# Patient Record
Sex: Male | Born: 1990 | Race: Black or African American | Hispanic: No | Marital: Single | Smoking: Former smoker
Health system: Southern US, Community
[De-identification: ages and names within clinical notes are randomized; demographics above are authoritative.]

## PROBLEM LIST (undated history)

## (undated) DIAGNOSIS — F329 Major depressive disorder, single episode, unspecified: Secondary | ICD-10-CM

## (undated) DIAGNOSIS — M90512 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, left shoulder: Secondary | ICD-10-CM

## (undated) DIAGNOSIS — D571 Sickle-cell disease without crisis: Secondary | ICD-10-CM

## (undated) DIAGNOSIS — F32A Depression, unspecified: Secondary | ICD-10-CM

## (undated) DIAGNOSIS — E559 Vitamin D deficiency, unspecified: Secondary | ICD-10-CM

## (undated) HISTORY — PX: CHOLECYSTECTOMY: SHX55

---
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
## 2011-04-22 DIAGNOSIS — D582 Other hemoglobinopathies: Secondary | ICD-10-CM | POA: Insufficient documentation

## 2011-04-22 DIAGNOSIS — T8089XA Other complications following infusion, transfusion and therapeutic injection, initial encounter: Secondary | ICD-10-CM | POA: Insufficient documentation

## 2012-12-17 ENCOUNTER — Telehealth: Payer: Self-pay

## 2012-12-17 NOTE — Telephone Encounter (Signed)
This CM spoke with Mr. Timothy Marks regarding the status of his request to become a new patient. Mr. Timothy Marks stated he does not have a PCP and needs a PCP. Mr. Timothy Marks states he has the new pt packet from 07/2012 and will complete  and send it to our office.   Karoline Caldwell, RN, BSN, [place]   [phone]

## 2013-01-24 ENCOUNTER — Inpatient Hospital Stay (HOSPITAL_COMMUNITY)
Admission: EM | Admit: 2013-01-24 | Discharge: 2013-01-25 | DRG: 812 | Disposition: A | Discharge status: Home / Self Care | Payer: Medicaid Other | Attending: Internal Medicine | Admitting: Internal Medicine

## 2013-01-24 ENCOUNTER — Encounter (HOSPITAL_COMMUNITY): Payer: Self-pay | Admitting: Emergency Medicine

## 2013-01-24 ENCOUNTER — Emergency Department (HOSPITAL_COMMUNITY): Payer: Medicaid Other

## 2013-01-24 DIAGNOSIS — D5700 Hb-SS disease with crisis, unspecified: Principal | ICD-10-CM | POA: Diagnosis present

## 2013-01-24 DIAGNOSIS — R4689 Other symptoms and signs involving appearance and behavior: Secondary | ICD-10-CM | POA: Diagnosis present

## 2013-01-24 HISTORY — DX: Sickle-cell disease without crisis: D57.1

## 2013-01-24 LAB — CBC WITH DIFFERENTIAL/PLATELET
Basophils Absolute: 0 10*3/uL (ref 0.0–0.1)
Basophils Relative: 0 % (ref 0–1)
Eosinophils Absolute: 0.4 10*3/uL (ref 0.0–0.7)
Eosinophils Relative: 3 % (ref 0–5)
HCT: 28 % — ABNORMAL LOW (ref 39.0–52.0)
Hemoglobin: 10.2 g/dL — ABNORMAL LOW (ref 13.0–17.0)
Lymphocytes Relative: 19 % (ref 12–46)
Lymphs Abs: 2.4 10*3/uL (ref 0.7–4.0)
MCH: 27.6 pg (ref 26.0–34.0)
MCHC: 36.4 g/dL — ABNORMAL HIGH (ref 30.0–36.0)
MCV: 75.7 fL — ABNORMAL LOW (ref 78.0–100.0)
Monocytes Absolute: 1.3 10*3/uL — ABNORMAL HIGH (ref 0.1–1.0)
Monocytes Relative: 10 % (ref 3–12)
Neutro Abs: 8.5 10*3/uL — ABNORMAL HIGH (ref 1.7–7.7)
Neutrophils Relative %: 68 % (ref 43–77)
Platelets: 386 10*3/uL (ref 150–400)
RBC: 3.7 MIL/uL — ABNORMAL LOW (ref 4.22–5.81)
RDW: 22.5 % — ABNORMAL HIGH (ref 11.5–15.5)
WBC: 12.6 10*3/uL — ABNORMAL HIGH (ref 4.0–10.5)

## 2013-01-24 LAB — COMPREHENSIVE METABOLIC PANEL
ALT: 18 U/L (ref 0–53)
AST: 44 U/L — ABNORMAL HIGH (ref 0–37)
Albumin: 4.3 g/dL (ref 3.5–5.2)
Alkaline Phosphatase: 74 U/L (ref 39–117)
BUN: 9 mg/dL (ref 6–23)
CO2: 25 mEq/L (ref 19–32)
Calcium: 9.2 mg/dL (ref 8.4–10.5)
Chloride: 102 mEq/L (ref 96–112)
Creatinine, Ser: 0.63 mg/dL (ref 0.50–1.35)
GFR calc Af Amer: >90 mL/min (ref >90)
GFR calc non Af Amer: >90 mL/min (ref >90)
Glucose, Bld: 89 mg/dL (ref 70–99)
Potassium: 4.5 mEq/L (ref 3.5–5.1)
Sodium: 136 mEq/L (ref 135–145)
Total Bilirubin: 4.7 mg/dL — ABNORMAL HIGH (ref 0.3–1.2)
Total Protein: 7.8 g/dL (ref 6.0–8.3)

## 2013-01-24 LAB — URINE MICROSCOPIC-ADD ON

## 2013-01-24 LAB — URINALYSIS, ROUTINE W REFLEX MICROSCOPIC
Bilirubin Urine: NEGATIVE
Glucose, UA: NEGATIVE mg/dL
Ketones, ur: NEGATIVE mg/dL
Leukocytes, UA: NEGATIVE
Nitrite: NEGATIVE
Protein, ur: NEGATIVE mg/dL
Specific Gravity, Urine: 1.007 (ref 1.005–1.030)
Urobilinogen, UA: 1 mg/dL (ref 0.0–1.0)
pH: 6.5 (ref 5.0–8.0)

## 2013-01-24 LAB — RETICULOCYTES
RBC.: 3.7 MIL/uL — ABNORMAL LOW (ref 4.22–5.81)
Retic Count, Absolute: 307.1 10*3/uL — ABNORMAL HIGH (ref 19.0–186.0)
Retic Ct Pct: 8.3 % — ABNORMAL HIGH (ref 0.4–3.1)

## 2013-01-24 LAB — TSH: TSH: 2.299 u[IU]/mL (ref 0.350–4.500)

## 2013-01-24 MED ORDER — FOLIC ACID 1 MG PO TABS
1.0000 mg | ORAL_TABLET | Freq: Every day | ORAL | Status: DC
  Administered 2013-01-24: 1 mg via ORAL
  Filled 2013-01-24 (×2): qty 1

## 2013-01-24 MED ORDER — OXYCODONE-ACETAMINOPHEN 5-325 MG PO TABS
2.0000 | ORAL_TABLET | ORAL | Status: DC | PRN
  Filled 2013-01-24: qty 2

## 2013-01-24 MED ORDER — ONDANSETRON HCL 4 MG PO TABS: 4.0000 mg | ORAL_TABLET | Freq: Four times a day (QID) | ORAL | Status: DC | PRN

## 2013-01-24 MED ORDER — SODIUM CHLORIDE 0.9 % IV BOLUS (SEPSIS)
1000.0000 mL | Freq: Once | INTRAVENOUS | Status: AC
  Administered 2013-01-24: 1000 mL via INTRAVENOUS

## 2013-01-24 MED ORDER — ONDANSETRON HCL 4 MG/2ML IJ SOLN: 4.0000 mg | Freq: Four times a day (QID) | INTRAMUSCULAR | Status: DC | PRN

## 2013-01-24 MED ORDER — HYDROMORPHONE HCL PF 1 MG/ML IJ SOLN
1.0000 mg | Freq: Once | INTRAMUSCULAR | Status: AC
  Administered 2013-01-24: 1 mg via INTRAVENOUS
  Filled 2013-01-24: qty 1

## 2013-01-24 MED ORDER — SODIUM CHLORIDE 0.9 % IV SOLN
25.0000 mg | INTRAVENOUS | Status: DC | PRN
  Filled 2013-01-24: qty 0.5

## 2013-01-24 MED ORDER — HYDROMORPHONE HCL PF 1 MG/ML IJ SOLN
1.0000 mg | INTRAMUSCULAR | Status: DC | PRN
  Administered 2013-01-24: 1 mg via INTRAVENOUS
  Filled 2013-01-24: qty 1

## 2013-01-24 MED ORDER — SODIUM CHLORIDE 0.9 % IV SOLN: INTRAVENOUS | Status: DC

## 2013-01-24 MED ORDER — ONDANSETRON HCL 4 MG/2ML IJ SOLN
4.0000 mg | Freq: Once | INTRAMUSCULAR | Status: AC
  Administered 2013-01-24: 4 mg via INTRAVENOUS
  Filled 2013-01-24: qty 2

## 2013-01-24 MED ORDER — ONDANSETRON HCL 4 MG/2ML IJ SOLN: 4.0000 mg | Freq: Three times a day (TID) | INTRAMUSCULAR | Status: DC | PRN

## 2013-01-24 MED ORDER — DIPHENHYDRAMINE HCL 12.5 MG/5ML PO ELIX
12.5000 mg | ORAL_SOLUTION | Freq: Four times a day (QID) | ORAL | Status: DC | PRN
  Filled 2013-01-24: qty 5

## 2013-01-24 MED ORDER — DIPHENHYDRAMINE HCL 50 MG/ML IJ SOLN: 12.5000 mg | Freq: Four times a day (QID) | INTRAMUSCULAR | Status: DC | PRN

## 2013-01-24 MED ORDER — SODIUM CHLORIDE 0.9 % IJ SOLN: 9.0000 mL | INTRAMUSCULAR | Status: DC | PRN

## 2013-01-24 MED ORDER — HYDROMORPHONE HCL PF 2 MG/ML IJ SOLN
2.0000 mg | Freq: Once | INTRAMUSCULAR | Status: AC
  Administered 2013-01-24: 2 mg via INTRAVENOUS
  Filled 2013-01-24: qty 1

## 2013-01-24 MED ORDER — SODIUM CHLORIDE 0.9 % IV SOLN
Freq: Once | INTRAVENOUS | Status: AC
Start: 2013-01-24 — End: 2013-01-24
  Administered 2013-01-24: 13:00:00 via INTRAVENOUS

## 2013-01-24 MED ORDER — OXYCODONE-ACETAMINOPHEN 5-325 MG PO TABS
2.0000 | ORAL_TABLET | Freq: Once | ORAL | Status: AC
  Administered 2013-01-24: 2 via ORAL

## 2013-01-24 MED ORDER — NALOXONE HCL 0.4 MG/ML IJ SOLN: 0.4000 mg | INTRAMUSCULAR | Status: DC | PRN

## 2013-01-24 MED ORDER — PROMETHAZINE HCL 25 MG/ML IJ SOLN: 25.0000 mg | Freq: Once | INTRAMUSCULAR | Status: DC

## 2013-01-24 MED ORDER — HYDROMORPHONE 0.3 MG/ML IV SOLN
INTRAVENOUS | Status: DC
  Administered 2013-01-24: 20:00:00 via INTRAVENOUS
  Administered 2013-01-25: 2.19 mg via INTRAVENOUS
  Administered 2013-01-25: 0.799 mg via INTRAVENOUS
  Filled 2013-01-24: qty 25

## 2013-01-24 MED ORDER — SODIUM CHLORIDE 0.45 % IV SOLN
INTRAVENOUS | Status: DC
  Administered 2013-01-24: 1000 mL via INTRAVENOUS
  Administered 2013-01-25: via INTRAVENOUS

## 2013-01-24 MED ORDER — ENOXAPARIN SODIUM 40 MG/0.4ML ~~LOC~~ SOLN
40.0000 mg | SUBCUTANEOUS | Status: DC
  Administered 2013-01-24: 40 mg via SUBCUTANEOUS
  Filled 2013-01-24 (×2): qty 0.4

## 2013-01-24 NOTE — Progress Notes (Signed)
NURSING PROGRESS NOTE  [name] [id number] Admission Data: 01/24/2013 5:59 PM Attending Provider: Kathlen Mody, MD PCP:No primary provider on file. Code Status: full  Timothy Marks is a 22 y.o. male patient admitted from ED:  -No acute distress noted.  -No complaints of shortness of breath.  -No complaints of chest pain.    Blood pressure 132/67, pulse 77, temperature 98.4 F (36.9 C), temperature source Oral, resp. rate 20, height 6\' 2"  (1.88 m), weight 58.695 kg (129 lb 6.4 oz), SpO2 94.00%.   IV Fluids:  IV in place, occlusive dsg intact without redness, IV cath upper arm right, condition patent and no redness 1/2 normal saline @125 .   Allergies:  Toradol and Tramadol  Past Medical History:   has a past medical history of Sickle cell anemia.  Past Surgical History:   has no past surgical history on file.  Social History:   reports that he has never smoked. He has never used smokeless tobacco. He reports that he does not drink alcohol or use illicit drugs.  Skin: tattoos, cold sore on lip  Patient/Family orientated to room. Information packet given to patient/family. Admission inpatient armband information verified with patient/family to include name and date of birth and placed on patient arm. Side rails up x 2, fall assessment and education completed with patient/family. Patient/family able to verbalize understanding of risk associated with falls and verbalized understanding to call for assistance before getting out of bed. Call light within reach. Patient/family able to voice and demonstrate understanding of unit orientation instructions.    Will continue to evaluate and treat per MD orders.

## 2013-01-24 NOTE — ED Notes (Signed)
Family at bedside. 

## 2013-01-24 NOTE — Progress Notes (Signed)
[facility] hospital[facility] progress note. Chief complaint. Chest pain. History of present illness. This 22 year old male admitted with sickle cell crisis. No chest pain at time of admission. On arrival to the floor he complained of central chest pain to nursing. There is no associated diaphoresis, radiation of the pain, or associated nausea. Patient denies any prior known history of coronary artery disease. A 12-lead EKG was obtained the bedside notes normal sinus rhythm without indication of ischemia. Patient does indicate that chest pain is not uncommon with his sickle cell crisis. Vital signs. Temperature 98.4, pulse 79, respiration 18, blood pressure 120/68. O2 sats 98%. General appearance. Thin young male who is alert, cooperative, in no distress. Cardiac. Rate and rhythm regular. Lungs. Breath sounds clear and equal. Abdomen. Soft with positive bowel sounds. No pain. Musculoskeletal. Reproducible chest pain with palpation over the sternal area. Impression/plan. Problem #1. Chest pain. I suspect this is musculoskeletal/sickle cell type pain rather than pain of cardiac origin. Patient's EKG looks unremarkable. There is no indication of ischemia seen. Will continue with ordered analgesics for sickle cell crisis. Will check a troponin now and then every 6 hours for a total of 3 sets and follow for these results.

## 2013-01-24 NOTE — ED Provider Notes (Signed)
CSN: [id number]     Arrival date & time 01/24/13  4098 History   First MD Initiated Contact with Patient 01/24/13 [phone]     Chief Complaint  Patient presents with  . Sickle Cell Pain Crisis   (Consider location/radiation/quality/duration/timing/severity/associated sxs/prior Treatment) HPI Comments: The patient is a 22 year old male with a past medical history of sickle cell anemia that presents to the ED with sickle cell pain since last night. The patient reports 10/10 pain. He reports a gradual onset of upper extremity, bilateral lower extremity pain, and back pain. The patient states similar to sickle cell pain in the past. He reports ibuprofen and warm compresses with minimal relief. Last hospitalization was October 2014 in [facility].  The patient is unsure what his baseline Hbg is.  He denies chest pain, cough dyspnea, fever or chills, abdominal pain, nausea or vomiting.  Reports [place] is managed by "[facility]".  The history is provided by the patient and medical records. No language interpreter was used.    Past Medical History  Diagnosis Date  . Sickle cell anemia    History reviewed. No pertinent past surgical history. No family history on file. History  Substance Use Topics  . Smoking status: Never Smoker   . Smokeless tobacco: Not on file  . Alcohol Use: No    Review of Systems  Constitutional: Negative for fever and chills.  Respiratory: Negative for cough, chest tightness, shortness of breath and wheezing.   Cardiovascular: Negative for chest pain, palpitations and leg swelling.  Gastrointestinal: Negative for nausea, abdominal pain, diarrhea and constipation.  Musculoskeletal: Positive for arthralgias, back pain and joint swelling.  Skin: Negative for rash.  Neurological: Negative for light-headedness and headaches.  All other systems reviewed and are negative.    Allergies  Toradol and Tramadol  Home Medications   Current Outpatient Rx   Name  Route  Sig  Dispense  Refill  . folic acid (FOLVITE) 1 MG tablet   Oral   Take 1 mg by mouth daily.         . hydroxyurea (HYDREA) 500 MG capsule   Oral   Take 500 mg by mouth daily. May take with food to minimize GI side effects.         Marland Kitchen oxyCODONE-acetaminophen (PERCOCET) 10-325 MG per tablet   Oral   Take 1 tablet by mouth every 4 (four) hours as needed for pain.          BP 127/62  Pulse 81  Temp(Src) 98 F (36.7 C) (Oral)  Resp 22  SpO2 96% Physical Exam  Nursing note and vitals reviewed. Constitutional: He is oriented to person, place, and time. He appears well-developed and well-nourished.  HENT:  Head: Normocephalic and atraumatic.  Eyes: Scleral icterus is present.  Neck: Normal range of motion. Neck supple.  Cardiovascular: Normal rate and regular rhythm.   Pulmonary/Chest: Effort normal and breath sounds normal. No respiratory distress. He has no wheezes. He has no rales. He exhibits no tenderness.  Abdominal: Soft. Bowel sounds are normal. He exhibits no distension. There is no tenderness. There is no rebound and no guarding.  Well healed small midsagittal linear scar and umbilical scar.  Musculoskeletal: Normal range of motion.  Upper extremity pain with palpation, bilateral knees and lower extremities tender to palpation.  Low back tender to palpation.  Neurological: He is alert and oriented to person, place, and time.  Skin: Skin is warm and dry. No  rash noted.    ED Course  Procedures (including critical care time) Labs Review Labs Reviewed  CBC WITH DIFFERENTIAL - Abnormal; Notable for the following:    WBC 12.6 (*)    RBC 3.70 (*)    Hemoglobin 10.2 (*)    HCT 28.0 (*)    MCV 75.7 (*)    MCHC 36.4 (*)    RDW 22.5 (*)    Neutro Abs 8.5 (*)    Monocytes Absolute 1.3 (*)    All other components within normal limits  COMPREHENSIVE METABOLIC PANEL - Abnormal; Notable for the following:    AST 44 (*)    Total Bilirubin 4.7 (*)    All  other components within normal limits  RETICULOCYTES - Abnormal; Notable for the following:    Retic Ct Pct 8.3 (*)    RBC. 3.70 (*)    Retic Count, Manual 307.1 (*)    All other components within normal limits   Imaging Review Dg Chest 2 View  01/24/2013   CLINICAL DATA:  Pain; sickle cell disease  EXAM: CHEST  2 VIEW  COMPARISON:  January 20, 2013  FINDINGS: Lungs are clear. Heart size and pulmonary vascularity are normal. No adenopathy. No bone lesions.  IMPRESSION: No abnormality noted.   Electronically Signed   By: Bretta Bang M.D.   On: 01/24/2013 11:00    EKG Interpretation   None       MDM   1. Sickle cell anemia with pain    Pt with a history of [place] complains of arthralgias, denies Acute chest symptoms. Labs, fluid, pain medication ordered.  Per EMR at outside records: Baseline Hgb: 9.5 g/dl Baseline WBC: 40,981 Baseline reticulocyte count: 9%  Re-eval: patient texting and watching videos on cell phone.  Requesting more pain medication, oxygen saturation 95% while evaluating the patient. No acute abnormality on Chest-XR.  Re-eval patient is talking on the phone in the room discussed XR results with patient.  Patient is resting comfortably in room watching videos on his phone requesting gatorade.  He states he is still having pain in his back and extremities. He is requesting to be admitted for his pain at this time.  Per [facility] records the patient is no longer able to receive Schedule II medications due to testing positive for marijuana on a urine drug screen.  Hospitalist to admit for further management of his pain.   Meds given in ED:  Medications  diphenhydrAMINE (BENADRYL) 25 mg in sodium chloride 0.9 % 50 mL IVPB (not administered)  sodium chloride 0.9 % bolus 1,000 mL (0 mLs Intravenous Stopped 01/24/13 1019)  HYDROmorphone (DILAUDID) injection 2 mg (2 mg Intravenous Given 01/24/13 0936)  ondansetron (ZOFRAN) injection 4 mg (4 mg Intravenous Given 01/24/13 0937)   sodium chloride 0.9 % bolus 1,000 mL (0 mLs Intravenous Stopped 01/24/13 1236)  HYDROmorphone (DILAUDID) injection 1 mg (1 mg Intravenous Given 01/24/13 1134)  sodium chloride 0.9 % bolus 1,000 mL (0 mLs Intravenous Stopped 01/24/13 1234)  HYDROmorphone (DILAUDID) injection 1 mg (1 mg Intravenous Given 01/24/13 1233)  0.9 %  sodium chloride infusion ( Intravenous Stopped 01/24/13 1347)    New Prescriptions   No medications on file      Clabe Seal, PA-C 01/24/13 1434

## 2013-01-24 NOTE — H&P (Signed)
[facility] History and Physical  Normand Damron WUJ:[id number] DOB: September 17, 1990 DOA: 01/24/2013  Referring physician: EDP PCP: No primary provider on file.   Chief Complaint: lower extremity pain since yesterday.   HPI: Timothy Marks is a 22 y.o. male with h/o sickle cell anemia, used to follow up at [facility], until 2 months ago, wanted to find PCP in [place], went to [facility] and filled out paperwork to get in to clinic 3 weeks ago, came in today for worsening lower extremity pain snce yesterday, associated with pain in the lower back and arms, . No chest pain, sob, cough, fever or chills or syncope, headache or dizziness. His pain was not controlled with dilaudid in ED, and he was referred to admission to medical service for management of sickle cell pain crisis.   Review of Systems: Systems reviewed.  As above, otherwise negative  Past Medical History  Diagnosis Date  . Sickle cell anemia    History reviewed. No pertinent past surgical history. Social History:  reports that he has never smoked. He does not have any smokeless tobacco history on file. He reports that he does not drink alcohol or use illicit drugs.  Allergies  Allergen Reactions  . Toradol [Ketorolac Tromethamine] Itching  . Tramadol Hives    No family history on file.   Prior to Admission medications   Medication Sig Start Date End Date Taking? Authorizing Provider  folic acid (FOLVITE) 1 MG tablet Take 1 mg by mouth daily.   Yes Historical Provider, MD  hydroxyurea (HYDREA) 500 MG capsule Take 500 mg by mouth daily. May take with food to minimize GI side effects.   Yes Historical Provider, MD  oxyCODONE-acetaminophen (PERCOCET) 10-325 MG per tablet Take 1 tablet by mouth every 4 (four) hours as needed for pain.   Yes Historical Provider, MD   Physical Exam: Filed Vitals:   01/24/13 1549  BP: 132/67  Pulse: 77  Temp: 98.4 F (36.9 C)  Resp: 20    BP 132/67  Pulse 77  Temp(Src)  98.4 F (36.9 C) (Oral)  Resp 20  Ht 6\' 2"  (1.88 m)  Wt 58.695 kg (129 lb 6.4 oz)  BMI 16.61 kg/m2  SpO2 94%           Constitutional: Vital signs reviewed.  Patient is a well-developed and well-nourished in no acute distress and cooperative with exam. Alert and oriented x3.  Head: Normocephalic and atraumatic  Mouth: no erythema or exudates, MMM Eyes: PERRL, EOMI, conjunctivae normal, No scleral icterus.  Neck: Supple, Trachea midline normal ROM, No JVD, mass, thyromegaly, or carotid bruit present.  Cardiovascular: RRR, S1 normal, S2 normal, no MRG, pulses symmetric and intact bilaterally Pulmonary/Chest: normal respiratory effort, CTAB, no wheezes, rales, or rhonchi Abdominal: Soft. Non-tender, non-distended, bowel sounds are normal, no masses, organomegaly, or guarding present.  Musculoskeletal: No joint deformities, erythema, or stiffness, ROM painful  and tender Neurological: A&O x3, Strength is normal and symmetric bilaterally, cranial nerve II-XII are grossly intact, no focal motor deficit, sensory intact to light touch bilaterally.  Skin: Warm, dry and intact. No rash, cyanosis, or clubbing.  Psychiatric: Normal mood and affect. speech and behavior is normal.  Labs on Admission:  Basic Metabolic Panel:  Recent Labs Lab 01/24/13 0924  NA 136  K 4.5  CL 102  CO2 25  GLUCOSE 89  BUN 9  CREATININE 0.63  CALCIUM 9.2   Liver Function Tests:  Recent Labs Lab 01/24/13 0924  AST 44*  ALT 18  ALKPHOS 74  BILITOT 4.7*  PROT 7.8  ALBUMIN 4.3   No results found for this basename: LIPASE, AMYLASE,  in the last 168 hours No results found for this basename: AMMONIA,  in the last 168 hours CBC:  Recent Labs Lab 01/24/13 0924  WBC 12.6*  NEUTROABS 8.5*  HGB 10.2*  HCT 28.0*  MCV 75.7*  PLT 386   Cardiac Enzymes: No results found for this basename: CKTOTAL, CKMB, CKMBINDEX, TROPONINI,  in the last 168 hours  BNP (last 3 results) No results found for this  basename: PROBNP,  in the last 8760 hours CBG: No results found for this basename: GLUCAP,  in the last 168 hours  Radiological Exams on Admission: Dg Chest 2 View  01/24/2013   CLINICAL DATA:  Pain; sickle cell disease  EXAM: CHEST  2 VIEW  COMPARISON:  January 20, 2013  FINDINGS: Lungs are clear. Heart size and pulmonary vascularity are normal. No adenopathy. No bone lesions.  IMPRESSION: No abnormality noted.   Electronically Signed   By: Bretta Bang M.D.   On: 01/24/2013 11:00    EKG: not done  Assessment/Plan Active Problems:   Sickle cell anemia with pain   Sickle cell pain crisis   Sickle cell pain crisis: - admitted to med surg bed  - started pt on IV fluids, anti emetics, and PCA dilaudid.    Leukocytosis: - CXR neg for infection. - UA ordered.   Anemia: his H&h is 10.2. Will hold hydroxyurea .    DVT prophylaxis.   Code Status: full code Family Communication: discussed the plan of care with pt. Disposition Plan: admitted to inpatient  Time spent: 38  [facility] Pager [phone]

## 2013-01-24 NOTE — ED Provider Notes (Signed)
Medical screening examination/treatment/procedure(s) were conducted as a shared visit with non-physician practitioner(s) and myself.  I personally evaluated the patient during the encounter.  Typical sickle cell pain in arms, legs, low back.  No fever, chest pain, SOB.   FROM joints without pain . No warmth or erythema.  EKG Interpretation   None         Glynn Octave, MD 01/24/13 1529

## 2013-01-24 NOTE — Progress Notes (Signed)
Patient reported that he was having pain near his heart area. RN went in and assessed pain- pt pointed to his mid chest and stated that it was hurting there. RN called NP, Claiborne Billings who ordered a STAT ekg be done. Will continue monitor patient

## 2013-01-24 NOTE — ED Notes (Signed)
Patient states sickle cell crisis x 1 day, patient states generalized body aches and pain

## 2013-01-25 DIAGNOSIS — F6089 Other specific personality disorders: Secondary | ICD-10-CM | POA: Insufficient documentation

## 2013-01-25 DIAGNOSIS — F603 Borderline personality disorder: Secondary | ICD-10-CM

## 2013-01-25 DIAGNOSIS — R4689 Other symptoms and signs involving appearance and behavior: Secondary | ICD-10-CM | POA: Diagnosis present

## 2013-01-25 LAB — TROPONIN I: Troponin I: <0.3 ng/mL (ref <0.30)

## 2013-01-25 MED ORDER — OXYCODONE-ACETAMINOPHEN 5-325 MG PO TABS
1.0000 | ORAL_TABLET | ORAL | Status: DC | PRN
  Administered 2013-01-25: 1 via ORAL
  Filled 2013-01-25: qty 1

## 2013-01-25 MED ORDER — OXYCODONE-ACETAMINOPHEN 10-325 MG PO TABS: 1.0000 | ORAL_TABLET | ORAL | Status: DC | PRN

## 2013-01-25 MED ORDER — HYDROXYUREA 500 MG PO CAPS: 500.0000 mg | ORAL_CAPSULE | Freq: Every day | ORAL | Status: DC

## 2013-01-25 MED ORDER — OXYCODONE HCL 5 MG PO TABS
5.0000 mg | ORAL_TABLET | ORAL | Status: DC | PRN
  Administered 2013-01-25: 5 mg via ORAL
  Filled 2013-01-25 (×2): qty 1

## 2013-01-25 NOTE — Progress Notes (Signed)
I was present while primary nurse went over discharge paperwork. Patient refused to listen to instructions and was only concerned on how many pills he was getting of narcotics. After he was given the prescription he demanded to see his doctor. Dr Arthor Captain notified and seen patient. Advised patient that he needs a primary care physician in order to continue to receive his pain medications at home. Patient has had sickle cell since age 22 per patient. States he is in the middle of changing doctors that manage his sickle cell. Madelin Rear, MSN, RN, [employer]

## 2013-01-25 NOTE — Progress Notes (Signed)
Patient stated to RN that he did want to be in a room that had cameras. Patient stated that cameras made him feel uncomfortable and he did not want to be watched by any of the staff. RN reassured patient that no one would be looking at him 24/7 and that camera rooms were usually used for safety reasons. Patient asked if he could get something to cover the cameras with but RN told patient that was not appropriate. Will continue to monitor.
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
## 2013-01-25 NOTE — Progress Notes (Signed)
Upon pain assessment pt stated 9 as pain level. Pt appeared well rested with girlfriend on the bedside. Pt was asleep while Clinical research associate of this note did some rounds an hour ago. Will continue to monitor.

## 2013-01-25 NOTE — Clinical Social Work Note (Signed)
Clinical Social Worker received referral stating that patient would need clothes and transportation for discharge.  Upon CSW visit, patient had already been discharged and was able to get clothes and transportation arranged.    Clinical Social Worker will sign off for now as social work intervention is no longer needed.  Macario Golds, [place] [phone]

## 2013-01-25 NOTE — Progress Notes (Signed)
Pt refused care
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
## 2013-01-25 NOTE — Clinical Documentation Improvement (Signed)
THIS DOCUMENT IS NOT A PERMANENT PART OF THE MEDICAL RECORD  Please update your documentation with the medical record to reflect your response to this query. If you need help knowing how to do this please call [phone].  01/25/13  Dear Ms. York PA,   Noted this patient's BMI is 16.6. Please address in Notes and DC summary to illustrate severity of illness and risk of mortality. Thank you.  Possible Clinical conditions - Underweight: BMI < 19.0  Reviewed:  no additional documentation provided  Thank You,  Beverley Fiedler  Clinical Documentation Specialist: [phone] Health Information Management [place]

## 2013-01-25 NOTE — Progress Notes (Signed)
3RNs went into pt room and told pt that what they are doing is not tolerated in this hospital. Pt said he will go home, RN brought in [facility] form and was about to d/c IV, pt tried to and successfully disconnect his IV himself. Pt and girlfriend were having arguments as to how to go home. Pt suddenly decided to stay in the hospital so he can get his pain meds and he was yelling that his privacy had been violated. [facility] nurse explained that he was in a room that had a camera, pointed out that there was a sign in the room- that is it being monitored. Pt was given time to be himself alone, to calm him down. [place],NP came up in the floor and informed RN that he would be putting patient on percocet for now. PCA at bedside but currently disconnected. Will continue to monitor

## 2013-01-25 NOTE — Discharge Summary (Signed)
Physician Discharge Summary  Timothy Marks ZOX:[id number] DOB: 22-Jun-1990 DOA: 01/24/2013  PCP: No primary provider on file.  Admit date: 01/24/2013 Discharge date: 01/25/2013  Time spent: 45 minutes  Recommendations for Outpatient Follow-up:  Patient is making arrangements to be seen at the sickle cell clinic.    Discharge Diagnoses:  Active Problems:   Sickle cell anemia with pain   Sickle cell pain crisis   Discharge Condition: stable for discharge to his mother's home.  He has no acute need for hospitalization.  Diet recommendation: regular diet  Filed Weights   01/24/13 1549  Weight: 58.695 kg (129 lb 6.4 oz)    History of present illness:  Timothy Marks is a 21 y.o. male from [place], [place] with h/o sickle cell anemia, used to follow up at [facility], until 2 months ago, wanted to find PCP in , went to [facility] sickle cell clinic and filled out paperwork to get in to clinic 3 weeks ago, came in today for worsening lower extremity pain snce yesterday, associated with pain in the lower back and arms, . No chest pain, sob, cough, fever or chills or syncope, headache or dizziness. His pain was not controlled with dilaudid in ED, and he was referred to admission to medical service for management of sickle cell pain crisis.    Hospital Course:  Sickle Cell Pain Crisis. Timothy Marks was admitted to a med surg floor on the evening of 12/11.  He was started on IV fluids, anti-emetics, and a dilaudid PCA.  The patient disconnected his own PCA dilaudid pump and was able to manage his pain on oral pain medications on the morning of 12/12. Troponin was within normal limits. Chest xray is clear.  Labs were essentially at baseline (Hgb 10.2, retic count 307) when compared to notes from his recent hospitalization at [facility].   Patient is able to ambulate about the room and tolerate food without difficulty.  He was discharged to his mothers home.   Social. Timothy Marks complained of being  in a camera room.  Later he was noted to be having sexual intercourse with his giralfriend in his room.  He became upset when he was told by the RN that this was not appropriate behavior in the hospital.  He disconnected his PCA pump and wanted to be discharged.  On the morning of 12/12, when I examined the patient he was very angry that he was being discharged complaining that he had no pain medicine and he did not want to go live with his mother.  After I examined him and told him I felt he was ready for discharge,  he very clearly indicated that he expected to be discharged on oxy 10-325.  After he received his prescription he demanded to see the Doctor because he felt that his prescription did not have enough pills.  We explained to Timothy Marks that he is responsible to find a primary care physician that will care for him as an outpatient. He was escorted out by security as he was behaving inappropriately.   Discharge Exam: Filed Vitals:   01/25/13 0400  BP:   Pulse:   Temp:   Resp: 14    General: Young, thin, male, with swollen lower lip, appears angry, speaking to me in a raised voice. Cardiovascular: rrr, no m/r/g Respiratory: cta Abdomen:  Thin, nt, nd, +bs Extremities:  5/5 strength in each, patient complaining of pain in both LE with palpation, but able to walk without difficulty.  Discharge  Instructions      Discharge Orders   Future Orders Complete By Expires   Diet - low sodium heart healthy  As directed    Increase activity slowly  As directed        Medication List         folic acid 1 MG tablet  Commonly known as:  FOLVITE  Take 1 mg by mouth daily.     hydroxyurea 500 MG capsule  Commonly known as:  HYDREA  Take 1 capsule (500 mg total) by mouth daily. May take with food to minimize GI side effects.     oxyCODONE-acetaminophen 10-325 MG per tablet  Commonly known as:  PERCOCET  Take 1 tablet by mouth every 4 (four) hours as needed for pain.        Allergies  Allergen Reactions  . Toradol [Ketorolac Tromethamine] Itching  . Tramadol Hives      The results of significant diagnostics from this hospitalization (including imaging, microbiology, ancillary and laboratory) are listed below for reference.    Significant Diagnostic Studies: Dg Chest 2 View  01/24/2013   CLINICAL DATA:  Pain; sickle cell disease  EXAM: CHEST  2 VIEW  COMPARISON:  January 20, 2013  FINDINGS: Lungs are clear. Heart size and pulmonary vascularity are normal. No adenopathy. No bone lesions.  IMPRESSION: No abnormality noted.   Electronically Signed   By: Bretta Bang M.D.   On: 01/24/2013 11:00    Labs: Basic Metabolic Panel:  Recent Labs Lab 01/24/13 0924  NA 136  K 4.5  CL 102  CO2 25  GLUCOSE 89  BUN 9  CREATININE 0.63  CALCIUM 9.2   Liver Function Tests:  Recent Labs Lab 01/24/13 0924  AST 44*  ALT 18  ALKPHOS 74  BILITOT 4.7*  PROT 7.8  ALBUMIN 4.3   CBC:  Recent Labs Lab 01/24/13 0924  WBC 12.6*  NEUTROABS 8.5*  HGB 10.2*  HCT 28.0*  MCV 75.7*  PLT 386   Cardiac Enzymes:  Recent Labs Lab 01/25/13 0006  TROPONINI <0.30      Signed:  Conley Canal [phone]  [facility] 01/25/2013, 11:42 AM   Addendum  Patient seen and examined, chart and data base reviewed.  I agree with the above assessment and plan.  For full details please see Mrs. Algis Downs PA note.  With reported sickle cell anemia who gets his care usually at [facility].  Patient was recently escorted out of [facility] on 12/05/2012 after he threatened the attending physician as well as the nursing staff.  His PCA was discontinued by the nursing staff after he was found to have sexual intercourse with his girlfriend in the hospital bed.  Patient was told that he is in a camera room, he was very upset that his privacy was violated. The head nurse told him about the camera  were then wants prior to his misbehavior.   Patient does not want to stay in the hospital, prescription of Percocet 10/325 and worth of 30 pills was given to the patient.  Patient was yelling and screaming, security and he is a police was called to escort patient out of the hospital.  Asked patient to followup with sickle cell clinic, continue his medications including folic acid/hydroxyurea.    Clint Lipps, MD [facility] Pager: [phone] 01/25/2013, 12:32 PM

## 2013-01-25 NOTE — Progress Notes (Signed)
Patient requested pain medication. Gave him his oxy ir and percocet to equal 10mg . Patient was asked to let me remove his IV and he refused. Stated if he wanted his IV out he would remove it himself. Informed primary nurse of his comments. Madelin Rear, MSN, RN, [employer]

## 2013-01-25 NOTE — Care Management Note (Signed)
    Page 1 of 1   01/25/2013     11:14:35 AM   CARE MANAGEMENT NOTE 01/25/2013  Patient:  Timothy Marks, Timothy Marks   Account Number:  [id number]  Date Initiated:  01/25/2013  Documentation initiated by:  Letha Cape  Subjective/Objective Assessment:   dx sickle cell crisis  admit from home.     Action/Plan:   Anticipated DC Date:  01/25/2013   Anticipated DC Plan:  HOME/SELF CARE      DC Planning Services  CM consult      Choice offered to / List presented to:             Status of service:  Completed, signed off Medicare Important Message given?   (If response is "NO", the following Medicare IM given date fields will be blank) Date Medicare IM given:   Date Additional Medicare IM given:    Discharge Disposition:  HOME/SELF CARE  Per UR Regulation:  Reviewed for med. necessity/level of care/duration of stay  If discussed at Long Length of Stay Meetings, dates discussed:    Comments:  01/25/13 11:08 Letha Cape RN, BSN [phone] patient from home, patient here with ss crisis, patient went over to sickel cell clinic and filled out paperwork three weeks ago.  Patient now here at hospital with security by room for behaviral reasons.  Patient dc'd to home.

## 2013-02-04 ENCOUNTER — Telehealth: Payer: Self-pay

## 2013-02-14 DIAGNOSIS — E559 Vitamin D deficiency, unspecified: Secondary | ICD-10-CM

## 2013-02-14 HISTORY — DX: Vitamin D deficiency, unspecified: E55.9

## 2013-03-02 ENCOUNTER — Emergency Department (HOSPITAL_COMMUNITY)
Admission: EM | Admit: 2013-03-02 | Discharge: 2013-03-02 | Disposition: A | Discharge status: Home / Self Care | Payer: Medicaid Other | Attending: Emergency Medicine | Admitting: Emergency Medicine

## 2013-03-02 ENCOUNTER — Encounter (HOSPITAL_COMMUNITY): Payer: Self-pay | Admitting: Emergency Medicine

## 2013-03-02 DIAGNOSIS — D5700 Hb-SS disease with crisis, unspecified: Secondary | ICD-10-CM

## 2013-03-02 DIAGNOSIS — D5701 Hb-SS disease with acute chest syndrome: Secondary | ICD-10-CM | POA: Insufficient documentation

## 2013-03-02 LAB — CBC WITH DIFFERENTIAL/PLATELET
Basophils Absolute: 0 10*3/uL (ref 0.0–0.1)
Basophils Relative: 0 % (ref 0–1)
Eosinophils Absolute: 0.1 10*3/uL (ref 0.0–0.7)
Eosinophils Relative: 1 % (ref 0–5)
HCT: 31.6 % — ABNORMAL LOW (ref 39.0–52.0)
Hemoglobin: 10.8 g/dL — ABNORMAL LOW (ref 13.0–17.0)
Lymphocytes Relative: 23 % (ref 12–46)
Lymphs Abs: 3 10*3/uL (ref 0.7–4.0)
MCH: 28.1 pg (ref 26.0–34.0)
MCHC: 34.2 g/dL (ref 30.0–36.0)
MCV: 82.1 fL (ref 78.0–100.0)
Monocytes Absolute: 0.8 10*3/uL (ref 0.1–1.0)
Monocytes Relative: 6 % (ref 3–12)
Neutro Abs: 9 10*3/uL — ABNORMAL HIGH (ref 1.7–7.7)
Neutrophils Relative %: 70 % (ref 43–77)
Platelets: 587 10*3/uL — ABNORMAL HIGH (ref 150–400)
RBC: 3.85 MIL/uL — ABNORMAL LOW (ref 4.22–5.81)
RDW: 21.3 % — ABNORMAL HIGH (ref 11.5–15.5)
WBC: 12.9 10*3/uL — ABNORMAL HIGH (ref 4.0–10.5)

## 2013-03-02 LAB — COMPREHENSIVE METABOLIC PANEL
ALT: 14 U/L (ref 0–53)
AST: 57 U/L — ABNORMAL HIGH (ref 0–37)
Albumin: 4 g/dL (ref 3.5–5.2)
Alkaline Phosphatase: 84 U/L (ref 39–117)
BUN: 7 mg/dL (ref 6–23)
CO2: 25 mEq/L (ref 19–32)
Calcium: 8.9 mg/dL (ref 8.4–10.5)
Chloride: 102 mEq/L (ref 96–112)
Creatinine, Ser: 0.64 mg/dL (ref 0.50–1.35)
GFR calc Af Amer: >90 mL/min (ref >90)
GFR calc non Af Amer: >90 mL/min (ref >90)
Glucose, Bld: 92 mg/dL (ref 70–99)
Potassium: 5.4 mEq/L — ABNORMAL HIGH (ref 3.7–5.3)
Sodium: 140 mEq/L (ref 137–147)
Total Bilirubin: 2.2 mg/dL — ABNORMAL HIGH (ref 0.3–1.2)
Total Protein: 8.2 g/dL (ref 6.0–8.3)

## 2013-03-02 LAB — RETICULOCYTES
RBC.: 3.85 MIL/uL — ABNORMAL LOW (ref 4.22–5.81)
Retic Count, Absolute: 161.7 10*3/uL (ref 19.0–186.0)
Retic Ct Pct: 4.2 % — ABNORMAL HIGH (ref 0.4–3.1)

## 2013-03-02 MED ORDER — SODIUM CHLORIDE 0.9 % IV BOLUS (SEPSIS)
1000.0000 mL | Freq: Once | INTRAVENOUS | Status: AC
  Administered 2013-03-02: 1000 mL via INTRAVENOUS

## 2013-03-02 MED ORDER — HYDROMORPHONE HCL PF 1 MG/ML IJ SOLN
2.0000 mg | Freq: Once | INTRAMUSCULAR | Status: AC
  Administered 2013-03-02: 2 mg via INTRAVENOUS
  Filled 2013-03-02: qty 2

## 2013-03-02 MED ORDER — HYDROMORPHONE HCL PF 1 MG/ML IJ SOLN
1.0000 mg | Freq: Once | INTRAMUSCULAR | Status: AC
  Administered 2013-03-02: 1 mg via INTRAVENOUS
  Filled 2013-03-02: qty 1

## 2013-03-02 MED ORDER — OXYCODONE-ACETAMINOPHEN 5-325 MG PO TABS: 1.0000 | ORAL_TABLET | ORAL | Status: DC | PRN

## 2013-03-02 MED ORDER — ONDANSETRON HCL 4 MG/2ML IJ SOLN
4.0000 mg | Freq: Once | INTRAMUSCULAR | Status: AC
  Administered 2013-03-02: 4 mg via INTRAVENOUS
  Filled 2013-03-02: qty 2

## 2013-03-02 MED ORDER — OXYCODONE-ACETAMINOPHEN 5-325 MG PO TABS
1.0000 | ORAL_TABLET | Freq: Once | ORAL | Status: AC
  Administered 2013-03-02: 1 via ORAL
  Filled 2013-03-02: qty 1

## 2013-03-02 NOTE — ED Notes (Signed)
Patient presents to ED with complaints of right flank pain that started yesterday.

## 2013-03-02 NOTE — ED Provider Notes (Signed)
CSN: [id number]     Arrival date & time 03/02/13  3825 History   First MD Initiated Contact with Patient 03/02/13 [phone]     Chief Complaint  Patient presents with  . Sickle Cell Pain Crisis   (Consider location/radiation/quality/duration/timing/severity/associated sxs/prior Treatment) The history is provided by the patient and medical records.   [name] with past history significant for sickle cell anemia, presenting to the ED for sickle cell pain crisis, onset last night. Patient endorses pain of his right lateral ribs/flank and lower back which are consistent with prior crisis. No recent injuries, trauma, or falls.  No urinary sx or hematuria.  Denies any chest pain, SOB, palpitations, cough, fever, or other URI type sx.  Patient is taking his home pain medications without improvement. He is was previously followed in [facility] for his sickle cell, but is currently being referred to Dr. Liston Alba at [facility] sickle cell clinic.  VS stable on arrival.   Past Medical History  Diagnosis Date  . Sickle cell anemia    No past surgical history on file. No family history on file. History  Substance Use Topics  . Smoking status: Never Smoker   . Smokeless tobacco: Never Used  . Alcohol Use: No    Review of Systems  Musculoskeletal: Positive for arthralgias and myalgias.  All other systems reviewed and are negative.    Allergies  Toradol and Tramadol  Home Medications   Current Outpatient Rx  Name  Route  Sig  Dispense  Refill  . folic acid (FOLVITE) 1 MG tablet   Oral   Take 1 mg by mouth daily.         . hydroxyurea (HYDREA) 500 MG capsule   Oral   Take 1 capsule (500 mg total) by mouth daily. May take with food to minimize GI side effects.   30 capsule   3   . oxyCODONE-acetaminophen (PERCOCET) 10-325 MG per tablet   Oral   Take 1 tablet by mouth every 4 (four) hours as needed for pain.   30 tablet   0    BP 108/57  Pulse 80   Temp(Src) 98.5 F (36.9 C) (Oral)  Resp 14  Ht 6\' 2"  (1.88 m)  Wt 140 lb (63.504 kg)  BMI 17.97 kg/m2  SpO2 100%  Physical Exam  Nursing note and vitals reviewed. Constitutional: He is oriented to person, place, and time. He appears well-developed and well-nourished. No distress.  Appears uncomfortable  HENT:  Head: Normocephalic and atraumatic.  Mouth/Throat: Oropharynx is clear and moist.  Eyes: Conjunctivae and EOM are normal. Pupils are equal, round, and reactive to light.  Neck: Normal range of motion. Neck supple.  Cardiovascular: Normal rate, regular rhythm and normal heart sounds.   Pulmonary/Chest: Effort normal and breath sounds normal. No respiratory distress. He has no wheezes.  Abdominal: Soft. Bowel sounds are normal.  Musculoskeletal: Normal range of motion. He exhibits no edema.  Pain of right lateral ribs and low back without noted deformities  Neurological: He is alert and oriented to person, place, and time.  Skin: Skin is warm and dry. He is not diaphoretic.  Psychiatric: He has a normal mood and affect.    ED Course  Procedures (including critical care time) Labs Review Labs Reviewed  CBC WITH DIFFERENTIAL - Abnormal; Notable for the following:    RBC 3.85 (*)    Hemoglobin 10.8 (*)    HCT 31.6 (*)    RDW 21.3 (*)  Platelets 587 (*)    All other components within normal limits  RETICULOCYTES - Abnormal; Notable for the following:    Retic Ct Pct 4.2 (*)    RBC. 3.85 (*)    All other components within normal limits  COMPREHENSIVE METABOLIC PANEL   Imaging Review No results found.  EKG Interpretation   None       MDM   1. Sickle cell pain crisis    Labs reassuring today. No chest pain, shortness of breath, palpitations, cough, or fever to suggest acute chest syndrome. Patient given multiple doses of pain medication the ED with good improvement of symptoms. Rx Percocet. Patient will followup with Dr. Zigmund Daniel. Return precautions  advised.  Larene Pickett, PA-C 03/02/13 [phone]

## 2013-03-02 NOTE — ED Notes (Signed)
Patient discharged to home with family. NAD.  

## 2013-03-02 NOTE — ED Notes (Signed)
Pt given sprite. Girlfriend at bedside. Vital signs stable. Pt up to bathroom with no issues.

## 2013-03-02 NOTE — ED Provider Notes (Signed)
Medical screening examination/treatment/procedure(s) were performed by non-physician practitioner and as supervising physician I was immediately available for consultation/collaboration.  EKG Interpretation   None       Elanora Quin, MD, FACEP   Chassie Pennix L Kuron Docken, MD 03/02/13 1545 

## 2013-03-02 NOTE — Discharge Instructions (Signed)
Take the prescribed medication as directed.  Do not drive while taking this medication. Follow-up with Dr. Zigmund Daniel as soon as possible. Return to the ED for new or worsening symptoms.

## 2013-03-03 ENCOUNTER — Encounter (HOSPITAL_COMMUNITY): Payer: Self-pay | Admitting: Emergency Medicine

## 2013-03-03 ENCOUNTER — Inpatient Hospital Stay (HOSPITAL_COMMUNITY)
Admission: EM | Admit: 2013-03-03 | Discharge: 2013-03-06 | DRG: 812 | Disposition: A | Discharge status: Home / Self Care | Payer: Medicaid Other | Attending: Family Medicine | Admitting: Family Medicine

## 2013-03-03 DIAGNOSIS — Z888 Allergy status to other drugs, medicaments and biological substances status: Secondary | ICD-10-CM

## 2013-03-03 DIAGNOSIS — Y9210 Unspecified residential institution as the place of occurrence of the external cause: Secondary | ICD-10-CM | POA: Diagnosis not present

## 2013-03-03 DIAGNOSIS — R4689 Other symptoms and signs involving appearance and behavior: Secondary | ICD-10-CM

## 2013-03-03 DIAGNOSIS — T40605A Adverse effect of unspecified narcotics, initial encounter: Secondary | ICD-10-CM | POA: Diagnosis not present

## 2013-03-03 DIAGNOSIS — Z79899 Other long term (current) drug therapy: Secondary | ICD-10-CM

## 2013-03-03 DIAGNOSIS — Z9089 Acquired absence of other organs: Secondary | ICD-10-CM

## 2013-03-03 DIAGNOSIS — F1210 Cannabis abuse, uncomplicated: Secondary | ICD-10-CM | POA: Diagnosis present

## 2013-03-03 DIAGNOSIS — D5700 Hb-SS disease with crisis, unspecified: Principal | ICD-10-CM | POA: Diagnosis present

## 2013-03-03 DIAGNOSIS — R404 Transient alteration of awareness: Secondary | ICD-10-CM | POA: Diagnosis not present

## 2013-03-03 LAB — COMPREHENSIVE METABOLIC PANEL
ALT: 11 U/L (ref 0–53)
AST: 30 U/L (ref 0–37)
Albumin: 3.9 g/dL (ref 3.5–5.2)
Alkaline Phosphatase: 94 U/L (ref 39–117)
BUN: 4 mg/dL — ABNORMAL LOW (ref 6–23)
CO2: 25 mEq/L (ref 19–32)
Calcium: 9 mg/dL (ref 8.4–10.5)
Chloride: 102 mEq/L (ref 96–112)
Creatinine, Ser: 0.63 mg/dL (ref 0.50–1.35)
GFR calc Af Amer: >90 mL/min (ref >90)
GFR calc non Af Amer: >90 mL/min (ref >90)
Glucose, Bld: 87 mg/dL (ref 70–99)
Potassium: 4.5 mEq/L (ref 3.7–5.3)
Sodium: 141 mEq/L (ref 137–147)
Total Bilirubin: 2.6 mg/dL — ABNORMAL HIGH (ref 0.3–1.2)
Total Protein: 7.8 g/dL (ref 6.0–8.3)

## 2013-03-03 LAB — CBC WITH DIFFERENTIAL/PLATELET
Basophils Absolute: 0 10*3/uL (ref 0.0–0.1)
Basophils Relative: 0 % (ref 0–1)
Eosinophils Absolute: 0.2 10*3/uL (ref 0.0–0.7)
Eosinophils Relative: 2 % (ref 0–5)
HCT: 29.1 % — ABNORMAL LOW (ref 39.0–52.0)
Hemoglobin: 10.1 g/dL — ABNORMAL LOW (ref 13.0–17.0)
Lymphocytes Relative: 22 % (ref 12–46)
Lymphs Abs: 2.4 10*3/uL (ref 0.7–4.0)
MCH: 28.1 pg (ref 26.0–34.0)
MCHC: 34.7 g/dL (ref 30.0–36.0)
MCV: 81.1 fL (ref 78.0–100.0)
Monocytes Absolute: 0.8 10*3/uL (ref 0.1–1.0)
Monocytes Relative: 7 % (ref 3–12)
Neutro Abs: 7.3 10*3/uL (ref 1.7–7.7)
Neutrophils Relative %: 69 % (ref 43–77)
Platelets: 504 10*3/uL — ABNORMAL HIGH (ref 150–400)
RBC: 3.59 MIL/uL — ABNORMAL LOW (ref 4.22–5.81)
RDW: 21.3 % — ABNORMAL HIGH (ref 11.5–15.5)
WBC: 10.7 10*3/uL — ABNORMAL HIGH (ref 4.0–10.5)

## 2013-03-03 LAB — RAPID URINE DRUG SCREEN, HOSP PERFORMED
Amphetamines: NOT DETECTED
Barbiturates: NOT DETECTED
Benzodiazepines: NOT DETECTED
Cocaine: NOT DETECTED
Opiates: NOT DETECTED
Tetrahydrocannabinol: POSITIVE — AB

## 2013-03-03 LAB — RETICULOCYTES
RBC.: 3.59 MIL/uL — ABNORMAL LOW (ref 4.22–5.81)
Retic Count, Absolute: 165.1 10*3/uL (ref 19.0–186.0)
Retic Ct Pct: 4.6 % — ABNORMAL HIGH (ref 0.4–3.1)

## 2013-03-03 LAB — URINALYSIS, ROUTINE W REFLEX MICROSCOPIC
Bilirubin Urine: NEGATIVE
Glucose, UA: NEGATIVE mg/dL
Hgb urine dipstick: NEGATIVE
Ketones, ur: NEGATIVE mg/dL
Leukocytes, UA: NEGATIVE
Nitrite: NEGATIVE
Protein, ur: NEGATIVE mg/dL
Specific Gravity, Urine: 1.007 (ref 1.005–1.030)
Urobilinogen, UA: 0.2 mg/dL (ref 0.0–1.0)
pH: 7 (ref 5.0–8.0)

## 2013-03-03 MED ORDER — SODIUM CHLORIDE 0.9 % IV BOLUS (SEPSIS)
1000.0000 mL | Freq: Once | INTRAVENOUS | Status: AC
  Administered 2013-03-03: 1000 mL via INTRAVENOUS

## 2013-03-03 MED ORDER — DIPHENHYDRAMINE HCL 25 MG PO CAPS: 25.0000 mg | ORAL_CAPSULE | Freq: Three times a day (TID) | ORAL | Status: DC | PRN

## 2013-03-03 MED ORDER — HYDROMORPHONE HCL PF 1 MG/ML IJ SOLN
2.0000 mg | Freq: Once | INTRAMUSCULAR | Status: AC
  Administered 2013-03-03: 2 mg via INTRAVENOUS
  Filled 2013-03-03: qty 2

## 2013-03-03 MED ORDER — INFLUENZA VAC SPLIT QUAD 0.5 ML IM SUSP
0.5000 mL | INTRAMUSCULAR | Status: AC
  Administered 2013-03-04: 0.5 mL via INTRAMUSCULAR
  Filled 2013-03-03: qty 0.5

## 2013-03-03 MED ORDER — HYDROXYUREA 500 MG PO CAPS
500.0000 mg | ORAL_CAPSULE | Freq: Every day | ORAL | Status: DC
  Administered 2013-03-03 – 2013-03-06 (×4): 500 mg via ORAL
  Filled 2013-03-03 (×4): qty 1

## 2013-03-03 MED ORDER — NALOXONE HCL 0.4 MG/ML IJ SOLN: 0.4000 mg | INTRAMUSCULAR | Status: DC | PRN

## 2013-03-03 MED ORDER — FOLIC ACID 1 MG PO TABS
1.0000 mg | ORAL_TABLET | Freq: Every day | ORAL | Status: DC
  Administered 2013-03-03 – 2013-03-06 (×4): 1 mg via ORAL
  Filled 2013-03-03 (×4): qty 1

## 2013-03-03 MED ORDER — ONDANSETRON HCL 4 MG/2ML IJ SOLN
4.0000 mg | Freq: Four times a day (QID) | INTRAMUSCULAR | Status: DC | PRN
Start: 2013-03-03 — End: 2013-03-04

## 2013-03-03 MED ORDER — PNEUMOCOCCAL VAC POLYVALENT 25 MCG/0.5ML IJ INJ
0.5000 mL | INJECTION | INTRAMUSCULAR | Status: AC
  Administered 2013-03-04: 0.5 mL via INTRAMUSCULAR
  Filled 2013-03-03: qty 0.5

## 2013-03-03 MED ORDER — SODIUM CHLORIDE 0.9 % IJ SOLN: 9.0000 mL | INTRAMUSCULAR | Status: DC | PRN

## 2013-03-03 MED ORDER — ONDANSETRON HCL 4 MG/2ML IJ SOLN
4.0000 mg | Freq: Once | INTRAMUSCULAR | Status: AC
  Administered 2013-03-03: 4 mg via INTRAVENOUS
  Filled 2013-03-03: qty 2

## 2013-03-03 MED ORDER — HYDROMORPHONE HCL PF 1 MG/ML IJ SOLN
1.0000 mg | Freq: Once | INTRAMUSCULAR | Status: AC
  Administered 2013-03-03: 1 mg via INTRAVENOUS
  Filled 2013-03-03: qty 1

## 2013-03-03 MED ORDER — HYDROMORPHONE 0.3 MG/ML IV SOLN
INTRAVENOUS | Status: DC
  Administered 2013-03-03: 1.39 mg via INTRAVENOUS
  Administered 2013-03-03: 0.3 mg via INTRAVENOUS
  Administered 2013-03-03: 1.79 mg via INTRAVENOUS
  Administered 2013-03-04: 0.3 mg via INTRAVENOUS
  Administered 2013-03-04: 2.79 mg via INTRAVENOUS
  Administered 2013-03-04: 1.79 mg via INTRAVENOUS
  Filled 2013-03-03 (×2): qty 25

## 2013-03-03 MED ORDER — SODIUM CHLORIDE 0.45 % IV SOLN
INTRAVENOUS | Status: DC
  Administered 2013-03-03: 17:00:00 via INTRAVENOUS
  Administered 2013-03-04: 1000 mL via INTRAVENOUS
  Administered 2013-03-04: 09:00:00 via INTRAVENOUS
  Administered 2013-03-05: 10 mL/h via INTRAVENOUS
  Administered 2013-03-05: 04:00:00 via INTRAVENOUS

## 2013-03-03 MED ORDER — SODIUM CHLORIDE 0.9 % IJ SOLN
3.0000 mL | Freq: Two times a day (BID) | INTRAMUSCULAR | Status: DC
  Administered 2013-03-05 – 2013-03-06 (×2): 3 mL via INTRAVENOUS

## 2013-03-03 MED ORDER — HEPARIN SODIUM (PORCINE) 5000 UNIT/ML IJ SOLN
5000.0000 [IU] | Freq: Three times a day (TID) | INTRAMUSCULAR | Status: DC
  Administered 2013-03-03 – 2013-03-05 (×2): 5000 [IU] via SUBCUTANEOUS
  Filled 2013-03-03 (×11): qty 1

## 2013-03-03 NOTE — ED Provider Notes (Signed)
Medical screening examination/treatment/procedure(s) were performed by non-physician practitioner and as supervising physician I was immediately available for consultation/collaboration.  EKG Interpretation    Date/Time:    Ventricular Rate:    PR Interval:    QRS Duration:   QT Interval:    QTC Calculation:   R Axis:     Text Interpretation:                Malvin Johns, MD 03/03/13 1950

## 2013-03-03 NOTE — ED Notes (Signed)
Pt presents to department for evaluation of sickle cell crisis. Was seen for same yesterday and discharged home. 10/10 pain all over body. Pt is alert and oriented x4. Ambulatory to triage.

## 2013-03-03 NOTE — ED Provider Notes (Signed)
CSN: [id number]     Arrival date & time 03/03/13  1057 History   First MD Initiated Contact with Patient 03/03/13 1126     Chief Complaint  Patient presents with  . Sickle Cell Pain Crisis   (Consider location/radiation/quality/duration/timing/severity/associated sxs/prior Treatment) HPI Comments: Patient with a history of Sickle Cell Anemia presents today with pain of his right lower back and both legs.  He reports that the pain has been constant for the past 2 days.  No acute injury or trauma.  He reports that the pain is similar to the pain that he normally has with a sickle cell crisis.  He was seen in the ED yesterday for the same.  He was then given IV pain medications and IVF and discharged home after his pain improved.  He was discharged home with prescription for Percocet.  He states that he has been taking the Percocet for pain without relief.  He denies SOB, fever, chills, cough, or chest pain.  Denies abdominal pain, nausea, vomiting, or diarrhea.  Denies hematuria or other urinary symptoms.  He has been followed by [facility] clinic for his Sickle Cell.  He has been referred to Dr. Zigmund Daniel in [place], but has not yet seen her.    Patient is a 23 y.o. male presenting with sickle cell pain. The history is provided by the patient.  Sickle Cell Pain Crisis   Past Medical History  Diagnosis Date  . Sickle cell anemia    History reviewed. No pertinent past surgical history. History reviewed. No pertinent family history. History  Substance Use Topics  . Smoking status: Never Smoker   . Smokeless tobacco: Never Used  . Alcohol Use: No    Review of Systems  All other systems reviewed and are negative.    Allergies  Toradol and Tramadol  Home Medications   Current Outpatient Rx  Name  Route  Sig  Dispense  Refill  . folic acid (FOLVITE) 1 MG tablet   Oral   Take 1 mg by mouth daily.         . hydroxyurea (HYDREA) 500 MG capsule   Oral   Take 1 capsule (500 mg  total) by mouth daily. May take with food to minimize GI side effects.   30 capsule   3   . oxyCODONE-acetaminophen (PERCOCET) 10-325 MG per tablet   Oral   Take 1 tablet by mouth every 4 (four) hours as needed for pain.   30 tablet   0   . oxyCODONE-acetaminophen (PERCOCET/ROXICET) 5-325 MG per tablet   Oral   Take 1 tablet by mouth every 4 (four) hours as needed.   15 tablet   0    BP 129/74  Pulse 84  Temp(Src) 98.5 F (36.9 C) (Oral)  Resp 18  SpO2 99% Physical Exam  Nursing note and vitals reviewed. Constitutional: He appears well-developed and well-nourished.  HENT:  Head: Normocephalic and atraumatic.  Mouth/Throat: Oropharynx is clear and moist.  Neck: Normal range of motion. Neck supple.  Cardiovascular: Normal rate, regular rhythm and normal heart sounds.   Pulses:      Dorsalis pedis pulses are 2+ on the right side, and 2+ on the left side.  Pulmonary/Chest: Effort normal and breath sounds normal. He exhibits no tenderness.  Abdominal: Soft. Bowel sounds are normal. He exhibits no distension and no mass. There is no tenderness. There is no rebound and no guarding.  Musculoskeletal: Normal range of motion. He exhibits no edema.  Cervical back: He exhibits normal range of motion, no tenderness, no bony tenderness, no swelling and no edema.       Thoracic back: He exhibits normal range of motion, no tenderness, no bony tenderness, no swelling, no edema and no deformity.       Lumbar back: He exhibits normal range of motion, no tenderness, no bony tenderness, no swelling, no edema and no deformity.  No erythema, edema, or warmth of lower extremities  Neurological: He is alert.  Skin: Skin is warm and dry.  Psychiatric: He has a normal mood and affect.    ED Course  Procedures (including critical care time) Labs Review Labs Reviewed  CBC WITH DIFFERENTIAL  COMPREHENSIVE METABOLIC PANEL  RETICULOCYTES   Imaging Review No results found.  EKG  Interpretation   None      12:59 PM Reassessed patient.  He reports that his pain has not improved at this time.  1:50 PM Reassessed patient.  He reports mild improvement of pain.  He rates his pain as 9/10 at this time. 2:43 PM Reassessed patient.  He currently reports that his pain is 9.5/10.  Patient currently texting on his cell phone. 3:17 PM Discussed with Family Practice service who has agreed to admit the patient.  MDM  No diagnosis found. Patient with a history of Sickle Cell presents today with right lower back pain and bilateral lower extremity pain.  He reports that the pain is similar to pain that he has had in the past with previous Sickle Cell pain crisis.  Labs today unremarkable.  Hemoglobin stable.  Retic count and WBC mildly elevated.  Patient given several rounds of IV pain medication and IVF in the ED without improvement in pain.  Therefore, patient admitted for further pain management.  Patient denies fever, chest pain, SOB, or cough.  VSS.  Therefore, do not feel that CXR is indicated at this time.  Doubt Acute Chest Syndrome.    Hyman Bible, PA-C 03/03/13 1935

## 2013-03-03 NOTE — H&P (Signed)
[facility] Admission History and Physical Service Pager: [phone]  Patient name: Timothy Marks Medical record number: [id number] Date of birth: Jun 15, 1990 Age: 23 y.o. Gender: male  Primary Care Provider: No PCP Per Patient Consultants: None Code Status: Full  Chief Complaint: Pain: lower back & legs typical of pt's usual sickle crisis pain  Assessment and Plan: Timothy Marks is a 23 y.o. male presenting with Rt-sided abdominal/flank pain. PMH is significant for Sickle cell disease, and cholecystectomy.   # Rt-side ab/flank pain - Most likely Sickle Cell Pain as pt reports consistent with previous episodes; DDx includes: MSK pain vs less likely pyelonephritis or hepatitis (afebrile, mild leukocytosis WBC 10.7, VSS, CMET wnl) - Denies ACS symptoms: No CP or SOB and sats 99-100% on RA without tachypnea in the ED - Consider: CXR, EKG, Trop if develops fevers or respiratory symptoms - Will check UA; Consider Imaging pending UA, trending WBC and Pain improvement - Dilaudid PCA (low-dose); will transition to PO medication with improved pain - IVF @ 125  # Sickle Cell Anemia - Was established at [facility] sickle cell/hematology clinic (last 07/2012), but trying to switch care to [facility] - Continue Hydroxyurea and Folic acid - Hgb 13.0 w/ retic 4.6% - trend Hb, consider transfusion Hb <7 if worsening symptoms  # Hx of Drug abuse - Required escort out of [facility] hospital 01/2013 when he became upset for not receiving "enough" Percocet - UDS positive for cannabinoids on 04/16/2012, and told he was no longer eligible for Schedule II prescriptions through [facility] Hematology clinic - check UDS, consider CSW consult for abuse counseling  FEN/GI: 1/2 NS @ 125; Reg diet Prophylaxis: Heparin  Disposition: Admit to tele while on PCA; Attending Dr Ree Kida; Discharge pending pain control / tolerance of PO meds  History of Present Illness: Timothy Marks is a 23 y.o. male  presenting with pain of his right flank and both legs that has been constant for the past 3 days. He reports that the pain is similar to the pain that he normally has with a sickle cell crisis. He was seen in the ED yesterday for the same. He was then given IV pain medications and IVF and discharged home after his pain improved. He was discharged home with prescription for Percocet. He states that he has been taking the Percocet for pain without relief. Denies marijuana or other illicit drug use; note pt has had marijuana on UDS in the past (last in March 2014). He denies SOB, fever, chills, cough, or chest pain. Denies abdominal pain, nausea, vomiting, dysuria, hematuria, or diarrhea. Denies recent trauma.   ED Course: 6 mg of IV Dilaudid over 3/12 hrs; IVF  Review Of Systems: Per HPI with the following additions: none Otherwise 12 point review of systems was performed and was unremarkable.  Patient Active Problem List   Diagnosis Date Noted  . Aggressive behavior 01/25/2013  . Sickle cell anemia with pain 01/24/2013  . Sickle cell pain crisis 01/24/2013   Past Medical History: Past Medical History  Diagnosis Date  . Sickle cell anemia    Past Surgical History: History reviewed. No pertinent past surgical history. Social History: History  Substance Use Topics  . Smoking status: Never Smoker   . Smokeless tobacco: Never Used  . Alcohol Use: No   Additional social history:   Please also refer to relevant sections of EMR.  Family History: History reviewed. No pertinent family history. Allergies and Medications: Allergies  Allergen Reactions  . Toradol [  Ketorolac Tromethamine] Itching  . Tramadol Hives   No current facility-administered medications on file prior to encounter.   Current Outpatient Prescriptions on File Prior to Encounter  Medication Sig Dispense Refill  . folic acid (FOLVITE) 1 MG tablet Take 1 mg by mouth daily.      . hydroxyurea (HYDREA) 500 MG capsule Take  1 capsule (500 mg total) by mouth daily. May take with food to minimize GI side effects.  30 capsule  3  . oxyCODONE-acetaminophen (PERCOCET) 10-325 MG per tablet Take 1 tablet by mouth every 4 (four) hours as needed for pain.  30 tablet  0  . oxyCODONE-acetaminophen (PERCOCET/ROXICET) 5-325 MG per tablet Take 1 tablet by mouth every 4 (four) hours as needed.  15 tablet  0    Objective: BP 129/78  Pulse 80  Temp(Src) 98.5 F (36.9 C) (Oral)  Resp 18  SpO2 99% Exam: General: Thin male; NAD Cardiovascular: RRR, No M/R/G Respiratory: CTAB; Normal resp effort Abdomen: Tenderness to touch in Rt upper and lower quad as well as Rt flank; NO rash  Extremities: WWP, distal pulses intact to all four extremities Neuro: alert / oriented x3, no gross focal deficit   PERRLA, EOMI, moves all extremities equally / spontaneously  strength normal, sensation intact   Labs and Imaging: CBC BMET   Recent Labs Lab 03/03/13 1220  WBC 10.7*  HGB 10.1*  HCT 29.1*  PLT 504*    Recent Labs Lab 03/03/13 1220  NA 141  K 4.5  CL 102  CO2 25  BUN 4*  CREATININE 0.63  GLUCOSE 87  CALCIUM 9.0     Phill Myron, MD 03/03/2013, 3:20 PM PGY-1, [place] [facility] pager: [phone], text pages welcome  FPTS Upper-Level Resident Addendum  I have independently interviewed and examined the patient. I have discussed the above with Dr. Berkley Harvey and agree with his documentation as above. The above reflects his original note with my edits for correction/additions/clarification in orange. Please see also any attending notes.   Emmaline Kluver, MD PGY-2, [facility] pager: [phone] (text pages welcome through [facility])

## 2013-03-04 DIAGNOSIS — F603 Borderline personality disorder: Secondary | ICD-10-CM

## 2013-03-04 LAB — BASIC METABOLIC PANEL
BUN: 4 mg/dL — ABNORMAL LOW (ref 6–23)
CO2: 27 mEq/L (ref 19–32)
Calcium: 8.9 mg/dL (ref 8.4–10.5)
Chloride: 102 mEq/L (ref 96–112)
Creatinine, Ser: 0.66 mg/dL (ref 0.50–1.35)
GFR calc Af Amer: >90 mL/min (ref >90)
GFR calc non Af Amer: >90 mL/min (ref >90)
Glucose, Bld: 85 mg/dL (ref 70–99)
Potassium: 4.1 mEq/L (ref 3.7–5.3)
Sodium: 139 mEq/L (ref 137–147)

## 2013-03-04 LAB — CBC
HCT: 27.7 % — ABNORMAL LOW (ref 39.0–52.0)
Hemoglobin: 9.5 g/dL — ABNORMAL LOW (ref 13.0–17.0)
MCH: 28 pg (ref 26.0–34.0)
MCHC: 34.3 g/dL (ref 30.0–36.0)
MCV: 81.7 fL (ref 78.0–100.0)
Platelets: 460 10*3/uL — ABNORMAL HIGH (ref 150–400)
RBC: 3.39 MIL/uL — ABNORMAL LOW (ref 4.22–5.81)
RDW: 21.4 % — ABNORMAL HIGH (ref 11.5–15.5)
WBC: 11.6 10*3/uL — ABNORMAL HIGH (ref 4.0–10.5)

## 2013-03-04 MED ORDER — OXYCODONE-ACETAMINOPHEN 5-325 MG PO TABS
1.0000 | ORAL_TABLET | Freq: Four times a day (QID) | ORAL | Status: DC
Start: 2013-03-04 — End: 2013-03-05
  Administered 2013-03-04 – 2013-03-05 (×2): 1 via ORAL
  Filled 2013-03-04 (×2): qty 1

## 2013-03-04 MED ORDER — OXYCODONE-ACETAMINOPHEN 5-325 MG PO TABS
1.0000 | ORAL_TABLET | ORAL | Status: DC | PRN
  Administered 2013-03-04: 1 via ORAL
  Filled 2013-03-04: qty 1

## 2013-03-04 MED ORDER — OXYCODONE HCL 5 MG PO TABS
5.0000 mg | ORAL_TABLET | Freq: Four times a day (QID) | ORAL | Status: DC
  Administered 2013-03-04 – 2013-03-05 (×2): 5 mg via ORAL
  Filled 2013-03-04 (×2): qty 1

## 2013-03-04 MED ORDER — OXYCODONE HCL 5 MG PO TABS
5.0000 mg | ORAL_TABLET | ORAL | Status: DC | PRN
  Administered 2013-03-04: 5 mg via ORAL
  Filled 2013-03-04: qty 1

## 2013-03-04 MED ORDER — HYDROMORPHONE HCL PF 1 MG/ML IJ SOLN
2.0000 mg | INTRAMUSCULAR | Status: DC | PRN
  Administered 2013-03-04 – 2013-03-05 (×6): 2 mg via INTRAVENOUS
  Filled 2013-03-04 (×6): qty 2

## 2013-03-04 NOTE — Progress Notes (Signed)
Family Medicine Teaching Service Daily Progress Note Intern Pager: [phone]  Patient name: Timothy Marks Medical record number: [id number] Date of birth: 11-11-1990 Age: 23 y.o. Gender: male  Primary Care Provider: No PCP Per Patient Consultants: None Code Status: Full  Pt Overview and Major Events to Date:  1/18: Sickle cell crisis- Dilaudid PCA started & IVF  Assessment and Plan: Timothy Marks is a 23 y.o. male presenting with Rt-sided abdominal/flank pain. PMH is significant for Sickle cell disease, and cholecystectomy.   # Rt-side ab/flank pain  - Most likely Sickle Cell Pain as pt reports consistent with previous episodes; DDx includes: MSK pain vs less likely pyelonephritis or hepatitis (afebrile, mild leukocytosis WBC 10.7, VSS, CMET wnl)  - Denies ACS symptoms: No CP or SOB and sats 99-100% on RA without tachypnea in the ED   - Consider: CXR, EKG, Trop if develops fevers or respiratory symptoms  - UA: Clean - WBC 11.6 (1/19) > 10.7 (1/18) but remains afebrile - Dilaudid PCA (low-dose); will transition to PO medication with improved pain  - IVF dec to 50 cc/hr as tolerating PO  # Sickle Cell Anemia  - Was established at [facility] sickle cell/hematology clinic (last 07/2012), but trying to switch care to [facility]  - Continue Hydroxyurea and Folic acid  - Hgb 9.5, most likely dilutional due to IVF [ Hgb 10.1 w/ retic 4.6% on admit ] - trend Hb, consider transfusion Hb <7 if worsening symptoms   # Hx of Drug abuse  - Required escort out of [facility] hospital 01/2013 when he became upset for not receiving "enough" Percocet  - UDS positive for cannabinoids on 04/16/2012, and told he was no longer eligible for Schedule II prescriptions through [facility] Hematology clinic  - UDS + for Marijuana; consider CSW consult for abuse counseling   FEN/GI: 1/2 NS @ 50; Reg diet  Prophylaxis: Heparin   Disposition: Admit to tele while on PCA; Attending Dr Ree Kida; Discharge pending pain control /  tolerance of PO meds  Subjective: Report pain is unchanged in his right side and legs. Denies CP, SOB, or N/V.   Objective: Temp:  [98 F (36.7 C)-98.8 F (37.1 C)] 98.3 F (36.8 C) (01/19 0625) Pulse Rate:  [68-92] 68 (01/19 0625) Resp:  [11-20] 11 (01/19 0744) BP: (115-137)/(69-81) 126/75 mmHg (01/19 0625) SpO2:  [95 %-100 %] 95 % (01/19 0744) Weight:  [128 lb 8 oz (58.287 kg)-129 lb 9.6 oz (58.786 kg)] 128 lb 8 oz (58.287 kg) (01/18 2228) Physical Exam: General: Thin male; NAD  Cardiovascular: RRR, No M/R/G  Respiratory: CTAB; Normal resp effort  Abdomen: Tenderness to touch w/ voluntary guarding in Rt upper and lower quad as well as Rt flank; NO rash or rigidity Extremities: WWP, distal pulses intact to all four extremities  Laboratory:  Recent Labs Lab 03/02/13 1010 03/03/13 1220 03/04/13 0510  WBC 12.9* 10.7* 11.6*  HGB 10.8* 10.1* 9.5*  HCT 31.6* 29.1* 27.7*  PLT 587* 504* 460*    Recent Labs Lab 03/02/13 1010 03/03/13 1220 03/04/13 0510  NA 140 141 139  K 5.4* 4.5 4.1  CL 102 102 102  CO2 25 25 27   BUN 7 4* 4*  CREATININE 0.64 0.63 0.66  CALCIUM 8.9 9.0 8.9  PROT 8.2 7.8  --   BILITOT 2.2* 2.6*  --   ALKPHOS 84 94  --   ALT 14 11  --   AST 57* 30  --   GLUCOSE 92 87 85   UDS: +  Marijuana UA: No Nit, Leuks, Ketone, Gluc or Protein  Imaging/Diagnostic Tests: None  Phill Myron, MD 03/04/2013, 8:18 AM PGY-1, [place] Intern pager: [phone], text pages welcome

## 2013-03-04 NOTE — Care Management Note (Signed)
   CARE MANAGEMENT NOTE 03/04/2013  Patient:  Timothy Marks, Timothy Marks   Account Number:  [id number]  Date Initiated:  03/04/2013  Documentation initiated by:  Jasmine Pang  Subjective/Objective Assessment:   Referral for PCP.     Action/Plan:   Please noted that pt has Medicaid [place] Access, therefore has an assigned PCP listed on this Medicaid card. Also noted that pt has been in contact with the [facility] for care.   Anticipated DC Date:     Anticipated DC Plan:  HOME/SELF CARE         Choice offered to / List presented to:             Status of service:  Completed, signed off Medicare Important Message given?   (If response is "NO", the following Medicare IM given date fields will be blank) Date Medicare IM given:   Date Additional Medicare IM given:    Discharge Disposition:  HOME/SELF CARE  Per UR Regulation:    If discussed at Long Length of Stay Meetings, dates discussed:    Comments:

## 2013-03-04 NOTE — Progress Notes (Signed)
Utilization review completed.  

## 2013-03-04 NOTE — Progress Notes (Signed)
Interim Progress Note S: Spoke with patient and girlfriend regarding pain medications.  He is upset at being cut off of IV medications.  He states that oral medications don't work at all, even when he is at home.  He is very difficult to get history from.  O:  BP 133/70  Pulse 76  Temp(Src) 99.3 F (37.4 C) (Oral)  Resp 17  Ht 6\' 2"  (1.88 m)  Wt 128 lb 8 oz (58.287 kg)  BMI 16.49 kg/m2  SpO2 98% Gen: alert, somewhat cooperative, NAD, was walking in hallway without difficulty  A/P: 23 yo sickle cell pain crisis - discussed extensively with pt and girlfriend typical protocol - offered increased oral medication vs addition of IV breakthrough - pt opted for IV breakthrough - will schedule percocet 10-325 q6hrs - dilaudid 2mg  q2hr prn available overnight - discussed that this will be readdressed in the morning and the IV will likely be weaned - he and girlfriend agreed with plan  Chaselyn Nanney 03/04/2013, 6:49 PM

## 2013-03-04 NOTE — Progress Notes (Signed)
Interim Progress Note:   S: Went to discuss pain control with Timothy Marks, and let him know that we are switching him to PO pain medications as he is able to tolerate eating w/o N/V. He refused to speak or to allow me to exam him. Spoke with his nurse who said he refused Percocet and said he would try to tolerate the pain without medication.    O:  BP 126/73  Pulse 57  Temp(Src) 98.5 F (36.9 C) (Oral)  Resp 16  Ht 6\' 2"  (1.88 m)  Wt 128 lb 8 oz (58.287 kg)  BMI 16.49 kg/m2  SpO2 97% Patient refused physical exam; VSS - No objective findings indicating pain; he appears to be comfortable lying in bed.  A/P   Have made Percocet available to patient as he has tolerated this well in the past.   Timothy Marks [facility] Medicine PGY-1 Resident

## 2013-03-04 NOTE — H&P (Addendum)
FMTS Attending Note  I personally saw and evaluated the patient. The plan of care was discussed with the resident team. I agree with the assessment and plan as documented by the resident.   23 year old male admitted with sickle cell pain crisis, pain is primarily in the patient's lites and lower back, please refer to resident note for further history of present illness. Patient states the pain continues to be a controlled on Dilaudid PCA and Toradol as needed  Vitals: Reviewed General: Pleasant African American male, drowsy however arousable Cardiac: Regular in rhythm, S1 and S2 present, no murmurs, no heaves or thrills Respiratory: Clear to auscultation bilaterally, normal effort Abdomen: Soft, nontender, bowel sounds present Extremities: No edema  Reviewed lab work and imaging  #1. Sickle cell pain crisis-patient is stable hemoglobin with appropriately elevated reticulocyte count, no evidence of acute infection, agree with checking urinalysis given abdominal pain at time of admission, continue Dilaudid PCA and IV fluid rehydration #2. Low BMI without evidence of malnutrition.   Dossie Arbour MD

## 2013-03-04 NOTE — Progress Notes (Signed)
Dilaudid PCA discontinued. 12.5 mL Dilaudid (0.3 mg/mL concentration) wasted in sink, witnessed by Marijean Heath, RN.

## 2013-03-04 NOTE — Progress Notes (Signed)
Seen today by attending Dr. Ree Kida.  Discussed in rounds.  Agree with Dr. Milagros Reap documentation and management.

## 2013-03-05 LAB — HEPATIC FUNCTION PANEL
ALT: 11 U/L (ref 0–53)
AST: 36 U/L (ref 0–37)
Albumin: 4.3 g/dL (ref 3.5–5.2)
Alkaline Phosphatase: 97 U/L (ref 39–117)
Bilirubin, Direct: 0.4 mg/dL — ABNORMAL HIGH (ref 0.0–0.3)
Indirect Bilirubin: 2.9 mg/dL — ABNORMAL HIGH (ref 0.3–0.9)
Total Bilirubin: 3.3 mg/dL — ABNORMAL HIGH (ref 0.3–1.2)
Total Protein: 8.3 g/dL (ref 6.0–8.3)

## 2013-03-05 LAB — CBC
HCT: 29.1 % — ABNORMAL LOW (ref 39.0–52.0)
Hemoglobin: 10 g/dL — ABNORMAL LOW (ref 13.0–17.0)
MCH: 28 pg (ref 26.0–34.0)
MCHC: 34.4 g/dL (ref 30.0–36.0)
MCV: 81.5 fL (ref 78.0–100.0)
Platelets: 455 10*3/uL — ABNORMAL HIGH (ref 150–400)
RBC: 3.57 MIL/uL — ABNORMAL LOW (ref 4.22–5.81)
RDW: 21.6 % — ABNORMAL HIGH (ref 11.5–15.5)
WBC: 14.5 10*3/uL — ABNORMAL HIGH (ref 4.0–10.5)

## 2013-03-05 LAB — BASIC METABOLIC PANEL
BUN: 7 mg/dL (ref 6–23)
CO2: 27 mEq/L (ref 19–32)
Calcium: 9.4 mg/dL (ref 8.4–10.5)
Chloride: 99 mEq/L (ref 96–112)
Creatinine, Ser: 0.7 mg/dL (ref 0.50–1.35)
GFR calc Af Amer: >90 mL/min (ref >90)
GFR calc non Af Amer: >90 mL/min (ref >90)
Glucose, Bld: 87 mg/dL (ref 70–99)
Potassium: 4.2 mEq/L (ref 3.7–5.3)
Sodium: 138 mEq/L (ref 137–147)

## 2013-03-05 MED ORDER — ACETAMINOPHEN 500 MG PO TABS
1000.0000 mg | ORAL_TABLET | Freq: Three times a day (TID) | ORAL | Status: DC
  Administered 2013-03-05 – 2013-03-06 (×2): 1000 mg via ORAL
  Filled 2013-03-05 (×6): qty 2

## 2013-03-05 MED ORDER — TRAZODONE HCL 50 MG PO TABS
50.0000 mg | ORAL_TABLET | Freq: Every day | ORAL | Status: DC
  Administered 2013-03-05: 50 mg via ORAL
  Filled 2013-03-05 (×2): qty 1

## 2013-03-05 MED ORDER — MORPHINE SULFATE 15 MG PO TABS
15.0000 mg | ORAL_TABLET | ORAL | Status: DC | PRN
  Administered 2013-03-05: 15 mg via ORAL
  Filled 2013-03-05: qty 1

## 2013-03-05 MED ORDER — MORPHINE SULFATE 15 MG PO TABS
15.0000 mg | ORAL_TABLET | ORAL | Status: DC | PRN
  Administered 2013-03-05 – 2013-03-06 (×5): 15 mg via ORAL
  Filled 2013-03-05 (×6): qty 1

## 2013-03-05 NOTE — Progress Notes (Signed)
Pt repeatedly calling out for pain medication, however is refusing to try PO tylenol or PO morphine. Pt verbally aggressive toward staff and demanding to see his doctor. MD is aware and states he will be by to see patient when available. Will continue to monitor.

## 2013-03-05 NOTE — Progress Notes (Signed)
Family Medicine Teaching Service Daily Progress Note Intern Pager: [phone]  Patient name: Timothy Marks Medical record number: [id number] Date of birth: 09/15/1990 Age: 23 y.o. Gender: male  Primary Care Provider: No PCP Per Patient Consultants: None Code Status: Full  Pt Overview and Major Events to Date:  1/18: Sickle cell crisis- Dilaudid PCA started & IVF  Assessment and Plan: Daven Montz is a 23 y.o. male presenting with Rt-sided abdominal/flank pain. PMH is significant for Sickle cell disease, and cholecystectomy.   # Rt-side ab/flank pain  - Most likely Sickle Cell Pain as pt reports consistent with previous episodes; DDx includes: MSK pain vs less likely pyelonephritis or hepatitis (afebrile, mild leukocytosis WBC 10.7, VSS, CMET wnl)  - Denies ACS symptoms: No CP or SOB and sats 99-100% on RA without tachypnea in the ED   - Consider: CXR, EKG, Trop if develops fevers or respiratory symptoms  - UA: Clean - WBC 11.6 (1/19) > 10.7 (1/18) but remains afebrile - Holding all pain medication right now due to somnolence, will readdress when he arouses  # Sickle Cell Anemia  - Was established at [facility] sickle cell/hematology clinic (last 07/2012), but trying to switch care to [facility]  - Continue Hydroxyurea and Folic acid  - Hgb 9.5, most likely dilutional due to IVF [ Hgb 10.1 w/ retic 4.6% on admit ] - trend Hb, consider transfusion Hb <7 if worsening symptoms   # Hx of Drug abuse  - Required escort out of [facility] hospital 01/2013 when he became upset for not receiving "enough" Percocet  - UDS positive for cannabinoids on 04/16/2012, and told he was no longer eligible for Schedule II prescriptions through [facility] Hematology clinic  - UDS + for Marijuana; consider CSW consult for abuse counseling   FEN/GI: KVO; Reg diet  Prophylaxis: Heparin   Disposition: Admit to tele while on PCA; Attending Dr Ree Kida; Discharge pending pain control / tolerance of PO meds  Subjective:  Patient asleep when i arrived in room; opened eyes and said "let me get my list" when asked how he was doing today, then fell asleep before answering question. About to open eyes when asked and follow commands and answer some questions, but extremely somnolent.    Objective: Temp:  [98 F (36.7 C)-99.3 F (37.4 C)] 98.1 F (36.7 C) (01/20 0806) Pulse Rate:  [57-87] 80 (01/20 0806) Resp:  [16-17] 16 (01/20 0806) BP: (110-133)/(55-73) 125/72 mmHg (01/20 0806) SpO2:  [94 %-100 %] 94 % (01/20 0806) Weight:  [123 lb 1.6 oz (55.838 kg)] 123 lb 1.6 oz (55.838 kg) (01/19 2234)  Physical Exam: General: Thin male; NAD; Extremely somnolent  Cardiovascular: RRR, No M/R/G  Respiratory: CTAB; Normal resp effort  Abdomen: No tenderness this morning (palpatided patient's abdomen while asleep without tendness)  Extremities: WWP, distal pulses intact to all four extremities  Laboratory:  Recent Labs Lab 03/02/13 1010 03/03/13 1220 03/04/13 0510  WBC 12.9* 10.7* 11.6*  HGB 10.8* 10.1* 9.5*  HCT 31.6* 29.1* 27.7*  PLT 587* 504* 460*    Recent Labs Lab 03/02/13 1010 03/03/13 1220 03/04/13 0510  NA 140 141 139  K 5.4* 4.5 4.1  CL 102 102 102  CO2 25 25 27   BUN 7 4* 4*  CREATININE 0.64 0.63 0.66  CALCIUM 8.9 9.0 8.9  PROT 8.2 7.8  --   BILITOT 2.2* 2.6*  --   ALKPHOS 84 94  --   ALT 14 11  --   AST 57* 30  --  GLUCOSE 92 87 85   UDS: + Marijuana UA: No Nit, Leuks, Ketone, Gluc or Protein  Imaging/Diagnostic Tests: None  Phill Myron, MD 03/05/2013, 8:59 AM PGY-1, [place] Intern pager: [phone], text pages welcome

## 2013-03-05 NOTE — Care Management Note (Addendum)
Noted consult for appointment with Dr Zigmund Daniel at [facility]. Per pt he is aware of clinic and has previously made contact with the clinic including starting "paperwork".  Pt is aware of his transportation issues and schedule needs, girlfriend at bedside. Will provide this pt and girlfriend with phone number to schedule an appointment that may meet their needs.  Clinic phone number is [phone].  CM will validate on 03/06/13, with pt that he or girlfriend have scheduled an appointment.  Jasmine Pang RN MPH, case manager, [phone]

## 2013-03-05 NOTE — Progress Notes (Signed)
Pt refusing tylenol, states it wont touch his pain. States he "will just call his girl and have her bring him something." MD notified, will continue to monitor. Tylenol wasted in sharps container.

## 2013-03-05 NOTE — Progress Notes (Signed)
Seen and examined.  Agree with Dr. Berkley Harvey.  Timothy Marks is becoming quite difficult to deal with.  He at times refuses to talk to team members.  Refuses oral meds and demands dilaudid (we refused because he became over sedated on dilaudid.)  I know question our ability to trust his subjective complaints.  We have tried gentle relationship building with him and it has been unsuccessful.  Now we will try Plan B which will be to set firm, reasonable limits.  We shall see.

## 2013-03-05 NOTE — Progress Notes (Signed)
Patient requested pain med , went to give msir 15 mg  But pt refused . He  Only wants  IV pain  Med  Or  Nothing at  All.  Informed patient  To call me if he  Changes  His  Mind.

## 2013-03-05 NOTE — Progress Notes (Signed)
Pt continues to request pain medication. Pt is currently awake and conversating on the phone appropriately, however seems intermittently inappropriate/altered. MD paged to address pain management plan. Will continue to monitor.

## 2013-03-05 NOTE — Progress Notes (Signed)
Interim Progress Note:   S: Went to speak to patient regarding pain medications @ 12:30 pm, and [name] demanded for IV Dilaudid to be restarted. I explained that we stopped the IV dilaudid b/c he was extremely somnolent this morning and falling asleep during his exam, yet him reported that his pain was no better than prior to receiving the dilaudid. We discussed several PO pain medication options and he decide to try Morphine IR. I was paged again ~ 2pm, with Nursing report that patient was refusing to take tylenol or morphine. Again, went to discuss pain medications and [name] refused to take anything but IV dilaudid. I reiterated our previous discussion about Dilaudid causing somnolence without improving his pain. While trying to discuss other options, [name] called his father on the phone and requested that he come to the hospital because "they won't give me any pain meds." [name] then told me to "get out of here; we'll see what happens when my daddy gets here and shows his ass."    O:  BP 121/73  Pulse 70  Temp(Src) 98.2 F (36.8 C) (Oral)  Resp 18  Ht 6\' 2"  (1.88 m)  Wt 123 lb 1.6 oz (55.838 kg)  BMI 15.80 kg/m2  SpO2 92% - Physical exam not permitted by patient.   A/P  - Restarted IVF @ 125 as nursing reported decreased eating and drinking - Ordered Victor O2 @ 2L if patient desire oxygen therapy for pain relief - Left order for Morphine IR prn and scheduled tylenol or pain control   Corky Downs Family Medicine PGY-1 Resident

## 2013-03-05 NOTE — Clinical Documentation Improvement (Signed)
THIS DOCUMENT IS NOT A PERMANENT PART OF THE MEDICAL RECORD  Please update your documentation with the medical record to reflect your response to this query. If you need help knowing how to do this please call [phone].  03/05/13   Dear Dr. [facility],  In a better effort to capture your patient's severity of illness, reflect appropriate length of stay and utilization of resources, a review of the patient medical record has revealed the following indicators.    Based on your clinical judgment, please clarify and document in a progress note and/or discharge summary the clinical condition associated with the following supporting information:  In responding to this query please exercise your independent judgment.  The fact that a query is asked, does not imply that any particular answer is desired or expected.  Possible Clinical Conditions?  Mild Malnutrition  Moderate Malnutrition Severe Malnutrition   Protein Calorie Malnutrition Severe Protein Calorie Malnutrition Other Condition Cannot clinically determine   Supporting Information: Signs & Symptoms: Ht 6\' 2"  (1.88 m)  Wt 128 lb 8 oz (58.287 kg)  BMI 16.49 kg/m2  SpO2 98%   You may use possible, probable, or suspect with inpatient documentation. possible, probable, suspected diagnoses MUST be documented at the time of discharge  Reviewed: additional documentation in the medical record  Thank You,  Alessandra Grout, RN, BSN, CCDS  Clinical Documentation Specialist: [phone] [place]    The patient is not malnourished based on my examination.

## 2013-03-06 LAB — BASIC METABOLIC PANEL
BUN: 9 mg/dL (ref 6–23)
CO2: 25 mEq/L (ref 19–32)
Calcium: 9.5 mg/dL (ref 8.4–10.5)
Chloride: 102 mEq/L (ref 96–112)
Creatinine, Ser: 0.59 mg/dL (ref 0.50–1.35)
GFR calc Af Amer: >90 mL/min (ref >90)
GFR calc non Af Amer: >90 mL/min (ref >90)
Glucose, Bld: 97 mg/dL (ref 70–99)
Potassium: 4.3 mEq/L (ref 3.7–5.3)
Sodium: 140 mEq/L (ref 137–147)

## 2013-03-06 LAB — CBC
HCT: 28.3 % — ABNORMAL LOW (ref 39.0–52.0)
Hemoglobin: 9.7 g/dL — ABNORMAL LOW (ref 13.0–17.0)
MCH: 28.2 pg (ref 26.0–34.0)
MCHC: 34.3 g/dL (ref 30.0–36.0)
MCV: 82.3 fL (ref 78.0–100.0)
Platelets: 397 10*3/uL (ref 150–400)
RBC: 3.44 MIL/uL — ABNORMAL LOW (ref 4.22–5.81)
RDW: 21.3 % — ABNORMAL HIGH (ref 11.5–15.5)
WBC: 11.6 10*3/uL — ABNORMAL HIGH (ref 4.0–10.5)

## 2013-03-06 MED ORDER — MORPHINE SULFATE 15 MG PO TABS: 15.0000 mg | ORAL_TABLET | ORAL | Status: DC | PRN

## 2013-03-06 MED ORDER — IBUPROFEN 800 MG PO TABS: 800.0000 mg | ORAL_TABLET | Freq: Three times a day (TID) | ORAL | Status: DC | PRN

## 2013-03-06 MED ORDER — ACETAMINOPHEN 500 MG PO TABS: 1000.0000 mg | ORAL_TABLET | Freq: Three times a day (TID) | ORAL | Status: DC

## 2013-03-06 NOTE — Discharge Summary (Signed)
Seen and examined - although I can't say we had much real communication.  Discussed with Dr. Berkley Harvey.  Agree with his management and documentation.  Mr Timothy Marks appears ready for DC.  He has not been easy to communicate with - so it is difficulty to get a true understanding of his level of pain.  Certainly he shows no signs of serious SS issue (no acute chest or sequestration symptoms.)  Agree with DC on oral meds today.

## 2013-03-06 NOTE — Progress Notes (Signed)
Discussed discharge instructions with pt; allowing him to ask questions. Pt showed no barriers to learning. Assessment unchanged from morning. Pt discharged home with family.

## 2013-03-06 NOTE — Discharge Instructions (Signed)
We strongly recommend that you call your primary care physician (The doctor listed on your Medicaid card) & a sickle cell specialists for appointments as soon as possible.  Below is information about the [facility] Sickle cell center that you requested, or you can follow up with your previous sickle cell doctor at [facility]  [place] [place], [place]  Main: [phone]    Sickle Cell Anemia, Adult Sickle cell anemia is a condition where your red blood cells are shaped like sickles. Red blood cells carry oxygen through the body. Sickle-shaped red blood cells cells do not live as long as normal red blood cells. They also clump together and block blood from flowing through the blood vessels. These things prevent the body from getting enough oxygen. Sickle cell anemia causes organ damage and pain. It also increases the risk of infection. HOME CARE  Drink enough fluid to keep your pee (urine) clear or pale yellow. Drink more in hot weather and during exercise.  Do not smoke. Smoking lowers oxygen levels in the blood.  Only take over-the-counter or prescription medicines as told by your doctor.  Take antibiotic medicines as told by your doctor. Make sure you finish them it even if you start to feel better.  Take supplements as told by your doctor.  Consider wearing a medical alert bracelet. This tells anyone caring for you in an emergency of your condition.  When traveling, keep your medical information, doctors' names, and the medicines you take with you at all times.  If you have a fever, do not take fever medicines right away. This could cover up a problem. Tell your doctor.   Keep all follow-up appointments with your doctor. Sickle cell anemia requires regular medical care. GET HELP IF: You have a fever. GET HELP RIGHT AWAY IF:  You feel dizzy or faint.  You have new belly (abdominal) pain, especially on the  left side near the stomach area.  You have a lasting, often uncomfortable and painful erection of the penis (priapism). If it is not treated right away, you will become unable to have sex (impotence).  You have numbness your arms or legs or you have a hard time moving them.  You have a hard time talking.  You have a fever or lasting symptoms for more than 2 3 days.  You have a fever and your symptoms suddenly get worse.  You have signs or symptoms of infection. These include:  Chills.  Being more tired than normal (lethargy).  Irritability.  Poor eating.  Throwing up (vomiting).  You have pain that is not helped with medicine.  You have shortness of breath.  You have pain in your chest.  You are coughing up pus-like or bloody mucus.  You have a stiff neck.  Your feet or hands swell or have pain.  Your belly looks bloated.  Your joints hurt. MAKE SURE YOU:  Understand these instructions.  Will watch your condition.  Will get help right away if you are not doing well or get worse. Document Released: 11/21/2012 Document Reviewed: 09/12/2012 [facility] Patient Information 2014 [place].

## 2013-03-06 NOTE — Discharge Summary (Signed)
[facility] Discharge Summary  Patient name: Timothy Marks Medical record number: [id number] Date of birth: [birth year] Age: 23 y.o. Gender: male Date of Admission: 03/03/2013  Date of Discharge: 03/06/13 Admitting Physician: Lupita Dawn, MD  Primary Care Provider: No PCP Per Patient Consultants: None  Indication for Hospitalization: Sickle cell crisis   Discharge Diagnoses/Problem List:  Sickle cell disease  Marijuana use  Disposition: Home  Discharge Condition: Stable  Discharge Exam: Filed Vitals:   03/06/13 1000  BP: 124/72  Pulse: 86  Temp: 98.2 F (36.8 C)  Resp: 18  General: Thin male; NAD Cardiovascular: RRR, No M/R/G  Respiratory: CTAB; Normal resp effort  Abdomen: Soft; Mild diffuse tenderness; No rigidity   Extremities: WWP, distal pulses intact to all four extremities  Brief Hospital Course: Timothy Marks is a 23 y.o. male who presented with Rt-sided abdominal/flank pain which he reported was consistent with his previous sickle cell crisis. PMH is significant for Sickle cell disease, and previous  cholecystectomy. He was initially treated with IV fluids and Dilaudid PCA then transitioned to PO pain medications once he was tolerating adequate PO intake. Timothy Marks expressed dissatisfaction with being taken off IV Dilaudid, however due to his somnolence and his subjective report of no improved pain control with the IV Dilaudid other pain treatment options were pursued. His pain improved with Morphine IR and he was discharged home. Prior to discharge, future sickle cell treatment options were discussed. He reported being previous treated at [facility] sickle cell clinic, but desires to have his care transferred to [facility]. Review of records revealed that his [place] declined to continue to give Scheduled 2 pain medication due to Timothy Marks's Marijuana use. Timothy Marks was advised to schedule an appointment with the primary care  physician listed on the medicaid card, as well as to schedule follow-up with a Sickle cell clinic of his choosing (options given). He was given Morphine IR #10 in case he developed a sickle cell crisis prior to establishing care.  Issues for Follow Up:   Help establish PCP and sickle cell specialist   Significant Procedures: None  Significant Labs and Imaging:   Recent Labs Lab 03/04/13 0510 03/05/13 0930 03/06/13 0500  WBC 11.6* 14.5* 11.6*  HGB 9.5* 10.0* 9.7*  HCT 27.7* 29.1* 28.3*  PLT 460* 455* 397    Recent Labs Lab 03/02/13 1010 03/03/13 1220 03/04/13 0510 03/05/13 0930 03/06/13 0500  NA 140 141 139 138 140  K 5.4* 4.5 4.1 4.2 4.3  CL 102 102 102 99 102  CO2 25 25 27 27 25   GLUCOSE 92 87 85 87 97  BUN 7 4* 4* 7 9  CREATININE 0.64 0.63 0.66 0.70 0.59  CALCIUM 8.9 9.0 8.9 9.4 9.5  ALKPHOS 84 94  --  97  --   AST 57* 30  --  36  --   ALT 14 11  --  11  --   ALBUMIN 4.0 3.9  --  4.3  --    Results/Tests Pending at Time of Discharge: None  Discharge Medications:    Medication List    STOP taking these medications       oxyCODONE-acetaminophen 10-325 MG per tablet  Commonly known as:  PERCOCET     oxyCODONE-acetaminophen 5-325 MG per tablet  Commonly known as:  PERCOCET/ROXICET      TAKE these medications       acetaminophen 500 MG tablet  Commonly known as:  TYLENOL  Take 2 tablets (1,000 mg total) by mouth every 8 (eight) hours.     folic acid 1 MG tablet  Commonly known as:  FOLVITE  Take 1 mg by mouth daily.     hydroxyurea 500 MG capsule  Commonly known as:  HYDREA  Take 1 capsule (500 mg total) by mouth daily. May take with food to minimize GI side effects.     ibuprofen 800 MG tablet  Commonly known as:  ADVIL,MOTRIN  Take 1 tablet (800 mg total) by mouth every 8 (eight) hours as needed.     morphine 15 MG tablet  Commonly known as:  MSIR  Take 1 tablet (15 mg total) by mouth every 2 (two) hours as needed for severe pain.         Discharge Instructions: Please refer to Patient Instructions section of EMR for full details.  Patient was counseled important signs and symptoms that should prompt return to medical care, changes in medications, dietary instructions, activity restrictions, and follow up appointments.   Follow-Up Appointments: Follow-up Information   Schedule an appointment as soon as possible for a visit with Call your primary care physcian on your Medicaid Card.      Schedule an appointment as soon as possible for a visit to follow up. (Sickle Cell Clinic at [facility]  [phone])       Phill Myron, MD 03/06/2013, 4:20 PM PGY-1, [place]

## 2013-03-30 ENCOUNTER — Encounter (HOSPITAL_COMMUNITY): Payer: Self-pay | Admitting: Emergency Medicine

## 2013-03-30 ENCOUNTER — Emergency Department (HOSPITAL_COMMUNITY)
Admission: EM | Admit: 2013-03-30 | Discharge: 2013-03-30 | Disposition: A | Discharge status: Home / Self Care | Payer: Medicaid Other | Attending: Emergency Medicine | Admitting: Emergency Medicine

## 2013-03-30 ENCOUNTER — Emergency Department (HOSPITAL_COMMUNITY): Payer: Medicaid Other

## 2013-03-30 DIAGNOSIS — Z79899 Other long term (current) drug therapy: Secondary | ICD-10-CM | POA: Insufficient documentation

## 2013-03-30 DIAGNOSIS — D5700 Hb-SS disease with crisis, unspecified: Secondary | ICD-10-CM

## 2013-03-30 LAB — HEPATIC FUNCTION PANEL
ALT: 10 U/L (ref 0–53)
AST: 29 U/L (ref 0–37)
Albumin: 4.6 g/dL (ref 3.5–5.2)
Alkaline Phosphatase: 80 U/L (ref 39–117)
Bilirubin, Direct: 0.4 mg/dL — ABNORMAL HIGH (ref 0.0–0.3)
Indirect Bilirubin: 2.9 mg/dL — ABNORMAL HIGH (ref 0.3–0.9)
Total Bilirubin: 3.3 mg/dL — ABNORMAL HIGH (ref 0.3–1.2)
Total Protein: 9.2 g/dL — ABNORMAL HIGH (ref 6.0–8.3)

## 2013-03-30 LAB — URINALYSIS, ROUTINE W REFLEX MICROSCOPIC
Bilirubin Urine: NEGATIVE
Glucose, UA: NEGATIVE mg/dL
Hgb urine dipstick: NEGATIVE
Ketones, ur: NEGATIVE mg/dL
Leukocytes, UA: NEGATIVE
Nitrite: NEGATIVE
Protein, ur: NEGATIVE mg/dL
Specific Gravity, Urine: 1.015 (ref 1.005–1.030)
Urobilinogen, UA: 1 mg/dL (ref 0.0–1.0)
pH: 7 (ref 5.0–8.0)

## 2013-03-30 LAB — CBC WITH DIFFERENTIAL/PLATELET
Basophils Absolute: 0 10*3/uL (ref 0.0–0.1)
Basophils Relative: 0 % (ref 0–1)
Eosinophils Absolute: 0.1 10*3/uL (ref 0.0–0.7)
Eosinophils Relative: 1 % (ref 0–5)
HCT: 25.6 % — ABNORMAL LOW (ref 39.0–52.0)
Hemoglobin: 9.1 g/dL — ABNORMAL LOW (ref 13.0–17.0)
Lymphocytes Relative: 29 % (ref 12–46)
Lymphs Abs: 3.7 10*3/uL (ref 0.7–4.0)
MCH: 28.7 pg (ref 26.0–34.0)
MCHC: 35.5 g/dL (ref 30.0–36.0)
MCV: 80.8 fL (ref 78.0–100.0)
Monocytes Absolute: 1.3 10*3/uL — ABNORMAL HIGH (ref 0.1–1.0)
Monocytes Relative: 10 % (ref 3–12)
Neutro Abs: 7.7 10*3/uL (ref 1.7–7.7)
Neutrophils Relative %: 60 % (ref 43–77)
Platelets: 602 10*3/uL — ABNORMAL HIGH (ref 150–400)
RBC: 3.17 MIL/uL — ABNORMAL LOW (ref 4.22–5.81)
RDW: 21.3 % — ABNORMAL HIGH (ref 11.5–15.5)
WBC: 12.8 10*3/uL — ABNORMAL HIGH (ref 4.0–10.5)

## 2013-03-30 LAB — RETICULOCYTES
RBC.: 3.17 MIL/uL — ABNORMAL LOW (ref 4.22–5.81)
Retic Count, Absolute: 358.2 10*3/uL — ABNORMAL HIGH (ref 19.0–186.0)
Retic Ct Pct: 11.3 % — ABNORMAL HIGH (ref 0.4–3.1)

## 2013-03-30 MED ORDER — OXYCODONE-ACETAMINOPHEN 5-325 MG PO TABS: 1.0000 | ORAL_TABLET | Freq: Once | ORAL | Status: DC

## 2013-03-30 MED ORDER — ONDANSETRON HCL 4 MG/2ML IJ SOLN
4.0000 mg | Freq: Once | INTRAMUSCULAR | Status: AC
  Administered 2013-03-30: 4 mg via INTRAVENOUS
  Filled 2013-03-30: qty 2

## 2013-03-30 MED ORDER — HYDROMORPHONE HCL PF 1 MG/ML IJ SOLN
2.0000 mg | Freq: Once | INTRAMUSCULAR | Status: AC
  Administered 2013-03-30: 2 mg via INTRAVENOUS
  Filled 2013-03-30: qty 2

## 2013-03-30 MED ORDER — OXYCODONE-ACETAMINOPHEN 10-325 MG PO TABS: 1.0000 | ORAL_TABLET | Freq: Four times a day (QID) | ORAL | Status: DC | PRN

## 2013-03-30 MED ORDER — SODIUM CHLORIDE 0.9 % IV BOLUS (SEPSIS)
1000.0000 mL | Freq: Once | INTRAVENOUS | Status: AC
  Administered 2013-03-30: 1000 mL via INTRAVENOUS

## 2013-03-30 MED ORDER — HYDROMORPHONE HCL PF 1 MG/ML IJ SOLN
1.0000 mg | Freq: Once | INTRAMUSCULAR | Status: AC
  Administered 2013-03-30: 1 mg via INTRAVENOUS
  Filled 2013-03-30: qty 1

## 2013-03-30 NOTE — ED Notes (Signed)
MD at bedside. EDP [place] 

## 2013-03-30 NOTE — ED Notes (Signed)
Support person in room [place] because Pt will be observed after for 62mins. After Dilaudid IV.

## 2013-03-30 NOTE — Discharge Instructions (Signed)
As discussed, it is very important that you followup at the sickle cell clinic in 2 days the telephone to ensure appropriate ongoing care.  Additional narcotics needed to come from that center.  Return here for concerning changes in your condition.    Sickle Cell Anemia, Adult Sickle cell anemia is a condition in which red blood cells have an abnormal "sickle" shape. This abnormal shape shortens the cells' life span, which results in a lower than normal concentration of red blood cells in the blood. The sickle shape also causes the cells to clump together and block free blood flow through the blood vessels. As a result, the tissues and organs of the body do not receive enough oxygen. Sickle cell anemia causes organ damage and pain and increases the risk of infection. CAUSES  Sickle cell anemia is a genetic disorder. Those who receive two copies of the gene have the condition, and those who receive one copy have the trait. RISK FACTORS The sickle cell gene is most common in people whose families originated in [place]. Other areas of the globe where sickle cell trait occurs include the [place], [place] and [place], and the [place].  SIGNS AND SYMPTOMS  Pain, especially in the extremities, back, chest, or abdomen (common). The pain may start suddenly or may develop following an illness, especially if there is dehydration. Pain can also occur due to overexertion or exposure to extreme temperature changes.  Frequent severe bacterial infections, especially certain types of pneumonia and meningitis.  Pain and swelling in the hands and feet.  Decreased activity.   Loss of appetite.   Change in behavior.  Headaches.  Seizures.  Shortness of breath or difficulty breathing.  Vision changes.  Skin ulcers. Those with the trait may not have symptoms or they may have mild symptoms.  DIAGNOSIS  Sickle cell anemia is diagnosed with blood tests that demonstrate the  genetic trait. It is often diagnosed during the newborn period, due to mandatory testing nationwide. A variety of blood tests, X-rays, CT scans, MRI scans, ultrasounds, and lung function tests may also be done to monitor the condition. TREATMENT  Sickle cell anemia may be treated with:  Medicines. You may be given pain medicines, antibiotic medicines (to treat and prevent infections) or medicines to increase the production of certain types of hemoglobin.  Fluids.  Oxygen.  Blood transfusions. HOME CARE INSTRUCTIONS   Drink enough fluid to keep your urine clear or pale yellow. Increase your fluid intake in hot weather and during exercise.  Do not smoke. Smoking lowers oxygen levels in the blood.   Only take over-the-counter or prescription medicines for pain, fever, or discomfort as directed by your health care provider.  Take antibiotics as directed by your health care provider. Make sure you finish them it even if you start to feel better.   Take supplements as directed by your health care provider.   Consider wearing a medical alert bracelet. This tells anyone caring for you in an emergency of your condition.   When traveling, keep your medical information, health care provider's names, and the medicines you take with you at all times.   If you develop a fever, do not take medicines to reduce the fever right away. This could cover up a problem that is developing. Notify your health care provider.  Keep all follow-up appointments with your health care provider. Sickle cell anemia requires regular medical care. SEEK MEDICAL CARE IF: You have a fever. [place]  CARE IF:   You feel dizzy or faint.   You have new abdominal pain, especially on the left side near the stomach area.   You develop a persistent, often uncomfortable and painful penile erection (priapism). If this is not treated immediately it will lead to impotence.   You have numbness your arms or  legs or you have a hard time moving them.   You have a hard time with speech.   You have a fever or persistent symptoms for more than 2 3 days.   You have a fever and your symptoms suddenly get worse.   You have signs or symptoms of infection. These include:   Chills.   Abnormal tiredness (lethargy).   Irritability.   Poor eating.   Vomiting.   You develop pain that is not helped with medicine.   You develop shortness of breath.  You have pain in your chest.   You are coughing up pus-like or bloody sputum.   You develop a stiff neck.  Your feet or hands swell or have pain.  Your abdomen appears bloated.  You develop joint pain. MAKE SURE YOU:  Understand these instructions.  Will watch your child's condition.  Will get help right away if your child is not doing well or gets worse. Document Released: 05/11/2005 Document Revised: 11/21/2012 Document Reviewed: 09/12/2012 [facility] Patient Information 2014 [place], [place].

## 2013-03-30 NOTE — ED Notes (Signed)
Pt in stating he is having a sickle cell pain crisis, c/o pain to lower back which he says is typical for his crisis, no relief with home medication, denies other pain locations at this time, denies shortness of breath, no distress noted.

## 2013-03-30 NOTE — ED Provider Notes (Signed)
CSN: [id number]     Arrival date & time 03/30/13  1428 History   None    Chief Complaint  Patient presents with  . Sickle Cell Pain Crisis   HPI Comments: 23 yo M hx of sickle cell presents with CC of pain crisis.  Pt states pain started 4 hours ago.  He states he has lower back back, and some right sided chest wall pain, similar to previous pain crisis.  Pain is sharp, nonradiating, moderate in severity.  He denies fever, cough, SOB, abdominal pain, nausea, vomiting, diarrhea, myalgias, headache, focal deficits, or any other symptoms.  Pt has attempted Percocets for pain, which he had prescribed for recent mandibular fx, but these have not been working.  He states he has had difficulty eating, drinking given recent mandibular fx, and had his jaw wired a few weeks ago at [facility].  Of note pt has frequent visits to this ED for pain crisis, last visit was in January, for which he was admitted for symptomatic control.  Pt is not currently plugged into sickle cell clinic, however, has had discussions with [place] sickle cell clinic, and states they are supposed to call him with an appointment with a new provider.  Patient is a 23 y.o. male presenting with sickle cell pain. The history is provided by the patient. No language interpreter was used.  Sickle Cell Pain Crisis Location:  Back and chest Severity:  Moderate Onset quality:  Gradual Duration:  4 hours Similar to previous crisis episodes: yes   Timing:  Constant Progression:  Unchanged Chronicity:  Recurrent History of pulmonary emboli: no   Context: dehydration   Context: not change in medication, not infection, not non-compliance and not stress   Relieved by:  Nothing Worsened by:  Movement Ineffective treatments:  Prescription drugs Associated symptoms: chest pain   Associated symptoms: no congestion, no cough, no fatigue, no fever, no headaches, no nausea, no shortness of breath, no swelling of legs and no vomiting   Risk  factors: frequent admissions for pain and frequent pain crises     Past Medical History  Diagnosis Date  . Sickle cell anemia    History reviewed. No pertinent past surgical history. History reviewed. No pertinent family history. History  Substance Use Topics  . Smoking status: Never Smoker   . Smokeless tobacco: Never Used  . Alcohol Use: No    Review of Systems  Constitutional: Negative for fever and fatigue.  HENT: Negative for congestion.   Respiratory: Negative for cough and shortness of breath.   Cardiovascular: Positive for chest pain. Negative for palpitations and leg swelling.  Gastrointestinal: Negative for nausea and vomiting.  Musculoskeletal: Positive for back pain. Negative for arthralgias, joint swelling, myalgias, neck pain and neck stiffness.  Neurological: Negative for dizziness, weakness, light-headedness, numbness and headaches.  Hematological: Negative for adenopathy. Does not bruise/bleed easily.  All other systems reviewed and are negative.      Allergies  Toradol and Tramadol  Home Medications   Current Outpatient Rx  Name  Route  Sig  Dispense  Refill  . acetaminophen (TYLENOL) 500 MG tablet   Oral   Take 2 tablets (1,000 mg total) by mouth every 8 (eight) hours.   30 tablet   0   . folic acid (FOLVITE) 1 MG tablet   Oral   Take 1 mg by mouth daily.         . hydroxyurea (HYDREA) 500 MG capsule   Oral   Take 1  capsule (500 mg total) by mouth daily. May take with food to minimize GI side effects.   30 capsule   3   . morphine (MSIR) 15 MG tablet   Oral   Take 1 tablet (15 mg total) by mouth every 2 (two) hours as needed for severe pain.   10 tablet   0   . oxyCODONE-acetaminophen (PERCOCET) 10-325 MG per tablet   Oral   Take 1 tablet by mouth every 6 (six) hours as needed for pain.          BP 124/70  Pulse 86  Temp(Src) 97.5 F (36.4 C) (Axillary)  Resp 20  Ht 6\' 2"  (1.88 m)  Wt 135 lb (61.236 kg)  BMI 17.33 kg/m2   SpO2 100% Physical Exam  Nursing note and vitals reviewed. Constitutional: He is oriented to person, place, and time. He appears well-developed and well-nourished.  HENT:  Head: Normocephalic and atraumatic.  Right Ear: External ear normal.  Left Ear: External ear normal.  Mouth/Throat: Oropharynx is clear and moist.  Eyes: EOM are normal. Pupils are equal, round, and reactive to light.  Neck: Normal range of motion. Neck supple.  Cardiovascular: Normal rate, regular rhythm, normal heart sounds and intact distal pulses.   Pulmonary/Chest: Effort normal and breath sounds normal. No respiratory distress. He has no wheezes. He has no rales. He exhibits tenderness.  Right chest wall TTP.  Abdominal: Soft. Bowel sounds are normal. He exhibits no distension and no mass. There is no tenderness. There is no rebound and no guarding.  Musculoskeletal: Normal range of motion. He exhibits tenderness.  TTP lumbar back.  Normal ROM.  No deformities.  Neurological: He is alert and oriented to person, place, and time.  Skin: Skin is warm and dry.    ED Course  Procedures (including critical care time) Labs Review Labs Reviewed  CBC WITH DIFFERENTIAL - Abnormal; Notable for the following:    WBC 12.8 (*)    RBC 3.17 (*)    Hemoglobin 9.1 (*)    HCT 25.6 (*)    RDW 21.3 (*)    Platelets 602 (*)    Monocytes Absolute 1.3 (*)    All other components within normal limits  RETICULOCYTES - Abnormal; Notable for the following:    Retic Ct Pct 11.3 (*)    RBC. 3.17 (*)    Retic Count, Manual 358.2 (*)    All other components within normal limits  HEPATIC FUNCTION PANEL - Abnormal; Notable for the following:    Total Protein 9.2 (*)    Total Bilirubin 3.3 (*)    Bilirubin, Direct 0.4 (*)    Indirect Bilirubin 2.9 (*)    All other components within normal limits  URINALYSIS, ROUTINE W REFLEX MICROSCOPIC - Abnormal; Notable for the following:    Color, Urine AMBER (*)    All other components  within normal limits   Imaging Review Dg Chest 2 View  03/30/2013   CLINICAL DATA:  Sickle cell pain.  EXAM: CHEST  2 VIEW  COMPARISON:  February 03, 2013.  FINDINGS: The heart size and mediastinal contours are within normal limits. Both lungs are clear. No pleural effusion or pneumothorax is noted. The visualized skeletal structures are unremarkable.  IMPRESSION: No active cardiopulmonary disease.   Electronically Signed   By: Sabino Dick M.D.   On: 03/30/2013 16:22    EKG Interpretation    Date/Time:  Saturday March 30 2013 16:03:45 EST Ventricular Rate:  83 PR Interval:  149 QRS Duration: 103 QT Interval:  370 QTC Calculation: 435 R Axis:   77 Text Interpretation:  Sinus rhythm ST elev, probable normal early repol pattern Sinus rhythm Early repolarization Artifact No significant change since last tracing Abnormal ekg Confirmed by Carmin Muskrat  MD [phone]) on 03/30/2013 4:09:38 PM            MDM   Final diagnoses:  None   23 yo M hx of sickle cell presents with CC of pain crisis.  Filed Vitals:   03/30/13 1451  BP: 124/70  Pulse: 86  Temp:   Resp:    Physical exam as above.  Pt with typical pain crisis.  Given some CP, will obtain CXR and EKG.  Low suspision for acute chest syndrome.  Will obtain CBC, reticulocyte counts.  Will hydrate, IV pain meds, zofran, and reevaluate.  EKG NSR, normal early repolarizaton.  CXR WNL.  CBC shows stable WBC, Hgb from previous.  Reticulocytes 11.3, stable from previous.  LFTx shows T bili 3.3, D bili 0.4, Indirect bili 2.9, again which are stable from previous.  Pt received total of 7 mg Dilaudid, NS bolus, 4 mg Zofran with improvement in symptoms.    Pt to be d/c home in good condition.  Encouraged to continue supportive care.  F/u with PCP in 1 week, and [place] sickle cell clinic as scheduled.  Return precautions given.  Pt understands and agrees with plan.  I have discussed pt's care plan with Dr. Vanita Panda.  Sinda Du,  MD    Sinda Du, MD 03/31/13 [phone]

## 2013-03-30 NOTE — ED Notes (Addendum)
PT friend now request MR. Kinzler to be sent home. Pt wants to think about it. Pt instructed that EDP is with another PT and will  See him as soon as possible.

## 2013-03-31 NOTE — ED Provider Notes (Addendum)
This patient was seen / evaluated in conjunction with the resident physician, Dr. Justin Mend.  The documentation is an accurate reflection of the encounter, and our thoughts / the patient's course.  On my exam, this patient was in no distress, though he continued to complain of pain. Patient had already received multiple rounds of dialogue, had no notable change in hemodynamics, and labs were essentially normal for him. With no distress, stable vital signs, and improvement in his pain, patient was discharged, per his request with short course of narcotics to follow up with our local sickle cell clinic, as the patient has recently left his prior clinic in [place]. I counseled the patient, and informed him on his discharge papers, that additional narcotics should come from a primary care physician.  I saw the patient's EKG, rate 83 unremarkable, please see my electronic documentation.   Carmin Muskrat, MD 03/31/13 1658  Carmin Muskrat, MD 03/31/13 [place], MD 04/10/13 [phone]

## 2013-04-10 ENCOUNTER — Telehealth: Payer: Self-pay | Admitting: General Practice

## 2013-04-10 NOTE — Telephone Encounter (Signed)
Spoke with patient regarding cancellation of appointment for 04/18/13 at [place] patient once new patient paperwork is reviewed and approved, an appointment can be scheduled.

## 2013-04-18 ENCOUNTER — Ambulatory Visit: Payer: Medicaid Other | Admitting: Internal Medicine

## 2013-04-24 ENCOUNTER — Emergency Department (HOSPITAL_COMMUNITY)
Admission: EM | Admit: 2013-04-24 | Discharge: 2013-04-25 | Disposition: A | Discharge status: Home / Self Care | Payer: Medicaid Other | Attending: Emergency Medicine | Admitting: Emergency Medicine

## 2013-04-24 ENCOUNTER — Encounter (HOSPITAL_COMMUNITY): Payer: Self-pay | Admitting: Emergency Medicine

## 2013-04-24 DIAGNOSIS — Z79899 Other long term (current) drug therapy: Secondary | ICD-10-CM | POA: Insufficient documentation

## 2013-04-24 DIAGNOSIS — M545 Low back pain, unspecified: Secondary | ICD-10-CM | POA: Insufficient documentation

## 2013-04-24 DIAGNOSIS — D5700 Hb-SS disease with crisis, unspecified: Secondary | ICD-10-CM

## 2013-04-24 LAB — CBC WITH DIFFERENTIAL/PLATELET
Basophils Absolute: 0 10*3/uL (ref 0.0–0.1)
Basophils Relative: 0 % (ref 0–1)
Eosinophils Absolute: 0.1 10*3/uL (ref 0.0–0.7)
Eosinophils Relative: 1 % (ref 0–5)
HCT: 25.1 % — ABNORMAL LOW (ref 39.0–52.0)
Hemoglobin: 8.8 g/dL — ABNORMAL LOW (ref 13.0–17.0)
Lymphocytes Relative: 50 % — ABNORMAL HIGH (ref 12–46)
Lymphs Abs: 4.7 10*3/uL — ABNORMAL HIGH (ref 0.7–4.0)
MCH: 29.5 pg (ref 26.0–34.0)
MCHC: 35.1 g/dL (ref 30.0–36.0)
MCV: 84.2 fL (ref 78.0–100.0)
Monocytes Absolute: 0.8 10*3/uL (ref 0.1–1.0)
Monocytes Relative: 9 % (ref 3–12)
Neutro Abs: 3.8 10*3/uL (ref 1.7–7.7)
Neutrophils Relative %: 40 % — ABNORMAL LOW (ref 43–77)
Platelets: 108 10*3/uL — ABNORMAL LOW (ref 150–400)
RBC: 2.98 MIL/uL — ABNORMAL LOW (ref 4.22–5.81)
RDW: 19.3 % — ABNORMAL HIGH (ref 11.5–15.5)
WBC: 9.4 10*3/uL (ref 4.0–10.5)

## 2013-04-24 LAB — RETICULOCYTES
RBC.: 2.98 MIL/uL — ABNORMAL LOW (ref 4.22–5.81)
Retic Count, Absolute: 336.7 10*3/uL — ABNORMAL HIGH (ref 19.0–186.0)
Retic Ct Pct: 11.3 % — ABNORMAL HIGH (ref 0.4–3.1)

## 2013-04-24 MED ORDER — HYDROMORPHONE HCL PF 1 MG/ML IJ SOLN
1.5000 mg | Freq: Once | INTRAMUSCULAR | Status: AC
  Administered 2013-04-24: 1.5 mg via INTRAVENOUS
  Filled 2013-04-24: qty 2

## 2013-04-24 MED ORDER — ONDANSETRON HCL 4 MG/2ML IJ SOLN
4.0000 mg | Freq: Once | INTRAMUSCULAR | Status: AC
  Administered 2013-04-24: 4 mg via INTRAVENOUS
  Filled 2013-04-24: qty 2

## 2013-04-24 MED ORDER — SODIUM CHLORIDE 0.9 % IV BOLUS (SEPSIS)
1000.0000 mL | Freq: Once | INTRAVENOUS | Status: AC
  Administered 2013-04-24: 1000 mL via INTRAVENOUS

## 2013-04-24 NOTE — ED Notes (Signed)
Made 2 attempts to gain IV access. Unsuccessful, labs were drawn. Another RN to obtain site.

## 2013-04-24 NOTE — ED Notes (Signed)
Multiple attempts to start IV. Unsuccessful.

## 2013-04-24 NOTE — ED Notes (Signed)
The pt takes percocet 10mg  for his pain and he ran out of that med yesterday

## 2013-04-24 NOTE — ED Notes (Signed)
The pt is c/o back pain as he plays games on his cell.  He reports that he has sickle cell .  He is  Usually seen at [facility].  He known about the clinic at [facility] long

## 2013-04-24 NOTE — ED Notes (Signed)
Pt states that immediatly after getting off of a zip line for vacation today the pain began. Only takes the pain med when needed. Currently out of meds.

## 2013-04-24 NOTE — ED Provider Notes (Signed)
CSN: [id number]     Arrival date & time 04/24/13  1840 History   First MD Initiated Contact with Patient 04/24/13 1937     Chief Complaint  Patient presents with  . Sickle Cell Pain Crisis     (Consider location/radiation/quality/duration/timing/severity/associated sxs/prior Treatment) HPI Comments: 23 yo male with SCA, aggressive behavior, normally goes to [facility] presents with lower back pain, similar to multiple previous sickle crisis.  No fevers/ sob or cp.  Patient denies urinary or bowel changes, active cancer, extremity weakness, IVDU, fevers, immunosuppression or significant trauma. Pain constant, worse with movement the past few days.   Patient is a 23 y.o. male presenting with sickle cell pain. The history is provided by the patient.  Sickle Cell Pain Crisis Associated symptoms: no chest pain, no congestion, no fever, no headaches, no shortness of breath and no vomiting     Past Medical History  Diagnosis Date  . Sickle cell anemia    History reviewed. No pertinent past surgical history. No family history on file. History  Substance Use Topics  . Smoking status: Never Smoker   . Smokeless tobacco: Never Used  . Alcohol Use: No    Review of Systems  Constitutional: Negative for fever and chills.  HENT: Negative for congestion.   Eyes: Negative for visual disturbance.  Respiratory: Negative for shortness of breath.   Cardiovascular: Negative for chest pain.  Gastrointestinal: Negative for vomiting and abdominal pain.  Genitourinary: Negative for dysuria and flank pain.  Musculoskeletal: Positive for back pain. Negative for neck pain and neck stiffness.  Skin: Negative for rash.  Neurological: Negative for light-headedness and headaches.      Allergies  Toradol; Tramadol; and Vancomycin  Home Medications   Current Outpatient Rx  Name  Route  Sig  Dispense  Refill  . folic acid (FOLVITE) 1 MG tablet   Oral   Take 1 mg by mouth daily.         .  hydroxyurea (HYDREA) 500 MG capsule   Oral   Take 1 capsule (500 mg total) by mouth daily. May take with food to minimize GI side effects.   30 capsule   3   . morphine (MSIR) 15 MG tablet   Oral   Take 1 tablet (15 mg total) by mouth every 2 (two) hours as needed for severe pain.   10 tablet   0   . oxyCODONE-acetaminophen (PERCOCET) 10-325 MG per tablet   Oral   Take 1 tablet by mouth every 6 (six) hours as needed for pain.   15 tablet   0   . Vitamin D, Ergocalciferol, (DRISDOL) 50000 UNITS CAPS capsule   Oral   Take 50,000 Units by mouth every Monday.           BP 110/65  Pulse 70  Temp(Src) 97.6 F (36.4 C) (Axillary)  Wt 125 lb (56.7 kg)  SpO2 100% Physical Exam  Nursing note and vitals reviewed. Constitutional: He is oriented to person, place, and time. He appears well-developed and well-nourished.  HENT:  Head: Normocephalic and atraumatic.  Eyes: Conjunctivae are normal. Right eye exhibits no discharge. Left eye exhibits no discharge.  Neck: Normal range of motion. Neck supple. No tracheal deviation present.  Cardiovascular: Normal rate and regular rhythm.   Pulmonary/Chest: Effort normal and breath sounds normal.  Abdominal: Soft. He exhibits no distension. There is no tenderness. There is no guarding.  Musculoskeletal: He exhibits tenderness (lumbar paraspinal, mild mildline, no step off). He exhibits no  edema.  Neurological: He is alert and oriented to person, place, and time. GCS eye subscore is 4. GCS verbal subscore is 5. GCS motor subscore is 6.  Reflex Scores:      Patellar reflexes are 2+ on the right side and 2+ on the left side.      Achilles reflexes are 2+ on the right side and 2+ on the left side. 5+ strength in UE and LE with f/e at major joints. Sensation to palpation intact in UE and LE. CNs 2-12 grossly intact.  EOMFI.  PERRL.     Skin: Skin is warm. No rash noted.  Psychiatric: He has a normal mood and affect.    ED Course  Procedures  (including critical care time) Emergency Ultrasound Study:   Angiocath insertion Performed by: Mariea Clonts  Consent: Verbal consent obtained. Risks and benefits: risks, benefits and alternatives were discussed Immediately prior to procedure the correct patient, procedure, equipment, support staff and site/side marked as needed.  Indication: difficult IV access Preparation: Patient was prepped and draped in the usual sterile fashion. Vein Location: basilic right vein was visualized during assessment for potential access sites and was found to be patent/ easily compressed with linear ultrasound.  The needle was visualized with real-time ultrasound and guided into the vein. Gauge: 20 g  Image saved and stored.  Normal blood return.  Patient tolerance: Patient tolerated the procedure well with no immediate complications.  IV infiltrated, required second IV  Emergency Ultrasound Study:   Angiocath insertion Performed by: Mariea Clonts  Consent: Verbal consent obtained. Risks and benefits: risks, benefits and alternatives were discussed Immediately prior to procedure the correct patient, procedure, equipment, support staff and site/side marked as needed.  Indication: difficult IV access Preparation: Patient was prepped and draped in the usual sterile fashion. Vein Location: right basilic vein was visualized during assessment for potential access sites and was found to be patent/ easily compressed with linear ultrasound.  The needle was visualized with real-time ultrasound and guided into the vein. Gauge: 20 g  Image saved and stored.  Normal blood return.  Patient tolerance: Patient tolerated the procedure well with no immediate complications.     Labs Review Labs Reviewed  CBC WITH DIFFERENTIAL - Abnormal; Notable for the following:    RBC 2.98 (*)    Hemoglobin 8.8 (*)    HCT 25.1 (*)    RDW 19.3 (*)    Platelets 108 (*)    Neutrophils Relative % 40 (*)     Lymphocytes Relative 50 (*)    Lymphs Abs 4.7 (*)    All other components within normal limits  RETICULOCYTES - Abnormal; Notable for the following:    Retic Ct Pct 11.3 (*)    RBC. 2.98 (*)    Retic Count, Manual 336.7 (*)    All other components within normal limits  COMPREHENSIVE METABOLIC PANEL   Imaging Review No results found.   EKG Interpretation None      MDM   Final diagnoses:  Sickle cell crisis  Low back pain   Well appearing, playing on phone.   Similar to previous, no red flags. Pain meds, fluids, labs.  Recheck, pt feels pain not improved.  Second round of pain medicines.  Pt feels pain minimal improved.  Plan for observation/ transfer to [facility] for pain control/ [place] pain crisis.  Discussed with patient, I could not guarantee that the medicine dr would have him placed in the clinic or give him narcotics  for outpt pain management.  Pt decided to go home and fup outpt instead of transfer to [facility].  Pain improved in ED after 3 doses.  Filed Vitals:   04/24/13 2130 04/24/13 2200 04/24/13 2230 04/24/13 2300  BP: 112/73 109/69 111/62 114/64  Pulse: 55 62 58 66  Temp:      TempSrc:      Weight:      SpO2: 97% 100% 100% 99%   Pt was seen in the past at [facility], no current physician, in process of joining local sickle clinic. Spoke with Dr Posey Pronto, requested call back after CMP returns.        Mariea Clonts, MD 04/25/13 [phone]

## 2013-04-25 MED ORDER — HYDROMORPHONE HCL PF 1 MG/ML IJ SOLN: 1.0000 mg | Freq: Once | INTRAMUSCULAR | Status: DC

## 2013-04-25 MED ORDER — HYDROMORPHONE HCL PF 1 MG/ML IJ SOLN
1.0000 mg | Freq: Once | INTRAMUSCULAR | Status: AC
  Administered 2013-04-25: 1 mg via INTRAVENOUS
  Filled 2013-04-25: qty 1

## 2013-04-25 NOTE — ED Notes (Signed)
Offered transfer to [facility] and patient refused. Offered multiple times. Pt still refusing transfer and requesting to leave. Dr. Reather Converse at bedside.

## 2013-04-25 NOTE — ED Notes (Signed)
Dr Zavitz at bedside  

## 2013-04-25 NOTE — Discharge Instructions (Signed)
If you were given medicines take as directed.  If you are on coumadin or contraceptives realize their levels and effectiveness is altered by many different medicines.  If you have any reaction (rash, tongues swelling, other) to the medicines stop taking and see a physician.   Please follow up as directed and return to the ER or see a physician for new or worsening symptoms.  Thank you. Call Sickle Cell clinic at [phone] or  [phone] or the number you have for appointment.

## 2013-05-03 ENCOUNTER — Encounter (HOSPITAL_COMMUNITY): Payer: Self-pay | Admitting: Emergency Medicine

## 2013-05-03 ENCOUNTER — Emergency Department (HOSPITAL_COMMUNITY): Payer: Medicaid Other

## 2013-05-03 ENCOUNTER — Emergency Department (HOSPITAL_COMMUNITY)
Admission: EM | Admit: 2013-05-03 | Discharge: 2013-05-03 | Disposition: A | Discharge status: Home / Self Care | Payer: Medicaid Other | Attending: Emergency Medicine | Admitting: Emergency Medicine

## 2013-05-03 DIAGNOSIS — S02609A Fracture of mandible, unspecified, initial encounter for closed fracture: Secondary | ICD-10-CM | POA: Insufficient documentation

## 2013-05-03 DIAGNOSIS — M545 Low back pain, unspecified: Secondary | ICD-10-CM

## 2013-05-03 DIAGNOSIS — D5700 Hb-SS disease with crisis, unspecified: Secondary | ICD-10-CM | POA: Insufficient documentation

## 2013-05-03 DIAGNOSIS — R011 Cardiac murmur, unspecified: Secondary | ICD-10-CM | POA: Insufficient documentation

## 2013-05-03 DIAGNOSIS — Z79899 Other long term (current) drug therapy: Secondary | ICD-10-CM | POA: Insufficient documentation

## 2013-05-03 LAB — CBC
HCT: 25.1 % — ABNORMAL LOW (ref 39.0–52.0)
Hemoglobin: 9 g/dL — ABNORMAL LOW (ref 13.0–17.0)
MCH: 30 pg (ref 26.0–34.0)
MCHC: 35.9 g/dL (ref 30.0–36.0)
MCV: 83.7 fL (ref 78.0–100.0)
Platelets: 432 10*3/uL — ABNORMAL HIGH (ref 150–400)
RBC: 3 MIL/uL — ABNORMAL LOW (ref 4.22–5.81)
RDW: 17.7 % — ABNORMAL HIGH (ref 11.5–15.5)
WBC: 8.4 10*3/uL (ref 4.0–10.5)

## 2013-05-03 LAB — RETICULOCYTES
RBC.: 3 MIL/uL — ABNORMAL LOW (ref 4.22–5.81)
Retic Count, Absolute: 294 10*3/uL — ABNORMAL HIGH (ref 19.0–186.0)
Retic Ct Pct: 9.8 % — ABNORMAL HIGH (ref 0.4–3.1)

## 2013-05-03 LAB — COMPREHENSIVE METABOLIC PANEL
ALT: 12 U/L (ref 0–53)
AST: 30 U/L (ref 0–37)
Albumin: 3.7 g/dL (ref 3.5–5.2)
Alkaline Phosphatase: 75 U/L (ref 39–117)
BUN: <3 mg/dL — ABNORMAL LOW (ref 6–23)
CO2: 26 mEq/L (ref 19–32)
Calcium: 8.7 mg/dL (ref 8.4–10.5)
Chloride: 107 mEq/L (ref 96–112)
Creatinine, Ser: 0.56 mg/dL (ref 0.50–1.35)
GFR calc Af Amer: >90 mL/min (ref >90)
GFR calc non Af Amer: >90 mL/min (ref >90)
Glucose, Bld: 94 mg/dL (ref 70–99)
Potassium: 3.6 mEq/L — ABNORMAL LOW (ref 3.7–5.3)
Sodium: 144 mEq/L (ref 137–147)
Total Bilirubin: 2.6 mg/dL — ABNORMAL HIGH (ref 0.3–1.2)
Total Protein: 7.1 g/dL (ref 6.0–8.3)

## 2013-05-03 MED ORDER — HYDROMORPHONE HCL PF 1 MG/ML IJ SOLN
2.0000 mg | Freq: Once | INTRAMUSCULAR | Status: AC
  Administered 2013-05-03: 2 mg via INTRAVENOUS
  Filled 2013-05-03: qty 2

## 2013-05-03 MED ORDER — SODIUM CHLORIDE 0.9 % IV BOLUS (SEPSIS)
1000.0000 mL | Freq: Once | INTRAVENOUS | Status: AC
  Administered 2013-05-03: 1000 mL via INTRAVENOUS

## 2013-05-03 MED ORDER — ONDANSETRON HCL 4 MG/2ML IJ SOLN
4.0000 mg | Freq: Once | INTRAMUSCULAR | Status: AC
  Administered 2013-05-03: 4 mg via INTRAVENOUS
  Filled 2013-05-03: qty 2

## 2013-05-03 MED ORDER — HYDROMORPHONE HCL PF 1 MG/ML IJ SOLN
1.0000 mg | Freq: Once | INTRAMUSCULAR | Status: AC
  Administered 2013-05-03: 1 mg via INTRAVENOUS
  Filled 2013-05-03: qty 1

## 2013-05-03 MED ORDER — OXYCODONE-ACETAMINOPHEN 5-325 MG PO TABS: 1.0000 | ORAL_TABLET | ORAL | Status: DC | PRN

## 2013-05-03 NOTE — ED Provider Notes (Signed)
CSN: [id number]     Arrival date & time 05/03/13  1506 History   First MD Initiated Contact with Patient 05/03/13 1728     Chief Complaint  Patient presents with  . Sickle Cell Pain Crisis     (Consider location/radiation/quality/duration/timing/severity/associated sxs/prior Treatment) HPI Comments: Timothy Marks is a 23 y.o. male with a past medical history of SCA, presenting the Emergency Department with a chief complaint of low back pain since this morning. He reports pain with similar [place] pain episodes in the past.  He denies, chest pain, rib pain, SOB, fever or chills, nausea or vomiting, abdominal pain.  The patient reports taking 1 percocet without relief of his symptoms The patient has not established care for his sickle cell and is still waiting on "[facility] to fax over paperwork". No PCP  Patient is a 23 y.o. male presenting with sickle cell pain. The history is provided by the patient and medical records. No language interpreter was used.  Sickle Cell Pain Crisis Location:  Back Associated symptoms: no chest pain, no cough, no fever, no shortness of breath and no wheezing     Past Medical History  Diagnosis Date  . Sickle cell anemia    History reviewed. No pertinent past surgical history. History reviewed. No pertinent family history. History  Substance Use Topics  . Smoking status: Never Smoker   . Smokeless tobacco: Never Used  . Alcohol Use: No    Review of Systems  Constitutional: Negative for fever and chills.  Respiratory: Negative for cough, chest tightness, shortness of breath and wheezing.   Cardiovascular: Negative for chest pain and palpitations.  Gastrointestinal: Negative for abdominal pain.  Genitourinary: Negative for flank pain and difficulty urinating.  Musculoskeletal: Positive for back pain.      Allergies  Toradol; Tramadol; and Vancomycin  Home Medications   Current Outpatient Rx  Name  Route  Sig  Dispense  Refill  . folic acid  (FOLVITE) 1 MG tablet   Oral   Take 1 mg by mouth daily.         . hydroxyurea (HYDREA) 500 MG capsule   Oral   Take 1 capsule (500 mg total) by mouth daily. May take with food to minimize GI side effects.   30 capsule   3   . morphine (MSIR) 15 MG tablet   Oral   Take 1 tablet (15 mg total) by mouth every 2 (two) hours as needed for severe pain.   10 tablet   0   . oxyCODONE-acetaminophen (PERCOCET) 10-325 MG per tablet   Oral   Take 1 tablet by mouth every 6 (six) hours as needed for pain.   15 tablet   0   . Vitamin D, Ergocalciferol, (DRISDOL) 50000 UNITS CAPS capsule   Oral   Take 50,000 Units by mouth every Monday.           BP 119/71  Pulse 68  Temp(Src) 98.4 F (36.9 C) (Oral)  Resp 14  SpO2 99% Physical Exam  Nursing note and vitals reviewed. Constitutional: He is oriented to person, place, and time. He appears well-developed and well-nourished. No distress.  HENT:  Head: Normocephalic and atraumatic.  Eyes: EOM are normal. Pupils are equal, round, and reactive to light.  Neck: Neck supple.  Cardiovascular: Normal rate and regular rhythm.   Murmur heard. Pulmonary/Chest: Effort normal and breath sounds normal. He has no wheezes. He has no rales. He exhibits no tenderness.  Abdominal: Soft. Bowel sounds are normal.  There is no tenderness. There is no rebound and no guarding.  Musculoskeletal: Normal range of motion. He exhibits no edema.  Midline tenderness to L-spine.  Good ROM.  Moves all 4 extremities.  Neurological: He is alert and oriented to person, place, and time.  Skin: Skin is warm and dry. No rash noted. He is not diaphoretic.  Psychiatric: He has a normal mood and affect. His behavior is normal.    ED Course  Procedures (including critical care time) Labs Review Labs Reviewed  CBC - Abnormal; Notable for the following:    RBC 3.00 (*)    Hemoglobin 9.0 (*)    HCT 25.1 (*)    RDW 17.7 (*)    Platelets 432 (*)    All other components  within normal limits  COMPREHENSIVE METABOLIC PANEL - Abnormal; Notable for the following:    Potassium 3.6 (*)    BUN <3 (*)    Total Bilirubin 2.6 (*)    All other components within normal limits  RETICULOCYTES - Abnormal; Notable for the following:    Retic Ct Pct 9.8 (*)    RBC. 3.00 (*)    Retic Count, Manual 294.0 (*)    All other components within normal limits   Imaging Review Dg Lumbar Spine Complete  05/03/2013   CLINICAL DATA:  Sickle cell crisis, pain  EXAM: LUMBAR SPINE - COMPLETE 4+ VIEW  COMPARISON:  07/09/2012  FINDINGS: Scattered endplate concavities throughout lumbar vertebrae similar to previous exam consistent with sickle cell disease.  Overall vertebral body heights appear maintained.  No acute fracture, subluxation or bone destruction.  SI joints symmetric.  IMPRESSION: Osseous changes of sickle cell disease.  No definite acute bony findings.  Stable exam since 07/09/2012.   Electronically Signed   By: Lavonia Dana M.D.   On: 05/03/2013 20:24     EKG Interpretation   Date/Time:  Friday May 03 2013 17:32:35 EDT Ventricular Rate:  66 PR Interval:  137 QRS Duration: 107 QT Interval:  391 QTC Calculation: 410 R Axis:   75 Text Interpretation:  Sinus rhythm since last tracing no significant  change Confirmed by BELFI  MD, MELANIE ([id number]) on 05/03/2013 5:37:44 PM      MDM   Final diagnoses:  Low back pain  Sickle cell anemia with pain   Pt with a history of SCA, reports lower back pain.  Denies Acute chest syndrome symptoms, afebrile, HR normal, Oxygen saturation 100% RA, NAD, EKG without acute findings.  XR with chronic sickle cell changes Re-eval: Pt playing on phone resting comfortably in his bed. Pt reports 8/10 pain. Re-eval: Pt talking on phone resting comfortably in bed. Discussed lab results, imaging results, and treatment plan with the patient. Advised calling [facility] for "paperwork" to get established with Dr. Rodena Piety. Return precautions given. Reports  understanding and no other concerns at this time.  Patient is stable for discharge at this time.   Meds given in ED:  Medications  HYDROmorphone (DILAUDID) injection 2 mg (2 mg Intravenous Given 05/03/13 1809)  sodium chloride 0.9 % bolus 1,000 mL (0 mLs Intravenous Stopped 05/03/13 2012)  ondansetron (ZOFRAN) injection 4 mg (4 mg Intravenous Given 05/03/13 1804)  HYDROmorphone (DILAUDID) injection 2 mg (2 mg Intravenous Given 05/03/13 1855)  HYDROmorphone (DILAUDID) injection 1 mg (1 mg Intravenous Given 05/03/13 2031)  sodium chloride 0.9 % bolus 1,000 mL (1,000 mLs Intravenous New Bag/Given 05/03/13 2034)    Discharge Medication List as of 05/03/2013  8:45 PM  START taking these medications   Details  oxyCODONE-acetaminophen (PERCOCET/ROXICET) 5-325 MG per tablet Take 1-2 tablets by mouth every 4 (four) hours as needed for severe pain., Starting 05/03/2013, Until Discontinued, Print            Lorrine Kin, PA-C 05/05/13 1106

## 2013-05-03 NOTE — ED Notes (Signed)
Pt here from home with sickle cell crisis pain , pain is in his lower back , pt normally  takes percocet but he is out today , he is waiting to get in our sickle cell clinic

## 2013-05-03 NOTE — ED Notes (Signed)
Pt returned from xray

## 2013-05-03 NOTE — Discharge Instructions (Signed)
Call for a follow up appointment with Dr. Zigmund Daniel. Return to the Emergency Department if Symptoms worsen.   Take medication as prescribed.

## 2013-05-05 NOTE — ED Provider Notes (Signed)
Medical screening examination/treatment/procedure(s) were performed by non-physician practitioner and as supervising physician I was immediately available for consultation/collaboration.   EKG Interpretation   Date/Time:  Friday May 03 2013 17:32:35 EDT Ventricular Rate:  66 PR Interval:  137 QRS Duration: 107 QT Interval:  391 QTC Calculation: 410 R Axis:   75 Text Interpretation:  Sinus rhythm since last tracing no significant  change Confirmed by Christel Bai  MD, Aracelly Tencza ([id number]) on 05/03/2013 5:37:44 PM        Malvin Johns, MD 05/05/13 1537

## 2013-07-11 ENCOUNTER — Telehealth: Payer: Self-pay | Admitting: General Practice

## 2013-07-11 NOTE — Telephone Encounter (Signed)
Contacted patient to advise we are unable to accept him as a patient at this time.

## 2013-09-02 ENCOUNTER — Emergency Department (HOSPITAL_COMMUNITY)
Admission: EM | Admit: 2013-09-02 | Discharge: 2013-09-02 | Disposition: A | Discharge status: Home / Self Care | Payer: Medicaid Other | Attending: Emergency Medicine | Admitting: Emergency Medicine

## 2013-09-02 ENCOUNTER — Encounter (HOSPITAL_COMMUNITY): Payer: Self-pay | Admitting: Emergency Medicine

## 2013-09-02 DIAGNOSIS — M545 Low back pain, unspecified: Secondary | ICD-10-CM | POA: Diagnosis not present

## 2013-09-02 DIAGNOSIS — Z79899 Other long term (current) drug therapy: Secondary | ICD-10-CM | POA: Diagnosis not present

## 2013-09-02 DIAGNOSIS — D5700 Hb-SS disease with crisis, unspecified: Secondary | ICD-10-CM | POA: Insufficient documentation

## 2013-09-02 LAB — BASIC METABOLIC PANEL
Anion gap: 15 (ref 5–15)
BUN: 5 mg/dL — ABNORMAL LOW (ref 6–23)
CO2: 25 mEq/L (ref 19–32)
Calcium: 8.6 mg/dL (ref 8.4–10.5)
Chloride: 101 mEq/L (ref 96–112)
Creatinine, Ser: 0.54 mg/dL (ref 0.50–1.35)
GFR calc Af Amer: >90 mL/min (ref >90)
GFR calc non Af Amer: >90 mL/min (ref >90)
Glucose, Bld: 101 mg/dL — ABNORMAL HIGH (ref 70–99)
Potassium: 3.9 mEq/L (ref 3.7–5.3)
Sodium: 141 mEq/L (ref 137–147)

## 2013-09-02 LAB — RETICULOCYTES
RBC.: 3.82 MIL/uL — ABNORMAL LOW (ref 4.22–5.81)
Retic Count, Absolute: 202.5 10*3/uL — ABNORMAL HIGH (ref 19.0–186.0)
Retic Ct Pct: 5.3 % — ABNORMAL HIGH (ref 0.4–3.1)

## 2013-09-02 LAB — CBC
HCT: 31.8 % — ABNORMAL LOW (ref 39.0–52.0)
Hemoglobin: 11 g/dL — ABNORMAL LOW (ref 13.0–17.0)
MCH: 28.8 pg (ref 26.0–34.0)
MCHC: 34.6 g/dL (ref 30.0–36.0)
MCV: 83.2 fL (ref 78.0–100.0)
Platelets: 384 10*3/uL (ref 150–400)
RBC: 3.82 MIL/uL — ABNORMAL LOW (ref 4.22–5.81)
RDW: 19.8 % — ABNORMAL HIGH (ref 11.5–15.5)
WBC: 10 10*3/uL (ref 4.0–10.5)

## 2013-09-02 LAB — LACTATE DEHYDROGENASE: LDH: 286 U/L — ABNORMAL HIGH (ref 94–250)

## 2013-09-02 MED ORDER — HYDROMORPHONE HCL PF 1 MG/ML IJ SOLN
1.0000 mg | Freq: Once | INTRAMUSCULAR | Status: AC
  Administered 2013-09-02: 1 mg via INTRAVENOUS
  Filled 2013-09-02: qty 1

## 2013-09-02 MED ORDER — SODIUM CHLORIDE 0.9 % IV BOLUS (SEPSIS)
1000.0000 mL | Freq: Once | INTRAVENOUS | Status: AC
  Administered 2013-09-02: 1000 mL via INTRAVENOUS

## 2013-09-02 MED ORDER — OXYCODONE-ACETAMINOPHEN 5-325 MG PO TABS: 1.0000 | ORAL_TABLET | ORAL | Status: DC | PRN

## 2013-09-02 NOTE — Discharge Instructions (Signed)
Sickle Cell Anemia, Adult °Sickle cell anemia is a condition in which red blood cells have an abnormal "sickle" shape. This abnormal shape shortens the cells' life span, which results in a lower than normal concentration of red blood cells in the blood. The sickle shape also causes the cells to clump together and block free blood flow through the blood vessels. As a result, the tissues and organs of the body do not receive enough oxygen. Sickle cell anemia causes organ damage and pain and increases the risk of infection. °CAUSES  °Sickle cell anemia is a genetic disorder. Those who receive two copies of the gene have the condition, and those who receive one copy have the trait. °RISK FACTORS °The sickle cell gene is most common in people whose families originated in [place]. Other areas of the globe where sickle cell trait occurs include the Mediterranean, [place] and [place], the [place], and the [place].  °SIGNS AND SYMPTOMS °· Pain, especially in the extremities, back, chest, or abdomen (common). The pain may start suddenly or may develop following an illness, especially if there is dehydration. Pain can also occur due to overexertion or exposure to extreme temperature changes. °· Frequent severe bacterial infections, especially certain types of pneumonia and meningitis. °· Pain and swelling in the hands and feet. °· Decreased activity.   °· Loss of appetite.   °· Change in behavior. °· Headaches. °· Seizures. °· Shortness of breath or difficulty breathing. °· Vision changes. °· Skin ulcers. °Those with the trait may not have symptoms or they may have mild symptoms.  °DIAGNOSIS  °Sickle cell anemia is diagnosed with blood tests that demonstrate the genetic trait. It is often diagnosed during the newborn period, due to mandatory testing nationwide. A variety of blood tests, X-rays, CT scans, MRI scans, ultrasounds, and lung function tests may also be done to monitor the condition. °TREATMENT  °Sickle  cell anemia may be treated with: °· Medicines. You may be given pain medicines, antibiotic medicines (to treat and prevent infections) or medicines to increase the production of certain types of hemoglobin. °· Fluids. °· Oxygen. °· Blood transfusions. °HOME CARE INSTRUCTIONS  °· Drink enough fluid to keep your urine clear or pale yellow. Increase your fluid intake in hot weather and during exercise. °· Do not smoke. Smoking lowers oxygen levels in the blood.   °· Only take over-the-counter or prescription medicines for pain, fever, or discomfort as directed by your health care provider. °· Take antibiotics as directed by your health care provider. Make sure you finish them it even if you start to feel better.   °· Take supplements as directed by your health care provider.   °· Consider wearing a medical alert bracelet. This tells anyone caring for you in an emergency of your condition.   °· When traveling, keep your medical information, health care provider's names, and the medicines you take with you at all times.   °· If you develop a fever, do not take medicines to reduce the fever right away. This could cover up a problem that is developing. Notify your health care provider. °· Keep all follow-up appointments with your health care provider. Sickle cell anemia requires regular medical care. °SEEK MEDICAL CARE IF: ° You have a fever. °SEEK IMMEDIATE MEDICAL CARE IF:  °· You feel dizzy or faint.   °· You have new abdominal pain, especially on the left side near the stomach area.   °· You develop a persistent, often uncomfortable and painful penile erection (priapism). If this is not treated immediately it   will lead to impotence.   You have numbness your arms or legs or you have a hard time moving them.   You have a hard time with speech.   You have a fever or persistent symptoms for more than 2-3 days.   You have a fever and your symptoms suddenly get worse.   You have signs or symptoms of infection.  These include:   Chills.   Abnormal tiredness (lethargy).   Irritability.   Poor eating.   Vomiting.   You develop pain that is not helped with medicine.   You develop shortness of breath.  You have pain in your chest.   You are coughing up pus-like or bloody sputum.   You develop a stiff neck.  Your feet or hands swell or have pain.  Your abdomen appears bloated.  You develop joint pain. MAKE SURE YOU:  Understand these instructions.  Will watch your child's condition.  Will get help right away if your child is not doing well or gets worse. Document Released: 05/11/2005 Document Revised: 11/21/2012 Document Reviewed: 09/12/2012 [facility] Patient Information 2015 [place], [place]. This information is not intended to replace advice given to you by your health care provider. Make sure you discuss any questions you have with your health care provider.

## 2013-09-02 NOTE — ED Provider Notes (Signed)
CSN: [id number]     Arrival date & time 09/02/13  0534 History   First MD Initiated Contact with Patient 09/02/13 0544     Chief Complaint  Patient presents with  . Sickle Cell Pain Crisis  . Back Pain     (Consider location/radiation/quality/duration/timing/severity/associated sxs/prior Treatment) HPI  This is a young man with sickle cell anemia who presents with complaints of bilateral low back pain reminiscent of previous sickle cell crises.  Pain began about 3 hours prior to arrival. Patient took 200 mg of ibuprofen which did not do much to help with his pain. Pain is aching, 9/10 in severity. It is nonradiating. Patient denies fever. Nothing seems to make the pain worse or better. Patient denies genitourinary symptoms.  He has no history of acute chest syndrome. Fortunately, he is not experiencing chest pain or shortness of breath.  The patient is not followed by a hematologist. He takes no medications on a regular basis. He has received blood transfusions in the past. He is not at his baseline hemoglobin. He says he has been treated for sickle cell crises, 6 cassettes late at this [facility] for the past 2 years  Past Medical History  Diagnosis Date  . Sickle cell anemia    History reviewed. No pertinent past surgical history. No family history on file. History  Substance Use Topics  . Smoking status: Never Smoker   . Smokeless tobacco: Never Used  . Alcohol Use: No    Review of Systems  Ten point review of symptoms performed and is negative with the exception of symptoms noted above.   Allergies  Toradol; Tramadol; Vancomycin; and Morphine and related  Home Medications   Prior to Admission medications   Medication Sig Start Date End Date Taking? Authorizing Provider  folic acid (FOLVITE) 1 MG tablet Take 1 mg by mouth daily.   Yes Historical Provider, MD  HYDROcodone-acetaminophen (NORCO/VICODIN) 5-325 MG per tablet Take 1-2 tablets by mouth every 6 (six)  hours as needed for moderate pain.    Yes Historical Provider, MD  hydroxyurea (HYDREA) 500 MG capsule Take 1 capsule (500 mg total) by mouth daily. May take with food to minimize GI side effects. 01/25/13  Yes Marianne L York, PA-C  potassium chloride (K-DUR) 10 MEQ tablet Take 10 mEq by mouth 2 (two) times daily.   Yes Historical Provider, MD   BP 121/66  Pulse 71  Temp(Src) 98.1 F (36.7 C) (Oral)  Resp 12  Ht 6\' 2"  (1.88 m)  Wt 135 lb (61.236 kg)  BMI 17.33 kg/m2  SpO2 100% Physical Exam Gen: well developed and well nourished appearing Head: NCAT Eyes: PERL, EOMI Nose: no epistaixis or rhinorrhea Mouth/throat: mucosa is moist and pink Neck: supple, no stridor Lungs: CTA B, no wheezing, rhonchi or rales CV: RRR, no murmur, extremities appear well perfused.  Abd: soft, notender, nondistended Back: no flank ttp, no cva ttp, ttp diffusely and bilaterally in the lumbar region.  Skin: warm and dry Ext: normal to inspection, no dependent edema Neuro: CN ii-xii grossly intact, no focal deficits Psyche; normal affect,  calm and cooperative.   ED Course  Procedures (including critical care time) Labs Review  Results for orders placed during the hospital encounter of 09/02/13 (from the past 24 hour(s))  CBC     Status: Abnormal   Collection Time    09/02/13  6:00 AM      Result Value Ref Range   WBC 10.0  4.0 - 10.5 K/uL  RBC 3.82 (*) 4.22 - 5.81 MIL/uL   Hemoglobin 11.0 (*) 13.0 - 17.0 g/dL   HCT 31.8 (*) 39.0 - 52.0 %   MCV 83.2  78.0 - 100.0 fL   MCH 28.8  26.0 - 34.0 pg   MCHC 34.6  30.0 - 36.0 g/dL   RDW 19.8 (*) 11.5 - 15.5 %   Platelets 384  150 - 400 K/uL  BASIC METABOLIC PANEL     Status: Abnormal   Collection Time    09/02/13  6:00 AM      Result Value Ref Range   Sodium 141  137 - 147 mEq/L   Potassium 3.9  3.7 - 5.3 mEq/L   Chloride 101  96 - 112 mEq/L   CO2 25  19 - 32 mEq/L   Glucose, Bld 101 (*) 70 - 99 mg/dL   BUN 5 (*) 6 - 23 mg/dL   Creatinine, Ser  0.54  0.50 - 1.35 mg/dL   Calcium 8.6  8.4 - 10.5 mg/dL   GFR calc non Af Amer >90  >90 mL/min   GFR calc Af Amer >90  >90 mL/min   Anion gap 15  5 - 15  LACTATE DEHYDROGENASE     Status: Abnormal   Collection Time    09/02/13  6:00 AM      Result Value Ref Range   LDH 286 (*) 94 - 250 U/L  RETICULOCYTES     Status: Abnormal   Collection Time    09/02/13  6:00 AM      Result Value Ref Range   Retic Ct Pct 5.3 (*) 0.4 - 3.1 %   RBC. 3.82 (*) 4.22 - 5.81 MIL/uL   Retic Count, Manual 202.5 (*) 19.0 - 186.0 K/uL     MDM  Patient with sickle cell crisis. Hgb acceptable at 10 mg/dL.  We are treating with IVF, IV Toradol and IV dilaudid. I anticipate that the patient will be able to go home as he has shown improvement but, not complete relief of pain, after first dose of dilaudid and this appears to be an uncomplicated sickle cell pain crisis.    Elyn Peers, MD 09/02/13 [phone]

## 2013-09-02 NOTE — ED Notes (Signed)
Pt reports mid lower back pain that started around 0400 that woke him up, denies injury, and reports he has sickle cell disease and thinks the pain is from his sickle cell. A&OX4.

## 2013-10-06 ENCOUNTER — Emergency Department (HOSPITAL_COMMUNITY)
Admission: EM | Admit: 2013-10-06 | Discharge: 2013-10-06 | Disposition: A | Discharge status: Home / Self Care | Payer: Medicaid Other | Attending: Emergency Medicine | Admitting: Emergency Medicine

## 2013-10-06 ENCOUNTER — Encounter (HOSPITAL_COMMUNITY): Payer: Self-pay | Admitting: Emergency Medicine

## 2013-10-06 DIAGNOSIS — D5700 Hb-SS disease with crisis, unspecified: Secondary | ICD-10-CM

## 2013-10-06 DIAGNOSIS — Z79899 Other long term (current) drug therapy: Secondary | ICD-10-CM | POA: Insufficient documentation

## 2013-10-06 LAB — CBC WITH DIFFERENTIAL/PLATELET
Basophils Absolute: 0 10*3/uL (ref 0.0–0.1)
Basophils Relative: 0 % (ref 0–1)
Eosinophils Absolute: 0.3 10*3/uL (ref 0.0–0.7)
Eosinophils Relative: 3 % (ref 0–5)
HCT: 26.8 % — ABNORMAL LOW (ref 39.0–52.0)
Hemoglobin: 9.6 g/dL — ABNORMAL LOW (ref 13.0–17.0)
Lymphocytes Relative: 33 % (ref 12–46)
Lymphs Abs: 3.6 10*3/uL (ref 0.7–4.0)
MCH: 31 pg (ref 26.0–34.0)
MCHC: 35.8 g/dL (ref 30.0–36.0)
MCV: 86.5 fL (ref 78.0–100.0)
Monocytes Absolute: 0.9 10*3/uL (ref 0.1–1.0)
Monocytes Relative: 9 % (ref 3–12)
Neutro Abs: 6.1 10*3/uL (ref 1.7–7.7)
Neutrophils Relative %: 56 % (ref 43–77)
Platelets: 509 10*3/uL — ABNORMAL HIGH (ref 150–400)
RBC: 3.1 MIL/uL — ABNORMAL LOW (ref 4.22–5.81)
RDW: 18.7 % — ABNORMAL HIGH (ref 11.5–15.5)
WBC: 10.9 10*3/uL — ABNORMAL HIGH (ref 4.0–10.5)

## 2013-10-06 LAB — BASIC METABOLIC PANEL
Anion gap: 13 (ref 5–15)
BUN: 4 mg/dL — ABNORMAL LOW (ref 6–23)
CO2: 24 mEq/L (ref 19–32)
Calcium: 9 mg/dL (ref 8.4–10.5)
Chloride: 101 mEq/L (ref 96–112)
Creatinine, Ser: 0.58 mg/dL (ref 0.50–1.35)
GFR calc Af Amer: >90 mL/min (ref >90)
GFR calc non Af Amer: >90 mL/min (ref >90)
Glucose, Bld: 99 mg/dL (ref 70–99)
Potassium: 3.6 mEq/L — ABNORMAL LOW (ref 3.7–5.3)
Sodium: 138 mEq/L (ref 137–147)

## 2013-10-06 LAB — RETICULOCYTES
RBC.: 3.1 MIL/uL — ABNORMAL LOW (ref 4.22–5.81)
Retic Count, Absolute: 461.9 10*3/uL — ABNORMAL HIGH (ref 19.0–186.0)
Retic Ct Pct: 14.9 % — ABNORMAL HIGH (ref 0.4–3.1)

## 2013-10-06 MED ORDER — HYDROMORPHONE HCL PF 1 MG/ML IJ SOLN
1.0000 mg | Freq: Once | INTRAMUSCULAR | Status: AC
  Administered 2013-10-06: 1 mg via INTRAVENOUS
  Filled 2013-10-06: qty 1

## 2013-10-06 MED ORDER — PREDNISONE 10 MG PO TABS: 20.0000 mg | ORAL_TABLET | Freq: Every day | ORAL | Status: DC

## 2013-10-06 MED ORDER — OXYCODONE-ACETAMINOPHEN 5-325 MG PO TABS: 2.0000 | ORAL_TABLET | ORAL | Status: DC | PRN

## 2013-10-06 MED ORDER — ONDANSETRON HCL 4 MG/2ML IJ SOLN
4.0000 mg | Freq: Once | INTRAMUSCULAR | Status: AC
  Administered 2013-10-06: 4 mg via INTRAVENOUS
  Filled 2013-10-06: qty 2

## 2013-10-06 MED ORDER — METHOCARBAMOL 500 MG PO TABS: 500.0000 mg | ORAL_TABLET | Freq: Two times a day (BID) | ORAL | Status: DC

## 2013-10-06 MED ORDER — OXYCODONE-ACETAMINOPHEN 10-325 MG PO TABS: 1.0000 | ORAL_TABLET | ORAL | Status: DC | PRN

## 2013-10-06 NOTE — ED Notes (Signed)
Patient presents with c/o lower back pain from his sickle cell

## 2013-10-06 NOTE — Discharge Instructions (Signed)
Sickle Cell Anemia, Adult °Sickle cell anemia is a condition in which red blood cells have an abnormal "sickle" shape. This abnormal shape shortens the cells' life span, which results in a lower than normal concentration of red blood cells in the blood. The sickle shape also causes the cells to clump together and block free blood flow through the blood vessels. As a result, the tissues and organs of the body do not receive enough oxygen. Sickle cell anemia causes organ damage and pain and increases the risk of infection. °CAUSES  °Sickle cell anemia is a genetic disorder. Those who receive two copies of the gene have the condition, and those who receive one copy have the trait. °RISK FACTORS °The sickle cell gene is most common in people whose families originated in [place]. Other areas of the globe where sickle cell trait occurs include the [place], [place] and [place], the [place], and the [place].  °SIGNS AND SYMPTOMS °· Pain, especially in the extremities, back, chest, or abdomen (common). The pain may start suddenly or may develop following an illness, especially if there is dehydration. Pain can also occur due to overexertion or exposure to extreme temperature changes. °· Frequent severe bacterial infections, especially certain types of pneumonia and meningitis. °· Pain and swelling in the hands and feet. °· Decreased activity.   °· Loss of appetite.   °· Change in behavior. °· Headaches. °· Seizures. °· Shortness of breath or difficulty breathing. °· Vision changes. °· Skin ulcers. °Those with the trait may not have symptoms or they may have mild symptoms.  °DIAGNOSIS  °Sickle cell anemia is diagnosed with blood tests that demonstrate the genetic trait. It is often diagnosed during the newborn period, due to mandatory testing nationwide. A variety of blood tests, X-rays, CT scans, MRI scans, ultrasounds, and lung function tests may also be done to monitor the condition. °TREATMENT  °Sickle  cell anemia may be treated with: °· Medicines. You may be given pain medicines, antibiotic medicines (to treat and prevent infections) or medicines to increase the production of certain types of hemoglobin. °· Fluids. °· Oxygen. °· Blood transfusions. °HOME CARE INSTRUCTIONS  °· Drink enough fluid to keep your urine clear or pale yellow. Increase your fluid intake in hot weather and during exercise. °· Do not smoke. Smoking lowers oxygen levels in the blood.   °· Only take over-the-counter or prescription medicines for pain, fever, or discomfort as directed by your health care provider. °· Take antibiotics as directed by your health care provider. Make sure you finish them it even if you start to feel better.   °· Take supplements as directed by your health care provider.   °· Consider wearing a medical alert bracelet. This tells anyone caring for you in an emergency of your condition.   °· When traveling, keep your medical information, health care provider's names, and the medicines you take with you at all times.   °· If you develop a fever, do not take medicines to reduce the fever right away. This could cover up a problem that is developing. Notify your health care provider. °· Keep all follow-up appointments with your health care provider. Sickle cell anemia requires regular medical care. °SEEK MEDICAL CARE IF: ° You have a fever. °SEEK IMMEDIATE MEDICAL CARE IF:  °· You feel dizzy or faint.   °· You have new abdominal pain, especially on the left side near the stomach area.   °· You develop a persistent, often uncomfortable and painful penile erection (priapism). If this is not treated immediately it   will lead to impotence.   °· You have numbness your arms or legs or you have a hard time moving them.   °· You have a hard time with speech.   °· You have a fever or persistent symptoms for more than 2-3 days.   °· You have a fever and your symptoms suddenly get worse.   °· You have signs or symptoms of infection.  These include:   °¨ Chills.   °¨ Abnormal tiredness (lethargy).   °¨ Irritability.   °¨ Poor eating.   °¨ Vomiting.   °· You develop pain that is not helped with medicine.   °· You develop shortness of breath. °· You have pain in your chest.   °· You are coughing up pus-like or bloody sputum.   °· You develop a stiff neck. °· Your feet or hands swell or have pain. °· Your abdomen appears bloated. °· You develop joint pain. °MAKE SURE YOU: °· Understand these instructions. °Document Released: 05/11/2005 Document Revised: 06/17/2013 Document Reviewed: 09/12/2012 °Exit[employer]® Patient Information ©2015 [employer]. This information is not intended to replace advice given to you by your health care provider. Make sure you discuss any questions you have with your health care provider. ° °

## 2013-10-06 NOTE — ED Provider Notes (Signed)
CSN: [id number]     Arrival date & time 10/06/13  0401 History   First MD Initiated Contact with Patient 10/06/13 [phone]     Chief Complaint  Patient presents with  . Sickle Cell Pain Crisis     (Consider location/radiation/quality/duration/timing/severity/associated sxs/prior Treatment) Patient is a 23 y.o. male presenting with sickle cell pain. The history is provided by the patient. No language interpreter was used.  Sickle Cell Pain Crisis Location:  Lower extremity and back Onset quality:  Gradual Duration:  1 day Similar to previous crisis episodes: yes   Timing:  Constant Progression:  Worsening Chronicity:  New Sickle cell genotype:  Unable to specify History of pulmonary emboli: no   Context: not non-compliance   Relieved by:  Nothing Worsened by:  Nothing tried Ineffective treatments:  None tried Associated symptoms: no chest pain, no cough, no fever and no shortness of breath   Risk factors: frequent pain crises     Past Medical History  Diagnosis Date  . Sickle cell anemia    History reviewed. No pertinent past surgical history. History reviewed. No pertinent family history. History  Substance Use Topics  . Smoking status: Never Smoker   . Smokeless tobacco: Never Used  . Alcohol Use: No    Review of Systems  Constitutional: Negative for fever.  Respiratory: Negative for cough and shortness of breath.   Cardiovascular: Negative for chest pain.  All other systems reviewed and are negative.     Allergies  Toradol; Tramadol; Vancomycin; and Morphine and related  Home Medications   Prior to Admission medications   Medication Sig Start Date End Date Taking? Authorizing Provider  folic acid (FOLVITE) 1 MG tablet Take 1 mg by mouth daily.    Historical Provider, MD  HYDROcodone-acetaminophen (NORCO/VICODIN) 5-325 MG per tablet Take 1-2 tablets by mouth every 6 (six) hours as needed for moderate pain.     Historical Provider, MD  hydroxyurea (HYDREA) 500  MG capsule Take 1 capsule (500 mg total) by mouth daily. May take with food to minimize GI side effects. 01/25/13   Melton Alar, PA-C  oxyCODONE-acetaminophen (PERCOCET/ROXICET) 5-325 MG per tablet Take 1-2 tablets by mouth every 4 (four) hours as needed for moderate pain or severe pain. 09/02/13   Elyn Peers, MD  potassium chloride (K-DUR) 10 MEQ tablet Take 10 mEq by mouth 2 (two) times daily.    Historical Provider, MD   BP 123/68  Pulse 67  Temp(Src) 98.4 F (36.9 C) (Oral)  Resp 16  Ht 6\' 2"  (1.88 m)  Wt 135 lb (61.236 kg)  BMI 17.33 kg/m2  SpO2 98% Physical Exam  Nursing note and vitals reviewed. Constitutional: He is oriented to person, place, and time. He appears well-developed and well-nourished.  HENT:  Head: Normocephalic and atraumatic.  Right Ear: External ear normal.  Left Ear: External ear normal.  Nose: Nose normal.  Mouth/Throat: Oropharynx is clear and moist.  Eyes: EOM are normal. Pupils are equal, round, and reactive to light.  Neck: Normal range of motion.  Cardiovascular: Normal rate and normal heart sounds.   Pulmonary/Chest: Effort normal.  Abdominal: Soft. He exhibits no distension.  Musculoskeletal: Normal range of motion.  Neurological: He is alert and oriented to person, place, and time.  Skin: Skin is warm.  Psychiatric: He has a normal mood and affect.    ED Course  Procedures (including critical care time) Labs Review Labs Reviewed  CBC WITH DIFFERENTIAL  BASIC METABOLIC PANEL  RETICULOCYTES  Imaging Review No results found.   EKG Interpretation None      MDM  Pt reports temporary pain relief with pain medications,    Final diagnoses:  Sickle cell anemia with crisis    Pt given iv fluids,   Pt tolerating po fluids,   Pt reports pain improving with pain medication.  Pt given rx for percocet.   Referrals.       [place], PA-C 10/06/13 1231

## 2013-10-06 NOTE — ED Provider Notes (Signed)
Emergency Ultrasound Study:   Angiocath insertion Performed by: Serita Grit DAVID III  Consent: Verbal consent obtained. Risks and benefits: risks, benefits and alternatives were discussed Immediately prior to procedure the correct patient, procedure, equipment, support staff and site/side marked as needed.  Indication: difficult IV access Preparation: Patient was prepped and draped in the usual sterile fashion. Vein Location: right brachial vein was visualized during assessment for potential access sites and was found to be patent/ easily compressed with linear ultrasound.  The needle was visualized with real-time ultrasound and guided into the vein. Gauge: 20  Image saved and stored.  Normal blood return.  Patient tolerance: Patient tolerated the procedure well with no immediate complications.      Houston Siren III, MD 10/06/13 [phone]

## 2013-10-06 NOTE — ED Notes (Signed)
Pt reports increased back pain with sickle cell crisis since yesterday at 5pm. Pt was lying on his side playing texting on his cell phone when RN came in room to assess pt. RN had to ask pt to get off cell phone to answer questions. Pt states he does not have a PCP to manage his sickle cell disease.

## 2013-10-06 NOTE — ED Notes (Signed)
Attempted PIV start x 2 with first RN and x 3 with 2nd RN albeit success. Paged IV Team for PIV start

## 2013-10-07 NOTE — ED Provider Notes (Signed)
Medical screening examination/treatment/procedure(s) were conducted as a shared visit with non-physician practitioner(s) or resident and myself. I personally evaluated the patient during the encounter and agree with the findings.  I have personally reviewed any xrays and/ or EKG's with the provider and I agree with interpretation.  Patient history sickle cell anemia presents with lower back pain paraspinal, deep H., constant for 24 hours. Patient's had this multiple times with passage of some of his sickle pain crisis. Patient denies fevers, chills or systemic symptoms. Patient denies IV drug use, back surgery, leg weakness or numbness, urinary changes. On exam patient is paraspinal and sacral tenderness midline, 5+ strength in lower extremities bilateral flexion extension of hips knees and great toe, sensation intact lower extremities, well-appearing, abdomen soft nontender. Plan for pain control and close outpatient followup, similar to previous episodes.  Low back pain, Sickle pain crisis   Mariea Clonts, MD 10/07/13 [phone]

## 2014-02-07 ENCOUNTER — Encounter (HOSPITAL_COMMUNITY): Payer: Self-pay | Admitting: Family Medicine

## 2014-02-07 ENCOUNTER — Inpatient Hospital Stay (HOSPITAL_COMMUNITY)
Admission: EM | Admit: 2014-02-07 | Discharge: 2014-02-12 | DRG: 812 | Disposition: A | Discharge status: Home / Self Care | Payer: Medicaid Other | Attending: Internal Medicine | Admitting: Internal Medicine

## 2014-02-07 DIAGNOSIS — Z9049 Acquired absence of other specified parts of digestive tract: Secondary | ICD-10-CM | POA: Diagnosis present

## 2014-02-07 DIAGNOSIS — Z881 Allergy status to other antibiotic agents status: Secondary | ICD-10-CM

## 2014-02-07 DIAGNOSIS — D5700 Hb-SS disease with crisis, unspecified: Principal | ICD-10-CM

## 2014-02-07 DIAGNOSIS — Z885 Allergy status to narcotic agent status: Secondary | ICD-10-CM

## 2014-02-07 DIAGNOSIS — D72829 Elevated white blood cell count, unspecified: Secondary | ICD-10-CM | POA: Diagnosis present

## 2014-02-07 DIAGNOSIS — E876 Hypokalemia: Secondary | ICD-10-CM

## 2014-02-07 DIAGNOSIS — R17 Unspecified jaundice: Secondary | ICD-10-CM | POA: Diagnosis present

## 2014-02-07 LAB — COMPREHENSIVE METABOLIC PANEL
ALT: 10 U/L (ref 0–53)
AST: 29 U/L (ref 0–37)
Albumin: 4 g/dL (ref 3.5–5.2)
Alkaline Phosphatase: 75 U/L (ref 39–117)
Anion gap: 9 (ref 5–15)
BUN: 6 mg/dL (ref 6–23)
CO2: 24 mmol/L (ref 19–32)
Calcium: 9.1 mg/dL (ref 8.4–10.5)
Chloride: 106 mEq/L (ref 96–112)
Creatinine, Ser: 0.63 mg/dL (ref 0.50–1.35)
GFR calc Af Amer: >90 mL/min (ref >90)
GFR calc non Af Amer: >90 mL/min (ref >90)
Glucose, Bld: 115 mg/dL — ABNORMAL HIGH (ref 70–99)
Potassium: 3.4 mmol/L — ABNORMAL LOW (ref 3.5–5.1)
Sodium: 139 mmol/L (ref 135–145)
Total Bilirubin: 3.4 mg/dL — ABNORMAL HIGH (ref 0.3–1.2)
Total Protein: 7 g/dL (ref 6.0–8.3)

## 2014-02-07 LAB — CBC WITH DIFFERENTIAL/PLATELET
Basophils Absolute: 0 10*3/uL (ref 0.0–0.1)
Basophils Relative: 0 % (ref 0–1)
Eosinophils Absolute: 0.1 10*3/uL (ref 0.0–0.7)
Eosinophils Relative: 1 % (ref 0–5)
HCT: 27.2 % — ABNORMAL LOW (ref 39.0–52.0)
Hemoglobin: 9.4 g/dL — ABNORMAL LOW (ref 13.0–17.0)
Lymphocytes Relative: 11 % — ABNORMAL LOW (ref 12–46)
Lymphs Abs: 1.3 10*3/uL (ref 0.7–4.0)
MCH: 29.6 pg (ref 26.0–34.0)
MCHC: 34.6 g/dL (ref 30.0–36.0)
MCV: 85.5 fL (ref 78.0–100.0)
Monocytes Absolute: 1.2 10*3/uL — ABNORMAL HIGH (ref 0.1–1.0)
Monocytes Relative: 10 % (ref 3–12)
Neutro Abs: 8.9 10*3/uL — ABNORMAL HIGH (ref 1.7–7.7)
Neutrophils Relative %: 78 % — ABNORMAL HIGH (ref 43–77)
Platelets: 579 10*3/uL — ABNORMAL HIGH (ref 150–400)
RBC: 3.18 MIL/uL — ABNORMAL LOW (ref 4.22–5.81)
RDW: 19.7 % — ABNORMAL HIGH (ref 11.5–15.5)
WBC: 11.4 10*3/uL — ABNORMAL HIGH (ref 4.0–10.5)

## 2014-02-07 LAB — RETICULOCYTES
RBC.: 3.18 MIL/uL — ABNORMAL LOW (ref 4.22–5.81)
Retic Count, Absolute: 457.9 10*3/uL — ABNORMAL HIGH (ref 19.0–186.0)
Retic Ct Pct: 14.4 % — ABNORMAL HIGH (ref 0.4–3.1)

## 2014-02-07 MED ORDER — POLYETHYLENE GLYCOL 3350 17 G PO PACK
17.0000 g | PACK | Freq: Every day | ORAL | Status: DC | PRN
  Filled 2014-02-07: qty 1

## 2014-02-07 MED ORDER — HEPARIN SODIUM (PORCINE) 5000 UNIT/ML IJ SOLN
5000.0000 [IU] | Freq: Three times a day (TID) | INTRAMUSCULAR | Status: DC
  Administered 2014-02-07 – 2014-02-09 (×5): 5000 [IU] via SUBCUTANEOUS
  Filled 2014-02-07 (×15): qty 1

## 2014-02-07 MED ORDER — HYDROMORPHONE HCL 1 MG/ML IJ SOLN
2.0000 mg | Freq: Once | INTRAMUSCULAR | Status: AC
  Administered 2014-02-07: 2 mg via INTRAVENOUS
  Filled 2014-02-07: qty 2

## 2014-02-07 MED ORDER — POTASSIUM CHLORIDE CRYS ER 20 MEQ PO TBCR
40.0000 meq | EXTENDED_RELEASE_TABLET | Freq: Once | ORAL | Status: AC
Start: 2014-02-07 — End: 2014-02-07
  Administered 2014-02-07: 40 meq via ORAL
  Filled 2014-02-07: qty 2

## 2014-02-07 MED ORDER — HYDROMORPHONE HCL 1 MG/ML IJ SOLN
1.0000 mg | Freq: Once | INTRAMUSCULAR | Status: AC
  Administered 2014-02-07: 1 mg via INTRAVENOUS
  Filled 2014-02-07: qty 1

## 2014-02-07 MED ORDER — HYDROMORPHONE HCL 1 MG/ML IJ SOLN
2.0000 mg | INTRAMUSCULAR | Status: DC | PRN
Start: 2014-02-07 — End: 2014-02-08
  Administered 2014-02-07 – 2014-02-08 (×5): 2 mg via INTRAVENOUS
  Filled 2014-02-07 (×5): qty 2

## 2014-02-07 MED ORDER — DIPHENHYDRAMINE HCL 25 MG PO CAPS
25.0000 mg | ORAL_CAPSULE | Freq: Once | ORAL | Status: AC
  Administered 2014-02-07: 25 mg via ORAL
  Filled 2014-02-07: qty 1

## 2014-02-07 MED ORDER — DIPHENHYDRAMINE HCL 25 MG PO CAPS
25.0000 mg | ORAL_CAPSULE | Freq: Four times a day (QID) | ORAL | Status: DC | PRN
  Administered 2014-02-07: 25 mg via ORAL
  Filled 2014-02-07: qty 1

## 2014-02-07 MED ORDER — SODIUM CHLORIDE 0.45 % IV SOLN
INTRAVENOUS | Status: DC
  Administered 2014-02-07 (×2): via INTRAVENOUS
  Administered 2014-02-08: 1000 mL via INTRAVENOUS
  Administered 2014-02-08 – 2014-02-11 (×6): via INTRAVENOUS

## 2014-02-07 MED ORDER — HYDROXYUREA 500 MG PO CAPS
500.0000 mg | ORAL_CAPSULE | Freq: Two times a day (BID) | ORAL | Status: DC
  Administered 2014-02-07 – 2014-02-12 (×10): 500 mg via ORAL
  Filled 2014-02-07 (×11): qty 1

## 2014-02-07 MED ORDER — FOLIC ACID 1 MG PO TABS
1.0000 mg | ORAL_TABLET | Freq: Every day | ORAL | Status: DC
  Administered 2014-02-08 – 2014-02-12 (×5): 1 mg via ORAL
  Filled 2014-02-07 (×5): qty 1

## 2014-02-07 MED ORDER — SODIUM CHLORIDE 0.9 % IV BOLUS (SEPSIS)
1000.0000 mL | Freq: Once | INTRAVENOUS | Status: AC
  Administered 2014-02-07: 1000 mL via INTRAVENOUS

## 2014-02-07 MED ORDER — OXYCODONE-ACETAMINOPHEN 5-325 MG PO TABS
1.0000 | ORAL_TABLET | Freq: Four times a day (QID) | ORAL | Status: DC | PRN
Start: 2014-02-07 — End: 2014-02-10
  Administered 2014-02-07: 1 via ORAL
  Filled 2014-02-07: qty 1

## 2014-02-07 NOTE — ED Notes (Signed)
Attempted report x1. 

## 2014-02-07 NOTE — ED Notes (Signed)
Pt presents with c/o sickle cell pain to lower back and to the RLE from the knee to hip. Pt denies chest pain/shortness of breath.

## 2014-02-07 NOTE — ED Notes (Signed)
Pt reports to ed with complaint of right leg pain (hip to knee) and lower back pain x1 day. Hx sickle cell.

## 2014-02-07 NOTE — ED Provider Notes (Signed)
CSN: [id number]     Arrival date & time 02/07/14  4259 History   First MD Initiated Contact with Patient 02/07/14 [phone]     Chief Complaint  Patient presents with  . Sickle Cell Pain Crisis     (Consider location/radiation/quality/duration/timing/severity/associated sxs/prior Treatment) HPI Comments:  23 year old male with a past medical  History of sickle cell anemia presenting with sickle cell pain crisis in his lower back and right lower extremity beginning when he woke up this morning. He states this is typical for his sickle cell pain. No injury or trauma. He tried taking Tylenol and ibuprofen with no relief. Denies fever, chills, nausea, vomiting, chest pain or shortness of breath. Denies increased urinary frequency, urgency or dysuria. He does not have a PCP at this time.  Patient is a 23 y.o. male presenting with sickle cell pain. The history is provided by the patient.  Sickle Cell Pain Crisis   Past Medical History  Diagnosis Date  . Sickle cell anemia    Past Surgical History  Procedure Laterality Date  . Cholecystectomy     No family history on file. History  Substance Use Topics  . Smoking status: Never Smoker   . Smokeless tobacco: Never Used  . Alcohol Use: No    Review of Systems  10 Systems reviewed and are negative for acute change except as noted in the HPI.  Allergies  Morphine and related; Toradol; Tramadol; Vancomycin; and Zosyn  Home Medications   Prior to Admission medications   Medication Sig Start Date End Date Taking? Authorizing Provider  folic acid (FOLVITE) 1 MG tablet Take 1 mg by mouth daily.   Yes Historical Provider, MD  hydroxyurea (HYDREA) 500 MG capsule Take 500 mg by mouth 2 (two) times daily. May take with food to minimize GI side effects.   Yes Historical Provider, MD  methocarbamol (ROBAXIN) 500 MG tablet Take 1 tablet (500 mg total) by mouth 2 (two) times daily. 10/06/13  Yes [place], PA-C  oxyCODONE-acetaminophen  (PERCOCET) 10-325 MG per tablet Take 1 tablet by mouth every 4 (four) hours as needed for pain. 10/06/13  Yes Hollace Kinnier Sofia, PA-C  potassium chloride (K-DUR) 10 MEQ tablet Take 10 mEq by mouth daily.    Yes Historical Provider, MD  predniSONE (DELTASONE) 10 MG tablet Take 2 tablets (20 mg total) by mouth daily. 10/06/13  Yes Hollace Kinnier Sofia, PA-C  promethazine (PHENERGAN) 25 MG tablet Take 25 mg by mouth every 6 (six) hours as needed. For nausea 08/27/13  Yes Historical Provider, MD  oxyCODONE-acetaminophen (PERCOCET/ROXICET) 5-325 MG per tablet Take 2 tablets by mouth every 4 (four) hours as needed for severe pain. Patient not taking: Reported on 02/07/2014 10/06/13   Fransico Meadow, PA-C   BP 127/68 mmHg  Pulse 85  Temp(Src) 99.1 F (37.3 C) (Oral)  Resp 15  Ht 6\' 2"  (1.88 m)  Wt 135 lb (61.236 kg)  BMI 17.33 kg/m2  SpO2 98% Physical Exam  Constitutional: He is oriented to person, place, and time. He appears well-developed and well-nourished. No distress.  HENT:  Head: Normocephalic and atraumatic.  Mouth/Throat: Oropharynx is clear and moist.  Eyes: Conjunctivae are normal.  Neck: Normal range of motion. Neck supple. No spinous process tenderness and no muscular tenderness present.  Cardiovascular: Normal rate, regular rhythm and normal heart sounds.   Pulmonary/Chest: Effort normal and breath sounds normal. No respiratory distress.  Musculoskeletal: He exhibits no edema.  TTP right lower leg throughout. No swelling  or deformity. FROM knee and ankle. TTP across lower back. No edema or step-off.  Neurological: He is alert and oriented to person, place, and time. He has normal strength.  Strength lower extremities 5/5 and equal bilateral. Sensation intact. Normal gait.  Skin: Skin is warm and dry. No rash noted. He is not diaphoretic.  Psychiatric: He has a normal mood and affect. His behavior is normal.  Nursing note and vitals reviewed.   ED Course  Procedures (including critical  care time) Labs Review Labs Reviewed  COMPREHENSIVE METABOLIC PANEL - Abnormal; Notable for the following:    Potassium 3.4 (*)    Glucose, Bld 115 (*)    Total Bilirubin 3.4 (*)    All other components within normal limits  RETICULOCYTES - Abnormal; Notable for the following:    Retic Ct Pct 14.4 (*)    RBC. 3.18 (*)    Retic Count, Manual 457.9 (*)    All other components within normal limits  CBC WITH DIFFERENTIAL - Abnormal; Notable for the following:    WBC 11.4 (*)    RBC 3.18 (*)    Hemoglobin 9.4 (*)    HCT 27.2 (*)    RDW 19.7 (*)    Platelets 579 (*)    Neutrophils Relative % 78 (*)    Neutro Abs 8.9 (*)    Lymphocytes Relative 11 (*)    Monocytes Absolute 1.2 (*)    All other components within normal limits    Imaging Review No results found.   EKG Interpretation None      MDM   Final diagnoses:  Sickle cell anemia with pain   Patient presenting with sickle cell pain crisis. He is nontoxic appearing and in no apparent distress. Afebrile, vital signs stable. This is his typical sickle cell pain. No concern for acute chest syndrome. Labs at baseline. Pain uncontrolled after a total of 6 mg Dilaudid, a she will be admitted for observation and pain control. Admission accepted by internal medicine teaching service.  Discussed with attending Dr. Aline Brochure who agrees with plan of care.    Carman Ching, PA-C 02/07/14 [place], MD 02/07/14 [phone]

## 2014-02-07 NOTE — H&P (Signed)
Date: 02/07/2014               Patient Name:  Timothy Marks MRN: [id number]  DOB: 10/16/1990 Age / Sex: 23 y.o., male   PCP: No Pcp Per Patient         Medical Service: Internal Medicine Teaching Service         Attending Physician: Dr. Aldine Contes, MD    First Contact: Dr. Raelene Bott Pager: [phone]  Second Contact: Dr. Redmond Pulling Pager: [phone]       After Hours (After 5p/  First Contact Pager: [phone]  weekends / holidays): Second Contact Pager: [phone]   Chief Complaint: Back and right leg pain  History of Present Illness:   Patient is a 23 year old gentleman with a history of sickle cell anemia who presents with symptoms of back pain and right leg pain that is consistent with his previous sickle cell crises. Patient states that his symptoms started last night. He is complaining of 9 out of 10 severity back pain and right leg pain that starts in his hip and goes down to his knee into his feet. Patient states that usually sees a doctor in [facility] and has been taking all of his medications at home including hydroxyurea, folate, and Percocet. Patient states that he has had sickle cell crises in the past that have included symptoms consistent with acute chest including chest pain though he does not remember what therapy he received at an outside hospital for this. Patient otherwise currently denies any fevers, chills, chest pain, dyspnea, vomiting, nausea, abdominal pain, constipation, diarrhea, dysuria, or hematuria.  Patient is status post 5 mg Dilaudid and 1 L of normal saline bolus in the emergency department.  Meds: Current Facility-Administered Medications  Medication Dose Route Frequency Provider Last Rate Last Dose  . HYDROmorphone (DILAUDID) injection 2 mg  2 mg Intravenous Q3H PRN Francesca Oman, DO   2 mg at 02/07/14 1351  . oxyCODONE-acetaminophen (PERCOCET/ROXICET) 5-325 MG per tablet 1 tablet  1 tablet Oral Q6H PRN Francesca Oman, DO       Current Outpatient Prescriptions   Medication Sig Dispense Refill  . folic acid (FOLVITE) 1 MG tablet Take 1 mg by mouth daily.    . hydroxyurea (HYDREA) 500 MG capsule Take 500 mg by mouth 2 (two) times daily. May take with food to minimize GI side effects.    . methocarbamol (ROBAXIN) 500 MG tablet Take 1 tablet (500 mg total) by mouth 2 (two) times daily. 20 tablet 0  . oxyCODONE-acetaminophen (PERCOCET) 10-325 MG per tablet Take 1 tablet by mouth every 4 (four) hours as needed for pain. 20 tablet 0  . potassium chloride (K-DUR) 10 MEQ tablet Take 10 mEq by mouth daily.     . predniSONE (DELTASONE) 10 MG tablet Take 2 tablets (20 mg total) by mouth daily. 15 tablet 0  . promethazine (PHENERGAN) 25 MG tablet Take 25 mg by mouth every 6 (six) hours as needed. For nausea    . oxyCODONE-acetaminophen (PERCOCET/ROXICET) 5-325 MG per tablet Take 2 tablets by mouth every 4 (four) hours as needed for severe pain. (Patient not taking: Reported on 02/07/2014) 15 tablet 0    Past Medical History  Diagnosis Date  . Sickle cell anemia    Past Surgical History  Procedure Laterality Date  . Cholecystectomy      Allergies: Allergies as of 02/07/2014 - Review Complete 02/07/2014  Allergen Reaction Noted  . Morphine and related Hives 09/02/2013  . Toradol [  ketorolac tromethamine] Itching 01/24/2013  . Tramadol Hives 01/24/2013  . Vancomycin Itching 04/24/2013  . Zosyn [piperacillin sod-tazobactam so] Itching and Rash 10/06/2013   No family history on file. History   Social History  . Marital Status: Single    Spouse Name: N/A    Number of Children: N/A  . Years of Education: N/A   Occupational History  . Not on file.   Social History Main Topics  . Smoking status: Never Smoker   . Smokeless tobacco: Never Used  . Alcohol Use: No  . Drug Use: No  . Sexual Activity: Not on file   Other Topics Concern  . Not on file   Social History Narrative    Review of Systems: All pertinent ROS as stated in HPI.   Physical  Exam: Blood pressure 119/61, pulse 74, temperature 99.1 F (37.3 C), temperature source Oral, resp. rate 14, height 6\' 2"  (1.88 m), weight 61.236 kg (135 lb), SpO2 96 %. General: resting in bed HEENT: PERRL, EOMI, scleral icterus Cardiac: RRR, no rubs, murmurs or gallops Pulm: clear to auscultation bilaterally, moving normal volumes of air Abd: soft, nontender, nondistended, BS present Ext: warm and well perfused, no pedal edema, exquisite tenderness diffusely throughout right leg and hip Neuro: alert and oriented X3, cranial nerves II-XII grossly intact, 5 out of 5 strength in all 4 extremities Skin: no rashes or lesions noted Psych: appropriate affect  Lab results: Basic Metabolic Panel:  Recent Labs  02/07/14 0919  NA 139  K 3.4*  CL 106  CO2 24  GLUCOSE 115*  BUN 6  CREATININE 0.63  CALCIUM 9.1   Liver Function Tests:  Recent Labs  02/07/14 0919  AST 29  ALT 10  ALKPHOS 75  BILITOT 3.4*  PROT 7.0  ALBUMIN 4.0   No results for input(s): LIPASE, AMYLASE in the last 72 hours. No results for input(s): AMMONIA in the last 72 hours. CBC:  Recent Labs  02/07/14 0919  WBC 11.4*  NEUTROABS 8.9*  HGB 9.4*  HCT 27.2*  MCV 85.5  PLT 579*   Cardiac Enzymes: No results for input(s): CKTOTAL, CKMB, CKMBINDEX, TROPONINI in the last 72 hours. BNP: No results for input(s): PROBNP in the last 72 hours. D-Dimer: No results for input(s): DDIMER in the last 72 hours. CBG: No results for input(s): GLUCAP in the last 72 hours. Hemoglobin A1C: No results for input(s): HGBA1C in the last 72 hours. Fasting Lipid Panel: No results for input(s): CHOL, HDL, LDLCALC, TRIG, CHOLHDL, LDLDIRECT in the last 72 hours. Thyroid Function Tests: No results for input(s): TSH, T4TOTAL, FREET4, T3FREE, THYROIDAB in the last 72 hours. Anemia Panel:  Recent Labs  02/07/14 0919  RETICCTPCT 14.4*   Coagulation: No results for input(s): LABPROT, INR in the last 72 hours. Urine Drug  Screen: Drugs of Abuse     Component Value Date/Time   LABOPIA NONE DETECTED 03/03/2013 1857   COCAINSCRNUR NONE DETECTED 03/03/2013 1857   LABBENZ NONE DETECTED 03/03/2013 1857   AMPHETMU NONE DETECTED 03/03/2013 1857   THCU POSITIVE* 03/03/2013 1857   LABBARB NONE DETECTED 03/03/2013 1857    Alcohol Level: No results for input(s): ETH in the last 72 hours. Urinalysis: No results for input(s): COLORURINE, LABSPEC, PHURINE, GLUCOSEU, HGBUR, BILIRUBINUR, KETONESUR, PROTEINUR, UROBILINOGEN, NITRITE, LEUKOCYTESUR in the last 72 hours.  Invalid input(s): APPERANCEUR  Imaging results:  No results found.  Assessment & Plan by Problem: Active Problems:   Sickle cell crisis  Patient is a 23 year old gentleman with  a history of sickle cell presents with a sickle cell pain crisis.  Sickle cell crisis: Patient is prescribed hydroxyurea, Percocet at home. Patient exhibiting pain in back and right leg. Patient is status post 5 mg of Dilaudid and 1 L normal saline in the emergency department. Hemoglobin close to baseline of 9.4 (9.6 in August 2015). Reticulocytes elevated. Bilirubin elevated at 3.4. No indications for transfusions at this time. No signs of infection, afebrile. Leukocytosis of 11.4, likely reactive rather than primary infectious etiology. Patient satting well on room air. No significant chest pain. -Dilaudid 2 mg every 3 hours -Percocet 5-3 25 mg every 6 hours when necessary -Half-normal saline at 125 mL an hour -Continue hydroxyurea 500 mg twice a day, folic acid 1 mg daily -Chest x-ray -Titrate oxygen to above 95% -Continue home folate 1 mg by mouth daily -MiraLAX  Labs: Daily CBC  Diet: Regular diet Prophylaxis: Heparin subcutaneous Code: Full  Dispo: Disposition is deferred at this time, awaiting improvement of current medical problems. Anticipated discharge in approximately 2 day(s).   The patient does not have a current PCP (No Pcp Per Patient) and does need an  [facility] hospital follow-up appointment after discharge.  The patient does not have transportation limitations that hinder transportation to clinic appointments.  Signed: Luan Moore, M.D., Ph.D. Internal Medicine Teaching Service, PGY-1 02/07/2014, 2:18 PM

## 2014-02-08 ENCOUNTER — Observation Stay (HOSPITAL_COMMUNITY): Payer: Medicaid Other

## 2014-02-08 DIAGNOSIS — M549 Dorsalgia, unspecified: Secondary | ICD-10-CM | POA: Diagnosis present

## 2014-02-08 DIAGNOSIS — R17 Unspecified jaundice: Secondary | ICD-10-CM | POA: Diagnosis present

## 2014-02-08 DIAGNOSIS — D5700 Hb-SS disease with crisis, unspecified: Secondary | ICD-10-CM | POA: Diagnosis not present

## 2014-02-08 DIAGNOSIS — Z881 Allergy status to other antibiotic agents status: Secondary | ICD-10-CM | POA: Diagnosis not present

## 2014-02-08 DIAGNOSIS — Z9049 Acquired absence of other specified parts of digestive tract: Secondary | ICD-10-CM | POA: Diagnosis present

## 2014-02-08 DIAGNOSIS — Z885 Allergy status to narcotic agent status: Secondary | ICD-10-CM | POA: Diagnosis not present

## 2014-02-08 DIAGNOSIS — D72829 Elevated white blood cell count, unspecified: Secondary | ICD-10-CM | POA: Diagnosis present

## 2014-02-08 DIAGNOSIS — E876 Hypokalemia: Secondary | ICD-10-CM | POA: Diagnosis present

## 2014-02-08 LAB — BASIC METABOLIC PANEL
Anion gap: 7 (ref 5–15)
BUN: <5 mg/dL — ABNORMAL LOW (ref 6–23)
CO2: 24 mmol/L (ref 19–32)
Calcium: 8.6 mg/dL (ref 8.4–10.5)
Chloride: 106 mEq/L (ref 96–112)
Creatinine, Ser: 0.6 mg/dL (ref 0.50–1.35)
GFR calc Af Amer: >90 mL/min (ref >90)
GFR calc non Af Amer: >90 mL/min (ref >90)
Glucose, Bld: 92 mg/dL (ref 70–99)
Potassium: 3.4 mmol/L — ABNORMAL LOW (ref 3.5–5.1)
Sodium: 137 mmol/L (ref 135–145)

## 2014-02-08 LAB — CBC
HCT: 28.1 % — ABNORMAL LOW (ref 39.0–52.0)
Hemoglobin: 9.4 g/dL — ABNORMAL LOW (ref 13.0–17.0)
MCH: 28.7 pg (ref 26.0–34.0)
MCHC: 33.5 g/dL (ref 30.0–36.0)
MCV: 85.9 fL (ref 78.0–100.0)
Platelets: 501 10*3/uL — ABNORMAL HIGH (ref 150–400)
RBC: 3.27 MIL/uL — ABNORMAL LOW (ref 4.22–5.81)
RDW: 19.9 % — ABNORMAL HIGH (ref 11.5–15.5)
WBC: 11.7 10*3/uL — ABNORMAL HIGH (ref 4.0–10.5)

## 2014-02-08 MED ORDER — NALOXONE HCL 0.4 MG/ML IJ SOLN: 0.4000 mg | INTRAMUSCULAR | Status: DC | PRN

## 2014-02-08 MED ORDER — HYDROMORPHONE HCL 1 MG/ML IJ SOLN
2.0000 mg | INTRAMUSCULAR | Status: DC | PRN
  Administered 2014-02-08 (×3): 2 mg via INTRAVENOUS
  Filled 2014-02-08 (×3): qty 2

## 2014-02-08 MED ORDER — HYDROCERIN EX CREA
TOPICAL_CREAM | Freq: Two times a day (BID) | CUTANEOUS | Status: DC | PRN
  Administered 2014-02-08: 02:00:00 via TOPICAL
  Filled 2014-02-08: qty 113

## 2014-02-08 MED ORDER — ONDANSETRON HCL 4 MG/2ML IJ SOLN: 4.0000 mg | Freq: Four times a day (QID) | INTRAMUSCULAR | Status: DC | PRN

## 2014-02-08 MED ORDER — HYDROMORPHONE 0.3 MG/ML IV SOLN
INTRAVENOUS | Status: DC
  Administered 2014-02-08: 0.75 mg via INTRAVENOUS
  Administered 2014-02-08: 1.24 mg via INTRAVENOUS
  Administered 2014-02-08: 13:00:00 via INTRAVENOUS
  Administered 2014-02-09: 1.49 mg via INTRAVENOUS
  Administered 2014-02-09: 1 mg via INTRAVENOUS
  Administered 2014-02-09: 2.24 mg via INTRAVENOUS
  Administered 2014-02-09: 08:00:00 via INTRAVENOUS
  Administered 2014-02-09: 1.99 mg via INTRAVENOUS
  Administered 2014-02-09: 2.24 mg via INTRAVENOUS
  Administered 2014-02-09: 0.75 mg via INTRAVENOUS
  Administered 2014-02-10: 0.5 mg via INTRAVENOUS
  Administered 2014-02-10: 01:00:00 via INTRAVENOUS
  Filled 2014-02-08 (×3): qty 25

## 2014-02-08 MED ORDER — SODIUM CHLORIDE 0.9 % IJ SOLN: 9.0000 mL | INTRAMUSCULAR | Status: DC | PRN

## 2014-02-08 MED ORDER — DIPHENHYDRAMINE HCL 12.5 MG/5ML PO ELIX
12.5000 mg | ORAL_SOLUTION | Freq: Four times a day (QID) | ORAL | Status: DC | PRN
  Filled 2014-02-08: qty 5

## 2014-02-08 MED ORDER — DIPHENHYDRAMINE HCL 50 MG/ML IJ SOLN: 12.5000 mg | Freq: Four times a day (QID) | INTRAMUSCULAR | Status: DC | PRN

## 2014-02-08 MED ORDER — POTASSIUM CHLORIDE CRYS ER 20 MEQ PO TBCR
40.0000 meq | EXTENDED_RELEASE_TABLET | Freq: Once | ORAL | Status: AC
  Administered 2014-02-08: 40 meq via ORAL
  Filled 2014-02-08: qty 2

## 2014-02-08 NOTE — Progress Notes (Signed)
UR completed 

## 2014-02-08 NOTE — Progress Notes (Addendum)
Pt. C/O that PCA is not giving him relief, wanted a shot of pain medicine before getting hooked up to PCA.  Also stating he couldn't tolerate the thing on his nose to check his EtCO2.  Paged MD on call, will await for return and continue to monitor.  Alphonzo Lemmings, RN

## 2014-02-08 NOTE — Plan of Care (Signed)
Problem: Phase I Progression Outcomes Goal: Initial discharge plan identified Outcome: Completed/Met Date Met:  02/08/14 To return home with mother

## 2014-02-08 NOTE — Progress Notes (Signed)
Pt seen and examined with Dr. Raelene Bott. Case d/w residents in detail. I agree with findings and plan as outlined in Dr. Trudee Kuster note  Pt with severe pain in R leg especially hip   Physical Exam: Gen: AAO*3, in pain CVS: RRR, normal heart sounds Pulm: CTA b/l Abd: soft, non tender, BS + Ext: no edema, R LE is exquisitely tender to palpation esp R hip  Assessment and Plan: 23 y/o male with an acute sickle cell pain crisis  Sickle Cell Crisis: - Pt with elevated retic count and elevated bilirubin consistent with sickle cell crisis. - Started PCA dilaudid with 15 min lockout - Increase IVF to 150 cc/hr - c/w hydrea and folic acid - c/w O2 prn - hemoglobin stable - will check hip x ray to r/o avascular necrosis

## 2014-02-08 NOTE — Progress Notes (Signed)
Subjective: Remains in pain this morning, worse in the right leg Objective: Vital signs in last 24 hours: Filed Vitals:   02/07/14 1447 02/07/14 2216 02/08/14 0208 02/08/14 0648  BP: 128/85 126/77 123/72 118/54  Pulse: 86 65 66 66  Temp: 98.3 F (36.8 C) 99 F (37.2 C) 98.7 F (37.1 C) 97.4 F (36.3 C)  TempSrc: Oral Oral Oral Oral  Resp: 18 16 16 18   Height:      Weight:      SpO2: 98% 96% 96% 94%   Weight change:   Intake/Output Summary (Last 24 hours) at 02/08/14 0918 Last data filed at 02/08/14 0600  Gross per 24 hour  Intake 2189.17 ml  Output   1650 ml  Net 539.17 ml   General: resting in bed, in some distress from pain HEENT: PERRL, EOMI, scleral icterus Cardiac: RRR, no rubs, murmurs or gallops Pulm: clear to auscultation bilaterally, moving normal volumes of air Abd: soft, nontender, nondistended, BS present Ext: warm and well perfused, no pedal edema, exquisite tenderness diffusely throughout right leg and hip Neuro: alert and oriented X3, cranial nerves II-XII grossly intact, 5 out of 5 strength in all 4 extremities Skin: no rashes or lesions noted Psych: appropriate affect  Lab Results: Basic Metabolic Panel:  Recent Labs Lab 02/07/14 0919 02/08/14 0718  NA 139 137  K 3.4* 3.4*  CL 106 106  CO2 24 24  GLUCOSE 115* 92  BUN 6 <5*  CREATININE 0.63 0.60  CALCIUM 9.1 8.6   Liver Function Tests:  Recent Labs Lab 02/07/14 0919  AST 29  ALT 10  ALKPHOS 75  BILITOT 3.4*  PROT 7.0  ALBUMIN 4.0   No results for input(s): LIPASE, AMYLASE in the last 168 hours. No results for input(s): AMMONIA in the last 168 hours. CBC:  Recent Labs Lab 02/07/14 0919 02/08/14 0718  WBC 11.4* 11.7*  NEUTROABS 8.9*  --   HGB 9.4* 9.4*  HCT 27.2* 28.1*  MCV 85.5 85.9  PLT 579* 501*   Cardiac Enzymes: No results for input(s): CKTOTAL, CKMB, CKMBINDEX, TROPONINI in the last 168 hours. BNP: No results for input(s): PROBNP in the last 168  hours. D-Dimer: No results for input(s): DDIMER in the last 168 hours. CBG: No results for input(s): GLUCAP in the last 168 hours. Hemoglobin A1C: No results for input(s): HGBA1C in the last 168 hours. Fasting Lipid Panel: No results for input(s): CHOL, HDL, LDLCALC, TRIG, CHOLHDL, LDLDIRECT in the last 168 hours. Thyroid Function Tests: No results for input(s): TSH, T4TOTAL, FREET4, T3FREE, THYROIDAB in the last 168 hours. Coagulation: No results for input(s): LABPROT, INR in the last 168 hours. Anemia Panel:  Recent Labs Lab 02/07/14 0919  RETICCTPCT 14.4*   Urine Drug Screen: Drugs of Abuse     Component Value Date/Time   LABOPIA NONE DETECTED 03/03/2013 1857   COCAINSCRNUR NONE DETECTED 03/03/2013 1857   LABBENZ NONE DETECTED 03/03/2013 1857   AMPHETMU NONE DETECTED 03/03/2013 1857   THCU POSITIVE* 03/03/2013 1857   LABBARB NONE DETECTED 03/03/2013 1857    Alcohol Level: No results for input(s): ETH in the last 168 hours. Urinalysis: No results for input(s): COLORURINE, LABSPEC, PHURINE, GLUCOSEU, HGBUR, BILIRUBINUR, KETONESUR, PROTEINUR, UROBILINOGEN, NITRITE, LEUKOCYTESUR in the last 168 hours.  Invalid input(s): APPERANCEUR  Micro Results: No results found for this or any previous visit (from the past 240 hour(s)). Studies/Results: No results found. Medications: I have reviewed the patient's current medications. Scheduled Meds: . folic acid  1 mg  Oral Daily  . heparin  5,000 Units Subcutaneous 3 times per day  . hydroxyurea  500 mg Oral BID   Continuous Infusions: . sodium chloride 125 mL/hr at 02/08/14 0727   PRN Meds:.diphenhydrAMINE, hydrocerin, HYDROmorphone (DILAUDID) injection, oxyCODONE-acetaminophen, polyethylene glycol Assessment/Plan: Principal Problem:   Sickle cell crisis Active Problems:   Hypokalemia  Sickle cell crisis: Patient's pain has not been optimally controlled since admission with Dilaudid 2 mg every 3 hours and then Dilaudid 2  mg every 2 hours. Hemoglobin has remained stable at 9.4 with no indication for transfusion at this time. Patient continues to be afebrile, though with a likely reactive leukocytosis of 11.7. No reports of chest pain and patient is satting well on room air. -Increase half-normal saline to 150 mL/h -Start PCA Dilaudid with 0.25 mg bolus with 15 minute lockout -Percocet 5-3 25 mg every 6 hours when necessary -Continue home hydroxyurea 500 mg twice a day and folic acid 1 mg daily -MiraLAX -Hip x-ray to further evaluate for avascular necrosis  Labs: Daily CBC  Diet: Regular diet Prophylaxis: Heparin subcutaneous Code: Full  Dispo: Disposition is deferred at this time, awaiting improvement of current medical problems.  Anticipated discharge in approximately 2 day(s).   The patient does not have a current PCP (No Pcp Per Patient) and does need an [facility] hospital follow-up appointment after discharge.  The patient does not have transportation limitations that hinder transportation to clinic appointments.  .Services Needed at time of discharge: Y = Yes, Blank = No PT:   OT:   RN:   Equipment:   Other:     LOS: 1 day   Luan Moore, MD, PhD 02/08/2014, 9:18 AM

## 2014-02-09 LAB — BASIC METABOLIC PANEL
Anion gap: 8 (ref 5–15)
BUN: <5 mg/dL — ABNORMAL LOW (ref 6–23)
CO2: 26 mmol/L (ref 19–32)
Calcium: 8.9 mg/dL (ref 8.4–10.5)
Chloride: 104 mEq/L (ref 96–112)
Creatinine, Ser: 0.49 mg/dL — ABNORMAL LOW (ref 0.50–1.35)
GFR calc Af Amer: >90 mL/min (ref >90)
GFR calc non Af Amer: >90 mL/min (ref >90)
Glucose, Bld: 88 mg/dL (ref 70–99)
Potassium: 3.5 mmol/L (ref 3.5–5.1)
Sodium: 138 mmol/L (ref 135–145)

## 2014-02-09 LAB — CBC
HCT: 27.2 % — ABNORMAL LOW (ref 39.0–52.0)
Hemoglobin: 9.3 g/dL — ABNORMAL LOW (ref 13.0–17.0)
MCH: 29.4 pg (ref 26.0–34.0)
MCHC: 34.2 g/dL (ref 30.0–36.0)
MCV: 86.1 fL (ref 78.0–100.0)
Platelets: 536 10*3/uL — ABNORMAL HIGH (ref 150–400)
RBC: 3.16 MIL/uL — ABNORMAL LOW (ref 4.22–5.81)
RDW: 19.6 % — ABNORMAL HIGH (ref 11.5–15.5)
WBC: 10.5 10*3/uL (ref 4.0–10.5)

## 2014-02-09 MED ORDER — DOCUSATE SODIUM 100 MG PO CAPS
100.0000 mg | ORAL_CAPSULE | Freq: Two times a day (BID) | ORAL | Status: DC
  Administered 2014-02-09 – 2014-02-12 (×6): 100 mg via ORAL
  Filled 2014-02-09 (×6): qty 1

## 2014-02-09 NOTE — Progress Notes (Signed)
Subjective:  Patient remains in moderate pain this morning and is somnolent. No other complaints this morning.   Objective: Vital signs in last 24 hours: Filed Vitals:   02/09/14 0543 02/09/14 0738 02/09/14 0812 02/09/14 0815  BP: 117/65  112/60   Pulse: 65  61   Temp: 99.4 F (37.4 C)  99.2 F (37.3 C)   TempSrc: Oral  Oral   Resp: 14 20 18 18   Height:      Weight:      SpO2: 96%  97%    Weight change:   Intake/Output Summary (Last 24 hours) at 02/09/14 0817 Last data filed at 02/09/14 0540  Gross per 24 hour  Intake 1895.83 ml  Output   4575 ml  Net -2679.17 ml   General: resting in bed, somnolent HEENT: PERRL, EOMI, scleral icterus, improved over the interval Cardiac: RRR, no rubs, murmurs or gallops Pulm: clear to auscultation bilaterally, moving normal volumes of air Abd: soft, nontender, nondistended, BS present Ext: warm and well perfused, no pedal edema, exquisite tenderness diffusely throughout right leg and hip Neuro: alert and oriented X3, cranial nerves II-XII grossly intact, 5 out of 5 strength in all 4 extremities Skin: no rashes or lesions noted Psych: appropriate affect  Lab Results: Basic Metabolic Panel:  Recent Labs Lab 02/07/14 0919 02/08/14 0718  NA 139 137  K 3.4* 3.4*  CL 106 106  CO2 24 24  GLUCOSE 115* 92  BUN 6 <5*  CREATININE 0.63 0.60  CALCIUM 9.1 8.6   Liver Function Tests:  Recent Labs Lab 02/07/14 0919  AST 29  ALT 10  ALKPHOS 75  BILITOT 3.4*  PROT 7.0  ALBUMIN 4.0   No results for input(s): LIPASE, AMYLASE in the last 168 hours. No results for input(s): AMMONIA in the last 168 hours. CBC:  Recent Labs Lab 02/07/14 0919 02/08/14 0718  WBC 11.4* 11.7*  NEUTROABS 8.9*  --   HGB 9.4* 9.4*  HCT 27.2* 28.1*  MCV 85.5 85.9  PLT 579* 501*   Cardiac Enzymes: No results for input(s): CKTOTAL, CKMB, CKMBINDEX, TROPONINI in the last 168 hours. BNP: No results for input(s): PROBNP in the last 168  hours. D-Dimer: No results for input(s): DDIMER in the last 168 hours. CBG: No results for input(s): GLUCAP in the last 168 hours. Hemoglobin A1C: No results for input(s): HGBA1C in the last 168 hours. Fasting Lipid Panel: No results for input(s): CHOL, HDL, LDLCALC, TRIG, CHOLHDL, LDLDIRECT in the last 168 hours. Thyroid Function Tests: No results for input(s): TSH, T4TOTAL, FREET4, T3FREE, THYROIDAB in the last 168 hours. Coagulation: No results for input(s): LABPROT, INR in the last 168 hours. Anemia Panel:  Recent Labs Lab 02/07/14 0919  RETICCTPCT 14.4*   Urine Drug Screen: Drugs of Abuse     Component Value Date/Time   LABOPIA NONE DETECTED 03/03/2013 1857   COCAINSCRNUR NONE DETECTED 03/03/2013 1857   LABBENZ NONE DETECTED 03/03/2013 1857   AMPHETMU NONE DETECTED 03/03/2013 1857   THCU POSITIVE* 03/03/2013 1857   LABBARB NONE DETECTED 03/03/2013 1857    Alcohol Level: No results for input(s): ETH in the last 168 hours. Urinalysis: No results for input(s): COLORURINE, LABSPEC, PHURINE, GLUCOSEU, HGBUR, BILIRUBINUR, KETONESUR, PROTEINUR, UROBILINOGEN, NITRITE, LEUKOCYTESUR in the last 168 hours.  Invalid input(s): APPERANCEUR  Micro Results: No results found for this or any previous visit (from the past 240 hour(s)). Studies/Results: Dg Hip Bilateral W/pelvis  02/08/2014   CLINICAL DATA:  Hip pain.  Sickle cell.  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 No acute fracture. No dislocation.  IMPRESSION: No acute bony pathology.  Stigmata of sickle cell disorder.   Electronically Signed   By: Maryclare Bean M.D.   On: 02/08/2014 13:21   Medications: I have reviewed the patient's current medications. Scheduled Meds: . folic acid  1 mg Oral Daily  . heparin  5,000 Units Subcutaneous 3 times per day  . HYDROmorphone PCA 0.3  mg/mL   Intravenous 6 times per day  . hydroxyurea  500 mg Oral BID   Continuous Infusions: . sodium chloride 150 mL/hr at 02/09/14 0400   PRN Meds:.diphenhydrAMINE **OR** diphenhydrAMINE, hydrocerin, naloxone **AND** sodium chloride, ondansetron (ZOFRAN) IV, oxyCODONE-acetaminophen, polyethylene glycol Assessment/Plan: Principal Problem:   Sickle cell crisis Active Problems:   Hypokalemia  Patient is a 23 year old gentleman with a history of sickle cell presents with a sickle cell pain crisis.  Sickle cell pain crisis: There has been improvement in the patient's back and right hip pain control after transitioning to Dilaudid PCA with 0.25 mg bolus with a 15 minute lockout. Although the exact boluses have not been charted, patient has been exhibiting a lower requirement for Dilaudid on the PCA since he has used a single 10 mL syringe (9 mg of Dilaudid) over the last 20 hours, which roughly represents half of the dosage of Dilaudid that he previously received when he was on 2 mg every 2 hours. Concern for complications are low at this point as patient remains afebrile with no chest pain and hip radiographs negative for AVN.  -Continue with PCA Dilaudid at same settings since pain control is better though not optimal. -Encourage concurrent use of Percocets and attempt to wean PCA later today or tomorrow. -Continue home hydroxyurea 500 mg twice a day and folic acid 1 mg daily -Continue MiraLAX when necessary (no requirement thus far)  Labs: Daily CBC  Diet: Regular diet Prophylaxis: Heparin subcutaneous Code: Full  Dispo: Disposition is deferred at this time, awaiting improvement of current medical problems.  Anticipated discharge in approximately 2 day(s).   The patient does not have a current PCP (No Pcp Per Patient) and does need an [facility] hospital follow-up appointment after discharge.  The patient does not have transportation limitations that hinder transportation to clinic  appointments.  .Services Needed at time of discharge: Y = Yes, Blank = No PT:   OT:   RN:   Equipment:   Other:     LOS: 2 days   Luan Moore, MD, PhD 02/09/2014, 8:17 AM

## 2014-02-09 NOTE — Discharge Summary (Signed)
Name: Timothy Marks MRN: [id number] DOB: Apr 20, 1990 23 y.o. PCP: [facility]  Date of Admission: 02/07/2014  9:06 AM Date of Discharge: 02/14/2014 Attending Physician: Dr. Marinda Elk  Discharge Diagnosis:  Principal Problem:   Sickle cell crisis Active Problems:   Hypokalemia  Discharge Medications:   Medication List    STOP taking these medications        predniSONE 10 MG tablet  Commonly known as:  DELTASONE      TAKE these medications        DSS 100 MG Caps  Take 100 mg by mouth 2 (two) times daily.     folic acid 1 MG tablet  Commonly known as:  FOLVITE  Take 1 mg by mouth daily.     hydroxyurea 500 MG capsule  Commonly known as:  HYDREA  Take 500 mg by mouth 2 (two) times daily. May take with food to minimize GI side effects.     ibuprofen 800 MG tablet  Commonly known as:  ADVIL,MOTRIN  Take 1 tablet (800 mg total) by mouth 3 (three) times daily.     methocarbamol 500 MG tablet  Commonly known as:  ROBAXIN  Take 1 tablet (500 mg total) by mouth 2 (two) times daily.     oxyCODONE-acetaminophen 5-325 MG per tablet  Commonly known as:  PERCOCET/ROXICET  Take 1-2 tablets by mouth every 4 (four) hours as needed for moderate pain.     potassium chloride 10 MEQ tablet  Commonly known as:  K-DUR  Take 10 mEq by mouth daily.     promethazine 25 MG tablet  Commonly known as:  PHENERGAN  Take 25 mg by mouth every 6 (six) hours as needed. For nausea        Disposition and follow-up:   Mr.Lyman Goar was discharged from [facility] in Good condition.  At the hospital follow up visit please address:  1.  Sickle Cell Anemia:  Patient was admitted for acute pain crisis without evidence or concern for AVN or Acute Chest. Patient was originally on Diluadid PCA then transitioned to Dilaudid IV and then Dilaudid po and Percocet and finally Percocet 5-325 1-2 tabs Q4H prn. Patient was discharged on Percocet with #40 tablets.  Please assess resolution of pain and compliance with hydroxyurea and folic acid.   Hypokalemia:  Patient has a history of hypokalemia for which he takes KDur 10 mEq daily at home. We continued this on discharge and patient will follow up with PCP.   2.  Labs / imaging needed at time of follow-up: BMET  3.  Pending labs/ test needing follow-up: None  Follow-up Appointments: Follow-up Information    Follow up with [facility].   Specialty:  Pediatrics   Why:  Hospital follow-up appointment   Contact information:   [address] [place] [address] [phone]       Follow up with [facility].   Why:  Visit clinic to sign a medical release form so that they can obtain your records from a previous clinic   Contact information:   [place] [phone]      Discharge Instructions: Discharge Instructions    Diet - low sodium heart healthy    Complete by:  As directed      Increase activity slowly    Complete by:  As directed  Consultations:    Procedures Performed:  Dg Hip Bilateral W/pelvis  02/08/2014   CLINICAL DATA:  Hip pain.  Sickle cell.  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 No acute fracture. No dislocation.  IMPRESSION: No acute bony pathology.  Stigmata of sickle cell disorder.   Electronically Signed   By: Maryclare Bean M.D.   On: 02/08/2014 13:21   Admission HPI:   Patient is a 23 year old gentleman with a history of sickle cell anemia who presents with symptoms of back pain and right leg pain that is consistent with his previous sickle cell crises. Patient states that his symptoms started last night. He is complaining of 9 out of 10 severity back pain and right leg pain that starts in his hip and goes down  to his knee into his feet. Patient states that usually sees a doctor in [facility] and has been taking all of his medications at home including hydroxyurea, folate, and Percocet. Patient states that he has had sickle cell crises in the past that have included symptoms consistent with acute chest including chest pain though he does not remember what therapy he received at an outside hospital for this. Patient otherwise currently denies any fevers, chills, chest pain, dyspnea, vomiting, nausea, abdominal pain, constipation, diarrhea, dysuria, or hematuria.  Patient is status post 5 mg Dilaudid and 1 L of normal saline bolus in the emergency department.  Hospital Course by problem list: Principal Problem:   Sickle cell crisis Active Problems:   Hypokalemia   Sickle Cell Pain Crisis: Patient initially presenting with back pain and right leg pain that was consistent with his previous episodes of sickle cell pain crises. Patient's initial reticulocyte count was elevated along with elevated bilirubin and scleral icterus. Plain radiographs of the hip were negative for avascular necrosis. Patient was initially started on Dilaudid 2 mg every 3 hours as well as IV fluids at 125 mL per hour (half-normal saline). However, patient's pain control was inadequate over his first night of admission and his Dilaudid was increased to 2 mg every 2 hours. IV fluids were increased to 150 mL per hour. The morning after, patient's pain continued to be poorly controlled. Patient was converted to a Dilaudid PCA with 0.25 mg bolus with a 15 minute lockout. Over the next ensuing 24 hours, patient's requirement for Dilaudid was roughly half of what he was previously on. On average, this translated to be roughly 1 mg every 2 hours. Patient was also written for concurrent Percocets and attempt to wean him off the PCA. Patient was continued on home hydroxyurea 500 mg twice a day and folic acid 1 mg daily. Diluadid PCA was transitioned to  Dilaudid IV and then Dilaudid po and Percocet and finally Percocet 5-325 1-2 tabs Q4H prn. Hemolytic anemia panel showed elevated direct bilirubin, indirect bilirubin, and total bilirubin 2.7. Normal LDH, 237. Low haptoglobin, 25. This is not unexpected for Sickle Cell Anemia. Patient was discharged on Percocet 5-325 1-2 tabs Q4H prn with #40 tablets. Please assess resolution of pain and compliance with hydroxyurea and folic acid.   Hypokalemia:  Potassium ranged 3.4-3.6 and was replaced with KDur 40 mEq. Patient has a history of hypokalemia for which he takes KDur 10 mEq daily at home. We continued this on discharge and patient will follow up with PCP.   Discharge Vitals:   BP  107/41 mmHg  Pulse 70  Temp(Src) 99 F (37.2 C) (Oral)  Resp 14  Ht 6\' 2"  (1.88 m)  Wt 61.236 kg (135 lb)  BMI 17.33 kg/m2  SpO2 100%  Discharge Labs:  No results found for this or any previous visit (from the past 24 hour(s)).  Signed: Osa Craver, DO PGY-1 Internal Medicine Resident Pager # [phone] 02/14/2014 12:43 PM  Services Ordered on Discharge: none Equipment Ordered on Discharge: none

## 2014-02-10 DIAGNOSIS — E876 Hypokalemia: Secondary | ICD-10-CM

## 2014-02-10 LAB — CBC
HCT: 28.6 % — ABNORMAL LOW (ref 39.0–52.0)
Hemoglobin: 9.5 g/dL — ABNORMAL LOW (ref 13.0–17.0)
MCH: 29.4 pg (ref 26.0–34.0)
MCHC: 33.2 g/dL (ref 30.0–36.0)
MCV: 88.5 fL (ref 78.0–100.0)
Platelets: 526 10*3/uL — ABNORMAL HIGH (ref 150–400)
RBC: 3.23 MIL/uL — ABNORMAL LOW (ref 4.22–5.81)
RDW: 19.2 % — ABNORMAL HIGH (ref 11.5–15.5)
WBC: 9.7 10*3/uL (ref 4.0–10.5)

## 2014-02-10 MED ORDER — OXYCODONE-ACETAMINOPHEN 5-325 MG PO TABS: 1.0000 | ORAL_TABLET | ORAL | Status: DC | PRN

## 2014-02-10 MED ORDER — DIPHENHYDRAMINE HCL 25 MG PO CAPS: 25.0000 mg | ORAL_CAPSULE | Freq: Four times a day (QID) | ORAL | Status: DC | PRN

## 2014-02-10 MED ORDER — HYDROMORPHONE HCL 1 MG/ML IJ SOLN
0.5000 mg | INTRAMUSCULAR | Status: DC | PRN
  Administered 2014-02-10 – 2014-02-11 (×6): 0.5 mg via INTRAVENOUS
  Filled 2014-02-10 (×6): qty 1

## 2014-02-10 MED ORDER — OXYCODONE-ACETAMINOPHEN 5-325 MG PO TABS
1.0000 | ORAL_TABLET | ORAL | Status: DC | PRN
  Administered 2014-02-10 – 2014-02-11 (×7): 2 via ORAL
  Filled 2014-02-10 (×7): qty 2

## 2014-02-10 NOTE — Progress Notes (Signed)
Patient has been doing well on just percocet. Now asking for IV pain medication. Called MD on call. New orders for IV dilaudid q3h prn. Joslyn Hy, MSN, RN, [employer]

## 2014-02-10 NOTE — Progress Notes (Signed)
Subjective:  Patient was seen and examined this morning. Patient continues to have 8/10 pain in his right leg from hip to foot. Pain is worse with any movement. He states he uses his PCA once every 2-3 hours. He denies any other complaints. Denies fever, chills, constipation.  Objective: Vital signs in last 24 hours: Filed Vitals:   02/10/14 0200 02/10/14 0636 02/10/14 0746 02/10/14 1222  BP: 124/65 121/57 127/60 133/62  Pulse: 57 58 55 61  Temp: 98.7 F (37.1 C) 99.2 F (37.3 C) 98.8 F (37.1 C) 98.6 F (37 C)  TempSrc: Oral Oral Oral Oral  Resp: 16 17 18 18   Height:      Weight:      SpO2: 98% 96% 98% 97%   Weight change:   Intake/Output Summary (Last 24 hours) at 02/10/14 1536 Last data filed at 02/10/14 1433  Gross per 24 hour  Intake   4070 ml  Output   3350 ml  Net    720 ml   General: Vital signs reviewed.  Patient is well-developed and well-nourished, in mild distress and cooperative with exam.  Cardiovascular: RRR, S1 normal, S2 normal, no murmurs, gallops, or rubs. Pulmonary/Chest: Clear to auscultation bilaterally, no wheezes, rales, or rhonchi. Abdominal: Soft, non-tender, non-distended, BS + Musculoskeletal: No joint deformities, erythema, or stiffness, ROM full and nontender. Extremities: No lower extremity edema bilaterally, pulses symmetric and intact bilaterally.  Neurological: A&O x3 Skin: Warm, dry and intact.  Psychiatric: Normal mood and affect. speech and behavior is normal. Cognition and memory are normal.   Lab Results: Basic Metabolic Panel:  Recent Labs Lab 02/08/14 0718 02/09/14 0815  NA 137 138  K 3.4* 3.5  CL 106 104  CO2 24 26  GLUCOSE 92 88  BUN <5* <5*  CREATININE 0.60 0.49*  CALCIUM 8.6 8.9   Liver Function Tests:  Recent Labs Lab 02/07/14 0919  AST 29  ALT 10  ALKPHOS 75  BILITOT 3.4*  PROT 7.0  ALBUMIN 4.0   CBC:  Recent Labs Lab 02/07/14 0919  02/09/14 0815 02/10/14 0712  WBC 11.4*  < > 10.5 9.7    NEUTROABS 8.9*  --   --   --   HGB 9.4*  < > 9.3* 9.5*  HCT 27.2*  < > 27.2* 28.6*  MCV 85.5  < > 86.1 88.5  PLT 579*  < > 536* 526*  < > = values in this interval not displayed.  Anemia Panel:  Recent Labs Lab 02/07/14 0919  RETICCTPCT 14.4*   Urine Drug Screen: Drugs of Abuse     Component Value Date/Time   LABOPIA NONE DETECTED 03/03/2013 1857   COCAINSCRNUR NONE DETECTED 03/03/2013 1857   LABBENZ NONE DETECTED 03/03/2013 1857   AMPHETMU NONE DETECTED 03/03/2013 1857   THCU POSITIVE* 03/03/2013 1857   LABBARB NONE DETECTED 03/03/2013 1857    Studies/Results: No results found. Medications: I have reviewed the patient's current medications. No current facility-administered medications on file prior to encounter.   Current Outpatient Prescriptions on File Prior to Encounter  Medication Sig Dispense Refill  . folic acid (FOLVITE) 1 MG tablet Take 1 mg by mouth daily.    . hydroxyurea (HYDREA) 500 MG capsule Take 500 mg by mouth 2 (two) times daily. May take with food to minimize GI side effects.    . methocarbamol (ROBAXIN) 500 MG tablet Take 1 tablet (500 mg total) by mouth 2 (two) times daily. 20 tablet 0  . oxyCODONE-acetaminophen (PERCOCET) 10-325 MG per  tablet Take 1 tablet by mouth every 4 (four) hours as needed for pain. 20 tablet 0  . potassium chloride (K-DUR) 10 MEQ tablet Take 10 mEq by mouth daily.     . predniSONE (DELTASONE) 10 MG tablet Take 2 tablets (20 mg total) by mouth daily. 15 tablet 0  . promethazine (PHENERGAN) 25 MG tablet Take 25 mg by mouth every 6 (six) hours as needed. For nausea    . oxyCODONE-acetaminophen (PERCOCET/ROXICET) 5-325 MG per tablet Take 2 tablets by mouth every 4 (four) hours as needed for severe pain. (Patient not taking: Reported on 02/07/2014) 15 tablet 0   Scheduled Meds: . docusate sodium  100 mg Oral BID  . folic acid  1 mg Oral Daily  . heparin  5,000 Units Subcutaneous 3 times per day  . hydroxyurea  500 mg Oral BID    Continuous Infusions: . sodium chloride 150 mL/hr at 02/10/14 0113   PRN Meds:.hydrocerin, HYDROmorphone (DILAUDID) injection, oxyCODONE-acetaminophen, polyethylene glycol Assessment/Plan: Principal Problem:   Sickle cell crisis Active Problems:   Hypokalemia  Patient is a 23 year old gentleman with a history of sickle cell presents with a sickle cell pain crisis.  Sickle Cell Pain Crisis: Patient continues to have 8/10 right leg pain from hip to foot. Patient was on Dilaudid PCA with 0.25 mg bolus with a 15 minute lockout. He states he has been using it once every 2-3 hours. Since midnight, patient pushed button 19 times and received 3.25 mg total of dilaudid in 9 hours (about 1-2 pushes every hour or about 1.5 mg Q4H of diluadid). No use of percocet in 2 days.  -Percocet 5-325 mg 1-2 tabs Q4H prn -Dilaudid 0.5 mg Q3H prn -Continue home hydroxyurea 500 mg twice a day  -Continue home Folic acid 1 mg daily -Continue Miralax daily prn -CBC tomorrow am  DVT/PE ppx: Heparin SQ TID  Dispo: Disposition is deferred at this time, awaiting improvement of current medical problems.  Anticipated discharge in approximately 1-2 day(s).   The patient does not have a current PCP (No Pcp Per Patient) and does need an [facility] hospital follow-up appointment after discharge.  The patient does not have transportation limitations that hinder transportation to clinic appointments.  .Services Needed at time of discharge: Y = Yes, Blank = No PT:   OT:   RN:   Equipment:   Other:     LOS: 3 days   Osa Craver, DO PGY-1 Internal Medicine Resident Pager # [phone] 02/10/2014 3:36 PM

## 2014-02-11 LAB — CBC
HCT: 26.7 % — ABNORMAL LOW (ref 39.0–52.0)
Hemoglobin: 8.9 g/dL — ABNORMAL LOW (ref 13.0–17.0)
MCH: 28.8 pg (ref 26.0–34.0)
MCHC: 33.3 g/dL (ref 30.0–36.0)
MCV: 86.4 fL (ref 78.0–100.0)
Platelets: 503 10*3/uL — ABNORMAL HIGH (ref 150–400)
RBC: 3.09 MIL/uL — ABNORMAL LOW (ref 4.22–5.81)
RDW: 18.4 % — ABNORMAL HIGH (ref 11.5–15.5)
WBC: 7.5 10*3/uL (ref 4.0–10.5)

## 2014-02-11 LAB — BASIC METABOLIC PANEL
Anion gap: 8 (ref 5–15)
BUN: <5 mg/dL — ABNORMAL LOW (ref 6–23)
CO2: 25 mmol/L (ref 19–32)
Calcium: 8.7 mg/dL (ref 8.4–10.5)
Chloride: 105 mEq/L (ref 96–112)
Creatinine, Ser: 0.55 mg/dL (ref 0.50–1.35)
GFR calc Af Amer: >90 mL/min (ref >90)
GFR calc non Af Amer: >90 mL/min (ref >90)
Glucose, Bld: 105 mg/dL — ABNORMAL HIGH (ref 70–99)
Potassium: 3.6 mmol/L (ref 3.5–5.1)
Sodium: 138 mmol/L (ref 135–145)

## 2014-02-11 LAB — BILIRUBIN, FRACTIONATED(TOT/DIR/INDIR)
Bilirubin, Direct: 0.5 mg/dL — ABNORMAL HIGH (ref 0.0–0.3)
Indirect Bilirubin: 2.2 mg/dL — ABNORMAL HIGH (ref 0.3–0.9)
Total Bilirubin: 2.7 mg/dL — ABNORMAL HIGH (ref 0.3–1.2)

## 2014-02-11 LAB — LACTATE DEHYDROGENASE: LDH: 237 U/L (ref 94–250)

## 2014-02-11 MED ORDER — BENZOCAINE 10 % MT GEL
Freq: Two times a day (BID) | OROMUCOSAL | Status: DC | PRN
  Administered 2014-02-11 (×3): via OROMUCOSAL
  Filled 2014-02-11: qty 9.4

## 2014-02-11 MED ORDER — POTASSIUM CHLORIDE CRYS ER 20 MEQ PO TBCR
40.0000 meq | EXTENDED_RELEASE_TABLET | Freq: Once | ORAL | Status: AC
  Administered 2014-02-11: 40 meq via ORAL
  Filled 2014-02-11: qty 2

## 2014-02-11 MED ORDER — HYDROMORPHONE HCL 2 MG PO TABS: 2.0000 mg | ORAL_TABLET | ORAL | Status: DC | PRN

## 2014-02-11 MED ORDER — IBUPROFEN 800 MG PO TABS
800.0000 mg | ORAL_TABLET | Freq: Once | ORAL | Status: AC
  Administered 2014-02-11: 800 mg via ORAL
  Filled 2014-02-11: qty 1

## 2014-02-11 MED ORDER — HYDROMORPHONE HCL 2 MG PO TABS
2.0000 mg | ORAL_TABLET | ORAL | Status: DC | PRN
  Administered 2014-02-11 – 2014-02-12 (×3): 2 mg via ORAL
  Filled 2014-02-11 (×3): qty 1

## 2014-02-11 MED ORDER — IBUPROFEN 800 MG PO TABS
800.0000 mg | ORAL_TABLET | Freq: Three times a day (TID) | ORAL | Status: DC
  Administered 2014-02-11 – 2014-02-12 (×3): 800 mg via ORAL
  Filled 2014-02-11 (×5): qty 1

## 2014-02-11 NOTE — Progress Notes (Signed)
Subjective:  Patient was seen and examined this morning. Patient continues to have pain in his right hip with radiation into right foot. Pain is a 10/10 currently. He states pain medications help, but only last for so long and then pain returns. Pain is worse with ambulation. He states this is typical of his pain crises as he is normally in the hospital for 1.5 weeks and once he was admitted for 2 months. His pain normally occurs in his back and legs. He would like to establish care in the [place] area at a [facility] as he is currently traveling to [place]. Patient has not other complaints other than his pain. When he takes Morphine, he states he breaks out in a rash.   Objective: Vital signs in last 24 hours: Filed Vitals:   02/10/14 0746 02/10/14 1222 02/10/14 2137 02/11/14 0514  BP: 127/60 133/62 121/75 141/80  Pulse: 55 61  59  Temp: 98.8 F (37.1 C) 98.6 F (37 C) 99.1 F (37.3 C) 98.6 F (37 C)  TempSrc: Oral Oral Oral Oral  Resp: 18 18 18 14   Height:      Weight:      SpO2: 98% 97% 98% 100%   Weight change:   Intake/Output Summary (Last 24 hours) at 02/11/14 0956 Last data filed at 02/11/14 7371  Gross per 24 hour  Intake   4886 ml  Output   3300 ml  Net   1586 ml   General: Vital signs reviewed.  Patient is well-developed and well-nourished, in mild distress and cooperative with exam.  Cardiovascular: RRR, S1 normal, S2 normal, no murmurs, gallops, or rubs. Pulmonary/Chest: Clear to auscultation bilaterally, no wheezes, rales, or rhonchi. Abdominal: Soft, non-tender, non-distended, BS + Musculoskeletal: No joint deformities, erythema, or stiffness, tender on palpation of right hip. Extremities: No lower extremity edema bilaterally, pulses symmetric and intact bilaterally.  Neurological: A&O x3 Skin: Warm, dry and intact.  Psychiatric: Normal mood and affect. speech and behavior is normal. Cognition and memory are normal.   Lab Results: Basic  Metabolic Panel:  Recent Labs Lab 02/09/14 0815 02/11/14 0805  NA 138 138  K 3.5 3.6  CL 104 105  CO2 26 25  GLUCOSE 88 105*  BUN <5* <5*  CREATININE 0.49* 0.55  CALCIUM 8.9 8.7   Liver Function Tests:  Recent Labs Lab 02/07/14 0919  AST 29  ALT 10  ALKPHOS 75  BILITOT 3.4*  PROT 7.0  ALBUMIN 4.0   CBC:  Recent Labs Lab 02/07/14 0919  02/10/14 0712 02/11/14 0805  WBC 11.4*  < > 9.7 7.5  NEUTROABS 8.9*  --   --   --   HGB 9.4*  < > 9.5* 8.9*  HCT 27.2*  < > 28.6* 26.7*  MCV 85.5  < > 88.5 86.4  PLT 579*  < > 526* 503*  < > = values in this interval not displayed.  Anemia Panel:  Recent Labs Lab 02/07/14 0919  RETICCTPCT 14.4*   Urine Drug Screen: Drugs of Abuse     Component Value Date/Time   LABOPIA NONE DETECTED 03/03/2013 1857   COCAINSCRNUR NONE DETECTED 03/03/2013 1857   LABBENZ NONE DETECTED 03/03/2013 1857   AMPHETMU NONE DETECTED 03/03/2013 1857   THCU POSITIVE* 03/03/2013 1857   LABBARB NONE DETECTED 03/03/2013 1857    Medications: I have reviewed the patient's current medications. No current facility-administered medications on file prior to encounter.   Current Outpatient Prescriptions on File Prior to Encounter  Medication Sig Dispense Refill  . folic acid (FOLVITE) 1 MG tablet Take 1 mg by mouth daily.    . hydroxyurea (HYDREA) 500 MG capsule Take 500 mg by mouth 2 (two) times daily. May take with food to minimize GI side effects.    . methocarbamol (ROBAXIN) 500 MG tablet Take 1 tablet (500 mg total) by mouth 2 (two) times daily. 20 tablet 0  . oxyCODONE-acetaminophen (PERCOCET) 10-325 MG per tablet Take 1 tablet by mouth every 4 (four) hours as needed for pain. 20 tablet 0  . potassium chloride (K-DUR) 10 MEQ tablet Take 10 mEq by mouth daily.     . predniSONE (DELTASONE) 10 MG tablet Take 2 tablets (20 mg total) by mouth daily. 15 tablet 0  . promethazine (PHENERGAN) 25 MG tablet Take 25 mg by mouth every 6 (six) hours as needed.  For nausea    . oxyCODONE-acetaminophen (PERCOCET/ROXICET) 5-325 MG per tablet Take 2 tablets by mouth every 4 (four) hours as needed for severe pain. (Patient not taking: Reported on 02/07/2014) 15 tablet 0   Scheduled Meds: . docusate sodium  100 mg Oral BID  . folic acid  1 mg Oral Daily  . heparin  5,000 Units Subcutaneous 3 times per day  . hydroxyurea  500 mg Oral BID  . ibuprofen  800 mg Oral Once  . potassium chloride  40 mEq Oral Once   Continuous Infusions: . sodium chloride 150 mL/hr at 02/11/14 0728   PRN Meds:.benzocaine, diphenhydrAMINE, hydrocerin, HYDROmorphone, oxyCODONE-acetaminophen, polyethylene glycol Assessment/Plan: Principal Problem:   Sickle cell crisis Active Problems:   Hypokalemia  Patient is a 23 year old gentleman with a history of sickle cell presents with a sickle cell pain crisis.  Sickle Cell Pain Crisis: Since yesterday, patient has been using percocets 2 tabs every 4 hours and dilaudid 0.5 mg IV every 3 hours. Pain is a 10/10. After discussion with patient, he will transfer care to [facility] from [facility]. He understands that he needs to go to the clinic to sign a release form for his records from [facility] and that I will provide his with the information for the location of the clinic. We will transition to dilaudid po to attempt to control pain with oral medications.  -Percocet 5-325 mg 1-2 tabs Q4H prn -Dilaudid 2 mg po Q3H prn -Ibuprofen 800 mg once -Continue home hydroxyurea 500 mg twice a day  -Continue home Folic acid 1 mg daily -Continue Miralax daily prn  Hypokalemia: Potassium has trended 3.4>3.5>3.6 after replacement. -KDur 40 mEq once -Repeat BMET tomorrow am   DVT/PE ppx: Heparin SQ TID  Dispo: Disposition is deferred at this time, awaiting improvement of current medical problems.  Anticipated discharge in approximately 1-2 day(s).   The patient does not have a current PCP (No Pcp Per Patient) and does need an [facility] hospital  follow-up appointment after discharge.  The patient does not have transportation limitations that hinder transportation to clinic appointments.  .Services Needed at time of discharge: Y = Yes, Blank = No PT:   OT:   RN:   Equipment:   Other:     LOS: 4 days   Osa Craver, DO PGY-1 Internal Medicine Resident Pager # [phone] 02/11/2014 9:56 AM

## 2014-02-12 LAB — HAPTOGLOBIN: Haptoglobin: <25 mg/dL — ABNORMAL LOW (ref 45–215)

## 2014-02-12 MED ORDER — DSS 100 MG PO CAPS
100.0000 mg | ORAL_CAPSULE | Freq: Two times a day (BID) | ORAL | Status: DC
Start: 2014-02-12 — End: 2014-04-09

## 2014-02-12 MED ORDER — IBUPROFEN 800 MG PO TABS: 800.0000 mg | ORAL_TABLET | Freq: Three times a day (TID) | ORAL | Status: DC

## 2014-02-12 MED ORDER — OXYCODONE-ACETAMINOPHEN 5-325 MG PO TABS: 1.0000 | ORAL_TABLET | ORAL | Status: DC | PRN

## 2014-02-12 NOTE — Progress Notes (Signed)
Subjective:  Patient was seen and examined this morning. Patient is feeling much better this morning. Pain in his right leg is better controlled and is a 4-5/10. Patient denies any other symptoms and requests to go home.   Objective: Vital signs in last 24 hours: Filed Vitals:   02/11/14 0514 02/11/14 1400 02/11/14 2117 02/12/14 0518  BP: 141/80 129/73 138/73 107/41  Pulse: 59  70   Temp: 98.6 F (37 C) 98.7 F (37.1 C) 99.7 F (37.6 C) 99 F (37.2 C)  TempSrc: Oral Oral Oral Oral  Resp: 14 16 14 14   Height:      Weight:      SpO2: 100% 100% 100% 100%   Weight change:   Intake/Output Summary (Last 24 hours) at 02/12/14 1159 Last data filed at 02/12/14 0605  Gross per 24 hour  Intake    240 ml  Output   1175 ml  Net   -935 ml   General: Vital signs reviewed.  Patient is well-developed and well-nourished, in no acute distress and cooperative with exam.  Cardiovascular: RRR, S1 normal, S2 normal, no murmurs, gallops, or rubs. Pulmonary/Chest: Clear to auscultation bilaterally, no wheezes, rales, or rhonchi. Abdominal: Soft, non-tender, non-distended, BS + Musculoskeletal: No joint deformities, erythema, or stiffness, tender on palpation of right hip. Extremities: No lower extremity edema bilaterally, pulses symmetric and intact bilaterally.  Neurological: A&O x3 Skin: Warm, dry and intact.  Psychiatric: Normal mood and affect. speech and behavior is normal. Cognition and memory are normal.   Lab Results: Basic Metabolic Panel:  Recent Labs Lab 02/09/14 0815 02/11/14 0805  NA 138 138  K 3.5 3.6  CL 104 105  CO2 26 25  GLUCOSE 88 105*  BUN <5* <5*  CREATININE 0.49* 0.55  CALCIUM 8.9 8.7   Liver Function Tests:  Recent Labs Lab 02/07/14 0919 02/11/14 1503  AST 29  --   ALT 10  --   ALKPHOS 75  --   BILITOT 3.4* 2.7*  PROT 7.0  --   ALBUMIN 4.0  --    CBC:  Recent Labs Lab 02/07/14 0919  02/10/14 0712 02/11/14 0805  WBC 11.4*  < > 9.7 7.5    NEUTROABS 8.9*  --   --   --   HGB 9.4*  < > 9.5* 8.9*  HCT 27.2*  < > 28.6* 26.7*  MCV 85.5  < > 88.5 86.4  PLT 579*  < > 526* 503*  < > = values in this interval not displayed.  Anemia Panel:  Recent Labs Lab 02/07/14 0919  RETICCTPCT 14.4*   Urine Drug Screen: Drugs of Abuse     Component Value Date/Time   LABOPIA NONE DETECTED 03/03/2013 1857   COCAINSCRNUR NONE DETECTED 03/03/2013 1857   LABBENZ NONE DETECTED 03/03/2013 1857   AMPHETMU NONE DETECTED 03/03/2013 1857   THCU POSITIVE* 03/03/2013 1857   LABBARB NONE DETECTED 03/03/2013 1857    Medications: I have reviewed the patient's current medications. No current facility-administered medications on file prior to encounter.   Current Outpatient Prescriptions on File Prior to Encounter  Medication Sig Dispense Refill  . folic acid (FOLVITE) 1 MG tablet Take 1 mg by mouth daily.    . hydroxyurea (HYDREA) 500 MG capsule Take 500 mg by mouth 2 (two) times daily. May take with food to minimize GI side effects.    . methocarbamol (ROBAXIN) 500 MG tablet Take 1 tablet (500 mg total) by mouth 2 (two) times daily. 20 tablet  0  . oxyCODONE-acetaminophen (PERCOCET) 10-325 MG per tablet Take 1 tablet by mouth every 4 (four) hours as needed for pain. 20 tablet 0  . potassium chloride (K-DUR) 10 MEQ tablet Take 10 mEq by mouth daily.     . predniSONE (DELTASONE) 10 MG tablet Take 2 tablets (20 mg total) by mouth daily. 15 tablet 0  . promethazine (PHENERGAN) 25 MG tablet Take 25 mg by mouth every 6 (six) hours as needed. For nausea    . oxyCODONE-acetaminophen (PERCOCET/ROXICET) 5-325 MG per tablet Take 2 tablets by mouth every 4 (four) hours as needed for severe pain. (Patient not taking: Reported on 02/07/2014) 15 tablet 0   Scheduled Meds: . docusate sodium  100 mg Oral BID  . folic acid  1 mg Oral Daily  . heparin  5,000 Units Subcutaneous 3 times per day  . hydroxyurea  500 mg Oral BID  . ibuprofen  800 mg Oral TID    Continuous Infusions:   PRN Meds:.benzocaine, diphenhydrAMINE, hydrocerin, HYDROmorphone, oxyCODONE-acetaminophen, polyethylene glycol Assessment/Plan: Principal Problem:   Sickle cell crisis Active Problems:   Hypokalemia  Patient is a 23 year old gentleman with a history of sickle cell presents with a sickle cell pain crisis.  Sickle Cell Pain Crisis: Patient is much improved and patient requests to go home. Since yesterday, patient had 2 tabs of percocet 5-325 1400 yesterday and had dilaudid 2 mg po at 1700 yesterday and 0600 today. Patient has sickle cell anemia with hemoglobin baseline around 9. Hemolytic anemia panel showed elevated direct bilirubin, indirect bilirubin, and total bilirubin 2.7. Normal LDH, 237. Low haptoglobin, 25. This is not unexpected for Sickle Cell Anemia. Hemoglobin is at baseline (between 8.8 and 11). Patient will be discharged on percocet 5-325 1-2 tabs Q4H prn #40 for pain with follow up at sickle cell clinic and primary care office.  -Percocet 5-325 mg 1-2 tabs Q4H prn -Ibuprofen 800 mg TID -Continue home hydroxyurea 500 mg twice a day  -Continue home Folic acid 1 mg daily -Continue Miralax daily prn -PT  DVT/PE ppx: Heparin SQ TID  Dispo: Disposition is deferred at this time, awaiting improvement of current medical problems.  Anticipated discharge today.   The patient does not have a current PCP (No Pcp Per Patient) and does need an [facility] hospital follow-up appointment after discharge.  The patient does not have transportation limitations that hinder transportation to clinic appointments.  .Services Needed at time of discharge: Y = Yes, Blank = No PT:   OT:   RN:   Equipment:   Other:     LOS: 5 days   Osa Craver, DO PGY-1 Internal Medicine Resident Pager # [phone] 02/12/2014 11:59 AM

## 2014-02-12 NOTE — Discharge Instructions (Signed)
Thank you for allowing [place] to be involved in your healthcare while you were hospitalized at [facility].   Please note that there have been changes to your home medications.  --> PLEASE LOOK AT YOUR DISCHARGE MEDICATION LIST FOR DETAILS.  Please call your PCP if you have any questions or concerns, or any difficulty getting any of your medications.  Please return to the ER if you have worsening of your symptoms or new severe symptoms arise.   Please follow up with the [facility] in [place]. You will need to go there to fill out medical release form for them to get your information from your previous [facility]. The phone number and address are: [address] [place]  [phone]  Please call and make an appointment to follow up with a primary care physician at the following location. You have been seen here before.  [place] [phone]   Sickle Cell Anemia, Adult Sickle cell anemia is a condition in which red blood cells have an abnormal "sickle" shape. This abnormal shape shortens the cells' life span, which results in a lower than normal concentration of red blood cells in the blood. The sickle shape also causes the cells to clump together and block free blood flow through the blood vessels. As a result, the tissues and organs of the body do not receive enough oxygen. Sickle cell anemia causes organ damage and pain and increases the risk of infection. CAUSES  Sickle cell anemia is a genetic disorder. Those who receive two copies of the gene have the condition, and those who receive one copy have the trait. RISK FACTORS The sickle cell gene is most common in people whose families originated in [place]. Other areas of the globe where sickle cell trait occurs include the [place], [place] and [place], and the [place].  SIGNS AND SYMPTOMS  Pain, especially in  the extremities, back, chest, or abdomen (common). The pain may start suddenly or may develop following an illness, especially if there is dehydration. Pain can also occur due to overexertion or exposure to extreme temperature changes.  Frequent severe bacterial infections, especially certain types of pneumonia and meningitis.  Pain and swelling in the hands and feet.  Decreased activity.   Loss of appetite.   Change in behavior.  Headaches.  Seizures.  Shortness of breath or difficulty breathing.  Vision changes.  Skin ulcers. Those with the trait may not have symptoms or they may have mild symptoms.  DIAGNOSIS  Sickle cell anemia is diagnosed with blood tests that demonstrate the genetic trait. It is often diagnosed during the newborn period, due to mandatory testing nationwide. A variety of blood tests, X-rays, CT scans, MRI scans, ultrasounds, and lung function tests may also be done to monitor the condition. TREATMENT  Sickle cell anemia may be treated with:  Medicines. You may be given pain medicines, antibiotic medicines (to treat and prevent infections) or medicines to increase the production of certain types of hemoglobin.  Fluids.  Oxygen.  Blood transfusions. HOME CARE INSTRUCTIONS   Drink enough fluid to keep your urine clear or pale yellow. Increase your fluid intake in hot weather and during exercise.  Do not smoke. Smoking lowers oxygen levels in the blood.   Only take over-the-counter or prescription medicines for pain, fever, or discomfort as directed by your health care provider.  Take antibiotics as directed by your health  care provider. Make sure you finish them it even if you start to feel better.   Take supplements as directed by your health care provider.   Consider wearing a medical alert bracelet. This tells anyone caring for you in an emergency of your condition.   When traveling, keep your medical information, health care provider's  names, and the medicines you take with you at all times.   If you develop a fever, do not take medicines to reduce the fever right away. This could cover up a problem that is developing. Notify your health care provider.  Keep all follow-up appointments with your health care provider. Sickle cell anemia requires regular medical care. SEEK MEDICAL CARE IF: You have a fever. SEEK IMMEDIATE MEDICAL CARE IF:   You feel dizzy or faint.   You have new abdominal pain, especially on the left side near the stomach area.   You develop a persistent, often uncomfortable and painful penile erection (priapism). If this is not treated immediately it will lead to impotence.   You have numbness your arms or legs or you have a hard time moving them.   You have a hard time with speech.   You have a fever or persistent symptoms for more than 2-3 days.   You have a fever and your symptoms suddenly get worse.   You have signs or symptoms of infection. These include:   Chills.   Abnormal tiredness (lethargy).   Irritability.   Poor eating.   Vomiting.   You develop pain that is not helped with medicine.   You develop shortness of breath.  You have pain in your chest.   You are coughing up pus-like or bloody sputum.   You develop a stiff neck.  Your feet or hands swell or have pain.  Your abdomen appears bloated.  You develop joint pain. MAKE SURE YOU:  Understand these instructions. Document Released: 05/11/2005 Document Revised: 06/17/2013 Document Reviewed: 09/12/2012 [facility] Patient Information 2015 [place], [place]. This information is not intended to replace advice given to you by your health care provider. Make sure you discuss any questions you have with your health care provider.

## 2014-02-12 NOTE — Progress Notes (Signed)
PT Cancellation Note  Patient Details Name: Timothy Marks MRN: [id number] DOB: 06-09-1990   Cancelled Treatment:    Reason Eval/Treat Not Completed: PT screened, no needs identified, will sign off Patient states he does not need physical therapy services. RN reports pt has been independent with mobility. Will sign-off at this time. Please re-order for change in status.  [place], [place]   Ellouise Newer 02/12/2014, 12:29 PM

## 2014-02-12 NOTE — Progress Notes (Signed)
CARE MANAGEMENT NOTE 02/12/2014  Patient:  Timothy Marks, Timothy Marks   Account Number:  [id number]  Date Initiated:  02/12/2014  Documentation initiated by:  [facility]  Subjective/Objective Assessment:   Sickle Cell Crisis     Action/Plan:   Anticipated DC Date:  02/12/2014   Anticipated DC Plan:  [place]  CM consult      Choice offered to / List presented to:             Status of service:  Completed, signed off Medicare Important Message given?   (If response is "NO", the following Medicare IM given date fields will be blank) Date Medicare IM given:   Medicare IM given by:   Date Additional Medicare IM given:   Additional Medicare IM given by:    Discharge Disposition:  HOME/SELF CARE  Per UR Regulation:    If discussed at Long Length of Stay Meetings, dates discussed:    Comments:  02/12/2014 1200 NCM spoke to pt and has Medicaid to cover his meds. Lovenox not ordered at dc. Jonnie Finner RN CCM Case Mgmt phone [phone]

## 2014-02-12 NOTE — Progress Notes (Signed)
Timothy Marks discharged Home with mom per MD order.  Discharge instructions reviewed and discussed with the patient, all questions and concerns answered. Copy of instructions, care notes and scripts given to patient.    Medication List    TAKE these medications        DSS 100 MG Caps  Take 100 mg by mouth 2 (two) times daily.     folic acid 1 MG tablet  Commonly known as:  FOLVITE  Take 1 mg by mouth daily.     hydroxyurea 500 MG capsule  Commonly known as:  HYDREA  Take 500 mg by mouth 2 (two) times daily. May take with food to minimize GI side effects.     ibuprofen 800 MG tablet  Commonly known as:  ADVIL,MOTRIN  Take 1 tablet (800 mg total) by mouth 3 (three) times daily.     methocarbamol 500 MG tablet  Commonly known as:  ROBAXIN  Take 1 tablet (500 mg total) by mouth 2 (two) times daily.     oxyCODONE-acetaminophen 5-325 MG per tablet  Commonly known as:  PERCOCET/ROXICET  Take 1-2 tablets by mouth every 4 (four) hours as needed for moderate pain.     potassium chloride 10 MEQ tablet  Commonly known as:  K-DUR  Take 10 mEq by mouth daily.     predniSONE 10 MG tablet  Commonly known as:  DELTASONE  Take 2 tablets (20 mg total) by mouth daily.     promethazine 25 MG tablet  Commonly known as:  PHENERGAN  Take 25 mg by mouth every 6 (six) hours as needed. For nausea        Patients skin is clean, dry and intact, no evidence of skin break down. IV site discontinued and catheter remains intact. Site without signs and symptoms of complications. Dressing and pressure applied.  Patient escorted to car by NT ambulating,  no distress noted upon discharge.  [name], Timothy Marks 02/12/2014 12:44 PM

## 2014-03-21 ENCOUNTER — Encounter (HOSPITAL_COMMUNITY): Payer: Self-pay | Admitting: Emergency Medicine

## 2014-03-21 ENCOUNTER — Emergency Department (INDEPENDENT_AMBULATORY_CARE_PROVIDER_SITE_OTHER)
Admission: EM | Admit: 2014-03-21 | Discharge: 2014-03-21 | Disposition: A | Discharge status: Nursing Home (Includes ICF) | Payer: Medicaid Other | Source: Home / Self Care

## 2014-03-21 ENCOUNTER — Emergency Department (HOSPITAL_COMMUNITY)
Admission: EM | Admit: 2014-03-21 | Discharge: 2014-03-21 | Disposition: A | Discharge status: Home / Self Care | Payer: Medicaid Other | Attending: Emergency Medicine | Admitting: Emergency Medicine

## 2014-03-21 ENCOUNTER — Encounter (HOSPITAL_COMMUNITY): Payer: Self-pay

## 2014-03-21 DIAGNOSIS — D5700 Hb-SS disease with crisis, unspecified: Secondary | ICD-10-CM

## 2014-03-21 DIAGNOSIS — M5489 Other dorsalgia: Secondary | ICD-10-CM

## 2014-03-21 DIAGNOSIS — Z79899 Other long term (current) drug therapy: Secondary | ICD-10-CM | POA: Diagnosis not present

## 2014-03-21 DIAGNOSIS — M545 Low back pain, unspecified: Secondary | ICD-10-CM

## 2014-03-21 LAB — CBC WITH DIFFERENTIAL/PLATELET
Basophils Absolute: 0 10*3/uL (ref 0.0–0.1)
Basophils Relative: 0 % (ref 0–1)
Eosinophils Absolute: 0.1 10*3/uL (ref 0.0–0.7)
Eosinophils Relative: 1 % (ref 0–5)
HCT: 30.1 % — ABNORMAL LOW (ref 39.0–52.0)
Hemoglobin: 10.9 g/dL — ABNORMAL LOW (ref 13.0–17.0)
Lymphocytes Relative: 25 % (ref 12–46)
Lymphs Abs: 2.8 10*3/uL (ref 0.7–4.0)
MCH: 29.9 pg (ref 26.0–34.0)
MCHC: 36.2 g/dL — ABNORMAL HIGH (ref 30.0–36.0)
MCV: 82.5 fL (ref 78.0–100.0)
Monocytes Absolute: 0.8 10*3/uL (ref 0.1–1.0)
Monocytes Relative: 8 % (ref 3–12)
Neutro Abs: 7.2 10*3/uL (ref 1.7–7.7)
Neutrophils Relative %: 66 % (ref 43–77)
Platelets: 432 10*3/uL — ABNORMAL HIGH (ref 150–400)
RBC: 3.65 MIL/uL — ABNORMAL LOW (ref 4.22–5.81)
RDW: 18.7 % — ABNORMAL HIGH (ref 11.5–15.5)
WBC: 10.9 10*3/uL — ABNORMAL HIGH (ref 4.0–10.5)

## 2014-03-21 LAB — RETICULOCYTES
RBC.: 3.65 MIL/uL — ABNORMAL LOW (ref 4.22–5.81)
Retic Count, Absolute: 339.5 10*3/uL — ABNORMAL HIGH (ref 19.0–186.0)
Retic Ct Pct: 9.3 % — ABNORMAL HIGH (ref 0.4–3.1)

## 2014-03-21 MED ORDER — OXYCODONE-ACETAMINOPHEN 5-325 MG PO TABS
1.0000 | ORAL_TABLET | ORAL | Status: DC | PRN
Start: 2014-03-21 — End: 2014-04-09

## 2014-03-21 MED ORDER — HYDROMORPHONE HCL 1 MG/ML IJ SOLN
2.0000 mg | Freq: Once | INTRAMUSCULAR | Status: AC
  Administered 2014-03-21: 2 mg via INTRAVENOUS
  Filled 2014-03-21: qty 2

## 2014-03-21 MED ORDER — SODIUM CHLORIDE 0.9 % IV SOLN
1000.0000 mL | Freq: Once | INTRAVENOUS | Status: AC
  Administered 2014-03-21: 1000 mL via INTRAVENOUS

## 2014-03-21 NOTE — ED Notes (Signed)
Patient was advised his stay at the [facility] will likely end in a transfer to the hospital, as we cannot provide care for his problem at this facility

## 2014-03-21 NOTE — ED Notes (Signed)
Pt presents with sickle cell pain crisis to lower back that began this morning.  Pt seen at Urgent Care and referred here.

## 2014-03-21 NOTE — ED Notes (Signed)
Pt given Apple juice, PA stated okay.

## 2014-03-21 NOTE — ED Notes (Signed)
C/o lower back pain onset this am; hx of Sickle Cell Denies inj/trauma Alert, no signs of acute distress.

## 2014-03-21 NOTE — ED Provider Notes (Signed)
CSN: [id number]     Arrival date & time 03/21/14  1312 History   None    Chief Complaint  Patient presents with  . Sickle Cell Pain Crisis   (Consider location/radiation/quality/duration/timing/severity/associated sxs/prior Treatment) HPI           24 year old male with history of sickle cell anemia presents complaining of sickle cell pain crisis. He has low back pain that is typical of his toe pain crises. He said he is not have one since December. He was admitted to the hospital then, he states he is here to get pain medicine. No numbness or weakness. No loss of bowel or bladder control. In the chart, I can see that he was seen 6 days ago at [facility] for low back pain and sickle cell crisis although he denies this and says he was seen there for chest pain. I read through the attending physician's note and there is no mention of any chest pain, just back pain.  Past Medical History  Diagnosis Date  . Sickle cell anemia    Past Surgical History  Procedure Laterality Date  . Cholecystectomy     No family history on file. History  Substance Use Topics  . Smoking status: Never Smoker   . Smokeless tobacco: Never Used  . Alcohol Use: No    Review of Systems  Musculoskeletal: Positive for back pain.  All other systems reviewed and are negative.   Allergies  Morphine and related; Toradol; Tramadol; Vancomycin; and Zosyn  Home Medications   Prior to Admission medications   Medication Sig Start Date End Date Taking? Authorizing Provider  folic acid (FOLVITE) 1 MG tablet Take 1 mg by mouth daily.   Yes Historical Provider, MD  oxyCODONE-acetaminophen (PERCOCET/ROXICET) 5-325 MG per tablet Take 1-2 tablets by mouth every 4 (four) hours as needed for moderate pain. 02/12/14  Yes Alexa Marvel Plan, MD  docusate sodium 100 MG CAPS Take 100 mg by mouth 2 (two) times daily. 02/12/14   Osa Craver, MD  hydroxyurea (HYDREA) 500 MG capsule Take 500 mg by mouth 2 (two)  times daily. May take with food to minimize GI side effects.    Historical Provider, MD  ibuprofen (ADVIL,MOTRIN) 800 MG tablet Take 1 tablet (800 mg total) by mouth 3 (three) times daily. 02/12/14   Osa Craver, MD  methocarbamol (ROBAXIN) 500 MG tablet Take 1 tablet (500 mg total) by mouth 2 (two) times daily. 10/06/13   Fransico Meadow, PA-C  potassium chloride (K-DUR) 10 MEQ tablet Take 10 mEq by mouth daily.     Historical Provider, MD  predniSONE (DELTASONE) 10 MG tablet Take 2 tablets (20 mg total) by mouth daily. 10/06/13   Fransico Meadow, PA-C  promethazine (PHENERGAN) 25 MG tablet Take 25 mg by mouth every 6 (six) hours as needed. For nausea 08/27/13   Historical Provider, MD   BP 123/74 mmHg  Pulse 78  Temp(Src) 99.9 F (37.7 C) (Oral)  Resp 14  SpO2 100% Physical Exam  Constitutional: He is oriented to person, place, and time. He appears well-developed and well-nourished. No distress.  Thin habitus  HENT:  Head: Normocephalic.  Pulmonary/Chest: Effort normal. No respiratory distress.  Neurological: He is alert and oriented to person, place, and time. Coordination normal.  Skin: Skin is warm and dry. No rash noted. He is not diaphoretic.  Psychiatric: Judgment normal. He exhibits a depressed mood (depressed, flat affect).  Nursing note and vitals reviewed.   ED Course  Procedures (including critical care time) Labs Review Labs Reviewed - No data to display  Imaging Review No results found.   MDM   1. Other back pain   2. Sickle cell pain crisis    He previously required admission for management of his pain, it is not appropriate to evaluate and treat this patient in urgent care. Also I will not discharge him with any narcotics, I feel he is exhibiting drug-seeking behavior. He is not yet established at the sickle cell center so his only recourse will be to go to the emergency department today.    Liam Graham, PA-C 03/21/14 1416

## 2014-03-21 NOTE — Discharge Instructions (Signed)
Back Pain, Adult Low back pain is very common. About 1 in 5 people have back pain.The cause of low back pain is rarely dangerous. The pain often gets better over time.About half of people with a sudden onset of back pain feel better in just 2 weeks. About 8 in 10 people feel better by 6 weeks.  CAUSES Some common causes of back pain include:  Strain of the muscles or ligaments supporting the spine.  Wear and tear (degeneration) of the spinal discs.  Arthritis.  Direct injury to the back. DIAGNOSIS Most of the time, the direct cause of low back pain is not known.However, back pain can be treated effectively even when the exact cause of the pain is unknown.Answering your caregiver's questions about your overall health and symptoms is one of the most accurate ways to make sure the cause of your pain is not dangerous. If your caregiver needs more information, he or she may order lab work or imaging tests (X-rays or MRIs).However, even if imaging tests show changes in your back, this usually does not require surgery. HOME CARE INSTRUCTIONS For many people, back pain returns.Since low back pain is rarely dangerous, it is often a condition that people can learn to manageon their own.   Remain active. It is stressful on the back to sit or stand in one place. Do not sit, drive, or stand in one place for more than 30 minutes at a time. Take short walks on level surfaces as soon as pain allows.Try to increase the length of time you walk each day.  Do not stay in bed.Resting more than 1 or 2 days can delay your recovery.  Do not avoid exercise or work.Your body is made to move.It is not dangerous to be active, even though your back may hurt.Your back will likely heal faster if you return to being active before your pain is gone.  Pay attention to your body when you bend and lift. Many people have less discomfortwhen lifting if they bend their knees, keep the load close to their bodies,and  avoid twisting. Often, the most comfortable positions are those that put less stress on your recovering back.  Find a comfortable position to sleep. Use a firm mattress and lie on your side with your knees slightly bent. If you lie on your back, put a pillow under your knees.  Only take over-the-counter or prescription medicines as directed by your caregiver. Over-the-counter medicines to reduce pain and inflammation are often the most helpful.Your caregiver may prescribe muscle relaxant drugs.These medicines help dull your pain so you can more quickly return to your normal activities and healthy exercise.  Put ice on the injured area.  Put ice in a plastic bag.  Place a towel between your skin and the bag.  Leave the ice on for 15-20 minutes, 03-04 times a day for the first 2 to 3 days. After that, ice and heat may be alternated to reduce pain and spasms.  Ask your caregiver about trying back exercises and gentle massage. This may be of some benefit.  Avoid feeling anxious or stressed.Stress increases muscle tension and can worsen back pain.It is important to recognize when you are anxious or stressed and learn ways to manage it.Exercise is a great option. SEEK MEDICAL CARE IF:  You have pain that is not relieved with rest or medicine.  You have pain that does not improve in 1 week.  You have new symptoms.  You are generally not feeling well. SEEK   IMMEDIATE MEDICAL CARE IF:   You have pain that radiates from your back into your legs.  You develop new bowel or bladder control problems.  You have unusual weakness or numbness in your arms or legs.  You develop nausea or vomiting.  You develop abdominal pain.  You feel faint. Document Released: 01/31/2005 Document Revised: 08/02/2011 Document Reviewed: 06/04/2013 [employer] Patient Information 2015 [employer]. This information is not intended to replace advice given to you by your health care provider. Make sure you  discuss any questions you have with your health care provider.  

## 2014-03-21 NOTE — ED Provider Notes (Signed)
CSN: [id number]     Arrival date & time 03/21/14  1433 History   First MD Initiated Contact with Patient 03/21/14 1532     Chief Complaint  Patient presents with  . Sickle Cell Pain Crisis     (Consider location/radiation/quality/duration/timing/severity/associated sxs/prior Treatment) Patient is a 24 y.o. male presenting with sickle cell pain. The history is provided by the patient. No language interpreter was used.  Sickle Cell Pain Crisis Location:  Back Severity:  Moderate Onset quality:  Gradual Similar to previous crisis episodes: no   Timing:  Constant Progression:  Worsening Chronicity:  New Context: change in medication   Context: not alcohol consumption   Relieved by:  Nothing Worsened by:  Nothing tried Ineffective treatments:  None tried Associated symptoms: no chest pain and no fever   Risk factors: no frequent admissions for fever, no frequent admissions for pain and no frequent pain crises   Pt denies any chest pain.   Pt complains of back pain only.   Pt is followed by [facility] but is trying to transfer to [facility].   Past Medical History  Diagnosis Date  . Sickle cell anemia    Past Surgical History  Procedure Laterality Date  . Cholecystectomy     History reviewed. No pertinent family history. History  Substance Use Topics  . Smoking status: Never Smoker   . Smokeless tobacco: Never Used  . Alcohol Use: No    Review of Systems  Constitutional: Negative for fever.  Cardiovascular: Negative for chest pain.  All other systems reviewed and are negative.     Allergies  Morphine and related; Toradol; Tramadol; Vancomycin; and Zosyn  Home Medications   Prior to Admission medications   Medication Sig Start Date End Date Taking? Authorizing Provider  docusate sodium 100 MG CAPS Take 100 mg by mouth 2 (two) times daily. 02/12/14   Osa Craver, MD  folic acid (FOLVITE) 1 MG tablet Take 1 mg by mouth daily.    Historical  Provider, MD  hydroxyurea (HYDREA) 500 MG capsule Take 500 mg by mouth 2 (two) times daily. May take with food to minimize GI side effects.    Historical Provider, MD  ibuprofen (ADVIL,MOTRIN) 800 MG tablet Take 1 tablet (800 mg total) by mouth 3 (three) times daily. 02/12/14   Osa Craver, MD  methocarbamol (ROBAXIN) 500 MG tablet Take 1 tablet (500 mg total) by mouth 2 (two) times daily. 10/06/13   Fransico [place], PA-C  oxyCODONE-acetaminophen (PERCOCET/ROXICET) 5-325 MG per tablet Take 1-2 tablets by mouth every 4 (four) hours as needed for moderate pain. 02/12/14   Osa Craver, MD  potassium chloride (K-DUR) 10 MEQ tablet Take 10 mEq by mouth daily.     Historical Provider, MD  promethazine (PHENERGAN) 25 MG tablet Take 25 mg by mouth every 6 (six) hours as needed. For nausea 08/27/13   Historical Provider, MD   BP 119/68 mmHg  Pulse 65  Temp(Src) 98.4 F (36.9 C) (Oral)  Resp 18  SpO2 100% Physical Exam  Constitutional: He is oriented to person, place, and time. He appears well-developed and well-nourished.  HENT:  Head: Normocephalic and atraumatic.  Eyes: Conjunctivae and EOM are normal. Pupils are equal, round, and reactive to light.  Neck: Normal range of motion.  Cardiovascular: Normal rate and normal heart sounds.   Pulmonary/Chest: Effort normal.  Abdominal: He exhibits no distension.  Musculoskeletal: Normal range of motion.  Neurological: He is alert and oriented to person, place,  and time.  Psychiatric: He has a normal mood and affect.  Nursing note and vitals reviewed.   ED Course  Procedures (including critical care time) Labs Review Labs Reviewed  CBC WITH DIFFERENTIAL/PLATELET - Abnormal; Notable for the following:    WBC 10.9 (*)    RBC 3.65 (*)    Hemoglobin 10.9 (*)    HCT 30.1 (*)    MCHC 36.2 (*)    RDW 18.7 (*)    Platelets 432 (*)    All other components within normal limits  RETICULOCYTES - Abnormal; Notable for the following:    Retic Ct  Pct 9.3 (*)    RBC. 3.65 (*)    Retic Count, Manual 339.5 (*)    All other components within normal limits  COMPREHENSIVE METABOLIC PANEL    Imaging Review No results found.   EKG Interpretation None      MDM   Final diagnoses:  Midline low back pain without sciatica    Pt given dilaudid Iv.  Labs obtained.   Vital signs stable.  Hemoglobin is 10.9.   Pt is advised to follow up with his MD.  Pt is out of percocet.   I reviewed records.  I will write pt for 16 tablets.  He is advised to see his primary MD.    Fransico [place], PA-C 03/21/14 [place], PA-C 03/21/14 [place], MD 03/21/14 [phone]

## 2014-03-21 NOTE — ED Notes (Signed)
Pt requested additional pain medication PA notified.

## 2014-03-21 NOTE — ED Notes (Signed)
Pt given an urinal to use

## 2014-03-29 ENCOUNTER — Encounter (HOSPITAL_COMMUNITY): Payer: Self-pay | Admitting: Emergency Medicine

## 2014-03-29 ENCOUNTER — Emergency Department (HOSPITAL_COMMUNITY)
Admission: EM | Admit: 2014-03-29 | Discharge: 2014-03-29 | Disposition: A | Discharge status: Home / Self Care | Payer: Medicaid Other | Source: Home / Self Care | Attending: Emergency Medicine | Admitting: Emergency Medicine

## 2014-03-29 DIAGNOSIS — R17 Unspecified jaundice: Secondary | ICD-10-CM

## 2014-03-29 DIAGNOSIS — D5700 Hb-SS disease with crisis, unspecified: Secondary | ICD-10-CM | POA: Insufficient documentation

## 2014-03-29 DIAGNOSIS — Z79899 Other long term (current) drug therapy: Secondary | ICD-10-CM

## 2014-03-29 LAB — CBC WITH DIFFERENTIAL/PLATELET
Basophils Absolute: 0 10*3/uL (ref 0.0–0.1)
Basophils Relative: 0 % (ref 0–1)
Eosinophils Absolute: 0.1 10*3/uL (ref 0.0–0.7)
Eosinophils Relative: 1 % (ref 0–5)
HCT: 27.3 % — ABNORMAL LOW (ref 39.0–52.0)
Hemoglobin: 9.4 g/dL — ABNORMAL LOW (ref 13.0–17.0)
Lymphocytes Relative: 20 % (ref 12–46)
Lymphs Abs: 2.2 10*3/uL (ref 0.7–4.0)
MCH: 29.2 pg (ref 26.0–34.0)
MCHC: 34.4 g/dL (ref 30.0–36.0)
MCV: 84.8 fL (ref 78.0–100.0)
Monocytes Absolute: 1 10*3/uL (ref 0.1–1.0)
Monocytes Relative: 9 % (ref 3–12)
Neutro Abs: 7.7 10*3/uL (ref 1.7–7.7)
Neutrophils Relative %: 70 % (ref 43–77)
Platelets: 511 10*3/uL — ABNORMAL HIGH (ref 150–400)
RBC: 3.22 MIL/uL — ABNORMAL LOW (ref 4.22–5.81)
RDW: 21.3 % — ABNORMAL HIGH (ref 11.5–15.5)
WBC: 11 10*3/uL — ABNORMAL HIGH (ref 4.0–10.5)

## 2014-03-29 LAB — COMPREHENSIVE METABOLIC PANEL
ALT: 12 U/L (ref 0–53)
AST: 28 U/L (ref 0–37)
Albumin: 4.2 g/dL (ref 3.5–5.2)
Alkaline Phosphatase: 73 U/L (ref 39–117)
Anion gap: 8 (ref 5–15)
BUN: <5 mg/dL — ABNORMAL LOW (ref 6–23)
CO2: 24 mmol/L (ref 19–32)
Calcium: 9.1 mg/dL (ref 8.4–10.5)
Chloride: 107 mmol/L (ref 96–112)
Creatinine, Ser: 0.58 mg/dL (ref 0.50–1.35)
GFR calc Af Amer: >90 mL/min (ref >90)
GFR calc non Af Amer: >90 mL/min (ref >90)
Glucose, Bld: 98 mg/dL (ref 70–99)
Potassium: 3.4 mmol/L — ABNORMAL LOW (ref 3.5–5.1)
Sodium: 139 mmol/L (ref 135–145)
Total Bilirubin: 3.8 mg/dL — ABNORMAL HIGH (ref 0.3–1.2)
Total Protein: 7.6 g/dL (ref 6.0–8.3)

## 2014-03-29 LAB — RETICULOCYTES
RBC.: 3.22 MIL/uL — ABNORMAL LOW (ref 4.22–5.81)
Retic Count, Absolute: 479.8 10*3/uL — ABNORMAL HIGH (ref 19.0–186.0)
Retic Ct Pct: 14.9 % — ABNORMAL HIGH (ref 0.4–3.1)

## 2014-03-29 MED ORDER — SODIUM CHLORIDE 0.9 % IV SOLN: INTRAVENOUS | Status: DC

## 2014-03-29 MED ORDER — SODIUM CHLORIDE 0.9 % IV BOLUS (SEPSIS)
1000.0000 mL | Freq: Once | INTRAVENOUS | Status: AC
  Administered 2014-03-29: 1000 mL via INTRAVENOUS

## 2014-03-29 MED ORDER — HYDROMORPHONE HCL 1 MG/ML IJ SOLN
2.0000 mg | INTRAMUSCULAR | Status: DC | PRN
  Administered 2014-03-29 (×2): 2 mg via INTRAVENOUS
  Filled 2014-03-29 (×2): qty 2

## 2014-03-29 MED ORDER — DIPHENHYDRAMINE HCL 50 MG/ML IJ SOLN
25.0000 mg | Freq: Once | INTRAMUSCULAR | Status: AC
  Administered 2014-03-29: 25 mg via INTRAVENOUS
  Filled 2014-03-29: qty 1

## 2014-03-29 MED ORDER — ONDANSETRON HCL 4 MG/2ML IJ SOLN
4.0000 mg | Freq: Once | INTRAMUSCULAR | Status: AC
Start: 2014-03-29 — End: 2014-03-29
  Administered 2014-03-29: 4 mg via INTRAVENOUS
  Filled 2014-03-29: qty 2

## 2014-03-29 NOTE — ED Provider Notes (Signed)
CSN: [id number]     Arrival date & time 03/29/14  1557 History   First MD Initiated Contact with Patient 03/29/14 1629     Chief Complaint  Patient presents with  . Sickle Cell Pain Crisis  . Back Pain     (Consider location/radiation/quality/duration/timing/severity/associated sxs/prior Treatment) HPI Comments: Sx similar to prior attacks, recents visits to ED for same--has not established PCP yet  Patient is a 24 y.o. male presenting with sickle cell pain and back pain. The history is provided by the patient.  Sickle Cell Pain Crisis Location:  Back and chest Severity:  Moderate Onset quality:  Sudden Duration:  1 day Similar to previous crisis episodes: yes   Timing:  Constant Progression:  Worsening Chronicity:  Recurrent Sickle cell genotype:  SS History of pulmonary emboli: no   Context: not dehydration and not infection   Relieved by:  Nothing Worsened by:  Nothing tried Ineffective treatments:  None tried Associated symptoms: chest pain   Associated symptoms: no congestion, no cough, no fever, no priapism, no vomiting and no wheezing   Back Pain Associated symptoms: chest pain   Associated symptoms: no fever     Past Medical History  Diagnosis Date  . Sickle cell anemia    Past Surgical History  Procedure Laterality Date  . Cholecystectomy     No family history on file. History  Substance Use Topics  . Smoking status: Never Smoker   . Smokeless tobacco: Never Used  . Alcohol Use: No    Review of Systems  Constitutional: Negative for fever.  HENT: Negative for congestion.   Respiratory: Negative for cough and wheezing.   Cardiovascular: Positive for chest pain.  Gastrointestinal: Negative for vomiting.  Musculoskeletal: Positive for back pain.  All other systems reviewed and are negative.     Allergies  Morphine and related; Toradol; Tramadol; Vancomycin; and Zosyn  Home Medications   Prior to Admission medications   Medication Sig Start  Date End Date Taking? Authorizing Provider  docusate sodium 100 MG CAPS Take 100 mg by mouth 2 (two) times daily. 02/12/14   Osa Craver, MD  folic acid (FOLVITE) 1 MG tablet Take 1 mg by mouth daily.    Historical Provider, MD  hydroxyurea (HYDREA) 500 MG capsule Take 500 mg by mouth 2 (two) times daily. May take with food to minimize GI side effects.    Historical Provider, MD  ibuprofen (ADVIL,MOTRIN) 800 MG tablet Take 1 tablet (800 mg total) by mouth 3 (three) times daily. 02/12/14   Osa Craver, MD  methocarbamol (ROBAXIN) 500 MG tablet Take 1 tablet (500 mg total) by mouth 2 (two) times daily. 10/06/13   Fransico [place], PA-C  oxyCODONE-acetaminophen (PERCOCET/ROXICET) 5-325 MG per tablet Take 1-2 tablets by mouth every 4 (four) hours as needed for moderate pain. 03/21/14   Fransico [place], PA-C  potassium chloride (K-DUR) 10 MEQ tablet Take 10 mEq by mouth daily.     Historical Provider, MD  promethazine (PHENERGAN) 25 MG tablet Take 25 mg by mouth every 6 (six) hours as needed. For nausea 08/27/13   Historical Provider, MD   BP 121/68 mmHg  Pulse 72  Temp(Src) 98.7 F (37.1 C) (Oral)  Resp 17  Ht 6\' 2"  (1.88 m)  Wt 130 lb (58.968 kg)  BMI 16.68 kg/m2  SpO2 98% Physical Exam  Constitutional: He is oriented to person, place, and time. He appears well-developed and well-nourished.  Non-toxic appearance. No distress.  HENT:  Head: Normocephalic and  atraumatic.  Eyes: Conjunctivae, EOM and lids are normal. Pupils are equal, round, and reactive to light. Scleral icterus is present.  Neck: Normal range of motion. Neck supple. No tracheal deviation present. No thyroid mass present.  Cardiovascular: Normal rate, regular rhythm and normal heart sounds.  Exam reveals no gallop.   No murmur heard. Pulmonary/Chest: Effort normal and breath sounds normal. No stridor. No respiratory distress. He has no decreased breath sounds. He has no wheezes. He has no rhonchi. He has no rales.   Abdominal: Soft. Normal appearance and bowel sounds are normal. He exhibits no distension. There is no tenderness. There is no rebound and no CVA tenderness.  Musculoskeletal: Normal range of motion. He exhibits no edema or tenderness.  Neurological: He is alert and oriented to person, place, and time. He has normal strength. No cranial nerve deficit or sensory deficit. GCS eye subscore is 4. GCS verbal subscore is 5. GCS motor subscore is 6.  Skin: Skin is warm and dry. No abrasion and no rash noted.  Psychiatric: He has a normal mood and affect. His speech is normal and behavior is normal.  Nursing note and vitals reviewed.   ED Course  Procedures (including critical care time) Labs Review Labs Reviewed  CBC WITH DIFFERENTIAL/PLATELET  COMPREHENSIVE METABOLIC PANEL  RETICULOCYTES    Imaging Review No results found.   EKG Interpretation   Date/Time:  Saturday March 29 2014 16:27:33 EST Ventricular Rate:  67 PR Interval:  146 QRS Duration: 99 QT Interval:  385 QTC Calculation: 406 R Axis:   -14 Text Interpretation:  Sinus rhythm Consider left atrial enlargement  Probable left ventricular hypertrophy No significant change since last  tracing Confirmed by Jaceyon Strole  MD, Tafari Humiston ([id number]) on 03/29/2014 4:30:34 PM      MDM   Final diagnoses:  None    Patient given pain medication and IV fluids and feels better. Has been encouraged to follow-up with a PCP   Leota Jacobsen, MD 03/29/14 2003

## 2014-03-29 NOTE — ED Notes (Signed)
Dilaudid .5mg  went in IV and then infiltrated.  Per MD given Dilaudid 1.5mg  IM.

## 2014-03-29 NOTE — ED Notes (Signed)
Pt c/o sickle cell pain to back onset yesterday. Pt denies recent injuries or heavy lifting.

## 2014-03-29 NOTE — Discharge Instructions (Signed)
Sickle Cell Anemia, Adult °Sickle cell anemia is a condition in which red blood cells have an abnormal "sickle" shape. This abnormal shape shortens the cells' life span, which results in a lower than normal concentration of red blood cells in the blood. The sickle shape also causes the cells to clump together and block free blood flow through the blood vessels. As a result, the tissues and organs of the body do not receive enough oxygen. Sickle cell anemia causes organ damage and pain and increases the risk of infection. °CAUSES  °Sickle cell anemia is a genetic disorder. Those who receive two copies of the gene have the condition, and those who receive one copy have the trait. °RISK FACTORS °The sickle cell gene is most common in people whose families originated in [place]. Other areas of the globe where sickle cell trait occurs include the [place], [place] and [place], the [place], and the [place].  °SIGNS AND SYMPTOMS °· Pain, especially in the extremities, back, chest, or abdomen (common). The pain may start suddenly or may develop following an illness, especially if there is dehydration. Pain can also occur due to overexertion or exposure to extreme temperature changes. °· Frequent severe bacterial infections, especially certain types of pneumonia and meningitis. °· Pain and swelling in the hands and feet. °· Decreased activity.   °· Loss of appetite.   °· Change in behavior. °· Headaches. °· Seizures. °· Shortness of breath or difficulty breathing. °· Vision changes. °· Skin ulcers. °Those with the trait may not have symptoms or they may have mild symptoms.  °DIAGNOSIS  °Sickle cell anemia is diagnosed with blood tests that demonstrate the genetic trait. It is often diagnosed during the newborn period, due to mandatory testing nationwide. A variety of blood tests, X-rays, CT scans, MRI scans, ultrasounds, and lung function tests may also be done to monitor the condition. °TREATMENT  °Sickle  cell anemia may be treated with: °· Medicines. You may be given pain medicines, antibiotic medicines (to treat and prevent infections) or medicines to increase the production of certain types of hemoglobin. °· Fluids. °· Oxygen. °· Blood transfusions. °HOME CARE INSTRUCTIONS  °· Drink enough fluid to keep your urine clear or pale yellow. Increase your fluid intake in hot weather and during exercise. °· Do not smoke. Smoking lowers oxygen levels in the blood.   °· Only take over-the-counter or prescription medicines for pain, fever, or discomfort as directed by your health care provider. °· Take antibiotics as directed by your health care provider. Make sure you finish them it even if you start to feel better.   °· Take supplements as directed by your health care provider.   °· Consider wearing a medical alert bracelet. This tells anyone caring for you in an emergency of your condition.   °· When traveling, keep your medical information, health care provider's names, and the medicines you take with you at all times.   °· If you develop a fever, do not take medicines to reduce the fever right away. This could cover up a problem that is developing. Notify your health care provider. °· Keep all follow-up appointments with your health care provider. Sickle cell anemia requires regular medical care. °SEEK MEDICAL CARE IF: ° You have a fever. °SEEK IMMEDIATE MEDICAL CARE IF:  °· You feel dizzy or faint.   °· You have new abdominal pain, especially on the left side near the stomach area.   °· You develop a persistent, often uncomfortable and painful penile erection (priapism). If this is not treated immediately it   will lead to impotence.   °· You have numbness your arms or legs or you have a hard time moving them.   °· You have a hard time with speech.   °· You have a fever or persistent symptoms for more than 2-3 days.   °· You have a fever and your symptoms suddenly get worse.   °· You have signs or symptoms of infection.  These include:   °¨ Chills.   °¨ Abnormal tiredness (lethargy).   °¨ Irritability.   °¨ Poor eating.   °¨ Vomiting.   °· You develop pain that is not helped with medicine.   °· You develop shortness of breath. °· You have pain in your chest.   °· You are coughing up pus-like or bloody sputum.   °· You develop a stiff neck. °· Your feet or hands swell or have pain. °· Your abdomen appears bloated. °· You develop joint pain. °MAKE SURE YOU: °· Understand these instructions. °Document Released: 05/11/2005 Document Revised: 06/17/2013 Document Reviewed: 09/12/2012 °Exit[employer]® Patient Information ©2015 [employer]. This information is not intended to replace advice given to you by your health care provider. Make sure you discuss any questions you have with your health care provider. ° °

## 2014-03-30 ENCOUNTER — Inpatient Hospital Stay (HOSPITAL_COMMUNITY)
Admission: EM | Admit: 2014-03-30 | Discharge: 2014-04-09 | DRG: 811 | Disposition: A | Discharge status: Home / Self Care | Payer: Medicaid Other | Attending: Internal Medicine | Admitting: Internal Medicine

## 2014-03-30 ENCOUNTER — Emergency Department (HOSPITAL_COMMUNITY): Payer: Medicaid Other

## 2014-03-30 ENCOUNTER — Encounter (HOSPITAL_COMMUNITY): Payer: Self-pay | Admitting: Emergency Medicine

## 2014-03-30 DIAGNOSIS — Z79899 Other long term (current) drug therapy: Secondary | ICD-10-CM

## 2014-03-30 DIAGNOSIS — D571 Sickle-cell disease without crisis: Secondary | ICD-10-CM | POA: Diagnosis present

## 2014-03-30 DIAGNOSIS — R0902 Hypoxemia: Secondary | ICD-10-CM | POA: Diagnosis not present

## 2014-03-30 DIAGNOSIS — D5700 Hb-SS disease with crisis, unspecified: Principal | ICD-10-CM | POA: Diagnosis present

## 2014-03-30 DIAGNOSIS — D72829 Elevated white blood cell count, unspecified: Secondary | ICD-10-CM

## 2014-03-30 DIAGNOSIS — Z9119 Patient's noncompliance with other medical treatment and regimen: Secondary | ICD-10-CM | POA: Diagnosis present

## 2014-03-30 DIAGNOSIS — E43 Unspecified severe protein-calorie malnutrition: Secondary | ICD-10-CM | POA: Diagnosis present

## 2014-03-30 DIAGNOSIS — M25512 Pain in left shoulder: Secondary | ICD-10-CM | POA: Diagnosis not present

## 2014-03-30 DIAGNOSIS — Y95 Nosocomial condition: Secondary | ICD-10-CM | POA: Diagnosis present

## 2014-03-30 DIAGNOSIS — M90512 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, left shoulder: Secondary | ICD-10-CM | POA: Diagnosis present

## 2014-03-30 DIAGNOSIS — Z9114 Patient's other noncompliance with medication regimen: Secondary | ICD-10-CM | POA: Diagnosis present

## 2014-03-30 DIAGNOSIS — E559 Vitamin D deficiency, unspecified: Secondary | ICD-10-CM | POA: Diagnosis present

## 2014-03-30 DIAGNOSIS — M879 Osteonecrosis, unspecified: Secondary | ICD-10-CM | POA: Diagnosis present

## 2014-03-30 DIAGNOSIS — J189 Pneumonia, unspecified organism: Secondary | ICD-10-CM | POA: Diagnosis not present

## 2014-03-30 DIAGNOSIS — F332 Major depressive disorder, recurrent severe without psychotic features: Secondary | ICD-10-CM | POA: Diagnosis present

## 2014-03-30 DIAGNOSIS — R000 Tachycardia, unspecified: Secondary | ICD-10-CM

## 2014-03-30 DIAGNOSIS — R17 Unspecified jaundice: Secondary | ICD-10-CM | POA: Diagnosis present

## 2014-03-30 DIAGNOSIS — F419 Anxiety disorder, unspecified: Secondary | ICD-10-CM | POA: Diagnosis present

## 2014-03-30 DIAGNOSIS — E876 Hypokalemia: Secondary | ICD-10-CM | POA: Diagnosis present

## 2014-03-30 DIAGNOSIS — K5900 Constipation, unspecified: Secondary | ICD-10-CM | POA: Diagnosis present

## 2014-03-30 DIAGNOSIS — D473 Essential (hemorrhagic) thrombocythemia: Secondary | ICD-10-CM | POA: Diagnosis present

## 2014-03-30 DIAGNOSIS — D638 Anemia in other chronic diseases classified elsewhere: Secondary | ICD-10-CM | POA: Diagnosis present

## 2014-03-30 DIAGNOSIS — R509 Fever, unspecified: Secondary | ICD-10-CM

## 2014-03-30 DIAGNOSIS — Z8739 Personal history of other diseases of the musculoskeletal system and connective tissue: Secondary | ICD-10-CM

## 2014-03-30 DIAGNOSIS — R52 Pain, unspecified: Secondary | ICD-10-CM

## 2014-03-30 DIAGNOSIS — Z681 Body mass index (BMI) 19 or less, adult: Secondary | ICD-10-CM

## 2014-03-30 DIAGNOSIS — G4700 Insomnia, unspecified: Secondary | ICD-10-CM | POA: Diagnosis present

## 2014-03-30 HISTORY — DX: Vitamin D deficiency, unspecified: E55.9

## 2014-03-30 HISTORY — DX: 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, left shoulder: M90.512

## 2014-03-30 LAB — RAPID URINE DRUG SCREEN, HOSP PERFORMED
Amphetamines: NOT DETECTED
Barbiturates: NOT DETECTED
Benzodiazepines: NOT DETECTED
Cocaine: NOT DETECTED
Opiates: POSITIVE — AB
Tetrahydrocannabinol: POSITIVE — AB

## 2014-03-30 LAB — RETICULOCYTES
RBC.: 3.07 MIL/uL — ABNORMAL LOW (ref 4.22–5.81)
Retic Count, Absolute: 478.9 10*3/uL — ABNORMAL HIGH (ref 19.0–186.0)
Retic Ct Pct: 15.6 % — ABNORMAL HIGH (ref 0.4–3.1)

## 2014-03-30 LAB — COMPREHENSIVE METABOLIC PANEL
ALT: 12 U/L (ref 0–53)
AST: 30 U/L (ref 0–37)
Albumin: 4.2 g/dL (ref 3.5–5.2)
Alkaline Phosphatase: 84 U/L (ref 39–117)
Anion gap: 5 (ref 5–15)
BUN: 8 mg/dL (ref 6–23)
CO2: 28 mmol/L (ref 19–32)
Calcium: 8.9 mg/dL (ref 8.4–10.5)
Chloride: 104 mmol/L (ref 96–112)
Creatinine, Ser: 0.58 mg/dL (ref 0.50–1.35)
GFR calc Af Amer: >90 mL/min (ref >90)
GFR calc non Af Amer: >90 mL/min (ref >90)
Glucose, Bld: 91 mg/dL (ref 70–99)
Potassium: 3.5 mmol/L (ref 3.5–5.1)
Sodium: 137 mmol/L (ref 135–145)
Total Bilirubin: 5.7 mg/dL — ABNORMAL HIGH (ref 0.3–1.2)
Total Protein: 7.7 g/dL (ref 6.0–8.3)

## 2014-03-30 LAB — CBC WITH DIFFERENTIAL/PLATELET
Basophils Absolute: 0.1 10*3/uL (ref 0.0–0.1)
Basophils Relative: 1 % (ref 0–1)
Eosinophils Absolute: 0.1 10*3/uL (ref 0.0–0.7)
Eosinophils Relative: 1 % (ref 0–5)
HCT: 27 % — ABNORMAL LOW (ref 39.0–52.0)
Hemoglobin: 9.4 g/dL — ABNORMAL LOW (ref 13.0–17.0)
Lymphocytes Relative: 21 % (ref 12–46)
Lymphs Abs: 2.8 10*3/uL (ref 0.7–4.0)
MCH: 30.6 pg (ref 26.0–34.0)
MCHC: 34.8 g/dL (ref 30.0–36.0)
MCV: 87.9 fL (ref 78.0–100.0)
Monocytes Absolute: 1.3 10*3/uL — ABNORMAL HIGH (ref 0.1–1.0)
Monocytes Relative: 10 % (ref 3–12)
Neutro Abs: 9.1 10*3/uL — ABNORMAL HIGH (ref 1.7–7.7)
Neutrophils Relative %: 67 % (ref 43–77)
Platelets: 477 10*3/uL — ABNORMAL HIGH (ref 150–400)
RBC: 3.07 MIL/uL — ABNORMAL LOW (ref 4.22–5.81)
RDW: 22.3 % — ABNORMAL HIGH (ref 11.5–15.5)
WBC: 13.4 10*3/uL — ABNORMAL HIGH (ref 4.0–10.5)

## 2014-03-30 LAB — URINALYSIS, ROUTINE W REFLEX MICROSCOPIC
Bilirubin Urine: NEGATIVE
Glucose, UA: NEGATIVE mg/dL
Ketones, ur: NEGATIVE mg/dL
Leukocytes, UA: NEGATIVE
Nitrite: NEGATIVE
Protein, ur: NEGATIVE mg/dL
Specific Gravity, Urine: 1.012 (ref 1.005–1.030)
Urobilinogen, UA: 4 mg/dL — ABNORMAL HIGH (ref 0.0–1.0)
pH: 6 (ref 5.0–8.0)

## 2014-03-30 LAB — TROPONIN I: Troponin I: <0.03 ng/mL (ref <0.031)

## 2014-03-30 LAB — URINE MICROSCOPIC-ADD ON

## 2014-03-30 MED ORDER — FOLIC ACID 1 MG PO TABS
1.0000 mg | ORAL_TABLET | Freq: Every day | ORAL | Status: DC
  Administered 2014-03-31 – 2014-04-09 (×10): 1 mg via ORAL
  Filled 2014-03-30 (×10): qty 1

## 2014-03-30 MED ORDER — NALOXONE HCL 0.4 MG/ML IJ SOLN: 0.4000 mg | INTRAMUSCULAR | Status: DC | PRN

## 2014-03-30 MED ORDER — ACETAMINOPHEN 650 MG RE SUPP: 650.0000 mg | RECTAL | Status: DC | PRN

## 2014-03-30 MED ORDER — CETYLPYRIDINIUM CHLORIDE 0.05 % MT LIQD
7.0000 mL | Freq: Two times a day (BID) | OROMUCOSAL | Status: DC
  Administered 2014-04-03 – 2014-04-09 (×10): 7 mL via OROMUCOSAL

## 2014-03-30 MED ORDER — ENOXAPARIN SODIUM 40 MG/0.4ML ~~LOC~~ SOLN
40.0000 mg | SUBCUTANEOUS | Status: DC
  Administered 2014-03-30 – 2014-04-09 (×9): 40 mg via SUBCUTANEOUS
  Filled 2014-03-30 (×11): qty 0.4

## 2014-03-30 MED ORDER — ACETAMINOPHEN 325 MG PO TABS
650.0000 mg | ORAL_TABLET | ORAL | Status: DC | PRN
  Administered 2014-03-30 – 2014-04-01 (×4): 650 mg via ORAL
  Filled 2014-03-30 (×4): qty 2

## 2014-03-30 MED ORDER — HYDROMORPHONE 2 MG/ML HIGH CONCENTRATION IV PCA SOLN
INTRAVENOUS | Status: DC
Start: 2014-03-30 — End: 2014-03-31
  Administered 2014-03-30: 7.5 mg via INTRAVENOUS
  Administered 2014-03-30: 12:00:00 via INTRAVENOUS
  Administered 2014-03-30: 1 mg via INTRAVENOUS
  Administered 2014-03-30: 5 mg via INTRAVENOUS
  Administered 2014-03-31: 8 mg via INTRAVENOUS
  Filled 2014-03-30: qty 25

## 2014-03-30 MED ORDER — ONDANSETRON HCL 4 MG/2ML IJ SOLN: 4.0000 mg | Freq: Three times a day (TID) | INTRAMUSCULAR | Status: DC | PRN

## 2014-03-30 MED ORDER — SODIUM CHLORIDE 0.9 % IV SOLN
INTRAVENOUS | Status: DC
Start: 2014-03-30 — End: 2014-03-30
  Administered 2014-03-30: 11:00:00 via INTRAVENOUS

## 2014-03-30 MED ORDER — POLYETHYLENE GLYCOL 3350 17 G PO PACK
17.0000 g | PACK | Freq: Every day | ORAL | Status: DC | PRN
  Administered 2014-04-08: 17 g via ORAL
  Filled 2014-03-30: qty 1

## 2014-03-30 MED ORDER — ONDANSETRON HCL 4 MG/2ML IJ SOLN
4.0000 mg | INTRAMUSCULAR | Status: DC | PRN
  Administered 2014-03-31: 4 mg via INTRAVENOUS
  Filled 2014-03-30: qty 2

## 2014-03-30 MED ORDER — ONDANSETRON HCL 4 MG/2ML IJ SOLN
4.0000 mg | Freq: Once | INTRAMUSCULAR | Status: AC
Start: 2014-03-30 — End: 2014-03-30
  Administered 2014-03-30: 4 mg via INTRAVENOUS
  Filled 2014-03-30: qty 2

## 2014-03-30 MED ORDER — DIPHENHYDRAMINE HCL 50 MG/ML IJ SOLN
12.5000 mg | Freq: Four times a day (QID) | INTRAMUSCULAR | Status: DC | PRN
  Filled 2014-03-30: qty 0.25

## 2014-03-30 MED ORDER — HYDROMORPHONE HCL 1 MG/ML IJ SOLN
1.0000 mg | Freq: Once | INTRAMUSCULAR | Status: AC
  Administered 2014-03-30: 1 mg via INTRAVENOUS

## 2014-03-30 MED ORDER — SODIUM CHLORIDE 0.45 % IV SOLN
INTRAVENOUS | Status: DC
  Administered 2014-03-30 (×2): via INTRAVENOUS

## 2014-03-30 MED ORDER — DIPHENHYDRAMINE HCL 12.5 MG/5ML PO ELIX: 12.5000 mg | ORAL_SOLUTION | Freq: Four times a day (QID) | ORAL | Status: DC | PRN

## 2014-03-30 MED ORDER — ENSURE COMPLETE PO LIQD
237.0000 mL | Freq: Two times a day (BID) | ORAL | Status: DC
  Administered 2014-03-31 – 2014-04-03 (×5): 237 mL via ORAL

## 2014-03-30 MED ORDER — HYDROMORPHONE HCL 2 MG/ML IJ SOLN
2.0000 mg | Freq: Once | INTRAMUSCULAR | Status: AC
  Administered 2014-03-30: 2 mg via INTRAVENOUS
  Filled 2014-03-30: qty 1

## 2014-03-30 MED ORDER — SODIUM CHLORIDE 0.9 % IV BOLUS (SEPSIS)
1000.0000 mL | Freq: Once | INTRAVENOUS | Status: AC
  Administered 2014-03-30: 1000 mL via INTRAVENOUS

## 2014-03-30 MED ORDER — HYDROMORPHONE HCL 1 MG/ML IJ SOLN: 1.0000 mg | INTRAMUSCULAR | Status: DC | PRN

## 2014-03-30 MED ORDER — PNEUMOCOCCAL VAC POLYVALENT 25 MCG/0.5ML IJ INJ
0.5000 mL | INJECTION | INTRAMUSCULAR | Status: DC
  Filled 2014-03-30 (×2): qty 0.5

## 2014-03-30 MED ORDER — ONDANSETRON HCL 4 MG PO TABS: 4.0000 mg | ORAL_TABLET | ORAL | Status: DC | PRN

## 2014-03-30 MED ORDER — INFLUENZA VAC SPLIT QUAD 0.5 ML IM SUSY
0.5000 mL | PREFILLED_SYRINGE | INTRAMUSCULAR | Status: AC
Start: 2014-03-31 — End: 2014-04-01
  Administered 2014-04-01: 0.5 mL via INTRAMUSCULAR
  Filled 2014-03-30 (×2): qty 0.5

## 2014-03-30 MED ORDER — SODIUM CHLORIDE 0.9 % IJ SOLN: 9.0000 mL | INTRAMUSCULAR | Status: DC | PRN

## 2014-03-30 MED ORDER — DOCUSATE SODIUM 100 MG PO CAPS
100.0000 mg | ORAL_CAPSULE | Freq: Two times a day (BID) | ORAL | Status: DC
  Administered 2014-03-30 – 2014-04-01 (×3): 100 mg via ORAL
  Filled 2014-03-30 (×3): qty 1

## 2014-03-30 NOTE — ED Notes (Signed)
Pt. Unable to urinate at this time. Will collect specimen. Nurse was notified.

## 2014-03-30 NOTE — H&P (Signed)
[facility] History and Physical  Dameion Briles QQP:[id number] DOB: [birth year] DOA: 03/30/2014   PCP: [facility]: He wants to establish with sickle cell clinic  Chief Complaint: Back pain, chest pain, left hip pain  HPI: Timothy Marks is a 24 y.o. male with a past medical history of sickle cell disease, anemia, hyperbilirubinemia, who has had multiple visits to the emergency department, both here in [employer], as well as in [place]. He comes in with the above-mentioned complaints. This has been ongoing for 2 days. He was seen in the emergency department last night and was treated with pain medications. He was discharged in satisfactory condition. But the pain recurred this morning. He decided to come back. Pain is sharp pain 9 out of 10 in intensity and feels like his usual sickle cell pain. He denies any fever, chills, no diarrhea, no nausea, vomiting. Denies any falls or injuries recently. Urinating without difficulties. Is able to ambulate.  Home Medications: Prior to Admission medications   Medication Sig Start Date End Date Taking? Authorizing Provider  docusate sodium 100 MG CAPS Take 100 mg by mouth 2 (two) times daily. 02/12/14  Yes Alexa Marvel Plan, MD  folic acid (FOLVITE) 1 MG tablet Take 1 mg by mouth daily.   Yes Historical Provider, MD  hydroxyurea (HYDREA) 500 MG capsule Take 500 mg by mouth 2 (two) times daily. May take with food to minimize GI side effects.   Yes Historical Provider, MD  ibuprofen (ADVIL,MOTRIN) 800 MG tablet Take 1 tablet (800 mg total) by mouth 3 (three) times daily. 02/12/14  Yes Alexa Marvel Plan, MD  oxyCODONE-acetaminophen (PERCOCET/ROXICET) 5-325 MG per tablet Take 1-2 tablets by mouth every 4 (four) hours as needed for moderate pain. 03/21/14  Yes Hollace Kinnier Sofia, PA-C  promethazine (PHENERGAN) 25 MG tablet Take 25 mg by mouth every 6 (six) hours as needed for nausea. For nausea 08/27/13  Yes Historical Provider, MD    methocarbamol (ROBAXIN) 500 MG tablet Take 1 tablet (500 mg total) by mouth 2 (two) times daily. Patient not taking: Reported on 03/29/2014 10/06/13   Fransico [place], PA-C    Allergies:  Allergies  Allergen Reactions  . Morphine And Related Hives  . Toradol [Ketorolac Tromethamine] Itching  . Tramadol Hives  . Vancomycin Itching  . Zosyn [Piperacillin Sod-Tazobactam So] Itching and Rash    Past Medical History: Past Medical History  Diagnosis Date  . Sickle cell anemia     Past Surgical History  Procedure Laterality Date  . Cholecystectomy      Social History: He lives in [place] with his girlfriend. Otherwise, he is from [facility]. Denies any alcohol use and liquor use. No smoking. Independent with daily activities.  Family History: He mentions a history of sickle cell disease, but otherwise did not specify.  Review of Systems - History obtained from the patient General ROS: positive for  - fatigue Psychological ROS: positive for - anxiety Ophthalmic ROS: negative ENT ROS: negative Allergy and Immunology ROS: negative Hematological and Lymphatic ROS: negative Endocrine ROS: negative Respiratory ROS: no cough, shortness of breath, or wheezing Cardiovascular ROS: as in hpi Gastrointestinal ROS: no abdominal pain, change in bowel habits, or black or bloody stools Genito-Urinary ROS: no dysuria, trouble voiding, or hematuria Musculoskeletal ROS: as in hpi Neurological ROS: no TIA or stroke symptoms Dermatological ROS: negative  Physical Examination  Filed Vitals:   03/30/14 0746 03/30/14 1034 03/30/14 1112  BP: 156/81 134/68 148/75  Pulse: 78 80 82  Temp:  98.1 F (36.7 C)    TempSrc: Oral    Resp: _0 SpO2: 98% 94% 97%    BP 148/75 mmHg  Pulse 82  Temp(Src) 98.1 F (36.7 C) (Oral)  Resp 14  SpO2 97%  General appearance: alert, cooperative, appears stated age and no distress Head: Normocephalic, without obvious abnormality, atraumatic Eyes:  conjunctivae/corneas clear. PERRL, EOM's intact.  Throat: lips, mucosa, and tongue normal; teeth and gums normal Neck: no adenopathy, no carotid bruit, no JVD, supple, symmetrical, trachea midline and thyroid not enlarged, symmetric, no tenderness/mass/nodules Back: Tender to palpation diffusely. No obvious lesions. Resp: clear to auscultation bilaterally Chest wall: Tender to palpation in the retrosternal area Cardio: regular rate and rhythm, S1, S2 normal, no murmur, click, rub or gallop GI: soft, non-tender; bowel sounds normal; no masses,  no organomegaly Extremities: He was able to left. His left leg off the bed with pain. Able to mobilize the left hip. Pulses: 2+ and symmetric Skin: Skin color, texture, turgor normal. No rashes or lesions Lymph nodes: Cervical, supraclavicular, and axillary nodes normal. Neurologic: Alert and oriented 3. Cranial nerves II-12 intact. Motor strength bilaterally except for the left lower leg, which is limited due to pain.  Laboratory Data: Results for orders placed or performed during the hospital encounter of 03/30/14 (from the past 48 hour(s))  CBC with Differential/Platelet     Status: Abnormal   Collection Time: 03/30/14  8:18 AM  Result Value Ref Range   WBC 13.4 (H) 4.0 - 10.5 K/uL    Comment: RARE NRBCs   RBC 3.07 (L) 4.22 - 5.81 MIL/uL   Hemoglobin 9.4 (L) 13.0 - 17.0 g/dL   HCT 27.0 (L) 39.0 - 52.0 %   MCV 87.9 78.0 - 100.0 fL   MCH 30.6 26.0 - 34.0 pg   MCHC 34.8 30.0 - 36.0 g/dL   RDW 22.3 (H) 11.5 - 15.5 %   Platelets 477 (H) 150 - 400 K/uL   Neutrophils Relative % 67 43 - 77 %   Lymphocytes Relative 21 12 - 46 %   Monocytes Relative 10 3 - 12 %   Eosinophils Relative 1 0 - 5 %   Basophils Relative 1 0 - 1 %   Neutro Abs 9.1 (H) 1.7 - 7.7 K/uL   Lymphs Abs 2.8 0.7 - 4.0 K/uL   Monocytes Absolute 1.3 (H) 0.1 - 1.0 K/uL   Eosinophils Absolute 0.1 0.0 - 0.7 K/uL   Basophils Absolute 0.1 0.0 - 0.1 K/uL   RBC Morphology SICKLE CELLS      Comment: ELLIPTOCYTES TARGET CELLS MARKED POLYCHROMASIA PAPPENHEIMER BODIES CRENATED RBCs   Reticulocytes     Status: Abnormal   Collection Time: 03/30/14  8:18 AM  Result Value Ref Range   Retic Ct Pct 15.6 (H) 0.4 - 3.1 %   RBC. 3.07 (L) 4.22 - 5.81 MIL/uL   Retic Count, Manual 478.9 (H) 19.0 - 186.0 K/uL  Comprehensive metabolic panel     Status: Abnormal   Collection Time: 03/30/14  8:18 AM  Result Value Ref Range   Sodium 137 135 - 145 mmol/L   Potassium 3.5 3.5 - 5.1 mmol/L   Chloride 104 96 - 112 mmol/L   CO2 28 19 - 32 mmol/L   Glucose, Bld 91 70 - 99 mg/dL   BUN 8 6 - 23 mg/dL   Creatinine, Ser 0.58 0.50 - 1.35 mg/dL   Calcium 8.9 8.4 - 10.5 mg/dL   Total Protein 7.7 6.0 -  8.3 g/dL   Albumin 4.2 3.5 - 5.2 g/dL   AST 30 0 - 37 U/L   ALT 12 0 - 53 U/L   Alkaline Phosphatase 84 39 - 117 U/L   Total Bilirubin 5.7 (H) 0.3 - 1.2 mg/dL   GFR calc non Af Amer >90 >90 mL/min   GFR calc Af Amer >90 >90 mL/min    Comment: (NOTE) The eGFR has been calculated using the CKD EPI equation. This calculation has not been validated in all clinical situations. eGFR's persistently <90 mL/min signify possible Chronic Kidney Disease.    Anion gap 5 5 - 15  Troponin I     Status: None   Collection Time: 03/30/14  8:18 AM  Result Value Ref Range   Troponin I <0.03 <0.031 ng/mL    Comment:        NO INDICATION OF MYOCARDIAL INJURY.     Radiology Reports: Dg Chest 2 View  03/30/2014   CLINICAL DATA:  Sickle cell pain crisis. Diffuse pain. No shortness of breath or cough.  EXAM: CHEST  2 VIEW  COMPARISON:  Chest radiograph 10/28/2013.  FINDINGS: Stable prominent cardiac and mediastinal contours. Unchanged peribronchial thickening. No large consolidative pulmonary opacity. No pleural effusion or pneumothorax. Vertebral body changes compatible with history of sickle cell disease.  IMPRESSION: No acute cardiopulmonary process.   Electronically Signed   By: Lovey Newcomer M.D.   On:  03/30/2014 08:20    Electrocardiogram: Sinus rhythm in the 70s. Normal axis. Intervals are normal. Changes of early repolarization are noted. These are old. No other concerning changes noted. Nonspecific T-wave changes.  Problem List  Principal Problem:   Sickle cell pain crisis Active Problems:   Sickle cell anemia   Hyperbilirubinemia   Assessment: This is a 24 year old African-American male with sickle cell disease and presents with sickle cell pain crises. He has hyperbilirubinemia, possibly due to hemolysis. He has elevated reticulocyte count.  Plan: #1 Sickle cell pain crises: He'll be admitted to the hospital and placed on PCA pump. IV fluids will be given. He will be seen by sickle cell team tomorrow. Repeat reticulocytes counts tomorrow. Left hip pain is most likely due to his sickle cell as well. He had hip films recently, which did not reveal any other abnormalities. He is able to mobilize his joint, however, it is quite painful. Continue folic acid. He is on hydroxyurea at home.  #2 Hyperbilirubinemia: Most likely secondary to hemolysis. Continue to monitor counts.  DVT Prophylaxis: Lovenox Code Status: Full code Family Communication: Discussed with the patient  Disposition Plan: Admit to telemetry   Further management decisions will depend on results of further testing and patient's response to treatment.   [facility]  [facility] Pager [phone]  If 7PM-7AM, please contact night-coverage [email or web address] Password [email or web address]  03/30/2014, 11:13 AM

## 2014-03-30 NOTE — ED Notes (Signed)
Bed: WA09 Expected date: 03/30/14 Expected time: 7:39 AM Means of arrival: Ambulance Comments: Sickle cell out of pain meds

## 2014-03-30 NOTE — ED Notes (Addendum)
Pt from home. Pt reports sickle cell pain crisis which began yesterday am with pain all over. Pt ran out of home pain medication yesterday morning. No n/v/d. Pt ambulatory to room with EMS.

## 2014-03-30 NOTE — Progress Notes (Addendum)
Pt was making inappropriate comments to staff constantly cursing despite staff asking him not to. Pt also moaning loud enough to be heard in hall with door closed. (Another patient complained.) Pt was asking staff to be inappropriately close to the patient and threatening to have them fired for not "being close enough" while patient was lying in the bed. Will continue to monitor.

## 2014-03-30 NOTE — Progress Notes (Addendum)
Blood cultures were placed, but when phlebotomy came to stick him he refused to be stuck and when he agreed his only veins were in his hand which he refused to have stuck because "it feels weird" and was swatting away Phlebotomy. RN and Phlebotomy educated patient, but patient refused. Will page MD

## 2014-03-30 NOTE — ED Provider Notes (Signed)
CSN: [id number]     Arrival date & time 03/30/14  0740 History   First MD Initiated Contact with Patient 03/30/14 [phone]     Chief Complaint  Patient presents with  . Sickle Cell Pain Crisis     (Consider location/radiation/quality/duration/timing/severity/associated sxs/prior Treatment) HPI Comments: Patient complains of typical sickle cell pain crisis since yesterday.  Seen last night for same.  C/o pain in back, legs, chest that is constant and throbbing.  Nothing makes it better. Normally takes Percocet but took his last dose last night. Denies fever or cough. Denies abdominal pain nausea or vomiting. Denies shortness of breath. Does not have a doctor and usually goes to various emergency departments when he has pain.  The history is provided by the patient and the EMS personnel.    Past Medical History  Diagnosis Date  . Sickle cell anemia    Past Surgical History  Procedure Laterality Date  . Cholecystectomy     History reviewed. No pertinent family history. History  Substance Use Topics  . Smoking status: Never Smoker   . Smokeless tobacco: Never Used  . Alcohol Use: No    Review of Systems  Constitutional: Negative for fever, activity change and appetite change.  HENT: Negative for congestion and rhinorrhea.   Respiratory: Positive for chest tightness. Negative for cough and shortness of breath.   Cardiovascular: Positive for leg swelling.  Gastrointestinal: Negative for nausea, vomiting and abdominal pain.  Genitourinary: Negative for dysuria and hematuria.  Musculoskeletal: Positive for myalgias and arthralgias.  Skin: Negative for rash.  Neurological: Negative for dizziness, weakness and headaches.  A complete 10 system review of systems was obtained and all systems are negative except as noted in the HPI and PMH.      Allergies  Morphine and related; Toradol; Tramadol; Vancomycin; and Zosyn  Home Medications   Prior to Admission medications   Medication  Sig Start Date End Date Taking? Authorizing Provider  docusate sodium 100 MG CAPS Take 100 mg by mouth 2 (two) times daily. 02/12/14  Yes Alexa Marvel Plan, MD  folic acid (FOLVITE) 1 MG tablet Take 1 mg by mouth daily.   Yes Historical Provider, MD  hydroxyurea (HYDREA) 500 MG capsule Take 500 mg by mouth 2 (two) times daily. May take with food to minimize GI side effects.   Yes Historical Provider, MD  ibuprofen (ADVIL,MOTRIN) 800 MG tablet Take 1 tablet (800 mg total) by mouth 3 (three) times daily. 02/12/14  Yes Alexa Marvel Plan, MD  oxyCODONE-acetaminophen (PERCOCET/ROXICET) 5-325 MG per tablet Take 1-2 tablets by mouth every 4 (four) hours as needed for moderate pain. 03/21/14  Yes Hollace Kinnier Sofia, PA-C  promethazine (PHENERGAN) 25 MG tablet Take 25 mg by mouth every 6 (six) hours as needed for nausea. For nausea 08/27/13  Yes Historical Provider, MD  methocarbamol (ROBAXIN) 500 MG tablet Take 1 tablet (500 mg total) by mouth 2 (two) times daily. Patient not taking: Reported on 03/29/2014 10/06/13   Hollace Kinnier Sofia, PA-C   BP 131/70 mmHg  Pulse 115  Temp(Src) 101.5 F (38.6 C) (Oral)  Resp 16  Ht 6\' 2"  (1.88 m)  Wt 135 lb (61.236 kg)  BMI 17.33 kg/m2  SpO2 95% Physical Exam  Constitutional: He is oriented to person, place, and time. He appears well-developed and well-nourished. No distress.  uncomfortable  HENT:  Head: Normocephalic and atraumatic.  Mouth/Throat: Oropharynx is clear and moist. No oropharyngeal exudate.  Eyes: Conjunctivae and EOM are normal. Pupils are equal, round,  and reactive to light.  Neck: Normal range of motion. Neck supple.  No meningismus.  Cardiovascular: Normal rate, regular rhythm, normal heart sounds and intact distal pulses.   No murmur heard. Pulmonary/Chest: Effort normal and breath sounds normal. No respiratory distress. He exhibits tenderness.  Abdominal: Soft. There is no tenderness. There is no rebound and no guarding.  Musculoskeletal: Normal range  of motion. He exhibits tenderness. He exhibits no edema.  Diffuse paraspinal back tenderness, FROM all major joints. No erythema  Neurological: He is alert and oriented to person, place, and time. No cranial nerve deficit. He exhibits normal muscle tone. Coordination normal.  No ataxia on finger to nose bilaterally. No pronator drift. 5/5 strength throughout. CN 2-12 intact. Negative Romberg. Equal grip strength. Sensation intact. Gait is normal.   Skin: Skin is warm.  Psychiatric: He has a normal mood and affect. His behavior is normal.  Nursing note and vitals reviewed.   ED Course  Procedures (including critical care time) Labs Review Labs Reviewed  CBC WITH DIFFERENTIAL/PLATELET - Abnormal; Notable for the following:    WBC 13.4 (*)    RBC 3.07 (*)    Hemoglobin 9.4 (*)    HCT 27.0 (*)    RDW 22.3 (*)    Platelets 477 (*)    Neutro Abs 9.1 (*)    Monocytes Absolute 1.3 (*)    All other components within normal limits  RETICULOCYTES - Abnormal; Notable for the following:    Retic Ct Pct 15.6 (*)    RBC. 3.07 (*)    Retic Count, Manual 478.9 (*)    All other components within normal limits  COMPREHENSIVE METABOLIC PANEL - Abnormal; Notable for the following:    Total Bilirubin 5.7 (*)    All other components within normal limits  URINALYSIS, ROUTINE W REFLEX MICROSCOPIC - Abnormal; Notable for the following:    Color, Urine AMBER (*)    APPearance CLOUDY (*)    Hgb urine dipstick TRACE (*)    Urobilinogen, UA 4.0 (*)    All other components within normal limits  URINE RAPID DRUG SCREEN (HOSP PERFORMED) - Abnormal; Notable for the following:    Opiates POSITIVE (*)    Tetrahydrocannabinol POSITIVE (*)    All other components within normal limits  TROPONIN I  URINE MICROSCOPIC-ADD ON  MAGNESIUM  CBC WITH DIFFERENTIAL/PLATELET  RETICULOCYTES  LACTATE DEHYDROGENASE    Imaging Review Dg Chest 2 View  03/30/2014   CLINICAL DATA:  Sickle cell pain crisis. Diffuse pain.  No shortness of breath or cough.  EXAM: CHEST  2 VIEW  COMPARISON:  Chest radiograph 10/28/2013.  FINDINGS: Stable prominent cardiac and mediastinal contours. Unchanged peribronchial thickening. No large consolidative pulmonary opacity. No pleural effusion or pneumothorax. Vertebral body changes compatible with history of sickle cell disease.  IMPRESSION: No acute cardiopulmonary process.   Electronically Signed   By: Lovey Newcomer M.D.   On: 03/30/2014 08:20     EKG Interpretation   Date/Time:  Sunday March 30 2014 08:32:09 EST Ventricular Rate:  72 PR Interval:  149 QRS Duration: 103 QT Interval:  390 QTC Calculation: 427 R Axis:   71 Text Interpretation:  Sinus rhythm Probable left ventricular hypertrophy  ST elev, probable normal early repol pattern No significant change was  found Confirmed by Travone Georg  MD, Arrin Pintor [phone]) on 03/30/2014 8:40:02 AM      MDM   Final diagnoses:  Sickle cell pain crisis   Typical sickle cell pain crisis in back,  legs and chest. No cough or fever.  Hemoglobin stable. Reticulocyte adequate. Bilirubin elevated CXR negative.  Pain remains uncontrolled after first dose of medications. Dilaudid redosed.  Pain remains uncontrolled in the ED and patient will be admitted for further management.  D/w Dr. [place] Pink.    Ezequiel Essex, MD 03/30/14 [phone]

## 2014-03-31 DIAGNOSIS — D5700 Hb-SS disease with crisis, unspecified: Principal | ICD-10-CM

## 2014-03-31 LAB — CBC WITH DIFFERENTIAL/PLATELET
Basophils Absolute: 0 10*3/uL (ref 0.0–0.1)
Basophils Relative: 0 % (ref 0–1)
Eosinophils Absolute: 0 10*3/uL (ref 0.0–0.7)
Eosinophils Relative: 0 % (ref 0–5)
HCT: 25.8 % — ABNORMAL LOW (ref 39.0–52.0)
Hemoglobin: 9 g/dL — ABNORMAL LOW (ref 13.0–17.0)
Lymphocytes Relative: 15 % (ref 12–46)
Lymphs Abs: 2.8 10*3/uL (ref 0.7–4.0)
MCH: 30.4 pg (ref 26.0–34.0)
MCHC: 34.9 g/dL (ref 30.0–36.0)
MCV: 87.2 fL (ref 78.0–100.0)
Monocytes Absolute: 1.9 10*3/uL — ABNORMAL HIGH (ref 0.1–1.0)
Monocytes Relative: 10 % (ref 3–12)
Neutro Abs: 14.1 10*3/uL — ABNORMAL HIGH (ref 1.7–7.7)
Neutrophils Relative %: 75 % (ref 43–77)
Platelets: 375 10*3/uL (ref 150–400)
RBC: 2.96 MIL/uL — ABNORMAL LOW (ref 4.22–5.81)
RDW: 21.9 % — ABNORMAL HIGH (ref 11.5–15.5)
WBC: 18.8 10*3/uL — ABNORMAL HIGH (ref 4.0–10.5)

## 2014-03-31 LAB — MAGNESIUM: Magnesium: 1.8 mg/dL (ref 1.5–2.5)

## 2014-03-31 LAB — RETICULOCYTES
RBC.: 2.96 MIL/uL — ABNORMAL LOW (ref 4.22–5.81)
Retic Count, Absolute: 518 10*3/uL — ABNORMAL HIGH (ref 19.0–186.0)
Retic Ct Pct: 17.5 % — ABNORMAL HIGH (ref 0.4–3.1)

## 2014-03-31 LAB — LACTATE DEHYDROGENASE: LDH: 782 U/L — ABNORMAL HIGH (ref 94–250)

## 2014-03-31 MED ORDER — DEXTROSE-NACL 5-0.45 % IV SOLN
INTRAVENOUS | Status: DC
  Administered 2014-03-31 – 2014-04-08 (×17): via INTRAVENOUS

## 2014-03-31 MED ORDER — SODIUM CHLORIDE 0.9 % IV BOLUS (SEPSIS)
1000.0000 mL | Freq: Once | INTRAVENOUS | Status: AC
  Administered 2014-03-31: 1000 mL via INTRAVENOUS

## 2014-03-31 MED ORDER — HYDROMORPHONE 2 MG/ML HIGH CONCENTRATION IV PCA SOLN
INTRAVENOUS | Status: DC
  Administered 2014-03-31: 5.4 mg via INTRAVENOUS
  Administered 2014-03-31: 22:00:00 via INTRAVENOUS
  Administered 2014-03-31: 1.8 mg via INTRAVENOUS
  Administered 2014-03-31: 4.6 mg via INTRAVENOUS
  Administered 2014-03-31: 4.2 mg via INTRAVENOUS
  Administered 2014-04-01: 6 mg via INTRAVENOUS
  Administered 2014-04-01: 5.4 mg via INTRAVENOUS
  Administered 2014-04-01: 6.8 mg via INTRAVENOUS
  Administered 2014-04-01: 4.2 mg via INTRAVENOUS
  Administered 2014-04-01: 7.2 mg via INTRAVENOUS
  Administered 2014-04-02: 4.4 mg via INTRAVENOUS
  Administered 2014-04-02: 1.2 mg via INTRAVENOUS
  Administered 2014-04-02: 5.4 mg via INTRAVENOUS
  Administered 2014-04-02: 306 mg via INTRAVENOUS
  Administered 2014-04-02: 6.6 mg via INTRAVENOUS
  Administered 2014-04-02: 16:00:00 via INTRAVENOUS
  Administered 2014-04-02: 8.4 mg via INTRAVENOUS
  Administered 2014-04-03: 6 mg via INTRAVENOUS
  Administered 2014-04-03: 14.4 mg via INTRAVENOUS
  Administered 2014-04-03: 7.8 mg via INTRAVENOUS
  Administered 2014-04-03: 7.2 mg via INTRAVENOUS
  Administered 2014-04-03: 4.8 mg via INTRAVENOUS
  Administered 2014-04-03: 6 mg via INTRAVENOUS
  Administered 2014-04-03: 17:00:00 via INTRAVENOUS
  Administered 2014-04-04: 7.2 mg via INTRAVENOUS
  Administered 2014-04-04: 4.8 mg via INTRAVENOUS
  Administered 2014-04-04: 9.6 mg via INTRAVENOUS
  Administered 2014-04-04: 4.2 mg via INTRAVENOUS
  Administered 2014-04-04 (×2): 3.6 mg via INTRAVENOUS
  Administered 2014-04-05: 7.6 mg via INTRAVENOUS
  Administered 2014-04-05: 6.6 mg via INTRAVENOUS
  Administered 2014-04-05: 4.2 mg via INTRAVENOUS
  Administered 2014-04-05: 6 mg via INTRAVENOUS
  Administered 2014-04-05: 2.4 mg via INTRAVENOUS
  Administered 2014-04-06: 08:00:00 via INTRAVENOUS
  Administered 2014-04-06: 8.4 mg via INTRAVENOUS
  Administered 2014-04-06: 7.6 mg via INTRAVENOUS
  Administered 2014-04-06: 4.8 mg via INTRAVENOUS
  Administered 2014-04-06: 9 mg via INTRAVENOUS
  Administered 2014-04-06: 6 mg via INTRAVENOUS
  Administered 2014-04-06: 6.6 mg via INTRAVENOUS
  Administered 2014-04-07: 3.2 mg via INTRAVENOUS
  Administered 2014-04-07: 5 mg via INTRAVENOUS
  Administered 2014-04-07: 4 mg via INTRAVENOUS
  Administered 2014-04-07: 4.2 mg via INTRAVENOUS
  Administered 2014-04-07: 20:00:00 via INTRAVENOUS
  Administered 2014-04-07: 3.6 mg via INTRAVENOUS
  Administered 2014-04-08: 3 mg via INTRAVENOUS
  Administered 2014-04-08: 6.6 mg via INTRAVENOUS
  Filled 2014-03-31 (×8): qty 25

## 2014-03-31 MED ORDER — CEFTRIAXONE SODIUM IN DEXTROSE 20 MG/ML IV SOLN
1.0000 g | Freq: Every day | INTRAVENOUS | Status: DC
  Administered 2014-03-31 – 2014-04-03 (×4): 1 g via INTRAVENOUS
  Filled 2014-03-31 (×5): qty 50

## 2014-03-31 NOTE — Progress Notes (Addendum)
SICKLE CELL SERVICE PROGRESS NOTE  Timothy Marks UYQ:[id number] DOB: 13-Apr-1990 DOA: 03/30/2014 PCP: No PCP Per Patient   Presenting HPI: Timothy Marks is a 24 y.o. male with a past medical history of sickle cell disease, anemia, hyperbilirubinemia, who has had multiple visits to the emergency department, both here in [place], as well as in [place]. He comes in with the above-mentioned complaints. This has been ongoing for 2 days. He was seen in the emergency department last night and was treated with pain medications. He was discharged in satisfactory condition. But the pain recurred this morning. He decided to come back. Pain is sharp pain 9 out of 10 in intensity and feels like his usual sickle cell pain. He denies any fever, chills, no diarrhea, no nausea, vomiting. Denies any falls or injuries recently. Urinating without difficulties. Is able to ambulate.  Consultants:  none  Procedures:  none  Antibiotics:  none  HPI/Subjective: Pt states that he is having pain all over. Specifically his chest, arms, back, and legs. He rates pain as an 8/10. He was placed on a PCA and continues to state that it doesn't work. He takes only Percocet 10mg /325mg . Pt reports not eating and drinking well over the past few days. He did not know he ran a fever last night. Denies any cough, SOB, skin rash/lesions, vomiting, diarrhea, and sick contacts.Per nurses report, pt continues to take off the CO2 monitor and has made inappropriate comments to the staff. In addition, he refuses to cover up even after being asked.  Objective: Filed Vitals:   03/31/14 0155 03/31/14 0418 03/31/14 0529 03/31/14 0919  BP: 129/71  115/59   Pulse: 101  98   Temp: 98.8 F (37.1 C)  99.7 F (37.6 C)   TempSrc: Oral  Oral   Resp: 12 15 16 14   Height:      Weight:      SpO2: 95% 99% 96% 94%   Weight change:   Intake/Output Summary (Last 24 hours) at 03/31/14 1009 Last data filed at 03/31/14 0905  Gross per  24 hour  Intake   1770 ml  Output   3250 ml  Net  -1480 ml    General: Lethargic, in NAD, pt lying in bed naked with sheets covering body, thin HEENT: [place]/AT PEERL, EOMI OROPHARYNX:  Moist, No exudate/ erythema/lesions.  Heart: Regular rate and rhythm, without murmurs, rubs, gallops. Lungs: Clear to auscultation, no wheezing or rhonchi noted.  Abdomen: Soft, nontender, nondistended, positive bowel sounds, no masses no hepatosplenomegaly noted. Neuro: No focal neurological deficits noted cranial nerves II through XII grossly intact.  Musculoskeletal: No warmth, swelling or erythema around joints, no spinal tenderness noted. Extremities: no cyanosis or edema, tender lower extremities.  Data Reviewed: Basic Metabolic Panel:  Recent Labs Lab 03/29/14 1632 03/30/14 0818 03/31/14 0510  NA 139 137  --   K 3.4* 3.5  --   CL 107 104  --   CO2 24 28  --   GLUCOSE 98 91  --   BUN <5* 8  --   CREATININE 0.58 0.58  --   CALCIUM 9.1 8.9  --   MG  --   --  1.8   Liver Function Tests:  Recent Labs Lab 03/29/14 1632 03/30/14 0818  AST 28 30  ALT 12 12  ALKPHOS 73 84  BILITOT 3.8* 5.7*  PROT 7.6 7.7  ALBUMIN 4.2 4.2   No results for input(s): LIPASE, AMYLASE in the last 168 hours. No results for  input(s): AMMONIA in the last 168 hours. CBC:  Recent Labs Lab 03/29/14 1632 03/30/14 0818 03/31/14 0510  WBC 11.0* 13.4* 18.8*  NEUTROABS 7.7 9.1* 14.1*  HGB 9.4* 9.4* 9.0*  HCT 27.3* 27.0* 25.8*  MCV 84.8 87.9 87.2  PLT 511* 477* 375   Cardiac Enzymes:  Recent Labs Lab 03/30/14 0818  TROPONINI <0.03   BNP (last 3 results) No results for input(s): BNP in the last 8760 hours.  ProBNP (last 3 results) No results for input(s): PROBNP in the last 8760 hours.  CBG: No results for input(s): GLUCAP in the last 168 hours.  No results found for this or any previous visit (from the past 240 hour(s)).   Studies: Dg Chest 2 View  03/30/2014   CLINICAL DATA:  Sickle cell  pain crisis. Diffuse pain. No shortness of breath or cough.  EXAM: CHEST  2 VIEW  COMPARISON:  Chest radiograph 10/28/2013.  FINDINGS: Stable prominent cardiac and mediastinal contours. Unchanged peribronchial thickening. No large consolidative pulmonary opacity. No pleural effusion or pneumothorax. Vertebral body changes compatible with history of sickle cell disease.  IMPRESSION: No acute cardiopulmonary process.   Electronically Signed   By: Lovey Newcomer M.D.   On: 03/30/2014 08:20    Scheduled Meds: . antiseptic oral rinse  7 mL Mouth Rinse BID  . docusate sodium  100 mg Oral BID  . enoxaparin (LOVENOX) injection  40 mg Subcutaneous Q24H  . feeding supplement (ENSURE COMPLETE)  237 mL Oral BID BM  . folic acid  1 mg Oral Daily  . HYDROmorphone PCA 2 mg/mL   Intravenous 6 times per day  . Influenza vac split quadrivalent PF  0.5 mL Intramuscular Tomorrow-1000  . pneumococcal 23 valent vaccine  0.5 mL Intramuscular Tomorrow-1000   Continuous Infusions: . dextrose 5 % and 0.45% NaCl 100 mL/hr at 03/31/14 0175    Principal Problem:   Sickle cell pain crisis Active Problems:   Sickle cell anemia   Hyperbilirubinemia   Assessment/Plan: Principal Problem:   Sickle cell pain crisis Active Problems:   Sickle cell anemia   Hyperbilirubinemia  1)Sickle Cell Crisis:  Pt presented with pain consistent with VOC. Pt's pain is an 8/10. He used 21.5 mg of Dilaudid since admission. Will continue Dilaudid PCA and redose boluses from 1mg  q10 to 0.6mg  q10 as pt appears lethargic this morning. Pt reports allergy to Toradol, will avoid. Reiterated to pt the need to wear CO2 monitor so PCA can work effectively. 2) Anemia: Hgb 9.0, unsure of baseline. Will continue to monitor.   3)Sickle Cell Care: Continue Folic acid. Pt has a h/o HgSS disease per care everywhere tab. He was being followed by Dr. Clenton Pare hematology at [facility]. He broke pain contract back in 07/20/12 due to cannabis use. He was  getting Percocet 325/10mg . He has had various providers giving him Percocet or oxycodone seen on [employer]. Last percocet was given on 03/21/09 for 6 days worth. He was previously on Hydrea 1000mg  daily. Will not restart med as I am unsure who now follows pt for this. Appears that he is also seen at [facility], last visit October 2015. They do not prescribe his Hydrea. There was a referral made to our [facility] from there. Will obtain hgb electrophoresis. 4)Fever and leukocytosis: Pt spiked fever to 102.7F last night. 1 blood culture was sent, pt refused 2nd set. UA, not suggestive of UTI. Pt has no obvious source of infection. Fever and leukocytosis may be  reflective of crisis. Will monitor.  5)PsychoSocial: Pt does not have an established PCP. Pt also denies making inappropriate comments to the nurses and understands that it will not be tolerated. Has a history of depression and attempted suicide.   6) FEN/GI : Regular Diet  IV fluids switch to D5/0.45% at 100 ml/hr due to history of poor PO Bowel regimen in place   Code Status: Full  DVT Prophylaxis: enoxaparin  Family Communication: none  Disposition Plan: none  Time spent:35 min, most time was spent counseling  Kalman Shan  Pager [phone]. If 7PM-7AM, please contact night-coverage.  03/31/2014, 10:09 AM  LOS: 1 day   Kalman Shan

## 2014-03-31 NOTE — Progress Notes (Signed)
Pt HR sustaining 140's. Temp 103, BP 144/83. MD notified. Will continue to monitor.

## 2014-03-31 NOTE — Progress Notes (Signed)
Pt educated and instructed to keep O2 in his nose.  PCA pump beeping and will not deliver pain medication.  Pt refuses to wear clothing and exposes genitalia.  Inappropriate sexual comments.  MD made aware.

## 2014-03-31 NOTE — Progress Notes (Signed)
CARE MANAGEMENT NOTE 03/31/2014  Patient:  TAHJE, BORAWSKI   Account Number:  [id number]  Date Initiated:  03/31/2014  Documentation initiated by:  Karl Bales  Subjective/Objective Assessment:   Pt admitted with [facility]     Action/Plan:   from home   Anticipated DC Date:  04/02/2014   Anticipated DC Plan:  [place]  CM consult  [facility]      Choice offered to / List presented to:             Status of service:  In process, will continue to follow Medicare Important Message given?   (If response is "NO", the following Medicare IM given date fields will be blank) Date Medicare IM given:   Medicare IM given by:   Date Additional Medicare IM given:   Additional Medicare IM given by:    Discharge Disposition:    Per UR Regulation:  Reviewed for med. necessity/level of care/duration of stay  If discussed at [place] of Stay Meetings, dates discussed:    Comments:  03/31/14 MMcGibboney, RN, BSN Nursing staff; reports pt  makes sexualized comments to the male nursing staff and continues to not wear any clothes and expose his genitalia. A call to [place] revealed center was closed today related to the weather will call in AM for an appointment for pt.

## 2014-03-31 NOTE — Progress Notes (Signed)
Pt continually complaining of pain, but is non-compliant with o2/co2 sensor so his PCA will not deliver medicine. Pt is irritable when reminded to put in the o2 and insist on doing it himself only to hold it up to his nose and remove it a few seconds later. When RN attempts to place it, he swats her away and yells saying not to touch him because he can do it.. Will continue to remind patient and monitor him.

## 2014-03-31 NOTE — Progress Notes (Signed)
Patient ID: Timothy Marks, male   DOB: [birth year], 24 y.o.   MRN: [id number]  Pt had another fever of 101.20F late morning today. He was re-examined and had no new findings. No signs of meningismus. Got 1 blood culture drawn at this time. He was not cooperative to get another drawn. Total of two blood cultures pending. Ceftriaxone was started to cover for bacteremia. He has an allergy listed against Vanc and Zosyn. Will consider MRSA coverage if pt doesn't improve. Continue antibiotics for at least 48 hours or blood cultures return negative.  -Kalman Shan, MD

## 2014-03-31 NOTE — Progress Notes (Signed)
INITIAL NUTRITION ASSESSMENT  DOCUMENTATION CODES Per approved criteria  -Underweight   INTERVENTION: Continue Ensure Complete po BID, each supplement provides 350 kcal and 13 grams of protein  NUTRITION DIAGNOSIS: Inadequate oral intake related to pain as evidenced by pt report.   Goal: Pt to meet >/= 90% of their estimated nutrition needs   Monitor:  PO and supplemental intake, weight, labs, I/O's  Reason for Assessment: consult for nutritional assessment  Admitting Dx: Sickle cell pain crisis  ASSESSMENT: 24 y.o. male with a past medical history of sickle cell disease, anemia, hyperbilirubinemia. Pt reports not eating and drinking well over the past few days.  Pt in bed, mumbling and moving around in bed. Per staff notes, pt has been making inappropriate comments and exposing himself to staff.  Per weight history, pt's weight is stable. Per MD note, pt reports not eating well the past few days. Ensure supplements have been ordered.  Unable to perform nutrition focused physical exam d/t pt's inappropriate actions towards staff. RD notices visual signs of malnutrition as pt is very thin.  Labs reviewed: WNL  Height: Ht Readings from Last 1 Encounters:  03/30/14 6\' 2"  (1.88 m)    Weight: Wt Readings from Last 1 Encounters:  03/30/14 135 lb (61.236 kg)    Ideal Body Weight: 190 lb  % Ideal Body Weight: 71%  Wt Readings from Last 10 Encounters:  03/30/14 135 lb (61.236 kg)  03/29/14 130 lb (58.968 kg)  02/07/14 135 lb (61.236 kg)  10/06/13 135 lb (61.236 kg)  09/02/13 135 lb (61.236 kg)  04/24/13 125 lb (56.7 kg)  03/30/13 135 lb (61.236 kg)  03/05/13 121 lb 3.2 oz (54.976 kg)  03/02/13 140 lb (63.504 kg)  01/24/13 129 lb 6.4 oz (58.695 kg)    BMI:  Body mass index is 17.33 kg/(m^2).  Estimated Nutritional Needs: Kcal: 1800-2000 Protein: 90-100g Fluid: 1.8L/day  Skin: intact  Diet Order: Diet regular  EDUCATION NEEDS: -No education needs  identified at this time   Intake/Output Summary (Last 24 hours) at 03/31/14 1310 Last data filed at 03/31/14 1304  Gross per 24 hour  Intake   1770 ml  Output   3750 ml  Net  -1980 ml    Last BM: unknown  Labs:   Recent Labs Lab 03/29/14 1632 03/30/14 0818 03/31/14 0510  NA 139 137  --   K 3.4* 3.5  --   CL 107 104  --   CO2 24 28  --   BUN <5* 8  --   CREATININE 0.58 0.58  --   CALCIUM 9.1 8.9  --   MG  --   --  1.8  GLUCOSE 98 91  --     CBG (last 3)  No results for input(s): GLUCAP in the last 72 hours.  Scheduled Meds: . antiseptic oral rinse  7 mL Mouth Rinse BID  . cefTRIAXone (ROCEPHIN)  IV  1 g Intravenous Daily  . docusate sodium  100 mg Oral BID  . enoxaparin (LOVENOX) injection  40 mg Subcutaneous Q24H  . feeding supplement (ENSURE COMPLETE)  237 mL Oral BID BM  . folic acid  1 mg Oral Daily  . HYDROmorphone PCA 2 mg/mL   Intravenous 6 times per day  . Influenza vac split quadrivalent PF  0.5 mL Intramuscular Tomorrow-1000  . pneumococcal 23 valent vaccine  0.5 mL Intramuscular Tomorrow-1000    Continuous Infusions: . dextrose 5 % and 0.45% NaCl 100 mL/hr at 03/31/14 5784  Past Medical History  Diagnosis Date  . Sickle cell anemia     Past Surgical History  Procedure Laterality Date  . Cholecystectomy      Clayton Bibles, MS, RD, LDN Pager: [phone] After Hours Pager: [phone]

## 2014-03-31 NOTE — Progress Notes (Addendum)
Pt was loudly using swear words with open door. Pt had been warned multiple times about use of inappropriate language, but continued to ignore staff. Pt was also lying with genitalia out and would not cover up despite staff telling him.

## 2014-03-31 NOTE — Progress Notes (Signed)
Pt complaining about not getting enough pain medicine from the pump, but refuses to keep oxygen on so pump will not give him medicine per PCA protocol settings. Educated patient, but medicine has made patient confused and forgetful.

## 2014-03-31 NOTE — Progress Notes (Signed)
Pt. caressed students outer thigh as pt temp was being taken. Pt was told this is inappropriate.Pt did not respond.

## 2014-03-31 NOTE — Progress Notes (Signed)
Pt complaining of pain, but will not leave o2 in nose so PCA will not deliver medicine. Pt making multiple inappropriate and sexual comments to staff and is reprimanded not to, but continue to make comments saying "they're just comments." Pt was not making sexual or inappropriate comments when is significant other was here at the beginning of the shift, but comments have become more sexual in nature as the shift has continued.

## 2014-03-31 NOTE — Progress Notes (Signed)
Pt reached max limit of dilaudid this hour on PCA. PCA paused temporarily until lock out ends. Will continue to monitor pain level.

## 2014-03-31 NOTE — Progress Notes (Signed)
Patient ID: Timothy Marks, male   DOB: 1990/05/11, 24 y.o.   MRN: [id number] Pt continues have inappropriate interactions with the staff. He was being reassessed by me and at that time he made comments about not wanting to be here and wishing he was dead. Pt continues to be demanding and obstruct his own care. He makes sexualized comments to the male nursing staff and continues to not wear any clothes and expose his genitalia. Spoke with psych about pt, for which I am concerned if pt is possibly manic/depressive due to his actions. Psych plans to see pt tomorrow. Consult has been placed.  -Kalman Shan, MD

## 2014-04-01 ENCOUNTER — Inpatient Hospital Stay (HOSPITAL_COMMUNITY): Payer: Medicaid Other

## 2014-04-01 ENCOUNTER — Encounter (HOSPITAL_COMMUNITY): Payer: Self-pay | Admitting: Radiology

## 2014-04-01 DIAGNOSIS — R0902 Hypoxemia: Secondary | ICD-10-CM

## 2014-04-01 DIAGNOSIS — E559 Vitamin D deficiency, unspecified: Secondary | ICD-10-CM | POA: Diagnosis present

## 2014-04-01 DIAGNOSIS — F332 Major depressive disorder, recurrent severe without psychotic features: Secondary | ICD-10-CM

## 2014-04-01 DIAGNOSIS — M90512 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, left shoulder: Secondary | ICD-10-CM | POA: Diagnosis present

## 2014-04-01 DIAGNOSIS — J189 Pneumonia, unspecified organism: Secondary | ICD-10-CM

## 2014-04-01 LAB — CBC WITH DIFFERENTIAL/PLATELET
Basophils Absolute: 0 10*3/uL (ref 0.0–0.1)
Basophils Relative: 0 % (ref 0–1)
Eosinophils Absolute: 0 10*3/uL (ref 0.0–0.7)
Eosinophils Relative: 0 % (ref 0–5)
HCT: 22.4 % — ABNORMAL LOW (ref 39.0–52.0)
Hemoglobin: 7.7 g/dL — ABNORMAL LOW (ref 13.0–17.0)
Lymphocytes Relative: 13 % (ref 12–46)
Lymphs Abs: 2.4 10*3/uL (ref 0.7–4.0)
MCH: 29.8 pg (ref 26.0–34.0)
MCHC: 34.4 g/dL (ref 30.0–36.0)
MCV: 86.8 fL (ref 78.0–100.0)
Monocytes Absolute: 1.7 10*3/uL — ABNORMAL HIGH (ref 0.1–1.0)
Monocytes Relative: 9 % (ref 3–12)
Neutro Abs: 14.6 10*3/uL — ABNORMAL HIGH (ref 1.7–7.7)
Neutrophils Relative %: 78 % — ABNORMAL HIGH (ref 43–77)
Platelets: 296 10*3/uL (ref 150–400)
RBC: 2.58 MIL/uL — ABNORMAL LOW (ref 4.22–5.81)
RDW: 19.8 % — ABNORMAL HIGH (ref 11.5–15.5)
WBC: 18.7 10*3/uL — ABNORMAL HIGH (ref 4.0–10.5)
nRBC: 3 /100 WBC — ABNORMAL HIGH

## 2014-04-01 LAB — BASIC METABOLIC PANEL
Anion gap: 3 — ABNORMAL LOW (ref 5–15)
BUN: 9 mg/dL (ref 6–23)
CO2: 26 mmol/L (ref 19–32)
Calcium: 8 mg/dL — ABNORMAL LOW (ref 8.4–10.5)
Chloride: 104 mmol/L (ref 96–112)
Creatinine, Ser: 0.5 mg/dL (ref 0.50–1.35)
GFR calc Af Amer: >90 mL/min (ref >90)
GFR calc non Af Amer: >90 mL/min (ref >90)
Glucose, Bld: 116 mg/dL — ABNORMAL HIGH (ref 70–99)
Potassium: 3.2 mmol/L — ABNORMAL LOW (ref 3.5–5.1)
Sodium: 133 mmol/L — ABNORMAL LOW (ref 135–145)

## 2014-04-01 LAB — TSH: TSH: 2.3 u[IU]/mL (ref 0.350–4.500)

## 2014-04-01 LAB — RETICULOCYTES
RBC.: 2.58 MIL/uL — ABNORMAL LOW (ref 4.22–5.81)
Retic Count, Absolute: 415.4 10*3/uL — ABNORMAL HIGH (ref 19.0–186.0)
Retic Ct Pct: 16.1 % — ABNORMAL HIGH (ref 0.4–3.1)

## 2014-04-01 LAB — LACTATE DEHYDROGENASE: LDH: 752 U/L — ABNORMAL HIGH (ref 94–250)

## 2014-04-01 MED ORDER — KETOROLAC TROMETHAMINE 30 MG/ML IJ SOLN
30.0000 mg | Freq: Once | INTRAMUSCULAR | Status: AC
  Administered 2014-04-01: 30 mg via INTRAVENOUS
  Filled 2014-04-01: qty 1

## 2014-04-01 MED ORDER — SODIUM CHLORIDE 0.9 % IV BOLUS (SEPSIS)
500.0000 mL | Freq: Once | INTRAVENOUS | Status: AC
  Administered 2014-04-01: 500 mL via INTRAVENOUS

## 2014-04-01 MED ORDER — SODIUM CHLORIDE 0.9 % IV SOLN
25.0000 mg | INTRAVENOUS | Status: DC | PRN
  Filled 2014-04-01: qty 0.5

## 2014-04-01 MED ORDER — ESCITALOPRAM OXALATE 10 MG PO TABS
10.0000 mg | ORAL_TABLET | Freq: Every day | ORAL | Status: DC
  Administered 2014-04-01 – 2014-04-09 (×9): 10 mg via ORAL
  Filled 2014-04-01 (×9): qty 1

## 2014-04-01 MED ORDER — HYDROMORPHONE HCL 2 MG/ML IJ SOLN
2.0000 mg | INTRAMUSCULAR | Status: DC | PRN
  Administered 2014-04-04 – 2014-04-05 (×2): 2 mg via INTRAVENOUS
  Filled 2014-04-01 (×2): qty 1

## 2014-04-01 MED ORDER — DIPHENHYDRAMINE HCL 25 MG PO CAPS: 25.0000 mg | ORAL_CAPSULE | Freq: Four times a day (QID) | ORAL | Status: DC | PRN

## 2014-04-01 MED ORDER — SENNOSIDES-DOCUSATE SODIUM 8.6-50 MG PO TABS
1.0000 | ORAL_TABLET | Freq: Two times a day (BID) | ORAL | Status: DC
  Administered 2014-04-01 – 2014-04-09 (×16): 1 via ORAL
  Filled 2014-04-01 (×16): qty 1

## 2014-04-01 MED ORDER — POTASSIUM CHLORIDE CRYS ER 20 MEQ PO TBCR
40.0000 meq | EXTENDED_RELEASE_TABLET | ORAL | Status: AC
  Administered 2014-04-01: 40 meq via ORAL
  Filled 2014-04-01: qty 2

## 2014-04-01 MED ORDER — HYDROMORPHONE BOLUS VIA INFUSION
2.0000 mg | Freq: Once | INTRAVENOUS | Status: AC
  Administered 2014-04-01: 2 mg via INTRAVENOUS

## 2014-04-01 MED ORDER — DEXTROSE 5 % IV SOLN
500.0000 mg | INTRAVENOUS | Status: DC
  Administered 2014-04-01 – 2014-04-03 (×3): 500 mg via INTRAVENOUS
  Filled 2014-04-01 (×3): qty 500

## 2014-04-01 MED ORDER — IOHEXOL 350 MG/ML SOLN
100.0000 mL | Freq: Once | INTRAVENOUS | Status: AC | PRN
  Administered 2014-04-01: 100 mL via INTRAVENOUS

## 2014-04-01 NOTE — Consult Note (Signed)
[facility] Face-to-Face Psychiatry Consult   Reason for Consult:  Manic, hypersexual and inappropriate behaviors and cannabis abuse Referring Physician:  Kalman Shan, MD  Patient Identification: Timothy Marks MRN:  [id number] Principal Diagnosis: Sickle cell pain crisis, Maj. 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 Sickle cell pain crisis [D57.00] 03/30/2014  . Sickle cell anemia [D57.1] 03/30/2014  . Hyperbilirubinemia [E80.6] 03/30/2014  . Hypokalemia [E87.6] 02/07/2014  . Sickle cell crisis [D57.00] 03/03/2013  . Aggressive behavior [F60.89] 01/25/2013    Total Time spent with patient: 45 minutes  Subjective:   Timothy Marks is a 24 y.o. male patient admitted with sickle cell crisis.  HPI: Timothy Marks is a 24 y.o. male seen, chart reviewed and case discussed with Sindy Messing LCSW and staff RN. Reportedly patient has been suffering with sickle cell disease and frequent sickle cell crisis since he was 24 years old. Patient reported he has been depressed, tearful, disturbance of sleep and appetite, somewhat isolated feels like no help and feels somewhat hopeless to get better. Patient is hoping to have cure for sickle cell disease. Patient reported he was stayed that is going to die because one of his friend or family who has sickle cell disease and died at age 80. Patient is also has a cousin who has sickle cell disease and going through the same problems. Patient denies being manic or psychotic or suicidal/homicidal ideation, intention or plans. Patient has no evidence of stated that he has a 62 years old son and send some money from his disability check to support him. Patient want to be alive to see his son growing. Patient lives with his mother and stepdad in [place]. Patient see sickle cell clinic in [place]. Patient contract for safety and willing to cooperate with his medication management  during this hospitalization. Patient endorses being frustrated and upset. Patient also reportedly cursing himself and stepfather people. Patient denies cursing staff members on the unit. Patient did remember being admitted to behavioral [place] sometime ago but does not remember complete details and possible medication management. Patient reportedly not followed with outpatient psychiatric services after admitted to hospital. Reportedly patient graduated from the [employer] high school.  Medical history: Patient with a past medical history of sickle cell disease, anemia, hyperbilirubinemia, who has had multiple visits to the emergency department, both here in [place], as well as in [place]. He comes in with the above-mentioned complaints. This has been ongoing for 2 days. He was seen in the emergency department last night and was treated with pain medications. He was discharged in satisfactory condition. But the pain recurred this morning. He decided to come back. Pain is sharp pain 9 out of 10 in intensity and feels like his usual sickle cell pain. He denies any fever, chills, no diarrhea, no nausea, vomiting. Denies any falls or injuries recently. Urinating without difficulties. Is able to ambulate HPI Elements:   Location:  Depression. Quality:  Poor. Severity:  Tearful and hopeless. Timing:  Sickle cell crisis. Duration:  Few weeks. Context:  Unknown psychosocial stressors.  Past Medical History:  Past Medical History  Diagnosis Date  . Sickle cell anemia     Past Surgical History  Procedure Laterality Date  . Cholecystectomy     Family History: History reviewed. No pertinent family history. Social History:  History  Alcohol Use No     History  Drug Use No    History  Social History  . Marital Status: Single    Spouse Name: N/A  . Number of Children: N/A  . Years of Education: N/A   Social History Main Topics  . Smoking status: Never Smoker   .  Smokeless tobacco: Never Used  . Alcohol Use: No  . Drug Use: No  . Sexual Activity: Not on file   Other Topics Concern  . None   Social History Narrative   Additional Social History:                          Allergies:   Allergies  Allergen Reactions  . Morphine And Related Hives  . Toradol [Ketorolac Tromethamine] Itching  . Tramadol Hives  . Vancomycin Itching  . Zosyn [Piperacillin Sod-Tazobactam So] Itching and Rash    Vitals: Blood pressure 122/71, pulse 126, temperature 99.3 F (37.4 C), temperature source Oral, resp. rate 17, height 6\' 2"  (1.88 m), weight 61.236 kg (135 lb), SpO2 100 %.  Risk to Self: Is patient at risk for suicide?: No Risk to Others:   Prior Inpatient Therapy:   Prior Outpatient Therapy:    Current Facility-Administered Medications  Medication Dose Route Frequency Provider Last Rate Last Dose  . acetaminophen (TYLENOL) tablet 650 mg  650 mg Oral Q4H PRN Bonnielee Haff, MD   650 mg at 04/01/14 0543   Or  . acetaminophen (TYLENOL) suppository 650 mg  650 mg Rectal Q4H PRN Bonnielee Haff, MD      . antiseptic oral rinse (CPC / CETYLPYRIDINIUM CHLORIDE 0.05%) solution 7 mL  7 mL Mouth Rinse BID Bonnielee Haff, MD   7 mL at 03/30/14 2200  . cefTRIAXone (ROCEPHIN) 1 g in dextrose 5 % 50 mL IVPB - Premix  1 g Intravenous Daily Kalman Shan, MD   1 g at 03/31/14 1305  . dextrose 5 %-0.45 % sodium chloride infusion   Intravenous Continuous Olabunmi Agboola, MD 100 mL/hr at 04/01/14 0930    . diphenhydrAMINE (BENADRYL) capsule 25-50 mg  25-50 mg Oral Q6H PRN Leana Gamer, MD       Or  . diphenhydrAMINE (BENADRYL) 25 mg in sodium chloride 0.9 % 50 mL IVPB  25 mg Intravenous Q4H PRN Leana Gamer, MD      . docusate sodium (COLACE) capsule 100 mg  100 mg Oral BID Bonnielee Haff, MD   100 mg at 03/31/14 0918  . enoxaparin (LOVENOX) injection 40 mg  40 mg Subcutaneous Q24H Bonnielee Haff, MD   40 mg at 03/31/14 1438  . feeding  supplement (ENSURE COMPLETE) (ENSURE COMPLETE) liquid 237 mL  237 mL Oral BID BM Bonnielee Haff, MD   237 mL at 65/78/46 9629  . folic acid (FOLVITE) tablet 1 mg  1 mg Oral Daily Bonnielee Haff, MD   1 mg at 03/31/14 0918  . HYDROmorphone (DILAUDID) 2 mg/ml High Concentration PCA injection   Intravenous 6 times per day Kalman Shan, MD      . HYDROmorphone (DILAUDID) injection 2 mg  2 mg Intravenous Q3H PRN Leana Gamer, MD      . Influenza vac split quadrivalent PF (FLUARIX) injection 0.5 mL  0.5 mL Intramuscular Tomorrow-1000 Bonnielee Haff, MD      . ketorolac (TORADOL) 30 MG/ML injection 30 mg  30 mg Intravenous Once Leana Gamer, MD      . naloxone [facility]) injection 0.4 mg  0.4 mg Intravenous PRN Bonnielee Haff, MD  And  . sodium chloride 0.9 % injection 9 mL  9 mL Intravenous PRN Bonnielee Haff, MD      . ondansetron (ZOFRAN) tablet 4 mg  4 mg Oral Q4H PRN Bonnielee Haff, MD       Or  . ondansetron (ZOFRAN) injection 4 mg  4 mg Intravenous Q4H PRN Bonnielee Haff, MD   4 mg at 03/31/14 0258  . pneumococcal 23 valent vaccine (PNU-IMMUNE) injection 0.5 mL  0.5 mL Intramuscular Tomorrow-1000 Bonnielee Haff, MD      . polyethylene glycol (MIRALAX / GLYCOLAX) packet 17 g  17 g Oral Daily PRN Bonnielee Haff, MD        Musculoskeletal: Strength & Muscle Tone: within normal limits Gait & Station: unable to stand Patient leans: N/A  Psychiatric Specialty Exam: Physical Exam as per history and physical   ROS multiple body pains, depression, dysphoria and disturbance of sleep and appetite.   Blood pressure 122/71, pulse 126, temperature 99.3 F (37.4 C), temperature source Oral, resp. rate 17, height 6\' 2"  (1.88 m), weight 61.236 kg (135 lb), SpO2 100 %.Body mass index is 17.33 kg/(m^2).  General Appearance: Casual  Eye Contact::  Good  Speech:  Clear and Coherent  Volume:  Normal  Mood:  Angry, Depressed, Dysphoric and Hopeless  Affect:  Depressed and Tearful   Thought Process:  Coherent and Goal Directed  Orientation:  Full (Time, Place, and Person)  Thought Content:  WDL and Rumination  Suicidal Thoughts:  No  Homicidal Thoughts:  No  Memory:  Immediate;   Fair Recent;   Fair  Judgement:  Fair  Insight:  Fair  Psychomotor Activity:  Decreased  Concentration:  Good  Recall:  Good  Fund of Knowledge:Good  Language: Good  Akathisia:  NA  Handed:  Right  AIMS (if indicated):     Assets:  Communication Skills Desire for Improvement Financial Resources/Insurance Housing Intimacy Leisure Time Resilience Social Support  ADL's:  Impaired  Cognition: WNL  Sleep:      Medical Decision Making: Review of Psycho-Social Stressors (1), Review or order clinical lab tests (1), Review and summation of old records (2), Established Problem, Worsening (2), New Problem, with no additional work-up planned (3), Review or order medicine tests (1), Review of Medication Regimen & Side Effects (2) and Review of New Medication or Change in Dosage (2)  Treatment Plan Summary: Daily contact with patient to assess and evaluate symptoms and progress in treatment and Medication management  Plan:  Restart Lexapro 10 mg daily for depression Patient does not meet criteria for psychiatric inpatient admission. Supportive therapy provided about ongoing stressors.  Disposition: Patient may be able to the chest home with the outpatient psychiatric services and medically stable.  Elya Tarquinio,JANARDHAHA R. 04/01/2014 10:54 AM

## 2014-04-01 NOTE — Progress Notes (Signed)
[facility]

## 2014-04-01 NOTE — Progress Notes (Signed)
Clinical Social Work Department CLINICAL SOCIAL WORK PSYCHIATRY SERVICE LINE ASSESSMENT 04/01/2014  Patient:  Timothy Marks  Account:  [id number]  Admit Date:  03/30/2014  Clinical Social Worker:  Sindy Messing, LCSW  Date/Time:  04/01/2014 12:00 N Referred by:  Physician  Date referred:  04/01/2014 Reason for Referral  Psychosocial assessment   Presenting Symptoms/Problems (In the person's/family's own words):   Psych consulted due to manic behaviors.   Abuse/Neglect/Trauma History (check all that apply)  Denies history   Abuse/Neglect/Trauma Comments:   Psychiatric History (check all that apply)  Inpatient/hospitilization   Psychiatric medications:  Lexapro 10 mg   Current Mental Health Hospitalizations/Previous Mental Health History:   Patient denies any formal diagnosis. Patient reports he has been feeling depressed lately due to Sickle Cell disease and wishes that he was feeling better.   Current provider:   None currently   Place and Date:   N/A   Current Medications:   Scheduled Meds:      . antiseptic oral rinse  7 mL Mouth Rinse BID  . azithromycin  500 mg Intravenous Q24H  . cefTRIAXone (ROCEPHIN)  IV  1 g Intravenous Daily  . docusate sodium  100 mg Oral BID  . enoxaparin (LOVENOX) injection  40 mg Subcutaneous Q24H  . escitalopram  10 mg Oral Daily  . feeding supplement (ENSURE COMPLETE)  237 mL Oral BID BM  . folic acid  1 mg Oral Daily  . HYDROmorphone PCA 2 mg/mL   Intravenous 6 times per day  . Influenza vac split quadrivalent PF  0.5 mL Intramuscular Tomorrow-1000  . pneumococcal 23 valent vaccine  0.5 mL Intramuscular Tomorrow-1000  . potassium chloride  40 mEq Oral Q2H        Continuous Infusions:      . dextrose 5 % and 0.45% NaCl 100 mL/hr at 04/01/14 0930          PRN Meds:.acetaminophen **OR** acetaminophen, diphenhydrAMINE **OR** diphenhydrAMINE (BENADRYL) IVPB(SICKLE CELL ONLY), HYDROmorphone (DILAUDID) injection, naloxone **AND** sodium  chloride, ondansetron **OR** ondansetron (ZOFRAN) IV, polyethylene glycol       Previous Impatient Admission/Date/Reason:   Patient reports he was at [facility] in 2014 because he was feeling depressed.   Emotional Health / Current Symptoms    Suicide/Self Harm  Suicidal ideation (ex: "I can't take any more,I wish I could disappear")   Suicide attempt in the past:   Patient reports that prior to having a son that he felt like he wanted to end his life. Patient reports he now has motivation to live and would never kill himself. Patient denies any SI or HI.   Other harmful behavior:   None reported   Psychotic/Dissociative Symptoms  None reported   Other Psychotic/Dissociative Symptoms:   N/A    Attention/Behavioral Symptoms  Within Normal Limits   Other Attention / Behavioral Symptoms:   Patient engaged during assessment.    Cognitive Impairment  Within Normal Limits   Other Cognitive Impairment:   Patient alert and oriented.    Mood and Adjustment  DEPRESSION    Stress, Anxiety, Trauma, Any Recent Loss/Stressor  None reported   Anxiety (frequency):   N/A   Phobia (specify):   N/A   Compulsive behavior (specify):   N/A   Obsessive behavior (specify):   N/A   Other:   N/A   Substance Abuse/Use  Current substance use   SBIRT completed (please refer for detailed history):  Y  Self-reported substance use:   Patient reports he smokes between  1-4 blunts a day in order to manage his pain. Patient denies any other substance use. Patient reports that he does not use substances for any emotional connections but only needs marijuana because pain is unmanageable.   Urinary Drug Screen Completed:  Y Alcohol level:   N/A    Environmental/Housing/Living Arrangement  Stable housing   Who is in the home:   Mom   Emergency contact:  Sandra-mom   Financial  Medicaid   Patient's Strengths and Goals (patient's own words):   Patient reports mother is supportive.  Patient receives disability check. Patient has good relationship with his son.   Clinical Social Worker's Interpretive Summary:   CSW received referral in order to complete psychosocial assessment. CSW reviewed chart and met with patient at bedside with psych MD.    Patient laying in bed and agreeable to assessment. Patient reports that he lives at home with his mother and her boyfriend. Patient is currently receiving disability and reports he is hospitalized often due to Sickle Cell Crisis. Patient was going to sickle cell clinic in [facility] but had trouble with Medicaid transportation and changed to [facility] sickle cell clinic. Patient reports he has a girlfriend who is supportive and lives in [place]. Patient has a 24 year old son who lives in [place].    Patient denies any depression or manic symptoms. Patient reports that he was hospitalized at [facility] in 2014 but cannot remember specific details. Per chart review, patient made statements about wanting to die. Patient reports that he did not mean these comments but was upset and in pain. Patient denies any SI or HI and contracts for safety. Patient reports that he is tired of beng in pain and wishes that there was a cure for sickle cell. Patient reports he would never harm himself because he needs to be around his son.    Patient denies any inappropriate behavior and reports that he has been cursing but only due to pain. Patient reports he is feeling better today and is trying to stay in good spirits despite pain. Patient tearful during assessment and reports that he wants to feel better in order to have more energy. Patient reports his sleep is usually interrupted and spoke with psych MD about starting a medication to assist with mood stabilization. Psych MD to make medication recommendations.    CSW staffed case with psych MD and will continue to follow.   Disposition:  Recommend Psych CSW continuing to support while in  hospital   [place], [place] [phone]

## 2014-04-01 NOTE — Progress Notes (Signed)
SICKLE CELL SERVICE PROGRESS NOTE  Timothy Marks JSE:[id number] DOB: Oct 31, 1990 DOA: 03/30/2014 PCP: [facility]  Assessment/Plan: Principal Problem:   Sickle cell pain crisis Active Problems:   Sickle cell anemia   Hyperbilirubinemia  1. Hypoxemia: Pt with persistent hypoxemia and right sided chest pain in the setting of Acute sickle cell crisis. This could very well be a pneumonia but in light tachycardia, will obtain CT angiogram to evaluate for PE and also for consolidation.  2. B/L pneumonia: CT angiogram resulterd adn negative for PE but shows findings consistent with Pneumonia vs PE. However b/l pleural effusions and clinical picture are more consistent with Pneumonia. He is on Rocephin Day #2. Will give one more day, will add coverage for atypical and if WBC still elevated will consider Staph aureus coverage..  3. Hb SS with crisis: (There is some question as to whether this patient has HPFH per review of his records from [facility]) Pt having excruciating pain in left chest wall and left shoulder and arm. 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 I will continue PCa at current dosing but add clinician assisted doses for breakthrough pain. He lists an allergy to Toradol as itching. I will give a trial of Toradol as I feel that the benefits outweigh the risks and will make a significant difference in the control of pain.  4. Leukocytosis: Secondary to Pneumonia. 5. Sickle Cell Disease: From a review of his records, He has had Pneumococcal 23-valent vaccine on 09/18/2008, Meningococcal Conjugate 12/20/2007, H1N1 12/20/2007.  6. Tachycardia: EKG reviewed Early repolarization changes. Will check TSH. If persists will consider B-Blocker. 7. Vitamin D Deficiency: Although this is a chronic problem and managed best in the out patient setting, it may be of some  importance to supplement in light of his history of osteonecrosis and the severe pain hopefully represents only an infarct and not infection. 8. Anemia: Pt has a baseline Hb of 9.7. His Hb had decrease 2 grams which is expected during crisis. Will continue to monitor. 9. Psychosocial: Per nursing staff, Pt has made inappropriate innuendos toward staff and has been somewhat non-compliant. However his behavior has improved in the last 24 hour. He is to be seen by Psychiatry today. I spoke with Timothy Marks from Timothy Marks about Timothy Marks. Mr Soledad Marks reports that he has been dismissed from the practice because of threatening behaviors and inappropriate sexual advances to a Physician. Timothy Marks does not recall Psychiatric evaluation in the past. However a review of Timothy Marks's records notes mention of a history of suicidal ideations. 10. Medication non-compliance: Pt reports compliance with Hydrea however it is not reflected in MCV.  11. Constipation: Discontinue Docusate and prescribe Senna-S for improved gut motility.   Code Status: Full Code Family Communication: N/A Disposition Plan: Not yet ready for discharge  [place].  Pager [phone]. If 7PM-7AM, please contact night-coverage.  04/01/2014, 1:35 PM  LOS: 2 days    Consultants:  Dr. Louretta Shorten- Psychiatry  Procedures:  None  Antibiotics:  Rocephin 2/15>>  Azithromycin 2/16 >>  HPI/Subjective: Pt reports that pain localized to anterior chest wall R>L, left shoulder and left chest wall. He rates the chest wall pain as 8/10 and left shoulder pain as 9/10. Pt crying and states that she is unable to move LUE independently due to pain. Last BM 2 days ago.  Objective: Filed Vitals:  04/01/14 0544 04/01/14 0845 04/01/14 1005 04/01/14 1250  BP: 122/71     Pulse: 126     Temp: 102.9 F (39.4 C)  99.3 F (37.4 C)   TempSrc: Oral  Oral   Resp: 24 17  16   Height:      Weight:      SpO2: 97% 100%  98%   Weight change:    Intake/Output Summary (Last 24 hours) at 04/01/14 1335 Last data filed at 04/01/14 1225  Gross per 24 hour  Intake  12040 ml  Output   3175 ml  Net   8865 ml    General: Alert, awake, oriented x3, in no acute distress.  HEENT: Timothy Marks/AT PEERL, EOMI, anicteric OROPHARYNX:  Moist, No exudate/ erythema/lesions.  Heart: Regular rate and rhythm, without murmurs, rubs, gallops, PMI non-displaced, no heaves or thrills on palpation.  Lungs: Clear to auscultation, no wheezing or rhonchi noted and decreased air entry at bases. Mild increase in vocal fremitus and dulness to percussion noted at bases. Abdomen: Soft, nontender, nondistended, positive bowel sounds, no masses no hepatosplenomegaly noted.  Neuro: No focal neurological deficits noted cranial nerves II through XII grossly intact.  Strength unable to assess secondary to pain. Musculoskeletal: No warm swelling or erythema around joints, no spinal tenderness noted. Psychiatric: Patient alert and oriented x3,his affect is normal and his tone very respectful today.  Lymph node survey: No cervical axillary or inguinal lymphadenopathy noted.   Data Reviewed: Basic Metabolic Panel:  Recent Labs Lab 03/29/14 1632 03/30/14 0818 03/31/14 0510 04/01/14 0450  NA 139 137  --  133*  K 3.4* 3.5  --  3.2*  CL 107 104  --  104  CO2 24 28  --  26  GLUCOSE 98 91  --  116*  BUN <5* 8  --  9  CREATININE 0.58 0.58  --  0.50  CALCIUM 9.1 8.9  --  8.0*  MG  --   --  1.8  --    Liver Function Tests:  Recent Labs Lab 03/29/14 1632 03/30/14 0818  AST 28 30  ALT 12 12  ALKPHOS 73 84  BILITOT 3.8* 5.7*  PROT 7.6 7.7  ALBUMIN 4.2 4.2   No results for input(s): LIPASE, AMYLASE in the last 168 hours. No results for input(s): AMMONIA in the last 168 hours. CBC:  Recent Labs Lab 03/29/14 1632 03/30/14 0818 03/31/14 0510 04/01/14 0450  WBC 11.0* 13.4* 18.8* 18.7*  NEUTROABS 7.7 9.1* 14.1* 14.6*  HGB 9.4* 9.4* 9.0* 7.7*  HCT 27.3* 27.0*  25.8* 22.4*  MCV 84.8 87.9 87.2 86.8  PLT 511* 477* 375 296   Cardiac Enzymes:  Recent Labs Lab 03/30/14 0818  TROPONINI <0.03   BNP (last 3 results) No results for input(s): BNP in the last 8760 hours.  ProBNP (last 3 results) No results for input(s): PROBNP in the last 8760 hours.  CBG: No results for input(s): GLUCAP in the last 168 hours.  Recent Results (from the past 240 hour(s))  Blood culture (routine x 2)     Status: None (Preliminary result)   Collection Time: 03/31/14  5:10 AM  Result Value Ref Range Status   Specimen Description BLOOD RIGHT ARM  Final   Special Requests BOTTLES DRAWN AEROBIC AND ANAEROBIC 4CC  Final   Culture   Final           BLOOD CULTURE RECEIVED NO GROWTH TO DATE CULTURE WILL BE HELD FOR 5 DAYS BEFORE ISSUING A FINAL NEGATIVE  REPORT Performed at [facility]    Report Status PENDING  Incomplete  Culture, blood (routine x 2)     Status: None (Preliminary result)   Collection Time: 03/31/14 11:47 AM  Result Value Ref Range Status   Specimen Description BLOOD LEFT HAND  Final   Special Requests BOTTLES DRAWN AEROBIC ONLY 5CC  Final   Culture   Final           BLOOD CULTURE RECEIVED NO GROWTH TO DATE CULTURE WILL BE HELD FOR 5 DAYS BEFORE ISSUING A FINAL NEGATIVE REPORT Performed at [facility]    Report Status PENDING  Incomplete     Studies: Dg Chest 2 View  03/30/2014   CLINICAL DATA:  Sickle cell pain crisis. Diffuse pain. No shortness of breath or cough.  EXAM: CHEST  2 VIEW  COMPARISON:  Chest radiograph 10/28/2013.  FINDINGS: Stable prominent cardiac and mediastinal contours. Unchanged peribronchial thickening. No large consolidative pulmonary opacity. No pleural effusion or pneumothorax. Vertebral body changes compatible with history of sickle cell disease.  IMPRESSION: No acute cardiopulmonary process.   Electronically Signed   By: Lovey Newcomer M.D.   On: 03/30/2014 08:20   Ct Angio Chest Pe W/cm &/or Wo  Cm  04/01/2014   CLINICAL DATA:  Hypoxia, tachycardia, hx of sickle cell dz, ? PE  EXAM: CT ANGIOGRAPHY CHEST WITH CONTRAST  TECHNIQUE: Multidetector CT imaging of the chest was performed using the standard protocol during bolus administration of intravenous contrast. Multiplanar CT image reconstructions and MIPs were obtained to evaluate the vascular anatomy.  CONTRAST:  174mL OMNIPAQUE IOHEXOL 350 MG/ML SOLN  COMPARISON:  10/27/2010  FINDINGS: The SVC is patent. The right atrium is nondilated. RV/LV ratio normal, less than 1. Satisfactory opacification of pulmonary arteries noted, and there is no evidence of pulmonary emboli. Patent superior and inferior pulmonary veins bilaterally. Adequate contrast opacification of the thoracic aorta with no evidence of dissection, aneurysm, or stenosis. There is classic 3-vessel brachiocephalic arch anatomy without proximal stenosis.  Small bilateral pleural effusions. No pericardial effusion. No hilar or mediastinal adenopathy. Subpleural patchy atelectasis or scarring posteriorly in both lower lobes right greater than left. Sclerosis in multiple mid and lower thoracic vertebral segments with endplate changes characteristic of sickle cell disease. Sternum intact. Small calcified spleen. Remainder visualized upper abdomen unremarkable.  Review of the MIP images confirms the above findings.  IMPRESSION: 1. Negative for acute PE or thoracic aortic dissection. 2. Small pleural effusions with adjacent consolidation/atelectasis posteriorly in the lower lobes, right greater than left. 3. Thoracic spine and splenic changes characteristic of sickle cell disease.   Electronically Signed   By: Lucrezia Europe M.D.   On: 04/01/2014 13:08    Scheduled Meds: . antiseptic oral rinse  7 mL Mouth Rinse BID  . cefTRIAXone (ROCEPHIN)  IV  1 g Intravenous Daily  . docusate sodium  100 mg Oral BID  . enoxaparin (LOVENOX) injection  40 mg Subcutaneous Q24H  . escitalopram  10 mg Oral Daily  .  feeding supplement (ENSURE COMPLETE)  237 mL Oral BID BM  . folic acid  1 mg Oral Daily  . HYDROmorphone PCA 2 mg/mL   Intravenous 6 times per day  . Influenza vac split quadrivalent PF  0.5 mL Intramuscular Tomorrow-1000  . pneumococcal 23 valent vaccine  0.5 mL Intramuscular Tomorrow-1000  . potassium chloride  40 mEq Oral Q2H   Continuous Infusions: . dextrose 5 % and 0.45% NaCl 100 mL/hr at 04/01/14 0930  Principal Problem:   Sickle cell pain crisis Active Problems:   Sickle cell anemia   Hyperbilirubinemia  Total time spent 55 minutes.

## 2014-04-02 DIAGNOSIS — R509 Fever, unspecified: Secondary | ICD-10-CM

## 2014-04-02 LAB — URINALYSIS, ROUTINE W REFLEX MICROSCOPIC
Bilirubin Urine: NEGATIVE
Glucose, UA: NEGATIVE mg/dL
Ketones, ur: 15 mg/dL — AB
Leukocytes, UA: NEGATIVE
Nitrite: NEGATIVE
Protein, ur: NEGATIVE mg/dL
Specific Gravity, Urine: 1.011 (ref 1.005–1.030)
Urobilinogen, UA: 4 mg/dL — ABNORMAL HIGH (ref 0.0–1.0)
pH: 6 (ref 5.0–8.0)

## 2014-04-02 LAB — BASIC METABOLIC PANEL
Anion gap: 7 (ref 5–15)
BUN: 6 mg/dL (ref 6–23)
CO2: 29 mmol/L (ref 19–32)
Calcium: 8.4 mg/dL (ref 8.4–10.5)
Chloride: 103 mmol/L (ref 96–112)
Creatinine, Ser: 0.48 mg/dL — ABNORMAL LOW (ref 0.50–1.35)
GFR calc Af Amer: >90 mL/min (ref >90)
GFR calc non Af Amer: >90 mL/min (ref >90)
Glucose, Bld: 101 mg/dL — ABNORMAL HIGH (ref 70–99)
Potassium: 3.4 mmol/L — ABNORMAL LOW (ref 3.5–5.1)
Sodium: 139 mmol/L (ref 135–145)

## 2014-04-02 LAB — URINE MICROSCOPIC-ADD ON

## 2014-04-02 MED ORDER — HYDROXYZINE HCL 50 MG PO TABS
50.0000 mg | ORAL_TABLET | Freq: Every day | ORAL | Status: DC
  Administered 2014-04-02 – 2014-04-08 (×7): 50 mg via ORAL
  Filled 2014-04-02 (×7): qty 1

## 2014-04-02 MED ORDER — ACETAMINOPHEN 325 MG PO TABS
650.0000 mg | ORAL_TABLET | ORAL | Status: DC | PRN
  Administered 2014-04-03: 650 mg via ORAL
  Filled 2014-04-02: qty 2

## 2014-04-02 MED ORDER — BOOST PLUS PO LIQD
237.0000 mL | Freq: Three times a day (TID) | ORAL | Status: DC
Start: 2014-04-02 — End: 2014-04-09
  Administered 2014-04-02 – 2014-04-09 (×6): 237 mL via ORAL
  Filled 2014-04-02 (×23): qty 237

## 2014-04-02 MED ORDER — SODIUM CHLORIDE 0.9 % IV BOLUS (SEPSIS)
500.0000 mL | Freq: Once | INTRAVENOUS | Status: AC
  Administered 2014-04-02: 500 mL via INTRAVENOUS

## 2014-04-02 MED ORDER — POTASSIUM CHLORIDE CRYS ER 20 MEQ PO TBCR
40.0000 meq | EXTENDED_RELEASE_TABLET | ORAL | Status: AC
  Administered 2014-04-02 (×2): 40 meq via ORAL
  Filled 2014-04-02 (×2): qty 2

## 2014-04-02 NOTE — Progress Notes (Addendum)
Temp is 101.0 at this time.  K Schorr was notified earlier for same temp rest of VS are stable.  Pt continues to talk loudly out loud.  When asked, he acknowledges that the people he is talking with are NOT present but "I',m sending out a message to them .Marland Kitchen... if they hear me they hear me" at one time he is talking to "my 2 roommates" and another "I am calling the police b/c they broke into my girlfriend's home".  I have reminded him to call us when he needs to urinate so we can help him collect the urinalysis he verbalizes understanding.

## 2014-04-02 NOTE — Progress Notes (Signed)
Clinical Social Work  CSW staffed case with attending MD (Dr. Zigmund Daniel) who reports that she staffed patient's case with previous sickle cell clinic who reports patient was dismissed from their practice in 2014 due to inappropriate behavior. CSW rounded on patient with psych MD. Patient laying in bed and drowsy when medical team arrived. Patient reports he continues to be in pain and has been using PCA pump for relief. Patient reports that staff "has been telling lies on me." Patient denies urinating on the floor and reports that he poured his orange juice out on the floor in respect for family members who have passed away. Patient denies any aggressive or sexual behavior and reports that RNs need to show him more respect. Patient reports his mother taught him to be respectful to everyone even if they are disrespectful towards him. Patient denies any hallucinations and reports that he often talks loud and speaks out loud because that is the behavior he is familiar with at home. Patient guarded and has flat affect. Patient reports he has been taking medication that psych MD prescribed and has no needs at this time.  [place], [place] [phone]

## 2014-04-02 NOTE — Progress Notes (Addendum)
I have talked with Timothy Marks again about elevated temps, we are now giving him a bolus, he is refusing Tylenol, and is refusing morning lab draw.  Will try again before the shift is over.  He states "I want to finish talking to my grandmother first" - is increasing in agitation.  When I asked him if his grandmother is here in the room with him he states "no - I am just talking out loud to her".

## 2014-04-02 NOTE — Progress Notes (Signed)
Pt continues to refuse labs,or re-check of temp this AM, and is voiding onto the floor.  Education efforts increase the patients irritability and compliance.  Continues to be able to answer orientation questions and continues to frequently talk with people not present in his room.  Dr. Zigmund Daniel has called me this AM and has read my notes, and to be clear, the provider on call that I spoke with throughout the night was K. Schorr as my progress notes reflect. And I have had my progress notes  double checked by  Lesly Rubenstein RN.

## 2014-04-02 NOTE — Progress Notes (Signed)
SICKLE CELL SERVICE PROGRESS NOTE  Timothy Marks XNA:[id number] DOB: Jun 28, 1990 DOA: 03/30/2014 PCP: [facility]  Assessment/Plan: Principal Problem:   Sickle cell pain crisis Active Problems:   Sickle cell anemia   Hyperbilirubinemia   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, left shoulder   Vitamin D deficiency  1. Hypoxemia: Improved. CT reviewed and will findings consistent with Pneumonia noted. Oxygenation improved today. Will wean Oxygen and continue Rocephin and Azithromycin. 2. B/L pneumonia: CT angiogram resulterd adn negative for PE but shows findings consistent with Pneumonia vs PE. However b/l pleural effusions and clinical picture are more consistent with Pneumonia. He is on Rocephin Day #3 and Azithromycin day #2.Clinically he is improved. However he has refused labs this morning and he has now agreed to them. Will review labs in the context of the clinical picture and make further decisions. An MRI of the Left shoulder was ordered to evaluate for osteoarthritis, however the pain and movement of his shoulder has improved. I will cancel the MRI and observe clinically. 3. Hb SS with crisis: (There is some question as to whether this patient has HPFH per review of his records from [facility]) Pt having excruciating pain in left chest wall and left shoulder and arm. 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 I will continue PCa at current dosing but add clinician assisted doses for breakthrough pain. He lists an allergy to Toradol as itching. I will give a trial of Toradol as I feel that the benefits outweigh the risks and will make a significant difference in the control of pain.  4. Underweight: Pt appears emaciated and with muscle wasting. He reports that this is his normal body habitus. He reports today that he has not walked much in the last  3 weeks due to his Sickle Cell Crisis. His Physical functioning is unclear. If not imoproved by tomorrow, I will ask PT and OT to evaluate. Will also obtain a nutrition consult. 5. Leukocytosis: Secondary to Pneumonia. Awaiting today's labs. 6. Sickle Cell Disease: From a review of his records, He has had Pneumococcal 23-valent vaccine on 09/18/2008, Meningococcal Conjugate 12/20/2007, H1N1 12/20/2007.  7. Tachycardia: EKG reviewed Early repolarization changes. Will check TSH. If persists will consider B-Blocker. Will obtain 12 lead EKG as tachycardia still persists.  8. Vitamin D Deficiency: Although this is a chronic problem and managed best in the out patient setting, it may be of some importance to supplement in light of his history of osteonecrosis and the severe pain hopefully represents only an infarct and not infection. 9. Anemia: Pt has a baseline Hb of 9.7. His Hb had decrease 2 grams which is expected during crisis. Will continue to monitor. 10. Psychosocial: Per nursing staff, Pt has made inappropriate innuendos toward staff and has been somewhat non-compliant. However his behavior has improved in the last 24 hour. He is to be seen by Psychiatry today. I spoke with [name] from [facility] about Mr. Niznik. Mr Soledad Gerlach reports that he has been dismissed from the practice because of threatening behaviors and inappropriate sexual advances to a Physician. [name] does not recall Psychiatric evaluation in the past. However a review of Mr. [place] records notes mention of a history of suicidal ideations. 11. Medication non-compliance: Pt reports compliance with Hydrea however it is not reflected in MCV.  12. Constipation: Discontinue Docusate and prescribe Senna-S for improved gut motility.  Code Status: Full Code Family Communication: N/A Disposition Plan: Not yet ready for discharge  [place].  Pager [phone]. If 7PM-7AM, please contact night-coverage.  04/02/2014, 10:35 AM  LOS:  3 days    Consultants:  Dr. Louretta Shorten- Psychiatry  Procedures:  None  Antibiotics:  Rocephin 2/15>>  Azithromycin 2/16 >>  HPI/Subjective: Pt looks clinically improved today. He reports that pain is now mostly localized to B/L shoulders and BLE's. He is able to move LUE against gravity and he was able t bear weight on BUE's during bed mobility. He was also able to get in and out of bed with significant effort and motivation. He rates the pain as 7/10.  Last BM yesterday. Nurses notes from last night noted.  Objective: Filed Vitals:   04/02/14 0041 04/02/14 0425 04/02/14 0551 04/02/14 1007  BP:  106/58    Pulse:  106    Temp: 99.9 F (37.7 C) 101 F (38.3 C) 100.1 F (37.8 C)   TempSrc: Oral Oral Oral   Resp: 16 20 16 12   Height:      Weight:      SpO2: 100% 100% 99% 100%   Weight change:   Intake/Output Summary (Last 24 hours) at 04/02/14 1035 Last data filed at 04/02/14 1020  Gross per 24 hour  Intake   4650 ml  Output   2550 ml  Net   2100 ml    General: Alert, awake, oriented x3, in no acute distress.  HEENT: [place]/AT PEERL, EOMI, anicteric OROPHARYNX:  Moist, No exudate/ erythema/lesions.  Heart: Regular rate and rhythm, without murmurs, rubs, gallops, PMI non-displaced, no heaves or thrills on palpation.  Lungs: Clear to auscultation, no wheezing or rhonchi noted and decreased air entry at bases. Mild increase in vocal fremitus and dulness to percussion noted at bases. Abdomen: Soft, nontender, nondistended, positive bowel sounds, no masses no hepatosplenomegaly noted.  Neuro: No focal neurological deficits noted cranial nerves II through XII grossly intact.  Strength unable to assess secondary to pain. Musculoskeletal: No warm swelling or erythema around joints, no spinal tenderness noted. Psychiatric: Patient alert and oriented x3,his affect is normal and his tone very respectful today.  Lymph node survey: No cervical axillary or inguinal lymphadenopathy  noted.   Data Reviewed: Basic Metabolic Panel:  Recent Labs Lab 03/29/14 1632 03/30/14 0818 03/31/14 0510 04/01/14 0450  NA 139 137  --  133*  K 3.4* 3.5  --  3.2*  CL 107 104  --  104  CO2 24 28  --  26  GLUCOSE 98 91  --  116*  BUN <5* 8  --  9  CREATININE 0.58 0.58  --  0.50  CALCIUM 9.1 8.9  --  8.0*  MG  --   --  1.8  --    Liver Function Tests:  Recent Labs Lab 03/29/14 1632 03/30/14 0818  AST 28 30  ALT 12 12  ALKPHOS 73 84  BILITOT 3.8* 5.7*  PROT 7.6 7.7  ALBUMIN 4.2 4.2   No results for input(s): LIPASE, AMYLASE in the last 168 hours. No results for input(s): AMMONIA in the last 168 hours. CBC:  Recent Labs Lab 03/29/14 1632 03/30/14 0818 03/31/14 0510 04/01/14 0450  WBC 11.0* 13.4* 18.8* 18.7*  NEUTROABS 7.7 9.1* 14.1* 14.6*  HGB 9.4* 9.4* 9.0* 7.7*  HCT 27.3* 27.0* 25.8* 22.4*  MCV 84.8 87.9 87.2 86.8  PLT 511* 477* 375 296   Cardiac Enzymes:  Recent Labs Lab 03/30/14 0818  TROPONINI <  0.03   BNP (last 3 results) No results for input(s): BNP in the last 8760 hours.  ProBNP (last 3 results) No results for input(s): PROBNP in the last 8760 hours.  CBG: No results for input(s): GLUCAP in the last 168 hours.  Recent Results (from the past 240 hour(s))  Blood culture (routine x 2)     Status: None (Preliminary result)   Collection Time: 03/31/14  5:10 AM  Result Value Ref Range Status   Specimen Description BLOOD RIGHT ARM  Final   Special Requests BOTTLES DRAWN AEROBIC AND ANAEROBIC 4CC  Final   Culture   Final           BLOOD CULTURE RECEIVED NO GROWTH TO DATE CULTURE WILL BE HELD FOR 5 DAYS BEFORE ISSUING A FINAL NEGATIVE REPORT Performed at [facility]    Report Status PENDING  Incomplete  Culture, blood (routine x 2)     Status: None (Preliminary result)   Collection Time: 03/31/14 11:47 AM  Result Value Ref Range Status   Specimen Description BLOOD LEFT HAND  Final   Special Requests BOTTLES DRAWN AEROBIC ONLY 5CC   Final   Culture   Final           BLOOD CULTURE RECEIVED NO GROWTH TO DATE CULTURE WILL BE HELD FOR 5 DAYS BEFORE ISSUING A FINAL NEGATIVE REPORT Performed at [facility]    Report Status PENDING  Incomplete     Studies: Dg Chest 2 View  03/30/2014   CLINICAL DATA:  Sickle cell pain crisis. Diffuse pain. No shortness of breath or cough.  EXAM: CHEST  2 VIEW  COMPARISON:  Chest radiograph 10/28/2013.  FINDINGS: Stable prominent cardiac and mediastinal contours. Unchanged peribronchial thickening. No large consolidative pulmonary opacity. No pleural effusion or pneumothorax. Vertebral body changes compatible with history of sickle cell disease.  IMPRESSION: No acute cardiopulmonary process.   Electronically Signed   By: Lovey Newcomer M.D.   On: 03/30/2014 08:20   Ct Angio Chest Pe W/cm &/or Wo Cm  04/01/2014   CLINICAL DATA:  Hypoxia, tachycardia, hx of sickle cell dz, ? PE  EXAM: CT ANGIOGRAPHY CHEST WITH CONTRAST  TECHNIQUE: Multidetector CT imaging of the chest was performed using the standard protocol during bolus administration of intravenous contrast. Multiplanar CT image reconstructions and MIPs were obtained to evaluate the vascular anatomy.  CONTRAST:  156mL OMNIPAQUE IOHEXOL 350 MG/ML SOLN  COMPARISON:  10/27/2010  FINDINGS: The SVC is patent. The right atrium is nondilated. RV/LV ratio normal, less than 1. Satisfactory opacification of pulmonary arteries noted, and there is no evidence of pulmonary emboli. Patent superior and inferior pulmonary veins bilaterally. Adequate contrast opacification of the thoracic aorta with no evidence of dissection, aneurysm, or stenosis. There is classic 3-vessel brachiocephalic arch anatomy without proximal stenosis.  Small bilateral pleural effusions. No pericardial effusion. No hilar or mediastinal adenopathy. Subpleural patchy atelectasis or scarring posteriorly in both lower lobes right greater than left. Sclerosis in multiple mid and lower thoracic  vertebral segments with endplate changes characteristic of sickle cell disease. Sternum intact. Small calcified spleen. Remainder visualized upper abdomen unremarkable.  Review of the MIP images confirms the above findings.  IMPRESSION: 1. Negative for acute PE or thoracic aortic dissection. 2. Small pleural effusions with adjacent consolidation/atelectasis posteriorly in the lower lobes, right greater than left. 3. Thoracic spine and splenic changes characteristic of sickle cell disease.   Electronically Signed   By: Lucrezia Europe M.D.   On: 04/01/2014  13:08    Scheduled Meds: . antiseptic oral rinse  7 mL Mouth Rinse BID  . azithromycin  500 mg Intravenous Q24H  . cefTRIAXone (ROCEPHIN)  IV  1 g Intravenous Daily  . enoxaparin (LOVENOX) injection  40 mg Subcutaneous Q24H  . escitalopram  10 mg Oral Daily  . feeding supplement (ENSURE COMPLETE)  237 mL Oral BID BM  . folic acid  1 mg Oral Daily  . HYDROmorphone PCA 2 mg/mL   Intravenous 6 times per day  . pneumococcal 23 valent vaccine  0.5 mL Intramuscular Tomorrow-1000  . senna-docusate  1 tablet Oral BID   Continuous Infusions: . 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

## 2014-04-02 NOTE — Consult Note (Signed)
[facility] Face-to-Face Psychiatry Consult   Reason for Consult:  Manic, hypersexual and inappropriate behaviors and cannabis abuse Referring Physician:  Kalman Shan, MD  Patient Identification: Timothy Marks MRN:  [id number] Principal Diagnosis: Sickle cell pain crisis, Maj. 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 Vitamin D deficiency [E55.9]   . Sickle cell pain crisis [D57.00] 03/30/2014  . Sickle cell anemia [D57.1] 03/30/2014  . Hyperbilirubinemia [E80.6] 03/30/2014  . Hypokalemia [E87.6] 02/07/2014  . Sickle cell crisis [D57.00] 03/03/2013  . Aggressive behavior [F60.89] 01/25/2013    Total Time spent with patient: 45 minutes  Subjective:   Timothy Marks is a 24 y.o. male patient admitted with sickle cell crisis.  HPI: [name] is a 24 y.o. male seen, chart reviewed and case discussed with Sindy Messing LCSW and staff RN. Reportedly patient has been suffering with sickle cell disease and frequent sickle cell crisis since he was 24 years old. Patient reported he has been depressed, tearful, disturbance of sleep and appetite, somewhat isolated feels like no help and feels somewhat hopeless to get better. Patient is hoping to have cure for sickle cell disease. Patient reported he was stayed that is going to die because one of his friend or family who has sickle cell disease and died at age 96. Patient is also has a cousin who has sickle cell disease and going through the same problems. Patient denies being manic or psychotic or suicidal/homicidal ideation, intention or plans. Patient has no evidence of stated that he has a 18 years old son and send some money from his disability check to support him. Patient want to be alive to see his son growing. Patient lives with his mother and stepdad in [place]. Patient see sickle cell  clinic in [place]. Patient contract for safety and willing to cooperate with his medication management during this hospitalization. Patient endorses being frustrated and upset. Patient also reportedly cursing himself and stepfather people. Patient denies cursing staff members on the unit. Patient did remember being admitted to [facility] ago but does not remember complete details and possible medication management. Patient reportedly not followed with outpatient psychiatric services after admitted to hospital. Reportedly patient graduated from the [employer] high school.  Interim History: Case discussed with staff RN, Dr. Zigmund Daniel, LCSW and seen patient face to face for this evaluation. Patient has been grumpy and asked to talk to the staff RN who is mean and cursing at him two nights ago. He denied being abusive and defiant to the staff. Patient states that his mother and grandma never let him to be disrespectful to others and he was never been involved with gang members or activities. He is able simply provide information about his multiple tatoo on his both arms, fore arms and shoulder. He denied SI/HI and psychosis. Patient has been using his pain medication pump appropriately. At one time he appeared sleepy after self injecting his pain medication which was allowed as per the physician orders.   Medical history: Patient with a past medical history of sickle cell disease, anemia, hyperbilirubinemia, who has had multiple visits to the emergency department, both here in [place], as well as in [place]. He comes in with the above-mentioned complaints. This has been ongoing for 2 days. He was seen in the emergency department last night and was treated with pain medications. He was discharged  in satisfactory condition. But the pain recurred this morning. He decided to come back. Pain is sharp pain 9 out of 10 in intensity and feels like his usual sickle cell pain. He  denies any fever, chills, no diarrhea, no nausea, vomiting. Denies any falls or injuries recently. Urinating without difficulties. Is able to ambulate HPI Elements:   Location:  Depression. Quality:  Poor. Severity:  Tearful and hopeless. Timing:  Sickle cell crisis. Duration:  Few weeks. Context:  Unknown psychosocial stressors.  Past Medical History:  Past Medical History  Diagnosis Date  . Sickle cell anemia   . 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 Vitamin D deficiency y-6    Past Surgical History  Procedure Laterality Date  . Cholecystectomy     Family History: History reviewed. No pertinent family history. Social History:  History  Alcohol Use No     History  Drug Use No    History   Social History  . Marital Status: Single    Spouse Name: N/A  . Number of Children: N/A  . Years of Education: N/A   Social History Main Topics  . Smoking status: Never Smoker   . Smokeless tobacco: Never Used  . Alcohol Use: No  . Drug Use: No  . Sexual Activity: Not on file   Other Topics Concern  . None   Social History Narrative   Additional Social History:                          Allergies:   Allergies  Allergen Reactions  . Morphine And Related Hives  . Toradol [Ketorolac Tromethamine] Itching  . Tramadol Hives  . Vancomycin Itching  . Zosyn [Piperacillin Sod-Tazobactam So] Itching and Rash    Vitals: Blood pressure 103/60, pulse 93, temperature 99.1 F (37.3 C), temperature source Oral, resp. rate 16, height 6\' 2"  (1.88 m), weight 61.236 kg (135 lb), SpO2 96 %.  Risk to Self: Is patient at risk for suicide?: No Risk to Others:   Prior Inpatient Therapy:   Prior Outpatient Therapy:    Current Facility-Administered Medications  Medication Dose Route Frequency Provider Last Rate Last Dose  . acetaminophen (TYLENOL) tablet 650 mg  650 mg Oral Q4H PRN Rhetta Mura Schorr, NP      . antiseptic  oral rinse (CPC / CETYLPYRIDINIUM CHLORIDE 0.05%) solution 7 mL  7 mL Mouth Rinse BID Bonnielee Haff, MD   7 mL at 03/30/14 2200  . azithromycin (ZITHROMAX) 500 mg in dextrose 5 % 250 mL IVPB  500 mg Intravenous Q24H Leana Gamer, MD   500 mg at 04/01/14 1753  . cefTRIAXone (ROCEPHIN) 1 g in dextrose 5 % 50 mL IVPB - Premix  1 g Intravenous Daily Olabunmi Agboola, MD   1 g at 04/02/14 1009  . dextrose 5 %-0.45 % sodium chloride infusion   Intravenous Continuous Olabunmi Agboola, MD 100 mL/hr at 04/01/14 0930    . diphenhydrAMINE (BENADRYL) capsule 25-50 mg  25-50 mg Oral Q6H PRN Leana Gamer, MD       Or  . diphenhydrAMINE (BENADRYL) 25 mg in sodium chloride 0.9 % 50 mL IVPB  25 mg Intravenous Q4H PRN Leana Gamer, MD      . enoxaparin (LOVENOX) injection 40 mg  40 mg Subcutaneous Q24H Bonnielee Haff, MD   40 mg at 04/02/14 1306  . escitalopram (LEXAPRO) tablet 10 mg  10 mg Oral Daily Durward Parcel, MD   10 mg at 04/02/14 1011  . feeding supplement (ENSURE COMPLETE) (ENSURE COMPLETE) liquid 237 mL  237 mL Oral BID BM Bonnielee Haff, MD   237 mL at 04/02/14 1306  . folic acid (FOLVITE) tablet 1 mg  1 mg Oral Daily Bonnielee Haff, MD   1 mg at 04/02/14 1011  . HYDROmorphone (DILAUDID) 2 mg/ml High Concentration PCA injection   Intravenous 6 times per day Kalman Shan, MD      . HYDROmorphone (DILAUDID) injection 2 mg  2 mg Intravenous Q3H PRN Leana Gamer, MD      . lactose free nutrition (BOOST PLUS) liquid 237 mL  237 mL Oral TID WC Leana Gamer, MD   237 mL at 04/02/14 1307  . naloxone [facility]) injection 0.4 mg  0.4 mg Intravenous PRN Bonnielee Haff, MD       And  . sodium chloride 0.9 % injection 9 mL  9 mL Intravenous PRN Bonnielee Haff, MD      . ondansetron (ZOFRAN) tablet 4 mg  4 mg Oral Q4H PRN Bonnielee Haff, MD       Or  . ondansetron (ZOFRAN) injection 4 mg  4 mg Intravenous Q4H PRN Bonnielee Haff, MD   4 mg at 03/31/14 0258  .  pneumococcal 23 valent vaccine (PNU-IMMUNE) injection 0.5 mL  0.5 mL Intramuscular Tomorrow-1000 Bonnielee Haff, MD   0.5 mL at 04/01/14 2323  . polyethylene glycol (MIRALAX / GLYCOLAX) packet 17 g  17 g Oral Daily PRN Bonnielee Haff, MD      . senna-docusate (Senokot-S) tablet 1 tablet  1 tablet Oral BID Leana Gamer, MD   1 tablet at 04/02/14 1011    Musculoskeletal: Strength & Muscle Tone: within normal limits Gait & Station: unable to stand Patient leans: N/A  Psychiatric Specialty Exam: Physical Exam as per history and physical   ROS multiple body pains, depression, dysphoria and disturbance of sleep and appetite.   Blood pressure 103/60, pulse 93, temperature 99.1 F (37.3 C), temperature source Oral, resp. rate 16, height 6\' 2"  (1.88 m), weight 61.236 kg (135 lb), SpO2 96 %.Body mass index is 17.33 kg/(m^2).  General Appearance: Casual  Eye Contact::  Good  Speech:  Clear and Coherent  Volume:  Normal  Mood:  Angry, Depressed, Dysphoric and Hopeless  Affect:  Depressed and Tearful  Thought Process:  Coherent and Goal Directed  Orientation:  Full (Time, Place, and Person)  Thought Content:  WDL and Rumination  Suicidal Thoughts:  No  Homicidal Thoughts:  No  Memory:  Immediate;   Fair Recent;   Fair  Judgement:  Fair  Insight:  Fair  Psychomotor Activity:  Decreased  Concentration:  Good  Recall:  Good  Fund of Knowledge:Good  Language: Good  Akathisia:  NA  Handed:  Right  AIMS (if indicated):     Assets:  Communication Skills Desire for Improvement Financial Resources/Insurance Housing Intimacy Leisure Time Resilience Social Support  ADL's:  Impaired  Cognition: WNL  Sleep:      Medical Decision Making: Review of Psycho-Social Stressors (1), Review or order clinical lab tests (1), Review and summation of old records (2), Established Problem, Worsening (2), New Problem, with no additional work-up planned (3), Review or order medicine tests (1), Review  of Medication Regimen & Side Effects (2) and Review of New Medication or Change in Dosage (2)  Treatment Plan Summary: Daily contact with patient to assess and  evaluate symptoms and progress in treatment and Medication management  Plan:  Start Hydroxyzine 50 mg PO Qhs for anxiety and insomnia Continue Lexapro 10 mg daily for depression Patient does not meet criteria for psychiatric inpatient admission. Supportive therapy provided about ongoing stressors.  Disposition: Patient may be able to the chest home with the outpatient psychiatric services and medically stable.  Aarnav Steagall,JANARDHAHA R. 04/02/2014 2:27 PM

## 2014-04-02 NOTE — Plan of Care (Signed)
Problem: Phase I Progression Outcomes Goal: Bowel Movement At Least Every 3 Days Outcome: Progressing Pt has order for senna

## 2014-04-02 NOTE — Progress Notes (Addendum)
Has been very irritable/rude/verbally abusive  most of shift.  Is frequently talking out loud as if he is in a different place in a different situation (ie: in a [employer] with his girlfriend who has lost her purse or selling his dog, looking for a job as if he is in an interview, thinks he is going to jail or talking to his parole officer)  but when interrupted and asked orientation questions he is able to answer correctly and isn't aware that he is talking out loud.  At times it seems as if he is asleep and talking loudly in his sleep.  At others it seems as if he is talking on cell phone but when observed he is NOT on the phone.  Reported elevated temp of 101.0 to K Schorr when occurred and she stated not to get repeat [facility] but do collect UA and sputum.

## 2014-04-03 ENCOUNTER — Other Ambulatory Visit: Payer: Self-pay | Admitting: Internal Medicine

## 2014-04-03 DIAGNOSIS — F332 Major depressive disorder, recurrent severe without psychotic features: Secondary | ICD-10-CM | POA: Diagnosis present

## 2014-04-03 LAB — BASIC METABOLIC PANEL
Anion gap: 8 (ref 5–15)
BUN: 5 mg/dL — ABNORMAL LOW (ref 6–23)
CO2: 29 mmol/L (ref 19–32)
Calcium: 8.4 mg/dL (ref 8.4–10.5)
Chloride: 99 mmol/L (ref 96–112)
Creatinine, Ser: 0.44 mg/dL — ABNORMAL LOW (ref 0.50–1.35)
GFR calc Af Amer: >90 mL/min (ref >90)
GFR calc non Af Amer: >90 mL/min (ref >90)
Glucose, Bld: 115 mg/dL — ABNORMAL HIGH (ref 70–99)
Potassium: 4 mmol/L (ref 3.5–5.1)
Sodium: 136 mmol/L (ref 135–145)

## 2014-04-03 LAB — CBC WITH DIFFERENTIAL/PLATELET
Basophils Absolute: 0 10*3/uL (ref 0.0–0.1)
Basophils Relative: 0 % (ref 0–1)
Eosinophils Absolute: 0.1 10*3/uL (ref 0.0–0.7)
Eosinophils Relative: 1 % (ref 0–5)
HCT: 18.3 % — ABNORMAL LOW (ref 39.0–52.0)
Hemoglobin: 6.3 g/dL — CL (ref 13.0–17.0)
Lymphocytes Relative: 17 % (ref 12–46)
Lymphs Abs: 2 10*3/uL (ref 0.7–4.0)
MCH: 28.9 pg (ref 26.0–34.0)
MCHC: 34.4 g/dL (ref 30.0–36.0)
MCV: 83.9 fL (ref 78.0–100.0)
Monocytes Absolute: 1.6 10*3/uL — ABNORMAL HIGH (ref 0.1–1.0)
Monocytes Relative: 14 % — ABNORMAL HIGH (ref 3–12)
Neutro Abs: 8.1 10*3/uL — ABNORMAL HIGH (ref 1.7–7.7)
Neutrophils Relative %: 69 % (ref 43–77)
Platelets: 292 10*3/uL (ref 150–400)
RBC: 2.18 MIL/uL — ABNORMAL LOW (ref 4.22–5.81)
RDW: 20.3 % — ABNORMAL HIGH (ref 11.5–15.5)
WBC: 11.8 10*3/uL — ABNORMAL HIGH (ref 4.0–10.5)

## 2014-04-03 LAB — HEMOGLOBINOPATHY EVALUATION
Hgb A2 Quant: 5.2 % — ABNORMAL HIGH (ref 0.7–3.1)
Hgb A: 0 % — ABNORMAL LOW (ref 94.0–98.0)
Hgb C: 0 %
Hgb F Quant: 6.2 % — ABNORMAL HIGH (ref 0.0–2.0)
Hgb S Quant: 88.6 % — ABNORMAL HIGH

## 2014-04-03 LAB — RETICULOCYTES
RBC.: 2.18 MIL/uL — ABNORMAL LOW (ref 4.22–5.81)
Retic Count, Absolute: 189.7 10*3/uL — ABNORMAL HIGH (ref 19.0–186.0)
Retic Ct Pct: 8.7 % — ABNORMAL HIGH (ref 0.4–3.1)

## 2014-04-03 MED ORDER — VITAMIN D3 25 MCG (1000 UNIT) PO TABS
2000.0000 [IU] | ORAL_TABLET | Freq: Every day | ORAL | Status: DC
  Administered 2014-04-04 – 2014-04-09 (×6): 2000 [IU] via ORAL
  Filled 2014-04-03 (×12): qty 2

## 2014-04-03 MED ORDER — ENSURE COMPLETE PO LIQD
237.0000 mL | Freq: Three times a day (TID) | ORAL | Status: DC
  Administered 2014-04-03 – 2014-04-09 (×17): 237 mL via ORAL

## 2014-04-03 NOTE — Progress Notes (Signed)
Assumed care of pt at this time. Pt is stable and has PCA for pain control. Pt resting comfortably at this time. Will continue to monitor pt. No changes from AM assessment. Othella Boyer [facility]

## 2014-04-03 NOTE — Evaluation (Signed)
Physical Therapy One Time Evaluation Patient Details Name: Timothy Marks MRN: [id number] DOB: 08/01/1990 Today's Date: 04/03/2014   History of Present Illness  Pt is a 24 year old male admitted for managament of sickle cell crisis with hx of L shoulder osteonecrosis.  Clinical Impression  Patient evaluated by Physical Therapy with no further acute PT needs identified.  Pt reluctant to mobilize at first however then agreeable to standing EOB.  Pt required over 10 minutes to initiate activity, due to texting on phone, c/o L LE shaking, adjusting heating pad however finally then ready to stand.  Pt able to march in place EOB as well however declined ambulation at this time.  Pt did not require any assistance for mobility however frequently requested assist for moving objects, untangling lines using his own way.  Pt reports L hip pain with activity however when questioned about using assistive device he stated he never used RW or cane because he is "not old and crippled", also refusing to use and stated, "so don't ask me that."  Upon returning to bed, pt spent over 10 minutes "straightening" his lines however upon PT attempting to leave room, pt would request assist with a line or moving a pillow.  Pt currently supervision level due to use of PCA however likely will return to baseline upon d/c.  No further needs identified at this time.  Recommend nursing staff mobilize pt as tolerated as it appears he prefers things done a certain way and at his pace. PT is signing off. Thank you for this referral.     Follow Up Recommendations No PT follow up    Equipment Recommendations  None recommended by PT    Recommendations for Other Services       Precautions / Restrictions Precautions Precautions: Fall      Mobility  Bed Mobility Overal bed mobility: Modified Independent                Transfers Overall transfer level: Needs assistance Equipment used: None Transfers: Sit to/from  Stand Sit to Stand: Supervision         General transfer comment: only for safety, pt using PCA, pt able to march in place approx 3 minutes before fatigue and back to bed  Ambulation/Gait Ambulation/Gait assistance:  (Pt declined)              Stairs            Wheelchair Mobility    Modified Rankin (Stroke Patients Only)       Balance                                             Pertinent Vitals/Pain Pain Assessment: 0-10 Pain Score: 10-Worst pain ever Pain Location: L shoulder, L hip Pain Descriptors / Indicators: Grimacing;Guarding;Moaning;Aching;Sore Pain Intervention(s): Monitored during session;Repositioned;PCA encouraged    Home Living Family/patient expects to be discharged to:: Private residence Living Arrangements: Parent;Children;Spouse/significant other             Home Equipment: None      Prior Function Level of Independence: Independent               Hand Dominance        Extremity/Trunk Assessment   Upper Extremity Assessment: LUE deficits/detail       LUE Deficits / Details: limited AROM of L UE due to shoulder pain  Lower Extremity Assessment: Overall WFL for tasks assessed (c/o L LE twitching, appeared self created as would start with pt verbalizing then disappear with pt distraction)         Communication      Cognition Arousal/Alertness: Awake/alert Behavior During Therapy: WFL for tasks assessed/performed Overall Cognitive Status: Within Functional Limits for tasks assessed                      General Comments      Exercises        Assessment/Plan    PT Assessment Patent does not need any further PT services  PT Diagnosis     PT Problem List    PT Treatment Interventions     PT Goals (Current goals can be found in the Care Plan section) Acute Rehab PT Goals PT Goal Formulation: All assessment and education complete, DC therapy    Frequency     Barriers to  discharge        Co-evaluation               End of Session   Activity Tolerance: Patient tolerated treatment well Patient left: in bed;with call bell/phone within reach Nurse Communication: Mobility status (called to set bed alarm )         Time: 3159-4585 PT Time Calculation (min) (ACUTE ONLY): 37 min   Charges:   PT Evaluation $Initial PT Evaluation Tier I: 1 Procedure     PT G Codes:        Gevork Ayyad,KATHrine E 04/03/2014, 2:59 PM Carmelia Bake, PT, DPT 04/03/2014 Pager: [phone]

## 2014-04-03 NOTE — Progress Notes (Signed)
Clinical Social Work  CSW went to room in order to follow up with patient. Patient sleeping but CSW spoke with bedside RN who reports patient has been compliant and no behaviors overnight. CSW will follow up at later time to visit with patient.  [employer], [place] [phone]

## 2014-04-03 NOTE — Consult Note (Signed)
Psychiatry Consult Follow up  Reason for Consult:  Manic, hypersexual and inappropriate behaviors and cannabis abuse Referring Physician:  Leana Gamer, MD  Patient Identification: Timothy Marks MRN:  [id number] Principal Diagnosis: Sickle cell pain crisis, Maj. 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 Vitamin D deficiency [E55.9]   . Sickle cell pain crisis [D57.00] 03/30/2014  . Sickle cell anemia [D57.1] 03/30/2014  . Hyperbilirubinemia [E80.6] 03/30/2014  . Hypokalemia [E87.6] 02/07/2014  . Sickle cell crisis [D57.00] 03/03/2013  . Aggressive behavior [F60.89] 01/25/2013    Total Time spent with patient: 15 minutes  Subjective:   Timothy Marks is a 24 y.o. male patient admitted with sickle cell crisis.  HPI: Timothy Marks is a 24 y.o. male seen, chart reviewed and case discussed with Sindy Messing LCSW and staff RN. Reportedly patient has been suffering with sickle cell disease and frequent sickle cell crisis since he was 24 years old. Patient reported he has been depressed, tearful, disturbance of sleep and appetite, somewhat isolated feels like no help and feels somewhat hopeless to get better. Patient is hoping to have cure for sickle cell disease. Patient reported he was stayed that is going to die because one of his friend or family who has sickle cell disease and died at age 60. Patient is also has a cousin who has sickle cell disease and going through the same problems. Patient denies being manic or psychotic or suicidal/homicidal ideation, intention or plans. Patient has no evidence of stated that he has a 6 years old son and send some money from his disability check to support him. Patient want to be alive to see his son growing. Patient lives with his mother and stepdad in [place]. Patient see sickle cell  clinic in [place]. Patient contract for safety and willing to cooperate with his medication management during this hospitalization. Patient endorses being frustrated and upset. Patient also reportedly cursing himself and stepfather people. Patient denies cursing staff members on the unit. Patient did remember being admitted to behavioral [place] sometime ago but does not remember complete details and possible medication management. Patient reportedly not followed with outpatient psychiatric services after admitted to hospital. Reportedly patient graduated from the [employer] high school.  Interim History: Patient seen in his room sleeping and some what snoring. Spoke with staff RN and psych social service, reportedly no known behavioral or emotional problems since yesterday. He seems to be behaving appropriate and has fairly good interactions with staff. He was found occasionally talking to himself but does not show irritability, agitation or aggression. He is compliant with his treatment including medication management.   Medical history: Patient with a past medical history of sickle cell disease, anemia, hyperbilirubinemia, who has had multiple visits to the emergency department, both here in [place], as well as in [place]. He comes in with the above-mentioned complaints. This has been ongoing for 2 days. He was seen in the emergency department last night and was treated with pain medications. He was discharged in satisfactory condition. But the pain recurred this morning. He decided to come back. Pain is sharp pain 9 out of 10 in intensity and feels like his usual sickle cell pain. He denies any fever, chills, no diarrhea, no nausea, vomiting. Denies any falls or injuries recently. Urinating without difficulties. Is able to ambulate  Past Medical History:  Past Medical  History  Diagnosis Date  . Sickle cell anemia   . 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 Vitamin D deficiency y-6    Past Surgical History  Procedure Laterality Date  . Cholecystectomy     Family History: History reviewed. No pertinent family history. Social History:  History  Alcohol Use No     History  Drug Use No    History   Social History  . Marital Status: Single    Spouse Name: N/A  . Number of Children: N/A  . Years of Education: N/A   Social History Main Topics  . Smoking status: Never Smoker   . Smokeless tobacco: Never Used  . Alcohol Use: No  . Drug Use: No  . Sexual Activity: Not on file   Other Topics Concern  . None   Social History Narrative   Additional Social History:     Allergies:   Allergies  Allergen Reactions  . Morphine And Related Hives  . Toradol [Ketorolac Tromethamine] Itching  . Tramadol Hives  . Vancomycin Itching  . Zosyn [Piperacillin Sod-Tazobactam So] Itching and Rash    Vitals: Blood pressure 109/62, pulse 103, temperature 99.4 F (37.4 C), temperature source Oral, resp. rate 11, height 6\' 2"  (1.88 m), weight 61.236 kg (135 lb), SpO2 100 %.  Risk to Self: Is patient at risk for suicide?: No Risk to Others:   Prior Inpatient Therapy:   Prior Outpatient Therapy:    Current Facility-Administered Medications  Medication Dose Route Frequency Provider Last Rate Last Dose  . acetaminophen (TYLENOL) tablet 650 mg  650 mg Oral Q4H PRN Rhetta Mura Schorr, NP   650 mg at 04/03/14 0559  . antiseptic oral rinse (CPC / CETYLPYRIDINIUM CHLORIDE 0.05%) solution 7 mL  7 mL Mouth Rinse BID Bonnielee Haff, MD   7 mL at 04/03/14 1035  . azithromycin (ZITHROMAX) 500 mg in dextrose 5 % 250 mL IVPB  500 mg Intravenous Q24H Leana Gamer, MD   500 mg at 04/02/14 1525  . cefTRIAXone (ROCEPHIN) 1 g in dextrose 5 % 50 mL IVPB - Premix  1 g Intravenous Daily Olabunmi Agboola, MD   1 g at 04/03/14 1038  . dextrose 5 %-0.45 % sodium chloride infusion   Intravenous Continuous Kalman Shan, MD 100 mL/hr at 04/03/14 0349    . diphenhydrAMINE (BENADRYL) capsule 25-50 mg  25-50 mg Oral Q6H PRN Leana Gamer, MD       Or  . diphenhydrAMINE (BENADRYL) 25 mg in sodium chloride 0.9 % 50 mL IVPB  25 mg Intravenous Q4H PRN Leana Gamer, MD      . enoxaparin (LOVENOX) injection 40 mg  40 mg Subcutaneous Q24H Bonnielee Haff, MD   40 mg at 04/02/14 1306  . escitalopram (LEXAPRO) tablet 10 mg  10 mg Oral Daily Durward Parcel, MD   10 mg at 04/03/14 1035  . feeding supplement (ENSURE COMPLETE) (ENSURE COMPLETE) liquid 237 mL  237 mL Oral TID BM Dorann Ou, RD      . folic acid (FOLVITE) tablet 1 mg  1 mg Oral Daily Bonnielee Haff, MD   1 mg at 04/03/14 1035  . HYDROmorphone (DILAUDID) 2 mg/ml High Concentration PCA injection   Intravenous 6 times per day Kalman Shan, MD      . HYDROmorphone (DILAUDID) injection 2 mg  2 mg Intravenous Q3H PRN Leana Gamer, MD      .  hydrOXYzine (ATARAX/VISTARIL) tablet 50 mg  50 mg Oral QHS Durward Parcel, MD   50 mg at 04/02/14 2123  . lactose free nutrition (BOOST PLUS) liquid 237 mL  237 mL Oral TID WC Leana Gamer, MD   237 mL at 04/02/14 1307  . naloxone [facility]) injection 0.4 mg  0.4 mg Intravenous PRN Bonnielee Haff, MD       And  . sodium chloride 0.9 % injection 9 mL  9 mL Intravenous PRN Bonnielee Haff, MD      . ondansetron (ZOFRAN) tablet 4 mg  4 mg Oral Q4H PRN Bonnielee Haff, MD       Or  . ondansetron (ZOFRAN) injection 4 mg  4 mg Intravenous Q4H PRN Bonnielee Haff, MD   4 mg at 03/31/14 0258  . pneumococcal 23 valent vaccine (PNU-IMMUNE) injection 0.5 mL  0.5 mL Intramuscular Tomorrow-1000 Bonnielee Haff, MD   0.5 mL at 04/01/14 2323  . polyethylene glycol (MIRALAX / GLYCOLAX) packet 17 g  17 g Oral Daily PRN Bonnielee Haff, MD      . senna-docusate (Senokot-S) tablet 1 tablet  1 tablet Oral BID Leana Gamer, MD   1 tablet at 04/03/14 1035    Musculoskeletal: Strength &  Muscle Tone: within normal limits Gait & Station: unable to stand Patient leans: N/A  Psychiatric Specialty Exam: Physical Exam as per history and physical   ROS multiple body pains, depression, dysphoria and disturbance of sleep and appetite.   Blood pressure 109/62, pulse 103, temperature 99.4 F (37.4 C), temperature source Oral, resp. rate 11, height 6\' 2"  (1.88 m), weight 61.236 kg (135 lb), SpO2 100 %.Body mass index is 17.33 kg/(m^2).  General Appearance: Casual  Eye Contact::  Good  Speech:  Clear and Coherent  Volume:  Normal  Mood:  Depressed and Hopeless  Affect:  Depressed  Thought Process:  Coherent and Goal Directed  Orientation:  Full (Time, Place, and Person)  Thought Content:  WDL, he was found talking with himself or talking with family who were not in his room.   Suicidal Thoughts:  No  Homicidal Thoughts:  No  Memory:  Immediate;   Fair Recent;   Fair  Judgement:  Fair  Insight:  Fair  Psychomotor Activity:  Decreased  Concentration:  Good  Recall:  Good  Fund of Knowledge:Good  Language: Good  Akathisia:  NA  Handed:  Right  AIMS (if indicated):     Assets:  Communication Skills Desire for Improvement Financial Resources/Insurance Housing Intimacy Leisure Time Resilience Social Support  ADL's:  Impaired  Cognition: WNL  Sleep:      Medical Decision Making: Review of Psycho-Social Stressors (1), Review or order clinical lab tests (1), Review and summation of old records (2), Established Problem, Worsening (2), New Problem, with no additional work-up planned (3), Review or order medicine tests (1), Review of Medication Regimen & Side Effects (2) and Review of New Medication or Change in Dosage (2)  Treatment Plan Summary: Daily contact with patient to assess and evaluate symptoms and progress in treatment and Medication management  Plan:  Continue Hydroxyzine 50 mg PO Qhs for anxiety and insomnia Continue Lexapro 10 mg daily for  depression Patient does not meet criteria for psychiatric inpatient admission. Supportive therapy provided about ongoing stressors.  Disposition: Patient may be able to discharge home with the outpatient psychiatric services and medically stable.  Timothy Marks,JANARDHAHA R. 04/03/2014 11:55 AM

## 2014-04-03 NOTE — Progress Notes (Signed)
CRITICAL VALUE ALERT  Critical value received:  Hgb 6.3  Date of notification:  04/03/2014  Time of notification:  0525  Critical value read back:Yes.    Nurse who received alert:  J.Monzerat Handler  MD notified (1st page):  K.Schorr  Time of first page:  0526  MD notified (2nd page):  Time of second page:  Responding MD:  K.Schorr  Time MD responded:  [phone]

## 2014-04-03 NOTE — Progress Notes (Signed)
Subjective: A 24 year old gentleman who is new to our system here with sickle cell painful crisis. Patient has also had psychiatric issues with multiple other problems including bilateral pneumonia. He has been on Dilaudid PCA and has used 44.9 mg with 80 demands and 74 deliveries in the last 24 hours. He's has significant shortness of breath but much better now. Patient has worked with physical therapy today. He was not able to do much however. He's had issues including multiple vitamin D deficiency as well as significant malnutrition as well as underweight status. No fever today no shortness of breath no cough no nausea vomiting or diarrhea. Objective: Vital signs in last 24 hours: Temp:  [98.5 F (36.9 C)-100.4 F (38 C)] 98.5 F (36.9 C) (02/18 1258) Pulse Rate:  [88-103] 88 (02/18 1258) Resp:  [11-16] 14 (02/18 1258) BP: (109-118)/(62-63) 118/63 mmHg (02/18 1258) SpO2:  [98 %-100 %] 99 % (02/18 1258) Weight change:  Last BM Date: 04/01/14  Intake/Output from previous day: 02/17 0701 - 02/18 0700 In: 3568.3 [P.O.:1080; I.V.:2488.3] Out: 3200 [Urine:3200] Intake/Output this shift: Total I/O In: 480 [P.O.:480] Out: 725 [Urine:725]  General appearance: alert, cooperative, cachectic and no distress Head: Normocephalic, without obvious abnormality, atraumatic Eyes: conjunctivae/corneas clear. PERRL, EOM's intact. Fundi benign. Neck: no adenopathy, no carotid bruit, no JVD, supple, symmetrical, trachea midline and thyroid not enlarged, symmetric, no tenderness/mass/nodules Back: symmetric, no curvature. ROM normal. No CVA tenderness. Resp: clear to auscultation bilaterally Chest wall: no tenderness Cardio: regular rate and rhythm, S1, S2 normal, no murmur, click, rub or gallop GI: soft, non-tender; bowel sounds normal; no masses,  no organomegaly Extremities: extremities normal, atraumatic, no cyanosis or edema Pulses: 2+ and symmetric Skin: Skin color, texture, turgor normal. No  rashes or lesions Neurologic: Grossly normal  Lab Results:  Recent Labs  04/01/14 0450 04/03/14 0455  WBC 18.7* 11.8*  HGB 7.7* 6.3*  HCT 22.4* 18.3*  PLT 296 292   BMET  Recent Labs  04/02/14 1453 04/03/14 0455  NA 139 136  K 3.4* 4.0  CL 103 99  CO2 29 29  GLUCOSE 101* 115*  BUN 6 5*  CREATININE 0.48* 0.44*  CALCIUM 8.4 8.4    Studies/Results: No results found.  Medications: I have reviewed the patient's current medications.  Assessment/Plan: A 24 year old cachectic male with sickle cell painful crisis. Next  #1 sickle cell painful crisis: Patient is having pain in multiple locations. He seems to be slowly responding to Dilaudid PCA. He is also tolerating the Toradol as well as continue with IV hydration. Review of his previous medication shows he has been getting pain medications at [facility] and [facility]. We will continue current regimen therefore.  #2 hypoxemia: Most likely second to bilateral pneumonia. This is improving with antibiotics as well as oxygen. Next  #3 bilateral pneumonia: Patient is being treated for community-acquired pneumonia. He has no recent hospitalization. We'll continue to monitor his response to treatment. Next  #4 Malnutrition: This is moderate. It appears chronic also. He remains underweight. Continue to encourage nutrition.  #5 sickle cell anemia: His hemoglobin is down to 6.4 in the last 48 hours. He certainly had some mild hemolytic anemia. We'll try and check in the morning and see if he's dropping we'll transfuse him. Next  #6 hypokalemia: Potassium has been repleted. We'll continue to monitor.   #7 psychiatric illness: Appreciate psychiatrist input. Patient is slowly improving. Next  #8 vitamin D deficiency: I would start patient on vitamin D in the hospital. Further  treatment as an outpatient.  #9 leukocytosis: Most likely second to sickle cell disease. We will continue to monitor.  #10 constipation: Patient is currently on  senna S. We will continue.  LOS: 4 days   GARBA,LAWAL 04/03/2014, 3:07 PM

## 2014-04-03 NOTE — Progress Notes (Signed)
NUTRITION FOLLOW UP  Intervention:   - Increase Ensure to TID. - Encourage adequate PO intake - RD will continue to monitor  Nutrition Dx:   Inadequate oral intake related to pain as evidenced by pt report; ongoing  Goal:   Pt to meet >/= 90% of their estimated nutrition needs   Monitor:   PO and supplemental intake, weight, labs, I/O's  Assessment:   24 y.o. male with a past medical history of sickle cell disease, anemia, hyperbilirubinemia. Pt reports not eating and drinking well over the past few days.  - Consult received for nutrition assessment. Pt seen 2/15. - Pt asleep during RD visit.   - Per chart history, pt's weight has been stable. He reported to MD that he has always been thin. - Pt's PO intake has been varied from 0-75%. Per nurse tech, pt ate 1 and 1/2 sausage biscuits this morning for breakfast with orange juice. He is drinking Ensure supplements. Will increase Ensure to TID to improve PO intake.  - RD will continue to monitor.   Height: Ht Readings from Last 1 Encounters:  03/30/14 6\' 2"  (1.88 m)    Weight Status:   Wt Readings from Last 1 Encounters:  03/30/14 135 lb (61.236 kg)    Re-estimated needs:  Kcal: 1800-2000 Protein: 90-100 g Fluid: 1.8 L/day  Skin: intact  Diet Order: Diet regular   Intake/Output Summary (Last 24 hours) at 04/03/14 1142 Last data filed at 04/03/14 0900  Gross per 24 hour  Intake 3568.33 ml  Output   2600 ml  Net 968.33 ml    Last BM: 2/17   Labs:   Recent Labs Lab 03/31/14 0510 04/01/14 0450 04/02/14 1453 04/03/14 0455  NA  --  133* 139 136  K  --  3.2* 3.4* 4.0  CL  --  104 103 99  CO2  --  26 29 29   BUN  --  9 6 5*  CREATININE  --  0.50 0.48* 0.44*  CALCIUM  --  8.0* 8.4 8.4  MG 1.8  --   --   --   GLUCOSE  --  116* 101* 115*    CBG (last 3)  No results for input(s): GLUCAP in the last 72 hours.  Scheduled Meds: . antiseptic oral rinse  7 mL Mouth Rinse BID  . azithromycin  500 mg  Intravenous Q24H  . cefTRIAXone (ROCEPHIN)  IV  1 g Intravenous Daily  . enoxaparin (LOVENOX) injection  40 mg Subcutaneous Q24H  . escitalopram  10 mg Oral Daily  . feeding supplement (ENSURE COMPLETE)  237 mL Oral BID BM  . folic acid  1 mg Oral Daily  . HYDROmorphone PCA 2 mg/mL   Intravenous 6 times per day  . hydrOXYzine  50 mg Oral QHS  . lactose free nutrition  237 mL Oral TID WC  . pneumococcal 23 valent vaccine  0.5 mL Intramuscular Tomorrow-1000  . senna-docusate  1 tablet Oral BID    Continuous Infusions: . dextrose 5 % and 0.45% NaCl 100 mL/hr at 04/03/14 [place], [place], [place]

## 2014-04-04 LAB — COMPREHENSIVE METABOLIC PANEL
ALT: 23 U/L (ref 0–53)
AST: 33 U/L (ref 0–37)
Albumin: 3.1 g/dL — ABNORMAL LOW (ref 3.5–5.2)
Alkaline Phosphatase: 91 U/L (ref 39–117)
Anion gap: 9 (ref 5–15)
BUN: <5 mg/dL — ABNORMAL LOW (ref 6–23)
CO2: 31 mmol/L (ref 19–32)
Calcium: 8.7 mg/dL (ref 8.4–10.5)
Chloride: 98 mmol/L (ref 96–112)
Creatinine, Ser: 0.48 mg/dL — ABNORMAL LOW (ref 0.50–1.35)
GFR calc Af Amer: >90 mL/min (ref >90)
GFR calc non Af Amer: >90 mL/min (ref >90)
Glucose, Bld: 107 mg/dL — ABNORMAL HIGH (ref 70–99)
Potassium: 3.6 mmol/L (ref 3.5–5.1)
Sodium: 138 mmol/L (ref 135–145)
Total Bilirubin: 1.5 mg/dL — ABNORMAL HIGH (ref 0.3–1.2)
Total Protein: 7.3 g/dL (ref 6.0–8.3)

## 2014-04-04 LAB — CBC WITH DIFFERENTIAL/PLATELET
Basophils Absolute: 0 10*3/uL (ref 0.0–0.1)
Basophils Relative: 0 % (ref 0–1)
Eosinophils Absolute: 0.2 10*3/uL (ref 0.0–0.7)
Eosinophils Relative: 2 % (ref 0–5)
HCT: 18.8 % — ABNORMAL LOW (ref 39.0–52.0)
Hemoglobin: 6.2 g/dL — CL (ref 13.0–17.0)
Lymphocytes Relative: 22 % (ref 12–46)
Lymphs Abs: 2.3 10*3/uL (ref 0.7–4.0)
MCH: 27.2 pg (ref 26.0–34.0)
MCHC: 33 g/dL (ref 30.0–36.0)
MCV: 82.5 fL (ref 78.0–100.0)
Monocytes Absolute: 1.4 10*3/uL — ABNORMAL HIGH (ref 0.1–1.0)
Monocytes Relative: 14 % — ABNORMAL HIGH (ref 3–12)
Neutro Abs: 6.6 10*3/uL (ref 1.7–7.7)
Neutrophils Relative %: 63 % (ref 43–77)
Platelets: 368 10*3/uL (ref 150–400)
RBC: 2.28 MIL/uL — ABNORMAL LOW (ref 4.22–5.81)
RDW: 21.6 % — ABNORMAL HIGH (ref 11.5–15.5)
WBC: 10.6 10*3/uL — ABNORMAL HIGH (ref 4.0–10.5)

## 2014-04-04 LAB — EXPECTORATED SPUTUM ASSESSMENT W REFEX TO RESP CULTURE

## 2014-04-04 LAB — EXPECTORATED SPUTUM ASSESSMENT W GRAM STAIN, RFLX TO RESP C

## 2014-04-04 MED ORDER — DEXTROSE 5 % IV SOLN
1.0000 g | Freq: Three times a day (TID) | INTRAVENOUS | Status: DC
  Administered 2014-04-04 – 2014-04-08 (×13): 1 g via INTRAVENOUS
  Filled 2014-04-04 (×14): qty 1

## 2014-04-04 MED ORDER — DEXTROSE 5 % IV SOLN
2.0000 g | Freq: Three times a day (TID) | INTRAVENOUS | Status: DC
  Administered 2014-04-04: 2 g via INTRAVENOUS
  Filled 2014-04-04: qty 2

## 2014-04-04 MED ORDER — DICLOFENAC SODIUM 1 % TD GEL
2.0000 g | Freq: Four times a day (QID) | TRANSDERMAL | Status: DC
  Administered 2014-04-04 – 2014-04-09 (×15): 2 g via TOPICAL
  Filled 2014-04-04: qty 100

## 2014-04-04 MED ORDER — LINEZOLID 2 MG/ML IV SOLN
600.0000 mg | Freq: Two times a day (BID) | INTRAVENOUS | Status: DC
  Administered 2014-04-04 – 2014-04-08 (×7): 600 mg via INTRAVENOUS
  Filled 2014-04-04 (×10): qty 300

## 2014-04-04 NOTE — Progress Notes (Signed)
Pharmacy - Brief Note  Pharmacy Consult for cefepime Indication: HCAP  Please refer to pharmacist's note from earlier this morning for details.  Plan:  Change cefepime to 1gm IV q8h as patient is not neutropenic  Monitor for serotonin syndrome with zyvox and celexa  Doreene Eland, PharmD, BCPS.   Pager: [phone] 04/04/2014 7:52 AM

## 2014-04-04 NOTE — Progress Notes (Signed)
ANTIBIOTIC CONSULT NOTE - INITIAL  Pharmacy Consult for cefepime Indication: HCAP  Allergies  Allergen Reactions  . Morphine And Related Hives  . Toradol [Ketorolac Tromethamine] Itching  . Tramadol Hives  . Vancomycin Itching  . Zosyn [Piperacillin Sod-Tazobactam So] Itching and Rash    Has taken rocephin in past    Patient Measurements: Height: 6\' 2"  (188 cm) Weight: 135 lb (61.236 kg) IBW/kg (Calculated) : 82.2 Adjusted Body Weight:   Vital Signs: Temp: 99.3 F (37.4 C) (02/19 0225) Temp Source: Oral (02/19 0225) BP: 126/71 mmHg (02/19 0225) Pulse Rate: 103 (02/19 0225) Intake/Output from previous day: 02/18 0701 - 02/19 0700 In: 2801.7 [P.O.:840; I.V.:1711.7; IV Piggyback:250] Out: 2325 [Urine:2325] Intake/Output from this shift: Total I/O In: 433.3 [I.V.:433.3] Out: 1100 [Urine:1100]  Labs:  Recent Labs  04/02/14 1453 04/03/14 0455 04/04/14 0540  WBC  --  11.8* 10.6*  HGB  --  6.3* 6.2*  PLT  --  292 368  CREATININE 0.48* 0.44* 0.48*   Estimated Creatinine Clearance: 122.2 mL/min (by C-G formula based on Cr of 0.48). No results for input(s): VANCOTROUGH, VANCOPEAK, VANCORANDOM, GENTTROUGH, GENTPEAK, GENTRANDOM, TOBRATROUGH, TOBRAPEAK, TOBRARND, AMIKACINPEAK, AMIKACINTROU, AMIKACIN in the last 72 hours.   Microbiology: Recent Results (from the past 720 hour(s))  Blood culture (routine x 2)     Status: None (Preliminary result)   Collection Time: 03/31/14  5:10 AM  Result Value Ref Range Status   Specimen Description BLOOD RIGHT ARM  Final   Special Requests BOTTLES DRAWN AEROBIC AND ANAEROBIC 4CC  Final   Culture   Final           BLOOD CULTURE RECEIVED NO GROWTH TO DATE CULTURE WILL BE HELD FOR 5 DAYS BEFORE ISSUING A FINAL NEGATIVE REPORT Performed at [facility]    Report Status PENDING  Incomplete  Culture, blood (routine x 2)     Status: None (Preliminary result)   Collection Time: 03/31/14 11:47 AM  Result Value Ref Range Status   Specimen Description BLOOD LEFT HAND  Final   Special Requests BOTTLES DRAWN AEROBIC ONLY 5CC  Final   Culture   Final           BLOOD CULTURE RECEIVED NO GROWTH TO DATE CULTURE WILL BE HELD FOR 5 DAYS BEFORE ISSUING A FINAL NEGATIVE REPORT Performed at [facility]    Report Status PENDING  Incomplete    Medical History: Past Medical History  Diagnosis Date  . Sickle cell anemia   . 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 Vitamin D deficiency y-6    Medications:  Anti-infectives    Start     Dose/Rate Route Frequency Ordered Stop   04/04/14 0630  linezolid (ZYVOX) IVPB 600 mg     600 mg 300 mL/hr over 60 Minutes Intravenous 2 times daily 04/04/14 0611     04/04/14 0630  ceFEPIme (MAXIPIME) 2 g in dextrose 5 % 50 mL IVPB     2 g 100 mL/hr over 30 Minutes Intravenous 3 times per day 04/04/14 0615     04/01/14 1600  azithromycin (ZITHROMAX) 500 mg in dextrose 5 % 250 mL IVPB  Status:  Discontinued     500 mg 250 mL/hr over 60 Minutes Intravenous Every 24 hours 04/01/14 1356 04/04/14 0601   03/31/14 1300  cefTRIAXone (ROCEPHIN) 1 g in dextrose 5 % 50 mL IVPB - Premix  Status:  Discontinued     1  g 100 mL/hr over 30 Minutes Intravenous Daily 03/31/14 1214 04/04/14 0601     Assessment: A 24 year old gentleman who is new to our system here with sickle cell painful crisis. Patient has also had psychiatric issues with multiple other problems including bilateral pneumonia--spiked temp, now zyvox and cefepime per pharmacy.  Goal of Therapy:  Cefepime dosed based on patient weight and renal function   Plan:  Follow up culture results  Cefepime 2gm iv q8hr  Tyler Deis, Mariane Burpee Crowford 04/04/2014,6:38 AM

## 2014-04-04 NOTE — Progress Notes (Signed)
Subjective: Patient still spiking fevers overnight despite being on Rocephine and Zithromax. He also has pain now at 8/10. No significant cough or SOB. Has been unable to sleep overnight due to soreness in his back around the lateral rib cages. He is on Dilaudid PCA and has used 39 mg with 59 demands and 52 deliveries in the last 24 hours. No new evidence of psychosis.   Objective: Vital signs in last 24 hours: Temp:  [98.5 F (36.9 C)-101.3 F (38.5 C)] 99.1 F (37.3 C) (02/19 0600) Pulse Rate:  [88-103] 89 (02/19 0600) Resp:  [11-16] 16 (02/19 1221) BP: (117-126)/(61-71) 117/61 mmHg (02/19 0600) SpO2:  [99 %-100 %] 100 % (02/19 1221) Weight change:  Last BM Date:  (PTA)  Intake/Output from previous day: 02/18 0701 - 02/19 0700 In: 2801.7 [P.O.:840; I.V.:1711.7; IV Piggyback:250] Out: 2925 [Urine:2925] Intake/Output this shift: Total I/O In: -  Out: 400 [Urine:400]  General appearance: alert, cooperative, cachectic and no distress Head: Normocephalic, without obvious abnormality, atraumatic Eyes: conjunctivae/corneas clear. PERRL, EOM's intact. Fundi benign. Neck: no adenopathy, no carotid bruit, no JVD, supple, symmetrical, trachea midline and thyroid not enlarged, symmetric, no tenderness/mass/nodules Back: symmetric, no curvature. ROM normal. No CVA tenderness. Resp: clear to auscultation bilaterally Chest wall: no tenderness Cardio: regular rate and rhythm, S1, S2 normal, no murmur, click, rub or gallop GI: soft, non-tender; bowel sounds normal; no masses,  no organomegaly Extremities: extremities normal, atraumatic, no cyanosis or edema Pulses: 2+ and symmetric Skin: Skin color, texture, turgor normal. No rashes or lesions Neurologic: Grossly normal  Lab Results:  Recent Labs  04/03/14 0455 04/04/14 0540  WBC 11.8* 10.6*  HGB 6.3* 6.2*  HCT 18.3* 18.8*  PLT 292 368   BMET  Recent Labs  04/03/14 0455 04/04/14 0540  NA 136 138  K 4.0 3.6  CL 99 98  CO2  29 31  GLUCOSE 115* 107*  BUN 5* <5*  CREATININE 0.44* 0.48*  CALCIUM 8.4 8.7    Studies/Results: No results found.  Medications: I have reviewed the patient's current medications.  Assessment/Plan: A 24 year old cachectic male with sickle cell painful crisis. Next  #1 sickle cell painful crisis: Patient is having adequate pain medications but has superficial subcutaneous symptoms. We will continue current Dilaudid regimen but add Voltaren gel for his back/side pain. Counseled that he needs to use his PCA as instructed.  #2 hypoxemia: It seems to have resolved. Continue oxygenation and current antibiotics.  #3 bilateral pneumonia: Patient is being treated for community-acquired pneumonia. With no response adequately, will broaden his coverage by starting Linozelid and cefepime for HCAP.  #4 Malnutrition:  Continue to encourage nutrition.  #5 sickle cell anemia: His hemoglobin is stable. Continue to monitor.  #6 hypokalemia: Potassium has been repleted. We'll continue to monitor.   #7 psychiatric illness: Appreciate psychiatrist input. Patient is slowly improving. Next  #8 vitamin D deficiency: I started the patient on vitamin D in the hospital. Further treatment as an outpatient.  #9 leukocytosis: Most likely secondary to sickle cell disease. We will continue to monitor.  #10 constipation: Patient is currently on senna S. We will continue.  LOS: 5 days   GARBA,LAWAL 04/04/2014, 12:57 PM

## 2014-04-04 NOTE — Progress Notes (Signed)
Clinical Social Work  CSW spoke with bedside RN who reports no behaviors from patient today. RN reports patient continues to talk to himself at times but no inappropriate or aggressive behavior. Patient drowsy when CSW arrived. Patient reports he did not sleep well last night and continues to be in pain. Patient reports his family has been checking and calling on him so that has helped his emotional wellbeing. Patient denies any MH concerns and reports he just wants to relax. Patient minimally engaged during session and not willing to engage in conversation at this time. CSW will continue to follow to offer support during hospitalization.  [place], [place] [phone]

## 2014-04-05 LAB — CBC WITH DIFFERENTIAL/PLATELET
Basophils Absolute: 0 10*3/uL (ref 0.0–0.1)
Basophils Relative: 0 % (ref 0–1)
Eosinophils Absolute: 0.2 10*3/uL (ref 0.0–0.7)
Eosinophils Relative: 2 % (ref 0–5)
HCT: 18.4 % — ABNORMAL LOW (ref 39.0–52.0)
Hemoglobin: 6.1 g/dL — CL (ref 13.0–17.0)
Lymphocytes Relative: 17 % (ref 12–46)
Lymphs Abs: 1.8 10*3/uL (ref 0.7–4.0)
MCH: 26.9 pg (ref 26.0–34.0)
MCHC: 33.2 g/dL (ref 30.0–36.0)
MCV: 81.1 fL (ref 78.0–100.0)
Monocytes Absolute: 1.2 10*3/uL — ABNORMAL HIGH (ref 0.1–1.0)
Monocytes Relative: 12 % (ref 3–12)
Neutro Abs: 7.1 10*3/uL (ref 1.7–7.7)
Neutrophils Relative %: 69 % (ref 43–77)
Platelets: 436 10*3/uL — ABNORMAL HIGH (ref 150–400)
RBC: 2.27 MIL/uL — ABNORMAL LOW (ref 4.22–5.81)
RDW: 22.8 % — ABNORMAL HIGH (ref 11.5–15.5)
WBC: 10.3 10*3/uL (ref 4.0–10.5)

## 2014-04-05 LAB — COMPREHENSIVE METABOLIC PANEL
ALT: 22 U/L (ref 0–53)
AST: 25 U/L (ref 0–37)
Albumin: 3.3 g/dL — ABNORMAL LOW (ref 3.5–5.2)
Alkaline Phosphatase: 81 U/L (ref 39–117)
Anion gap: 7 (ref 5–15)
BUN: 6 mg/dL (ref 6–23)
CO2: 32 mmol/L (ref 19–32)
Calcium: 8.7 mg/dL (ref 8.4–10.5)
Chloride: 96 mmol/L (ref 96–112)
Creatinine, Ser: 0.45 mg/dL — ABNORMAL LOW (ref 0.50–1.35)
GFR calc Af Amer: >90 mL/min (ref >90)
GFR calc non Af Amer: >90 mL/min (ref >90)
Glucose, Bld: 106 mg/dL — ABNORMAL HIGH (ref 70–99)
Potassium: 3.6 mmol/L (ref 3.5–5.1)
Sodium: 135 mmol/L (ref 135–145)
Total Bilirubin: 1.7 mg/dL — ABNORMAL HIGH (ref 0.3–1.2)
Total Protein: 7.6 g/dL (ref 6.0–8.3)

## 2014-04-05 MED ORDER — HYDROMORPHONE HCL 4 MG PO TABS
4.0000 mg | ORAL_TABLET | ORAL | Status: DC
  Administered 2014-04-05 – 2014-04-07 (×13): 4 mg via ORAL
  Filled 2014-04-05 (×13): qty 1

## 2014-04-05 NOTE — Progress Notes (Signed)
Subjective: Patient still complaining of pain all over.  He's also had issues with behavior today total was the nurses. He's been asked him to take a show and also been disruptive. He is on the Dilaudid PCA  And has used 28.2 mg was 54 demands and 47 deliveries in the last 24 hours. He has no fever overnight and no significant change in shortness of breath. Patient has not gotten out of bed to walk so far. Currently on cefepime and Zyvox  for possible HCAP.  Objective: Vital signs in last 24 hours: Temp:  [98.4 F (36.9 C)-99.5 F (37.5 C)] 98.4 F (36.9 C) (02/20 1321) Pulse Rate:  [80-93] 93 (02/20 1321) Resp:  [10-16] 16 (02/20 1508) BP: (121-132)/(64-71) 129/71 mmHg (02/20 1321) SpO2:  [96 %-100 %] 96 % (02/20 1508) Weight change:  Last BM Date:  (PTA)  Intake/Output from previous day: 02/19 0701 - 02/20 0700 In: 4196 [P.O.:720; I.V.:2400; IV Piggyback:450] Out: 2725 [Urine:2725] Intake/Output this shift: Total I/O In: 1490 [P.O.:240; I.V.:900; IV Piggyback:350] Out: 1000 [Urine:1000]  General appearance: alert, cooperative, cachectic and no distress Head: Normocephalic, without obvious abnormality, atraumatic Eyes: conjunctivae/corneas clear. PERRL, EOM's intact. Fundi benign. Neck: no adenopathy, no carotid bruit, no JVD, supple, symmetrical, trachea midline and thyroid not enlarged, symmetric, no tenderness/mass/nodules Back: symmetric, no curvature. ROM normal. No CVA tenderness. Resp: clear to auscultation bilaterally Chest wall: no tenderness Cardio: regular rate and rhythm, S1, S2 normal, no murmur, click, rub or gallop GI: soft, non-tender; bowel sounds normal; no masses,  no organomegaly Extremities: extremities normal, atraumatic, no cyanosis or edema Pulses: 2+ and symmetric Skin: Skin color, texture, turgor normal. No rashes or lesions Neurologic: Grossly normal  Lab Results:  Recent Labs  04/04/14 0540 04/05/14 0605  WBC 10.6* 10.3  HGB 6.2* 6.1*  HCT  18.8* 18.4*  PLT 368 436*   BMET  Recent Labs  04/04/14 0540 04/05/14 0605  NA 138 135  K 3.6 3.6  CL 98 96  CO2 31 32  GLUCOSE 107* 106*  BUN <5* 6  CREATININE 0.48* 0.45*  CALCIUM 8.7 8.7    Studies/Results: No results found.  Medications: I have reviewed the patient's current medications.  Assessment/Plan: A 24 year old cachectic male with sickle cell painful crisis. Next  #1 sickle cell painful crisis: Patient is using less  Of the PCA than previous days but still complaining of pain. I will start him on oral pain medication today in preparation for discharge within next 24 to 48 hours.  #2 hypoxemia: It seems to have resolved. Continue oxygenation and current antibiotics.  #3 bilateral pneumonia: continue Linozelid and cefepime for HCAP.  #4 Malnutrition:  Continue to encourage nutrition.  #5 sickle cell anemia: His hemoglobin is stable. Continue to monitor.  #6 hypokalemia: Potassium has been repleted. We'll continue to monitor.   #7 psychiatric illness: patient is having issues with behavior today. He has been informed that he is aware of what is doing and if he continues we will  Have nausea but to discharge him  from the hospital.   #8 vitamin D deficiency: I started the patient on vitamin D in the hospital. Further treatment as an outpatient.  #9 leukocytosis: Most likely secondary to sickle cell disease. We will continue to monitor.  #10 constipation: Patient is currently on senna S. We will continue.  LOS: 6 days   Crescentia Boutwell,LAWAL 04/05/2014, 4:54 PM

## 2014-04-06 LAB — CULTURE, BLOOD (ROUTINE X 2)
Culture: NO GROWTH
Culture: NO GROWTH

## 2014-04-06 LAB — CBC
HCT: 20.7 % — ABNORMAL LOW (ref 39.0–52.0)
Hemoglobin: 6.6 g/dL — CL (ref 13.0–17.0)
MCH: 26.1 pg (ref 26.0–34.0)
MCHC: 31.9 g/dL (ref 30.0–36.0)
MCV: 81.8 fL (ref 78.0–100.0)
Platelets: 542 10*3/uL — ABNORMAL HIGH (ref 150–400)
RBC: 2.53 MIL/uL — ABNORMAL LOW (ref 4.22–5.81)
RDW: 24 % — ABNORMAL HIGH (ref 11.5–15.5)
WBC: 12.3 10*3/uL — ABNORMAL HIGH (ref 4.0–10.5)

## 2014-04-06 NOTE — Progress Notes (Signed)
Subjective: Patient still complaining of pain all over.  He has not been disruptive today. He has been taking oral oxycodone every 4 hours and seemed to be doing much better. His demand for for pain medication has decreased today. He is on the Dilaudid PCA  And has used 23.2 mg was 48 demands and 47 deliveries in the last 24 hours. He has no fever overnight and no significant change in shortness of breath. Patient has not gotten out of bed to walk so far. Currently on cefepime and Zyvox  for possible HCAP.  Objective: Vital signs in last 24 hours: Temp:  [98.4 F (36.9 C)-98.7 F (37.1 C)] 98.5 F (36.9 C) (02/21 0448) Pulse Rate:  [74-93] 82 (02/21 0448) Resp:  [10-16] 12 (02/21 1129) BP: (102-129)/(50-74) 125/66 mmHg (02/21 0448) SpO2:  [96 %-100 %] 96 % (02/21 1129) Weight change:  Last BM Date:  (PTA)  Intake/Output from previous day: 02/20 0701 - 02/21 0700 In: 3390 [P.O.:240; I.V.:2400; IV Piggyback:750] Out: 3000 [Urine:3000] Intake/Output this shift: Total I/O In: 310 [I.V.:310] Out: 500 [Urine:500]  General appearance: alert, cooperative, cachectic and no distress Head: Normocephalic, without obvious abnormality, atraumatic Eyes: conjunctivae/corneas clear. PERRL, EOM's intact. Fundi benign. Neck: no adenopathy, no carotid bruit, no JVD, supple, symmetrical, trachea midline and thyroid not enlarged, symmetric, no tenderness/mass/nodules Back: symmetric, no curvature. ROM normal. No CVA tenderness. Resp: clear to auscultation bilaterally Chest wall: no tenderness Cardio: regular rate and rhythm, S1, S2 normal, no murmur, click, rub or gallop GI: soft, non-tender; bowel sounds normal; no masses,  no organomegaly Extremities: extremities normal, atraumatic, no cyanosis or edema Pulses: 2+ and symmetric Skin: Skin color, texture, turgor normal. No rashes or lesions Neurologic: Grossly normal  Lab Results:  Recent Labs  04/05/14 0605 04/06/14 0602  WBC 10.3 12.3*   HGB 6.1* 6.6*  HCT 18.4* 20.7*  PLT 436* 542*   BMET  Recent Labs  04/04/14 0540 04/05/14 0605  NA 138 135  K 3.6 3.6  CL 98 96  CO2 31 32  GLUCOSE 107* 106*  BUN <5* 6  CREATININE 0.48* 0.45*  CALCIUM 8.7 8.7    Studies/Results: No results found.  Medications: I have reviewed the patient's current medications.  Assessment/Plan: A 24 year old cachectic male with sickle cell painful crisis. Next  #1 sickle cell painful crisis: Patient is using less  Of the PCA than previous days but still complaining of pain. We will continue with current regimen while preparing for possible discharge next 24 hours  #2 hypoxemia: resolved. Continue oxygenation and current antibiotics.  #3 bilateral pneumonia: continue Linozelid and cefepime for HCAP.  #4 Malnutrition:  Continue to encourage nutrition.  #5 sickle cell anemia: His hemoglobin is stable. Continue to monitor.  #6 hypokalemia: Potassium has been repleted. We'll continue to monitor.   #7 psychiatric illness: No behavioral changes today. Continue to monitor  #8 vitamin D deficiency: I started the patient on vitamin D in the hospital. Further treatment as an outpatient.  #9 leukocytosis: Most likely secondary to sickle cell disease. We will continue to monitor.  #10 constipation: Patient is currently on senna S. he is having bowels movements . We will continue.  LOS: 7 days   GARBA,LAWAL 04/06/2014, 12:08 PM

## 2014-04-07 ENCOUNTER — Inpatient Hospital Stay (HOSPITAL_COMMUNITY): Payer: Medicaid Other

## 2014-04-07 LAB — CBC WITH DIFFERENTIAL/PLATELET
Basophils Absolute: 0 10*3/uL (ref 0.0–0.1)
Basophils Relative: 0 % (ref 0–1)
Eosinophils Absolute: 0.2 10*3/uL (ref 0.0–0.7)
Eosinophils Relative: 2 % (ref 0–5)
HCT: 20.6 % — ABNORMAL LOW (ref 39.0–52.0)
Hemoglobin: 6.5 g/dL — CL (ref 13.0–17.0)
Lymphocytes Relative: 15 % (ref 12–46)
Lymphs Abs: 1.7 10*3/uL (ref 0.7–4.0)
MCH: 25.9 pg — ABNORMAL LOW (ref 26.0–34.0)
MCHC: 31.6 g/dL (ref 30.0–36.0)
MCV: 82.1 fL (ref 78.0–100.0)
Monocytes Absolute: 1 10*3/uL (ref 0.1–1.0)
Monocytes Relative: 9 % (ref 3–12)
Neutro Abs: 8.3 10*3/uL — ABNORMAL HIGH (ref 1.7–7.7)
Neutrophils Relative %: 74 % (ref 43–77)
Platelets: 782 10*3/uL — ABNORMAL HIGH (ref 150–400)
RBC: 2.51 MIL/uL — ABNORMAL LOW (ref 4.22–5.81)
RDW: 24.6 % — ABNORMAL HIGH (ref 11.5–15.5)
WBC: 11.2 10*3/uL — ABNORMAL HIGH (ref 4.0–10.5)
nRBC: 16 /100 WBC — ABNORMAL HIGH

## 2014-04-07 LAB — COMPREHENSIVE METABOLIC PANEL
ALT: 18 U/L (ref 0–53)
AST: 25 U/L (ref 0–37)
Albumin: 3.4 g/dL — ABNORMAL LOW (ref 3.5–5.2)
Alkaline Phosphatase: 87 U/L (ref 39–117)
Anion gap: 6 (ref 5–15)
BUN: 7 mg/dL (ref 6–23)
CO2: 33 mmol/L — ABNORMAL HIGH (ref 19–32)
Calcium: 9.5 mg/dL (ref 8.4–10.5)
Chloride: 96 mmol/L (ref 96–112)
Creatinine, Ser: 0.45 mg/dL — ABNORMAL LOW (ref 0.50–1.35)
GFR calc Af Amer: >90 mL/min (ref >90)
GFR calc non Af Amer: >90 mL/min (ref >90)
Glucose, Bld: 102 mg/dL — ABNORMAL HIGH (ref 70–99)
Potassium: 4.6 mmol/L (ref 3.5–5.1)
Sodium: 135 mmol/L (ref 135–145)
Total Bilirubin: 0.9 mg/dL (ref 0.3–1.2)
Total Protein: 8.4 g/dL — ABNORMAL HIGH (ref 6.0–8.3)

## 2014-04-07 LAB — TYPE AND SCREEN
ABO/RH(D): A POS
Antibody Screen: NEGATIVE
Donor AG Type: NEGATIVE
Unit division: 0

## 2014-04-07 MED ORDER — OXYCODONE-ACETAMINOPHEN 5-325 MG PO TABS: 1.0000 | ORAL_TABLET | ORAL | Status: DC | PRN

## 2014-04-07 NOTE — Progress Notes (Signed)
Subjective: Patient now complaining of left shoulder pain and unable to lift it above his head. This is a new pain that came in this morning. It has limited his range of motion and the pain is 7 out of 10. Currently on cefepime and Zyvox  for possible HCAP. He has used 32.5 mg of Dilaudid with 56 demands and 54 deliveries in the last 24 hours. He's also been taking oral Dilaudid 4 mg every 4 hours as needed. He will like to take Percocet instead which he will use at home. No fever today and no significant shortness of breath.  Objective: Vital signs in last 24 hours: Temp:  [98.3 F (36.8 C)-99.3 F (37.4 C)] 98.7 F (37.1 C) (02/22 1839) Pulse Rate:  [74-88] 81 (02/22 1839) Resp:  [8-12] 11 (02/22 1839) BP: (94-113)/(51-59) 106/54 mmHg (02/22 1839) SpO2:  [92 %-100 %] 100 % (02/22 1839) Weight change:  Last BM Date:  (PTA)  Intake/Output from previous day: 02/21 0701 - 02/22 0700 In: 2390 [P.O.:840; I.V.:1100; IV Piggyback:450] Out: 7846 [Urine:3275] Intake/Output this shift:    General appearance: alert, cooperative, cachectic and no distress Head: Normocephalic, without obvious abnormality, atraumatic Eyes: conjunctivae/corneas clear. PERRL, EOM's intact. Fundi benign. Neck: no adenopathy, no carotid bruit, no JVD, supple, symmetrical, trachea midline and thyroid not enlarged, symmetric, no tenderness/mass/nodules Back: symmetric, no curvature. ROM normal. No CVA tenderness. Resp: clear to auscultation bilaterally Chest wall: no tenderness Cardio: regular rate and rhythm, S1, S2 normal, no murmur, click, rub or gallop GI: soft, non-tender; bowel sounds normal; no masses,  no organomegaly Extremities: extremities normal, atraumatic, no cyanosis or edema Pulses: 2+ and symmetric Skin: Skin color, texture, turgor normal. No rashes or lesions Neurologic: Grossly normal  Lab Results:  Recent Labs  04/06/14 0602 04/07/14 0550  WBC 12.3* 11.2*  HGB 6.6* 6.5*  HCT 20.7* 20.6*   PLT 542* 782*   BMET  Recent Labs  04/05/14 0605 04/07/14 0550  NA 135 135  K 3.6 4.6  CL 96 96  CO2 32 33*  GLUCOSE 106* 102*  BUN 6 7  CREATININE 0.45* 0.45*  CALCIUM 8.7 9.5    Studies/Results: Dg Shoulder Left  04/07/2014   CLINICAL DATA:  Sickle cell crisis; pt c/o left shoulder pain since yesterday, no injury; pain is posterior; imaging performed supine due to fall risk--pt was unable to oblique for grashey view due to extreme pain  EXAM: LEFT SHOULDER - 2+ VIEW  COMPARISON:  None.  FINDINGS: There is no evidence of fracture or dislocation. There is no evidence of arthropathy or other focal bone abnormality. Soft tissues are unremarkable.  IMPRESSION: Negative.   Electronically Signed   By: Lajean Manes M.D.   On: 04/07/2014 17:05    Medications: I have reviewed the patient's current medications.  Assessment/Plan: A 24 year old cachectic male with sickle cell painful crisis. Next  #1 sickle cell painful crisis: Patient is having new left shoulder pain and decrease in the range of motion. This could reflect infarct in the area oral aseptic necrosis. We will continue with current regimen and consider 2 view x-ray of the shoulder to rule out injury. I will change his oral Dilaudid to Percocet which he apparently tolerated in the past  #2 hypoxemia: resolved. Continue oxygenation and current antibiotics.  #3 bilateral pneumonia: continue Linozelid and cefepime for HCAP. We'll transition him to oral Augmentin at discharge  #4 Malnutrition:  Continue to encourage nutrition with supplements.  #5 sickle cell anemia: His hemoglobin is  stable. Continue to monitor.  #6 hypokalemia: Potassium has been repleted. We'll continue to monitor.   #7 psychiatric illness: No behavioral changes today. Continue to monitor  #8 vitamin D deficiency: I started the patient on vitamin D in the hospital. Further treatment as an outpatient.  #9 leukocytosis: Most likely secondary to sickle  cell disease. We will continue to monitor.  #10 constipation: Patient is currently on senna S and he is having bowels movements .   LOS: 8 days   Klani Caridi,LAWAL 04/07/2014, 10:59 AM

## 2014-04-07 NOTE — Progress Notes (Signed)
ANTIBIOTIC CONSULT NOTE - follow up  Pharmacy Consult for cefepime Indication: HCAP  Allergies  Allergen Reactions  . Morphine And Related Hives  . Toradol [Ketorolac Tromethamine] Itching  . Tramadol Hives  . Vancomycin Itching  . Zosyn [Piperacillin Sod-Tazobactam So] Itching and Rash    Has taken rocephin in past    Patient Measurements: Height: 6\' 2"  (188 cm) Weight: 135 lb (61.236 kg) IBW/kg (Calculated) : 82.2   Vital Signs: Temp: 98.3 F (36.8 C) (02/22 0424) Temp Source: Oral (02/22 0424) BP: 94/51 mmHg (02/22 0424) Pulse Rate: 76 (02/22 0424) Intake/Output from previous day: 02/21 0701 - 02/22 0700 In: 2390 [P.O.:840; I.V.:1100; IV Piggyback:450] Out: 1517 [Urine:3275] Intake/Output from this shift: Total I/O In: 2200 [I.V.:1900; IV Piggyback:300] Out: 850 [Urine:850]  Labs:  Recent Labs  04/05/14 0605 04/06/14 0602 04/07/14 0550  WBC 10.3 12.3* 11.2*  HGB 6.1* 6.6* 6.5*  PLT 436* 542* 782*  CREATININE 0.45*  --  0.45*   Estimated Creatinine Clearance: 122.2 mL/min (by C-G formula based on Cr of 0.45). No results for input(s): VANCOTROUGH, VANCOPEAK, VANCORANDOM, GENTTROUGH, GENTPEAK, GENTRANDOM, TOBRATROUGH, TOBRAPEAK, TOBRARND, AMIKACINPEAK, AMIKACINTROU, AMIKACIN in the last 72 hours.   Microbiology: Recent Results (from the past 720 hour(s))  Blood culture (routine x 2)     Status: None   Collection Time: 03/31/14  5:10 AM  Result Value Ref Range Status   Specimen Description BLOOD RIGHT ARM  Final   Special Requests BOTTLES DRAWN AEROBIC AND ANAEROBIC 4CC  Final   Culture   Final    NO GROWTH 5 DAYS Performed at [facility]    Report Status 04/06/2014 FINAL  Final  Culture, blood (routine x 2)     Status: None   Collection Time: 03/31/14 11:47 AM  Result Value Ref Range Status   Specimen Description BLOOD LEFT HAND  Final   Special Requests BOTTLES DRAWN AEROBIC ONLY 5CC  Final   Culture   Final    NO GROWTH 5 DAYS Performed  at [facility]    Report Status 04/06/2014 FINAL  Final  Culture, expectorated sputum-assessment     Status: None   Collection Time: 04/04/14 10:52 AM  Result Value Ref Range Status   Specimen Description SPUTUM  Final   Special Requests NONE  Final   Sputum evaluation   Final    MICROSCOPIC FINDINGS SUGGEST THAT THIS SPECIMEN IS NOT REPRESENTATIVE OF LOWER RESPIRATORY SECRETIONS. PLEASE RECOLLECT. CALLED TO K. CAPES. RN AT [phone] ON 2.19.16 BY [email or web address].    Report Status 04/04/2014 FINAL  Final    Medical History: Past Medical History  Diagnosis Date  . Sickle cell anemia   . 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 Vitamin D deficiency y-6    Medications:  Anti-infectives    Start     Dose/Rate Route Frequency Ordered Stop   04/04/14 1400  ceFEPIme (MAXIPIME) 1 g in dextrose 5 % 50 mL IVPB     1 g 100 mL/hr over 30 Minutes Intravenous 3 times per day 04/04/14 0753     04/04/14 0630  linezolid (ZYVOX) IVPB 600 mg     600 mg 300 mL/hr over 60 Minutes Intravenous 2 times daily 04/04/14 0611     04/04/14 0630  ceFEPIme (MAXIPIME) 2 g in dextrose 5 % 50 mL IVPB  Status:  Discontinued     2 g 100 mL/hr over 30 Minutes Intravenous 3 times per  day 04/04/14 0615 04/04/14 0753   04/01/14 1600  azithromycin (ZITHROMAX) 500 mg in dextrose 5 % 250 mL IVPB  Status:  Discontinued     500 mg 250 mL/hr over 60 Minutes Intravenous Every 24 hours 04/01/14 1356 04/04/14 0601   03/31/14 1300  cefTRIAXone (ROCEPHIN) 1 g in dextrose 5 % 50 mL IVPB - Premix  Status:  Discontinued     1 g 100 mL/hr over 30 Minutes Intravenous Daily 03/31/14 1214 04/04/14 0601     Assessment: A 24 year old gentleman who is new to our system here with sickle cell painful crisis. Patient has also had psychiatric issues with multiple other problems including bilateral pneumonia--spiked temp, now zyvox and cefepime per pharmacy.  Goal of Therapy:   Cefepime dosed based on patient weight and renal function  Plan:  Continue Cefepime 1gm IV q8h Monitor for serotonin syndrome with zyvox and celexa   Dolly Rias RPh 04/07/2014, 1:35 PM Pager [phone]

## 2014-04-07 NOTE — Progress Notes (Signed)
Clinical Social Work  CSW went to room to round on patient. Patient sleeping and did not awaken when CSW entered room. Bedside RN reports patient has been sleeping all morning with no behaviors. CSW will continue to follow.  [place], [place] [phone]

## 2014-04-08 DIAGNOSIS — K5900 Constipation, unspecified: Secondary | ICD-10-CM

## 2014-04-08 LAB — COMPREHENSIVE METABOLIC PANEL
ALT: 18 U/L (ref 0–53)
AST: 55 U/L — ABNORMAL HIGH (ref 0–37)
Albumin: 3.4 g/dL — ABNORMAL LOW (ref 3.5–5.2)
Alkaline Phosphatase: 84 U/L (ref 39–117)
Anion gap: 8 (ref 5–15)
BUN: 12 mg/dL (ref 6–23)
CO2: 33 mmol/L — ABNORMAL HIGH (ref 19–32)
Calcium: 9.5 mg/dL (ref 8.4–10.5)
Chloride: 93 mmol/L — ABNORMAL LOW (ref 96–112)
Creatinine, Ser: 0.51 mg/dL (ref 0.50–1.35)
GFR calc Af Amer: >90 mL/min (ref >90)
GFR calc non Af Amer: >90 mL/min (ref >90)
Glucose, Bld: 117 mg/dL — ABNORMAL HIGH (ref 70–99)
Potassium: 4.5 mmol/L (ref 3.5–5.1)
Sodium: 134 mmol/L — ABNORMAL LOW (ref 135–145)
Total Bilirubin: 1.1 mg/dL (ref 0.3–1.2)
Total Protein: 9 g/dL — ABNORMAL HIGH (ref 6.0–8.3)

## 2014-04-08 LAB — CBC WITH DIFFERENTIAL/PLATELET
Basophils Absolute: 0 10*3/uL (ref 0.0–0.1)
Basophils Relative: 0 % (ref 0–1)
Eosinophils Absolute: 0.3 10*3/uL (ref 0.0–0.7)
Eosinophils Relative: 2 % (ref 0–5)
HCT: 22 % — ABNORMAL LOW (ref 39.0–52.0)
Hemoglobin: 6.9 g/dL — CL (ref 13.0–17.0)
Lymphocytes Relative: 18 % (ref 12–46)
Lymphs Abs: 2.3 10*3/uL (ref 0.7–4.0)
MCH: 25.9 pg — ABNORMAL LOW (ref 26.0–34.0)
MCHC: 31.4 g/dL (ref 30.0–36.0)
MCV: 82.7 fL (ref 78.0–100.0)
Monocytes Absolute: 1.9 10*3/uL — ABNORMAL HIGH (ref 0.1–1.0)
Monocytes Relative: 15 % — ABNORMAL HIGH (ref 3–12)
Neutro Abs: 8.5 10*3/uL — ABNORMAL HIGH (ref 1.7–7.7)
Neutrophils Relative %: 66 % (ref 43–77)
Platelets: 883 10*3/uL — ABNORMAL HIGH (ref 150–400)
RBC: 2.66 MIL/uL — ABNORMAL LOW (ref 4.22–5.81)
RDW: 25.4 % — ABNORMAL HIGH (ref 11.5–15.5)
WBC: 13 10*3/uL — ABNORMAL HIGH (ref 4.0–10.5)
nRBC: 15 /100 WBC — ABNORMAL HIGH

## 2014-04-08 LAB — PREPARE RBC (CROSSMATCH)

## 2014-04-08 MED ORDER — SODIUM CHLORIDE 0.9 % IV SOLN: Freq: Once | INTRAVENOUS | Status: AC

## 2014-04-08 MED ORDER — CEFUROXIME AXETIL 500 MG PO TABS
500.0000 mg | ORAL_TABLET | Freq: Two times a day (BID) | ORAL | Status: DC
  Administered 2014-04-08 – 2014-04-09 (×2): 500 mg via ORAL
  Filled 2014-04-08 (×3): qty 1

## 2014-04-08 MED ORDER — HYDROCORTISONE 2.5 % RE CREA
TOPICAL_CREAM | Freq: Three times a day (TID) | RECTAL | Status: DC
  Administered 2014-04-08 – 2014-04-09 (×2): via RECTAL
  Filled 2014-04-08: qty 28.35

## 2014-04-08 MED ORDER — OXYCODONE HCL 5 MG PO TABS
5.0000 mg | ORAL_TABLET | ORAL | Status: DC
  Administered 2014-04-08 – 2014-04-09 (×7): 5 mg via ORAL
  Filled 2014-04-08 (×7): qty 1

## 2014-04-08 MED ORDER — HYDROMORPHONE HCL 4 MG PO TABS
4.0000 mg | ORAL_TABLET | ORAL | Status: DC
  Administered 2014-04-08: 4 mg via ORAL
  Filled 2014-04-08: qty 1

## 2014-04-08 MED ORDER — HYDROMORPHONE HCL 2 MG/ML IJ SOLN
2.0000 mg | INTRAMUSCULAR | Status: DC | PRN
Start: 2014-04-08 — End: 2014-04-09

## 2014-04-08 MED ORDER — POLYETHYLENE GLYCOL 3350 17 G PO PACK
17.0000 g | PACK | ORAL | Status: AC
  Administered 2014-04-08: 17 g via ORAL

## 2014-04-08 MED ORDER — OXYCODONE-ACETAMINOPHEN 5-325 MG PO TABS
1.0000 | ORAL_TABLET | ORAL | Status: DC
Start: 2014-04-08 — End: 2014-04-09
  Administered 2014-04-08 – 2014-04-09 (×7): 1 via ORAL
  Filled 2014-04-08 (×7): qty 1

## 2014-04-08 MED ORDER — HYDROMORPHONE HCL 2 MG/ML IJ SOLN
2.0000 mg | Freq: Once | INTRAMUSCULAR | Status: AC
  Administered 2014-04-08: 2 mg via INTRAVENOUS

## 2014-04-08 NOTE — Progress Notes (Signed)
Report received from [place], Therapist, sports. Pt resting in bed, on his cell phone. Minimally interactive with Probation officer, answering questions with nods, grunts. When asked if pain is under control patient shrugs and looks down at game on his phone. Will monitor.

## 2014-04-08 NOTE — Progress Notes (Signed)
Nurse reports that patient states that oral dilaudid is not effective for his pain and is requesting a change of medication to Percocet 10 mg. Will change to Percocet 10-325 mg scheduled every 4 hours.

## 2014-04-08 NOTE — Consult Note (Signed)
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 male with a past medical history of sickle cell disease, anemia, hyperbilirubinemia, who was admitted on 2/14 for sharp chest and low back pain 9 out of 10 in intensity and feels like his usual sickle cell pain. He denies any fever, chills, no diarrhea, no nausea, vomiting. However, he was found to have high fevers, leukocytosis. Due to concern that infection triggering his sickle cell pain crisis, infectious work up was pursued. He had cxr that had peribronchial thickening that may have been concerning for pneumonia. He did have chest CT that showed small effusions, with associated atelectisis of right lung. He was initially started on ceftriaxone but then swithced to linezolid plus cefepime due to still having fevers on day 4. He now does not have any oxygen requirement. He denies fever, cough or shortness of breath.  Past Medical History  Diagnosis Date  . Sickle cell anemia   . 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 Vitamin D deficiency y-6    Allergies:  Allergies  Allergen Reactions  . Morphine And Related Hives  . Toradol [Ketorolac Tromethamine] Itching  . Tramadol Hives  . Vancomycin Itching  . Zosyn [Piperacillin Sod-Tazobactam So] Itching and Rash    Has taken rocephin in past    MEDICATIONS: . antiseptic oral rinse  7 mL Mouth Rinse BID  . ceFEPime (MAXIPIME) IV  1 g Intravenous 3 times per day  .  cholecalciferol  2,000 Units Oral Daily  . diclofenac sodium  2 g Topical QID  . enoxaparin (LOVENOX) injection  40 mg Subcutaneous Q24H  . escitalopram  10 mg Oral Daily  . feeding supplement (ENSURE COMPLETE)  237 mL Oral TID BM  . folic acid  1 mg Oral Daily  . hydrOXYzine  50 mg Oral QHS  . lactose free nutrition  237 mL Oral TID WC  . oxyCODONE-acetaminophen  1 tablet Oral Q4H   And  . oxyCODONE  5 mg Oral Q4H  . pneumococcal 23 valent vaccine  0.5 mL Intramuscular Tomorrow-1000  . senna-docusate  1 tablet Oral BID    History  Substance Use Topics  . Smoking status: Never Smoker   . Smokeless tobacco: Never Used  . Alcohol Use: No    History reviewed. No pertinent family history.  Review of Systems  Constitutional: Negative for fever, chills, diaphoresis, activity change, appetite change, fatigue and unexpected weight change.  HENT: Negative for congestion, sore throat, rhinorrhea, sneezing, trouble swallowing and sinus pressure.  Eyes: Negative for photophobia and visual disturbance.  Respiratory: Negative for cough, chest tightness, shortness of breath, wheezing and stridor.  Cardiovascular: Negative for chest pain, palpitations and leg swelling.  Gastrointestinal: Negative for nausea, vomiting, abdominal pain, diarrhea, constipation, blood in stool, abdominal distention and anal bleeding.  Genitourinary: Negative for dysuria, hematuria, flank  pain and difficulty urinating.  Musculoskeletal: + back pain occ chest pain Skin: Negative for color change, pallor, rash and wound.  Neurological: Negative for dizziness, tremors, weakness and light-headedness.  Hematological: Negative for adenopathy. Does not bruise/bleed easily.  Psychiatric/Behavioral: Negative for behavioral problems, confusion, sleep disturbance, dysphoric mood, decreased concentration and agitation.     OBJECTIVE: Temp:  [98.7 F (37.1 C)-99.5 F (37.5 C)] 98.9 F (37.2 C) (02/23 1438) Pulse Rate:   [81-88] 82 (02/23 1438) Resp:  [9-14] 14 (02/23 1438) BP: (98-110)/(50-57) 110/50 mmHg (02/23 1438) SpO2:  [100 %] 100 % (02/23 1438)  Constitutional: He is oriented to person, place, and time. He appears thin. No distress.  HENT:  Mouth/Throat: Oropharynx is clear and moist. No oropharyngeal exudate.  Cardiovascular: Normal rate, regular rhythm and normal heart sounds. Exam reveals no gallop and no friction rub.  No murmur heard.  Pulmonary/Chest: Effort normal and breath sounds normal. No respiratory distress. He has no wheezes.  Abdominal: Soft. Bowel sounds are normal. He exhibits no distension. There is no tenderness.  Lymphadenopathy:  He has no cervical adenopathy.  Neurological: He is alert and oriented to person, place, and time.  Skin: Skin is warm and dry. No rash noted.tattoos to chest Psychiatric: He has a normal mood and affect. His behavior is normal.     LABS: Results for orders placed or performed during the hospital encounter of 03/30/14 (from the past 48 hour(s))  CBC with Differential/Platelet     Status: Abnormal   Collection Time: 04/07/14  5:50 AM  Result Value Ref Range   WBC 11.2 (H) 4.0 - 10.5 K/uL    Comment: ADJUSTED FOR NUCLEATED RBC'S   RBC 2.51 (L) 4.22 - 5.81 MIL/uL   Hemoglobin 6.5 (LL) 13.0 - 17.0 g/dL    Comment: CRITICAL VALUE NOTED.  VALUE IS CONSISTENT WITH PREVIOUSLY REPORTED AND CALLED VALUE.   HCT 20.6 (L) 39.0 - 52.0 %   MCV 82.1 78.0 - 100.0 fL   MCH 25.9 (L) 26.0 - 34.0 pg   MCHC 31.6 30.0 - 36.0 g/dL   RDW 24.6 (H) 11.5 - 15.5 %   Platelets 782 (H) 150 - 400 K/uL    Comment: DELTA CHECK NOTED   Neutrophils Relative % 74 43 - 77 %   Lymphocytes Relative 15 12 - 46 %   Monocytes Relative 9 3 - 12 %   Eosinophils Relative 2 0 - 5 %   Basophils Relative 0 0 - 1 %   nRBC 16 (H) 0 /100 WBC   Neutro Abs 8.3 (H) 1.7 - 7.7 K/uL   Lymphs Abs 1.7 0.7 - 4.0 K/uL   Monocytes Absolute 1.0 0.1 - 1.0 K/uL   Eosinophils Absolute 0.2 0.0 - 0.7  K/uL   Basophils Absolute 0.0 0.0 - 0.1 K/uL   RBC Morphology POLYCHROMASIA PRESENT     Comment: TARGET CELLS SICKLE CELLS    Smear Review LARGE PLATELETS PRESENT   Comprehensive metabolic panel     Status: Abnormal   Collection Time: 04/07/14  5:50 AM  Result Value Ref Range   Sodium 135 135 - 145 mmol/L   Potassium 4.6 3.5 - 5.1 mmol/L    Comment: DELTA CHECK NOTED REPEATED TO VERIFY NO VISIBLE HEMOLYSIS    Chloride 96 96 - 112 mmol/L   CO2 33 (H) 19 - 32 mmol/L   Glucose, Bld 102 (H) 70 - 99 mg/dL   BUN 7 6 - 23 mg/dL   Creatinine, Ser 0.45 (L)  0.50 - 1.35 mg/dL   Calcium 9.5 8.4 - 10.5 mg/dL   Total Protein 8.4 (H) 6.0 - 8.3 g/dL   Albumin 3.4 (L) 3.5 - 5.2 g/dL   AST 25 0 - 37 U/L   ALT 18 0 - 53 U/L   Alkaline Phosphatase 87 39 - 117 U/L   Total Bilirubin 0.9 0.3 - 1.2 mg/dL   GFR calc non Af Amer >90 >90 mL/min   GFR calc Af Amer >90 >90 mL/min    Comment: (NOTE) The eGFR has been calculated using the CKD EPI equation. This calculation has not been validated in all clinical situations. eGFR's persistently <90 mL/min signify possible Chronic Kidney Disease.    Anion gap 6 5 - 15  CBC with Differential/Platelet     Status: Abnormal   Collection Time: 04/08/14  4:17 AM  Result Value Ref Range   WBC 13.0 (H) 4.0 - 10.5 K/uL    Comment: ADJUSTED FOR NUCLEATED RBC'S   RBC 2.66 (L) 4.22 - 5.81 MIL/uL   Hemoglobin 6.9 (LL) 13.0 - 17.0 g/dL    Comment: REPEATED TO VERIFY CRITICAL VALUE NOTED.  VALUE IS CONSISTENT WITH PREVIOUSLY REPORTED AND CALLED VALUE.    HCT 22.0 (L) 39.0 - 52.0 %   MCV 82.7 78.0 - 100.0 fL   MCH 25.9 (L) 26.0 - 34.0 pg   MCHC 31.4 30.0 - 36.0 g/dL   RDW 25.4 (H) 11.5 - 15.5 %   Platelets 883 (H) 150 - 400 K/uL   Neutrophils Relative % 66 43 - 77 %   Neutro Abs 8.5 (H) 1.7 - 7.7 K/uL   Lymphocytes Relative 18 12 - 46 %   Lymphs Abs 2.3 0.7 - 4.0 K/uL   Monocytes Relative 15 (H) 3 - 12 %   Monocytes Absolute 1.9 (H) 0.1 - 1.0 K/uL    Eosinophils Relative 2 0 - 5 %   Eosinophils Absolute 0.3 0.0 - 0.7 K/uL   Basophils Relative 0 0 - 1 %   Basophils Absolute 0.0 0.0 - 0.1 K/uL   RBC Morphology TARGET CELLS     Comment: SICKLE CELLS MARKED POLYCHROMASIA HOWELL/JOLLY BODIES    nRBC 15 (H) 0 /100 WBC  Comprehensive metabolic panel     Status: Abnormal   Collection Time: 04/08/14  4:17 AM  Result Value Ref Range   Sodium 134 (L) 135 - 145 mmol/L   Potassium 4.5 3.5 - 5.1 mmol/L   Chloride 93 (L) 96 - 112 mmol/L   CO2 33 (H) 19 - 32 mmol/L   Glucose, Bld 117 (H) 70 - 99 mg/dL   BUN 12 6 - 23 mg/dL   Creatinine, Ser 0.51 0.50 - 1.35 mg/dL   Calcium 9.5 8.4 - 10.5 mg/dL   Total Protein 9.0 (H) 6.0 - 8.3 g/dL   Albumin 3.4 (L) 3.5 - 5.2 g/dL   AST 55 (H) 0 - 37 U/L   ALT 18 0 - 53 U/L   Alkaline Phosphatase 84 39 - 117 U/L   Total Bilirubin 1.1 0.3 - 1.2 mg/dL   GFR calc non Af Amer >90 >90 mL/min   GFR calc Af Amer >90 >90 mL/min    Comment: (NOTE) The eGFR has been calculated using the CKD EPI equation. This calculation has not been validated in all clinical situations. eGFR's persistently <90 mL/min signify possible Chronic Kidney Disease.    Anion gap 8 5 - 15  Type and screen for Sickle Cell Protocol  Status: None (Preliminary result)   Collection Time: 04/08/14  9:50 AM  Result Value Ref Range   ABO/RH(D) A POS    Antibody Screen NEG    Sample Expiration 04/11/2014    Unit Number [id number]    Blood Component Type RED CELLS,LR    Unit division 00    Status of Unit ALLOCATED    Donor AG Type NEGATIVE FOR C ANTIGEN NEGATIVE FOR KELL ANTIGEN    Transfusion Status OK TO TRANSFUSE    Crossmatch Result COMPATIBLE   Prepare RBC     Status: None   Collection Time: 04/08/14 10:00 AM  Result Value Ref Range   Order Confirmation ORDER PROCESSED BY BLOOD BANK     MICRO: 2/15 blood cx ngtd IMAGING: Dg Shoulder Left  04/07/2014   CLINICAL DATA:  Sickle cell crisis; pt c/o left shoulder pain since  yesterday, no injury; pain is posterior; imaging performed supine due to fall risk--pt was unable to oblique for grashey view due to extreme pain  EXAM: LEFT SHOULDER - 2+ VIEW  COMPARISON:  None.  FINDINGS: There is no evidence of fracture or dislocation. There is no evidence of arthropathy or other focal bone abnormality. Soft tissues are unremarkable.  IMPRESSION: Negative.   Electronically Signed   By: Lajean Manes M.D.   On: 04/07/2014 17:05    Assessment/Plan:  24yo M with sickle cell disease presents with crisis now on Day #9 of antibiotics for pneumonia  - recommend to finish up antibiotics course tomorrow being the last day, can finish with cefuroxime 523m BID. Will discontinue linezolid due to drug interaction.no need for further iv antibiotics. - health maintenance = will check for hiv  Paiden Cavell B. SWarrenfor Infectious Diseases [phone]

## 2014-04-08 NOTE — Progress Notes (Signed)
Pt reports "tingling" on his tongue when eating. Denies difficulty swallowing/SOB, no swelling noted, no dyspnea noted.  Pt does have a white coating on tongue but no other white patches noted to oral cavity. Dr Zigmund Daniel aware, no further orders at this time. Pt continuing to eat his dinner without difficulty.

## 2014-04-08 NOTE — Progress Notes (Signed)
Pt high dose Dilaudid PCA was DC. A 2mg  bolus dose was given before DC. Wasted 87ml down the sink with Elzie Rings, Therapist, sports as witness. Pharmacy notifed.

## 2014-04-08 NOTE — Progress Notes (Signed)
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 B/L pneumonia: CT angiogram resulterd and is negative for PE but showed findings consistent with Pneumonia vs PE. However b/l pleural effusions and clinical picture were more consistent with Pneumonia and treatment was initiated for CAP. He was initially treated for CAP with Rocephin and Azithromycin for 5 days. However he continued to have fevers and was changed to Cefipime and Linezalid and has been afebrile for the last 4 days. There is increased risk of Serotonin syndrome with Zyvox and Celexa. Will speak with ID regarding different antibiotic choice. 2. Hypoxemia: Improved. Oxygen saturations were 98-100% ambulating without Oxygen. 3. Hb SS with crisis: (There is some question as to whether this patient has HPFH per review of his records from [facility]) Pt continues to verbalize pain at 10/10. He appears comfortable in contrast to last week. He has independent bed mobility as well as ambulation (walked in hall without assistance) and he is able to weight bear on the LUE despite c/o pain. His use on PCA in the last 24 hours: 32.4 mg 62/54: demands/deliveries. I will transition to oral Dilaudid with IV for breakthrough in anticipation of discharge tomorrow.  4. Underweight: Pt appears emaciated and with muscle wasting. He reports that this is his normal body habitus. I requested nutrition consult however it was not completed according to notes due to inappropriate behaviors with nursing staff. Will re-consult nutrition as pat has a nutritional issue that significantly  impacts his acute and chronic health. 5. Leukocytosis: Secondary to Pneumonia.  6. Psych: Pt was evaluated by Psychiatry and diagnosed with depression, anxiety and insomnia. He is prescribed Lexapro 10 mg daily and Hydroxizine 50 mg q HS for anxiety and insomnia. Continue current medications. 7. Sickle Cell Disease: From a review of his records, He has had Pneumococcal 23-valent vaccine on 09/18/2008, Meningococcal Conjugate 12/20/2007, H1N1 12/20/2007.  8. Tachycardia: Improved. HR has been at highest 96 in the last 4 days. D/C telemetry. 9. Vitamin D Deficiency: Although this is a chronic problem and managed best in the out patient setting, it may be of some importance to supplement in light of his history of osteonecrosis and the severe pain hopefully represents only an infarct and not infection. 10. Anemia: Pt has a baseline Hb of 9.7. His Hb had decrease 2 grams which is expected during crisis. Hb currently at a low of 6.1. He does have mild tachycardia with ambulation. Will monitor Hb throughout the day and if HR continues to be elevated will consider transfusing 1 unit of PPRBC. 11. Constipation: Pt has not had a BM since admission and has not received any miralax as ordered. He continues on scheduled senekot. I have personally spoken with hsi nurse adn requested that Miralax be offered to the patient now. 12. Psychosocial: Per nursing staff, Pt hasnot had any further inappropriate innuendos toward staff and has been compliant. I spoke with Mr. Timothy Marks from [facility] about [name]. Mr Timothy Marks reports that he has been dismissed from the practice because of threatening behaviors and inappropriate sexual advances to a Physician. Mr. Timothy Marks does not recall Psychiatric evaluation in the past. However a review of  [name] records notes mention of a history of suicidal ideations. 13. Medication non-compliance: Pt reports compliance with Hydrea however it is not reflected in MCV.   Code Status: Full  Code Family Communication: N/A Disposition Plan: Not yet ready for discharge  [place].  Pager [phone]. If 7PM-7AM, please contact night-coverage.  04/08/2014, 8:46 AM  LOS: 9 days    Consultants:  Dr. Louretta Marks- Psychiatry  Procedures:  None  Antibiotics:  Rocephin 2/15>> 2/19  Azithromycin 2/16 >> 2/19  Cefipime 2/19 >>  Linezolid 2/19 >>   HPI/Subjective: Pt looks clinically improved today. HHe is able to move LUE against gravity and he was able to bear weight on BUE's during bed mobility. He was also able to get in and out of bed without assistance and ambulate down the hall without assistance.Last BM prior to admission per patient. Objective: Filed Vitals:   04/07/14 2159 04/08/14 0044 04/08/14 0156 04/08/14 0414  BP: 98/52  106/53   Pulse: 88  85   Temp: 99.5 F (37.5 C)  99 F (37.2 C)   TempSrc: Oral  Oral   Resp: 11 12 9 13   Height:      Weight:      SpO2: 100% 100% 100% 100%   Weight change:   Intake/Output Summary (Last 24 hours) at 04/08/14 0846 Last data filed at 04/08/14 0700  Gross per 24 hour  Intake   4930 ml  Output   2850 ml  Net   2080 ml    General: Alert, awake, very emaciated patient who is alert and  oriented x3, in no acute distress.  HEENT: [place]/AT PEERL, EOMI, anicteric OROPHARYNX:  Moist, No exudate/ erythema/lesions.  Heart: Regular rate and rhythm, without murmurs, rubs, gallops, PMI non-displaced, no heaves or thrills on palpation.  Lungs: Clear to auscultation, no wheezing or rhonchi noted and decreased air entry at bases. Mild increase in vocal fremitus and dulness to percussion noted at bases. Abdomen: Soft, nontender, nondistended, positive bowel sounds, no masses no hepatosplenomegaly noted.  Neuro: No focal neurological deficits noted cranial nerves II through XII grossly intact.  Strength unable to assess secondary to pain. Musculoskeletal: No warm swelling or erythema around joints, no spinal tenderness  noted. Psychiatric: Patient alert and oriented x3,his affect is normal and his tone very respectful today.  Lymph node survey: No cervical axillary or inguinal lymphadenopathy noted.   Data Reviewed: Basic Metabolic Panel:  Recent Labs Lab 04/03/14 0455 04/04/14 0540 04/05/14 0605 04/07/14 0550 04/08/14 0417  NA 136 138 135 135 134*  K 4.0 3.6 3.6 4.6 4.5  CL 99 98 96 96 93*  CO2 29 31 32 33* 33*  GLUCOSE 115* 107* 106* 102* 117*  BUN 5* <5* 6 7 12   CREATININE 0.44* 0.48* 0.45* 0.45* 0.51  CALCIUM 8.4 8.7 8.7 9.5 9.5   Liver Function Tests:  Recent Labs Lab 04/04/14 0540 04/05/14 0605 04/07/14 0550 04/08/14 0417  AST 33 25 25 55*  ALT 23 22 18 18   ALKPHOS 91 81 87 84  BILITOT 1.5* 1.7* 0.9 1.1  PROT 7.3 7.6 8.4* 9.0*  ALBUMIN 3.1* 3.3* 3.4* 3.4*   No results for input(s): LIPASE, AMYLASE in the last 168 hours. No results for input(s): AMMONIA in the last 168 hours. CBC:  Recent Labs Lab 04/03/14 0455 04/04/14 0540 04/05/14 0605 04/06/14 0602 04/07/14 0550 04/08/14 0417  WBC 11.8* 10.6* 10.3 12.3* 11.2* 13.0*  NEUTROABS 8.1* 6.6 7.1  --  8.3* 8.5*  HGB 6.3* 6.2*  6.1* 6.6* 6.5* 6.9*  HCT 18.3* 18.8* 18.4* 20.7* 20.6* 22.0*  MCV 83.9 82.5 81.1 81.8 82.1 82.7  PLT 292 368 436* 542* 782* 883*   Cardiac Enzymes: No results for input(s): CKTOTAL, CKMB, CKMBINDEX, TROPONINI in the last 168 hours. BNP (last 3 results) No results for input(s): BNP in the last 8760 hours.  ProBNP (last 3 results) No results for input(s): PROBNP in the last 8760 hours.  CBG: No results for input(s): GLUCAP in the last 168 hours.  Recent Results (from the past 240 hour(s))  Blood culture (routine x 2)     Status: None   Collection Time: 03/31/14  5:10 AM  Result Value Ref Range Status   Specimen Description BLOOD RIGHT ARM  Final   Special Requests BOTTLES DRAWN AEROBIC AND ANAEROBIC 4CC  Final   Culture   Final    NO GROWTH 5 DAYS Performed at [facility]     Report Status 04/06/2014 FINAL  Final  Culture, blood (routine x 2)     Status: None   Collection Time: 03/31/14 11:47 AM  Result Value Ref Range Status   Specimen Description BLOOD LEFT HAND  Final   Special Requests BOTTLES DRAWN AEROBIC ONLY 5CC  Final   Culture   Final    NO GROWTH 5 DAYS Performed at [facility]    Report Status 04/06/2014 FINAL  Final  Culture, expectorated sputum-assessment     Status: None   Collection Time: 04/04/14 10:52 AM  Result Value Ref Range Status   Specimen Description SPUTUM  Final   Special Requests NONE  Final   Sputum evaluation   Final    MICROSCOPIC FINDINGS SUGGEST THAT THIS SPECIMEN IS NOT REPRESENTATIVE OF LOWER RESPIRATORY SECRETIONS. PLEASE RECOLLECT. CALLED TO K. CAPES. RN AT [phone] ON 2.19.16 BY [email or web address].    Report Status 04/04/2014 FINAL  Final     Studies: Dg Chest 2 View  03/30/2014   CLINICAL DATA:  Sickle cell pain crisis. Diffuse pain. No shortness of breath or cough.  EXAM: CHEST  2 VIEW  COMPARISON:  Chest radiograph 10/28/2013.  FINDINGS: Stable prominent cardiac and mediastinal contours. Unchanged peribronchial thickening. No large consolidative pulmonary opacity. No pleural effusion or pneumothorax. Vertebral body changes compatible with history of sickle cell disease.  IMPRESSION: No acute cardiopulmonary process.   Electronically Signed   By: Lovey Newcomer M.D.   On: 03/30/2014 08:20   Ct Angio Chest Pe W/cm &/or Wo Cm  04/01/2014   CLINICAL DATA:  Hypoxia, tachycardia, hx of sickle cell dz, ? PE  EXAM: CT ANGIOGRAPHY CHEST WITH CONTRAST  TECHNIQUE: Multidetector CT imaging of the chest was performed using the standard protocol during bolus administration of intravenous contrast. Multiplanar CT image reconstructions and MIPs were obtained to evaluate the vascular anatomy.  CONTRAST:  153mL OMNIPAQUE IOHEXOL 350 MG/ML SOLN  COMPARISON:  10/27/2010  FINDINGS: The SVC is patent. The right atrium is nondilated. RV/LV ratio  normal, less than 1. Satisfactory opacification of pulmonary arteries noted, and there is no evidence of pulmonary emboli. Patent superior and inferior pulmonary veins bilaterally. Adequate contrast opacification of the thoracic aorta with no evidence of dissection, aneurysm, or stenosis. There is classic 3-vessel brachiocephalic arch anatomy without proximal stenosis.  Small bilateral pleural effusions. No pericardial effusion. No hilar or mediastinal adenopathy. Subpleural patchy atelectasis or scarring posteriorly in both lower lobes right greater than left. Sclerosis in multiple mid and lower thoracic vertebral segments with endplate changes characteristic of  sickle cell disease. Sternum intact. Small calcified spleen. Remainder visualized upper abdomen unremarkable.  Review of the MIP images confirms the above findings.  IMPRESSION: 1. Negative for acute PE or thoracic aortic dissection. 2. Small pleural effusions with adjacent consolidation/atelectasis posteriorly in the lower lobes, right greater than left. 3. Thoracic spine and splenic changes characteristic of sickle cell disease.   Electronically Signed   By: Lucrezia Europe M.D.   On: 04/01/2014 13:08   Dg Shoulder Left  04/07/2014   CLINICAL DATA:  Sickle cell crisis; pt c/o left shoulder pain since yesterday, no injury; pain is posterior; imaging performed supine due to fall risk--pt was unable to oblique for grashey view due to extreme pain  EXAM: LEFT SHOULDER - 2+ VIEW  COMPARISON:  None.  FINDINGS: There is no evidence of fracture or dislocation. There is no evidence of arthropathy or other focal bone abnormality. Soft tissues are unremarkable.  IMPRESSION: Negative.   Electronically Signed   By: Lajean Manes M.D.   On: 04/07/2014 17:05    Scheduled Meds: . antiseptic oral rinse  7 mL Mouth Rinse BID  . ceFEPime (MAXIPIME) IV  1 g Intravenous 3 times per day  . cholecalciferol  2,000 Units Oral Daily  . diclofenac sodium  2 g Topical QID  .  enoxaparin (LOVENOX) injection  40 mg Subcutaneous Q24H  . escitalopram  10 mg Oral Daily  . feeding supplement (ENSURE COMPLETE)  237 mL Oral TID BM  . folic acid  1 mg Oral Daily  . HYDROmorphone PCA 2 mg/mL   Intravenous 6 times per day  . hydrOXYzine  50 mg Oral QHS  . lactose free nutrition  237 mL Oral TID WC  . linezolid  600 mg Intravenous BID  . pneumococcal 23 valent vaccine  0.5 mL Intramuscular Tomorrow-1000  . senna-docusate  1 tablet Oral BID   Continuous Infusions: . 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

## 2014-04-08 NOTE — Progress Notes (Signed)
NUTRITION FOLLOW UP  Intervention:   - Continue Ensure to TID. - Placed patient on meal order with assist - Encourage adequate PO intake - RD will continue to monitor  Nutrition Dx:   Inadequate oral intake related to pain as evidenced by pt report; ongoing  Goal:   Pt to meet >/= 90% of their estimated nutrition needs   Monitor:   PO and supplemental intake, weight, labs, I/O's  Assessment:   24 y.o. male with a past medical history of sickle cell disease, anemia, hyperbilirubinemia. Pt reports not eating and drinking well over the past few days.  RD consulted for nutritional assessment. Pt initially assessed on 2/15 and followed-up on 2/18. Pt ordered Ensure supplements TID.   Per RN, pt has not been drinking supplements. Pt reports he has been drinking supplements and eating meals. PO intake continues to be variable: 0-100%.   RD checked [employer] to see if patient has been ordering meals, last food item ordered was last night (fruit loops), last meal provided to pt was on 2/21. RD to place pt on order with assist for better consistency of meal ordering.  Pt states that his UBW is 135-140 lb. Wt is stable per weight history documentation.   Pt presents with moderate to severe depletion in upper arm and clavicle regions. Pt has mild depletion in temporal areas.  Pt agreed to try and drink as much of his supplements as he can and eat his meals.  Pt meets criteria for severe MALNUTRITION in the context of acute illness as evidenced by energy intake <50% for >/=5 days and moderate to severe muscle and fat depletion.  Labs reviewed:  Low Na  Height: Ht Readings from Last 1 Encounters:  03/30/14 6\' 2"  (1.88 m)    Weight Status:   Wt Readings from Last 1 Encounters:  03/30/14 135 lb (61.236 kg)    Re-estimated needs:  Kcal: 1800-2000 Protein: 90-100 g Fluid: 1.8 L/day  Skin: intact  Diet Order: Diet regular   Intake/Output Summary (Last 24 hours) at 04/08/14  1352 Last data filed at 04/08/14 1319  Gross per 24 hour  Intake   3030 ml  Output   2900 ml  Net    130 ml    Last BM: PTA   Labs:   Recent Labs Lab 04/05/14 0605 04/07/14 0550 04/08/14 0417  NA 135 135 134*  K 3.6 4.6 4.5  CL 96 96 93*  CO2 32 33* 33*  BUN 6 7 12   CREATININE 0.45* 0.45* 0.51  CALCIUM 8.7 9.5 9.5  GLUCOSE 106* 102* 117*    CBG (last 3)  No results for input(s): GLUCAP in the last 72 hours.  Scheduled Meds: . antiseptic oral rinse  7 mL Mouth Rinse BID  . ceFEPime (MAXIPIME) IV  1 g Intravenous 3 times per day  . cholecalciferol  2,000 Units Oral Daily  . diclofenac sodium  2 g Topical QID  . enoxaparin (LOVENOX) injection  40 mg Subcutaneous Q24H  . escitalopram  10 mg Oral Daily  . feeding supplement (ENSURE COMPLETE)  237 mL Oral TID BM  . folic acid  1 mg Oral Daily  . hydrOXYzine  50 mg Oral QHS  . lactose free nutrition  237 mL Oral TID WC  . linezolid  600 mg Intravenous BID  . oxyCODONE-acetaminophen  1 tablet Oral Q4H   And  . oxyCODONE  5 mg Oral Q4H  . pneumococcal 23 valent vaccine  0.5 mL Intramuscular Tomorrow-1000  .  senna-docusate  1 tablet Oral BID    Continuous Infusions: . dextrose 5 % and 0.45% NaCl 100 mL/hr at 04/08/14 0609    Clayton Bibles, MS, RD, LDN Pager: [phone] After Hours Pager: [phone]

## 2014-04-09 ENCOUNTER — Encounter: Payer: Self-pay | Admitting: Internal Medicine

## 2014-04-09 DIAGNOSIS — F320 Major depressive disorder, single episode, mild: Secondary | ICD-10-CM

## 2014-04-09 DIAGNOSIS — R7989 Other specified abnormal findings of blood chemistry: Secondary | ICD-10-CM

## 2014-04-09 DIAGNOSIS — E43 Unspecified severe protein-calorie malnutrition: Secondary | ICD-10-CM

## 2014-04-09 LAB — HIV ANTIBODY (ROUTINE TESTING W REFLEX): HIV Screen 4th Generation wRfx: NONREACTIVE

## 2014-04-09 LAB — CBC WITH DIFFERENTIAL/PLATELET
Basophils Absolute: 0 10*3/uL (ref 0.0–0.1)
Basophils Relative: 0 % (ref 0–1)
Eosinophils Absolute: 0.1 10*3/uL (ref 0.0–0.7)
Eosinophils Relative: 1 % (ref 0–5)
HCT: 21.3 % — ABNORMAL LOW (ref 39.0–52.0)
Hemoglobin: 6.7 g/dL — CL (ref 13.0–17.0)
Lymphocytes Relative: 23 % (ref 12–46)
Lymphs Abs: 3 10*3/uL (ref 0.7–4.0)
MCH: 25.6 pg — ABNORMAL LOW (ref 26.0–34.0)
MCHC: 31.5 g/dL (ref 30.0–36.0)
MCV: 81.3 fL (ref 78.0–100.0)
Monocytes Absolute: 1.2 10*3/uL — ABNORMAL HIGH (ref 0.1–1.0)
Monocytes Relative: 9 % (ref 3–12)
Neutro Abs: 8.7 10*3/uL — ABNORMAL HIGH (ref 1.7–7.7)
Neutrophils Relative %: 67 % (ref 43–77)
Platelets: 1052 10*3/uL (ref 150–400)
RBC: 2.62 MIL/uL — ABNORMAL LOW (ref 4.22–5.81)
RDW: 26.3 % — ABNORMAL HIGH (ref 11.5–15.5)
WBC: 13 10*3/uL — ABNORMAL HIGH (ref 4.0–10.5)

## 2014-04-09 LAB — BASIC METABOLIC PANEL
Anion gap: 10 (ref 5–15)
BUN: 14 mg/dL (ref 6–23)
CO2: 27 mmol/L (ref 19–32)
Calcium: 9.6 mg/dL (ref 8.4–10.5)
Chloride: 99 mmol/L (ref 96–112)
Creatinine, Ser: 0.63 mg/dL (ref 0.50–1.35)
GFR calc Af Amer: >90 mL/min (ref >90)
GFR calc non Af Amer: >90 mL/min (ref >90)
Glucose, Bld: 109 mg/dL — ABNORMAL HIGH (ref 70–99)
Potassium: 4.3 mmol/L (ref 3.5–5.1)
Sodium: 136 mmol/L (ref 135–145)

## 2014-04-09 LAB — RETICULOCYTES
RBC.: 2.62 MIL/uL — ABNORMAL LOW (ref 4.22–5.81)
Retic Count, Absolute: 552.8 10*3/uL — ABNORMAL HIGH (ref 19.0–186.0)
Retic Ct Pct: 21.1 % — ABNORMAL HIGH (ref 0.4–3.1)

## 2014-04-09 MED ORDER — ESCITALOPRAM OXALATE 10 MG PO TABS: 10.0000 mg | ORAL_TABLET | Freq: Every day | ORAL | Status: DC

## 2014-04-09 MED ORDER — ASPIRIN 81 MG PO CHEW
81.0000 mg | CHEWABLE_TABLET | Freq: Every day | ORAL | Status: DC
  Administered 2014-04-09: 81 mg via ORAL
  Filled 2014-04-09: qty 1

## 2014-04-09 MED ORDER — POLYETHYLENE GLYCOL 3350 17 G PO PACK: 17.0000 g | PACK | Freq: Every day | ORAL | Status: DC | PRN

## 2014-04-09 MED ORDER — SODIUM CHLORIDE 0.9 % IV SOLN
Freq: Once | INTRAVENOUS | Status: AC
Start: 2014-04-09 — End: 2014-04-09

## 2014-04-09 MED ORDER — OXYCODONE-ACETAMINOPHEN 10-325 MG PO TABS: 1.0000 | ORAL_TABLET | ORAL | Status: DC | PRN

## 2014-04-09 MED ORDER — ASPIRIN 81 MG PO CHEW: 81.0000 mg | CHEWABLE_TABLET | Freq: Every day | ORAL | Status: DC

## 2014-04-09 MED ORDER — SENNOSIDES-DOCUSATE SODIUM 8.6-50 MG PO TABS: 1.0000 | ORAL_TABLET | Freq: Two times a day (BID) | ORAL | Status: DC

## 2014-04-09 MED ORDER — ENSURE COMPLETE PO LIQD: 237.0000 mL | Freq: Three times a day (TID) | ORAL | Status: DC

## 2014-04-09 MED ORDER — VITAMIN D3 25 MCG (1000 UNIT) PO TABS: 2000.0000 [IU] | ORAL_TABLET | Freq: Every day | ORAL | Status: DC

## 2014-04-09 MED ORDER — CEFUROXIME AXETIL 500 MG PO TABS: 500.0000 mg | ORAL_TABLET | Freq: Two times a day (BID) | ORAL | Status: DC

## 2014-04-09 MED ORDER — DICLOFENAC SODIUM 1 % TD GEL: 2.0000 g | Freq: Four times a day (QID) | TRANSDERMAL | Status: DC

## 2014-04-09 MED ORDER — HYDROXYZINE HCL 50 MG PO TABS: 50.0000 mg | ORAL_TABLET | Freq: Every day | ORAL | Status: DC

## 2014-04-09 NOTE — Progress Notes (Signed)
Clinical Social Work  CSW reviewed chart which stated that patient was ready to DC today. CSW met with patient at bedside to discuss plans for follow up for psychiatric care. Patient reports it is important to have follow up in [facility] and he would prefer an agency close to the bus line because sometimes he does not have reliable transportation. CSW provided information for [facility] [place] and can go to the walk-in clinic between 8:30am-12pm or 2pm-3pm. Patient reports he wants to go home and get settled so he feels better prior to going to walk-in clinic. Patient reports depression and anxiety have improved and that he has been eating and sleeping well.   Patient minimally engaged during assessment with limited eye contact. Patient was using his phone during assessment and reports his family will assist as needed. Patient denies any further needs and reports understanding for outpatient follow up.  [place], [place] [phone]

## 2014-04-09 NOTE — Discharge Summary (Signed)
Abdo Denault MRN: [id number] DOB/AGE: 1990/09/15 23 y.o.  Admit date: 03/30/2014 Discharge date: 04/09/2014  Primary Care Physician:  [place]   Discharge Diagnoses:   Patient Active Problem List   Diagnosis Date Noted  . Protein-calorie malnutrition, severe 04/09/2014  . Major depressive disorder, recurrent, severe without psychotic features   . 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 Vitamin D deficiency   . Sickle cell pain crisis 03/30/2014  . Sickle cell anemia 03/30/2014  . Hyperbilirubinemia 03/30/2014  . Hypokalemia 02/07/2014  . Sickle cell crisis 03/03/2013  . Aggressive behavior 01/25/2013    DISCHARGE MEDICATION:   Medication List    STOP taking these medications        DSS 100 MG Caps    hydroxyurea 500 MG capsule  Commonly known as:  HYDREA  Take 500 mg by mouth 2 (two) times daily. May take with food to minimize GI side effects.      TAKE these medications        Aspirin EC 81 mg tablet  Take one tablet (81 mg) by mouth daily    cholecalciferol 1000 UNITS tablet  Commonly known as:  VITAMIN D  Take 2 tablets (2,000 Units total) by mouth daily.     diclofenac sodium 1 % Gel  Commonly known as:  VOLTAREN  Apply 2 g topically 4 (four) times daily.     escitalopram 10 MG tablet  Commonly known as:  LEXAPRO  Take 1 tablet (10 mg total) by mouth daily.     feeding supplement (ENSURE COMPLETE) Liqd  Take 237 mLs by mouth 3 (three) times daily between meals.     folic acid 1 MG tablet  Commonly known as:  FOLVITE  Take 1 mg by mouth daily.     hydrOXYzine 50 MG tablet  Commonly known as:  ATARAX/VISTARIL  Take 1 tablet (50 mg total) by mouth at bedtime.     ibuprofen 800 MG tablet  Commonly known as:  ADVIL,MOTRIN  Take 1 tablet (800 mg total) by mouth 3 (three) times daily.     methocarbamol 500 MG tablet  Commonly known as:  ROBAXIN  Take 1 tablet (500 mg total) by mouth 2 (two)  times daily.     oxyCODONE-acetaminophen 10-325 MG per tablet  Commonly known as:  PERCOCET/ROXICET  Take 1 tablet by mouth every 4 (four) hours as needed for moderate pain.     polyethylene glycol packet  Commonly known as:  MIRALAX / GLYCOLAX  Take 17 g by mouth daily as needed for mild constipation.     promethazine 25 MG tablet  Commonly known as:  PHENERGAN  Take 25 mg by mouth every 6 (six) hours as needed for nausea. For nausea     senna-docusate 8.6-50 MG per tablet  Commonly known as:  Senokot-S  Take 1 tablet by mouth 2 (two) times daily.          Consults: Treatment Team:  Durward Parcel, MD   SIGNIFICANT DIAGNOSTIC STUDIES:  Dg Chest 2 View  03/30/2014   CLINICAL DATA:  Sickle cell pain crisis. Diffuse pain. No shortness of breath or cough.  EXAM: CHEST  2 VIEW  COMPARISON:  Chest radiograph 10/28/2013.  FINDINGS: Stable prominent cardiac and mediastinal contours. Unchanged peribronchial thickening. No large consolidative pulmonary opacity. No pleural effusion or pneumothorax. Vertebral body changes compatible with history of sickle cell disease.  IMPRESSION: No acute cardiopulmonary process.   Electronically Signed  By: Lovey Newcomer M.D.   On: 03/30/2014 08:20   Ct Angio Chest Pe W/cm &/or Wo Cm  04/01/2014   CLINICAL DATA:  Hypoxia, tachycardia, hx of sickle cell dz, ? PE  EXAM: CT ANGIOGRAPHY CHEST WITH CONTRAST  TECHNIQUE: Multidetector CT imaging of the chest was performed using the standard protocol during bolus administration of intravenous contrast. Multiplanar CT image reconstructions and MIPs were obtained to evaluate the vascular anatomy.  CONTRAST:  112mL OMNIPAQUE IOHEXOL 350 MG/ML SOLN  COMPARISON:  10/27/2010  FINDINGS: The SVC is patent. The right atrium is nondilated. RV/LV ratio normal, less than 1. Satisfactory opacification of pulmonary arteries noted, and there is no evidence of pulmonary emboli. Patent superior and inferior pulmonary veins  bilaterally. Adequate contrast opacification of the thoracic aorta with no evidence of dissection, aneurysm, or stenosis. There is classic 3-vessel brachiocephalic arch anatomy without proximal stenosis.  Small bilateral pleural effusions. No pericardial effusion. No hilar or mediastinal adenopathy. Subpleural patchy atelectasis or scarring posteriorly in both lower lobes right greater than left. Sclerosis in multiple mid and lower thoracic vertebral segments with endplate changes characteristic of sickle cell disease. Sternum intact. Small calcified spleen. Remainder visualized upper abdomen unremarkable.  Review of the MIP images confirms the above findings.  IMPRESSION: 1. Negative for acute PE or thoracic aortic dissection. 2. Small pleural effusions with adjacent consolidation/atelectasis posteriorly in the lower lobes, right greater than left. 3. Thoracic spine and splenic changes characteristic of sickle cell disease.   Electronically Signed   By: Lucrezia Europe M.D.   On: 04/01/2014 13:08   Dg Shoulder Left  04/07/2014   CLINICAL DATA:  Sickle cell crisis; pt c/o left shoulder pain since yesterday, no injury; pain is posterior; imaging performed supine due to fall risk--pt was unable to oblique for grashey view due to extreme pain  EXAM: LEFT SHOULDER - 2+ VIEW  COMPARISON:  None.  FINDINGS: There is no evidence of fracture or dislocation. There is no evidence of arthropathy or other focal bone abnormality. Soft tissues are unremarkable.  IMPRESSION: Negative.   Electronically Signed   By: Lajean Manes M.D.   On: 04/07/2014 17:05         Recent Results (from the past 240 hour(s))  Blood culture (routine x 2)     Status: None   Collection Time: 03/31/14  5:10 AM  Result Value Ref Range Status   Specimen Description BLOOD RIGHT ARM  Final   Special Requests BOTTLES DRAWN AEROBIC AND ANAEROBIC 4CC  Final   Culture   Final    NO GROWTH 5 DAYS Performed at [facility]    Report Status  04/06/2014 FINAL  Final  Culture, blood (routine x 2)     Status: None   Collection Time: 03/31/14 11:47 AM  Result Value Ref Range Status   Specimen Description BLOOD LEFT HAND  Final   Special Requests BOTTLES DRAWN AEROBIC ONLY 5CC  Final   Culture   Final    NO GROWTH 5 DAYS Performed at [facility]    Report Status 04/06/2014 FINAL  Final  Culture, expectorated sputum-assessment     Status: None   Collection Time: 04/04/14 10:52 AM  Result Value Ref Range Status   Specimen Description SPUTUM  Final   Special Requests NONE  Final   Sputum evaluation   Final    MICROSCOPIC FINDINGS SUGGEST THAT THIS SPECIMEN IS NOT REPRESENTATIVE OF LOWER RESPIRATORY SECRETIONS. PLEASE RECOLLECT. CALLED TO K. CAPES. RN  AT [phone] ON 2.19.16 BY [email or web address].    Report Status 04/04/2014 FINAL  Final    BRIEF ADMITTING H & P: Taivon Haroon is a 24 y.o. male with a past medical history of sickle cell disease, anemia, hyperbilirubinemia, who has had multiple visits to the emergency department, both here in [place], as well as in [place]. He comes in with the above-mentioned complaints. This has been ongoing for 2 days. He was seen in the emergency department last night and was treated with pain medications. He was discharged in satisfactory condition. But the pain recurred this morning. He decided to come back. Pain is sharp pain 9 out of 10 in intensity and feels like his usual sickle cell pain. He denies any fever, chills, no diarrhea, no nausea, vomiting. Denies any falls or injuries recently. Urinating without difficulties. Is able to ambulate.   Hospital Course:  Present on Admission:  . Hb SS with crisis: Pt admitted with acute pain crisis. He was treated with Dilaudid via PCA and clinician assisted doses, Toradol and IVF. He developed a fever whic prolonged his hospitalization. He was transitioned to oral Dilaudid but refused reporting that this medication has been ineffective for him  in the past. The oral medication was changed to Percocet 10-325 mg. Pt will be discharged on Percocet 10-325 mg . He received a prescription at the time of discharge  For Percocet 10-325 mg #60 tabs. Patient is to follow up with his Primary Medical Provider for further management of his pain associated with Sickle Cell Disease.  . Pneumonia: Pt was found to have pneumonia on CT of the chest. He was treated empirically for CAP as he had had no hospitalizations in the last 5 months. He initially defervesced  But then spiked a fever after 4 days on the antibiotics. The antibiotics were escalated to HCAP coverage by the treating Physician. ID was consulted and recommended changing antibiotics to Cefuroxime 500 mg BID to complete course.   . Fever: Pt developed a fever Tmax of 102.9. Blood cultures taken were negative at the time of discharge as was Urinalysis. A CT scan revealed findings consistent with Pneumonia. He was treated empirically for CAP as he had had no hospitalizations in the last 5 months. He initially defervesced  But then spiked a fever after 4 days on the antibiotics. The antibiotics were escalated to HCAP coverage by the treating Physician. ID was consulted and recommended changing antibiotics to Cefuroxime 500 mg BID to complete course.  . 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 With the development of fever and pain in the left shoulder, there was some concern for this as a possible nidus for infection. An X-ray of the shoulder was performed and this was negative for any acute findings. Pt's pain improved in the shoulder and he is able to move LUE without significant difficulty beyond the limitations of pain.  . Severe Protein Calorie Malnutrition: Pt is severely malnourished. He was seen by Nutritionist who recommends protein  supplements. However what id more concerning is the reason for his malnutrition. He will require a work-up as an out-patient. An HIV test is pending.  . Depression: Pt exhibited manic, hypersexual behaviors upon admission. Psychiatry was consulted and diagnosed with Major Depressive Disorder. He was started on Lexapro and Vistaril and disposition of Home with out patient Psychiatry.  Marland Kitchen  Anemia of Chronic disease: Pt had a baseline Hb of 8.7 more than 1 year ago. However this was after a transfusion. He is not clear what his Hb has been in the last year. His  Hb was 6.7 and reticulocyte 21% prior to discharge. He received a transfusion of 1 Unit PRBC during this hospitalization. She will need follow up labs in about 1 week (CBC with diff).  . Sickle cell anemia: Pt was previously seen by [facility] for management of Sickle Cell Disease. He was dismissed from their practice and it is unclear what degree of care he is receiving for hid Disease. He reports that he is still taking Hydrea from prescription s that he has had in the past but this does not appear to be managed by anyone in particular. I will ask the [facility] to make contact with the patient try to help him with access to care. I have tried multiple times to reach someone at the [facility] in [facility] at [phone] without success. I have left multiple messages without any response. I do not feel it is safe to continue Hydrea without any reliable follow up. I wil stop Hydrea and this can be restarted as on out patient.  .  Vitamin D deficiency: Pt reported a h/o vitamin D deficiency and was restarted on supplements during this hospitalization. I am discharging patient on Vitamin D 2000 IU daily. Recommend check Vitamin D levels in 6 weeks  . Thrombocytosis: Pt had a thrombocytosis which is likely reactive. Will recommend Aspirin  81 mg daily    .Disposition and Follow-up: Pt is discharged in  stable condition and is to follow up with his PMD in 3 days.   Due to my inability to contact a person at [facility] and after leaving several messages, I called [facility] who were able to contact [facility]. The office manager at [facility] reports that the Providers are in a meeting until 10:00am. Dr. Alroy Dust called the office adn I have returned his call at [phone] and left a message on the voice mail.   DISCHARGE EXAM:  General: Very emaciated chronically ill appearing young man. Alert, awake, oriented x3, in no acute distress.  Vital Signs: BP 112/61, HR 93, T 98.8 F (37.1 C), temperature source Oral, RR 20, height 6\' 2"  (1.88 m), weight 135 lb (61.236 kg), SpO2 98 %. HEENT: [place]/AT PEERL, EOMI, anicteric Neck: Trachea midline, no masses, no thyromegal,y no JVD, no carotid bruit OROPHARYNX: Moist, No exudate/ erythema/lesions.  Heart: Regular rate and rhythm, without murmurs, rubs, gallops, PMI non-displaced.  Lungs: Clear to auscultation, no wheezing or rhonchi noted.  Abdomen: Soft, nontender, nondistended, positive bowel sounds, no masses no hepatosplenomegaly noted.  Neuro: No focal neurological deficits noted cranial nerves II through XII grossly intact.  Strength at baseline in bilateral upper and lower extremities. Musculoskeletal: No warm swelling or erythema around joints, no spinal tenderness noted. Psychiatric: Patient alert and oriented x3,  good recent to remote recall. Lymph node survey: No cervical axillary or inguinal lymphadenopathy noted.   Recent Labs  04/07/14 0550 04/08/14 0417 04/09/14 0932  NA 135 134* 136  K 4.6 4.5 4.3  CL 96 93* 99  CO2 33* 33* 27  GLUCOSE 102* 117* 109  BUN 7 12 14   CREATININE 0.45* 0.51 0.63  CALCIUM 9.5 9.5     Recent Labs  04/07/14 0550 04/08/14 0417  AST 25 55*  ALT 18 18  ALKPHOS  87 84  BILITOT 0.9 1.1  PROT 8.4* 9.0*  ALBUMIN 3.4* 3.4*   No results for input(s): LIPASE, AMYLASE in the last 72 hours.  Recent  Labs  04/07/14 0550 04/08/14 0417 04/09/14 0932  WBC 11.2* 13.0* 13  NEUTROABS 8.3* 8.5* 8.7  HGB 6.5* 6.9* 6.7  HCT 20.6* 22.0* 21.3  MCV 82.1 82.7 81.3  PLT 782* 883* 1052   Total time spent including face to face and decision making was greater than 30 minutes  Signed: Antwyne Pingree A. 04/09/2014, 7:58 AM

## 2014-04-09 NOTE — Progress Notes (Signed)
Patient educated regarding his discharge plan. Prescriptions given.
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
## 2014-04-10 LAB — TYPE AND SCREEN
ABO/RH(D): A POS
Antibody Screen: NEGATIVE
Donor AG Type: NEGATIVE
Unit division: 0

## 2014-05-05 ENCOUNTER — Inpatient Hospital Stay (HOSPITAL_COMMUNITY)
Admission: EM | Admit: 2014-05-05 | Discharge: 2014-05-11 | DRG: 811 | Disposition: A | Discharge status: Home / Self Care | Payer: Medicaid Other | Attending: Internal Medicine | Admitting: Internal Medicine

## 2014-05-05 ENCOUNTER — Emergency Department (HOSPITAL_COMMUNITY): Payer: Medicaid Other

## 2014-05-05 ENCOUNTER — Encounter (HOSPITAL_COMMUNITY): Payer: Self-pay | Admitting: Emergency Medicine

## 2014-05-05 ENCOUNTER — Encounter (HOSPITAL_COMMUNITY): Payer: Self-pay | Admitting: Family Medicine

## 2014-05-05 ENCOUNTER — Emergency Department (HOSPITAL_COMMUNITY)
Admission: EM | Admit: 2014-05-05 | Discharge: 2014-05-05 | Disposition: A | Discharge status: Home / Self Care | Payer: Medicaid Other | Source: Home / Self Care | Attending: Emergency Medicine | Admitting: Emergency Medicine

## 2014-05-05 DIAGNOSIS — Z7982 Long term (current) use of aspirin: Secondary | ICD-10-CM

## 2014-05-05 DIAGNOSIS — Z9049 Acquired absence of other specified parts of digestive tract: Secondary | ICD-10-CM | POA: Diagnosis present

## 2014-05-05 DIAGNOSIS — R509 Fever, unspecified: Secondary | ICD-10-CM

## 2014-05-05 DIAGNOSIS — M90512 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, left shoulder: Secondary | ICD-10-CM | POA: Diagnosis present

## 2014-05-05 DIAGNOSIS — Z791 Long term (current) use of non-steroidal anti-inflammatories (NSAID): Secondary | ICD-10-CM

## 2014-05-05 DIAGNOSIS — M25562 Pain in left knee: Secondary | ICD-10-CM

## 2014-05-05 DIAGNOSIS — R079 Chest pain, unspecified: Secondary | ICD-10-CM

## 2014-05-05 DIAGNOSIS — D571 Sickle-cell disease without crisis: Secondary | ICD-10-CM

## 2014-05-05 DIAGNOSIS — D5700 Hb-SS disease with crisis, unspecified: Secondary | ICD-10-CM | POA: Insufficient documentation

## 2014-05-05 DIAGNOSIS — N4830 Priapism, unspecified: Secondary | ICD-10-CM | POA: Diagnosis present

## 2014-05-05 DIAGNOSIS — E559 Vitamin D deficiency, unspecified: Secondary | ICD-10-CM

## 2014-05-05 DIAGNOSIS — Z79899 Other long term (current) drug therapy: Secondary | ICD-10-CM | POA: Insufficient documentation

## 2014-05-05 DIAGNOSIS — E43 Unspecified severe protein-calorie malnutrition: Secondary | ICD-10-CM | POA: Diagnosis present

## 2014-05-05 DIAGNOSIS — M879 Osteonecrosis, unspecified: Secondary | ICD-10-CM | POA: Diagnosis present

## 2014-05-05 DIAGNOSIS — Z681 Body mass index (BMI) 19 or less, adult: Secondary | ICD-10-CM

## 2014-05-05 DIAGNOSIS — E876 Hypokalemia: Secondary | ICD-10-CM | POA: Diagnosis present

## 2014-05-05 DIAGNOSIS — M25462 Effusion, left knee: Secondary | ICD-10-CM

## 2014-05-05 LAB — COMPREHENSIVE METABOLIC PANEL
ALT: 11 U/L (ref 0–53)
AST: 22 U/L (ref 0–37)
Albumin: 3.9 g/dL (ref 3.5–5.2)
Alkaline Phosphatase: 92 U/L (ref 39–117)
Anion gap: 9 (ref 5–15)
BUN: <5 mg/dL — ABNORMAL LOW (ref 6–23)
CO2: 25 mmol/L (ref 19–32)
Calcium: 9 mg/dL (ref 8.4–10.5)
Chloride: 105 mmol/L (ref 96–112)
Creatinine, Ser: 0.61 mg/dL (ref 0.50–1.35)
GFR calc Af Amer: >90 mL/min (ref >90)
GFR calc non Af Amer: >90 mL/min (ref >90)
Glucose, Bld: 87 mg/dL (ref 70–99)
Potassium: 3.6 mmol/L (ref 3.5–5.1)
Sodium: 139 mmol/L (ref 135–145)
Total Bilirubin: 2.4 mg/dL — ABNORMAL HIGH (ref 0.3–1.2)
Total Protein: 7.3 g/dL (ref 6.0–8.3)

## 2014-05-05 LAB — RETICULOCYTES
RBC.: 4.13 MIL/uL — ABNORMAL LOW (ref 4.22–5.81)
Retic Count, Absolute: 293.2 10*3/uL — ABNORMAL HIGH (ref 19.0–186.0)
Retic Ct Pct: 7.1 % — ABNORMAL HIGH (ref 0.4–3.1)

## 2014-05-05 LAB — CBC WITH DIFFERENTIAL/PLATELET
Basophils Absolute: 0 10*3/uL (ref 0.0–0.1)
Basophils Relative: 0 % (ref 0–1)
Eosinophils Absolute: 0.1 10*3/uL (ref 0.0–0.7)
Eosinophils Relative: 1 % (ref 0–5)
HCT: 34.3 % — ABNORMAL LOW (ref 39.0–52.0)
Hemoglobin: 11.6 g/dL — ABNORMAL LOW (ref 13.0–17.0)
Lymphocytes Relative: 25 % (ref 12–46)
Lymphs Abs: 2.6 10*3/uL (ref 0.7–4.0)
MCH: 28.1 pg (ref 26.0–34.0)
MCHC: 33.8 g/dL (ref 30.0–36.0)
MCV: 83.1 fL (ref 78.0–100.0)
Monocytes Absolute: 0.7 10*3/uL (ref 0.1–1.0)
Monocytes Relative: 7 % (ref 3–12)
Neutro Abs: 7.1 10*3/uL (ref 1.7–7.7)
Neutrophils Relative %: 67 % (ref 43–77)
Platelets: 441 10*3/uL — ABNORMAL HIGH (ref 150–400)
RBC: 4.13 MIL/uL — ABNORMAL LOW (ref 4.22–5.81)
RDW: 22.8 % — ABNORMAL HIGH (ref 11.5–15.5)
WBC: 10.5 10*3/uL (ref 4.0–10.5)

## 2014-05-05 MED ORDER — FENTANYL CITRATE 0.05 MG/ML IJ SOLN
100.0000 ug | Freq: Once | INTRAMUSCULAR | Status: DC
  Filled 2014-05-05: qty 2

## 2014-05-05 MED ORDER — HYDROMORPHONE HCL 1 MG/ML IJ SOLN
2.0000 mg | INTRAMUSCULAR | Status: DC | PRN
  Administered 2014-05-05 (×2): 2 mg via INTRAVENOUS
  Filled 2014-05-05 (×2): qty 2

## 2014-05-05 MED ORDER — SODIUM CHLORIDE 0.9 % IV BOLUS (SEPSIS)
1000.0000 mL | Freq: Once | INTRAVENOUS | Status: AC
  Administered 2014-05-05: 1000 mL via INTRAVENOUS

## 2014-05-05 MED ORDER — OXYCODONE-ACETAMINOPHEN 5-325 MG PO TABS
2.0000 | ORAL_TABLET | Freq: Once | ORAL | Status: AC
  Administered 2014-05-05: 2 via ORAL
  Filled 2014-05-05: qty 2

## 2014-05-05 MED ORDER — ONDANSETRON HCL 4 MG/2ML IJ SOLN
4.0000 mg | Freq: Once | INTRAMUSCULAR | Status: DC
  Filled 2014-05-05: qty 2

## 2014-05-05 NOTE — Discharge Instructions (Signed)
Sickle Cell Anemia, Adult °Sickle cell anemia is a condition in which red blood cells have an abnormal "sickle" shape. This abnormal shape shortens the cells' life span, which results in a lower than normal concentration of red blood cells in the blood. The sickle shape also causes the cells to clump together and block free blood flow through the blood vessels. As a result, the tissues and organs of the body do not receive enough oxygen. Sickle cell anemia causes organ damage and pain and increases the risk of infection. °CAUSES  °Sickle cell anemia is a genetic disorder. Those who receive two copies of the gene have the condition, and those who receive one copy have the trait. °RISK FACTORS °The sickle cell gene is most common in people whose families originated in [place]. Other areas of the globe where sickle cell trait occurs include the [place], [place] and [place], the [place], and the [place].  °SIGNS AND SYMPTOMS °· Pain, especially in the extremities, back, chest, or abdomen (common). The pain may start suddenly or may develop following an illness, especially if there is dehydration. Pain can also occur due to overexertion or exposure to extreme temperature changes. °· Frequent severe bacterial infections, especially certain types of pneumonia and meningitis. °· Pain and swelling in the hands and feet. °· Decreased activity.   °· Loss of appetite.   °· Change in behavior. °· Headaches. °· Seizures. °· Shortness of breath or difficulty breathing. °· Vision changes. °· Skin ulcers. °Those with the trait may not have symptoms or they may have mild symptoms.  °DIAGNOSIS  °Sickle cell anemia is diagnosed with blood tests that demonstrate the genetic trait. It is often diagnosed during the newborn period, due to mandatory testing nationwide. A variety of blood tests, X-rays, CT scans, MRI scans, ultrasounds, and lung function tests may also be done to monitor the condition. °TREATMENT  °Sickle  cell anemia may be treated with: °· Medicines. You may be given pain medicines, antibiotic medicines (to treat and prevent infections) or medicines to increase the production of certain types of hemoglobin. °· Fluids. °· Oxygen. °· Blood transfusions. °HOME CARE INSTRUCTIONS  °· Drink enough fluid to keep your urine clear or pale yellow. Increase your fluid intake in hot weather and during exercise. °· Do not smoke. Smoking lowers oxygen levels in the blood.   °· Only take over-the-counter or prescription medicines for pain, fever, or discomfort as directed by your health care provider. °· Take antibiotics as directed by your health care provider. Make sure you finish them it even if you start to feel better.   °· Take supplements as directed by your health care provider.   °· Consider wearing a medical alert bracelet. This tells anyone caring for you in an emergency of your condition.   °· When traveling, keep your medical information, health care provider's names, and the medicines you take with you at all times.   °· If you develop a fever, do not take medicines to reduce the fever right away. This could cover up a problem that is developing. Notify your health care provider. °· Keep all follow-up appointments with your health care provider. Sickle cell anemia requires regular medical care. °SEEK MEDICAL CARE IF: ° You have a fever. °SEEK IMMEDIATE MEDICAL CARE IF:  °· You feel dizzy or faint.   °· You have new abdominal pain, especially on the left side near the stomach area.   °· You develop a persistent, often uncomfortable and painful penile erection (priapism). If this is not treated immediately it   will lead to impotence.   °· You have numbness your arms or legs or you have a hard time moving them.   °· You have a hard time with speech.   °· You have a fever or persistent symptoms for more than 2-3 days.   °· You have a fever and your symptoms suddenly get worse.   °· You have signs or symptoms of infection.  These include:   °¨ Chills.   °¨ Abnormal tiredness (lethargy).   °¨ Irritability.   °¨ Poor eating.   °¨ Vomiting.   °· You develop pain that is not helped with medicine.   °· You develop shortness of breath. °· You have pain in your chest.   °· You are coughing up pus-like or bloody sputum.   °· You develop a stiff neck. °· Your feet or hands swell or have pain. °· Your abdomen appears bloated. °· You develop joint pain. °MAKE SURE YOU: °· Understand these instructions. °Document Released: 05/11/2005 Document Revised: 06/17/2013 Document Reviewed: 09/12/2012 °Exit[employer]® Patient Information ©2015 [employer]. This information is not intended to replace advice given to you by your health care provider. Make sure you discuss any questions you have with your health care provider. ° °

## 2014-05-05 NOTE — ED Notes (Signed)
Patient is in room crying while in room on the phone with his girlfriend. Vitals are charted.

## 2014-05-05 NOTE — ED Notes (Signed)
Pt was seen earlier today for leg and back pain related to sickle cell crisis at [facility]. Discharged home. Pain after 1 hour of being at home. BIB ems who states he was on phone with HR in 60s on the way here. Also states that he had bigemeny in route but converted after vaso-vagaling. Alert and oriented at present.

## 2014-05-05 NOTE — ED Provider Notes (Signed)
CSN: [id number]     Arrival date & time 05/05/14  1321 History   First MD Initiated Contact with Patient 05/05/14 1500     Chief Complaint  Patient presents with  . Sickle Cell Pain Crisis     (Consider location/radiation/quality/duration/timing/severity/associated sxs/prior Treatment) HPI  24 year old male with a history of sickle cell presents with recurrent sickle cell pain. The pain is in his low back as well as his bilateral legs. No weakness or numbness. No urinary symptoms. No fevers or chills or chest or abdominal pain. This is a recurrent issue associated with his sickle cell. He states he used to have oxycodone but ran out 2 weeks ago and has not tried to get a refill. Has been taking ibuprofen without relief. His symptoms started this morning. Rates his pain as a 10/10.  Past Medical History  Diagnosis Date  . Sickle cell anemia   . 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 Vitamin D deficiency y-6   Past Surgical History  Procedure Laterality Date  . Cholecystectomy     History reviewed. No pertinent family history. History  Substance Use Topics  . Smoking status: Never Smoker   . Smokeless tobacco: Never Used  . Alcohol Use: No    Review of Systems  Constitutional: Negative for fever and chills.  Respiratory: Negative for shortness of breath.   Cardiovascular: Negative for chest pain.  Gastrointestinal: Negative for nausea, vomiting and abdominal pain.  Genitourinary: Negative for dysuria.  Musculoskeletal: Positive for back pain and arthralgias.  All other systems reviewed and are negative.     Allergies  Morphine and related; Toradol; Tramadol; Vancomycin; and Zosyn  Home Medications   Prior to Admission medications   Medication Sig Start Date End Date Taking? Authorizing Provider  aspirin 81 MG chewable tablet Chew 1 tablet (81 mg total) by mouth daily. 04/09/14   Leana Gamer, MD  cefUROXime  (CEFTIN) 500 MG tablet Take 1 tablet (500 mg total) by mouth 2 (two) times daily with a meal. 04/09/14   Leana Gamer, MD  cholecalciferol (VITAMIN D) 1000 UNITS tablet Take 2 tablets (2,000 Units total) by mouth daily. 04/09/14   Leana Gamer, MD  diclofenac sodium (VOLTAREN) 1 % GEL Apply 2 g topically 4 (four) times daily. 04/09/14   Leana Gamer, MD  escitalopram (LEXAPRO) 10 MG tablet Take 1 tablet (10 mg total) by mouth daily. 04/09/14   Leana Gamer, MD  feeding supplement, ENSURE COMPLETE, (ENSURE COMPLETE) LIQD Take 237 mLs by mouth 3 (three) times daily between meals. 04/09/14   Leana Gamer, MD  folic acid (FOLVITE) 1 MG tablet Take 1 mg by mouth daily.    Historical Provider, MD  hydrOXYzine (ATARAX/VISTARIL) 50 MG tablet Take 1 tablet (50 mg total) by mouth at bedtime. 04/09/14   Leana Gamer, MD  ibuprofen (ADVIL,MOTRIN) 800 MG tablet Take 1 tablet (800 mg total) by mouth 3 (three) times daily. 02/12/14   Alexa Sherral Hammers, MD  methocarbamol (ROBAXIN) 500 MG tablet Take 1 tablet (500 mg total) by mouth 2 (two) times daily. Patient not taking: Reported on 03/29/2014 10/06/13   Fransico Meadow, PA-C  oxyCODONE-acetaminophen (PERCOCET) 10-325 MG per tablet Take 1 tablet by mouth every 4 (four) hours as needed for pain. 04/09/14   Leana Gamer, MD  polyethylene glycol [facility] / Floria Raveling) packet Take 17 g by mouth daily as needed for mild constipation.  04/09/14   Leana Gamer, MD  promethazine (PHENERGAN) 25 MG tablet Take 25 mg by mouth every 6 (six) hours as needed for nausea. For nausea 08/27/13   Historical Provider, MD  senna-docusate (SENOKOT-S) 8.6-50 MG per tablet Take 1 tablet by mouth 2 (two) times daily. 04/09/14   Leana Gamer, MD   BP 116/70 mmHg  Pulse 58  Temp(Src) 98.6 F (37 C)  Resp 18  SpO2 100% Physical Exam  Constitutional: He is oriented to person, place, and time. He appears well-developed and well-nourished.   Curled up on bed laying on right side. Refuses to move to participate in testing for back and legs, stating it hurts too bad to move  HENT:  Head: Normocephalic and atraumatic.  Right Ear: External ear normal.  Left Ear: External ear normal.  Nose: Nose normal.  Eyes: Right eye exhibits no discharge. Left eye exhibits no discharge.  Neck: Neck supple.  Cardiovascular: Normal rate, regular rhythm, normal heart sounds and intact distal pulses.   Pulmonary/Chest: Effort normal.  Abdominal: Soft. There is no tenderness.  Musculoskeletal: He exhibits no edema.       Thoracic back: He exhibits tenderness.       Lumbar back: He exhibits tenderness.  No focal tenderness or swelling to lower extremities  Neurological: He is alert and oriented to person, place, and time.  Skin: Skin is warm and dry.  Nursing note and vitals reviewed.   ED Course  Procedures (including critical care time) Labs Review Labs Reviewed  CBC WITH DIFFERENTIAL/PLATELET - Abnormal; Notable for the following:    RBC 4.13 (*)    Hemoglobin 11.6 (*)    HCT 34.3 (*)    RDW 22.8 (*)    Platelets 441 (*)    All other components within normal limits  COMPREHENSIVE METABOLIC PANEL - Abnormal; Notable for the following:    BUN <5 (*)    Total Bilirubin 2.4 (*)    All other components within normal limits  RETICULOCYTES - Abnormal; Notable for the following:    Retic Ct Pct 7.1 (*)    RBC. 4.13 (*)    Retic Count, Manual 293.2 (*)    All other components within normal limits    Imaging Review No results found.   EKG Interpretation None      MDM   Final diagnoses:  Sickle cell pain crisis    Pain improved with IV narcotics. Patient's vital signs remained stable, and he has Percocet at home. According to the narcotic database he received 90 oxycodone 6 days ago. Given his well appearance, benign lab work, and normal vitals, he is stable for discharge home to follow up with his hematologist as an  outpatient.    Sherwood Gambler, MD 05/05/14 [phone]

## 2014-05-05 NOTE — ED Notes (Signed)
Pt sts back pain and leg pain that started this am.

## 2014-05-05 NOTE — ED Notes (Signed)
Pt currently not in the room, will collect labs when pt returns.

## 2014-05-06 ENCOUNTER — Inpatient Hospital Stay (HOSPITAL_COMMUNITY): Payer: Medicaid Other

## 2014-05-06 ENCOUNTER — Encounter (HOSPITAL_COMMUNITY): Payer: Self-pay

## 2014-05-06 DIAGNOSIS — E43 Unspecified severe protein-calorie malnutrition: Secondary | ICD-10-CM | POA: Diagnosis not present

## 2014-05-06 DIAGNOSIS — Z79899 Other long term (current) drug therapy: Secondary | ICD-10-CM | POA: Diagnosis not present

## 2014-05-06 DIAGNOSIS — Z9049 Acquired absence of other specified parts of digestive tract: Secondary | ICD-10-CM | POA: Diagnosis present

## 2014-05-06 DIAGNOSIS — E876 Hypokalemia: Secondary | ICD-10-CM | POA: Diagnosis present

## 2014-05-06 DIAGNOSIS — D5700 Hb-SS disease with crisis, unspecified: Secondary | ICD-10-CM | POA: Diagnosis present

## 2014-05-06 DIAGNOSIS — R509 Fever, unspecified: Secondary | ICD-10-CM | POA: Diagnosis not present

## 2014-05-06 DIAGNOSIS — M25562 Pain in left knee: Secondary | ICD-10-CM | POA: Diagnosis not present

## 2014-05-06 DIAGNOSIS — N4830 Priapism, unspecified: Secondary | ICD-10-CM | POA: Diagnosis not present

## 2014-05-06 DIAGNOSIS — M879 Osteonecrosis, unspecified: Secondary | ICD-10-CM | POA: Diagnosis present

## 2014-05-06 DIAGNOSIS — Z681 Body mass index (BMI) 19 or less, adult: Secondary | ICD-10-CM | POA: Diagnosis not present

## 2014-05-06 DIAGNOSIS — Z7982 Long term (current) use of aspirin: Secondary | ICD-10-CM | POA: Diagnosis not present

## 2014-05-06 LAB — CBC WITH DIFFERENTIAL/PLATELET
Basophils Absolute: 0 10*3/uL (ref 0.0–0.1)
Basophils Absolute: 0 10*3/uL (ref 0.0–0.1)
Basophils Relative: 0 % (ref 0–1)
Basophils Relative: 0 % (ref 0–1)
Eosinophils Absolute: 0 10*3/uL (ref 0.0–0.7)
Eosinophils Absolute: 0 10*3/uL (ref 0.0–0.7)
Eosinophils Relative: 0 % (ref 0–5)
Eosinophils Relative: 0 % (ref 0–5)
HCT: 34.1 % — ABNORMAL LOW (ref 39.0–52.0)
HCT: 31.2 % — ABNORMAL LOW (ref 39.0–52.0)
Hemoglobin: 11.4 g/dL — ABNORMAL LOW (ref 13.0–17.0)
Hemoglobin: 10.5 g/dL — ABNORMAL LOW (ref 13.0–17.0)
Lymphocytes Relative: 15 % (ref 12–46)
Lymphocytes Relative: 12 % (ref 12–46)
Lymphs Abs: 3 10*3/uL (ref 0.7–4.0)
Lymphs Abs: 2.4 10*3/uL (ref 0.7–4.0)
MCH: 27.9 pg (ref 26.0–34.0)
MCH: 28.1 pg (ref 26.0–34.0)
MCHC: 33.4 g/dL (ref 30.0–36.0)
MCHC: 33.7 g/dL (ref 30.0–36.0)
MCV: 83.6 fL (ref 78.0–100.0)
MCV: 83.4 fL (ref 78.0–100.0)
Monocytes Absolute: 1.6 10*3/uL — ABNORMAL HIGH (ref 0.1–1.0)
Monocytes Absolute: 2 10*3/uL — ABNORMAL HIGH (ref 0.1–1.0)
Monocytes Relative: 8 % (ref 3–12)
Monocytes Relative: 10 % (ref 3–12)
Neutro Abs: 15.2 10*3/uL — ABNORMAL HIGH (ref 1.7–7.7)
Neutro Abs: 16 10*3/uL — ABNORMAL HIGH (ref 1.7–7.7)
Neutrophils Relative %: 77 % (ref 43–77)
Neutrophils Relative %: 78 % — ABNORMAL HIGH (ref 43–77)
Platelets: 404 10*3/uL — ABNORMAL HIGH (ref 150–400)
Platelets: 315 10*3/uL (ref 150–400)
RBC: 4.08 MIL/uL — ABNORMAL LOW (ref 4.22–5.81)
RBC: 3.74 MIL/uL — ABNORMAL LOW (ref 4.22–5.81)
RDW: 22.4 % — ABNORMAL HIGH (ref 11.5–15.5)
RDW: 22.3 % — ABNORMAL HIGH (ref 11.5–15.5)
WBC: 19.8 10*3/uL — ABNORMAL HIGH (ref 4.0–10.5)
WBC: 20.4 10*3/uL — ABNORMAL HIGH (ref 4.0–10.5)

## 2014-05-06 LAB — CBC
HCT: 31 % — ABNORMAL LOW (ref 39.0–52.0)
Hemoglobin: 10.4 g/dL — ABNORMAL LOW (ref 13.0–17.0)
MCH: 27.9 pg (ref 26.0–34.0)
MCHC: 33.5 g/dL (ref 30.0–36.0)
MCV: 83.1 fL (ref 78.0–100.0)
Platelets: 362 10*3/uL (ref 150–400)
RBC: 3.73 MIL/uL — ABNORMAL LOW (ref 4.22–5.81)
RDW: 22.6 % — ABNORMAL HIGH (ref 11.5–15.5)
WBC: 13.4 10*3/uL — ABNORMAL HIGH (ref 4.0–10.5)

## 2014-05-06 LAB — RETICULOCYTES
RBC.: 4.08 MIL/uL — ABNORMAL LOW (ref 4.22–5.81)
Retic Count, Absolute: 293.8 10*3/uL — ABNORMAL HIGH (ref 19.0–186.0)
Retic Ct Pct: 7.2 % — ABNORMAL HIGH (ref 0.4–3.1)

## 2014-05-06 LAB — COMPREHENSIVE METABOLIC PANEL
ALT: 12 U/L (ref 0–53)
AST: 27 U/L (ref 0–37)
Albumin: 4.3 g/dL (ref 3.5–5.2)
Alkaline Phosphatase: 99 U/L (ref 39–117)
Anion gap: 7 (ref 5–15)
BUN: 7 mg/dL (ref 6–23)
CO2: 25 mmol/L (ref 19–32)
Calcium: 8.8 mg/dL (ref 8.4–10.5)
Chloride: 104 mmol/L (ref 96–112)
Creatinine, Ser: 0.46 mg/dL — ABNORMAL LOW (ref 0.50–1.35)
GFR calc Af Amer: >90 mL/min (ref >90)
GFR calc non Af Amer: >90 mL/min (ref >90)
Glucose, Bld: 97 mg/dL (ref 70–99)
Potassium: 3.3 mmol/L — ABNORMAL LOW (ref 3.5–5.1)
Sodium: 136 mmol/L (ref 135–145)
Total Bilirubin: 2.8 mg/dL — ABNORMAL HIGH (ref 0.3–1.2)
Total Protein: 7.9 g/dL (ref 6.0–8.3)

## 2014-05-06 LAB — LACTIC ACID, PLASMA: Lactic Acid, Venous: 0.7 mmol/L (ref 0.5–2.0)

## 2014-05-06 LAB — LACTATE DEHYDROGENASE: LDH: 295 U/L — ABNORMAL HIGH (ref 94–250)

## 2014-05-06 LAB — MAGNESIUM: Magnesium: 1.7 mg/dL (ref 1.5–2.5)

## 2014-05-06 MED ORDER — ENOXAPARIN SODIUM 40 MG/0.4ML ~~LOC~~ SOLN
40.0000 mg | SUBCUTANEOUS | Status: DC
  Administered 2014-05-06 – 2014-05-08 (×3): 40 mg via SUBCUTANEOUS
  Filled 2014-05-06 (×6): qty 0.4

## 2014-05-06 MED ORDER — SODIUM CHLORIDE 0.9 % IJ SOLN: 9.0000 mL | INTRAMUSCULAR | Status: DC | PRN

## 2014-05-06 MED ORDER — ONDANSETRON HCL 4 MG/2ML IJ SOLN
4.0000 mg | Freq: Four times a day (QID) | INTRAMUSCULAR | Status: DC | PRN
  Administered 2014-05-08 – 2014-05-09 (×2): 4 mg via INTRAVENOUS
  Filled 2014-05-06: qty 2

## 2014-05-06 MED ORDER — SODIUM CHLORIDE 0.45 % IV SOLN
INTRAVENOUS | Status: DC
  Administered 2014-05-06 – 2014-05-10 (×5): via INTRAVENOUS

## 2014-05-06 MED ORDER — ONDANSETRON HCL 4 MG/2ML IJ SOLN
4.0000 mg | Freq: Once | INTRAMUSCULAR | Status: AC
  Administered 2014-05-06: 4 mg via INTRAVENOUS

## 2014-05-06 MED ORDER — NALOXONE HCL 0.4 MG/ML IJ SOLN: 0.4000 mg | INTRAMUSCULAR | Status: DC | PRN

## 2014-05-06 MED ORDER — SODIUM CHLORIDE 0.9 % IV BOLUS (SEPSIS)
1000.0000 mL | Freq: Once | INTRAVENOUS | Status: AC
  Administered 2014-05-06: 1000 mL via INTRAVENOUS

## 2014-05-06 MED ORDER — SENNOSIDES-DOCUSATE SODIUM 8.6-50 MG PO TABS
1.0000 | ORAL_TABLET | Freq: Two times a day (BID) | ORAL | Status: DC
  Administered 2014-05-06 – 2014-05-10 (×6): 1 via ORAL
  Filled 2014-05-06 (×13): qty 1

## 2014-05-06 MED ORDER — LEVOFLOXACIN IN D5W 750 MG/150ML IV SOLN
750.0000 mg | Freq: Every day | INTRAVENOUS | Status: DC
  Filled 2014-05-06: qty 150

## 2014-05-06 MED ORDER — ONDANSETRON HCL 4 MG/2ML IJ SOLN
4.0000 mg | INTRAMUSCULAR | Status: DC | PRN
  Filled 2014-05-06: qty 2

## 2014-05-06 MED ORDER — POLYETHYLENE GLYCOL 3350 17 G PO PACK: 17.0000 g | PACK | Freq: Every day | ORAL | Status: DC | PRN

## 2014-05-06 MED ORDER — HYDROMORPHONE HCL 2 MG/ML IJ SOLN
2.0000 mg | Freq: Once | INTRAMUSCULAR | Status: AC
  Administered 2014-05-06: 2 mg via INTRAVENOUS
  Filled 2014-05-06: qty 1

## 2014-05-06 MED ORDER — CEFTRIAXONE SODIUM IN DEXTROSE 20 MG/ML IV SOLN
1.0000 g | Freq: Every day | INTRAVENOUS | Status: DC
  Administered 2014-05-06 – 2014-05-08 (×3): 1 g via INTRAVENOUS
  Filled 2014-05-06 (×3): qty 50

## 2014-05-06 MED ORDER — FOLIC ACID 1 MG PO TABS
1.0000 mg | ORAL_TABLET | Freq: Every day | ORAL | Status: DC
  Administered 2014-05-06 – 2014-05-11 (×6): 1 mg via ORAL
  Filled 2014-05-06 (×6): qty 1

## 2014-05-06 MED ORDER — KETOROLAC TROMETHAMINE 30 MG/ML IJ SOLN
30.0000 mg | Freq: Once | INTRAMUSCULAR | Status: AC
  Administered 2014-05-06: 30 mg via INTRAVENOUS
  Filled 2014-05-06: qty 1

## 2014-05-06 MED ORDER — HYDROMORPHONE 2 MG/ML HIGH CONCENTRATION IV PCA SOLN
INTRAVENOUS | Status: DC
  Administered 2014-05-06 (×2): 8 mg via INTRAVENOUS
  Administered 2014-05-06 (×2): 4.2 mg via INTRAVENOUS
  Administered 2014-05-06: 06:00:00 via INTRAVENOUS
  Administered 2014-05-06: 3.6 mg via INTRAVENOUS
  Administered 2014-05-07: 2.4 mg via INTRAVENOUS
  Administered 2014-05-07: 4.2 mg via INTRAVENOUS
  Administered 2014-05-07: 7.6 mg via INTRAVENOUS
  Administered 2014-05-07: 12.4 mg via INTRAVENOUS
  Administered 2014-05-07: 19:00:00 via INTRAVENOUS
  Administered 2014-05-07: 3.6 mg via INTRAVENOUS
  Administered 2014-05-08: 2.4 mg via INTRAVENOUS
  Administered 2014-05-08: 5 mg via INTRAVENOUS
  Administered 2014-05-08: 7 mg via INTRAVENOUS
  Filled 2014-05-06 (×2): qty 25

## 2014-05-06 MED ORDER — DEXTROSE 5 % IV SOLN
500.0000 mg | Freq: Every day | INTRAVENOUS | Status: DC
  Administered 2014-05-07: 500 mg via INTRAVENOUS
  Filled 2014-05-06 (×2): qty 500

## 2014-05-06 MED ORDER — ONDANSETRON HCL 4 MG PO TABS: 4.0000 mg | ORAL_TABLET | ORAL | Status: DC | PRN

## 2014-05-06 MED ORDER — DIPHENHYDRAMINE HCL 50 MG/ML IJ SOLN
25.0000 mg | Freq: Once | INTRAMUSCULAR | Status: AC
  Administered 2014-05-06: 25 mg via INTRAVENOUS
  Filled 2014-05-06: qty 1

## 2014-05-06 MED ORDER — HYDROXYUREA 500 MG PO CAPS
500.0000 mg | ORAL_CAPSULE | Freq: Every day | ORAL | Status: DC
  Administered 2014-05-06 – 2014-05-11 (×6): 500 mg via ORAL
  Filled 2014-05-06 (×6): qty 1

## 2014-05-06 MED ORDER — HYDROMORPHONE HCL 1 MG/ML IJ SOLN
1.0000 mg | Freq: Once | INTRAMUSCULAR | Status: AC
Start: 2014-05-06 — End: 2014-05-06
  Administered 2014-05-06: 1 mg via INTRAVENOUS
  Filled 2014-05-06: qty 1

## 2014-05-06 MED ORDER — ACETAMINOPHEN 325 MG PO TABS
650.0000 mg | ORAL_TABLET | Freq: Four times a day (QID) | ORAL | Status: DC | PRN
  Administered 2014-05-06: 650 mg via ORAL
  Filled 2014-05-06: qty 2

## 2014-05-06 MED ORDER — HYDROMORPHONE HCL 1 MG/ML IJ SOLN
1.0000 mg | Freq: Once | INTRAMUSCULAR | Status: AC
  Administered 2014-05-06: 1 mg via INTRAVENOUS
  Filled 2014-05-06: qty 1

## 2014-05-06 NOTE — Progress Notes (Signed)
Pt temp 101.2. Baltazar Najjar NP on call notified. Orders received. Tylenol administered, flu PCR sent. Pt placed on droplet precautions.

## 2014-05-06 NOTE — ED Provider Notes (Signed)
CSN: [id number]     Arrival date & time 05/05/14  2250 History   First MD Initiated Contact with Patient 05/06/14 0015     Chief Complaint  Patient presents with  . Sickle Cell Pain Crisis     (Consider location/radiation/quality/duration/timing/severity/associated sxs/prior Treatment) HPI 24 year old male with history of sickle cell disease presents to the emergency department with complaint of back leg, arm and chest pain.  Patient seen earlier in the emergency department today with sickle cell pain, had good control with IV pain medication, but reports since being home.  Pain has returned.  Patient reports she took his oxycodone at a p.m. without improvement.  No fevers or chills.  No shortness of breath, no chest pain. Past Medical History  Diagnosis Date  . Sickle cell anemia   . 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 Vitamin D deficiency y-6   Past Surgical History  Procedure Laterality Date  . Cholecystectomy     History reviewed. No pertinent family history. History  Substance Use Topics  . Smoking status: Never Smoker   . Smokeless tobacco: Never Used  . Alcohol Use: No    Review of Systems   See History of Present Illness; otherwise all other systems are reviewed and negative  Allergies  Morphine and related; Toradol; Tramadol; Vancomycin; and Zosyn  Home Medications   Prior to Admission medications   Medication Sig Start Date End Date Taking? Authorizing Provider  folic acid (FOLVITE) 1 MG tablet Take 1 mg by mouth daily.   Yes Historical Provider, MD  hydroxyurea (HYDREA) 500 MG capsule Take 500 mg by mouth daily. May take with food to minimize GI side effects.   Yes Historical Provider, MD  oxyCODONE-acetaminophen (PERCOCET) 10-325 MG per tablet Take 1 tablet by mouth every 4 (four) hours as needed for pain. 04/09/14  Yes Leana Gamer, MD  aspirin 81 MG chewable tablet Chew 1 tablet (81 mg total) by  mouth daily. Patient not taking: Reported on 05/05/2014 04/09/14   Leana Gamer, MD  cefUROXime (CEFTIN) 500 MG tablet Take 1 tablet (500 mg total) by mouth 2 (two) times daily with a meal. Patient not taking: Reported on 05/05/2014 04/09/14   Leana Gamer, MD  cholecalciferol (VITAMIN D) 1000 UNITS tablet Take 2 tablets (2,000 Units total) by mouth daily. Patient not taking: Reported on 05/05/2014 04/09/14   Leana Gamer, MD  diclofenac sodium (VOLTAREN) 1 % GEL Apply 2 g topically 4 (four) times daily. Patient not taking: Reported on 05/05/2014 04/09/14   Leana Gamer, MD  escitalopram (LEXAPRO) 10 MG tablet Take 1 tablet (10 mg total) by mouth daily. Patient not taking: Reported on 05/05/2014 04/09/14   Leana Gamer, MD  feeding supplement, ENSURE COMPLETE, (ENSURE COMPLETE) LIQD Take 237 mLs by mouth 3 (three) times daily between meals. Patient not taking: Reported on 05/05/2014 04/09/14   Leana Gamer, MD  hydrOXYzine (ATARAX/VISTARIL) 50 MG tablet Take 1 tablet (50 mg total) by mouth at bedtime. Patient not taking: Reported on 05/05/2014 04/09/14   Leana Gamer, MD  ibuprofen (ADVIL,MOTRIN) 800 MG tablet Take 1 tablet (800 mg total) by mouth 3 (three) times daily. Patient not taking: Reported on 05/05/2014 02/12/14   Alexa Sherral Hammers, MD  methocarbamol (ROBAXIN) 500 MG tablet Take 1 tablet (500 mg total) by mouth 2 (two) times daily. Patient not taking: Reported on 03/29/2014 10/06/13   Fransico Meadow,  PA-C  polyethylene glycol (MIRALAX / GLYCOLAX) packet Take 17 g by mouth daily as needed for mild constipation. Patient not taking: Reported on 05/05/2014 04/09/14   Leana Gamer, MD  senna-docusate (SENOKOT-S) 8.6-50 MG per tablet Take 1 tablet by mouth 2 (two) times daily. Patient not taking: Reported on 05/05/2014 04/09/14   Leana Gamer, MD   BP 131/64 mmHg  Pulse 67  Temp(Src) 99.2 F (37.3 C) (Oral)  Resp 13  Ht 6\' 2"  (1.88 m)  Wt 135  lb (61.236 kg)  BMI 17.33 kg/m2  SpO2 97% Physical Exam  Constitutional: He is oriented to person, place, and time. He appears well-developed and well-nourished. He appears distressed (Patient appears to be in pain).  HENT:  Head: Normocephalic and atraumatic.  Nose: Nose normal.  Mouth/Throat: Oropharynx is clear and moist.  Eyes: Conjunctivae and EOM are normal. Pupils are equal, round, and reactive to light.  Neck: Normal range of motion. Neck supple. No JVD present. No tracheal deviation present. No thyromegaly present.  Cardiovascular: Normal rate, regular rhythm, normal heart sounds and intact distal pulses.  Exam reveals no gallop and no friction rub.   No murmur heard. Pulmonary/Chest: Effort normal and breath sounds normal. No stridor. No respiratory distress. He has no wheezes. He has no rales. He exhibits no tenderness.  Abdominal: Soft. Bowel sounds are normal. He exhibits no distension and no mass. There is no tenderness. There is no rebound and no guarding.  Musculoskeletal: Normal range of motion. He exhibits tenderness (pain with palpation throughout arms and legs). He exhibits no edema.  Lymphadenopathy:    He has no cervical adenopathy.  Neurological: He is alert and oriented to person, place, and time. He displays normal reflexes. He exhibits normal muscle tone. Coordination normal.  Skin: Skin is warm and dry. No rash noted. No erythema. No pallor.  Psychiatric: He has a normal mood and affect. His behavior is normal. Judgment and thought content normal.  Nursing note and vitals reviewed.   ED Course  Procedures (including critical care time) Labs Review Labs Reviewed  CBC WITH DIFFERENTIAL/PLATELET - Abnormal; Notable for the following:    WBC 19.8 (*)    RBC 4.08 (*)    Hemoglobin 11.4 (*)    HCT 34.1 (*)    RDW 22.4 (*)    Platelets 404 (*)    Neutro Abs 15.2 (*)    Monocytes Absolute 1.6 (*)    All other components within normal limits  COMPREHENSIVE  METABOLIC PANEL - Abnormal; Notable for the following:    Potassium 3.3 (*)    Creatinine, Ser 0.46 (*)    Total Bilirubin 2.8 (*)    All other components within normal limits  RETICULOCYTES - Abnormal; Notable for the following:    Retic Ct Pct 7.2 (*)    RBC. 4.08 (*)    Retic Count, Manual 293.8 (*)    All other components within normal limits    Imaging Review Dg Chest 2 View  05/06/2014   CLINICAL DATA:  Acute onset of severe upper back pain, fever and weakness. Initial encounter.  EXAM: CHEST  2 VIEW  COMPARISON:  Chest radiograph performed 03/30/2014, and CTA of the chest performed 04/01/2014  FINDINGS: The lungs are well-aerated and clear. There is no evidence of focal opacification, pleural effusion or pneumothorax.  The heart is normal in size; the mediastinal contour is within normal limits. No acute osseous abnormalities are seen.  IMPRESSION: No acute cardiopulmonary process seen.  Electronically Signed   By: Garald Balding M.D.   On: 05/06/2014 00:22     EKG Interpretation   Date/Time:  Monday May 05 2014 23:00:59 EDT Ventricular Rate:  69 PR Interval:  142 QRS Duration: 108 QT Interval:  362 QTC Calculation: 388 R Axis:   73 Text Interpretation:  Sinus rhythm RSR' in V1 or V2, probably normal  variant No significant change since last tracing Confirmed by Benjamyn Hestand  MD,  Burlene Montecalvo ([id number]) on 05/05/2014 11:18:14 PM      MDM   Final diagnoses:  Sickle cell crisis   24 year old male with persistent sickle cell pain.  A shunt complaining of chest pain at this time.  EKG and chest x-ray unremarkable.  White blood cell count is elevated from visit earlier today.  He does not have any signs of acute chest process.  His fever, shortness of breath or dyspnea.  Patient has received 3 rounds of Dilaudid with only minimal improvement.  We'll discuss with hospitalist for admission  Linton Flemings, MD 05/06/14 [phone]

## 2014-05-06 NOTE — Plan of Care (Addendum)
RN paged secondary to pt having fever of 101.65f. Stat CBC with diff, flu PCR, UA and urine culture, blood cultures x 2, and lactate ordered. CXR with ? PNA. Started Levaquin. Droplet precs until flu results.  SaO2 normal. No respiratory distress. Will follow. Timothy Boll, NP [facility] Pt states Levaquin causes itching. Allergic to Zosyn but can take rocephin. Will start rocephin and Azithromycin.

## 2014-05-06 NOTE — Care Management Note (Signed)
CARE MANAGEMENT NOTE 05/06/2014  Patient:  Timothy Marks, Timothy Marks   Account Number:  [id number]  Date Initiated:  05/06/2014  Documentation initiated by:  Marney Doctor  Subjective/Objective Assessment:   24 yo admitted with [facility]     Action/Plan:   From home with friends   Anticipated DC Date:  05/09/2014   Anticipated DC Plan:  [place]  CM consult      Choice offered to / List presented to:             Status of service:  In process, will continue to follow Medicare Important Message given?   (If response is "NO", the following Medicare IM given date fields will be blank) Date Medicare IM given:   Medicare IM given by:   Date Additional Medicare IM given:   Additional Medicare IM given by:    Discharge Disposition:    Per UR Regulation:  Reviewed for med. necessity/level of care/duration of stay  If discussed at [place] of Stay Meetings, dates discussed:    Comments:  05/06/14 Marney Doctor RN,BSN,NCM [phone] Chart reviewed. Pt with PCP. CM following for DC needs.

## 2014-05-06 NOTE — H&P (Signed)
[facility] History and Physical  Patient: Timothy Marks  MRN: [id number]  DOB: 1990/09/10  DOS: the patient was seen and examined on 05/06/2014 PCP: [place]  Chief Complaint: Back pain and leg pain and chest pain  HPI: Timothy Marks is a 24 y.o. male with Past medical history of sickle cell disease. The patient is presenting with complaints of back pain leg pain and chest pain. This has started since last 2 days. He mentions he was stable earlier and he actually ran out of his oxycodone 2 weeks ago and has not tried to refill it at present. He has been taking ibuprofen which was not helping him. He denies any fever chills cough, diarrhea nausea vomiting, burning urination. 2 days ago he had an episode of priapism that lasted for 30 minutes. He mentions that he has been having this kind of episodes more frequently. He does not have any priapism at present.  The patient is coming from home. And at his baseline independent for most of his ADL.  Review of Systems: as mentioned in the history of present illness.  A Comprehensive review of the other systems is negative.  Past Medical History  Diagnosis Date  . Sickle cell anemia   . 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 Vitamin D deficiency y-6   Past Surgical History  Procedure Laterality Date  . Cholecystectomy     Social History:  reports that he has never smoked. He has never used smokeless tobacco. He reports that he does not drink alcohol or use illicit drugs.  Allergies  Allergen Reactions  . Morphine And Related Hives  . Toradol [Ketorolac Tromethamine] Itching  . Tramadol Hives  . Vancomycin Itching  . Zosyn [Piperacillin Sod-Tazobactam So] Itching and Rash    Has taken rocephin in past    History reviewed. No pertinent family history.  Prior to Admission medications   Medication Sig Start Date End Date Taking?  Authorizing Provider  folic acid (FOLVITE) 1 MG tablet Take 1 mg by mouth daily.   Yes Historical Provider, MD  hydroxyurea (HYDREA) 500 MG capsule Take 500 mg by mouth daily. May take with food to minimize GI side effects.   Yes Historical Provider, MD  oxyCODONE-acetaminophen (PERCOCET) 10-325 MG per tablet Take 1 tablet by mouth every 4 (four) hours as needed for pain. 04/09/14  Yes Leana Gamer, MD  aspirin 81 MG chewable tablet Chew 1 tablet (81 mg total) by mouth daily. Patient not taking: Reported on 05/05/2014 04/09/14   Leana Gamer, MD  cefUROXime (CEFTIN) 500 MG tablet Take 1 tablet (500 mg total) by mouth 2 (two) times daily with a meal. Patient not taking: Reported on 05/05/2014 04/09/14   Leana Gamer, MD  cholecalciferol (VITAMIN D) 1000 UNITS tablet Take 2 tablets (2,000 Units total) by mouth daily. Patient not taking: Reported on 05/05/2014 04/09/14   Leana Gamer, MD  diclofenac sodium (VOLTAREN) 1 % GEL Apply 2 g topically 4 (four) times daily. Patient not taking: Reported on 05/05/2014 04/09/14   Leana Gamer, MD  escitalopram (LEXAPRO) 10 MG tablet Take 1 tablet (10 mg total) by mouth daily. Patient not taking: Reported on 05/05/2014 04/09/14   Leana Gamer, MD  feeding supplement, ENSURE COMPLETE, (ENSURE COMPLETE) LIQD Take 237 mLs by mouth 3 (three) times daily between meals. Patient not taking: Reported on 05/05/2014 04/09/14   Leana Gamer,  MD  hydrOXYzine (ATARAX/VISTARIL) 50 MG tablet Take 1 tablet (50 mg total) by mouth at bedtime. Patient not taking: Reported on 05/05/2014 04/09/14   Leana Gamer, MD  ibuprofen (ADVIL,MOTRIN) 800 MG tablet Take 1 tablet (800 mg total) by mouth 3 (three) times daily. Patient not taking: Reported on 05/05/2014 02/12/14   Alexa Sherral Hammers, MD  methocarbamol (ROBAXIN) 500 MG tablet Take 1 tablet (500 mg total) by mouth 2 (two) times daily. Patient not taking: Reported on 03/29/2014 10/06/13    Fransico Meadow, PA-C  polyethylene glycol [facility] / Floria Raveling) packet Take 17 g by mouth daily as needed for mild constipation. Patient not taking: Reported on 05/05/2014 04/09/14   Leana Gamer, MD  senna-docusate (SENOKOT-S) 8.6-50 MG per tablet Take 1 tablet by mouth 2 (two) times daily. Patient not taking: Reported on 05/05/2014 04/09/14   Leana Gamer, MD    Physical Exam: Filed Vitals:   05/06/14 0430 05/06/14 0446 05/06/14 0500 05/06/14 0527  BP: 130/63 130/63 132/64 139/65  Pulse: 74 80 71 87  Temp:    98.6 F (37 C)  TempSrc:    Oral  Resp: 14 22 13 16   Height:    6\' 2"  (1.88 m)  Weight:    56.9 kg (125 lb 7.1 oz)  SpO2: 93% 98% 96% 100%    General: Alert, Awake and Oriented to Time, Place and Person. Appear in mild distress Eyes: PERRL ENT: Oral Mucosa clear moist. Neck: no JVD Cardiovascular: S1 and S2 Present, no Murmur, Peripheral Pulses Present Respiratory: Bilateral Air entry equal and Decreased, Clear to Auscultation, noCrackles, no wheezes Abdomen: Bowel Sound present, Soft and non tender Skin: no Rash Extremities: no Pedal edema, no calf tenderness Neurologic: Grossly no focal neuro deficit.  Labs on Admission:  CBC:  Recent Labs Lab 05/05/14 1355 05/06/14 0017  WBC 10.5 19.8*  NEUTROABS 7.1 15.2*  HGB 11.6* 11.4*  HCT 34.3* 34.1*  MCV 83.1 83.6  PLT 441* 404*    CMP     Component Value Date/Time   NA 136 05/06/2014 0017   K 3.3* 05/06/2014 0017   CL 104 05/06/2014 0017   CO2 25 05/06/2014 0017   GLUCOSE 97 05/06/2014 0017   BUN 7 05/06/2014 0017   CREATININE 0.46* 05/06/2014 0017   CALCIUM 8.8 05/06/2014 0017   PROT 7.9 05/06/2014 0017   ALBUMIN 4.3 05/06/2014 0017   AST 27 05/06/2014 0017   ALT 12 05/06/2014 0017   ALKPHOS 99 05/06/2014 0017   BILITOT 2.8* 05/06/2014 0017   GFRNONAA >90 05/06/2014 0017   GFRAA >90 05/06/2014 0017    No results for input(s): LIPASE, AMYLASE in the last 168 hours.  No results for  input(s): CKTOTAL, CKMB, CKMBINDEX, TROPONINI in the last 168 hours. BNP (last 3 results) No results for input(s): BNP in the last 8760 hours.  ProBNP (last 3 results) No results for input(s): PROBNP in the last 8760 hours.   Radiological Exams on Admission: Dg Chest 2 View  05/06/2014   CLINICAL DATA:  Acute onset of severe upper back pain, fever and weakness. Initial encounter.  EXAM: CHEST  2 VIEW  COMPARISON:  Chest radiograph performed 03/30/2014, and CTA of the chest performed 04/01/2014  FINDINGS: The lungs are well-aerated and clear. There is no evidence of focal opacification, pleural effusion or pneumothorax.  The heart is normal in size; the mediastinal contour is within normal limits. No acute osseous abnormalities are seen.  IMPRESSION: No acute cardiopulmonary process  seen.   Electronically Signed   By: Garald Balding M.D.   On: 05/06/2014 00:22    EKG: Independently reviewed. normal sinus rhythm, nonspecific ST and T waves changes.  Assessment/Plan Principal Problem:   Sickle cell anemia with crisis Active Problems:   Sickle cell crisis   Protein-calorie malnutrition, severe   h/o Priapism   1. Sickle cell anemia with crisis The patient is presenting with complaints of leg pain and back pain. The patient has not been improving with his home medication and therefore is being admitted in the hospital. He also does of some chest pain but chest x-ray is clear he does not have any fever or chills or cough and EKG does not show any evidence of ischemia. He mentions this pain is similar to his sickle cell pain. With this the patient will be placed on Dilaudid PCA which has been used it during his last admission. He would also give half normal saline. Continue Hydrea and folic acid levels. Patient is also appeared to have possible stuttering priapism. Patient will be seen by a sickle cell service in the morning and may consider further treatment regarding the same.  DVT  Prophylaxis: subcutaneous Heparin Nutrition: Regular diet  Disposition: Admitted to inpatient in med-surge unit.  Author: Berle Mull, MD [facility] Pager: [phone] 05/06/2014, 5:35 AM    If 7PM-7AM, please contact night-coverage [email or web address] Password [email or web address]

## 2014-05-07 ENCOUNTER — Inpatient Hospital Stay (HOSPITAL_COMMUNITY): Payer: Medicaid Other

## 2014-05-07 DIAGNOSIS — E876 Hypokalemia: Secondary | ICD-10-CM

## 2014-05-07 DIAGNOSIS — R509 Fever, unspecified: Secondary | ICD-10-CM

## 2014-05-07 DIAGNOSIS — R269 Unspecified abnormalities of gait and mobility: Secondary | ICD-10-CM

## 2014-05-07 DIAGNOSIS — M25462 Effusion, left knee: Secondary | ICD-10-CM

## 2014-05-07 DIAGNOSIS — M25562 Pain in left knee: Secondary | ICD-10-CM

## 2014-05-07 LAB — URINALYSIS, ROUTINE W REFLEX MICROSCOPIC
Bilirubin Urine: NEGATIVE
Glucose, UA: NEGATIVE mg/dL
Hgb urine dipstick: NEGATIVE
Ketones, ur: >80 mg/dL — AB
Leukocytes, UA: NEGATIVE
Nitrite: NEGATIVE
Protein, ur: NEGATIVE mg/dL
Specific Gravity, Urine: 1.009 (ref 1.005–1.030)
Urobilinogen, UA: 1 mg/dL (ref 0.0–1.0)
pH: 6 (ref 5.0–8.0)

## 2014-05-07 LAB — INFLUENZA PANEL BY PCR (TYPE A & B)
H1N1 flu by pcr: NOT DETECTED
Influenza A By PCR: NEGATIVE
Influenza B By PCR: NEGATIVE

## 2014-05-07 LAB — BASIC METABOLIC PANEL
Anion gap: 9 (ref 5–15)
BUN: 6 mg/dL (ref 6–23)
CO2: 27 mmol/L (ref 19–32)
Calcium: 8.4 mg/dL (ref 8.4–10.5)
Chloride: 97 mmol/L (ref 96–112)
Creatinine, Ser: 0.52 mg/dL (ref 0.50–1.35)
GFR calc Af Amer: >90 mL/min (ref >90)
GFR calc non Af Amer: >90 mL/min (ref >90)
Glucose, Bld: 112 mg/dL — ABNORMAL HIGH (ref 70–99)
Potassium: 3.3 mmol/L — ABNORMAL LOW (ref 3.5–5.1)
Sodium: 133 mmol/L — ABNORMAL LOW (ref 135–145)

## 2014-05-07 LAB — MAGNESIUM: Magnesium: 1.6 mg/dL (ref 1.5–2.5)

## 2014-05-07 MED ORDER — HYDROMORPHONE HCL 2 MG/ML IJ SOLN
2.0000 mg | INTRAMUSCULAR | Status: DC
  Administered 2014-05-07 – 2014-05-09 (×12): 2 mg via INTRAVENOUS
  Filled 2014-05-07 (×7): qty 1

## 2014-05-07 MED ORDER — DIPHENHYDRAMINE HCL 50 MG/ML IJ SOLN
25.0000 mg | INTRAMUSCULAR | Status: DC | PRN
Start: 2014-05-07 — End: 2014-05-11
  Filled 2014-05-07: qty 0.5

## 2014-05-07 MED ORDER — PANTOPRAZOLE SODIUM 40 MG PO TBEC
40.0000 mg | DELAYED_RELEASE_TABLET | Freq: Every day | ORAL | Status: DC
  Administered 2014-05-07 – 2014-05-11 (×5): 40 mg via ORAL
  Filled 2014-05-07 (×5): qty 1

## 2014-05-07 MED ORDER — MAGNESIUM SULFATE 2 GM/50ML IV SOLN
2.0000 g | Freq: Once | INTRAVENOUS | Status: AC
Start: 2014-05-07 — End: 2014-05-07
  Administered 2014-05-07: 2 g via INTRAVENOUS
  Filled 2014-05-07: qty 50

## 2014-05-07 MED ORDER — IBUPROFEN 800 MG PO TABS
800.0000 mg | ORAL_TABLET | Freq: Three times a day (TID) | ORAL | Status: AC
Start: 2014-05-07 — End: 2014-05-10
  Administered 2014-05-07 – 2014-05-10 (×8): 800 mg via ORAL
  Filled 2014-05-07 (×10): qty 1

## 2014-05-07 MED ORDER — DIPHENHYDRAMINE HCL 25 MG PO CAPS: 25.0000 mg | ORAL_CAPSULE | Freq: Four times a day (QID) | ORAL | Status: DC | PRN

## 2014-05-07 MED ORDER — POTASSIUM CHLORIDE CRYS ER 20 MEQ PO TBCR
40.0000 meq | EXTENDED_RELEASE_TABLET | ORAL | Status: AC
  Administered 2014-05-07 (×2): 40 meq via ORAL
  Filled 2014-05-07 (×2): qty 2

## 2014-05-07 MED ORDER — HYDROMORPHONE HCL 1 MG/ML IJ SOLN
1.0000 mg | Freq: Once | INTRAMUSCULAR | Status: AC
  Administered 2014-05-07: 1 mg via INTRAVENOUS

## 2014-05-07 NOTE — Progress Notes (Signed)
Pt with temp of 100.9, refuses tylenol at this time. Will continue to monitor.

## 2014-05-07 NOTE — Progress Notes (Signed)
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 Hb SS with crisis: Pt has significant pain particularly around the joints. I suspect that he is having bony infarcts associated with his crisis. I will add clinician assisted doses for the 1st 24 hours. Pt is not using the PCa effectively as he thinks that he has to wait for 2 hours before he can push the button again. The nurse and I both educated the patient on use of the PCA. Patietn is allergic to Toradol but takes Ibuprofen. Will add Ibuprofen on a scheduled basis for the next 72 hours. Also add PPI for GI prophylaxis.  2. Fever: It is unclear that the patient has a pneumonia. His clinical picture is most consistent with fever related to Sickle Cell crisis versus inflammation associated with bone infarcts. I am more concerned of a joint or systemic infection. Will continue Rocephin for at least 48 hours until cultures are resulted and also obtain an x-ray of the left knee to evaluate for a source of fever.  3. Leukocytosis: Associated with crisis vs infection. 4. Anemia: Baseline Hb unknown but appears stable compared to last admission. 5. Hypokalemia: Check magnesium and replace potassium orally.   Code Status: Full Code Family Communication: N/A Disposition Plan: Not yet ready for discharge  [place].  Pager [phone]. If 7PM-7AM, please contact night-coverage.  05/07/2014, 11:18 AM  LOS: 1 day    Consultants:  None  Procedures:  None  Antibiotics:  Rocephin 3/22 >>  Azithromycin 3/22 >>  HPI/Subjective: Pt states pain 10/10  mostly in joints. However he has barely used the PCA because he does not seem to understand how to use it. He states that he is "wiating it out" although I'm not sure what tht means. He screams out in pain with any movement of the LE's. He has significant fear that if he has fluid in his joints that he will not be able to walk. I have explained that the pain itself may limit his ability to ambulate but that this is not usually a permenant condition in the absence of a neurological event.   Objective: Filed Vitals:   05/07/14 0351 05/07/14 0455 05/07/14 0740 05/07/14 0946  BP:  138/70  137/70  Pulse:  104  102  Temp:  100.3 F (37.9 C)  100.1 F (37.8 C)  TempSrc:  Oral  Oral  Resp: 11 12 12 14   Height:      Weight:  125 lb 7.1 oz (56.9 kg)    SpO2: 100% 100% 100% 99%   Weight change: -9 lb 8.9 oz (-4.336 kg)  Intake/Output Summary (Last 24 hours) at 05/07/14 1118 Last data filed at 05/07/14 0946  Gross per 24 hour  Intake   1580 ml  Output   3100 ml  Net  -1520 ml    General: Alert, awake, oriented x3, in significant distress secondary to pain with any movement of LE's.. Pt emaciated and appears chronically ill. HEENT: [place]/AT PEERL, EOMI, anicteric Neck: Trachea midline,  no masses, no thyromegal,y no JVD, no carotid bruit OROPHARYNX:  Moist, No exudate/ erythema/lesions.  Heart: Regular rate and rhythm, without murmurs, rubs, gallops, PMI non-displaced, no heaves or thrills on palpation.  Lungs: Clear to auscultation, no wheezing or rhonchi noted. No increased vocal fremitus resonant to percussion  Abdomen: Soft, nontender, nondistended, positive bowel sounds, no masses no hepatosplenomegaly noted. Neuro: No focal neurological deficits noted cranial nerves II through XII grossly intact. However unable to test strength or DTR's secondary to pain. Musculoskeletal: Pt has moderate swelling of the left knee but no warmth  or erythema around the joints, no spinal tenderness  noted. Psychiatric: Patient alert and oriented x3.    Data Reviewed: Basic Metabolic Panel:  Recent Labs Lab 05/05/14 1355 05/06/14 0017 05/06/14 0705 05/07/14 0937  NA 139 136  --  133*  K 3.6 3.3*  --  3.3*  CL 105 104  --  PENDING  CO2 25 25  --  27  GLUCOSE 87 97  --  112*  BUN <5* 7  --  6  CREATININE 0.61 0.46*  --  0.52  CALCIUM 9.0 8.8  --  8.4  MG  --   --  1.7  --    Liver Function Tests:  Recent Labs Lab 05/05/14 1355 05/06/14 0017  AST 22 27  ALT 11 12  ALKPHOS 92 99  BILITOT 2.4* 2.8*  PROT 7.3 7.9  ALBUMIN 3.9 4.3   No results for input(s): LIPASE, AMYLASE in the last 168 hours. No results for input(s): AMMONIA in the last 168 hours. CBC:  Recent Labs Lab 05/05/14 1355 05/06/14 0017 05/06/14 0705 05/06/14 2217  WBC 10.5 19.8* 13.4* 20.4*  NEUTROABS 7.1 15.2*  --  16.0*  HGB 11.6* 11.4* 10.4* 10.5*  HCT 34.3* 34.1* 31.0* 31.2*  MCV 83.1 83.6 83.1 83.4  PLT 441* 404* 362 315   Cardiac Enzymes: No results for input(s): CKTOTAL, CKMB, CKMBINDEX, TROPONINI in the last 168 hours. BNP (last 3 results) No results for input(s): BNP in the last 8760 hours.  ProBNP (last 3 results) No results for input(s): PROBNP in the last 8760 hours.  CBG: No results for input(s): GLUCAP in the last 168 hours.  No results found for this or any previous visit (from the past 240 hour(s)).   Studies: Dg Chest 2 View  05/06/2014   CLINICAL DATA:  Acute onset of severe upper back pain, fever and weakness. Initial encounter.  EXAM: CHEST  2 VIEW  COMPARISON:  Chest radiograph performed 03/30/2014, and CTA of the chest performed 04/01/2014  FINDINGS: The lungs are well-aerated and clear. There is no evidence of focal opacification, pleural effusion or pneumothorax.  The heart is normal in size; the mediastinal contour is within normal limits. No acute osseous abnormalities are seen.  IMPRESSION: No acute cardiopulmonary process seen.   Electronically Signed   By:  Garald Balding M.D.   On: 05/06/2014 00:22   Dg Chest Port 1 View  05/06/2014   CLINICAL DATA:  Acute onset of generalized chest pain and fever. Initial encounter.  EXAM: PORTABLE CHEST - 1 VIEW  COMPARISON:  Chest radiograph performed 05/05/2014  FINDINGS: The lungs are well-aerated. Mild vascular congestion is noted. Minimal bilateral midlung opacity may simply reflect atelectasis, or possibly mild pneumonia. There is no evidence of pleural effusion or pneumothorax.  The cardiomediastinal silhouette is within normal limits. No acute osseous abnormalities are seen.  IMPRESSION: Mild vascular congestion noted. Minimal bilateral midlung opacity may simply reflect atelectasis, or possibly mild pneumonia, depending on the patient's symptoms.   Electronically Signed  By: Garald Balding M.D.   On: 05/06/2014 22:11   Dg Shoulder Left  04/07/2014   CLINICAL DATA:  Sickle cell crisis; pt c/o left shoulder pain since yesterday, no injury; pain is posterior; imaging performed supine due to fall risk--pt was unable to oblique for grashey view due to extreme pain  EXAM: LEFT SHOULDER - 2+ VIEW  COMPARISON:  None.  FINDINGS: There is no evidence of fracture or dislocation. There is no evidence of arthropathy or other focal bone abnormality. Soft tissues are unremarkable.  IMPRESSION: Negative.   Electronically Signed   By: Lajean Manes M.D.   On: 04/07/2014 17:05    Scheduled Meds: . azithromycin  500 mg Intravenous QHS  . cefTRIAXone (ROCEPHIN)  IV  1 g Intravenous QHS  . enoxaparin (LOVENOX) injection  40 mg Subcutaneous Q24H  . folic acid  1 mg Oral Daily  . HYDROmorphone PCA 2 mg/mL   Intravenous 6 times per day  . hydroxyurea  500 mg Oral Daily  . senna-docusate  1 tablet Oral BID   Continuous Infusions: . sodium chloride 100 mL/hr at 05/06/14 0608    Time spent 45 minutes.

## 2014-05-07 NOTE — Progress Notes (Signed)
ANTIBIOTIC CONSULT NOTE - INITIAL  Pharmacy Consult for Ceftriaxone Indication: CAP  Allergies  Allergen Reactions  . Morphine And Related Hives  . Toradol [Ketorolac Tromethamine] Itching  . Tramadol Hives  . Vancomycin Itching  . Zosyn [Piperacillin Sod-Tazobactam So] Itching and Rash    Has taken rocephin in past    Patient Measurements: Height: 6\' 2"  (188 cm) Weight: 125 lb 7.1 oz (56.9 kg) IBW/kg (Calculated) : 82.2 Adjusted Body Weight:   Vital Signs: Temp: 100.3 F (37.9 C) (03/23 0455) Temp Source: Oral (03/23 0455) BP: 138/70 mmHg (03/23 0455) Pulse Rate: 104 (03/23 0455) Intake/Output from previous day: 03/22 0701 - 03/23 0700 In: 1140 [P.O.:840; I.V.:300] Out: 3100 [Urine:3100] Intake/Output from this shift: Total I/O In: 300 [I.V.:300] Out: 1650 [Urine:1650]  Labs:  Recent Labs  05/05/14 1355 05/06/14 0017 05/06/14 0705 05/06/14 2217  WBC 10.5 19.8* 13.4* 20.4*  HGB 11.6* 11.4* 10.4* 10.5*  PLT 441* 404* 362 315  CREATININE 0.61 0.46*  --   --    Estimated Creatinine Clearance: 113.6 mL/min (by C-G formula based on Cr of 0.46). No results for input(s): VANCOTROUGH, VANCOPEAK, VANCORANDOM, GENTTROUGH, GENTPEAK, GENTRANDOM, TOBRATROUGH, TOBRAPEAK, TOBRARND, AMIKACINPEAK, AMIKACINTROU, AMIKACIN in the last 72 hours.   Microbiology: No results found for this or any previous visit (from the past 720 hour(s)).  Medical History: Past Medical History  Diagnosis Date  . Sickle cell anemia   . 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 Vitamin D deficiency y-6    Medications:  Anti-infectives    Start     Dose/Rate Route Frequency Ordered Stop   05/06/14 2330  azithromycin (ZITHROMAX) 500 mg in dextrose 5 % 250 mL IVPB     500 mg 250 mL/hr over 60 Minutes Intravenous Daily at bedtime 05/06/14 2306     05/06/14 2330  cefTRIAXone (ROCEPHIN) 1 g in dextrose 5 % 50 mL IVPB - Premix     1 g 100  mL/hr over 30 Minutes Intravenous Daily at bedtime 05/06/14 2321     05/06/14 2245  levofloxacin (LEVAQUIN) IVPB 750 mg  Status:  Discontinued     750 mg 100 mL/hr over 90 Minutes Intravenous Daily at bedtime 05/06/14 2235 05/06/14 2320     Assessment: Patient with fever, increase WBC and chest Xray with ? PNA.    Goal of Therapy:  Rocephin/azithromycin based on manufacturer dosing recommendations.    Plan: Ceftriaxone 1gm iv q24hr Azithromycin 500mg  iv q24hr   Follow up culture results  Nani Skillern Crowford 05/07/2014,5:00 AM

## 2014-05-08 LAB — CBC WITH DIFFERENTIAL/PLATELET
Basophils Absolute: 0 10*3/uL (ref 0.0–0.1)
Basophils Relative: 0 % (ref 0–1)
Eosinophils Absolute: 0.1 10*3/uL (ref 0.0–0.7)
Eosinophils Relative: 1 % (ref 0–5)
HCT: 28.4 % — ABNORMAL LOW (ref 39.0–52.0)
Hemoglobin: 9.6 g/dL — ABNORMAL LOW (ref 13.0–17.0)
Lymphocytes Relative: 15 % (ref 12–46)
Lymphs Abs: 1.7 10*3/uL (ref 0.7–4.0)
MCH: 27.7 pg (ref 26.0–34.0)
MCHC: 33.8 g/dL (ref 30.0–36.0)
MCV: 82.1 fL (ref 78.0–100.0)
Monocytes Absolute: 1.1 10*3/uL — ABNORMAL HIGH (ref 0.1–1.0)
Monocytes Relative: 10 % (ref 3–12)
Neutro Abs: 8.2 10*3/uL — ABNORMAL HIGH (ref 1.7–7.7)
Neutrophils Relative %: 74 % (ref 43–77)
Platelets: 286 10*3/uL (ref 150–400)
RBC: 3.46 MIL/uL — ABNORMAL LOW (ref 4.22–5.81)
RDW: 22 % — ABNORMAL HIGH (ref 11.5–15.5)
WBC: 11.1 10*3/uL — ABNORMAL HIGH (ref 4.0–10.5)

## 2014-05-08 LAB — BASIC METABOLIC PANEL
Anion gap: 9 (ref 5–15)
BUN: 9 mg/dL (ref 6–23)
CO2: 28 mmol/L (ref 19–32)
Calcium: 8.6 mg/dL (ref 8.4–10.5)
Chloride: 100 mmol/L (ref 96–112)
Creatinine, Ser: 0.43 mg/dL — ABNORMAL LOW (ref 0.50–1.35)
GFR calc Af Amer: >90 mL/min (ref >90)
GFR calc non Af Amer: >90 mL/min (ref >90)
Glucose, Bld: 108 mg/dL — ABNORMAL HIGH (ref 70–99)
Potassium: 3.7 mmol/L (ref 3.5–5.1)
Sodium: 137 mmol/L (ref 135–145)

## 2014-05-08 LAB — URINE CULTURE: Colony Count: 30000

## 2014-05-08 LAB — LACTATE DEHYDROGENASE: LDH: 406 U/L — ABNORMAL HIGH (ref 94–250)

## 2014-05-08 MED ORDER — HYDROMORPHONE 2 MG/ML HIGH CONCENTRATION IV PCA SOLN
INTRAVENOUS | Status: DC
  Administered 2014-05-08: 4 mg via INTRAVENOUS
  Administered 2014-05-08: 5.4 mg via INTRAVENOUS
  Administered 2014-05-09: 5 mg via INTRAVENOUS
  Administered 2014-05-09: 2.5 mg via INTRAVENOUS
  Administered 2014-05-09: 4 mg via INTRAVENOUS
  Administered 2014-05-09: 06:00:00 via INTRAVENOUS
  Administered 2014-05-09: 2.5 mg via INTRAVENOUS
  Administered 2014-05-10: 7.5 mg via INTRAVENOUS
  Administered 2014-05-10: 2.5 mg via INTRAVENOUS
  Administered 2014-05-10: 7.5 mg via INTRAVENOUS
  Administered 2014-05-10: 1 mg via INTRAVENOUS
  Administered 2014-05-10: 7.5 mg via INTRAVENOUS
  Administered 2014-05-10: 1 mg via INTRAVENOUS
  Administered 2014-05-11: 3.5 mg via INTRAVENOUS
  Administered 2014-05-11: 5 mg via INTRAVENOUS
  Administered 2014-05-11: 7 mg via INTRAVENOUS
  Filled 2014-05-08 (×2): qty 25

## 2014-05-08 NOTE — Progress Notes (Signed)
Subjective: A 24 year old gentleman with sickle cell painful crisis. Patient is on Dilaudid PCA and had febrile illness. He has used 36.2 mg with 50 demands and 42 deliveries in the last 24 hours. He seems confused and slightly belligerent. Pain is down to 6 out of 10 and no shortness of breath or cough. No nausea vomiting or diarrhea. Denied any fever or chills today. His MAXIMUM TEMPERATURE is 100.9 in the last 24 hours.  Objective: Vital signs in last 24 hours: Temp:  [97.7 F (36.5 C)-100.9 F (38.3 C)] 98.6 F (37 C) (03/24 0938) Pulse Rate:  [74-110] 101 (03/24 0938) Resp:  [8-16] 12 (03/24 0938) BP: (105-140)/(51-75) 123/75 mmHg (03/24 0938) SpO2:  [92 %-100 %] 100 % (03/24 0938) FiO2 (%):  [24 %] 24 % (03/23 1530) Weight:  [57.9 kg (127 lb 10.3 oz)] 57.9 kg (127 lb 10.3 oz) (03/24 0703) Weight change:  Last BM Date: 05/07/14  Intake/Output from previous day: 03/23 0701 - 03/24 0700 In: 2400 [I.V.:2400] Out: 1870 [Urine:1870] Intake/Output this shift:    General appearance: alert, cooperative and no distress Eyes: conjunctivae/corneas clear. PERRL, EOM's intact. Fundi benign. Neck: no adenopathy, no carotid bruit, no JVD, supple, symmetrical, trachea midline and thyroid not enlarged, symmetric, no tenderness/mass/nodules Back: symmetric, no curvature. ROM normal. No CVA tenderness. Resp: clear to auscultation bilaterally Chest wall: no tenderness Cardio: regular rate and rhythm, S1, S2 normal, no murmur, click, rub or gallop GI: soft, non-tender; bowel sounds normal; no masses,  no organomegaly Extremities: extremities normal, atraumatic, no cyanosis or edema Pulses: 2+ and symmetric Skin: Skin color, texture, turgor normal. No rashes or lesions Neurologic: Grossly normal  Lab Results:  Recent Labs  05/06/14 2217 05/08/14 0358  WBC 20.4* 11.1*  HGB 10.5* 9.6*  HCT 31.2* 28.4*  PLT 315 286   BMET  Recent Labs  05/07/14 0937 05/08/14 0358  NA 133* 137  K  3.3* 3.7  CL 97 100  CO2 27 28  GLUCOSE 112* 108*  BUN 6 9  CREATININE 0.52 0.43*  CALCIUM 8.4 8.6    Studies/Results: Dg Knee 1-2 Views Left  05/07/2014   CLINICAL DATA:  Sickle cell. Left knee pain all over.  EXAM: LEFT KNEE - 1-2 VIEW  COMPARISON:  10/29/2013  FINDINGS: No fracture. Knee joint normally spaced and aligned. No joint effusion.  There are heterogeneous areas of relative increased decreased bone density, stable from prior exam. 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 This is stable.  Soft tissues are unremarkable.  IMPRESSION: No fracture or acute finding. No joint effusion. No arthropathic change.   Electronically Signed   By: Lajean Manes M.D.   On: 05/07/2014 13:45   Dg Chest Port 1 View  05/06/2014   CLINICAL DATA:  Acute onset of generalized chest pain and fever. Initial encounter.  EXAM: PORTABLE CHEST - 1 VIEW  COMPARISON:  Chest radiograph performed 05/05/2014  FINDINGS: The lungs are well-aerated. Mild vascular congestion is noted. Minimal bilateral midlung opacity may simply reflect atelectasis, or possibly mild pneumonia. There is no evidence of pleural effusion or pneumothorax.  The cardiomediastinal silhouette is within normal limits. No acute osseous abnormalities are seen.  IMPRESSION: Mild vascular congestion noted. Minimal bilateral midlung opacity may simply reflect atelectasis, or possibly mild pneumonia, depending on the patient's symptoms.   Electronically Signed   By: Garald Balding M.D.   On: 05/06/2014 22:11    Medications: I have  reviewed the patient's current medications.  Assessment/Plan: A 24 year old male admitted with sickle cell painful crisis.  #1 sickle cell painful crisis: Pain level is decreased to 6 out of 10. I will decrease his Dilaudid PCA dose to 0.5 mg per bolus. Continue with his long-acting as well as Toradol. Next  #2 febrile illness: Chest x-ray suggested possible  pneumonia however clinically does not seem to have a pneumonia. Blood cultures are negative and urine culture has multiple organisms. I will maintain the Rocephin for 1 more day until patient is completely afebrile.  #3 sickle cell anemia: Hemoglobin is still at his baseline. Continue to monitor.  #4 hypokalemia: Potassium has been repleted. Continue monitoring.  #5 leukocytosis: Most likely due to sickle cell crisis. This seems to be improving also.  LOS: 2 days   GARBA,LAWAL 05/08/2014, 10:42 AM

## 2014-05-08 NOTE — Progress Notes (Signed)
PCA high dose dilaudid Used 36.8 Demands 52 Delivered 44

## 2014-05-09 LAB — CBC WITH DIFFERENTIAL/PLATELET
Basophils Absolute: 0 10*3/uL (ref 0.0–0.1)
Basophils Relative: 0 % (ref 0–1)
Eosinophils Absolute: 0.1 10*3/uL (ref 0.0–0.7)
Eosinophils Relative: 1 % (ref 0–5)
HCT: 27.1 % — ABNORMAL LOW (ref 39.0–52.0)
Hemoglobin: 9 g/dL — ABNORMAL LOW (ref 13.0–17.0)
Lymphocytes Relative: 19 % (ref 12–46)
Lymphs Abs: 1.9 10*3/uL (ref 0.7–4.0)
MCH: 26.9 pg (ref 26.0–34.0)
MCHC: 33.2 g/dL (ref 30.0–36.0)
MCV: 80.9 fL (ref 78.0–100.0)
Monocytes Absolute: 0.6 10*3/uL (ref 0.1–1.0)
Monocytes Relative: 6 % (ref 3–12)
Neutro Abs: 7.4 10*3/uL (ref 1.7–7.7)
Neutrophils Relative %: 74 % (ref 43–77)
Platelets: 279 10*3/uL (ref 150–400)
RBC: 3.35 MIL/uL — ABNORMAL LOW (ref 4.22–5.81)
RDW: 23 % — ABNORMAL HIGH (ref 11.5–15.5)
WBC: 10 10*3/uL (ref 4.0–10.5)

## 2014-05-09 LAB — COMPREHENSIVE METABOLIC PANEL
ALT: 26 U/L (ref 0–53)
AST: 30 U/L (ref 0–37)
Albumin: 3.8 g/dL (ref 3.5–5.2)
Alkaline Phosphatase: 115 U/L (ref 39–117)
Anion gap: 11 (ref 5–15)
BUN: 6 mg/dL (ref 6–23)
CO2: 27 mmol/L (ref 19–32)
Calcium: 8.6 mg/dL (ref 8.4–10.5)
Chloride: 101 mmol/L (ref 96–112)
Creatinine, Ser: 0.39 mg/dL — ABNORMAL LOW (ref 0.50–1.35)
GFR calc Af Amer: >90 mL/min (ref >90)
GFR calc non Af Amer: >90 mL/min (ref >90)
Glucose, Bld: 89 mg/dL (ref 70–99)
Potassium: 3.9 mmol/L (ref 3.5–5.1)
Sodium: 139 mmol/L (ref 135–145)
Total Bilirubin: 5.7 mg/dL — ABNORMAL HIGH (ref 0.3–1.2)
Total Protein: 7.7 g/dL (ref 6.0–8.3)

## 2014-05-09 MED ORDER — HYDROMORPHONE HCL 1 MG/ML IJ SOLN: 0.5000 mg | Freq: Once | INTRAMUSCULAR | Status: DC

## 2014-05-09 NOTE — Progress Notes (Signed)
Pt has been talking non stop all shift.  He is now hallucinating, will hold 0300 dose of dilaudid bolus.

## 2014-05-09 NOTE — Progress Notes (Signed)
Pt used 3 mg dilaudid with 6 demands and 6 doses delivered

## 2014-05-09 NOTE — Progress Notes (Signed)
ANTIBIOTIC CONSULT NOTE - Follow Up  Pharmacy Consult for Ceftriaxone Indication: CAP  Allergies  Allergen Reactions  . Levaquin [Levofloxacin] Itching  . Morphine And Related Hives  . Toradol [Ketorolac Tromethamine] Itching  . Tramadol Hives  . Vancomycin Itching  . Zosyn [Piperacillin Sod-Tazobactam So] Itching and Rash    Has taken rocephin in past    Patient Measurements: Height: 6\' 2"  (188 cm) Weight: 125 lb 3.5 oz (56.8 kg) IBW/kg (Calculated) : 82.2   Vital Signs: Temp: 98.4 F (36.9 C) (03/25 0641) Temp Source: Oral (03/25 0641) BP: 140/72 mmHg (03/25 0641) Pulse Rate: 94 (03/25 0641) Intake/Output from previous day: 03/24 0701 - 03/25 0700 In: 3006.7 [P.O.:480; I.V.:2426.7; IV Piggyback:100] Out: 2550 [Urine:2550] Intake/Output from this shift:    Labs:  Recent Labs  05/06/14 2217 05/07/14 0937 05/08/14 0358 05/09/14 0400  WBC 20.4*  --  11.1* 10.0  HGB 10.5*  --  9.6* 9.0*  PLT 315  --  286 279  CREATININE  --  0.52 0.43* 0.39*   Estimated Creatinine Clearance: 113.4 mL/min (by C-G formula based on Cr of 0.39). No results for input(s): VANCOTROUGH, VANCOPEAK, VANCORANDOM, GENTTROUGH, GENTPEAK, GENTRANDOM, TOBRATROUGH, TOBRAPEAK, TOBRARND, AMIKACINPEAK, AMIKACINTROU, AMIKACIN in the last 72 hours.   Microbiology: Recent Results (from the past 720 hour(s))  Culture, blood (routine x 2)     Status: None (Preliminary result)   Collection Time: 05/06/14 10:10 PM  Result Value Ref Range Status   Specimen Description BLOOD RIGHT ARM  Final   Special Requests BOTTLES DRAWN AEROBIC ONLY 3CC  Final   Culture   Final           BLOOD CULTURE RECEIVED NO GROWTH TO DATE CULTURE WILL BE HELD FOR 5 DAYS BEFORE ISSUING A FINAL NEGATIVE REPORT Performed at [facility]    Report Status PENDING  Incomplete  Culture, blood (routine x 2)     Status: None (Preliminary result)   Collection Time: 05/06/14 10:17 PM  Result Value Ref Range Status   Specimen  Description BLOOD RIGHT ANTECUBITAL  Final   Special Requests BOTTLES DRAWN AEROBIC ONLY 4CC  Final   Culture   Final           BLOOD CULTURE RECEIVED NO GROWTH TO DATE CULTURE WILL BE HELD FOR 5 DAYS BEFORE ISSUING A FINAL NEGATIVE REPORT Performed at [facility]    Report Status PENDING  Incomplete  Culture, Urine     Status: None   Collection Time: 05/07/14 12:22 AM  Result Value Ref Range Status   Specimen Description URINE, CLEAN CATCH  Final   Special Requests NONE  Final   Colony Count   Final    30,000 COLONIES/ML Performed at [facility]    Culture   Final    Multiple bacterial morphotypes present, none predominant. Suggest appropriate recollection if clinically indicated. Performed at [facility]    Report Status 05/08/2014 FINAL  Final    Medical History: Past Medical History  Diagnosis Date  . Sickle cell anemia   . 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 Vitamin D deficiency y-6    Medications:  Anti-infectives    Start     Dose/Rate Route Frequency Ordered Stop   05/06/14 2330  azithromycin (ZITHROMAX) 500 mg in dextrose 5 % 250 mL IVPB  Status:  Discontinued     500 mg 250 mL/hr over 60 Minutes Intravenous Daily at  bedtime 05/06/14 2306 05/07/14 1720   05/06/14 2330  cefTRIAXone (ROCEPHIN) 1 g in dextrose 5 % 50 mL IVPB - Premix     1 g 100 mL/hr over 30 Minutes Intravenous Daily at bedtime 05/06/14 2321     05/06/14 2245  levofloxacin (LEVAQUIN) IVPB 750 mg  Status:  Discontinued     750 mg 100 mL/hr over 90 Minutes Intravenous Daily at bedtime 05/06/14 2235 05/06/14 2320     Assessment: CAP in [facility] patient. Patient with incr. WBC, fever and CXR show questionable PNA. Per patient levofloxacin = itching  3/22 >> ceftriaxone >> 3/23 >> azith x 1 dose  Tmax: Afebrile x 24hr WBCs: 10k, improved Renal: Scr 0.46, CrCl > 100 ml/min  3/22 blood x 2: ngtd 3/23 urine: 30k  multiple sp.  Goal of Therapy:  Rocephin based on manufacturer dosing recommendations. Eradication of infection  Plan:   Ceftriaxone 1gm iv q24hr - no adjustments needed  Per MD note yesterday, plan to continue one more day until patient completely afebrile  Peggyann Juba, PharmD, BCPS Pager: [phone] 05/09/2014,9:56 AM

## 2014-05-09 NOTE — Progress Notes (Signed)
Subjective: Patient is reportedly getting psychotic with extra doses of Dilaudid pushes. He complains mainly of pain in the right knee which is slightly swollen. It seems he may have infarct in the area. I doubt this is septic arthritis. He has used 24.9 mg of the Dilaudid with 66 demands and 49 deliveries in the last 24 hours. No fever in the last 24 hours no nausea nor vomiting. Patient is otherwise laying in bed has not been walking around. His pain level today is at 7 out of 10.  Objective: Vital signs in last 24 hours: Temp:  [98.1 F (36.7 C)-98.7 F (37.1 C)] 98.4 F (36.9 C) (03/25 0641) Pulse Rate:  [84-102] 94 (03/25 0641) Resp:  [10-20] 15 (03/25 0941) BP: (114-140)/(63-93) 140/72 mmHg (03/25 0641) SpO2:  [92 %-100 %] 98 % (03/25 0941) Weight:  [56.8 kg (125 lb 3.5 oz)] 56.8 kg (125 lb 3.5 oz) (03/25 0448) Weight change:  Last BM Date: 05/08/14  Intake/Output from previous day: 03/24 0701 - 03/25 0700 In: 3006.7 [P.O.:480; I.V.:2426.7; IV Piggyback:100] Out: 2550 [Urine:2550] Intake/Output this shift:    General appearance: alert, cooperative and no distress Eyes: conjunctivae/corneas clear. PERRL, EOM's intact. Fundi benign. Neck: no adenopathy, no carotid bruit, no JVD, supple, symmetrical, trachea midline and thyroid not enlarged, symmetric, no tenderness/mass/nodules Back: symmetric, no curvature. ROM normal. No CVA tenderness. Resp: clear to auscultation bilaterally Chest wall: no tenderness Cardio: regular rate and rhythm, S1, S2 normal, no murmur, click, rub or gallop GI: soft, non-tender; bowel sounds normal; no masses,  no organomegaly Extremities: extremities normal, atraumatic, no cyanosis or edema Pulses: 2+ and symmetric Skin: Skin color, texture, turgor normal. No rashes or lesions Neurologic: Grossly normal  Lab Results:  Recent Labs  05/08/14 0358 05/09/14 0400  WBC 11.1* 10.0  HGB 9.6* 9.0*  HCT 28.4* 27.1*  PLT 286 279   BMET  Recent  Labs  05/08/14 0358 05/09/14 0400  NA 137 139  K 3.7 3.9  CL 100 101  CO2 28 27  GLUCOSE 108* 89  BUN 9 6  CREATININE 0.43* 0.39*  CALCIUM 8.6 8.6    Studies/Results: Dg Knee 1-2 Views Left  05/07/2014   CLINICAL DATA:  Sickle cell. Left knee pain all over.  EXAM: LEFT KNEE - 1-2 VIEW  COMPARISON:  10/29/2013  FINDINGS: No fracture. Knee joint normally spaced and aligned. No joint effusion.  There are heterogeneous areas of relative increased decreased bone density, stable from prior exam. 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 This is stable.  Soft tissues are unremarkable.  IMPRESSION: No fracture or acute finding. No joint effusion. No arthropathic change.   Electronically Signed   By: Lajean Manes M.D.   On: 05/07/2014 13:45    Medications: I have reviewed the patient's current medications.  Assessment/Plan: A 24 year old male admitted with sickle cell painful crisis.  #1 sickle cell painful crisis: I will discontinue the physician assisted dosing and keep the patient on his PCA. Continue with his long-acting as well as Toradol. We'll try to mobilize patient has much as possible.  #2 febrile illness: This has resolved. I will discontinue the Rocephin. Most likely cause of fever is inflammatory process.  #3 sickle cell anemia: Hemoglobin is still at his baseline. Continue to monitor.  #4 hypokalemia: Potassium has been repleted. Continue monitoring.  #5 leukocytosis: Resolved. Most likely due to sickle cell disease..  LOS: 3 days   GARBA,LAWAL  05/09/2014, 11:08 AM

## 2014-05-09 NOTE — Progress Notes (Signed)
PCA dilaudid 11 attempts, 9 doses delivered, 4.5 mg

## 2014-05-09 NOTE — Progress Notes (Signed)
PCA Dilaudid 11 attempts, 11 doses delivered,  5.5mg 

## 2014-05-10 LAB — CBC WITH DIFFERENTIAL/PLATELET
Basophils Absolute: 0 10*3/uL (ref 0.0–0.1)
Basophils Relative: 0 % (ref 0–1)
Eosinophils Absolute: 0.1 10*3/uL (ref 0.0–0.7)
Eosinophils Relative: 1 % (ref 0–5)
HCT: 23.7 % — ABNORMAL LOW (ref 39.0–52.0)
Hemoglobin: 8 g/dL — ABNORMAL LOW (ref 13.0–17.0)
Lymphocytes Relative: 13 % (ref 12–46)
Lymphs Abs: 1.5 10*3/uL (ref 0.7–4.0)
MCH: 26.8 pg (ref 26.0–34.0)
MCHC: 33.8 g/dL (ref 30.0–36.0)
MCV: 79.3 fL (ref 78.0–100.0)
Monocytes Absolute: 0.6 10*3/uL (ref 0.1–1.0)
Monocytes Relative: 5 % (ref 3–12)
Neutro Abs: 9 10*3/uL — ABNORMAL HIGH (ref 1.7–7.7)
Neutrophils Relative %: 81 % — ABNORMAL HIGH (ref 43–77)
Platelets: 362 10*3/uL (ref 150–400)
RBC: 2.99 MIL/uL — ABNORMAL LOW (ref 4.22–5.81)
RDW: 23.3 % — ABNORMAL HIGH (ref 11.5–15.5)
WBC: 11.2 10*3/uL — ABNORMAL HIGH (ref 4.0–10.5)
nRBC: 1 /100 WBC — ABNORMAL HIGH

## 2014-05-10 LAB — COMPREHENSIVE METABOLIC PANEL
ALT: 21 U/L (ref 0–53)
AST: 23 U/L (ref 0–37)
Albumin: 3.7 g/dL (ref 3.5–5.2)
Alkaline Phosphatase: 105 U/L (ref 39–117)
Anion gap: 8 (ref 5–15)
BUN: 6 mg/dL (ref 6–23)
CO2: 28 mmol/L (ref 19–32)
Calcium: 8.6 mg/dL (ref 8.4–10.5)
Chloride: 99 mmol/L (ref 96–112)
Creatinine, Ser: 0.47 mg/dL — ABNORMAL LOW (ref 0.50–1.35)
GFR calc Af Amer: >90 mL/min (ref >90)
GFR calc non Af Amer: >90 mL/min (ref >90)
Glucose, Bld: 100 mg/dL — ABNORMAL HIGH (ref 70–99)
Potassium: 3.9 mmol/L (ref 3.5–5.1)
Sodium: 135 mmol/L (ref 135–145)
Total Bilirubin: 3.3 mg/dL — ABNORMAL HIGH (ref 0.3–1.2)
Total Protein: 7.6 g/dL (ref 6.0–8.3)

## 2014-05-10 MED ORDER — OXYCODONE-ACETAMINOPHEN 5-325 MG PO TABS
1.0000 | ORAL_TABLET | ORAL | Status: DC
  Administered 2014-05-10 – 2014-05-11 (×5): 1 via ORAL
  Filled 2014-05-10 (×5): qty 1

## 2014-05-10 MED ORDER — OXYCODONE HCL 5 MG PO TABS
5.0000 mg | ORAL_TABLET | ORAL | Status: DC
  Administered 2014-05-10 – 2014-05-11 (×5): 5 mg via ORAL
  Filled 2014-05-10 (×5): qty 1

## 2014-05-10 NOTE — Progress Notes (Signed)
SICKLE CELL SERVICE PROGRESS NOTE  Timothy Marks UDJ:[id number] DOB: [birth year] DOA: 05/05/2014 PCP: Ricke Hey, MD   Presenting HPI: Timothy Marks is a 24 y.o. male with a past medical history of sickle cell disease, anemia, hyperbilirubinemia, who has had multiple visits to the emergency department, both here in [place], as well as in [place]. He comes in with the above-mentioned complaints. This has been ongoing for 2 days. He was seen in the emergency department last night and was treated with pain medications. He was discharged in satisfactory condition. But the pain recurred this morning. He decided to come back. Pain is sharp pain 9 out of 10 in intensity and feels like his usual sickle cell pain. He denies any fever, chills, no diarrhea, no nausea, vomiting. Denies any falls or injuries recently. Urinating without difficulties. Is able to ambulate.  Consultants:  none  Procedures:  none  Antibiotics:  none  HPI/Subjective: Pt states that he is having pain mainly in her knees. He rates pain as a 7/10. He thinks his knees are swollen. He is getting up to go to the bathroom and is eating and drinking well.    Objective: Filed Vitals:   05/10/14 0012 05/10/14 0509 05/10/14 0517 05/10/14 1047  BP:   109/60 109/58  Pulse:   102 58  Temp:   99.5 F (37.5 C) 99.6 F (37.6 C)  TempSrc:   Oral Oral  Resp: 14 12 15 16   Height:      Weight:   126 lb 1.7 oz (57.2 kg)   SpO2: 92% 94% 100% 100%   Weight change: -1 lb 8.7 oz (-0.7 kg)  Intake/Output Summary (Last 24 hours) at 05/10/14 1320 Last data filed at 05/10/14 0900  Gross per 24 hour  Intake  513.5 ml  Output   1950 ml  Net -1436.5 ml    General: NAD, laying in bed HEENT: [place]/AT PEERL, EOMI OROPHARYNX:  Moist, No exudate/ erythema/lesions.  Heart: Regular rate and rhythm, without murmurs, rubs, gallops. Lungs: Clear to auscultation, no wheezing or rhonchi noted.  Abdomen: Soft, nontender,  nondistended, positive bowel sounds, no masses no hepatosplenomegaly noted. Neuro: No focal neurological deficits noted cranial nerves II through XII grossly intact.  Musculoskeletal: No warmth, swelling or erythema around joints, minimal tenderness of both knees. Extremities: no cyanosis or edema, tender lower extremities.  Data Reviewed: Basic Metabolic Panel:  Recent Labs Lab 05/06/14 0017 05/06/14 0705 05/07/14 0937 05/08/14 0358 05/09/14 0400 05/10/14 0655  NA 136  --  133* 137 139 135  K 3.3*  --  3.3* 3.7 3.9 3.9  CL 104  --  97 100 101 99  CO2 25  --  27 28 27 28   GLUCOSE 97  --  112* 108* 89 100*  BUN 7  --  6 9 6 6   CREATININE 0.46*  --  0.52 0.43* 0.39* 0.47*  CALCIUM 8.8  --  8.4 8.6 8.6 8.6  MG  --  1.7 1.6  --   --   --    Liver Function Tests:  Recent Labs Lab 05/05/14 1355 05/06/14 0017 05/09/14 0400 05/10/14 0655  AST 22 27 30 23   ALT 11 12 26 21   ALKPHOS 92 99 115 105  BILITOT 2.4* 2.8* 5.7* 3.3*  PROT 7.3 7.9 7.7 7.6  ALBUMIN 3.9 4.3 3.8 3.7   No results for input(s): LIPASE, AMYLASE in the last 168 hours. No results for input(s): AMMONIA in the last 168 hours. CBC:  Recent Labs  Lab 05/06/14 0017 05/06/14 0705 05/06/14 2217 05/08/14 0358 05/09/14 0400 05/10/14 0655  WBC 19.8* 13.4* 20.4* 11.1* 10.0 11.2*  NEUTROABS 15.2*  --  16.0* 8.2* 7.4 9.0*  HGB 11.4* 10.4* 10.5* 9.6* 9.0* 8.0*  HCT 34.1* 31.0* 31.2* 28.4* 27.1* 23.7*  MCV 83.6 83.1 83.4 82.1 80.9 79.3  PLT 404* 362 315 286 279 362   Cardiac Enzymes: No results for input(s): CKTOTAL, CKMB, CKMBINDEX, TROPONINI in the last 168 hours. BNP (last 3 results) No results for input(s): BNP in the last 8760 hours.  ProBNP (last 3 results) No results for input(s): PROBNP in the last 8760 hours.  CBG: No results for input(s): GLUCAP in the last 168 hours.  Recent Results (from the past 240 hour(s))  Culture, blood (routine x 2)     Status: None (Preliminary result)   Collection  Time: 05/06/14 10:10 PM  Result Value Ref Range Status   Specimen Description BLOOD RIGHT ARM  Final   Special Requests BOTTLES DRAWN AEROBIC ONLY 3CC  Final   Culture   Final           BLOOD CULTURE RECEIVED NO GROWTH TO DATE CULTURE WILL BE HELD FOR 5 DAYS BEFORE ISSUING A FINAL NEGATIVE REPORT Performed at [facility]    Report Status PENDING  Incomplete  Culture, blood (routine x 2)     Status: None (Preliminary result)   Collection Time: 05/06/14 10:17 PM  Result Value Ref Range Status   Specimen Description BLOOD RIGHT ANTECUBITAL  Final   Special Requests BOTTLES DRAWN AEROBIC ONLY 4CC  Final   Culture   Final           BLOOD CULTURE RECEIVED NO GROWTH TO DATE CULTURE WILL BE HELD FOR 5 DAYS BEFORE ISSUING A FINAL NEGATIVE REPORT Performed at [facility]    Report Status PENDING  Incomplete  Culture, Urine     Status: None   Collection Time: 05/07/14 12:22 AM  Result Value Ref Range Status   Specimen Description URINE, CLEAN CATCH  Final   Special Requests NONE  Final   Colony Count   Final    30,000 COLONIES/ML Performed at [facility]    Culture   Final    Multiple bacterial morphotypes present, none predominant. Suggest appropriate recollection if clinically indicated. Performed at [facility]    Report Status 05/08/2014 FINAL  Final     Studies: Dg Chest 2 View  05/06/2014   CLINICAL DATA:  Acute onset of severe upper back pain, fever and weakness. Initial encounter.  EXAM: CHEST  2 VIEW  COMPARISON:  Chest radiograph performed 03/30/2014, and CTA of the chest performed 04/01/2014  FINDINGS: The lungs are well-aerated and clear. There is no evidence of focal opacification, pleural effusion or pneumothorax.  The heart is normal in size; the mediastinal contour is within normal limits. No acute osseous abnormalities are seen.  IMPRESSION: No acute cardiopulmonary process seen.   Electronically Signed   By: Garald Balding M.D.   On:  05/06/2014 00:22   Dg Knee 1-2 Views Left  05/07/2014   CLINICAL DATA:  Sickle cell. Left knee pain all over.  EXAM: LEFT KNEE - 1-2 VIEW  COMPARISON:  10/29/2013  FINDINGS: No fracture. Knee joint normally spaced and aligned. No joint effusion.  There are heterogeneous areas of relative increased decreased bone density, stable from prior exam. 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 This is stable.  Soft tissues are unremarkable.  IMPRESSION: No fracture or acute finding. No joint effusion. No arthropathic change.   Electronically Signed   By: Lajean Manes M.D.   On: 05/07/2014 13:45   Dg Chest Port 1 View  05/06/2014   CLINICAL DATA:  Acute onset of generalized chest pain and fever. Initial encounter.  EXAM: PORTABLE CHEST - 1 VIEW  COMPARISON:  Chest radiograph performed 05/05/2014  FINDINGS: The lungs are well-aerated. Mild vascular congestion is noted. Minimal bilateral midlung opacity may simply reflect atelectasis, or possibly mild pneumonia. There is no evidence of pleural effusion or pneumothorax.  The cardiomediastinal silhouette is within normal limits. No acute osseous abnormalities are seen.  IMPRESSION: Mild vascular congestion noted. Minimal bilateral midlung opacity may simply reflect atelectasis, or possibly mild pneumonia, depending on the patient's symptoms.   Electronically Signed   By: Garald Balding M.D.   On: 05/06/2014 22:11    Scheduled Meds: . enoxaparin (LOVENOX) injection  40 mg Subcutaneous Q24H  . folic acid  1 mg Oral Daily  . HYDROmorphone PCA 2 mg/mL   Intravenous 6 times per day  .  HYDROmorphone (DILAUDID) injection  0.5 mg Intravenous Once  . hydroxyurea  500 mg Oral Daily  . ibuprofen  800 mg Oral TID  . oxyCODONE-acetaminophen  1 tablet Oral 6 times per day   And  . oxyCODONE  5 mg Oral 6 times per day  . pantoprazole  40 mg Oral Daily  . 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 Pt's pain is an 8/10. He used 25 mg of Dilaudid overnight. Will continue Dilaudid PCA at current dose. Start transition to PO, with home Percocet.  2) Anemia: Hgb down to 8.0, Will continue to monitor. Transfuse if hgb<6 or symptomatic.   3)Sickle Cell Care: Continue Folic acid and Hydrea.  4)Leukocytosis: Resolved. Continue to monitor.   5) FEN/GI : Regular Diet  Discontinue IV fluids Bowel regimen in place   Code Status: Full  DVT Prophylaxis: enoxaparin  Family Communication: none  Disposition Plan: none  Time spent: 15 min  Kalman Shan  Pager [phone]. If 7PM-7AM, please contact night-coverage.  05/10/2014, 1:20 PM  LOS: 4 days   Aalaya Yadao, Juliene Pina

## 2014-05-10 NOTE — Progress Notes (Signed)
Patient started getting agitated in the 2200 hours last night. Patient was having a conversation with himself and was barking like a dog. Prior to this behavior, patient requested an extra dose of Dilaudid. A one time dose of 0.5mg  of Dilaudid was ordered but was not given secondary to patient's behavior. Noticed patient had his eyes closed but was talking as if he was on his telephone. He later stated he wanted to walk and took a few steps out of his room , returned and started to cry. He stated he had no one to talk to, his mother told him not to return to her house because she has a new boyfriend who he stated was behind his mother's behavior. " I cannot believe my mother said that to me. I am her baby boy. I wish my grandparents were alive but I know they are watching over me. I can feel them. They would sit and listen to me" Patient was not easily consoled. I listened as he expressed his feeling and he eventually cried himself to sleep. He slept for the remainder of the shift. Patient now awake and stated he felt much better.

## 2014-05-11 LAB — CBC WITH DIFFERENTIAL/PLATELET
Basophils Absolute: 0 10*3/uL (ref 0.0–0.1)
Basophils Relative: 0 % (ref 0–1)
Eosinophils Absolute: 0.1 10*3/uL (ref 0.0–0.7)
Eosinophils Relative: 1 % (ref 0–5)
HCT: 22.7 % — ABNORMAL LOW (ref 39.0–52.0)
Hemoglobin: 7.7 g/dL — ABNORMAL LOW (ref 13.0–17.0)
Lymphocytes Relative: 23 % (ref 12–46)
Lymphs Abs: 2.2 10*3/uL (ref 0.7–4.0)
MCH: 26.9 pg (ref 26.0–34.0)
MCHC: 33.9 g/dL (ref 30.0–36.0)
MCV: 79.4 fL (ref 78.0–100.0)
Monocytes Absolute: 0.8 10*3/uL (ref 0.1–1.0)
Monocytes Relative: 9 % (ref 3–12)
Neutro Abs: 6.3 10*3/uL (ref 1.7–7.7)
Neutrophils Relative %: 67 % (ref 43–77)
Platelets: 406 10*3/uL — ABNORMAL HIGH (ref 150–400)
RBC: 2.86 MIL/uL — ABNORMAL LOW (ref 4.22–5.81)
RDW: 23.4 % — ABNORMAL HIGH (ref 11.5–15.5)
WBC: 9.4 10*3/uL (ref 4.0–10.5)

## 2014-05-11 LAB — RETICULOCYTES
RBC.: 2.86 MIL/uL — ABNORMAL LOW (ref 4.22–5.81)
Retic Count, Absolute: 123 10*3/uL (ref 19.0–186.0)
Retic Ct Pct: 4.3 % — ABNORMAL HIGH (ref 0.4–3.1)

## 2014-05-11 LAB — LACTATE DEHYDROGENASE: LDH: 299 U/L — ABNORMAL HIGH (ref 94–250)

## 2014-05-11 MED ORDER — OXYCODONE-ACETAMINOPHEN 10-325 MG PO TABS: 1.0000 | ORAL_TABLET | ORAL | Status: DC | PRN

## 2014-05-11 NOTE — Discharge Summary (Signed)
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   Specialty:  Family Medicine   Contact information:   [address] [place] [address] [phone]       Diet:Regular  Wt Readings from Last 3 Encounters:  05/10/14 126 lb 1.7 oz (57.2 kg)  03/30/14 135 lb (61.236 kg)  04/01/14 135 lb (61.236 kg)    History of present illness:  Timothy Marks is a 24 y.o. male with Past medical history of sickle cell disease. The patient is presenting with complaints of back pain leg pain and chest pain. This has started since last 2 days. He mentions he was stable earlier and he actually ran out of his oxycodone 2 weeks ago and has not tried to refill it at present. He has been taking ibuprofen which was not helping him. He denies any fever chills cough, diarrhea nausea vomiting, burning urination. 2 days ago he had an episode of priapism that lasted for 30 minutes. He mentions that he has been having this kind of episodes more frequently. He does not have any priapism at present.  The patient is coming from home. And at his baseline independent for most of his ADL.  Hospital Course by Problem:  Sickle Cell Crisis: Patient presented with pain characteristic of acute vaso-occlusive crisis.Pt's pain was treated with bolus IV analgesics initially and was later transitioned with a Dilaudid PCA and ketorolac. PCA was decreased as  pain improved and he was started on his home dose of Percocet. His pain was well controlled with her oral home regimen without much need for PCA. He had overall improvement of his pain and was physically functional upon discharge. In addition, at the beginning of his admission, he had leukocytosis and fever. It was likely due to him being in acute crisis. Pt was placed on Rocephin initially but it was later discontinued.  Hypokalemia: His potassium required repletion.  Discharge Exam: Filed Vitals:   05/11/14 1405  BP: 99/44  Pulse: 81  Temp: 99.1 F (37.3 C)  Resp: 16   Filed Vitals:   05/11/14 0545 05/11/14 0616 05/11/14 1059 05/11/14 1405  BP:  100/42  99/44  Pulse:  89  81  Temp:  98.4 F (36.9 C)  99.1 F (37.3 C)  TempSrc:  Oral  Oral  Resp: 12 12 13 16   Height:      Weight:      SpO2: 100% 100% 100% 95%   General: Alert, awake, oriented x3, in no acute distress.  HEENT: [place]/AT PEERL, EOMI Neck: Trachea midline,  no masses or lymphadenopathy OROPHARYNX:  Moist, No exudate/ erythema/lesions.  Heart: Regular rate and rhythm, without murmurs, rubs, gallops Lungs: Clear to auscultation, no wheezing or rhonchi noted. No increased vocal fremitus resonant to percussion  Abdomen: Soft, nontender, nondistended, positive bowel sounds, no masses no hepatosplenomegaly noted..  Neuro: No focal neurological deficits noted cranial nerves II through XII grossly intact. Strength 5 out  of 5 in bilateral upper and lower extremities. Musculoskeletal: No warm swelling or erythema around joints, no spinal tenderness noted. Psychiatric: Patient alert and oriented x3, good insight and cognition Lymph node survey: No cervical axillary or inguinal lymphadenopathy noted. Extremities: no cyanosis, or edema  Discharge Instructions   Pt given prescription for Percocet. He was instructed to establish care with a primary who will prescribe his pain meds for him.    Medication List    STOP taking  these medications        cefUROXime 500 MG tablet  Commonly known as:  CEFTIN      TAKE these medications        aspirin 81 MG chewable tablet  Chew 1 tablet (81 mg total) by mouth daily.     cholecalciferol 1000 UNITS tablet  Commonly known as:  VITAMIN D  Take 2 tablets (2,000 Units total) by mouth daily.     diclofenac sodium 1 % Gel  Commonly known as:  VOLTAREN  Apply 2 g topically 4 (four) times daily.     escitalopram 10 MG tablet  Commonly known as:  LEXAPRO  Take 1 tablet (10 mg total) by mouth daily.     feeding supplement (ENSURE COMPLETE) Liqd  Take 237 mLs by mouth 3 (three) times daily between meals.     folic acid 1 MG tablet  Commonly known as:  FOLVITE  Take 1 mg by mouth daily.     hydroxyurea 500 MG capsule  Commonly known as:  HYDREA  Take 500 mg by mouth daily. May take with food to minimize GI side effects.     hydrOXYzine 50 MG tablet  Commonly known as:  ATARAX/VISTARIL  Take 1 tablet (50 mg total) by mouth at bedtime.     ibuprofen 800 MG tablet  Commonly known as:  ADVIL,MOTRIN  Take 1 tablet (800 mg total) by mouth 3 (three) times daily.     methocarbamol 500 MG tablet  Commonly known as:  ROBAXIN  Take 1 tablet (500 mg total) by mouth 2 (two) times daily.     oxyCODONE-acetaminophen 10-325 MG per tablet  Commonly known as:  PERCOCET  Take 1 tablet by mouth every 4 (four) hours as needed for pain.     polyethylene glycol packet  Commonly known as:  MIRALAX / GLYCOLAX  Take 17 g by mouth daily as needed for mild constipation.     senna-docusate 8.6-50 MG per tablet  Commonly known as:  Senokot-S  Take 1 tablet by mouth 2 (two) times daily.          The results of significant diagnostics from this hospitalization (including imaging, microbiology, ancillary and laboratory) are listed below for reference.    Significant Diagnostic Studies: Dg Chest 2 View  05/06/2014   CLINICAL DATA:  Acute onset of severe upper back pain,  fever and weakness. Initial encounter.  EXAM: CHEST  2 VIEW  COMPARISON:  Chest radiograph performed 03/30/2014, and CTA of the chest performed 04/01/2014  FINDINGS: The lungs are well-aerated and clear. There is no evidence of focal opacification, pleural effusion or pneumothorax.  The heart is normal in size; the mediastinal contour is within normal limits. No acute osseous abnormalities are seen.  IMPRESSION: No acute cardiopulmonary process seen.   Electronically Signed   By: Garald Balding M.D.   On: 05/06/2014 00:22   Dg Knee 1-2 Views Left  05/07/2014   CLINICAL DATA:  Sickle cell. Left knee pain all over.  EXAM: LEFT KNEE - 1-2 VIEW  COMPARISON:  10/29/2013  FINDINGS: No fracture. Knee joint normally spaced and aligned. No joint effusion.  There are heterogeneous areas of relative increased decreased bone density, stable from prior exam. 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 This is stable.  Soft tissues are unremarkable.  IMPRESSION: No fracture or acute finding. No joint effusion. No arthropathic change.   Electronically Signed   By: Lajean Manes M.D.   On: 05/07/2014 13:45   Dg Chest Port 1 View  05/06/2014   CLINICAL DATA:  Acute onset of generalized chest pain and fever. Initial encounter.  EXAM: PORTABLE CHEST - 1 VIEW  COMPARISON:  Chest radiograph performed 05/05/2014  FINDINGS: The lungs are well-aerated. Mild vascular congestion is noted. Minimal bilateral midlung opacity may simply reflect atelectasis, or possibly mild pneumonia. There is no evidence of pleural effusion or pneumothorax.  The cardiomediastinal silhouette is within normal limits. No acute osseous abnormalities are seen.  IMPRESSION: Mild vascular congestion noted. Minimal bilateral midlung opacity may simply reflect atelectasis, or possibly mild pneumonia, depending on the patient's symptoms.   Electronically Signed   By: Garald Balding M.D.   On: 05/06/2014  22:11    Microbiology: Recent Results (from the past 240 hour(s))  Culture, blood (routine x 2)     Status: None (Preliminary result)   Collection Time: 05/06/14 10:10 PM  Result Value Ref Range Status   Specimen Description BLOOD RIGHT ARM  Final   Special Requests BOTTLES DRAWN AEROBIC ONLY 3CC  Final   Culture   Final           BLOOD CULTURE RECEIVED NO GROWTH TO DATE CULTURE WILL BE HELD FOR 5 DAYS BEFORE ISSUING A FINAL NEGATIVE REPORT Performed at [facility]    Report Status PENDING  Incomplete  Culture, blood (routine x 2)     Status: None (Preliminary result)   Collection Time: 05/06/14 10:17 PM  Result Value Ref Range Status   Specimen Description BLOOD RIGHT ANTECUBITAL  Final   Special Requests BOTTLES DRAWN AEROBIC ONLY 4CC  Final   Culture   Final           BLOOD CULTURE RECEIVED NO GROWTH TO DATE CULTURE WILL BE HELD FOR 5 DAYS BEFORE ISSUING A FINAL NEGATIVE REPORT Performed at [facility]    Report Status PENDING  Incomplete  Culture, Urine     Status: None   Collection Time: 05/07/14 12:22 AM  Result Value Ref Range Status   Specimen Description URINE, CLEAN CATCH  Final   Special Requests NONE  Final   Colony Count   Final    30,000 COLONIES/ML Performed at [facility]    Culture   Final    Multiple bacterial morphotypes present, none predominant. Suggest appropriate recollection if clinically indicated. Performed at [facility]    Report Status 05/08/2014 FINAL  Final     Labs: Basic Metabolic Panel:  Recent Labs Lab 05/06/14 0017 05/06/14 0705 05/07/14 0937 05/08/14 0358 05/09/14 0400 05/10/14 0655  NA 136  --  133* 137 139 135  K 3.3*  --  3.3* 3.7 3.9 3.9  CL 104  --  97 100 101 99  CO2 25  --  27 28 27 28   GLUCOSE 97  --  112* 108* 89 100*  BUN 7  --  6 9 6 6   CREATININE 0.46*  --  0.52 0.43* 0.39* 0.47*  CALCIUM 8.8  --  8.4 8.6 8.6 8.6  MG  --  1.7 1.6  --   --   --    Liver Function  Tests:  Recent Labs Lab 05/05/14 1355 05/06/14 0017 05/09/14 0400 05/10/14 0655  AST 22 27 30 23   ALT 11 12 26 21   ALKPHOS 92 99 115 105  BILITOT 2.4* 2.8* 5.7* 3.3*  PROT 7.3 7.9 7.7 7.6  ALBUMIN 3.9 4.3 3.8 3.7   No results for input(s): LIPASE, AMYLASE in the last 168 hours. No results for input(s): AMMONIA in the last 168 hours. CBC:  Recent Labs Lab 05/06/14 2217 05/08/14 0358 05/09/14 0400 05/10/14 0655 05/11/14 0416  WBC 20.4* 11.1* 10.0 11.2* 9.4  NEUTROABS 16.0* 8.2* 7.4 9.0* 6.3  HGB 10.5* 9.6* 9.0* 8.0* 7.7*  HCT 31.2* 28.4* 27.1* 23.7* 22.7*  MCV 83.4 82.1 80.9 79.3 79.4  PLT 315 286 279 362 406*   Cardiac Enzymes: No results for input(s): CKTOTAL, CKMB, CKMBINDEX, TROPONINI in the last 168 hours. BNP: Invalid input(s): POCBNP CBG: No results for input(s): GLUCAP in the last 168 hours.  Time coordinating discharge: >38min  SignedKalman Shan Sickle Cell Service 05/11/2014, 5:12 PM

## 2014-05-11 NOTE — Discharge Instructions (Signed)
Anemia, Nonspecific Anemia is a condition in which the concentration of red blood cells or hemoglobin in the blood is below normal. Hemoglobin is a substance in red blood cells that carries oxygen to the tissues of the body. Anemia results in not enough oxygen reaching these tissues.  CAUSES  Common causes of anemia include:   Excessive bleeding. Bleeding may be internal or external. This includes excessive bleeding from periods (in women) or from the intestine.   Poor nutrition.   Chronic kidney, thyroid, and liver disease.  Bone marrow disorders that decrease red blood cell production.  Cancer and treatments for cancer.  HIV, AIDS, and their treatments.  Spleen problems that increase red blood cell destruction.  Blood disorders.  Excess destruction of red blood cells due to infection, medicines, and autoimmune disorders. SIGNS AND SYMPTOMS   Minor weakness.   Dizziness.   Headache.  Palpitations.   Shortness of breath, especially with exercise.   Paleness.  Cold sensitivity.  Indigestion.  Nausea.  Difficulty sleeping.  Difficulty concentrating. Symptoms may occur suddenly or they may develop slowly.  DIAGNOSIS  Additional blood tests are often needed. These help your health care provider determine the best treatment. Your health care provider will check your stool for blood and look for other causes of blood loss.  TREATMENT  Treatment varies depending on the cause of the anemia. Treatment can include:   Supplements of iron, vitamin B12, or folic acid.   Hormone medicines.   A blood transfusion. This may be needed if blood loss is severe.   Hospitalization. This may be needed if there is significant continual blood loss.   Dietary changes.  Spleen removal. HOME CARE INSTRUCTIONS Keep all follow-up appointments. It often takes many weeks to correct anemia, and having your health care provider check on your condition and your response to  treatment is very important. SEEK IMMEDIATE MEDICAL CARE IF:   You develop extreme weakness, shortness of breath, or chest pain.   You become dizzy or have trouble concentrating.  You develop heavy vaginal bleeding.   You develop a rash.   You have bloody or black, tarry stools.   You faint.   You vomit up blood.   You vomit repeatedly.   You have abdominal pain.  You have a fever or persistent symptoms for more than 2-3 days.   You have a fever and your symptoms suddenly get worse.   You are dehydrated.  MAKE SURE YOU:  Understand these instructions.  Will watch your condition.  Will get help right away if you are not doing well or get worse. Document Released: 03/10/2004 Document Revised: 10/03/2012 Document Reviewed: 07/27/2012 ExitCare Patient Information 2015 [employer]. This information is not intended to replace advice given to you by your health care provider. Make sure you discuss any questions you have with your health care provider.  

## 2014-05-11 NOTE — Clinical Social Work Note (Signed)
CSW received a call from RN stating that pt had no ride home today  CSW met with pt at bedside to assess and confirm address  CSW provided pt with a taxi voucher  .Dede Query, LCSW [facility] Clinical Social Worker - Weekend Coverage cell #: [phone]

## 2014-05-11 NOTE — Progress Notes (Signed)
Discharge instructions explained to pt. States understanding. Pt waiting for girlfriend to pick him up.  Script given to pt.

## 2014-05-11 NOTE — Progress Notes (Signed)
Taxie voucher given to pt.

## 2014-05-13 LAB — CULTURE, BLOOD (ROUTINE X 2)
Culture: NO GROWTH
Culture: NO GROWTH

## 2014-06-30 ENCOUNTER — Encounter (HOSPITAL_COMMUNITY): Payer: Self-pay | Admitting: Nurse Practitioner

## 2014-06-30 ENCOUNTER — Inpatient Hospital Stay (HOSPITAL_COMMUNITY)
Admission: EM | Admit: 2014-06-30 | Discharge: 2014-07-05 | DRG: 811 | Disposition: A | Discharge status: Home / Self Care | Payer: Medicaid Other | Attending: Internal Medicine | Admitting: Internal Medicine

## 2014-06-30 DIAGNOSIS — E876 Hypokalemia: Secondary | ICD-10-CM | POA: Diagnosis present

## 2014-06-30 DIAGNOSIS — Z681 Body mass index (BMI) 19 or less, adult: Secondary | ICD-10-CM | POA: Diagnosis not present

## 2014-06-30 DIAGNOSIS — M545 Low back pain: Secondary | ICD-10-CM | POA: Diagnosis present

## 2014-06-30 DIAGNOSIS — R4689 Other symptoms and signs involving appearance and behavior: Secondary | ICD-10-CM | POA: Diagnosis present

## 2014-06-30 DIAGNOSIS — Z886 Allergy status to analgesic agent status: Secondary | ICD-10-CM

## 2014-06-30 DIAGNOSIS — D5700 Hb-SS disease with crisis, unspecified: Principal | ICD-10-CM | POA: Diagnosis present

## 2014-06-30 DIAGNOSIS — Z9114 Patient's other noncompliance with medication regimen: Secondary | ICD-10-CM | POA: Diagnosis present

## 2014-06-30 DIAGNOSIS — E43 Unspecified severe protein-calorie malnutrition: Secondary | ICD-10-CM | POA: Diagnosis present

## 2014-06-30 DIAGNOSIS — Z7982 Long term (current) use of aspirin: Secondary | ICD-10-CM

## 2014-06-30 DIAGNOSIS — M87812 Other osteonecrosis, left shoulder: Secondary | ICD-10-CM | POA: Diagnosis present

## 2014-06-30 DIAGNOSIS — F332 Major depressive disorder, recurrent severe without psychotic features: Secondary | ICD-10-CM | POA: Diagnosis present

## 2014-06-30 DIAGNOSIS — M90512 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, left shoulder: Secondary | ICD-10-CM | POA: Diagnosis present

## 2014-06-30 DIAGNOSIS — Z881 Allergy status to other antibiotic agents status: Secondary | ICD-10-CM

## 2014-06-30 DIAGNOSIS — D72829 Elevated white blood cell count, unspecified: Secondary | ICD-10-CM | POA: Diagnosis present

## 2014-06-30 DIAGNOSIS — Z9119 Patient's noncompliance with other medical treatment and regimen: Secondary | ICD-10-CM | POA: Diagnosis present

## 2014-06-30 DIAGNOSIS — Z9049 Acquired absence of other specified parts of digestive tract: Secondary | ICD-10-CM | POA: Diagnosis present

## 2014-06-30 DIAGNOSIS — R17 Unspecified jaundice: Secondary | ICD-10-CM | POA: Diagnosis present

## 2014-06-30 HISTORY — DX: Major depressive disorder, single episode, unspecified: F32.9

## 2014-06-30 HISTORY — DX: Depression, unspecified: F32.A

## 2014-06-30 LAB — CBC WITH DIFFERENTIAL/PLATELET
Basophils Absolute: 0 10*3/uL (ref 0.0–0.1)
Basophils Relative: 0 % (ref 0–1)
Eosinophils Absolute: 0.2 10*3/uL (ref 0.0–0.7)
Eosinophils Relative: 2 % (ref 0–5)
HCT: 23 % — ABNORMAL LOW (ref 39.0–52.0)
Hemoglobin: 8.2 g/dL — ABNORMAL LOW (ref 13.0–17.0)
Lymphocytes Relative: 33 % (ref 12–46)
Lymphs Abs: 3.9 10*3/uL (ref 0.7–4.0)
MCH: 29.1 pg (ref 26.0–34.0)
MCHC: 35.7 g/dL (ref 30.0–36.0)
MCV: 81.6 fL (ref 78.0–100.0)
Monocytes Absolute: 1.2 10*3/uL — ABNORMAL HIGH (ref 0.1–1.0)
Monocytes Relative: 10 % (ref 3–12)
Neutro Abs: 6.4 10*3/uL (ref 1.7–7.7)
Neutrophils Relative %: 55 % (ref 43–77)
Platelets: 443 10*3/uL — ABNORMAL HIGH (ref 150–400)
RBC: 2.82 MIL/uL — ABNORMAL LOW (ref 4.22–5.81)
RDW: 24.1 % — ABNORMAL HIGH (ref 11.5–15.5)
WBC: 11.7 10*3/uL — ABNORMAL HIGH (ref 4.0–10.5)

## 2014-06-30 LAB — COMPREHENSIVE METABOLIC PANEL
ALT: 13 U/L — ABNORMAL LOW (ref 17–63)
AST: 32 U/L (ref 15–41)
Albumin: 4.2 g/dL (ref 3.5–5.0)
Alkaline Phosphatase: 83 U/L (ref 38–126)
Anion gap: 9 (ref 5–15)
BUN: <5 mg/dL — ABNORMAL LOW (ref 6–20)
CO2: 25 mmol/L (ref 22–32)
Calcium: 8.9 mg/dL (ref 8.9–10.3)
Chloride: 105 mmol/L (ref 101–111)
Creatinine, Ser: 0.59 mg/dL — ABNORMAL LOW (ref 0.61–1.24)
GFR calc Af Amer: >60 mL/min (ref >60)
GFR calc non Af Amer: >60 mL/min (ref >60)
Glucose, Bld: 106 mg/dL — ABNORMAL HIGH (ref 65–99)
Potassium: 3.4 mmol/L — ABNORMAL LOW (ref 3.5–5.1)
Sodium: 139 mmol/L (ref 135–145)
Total Bilirubin: 5.7 mg/dL — ABNORMAL HIGH (ref 0.3–1.2)
Total Protein: 6.9 g/dL (ref 6.5–8.1)

## 2014-06-30 LAB — RETICULOCYTES
RBC.: 2.82 MIL/uL — ABNORMAL LOW (ref 4.22–5.81)
Retic Count, Absolute: 603.5 10*3/uL — ABNORMAL HIGH (ref 19.0–186.0)
Retic Ct Pct: 21.4 % — ABNORMAL HIGH (ref 0.4–3.1)

## 2014-06-30 MED ORDER — HYDROMORPHONE HCL 1 MG/ML IJ SOLN
2.0000 mg | Freq: Once | INTRAMUSCULAR | Status: AC
  Administered 2014-06-30: 2 mg via INTRAVENOUS
  Filled 2014-06-30: qty 2

## 2014-06-30 MED ORDER — ONDANSETRON HCL 4 MG/2ML IJ SOLN
4.0000 mg | Freq: Four times a day (QID) | INTRAMUSCULAR | Status: DC | PRN
  Administered 2014-06-30: 4 mg via INTRAVENOUS
  Filled 2014-06-30: qty 2

## 2014-06-30 MED ORDER — SODIUM CHLORIDE 0.9 % IV BOLUS (SEPSIS)
1000.0000 mL | Freq: Once | INTRAVENOUS | Status: AC
Start: 2014-06-30 — End: 2014-06-30
  Administered 2014-06-30: 1000 mL via INTRAVENOUS

## 2014-06-30 MED ORDER — DEXTROSE-NACL 5-0.45 % IV SOLN
INTRAVENOUS | Status: AC
  Administered 2014-06-30: 150 mL/h via INTRAVENOUS
  Administered 2014-07-01: 06:00:00 via INTRAVENOUS
  Administered 2014-07-02 (×2): 75 mL/h via INTRAVENOUS

## 2014-06-30 MED ORDER — ONDANSETRON HCL 4 MG/2ML IJ SOLN
4.0000 mg | Freq: Once | INTRAMUSCULAR | Status: AC
  Administered 2014-06-30: 4 mg via INTRAVENOUS
  Filled 2014-06-30: qty 2

## 2014-06-30 MED ORDER — HYDROMORPHONE HCL 1 MG/ML IJ SOLN
1.0000 mg | INTRAMUSCULAR | Status: DC | PRN
  Administered 2014-06-30 (×2): 1 mg via INTRAVENOUS
  Filled 2014-06-30 (×2): qty 1

## 2014-06-30 MED ORDER — HYDROMORPHONE HCL 1 MG/ML IJ SOLN
1.0000 mg | Freq: Once | INTRAMUSCULAR | Status: AC
  Administered 2014-06-30: 1 mg via INTRAVENOUS
  Filled 2014-06-30: qty 1

## 2014-06-30 MED ORDER — DIPHENHYDRAMINE HCL 12.5 MG/5ML PO ELIX: 12.5000 mg | ORAL_SOLUTION | Freq: Four times a day (QID) | ORAL | Status: DC | PRN

## 2014-06-30 MED ORDER — DIPHENHYDRAMINE HCL 50 MG/ML IJ SOLN
12.5000 mg | Freq: Four times a day (QID) | INTRAMUSCULAR | Status: DC | PRN
  Administered 2014-07-01: 12.5 mg via INTRAVENOUS
  Filled 2014-06-30: qty 1

## 2014-06-30 NOTE — ED Notes (Signed)
hes having lower back pain since this morning, it feels like his sickle cell pain. He tried ibuprofen with no relief.

## 2014-06-30 NOTE — ED Provider Notes (Signed)
CSN: [id number]     Arrival date & time 06/30/14  1658 History  This chart was scribed for non-physician practitioner, Baron Sane, working with Virgel Manifold, MD by Molli Posey, ED Scribe. This patient was seen in room TR03C/TR03C and the patient's care was started at 7:28 PM.    Chief Complaint  Patient presents with  . Sickle Cell Pain Crisis   The history is provided by the patient. No language interpreter was used.   HPI Comments: Timothy Marks is a 24 y.o. male with a history of sickle cell anemia who presents to the Emergency Department complaining of gradually worsening lower back pain that started this morning. Pt denies any falls or injuries. He states that his pain feels similar to his previous sickle cell pain and states that he typically experiences sickle cell pain in his lower back. Pt states that he tried taking Ibuprofen and percocet which he ran out of a few days ago. He states that Dr. Alyson Ingles is his PCP and typically manages his sickle cell. Pt denies any hx of spinal infections. He denies fever, CP, trouble breathing, bowel or bladder incontinence, numbness or weakness.   Past Medical History  Diagnosis Date  . Sickle cell anemia   . 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 Vitamin D deficiency y-6  . Depression    Past Surgical History  Procedure Laterality Date  . Cholecystectomy     Family History  Problem Relation Age of Onset  . Sickle cell trait Mother   . Sickle cell trait Father    History  Substance Use Topics  . Smoking status: Never Smoker   . Smokeless tobacco: Never Used  . Alcohol Use: No    Review of Systems  Constitutional: Negative for fever.  Cardiovascular: Negative for chest pain.  Musculoskeletal: Positive for back pain.  Neurological: Negative for weakness and numbness.  All other systems reviewed and are negative.   Allergies  Levaquin; Morphine and related;  Toradol; Tramadol; Vancomycin; and Zosyn  Home Medications   Prior to Admission medications   Medication Sig Start Date End Date Taking? Authorizing Provider  folic acid (FOLVITE) 1 MG tablet Take 1 mg by mouth daily.   Yes Historical Provider, MD  hydroxyurea (HYDREA) 500 MG capsule Take 500 mg by mouth daily. May take with food to minimize GI side effects.   Yes Historical Provider, MD  oxyCODONE-acetaminophen (PERCOCET) 10-325 MG per tablet Take 1 tablet by mouth every 4 (four) hours as needed for pain. 05/11/14  Yes Olabunmi A Agboola, MD  aspirin 81 MG chewable tablet Chew 1 tablet (81 mg total) by mouth daily. Patient not taking: Reported on 05/05/2014 04/09/14   Leana Gamer, MD  cefUROXime (CEFTIN) 500 MG tablet Take 1 tablet (500 mg total) by mouth 2 (two) times daily with a meal. Patient not taking: Reported on 05/05/2014 04/09/14   Leana Gamer, MD  cholecalciferol (VITAMIN D) 1000 UNITS tablet Take 2 tablets (2,000 Units total) by mouth daily. Patient not taking: Reported on 05/05/2014 04/09/14   Leana Gamer, MD  diclofenac sodium (VOLTAREN) 1 % GEL Apply 2 g topically 4 (four) times daily. Patient not taking: Reported on 05/05/2014 04/09/14   Leana Gamer, MD  escitalopram (LEXAPRO) 10 MG tablet Take 1 tablet (10 mg total) by mouth daily. Patient not taking: Reported on 05/05/2014 04/09/14   Leana Gamer, MD  feeding supplement, ENSURE COMPLETE, (ENSURE  COMPLETE) LIQD Take 237 mLs by mouth 3 (three) times daily between meals. Patient not taking: Reported on 05/05/2014 04/09/14   Leana Gamer, MD  hydrOXYzine (ATARAX/VISTARIL) 50 MG tablet Take 1 tablet (50 mg total) by mouth at bedtime. Patient not taking: Reported on 05/05/2014 04/09/14   Leana Gamer, MD  ibuprofen (ADVIL,MOTRIN) 800 MG tablet Take 1 tablet (800 mg total) by mouth 3 (three) times daily. Patient not taking: Reported on 05/05/2014 02/12/14   Alexa Sherral Hammers, MD  methocarbamol  (ROBAXIN) 500 MG tablet Take 1 tablet (500 mg total) by mouth 2 (two) times daily. Patient not taking: Reported on 03/29/2014 10/06/13   Fransico Meadow, PA-C  polyethylene glycol [facility] / Floria Raveling) packet Take 17 g by mouth daily as needed for mild constipation. Patient not taking: Reported on 05/05/2014 04/09/14   Leana Gamer, MD  senna-docusate (SENOKOT-S) 8.6-50 MG per tablet Take 1 tablet by mouth 2 (two) times daily. Patient not taking: Reported on 05/05/2014 04/09/14   Leana Gamer, MD   BP 107/68 mmHg  Pulse 100  Temp(Src) 98.4 F (36.9 C) (Oral)  Resp 15  SpO2 98%   Physical Exam  Constitutional: He is oriented to person, place, and time. He appears well-developed and well-nourished. No distress.  HENT:  Head: Normocephalic and atraumatic.  Right Ear: External ear normal.  Left Ear: External ear normal.  Nose: Nose normal.  Mouth/Throat: Oropharynx is clear and moist.  Eyes: Conjunctivae are normal.  Neck: Normal range of motion. Neck supple.  No nuchal rigidity.   Cardiovascular: Normal rate, regular rhythm and normal heart sounds.   Pulmonary/Chest: Effort normal and breath sounds normal. He exhibits no tenderness.  Abdominal: Soft.  Musculoskeletal: Normal range of motion.  Sensation grossly intact. No spinous process tenderness. Bilateral lower back tenderness appreciated without deformity.  Neurological: He is alert and oriented to person, place, and time.  Skin: Skin is warm and dry. He is not diaphoretic.  Psychiatric: He has a normal mood and affect.  Nursing note and vitals reviewed.   ED Course  Procedures   Medications  dextrose 5 %-0.45 % sodium chloride infusion (150 mL/hr Intravenous New Bag/Given 06/30/14 2214)  HYDROmorphone (DILAUDID) injection 1 mg (not administered)  ondansetron (ZOFRAN) injection 4 mg (not administered)  sodium chloride 0.9 % bolus 1,000 mL (0 mLs Intravenous Stopped 06/30/14 2026)  HYDROmorphone (DILAUDID) injection 2  mg (2 mg Intravenous Given 06/30/14 1936)  ondansetron (ZOFRAN) injection 4 mg (4 mg Intravenous Given 06/30/14 1935)  HYDROmorphone (DILAUDID) injection 2 mg (2 mg Intravenous Given 06/30/14 2023)  HYDROmorphone (DILAUDID) injection 1 mg (1 mg Intravenous Given 06/30/14 2103)  HYDROmorphone (DILAUDID) injection 1 mg (1 mg Intravenous Given 06/30/14 2236)    DIAGNOSTIC STUDIES: Oxygen Saturation is 94% on RA, normal by my interpretation.    COORDINATION OF CARE: 7:32 PM Discussed treatment plan with pt at bedside and pt agreed to plan.  Labs Review Labs Reviewed  CBC WITH DIFFERENTIAL/PLATELET - Abnormal; Notable for the following:    WBC 11.7 (*)    RBC 2.82 (*)    Hemoglobin 8.2 (*)    HCT 23.0 (*)    RDW 24.1 (*)    Platelets 443 (*)    Monocytes Absolute 1.2 (*)    All other components within normal limits  COMPREHENSIVE METABOLIC PANEL - Abnormal; Notable for the following:    Potassium 3.4 (*)    Glucose, Bld 106 (*)    BUN <5 (*)  Creatinine, Ser 0.59 (*)    ALT 13 (*)    Total Bilirubin 5.7 (*)    All other components within normal limits  RETICULOCYTES - Abnormal; Notable for the following:    Retic Ct Pct 21.4 (*)    RBC. 2.82 (*)    Retic Count, Manual 603.5 (*)    All other components within normal limits    Imaging Review No results found.   EKG Interpretation None      MDM   Final diagnoses:  Sickle cell pain crisis   Filed Vitals:   06/30/14 2248  BP: 107/68  Pulse: 100  Temp:   Resp: 15   Afebrile, NAD, non-toxic appearing, AAOx4.  I have reviewed nursing notes, vital signs, and all appropriate lab and imaging results if ordered as above.  24 year old male with persistent sickle cell pain. He is not complaining of chest pain at this time. Anemia is stable. Reticulocyte count is elevated He does not have any signs of acute chest process or infection. Denies any fever, shortness of breath or dyspnea. Patient has received 3 rounds of Dilaudid  with only minimal improvement. Discussed with [facility] hospitalist to admit the patient in transfer to [facility] for further pain management. Patient d/w with Dr. Wilson Singer, agrees with plan.    I personally performed the services described in this documentation, which was scribed in my presence. The recorded information has been reviewed and is accurate.       Baron Sane, PA-C 06/30/14 2251  Virgel Manifold, MD 07/02/14 1224

## 2014-06-30 NOTE — ED Notes (Signed)
Called [facility] for [employer]

## 2014-06-30 NOTE — H&P (Signed)
[facility] History and Physical  Jerid Catherman TGG:[id number] DOB: 1990/05/19 DOA: 06/30/2014  Referring physician: ED physician PCP: Timothy Hey, MD  Specialists:   Chief Complaint: Lower back pain  HPI: [name] is a 24 y.o. male with PMH of sickle cess disease, depression, osteo necrosis, who presents with lower back pain  Patient reports that he started having lower back pain in morning, which has been progressively getting worse. Pt denies any falls or injuries. He states that his pain feels similar to his previous sickle cell pain. He states that he typically experiences sickle cell pain in his lower back. Pt denies any hx of spinal infections. No fever or chill. No alarming symptoms, such as weakness, numbness or tingling in his extremities. No loss of control for bowel movement or urinary incontinence.  Currently patient denies running nose, ear pain, headaches, cough, chest pain, SOB, abdominal pain, diarrhea, constipation, dysuria, urgency, frequency, hematuria, skin rashes or leg swelling. No vision change or hearing loss.  In ED, patient was found to have WBC 11.7, temperature normal, no tachycardia, potassium is 3.4, renal function okay. Hemoglobin stable 7.7 on 05/11/14-->8.2. Patient is admitted to inpatient for further evaluation and treatment.  Where does patient live?      At home    Can patient participate in ADLs? Yes      Review of Systems:   General: no fevers, chills, no changes in body weight, normal appetite, has fatigue HEENT: no blurry vision, hearing changes or sore throat Pulm: no dyspnea, coughing, wheezing CV: no chest pain, palpitations, shortness of breath Abd: no nausea, vomiting,abdominal pain, diarrhea, constipation GU: no dysuria, hematuria, polyuria Ext: no leg edema Neuro: no unilateral weakness, numbness, or tingling Skin: no rash MSK: has lower back pain Heme: No easy bruising.  Travel history: No recent long distant  travel.  Allergy:  Allergies  Allergen Reactions  . Levaquin [Levofloxacin] Itching  . Morphine And Related Hives  . Toradol [Ketorolac Tromethamine] Itching  . Tramadol Hives  . Vancomycin Itching  . Zosyn [Piperacillin Sod-Tazobactam So] Itching and Rash    Has taken rocephin in past    Past Medical History  Diagnosis Date  . Sickle cell anemia   . 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 Vitamin D deficiency y-6  . Depression     Past Surgical History  Procedure Laterality Date  . Cholecystectomy      Social History:  reports that he has never smoked. He has never used smokeless tobacco. He reports that he does not drink alcohol or use illicit drugs.  Family History:  Family History  Problem Relation Age of Onset  . Sickle cell trait Mother   . Sickle cell trait Father      Prior to Admission medications   Medication Sig Start Date End Date Taking? Authorizing Provider  folic acid (FOLVITE) 1 MG tablet Take 1 mg by mouth daily.   Yes Historical Provider, MD  hydroxyurea (HYDREA) 500 MG capsule Take 500 mg by mouth daily. May take with food to minimize GI side effects.   Yes Historical Provider, MD  oxyCODONE-acetaminophen (PERCOCET) 10-325 MG per tablet Take 1 tablet by mouth every 4 (four) hours as needed for pain. 05/11/14  Yes Olabunmi A Agboola, MD  aspirin 81 MG chewable tablet Chew 1 tablet (81 mg total) by mouth daily. Patient not taking: Reported on 05/05/2014 04/09/14   Leana Gamer, MD  cefUROXime (CEFTIN)  500 MG tablet Take 1 tablet (500 mg total) by mouth 2 (two) times daily with a meal. Patient not taking: Reported on 05/05/2014 04/09/14   Leana Gamer, MD  cholecalciferol (VITAMIN D) 1000 UNITS tablet Take 2 tablets (2,000 Units total) by mouth daily. Patient not taking: Reported on 05/05/2014 04/09/14   Leana Gamer, MD  diclofenac sodium (VOLTAREN) 1 % GEL Apply 2 g topically 4 (four)  times daily. Patient not taking: Reported on 05/05/2014 04/09/14   Leana Gamer, MD  escitalopram (LEXAPRO) 10 MG tablet Take 1 tablet (10 mg total) by mouth daily. Patient not taking: Reported on 05/05/2014 04/09/14   Leana Gamer, MD  feeding supplement, ENSURE COMPLETE, (ENSURE COMPLETE) LIQD Take 237 mLs by mouth 3 (three) times daily between meals. Patient not taking: Reported on 05/05/2014 04/09/14   Leana Gamer, MD  hydrOXYzine (ATARAX/VISTARIL) 50 MG tablet Take 1 tablet (50 mg total) by mouth at bedtime. Patient not taking: Reported on 05/05/2014 04/09/14   Leana Gamer, MD  ibuprofen (ADVIL,MOTRIN) 800 MG tablet Take 1 tablet (800 mg total) by mouth 3 (three) times daily. Patient not taking: Reported on 05/05/2014 02/12/14   Alexa Sherral Hammers, MD  methocarbamol (ROBAXIN) 500 MG tablet Take 1 tablet (500 mg total) by mouth 2 (two) times daily. Patient not taking: Reported on 03/29/2014 10/06/13   Fransico Meadow, PA-C  polyethylene glycol [facility] / Floria Raveling) packet Take 17 g by mouth daily as needed for mild constipation. Patient not taking: Reported on 05/05/2014 04/09/14   Leana Gamer, MD  senna-docusate (SENOKOT-S) 8.6-50 MG per tablet Take 1 tablet by mouth 2 (two) times daily. Patient not taking: Reported on 05/05/2014 04/09/14   Leana Gamer, MD    Physical Exam: Filed Vitals:   06/30/14 1943 06/30/14 2026 06/30/14 2122 06/30/14 2206  BP: 97/58 124/86 111/61 125/80  Pulse: 84 95 85 94  Temp:      TempSrc:      Resp: 19 14 14 16   SpO2: 94% 95% 100% 98%   General: Not in acute distress HEENT:       Eyes: PERRL, EOMI, no scleral icterus       ENT: No discharge from the ears and nose, no pharynx injection, no tonsillar enlargement.        Neck: No JVD, no bruit, no mass felt. Heme: No neck or axillary lymph node enlargement Cardiac: S1/S2, RRR, No murmurs, No gallops or rubs Pulm: Good air movement bilaterally. Clear to auscultation  bilaterally. No rales, wheezing, rhonchi or rubs. Abd: Soft, nondistended, nontender, no rebound pain, no organomegaly, BS present Ext: No edema bilaterally. 2+DP/PT pulse bilaterally Musculoskeletal: has tenderness over the midline of lower back Skin: No rashes.  Neuro: Alert, oriented X3, cranial nerves II-XII grossly intact, muscle strength 5/5 in all extremities, sensation to light touch intact. Brachial reflex 2+ bilaterally. Knee reflex 1+ bilaterally. Negative Babinski's sign. Normal finger to nose test. Psych: Patient is not psychotic, no suicidal or hemocidal ideation.  Labs on Admission:  Basic Metabolic Panel:  Recent Labs Lab 06/30/14 1735  NA 139  K 3.4*  CL 105  CO2 25  GLUCOSE 106*  BUN <5*  CREATININE 0.59*  CALCIUM 8.9   Liver Function Tests:  Recent Labs Lab 06/30/14 1735  AST 32  ALT 13*  ALKPHOS 83  BILITOT 5.7*  PROT 6.9  ALBUMIN 4.2   No results for input(s): LIPASE, AMYLASE in the last 168 hours. No results  for input(s): AMMONIA in the last 168 hours. CBC:  Recent Labs Lab 06/30/14 1735  WBC 11.7*  NEUTROABS 6.4  HGB 8.2*  HCT 23.0*  MCV 81.6  PLT 443*   Cardiac Enzymes: No results for input(s): CKTOTAL, CKMB, CKMBINDEX, TROPONINI in the last 168 hours.  BNP (last 3 results) No results for input(s): BNP in the last 8760 hours.  ProBNP (last 3 results) No results for input(s): PROBNP in the last 8760 hours.  CBG: No results for input(s): GLUCAP in the last 168 hours.  Radiological Exams on Admission: No results found.  EKG: Not done in ED, will get one.   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 No indication for antibiotics. Hemoglobin stable 7.7 on 05/11/14-->8.2, does not need transfusion at this moment. - Admit to med-surg bed -Start PCA protocol for pain -continue folic acid and hydroxyurea  -Benadryl for itch -IVF: D5-1/2NS at 150 cc/h -Zofran for nausea -Check LDH   Hypokalemia: -Repleted  Major depressive disorder: he was on lexapro, but currently not taking medications at home. Stable. No suicidal or homicidal ideations.  -monitor closely.  Protein-calorie malnutrition, severe -Ensure   Leukocytosis: No signs of infection. No symptoms for respiratory infection, or UTI. -follow-up urinalysis  DVT ppx: SQ Heparin   Code Status: Full code Family Communication: None at bed side.      Disposition Plan: Admit to inpatient   Date of Service 06/30/2014    Ivor Costa [facility] Pager [phone]  If 7PM-7AM, please contact night-coverage [email or web address] Password [email or web address] 06/30/2014, 10:30 PM

## 2014-07-01 ENCOUNTER — Encounter (HOSPITAL_COMMUNITY): Payer: Self-pay | Admitting: *Deleted

## 2014-07-01 LAB — PROTIME-INR
INR: 1.2 (ref 0.00–1.49)
Prothrombin Time: 15.3 seconds — ABNORMAL HIGH (ref 11.6–15.2)

## 2014-07-01 LAB — BASIC METABOLIC PANEL
Anion gap: 9 (ref 5–15)
BUN: <5 mg/dL — ABNORMAL LOW (ref 6–20)
CO2: 24 mmol/L (ref 22–32)
Calcium: 8.6 mg/dL — ABNORMAL LOW (ref 8.9–10.3)
Chloride: 108 mmol/L (ref 101–111)
Creatinine, Ser: 0.33 mg/dL — ABNORMAL LOW (ref 0.61–1.24)
GFR calc Af Amer: >60 mL/min (ref >60)
GFR calc non Af Amer: >60 mL/min (ref >60)
Glucose, Bld: 94 mg/dL (ref 65–99)
Potassium: 3.8 mmol/L (ref 3.5–5.1)
Sodium: 141 mmol/L (ref 135–145)

## 2014-07-01 LAB — URINALYSIS, ROUTINE W REFLEX MICROSCOPIC
Bilirubin Urine: NEGATIVE
Glucose, UA: NEGATIVE mg/dL
Ketones, ur: NEGATIVE mg/dL
Leukocytes, UA: NEGATIVE
Nitrite: NEGATIVE
Protein, ur: NEGATIVE mg/dL
Specific Gravity, Urine: 1.007 (ref 1.005–1.030)
Urobilinogen, UA: 4 mg/dL — ABNORMAL HIGH (ref 0.0–1.0)
pH: 6 (ref 5.0–8.0)

## 2014-07-01 LAB — CBC
HCT: 21.1 % — ABNORMAL LOW (ref 39.0–52.0)
Hemoglobin: 7.3 g/dL — ABNORMAL LOW (ref 13.0–17.0)
MCH: 29.7 pg (ref 26.0–34.0)
MCHC: 34.6 g/dL (ref 30.0–36.0)
MCV: 85.8 fL (ref 78.0–100.0)
Platelets: 396 10*3/uL (ref 150–400)
RBC: 2.46 MIL/uL — ABNORMAL LOW (ref 4.22–5.81)
RDW: 27 % — ABNORMAL HIGH (ref 11.5–15.5)
WBC: 15.2 10*3/uL — ABNORMAL HIGH (ref 4.0–10.5)

## 2014-07-01 LAB — URINE MICROSCOPIC-ADD ON

## 2014-07-01 LAB — LACTATE DEHYDROGENASE: LDH: 309 U/L — ABNORMAL HIGH (ref 98–192)

## 2014-07-01 MED ORDER — HEPARIN SODIUM (PORCINE) 5000 UNIT/ML IJ SOLN
5000.0000 [IU] | Freq: Three times a day (TID) | INTRAMUSCULAR | Status: DC
  Administered 2014-07-01 – 2014-07-04 (×8): 5000 [IU] via SUBCUTANEOUS
  Filled 2014-07-01 (×17): qty 1

## 2014-07-01 MED ORDER — HYDROXYUREA 500 MG PO CAPS
500.0000 mg | ORAL_CAPSULE | Freq: Every day | ORAL | Status: DC
  Administered 2014-07-01 – 2014-07-05 (×5): 500 mg via ORAL
  Filled 2014-07-01 (×6): qty 1

## 2014-07-01 MED ORDER — FOLIC ACID 1 MG PO TABS
1.0000 mg | ORAL_TABLET | Freq: Every day | ORAL | Status: DC
  Administered 2014-07-01 – 2014-07-05 (×5): 1 mg via ORAL
  Filled 2014-07-01 (×5): qty 1

## 2014-07-01 MED ORDER — VITAMIN D3 25 MCG (1000 UNIT) PO TABS
2000.0000 [IU] | ORAL_TABLET | Freq: Every day | ORAL | Status: DC
  Administered 2014-07-01 – 2014-07-05 (×5): 2000 [IU] via ORAL
  Filled 2014-07-01 (×5): qty 2

## 2014-07-01 MED ORDER — SENNOSIDES-DOCUSATE SODIUM 8.6-50 MG PO TABS
1.0000 | ORAL_TABLET | Freq: Every evening | ORAL | Status: DC | PRN
Start: 2014-07-01 — End: 2014-07-05

## 2014-07-01 MED ORDER — ONDANSETRON HCL 4 MG/2ML IJ SOLN: 4.0000 mg | Freq: Three times a day (TID) | INTRAMUSCULAR | Status: DC | PRN

## 2014-07-01 MED ORDER — ONDANSETRON HCL 4 MG/2ML IJ SOLN: 4.0000 mg | Freq: Four times a day (QID) | INTRAMUSCULAR | Status: DC | PRN

## 2014-07-01 MED ORDER — POTASSIUM CHLORIDE CRYS ER 20 MEQ PO TBCR
20.0000 meq | EXTENDED_RELEASE_TABLET | Freq: Once | ORAL | Status: AC
  Administered 2014-07-01: 20 meq via ORAL
  Filled 2014-07-01: qty 1

## 2014-07-01 MED ORDER — ENSURE ENLIVE PO LIQD
237.0000 mL | Freq: Three times a day (TID) | ORAL | Status: DC
  Administered 2014-07-01 – 2014-07-05 (×14): 237 mL via ORAL

## 2014-07-01 MED ORDER — HYDROMORPHONE 0.3 MG/ML IV SOLN
INTRAVENOUS | Status: DC
  Administered 2014-07-01: 2.81 mg via INTRAVENOUS
  Administered 2014-07-01: 1.2 mg via INTRAVENOUS
  Administered 2014-07-02: 1 mg via INTRAVENOUS
  Administered 2014-07-02: 5.5 mg via INTRAVENOUS
  Administered 2014-07-02: 7.5 mg via INTRAVENOUS
  Administered 2014-07-02: 2 mg via INTRAVENOUS
  Administered 2014-07-02: 02:00:00 via INTRAVENOUS
  Administered 2014-07-02: 5.62 mg via INTRAVENOUS
  Administered 2014-07-02: 6.49 mg via INTRAVENOUS
  Administered 2014-07-03: 8.5 mg via INTRAVENOUS
  Administered 2014-07-03: 4.5 mg via INTRAVENOUS
  Administered 2014-07-03: 5.5 mg via INTRAVENOUS
  Administered 2014-07-03: 6.59 mg via INTRAVENOUS
  Administered 2014-07-03: 5.5 mg via INTRAVENOUS
  Administered 2014-07-03: 2 mg via INTRAVENOUS
  Administered 2014-07-04: 14 mg via INTRAVENOUS
  Administered 2014-07-04: 9 mg via INTRAVENOUS
  Administered 2014-07-04: 2.6 mg via INTRAVENOUS
  Administered 2014-07-05: 4 mg via INTRAVENOUS
  Administered 2014-07-05: 3 mg via INTRAVENOUS
  Filled 2014-07-01 (×4): qty 25

## 2014-07-01 MED ORDER — ALUM & MAG HYDROXIDE-SIMETH 200-200-20 MG/5ML PO SUSP: 30.0000 mL | Freq: Four times a day (QID) | ORAL | Status: DC | PRN

## 2014-07-01 MED ORDER — IBUPROFEN 800 MG PO TABS
800.0000 mg | ORAL_TABLET | Freq: Three times a day (TID) | ORAL | Status: DC
  Administered 2014-07-01 – 2014-07-05 (×10): 800 mg via ORAL
  Filled 2014-07-01 (×15): qty 1

## 2014-07-01 MED ORDER — SODIUM CHLORIDE 0.9 % IV SOLN
25.0000 mg | INTRAVENOUS | Status: DC | PRN
  Filled 2014-07-01: qty 0.5

## 2014-07-01 MED ORDER — ZOLPIDEM TARTRATE 5 MG PO TABS
5.0000 mg | ORAL_TABLET | Freq: Once | ORAL | Status: AC
  Administered 2014-07-01: 5 mg via ORAL
  Filled 2014-07-01: qty 1

## 2014-07-01 MED ORDER — HYDROMORPHONE 0.3 MG/ML IV SOLN
INTRAVENOUS | Status: DC
  Administered 2014-07-01: 1.8 mg via INTRAVENOUS
  Administered 2014-07-01: 1.2 mg via INTRAVENOUS
  Administered 2014-07-01: 0.3 mg via INTRAVENOUS
  Administered 2014-07-01: 1.8 mg via INTRAVENOUS
  Filled 2014-07-01: qty 25

## 2014-07-01 MED ORDER — DIPHENHYDRAMINE HCL 25 MG PO CAPS: 25.0000 mg | ORAL_CAPSULE | Freq: Four times a day (QID) | ORAL | Status: DC | PRN

## 2014-07-01 MED ORDER — SODIUM CHLORIDE 0.9 % IJ SOLN: 9.0000 mL | INTRAMUSCULAR | Status: DC | PRN

## 2014-07-01 MED ORDER — NALOXONE HCL 0.4 MG/ML IJ SOLN: 0.4000 mg | INTRAMUSCULAR | Status: DC | PRN

## 2014-07-01 MED ORDER — POLYETHYLENE GLYCOL 3350 17 G PO PACK
17.0000 g | PACK | Freq: Every day | ORAL | Status: DC | PRN
Start: 2014-07-01 — End: 2014-07-05

## 2014-07-01 MED ORDER — POTASSIUM CHLORIDE CRYS ER 20 MEQ PO TBCR
20.0000 meq | EXTENDED_RELEASE_TABLET | Freq: Once | ORAL | Status: DC
  Filled 2014-07-01: qty 1

## 2014-07-01 NOTE — ED Notes (Signed)
Pt oxygen was at 86%, placed patient on 3L, pt o2 now at 93%

## 2014-07-01 NOTE — Progress Notes (Signed)
Pt calms down, on call provider ordered ambien 5mg  ,medicated pt. And will continue to monitor.

## 2014-07-01 NOTE — Progress Notes (Signed)
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 Hb SS with crisis: (There is some question as to whether this patient has HPFH per review of his records from [facility]) Pt having pain in back and legs.He has a   He lists an allergy to Toradol as itching. He has used Toradol here without any difficulty but is refusing at this time. Will substitute Ibuprofen.  Currently his 1 hour lockout does not does not fully accommodate his PCA dose. Will adjust PCA setting to make sure that patient is using his PCa as he has only used 4.8 mgin the last 15 hours 2. Leukocytosis: Pt has a moderate leukocytosis in the absence of any signs of disease. Will monitor. 3. Sickle Cell Disease: Pt reports that he is currently taking Hydrea 500 mg BID. Will adjust dose to home dose. 4. Anemia: Pt has a baseline Hb of 8. His Hb had decrease 1 grams which is expected during crisis. Will continue to monitor. No current indication for a transfusion. 5. Medication non-compliance: Pt reports compliance with Hydrea however it is not reflected in MCV.   Code Status: Full Code Family Communication: N/A Disposition Plan: Not yet ready for discharge  [place].  Pager [phone]. If 7PM-7AM, please contact night-coverage.  07/01/2014, 6:32 PM  LOS: 1 day    Consultants:  None  Procedures:  None  Antibiotics:  None  HPI/Subjective: Pt reports that pain is localized to back and legs. The intensity is 8/10 and he describes pain as throbbing and aching.  Last BM  last night  Objective: Filed Vitals:   07/01/14 1016 07/01/14 1200 07/01/14 1356 07/01/14 1600  BP: 102/55  101/51   Pulse: 91  80   Temp: 99.2 F (37.3 C)  99.6 F (37.6 C)   TempSrc: Oral  Oral   Resp: 18 18 18 20   Height:      Weight:      SpO2: 99% 98% 95% 98%   Weight change:   Intake/Output Summary (Last 24 hours) at 07/01/14 1832 Last data filed at 07/01/14 1530  Gross per 24 hour  Intake 2032.5 ml  Output   1850 ml  Net  182.5 ml    General: Alert, awake, oriented x3, in no acute distress.  HEENT: [place]/AT PEERL, EOMI, anicteric OROPHARYNX:  Moist, No exudate/ erythema/lesions.  Heart: Regular rate and rhythm, without murmurs, rubs, gallops, PMI non-displaced, no heaves or thrills on palpation.  Lungs: Clear to auscultation, no wheezing or rhonchi noted and decreased air entry at bases. Mild increase in vocal fremitus and dulness to percussion noted at bases. Abdomen: Soft, nontender, nondistended, positive bowel sounds, no masses no hepatosplenomegaly noted.  Neuro: No focal neurological deficits noted cranial nerves II through XII grossly intact.  Strength unable to assess secondary to pain. Musculoskeletal: No warm swelling or erythema around joints, no spinal tenderness noted. Psychiatric: Patient alert and oriented x3,his affect is normal. Lymph node survey: No cervical axillary or inguinal lymphadenopathy noted.   Data Reviewed: Basic Metabolic Panel:  Recent Labs Lab 06/30/14 1735 07/01/14 0820  NA 139 141  K  3.4* 3.8  CL 105 108  CO2 25 24  GLUCOSE 106* 94  BUN <5* <5*  CREATININE 0.59* 0.33*  CALCIUM 8.9 8.6*   Liver Function Tests:  Recent Labs Lab 06/30/14 1735  AST 32  ALT 13*  ALKPHOS 83  BILITOT 5.7*  PROT 6.9  ALBUMIN 4.2   No results for input(s): LIPASE, AMYLASE in the last 168 hours. No results for input(s): AMMONIA in the last 168 hours. CBC:  Recent Labs Lab 06/30/14 1735 07/01/14 0820  WBC 11.7* 15.2*  NEUTROABS 6.4   --   HGB 8.2* 7.3*  HCT 23.0* 21.1*  MCV 81.6 85.8  PLT 443* 396   Cardiac Enzymes: No results for input(s): CKTOTAL, CKMB, CKMBINDEX, TROPONINI in the last 168 hours. BNP (last 3 results) No results for input(s): BNP in the last 8760 hours.  ProBNP (last 3 results) No results for input(s): PROBNP in the last 8760 hours.  CBG: No results for input(s): GLUCAP in the last 168 hours.  No results found for this or any previous visit (from the past 240 hour(s)).   Studies: No results found.  Scheduled Meds: . cholecalciferol  2,000 Units Oral Daily  . feeding supplement (ENSURE ENLIVE)  237 mL Oral TID BM  . folic acid  1 mg Oral Daily  . heparin  5,000 Units Subcutaneous 3 times per day  . HYDROmorphone PCA 0.3 mg/mL   Intravenous 6 times per day  . hydroxyurea  500 mg Oral Daily  . ibuprofen  800 mg Oral TID  . potassium chloride  20 mEq Oral Once   Continuous Infusions: . dextrose 5 % and 0.45% NaCl 150 mL/hr at 07/01/14 0611    Total time spent 40 minutes.

## 2014-07-01 NOTE — Progress Notes (Signed)
Patient called and said something was wrong with his IV. On assessment nurse saw that the the PCA tubing was disconnected from the infusing IV tubing and only the 1/2 NS was going in .Nurse explained to patient problem with the IV and tried to reconnect.  But patient refused and said it was fine and not to attempt to fix it.  He wanted the charge nurse to come in and tell him that it was not connected correctly.  After charge nurse went in the room and tried to explain the situation to him again he was still upset, he didn't believe that there was anything wrong  With the connection.  He said he wanted to use the bathroom before it fixed.  After he was given some a time, he still was and cursing.  He finally let the charge nurse fix the IV properly and then he  said he didn't want anyone to touch him or do anything for him.  He wanted his door closed.  AC was called and notified of this.

## 2014-07-01 NOTE — Progress Notes (Signed)
Pt.gets emotional and started crying ,asking something to help him sleep. Page on call provider

## 2014-07-02 LAB — CBC WITH DIFFERENTIAL/PLATELET
Basophils Absolute: 0 10*3/uL (ref 0.0–0.1)
Basophils Relative: 0 % (ref 0–1)
Eosinophils Absolute: 0.3 10*3/uL (ref 0.0–0.7)
Eosinophils Relative: 2 % (ref 0–5)
HCT: 19.6 % — ABNORMAL LOW (ref 39.0–52.0)
Hemoglobin: 6.9 g/dL — CL (ref 13.0–17.0)
Lymphocytes Relative: 29 % (ref 12–46)
Lymphs Abs: 4 10*3/uL (ref 0.7–4.0)
MCH: 30.1 pg (ref 26.0–34.0)
MCHC: 35.2 g/dL (ref 30.0–36.0)
MCV: 85.6 fL (ref 78.0–100.0)
Monocytes Absolute: 1 10*3/uL (ref 0.1–1.0)
Monocytes Relative: 7 % (ref 3–12)
Neutro Abs: 8.4 10*3/uL — ABNORMAL HIGH (ref 1.7–7.7)
Neutrophils Relative %: 62 % (ref 43–77)
Platelets: 422 10*3/uL — ABNORMAL HIGH (ref 150–400)
RBC: 2.29 MIL/uL — ABNORMAL LOW (ref 4.22–5.81)
RDW: 27.5 % — ABNORMAL HIGH (ref 11.5–15.5)
WBC: 13.7 10*3/uL — ABNORMAL HIGH (ref 4.0–10.5)
nRBC: 7 /100 WBC — ABNORMAL HIGH

## 2014-07-02 LAB — BASIC METABOLIC PANEL
Anion gap: 9 (ref 5–15)
BUN: 8 mg/dL (ref 6–20)
CO2: 28 mmol/L (ref 22–32)
Calcium: 8.9 mg/dL (ref 8.9–10.3)
Chloride: 104 mmol/L (ref 101–111)
Creatinine, Ser: 0.36 mg/dL — ABNORMAL LOW (ref 0.61–1.24)
GFR calc Af Amer: >60 mL/min (ref >60)
GFR calc non Af Amer: >60 mL/min (ref >60)
Glucose, Bld: 104 mg/dL — ABNORMAL HIGH (ref 65–99)
Potassium: 3.3 mmol/L — ABNORMAL LOW (ref 3.5–5.1)
Sodium: 141 mmol/L (ref 135–145)

## 2014-07-02 LAB — MAGNESIUM: Magnesium: 1.9 mg/dL (ref 1.7–2.4)

## 2014-07-02 LAB — LACTATE DEHYDROGENASE: LDH: 285 U/L — ABNORMAL HIGH (ref 98–192)

## 2014-07-02 MED ORDER — POTASSIUM CHLORIDE CRYS ER 20 MEQ PO TBCR
40.0000 meq | EXTENDED_RELEASE_TABLET | ORAL | Status: AC
  Administered 2014-07-02: 40 meq via ORAL
  Filled 2014-07-02 (×2): qty 2

## 2014-07-02 MED ORDER — HYDROMORPHONE HCL 1 MG/ML IJ SOLN
0.5000 mg | Freq: Once | INTRAMUSCULAR | Status: AC
  Administered 2014-07-02: 0.5 mg via INTRAVENOUS
  Filled 2014-07-02: qty 1

## 2014-07-02 MED ORDER — POTASSIUM CHLORIDE CRYS ER 20 MEQ PO TBCR
40.0000 meq | EXTENDED_RELEASE_TABLET | Freq: Once | ORAL | Status: AC
  Administered 2014-07-02: 40 meq via ORAL

## 2014-07-02 NOTE — Plan of Care (Signed)
Problem: Phase I Progression Outcomes Goal: Pain controlled with appropriate interventions Outcome: Progressing Pt using PCA Dilaudid. When asked about his pain level, he said, "It's working."  When RN asked for a pain scale number two times (in 2 different ways), he replied, "You're asking me too many questions; I just want to rest before my girlfriend gets here."

## 2014-07-02 NOTE — Progress Notes (Signed)
Initial Nutrition Assessment  DOCUMENTATION CODES:  Underweight  INTERVENTION: - Ensure Enlive po TID, each supplement provides 350 kcal and 20 grams of protein - Snacks BID between meals.  - Will change to order with assist with instructions to provide house tray if pt does not order meal.  - RD will continue to monitor.   NUTRITION DIAGNOSIS:  Inadequate oral intake related to  (poor appetite) as evidenced by meal completion < 25%.  GOAL:  Weight gain  MONITOR:  PO intake, Supplement acceptance, Labs, Weight trends  REASON FOR ASSESSMENT:  Consult Assessment of nutrition requirement/status  ASSESSMENT: 24 y.o. male with PMH of sickle cell disease, depression, osteo necrosis, who presents with lower back pain  - Pt reports that he is eating well and drinking Ensure supplements. Per RN, pt is not drinking supplements. Reviewed meal order history and pt has not placed a meal order since admission.  - Pt talking to girlfriend who is not present in room during RD visit - RD to change pt to meal order with assist and recommend bringing house tray if pt does not place order.  - Pt with history of agitation. Nutrition-focused physical exam not completed. Pt with 9% wt loss in the past three months (significant for time frame.) Suspect some form of malnutrition.  - RD will continue to monitor.  - Labs and medications reviewed  K 3.4  Height:  Ht Readings from Last 1 Encounters:  07/01/14 6\' 2"  (1.88 m)    Weight:  Wt Readings from Last 1 Encounters:  07/01/14 123 lb 3.2 oz (55.883 kg)    Ideal Body Weight:  86.4 kg  Wt Readings from Last 10 Encounters:  07/01/14 123 lb 3.2 oz (55.883 kg)  05/10/14 126 lb 1.7 oz (57.2 kg)  03/30/14 135 lb (61.236 kg)  04/01/14 135 lb (61.236 kg)  03/29/14 130 lb (58.968 kg)  02/07/14 135 lb (61.236 kg)  10/06/13 135 lb (61.236 kg)  09/02/13 135 lb (61.236 kg)  04/24/13 125 lb (56.7 kg)  03/30/13 135 lb (61.236 kg)    BMI:   Body mass index is 15.81 kg/(m^2).  Estimated Nutritional Needs:  Kcal:  1700-1900  Protein:  100-110 g  Fluid:  1.9 L/day  Skin:   WNL  Diet Order:  Diet regular Room service appropriate?: Yes; Fluid consistency:: Thin  EDUCATION NEEDS:  Education needs addressed   Intake/Output Summary (Last 24 hours) at 07/02/14 1453 Last data filed at 07/02/14 1400  Gross per 24 hour  Intake 2306.25 ml  Output   3475 ml  Net -1168.75 ml    Last BM:  Prior to admission  Laurette Schimke [place], [place], [place]

## 2014-07-02 NOTE — Progress Notes (Signed)
Patient requesting pain shot in addition to PCA . K Kirby notified and order received for Dilaudid  IV once.

## 2014-07-02 NOTE — Progress Notes (Signed)
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 Hb SS with crisis: Events of last night noted. Pt has used 14.5 mg with 116/32:demands/deliveries. I will continue PCA at current setings and encourage appropriate use of the PCA. Continue Ibuprofen.  2. Leukocytosis: Leukocytosis improving in the absence of any signs of disease. Will monitor. 3. Sickle Cell Disease: Continue Hydrea 500 mg BID.  4. Anemia: Pt has a baseline Hb of 8. His Hb had decrease 1 grams which is expected during crisis. Will continue to monitor. No current indication for a transfusion. 5. Medication non-compliance: Pt reports compliance with Hydrea however it is not reflected in MCV.   Code Status: Full Code Family Communication: N/A Disposition Plan: Not yet ready for discharge  [place].  Pager [phone]. If 7PM-7AM, please contact night-coverage.  07/02/2014, 12:54 PM  LOS: 2 days    Consultants:  None  Procedures:  None  Antibiotics:  None  HPI/Subjective: Pt reports that pain is localized to back and legs. He reports t\he intensity as high and he describes pain as throbbing and aching. Encouraged ambulation today  Last BM last night  Objective: Filed Vitals:   07/02/14 0347 07/02/14 0544 07/02/14 0916 07/02/14 1010  BP:  109/53  122/45  Pulse:  76  74  Temp:  98.6 F (37 C)  98.9 F (37.2 C)  TempSrc:  Oral  Oral  Resp: 14 16 11 12   Height:      Weight:      SpO2: 97% 98% 100% 100%   Weight change:   Intake/Output Summary (Last 24  hours) at 07/02/14 1254 Last data filed at 07/02/14 1000  Gross per 24 hour  Intake 2366.25 ml  Output   3025 ml  Net -658.75 ml    General: Alert, awake, oriented x3, comfortable appearing today.  HEENT: [place]/AT PEERL, EOMI, anicteric OROPHARYNX:  Moist, No exudate/ erythema/lesions.  Heart: Regular rate and rhythm, without murmurs, rubs, gallops, PMI non-displaced, no heaves or thrills on palpation.  Lungs: Clear to auscultation, no wheezing or rhonchi noted and decreased air entry at bases. Mild increase in vocal fremitus and dulness to percussion noted at bases. Abdomen: Soft, nontender, nondistended, positive bowel sounds, no masses no hepatosplenomegaly noted.  Neuro: No focal neurological deficits noted cranial nerves II through XII grossly intact.  patient moving well in the bed and has been getting up to the bathroom without difficulty. Musculoskeletal: No warm swelling or erythema around joints, no spinal tenderness noted. Psychiatric: Patient alert and oriented x3,his affect is normal.    Data Reviewed: Basic Metabolic Panel:  Recent Labs Lab 06/30/14 1735 07/01/14 0820 07/02/14 0500  NA 139 141 141  K 3.4* 3.8 3.3*  CL 105 108 104  CO2 25 24 28   GLUCOSE 106* 94 104*  BUN <5* <5* 8  CREATININE 0.59* 0.33* 0.36*  CALCIUM 8.9 8.6* 8.9  MG  --   --  1.9   Liver Function Tests:  Recent Labs Lab 06/30/14 1735  AST 32  ALT 13*  ALKPHOS 83  BILITOT 5.7*  PROT 6.9  ALBUMIN 4.2  No results for input(s): LIPASE, AMYLASE in the last 168 hours. No results for input(s): AMMONIA in the last 168 hours. CBC:  Recent Labs Lab 06/30/14 1735 07/01/14 0820 07/02/14 0500  WBC 11.7* 15.2* 13.7*  NEUTROABS 6.4  --  8.4*  HGB 8.2* 7.3* 6.9*  HCT 23.0* 21.1* 19.6*  MCV 81.6 85.8 85.6  PLT 443* 396 422*   Cardiac Enzymes: No results for input(s): CKTOTAL, CKMB, CKMBINDEX, TROPONINI in the last 168 hours. BNP (last 3 results) No results for input(s): BNP in the  last 8760 hours.  ProBNP (last 3 results) No results for input(s): PROBNP in the last 8760 hours.  CBG: No results for input(s): GLUCAP in the last 168 hours.  No results found for this or any previous visit (from the past 240 hour(s)).   Studies: No results found.  Scheduled Meds: . cholecalciferol  2,000 Units Oral Daily  . feeding supplement (ENSURE ENLIVE)  237 mL Oral TID BM  . folic acid  1 mg Oral Daily  . heparin  5,000 Units Subcutaneous 3 times per day  . HYDROmorphone PCA 0.3 mg/mL   Intravenous 6 times per day  . hydroxyurea  500 mg Oral Daily  . ibuprofen  800 mg Oral TID  . potassium chloride  20 mEq Oral Once  . potassium chloride  40 mEq Oral Q2H   Continuous Infusions: . dextrose 5 % and 0.45% NaCl 75 mL/hr (07/02/14 0028)    Total time spent 35 minutes.

## 2014-07-02 NOTE — Progress Notes (Signed)
Critical lab called for Timothy Marks - Hemoglobin 6.9 this morning.  Notified on Physician.  Thanks  [name]

## 2014-07-03 LAB — BASIC METABOLIC PANEL
Anion gap: 10 (ref 5–15)
BUN: 9 mg/dL (ref 6–20)
CO2: 27 mmol/L (ref 22–32)
Calcium: 9.1 mg/dL (ref 8.9–10.3)
Chloride: 103 mmol/L (ref 101–111)
Creatinine, Ser: 0.45 mg/dL — ABNORMAL LOW (ref 0.61–1.24)
GFR calc Af Amer: >60 mL/min (ref >60)
GFR calc non Af Amer: >60 mL/min (ref >60)
Glucose, Bld: 98 mg/dL (ref 65–99)
Potassium: 4.7 mmol/L (ref 3.5–5.1)
Sodium: 140 mmol/L (ref 135–145)

## 2014-07-03 LAB — CBC WITH DIFFERENTIAL/PLATELET
Basophils Absolute: 0 10*3/uL (ref 0.0–0.1)
Basophils Relative: 0 % (ref 0–1)
Eosinophils Absolute: 0.4 10*3/uL (ref 0.0–0.7)
Eosinophils Relative: 3 % (ref 0–5)
HCT: 22 % — ABNORMAL LOW (ref 39.0–52.0)
Hemoglobin: 7.3 g/dL — ABNORMAL LOW (ref 13.0–17.0)
Lymphocytes Relative: 23 % (ref 12–46)
Lymphs Abs: 2.9 10*3/uL (ref 0.7–4.0)
MCH: 29.4 pg (ref 26.0–34.0)
MCHC: 33.2 g/dL (ref 30.0–36.0)
MCV: 88.7 fL (ref 78.0–100.0)
Monocytes Absolute: 0.9 10*3/uL (ref 0.1–1.0)
Monocytes Relative: 7 % (ref 3–12)
Neutro Abs: 8.3 10*3/uL — ABNORMAL HIGH (ref 1.7–7.7)
Neutrophils Relative %: 67 % (ref 43–77)
Platelets: 476 10*3/uL — ABNORMAL HIGH (ref 150–400)
RBC: 2.48 MIL/uL — ABNORMAL LOW (ref 4.22–5.81)
RDW: 25.9 % — ABNORMAL HIGH (ref 11.5–15.5)
WBC: 12.5 10*3/uL — ABNORMAL HIGH (ref 4.0–10.5)

## 2014-07-03 MED ORDER — HYDROMORPHONE HCL 1 MG/ML IJ SOLN
0.5000 mg | Freq: Once | INTRAMUSCULAR | Status: AC
  Administered 2014-07-03: 0.5 mg via INTRAVENOUS
  Filled 2014-07-03: qty 1

## 2014-07-03 NOTE — Progress Notes (Signed)
Notified Dr. Jonelle Sidle via phone per pt request to discuss changes with pain regimen. Resting quietly with reported pain levels between 9-10. Reassurance given. No new orders received. MD to round soon.

## 2014-07-03 NOTE — Plan of Care (Signed)
Problem: Phase I Progression Outcomes Goal: Pulmonary Hygiene as Indicated (Sickle Cell) Outcome: Progressing O2 sats on RA 100%. Pt has refused IS use. Continued instruction and encouragement to use IS.

## 2014-07-04 LAB — BASIC METABOLIC PANEL
Anion gap: 11 (ref 5–15)
BUN: 16 mg/dL (ref 6–20)
CO2: 28 mmol/L (ref 22–32)
Calcium: 9.2 mg/dL (ref 8.9–10.3)
Chloride: 99 mmol/L — ABNORMAL LOW (ref 101–111)
Creatinine, Ser: 0.58 mg/dL — ABNORMAL LOW (ref 0.61–1.24)
GFR calc Af Amer: >60 mL/min (ref >60)
GFR calc non Af Amer: >60 mL/min (ref >60)
Glucose, Bld: 109 mg/dL — ABNORMAL HIGH (ref 65–99)
Potassium: 4.5 mmol/L (ref 3.5–5.1)
Sodium: 138 mmol/L (ref 135–145)

## 2014-07-04 LAB — CBC WITH DIFFERENTIAL/PLATELET
Basophils Absolute: 0 10*3/uL (ref 0.0–0.1)
Basophils Relative: 0 % (ref 0–1)
Eosinophils Absolute: 0.5 10*3/uL (ref 0.0–0.7)
Eosinophils Relative: 3 % (ref 0–5)
HCT: 22.6 % — ABNORMAL LOW (ref 39.0–52.0)
Hemoglobin: 7.6 g/dL — ABNORMAL LOW (ref 13.0–17.0)
Lymphocytes Relative: 12 % (ref 12–46)
Lymphs Abs: 1.8 10*3/uL (ref 0.7–4.0)
MCH: 29.8 pg (ref 26.0–34.0)
MCHC: 33.6 g/dL (ref 30.0–36.0)
MCV: 88.6 fL (ref 78.0–100.0)
Monocytes Absolute: 1.1 10*3/uL — ABNORMAL HIGH (ref 0.1–1.0)
Monocytes Relative: 7 % (ref 3–12)
Neutro Abs: 11.6 10*3/uL — ABNORMAL HIGH (ref 1.7–7.7)
Neutrophils Relative %: 78 % — ABNORMAL HIGH (ref 43–77)
Platelets: 454 10*3/uL — ABNORMAL HIGH (ref 150–400)
RBC: 2.55 MIL/uL — ABNORMAL LOW (ref 4.22–5.81)
RDW: 22.6 % — ABNORMAL HIGH (ref 11.5–15.5)
WBC: 15 10*3/uL — ABNORMAL HIGH (ref 4.0–10.5)
nRBC: 21 /100 WBC — ABNORMAL HIGH

## 2014-07-04 LAB — RETICULOCYTES
RBC.: 2.55 MIL/uL — ABNORMAL LOW (ref 4.22–5.81)
Retic Count, Absolute: 517.7 10*3/uL — ABNORMAL HIGH (ref 19.0–186.0)
Retic Ct Pct: 20.3 % — ABNORMAL HIGH (ref 0.4–3.1)

## 2014-07-04 MED ORDER — ZOLPIDEM TARTRATE 5 MG PO TABS: 5.0000 mg | ORAL_TABLET | Freq: Once | ORAL | Status: DC

## 2014-07-04 MED ORDER — HYDROMORPHONE HCL 1 MG/ML IJ SOLN
0.5000 mg | INTRAMUSCULAR | Status: AC | PRN
  Administered 2014-07-04 (×2): 0.5 mg via INTRAVENOUS
  Filled 2014-07-04: qty 1

## 2014-07-04 NOTE — Progress Notes (Signed)
SICKLE CELL SERVICE PROGRESS NOTE  Timothy Marks [id number]:[id number] DOB: 07-26-1990 DOA: 06/30/2014 PCP: Ricke Hey, MD   Consultants:  none  Procedures:  none  Antibiotics:  none  HPI/Subjective: Pt states that he is having pain mainly in his back. He rates pain as an 8/10. He is getting up to go to the bathroom and is eating and drinking well. His last bowel movement was 2 days ago. He feels that his PCA is helping his pain.    Objective: Filed Vitals:   07/04/14 0800 07/04/14 0958 07/04/14 1200 07/04/14 1349  BP:  105/51  110/43  Pulse:  71  62  Temp:  98.4 F (36.9 C)  98.5 F (36.9 C)  TempSrc:  Oral  Oral  Resp: 18 16 13 12   Height:      Weight:      SpO2: 100% 100% 100% 100%   Weight change:   Intake/Output Summary (Last 24 hours) at 07/04/14 1451 Last data filed at 07/04/14 1000  Gross per 24 hour  Intake    360 ml  Output   1150 ml  Net   -790 ml    General: NAD, laying in bed HEENT: [place]/AT PEERL, EOMI OROPHARYNX:  Moist, No exudate/ erythema/lesions.  Heart: Regular rate and rhythm, without murmurs, rubs, gallops. Lungs: Clear to auscultation, no wheezing or rhonchi noted.  Abdomen: Soft, nontender, nondistended, positive bowel sounds, no masses no hepatosplenomegaly noted. Neuro: No focal neurological deficits noted cranial nerves II through XII grossly intact.  Musculoskeletal: No warmth, swelling or erythema around joints Extremities: no cyanosis or edema, tender lower extremities.  Data Reviewed: Basic Metabolic Panel:  Recent Labs Lab 06/30/14 1735 07/01/14 0820 07/02/14 0500 07/03/14 0816 07/04/14 0939  NA 139 141 141 140 138  K 3.4* 3.8 3.3* 4.7 4.5  CL 105 108 104 103 99*  CO2 25 24 28 27 28   GLUCOSE 106* 94 104* 98 109*  BUN <5* <5* 8 9 16   CREATININE 0.59* 0.33* 0.36* 0.45* 0.58*  CALCIUM 8.9 8.6* 8.9 9.1 9.2  MG  --   --  1.9  --   --    Liver Function Tests:  Recent Labs Lab 06/30/14 1735  AST 32  ALT 13*   ALKPHOS 83  BILITOT 5.7*  PROT 6.9  ALBUMIN 4.2   No results for input(s): LIPASE, AMYLASE in the last 168 hours. No results for input(s): AMMONIA in the last 168 hours. CBC:  Recent Labs Lab 06/30/14 1735 07/01/14 0820 07/02/14 0500 07/03/14 0816 07/04/14 0939  WBC 11.7* 15.2* 13.7* 12.5* 15.0*  NEUTROABS 6.4  --  8.4* 8.3* 11.6*  HGB 8.2* 7.3* 6.9* 7.3* 7.6*  HCT 23.0* 21.1* 19.6* 22.0* 22.6*  MCV 81.6 85.8 85.6 88.7 88.6  PLT 443* 396 422* 476* 454*   Cardiac Enzymes: No results for input(s): CKTOTAL, CKMB, CKMBINDEX, TROPONINI in the last 168 hours. BNP (last 3 results) No results for input(s): BNP in the last 8760 hours.  ProBNP (last 3 results) No results for input(s): PROBNP in the last 8760 hours.  CBG: No results for input(s): GLUCAP in the last 168 hours.  No results found for this or any previous visit (from the past 240 hour(s)).   Studies: No results found.  Scheduled Meds: . cholecalciferol  2,000 Units Oral Daily  . feeding supplement (ENSURE ENLIVE)  237 mL Oral TID BM  . folic acid  1 mg Oral Daily  . heparin  5,000 Units Subcutaneous 3 times per day  .  HYDROmorphone PCA 0.3 mg/mL   Intravenous 6 times per day  . hydroxyurea  500 mg Oral Daily  . ibuprofen  800 mg Oral TID  . potassium chloride  20 mEq Oral Once  . 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 Pt's pain is an 8/10. He used 32 mg of Dilaudid overnight. Will continue Dilaudid PCA at current dose. Continue ibuprofen for anti-inflammation.  2) Anemia: Hgb  Increasing as it is now 7.6 from 7.3. His baseline is ~8-9. Will continue to monitor. Transfuse if hgb<6 or symptomatic.   3)Sickle Cell Care: Continue Folic acid and Hydrea.  4)Leukocytosis: WBC of 15. Likely reflective of crisis. No signs of infection. Continue to monitor.  5) FEN/GI : Regular Diet, continue vit D IV fluids-KVO Bowel regimen in place   Code Status: Full  DVT Prophylaxis: heparin [place] Family Communication: none  Disposition Plan: pending further improvement  Time spent: 35 min  Kalman Shan  Pager [phone]. If 7PM-7AM, please contact night-coverage.  07/04/2014, 2:51 PM  LOS: 4 days   Kalman Shan

## 2014-07-04 NOTE — Progress Notes (Signed)
Subjective: A 24 yo man admitted with sickle cell painful crisis. Patient has been on Dilaudid PCA but has been non-compliant. He has been taking his nose piece off hence pausing the PCA. He is now complaining of pain at 10/10 and frustrated voicing that he wants to die but not intending to harm himself. He has used 34 mg of the Dilaudid in the last 24 hours with 66 demands and 64 deliveries. No fever, no cough, No SOB. Patient has been difficult to deal with with outbursts.  Objective: Vital signs in last 24 hours: Temp:  [97.5 F (36.4 C)-98.5 F (36.9 C)] 97.5 F (36.4 C) (05/19 0524) Pulse Rate:  [58-72] 58 (05/19 0524) Resp:  [11-14] 12 (05/19 0524) BP: (108-120)/(40-60) 111/52 mmHg (05/19 0524) SpO2:  [94 %-100 %] 100 % (05/19 0524) Weight change:     Intake/Output from previous day: 05/18 0701 - 05/19 0700 In: 360 [P.O.:360] Out: 2550 [Urine:2550] Intake/Output this shift:    General appearance: alert, combative and no distress Head: Normocephalic, without obvious abnormality, atraumatic Eyes: conjunctivae/corneas clear. PERRL, EOM's intact. Fundi benign. Neck: no adenopathy, no carotid bruit, no JVD, supple, symmetrical, trachea midline and thyroid not enlarged, symmetric, no tenderness/mass/nodules Back: symmetric, no curvature. ROM normal. No CVA tenderness. Resp: clear to auscultation bilaterally Chest wall: no tenderness Cardio: regular rate and rhythm, S1, S2 normal, no murmur, click, rub or gallop GI: soft, non-tender; bowel sounds normal; no masses,  no organomegaly Extremities: extremities normal, atraumatic, no cyanosis or edema Pulses: 2+ and symmetric Skin: Skin color, texture, turgor normal. No rashes or lesions Lymph nodes: Cervical, supraclavicular, and axillary nodes normal. Neurologic: Grossly normal  Lab Results:  Recent Labs  07/02/14 0500 07/03/14 0816  WBC 13.7* 12.5*  HGB 6.9* 7.3*  HCT 19.6* 22.0*  PLT 422* 476*   BMET  Recent Labs  07/02/14 0500 07/03/14 0816  NA 141 140  K 3.3* 4.7  CL 104 103  CO2 28 27  GLUCOSE 104* 98  BUN 8 9  CREATININE 0.36* 0.45*  CALCIUM 8.9 9.1    Studies/Results: No results found.  Medications: I have reviewed the patient's current medications.  Assessment/Plan: A 24 yo admitted with sickle cell painful crisis.  #1 Sickle cell Painful crisis: Patient is not using his PCA as scheduled due to non-compliance with safety measures. I have had a lengthy discussion with patient about the need for him to use it and if he uses it correctly and still has symptoms, we will make changes.  #2. Sickle Cell Anemia: H/H so far stable but we will follow closely.  #3 Hypokalemia: Repleted.  #4. Major Depressive Disorder: patient is not suicidal actively but voiced frustration with life. He has been seen by Psychiatry before but refuses medications. He will need counseling and psychiatric follow up at DC. He does not require suicide precaution or IVC now.   LOS: 4 days   GARBA,LAWAL 07/03/2014, 04:25 PM

## 2014-07-05 LAB — CBC WITH DIFFERENTIAL/PLATELET
Basophils Absolute: 0 10*3/uL (ref 0.0–0.1)
Basophils Relative: 0 % (ref 0–1)
Eosinophils Absolute: 0.2 10*3/uL (ref 0.0–0.7)
Eosinophils Relative: 2 % (ref 0–5)
HCT: 23.4 % — ABNORMAL LOW (ref 39.0–52.0)
Hemoglobin: 7.8 g/dL — ABNORMAL LOW (ref 13.0–17.0)
Lymphocytes Relative: 37 % (ref 12–46)
Lymphs Abs: 3.5 10*3/uL (ref 0.7–4.0)
MCH: 29.5 pg (ref 26.0–34.0)
MCHC: 33.3 g/dL (ref 30.0–36.0)
MCV: 88.6 fL (ref 78.0–100.0)
Monocytes Absolute: 0.4 10*3/uL (ref 0.1–1.0)
Monocytes Relative: 4 % (ref 3–12)
Neutro Abs: 5.3 10*3/uL (ref 1.7–7.7)
Neutrophils Relative %: 57 % (ref 43–77)
Platelets: 391 10*3/uL (ref 150–400)
RBC: 2.64 MIL/uL — ABNORMAL LOW (ref 4.22–5.81)
RDW: 22 % — ABNORMAL HIGH (ref 11.5–15.5)
WBC: 9.4 10*3/uL (ref 4.0–10.5)
nRBC: 13 /100 WBC — ABNORMAL HIGH

## 2014-07-05 MED ORDER — OXYCODONE-ACETAMINOPHEN 5-325 MG PO TABS
1.0000 | ORAL_TABLET | ORAL | Status: DC
  Administered 2014-07-05 (×3): 1 via ORAL
  Filled 2014-07-05 (×3): qty 1

## 2014-07-05 MED ORDER — OXYCODONE HCL 5 MG PO TABS
5.0000 mg | ORAL_TABLET | ORAL | Status: DC
  Administered 2014-07-05 (×3): 5 mg via ORAL
  Filled 2014-07-05 (×3): qty 1

## 2014-07-05 NOTE — Discharge Summary (Signed)
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 male with PMH of sickle cess disease, depression, osteo necrosis, who presents with lower back pain  Patient reports that he started having lower back pain in morning, which has been progressively getting worse. Pt denies any falls or injuries. He states that his pain feels similar to his previous sickle cell pain. He states that he typically experiences sickle cell pain in his lower back. Pt denies any hx of spinal infections. No fever or chill. No alarming symptoms, such as weakness, numbness or tingling in his extremities. No loss of control for bowel movement or urinary incontinence.  Currently patient denies running nose, ear pain, headaches, cough, chest pain, SOB, abdominal pain, diarrhea, constipation, dysuria, urgency, frequency, hematuria, skin rashes or leg swelling. No vision change or hearing loss.  Hospital Course by Problem:  Sickle Cell Crisis: Patient presented with pain characteristic of acute vaso-occlusive crisis.Pt's pain was treated with bolus IV analgesics initially and was later transitioned with a Dilaudid PCA and ketorolac. PCA was decreased as pain improved and he  was started on his home dose of Percocet. His pain was well controlled with his oral home regimen without much need for PCA. He had overall improvement of his pain and was physically functional upon discharge.  Hypokalemia: His potassium required repletion.  Discharge Exam: Filed Vitals:   07/05/14 0630  BP: 112/63  Pulse: 64  Temp: 98.3 F (36.8 C)  Resp: 14   Filed Vitals:   07/04/14 2200 07/05/14 0000 07/05/14 0400 07/05/14 0630  BP: 114/54   112/63  Pulse: 58   64  Temp: 98.5 F (36.9 C)   98.3 F (36.8 C)  TempSrc: Oral   Oral  Resp: 12 14 13 14   Height:      Weight:      SpO2: 97% 100% 100% 100%   General: Alert, awake, oriented x3, in no acute distress.  HEENT: [place]/AT PEERL, EOMI Neck: Trachea midline,  no masses or lymphadenopathy OROPHARYNX:  Moist, No exudate/ erythema/lesions.  Heart: Regular rate and rhythm, without murmurs, rubs, gallops Lungs: Clear to auscultation, no wheezing or rhonchi noted. No increased vocal fremitus resonant to percussion  Abdomen: Soft, nontender, nondistended, positive bowel sounds, no masses no hepatosplenomegaly noted..  Neuro: No focal neurological deficits noted cranial nerves II through XII grossly intact. Strength 5 out of 5 in bilateral upper and lower extremities. Musculoskeletal: No warm swelling or erythema around joints, no spinal tenderness noted. Psychiatric: Patient alert and oriented x3, good insight and cognition Lymph node survey: No cervical axillary or inguinal lymphadenopathy noted. Extremities: no cyanosis, or edema  Discharge Instructions    Medication List    STOP taking these medications        cefUROXime  500 MG tablet  Commonly known as:  CEFTIN      TAKE these medications        aspirin 81 MG chewable tablet  Chew 1 tablet (81 mg total) by mouth daily.     cholecalciferol 1000 UNITS tablet  Commonly known as:  VITAMIN D  Take 2 tablets (2,000 Units total) by mouth daily.     diclofenac sodium 1  % Gel  Commonly known as:  VOLTAREN  Apply 2 g topically 4 (four) times daily.     escitalopram 10 MG tablet  Commonly known as:  LEXAPRO  Take 1 tablet (10 mg total) by mouth daily.     feeding supplement (ENSURE COMPLETE) Liqd  Take 237 mLs by mouth 3 (three) times daily between meals.     folic acid 1 MG tablet  Commonly known as:  FOLVITE  Take 1 mg by mouth daily.     hydroxyurea 500 MG capsule  Commonly known as:  HYDREA  Take 500 mg by mouth 2 (two) times daily. May take with food to minimize GI side effects.     hydrOXYzine 50 MG tablet  Commonly known as:  ATARAX/VISTARIL  Take 1 tablet (50 mg total) by mouth at bedtime.     ibuprofen 800 MG tablet  Commonly known as:  ADVIL,MOTRIN  Take 1 tablet (800 mg total) by mouth 3 (three) times daily.     methocarbamol 500 MG tablet  Commonly known as:  ROBAXIN  Take 1 tablet (500 mg total) by mouth 2 (two) times daily.     oxyCODONE-acetaminophen 10-325 MG per tablet  Commonly known as:  PERCOCET  Take 1 tablet by mouth every 4 (four) hours as needed for pain.     polyethylene glycol packet  Commonly known as:  MIRALAX / GLYCOLAX  Take 17 g by mouth daily as needed for mild constipation.     senna-docusate 8.6-50 MG per tablet  Commonly known as:  Senokot-S  Take 1 tablet by mouth 2 (two) times daily.          The results of significant diagnostics from this hospitalization (including imaging, microbiology, ancillary and laboratory) are listed below for reference.    Significant Diagnostic Studies: No results found.  Microbiology: No results found for this or any previous visit (from the past 240 hour(s)).   Labs: Basic Metabolic Panel:  Recent Labs Lab 06/30/14 1735 07/01/14 0820 07/02/14 0500 07/03/14 0816 07/04/14 0939  NA 139 141 141 140 138  K 3.4* 3.8 3.3* 4.7 4.5  CL 105 108 104 103 99*  CO2 25 24 28 27 28   GLUCOSE 106* 94 104* 98 109*  BUN <5* <5* 8 9 16   CREATININE 0.59* 0.33* 0.36*  0.45* 0.58*  CALCIUM 8.9 8.6* 8.9 9.1 9.2  MG  --   --  1.9  --   --    Liver Function Tests:  Recent Labs Lab 06/30/14 1735  AST 32  ALT 13*  ALKPHOS 83  BILITOT 5.7*  PROT 6.9  ALBUMIN 4.2   No results for input(s): LIPASE, AMYLASE in the last 168 hours. No results for input(s): AMMONIA in the last 168 hours. CBC:  Recent Labs Lab 06/30/14 1735 07/01/14 0820 07/02/14 0500 07/03/14 0816 07/04/14 0939 07/05/14 0445  WBC 11.7* 15.2* 13.7* 12.5* 15.0* 9.4  NEUTROABS 6.4  --  8.4* 8.3* 11.6* 5.3  HGB 8.2* 7.3* 6.9* 7.3* 7.6* 7.8*  HCT 23.0* 21.1* 19.6* 22.0* 22.6* 23.4*  MCV 81.6  85.8 85.6 88.7 88.6 88.6  PLT 443* 396 422* 476* 454* 391   Cardiac Enzymes: No results for input(s): CKTOTAL, CKMB, CKMBINDEX, TROPONINI in the last 168 hours. BNP: Invalid input(s): POCBNP CBG: No results for input(s): GLUCAP in the last 168 hours.  Time coordinating discharge: >5min  SignedKalman Shan Sickle Cell Service 07/05/2014, 9:04 AM

## 2014-07-05 NOTE — Progress Notes (Signed)
Patient called RN to room,patient lying in bed,no distress noted. Patient stated" he was in too much pain to go home". Pt requested I call MD to see if he could stay. RN spoke with Dr. Orest Dikes about patients request, MD stated he needed to be discharged, to instruct patient to take PO pain meds at home like he was here. Patient informed of MD decision. Will continue to monitor patient. C.Naasia Weilbacher,RN

## 2014-07-06 ENCOUNTER — Emergency Department (HOSPITAL_COMMUNITY)
Admission: EM | Admit: 2014-07-06 | Discharge: 2014-07-07 | Disposition: A | Discharge status: Home / Self Care | Payer: Medicaid Other | Attending: Emergency Medicine | Admitting: Emergency Medicine

## 2014-07-06 DIAGNOSIS — Z79899 Other long term (current) drug therapy: Secondary | ICD-10-CM | POA: Insufficient documentation

## 2014-07-06 DIAGNOSIS — F329 Major depressive disorder, single episode, unspecified: Secondary | ICD-10-CM | POA: Diagnosis not present

## 2014-07-06 DIAGNOSIS — M545 Low back pain: Secondary | ICD-10-CM | POA: Diagnosis not present

## 2014-07-06 DIAGNOSIS — D5700 Hb-SS disease with crisis, unspecified: Secondary | ICD-10-CM | POA: Diagnosis not present

## 2014-07-06 DIAGNOSIS — E559 Vitamin D deficiency, unspecified: Secondary | ICD-10-CM | POA: Diagnosis not present

## 2014-07-07 ENCOUNTER — Encounter (HOSPITAL_COMMUNITY): Payer: Self-pay | Admitting: *Deleted

## 2014-07-07 LAB — COMPREHENSIVE METABOLIC PANEL
ALT: 22 U/L (ref 17–63)
AST: 51 U/L — ABNORMAL HIGH (ref 15–41)
Albumin: 4.8 g/dL (ref 3.5–5.0)
Alkaline Phosphatase: 81 U/L (ref 38–126)
Anion gap: 12 (ref 5–15)
BUN: 21 mg/dL — ABNORMAL HIGH (ref 6–20)
CO2: 24 mmol/L (ref 22–32)
Calcium: 9.4 mg/dL (ref 8.9–10.3)
Chloride: 97 mmol/L — ABNORMAL LOW (ref 101–111)
Creatinine, Ser: 0.78 mg/dL (ref 0.61–1.24)
GFR calc Af Amer: >60 mL/min (ref >60)
GFR calc non Af Amer: >60 mL/min (ref >60)
Glucose, Bld: 94 mg/dL (ref 65–99)
Potassium: 4.3 mmol/L (ref 3.5–5.1)
Sodium: 133 mmol/L — ABNORMAL LOW (ref 135–145)
Total Bilirubin: 3.9 mg/dL — ABNORMAL HIGH (ref 0.3–1.2)
Total Protein: 9.1 g/dL — ABNORMAL HIGH (ref 6.5–8.1)

## 2014-07-07 LAB — CBC WITH DIFFERENTIAL/PLATELET
Basophils Absolute: 0 10*3/uL (ref 0.0–0.1)
Basophils Relative: 0 % (ref 0–1)
Eosinophils Absolute: 0 10*3/uL (ref 0.0–0.7)
Eosinophils Relative: 0 % (ref 0–5)
HCT: 20.1 % — ABNORMAL LOW (ref 39.0–52.0)
Hemoglobin: 7 g/dL — ABNORMAL LOW (ref 13.0–17.0)
Lymphocytes Relative: 35 % (ref 12–46)
Lymphs Abs: 5.1 10*3/uL — ABNORMAL HIGH (ref 0.7–4.0)
MCH: 29.9 pg (ref 26.0–34.0)
MCHC: 34.8 g/dL (ref 30.0–36.0)
MCV: 85.9 fL (ref 78.0–100.0)
Monocytes Absolute: 0.7 10*3/uL (ref 0.1–1.0)
Monocytes Relative: 5 % (ref 3–12)
Neutro Abs: 8.9 10*3/uL — ABNORMAL HIGH (ref 1.7–7.7)
Neutrophils Relative %: 60 % (ref 43–77)
Platelets: 370 10*3/uL (ref 150–400)
RBC: 2.34 MIL/uL — ABNORMAL LOW (ref 4.22–5.81)
RDW: 22.3 % — ABNORMAL HIGH (ref 11.5–15.5)
WBC: 14.7 10*3/uL — ABNORMAL HIGH (ref 4.0–10.5)
nRBC: 5 /100 WBC — ABNORMAL HIGH

## 2014-07-07 LAB — RETICULOCYTES

## 2014-07-07 MED ORDER — KETOROLAC TROMETHAMINE 30 MG/ML IJ SOLN
30.0000 mg | Freq: Once | INTRAMUSCULAR | Status: DC
  Filled 2014-07-07 (×2): qty 1

## 2014-07-07 MED ORDER — SODIUM CHLORIDE 0.9 % IV SOLN
INTRAVENOUS | Status: DC
  Administered 2014-07-07: 03:00:00 via INTRAVENOUS

## 2014-07-07 MED ORDER — KETAMINE HCL 10 MG/ML IJ SOLN
0.1500 mg/kg | Freq: Once | INTRAMUSCULAR | Status: AC
  Administered 2014-07-07: 8.4 mg via INTRAVENOUS

## 2014-07-07 MED ORDER — OXYCODONE-ACETAMINOPHEN 5-325 MG PO TABS
2.0000 | ORAL_TABLET | Freq: Once | ORAL | Status: AC
  Administered 2014-07-07: 2 via ORAL
  Filled 2014-07-07: qty 2

## 2014-07-07 MED ORDER — DIPHENHYDRAMINE HCL 25 MG PO CAPS
50.0000 mg | ORAL_CAPSULE | Freq: Once | ORAL | Status: AC
  Administered 2014-07-07: 50 mg via ORAL
  Filled 2014-07-07: qty 2

## 2014-07-07 MED ORDER — METHOCARBAMOL 500 MG PO TABS
1000.0000 mg | ORAL_TABLET | Freq: Once | ORAL | Status: AC
  Administered 2014-07-07: 1000 mg via ORAL
  Filled 2014-07-07: qty 2

## 2014-07-07 MED ORDER — HYDROMORPHONE HCL 1 MG/ML IJ SOLN
1.0000 mg | Freq: Once | INTRAMUSCULAR | Status: AC
  Administered 2014-07-07: 1 mg via INTRAVENOUS
  Filled 2014-07-07: qty 1

## 2014-07-07 MED ORDER — OXYCODONE-ACETAMINOPHEN 10-325 MG PO TABS: 1.0000 | ORAL_TABLET | ORAL | Status: DC | PRN

## 2014-07-07 NOTE — ED Provider Notes (Signed)
CSN: [id number]     Arrival date & time 07/06/14  2340 History   First MD Initiated Contact with Patient 07/07/14 0051     Chief Complaint  Patient presents with  . Sickle Cell Pain Crisis     (Consider location/radiation/quality/duration/timing/severity/associated sxs/prior Treatment) HPI 24 year old male presents to emergency department with complaint of sickle cell crisis.  He has pain across his low back which is his typical presentation.  Patient was discharged from the hospital on Saturday.  He reports he took his last 2 Percocet.  He does not have a follow-up with his primary care doctor until later this week.  He denies any fever, chills, nausea, vomiting, diarrhea. Past Medical History  Diagnosis Date  . Sickle cell anemia   . 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 Vitamin D deficiency y-6  . Depression    Past Surgical History  Procedure Laterality Date  . Cholecystectomy     Family History  Problem Relation Age of Onset  . Sickle cell trait Mother   . Sickle cell trait Father    History  Substance Use Topics  . Smoking status: Never Smoker   . Smokeless tobacco: Never Used  . Alcohol Use: No    Review of Systems  See History of Present Illness; otherwise all other systems are reviewed and negative   Allergies  Levaquin; Morphine and related; Toradol; Tramadol; Vancomycin; and Zosyn  Home Medications   Prior to Admission medications   Medication Sig Start Date End Date Taking? Authorizing Provider  folic acid (FOLVITE) 1 MG tablet Take 1 mg by mouth daily.   Yes Historical Provider, MD  hydroxyurea (HYDREA) 500 MG capsule Take 500 mg by mouth 2 (two) times daily. May take with food to minimize GI side effects.   Yes Historical Provider, MD  oxyCODONE-acetaminophen (PERCOCET) 10-325 MG per tablet Take 1 tablet by mouth every 4 (four) hours as needed for pain. Patient taking differently: Take 1 tablet by  mouth every 6 (six) hours as needed for pain.  05/11/14  Yes Olabunmi A Agboola, MD  aspirin 81 MG chewable tablet Chew 1 tablet (81 mg total) by mouth daily. Patient not taking: Reported on 05/05/2014 04/09/14   Leana Gamer, MD  cholecalciferol (VITAMIN D) 1000 UNITS tablet Take 2 tablets (2,000 Units total) by mouth daily. Patient not taking: Reported on 05/05/2014 04/09/14   Leana Gamer, MD  diclofenac sodium (VOLTAREN) 1 % GEL Apply 2 g topically 4 (four) times daily. Patient not taking: Reported on 05/05/2014 04/09/14   Leana Gamer, MD  escitalopram (LEXAPRO) 10 MG tablet Take 1 tablet (10 mg total) by mouth daily. Patient not taking: Reported on 05/05/2014 04/09/14   Leana Gamer, MD  feeding supplement, ENSURE COMPLETE, (ENSURE COMPLETE) LIQD Take 237 mLs by mouth 3 (three) times daily between meals. Patient not taking: Reported on 05/05/2014 04/09/14   Leana Gamer, MD  hydrOXYzine (ATARAX/VISTARIL) 50 MG tablet Take 1 tablet (50 mg total) by mouth at bedtime. Patient not taking: Reported on 05/05/2014 04/09/14   Leana Gamer, MD  ibuprofen (ADVIL,MOTRIN) 800 MG tablet Take 1 tablet (800 mg total) by mouth 3 (three) times daily. Patient not taking: Reported on 05/05/2014 02/12/14   Alexa Sherral Hammers, MD  methocarbamol (ROBAXIN) 500 MG tablet Take 1 tablet (500 mg total) by mouth 2 (two) times daily. Patient not taking: Reported on 03/29/2014 10/06/13   Hollace Kinnier  Sofia, PA-C  polyethylene glycol (MIRALAX / GLYCOLAX) packet Take 17 g by mouth daily as needed for mild constipation. Patient not taking: Reported on 05/05/2014 04/09/14   Leana Gamer, MD  senna-docusate (SENOKOT-S) 8.6-50 MG per tablet Take 1 tablet by mouth 2 (two) times daily. Patient not taking: Reported on 05/05/2014 04/09/14   Leana Gamer, MD   BP 114/57 mmHg  Pulse 92  Temp(Src) 99 F (37.2 C) (Oral)  Resp 18  SpO2 97% Physical Exam  Constitutional: He is oriented to  person, place, and time. He appears well-developed and well-nourished.  HENT:  Head: Normocephalic and atraumatic.  Nose: Nose normal.  Mouth/Throat: Oropharynx is clear and moist.  Eyes: Conjunctivae and EOM are normal. Pupils are equal, round, and reactive to light.  Neck: Normal range of motion. Neck supple. No JVD present. No tracheal deviation present. No thyromegaly present.  Cardiovascular: Normal rate, regular rhythm, normal heart sounds and intact distal pulses.  Exam reveals no gallop and no friction rub.   No murmur heard. Pulmonary/Chest: Effort normal and breath sounds normal. No stridor. No respiratory distress. He has no wheezes. He has no rales. He exhibits no tenderness.  Abdominal: Soft. Bowel sounds are normal. He exhibits no distension and no mass. There is no tenderness. There is no rebound and no guarding.  Musculoskeletal: Normal range of motion. He exhibits tenderness (complains of pain to lower back with palpation.  No step-off or crepitus noted). He exhibits no edema.  Lymphadenopathy:    He has no cervical adenopathy.  Neurological: He is alert and oriented to person, place, and time. He displays normal reflexes. He exhibits normal muscle tone. Coordination normal.  Skin: Skin is warm and dry. No rash noted. No erythema. No pallor.  Psychiatric: He has a normal mood and affect. His behavior is normal. Judgment and thought content normal.  Nursing note and vitals reviewed.   ED Course  Procedures (including critical care time) Labs Review Labs Reviewed  CBC WITH DIFFERENTIAL/PLATELET - Abnormal; Notable for the following:    WBC 14.7 (*)    RBC 2.34 (*)    Hemoglobin 7.0 (*)    HCT 20.1 (*)    RDW 22.3 (*)    nRBC 5 (*)    Neutro Abs 8.9 (*)    Lymphs Abs 5.1 (*)    All other components within normal limits  COMPREHENSIVE METABOLIC PANEL - Abnormal; Notable for the following:    Sodium 133 (*)    Chloride 97 (*)    BUN 21 (*)    Total Protein 9.1 (*)     AST 51 (*)    Total Bilirubin 3.9 (*)    All other components within normal limits  RETICULOCYTES    Imaging Review No results found.   EKG Interpretation None      MDM   Final diagnoses:  Sickle cell anemia with crisis    24 year old male history of sickle cell disease who was discharged on Saturday.  Patient has not had pain control at home per his report.  No relief with Robaxin, Toradol and Dilaudid.  Will try Dilaudid, Percocet and analgesic dose 0.15 mg/kg of ketamine.  3:54 AM Patient reports pain is nearly resolved, requests second dose of ketamine.  Patient overall stable.  Will give short course of Percocet until he is able to see his primary care doctor tomorrow or the next day.  Linton Flemings, MD 07/07/14 [phone]

## 2014-07-07 NOTE — Discharge Instructions (Signed)
Sickle Cell Anemia, Adult °Sickle cell anemia is a condition in which red blood cells have an abnormal "sickle" shape. This abnormal shape shortens the cells' life span, which results in a lower than normal concentration of red blood cells in the blood. The sickle shape also causes the cells to clump together and block free blood flow through the blood vessels. As a result, the tissues and organs of the body do not receive enough oxygen. Sickle cell anemia causes organ damage and pain and increases the risk of infection. °CAUSES  °Sickle cell anemia is a genetic disorder. Those who receive two copies of the gene have the condition, and those who receive one copy have the trait. °RISK FACTORS °The sickle cell gene is most common in people whose families originated in [place]. Other areas of the globe where sickle cell trait occurs include the [place], [place] and [place], the [place], and the [place].  °SIGNS AND SYMPTOMS °· Pain, especially in the extremities, back, chest, or abdomen (common). The pain may start suddenly or may develop following an illness, especially if there is dehydration. Pain can also occur due to overexertion or exposure to extreme temperature changes. °· Frequent severe bacterial infections, especially certain types of pneumonia and meningitis. °· Pain and swelling in the hands and feet. °· Decreased activity.   °· Loss of appetite.   °· Change in behavior. °· Headaches. °· Seizures. °· Shortness of breath or difficulty breathing. °· Vision changes. °· Skin ulcers. °Those with the trait may not have symptoms or they may have mild symptoms.  °DIAGNOSIS  °Sickle cell anemia is diagnosed with blood tests that demonstrate the genetic trait. It is often diagnosed during the newborn period, due to mandatory testing nationwide. A variety of blood tests, X-rays, CT scans, MRI scans, ultrasounds, and lung function tests may also be done to monitor the condition. °TREATMENT  °Sickle  cell anemia may be treated with: °· Medicines. You may be given pain medicines, antibiotic medicines (to treat and prevent infections) or medicines to increase the production of certain types of hemoglobin. °· Fluids. °· Oxygen. °· Blood transfusions. °HOME CARE INSTRUCTIONS  °· Drink enough fluid to keep your urine clear or pale yellow. Increase your fluid intake in hot weather and during exercise. °· Do not smoke. Smoking lowers oxygen levels in the blood.   °· Only take over-the-counter or prescription medicines for pain, fever, or discomfort as directed by your health care provider. °· Take antibiotics as directed by your health care provider. Make sure you finish them it even if you start to feel better.   °· Take supplements as directed by your health care provider.   °· Consider wearing a medical alert bracelet. This tells anyone caring for you in an emergency of your condition.   °· When traveling, keep your medical information, health care provider's names, and the medicines you take with you at all times.   °· If you develop a fever, do not take medicines to reduce the fever right away. This could cover up a problem that is developing. Notify your health care provider. °· Keep all follow-up appointments with your health care provider. Sickle cell anemia requires regular medical care. °SEEK MEDICAL CARE IF: ° You have a fever. °SEEK IMMEDIATE MEDICAL CARE IF:  °· You feel dizzy or faint.   °· You have new abdominal pain, especially on the left side near the stomach area.   °· You develop a persistent, often uncomfortable and painful penile erection (priapism). If this is not treated immediately it   will lead to impotence.   °· You have numbness your arms or legs or you have a hard time moving them.   °· You have a hard time with speech.   °· You have a fever or persistent symptoms for more than 2-3 days.   °· You have a fever and your symptoms suddenly get worse.   °· You have signs or symptoms of infection.  These include:   °¨ Chills.   °¨ Abnormal tiredness (lethargy).   °¨ Irritability.   °¨ Poor eating.   °¨ Vomiting.   °· You develop pain that is not helped with medicine.   °· You develop shortness of breath. °· You have pain in your chest.   °· You are coughing up pus-like or bloody sputum.   °· You develop a stiff neck. °· Your feet or hands swell or have pain. °· Your abdomen appears bloated. °· You develop joint pain. °MAKE SURE YOU: °· Understand these instructions. °Document Released: 05/11/2005 Document Revised: 06/17/2013 Document Reviewed: 09/12/2012 °Exit[employer]® Patient Information ©2015 [employer]. This information is not intended to replace advice given to you by your health care provider. Make sure you discuss any questions you have with your health care provider. ° °

## 2014-07-07 NOTE — ED Notes (Signed)
Patient complains of back pain from sickle cell crisis. Pt able to move freely and resting quietly/using cell phone.

## 2014-07-07 NOTE — ED Notes (Signed)
Patient requested more pain medication. Informed Dr. Margaretha Seeds.

## 2014-07-07 NOTE — ED Notes (Signed)
Pt reports being discharged from the hospital yesterday, and was instructed to come to the ED if pain is getting worse.  Pt reports back pain d/t sickle cell crisis.

## 2014-07-07 NOTE — ED Notes (Signed)
Pt awaiting for a ride before discharge.

## 2014-07-07 NOTE — ED Notes (Signed)
Pt talking on cell phone when entering the room and while assessing pain. Pt resting with no signs of distress.

## 2014-07-31 ENCOUNTER — Emergency Department (HOSPITAL_COMMUNITY)
Admission: EM | Admit: 2014-07-31 | Discharge: 2014-07-31 | Disposition: A | Discharge status: Home / Self Care | Payer: Medicaid Other | Source: Home / Self Care | Attending: Emergency Medicine | Admitting: Emergency Medicine

## 2014-07-31 ENCOUNTER — Emergency Department (HOSPITAL_COMMUNITY): Payer: Medicaid Other

## 2014-07-31 ENCOUNTER — Encounter (HOSPITAL_COMMUNITY): Payer: Self-pay

## 2014-07-31 ENCOUNTER — Inpatient Hospital Stay (HOSPITAL_COMMUNITY)
Admission: EM | Admit: 2014-07-31 | Discharge: 2014-08-11 | DRG: 811 | Disposition: A | Discharge status: Home / Self Care | Payer: Medicaid Other | Attending: Internal Medicine | Admitting: Internal Medicine

## 2014-07-31 ENCOUNTER — Encounter (HOSPITAL_COMMUNITY): Payer: Self-pay | Admitting: Family Medicine

## 2014-07-31 DIAGNOSIS — Z880 Allergy status to penicillin: Secondary | ICD-10-CM

## 2014-07-31 DIAGNOSIS — F329 Major depressive disorder, single episode, unspecified: Secondary | ICD-10-CM | POA: Insufficient documentation

## 2014-07-31 DIAGNOSIS — R079 Chest pain, unspecified: Secondary | ICD-10-CM

## 2014-07-31 DIAGNOSIS — E860 Dehydration: Secondary | ICD-10-CM | POA: Diagnosis present

## 2014-07-31 DIAGNOSIS — A57 Chancroid: Secondary | ICD-10-CM | POA: Diagnosis present

## 2014-07-31 DIAGNOSIS — G8929 Other chronic pain: Secondary | ICD-10-CM

## 2014-07-31 DIAGNOSIS — K5900 Constipation, unspecified: Secondary | ICD-10-CM

## 2014-07-31 DIAGNOSIS — M79661 Pain in right lower leg: Secondary | ICD-10-CM | POA: Diagnosis present

## 2014-07-31 DIAGNOSIS — D571 Sickle-cell disease without crisis: Secondary | ICD-10-CM

## 2014-07-31 DIAGNOSIS — Z8639 Personal history of other endocrine, nutritional and metabolic disease: Secondary | ICD-10-CM

## 2014-07-31 DIAGNOSIS — D5700 Hb-SS disease with crisis, unspecified: Principal | ICD-10-CM | POA: Diagnosis present

## 2014-07-31 DIAGNOSIS — M879 Osteonecrosis, unspecified: Secondary | ICD-10-CM | POA: Diagnosis present

## 2014-07-31 DIAGNOSIS — R0789 Other chest pain: Secondary | ICD-10-CM | POA: Diagnosis present

## 2014-07-31 DIAGNOSIS — Z681 Body mass index (BMI) 19 or less, adult: Secondary | ICD-10-CM

## 2014-07-31 DIAGNOSIS — M90512 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, left shoulder: Secondary | ICD-10-CM | POA: Diagnosis present

## 2014-07-31 DIAGNOSIS — Z881 Allergy status to other antibiotic agents status: Secondary | ICD-10-CM

## 2014-07-31 DIAGNOSIS — N489 Disorder of penis, unspecified: Secondary | ICD-10-CM | POA: Diagnosis present

## 2014-07-31 DIAGNOSIS — Z79899 Other long term (current) drug therapy: Secondary | ICD-10-CM

## 2014-07-31 DIAGNOSIS — Z8739 Personal history of other diseases of the musculoskeletal system and connective tissue: Secondary | ICD-10-CM | POA: Insufficient documentation

## 2014-07-31 DIAGNOSIS — Z886 Allergy status to analgesic agent status: Secondary | ICD-10-CM

## 2014-07-31 DIAGNOSIS — Z7982 Long term (current) use of aspirin: Secondary | ICD-10-CM

## 2014-07-31 DIAGNOSIS — E559 Vitamin D deficiency, unspecified: Secondary | ICD-10-CM | POA: Diagnosis present

## 2014-07-31 DIAGNOSIS — E43 Unspecified severe protein-calorie malnutrition: Secondary | ICD-10-CM | POA: Diagnosis present

## 2014-07-31 DIAGNOSIS — Z885 Allergy status to narcotic agent status: Secondary | ICD-10-CM

## 2014-07-31 DIAGNOSIS — R110 Nausea: Secondary | ICD-10-CM | POA: Diagnosis present

## 2014-07-31 DIAGNOSIS — F419 Anxiety disorder, unspecified: Secondary | ICD-10-CM | POA: Diagnosis present

## 2014-07-31 DIAGNOSIS — F332 Major depressive disorder, recurrent severe without psychotic features: Secondary | ICD-10-CM | POA: Diagnosis present

## 2014-07-31 DIAGNOSIS — D72829 Elevated white blood cell count, unspecified: Secondary | ICD-10-CM | POA: Diagnosis present

## 2014-07-31 DIAGNOSIS — R4689 Other symptoms and signs involving appearance and behavior: Secondary | ICD-10-CM | POA: Diagnosis present

## 2014-07-31 DIAGNOSIS — M545 Low back pain: Secondary | ICD-10-CM | POA: Diagnosis present

## 2014-07-31 DIAGNOSIS — Z79891 Long term (current) use of opiate analgesic: Secondary | ICD-10-CM

## 2014-07-31 LAB — CBC WITH DIFFERENTIAL/PLATELET
Basophils Absolute: 0 10*3/uL (ref 0.0–0.1)
Basophils Relative: 0 % (ref 0–1)
Eosinophils Absolute: 0.2 10*3/uL (ref 0.0–0.7)
Eosinophils Relative: 1 % (ref 0–5)
HCT: 27.5 % — ABNORMAL LOW (ref 39.0–52.0)
Hemoglobin: 9.6 g/dL — ABNORMAL LOW (ref 13.0–17.0)
Lymphocytes Relative: 12 % (ref 12–46)
Lymphs Abs: 2 10*3/uL (ref 0.7–4.0)
MCH: 29.7 pg (ref 26.0–34.0)
MCHC: 34.9 g/dL (ref 30.0–36.0)
MCV: 85.1 fL (ref 78.0–100.0)
Monocytes Absolute: 1.5 10*3/uL — ABNORMAL HIGH (ref 0.1–1.0)
Monocytes Relative: 9 % (ref 3–12)
Neutro Abs: 13.2 10*3/uL — ABNORMAL HIGH (ref 1.7–7.7)
Neutrophils Relative %: 78 % — ABNORMAL HIGH (ref 43–77)
Platelets: 485 10*3/uL — ABNORMAL HIGH (ref 150–400)
RBC: 3.23 MIL/uL — ABNORMAL LOW (ref 4.22–5.81)
RDW: 19 % — ABNORMAL HIGH (ref 11.5–15.5)
WBC: 16.9 10*3/uL — ABNORMAL HIGH (ref 4.0–10.5)

## 2014-07-31 LAB — BASIC METABOLIC PANEL
Anion gap: 6 (ref 5–15)
BUN: 11 mg/dL (ref 6–20)
CO2: 25 mmol/L (ref 22–32)
Calcium: 8.4 mg/dL — ABNORMAL LOW (ref 8.9–10.3)
Chloride: 105 mmol/L (ref 101–111)
Creatinine, Ser: 0.5 mg/dL — ABNORMAL LOW (ref 0.61–1.24)
GFR calc Af Amer: >60 mL/min (ref >60)
GFR calc non Af Amer: >60 mL/min (ref >60)
Glucose, Bld: 90 mg/dL (ref 65–99)
Potassium: 3.7 mmol/L (ref 3.5–5.1)
Sodium: 136 mmol/L (ref 135–145)

## 2014-07-31 LAB — URINALYSIS, ROUTINE W REFLEX MICROSCOPIC
Glucose, UA: NEGATIVE mg/dL
Hgb urine dipstick: NEGATIVE
Ketones, ur: NEGATIVE mg/dL
Leukocytes, UA: NEGATIVE
Nitrite: NEGATIVE
Protein, ur: NEGATIVE mg/dL
Specific Gravity, Urine: 1.013 (ref 1.005–1.030)
Urobilinogen, UA: >8 mg/dL — ABNORMAL HIGH (ref 0.0–1.0)
pH: 7.5 (ref 5.0–8.0)

## 2014-07-31 LAB — RAPID URINE DRUG SCREEN, HOSP PERFORMED
Amphetamines: NOT DETECTED
Barbiturates: NOT DETECTED
Benzodiazepines: NOT DETECTED
Cocaine: NOT DETECTED
Opiates: POSITIVE — AB
Tetrahydrocannabinol: POSITIVE — AB

## 2014-07-31 MED ORDER — KETOROLAC TROMETHAMINE 30 MG/ML IJ SOLN
30.0000 mg | Freq: Once | INTRAMUSCULAR | Status: DC
  Filled 2014-07-31: qty 1

## 2014-07-31 MED ORDER — SODIUM CHLORIDE 0.9 % IV SOLN
INTRAVENOUS | Status: DC
Start: 2014-07-31 — End: 2014-07-31
  Administered 2014-07-31: 10:00:00 via INTRAVENOUS

## 2014-07-31 MED ORDER — HYDROMORPHONE HCL 1 MG/ML IJ SOLN
1.0000 mg | Freq: Once | INTRAMUSCULAR | Status: AC
  Administered 2014-07-31: 1 mg via INTRAVENOUS
  Filled 2014-07-31: qty 1

## 2014-07-31 MED ORDER — DIPHENHYDRAMINE HCL 25 MG PO CAPS
50.0000 mg | ORAL_CAPSULE | Freq: Once | ORAL | Status: AC
  Administered 2014-07-31: 50 mg via ORAL
  Filled 2014-07-31: qty 2

## 2014-07-31 MED ORDER — ONDANSETRON HCL 4 MG/2ML IJ SOLN
4.0000 mg | Freq: Once | INTRAMUSCULAR | Status: AC
  Administered 2014-07-31: 4 mg via INTRAVENOUS
  Filled 2014-07-31: qty 2

## 2014-07-31 MED ORDER — SODIUM CHLORIDE 0.9 % IV BOLUS (SEPSIS)
1000.0000 mL | Freq: Once | INTRAVENOUS | Status: AC
  Administered 2014-07-31: 1000 mL via INTRAVENOUS

## 2014-07-31 MED ORDER — KETAMINE HCL 10 MG/ML IJ SOLN
0.1500 mg/kg | Freq: Once | INTRAMUSCULAR | Status: AC
  Administered 2014-08-01: 9.2 mg via INTRAVENOUS

## 2014-07-31 MED ORDER — HYDROMORPHONE HCL 1 MG/ML IJ SOLN
1.0000 mg | INTRAMUSCULAR | Status: DC | PRN
  Administered 2014-07-31 (×3): 1 mg via INTRAVENOUS
  Filled 2014-07-31 (×3): qty 1

## 2014-07-31 NOTE — ED Notes (Signed)
Tech, made 2 attempts to draw bllod

## 2014-07-31 NOTE — ED Notes (Signed)
Pt presents with c/o sickle cell pain. Pt reports he has pain in his chest, his back, and his right leg. Pt was seen for the same earlier today at this facility and pt reports that the pain has not gotten any better since he went home.

## 2014-07-31 NOTE — ED Notes (Signed)
Pt wants blood to be collected while he is getting an IV put in

## 2014-07-31 NOTE — ED Notes (Signed)
Patient states he woke up out of his sleep with experiencing sickle cell pain in his chest, right leg, and lower back pain. Pt has not took any medications for pain.

## 2014-07-31 NOTE — ED Provider Notes (Signed)
CSN: [id number]     Arrival date & time 07/31/14  6294 History   First MD Initiated Contact with Patient 07/31/14 [phone]     Chief Complaint  Patient presents with  . Sickle Cell Pain Crisis  . Chest Pain  . Back Pain  . Leg Pain     (Consider location/radiation/quality/duration/timing/severity/associated sxs/prior Treatment) The history is provided by the patient.     Timothy Marks is a 24 y.o. male who presents for evaluation of typical sickle cell crisis. He states that he was awakened from sleep this morning with chest pain, low back pain and pain. He tried Advil without relief of his pain. He states that his last narcotic pill used was 2 days ago. He was recently hospitalized for sickle cell crisis on 07/06/2014. He denies fever, chills, nausea, vomiting, cough, shortness of breath, or dizziness. There are no other known modifying factors.    Past Medical History  Diagnosis Date  . Sickle cell anemia   . 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 Vitamin D deficiency y-6  . Depression    Past Surgical History  Procedure Laterality Date  . Cholecystectomy     Family History  Problem Relation Age of Onset  . Sickle cell trait Mother   . Sickle cell trait Father    History  Substance Use Topics  . Smoking status: Never Smoker   . Smokeless tobacco: Never Used  . Alcohol Use: No    Review of Systems  All other systems reviewed and are negative.     Allergies  Levaquin; Morphine and related; Toradol; Tramadol; Vancomycin; and Zosyn  Home Medications   Prior to Admission medications   Medication Sig Start Date End Date Taking? Authorizing Provider  aspirin 81 MG chewable tablet Chew 1 tablet (81 mg total) by mouth daily. Patient not taking: Reported on 05/05/2014 04/09/14   Leana Gamer, MD  cholecalciferol (VITAMIN D) 1000 UNITS tablet Take 2 tablets (2,000 Units total) by mouth daily. Patient not  taking: Reported on 05/05/2014 04/09/14   Leana Gamer, MD  diclofenac sodium (VOLTAREN) 1 % GEL Apply 2 g topically 4 (four) times daily. Patient not taking: Reported on 05/05/2014 04/09/14   Leana Gamer, MD  escitalopram (LEXAPRO) 10 MG tablet Take 1 tablet (10 mg total) by mouth daily. Patient not taking: Reported on 05/05/2014 04/09/14   Leana Gamer, MD  feeding supplement, ENSURE COMPLETE, (ENSURE COMPLETE) LIQD Take 237 mLs by mouth 3 (three) times daily between meals. Patient not taking: Reported on 05/05/2014 04/09/14   Leana Gamer, MD  folic acid (FOLVITE) 1 MG tablet Take 1 mg by mouth daily.    Historical Provider, MD  hydroxyurea (HYDREA) 500 MG capsule Take 500 mg by mouth 2 (two) times daily. May take with food to minimize GI side effects.    Historical Provider, MD  hydrOXYzine (ATARAX/VISTARIL) 50 MG tablet Take 1 tablet (50 mg total) by mouth at bedtime. Patient not taking: Reported on 05/05/2014 04/09/14   Leana Gamer, MD  ibuprofen (ADVIL,MOTRIN) 800 MG tablet Take 1 tablet (800 mg total) by mouth 3 (three) times daily. Patient not taking: Reported on 05/05/2014 02/12/14   Alexa Sherral Hammers, MD  methocarbamol (ROBAXIN) 500 MG tablet Take 1 tablet (500 mg total) by mouth 2 (two) times daily. Patient not taking: Reported on 03/29/2014 10/06/13   Fransico Meadow, PA-C  oxyCODONE-acetaminophen (PERCOCET) 10-325 MG per  tablet Take 1 tablet by mouth every 4 (four) hours as needed for pain. 07/07/14   Linton Flemings, MD  polyethylene glycol [facility] / Floria Raveling) packet Take 17 g by mouth daily as needed for mild constipation. Patient not taking: Reported on 05/05/2014 04/09/14   Leana Gamer, MD  senna-docusate (SENOKOT-S) 8.6-50 MG per tablet Take 1 tablet by mouth 2 (two) times daily. Patient not taking: Reported on 05/05/2014 04/09/14   Leana Gamer, MD   BP 119/79 mmHg  Pulse 86  Temp(Src) 98.6 F (37 C) (Oral)  Resp 28  Ht 6\' 2"  (1.88 m)  Wt  135 lb (61.236 kg)  BMI 17.33 kg/m2  SpO2 99% Physical Exam  Constitutional: He is oriented to person, place, and time. He appears well-developed and well-nourished. He appears distressed (He is uncomfortable).  HENT:  Head: Normocephalic and atraumatic.  Right Ear: External ear normal.  Left Ear: External ear normal.  Eyes: Conjunctivae and EOM are normal. Pupils are equal, round, and reactive to light.  Neck: Normal range of motion and phonation normal. Neck supple.  Cardiovascular: Normal rate, regular rhythm and normal heart sounds.   Pulmonary/Chest: Effort normal and breath sounds normal. No respiratory distress. He has no wheezes. He has no rales. He exhibits no tenderness and no bony tenderness.  Abdominal: Soft. There is no tenderness.  Musculoskeletal: Normal range of motion.  Right leg is diffusely tender to palpation without obvious deformity or swelling. Low back has decreased range of motion secondary to pain. No deformity of the cervical, thoracic or lumbar spines.  Neurological: He is alert and oriented to person, place, and time. No cranial nerve deficit or sensory deficit. He exhibits normal muscle tone. Coordination normal.  Skin: Skin is warm, dry and intact.  Psychiatric: He has a normal mood and affect. His behavior is normal. Judgment and thought content normal.  Nursing note and vitals reviewed.   ED Course  Procedures (including critical care time) Medications  HYDROmorphone (DILAUDID) injection 1 mg (not administered)  ondansetron (ZOFRAN) injection 4 mg (not administered)  0.9 %  sodium chloride infusion (not administered)    Patient Vitals for the past 24 hrs:  BP Temp Temp src Pulse Resp SpO2 Height Weight  07/31/14 0936 - - - - - - 6\' 2"  (1.88 m) 135 lb (61.236 kg)  07/31/14 0929 119/79 mmHg 98.6 F (37 C) Oral 86 (!) 28 99 % - -   Evaluation discharge. He is more comfortable.   Labs Review Labs Reviewed  BASIC METABOLIC PANEL  CBC WITH  DIFFERENTIAL/PLATELET  URINALYSIS, ROUTINE W REFLEX MICROSCOPIC (NOT AT [facility])  URINE RAPID DRUG SCREEN, HOSP PERFORMED    HEMOGLOBIN  Date Value Ref Range Status  07/31/2014 9.6* 13.0 - 17.0 g/dL Final  07/07/2014 7.0* 13.0 - 17.0 g/dL Final  07/05/2014 7.8* 13.0 - 17.0 g/dL Final  07/04/2014 7.6* 13.0 - 17.0 g/dL Final     Imaging Review No results found.   EKG Interpretation   Date/Time:  Thursday July 31 2014 09:33:03 EDT Ventricular Rate:  84 PR Interval:  137 QRS Duration: 97 QT Interval:  359 QTC Calculation: 424 R Axis:   76 Text Interpretation:  Sinus rhythm Probable left atrial enlargement RSR'  in V1 or V2, probably normal variant Left ventricular hypertrophy since  last tracing no significant change Confirmed by Eulis Foster  MD, Franciscojavier Wronski ([id number])  on 07/31/2014 9:56:30 AM      MDM   Final diagnoses:  Chronic pain  Hb-SS disease  without crisis    This patient with chronic pain, and sickle cell anemia, presents for recurrent somatic pain. He takes 120 oxycodone 10 mg tablets each month, which are prescribed by his PCP. The urine drug screen is positive for opiates. Doubt sickle cell vaso-occlusive crisis. Doubt metabolic instability, serious bacterial infection or impending vascular collapse   Nursing Notes Reviewed/ Care Coordinated Applicable Imaging Reviewed Interpretation of Laboratory Data incorporated into ED treatment  The patient appears reasonably screened and/or stabilized for discharge and I doubt any other medical condition or other [facility] requiring further screening, evaluation, or treatment in the ED at this time prior to discharge.  Plan: Home Medications- usual; Home Treatments- fluids, rest; return here if the recommended treatment, does not improve the symptoms; Recommended follow up- PCP when necessary   Daleen Bo, MD 07/31/14 [phone]

## 2014-07-31 NOTE — Discharge Instructions (Signed)
There is no evidence for acute sickle cell crisis today. Follow-up with your doctor for further care and treatment as needed.   Chronic Pain Chronic pain can be defined as pain that is off and on and lasts for 3-6 months or longer. Many things cause chronic pain, which can make it difficult to make a diagnosis. There are many treatment options available for chronic pain. However, finding a treatment that works well for you may require trying various approaches until the right one is found. Many people benefit from a combination of two or more types of treatment to control their pain. SYMPTOMS  Chronic pain can occur anywhere in the body and can range from mild to very severe. Some types of chronic pain include:  Headache.  Low back pain.  Cancer pain.  Arthritis pain.  Neurogenic pain. This is pain resulting from damage to nerves. People with chronic pain may also have other symptoms such as:  Depression.  Anger.  Insomnia.  Anxiety. DIAGNOSIS  Your health care provider will help diagnose your condition over time. In many cases, the initial focus will be on excluding possible conditions that could be causing the pain. Depending on your symptoms, your health care provider may order tests to diagnose your condition. Some of these tests may include:   Blood tests.   CT scan.   MRI.   X-rays.   Ultrasounds.   Nerve conduction studies.  You may need to see a specialist.  TREATMENT  Finding treatment that works well may take time. You may be referred to a pain specialist. He or she may prescribe medicine or therapies, such as:   Mindful meditation or yoga.  Shots (injections) of numbing or pain-relieving medicines into the spine or area of pain.  Local electrical stimulation.  Acupuncture.   Massage therapy.   Aroma, color, light, or sound therapy.   Biofeedback.   Working with a physical therapist to keep from getting stiff.   Regular, gentle  exercise.   Cognitive or behavioral therapy.   Group support.  Sometimes, surgery may be recommended.  HOME CARE INSTRUCTIONS   Take all medicines as directed by your health care provider.   Lessen stress in your life by relaxing and doing things such as listening to calming music.   Exercise or be active as directed by your health care provider.   Eat a healthy diet and include things such as vegetables, fruits, fish, and lean meats in your diet.   Keep all follow-up appointments with your health care provider.   Attend a support group with others suffering from chronic pain. SEEK MEDICAL CARE IF:   Your pain gets worse.   You develop a new pain that was not there before.   You cannot tolerate medicines given to you by your health care provider.   You have new symptoms since your last visit with your health care provider.  SEEK IMMEDIATE MEDICAL CARE IF:   You feel weak.   You have decreased sensation or numbness.   You lose control of bowel or bladder function.   Your pain suddenly gets much worse.   You develop shaking.  You develop chills.  You develop confusion.  You develop chest pain.  You develop shortness of breath.  MAKE SURE YOU:  Understand these instructions.  Will watch your condition.  Will get help right away if you are not doing well or get worse. Document Released: 10/23/2001 Document Revised: 10/03/2012 Document Reviewed: 07/27/2012 [employer] Patient Information 2015  [employer]. This information is not intended to replace advice given to you by your health care provider. Make sure you discuss any questions you have with your health care provider.  Sickle Cell Anemia, Adult Sickle cell anemia is a condition in which red blood cells have an abnormal "sickle" shape. This abnormal shape shortens the cells' life span, which results in a lower than normal concentration of red blood cells in the blood. The sickle shape also  causes the cells to clump together and block free blood flow through the blood vessels. As a result, the tissues and organs of the body do not receive enough oxygen. Sickle cell anemia causes organ damage and pain and increases the risk of infection. CAUSES  Sickle cell anemia is a genetic disorder. Those who receive two copies of the gene have the condition, and those who receive one copy have the trait. RISK FACTORS The sickle cell gene is most common in people whose families originated in [place]. Other areas of the globe where sickle cell trait occurs include the [place], [place] and [place], and the [place].  SIGNS AND SYMPTOMS  Pain, especially in the extremities, back, chest, or abdomen (common). The pain may start suddenly or may develop following an illness, especially if there is dehydration. Pain can also occur due to overexertion or exposure to extreme temperature changes.  Frequent severe bacterial infections, especially certain types of pneumonia and meningitis.  Pain and swelling in the hands and feet.  Decreased activity.   Loss of appetite.   Change in behavior.  Headaches.  Seizures.  Shortness of breath or difficulty breathing.  Vision changes.  Skin ulcers. Those with the trait may not have symptoms or they may have mild symptoms.  DIAGNOSIS  Sickle cell anemia is diagnosed with blood tests that demonstrate the genetic trait. It is often diagnosed during the newborn period, due to mandatory testing nationwide. A variety of blood tests, X-rays, CT scans, MRI scans, ultrasounds, and lung function tests may also be done to monitor the condition. TREATMENT  Sickle cell anemia may be treated with:  Medicines. You may be given pain medicines, antibiotic medicines (to treat and prevent infections) or medicines to increase the production of certain types of hemoglobin.  Fluids.  Oxygen.  Blood transfusions. HOME CARE INSTRUCTIONS     Drink enough fluid to keep your urine clear or pale yellow. Increase your fluid intake in hot weather and during exercise.  Do not smoke. Smoking lowers oxygen levels in the blood.   Only take over-the-counter or prescription medicines for pain, fever, or discomfort as directed by your health care provider.  Take antibiotics as directed by your health care provider. Make sure you finish them it even if you start to feel better.   Take supplements as directed by your health care provider.   Consider wearing a medical alert bracelet. This tells anyone caring for you in an emergency of your condition.   When traveling, keep your medical information, health care provider's names, and the medicines you take with you at all times.   If you develop a fever, do not take medicines to reduce the fever right away. This could cover up a problem that is developing. Notify your health care provider.  Keep all follow-up appointments with your health care provider. Sickle cell anemia requires regular medical care. SEEK MEDICAL CARE IF: You have a fever. SEEK IMMEDIATE MEDICAL CARE IF:   You feel dizzy or faint.  You have new abdominal pain, especially on the left side near the stomach area.   You develop a persistent, often uncomfortable and painful penile erection (priapism). If this is not treated immediately it will lead to impotence.   You have numbness your arms or legs or you have a hard time moving them.   You have a hard time with speech.   You have a fever or persistent symptoms for more than 2-3 days.   You have a fever and your symptoms suddenly get worse.   You have signs or symptoms of infection. These include:   Chills.   Abnormal tiredness (lethargy).   Irritability.   Poor eating.   Vomiting.   You develop pain that is not helped with medicine.   You develop shortness of breath.  You have pain in your chest.   You are coughing up pus-like  or bloody sputum.   You develop a stiff neck.  Your feet or hands swell or have pain.  Your abdomen appears bloated.  You develop joint pain. MAKE SURE YOU:  Understand these instructions. Document Released: 05/11/2005 Document Revised: 06/17/2013 Document Reviewed: 09/12/2012 [facility] Patient Information 2015 [place], [place]. This information is not intended to replace advice given to you by your health care provider. Make sure you discuss any questions you have with your health care provider.

## 2014-08-01 ENCOUNTER — Encounter (HOSPITAL_COMMUNITY): Payer: Self-pay | Admitting: *Deleted

## 2014-08-01 DIAGNOSIS — K5909 Other constipation: Secondary | ICD-10-CM | POA: Diagnosis not present

## 2014-08-01 DIAGNOSIS — R42 Dizziness and giddiness: Secondary | ICD-10-CM | POA: Diagnosis not present

## 2014-08-01 DIAGNOSIS — Z79899 Other long term (current) drug therapy: Secondary | ICD-10-CM | POA: Diagnosis not present

## 2014-08-01 DIAGNOSIS — J189 Pneumonia, unspecified organism: Secondary | ICD-10-CM | POA: Diagnosis not present

## 2014-08-01 DIAGNOSIS — Z7982 Long term (current) use of aspirin: Secondary | ICD-10-CM | POA: Diagnosis not present

## 2014-08-01 DIAGNOSIS — M545 Low back pain: Secondary | ICD-10-CM | POA: Diagnosis present

## 2014-08-01 DIAGNOSIS — R0789 Other chest pain: Secondary | ICD-10-CM | POA: Diagnosis present

## 2014-08-01 DIAGNOSIS — Z681 Body mass index (BMI) 19 or less, adult: Secondary | ICD-10-CM | POA: Diagnosis not present

## 2014-08-01 DIAGNOSIS — A57 Chancroid: Secondary | ICD-10-CM | POA: Diagnosis present

## 2014-08-01 DIAGNOSIS — E559 Vitamin D deficiency, unspecified: Secondary | ICD-10-CM

## 2014-08-01 DIAGNOSIS — Z886 Allergy status to analgesic agent status: Secondary | ICD-10-CM | POA: Diagnosis not present

## 2014-08-01 DIAGNOSIS — Z79891 Long term (current) use of opiate analgesic: Secondary | ICD-10-CM | POA: Diagnosis not present

## 2014-08-01 DIAGNOSIS — E43 Unspecified severe protein-calorie malnutrition: Secondary | ICD-10-CM | POA: Diagnosis present

## 2014-08-01 DIAGNOSIS — Z880 Allergy status to penicillin: Secondary | ICD-10-CM | POA: Diagnosis not present

## 2014-08-01 DIAGNOSIS — R110 Nausea: Secondary | ICD-10-CM | POA: Diagnosis present

## 2014-08-01 DIAGNOSIS — E860 Dehydration: Secondary | ICD-10-CM | POA: Diagnosis present

## 2014-08-01 DIAGNOSIS — K5900 Constipation, unspecified: Secondary | ICD-10-CM | POA: Diagnosis present

## 2014-08-01 DIAGNOSIS — M79661 Pain in right lower leg: Secondary | ICD-10-CM | POA: Diagnosis present

## 2014-08-01 DIAGNOSIS — F419 Anxiety disorder, unspecified: Secondary | ICD-10-CM | POA: Diagnosis present

## 2014-08-01 DIAGNOSIS — N489 Disorder of penis, unspecified: Secondary | ICD-10-CM | POA: Diagnosis present

## 2014-08-01 DIAGNOSIS — D5700 Hb-SS disease with crisis, unspecified: Secondary | ICD-10-CM

## 2014-08-01 DIAGNOSIS — N4889 Other specified disorders of penis: Secondary | ICD-10-CM | POA: Diagnosis not present

## 2014-08-01 DIAGNOSIS — Z881 Allergy status to other antibiotic agents status: Secondary | ICD-10-CM | POA: Diagnosis not present

## 2014-08-01 DIAGNOSIS — D72829 Elevated white blood cell count, unspecified: Secondary | ICD-10-CM

## 2014-08-01 DIAGNOSIS — M879 Osteonecrosis, unspecified: Secondary | ICD-10-CM | POA: Diagnosis present

## 2014-08-01 DIAGNOSIS — F332 Major depressive disorder, recurrent severe without psychotic features: Secondary | ICD-10-CM | POA: Diagnosis present

## 2014-08-01 DIAGNOSIS — Z885 Allergy status to narcotic agent status: Secondary | ICD-10-CM | POA: Diagnosis not present

## 2014-08-01 DIAGNOSIS — R079 Chest pain, unspecified: Secondary | ICD-10-CM | POA: Diagnosis not present

## 2014-08-01 DIAGNOSIS — D638 Anemia in other chronic diseases classified elsewhere: Secondary | ICD-10-CM | POA: Diagnosis not present

## 2014-08-01 LAB — CBC WITH DIFFERENTIAL/PLATELET
Basophils Absolute: 0 10*3/uL (ref 0.0–0.1)
Basophils Relative: 0 % (ref 0–1)
Eosinophils Absolute: 0.1 10*3/uL (ref 0.0–0.7)
Eosinophils Relative: 1 % (ref 0–5)
HCT: 26.6 % — ABNORMAL LOW (ref 39.0–52.0)
Hemoglobin: 9.2 g/dL — ABNORMAL LOW (ref 13.0–17.0)
Lymphocytes Relative: 18 % (ref 12–46)
Lymphs Abs: 2.6 10*3/uL (ref 0.7–4.0)
MCH: 29.3 pg (ref 26.0–34.0)
MCHC: 34.6 g/dL (ref 30.0–36.0)
MCV: 84.7 fL (ref 78.0–100.0)
Monocytes Absolute: 1.7 10*3/uL — ABNORMAL HIGH (ref 0.1–1.0)
Monocytes Relative: 12 % (ref 3–12)
Neutro Abs: 9.5 10*3/uL — ABNORMAL HIGH (ref 1.7–7.7)
Neutrophils Relative %: 69 % (ref 43–77)
Platelets: 464 10*3/uL — ABNORMAL HIGH (ref 150–400)
RBC: 3.14 MIL/uL — ABNORMAL LOW (ref 4.22–5.81)
RDW: 19.6 % — ABNORMAL HIGH (ref 11.5–15.5)
WBC: 13.8 10*3/uL — ABNORMAL HIGH (ref 4.0–10.5)

## 2014-08-01 LAB — COMPREHENSIVE METABOLIC PANEL
ALT: 12 U/L — ABNORMAL LOW (ref 17–63)
AST: 27 U/L (ref 15–41)
Albumin: 3.9 g/dL (ref 3.5–5.0)
Alkaline Phosphatase: 66 U/L (ref 38–126)
Anion gap: 7 (ref 5–15)
BUN: 6 mg/dL (ref 6–20)
CO2: 27 mmol/L (ref 22–32)
Calcium: 8.8 mg/dL — ABNORMAL LOW (ref 8.9–10.3)
Chloride: 103 mmol/L (ref 101–111)
Creatinine, Ser: 0.42 mg/dL — ABNORMAL LOW (ref 0.61–1.24)
GFR calc Af Amer: >60 mL/min (ref >60)
GFR calc non Af Amer: >60 mL/min (ref >60)
Glucose, Bld: 107 mg/dL — ABNORMAL HIGH (ref 65–99)
Potassium: 3.8 mmol/L (ref 3.5–5.1)
Sodium: 137 mmol/L (ref 135–145)
Total Bilirubin: 6.7 mg/dL — ABNORMAL HIGH (ref 0.3–1.2)
Total Protein: 7 g/dL (ref 6.5–8.1)

## 2014-08-01 LAB — I-STAT CHEM 8, ED
BUN: 7 mg/dL (ref 6–20)
Calcium, Ion: 1.17 mmol/L (ref 1.12–1.23)
Chloride: 103 mmol/L (ref 101–111)
Creatinine, Ser: 0.6 mg/dL — ABNORMAL LOW (ref 0.61–1.24)
Glucose, Bld: 93 mg/dL (ref 65–99)
HCT: 32 % — ABNORMAL LOW (ref 39.0–52.0)
Hemoglobin: 10.9 g/dL — ABNORMAL LOW (ref 13.0–17.0)
Potassium: 3.8 mmol/L (ref 3.5–5.1)
Sodium: 138 mmol/L (ref 135–145)
TCO2: 24 mmol/L (ref 0–100)

## 2014-08-01 LAB — CBC
HCT: 26.1 % — ABNORMAL LOW (ref 39.0–52.0)
Hemoglobin: 8.9 g/dL — ABNORMAL LOW (ref 13.0–17.0)
MCH: 29.4 pg (ref 26.0–34.0)
MCHC: 34.1 g/dL (ref 30.0–36.0)
MCV: 86.1 fL (ref 78.0–100.0)
Platelets: 463 10*3/uL — ABNORMAL HIGH (ref 150–400)
RBC: 3.03 MIL/uL — ABNORMAL LOW (ref 4.22–5.81)
RDW: 19.1 % — ABNORMAL HIGH (ref 11.5–15.5)
WBC: 13.3 10*3/uL — ABNORMAL HIGH (ref 4.0–10.5)

## 2014-08-01 LAB — RETICULOCYTES
RBC.: 3.14 MIL/uL — ABNORMAL LOW (ref 4.22–5.81)
Retic Count, Absolute: 376.8 10*3/uL — ABNORMAL HIGH (ref 19.0–186.0)
Retic Ct Pct: 12 % — ABNORMAL HIGH (ref 0.4–3.1)

## 2014-08-01 MED ORDER — ONDANSETRON HCL 4 MG PO TABS: 4.0000 mg | ORAL_TABLET | Freq: Four times a day (QID) | ORAL | Status: DC | PRN

## 2014-08-01 MED ORDER — DIPHENHYDRAMINE HCL 12.5 MG/5ML PO ELIX
12.5000 mg | ORAL_SOLUTION | Freq: Four times a day (QID) | ORAL | Status: DC | PRN
  Administered 2014-08-01: 12.5 mg via ORAL
  Filled 2014-08-01: qty 5

## 2014-08-01 MED ORDER — ONDANSETRON HCL 4 MG/2ML IJ SOLN
4.0000 mg | Freq: Four times a day (QID) | INTRAMUSCULAR | Status: DC | PRN
  Administered 2014-08-01 – 2014-08-06 (×2): 4 mg via INTRAVENOUS
  Filled 2014-08-01 (×2): qty 2

## 2014-08-01 MED ORDER — DEXTROSE-NACL 5-0.45 % IV SOLN
INTRAVENOUS | Status: DC
  Administered 2014-08-01: 11:00:00 via INTRAVENOUS
  Administered 2014-08-01: 1000 mL via INTRAVENOUS
  Administered 2014-08-01 – 2014-08-02 (×2): via INTRAVENOUS
  Administered 2014-08-02 (×2): 1000 mL via INTRAVENOUS
  Administered 2014-08-03 – 2014-08-09 (×10): via INTRAVENOUS

## 2014-08-01 MED ORDER — HYDROXYUREA 500 MG PO CAPS
500.0000 mg | ORAL_CAPSULE | Freq: Two times a day (BID) | ORAL | Status: DC
  Administered 2014-08-01 – 2014-08-11 (×21): 500 mg via ORAL
  Filled 2014-08-01 (×22): qty 1

## 2014-08-01 MED ORDER — HYDROMORPHONE HCL 2 MG/ML IJ SOLN
2.0000 mg | Freq: Once | INTRAMUSCULAR | Status: AC
  Administered 2014-08-01: 2 mg via INTRAVENOUS
  Filled 2014-08-01: qty 1

## 2014-08-01 MED ORDER — HEPARIN SODIUM (PORCINE) 5000 UNIT/ML IJ SOLN
5000.0000 [IU] | Freq: Three times a day (TID) | INTRAMUSCULAR | Status: DC
Start: 2014-08-01 — End: 2014-08-11
  Administered 2014-08-04 – 2014-08-11 (×14): 5000 [IU] via SUBCUTANEOUS
  Filled 2014-08-01 (×34): qty 1

## 2014-08-01 MED ORDER — SODIUM CHLORIDE 0.9 % IJ SOLN: 9.0000 mL | INTRAMUSCULAR | Status: DC | PRN

## 2014-08-01 MED ORDER — IBUPROFEN 600 MG PO TABS
600.0000 mg | ORAL_TABLET | Freq: Three times a day (TID) | ORAL | Status: DC
  Administered 2014-08-01 – 2014-08-11 (×30): 600 mg via ORAL
  Filled 2014-08-01: qty 3
  Filled 2014-08-01: qty 1
  Filled 2014-08-01: qty 3
  Filled 2014-08-01 (×16): qty 1
  Filled 2014-08-01: qty 3
  Filled 2014-08-01 (×5): qty 1
  Filled 2014-08-01 (×2): qty 3
  Filled 2014-08-01: qty 1
  Filled 2014-08-01: qty 3
  Filled 2014-08-01: qty 1
  Filled 2014-08-01: qty 3
  Filled 2014-08-01 (×9): qty 1

## 2014-08-01 MED ORDER — ALUM & MAG HYDROXIDE-SIMETH 200-200-20 MG/5ML PO SUSP: 30.0000 mL | Freq: Four times a day (QID) | ORAL | Status: DC | PRN

## 2014-08-01 MED ORDER — KETAMINE HCL 10 MG/ML IJ SOLN
0.3000 mg/kg | Freq: Once | INTRAMUSCULAR | Status: AC
  Administered 2014-08-01: 18 mg via INTRAVENOUS

## 2014-08-01 MED ORDER — ASPIRIN 81 MG PO CHEW
81.0000 mg | CHEWABLE_TABLET | Freq: Every day | ORAL | Status: DC
  Administered 2014-08-01 – 2014-08-11 (×11): 81 mg via ORAL
  Filled 2014-08-01 (×11): qty 1

## 2014-08-01 MED ORDER — DIPHENHYDRAMINE HCL 50 MG/ML IJ SOLN
12.5000 mg | Freq: Four times a day (QID) | INTRAMUSCULAR | Status: DC | PRN
  Administered 2014-08-01: 12.5 mg via INTRAVENOUS
  Filled 2014-08-01: qty 1

## 2014-08-01 MED ORDER — HYDROMORPHONE HCL 2 MG/ML IJ SOLN
2.0000 mg | Freq: Once | INTRAMUSCULAR | Status: DC
  Filled 2014-08-01: qty 1

## 2014-08-01 MED ORDER — VITAMIN D3 25 MCG (1000 UNIT) PO TABS
2000.0000 [IU] | ORAL_TABLET | Freq: Every day | ORAL | Status: DC
  Administered 2014-08-01 – 2014-08-11 (×11): 2000 [IU] via ORAL
  Filled 2014-08-01 (×11): qty 2

## 2014-08-01 MED ORDER — ENSURE ENLIVE PO LIQD
237.0000 mL | Freq: Three times a day (TID) | ORAL | Status: DC
  Administered 2014-08-02 – 2014-08-11 (×23): 237 mL via ORAL

## 2014-08-01 MED ORDER — HYDROMORPHONE 0.3 MG/ML IV SOLN
INTRAVENOUS | Status: DC
  Administered 2014-08-01: 1 mg via INTRAVENOUS
  Administered 2014-08-01: 8.39 mg via INTRAVENOUS
  Administered 2014-08-01: 0.3 mg via INTRAVENOUS
  Administered 2014-08-01: 04:00:00 via INTRAVENOUS
  Administered 2014-08-01: 6.43 mg via INTRAVENOUS
  Administered 2014-08-01: 1 mg via INTRAVENOUS
  Administered 2014-08-01: 7.98 mg via INTRAVENOUS
  Administered 2014-08-01: 07:00:00 via INTRAVENOUS
  Administered 2014-08-01 – 2014-08-02 (×2): 2.99 mg via INTRAVENOUS
  Administered 2014-08-02: 01:00:00 via INTRAVENOUS
  Administered 2014-08-02: 4.49 mg via INTRAVENOUS
  Administered 2014-08-02 (×2): via INTRAVENOUS
  Administered 2014-08-02: 3.99 mg via INTRAVENOUS
  Administered 2014-08-02: 4.99 mg via INTRAVENOUS
  Administered 2014-08-02: 3.67 mg via INTRAVENOUS
  Administered 2014-08-03: 6.5 mg via INTRAVENOUS
  Administered 2014-08-03: 22:00:00 via INTRAVENOUS
  Administered 2014-08-03: 3.99 mg via INTRAVENOUS
  Administered 2014-08-03: 10:00:00 via INTRAVENOUS
  Administered 2014-08-03: 1.49 mg via INTRAVENOUS
  Administered 2014-08-03: 5.99 mg via INTRAVENOUS
  Administered 2014-08-03 (×2): via INTRAVENOUS
  Administered 2014-08-03: 4.49 mg via INTRAVENOUS
  Administered 2014-08-03: 1 mg via INTRAVENOUS
  Administered 2014-08-04: 5.49 mg via INTRAVENOUS
  Administered 2014-08-04 (×2): via INTRAVENOUS
  Administered 2014-08-04: 4.99 mg via INTRAVENOUS
  Administered 2014-08-04: 11:00:00 via INTRAVENOUS
  Administered 2014-08-04: 4.99 mg via INTRAVENOUS
  Administered 2014-08-04: 3.49 mg via INTRAVENOUS
  Administered 2014-08-04: 3.99 mg via INTRAVENOUS
  Administered 2014-08-04: 6.99 mg via INTRAVENOUS
  Administered 2014-08-05: 1.99 mg via INTRAVENOUS
  Administered 2014-08-05 (×2): via INTRAVENOUS
  Administered 2014-08-05: 6.95 mg via INTRAVENOUS
  Administered 2014-08-05: 4.49 mg via INTRAVENOUS
  Administered 2014-08-05: 0 mg via INTRAVENOUS
  Administered 2014-08-05: 20:00:00 via INTRAVENOUS
  Administered 2014-08-06: 5.49 mg via INTRAVENOUS
  Administered 2014-08-06: 6.49 mg via INTRAVENOUS
  Administered 2014-08-06: 06:00:00 via INTRAVENOUS
  Administered 2014-08-06: 5.49 mg via INTRAVENOUS
  Administered 2014-08-06: 2.99 mg via INTRAVENOUS
  Administered 2014-08-06: 13:00:00 via INTRAVENOUS
  Administered 2014-08-06: 8.19 mg via INTRAVENOUS
  Administered 2014-08-06: 21:00:00 via INTRAVENOUS
  Administered 2014-08-06: 1.63 mg via INTRAVENOUS
  Administered 2014-08-07: 3.49 mg via INTRAVENOUS
  Administered 2014-08-07: 19:00:00 via INTRAVENOUS
  Administered 2014-08-07: 3.99 mg via INTRAVENOUS
  Administered 2014-08-07: 11.2 mg via INTRAVENOUS
  Administered 2014-08-07: 08:00:00 via INTRAVENOUS
  Administered 2014-08-07: 3.49 mg via INTRAVENOUS
  Administered 2014-08-07: 03:00:00 via INTRAVENOUS
  Administered 2014-08-07: 1.99 mg via INTRAVENOUS
  Administered 2014-08-08: 4.49 mg via INTRAVENOUS
  Administered 2014-08-08: 4.66 mg via INTRAVENOUS
  Administered 2014-08-08: via INTRAVENOUS
  Administered 2014-08-08: 6.15 mg via INTRAVENOUS
  Administered 2014-08-08 (×2): via INTRAVENOUS
  Administered 2014-08-08: 5.17 mg via INTRAVENOUS
  Administered 2014-08-08: 5.49 mg via INTRAVENOUS
  Administered 2014-08-08 (×2): via INTRAVENOUS
  Administered 2014-08-08: 3.99 mg via INTRAVENOUS
  Administered 2014-08-09: 7.49 mg via INTRAVENOUS
  Administered 2014-08-09: 5.99 mg via INTRAVENOUS
  Administered 2014-08-09: 2.49 mg via INTRAVENOUS
  Administered 2014-08-09: 05:00:00 via INTRAVENOUS
  Administered 2014-08-09: 3.99 mg via INTRAVENOUS
  Administered 2014-08-09: 2.99 mg via INTRAVENOUS
  Administered 2014-08-09: 4.99 mg via INTRAVENOUS
  Administered 2014-08-09 (×2): via INTRAVENOUS
  Administered 2014-08-10: 2.9 mg via INTRAVENOUS
  Administered 2014-08-10: 02:00:00 via INTRAVENOUS
  Filled 2014-08-01 (×33): qty 25

## 2014-08-01 MED ORDER — ESCITALOPRAM OXALATE 10 MG PO TABS
10.0000 mg | ORAL_TABLET | Freq: Every day | ORAL | Status: DC
  Administered 2014-08-01 – 2014-08-11 (×11): 10 mg via ORAL
  Filled 2014-08-01 (×11): qty 1

## 2014-08-01 MED ORDER — NALOXONE HCL 0.4 MG/ML IJ SOLN: 0.4000 mg | INTRAMUSCULAR | Status: DC | PRN

## 2014-08-01 MED ORDER — SENNOSIDES-DOCUSATE SODIUM 8.6-50 MG PO TABS
1.0000 | ORAL_TABLET | Freq: Two times a day (BID) | ORAL | Status: DC
  Administered 2014-08-01 – 2014-08-11 (×20): 1 via ORAL
  Filled 2014-08-01 (×20): qty 1

## 2014-08-01 MED ORDER — POLYETHYLENE GLYCOL 3350 17 G PO PACK: 17.0000 g | PACK | Freq: Every day | ORAL | Status: DC | PRN

## 2014-08-01 MED ORDER — FOLIC ACID 1 MG PO TABS
1.0000 mg | ORAL_TABLET | Freq: Every day | ORAL | Status: DC
  Administered 2014-08-01 – 2014-08-11 (×11): 1 mg via ORAL
  Filled 2014-08-01 (×11): qty 1

## 2014-08-01 MED ORDER — ONDANSETRON HCL 4 MG/2ML IJ SOLN: 4.0000 mg | Freq: Four times a day (QID) | INTRAMUSCULAR | Status: DC | PRN

## 2014-08-01 NOTE — H&P (Signed)
[facility] History and Physical  Zedrick Springsteen CBJ:[id number] DOB: 08/20/1990 DOA: 07/31/2014  Referring physician: ED physician PCP: Ricke Hey, MD  Specialists:   Chief Complaint: Chest pain, back pain and leg pain  HPI: Timothy Marks is a 24 y.o. male with PMH of sickle cess disease, depression, osteo necrosis, who presents with lower back pain, chest pain and leg pain.  Patient reports having worsening and persistent leg pain, back pain and chest pain. No fever, cough or shortness of breath. Patient reports that he has been taking his iron Profen and Percocet at home without improvement.  In ED, patient was found to have WBC 13.8, negative urinalysis, temperature normal, positive UDS for THC and opiate, electrolytes okay. Chest x-ray is negative for acute abnormalities. Patient is admitted to inpatient for further evaluate and treatment.  Where does patient live?   At home  Can patient participate in ADLs?  Yes  Review of Systems:   General: no fevers, chills, no changes in body weight, has fatigue HEENT: no blurry vision, hearing changes or sore throat Pulm: no dyspnea, coughing, wheezing CV: has chest pain, no palpitations Abd: no nausea, vomiting, abdominal pain, diarrhea, constipation GU: no dysuria, burning on urination, increased urinary frequency, hematuria  Ext: has leg edema (R>L) Neuro: no unilateral weakness, numbness, or tingling, no vision change or hearing loss Skin: no rash MSK: No muscle spasm, no deformity, no limitation of range of movement in spin Heme: No easy bruising.  Travel history: No recent long distant travel.  Allergy:  Allergies  Allergen Reactions  . Levaquin [Levofloxacin] Itching  . Morphine And Related Hives  . Toradol [Ketorolac Tromethamine] Itching  . Tramadol Hives  . Vancomycin Itching  . Zosyn [Piperacillin Sod-Tazobactam So] Itching and Rash    Has taken rocephin in past    Past Medical History  Diagnosis Date   . Sickle cell anemia   . 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 Vitamin D deficiency y-6  . Depression     Past Surgical History  Procedure Laterality Date  . Cholecystectomy      Social History:  reports that he has never smoked. He has never used smokeless tobacco. He reports that he does not drink alcohol or use illicit drugs.  Family History:  Family History  Problem Relation Age of Onset  . Sickle cell trait Mother   . Sickle cell trait Father      Prior to Admission medications   Medication Sig Start Date End Date Taking? Authorizing Provider  folic acid (FOLVITE) 1 MG tablet Take 1 mg by mouth daily.   Yes Historical Provider, MD  hydroxyurea (HYDREA) 500 MG capsule Take 500 mg by mouth 2 (two) times daily. May take with food to minimize GI side effects.   Yes Historical Provider, MD  oxyCODONE-acetaminophen (PERCOCET) 10-325 MG per tablet Take 1 tablet by mouth every 4 (four) hours as needed for pain. 07/07/14  Yes Linton Flemings, MD  aspirin 81 MG chewable tablet Chew 1 tablet (81 mg total) by mouth daily. Patient not taking: Reported on 05/05/2014 04/09/14   Leana Gamer, MD  cholecalciferol (VITAMIN D) 1000 UNITS tablet Take 2 tablets (2,000 Units total) by mouth daily. Patient not taking: Reported on 05/05/2014 04/09/14   Leana Gamer, MD  diclofenac sodium (VOLTAREN) 1 % GEL Apply 2 g topically 4 (four) times daily. Patient not taking: Reported on 05/05/2014 04/09/14   Doyne Keel  Zigmund Daniel, MD  escitalopram (LEXAPRO) 10 MG tablet Take 1 tablet (10 mg total) by mouth daily. Patient not taking: Reported on 05/05/2014 04/09/14   Leana Gamer, MD  feeding supplement, ENSURE COMPLETE, (ENSURE COMPLETE) LIQD Take 237 mLs by mouth 3 (three) times daily between meals. Patient not taking: Reported on 05/05/2014 04/09/14   Leana Gamer, MD  hydrOXYzine (ATARAX/VISTARIL) 50 MG tablet Take 1 tablet (50 mg total)  by mouth at bedtime. Patient not taking: Reported on 05/05/2014 04/09/14   Leana Gamer, MD  polyethylene glycol [facility] / Floria Raveling) packet Take 17 g by mouth daily as needed for mild constipation. Patient not taking: Reported on 05/05/2014 04/09/14   Leana Gamer, MD  senna-docusate (SENOKOT-S) 8.6-50 MG per tablet Take 1 tablet by mouth 2 (two) times daily. Patient not taking: Reported on 05/05/2014 04/09/14   Leana Gamer, MD    Physical Exam: Filed Vitals:   08/01/14 0145 08/01/14 0200 08/01/14 0230 08/01/14 0332  BP: 143/78 159/72 130/73 136/70  Pulse: 81 91 67 88  Temp:    98.1 F (36.7 C)  TempSrc:    Oral  Resp: 12 23 13 14   Height:    6\' 2"  (1.88 m)  Weight:    56.518 kg (124 lb 9.6 oz)  SpO2: 100% 100% 100% 91%   General: In acute distress due to pain. Dry mucus and membrane HEENT:       Eyes: PERRL, EOMI, no scleral icterus.       ENT: No discharge from the ears and nose, no pharynx injection, no tonsillar enlargement.        Neck: No JVD, no bruit, no mass felt. Heme: No neck lymph node enlargement. Cardiac: S1/S2, RRR, No murmurs, No gallops or rubs. Pulm: No rales, wheezing, rhonchi or rubs. Abd: Soft, nondistended, nontender, no rebound pain, no organomegaly, BS present. Ext: No pitting leg edema bilaterally. 2+DP/PT pulse bilaterally. Musculoskeletal: has tenderness over chest wall, lower back and right leg. Skin: No rashes.  Neuro: Alert, oriented X3, cranial nerves II-XII grossly intact, muscle strength 5/5 in all extremities, sensation to light touch intact.  Psych: Patient is not psychotic, no suicidal or hemocidal ideation.  Labs on Admission:  Basic Metabolic Panel:  Recent Labs Lab 07/31/14 1045 08/01/14 0014  NA 136 138  K 3.7 3.8  CL 105 103  CO2 25  --   GLUCOSE 90 93  BUN 11 7  CREATININE 0.50* 0.60*  CALCIUM 8.4*  --    Liver Function Tests: No results for input(s): AST, ALT, ALKPHOS, BILITOT, PROT, ALBUMIN in the last  168 hours. No results for input(s): LIPASE, AMYLASE in the last 168 hours. No results for input(s): AMMONIA in the last 168 hours. CBC:  Recent Labs Lab 07/31/14 1045 07/31/14 2358 08/01/14 0014  WBC 16.9* 13.8*  --   NEUTROABS 13.2* 9.5*  --   HGB 9.6* 9.2* 10.9*  HCT 27.5* 26.6* 32.0*  MCV 85.1 84.7  --   PLT 485* 464*  --    Cardiac Enzymes: No results for input(s): CKTOTAL, CKMB, CKMBINDEX, TROPONINI in the last 168 hours.  BNP (last 3 results) No results for input(s): BNP in the last 8760 hours.  ProBNP (last 3 results) No results for input(s): PROBNP in the last 8760 hours.  CBG: No results for input(s): GLUCAP in the last 168 hours.  Radiological Exams on Admission: Dg Chest 2 View  08/01/2014   CLINICAL DATA:  Sickle cell crisis. Upper back  and chest pain, acute onset. Initial encounter.  EXAM: CHEST  2 VIEW  COMPARISON:  Chest radiograph performed 05/06/2014  FINDINGS: The lungs are well-aerated and clear. There is no evidence of focal opacification, pleural effusion or pneumothorax.  The heart is normal in size; the mediastinal contour is within normal limits. No acute osseous abnormalities are seen. Clips are noted within the right upper quadrant, reflecting prior cholecystectomy.  IMPRESSION: No acute cardiopulmonary process seen.   Electronically Signed   By: Garald Balding M.D.   On: 08/01/2014 00:17    EKG: Independently reviewed.  Abnormal findings: LVH.   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 Patient used high dose of PCA in the past. -will admit to med-surg bed -Start high dose PCA  protocol for pain: loading dose 1 mg, bolus dose 0.5 mg, lockout interval 10 min and one hour dose limit at 3 mg.  -Benadryl for itch -IVF: 1L NS, and then D5-1/2NS at 150 cc/h -Zofran for nausea  Leukocytosis: Likely due to stress-induced margination. No signs of infection. No need for antibodies -Follow-up CBC  Sickle cell anemia: Hgb stable. Hgb 7.0 on 07/07/14-->9.2, slightly higher Hgb is likely from hemoconcentration since patient is clinically dehydrated. -Folic acid and hydroxyurea  Protein-calorie malnutrition, severe: -Ensure  Depression and anxiety: No suicidal or homicidal ideations. patient is not taking his Lexapro -restart home medications: Lexapro   DVT ppx: SQ Heparin   Code Status: Full code Family Communication: None at bed side.         Disposition Plan: Admit to inpatient   Date of Service 08/01/2014    Ivor Costa [facility] Pager [phone]  If 7PM-7AM, please contact night-coverage [email or web address] Password [facility] 08/01/2014, 3:47 AM

## 2014-08-01 NOTE — ED Notes (Signed)
Patient transported to X-ray 

## 2014-08-01 NOTE — Plan of Care (Addendum)
Problem: Phase I Progression Outcomes Goal: Pt. aware of pain management plan Outcome: Progressing Patient requesting ketamine (had two doses in ED). Explained to patient that medication given in ED or by anesthesia only. Started on PCA. Patient complaining about CO2 monitor. Does not want to wear it and then complains that IV is beeping. Explained to him that it is policy while he is on PCA to have it own for [place] to monitor him and "keep him safe" with the medication.

## 2014-08-01 NOTE — Plan of Care (Signed)
Problem: Phase I Progression Outcomes Goal: Pain controlled with appropriate interventions Outcome: Progressing advil added today to regimen.

## 2014-08-01 NOTE — Progress Notes (Signed)
Patient refused heparin injections reporting his abdomen was "sore".

## 2014-08-01 NOTE — ED Provider Notes (Signed)
CSN: [id number]     Arrival date & time 07/31/14  2123 History   First MD Initiated Contact with Patient 07/31/14 2314     Chief Complaint  Patient presents with  . Chest Pain  . Back Pain  . Sickle Cell Pain Crisis     (Consider location/radiation/quality/duration/timing/severity/associated sxs/prior Treatment) HPI 24 year old male presents to the emergency department with complaint of continued sickle cell pain.  He reports he woke this morning with pain in his right leg.  He took his ibuprofen and Percocet with pain did not improve, he came to the emergency department.  Patient was seen and evaluated, given IV medications.  Patient reports he had relief of pain, but upon coming home, pain returned.  He denies any fever, chills, nausea, vomiting or diarrhea.  He reports central chest and upper back pain which he reports that he has frequently with sickle cell crises.  Patient is followed by Dr. Alyson Ingles. Past Medical History  Diagnosis Date  . Sickle cell anemia   . 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 Vitamin D deficiency y-6  . Depression    Past Surgical History  Procedure Laterality Date  . Cholecystectomy     Family History  Problem Relation Age of Onset  . Sickle cell trait Mother   . Sickle cell trait Father    History  Substance Use Topics  . Smoking status: Never Smoker   . Smokeless tobacco: Never Used  . Alcohol Use: No    Review of Systems   See History of Present Illness; otherwise all other systems are reviewed and negative  Allergies  Levaquin; Morphine and related; Toradol; Tramadol; Vancomycin; and Zosyn  Home Medications   Prior to Admission medications   Medication Sig Start Date End Date Taking? Authorizing Provider  folic acid (FOLVITE) 1 MG tablet Take 1 mg by mouth daily.   Yes Historical Provider, MD  hydroxyurea (HYDREA) 500 MG capsule Take 500 mg by mouth 2 (two) times daily. May  take with food to minimize GI side effects.   Yes Historical Provider, MD  oxyCODONE-acetaminophen (PERCOCET) 10-325 MG per tablet Take 1 tablet by mouth every 4 (four) hours as needed for pain. 07/07/14  Yes Linton Flemings, MD  aspirin 81 MG chewable tablet Chew 1 tablet (81 mg total) by mouth daily. Patient not taking: Reported on 05/05/2014 04/09/14   Leana Gamer, MD  cholecalciferol (VITAMIN D) 1000 UNITS tablet Take 2 tablets (2,000 Units total) by mouth daily. Patient not taking: Reported on 05/05/2014 04/09/14   Leana Gamer, MD  diclofenac sodium (VOLTAREN) 1 % GEL Apply 2 g topically 4 (four) times daily. Patient not taking: Reported on 05/05/2014 04/09/14   Leana Gamer, MD  escitalopram (LEXAPRO) 10 MG tablet Take 1 tablet (10 mg total) by mouth daily. Patient not taking: Reported on 05/05/2014 04/09/14   Leana Gamer, MD  feeding supplement, ENSURE COMPLETE, (ENSURE COMPLETE) LIQD Take 237 mLs by mouth 3 (three) times daily between meals. Patient not taking: Reported on 05/05/2014 04/09/14   Leana Gamer, MD  hydrOXYzine (ATARAX/VISTARIL) 50 MG tablet Take 1 tablet (50 mg total) by mouth at bedtime. Patient not taking: Reported on 05/05/2014 04/09/14   Leana Gamer, MD  polyethylene glycol [facility] / Floria Raveling) packet Take 17 g by mouth daily as needed for mild constipation. Patient not taking: Reported on 05/05/2014 04/09/14   Leana Gamer, MD  senna-docusate (  SENOKOT-S) 8.6-50 MG per tablet Take 1 tablet by mouth 2 (two) times daily. Patient not taking: Reported on 05/05/2014 04/09/14   Leana Gamer, MD   BP 143/78 mmHg  Pulse 81  Temp(Src) 98.6 F (37 C) (Oral)  Resp 12  SpO2 100% Physical Exam  Constitutional: He is oriented to person, place, and time. He appears well-developed and well-nourished. He appears distressed (tearful, appears to be in pain).  HENT:  Head: Normocephalic and atraumatic.  Nose: Nose normal.  Mouth/Throat:  Oropharynx is clear and moist.  Eyes: Conjunctivae and EOM are normal. Pupils are equal, round, and reactive to light.  Neck: Normal range of motion. Neck supple. No JVD present. No tracheal deviation present. No thyromegaly present.  Cardiovascular: Normal rate, regular rhythm, normal heart sounds and intact distal pulses.  Exam reveals no gallop and no friction rub.   No murmur heard. Pulmonary/Chest: Effort normal and breath sounds normal. No stridor. No respiratory distress. He has no wheezes. He has no rales. He exhibits tenderness (.  Patient with tenderness across anterior chest wall and posterior chest wall.  No step-off or crepitus).  Abdominal: Soft. Bowel sounds are normal. He exhibits no distension and no mass. There is no tenderness. There is no rebound and no guarding.  Musculoskeletal: Normal range of motion. He exhibits tenderness (tenderness along right leg from hip to toe.). He exhibits no edema.  Lymphadenopathy:    He has no cervical adenopathy.  Neurological: He is alert and oriented to person, place, and time. He displays normal reflexes. He exhibits normal muscle tone. Coordination normal.  Skin: Skin is warm and dry. No rash noted. No erythema. No pallor.  Psychiatric: He has a normal mood and affect. His behavior is normal. Judgment and thought content normal.  Nursing note and vitals reviewed.   ED Course  Procedures (including critical care time) Labs Review Labs Reviewed  CBC WITH DIFFERENTIAL/PLATELET - Abnormal; Notable for the following:    WBC 13.8 (*)    RBC 3.14 (*)    Hemoglobin 9.2 (*)    HCT 26.6 (*)    RDW 19.6 (*)    Platelets 464 (*)    Neutro Abs 9.5 (*)    Monocytes Absolute 1.7 (*)    All other components within normal limits  RETICULOCYTES - Abnormal; Notable for the following:    Retic Ct Pct 12.0 (*)    RBC. 3.14 (*)    Retic Count, Manual 376.8 (*)    All other components within normal limits  I-STAT CHEM 8, ED - Abnormal; Notable for  the following:    Creatinine, Ser 0.60 (*)    Hemoglobin 10.9 (*)    HCT 32.0 (*)    All other components within normal limits    Imaging Review Dg Chest 2 View  08/01/2014   CLINICAL DATA:  Sickle cell crisis. Upper back and chest pain, acute onset. Initial encounter.  EXAM: CHEST  2 VIEW  COMPARISON:  Chest radiograph performed 05/06/2014  FINDINGS: The lungs are well-aerated and clear. There is no evidence of focal opacification, pleural effusion or pneumothorax.  The heart is normal in size; the mediastinal contour is within normal limits. No acute osseous abnormalities are seen. Clips are noted within the right upper quadrant, reflecting prior cholecystectomy.  IMPRESSION: No acute cardiopulmonary process seen.   Electronically Signed   By: Garald Balding M.D.   On: 08/01/2014 00:17     EKG Interpretation   Date/Time:  Thursday July 31 2014 21:29:37 EDT Ventricular Rate:  80 PR Interval:  151 QRS Duration: 99 QT Interval:  357 QTC Calculation: 412 R Axis:   80 Text Interpretation:  Sinus rhythm Probable left atrial enlargement RSR'  in V1 or V2, probably normal variant Left ventricular hypertrophy Baseline  wander in lead(s) III aVL V5 No significant change since last tracing  Confirmed by Kinberly Perris  MD, Kijuana Ruppel ([id number]) on 07/31/2014 11:50:18 PM      MDM   Final diagnoses:  Chest pain  Sickle cell pain crisis    24 year old male with history of sickle cell disease who returns after being seen in the emergency department earlier today with continuing right lower leg pain, back pain and chest pain.  No fever, cough or shortness of breath to suggest acute Chest syndrome.  Patient reports that he has been taking his iron Profen and Percocet at home without improvement.  Patient appears to be in significant pain.  Patient has received 3 mg of Dilaudid, refused Toradol injection, and 2 rounds of ketamine without improvement.  Resolution of pain.  Will discuss with hospitalist for  admission.  Linton Flemings, MD 08/01/14 [phone]

## 2014-08-01 NOTE — Progress Notes (Signed)
Patient admitted to room 1340 from ED in crisis. Pain 10/10 in back/chest/r leg. To start PCA.

## 2014-08-01 NOTE — ED Notes (Signed)
MD at bedside. 

## 2014-08-01 NOTE — Progress Notes (Signed)
SICKLE CELL SERVICE PROGRESS NOTE  Timothy Marks FBP:[id number] DOB: 10-12-1990 DOA: 07/31/2014 PCP: Ricke Hey, MD   Consultants:  none  Procedures:  none  Antibiotics:  none  HPI/Subjective: Pt states that he is having pain mainly in his chest and legs that he rates as a 9/10. He is getting up to go to the bathroom and is eating and drinking well. His last bowel movement was this morning. He feels that his PCA is helping his pain.     Objective: Filed Vitals:   08/01/14 0800 08/01/14 0951 08/01/14 1132 08/01/14 1418  BP:  132/80  128/76  Pulse:  108  98  Temp:  98 F (36.7 C)  98.5 F (36.9 C)  TempSrc:  Oral  Oral  Resp: 16 10 10 16   Height:      Weight:      SpO2: 97% 98% 95% 93%   Weight change:   Intake/Output Summary (Last 24 hours) at 08/01/14 1511 Last data filed at 08/01/14 0813  Gross per 24 hour  Intake 2050.83 ml  Output   1975 ml  Net  75.83 ml    General: Alert, awake, oriented x3, in no acute distress.  HEENT: [place]/AT PEERL, EOMI Neck: Trachea midline,  no masses or lymphadenopathy OROPHARYNX:  Moist, No exudate/ erythema/lesions.  Heart: Regular rate and rhythm, without murmurs, rubs, gallops Lungs: Clear to auscultation, no wheezing or rhonchi noted.  Abdomen: Soft, nontender, nondistended, positive bowel sounds, no masses no hepatosplenomegaly noted. Neuro: No focal neurological deficits noted. Musculoskeletal: No warm swelling or erythema around joints, no spinal tenderness noted. Psychiatric: Patient alert and oriented x3, good insight and cognition, good recent to remote recall. Extremities: no clubbing, cyanosis, or edema  Data Reviewed: Basic Metabolic Panel:  Recent Labs Lab 07/31/14 1045 08/01/14 0014 08/01/14 0515  NA 136 138 137  K 3.7 3.8 3.8  CL 105 103 103  CO2 25  --  27  GLUCOSE 90 93 107*  BUN 11 7 6   CREATININE 0.50* 0.60* 0.42*  CALCIUM 8.4*  --  8.8*   Liver Function Tests:  Recent Labs Lab  08/01/14 0515  AST 27  ALT 12*  ALKPHOS 66  BILITOT 6.7*  PROT 7.0  ALBUMIN 3.9   No results for input(s): LIPASE, AMYLASE in the last 168 hours. No results for input(s): AMMONIA in the last 168 hours. CBC:  Recent Labs Lab 07/31/14 1045 07/31/14 2358 08/01/14 0014 08/01/14 0515  WBC 16.9* 13.8*  --  13.3*  NEUTROABS 13.2* 9.5*  --   --   HGB 9.6* 9.2* 10.9* 8.9*  HCT 27.5* 26.6* 32.0* 26.1*  MCV 85.1 84.7  --  86.1  PLT 485* 464*  --  463*   Cardiac Enzymes: No results for input(s): CKTOTAL, CKMB, CKMBINDEX, TROPONINI in the last 168 hours. BNP (last 3 results) No results for input(s): BNP in the last 8760 hours.  ProBNP (last 3 results) No results for input(s): PROBNP in the last 8760 hours.  CBG: No results for input(s): GLUCAP in the last 168 hours.  No results found for this or any previous visit (from the past 240 hour(s)).   Studies: Dg Chest 2 View  08/01/2014   CLINICAL DATA:  Sickle cell crisis. Upper back and chest pain, acute onset. Initial encounter.  EXAM: CHEST  2 VIEW  COMPARISON:  Chest radiograph performed 05/06/2014  FINDINGS: The lungs are well-aerated and clear. There is no evidence of focal opacification, pleural effusion or pneumothorax.  The  heart is normal in size; the mediastinal contour is within normal limits. No acute osseous abnormalities are seen. Clips are noted within the right upper quadrant, reflecting prior cholecystectomy.  IMPRESSION: No acute cardiopulmonary process seen.   Electronically Signed   By: Garald Balding M.D.   On: 08/01/2014 00:17    Scheduled Meds: . aspirin  81 mg Oral Daily  . cholecalciferol  2,000 Units Oral Daily  . escitalopram  10 mg Oral Daily  . feeding supplement (ENSURE ENLIVE)  237 mL Oral TID BM  . folic acid  1 mg Oral Daily  . heparin  5,000 Units Subcutaneous 3 times per day  . HYDROmorphone PCA 0.3 mg/mL   Intravenous 6 times per day  . hydroxyurea  500 mg Oral BID  . ibuprofen  600 mg Oral 3  times per day  . senna-docusate  1 tablet Oral BID   Continuous Infusions: . 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 He is opioid naive as he does not take his Percocet on daily basis. Pt's pain is a 9/10. He used 16 mg of Dilaudid since being started on PCA this morning.. Will continue Dilaudid PCA at current dose. Start ibuprofen 600mg  q8h for anti-inflammation.  2) Anemia: Hgb stable at 8.9. His baseline is ~8-9. Will continue to monitor. Transfuse if hgb<6 or symptomatic.   3)Sickle Cell Care: Continue Folic acid and Hydrea.  4)Leukocytosis: WBC of 13. Likely reflective of crisis. No signs of infection. Continue to monitor.  5) [id number] Lexapro  6) FEN/GI : Regular Diet, continue vit D IV fluids-D5/0.45% @150cc /hr  Bowel regimen in place   Code Status: Full  DVT Prophylaxis: heparin [place] Family Communication: none  Disposition Plan: pending further improvement  Time spent: 35 min  Kalman Shan  Pager [phone]. If 7PM-7AM, please contact night-coverage.  08/01/2014, 3:11 PM  LOS: 0 days   Kalman Shan

## 2014-08-02 MED ORDER — HYDROMORPHONE HCL 2 MG/ML IJ SOLN
2.0000 mg | INTRAMUSCULAR | Status: AC | PRN
  Administered 2014-08-03: 2 mg via INTRAVENOUS

## 2014-08-02 NOTE — Progress Notes (Signed)
Patient asleep so did not give extra dilaudid. Will monitor.

## 2014-08-02 NOTE — Progress Notes (Addendum)
Patient will not keep CO2 monitor on. Have assisted patient in putting it on and asked patient numerous times to put it on (which he did sometimes and at others refused to put on) but within 30 minutes or less it is off again. Patient has been sleeping at times when he removes it. Have told patient it is policy to wear it with the PCA.

## 2014-08-02 NOTE — Progress Notes (Addendum)
Patient requesting additional pain med. Still asking about Ketamine. 10/10 pain. Minimal use with PCA. Paged NP on call and orders received. Patient wanting something to "sedate me".

## 2014-08-02 NOTE — Progress Notes (Signed)
Patient ID: Timothy Marks, male   DOB: 13-Jun-1990, 24 y.o.   MRN: [id number]  SICKLE CELL SERVICE PROGRESS NOTE  Timothy Marks [id number] DOB: Nov 11, 1990 DOA: 07/31/2014 PCP: Ricke Hey, MD   Consultants:  none  Procedures:  none  Antibiotics:  none  HPI/Subjective: Pt states that he is still having lots of pain that feels like it is across his chest. He rates pain as a 9/10. He has had difficulty complying with the PCA last night but is better with use this morning. He is getting up to go to the bathroom and is eating and drinking well.   Objective: Filed Vitals:   08/02/14 0514 08/02/14 0520 08/02/14 0605 08/02/14 0807  BP:      Pulse: 80 95 90   Temp:      TempSrc:      Resp:  17  11  Height:      Weight:      SpO2: 96% 97% 96% 96%   Weight change:   Intake/Output Summary (Last 24 hours) at 08/02/14 1133 Last data filed at 08/02/14 0747  Gross per 24 hour  Intake 4170.3 ml  Output   3625 ml  Net  545.3 ml    General: Alert, awake, oriented x3, in no acute distress.  HEENT: [place]/AT PEERL, EOMI Neck: Trachea midline,  no masses or lymphadenopathy OROPHARYNX:  Moist, No exudate/ erythema/lesions.  Heart: Regular rate and rhythm, without murmurs, rubs, gallops Lungs: Clear to auscultation, no wheezing or rhonchi noted.  Abdomen: Soft, nontender, nondistended, positive bowel sounds, no masses no hepatosplenomegaly noted. Neuro: No focal neurological deficits noted. Musculoskeletal: No warm swelling or erythema around joints, no spinal tenderness noted. Psychiatric: Patient alert and oriented x3, good insight and cognition, good recent to remote recall. Extremities: no clubbing, cyanosis, or edema -unchanged  Data Reviewed: Basic Metabolic Panel:  Recent Labs Lab 07/31/14 1045 08/01/14 0014 08/01/14 0515  NA 136 138 137  K 3.7 3.8 3.8  CL 105 103 103  CO2 25  --  27  GLUCOSE 90 93 107*  BUN 11 7 6   CREATININE 0.50* 0.60* 0.42*  CALCIUM 8.4*   --  8.8*   Liver Function Tests:  Recent Labs Lab 08/01/14 0515  AST 27  ALT 12*  ALKPHOS 66  BILITOT 6.7*  PROT 7.0  ALBUMIN 3.9   No results for input(s): LIPASE, AMYLASE in the last 168 hours. No results for input(s): AMMONIA in the last 168 hours. CBC:  Recent Labs Lab 07/31/14 1045 07/31/14 2358 08/01/14 0014 08/01/14 0515  WBC 16.9* 13.8*  --  13.3*  NEUTROABS 13.2* 9.5*  --   --   HGB 9.6* 9.2* 10.9* 8.9*  HCT 27.5* 26.6* 32.0* 26.1*  MCV 85.1 84.7  --  86.1  PLT 485* 464*  --  463*   Cardiac Enzymes: No results for input(s): CKTOTAL, CKMB, CKMBINDEX, TROPONINI in the last 168 hours. BNP (last 3 results) No results for input(s): BNP in the last 8760 hours.  ProBNP (last 3 results) No results for input(s): PROBNP in the last 8760 hours.  CBG: No results for input(s): GLUCAP in the last 168 hours.  No results found for this or any previous visit (from the past 240 hour(s)).   Studies: Dg Chest 2 View  08/01/2014   CLINICAL DATA:  Sickle cell crisis. Upper back and chest pain, acute onset. Initial encounter.  EXAM: CHEST  2 VIEW  COMPARISON:  Chest radiograph performed 05/06/2014  FINDINGS: The lungs are well-aerated  and clear. There is no evidence of focal opacification, pleural effusion or pneumothorax.  The heart is normal in size; the mediastinal contour is within normal limits. No acute osseous abnormalities are seen. Clips are noted within the right upper quadrant, reflecting prior cholecystectomy.  IMPRESSION: No acute cardiopulmonary process seen.   Electronically Signed   By: Garald Balding M.D.   On: 08/01/2014 00:17    Scheduled Meds: . aspirin  81 mg Oral Daily  . cholecalciferol  2,000 Units Oral Daily  . escitalopram  10 mg Oral Daily  . feeding supplement (ENSURE ENLIVE)  237 mL Oral TID BM  . folic acid  1 mg Oral Daily  . heparin  5,000 Units Subcutaneous 3 times per day  .  HYDROmorphone (DILAUDID) injection  2 mg Intravenous Once  .  HYDROmorphone PCA 0.3 mg/mL   Intravenous 6 times per day  . hydroxyurea  500 mg Oral BID  . ibuprofen  600 mg Oral 3 times per day  . senna-docusate  1 tablet Oral BID   Continuous Infusions: . 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 He is opioid naive as he does not take his Percocet on daily basis. Pt's pain is a 9/10. He used 27 mg of Dilaudid in 24 hours Will continue Dilaudid PCA at current dose. Start physician assisted dosing of Dilaudid IV bolus 2mg  q4h PRN for severe pain. Continue ibuprofen 600mg  q8h for anti-inflammation.  2) Anemia: Hgb stable at 8.9 yesterday. His baseline is ~8-9. Will continue to monitor with recheck tomorrow morning. Transfuse if hgb<6 or symptomatic.   3)Sickle Cell Care: Continue Folic acid and Hydrea.  4)Leukocytosis: WBC of 13. Likely reflective of crisis. No signs of infection. Continue to monitor.  5) TDD:UKGURKYH Lexapro  6) FEN/GI : Regular Diet, continue vit D IV fluids-D5/0.45% will decrease to 75cc/hr as pt is eating and drinking better. Bowel regimen in place   Code Status: Full  DVT Prophylaxis:  heparin [place] Family Communication: none  Disposition Plan: pending further improvement  Time spent: 35 min  Kalman Shan  Pager [phone]. If 7PM-7AM, please contact night-coverage.  08/02/2014, 11:33 AM  LOS: 1 day   Kalman Shan

## 2014-08-03 DIAGNOSIS — K5900 Constipation, unspecified: Secondary | ICD-10-CM

## 2014-08-03 LAB — CBC WITH DIFFERENTIAL/PLATELET
Basophils Absolute: 0 10*3/uL (ref 0.0–0.1)
Basophils Relative: 0 % (ref 0–1)
Eosinophils Absolute: 0.2 10*3/uL (ref 0.0–0.7)
Eosinophils Relative: 2 % (ref 0–5)
HCT: 20.6 % — ABNORMAL LOW (ref 39.0–52.0)
Hemoglobin: 7.1 g/dL — ABNORMAL LOW (ref 13.0–17.0)
Lymphocytes Relative: 21 % (ref 12–46)
Lymphs Abs: 2.9 10*3/uL (ref 0.7–4.0)
MCH: 29.7 pg (ref 26.0–34.0)
MCHC: 34.5 g/dL (ref 30.0–36.0)
MCV: 86.2 fL (ref 78.0–100.0)
Monocytes Absolute: 0.9 10*3/uL (ref 0.1–1.0)
Monocytes Relative: 7 % (ref 3–12)
Neutro Abs: 9.4 10*3/uL — ABNORMAL HIGH (ref 1.7–7.7)
Neutrophils Relative %: 70 % (ref 43–77)
Platelets: 427 10*3/uL — ABNORMAL HIGH (ref 150–400)
RBC: 2.39 MIL/uL — ABNORMAL LOW (ref 4.22–5.81)
RDW: 19.2 % — ABNORMAL HIGH (ref 11.5–15.5)
WBC: 13.5 10*3/uL — ABNORMAL HIGH (ref 4.0–10.5)

## 2014-08-03 MED ORDER — NALOXEGOL OXALATE 25 MG PO TABS
25.0000 mg | ORAL_TABLET | Freq: Once | ORAL | Status: AC
  Administered 2014-08-03: 25 mg via ORAL
  Filled 2014-08-03: qty 1

## 2014-08-03 MED ORDER — NALOXEGOL OXALATE 25 MG PO TABS
25.0000 mg | ORAL_TABLET | Freq: Every day | ORAL | Status: DC
  Filled 2014-08-03: qty 1

## 2014-08-03 NOTE — Progress Notes (Signed)
Patient ID: Timothy Marks, male   DOB: [birth year], 24 y.o.   MRN: [id number]  SICKLE CELL SERVICE PROGRESS NOTE  Timothy Marks [id number] DOB: Feb 12, 1991 DOA: 07/31/2014 PCP: Timothy Hey, MD   Consultants:  none  Procedures:  none  Antibiotics:  none  HPI/Subjective: Pt states that he is still having pain over his chest and right leg. He rates pain as a 7/10. It was reported that he was making belligerent statements to the nurses over night. Pt denies making crude comments. His last bowel movement was 3 days ago.  Objective: Filed Vitals:   08/03/14 0510 08/03/14 0721 08/03/14 0945 08/03/14 1200  BP: 113/52  111/59   Pulse: 69  89   Temp: 97.9 F (36.6 C)  98.1 F (36.7 C)   TempSrc: Oral  Oral   Resp: 12 12 12 14   Height:      Weight: 133 lb 11.2 oz (60.646 kg)     SpO2: 98% 100% 98% 100%   Weight change:   Intake/Output Summary (Last 24 hours) at 08/03/14 1306 Last data filed at 08/03/14 0900  Gross per 24 hour  Intake 3097.9 ml  Output   1000 ml  Net 2097.9 ml    General: Alert, awake, oriented x3, in no acute distress.  HEENT: [place]/AT PEERL, EOMI Neck: Trachea midline,  no masses or lymphadenopathy OROPHARYNX:  Moist, No exudate/ erythema/lesions.  Heart: Regular rate and rhythm, without murmurs, rubs, gallops Lungs: Clear to auscultation, no wheezing or rhonchi noted.  Abdomen: Soft, nontender, nondistended, positive bowel sounds, no masses no hepatosplenomegaly noted. Neuro: No focal neurological deficits noted. Musculoskeletal: No warmth, swelling or erythema around joints, no spinal tenderness noted. Right leg is tender over anterior thigh. Psychiatric: Patient alert and oriented x3, good insight and cognition, good recent to remote recall. Extremities: no clubbing, cyanosis, or edema   Data Reviewed: Basic Metabolic Panel:  Recent Labs Lab 07/31/14 1045 08/01/14 0014 08/01/14 0515  NA 136 138 137  K 3.7 3.8 3.8  CL 105 103 103  CO2  25  --  27  GLUCOSE 90 93 107*  BUN 11 7 6   CREATININE 0.50* 0.60* 0.42*  CALCIUM 8.4*  --  8.8*   Liver Function Tests:  Recent Labs Lab 08/01/14 0515  AST 27  ALT 12*  ALKPHOS 66  BILITOT 6.7*  PROT 7.0  ALBUMIN 3.9   No results for input(s): LIPASE, AMYLASE in the last 168 hours. No results for input(s): AMMONIA in the last 168 hours. CBC:  Recent Labs Lab 07/31/14 1045 07/31/14 2358 08/01/14 0014 08/01/14 0515 08/03/14 0433  WBC 16.9* 13.8*  --  13.3* 13.5*  NEUTROABS 13.2* 9.5*  --   --  9.4*  HGB 9.6* 9.2* 10.9* 8.9* 7.1*  HCT 27.5* 26.6* 32.0* 26.1* 20.6*  MCV 85.1 84.7  --  86.1 86.2  PLT 485* 464*  --  463* 427*   Cardiac Enzymes: No results for input(s): CKTOTAL, CKMB, CKMBINDEX, TROPONINI in the last 168 hours. BNP (last 3 results) No results for input(s): BNP in the last 8760 hours.  ProBNP (last 3 results) No results for input(s): PROBNP in the last 8760 hours.  CBG: No results for input(s): GLUCAP in the last 168 hours.  No results found for this or any previous visit (from the past 240 hour(s)).   Studies: Dg Chest 2 View  08/01/2014   CLINICAL DATA:  Sickle cell crisis. Upper back and chest pain, acute onset. Initial encounter.  EXAM: CHEST  2 VIEW  COMPARISON:  Chest radiograph performed 05/06/2014  FINDINGS: The lungs are well-aerated and clear. There is no evidence of focal opacification, pleural effusion or pneumothorax.  The heart is normal in size; the mediastinal contour is within normal limits. No acute osseous abnormalities are seen. Clips are noted within the right upper quadrant, reflecting prior cholecystectomy.  IMPRESSION: No acute cardiopulmonary process seen.   Electronically Signed   By: Garald Balding M.D.   On: 08/01/2014 00:17    Scheduled Meds: . aspirin  81 mg Oral Daily  . cholecalciferol  2,000 Units Oral Daily  . escitalopram  10 mg Oral Daily  . feeding supplement (ENSURE ENLIVE)  237 mL Oral TID BM  . folic acid  1  mg Oral Daily  . heparin  5,000 Units Subcutaneous 3 times per day  . HYDROmorphone PCA 0.3 mg/mL   Intravenous 6 times per day  . hydroxyurea  500 mg Oral BID  . ibuprofen  600 mg Oral 3 times per day  . naloxegol oxalate  25 mg Oral Daily  . senna-docusate  1 tablet Oral BID   Continuous Infusions: . 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 Pt's pain is improving and he rates it as a 7/10. He used ~22.5 mg of Dilaudid in 24 hours Will continue Dilaudid PCA at current dose. Discontinue physician assisted dosing of Dilaudid IV bolus 2mg  q4h PRN, as pt has only used it once and is not maximizing his PCA. Pt encouraged to push PCA. Continue ibuprofen 600mg  q8h for antiinflammation.  2) Anemia: Hgb stable at decreased to 7.1. His baseline is ~8-9. Will continue to monitor with recheck tomorrow morning. Transfuse if hgb<6 or symptomatic.   3)Sickle Cell Care: Continue Folic acid and Hydrea.  4)Leukocytosis: WBC stable at 13. Likely reflective of crisis. No signs of infection. Continue to monitor.  5) TIW:[id number]  Lexapro  6)Constipation: Pt is constipated likely as a side effect from chronic opioid use. Will give Movantik.  7) FEN/GI : Regular Diet, continue vit D IV fluids-D5/0.45% @ 75cc/hr as pt is eating and drinking better but needs to improve intake. Bowel regimen in place   Code Status: Full  DVT Prophylaxis: heparin [place] Family Communication: none  Disposition Plan: pending further improvement  Time spent: 35 min  Kalman Shan  Pager [phone]. If 7PM-7AM, please contact night-coverage.  08/03/2014, 1:06 PM  LOS: 2 days   Kalman Shan

## 2014-08-04 LAB — BASIC METABOLIC PANEL
Anion gap: 6 (ref 5–15)
BUN: 9 mg/dL (ref 6–20)
CO2: 31 mmol/L (ref 22–32)
Calcium: 8.9 mg/dL (ref 8.9–10.3)
Chloride: 103 mmol/L (ref 101–111)
Creatinine, Ser: 0.56 mg/dL — ABNORMAL LOW (ref 0.61–1.24)
GFR calc Af Amer: >60 mL/min (ref >60)
GFR calc non Af Amer: >60 mL/min (ref >60)
Glucose, Bld: 108 mg/dL — ABNORMAL HIGH (ref 65–99)
Potassium: 3.7 mmol/L (ref 3.5–5.1)
Sodium: 140 mmol/L (ref 135–145)

## 2014-08-04 LAB — CBC WITH DIFFERENTIAL/PLATELET
Basophils Absolute: 0 10*3/uL (ref 0.0–0.1)
Basophils Relative: 0 % (ref 0–1)
Eosinophils Absolute: 0.3 10*3/uL (ref 0.0–0.7)
Eosinophils Relative: 3 % (ref 0–5)
HCT: 20 % — ABNORMAL LOW (ref 39.0–52.0)
Hemoglobin: 6.7 g/dL — CL (ref 13.0–17.0)
Lymphocytes Relative: 20 % (ref 12–46)
Lymphs Abs: 2.6 10*3/uL (ref 0.7–4.0)
MCH: 28.9 pg (ref 26.0–34.0)
MCHC: 33.5 g/dL (ref 30.0–36.0)
MCV: 86.2 fL (ref 78.0–100.0)
Monocytes Absolute: 1.1 10*3/uL — ABNORMAL HIGH (ref 0.1–1.0)
Monocytes Relative: 9 % (ref 3–12)
Neutro Abs: 8.6 10*3/uL — ABNORMAL HIGH (ref 1.7–7.7)
Neutrophils Relative %: 68 % (ref 43–77)
Platelets: 432 10*3/uL — ABNORMAL HIGH (ref 150–400)
RBC: 2.32 MIL/uL — ABNORMAL LOW (ref 4.22–5.81)
RDW: 20 % — ABNORMAL HIGH (ref 11.5–15.5)
WBC: 12.5 10*3/uL — ABNORMAL HIGH (ref 4.0–10.5)

## 2014-08-04 LAB — LACTATE DEHYDROGENASE: LDH: 255 U/L — ABNORMAL HIGH (ref 98–192)

## 2014-08-04 LAB — RETICULOCYTES
RBC.: 2.32 MIL/uL — ABNORMAL LOW (ref 4.22–5.81)
Retic Count, Absolute: 355 10*3/uL — ABNORMAL HIGH (ref 19.0–186.0)
Retic Ct Pct: 15.3 % — ABNORMAL HIGH (ref 0.4–3.1)

## 2014-08-04 MED ORDER — OXYCODONE HCL 5 MG PO TABS
5.0000 mg | ORAL_TABLET | ORAL | Status: DC
  Administered 2014-08-04 – 2014-08-11 (×39): 5 mg via ORAL
  Filled 2014-08-04 (×41): qty 1

## 2014-08-04 MED ORDER — OXYCODONE-ACETAMINOPHEN 5-325 MG PO TABS
1.0000 | ORAL_TABLET | ORAL | Status: DC
  Administered 2014-08-04 – 2014-08-11 (×39): 1 via ORAL
  Filled 2014-08-04 (×41): qty 1

## 2014-08-04 MED ORDER — NALOXEGOL OXALATE 25 MG PO TABS
25.0000 mg | ORAL_TABLET | Freq: Every day | ORAL | Status: DC
  Administered 2014-08-04 – 2014-08-11 (×8): 25 mg via ORAL
  Filled 2014-08-04 (×8): qty 1

## 2014-08-04 MED ORDER — FAMOTIDINE 20 MG PO TABS
20.0000 mg | ORAL_TABLET | Freq: Two times a day (BID) | ORAL | Status: DC
  Administered 2014-08-04 – 2014-08-11 (×14): 20 mg via ORAL
  Filled 2014-08-04 (×15): qty 1

## 2014-08-04 MED ORDER — ZOLPIDEM TARTRATE 5 MG PO TABS
5.0000 mg | ORAL_TABLET | Freq: Every evening | ORAL | Status: DC | PRN
  Administered 2014-08-04: 5 mg via ORAL
  Filled 2014-08-04: qty 1

## 2014-08-04 NOTE — Progress Notes (Signed)
CSW received notification that pt requesting information about [employer].  CSW provided pt multiple resources at bedside surrounding housing resources. Pt states that he has a safe place to discharge to, but his lease at his apartment will be up at the end of July and pt hopeful to find another housing option.   Pt appreciative of resources.  No further social work needs identified at this time.  CSW signing off.   Alison Murray, MSW, [facility] [phone]

## 2014-08-04 NOTE — Progress Notes (Addendum)
Patient ID: Timothy Marks, male   DOB: 07/27/1990, 24 y.o.   MRN: [id number]  SICKLE CELL SERVICE PROGRESS NOTE  Ata Pecha [id number]:[id number] DOB: 1990-03-17 DOA: 07/31/2014 PCP: Ricke Hey, MD   Consultants:  none  Procedures:  none  Antibiotics:  none  HPI/Subjective: Pt states that he is still having pain over his chest and right leg. He rates pain as a 9/10 today although he reports it improving. Pt is eating but not eating full meals. He still has not had a bowel movement. States that he does not feel constipated.  Objective: Filed Vitals:   08/04/14 0018 08/04/14 0453 08/04/14 0514 08/04/14 0746  BP:   97/50   Pulse:   75   Temp:   98.2 F (36.8 C)   TempSrc:   Oral   Resp: 12 10 10 9   Height:      Weight:      SpO2: 99% 100% 98% 99%   Weight change:   Intake/Output Summary (Last 24 hours) at 08/04/14 1041 Last data filed at 08/04/14 0649  Gross per 24 hour  Intake 2535.8 ml  Output   2250 ml  Net  285.8 ml    General: Alert, awake, very thin, oriented x3, in no acute distress but does appear to have discomfort with movement. HEENT: [place]/AT PEERL, EOMI Neck: Trachea midline,  no masses or lymphadenopathy OROPHARYNX:  Moist, No exudate/ erythema/lesions.  Heart: Regular rate and rhythm, without murmurs, rubs, gallops Lungs: Clear to auscultation, no wheezing or rhonchi noted.  Abdomen: Soft, nontender, non-distended but concaved, positive bowel sounds, no masses no hepatosplenomegaly noted. Neuro: No focal neurological deficits noted. Able to walk but limps slightly with right leg due to pain. Musculoskeletal: No warmth, swelling or erythema around joints, no spinal tenderness noted. Right leg is tender over anterior thigh. Psychiatric: Patient alert and oriented x3, good insight and cognition, good recent to remote recall. Extremities: no clubbing, cyanosis, or edema   Data Reviewed: Basic Metabolic Panel:  Recent Labs Lab 07/31/14 1045  08/01/14 0014 08/01/14 0515 08/04/14 0440  NA 136 138 137 140  K 3.7 3.8 3.8 3.7  CL 105 103 103 103  CO2 25  --  27 31  GLUCOSE 90 93 107* 108*  BUN 11 7 6 9   CREATININE 0.50* 0.60* 0.42* 0.56*  CALCIUM 8.4*  --  8.8* 8.9   Liver Function Tests:  Recent Labs Lab 08/01/14 0515  AST 27  ALT 12*  ALKPHOS 66  BILITOT 6.7*  PROT 7.0  ALBUMIN 3.9   No results for input(s): LIPASE, AMYLASE in the last 168 hours. No results for input(s): AMMONIA in the last 168 hours. CBC:  Recent Labs Lab 07/31/14 1045 07/31/14 2358 08/01/14 0014 08/01/14 0515 08/03/14 0433 08/04/14 0440  WBC 16.9* 13.8*  --  13.3* 13.5* 12.5*  NEUTROABS 13.2* 9.5*  --   --  9.4* 8.6*  HGB 9.6* 9.2* 10.9* 8.9* 7.1* 6.7*  HCT 27.5* 26.6* 32.0* 26.1* 20.6* 20.0*  MCV 85.1 84.7  --  86.1 86.2 86.2  PLT 485* 464*  --  463* 427* 432*   Cardiac Enzymes: No results for input(s): CKTOTAL, CKMB, CKMBINDEX, TROPONINI in the last 168 hours. BNP (last 3 results) No results for input(s): BNP in the last 8760 hours.  ProBNP (last 3 results) No results for input(s): PROBNP in the last 8760 hours.  CBG: No results for input(s): GLUCAP in the last 168 hours.  No results found for this or any previous  visit (from the past 240 hour(s)).   Studies: Dg Chest 2 View  08/01/2014   CLINICAL DATA:  Sickle cell crisis. Upper back and chest pain, acute onset. Initial encounter.  EXAM: CHEST  2 VIEW  COMPARISON:  Chest radiograph performed 05/06/2014  FINDINGS: The lungs are well-aerated and clear. There is no evidence of focal opacification, pleural effusion or pneumothorax.  The heart is normal in size; the mediastinal contour is within normal limits. No acute osseous abnormalities are seen. Clips are noted within the right upper quadrant, reflecting prior cholecystectomy.  IMPRESSION: No acute cardiopulmonary process seen.   Electronically Signed   By: Garald Balding M.D.   On: 08/01/2014 00:17    Scheduled Meds: .  aspirin  81 mg Oral Daily  . cholecalciferol  2,000 Units Oral Daily  . escitalopram  10 mg Oral Daily  . feeding supplement (ENSURE ENLIVE)  237 mL Oral TID BM  . folic acid  1 mg Oral Daily  . heparin  5,000 Units Subcutaneous 3 times per day  . HYDROmorphone PCA 0.3 mg/mL   Intravenous 6 times per day  . hydroxyurea  500 mg Oral BID  . ibuprofen  600 mg Oral 3 times per day  . senna-docusate  1 tablet Oral BID   Continuous Infusions: . 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 Pt's pain is slowly improving based on function as he was willing to get up and walk for me today. He used ~31 mg of Dilaudid in 24 hours. Will continue Dilaudid PCA at current dose and start his home Percocet 325/10mg  q4h in order to transition to oral pain meds. Continue ibuprofen 600mg  q8h for antiinflammation.  2) Anemia: Hgb stable has decreased further from 7.1 to 6.7. He remains asymptomatic. His baseline is ~8-9. Will continue to monitor with recheck tomorrow morning. Transfuse if hgb<6 or symptomatic.   3)Sickle Cell Care: Continue Folic acid and  Hydrea.  4)Leukocytosis: WBC decreased from 13.5 -->12.5. Likely reflective of improvement of his crisis. No signs of infection. Continue to monitor.  5) JOI:NOMVEHMC Lexapro  6)Constipation: Pt has been without a bowel movement for several days. Pt is eating but after being given Movantik 25mg X1, he still did not have a bowel movement. Pt encouraged to eat and drink. Will schedule Movantik 25mg  daily.  7) FEN/GI : Regular Diet, continue vit D Shake supplements IV fluids-D5/0.45% @ 75cc/hr Bowel regimen in place  Start Famotidine 20mg  BID for GERD prophylaxis as pt is being continued on ibuprofen.  Code Status: Full  DVT Prophylaxis: heparin [place] Family Communication: none  Disposition Plan: home in 1-2 days.  Time spent: 35 min  Val Schiavo  Pager [phone]. If 7PM-7AM, please contact night-coverage.  08/04/2014, 10:41 AM  LOS: 3 days   Kalman Shan

## 2014-08-05 ENCOUNTER — Inpatient Hospital Stay (HOSPITAL_COMMUNITY): Payer: Medicaid Other

## 2014-08-05 DIAGNOSIS — J189 Pneumonia, unspecified organism: Secondary | ICD-10-CM

## 2014-08-05 DIAGNOSIS — D638 Anemia in other chronic diseases classified elsewhere: Secondary | ICD-10-CM

## 2014-08-05 DIAGNOSIS — K5909 Other constipation: Secondary | ICD-10-CM

## 2014-08-05 LAB — CBC WITH DIFFERENTIAL/PLATELET
Basophils Absolute: 0 10*3/uL (ref 0.0–0.1)
Basophils Relative: 0 % (ref 0–1)
Eosinophils Absolute: 0.5 10*3/uL (ref 0.0–0.7)
Eosinophils Relative: 4 % (ref 0–5)
HCT: 18.6 % — ABNORMAL LOW (ref 39.0–52.0)
Hemoglobin: 6.2 g/dL — CL (ref 13.0–17.0)
Lymphocytes Relative: 28 % (ref 12–46)
Lymphs Abs: 3.2 10*3/uL (ref 0.7–4.0)
MCH: 28.6 pg (ref 26.0–34.0)
MCHC: 33.3 g/dL (ref 30.0–36.0)
MCV: 85.7 fL (ref 78.0–100.0)
Monocytes Absolute: 0.9 10*3/uL (ref 0.1–1.0)
Monocytes Relative: 8 % (ref 3–12)
Neutro Abs: 7 10*3/uL (ref 1.7–7.7)
Neutrophils Relative %: 60 % (ref 43–77)
Platelets: 454 10*3/uL — ABNORMAL HIGH (ref 150–400)
RBC: 2.17 MIL/uL — ABNORMAL LOW (ref 4.22–5.81)
RDW: 21.7 % — ABNORMAL HIGH (ref 11.5–15.5)
WBC: 11.6 10*3/uL — ABNORMAL HIGH (ref 4.0–10.5)

## 2014-08-05 LAB — COMPREHENSIVE METABOLIC PANEL
ALT: 30 U/L (ref 17–63)
AST: 58 U/L — ABNORMAL HIGH (ref 15–41)
Albumin: 3.9 g/dL (ref 3.5–5.0)
Alkaline Phosphatase: 68 U/L (ref 38–126)
Anion gap: 9 (ref 5–15)
BUN: 11 mg/dL (ref 6–20)
CO2: 27 mmol/L (ref 22–32)
Calcium: 8.7 mg/dL — ABNORMAL LOW (ref 8.9–10.3)
Chloride: 104 mmol/L (ref 101–111)
Creatinine, Ser: 0.34 mg/dL — ABNORMAL LOW (ref 0.61–1.24)
GFR calc Af Amer: >60 mL/min (ref >60)
GFR calc non Af Amer: >60 mL/min (ref >60)
Glucose, Bld: 93 mg/dL (ref 65–99)
Potassium: 5.4 mmol/L — ABNORMAL HIGH (ref 3.5–5.1)
Sodium: 140 mmol/L (ref 135–145)
Total Bilirubin: 2.9 mg/dL — ABNORMAL HIGH (ref 0.3–1.2)
Total Protein: 7 g/dL (ref 6.5–8.1)

## 2014-08-05 LAB — TROPONIN I
Troponin I: <0.03 ng/mL (ref <0.031)
Troponin I: <0.03 ng/mL (ref <0.031)
Troponin I: <0.03 ng/mL (ref <0.031)

## 2014-08-05 MED ORDER — DIPHENHYDRAMINE HCL 25 MG PO CAPS: 25.0000 mg | ORAL_CAPSULE | ORAL | Status: DC | PRN

## 2014-08-05 MED ORDER — DEXTROSE 5 % IV SOLN
500.0000 mg | INTRAVENOUS | Status: DC
  Administered 2014-08-05 – 2014-08-10 (×6): 500 mg via INTRAVENOUS
  Filled 2014-08-05 (×6): qty 500

## 2014-08-05 MED ORDER — DIPHENHYDRAMINE HCL 50 MG/ML IJ SOLN
25.0000 mg | INTRAMUSCULAR | Status: DC | PRN
  Filled 2014-08-05: qty 0.5

## 2014-08-05 MED ORDER — CEFTRIAXONE SODIUM IN DEXTROSE 20 MG/ML IV SOLN
1.0000 g | INTRAVENOUS | Status: DC
  Administered 2014-08-05 – 2014-08-09 (×5): 1 g via INTRAVENOUS
  Filled 2014-08-05 (×6): qty 50

## 2014-08-05 MED ORDER — HYDROMORPHONE HCL 1 MG/ML IJ SOLN
1.0000 mg | Freq: Once | INTRAMUSCULAR | Status: AC
  Administered 2014-08-05: 1 mg via INTRAVENOUS
  Filled 2014-08-05: qty 1

## 2014-08-05 NOTE — Progress Notes (Addendum)
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 Hb SS with crisis: Pt has used 24.9 5 mg with 156/50: demands/deliveries. Percocet is scheduled but was held overnight because patient was sleeping. I will encourage ambulation in anticipation of possible discharge home tomorrow. Leave PCA in place for PRN use.  2. Chest wall pain: Pt had CXR done last night and this was read by radiologist as possible acute chest vs mild pneumonia. However in the clinical context, he has no sequela of acute chest (no hypoxemia) and he has no fevers, cough, difficulty breathing or SOB. He has only a mild elevation of WBC count. He was started on Rocephin and Azithromycin. He does not have acute chest syndrome and may have an early pneumonia. In light of his allergies and mild clinical state will change to Cefdinir for a total of 10 days. 3. Poor Dentition: Pt has broken teeth and has an appointment to be seen by Dr. Mellissa Kohut, DDS on 7/11. He has had an initial consultation and currently has no evidence of infection. 4. Constipation: Pt has constipation but is refusing any medications or enemas.  Code Status: Full Code Family Communication: N/A Disposition Plan: Not yet ready for discharge  [place].  Pager [phone]. If 7PM-7AM, please contact night-coverage.  08/05/2014, 11:34 AM  LOS: 4 days    Consultants:  None  Procedures:  None  Antibiotics:  Rocephin 6/21 >> 6/21  Azithromycin 6/21 >> 6/21  Cefdinir 6/21  >>  HPI/Subjective: Pt reports pain as 6/10 and localized to chest, back and legs.  Objective: Filed Vitals:   08/05/14 0159 08/05/14 0519 08/05/14 0650 08/05/14 0800  BP: 114/64 111/72    Pulse: 90 69    Temp: 98.6 F (37 C) 98.3 F (36.8 C)    TempSrc: Oral     Resp: 16 16 11 10   Height:      Weight:      SpO2: 96% 95% 99% 93%   Weight change:   Intake/Output Summary (Last 24 hours) at 08/05/14 1134 Last data filed at 08/05/14 6948  Gross per 24 hour  Intake   2764 ml  Output   1225 ml  Net   1539 ml    General: Alert, awake, oriented x3, in no acute distress.  HEENT: [place]/AT PEERL, EOMI, anicteric. Neck: Trachea midline,  no masses, no thyromegal,y no JVD, no carotid bruit OROPHARYNX:  Moist, No exudate/ erythema/lesions.  Heart: Regular rate and rhythm, without murmurs, rubs, gallops, PMI non-displaced, no heaves or thrills on palpation.  Lungs: Clear to auscultation, no wheezing or rhonchi noted. No increased vocal fremitus resonant to percussion  Abdomen: Soft, nontender, nondistended, positive bowel sounds, no masses no hepatosplenomegaly noted..  Neuro: No focal neurological deficits noted cranial nerves II through XII grossly intact.  Strength at baseline in bilateral upper and lower extremities. Musculoskeletal: No warm swelling or erythema around joints, no spinal tenderness noted. Psychiatric: Patient alert and oriented x3, good insight and cognition, good recent to remote recall.    Data Reviewed: Basic Metabolic Panel:  Recent Labs Lab 07/31/14  1045 08/01/14 0014 08/01/14 0515 08/04/14 0440 08/05/14 0110  NA 136 138 137 140 140  K 3.7 3.8 3.8 3.7 5.4*  CL 105 103 103 103 104  CO2 25  --  27 31 27   GLUCOSE 90 93 107* 108* 93  BUN 11 7 6 9 11   CREATININE 0.50* 0.60* 0.42* 0.56* 0.34*  CALCIUM 8.4*  --  8.8* 8.9 8.7*   Liver Function Tests:  Recent Labs Lab 08/01/14 0515 08/05/14 0110  AST 27 58*  ALT 12* 30  ALKPHOS 66 68  BILITOT 6.7*  2.9*  PROT 7.0 7.0  ALBUMIN 3.9 3.9   No results for input(s): LIPASE, AMYLASE in the last 168 hours. No results for input(s): AMMONIA in the last 168 hours. CBC:  Recent Labs Lab 07/31/14 1045 07/31/14 2358 08/01/14 0014 08/01/14 0515 08/03/14 0433 08/04/14 0440 08/05/14 0110  WBC 16.9* 13.8*  --  13.3* 13.5* 12.5* 11.6*  NEUTROABS 13.2* 9.5*  --   --  9.4* 8.6* 7.0  HGB 9.6* 9.2* 10.9* 8.9* 7.1* 6.7* 6.2*  HCT 27.5* 26.6* 32.0* 26.1* 20.6* 20.0* 18.6*  MCV 85.1 84.7  --  86.1 86.2 86.2 85.7  PLT 485* 464*  --  463* 427* 432* 454*   Cardiac Enzymes:  Recent Labs Lab 08/05/14 0110 08/05/14 0708  TROPONINI <0.03 <0.03   BNP (last 3 results) No results for input(s): BNP in the last 8760 hours.  ProBNP (last 3 results) No results for input(s): PROBNP in the last 8760 hours.  CBG: No results for input(s): GLUCAP in the last 168 hours.  No results found for this or any previous visit (from the past 240 hour(s)).   Studies: Dg Chest 2 View  08/01/2014   CLINICAL DATA:  Sickle cell crisis. Upper back and chest pain, acute onset. Initial encounter.  EXAM: CHEST  2 VIEW  COMPARISON:  Chest radiograph performed 05/06/2014  FINDINGS: The lungs are well-aerated and clear. There is no evidence of focal opacification, pleural effusion or pneumothorax.  The heart is normal in size; the mediastinal contour is within normal limits. No acute osseous abnormalities are seen. Clips are noted within the right upper quadrant, reflecting prior cholecystectomy.  IMPRESSION: No acute cardiopulmonary process seen.   Electronically Signed   By: Garald Balding M.D.   On: 08/01/2014 00:17   Dg Chest Port 1 View  08/05/2014   CLINICAL DATA:  Acute onset of generalized chest pain. Initial encounter.  EXAM: PORTABLE CHEST - 1 VIEW  COMPARISON:  Chest radiograph performed 07/31/2014  FINDINGS: The lungs are well-aerated. Vascular congestion is noted. Mild right-sided airspace opacities may reflect  acute chest syndrome or mild pneumonia. No pleural effusion or pneumothorax is seen.  The cardiomediastinal silhouette is within normal limits. No acute osseous abnormalities are seen.  IMPRESSION: Vascular congestion noted. Mild right-sided airspace opacities may reflect acute chest syndrome or mild pneumonia.   Electronically Signed   By: Garald Balding M.D.   On: 08/05/2014 02:36    Scheduled Meds: . aspirin  81 mg Oral Daily  . azithromycin  500 mg Intravenous Q24H  . cefTRIAXone (ROCEPHIN)  IV  1 g Intravenous Q24H  . cholecalciferol  2,000 Units Oral Daily  . escitalopram  10 mg Oral Daily  . famotidine  20 mg Oral BID  . feeding supplement (ENSURE ENLIVE)  237 mL Oral TID BM  . folic acid  1 mg Oral Daily  . heparin  5,000 Units Subcutaneous 3 times per day  .  HYDROmorphone PCA 0.3 mg/mL   Intravenous 6 times per day  . hydroxyurea  500 mg Oral BID  . ibuprofen  600 mg Oral 3 times per day  . naloxegol oxalate  25 mg Oral Daily  . oxyCODONE-acetaminophen  1 tablet Oral Q4H   And  . oxyCODONE  5 mg Oral Q4H  . senna-docusate  1 tablet Oral BID   Continuous Infusions: . dextrose 5 % and 0.45% NaCl 75 mL/hr at 08/05/14 0649    Time spent 35 minutes

## 2014-08-05 NOTE — Progress Notes (Signed)
ANTIBIOTIC CONSULT NOTE - INITIAL  Pharmacy Consult for Rocephin Indication: CAP  Allergies  Allergen Reactions  . Levaquin [Levofloxacin] Itching  . Morphine And Related Hives  . Toradol [Ketorolac Tromethamine] Itching  . Tramadol Hives  . Vancomycin Itching  . Zosyn [Piperacillin Sod-Tazobactam So] Itching and Rash    Has taken rocephin in past    Patient Measurements: Height: 6\' 2"  (188 cm) Weight: 133 lb 11.2 oz (60.646 kg) IBW/kg (Calculated) : 82.2   Vital Signs: Temp: 98.3 F (36.8 C) (06/21 0519) Temp Source: Oral (06/21 0159) BP: 111/72 mmHg (06/21 0519) Pulse Rate: 69 (06/21 0519) Intake/Output from previous day: 06/20 0701 - 06/21 0700 In: 696.3 [I.V.:696.3] Out: 1225 [Urine:1225] Intake/Output from this shift: Total I/O In: -  Out: 1225 [Urine:1225]  Labs:  Recent Labs  08/03/14 0433 08/04/14 0440 08/05/14 0110  WBC 13.5* 12.5* 11.6*  HGB 7.1* 6.7* 6.2*  PLT 427* 432* 454*  CREATININE  --  0.56* 0.34*   Estimated Creatinine Clearance: 121 mL/min (by C-G formula based on Cr of 0.34). No results for input(s): VANCOTROUGH, VANCOPEAK, VANCORANDOM, GENTTROUGH, GENTPEAK, GENTRANDOM, TOBRATROUGH, TOBRAPEAK, TOBRARND, AMIKACINPEAK, AMIKACINTROU, AMIKACIN in the last 72 hours.   Microbiology: No results found for this or any previous visit (from the past 720 hour(s)).  Medical History: Past Medical History  Diagnosis Date  . Sickle cell anemia   . 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 Vitamin D deficiency y-6  . Depression     Medications:  Prescriptions prior to admission  Medication Sig Dispense Refill Last Dose  . folic acid (FOLVITE) 1 MG tablet Take 1 mg by mouth daily.   07/31/2014 at Unknown time  . hydroxyurea (HYDREA) 500 MG capsule Take 500 mg by mouth 2 (two) times daily. May take with food to minimize GI side effects.   07/31/2014 at 0800  . oxyCODONE-acetaminophen (PERCOCET)  10-325 MG per tablet Take 1 tablet by mouth every 4 (four) hours as needed for pain. 10 tablet 0 Past Week at Unknown time  . aspirin 81 MG chewable tablet Chew 1 tablet (81 mg total) by mouth daily. (Patient not taking: Reported on 05/05/2014) 30 tablet 3 Not Taking at Unknown time  . cholecalciferol (VITAMIN D) 1000 UNITS tablet Take 2 tablets (2,000 Units total) by mouth daily. (Patient not taking: Reported on 05/05/2014) 30 tablet 6 Not Taking at Unknown time  . diclofenac sodium (VOLTAREN) 1 % GEL Apply 2 g topically 4 (four) times daily. (Patient not taking: Reported on 05/05/2014) 1 Tube 0 Not Taking at Unknown time  . escitalopram (LEXAPRO) 10 MG tablet Take 1 tablet (10 mg total) by mouth daily. (Patient not taking: Reported on 05/05/2014) 30 tablet 1 Not Taking at Unknown time  . feeding supplement, ENSURE COMPLETE, (ENSURE COMPLETE) LIQD Take 237 mLs by mouth 3 (three) times daily between meals. (Patient not taking: Reported on 05/05/2014) 90 Bottle 3 Not Taking at Unknown time  . hydrOXYzine (ATARAX/VISTARIL) 50 MG tablet Take 1 tablet (50 mg total) by mouth at bedtime. (Patient not taking: Reported on 05/05/2014) 30 tablet 0 Not Taking at Unknown time  . polyethylene glycol (MIRALAX / GLYCOLAX) packet Take 17 g by mouth daily as needed for mild constipation. (Patient not taking: Reported on 05/05/2014) 14 each 0 Not Taking at Unknown time  . senna-docusate (SENOKOT-S) 8.6-50 MG per tablet Take 1 tablet by mouth 2 (two) times daily. (Patient not taking: Reported on 05/05/2014)  Not Taking at Unknown time   Scheduled:  . aspirin  81 mg Oral Daily  . azithromycin  500 mg Intravenous Q24H  . cefTRIAXone (ROCEPHIN)  IV  1 g Intravenous Q24H  . cholecalciferol  2,000 Units Oral Daily  . escitalopram  10 mg Oral Daily  . famotidine  20 mg Oral BID  . feeding supplement (ENSURE ENLIVE)  237 mL Oral TID BM  . folic acid  1 mg Oral Daily  . heparin  5,000 Units Subcutaneous 3 times per day  .  HYDROmorphone PCA 0.3 mg/mL   Intravenous 6 times per day  . hydroxyurea  500 mg Oral BID  . ibuprofen  600 mg Oral 3 times per day  . naloxegol oxalate  25 mg Oral Daily  . oxyCODONE-acetaminophen  1 tablet Oral Q4H   And  . oxyCODONE  5 mg Oral Q4H  . senna-docusate  1 tablet Oral BID   Infusions:  . dextrose 5 % and 0.45% NaCl 75 mL/hr at 08/04/14 1547   Assessment: 23 yoM with hx sickle cell disease now with CXR + possible mild PNA. Rocephin per Rx and Zmax per MD for CAP.   Goal of Therapy:  Treat PNA  Plan:   Rocephin 1Gm IV q24h  F/u cultures as needed  Dorrene German 08/05/2014,6:12 AM

## 2014-08-05 NOTE — Progress Notes (Signed)
Ambulating 02 sats 98-100% on Rm air. Pt c/o severe RLE pain with ambulation. Was able to ambulate aprox. 100-12ft

## 2014-08-06 DIAGNOSIS — N4889 Other specified disorders of penis: Secondary | ICD-10-CM

## 2014-08-06 DIAGNOSIS — R42 Dizziness and giddiness: Secondary | ICD-10-CM

## 2014-08-06 LAB — CBC WITH DIFFERENTIAL/PLATELET
Basophils Absolute: 0 10*3/uL (ref 0.0–0.1)
Basophils Relative: 0 % (ref 0–1)
Eosinophils Absolute: 0.5 10*3/uL (ref 0.0–0.7)
Eosinophils Relative: 3 % (ref 0–5)
HCT: 16.9 % — ABNORMAL LOW (ref 39.0–52.0)
Hemoglobin: 5.8 g/dL — CL (ref 13.0–17.0)
Lymphocytes Relative: 20 % (ref 12–46)
Lymphs Abs: 3.3 10*3/uL (ref 0.7–4.0)
MCH: 29.9 pg (ref 26.0–34.0)
MCHC: 34.3 g/dL (ref 30.0–36.0)
MCV: 87.1 fL (ref 78.0–100.0)
Monocytes Absolute: 2.2 10*3/uL — ABNORMAL HIGH (ref 0.1–1.0)
Monocytes Relative: 13 % — ABNORMAL HIGH (ref 3–12)
Neutro Abs: 10.6 10*3/uL — ABNORMAL HIGH (ref 1.7–7.7)
Neutrophils Relative %: 64 % (ref 43–77)
Platelets: 441 10*3/uL — ABNORMAL HIGH (ref 150–400)
RBC: 1.94 MIL/uL — ABNORMAL LOW (ref 4.22–5.81)
RDW: 24.9 % — ABNORMAL HIGH (ref 11.5–15.5)
WBC: 16.6 10*3/uL — ABNORMAL HIGH (ref 4.0–10.5)
nRBC: 22 /100 WBC — ABNORMAL HIGH

## 2014-08-06 LAB — BASIC METABOLIC PANEL
Anion gap: 7 (ref 5–15)
BUN: 18 mg/dL (ref 6–20)
CO2: 32 mmol/L (ref 22–32)
Calcium: 9 mg/dL (ref 8.9–10.3)
Chloride: 100 mmol/L — ABNORMAL LOW (ref 101–111)
Creatinine, Ser: 0.65 mg/dL (ref 0.61–1.24)
GFR calc Af Amer: >60 mL/min (ref >60)
GFR calc non Af Amer: >60 mL/min (ref >60)
Glucose, Bld: 102 mg/dL — ABNORMAL HIGH (ref 65–99)
Potassium: 4.3 mmol/L (ref 3.5–5.1)
Sodium: 139 mmol/L (ref 135–145)

## 2014-08-06 LAB — PREPARE RBC (CROSSMATCH)

## 2014-08-06 MED ORDER — SODIUM CHLORIDE 0.9 % IV SOLN: Freq: Once | INTRAVENOUS | Status: DC

## 2014-08-06 NOTE — Progress Notes (Signed)
Herpes culture obtained and sent to lab.

## 2014-08-06 NOTE — Progress Notes (Signed)
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 Hb SS with crisis:  Percocet is scheduled but was held overnight because patient was sleeping. I will encourage ambulation in anticipation of possible discharge home tomorrow. Leave PCA in place for PRN use.  2. Penile lesion: Pt is unable to give a good history regarding lesion. However it could be either unroofed her[atic lesion or chancroid lesion which is painful. I will send off RPR and also swab for HSV by PCR and culture. If RPR negative will treat for herpes.  3. Chest wall pain: Pt had CXR done last night and this was read by radiologist as possible acute chest vs mild pneumonia. However in the clinical context, he has no sequela of acute chest (no hypoxemia) and he has no fevers, cough, difficulty breathing or SOB. He has only a mild elevation of WBC count. He was started on Rocephin and Azithromycin. He does not have acute chest syndrome and may have an early pneumonia. In light of his allergies and mild clinical state will change to Cefdinir (for a total of 10 days of therapy)  at time of discharge as oral Cefdinir in pill form not formulary in hospital.  4. Poor Dentition: Pt has broken teeth and has an appointment to be seen by Dr. Mellissa Kohut, DDS on 7/11. He has had an initial consultation and currently has no evidence of infection. 5. Constipation: Pt has constipation but is refusing any medications or enemas. Will try Movantik. 6. Dizziness: Requested orthostatic blood  pressures.  Code Status: Full Code Family Communication: N/A Disposition Plan: Not yet ready for discharge  [place].  Pager [phone]. If 7PM-7AM, please contact night-coverage.  08/06/2014, 12:09 PM  LOS: 5 days    Consultants:  None  Procedures:  None  Antibiotics:  Rocephin 6/21 >>   Azithromycin 6/21 >>   HPI/Subjective: Pt reports pain as 5-6/10 and localized to chest, back and legs.  Objective: Filed Vitals:   08/06/14 0344 08/06/14 0517 08/06/14 0800 08/06/14 1004  BP:  119/59  112/46  Pulse:  93  78  Temp:  98.6 F (37 C)  98.6 F (37 C)  TempSrc:  Oral  Oral  Resp: 10 11 15 11   Height:      Weight:      SpO2: 91% 91% 91% 99%   Weight change:   Intake/Output Summary (Last 24 hours) at 08/06/14 1209 Last data filed at 08/06/14 1004  Gross per 24 hour  Intake   1441 ml  Output   2675 ml  Net  -1234 ml    General: Alert, awake, oriented x3, in no acute distress.  HEENT: [place]/AT PEERL, EOMI, anicteric. Neck: Trachea midline,  no masses, no thyromegal,y no JVD, no carotid bruit OROPHARYNX:  Moist, No exudate/ erythema/lesions.  Heart: Regular rate and rhythm, without murmurs, rubs, gallops, PMI non-displaced, no heaves or thrills on palpation.  Lungs: Clear to auscultation, no wheezing or rhonchi noted. No increased vocal fremitus resonant to percussion  Abdomen: Soft, nontender, nondistended, positive bowel sounds, no masses no hepatosplenomegaly noted.Marland Kitchen  Neuro: No focal neurological deficits noted cranial nerves II through XII grossly intact.  Strength at baseline in bilateral upper and lower extremities. Musculoskeletal: No warm swelling or erythema around joints, no spinal tenderness noted. Psychiatric: Patient alert and oriented x3, good insight and cognition, good recent to remote recall.    Data Reviewed: Basic Metabolic Panel:  Recent Labs Lab 07/31/14 1045 08/01/14 0014 08/01/14 0515 08/04/14 0440 08/05/14 0110  NA 136  138 137 140 140  K 3.7 3.8 3.8 3.7 5.4*  CL 105 103 103 103 104  CO2 25  --  27 31 27   GLUCOSE 90 93 107* 108* 93  BUN 11 7 6 9 11   CREATININE 0.50* 0.60* 0.42* 0.56* 0.34*  CALCIUM 8.4*  --  8.8* 8.9 8.7*   Liver Function Tests:  Recent Labs Lab 08/01/14 0515 08/05/14 0110  AST 27 58*  ALT 12* 30  ALKPHOS 66 68  BILITOT 6.7* 2.9*  PROT 7.0 7.0  ALBUMIN 3.9 3.9   No results for input(s): LIPASE, AMYLASE in the last 168 hours. No results for input(s): AMMONIA in the last 168 hours. CBC:  Recent Labs Lab 07/31/14 1045 07/31/14 2358 08/01/14 0014 08/01/14 0515 08/03/14 0433 08/04/14 0440 08/05/14 0110  WBC 16.9* 13.8*  --  13.3* 13.5* 12.5* 11.6*  NEUTROABS 13.2* 9.5*  --   --  9.4* 8.6* 7.0  HGB 9.6* 9.2* 10.9* 8.9* 7.1* 6.7* 6.2*  HCT 27.5* 26.6* 32.0* 26.1* 20.6* 20.0* 18.6*  MCV 85.1 84.7  --  86.1 86.2 86.2 85.7  PLT 485* 464*  --  463* 427* 432* 454*   Cardiac Enzymes:  Recent Labs Lab 08/05/14 0110 08/05/14 0708 08/05/14 1317  TROPONINI <0.03 <0.03 <0.03   BNP (last 3 results) No results for input(s): BNP in the last 8760 hours.  ProBNP (last 3 results) No results for input(s): PROBNP in the last 8760 hours.  CBG: No results for input(s): GLUCAP in the last 168 hours.  No results found for this or any previous visit (from the past 240 hour(s)).   Studies: Dg Chest 2 View  08/01/2014   CLINICAL DATA:  Sickle cell crisis. Upper back and chest pain, acute onset. Initial encounter.  EXAM: CHEST  2 VIEW  COMPARISON:  Chest radiograph performed 05/06/2014  FINDINGS: The lungs are well-aerated and clear. There is no evidence of focal opacification, pleural effusion or pneumothorax.  The heart is normal in size; the mediastinal contour is within normal limits. No acute osseous abnormalities are seen. Clips are noted within the right upper quadrant, reflecting prior cholecystectomy.  IMPRESSION: No acute cardiopulmonary process seen.   Electronically Signed    By: Garald Balding M.D.   On: 08/01/2014 00:17   Dg Chest Port 1 View  08/05/2014   CLINICAL DATA:  Acute onset of generalized chest pain. Initial encounter.  EXAM: PORTABLE CHEST - 1 VIEW  COMPARISON:  Chest radiograph performed 07/31/2014  FINDINGS: The lungs are well-aerated. Vascular congestion is noted. Mild right-sided airspace opacities may reflect acute chest syndrome or mild pneumonia. No pleural effusion or pneumothorax is seen.  The cardiomediastinal silhouette is within normal limits. No acute osseous abnormalities are seen.  IMPRESSION: Vascular congestion noted. Mild right-sided airspace opacities may reflect acute chest syndrome or mild pneumonia.   Electronically Signed   By: Garald Balding M.D.   On: 08/05/2014 02:36    Scheduled Meds: . aspirin  81 mg Oral Daily  . azithromycin  500 mg Intravenous Q24H  .  cefTRIAXone (ROCEPHIN)  IV  1 g Intravenous Q24H  . cholecalciferol  2,000 Units Oral Daily  . escitalopram  10 mg Oral Daily  . famotidine  20 mg Oral BID  . feeding supplement (ENSURE ENLIVE)  237 mL Oral TID BM  . folic acid  1 mg Oral Daily  . heparin  5,000 Units Subcutaneous 3 times per day  . HYDROmorphone PCA 0.3 mg/mL   Intravenous 6 times per day  . hydroxyurea  500 mg Oral BID  . ibuprofen  600 mg Oral 3 times per day  . naloxegol oxalate  25 mg Oral Daily  . oxyCODONE-acetaminophen  1 tablet Oral Q4H   And  . oxyCODONE  5 mg Oral Q4H  . senna-docusate  1 tablet Oral BID   Continuous Infusions: . dextrose 5 % and 0.45% NaCl 75 mL/hr at 08/05/14 2130    Time spent 40 minutes

## 2014-08-07 LAB — CBC WITH DIFFERENTIAL/PLATELET
Basophils Absolute: 0 10*3/uL (ref 0.0–0.1)
Basophils Relative: 0 % (ref 0–1)
Eosinophils Absolute: 0.4 10*3/uL (ref 0.0–0.7)
Eosinophils Relative: 3 % (ref 0–5)
HCT: 20.7 % — ABNORMAL LOW (ref 39.0–52.0)
Hemoglobin: 7 g/dL — ABNORMAL LOW (ref 13.0–17.0)
Lymphocytes Relative: 23 % (ref 12–46)
Lymphs Abs: 2.8 10*3/uL (ref 0.7–4.0)
MCH: 29.8 pg (ref 26.0–34.0)
MCHC: 33.8 g/dL (ref 30.0–36.0)
MCV: 88.1 fL (ref 78.0–100.0)
Monocytes Absolute: 1.1 10*3/uL — ABNORMAL HIGH (ref 0.1–1.0)
Monocytes Relative: 9 % (ref 3–12)
Neutro Abs: 7.9 10*3/uL — ABNORMAL HIGH (ref 1.7–7.7)
Neutrophils Relative %: 65 % (ref 43–77)
Platelets: 524 10*3/uL — ABNORMAL HIGH (ref 150–400)
RBC: 2.35 MIL/uL — ABNORMAL LOW (ref 4.22–5.81)
RDW: 23.6 % — ABNORMAL HIGH (ref 11.5–15.5)
WBC: 12.2 10*3/uL — ABNORMAL HIGH (ref 4.0–10.5)
nRBC: 45 /100 WBC — ABNORMAL HIGH

## 2014-08-07 LAB — TYPE AND SCREEN
ABO/RH(D): A POS
Antibody Screen: NEGATIVE
Donor AG Type: NEGATIVE
Unit division: 0

## 2014-08-07 NOTE — Progress Notes (Signed)
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 Hb SS with crisis:  Continue oral Percocet. Leave PCA in place for PRN use.  2. Penile lesion: Patient complained that the lesion is more painful. Await to PCR as well as RPR results. Once results are back we will treat accordingly. 3. Chest wall pain: Continue Cefdinir (for a total of 10 days of therapy)  at time of discharge as oral Cefdinir in pill form not formulary in hospital.  4. Poor Dentition: Pt has broken teeth and has an appointment to be seen by Dr. Mellissa Kohut, DDS on 7/11. He has had an initial consultation and currently has no evidence of infection. 5. Constipation: Pt has constipation but is refusing any medications or enemas. Will try Movantik. 6. Dizziness: Requested orthostatic blood pressures.  Code Status: Full Code Family Communication: N/A Disposition Plan: Not yet ready for discharge  [facility]  Pager [phone]. If 7PM-7AM, please contact night-coverage.  08/07/2014, 1:06 PM  LOS: 6 days    Consultants:  None  Procedures:  None  Antibiotics:  Rocephin 6/21 >>   Azithromycin 6/21 >>   HPI/Subjective: Pt reports pain as 7 out of 10 in his leg him back. He also  reports pain regarding his penile area and his lesion being a little bigger. Objective: Filed Vitals:   08/07/14 0447 08/07/14 0758 08/07/14 0947 08/07/14 1256  BP: 118/65  116/62   Pulse: 84  71   Temp: 98.4 F (36.9 C)  98.4 F (36.9 C)   TempSrc: Oral  Oral   Resp: 11 10 8 10    Height:      Weight: 60.056 kg (132 lb 6.4 oz)     SpO2: 99% 100% 100% 100%   Weight change:   Intake/Output Summary (Last 24 hours) at 08/07/14 1306 Last data filed at 08/07/14 1030  Gross per 24 hour  Intake 4842.5 ml  Output   3100 ml  Net 1742.5 ml    General: Alert, awake, oriented x3, in no acute distress.  HEENT: [place]/AT PEERL, EOMI, anicteric. Neck: Trachea midline,  no masses, no thyromegal,y no JVD, no carotid bruit OROPHARYNX:  Moist, No exudate/ erythema/lesions.  Heart: Regular rate and rhythm, without murmurs, rubs, gallops, PMI non-displaced, no heaves or thrills on palpation.  Lungs: Clear to auscultation, no wheezing or rhonchi noted. No increased vocal fremitus resonant to percussion  Abdomen: Soft, nontender, nondistended, positive bowel sounds, no masses no hepatosplenomegaly noted..  Neuro: No focal neurological deficits noted cranial nerves II through XII grossly intact.  Strength at baseline in bilateral upper and lower extremities. Musculoskeletal: No warm swelling or erythema around joints, no spinal tenderness noted. Psychiatric: Patient alert and oriented x3, good insight and cognition, good recent to remote recall.    Data Reviewed: Basic Metabolic Panel:  Recent Labs Lab 08/01/14 0014 08/01/14 0515 08/04/14 0440 08/05/14 0110 08/06/14 1130  NA 138 137 140 140 139  K 3.8 3.8 3.7 5.4* 4.3  CL 103 103 103 104 100*  CO2  --  27 31 27  32  GLUCOSE 93 107* 108*  93 102*  BUN 7 6 9 11 18   CREATININE 0.60* 0.42* 0.56* 0.34* 0.65  CALCIUM  --  8.8* 8.9 8.7* 9.0   Liver Function Tests:  Recent Labs Lab 08/01/14 0515 08/05/14 0110  AST 27 58*  ALT 12* 30  ALKPHOS 66 68  BILITOT 6.7* 2.9*  PROT 7.0 7.0  ALBUMIN 3.9 3.9   No results for input(s): LIPASE, AMYLASE in the last 168 hours. No results for input(s): AMMONIA in the last 168 hours. CBC:  Recent Labs Lab 08/03/14 0433 08/04/14 0440 08/05/14 0110 08/06/14 1130 08/07/14 0444   WBC 13.5* 12.5* 11.6* 16.6* 12.2*  NEUTROABS 9.4* 8.6* 7.0 10.6* 7.9*  HGB 7.1* 6.7* 6.2* 5.8* 7.0*  HCT 20.6* 20.0* 18.6* 16.9* 20.7*  MCV 86.2 86.2 85.7 87.1 88.1  PLT 427* 432* 454* 441* 524*   Cardiac Enzymes:  Recent Labs Lab 08/05/14 0110 08/05/14 0708 08/05/14 1317  TROPONINI <0.03 <0.03 <0.03   BNP (last 3 results) No results for input(s): BNP in the last 8760 hours.  ProBNP (last 3 results) No results for input(s): PROBNP in the last 8760 hours.  CBG: No results for input(s): GLUCAP in the last 168 hours.  Recent Results (from the past 240 hour(s))  Herpes simplex virus culture     Status: None (Preliminary result)   Collection Time: 08/06/14  9:39 PM  Result Value Ref Range Status   Specimen Description Penile  Final   Special Requests Normal  Final   Culture   Final    Culture has been initiated. Performed at [facility]    Report Status PENDING  Incomplete     Studies: Dg Chest 2 View  08/01/2014   CLINICAL DATA:  Sickle cell crisis. Upper back and chest pain, acute onset. Initial encounter.  EXAM: CHEST  2 VIEW  COMPARISON:  Chest radiograph performed 05/06/2014  FINDINGS: The lungs are well-aerated and clear. There is no evidence of focal opacification, pleural effusion or pneumothorax.  The heart is normal in size; the mediastinal contour is within normal limits. No acute osseous abnormalities are seen. Clips are noted within the right upper quadrant, reflecting prior cholecystectomy.  IMPRESSION: No acute cardiopulmonary process seen.   Electronically Signed   By: Garald Balding M.D.   On: 08/01/2014 00:17   Dg Chest Port 1 View  08/05/2014   CLINICAL DATA:  Acute onset of generalized chest pain. Initial encounter.  EXAM: PORTABLE CHEST - 1 VIEW  COMPARISON:  Chest radiograph performed 07/31/2014  FINDINGS: The lungs are well-aerated. Vascular congestion is noted. Mild right-sided airspace opacities may reflect acute chest syndrome or mild  pneumonia. No pleural effusion or pneumothorax is seen.  The cardiomediastinal silhouette is within normal limits. No acute osseous abnormalities are seen.  IMPRESSION: Vascular congestion noted. Mild right-sided airspace opacities may reflect acute chest syndrome or mild pneumonia.   Electronically Signed   By: Garald Balding M.D.   On: 08/05/2014 02:36    Scheduled Meds: . sodium chloride   Intravenous Once  . aspirin  81 mg Oral Daily  . azithromycin  500 mg Intravenous Q24H  . cefTRIAXone (ROCEPHIN)  IV  1 g Intravenous Q24H  . cholecalciferol  2,000 Units Oral Daily  . escitalopram  10 mg Oral Daily  . famotidine  20 mg Oral BID  . feeding supplement (ENSURE ENLIVE)  237 mL Oral TID BM  . folic acid  1 mg Oral Daily  . heparin  5,000 Units Subcutaneous 3 times  per day  . HYDROmorphone PCA 0.3 mg/mL   Intravenous 6 times per day  . hydroxyurea  500 mg Oral BID  . ibuprofen  600 mg Oral 3 times per day  . naloxegol oxalate  25 mg Oral Daily  . oxyCODONE-acetaminophen  1 tablet Oral Q4H   And  . oxyCODONE  5 mg Oral Q4H  . senna-docusate  1 tablet Oral BID   Continuous Infusions: . dextrose 5 % and 0.45% NaCl 75 mL/hr at 08/07/14 1030    Time spent 40 minutes

## 2014-08-08 LAB — HERPES SIMPLEX VIRUS CULTURE
Culture: NOT DETECTED
Special Requests: NORMAL

## 2014-08-08 LAB — HIV ANTIBODY (ROUTINE TESTING W REFLEX): HIV Screen 4th Generation wRfx: NONREACTIVE

## 2014-08-08 NOTE — Progress Notes (Signed)
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 Hb SS with crisis:  Patient still having significant pain. Continue oral Percocet. Leave PCA in place for PRN use.  2. Penile lesion: No change in the size of lesion. Await to PCR as well as RPR results. Once results are back we will treat accordingly. 3. Chest wall pain: Continue Cefdinir (for a total of 10 days of therapy). 4. Poor Dentition: Much better. Continue current treatment. 5. Constipation:  Will continue to try Movantik. 6. Dizziness: Resolved. Negative for orthostasis  Code Status: Full Code Family Communication: N/A Disposition Plan: Not yet ready for discharge  [facility]  Pager [phone]. If 7PM-7AM, please contact night-coverage.  08/08/2014, 7:41 AM  LOS: 7 days    Consultants:  None  Procedures:  None  Antibiotics:  Rocephin 6/21 >>   Azithromycin 6/21 >>   HPI/Subjective:  patient still having pain. He seems slightly confused today. But otherwise stable. Objective: Filed Vitals:   08/08/14 0026 08/08/14 0229 08/08/14 0430 08/08/14 0603  BP:  111/69  117/68  Pulse:  78  80  Temp:  98.7 F (37.1 C)  98 F (36.7 C)  TempSrc:  Oral  Oral  Resp: 11 12 12 14   Height:      Weight:    53.524 kg (118 lb)  SpO2: 100% 100% 100% 100%   Weight change: -6.532 kg (-14 lb 6.4 oz)  Intake/Output Summary (Last 24 hours) at 08/08/14 0741 Last data filed at 08/08/14 0653  Gross per 24 hour  Intake   3718 ml  Output   3825 ml  Net   -107 ml     General: Alert, awake, oriented x3, in no acute distress.  HEENT: [place]/AT PEERL, EOMI, anicteric. Neck: Trachea midline,  no masses, no thyromegal,y no JVD, no carotid bruit OROPHARYNX:  Moist, No exudate/ erythema/lesions.  Heart: Regular rate and rhythm, without murmurs, rubs, gallops, PMI non-displaced, no heaves or thrills on palpation.  Lungs: Clear to auscultation, no wheezing or rhonchi noted. No increased vocal fremitus resonant to percussion  Abdomen: Soft, nontender, nondistended, positive bowel sounds, no masses no hepatosplenomegaly noted..  Neuro: No focal neurological deficits noted cranial nerves II through XII grossly intact.  Strength at baseline in bilateral upper and lower extremities. Musculoskeletal: No warm swelling or erythema around joints, no spinal tenderness noted. Psychiatric: Patient alert and oriented x3, good insight and cognition, good recent to remote recall.    Data Reviewed: Basic Metabolic Panel:  Recent Labs Lab 08/04/14 0440 08/05/14 0110 08/06/14 1130  NA 140 140 139  K 3.7 5.4* 4.3  CL 103 104 100*  CO2 31 27 32  GLUCOSE 108* 93 102*  BUN 9 11 18   CREATININE 0.56* 0.34* 0.65  CALCIUM 8.9 8.7* 9.0   Liver Function Tests:  Recent Labs Lab 08/05/14 0110  AST 58*  ALT 30  ALKPHOS 68  BILITOT 2.9*  PROT 7.0  ALBUMIN 3.9   No results for input(s): LIPASE, AMYLASE in the last 168 hours. No results for input(s): AMMONIA in the last 168 hours. CBC:  Recent Labs Lab 08/03/14 0433 08/04/14 0440 08/05/14 0110 08/06/14 1130 08/07/14 0444  WBC 13.5* 12.5* 11.6* 16.6* 12.2*  NEUTROABS 9.4* 8.6* 7.0 10.6* 7.9*  HGB 7.1* 6.7* 6.2* 5.8* 7.0*  HCT 20.6* 20.0* 18.6* 16.9* 20.7*  MCV 86.2 86.2 85.7 87.1 88.1  PLT 427* 432* 454* 441* 524*   Cardiac Enzymes:  Recent Labs Lab 08/05/14 0110 08/05/14 0708 08/05/14 1317  TROPONINI <0.03 <0.03 <0.03   BNP (last 3 results) No results for input(s): BNP in the last 8760  hours.  ProBNP (last 3 results) No results for input(s): PROBNP in the last 8760 hours.  CBG: No results for input(s): GLUCAP in the last 168 hours.  Recent Results (from the past 240 hour(s))  Herpes simplex virus culture     Status: None (Preliminary result)   Collection Time: 08/06/14  9:39 PM  Result Value Ref Range Status   Specimen Description Penile  Final   Special Requests Normal  Final   Culture   Final    Culture has been initiated. Performed at [facility]    Report Status PENDING  Incomplete     Studies: Dg Chest 2 View  08/01/2014   CLINICAL DATA:  Sickle cell crisis. Upper back and chest pain, acute onset. Initial encounter.  EXAM: CHEST  2 VIEW  COMPARISON:  Chest radiograph performed 05/06/2014  FINDINGS: The lungs are well-aerated and clear. There is no evidence of focal opacification, pleural effusion or pneumothorax.  The heart is normal in size; the mediastinal contour is within normal limits. No acute osseous abnormalities are seen. Clips are noted within the right upper quadrant, reflecting prior cholecystectomy.  IMPRESSION: No acute cardiopulmonary process seen.   Electronically Signed   By: Garald Balding M.D.   On: 08/01/2014 00:17   Dg Chest Port 1 View  08/05/2014   CLINICAL DATA:  Acute onset of generalized chest pain. Initial encounter.  EXAM: PORTABLE CHEST - 1 VIEW  COMPARISON:  Chest radiograph performed 07/31/2014  FINDINGS: The lungs are well-aerated. Vascular congestion is noted. Mild right-sided airspace opacities may reflect acute chest syndrome or mild pneumonia. No pleural effusion or pneumothorax is seen.  The cardiomediastinal silhouette is within normal limits. No acute osseous abnormalities are seen.  IMPRESSION: Vascular congestion noted. Mild right-sided airspace opacities may reflect acute chest syndrome or mild pneumonia.   Electronically Signed   By: Garald Balding M.D.   On: 08/05/2014 02:36    Scheduled Meds: . sodium chloride    Intravenous Once  . aspirin  81 mg Oral Daily  . azithromycin  500 mg Intravenous Q24H  . cefTRIAXone (ROCEPHIN)  IV  1 g Intravenous Q24H  . cholecalciferol  2,000 Units Oral Daily  . escitalopram  10 mg Oral Daily  . famotidine  20 mg Oral BID  . feeding supplement (ENSURE ENLIVE)  237 mL Oral TID BM  . folic acid  1 mg Oral Daily  . heparin  5,000 Units Subcutaneous 3 times per day  . HYDROmorphone PCA 0.3 mg/mL   Intravenous 6 times per day  . hydroxyurea  500 mg Oral BID  . ibuprofen  600 mg Oral 3 times per day  . naloxegol oxalate  25 mg Oral Daily  . oxyCODONE-acetaminophen  1 tablet Oral Q4H   And  . oxyCODONE  5 mg Oral Q4H  . senna-docusate  1 tablet Oral BID   Continuous Infusions: . dextrose 5 % and 0.45% NaCl 75 mL/hr at 08/07/14 2108  Time spent 35 minutes

## 2014-08-09 LAB — RPR: RPR Ser Ql: NONREACTIVE

## 2014-08-09 MED ORDER — VALACYCLOVIR HCL 500 MG PO TABS
500.0000 mg | ORAL_TABLET | Freq: Two times a day (BID) | ORAL | Status: DC
  Administered 2014-08-09 – 2014-08-11 (×5): 500 mg via ORAL
  Filled 2014-08-09 (×6): qty 1

## 2014-08-09 NOTE — Progress Notes (Addendum)
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 Hb SS with crisis:  Patient is using a little bit more on Dilaudid today. Continue oral Percocet. Leave PCA in place for PRN use for another 24 hours.  2. Penile lesion: We will start Valtrex as patient has oral lesions consistent with new herpes lesions 3. Chest wall pain: We'll place on doxycycline for outpatient use due to penicillin allergy  4. Poor Dentition: Very much improved. Continue follow-up as an outpatient. 5. Constipation: Continue Movantik. 6. Dizziness: Resolved. Mobilize patient.  Code Status: Full Code Family Communication: N/A Disposition Plan: Not yet ready for discharge  [facility]  Pager [phone]. If 7PM-7AM, please contact night-coverage.  08/09/2014, 2:05 PM  LOS: 8 days    Consultants:  None  Procedures:  None  Antibiotics:  Rocephin 6/21 >>   Azithromycin 6/21 >>   HPI/Subjective:  patient is very drowsy and sleepy today. He is complaining of significant pain at 7 out of 10. He still on his Percocet as well as PCA. He has used 32 mg of the Dilaudid with 80 demands and 65 deliveries. His penile lesion also seems to be a little bigger. Objective: Filed Vitals:   08/09/14 0536 08/09/14 0754 08/09/14 1005 08/09/14 1229  BP: 106/58  125/65   Pulse: 75  69   Temp: 98 F (36.7 C)  98.3 F (36.8 C)   TempSrc: Oral  Oral   Resp: 8 10 16 10   Height:      Weight:      SpO2: 100% 100% 100% 100%   Weight change:   Intake/Output Summary  (Last 24 hours) at 08/09/14 1405 Last data filed at 08/09/14 1020  Gross per 24 hour  Intake 3496.75 ml  Output   3465 ml  Net  31.75 ml    General: Alert, awake, oriented x3, in no acute distress.  HEENT: [place]/AT PEERL, EOMI, anicteric. Neck: Trachea midline,  no masses, no thyromegal,y no JVD, no carotid bruit OROPHARYNX:  Moist, No exudate/ erythema/lesions.  Heart: Regular rate and rhythm, without murmurs, rubs, gallops, PMI non-displaced, no heaves or thrills on palpation.  Lungs: Clear to auscultation, no wheezing or rhonchi noted. No increased vocal fremitus resonant to percussion  Abdomen: Soft, nontender, nondistended, positive bowel sounds, no masses no hepatosplenomegaly noted..  Neuro: No focal neurological deficits noted cranial nerves II through XII grossly intact.  Strength at baseline in bilateral upper and lower extremities. Musculoskeletal: No warm swelling or erythema around joints, no spinal tenderness noted. Psychiatric: Patient alert and oriented x3, good insight and cognition, good recent to remote recall.    Data Reviewed: Basic Metabolic Panel:  Recent Labs Lab 08/04/14 0440 08/05/14 0110 08/06/14 1130  NA 140 140 139  K 3.7 5.4* 4.3  CL 103 104 100*  CO2 31 27 32  GLUCOSE 108* 93 102*  BUN 9 11 18   CREATININE 0.56* 0.34* 0.65  CALCIUM 8.9 8.7* 9.0   Liver Function Tests:  Recent Labs Lab 08/05/14 0110  AST 58*  ALT 30  ALKPHOS 68  BILITOT 2.9*  PROT  7.0  ALBUMIN 3.9   No results for input(s): LIPASE, AMYLASE in the last 168 hours. No results for input(s): AMMONIA in the last 168 hours. CBC:  Recent Labs Lab 08/03/14 0433 08/04/14 0440 08/05/14 0110 08/06/14 1130 08/07/14 0444  WBC 13.5* 12.5* 11.6* 16.6* 12.2*  NEUTROABS 9.4* 8.6* 7.0 10.6* 7.9*  HGB 7.1* 6.7* 6.2* 5.8* 7.0*  HCT 20.6* 20.0* 18.6* 16.9* 20.7*  MCV 86.2 86.2 85.7 87.1 88.1  PLT 427* 432* 454* 441* 524*   Cardiac Enzymes:  Recent Labs Lab 08/05/14 0110  08/05/14 0708 08/05/14 1317  TROPONINI <0.03 <0.03 <0.03   BNP (last 3 results) No results for input(s): BNP in the last 8760 hours.  ProBNP (last 3 results) No results for input(s): PROBNP in the last 8760 hours.  CBG: No results for input(s): GLUCAP in the last 168 hours.  Recent Results (from the past 240 hour(s))  Herpes simplex virus culture     Status: None   Collection Time: 08/06/14  9:39 PM  Result Value Ref Range Status   Specimen Description Penile  Final   Special Requests Normal  Final   Culture   Final    No Herpes Simplex Virus detected. Performed at [facility]    Report Status 08/08/2014 FINAL  Final     Studies: Dg Chest 2 View  08/01/2014   CLINICAL DATA:  Sickle cell crisis. Upper back and chest pain, acute onset. Initial encounter.  EXAM: CHEST  2 VIEW  COMPARISON:  Chest radiograph performed 05/06/2014  FINDINGS: The lungs are well-aerated and clear. There is no evidence of focal opacification, pleural effusion or pneumothorax.  The heart is normal in size; the mediastinal contour is within normal limits. No acute osseous abnormalities are seen. Clips are noted within the right upper quadrant, reflecting prior cholecystectomy.  IMPRESSION: No acute cardiopulmonary process seen.   Electronically Signed   By: Garald Balding M.D.   On: 08/01/2014 00:17   Dg Chest Port 1 View  08/05/2014   CLINICAL DATA:  Acute onset of generalized chest pain. Initial encounter.  EXAM: PORTABLE CHEST - 1 VIEW  COMPARISON:  Chest radiograph performed 07/31/2014  FINDINGS: The lungs are well-aerated. Vascular congestion is noted. Mild right-sided airspace opacities may reflect acute chest syndrome or mild pneumonia. No pleural effusion or pneumothorax is seen.  The cardiomediastinal silhouette is within normal limits. No acute osseous abnormalities are seen.  IMPRESSION: Vascular congestion noted. Mild right-sided airspace opacities may reflect acute chest syndrome or mild  pneumonia.   Electronically Signed   By: Garald Balding M.D.   On: 08/05/2014 02:36    Scheduled Meds: . sodium chloride   Intravenous Once  . aspirin  81 mg Oral Daily  . azithromycin  500 mg Intravenous Q24H  . cefTRIAXone (ROCEPHIN)  IV  1 g Intravenous Q24H  . cholecalciferol  2,000 Units Oral Daily  . escitalopram  10 mg Oral Daily  . famotidine  20 mg Oral BID  . feeding supplement (ENSURE ENLIVE)  237 mL Oral TID BM  . folic acid  1 mg Oral Daily  . heparin  5,000 Units Subcutaneous 3 times per day  . HYDROmorphone PCA 0.3 mg/mL   Intravenous 6 times per day  . hydroxyurea  500 mg Oral BID  . ibuprofen  600 mg Oral 3 times per day  . naloxegol oxalate  25 mg Oral Daily  . oxyCODONE-acetaminophen  1 tablet Oral Q4H   And  . oxyCODONE  5 mg Oral Q4H  . senna-docusate  1 tablet Oral BID  . valACYclovir  500 mg Oral BID   Continuous Infusions: . dextrose 5 % and 0.45% NaCl 75 mL/hr at 08/09/14 0000    Time spent 40 minutes

## 2014-08-10 MED ORDER — DOXYCYCLINE HYCLATE 100 MG PO TABS
100.0000 mg | ORAL_TABLET | Freq: Two times a day (BID) | ORAL | Status: DC
  Administered 2014-08-10 – 2014-08-11 (×3): 100 mg via ORAL
  Filled 2014-08-10 (×4): qty 1

## 2014-08-10 MED ORDER — HYDROMORPHONE HCL 2 MG/ML IJ SOLN
2.0000 mg | INTRAMUSCULAR | Status: DC | PRN
  Administered 2014-08-10 – 2014-08-11 (×10): 2 mg via INTRAVENOUS
  Filled 2014-08-10 (×10): qty 1

## 2014-08-10 NOTE — Progress Notes (Signed)
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 Hb SS with crisis:  Dc Dilaudid PCa today and conintue orals with rescue doses. Plan Dc in am. 2. Penile lesion: Continue Valtrex as patient has oral lesions consistent with new herpes lesions 3. Chest wall pain: We'll place on doxycycline for outpatient use due to penicillin allergy  4. Poor Dentition: Very much improved. Continue follow-up as an outpatient. 5. Constipation: Continue Movantik. 6. Dizziness: Resolved. Mobilize patient.  Code Status: Full Code Family Communication: N/A Disposition Plan: Dc in am  [facility]  Pager [phone]. If 7PM-7AM, please contact night-coverage.  08/10/2014, 7:00 AM  LOS: 9 days    Consultants:  None  Procedures:  None  Antibiotics:  Rocephin 6/21 >>   Azithromycin 6/21 >>   HPI/Subjective: Patient doing better today. Still not mobile and has been on high dose Dilaudid PCA. He used 22 mg of the Dilaudid with 50 demands and 44 deliveries today. Still having pin in his legs him back. Objective: Filed Vitals:   08/09/14 2033 08/10/14 0151 08/10/14 0208 08/10/14 0522  BP: 126/58  110/57 112/57  Pulse: 73  90 88  Temp: 98.8 F (37.1 C)  98.9 F (37.2 C) 98 F (36.7 C)  TempSrc: Oral  Oral Oral  Resp: 12 14 10 11   Height:      Weight:    54.432 kg (120 lb)  SpO2: 97%  100% 100%   Weight change:   Intake/Output Summary (Last 24 hours) at 08/10/14 0700 Last data filed at 08/10/14 9675  Gross per 24 hour  Intake    840  ml  Output   3100 ml  Net  -2260 ml    General: Alert, awake, oriented x3, in no acute distress.  HEENT: [place]/AT PEERL, EOMI, anicteric. Neck: Trachea midline,  no masses, no thyromegal,y no JVD, no carotid bruit OROPHARYNX:  Moist, No exudate/ erythema/lesions.  Heart: Regular rate and rhythm, without murmurs, rubs, gallops, PMI non-displaced, no heaves or thrills on palpation.  Lungs: Clear to auscultation, no wheezing or rhonchi noted. No increased vocal fremitus resonant to percussion  Abdomen: Soft, nontender, nondistended, positive bowel sounds, no masses no hepatosplenomegaly noted..  Neuro: No focal neurological deficits noted cranial nerves II through XII grossly intact.  Strength at baseline in bilateral upper and lower extremities. Musculoskeletal: No warm swelling or erythema around joints, no spinal tenderness noted. Psychiatric: Patient alert and oriented x3, good insight and cognition, good recent to remote recall.    Data Reviewed: Basic Metabolic Panel:  Recent Labs Lab 08/04/14 0440 08/05/14 0110 08/06/14 1130  NA 140 140 139  K 3.7 5.4* 4.3  CL 103 104 100*  CO2 31 27 32  GLUCOSE 108* 93 102*  BUN 9 11 18   CREATININE 0.56* 0.34* 0.65  CALCIUM 8.9 8.7* 9.0   Liver Function Tests:  Recent Labs Lab 08/05/14 0110  AST 58*  ALT 30  ALKPHOS 68  BILITOT 2.9*  PROT 7.0  ALBUMIN 3.9   No results for input(s): LIPASE, AMYLASE in the last 168 hours. No results for input(s):  AMMONIA in the last 168 hours. CBC:  Recent Labs Lab 08/04/14 0440 08/05/14 0110 08/06/14 1130 08/07/14 0444  WBC 12.5* 11.6* 16.6* 12.2*  NEUTROABS 8.6* 7.0 10.6* 7.9*  HGB 6.7* 6.2* 5.8* 7.0*  HCT 20.0* 18.6* 16.9* 20.7*  MCV 86.2 85.7 87.1 88.1  PLT 432* 454* 441* 524*   Cardiac Enzymes:  Recent Labs Lab 08/05/14 0110 08/05/14 0708 08/05/14 1317  TROPONINI <0.03 <0.03 <0.03   BNP (last 3 results) No results for input(s): BNP in the last 8760 hours.  ProBNP (last  3 results) No results for input(s): PROBNP in the last 8760 hours.  CBG: No results for input(s): GLUCAP in the last 168 hours.  Recent Results (from the past 240 hour(s))  Herpes simplex virus culture     Status: None   Collection Time: 08/06/14  9:39 PM  Result Value Ref Range Status   Specimen Description Penile  Final   Special Requests Normal  Final   Culture   Final    No Herpes Simplex Virus detected. Performed at [facility]    Report Status 08/08/2014 FINAL  Final     Studies: Dg Chest 2 View  08/01/2014   CLINICAL DATA:  Sickle cell crisis. Upper back and chest pain, acute onset. Initial encounter.  EXAM: CHEST  2 VIEW  COMPARISON:  Chest radiograph performed 05/06/2014  FINDINGS: The lungs are well-aerated and clear. There is no evidence of focal opacification, pleural effusion or pneumothorax.  The heart is normal in size; the mediastinal contour is within normal limits. No acute osseous abnormalities are seen. Clips are noted within the right upper quadrant, reflecting prior cholecystectomy.  IMPRESSION: No acute cardiopulmonary process seen.   Electronically Signed   By: Garald Balding M.D.   On: 08/01/2014 00:17   Dg Chest Port 1 View  08/05/2014   CLINICAL DATA:  Acute onset of generalized chest pain. Initial encounter.  EXAM: PORTABLE CHEST - 1 VIEW  COMPARISON:  Chest radiograph performed 07/31/2014  FINDINGS: The lungs are well-aerated. Vascular congestion is noted. Mild right-sided airspace opacities may reflect acute chest syndrome or mild pneumonia. No pleural effusion or pneumothorax is seen.  The cardiomediastinal silhouette is within normal limits. No acute osseous abnormalities are seen.  IMPRESSION: Vascular congestion noted. Mild right-sided airspace opacities may reflect acute chest syndrome or mild pneumonia.   Electronically Signed   By: Garald Balding M.D.   On: 08/05/2014 02:36    Scheduled Meds: . sodium chloride   Intravenous Once  . aspirin   81 mg Oral Daily  . cholecalciferol  2,000 Units Oral Daily  . doxycycline  100 mg Oral Q12H  . escitalopram  10 mg Oral Daily  . famotidine  20 mg Oral BID  . feeding supplement (ENSURE ENLIVE)  237 mL Oral TID BM  . folic acid  1 mg Oral Daily  . heparin  5,000 Units Subcutaneous 3 times per day  . hydroxyurea  500 mg Oral BID  . ibuprofen  600 mg Oral 3 times per day  . naloxegol oxalate  25 mg Oral Daily  . oxyCODONE-acetaminophen  1 tablet Oral Q4H   And  . oxyCODONE  5 mg Oral Q4H  . senna-docusate  1 tablet Oral BID  . valACYclovir  500 mg Oral BID   Continuous Infusions: . dextrose 5 % and 0.45% NaCl 75 mL/hr at 08/09/14 0000    Time spent 40 minutes

## 2014-08-11 MED ORDER — VALACYCLOVIR HCL 500 MG PO TABS: 500.0000 mg | ORAL_TABLET | Freq: Two times a day (BID) | ORAL | Status: DC

## 2014-08-11 MED ORDER — DOXYCYCLINE HYCLATE 100 MG PO TABS: 100.0000 mg | ORAL_TABLET | Freq: Two times a day (BID) | ORAL | Status: DC

## 2014-08-11 NOTE — Discharge Summary (Signed)
Physician Discharge Summary  Patient ID: Timothy Marks MRN: [id number] DOB/AGE: 1990/03/14 24 y.o.  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 During hospitalization patient had some penile lesion that was found to be herpes simplex most likely. He was on IV Dilaudid PCA and Toradol. He was also treated with IV doxycycline Rocephin as well as Valciclovir for his STD. He was gradually transition to oral pain medications and discharged on oral doxycycline and Valtrex. He is to follow-up with his primary care physician. Patient has multiple other problems as listed above. Most of them are chronic but his psych issues also require outpatient referral for psychiatric management. We will defer this decision to his primary care physician.  Consults: None  Significant Diagnostic Studies: labs: multiple CBCs and CMP is where done. Viral cultures were done for HSV.  Treatments: IV hydration, antibiotics: azithromycin and doxycycline and analgesia: Dilaudid  Discharge Exam: Blood pressure 105/47, pulse 61, temperature 98.6 F (37 C), temperature source Oral, resp. rate 16, height 6\' 2"  (1.88 m), weight 55.021 kg (121 lb 4.8 oz), SpO2 100 %. General appearance: alert, cooperative, appears stated age and no distress Head: Normocephalic, without obvious abnormality, atraumatic Eyes: conjunctivae/corneas  clear. PERRL, EOM's intact. Fundi benign. Neck: no adenopathy, no carotid bruit, no JVD, supple, symmetrical, trachea midline and thyroid not enlarged, symmetric, no tenderness/mass/nodules Back: symmetric, no curvature. ROM normal. No CVA tenderness. Resp: clear to auscultation bilaterally Chest wall: no tenderness Cardio: regular rate and rhythm, S1, S2 normal, no murmur, click, rub or gallop GI: soft, non-tender; bowel sounds normal; no masses,  no organomegaly Extremities: extremities normal, atraumatic, no cyanosis or edema Pulses: 2+ and symmetric Skin: Skin color, texture, turgor normal. No rashes or lesions Neurologic: Grossly normal  Disposition: 01-Home or Self Care     Medication List    TAKE these medications        aspirin 81 MG chewable tablet  Chew 1 tablet (81 mg total) by mouth daily.     cholecalciferol 1000 UNITS tablet  Commonly known as:  VITAMIN D  Take 2 tablets (2,000 Units total) by mouth daily.     diclofenac sodium 1 % Gel  Commonly known as:  VOLTAREN  Apply 2 g topically 4 (four) times daily.     doxycycline 100 MG tablet  Commonly known as:  VIBRA-TABS  Take 1 tablet (100 mg total) by mouth every 12 (twelve) hours.     escitalopram 10 MG tablet  Commonly known as:  LEXAPRO  Take 1 tablet (10 mg total) by mouth daily.     feeding supplement (ENSURE COMPLETE) Liqd  Take 237 mLs by mouth 3 (three) times daily between meals.     folic acid 1 MG tablet  Commonly known as:  FOLVITE  Take 1 mg by mouth daily.     hydroxyurea 500 MG capsule  Commonly known as:  HYDREA  Take 500 mg by mouth 2 (two) times daily. May take with  food to minimize GI side effects.     hydrOXYzine 50 MG tablet  Commonly known as:  ATARAX/VISTARIL  Take 1 tablet (50 mg total) by mouth at bedtime.     oxyCODONE-acetaminophen 10-325 MG per tablet  Commonly known as:  PERCOCET  Take 1 tablet by mouth every 4 (four) hours as needed for pain.     polyethylene glycol  packet  Commonly known as:  MIRALAX / GLYCOLAX  Take 17 g by mouth daily as needed for mild constipation.     senna-docusate 8.6-50 MG per tablet  Commonly known as:  Senokot-S  Take 1 tablet by mouth 2 (two) times daily.     valACYclovir 500 MG tablet  Commonly known as:  VALTREX  Take 1 tablet (500 mg total) by mouth 2 (two) times daily.         SignedBarbette Merino 08/11/2014, 3:49 PM  Time spent 33 minutes

## 2014-08-11 NOTE — Progress Notes (Signed)
CSW received notification from RN that pt for discharge today and needs assistance with transportation home. Per RN, pt has multiple bags of belongings which would be difficult for pt to transport on bus.  CSW spoke to [place] department Surveyor, quantity, Nathaniel Man who approved cab voucher.  CSW met with pt at bedside. CSW confirmed address. CSW discussed with pt that it will be important during future visits to the hospital to ensure that he has transportation upon discharge or has funds for a cab because the only other resource that will be able to be offered on future visits will be a bus pass. Pt expressed understanding.  CSW provided cab voucher to RN to contact [facility] when pt ready to leave the hospital.   No further social work needs identified at this time.  CSW signing off.   Alison Murray, MSW, [place] [employer] [phone]

## 2014-11-07 DIAGNOSIS — J189 Pneumonia, unspecified organism: Secondary | ICD-10-CM | POA: Insufficient documentation

## 2014-11-08 DIAGNOSIS — D5701 Hb-SS disease with acute chest syndrome: Secondary | ICD-10-CM | POA: Insufficient documentation

## 2014-11-09 DIAGNOSIS — F329 Major depressive disorder, single episode, unspecified: Secondary | ICD-10-CM | POA: Insufficient documentation

## 2015-05-31 DIAGNOSIS — H00014 Hordeolum externum left upper eyelid: Secondary | ICD-10-CM | POA: Insufficient documentation

## 2015-06-29 DIAGNOSIS — D5709 Hb-ss disease with crisis with other specified complication: Secondary | ICD-10-CM | POA: Insufficient documentation

## 2015-06-29 DIAGNOSIS — D571 Sickle-cell disease without crisis: Secondary | ICD-10-CM | POA: Insufficient documentation

## 2015-07-11 DIAGNOSIS — Z9189 Other specified personal risk factors, not elsewhere classified: Secondary | ICD-10-CM | POA: Insufficient documentation

## 2016-02-13 IMAGING — DX DG SHOULDER 2+V*L*
1 series · 1 of 1 positions shown · non-contrast
Comparison: None.

CLINICAL DATA: Sickle cell crisis; pt c/o left shoulder pain since
yesterday, no injury; pain is posterior; imaging performed supine
due to fall risk--pt was unable to oblique for grashey view due to
extreme pain

EXAM:
LEFT SHOULDER - 2+ VIEW

[oblique]
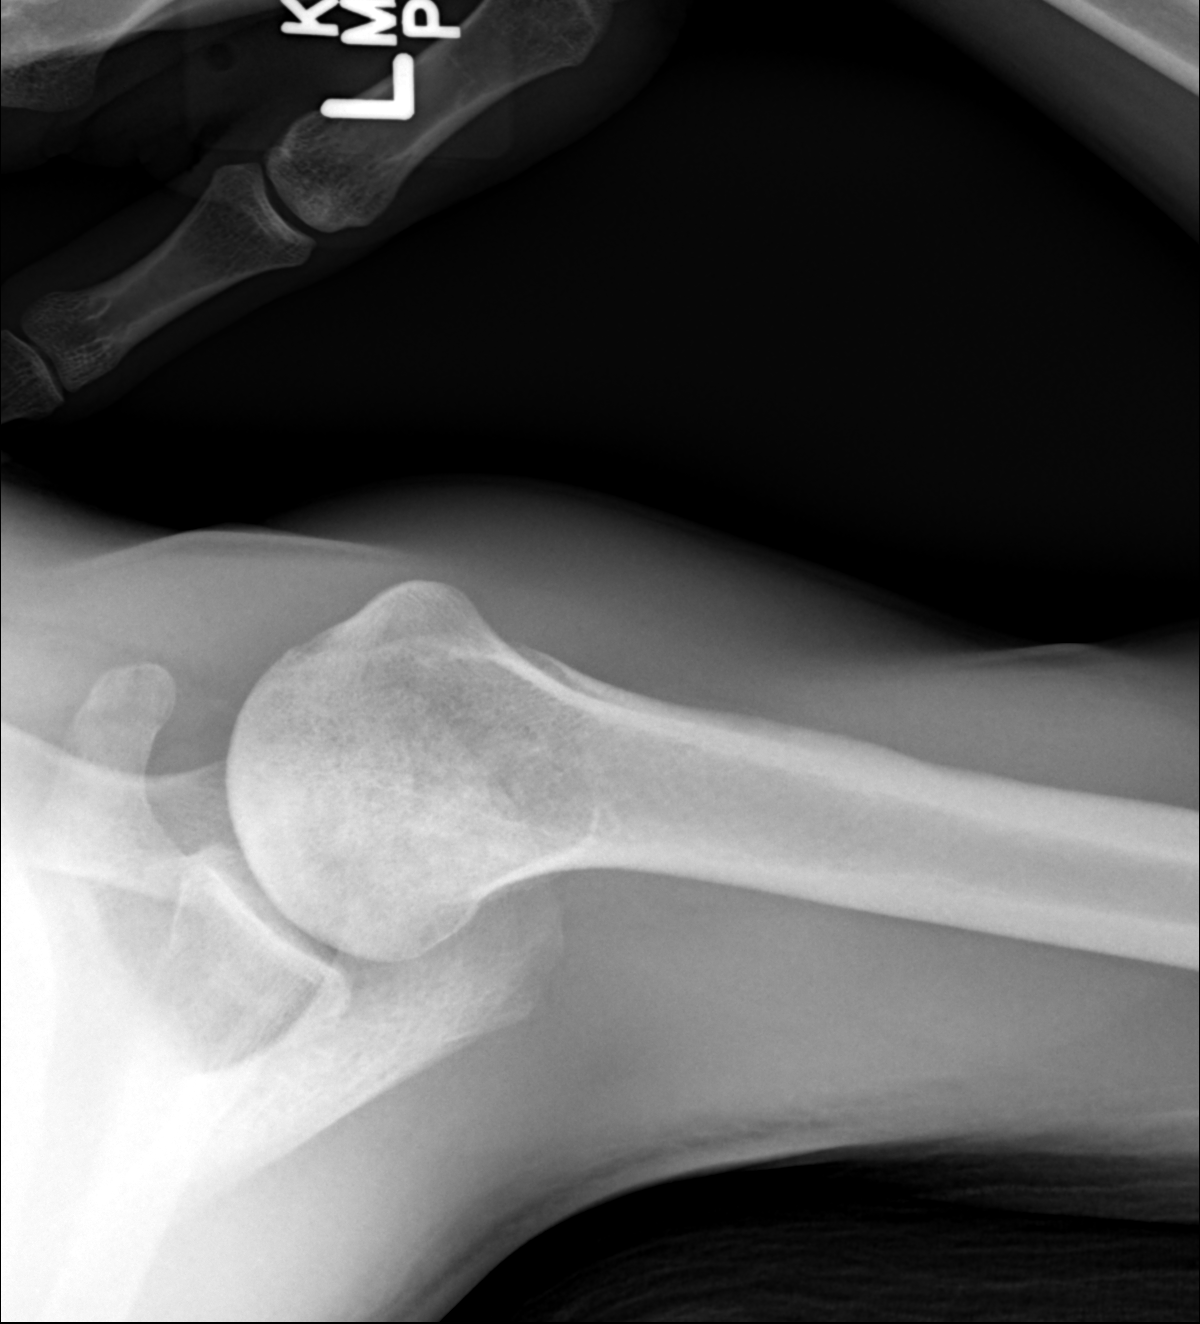

[1 of 1 positions shown; findings below may reference images not displayed]

FINDINGS: There is no evidence of fracture or dislocation. There is no
evidence of arthropathy or other focal bone abnormality. Soft
tissues are unremarkable.
IMPRESSION: Negative.

## 2016-02-15 DIAGNOSIS — M90512 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, left shoulder: Secondary | ICD-10-CM

## 2016-02-15 HISTORY — DX: 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, left shoulder: M90.512

## 2016-06-12 IMAGING — DX DG CHEST 1V PORT
1 series · 1 of 1 positions shown · non-contrast
Comparison: Chest radiograph performed 07/31/2014

CLINICAL DATA: Acute onset of generalized chest pain. Initial
encounter.

EXAM:
PORTABLE CHEST - 1 VIEW

[chest ap]
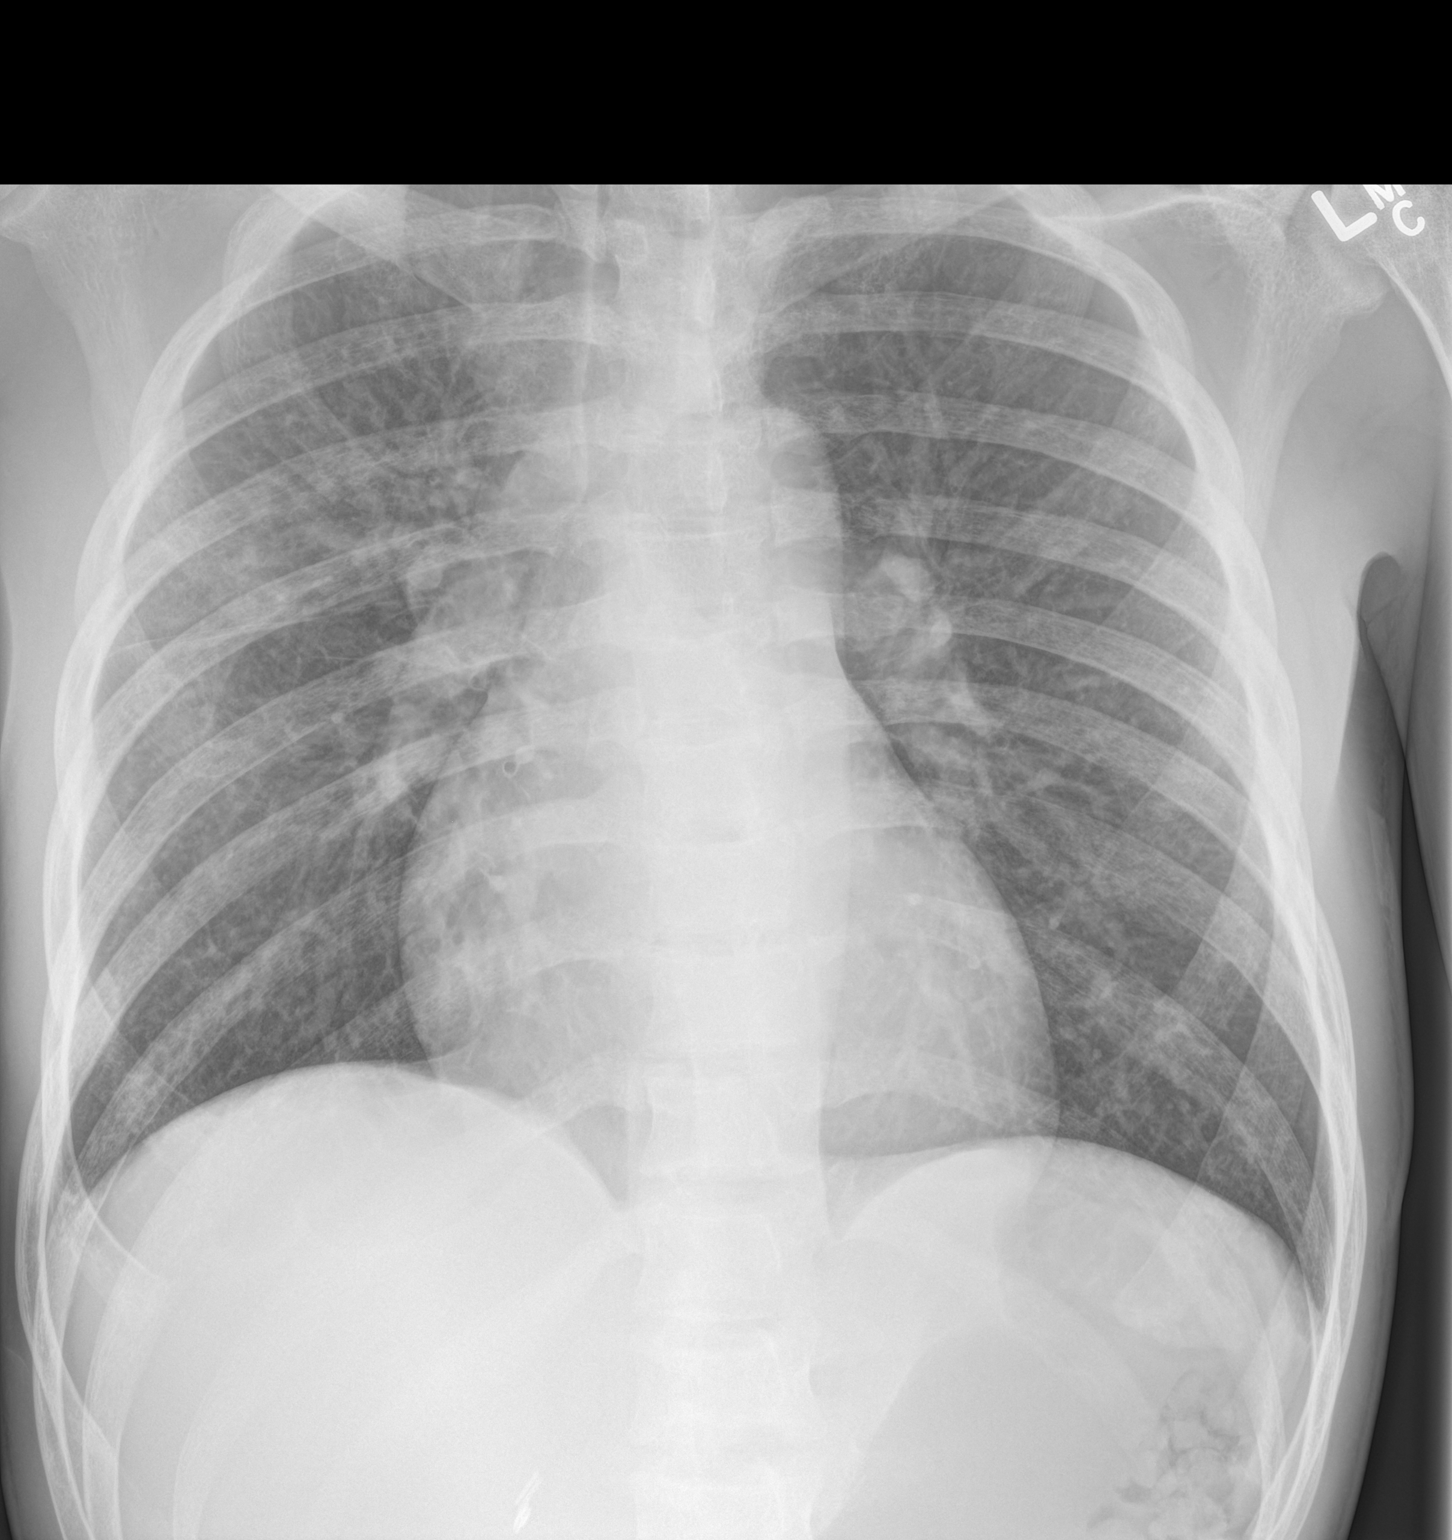

[1 of 1 positions shown; findings below may reference images not displayed]

FINDINGS: The lungs are well-aerated. Vascular congestion is noted. Mild
right-sided airspace opacities may reflect acute chest syndrome or
mild pneumonia. No pleural effusion or pneumothorax is seen.

The cardiomediastinal silhouette is within normal limits. No acute
osseous abnormalities are seen.
IMPRESSION: Vascular congestion noted. Mild right-sided airspace opacities may
reflect acute chest syndrome or mild pneumonia.

## 2016-08-02 DIAGNOSIS — I82409 Acute embolism and thrombosis of unspecified deep veins of unspecified lower extremity: Secondary | ICD-10-CM | POA: Insufficient documentation

## 2016-10-09 DIAGNOSIS — R701 Abnormal plasma viscosity: Secondary | ICD-10-CM | POA: Insufficient documentation

## 2016-10-11 DIAGNOSIS — R45851 Suicidal ideations: Secondary | ICD-10-CM | POA: Insufficient documentation

## 2016-11-17 DIAGNOSIS — Z86718 Personal history of other venous thrombosis and embolism: Secondary | ICD-10-CM | POA: Insufficient documentation

## 2016-11-17 HISTORY — DX: Personal history of other venous thrombosis and embolism: Z86.718

## 2016-11-21 DIAGNOSIS — R918 Other nonspecific abnormal finding of lung field: Secondary | ICD-10-CM | POA: Insufficient documentation

## 2017-03-25 ENCOUNTER — Emergency Department (HOSPITAL_COMMUNITY)
Admission: EM | Admit: 2017-03-25 | Discharge: 2017-03-25 | Disposition: A | Discharge status: Home / Self Care | Payer: Medicaid Other | Attending: Emergency Medicine | Admitting: Emergency Medicine

## 2017-03-25 ENCOUNTER — Encounter (HOSPITAL_COMMUNITY): Payer: Self-pay

## 2017-03-25 DIAGNOSIS — D57219 Sickle-cell/Hb-C disease with crisis, unspecified: Secondary | ICD-10-CM | POA: Insufficient documentation

## 2017-03-25 DIAGNOSIS — Z7982 Long term (current) use of aspirin: Secondary | ICD-10-CM | POA: Insufficient documentation

## 2017-03-25 DIAGNOSIS — D5700 Hb-SS disease with crisis, unspecified: Secondary | ICD-10-CM

## 2017-03-25 LAB — CBC WITH DIFFERENTIAL/PLATELET
Basophils Absolute: 0 10*3/uL (ref 0.0–0.1)
Basophils Relative: 0 %
Eosinophils Absolute: 0.1 10*3/uL (ref 0.0–0.7)
Eosinophils Relative: 1 %
HCT: 35.4 % — ABNORMAL LOW (ref 39.0–52.0)
Hemoglobin: 12.3 g/dL — ABNORMAL LOW (ref 13.0–17.0)
Lymphocytes Relative: 25 %
Lymphs Abs: 2.2 10*3/uL (ref 0.7–4.0)
MCH: 30.8 pg (ref 26.0–34.0)
MCHC: 34.7 g/dL (ref 30.0–36.0)
MCV: 88.5 fL (ref 78.0–100.0)
Monocytes Absolute: 0.8 10*3/uL (ref 0.1–1.0)
Monocytes Relative: 10 %
Neutro Abs: 5.7 10*3/uL (ref 1.7–7.7)
Neutrophils Relative %: 64 %
Platelets: 317 10*3/uL (ref 150–400)
RBC: 4 MIL/uL — ABNORMAL LOW (ref 4.22–5.81)
RDW: 19.1 % — ABNORMAL HIGH (ref 11.5–15.5)
WBC: 8.8 10*3/uL (ref 4.0–10.5)

## 2017-03-25 LAB — COMPREHENSIVE METABOLIC PANEL
ALT: 16 U/L — ABNORMAL LOW (ref 17–63)
AST: 46 U/L — ABNORMAL HIGH (ref 15–41)
Albumin: 3.9 g/dL (ref 3.5–5.0)
Alkaline Phosphatase: 60 U/L (ref 38–126)
Anion gap: 12 (ref 5–15)
BUN: <5 mg/dL — ABNORMAL LOW (ref 6–20)
CO2: 20 mmol/L — ABNORMAL LOW (ref 22–32)
Calcium: 8.7 mg/dL — ABNORMAL LOW (ref 8.9–10.3)
Chloride: 105 mmol/L (ref 101–111)
Creatinine, Ser: 0.56 mg/dL — ABNORMAL LOW (ref 0.61–1.24)
GFR calc Af Amer: >60 mL/min (ref >60)
GFR calc non Af Amer: >60 mL/min (ref >60)
Glucose, Bld: 83 mg/dL (ref 65–99)
Potassium: 4.1 mmol/L (ref 3.5–5.1)
Sodium: 137 mmol/L (ref 135–145)
Total Bilirubin: 2.1 mg/dL — ABNORMAL HIGH (ref 0.3–1.2)
Total Protein: 7.3 g/dL (ref 6.5–8.1)

## 2017-03-25 LAB — RETICULOCYTES
RBC.: 4 MIL/uL — ABNORMAL LOW (ref 4.22–5.81)
Retic Count, Absolute: 176 10*3/uL (ref 19.0–186.0)
Retic Ct Pct: 4.4 % — ABNORMAL HIGH (ref 0.4–3.1)

## 2017-03-25 MED ORDER — HYDROMORPHONE HCL 1 MG/ML IJ SOLN
1.0000 mg | Freq: Once | INTRAMUSCULAR | Status: AC
  Administered 2017-03-25: 1 mg via INTRAVENOUS
  Filled 2017-03-25: qty 1

## 2017-03-25 MED ORDER — SODIUM CHLORIDE 0.45 % IV SOLN
INTRAVENOUS | Status: DC
  Administered 2017-03-25 (×2): via INTRAVENOUS

## 2017-03-25 MED ORDER — ONDANSETRON HCL 4 MG/2ML IJ SOLN
4.0000 mg | INTRAMUSCULAR | Status: DC | PRN
  Administered 2017-03-25: 4 mg via INTRAVENOUS
  Filled 2017-03-25: qty 2

## 2017-03-25 MED ORDER — MORPHINE SULFATE (PF) 4 MG/ML IV SOLN
6.0000 mg | Freq: Once | INTRAVENOUS | Status: AC
  Administered 2017-03-25: 6 mg via INTRAVENOUS
  Filled 2017-03-25: qty 2

## 2017-03-25 MED ORDER — MORPHINE SULFATE (PF) 4 MG/ML IV SOLN
6.0000 mg | Freq: Once | INTRAVENOUS | Status: DC
  Filled 2017-03-25: qty 2

## 2017-03-25 MED ORDER — DIPHENHYDRAMINE HCL 50 MG/ML IJ SOLN
25.0000 mg | Freq: Once | INTRAMUSCULAR | Status: AC
Start: 2017-03-25 — End: 2017-03-25
  Administered 2017-03-25: 25 mg via INTRAVENOUS
  Filled 2017-03-25: qty 1

## 2017-03-25 NOTE — ED Provider Notes (Signed)
[facility] EMERGENCY DEPARTMENT Provider Note   CSN: [id number] Arrival date & time: 03/25/17  0753     History   Chief Complaint Chief Complaint  Patient presents with  . Sickle Cell Pain Crisis    HPI Timothy Marks is a 27 y.o. male.  The history is provided by the patient and medical records. No language interpreter was used.   Timothy Marks is a 27 y.o. male  with a PMH of sickle cell anemia who presents to the Emergency Department complaining of low back pain consistent with his typical sickle cell crises.  Patient reports pain started around midnight last night.  He has tried his home Percocet without any improvement.  He also takes folic acid and hydroxyurea.  No fever, chills, numbness, tingling, saddle anesthesia, bowel or bladder incontinence.  No cough, congestion, chest pain or shortness of breath.  Past Medical History:  Diagnosis Date  . Depression   . 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 Sickle cell anemia (HCC)   . Vitamin D deficiency y-6    Patient Active Problem List   Diagnosis Date Noted  . Constipation 08/03/2014  . Hb-SS disease with crisis ([place]) 08/01/2014  . Leukocytosis 06/30/2014  . Fever 05/07/2014  . Pain and swelling of left knee 05/07/2014  . Sickle cell anemia with crisis ([place]) 05/06/2014  . 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 Protein-calorie malnutrition, severe ([place]) 04/09/2014  . Major depressive disorder, recurrent, severe without psychotic features ([place])   . 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 Vitamin D deficiency   . Sickle cell pain crisis ([place]) 03/30/2014  . Sickle cell anemia ([place]) 03/30/2014  . Hyperbilirubinemia 03/30/2014  . Hypokalemia 02/07/2014  . Sickle cell crisis ([place]) 03/03/2013  . Aggressive behavior 01/25/2013    Past Surgical History:  Procedure Laterality Date  . CHOLECYSTECTOMY         Home  Medications    Prior to Admission medications   Medication Sig Start Date End Date Taking? Authorizing Provider  folic acid (FOLVITE) 1 MG tablet Take 1 mg by mouth daily.   Yes [provider]  hydroxyurea (HYDREA) 500 MG capsule Take 500 mg by mouth 2 (two) times daily. May take with food to minimize GI side effects.   Yes [provider]  oxyCODONE-acetaminophen (PERCOCET) 10-325 MG per tablet Take 1 tablet by mouth every 4 (four) hours as needed for pain. 07/07/14  Yes Linton Flemings, MD  valACYclovir (VALTREX) 500 MG tablet Take 1 tablet (500 mg total) by mouth 2 (two) times daily. 08/11/14  Yes Elwyn Reach, MD  aspirin 81 MG chewable tablet Chew 1 tablet (81 mg total) by mouth daily. Patient not taking: Reported on 05/05/2014 04/09/14   Leana Gamer, MD  cholecalciferol (VITAMIN D) 1000 UNITS tablet Take 2 tablets (2,000 Units total) by mouth daily. Patient not taking: Reported on 05/05/2014 04/09/14   Leana Gamer, MD  diclofenac sodium (VOLTAREN) 1 % GEL Apply 2 g topically 4 (four) times daily. Patient not taking: Reported on 05/05/2014 04/09/14   Leana Gamer, MD  escitalopram (LEXAPRO) 10 MG tablet Take 1 tablet (10 mg total) by mouth daily. Patient not taking: Reported on 05/05/2014 04/09/14   Leana Gamer, MD  feeding supplement, ENSURE COMPLETE, (ENSURE COMPLETE) LIQD Take 237 mLs by mouth 3 (three) times daily between meals. Patient not taking: Reported on 05/05/2014 04/09/14  Leana Gamer, MD  hydrOXYzine (ATARAX/VISTARIL) 50 MG tablet Take 1 tablet (50 mg total) by mouth at bedtime. Patient not taking: Reported on 05/05/2014 04/09/14   Leana Gamer, MD  polyethylene glycol [facility] / Floria Raveling) packet Take 17 g by mouth daily as needed for mild constipation. Patient not taking: Reported on 05/05/2014 04/09/14   Leana Gamer, MD  senna-docusate (SENOKOT-S) 8.6-50 MG per tablet Take 1 tablet by mouth 2 (two) times  daily. Patient not taking: Reported on 05/05/2014 04/09/14   Leana Gamer, MD    Family History Family History  Problem Relation Age of Onset  . Sickle cell trait Mother   . Sickle cell trait Father     Social History Social History   Tobacco Use  . Smoking status: Never Smoker  . Smokeless tobacco: Never Used  Substance Use Topics  . Alcohol use: No  . Drug use: Yes    Types: Marijuana     Allergies   Levaquin [levofloxacin]; Morphine and related; Toradol [ketorolac tromethamine]; Tramadol; Vancomycin; and Zosyn [piperacillin sod-tazobactam so]   Review of Systems Review of Systems  Musculoskeletal: Positive for arthralgias and myalgias.  All other systems reviewed and are negative.    Physical Exam Updated Vital Signs BP 112/62   Pulse 70   Temp 97.8 F (36.6 C) (Oral)   Resp 16   SpO2 100%   Physical Exam  Constitutional: He is oriented to person, place, and time. He appears well-developed and well-nourished. No distress.  HENT:  Head: Normocephalic and atraumatic.  Cardiovascular: Normal rate, regular rhythm and normal heart sounds.  No murmur heard. Pulmonary/Chest: Effort normal and breath sounds normal. No respiratory distress.  Abdominal: Soft. He exhibits no distension. There is no tenderness.  Musculoskeletal:  Diffuse tenderness along lower back.  No overlying skin changes.  Straight leg raise is negative bilaterally.  5/5 muscle strength in bilateral lower extremities.  Neurological: He is alert and oriented to person, place, and time.  Bilateral lower extremities neurovascularly intact.  Skin: Skin is warm and dry.  Nursing note and vitals reviewed.    ED Treatments / Results  Labs (all labs ordered are listed, but only abnormal results are displayed) Labs Reviewed  COMPREHENSIVE METABOLIC PANEL - Abnormal; Notable for the following components:      Result Value   CO2 20 (*)    BUN <5 (*)    Creatinine, Ser 0.56 (*)    Calcium  8.7 (*)    AST 46 (*)    ALT 16 (*)    Total Bilirubin 2.1 (*)    All other components within normal limits  CBC WITH DIFFERENTIAL/PLATELET - Abnormal; Notable for the following components:   RBC 4.00 (*)    Hemoglobin 12.3 (*)    HCT 35.4 (*)    RDW 19.1 (*)    All other components within normal limits  RETICULOCYTES - Abnormal; Notable for the following components:   Retic Ct Pct 4.4 (*)    RBC. 4.00 (*)    All other components within normal limits    EKG  EKG Interpretation None       Radiology No results found.  Procedures Procedures (including critical care time)  Medications Ordered in ED Medications  0.45 % sodium chloride infusion ( Intravenous New Bag/Given 03/25/17 1254)  ondansetron (ZOFRAN) injection 4 mg (4 mg Intravenous Given 03/25/17 1022)  diphenhydrAMINE (BENADRYL) injection 25 mg (25 mg Intravenous Given 03/25/17 1022)  morphine 4 MG/ML injection 6  mg (6 mg Intravenous Given 03/25/17 1023)  HYDROmorphone (DILAUDID) injection 1 mg (1 mg Intravenous Given 03/25/17 1135)  HYDROmorphone (DILAUDID) injection 1 mg (1 mg Intravenous Given 03/25/17 1254)     Initial Impression / Assessment and Plan / ED Course  I have reviewed the triage vital signs and the nursing notes.  Pertinent labs & imaging results that were available during my care of the patient were reviewed by me and considered in my medical decision making (see chart for details).    Maurion Walkowiak is a 27 y.o. male who presents to ED for pain c/w typical sickle cell crisis. No shortness of breath, fevers or signs of acute chest. Patient otherwise in no acute distress objectively other than complaint of pain. Given IV fluids and pain medication. Will obtain labs and continue to monitor with admit vs. Discharge based on response and results of testing.   Labs baseline.  Patient re-evaluated and feels much improved. Comfortable with discharge to home. Understands to follow up with PCP and reasons to return  to ER. All questions answered.   Final Clinical Impressions(s) / ED Diagnoses   Final diagnoses:  Sickle cell pain crisis [facility])    ED Discharge Orders    None       Alfard Cochrane, Ozella Almond, PA-C 03/25/17 1332    Margette Fast, MD 03/25/17 2003

## 2017-03-25 NOTE — ED Triage Notes (Signed)
Patient complains of back pain since midnight that he relates to a sickle cell crisis. Denies trauma, no fever. Alert and orienteed

## 2017-03-25 NOTE — ED Notes (Signed)
Pt reports pain in lower back that started last night. NAD.

## 2017-03-25 NOTE — Discharge Instructions (Signed)
It was my pleasure taking care of you today!   Follow up with your primary care provider.   Return to ER for new or worsening symptoms, any additional concerns.

## 2017-03-28 ENCOUNTER — Other Ambulatory Visit: Payer: Self-pay

## 2017-03-28 ENCOUNTER — Inpatient Hospital Stay (HOSPITAL_COMMUNITY)
Admission: EM | Admit: 2017-03-28 | Discharge: 2017-03-29 | DRG: 812 | Disposition: A | Discharge status: Home / Self Care | Payer: Medicaid Other | Attending: Internal Medicine | Admitting: Internal Medicine

## 2017-03-28 ENCOUNTER — Encounter (HOSPITAL_COMMUNITY): Payer: Self-pay | Admitting: Emergency Medicine

## 2017-03-28 DIAGNOSIS — F339 Major depressive disorder, recurrent, unspecified: Secondary | ICD-10-CM | POA: Diagnosis not present

## 2017-03-28 DIAGNOSIS — G894 Chronic pain syndrome: Secondary | ICD-10-CM | POA: Diagnosis present

## 2017-03-28 DIAGNOSIS — F332 Major depressive disorder, recurrent severe without psychotic features: Secondary | ICD-10-CM | POA: Diagnosis present

## 2017-03-28 DIAGNOSIS — F4324 Adjustment disorder with disturbance of conduct: Secondary | ICD-10-CM | POA: Diagnosis present

## 2017-03-28 DIAGNOSIS — Z881 Allergy status to other antibiotic agents status: Secondary | ICD-10-CM

## 2017-03-28 DIAGNOSIS — E559 Vitamin D deficiency, unspecified: Secondary | ICD-10-CM | POA: Diagnosis present

## 2017-03-28 DIAGNOSIS — Z9049 Acquired absence of other specified parts of digestive tract: Secondary | ICD-10-CM | POA: Diagnosis not present

## 2017-03-28 DIAGNOSIS — M90512 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, left shoulder: Secondary | ICD-10-CM | POA: Diagnosis present

## 2017-03-28 DIAGNOSIS — Z832 Family history of diseases of the blood and blood-forming organs and certain disorders involving the immune mechanism: Secondary | ICD-10-CM | POA: Diagnosis not present

## 2017-03-28 DIAGNOSIS — K029 Dental caries, unspecified: Secondary | ICD-10-CM | POA: Diagnosis not present

## 2017-03-28 DIAGNOSIS — K5900 Constipation, unspecified: Secondary | ICD-10-CM | POA: Diagnosis present

## 2017-03-28 DIAGNOSIS — F1210 Cannabis abuse, uncomplicated: Secondary | ICD-10-CM | POA: Diagnosis not present

## 2017-03-28 DIAGNOSIS — E440 Moderate protein-calorie malnutrition: Secondary | ICD-10-CM | POA: Diagnosis not present

## 2017-03-28 DIAGNOSIS — F419 Anxiety disorder, unspecified: Secondary | ICD-10-CM | POA: Diagnosis present

## 2017-03-28 DIAGNOSIS — Z79891 Long term (current) use of opiate analgesic: Secondary | ICD-10-CM | POA: Diagnosis not present

## 2017-03-28 DIAGNOSIS — D72829 Elevated white blood cell count, unspecified: Secondary | ICD-10-CM | POA: Diagnosis present

## 2017-03-28 DIAGNOSIS — Z79899 Other long term (current) drug therapy: Secondary | ICD-10-CM | POA: Diagnosis not present

## 2017-03-28 DIAGNOSIS — Z7982 Long term (current) use of aspirin: Secondary | ICD-10-CM | POA: Diagnosis not present

## 2017-03-28 DIAGNOSIS — D638 Anemia in other chronic diseases classified elsewhere: Secondary | ICD-10-CM | POA: Diagnosis present

## 2017-03-28 DIAGNOSIS — Z681 Body mass index (BMI) 19 or less, adult: Secondary | ICD-10-CM

## 2017-03-28 DIAGNOSIS — Z86718 Personal history of other venous thrombosis and embolism: Secondary | ICD-10-CM

## 2017-03-28 DIAGNOSIS — K5909 Other constipation: Secondary | ICD-10-CM | POA: Diagnosis not present

## 2017-03-28 DIAGNOSIS — E46 Unspecified protein-calorie malnutrition: Secondary | ICD-10-CM | POA: Diagnosis present

## 2017-03-28 DIAGNOSIS — E876 Hypokalemia: Secondary | ICD-10-CM | POA: Diagnosis present

## 2017-03-28 DIAGNOSIS — D571 Sickle-cell disease without crisis: Secondary | ICD-10-CM | POA: Diagnosis present

## 2017-03-28 DIAGNOSIS — Z885 Allergy status to narcotic agent status: Secondary | ICD-10-CM

## 2017-03-28 DIAGNOSIS — R441 Visual hallucinations: Secondary | ICD-10-CM | POA: Diagnosis present

## 2017-03-28 DIAGNOSIS — D5700 Hb-SS disease with crisis, unspecified: Secondary | ICD-10-CM | POA: Diagnosis present

## 2017-03-28 LAB — COMPREHENSIVE METABOLIC PANEL
ALT: 13 U/L — ABNORMAL LOW (ref 17–63)
AST: 29 U/L (ref 15–41)
Albumin: 3.8 g/dL (ref 3.5–5.0)
Alkaline Phosphatase: 65 U/L (ref 38–126)
Anion gap: 12 (ref 5–15)
BUN: 5 mg/dL — ABNORMAL LOW (ref 6–20)
CO2: 25 mmol/L (ref 22–32)
Calcium: 8.9 mg/dL (ref 8.9–10.3)
Chloride: 102 mmol/L (ref 101–111)
Creatinine, Ser: 0.69 mg/dL (ref 0.61–1.24)
GFR calc Af Amer: >60 mL/min (ref >60)
GFR calc non Af Amer: >60 mL/min (ref >60)
Glucose, Bld: 95 mg/dL (ref 65–99)
Potassium: 3.4 mmol/L — ABNORMAL LOW (ref 3.5–5.1)
Sodium: 139 mmol/L (ref 135–145)
Total Bilirubin: 2.5 mg/dL — ABNORMAL HIGH (ref 0.3–1.2)
Total Protein: 7.1 g/dL (ref 6.5–8.1)

## 2017-03-28 LAB — CBC WITH DIFFERENTIAL/PLATELET
Basophils Absolute: 0 10*3/uL (ref 0.0–0.1)
Basophils Relative: 0 %
Eosinophils Absolute: 0.2 10*3/uL (ref 0.0–0.7)
Eosinophils Relative: 2 %
HCT: 33.5 % — ABNORMAL LOW (ref 39.0–52.0)
Hemoglobin: 11.5 g/dL — ABNORMAL LOW (ref 13.0–17.0)
Lymphocytes Relative: 37 %
Lymphs Abs: 3.6 10*3/uL (ref 0.7–4.0)
MCH: 30.3 pg (ref 26.0–34.0)
MCHC: 34.3 g/dL (ref 30.0–36.0)
MCV: 88.2 fL (ref 78.0–100.0)
Monocytes Absolute: 0.9 10*3/uL (ref 0.1–1.0)
Monocytes Relative: 9 %
Neutro Abs: 5.1 10*3/uL (ref 1.7–7.7)
Neutrophils Relative %: 52 %
Platelets: 379 10*3/uL (ref 150–400)
RBC: 3.8 MIL/uL — ABNORMAL LOW (ref 4.22–5.81)
RDW: 19.1 % — ABNORMAL HIGH (ref 11.5–15.5)
WBC: 9.8 10*3/uL (ref 4.0–10.5)

## 2017-03-28 LAB — RETICULOCYTES
RBC.: 3.8 MIL/uL — ABNORMAL LOW (ref 4.22–5.81)
Retic Count, Absolute: 250.8 10*3/uL — ABNORMAL HIGH (ref 19.0–186.0)
Retic Ct Pct: 6.6 % — ABNORMAL HIGH (ref 0.4–3.1)

## 2017-03-28 LAB — HIV ANTIBODY (ROUTINE TESTING W REFLEX): HIV Screen 4th Generation wRfx: NONREACTIVE

## 2017-03-28 MED ORDER — HYDROXYUREA 500 MG PO CAPS
500.0000 mg | ORAL_CAPSULE | Freq: Two times a day (BID) | ORAL | Status: DC
  Administered 2017-03-28 (×2): 500 mg via ORAL
  Filled 2017-03-28 (×3): qty 1

## 2017-03-28 MED ORDER — SODIUM CHLORIDE 0.9% FLUSH: 9.0000 mL | INTRAVENOUS | Status: DC | PRN

## 2017-03-28 MED ORDER — PROMETHAZINE HCL 25 MG PO TABS: 12.5000 mg | ORAL_TABLET | ORAL | Status: DC | PRN

## 2017-03-28 MED ORDER — SODIUM CHLORIDE 0.45 % IV SOLN
INTRAVENOUS | Status: DC
  Administered 2017-03-28: 10:00:00 via INTRAVENOUS
  Administered 2017-03-28: 100 mL/h via INTRAVENOUS

## 2017-03-28 MED ORDER — HYDROMORPHONE 1 MG/ML IV SOLN
INTRAVENOUS | Status: DC
  Administered 2017-03-28: 14:00:00 via INTRAVENOUS
  Filled 2017-03-28: qty 25

## 2017-03-28 MED ORDER — HYDROMORPHONE HCL 1 MG/ML IJ SOLN
2.0000 mg | Freq: Once | INTRAMUSCULAR | Status: AC
  Administered 2017-03-28: 2 mg via INTRAVENOUS
  Filled 2017-03-28: qty 2

## 2017-03-28 MED ORDER — HYDROMORPHONE HCL 1 MG/ML IJ SOLN
1.0000 mg | INTRAMUSCULAR | Status: AC
  Administered 2017-03-28: 1 mg via INTRAVENOUS
  Filled 2017-03-28: qty 1

## 2017-03-28 MED ORDER — HYDROMORPHONE HCL 1 MG/ML IJ SOLN: 0.5000 mg | INTRAMUSCULAR | Status: AC

## 2017-03-28 MED ORDER — KETOROLAC TROMETHAMINE 15 MG/ML IJ SOLN: 15.0000 mg | Freq: Four times a day (QID) | INTRAMUSCULAR | Status: DC

## 2017-03-28 MED ORDER — SENNOSIDES-DOCUSATE SODIUM 8.6-50 MG PO TABS
1.0000 | ORAL_TABLET | Freq: Two times a day (BID) | ORAL | Status: DC
  Administered 2017-03-28 (×2): 1 via ORAL
  Filled 2017-03-28 (×2): qty 1

## 2017-03-28 MED ORDER — ESCITALOPRAM OXALATE 10 MG PO TABS: 10.0000 mg | ORAL_TABLET | Freq: Every day | ORAL | Status: DC

## 2017-03-28 MED ORDER — HYDROMORPHONE HCL 1 MG/ML IJ SOLN
1.0000 mg | Freq: Once | INTRAMUSCULAR | Status: DC
  Filled 2017-03-28: qty 1

## 2017-03-28 MED ORDER — FOLIC ACID 1 MG PO TABS
1.0000 mg | ORAL_TABLET | Freq: Every day | ORAL | Status: DC
  Administered 2017-03-28: 1 mg via ORAL
  Filled 2017-03-28: qty 1

## 2017-03-28 MED ORDER — DIPHENHYDRAMINE HCL 25 MG PO CAPS
25.0000 mg | ORAL_CAPSULE | ORAL | Status: DC | PRN
  Administered 2017-03-28: 25 mg via ORAL
  Filled 2017-03-28: qty 1

## 2017-03-28 MED ORDER — LORAZEPAM 2 MG/ML IJ SOLN
1.0000 mg | Freq: Once | INTRAMUSCULAR | Status: AC
  Administered 2017-03-28: 1 mg via INTRAVENOUS
  Filled 2017-03-28: qty 1

## 2017-03-28 MED ORDER — NALOXONE HCL 0.4 MG/ML IJ SOLN: 0.4000 mg | INTRAMUSCULAR | Status: DC | PRN

## 2017-03-28 MED ORDER — LORAZEPAM 2 MG/ML IJ SOLN
2.0000 mg | Freq: Once | INTRAMUSCULAR | Status: AC
  Administered 2017-03-28: 2 mg via INTRAVENOUS
  Filled 2017-03-28: qty 1

## 2017-03-28 MED ORDER — POLYETHYLENE GLYCOL 3350 17 G PO PACK: 17.0000 g | PACK | Freq: Every day | ORAL | Status: DC | PRN

## 2017-03-28 MED ORDER — HYDROMORPHONE HCL 1 MG/ML IJ SOLN: 1.0000 mg | INTRAMUSCULAR | Status: AC

## 2017-03-28 MED ORDER — PROMETHAZINE HCL 12.5 MG RE SUPP: 12.5000 mg | RECTAL | Status: DC | PRN

## 2017-03-28 MED ORDER — KETOROLAC TROMETHAMINE 30 MG/ML IJ SOLN
30.0000 mg | Freq: Four times a day (QID) | INTRAMUSCULAR | Status: DC
  Administered 2017-03-28 – 2017-03-29 (×2): 30 mg via INTRAVENOUS
  Filled 2017-03-28 (×2): qty 1

## 2017-03-28 MED ORDER — HYDROMORPHONE HCL 1 MG/ML IJ SOLN
0.5000 mg | INTRAMUSCULAR | Status: AC
  Administered 2017-03-28: 0.5 mg via INTRAVENOUS
  Filled 2017-03-28: qty 1

## 2017-03-28 MED ORDER — ENSURE COMPLETE PO LIQD: 237.0000 mL | Freq: Three times a day (TID) | ORAL | Status: DC

## 2017-03-28 MED ORDER — HYDROMORPHONE HCL 1 MG/ML IJ SOLN
1.0000 mg | Freq: Once | INTRAMUSCULAR | Status: AC
  Administered 2017-03-28: 1 mg via INTRAVENOUS
  Filled 2017-03-28: qty 1

## 2017-03-28 MED ORDER — POTASSIUM CHLORIDE CRYS ER 20 MEQ PO TBCR
40.0000 meq | EXTENDED_RELEASE_TABLET | Freq: Two times a day (BID) | ORAL | Status: AC
  Administered 2017-03-28 (×2): 40 meq via ORAL
  Filled 2017-03-28 (×2): qty 2

## 2017-03-28 MED ORDER — ENOXAPARIN SODIUM 40 MG/0.4ML ~~LOC~~ SOLN
40.0000 mg | SUBCUTANEOUS | Status: DC
  Administered 2017-03-28: 40 mg via SUBCUTANEOUS
  Filled 2017-03-28 (×3): qty 0.4

## 2017-03-28 MED ORDER — DIPHENHYDRAMINE HCL 25 MG PO CAPS: 25.0000 mg | ORAL_CAPSULE | ORAL | Status: DC | PRN

## 2017-03-28 MED ORDER — HYDROMORPHONE HCL 1 MG/ML IJ SOLN: 0.5000 mg | INTRAMUSCULAR | Status: DC | PRN

## 2017-03-28 MED ORDER — ONDANSETRON HCL 4 MG/2ML IJ SOLN: 4.0000 mg | Freq: Four times a day (QID) | INTRAMUSCULAR | Status: DC | PRN

## 2017-03-28 MED ORDER — HYDROMORPHONE HCL 1 MG/ML IJ SOLN
1.0000 mg | INTRAMUSCULAR | Status: AC
Start: 2017-03-28 — End: 2017-03-28
  Administered 2017-03-28: 1 mg via INTRAVENOUS
  Filled 2017-03-28: qty 1

## 2017-03-28 MED ORDER — HYDROXYZINE HCL 50 MG PO TABS: 50.0000 mg | ORAL_TABLET | Freq: Every day | ORAL | Status: DC

## 2017-03-28 MED ORDER — OXYCODONE-ACETAMINOPHEN 10-325 MG PO TABS
1.0000 | ORAL_TABLET | ORAL | Status: DC | PRN
Start: 2017-03-28 — End: 2017-03-28

## 2017-03-28 MED ORDER — VITAMIN D 1000 UNITS PO TABS
2000.0000 [IU] | ORAL_TABLET | Freq: Every day | ORAL | Status: DC
  Administered 2017-03-28: 2000 [IU] via ORAL
  Filled 2017-03-28: qty 2

## 2017-03-28 MED ORDER — SENNOSIDES-DOCUSATE SODIUM 8.6-50 MG PO TABS: 1.0000 | ORAL_TABLET | Freq: Two times a day (BID) | ORAL | Status: DC

## 2017-03-28 MED ORDER — LORAZEPAM 2 MG/ML IJ SOLN
1.0000 mg | Freq: Once | INTRAMUSCULAR | Status: DC | PRN
  Filled 2017-03-28: qty 1

## 2017-03-28 MED ORDER — DIPHENHYDRAMINE HCL 50 MG/ML IJ SOLN
25.0000 mg | INTRAMUSCULAR | Status: DC | PRN
  Administered 2017-03-28: 25 mg via INTRAVENOUS
  Filled 2017-03-28: qty 0.5

## 2017-03-28 NOTE — Progress Notes (Addendum)
When attempting to finish writing my RRT note, PCA stopped by pump d/t pt refusing to wear oxygen tubing which had the ETCO2 probe on it.  Without a ETCO2 reading PCA pump will not run.  Attempted to tell this to patient and to try to reason with patient and convince pt to wear his oxygen nasal cannula so he can get his pain medication.  During the time it took for me to write the RRT note, PCA alarmed 7 times d/t pt taking off oxygen with him now flat out refusing to wear, regardless of educating.  Pt wants me to get out of his room and leave him alone.  Due to patient refusing to wear ETCO2 probe, and PCA not able to run if not wearing, I am following policy and turned PCA off and notified NP to see how she would like me to proceed.  Per PCA history from 1500 to 2356 pt received a total of 3mg   Discussed situation with Fransisca Connors, she is ok with turning the PCA pump off and d/c'ing PCA orders.  She advised will place a PRN order for Dilaudid and we will assess how much he needs for pain throughout the night and she will adjust order if needed.  [place] ICU/SD Care Coordinator / Rapid Response Nurse

## 2017-03-28 NOTE — ED Notes (Signed)
RN called pharmacy to release PCA

## 2017-03-28 NOTE — ED Notes (Signed)
PT is not making sense; pulling off CO 2 monitor. Alarms sounding. RN went into patient's room, he is delusional talking about trunk of his car and getting tape out to tape wires of monitor together. Not able to get medicine since not wearing CO2 monitor. Santiago Glad, NP informed.

## 2017-03-28 NOTE — ED Provider Notes (Signed)
[facility] EMERGENCY DEPARTMENT Provider Note   CSN: [id number] Arrival date & time: 03/28/17  9476     History   Chief Complaint Chief Complaint  Patient presents with  . Sickle Cell Pain Crisis    HPI Timothy Marks is a 27 y.o. male.  Patient presents with pain in lower back typical of pain with sickle cell crises. No fever, SOB, chest pain. The back pain does not radiate. There is no bladder/bowel incontinence. No abdominal pain, nausea or vomiting. He has been compliant with his medications at home.    The history is provided by the patient. No language interpreter was used.  Sickle Cell Pain Crisis  Associated symptoms: no chest pain, no fever, no nausea and no shortness of breath     Past Medical History:  Diagnosis Date  . Depression   . 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 Sickle cell anemia (HCC)   . Vitamin D deficiency y-6    Patient Active Problem List   Diagnosis Date Noted  . Constipation 08/03/2014  . Hb-SS disease with crisis ([place]) 08/01/2014  . Leukocytosis 06/30/2014  . Fever 05/07/2014  . Pain and swelling of left knee 05/07/2014  . Sickle cell anemia with crisis ([place]) 05/06/2014  . 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 Protein-calorie malnutrition, severe ([place]) 04/09/2014  . Major depressive disorder, recurrent, severe without psychotic features ([place])   . 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 Vitamin D deficiency   . Sickle cell pain crisis ([place]) 03/30/2014  . Sickle cell anemia ([place]) 03/30/2014  . Hyperbilirubinemia 03/30/2014  . Hypokalemia 02/07/2014  . Sickle cell crisis ([place]) 03/03/2013  . Aggressive behavior 01/25/2013    Past Surgical History:  Procedure Laterality Date  . CHOLECYSTECTOMY         Home Medications    Prior to Admission medications   Medication Sig Start Date End Date Taking? Authorizing Provider  folic  acid (FOLVITE) 1 MG tablet Take 1 mg by mouth daily.   Yes [provider]  hydroxyurea (HYDREA) 500 MG capsule Take 500 mg by mouth 2 (two) times daily. May take with food to minimize GI side effects.   Yes [provider]  aspirin 81 MG chewable tablet Chew 1 tablet (81 mg total) by mouth daily. Patient not taking: Reported on 05/05/2014 04/09/14   Leana Gamer, MD  cholecalciferol (VITAMIN D) 1000 UNITS tablet Take 2 tablets (2,000 Units total) by mouth daily. Patient not taking: Reported on 05/05/2014 04/09/14   Leana Gamer, MD  diclofenac sodium (VOLTAREN) 1 % GEL Apply 2 g topically 4 (four) times daily. Patient not taking: Reported on 05/05/2014 04/09/14   Leana Gamer, MD  escitalopram (LEXAPRO) 10 MG tablet Take 1 tablet (10 mg total) by mouth daily. Patient not taking: Reported on 05/05/2014 04/09/14   Leana Gamer, MD  feeding supplement, ENSURE COMPLETE, (ENSURE COMPLETE) LIQD Take 237 mLs by mouth 3 (three) times daily between meals. Patient not taking: Reported on 05/05/2014 04/09/14   Leana Gamer, MD  hydrOXYzine (ATARAX/VISTARIL) 50 MG tablet Take 1 tablet (50 mg total) by mouth at bedtime. Patient not taking: Reported on 05/05/2014 04/09/14   Leana Gamer, MD  oxyCODONE-acetaminophen (PERCOCET) 10-325 MG per tablet Take 1 tablet by mouth every 4 (four) hours as needed for pain. Patient not taking: Reported on 03/28/2017 07/07/14   Linton Flemings, MD  polyethylene glycol (MIRALAX / GLYCOLAX) packet Take 17 g by mouth daily as needed for mild constipation. Patient not taking: Reported on 05/05/2014 04/09/14   Leana Gamer, MD  senna-docusate (SENOKOT-S) 8.6-50 MG per tablet Take 1 tablet by mouth 2 (two) times daily. Patient not taking: Reported on 05/05/2014 04/09/14   Leana Gamer, MD  valACYclovir (VALTREX) 500 MG tablet Take 1 tablet (500 mg total) by mouth 2 (two) times daily. Patient not taking: Reported on  03/28/2017 08/11/14   Elwyn Reach, MD    Family History Family History  Problem Relation Age of Onset  . Sickle cell trait Mother   . Sickle cell trait Father     Social History Social History   Tobacco Use  . Smoking status: Never Smoker  . Smokeless tobacco: Never Used  Substance Use Topics  . Alcohol use: No  . Drug use: Yes    Types: Marijuana     Allergies   Levaquin [levofloxacin]; Morphine and related; Toradol [ketorolac tromethamine]; Tramadol; Vancomycin; and Zosyn [piperacillin sod-tazobactam so]   Review of Systems Review of Systems  Constitutional: Negative for chills and fever.  Respiratory: Negative.  Negative for shortness of breath.   Cardiovascular: Negative.  Negative for chest pain.  Gastrointestinal: Negative.  Negative for abdominal pain and nausea.  Genitourinary: Negative for enuresis.  Musculoskeletal: Positive for back pain.  Skin: Negative.  Negative for color change.  Neurological: Negative.  Negative for weakness and numbness.     Physical Exam Updated Vital Signs BP 114/76 (BP Location: Right Arm)   Pulse 71   Temp 98.2 F (36.8 C) (Oral)   Resp 16   Ht 6\' 2"  (1.88 m)   Wt 65.8 kg (145 lb)   SpO2 98%   BMI 18.62 kg/m   Physical Exam  Constitutional: He is oriented to person, place, and time. He appears well-developed and well-nourished.  HENT:  Head: Normocephalic.  Neck: Normal range of motion. Neck supple.  Cardiovascular: Normal rate and regular rhythm.  Pulmonary/Chest: Effort normal and breath sounds normal.  Abdominal: Soft. Bowel sounds are normal. There is no tenderness. There is no rebound and no guarding.  Musculoskeletal: Normal range of motion.  Tender across lower back without swelling or redness.   Neurological: He is alert and oriented to person, place, and time.  Skin: Skin is warm and dry. No rash noted.  Psychiatric: He has a normal mood and affect.     ED Treatments / Results  Labs (all labs  ordered are listed, but only abnormal results are displayed) Labs Reviewed  COMPREHENSIVE METABOLIC PANEL - Abnormal; Notable for the following components:      Result Value   Potassium 3.4 (*)    BUN 5 (*)    ALT 13 (*)    Total Bilirubin 2.5 (*)    All other components within normal limits  CBC WITH DIFFERENTIAL/PLATELET - Abnormal; Notable for the following components:   RBC 3.80 (*)    Hemoglobin 11.5 (*)    HCT 33.5 (*)    RDW 19.1 (*)    All other components within normal limits  RETICULOCYTES - Abnormal; Notable for the following components:   Retic Ct Pct 6.6 (*)    RBC. 3.80 (*)    Retic Count, Absolute 250.8 (*)    All other components within normal limits    EKG  EKG Interpretation None       Radiology No results found.  Procedures Procedures (including critical care  time)  Medications Ordered in ED Medications  0.45 % sodium chloride infusion (not administered)  HYDROmorphone (DILAUDID) injection 0.5 mg (not administered)    Or  HYDROmorphone (DILAUDID) injection 0.5 mg (not administered)  HYDROmorphone (DILAUDID) injection 1 mg (not administered)    Or  HYDROmorphone (DILAUDID) injection 1 mg (not administered)  HYDROmorphone (DILAUDID) injection 1 mg (not administered)    Or  HYDROmorphone (DILAUDID) injection 1 mg (not administered)  diphenhydrAMINE (BENADRYL) capsule 25-50 mg (not administered)     Initial Impression / Assessment and Plan / ED Course  I have reviewed the triage vital signs and the nursing notes.  Pertinent labs & imaging results that were available during my care of the patient were reviewed by me and considered in my medical decision making (see chart for details).     Patient with history of sickle cell disease presents with pain typical of pain related to his disease. No fever. No neurologic red flags to suspect cauda equina. He denies other pain.  He is taking Percocet as only home pain medication. Will give opioid naive  Dilaudid dosing in ED.   Medications given on protocol scheduling. Patient reports no change in pain after 3 doses of Dilaudid. He feels he needs to be admitted for further management.   Discussed with Dyanne Carrel, NP [facility]), who accepts for admission with plan to transfer to [facility].   Final Clinical Impressions(s) / ED Diagnoses   Final diagnoses:  None   1. Sickle cell anemia with pain  ED Discharge Orders    None       Charlann Lange, Hershal Coria 03/28/17 0092    Ezequiel Essex, MD 03/28/17 [phone]

## 2017-03-28 NOTE — ED Triage Notes (Signed)
Pt reports lower back pain onset midnight. Pt states he has sickle cell. Tried OTC meds with no relief. Denies CP, SOB. VSS.

## 2017-03-28 NOTE — ED Notes (Signed)
Portable called for PCA pump

## 2017-03-28 NOTE — ED Notes (Signed)
Patient was given a Regular Diet Order.

## 2017-03-28 NOTE — ED Notes (Signed)
RE-REQUESTED [facility] TO TX PATIENT

## 2017-03-28 NOTE — ED Notes (Signed)
Leads off again. Patient continues to be verbally agressive
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
## 2017-03-28 NOTE — ED Notes (Signed)
Spoke with pt-- explained that [facility] would be notified again to pick pt up, and if pt chooses to not get on stretcher-- pt will be discharged from the ED-- per house coverage. Pt requesting more pain meds-- explained that he has control over his meds with PCA pump. Pt is still requesting meds--

## 2017-03-28 NOTE — ED Notes (Signed)
PT able to verbally redirect patient at this time and assist him in putting back on CO2 monitor and pulse ox. He is verbally threatening Gabe, RN at this time.

## 2017-03-28 NOTE — ED Notes (Signed)
[facility] here to transport the patient to [facility].  Patient refused to get on the stretcher.  [facility] Departed.  Charge nurse notified went into the room to talk with the patient.

## 2017-03-28 NOTE — ED Notes (Signed)
Patient is currently confused and verbally aggressive.

## 2017-03-28 NOTE — ED Notes (Signed)
Patient pulled off his CO2 monitor again.  When he was asked to put it on he became verbally aggressive and demanded that this RN leave the room.

## 2017-03-28 NOTE — ED Notes (Signed)
Patient has a room assignment at [facility].  Patient informed of room assignment and transfer.  Patient receptive.
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
## 2017-03-28 NOTE — ED Notes (Signed)
Gave patient a sprite

## 2017-03-28 NOTE — H&P (Signed)
History and Physical    Timothy Marks GQQ:[id number] DOB: 01-30-1991 DOA: 03/28/2017  PCP: Patient, No Pcp Per [place] [facility] Patient coming from: home  Chief Complaint: low back pain/sickle cell crisis  HPI: Timothy Marks is a 27 y.o. male with medical history significant for sickle cell disease, depression, osteonecrosis, aggressive behavior presents emergency Department chief complaint low back pain. Initial evaluation reveals sickle cell crisis. [facility] are asked to admit  Information is obtained from the patient. He states that 2 days ago he developed low back pain typical of his sickle cell crisis pain. He describes the pain as constant aching like rates it a 10 out of 10. He went to the emergency department he was provided with IV fluids and pain medicine was discharged. He reports the pain improved until last night around midnight his low back legs and arms began to ache again. He denies chest pain palpitation shortness of breath. He denies headache dizziness syncope or near-syncope. He denies abdominal pain nausea vomiting diarrhea constipation melena bright red blood per rectum. Denies cough fever chills recent travel or sick contacts. He reports he has been compliant with his hydroxyurea his Percocet and his folic acid.   ED Course: In the emergency department he is provided with multiple doses of Dilaudid vigorous IV fluids with little improvement in his pain. He is hemodynamically stable with a blood pressure on the low end of normal he is not hypoxic.  Review of Systems: As per HPI otherwise all other systems reviewed and are negative.   Ambulatory Status: In place independently is independent with ADLs  Past Medical History:  Diagnosis Date  . Depression   . 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 Sickle cell anemia (HCC)   . Vitamin D deficiency y-6    Past Surgical History:  Procedure Laterality  Date  . CHOLECYSTECTOMY      Social History   Socioeconomic History  . Marital status: Single    Spouse name: Not on file  . Number of children: Not on file  . Years of education: Not on file  . Highest education level: Not on file  Social Needs  . Financial resource strain: Not on file  . Food insecurity - worry: Not on file  . Food insecurity - inability: Not on file  . Transportation needs - medical: Not on file  . Transportation needs - non-medical: Not on file  Occupational History  . Not on file  Tobacco Use  . Smoking status: Never Smoker  . Smokeless tobacco: Never Used  Substance and Sexual Activity  . Alcohol use: No  . Drug use: Yes    Types: Marijuana  . Sexual activity: Not on file  Other Topics Concern  . Not on file  Social History Narrative  . Not on file    Allergies  Allergen Reactions  . Levaquin [Levofloxacin] Itching  . Morphine And Related Hives  . Toradol [Ketorolac Tromethamine] Itching  . Tramadol Hives  . Vancomycin Itching  . Zosyn [Piperacillin Sod-Tazobactam So] Itching and Rash    Has taken rocephin in past    Family History  Problem Relation Age of Onset  . Sickle cell trait Mother   . Sickle cell trait Father     Prior to Admission medications   Medication Sig Start Date End Date Taking? Authorizing Provider  folic acid (FOLVITE) 1 MG tablet Take 1 mg by mouth daily.   Yes [provider]  hydroxyurea (HYDREA) 500 MG capsule Take 500 mg by mouth 2 (two) times daily. May take with food to minimize GI side effects.   Yes [provider]  aspirin 81 MG chewable tablet Chew 1 tablet (81 mg total) by mouth daily. Patient not taking: Reported on 05/05/2014 04/09/14   Leana Gamer, MD  cholecalciferol (VITAMIN D) 1000 UNITS tablet Take 2 tablets (2,000 Units total) by mouth daily. Patient not taking: Reported on 05/05/2014 04/09/14   Leana Gamer, MD  diclofenac sodium (VOLTAREN) 1 % GEL Apply 2 g  topically 4 (four) times daily. Patient not taking: Reported on 05/05/2014 04/09/14   Leana Gamer, MD  escitalopram (LEXAPRO) 10 MG tablet Take 1 tablet (10 mg total) by mouth daily. Patient not taking: Reported on 05/05/2014 04/09/14   Leana Gamer, MD  feeding supplement, ENSURE COMPLETE, (ENSURE COMPLETE) LIQD Take 237 mLs by mouth 3 (three) times daily between meals. Patient not taking: Reported on 05/05/2014 04/09/14   Leana Gamer, MD  hydrOXYzine (ATARAX/VISTARIL) 50 MG tablet Take 1 tablet (50 mg total) by mouth at bedtime. Patient not taking: Reported on 05/05/2014 04/09/14   Leana Gamer, MD  oxyCODONE-acetaminophen (PERCOCET) 10-325 MG per tablet Take 1 tablet by mouth every 4 (four) hours as needed for pain. Patient not taking: Reported on 03/28/2017 07/07/14   Linton Flemings, MD  polyethylene glycol [facility] Floria Raveling) packet Take 17 g by mouth daily as needed for mild constipation. Patient not taking: Reported on 05/05/2014 04/09/14   Leana Gamer, MD  senna-docusate (SENOKOT-S) 8.6-50 MG per tablet Take 1 tablet by mouth 2 (two) times daily. Patient not taking: Reported on 05/05/2014 04/09/14   Leana Gamer, MD  valACYclovir (VALTREX) 500 MG tablet Take 1 tablet (500 mg total) by mouth 2 (two) times daily. Patient not taking: Reported on 03/28/2017 08/11/14   Elwyn Reach, MD    Physical Exam: Vitals:   03/28/17 0630 03/28/17 0645 03/28/17 0700 03/28/17 0715  BP: 135/84 103/62 107/64 118/79  Pulse: 81 (!) 54 71 75  Resp:    16  Temp:      TempSrc:      SpO2: 100% 100% 100% 100%  Weight:      Height:         General:  Appears mildly to moderately uncomfortable in no acute distress Eyes:  PERRL, EOMI, normal lids, iris ENT:  grossly normal hearing, lips & tongue, mucous membranes of his mouth are pink slightly dry Neck:  no LAD, masses or thyromegaly Cardiovascular:  RRR, no m/r/g. Trace LE edema.  Respiratory:  Normal effort  respirations slightly shallow rest sounds are clear hear no rhonchi no wheezing Abdomen:  soft, ntnd, NABS Skin:  no rash or induration seen on limited exam Musculoskeletal:  Tenderness to low back lateral legs right greater than left joints without swelling/erythema is moving all extremities spontaneously Psychiatric:  grossly normal mood and affect, speech fluent and appropriate, AOx3 Neurologic:  CN 2-12 grossly intact, moves all extremities in coordinated fashion, sensation intact  Labs on Admission: I have personally reviewed following labs and imaging studies  CBC: Recent Labs  Lab 03/25/17 0845 03/28/17 0323  WBC 8.8 9.8  NEUTROABS 5.7 5.1  HGB 12.3* 11.5*  HCT 35.4* 33.5*  MCV 88.5 88.2  PLT 317 409   Basic Metabolic Panel: Recent Labs  Lab 03/25/17 0845 03/28/17 0323  NA 137 139  K 4.1 3.4*  CL 105 102  CO2 20* 25  GLUCOSE 83 95  BUN <5* 5*  CREATININE 0.56* 0.69  CALCIUM 8.7* 8.9   GFR: Estimated Creatinine Clearance: 127.9 mL/min (by C-G formula based on SCr of 0.69 mg/dL). Liver Function Tests: Recent Labs  Lab 03/25/17 0845 03/28/17 0323  AST 46* 29  ALT 16* 13*  ALKPHOS 60 65  BILITOT 2.1* 2.5*  PROT 7.3 7.1  ALBUMIN 3.9 3.8   No results for input(s): LIPASE, AMYLASE in the last 168 hours. No results for input(s): AMMONIA in the last 168 hours. Coagulation Profile: No results for input(s): INR, PROTIME in the last 168 hours. Cardiac Enzymes: No results for input(s): CKTOTAL, CKMB, CKMBINDEX, TROPONINI in the last 168 hours. BNP (last 3 results) No results for input(s): PROBNP in the last 8760 hours. HbA1C: No results for input(s): HGBA1C in the last 72 hours. CBG: No results for input(s): GLUCAP in the last 168 hours. Lipid Profile: No results for input(s): CHOL, HDL, LDLCALC, TRIG, CHOLHDL, LDLDIRECT in the last 72 hours. Thyroid Function Tests: No results for input(s): TSH, T4TOTAL, FREET4, T3FREE, THYROIDAB in the last 72 hours. Anemia  Panel: Recent Labs    03/25/17 0845 03/28/17 0323  RETICCTPCT 4.4* 6.6*   Urine analysis:    Component Value Date/Time   COLORURINE AMBER (A) 07/31/2014 1115   APPEARANCEUR CLEAR 07/31/2014 1115   LABSPEC 1.013 07/31/2014 1115   PHURINE 7.5 07/31/2014 1115   GLUCOSEU NEGATIVE 07/31/2014 1115   HGBUR NEGATIVE 07/31/2014 1115   BILIRUBINUR SMALL (A) 07/31/2014 1115   KETONESUR NEGATIVE 07/31/2014 1115   PROTEINUR NEGATIVE 07/31/2014 1115   UROBILINOGEN >8.0 (H) 07/31/2014 1115   NITRITE NEGATIVE 07/31/2014 1115   LEUKOCYTESUR NEGATIVE 07/31/2014 1115    Creatinine Clearance: Estimated Creatinine Clearance: 127.9 mL/min (by C-G formula based on SCr of 0.69 mg/dL).  Sepsis Labs: @LABRCNTIP (procalcitonin:4,lacticidven:4) )No results found for this or any previous visit (from the past 240 hour(s)).   Radiological Exams on Admission: No results found.  EKG:   Assessment/Plan Principal Problem:   Sickle cell anemia with crisis (HCC) Active Problems:   Hypokalemia   Sickle cell anemia (HCC)   Hyperbilirubinemia   Major depressive disorder, recurrent, severe without psychotic features ([place])   #1. Sickle cell pain crisis. Reports his pain is mostly in his low back and bilateral legs typical of his sickle cell crisis pain no chest pain no shortness breath no cough no leukocytosis no hypoxia. Chart review indicates in the past he used high-dose PCA but has not been admitted to this system in over 2 years. Care everywhere indicates he's been going to [facility] [place]. home medications include Percocet hydroxia. The emergency department he receives Dilaudid the time of admission he continues to have pain and rated 10 out of 10 -Admit to [place] at [facility] to be followed by the sickle cell team -We will hold off on PCA Dilaudid given that it will be started in emergency department -We'll provide when necessary Dilaudid per protocol -Continue hydroxyurea -Folic acid -Provide  Benadryl -Vigorous IV fluids  #2. Sickle cell anemia. Hemoglobin ending down slightly from 3 days ago to 11.5 from 12.3. Retic ct 6.6 trending up from 3 days ago. Platelets stable -Continue folic acid and hydroxyurea - transfer [facility] to be followed by the sickle cell team  #3. depression and anxiety. Stable. Patient with a history of aggressive behavior well as suicidal or homicidal ideation according to chart. He has not been taking his Lexapro. Denies any current suicidal  or homicidal ideation -We'll resume Lexapro -Monitor  #4. Hyperbilirubinemia. Total bilirubin 2.5 chart review indicates this is close to his baseline. Likely related to above    DVT prophylaxis: lovenox  Code Status: full  Family Communication: none present  Disposition Plan: home when ready  Consults called: notifie  Admission status: inpatient    Radene Gunning MD [facility]  If 7PM-7AM, please contact night-coverage [email or web address] Password [facility]  03/28/2017, 7:53 AM

## 2017-03-29 ENCOUNTER — Emergency Department (HOSPITAL_COMMUNITY): Payer: Medicaid Other

## 2017-03-29 ENCOUNTER — Inpatient Hospital Stay (HOSPITAL_COMMUNITY)
Admission: EM | Admit: 2017-03-29 | Discharge: 2017-04-05 | Disposition: A | Discharge status: Home / Self Care | Payer: Medicaid Other | Source: Home / Self Care | Attending: Internal Medicine | Admitting: Internal Medicine

## 2017-03-29 ENCOUNTER — Encounter (HOSPITAL_COMMUNITY): Payer: Self-pay

## 2017-03-29 DIAGNOSIS — F4325 Adjustment disorder with mixed disturbance of emotions and conduct: Secondary | ICD-10-CM

## 2017-03-29 DIAGNOSIS — K5900 Constipation, unspecified: Secondary | ICD-10-CM | POA: Diagnosis present

## 2017-03-29 DIAGNOSIS — D72829 Elevated white blood cell count, unspecified: Secondary | ICD-10-CM | POA: Diagnosis present

## 2017-03-29 DIAGNOSIS — D5700 Hb-SS disease with crisis, unspecified: Secondary | ICD-10-CM | POA: Diagnosis present

## 2017-03-29 DIAGNOSIS — R079 Chest pain, unspecified: Secondary | ICD-10-CM

## 2017-03-29 LAB — COMPREHENSIVE METABOLIC PANEL
ALT: 17 U/L (ref 17–63)
AST: 28 U/L (ref 15–41)
Albumin: 3.8 g/dL (ref 3.5–5.0)
Alkaline Phosphatase: 86 U/L (ref 38–126)
Anion gap: 10 (ref 5–15)
BUN: 10 mg/dL (ref 6–20)
CO2: 24 mmol/L (ref 22–32)
Calcium: 8.6 mg/dL — ABNORMAL LOW (ref 8.9–10.3)
Chloride: 104 mmol/L (ref 101–111)
Creatinine, Ser: 0.58 mg/dL — ABNORMAL LOW (ref 0.61–1.24)
GFR calc Af Amer: >60 mL/min (ref >60)
GFR calc non Af Amer: >60 mL/min (ref >60)
Glucose, Bld: 86 mg/dL (ref 65–99)
Potassium: 3.5 mmol/L (ref 3.5–5.1)
Sodium: 138 mmol/L (ref 135–145)
Total Bilirubin: 2.9 mg/dL — ABNORMAL HIGH (ref 0.3–1.2)
Total Protein: 7.2 g/dL (ref 6.5–8.1)

## 2017-03-29 LAB — CBC WITH DIFFERENTIAL/PLATELET
Basophils Absolute: 0 10*3/uL (ref 0.0–0.1)
Basophils Relative: 0 %
Eosinophils Absolute: 0 10*3/uL (ref 0.0–0.7)
Eosinophils Relative: 0 %
HCT: 30.8 % — ABNORMAL LOW (ref 39.0–52.0)
Hemoglobin: 10.7 g/dL — ABNORMAL LOW (ref 13.0–17.0)
Lymphocytes Relative: 14 %
Lymphs Abs: 1.8 10*3/uL (ref 0.7–4.0)
MCH: 30.6 pg (ref 26.0–34.0)
MCHC: 34.7 g/dL (ref 30.0–36.0)
MCV: 88 fL (ref 78.0–100.0)
Monocytes Absolute: 1.1 10*3/uL — ABNORMAL HIGH (ref 0.1–1.0)
Monocytes Relative: 8 %
Neutro Abs: 9.9 10*3/uL — ABNORMAL HIGH (ref 1.7–7.7)
Neutrophils Relative %: 78 %
Platelets: 347 10*3/uL (ref 150–400)
RBC: 3.5 MIL/uL — ABNORMAL LOW (ref 4.22–5.81)
RDW: 19.2 % — ABNORMAL HIGH (ref 11.5–15.5)
WBC: 12.8 10*3/uL — ABNORMAL HIGH (ref 4.0–10.5)

## 2017-03-29 LAB — RETICULOCYTES
RBC.: 3.5 MIL/uL — ABNORMAL LOW (ref 4.22–5.81)
Retic Count, Absolute: 217 10*3/uL — ABNORMAL HIGH (ref 19.0–186.0)
Retic Ct Pct: 6.2 % — ABNORMAL HIGH (ref 0.4–3.1)

## 2017-03-29 LAB — TROPONIN I: Troponin I: <0.03 ng/mL (ref <0.03)

## 2017-03-29 MED ORDER — HYDROMORPHONE HCL 2 MG/ML IJ SOLN
2.0000 mg | INTRAMUSCULAR | Status: AC
  Administered 2017-03-29: 2 mg via INTRAVENOUS
  Filled 2017-03-29: qty 1

## 2017-03-29 MED ORDER — SODIUM CHLORIDE 0.9 % IV SOLN
25.0000 mg | INTRAVENOUS | Status: DC | PRN
  Filled 2017-03-29: qty 0.5

## 2017-03-29 MED ORDER — ENOXAPARIN SODIUM 40 MG/0.4ML ~~LOC~~ SOLN
40.0000 mg | SUBCUTANEOUS | Status: DC
  Administered 2017-03-30 – 2017-04-03 (×4): 40 mg via SUBCUTANEOUS
  Filled 2017-03-29 (×6): qty 0.4

## 2017-03-29 MED ORDER — DIPHENHYDRAMINE HCL 25 MG PO CAPS: 25.0000 mg | ORAL_CAPSULE | ORAL | Status: DC | PRN

## 2017-03-29 MED ORDER — HYDROMORPHONE HCL 1 MG/ML IJ SOLN
1.0000 mg | Freq: Once | INTRAMUSCULAR | Status: AC | PRN
  Administered 2017-03-29: 1 mg via INTRAVENOUS
  Filled 2017-03-29: qty 1

## 2017-03-29 MED ORDER — HYDROMORPHONE HCL 1 MG/ML IJ SOLN: 1.0000 mg | Freq: Once | INTRAMUSCULAR | Status: DC

## 2017-03-29 MED ORDER — HYDROXYUREA 500 MG PO CAPS
500.0000 mg | ORAL_CAPSULE | Freq: Two times a day (BID) | ORAL | Status: AC
  Administered 2017-03-29 – 2017-04-03 (×9): 500 mg via ORAL
  Filled 2017-03-29 (×10): qty 1

## 2017-03-29 MED ORDER — NALOXONE HCL 0.4 MG/ML IJ SOLN: 0.4000 mg | INTRAMUSCULAR | Status: DC | PRN

## 2017-03-29 MED ORDER — SENNOSIDES-DOCUSATE SODIUM 8.6-50 MG PO TABS
1.0000 | ORAL_TABLET | Freq: Two times a day (BID) | ORAL | Status: DC
  Administered 2017-03-29 – 2017-04-05 (×13): 1 via ORAL
  Filled 2017-03-29 (×13): qty 1

## 2017-03-29 MED ORDER — HYDROMORPHONE HCL 2 MG/ML IJ SOLN: 2.0000 mg | INTRAMUSCULAR | Status: AC

## 2017-03-29 MED ORDER — ONDANSETRON HCL 4 MG/2ML IJ SOLN
4.0000 mg | INTRAMUSCULAR | Status: DC | PRN
Start: 2017-03-29 — End: 2017-03-29

## 2017-03-29 MED ORDER — VITAMIN D3 25 MCG (1000 UNIT) PO TABS
2000.0000 [IU] | ORAL_TABLET | Freq: Every day | ORAL | Status: DC
  Administered 2017-03-30 – 2017-04-05 (×6): 2000 [IU] via ORAL
  Filled 2017-03-29 (×6): qty 2

## 2017-03-29 MED ORDER — DIPHENHYDRAMINE HCL 25 MG PO CAPS
25.0000 mg | ORAL_CAPSULE | ORAL | Status: DC | PRN
  Filled 2017-03-29: qty 1

## 2017-03-29 MED ORDER — ONDANSETRON HCL 4 MG/2ML IJ SOLN
4.0000 mg | Freq: Four times a day (QID) | INTRAMUSCULAR | Status: DC | PRN
Start: 2017-03-29 — End: 2017-03-29

## 2017-03-29 MED ORDER — HYDROMORPHONE 1 MG/ML IV SOLN
INTRAVENOUS | Status: DC
  Administered 2017-03-29: 21:00:00 via INTRAVENOUS
  Filled 2017-03-29: qty 25

## 2017-03-29 MED ORDER — ONDANSETRON HCL 4 MG/2ML IJ SOLN
4.0000 mg | Freq: Four times a day (QID) | INTRAMUSCULAR | Status: DC | PRN
  Administered 2017-03-30 – 2017-04-01 (×3): 4 mg via INTRAVENOUS
  Filled 2017-03-29 (×4): qty 2

## 2017-03-29 MED ORDER — HYDROMORPHONE HCL 2 MG/ML IJ SOLN
2.0000 mg | Freq: Once | INTRAMUSCULAR | Status: AC
  Administered 2017-03-29: 2 mg via INTRAVENOUS
  Filled 2017-03-29: qty 1

## 2017-03-29 MED ORDER — HYDROMORPHONE 1 MG/ML IV SOLN
INTRAVENOUS | Status: DC
  Administered 2017-03-30: 8 mg via INTRAVENOUS

## 2017-03-29 MED ORDER — POLYETHYLENE GLYCOL 3350 17 G PO PACK
17.0000 g | PACK | Freq: Every day | ORAL | Status: DC
  Filled 2017-03-29 (×2): qty 1

## 2017-03-29 MED ORDER — FOLIC ACID 1 MG PO TABS
1.0000 mg | ORAL_TABLET | Freq: Every day | ORAL | Status: DC
  Administered 2017-03-30 – 2017-04-05 (×6): 1 mg via ORAL
  Filled 2017-03-29 (×6): qty 1

## 2017-03-29 MED ORDER — SODIUM CHLORIDE 0.9% FLUSH: 9.0000 mL | INTRAVENOUS | Status: DC | PRN

## 2017-03-29 MED ORDER — SODIUM CHLORIDE 0.9 % IV SOLN
INTRAVENOUS | Status: DC
Start: 2017-03-29 — End: 2017-03-30
  Administered 2017-03-29 (×2): via INTRAVENOUS

## 2017-03-29 MED ORDER — IBUPROFEN 800 MG PO TABS
800.0000 mg | ORAL_TABLET | Freq: Three times a day (TID) | ORAL | Status: DC
  Administered 2017-03-29 – 2017-04-02 (×8): 800 mg via ORAL
  Filled 2017-03-29 (×11): qty 1

## 2017-03-29 MED ORDER — DEXTROSE-NACL 5-0.45 % IV SOLN
INTRAVENOUS | Status: DC
  Administered 2017-03-29: 10 mL/h via INTRAVENOUS
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
## 2017-03-29 NOTE — H&P (Addendum)
History and Physical    Hansford Hirt AST:[id number] DOB: [birth year] DOA: 03/29/2017  PCP: Ricke Hey, MD  Patient coming from: home  I have personally briefly reviewed patient's old medical records in [place]  Chief Complaint: sickle cell pain  HPI: Timothy Marks is a 27 y.o. male with medical history significant of sickle cell anemia, depression, VTE, presenting with sx c/w sickle cell pain crises.   He was recently admitted yesterday for the same, but left AGAINST MEDICAL ADVICE.  He notes that his symptoms started yesterday.  He describes the pain is starting in his back first and now located in his chest as well and spread to his arms and legs.  His typical sickle cell pain in his is in his lower back.  He will occasionally have chest pain as well.  He describes the pain as dull.  Nothing is improved the pain.  Movement makes it worse.  He denies any sick contacts.  He is eating and drinking okay.  He denies any shortness of breath, sore throat, cough, fevers, nausea, vomiting, diarrhea, numbness, tingling, weakness.   ED Course: Labs, IV dilaudid, CXR.  Admit for pain crises.   Review of Systems: As per HPI otherwise 10 point review of systems negative.   Past Medical History:  Diagnosis Date  . Depression   . 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 Sickle cell anemia (HCC)   . Vitamin D deficiency y-6    Past Surgical History:  Procedure Laterality Date  . CHOLECYSTECTOMY       reports that  has never smoked. he has never used smokeless tobacco. He reports that he uses drugs. Drug: Marijuana. He reports that he does not drink alcohol.  Allergies  Allergen Reactions  . Levaquin [Levofloxacin] Itching  . Morphine And Related Hives  . Toradol [Ketorolac Tromethamine] Itching  . Tramadol Hives  . Vancomycin Itching  . Zosyn [Piperacillin Sod-Tazobactam So] Itching and Rash    Has taken rocephin  in past    Family History  Problem Relation Age of Onset  . Sickle cell trait Mother   . Sickle cell trait Father    Prior to Admission medications   Medication Sig Start Date End Date Taking? Authorizing Provider  folic acid (FOLVITE) 1 MG tablet Take 1 mg by mouth daily.   Yes [provider]  hydroxyurea (HYDREA) 500 MG capsule Take 500 mg by mouth 2 (two) times daily. May take with food to minimize GI side effects.   Yes [provider]  cholecalciferol (VITAMIN D) 1000 UNITS tablet Take 2 tablets (2,000 Units total) by mouth daily. Patient not taking: Reported on 05/05/2014 04/09/14   Leana Gamer, MD    Physical Exam: Vitals:   03/29/17 1730 03/29/17 1755 03/29/17 1800 03/29/17 1830  BP: 125/81 125/81 127/86 122/90  Pulse: 70 75 60 (!) 58  Resp: 11 13 14  (!) 23  Temp:      TempSrc:      SpO2: 100% 100% 100% 92%  Weight:      Height:        Constitutional: NAD, calm, comfortable Vitals:   03/29/17 1730 03/29/17 1755 03/29/17 1800 03/29/17 1830  BP: 125/81 125/81 127/86 122/90  Pulse: 70 75 60 (!) 58  Resp: 11 13 14  (!) 23  Temp:      TempSrc:      SpO2: 100% 100% 100% 92%  Weight:  Height:       Eyes: PERRL, lids and conjunctivae normal ENMT: Mucous membranes are moist. Posterior pharynx clear of any exudate or lesions.Normal dentition.  Neck: normal, supple, no masses, no thyromegaly Respiratory: clear to auscultation bilaterally, no wheezing, no crackles. Normal respiratory effort. No accessory muscle use.  Cardiovascular: Regular rate and rhythm, no murmurs / rubs / gallops. No extremity edema. 2+ pedal pulses. No carotid bruits.  Abdomen: no tenderness, no masses palpated. No hepatosplenomegaly. Bowel sounds positive.  Musculoskeletal: TTP paraspinal throughout back and chest as well.  TTP to palpation of arms and legs.  Skin: no rashes, lesions, ulcers. No induration Neurologic: CN 2-12 grossly intact. Sensation intact. Moving all  extremities.  Psychiatric: Normal judgment and insight. Alert and oriented x 3. Normal mood.   Labs on Admission: I have personally reviewed following labs and imaging studies  CBC: Recent Labs  Lab 03/25/17 0845 03/28/17 0323 03/29/17 1345  WBC 8.8 9.8 12.8*  NEUTROABS 5.7 5.1 9.9*  HGB 12.3* 11.5* 10.7*  HCT 35.4* 33.5* 30.8*  MCV 88.5 88.2 88.0  PLT 317 379 073   Basic Metabolic Panel: Recent Labs  Lab 03/25/17 0845 03/28/17 0323 03/29/17 1345  NA 137 139 138  K 4.1 3.4* 3.5  CL 105 102 104  CO2 20* 25 24  GLUCOSE 83 95 86  BUN <5* 5* 10  CREATININE 0.56* 0.69 0.58*  CALCIUM 8.7* 8.9 8.6*   GFR: Estimated Creatinine Clearance: 127.9 mL/min (A) (by C-G formula based on SCr of 0.58 mg/dL (L)). Liver Function Tests: Recent Labs  Lab 03/25/17 0845 03/28/17 0323 03/29/17 1345  AST 46* 29 28  ALT 16* 13* 17  ALKPHOS 60 65 86  BILITOT 2.1* 2.5* 2.9*  PROT 7.3 7.1 7.2  ALBUMIN 3.9 3.8 3.8   No results for input(s): LIPASE, AMYLASE in the last 168 hours. No results for input(s): AMMONIA in the last 168 hours. Coagulation Profile: No results for input(s): INR, PROTIME in the last 168 hours. Cardiac Enzymes: No results for input(s): CKTOTAL, CKMB, CKMBINDEX, TROPONINI in the last 168 hours. BNP (last 3 results) No results for input(s): PROBNP in the last 8760 hours. HbA1C: No results for input(s): HGBA1C in the last 72 hours. CBG: No results for input(s): GLUCAP in the last 168 hours. Lipid Profile: No results for input(s): CHOL, HDL, LDLCALC, TRIG, CHOLHDL, LDLDIRECT in the last 72 hours. Thyroid Function Tests: No results for input(s): TSH, T4TOTAL, FREET4, T3FREE, THYROIDAB in the last 72 hours. Anemia Panel: Recent Labs    03/28/17 0323 03/29/17 1345  RETICCTPCT 6.6* 6.2*   Urine analysis:    Component Value Date/Time   COLORURINE AMBER (A) 07/31/2014 1115   APPEARANCEUR CLEAR 07/31/2014 1115   LABSPEC 1.013 07/31/2014 1115   PHURINE 7.5  07/31/2014 1115   GLUCOSEU NEGATIVE 07/31/2014 1115   HGBUR NEGATIVE 07/31/2014 1115   BILIRUBINUR SMALL (A) 07/31/2014 1115   KETONESUR NEGATIVE 07/31/2014 1115   PROTEINUR NEGATIVE 07/31/2014 1115   UROBILINOGEN >8.0 (H) 07/31/2014 1115   NITRITE NEGATIVE 07/31/2014 1115   LEUKOCYTESUR NEGATIVE 07/31/2014 1115    Radiological Exams on Admission: Dg Chest 2 View  Result Date: 03/29/2017 CLINICAL DATA:  Sickle cell disease with pain EXAM: CHEST  2 VIEW COMPARISON:  January 01, 2017 FINDINGS: Lungs are clear. Heart size and pulmonary vascularity are normal. No adenopathy. 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 Somewhat generalized bony sclerosis is consistent with known sickle cell disease. IMPRESSION: Bony changes indicative of sickle cell  disease. Note that there are multiple thoracic spine endplate infarcts. No edema or consolidation. Heart size normal. No evident adenopathy. Electronically Signed   By: Lowella Grip III M.D.   On: 03/29/2017 14:34    EKG: Independently reviewed. Compared to prior.  NSR.  Normal axis.  Normal intervals.  No ST-T wave changes concerning for ischemia.  LVH.  Appears similar to prior EKG.   Assessment/Plan Active Problems:   Sickle cell pain crisis (HCC)  Sickle Cell Pain Crises  Sickle Cell Anemia:  Noted to have chest and back pain.  Back pain similar to previous episodes.  Occasionally has CP as well with some of his pain crises.  CXR without focal opacity and no O2 requirement.  EKG without concerning findings.  Recent admission yesterday and in early January at [facility].  PMP aware checked, pt has percocet 10-325 at home he notes he takes just as needed (filled 1/22, 120 tablets) Will start dilaudid PCA, opiate tolerant (but lower dose, sounds like he got confused/agitated yesterday)  -> lockout 2 mg per hr and 0.5 mg boluses (s/p 2 mg dilaudid x4 in ED, seemed comfortable at rest, but in pain with movement).   Schedule ibuprofen given  toradol allergy Bowel regimen CBC and retic tomorrow Bili 2.9, retic adequate, H/H slightly downtrending from yesterday Continue hydroxyurea, folate CXR with bony changes indicative of sickle cell dz, multiple thoracic spine endplate infarcts IVF x 24 hrs Had discussion regarding working with and complying with nursing and staff as he recently left AMA just yesterday  Chest Pain: CXR as noted above.  EKG reassuring.  Will trend troponins.  Consider echo at some point with EKG with LVH.   Leukocytosis: no si/sx of infection.  Follow UA.  CXR without infiltrate.  No sick contacts.  Culture if febrile.   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 per outside records, he wasn't able to tell me much history, but noted in outside medical records  Elevated Bili: 2/2 above  Depression: no longer taking lexapro, removed from med list  Vitamin D deficiency: continue vit D  Removed ASA, voltaren, ensure, hydroxyzine, miralax and senna from med list as he noted he was no longer taking these  DVT prophylaxis: lovenox  Code Status: full  Family Communication: declined me calling, noted his family was awared  Disposition Plan: pending pain control Consults called: none  Admission status: inpatient given need for IV pain medication and likely to require greater than 2 midnights for pain control and conversion to PO pain meds   Fayrene Helper MD [facility] Pager [phone]  If 7PM-7AM, please contact night-coverage [email or web address] Password [facility]  03/29/2017, 6:52 PM

## 2017-03-29 NOTE — ED Provider Notes (Signed)
[facility] DEPT Provider Note   CSN: [id number] Arrival date & time: 03/29/17  1308     History   Chief Complaint Chief Complaint  Patient presents with  . Sickle Cell Pain Crisis    HPI Timothy Marks is a 27 y.o. male with a history of sickle cell who presents the emergency department today for sickle cell crisis.  Patient was seen at [facility] emergency department for sickle cell pain crisis reassuring labs.  Patient's pain was not controlled with 3 doses of Dilaudid so consult was made for admission.  Patient left AMA however.  He is presenting today because he has had continued pain into the morning.  He states his pain is primarily in his lower back but is now generalized including his central chest and lower legs.  He says this is typical sites of his sickle cell crisis.  He denies any history of acute chest syndrome.  He is tried ibuprofen and Percocet this morning since leaving AMA without relief.  He takes folic acid and hydroxyurea at home.  Nothing makes the patient's symptoms better or worse.  He denies any fever, chills, numbness/tingling, weakness, saddle anesthesia, bowel/bladder incontinence, cough, congestion, sore throat, shortness of breath, abdominal pain, nausea/vomiting/diarrhea, diaphoresis or any urinary symptoms.  HPI  Past Medical History:  Diagnosis Date  . Depression   . 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 Sickle cell anemia (HCC)   . Vitamin D deficiency y-6    Patient Active Problem List   Diagnosis Date Noted  . Constipation 08/03/2014  . Hb-SS disease with crisis ([place]) 08/01/2014  . Leukocytosis 06/30/2014  . Fever 05/07/2014  . Pain and swelling of left knee 05/07/2014  . Sickle cell anemia with crisis ([place]) 05/06/2014  . 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 Protein-calorie malnutrition, severe ([place]) 04/09/2014  . Major depressive disorder, recurrent, severe  without psychotic features ([place])   . 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 Vitamin D deficiency   . Sickle cell pain crisis ([place]) 03/30/2014  . Sickle cell anemia ([place]) 03/30/2014  . Hyperbilirubinemia 03/30/2014  . Hypokalemia 02/07/2014  . Sickle cell crisis ([place]) 03/03/2013  . Aggressive behavior 01/25/2013    Past Surgical History:  Procedure Laterality Date  . CHOLECYSTECTOMY         Home Medications    Prior to Admission medications   Medication Sig Start Date End Date Taking? Authorizing Provider  folic acid (FOLVITE) 1 MG tablet Take 1 mg by mouth daily.   Yes [provider]  hydroxyurea (HYDREA) 500 MG capsule Take 500 mg by mouth 2 (two) times daily. May take with food to minimize GI side effects.   Yes [provider]  aspirin 81 MG chewable tablet Chew 1 tablet (81 mg total) by mouth daily. Patient not taking: Reported on 05/05/2014 04/09/14   Leana Gamer, MD  cholecalciferol (VITAMIN D) 1000 UNITS tablet Take 2 tablets (2,000 Units total) by mouth daily. Patient not taking: Reported on 05/05/2014 04/09/14   Leana Gamer, MD  diclofenac sodium (VOLTAREN) 1 % GEL Apply 2 g topically 4 (four) times daily. Patient not taking: Reported on 05/05/2014 04/09/14   Leana Gamer, MD  escitalopram (LEXAPRO) 10 MG tablet Take 1 tablet (10 mg total) by mouth daily. Patient not taking: Reported on 05/05/2014 04/09/14   Leana Gamer, MD  feeding supplement, ENSURE COMPLETE, (ENSURE COMPLETE)  LIQD Take 237 mLs by mouth 3 (three) times daily between meals. Patient not taking: Reported on 05/05/2014 04/09/14   Leana Gamer, MD  hydrOXYzine (ATARAX/VISTARIL) 50 MG tablet Take 1 tablet (50 mg total) by mouth at bedtime. Patient not taking: Reported on 05/05/2014 04/09/14   Leana Gamer, MD  polyethylene glycol [facility] Floria Raveling) packet Take 17 g by mouth daily as needed for mild  constipation. Patient not taking: Reported on 05/05/2014 04/09/14   Leana Gamer, MD  senna-docusate (SENOKOT-S) 8.6-50 MG per tablet Take 1 tablet by mouth 2 (two) times daily. Patient not taking: Reported on 05/05/2014 04/09/14   Leana Gamer, MD    Family History Family History  Problem Relation Age of Onset  . Sickle cell trait Mother   . Sickle cell trait Father     Social History Social History   Tobacco Use  . Smoking status: Never Smoker  . Smokeless tobacco: Never Used  Substance Use Topics  . Alcohol use: No  . Drug use: Yes    Types: Marijuana     Allergies   Levaquin [levofloxacin]; Morphine and related; Toradol [ketorolac tromethamine]; Tramadol; Vancomycin; and Zosyn [piperacillin sod-tazobactam so]   Review of Systems Review of Systems  All other systems reviewed and are negative.    Physical Exam Updated Vital Signs BP 128/79 (BP Location: Left Arm)   Pulse 78   Temp 98.9 F (37.2 C) (Oral)   Resp 16   Ht 6\' 2"  (1.88 m)   Wt 65.8 kg (145 lb)   SpO2 98%   BMI 18.62 kg/m   Physical Exam  Constitutional: He appears well-developed and well-nourished. No distress.  Non-toxic appearing  HENT:  Head: Normocephalic and atraumatic.  Right Ear: Tympanic membrane and external ear normal.  Left Ear: Tympanic membrane and external ear normal.  Nose: Nose normal. No mucosal edema.  Mouth/Throat: Uvula is midline, oropharynx is clear and moist and mucous membranes are normal. No tonsillar exudate.  Eyes: Pupils are equal, round, and reactive to light. Right eye exhibits no discharge. Left eye exhibits no discharge. No scleral icterus.  Neck: Trachea normal and normal range of motion. Neck supple. No spinous process tenderness present. No neck rigidity. Normal range of motion present.  Cardiovascular: Normal rate, regular rhythm, normal heart sounds and intact distal pulses.  No murmur heard. Pulses:      Radial pulses are 2+ on the right  side, and 2+ on the left side.       Femoral pulses are 2+ on the right side, and 2+ on the left side.      Dorsalis pedis pulses are 2+ on the right side, and 2+ on the left side.       Posterior tibial pulses are 2+ on the right side, and 2+ on the left side.  No lower extremity swelling or edema. Calves symmetric in size bilaterally.  Pulmonary/Chest: Effort normal and breath sounds normal. No respiratory distress. He exhibits tenderness (central chest).  Abdominal: Soft. Bowel sounds are normal. He exhibits no pulsatile midline mass. There is no tenderness. There is no rigidity, no rebound, no guarding and no CVA tenderness.  Musculoskeletal: He exhibits no edema.       Right hip: He exhibits normal range of motion.       Left hip: He exhibits normal range of motion.       Right knee: He exhibits normal range of motion.       Left knee:  He exhibits normal range of motion.       Right ankle: He exhibits normal range of motion.       Left ankle: He exhibits normal range of motion.  Posterior and appearance appears normal. No evidence of obvious scoliosis or kyphosis. No obvious signs of skin changes, trauma, deformity, infection. No C spine tenderness or step-offs to palpation. No C paraspinal tenderness.  Diffuse thoracic and lumbar tenderness palpation.  No step-offs noted.  Lung expansion normal. Bilateral lower extremity strength 5 out of 5. Patellar and  Sensation of lower extremities grossly intact. Lower extremity compartments soft. PT and DP 2+ b/l. Cap refill <2 seconds.   Lymphadenopathy:    He has no cervical adenopathy.  Neurological: He is alert.  Skin: Skin is warm, dry and intact. Capillary refill takes less than 2 seconds. No petechiae, no purpura and no rash noted. He is not diaphoretic. No erythema.  Psychiatric: He has a normal mood and affect.  Nursing note and vitals reviewed.    ED Treatments / Results  Labs (all labs ordered are listed, but only abnormal results are  displayed) Labs Reviewed  CBC WITH DIFFERENTIAL/PLATELET - Abnormal; Notable for the following components:      Result Value   WBC 12.8 (*)    RBC 3.50 (*)    Hemoglobin 10.7 (*)    HCT 30.8 (*)    RDW 19.2 (*)    Neutro Abs 9.9 (*)    Monocytes Absolute 1.1 (*)    All other components within normal limits  RETICULOCYTES - Abnormal; Notable for the following components:   Retic Ct Pct 6.2 (*)    RBC. 3.50 (*)    Retic Count, Absolute 217.0 (*)    All other components within normal limits  COMPREHENSIVE METABOLIC PANEL - Abnormal; Notable for the following components:   Creatinine, Ser 0.58 (*)    Calcium 8.6 (*)    Total Bilirubin 2.9 (*)    All other components within normal limits    EKG  EKG Interpretation None       Radiology Dg Chest 2 View  Result Date: 03/29/2017 CLINICAL DATA:  Sickle cell disease with pain EXAM: CHEST  2 VIEW COMPARISON:  January 01, 2017 FINDINGS: Lungs are clear. Heart size and pulmonary vascularity are normal. No adenopathy. 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 Somewhat generalized bony sclerosis is consistent with known sickle cell disease. IMPRESSION: Bony changes indicative of sickle cell disease. Note that there are multiple thoracic spine endplate infarcts. No edema or consolidation. Heart size normal. No evident adenopathy. Electronically Signed   By: Lowella Grip III M.D.   On: 03/29/2017 14:34    Procedures Procedures (including critical care time)  Medications Ordered in ED Medications  dextrose 5 %-0.45 % sodium chloride infusion (10 mL/hr Intravenous New Bag/Given 03/29/17 1403)  diphenhydrAMINE (BENADRYL) capsule 25-50 mg (not administered)  ondansetron (ZOFRAN) injection 4 mg (not administered)  HYDROmorphone (DILAUDID) injection 2 mg (2 mg Intravenous Given 03/29/17 1404)    Or  HYDROmorphone (DILAUDID) injection 2 mg ( Subcutaneous See Alternative 03/29/17 1404)  HYDROmorphone (DILAUDID) injection 2 mg (2 mg  Intravenous Given 03/29/17 1442)    Or  HYDROmorphone (DILAUDID) injection 2 mg ( Subcutaneous See Alternative 03/29/17 1442)  HYDROmorphone (DILAUDID) injection 2 mg (2 mg Intravenous Given 03/29/17 1518)    Or  HYDROmorphone (DILAUDID) injection 2 mg ( Subcutaneous See Alternative 03/29/17 1518)     Initial Impression / Assessment and Plan /  ED Course  I have reviewed the triage vital signs and the nursing notes.  Pertinent labs & imaging results that were available during my care of the patient were reviewed by me and considered in my medical decision making (see chart for details).     27 y.o. male with a history of SCC presenting to the ED with pain consistent with typical sickle cell crisis.  Pain is generalized but greatest in low back and chest.  No bowel or bladder incontinence.  Normal neurologic exam.  Do not suspect cauda equina syndrome.  No fever, cough or shortness of breath.  EKG and chest x-ray reassuring.  Do not suspect acute chest syndrome.  Patient is without shortness of breath and is satting 100% on room air.  Do not suspect PE at this time.  X-ray does show bony changes indicative of sickle cell disease with multiple thoracic spine endplate infarcts.  Lab work consistent with sickle cell disease and reassuring.  On arrival patient was started on sickle cell crisis protocol.  He received 3 rounds of 2 mg of Dilaudid without improvement.  He is requesting admission for further pain control..  Attempted to contact sickle cell clinic but was unable to reach them.  Will consult hospitalist for admission.  Dr. Florene Glen to admit the patient.   Final Clinical Impressions(s) / ED Diagnoses   Final diagnoses:  Sickle cell pain crisis [facility])    ED Discharge Orders    None       Jillyn Ledger, PA-C 03/29/17 1843    Jillyn Ledger, PA-C 03/29/17 1844    Nat Christen, MD 03/30/17 1815

## 2017-03-29 NOTE — ED Notes (Addendum)
No purple man entered. First attempt to call report made, no answer.

## 2017-03-29 NOTE — ED Notes (Signed)
ED TO INPATIENT HANDOFF REPORT  Name/Age/Gender Timothy Marks 27 y.o. male  Code Status Code Status History    Date Active Date Inactive Code Status Order ID Comments User Context   03/28/2017 07:28 03/29/2017 08:55 Full Code [id number]  Radene Gunning, NP ED   08/01/2014 03:14 08/11/2014 21:18 Full Code [id number]  Ivor Costa, MD ED   07/01/2014 01:04 07/05/2014 18:53 Full Code [id number]  Ivor Costa, MD Inpatient   05/06/2014 05:48 05/11/2014 19:50 Full Code [id number]  Lavina Hamman, MD Inpatient   03/30/2014 12:02 04/09/2014 19:29 Full Code [id number]  Bonnielee Haff, MD Inpatient   02/07/2014 15:03 02/12/2014 15:46 Full Code [id number]  Francesca Oman, DO Inpatient   03/03/2013 16:19 03/06/2013 17:30 Full Code [id number]  Olam Idler, MD Inpatient   01/24/2013 16:14 01/25/2013 12:22 Full Code [id number]  Hosie Poisson, MD Inpatient      Home/SNF/Other Home  Chief Complaint Sickle Cell Crisis  Level of Care/Admitting Diagnosis ED Disposition    ED Disposition Condition [place]: [facility] [[id number]]  Level of Care: Med-Surg [16]  Diagnosis: Sickle cell pain crisis [facility]) [[id number]]  Admitting Physician: Elodia Florence [phone]  Attending Physician: Cephus Slater, A [place] [phone]  Estimated length of stay: past midnight tomorrow  Certification:: I certify this patient will need inpatient services for at least 2 midnights  PT Class (Do Not Modify): Inpatient [101]  PT Acc Code (Do Not Modify): Private [1]       Medical History Past Medical History:  Diagnosis Date  . Depression   . 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 Sickle cell anemia (HCC)   . Vitamin D deficiency y-6    Allergies Allergies  Allergen Reactions  . Levaquin [Levofloxacin] Itching  . Morphine And Related Hives  . Toradol [Ketorolac Tromethamine] Itching  . Tramadol Hives  . Vancomycin Itching   . Zosyn [Piperacillin Sod-Tazobactam So] Itching and Rash    Has taken rocephin in past    IV Location/Drains/Wounds Patient Lines/Drains/Airways Status   Active Line/Drains/Airways    Name:   Placement date:   Placement time:   Site:   Days:   Peripheral IV 03/29/17 Right;Posterior Forearm   03/29/17    1349    Forearm   less than 1          Labs/Imaging Results for orders placed or performed during the hospital encounter of 03/29/17 (from the past 48 hour(s))  CBC WITH DIFFERENTIAL     Status: Abnormal   Collection Time: 03/29/17  1:45 PM  Result Value Ref Range   WBC 12.8 (H) 4.0 - 10.5 K/uL   RBC 3.50 (L) 4.22 - 5.81 MIL/uL   Hemoglobin 10.7 (L) 13.0 - 17.0 g/dL   HCT 30.8 (L) 39.0 - 52.0 %   MCV 88.0 78.0 - 100.0 fL   MCH 30.6 26.0 - 34.0 pg   MCHC 34.7 30.0 - 36.0 g/dL   RDW 19.2 (H) 11.5 - 15.5 %   Platelets 347 150 - 400 K/uL   Neutrophils Relative % 78 %   Neutro Abs 9.9 (H) 1.7 - 7.7 K/uL   Lymphocytes Relative 14 %   Lymphs Abs 1.8 0.7 - 4.0 K/uL   Monocytes Relative 8 %   Monocytes Absolute 1.1 (H) 0.1 - 1.0 K/uL   Eosinophils Relative 0 %   Eosinophils Absolute 0.0 0.0 - 0.7 K/uL  Basophils Relative 0 %   Basophils Absolute 0.0 0.0 - 0.1 K/uL    Comment: Performed at [facility], [address] [place], [place] [address]  Reticulocytes     Status: Abnormal   Collection Time: 03/29/17  1:45 PM  Result Value Ref Range   Retic Ct Pct 6.2 (H) 0.4 - 3.1 %   RBC. 3.50 (L) 4.22 - 5.81 MIL/uL   Retic Count, Absolute 217.0 (H) 19.0 - 186.0 K/uL    Comment: Performed at [facility], [address] [place], [place] [address]  Comprehensive metabolic panel     Status: Abnormal   Collection Time: 03/29/17  1:45 PM  Result Value Ref Range   Sodium 138 135 - 145 mmol/L   Potassium 3.5 3.5 - 5.1 mmol/L   Chloride 104 101 - 111 mmol/L   CO2 24 22 - 32 mmol/L   Glucose, Bld 86 65 - 99 mg/dL   BUN 10 6 - 20 mg/dL    Creatinine, Ser 0.58 (L) 0.61 - 1.24 mg/dL   Calcium 8.6 (L) 8.9 - 10.3 mg/dL   Total Protein 7.2 6.5 - 8.1 g/dL   Albumin 3.8 3.5 - 5.0 g/dL   AST 28 15 - 41 U/L   ALT 17 17 - 63 U/L   Alkaline Phosphatase 86 38 - 126 U/L   Total Bilirubin 2.9 (H) 0.3 - 1.2 mg/dL   GFR calc non Af Amer >60 >60 mL/min   GFR calc Af Amer >60 >60 mL/min    Comment: (NOTE) The eGFR has been calculated using the CKD EPI equation. This calculation has not been validated in all clinical situations. eGFR's persistently <60 mL/min signify possible Chronic Kidney Disease.    Anion gap 10 5 - 15    Comment: Performed at [facility], [address] [place], [place] [address]   Dg Chest 2 View  Result Date: 03/29/2017 CLINICAL DATA:  Sickle cell disease with pain EXAM: CHEST  2 VIEW COMPARISON:  January 01, 2017 FINDINGS: Lungs are clear. Heart size and pulmonary vascularity are normal. No adenopathy. 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 Somewhat generalized bony sclerosis is consistent with known sickle cell disease. IMPRESSION: Bony changes indicative of sickle cell disease. Note that there are multiple thoracic spine endplate infarcts. No edema or consolidation. Heart size normal. No evident adenopathy. Electronically Signed   By: William  Woodruff III M.D.   On: 03/29/2017 14:34    Pending Labs Unresulted Labs (From admission, onward)   Start     Ordered   03/29/17 1915  Urinalysis, Routine w reflex microscopic  Once,   R     03/29/17 1914   03/29/17 1858  Troponin I (q 6hr x 3)  Now then every 6 hours,   R     03/29/17 1857   Signed and Held  CBC  (enoxaparin (LOVENOX)    CrCl >/= 30 ml/min)  Once,   R    Comments:  Baseline for enoxaparin therapy IF NOT ALREADY DRAWN.  Notify MD if PLT < 100 K.    Signed and Held   Signed and Held  Creatinine, serum  (enoxaparin (LOVENOX)    CrCl >/= 30 ml/min)  Once,   R    Comments:  Baseline for enoxaparin therapy IF NOT ALREADY  DRAWN.    Signed and Held   Signed and Held  Creatinine, serum  (enoxaparin (LOVENOX)    CrCl >/= 30 ml/min)  Weekly,     R    Comments:  while on enoxaparin therapy    Signed and Held   Signed and Held  CBC with Differential/Platelet  Tomorrow morning,   R     Signed and Held   Signed and Held  Reticulocytes  Tomorrow morning,   R     Signed and Held   Signed and Held  Comprehensive metabolic panel  Tomorrow morning,   R     Signed and Held      Vitals/Pain Today's Vitals   03/29/17 1830 03/29/17 1900 03/29/17 1911 03/29/17 2014  BP: 122/90 126/83 126/83 126/76  Pulse: (!) 58 62 69 85  Resp: (!) 23 15 19 (!) 31  Temp:      TempSrc:      SpO2: 92% 98% 99% 99%  Weight:      Height:      PainSc:        Isolation Precautions No active isolations  Medications Medications  dextrose 5 %-0.45 % sodium chloride infusion (10 mL/hr Intravenous New Bag/Given 03/29/17 1403)  diphenhydrAMINE (BENADRYL) capsule 25-50 mg (not administered)  ondansetron (ZOFRAN) injection 4 mg (not administered)  HYDROmorphone (DILAUDID) injection 2 mg (2 mg Intravenous Given 03/29/17 1404)    Or  HYDROmorphone (DILAUDID) injection 2 mg ( Subcutaneous See Alternative 03/29/17 1404)  HYDROmorphone (DILAUDID) injection 2 mg (2 mg Intravenous Given 03/29/17 1442)    Or  HYDROmorphone (DILAUDID) injection 2 mg ( Subcutaneous See Alternative 03/29/17 1442)  HYDROmorphone (DILAUDID) injection 2 mg (2 mg Intravenous Given 03/29/17 1518)    Or  HYDROmorphone (DILAUDID) injection 2 mg ( Subcutaneous See Alternative 03/29/17 1518)  HYDROmorphone (DILAUDID) injection 2 mg (2 mg Intravenous Given 03/29/17 1812)    Mobility walks  

## 2017-03-29 NOTE — ED Triage Notes (Signed)
Per EMS - Pt c/o sickle cell pain; full body pain chest and back pain x 3hrs. Pt states was in ED last night and left AMA.

## 2017-03-29 NOTE — ED Notes (Signed)
Bed: WA16 Expected date:  Expected time:  Means of arrival:  Comments: EMS 

## 2017-03-29 NOTE — Progress Notes (Signed)
Patient exhibited the same behaviors that he displayed earlier.  He threatened to leave but refused to have his IV removed.  Patient walked out of the room to the hall while verbalizing the need to go back to [facility] with the IV access and the pump.  AC notified and Security called.  Patient was observed pulling out the IV access.  The nurse, the Probation officer, took out the Dilaudid syringe and verified with Sophia, RN the  Remaining 12 ml for waste. Patient left the unit AMA at 0410 escorted by Security agents for exit via a cab to [facility] parking lot.  Patient stated that he left his car there and needed to drive it back to his home.

## 2017-03-30 ENCOUNTER — Other Ambulatory Visit: Payer: Self-pay

## 2017-03-30 DIAGNOSIS — D72829 Elevated white blood cell count, unspecified: Secondary | ICD-10-CM

## 2017-03-30 LAB — COMPREHENSIVE METABOLIC PANEL
ALT: 14 U/L — ABNORMAL LOW (ref 17–63)
AST: 27 U/L (ref 15–41)
Albumin: 3.6 g/dL (ref 3.5–5.0)
Alkaline Phosphatase: 88 U/L (ref 38–126)
Anion gap: 10 (ref 5–15)
BUN: 7 mg/dL (ref 6–20)
CO2: 26 mmol/L (ref 22–32)
Calcium: 8.7 mg/dL — ABNORMAL LOW (ref 8.9–10.3)
Chloride: 104 mmol/L (ref 101–111)
Creatinine, Ser: 0.49 mg/dL — ABNORMAL LOW (ref 0.61–1.24)
GFR calc Af Amer: >60 mL/min (ref >60)
GFR calc non Af Amer: >60 mL/min (ref >60)
Glucose, Bld: 91 mg/dL (ref 65–99)
Potassium: 3.5 mmol/L (ref 3.5–5.1)
Sodium: 140 mmol/L (ref 135–145)
Total Bilirubin: 2.5 mg/dL — ABNORMAL HIGH (ref 0.3–1.2)
Total Protein: 7.1 g/dL (ref 6.5–8.1)

## 2017-03-30 LAB — CBC WITH DIFFERENTIAL/PLATELET
Basophils Absolute: 0 10*3/uL (ref 0.0–0.1)
Basophils Relative: 0 %
Eosinophils Absolute: 0.1 10*3/uL (ref 0.0–0.7)
Eosinophils Relative: 1 %
HCT: 29 % — ABNORMAL LOW (ref 39.0–52.0)
Hemoglobin: 10.1 g/dL — ABNORMAL LOW (ref 13.0–17.0)
Lymphocytes Relative: 20 %
Lymphs Abs: 2.6 10*3/uL (ref 0.7–4.0)
MCH: 30.4 pg (ref 26.0–34.0)
MCHC: 34.8 g/dL (ref 30.0–36.0)
MCV: 87.3 fL (ref 78.0–100.0)
Monocytes Absolute: 1.1 10*3/uL — ABNORMAL HIGH (ref 0.1–1.0)
Monocytes Relative: 8 %
Neutro Abs: 9 10*3/uL — ABNORMAL HIGH (ref 1.7–7.7)
Neutrophils Relative %: 71 %
Platelets: 316 10*3/uL (ref 150–400)
RBC: 3.32 MIL/uL — ABNORMAL LOW (ref 4.22–5.81)
RDW: 18.7 % — ABNORMAL HIGH (ref 11.5–15.5)
WBC: 12.8 10*3/uL — ABNORMAL HIGH (ref 4.0–10.5)

## 2017-03-30 LAB — TROPONIN I
Troponin I: <0.03 ng/mL (ref <0.03)
Troponin I: <0.03 ng/mL (ref <0.03)

## 2017-03-30 LAB — RETICULOCYTES
RBC.: 3.32 MIL/uL — ABNORMAL LOW (ref 4.22–5.81)
Retic Count, Absolute: 176 10*3/uL (ref 19.0–186.0)
Retic Ct Pct: 5.3 % — ABNORMAL HIGH (ref 0.4–3.1)

## 2017-03-30 MED ORDER — HYDROMORPHONE 1 MG/ML IV SOLN
INTRAVENOUS | Status: DC
  Administered 2017-03-30: 11:00:00 via INTRAVENOUS
  Filled 2017-03-30: qty 25

## 2017-03-30 MED ORDER — OXYCODONE HCL 5 MG PO TABS
10.0000 mg | ORAL_TABLET | ORAL | Status: DC
  Administered 2017-03-30 – 2017-04-04 (×28): 10 mg via ORAL
  Filled 2017-03-30 (×30): qty 2

## 2017-03-30 MED ORDER — HYDROMORPHONE 1 MG/ML IV SOLN
INTRAVENOUS | Status: DC
  Administered 2017-03-30: 9.15 mg via INTRAVENOUS

## 2017-03-30 MED ORDER — HYDROMORPHONE 1 MG/ML IV SOLN: INTRAVENOUS | Status: DC

## 2017-03-30 MED ORDER — DEXTROSE-NACL 5-0.45 % IV SOLN
INTRAVENOUS | Status: DC
  Administered 2017-03-30 – 2017-04-03 (×7): via INTRAVENOUS

## 2017-03-30 MED ORDER — HYDROMORPHONE 1 MG/ML IV SOLN
INTRAVENOUS | Status: DC
  Administered 2017-03-30: 4.5 mg via INTRAVENOUS
  Administered 2017-03-30: 10.7 mg via INTRAVENOUS
  Administered 2017-03-31: 8.5 mg via INTRAVENOUS
  Administered 2017-03-31: 4.5 mg via INTRAVENOUS
  Administered 2017-03-31: 3 mg via INTRAVENOUS
  Administered 2017-03-31 (×2): via INTRAVENOUS
  Administered 2017-03-31: 8.5 mg via INTRAVENOUS
  Administered 2017-03-31: 5.5 mg via INTRAVENOUS
  Administered 2017-03-31: 6 mg via INTRAVENOUS
  Administered 2017-03-31: 3.5 mg via INTRAVENOUS
  Administered 2017-04-01: 1 mg via INTRAVENOUS
  Administered 2017-04-01: 20:00:00 via INTRAVENOUS
  Administered 2017-04-01: 3 mg via INTRAVENOUS
  Administered 2017-04-01: 5.9 mg via INTRAVENOUS
  Administered 2017-04-01: 9 mg via INTRAVENOUS
  Administered 2017-04-01: 4.5 mg via INTRAVENOUS
  Administered 2017-04-02: 5 mg via INTRAVENOUS
  Administered 2017-04-02: 2.5 mg via INTRAVENOUS
  Administered 2017-04-02: 2 mg via INTRAVENOUS
  Administered 2017-04-02: 4.5 mg via INTRAVENOUS
  Administered 2017-04-02: 1.5 mg via INTRAVENOUS
  Administered 2017-04-02: 2 mg via INTRAVENOUS
  Administered 2017-04-03 (×2): 1 mg via INTRAVENOUS
  Administered 2017-04-03: 12:00:00 via INTRAVENOUS
  Administered 2017-04-03: 5 mg via INTRAVENOUS
  Administered 2017-04-03: 7.5 mg via INTRAVENOUS
  Administered 2017-04-03: 1.5 mg via INTRAVENOUS
  Administered 2017-04-04: 7.5 mg via INTRAVENOUS
  Administered 2017-04-04: 3.5 mg via INTRAVENOUS
  Administered 2017-04-04: 06:00:00 via INTRAVENOUS
  Administered 2017-04-04: 8.5 mg via INTRAVENOUS
  Administered 2017-04-04: 8 mg via INTRAVENOUS
  Administered 2017-04-04: 7 mg via INTRAVENOUS
  Filled 2017-03-30 (×5): qty 25

## 2017-03-30 NOTE — Progress Notes (Signed)
Pt hallucinating and asking for a camera so that he can take a picture of the baby. He also called the Network engineer and asked for a big mac and a order of french fries. Pt is able to answer questions appropriately when asked. He is arousable and know who and where he is.

## 2017-03-30 NOTE — Progress Notes (Signed)
SICKLE CELL SERVICE PROGRESS NOTE  Timothy Marks ELF:[id number] DOB: 02/23/1990 DOA: 03/29/2017 PCP: Ricke Hey, MD  Assessment/Plan: Principal Problem:   Sickle cell pain crisis ([place]) Active Problems:   Leukocytosis   Hb-SS disease with crisis ([place])   Chest pain  1. Hb SS with Crisis: Started patient on PCA with a basal dose earlier today given his reported tolerance to opiates and that he was not receiving nay relief from the previous dose. However he began having visual hallucinations and the basal dose was discontinued. He is allergic to Toradol but is able to take tolerate Ibuprofen. Continue Ibuprofen and IVF.  2. Protein Calorie malnutrition: Will order Ensure and consult Nutrition services when patient in less pain.  3. Constipation: Senna-s and Miralax ordered.  4. Anemia of Chronic Disease: Hb higher than baseline of 7 g/dL. Reflects hemo-concentrated state. Continue IVF.  5. Mild Leukocytosis: No evidence of infection. Leukocytosis is a feature of sickle cell crisis.  6. Hypokalemia: resolved.    Code Status: Full Code Family Communication: N/A Disposition Plan: Not yet ready for discharge   [place].  Pager [phone]. If 7PM-7AM, please contact night-coverage.  03/30/2017, 10:02 AM  LOS: 1 day   Interim History: Pt admitted last night for sickle cell crisis. He reports pain 9/10 and localized to low back, sternum, ribs, legs and arms. Pt states that he usually takes 6 pills/day. He reports that his  Baseline pain is 5-6/10 and Percocet can usually manage up 7/10. Once pain gets above 7/10 then he needs to present to the hospital. Since admission (13 hours) he has used 20.25 mg of Dilaudid with 41/25:demnads/deliveries.    Consultants:  None  Procedures:  None  Antibiotics:  None   Objective: Vitals:   03/30/17 0100 03/30/17 0214 03/30/17 0450 03/30/17 0800  BP: 100/60  116/76   Pulse: 88  93   Resp: 12 10 12 10   Temp: 98.2 F (36.8 C)   98 F (36.7 C)   TempSrc: Oral  Axillary   SpO2: 99% 100% 99% 100%  Weight:      Height:       Weight change:   Intake/Output Summary (Last 24 hours) at 03/30/2017 1002 Last data filed at 03/30/2017 0300 Gross per 24 hour  Intake -  Output 1500 ml  Net -1500 ml      Physical Exam General: Alert, awake, oriented x 3, in no apparent distress. He is emaciated in appearance and chronically ill appearing.  HEENT: [place]/AT PEERL, EOMI, anicteric Neck: Trachea midline,  no masses, no thyromegal,y no JVD, no carotid bruit OROPHARYNX:  Moist, No exudate/ erythema/lesions. Pt has dental caries but no signs of infection.  Heart: Regular rate and rhythm, without murmurs, rubs, gallops, PMI non-displaced, no heaves or thrills on palpation.  Lungs: Clear to auscultation, no wheezing or rhonchi noted. No increased vocal fremitus resonant to percussion  Abdomen: Soft, nontender, nondistended, positive bowel sounds, no masses no hepatosplenomegaly noted.  Neuro: No focal neurological deficits noted cranial nerves II through XII grossly intact. . Strength at baseline in bilateral upper and lower extremities. Musculoskeletal: No warmth swelling or erythema around joints, no spinal tenderness noted. Psychiatric: Patient alert and oriented x3, good insight and cognition, good recent to remote recall.    Data Reviewed: Basic Metabolic Panel: Recent Labs  Lab 03/25/17 0845 03/28/17 0323 03/29/17 1345 03/30/17 0110  NA 137 139 138 140  K 4.1 3.4* 3.5 3.5  CL 105 102 104 104  CO2 20* 25  24 26  GLUCOSE 83 95 86 91  BUN <5* 5* 10 7  CREATININE 0.56* 0.69 0.58* 0.49*  CALCIUM 8.7* 8.9 8.6* 8.7*   Liver Function Tests: Recent Labs  Lab 03/25/17 0845 03/28/17 0323 03/29/17 1345 03/30/17 0110  AST 46* 29 28 27   ALT 16* 13* 17 14*  ALKPHOS 60 65 86 88  BILITOT 2.1* 2.5* 2.9* 2.5*  PROT 7.3 7.1 7.2 7.1  ALBUMIN 3.9 3.8 3.8 3.6   No results for input(s): LIPASE, AMYLASE in the last 168  hours. No results for input(s): AMMONIA in the last 168 hours. CBC: Recent Labs  Lab 03/25/17 0845 03/28/17 0323 03/29/17 1345 03/30/17 0110  WBC 8.8 9.8 12.8* 12.8*  NEUTROABS 5.7 5.1 9.9* 9.0*  HGB 12.3* 11.5* 10.7* 10.1*  HCT 35.4* 33.5* 30.8* 29.0*  MCV 88.5 88.2 88.0 87.3  PLT 317 379 347 316   Cardiac Enzymes: Recent Labs  Lab 03/29/17 1945 03/30/17 0110 03/30/17 0719  TROPONINI <0.03 <0.03 <0.03   BNP (last 3 results) No results for input(s): BNP in the last 8760 hours.  ProBNP (last 3 results) No results for input(s): PROBNP in the last 8760 hours.  CBG: No results for input(s): GLUCAP in the last 168 hours.  No results found for this or any previous visit (from the past 240 hour(s)).   Studies: Dg Chest 2 View  Result Date: 03/29/2017 CLINICAL DATA:  Sickle cell disease with pain EXAM: CHEST  2 VIEW COMPARISON:  January 01, 2017 FINDINGS: Lungs are clear. Heart size and pulmonary vascularity are normal. No adenopathy. 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 Somewhat generalized bony sclerosis is consistent with known sickle cell disease. IMPRESSION: Bony changes indicative of sickle cell disease. Note that there are multiple thoracic spine endplate infarcts. No edema or consolidation. Heart size normal. No evident adenopathy. Electronically Signed   By: Lowella Grip III M.D.   On: 03/29/2017 14:34    Scheduled Meds: . cholecalciferol  2,000 Units Oral Daily  . enoxaparin (LOVENOX) injection  40 mg Subcutaneous Q24H  . folic acid  1 mg Oral Daily  . HYDROmorphone   Intravenous Q4H  . hydroxyurea  500 mg Oral BID  . ibuprofen  800 mg Oral TID  . polyethylene glycol  17 g Oral Daily  . senna-docusate  1 tablet Oral BID   Continuous Infusions: . dextrose 5 % and 0.45% NaCl 100 mL/hr at 03/30/17 0931  . diphenhydrAMINE      Principal Problem:   Sickle cell pain crisis ([place]) Active Problems:   Leukocytosis   Hb-SS disease with crisis  ([place])   Chest pain    In excess of 35 minutes spent during this visit. Greater than 50% involved face to face contact with the patient for assessment, counseling and coordination of care.

## 2017-03-31 MED ORDER — LIP MEDEX EX OINT
TOPICAL_OINTMENT | CUTANEOUS | Status: AC
  Administered 2017-03-31: 08:00:00
  Filled 2017-03-31: qty 7

## 2017-03-31 NOTE — Progress Notes (Signed)
This RN has locked pump four times and come into the room and pump is unlocked. Patient educated not to manipulate pump. Will continue to monitor.

## 2017-03-31 NOTE — Progress Notes (Signed)
Subjective: Patient is a 27 year old gentleman admitted with sickle cell crisis. He is on Dilaudid PCA with Toradol and IV fluids.  Patient has stable hemoglobin. He left the hospital [place] prior to today.  His complaint today is pain in the left and right chest wall.   Objective: Vital signs in last 24 hours: Temp:  [97.7 F (36.5 C)-98.4 F (36.9 C)] 98.3 F (36.8 C) (02/15 0400) Pulse Rate:  [61-88] 87 (02/15 0400) Resp:  [9-16] 14 (02/15 0751) BP: (109-144)/(66-79) 144/69 (02/15 0400) SpO2:  [93 %-100 %] 100 % (02/15 0751) Weight change:     Intake/Output from previous day: 02/14 0701 - 02/15 0700 In: 240 [P.O.:240] Out: 600 [Urine:600] Intake/Output this shift: No intake/output data recorded.  General appearance: alert, cooperative, appears stated age and no distress Neck: no adenopathy, no carotid bruit, no JVD, supple, symmetrical, trachea midline and thyroid not enlarged, symmetric, no tenderness/mass/nodules Back: symmetric, no curvature. ROM normal. No CVA tenderness. Resp: clear to auscultation bilaterally Cardio: regular rate and rhythm, S1, S2 normal, no murmur, click, rub or gallop GI: soft, non-tender; bowel sounds normal; no masses,  no organomegaly Extremities: extremities normal, atraumatic, no cyanosis or edema Pulses: 2+ and symmetric Skin: Skin color, texture, turgor normal. No rashes or lesions Neurologic: Grossly normal  Lab Results: Recent Labs    03/29/17 1345 03/30/17 0110  WBC 12.8* 12.8*  HGB 10.7* 10.1*  HCT 30.8* 29.0*  PLT 347 316   BMET Recent Labs    03/29/17 1345 03/30/17 0110  NA 138 140  K 3.5 3.5  CL 104 104  CO2 24 26  GLUCOSE 86 91  BUN 10 7  CREATININE 0.58* 0.49*  CALCIUM 8.6* 8.7*    Studies/Results: Dg Chest 2 View  Result Date: 03/29/2017 CLINICAL DATA:  Sickle cell disease with pain EXAM: CHEST  2 VIEW COMPARISON:  January 01, 2017 FINDINGS: Lungs are clear. Heart size and pulmonary  vascularity are normal. No adenopathy. 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 Somewhat generalized bony sclerosis is consistent with known sickle cell disease. IMPRESSION: Bony changes indicative of sickle cell disease. Note that there are multiple thoracic spine endplate infarcts. No edema or consolidation. Heart size normal. No evident adenopathy. Electronically Signed   By: Lowella Grip III M.D.   On: 03/29/2017 14:34    Medications: I have reviewed the patient's current medications.  Assessment/Plan: 27-year-old gentleman admitted with sickle cell painful crisis.  #1 sickle cell painful crisis, patient will be maintained on his Dilaudid PCA with Toradol and IV fluids.  Pain is decreasing but not completely gone.  No evidence of any chest or lung pathology at this point.  Hydrate the patient and mobilize patient.  #2 reported chest pain: Appears to be chest wall pain.  Continue pain management of sickle cell disease  #3 leukocytosis: Secondary to sickle cell crisis.  Continue to monitor  #4 anemia of chronic disease: Hemoglobin is stable.  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  He does not want to restart. No evidence of another PE at the moment  #6 depression: Patient is not taking any antidepressants again.  He has been on Lexapro before.  I will discuss restarting it if he agrees.   LOS: 2 days   Darshawn Boateng,LAWAL 03/31/2017, 9:30 AM

## 2017-03-31 NOTE — Progress Notes (Signed)
Rounded on patient as patient's pump was beeping.  As I entered room patient had CO2 monitor off at which time I asked him to place it back on as it was an important piece in keeping him safe.  I then proceeded to attempt to fix the pump from beeping at which time the patient grabbed the pump from me and stated "I got it.  I can do it".  I then educated the patient that he should not be manipulating his pump and to be sure that he should always notify his nurse when his pump is beeping.  I then locked the pump from behind and in speaking to the night nurse Dorthula Rue, RN she had been doing that all night and the patient continues to unlock it.  Day nurse Maudie Mercury has been notified and will continue to monitor.

## 2017-04-01 NOTE — Progress Notes (Signed)
Subjective: Patient doing better now. No fever, no nausea or vomiting. Pain is down to 6/10. He is on Dilaudid PCA and used 30 mg with 94 demands and 60 deliveries.   Objective: Vital signs in last 24 hours: Temp:  [98.2 F (36.8 C)-100.5 F (38.1 C)] 99.1 F (37.3 C) (02/16 0611) Pulse Rate:  [75-124] 99 (02/16 1016) Resp:  [10-20] 16 (02/16 1016) BP: (115-129)/(66-87) 127/82 (02/16 1016) SpO2:  [90 %-99 %] 98 % (02/16 1016) Weight change:  Last BM Date: 03/29/17  Intake/Output from previous day: 02/15 0701 - 02/16 0700 In: 3066.7 [P.O.:960; I.V.:2106.7] Out: 2350 [Urine:2350] Intake/Output this shift: No intake/output data recorded.  General appearance: alert, cooperative, appears stated age and no distress Neck: no adenopathy, no carotid bruit, no JVD, supple, symmetrical, trachea midline and thyroid not enlarged, symmetric, no tenderness/mass/nodules Back: symmetric, no curvature. ROM normal. No CVA tenderness. Resp: clear to auscultation bilaterally Cardio: regular rate and rhythm, S1, S2 normal, no murmur, click, rub or gallop GI: soft, non-tender; bowel sounds normal; no masses,  no organomegaly Extremities: extremities normal, atraumatic, no cyanosis or edema Pulses: 2+ and symmetric Skin: Skin color, texture, turgor normal. No rashes or lesions Neurologic: Grossly normal  Lab Results: Recent Labs    03/29/17 1345 03/30/17 0110  WBC 12.8* 12.8*  HGB 10.7* 10.1*  HCT 30.8* 29.0*  PLT 347 316   BMET Recent Labs    03/29/17 1345 03/30/17 0110  NA 138 140  K 3.5 3.5  CL 104 104  CO2 24 26  GLUCOSE 86 91  BUN 10 7  CREATININE 0.58* 0.49*  CALCIUM 8.6* 8.7*    Studies/Results: No results found.  Medications: I have reviewed the patient's current medications.  Assessment/Plan: 27-year-old gentleman admitted with sickle cell painful crisis.  #1 sickle cell painful crisis: patient is doing much better. Continue current regimen.  Mobilize patient and  plan for possible discharge tomorrow.  Continue oral pain medications.  #2 reported chest pain: Appears to be chest wall pain.  Much improved today.  Not as painful as it was yesterday.Continue pain management of sickle cell disease  #3 leukocytosis: Secondary to sickle cell crisis.  Continue to monitor  #4 anemia of chronic disease: Hemoglobin is stable.  Continue close monitoring. 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  He does not want to restart. No evidence of another PE at the moment  #6 depression: Patient is not taking any antidepressants again.  He has been on Lexapro before.  I will continue to discuss restarting it if he agrees.   LOS: 3 days   Jurline Folger,LAWAL 04/01/2017, 11:41 AM

## 2017-04-01 NOTE — Progress Notes (Signed)
Patient has refused most oral meds today after numerous attempts by nurse. Will continue to monitor.

## 2017-04-01 NOTE — Progress Notes (Signed)
Nurse entered room to find CO2 detector and O2 sensors off. When nurse asked patient what happened to monitors patient states that he threw them away. Nurse reiterated to patient as before during shift change that monitors must be worn in order to continue with PCA. Patient is apologetic stating that he thought he was in [name] where he does not have to wear this equipment. Nurse has placed new monitors on patient and has locked the IV pump for the second time this am. Will continue to monitor patient.

## 2017-04-02 LAB — CBC WITH DIFFERENTIAL/PLATELET
Basophils Absolute: 0 10*3/uL (ref 0.0–0.1)
Basophils Relative: 0 %
Eosinophils Absolute: 0.1 10*3/uL (ref 0.0–0.7)
Eosinophils Relative: 1 %
HCT: 23.7 % — ABNORMAL LOW (ref 39.0–52.0)
Hemoglobin: 8.3 g/dL — ABNORMAL LOW (ref 13.0–17.0)
Lymphocytes Relative: 25 %
Lymphs Abs: 2.4 10*3/uL (ref 0.7–4.0)
MCH: 29.9 pg (ref 26.0–34.0)
MCHC: 35 g/dL (ref 30.0–36.0)
MCV: 85.3 fL (ref 78.0–100.0)
Monocytes Absolute: 1.2 10*3/uL — ABNORMAL HIGH (ref 0.1–1.0)
Monocytes Relative: 12 %
Neutro Abs: 6 10*3/uL (ref 1.7–7.7)
Neutrophils Relative %: 62 %
Platelets: 324 10*3/uL (ref 150–400)
RBC: 2.78 MIL/uL — ABNORMAL LOW (ref 4.22–5.81)
RDW: 19.2 % — ABNORMAL HIGH (ref 11.5–15.5)
WBC: 9.8 10*3/uL (ref 4.0–10.5)

## 2017-04-02 LAB — COMPREHENSIVE METABOLIC PANEL
ALT: 16 U/L — ABNORMAL LOW (ref 17–63)
AST: 31 U/L (ref 15–41)
Albumin: 3.7 g/dL (ref 3.5–5.0)
Alkaline Phosphatase: 69 U/L (ref 38–126)
Anion gap: 7 (ref 5–15)
BUN: 7 mg/dL (ref 6–20)
CO2: 30 mmol/L (ref 22–32)
Calcium: 8.7 mg/dL — ABNORMAL LOW (ref 8.9–10.3)
Chloride: 101 mmol/L (ref 101–111)
Creatinine, Ser: 0.48 mg/dL — ABNORMAL LOW (ref 0.61–1.24)
GFR calc Af Amer: >60 mL/min (ref >60)
GFR calc non Af Amer: >60 mL/min (ref >60)
Glucose, Bld: 99 mg/dL (ref 65–99)
Potassium: 3.4 mmol/L — ABNORMAL LOW (ref 3.5–5.1)
Sodium: 138 mmol/L (ref 135–145)
Total Bilirubin: 3.7 mg/dL — ABNORMAL HIGH (ref 0.3–1.2)
Total Protein: 7.4 g/dL (ref 6.5–8.1)

## 2017-04-02 NOTE — Progress Notes (Signed)
Subjective: Patient has no ongoing pain in the moment however he is hallucinating. He is talking to himself and very aggressive. He has history of severe depression with psychotic features apparently but has been off medications for a while. His pain is still rated as 5 out of 10. H&H has remained stable.  Objective: Vital signs in last 24 hours: Temp:  [98.2 F (36.8 C)-100.3 F (37.9 C)] 100.3 F (37.9 C) (02/17 2348) Pulse Rate:  [75-88] 88 (02/17 2348) Resp:  [13-20] 17 (02/17 2348) BP: (115-125)/(56-81) 116/71 (02/17 2348) SpO2:  [93 %-100 %] 100 % (02/17 2348) Weight change:  Last BM Date: 03/29/17  Intake/Output from previous day: 02/16 0701 - 02/17 0700 In: 360 [P.O.:360] Out: 1500 [Urine:1500] Intake/Output this shift: No intake/output data recorded.  General appearance: alert, cooperative, appears stated age and no distress Neck: no adenopathy, no carotid bruit, no JVD, supple, symmetrical, trachea midline and thyroid not enlarged, symmetric, no tenderness/mass/nodules Back: symmetric, no curvature. ROM normal. No CVA tenderness. Resp: clear to auscultation bilaterally Cardio: regular rate and rhythm, S1, S2 normal, no murmur, click, rub or gallop GI: soft, non-tender; bowel sounds normal; no masses,  no organomegaly Extremities: extremities normal, atraumatic, no cyanosis or edema Pulses: 2+ and symmetric Skin: Skin color, texture, turgor normal. No rashes or lesions Neurologic: Grossly normal  Lab Results: Recent Labs    04/02/17 0812  WBC 9.8  HGB 8.3*  HCT 23.7*  PLT 324   BMET Recent Labs    04/02/17 0812  NA 138  K 3.4*  CL 101  CO2 30  GLUCOSE 99  BUN 7  CREATININE 0.48*  CALCIUM 8.7*    Studies/Results: No results found.  Medications: I have reviewed the patient's current medications.  Assessment/Plan: 27 year old gentleman admitted with sickle cell painful crisis.  #1 acute psychotic features: Patient has been off his medications. I  will get psychiatric consult. Defer patient's discharge until seen by psychiatry.  #2 sickle cell painful crisis: Pain is essentially controlled. No change in pain medications. Wean off PCA.  Continue oral pain medications.  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  Continue to monitor  #5 anemia of chronic disease: Hemoglobin is stable.  Continue close monitoring. 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  He does not want to restart. No evidence of another PE at the moment  #7 depression: Patient is not taking any antidepressants again.  He has been on Lexapro before.  I will continue to discuss restarting it if he agrees.   LOS: 4 days   Zadok Holaway,LAWAL 04/02/2017, 11:51 PM

## 2017-04-03 DIAGNOSIS — F4325 Adjustment disorder with mixed disturbance of emotions and conduct: Secondary | ICD-10-CM

## 2017-04-03 DIAGNOSIS — D5700 Hb-SS disease with crisis, unspecified: Principal | ICD-10-CM

## 2017-04-03 DIAGNOSIS — Z79891 Long term (current) use of opiate analgesic: Secondary | ICD-10-CM

## 2017-04-03 DIAGNOSIS — F4324 Adjustment disorder with disturbance of conduct: Secondary | ICD-10-CM

## 2017-04-03 DIAGNOSIS — F339 Major depressive disorder, recurrent, unspecified: Secondary | ICD-10-CM

## 2017-04-03 DIAGNOSIS — Z79899 Other long term (current) drug therapy: Secondary | ICD-10-CM

## 2017-04-03 DIAGNOSIS — F1210 Cannabis abuse, uncomplicated: Secondary | ICD-10-CM

## 2017-04-03 LAB — CBC WITH DIFFERENTIAL/PLATELET
Basophils Absolute: 0 10*3/uL (ref 0.0–0.1)
Basophils Relative: 0 %
Eosinophils Absolute: 0.1 10*3/uL (ref 0.0–0.7)
Eosinophils Relative: 1 %
HCT: 21.5 % — ABNORMAL LOW (ref 39.0–52.0)
Hemoglobin: 7.7 g/dL — ABNORMAL LOW (ref 13.0–17.0)
Lymphocytes Relative: 19 %
Lymphs Abs: 2 10*3/uL (ref 0.7–4.0)
MCH: 30.3 pg (ref 26.0–34.0)
MCHC: 35.8 g/dL (ref 30.0–36.0)
MCV: 84.6 fL (ref 78.0–100.0)
Monocytes Absolute: 1 10*3/uL (ref 0.1–1.0)
Monocytes Relative: 9 %
Neutro Abs: 7.4 10*3/uL (ref 1.7–7.7)
Neutrophils Relative %: 71 %
Platelets: 371 10*3/uL (ref 150–400)
RBC: 2.54 MIL/uL — ABNORMAL LOW (ref 4.22–5.81)
RDW: 19.3 % — ABNORMAL HIGH (ref 11.5–15.5)
WBC: 10.5 10*3/uL (ref 4.0–10.5)

## 2017-04-03 LAB — RETICULOCYTES
RBC.: 2.56 MIL/uL — ABNORMAL LOW (ref 4.22–5.81)
Retic Count, Absolute: 107.5 10*3/uL (ref 19.0–186.0)
Retic Ct Pct: 4.2 % — ABNORMAL HIGH (ref 0.4–3.1)

## 2017-04-03 LAB — COMPREHENSIVE METABOLIC PANEL
ALT: 17 U/L (ref 17–63)
AST: 29 U/L (ref 15–41)
Albumin: 3.5 g/dL (ref 3.5–5.0)
Alkaline Phosphatase: 64 U/L (ref 38–126)
Anion gap: 9 (ref 5–15)
BUN: 5 mg/dL — ABNORMAL LOW (ref 6–20)
CO2: 25 mmol/L (ref 22–32)
Calcium: 8.6 mg/dL — ABNORMAL LOW (ref 8.9–10.3)
Chloride: 101 mmol/L (ref 101–111)
Creatinine, Ser: 0.54 mg/dL — ABNORMAL LOW (ref 0.61–1.24)
GFR calc Af Amer: >60 mL/min (ref >60)
GFR calc non Af Amer: >60 mL/min (ref >60)
Glucose, Bld: 111 mg/dL — ABNORMAL HIGH (ref 65–99)
Potassium: 3.4 mmol/L — ABNORMAL LOW (ref 3.5–5.1)
Sodium: 135 mmol/L (ref 135–145)
Total Bilirubin: 2.4 mg/dL — ABNORMAL HIGH (ref 0.3–1.2)
Total Protein: 7.2 g/dL (ref 6.5–8.1)

## 2017-04-03 LAB — MAGNESIUM: Magnesium: 1.7 mg/dL (ref 1.7–2.4)

## 2017-04-03 MED ORDER — ENSURE ENLIVE PO LIQD
237.0000 mL | Freq: Three times a day (TID) | ORAL | Status: DC
  Administered 2017-04-03 – 2017-04-05 (×6): 237 mL via ORAL

## 2017-04-03 MED ORDER — POTASSIUM CHLORIDE CRYS ER 20 MEQ PO TBCR
40.0000 meq | EXTENDED_RELEASE_TABLET | Freq: Every day | ORAL | Status: DC
  Administered 2017-04-03 – 2017-04-05 (×3): 40 meq via ORAL
  Filled 2017-04-03 (×3): qty 2

## 2017-04-03 MED ORDER — IBUPROFEN 800 MG PO TABS
800.0000 mg | ORAL_TABLET | Freq: Four times a day (QID) | ORAL | Status: DC
  Administered 2017-04-03 – 2017-04-05 (×6): 800 mg via ORAL
  Filled 2017-04-03 (×7): qty 1

## 2017-04-03 MED ORDER — HYDROXYUREA 500 MG PO CAPS
1000.0000 mg | ORAL_CAPSULE | Freq: Every day | ORAL | Status: DC
  Administered 2017-04-04 – 2017-04-05 (×2): 1000 mg via ORAL
  Filled 2017-04-03 (×2): qty 2

## 2017-04-03 NOTE — Consult Note (Signed)
[facility] Face-to-Face Psychiatry Consult   Reason for Consult:  Hallucinations Referring Physician:  Dr. Zigmund Daniel Patient Identification: Timothy Marks MRN:  [id number] Principal Diagnosis: Adjustment disorder with disturbance of conduct Diagnosis:   Patient Active Problem List   Diagnosis Date Noted  . Chest pain [R07.9] 03/29/2017  . Constipation [K59.00] 08/03/2014  . 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 Leukocytosis [D72.829] 06/30/2014  . Fever [R50.9] 05/07/2014  . Pain and swelling of left knee [M25.562, M25.462] 05/07/2014  . Sickle cell anemia with crisis ([place]) [D57.00] 05/06/2014  . h/o Priapism [N48.30] 05/06/2014  . Protein-calorie malnutrition, severe ([place]) [E43] 04/09/2014  . Major depressive disorder, recurrent, severe without psychotic features ([place]) [F33.2]   . 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 Vitamin D deficiency [E55.9]   . Sickle cell pain crisis ([place]) [D57.00] 03/30/2014  . Sickle cell anemia ([place]) [D57.1] 03/30/2014  . Hyperbilirubinemia [E80.6] 03/30/2014  . Hypokalemia [E87.6] 02/07/2014  . Sickle cell crisis ([place]) [D57.00] 03/03/2013  . Aggressive behavior [R46.89] 01/25/2013    Total Time spent with patient: 1 hour  Subjective:   Timothy Marks is a 27 y.o. male patient admitted with sickle cell crisis.  HPI:   Per chart review, patient was admitted with sickle cell crisis on 2/13. He reported hallucinations yesterday to his treatment team. He has been aggressive as well. He has a history of depression. He has not been taking psychotropic medications.   Of note, he was last seen by the psychiatry consult service in 03/2014 for mania, inappropriate behaviors and cannabis abuse. He was diagnosed with MDD, recurrent without psychotic symptoms. He was restarted on Lexapro 10 mg daily and Atarax 50 mg qhs for anxiety and insomnia. He was discharged home with outpatient psychiatric services.   On  interview, Timothy Marks reports no problems with his mood. He denies problems with appetite or sleep. He reports weight loss but he is unsure how much. He reports enjoying activities such as football, basketball and shooting pool with friends. He denies SI, HI or AVH. He does reports finding comfort in speaking to deceased relatives when he is not in a good mood. He took Lexapro in the past but he does not feel like he needs a psychotropic medication at this time.   Past Psychiatric History: recurrent MDD and marijuana use disorder.  Risk to Self: Is patient at risk for suicide?: No Risk to Others:  None. Denies HI.  Prior Inpatient Therapy:  Denies  Prior Outpatient Therapy:  Denies   Past Medical History:  Past Medical History:  Diagnosis Date  . Depression   . 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 Sickle cell anemia (HCC)   . Vitamin D deficiency y-6    Past Surgical History:  Procedure Laterality Date  . CHOLECYSTECTOMY     Family History:  Family History  Problem Relation Age of Onset  . Sickle cell trait Mother   . Sickle cell trait Father    Family Psychiatric  History: Denies  Social History:  Social History   Substance and Sexual Activity  Alcohol Use No     Social History   Substance and Sexual Activity  Drug Use Yes  . Types: Marijuana    Social History   Socioeconomic History  . Marital status: Single    Spouse name: None  . Number of children: None  . Years of education: None  .  Highest education level: None  Social Needs  . Financial resource strain: None  . Food insecurity - worry: None  . Food insecurity - inability: None  . Transportation needs - medical: None  . Transportation needs - non-medical: None  Occupational History  . None  Tobacco Use  . Smoking status: Never Smoker  . Smokeless tobacco: Never Used  Substance and Sexual Activity  . Alcohol use: No  . Drug use: Yes     Types: Marijuana  . Sexual activity: None  Other Topics Concern  . None  Social History Narrative  . None   Additional Social History: He lives with his mother. He has a 60 y/o son who lives with his mother. He reports visiting his son often. He is unemployed and receives disability. He denies alcohol, illicit substance or tobacco use.     Allergies:   Allergies  Allergen Reactions  . Levaquin [Levofloxacin] Itching  . Morphine And Related Hives  . Toradol [Ketorolac Tromethamine] Itching  . Tramadol Hives  . Vancomycin Itching  . Zosyn [Piperacillin Sod-Tazobactam So] Itching and Rash    Has taken rocephin in past    Labs:  Results for orders placed or performed during the hospital encounter of 03/29/17 (from the past 48 hour(s))  CBC with Differential/Platelet     Status: Abnormal   Collection Time: 04/02/17  8:12 AM  Result Value Ref Range   WBC 9.8 4.0 - 10.5 K/uL   RBC 2.78 (L) 4.22 - 5.81 MIL/uL   Hemoglobin 8.3 (L) 13.0 - 17.0 g/dL   HCT 23.7 (L) 39.0 - 52.0 %   MCV 85.3 78.0 - 100.0 fL   MCH 29.9 26.0 - 34.0 pg   MCHC 35.0 30.0 - 36.0 g/dL   RDW 19.2 (H) 11.5 - 15.5 %   Platelets 324 150 - 400 K/uL   Neutrophils Relative % 62 %   Neutro Abs 6.0 1.7 - 7.7 K/uL   Lymphocytes Relative 25 %   Lymphs Abs 2.4 0.7 - 4.0 K/uL   Monocytes Relative 12 %   Monocytes Absolute 1.2 (H) 0.1 - 1.0 K/uL   Eosinophils Relative 1 %   Eosinophils Absolute 0.1 0.0 - 0.7 K/uL   Basophils Relative 0 %   Basophils Absolute 0.0 0.0 - 0.1 K/uL    Comment: Performed at [facility], [place] [address], [place], [place] [address]  Comprehensive metabolic panel     Status: Abnormal   Collection Time: 04/02/17  8:12 AM  Result Value Ref Range   Sodium 138 135 - 145 mmol/L   Potassium 3.4 (L) 3.5 - 5.1 mmol/L   Chloride 101 101 - 111 mmol/L   CO2 30 22 - 32 mmol/L   Glucose, Bld 99 65 - 99 mg/dL   BUN 7 6 - 20 mg/dL   Creatinine, Ser 0.48 (L) 0.61 - 1.24 mg/dL    Calcium 8.7 (L) 8.9 - 10.3 mg/dL   Total Protein 7.4 6.5 - 8.1 g/dL   Albumin 3.7 3.5 - 5.0 g/dL   AST 31 15 - 41 U/L   ALT 16 (L) 17 - 63 U/L   Alkaline Phosphatase 69 38 - 126 U/L   Total Bilirubin 3.7 (H) 0.3 - 1.2 mg/dL   GFR calc non Af Amer >60 >60 mL/min   GFR calc Af Amer >60 >60 mL/min    Comment: (NOTE) The eGFR has been calculated using the CKD EPI equation. This calculation has not been validated in all clinical  situations. eGFR's persistently <60 mL/min signify possible Chronic Kidney Disease.    Anion gap 7 5 - 15    Comment: Performed at [facility], [place] [address] [place], [place] [address]  CBC with Differential/Platelet     Status: Abnormal   Collection Time: 04/03/17  8:05 AM  Result Value Ref Range   WBC 10.5 4.0 - 10.5 K/uL   RBC 2.54 (L) 4.22 - 5.81 MIL/uL   Hemoglobin 7.7 (L) 13.0 - 17.0 g/dL   HCT 21.5 (L) 39.0 - 52.0 %   MCV 84.6 78.0 - 100.0 fL   MCH 30.3 26.0 - 34.0 pg   MCHC 35.8 30.0 - 36.0 g/dL   RDW 19.3 (H) 11.5 - 15.5 %   Platelets 371 150 - 400 K/uL   Neutrophils Relative % 71 %   Neutro Abs 7.4 1.7 - 7.7 K/uL   Lymphocytes Relative 19 %   Lymphs Abs 2.0 0.7 - 4.0 K/uL   Monocytes Relative 9 %   Monocytes Absolute 1.0 0.1 - 1.0 K/uL   Eosinophils Relative 1 %   Eosinophils Absolute 0.1 0.0 - 0.7 K/uL   Basophils Relative 0 %   Basophils Absolute 0.0 0.0 - 0.1 K/uL    Comment: Performed at [facility], [place] [address] [place], [place] [address]  Comprehensive metabolic panel     Status: Abnormal   Collection Time: 04/03/17  8:05 AM  Result Value Ref Range   Sodium 135 135 - 145 mmol/L   Potassium 3.4 (L) 3.5 - 5.1 mmol/L   Chloride 101 101 - 111 mmol/L   CO2 25 22 - 32 mmol/L   Glucose, Bld 111 (H) 65 - 99 mg/dL   BUN 5 (L) 6 - 20 mg/dL   Creatinine, Ser 0.54 (L) 0.61 - 1.24 mg/dL   Calcium 8.6 (L) 8.9 - 10.3 mg/dL   Total Protein 7.2 6.5 - 8.1 g/dL   Albumin 3.5 3.5 - 5.0 g/dL   AST 29 15 -  41 U/L   ALT 17 17 - 63 U/L   Alkaline Phosphatase 64 38 - 126 U/L   Total Bilirubin 2.4 (H) 0.3 - 1.2 mg/dL   GFR calc non Af Amer >60 >60 mL/min   GFR calc Af Amer >60 >60 mL/min    Comment: (NOTE) The eGFR has been calculated using the CKD EPI equation. This calculation has not been validated in all clinical situations. eGFR's persistently <60 mL/min signify possible Chronic Kidney Disease.    Anion gap 9 5 - 15    Comment: Performed at [facility], [place] [address], [place], [place] [address]  Reticulocytes     Status: Abnormal   Collection Time: 04/03/17  8:05 AM  Result Value Ref Range   Retic Ct Pct 4.2 (H) 0.4 - 3.1 %   RBC. 2.56 (L) 4.22 - 5.81 MIL/uL   Retic Count, Absolute 107.5 19.0 - 186.0 K/uL    Comment: Performed at [facility], [place] [address] [place], [place] [address]  Magnesium     Status: None   Collection Time: 04/03/17  8:05 AM  Result Value Ref Range   Magnesium 1.7 1.7 - 2.4 mg/dL    Comment: Performed at [facility], [place] [address] [place],  [address]    Current Facility-Administered Medications  Medication Dose Route Frequency Provider Last Rate Last Dose  . cholecalciferol (VITAMIN D) tablet 2,000 Units  2,000 Units Oral Daily Alben Deeds  Brooke Bonito., MD   2,000 Units at 04/03/17 1031  . dextrose 5 %-0.45 % sodium chloride infusion   Intravenous Continuous Leana Gamer, MD 100 mL/hr at 04/02/17 1137    . diphenhydrAMINE (BENADRYL) capsule 25 mg  25 mg Oral Q4H PRN Schorr, Rhetta Mura, NP       Or  . diphenhydrAMINE (BENADRYL) 25 mg in sodium chloride 0.9 % 50 mL IVPB  25 mg Intravenous Q4H PRN Schorr, Rhetta Mura, NP      . enoxaparin (LOVENOX) injection 40 mg  40 mg Subcutaneous Q24H Elodia Florence., MD   40 mg at 04/02/17 2159  . folic acid (FOLVITE) tablet 1 mg  1 mg Oral Daily Elodia Florence., MD   1 mg at 04/03/17 1031  . HYDROmorphone (DILAUDID) 1 mg/mL PCA  injection   Intravenous Q4H Leana Gamer, MD      . hydroxyurea (HYDREA) capsule 500 mg  500 mg Oral BID Elodia Florence., MD   500 mg at 04/03/17 1031  . ibuprofen (ADVIL,MOTRIN) tablet 800 mg  800 mg Oral TID Elodia Florence., MD   800 mg at 04/02/17 2159  . naloxone [facility]) injection 0.4 mg  0.4 mg Intravenous PRN Schorr, Rhetta Mura, NP       And  . sodium chloride flush (NS) 0.9 % injection 9 mL  9 mL Intravenous PRN Schorr, Rhetta Mura, NP      . ondansetron (ZOFRAN) injection 4 mg  4 mg Intravenous Q6H PRN Schorr, Rhetta Mura, NP   4 mg at 04/01/17 0905  . oxyCODONE (Oxy IR/ROXICODONE) immediate release tablet 10 mg  10 mg Oral Q4H Leana Gamer, MD   10 mg at 04/03/17 1217  . polyethylene glycol (MIRALAX / GLYCOLAX) packet 17 g  17 g Oral Daily Elodia Florence., MD      . senna-docusate (Senokot-S) tablet 1 tablet  1 tablet Oral BID Elodia Florence., MD   1 tablet at 04/03/17 1031    Musculoskeletal: Strength & Muscle Tone: within normal limits Gait & Station: UTA since patient was lying in bed. Patient leans: N/A  Psychiatric Specialty Exam: Physical Exam  Nursing note and vitals reviewed. Constitutional: He is oriented to person, place, and time. He appears well-developed.  Thin  HENT:  Head: Normocephalic and atraumatic.  Neck: Normal range of motion.  Respiratory: Effort normal.  Neurological: He is alert and oriented to person, place, and time.  Skin: No rash noted.  Psychiatric: He has a normal mood and affect. His speech is normal and behavior is normal. Judgment and thought content normal. Cognition and memory are normal.    Review of Systems  Constitutional: Negative for chills and fever.  Cardiovascular: Positive for chest pain.  Musculoskeletal: Positive for back pain and myalgias.  Psychiatric/Behavioral: Negative for depression, hallucinations, substance abuse and suicidal ideas. The patient is not nervous/anxious and does  not have insomnia.   All other systems reviewed and are negative.   Blood pressure 113/65, pulse 95, temperature 100 F (37.8 C), temperature source Oral, resp. rate 18, height 6' 2"  (1.88 m), weight 54.5 kg (120 lb 2.4 oz), SpO2 98 %.Body mass index is 15.43 kg/m.  General Appearance: Fairly Groomed, young, thin, African American male with body tattoos and lying in bed. NAD.   Eye Contact:  Good  Speech:  Clear and Coherent and Normal Rate  Volume:  Normal  Mood:  Euthymic  Affect:  Constricted  Thought Process:  Goal Directed, Linear and Descriptions of Associations: Intact  Orientation:  Full (Time, Place, and Person)  Thought Content:  Logical  Suicidal Thoughts:  No  Homicidal Thoughts:  No  Memory:  Immediate;   Good Recent;   Good Remote;   Good  Judgement:  Fair  Insight:  Fair  Psychomotor Activity:  Normal  Concentration:  Concentration: Good and Attention Span: Good  Recall:  Good  Fund of Knowledge:  Good  Language:  Good  Akathisia:  No  Handed:  Right  AIMS (if indicated):   N/A  Assets:  Communication Skills Desire for Improvement Financial Resources/Insurance Housing Social Support  ADL's:  Intact  Cognition:  WNL  Sleep:   Okay   Assessment:  Errick Salts is a 27 y.o. male who was admitted with sickle cell crisis. There was concern for psychosis so psychiatry was consulted. Patient denies AVH but does report that he finds comfort in speaking to his deceased relatives when he is not feeling well. He does not appear to be responding to internal stimuli and was pleasant throughout interview. He was noted to be agitated during hospitalization by his primary treatment team and this could likely be secondary to frustration secondary to his poorly managed pain. He is most likely presenting with an adjustment disorder. He denies SI and HI. He declines psychotropic medications at this time because he denies active psychiatric problems. He does not warrant inpatient  psychiatric hospitalization.   Treatment Plan Summary: -Patient is psychiatrically cleared. Please consult psychiatry again as needed. -Please have unit SW provide patient with resources for outpatient mental health follow up if needed in the future.   Disposition: No evidence of imminent risk to self or others at present.   Patient does not meet criteria for psychiatric inpatient admission.  Faythe Dingwall, DO 04/03/2017 12:19 PM

## 2017-04-03 NOTE — Progress Notes (Signed)
SICKLE CELL SERVICE PROGRESS NOTE  Timothy Marks JHE:[id number] DOB: [birth year] DOA: 03/29/2017 PCP: Ricke Hey, MD  Assessment/Plan: Principal Problem:   Adjustment disorder with disturbance of conduct Active Problems:   Sickle cell pain crisis ([place])   Leukocytosis   Hb-SS disease with crisis ([place])   Chest pain  1. Hb SS with Crisis: Continue PCA and scheduled Oxycodone. Continue Ibuprofen. Will re-assess tomorrow. He normally takes 20 mg at each dose and if pain still elevated above baseline tomorrow will increase dose of Oxycodone to 20 mg .  2. Anemia of Chronic Disease: Hb currently at baseline. Continue Hydroxyurea. However will change dosing to 1000 mg daily starting tomorrow.  3. Protein Calorie Malnutrition: Ensure TID. Consult nutrition services.  4. Hallucinations: Pt reported having hallucinations over the weekend., He was seen inc consult by Psychiatry and recommendations noted.  5. Constipation: Pt has not had a BM since admission. Senna-S ordered and Miralax. He has been refusing Miralax and does not want any other laxatives. Tolerating diet well.   Code Status: Full Code Family Communication: N/A Disposition Plan: Anticipate discharge in 24-48 hours.   Renny Remer A.  Pager [phone]. If 7PM-7AM, please contact night-coverage.  04/03/2017, 5:03 PM  LOS: 5 days   Interim History: Pt reports pain at an intensity of 9/10 and localized to arms, legs, ribs and left. Hip. Pt is fixated on the idea that he has lost an excessive amount of weight since admission. I reviewed his records and noted that his weight was noted to be 55.7 kg (122#) on 02/21/2017 during an encounter at [facility]. He was noted to be 55.2 kg on admission here on 2/13.    Consultants:  Psychiatry- Dr. Mariea Clonts  Procedures:  [place]  Antibiotics:  None   Objective: Vitals:   04/03/17 0425 04/03/17 0816 04/03/17 1220 04/03/17 1303  BP: 113/65   127/65  Pulse: 95   79  Resp: 18 18 14  16   Temp: 100 F (37.8 C)   99.8 F (37.7 C)  TempSrc: Oral   Oral  SpO2: 98% 98% 100% 98%  Weight:      Height:       Weight change:   Intake/Output Summary (Last 24 hours) at 04/03/2017 1703 Last data filed at 04/03/2017 1304 Gross per 24 hour  Intake -  Output 2275 ml  Net -2275 ml      Physical Exam General: Alert, awake, oriented x3, in no acute distress.  HEENT: [place]/AT PEERL, EOMI, anicteric Neck: Trachea midline,  no masses, no thyromegal,y no JVD, no carotid bruit OROPHARYNX:  Moist, No exudate/ erythema/lesions.  Heart: Regular rate and rhythm, II/VI Systolic ejection murmur at base. There are no rubs or gallops, PMI non-displaced, no heaves or thrills on palpation.  Lungs: Clear to auscultation, no wheezing or rhonchi noted. No increased vocal fremitus resonant to percussion  Abdomen: Soft, nontender, nondistended, positive bowel sounds, no masses no hepatosplenomegaly noted.  Neuro: No focal neurological deficits noted cranial nerves II through XII grossly intact. . Strength at baseline in bilateral upper and lower extremities. Musculoskeletal: No warmth, swelling or erythema around joints, no spinal tenderness noted. Psychiatric: Patient alert and oriented x3, good insight and cognition, good recent to remote recall.    Data Reviewed: Basic Metabolic Panel: Recent Labs  Lab 03/28/17 0323 03/29/17 1345 03/30/17 0110 04/02/17 0812 04/03/17 0805  NA 139 138 140 138 135  K 3.4* 3.5 3.5 3.4* 3.4*  CL 102 104 104 101 101  CO2 25 24  26 30 25   GLUCOSE 95 86 91 99 111*  BUN 5* 10 7 7  5*  CREATININE 0.69 0.58* 0.49* 0.48* 0.54*  CALCIUM 8.9 8.6* 8.7* 8.7* 8.6*  MG  --   --   --   --  1.7   Liver Function Tests: Recent Labs  Lab 03/28/17 0323 03/29/17 1345 03/30/17 0110 04/02/17 0812 04/03/17 0805  AST 29 28 27 31 29   ALT 13* 17 14* 16* 17  ALKPHOS 65 86 88 69 64  BILITOT 2.5* 2.9* 2.5* 3.7* 2.4*  PROT 7.1 7.2 7.1 7.4 7.2  ALBUMIN 3.8 3.8 3.6 3.7 3.5    No results for input(s): LIPASE, AMYLASE in the last 168 hours. No results for input(s): AMMONIA in the last 168 hours. CBC: Recent Labs  Lab 03/28/17 0323 03/29/17 1345 03/30/17 0110 04/02/17 0812 04/03/17 0805  WBC 9.8 12.8* 12.8* 9.8 10.5  NEUTROABS 5.1 9.9* 9.0* 6.0 7.4  HGB 11.5* 10.7* 10.1* 8.3* 7.7*  HCT 33.5* 30.8* 29.0* 23.7* 21.5*  MCV 88.2 88.0 87.3 85.3 84.6  PLT 379 347 316 324 371   Cardiac Enzymes: Recent Labs  Lab 03/29/17 1945 03/30/17 0110 03/30/17 0719  TROPONINI <0.03 <0.03 <0.03   BNP (last 3 results) No results for input(s): BNP in the last 8760 hours.  ProBNP (last 3 results) No results for input(s): PROBNP in the last 8760 hours.  CBG: No results for input(s): GLUCAP in the last 168 hours.  No results found for this or any previous visit (from the past 240 hour(s)).   Studies: Dg Chest 2 View  Result Date: 03/29/2017 CLINICAL DATA:  Sickle cell disease with pain EXAM: CHEST  2 VIEW COMPARISON:  January 01, 2017 FINDINGS: Lungs are clear. Heart size and pulmonary vascularity are normal. No adenopathy. 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 Somewhat generalized bony sclerosis is consistent with known sickle cell disease. IMPRESSION: Bony changes indicative of sickle cell disease. Note that there are multiple thoracic spine endplate infarcts. No edema or consolidation. Heart size normal. No evident adenopathy. Electronically Signed   By: Lowella Grip III M.D.   On: 03/29/2017 14:34    Scheduled Meds: . cholecalciferol  2,000 Units Oral Daily  . enoxaparin (LOVENOX) injection  40 mg Subcutaneous Q24H  . feeding supplement (ENSURE ENLIVE)  237 mL Oral TID BM  . folic acid  1 mg Oral Daily  . HYDROmorphone   Intravenous Q4H  . [START ON 04/04/2017] hydroxyurea  1,000 mg Oral Daily  . hydroxyurea  500 mg Oral BID  . ibuprofen  800 mg Oral QID  . oxyCODONE  10 mg Oral Q4H  . polyethylene glycol  17 g Oral Daily  . potassium  chloride  40 mEq Oral Daily  . senna-docusate  1 tablet Oral BID   Continuous Infusions: . dextrose 5 % and 0.45% NaCl 100 mL/hr at 04/02/17 1137  . diphenhydrAMINE      Principal Problem:   Adjustment disorder with disturbance of conduct Active Problems:   Sickle cell pain crisis (HCC)   Leukocytosis   Hb-SS disease with crisis ([place])   Chest pain     In excess of 35 minutes spent during this visit. Greater than 50% involved face to face contact with the patient for assessment, counseling and coordination of care.

## 2017-04-04 DIAGNOSIS — G894 Chronic pain syndrome: Secondary | ICD-10-CM

## 2017-04-04 DIAGNOSIS — E46 Unspecified protein-calorie malnutrition: Secondary | ICD-10-CM

## 2017-04-04 DIAGNOSIS — D638 Anemia in other chronic diseases classified elsewhere: Secondary | ICD-10-CM

## 2017-04-04 MED ORDER — OXYCODONE HCL 5 MG PO TABS
20.0000 mg | ORAL_TABLET | ORAL | Status: DC
  Administered 2017-04-04 – 2017-04-05 (×6): 20 mg via ORAL
  Filled 2017-04-04 (×6): qty 4

## 2017-04-04 MED ORDER — HYDROMORPHONE 1 MG/ML IV SOLN
INTRAVENOUS | Status: DC
  Administered 2017-04-04: 4 mg via INTRAVENOUS
  Administered 2017-04-04: 12.5 mg via INTRAVENOUS
  Administered 2017-04-05: 5 mg via INTRAVENOUS
  Administered 2017-04-05: 4 mg via INTRAVENOUS
  Administered 2017-04-05: 3.5 mg via INTRAVENOUS
  Administered 2017-04-05: 4.5 mg via INTRAVENOUS
  Filled 2017-04-04: qty 25

## 2017-04-04 NOTE — Progress Notes (Addendum)
Nurse entered room for bedside report and found with O2 & CO2 sensor off. This nurse explained to patient that sensors must be worn to keep the PCA on. Patients pump is currently beeping and when nurse attempts to stop pump from beeping the patient says "I got it". Nurse then explains to patient that it is the nurses obligation to attend to the pump and that patient is not to manipulate. This nurse then locks the pump and the patient states "you do know that I know how to unlock that don't you". Nurse locks pump and makes patient aware that he is not to manipulate pump in any way. Patient states that he wants a new nurse. Charge nurse made aware. Assignment changed.

## 2017-04-04 NOTE — Progress Notes (Signed)
SICKLE CELL SERVICE PROGRESS NOTE  Timothy Marks HDQ:[id number] DOB: [birth year] DOA: 03/29/2017 PCP: Ricke Hey, MD  Assessment/Plan: Principal Problem:   Adjustment disorder with disturbance of conduct Active Problems:   Sickle cell pain crisis ([place])   Leukocytosis   Hb-SS disease with crisis ([place])   Chest pain  1. Hb SS with Crisis: Wean PCA and continue scheduled Oxycodone. Continue Ibuprofen. Anticipate discharge tomorrow.Increase dose of Oxycodone to 20 mg .  2. Anemia of Chronic Disease: Hb currently at baseline. Continue Hydroxyurea.Check Hb tomorrow. 3. Protein Calorie Malnutrition: Ensure TID. Nutrition services consulted.  4. Hallucinations: Pt seen by Psychiatry. No intervention recommended at this time.  5. Constipation: Pt has not had a BM since admission. He contniues to refuse any therapy to facilitate a BM. Tolerating diet well.   Code Status: Full Code Family Communication: N/A Disposition Plan: Anticipate discharge tomorrow.   Jon Kasparek A.  Pager [phone]. If 7PM-7AM, please contact night-coverage.  04/04/2017, 6:13 PM  LOS: 6 days   Interim History: Pt reports pain at an intensity of 9/10 and localized to arms, legs, ribs and left. Hip. Pt states that he normally uses more medication than is prescribed and when he runs out of medication, he "bums"  medications off friends and family until it's time for his prescription to be refilled. Pt refusing to get out of bed or do any walking.   Consultants:  Psychiatry- Dr. Mariea Clonts  Procedures:  None  Antibiotics:  None   Objective: Vitals:   04/04/17 1220 04/04/17 1411 04/04/17 1624 04/04/17 1722  BP:  118/69  120/69  Pulse:  (!) 106  79  Resp: 11 14 12 10   Temp:    99 F (37.2 C)  TempSrc:    Oral  SpO2: 100% 100%  98%  Weight:      Height:       Weight change:   Intake/Output Summary (Last 24 hours) at 04/04/2017 1813 Last data filed at 04/04/2017 1411 Gross per 24 hour  Intake -   Output 2225 ml  Net -2225 ml      Physical Exam General: Alert, awake, oriented x3, in no apparent distress. Emaciated appearing. HEENT: [place]/AT PEERL, EOMI, anicteric Neck: Trachea midline,  no masses, no thyromegal,y no JVD, no carotid bruit OROPHARYNX:  Moist, No exudate/ erythema/lesions.  Heart: Regular rate and rhythm, II/VI Systolic ejection murmur at base. There are no rubs or gallops, PMI non-displaced, no heaves or thrills on palpation.  Lungs: Clear to auscultation, no wheezing or rhonchi noted. No increased vocal fremitus resonant to percussion  Abdomen: Soft, nontender, nondistended, positive bowel sounds, no masses no hepatosplenomegaly noted.  Neuro: No focal neurological deficits noted cranial nerves II through XII grossly intact. . Strength at baseline in bilateral upper and lower extremities. Musculoskeletal: No warmth, swelling or erythema around joints, no spinal tenderness noted. Psychiatric: Patient alert and oriented x3, good insight and cognition, good recent to remote recall.    Data Reviewed: Basic Metabolic Panel: Recent Labs  Lab 03/29/17 1345 03/30/17 0110 04/02/17 0812 04/03/17 0805  NA 138 140 138 135  K 3.5 3.5 3.4* 3.4*  CL 104 104 101 101  CO2 24 26 30 25   GLUCOSE 86 91 99 111*  BUN 10 7 7  5*  CREATININE 0.58* 0.49* 0.48* 0.54*  CALCIUM 8.6* 8.7* 8.7* 8.6*  MG  --   --   --  1.7   Liver Function Tests: Recent Labs  Lab 03/29/17 1345 03/30/17 0110 04/02/17 0814 04/03/17 0805  AST 28 27 31 29   ALT 17 14* 16* 17  ALKPHOS 86 88 69 64  BILITOT 2.9* 2.5* 3.7* 2.4*  PROT 7.2 7.1 7.4 7.2  ALBUMIN 3.8 3.6 3.7 3.5   No results for input(s): LIPASE, AMYLASE in the last 168 hours. No results for input(s): AMMONIA in the last 168 hours. CBC: Recent Labs  Lab 03/29/17 1345 03/30/17 0110 04/02/17 0812 04/03/17 0805  WBC 12.8* 12.8* 9.8 10.5  NEUTROABS 9.9* 9.0* 6.0 7.4  HGB 10.7* 10.1* 8.3* 7.7*  HCT 30.8* 29.0* 23.7* 21.5*  MCV 88.0  87.3 85.3 84.6  PLT 347 316 324 371   Cardiac Enzymes: Recent Labs  Lab 03/29/17 1945 03/30/17 0110 03/30/17 0719  TROPONINI <0.03 <0.03 <0.03   BNP (last 3 results) No results for input(s): BNP in the last 8760 hours.  ProBNP (last 3 results) No results for input(s): PROBNP in the last 8760 hours.  CBG: No results for input(s): GLUCAP in the last 168 hours.  No results found for this or any previous visit (from the past 240 hour(s)).   Studies: Dg Chest 2 View  Result Date: 03/29/2017 CLINICAL DATA:  Sickle cell disease with pain EXAM: CHEST  2 VIEW COMPARISON:  January 01, 2017 FINDINGS: Lungs are clear. Heart size and pulmonary vascularity are normal. No adenopathy. 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 Somewhat generalized bony sclerosis is consistent with known sickle cell disease. IMPRESSION: Bony changes indicative of sickle cell disease. Note that there are multiple thoracic spine endplate infarcts. No edema or consolidation. Heart size normal. No evident adenopathy. Electronically Signed   By: Lowella Grip III M.D.   On: 03/29/2017 14:34    Scheduled Meds: . cholecalciferol  2,000 Units Oral Daily  . enoxaparin (LOVENOX) injection  40 mg Subcutaneous Q24H  . feeding supplement (ENSURE ENLIVE)  237 mL Oral TID BM  . folic acid  1 mg Oral Daily  . HYDROmorphone   Intravenous Q4H  . hydroxyurea  1,000 mg Oral Daily  . ibuprofen  800 mg Oral QID  . oxyCODONE  10 mg Oral Q4H  . polyethylene glycol  17 g Oral Daily  . potassium chloride  40 mEq Oral Daily  . senna-docusate  1 tablet Oral BID   Continuous Infusions: . dextrose 5 % and 0.45% NaCl 10 mL/hr at 04/03/17 1713  . diphenhydrAMINE      Principal Problem:   Adjustment disorder with disturbance of conduct Active Problems:   Sickle cell pain crisis ([facility])   Leukocytosis   Hb-SS disease with crisis ([place])   Chest pain     In excess of 35 minutes spent during this visit. Greater than  50% involved face to face contact with the patient for assessment, counseling and coordination of care.

## 2017-04-05 DIAGNOSIS — E440 Moderate protein-calorie malnutrition: Secondary | ICD-10-CM

## 2017-04-05 DIAGNOSIS — K029 Dental caries, unspecified: Secondary | ICD-10-CM

## 2017-04-05 DIAGNOSIS — K5909 Other constipation: Secondary | ICD-10-CM

## 2017-04-05 LAB — BASIC METABOLIC PANEL
Anion gap: 9 (ref 5–15)
BUN: 10 mg/dL (ref 6–20)
CO2: 30 mmol/L (ref 22–32)
Calcium: 9.8 mg/dL (ref 8.9–10.3)
Chloride: 100 mmol/L — ABNORMAL LOW (ref 101–111)
Creatinine, Ser: 0.63 mg/dL (ref 0.61–1.24)
GFR calc Af Amer: >60 mL/min (ref >60)
GFR calc non Af Amer: >60 mL/min (ref >60)
Glucose, Bld: 77 mg/dL (ref 65–99)
Potassium: 4.5 mmol/L (ref 3.5–5.1)
Sodium: 139 mmol/L (ref 135–145)

## 2017-04-05 LAB — CBC WITH DIFFERENTIAL/PLATELET
Basophils Absolute: 0 10*3/uL (ref 0.0–0.1)
Basophils Relative: 0 %
Eosinophils Absolute: 0.2 10*3/uL (ref 0.0–0.7)
Eosinophils Relative: 2 %
HCT: 23.9 % — ABNORMAL LOW (ref 39.0–52.0)
Hemoglobin: 8.3 g/dL — ABNORMAL LOW (ref 13.0–17.0)
Lymphocytes Relative: 15 %
Lymphs Abs: 2.3 10*3/uL (ref 0.7–4.0)
MCH: 29.9 pg (ref 26.0–34.0)
MCHC: 34.7 g/dL (ref 30.0–36.0)
MCV: 86 fL (ref 78.0–100.0)
Monocytes Absolute: 0.9 10*3/uL (ref 0.1–1.0)
Monocytes Relative: 6 %
Neutro Abs: 12.2 10*3/uL — ABNORMAL HIGH (ref 1.7–7.7)
Neutrophils Relative %: 77 %
Platelets: 501 10*3/uL — ABNORMAL HIGH (ref 150–400)
RBC: 2.78 MIL/uL — ABNORMAL LOW (ref 4.22–5.81)
RDW: 19.7 % — ABNORMAL HIGH (ref 11.5–15.5)
WBC: 15.7 10*3/uL — ABNORMAL HIGH (ref 4.0–10.5)

## 2017-04-05 LAB — RETICULOCYTES
RBC.: 2.78 MIL/uL — ABNORMAL LOW (ref 4.22–5.81)
Retic Count, Absolute: 189 10*3/uL — ABNORMAL HIGH (ref 19.0–186.0)
Retic Ct Pct: 6.8 % — ABNORMAL HIGH (ref 0.4–3.1)

## 2017-04-05 LAB — CREATININE, SERUM
Creatinine, Ser: 0.56 mg/dL — ABNORMAL LOW (ref 0.61–1.24)
GFR calc Af Amer: >60 mL/min (ref >60)
GFR calc non Af Amer: >60 mL/min (ref >60)

## 2017-04-05 MED ORDER — HYDROMORPHONE 1 MG/ML IV SOLN: INTRAVENOUS | Status: DC

## 2017-04-05 MED ORDER — IBUPROFEN 800 MG PO TABS: 800.0000 mg | ORAL_TABLET | Freq: Four times a day (QID) | ORAL | 0 refills | Status: DC

## 2017-04-05 MED ORDER — POTASSIUM CHLORIDE CRYS ER 20 MEQ PO TBCR: 40.0000 meq | EXTENDED_RELEASE_TABLET | Freq: Every day | ORAL | 0 refills | Status: DC

## 2017-04-05 MED ORDER — GUAIFENESIN-DM 100-10 MG/5ML PO SYRP
5.0000 mL | ORAL_SOLUTION | ORAL | Status: DC | PRN
  Administered 2017-04-05: 5 mL via ORAL
  Filled 2017-04-05: qty 10

## 2017-04-05 NOTE — Discharge Summary (Signed)
Timothy Marks MRN: [id number] DOB/AGE: 1990-07-28 27 y.o.  Admit date: 03/29/2017 Discharge date: 04/05/2017  Primary Care Physician:  Timothy Hey, MD   Discharge Diagnoses:   Patient Active Problem List   Diagnosis Date Noted  . 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 Adjustment disorder with disturbance of conduct   . Constipation 08/03/2014  . 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 Protein-calorie malnutrition, severe ([place]) 04/09/2014  . Major depressive disorder, recurrent, severe without psychotic features ([place])   . Vitamin D deficiency   . Sickle cell anemia ([place]) 03/30/2014  . Hyperbilirubinemia 03/30/2014  . Aggressive behavior 01/25/2013    DISCHARGE MEDICATION: Allergies as of 04/05/2017      Reactions   Levaquin [levofloxacin] Itching   Morphine And Related Hives   Toradol [ketorolac Tromethamine] Itching   Tramadol Hives   Vancomycin Itching   Zosyn [piperacillin Sod-tazobactam So] Itching, Rash   Has taken rocephin in past      Medication List    TAKE these medications   cholecalciferol 1000 units tablet Commonly known as:  VITAMIN D Take 2 tablets (2,000 Units total) by mouth daily.   folic acid 1 MG tablet Commonly known as:  FOLVITE Take 1 mg by mouth daily.   hydroxyurea 500 MG capsule Commonly known as:  HYDREA Take 500 mg by mouth 2 (two) times daily. May take with food to minimize GI side effects.   ibuprofen 800 MG tablet Commonly known as:  ADVIL,MOTRIN Take 1 tablet (800 mg total) by mouth 4 (four) times daily.   oxyCODONE-acetaminophen 10-325 MG tablet Commonly known as:  PERCOCET Take 1 tablet by mouth every 6 (six) hours as needed for pain.   potassium chloride SA 20 MEQ tablet Commonly known as:  K-DUR,KLOR-CON Take 2 tablets (40 mEq total) by mouth daily. Start taking on:  04/06/2017         Consults: Treatment Team:  Timothy Reach, MD   SIGNIFICANT DIAGNOSTIC STUDIES:   Dg Chest 2 View  Result Date: 03/29/2017 CLINICAL DATA:  Sickle cell disease with pain EXAM: CHEST  2 VIEW COMPARISON:  January 01, 2017 FINDINGS: Lungs are clear. Heart size and pulmonary vascularity are normal. No adenopathy. 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 Somewhat generalized bony sclerosis is consistent with known sickle cell disease. IMPRESSION: Bony changes indicative of sickle cell disease. Note that there are multiple thoracic spine endplate infarcts. No edema or consolidation. Heart size normal. No evident adenopathy. Electronically Signed   By: Lowella Grip III M.D.   On: 03/29/2017 14:34       No results found for this or any previous visit (from the past 240 hour(s)).  BRIEF ADMITTING H & P: Timothy Marks is a 27 y.o. male with medical history significant of sickle cell anemia, depression, VTE, presenting with sx c/w sickle cell pain crises.   He was recently admitted yesterday for the same, but left AGAINST MEDICAL ADVICE. He notes that his symptoms started yesterday.  He describes the pain is starting in his back first and now located in his chest as well and spread to his arms and legs.  His typical sickle cell pain in his is in his lower back.  He will occasionally have chest pain as well.  He describes the pain as dull.  Nothing is improved the pain.  Movement makes it worse.  He denies any sick contacts.  He is eating and drinking okay.  He denies  any shortness of breath, sore throat, cough, fevers, nausea, vomiting, diarrhea, numbness, tingling, weakness.       Hospital Course:  Present on Admission: . (Resolved) Sickle cell pain crisis ([place]) . (Resolved) Leukocytosis . (Resolved) Hb-SS disease with crisis ([place]) . Constipation  Mr. Timothy Marks is a opiate tolerant patient with hemoglobin SS who was admitted with sickle cell crisis.  His pain was treated with Dilaudid via the PCA, ibuprofen and IV fluids.  He was unable to receive Toradol due to  his allergies associated with the Toradol.  On a reasonable bolus dose of the PCA the patient still complained poor control of his pain.  Additionally the patient was initially sleeping for long periods of time due to this crisis and thus basal dose was added to his regimen.  However this was discontinued as the patient started to have hallucinations.  As the pain improved he was transitioned to oral medication-Percocet.  He is discharged home on his prehospital regiment of oxycodone and ibuprofen for pain.  Patient is to pick up his medications from his primary prescriber Dr. Alyson Marks within the next several days.  As noted above the patient did have what was thought to be visual hallucinations.  Psychiatry was consulted to see the patient.  She felt the patient was actually having an adjustment disorder.  The patient declined any psychotropic medications and she had no further recommendations.  Anemia of chronic disease the patient's hemoglobin remained stable throughout hospitalization and he was continued on hydroxyurea.  The patient also has chronic pain syndrome and takes Percocet 10-325 mg around-the-clock.  The patient freely admits that he takes his medication and quantities more than prescribed and usually runs out of his medication before it is again due.  He manages the situation by using ibuprofen or obtaining medications from friends and other sources.  I have made Dr. Alyson Marks aware of this and my recommendation is that this patient should be on medication-assisted therapy.  The patient is protein calorie malnourished and has been very concerned about a chronic weight loss over several years.  He is tall in stature appears almost emaciated in terms of body mass.  He was seen in consultation by nutritional services who recommended Ensure and live 3 times a day along with his meals.  The patient has very poor dentition and dental caries and was concerned that he may have some type of gum  infection.  The patient had no evidence of an oral infectious process.  However I do recommend that he follows up with his dentist as an outpatient for teeth cleaning and further dental care.  He was Peridex mouthwash during hospitalization and at discharge.  This patient also demonstrated very maladaptive medical behaviors during hospitalization.  He was often not adherent to the monitoring device for the PCA and uncooperative with nursing staff.  He would benefit from psychotherapy and probably psychotropic medications however he declines at this time.   Disposition and Follow-up: The patient is discharged in stable condition.  He is to follow-up with his primary care physician as scheduled.  No prescriptions issued at the time of discharge. Discharge Instructions    Activity as tolerated - No restrictions   Complete by:  As directed    Diet general   Complete by:  As directed       DISCHARGE EXAM:  General: Alert, awake, oriented x 3.  He is in no apparent distress and ambulating without difficulty.HEENT: [address]/AT PEERL, EOMI, anicteric Neck: Trachea midline, no  masses, no thyromegal,y no JVD, no carotid bruit OROPHARYNX: Moist, No exudate/ erythema/lesions.  Heart: Regular rate and rhythm.  There is a II/VI systolic ejection murmur at the base that is nonradiating.  He is without rubs, gallops or S3. PMI non-displaced. Exam reveals no decreased pulses. Pulmonary/Chest: Normal effort. Breath sounds normal. No. Apnea. Clear to auscultation,no stridor,  no wheezing and no rhonchi noted. No respiratory distress and no tenderness noted. Abdomen: Soft, nontender, nondistended, normal bowel sounds, no masses no hepatosplenomegaly noted. No fluid wave and no ascites. There is no guarding or rebound. Neuro: Alert and oriented to person, place and time. Normal motor skills, Displays no atrophy or tremors and exhibits normal muscle tone.  No focal neurological deficits noted cranial nerves II through  XII grossly intact. No sensory deficit noted.  Strength at baseline in bilateral upper and lower extremities. Gait normal. Musculoskeletal: No warmth swelling or erythema around joints, no spinal tenderness noted. Psychiatric: Patient alert and oriented x3, good insight and cognition, good recent to remote recall. Lymph node survey: No cervical axillary or inguinal lymphadenopathy noted. Skin: Skin is warm and dry. No bruising, no ecchymosis and no rash noted. Pt is not diaphoretic. No erythema. No pallor  Blood pressure (!) 148/85, pulse (!) 101, temperature 98.2 F (36.8 C), temperature source Oral, resp. rate 12, height 6\' 2"  (1.88 m), weight 53.4 kg (117 lb 11.6 oz), SpO2 96 %.  Recent Labs    04/03/17 0805 04/05/17 0724  NA 135 139  K 3.4* 4.5  CL 101 100*  CO2 25 30  GLUCOSE 111* 77  BUN 5* 10  CREATININE 0.54* 0.63  0.56*  CALCIUM 8.6* 9.8  MG 1.7  --    Recent Labs    04/03/17 0805  AST 29  ALT 17  ALKPHOS 64  BILITOT 2.4*  PROT 7.2  ALBUMIN 3.5   No results for input(s): LIPASE, AMYLASE in the last 72 hours. Recent Labs    04/03/17 0805 04/05/17 0724  WBC 10.5 15.7*  NEUTROABS 7.4 12.2*  HGB 7.7* 8.3*  HCT 21.5* 23.9*  MCV 84.6 86.0  PLT 371 501*     Total time spent including face to face and decision making was greater than 30 minutes  Signed: MATTHEWS,MICHELLE A. 04/05/2017, 4:28 PM

## 2017-04-05 NOTE — Progress Notes (Signed)
Initial Nutrition Assessment  DOCUMENTATION CODES:   Underweight  INTERVENTION:   Continue Ensure Enlive po TID, each supplement provides 350 kcal and 20 grams of protein  NUTRITION DIAGNOSIS:   Inadequate oral intake related to decreased appetite, other (see comment)(pain) as evidenced by meal completion < 50%, estimated needs.  GOAL:   Patient will meet greater than or equal to 90% of their needs  MONITOR:   PO intake, Supplement acceptance, Labs, Weight trends, I & O's  REASON FOR ASSESSMENT:   Consult Assessment of nutrition requirement/status  ASSESSMENT:   Pt with PMH of sickle cell anemia, VTE, depression  presents with sickle cell pain crisis found adjustment disorder with disturbance of conduct   Discussed pt with RN. Pt not appropriate for RD to see at this time given mental status. RN states pt consuming vanilla Ensure.  Per chart, pt intake fluctuating, consuming between 0-100% of meals at this time. Average intake is ~30% complete.  Per chart, pt with no BM in 7 days.  Weight records over past year are limited.  Suspect pt has some degree of malnutrition, however, unable to identify at this time.   Labs reviewed; Hemoglobin 8.3 Medications reviewed; vitamin D, folic acid, Miralax, potassium chloride, Senokot-S  NUTRITION - FOCUSED PHYSICAL EXAM:  Unable to assess at this time  Diet Order:  Fall precautions Diet regular Room service appropriate? Yes; Fluid consistency: Thin  EDUCATION NEEDS:   Not appropriate for education at this time  Skin:  Skin Assessment: Reviewed RN Assessment  Last BM:  03/29/17  Height:   Ht Readings from Last 1 Encounters:  03/29/17 6\' 2"  (1.88 m)   Weight:   Wt Readings from Last 1 Encounters:  04/05/17 117 lb 11.6 oz (53.4 kg)   Ideal Body Weight:  86.4 kg  BMI:  Body mass index is 15.12 kg/m.  Estimated Nutritional Needs:   Kcal:  1600-1800  Protein:  80-90 grams  Fluid:  >/= 1.6 L/d  Parks Ranger, MS, RDN, LDN 04/05/2017 12:46 PM

## 2017-04-05 NOTE — Progress Notes (Signed)
Patient was very uncooperative and verbally abusive to this nurse during this shift. He constantly refused to keep his O2 and CO2 sensors on and would get really upset when this nurse reminds him of why they should be kept. He would also get upset when this nurse wants to do scheduled checks on his PCA or clear the alarm stating that " I got it, move away" nurse would remind him that operating the pump is not huis responsibility but hers. AC was frequently updated about patient's behaviors and loud disruptive phone calls. AC did come up to speak with patient.

## 2017-04-05 NOTE — Progress Notes (Signed)
Patient was very uncooperative and verbally abusive to this nurse most of the shift. He would not put his O2 and CO2 sensors on and when reminded to do so he would get upset and use profanity.

## 2017-04-18 NOTE — Discharge Summary (Signed)
Pt admitted via [facility] for Sickle Cell Crisis. Treatment was initiated with Dilaudid PCA and IVF. He was transferred to [facility] hospital where services for Patients with SCD is consolidated. He eloped from the nursing and left AMA.   Morrill Bomkamp A.

## 2017-04-26 ENCOUNTER — Ambulatory Visit: Payer: Self-pay | Admitting: Family Medicine

## 2017-05-01 ENCOUNTER — Encounter: Payer: Self-pay | Admitting: Family Medicine

## 2017-05-01 ENCOUNTER — Ambulatory Visit (INDEPENDENT_AMBULATORY_CARE_PROVIDER_SITE_OTHER): Payer: Medicaid Other | Admitting: Family Medicine

## 2017-05-01 VITALS — BP 127/89 | HR 61 | Temp 97.8°F | Resp 14 | Ht 74.0 in | Wt 128.0 lb

## 2017-05-01 DIAGNOSIS — D571 Sickle-cell disease without crisis: Secondary | ICD-10-CM

## 2017-05-01 DIAGNOSIS — Z79891 Long term (current) use of opiate analgesic: Secondary | ICD-10-CM | POA: Diagnosis not present

## 2017-05-01 DIAGNOSIS — R82998 Other abnormal findings in urine: Secondary | ICD-10-CM

## 2017-05-01 DIAGNOSIS — E559 Vitamin D deficiency, unspecified: Secondary | ICD-10-CM | POA: Diagnosis not present

## 2017-05-01 LAB — POCT URINALYSIS DIP (DEVICE)
Glucose, UA: NEGATIVE mg/dL
Ketones, ur: NEGATIVE mg/dL
Nitrite: NEGATIVE
Protein, ur: 30 mg/dL — AB
Specific Gravity, Urine: 1.02 (ref 1.005–1.030)
Urobilinogen, UA: 2 mg/dL — ABNORMAL HIGH (ref 0.0–1.0)
pH: 6.5 (ref 5.0–8.0)

## 2017-05-01 MED ORDER — OXYCODONE-ACETAMINOPHEN 10-325 MG PO TABS: 1.0000 | ORAL_TABLET | Freq: Four times a day (QID) | ORAL | 0 refills | Status: DC | PRN

## 2017-05-01 MED ORDER — HYDROXYUREA 500 MG PO CAPS: 500.0000 mg | ORAL_CAPSULE | Freq: Two times a day (BID) | ORAL | 2 refills | Status: DC

## 2017-05-01 MED ORDER — FOLIC ACID 1 MG PO TABS: 1.0000 mg | ORAL_TABLET | Freq: Every day | ORAL | 2 refills | Status: DC

## 2017-05-01 MED ORDER — IBUPROFEN 800 MG PO TABS: 800.0000 mg | ORAL_TABLET | Freq: Three times a day (TID) | ORAL | 0 refills | Status: DC

## 2017-05-01 NOTE — Progress Notes (Signed)
Subjective:    Patient ID: Timothy Marks, male    DOB: 25-Sep-1990, 27 y.o.   MRN: [id number]  HPI Angie Hogg, a 27 year old male with a history of sickle cell anemia, HbSS presents to establish care. He was a patient of Ricke Hey and was last seen 2 months ago. He is complaining of pain primarily to lower back. He typically takes Oxycodone 10-325 mg every 6 hours and Ibuprofen every 8 hours as needed for chronic pain related to sickle cell anemia. Current pain intensity is 6/10 characterized as intermittent and throbbing.  Patient takes folic acid and hydroxyurea consisently. He denies depression, fatigue, shortness of breath, chest pain, dysuria, nausea, vomiting, or diarrhea. Patient is also not followed by hematology.   Past Medical History:  Diagnosis Date  . Depression   . 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 Sickle cell anemia (HCC)   . Vitamin D deficiency y-6   Social History   Socioeconomic History  . Marital status: Single    Spouse name: Not on file  . Number of children: Not on file  . Years of education: Not on file  . Highest education level: Not on file  Social Needs  . Financial resource strain: Not on file  . Food insecurity - worry: Not on file  . Food insecurity - inability: Not on file  . Transportation needs - medical: Not on file  . Transportation needs - non-medical: Not on file  Occupational History  . Not on file  Tobacco Use  . Smoking status: Never Smoker  . Smokeless tobacco: Never Used  Substance and Sexual Activity  . Alcohol use: No  . Drug use: No    Comment: denies use 05/01/2017  . Sexual activity: Not on file  Other Topics Concern  . Not on file  Social History Narrative  . Not on file   Immunization History  Administered Date(s) Administered  . Influenza,inj,Quad PF,6+ Mos 03/04/2013, 04/01/2014  . Pneumococcal Polysaccharide-23 03/04/2013   Review of Systems   Constitutional: Negative.   HENT: Negative.  Negative for dental problem and drooling.   Eyes: Negative.   Respiratory: Negative.   Cardiovascular: Negative.   Gastrointestinal: Negative.   Endocrine: Negative for polydipsia, polyphagia and polyuria.  Genitourinary: Negative.   Musculoskeletal: Positive for arthralgias and back pain.  Skin: Negative.   Allergic/Immunologic: Negative for immunocompromised state.  Neurological: Negative.   Hematological: Negative.   Psychiatric/Behavioral: Negative.       Objective:   Physical Exam  HENT:  Head: Normocephalic and atraumatic.  Right Ear: External ear normal.  Left Ear: External ear normal.  Nose: Nose normal.  Mouth/Throat: Oropharynx is clear and moist.  Eyes: Conjunctivae are normal. Pupils are equal, round, and reactive to light.  Neck: Normal range of motion. Neck supple.  Pulmonary/Chest: Effort normal and breath sounds normal.  Abdominal: Soft. Bowel sounds are normal.  Neurological: He is alert.  Skin: Skin is warm and dry.  Psychiatric: He has a normal mood and affect. His behavior is normal. Judgment and thought content normal.      BP 127/89 (BP Location: Left Arm, Patient Position: Sitting, Cuff Size: Normal)   Pulse 61   Temp 97.8 F (36.6 C) (Oral)   Resp 14   Ht 6\' 2"  (1.88 m)   Wt 128 lb (58.1 kg)   SpO2 100%   BMI 16.43 kg/m  Assessment & Plan:  1. Hb-SS  disease without crisis (HCC) Continue Hydrea 1000 mg daily. Will check CBC for absolute neutrophil count and platelets. Will also check reticulocyte count. Will consider increasing dosage. We discussed the need for good hydration, monitoring of hydration status, avoidance of heat, cold, stress, and infection triggers. We discussed the risks and benefits of Hydrea, including bone marrow suppression, the possibility of GI upset, skin ulcers, hair thinning, and teratogenicity. Reminded Mr. Linford of the needed to use contraception while on hydroxyurea due to  teratogenicity. The patient was reminded of the need to seek medical attention of any symptoms of bleeding, anemia, or infection. Will start folic acid 1 mg daily to prevent aplastic bone marrow crises.   Pulmonary evaluation - Patient denies severe recurrent wheezes, shortness of breath with exercise, or persistent cough. If these symptoms develop, pulmonary function tests with spirometry will be ordered, and if abnormal, plan on referral to Pulmonology for further evaluation.  Cardiac - Routine screening for pulmonary hypertension is not recommended.  Eye - High risk of proliferative retinopathy. Annual eye exam with retinal exam recommended to patient. Last eye examination 6 months ago  Acute and chronic painful episodes - We agreed on 60 pills per month. Pt states that she primarily utilizes Ibuprofen for mild pain related to sickle cell anemia. We discussed that pt is to receive her Schedule II prescriptions only from [place]. Pt is also aware that the prescription history is available to [place] online through the [facility] CSRS. Controlled substance agreement signed 11/18/2014. We reminded Ms. Nevada Crane that all patients receiving Schedule II narcotics must be seen for follow within one month of prescription being requested. We reviewed the terms of our pain agreement, including the need to keep medicines in a safe locked location away from children or pets, and the need to report excess sedation or constipation, measures to avoid constipation, and policies related to early refills and stolen prescriptions. According to the [place] [facility] program, we have reviewed details related to analgesia, adverse effects, aberrant behaviors. Reviewed [place] Substance Reporting system prior to prescribing opiate medication, no inconsistencies noted.    - Comprehensive metabolic panel - Ferritin - CBC with Differential - Reticulocytes - EKG 12-Lead - hydroxyurea (HYDREA) 500 MG capsule; Take 1 capsule (500 mg total) by  mouth 2 (two) times daily. May take with food to minimize GI side effects.  Dispense: 180 capsule; Refill: 2 - folic acid (FOLVITE) 1 MG tablet; Take 1 tablet (1 mg total) by mouth daily.  Dispense: 90 tablet; Refill: 2 - ibuprofen (ADVIL,MOTRIN) 800 MG tablet; Take 1 tablet (800 mg total) by mouth 3 (three) times daily.  Dispense: 30 tablet; Refill: 0 - oxyCODONE-acetaminophen (PERCOCET) 10-325 MG tablet; Take 1 tablet by mouth every 6 (six) hours as needed for pain.  Dispense: 60 tablet; Refill: 0 - Ambulatory referral to Hematology - Ambulatory referral to Ophthalmology  2. Vitamin D deficiency - Vitamin D, 25-hydroxy  3. Chronic prescription opiate use  Medication and dose: Oxycodone-acetaminophen 10-325 mg every 6 hours as needed for moderate to severe pain.  # pills per month: #60 Last UDS date: 05/01/2017  Pain contract signed (Y/N): yes Date narcotic database last reviewed (include red flags): 05/01/2017 - ToxASSURE Select 13 (MW), Urine - oxyCODONE-acetaminophen (PERCOCET) 10-325 MG tablet; Take 1 tablet by mouth every 6 (six) hours as needed for pain.  Dispense: 60 tablet; Refill: 0   RTC: 1 month for DMII   Alyan Hartline Al Decant  MSN, FNP-C [facility] [place], [place] [address] [phone]

## 2017-05-01 NOTE — Addendum Note (Signed)
Addended by: Dorena Dew on: 05/01/2017 04:15 PM   Modules accepted: Orders

## 2017-05-01 NOTE — Patient Instructions (Addendum)
You signed pain agreement with clinic today.  Will continue Percocet 10-325 mg every 6 hours as needed for moderate to severe pain.  We discussed prescription pickup policies.  Also to request prescriptions for opiates, you will call [phone].  Prescription pickup days are on Tuesdays and Fridays.  I will follow-up in 1 month for medication management. We will follow-up by phone with any abnormal laboratory results.  All of your prescriptions have been sent electronically.  Recommend that you continue to hydrate with 64 ounces of water daily.  Also maintain a balanced diet.  Recommend folic acid 1 mg daily to prevent vaso-occlusive crisis.  Also continue hydroxyurea as prescribed.  We discussed day hospital policies at length.  The day hospital is available Monday through Friday from 8 AM to 4:30 PM.  We did not except patients after 12.     Sickle Cell Anemia, Adult Sickle cell anemia is a condition where your red blood cells are shaped like sickles. Red blood cells carry oxygen through the body. Sickle-shaped red blood cells do not live as long as normal red blood cells. They also clump together and block blood from flowing through the blood vessels. These things prevent the body from getting enough oxygen. Sickle cell anemia causes organ damage and pain. It also increases the risk of infection. Follow these instructions at home:  Drink enough fluid to keep your pee (urine) clear or pale yellow. Drink more in hot weather and during exercise.  Do not smoke. Smoking lowers oxygen levels in the blood.  Only take over-the-counter or prescription medicines as told by your doctor.  Take antibiotic medicines as told by your doctor. Make sure you finish them even if you start to feel better.  Take supplements as told by your doctor.  Consider wearing a medical alert bracelet. This tells anyone caring for you in an emergency of your condition.  When traveling, keep your medical information,  doctors' names, and the medicines you take with you at all times.  If you have a fever, do not take fever medicines right away. This could cover up a problem. Tell your doctor.  Keep all follow-up visits with your doctor. Sickle cell anemia requires regular medical care. Contact a doctor if: You have a fever. Get help right away if:  You feel dizzy or faint.  You have new belly (abdominal) pain, especially on the left side near the stomach area.  You have a lasting, often uncomfortable and painful erection of the penis (priapism). If it is not treated right away, you will become unable to have sex (impotence).  You have numbness in your arms or legs or you have a hard time moving them.  You have a hard time talking.  You have a fever or lasting symptoms for more than 2-3 days.  You have a fever and your symptoms suddenly get worse.  You have signs or symptoms of infection. These include: ? Chills. ? Being more tired than normal (lethargy). ? Irritability. ? Poor eating. ? Throwing up (vomiting).  You have pain that is not helped with medicine.  You have shortness of breath.  You have pain in your chest.  You are coughing up pus-like or bloody mucus.  You have a stiff neck.  Your feet or hands swell or have pain.  Your belly looks bloated.  Your joints hurt. This information is not intended to replace advice given to you by your health care provider. Make sure you discuss any questions you  have with your health care provider. Document Released: 11/21/2012 Document Revised: 07/09/2015 Document Reviewed: 09/12/2012 Elsevier Interactive Patient Education  2017 [employer].

## 2017-05-02 LAB — CBC WITH DIFFERENTIAL/PLATELET
Basophils Absolute: 0 10*3/uL (ref 0.0–0.2)
Basos: 0 %
EOS (ABSOLUTE): 0 10*3/uL (ref 0.0–0.4)
Eos: 0 %
Hematocrit: 43.2 % (ref 37.5–51.0)
Hemoglobin: 13.4 g/dL (ref 13.0–17.7)
Immature Grans (Abs): 0 10*3/uL (ref 0.0–0.1)
Immature Granulocytes: 0 %
Lymphocytes Absolute: 1.5 10*3/uL (ref 0.7–3.1)
Lymphs: 21 %
MCH: 27.4 pg (ref 26.6–33.0)
MCHC: 31 g/dL — ABNORMAL LOW (ref 31.5–35.7)
MCV: 88 fL (ref 79–97)
Monocytes Absolute: 0.5 10*3/uL (ref 0.1–0.9)
Monocytes: 7 %
Neutrophils Absolute: 5.2 10*3/uL (ref 1.4–7.0)
Neutrophils: 72 %
Platelets: 360 10*3/uL (ref 150–379)
RBC: 4.89 x10E6/uL (ref 4.14–5.80)
RDW: 18.6 % — ABNORMAL HIGH (ref 12.3–15.4)
WBC: 7.3 10*3/uL (ref 3.4–10.8)

## 2017-05-02 LAB — COMPREHENSIVE METABOLIC PANEL
ALT: 11 IU/L (ref 0–44)
AST: 35 IU/L (ref 0–40)
Albumin/Globulin Ratio: 1.5 (ref 1.2–2.2)
Albumin: 4.9 g/dL (ref 3.5–5.5)
Alkaline Phosphatase: 98 IU/L (ref 39–117)
BUN/Creatinine Ratio: 6 — ABNORMAL LOW (ref 9–20)
BUN: 4 mg/dL — ABNORMAL LOW (ref 6–20)
Bilirubin Total: 1.4 mg/dL — ABNORMAL HIGH (ref 0.0–1.2)
CO2: 18 mmol/L — ABNORMAL LOW (ref 20–29)
Calcium: 9.6 mg/dL (ref 8.7–10.2)
Chloride: 102 mmol/L (ref 96–106)
Creatinine, Ser: 0.69 mg/dL — ABNORMAL LOW (ref 0.76–1.27)
GFR calc Af Amer: 149 mL/min/{1.73_m2} (ref >59)
GFR calc non Af Amer: 129 mL/min/{1.73_m2} (ref >59)
Globulin, Total: 3.3 g/dL (ref 1.5–4.5)
Glucose: 78 mg/dL (ref 65–99)
Potassium: 4.3 mmol/L (ref 3.5–5.2)
Sodium: 142 mmol/L (ref 134–144)
Total Protein: 8.2 g/dL (ref 6.0–8.5)

## 2017-05-02 LAB — FERRITIN: Ferritin: 1603 ng/mL — ABNORMAL HIGH (ref 30–400)

## 2017-05-02 LAB — VITAMIN D 25 HYDROXY (VIT D DEFICIENCY, FRACTURES): Vit D, 25-Hydroxy: 17.5 ng/mL — ABNORMAL LOW (ref 30.0–100.0)

## 2017-05-02 LAB — RETICULOCYTES: Retic Ct Pct: 3.7 % — ABNORMAL HIGH (ref 0.6–2.6)

## 2017-05-03 ENCOUNTER — Other Ambulatory Visit: Payer: Self-pay

## 2017-05-03 ENCOUNTER — Telehealth: Payer: Self-pay

## 2017-05-03 MED ORDER — VITAMIN D (ERGOCALCIFEROL) 1.25 MG (50000 UNIT) PO CAPS: 50000.0000 [IU] | ORAL_CAPSULE | ORAL | 12 refills | Status: DC

## 2017-05-03 NOTE — Telephone Encounter (Signed)
-----   Message from Dorena Dew, [place] sent at 05/02/2017  7:01 PM EDT ----- Regarding: lab results Please inform patient that vitamin D level is decreased, which is consistent with a vitamin D deficiency.  Will start Drisdol 50,000 units weekly.  Will recheck levels in 12 weeks. Also, ferritin level is increased which is indicative of iron overload.  I have sent a referral to hematology, I will defer to hematology for further workup and evaluation. Follow-up in office as scheduled.  [name]  MSN, FNP-C [facility] [address] [place], [place] [address] [phone]

## 2017-05-03 NOTE — Telephone Encounter (Signed)
SPOKE WITH PATIENT, ADVISED THAT VITAMIN d LEVEL IS decreased, which is consistent with vitamin D deficiency and we will start once weekly vitamin D for 12 weeks and then recheck labs. Also advised that ferritin level is increased which is indicative of iron overload and that we have referred to hematology for further workup and evaluation. Thanks!

## 2017-05-04 LAB — URINE CULTURE: Organism ID, Bacteria: NO GROWTH

## 2017-05-04 LAB — SPECIMEN STATUS REPORT

## 2017-05-05 LAB — TOXASSURE SELECT 13 (MW), URINE

## 2017-05-10 ENCOUNTER — Telehealth: Payer: Self-pay

## 2017-05-12 ENCOUNTER — Other Ambulatory Visit: Payer: Self-pay | Admitting: Family Medicine

## 2017-05-12 ENCOUNTER — Other Ambulatory Visit: Payer: Medicaid Other

## 2017-05-12 DIAGNOSIS — D571 Sickle-cell disease without crisis: Secondary | ICD-10-CM

## 2017-05-12 DIAGNOSIS — Z79891 Long term (current) use of opiate analgesic: Secondary | ICD-10-CM

## 2017-05-12 MED ORDER — OXYCODONE-ACETAMINOPHEN 10-325 MG PO TABS
1.0000 | ORAL_TABLET | Freq: Four times a day (QID) | ORAL | 0 refills | Status: DC | PRN
Start: 2017-05-12 — End: 2017-05-30

## 2017-05-12 NOTE — Progress Notes (Signed)
Reviewed [place] Substance Reporting system prior to prescribing opiate medications. No inconsistencies noted.   Indication for chronic opioid: Chronic pain related to sickle cell anemia Medication and dose:  # pills per month: 60 Last UDS date: 05/01/2017 Opioid Treatment Agreement signed (Y/N): 05/01/2017 [facility]SRS reviewed this encounter (include red flags): Yes   Meds ordered this encounter  Medications  . oxyCODONE-acetaminophen (PERCOCET) 10-325 MG tablet    Sig: Take 1 tablet by mouth every 6 (six) hours as needed for pain.    Dispense:  60 tablet    Refill:  0    Rx not to be filled prior to 05/16/2017    Order Specific Question:   Supervising Provider    Answer:   Tresa Garter [[id number]]    [name]  MSN, FNP-C [facility] [address] [place], [place] [address] [phone]

## 2017-05-13 LAB — MED LIST OPTION NOT SELECTED

## 2017-05-15 NOTE — Progress Notes (Deleted)
[facility] Note  05/16/2017     CHIEF COMPLAINT Patient presents for No chief complaint on file.   HISTORY OF PRESENT ILLNESS: Timothy Marks is a 27 y.o. male who presents to the clinic today for:     Referring physician: Monna Fam, [place], [place] [address]  HISTORICAL INFORMATION:   Selected notes from the MEDICAL RECORD NUMBER Referred by Dr. 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 (Ophthalmic Drugs)   No current facility-administered medications for this visit.  (Ophthalmic Drugs)   Current Outpatient Medications (Other)  Medication Sig  . cholecalciferol (VITAMIN D) 1000 UNITS tablet Take 2 tablets (2,000 Units total) by mouth daily.  . folic acid (FOLVITE) 1 MG tablet Take 1 tablet (1 mg total) by mouth daily.  . hydroxyurea (HYDREA) 500 MG capsule Take 1 capsule (500 mg total) by mouth 2 (two) times daily. May take with food to minimize GI side effects.  Marland Kitchen ibuprofen (ADVIL,MOTRIN) 800 MG tablet Take 1 tablet (800 mg total) by mouth 3 (three) times daily.  Marland Kitchen oxyCODONE-acetaminophen (PERCOCET) 10-325 MG tablet Take 1 tablet by mouth every 6 (six) hours as needed for pain.  . potassium chloride SA (K-DUR,KLOR-CON) 20 MEQ tablet Take 2 tablets (40 mEq total) by mouth daily.  . Vitamin D, Ergocalciferol, (DRISDOL) 50000 units CAPS capsule Take 1 capsule (50,000 Units total) by mouth every 7 (seven) days.   No current facility-administered medications for this visit.  (Other)      REVIEW OF SYSTEMS:    ALLERGIES Allergies  Allergen Reactions  . Levaquin [Levofloxacin] Itching  . Morphine And Related Hives  . Toradol [Ketorolac Tromethamine] Itching  . Tramadol Hives  . Vancomycin Itching  . Zosyn [Piperacillin  Sod-Tazobactam So] Itching and Rash    Has taken rocephin in past    PAST MEDICAL HISTORY Past Medical History:  Diagnosis Date  . Depression   . 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 Sickle cell anemia (HCC)   . Vitamin D deficiency y-6   Past Surgical History:  Procedure Laterality Date  . CHOLECYSTECTOMY      FAMILY HISTORY Family History  Problem Relation Age of Onset  . Sickle cell trait Mother   . Sickle cell trait Father     SOCIAL HISTORY Social History   Tobacco Use  . Smoking status: Never Smoker  . Smokeless tobacco: Never Used  Substance Use Topics  . Alcohol use: No  . Drug use: No    Types: Marijuana    Comment: denies use 05/01/2017         OPHTHALMIC EXAM:   Not recorded      IMAGING AND PROCEDURES  Imaging and Procedures for 05/15/17           ASSESSMENT/PLAN:    ICD-10-CM   1. Retinal edema H35.81 OCT, Retina - OU - Both Eyes    1.  2.  3.  Ophthalmic Meds Ordered this visit:  No orders of the defined types were placed in this encounter.      No follow-ups on file.  There are no Patient Instructions on file for this visit.   Explained the diagnoses, plan, and follow up with the patient and they  expressed understanding.  Patient expressed understanding of the importance of proper follow up care.   This document serves as a record of services personally performed by Gardiner Sleeper, MD, PhD. It was created on their behalf by Catha Brow, [place], a certified ophthalmic assistant. The creation of this record is the provider's dictation and/or activities during the visit.  Electronically signed by: Catha Brow, [place]  05/15/17 5:05 PM    Gardiner Sleeper, M.D., Ph.D. Diseases & Surgery of the Retina and [place] 05/15/17     Abbreviations: M myopia (nearsighted); A astigmatism; H hyperopia (farsighted); P  presbyopia; Mrx spectacle prescription;  CTL contact lenses; OD right eye; OS left eye; OU both eyes  XT exotropia; ET esotropia; PEK punctate epithelial keratitis; PEE punctate epithelial erosions; DES dry eye syndrome; MGD meibomian gland dysfunction; ATs artificial tears; PFAT's preservative free artificial tears; [place] nuclear sclerotic cataract; PSC posterior subcapsular cataract; ERM epi-retinal membrane; PVD posterior vitreous detachment; RD retinal detachment; DM diabetes mellitus; DR diabetic retinopathy; NPDR non-proliferative diabetic retinopathy; PDR proliferative diabetic retinopathy; CSME clinically significant macular edema; DME diabetic macular edema; dbh dot blot hemorrhages; CWS cotton wool spot; POAG primary open angle glaucoma; C/D cup-to-disc ratio; HVF humphrey visual field; GVF goldmann visual field; OCT optical coherence tomography; IOP intraocular pressure; BRVO Branch retinal vein occlusion; CRVO central retinal vein occlusion; CRAO central retinal artery occlusion; BRAO branch retinal artery occlusion; RT retinal tear; SB scleral buckle; PPV pars plana vitrectomy; VH Vitreous hemorrhage; PRP panretinal laser photocoagulation; IVK intravitreal kenalog; VMT vitreomacular traction; MH Macular hole;  NVD neovascularization of the disc; NVE neovascularization elsewhere; AREDS age related eye disease study; ARMD age related macular degeneration; POAG primary open angle glaucoma; EBMD epithelial/anterior basement membrane dystrophy; ACIOL anterior chamber intraocular lens; IOL intraocular lens; PCIOL posterior chamber intraocular lens; Phaco/IOL phacoemulsification with intraocular lens placement; [place] photorefractive keratectomy; LASIK laser assisted in situ keratomileusis; HTN hypertension; DM diabetes mellitus; COPD chronic obstructive pulmonary disease

## 2017-05-16 ENCOUNTER — Encounter (INDEPENDENT_AMBULATORY_CARE_PROVIDER_SITE_OTHER): Payer: Self-pay | Admitting: Ophthalmology

## 2017-05-16 LAB — 737588 9+OXYCODONE+CRT-UNBUND
Amphetamine Scrn, Ur: NEGATIVE ng/mL
BARBITURATE SCREEN URINE: NEGATIVE ng/mL
BENZODIAZEPINE SCREEN, URINE: NEGATIVE ng/mL
Cocaine (Metab) Scrn, Ur: NEGATIVE ng/mL
Creatinine(Crt), U: 92.5 mg/dL (ref 20.0–300.0)
Methadone Screen, Urine: NEGATIVE ng/mL
OXYCODONE+OXYMORPHONE UR QL SCN: NEGATIVE ng/mL
Opiate Scrn, Ur: NEGATIVE ng/mL
Ph of Urine: 7.1 (ref 4.5–8.9)
Phencyclidine Qn, Ur: NEGATIVE ng/mL
Propoxyphene Scrn, Ur: NEGATIVE ng/mL

## 2017-05-16 LAB — CANNABINOID (GC/MS), URINE
Cannabinoid: POSITIVE — AB
Carboxy THC (GC/MS): >300 ng/mL

## 2017-05-18 ENCOUNTER — Other Ambulatory Visit: Payer: Self-pay

## 2017-05-18 ENCOUNTER — Emergency Department (HOSPITAL_COMMUNITY)
Admission: EM | Admit: 2017-05-18 | Discharge: 2017-05-18 | Disposition: A | Discharge status: Home / Self Care | Payer: Medicaid Other | Source: Home / Self Care | Attending: Emergency Medicine | Admitting: Emergency Medicine

## 2017-05-18 ENCOUNTER — Emergency Department (HOSPITAL_COMMUNITY): Payer: Medicaid Other

## 2017-05-18 ENCOUNTER — Encounter (HOSPITAL_COMMUNITY): Payer: Self-pay | Admitting: *Deleted

## 2017-05-18 ENCOUNTER — Emergency Department (HOSPITAL_COMMUNITY)
Admission: EM | Admit: 2017-05-18 | Discharge: 2017-05-18 | Discharge status: Against Medical Advice | Payer: Medicaid Other | Attending: Emergency Medicine | Admitting: Emergency Medicine

## 2017-05-18 ENCOUNTER — Encounter (HOSPITAL_COMMUNITY): Payer: Self-pay | Admitting: Nurse Practitioner

## 2017-05-18 DIAGNOSIS — D5700 Hb-SS disease with crisis, unspecified: Secondary | ICD-10-CM | POA: Insufficient documentation

## 2017-05-18 DIAGNOSIS — N3000 Acute cystitis without hematuria: Secondary | ICD-10-CM | POA: Diagnosis not present

## 2017-05-18 DIAGNOSIS — M79603 Pain in arm, unspecified: Secondary | ICD-10-CM | POA: Diagnosis present

## 2017-05-18 LAB — CBC WITH DIFFERENTIAL/PLATELET
Basophils Absolute: 0 10*3/uL (ref 0.0–0.1)
Basophils Absolute: 0 10*3/uL (ref 0.0–0.1)
Basophils Relative: 0 %
Basophils Relative: 0 %
Eosinophils Absolute: 0.1 10*3/uL (ref 0.0–0.7)
Eosinophils Absolute: 0 10*3/uL (ref 0.0–0.7)
Eosinophils Relative: 1 %
Eosinophils Relative: 0 %
HCT: 26.9 % — ABNORMAL LOW (ref 39.0–52.0)
HCT: 26.8 % — ABNORMAL LOW (ref 39.0–52.0)
Hemoglobin: 9.1 g/dL — ABNORMAL LOW (ref 13.0–17.0)
Hemoglobin: 9 g/dL — ABNORMAL LOW (ref 13.0–17.0)
Lymphocytes Relative: 17 %
Lymphocytes Relative: 10 %
Lymphs Abs: 2 10*3/uL (ref 0.7–4.0)
Lymphs Abs: 2.1 10*3/uL (ref 0.7–4.0)
MCH: 27 pg (ref 26.0–34.0)
MCH: 27.1 pg (ref 26.0–34.0)
MCHC: 33.8 g/dL (ref 30.0–36.0)
MCHC: 33.6 g/dL (ref 30.0–36.0)
MCV: 79.8 fL (ref 78.0–100.0)
MCV: 80.7 fL (ref 78.0–100.0)
Monocytes Absolute: 0.7 10*3/uL (ref 0.1–1.0)
Monocytes Absolute: 1.1 10*3/uL — ABNORMAL HIGH (ref 0.1–1.0)
Monocytes Relative: 6 %
Monocytes Relative: 5 %
Neutro Abs: 8.9 10*3/uL — ABNORMAL HIGH (ref 1.7–7.7)
Neutro Abs: 17.9 10*3/uL — ABNORMAL HIGH (ref 1.7–7.7)
Neutrophils Relative %: 76 %
Neutrophils Relative %: 85 %
Platelets: 485 10*3/uL — ABNORMAL HIGH (ref 150–400)
Platelets: 506 10*3/uL — ABNORMAL HIGH (ref 150–400)
RBC: 3.37 MIL/uL — ABNORMAL LOW (ref 4.22–5.81)
RBC: 3.32 MIL/uL — ABNORMAL LOW (ref 4.22–5.81)
RDW: 22.8 % — ABNORMAL HIGH (ref 11.5–15.5)
RDW: 23 % — ABNORMAL HIGH (ref 11.5–15.5)
WBC: 11.7 10*3/uL — ABNORMAL HIGH (ref 4.0–10.5)
WBC: 21.1 10*3/uL — ABNORMAL HIGH (ref 4.0–10.5)
nRBC: 2 /100 WBC — ABNORMAL HIGH

## 2017-05-18 LAB — URINALYSIS, ROUTINE W REFLEX MICROSCOPIC
Bacteria, UA: NONE SEEN
Bilirubin Urine: NEGATIVE
Glucose, UA: NEGATIVE mg/dL
Ketones, ur: NEGATIVE mg/dL
Nitrite: NEGATIVE
Protein, ur: NEGATIVE mg/dL
Specific Gravity, Urine: 1.008 (ref 1.005–1.030)
pH: 8 (ref 5.0–8.0)

## 2017-05-18 LAB — COMPREHENSIVE METABOLIC PANEL
ALT: 19 U/L (ref 17–63)
ALT: 18 U/L (ref 17–63)
AST: 32 U/L (ref 15–41)
AST: 31 U/L (ref 15–41)
Albumin: 4.1 g/dL (ref 3.5–5.0)
Albumin: 4.1 g/dL (ref 3.5–5.0)
Alkaline Phosphatase: 86 U/L (ref 38–126)
Alkaline Phosphatase: 81 U/L (ref 38–126)
Anion gap: 9 (ref 5–15)
Anion gap: 10 (ref 5–15)
BUN: <5 mg/dL — ABNORMAL LOW (ref 6–20)
BUN: 6 mg/dL (ref 6–20)
CO2: 25 mmol/L (ref 22–32)
CO2: 25 mmol/L (ref 22–32)
Calcium: 9 mg/dL (ref 8.9–10.3)
Calcium: 9 mg/dL (ref 8.9–10.3)
Chloride: 101 mmol/L (ref 101–111)
Chloride: 104 mmol/L (ref 101–111)
Creatinine, Ser: 0.52 mg/dL — ABNORMAL LOW (ref 0.61–1.24)
Creatinine, Ser: 0.54 mg/dL — ABNORMAL LOW (ref 0.61–1.24)
GFR calc Af Amer: >60 mL/min (ref >60)
GFR calc Af Amer: >60 mL/min (ref >60)
GFR calc non Af Amer: >60 mL/min (ref >60)
GFR calc non Af Amer: >60 mL/min (ref >60)
Glucose, Bld: 99 mg/dL (ref 65–99)
Glucose, Bld: 111 mg/dL — ABNORMAL HIGH (ref 65–99)
Potassium: 3.3 mmol/L — ABNORMAL LOW (ref 3.5–5.1)
Potassium: 3.3 mmol/L — ABNORMAL LOW (ref 3.5–5.1)
Sodium: 135 mmol/L (ref 135–145)
Sodium: 139 mmol/L (ref 135–145)
Total Bilirubin: 2.7 mg/dL — ABNORMAL HIGH (ref 0.3–1.2)
Total Bilirubin: 2 mg/dL — ABNORMAL HIGH (ref 0.3–1.2)
Total Protein: 7.6 g/dL (ref 6.5–8.1)
Total Protein: 8.1 g/dL (ref 6.5–8.1)

## 2017-05-18 LAB — RETICULOCYTES
RBC.: 3.37 MIL/uL — ABNORMAL LOW (ref 4.22–5.81)
RBC.: 3.32 MIL/uL — ABNORMAL LOW (ref 4.22–5.81)
Retic Count, Absolute: 390.9 10*3/uL — ABNORMAL HIGH (ref 19.0–186.0)
Retic Count, Absolute: 381.8 10*3/uL — ABNORMAL HIGH (ref 19.0–186.0)
Retic Ct Pct: 11.6 % — ABNORMAL HIGH (ref 0.4–3.1)
Retic Ct Pct: 11.5 % — ABNORMAL HIGH (ref 0.4–3.1)

## 2017-05-18 MED ORDER — DOXYCYCLINE HYCLATE 100 MG PO CAPS: 100.0000 mg | ORAL_CAPSULE | Freq: Two times a day (BID) | ORAL | 0 refills | Status: DC

## 2017-05-18 MED ORDER — HYDROMORPHONE HCL 2 MG/ML IJ SOLN: 2.0000 mg | INTRAMUSCULAR | Status: AC

## 2017-05-18 MED ORDER — DIPHENHYDRAMINE HCL 50 MG/ML IJ SOLN
25.0000 mg | Freq: Once | INTRAMUSCULAR | Status: AC
  Administered 2017-05-18: 25 mg via INTRAVENOUS
  Filled 2017-05-18: qty 1

## 2017-05-18 MED ORDER — ONDANSETRON HCL 4 MG/2ML IJ SOLN
4.0000 mg | INTRAMUSCULAR | Status: DC | PRN
  Administered 2017-05-18: 4 mg via INTRAVENOUS
  Filled 2017-05-18: qty 2

## 2017-05-18 MED ORDER — HYDROMORPHONE HCL 2 MG/ML IJ SOLN
2.0000 mg | INTRAMUSCULAR | Status: AC
  Administered 2017-05-18 (×2): 2 mg via INTRAVENOUS
  Filled 2017-05-18 (×2): qty 1

## 2017-05-18 MED ORDER — SODIUM CHLORIDE 0.9 % IV SOLN
1.0000 g | Freq: Once | INTRAVENOUS | Status: AC
  Administered 2017-05-18: 1 g via INTRAVENOUS
  Filled 2017-05-18: qty 10

## 2017-05-18 MED ORDER — HYDROMORPHONE HCL 2 MG/ML IJ SOLN
2.0000 mg | INTRAMUSCULAR | Status: AC
  Administered 2017-05-18: 2 mg via INTRAVENOUS
  Filled 2017-05-18: qty 1

## 2017-05-18 MED ORDER — HYDROMORPHONE HCL 2 MG/ML IJ SOLN
2.0000 mg | Freq: Once | INTRAMUSCULAR | Status: AC
  Administered 2017-05-18: 2 mg via INTRAVENOUS
  Filled 2017-05-18: qty 1

## 2017-05-18 MED ORDER — SODIUM CHLORIDE 0.45 % IV SOLN
INTRAVENOUS | Status: DC
  Administered 2017-05-18: 15:00:00 via INTRAVENOUS

## 2017-05-18 NOTE — ED Triage Notes (Signed)
Pt in c/o sickle cell pain crisis, states he is hurting all over, denies fever or n/v, denies shortness of breath, no distress noted

## 2017-05-18 NOTE — ED Triage Notes (Signed)
Patient brought in the ER for leg and back pain due to his sickle cell

## 2017-05-18 NOTE — Discharge Instructions (Addendum)
Return if any problems.  See your Physician for recheck  °

## 2017-05-18 NOTE — ED Notes (Signed)
PT asked this RN how much longer the wait was going to be and was informed that he had 1 person in front of him to go back but we were working to get him into a room as soon as possible. Pt states that he does not want to wait any longer and decided to leave. Pt ambulatory out of department.

## 2017-05-18 NOTE — ED Provider Notes (Signed)
[facility] DEPT Provider Note   CSN: [id number] Arrival date & time: 05/18/17  1327     History   Chief Complaint No chief complaint on file.   HPI Timothy Marks is a 27 y.o. male.  The history is provided by the patient. No language interpreter was used.  Sickle Cell Pain Crisis  Location:  Upper extremity, lower extremity, chest and back Severity:  Moderate Onset quality:  Gradual Duration:  1 day Similar to previous crisis episodes: no   Timing:  Constant Chronicity:  New Context: cold exposure   Relieved by:  Nothing Worsened by:  Nothing Associated symptoms: no chest pain, no headaches and no priapism   Pt complains of a sickle cell crisis.  Pt reports this is his normal crisis pain.  Pt denies fever, chest pain  No shortness of breath.    Past Medical History:  Diagnosis Date  . Depression   . 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 Sickle cell anemia (HCC)   . Vitamin D deficiency y-6    Patient Active Problem List   Diagnosis Date Noted  . Adjustment disorder with disturbance of conduct   . Constipation 08/03/2014  . 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 Protein-calorie malnutrition, severe ([place]) 04/09/2014  . Major depressive disorder, recurrent, severe without psychotic features ([place])   . 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 Vitamin D deficiency   . Sickle cell anemia ([place]) 03/30/2014  . Hyperbilirubinemia 03/30/2014  . Aggressive behavior 01/25/2013    Past Surgical History:  Procedure Laterality Date  . CHOLECYSTECTOMY          Home Medications    Prior to Admission medications   Medication Sig Start Date End Date Taking? Authorizing Provider  cholecalciferol (VITAMIN D) 1000 UNITS tablet Take 2 tablets (2,000 Units total) by mouth daily. 04/09/14  Yes Leana Gamer, MD  folic acid (FOLVITE) 1 MG tablet Take 1 tablet (1  mg total) by mouth daily. 05/01/17  Yes Dorena Dew, FNP  hydroxyurea (HYDREA) 500 MG capsule Take 1 capsule (500 mg total) by mouth 2 (two) times daily. May take with food to minimize GI side effects. 05/01/17  Yes Dorena Dew, FNP  ibuprofen (ADVIL,MOTRIN) 800 MG tablet Take 1 tablet (800 mg total) by mouth 3 (three) times daily. 05/01/17  Yes Dorena Dew, FNP  oxyCODONE-acetaminophen (PERCOCET) 10-325 MG tablet Take 1 tablet by mouth every 6 (six) hours as needed for pain. 05/12/17  Yes Dorena Dew, FNP  potassium chloride SA (K-DUR,KLOR-CON) 20 MEQ tablet Take 2 tablets (40 mEq total) by mouth daily. 04/06/17  Yes Leana Gamer, MD  Vitamin D, Ergocalciferol, (DRISDOL) 50000 units CAPS capsule Take 1 capsule (50,000 Units total) by mouth every 7 (seven) days. Patient not taking: Reported on 05/18/2017 05/03/17   Dorena Dew, FNP    Family History Family History  Problem Relation Age of Onset  . Sickle cell trait Mother   . Sickle cell trait Father     Social History Social History   Tobacco Use  . Smoking status: Never Smoker  . Smokeless tobacco: Never Used  Substance Use Topics  . Alcohol use: No  . Drug use: No    Types: Marijuana    Comment: denies use 05/01/2017     Allergies   Levaquin [levofloxacin]; Morphine and related; Toradol [ketorolac tromethamine]; Tramadol; Vancomycin; and Zosyn [piperacillin  sod-tazobactam so]   Review of Systems Review of Systems  Cardiovascular: Negative for chest pain.  Neurological: Negative for headaches.  All other systems reviewed and are negative.    Physical Exam Updated Vital Signs BP (!) 99/58   Pulse 87   Temp 98.9 F (37.2 C) (Oral)   Resp 20   Ht 6\' 2"  (1.88 m)   Wt 56.7 kg (125 lb)   SpO2 95%   BMI 16.05 kg/m   Physical Exam  Constitutional: He appears well-developed and well-nourished.  HENT:  Head: Normocephalic and atraumatic.  Eyes: Conjunctivae are normal.  Neck: Neck  supple.  Cardiovascular: Normal rate and regular rhythm.  No murmur heard. Pulmonary/Chest: Effort normal and breath sounds normal. No respiratory distress.  Abdominal: Soft. There is no tenderness.  Musculoskeletal: He exhibits no edema.  Neurological: He is alert.  Skin: Skin is warm and dry.  Psychiatric: He has a normal mood and affect.  Nursing note and vitals reviewed.    ED Treatments / Results  Labs (all labs ordered are listed, but only abnormal results are displayed) Labs Reviewed  COMPREHENSIVE METABOLIC PANEL - Abnormal; Notable for the following components:      Result Value   Potassium 3.3 (*)    Glucose, Bld 111 (*)    Creatinine, Ser 0.54 (*)    Total Bilirubin 2.0 (*)    All other components within normal limits  CBC WITH DIFFERENTIAL/PLATELET - Abnormal; Notable for the following components:   WBC 21.1 (*)    RBC 3.32 (*)    Hemoglobin 9.0 (*)    HCT 26.8 (*)    RDW 23.0 (*)    Platelets 506 (*)    All other components within normal limits  RETICULOCYTES - Abnormal; Notable for the following components:   Retic Ct Pct 11.5 (*)    RBC. 3.32 (*)    Retic Count, Absolute 381.8 (*)    All other components within normal limits    EKG None  Radiology No results found.  Procedures Procedures (including critical care time)  Medications Ordered in ED Medications  0.45 % sodium chloride infusion ( Intravenous New Bag/Given 05/18/17 1529)  ondansetron (ZOFRAN) injection 4 mg (4 mg Intravenous Given 05/18/17 1529)  HYDROmorphone (DILAUDID) injection 2 mg (has no administration in time range)    Or  HYDROmorphone (DILAUDID) injection 2 mg (has no administration in time range)  HYDROmorphone (DILAUDID) injection 2 mg (has no administration in time range)    Or  HYDROmorphone (DILAUDID) injection 2 mg (has no administration in time range)  diphenhydrAMINE (BENADRYL) injection 25 mg (25 mg Intravenous Given 05/18/17 1529)  HYDROmorphone (DILAUDID) injection 2 mg  (2 mg Intravenous Given 05/18/17 1530)    Or  HYDROmorphone (DILAUDID) injection 2 mg ( Subcutaneous See Alternative 05/18/17 1530)  HYDROmorphone (DILAUDID) injection 2 mg (2 mg Intravenous Given 05/18/17 1606)    Or  HYDROmorphone (DILAUDID) injection 2 mg ( Subcutaneous See Alternative 05/18/17 1606)     Initial Impression / Assessment and Plan / ED Course  I have reviewed the triage vital signs and the nursing notes.  Pertinent labs & imaging results that were available during my care of the patient were reviewed by me and considered in my medical decision making (see chart for details).     MDM  Pt has elevated wbc count on cbc.   Urine shows wbc's.  Pt given IV Rocephin.  Pt given multiple dosages of pain medication.  He reports he feels  much better.    Final Clinical Impressions(s) / ED Diagnoses   Final diagnoses:  Acute cystitis without hematuria  Sickle cell crisis [facility])    ED Discharge Orders    None     Meds ordered this encounter  Medications  . 0.45 % sodium chloride infusion  . diphenhydrAMINE (BENADRYL) injection 25 mg  . ondansetron (ZOFRAN) injection 4 mg  . OR Linked Order Group   . HYDROmorphone (DILAUDID) injection 2 mg   . HYDROmorphone (DILAUDID) injection 2 mg  . OR Linked Order Group   . HYDROmorphone (DILAUDID) injection 2 mg   . HYDROmorphone (DILAUDID) injection 2 mg  . OR Linked Order Group   . HYDROmorphone (DILAUDID) injection 2 mg   . HYDROmorphone (DILAUDID) injection 2 mg  . OR Linked Order Group   . HYDROmorphone (DILAUDID) injection 2 mg   . HYDROmorphone (DILAUDID) injection 2 mg  . cefTRIAXone (ROCEPHIN) 1 g in sodium chloride 0.9 % 100 mL IVPB    Order Specific Question:   Antibiotic Indication:    Answer:   UTI  . doxycycline (VIBRAMYCIN) 100 MG capsule    Sig: Take 1 capsule (100 mg total) by mouth 2 (two) times daily.    Dispense:  20 capsule    Refill:  0    Order Specific Question:   Supervising Provider    Answer:   MILLER,  BRIAN [[id number]]  . HYDROmorphone (DILAUDID) injection 2 mg     Sidney Ace 05/18/17 2046    Little, Wenda Overland, MD 05/19/17 [phone]

## 2017-05-18 NOTE — Progress Notes (Signed)
[facility] Note  05/22/2017     CHIEF COMPLAINT Patient presents for Retina Evaluation   HISTORY OF PRESENT ILLNESS: Timothy Marks is a 27 y.o. male who presents to the clinic today for:   HPI    Retina Evaluation    In both eyes.  This started 3 weeks ago.  Associated Symptoms Negative for Blind Spot, Glare, Shoulder/Hip pain, Jaw Claudication, Fatigue, Photophobia, Distortion, Floaters, Redness, Scalp Tenderness, Weight Loss, Fever, Trauma, Pain and Flashes.  Context:  distance vision, mid-range vision, near vision, night driving, watching TV and reading.  Treatments tried include no treatments.  Response to treatment was no improvement.  I, the attending physician,  performed the HPI with the patient and updated documentation appropriately.          Comments    Referral of DR. DeMarco for retina evaluation(sickle cell) Patient states he noticed In the past three weeks his vision has become blurry. Denies flashes, floaters and ocular pain. Denies gtt's/vit's       Last edited by Bernarda Caffey, MD on 05/22/2017  1:20 PM. (History)    Pt states he has sickle cell, pt states he has had flare ups of pain in back;   Referring physician: Monna Fam, MD [place], [place] [address]  HISTORICAL INFORMATION:   Selected notes from the MEDICAL RECORD NUMBER Referred by Dr. 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 (Ophthalmic Drugs)   No current facility-administered medications for this visit.  (Ophthalmic Drugs)   Current Outpatient Medications (Other)  Medication Sig  . cholecalciferol (VITAMIN D) 1000 UNITS tablet Take 2 tablets (2,000 Units total) by mouth daily.  Marland Kitchen doxycycline (VIBRAMYCIN) 100 MG capsule Take 1 capsule (100 mg total) by mouth 2  (two) times daily.  . folic acid (FOLVITE) 1 MG tablet Take 1 tablet (1 mg total) by mouth daily.  . hydroxyurea (HYDREA) 500 MG capsule Take 1 capsule (500 mg total) by mouth 2 (two) times daily. May take with food to minimize GI side effects.  Marland Kitchen ibuprofen (ADVIL,MOTRIN) 800 MG tablet Take 1 tablet (800 mg total) by mouth 3 (three) times daily.  Marland Kitchen oxyCODONE-acetaminophen (PERCOCET) 10-325 MG tablet Take 1 tablet by mouth every 6 (six) hours as needed for pain.  . potassium chloride SA (K-DUR,KLOR-CON) 20 MEQ tablet Take 2 tablets (40 mEq total) by mouth daily.  . Vitamin D, Ergocalciferol, (DRISDOL) 50000 units CAPS capsule Take 1 capsule (50,000 Units total) by mouth every 7 (seven) days.   No current facility-administered medications for this visit.  (Other)      REVIEW OF SYSTEMS: ROS    Positive for: Eyes, Heme/Lymph   Negative for: Constitutional, Gastrointestinal, Neurological, Skin, Genitourinary, Musculoskeletal, HENT, Endocrine, Cardiovascular, Respiratory, Psychiatric, Allergic/Imm   Last edited by Zenovia Jordan, LPN on 10/17/8099  7:51 PM. (History)       ALLERGIES Allergies  Allergen Reactions  . Levaquin [Levofloxacin] Itching  . Morphine And Related Hives  . Toradol [Ketorolac Tromethamine] Itching  . Tramadol Hives  . Vancomycin Itching  . Zosyn [Piperacillin Sod-Tazobactam So] Itching and Rash    Has taken rocephin in past    PAST MEDICAL HISTORY Past Medical History:  Diagnosis Date  . Depression   . 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 Sickle cell anemia (HCC)   . Vitamin D deficiency y-6   Past Surgical History:  Procedure Laterality Date  . CHOLECYSTECTOMY      FAMILY HISTORY Family History  Problem Relation Age of Onset  . Sickle cell trait Mother   . Sickle cell trait Father     SOCIAL HISTORY Social History   Tobacco Use  . Smoking status: Never Smoker  . Smokeless  tobacco: Never Used  Substance Use Topics  . Alcohol use: No  . Drug use: No    Types: Marijuana    Comment: denies use 05/01/2017         OPHTHALMIC EXAM:  Base Eye Exam    Visual Acuity (Snellen - Linear)      Right Left   Dist [place] 20/50 20/50 -1   Dist ph [place] 20/25 20/25 -1       Tonometry (Tonopen, 1:13 PM)      Right Left   Pressure 18 18       Pupils      Dark Light Shape React APD   Right 4 2 Round Brisk None   Left 4 2 Round Brisk None       Visual Fields (Counting fingers)      Left Right    Full Full       Extraocular Movement      Right Left    Full, Ortho Full, Ortho       Neuro/Psych    Oriented x3:  Yes   Mood/Affect:  Normal       Dilation    Both eyes:  1.0% Mydriacyl, 2.5% Phenylephrine @ 1:13 PM        Slit Lamp and Fundus Exam    Slit Lamp Exam      Right Left   Lids/Lashes Normal Normal   Conjunctiva/Sclera Mild Melanosis Mild Melanosis   Cornea Clear Clear   Anterior Chamber Deep and quiet Deep and quiet   Iris Round and dilated Round and dilated   Lens Clear Clear   Vitreous Normal Normal       Fundus Exam      Right Left   Disc pink and sharp pink and sharp   C/D Ratio 0.5 0.2   Macula Good foveal reflex, mild Retinal pigment epithelial mottling, rare Drusen, No heme or edema Good foveal reflex, mild Retinal pigment epithelial mottling / drusen temporal to fovea   Vessels Mild Vascular attenuation, no NV; peripheral sclerosis temporally Normal   Periphery Attached; punctate sunburst lesion at 1030, old sub-hyaloid hemorrhage 10 attached; Small pigmented chorioretinal scar / sunburst lesion at 1100        Refraction    Manifest Refraction (Auto)      Sphere Cylinder Axis Dist VA   Right -1.50 +3.25 009    Left -1.25 +2.50 161        Manifest Refraction #2      Sphere Cylinder Axis Dist VA   Right -2.00 +2.75 030 20/25   Left -2.75 +1.75 171 20/25          IMAGING AND PROCEDURES  Imaging and Procedures for  05/22/17  OCT, Retina - OU - Both Eyes       Right Eye Quality was good. Central Foveal Thickness: 242. Progression has no prior data. Findings include normal foveal contour, no IRF, no SRF (Tr ERM; tr drusen).   Left Eye Quality was good. Central Foveal Thickness:  246. Progression has no prior data. Findings include normal foveal contour, no IRF, no SRF, retinal drusen  (Tr ERM).   Notes *Images captured and stored on drive  Diagnosis / Impression:  NFP; no IRF/SRF; tr drusen OU (OS > OD)  Clinical management:  See below  Abbreviations: NFP - Normal foveal profile. CME - cystoid macular edema. PED - pigment epithelial detachment. IRF - intraretinal fluid. SRF - subretinal fluid. EZ - ellipsoid zone. ERM - epiretinal membrane. ORA - outer retinal atrophy. ORT - outer retinal tubulation. SRHM - subretinal hyper-reflective material                 ASSESSMENT/PLAN:    ICD-10-CM   1. 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 Retinal edema H35.81 OCT, Retina - OU - Both Eyes    CANCELED: Fluorescein Angiography Optos (Transit OD)  4. Subhyaloid hemorrhage of right eye H35.61   5. Refractive error H52.7     1,2,3. Sickle Cell retinopathy OU -- OD > OS - OD with old/resolving subhyaloid hemorrhage, a small sunburst lesion and some peripheral nonperfusion -- no neovascularization - OS w/ small sunburst lesion superiorly; no neovascularization - recommend Optos FA OU for full vascular eval - pt had to leave prior to FA - pt to return tomorrow for FA only -- OPTOS, transit OD   4. Old subhyaloid hemorrhage OD -- related to 1,2 above - heme white in appearance -- dehemoglobinized - monitor for now - will check for leakage or pooling on FA tomorrow  5. Refractive error - management per Dr. Parke Simmers - glasses ordered per pt    Ophthalmic Meds Ordered this visit:  No orders of the defined types were  placed in this encounter.      Return in about 1 day (around 05/23/2017) for Fluorescein Angiogram.  There are no Patient Instructions on file for this visit.   Explained the diagnoses, plan, and follow up with the patient and they expressed understanding.  Patient expressed understanding of the importance of proper follow up care.   This document serves as a record of services personally performed by Gardiner Sleeper, MD, PhD. It was created on their behalf by Catha Brow, [place], a certified ophthalmic assistant. The creation of this record is the provider's dictation and/or activities during the visit.  Electronically signed by: Catha Brow, [place]  05/22/17 5:53 PM   Gardiner Sleeper, M.D., Ph.D. Diseases & Surgery of the Retina and [place] 05/22/17   I have reviewed the above documentation for accuracy and completeness, and I agree with the above. Gardiner Sleeper, M.D., Ph.D. 05/22/17 5:55 PM    Abbreviations: M myopia (nearsighted); A astigmatism; H hyperopia (farsighted); P presbyopia; Mrx spectacle prescription;  CTL contact lenses; OD right eye; OS left eye; OU both eyes  XT exotropia; ET esotropia; PEK punctate epithelial keratitis; PEE punctate epithelial erosions; DES dry eye syndrome; MGD meibomian gland dysfunction; ATs artificial tears; PFAT's preservative free artificial tears; [place] nuclear sclerotic cataract; PSC posterior subcapsular cataract; ERM epi-retinal membrane; PVD posterior vitreous detachment; RD retinal detachment; DM diabetes mellitus; DR diabetic retinopathy; NPDR non-proliferative diabetic retinopathy; PDR proliferative diabetic retinopathy; CSME clinically significant macular edema; DME diabetic macular edema; dbh dot blot hemorrhages; CWS cotton wool spot; POAG primary open angle glaucoma; C/D cup-to-disc ratio; HVF humphrey visual field; GVF goldmann visual field; OCT optical coherence tomography;  IOP intraocular  pressure; BRVO Branch retinal vein occlusion; CRVO central retinal vein occlusion; CRAO central retinal artery occlusion; BRAO branch retinal artery occlusion; RT retinal tear; SB scleral buckle; PPV pars plana vitrectomy; VH Vitreous hemorrhage; PRP panretinal laser photocoagulation; IVK intravitreal kenalog; VMT vitreomacular traction; MH Macular hole;  NVD neovascularization of the disc; NVE neovascularization elsewhere; AREDS age related eye disease study; ARMD age related macular degeneration; POAG primary open angle glaucoma; EBMD epithelial/anterior basement membrane dystrophy; ACIOL anterior chamber intraocular lens; IOL intraocular lens; PCIOL posterior chamber intraocular lens; Phaco/IOL phacoemulsification with intraocular lens placement; [place] photorefractive keratectomy; LASIK laser assisted in situ keratomileusis; HTN hypertension; DM diabetes mellitus; COPD chronic obstructive pulmonary disease

## 2017-05-19 LAB — GC/CHLAMYDIA PROBE AMP (~~LOC~~) NOT AT ARMC
Chlamydia: NEGATIVE
Neisseria Gonorrhea: POSITIVE — AB

## 2017-05-22 ENCOUNTER — Encounter (INDEPENDENT_AMBULATORY_CARE_PROVIDER_SITE_OTHER): Payer: Self-pay | Admitting: Ophthalmology

## 2017-05-22 ENCOUNTER — Ambulatory Visit (INDEPENDENT_AMBULATORY_CARE_PROVIDER_SITE_OTHER): Payer: Medicaid Other | Admitting: Ophthalmology

## 2017-05-22 DIAGNOSIS — H3689 Other retinal disorders in diseases classified elsewhere: Secondary | ICD-10-CM

## 2017-05-22 DIAGNOSIS — H3581 Retinal edema: Secondary | ICD-10-CM

## 2017-05-22 DIAGNOSIS — H527 Unspecified disorder of refraction: Secondary | ICD-10-CM | POA: Diagnosis not present

## 2017-05-22 DIAGNOSIS — D571 Sickle-cell disease without crisis: Secondary | ICD-10-CM | POA: Diagnosis not present

## 2017-05-22 DIAGNOSIS — H3561 Retinal hemorrhage, right eye: Secondary | ICD-10-CM

## 2017-05-22 DIAGNOSIS — H36 Retinal disorders in diseases classified elsewhere: Secondary | ICD-10-CM

## 2017-05-23 ENCOUNTER — Encounter (INDEPENDENT_AMBULATORY_CARE_PROVIDER_SITE_OTHER): Payer: Medicaid Other | Admitting: Ophthalmology

## 2017-05-29 ENCOUNTER — Telehealth: Payer: Self-pay

## 2017-05-30 ENCOUNTER — Telehealth: Payer: Self-pay

## 2017-05-30 ENCOUNTER — Other Ambulatory Visit: Payer: Self-pay | Admitting: Internal Medicine

## 2017-05-30 DIAGNOSIS — D571 Sickle-cell disease without crisis: Secondary | ICD-10-CM

## 2017-05-30 DIAGNOSIS — Z79891 Long term (current) use of opiate analgesic: Secondary | ICD-10-CM

## 2017-05-30 MED ORDER — OXYCODONE-ACETAMINOPHEN 10-325 MG PO TABS: 1.0000 | ORAL_TABLET | Freq: Four times a day (QID) | ORAL | 0 refills | Status: DC | PRN

## 2017-05-30 NOTE — Telephone Encounter (Signed)
Refilled

## 2017-06-01 ENCOUNTER — Encounter: Payer: Self-pay | Admitting: Family Medicine

## 2017-06-01 ENCOUNTER — Ambulatory Visit (INDEPENDENT_AMBULATORY_CARE_PROVIDER_SITE_OTHER): Payer: Medicaid Other | Admitting: Family Medicine

## 2017-06-01 VITALS — BP 136/92 | HR 78 | Temp 97.9°F | Resp 16 | Ht 74.0 in | Wt 124.8 lb

## 2017-06-01 DIAGNOSIS — E559 Vitamin D deficiency, unspecified: Secondary | ICD-10-CM

## 2017-06-01 DIAGNOSIS — Z79891 Long term (current) use of opiate analgesic: Secondary | ICD-10-CM | POA: Diagnosis not present

## 2017-06-01 DIAGNOSIS — D571 Sickle-cell disease without crisis: Secondary | ICD-10-CM | POA: Diagnosis not present

## 2017-06-01 LAB — POCT URINALYSIS DIP (MANUAL ENTRY)
Bilirubin, UA: NEGATIVE
Glucose, UA: NEGATIVE mg/dL
Ketones, POC UA: NEGATIVE mg/dL
Nitrite, UA: NEGATIVE
Spec Grav, UA: 1.015 (ref 1.010–1.025)
Urobilinogen, UA: 1 E.U./dL
pH, UA: 7.5 (ref 5.0–8.0)

## 2017-06-01 MED ORDER — VITAMIN D (ERGOCALCIFEROL) 1.25 MG (50000 UNIT) PO CAPS: 50000.0000 [IU] | ORAL_CAPSULE | ORAL | 1 refills | Status: DC

## 2017-06-01 NOTE — Patient Instructions (Addendum)
  Discussed the importance of drinking 64 ounces of water daily. The Importance of Water. To help prevent pain crises, it is important to drink plenty of water throughout the day. This is because dehydration of red blood cells may lead to the sickling process.  ° ° ° °Sickle Cell Anemia, Adult °Sickle cell anemia is a condition where your red blood cells are shaped like sickles. Red blood cells carry oxygen through the body. Sickle-shaped red blood cells do not live as long as normal red blood cells. They also clump together and block blood from flowing through the blood vessels. These things prevent the body from getting enough oxygen. Sickle cell anemia causes organ damage and pain. It also increases the risk of infection. °Follow these instructions at home: °· Drink enough fluid to keep your pee (urine) clear or pale yellow. Drink more in hot weather and during exercise. °· Do not smoke. Smoking lowers oxygen levels in the blood. °· Only take over-the-counter or prescription medicines as told by your doctor. °· Take antibiotic medicines as told by your doctor. Make sure you finish them even if you start to feel better. °· Take supplements as told by your doctor. °· Consider wearing a medical alert bracelet. This tells anyone caring for you in an emergency of your condition. °· When traveling, keep your medical information, doctors' names, and the medicines you take with you at all times. °· If you have a fever, do not take fever medicines right away. This could cover up a problem. Tell your doctor. °· Keep all follow-up visits with your doctor. Sickle cell anemia requires regular medical care. °Contact a doctor if: °You have a fever. °Get help right away if: °· You feel dizzy or faint. °· You have new belly (abdominal) pain, especially on the left side near the stomach area. °· You have a lasting, often uncomfortable and painful erection of the penis (priapism). If it is not treated right away, you will become  unable to have sex (impotence). °· You have numbness in your arms or legs or you have a hard time moving them. °· You have a hard time talking. °· You have a fever or lasting symptoms for more than 2-3 days. °· You have a fever and your symptoms suddenly get worse. °· You have signs or symptoms of infection. These include: °? Chills. °? Being more tired than normal (lethargy). °? Irritability. °? Poor eating. °? Throwing up (vomiting). °· You have pain that is not helped with medicine. °· You have shortness of breath. °· You have pain in your chest. °· You are coughing up pus-like or bloody mucus. °· You have a stiff neck. °· Your feet or hands swell or have pain. °· Your belly looks bloated. °· Your joints hurt. °This information is not intended to replace advice given to you by your health care provider. Make sure you discuss any questions you have with your health care provider. °Document Released: 11/21/2012 Document Revised: 07/09/2015 Document Reviewed: 09/12/2012 °Elsevier Interactive Patient Education © 2017 Elsevier Inc. ° °

## 2017-06-02 ENCOUNTER — Other Ambulatory Visit: Payer: Self-pay | Admitting: Family Medicine

## 2017-06-02 DIAGNOSIS — D5700 Hb-SS disease with crisis, unspecified: Secondary | ICD-10-CM | POA: Insufficient documentation

## 2017-06-02 DIAGNOSIS — Z5321 Procedure and treatment not carried out due to patient leaving prior to being seen by health care provider: Secondary | ICD-10-CM | POA: Diagnosis not present

## 2017-06-02 DIAGNOSIS — D473 Essential (hemorrhagic) thrombocythemia: Secondary | ICD-10-CM

## 2017-06-02 DIAGNOSIS — D75839 Thrombocytosis, unspecified: Secondary | ICD-10-CM

## 2017-06-02 LAB — CBC WITH DIFFERENTIAL/PLATELET
Basophils Absolute: 0 10*3/uL (ref 0.0–0.2)
Basos: 0 %
EOS (ABSOLUTE): 0.1 10*3/uL (ref 0.0–0.4)
Eos: 1 %
Hematocrit: 32.6 % — ABNORMAL LOW (ref 37.5–51.0)
Hemoglobin: 11.2 g/dL — ABNORMAL LOW (ref 13.0–17.7)
Immature Grans (Abs): 0 10*3/uL (ref 0.0–0.1)
Immature Granulocytes: 0 %
Lymphocytes Absolute: 1.7 10*3/uL (ref 0.7–3.1)
Lymphs: 22 %
MCH: 27.9 pg (ref 26.6–33.0)
MCHC: 34.4 g/dL (ref 31.5–35.7)
MCV: 81 fL (ref 79–97)
Monocytes Absolute: 0.7 10*3/uL (ref 0.1–0.9)
Monocytes: 9 %
Neutrophils Absolute: 5.2 10*3/uL (ref 1.4–7.0)
Neutrophils: 68 %
Platelets: 559 10*3/uL — ABNORMAL HIGH (ref 150–379)
RBC: 4.01 x10E6/uL — ABNORMAL LOW (ref 4.14–5.80)
RDW: 22.5 % — ABNORMAL HIGH (ref 12.3–15.4)
WBC: 7.8 10*3/uL (ref 3.4–10.8)

## 2017-06-02 LAB — RETICULOCYTES: Retic Ct Pct: 11.5 % — ABNORMAL HIGH (ref 0.6–2.6)

## 2017-06-02 MED ORDER — ASPIRIN EC 81 MG PO TBEC
81.0000 mg | DELAYED_RELEASE_TABLET | Freq: Every day | ORAL | 11 refills | Status: DC
Start: 2017-06-02 — End: 2019-01-15

## 2017-06-02 NOTE — Progress Notes (Signed)
Meds ordered this encounter  Medications  . aspirin EC 81 MG tablet    Sig: Take 1 tablet (81 mg total) by mouth daily.    Dispense:  30 tablet    Refill:  [place]  MSN, FNP-C Patient [facility] [address] [place], [place] [address] [phone]

## 2017-06-03 ENCOUNTER — Emergency Department (HOSPITAL_COMMUNITY)
Admission: EM | Admit: 2017-06-03 | Discharge: 2017-06-03 | Disposition: A | Discharge status: Home / Self Care | Payer: Medicaid Other | Attending: Emergency Medicine | Admitting: Emergency Medicine

## 2017-06-03 ENCOUNTER — Encounter (HOSPITAL_COMMUNITY): Payer: Self-pay | Admitting: Emergency Medicine

## 2017-06-03 LAB — COMPREHENSIVE METABOLIC PANEL
ALT: 10 U/L — ABNORMAL LOW (ref 17–63)
AST: 18 U/L (ref 15–41)
Albumin: 3.8 g/dL (ref 3.5–5.0)
Alkaline Phosphatase: 77 U/L (ref 38–126)
Anion gap: 9 (ref 5–15)
BUN: <5 mg/dL — ABNORMAL LOW (ref 6–20)
CO2: 24 mmol/L (ref 22–32)
Calcium: 8.7 mg/dL — ABNORMAL LOW (ref 8.9–10.3)
Chloride: 102 mmol/L (ref 101–111)
Creatinine, Ser: 0.62 mg/dL (ref 0.61–1.24)
GFR calc Af Amer: >60 mL/min (ref >60)
GFR calc non Af Amer: >60 mL/min (ref >60)
Glucose, Bld: 113 mg/dL — ABNORMAL HIGH (ref 65–99)
Potassium: 3.3 mmol/L — ABNORMAL LOW (ref 3.5–5.1)
Sodium: 135 mmol/L (ref 135–145)
Total Bilirubin: 2 mg/dL — ABNORMAL HIGH (ref 0.3–1.2)
Total Protein: 7.6 g/dL (ref 6.5–8.1)

## 2017-06-03 LAB — CBC WITH DIFFERENTIAL/PLATELET
Basophils Absolute: 0 10*3/uL (ref 0.0–0.1)
Basophils Relative: 0 %
Eosinophils Absolute: 0.1 10*3/uL (ref 0.0–0.7)
Eosinophils Relative: 1 %
HCT: 31.2 % — ABNORMAL LOW (ref 39.0–52.0)
Hemoglobin: 10.8 g/dL — ABNORMAL LOW (ref 13.0–17.0)
Lymphocytes Relative: 35 %
Lymphs Abs: 4 10*3/uL (ref 0.7–4.0)
MCH: 29 pg (ref 26.0–34.0)
MCHC: 34.6 g/dL (ref 30.0–36.0)
MCV: 83.6 fL (ref 78.0–100.0)
Monocytes Absolute: 0.5 10*3/uL (ref 0.1–1.0)
Monocytes Relative: 4 %
Neutro Abs: 6.9 10*3/uL (ref 1.7–7.7)
Neutrophils Relative %: 60 %
Platelets: 546 10*3/uL — ABNORMAL HIGH (ref 150–400)
RBC: 3.73 MIL/uL — ABNORMAL LOW (ref 4.22–5.81)
RDW: 21.6 % — ABNORMAL HIGH (ref 11.5–15.5)
WBC: 11.5 10*3/uL — ABNORMAL HIGH (ref 4.0–10.5)

## 2017-06-03 LAB — RETICULOCYTES
RBC.: 3.73 MIL/uL — ABNORMAL LOW (ref 4.22–5.81)
Retic Count, Absolute: 343.2 10*3/uL — ABNORMAL HIGH (ref 19.0–186.0)
Retic Ct Pct: 9.2 % — ABNORMAL HIGH (ref 0.4–3.1)

## 2017-06-03 NOTE — ED Notes (Signed)
Pt notified staff that he is leaving. 

## 2017-06-03 NOTE — ED Notes (Signed)
Updated on wait for treatment room. 

## 2017-06-03 NOTE — ED Triage Notes (Signed)
Pt reports sickle cell crisis onset earlier this afternoon. States pain is in lower back, took Percocet at home with no relief.

## 2017-06-05 ENCOUNTER — Telehealth: Payer: Self-pay

## 2017-06-05 NOTE — Telephone Encounter (Signed)
Patient notified

## 2017-06-05 NOTE — Telephone Encounter (Signed)
-----   Message from Dorena Dew, [place] sent at 06/02/2017 10:03 AM EDT ----- Regarding: lab results Please inform patient that platelet count is elevated, which can potentially lead to blood clots and bleeding.  Recommend a daily aspirin. Will repeat labs at follow up appointment.   [name]  MSN, FNP-C [facility] [address] [place], [place] [address] [phone]

## 2017-06-05 NOTE — Telephone Encounter (Signed)
-----   Message from Dorena Dew, [place] sent at 06/02/2017 10:03 AM EDT ----- Regarding: lab results Please inform patient that platelet count is elevated, which can potentially lead to blood clots and bleeding.  Recommend a daily aspirin. Will repeat labs at follow up appointment.   [name]  MSN, FNP-C [facility] [address] [place], [place] [address] [phone]

## 2017-06-06 LAB — 737588 9+OXYCODONE+CRT-UNBUND
Amphetamine Scrn, Ur: NEGATIVE ng/mL
BARBITURATE SCREEN URINE: NEGATIVE ng/mL
BENZODIAZEPINE SCREEN, URINE: NEGATIVE ng/mL
Cocaine (Metab) Scrn, Ur: NEGATIVE ng/mL
Creatinine(Crt), U: 89.2 mg/dL (ref 20.0–300.0)
Methadone Screen, Urine: NEGATIVE ng/mL
OXYCODONE+OXYMORPHONE UR QL SCN: NEGATIVE ng/mL
Opiate Scrn, Ur: NEGATIVE ng/mL
Ph of Urine: 7.9 (ref 4.5–8.9)
Phencyclidine Qn, Ur: NEGATIVE ng/mL
Propoxyphene Scrn, Ur: NEGATIVE ng/mL

## 2017-06-06 LAB — CANNABINOID (GC/MS), URINE
Cannabinoid: POSITIVE — AB
Carboxy THC (GC/MS): >300 ng/mL

## 2017-06-06 NOTE — Progress Notes (Signed)
Chief Complaint  Patient presents with  . Follow-up    Sickle cell     Subjective:    Patient ID: Timothy Marks, male    DOB: 1990-12-19, 27 y.o.   MRN: [id number]  HPI Timothy Marks, a 27 year old male with a history of sickle cell anemia, HbSS presents for a 1 month follow up and medication management. Patient continues to have pain to lower back. Pain intensity is 6/10 characterized as intermittent and throbbing. He takes Oxycodone 10-325 mg every 6 hours and Ibuprofen every 8 hours as needed for chronic pain.  Patient takes folic acid and hydroxyurea consisently. He denies depression, fatigue, shortness of breath, chest pain, dysuria, nausea, vomiting, or diarrhea.  Past Medical History:  Diagnosis Date  . Depression   . 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 Sickle cell anemia (HCC)   . Vitamin D deficiency y-6   Social History   Socioeconomic History  . Marital status: Single    Spouse name: Not on file  . Number of children: Not on file  . Years of education: Not on file  . Highest education level: Not on file  Occupational History  . Not on file  Social Needs  . Financial resource strain: Not on file  . Food insecurity:    Worry: Not on file    Inability: Not on file  . Transportation needs:    Medical: Not on file    Non-medical: Not on file  Tobacco Use  . Smoking status: Never Smoker  . Smokeless tobacco: Never Used  Substance and Sexual Activity  . Alcohol use: No  . Drug use: No    Types: Marijuana    Comment: denies use 05/01/2017  . Sexual activity: Not on file  Lifestyle  . Physical activity:    Days per week: Not on file    Minutes per session: Not on file  . Stress: Not on file  Relationships  . Social connections:    Talks on phone: Not on file    Gets together: Not on file    Attends religious service: Not on file    Active member of club or organization: Not on file    Attends  meetings of clubs or organizations: Not on file    Relationship status: Not on file  . Intimate partner violence:    Fear of current or ex partner: Not on file    Emotionally abused: Not on file    Physically abused: Not on file    Forced sexual activity: Not on file  Other Topics Concern  . Not on file  Social History Narrative  . Not on file   Immunization History  Administered Date(s) Administered  . DTaP 12/08/1988, 02/02/1989, 04/03/1989, 03/19/1990, 07/09/1993  . Hepatitis A 10/05/1999  . Hepatitis B 07/28/1994, 09/02/1994, 08/19/1999  . HiB (PRP-OMP) 04/03/1989, 05/29/1989, 07/24/1989, 01/06/1990  . IPV 12/08/1988, 02/02/1989, 03/19/1990, 07/09/1993  . Influenza,inj,Quad PF,6+ Mos 03/04/2013, 04/01/2014  . MMR 01/06/1990, 07/09/1993  . Pneumococcal Conjugate-13 10/07/1991  . Pneumococcal Polysaccharide-23 03/04/2013   Review of Systems  Constitutional: Negative.   HENT: Negative.  Negative for dental problem and drooling.   Eyes: Negative.   Respiratory: Negative.   Cardiovascular: Negative.   Gastrointestinal: Negative.   Endocrine: Negative for polydipsia, polyphagia and polyuria.  Genitourinary: Negative.   Musculoskeletal: Positive for arthralgias and back pain.  Skin: Negative.   Allergic/Immunologic: Negative for immunocompromised state.  Neurological:  Negative.   Hematological: Negative.   Psychiatric/Behavioral: Negative.       Objective:   Physical Exam  HENT:  Head: Normocephalic and atraumatic.  Right Ear: External ear normal.  Left Ear: External ear normal.  Nose: Nose normal.  Mouth/Throat: Oropharynx is clear and moist.  Eyes: Pupils are equal, round, and reactive to light. Conjunctivae are normal.  Neck: Normal range of motion. Neck supple.  Pulmonary/Chest: Effort normal and breath sounds normal.  Abdominal: Soft. Bowel sounds are normal.  Neurological: He is alert.  Skin: Skin is warm and dry.  Psychiatric: He has a normal mood and  affect. His behavior is normal. Judgment and thought content normal.      BP (!) 136/92 (BP Location: Left Arm, Patient Position: Sitting, Cuff Size: Small)   Pulse 78   Temp 97.9 F (36.6 C) (Oral)   Resp 16   Ht _0  (1.88 m)   Wt 124 lb 12.8 oz (56.6 kg)   SpO2 99%   BMI 16.02 kg/m  Assessment & Plan:  1. Hb-SS disease without crisis (HCC) Continue Hydrea 1000 mg daily. Will check CBC for absolute neutrophil count and platelets. Will also check reticulocyte count. Will consider increasing dosage. We discussed the need for good hydration, monitoring of hydration status, avoidance of heat, cold, stress, and infection triggers. We discussed the risks and benefits of Hydrea, including bone marrow suppression, the possibility of GI upset, skin ulcers, hair thinning, and teratogenicity. Reminded Timothy Marks of the needed to use contraception while on hydroxyurea due to teratogenicity. The patient was reminded of the need to seek medical attention of any symptoms of bleeding, anemia, or infection. Will start folic acid 1 mg daily to prevent aplastic bone marrow crises.   Pulmonary evaluation - Patient denies severe recurrent wheezes, shortness of breath with exercise, or persistent cough. If these symptoms develop, pulmonary function tests with spirometry will be ordered, and if abnormal, plan on referral to Pulmonology for further evaluation.  Cardiac - Routine screening for pulmonary hypertension is not recommended.  Eye - High risk of proliferative retinopathy. Annual eye exam with retinal exam recommended to patient. Last eye examination 6 months ago  Acute and chronic painful episodes - We agreed on 60 pills per month. Pt states that she primarily utilizes Ibuprofen for mild pain related to sickle cell anemia. We discussed that pt is to receive her Schedule II prescriptions only from [place]. Pt is also aware that the prescription history is available to [place] online through the [facility] CSRS. Controlled  substance agreement signed 11/18/2014. We reminded Timothy Marks that all patients receiving Schedule II narcotics must be seen for follow within one month of prescription being requested. We reviewed the terms of our pain agreement, including the need to keep medicines in a safe locked location away from children or pets, and the need to report excess sedation or constipation, measures to avoid constipation, and policies related to early refills and stolen prescriptions. According to the [place] Chronic Pain Initiative program, we have reviewed details related to analgesia, adverse effects, aberrant behaviors. Reviewed [place] Substance Reporting system prior to prescribing opiate medication, no inconsistencies noted.   - POCT urinalysis dipstick - 440102 9+OXYCODONE+CRT-UNBUND; Future - CBC with Differential - Reticulocytes - 725366 9+OXYCODONE+CRT-UNBUND  2. Chronic prescription opiate use Reviewed [place] Substance Reporting system prior to prescribing opiate medications. No inconsistencies noted.   - [id number] 9+OXYCODONE+CRT-UNBUND  3. Vitamin D deficiency - Vitamin D, Ergocalciferol, (DRISDOL) 50000 units CAPS capsule; Take 1  capsule (50,000 Units total) by mouth every 7 (seven) days.  Dispense: 30 capsule; Refill: 1   RTC: Sickle cell anemia and medication management  [name]  MSN, FNP-C Patient [facility] [address] [place], [place] [address] [phone]

## 2017-06-12 ENCOUNTER — Other Ambulatory Visit: Payer: Self-pay | Admitting: Family Medicine

## 2017-06-12 ENCOUNTER — Telehealth: Payer: Self-pay

## 2017-06-12 DIAGNOSIS — D571 Sickle-cell disease without crisis: Secondary | ICD-10-CM

## 2017-06-12 DIAGNOSIS — Z79891 Long term (current) use of opiate analgesic: Secondary | ICD-10-CM

## 2017-06-12 MED ORDER — OXYCODONE-ACETAMINOPHEN 10-325 MG PO TABS: 1.0000 | ORAL_TABLET | Freq: Four times a day (QID) | ORAL | 0 refills | Status: DC | PRN

## 2017-06-12 NOTE — Progress Notes (Signed)
Reviewed [place] Substance Reporting system prior to prescribing opiate medications. No inconsistencies noted.  Meds ordered this encounter  Medications  . oxyCODONE-acetaminophen (PERCOCET) 10-325 MG tablet    Sig: Take 1 tablet by mouth every 6 (six) hours as needed for up to 15 days for pain.    Dispense:  60 tablet    Refill:  0    Order Specific Question:   Supervising Provider    Answer:   Tresa Garter [[id number]]    [name]  MSN, FNP-C Patient Timothy Marks [address] [place], [name] [address] [phone]

## 2017-06-26 ENCOUNTER — Other Ambulatory Visit: Payer: Self-pay | Admitting: Family Medicine

## 2017-06-26 ENCOUNTER — Telehealth: Payer: Self-pay

## 2017-06-26 DIAGNOSIS — Z79891 Long term (current) use of opiate analgesic: Secondary | ICD-10-CM

## 2017-06-26 DIAGNOSIS — D571 Sickle-cell disease without crisis: Secondary | ICD-10-CM

## 2017-06-26 MED ORDER — OXYCODONE-ACETAMINOPHEN 10-325 MG PO TABS: 1.0000 | ORAL_TABLET | Freq: Four times a day (QID) | ORAL | 0 refills | Status: DC | PRN

## 2017-06-26 NOTE — Progress Notes (Signed)
Reviewed [place] Substance Reporting system prior to prescribing opiate medications. No inconsistencies noted.    Meds ordered this encounter  Medications  . oxyCODONE-acetaminophen (PERCOCET) 10-325 MG tablet    Sig: Take 1 tablet by mouth every 6 (six) hours as needed for up to 15 days for pain.    Dispense:  60 tablet    Refill:  0    Rx not to be filled until 06/27/2017    Order Specific Question:   Supervising Provider    Answer:   Tresa Garter [[id number]]     Timothy Pounds  MSN, FNP-C Patient [facility] [address] [place], [place] [address] [phone]

## 2017-07-11 ENCOUNTER — Telehealth: Payer: Self-pay

## 2017-07-11 ENCOUNTER — Other Ambulatory Visit: Payer: Self-pay | Admitting: Internal Medicine

## 2017-07-11 DIAGNOSIS — Z79891 Long term (current) use of opiate analgesic: Secondary | ICD-10-CM

## 2017-07-11 DIAGNOSIS — D571 Sickle-cell disease without crisis: Secondary | ICD-10-CM

## 2017-07-11 MED ORDER — OXYCODONE-ACETAMINOPHEN 10-325 MG PO TABS: 1.0000 | ORAL_TABLET | Freq: Four times a day (QID) | ORAL | 0 refills | Status: DC | PRN

## 2017-07-11 NOTE — Telephone Encounter (Signed)
Refilled sent to [facility]

## 2017-07-13 ENCOUNTER — Other Ambulatory Visit: Payer: Self-pay | Admitting: Family Medicine

## 2017-07-16 ENCOUNTER — Inpatient Hospital Stay (HOSPITAL_COMMUNITY)
Admission: EM | Admit: 2017-07-16 | Discharge: 2017-07-18 | DRG: 812 | Discharge status: Against Medical Advice | Payer: Medicaid Other | Attending: Internal Medicine | Admitting: Internal Medicine

## 2017-07-16 ENCOUNTER — Emergency Department (HOSPITAL_COMMUNITY): Payer: Medicaid Other

## 2017-07-16 ENCOUNTER — Encounter (HOSPITAL_COMMUNITY): Payer: Self-pay | Admitting: *Deleted

## 2017-07-16 ENCOUNTER — Other Ambulatory Visit: Payer: Self-pay

## 2017-07-16 DIAGNOSIS — Z832 Family history of diseases of the blood and blood-forming organs and certain disorders involving the immune mechanism: Secondary | ICD-10-CM

## 2017-07-16 DIAGNOSIS — Z79899 Other long term (current) drug therapy: Secondary | ICD-10-CM | POA: Diagnosis not present

## 2017-07-16 DIAGNOSIS — Z885 Allergy status to narcotic agent status: Secondary | ICD-10-CM | POA: Diagnosis not present

## 2017-07-16 DIAGNOSIS — M879 Osteonecrosis, unspecified: Secondary | ICD-10-CM | POA: Diagnosis present

## 2017-07-16 DIAGNOSIS — Z5329 Procedure and treatment not carried out because of patient's decision for other reasons: Secondary | ICD-10-CM | POA: Diagnosis not present

## 2017-07-16 DIAGNOSIS — D72829 Elevated white blood cell count, unspecified: Secondary | ICD-10-CM | POA: Diagnosis present

## 2017-07-16 DIAGNOSIS — Y636 Underdosing and nonadministration of necessary drug, medicament or biological substance: Secondary | ICD-10-CM | POA: Diagnosis present

## 2017-07-16 DIAGNOSIS — Z888 Allergy status to other drugs, medicaments and biological substances status: Secondary | ICD-10-CM

## 2017-07-16 DIAGNOSIS — R52 Pain, unspecified: Secondary | ICD-10-CM

## 2017-07-16 DIAGNOSIS — F339 Major depressive disorder, recurrent, unspecified: Secondary | ICD-10-CM | POA: Diagnosis present

## 2017-07-16 DIAGNOSIS — E876 Hypokalemia: Secondary | ICD-10-CM

## 2017-07-16 DIAGNOSIS — Z7982 Long term (current) use of aspirin: Secondary | ICD-10-CM

## 2017-07-16 DIAGNOSIS — E559 Vitamin D deficiency, unspecified: Secondary | ICD-10-CM | POA: Diagnosis present

## 2017-07-16 DIAGNOSIS — D571 Sickle-cell disease without crisis: Secondary | ICD-10-CM | POA: Diagnosis present

## 2017-07-16 DIAGNOSIS — D5700 Hb-SS disease with crisis, unspecified: Secondary | ICD-10-CM | POA: Diagnosis present

## 2017-07-16 DIAGNOSIS — Z881 Allergy status to other antibiotic agents status: Secondary | ICD-10-CM | POA: Diagnosis not present

## 2017-07-16 DIAGNOSIS — Z9049 Acquired absence of other specified parts of digestive tract: Secondary | ICD-10-CM

## 2017-07-16 DIAGNOSIS — G894 Chronic pain syndrome: Secondary | ICD-10-CM | POA: Diagnosis present

## 2017-07-16 DIAGNOSIS — F4324 Adjustment disorder with disturbance of conduct: Secondary | ICD-10-CM | POA: Diagnosis present

## 2017-07-16 LAB — CBC WITH DIFFERENTIAL/PLATELET
Abs Immature Granulocytes: 0 10*3/uL (ref 0.0–0.1)
Basophils Absolute: 0 10*3/uL (ref 0.0–0.1)
Basophils Relative: 0 %
Eosinophils Absolute: 0.1 10*3/uL (ref 0.0–0.7)
Eosinophils Relative: 1 %
HCT: 33.4 % — ABNORMAL LOW (ref 39.0–52.0)
Hemoglobin: 11.4 g/dL — ABNORMAL LOW (ref 13.0–17.0)
Immature Granulocytes: 0 %
Lymphocytes Relative: 25 %
Lymphs Abs: 2.8 10*3/uL (ref 0.7–4.0)
MCH: 29.4 pg (ref 26.0–34.0)
MCHC: 34.1 g/dL (ref 30.0–36.0)
MCV: 86.1 fL (ref 78.0–100.0)
Monocytes Absolute: 0.9 10*3/uL (ref 0.1–1.0)
Monocytes Relative: 8 %
Neutro Abs: 7.3 10*3/uL (ref 1.7–7.7)
Neutrophils Relative %: 66 %
Platelets: 422 10*3/uL — ABNORMAL HIGH (ref 150–400)
RBC: 3.88 MIL/uL — ABNORMAL LOW (ref 4.22–5.81)
RDW: 20 % — ABNORMAL HIGH (ref 11.5–15.5)
WBC: 11.1 10*3/uL — ABNORMAL HIGH (ref 4.0–10.5)

## 2017-07-16 LAB — COMPREHENSIVE METABOLIC PANEL
ALT: 12 U/L — ABNORMAL LOW (ref 17–63)
AST: 26 U/L (ref 15–41)
Albumin: 4.3 g/dL (ref 3.5–5.0)
Alkaline Phosphatase: 68 U/L (ref 38–126)
Anion gap: 8 (ref 5–15)
BUN: 5 mg/dL — ABNORMAL LOW (ref 6–20)
CO2: 27 mmol/L (ref 22–32)
Calcium: 8.9 mg/dL (ref 8.9–10.3)
Chloride: 99 mmol/L — ABNORMAL LOW (ref 101–111)
Creatinine, Ser: 0.74 mg/dL (ref 0.61–1.24)
GFR calc Af Amer: >60 mL/min (ref >60)
GFR calc non Af Amer: >60 mL/min (ref >60)
Glucose, Bld: 93 mg/dL (ref 65–99)
Potassium: 3.2 mmol/L — ABNORMAL LOW (ref 3.5–5.1)
Sodium: 134 mmol/L — ABNORMAL LOW (ref 135–145)
Total Bilirubin: 3.9 mg/dL — ABNORMAL HIGH (ref 0.3–1.2)
Total Protein: 7.7 g/dL (ref 6.5–8.1)

## 2017-07-16 LAB — RETICULOCYTES
RBC.: 3.88 MIL/uL — ABNORMAL LOW (ref 4.22–5.81)
Retic Count, Absolute: 252.2 10*3/uL — ABNORMAL HIGH (ref 19.0–186.0)
Retic Ct Pct: 6.5 % — ABNORMAL HIGH (ref 0.4–3.1)

## 2017-07-16 MED ORDER — SODIUM CHLORIDE 0.9 % IV SOLN
25.0000 mg | INTRAVENOUS | Status: DC | PRN
  Administered 2017-07-17: 25 mg via INTRAVENOUS
  Filled 2017-07-16: qty 25
  Filled 2017-07-16: qty 0.5

## 2017-07-16 MED ORDER — PROMETHAZINE HCL 25 MG/ML IJ SOLN: 12.5000 mg | Freq: Four times a day (QID) | INTRAMUSCULAR | Status: DC | PRN

## 2017-07-16 MED ORDER — DIPHENHYDRAMINE HCL 25 MG PO CAPS: 25.0000 mg | ORAL_CAPSULE | ORAL | Status: DC | PRN

## 2017-07-16 MED ORDER — HYDROMORPHONE 1 MG/ML IV SOLN: INTRAVENOUS | Status: DC

## 2017-07-16 MED ORDER — HYDROMORPHONE HCL 2 MG/ML IJ SOLN
0.5000 mg | Freq: Once | INTRAMUSCULAR | Status: AC
  Administered 2017-07-16: 0.5 mg via INTRAVENOUS
  Filled 2017-07-16: qty 1

## 2017-07-16 MED ORDER — ONDANSETRON HCL 4 MG/2ML IJ SOLN
4.0000 mg | Freq: Once | INTRAMUSCULAR | Status: AC
  Administered 2017-07-16: 4 mg via INTRAVENOUS
  Filled 2017-07-16: qty 2

## 2017-07-16 MED ORDER — NALOXONE HCL 0.4 MG/ML IJ SOLN: 0.4000 mg | INTRAMUSCULAR | Status: DC | PRN

## 2017-07-16 MED ORDER — ONDANSETRON HCL 4 MG/2ML IJ SOLN
4.0000 mg | Freq: Four times a day (QID) | INTRAMUSCULAR | Status: DC | PRN
  Administered 2017-07-18: 4 mg via INTRAVENOUS
  Filled 2017-07-16: qty 2

## 2017-07-16 MED ORDER — HYDROMORPHONE HCL 2 MG/ML IJ SOLN
2.0000 mg | INTRAMUSCULAR | Status: AC | PRN
  Administered 2017-07-16 (×3): 2 mg via INTRAVENOUS
  Filled 2017-07-16 (×3): qty 1

## 2017-07-16 MED ORDER — SODIUM CHLORIDE 0.45 % IV SOLN
INTRAVENOUS | Status: AC
  Administered 2017-07-17 (×4): via INTRAVENOUS

## 2017-07-16 MED ORDER — SODIUM CHLORIDE 0.9% FLUSH: 9.0000 mL | INTRAVENOUS | Status: DC | PRN

## 2017-07-16 MED ORDER — ENOXAPARIN SODIUM 40 MG/0.4ML ~~LOC~~ SOLN
40.0000 mg | SUBCUTANEOUS | Status: DC
  Administered 2017-07-17: 40 mg via SUBCUTANEOUS
  Filled 2017-07-16: qty 0.4

## 2017-07-16 MED ORDER — HYDROMORPHONE 1 MG/ML IV SOLN
INTRAVENOUS | Status: DC
  Administered 2017-07-17: 3.5 mg via INTRAVENOUS
  Administered 2017-07-17: 2 mg via INTRAVENOUS
  Administered 2017-07-17: 01:00:00 via INTRAVENOUS
  Administered 2017-07-17: 2.5 mg via INTRAVENOUS
  Administered 2017-07-17: 4.5 mg via INTRAVENOUS
  Administered 2017-07-17: 2 mg via INTRAVENOUS
  Administered 2017-07-18: 3.5 mg via INTRAVENOUS
  Administered 2017-07-18: 4 mg via INTRAVENOUS
  Administered 2017-07-18: 06:00:00 via INTRAVENOUS
  Administered 2017-07-18: 1.5 mg via INTRAVENOUS
  Filled 2017-07-16 (×2): qty 25

## 2017-07-16 MED ORDER — SODIUM CHLORIDE 0.9 % IV SOLN: 25.0000 mg | INTRAVENOUS | Status: DC | PRN

## 2017-07-16 MED ORDER — POTASSIUM CHLORIDE CRYS ER 10 MEQ PO TBCR
30.0000 meq | EXTENDED_RELEASE_TABLET | Freq: Once | ORAL | Status: AC
  Administered 2017-07-17: 30 meq via ORAL
  Filled 2017-07-16: qty 1

## 2017-07-16 MED ORDER — DIPHENHYDRAMINE HCL 25 MG PO CAPS
25.0000 mg | ORAL_CAPSULE | ORAL | Status: DC | PRN
  Administered 2017-07-17: 25 mg via ORAL
  Filled 2017-07-16: qty 1

## 2017-07-16 MED ORDER — POLYETHYLENE GLYCOL 3350 17 G PO PACK: 17.0000 g | PACK | Freq: Every day | ORAL | Status: DC | PRN

## 2017-07-16 MED ORDER — SENNOSIDES-DOCUSATE SODIUM 8.6-50 MG PO TABS
1.0000 | ORAL_TABLET | Freq: Two times a day (BID) | ORAL | Status: DC
  Administered 2017-07-17 – 2017-07-18 (×2): 1 via ORAL
  Filled 2017-07-16 (×3): qty 1

## 2017-07-16 MED ORDER — DIPHENHYDRAMINE HCL 25 MG PO CAPS
25.0000 mg | ORAL_CAPSULE | Freq: Once | ORAL | Status: AC
  Administered 2017-07-16: 25 mg via ORAL
  Filled 2017-07-16: qty 1

## 2017-07-16 MED ORDER — LACTATED RINGERS IV BOLUS
1000.0000 mL | Freq: Once | INTRAVENOUS | Status: AC
  Administered 2017-07-16: 1000 mL via INTRAVENOUS

## 2017-07-16 MED ORDER — ONDANSETRON HCL 4 MG/2ML IJ SOLN: 4.0000 mg | Freq: Four times a day (QID) | INTRAMUSCULAR | Status: DC | PRN

## 2017-07-16 NOTE — H&P (Signed)
TRH H&P   Patient Demographics:    Timothy Marks, is a 27 y.o. male  MRN: [id number]   DOB - Jun 09, 1990  Admit Date - 07/16/2017  Outpatient Primary MD for the patient is Dorena Dew, FNP  Referring MD/NP/PA: Chapman Moss  Outpatient Specialists:   Patient coming from: home  Chief Complaint  Patient presents with  . Sickle Cell Pain Crisis  . back and arm pain      HPI:    Timothy Marks  is a 27 y.o. male, w sickle cell apparently c/o generalized pain in bilateral arms and legs and lower back c/w his sickle cell pain.  Pt states the pain started yesterday.  Doesn't believe that dehydration is an issue in trigger.  Ran out of medication a few days ago  In ED, pt tx with dilaudid iv x2 without relief per ED.  CXR  IMPRESSION: No acute pulmonary process.  Na 134, K 3.2,  Bun 5, creatinine 0.74 Ast 26, Alt 12 Alk phos 68, T. Bili 3.9  Wbc 11.1, Hgb 11.4, Plt 422  retic 6.5  Pt will be admitted for sickle cell crisis.       Review of systems:    In addition to the HPI above,  No Fever-chills, No Headache, No changes with Vision or hearing, No problems swallowing food or Liquids, No Chest pain, Cough or Shortness of Breath, No Abdominal pain, No Nausea or Vommitting, Bowel movements are regular, No Blood in stool or Urine, No dysuria, No new skin rashes or bruises,  No new weakness, tingling, numbness in any extremity, No recent weight gain or loss, No polyuria, polydypsia or polyphagia, No significant Mental Stressors.  A full 10 point Review of Systems was done, except as stated above, all other Review of Systems were negative.   With Past History of the following :    Past Medical History:  Diagnosis Date  . Depression   . 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 Sickle cell  anemia (HCC)   . Vitamin D deficiency y-6      Past Surgical History:  Procedure Laterality Date  . CHOLECYSTECTOMY        Social History:     Social History   Tobacco Use  . Smoking status: Never Smoker  . Smokeless tobacco: Never Used  Substance Use Topics  . Alcohol use: No     Lives - at home w mother  Mobility - walks by self   Family History :     Family History  Problem Relation Age of Onset  . Sickle cell trait Mother   . Sickle cell trait Father      Home Medications:   Prior to Admission medications   Medication Sig Start Date End Date Taking? Authorizing Provider  cholecalciferol (VITAMIN D) 1000 UNITS tablet  Take 2 tablets (2,000 Units total) by mouth daily. 04/09/14  Yes Leana Gamer, MD  folic acid (FOLVITE) 1 MG tablet Take 1 tablet (1 mg total) by mouth daily. 05/01/17  Yes Dorena Dew, FNP  hydroxyurea (HYDREA) 500 MG capsule Take 1 capsule (500 mg total) by mouth 2 (two) times daily. May take with food to minimize GI side effects. 05/01/17  Yes Dorena Dew, FNP  ibuprofen (ADVIL,MOTRIN) 800 MG tablet Take 1 tablet (800 mg total) by mouth 3 (three) times daily. Patient taking differently: Take 800 mg by mouth 3 (three) times daily as needed (pain).  05/01/17  Yes Dorena Dew, FNP  oxyCODONE-acetaminophen (PERCOCET) 10-325 MG tablet Take 1 tablet by mouth every 6 (six) hours as needed for up to 15 days for pain. 07/11/17 07/26/17 Yes Jegede, Marlena Clipper, MD  potassium chloride SA (K-DUR,KLOR-CON) 20 MEQ tablet Take 2 tablets (40 mEq total) by mouth daily. Patient taking differently: Take 20 mEq by mouth daily.  04/06/17  Yes Leana Gamer, MD  Vitamin D, Ergocalciferol, (DRISDOL) 50000 units CAPS capsule Take 1 capsule (50,000 Units total) by mouth every 7 (seven) days. Patient taking differently: Take 50,000 Units by mouth every Wednesday.  06/01/17  Yes Dorena Dew, FNP  aspirin EC 81 MG tablet Take 1 tablet (81 mg  total) by mouth daily. Patient not taking: Reported on 07/16/2017 06/02/17   Dorena Dew, FNP     Allergies:     Allergies  Allergen Reactions  . Buprenorphine Hcl Hives  . Levaquin [Levofloxacin] Itching  . Meperidine Rash  . Morphine And Related Hives  . Toradol [Ketorolac Tromethamine] Itching  . Tramadol Hives  . Vancomycin Itching  . Zosyn [Piperacillin Sod-Tazobactam So] Itching and Rash    Has taken rocephin in past     Physical Exam:   Vitals  Blood pressure 113/66, pulse 69, temperature 99.4 F (37.4 C), resp. rate 15, height 6' 2"  (1.88 m), weight 61.2 kg (135 lb), SpO2 99 %.   1. General lying in bed ,    2. Normal affect and insight, Not Suicidal or Homicidal, Awake Alert, Oriented X 3.  3. No F.N deficits, ALL C.Nerves Intact, Strength 5/5 all 4 extremities, Sensation intact all 4 extremities, Plantars down going.  4. Ears and Eyes appear Normal, Conjunctivae clear, PERRLA. Moist Oral Mucosa.  5. Supple Neck, No JVD, No cervical lymphadenopathy appriciated, No Carotid Bruits.  6. Symmetrical Chest wall movement, Good air movement bilaterally, CTAB.  7. RRR, No Gallops, Rubs or Murmurs, No Parasternal Heave.  8. Positive Bowel Sounds, Abdomen Soft, No tenderness, No organomegaly appriciated,No rebound -guarding or rigidity.  9.  No Cyanosis, Normal Skin Turgor, No Skin Rash or Bruise.  10. Good muscle tone,  joints appear normal , no effusions, Normal ROM.  11. No Palpable Lymph Nodes in Neck or Axillae      Data Review:    CBC Recent Labs  Lab 07/16/17 1919  WBC 11.1*  HGB 11.4*  HCT 33.4*  PLT 422*  MCV 86.1  MCH 29.4  MCHC 34.1  RDW 20.0*  LYMPHSABS 2.8  MONOABS 0.9  EOSABS 0.1  BASOSABS 0.0   ------------------------------------------------------------------------------------------------------------------  Chemistries  Recent Labs  Lab 07/16/17 1919  NA 134*  K 3.2*  CL 99*  CO2 27  GLUCOSE 93  BUN 5*  CREATININE  0.74  CALCIUM 8.9  AST 26  ALT 12*  ALKPHOS 68  BILITOT 3.9*   ------------------------------------------------------------------------------------------------------------------ estimated  creatinine clearance is 119 mL/min (by C-G formula based on SCr of 0.74 mg/dL). ------------------------------------------------------------------------------------------------------------------ No results for input(s): TSH, T4TOTAL, T3FREE, THYROIDAB in the last 72 hours.  Invalid input(s): FREET3  Coagulation profile No results for input(s): INR, PROTIME in the last 168 hours. ------------------------------------------------------------------------------------------------------------------- No results for input(s): DDIMER in the last 72 hours. -------------------------------------------------------------------------------------------------------------------  Cardiac Enzymes No results for input(s): CKMB, TROPONINI, MYOGLOBIN in the last 168 hours.  Invalid input(s): CK ------------------------------------------------------------------------------------------------------------------ No results found for: BNP   ---------------------------------------------------------------------------------------------------------------  Urinalysis    Component Value Date/Time   COLORURINE YELLOW 05/18/2017 [place] 05/18/2017 1702   LABSPEC 1.008 05/18/2017 1702   PHURINE 8.0 05/18/2017 1702   GLUCOSEU NEGATIVE 05/18/2017 1702   HGBUR SMALL (A) 05/18/2017 1702   BILIRUBINUR negative 06/01/2017 0917   KETONESUR negative 06/01/2017 [place] 05/18/2017 1702   PROTEINUR trace (A) 06/01/2017 0917   PROTEINUR NEGATIVE 05/18/2017 1702   UROBILINOGEN 1.0 06/01/2017 0917   UROBILINOGEN 2.0 (H) 05/01/2017 0955   NITRITE Negative 06/01/2017 0917   NITRITE NEGATIVE 05/18/2017 1702   LEUKOCYTESUR Small (1+) (A) 06/01/2017 0917     ----------------------------------------------------------------------------------------------------------------   Imaging Results:    Dg Chest 2 View  Result Date: 07/16/2017 CLINICAL DATA:  Pain. Sickle cell patient with right arm and back pain. EXAM: CHEST - 2 VIEW COMPARISON:  Radiographs 05/18/2017 FINDINGS: The cardiomediastinal contours are normal. The lungs are clear. Pulmonary vasculature is normal. No consolidation, pleural effusion, or pneumothorax. Osseous findings of sickle cell with H-shaped vertebra and scleroses. No acute osseous abnormalities are seen. IMPRESSION: No acute pulmonary process. Electronically Signed   By: Jeb Levering M.D.   On: 07/16/2017 20:22       Assessment & Plan:    Principal Problem:   Sickle cell crisis (HCC) Active Problems:   Hypokalemia   Sickle cell anemia (HCC)    Sickle cell crisis Ns iv o2 [place] Dilaudid pca Check cbc in am  Hypokalemia Replete Check cmp in am  Sickle cell Cont hydroxyurea Cont folic acid  Vitamin D def Cont vitamin D.   DVT Prophylaxis  Lovenox - SCDs   AM Labs Ordered, also please review Full Orders  Family Communication: Admission, patients condition and plan of care including tests being ordered have been discussed with the patient who indicate understanding and agree with the plan and Code Status.  Code Status FULL CODE  Likely DC to  home  Condition GUARDED    Consults called: none  Admission status: inpatient   Time spent in minutes : 45   Jani Gravel M.D on 07/16/2017 at 10:36 PM  Between 7am to 7pm - Pager - [phone]  . After 7pm go to [email or web address] - password [facility]  [facility] - Office  [phone]

## 2017-07-16 NOTE — ED Provider Notes (Signed)
[facility] EMERGENCY DEPARTMENT Provider Note   CSN: [id number] Arrival date & time: 07/16/17  1807     History   Chief Complaint Chief Complaint  Patient presents with  . Sickle Cell Pain Crisis  . back and arm pain    HPI Timothy Marks is a 27 y.o. male.  HPI  Patient is a 27 year old male with past medical history of sickle cell disease comes in today stating that he is having a sickle cell pain crisis.  Patient states his pain started approximately 2 days ago it is not worsening since that time.  Patient denies any chest pain or shortness of breath.  Patient denies any recent illness, fevers or chills, dysuria.  Patient states that his pain is located mostly in his back as well as his bilateral upper extremities which is consistent with previous crises.   Past Medical History:  Diagnosis Date  . Depression   . 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 Sickle cell anemia (HCC)   . Vitamin D deficiency y-6    Patient Active Problem List   Diagnosis Date Noted  . Sickle cell crisis ([place]) 07/16/2017  . Adjustment disorder with disturbance of conduct   . Constipation 08/03/2014  . 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 Protein-calorie malnutrition, severe ([place]) 04/09/2014  . Major depressive disorder, recurrent, severe without psychotic features ([place])   . 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 Vitamin D deficiency   . Sickle cell anemia ([place]) 03/30/2014  . Hyperbilirubinemia 03/30/2014  . Hypokalemia 02/07/2014  . Aggressive behavior 01/25/2013    Past Surgical History:  Procedure Laterality Date  . CHOLECYSTECTOMY          Home Medications    Prior to Admission medications   Medication Sig Start Date End Date Taking? Authorizing Provider  cholecalciferol (VITAMIN D) 1000 UNITS tablet Take 2 tablets (2,000 Units total) by mouth daily. 04/09/14  Yes Leana Gamer, MD  folic acid (FOLVITE) 1 MG tablet Take 1 tablet (1 mg total) by mouth daily. 05/01/17  Yes Dorena Dew, FNP  hydroxyurea (HYDREA) 500 MG capsule Take 1 capsule (500 mg total) by mouth 2 (two) times daily. May take with food to minimize GI side effects. 05/01/17  Yes Dorena Dew, FNP  ibuprofen (ADVIL,MOTRIN) 800 MG tablet Take 1 tablet (800 mg total) by mouth 3 (three) times daily. Patient taking differently: Take 800 mg by mouth 3 (three) times daily as needed (pain).  05/01/17  Yes Dorena Dew, FNP  oxyCODONE-acetaminophen (PERCOCET) 10-325 MG tablet Take 1 tablet by mouth every 6 (six) hours as needed for up to 15 days for pain. 07/11/17 07/26/17 Yes Jegede, Marlena Clipper, MD  potassium chloride SA (K-DUR,KLOR-CON) 20 MEQ tablet Take 2 tablets (40 mEq total) by mouth daily. Patient taking differently: Take 20 mEq by mouth daily.  04/06/17  Yes Leana Gamer, MD  Vitamin D, Ergocalciferol, (DRISDOL) 50000 units CAPS capsule Take 1 capsule (50,000 Units total) by mouth every 7 (seven) days. Patient taking differently: Take 50,000 Units by mouth every Wednesday.  06/01/17  Yes Dorena Dew, FNP  aspirin EC 81 MG tablet Take 1 tablet (81 mg total) by mouth daily. Patient not taking: Reported on 07/16/2017 06/02/17   Dorena Dew, FNP    Family History Family History  Problem Relation Age of Onset  . Sickle cell trait Mother   .  Sickle cell trait Father     Social History Social History   Tobacco Use  . Smoking status: Never Smoker  . Smokeless tobacco: Never Used  Substance Use Topics  . Alcohol use: No  . Drug use: No    Types: Marijuana    Comment: denies use 05/01/2017     Allergies   Buprenorphine hcl; Levaquin [levofloxacin]; Meperidine; Morphine and related; Toradol [ketorolac tromethamine]; Tramadol; Vancomycin; and Zosyn [piperacillin sod-tazobactam so]   Review of Systems Review of Systems  Constitutional: Negative for chills and  fever.  Respiratory: Negative for shortness of breath.   Cardiovascular: Negative for chest pain and leg swelling.  Musculoskeletal: Positive for back pain. Negative for joint swelling.  All other systems reviewed and are negative.    Physical Exam Updated Vital Signs BP 116/70   Pulse 67   Temp 98.4 F (36.9 C) (Oral)   Resp 17   Ht 6\' 2"  (1.88 m)   Wt 61.2 kg (135 lb)   SpO2 100%   BMI 17.33 kg/m   Physical Exam  Constitutional: He appears well-developed and well-nourished.  HENT:  Head: Normocephalic and atraumatic.  Eyes: Conjunctivae are normal.  Neck: Neck supple.  Cardiovascular: Normal rate and regular rhythm.  No murmur heard. Pulmonary/Chest: Effort normal and breath sounds normal. No respiratory distress. He has no wheezes. He has no rales.  Abdominal: Soft. There is no tenderness.  Musculoskeletal: Normal range of motion. He exhibits tenderness. He exhibits no edema.  Neurological: He is alert.  Skin: Skin is warm and dry.  Psychiatric: He has a normal mood and affect.  Nursing note and vitals reviewed.    ED Treatments / Results  Labs (all labs ordered are listed, but only abnormal results are displayed) Labs Reviewed  COMPREHENSIVE METABOLIC PANEL - Abnormal; Notable for the following components:      Result Value   Sodium 134 (*)    Potassium 3.2 (*)    Chloride 99 (*)    BUN 5 (*)    ALT 12 (*)    Total Bilirubin 3.9 (*)    All other components within normal limits  CBC WITH DIFFERENTIAL/PLATELET - Abnormal; Notable for the following components:   WBC 11.1 (*)    RBC 3.88 (*)    Hemoglobin 11.4 (*)    HCT 33.4 (*)    RDW 20.0 (*)    Platelets 422 (*)    All other components within normal limits  RETICULOCYTES - Abnormal; Notable for the following components:   Retic Ct Pct 6.5 (*)    RBC. 3.88 (*)    Retic Count, Absolute 252.2 (*)    All other components within normal limits  COMPREHENSIVE METABOLIC PANEL  CBC WITH  DIFFERENTIAL/PLATELET    EKG None  Radiology Dg Chest 2 View  Result Date: 07/16/2017 CLINICAL DATA:  Pain. Sickle cell patient with right arm and back pain. EXAM: CHEST - 2 VIEW COMPARISON:  Radiographs 05/18/2017 FINDINGS: The cardiomediastinal contours are normal. The lungs are clear. Pulmonary vasculature is normal. No consolidation, pleural effusion, or pneumothorax. Osseous findings of sickle cell with H-shaped vertebra and scleroses. No acute osseous abnormalities are seen. IMPRESSION: No acute pulmonary process. Electronically Signed   By: Jeb Levering M.D.   On: 07/16/2017 20:22    Procedures Procedures (including critical care time)  Medications Ordered in ED Medications  senna-docusate (Senokot-S) tablet 1 tablet (has no administration in time range)  polyethylene glycol (MIRALAX / GLYCOLAX) packet 17 g (has no  administration in time range)  enoxaparin (LOVENOX) injection 40 mg (has no administration in time range)  0.45 % sodium chloride infusion (has no administration in time range)  diphenhydrAMINE (BENADRYL) capsule 25 mg (has no administration in time range)    Or  diphenhydrAMINE (BENADRYL) 25 mg in sodium chloride 0.9 % 50 mL IVPB (has no administration in time range)  potassium chloride SA (K-DUR,KLOR-CON) CR tablet 30 mEq (has no administration in time range)  HYDROmorphone (DILAUDID) 1 mg/mL PCA injection (has no administration in time range)  naloxone (NARCAN) injection 0.4 mg (has no administration in time range)    And  sodium chloride flush (NS) 0.9 % injection 9 mL (has no administration in time range)  ondansetron (ZOFRAN) injection 4 mg (has no administration in time range)  promethazine (PHENERGAN) injection 12.5 mg (has no administration in time range)  lactated ringers bolus 1,000 mL (0 mLs Intravenous Stopped 07/16/17 2132)  HYDROmorphone (DILAUDID) injection 2 mg (2 mg Intravenous Given 07/16/17 2134)  diphenhydrAMINE (BENADRYL) capsule 25 mg (25 mg  Oral Given 07/16/17 1931)  ondansetron (ZOFRAN) injection 4 mg (4 mg Intravenous Given 07/16/17 1930)  lactated ringers bolus 1,000 mL (0 mLs Intravenous Stopped 07/16/17 2338)  HYDROmorphone (DILAUDID) injection 0.5 mg (0.5 mg Intravenous Given 07/16/17 2345)     Initial Impression / Assessment and Plan / ED Course  I have reviewed the triage vital signs and the nursing notes.  Pertinent labs & imaging results that were available during my care of the patient were reviewed by me and considered in my medical decision making (see chart for details).     Patient received 3 doses of 2 mg of Dilaudid as well as IV fluids without improvement in his pain.  Will admit the patient to the hospital for further pain control  Final Clinical Impressions(s) / ED Diagnoses   Final diagnoses:  Pain    ED Discharge Orders    None       Chapman Moss, MD 07/16/17 2351    Elnora Morrison, MD 07/16/17 2352

## 2017-07-16 NOTE — ED Triage Notes (Signed)
Back aldg pain  Pt states he has sickle cell and is in crisis  No porta-cath or hickman

## 2017-07-16 NOTE — ED Notes (Signed)
Pt refuses to have lab drawn here  He wants to wait until he gets in the back to get an iv

## 2017-07-17 ENCOUNTER — Other Ambulatory Visit: Payer: Self-pay

## 2017-07-17 DIAGNOSIS — E876 Hypokalemia: Secondary | ICD-10-CM

## 2017-07-17 DIAGNOSIS — D5700 Hb-SS disease with crisis, unspecified: Principal | ICD-10-CM

## 2017-07-17 DIAGNOSIS — D571 Sickle-cell disease without crisis: Secondary | ICD-10-CM

## 2017-07-17 DIAGNOSIS — R52 Pain, unspecified: Secondary | ICD-10-CM

## 2017-07-17 LAB — CBC WITH DIFFERENTIAL/PLATELET
Basophils Absolute: 0 10*3/uL (ref 0.0–0.1)
Basophils Relative: 0 %
Eosinophils Absolute: 0.1 10*3/uL (ref 0.0–0.7)
Eosinophils Relative: 1 %
HCT: 26.6 % — ABNORMAL LOW (ref 39.0–52.0)
Hemoglobin: 9.3 g/dL — ABNORMAL LOW (ref 13.0–17.0)
Lymphocytes Relative: 27 %
Lymphs Abs: 4 10*3/uL (ref 0.7–4.0)
MCH: 30.5 pg (ref 26.0–34.0)
MCHC: 35 g/dL (ref 30.0–36.0)
MCV: 87.2 fL (ref 78.0–100.0)
Monocytes Absolute: 1.3 10*3/uL — ABNORMAL HIGH (ref 0.1–1.0)
Monocytes Relative: 9 %
Neutro Abs: 9.3 10*3/uL — ABNORMAL HIGH (ref 1.7–7.7)
Neutrophils Relative %: 63 %
Platelets: 345 10*3/uL (ref 150–400)
RBC: 3.05 MIL/uL — ABNORMAL LOW (ref 4.22–5.81)
RDW: 21.1 % — ABNORMAL HIGH (ref 11.5–15.5)
WBC: 14.7 10*3/uL — ABNORMAL HIGH (ref 4.0–10.5)

## 2017-07-17 LAB — COMPREHENSIVE METABOLIC PANEL
ALT: 20 U/L (ref 17–63)
AST: 49 U/L — ABNORMAL HIGH (ref 15–41)
Albumin: 3.9 g/dL (ref 3.5–5.0)
Alkaline Phosphatase: 63 U/L (ref 38–126)
Anion gap: 7 (ref 5–15)
BUN: 6 mg/dL (ref 6–20)
CO2: 25 mmol/L (ref 22–32)
Calcium: 8.7 mg/dL — ABNORMAL LOW (ref 8.9–10.3)
Chloride: 106 mmol/L (ref 101–111)
Creatinine, Ser: 0.5 mg/dL — ABNORMAL LOW (ref 0.61–1.24)
GFR calc Af Amer: >60 mL/min (ref >60)
GFR calc non Af Amer: >60 mL/min (ref >60)
Glucose, Bld: 93 mg/dL (ref 65–99)
Potassium: 3.7 mmol/L (ref 3.5–5.1)
Sodium: 138 mmol/L (ref 135–145)
Total Bilirubin: 5.8 mg/dL — ABNORMAL HIGH (ref 0.3–1.2)
Total Protein: 7 g/dL (ref 6.5–8.1)

## 2017-07-17 MED ORDER — POTASSIUM CHLORIDE CRYS ER 20 MEQ PO TBCR
20.0000 meq | EXTENDED_RELEASE_TABLET | Freq: Every day | ORAL | Status: DC
  Administered 2017-07-17 – 2017-07-18 (×2): 20 meq via ORAL
  Filled 2017-07-17 (×2): qty 1

## 2017-07-17 MED ORDER — OXYCODONE-ACETAMINOPHEN 5-325 MG PO TABS
1.0000 | ORAL_TABLET | Freq: Four times a day (QID) | ORAL | Status: DC | PRN
  Administered 2017-07-17: 1 via ORAL
  Filled 2017-07-17: qty 1

## 2017-07-17 MED ORDER — VITAMIN D3 25 MCG (1000 UNIT) PO TABS
2000.0000 [IU] | ORAL_TABLET | Freq: Every day | ORAL | Status: DC
  Administered 2017-07-17 – 2017-07-18 (×2): 2000 [IU] via ORAL
  Filled 2017-07-17 (×2): qty 2

## 2017-07-17 MED ORDER — OXYCODONE-ACETAMINOPHEN 10-325 MG PO TABS: 1.0000 | ORAL_TABLET | Freq: Four times a day (QID) | ORAL | Status: DC | PRN

## 2017-07-17 MED ORDER — OXYCODONE-ACETAMINOPHEN 5-325 MG PO TABS
1.0000 | ORAL_TABLET | ORAL | Status: DC | PRN
  Administered 2017-07-17: 1 via ORAL
  Filled 2017-07-17: qty 1

## 2017-07-17 MED ORDER — FOLIC ACID 1 MG PO TABS
1.0000 mg | ORAL_TABLET | Freq: Every day | ORAL | Status: DC
  Administered 2017-07-17 – 2017-07-18 (×2): 1 mg via ORAL
  Filled 2017-07-17 (×2): qty 1

## 2017-07-17 MED ORDER — OXYCODONE HCL 5 MG PO TABS
5.0000 mg | ORAL_TABLET | Freq: Four times a day (QID) | ORAL | Status: DC | PRN
  Administered 2017-07-17: 5 mg via ORAL
  Filled 2017-07-17: qty 1

## 2017-07-17 MED ORDER — OXYCODONE HCL 5 MG PO TABS
5.0000 mg | ORAL_TABLET | ORAL | Status: DC | PRN
  Administered 2017-07-17 – 2017-07-18 (×2): 5 mg via ORAL
  Filled 2017-07-17 (×2): qty 1

## 2017-07-17 MED ORDER — VITAMIN D (ERGOCALCIFEROL) 1.25 MG (50000 UNIT) PO CAPS: 50000.0000 [IU] | ORAL_CAPSULE | ORAL | Status: DC

## 2017-07-17 MED ORDER — HYDROXYUREA 500 MG PO CAPS
500.0000 mg | ORAL_CAPSULE | Freq: Two times a day (BID) | ORAL | Status: DC
  Administered 2017-07-17 – 2017-07-18 (×3): 500 mg via ORAL
  Filled 2017-07-17 (×3): qty 1

## 2017-07-17 NOTE — Progress Notes (Signed)
Dilaudid PCA turned off d/t patient noncompliance with wearing required equipment and being verbally abusive and aggressive at the staff. House [facility], Misha to notify covering MD of patient behavior.

## 2017-07-17 NOTE — Progress Notes (Signed)
Patient has continued to remain non compliant with PCA policy. PCA pump will remain off. Provider assessed patient alternative pain medication prescribed. CN RN attempted to administer. Pt stated that RN would need to return at a later time to administer. Will f/u.

## 2017-07-17 NOTE — Progress Notes (Signed)
Patient ID: Raffael Bugarin, male   DOB: 1990/09/16, 27 y.o.   MRN: [id number] Subjective:  Patient is uncooperative, paranoid, does not want to be touched or examined. Patient educated. He does not have any new complaint, still in pain. He denies any fever or chest pain. He denies SOB.  Objective:  Vital signs in last 24 hours:  Vitals:   07/17/17 1005 07/17/17 1327 07/17/17 1431 07/17/17 1536  BP: 120/75  129/69   Pulse: 89  84   Resp: 12 12 12 14   Temp:   99.1 F (37.3 C)   TempSrc:   Oral   SpO2: 97% 99% 100% 93%  Weight:      Height:        Intake/Output from previous day:   Intake/Output Summary (Last 24 hours) at 07/17/2017 1543 Last data filed at 07/17/2017 1535 Gross per 24 hour  Intake 2747.5 ml  Output 1000 ml  Net 1747.5 ml    Physical Exam: General: Alert, awake, oriented x3, in no acute distress.  HEENT: [place]/AT PEERL, EOMI Neck: Trachea midline,  no masses, no thyromegal,y no JVD, no carotid bruit OROPHARYNX:  Moist, No exudate/ erythema/lesions.  Heart: Regular rate and rhythm, without murmurs, rubs, gallops, PMI non-displaced, no heaves or thrills on palpation.  Lungs: Clear to auscultation, no wheezing or rhonchi noted. No increased vocal fremitus resonant to percussion  Abdomen: Soft, nontender, nondistended, positive bowel sounds, no masses no hepatosplenomegaly noted..  Neuro: No focal neurological deficits noted cranial nerves II through XII grossly intact. DTRs 2+ bilaterally upper and lower extremities. Strength 5 out of 5 in bilateral upper and lower extremities. Musculoskeletal: No warm swelling or erythema around joints, no spinal tenderness noted. Psychiatric: Patient alert and oriented x3, good insight and cognition, good recent to remote recall. Lymph node survey: No cervical axillary or inguinal lymphadenopathy noted.  Lab Results:  Basic Metabolic Panel:    Component Value Date/Time   NA 138 07/17/2017 1211   NA 142 05/01/2017 0937   K 3.7  07/17/2017 1211   CL 106 07/17/2017 1211   CO2 25 07/17/2017 1211   BUN 6 07/17/2017 1211   BUN 4 (L) 05/01/2017 0937   CREATININE 0.50 (L) 07/17/2017 1211   GLUCOSE 93 07/17/2017 1211   CALCIUM 8.7 (L) 07/17/2017 1211   CBC:    Component Value Date/Time   WBC 14.7 (H) 07/17/2017 1211   HGB 9.3 (L) 07/17/2017 1211   HGB 11.2 (L) 06/01/2017 1011   HCT 26.6 (L) 07/17/2017 1211   HCT 32.6 (L) 06/01/2017 1011   PLT 345 07/17/2017 1211   PLT 559 (H) 06/01/2017 1011   MCV 87.2 07/17/2017 1211   MCV 81 06/01/2017 1011   NEUTROABS 9.3 (H) 07/17/2017 1211   NEUTROABS 5.2 06/01/2017 1011   LYMPHSABS 4.0 07/17/2017 1211   LYMPHSABS 1.7 06/01/2017 1011   MONOABS 1.3 (H) 07/17/2017 1211   EOSABS 0.1 07/17/2017 1211   EOSABS 0.1 06/01/2017 1011   BASOSABS 0.0 07/17/2017 1211   BASOSABS 0.0 06/01/2017 1011    No results found for this or any previous visit (from the past 240 hour(s)).  Studies/Results: Dg Chest 2 View  Result Date: 07/16/2017 CLINICAL DATA:  Pain. Sickle cell patient with right arm and back pain. EXAM: CHEST - 2 VIEW COMPARISON:  Radiographs 05/18/2017 FINDINGS: The cardiomediastinal contours are normal. The lungs are clear. Pulmonary vasculature is normal. No consolidation, pleural effusion, or pneumothorax. Osseous findings of sickle cell with H-shaped vertebra and scleroses. No acute  osseous abnormalities are seen. IMPRESSION: No acute pulmonary process. Electronically Signed   By: Jeb Levering M.D.   On: 07/16/2017 20:22    Medications: Scheduled Meds: . cholecalciferol  2,000 Units Oral Daily  . enoxaparin (LOVENOX) injection  40 mg Subcutaneous Q24H  . folic acid  1 mg Oral Daily  . HYDROmorphone   Intravenous Q4H  . hydroxyurea  500 mg Oral BID  . potassium chloride SA  20 mEq Oral Daily  . senna-docusate  1 tablet Oral BID  . [START ON 07/19/2017] Vitamin D (Ergocalciferol)  50,000 Units Oral Q Wed   Continuous Infusions: . sodium chloride 150 mL/hr at  07/17/17 1406  . diphenhydrAMINE Stopped (07/17/17 0522)   PRN Meds:.diphenhydrAMINE **OR** diphenhydrAMINE, naloxone **AND** sodium chloride flush, ondansetron (ZOFRAN) IV, polyethylene glycol, promethazine  Assessment/Plan: Principal Problem:   Sickle cell crisis (HCC) Active Problems:   Hypokalemia   Sickle cell anemia ([place])  1. Hb Sickle Cell Disease with crisis: Adjust IVF D5 .45% Saline to 125 mls/hour, continue weight based Dilaudid PCA, IV Toradol 30 mg Q 6 H, Monitor vitals very closely, Re-evaluate pain scale regularly, 2 L of Oxygen by [place]. 2. Leukocytosis: Reactive, no need for antibiotics 3. Sickle Cell Anemia: Hb at baseline. No indication for blood transfusion. 4. Chronic pain Syndrome: Home pain medications  Code Status: Full Code Family Communication: N/A Disposition Plan: Not yet ready for discharge  Amanie Mcculley  If 7PM-7AM, please contact night-coverage.  07/17/2017, 3:43 PM  LOS: 1 day

## 2017-07-17 NOTE — Progress Notes (Signed)
Pt removed cardiac monitor stating that he is not wearing it or anything else for that matter. He removed oxygen/CO2 sensor from his nose despite repeated education about Dilaudid PCA and requirements for monitoring while on pain pump. House [facility], Misha at bedside trying to redirect patient as well as security.

## 2017-07-17 NOTE — Progress Notes (Signed)
Pt cursing at RN and tech stating that he is in pain and is tired of all the cords hanging around him. Pt states that he has been wearing oxygen and doesn't understand why the pain meds aren't working. This RN explained that PCA pump was turned off on previous shift d/t him not wearing appropriate monitoring equipment. Pt unwilling to listen to RN, stating that he wants IV pain meds when he wants them and is tired of all the staff not giving him what he wants. Raliegh Ip Schorr, floor coverage paged to address pain control.

## 2017-07-17 NOTE — Progress Notes (Addendum)
Patient request a warm blanket writer attempted to place warm blanket on patient . Patient states "don't touch my blankets." . Writer explained to patient that he could feel the warmth of the  blanket better If it was next to skin. Patient yells, "  I know let me fix the other blankets.".  Patient then states" I don't want the  blanket now it's not hot" and threw blanket on floor. Patient told writer to go get him another blanket.  Writer explained to patient blanket was warm when it was offered to you initially and that she would not be going to get another warm blanket at this time. Patient requested to speak to charge nurse and charge nurse notified.

## 2017-07-17 NOTE — Progress Notes (Addendum)
CN arrived to patients room due to IV/PCA pump alarming. Patient began to shout and stated "This pump keeps beeping". Discussed use of PCA pump and policy for [place] while in use of PCA. Pt was proved education- O2/ECO2 has to be monitored requiring use of pulse ox and oxygen tubing. Pt reports not having experienced this in the past at a different facility. Pt  made aware if unable to wear, PCA pump would be turned off. Pt has refused to comply with the policy for PCA use, pump turned off. Primary RN updated and primary RN  paged provider to update. Will continue to monitor and follow up.

## 2017-07-18 MED ORDER — PROMETHAZINE HCL 25 MG/ML IJ SOLN: 12.5000 mg | Freq: Four times a day (QID) | INTRAMUSCULAR | Status: DC | PRN

## 2017-07-18 NOTE — Plan of Care (Signed)
RN attempted to educate patient on plan of care, specifically pain control measures, safety precautions, IS use, and importance of notifying RN with questions or concerns. However, pt verbally aggressive and noncompliant with safety measures. Pt will need ongoing education until he appropriately verbalizes understanding of teaching and is more compliant.

## 2017-07-18 NOTE — Progress Notes (Signed)
Pt wanted to sign out AMA. Risks were discussed with pt and pt stated understanding. IV removed. AMA form signed. Belongings were returned from security. Nevada Crane MD was made aware. Pt wheeled down to main entrance by Nursing staff.

## 2017-07-18 NOTE — Progress Notes (Signed)
Patient ID: Timothy Marks, male   DOB: [birth year], 27 y.o.   MRN: [id number] Subjective:  Patient said to be violating the PCA policies by not wearing his oxygen nasal cannula. He is verbally abusive to staff (advised to report in [facility]). Patient has no new complaint.   Objective:  Vital signs in last 24 hours:  Vitals:   07/18/17 0613 07/18/17 0951 07/18/17 1227 07/18/17 1632  BP:  (!) 112/59    Pulse:  93    Resp: 14 12 14 14   Temp:  98.3 F (36.8 C)    TempSrc:  Oral    SpO2: 96% 98% 98% 99%  Weight:      Height:       Intake/Output from previous day:   Intake/Output Summary (Last 24 hours) at 07/18/2017 1657 Last data filed at 07/18/2017 0952 Gross per 24 hour  Intake 690 ml  Output 3025 ml  Net -2335 ml    Physical Exam: General: Alert, awake, oriented x3, in no acute distress.  HEENT: [place]/AT PEERL, EOMI Neck: Trachea midline,  no masses, no thyromegal,y no JVD, no carotid bruit OROPHARYNX:  Moist, No exudate/ erythema/lesions.  Heart: Regular rate and rhythm, without murmurs, rubs, gallops, PMI non-displaced, no heaves or thrills on palpation.  Lungs: Clear to auscultation, no wheezing or rhonchi noted. No increased vocal fremitus resonant to percussion  Abdomen: Soft, nontender, nondistended, positive bowel sounds, no masses no hepatosplenomegaly noted..  Neuro: No focal neurological deficits noted cranial nerves II through XII grossly intact. DTRs 2+ bilaterally upper and lower extremities. Strength 5 out of 5 in bilateral upper and lower extremities. Musculoskeletal: No warm swelling or erythema around joints, no spinal tenderness noted. Psychiatric: Patient alert and oriented x3, good insight and cognition, good recent to remote recall. Lymph node survey: No cervical axillary or inguinal lymphadenopathy noted.  Lab Results:  Basic Metabolic Panel:    Component Value Date/Time   NA 138 07/17/2017 1211   NA 142 05/01/2017 0937   K 3.7 07/17/2017 1211   CL 106  07/17/2017 1211   CO2 25 07/17/2017 1211   BUN 6 07/17/2017 1211   BUN 4 (L) 05/01/2017 0937   CREATININE 0.50 (L) 07/17/2017 1211   GLUCOSE 93 07/17/2017 1211   CALCIUM 8.7 (L) 07/17/2017 1211   CBC:    Component Value Date/Time   WBC 14.7 (H) 07/17/2017 1211   HGB 9.3 (L) 07/17/2017 1211   HGB 11.2 (L) 06/01/2017 1011   HCT 26.6 (L) 07/17/2017 1211   HCT 32.6 (L) 06/01/2017 1011   PLT 345 07/17/2017 1211   PLT 559 (H) 06/01/2017 1011   MCV 87.2 07/17/2017 1211   MCV 81 06/01/2017 1011   NEUTROABS 9.3 (H) 07/17/2017 1211   NEUTROABS 5.2 06/01/2017 1011   LYMPHSABS 4.0 07/17/2017 1211   LYMPHSABS 1.7 06/01/2017 1011   MONOABS 1.3 (H) 07/17/2017 1211   EOSABS 0.1 07/17/2017 1211   EOSABS 0.1 06/01/2017 1011   BASOSABS 0.0 07/17/2017 1211   BASOSABS 0.0 06/01/2017 1011    No results found for this or any previous visit (from the past 240 hour(s)).  Studies/Results: Dg Chest 2 View  Result Date: 07/16/2017 CLINICAL DATA:  Pain. Sickle cell patient with right arm and back pain. EXAM: CHEST - 2 VIEW COMPARISON:  Radiographs 05/18/2017 FINDINGS: The cardiomediastinal contours are normal. The lungs are clear. Pulmonary vasculature is normal. No consolidation, pleural effusion, or pneumothorax. Osseous findings of sickle cell with H-shaped vertebra and scleroses. No acute osseous abnormalities  are seen. IMPRESSION: No acute pulmonary process. Electronically Signed   By: Jeb Levering M.D.   On: 07/16/2017 20:22    Medications: Scheduled Meds: . cholecalciferol  2,000 Units Oral Daily  . enoxaparin (LOVENOX) injection  40 mg Subcutaneous Q24H  . folic acid  1 mg Oral Daily  . HYDROmorphone   Intravenous Q4H  . hydroxyurea  500 mg Oral BID  . potassium chloride SA  20 mEq Oral Daily  . senna-docusate  1 tablet Oral BID  . [START ON 07/19/2017] Vitamin D (Ergocalciferol)  50,000 Units Oral Q Wed   Continuous Infusions: . diphenhydrAMINE Stopped (07/17/17 0522)   PRN  Meds:.diphenhydrAMINE **OR** diphenhydrAMINE, naloxone **AND** sodium chloride flush, ondansetron (ZOFRAN) IV, oxyCODONE-acetaminophen **AND** oxyCODONE, polyethylene glycol, promethazine  Assessment/Plan: Principal Problem:   Sickle cell crisis (HCC) Active Problems:   Hypokalemia   Sickle cell anemia (HCC)   Pain  1. Hb Sickle Cell Disease with crisis: KVO, continue weight based Dilaudid PCA, IV Toradol 30 mg Q 6 H, Monitor vitals very closely, Re-evaluate pain scale regularly, continue the use 2 L of Oxygen by [place], patient educated about the need to wear [place] to monitor oxygenation and the danger of doing otherwise. Patient did not agree with the logics. 2. Leukocytosis: Reactive, no need for antibiotics 3. Sickle Cell Anemia: Hb at baseline. No indication for blood transfusion. 4. Chronic pain Syndrome: Home pain medications  Code Status: Full Code Family Communication: N/A Disposition Plan: Not yet ready for discharge  Manuella Blackson  If 7PM-7AM, please contact night-coverage.  07/18/2017, 4:57 PM  LOS: 2 days

## 2017-07-19 NOTE — Discharge Summary (Signed)
Physician Discharge Summary  Timothy Marks EGB:[id number] DOB: Mar 25, 1990 DOA: 07/16/2017  PCP: Dorena Dew, FNP  Admit date: 07/16/2017  Discharge date: Left AMA on 07/18/2017   Discharge Diagnoses:  Principal Problem:   Sickle cell crisis ([place]) Active Problems:   Hypokalemia   Sickle cell anemia (HCC)   Pain  Discharge Condition: Stable  Disposition:   Left AMA  Diet: Regular Wt Readings from Last 3 Encounters:  07/16/17 61.2 kg (135 lb)  06/03/17 56.2 kg (124 lb)  06/01/17 56.6 kg (124 lb 12.8 oz)    History of present illness: Timothy Marks  is a 27 y.o. male, w sickle cell apparently c/o generalized pain in bilateral arms and legs and lower back c/w his sickle cell pain.  Pt states the pain started yesterday.  Doesn't believe that dehydration is an issue in trigger.  Ran out of medication a few days ago  In ED, pt tx with dilaudid iv x2 without relief per ED.  CXR  IMPRESSION: No acute pulmonary process.  Na 134, K 3.2,  Bun 5, creatinine 0.74 Ast 26, Alt 12 Alk phos 68, T. Bili 3.9  Wbc 11.1, Hgb 11.4, Plt 422  retic 6.5  Pt will be admitted for sickle cell crisis.   Hospital Course:  Patient was admitted for sickle cell pain crisis and managed appropriately with IVF, IV Dilaudid via PCA and IV Toradol, as well as other adjunct therapies per sickle cell pain management protocols. He did not comply with PCA policy so PCA was intermittently turned off. He was paranoid, refused to be touched, was difficult to care for, removed cardiac monitor stating that he is not wearing it or anything else for that matter. He removed oxygen/CO2 sensor from his nose despite repeated education about Dilaudid PCA and requirements for monitoring while on pain pump.   Patient Left the Hospital AMA before treatment could be completed.   Discharge Exam: Left AMA  Discharge Instructions    Allergies as of 07/18/2017      Reactions   Buprenorphine Hcl Hives   Levaquin  [levofloxacin] Itching   Meperidine Rash   Morphine And Related Hives   Toradol [ketorolac Tromethamine] Itching   Tramadol Hives   Vancomycin Itching   Zosyn [piperacillin Sod-tazobactam So] Itching, Rash   Has taken rocephin in past      Medication List    ASK your doctor about these medications   aspirin EC 81 MG tablet Take 1 tablet (81 mg total) by mouth daily.   cholecalciferol 1000 units tablet Commonly known as:  VITAMIN D Take 2 tablets (2,000 Units total) by mouth daily.   folic acid 1 MG tablet Commonly known as:  FOLVITE Take 1 tablet (1 mg total) by mouth daily.   hydroxyurea 500 MG capsule Commonly known as:  HYDREA Take 1 capsule (500 mg total) by mouth 2 (two) times daily. May take with food to minimize GI side effects.   ibuprofen 800 MG tablet Commonly known as:  ADVIL,MOTRIN Take 1 tablet (800 mg total) by mouth 3 (three) times daily.   oxyCODONE-acetaminophen 10-325 MG tablet Commonly known as:  PERCOCET Take 1 tablet by mouth every 6 (six) hours as needed for up to 15 days for pain.   potassium chloride SA 20 MEQ tablet Commonly known as:  K-DUR,KLOR-CON Take 2 tablets (40 mEq total) by mouth daily.   Vitamin D (Ergocalciferol) 50000 units Caps capsule Commonly known as:  DRISDOL Take 1 capsule (50,000 Units total) by mouth  every 7 (seven) days.       The results of significant diagnostics from this hospitalization (including imaging, microbiology, ancillary and laboratory) are listed below for reference.    Significant Diagnostic Studies: Dg Chest 2 View  Result Date: 07/16/2017 CLINICAL DATA:  Pain. Sickle cell patient with right arm and back pain. EXAM: CHEST - 2 VIEW COMPARISON:  Radiographs 05/18/2017 FINDINGS: The cardiomediastinal contours are normal. The lungs are clear. Pulmonary vasculature is normal. No consolidation, pleural effusion, or pneumothorax. Osseous findings of sickle cell with H-shaped vertebra and scleroses. No acute  osseous abnormalities are seen. IMPRESSION: No acute pulmonary process. Electronically Signed   By: Melanie  Ehinger M.D.   On: 07/16/2017 20:22    Microbiology: No results found for this or any previous visit (from the past 240 hour(s)).   Labs: Basic Metabolic Panel: Recent Labs  Lab 07/16/17 1919 07/17/17 1211  NA 134* 138  K 3.2* 3.7  CL 99* 106  CO2 27 25  GLUCOSE 93 93  BUN 5* 6  CREATININE 0.74 0.50*  CALCIUM 8.9 8.7*   Liver Function Tests: Recent Labs  Lab 07/16/17 1919 07/17/17 1211  AST 26 49*  ALT 12* 20  ALKPHOS 68 63  BILITOT 3.9* 5.8*  PROT 7.7 7.0  ALBUMIN 4.3 3.9   No results for input(s): LIPASE, AMYLASE in the last 168 hours. No results for input(s): AMMONIA in the last 168 hours. CBC: Recent Labs  Lab 07/16/17 1919 07/17/17 1211  WBC 11.1* 14.7*  NEUTROABS 7.3 9.3*  HGB 11.4* 9.3*  HCT 33.4* 26.6*  MCV 86.1 87.2  PLT 422* 345   Cardiac Enzymes: No results for input(s): CKTOTAL, CKMB, CKMBINDEX, TROPONINI in the last 168 hours. BNP: Invalid input(s): POCBNP CBG: No results for input(s): GLUCAP in the last 168 hours.  Time coordinating discharge: 50 minutes  Signed:     [facility] 07/19/2017, 6:23 PM  

## 2017-07-19 NOTE — Care Management Note (Signed)
Case Management Note  Patient Details  Name: Timothy Marks MRN: [id number] Date of Birth: March 09, 1990  Subjective/Objective:     Pt admitted with Sickle Cell pain               Action/Plan: Pt left AMA   Expected Discharge Date:  07/19/17               Expected Discharge Plan:  Against Medical Advice  In-House Referral:     Discharge planning Services  CM Consult  Post Acute Care Choice:    Choice offered to:     DME Arranged:    DME Agency:     HH Arranged:    [place] Agency:     Status of Service:  In process, will continue to follow  If discussed at Long Length of Stay Meetings, dates discussed:    Additional CommentsPurcell Mouton, RN 07/19/2017, 11:34 AM

## 2017-07-24 ENCOUNTER — Telehealth: Payer: Self-pay

## 2017-07-24 ENCOUNTER — Other Ambulatory Visit: Payer: Self-pay | Admitting: Family Medicine

## 2017-07-24 DIAGNOSIS — Z79891 Long term (current) use of opiate analgesic: Secondary | ICD-10-CM

## 2017-07-24 DIAGNOSIS — D571 Sickle-cell disease without crisis: Secondary | ICD-10-CM

## 2017-07-24 MED ORDER — OXYCODONE-ACETAMINOPHEN 10-325 MG PO TABS: 1.0000 | ORAL_TABLET | Freq: Four times a day (QID) | ORAL | 0 refills | Status: DC | PRN

## 2017-07-24 NOTE — Progress Notes (Signed)
Reviewed [place] Substance Reporting system prior to prescribing opiate medications. No inconsistencies noted.   Meds ordered this encounter  Medications  . oxyCODONE-acetaminophen (PERCOCET) 10-325 MG tablet    Sig: Take 1 tablet by mouth every 6 (six) hours as needed for up to 15 days for pain.    Dispense:  60 tablet    Refill:  0    Rx not to be filled prior to 07/27/2017    Order Specific Question:   Supervising Provider    Answer:   Tresa Garter [[id number]]     [name]  MSN, FNP-C [facility] [address] [place], [place] [address] [phone]

## 2017-07-25 ENCOUNTER — Telehealth: Payer: Self-pay

## 2017-07-26 ENCOUNTER — Other Ambulatory Visit: Payer: Self-pay | Admitting: Internal Medicine

## 2017-07-26 ENCOUNTER — Other Ambulatory Visit: Payer: Self-pay

## 2017-07-26 NOTE — Telephone Encounter (Signed)
Medication has been refilled.

## 2017-08-04 ENCOUNTER — Encounter: Payer: Self-pay | Admitting: Family Medicine

## 2017-08-04 ENCOUNTER — Ambulatory Visit (INDEPENDENT_AMBULATORY_CARE_PROVIDER_SITE_OTHER): Payer: Self-pay | Admitting: Family Medicine

## 2017-08-04 VITALS — BP 116/73 | HR 57 | Temp 98.0°F | Resp 14 | Ht 74.0 in | Wt 117.0 lb

## 2017-08-04 DIAGNOSIS — D571 Sickle-cell disease without crisis: Secondary | ICD-10-CM

## 2017-08-04 DIAGNOSIS — E559 Vitamin D deficiency, unspecified: Secondary | ICD-10-CM

## 2017-08-04 NOTE — Patient Instructions (Signed)
Sickle Cell Anemia, Adult °Sickle cell anemia is a condition where your red blood cells are shaped like sickles. Red blood cells carry oxygen through the body. Sickle-shaped red blood cells do not live as long as normal red blood cells. They also clump together and block blood from flowing through the blood vessels. These things prevent the body from getting enough oxygen. Sickle cell anemia causes organ damage and pain. It also increases the risk of infection. °Follow these instructions at home: °· Drink enough fluid to keep your pee (urine) clear or pale yellow. Drink more in hot weather and during exercise. °· Do not smoke. Smoking lowers oxygen levels in the blood. °· Only take over-the-counter or prescription medicines as told by your doctor. °· Take antibiotic medicines as told by your doctor. Make sure you finish them even if you start to feel better. °· Take supplements as told by your doctor. °· Consider wearing a medical alert bracelet. This tells anyone caring for you in an emergency of your condition. °· When traveling, keep your medical information, doctors' names, and the medicines you take with you at all times. °· If you have a fever, do not take fever medicines right away. This could cover up a problem. Tell your doctor. °· Keep all follow-up visits with your doctor. Sickle cell anemia requires regular medical care. °Contact a doctor if: °You have a fever. °Get help right away if: °· You feel dizzy or faint. °· You have new belly (abdominal) pain, especially on the left side near the stomach area. °· You have a lasting, often uncomfortable and painful erection of the penis (priapism). If it is not treated right away, you will become unable to have sex (impotence). °· You have numbness in your arms or legs or you have a hard time moving them. °· You have a hard time talking. °· You have a fever or lasting symptoms for more than 2-3 days. °· You have a fever and your symptoms suddenly get  worse. °· You have signs or symptoms of infection. These include: °? Chills. °? Being more tired than normal (lethargy). °? Irritability. °? Poor eating. °? Throwing up (vomiting). °· You have pain that is not helped with medicine. °· You have shortness of breath. °· You have pain in your chest. °· You are coughing up pus-like or bloody mucus. °· You have a stiff neck. °· Your feet or hands swell or have pain. °· Your belly looks bloated. °· Your joints hurt. °This information is not intended to replace advice given to you by your health care provider. Make sure you discuss any questions you have with your health care provider. °Document Released: 11/21/2012 Document Revised: 07/09/2015 Document Reviewed: 09/12/2012 °Elsevier Interactive Patient Education © 2017 Elsevier Inc. ° °

## 2017-08-04 NOTE — Progress Notes (Signed)
Chief Complaint  Patient presents with  . Sickle Cell Anemia  . Medication Refill    pain medication      Subjective:    Patient ID: Timothy Marks, male    DOB: [birth year], 27 y.o.   MRN: [id number]  HPI Timothy Marks, a 27 year old male with a history of sickle cell anemia, HbSS presents for a 1 month follow up and medication management. Patient continues to have pain to lower back. Pain intensity is 8/10 characterized as intermittent and throbbing. He takes Oxycodone 10-325 mg every 6 hours and Ibuprofen every 8 hours as needed for chronic pain.  Patient takes folic acid and hydroxyurea consisently. He denies depression, fatigue, shortness of breath, chest pain, dysuria, nausea, vomiting, or diarrhea.  Past Medical History:  Diagnosis Date  . Depression   . 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 Sickle cell anemia (HCC)   . Vitamin D deficiency y-6   Social History   Socioeconomic History  . Marital status: Single    Spouse name: Not on file  . Number of children: Not on file  . Years of education: Not on file  . Highest education level: Not on file  Occupational History  . Not on file  Social Needs  . Financial resource strain: Not on file  . Food insecurity:    Worry: Not on file    Inability: Not on file  . Transportation needs:    Medical: Not on file    Non-medical: Not on file  Tobacco Use  . Smoking status: Never Smoker  . Smokeless tobacco: Never Used  Substance and Sexual Activity  . Alcohol use: No  . Drug use: No    Types: Marijuana    Comment: denies use 05/01/2017  . Sexual activity: Not on file  Lifestyle  . Physical activity:    Days per week: Not on file    Minutes per session: Not on file  . Stress: Not on file  Relationships  . Social connections:    Talks on phone: Not on file    Gets together: Not on file    Attends religious service: Not on file    Active member of club or  organization: Not on file    Attends meetings of clubs or organizations: Not on file    Relationship status: Not on file  . Intimate partner violence:    Fear of current or ex partner: Not on file    Emotionally abused: Not on file    Physically abused: Not on file    Forced sexual activity: Not on file  Other Topics Concern  . Not on file  Social History Narrative  . Not on file   Immunization History  Administered Date(s) Administered  . DTaP 12/08/1988, 02/02/1989, 04/03/1989, 03/19/1990, 07/09/1993  . Hepatitis A 10/05/1999  . Hepatitis B 07/28/1994, 09/02/1994, 08/19/1999  . HiB (PRP-OMP) 04/03/1989, 05/29/1989, 07/24/1989, 01/06/1990  . IPV 12/08/1988, 02/02/1989, 03/19/1990, 07/09/1993  . Influenza,inj,Quad PF,6+ Mos 03/04/2013, 04/01/2014  . MMR 01/06/1990, 07/09/1993  . Pneumococcal Conjugate-13 10/07/1991  . Pneumococcal Polysaccharide-23 03/04/2013   Review of Systems  Constitutional: Negative.   HENT: Negative.  Negative for dental problem and drooling.   Eyes: Negative.   Respiratory: Negative.   Cardiovascular: Negative.   Gastrointestinal: Negative.   Endocrine: Negative for polydipsia, polyphagia and polyuria.  Genitourinary: Negative.   Musculoskeletal: Positive for arthralgias and back pain.  Skin: Negative.  Allergic/Immunologic: Negative for immunocompromised state.  Neurological: Negative.   Hematological: Negative.   Psychiatric/Behavioral: Negative.       Objective:   Physical Exam  HENT:  Head: Normocephalic and atraumatic.  Right Ear: External ear normal.  Left Ear: External ear normal.  Nose: Nose normal.  Mouth/Throat: Oropharynx is clear and moist.  Eyes: Pupils are equal, round, and reactive to light. Conjunctivae are normal.  Neck: Normal range of motion. Neck supple.  Pulmonary/Chest: Effort normal and breath sounds normal.  Abdominal: Soft. Bowel sounds are normal.  Neurological: He is alert.  Skin: Skin is warm and dry.   Psychiatric: He has a normal mood and affect. His behavior is normal. Judgment and thought content normal.      BP 116/73 (BP Location: Left Arm, Patient Position: Sitting, Cuff Size: Normal)   Pulse (!) 57   Temp 98 F (36.7 C) (Oral)   Resp 14   Ht _0  (1.88 m)   Wt 117 lb (53.1 kg)   SpO2 100%   BMI 15.02 kg/m  Assessment & Plan:  Hb-SS disease without crisis (HCC) Continue Hydrea 1000 mg daily. Will check CBC for absolute neutrophil count and platelets. Will also check reticulocyte count. Will consider increasing dosage. We discussed the need for good hydration, monitoring of hydration status, avoidance of heat, cold, stress, and infection triggers. We discussed the risks and benefits of Hydrea, including bone marrow suppression, the possibility of GI upset, skin ulcers, hair thinning, and teratogenicity. Reminded Mr. Pangborn of the needed to use contraception while on hydroxyurea due to teratogenicity. The patient was reminded of the need to seek medical attention of any symptoms of bleeding, anemia, or infection. Will start folic acid 1 mg daily to prevent aplastic bone marrow crises.   Pulmonary evaluation - Patient denies severe recurrent wheezes, shortness of breath with exercise, or persistent cough. If these symptoms develop, pulmonary function tests with spirometry will be ordered, and if abnormal, plan on referral to Pulmonology for further evaluation.  Cardiac - Routine screening for pulmonary hypertension is not recommended.  Eye - High risk of proliferative retinopathy. Annual eye exam with retinal exam recommended to patient. Last eye examination 6 months ago  Acute and chronic painful episodes - We agreed on monthly regimen.   - Comprehensive metabolic panel - CBC with Differential - 021115 9+OXYCODONE+CRT-UNBUND  Vitamin D deficiency  - Vitamin D, 25-hydroxy   RTC: Sickle cell anemia and medication management     [name]  MSN, FNP-C [name] [address] [place], [place] [address] [phone]

## 2017-08-05 LAB — CBC WITH DIFFERENTIAL/PLATELET
Basophils Absolute: 0 10*3/uL (ref 0.0–0.2)
Basos: 1 %
EOS (ABSOLUTE): 0 10*3/uL (ref 0.0–0.4)
Eos: 1 %
Hematocrit: 36.4 % — ABNORMAL LOW (ref 37.5–51.0)
Hemoglobin: 11.5 g/dL — ABNORMAL LOW (ref 13.0–17.7)
Immature Grans (Abs): 0 10*3/uL (ref 0.0–0.1)
Immature Granulocytes: 0 %
Lymphocytes Absolute: 2.1 10*3/uL (ref 0.7–3.1)
Lymphs: 32 %
MCH: 29.6 pg (ref 26.6–33.0)
MCHC: 31.6 g/dL (ref 31.5–35.7)
MCV: 94 fL (ref 79–97)
Monocytes Absolute: 0.5 10*3/uL (ref 0.1–0.9)
Monocytes: 8 %
NRBC: 3 % — ABNORMAL HIGH (ref 0–0)
Neutrophils Absolute: 3.9 10*3/uL (ref 1.4–7.0)
Neutrophils: 58 %
Platelets: 279 10*3/uL (ref 150–450)
RBC: 3.88 x10E6/uL — ABNORMAL LOW (ref 4.14–5.80)
RDW: 17.8 % — ABNORMAL HIGH (ref 12.3–15.4)
WBC: 6.6 10*3/uL (ref 3.4–10.8)

## 2017-08-05 LAB — COMPREHENSIVE METABOLIC PANEL
ALT: 9 IU/L (ref 0–44)
AST: 20 IU/L (ref 0–40)
Albumin/Globulin Ratio: 1.5 (ref 1.2–2.2)
Albumin: 4.8 g/dL (ref 3.5–5.5)
Alkaline Phosphatase: 79 IU/L (ref 39–117)
BUN/Creatinine Ratio: 9 (ref 9–20)
BUN: 6 mg/dL (ref 6–20)
Bilirubin Total: 1.5 mg/dL — ABNORMAL HIGH (ref 0.0–1.2)
CO2: 21 mmol/L (ref 20–29)
Calcium: 9.9 mg/dL (ref 8.7–10.2)
Chloride: 103 mmol/L (ref 96–106)
Creatinine, Ser: 0.66 mg/dL — ABNORMAL LOW (ref 0.76–1.27)
GFR calc Af Amer: 152 mL/min/{1.73_m2} (ref >59)
GFR calc non Af Amer: 132 mL/min/{1.73_m2} (ref >59)
Globulin, Total: 3.1 g/dL (ref 1.5–4.5)
Glucose: 77 mg/dL (ref 65–99)
Potassium: 4.4 mmol/L (ref 3.5–5.2)
Sodium: 140 mmol/L (ref 134–144)
Total Protein: 7.9 g/dL (ref 6.0–8.5)

## 2017-08-05 LAB — MED LIST OPTION NOT SELECTED

## 2017-08-05 LAB — VITAMIN D 25 HYDROXY (VIT D DEFICIENCY, FRACTURES): Vit D, 25-Hydroxy: 21.9 ng/mL — ABNORMAL LOW (ref 30.0–100.0)

## 2017-08-07 ENCOUNTER — Telehealth: Payer: Self-pay

## 2017-08-07 NOTE — Telephone Encounter (Signed)
Called. No answer. Left a message for patient that vitamin D remains decreased and that he will need to continue with weekly vitamin D. Advised that all other labs were consistent with baseline and to keep next scheduled follow up.

## 2017-08-07 NOTE — Telephone Encounter (Signed)
-----   Message from Dorena Dew, [place] sent at 08/05/2017  3:20 PM EDT ----- Regarding: lab results Please inform patient that vitamin D remains decreased, will continue weekly vitamin D.  All other labs consistent with baseline.   [name]  MSN, FNP-C [facility] [address] [place], [place] [address] [phone]

## 2017-08-08 ENCOUNTER — Other Ambulatory Visit: Payer: Self-pay | Admitting: Family Medicine

## 2017-08-08 DIAGNOSIS — D571 Sickle-cell disease without crisis: Secondary | ICD-10-CM

## 2017-08-08 DIAGNOSIS — Z79891 Long term (current) use of opiate analgesic: Secondary | ICD-10-CM

## 2017-08-08 MED ORDER — OXYCODONE-ACETAMINOPHEN 10-325 MG PO TABS: 1.0000 | ORAL_TABLET | Freq: Four times a day (QID) | ORAL | 0 refills | Status: DC | PRN

## 2017-08-08 NOTE — Progress Notes (Signed)
Reviewed [facility] Substance Reporting system prior to prescribing opiate medications. Inconsistencies noted. Notified patient, prescriptions received following hospital discharge. Patient will sign    Acute and chronic painful episodes - We agreed on Percocet 10-325 mg every 6 hours as needed. Discussed that pt is to receive his Schedule II prescriptions only from the [facility]. Pt is also aware that the prescription history is available to [place] online through the [facility] PMP Aware. Controlled substance agreement signed 08/08/2017. We reminded Manjinder that all patients receiving Schedule II narcotics must be seen for follow within one month of prescription being requested. We reviewed the terms of our pain agreement, including the need to keep medicines in a safe locked location away from children or pets, and the need to report excess sedation or constipation, measures to avoid constipation, and policies related to early refills and stolen prescriptions. According to the [place] Chronic Pain Initiative program, we have reviewed details related to analgesia, adverse effects, aberrant behaviors.   [name]  MSN, FNP-C [facility] [address] [place], [place] [address] [phone]

## 2017-08-09 LAB — 737588 9+OXYCODONE+CRT-UNBUND
Amphetamine Scrn, Ur: NEGATIVE ng/mL
BARBITURATE SCREEN URINE: NEGATIVE ng/mL
BENZODIAZEPINE SCREEN, URINE: NEGATIVE ng/mL
Cocaine (Metab) Scrn, Ur: NEGATIVE ng/mL
Creatinine(Crt), U: 89.8 mg/dL (ref 20.0–300.0)
Methadone Screen, Urine: NEGATIVE ng/mL
OXYCODONE+OXYMORPHONE UR QL SCN: NEGATIVE ng/mL
Opiate Scrn, Ur: NEGATIVE ng/mL
Ph of Urine: 7.8 (ref 4.5–8.9)
Phencyclidine Qn, Ur: NEGATIVE ng/mL
Propoxyphene Scrn, Ur: NEGATIVE ng/mL

## 2017-08-09 LAB — CANNABINOID (GC/MS), URINE
Cannabinoid: POSITIVE — AB
Carboxy THC (GC/MS): 318 ng/mL

## 2017-08-21 ENCOUNTER — Telehealth: Payer: Self-pay

## 2017-08-21 ENCOUNTER — Other Ambulatory Visit: Payer: Self-pay | Admitting: Family Medicine

## 2017-08-21 DIAGNOSIS — Z79891 Long term (current) use of opiate analgesic: Secondary | ICD-10-CM

## 2017-08-21 DIAGNOSIS — D571 Sickle-cell disease without crisis: Secondary | ICD-10-CM

## 2017-08-21 MED ORDER — OXYCODONE-ACETAMINOPHEN 10-325 MG PO TABS: 1.0000 | ORAL_TABLET | Freq: Four times a day (QID) | ORAL | 0 refills | Status: DC | PRN

## 2017-08-21 NOTE — Progress Notes (Signed)
Reviewed [facility] Substance Reporting System.  last filled 08/08/2017. Last UDS + cannabinoid 08/04/2017 and negative for medication. Patient with multiple prescribers. Called patient and informed him that this provider will not prescribe medication at the present time. Offered patient referral to pain management. Patient declined. Patient stated that he will have to find another doctor. Requesting to speak to L.Smith Robert, NP directly. Informed patient that a message will be sent.

## 2017-08-22 ENCOUNTER — Other Ambulatory Visit: Payer: Self-pay | Admitting: Internal Medicine

## 2017-08-22 DIAGNOSIS — D571 Sickle-cell disease without crisis: Secondary | ICD-10-CM

## 2017-08-22 DIAGNOSIS — Z79891 Long term (current) use of opiate analgesic: Secondary | ICD-10-CM

## 2017-08-22 MED ORDER — OXYCODONE-ACETAMINOPHEN 10-325 MG PO TABS: 1.0000 | ORAL_TABLET | Freq: Four times a day (QID) | ORAL | 0 refills | Status: DC | PRN

## 2017-08-22 NOTE — Telephone Encounter (Signed)
Refilled

## 2017-08-30 DIAGNOSIS — Z765 Malingerer [conscious simulation]: Secondary | ICD-10-CM | POA: Insufficient documentation

## 2017-08-31 ENCOUNTER — Ambulatory Visit: Payer: Self-pay | Admitting: Family Medicine

## 2017-09-05 ENCOUNTER — Telehealth: Payer: Self-pay

## 2017-09-05 ENCOUNTER — Other Ambulatory Visit: Payer: Self-pay | Admitting: Internal Medicine

## 2017-09-05 DIAGNOSIS — D571 Sickle-cell disease without crisis: Secondary | ICD-10-CM

## 2017-09-05 DIAGNOSIS — Z79891 Long term (current) use of opiate analgesic: Secondary | ICD-10-CM

## 2017-09-05 MED ORDER — OXYCODONE-ACETAMINOPHEN 10-325 MG PO TABS: 1.0000 | ORAL_TABLET | Freq: Four times a day (QID) | ORAL | 0 refills | Status: DC | PRN

## 2017-09-05 NOTE — Telephone Encounter (Signed)
Refilled

## 2017-09-13 ENCOUNTER — Encounter: Payer: Self-pay | Admitting: Family Medicine

## 2017-09-13 ENCOUNTER — Ambulatory Visit (INDEPENDENT_AMBULATORY_CARE_PROVIDER_SITE_OTHER): Payer: Self-pay | Admitting: Family Medicine

## 2017-09-13 VITALS — BP 118/62 | HR 100 | Temp 98.5°F | Resp 16 | Ht 74.0 in | Wt 119.0 lb

## 2017-09-13 DIAGNOSIS — E559 Vitamin D deficiency, unspecified: Secondary | ICD-10-CM

## 2017-09-13 DIAGNOSIS — Z79891 Long term (current) use of opiate analgesic: Secondary | ICD-10-CM

## 2017-09-13 DIAGNOSIS — D571 Sickle-cell disease without crisis: Secondary | ICD-10-CM

## 2017-09-13 MED ORDER — VITAMIN D (ERGOCALCIFEROL) 1.25 MG (50000 UNIT) PO CAPS: 50000.0000 [IU] | ORAL_CAPSULE | ORAL | 1 refills | Status: DC

## 2017-09-13 NOTE — Progress Notes (Signed)
Subjective   Timothy Marks 27 y.o. male  [id number]  [id number]  1990/05/02 27 y.o.     Chief Complaint  Patient presents with  . Sickle Cell Anemia  . Hospitalization Follow-up    Patient presents for hospital follow up for sickle cell crisis. Patient states that he received a refill on 09/05/2017. Patient states that he is out of medication. Has not been taking them as prescribed. Requesting refill today. Patient states that he is not taking ibuprofen due to "I heard it will mess up my kidneys". Patient has been taking 2 percocets at a time. Patient not taking hydrea as prescribed BID.    Past Medical History:  Diagnosis Date  . Depression   . 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 Sickle cell anemia (HCC)   . Vitamin D deficiency y-6    Social History   Socioeconomic History  . Marital status: Single    Spouse name: Not on file  . Number of children: Not on file  . Years of education: Not on file  . Highest education level: Not on file  Occupational History  . Not on file  Social Needs  . Financial resource strain: Not on file  . Food insecurity:    Worry: Not on file    Inability: Not on file  . Transportation needs:    Medical: Not on file    Non-medical: Not on file  Tobacco Use  . Smoking status: Never Smoker  . Smokeless tobacco: Never Used  Substance and Sexual Activity  . Alcohol use: No  . Drug use: No    Types: Marijuana    Comment: denies use 05/01/2017  . Sexual activity: Not on file  Lifestyle  . Physical activity:    Days per week: Not on file    Minutes per session: Not on file  . Stress: Not on file  Relationships  . Social connections:    Talks on phone: Not on file    Gets together: Not on file    Attends religious service: Not on file    Active member of club or organization: Not on file    Attends meetings of clubs or organizations: Not on file    Relationship  status: Not on file  . Intimate partner violence:    Fear of current or ex partner: Not on file    Emotionally abused: Not on file    Physically abused: Not on file    Forced sexual activity: Not on file  Other Topics Concern  . Not on file  Social History Narrative  . Not on file      Review of Systems  Constitutional: Negative.   HENT: Negative.   Eyes: Negative.   Respiratory: Negative.   Cardiovascular: Negative.   Gastrointestinal: Negative.   Musculoskeletal: Positive for back pain, joint pain and myalgias.  Neurological: Negative.   Psychiatric/Behavioral: Negative.     Objective   Physical Exam  Constitutional: He is oriented to person, place, and time. He appears well-developed and well-nourished.  HENT:  Head: Normocephalic and atraumatic.  Mouth/Throat: Oropharynx is clear and moist.  Eyes: Pupils are equal, round, and reactive to light. Conjunctivae and EOM are normal.  Neck: Normal range of motion. Neck supple. No tracheal deviation present. No thyromegaly present.  Cardiovascular: Normal rate, regular rhythm, normal heart sounds and intact distal pulses. Exam reveals no friction rub.  No murmur heard. Pulmonary/Chest: Effort  normal and breath sounds normal. No stridor. No respiratory distress. He has no wheezes.  Abdominal: Soft. Bowel sounds are normal. He exhibits no distension and no mass. There is no tenderness.  Musculoskeletal: Normal range of motion.  Lymphadenopathy:    He has no cervical adenopathy.  Neurological: He is alert and oriented to person, place, and time.  Skin: Skin is warm and dry.  Psychiatric: He has a normal mood and affect. His behavior is normal. Judgment and thought content normal.  Nursing note and vitals reviewed.   BP 118/62 (BP Location: Left Arm, Patient Position: Sitting, Cuff Size: Normal)   Pulse 100   Temp 98.5 F (36.9 C) (Oral)   Resp 16   Ht 6\' 2"  (1.88 m)   Wt 119 lb (54 kg)   SpO2 100%   BMI 15.28 kg/m    Assessment   Encounter Diagnoses  Name Primary?  . Chronic prescription opiate use Yes  . Hb-SS disease without crisis ([place])      Plan  1. Chronic prescription opiate use Last UDS showed medication not in his system. Repeated today.  - [id number] 9+OXYCODONE+CRT-UNBUND  2. Hb-SS disease without crisis ([place]) No early refills will be given today. Encouraged the use of IBU and HYDREA daily as prescribed. CSA given in office to sign.  - [id number] 9+OXYCODONE+CRT-UNBUND - Basic Metabolic Panel - Hepatic Function Panel - CBC with Differential - Vitamin D, 25-hydroxy    The patient was given clear instructions to go to ER or return to medical center if symptoms do not improve, worsen or new problems develop. The patient verbalized understanding and agreed with plan of care.     This note has been created with Surveyor, quantity. Any transcriptional errors are unintentional.

## 2017-09-14 LAB — HEPATIC FUNCTION PANEL
ALT: 11 IU/L (ref 0–44)
AST: 24 IU/L (ref 0–40)
Albumin: 4.7 g/dL (ref 3.5–5.5)
Alkaline Phosphatase: 72 IU/L (ref 39–117)
Bilirubin Total: 1.7 mg/dL — ABNORMAL HIGH (ref 0.0–1.2)
Bilirubin, Direct: 0.46 mg/dL — ABNORMAL HIGH (ref 0.00–0.40)
Total Protein: 7.8 g/dL (ref 6.0–8.5)

## 2017-09-14 LAB — CBC WITH DIFFERENTIAL/PLATELET
Basophils Absolute: 0 10*3/uL (ref 0.0–0.2)
Basos: 0 %
EOS (ABSOLUTE): 0.1 10*3/uL (ref 0.0–0.4)
Eos: 1 %
Hematocrit: 32.7 % — ABNORMAL LOW (ref 37.5–51.0)
Hemoglobin: 10.6 g/dL — ABNORMAL LOW (ref 13.0–17.7)
Immature Grans (Abs): 0 10*3/uL (ref 0.0–0.1)
Immature Granulocytes: 0 %
Lymphocytes Absolute: 2.1 10*3/uL (ref 0.7–3.1)
Lymphs: 29 %
MCH: 30.3 pg (ref 26.6–33.0)
MCHC: 32.4 g/dL (ref 31.5–35.7)
MCV: 93 fL (ref 79–97)
Monocytes Absolute: 0.6 10*3/uL (ref 0.1–0.9)
Monocytes: 8 %
NRBC: 3 % — ABNORMAL HIGH (ref 0–0)
Neutrophils Absolute: 4.6 10*3/uL (ref 1.4–7.0)
Neutrophils: 62 %
Platelets: 526 10*3/uL — ABNORMAL HIGH (ref 150–450)
RBC: 3.5 x10E6/uL — ABNORMAL LOW (ref 4.14–5.80)
RDW: 18.3 % — ABNORMAL HIGH (ref 12.3–15.4)
WBC: 7.4 10*3/uL (ref 3.4–10.8)

## 2017-09-14 LAB — BASIC METABOLIC PANEL
BUN/Creatinine Ratio: 9 (ref 9–20)
BUN: 6 mg/dL (ref 6–20)
CO2: 21 mmol/L (ref 20–29)
Calcium: 9.2 mg/dL (ref 8.7–10.2)
Chloride: 105 mmol/L (ref 96–106)
Creatinine, Ser: 0.7 mg/dL — ABNORMAL LOW (ref 0.76–1.27)
GFR calc Af Amer: 148 mL/min/{1.73_m2} (ref >59)
GFR calc non Af Amer: 128 mL/min/{1.73_m2} (ref >59)
Glucose: 94 mg/dL (ref 65–99)
Potassium: 3.9 mmol/L (ref 3.5–5.2)
Sodium: 141 mmol/L (ref 134–144)

## 2017-09-14 LAB — VITAMIN D 25 HYDROXY (VIT D DEFICIENCY, FRACTURES): Vit D, 25-Hydroxy: 17.6 ng/mL — ABNORMAL LOW (ref 30.0–100.0)

## 2017-09-18 ENCOUNTER — Telehealth: Payer: Self-pay

## 2017-09-18 DIAGNOSIS — D571 Sickle-cell disease without crisis: Secondary | ICD-10-CM

## 2017-09-18 DIAGNOSIS — Z79891 Long term (current) use of opiate analgesic: Secondary | ICD-10-CM

## 2017-09-18 LAB — 737588 9+OXYCODONE+CRT-UNBUND
Amphetamine Scrn, Ur: NEGATIVE ng/mL
BARBITURATE SCREEN URINE: NEGATIVE ng/mL
BENZODIAZEPINE SCREEN, URINE: NEGATIVE ng/mL
Cocaine (Metab) Scrn, Ur: NEGATIVE ng/mL
Creatinine(Crt), U: 127 mg/dL (ref 20.0–300.0)
Methadone Screen, Urine: NEGATIVE ng/mL
Opiate Scrn, Ur: NEGATIVE ng/mL
Ph of Urine: 7 (ref 4.5–8.9)
Phencyclidine Qn, Ur: NEGATIVE ng/mL
Propoxyphene Scrn, Ur: NEGATIVE ng/mL

## 2017-09-18 LAB — OXYCODONE/OXYMORPHONE CONFIRM
OXYCODONE/OXYMORPH: POSITIVE — AB
OXYCODONE: 110 ng/mL
OXYCODONE: POSITIVE — AB
OXYMORPHONE: NEGATIVE

## 2017-09-18 LAB — CANNABINOID (GC/MS), URINE
Cannabinoid: POSITIVE — AB
Carboxy THC (GC/MS): >400 ng/mL

## 2017-09-18 MED ORDER — OXYCODONE-ACETAMINOPHEN 10-325 MG PO TABS
1.0000 | ORAL_TABLET | Freq: Four times a day (QID) | ORAL | 0 refills | Status: DC | PRN
Start: 2017-09-18 — End: 2017-10-03

## 2017-09-18 NOTE — Telephone Encounter (Signed)
Refilled and sent to [facility] in [facility].

## 2017-10-02 ENCOUNTER — Other Ambulatory Visit: Payer: Self-pay | Admitting: Internal Medicine

## 2017-10-02 ENCOUNTER — Telehealth: Payer: Self-pay

## 2017-10-03 ENCOUNTER — Other Ambulatory Visit: Payer: Self-pay | Admitting: Internal Medicine

## 2017-10-03 DIAGNOSIS — D571 Sickle-cell disease without crisis: Secondary | ICD-10-CM

## 2017-10-03 DIAGNOSIS — Z79891 Long term (current) use of opiate analgesic: Secondary | ICD-10-CM

## 2017-10-03 MED ORDER — OXYCODONE-ACETAMINOPHEN 10-325 MG PO TABS
1.0000 | ORAL_TABLET | Freq: Four times a day (QID) | ORAL | 0 refills | Status: DC | PRN
Start: 2017-10-03 — End: 2017-10-25

## 2017-10-03 NOTE — Telephone Encounter (Signed)
Refilled

## 2017-10-15 ENCOUNTER — Inpatient Hospital Stay (HOSPITAL_COMMUNITY)
Admission: EM | Admit: 2017-10-15 | Discharge: 2017-10-25 | DRG: 811 | Disposition: A | Discharge status: Home / Self Care | Payer: Medicaid Other | Attending: Family Medicine | Admitting: Family Medicine

## 2017-10-15 ENCOUNTER — Other Ambulatory Visit: Payer: Self-pay

## 2017-10-15 ENCOUNTER — Encounter (HOSPITAL_COMMUNITY): Payer: Self-pay | Admitting: Internal Medicine

## 2017-10-15 DIAGNOSIS — Z881 Allergy status to other antibiotic agents status: Secondary | ICD-10-CM | POA: Diagnosis not present

## 2017-10-15 DIAGNOSIS — Z79899 Other long term (current) drug therapy: Secondary | ICD-10-CM

## 2017-10-15 DIAGNOSIS — D5700 Hb-SS disease with crisis, unspecified: Secondary | ICD-10-CM | POA: Diagnosis present

## 2017-10-15 DIAGNOSIS — Z681 Body mass index (BMI) 19 or less, adult: Secondary | ICD-10-CM | POA: Diagnosis not present

## 2017-10-15 DIAGNOSIS — Z8481 Family history of carrier of genetic disease: Secondary | ICD-10-CM

## 2017-10-15 DIAGNOSIS — F1290 Cannabis use, unspecified, uncomplicated: Secondary | ICD-10-CM | POA: Diagnosis present

## 2017-10-15 DIAGNOSIS — G894 Chronic pain syndrome: Secondary | ICD-10-CM | POA: Diagnosis present

## 2017-10-15 DIAGNOSIS — D638 Anemia in other chronic diseases classified elsewhere: Secondary | ICD-10-CM | POA: Diagnosis present

## 2017-10-15 DIAGNOSIS — D75839 Thrombocytosis, unspecified: Secondary | ICD-10-CM

## 2017-10-15 DIAGNOSIS — R509 Fever, unspecified: Secondary | ICD-10-CM

## 2017-10-15 DIAGNOSIS — E876 Hypokalemia: Secondary | ICD-10-CM | POA: Diagnosis present

## 2017-10-15 DIAGNOSIS — R451 Restlessness and agitation: Secondary | ICD-10-CM | POA: Diagnosis not present

## 2017-10-15 DIAGNOSIS — D72829 Elevated white blood cell count, unspecified: Secondary | ICD-10-CM | POA: Diagnosis not present

## 2017-10-15 DIAGNOSIS — F17210 Nicotine dependence, cigarettes, uncomplicated: Secondary | ICD-10-CM

## 2017-10-15 DIAGNOSIS — D5702 Hb-SS disease with splenic sequestration: Secondary | ICD-10-CM

## 2017-10-15 DIAGNOSIS — F4325 Adjustment disorder with mixed disturbance of emotions and conduct: Secondary | ICD-10-CM | POA: Diagnosis present

## 2017-10-15 DIAGNOSIS — Z885 Allergy status to narcotic agent status: Secondary | ICD-10-CM | POA: Diagnosis not present

## 2017-10-15 DIAGNOSIS — D473 Essential (hemorrhagic) thrombocythemia: Secondary | ICD-10-CM | POA: Diagnosis present

## 2017-10-15 DIAGNOSIS — R64 Cachexia: Secondary | ICD-10-CM | POA: Diagnosis present

## 2017-10-15 DIAGNOSIS — E43 Unspecified severe protein-calorie malnutrition: Secondary | ICD-10-CM | POA: Diagnosis present

## 2017-10-15 DIAGNOSIS — E559 Vitamin D deficiency, unspecified: Secondary | ICD-10-CM | POA: Diagnosis present

## 2017-10-15 DIAGNOSIS — Z9049 Acquired absence of other specified parts of digestive tract: Secondary | ICD-10-CM

## 2017-10-15 DIAGNOSIS — F4324 Adjustment disorder with disturbance of conduct: Secondary | ICD-10-CM | POA: Diagnosis present

## 2017-10-15 DIAGNOSIS — Z888 Allergy status to other drugs, medicaments and biological substances status: Secondary | ICD-10-CM | POA: Diagnosis not present

## 2017-10-15 DIAGNOSIS — G8929 Other chronic pain: Secondary | ICD-10-CM | POA: Diagnosis not present

## 2017-10-15 DIAGNOSIS — R5381 Other malaise: Secondary | ICD-10-CM

## 2017-10-15 DIAGNOSIS — Z79891 Long term (current) use of opiate analgesic: Secondary | ICD-10-CM

## 2017-10-15 DIAGNOSIS — R5081 Fever presenting with conditions classified elsewhere: Secondary | ICD-10-CM | POA: Diagnosis not present

## 2017-10-15 DIAGNOSIS — M879 Osteonecrosis, unspecified: Secondary | ICD-10-CM | POA: Diagnosis present

## 2017-10-15 DIAGNOSIS — Z7982 Long term (current) use of aspirin: Secondary | ICD-10-CM

## 2017-10-15 DIAGNOSIS — F329 Major depressive disorder, single episode, unspecified: Secondary | ICD-10-CM | POA: Diagnosis present

## 2017-10-15 DIAGNOSIS — Z720 Tobacco use: Secondary | ICD-10-CM

## 2017-10-15 DIAGNOSIS — Z832 Family history of diseases of the blood and blood-forming organs and certain disorders involving the immune mechanism: Secondary | ICD-10-CM | POA: Diagnosis not present

## 2017-10-15 DIAGNOSIS — B001 Herpesviral vesicular dermatitis: Secondary | ICD-10-CM | POA: Diagnosis present

## 2017-10-15 DIAGNOSIS — R4689 Other symptoms and signs involving appearance and behavior: Secondary | ICD-10-CM

## 2017-10-15 DIAGNOSIS — D571 Sickle-cell disease without crisis: Secondary | ICD-10-CM | POA: Diagnosis present

## 2017-10-15 LAB — CBC WITH DIFFERENTIAL/PLATELET
Basophils Absolute: 0 10*3/uL (ref 0.0–0.1)
Basophils Relative: 0 %
Eosinophils Absolute: 0.2 10*3/uL (ref 0.0–0.7)
Eosinophils Relative: 1 %
HCT: 23.5 % — ABNORMAL LOW (ref 39.0–52.0)
Hemoglobin: 8.3 g/dL — ABNORMAL LOW (ref 13.0–17.0)
Lymphocytes Relative: 11 %
Lymphs Abs: 1.7 10*3/uL (ref 0.7–4.0)
MCH: 32.3 pg (ref 26.0–34.0)
MCHC: 35.3 g/dL (ref 30.0–36.0)
MCV: 91.4 fL (ref 78.0–100.0)
Monocytes Absolute: 1.2 10*3/uL — ABNORMAL HIGH (ref 0.1–1.0)
Monocytes Relative: 8 %
Neutro Abs: 12.2 10*3/uL — ABNORMAL HIGH (ref 1.7–7.7)
Neutrophils Relative %: 80 %
Platelets: 399 10*3/uL (ref 150–400)
RBC: 2.57 MIL/uL — ABNORMAL LOW (ref 4.22–5.81)
RDW: 20.2 % — ABNORMAL HIGH (ref 11.5–15.5)
WBC: 15.3 10*3/uL — ABNORMAL HIGH (ref 4.0–10.5)

## 2017-10-15 LAB — COMPREHENSIVE METABOLIC PANEL
ALT: 11 U/L (ref 0–44)
AST: 30 U/L (ref 15–41)
Albumin: 4.1 g/dL (ref 3.5–5.0)
Alkaline Phosphatase: 70 U/L (ref 38–126)
Anion gap: 8 (ref 5–15)
BUN: 5 mg/dL — ABNORMAL LOW (ref 6–20)
CO2: 26 mmol/L (ref 22–32)
Calcium: 8.9 mg/dL (ref 8.9–10.3)
Chloride: 106 mmol/L (ref 98–111)
Creatinine, Ser: 0.6 mg/dL — ABNORMAL LOW (ref 0.61–1.24)
GFR calc Af Amer: >60 mL/min (ref >60)
GFR calc non Af Amer: >60 mL/min (ref >60)
Glucose, Bld: 111 mg/dL — ABNORMAL HIGH (ref 70–99)
Potassium: 3.1 mmol/L — ABNORMAL LOW (ref 3.5–5.1)
Sodium: 140 mmol/L (ref 135–145)
Total Bilirubin: 3 mg/dL — ABNORMAL HIGH (ref 0.3–1.2)
Total Protein: 7.2 g/dL (ref 6.5–8.1)

## 2017-10-15 LAB — URINALYSIS, ROUTINE W REFLEX MICROSCOPIC
Bilirubin Urine: NEGATIVE
Glucose, UA: NEGATIVE mg/dL
Ketones, ur: NEGATIVE mg/dL
Leukocytes, UA: NEGATIVE
Nitrite: NEGATIVE
Protein, ur: NEGATIVE mg/dL
Specific Gravity, Urine: 1.004 — ABNORMAL LOW (ref 1.005–1.030)
pH: 7 (ref 5.0–8.0)

## 2017-10-15 LAB — RETICULOCYTES
RBC.: 2.57 MIL/uL — ABNORMAL LOW (ref 4.22–5.81)
Retic Count, Absolute: 395.8 10*3/uL — ABNORMAL HIGH (ref 19.0–186.0)
Retic Ct Pct: 15.4 % — ABNORMAL HIGH (ref 0.4–3.1)

## 2017-10-15 LAB — SAVE SMEAR

## 2017-10-15 MED ORDER — KETOROLAC TROMETHAMINE 30 MG/ML IJ SOLN
30.0000 mg | Freq: Three times a day (TID) | INTRAMUSCULAR | Status: AC | PRN
  Administered 2017-10-16: 30 mg via INTRAVENOUS
  Filled 2017-10-15 (×2): qty 1

## 2017-10-15 MED ORDER — HYDROMORPHONE HCL 1 MG/ML IJ SOLN: 2.0000 mg | INTRAMUSCULAR | Status: DC

## 2017-10-15 MED ORDER — POTASSIUM CHLORIDE CRYS ER 20 MEQ PO TBCR
20.0000 meq | EXTENDED_RELEASE_TABLET | Freq: Every day | ORAL | Status: DC
  Administered 2017-10-15 – 2017-10-17 (×3): 20 meq via ORAL
  Filled 2017-10-15 (×3): qty 1

## 2017-10-15 MED ORDER — NALOXONE HCL 0.4 MG/ML IJ SOLN: 0.4000 mg | INTRAMUSCULAR | Status: DC | PRN

## 2017-10-15 MED ORDER — VITAMIN D (ERGOCALCIFEROL) 1.25 MG (50000 UNIT) PO CAPS
50000.0000 [IU] | ORAL_CAPSULE | ORAL | Status: DC
  Administered 2017-10-18 – 2017-10-25 (×2): 50000 [IU] via ORAL
  Filled 2017-10-15 (×2): qty 1

## 2017-10-15 MED ORDER — HYDROMORPHONE 1 MG/ML IV SOLN
INTRAVENOUS | Status: DC
  Filled 2017-10-15: qty 25

## 2017-10-15 MED ORDER — ONDANSETRON HCL 4 MG/2ML IJ SOLN: 4.0000 mg | INTRAMUSCULAR | Status: DC | PRN

## 2017-10-15 MED ORDER — HYDROMORPHONE HCL 1 MG/ML IJ SOLN
2.0000 mg | INTRAMUSCULAR | Status: AC
  Administered 2017-10-15: 2 mg via SUBCUTANEOUS
  Filled 2017-10-15: qty 2

## 2017-10-15 MED ORDER — HYDROMORPHONE HCL 1 MG/ML IJ SOLN
2.0000 mg | INTRAMUSCULAR | Status: AC
  Administered 2017-10-15: 2 mg via INTRAVENOUS
  Filled 2017-10-15: qty 2

## 2017-10-15 MED ORDER — VITAMIN D3 25 MCG (1000 UNIT) PO TABS: 2000.0000 [IU] | ORAL_TABLET | Freq: Every day | ORAL | Status: DC

## 2017-10-15 MED ORDER — SODIUM CHLORIDE 0.9% FLUSH: 9.0000 mL | INTRAVENOUS | Status: DC | PRN

## 2017-10-15 MED ORDER — HYDROMORPHONE HCL 1 MG/ML IJ SOLN: 2.0000 mg | INTRAMUSCULAR | Status: AC

## 2017-10-15 MED ORDER — SODIUM CHLORIDE 0.9 % IV SOLN
25.0000 mg | INTRAVENOUS | Status: DC | PRN
  Filled 2017-10-15: qty 0.5

## 2017-10-15 MED ORDER — HYDROXYUREA 500 MG PO CAPS
500.0000 mg | ORAL_CAPSULE | Freq: Two times a day (BID) | ORAL | Status: DC
  Administered 2017-10-15 – 2017-10-25 (×20): 500 mg via ORAL
  Filled 2017-10-15 (×23): qty 1

## 2017-10-15 MED ORDER — HYDROMORPHONE HCL 1 MG/ML IJ SOLN
1.0000 mg | Freq: Once | INTRAMUSCULAR | Status: AC
  Administered 2017-10-15: 1 mg via INTRAVENOUS
  Filled 2017-10-15: qty 1

## 2017-10-15 MED ORDER — ONDANSETRON HCL 4 MG/2ML IJ SOLN: 4.0000 mg | Freq: Four times a day (QID) | INTRAMUSCULAR | Status: DC | PRN

## 2017-10-15 MED ORDER — HYDROMORPHONE 1 MG/ML IV SOLN: INTRAVENOUS | Status: DC

## 2017-10-15 MED ORDER — DIPHENHYDRAMINE HCL 50 MG/ML IJ SOLN: 25.0000 mg | Freq: Once | INTRAMUSCULAR | Status: DC

## 2017-10-15 MED ORDER — SENNOSIDES-DOCUSATE SODIUM 8.6-50 MG PO TABS
1.0000 | ORAL_TABLET | Freq: Two times a day (BID) | ORAL | Status: DC
  Administered 2017-10-15 – 2017-10-25 (×15): 1 via ORAL
  Filled 2017-10-15 (×19): qty 1

## 2017-10-15 MED ORDER — HYDROMORPHONE HCL 1 MG/ML IJ SOLN
2.0000 mg | INTRAMUSCULAR | Status: AC
  Administered 2017-10-15 (×2): 2 mg via INTRAVENOUS
  Filled 2017-10-15 (×2): qty 2

## 2017-10-15 MED ORDER — HYDROMORPHONE HCL 1 MG/ML IJ SOLN
2.0000 mg | INTRAMUSCULAR | Status: DC | PRN
  Administered 2017-10-15 (×4): 2 mg via INTRAVENOUS
  Filled 2017-10-15 (×4): qty 2

## 2017-10-15 MED ORDER — HYDROMORPHONE HCL 1 MG/ML IJ SOLN
2.0000 mg | Freq: Once | INTRAMUSCULAR | Status: AC
  Administered 2017-10-15: 2 mg via INTRAVENOUS
  Filled 2017-10-15: qty 2

## 2017-10-15 MED ORDER — POLYETHYLENE GLYCOL 3350 17 G PO PACK: 17.0000 g | PACK | Freq: Every day | ORAL | Status: DC | PRN

## 2017-10-15 MED ORDER — OXYCODONE-ACETAMINOPHEN 7.5-325 MG PO TABS
1.0000 | ORAL_TABLET | Freq: Four times a day (QID) | ORAL | Status: DC | PRN
  Administered 2017-10-15: 1 via ORAL
  Filled 2017-10-15: qty 1

## 2017-10-15 MED ORDER — DIPHENHYDRAMINE HCL 25 MG PO CAPS: 25.0000 mg | ORAL_CAPSULE | ORAL | Status: DC | PRN

## 2017-10-15 MED ORDER — ENOXAPARIN SODIUM 40 MG/0.4ML ~~LOC~~ SOLN: 40.0000 mg | SUBCUTANEOUS | Status: DC

## 2017-10-15 MED ORDER — FOLIC ACID 1 MG PO TABS
1.0000 mg | ORAL_TABLET | Freq: Every day | ORAL | Status: DC
  Administered 2017-10-15 – 2017-10-25 (×11): 1 mg via ORAL
  Filled 2017-10-15 (×12): qty 1

## 2017-10-15 MED ORDER — OXYCODONE HCL 5 MG PO TABS
10.0000 mg | ORAL_TABLET | Freq: Four times a day (QID) | ORAL | Status: DC | PRN
  Administered 2017-10-16: 10 mg via ORAL
  Filled 2017-10-15: qty 2

## 2017-10-15 MED ORDER — POTASSIUM CHLORIDE CRYS ER 20 MEQ PO TBCR
40.0000 meq | EXTENDED_RELEASE_TABLET | Freq: Once | ORAL | Status: AC
  Administered 2017-10-15: 40 meq via ORAL
  Filled 2017-10-15: qty 2

## 2017-10-15 MED ORDER — DEXTROSE-NACL 5-0.45 % IV SOLN
INTRAVENOUS | Status: DC
  Administered 2017-10-15 – 2017-10-18 (×10): via INTRAVENOUS

## 2017-10-15 MED ORDER — DIPHENHYDRAMINE HCL 25 MG PO CAPS
25.0000 mg | ORAL_CAPSULE | Freq: Once | ORAL | Status: DC
  Filled 2017-10-15: qty 1

## 2017-10-15 MED ORDER — ACETAMINOPHEN 325 MG PO TABS
650.0000 mg | ORAL_TABLET | Freq: Four times a day (QID) | ORAL | Status: DC | PRN
  Administered 2017-10-17 – 2017-10-19 (×5): 650 mg via ORAL
  Filled 2017-10-15 (×6): qty 2

## 2017-10-15 MED ORDER — HYDROMORPHONE HCL 1 MG/ML IJ SOLN
2.0000 mg | INTRAMUSCULAR | Status: DC | PRN
  Administered 2017-10-15 – 2017-10-16 (×6): 2 mg via INTRAVENOUS
  Filled 2017-10-15 (×6): qty 2

## 2017-10-15 MED ORDER — DIPHENHYDRAMINE HCL 50 MG/ML IJ SOLN
25.0000 mg | Freq: Once | INTRAMUSCULAR | Status: AC
  Administered 2017-10-15: 25 mg via INTRAVENOUS
  Filled 2017-10-15: qty 1

## 2017-10-15 MED ORDER — ENOXAPARIN SODIUM 40 MG/0.4ML ~~LOC~~ SOLN
40.0000 mg | SUBCUTANEOUS | Status: DC
  Administered 2017-10-16: 40 mg via SUBCUTANEOUS
  Filled 2017-10-15 (×2): qty 0.4

## 2017-10-15 MED ORDER — HYDROMORPHONE HCL 1 MG/ML IJ SOLN: 2.0000 mg | INTRAMUSCULAR | Status: DC | PRN

## 2017-10-15 MED ORDER — SENNOSIDES-DOCUSATE SODIUM 8.6-50 MG PO TABS: 1.0000 | ORAL_TABLET | Freq: Every evening | ORAL | Status: DC | PRN

## 2017-10-15 NOTE — Progress Notes (Signed)
This RN and Geanie Kenning RN wasted PCA Dilaudid syringe with 68mL remaining at this time.   Eleanora Neighbor, RN

## 2017-10-15 NOTE — ED Triage Notes (Signed)
Pt here c/o sickle cell pain that started at 3am. Reports 10/10 pain "all over." Denies chest pain, nausea, vomiting.  VSS. Bed locked in lowest position.

## 2017-10-15 NOTE — ED Notes (Addendum)
ED Provider at bedside. 

## 2017-10-15 NOTE — ED Notes (Signed)
Re-approached pt concerning need for UA, pt states "I am going to try in a minute." Urinal still at bedside.

## 2017-10-15 NOTE — Significant Event (Addendum)
Rapid Response Event Note  Overview: Time Called: 2127 Arrival Time: 2157 Event Type: Other (Comment)  Initial Focused Assessment: Called by RN about patient's sickle cell crisis pain that was not being controlled per the patient, in the back round, I could hear the patient yelling explicits and shouting. I asked that the RN page the primary team and I would come and see the patient as well.  I arrived as the MDs did, patient was laying in bed, seems "altered" at times but clear in other moments, stated he was hurting all over, but he would not comply with PCA safety measures so the PCA would lock off, patient also not complying with VS checks but its getting Dilaudid through the PCA. Moves all extremities spontaneously, EtCO2 37 and 100% on RA. Not in any acute distress. Primary service aware of mental status as well.   Interventions: - NO RRT INTERVENTIONS- - Patient was inappropriate with his language and using explicit language, I did explain to him that behavior like that will not be tolerated. Healthcare workers will not verbally assaulted and it will not be tolerated. He stated he understood, he stated he did not mean saying that, and he apologized.  - Suggested a safety sitter  Plan of Care (if not transferred): - none at this time -  - Not RRT call -   Event Summary: Name of Physician Notified: IMTS MDs paged prior to my arrival   Event End Time: [place], [place]

## 2017-10-15 NOTE — Progress Notes (Signed)
Paged by RN multiple times tonight. Patient was switched to PCA pump earlier today due to suboptimal pain control on Dilaudid 2 mg q2h PRN + Percocet 7.5 mg q6h PRN. However, he has been agitated and is removing nasal cannula and pulse ox which locks down PCA pump. As a result he has not been receiving pain medicine and has been continuously calling the nurses 4-5x/hour requesting pain medicine. Went to see patient at bedside. He was resting comfortably in bed. His only complaint was severe pain in both upper extremities. Educated patient on importance of maintaining [place] and pulse ox in order for PCA pump to work. He then became verbally aggressive and started swearing to physicians and nursing staff stating "you are not listening to me, I'm in pain" and "you're messing up my movie." Will discontinue PCA as he has barely received any pain medicine due to pump lock down. Will resume previous regimen with following adjustments. Will continue to monitor overnight.  - Ordered bedside sitter. Unfortunately no sitters available at this time. Order placed in case one is available throughout the night.  - Discontinue PCA  - IV Dilaudid 2 mg q2h PRN  - Oxy IR 10 mg q6h PRN  - Toradol 30 mg q8h PRN x 5 doses  - Tylenol 650 mg q6h PRN

## 2017-10-15 NOTE — ED Provider Notes (Signed)
[facility] Provider Note   CSN: [id number] Arrival date & time: 10/15/17  4081     History   Chief Complaint Chief Complaint  Patient presents with  . Sickle Cell Pain Crisis    HPI Timothy Marks is a 27 y.o. male.  Patient with a known history of sickle cell disease.  Patient last seen in the [facility] system in June.  But chart review shows that he was recently admitted to [facility] for sickle cell crisis.  At the end of August.  Patient states that about 3 in the morning started to get pain everywhere.  He states that he was hospitalized at [facility] from August 27 August 28.  Denies any fever.  Denies any significant chest pain or shortness of breath.  Patient does have Percocet at home for pain he did take a dose prior to arrival.  Patient has an extensive allergy list.  But he states he is able to take hydromorphone.  Occasionally gets some itching but Benadryl helps fine.  Patient will be started on her sickle cell protocol.     Past Medical History:  Diagnosis Date  . Depression   . 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 Sickle cell anemia (HCC)   . Vitamin D deficiency y-6    Patient Active Problem List   Diagnosis Date Noted  . Pain   . Sickle cell crisis ([place]) 07/16/2017  . Adjustment disorder with disturbance of conduct   . Constipation 08/03/2014  . 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 Protein-calorie malnutrition, severe ([place]) 04/09/2014  . Major depressive disorder, recurrent, severe without psychotic features ([place])   . 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 Vitamin D deficiency   . Sickle cell anemia ([place]) 03/30/2014  . Hyperbilirubinemia 03/30/2014  . Hypokalemia 02/07/2014  . Aggressive behavior 01/25/2013  . Sickle cell pain crisis ([place]) 01/24/2013    Past Surgical History:  Procedure Laterality Date  . CHOLECYSTECTOMY           Home Medications    Prior to Admission medications   Medication Sig Start Date End Date Taking? Authorizing Provider  folic acid (FOLVITE) 1 MG tablet Take 1 tablet (1 mg total) by mouth daily. 05/01/17  Yes Dorena Dew, FNP  hydroxyurea (HYDREA) 500 MG capsule Take 1 capsule (500 mg total) by mouth 2 (two) times daily. May take with food to minimize GI side effects. 05/01/17  Yes Dorena Dew, FNP  ibuprofen (ADVIL,MOTRIN) 800 MG tablet Take 1 tablet (800 mg total) by mouth 3 (three) times daily. Patient taking differently: Take 800 mg by mouth 3 (three) times daily as needed (pain).  05/01/17  Yes Dorena Dew, FNP  oxyCODONE-acetaminophen (PERCOCET) 10-325 MG tablet Take 1 tablet by mouth every 6 (six) hours as needed for up to 15 days for pain. 10/03/17 10/18/17 Yes Jegede, Marlena Clipper, MD  potassium chloride SA (K-DUR,KLOR-CON) 20 MEQ tablet Take 2 tablets (40 mEq total) by mouth daily. Patient taking differently: Take 20 mEq by mouth daily.  04/06/17  Yes Leana Gamer, MD  Vitamin D, Ergocalciferol, (DRISDOL) 50000 units CAPS capsule Take 1 capsule (50,000 Units total) by mouth every Wednesday. 09/13/17  Yes Lanae Boast, FNP  aspirin EC 81 MG tablet Take 1 tablet (81 mg total) by mouth daily. Patient not taking: Reported on 07/16/2017 06/02/17   Dorena Dew, FNP  cholecalciferol (VITAMIN D) 1000 UNITS tablet Take 2 tablets (2,000 Units total) by mouth daily. Patient not taking: Reported on 10/15/2017 04/09/14   Leana Gamer, MD    Family History Family History  Problem Relation Age of Onset  . Sickle cell trait Mother   . Sickle cell trait Father     Social History Social History   Tobacco Use  . Smoking status: Never Smoker  . Smokeless tobacco: Never Used  Substance Use Topics  . Alcohol use: No  . Drug use: No    Types: Marijuana    Comment: denies use 05/01/2017     Allergies   Buprenorphine hcl; Levaquin [levofloxacin];  Meperidine; Morphine and related; Toradol [ketorolac tromethamine]; Tramadol; Vancomycin; and Zosyn [piperacillin sod-tazobactam so]   Review of Systems Review of Systems  Constitutional: Negative for fever.  HENT: Negative for congestion.   Eyes: Negative for redness.  Respiratory: Negative for shortness of breath.   Cardiovascular: Negative for chest pain.  Gastrointestinal: Negative for abdominal pain.  Genitourinary: Negative for hematuria.  Musculoskeletal: Positive for arthralgias, back pain, myalgias and neck pain.  Skin: Negative for rash.  Allergic/Immunologic: Positive for immunocompromised state.  Neurological: Negative for syncope and headaches.  Hematological: Does not bruise/bleed easily.  Psychiatric/Behavioral: Negative for confusion.     Physical Exam Updated Vital Signs BP 127/83 (BP Location: Left Arm)   Pulse 100   Temp 99.2 F (37.3 C) (Oral)   Resp 13   Ht 1.88 m (6\' 2" )   Wt 56.7 kg   SpO2 92%   BMI 16.05 kg/m   Physical Exam  Constitutional: He is oriented to person, place, and time. He appears well-developed and well-nourished. No distress.  HENT:  Head: Normocephalic and atraumatic.  Mouth/Throat: Oropharynx is clear and moist.  Eyes: Pupils are equal, round, and reactive to light. Conjunctivae and EOM are normal.  Neck: Normal range of motion. Neck supple.  Cardiovascular: Normal rate, regular rhythm and normal heart sounds.  Pulmonary/Chest: Effort normal and breath sounds normal. No respiratory distress.  Abdominal: Soft. Bowel sounds are normal. There is no tenderness.  Musculoskeletal: Normal range of motion. He exhibits no edema.  Neurological: He is alert and oriented to person, place, and time. No cranial nerve deficit or sensory deficit. He exhibits normal muscle tone. Coordination normal.  Skin: Skin is warm.  Nursing note and vitals reviewed.    ED Treatments / Results  Labs (all labs ordered are listed, but only abnormal  results are displayed) Labs Reviewed  RETICULOCYTES - Abnormal; Notable for the following components:      Result Value   Retic Ct Pct 15.4 (*)    RBC. 2.57 (*)    Retic Count, Absolute 395.8 (*)    All other components within normal limits  COMPREHENSIVE METABOLIC PANEL - Abnormal; Notable for the following components:   Potassium 3.1 (*)    Glucose, Bld 111 (*)    BUN 5 (*)    Creatinine, Ser 0.60 (*)    Total Bilirubin 3.0 (*)    All other components within normal limits  CBC WITH DIFFERENTIAL/PLATELET - Abnormal; Notable for the following components:   WBC 15.3 (*)    RBC 2.57 (*)    Hemoglobin 8.3 (*)    HCT 23.5 (*)    RDW 20.2 (*)    Neutro Abs 12.2 (*)    Monocytes Absolute 1.2 (*)    All other components within normal limits  URINALYSIS, ROUTINE W REFLEX MICROSCOPIC - Abnormal;  Notable for the following components:   Specific Gravity, Urine 1.004 (*)    Hgb urine dipstick MODERATE (*)    Bacteria, UA RARE (*)    All other components within normal limits  SAVE SMEAR    EKG None  Radiology No results found.  Procedures Procedures (including critical care time) CRITICAL CARE Performed by: Fredia Sorrow Total critical care time: 30 minutes Critical care time was exclusive of separately billable procedures and treating other patients. Critical care was necessary to treat or prevent imminent or life-threatening deterioration. Critical care was time spent personally by me on the following activities: development of treatment plan with patient and/or surrogate as well as nursing, discussions with consultants, evaluation of patient's response to treatment, examination of patient, obtaining history from patient or surrogate, ordering and performing treatments and interventions, ordering and review of laboratory studies, ordering and review of radiographic studies, pulse oximetry and re-evaluation of patient's condition.   Medications Ordered in ED Medications   dextrose 5 %-0.45 % sodium chloride infusion ( Intravenous New Bag/Given 10/15/17 0857)  ondansetron (ZOFRAN) injection 4 mg (has no administration in time range)  diphenhydrAMINE (BENADRYL) capsule 25 mg (25 mg Oral Not Given 10/15/17 0817)  potassium chloride SA (K-DUR,KLOR-CON) CR tablet 20 mEq (20 mEq Oral Given 10/15/17 1539)  Vitamin D (Ergocalciferol) (DRISDOL) capsule 50,000 Units (has no administration in time range)  folic acid (FOLVITE) tablet 1 mg (1 mg Oral Given 10/15/17 1539)  hydroxyurea (HYDREA) capsule 500 mg (500 mg Oral Given 10/15/17 1539)  senna-docusate (Senokot-S) tablet 1 tablet (1 tablet Oral Given 10/15/17 1538)  polyethylene glycol (MIRALAX / GLYCOLAX) packet 17 g (has no administration in time range)  enoxaparin (LOVENOX) injection 40 mg (has no administration in time range)  oxyCODONE-acetaminophen (PERCOCET) 7.5-325 MG per tablet 1 tablet (has no administration in time range)  HYDROmorphone (DILAUDID) injection 2 mg (2 mg Intravenous Given 10/15/17 1659)  HYDROmorphone (DILAUDID) injection 2 mg ( Intravenous See Alternative 10/15/17 0817)    Or  HYDROmorphone (DILAUDID) injection 2 mg (2 mg Subcutaneous Given 10/15/17 0817)  HYDROmorphone (DILAUDID) injection 2 mg (2 mg Intravenous Given 10/15/17 0857)    Or  HYDROmorphone (DILAUDID) injection 2 mg ( Subcutaneous See Alternative 10/15/17 0857)  HYDROmorphone (DILAUDID) injection 2 mg (2 mg Intravenous Given 10/15/17 0933)    Or  HYDROmorphone (DILAUDID) injection 2 mg ( Subcutaneous See Alternative 10/15/17 0933)  HYDROmorphone (DILAUDID) injection 2 mg (2 mg Intravenous Given 10/15/17 1132)    Or  HYDROmorphone (DILAUDID) injection 2 mg ( Subcutaneous See Alternative 10/15/17 1132)  diphenhydrAMINE (BENADRYL) injection 25 mg (25 mg Intravenous Given 10/15/17 0900)  HYDROmorphone (DILAUDID) injection 1 mg (1 mg Intravenous Given 10/15/17 1320)  HYDROmorphone (DILAUDID) injection 2 mg (2 mg Intravenous Given 10/15/17 1441)  potassium chloride  SA (K-DUR,KLOR-CON) CR tablet 40 mEq (40 mEq Oral Given 10/15/17 1539)     Initial Impression / Assessment and Plan / ED Course  I have reviewed the triage vital signs and the nursing notes.  Pertinent labs & imaging results that were available during my care of the patient were reviewed by me and considered in my medical decision making (see chart for details).     Patient with a known history of sickle cell disease.  And with acute count was fairly high hemoglobin relatively stable.  Patient was treated with sickle cell pain protocol.  Pain did not resolve patient was still fairly uncomfortable.  Therefore required admission.  Patient without any complicating factors.  Patient be admitted by the internal medicine service.  Patient was retreated with pain medicine assessed frequently.  Make sure he remains stable received IV fluids.  Pain improved some but was never resolved patient was not comfortable.  Patient without any significant chest pain.   Final Clinical Impressions(s) / ED Diagnoses   Final diagnoses:  Sickle cell pain crisis [facility])    ED Discharge Orders    None       Fredia Sorrow, MD 10/15/17 [phone]

## 2017-10-15 NOTE — ED Notes (Signed)
This RN attempted to gain IV access and draw labs x1 unsuccessfully. IV team consult placed. MD aware.

## 2017-10-15 NOTE — Progress Notes (Signed)
Patient came from ED via stretcher. Patient is alert and oriented x4 and complaining of severe pain all over. 20g IV: right upper arm, and is clean, dry and intact. Telemetry box: 18: NSR. Patient is in bed and pain medication was given. Call bell is within reach. Will continue to monitor. Bed alarm is on.   Farley Ly RN

## 2017-10-15 NOTE — Progress Notes (Signed)
Pt has called out 4 times in the last 5 minutes. Pt c/o pain and needing "help". This RN messaged IMTS.   Eleanora Neighbor, RN

## 2017-10-15 NOTE — ED Notes (Addendum)
Pt aware of need for urine sample for UA, urinal at bedside

## 2017-10-15 NOTE — Progress Notes (Addendum)
This RN and Janora Norlander RN have been trying to set up PCA pump for pt. Pt received one dose and took the equipment off. Pt stated he did not want this "bullshit" on him. This RN tried to explain to the pt the importance of monitoring his vitals while taking this medication and that the pump will not work if the equipment is not on. Pt still refused and took it off. PT refusing vitals. Pt is not making sense when he talks. Pt has been making noises and even "barking" like a dog. This RN called Rapid and the on call MD to assess. Rapid told this RN to have MD come to assess due to sickle cell and pain control. Will continue to monitor.   Eleanora Neighbor

## 2017-10-15 NOTE — H&P (Addendum)
Date: 10/15/2017               Patient Name:  Timothy Marks MRN: [id number]  DOB: 1990-09-09 Age / Sex: 27 y.o., male   PCP: Timothy Marks, [place]         Medical Service: Internal Medicine Teaching Service         Attending Physician: Dr. Oval Linsey, MD    First Contact: Dr. Gilberto Marks Pager: [phone]  Second Contact: Dr. Pearson Marks Pager: [phone]       After Hours (After 5p/  First Contact Pager: [phone]  weekends / holidays): Second Contact Pager: [phone]   Chief Complaint: Sickle Cell Crisis  History of Present Illness: Mr. Timothy Marks is a 27 yo w/ PMH of Sickle Cell disease presenting to the ED with sickle cell crisis. He is endorsing severe uncontrolled pain. He states the pain comes in 'spasms and waves' with focal tenderness in the joints and back and neck. He states anybody touching him or even slightly rocking the bed is unbearable. He states the pain began at 3am and has been uncontrolled with his home pain regimen. States he always comes to the ED during crisis and they always appear to occur around 'weather changes.' He states he has been taking his hydroxyurea every day as well as his folate and vitamin D supplements. He has regular follow up with Sickle Cell clinic with his last appointment on 8/7. He is endorsing slow urine stream secondary to pain. Denies any F/N/V/D/C/CP/Palpitations/Dyspnea.   In the ED, he received Dilaudid 2mg  q27mins but he is still endorsing significant pain. Started on IV dextrose 5%-0.45% infusion at 125cc/hr  Meds:  Current Meds  Medication Sig  . folic acid (FOLVITE) 1 MG tablet Take 1 tablet (1 mg total) by mouth daily.  . hydroxyurea (HYDREA) 500 MG capsule Take 1 capsule (500 mg total) by mouth 2 (two) times daily. May take with food to minimize GI side effects.  Marland Kitchen ibuprofen (ADVIL,MOTRIN) 800 MG tablet Take 1 tablet (800 mg total) by mouth 3 (three) times daily. (Patient taking differently: Take 800 mg by mouth 3 (three) times daily  as needed (pain). )  . oxyCODONE-acetaminophen (PERCOCET) 10-325 MG tablet Take 1 tablet by mouth every 6 (six) hours as needed for up to 15 days for pain.  . potassium chloride SA (K-DUR,KLOR-CON) 20 MEQ tablet Take 2 tablets (40 mEq total) by mouth daily. (Patient taking differently: Take 20 mEq by mouth daily. )  . Vitamin D, Ergocalciferol, (DRISDOL) 50000 units CAPS capsule Take 1 capsule (50,000 Units total) by mouth every Wednesday.   Allergies: Allergies as of 10/15/2017 - Review Complete 10/15/2017  Allergen Reaction Noted  . Buprenorphine hcl Hives 06/10/2015  . Levaquin [levofloxacin] Itching 05/07/2014  . Meperidine Rash 06/10/2015  . Morphine and related Hives 09/02/2013  . Toradol [ketorolac tromethamine] Itching 01/24/2013  . Tramadol Hives 01/24/2013  . Vancomycin Itching 04/24/2013  . Zosyn [piperacillin sod-tazobactam so] Itching and Rash 10/06/2013   Past Medical History:  Diagnosis Date  . Depression   . 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 Sickle cell anemia (HCC)   . Vitamin D deficiency y-6   Family History: Cousin with sickle cell disease. Known sickle cell traits in parents, sister and son.  Social History: Lives at home with mother and sister. Currently on disability not working. Smokes about 1/3 pack a day for 15 years. Denis EtOh, IVDU  Review of Systems: A complete ROS was negative except as per HPI.  Physical Exam: Blood pressure 127/83, pulse 100, temperature 99.2 F (37.3 C), temperature source Oral, resp. rate 13, height 6\' 2"  (1.88 m), weight 56.7 kg, SpO2 92 %. Physical Exam  Constitutional: He is oriented to person, place, and time. He appears distressed (in writhing pain).  HENT:  Head: Normocephalic and atraumatic.  Mouth/Throat: Oropharynx is clear and moist. No oropharyngeal exudate.  Eyes: Pupils are equal, round, and reactive to light. Conjunctivae and EOM are normal.    Neck: Normal range of motion. Neck supple.  Cardiovascular: Regular rhythm, normal heart sounds and intact distal pulses.  tachycardic  Pulmonary/Chest: Effort normal and breath sounds normal. No respiratory distress. He has no wheezes. He has no rales.  Abdominal: Soft. Bowel sounds are normal.  Pt not allowing palpation or percussion 2/2 pain  Musculoskeletal: Normal range of motion. He exhibits tenderness (extreme tenderness to light touch everywhere). He exhibits no edema.  Neurological: He is alert and oriented to person, place, and time. No cranial nerve deficit.  Skin: Skin is warm and dry. He is not diaphoretic.  Psychiatric: Mood, memory, affect and judgment normal.   EKG: None  CXR: None  Assessment & Plan by Problem: Active Problems:   Sickle cell crisis ([place])  Mr.Timothy Marks is a 27 yo M w/ PMH of Sickle Cell Disease presenting with severe pain all over due to sickle cell crisis. Goal is pain control and IV fluid resuscitation. 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  Sickle Cell Crisis Presenting with severe pain in crisis. Significant pain in neck, chest, abdomen, back. No evidence of infarct events or aseptic necrosis. Afebrile. No evidence of acute chest syndrome. Most likely will require higher doses of pain regimen considering long term use of opioids and frequent hospitalization for crisis events. Retic 15.4%, Hgb on UA, WBC 15.3, Hgb 8.3 - C/w dextrose 5%, 1/2 NS at 125cc/hr - Keep O2sat above 88 - Start Dilaudid 2mg  q1hr - C/w home meds: hydroxyurea 500mg  BID, Folate 1mg  daily, K-dur 46mEq daily, Vit D, Percocet 7.5-325 q6hr   Hypokalemia   Has history of chronic hypokalemia on K-dur 36mEq at home. Current K 3.1 - Add on 1 time dose of K-dur 71mEq to his home regimen  Sickle Cell Anemia Baseline hgb 9-10. Currently 8.3 Asymptomatic sickle cell anemia. Possibly lower than baseline 2/2 splenic sequestration. No  indication for transfusion yet - Continue trending CBC - Transfuse if <7  Diet: Regular DVT prophx: Lovenox Bowel: Senokot, Zofran PRN for nausea Code: Full  Dispo: Admit patient to Inpatient with expected length of stay greater than 2 midnights.  Signed: Kristie Cowman, PGY1 Pager: [phone]

## 2017-10-15 NOTE — Progress Notes (Signed)
IMTS and Rapid Response, [place] came to assess pt. Pt stated he would leave the equipment alone and allow staff to take vitals. IMTS stated they would put in orders for a safety sitter, however not one is available at this time. This RN and NT will continue to round on pt to make sure equipment is on properly.   Eleanora Neighbor, RN

## 2017-10-16 DIAGNOSIS — Z9119 Patient's noncompliance with other medical treatment and regimen: Secondary | ICD-10-CM

## 2017-10-16 MED ORDER — NALOXONE HCL 0.4 MG/ML IJ SOLN: 0.4000 mg | INTRAMUSCULAR | Status: DC | PRN

## 2017-10-16 MED ORDER — HYDROMORPHONE HCL 1 MG/ML IJ SOLN
1.0000 mg | Freq: Once | INTRAMUSCULAR | Status: AC
  Administered 2017-10-16: 1 mg via INTRAVENOUS
  Filled 2017-10-16: qty 1

## 2017-10-16 MED ORDER — HYDROMORPHONE HCL 1 MG/ML IJ SOLN
2.0000 mg | INTRAMUSCULAR | Status: DC | PRN
  Administered 2017-10-16 – 2017-10-17 (×13): 2 mg via INTRAVENOUS
  Filled 2017-10-16 (×13): qty 2

## 2017-10-16 MED ORDER — SODIUM CHLORIDE 0.9% FLUSH: 9.0000 mL | INTRAVENOUS | Status: DC | PRN

## 2017-10-16 MED ORDER — HYDROMORPHONE HCL 1 MG/ML IJ SOLN
2.0000 mg | INTRAMUSCULAR | Status: DC
  Administered 2017-10-16: 2 mg via INTRAVENOUS
  Filled 2017-10-16: qty 2

## 2017-10-16 NOTE — Progress Notes (Signed)
Subjective:  Timothy Marks is a 27 y.o. with PMH of Sickle Cell disease admit for sickle cell crisis on hospital day 1  Timothy Marks was examined and evaluated at bedside this a.m. he is seen writhing in pain in the bed with shaking lower extremities. He states his pain is not controlled.  He continues to shout out and wince in pain with slightest movements. Discussed with the patient about being moved to a step-down unit for closer monitoring with higher doses of opioid. Patient agrees. He states he has been eating but his sitter states that he has been only drinking orange juice.Denies any F/N/V/D/C. Denies chest pain, palpitations, blurry vision, headaches. Patient received phone call from his mother during our conversation. He refused to let me speak to his mother to update her on his care.     Objective:  Vital signs in last 24 hours: Vitals:   10/15/17 2218 10/16/17 0631 10/16/17 0949 10/16/17 1122  BP: 116/80 126/78  129/83  Pulse: (!) 111 (!) 107 93 (!) 118  Resp: 20 16  (!) 24  Temp: 100.2 F (37.9 C)     TempSrc: Oral     SpO2: 98% 97% 98%   Weight:      Height:       Physical Exam  Constitutional: He appears distressed.  HENT:  Head: Normocephalic and atraumatic.  Dry chapped lips  Eyes: Pupils are equal, round, and reactive to light. Conjunctivae and EOM are normal. No scleral icterus.  Neck: Neck supple.  Range of motion limited by pain  Cardiovascular: Regular rhythm, normal heart sounds and intact distal pulses.  Tachycardic  Pulmonary/Chest: Effort normal and breath sounds normal. No respiratory distress. He has no wheezes. He has no rales.  Abdominal: Soft. Bowel sounds are normal.  Refused palpation or percussion  Musculoskeletal: Normal range of motion. He exhibits tenderness (Tenderness to palpation or movement of both upper and lower extremities ).  Neurological: He is alert.  Skin: Skin is warm and dry. He is not diaphoretic.  Psychiatric:  Has difficulty  concentrating   Assessment/Plan:  Active Problems:   Sickle cell pain crisis (HCC)   Hypokalemia   Sickle cell anemia (HCC)   Sickle cell crisis (HCC)  Timothy Marks is a 27 yo M w/ PMH of Sickle Cell Disease presenting sickle cell crisis. Pain management has been difficult due to non-adherence with PCA and inappropriate behavior with nursing staff. Refusing labs. Currently still in significant distress with inadequate pain control. Will try to up-titrate.  Sickle Cell Crisis Currently on Dilaudid 2mg  q2hr. Attempted PCA last night but pt locked out due to non-adherence with O2 sat monitoring. Unable to get new labs as patient refused. Vitals stable without fever. No evidence of acute chest syndrome or other infarct events yet. - C/w dextrose 5%, 1/2 NS at 125cc/hr - Keep O2sat above 88 - Transfer to progressive care unit - Start Dilaudid 2mg  q1.5hrs PRN for pain - C/w home meds: hydroxyurea 500mg  BID, Folate 1mg  daily, K-dur 13mEq daily, Vit D, Percocet 7.5-325 q6hr   Hypokalemia   Has history of chronic hypokalemia on K-dur 76mEq at home. Unable to monitor due to patient refusing labs - Monitor BMP if possible - C/w home regimen: K-dur 31mEq daily  Sickle Cell Anemia Baseline hgb 9-10. Yesterday 8.3 Unable to monitor due to patient refusing labs - Continue trending CBC if possible - Transfuse if <7  Diet: Regular DVT prophx: Lovenox Bowel: Senokot, Zofran PRN for nausea Code: Full  Dispo: Anticipated discharge in approximately 3 day(s).   Mosetta Anis, MD 10/16/2017, 11:40 AM Pager: [phone]

## 2017-10-16 NOTE — Plan of Care (Signed)
  Problem: Activity: Goal: Ability to return to normal activity level will improve to the fullest extent possible by discharge Outcome: Progressing   Problem: Education: Goal: Knowledge of medication regimen will be met for pain relief regimen by discharge Outcome: Progressing Goal: Understanding of ways to prevent infection will improve by discharge Outcome: Progressing   Problem: Coping: Goal: Ability to verbalize feelings will improve by discharge Outcome: Progressing Goal: Family members realistic understanding of the patients condition will improve by discharge Outcome: Progressing   Problem: Fluid Volume: Goal: Maintenance of adequate hydration will improve by discharge Outcome: Progressing   Problem: Medication: Goal: Compliance with prescribed medication regimen will improve by discharge Outcome: Progressing   Problem: Physical Regulation: Goal: Hemodynamic stability will return to baseline for the patient by discharge Outcome: Progressing Goal: Diagnostic test results will improve Outcome: Progressing Goal: Will remain free from infection Outcome: Progressing   Problem: Respiratory: Goal: Ability to maintain adequate oxygenation and ventilation will improve by discharge Outcome: Progressing   Problem: Role Relationship: Goal: Ability to identify and utilize available support systems will improve by discharge Outcome: Progressing   Problem: Pain Management: Goal: Satisfaction with pain management regimen will be met by discharge Outcome: Progressing

## 2017-10-16 NOTE — Progress Notes (Signed)
Pt refusing tele.  Eleanora Neighbor, RN

## 2017-10-16 NOTE — Progress Notes (Signed)
Pt refused labs.  Timothy Marks

## 2017-10-16 NOTE — Progress Notes (Signed)
Pt rude and cursing at this RN. Pt did not want the Oxycodone. Pt stated "I want the Dilaudid, not this shit!" "Come on, hurry up and just give me the Dilaudid."  It is too soon for the Dilaudid. This RN informed pt not to speak to staff that way that we are here to help you and we need to have respect for one another. Pt stated that he was sorry. Pt did take the Oxycodone. Will continue to monitor.   Eleanora Neighbor, RN

## 2017-10-16 NOTE — Progress Notes (Signed)
Pt keeps calling out literally every second! Pt c/o pain and just took Oxy. Pt keeps asking for Dilaudid. This RN messaged MD on call.   Timothy Marks

## 2017-10-16 NOTE — Progress Notes (Signed)
Patient is transferred to 4N progressive, gave the report and answered all the questions.

## 2017-10-17 ENCOUNTER — Inpatient Hospital Stay (HOSPITAL_COMMUNITY): Payer: Medicaid Other

## 2017-10-17 DIAGNOSIS — R4182 Altered mental status, unspecified: Secondary | ICD-10-CM

## 2017-10-17 LAB — CBC
HCT: 20.5 % — ABNORMAL LOW (ref 39.0–52.0)
Hemoglobin: 7.1 g/dL — ABNORMAL LOW (ref 13.0–17.0)
MCH: 31.3 pg (ref 26.0–34.0)
MCHC: 34.6 g/dL (ref 30.0–36.0)
MCV: 90.3 fL (ref 78.0–100.0)
Platelets: 305 10*3/uL (ref 150–400)
RBC: 2.27 MIL/uL — ABNORMAL LOW (ref 4.22–5.81)
RDW: 18.5 % — ABNORMAL HIGH (ref 11.5–15.5)
WBC: 15.9 10*3/uL — ABNORMAL HIGH (ref 4.0–10.5)

## 2017-10-17 LAB — BASIC METABOLIC PANEL
Anion gap: 9 (ref 5–15)
BUN: 7 mg/dL (ref 6–20)
CO2: 26 mmol/L (ref 22–32)
Calcium: 8.4 mg/dL — ABNORMAL LOW (ref 8.9–10.3)
Chloride: 100 mmol/L (ref 98–111)
Creatinine, Ser: 0.64 mg/dL (ref 0.61–1.24)
GFR calc Af Amer: >60 mL/min (ref >60)
GFR calc non Af Amer: >60 mL/min (ref >60)
Glucose, Bld: 110 mg/dL — ABNORMAL HIGH (ref 70–99)
Potassium: 3.4 mmol/L — ABNORMAL LOW (ref 3.5–5.1)
Sodium: 135 mmol/L (ref 135–145)

## 2017-10-17 MED ORDER — GABAPENTIN 300 MG PO CAPS
300.0000 mg | ORAL_CAPSULE | Freq: Three times a day (TID) | ORAL | Status: DC
  Administered 2017-10-17 – 2017-10-25 (×26): 300 mg via ORAL
  Filled 2017-10-17 (×27): qty 1

## 2017-10-17 MED ORDER — HYDROMORPHONE HCL 1 MG/ML IJ SOLN
2.0000 mg | INTRAMUSCULAR | Status: DC | PRN
  Administered 2017-10-17 – 2017-10-18 (×9): 2 mg via INTRAVENOUS
  Filled 2017-10-17 (×9): qty 2

## 2017-10-17 MED ORDER — SODIUM CHLORIDE 0.9 % IV SOLN
2.0000 g | Freq: Once | INTRAVENOUS | Status: AC
  Administered 2017-10-17: 2 g via INTRAVENOUS
  Filled 2017-10-17: qty 20

## 2017-10-17 MED ORDER — RAMELTEON 8 MG PO TABS
8.0000 mg | ORAL_TABLET | Freq: Once | ORAL | Status: AC
  Administered 2017-10-17: 8 mg via ORAL
  Filled 2017-10-17: qty 1

## 2017-10-17 NOTE — Progress Notes (Signed)
   Subjective:  Timothy Marks is a 27 y.o. with PMH of Sickle cell disease admit for sickle cell crisis on hospital day 2  Timothy Marks was examined and evaluated at bedside this a.m. He is intermittently zoning out and answers questions slowly. Still continuing to endorse tenderness to touch. He denies any significant chest pain per patient dyspnea nausea vomiting diarrhea constipation.  Denies any fevers or chills.  His sitter states that he has not been eating any of his meals.  He was endorsing some altered mental status.  However this was around when he received his Dilaudid dose.  His nurse states that he has not had any behavior issues since moving to progressive unit.   Objective:  Vital signs in last 24 hours: Vitals:   10/17/17 0454 10/17/17 0500 10/17/17 0501 10/17/17 0748  BP:    134/77  Pulse: (!) 127   (!) 121  Resp:   17 19  Temp:  99.2 F (37.3 C)  98.9 F (37.2 C)  TempSrc:  Oral  Oral  SpO2: 100%   100%  Weight:      Height:       Physical Exam  Constitutional: He is oriented to person, place, and time.  Lethargic male intermittently lucid  HENT:  Head: Normocephalic and atraumatic.  Mouth/Throat: Oropharynx is clear and moist. No oropharyngeal exudate.  Dry lips  Eyes: Pupils are equal, round, and reactive to light. Conjunctivae are normal.  Neck: Normal range of motion. Neck supple.  Cardiovascular: Regular rhythm, normal heart sounds and intact distal pulses.  tachycardic  Pulmonary/Chest: Effort normal and breath sounds normal.  Abdominal: Soft. Bowel sounds are normal. There is tenderness (tenderness to light palpation).  Musculoskeletal: Normal range of motion. He exhibits tenderness. He exhibits no edema or deformity.  Neurological: He is alert and oriented to person, place, and time.  Skin: Skin is warm and dry.  Psychiatric: Mood and affect normal.   Assessment/Plan:  Active Problems:   Sickle cell pain crisis (HCC)   Hypokalemia   Sickle cell  anemia (HCC)   Sickle cell crisis (HCC)  Timothy Marks is a 27 yo M w/ PMH of Sickle Cell Disease presenting sickle cell crisis. Pain management sufficient at 2mg  q1.5hrs Dilaudid. Will attempt to wean. His hemoglobin declining to 7.1. Difficult to assess symptoms as patient is altered on dilaudid. Will transfuse if dropping below 6.  Sickle Cell Crisis Currently on Dilaudid 2mg  q1.5hr.  Vitals stable without fever. No evidence of acute chest syndrome or other infarct events yet. Hgb 7.1 <- 8.3 - C/w dextrose 5%, 1/2 NS at 125cc/hr - Keep O2sat above 88 - Change Dilaudid 2mg  q1.5hrs to q2hrs PRN for pain - Add on gabapentin 300mg  TID for neuropathic pain - C/w home meds: hydroxyurea 500mg  BID, Folate 1mg  daily, K-dur 9mEq daily, Vit D, Percocet 7.5-325 q6hr  Hypokalemia Has history of chronic hypokalemia on K-dur 71mEq at home. 3.4 this AM - Monitor BMP  - C/w home regimen: K-dur 75mEq daily  Sickle Cell Anemia Baseline hgb 9-10. Currently down-trending at 7.1 this AM from 8.3 on admission  -Continue trending CBC - Type and Screen - Transfuse if <6  Diet: Regular DVT prophx:Lovenox Bowel: Senokot, Zofran PRN for nausea Code:Full Dispo: Anticipated discharge in approximately 3 day(s).   Mosetta Anis, MD 10/17/2017, 11:35 AM Pager: [phone]

## 2017-10-17 NOTE — Plan of Care (Signed)
  Problem: Education: Goal: Knowledge of vaso-occlusive preventative measures will improve 10/17/2017 1518 by Lawanda Cousins, RN Outcome: Progressing 10/17/2017 1517 by Lawanda Cousins, RN Reactivated 10/17/2017 1516 by Lawanda Cousins, RN Outcome: Not Applicable Goal: Awareness of infection prevention will improve 10/17/2017 1518 by Lawanda Cousins, RN Outcome: Progressing 10/17/2017 1517 by Lawanda Cousins, RN Reactivated 10/17/2017 1516 by Lawanda Cousins, RN Outcome: Not Applicable Goal: Awareness of signs and symptoms of anemia will improve 10/17/2017 1518 by Lawanda Cousins, RN Outcome: Progressing 10/17/2017 1517 by Lawanda Cousins, RN Reactivated 10/17/2017 1516 by Lawanda Cousins, RN Outcome: Not Applicable Goal: Long-term complications will improve 10/17/2017 1518 by Lawanda Cousins, RN Outcome: Progressing 10/17/2017 1517 by Lawanda Cousins, RN Reactivated 10/17/2017 1516 by Lawanda Cousins, RN Outcome: Not Applicable   Problem: Clinical Measurements: Goal: Ability to maintain clinical measurements within normal limits will improve 10/17/2017 1518 by Lawanda Cousins, RN Outcome: Progressing 10/17/2017 1518 by Lawanda Cousins, RN Reactivated 10/17/2017 1516 by Lawanda Cousins, RN Outcome: Not Applicable Goal: Will remain free from infection 10/17/2017 1518 by Lawanda Cousins, RN Outcome: Progressing 10/17/2017 1518 by Lawanda Cousins, RN Reactivated 10/17/2017 1516 by Lawanda Cousins, RN Outcome: Not Applicable Goal: Diagnostic test results will improve 10/17/2017 1518 by Lawanda Cousins, RN Outcome: Progressing 10/17/2017 1518 by Lawanda Cousins, RN Reactivated 10/17/2017 1516 by Lawanda Cousins, RN Outcome: Not Applicable Goal: Respiratory complications will improve 10/17/2017 1518 by Lawanda Cousins, RN Outcome: Progressing 10/17/2017 1518 by Lawanda Cousins, RN Reactivated 10/17/2017 1516 by Lawanda Cousins, RN Outcome: Not Applicable Goal: Cardiovascular complication will be avoided 10/17/2017 1518 by Lawanda Cousins, RN Outcome: Progressing 10/17/2017 1518 by Lawanda Cousins, RN Reactivated 10/17/2017 1516 by Lawanda Cousins, RN Outcome: Not Applicable

## 2017-10-18 ENCOUNTER — Ambulatory Visit: Payer: Self-pay | Admitting: Family Medicine

## 2017-10-18 DIAGNOSIS — Z9889 Other specified postprocedural states: Secondary | ICD-10-CM

## 2017-10-18 DIAGNOSIS — R509 Fever, unspecified: Secondary | ICD-10-CM

## 2017-10-18 LAB — CBC
HCT: 18.1 % — ABNORMAL LOW (ref 39.0–52.0)
Hemoglobin: 6.2 g/dL — CL (ref 13.0–17.0)
MCH: 30.8 pg (ref 26.0–34.0)
MCHC: 34.3 g/dL (ref 30.0–36.0)
MCV: 90 fL (ref 78.0–100.0)
Platelets: 301 10*3/uL (ref 150–400)
RBC: 2.01 MIL/uL — ABNORMAL LOW (ref 4.22–5.81)
RDW: 17 % — ABNORMAL HIGH (ref 11.5–15.5)
WBC: 18.4 10*3/uL — ABNORMAL HIGH (ref 4.0–10.5)

## 2017-10-18 LAB — BASIC METABOLIC PANEL
Anion gap: 7 (ref 5–15)
Anion gap: 8 (ref 5–15)
BUN: <5 mg/dL — ABNORMAL LOW (ref 6–20)
BUN: 5 mg/dL — ABNORMAL LOW (ref 6–20)
CO2: 29 mmol/L (ref 22–32)
CO2: 27 mmol/L (ref 22–32)
Calcium: 8.2 mg/dL — ABNORMAL LOW (ref 8.9–10.3)
Calcium: 8.3 mg/dL — ABNORMAL LOW (ref 8.9–10.3)
Chloride: 101 mmol/L (ref 98–111)
Chloride: 102 mmol/L (ref 98–111)
Creatinine, Ser: 0.57 mg/dL — ABNORMAL LOW (ref 0.61–1.24)
Creatinine, Ser: 0.56 mg/dL — ABNORMAL LOW (ref 0.61–1.24)
GFR calc Af Amer: >60 mL/min (ref >60)
GFR calc Af Amer: >60 mL/min (ref >60)
GFR calc non Af Amer: >60 mL/min (ref >60)
GFR calc non Af Amer: >60 mL/min (ref >60)
Glucose, Bld: 131 mg/dL — ABNORMAL HIGH (ref 70–99)
Glucose, Bld: 108 mg/dL — ABNORMAL HIGH (ref 70–99)
Potassium: 2.8 mmol/L — ABNORMAL LOW (ref 3.5–5.1)
Potassium: 3.2 mmol/L — ABNORMAL LOW (ref 3.5–5.1)
Sodium: 137 mmol/L (ref 135–145)
Sodium: 137 mmol/L (ref 135–145)

## 2017-10-18 LAB — CBC WITH DIFFERENTIAL/PLATELET
Basophils Absolute: 0 10*3/uL (ref 0.0–0.1)
Basophils Relative: 0 %
Eosinophils Absolute: 0 10*3/uL (ref 0.0–0.7)
Eosinophils Relative: 0 %
HCT: 20.7 % — ABNORMAL LOW (ref 39.0–52.0)
Hemoglobin: 7.1 g/dL — ABNORMAL LOW (ref 13.0–17.0)
Lymphocytes Relative: 8 %
Lymphs Abs: 1.4 10*3/uL (ref 0.7–4.0)
MCH: 29.8 pg (ref 26.0–34.0)
MCHC: 34.3 g/dL (ref 30.0–36.0)
MCV: 87 fL (ref 78.0–100.0)
Monocytes Absolute: 1 10*3/uL (ref 0.1–1.0)
Monocytes Relative: 6 %
Neutro Abs: 14.6 10*3/uL — ABNORMAL HIGH (ref 1.7–7.7)
Neutrophils Relative %: 86 %
Platelets: 312 10*3/uL (ref 150–400)
RBC: 2.38 MIL/uL — ABNORMAL LOW (ref 4.22–5.81)
RDW: 16.7 % — ABNORMAL HIGH (ref 11.5–15.5)
WBC: 17 10*3/uL — ABNORMAL HIGH (ref 4.0–10.5)
nRBC: 8 /100 WBC — ABNORMAL HIGH

## 2017-10-18 LAB — PREPARE RBC (CROSSMATCH)

## 2017-10-18 LAB — MAGNESIUM: Magnesium: 2.1 mg/dL (ref 1.7–2.4)

## 2017-10-18 MED ORDER — RAMELTEON 8 MG PO TABS
8.0000 mg | ORAL_TABLET | Freq: Every evening | ORAL | Status: DC | PRN
  Administered 2017-10-18 – 2017-10-24 (×5): 8 mg via ORAL
  Filled 2017-10-18 (×9): qty 1

## 2017-10-18 MED ORDER — SODIUM CHLORIDE 0.9% IV SOLUTION: Freq: Once | INTRAVENOUS | Status: DC

## 2017-10-18 MED ORDER — HYDROMORPHONE HCL 1 MG/ML IJ SOLN
2.0000 mg | INTRAMUSCULAR | Status: DC | PRN
  Administered 2017-10-18 – 2017-10-19 (×10): 2 mg via INTRAVENOUS
  Filled 2017-10-18 (×10): qty 2

## 2017-10-18 MED ORDER — HYDROMORPHONE HCL 1 MG/ML IJ SOLN: 2.0000 mg | INTRAMUSCULAR | Status: DC | PRN

## 2017-10-18 MED ORDER — SODIUM CHLORIDE 0.9 % IV SOLN
2.0000 g | INTRAVENOUS | Status: DC
  Administered 2017-10-18 – 2017-10-21 (×4): 2 g via INTRAVENOUS
  Filled 2017-10-18: qty 2
  Filled 2017-10-18: qty 20
  Filled 2017-10-18: qty 2
  Filled 2017-10-18 (×3): qty 20

## 2017-10-18 MED ORDER — POTASSIUM CHLORIDE CRYS ER 20 MEQ PO TBCR
60.0000 meq | EXTENDED_RELEASE_TABLET | Freq: Two times a day (BID) | ORAL | Status: DC
  Administered 2017-10-18 – 2017-10-23 (×10): 60 meq via ORAL
  Filled 2017-10-18 (×10): qty 3

## 2017-10-18 MED ORDER — POTASSIUM CHLORIDE CRYS ER 20 MEQ PO TBCR: 20.0000 meq | EXTENDED_RELEASE_TABLET | Freq: Two times a day (BID) | ORAL | Status: DC

## 2017-10-18 MED ORDER — POTASSIUM CHLORIDE 10 MEQ/100ML IV SOLN: 10.0000 meq | INTRAVENOUS | Status: DC

## 2017-10-18 NOTE — Plan of Care (Addendum)
Transfer Summary  Name: Timothy Marks MRN: [id number] DOB: 11-19-1990 27 y.o. PCP: Lanae Boast, [place]  Date of Admission: 10/15/2017  7:06 AM Date of Transfer: 10/19/2017 7:00 AM  Attending Physician: Bartholomew Crews, MD Accepting Physician: [place], [place], NP  Transfer Diagnosis: 1. Sickle Cell Crisis 2. Sickle Cell Anemia 3. Hypokalemia  Hospital Course by problem list: 1. Sickle Cell Crisis: - Patient presented with sickle cell crisis in severe acute pain - Oral opioid d/ced. Started on Dilaudid 1mg  q1hr - Unable to keep up with demand due to nursing/patient ratio - Had trial of PCA but unsuccessful as patient kept pulling off oxygen monitor and locking himself out of PCA - Kept on Dilaudid 1mg  q1.5hr but patient endorsing inadequate pain control - Developed Fever (103F) on 10/18/14 with erythematous lesion on L collarbone - started on Ceftriaxone 2mg  qdaily - No longer febrile - Chest X-ray negative  2. Sickle Cell Anemia - Down trending since admission 8.3 -> 7.1 -> 6.2 - Received 1 unit of blood during admission - Post-transfusion hgb 7.1  3. Hypokalemia - Chronically hypokalemic on K-dur 65mEq at home - Was given oral supplementation as required - Since admission 3.1->3.4->2.8->3.2->3.7  Transfer Vitals:   BP 108/75   Pulse 97   Temp 99.3 F (37.4 C) (Oral)   Resp (!) 21   Ht 6\' 2"  (1.88 m)   Wt 56.7 kg   SpO2 100%   BMI 16.05 kg/m   Transfer Physical Exam  Constitutional: He is oriented to person, place, and time. He appears distressed (Curled over in pain).  Eyes: Pupils are equal, round, and reactive to light. Conjunctivae and EOM are normal. No scleral icterus.  Neck: Normal range of motion. Neck supple.  Negative meningeal signs  Cardiovascular: Regular rhythm, normal heart sounds and intact distal pulses.  Tachycardic  Pulmonary/Chest: Effort normal and breath sounds normal. No respiratory distress. He has no wheezes. He has no rales.  Abdominal:  Soft. Bowel sounds are normal. There is tenderness.  Musculoskeletal: Normal range of motion. He exhibits tenderness. He exhibits no edema or deformity.  Lymphadenopathy:    He has no cervical adenopathy.  Neurological: He is alert and oriented to person, place, and time. No cranial nerve deficit. GCS score is 15.  Skin: Skin is warm and dry. No rash noted. There is erythema (erythemaouts mildly elevated lesion over the left collarbone sternal end. Warm to touch. Tender to palpation.).  Extreme tenderness to light palpation on most areas of his upper and lower extremity   Pertinent Labs, Studies, and Procedures:  CBC Latest Ref Rng & Units 10/18/2017 10/18/2017 10/17/2017  WBC 4.0 - 10.5 K/uL 17.0(H) 18.4(H) 15.9(H)  Hemoglobin 13.0 - 17.0 g/dL 7.1(L) 6.2(LL) 7.1(L)  Hematocrit 39.0 - 52.0 % 20.7(L) 18.1(L) 20.5(L)  Platelets 150 - 400 K/uL 312 301 305   BMP Latest Ref Rng & Units 10/19/2017 10/18/2017 10/18/2017  Glucose 70 - 99 mg/dL 107(H) 108(H) 131(H)  BUN 6 - 20 mg/dL <5(L) 5(L) <5(L)  Creatinine 0.61 - 1.24 mg/dL 0.51(L) 0.56(L) 0.57(L)  BUN/Creat Ratio 9 - 20 - - -  Sodium 135 - 145 mmol/L 136 137 137  Potassium 3.5 - 5.1 mmol/L 3.7 3.2(L) 2.8(L)  Chloride 98 - 111 mmol/L 102 102 101  CO2 22 - 32 mmol/L 26 27 29   Calcium 8.9 - 10.3 mg/dL 8.5(L) 8.3(L) 8.2(L)   Chest 1 View X-ray  07/23/2017  FINDINGS: There is no appreciable edema or consolidation. Heart is borderline prominent with pulmonary  vascularity normal. No adenopathy. Bony sclerosis is consistent with known sickle cell disease.  IMPRESSION: No edema or consolidation. Heart borderline prominent. No evident adenopathy. Bony changes consistent with sickle cell disease.  Signed: Mosetta Anis, MD 10/19/2017, 6:43 AM   Pager: [phone]

## 2017-10-18 NOTE — Progress Notes (Signed)
Subjective:  Timothy Marks is a 27 y.o. with PMH of sickle cell disease admit for sickle cell crisis on hospital day 3  Timothy Marks was examined and evaluated at bedside this a.m.  He is continually requesting more pain medication. He states his pain is not controlled. Continuing to endorse extreme tenderness to light touch.  He states that his left collarbone is especially painful.  He is also endorsing weakness of his bilateral upper extremities.  No longer intermittently lucid.   His nurse states that he was barely able to tolerate his chest x-ray. Patient states he has not noticed any significant improvement since starting gabapentin yesterday. Patient denies any headaches, chest pain, blurry vision, N/V/D/C.  Objective:  Vital signs in last 24 hours: Vitals:   10/18/17 0759 10/18/17 0800 10/18/17 0900 10/18/17 1000  BP: (!) 114/52 (!) 114/52 120/84 129/60  Pulse: (!) 104 (!) 105 (!) 101 89  Resp: 16 14 20 17   Temp: 99.3 F (37.4 C)  99.8 F (37.7 C) 99.3 F (37.4 C)  TempSrc: Oral  Oral Oral  SpO2: 98% 100% 98% 97%  Weight:      Height:       Physical Exam  Constitutional: He is oriented to person, place, and time. He appears distressed (Curled over in pain).  Eyes: Pupils are equal, round, and reactive to light. Conjunctivae and EOM are normal. No scleral icterus.  Neck: Normal range of motion. Neck supple.  Negative meningeal signs  Cardiovascular: Regular rhythm, normal heart sounds and intact distal pulses.  Tachycardic  Pulmonary/Chest: Effort normal and breath sounds normal. No respiratory distress. He has no wheezes. He has no rales.  Abdominal: Soft. Bowel sounds are normal. There is tenderness.  Musculoskeletal: Normal range of motion. He exhibits tenderness. He exhibits no edema or deformity.  Lymphadenopathy:    He has no cervical adenopathy.  Neurological: He is alert and oriented to person, place, and time. No cranial nerve deficit. GCS score is 15.  Skin: Skin  is warm and dry. No rash noted. There is erythema (erythemaouts mildly elevated lesion over the left collarbone sternal end. Warm to touch. Tender to palpation.).  Extreme tenderness to light palpation on most areas of his upper and lower extremity   Assessment/Plan:  Active Problems:   Sickle cell pain crisis (HCC)   Hypokalemia   Sickle cell anemia (HCC)   Sickle cell crisis (HCC)  Timothy Marks is a 27 yo M w/ PMH of Sickle Cell Disease presentingwith sickle cell crisis. Tried weaning Dilaudid frequency to 2mg  2hr yesterday. Patient endorsing inadequate pain control. Spiked fever yesterday with leukocytosis and has been receiving ceftriaxone. X-ray negative for pneumonia. Physical exam inconsistent with meningitis. His hemoglobin declining to 6.2 this morning and received a unit of blood this morning. Patient may need transfer to sickle cell special center if his pain is not well controlled here. Difficult to increase frequency of Dilaudid due to nursing staffing limitations.  Sickle Cell Crisis Currently on Dilaudid 2mg  q2hr.  Febrile with leukocytosis and worsening anemia. No evidence of acute chest syndrome or other infarct events yet. Continuing to endorse inadequate pain control. - C/w dextrose 5%, 1/2 NS at 125cc/hr - Keep O2sat above 88 - Will do trial of Dilaudid 2mg  q1hr and see if pain is better controlled  - May need transfer to [facility] - C/w gabapentin 300mg  TID for neuropathic pain - C/w home meds: hydroxyurea 500mg  BID, Folate 1mg  daily, K-dur 4mEq daily, Vit D  Fever with  leukocytosis with unclear source during sickle cell crisis Febrile recorded at 103 yesterday PM. Intermittently febrile. 101 this AM. Worsening leukocytosis with WBC 18.4 <-15.9 <- 15.3. Treating with Tylenol and providing empiric coverage. Must be aware of increased risk of encapsulated organism infection for sickle cell patients. - C/w Ceftriaxone 2g IV daily  Hypokalemia Has history of chronic  hypokalemia on K-dur 78mEq at home.2.8 this AM - Recheck in PM - 1time dose 58mEq K-dur -C/w home regimen: K-dur 32mEqdaily  Sickle Cell Anemia Baseline hgb 9-10.Currently down-trending at 6.2  this AM from 7.1 yesterday -Transfuse 1 Unit packed RBCs this AM - Repeat CBC in PM after transfusion  Diet: Regular DVT prophx:Lovenox Bowel: Senokot, Zofran PRN for nausea Code:Full  Dispo: Anticipated discharge in approximately 3 day(s).   Mosetta Anis, MD 10/18/2017, 1:20 PM Pager: [phone]

## 2017-10-18 NOTE — Progress Notes (Signed)
CRITICAL VALUE ALERT  Critical Value:  Hgb 6.2   Date & Time Notied:  10/18/2017 0450  Provider Notified: Dr. Annie Paras  Orders Received/Actions taken: There is a note in to transfuse @ 6 or less. Provider just notified per policy.

## 2017-10-19 DIAGNOSIS — D5700 Hb-SS disease with crisis, unspecified: Principal | ICD-10-CM

## 2017-10-19 DIAGNOSIS — G894 Chronic pain syndrome: Secondary | ICD-10-CM

## 2017-10-19 DIAGNOSIS — B001 Herpesviral vesicular dermatitis: Secondary | ICD-10-CM

## 2017-10-19 DIAGNOSIS — D5702 Hb-SS disease with splenic sequestration: Secondary | ICD-10-CM

## 2017-10-19 DIAGNOSIS — E876 Hypokalemia: Secondary | ICD-10-CM

## 2017-10-19 DIAGNOSIS — R509 Fever, unspecified: Secondary | ICD-10-CM

## 2017-10-19 LAB — BASIC METABOLIC PANEL
Anion gap: 8 (ref 5–15)
BUN: <5 mg/dL — ABNORMAL LOW (ref 6–20)
CO2: 26 mmol/L (ref 22–32)
Calcium: 8.5 mg/dL — ABNORMAL LOW (ref 8.9–10.3)
Chloride: 102 mmol/L (ref 98–111)
Creatinine, Ser: 0.51 mg/dL — ABNORMAL LOW (ref 0.61–1.24)
GFR calc Af Amer: >60 mL/min (ref >60)
GFR calc non Af Amer: >60 mL/min (ref >60)
Glucose, Bld: 107 mg/dL — ABNORMAL HIGH (ref 70–99)
Potassium: 3.7 mmol/L (ref 3.5–5.1)
Sodium: 136 mmol/L (ref 135–145)

## 2017-10-19 MED ORDER — ONDANSETRON HCL 4 MG/2ML IJ SOLN: 4.0000 mg | Freq: Four times a day (QID) | INTRAMUSCULAR | Status: DC | PRN

## 2017-10-19 MED ORDER — DIPHENHYDRAMINE HCL 25 MG PO CAPS: 25.0000 mg | ORAL_CAPSULE | ORAL | Status: DC | PRN

## 2017-10-19 MED ORDER — VALACYCLOVIR HCL 500 MG PO TABS
500.0000 mg | ORAL_TABLET | Freq: Every day | ORAL | Status: DC
  Administered 2017-10-19 – 2017-10-25 (×7): 500 mg via ORAL
  Filled 2017-10-19 (×7): qty 1

## 2017-10-19 MED ORDER — LIP MEDEX EX OINT: TOPICAL_OINTMENT | CUTANEOUS | Status: DC | PRN

## 2017-10-19 MED ORDER — SODIUM CHLORIDE 0.9 % IV SOLN
25.0000 mg | INTRAVENOUS | Status: DC | PRN
  Filled 2017-10-19: qty 0.5

## 2017-10-19 MED ORDER — NALOXONE HCL 0.4 MG/ML IJ SOLN: 0.4000 mg | INTRAMUSCULAR | Status: DC | PRN

## 2017-10-19 MED ORDER — OXYCODONE HCL 5 MG PO TABS: 10.0000 mg | ORAL_TABLET | ORAL | Status: DC | PRN

## 2017-10-19 MED ORDER — HYDROMORPHONE 1 MG/ML IV SOLN
INTRAVENOUS | Status: DC
  Administered 2017-10-19: 17:00:00 via INTRAVENOUS
  Administered 2017-10-19: 8.5 mg via INTRAVENOUS
  Administered 2017-10-20: 6.5 mg via INTRAVENOUS
  Administered 2017-10-20: 22:00:00 via INTRAVENOUS
  Administered 2017-10-20: 4 mg via INTRAVENOUS
  Administered 2017-10-20: 1 mg via INTRAVENOUS
  Administered 2017-10-20: 7 mg via INTRAVENOUS
  Administered 2017-10-20: 9.5 mg via INTRAVENOUS
  Administered 2017-10-21: 4.5 mg via INTRAVENOUS
  Administered 2017-10-21: 5 mg via INTRAVENOUS
  Administered 2017-10-21: 3 mg via INTRAVENOUS
  Administered 2017-10-21: 7 mg via INTRAVENOUS
  Administered 2017-10-22: 4.5 mg via INTRAVENOUS
  Administered 2017-10-22: 20:00:00 via INTRAVENOUS
  Administered 2017-10-22: 3.85 mg via INTRAVENOUS
  Administered 2017-10-22: 02:00:00 via INTRAVENOUS
  Administered 2017-10-22: 9.5 mg via INTRAVENOUS
  Administered 2017-10-22: 6 mg via INTRAVENOUS
  Administered 2017-10-23: 7.85 mg via INTRAVENOUS
  Administered 2017-10-23: 2 mg via INTRAVENOUS
  Filled 2017-10-19 (×6): qty 25

## 2017-10-19 MED ORDER — KCL IN DEXTROSE-NACL 10-5-0.45 MEQ/L-%-% IV SOLN
INTRAVENOUS | Status: DC
  Administered 2017-10-19 – 2017-10-22 (×4): via INTRAVENOUS
  Filled 2017-10-19 (×5): qty 1000

## 2017-10-19 MED ORDER — SODIUM CHLORIDE 0.9% FLUSH: 9.0000 mL | INTRAVENOUS | Status: DC | PRN

## 2017-10-19 MED ORDER — ENOXAPARIN SODIUM 40 MG/0.4ML ~~LOC~~ SOLN
40.0000 mg | SUBCUTANEOUS | Status: DC
  Administered 2017-10-19 – 2017-10-25 (×7): 40 mg via SUBCUTANEOUS
  Filled 2017-10-19 (×7): qty 0.4

## 2017-10-19 MED ORDER — OXYCODONE HCL ER 20 MG PO T12A
20.0000 mg | EXTENDED_RELEASE_TABLET | Freq: Two times a day (BID) | ORAL | Status: DC
  Administered 2017-10-19 – 2017-10-25 (×12): 20 mg via ORAL
  Filled 2017-10-19 (×14): qty 1

## 2017-10-19 MED ORDER — HYDROMORPHONE HCL 1 MG/ML IJ SOLN: 2.0000 mg | INTRAMUSCULAR | Status: DC | PRN

## 2017-10-19 NOTE — Plan of Care (Signed)
  Problem: Education: Goal: Knowledge of vaso-occlusive preventative measures will improve Outcome: Progressing Goal: Awareness of infection prevention will improve Outcome: Progressing Goal: Awareness of signs and symptoms of anemia will improve Outcome: Progressing Goal: Long-term complications will improve Outcome: Progressing   Problem: Clinical Measurements: Goal: Ability to maintain clinical measurements within normal limits will improve Outcome: Progressing Goal: Will remain free from infection Outcome: Progressing Goal: Diagnostic test results will improve Outcome: Progressing Goal: Respiratory complications will improve Outcome: Progressing Goal: Cardiovascular complication will be avoided Outcome: Progressing

## 2017-10-19 NOTE — Progress Notes (Signed)
  Date: 10/19/2017  Patient name: Timothy Marks  Medical record number: [id number]  Date of birth: 1990-11-05   This patient's plan of care was discussed with the house staff. Please see their note for complete details. I concur with their findings. [name] was in the shower when the team and I stopped by this morning.  Dr. Truman Hayward, Dr. Trilby Drummer, the patient's nurse, and I all discussed the best way to proceed.  He was admitted with a sickle cell crisis.  He was unable to successfully use a PCA pump as he did not like the CO2 and O2 monitor.  He was then transitioned to PRN IV Dilaudid but the staff pain on the regular floor was unable to keep up with the frequency and he was therefore transferred to stepdown to get IV Dilaudid 2 mg every 90 minutes.  Stepdown unit had difficulty with this both in terms of staffing and in terms of patient's belligerent behavior.  Dr. Truman Hayward arranged for the patient to be transferred to [facility] but initially, the patient refused to be transferred.  We then discussed options going forward.  I feel it is in the patient's best interest to be treated at [facility] long in the sickle cell unit as it is becoming difficult to tease out how much of his behavior is due to pain and how much is due to baseline inappropriate behavior.  I am concerned he does have significant pain from a sickle cell crisis as his hemoglobin dropped low enough to require transfusion and he also had a fever of 103.  However, we have not been able to manage his pain with a PCA due to patient refusal of monitoring and with with PRN opioids due to staffing needs and patient's belligerent behavior.  Dr. Truman Hayward spoke to sickle cell who recommended transitioning him to oral opioids since we have not had any success with parenteral opioids and when the patient realized his choice was oral opioids at [facility] or transfer to [facility] long sickle cell center for parenteral opioids, he acquiesced to being transferred.   Bartholomew Crews, MD 10/19/2017, 2:34 PM

## 2017-10-19 NOTE — Progress Notes (Signed)
Patient is refusing transport to [facility]. States he does not want the additional hassle of coming back to pick up his car. Explained to the patient that he would get better care at [facility] considering they have sickle cell center at that location. He explained that he had prior admission at the hospital and he did not have a good experience. Explained that his current regimen of Dilaudid 2mg  q2hr is not appropriate and he should be on Sickle Cell PCA. Patient states he does not like having the Co2 monitor on which is required for the PCA to work. Informed the patient that I will be putting him on oral pain medication as nursing staff cannot handle his needs. He stated that he would prefer to stay at [facility] on oral pain medications.  Shortly after, was paged by Case Manager and informed that he had changed his mind after thinking about it. Will put in for transfer to [facility]

## 2017-10-19 NOTE — Progress Notes (Signed)
Received call from [facility] about concern of pt with hx of refusing to wear CO2 monitor while receiving medication through PCA pump and the protocol at [facility] is that pt is required to wear monitor. AD and this RN spoke with pt regarding PCA pump protocol instructions at [facility] Pt agreeable to wearing CO2 monitor while he is receiving medication through the PCA pump and stated "of course I will because I am not getting IV pain meds here". Will return call to [facility] to inform of agreeable plan.

## 2017-10-19 NOTE — Care Management Note (Signed)
Case Management Note  Patient Details  Name: Timothy Marks MRN: [id number] Date of Birth: 05-10-1990  Subjective/Objective:                 Sickle Cell Crisis   Action/Plan:  Spoke w patient at bedside. Discussed plan for transfer to [facility] as they specialize more in Sickle Cell. Patient states that he is unhappy that he is not getting the pain medicine he wants. He said that if the only way to get the medicine he wants is to transfer to [facility] then he will transfer to [facility].  Discussed this with Dr Truman Hayward and bedside nurse. Patient will transfer to [facility] today.   Expected Discharge Date:                  Expected Discharge Plan:     In-House Referral:     Discharge planning Services  CM Consult  Post Acute Care Choice:    Choice offered to:     DME Arranged:    DME Agency:     HH Arranged:    HH Agency:     Status of Service:  In process, will continue to follow  If discussed at Long Length of Stay Meetings, dates discussed:    Additional Comments:  Carles Collet, RN 10/19/2017, 1:18 PM

## 2017-10-19 NOTE — Progress Notes (Signed)
This Probation officer called and report given to [place] at [employer], currently [employer] to transport patient,

## 2017-10-19 NOTE — H&P (Signed)
H&P  Patient Demographics:  Timothy Marks, is a 27 y.o. male  MRN: [id number]   DOB - 1990/09/10  Admit Date - 10/15/2017  Outpatient Primary MD for the patient is Lanae Boast, [place]  Chief Complaint  Patient presents with  . Sickle Cell Pain Crisis      HPI:   Timothy Marks  is a 27 y.o. male with a medical history significant for sickle cell anemia and chronic pain syndrome admitted to inpatient services for sickle cell crisis. Patient was initially admitted to inpatient services on 10/16/2017 after being unresponsive to home percocet. Also, patient was recently discharged from [facility] for sickle cell crisis. Patient says that prior to admission, he had a sudden onset of significant pain and tenderness to joints, neck, and back. He attributes current pain crisis to changes in weather.  Patient transferred from inpatient services at [facility] to [place] [facility] for extended care involving current sickle cell pain crisis.  Patient states that pain has not been controlled since admission.   Patient was receiving IV Dilaudid 2 mg hourly via clinician assisted dose without relief.  Today, attempted to transition to oral pain medication regimen.  Patient states that pain had not been controlled on IV pain medications and he felt that was not appropriate to start oral medication at this time. Patient has struggled with PCA and safety parameters. He says that he had sores in his nose and was unable to wear CO2 monitor consistently.   Patient is complaining of pain all over. Current pain intensity is 9/10. Patient denies blurred vision, dizziness, abdominal pain, nausea, vomiting, or diarrhea.        Review of systems:  Review of Systems  Constitutional: Positive for fever and weight loss.  HENT: Negative.   Eyes: Negative.   Respiratory: Negative.   Cardiovascular: Negative.   Gastrointestinal: Negative.  Negative for abdominal pain, constipation and diarrhea.  Genitourinary:  Negative.   Musculoskeletal: Positive for joint pain and myalgias.  Skin:       Blisters to lips and tongue  Neurological: Negative.   Psychiatric/Behavioral: The patient is nervous/anxious.    With Past History of the following :   Past Medical History:  Diagnosis Date  . Depression   . 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 Sickle cell anemia (HCC)   . Vitamin D deficiency y-6      Past Surgical History:  Procedure Laterality Date  . CHOLECYSTECTOMY       Social History:   Social History   Tobacco Use  . Smoking status: Never Smoker  . Smokeless tobacco: Never Used  Substance Use Topics  . Alcohol use: No     Lives - At home   Family History :   Family History  Problem Relation Age of Onset  . Sickle cell trait Mother   . Sickle cell trait Father      Home Medications:   Prior to Admission medications   Medication Sig Start Date End Date Taking? Authorizing Provider  folic acid (FOLVITE) 1 MG tablet Take 1 tablet (1 mg total) by mouth daily. 05/01/17  Yes Dorena Dew, FNP  hydroxyurea (HYDREA) 500 MG capsule Take 1 capsule (500 mg total) by mouth 2 (two) times daily. May take with food to minimize GI side effects. 05/01/17  Yes Dorena Dew, FNP  ibuprofen (ADVIL,MOTRIN) 800 MG tablet Take 1 tablet (800 mg total) by mouth 3 (  three) times daily. Patient taking differently: Take 800 mg by mouth 3 (three) times daily as needed (pain).  05/01/17  Yes Dorena Dew, FNP  potassium chloride SA (K-DUR,KLOR-CON) 20 MEQ tablet Take 2 tablets (40 mEq total) by mouth daily. Patient taking differently: Take 20 mEq by mouth daily.  04/06/17  Yes Leana Gamer, MD  Vitamin D, Ergocalciferol, (DRISDOL) 50000 units CAPS capsule Take 1 capsule (50,000 Units total) by mouth every Wednesday. 09/13/17  Yes Lanae Boast, FNP  aspirin EC 81 MG tablet Take 1 tablet (81 mg total) by mouth daily. Patient  not taking: Reported on 07/16/2017 06/02/17   Dorena Dew, FNP  cholecalciferol (VITAMIN D) 1000 UNITS tablet Take 2 tablets (2,000 Units total) by mouth daily. Patient not taking: Reported on 10/15/2017 04/09/14   Leana Gamer, MD     Allergies:   Allergies  Allergen Reactions  . Buprenorphine Hcl Hives  . Levaquin [Levofloxacin] Itching  . Meperidine Rash  . Morphine And Related Hives  . Toradol [Ketorolac Tromethamine] Itching  . Tramadol Hives  . Vancomycin Itching  . Zosyn [Piperacillin Sod-Tazobactam So] Itching and Rash    Has taken rocephin in past     Physical Exam:   Vitals:   Vitals:   10/19/17 0739 10/19/17 0928  BP: 118/71   Pulse:    Resp: 16   Temp: 100 F (37.8 C) 100.3 F (37.9 C)  SpO2:      Physical Exam: Constitutional: Patient appears well-developed and well-nourished.  Patient in obvious distress, writhing in pain, unable to comfortable.  Cachexia, underweight HENT: Normocephalic, atraumatic, External right and left ear normal.  Multiple lesions throughout the oropharynx, fluid-filled blisters. Eyes: Conjunctivae and EOM are normal. PERRLA, scleral icterus  Neck: Normal ROM. Neck supple. No JVD. No tracheal deviation. No thyromegaly. CVS: RRR, S1/S2 +, no murmurs, no gallops, no carotid bruit.  Pulmonary: Effort and breath sounds normal, no stridor, rhonchi, wheezes, rales.  Abdominal: Soft. BS +, no distension, tenderness, rebound or guarding.  Musculoskeletal: Normal range of motion. No edema and no tenderness.  Lymphadenopathy: No lymphadenopathy noted, cervical, inguinal or axillary Neuro: Patient unable to tolerate light touch due to extreme pain.  Alert. Normal reflexes, muscle tone coordination. No cranial nerve deficit. Skin: Skin is warm and dry. No rash noted. Not diaphoretic. No erythema. No pallor. Psychiatric:  Agitated.  Behavior, judgment, thought content abnormal.   Data Review:   CBC Recent Labs  Lab 10/15/17 0835  10/17/17 0237 10/18/17 0307 10/18/17 1548  WBC 15.3* 15.9* 18.4* 17.0*  HGB 8.3* 7.1* 6.2* 7.1*  HCT 23.5* 20.5* 18.1* 20.7*  PLT 399 305 301 312  MCV 91.4 90.3 90.0 87.0  MCH 32.3 31.3 30.8 29.8  MCHC 35.3 34.6 34.3 34.3  RDW 20.2* 18.5* 17.0* 16.7*  LYMPHSABS 1.7  --   --  1.4  MONOABS 1.2*  --   --  1.0  EOSABS 0.2  --   --  0.0  BASOSABS 0.0  --   --  0.0   ------------------------------------------------------------------------------------------------------------------  Chemistries  Recent Labs  Lab 10/15/17 0835 10/17/17 0237 10/18/17 0307 10/18/17 0622 10/18/17 1242 10/19/17 0339  NA 140 135 137  --  137 136  K 3.1* 3.4* 2.8*  --  3.2* 3.7  CL 106 100 101  --  102 102  CO2 26 26 29   --  27 26  GLUCOSE 111* 110* 131*  --  108* 107*  BUN 5* 7 <5*  --  5* <5*  CREATININE 0.60* 0.64 0.57*  --  0.56* 0.51*  CALCIUM 8.9 8.4* 8.2*  --  8.3* 8.5*  MG  --   --   --  2.1  --   --   AST 30  --   --   --   --   --   ALT 11  --   --   --   --   --   ALKPHOS 70  --   --   --   --   --   BILITOT 3.0*  --   --   --   --   --    ------------------------------------------------------------------------------------------------------------------ estimated creatinine clearance is 109.3 mL/min (A) (by C-G formula based on SCr of 0.51 mg/dL (L)). ------------------------------------------------------------------------------------------------------------------ No results for input(s): TSH, T4TOTAL, T3FREE, THYROIDAB in the last 72 hours.  Invalid input(s): FREET3  Coagulation profile No results for input(s): INR, PROTIME in the last 168 hours. ------------------------------------------------------------------------------------------------------------------- No results for input(s): DDIMER in the last 72 hours. -------------------------------------------------------------------------------------------------------------------  Cardiac Enzymes No results for input(s): CKMB,  TROPONINI, MYOGLOBIN in the last 168 hours.  Invalid input(s): CK ------------------------------------------------------------------------------------------------------------------ No results found for: BNP  ---------------------------------------------------------------------------------------------------------------  Urinalysis    Component Value Date/Time   COLORURINE YELLOW 10/15/2017 [place] 10/15/2017 1241   LABSPEC 1.004 (L) 10/15/2017 1241   PHURINE 7.0 10/15/2017 1241   GLUCOSEU NEGATIVE 10/15/2017 1241   HGBUR MODERATE (A) 10/15/2017 1241   BILIRUBINUR NEGATIVE 10/15/2017 1241   BILIRUBINUR negative 06/01/2017 0917   KETONESUR NEGATIVE 10/15/2017 1241   PROTEINUR NEGATIVE 10/15/2017 1241   UROBILINOGEN 1.0 06/01/2017 0917   UROBILINOGEN 2.0 (H) 05/01/2017 0955   NITRITE NEGATIVE 10/15/2017 1241   LEUKOCYTESUR NEGATIVE 10/15/2017 1241    ----------------------------------------------------------------------------------------------------------------   Imaging Results:    No results found.    Assessment & Plan:  Active Problems:   Sickle cell pain crisis (HCC)   Hypokalemia   Sickle cell anemia (HCC)   Sickle cell crisis (HCC)   Fever   Sickle Cell Disease with crisis:  Admit, start IVF D5 .45 with KCL% Saline @ 100 mls/hour, Weight based Dilaudid PCA started within 30 minutes of admission, IV Toradol 30 mg Q 6 H, Monitor vitals very closely, Re-evaluate pain scale regularly, 2 L of Oxygen by [name], Patient will be re-evaluated for pain in the context of function and relationship to baseline as care progresses. Anemia of chronic disease: Hemoglobin currently 7.1.  Patient was transfused 1 unit of packed red blood cells on 10/19/2017.  Will repeat CBC in a.m.   Chronic pain syndrome:  Will continue OxyContin 20 mg every 12 hours We will hold oxycodone 10 mg every 4 hours and utilize custom dose PCA and substitute  Fever of unknown origin:   Rocephin was started 2 days ago due to fever of unknown origin.  Blood cultures were not obtained prior to initiating antibiotics.  Urinalysis unremarkable at present.  Leukocytosis present. Will continue antibiotic therapy.   Leukocytosis:  Current WBC count 17,000.  Patient has persistent low-grade temperature, will continue IV antibiotic therapy.   Herpes simplex:  We will initiate Valtrex 500 mg daily.  Will review test results with become available.  Hypokalemia:  Potassium repletion, will repeat BMP in a.m.  Cachexia: Patient is very thin, apparent cachexia. Dietary consult requested.   DVT Prophylaxis: Subcut Lovenox   AM Labs Ordered, also please review Full Orders  Family Communication: Admission, patient's condition and plan of care including tests  being ordered have been discussed with the patient who indicate understanding and agree with the plan and Code Status.  Code Status: Full Code  Consults called: None    Admission status: Inpatient    Time spent in minutes : 50 minutes  [place], MSN, FNP-C Patient [facility] [address] [place], [place] [address] [phone]  10/19/2017 at 4:02 PM

## 2017-10-19 NOTE — Progress Notes (Signed)
I spoke with the charge nurse Shanon Brow on the receiving floor at [facility]. Everything is good for the patient to be transported now. [facility] here to pick up patient now.

## 2017-10-20 DIAGNOSIS — R451 Restlessness and agitation: Secondary | ICD-10-CM

## 2017-10-20 LAB — CBC WITH DIFFERENTIAL/PLATELET
Basophils Absolute: 0 10*3/uL (ref 0.0–0.1)
Basophils Relative: 0 %
Eosinophils Absolute: 0 10*3/uL (ref 0.0–0.7)
Eosinophils Relative: 0 %
HCT: 21.6 % — ABNORMAL LOW (ref 39.0–52.0)
Hemoglobin: 7.4 g/dL — ABNORMAL LOW (ref 13.0–17.0)
Lymphocytes Relative: 11 %
Lymphs Abs: 1.3 10*3/uL (ref 0.7–4.0)
MCH: 29.5 pg (ref 26.0–34.0)
MCHC: 34.3 g/dL (ref 30.0–36.0)
MCV: 86.1 fL (ref 78.0–100.0)
Monocytes Absolute: 1.1 10*3/uL (ref 0.1–1.0)
Monocytes Relative: 9 %
Neutro Abs: 9.5 10*3/uL (ref 1.7–7.7)
Neutrophils Relative %: 80 %
Platelets: 434 10*3/uL — ABNORMAL HIGH (ref 150–400)
RBC: 2.51 MIL/uL — ABNORMAL LOW (ref 4.22–5.81)
RDW: 18 % — ABNORMAL HIGH (ref 11.5–15.5)
WBC: 11.9 10*3/uL — ABNORMAL HIGH (ref 4.0–10.5)
nRBC: 34 /100 WBC — ABNORMAL HIGH

## 2017-10-20 LAB — COMPREHENSIVE METABOLIC PANEL
ALT: 26 U/L (ref 0–44)
AST: 36 U/L (ref 15–41)
Albumin: 3.4 g/dL — ABNORMAL LOW (ref 3.5–5.0)
Alkaline Phosphatase: 72 U/L (ref 38–126)
Anion gap: 13 (ref 5–15)
BUN: 7 mg/dL (ref 6–20)
CO2: 24 mmol/L (ref 22–32)
Calcium: 9 mg/dL (ref 8.9–10.3)
Chloride: 102 mmol/L (ref 98–111)
Creatinine, Ser: 0.52 mg/dL — ABNORMAL LOW (ref 0.61–1.24)
GFR calc Af Amer: >60 mL/min (ref >60)
GFR calc non Af Amer: >60 mL/min (ref >60)
Glucose, Bld: 93 mg/dL (ref 70–99)
Potassium: 4.5 mmol/L (ref 3.5–5.1)
Sodium: 139 mmol/L (ref 135–145)
Total Bilirubin: 2 mg/dL — ABNORMAL HIGH (ref 0.3–1.2)
Total Protein: 7.1 g/dL (ref 6.5–8.1)

## 2017-10-20 LAB — HSV(HERPES SIMPLEX VRS) I + II AB-IGG
HSV 1 Glycoprotein G Ab, IgG: 32.7 index — ABNORMAL HIGH (ref 0.00–0.90)
HSV 2 Glycoprotein G Ab, IgG: <0.91 index (ref 0.00–0.90)

## 2017-10-20 MED ORDER — ENSURE ENLIVE PO LIQD
237.0000 mL | Freq: Three times a day (TID) | ORAL | Status: DC
  Administered 2017-10-20 – 2017-10-25 (×12): 237 mL via ORAL

## 2017-10-20 NOTE — Progress Notes (Signed)
Subjective: Timothy Marks, a 27 year old male with a history of sickle cell anemia and chronic pain syndrome was admitted in sickle cell crisis. Patient says that pain has improved some overnight. Current pain intensity is 6/10. Patient remains very sensitive to touch. He reports being very agitated with staff. He says that "I have not been able to rest". He has refused lab draws and vital signs this am.  Previous temperature at 0423 was 100.1, patient refused to allow staff to recheck temperature.   He says that he is also agitated because he's tired of having sickle cell disease and being in pain every day. He currently denies depression, hopelessness, anhedonia, or suicidal/homicidal ideations.   Objective:  Vital signs in last 24 hours:  Vitals:   10/19/17 2337 10/20/17 0015 10/20/17 0353 10/20/17 0423  BP: 128/73   119/71  Pulse: 89   86  Resp: 14 15 17 14   Temp: 98.7 F (37.1 C)   100.1 F (37.8 C)  TempSrc: Oral   Oral  SpO2: 100% 90% 100% 100%  Weight:      Height:        Intake/Output from previous day:   Intake/Output Summary (Last 24 hours) at 10/20/2017 1937 Last data filed at 10/20/2017 0424 Gross per 24 hour  Intake 222.56 ml  Output 2300 ml  Net -2077.44 ml    Physical Exam: General: Alert, awake, oriented x3. Patient appears to be distressed. Patient thin, cachexia HEENT: [place]/AT PEERL, EOMI.  Neck: Trachea midline,  no masses, no thyromegal,y no JVD, no carotid bruit OROPHARYNX:   Erythematous, fluid filled lesions to lips.  Heart: Regular rate and rhythm, without murmurs, rubs, gallops, PMI non-displaced, no heaves or thrills on palpation.  Lungs: Clear to auscultation, no wheezing or rhonchi noted. No increased vocal fremitus resonant to percussion  Abdomen: Soft, nontender, nondistended, positive bowel sounds, no masses no hepatosplenomegaly noted..  Neuro: No focal neurological deficits noted cranial nerves II through XII grossly intact. DTRs 2+ bilaterally  upper and lower extremities. Strength 5 out of 5 in bilateral upper and lower extremities. Musculoskeletal:  Upper/lower extremities tender to palpation. No warm swelling or erythema around joints, no spinal tenderness noted. Psychiatric: Patient alert and oriented x3. Agitated, anxious. No suicidal or homicidal thoughts.  Lymph node survey: No cervical axillary or inguinal lymphadenopathy noted.  Lab Results:  Basic Metabolic Panel:    Component Value Date/Time   NA 139 10/20/2017 0619   NA 141 09/13/2017 1425   K 4.5 10/20/2017 0619   CL 102 10/20/2017 0619   CO2 24 10/20/2017 0619   BUN 7 10/20/2017 0619   BUN 6 09/13/2017 1425   CREATININE 0.52 (L) 10/20/2017 0619   GLUCOSE 93 10/20/2017 0619   CALCIUM 9.0 10/20/2017 0619   CBC:    Component Value Date/Time   WBC 17.0 (H) 10/18/2017 1548   HGB 7.1 (L) 10/18/2017 1548   HGB 10.6 (L) 09/13/2017 1425   HCT 20.7 (L) 10/18/2017 1548   HCT 32.7 (L) 09/13/2017 1425   PLT 312 10/18/2017 1548   PLT 526 (H) 09/13/2017 1425   MCV 87.0 10/18/2017 1548   MCV 93 09/13/2017 1425   NEUTROABS 14.6 (H) 10/18/2017 1548   NEUTROABS 4.6 09/13/2017 1425   LYMPHSABS 1.4 10/18/2017 1548   LYMPHSABS 2.1 09/13/2017 1425   MONOABS 1.0 10/18/2017 1548   EOSABS 0.0 10/18/2017 1548   EOSABS 0.1 09/13/2017 1425   BASOSABS 0.0 10/18/2017 1548   BASOSABS 0.0 09/13/2017 1425  No results found for this or any previous visit (from the past 240 hour(s)).  Studies/Results: No results found.  Medications: Scheduled Meds: . enoxaparin (LOVENOX) injection  40 mg Subcutaneous Q24H  . folic acid  1 mg Oral Daily  . gabapentin  300 mg Oral TID  . HYDROmorphone   Intravenous Q4H  . hydroxyurea  500 mg Oral BID  . oxyCODONE  20 mg Oral Q12H  . potassium chloride SA  60 mEq Oral BID  . senna-docusate  1 tablet Oral BID  . valACYclovir  500 mg Oral Daily  . Vitamin D (Ergocalciferol)  50,000 Units Oral Q Wed   Continuous Infusions: . cefTRIAXone  (ROCEPHIN)  IV 2 g (10/19/17 0920)  . dextrose 5 % and 0.45 % NaCl with KCl 10 mEq/L 50 mL/hr at 10/20/17 0300  . diphenhydrAMINE     PRN Meds:.acetaminophen, diphenhydrAMINE **OR** diphenhydrAMINE, lip balm, naLOXone (NARCAN)  injection **AND** sodium chloride flush, naloxone **AND** sodium chloride flush, ondansetron (ZOFRAN) IV, polyethylene glycol, ramelteon  Consultants: Dietitian consult   Assessment/Plan: Active Problems:   Sickle cell pain crisis (HCC)   Hypokalemia   Sickle cell anemia (HCC)   Sickle cell crisis (HCC)   Fever   Chronic pain syndrome   Recurrent cold sores  Sickle Cell Disease with crisis:  Admit, start IVF D5 .45 with KCL% Saline @ 100 mls/hour, Weight based Dilaudid PCA started within 30 minutes of admission, IV Toradol 30 mg Q 6 H, Monitor vitals very closely, Re-evaluate pain scale regularly, 2 L of Oxygen by [place], Patient will be re-evaluated for pain in the context of function and relationship to baseline as care progresses. Anemia of chronic disease: Hemoglobin currently 7.1.  Patient was transfused 1 unit of packed red blood cells on 10/19/2017.  Will repeat CBC in a.m.   Chronic pain syndrome:  Will continue OxyContin 20 mg every 12 hours We will hold oxycodone 10 mg every 4 hours and utilize custom dose PCA and substitute  Fever of unknown origin:  Rocephin continued.    Leukocytosis:  Current WBC count 17,000.  Patient has persistent low-grade temperature, will continue IV antibiotic therapy.   Herpes simplex:  Valtrex 500 mg daily.  Active HSVI outbreak.  Hypokalemia:  Potassium repletion, will repeat BMP in a.m.  Cachexia: Patient is very thin, apparent cachexia. Dietary consult requested.  Agitated:  Multiple reports that patient is agitated with staff. Patient very polite during assessment. He was forthcoming about his overall frustration with having sickle cell anemia and dealing with chronic pain everyday. Patient may benefit  from psychiatric consult to determine whether there is an underlying mood disorder.      Code Status: Full Code Family Communication: N/A Disposition Plan: Not yet ready for discharge  [place], MSN, FNP-C Patient [facility] [address] [place],  [address] [phone] If 7PM-7AM, please contact night-coverage.  10/20/2017, 8:22 AM  LOS: 5 days

## 2017-10-20 NOTE — Progress Notes (Signed)
Initial Nutrition Assessment  DOCUMENTATION CODES:   Underweight, Severe malnutrition in context of chronic illness  INTERVENTION:    Ensure Enlive po TID (strawberry, vanilla only), each supplement provides 350 kcal and 20 grams of protein  NUTRITION DIAGNOSIS:   Severe Malnutrition related to chronic illness(SCA) as evidenced by percent weight loss, moderate fat depletion, moderate muscle depletion, severe muscle depletion.  GOAL:   Patient will meet greater than or equal to 90% of their needs  MONITOR:   PO intake, Supplement acceptance, Weight trends, Labs  REASON FOR ASSESSMENT:   Consult Assessment of nutrition requirement/status  ASSESSMENT:   Patient with PMH significant for sickle cell anemia, VTE, and depression.  Presents to [facility] with sickle cell pain crisis and hypokalemia. Transferred to [facility] for further management.    Pt agitated upon RD assessment, yelling "I want my lunch" immediatly upon RD entering the room. Attempted to speak with pt regarding intake PTA. Pt states "I was eating great, 4-5 times per day, steaks and all that." Suspect this is a fabrication. Pt was unwilling to elaborate. Pt slept through breakfast. He requests Ensure TID. RD to order.   He endorses a UBW of 145 lb, unsure of when he was at that weight last. Records indicate pt weighed 135 lb 07/16/17 and 125 lb this admission (7.4% wt loss in 3 months, significant for time frame). Nutrition-Focused physical exam completed.   Medications reviewed and include: folic acid, 60 mEq KCl BID, Vit D, D5 with 10 mEq KCl @ 50 ml/hr Labs reviewed: K- repleted   NUTRITION - FOCUSED PHYSICAL EXAM:    Most Recent Value  Orbital Region  Mild depletion  Upper Arm Region  Moderate depletion  Thoracic and Lumbar Region  Moderate depletion  Buccal Region  Moderate depletion  Temple Region  Moderate depletion  Clavicle Bone Region  Severe depletion  Clavicle and Acromion Bone Region  Moderate depletion   Scapular Bone Region  Moderate depletion  Dorsal Hand  Mild depletion  Patellar Region  Severe depletion  Anterior Thigh Region  Severe depletion  Posterior Calf Region  Severe depletion  Edema (RD Assessment)  None     Diet Order:   Diet Order            Diet regular Room service appropriate? Yes; Fluid consistency: Thin  Diet effective now              EDUCATION NEEDS:   Education needs have been addressed  Skin:  Skin Assessment: Reviewed RN Assessment  Last BM:  10/15/17  Height:   Ht Readings from Last 1 Encounters:  10/15/17 6\' 2"  (1.88 m)    Weight:   Wt Readings from Last 1 Encounters:  10/15/17 56.7 kg    Ideal Body Weight:  86.4 kg  BMI:  Body mass index is 16.05 kg/m.  Estimated Nutritional Needs:   Kcal:  1850-2050 kcal  Protein:  90-105 grams   Fluid:  >/= 1.8 L/day  Mariana Single RD, LDN Clinical Nutrition Pager # - [phone]

## 2017-10-20 NOTE — Progress Notes (Signed)
Pt  Refused to have full VS taken .He was verbally abusive  to the writer and the Na.

## 2017-10-21 DIAGNOSIS — G8929 Other chronic pain: Secondary | ICD-10-CM

## 2017-10-21 LAB — TYPE AND SCREEN
ABO/RH(D): A POS
Antibody Screen: NEGATIVE
Donor AG Type: NEGATIVE
Donor AG Type: NEGATIVE
Unit division: 0
Unit division: 0

## 2017-10-21 LAB — BPAM RBC
Blood Product Expiration Date: 201909222359
Blood Product Expiration Date: 201909222359
ISSUE DATE / TIME: 201909040700
Unit Type and Rh: 600
Unit Type and Rh: 600

## 2017-10-21 MED ORDER — MUSCLE RUB 10-15 % EX CREA
TOPICAL_CREAM | CUTANEOUS | Status: DC | PRN
  Administered 2017-10-21 (×3): via TOPICAL
  Filled 2017-10-21: qty 85

## 2017-10-21 NOTE — Progress Notes (Signed)
Syringe ended 3 cc Hydromorphone wasted per 2 RN verification.

## 2017-10-21 NOTE — Progress Notes (Signed)
Patient ID: Timothy Marks, male   DOB: 16-Jan-1991, 27 y.o.   MRN: [id number] Subjective:  Patient is seen reeling in pain, holding on to his left thigh and crying aloud, he claims his left thighs feel swollen and extremely painful, he is unable to ambulate. He denies any fever, he denies chest pain or SOB. Pain is 10/10 in his thigh but 7/10 in other areas.  Objective:  Vital signs in last 24 hours:  Vitals:   10/21/17 0400 10/21/17 0402 10/21/17 0800 10/21/17 1134  BP:  125/71    Pulse:  83    Resp: 14 13 10 14   Temp:  98.6 F (37 C)    TempSrc:      SpO2: 100% 100% 100% 100%  Weight:      Height:       Intake/Output from previous day:   Intake/Output Summary (Last 24 hours) at 10/21/2017 1238 Last data filed at 10/21/2017 0900 Gross per 24 hour  Intake -  Output 2175 ml  Net -2175 ml   Physical Exam: General: Alert, awake, oriented x3, in no acute distress. Cachexic HEENT: [place]/AT PEERL, EOMI Neck: Trachea midline,  no masses, no thyromegal,y no JVD, no carotid bruit OROPHARYNX:  Moist, No exudate/ erythema/lesions.  Heart: Regular rate and rhythm, without murmurs, rubs, gallops, PMI non-displaced, no heaves or thrills on palpation.  Lungs: Clear to auscultation, no wheezing or rhonchi noted. No increased vocal fremitus resonant to percussion  Abdomen: Soft, nontender, nondistended, positive bowel sounds, no masses no hepatosplenomegaly noted..  Neuro: No focal neurological deficits noted cranial nerves II through XII grossly intact. DTRs 2+ bilaterally upper and lower extremities. Strength 5 out of 5 in bilateral upper and lower extremities. Musculoskeletal: No warm swelling or erythema around joints, no spinal tenderness noted. Exquisitely tender and sensitive to touch. No redness, no obvious swelling. Psychiatric: Patient alert and oriented x3, good insight and cognition, good recent to remote recall. Lymph node survey: No cervical axillary or inguinal lymphadenopathy  noted.  Lab Results:  Basic Metabolic Panel:    Component Value Date/Time   NA 139 10/20/2017 0619   NA 141 09/13/2017 1425   K 4.5 10/20/2017 0619   CL 102 10/20/2017 0619   CO2 24 10/20/2017 0619   BUN 7 10/20/2017 0619   BUN 6 09/13/2017 1425   CREATININE 0.52 (L) 10/20/2017 0619   GLUCOSE 93 10/20/2017 0619   CALCIUM 9.0 10/20/2017 0619   CBC:    Component Value Date/Time   WBC 11.9 (H) 10/20/2017 0856   HGB 7.4 (L) 10/20/2017 0856   HGB 10.6 (L) 09/13/2017 1425   HCT 21.6 (L) 10/20/2017 0856   HCT 32.7 (L) 09/13/2017 1425   PLT 434 (H) 10/20/2017 0856   PLT 526 (H) 09/13/2017 1425   MCV 86.1 10/20/2017 0856   MCV 93 09/13/2017 1425   NEUTROABS 9.5 10/20/2017 0856   NEUTROABS 4.6 09/13/2017 1425   LYMPHSABS 1.3 10/20/2017 0856   LYMPHSABS 2.1 09/13/2017 1425   MONOABS 1.1 10/20/2017 0856   EOSABS 0.0 10/20/2017 0856   EOSABS 0.1 09/13/2017 1425   BASOSABS 0.0 10/20/2017 0856   BASOSABS 0.0 09/13/2017 1425   Studies/Results: No results found.  Medications: Scheduled Meds: . enoxaparin (LOVENOX) injection  40 mg Subcutaneous Q24H  . feeding supplement (ENSURE ENLIVE)  237 mL Oral TID BM  . folic acid  1 mg Oral Daily  . gabapentin  300 mg Oral TID  . HYDROmorphone   Intravenous Q4H  . hydroxyurea  500 mg Oral BID  . oxyCODONE  20 mg Oral Q12H  . potassium chloride SA  60 mEq Oral BID  . senna-docusate  1 tablet Oral BID  . valACYclovir  500 mg Oral Daily  . Vitamin D (Ergocalciferol)  50,000 Units Oral Q Wed   Continuous Infusions: . cefTRIAXone (ROCEPHIN)  IV 2 g (10/21/17 0936)  . dextrose 5 % and 0.45 % NaCl with KCl 10 mEq/L 50 mL/hr at 10/20/17 1804  . diphenhydrAMINE     PRN Meds:.acetaminophen, diphenhydrAMINE **OR** diphenhydrAMINE, lip balm, MUSCLE RUB, naLOXone (NARCAN)  injection **AND** sodium chloride flush, naloxone **AND** sodium chloride flush, ondansetron (ZOFRAN) IV, polyethylene glycol, ramelteon  Assessment/Plan: Active  Problems:   Sickle cell pain crisis (HCC)   Hypokalemia   Sickle cell anemia (HCC)   Sickle cell crisis (HCC)   Fever   Chronic pain syndrome   Recurrent cold sores   Agitation  1. Hb Sickle Cell Disease with crisis: Continue IVF at the current rate, continue weight based Dilaudid PCA, IV Toradol 30 mg Q 6 H, Monitor vitals very closely, Re-evaluate pain scale regularly, 2 L of Oxygen by [place]. 2. Sickle Cell Anemia: Hb has been stable post transfusion. He has no symptom of anemia. Continue to monitor 3. Chronic pain Syndrome: Will continue home pain medications: Oxycontin and Oxycodone 4. Fever of Unknown Origin: Patient has been on antibiotics, fever has subsided, blood Cx negative  5. Hypokalemia: Replete  6. Cachexia: BMI of about 16. Nutritional consult requested. Patient denies anorexia.  Code Status: Full Code Family Communication: N/A Disposition Plan: Not yet ready for discharge  Kynisha Memon  If 7PM-7AM, please contact night-coverage.  10/21/2017, 12:38 PM  LOS: 6 days

## 2017-10-21 NOTE — Consult Note (Signed)
[facility] Consult   Reason for Consult: agitation, patient says he is tired of being sick Referring Physician:  Smith Robert, NP Patient Identification: Timothy Marks MRN:  [id number] Principal Diagnosis: Adjustment disorder with disturbance of conduct Diagnosis:   Patient Active Problem List   Diagnosis Date Noted  . Agitation [R45.1]   . Chronic pain syndrome [G89.4]   . Recurrent cold sores [B00.1]   . Fever [R50.9]   . Pain [R52]   . Sickle cell crisis ([place]) [D57.00] 07/16/2017  . Adjustment disorder with disturbance of conduct [F43.24]   . Constipation [K59.00] 08/03/2014  . h/o Priapism [N48.30] 05/06/2014  . Protein-calorie malnutrition, severe ([place]) [E43] 04/09/2014  . Major depressive disorder, recurrent, severe without psychotic features ([place]) [F33.2]   . 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 Vitamin D deficiency [E55.9]   . Sickle cell anemia ([place]) [D57.1] 03/30/2014  . Hyperbilirubinemia [E80.6] 03/30/2014  . Hypokalemia [E87.6] 02/07/2014  . Aggressive behavior [R46.89] 01/25/2013  . Sickle cell pain crisis ([place]) [D57.00] 01/24/2013    Total Time spent with patient: 45 minutes  Subjective:   Timothy Marks is a 27 y.o. male patient admitted with sickle cell crisis/agitation.  HPI:  Patient who reports previous history of depression but long history of sickle cell disease/chronic pain. Patient reports that he was admitted for  sickle cell crisis. He states that he told his nurse that he is tired  of being sick but never stated he was going to hurt himself or other people. Today, patient reports that pain has improved a lot, he denies feeling depression, psychosis, delusional, suicidal/homicidal ideation, intent or plan.   Past Psychiatric History: patient denies  Risk to Self: Is patient at risk for suicide?: No Risk to Others:  denies Prior Inpatient Therapy:  denies Prior Outpatient Therapy:  none  Past  Medical History:  Past Medical History:  Diagnosis Date  . Depression   . 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 Sickle cell anemia (HCC)   . Vitamin D deficiency y-6    Past Surgical History:  Procedure Laterality Date  . CHOLECYSTECTOMY     Family History:  Family History  Problem Relation Age of Onset  . Sickle cell trait Mother   . Sickle cell trait Father    Family Psychiatric  History:  Social History:  Social History   Substance and Sexual Activity  Alcohol Use No     Social History   Substance and Sexual Activity  Drug Use No  . Types: Marijuana   Comment: denies use 05/01/2017    Social History   Socioeconomic History  . Marital status: Single    Spouse name: Not on file  . Number of children: Not on file  . Years of education: Not on file  . Highest education level: Not on file  Occupational History  . Not on file  Social Needs  . Financial resource strain: Not on file  . Food insecurity:    Worry: Not on file    Inability: Not on file  . Transportation needs:    Medical: Not on file    Non-medical: Not on file  Tobacco Use  . Smoking status: Never Smoker  . Smokeless tobacco: Never Used  Substance and Sexual Activity  . Alcohol use: No  . Drug use: No    Types: Marijuana    Comment: denies use 05/01/2017  . Sexual activity:  Not on file  Lifestyle  . Physical activity:    Days per week: Not on file    Minutes per session: Not on file  . Stress: Not on file  Relationships  . Social connections:    Talks on phone: Not on file    Gets together: Not on file    Attends religious service: Not on file    Active member of club or organization: Not on file    Attends meetings of clubs or organizations: Not on file    Relationship status: Not on file  Other Topics Concern  . Not on file  Social History Narrative  . Not on file   Additional Social History:    Allergies:    Allergies  Allergen Reactions  . Buprenorphine Hcl Hives  . Levaquin [Levofloxacin] Itching  . Meperidine Rash  . Morphine And Related Hives  . Toradol [Ketorolac Tromethamine] Itching  . Tramadol Hives  . Vancomycin Itching  . Zosyn [Piperacillin Sod-Tazobactam So] Itching and Rash    Has taken rocephin in past    Labs:  Results for orders placed or performed during the hospital encounter of 10/15/17 (from the past 48 hour(s))  HSV(herpes simplex vrs) 1+2 ab-IgG     Status: Abnormal   Collection Time: 10/19/17  4:56 PM  Result Value Ref Range   HSV 1 Glycoprotein G Ab, IgG 32.70 (H) 0.00 - 0.90 index    Comment: (NOTE)                                 Negative        <0.91                                 Equivocal 0.91 - 1.09                                 Positive        >1.09 Note: Negative indicates no antibodies detected to HSV-1. Equivocal may suggest early infection.  If clinically appropriate, retest at later date. 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    HSV 2 Glycoprotein G Ab, IgG <0.91 0.00 - 0.90 index    Comment: (NOTE)                                 Negative        <0.91                                 Equivocal 0.91 - 1.09                                 Positive        >1.09 Note: Negative indicates no antibodies detected to HSV-2. Equivocal may suggest early infection.  If clinically appropriate, retest at later date. Positive indicates antibodies detected to HSV-2. Performed At: [facility] [place], [place] [id number] Rush Farmer MD AF:[id number]   Comprehensive metabolic panel     Status: Abnormal   Collection Time: 10/20/17  6:19 AM  Result Value Ref Range   Sodium 139  135 - 145 mmol/L   Potassium 4.5 3.5 - 5.1 mmol/L   Chloride 102 98 - 111 mmol/L   CO2 24 22 - 32 mmol/L   Glucose, Bld 93 70 - 99 mg/dL   BUN 7 6 - 20 mg/dL   Creatinine, Ser 0.52 (L) 0.61 - 1.24 mg/dL   Calcium 9.0 8.9 - 10.3 mg/dL    Total Protein 7.1 6.5 - 8.1 g/dL   Albumin 3.4 (L) 3.5 - 5.0 g/dL   AST 36 15 - 41 U/L   ALT 26 0 - 44 U/L   Alkaline Phosphatase 72 38 - 126 U/L   Total Bilirubin 2.0 (H) 0.3 - 1.2 mg/dL   GFR calc non Af Amer >60 >60 mL/min   GFR calc Af Amer >60 >60 mL/min    Comment: (NOTE) The eGFR has been calculated using the CKD EPI equation. This calculation has not been validated in all clinical situations. eGFR's persistently <60 mL/min signify possible Chronic Kidney Disease.    Anion gap 13 5 - 15    Comment: Performed at [facility], [place] [address] [place], [place] [address]  CBC with Differential/Platelet     Status: Abnormal   Collection Time: 10/20/17  8:56 AM  Result Value Ref Range   WBC 11.9 (H) 4.0 - 10.5 K/uL    Comment: WHITE COUNT CONFIRMED ON SMEAR ADJUSTED FOR NUCLEATED RBC'S    RBC 2.51 (L) 4.22 - 5.81 MIL/uL   Hemoglobin 7.4 (L) 13.0 - 17.0 g/dL   HCT 21.6 (L) 39.0 - 52.0 %   MCV 86.1 78.0 - 100.0 fL   MCH 29.5 26.0 - 34.0 pg   MCHC 34.3 30.0 - 36.0 g/dL   RDW 18.0 (H) 11.5 - 15.5 %   Platelets 434 (H) 150 - 400 K/uL   Neutrophils Relative % 80 %   Neutro Abs 9.5 1.7 - 7.7 K/uL   Lymphocytes Relative 11 %   Lymphs Abs 1.3 0.7 - 4.0 K/uL   Monocytes Relative 9 %   Monocytes Absolute 1.1 0.1 - 1.0 K/uL   Eosinophils Relative 0 %   Eosinophils Absolute 0.0 0.0 - 0.7 K/uL   Basophils Relative 0 %   Basophils Absolute 0.0 0.0 - 0.1 K/uL   RBC Morphology POLYCHROMASIA PRESENT     Comment: TARGET CELLS   nRBC 34 (H) 0 /100 WBC    Comment: Performed at [facility], [place] [address] [place], [place] [address]    Current Facility-Administered Medications  Medication Dose Route Frequency Provider Last Rate Last Dose  . acetaminophen (TYLENOL) tablet 650 mg  650 mg Oral Q6H PRN Mosetta Anis, MD   650 mg at 10/19/17 5686  . cefTRIAXone (ROCEPHIN) 2 g in sodium chloride 0.9 % 100 mL IVPB  2 g Intravenous Q24H Mosetta Anis, MD  200 mL/hr at 10/21/17 0936 2 g at 10/21/17 0936  . dextrose 5 % and 0.45 % NaCl with KCl 10 mEq/L infusion   Intravenous Continuous Dorena Dew, FNP 50 mL/hr at 10/20/17 1804    . diphenhydrAMINE (BENADRYL) capsule 25 mg  25 mg Oral Q4H PRN Dorena Dew, FNP       Or  . diphenhydrAMINE (BENADRYL) 25 mg in sodium chloride 0.9 % 50 mL IVPB  25 mg Intravenous Q4H PRN Cammie Sickle M, FNP      . enoxaparin (LOVENOX) injection 40 mg  40 mg Subcutaneous Q24H Mosetta Anis, MD   40 mg  at 10/21/17 0806  . feeding supplement (ENSURE ENLIVE) (ENSURE ENLIVE) liquid 237 mL  237 mL Oral TID BM Dorena Dew, FNP   237 mL at 10/20/17 1955  . folic acid (FOLVITE) tablet 1 mg  1 mg Oral Daily Mosetta Anis, MD   1 mg at 10/21/17 0939  . gabapentin (NEURONTIN) capsule 300 mg  300 mg Oral TID Mosetta Anis, MD   300 mg at 10/21/17 2023  . HYDROmorphone (DILAUDID) 1 mg/mL PCA injection   Intravenous Q4H Dorena Dew, FNP   3 mg at 10/21/17 1223  . hydroxyurea (HYDREA) capsule 500 mg  500 mg Oral BID Mosetta Anis, MD   500 mg at 10/21/17 0939  . lip balm (CARMEX) ointment   Topical PRN Dorena Dew, FNP      . MUSCLE RUB CREA   Topical PRN Tresa Garter, MD      . naloxone [facility]) injection 0.4 mg  0.4 mg Intravenous PRN Mosetta Anis, MD       And  . sodium chloride flush (NS) 0.9 % injection 9 mL  9 mL Intravenous PRN Mosetta Anis, MD      . naloxone [facility]) injection 0.4 mg  0.4 mg Intravenous PRN Dorena Dew, FNP       And  . sodium chloride flush (NS) 0.9 % injection 9 mL  9 mL Intravenous PRN Dorena Dew, FNP      . ondansetron [facility]) injection 4 mg  4 mg Intravenous Q6H PRN Dorena Dew, FNP      . oxyCODONE (OXYCONTIN) 12 hr tablet 20 mg  20 mg Oral Q12H Mosetta Anis, MD   20 mg at 10/21/17 3435  . polyethylene glycol (MIRALAX / GLYCOLAX) packet 17 g  17 g Oral Daily PRN Mosetta Anis, MD      . potassium chloride SA (K-DUR,KLOR-CON) CR tablet 60  mEq  60 mEq Oral BID Mosetta Anis, MD   60 mEq at 10/21/17 0938  . ramelteon (ROZEREM) tablet 8 mg  8 mg Oral QHS PRN Mosetta Anis, MD   8 mg at 10/20/17 6861  . senna-docusate (Senokot-S) tablet 1 tablet  1 tablet Oral BID Mosetta Anis, MD   1 tablet at 10/19/17 2233  . valACYclovir (VALTREX) tablet 500 mg  500 mg Oral Daily Dorena Dew, FNP   500 mg at 10/21/17 6837  . Vitamin D (Ergocalciferol) (DRISDOL) capsule 50,000 Units  50,000 Units Oral Q Ethel Rana, MD   50,000 Units at 10/18/17 1023    Musculoskeletal: Strength & Muscle Tone: within normal limits Gait & Station: not tested Patient leans: N/A  Psychiatric Specialty Exam: Physical Exam  Psychiatric: His speech is normal. Judgment and thought content normal. His affect is labile. He is agitated. Cognition and memory are normal.    Review of Systems  Constitutional: Negative.   HENT: Negative.   Gastrointestinal: Negative.   Genitourinary: Negative.   Musculoskeletal: Positive for joint pain and myalgias.  Skin: Negative.   Neurological: Negative.   Endo/Heme/Allergies: Negative.   Psychiatric/Behavioral: Negative.     Blood pressure 125/71, pulse 83, temperature 98.6 F (37 C), resp. rate 14, height 6' 2"  (1.88 m), weight 56.7 kg, SpO2 100 %.Body mass index is 16.05 kg/m.  General Appearance: Casual  Eye Contact:  Good  Speech:  Clear and Coherent  Volume:  Increased  Mood:  Euthymic  Affect:  Appropriate  Thought Process:  Coherent  Orientation:  Full (Time, Place, and Person)  Thought Content:  Logical  Suicidal Thoughts:  No  Homicidal Thoughts:  No  Memory:  Immediate;   Good Recent;   Good Remote;   Good  Judgement:  Intact  Insight:  Fair  Psychomotor Activity:  Normal  Concentration:  Concentration: Good and Attention Span: Good  Recall:  Good  Fund of Knowledge:  Good  Language:  Good  Akathisia:  No  Handed:  Right  AIMS (if indicated):     Assets:  Communication Skills Desire  for Improvement  ADL's:  Intact  Cognition:  WNL  Sleep:   fair     Treatment Plan Summary:  27 year old male with a history of sickle cell anemia and chronic pain syndrome who was admitted to the hospital due to sickle cell crisis and agitation. Patient now reports improved pain condition. He is alert, oriented, calm, cooperative, denies depression, psychosis, delusions, SI and HI.   Recommendations: -Continue gabapentin for mood stabilization. -Consider referring patient for outpatient counseling upon discharge -Patient is cleared by psych service, he does not appear to be a danger to self or others.  Disposition: No evidence of imminent risk to self or others at present.   Patient does not meet criteria for psychiatric inpatient admission. Supportive therapy provided about ongoing stressors.  Corena Pilgrim, MD 10/21/2017 12:44 PM

## 2017-10-22 NOTE — Progress Notes (Signed)
Patient ID: Timothy Marks, male   DOB: Jul 13, 1990, 27 y.o.   MRN: [id number] Subjective:  Patient feels slightly better but still not fully ambulant, claims left thigh pain is better with the use of muscle rub. He denies any fever, no chest pain, no SOB  Objective:  Vital signs in last 24 hours:  Vitals:   10/22/17 0513 10/22/17 0809 10/22/17 1256 10/22/17 1449  BP:    118/66  Pulse:    88  Resp: 13 15 11 14   Temp:    98.3 F (36.8 C)  TempSrc:    Oral  SpO2: 99% 100% 100% 100%  Weight:      Height:        Intake/Output from previous day:   Intake/Output Summary (Last 24 hours) at 10/22/2017 1510 Last data filed at 10/22/2017 1234 Gross per 24 hour  Intake 1130.03 ml  Output 3275 ml  Net -2144.97 ml    Physical Exam: General: Alert, awake, oriented x3, in no acute distress. Cachexic HEENT: [place]/AT [place]ER[place], EOMI Neck: Trachea midline,  no masses, no thyromegal,y no JVD, no carotid bruit OROPHARYNX:  Moist, No exudate/ erythema/lesions.  Heart: Regular rate and rhythm, without murmurs, rubs, gallops, PMI non-displaced, no heaves or thrills on palpation.  Lungs: Clear to auscultation, no wheezing or rhonchi noted. No increased vocal fremitus resonant to percussion  Abdomen: Soft, nontender, nondistended, positive bowel sounds, no masses no hepatosplenomegaly noted..  Neuro: No focal neurological deficits noted cranial nerves II through XII grossly intact. DTRs 2+ bilaterally upper and lower extremities. Strength 5 out of 5 in bilateral upper and lower extremities. Musculoskeletal: No warm swelling or erythema around joints, no spinal tenderness noted. Psychiatric: Patient alert and oriented x3, good insight and cognition, good recent to remote recall. Lymph node survey: No cervical axillary or inguinal lymphadenopathy noted.  Lab Results:  Basic Metabolic Panel:    Component Value Date/Time   NA 139 10/20/2017 0619   NA 141 09/13/2017 1425   K 4.5 10/20/2017 0619   CL 102  10/20/2017 0619   CO2 24 10/20/2017 0619   BUN 7 10/20/2017 0619   BUN 6 09/13/2017 1425   CREATININE 0.52 (L) 10/20/2017 0619   GLUCOSE 93 10/20/2017 0619   CALCIUM 9.0 10/20/2017 0619   CBC:    Component Value Date/Time   WBC 11.9 (H) 10/20/2017 0856   HGB 7.4 (L) 10/20/2017 0856   HGB 10.6 (L) 09/13/2017 1425   HCT 21.6 (L) 10/20/2017 0856   HCT 32.7 (L) 09/13/2017 1425   PLT 434 (H) 10/20/2017 0856   PLT 526 (H) 09/13/2017 1425   MCV 86.1 10/20/2017 0856   MCV 93 09/13/2017 1425   NEUTROABS 9.5 10/20/2017 0856   NEUTROABS 4.6 09/13/2017 1425   LYMPHSABS 1.3 10/20/2017 0856   LYMPHSABS 2.1 09/13/2017 1425   MONOABS 1.1 10/20/2017 0856   EOSABS 0.0 10/20/2017 0856   EOSABS 0.1 09/13/2017 1425   BASOSABS 0.0 10/20/2017 0856   BASOSABS 0.0 09/13/2017 1425    No results found for this or any previous visit (from the past 240 hour(s)).  Studies/Results: No results found.  Medications: Scheduled Meds: . enoxaparin (LOVENOX) injection  40 mg Subcutaneous Q24H  . feeding supplement (ENSURE ENLIVE)  237 mL Oral TID BM  . folic acid  1 mg Oral Daily  . gabapentin  300 mg Oral TID  . HYDROmorphone   Intravenous Q4H  . hydroxyurea  500 mg Oral BID  . oxyCODONE  20 mg Oral Q12H  .  potassium chloride SA  60 mEq Oral BID  . senna-docusate  1 tablet Oral BID  . valACYclovir  500 mg Oral Daily  . Vitamin D (Ergocalciferol)  50,000 Units Oral Q Wed   Continuous Infusions: . dextrose 5 % and 0.45 % NaCl with KCl 10 mEq/L 10 mL/hr at 10/22/17 1056  . diphenhydrAMINE     PRN Meds:.acetaminophen, diphenhydrAMINE **OR** diphenhydrAMINE, lip balm, MUSCLE RUB, naLOXone (NARCAN)  injection **AND** sodium chloride flush, naloxone **AND** sodium chloride flush, ondansetron (ZOFRAN) IV, polyethylene glycol, ramelteon   Assessment/Plan: Principal Problem:   Adjustment disorder with disturbance of conduct Active Problems:   Sickle cell pain crisis (HCC)   Hypokalemia   Sickle cell  anemia (HCC)   Sickle cell crisis (HCC)   Fever   Chronic pain syndrome   Recurrent cold sores   Agitation  1. Hb Sickle Cell Disease with crisis: Reduce IVF to [facility], wean weight based Dilaudid PCA, D/C IV Toradol 30 mg Q 6 H (>5 days), Monitor vitals very closely, Re-evaluate pain scale regularly, 2 L of Oxygen by [place]. 2. Sickle Cell Anemia: Hb has been stable post transfusion. He has no symptom of anemia. Continue to monitor 3. Chronic pain Syndrome: Will continue home pain medications: Oxycontin and Oxycodone 4. Fever of Unknown Origin: Patient has been on antibiotics, fever has subsided, blood Cx negative; D/C antibiotics, monitor temperature closely 5. Hypokalemia: Replete  6. Cachexia: BMI of about 16. Nutritional consult requested. Patient denies anorexia. 7. Agitation: Discussed with Psychiatrist, cleared, no new medication.  Code Status: Full Code Family Communication: N/A Disposition Plan: Not yet ready for discharge  Timothy Marks  If 7PM-7AM, please contact night-coverage.  10/22/2017, 3:10 PM  LOS: 7 days

## 2017-10-23 DIAGNOSIS — F4324 Adjustment disorder with disturbance of conduct: Secondary | ICD-10-CM

## 2017-10-23 DIAGNOSIS — R5381 Other malaise: Secondary | ICD-10-CM

## 2017-10-23 LAB — COMPREHENSIVE METABOLIC PANEL
ALT: 30 U/L (ref 0–44)
AST: 33 U/L (ref 15–41)
Albumin: 4.3 g/dL (ref 3.5–5.0)
Alkaline Phosphatase: 93 U/L (ref 38–126)
Anion gap: 11 (ref 5–15)
BUN: 18 mg/dL (ref 6–20)
CO2: 30 mmol/L (ref 22–32)
Calcium: 10.5 mg/dL — ABNORMAL HIGH (ref 8.9–10.3)
Chloride: 95 mmol/L — ABNORMAL LOW (ref 98–111)
Creatinine, Ser: 0.69 mg/dL (ref 0.61–1.24)
GFR calc Af Amer: >60 mL/min (ref >60)
GFR calc non Af Amer: >60 mL/min (ref >60)
Glucose, Bld: 107 mg/dL — ABNORMAL HIGH (ref 70–99)
Potassium: 6.6 mmol/L (ref 3.5–5.1)
Sodium: 136 mmol/L (ref 135–145)
Total Bilirubin: 1.8 mg/dL — ABNORMAL HIGH (ref 0.3–1.2)
Total Protein: 9.4 g/dL — ABNORMAL HIGH (ref 6.5–8.1)

## 2017-10-23 LAB — CBC WITH DIFFERENTIAL/PLATELET
Basophils Absolute: 0 10*3/uL (ref 0.0–0.1)
Basophils Relative: 0 %
Eosinophils Absolute: 0.5 10*3/uL (ref 0.0–0.7)
Eosinophils Relative: 4 %
HCT: 24.6 % — ABNORMAL LOW (ref 39.0–52.0)
Hemoglobin: 8.2 g/dL — ABNORMAL LOW (ref 13.0–17.0)
Lymphocytes Relative: 23 %
Lymphs Abs: 3.2 10*3/uL (ref 0.7–4.0)
MCH: 28.3 pg (ref 26.0–34.0)
MCHC: 33.3 g/dL (ref 30.0–36.0)
MCV: 84.8 fL (ref 78.0–100.0)
Monocytes Absolute: 1.4 10*3/uL — ABNORMAL HIGH (ref 0.1–1.0)
Monocytes Relative: 10 %
Neutro Abs: 8.6 10*3/uL — ABNORMAL HIGH (ref 1.7–7.7)
Neutrophils Relative %: 63 %
Platelets: 812 10*3/uL — ABNORMAL HIGH (ref 150–400)
RBC: 2.9 MIL/uL — ABNORMAL LOW (ref 4.22–5.81)
RDW: 18.5 % — ABNORMAL HIGH (ref 11.5–15.5)
WBC: 13.7 10*3/uL — ABNORMAL HIGH (ref 4.0–10.5)

## 2017-10-23 MED ORDER — OXYCODONE HCL 5 MG PO TABS
10.0000 mg | ORAL_TABLET | ORAL | Status: DC | PRN
  Administered 2017-10-23 – 2017-10-25 (×8): 10 mg via ORAL
  Filled 2017-10-23 (×7): qty 2

## 2017-10-23 MED ORDER — HYDROMORPHONE 1 MG/ML IV SOLN
INTRAVENOUS | Status: DC
  Administered 2017-10-23: 15:00:00 via INTRAVENOUS
  Administered 2017-10-24: 6.5 mg via INTRAVENOUS
  Administered 2017-10-24: 06:00:00 via INTRAVENOUS
  Filled 2017-10-23 (×2): qty 25

## 2017-10-23 MED ORDER — DEXTROSE-NACL 5-0.45 % IV SOLN
INTRAVENOUS | Status: DC
  Administered 2017-10-23: 13:00:00 via INTRAVENOUS

## 2017-10-23 MED ORDER — SODIUM CHLORIDE 0.9 % IV SOLN
INTRAVENOUS | Status: DC | PRN
  Administered 2017-10-23: 1000 mL via INTRAVENOUS

## 2017-10-23 NOTE — Progress Notes (Signed)
Subjective: Timothy Marks, a 27 year old male with a history of sickle cell anemia and chronic pain syndrome was admitted in sickle cell crisis. Patient says that pain intensity is 4-5/10. Patient says that pain is controlled while lying down. Usually pain intensity increases when attempting to stand. Patient has been unable to stand or walk without maximum assistance. He is also utilizing a walker to assist with standing. Patient was attempting to stand on arrival and is very tearful.   Patient has remained afebrile overnight. Patient denies headache, chest pain, shortness of breath, nausea, vomiting, diarrhea, or constipation.   Objective:  Vital signs in last 24 hours:  Vitals:   10/23/17 0015 10/23/17 0412 10/23/17 0414 10/23/17 0817  BP:  124/65    Pulse:  65    Resp: 19 15 11 11   Temp:  99.2 F (37.3 C)    TempSrc:  Oral    SpO2: 100% 100%    Weight:      Height:        Intake/Output from previous day:   Intake/Output Summary (Last 24 hours) at 10/23/2017 1006 Last data filed at 10/23/2017 0800 Gross per 24 hour  Intake 450.65 ml  Output 1975 ml  Net -1524.35 ml    Physical Exam: General: Alert, awake, oriented x3. Patient appears to be distressed. Patient thin, cachexia HEENT: [place]/AT PEERL, EOMI.  Neck: Trachea midline,  no masses, no thyromegal,y no JVD, no carotid bruit OROPHARYNX:   Erythematous, fluid filled lesions to lips. Heart: Regular rate and rhythm, without murmurs, rubs, gallops, PMI non-displaced, no heaves or thrills on palpation.  Lungs: Clear to auscultation, no wheezing or rhonchi noted. No increased vocal fremitus resonant to percussion  Abdomen: Soft, nontender, nondistended, positive bowel sounds, no masses no hepatosplenomegaly noted..  Neuro: No focal neurological deficits noted cranial nerves II through XII grossly intact. DTRs 2+ bilaterally upper and lower extremities. Strength 5 out of 5 in bilateral upper and lower extremities. Musculoskeletal:   Upper/lower extremities tender to palpation. Primarily to left anterior thigh.  No warm swelling or erythema around joints, no spinal tenderness noted. Psychiatric: Patient alert and oriented x3. Tearful No suicidal or homicidal thoughts.  Lymph node survey: No cervical axillary or inguinal lymphadenopathy noted.  Lab Results:  Basic Metabolic Panel:    Component Value Date/Time   NA 139 10/20/2017 0619   NA 141 09/13/2017 1425   K 4.5 10/20/2017 0619   CL 102 10/20/2017 0619   CO2 24 10/20/2017 0619   BUN 7 10/20/2017 0619   BUN 6 09/13/2017 1425   CREATININE 0.52 (L) 10/20/2017 0619   GLUCOSE 93 10/20/2017 0619   CALCIUM 9.0 10/20/2017 0619   CBC:    Component Value Date/Time   WBC 11.9 (H) 10/20/2017 0856   HGB 7.4 (L) 10/20/2017 0856   HGB 10.6 (L) 09/13/2017 1425   HCT 21.6 (L) 10/20/2017 0856   HCT 32.7 (L) 09/13/2017 1425   PLT 434 (H) 10/20/2017 0856   PLT 526 (H) 09/13/2017 1425   MCV 86.1 10/20/2017 0856   MCV 93 09/13/2017 1425   NEUTROABS 9.5 10/20/2017 0856   NEUTROABS 4.6 09/13/2017 1425   LYMPHSABS 1.3 10/20/2017 0856   LYMPHSABS 2.1 09/13/2017 1425   MONOABS 1.1 10/20/2017 0856   EOSABS 0.0 10/20/2017 0856   EOSABS 0.1 09/13/2017 1425   BASOSABS 0.0 10/20/2017 0856   BASOSABS 0.0 09/13/2017 1425    No results found for this or any previous visit (from the past 240 hour(s)).  Studies/Results: No results found.  Medications: Scheduled Meds: . enoxaparin (LOVENOX) injection  40 mg Subcutaneous Q24H  . feeding supplement (ENSURE ENLIVE)  237 mL Oral TID BM  . folic acid  1 mg Oral Daily  . gabapentin  300 mg Oral TID  . HYDROmorphone   Intravenous Q4H  . hydroxyurea  500 mg Oral BID  . oxyCODONE  20 mg Oral Q12H  . potassium chloride SA  60 mEq Oral BID  . senna-docusate  1 tablet Oral BID  . valACYclovir  500 mg Oral Daily  . Vitamin D (Ergocalciferol)  50,000 Units Oral Q Wed   Continuous Infusions: . dextrose 5 % and 0.45 % NaCl with KCl  10 mEq/L 10 mL/hr at 10/23/17 0800  . diphenhydrAMINE     PRN Meds:.acetaminophen, diphenhydrAMINE **OR** diphenhydrAMINE, lip balm, MUSCLE RUB, naLOXone (NARCAN)  injection **AND** sodium chloride flush, naloxone **AND** sodium chloride flush, ondansetron (ZOFRAN) IV, oxyCODONE, polyethylene glycol, ramelteon  Consultants: Dietitian consult   Assessment/Plan: Principal Problem:   Adjustment disorder with disturbance of conduct Active Problems:   Sickle cell pain crisis (HCC)   Hypokalemia   Sickle cell anemia (HCC)   Sickle cell crisis (HCC)   Fever   Chronic pain syndrome   Recurrent cold sores   Agitation   Physical deconditioning  Sickle Cell Disease with crisis:  Continue [facility],  Monitor vitals very closely, Re-evaluate pain scale regularly, 2 L of Oxygen by [place], Patient will be re-evaluated for pain in the context of function and relationship to baseline as care progresses. Anemia of chronic disease: Stable post transfusion.  Patient was transfused 1 unit of packed red blood cells on 10/19/2017.    Chronic pain syndrome:  Will continue OxyContin 20 mg every 12 hours Will start oxycodone 10 mg every 4 hours and will wean custom dose PCA dilaudid.   Fever of unknown origin:  Patient has been on antibiotics. He remains afebrile.   Leukocytosis:  Current WBC count 17,000.  Patient has persistent low-grade temperature, will continue IV antibiotic therapy.  Hypokalemia:  Potassium repletion.  Cachexia: BMI 16. Nutritional consult. Will defer to RD recommendations.   Adjustment disorder:  Psychiatry consulted, no new medications. Patient tearful on assessment.    Physical deconditioning:  Patient unable to get out of bed or walk without max assistance. He has been using a walker to stand. Patient warrants PT consult.      Code Status: Full Code Family Communication: N/A Disposition Plan: Not yet ready for discharge  [place], MSN,  FNP-C Patient [place] [address] [place], [place] [address] [phone] If 7PM-7AM, please contact night-coverage.  10/23/2017, 10:06 AM  LOS: 8 days

## 2017-10-23 NOTE — Progress Notes (Signed)
CRITICAL VALUE ALERT  Critical Value: K+ = 6.6  Date & Time Notied:  10/23/17 at 1056  Provider Notified: [place], [place]  Orders Received/Actions taken: waiting for order

## 2017-10-23 NOTE — Evaluation (Signed)
Physical Therapy Evaluation Patient Details Name: Timothy Marks MRN: [id number] DOB: 1990/09/18 Today's Date: 10/23/2017   History of Present Illness  Pt is a 27 year old male admitted for management of sickle cell crisis with hx of L shoulder osteonecrosis  Clinical Impression  Pt admitted with above diagnosis. Pt currently with functional limitations due to the deficits listed below (see PT Problem List).  Pt will benefit from skilled PT to increase their independence and safety with mobility to allow discharge to the venue listed below.  Pt able to stand and transfer from bed to window seat with min/guard - supervision due to lines.  Pt reports pain in LEs limiting his mobility.  Pt requesting RW for use at home upon d/c.     Follow Up Recommendations No PT follow up    Equipment Recommendations  Rolling walker with 5" wheels    Recommendations for Other Services       Precautions / Restrictions Precautions Precautions: None Restrictions Weight Bearing Restrictions: No      Mobility  Bed Mobility Overal bed mobility: Needs Assistance Bed Mobility: Supine to Sit;Sit to Supine     Supine to sit: Supervision Sit to supine: Supervision   General bed mobility comments: supervision for lines only  Transfers Overall transfer level: Needs assistance Equipment used: Rolling walker (2 wheeled) Transfers: Sit to/from [facility] Sit to Stand: Min guard Stand pivot transfers: Min guard       General transfer comment: min/guard for safety, pt with slow movement due to pain however did not require any assist  Ambulation/Gait             General Gait Details: declined hallway ambulation, fixated on changing his bed (ice water spilled just prior to arrival) so pt stood up and moved to window seat (a few steps) with supervision for lines and using RW while therapist and NT changed his bed  Stairs            Wheelchair Mobility    Modified Rankin  (Stroke Patients Only)       Balance                                             Pertinent Vitals/Pain Pain Assessment: 0-10 Pain Score: 10-Worst pain ever Pain Location: LEs with standing Pain Descriptors / Indicators: (did not describe) Pain Intervention(s): Limited activity within patient's tolerance;Repositioned;Monitored during session;PCA encouraged    Home Living Family/patient expects to be discharged to:: Private residence Living Arrangements: Parent     Home Access: Stairs to enter Entrance Stairs-Rails: Right Entrance Stairs-Number of Steps: 4-5 Home Layout: One level Home Equipment: None      Prior Function Level of Independence: Independent               Hand Dominance        Extremity/Trunk Assessment        Lower Extremity Assessment Lower Extremity Assessment: Generalized weakness(diffuse muscle atrophy throughout, cachexia)       Communication   Communication: No difficulties  Cognition Arousal/Alertness: Awake/alert Behavior During Therapy: Flat affect Overall Cognitive Status: No family/caregiver present to determine baseline cognitive functioning                                 General Comments: grogginess likely from pain  meds, pt very particular about placement of ojects in his room      General Comments      Exercises     Assessment/Plan    PT Assessment Patient needs continued PT services  PT Problem List Decreased strength;Decreased mobility;Decreased balance;Decreased knowledge of use of DME       PT Treatment Interventions DME instruction;Therapeutic activities;Gait training;Therapeutic exercise;Stair training;Patient/family education;Functional mobility training    PT Goals (Current goals can be found in the Care Plan section)  Acute Rehab PT Goals PT Goal Formulation: With patient Time For Goal Achievement: 11/06/17 Potential to Achieve Goals: Good    Frequency Min 3X/week    Barriers to discharge        Co-evaluation               AM-PAC PT "6 Clicks" Daily Activity  Outcome Measure Difficulty turning over in bed (including adjusting bedclothes, sheets and blankets)?: A Little Difficulty moving from lying on back to sitting on the side of the bed? : A Little Difficulty sitting down on and standing up from a chair with arms (e.g., wheelchair, bedside commode, etc,.)?: A Little Help needed moving to and from a bed to chair (including a wheelchair)?: A Little Help needed walking in hospital room?: A Little Help needed climbing 3-5 steps with a railing? : A Little 6 Click Score: 18    End of Session Equipment Utilized During Treatment: Other (comment) Activity Tolerance: Patient tolerated treatment well Patient left: in bed;with call bell/phone within reach Nurse Communication: Mobility status PT Visit Diagnosis: Other abnormalities of gait and mobility (R26.89)    Time: 1427-1450 PT Time Calculation (min) (ACUTE ONLY): 23 min   Charges:   PT Evaluation $PT Eval Low Complexity: 1 Low        Carmelia Bake, PT, DPT 10/23/2017 Pager: [phone]  York Ram E 10/23/2017, 4:17 PM

## 2017-10-23 NOTE — Care Management (Addendum)
  Assessment/Plan: Principal Problem:   Adjustment disorder with disturbance of conduct Active Problems:   Sickle cell pain crisis ([place]) Goal Length of Stay: Ambulatory or 2 days  Note: Goal Length of Stay assumes optimal recovery, decision making, and care. Patients may be discharged to a lower level of care (either later than or sooner than the goal) when it is appropriate for their clinical status and care needs. Discharge Readiness Return to top of Sickle Cell Disease RRG - [place]  Discharge readiness is indicated by patient meeting Recovery Milestones, including ALL of the following: ? Hemodynamic stability yes ? Tachypnea absent yes ? Hypoxemia absent presently on o2 via [place] ? Acute peripheral ischemia absent yes ? Infection absent or adequately treated yes ? Mental status at baseline depression being evaulated ? New neurologic deficits absent-yes ? Pain absent or managed no iv dilaudid pca pump/ ? Ambulatory ? Oral hydration, medications, and diet ? Regular diet, iv flds.   Concurrent Review - Discharge Readiness Milestones, Not Met

## 2017-10-24 ENCOUNTER — Telehealth: Payer: Self-pay

## 2017-10-24 LAB — BASIC METABOLIC PANEL
Anion gap: 11 (ref 5–15)
BUN: 22 mg/dL — ABNORMAL HIGH (ref 6–20)
CO2: 29 mmol/L (ref 22–32)
Calcium: 10.3 mg/dL (ref 8.9–10.3)
Chloride: 94 mmol/L — ABNORMAL LOW (ref 98–111)
Creatinine, Ser: 0.8 mg/dL (ref 0.61–1.24)
GFR calc Af Amer: >60 mL/min (ref >60)
GFR calc non Af Amer: >60 mL/min (ref >60)
Glucose, Bld: 119 mg/dL — ABNORMAL HIGH (ref 70–99)
Potassium: 5.6 mmol/L — ABNORMAL HIGH (ref 3.5–5.1)
Sodium: 134 mmol/L — ABNORMAL LOW (ref 135–145)

## 2017-10-24 MED ORDER — HYDROMORPHONE 1 MG/ML IV SOLN
INTRAVENOUS | Status: DC
  Administered 2017-10-24: 3.2 mg via INTRAVENOUS
  Administered 2017-10-24: 4.5 mg via INTRAVENOUS
  Administered 2017-10-25: 0.8 mg via INTRAVENOUS
  Administered 2017-10-25: 1.5 mg via INTRAVENOUS
  Administered 2017-10-25: 1 mg via INTRAVENOUS

## 2017-10-24 MED ORDER — ACETAMINOPHEN 325 MG PO TABS: 650.0000 mg | ORAL_TABLET | Freq: Four times a day (QID) | ORAL | Status: DC | PRN

## 2017-10-24 NOTE — Progress Notes (Signed)
Subjective: Timothy Marks, a 27 year old male with a history of sickle cell anemia and chronic pain syndrome was admitted in sickle cell crisis. Patient says that pain intensity is 5/10. Patient says that pain is primarily to lower extremities. He tolerate physical therapy consult on 9/9.  Patient continues to utilized walker to assist with standing and ambulation.  Patient has remained afebrile overnight. Patient denies headache, chest pain, shortness of breath, nausea, vomiting, diarrhea, or constipation.   Objective:  Vital signs in last 24 hours:  Vitals:   10/24/17 0400 10/24/17 0530 10/24/17 0532 10/24/17 0605  BP:   104/83   Pulse:   (!) 101   Resp: 16 16 16 16   Temp:   98.2 F (36.8 C)   TempSrc:   Oral   SpO2: 99% 99% 100% 99%  Weight:      Height:        Intake/Output from previous day:   Intake/Output Summary (Last 24 hours) at 10/24/2017 1058 Last data filed at 10/24/2017 0900 Gross per 24 hour  Intake 218.24 ml  Output 1600 ml  Net -1381.76 ml    Physical Exam: General: Alert, awake, oriented x3. Patient appears to be distressed. Patient thin, cachexia HEENT: [place]/AT PEERL, EOMI.  Neck: Trachea midline,  no masses, no thyromegal,y no JVD, no carotid bruit Heart: Regular rate and rhythm, without murmurs, rubs, gallops, PMI non-displaced, no heaves or thrills on palpation.  Lungs: Clear to auscultation, no wheezing or rhonchi noted. No increased vocal fremitus resonant to percussion  Abdomen: Soft, nontender, nondistended, positive bowel sounds, no masses no hepatosplenomegaly noted..  Neuro: No focal neurological deficits noted cranial nerves II through XII grossly intact. DTRs 2+ bilaterally upper and lower extremities. Strength 5 out of 5 in bilateral upper and lower extremities. Musculoskeletal:    No warm swelling or erythema around joints, no spinal tenderness noted. Psychiatric: Patient alert and oriented. Irritable.  Lymph node survey: No cervical axillary or  inguinal lymphadenopathy noted.  Lab Results:  Basic Metabolic Panel:    Component Value Date/Time   NA 134 (L) 10/24/2017 0541   NA 141 09/13/2017 1425   K 5.6 (H) 10/24/2017 0541   CL 94 (L) 10/24/2017 0541   CO2 29 10/24/2017 0541   BUN 22 (H) 10/24/2017 0541   BUN 6 09/13/2017 1425   CREATININE 0.80 10/24/2017 0541   GLUCOSE 119 (H) 10/24/2017 0541   CALCIUM 10.3 10/24/2017 0541   CBC:    Component Value Date/Time   WBC 13.7 (H) 10/23/2017 1014   HGB 8.2 (L) 10/23/2017 1014   HGB 10.6 (L) 09/13/2017 1425   HCT 24.6 (L) 10/23/2017 1014   HCT 32.7 (L) 09/13/2017 1425   PLT 812 (H) 10/23/2017 1014   PLT 526 (H) 09/13/2017 1425   MCV 84.8 10/23/2017 1014   MCV 93 09/13/2017 1425   NEUTROABS 8.6 (H) 10/23/2017 1014   NEUTROABS 4.6 09/13/2017 1425   LYMPHSABS 3.2 10/23/2017 1014   LYMPHSABS 2.1 09/13/2017 1425   MONOABS 1.4 (H) 10/23/2017 1014   EOSABS 0.5 10/23/2017 1014   EOSABS 0.1 09/13/2017 1425   BASOSABS 0.0 10/23/2017 1014   BASOSABS 0.0 09/13/2017 1425    No results found for this or any previous visit (from the past 240 hour(s)).  Studies/Results: No results found.  Medications: Scheduled Meds: . enoxaparin (LOVENOX) injection  40 mg Subcutaneous Q24H  . feeding supplement (ENSURE ENLIVE)  237 mL Oral TID BM  . folic acid  1 mg Oral Daily  .  gabapentin  300 mg Oral TID  . HYDROmorphone   Intravenous Q4H  . hydroxyurea  500 mg Oral BID  . oxyCODONE  20 mg Oral Q12H  . senna-docusate  1 tablet Oral BID  . valACYclovir  500 mg Oral Daily  . Vitamin D (Ergocalciferol)  50,000 Units Oral Q Wed   Continuous Infusions: . sodium chloride Stopped (10/23/17 1304)  . dextrose 5 % and 0.45% NaCl 10 mL/hr at 10/23/17 1621  . diphenhydrAMINE     PRN Meds:.sodium chloride, acetaminophen, diphenhydrAMINE **OR** diphenhydrAMINE, lip balm, MUSCLE RUB, naLOXone (NARCAN)  injection **AND** sodium chloride flush, naloxone **AND** sodium chloride flush, ondansetron  (ZOFRAN) IV, oxyCODONE, polyethylene glycol, ramelteon  Consultants: Dietitian consult   Assessment/Plan: Principal Problem:   Adjustment disorder with disturbance of conduct Active Problems:   Sickle cell pain crisis (HCC)   Hypokalemia   Sickle cell anemia (HCC)   Sickle cell crisis (HCC)   Fever   Chronic pain syndrome   Recurrent cold sores   Agitation   Physical deconditioning  Sickle Cell Disease with crisis:  Continue [facility] at [facility],  Monitor vitals very closely, Re-evaluate pain scale regularly, 2 L of Oxygen by [place], Patient will be re-evaluated for pain in the context of function and relationship to baseline as care progresses. Continue to wean dilaudid PCA, reduced to 1.5 mg/hr Anemia of chronic disease: Stable post transfusion.  Patient was transfused 1 unit of packed red blood cells on 10/19/2017.    Chronic pain syndrome:  Will continue OxyContin 20 mg every 12 hours Will continue oxycodone 10 mg every 4 hours and will wean custom dose PCA dilaudid.   Fever of unknown origin:  Patient has been on antibiotics. He remains afebrile.   Leukocytosis:  Stable, patient remains afebrile.     Hypokalemia:  Potassium repletion completed. Discontinued potassium depletion due to potassium of 6.6 on yesterday. Repeated BMP, decreased to 5.6. Will continue to monitor.   Cachexia: BMI 16. Nutritional consult. Will defer to RD recommendations.   Adjustment disorder:  Psychiatry consulted, no new medications. Patient tearful on assessment.    Physical deconditioning:  PT evaluation completed. Patient continues to walk with assistance. Recommend that patient ambulates in halls as tolerated.      Code Status: Full Code Family Communication: N/A Disposition Plan: Not yet ready for discharge  [place], MSN, FNP-C Patient [place] [address] [place], [place] [address] [phone] If 7PM-7AM, please contact  night-coverage.  10/24/2017, 10:58 AM  LOS: 9 days

## 2017-10-25 DIAGNOSIS — D75839 Thrombocytosis, unspecified: Secondary | ICD-10-CM

## 2017-10-25 DIAGNOSIS — D473 Essential (hemorrhagic) thrombocythemia: Secondary | ICD-10-CM

## 2017-10-25 LAB — CBC WITH DIFFERENTIAL/PLATELET
Basophils Absolute: 0 10*3/uL (ref 0.0–0.1)
Basophils Relative: 0 %
Eosinophils Absolute: 0.1 10*3/uL (ref 0.0–0.7)
Eosinophils Relative: 1 %
HCT: 21.7 % — ABNORMAL LOW (ref 39.0–52.0)
Hemoglobin: 7 g/dL — ABNORMAL LOW (ref 13.0–17.0)
Lymphocytes Relative: 31 %
Lymphs Abs: 3.3 10*3/uL (ref 0.7–4.0)
MCH: 27.6 pg (ref 26.0–34.0)
MCHC: 32.3 g/dL (ref 30.0–36.0)
MCV: 85.4 fL (ref 78.0–100.0)
Monocytes Absolute: 1.7 10*3/uL — ABNORMAL HIGH (ref 0.1–1.0)
Monocytes Relative: 16 %
Neutro Abs: 5.5 10*3/uL (ref 1.7–7.7)
Neutrophils Relative %: 52 %
Platelets: 1147 10*3/uL (ref 150–400)
RBC: 2.54 MIL/uL — ABNORMAL LOW (ref 4.22–5.81)
RDW: 19.7 % — ABNORMAL HIGH (ref 11.5–15.5)
WBC: 10.6 10*3/uL — ABNORMAL HIGH (ref 4.0–10.5)
nRBC: 7 /100 WBC — ABNORMAL HIGH

## 2017-10-25 LAB — COMPREHENSIVE METABOLIC PANEL
ALT: 20 U/L (ref 0–44)
AST: 27 U/L (ref 15–41)
Albumin: 4 g/dL (ref 3.5–5.0)
Alkaline Phosphatase: 78 U/L (ref 38–126)
Anion gap: 11 (ref 5–15)
BUN: 22 mg/dL — ABNORMAL HIGH (ref 6–20)
CO2: 30 mmol/L (ref 22–32)
Calcium: 10.1 mg/dL (ref 8.9–10.3)
Chloride: 96 mmol/L — ABNORMAL LOW (ref 98–111)
Creatinine, Ser: 0.72 mg/dL (ref 0.61–1.24)
GFR calc Af Amer: >60 mL/min (ref >60)
GFR calc non Af Amer: >60 mL/min (ref >60)
Glucose, Bld: 98 mg/dL (ref 70–99)
Potassium: 4.5 mmol/L (ref 3.5–5.1)
Sodium: 137 mmol/L (ref 135–145)
Total Bilirubin: 1.6 mg/dL — ABNORMAL HIGH (ref 0.3–1.2)
Total Protein: 9 g/dL — ABNORMAL HIGH (ref 6.5–8.1)

## 2017-10-25 MED ORDER — IBUPROFEN 800 MG PO TABS: 800.0000 mg | ORAL_TABLET | Freq: Three times a day (TID) | ORAL | 0 refills | Status: DC | PRN

## 2017-10-25 MED ORDER — OXYCODONE HCL ER 20 MG PO T12A: 20.0000 mg | EXTENDED_RELEASE_TABLET | Freq: Two times a day (BID) | ORAL | 0 refills | Status: DC

## 2017-10-25 MED ORDER — OXYCODONE HCL 10 MG PO TABS: 10.0000 mg | ORAL_TABLET | ORAL | 0 refills | Status: DC | PRN

## 2017-10-25 MED ORDER — ASPIRIN 81 MG PO CHEW
81.0000 mg | CHEWABLE_TABLET | Freq: Once | ORAL | Status: AC
  Administered 2017-10-25: 81 mg via ORAL
  Filled 2017-10-25: qty 1

## 2017-10-25 NOTE — Progress Notes (Signed)
Nutrition Follow-up  DOCUMENTATION CODES:   Underweight, Severe malnutrition in context of chronic illness  INTERVENTION:   Continue Ensure Enlive po TID, each supplement provides 350 kcal and 20 grams of protein  NUTRITION DIAGNOSIS:   Severe Malnutrition related to chronic illness(SCA) as evidenced by percent weight loss, moderate fat depletion, moderate muscle depletion, severe muscle depletion.  Ongoing.  GOAL:   Patient will meet greater than or equal to 90% of their needs  Progressing.  MONITOR:   PO intake, Supplement acceptance, Weight trends, Labs  ASSESSMENT:   Patient with PMH significant for sickle cell anemia, VTE, and depression.  Presents to [facility] with sickle cell pain crisis and hypokalemia. Transferred to [facility] for further management.   Patient eating 75-100% of meals on 9/10. This morning did not order breakfast. Pt is drinking Ensure supplements TID (1050 kcal, 60g protein). No new weights have been measured for this admission.   Labs reviewed. Medications: folic acid tablet daily, Senokot-S tablet BID, Vitamin D capsule weekly  Diet Order:   Diet Order            Diet regular Room service appropriate? Yes; Fluid consistency: Thin  Diet effective now              EDUCATION NEEDS:   Education needs have been addressed  Skin:  Skin Assessment: Reviewed RN Assessment  Last BM:  10/15/17  Height:   Ht Readings from Last 1 Encounters:  10/15/17 6\' 2"  (1.88 m)    Weight:   Wt Readings from Last 1 Encounters:  10/15/17 56.7 kg    Ideal Body Weight:  86.4 kg  BMI:  Body mass index is 16.05 kg/m.  Estimated Nutritional Needs:   Kcal:  1850-2050 kcal  Protein:  90-105 grams   Fluid:  >/= 1.8 L/day  Clayton Bibles, MS, RD, LDN [facility] Dietitian Pager: [phone] After Hours Pager: [phone]

## 2017-10-25 NOTE — Progress Notes (Signed)
Patient given discharge instructions, but refused to stay and listen to discharge instructions.  Patient agrees with discharge plan, and is being discharged in stable medical condition.  Patient given transportation via wheelchair.  Durwin Nora RN

## 2017-10-25 NOTE — Progress Notes (Signed)
Dilaudid PCA syringe empty prior to discharge so nothing was wasted.

## 2017-10-25 NOTE — Discharge Instructions (Signed)
Deconditioning °Deconditioning refers to the changes in your body that occur during a period of inactivity. The changes happen in your heart, lungs, and muscles. They decrease your ability to be active, and they make you feel tired and weak. °There are three stages of deconditioning: °· Mild deconditioning. At this stage, you will notice a change in your ability to do your usual exercise activities, such as running, biking, or swimming. °· Moderate deconditioning. At this stage, you will notice a change in your ability to do normal everyday activities, such as walking, grocery shopping, and doing chores. °· Severe deconditioning. At this stage, you will notice a change in your ability to do minimal activity or normal self-care. ° °Deconditioning can occur after only a few days of inactivity. The longer the period of inactivity, the more severe the deconditioning will be, and the longer it will take to return to your previous level of functioning. °What are the causes? °Deconditioning is often caused by inactivity due to: °· Illnesses, such as cancer, stroke, heart attack, fibromyalgia, and chronic fatigue syndrome. °· Injuries, especially back injuries, broken bones, and ligament and tendon injuries. °· A long stay in the hospital. °· Pregnancy, especially if long periods of bed rest are needed. ° °What increases the risk? °This condition is more likely to develop in: °· People who are hospitalized. °· People on bed rest. °· People who are obese. °· People with poor nutrition. °· Elderly adults. °· People with injuries or illnesses that interfere with movement and activity. ° °What are the signs or symptoms? °Symptoms of deconditioning include: °· Weakness. °· Tiredness. °· Shortness of breath with minor exertion. °· A faster-than-normal heartbeat. You may not notice this without taking your pulse. °· Pain or discomfort with activity. °· Decreased strength. °· Decreased sense of balance. °· Decreased  endurance. °· Difficulty doing your usual forms of exercise. °· Difficulty doing activities of daily living, such as grocery shopping or chores. °· Difficulty walking around the house and doing basic self-care, such as getting to the bathroom, preparing meals, or doing laundry. ° °How is this diagnosed? °Deconditioning is diagnosed based on your medical history and a physical exam. During the physical exam, your health care provider will check for signs of deconditioning, such as: °· Decreased size of muscles. °· Decreased strength. °· Trouble with balance. °· Shortness of breath or abnormally increased heart rate after minor exertion. ° °How is this treated? °Treatment for deconditioning usually involves following a structured exercise program in which activity is increased gradually. Your health care provider will determine which exercises are right for you. The exercise program will likely include aerobic exercise and strength training: °· Aerobic exercise helps improve the functioning of the heart and lungs as well as the muscles. °· Strength training helps improve muscle size and strength. ° °Both of these types of exercise will improve your endurance. You may be referred to a physical therapist who can create a safe strengthening program for you to follow. °Follow these instructions at home: °· Follow the exercise program that is recommended by your health care provider or physical therapist. °· Do not increase your exercise any faster than directed. °· Eat a healthy diet. °· Do not use any products that contain nicotine or tobacco, such as cigarettes and e-cigarettes. If you need help quitting, ask your health care provider. °· Take over-the-counter and prescription medicines only as told by your health care provider. °· Keep all follow-up visits as told by your health care   provider. This is important. Contact a health care provider if:  You are not able to carry out the prescribed exercise program.  You  are becoming more and more fatigued and weak.  You become light-headed when rising to a sitting or standing position.  Your level of endurance decreases after it has improved. Get help right away if:  You have chest pain.  You are very short of breath.  You have any episodes of passing out. This information is not intended to replace advice given to you by your health care provider. Make sure you discuss any questions you have with your health care provider. Document Released: 06/17/2013 Document Revised: 08/21/2015 Document Reviewed: 05/02/2015 Elsevier Interactive Patient Education  2018 [employer]. Chronic Pain, Adult Chronic pain is a type of pain that lasts or keeps coming back (recurs) for at least six months. You may have chronic headaches, abdominal pain, or body pain. Chronic pain may be related to an illness, such as fibromyalgia or complex regional pain syndrome. Sometimes the cause of chronic pain is not known. Chronic pain can make it hard for you to do daily activities. If not treated, chronic pain can lead to other health problems, including anxiety and depression. Treatment depends on the cause and severity of your pain. You may need to work with a pain specialist to come up with a treatment plan. The plan may include medicine, counseling, and physical therapy. Many people benefit from a combination of two or more types of treatment to control their pain. Follow these instructions at home: Lifestyle  Consider keeping a pain diary to share with your health care providers.  Consider talking with a mental health care provider (psychologist) about how to cope with chronic pain.  Consider joining a chronic pain support group.  Try to control or lower your stress levels. Talk to your health care provider about strategies to do this. General instructions   Take over-the-counter and prescription medicines only as told by your health care provider.  Follow your treatment  plan as told by your health care provider. This may include: ? Gentle, regular exercise. ? Eating a healthy diet that includes foods such as vegetables, fruits, fish, and lean meats. ? Cognitive or behavioral therapy. ? Working with a Community education officer. ? Meditation or yoga. ? Acupuncture or massage therapy. ? Aroma, color, light, or sound therapy. ? Local electrical stimulation. ? Shots (injections) of numbing or pain-relieving medicines into the spine or the area of pain.  Check your pain level as told by your health care provider. Ask your health care provider if you should use a pain scale.  Learn as much as you can about how to manage your chronic pain. Ask your health care provider if an intensive pain rehabilitation program or a chronic pain specialist would be helpful.  Keep all follow-up visits as told by your health care provider. This is important. Contact a health care provider if:  Your pain gets worse.  You have new pain.  You have trouble sleeping.  You have trouble doing your normal activities.  Your pain is not controlled with treatment.  Your have side effects from pain medicine.  You feel weak. Get help right away if:  You lose feeling or have numbness in your body.  You lose control of bowel or bladder function.  Your pain suddenly gets much worse.  You develop shaking or chills.  You develop confusion.  You develop chest pain.  You have trouble breathing or shortness  of breath.  You pass out.  You have thoughts about hurting yourself or others. This information is not intended to replace advice given to you by your health care provider. Make sure you discuss any questions you have with your health care provider. Document Released: 10/23/2001 Document Revised: 10/01/2015 Document Reviewed: 07/21/2015 Elsevier Interactive Patient Education  2018 [employer]. Sickle Cell Anemia, Adult Sickle cell anemia is a condition where your red blood  cells are shaped like sickles. Red blood cells carry oxygen through the body. Sickle-shaped red blood cells do not live as long as normal red blood cells. They also clump together and block blood from flowing through the blood vessels. These things prevent the body from getting enough oxygen. Sickle cell anemia causes organ damage and pain. It also increases the risk of infection. Follow these instructions at home:  Drink enough fluid to keep your pee (urine) clear or pale yellow. Drink more in hot weather and during exercise.  Do not smoke. Smoking lowers oxygen levels in the blood.  Only take over-the-counter or prescription medicines as told by your doctor.  Take antibiotic medicines as told by your doctor. Make sure you finish them even if you start to feel better.  Take supplements as told by your doctor.  Consider wearing a medical alert bracelet. This tells anyone caring for you in an emergency of your condition.  When traveling, keep your medical information, doctors' names, and the medicines you take with you at all times.  If you have a fever, do not take fever medicines right away. This could cover up a problem. Tell your doctor.  Keep all follow-up visits with your doctor. Sickle cell anemia requires regular medical care. Contact a doctor if: You have a fever. Get help right away if:  You feel dizzy or faint.  You have new belly (abdominal) pain, especially on the left side near the stomach area.  You have a lasting, often uncomfortable and painful erection of the penis (priapism). If it is not treated right away, you will become unable to have sex (impotence).  You have numbness in your arms or legs or you have a hard time moving them.  You have a hard time talking.  You have a fever or lasting symptoms for more than 2-3 days.  You have a fever and your symptoms suddenly get worse.  You have signs or symptoms of infection. These include: ? Chills. ? Being more  tired than normal (lethargy). ? Irritability. ? Poor eating. ? Throwing up (vomiting).  You have pain that is not helped with medicine.  You have shortness of breath.  You have pain in your chest.  You are coughing up pus-like or bloody mucus.  You have a stiff neck.  Your feet or hands swell or have pain.  Your belly looks bloated.  Your joints hurt. This information is not intended to replace advice given to you by your health care provider. Make sure you discuss any questions you have with your health care provider. Document Released: 11/21/2012 Document Revised: 07/09/2015 Document Reviewed: 09/12/2012 Elsevier Interactive Patient Education  2017 [employer].

## 2017-10-25 NOTE — Discharge Summary (Signed)
Physician Discharge Summary  Darrow Barreiro RKY:[id number] DOB: 02/28/1990 DOA: 10/15/2017  PCP: Lanae Boast, FNP  Admit date: 10/15/2017  Discharge date: 10/25/2017  Discharge Diagnoses:  Principal Problem:   Adjustment disorder with disturbance of conduct Active Problems:   Sickle cell pain crisis (HCC)   Hypokalemia   Sickle cell anemia (HCC)   Sickle cell crisis (HCC)   Fever   Chronic pain syndrome   Recurrent cold sores   Agitation   Physical deconditioning   Discharge Condition: Stable  Disposition:  Follow-up Information    Lanae Boast, FNP Follow up in 1 week(s).   Specialty:  Family Medicine Why:  Follow up in office in 1 week to repeat labs (CBC, CMP) and for medication mangement.  Contact information: [address] [place] [address] [phone]          Pt is discharged home in good condition and is to follow up with Lanae Boast, FNP in 1 week to have labs evaluated. Viaan Knippenberg is instructed to increase activity slowly and balance with rest for the next few days, and use prescribed medication to complete treatment of pain  Diet: Regular Wt Readings from Last 3 Encounters:  10/15/17 56.7 kg  09/13/17 54 kg  08/04/17 53.1 kg    History of present illness:  Jarrick Fjeld  is a 27 y.o. male with a medical history significant for sickle cell anemia and chronic pain syndrome admitted to inpatient services for sickle cell crisis. Patient was initially admitted to inpatient services on 10/16/2017 after being unresponsive to home percocet. Also, patient was recently discharged from [facility] for sickle cell crisis. Patient says that prior to admission, he had a sudden onset of significant pain and tenderness to joints, neck, and back. He attributes current pain crisis to changes in weather.  Patient transferred from inpatient services at [facility] to [place] long for extended care involving current sickle cell pain crisis.  Patient  stated that pain has not been controlled since admission.   Patient was receiving IV Dilaudid 2 mg hourly via clinician assisted dose without relief.  Today, attempted to transition to oral pain medication regimen.  Patient states that pain had not been controlled on IV pain medications and he felt that was not appropriate to start oral medication at this time. Patient has struggled with PCA and safety parameters. He says that he had sores in his nose and was unable to wear CO2 monitor consistently. Admitted to [facility] for sickle cell pain crisis.   Hospital Course:  Sickle cell anemia:   Patient's pain was difficult to manage throughout admission.  I suspect some hyperalgesia.  Patient was managed appropriately with IVF, IV Dilaudid via PCA, and IV Toradol.  PCA Dilaudid was weaned appropriately throughout admission.  Patient was admitted for sickle cell pain crisis and managed appropriately with IVF, IV Dilaudid via PCA and IV Toradol, as well as other adjunct therapies per sickle cell pain management protocols.  Chronic pain syndrome: Patient has a long history of chronic pain syndrome.  As of late, he has mostly been frequenting emergency departments for pain control.  Patient has not received a prescription for opiate pain medication from primary provider since 10/03/2017.  Pain was previously controlled on Percocet 10-3 25. During admission, pain was difficult to control.  Patient was frequently agitated and an underlying mood disorder was suspected.  Patient was evaluated by psychiatry, who determine adjustment disorder related to chronic disease. Pain is been  controlled on OxyContin 20 mg every 12 hours and patient was transitioned to oxycodone 10 mg every 4 hours. Patient had a difficult time understanding pain scale.  Pain scale had to be explained in terms of small, medium, and large pain.  On discharge, patient states that he has small pain. Patient can manage at home with oral pain  medications. Prescriptions for pain medications have been sent to pharmacy.  Patient has follow-up appointment scheduled in 1 week. When explaining discharge, patient became and was focused on medication co-pays.  He was given information for [facility] sickle cell agency to assist with co-pays.  Recommend CBC with differential and CMP  Physical deconditioning: Patient underwent PT evaluation, determined that patient is able to stand and ambulate with minimal assistance.  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  Will continue aspirin 81 mg daily. Cachexia: Patient's BMI is 16.  Nutrition evaluation to determine patient dietary needs.  Hypokalemia: Potassium returned to baseline following repletion Recommend repeat CMP at follow-up appointment  Patient was discharged home today in a hemodynamically stable condition.   Discharge Exam: Vitals:   10/25/17 1310 10/25/17 1539  BP: (!) 106/54   Pulse: 87   Resp: 14 14  Temp: 98.7 F (37.1 C)   SpO2: 100% 100%   Vitals:   10/25/17 0723 10/25/17 1159 10/25/17 1310 10/25/17 1539  BP:   (!) 106/54   Pulse:   87   Resp: 14 14 14 14   Temp:   98.7 F (37.1 C)   TempSrc:      SpO2: 100% 98% 100% 100%  Weight:      Height:       Physical Exam  Constitutional: He is oriented to person, place, and time. He appears well-developed. No distress.  HENT:  Head: Normocephalic.  Eyes: Pupils are equal, round, and reactive to light.  Neck: Normal range of motion. Neck supple.  Cardiovascular: Normal rate and regular rhythm.  Pulmonary/Chest: Effort normal and breath sounds normal. No stridor. No respiratory distress. He exhibits no tenderness.  Abdominal: Soft. Bowel sounds are normal.  Musculoskeletal: Normal range of motion.  Neurological: He is alert and oriented to person, place, and time.  Skin: Skin is warm and dry.  Psychiatric: He is agitated. He is inattentive.    Discharge  Instructions  Discharge Instructions    Discharge patient   Complete by:  As directed    Discharge disposition:  01-Home or Self Care   Discharge patient date:  10/25/2017     Allergies as of 10/25/2017      Reactions   Buprenorphine Hcl Hives   Levaquin [levofloxacin] Itching   Meperidine Rash   Morphine And Related Hives   Toradol [ketorolac Tromethamine] Itching   Tramadol Hives   Vancomycin Itching   Zosyn [piperacillin Sod-tazobactam So] Itching, Rash   Has taken rocephin in past      Medication List    STOP taking these medications   oxyCODONE-acetaminophen 10-325 MG tablet Commonly known as:  PERCOCET   potassium chloride SA 20 MEQ tablet Commonly known as:  K-DUR,KLOR-CON   Vitamin D (Ergocalciferol) 50000 units Caps capsule Commonly known as:  DRISDOL     TAKE these medications   aspirin EC 81 MG tablet Take 1 tablet (81 mg total) by mouth daily.   cholecalciferol 1000 units tablet Commonly known as:  VITAMIN D Take 2 tablets (2,000 Units total) by mouth daily.   folic acid 1 MG tablet Commonly known as:  FOLVITE Take 1 tablet (1 mg total) by mouth daily.   hydroxyurea 500 MG capsule Commonly known as:  HYDREA Take 1 capsule (500 mg total) by mouth 2 (two) times daily. May take with food to minimize GI side effects.   ibuprofen 800 MG tablet Commonly known as:  ADVIL,MOTRIN Take 1 tablet (800 mg total) by mouth 3 (three) times daily as needed (pain).   Oxycodone HCl 10 MG Tabs Take 1 tablet (10 mg total) by mouth every 4 (four) hours as needed for moderate pain or severe pain.   oxyCODONE 20 mg 12 hr tablet Commonly known as:  OXYCONTIN Take 1 tablet (20 mg total) by mouth every 12 (twelve) hours.       The results of significant diagnostics from this hospitalization (including imaging, microbiology, ancillary and laboratory) are listed below for reference.    Significant Diagnostic Studies: Dg Chest 1 View  Result Date: 10/17/2017 CLINICAL  DATA:  Sickle cell disease with fever EXAM: CHEST  1 VIEW COMPARISON:  July 23, 2017 FINDINGS: There is no appreciable edema or consolidation. Heart is borderline prominent with pulmonary vascularity normal. No adenopathy. Bony sclerosis is consistent with known sickle cell disease. IMPRESSION: No edema or consolidation. Heart borderline prominent. No evident adenopathy. Bony changes consistent with sickle cell disease. Electronically Signed   By: Lowella Grip III M.D.   On: 10/17/2017 14:20    Microbiology: No results found for this or any previous visit (from the past 240 hour(s)).   Labs: Basic Metabolic Panel: Recent Labs  Lab 10/19/17 0339 10/20/17 0619 10/23/17 1014 10/24/17 0541 10/25/17 0627  NA 136 139 136 134* 137  K 3.7 4.5 6.6* 5.6* 4.5  CL 102 102 95* 94* 96*  CO2 26 24 30 29 30   GLUCOSE 107* 93 107* 119* 98  BUN <5* 7 18 22* 22*  CREATININE 0.51* 0.52* 0.69 0.80 0.72  CALCIUM 8.5* 9.0 10.5* 10.3 10.1   Liver Function Tests: Recent Labs  Lab 10/20/17 0619 10/23/17 1014 10/25/17 0627  AST 36 33 27  ALT 26 30 20   ALKPHOS 72 93 78  BILITOT 2.0* 1.8* 1.6*  PROT 7.1 9.4* 9.0*  ALBUMIN 3.4* 4.3 4.0   No results for input(s): LIPASE, AMYLASE in the last 168 hours. No results for input(s): AMMONIA in the last 168 hours. CBC: Recent Labs  Lab 10/20/17 0856 10/23/17 1014 10/25/17 0627  WBC 11.9* 13.7* 10.6*  NEUTROABS 9.5 8.6* 5.5  HGB 7.4* 8.2* 7.0*  HCT 21.6* 24.6* 21.7*  MCV 86.1 84.8 85.4  PLT 434* 812* 1,147*   Cardiac Enzymes: No results for input(s): CKTOTAL, CKMB, CKMBINDEX, TROPONINI in the last 168 hours. BNP: Invalid input(s): POCBNP CBG: No results for input(s): GLUCAP in the last 168 hours.  Time coordinating discharge: 50 minutes  Signed:  [name]  APRN, MSN, FNP-C [facility] [address] [place],  [address] [phone]  [facility] 10/25/2017, 4:03  PM

## 2017-10-25 NOTE — Progress Notes (Signed)
PT Cancellation Note  Patient Details Name: Timothy Marks MRN: [id number] DOB: February 19, 1990   Cancelled Treatment:    Reason Eval/Treat Not Completed: Attempted PT tx session-pt declined to participate despite encouragment from PT. Will check back another day.    Weston Anna, PT Acute Rehabilitation Services Pager: [phone] Office: [phone]

## 2017-10-26 LAB — PATHOLOGIST SMEAR REVIEW

## 2017-11-02 ENCOUNTER — Encounter: Payer: Self-pay | Admitting: Family Medicine

## 2017-11-02 ENCOUNTER — Ambulatory Visit (INDEPENDENT_AMBULATORY_CARE_PROVIDER_SITE_OTHER): Payer: Medicaid Other | Admitting: Family Medicine

## 2017-11-02 VITALS — BP 131/74 | HR 81 | Temp 99.3°F | Resp 14 | Ht 74.0 in | Wt 124.0 lb

## 2017-11-02 DIAGNOSIS — D571 Sickle-cell disease without crisis: Secondary | ICD-10-CM

## 2017-11-02 DIAGNOSIS — Z79891 Long term (current) use of opiate analgesic: Secondary | ICD-10-CM

## 2017-11-02 MED ORDER — OXYCODONE HCL 10 MG PO TABS: 10.0000 mg | ORAL_TABLET | ORAL | 0 refills | Status: DC | PRN

## 2017-11-02 MED ORDER — OXYCODONE HCL ER 20 MG PO T12A: 20.0000 mg | EXTENDED_RELEASE_TABLET | Freq: Two times a day (BID) | ORAL | 0 refills | Status: DC

## 2017-11-02 NOTE — Progress Notes (Signed)
PATIENT CARE CENTER INTERNAL MEDICINE AND SICKLE CELL CARE  SICKLE CELL ANEMIA FOLLOW UP VISIT PROVIDER: Lanae Boast, FNP    Subjective:   Timothy Marks  is a 27 y.o.  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 presents for a follow up for Sickle Cell Anemia. his last hospitalization was 9/1-9/11.  Pain regimen includes: Ibuprofen Oxycontin and oxycodone.  Hydrea Therapy: Yes Medication compliance: Yes  Pain today is 7/10 in left leg and lower back.  Patient reports drinking 100 ounces of fluid per day.   Patient with an eye appointment next month.  Review of Systems  Constitutional: Negative.   HENT: Negative.   Eyes: Negative.   Respiratory: Negative.   Cardiovascular: Negative.   Gastrointestinal: Negative.   Genitourinary: Negative.   Musculoskeletal: Positive for myalgias.  Skin: Negative.   Neurological: Negative.   Psychiatric/Behavioral: Negative.     Objective:   Objective  BP 131/74 (BP Location: Right Arm, Patient Position: Sitting, Cuff Size: Normal)   Pulse 81   Temp 99.3 F (37.4 C) (Oral)   Resp 14   Ht 6\' 2"  (1.88 m)   Wt 124 lb (56.2 kg)   SpO2 100%   BMI 15.92 kg/m    Physical Exam  Constitutional: He is oriented to person, place, and time. He appears well-developed and well-nourished. No distress.  HENT:  Head: Normocephalic and atraumatic.  Eyes: Pupils are equal, round, and reactive to light. Conjunctivae and EOM are normal.  Neck: Normal range of motion.  Cardiovascular: Normal rate, regular rhythm and intact distal pulses.  Murmur heard. Pulmonary/Chest: Effort normal and breath sounds normal. No respiratory distress.  Abdominal: Soft. Bowel sounds are normal. He exhibits no distension.  Musculoskeletal: Normal range of motion.  Neurological: He is alert and oriented to person, place, and  time.  Skin: Skin is warm and dry.  Psychiatric: He has a normal mood and affect. His behavior is normal. Judgment and thought content normal.  Nursing note and vitals reviewed.    Assessment/Plan:   Assessment   Encounter Diagnoses  Name Primary?  . Chronic prescription opiate use   . 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 Chronic prescription opiate use Will need to have UDS at next visit. Patient could not urinate today.   2. Hb-SS disease without crisis ([facility]) - Oxycodone HCl 10 MG TABS; Take 1 tablet (10 mg total) by mouth every 4 (four) hours as needed for up to 15 days.  Dispense: 90 tablet; Refill: 0 - oxyCODONE (OXYCONTIN) 20 mg 12 hr tablet; Take 1 tablet (20 mg total) by mouth every 12 (twelve) hours.  Dispense: 60 tablet; Refill: 0 - CBC with Differential - Comprehensive metabolic panel    Return to care as scheduled and prn. Patient verbalized understanding and agreed with plan of care.   1. Sickle cell disease - Continue Hydrea  We discussed the need for good hydration, monitoring of hydration status, avoidance of heat, cold, stress, and infection triggers. We discussed the risks and benefits of Hydrea, including bone marrow suppression, the possibility of GI upset, skin ulcers, hair thinning, and teratogenicity. The patient was reminded of the need to seek medical attention of any symptoms of bleeding, anemia, or infection. Continue folic acid 1 mg daily to prevent aplastic bone marrow crises.   2. Pulmonary evaluation - Patient denies severe  recurrent wheezes, shortness of breath with exercise, or persistent cough. If these symptoms develop, pulmonary function tests with spirometry will be ordered, and if abnormal, plan on referral to Pulmonology for further evaluation.  3. Cardiac - Routine screening for pulmonary hypertension is not recommended.  4. Eye - High risk of proliferative retinopathy. Annual eye exam with retinal exam recommended to  patient.  5. Immunization status -  Yearly influenza vaccination is recommended, as well as being up to date with Meningococcal and Pneumococcal vaccines.   6. Acute and chronic painful episodes - We discussed that pt is to receive Schedule II prescriptions only from [place]. Pt is also aware that the prescription history is available to [place] online through the [facility] CSRS. Controlled substance agreement signed. We reminded [name] that all patients receiving Schedule II narcotics must be seen for follow within one month of prescription being requested. We reviewed the terms of our pain agreement, including the need to keep medicines in a safe locked location away from children or pets, and the need to report excess sedation or constipation, measures to avoid constipation, and policies related to early refills and stolen prescriptions. According to the [place] Chronic Pain Initiative program, we have reviewed details related to analgesia, adverse effects, aberrant behaviors.  7. Iron overload from chronic transfusion.  Not applicable at this time.  If this occurs will use Exjade for management.   8. Vitamin D deficiency - Drisdol 50,000 units weekly. Patient encouraged to take as prescribed.   The above recommendations are taken from the NIH Evidence-Based Management of Sickle Cell Disease: Expert Panel Report, 20149.   [name], FNP-BC [facility] [address] [place],  [address] [phone]  This note has been created with Dragon speech recognition software and smart phrase technology. Any transcriptional errors are unintentional.

## 2017-11-02 NOTE — Patient Instructions (Signed)
Sickle Cell Anemia, Adult °Sickle cell anemia is a condition where your red blood cells are shaped like sickles. Red blood cells carry oxygen through the body. Sickle-shaped red blood cells do not live as long as normal red blood cells. They also clump together and block blood from flowing through the blood vessels. These things prevent the body from getting enough oxygen. Sickle cell anemia causes organ damage and pain. It also increases the risk of infection. °Follow these instructions at home: °· Drink enough fluid to keep your pee (urine) clear or pale yellow. Drink more in hot weather and during exercise. °· Do not smoke. Smoking lowers oxygen levels in the blood. °· Only take over-the-counter or prescription medicines as told by your doctor. °· Take antibiotic medicines as told by your doctor. Make sure you finish them even if you start to feel better. °· Take supplements as told by your doctor. °· Consider wearing a medical alert bracelet. This tells anyone caring for you in an emergency of your condition. °· When traveling, keep your medical information, doctors' names, and the medicines you take with you at all times. °· If you have a fever, do not take fever medicines right away. This could cover up a problem. Tell your doctor. °· Keep all follow-up visits with your doctor. Sickle cell anemia requires regular medical care. °Contact a doctor if: °You have a fever. °Get help right away if: °· You feel dizzy or faint. °· You have new belly (abdominal) pain, especially on the left side near the stomach area. °· You have a lasting, often uncomfortable and painful erection of the penis (priapism). If it is not treated right away, you will become unable to have sex (impotence). °· You have numbness in your arms or legs or you have a hard time moving them. °· You have a hard time talking. °· You have a fever or lasting symptoms for more than 2-3 days. °· You have a fever and your symptoms suddenly get  worse. °· You have signs or symptoms of infection. These include: °? Chills. °? Being more tired than normal (lethargy). °? Irritability. °? Poor eating. °? Throwing up (vomiting). °· You have pain that is not helped with medicine. °· You have shortness of breath. °· You have pain in your chest. °· You are coughing up pus-like or bloody mucus. °· You have a stiff neck. °· Your feet or hands swell or have pain. °· Your belly looks bloated. °· Your joints hurt. °This information is not intended to replace advice given to you by your health care provider. Make sure you discuss any questions you have with your health care provider. °Document Released: 11/21/2012 Document Revised: 07/09/2015 Document Reviewed: 09/12/2012 °Elsevier Interactive Patient Education © 2017 Elsevier Inc. ° °

## 2017-11-03 LAB — COMPREHENSIVE METABOLIC PANEL
ALT: 10 IU/L (ref 0–44)
AST: 15 IU/L (ref 0–40)
Albumin/Globulin Ratio: 1.2 (ref 1.2–2.2)
Albumin: 4.3 g/dL (ref 3.5–5.5)
Alkaline Phosphatase: 101 IU/L (ref 39–117)
BUN/Creatinine Ratio: 6 — ABNORMAL LOW (ref 9–20)
BUN: 4 mg/dL — ABNORMAL LOW (ref 6–20)
Bilirubin Total: 0.4 mg/dL (ref 0.0–1.2)
CO2: 20 mmol/L (ref 20–29)
Calcium: 9.1 mg/dL (ref 8.7–10.2)
Chloride: 103 mmol/L (ref 96–106)
Creatinine, Ser: 0.67 mg/dL — ABNORMAL LOW (ref 0.76–1.27)
GFR calc Af Amer: 150 mL/min/{1.73_m2} (ref >59)
GFR calc non Af Amer: 130 mL/min/{1.73_m2} (ref >59)
Globulin, Total: 3.5 g/dL (ref 1.5–4.5)
Glucose: 81 mg/dL (ref 65–99)
Potassium: 3.9 mmol/L (ref 3.5–5.2)
Sodium: 141 mmol/L (ref 134–144)
Total Protein: 7.8 g/dL (ref 6.0–8.5)

## 2017-11-03 LAB — CBC WITH DIFFERENTIAL/PLATELET
Basophils Absolute: 0 10*3/uL (ref 0.0–0.2)
Basos: 0 %
EOS (ABSOLUTE): 0.1 10*3/uL (ref 0.0–0.4)
Eos: 1 %
Hematocrit: 30.9 % — ABNORMAL LOW (ref 37.5–51.0)
Hemoglobin: 9.6 g/dL — ABNORMAL LOW (ref 13.0–17.7)
Immature Grans (Abs): 0.1 10*3/uL (ref 0.0–0.1)
Immature Granulocytes: 1 %
Lymphocytes Absolute: 2.8 10*3/uL (ref 0.7–3.1)
Lymphs: 31 %
MCH: 28.5 pg (ref 26.6–33.0)
MCHC: 31.1 g/dL — ABNORMAL LOW (ref 31.5–35.7)
MCV: 92 fL (ref 79–97)
Monocytes Absolute: 0.5 10*3/uL (ref 0.1–0.9)
Monocytes: 6 %
NRBC: 8 % — ABNORMAL HIGH (ref 0–0)
Neutrophils Absolute: 5.7 10*3/uL (ref 1.4–7.0)
Neutrophils: 61 %
Platelets: 995 10*3/uL (ref 150–450)
RBC: 3.37 x10E6/uL — ABNORMAL LOW (ref 4.14–5.80)
RDW: 18.9 % — ABNORMAL HIGH (ref 12.3–15.4)
WBC: 9.2 10*3/uL (ref 3.4–10.8)

## 2017-11-03 NOTE — Telephone Encounter (Signed)
This has been addressed in the office visit

## 2017-11-14 ENCOUNTER — Telehealth: Payer: Self-pay

## 2017-11-14 DIAGNOSIS — D571 Sickle-cell disease without crisis: Secondary | ICD-10-CM

## 2017-11-15 NOTE — Telephone Encounter (Signed)
Due on 11/17/2017

## 2017-11-16 MED ORDER — OXYCODONE HCL 10 MG PO TABS: 10.0000 mg | ORAL_TABLET | ORAL | 0 refills | Status: DC | PRN

## 2017-11-16 NOTE — Telephone Encounter (Signed)
Refilled

## 2017-11-28 ENCOUNTER — Telehealth: Payer: Self-pay

## 2017-11-28 NOTE — Telephone Encounter (Signed)
Called and spoke with patient, he will keep appointment on 11/30/2017

## 2017-11-30 ENCOUNTER — Ambulatory Visit (INDEPENDENT_AMBULATORY_CARE_PROVIDER_SITE_OTHER): Payer: Medicaid Other | Admitting: Family Medicine

## 2017-11-30 ENCOUNTER — Encounter: Payer: Self-pay | Admitting: Family Medicine

## 2017-11-30 VITALS — BP 120/70 | HR 60 | Temp 98.0°F | Ht 74.0 in | Wt 129.0 lb

## 2017-11-30 DIAGNOSIS — D571 Sickle-cell disease without crisis: Secondary | ICD-10-CM

## 2017-11-30 DIAGNOSIS — Z23 Encounter for immunization: Secondary | ICD-10-CM | POA: Diagnosis not present

## 2017-11-30 LAB — POCT URINALYSIS DIP (MANUAL ENTRY)
Glucose, UA: NEGATIVE mg/dL
Ketones, POC UA: NEGATIVE mg/dL
Leukocytes, UA: NEGATIVE
Nitrite, UA: NEGATIVE
Protein Ur, POC: 30 mg/dL — AB
Spec Grav, UA: 1.015 (ref 1.010–1.025)
Urobilinogen, UA: >=8 E.U./dL — AB
pH, UA: 7 (ref 5.0–8.0)

## 2017-11-30 MED ORDER — OXYCODONE HCL ER 20 MG PO T12A: 20.0000 mg | EXTENDED_RELEASE_TABLET | Freq: Two times a day (BID) | ORAL | 0 refills | Status: DC

## 2017-11-30 MED ORDER — OXYCODONE-ACETAMINOPHEN 10-325 MG PO TABS: 1.0000 | ORAL_TABLET | Freq: Four times a day (QID) | ORAL | 0 refills | Status: DC | PRN

## 2017-11-30 NOTE — Progress Notes (Signed)
PATIENT CARE CENTER INTERNAL MEDICINE AND SICKLE CELL CARE  SICKLE CELL ANEMIA FOLLOW UP VISIT PROVIDER: Lanae Boast, FNP    Subjective:   Timothy Marks  is a 27 y.o.  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 presents for a follow up for Sickle Cell Anemia. his last hospitalization was .10/15/2017  Hydrea Therapy: Yes Medication compliance: Yes  Pain today is at baseline and located in his back.     Review of Systems  Constitutional: Negative.   HENT: Negative.   Eyes: Negative.   Respiratory: Negative.   Cardiovascular: Negative.   Gastrointestinal: Negative.   Genitourinary: Negative.   Musculoskeletal: Positive for back pain and myalgias.  Skin: Negative.   Neurological: Negative.   Psychiatric/Behavioral: Negative.     Objective:   Objective  BP 120/70 (BP Location: Left Arm, Patient Position: Sitting, Cuff Size: Small)   Pulse 60   Temp 98 F (36.7 C) (Oral)   Ht 6\' 2"  (1.88 m)   Wt 129 lb (58.5 kg)   SpO2 100%   BMI 16.56 kg/m    Physical Exam  Constitutional: He is oriented to person, place, and time. He appears well-developed and well-nourished. No distress.  HENT:  Head: Normocephalic and atraumatic.  Eyes: Pupils are equal, round, and reactive to light. Conjunctivae and EOM are normal.  Neck: Normal range of motion.  Cardiovascular: Normal rate, regular rhythm, normal heart sounds and intact distal pulses.  Pulmonary/Chest: Effort normal and breath sounds normal. No respiratory distress.  Abdominal: Soft. Bowel sounds are normal. He exhibits no distension.  Musculoskeletal: Normal range of motion.  Neurological: He is alert and oriented to person, place, and time.  Skin: Skin is warm and dry.  Psychiatric: He has a normal mood and affect. His behavior is normal. Thought content normal.  Nursing note and  vitals reviewed.    Assessment/Plan:   Assessment   Encounter Diagnosis  Name Primary?  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 Hb-SS disease without crisis [facility]) Patient is requesting percocet instead of oxycodone. Refilled medications.  - POCT urinalysis dipstick - [id number] 11+Oxyco+Alc+Crt-Bund - oxyCODONE (OXYCONTIN) 20 mg 12 hr tablet; Take 1 tablet (20 mg total) by mouth every 12 (twelve) hours.  Dispense: 60 tablet; Refill: 0 - oxyCODONE-acetaminophen (PERCOCET) 10-325 MG tablet; Take 1 tablet by mouth every 6 (six) hours as needed for up to 15 days for pain.  Dispense: 60 tablet; Refill: 0 - CBC with Differential - Comprehensive metabolic panel  2. Need for immunization against influenza - Flu Vaccine QUAD 36+ mos IM  3. Need for Tdap vaccination Tdap vaccine greater than or equal to 7yo IM    Return to care as scheduled and prn. Patient verbalized understanding and agreed with plan of care.   1. Sickle cell disease - Continue Hydrea  We discussed the need for good hydration, monitoring of hydration status, avoidance of heat, cold, stress, and infection triggers. We discussed the risks and benefits of Hydrea, including bone marrow suppression, the possibility of GI upset, skin ulcers, hair thinning, and teratogenicity. The patient was reminded of the need to seek medical attention of any symptoms of bleeding, anemia, or infection. Continue folic acid 1 mg daily to prevent aplastic bone marrow crises.   2. Pulmonary evaluation - Patient denies severe recurrent wheezes, shortness of breath with exercise,  or persistent cough. If these symptoms develop, pulmonary function tests with spirometry will be ordered, and if abnormal, plan on referral to Pulmonology for further evaluation.  3. Cardiac - Routine screening for pulmonary hypertension is not recommended.  4. Eye - High risk of proliferative retinopathy. Annual eye exam with retinal exam recommended to  patient.  5. Immunization status -  Yearly influenza vaccination is recommended, as well as being up to date with Meningococcal and Pneumococcal vaccines.   6. Acute and chronic painful episodes - We discussed that pt is to receive Schedule II prescriptions only from [place]. Pt is also aware that the prescription history is available to [place] online through the [facility] CSRS. Controlled substance agreement signed.. We reminded Timothy Marks that all patients receiving Schedule II narcotics must be seen for follow within one month of prescription being requested. We reviewed the terms of our pain agreement, including the need to keep medicines in a safe locked location away from children or pets, and the need to report excess sedation or constipation, measures to avoid constipation, and policies related to early refills and stolen prescriptions. According to the [place] Chronic Pain Initiative program, we have reviewed details related to analgesia, adverse effects, aberrant behaviors.  7. Iron overload from chronic transfusion.  Not applicable at this time.  If this occurs will use Exjade for management.   8. Vitamin D deficiency - Drisdol 50,000 units weekly. Patient encouraged to take as prescribed.   The above recommendations are taken from the NIH Evidence-Based Management of Sickle Cell Disease: Expert Panel Report, 20149.   Ms. Timothy L. Nathaneil Canary, FNP-BC [facility] [address] [place], [place] [address] [phone]  This note has been created with Dragon speech recognition software and smart phrase technology. Any transcriptional errors are unintentional.

## 2017-11-30 NOTE — Patient Instructions (Signed)
Sickle Cell Anemia, Adult °Sickle cell anemia is a condition where your red blood cells are shaped like sickles. Red blood cells carry oxygen through the body. Sickle-shaped red blood cells do not live as long as normal red blood cells. They also clump together and block blood from flowing through the blood vessels. These things prevent the body from getting enough oxygen. Sickle cell anemia causes organ damage and pain. It also increases the risk of infection. °Follow these instructions at home: °· Drink enough fluid to keep your pee (urine) clear or pale yellow. Drink more in hot weather and during exercise. °· Do not smoke. Smoking lowers oxygen levels in the blood. °· Only take over-the-counter or prescription medicines as told by your doctor. °· Take antibiotic medicines as told by your doctor. Make sure you finish them even if you start to feel better. °· Take supplements as told by your doctor. °· Consider wearing a medical alert bracelet. This tells anyone caring for you in an emergency of your condition. °· When traveling, keep your medical information, doctors' names, and the medicines you take with you at all times. °· If you have a fever, do not take fever medicines right away. This could cover up a problem. Tell your doctor. °· Keep all follow-up visits with your doctor. Sickle cell anemia requires regular medical care. °Contact a doctor if: °You have a fever. °Get help right away if: °· You feel dizzy or faint. °· You have new belly (abdominal) pain, especially on the left side near the stomach area. °· You have a lasting, often uncomfortable and painful erection of the penis (priapism). If it is not treated right away, you will become unable to have sex (impotence). °· You have numbness in your arms or legs or you have a hard time moving them. °· You have a hard time talking. °· You have a fever or lasting symptoms for more than 2-3 days. °· You have a fever and your symptoms suddenly get  worse. °· You have signs or symptoms of infection. These include: °? Chills. °? Being more tired than normal (lethargy). °? Irritability. °? Poor eating. °? Throwing up (vomiting). °· You have pain that is not helped with medicine. °· You have shortness of breath. °· You have pain in your chest. °· You are coughing up pus-like or bloody mucus. °· You have a stiff neck. °· Your feet or hands swell or have pain. °· Your belly looks bloated. °· Your joints hurt. °This information is not intended to replace advice given to you by your health care provider. Make sure you discuss any questions you have with your health care provider. °Document Released: 11/21/2012 Document Revised: 07/09/2015 Document Reviewed: 09/12/2012 °Elsevier Interactive Patient Education © 2017 Elsevier Inc. ° °

## 2017-12-01 LAB — COMPREHENSIVE METABOLIC PANEL
ALT: 10 IU/L (ref 0–44)
AST: 29 IU/L (ref 0–40)
Albumin/Globulin Ratio: 1.4 (ref 1.2–2.2)
Albumin: 4.7 g/dL (ref 3.5–5.5)
Alkaline Phosphatase: 101 IU/L (ref 39–117)
BUN/Creatinine Ratio: 6 — ABNORMAL LOW (ref 9–20)
BUN: 5 mg/dL — ABNORMAL LOW (ref 6–20)
Bilirubin Total: 2.7 mg/dL — ABNORMAL HIGH (ref 0.0–1.2)
CO2: 21 mmol/L (ref 20–29)
Calcium: 9.5 mg/dL (ref 8.7–10.2)
Chloride: 102 mmol/L (ref 96–106)
Creatinine, Ser: 0.77 mg/dL (ref 0.76–1.27)
GFR calc Af Amer: 142 mL/min/{1.73_m2} (ref >59)
GFR calc non Af Amer: 123 mL/min/{1.73_m2} (ref >59)
Globulin, Total: 3.4 g/dL (ref 1.5–4.5)
Glucose: 82 mg/dL (ref 65–99)
Potassium: 3.7 mmol/L (ref 3.5–5.2)
Sodium: 143 mmol/L (ref 134–144)
Total Protein: 8.1 g/dL (ref 6.0–8.5)

## 2017-12-01 LAB — CBC WITH DIFFERENTIAL/PLATELET
Basophils Absolute: 0 10*3/uL (ref 0.0–0.2)
Basos: 0 %
EOS (ABSOLUTE): 0.1 10*3/uL (ref 0.0–0.4)
Eos: 1 %
Hematocrit: 38.6 % (ref 37.5–51.0)
Hemoglobin: 12.5 g/dL — ABNORMAL LOW (ref 13.0–17.7)
Immature Grans (Abs): 0 10*3/uL (ref 0.0–0.1)
Immature Granulocytes: 0 %
Lymphocytes Absolute: 2.2 10*3/uL (ref 0.7–3.1)
Lymphs: 26 %
MCH: 26.3 pg — ABNORMAL LOW (ref 26.6–33.0)
MCHC: 32.4 g/dL (ref 31.5–35.7)
MCV: 81 fL (ref 79–97)
Monocytes Absolute: 0.4 10*3/uL (ref 0.1–0.9)
Monocytes: 5 %
NRBC: 1 % — ABNORMAL HIGH (ref 0–0)
Neutrophils Absolute: 5.8 10*3/uL (ref 1.4–7.0)
Neutrophils: 68 %
Platelets: 397 10*3/uL (ref 150–450)
RBC: 4.75 x10E6/uL (ref 4.14–5.80)
RDW: 18.6 % — ABNORMAL HIGH (ref 12.3–15.4)
WBC: 8.6 10*3/uL (ref 3.4–10.8)

## 2017-12-02 DIAGNOSIS — R4689 Other symptoms and signs involving appearance and behavior: Secondary | ICD-10-CM | POA: Insufficient documentation

## 2017-12-04 LAB — DRUG SCREEN 764883 11+OXYCO+ALC+CRT-BUND
Amphetamines, Urine: NEGATIVE ng/mL
BENZODIAZ UR QL: NEGATIVE ng/mL
Barbiturate: NEGATIVE ng/mL
Cocaine (Metabolite): NEGATIVE ng/mL
Creatinine: 155.7 mg/dL (ref 20.0–300.0)
Ethanol: NEGATIVE %
Meperidine: NEGATIVE ng/mL
Methadone Screen, Urine: NEGATIVE ng/mL
OPIATE SCREEN URINE: NEGATIVE ng/mL
Oxycodone/Oxymorphone, Urine: NEGATIVE ng/mL
Phencyclidine: NEGATIVE ng/mL
Propoxyphene: NEGATIVE ng/mL
Tramadol: NEGATIVE ng/mL
pH, Urine: 7.6 (ref 4.5–8.9)

## 2017-12-04 LAB — CANNABINOID CONFIRMATION, UR
CANNABINOIDS: POSITIVE — AB
Carboxy THC GC/MS Conf: >400 ng/mL

## 2017-12-05 ENCOUNTER — Telehealth (HOSPITAL_COMMUNITY): Payer: Self-pay

## 2017-12-05 ENCOUNTER — Telehealth: Payer: Self-pay
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
## 2017-12-06 NOTE — Telephone Encounter (Signed)
Please schedule patient for a same day appointment for evaluation by a provider. Thank you

## 2017-12-06 NOTE — Telephone Encounter (Signed)
Per Andria Frames patient is currently in hospital and will call to schedule appointment as soon as he is released.

## 2017-12-12 ENCOUNTER — Other Ambulatory Visit: Payer: Self-pay

## 2017-12-12 ENCOUNTER — Telehealth: Payer: Self-pay

## 2017-12-12 NOTE — Telephone Encounter (Signed)
Received paperwork and call from [facility] (caseworker with hospital ) regarding patient. 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 She faxed notes from hospital. I spoke with Dr. Doreene Burke regarding this and he reviewed paper work. He advised that patient needs to have a follow up asap. Spoke with Andria Frames and she has scheduled patient appointment to follow up on this with Kathe Becton for 12/13/2017 @1 :20pm. Patient is aware. Thanks!

## 2017-12-13 ENCOUNTER — Encounter: Payer: Self-pay | Admitting: Family Medicine

## 2017-12-13 ENCOUNTER — Ambulatory Visit (INDEPENDENT_AMBULATORY_CARE_PROVIDER_SITE_OTHER): Payer: Medicaid Other | Admitting: Family Medicine

## 2017-12-13 VITALS — BP 116/68 | HR 80 | Temp 98.1°F | Ht 74.0 in | Wt 124.0 lb

## 2017-12-13 DIAGNOSIS — I82621 Acute embolism and thrombosis of deep veins of right upper extremity: Secondary | ICD-10-CM | POA: Diagnosis not present

## 2017-12-13 DIAGNOSIS — D571 Sickle-cell disease without crisis: Secondary | ICD-10-CM | POA: Diagnosis not present

## 2017-12-13 DIAGNOSIS — G894 Chronic pain syndrome: Secondary | ICD-10-CM

## 2017-12-13 DIAGNOSIS — Z09 Encounter for follow-up examination after completed treatment for conditions other than malignant neoplasm: Secondary | ICD-10-CM

## 2017-12-13 LAB — POCT URINALYSIS DIP (MANUAL ENTRY)
Bilirubin, UA: NEGATIVE
Blood, UA: NEGATIVE
Glucose, UA: NEGATIVE mg/dL
Ketones, POC UA: NEGATIVE mg/dL
Leukocytes, UA: NEGATIVE
Nitrite, UA: NEGATIVE
Spec Grav, UA: 1.015 (ref 1.010–1.025)
Urobilinogen, UA: 4 E.U./dL — AB
pH, UA: 7.5 (ref 5.0–8.0)

## 2017-12-13 MED ORDER — GABAPENTIN 300 MG PO CAPS: 300.0000 mg | ORAL_CAPSULE | Freq: Three times a day (TID) | ORAL | 0 refills | Status: DC

## 2017-12-13 MED ORDER — ELIQUIS 5 MG VTE STARTER PACK: ORAL_TABLET | ORAL | 0 refills | Status: DC

## 2017-12-13 MED ORDER — VITAMIN D3 25 MCG (1000 UNIT) PO TABS: 2000.0000 [IU] | ORAL_TABLET | Freq: Every day | ORAL | 6 refills | Status: DC

## 2017-12-13 MED ORDER — OXYCODONE-ACETAMINOPHEN 10-325 MG PO TABS: 1.0000 | ORAL_TABLET | Freq: Four times a day (QID) | ORAL | 0 refills | Status: DC | PRN

## 2017-12-13 NOTE — Patient Instructions (Addendum)
Gabapentin capsules or tablets What is this medicine? GABAPENTIN (GA ba pen tin) is used to control partial seizures in adults with epilepsy. It is also used to treat certain types of nerve pain. This medicine may be used for other purposes; ask your health care provider or pharmacist if you have questions. COMMON BRAND NAME(S): Active-PAC with Gabapentin, Gabarone, Neurontin What should I tell my health care provider before I take this medicine? They need to know if you have any of these conditions: -kidney disease -suicidal thoughts, plans, or attempt; a previous suicide attempt by you or a family member -an unusual or allergic reaction to gabapentin, other medicines, foods, dyes, or preservatives -pregnant or trying to get pregnant -breast-feeding How should I use this medicine? Take this medicine by mouth with a glass of water. Follow the directions on the prescription label. You can take it with or without food. If it upsets your stomach, take it with food.Take your medicine at regular intervals. Do not take it more often than directed. Do not stop taking except on your doctor's advice. If you are directed to break the 600 or 800 mg tablets in half as part of your dose, the extra half tablet should be used for the next dose. If you have not used the extra half tablet within 28 days, it should be thrown away. A special MedGuide will be given to you by the pharmacist with each prescription and refill. Be sure to read this information carefully each time. Talk to your pediatrician regarding the use of this medicine in children. Special care may be needed. Overdosage: If you think you have taken too much of this medicine contact a poison control center or emergency room at once. NOTE: This medicine is only for you. Do not share this medicine with others. What if I miss a dose? If you miss a dose, take it as soon as you can. If it is almost time for your next dose, take only that dose. Do not  take double or extra doses. What may interact with this medicine? Do not take this medicine with any of the following medications: -other gabapentin products This medicine may also interact with the following medications: -alcohol -antacids -antihistamines for allergy, cough and cold -certain medicines for anxiety or sleep -certain medicines for depression or psychotic disturbances -homatropine; hydrocodone -naproxen -narcotic medicines (opiates) for pain -phenothiazines like chlorpromazine, mesoridazine, prochlorperazine, thioridazine This list may not describe all possible interactions. Give your health care provider a list of all the medicines, herbs, non-prescription drugs, or dietary supplements you use. Also tell them if you smoke, drink alcohol, or use illegal drugs. Some items may interact with your medicine. What should I watch for while using this medicine? Visit your doctor or health care professional for regular checks on your progress. You may want to keep a record at home of how you feel your condition is responding to treatment. You may want to share this information with your doctor or health care professional at each visit. You should contact your doctor or health care professional if your seizures get worse or if you have any new types of seizures. Do not stop taking this medicine or any of your seizure medicines unless instructed by your doctor or health care professional. Stopping your medicine suddenly can increase your seizures or their severity. Wear a medical identification bracelet or chain if you are taking this medicine for seizures, and carry a card that lists all your medications. You may get drowsy, dizzy,   or have blurred vision. Do not drive, use machinery, or do anything that needs mental alertness until you know how this medicine affects you. To reduce dizzy or fainting spells, do not sit or stand up quickly, especially if you are an older patient. Alcohol can  increase drowsiness and dizziness. Avoid alcoholic drinks. Your mouth may get dry. Chewing sugarless gum or sucking hard candy, and drinking plenty of water will help. The use of this medicine may increase the chance of suicidal thoughts or actions. Pay special attention to how you are responding while on this medicine. Any worsening of mood, or thoughts of suicide or dying should be reported to your health care professional right away. Women who become pregnant while using this medicine may enroll in the [place] Pregnancy Registry by calling [phone]. This registry collects information about the safety of antiepileptic drug use during pregnancy. What side effects may I notice from receiving this medicine? Side effects that you should report to your doctor or health care professional as soon as possible: -allergic reactions like skin rash, itching or hives, swelling of the face, lips, or tongue -worsening of mood, thoughts or actions of suicide or dying Side effects that usually do not require medical attention (report to your doctor or health care professional if they continue or are bothersome): -constipation -difficulty walking or controlling muscle movements -dizziness -nausea -slurred speech -tiredness -tremors -weight gain This list may not describe all possible side effects. Call your doctor for medical advice about side effects. You may report side effects to FDA at 1-800-FDA-1088. Where should I keep my medicine? Keep out of reach of children. This medicine may cause accidental overdose and death if it taken by other adults, children, or pets. Mix any unused medicine with a substance like cat litter or coffee grounds. Then throw the medicine away in a sealed container like a sealed bag or a coffee can with a lid. Do not use the medicine after the expiration date. Store at room temperature between 15 and 30 degrees C (59 and 86 degrees F). NOTE: This  sheet is a summary. It may not cover all possible information. If you have questions about this medicine, talk to your doctor, pharmacist, or health care provider.  2018 Elsevier/Gold Standard (2013-03-29 15:26:50)         Apixaban oral tablets What is this medicine? APIXABAN (a PIX a ban) is an anticoagulant (blood thinner). It is used to lower the chance of stroke in people with a medical condition called atrial fibrillation. It is also used to treat or prevent blood clots in the lungs or in the veins. This medicine may be used for other purposes; ask your health care provider or pharmacist if you have questions. COMMON BRAND NAME(S): Eliquis What should I tell my health care provider before I take this medicine? They need to know if you have any of these conditions: -bleeding disorders -bleeding in the brain -blood in your stools (black or tarry stools) or if you have blood in your vomit -history of stomach bleeding -kidney disease -liver disease -mechanical heart valve -an unusual or allergic reaction to apixaban, other medicines, foods, dyes, or preservatives -pregnant or trying to get pregnant -breast-feeding How should I use this medicine? Take this medicine by mouth with a glass of water. Follow the directions on the prescription label. You can take it with or without food. If it upsets your stomach, take it with food. Take your medicine at regular intervals. Do not  take it more often than directed. Do not stop taking except on your doctor's advice. Stopping this medicine may increase your risk of a blot clot. Be sure to refill your prescription before you run out of medicine. Talk to your pediatrician regarding the use of this medicine in children. Special care may be needed. Overdosage: If you think you have taken too much of this medicine contact a poison control center or emergency room at once. NOTE: This medicine is only for you. Do not share this medicine with  others. What if I miss a dose? If you miss a dose, take it as soon as you can. If it is almost time for your next dose, take only that dose. Do not take double or extra doses. What may interact with this medicine? This medicine may interact with the following: -aspirin and aspirin-like medicines -certain medicines for fungal infections like ketoconazole and itraconazole -certain medicines for seizures like carbamazepine and phenytoin -certain medicines that treat or prevent blood clots like warfarin, enoxaparin, and dalteparin -clarithromycin -NSAIDs, medicines for pain and inflammation, like ibuprofen or naproxen -rifampin -ritonavir -St. John's wort This list may not describe all possible interactions. Give your health care provider a list of all the medicines, herbs, non-prescription drugs, or dietary supplements you use. Also tell them if you smoke, drink alcohol, or use illegal drugs. Some items may interact with your medicine. What should I watch for while using this medicine? Visit your doctor or health care professional for regular checks on your progress. Notify your doctor or health care professional and seek emergency treatment if you develop breathing problems; changes in vision; chest pain; severe, sudden headache; pain, swelling, warmth in the leg; trouble speaking; sudden numbness or weakness of the face, arm or leg. These can be signs that your condition has gotten worse. If you are going to have surgery or other procedure, tell your doctor that you are taking this medicine. What side effects may I notice from receiving this medicine? Side effects that you should report to your doctor or health care professional as soon as possible: -allergic reactions like skin rash, itching or hives, swelling of the face, lips, or tongue -signs and symptoms of bleeding such as bloody or black, tarry stools; red or dark-brown urine; spitting up blood or brown material that looks like coffee  grounds; red spots on the skin; unusual bruising or bleeding from the eye, gums, or nose This list may not describe all possible side effects. Call your doctor for medical advice about side effects. You may report side effects to FDA at 1-800-FDA-1088. Where should I keep my medicine? Keep out of the reach of children. Store at room temperature between 20 and 25 degrees C (68 and 77 degrees F). Throw away any unused medicine after the expiration date. NOTE: This sheet is a summary. It may not cover all possible information. If you have questions about this medicine, talk to your doctor, pharmacist, or health care provider.  2018 Elsevier/Gold Standard (2015-08-24 11:54:23)

## 2017-12-13 NOTE — Progress Notes (Signed)
Sickle Cell Anemia--Hospital Follow Up  Subjective:    Patient ID: Timothy Marks, male    DOB: 04/24/1990, 27 y.o.   MRN: [id number]  Chief Complaint  Patient presents with  . Hospitalization Follow-up   HPI  Timothy Marks is a 27 year old male with a past medical history of Sickle Cell Anemia, Vitamin D-Deficiency, Osteonecrosis of left shoulder, and Depression. He is here today for assessment and Hospital Follow Up.   Current Status: Since his last hospital admission on 12/01/2017 for Sickle Cell Crisis, Leukocytosis and Sepsis. He was also diagnosed with upper extremity DVT. He was scheduled to begin anticoagulant therapy with Eliquis at discharge, but he did not receive Rx. Today he is doing well with no complaints . He states that he has pain in his back and right shoulder. He rates his pain today at  7/10. He is currently taking all medications as prescribed and staying well hydrated. He reports occasional dizziness and headaches. He is accompanied today by his cousin.   He denies fevers, chills, fatigue, recent infections, weight loss, and night sweats. He has not had any headaches, visual changes, dizziness, and falls. No chest pain, heart palpitations, cough and shortness of breath reported. No reports of GI problems such as nausea, vomiting, diarrhea, and constipation. She has no reports of blood in stools, dysuria and hematuria. No depression or anxiety reported.   Review of Systems  Constitutional: Negative.   HENT: Negative.   Respiratory: Negative.   Gastrointestinal: Negative.   Musculoskeletal: Positive for arthralgias (shoulder) and back pain.  Skin: Negative.   Psychiatric/Behavioral: Negative.    Objective:   Physical Exam  Constitutional: He is oriented to person, place, and time.  Pulmonary/Chest: Effort normal and breath sounds normal.  Neurological: He is alert and oriented to person, place, and time.  Skin: Skin is warm and dry.   Assessment & Plan:   1. Hb-SS  disease without crisis [facility]) He is doing well today. He will continue to take pain medications as prescribed; will continue to avoid extreme heat and cold; will continue to eat a healthy diet and drink at least 64 ounces of water daily; continue stool softener as needed; will avoid colds and flu; will continue to get plenty of sleep and rest; will continue to avoid high stressful situations and remain infection free; will continue Folic Acid 1 mg daily to avoid sickle cell crisis.  - POCT urinalysis dipstick - oxyCODONE-acetaminophen (PERCOCET) 10-325 MG tablet; Take 1 tablet by mouth every 6 (six) hours as needed for up to 15 days for pain.  Dispense: 60 tablet; Refill: 0 - gabapentin (NEURONTIN) 300 MG capsule; Take 1 capsule (300 mg total) by mouth 3 (three) times daily. For 30 days.  Dispense: 90 capsule; Refill: 0  2. Chronic pain syndrome Continue pain medications as prescribed.   3. Acute deep vein thrombosis (DVT) of other vein of right upper extremity ([place]) Patient was discharge from hospital without receiving new Rxs on 12/09/2017. We will send Rx to pharmacy today. [facility] Discharge Summary received today and reviewed for clarification. We will initiate Eliquis today. Patient is to remain on Eliquis therapy for at least 3-6 months. He will follow up with Hematologist.  - ELIQUIS STARTER PACK (ELIQUIS STARTER PACK) 5 MG TABS; Take as directed on package: start with two-5mg  tablets twice daily for 7 days. On day 8, switch to one-5mg  tablet twice daily.  Dispense: 1 each; Refill: 0  4. Follow up He will keep follow up  appointment with Venora Maples, NP for 12/2017.   Meds ordered this encounter  Medications  . oxyCODONE-acetaminophen (PERCOCET) 10-325 MG tablet    Sig: Take 1 tablet by mouth every 6 (six) hours as needed for up to 15 days for pain.    Dispense:  60 tablet    Refill:  0    Please do not refill medication prior to 12/15/2017. Thank you.    Order Specific Question:   Supervising  Provider    Answer:   Tresa Garter [id number]  . cholecalciferol (VITAMIN D) 1000 units tablet    Sig: Take 2 tablets (2,000 Units total) by mouth daily.    Dispense:  30 tablet    Refill:  6  . gabapentin (NEURONTIN) 300 MG capsule    Sig: Take 1 capsule (300 mg total) by mouth 3 (three) times daily. For 30 days.    Dispense:  90 capsule    Refill:  0  . ELIQUIS STARTER PACK (ELIQUIS STARTER PACK) 5 MG TABS    Sig: Take as directed on package: start with two-5mg  tablets twice daily for 7 days. On day 8, switch to one-5mg  tablet twice daily.    Dispense:  1 each    Refill:  0   Kathe Becton,  MSN, FNP-C Patient [facility] [address] [place], [place] [address] [phone]

## 2017-12-13 NOTE — Telephone Encounter (Signed)
PA has been submitted for Oxycodone and can take up to 24hrs for approval.

## 2017-12-14 ENCOUNTER — Other Ambulatory Visit: Payer: Self-pay | Admitting: Internal Medicine

## 2017-12-14 ENCOUNTER — Telehealth: Payer: Self-pay

## 2017-12-15 NOTE — Telephone Encounter (Signed)
This has been completed today 11/1/12019 @8 :22am. Thanks!

## 2017-12-18 ENCOUNTER — Telehealth: Payer: Self-pay

## 2017-12-19 ENCOUNTER — Telehealth: Payer: Self-pay

## 2017-12-19 ENCOUNTER — Other Ambulatory Visit: Payer: Self-pay | Admitting: Internal Medicine

## 2017-12-19 NOTE — Telephone Encounter (Signed)
Not due until 11/16.

## 2017-12-20 ENCOUNTER — Other Ambulatory Visit: Payer: Self-pay | Admitting: Family Medicine

## 2017-12-20 DIAGNOSIS — D571 Sickle-cell disease without crisis: Secondary | ICD-10-CM

## 2017-12-20 DIAGNOSIS — G894 Chronic pain syndrome: Secondary | ICD-10-CM

## 2017-12-20 MED ORDER — IBUPROFEN 800 MG PO TABS: 800.0000 mg | ORAL_TABLET | Freq: Three times a day (TID) | ORAL | 0 refills | Status: DC | PRN

## 2017-12-20 NOTE — Telephone Encounter (Signed)
Refill for Motrin sent to pharmacy today.

## 2017-12-20 NOTE — Progress Notes (Signed)
Rx for Motrin to pharmacy today. Patient is aware.

## 2017-12-28 ENCOUNTER — Telehealth: Payer: Self-pay

## 2017-12-28 ENCOUNTER — Other Ambulatory Visit: Payer: Self-pay | Admitting: Family Medicine

## 2017-12-28 DIAGNOSIS — D571 Sickle-cell disease without crisis: Secondary | ICD-10-CM

## 2017-12-28 MED ORDER — OXYCODONE-ACETAMINOPHEN 10-325 MG PO TABS: 1.0000 | ORAL_TABLET | Freq: Four times a day (QID) | ORAL | 0 refills | Status: DC | PRN

## 2017-12-28 NOTE — Progress Notes (Signed)
jamone, garrido 27 year old male with a history of sickle cell anemia and chronic pain syndrome is requesting refill on chronic pain medication. Reviewed [place] Substance Reporting system prior to prescribing opiate medications. No inconsistencies noted.   Meds ordered this encounter  Medications  . oxyCODONE-acetaminophen (PERCOCET) 10-325 MG tablet    Sig: Take 1 tablet by mouth every 6 (six) hours as needed for up to 15 days for pain.    Dispense:  60 tablet    Refill:  0    Please do not refill medication prior to 12/29/2017    Order Specific Question:   Supervising Provider    Answer:   Tresa Garter [[id number]]     [place], MSN, FNP-C Patient [facility] [address] [place], [place] [address] [phone]

## 2017-12-29 ENCOUNTER — Telehealth: Payer: Self-pay

## 2017-12-29 ENCOUNTER — Other Ambulatory Visit: Payer: Self-pay | Admitting: Internal Medicine

## 2017-12-29 DIAGNOSIS — D571 Sickle-cell disease without crisis: Secondary | ICD-10-CM

## 2017-12-30 MED ORDER — OXYCODONE HCL ER 20 MG PO T12A: 20.0000 mg | EXTENDED_RELEASE_TABLET | Freq: Two times a day (BID) | ORAL | 0 refills | Status: DC

## 2017-12-30 NOTE — Telephone Encounter (Signed)
Sent!

## 2018-01-04 ENCOUNTER — Ambulatory Visit: Payer: Self-pay | Admitting: Family Medicine

## 2018-01-05 ENCOUNTER — Telehealth: Payer: Self-pay

## 2018-01-05 DIAGNOSIS — D571 Sickle-cell disease without crisis: Secondary | ICD-10-CM

## 2018-01-05 MED ORDER — OXYCODONE HCL ER 20 MG PO T12A: 20.0000 mg | EXTENDED_RELEASE_TABLET | Freq: Two times a day (BID) | ORAL | 0 refills | Status: DC

## 2018-01-05 NOTE — Telephone Encounter (Signed)
Patient called and asked if his medication could be sent to different [facility] due to the one it was sent to did not have enough in stock. Patient asked that we send his oxycodone to [facility] in [place]. Patient asked that both of his pain medications Oxycontin and oxycodone be sent in today. Patient also wants to know why is rx is only for 60 tablets, he states he use to get 90 tablets. I advised that I would send the message to his provider. He then got angry asking how long this was going to take cause it should only take 5 minutes and there is no reason why the provider can't send this in real quick. I advised him that we are seeing patients but I will send the message. He asked for the Dr's direct line and demanded to speak to her. I advised that she is seeing patients and I would give her the message. He stated "what do I have to do to get this done? Come up there and show my ass"?   Please advise, Please call patient and let him know why medications quantities  have been lowered and if this will be sent in. Thanks!

## 2018-01-07 ENCOUNTER — Other Ambulatory Visit: Payer: Self-pay | Admitting: Family Medicine

## 2018-01-07 DIAGNOSIS — I82621 Acute embolism and thrombosis of deep veins of right upper extremity: Secondary | ICD-10-CM

## 2018-01-11 ENCOUNTER — Other Ambulatory Visit: Payer: Self-pay | Admitting: Family Medicine

## 2018-01-11 DIAGNOSIS — D571 Sickle-cell disease without crisis: Secondary | ICD-10-CM

## 2018-01-15 ENCOUNTER — Telehealth: Payer: Self-pay

## 2018-01-15 ENCOUNTER — Other Ambulatory Visit: Payer: Self-pay | Admitting: Family Medicine

## 2018-01-15 DIAGNOSIS — D571 Sickle-cell disease without crisis: Secondary | ICD-10-CM

## 2018-01-15 MED ORDER — OXYCODONE-ACETAMINOPHEN 10-325 MG PO TABS: 1.0000 | ORAL_TABLET | Freq: Four times a day (QID) | ORAL | 0 refills | Status: DC | PRN

## 2018-01-15 MED ORDER — GABAPENTIN 300 MG PO CAPS: 300.0000 mg | ORAL_CAPSULE | Freq: Three times a day (TID) | ORAL | 5 refills | Status: DC

## 2018-01-15 NOTE — Telephone Encounter (Signed)
Refilled. Sent to [facility]

## 2018-01-16 ENCOUNTER — Other Ambulatory Visit: Payer: Self-pay

## 2018-01-16 DIAGNOSIS — D571 Sickle-cell disease without crisis: Secondary | ICD-10-CM

## 2018-01-16 MED ORDER — GABAPENTIN 300 MG PO CAPS: 300.0000 mg | ORAL_CAPSULE | Freq: Three times a day (TID) | ORAL | 5 refills | Status: DC

## 2018-01-18 ENCOUNTER — Encounter: Payer: Self-pay | Admitting: Family Medicine

## 2018-01-18 ENCOUNTER — Ambulatory Visit (INDEPENDENT_AMBULATORY_CARE_PROVIDER_SITE_OTHER): Payer: Medicaid Other | Admitting: Family Medicine

## 2018-01-18 VITALS — BP 118/70 | HR 90 | Temp 97.9°F | Resp 16 | Ht 74.0 in | Wt 124.0 lb

## 2018-01-18 DIAGNOSIS — D571 Sickle-cell disease without crisis: Secondary | ICD-10-CM | POA: Diagnosis not present

## 2018-01-18 LAB — POCT URINALYSIS DIPSTICK
Glucose, UA: NEGATIVE
Ketones, UA: NEGATIVE
Leukocytes, UA: NEGATIVE
Nitrite, UA: NEGATIVE
Protein, UA: POSITIVE — AB
Spec Grav, UA: 1.015 (ref 1.010–1.025)
Urobilinogen, UA: >=8 E.U./dL — AB
pH, UA: 7 (ref 5.0–8.0)

## 2018-01-18 NOTE — Progress Notes (Signed)
[facility] INTERNAL MEDICINE AND SICKLE CELL CARE  SICKLE CELL ANEMIA FOLLOW UP VISIT PROVIDER: Lanae Boast, FNP    Subjective:   Timothy Marks  is a 27 y.o.  male who  has a past medical history of Depression, 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, left shoulder ([place]) (y-3), Sickle cell anemia ([place]), and Vitamin D deficiency (y-6). presents for a follow up for Sickle Cell Anemia. The patient has had 1 admissions in the past 6 months.  Pain regimen includes: Ibuprofen, percocet and oxycontin 20 mg.  Hydrea Therapy: Yes Medication compliance: Yes  Pain today is 7/10 The patient reports adequate daily hydration.    Review of Systems  Constitutional: Negative.   HENT: Negative.   Eyes: Negative.   Respiratory: Negative.   Cardiovascular: Negative.   Gastrointestinal: Negative.   Genitourinary: Negative.   Musculoskeletal: Positive for back pain, joint pain and myalgias.  Skin: Negative.   Neurological: Negative.   Psychiatric/Behavioral: Negative.     Objective:   Objective  BP 118/70 (BP Location: Left Arm, Patient Position: Sitting, Cuff Size: Normal)   Pulse 90   Temp 97.9 F (36.6 C) (Oral)   Resp 16   Ht 6\' 2"  (1.88 m)   Wt 124 lb (56.2 kg)   SpO2 99%   BMI 15.92 kg/m   Wt Readings from Last 3 Encounters:  01/18/18 124 lb (56.2 kg)  12/13/17 124 lb (56.2 kg)  11/30/17 129 lb (58.5 kg)     Physical Exam  Constitutional: He is oriented to person, place, and time. He appears well-developed and well-nourished. No distress.  HENT:  Head: Normocephalic and atraumatic.  Eyes: Pupils are equal, round, and reactive to light. Conjunctivae and EOM are normal.  Neck: Normal range of motion.  Cardiovascular: Normal rate, regular rhythm, normal heart sounds and intact distal pulses.  Pulmonary/Chest: Effort normal and breath sounds normal. No respiratory distress.  Abdominal: Soft. Bowel sounds are normal. He exhibits no distension.    Musculoskeletal: Normal range of motion. He exhibits tenderness.  Neurological: He is alert and oriented to person, place, and time.  Skin: Skin is warm and dry.  Psychiatric: He has a normal mood and affect. His behavior is normal. Thought content normal.  Nursing note and vitals reviewed.    Assessment/Plan:   Assessment   Encounter Diagnosis  Name Primary?  Marland Kitchen Hb-SS disease without crisis ([place]) Yes     Plan  1. Hb-SS disease without crisis [facility]) Patient requesting refill on medications. Next refill due on 02/04/2018. At that time, will change medications to oxycontin 15 q8h and keep the percocet prescription the same.  - Urinalysis Dipstick   Return to care as scheduled and prn. Patient verbalized understanding and agreed with plan of care.   1. Sickle cell disease - Continue Hydrea We discussed the need for good hydration, monitoring of hydration status, avoidance of heat, cold, stress, and infection triggers. We discussed the risks and benefits of Hydrea, including bone marrow suppression, the possibility of GI upset, skin ulcers, hair thinning, and teratogenicity. The patient was reminded of the need to seek medical attention of any symptoms of bleeding, anemia, or infection. Continue folic acid 1 mg daily to prevent aplastic bone marrow crises.   2. Pulmonary evaluation - Patient denies severe recurrent wheezes, shortness of breath with exercise, or persistent cough. If these symptoms develop, pulmonary function tests with spirometry will be ordered, and if abnormal, plan on referral to Pulmonology for further evaluation.  3. Cardiac -  Routine screening for pulmonary hypertension is not recommended.  4. Eye - High risk of proliferative retinopathy. Annual eye exam with retinal exam recommended to patient.  5. Immunization status -  Yearly influenza vaccination is recommended, as well as being up to date with Meningococcal and Pneumococcal vaccines.   6. Acute and chronic  painful episodes - We discussed that pt is to receive Schedule II prescriptions only from [place]. Pt is also aware that the prescription history is available to [place] online through the [facility] CSRS. Controlled substance agreement signed. We reminded Nazim Kadlec that all patients receiving Schedule II narcotics must be seen for follow within one month of prescription being requested. We reviewed the terms of our pain agreement, including the need to keep medicines in a safe locked location away from children or pets, and the need to report excess sedation or constipation, measures to avoid constipation, and policies related to early refills and stolen prescriptions. According to the [place] Chronic Pain Initiative program, we have reviewed details related to analgesia, adverse effects, aberrant behaviors.  7. Iron overload from chronic transfusion.  Not applicable at this time.  If this occurs will use Exjade for management.   8. Vitamin D deficiency - Drisdol 50,000 units weekly. Patient encouraged to take as prescribed.   The above recommendations are taken from the NIH Evidence-Based Management of Sickle Cell Disease: Expert Panel Report, 20149.   [name], FNP-BC Patient [facility] [address] [place], [place] [address] [phone]  This note has been created with Dragon speech recognition software and smart phrase technology. Any transcriptional errors are unintentional.

## 2018-01-18 NOTE — Patient Instructions (Signed)
Sickle Cell Anemia, Adult °Sickle cell anemia is a condition where your red blood cells are shaped like sickles. Red blood cells carry oxygen through the body. Sickle-shaped red blood cells do not live as long as normal red blood cells. They also clump together and block blood from flowing through the blood vessels. These things prevent the body from getting enough oxygen. Sickle cell anemia causes organ damage and pain. It also increases the risk of infection. °Follow these instructions at home: °· Drink enough fluid to keep your pee (urine) clear or pale yellow. Drink more in hot weather and during exercise. °· Do not smoke. Smoking lowers oxygen levels in the blood. °· Only take over-the-counter or prescription medicines as told by your doctor. °· Take antibiotic medicines as told by your doctor. Make sure you finish them even if you start to feel better. °· Take supplements as told by your doctor. °· Consider wearing a medical alert bracelet. This tells anyone caring for you in an emergency of your condition. °· When traveling, keep your medical information, doctors' names, and the medicines you take with you at all times. °· If you have a fever, do not take fever medicines right away. This could cover up a problem. Tell your doctor. °· Keep all follow-up visits with your doctor. Sickle cell anemia requires regular medical care. °Contact a doctor if: °You have a fever. °Get help right away if: °· You feel dizzy or faint. °· You have new belly (abdominal) pain, especially on the left side near the stomach area. °· You have a lasting, often uncomfortable and painful erection of the penis (priapism). If it is not treated right away, you will become unable to have sex (impotence). °· You have numbness in your arms or legs or you have a hard time moving them. °· You have a hard time talking. °· You have a fever or lasting symptoms for more than 2-3 days. °· You have a fever and your symptoms suddenly get  worse. °· You have signs or symptoms of infection. These include: °? Chills. °? Being more tired than normal (lethargy). °? Irritability. °? Poor eating. °? Throwing up (vomiting). °· You have pain that is not helped with medicine. °· You have shortness of breath. °· You have pain in your chest. °· You are coughing up pus-like or bloody mucus. °· You have a stiff neck. °· Your feet or hands swell or have pain. °· Your belly looks bloated. °· Your joints hurt. °This information is not intended to replace advice given to you by your health care provider. Make sure you discuss any questions you have with your health care provider. °Document Released: 11/21/2012 Document Revised: 07/09/2015 Document Reviewed: 09/12/2012 °Elsevier Interactive Patient Education © 2017 Elsevier Inc. ° °

## 2018-01-20 ENCOUNTER — Inpatient Hospital Stay (HOSPITAL_COMMUNITY)
Admission: EM | Admit: 2018-01-20 | Discharge: 2018-01-25 | DRG: 812 | Disposition: A | Discharge status: Home / Self Care | Payer: Medicaid Other | Attending: Internal Medicine | Admitting: Internal Medicine

## 2018-01-20 ENCOUNTER — Other Ambulatory Visit: Payer: Self-pay

## 2018-01-20 ENCOUNTER — Encounter (HOSPITAL_COMMUNITY): Payer: Self-pay | Admitting: Emergency Medicine

## 2018-01-20 DIAGNOSIS — Z888 Allergy status to other drugs, medicaments and biological substances status: Secondary | ICD-10-CM | POA: Diagnosis not present

## 2018-01-20 DIAGNOSIS — Z881 Allergy status to other antibiotic agents status: Secondary | ICD-10-CM | POA: Diagnosis not present

## 2018-01-20 DIAGNOSIS — Z79891 Long term (current) use of opiate analgesic: Secondary | ICD-10-CM

## 2018-01-20 DIAGNOSIS — F329 Major depressive disorder, single episode, unspecified: Secondary | ICD-10-CM | POA: Diagnosis present

## 2018-01-20 DIAGNOSIS — Z86718 Personal history of other venous thrombosis and embolism: Secondary | ICD-10-CM

## 2018-01-20 DIAGNOSIS — Z7982 Long term (current) use of aspirin: Secondary | ICD-10-CM | POA: Diagnosis not present

## 2018-01-20 DIAGNOSIS — F4325 Adjustment disorder with mixed disturbance of emotions and conduct: Secondary | ICD-10-CM | POA: Diagnosis present

## 2018-01-20 DIAGNOSIS — G8929 Other chronic pain: Secondary | ICD-10-CM | POA: Diagnosis not present

## 2018-01-20 DIAGNOSIS — Z9049 Acquired absence of other specified parts of digestive tract: Secondary | ICD-10-CM

## 2018-01-20 DIAGNOSIS — Z7901 Long term (current) use of anticoagulants: Secondary | ICD-10-CM | POA: Diagnosis not present

## 2018-01-20 DIAGNOSIS — Z880 Allergy status to penicillin: Secondary | ICD-10-CM

## 2018-01-20 DIAGNOSIS — M545 Low back pain, unspecified: Secondary | ICD-10-CM

## 2018-01-20 DIAGNOSIS — Z79899 Other long term (current) drug therapy: Secondary | ICD-10-CM

## 2018-01-20 DIAGNOSIS — Z885 Allergy status to narcotic agent status: Secondary | ICD-10-CM

## 2018-01-20 DIAGNOSIS — Z832 Family history of diseases of the blood and blood-forming organs and certain disorders involving the immune mechanism: Secondary | ICD-10-CM

## 2018-01-20 DIAGNOSIS — G894 Chronic pain syndrome: Secondary | ICD-10-CM | POA: Diagnosis present

## 2018-01-20 DIAGNOSIS — E559 Vitamin D deficiency, unspecified: Secondary | ICD-10-CM | POA: Diagnosis present

## 2018-01-20 DIAGNOSIS — F419 Anxiety disorder, unspecified: Secondary | ICD-10-CM | POA: Diagnosis present

## 2018-01-20 DIAGNOSIS — D5700 Hb-SS disease with crisis, unspecified: Principal | ICD-10-CM | POA: Diagnosis present

## 2018-01-20 DIAGNOSIS — D638 Anemia in other chronic diseases classified elsewhere: Secondary | ICD-10-CM | POA: Diagnosis not present

## 2018-01-20 DIAGNOSIS — D649 Anemia, unspecified: Secondary | ICD-10-CM

## 2018-01-20 LAB — CBC WITH DIFFERENTIAL/PLATELET
Abs Immature Granulocytes: 0.09 10*3/uL — ABNORMAL HIGH (ref 0.00–0.07)
Basophils Absolute: 0.1 10*3/uL (ref 0.0–0.1)
Basophils Relative: 0 %
Eosinophils Absolute: 0.3 10*3/uL (ref 0.0–0.5)
Eosinophils Relative: 2 %
HCT: 31.2 % — ABNORMAL LOW (ref 39.0–52.0)
Hemoglobin: 10.6 g/dL — ABNORMAL LOW (ref 13.0–17.0)
Immature Granulocytes: 1 %
Lymphocytes Relative: 24 %
Lymphs Abs: 3.4 10*3/uL (ref 0.7–4.0)
MCH: 29.4 pg (ref 26.0–34.0)
MCHC: 34 g/dL (ref 30.0–36.0)
MCV: 86.4 fL (ref 80.0–100.0)
Monocytes Absolute: 1 10*3/uL (ref 0.1–1.0)
Monocytes Relative: 7 %
Neutro Abs: 9.2 10*3/uL — ABNORMAL HIGH (ref 1.7–7.7)
Neutrophils Relative %: 66 %
Platelets: 430 10*3/uL — ABNORMAL HIGH (ref 150–400)
RBC: 3.61 MIL/uL — ABNORMAL LOW (ref 4.22–5.81)
RDW: 20.2 % — ABNORMAL HIGH (ref 11.5–15.5)
WBC: 14.1 10*3/uL — ABNORMAL HIGH (ref 4.0–10.5)
nRBC: 0.6 % — ABNORMAL HIGH (ref 0.0–0.2)

## 2018-01-20 LAB — COMPREHENSIVE METABOLIC PANEL
ALT: 12 U/L (ref 0–44)
AST: 30 U/L (ref 15–41)
Albumin: 4.6 g/dL (ref 3.5–5.0)
Alkaline Phosphatase: 75 U/L (ref 38–126)
Anion gap: 12 (ref 5–15)
BUN: 8 mg/dL (ref 6–20)
CO2: 22 mmol/L (ref 22–32)
Calcium: 9.2 mg/dL (ref 8.9–10.3)
Chloride: 104 mmol/L (ref 98–111)
Creatinine, Ser: 0.54 mg/dL — ABNORMAL LOW (ref 0.61–1.24)
GFR calc Af Amer: >60 mL/min (ref >60)
GFR calc non Af Amer: >60 mL/min (ref >60)
Glucose, Bld: 94 mg/dL (ref 70–99)
Potassium: 4.1 mmol/L (ref 3.5–5.1)
Sodium: 138 mmol/L (ref 135–145)
Total Bilirubin: 2.6 mg/dL — ABNORMAL HIGH (ref 0.3–1.2)
Total Protein: 8.1 g/dL (ref 6.5–8.1)

## 2018-01-20 LAB — URINALYSIS, ROUTINE W REFLEX MICROSCOPIC
Bilirubin Urine: NEGATIVE
Glucose, UA: NEGATIVE mg/dL
Hgb urine dipstick: NEGATIVE
Ketones, ur: NEGATIVE mg/dL
Leukocytes, UA: NEGATIVE
Nitrite: NEGATIVE
Protein, ur: NEGATIVE mg/dL
Specific Gravity, Urine: 1.005 (ref 1.005–1.030)
pH: 7 (ref 5.0–8.0)

## 2018-01-20 LAB — RETICULOCYTES
Immature Retic Fract: 37.2 % — ABNORMAL HIGH (ref 2.3–15.9)
RBC.: 3.61 MIL/uL — ABNORMAL LOW (ref 4.22–5.81)
Retic Count, Absolute: 280.1 10*3/uL — ABNORMAL HIGH (ref 19.0–186.0)
Retic Ct Pct: 7.8 % — ABNORMAL HIGH (ref 0.4–3.1)

## 2018-01-20 MED ORDER — HYDROMORPHONE HCL 2 MG/ML IJ SOLN
2.0000 mg | INTRAMUSCULAR | Status: AC
  Administered 2018-01-20: 2 mg via INTRAVENOUS
  Filled 2018-01-20: qty 1

## 2018-01-20 MED ORDER — SODIUM CHLORIDE 0.9 % IV SOLN
25.0000 mg | INTRAVENOUS | Status: DC | PRN
  Filled 2018-01-20: qty 0.5

## 2018-01-20 MED ORDER — NALOXONE HCL 0.4 MG/ML IJ SOLN: 0.4000 mg | INTRAMUSCULAR | Status: DC | PRN

## 2018-01-20 MED ORDER — POLYETHYLENE GLYCOL 3350 17 G PO PACK: 17.0000 g | PACK | Freq: Every day | ORAL | Status: DC | PRN

## 2018-01-20 MED ORDER — HYDROMORPHONE HCL 2 MG/ML IJ SOLN: 2.0000 mg | INTRAMUSCULAR | Status: AC

## 2018-01-20 MED ORDER — ENOXAPARIN SODIUM 40 MG/0.4ML ~~LOC~~ SOLN: 40.0000 mg | SUBCUTANEOUS | Status: DC

## 2018-01-20 MED ORDER — HYDROXYUREA 500 MG PO CAPS
500.0000 mg | ORAL_CAPSULE | Freq: Two times a day (BID) | ORAL | Status: DC
  Administered 2018-01-20 – 2018-01-25 (×10): 500 mg via ORAL
  Filled 2018-01-20 (×11): qty 1

## 2018-01-20 MED ORDER — SENNOSIDES-DOCUSATE SODIUM 8.6-50 MG PO TABS
1.0000 | ORAL_TABLET | Freq: Two times a day (BID) | ORAL | Status: DC
  Administered 2018-01-20 – 2018-01-24 (×4): 1 via ORAL
  Filled 2018-01-20 (×6): qty 1

## 2018-01-20 MED ORDER — OXYCODONE HCL ER 20 MG PO T12A
20.0000 mg | EXTENDED_RELEASE_TABLET | Freq: Two times a day (BID) | ORAL | Status: DC
  Administered 2018-01-20 – 2018-01-25 (×10): 20 mg via ORAL
  Filled 2018-01-20 (×10): qty 1

## 2018-01-20 MED ORDER — HYDROMORPHONE HCL 2 MG/ML IJ SOLN
2.0000 mg | INTRAMUSCULAR | Status: DC
  Filled 2018-01-20: qty 1

## 2018-01-20 MED ORDER — VITAMIN D3 25 MCG (1000 UNIT) PO TABS
2000.0000 [IU] | ORAL_TABLET | Freq: Every day | ORAL | Status: DC
  Administered 2018-01-21 – 2018-01-25 (×5): 2000 [IU] via ORAL
  Filled 2018-01-20 (×6): qty 2

## 2018-01-20 MED ORDER — SODIUM CHLORIDE 0.9% FLUSH: 9.0000 mL | INTRAVENOUS | Status: DC | PRN

## 2018-01-20 MED ORDER — ONDANSETRON HCL 4 MG/2ML IJ SOLN: 4.0000 mg | Freq: Four times a day (QID) | INTRAMUSCULAR | Status: DC | PRN

## 2018-01-20 MED ORDER — HYDROMORPHONE 1 MG/ML IV SOLN
INTRAVENOUS | Status: DC
  Administered 2018-01-20: 3.36 mg via INTRAVENOUS
  Administered 2018-01-20: 07:00:00 via INTRAVENOUS
  Administered 2018-01-20: 2.24 mg via INTRAVENOUS
  Administered 2018-01-20: 8.28 mg via INTRAVENOUS
  Administered 2018-01-20: 1.68 mg via INTRAVENOUS
  Administered 2018-01-21: 0.56 mg via INTRAVENOUS
  Filled 2018-01-20: qty 25

## 2018-01-20 MED ORDER — HYDROMORPHONE HCL 2 MG/ML IJ SOLN: 2.0000 mg | INTRAMUSCULAR | Status: DC

## 2018-01-20 MED ORDER — DIPHENHYDRAMINE HCL 25 MG PO CAPS
25.0000 mg | ORAL_CAPSULE | ORAL | Status: DC | PRN
  Administered 2018-01-20: 25 mg via ORAL
  Filled 2018-01-20: qty 1

## 2018-01-20 MED ORDER — FOLIC ACID 1 MG PO TABS
1.0000 mg | ORAL_TABLET | Freq: Every day | ORAL | Status: DC
  Administered 2018-01-21 – 2018-01-25 (×5): 1 mg via ORAL
  Filled 2018-01-20 (×5): qty 1

## 2018-01-20 MED ORDER — HYDROMORPHONE HCL 2 MG/ML IJ SOLN
2.0000 mg | INTRAMUSCULAR | Status: AC
  Administered 2018-01-20: 2 mg via INTRAVENOUS

## 2018-01-20 MED ORDER — DIPHENHYDRAMINE HCL 25 MG PO CAPS
25.0000 mg | ORAL_CAPSULE | ORAL | Status: DC | PRN
  Administered 2018-01-20 (×2): 25 mg via ORAL
  Filled 2018-01-20 (×2): qty 1

## 2018-01-20 MED ORDER — SODIUM CHLORIDE 0.45 % IV SOLN
INTRAVENOUS | Status: AC
  Administered 2018-01-20 – 2018-01-21 (×2): via INTRAVENOUS

## 2018-01-20 MED ORDER — PROMETHAZINE HCL 25 MG PO TABS
25.0000 mg | ORAL_TABLET | ORAL | Status: DC | PRN
  Administered 2018-01-20 – 2018-01-23 (×2): 25 mg via ORAL
  Filled 2018-01-20 (×2): qty 1

## 2018-01-20 MED ORDER — ASPIRIN EC 81 MG PO TBEC
81.0000 mg | DELAYED_RELEASE_TABLET | Freq: Every day | ORAL | Status: DC
  Administered 2018-01-21 – 2018-01-25 (×5): 81 mg via ORAL
  Filled 2018-01-20 (×5): qty 1

## 2018-01-20 MED ORDER — GABAPENTIN 300 MG PO CAPS
300.0000 mg | ORAL_CAPSULE | Freq: Three times a day (TID) | ORAL | Status: DC
  Administered 2018-01-20 – 2018-01-25 (×14): 300 mg via ORAL
  Filled 2018-01-20 (×15): qty 1

## 2018-01-20 MED ORDER — APIXABAN 5 MG PO TABS
5.0000 mg | ORAL_TABLET | Freq: Two times a day (BID) | ORAL | Status: DC
  Administered 2018-01-20 – 2018-01-25 (×9): 5 mg via ORAL
  Filled 2018-01-20 (×11): qty 1

## 2018-01-20 MED ORDER — SODIUM CHLORIDE 0.45 % IV SOLN
INTRAVENOUS | Status: DC
  Administered 2018-01-20: 03:00:00 via INTRAVENOUS

## 2018-01-20 MED ORDER — HYDROXYZINE HCL 25 MG PO TABS
25.0000 mg | ORAL_TABLET | Freq: Four times a day (QID) | ORAL | Status: DC | PRN
  Administered 2018-01-20 – 2018-01-22 (×2): 25 mg via ORAL
  Filled 2018-01-20 (×2): qty 1

## 2018-01-20 NOTE — ED Triage Notes (Signed)
Pt from home with c/o back pain secondary to sickle cell pain. Pt denies SOb or cp

## 2018-01-20 NOTE — H&P (Signed)
History and Physical    Nolberto Cheuvront GDJ:[id number] DOB: 08-13-1990 DOA: 01/20/2018  PCP: Lanae Boast, FNP  Patient coming from: Home.  Chief Complaint: Low back pain.  HPI: Kayon Dozier is a 27 y.o. male with history of sickle cell anemia presents to the ER with worsening pain in the low back since last night.  Denies any chest pain shortness of breath any weakness of the lower extremities or any difficulty with urination.  ED Course: In the ER despite giving multiple doses of pain relief medication patient still has pain and has been admitted for sickle cell pain crisis.  Review of Systems: As per HPI, rest all negative.   Past Medical History:  Diagnosis Date  . Depression   . 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, left shoulder ([place]) y-3   associated with Hb SS  . Sickle cell anemia ([facility])   . Vitamin D deficiency y-6    Past Surgical History:  Procedure Laterality Date  . CHOLECYSTECTOMY       reports that he has never smoked. He has never used smokeless tobacco. He reports that he does not drink alcohol or use drugs.  Allergies  Allergen Reactions  . Buprenorphine Hcl Hives  . Levaquin [Levofloxacin] Itching  . Meperidine Rash  . Morphine And Related Hives  . Toradol [Ketorolac Tromethamine] Itching  . Tramadol Hives  . Vancomycin Itching  . Zosyn [Piperacillin Sod-Tazobactam So] Itching and Rash    Has taken rocephin in past    Family History  Problem Relation Age of Onset  . Sickle cell trait Mother   . Sickle cell trait Father     Prior to Admission medications   Medication Sig Start Date End Date Taking? Authorizing Provider  aspirin EC 81 MG tablet Take 1 tablet (81 mg total) by mouth daily. 06/02/17   Dorena Dew, FNP  cholecalciferol (VITAMIN D) 1000 units tablet Take 2 tablets (2,000 Units total) by mouth daily. 12/13/17   Azzie Glatter, FNP  ELIQUIS STARTER PACK (ELIQUIS STARTER PACK) 5 MG TABS Take as directed on  package: start with two-5mg  tablets twice daily for 7 days. On day 8, switch to one-5mg  tablet twice daily. 12/13/17   Azzie Glatter, FNP  folic acid (FOLVITE) 1 MG tablet Take 1 tablet (1 mg total) by mouth daily. 05/01/17   Dorena Dew, FNP  gabapentin (NEURONTIN) 300 MG capsule Take 1 capsule (300 mg total) by mouth 3 (three) times daily. For 30 days. 01/16/18 02/15/18  Lanae Boast, FNP  hydroxyurea (HYDREA) 500 MG capsule Take 1 capsule (500 mg total) by mouth 2 (two) times daily. May take with food to minimize GI side effects. 05/01/17   Dorena Dew, FNP  ibuprofen (ADVIL,MOTRIN) 800 MG tablet Take 1 tablet (800 mg total) by mouth 3 (three) times daily as needed (pain). 12/20/17   Azzie Glatter, FNP  oxyCODONE (OXYCONTIN) 20 mg 12 hr tablet Take 1 tablet (20 mg total) by mouth every 12 (twelve) hours. 01/05/18 02/04/18  Lanae Boast, FNP  oxyCODONE-acetaminophen (PERCOCET) 10-325 MG tablet Take 1 tablet by mouth every 6 (six) hours as needed for up to 15 days for pain. 01/15/18 01/30/18  Lanae Boast, FNP    Physical Exam: Vitals:   01/20/18 0137 01/20/18 0255 01/20/18 0400  BP: 115/81 101/70 96/69  Pulse: 87 78 77  Resp: 16 16 20   Temp: 98.7 F (37.1 C)    TempSrc: Oral    SpO2: 99% 98% 100%  Constitutional: Moderately built and nourished. Vitals:   01/20/18 0137 01/20/18 0255 01/20/18 0400  BP: 115/81 101/70 96/69  Pulse: 87 78 77  Resp: 16 16 20   Temp: 98.7 F (37.1 C)    TempSrc: Oral    SpO2: 99% 98% 100%   Eyes: Anicteric no pallor. ENMT: No discharge from the ears eyes nose or mouth. Neck: No mass felt.  No neck rigidity. Respiratory: No rhonchi or crepitations. Cardiovascular: S1-S2 heard. Abdomen: Soft nontender bowel sounds present. Musculoskeletal: No edema.  No joint effusion. Skin: No rash. Neurologic: Alert awake oriented to time place and person.  Moves all extremities. Psychiatric: Appears normal per normal affect.   Labs on  Admission: I have personally reviewed following labs and imaging studies  CBC: Recent Labs  Lab 01/20/18 0202  WBC 14.1*  NEUTROABS 9.2*  HGB 10.6*  HCT 31.2*  MCV 86.4  PLT 259*   Basic Metabolic Panel: Recent Labs  Lab 01/20/18 0202  NA 138  K 4.1  CL 104  CO2 22  GLUCOSE 94  BUN 8  CREATININE 0.54*  CALCIUM 9.2   GFR: Estimated Creatinine Clearance: 108.3 mL/min (A) (by C-G formula based on SCr of 0.54 mg/dL (L)). Liver Function Tests: Recent Labs  Lab 01/20/18 0202  AST 30  ALT 12  ALKPHOS 75  BILITOT 2.6*  PROT 8.1  ALBUMIN 4.6   No results for input(s): LIPASE, AMYLASE in the last 168 hours. No results for input(s): AMMONIA in the last 168 hours. Coagulation Profile: No results for input(s): INR, PROTIME in the last 168 hours. Cardiac Enzymes: No results for input(s): CKTOTAL, CKMB, CKMBINDEX, TROPONINI in the last 168 hours. BNP (last 3 results) No results for input(s): PROBNP in the last 8760 hours. HbA1C: No results for input(s): HGBA1C in the last 72 hours. CBG: No results for input(s): GLUCAP in the last 168 hours. Lipid Profile: No results for input(s): CHOL, HDL, LDLCALC, TRIG, CHOLHDL, LDLDIRECT in the last 72 hours. Thyroid Function Tests: No results for input(s): TSH, T4TOTAL, FREET4, T3FREE, THYROIDAB in the last 72 hours. Anemia Panel: Recent Labs    01/20/18 0202  RETICCTPCT 7.8*   Urine analysis:    Component Value Date/Time   COLORURINE YELLOW 10/15/2017 1241   APPEARANCEUR CLEAR 10/15/2017 1241   LABSPEC 1.004 (L) 10/15/2017 1241   PHURINE 7.0 10/15/2017 1241   GLUCOSEU NEGATIVE 10/15/2017 1241   HGBUR MODERATE (A) 10/15/2017 1241   BILIRUBINUR small 01/18/2018 1145   KETONESUR negative 12/13/2017 1404   KETONESUR NEGATIVE 10/15/2017 1241   PROTEINUR Positive (A) 01/18/2018 1145   PROTEINUR NEGATIVE 10/15/2017 1241   UROBILINOGEN >=8.0 (A) 01/18/2018 1145   UROBILINOGEN 2.0 (H) 05/01/2017 0955   NITRITE neg 01/18/2018  1145   NITRITE NEGATIVE 10/15/2017 1241   LEUKOCYTESUR Negative 01/18/2018 1145   Sepsis Labs: @LABRCNTIP (procalcitonin:4,lacticidven:4) )No results found for this or any previous visit (from the past 240 hour(s)).   Radiological Exams on Admission: No results found.   Assessment/Plan Principal Problem:   Sickle cell pain crisis ([place])    1. Sickle cell pain crisis -placed on weight-based Dilaudid PCA continue patient's home medications. 2. History of right upper extremity DVT on apixaban.  Confirmed with patient that patient is taking the medication. 3. Leukocytosis likely reactionary patient is afebrile.  UA is pending. 4. Anemia secondary to sickle cell disease follow CBC.   DVT prophylaxis: Apixaban. Code Status: Full code. Family Communication: Discussed with patient. Disposition Plan: Home. Consults called: None. Admission status: Inpatient.  Rise Patience MD [facility] Pager [phone].  If 7PM-7AM, please contact night-coverage [email or web address] Password [facility]  01/20/2018, 5:26 AM

## 2018-01-20 NOTE — ED Notes (Signed)
ED TO INPATIENT HANDOFF REPORT  Name/Age/Gender Timothy Marks 27 y.o. male  Code Status    Code Status Orders  (From admission, onward)         Start     Ordered   01/20/18 0524  Full code  Continuous     01/20/18 0525        Code Status History    Date Active Date Inactive Code Status Order ID Comments User Context   10/15/2017 1443 10/25/2017 2010 Full Code [id number]  Neva Seat, MD ED   10/15/2017 1413 10/15/2017 1443 Full Code [id number]  Neva Seat, MD ED   07/16/2017 2232 07/18/2017 2148 Full Code [id number]  Jani Gravel, MD ED   03/29/2017 2057 04/05/2017 2057 Full Code [id number]  Elodia Florence., MD Inpatient   03/28/2017 0728 03/29/2017 0855 Full Code [id number]  Radene Gunning, NP ED   08/01/2014 0314 08/11/2014 2118 Full Code [id number]  Ivor Costa, MD ED   07/01/2014 0104 07/05/2014 1853 Full Code [id number]  Ivor Costa, MD Inpatient   05/06/2014 0548 05/11/2014 1950 Full Code [id number]  Lavina Hamman, MD Inpatient   03/30/2014 1202 04/09/2014 1929 Full Code [id number]  Bonnielee Haff, MD Inpatient   02/07/2014 1503 02/12/2014 1546 Full Code [id number]  Francesca Oman, DO Inpatient   03/03/2013 1619 03/06/2013 1730 Full Code [id number]  Olam Idler, MD Inpatient   01/24/2013 1614 01/25/2013 1222 Full Code [id number]  Hosie Poisson, MD Inpatient      Home/SNF/Other Home  Chief Complaint Sickle cell pain crisis  Level of Care/Admitting Diagnosis ED Disposition    ED Disposition Condition [facility] Area: [facility] [[id number]]  Level of Care: Med-Surg [16]  Diagnosis: Sickle cell pain crisis [facility]) [[id number]]  Admitting Physician: Rise Patience [phone]  Attending Physician: Rise Patience [phone]  Estimated length of stay: past midnight tomorrow  Certification:: I certify this patient will need inpatient services for at least 2 midnights  PT Class (Do Not Modify): Inpatient [101]  PT Acc Code (Do Not Modify): Private  [1]       Medical History Past Medical History:  Diagnosis Date  . Depression   . 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 Sickle cell anemia (HCC)   . Vitamin D deficiency y-6    Allergies Allergies  Allergen Reactions  . Buprenorphine Hcl Hives  . Levaquin [Levofloxacin] Itching  . Meperidine Rash  . Morphine And Related Hives  . Toradol [Ketorolac Tromethamine] Itching  . Tramadol Hives  . Vancomycin Itching  . Zosyn [Piperacillin Sod-Tazobactam So] Itching and Rash    Has taken rocephin in past    IV Location/Drains/Wounds Patient Lines/Drains/Airways Status   Active Line/Drains/Airways    Name:   Placement date:   Placement time:   Site:   Days:   Peripheral IV 01/20/18 Left Wrist   01/20/18    0227    Wrist   less than 1          Labs/Imaging Results for orders placed or performed during the hospital encounter of 01/20/18 (from the past 48 hour(s))  CBC WITH DIFFERENTIAL     Status: Abnormal   Collection Time: 01/20/18  2:02 AM  Result Value Ref Range   WBC 14.1 (H) 4.0 - 10.5 K/uL   RBC 3.61 (L) 4.22 - 5.81 MIL/uL   Hemoglobin 10.6 (L) 13.0 - 17.0 g/dL   HCT  31.2 (L) 39.0 - 52.0 %   MCV 86.4 80.0 - 100.0 fL   MCH 29.4 26.0 - 34.0 pg   MCHC 34.0 30.0 - 36.0 g/dL   RDW 20.2 (H) 11.5 - 15.5 %   Platelets 430 (H) 150 - 400 K/uL   nRBC 0.6 (H) 0.0 - 0.2 %   Neutrophils Relative % 66 %   Neutro Abs 9.2 (H) 1.7 - 7.7 K/uL   Lymphocytes Relative 24 %   Lymphs Abs 3.4 0.7 - 4.0 K/uL   Monocytes Relative 7 %   Monocytes Absolute 1.0 0.1 - 1.0 K/uL   Eosinophils Relative 2 %   Eosinophils Absolute 0.3 0.0 - 0.5 K/uL   Basophils Relative 0 %   Basophils Absolute 0.1 0.0 - 0.1 K/uL   Immature Granulocytes 1 %   Abs Immature Granulocytes 0.09 (H) 0.00 - 0.07 K/uL    Comment: Performed at [facility], [place] [address], [place], [place] [address]  Reticulocytes     Status: Abnormal    Collection Time: 01/20/18  2:02 AM  Result Value Ref Range   Retic Ct Pct 7.8 (H) 0.4 - 3.1 %   RBC. 3.61 (L) 4.22 - 5.81 MIL/uL   Retic Count, Absolute 280.1 (H) 19.0 - 186.0 K/uL   Immature Retic Fract 37.2 (H) 2.3 - 15.9 %    Comment: Performed at [facility], [place] [address] [place], [place] [address]  Comprehensive metabolic panel     Status: Abnormal   Collection Time: 01/20/18  2:02 AM  Result Value Ref Range   Sodium 138 135 - 145 mmol/L   Potassium 4.1 3.5 - 5.1 mmol/L   Chloride 104 98 - 111 mmol/L   CO2 22 22 - 32 mmol/L   Glucose, Bld 94 70 - 99 mg/dL   BUN 8 6 - 20 mg/dL   Creatinine, Ser 0.54 (L) 0.61 - 1.24 mg/dL   Calcium 9.2 8.9 - 10.3 mg/dL   Total Protein 8.1 6.5 - 8.1 g/dL   Albumin 4.6 3.5 - 5.0 g/dL   AST 30 15 - 41 U/L   ALT 12 0 - 44 U/L   Alkaline Phosphatase 75 38 - 126 U/L   Total Bilirubin 2.6 (H) 0.3 - 1.2 mg/dL   GFR calc non Af Amer >60 >60 mL/min   GFR calc Af Amer >60 >60 mL/min   Anion gap 12 5 - 15    Comment: Performed at [facility], [place] [address] [place], [place] [address]   No results found. None  Pending Labs Unresulted Labs (From admission, onward)    Start     Ordered   01/21/18 0500  CBC with Differential/Platelet  Tomorrow morning,   R     01/20/18 0525   01/21/18 0500  Lactate dehydrogenase  Tomorrow morning,   R     01/20/18 0525   01/20/18 0202  Urinalysis, Routine w reflex microscopic  ONCE - STAT,   STAT     01/20/18 0203          Vitals/Pain Today's Vitals   01/20/18 1230 01/20/18 1238 01/20/18 1300 01/20/18 1306  BP: 128/71 128/71 129/64 129/64  Pulse:  78  78  Resp: 20 20 14 14   Temp:      TempSrc:      SpO2:  99%  99%  PainSc:        Isolation Precautions No active isolations  Medications Medications  promethazine (PHENERGAN) tablet 25  mg (25 mg Oral Given 01/20/18 0247)  aspirin EC tablet 81 mg (has no administration in time range)  oxyCODONE (OXYCONTIN) 12  hr tablet 20 mg (has no administration in time range)  hydroxyurea (HYDREA) capsule 500 mg (has no administration in time range)  folic acid (FOLVITE) tablet 1 mg (has no administration in time range)  gabapentin (NEURONTIN) capsule 300 mg (has no administration in time range)  cholecalciferol (VITAMIN D) tablet 2,000 Units (has no administration in time range)  senna-docusate (Senokot-S) tablet 1 tablet (has no administration in time range)  polyethylene glycol (MIRALAX / GLYCOLAX) packet 17 g (has no administration in time range)  0.45 % sodium chloride infusion ( Intravenous Restarted 01/20/18 0618)  naloxone (NARCAN) injection 0.4 mg (has no administration in time range)    And  sodium chloride flush (NS) 0.9 % injection 9 mL (has no administration in time range)  ondansetron (ZOFRAN) injection 4 mg (has no administration in time range)  diphenhydrAMINE (BENADRYL) capsule 25 mg (has no administration in time range)    Or  diphenhydrAMINE (BENADRYL) 25 mg in sodium chloride 0.9 % 50 mL IVPB (has no administration in time range)  HYDROmorphone (DILAUDID) 1 mg/mL PCA injection ( Intravenous Set-up / Initial Syringe 01/20/18 0710)  apixaban (ELIQUIS) tablet 5 mg (has no administration in time range)  HYDROmorphone (DILAUDID) injection 2 mg (2 mg Intravenous Given 01/20/18 0251)    Or  HYDROmorphone (DILAUDID) injection 2 mg ( Subcutaneous See Alternative 01/20/18 0251)  HYDROmorphone (DILAUDID) injection 2 mg (2 mg Intravenous Given 01/20/18 0431)    Or  HYDROmorphone (DILAUDID) injection 2 mg ( Subcutaneous See Alternative 01/20/18 0431)  HYDROmorphone (DILAUDID) injection 2 mg (2 mg Intravenous Given 01/20/18 0347)    Or  HYDROmorphone (DILAUDID) injection 2 mg ( Subcutaneous See Alternative 01/20/18 0347)    Mobility walks

## 2018-01-20 NOTE — Progress Notes (Signed)
Report recieved from [facility] in ED.

## 2018-01-20 NOTE — ED Provider Notes (Signed)
[facility] DEPT Provider Note   CSN: [id number] Arrival date & time: 01/20/18  0037     History   Chief Complaint Chief Complaint  Patient presents with  . Sickle Cell Pain Crisis    HPI Timothy Marks is a 27 y.o. male with a hx of sickle cell disease, osteonecrosis of the left shoulder, depression presents to the Emergency Department complaining of gradual, persistent, progressively worsening low back pain onset 2 days ago.  Patient states this is typical of his sickle cell pain.  He reports taking oxycodone at home without relief.  Patient denies radiation of the pain, reports it is in his lower back and is sharp and stabbing.  No specific aggravating or alleviating factors.  He denies numbness, weakness, gait disturbance, loss of bowel or bladder control.  Patient also denies falls or known trauma.  He does not take an anticoagulant.  She denies fever, chills, headache, neck pain, chest pain, shortness of breath, abdominal pain, nausea, vomiting, diarrhea, weakness, dizziness, syncope.  Patient denies all urinary symptoms including dysuria, hematuria, penile discharge and testicular pain.  The history is provided by the patient and medical records. No language interpreter was used.    Past Medical History:  Diagnosis Date  . Depression   . 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 Sickle cell anemia (HCC)   . Vitamin D deficiency y-6    Patient Active Problem List   Diagnosis Date Noted  . 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 Physical deconditioning   . Agitation   . Chronic pain syndrome   . Recurrent cold sores   . Fever   . Pain   . Sickle cell crisis ([place]) 07/16/2017  . Adjustment disorder with disturbance of conduct   . Constipation 08/03/2014  . 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 Protein-calorie malnutrition, severe ([place]) 04/09/2014  . Major depressive disorder, recurrent, severe without  psychotic features ([place])   . 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 Vitamin D deficiency   . Sickle cell anemia ([place]) 03/30/2014  . Hyperbilirubinemia 03/30/2014  . Hypokalemia 02/07/2014  . Aggressive behavior 01/25/2013  . Sickle cell pain crisis ([place]) 01/24/2013    Past Surgical History:  Procedure Laterality Date  . CHOLECYSTECTOMY          Home Medications    Prior to Admission medications   Medication Sig Start Date End Date Taking? Authorizing Provider  aspirin EC 81 MG tablet Take 1 tablet (81 mg total) by mouth daily. 06/02/17   Dorena Dew, FNP  cholecalciferol (VITAMIN D) 1000 units tablet Take 2 tablets (2,000 Units total) by mouth daily. 12/13/17   Azzie Glatter, FNP  ELIQUIS STARTER PACK (ELIQUIS STARTER PACK) 5 MG TABS Take as directed on package: start with two-5mg  tablets twice daily for 7 days. On day 8, switch to one-5mg  tablet twice daily. 12/13/17   Azzie Glatter, FNP  folic acid (FOLVITE) 1 MG tablet Take 1 tablet (1 mg total) by mouth daily. 05/01/17   Dorena Dew, FNP  gabapentin (NEURONTIN) 300 MG capsule Take 1 capsule (300 mg total) by mouth 3 (three) times daily. For 30 days. 01/16/18 02/15/18  Lanae Boast, FNP  hydroxyurea (HYDREA) 500 MG capsule Take 1 capsule (500 mg total) by mouth 2 (two) times daily. May take with food to minimize GI side effects. 05/01/17   Dorena Dew, FNP  ibuprofen (ADVIL,MOTRIN) 800  MG tablet Take 1 tablet (800 mg total) by mouth 3 (three) times daily as needed (pain). 12/20/17   Azzie Glatter, FNP  oxyCODONE (OXYCONTIN) 20 mg 12 hr tablet Take 1 tablet (20 mg total) by mouth every 12 (twelve) hours. 01/05/18 02/04/18  Lanae Boast, FNP  oxyCODONE-acetaminophen (PERCOCET) 10-325 MG tablet Take 1 tablet by mouth every 6 (six) hours as needed for up to 15 days for pain. 01/15/18 01/30/18  Lanae Boast, FNP    Family History Family History  Problem Relation Age of  Onset  . Sickle cell trait Mother   . Sickle cell trait Father     Social History Social History   Tobacco Use  . Smoking status: Never Smoker  . Smokeless tobacco: Never Used  Substance Use Topics  . Alcohol use: No  . Drug use: No    Types: Marijuana    Comment: denies use 05/01/2017     Allergies   Buprenorphine hcl; Levaquin [levofloxacin]; Meperidine; Morphine and related; Toradol [ketorolac tromethamine]; Tramadol; Vancomycin; and Zosyn [piperacillin sod-tazobactam so]   Review of Systems Review of Systems  Constitutional: Negative for appetite change, diaphoresis, fatigue, fever and unexpected weight change.  HENT: Negative for mouth sores.   Eyes: Negative for visual disturbance.  Respiratory: Negative for cough, chest tightness, shortness of breath and wheezing.   Cardiovascular: Negative for chest pain.  Gastrointestinal: Negative for abdominal pain, constipation, diarrhea, nausea and vomiting.  Endocrine: Negative for polydipsia, polyphagia and polyuria.  Genitourinary: Negative for dysuria, frequency, hematuria and urgency.  Musculoskeletal: Positive for back pain. Negative for neck stiffness.  Skin: Negative for rash.  Allergic/Immunologic: Negative for immunocompromised state.  Neurological: Negative for syncope, light-headedness and headaches.  Hematological: Does not bruise/bleed easily.  Psychiatric/Behavioral: Negative for sleep disturbance. The patient is not nervous/anxious.      Physical Exam Updated Vital Signs BP 115/81 (BP Location: Left Arm)   Pulse 87   Temp 98.7 F (37.1 C) (Oral)   Resp 16   SpO2 99%   Physical Exam  Constitutional: He is oriented to person, place, and time. He appears well-developed and well-nourished. No distress.  Awake, alert, nontoxic appearing, NAD  HENT:  Head: Normocephalic and atraumatic.  Mouth/Throat: Oropharynx is clear and moist.  Eyes: Conjunctivae are normal. No scleral icterus.  Neck: Normal range of  motion. Neck supple.  Cardiovascular: Normal rate, regular rhythm and intact distal pulses.  Pulses:      Radial pulses are 2+ on the right side, and 2+ on the left side.       Dorsalis pedis pulses are 2+ on the right side, and 2+ on the left side.       Posterior tibial pulses are 2+ on the right side, and 2+ on the left side.  Capillary refill < 3 sec  Pulmonary/Chest: Effort normal and breath sounds normal. No accessory muscle usage. No tachypnea. No respiratory distress. He has no decreased breath sounds. He has no wheezes. He has no rhonchi. He has no rales. He exhibits no bony tenderness.  Abdominal: Soft. Bowel sounds are normal. He exhibits no distension. There is no tenderness. There is no guarding and no CVA tenderness.  Musculoskeletal: Normal range of motion. He exhibits no tenderness.  Full ROM of all major joints No erythema, tenderness or swelling of any major joint Generalized tenderness across the low back.  Lymphadenopathy:    He has no cervical adenopathy.  Neurological: He is alert and oriented to person, place, and time.  Skin: Skin is warm and dry. He is not diaphoretic. No erythema.  Psychiatric: He has a normal mood and affect. His behavior is normal.  Nursing note and vitals reviewed.    ED Treatments / Results  Labs (all labs ordered are listed, but only abnormal results are displayed) Labs Reviewed  CBC WITH DIFFERENTIAL/PLATELET - Abnormal; Notable for the following components:      Result Value   WBC 14.1 (*)    RBC 3.61 (*)    Hemoglobin 10.6 (*)    HCT 31.2 (*)    RDW 20.2 (*)    Platelets 430 (*)    nRBC 0.6 (*)    Neutro Abs 9.2 (*)    Abs Immature Granulocytes 0.09 (*)    All other components within normal limits  RETICULOCYTES - Abnormal; Notable for the following components:   Retic Ct Pct 7.8 (*)    RBC. 3.61 (*)    Retic Count, Absolute 280.1 (*)    Immature Retic Fract 37.2 (*)    All other components within normal limits    COMPREHENSIVE METABOLIC PANEL - Abnormal; Notable for the following components:   Creatinine, Ser 0.54 (*)    Total Bilirubin 2.6 (*)    All other components within normal limits  URINALYSIS, ROUTINE W REFLEX MICROSCOPIC  CBC  CREATININE, SERUM    EKG None  Radiology No results found.  Procedures Procedures (including critical care time)  Medications Ordered in ED Medications  promethazine (PHENERGAN) tablet 25 mg (25 mg Oral Given 01/20/18 0247)  aspirin EC tablet 81 mg (has no administration in time range)  oxyCODONE (OXYCONTIN) 12 hr tablet 20 mg (has no administration in time range)  hydroxyurea (HYDREA) capsule 500 mg (has no administration in time range)  folic acid (FOLVITE) tablet 1 mg (has no administration in time range)  gabapentin (NEURONTIN) capsule 300 mg (has no administration in time range)  cholecalciferol (VITAMIN D) tablet 2,000 Units (has no administration in time range)  senna-docusate (Senokot-S) tablet 1 tablet (has no administration in time range)  polyethylene glycol (MIRALAX / GLYCOLAX) packet 17 g (has no administration in time range)  enoxaparin (LOVENOX) injection 40 mg (has no administration in time range)  0.45 % sodium chloride infusion (has no administration in time range)  HYDROmorphone (DILAUDID) injection 2 mg (2 mg Intravenous Given 01/20/18 0251)    Or  HYDROmorphone (DILAUDID) injection 2 mg ( Subcutaneous See Alternative 01/20/18 0251)  HYDROmorphone (DILAUDID) injection 2 mg (2 mg Intravenous Given 01/20/18 0431)    Or  HYDROmorphone (DILAUDID) injection 2 mg ( Subcutaneous See Alternative 01/20/18 0431)  HYDROmorphone (DILAUDID) injection 2 mg (2 mg Intravenous Given 01/20/18 0347)    Or  HYDROmorphone (DILAUDID) injection 2 mg ( Subcutaneous See Alternative 01/20/18 0347)     Initial Impression / Assessment and Plan / ED Course  I have reviewed the triage vital signs and the nursing notes.  Pertinent labs & imaging results that were  available during my care of the patient were reviewed by me and considered in my medical decision making (see chart for details).     Patient with history of sickle cell disease presents today for sickle cell pain crisis.  He reports pain is the same today as it has been previously.  His vital signs are stable and he is without fever.  Labs show leukocytosis above patient's baseline.  Additionally he has anemia with a hemoglobin of 10.6.  Previous values have ranged from 7.0-12.5.  Patient's reticulocyte  count is 7.8 and his immature reticulocyte count is 37.2%.  5:26 AM Urinalysis pending however patient denies urinary symptoms.  He continues to have severe low back pain.  No radiation of his pain.  Vitals remain within normal limits.  He has had minimal relief from his pain control, he will need admission for additional pain control.  Final Clinical Impressions(s) / ED Diagnoses   Final diagnoses:  Sickle cell pain crisis ([place])  Acute bilateral low back pain without sciatica    ED Discharge Orders    None       Loni Muse Gwenlyn Perking 01/20/18 0526    Fatima Blank, MD 01/20/18 [phone]

## 2018-01-21 MED ORDER — LORAZEPAM 2 MG/ML IJ SOLN
1.0000 mg | INTRAMUSCULAR | Status: DC | PRN
  Administered 2018-01-21 – 2018-01-25 (×9): 1 mg via INTRAVENOUS
  Filled 2018-01-21 (×9): qty 1

## 2018-01-21 MED ORDER — BENZOCAINE 10 % MT GEL
Freq: Four times a day (QID) | OROMUCOSAL | Status: DC | PRN
  Administered 2018-01-21: via OROMUCOSAL
  Filled 2018-01-21: qty 9.4

## 2018-01-21 MED ORDER — HYDROMORPHONE HCL 2 MG/ML IJ SOLN
2.0000 mg | Freq: Once | INTRAMUSCULAR | Status: AC
  Administered 2018-01-21: 2 mg via INTRAVENOUS
  Filled 2018-01-21: qty 1

## 2018-01-21 MED ORDER — HYDROMORPHONE HCL 1 MG/ML IJ SOLN
1.0000 mg | INTRAMUSCULAR | Status: DC | PRN
  Administered 2018-01-21 – 2018-01-22 (×6): 1 mg via INTRAVENOUS
  Filled 2018-01-21 (×6): qty 1

## 2018-01-21 NOTE — Progress Notes (Signed)
This Agricultural consultant spoke with patient regarding reports that patient has been using vulgar and profane language when communicating with staff via intercom.  This RN reinforced the expectation that staff be treated with respect, and the use of profanity is unacceptable.
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
## 2018-01-21 NOTE — Progress Notes (Signed)
Subjective: Patient is a 27 year old gentleman admitted yesterday with sickle cell painful crisis and behavioral changes.  He has been belligerent all night long according to nurses.  Patient not staying on his PCA.  Finally his PCA has been discontinued and currently on Dilaudid pushes.  He is sleeping soundly today and even when awake appears calm after receiving Ativan.  No fever or chills no nausea vomiting or diarrhea.  Objective: Vital signs in last 24 hours: Temp:  [97.9 F (36.6 C)-98.8 F (37.1 C)] 98.8 F (37.1 C) (12/08 0930) Pulse Rate:  [65-84] 76 (12/08 0930) Resp:  [11-19] 16 (12/08 0930) BP: (102-136)/(57-82) 102/57 (12/08 0930) SpO2:  [92 %-97 %] 97 % (12/08 0930) Weight change:  Last BM Date: 01/18/18  Intake/Output from previous day: 12/07 0701 - 12/08 0700 In: 2043.8 [I.V.:2043.8] Out: 1400 [Urine:1400] Intake/Output this shift: No intake/output data recorded.  General appearance: fatigued and no distress Head: Normocephalic, without obvious abnormality, atraumatic Eyes: conjunctivae/corneas clear. PERRL, EOM's intact. Fundi benign. Neck: no adenopathy, no carotid bruit, no JVD, supple, symmetrical, trachea midline and thyroid not enlarged, symmetric, no tenderness/mass/nodules Back: symmetric, no curvature. ROM normal. No CVA tenderness. Resp: clear to auscultation bilaterally Cardio: regular rate and rhythm, S1, S2 normal, no murmur, click, rub or gallop GI: soft, non-tender; bowel sounds normal; no masses,  no organomegaly Extremities: extremities normal, atraumatic, no cyanosis or edema Pulses: 2+ and symmetric Skin: Skin color, texture, turgor normal. No rashes or lesions Neurologic: Grossly normal  Lab Results: Recent Labs    01/20/18 0202  WBC 14.1*  HGB 10.6*  HCT 31.2*  PLT 430*   BMET Recent Labs    01/20/18 0202  NA 138  K 4.1  CL 104  CO2 22  GLUCOSE 94  BUN 8  CREATININE 0.54*  CALCIUM 9.2    Studies/Results: No results  found.  Medications: I have reviewed the patient's current medications.  Assessment/Plan: A 27 year old gentleman admitted with sickle cell painful crisis.  #1 sickle cell painful crisis: patient will be maintained on IV Dilaudid as needed.  I will restart his home oral medications also.  He appears not to conform with the PCA as of this point.  If pain remains stable like this will discharge him tomorrow.  #2 leukocytosis: Secondary to vaso-occlusive crisis.  He appears to be doing better.  Monitor closely.  #3 anxiety disorder with behavioral changes: Patient is responding to Ativan adequately.  He could have been withdrawn from other medications.  Continue Ativan as needed.  #4 history of right upper extremity DVT: Continue apixaban.  #5 anemia of chronic disease: H&H is stable at baseline.   LOS: 1 day   Lecil Tapp,LAWAL 01/21/2018, 1:11 PM

## 2018-01-22 DIAGNOSIS — G8929 Other chronic pain: Secondary | ICD-10-CM

## 2018-01-22 DIAGNOSIS — D638 Anemia in other chronic diseases classified elsewhere: Secondary | ICD-10-CM

## 2018-01-22 DIAGNOSIS — D5700 Hb-SS disease with crisis, unspecified: Principal | ICD-10-CM

## 2018-01-22 DIAGNOSIS — F4325 Adjustment disorder with mixed disturbance of emotions and conduct: Secondary | ICD-10-CM

## 2018-01-22 DIAGNOSIS — D649 Anemia, unspecified: Secondary | ICD-10-CM

## 2018-01-22 MED ORDER — OXYCODONE HCL 5 MG PO TABS
10.0000 mg | ORAL_TABLET | ORAL | Status: DC
  Administered 2018-01-22 – 2018-01-25 (×16): 10 mg via ORAL
  Filled 2018-01-22 (×17): qty 2

## 2018-01-22 MED ORDER — HYDROMORPHONE HCL 1 MG/ML IJ SOLN
1.0000 mg | Freq: Four times a day (QID) | INTRAMUSCULAR | Status: DC | PRN
  Administered 2018-01-22 – 2018-01-25 (×10): 1 mg via INTRAVENOUS
  Filled 2018-01-22 (×10): qty 1

## 2018-01-22 NOTE — Progress Notes (Signed)
Subjective: Timothy Marks, a 27 year old male with a medical history significant for sickle cell anemia, hemoglobin SS, chronic pain syndrome, and adjustment disorder was admitted and sickle cell crisis. Patient appears to be resting comfortably.  Current pain intensity 9/10 characterized as constant and sharp. Patient currently denies headache, dizziness, chest pain, nausea, vomiting, or diarrhea.  Objective:  Vital signs in last 24 hours:  Vitals:   01/21/18 1830 01/21/18 2125 01/22/18 0159 01/22/18 0537  BP: 125/86 127/82  134/76  Pulse: 80 93 (!) 117 87  Resp: 16 16 (!) 22 20  Temp: 99.1 F (37.3 C) 98.9 F (37.2 C) 98 F (36.7 C) 97.9 F (36.6 C)  TempSrc: Oral Oral Oral Oral  SpO2: 96% 97% 98% 96%  Weight:    56.2 kg    Intake/Output from previous day:   Intake/Output Summary (Last 24 hours) at 01/22/2018 0943 Last data filed at 01/21/2018 2000 Gross per 24 hour  Intake 480 ml  Output 1050 ml  Net -570 ml    Physical Exam: General: Alert, awake, oriented x3, in no acute distress.  HEENT: [place]/AT PEERL, EOMI Neck: Trachea midline,  no masses, no thyromegal,y no JVD, no carotid bruit OROPHARYNX:  Moist, No exudate/ erythema/lesions.  Heart: Regular rate and rhythm, without murmurs, rubs, gallops, PMI non-displaced, no heaves or thrills on palpation.  Lungs: Clear to auscultation, no wheezing or rhonchi noted. No increased vocal fremitus resonant to percussion  Abdomen: Soft, nontender, nondistended, positive bowel sounds, no masses no hepatosplenomegaly noted..  Neuro: No focal neurological deficits noted cranial nerves II through XII grossly intact. DTRs 2+ bilaterally upper and lower extremities. Strength 5 out of 5 in bilateral upper and lower extremities. Musculoskeletal: No warm swelling or erythema around joints, no spinal tenderness noted. Psychiatric: Patient alert and oriented x3, good insight and cognition, good recent to remote recall. Lymph node survey: No  cervical axillary or inguinal lymphadenopathy noted.  Lab Results:  Basic Metabolic Panel:    Component Value Date/Time   NA 138 01/20/2018 0202   NA 143 11/30/2017 1127   K 4.1 01/20/2018 0202   CL 104 01/20/2018 0202   CO2 22 01/20/2018 0202   BUN 8 01/20/2018 0202   BUN 5 (L) 11/30/2017 1127   CREATININE 0.54 (L) 01/20/2018 0202   GLUCOSE 94 01/20/2018 0202   CALCIUM 9.2 01/20/2018 0202   CBC:    Component Value Date/Time   WBC 14.1 (H) 01/20/2018 0202   HGB 10.6 (L) 01/20/2018 0202   HGB 12.5 (L) 11/30/2017 1127   HCT 31.2 (L) 01/20/2018 0202   HCT 38.6 11/30/2017 1127   PLT 430 (H) 01/20/2018 0202   PLT 397 11/30/2017 1127   MCV 86.4 01/20/2018 0202   MCV 81 11/30/2017 1127   NEUTROABS 9.2 (H) 01/20/2018 0202   NEUTROABS 5.8 11/30/2017 1127   LYMPHSABS 3.4 01/20/2018 0202   LYMPHSABS 2.2 11/30/2017 1127   MONOABS 1.0 01/20/2018 0202   EOSABS 0.3 01/20/2018 0202   EOSABS 0.1 11/30/2017 1127   BASOSABS 0.1 01/20/2018 0202   BASOSABS 0.0 11/30/2017 1127    No results found for this or any previous visit (from the past 240 hour(s)).  Studies/Results: No results found.  Medications: Scheduled Meds: . apixaban  5 mg Oral BID  . aspirin EC  81 mg Oral Daily  . cholecalciferol  2,000 Units Oral Daily  . folic acid  1 mg Oral Daily  . gabapentin  300 mg Oral TID  . hydroxyurea  500 mg  Oral BID  . oxyCODONE  10 mg Oral Q4H while awake  . oxyCODONE  20 mg Oral Q12H  . senna-docusate  1 tablet Oral BID   Continuous Infusions: PRN Meds:.benzocaine, HYDROmorphone (DILAUDID) injection, hydrOXYzine, LORazepam, polyethylene glycol, promethazine  Assessment/Plan: Principal Problem:   Sickle cell pain crisis (HCC)  Sickle cell anemia with pain crisis: Continue IV Dilaudid as needed Continue oxycodone 10 mg every 4 hours Discontinued PCA Dilaudid, patient will not conform to PCA safety protocol Discharge plan for 01/23/2018  Leukocytosis: Secondary to  vaso-occlusive crisis. Patient afebrile.  Continue to monitor closely  Anxiety disorder with behavioral changes: Patient is responding to Ativan, continue as needed.  History of right upper extremity DVT: Continue apixaban.  Creatinine stable  Anemia of chronic disease: Stable.  No medical indication for blood transfusion at this time.  Continue to monitor closely.   Code Status: Full Code Family Communication: N/A Disposition Plan: Discharge plan for 01/23/2018     [name]  APRN, MSN, FNP-C Patient [facility] [place], [place] [address] [phone]  If 7PM-7AM, please contact night-coverage.  01/22/2018, 9:43 AM  LOS: 2 days

## 2018-01-23 DIAGNOSIS — M545 Low back pain, unspecified: Secondary | ICD-10-CM

## 2018-01-23 LAB — COMPREHENSIVE METABOLIC PANEL
ALT: 18 U/L (ref 0–44)
AST: 39 U/L (ref 15–41)
Albumin: 4.8 g/dL (ref 3.5–5.0)
Alkaline Phosphatase: 80 U/L (ref 38–126)
Anion gap: 11 (ref 5–15)
BUN: 12 mg/dL (ref 6–20)
CO2: 24 mmol/L (ref 22–32)
Calcium: 9.6 mg/dL (ref 8.9–10.3)
Chloride: 104 mmol/L (ref 98–111)
Creatinine, Ser: 0.66 mg/dL (ref 0.61–1.24)
GFR calc Af Amer: >60 mL/min (ref >60)
GFR calc non Af Amer: >60 mL/min (ref >60)
Glucose, Bld: 86 mg/dL (ref 70–99)
Potassium: 4.6 mmol/L (ref 3.5–5.1)
Sodium: 139 mmol/L (ref 135–145)
Total Bilirubin: 6.9 mg/dL — ABNORMAL HIGH (ref 0.3–1.2)
Total Protein: 8.2 g/dL — ABNORMAL HIGH (ref 6.5–8.1)

## 2018-01-23 LAB — CBC WITH DIFFERENTIAL/PLATELET
Abs Immature Granulocytes: 0.04 10*3/uL (ref 0.00–0.07)
Basophils Absolute: 0 10*3/uL (ref 0.0–0.1)
Basophils Relative: 0 %
Eosinophils Absolute: 0.4 10*3/uL (ref 0.0–0.5)
Eosinophils Relative: 3 %
HCT: 30.8 % — ABNORMAL LOW (ref 39.0–52.0)
Hemoglobin: 10.3 g/dL — ABNORMAL LOW (ref 13.0–17.0)
Immature Granulocytes: 0 %
Lymphocytes Relative: 24 %
Lymphs Abs: 3.3 10*3/uL (ref 0.7–4.0)
MCH: 28.9 pg (ref 26.0–34.0)
MCHC: 33.4 g/dL (ref 30.0–36.0)
MCV: 86.5 fL (ref 80.0–100.0)
Monocytes Absolute: 1 10*3/uL (ref 0.1–1.0)
Monocytes Relative: 7 %
Neutro Abs: 8.6 10*3/uL — ABNORMAL HIGH (ref 1.7–7.7)
Neutrophils Relative %: 66 %
Platelets: 443 10*3/uL — ABNORMAL HIGH (ref 150–400)
RBC: 3.56 MIL/uL — ABNORMAL LOW (ref 4.22–5.81)
RDW: 21.3 % — ABNORMAL HIGH (ref 11.5–15.5)
WBC: 13.3 10*3/uL — ABNORMAL HIGH (ref 4.0–10.5)
nRBC: 0.9 % — ABNORMAL HIGH (ref 0.0–0.2)

## 2018-01-23 NOTE — Progress Notes (Addendum)
Subjective: Timothy Marks, a 27 year old male with a medical history significant for sickle cell anemia, hemoglobin SS, chronic pain syndrome, and adjustment disorder was admitted and sickle cell crisis.  Patient says that pain has not improved overnight. Current pain intensity is 8/10. Patient has not requested medication for breakthrough pain this morning. Patient is concerned about whether he will receive prescription opiates at discharge. He is really agitated and angry this am.   Patient currently denies headache, dizziness, chest pain, nausea, vomiting, or diarrhea.  Objective:  Vital signs in last 24 hours:  Vitals:   01/22/18 1250 01/22/18 2043 01/23/18 0635 01/23/18 0939  BP: 122/63 121/69 130/87 107/63  Pulse: 90 94 (!) 106 100  Resp: 16 15 18 16   Temp:  98.9 F (37.2 C) 99.5 F (37.5 C) 97.8 F (36.6 C)  TempSrc:  Oral Oral Oral  SpO2: 96% 95% 97% 98%  Weight:   54 kg     Intake/Output from previous day:   Intake/Output Summary (Last 24 hours) at 01/23/2018 1006 Last data filed at 01/22/2018 1500 Gross per 24 hour  Intake -  Output 800 ml  Net -800 ml    Physical Exam: General: Alert, awake, oriented x3, in moderate distress HEENT: [place]/AT PEERL, EOMI Neck: Trachea midline,  no masses, no thyromegal,y no JVD, no carotid bruit OROPHARYNX:  Moist, No exudate/ erythema/lesions.  Heart: Regular rate and rhythm, without murmurs, rubs, gallops, PMI non-displaced, no heaves or thrills on palpation.  Lungs: Clear to auscultation, no wheezing or rhonchi noted. No increased vocal fremitus resonant to percussion  Abdomen: Soft, nontender, nondistended, positive bowel sounds, no masses no hepatosplenomegaly noted..  Neuro: No focal neurological deficits noted cranial nerves II through XII grossly intact. DTRs 2+ bilaterally upper and lower extremities. Strength 5 out of 5 in bilateral upper and lower extremities. Musculoskeletal: No warm swelling or erythema around joints,  no spinal tenderness noted. Psychiatric: Patient alert and oriented x3, good insight and cognition, good recent to remote recall. Lymph node survey: No cervical axillary or inguinal lymphadenopathy noted.  Lab Results:  Basic Metabolic Panel:    Component Value Date/Time   NA 139 01/23/2018 0332   NA 143 11/30/2017 1127   K 4.6 01/23/2018 0332   CL 104 01/23/2018 0332   CO2 24 01/23/2018 0332   BUN 12 01/23/2018 0332   BUN 5 (L) 11/30/2017 1127   CREATININE 0.66 01/23/2018 0332   GLUCOSE 86 01/23/2018 0332   CALCIUM 9.6 01/23/2018 0332   CBC:    Component Value Date/Time   WBC 13.3 (H) 01/23/2018 0332   HGB 10.3 (L) 01/23/2018 0332   HGB 12.5 (L) 11/30/2017 1127   HCT 30.8 (L) 01/23/2018 0332   HCT 38.6 11/30/2017 1127   PLT 443 (H) 01/23/2018 0332   PLT 397 11/30/2017 1127   MCV 86.5 01/23/2018 0332   MCV 81 11/30/2017 1127   NEUTROABS 8.6 (H) 01/23/2018 0332   NEUTROABS 5.8 11/30/2017 1127   LYMPHSABS 3.3 01/23/2018 0332   LYMPHSABS 2.2 11/30/2017 1127   MONOABS 1.0 01/23/2018 0332   EOSABS 0.4 01/23/2018 0332   EOSABS 0.1 11/30/2017 1127   BASOSABS 0.0 01/23/2018 0332   BASOSABS 0.0 11/30/2017 1127    No results found for this or any previous visit (from the past 240 hour(s)).  Studies/Results: No results found.  Medications: Scheduled Meds: . apixaban  5 mg Oral BID  . aspirin EC  81 mg Oral Daily  . cholecalciferol  2,000 Units Oral Daily  .  folic acid  1 mg Oral Daily  . gabapentin  300 mg Oral TID  . hydroxyurea  500 mg Oral BID  . oxyCODONE  10 mg Oral Q4H while awake  . oxyCODONE  20 mg Oral Q12H  . senna-docusate  1 tablet Oral BID   Continuous Infusions: PRN Meds:.benzocaine, HYDROmorphone (DILAUDID) injection, hydrOXYzine, LORazepam, polyethylene glycol, promethazine  Assessment/Plan: Principal Problem:   Sickle cell pain crisis (HCC) Active Problems:   Adjustment disorder with mixed disturbance of emotions and conduct   Other chronic  pain   Anemia of chronic disease  Sickle cell anemia with pain crisis: Continue IV Dilaudid as needed Continue oxycodone 10 mg every 4 hours  Leukocytosis: Secondary to vaso-occlusive crisis. Patient afebrile.  Continue to monitor closely  Anxiety disorder with behavioral changes: Patient is responding to Ativan, continue as needed.  History of right upper extremity DVT: Continue apixaban.  Creatinine stable  Anemia of chronic disease: Stable.  No medical indication for blood transfusion at this time.  Continue to monitor closely.   Code Status: Full Code Family Communication: N/A Disposition Plan: Discharge plan for 01/23/2018     [name]  APRN, MSN, FNP-C Patient [facility] [place], [place] [address] [phone]  If 5PM-7AM, please contact night-coverage.  01/23/2018, 10:06 AM  LOS: 3 days

## 2018-01-24 MED ORDER — LORAZEPAM 2 MG/ML IJ SOLN: 1.0000 mg | Freq: Once | INTRAMUSCULAR | Status: DC

## 2018-01-24 MED ORDER — HYDROMORPHONE HCL 1 MG/ML IJ SOLN
1.0000 mg | Freq: Once | INTRAMUSCULAR | Status: AC
  Administered 2018-01-24: 1 mg via INTRAVENOUS
  Filled 2018-01-24: qty 1

## 2018-01-24 NOTE — Discharge Instructions (Signed)
Sickle Cell Anemia, Adult °Sickle cell anemia is a condition where your red blood cells are shaped like sickles. Red blood cells carry oxygen through the body. Sickle-shaped red blood cells do not live as long as normal red blood cells. They also clump together and block blood from flowing through the blood vessels. These things prevent the body from getting enough oxygen. Sickle cell anemia causes organ damage and pain. It also increases the risk of infection. °Follow these instructions at home: °· Drink enough fluid to keep your pee (urine) clear or pale yellow. Drink more in hot weather and during exercise. °· Do not smoke. Smoking lowers oxygen levels in the blood. °· Only take over-the-counter or prescription medicines as told by your doctor. °· Take antibiotic medicines as told by your doctor. Make sure you finish them even if you start to feel better. °· Take supplements as told by your doctor. °· Consider wearing a medical alert bracelet. This tells anyone caring for you in an emergency of your condition. °· When traveling, keep your medical information, doctors' names, and the medicines you take with you at all times. °· If you have a fever, do not take fever medicines right away. This could cover up a problem. Tell your doctor. °· Keep all follow-up visits with your doctor. Sickle cell anemia requires regular medical care. °Contact a doctor if: °You have a fever. °Get help right away if: °· You feel dizzy or faint. °· You have new belly (abdominal) pain, especially on the left side near the stomach area. °· You have a lasting, often uncomfortable and painful erection of the penis (priapism). If it is not treated right away, you will become unable to have sex (impotence). °· You have numbness in your arms or legs or you have a hard time moving them. °· You have a hard time talking. °· You have a fever or lasting symptoms for more than 2-3 days. °· You have a fever and your symptoms suddenly get  worse. °· You have signs or symptoms of infection. These include: °? Chills. °? Being more tired than normal (lethargy). °? Irritability. °? Poor eating. °? Throwing up (vomiting). °· You have pain that is not helped with medicine. °· You have shortness of breath. °· You have pain in your chest. °· You are coughing up pus-like or bloody mucus. °· You have a stiff neck. °· Your feet or hands swell or have pain. °· Your belly looks bloated. °· Your joints hurt. °This information is not intended to replace advice given to you by your health care provider. Make sure you discuss any questions you have with your health care provider. °Document Released: 11/21/2012 Document Revised: 07/09/2015 Document Reviewed: 09/12/2012 °Elsevier Interactive Patient Education © 2017 [employer] Inc. ° °

## 2018-01-24 NOTE — Progress Notes (Signed)
Subjective: Timothy Marks, a 27 year old male with a medical history significant for sickle cell anemia, hemoglobin SS, chronic pain syndrome, and adjustment disorder was admitted and sickle cell crisis.  Patient says that pain has not improved overnight. Current pain intensity is 9/10. He is really agitated and angry.   Patient currently denies headache, dizziness, chest pain, nausea, vomiting, or diarrhea.  Objective:  Vital signs in last 24 hours:  Vitals:   01/24/18 0133 01/24/18 0500 01/24/18 0651 01/24/18 1436  BP: 134/83 128/67 121/68 113/71  Pulse: 85 (!) 105 97 (!) 103  Resp: 16  20 18   Temp:   98.5 F (36.9 C) 98.2 F (36.8 C)  TempSrc:   Oral Oral  SpO2: 100% 95% 94% 94%  Weight:        Intake/Output from previous day:   Intake/Output Summary (Last 24 hours) at 01/24/2018 1538 Last data filed at 01/24/2018 1100 Gross per 24 hour  Intake 560 ml  Output 2050 ml  Net -1490 ml    Physical Exam: General: Alert, awake, oriented x3, in moderate distress HEENT: [place]/AT PEERL, EOMI Neck: Trachea midline,  no masses, no thyromegal,y no JVD, no carotid bruit OROPHARYNX:  Moist, No exudate/ erythema/lesions.  Heart: Regular rate and rhythm, without murmurs, rubs, gallops, PMI non-displaced, no heaves or thrills on palpation.  Lungs: Clear to auscultation, no wheezing or rhonchi noted. No increased vocal fremitus resonant to percussion  Abdomen: Soft, nontender, nondistended, positive bowel sounds, no masses no hepatosplenomegaly noted..  Neuro: No focal neurological deficits noted cranial nerves II through XII grossly intact. DTRs 2+ bilaterally upper and lower extremities. Strength 5 out of 5 in bilateral upper and lower extremities. Musculoskeletal: No warm swelling or erythema around joints, no spinal tenderness noted. Psychiatric: Patient alert and oriented x3, good insight and cognition, good recent to remote recall. Lymph node survey: No cervical axillary or inguinal  lymphadenopathy noted.  Lab Results:  Basic Metabolic Panel:    Component Value Date/Time   NA 139 01/23/2018 0332   NA 143 11/30/2017 1127   K 4.6 01/23/2018 0332   CL 104 01/23/2018 0332   CO2 24 01/23/2018 0332   BUN 12 01/23/2018 0332   BUN 5 (L) 11/30/2017 1127   CREATININE 0.66 01/23/2018 0332   GLUCOSE 86 01/23/2018 0332   CALCIUM 9.6 01/23/2018 0332   CBC:    Component Value Date/Time   WBC 13.3 (H) 01/23/2018 0332   HGB 10.3 (L) 01/23/2018 0332   HGB 12.5 (L) 11/30/2017 1127   HCT 30.8 (L) 01/23/2018 0332   HCT 38.6 11/30/2017 1127   PLT 443 (H) 01/23/2018 0332   PLT 397 11/30/2017 1127   MCV 86.5 01/23/2018 0332   MCV 81 11/30/2017 1127   NEUTROABS 8.6 (H) 01/23/2018 0332   NEUTROABS 5.8 11/30/2017 1127   LYMPHSABS 3.3 01/23/2018 0332   LYMPHSABS 2.2 11/30/2017 1127   MONOABS 1.0 01/23/2018 0332   EOSABS 0.4 01/23/2018 0332   EOSABS 0.1 11/30/2017 1127   BASOSABS 0.0 01/23/2018 0332   BASOSABS 0.0 11/30/2017 1127    No results found for this or any previous visit (from the past 240 hour(s)).  Studies/Results: No results found.  Medications: Scheduled Meds: . apixaban  5 mg Oral BID  . aspirin EC  81 mg Oral Daily  . cholecalciferol  2,000 Units Oral Daily  . folic acid  1 mg Oral Daily  . gabapentin  300 mg Oral TID  . hydroxyurea  500 mg Oral BID  .  oxyCODONE  10 mg Oral Q4H while awake  . oxyCODONE  20 mg Oral Q12H  . senna-docusate  1 tablet Oral BID   Continuous Infusions: PRN Meds:.benzocaine, HYDROmorphone (DILAUDID) injection, hydrOXYzine, LORazepam, polyethylene glycol, promethazine  Assessment/Plan: Principal Problem:   Sickle cell pain crisis (HCC) Active Problems:   Adjustment disorder with mixed disturbance of emotions and conduct   Other chronic pain   Anemia of chronic disease   Acute bilateral low back pain without sciatica  Sickle cell anemia with pain crisis: Continue IV Dilaudid as needed Continue oxycodone 10 mg  every 4 hours  Leukocytosis: Secondary to vaso-occlusive crisis. Patient afebrile.  Continue to monitor closely  Anxiety disorder with behavioral changes: Patient is responding to Ativan, continue as needed.  History of right upper extremity DVT: Continue apixaban.  Creatinine stable  Anemia of chronic disease: Stable.  No medical indication for blood transfusion at this time.  Continue to monitor closely.   Code Status: Full Code Family Communication: N/A Disposition Plan: Discharge plan for 01/24/2018     [name]  APRN, MSN, FNP-C Patient [facility] [place], [place] [address] [phone]  If 5PM-7AM, please contact night-coverage.  01/24/2018, 3:38 PM  LOS: 4 days

## 2018-01-25 NOTE — Progress Notes (Signed)
Patient discharged to home, discharge instructions reviewed with patient who verbalized understanding. No new RX's.

## 2018-01-25 NOTE — Discharge Summary (Signed)
Physician Discharge Summary  Timothy Marks PXT:[id number] DOB: 08/25/1990 DOA: 01/20/2018  PCP: Lanae Boast, FNP  Admit date: 01/20/2018  Discharge date: 01/25/2018  Discharge Diagnoses:  Principal Problem:   Sickle cell pain crisis ([place]) Active Problems:   Adjustment disorder with mixed disturbance of emotions and conduct   Other chronic pain   Anemia of chronic disease   Acute bilateral low back pain without sciatica   Discharge Condition: Stable  Disposition:  Pt is discharged home in good condition and is to follow up with Lanae Boast, [place] within 1 week to have labs evaluated. Macario Shear is instructed to increase activity slowly and balance with rest for the next few days, and use prescribed medication to complete treatment of pain  Diet: Regular Wt Readings from Last 3 Encounters:  01/25/18 53.7 kg  01/18/18 56.2 kg  12/13/17 56.2 kg    History of present illness:  Timothy Marks, a 27 year old male with a medical history of sickle cell anemia, hemoglobin SS, chronic pain syndrome, and adjustment disorder with anxiety presented to the emergency room with worsening low back pain since 1 day prior to admission.  Patient denied chest pain, shortness of breath, any weakness of lower extremities or any difficulty with urination. Emergency room course: In ER, despite given multiple doses of pain relief medication patient still has pain and has been admitted in sickle cell pain crisis.  Hospital Course:  Sickle cell anemia with pain crisis: Patient's pain was managed appropriately with IV fluids, IV Dilaudid via PCA, and IV Toradol.  PCA Dilaudid was discontinued due to inability to follow safety protocols.  Patient was managed appropriately with oxycodone 10 mg every 4 hours scheduled and OxyContin per preadmission dosages.  Patient was also given IV Dilaudid boluses every 6 hours for moderate to severe breakthrough pain.  Patient continued to complain of pain despite  treatment modalities. Patient has a history of anemia of chronic disease, the patient's hemoglobin remained stable throughout hospitalization and he was continued on hydroxyurea without interruption. Chronic pain syndrome: Patient has a long history of chronic pain syndrome.  Patient was frequently agitated and angry concerning pain control.  Patient often had difficulty understanding pain scale. Patient is to follow-up with primary provider within 1 week.  When discussing discharge, patient became angry and turned on a rap music video very loudly.  Patient often demonstrates very maladaptive behaviors including nonadherence to monitoring devices for PCA and being very uncooperative with medical staff.  Patient would benefit greatly from psychiatry and possible antipsychotic medications.  Patient declines to discuss anxiety or behaviors.  Patient was discharged home today in a hemodynamically stable condition.   Discharge Exam: Vitals:   01/24/18 2250 01/25/18 0601  BP: (!) 141/82 127/74  Pulse: (!) 109 (!) 114  Resp: 16 16  Temp: 98.6 F (37 C) 98.2 F (36.8 C)  SpO2: 93% 94%   Vitals:   01/24/18 0651 01/24/18 1436 01/24/18 2250 01/25/18 0601  BP: 121/68 113/71 (!) 141/82 127/74  Pulse: 97 (!) 103 (!) 109 (!) 114  Resp: 20 18 16 16   Temp: 98.5 F (36.9 C) 98.2 F (36.8 C) 98.6 F (37 C) 98.2 F (36.8 C)  TempSrc: Oral Oral Oral Oral  SpO2: 94% 94% 93% 94%  Weight:    53.7 kg   Physical Exam Constitutional:      Appearance: He is well-developed and well-nourished.  Cardiovascular:     Rate and Rhythm: Regular rhythm.  Pulmonary:     Effort: Pulmonary  effort is normal.  Abdominal:     Palpations: Abdomen is soft.  Skin:    General: Skin is warm and dry.  Psychiatric:        Attention and Perception: He is inattentive.        Mood and Affect: Mood is anxious. Affect is angry and inappropriate.        Behavior: Behavior is agitated and aggressive.        Thought  Content: Thought content does not include homicidal or suicidal ideation.     Discharge Instructions  Discharge Instructions    Discharge patient   Complete by:  As directed    Discharge disposition:  01-Home or Self Care   Discharge patient date:  01/25/2018     Allergies as of 01/25/2018      Reactions   Buprenorphine Hcl Hives   Levaquin [levofloxacin] Itching   Meperidine Rash   Morphine And Related Hives   Toradol [ketorolac Tromethamine] Itching   Tramadol Hives   Vancomycin Itching   Zosyn [piperacillin Sod-tazobactam So] Itching, Rash   Has taken rocephin in past      Medication List    TAKE these medications   aspirin EC 81 MG tablet Take 1 tablet (81 mg total) by mouth daily.   cholecalciferol 25 MCG (1000 UT) tablet Commonly known as:  VITAMIN D Take 2 tablets (2,000 Units total) by mouth daily.   ELIQUIS STARTER PACK 5 MG Tabs Take as directed on package: start with two-5mg  tablets twice daily for 7 days. On day 8, switch to one-5mg  tablet twice daily.   folic acid 1 MG tablet Commonly known as:  FOLVITE Take 1 tablet (1 mg total) by mouth daily.   gabapentin 300 MG capsule Commonly known as:  NEURONTIN Take 1 capsule (300 mg total) by mouth 3 (three) times daily. For 30 days.   hydroxyurea 500 MG capsule Commonly known as:  HYDREA Take 1 capsule (500 mg total) by mouth 2 (two) times daily. May take with food to minimize GI side effects.   ibuprofen 800 MG tablet Commonly known as:  ADVIL,MOTRIN Take 1 tablet (800 mg total) by mouth 3 (three) times daily as needed (pain).   oxyCODONE 20 mg 12 hr tablet Commonly known as:  OXYCONTIN Take 1 tablet (20 mg total) by mouth every 12 (twelve) hours.   oxyCODONE-acetaminophen 10-325 MG tablet Commonly known as:  PERCOCET Take 1 tablet by mouth every 6 (six) hours as needed for up to 15 days for pain.       The results of significant diagnostics from this hospitalization (including imaging,  microbiology, ancillary and laboratory) are listed below for reference.    Significant Diagnostic Studies: No results found.  Microbiology: No results found for this or any previous visit (from the past 240 hour(s)).   Labs: Basic Metabolic Panel: Recent Labs  Lab 01/20/18 0202 01/23/18 0332  NA 138 139  K 4.1 4.6  CL 104 104  CO2 22 24  GLUCOSE 94 86  BUN 8 12  CREATININE 0.54* 0.66  CALCIUM 9.2 9.6   Liver Function Tests: Recent Labs  Lab 01/20/18 0202 01/23/18 0332  AST 30 39  ALT 12 18  ALKPHOS 75 80  BILITOT 2.6* 6.9*  PROT 8.1 8.2*  ALBUMIN 4.6 4.8   No results for input(s): LIPASE, AMYLASE in the last 168 hours. No results for input(s): AMMONIA in the last 168 hours. CBC: Recent Labs  Lab 01/20/18 0202 01/23/18 0332  WBC  14.1* 13.3*  NEUTROABS 9.2* 8.6*  HGB 10.6* 10.3*  HCT 31.2* 30.8*  MCV 86.4 86.5  PLT 430* 443*   Cardiac Enzymes: No results for input(s): CKTOTAL, CKMB, CKMBINDEX, TROPONINI in the last 168 hours. BNP: Invalid input(s): POCBNP CBG: No results for input(s): GLUCAP in the last 168 hours.  Time coordinating discharge: 20 minutes  Signed:  [name]  APRN, MSN, FNP-C Patient [facility] [address] [place], [place] [address] [phone]  [facility] 01/25/2018, 8:54 AM

## 2018-01-29 ENCOUNTER — Telehealth: Payer: Self-pay

## 2018-01-29 DIAGNOSIS — D571 Sickle-cell disease without crisis: Secondary | ICD-10-CM

## 2018-01-29 MED ORDER — OXYCODONE-ACETAMINOPHEN 10-325 MG PO TABS: 1.0000 | ORAL_TABLET | Freq: Four times a day (QID) | ORAL | 0 refills | Status: DC | PRN

## 2018-01-29 NOTE — Telephone Encounter (Signed)
Reviewed [place] Substance Reporting system prior to prescribing opiate medications. No inconsistencies noted.   

## 2018-02-02 ENCOUNTER — Telehealth: Payer: Self-pay

## 2018-02-02 MED ORDER — OXYCODONE HCL ER 15 MG PO T12A: 15.0000 mg | EXTENDED_RELEASE_TABLET | Freq: Three times a day (TID) | ORAL | 0 refills | Status: DC

## 2018-02-02 NOTE — Telephone Encounter (Signed)
Patient called and wants to know I OxyContin is going to be changed to morphine since the OxyContin not working any more. Please advise. Thanks!

## 2018-02-02 NOTE — Telephone Encounter (Signed)
Reviewed [place] Substance Reporting system prior to prescribing opiate medications. No inconsistencies noted.  Changed from Oxycontin 20 mg BID to Oxycontin ER15 mg TID after discussing with Dr. Doreene Burke, MD.

## 2018-02-10 DIAGNOSIS — F1120 Opioid dependence, uncomplicated: Secondary | ICD-10-CM | POA: Insufficient documentation

## 2018-02-13 ENCOUNTER — Other Ambulatory Visit: Payer: Self-pay | Admitting: Family Medicine

## 2018-02-13 ENCOUNTER — Telehealth: Payer: Self-pay

## 2018-02-13 DIAGNOSIS — D571 Sickle-cell disease without crisis: Secondary | ICD-10-CM

## 2018-02-13 MED ORDER — OXYCODONE-ACETAMINOPHEN 10-325 MG PO TABS: 1.0000 | ORAL_TABLET | Freq: Four times a day (QID) | ORAL | 0 refills | Status: DC | PRN

## 2018-02-15 ENCOUNTER — Ambulatory Visit: Payer: Self-pay | Admitting: Family Medicine

## 2018-02-28 ENCOUNTER — Telehealth: Payer: Self-pay

## 2018-02-28 ENCOUNTER — Encounter: Payer: Self-pay | Admitting: Family Medicine

## 2018-02-28 ENCOUNTER — Ambulatory Visit (INDEPENDENT_AMBULATORY_CARE_PROVIDER_SITE_OTHER): Payer: Medicaid Other | Admitting: Family Medicine

## 2018-02-28 VITALS — BP 122/72 | HR 66 | Temp 98.7°F | Resp 14 | Ht 74.0 in | Wt 124.0 lb

## 2018-02-28 DIAGNOSIS — D571 Sickle-cell disease without crisis: Secondary | ICD-10-CM

## 2018-02-28 LAB — POCT URINALYSIS DIPSTICK
Bilirubin, UA: NEGATIVE
Blood, UA: NEGATIVE
Glucose, UA: NEGATIVE
Ketones, UA: NEGATIVE
Leukocytes, UA: NEGATIVE
Nitrite, UA: NEGATIVE
Protein, UA: POSITIVE — AB
Spec Grav, UA: 1.015 (ref 1.010–1.025)
Urobilinogen, UA: >=8 E.U./dL — AB
pH, UA: 7 (ref 5.0–8.0)

## 2018-02-28 MED ORDER — OXYCODONE-ACETAMINOPHEN 10-325 MG PO TABS: 1.0000 | ORAL_TABLET | Freq: Four times a day (QID) | ORAL | 0 refills | Status: DC | PRN

## 2018-02-28 MED ORDER — OXYCODONE HCL ER 15 MG PO T12A: 15.0000 mg | EXTENDED_RELEASE_TABLET | Freq: Three times a day (TID) | ORAL | 0 refills | Status: DC

## 2018-02-28 NOTE — Progress Notes (Signed)
PATIENT CARE CENTER INTERNAL MEDICINE AND SICKLE CELL CARE  SICKLE CELL ANEMIA FOLLOW UP VISIT PROVIDER: Lanae Boast, FNP    Subjective:   Timothy Marks  is a 28 y.o.  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 presents for a follow up for Sickle Cell Anemia. The patient has had 2 admissions in the past 6 months.  Pain regimen includes: Ibuprofen, percocet and oxycontin  Hydrea Therapy: Yes Medication compliance: Yes  Pain today is 6/10 and is in the low back.  The patient reports adequate daily hydration.    Patient seen at [facility] on 02/11/2018. Patient treated for CAP with ceftriaxone and azithromycin. Patient left AMA.  Patient reports completing the oral antibiotic course.    Review of Systems  Constitutional: Negative.   HENT: Negative.   Eyes: Negative.   Respiratory: Negative.   Cardiovascular: Negative.   Gastrointestinal: Negative.   Genitourinary: Negative.   Musculoskeletal: Positive for back pain.  Skin: Negative.   Neurological: Negative.   Psychiatric/Behavioral: Negative.     Objective:   Objective  BP 122/72 (BP Location: Left Arm, Patient Position: Sitting, Cuff Size: Normal)   Pulse 66   Temp 98.7 F (37.1 C) (Oral)   Resp 14   Ht 6\' 2"  (1.88 m)   Wt 124 lb (56.2 kg)   SpO2 100%   BMI 15.92 kg/m   Wt Readings from Last 3 Encounters:  02/28/18 124 lb (56.2 kg)  01/25/18 118 lb 6.2 oz (53.7 kg)  01/18/18 124 lb (56.2 kg)     Physical Exam Vitals signs and nursing note reviewed.  Constitutional:      General: He is not in acute distress.    Appearance: He is well-developed.  HENT:     Head: Normocephalic and atraumatic.  Eyes:     Conjunctiva/sclera: Conjunctivae normal.     Pupils: Pupils are equal, round, and reactive to light.  Neck:     Musculoskeletal: Normal range of motion.    Cardiovascular:     Rate and Rhythm: Normal rate and regular rhythm.     Heart sounds: Normal heart sounds.  Pulmonary:     Effort: Pulmonary effort is normal. No respiratory distress.     Breath sounds: Normal breath sounds.  Abdominal:     General: Bowel sounds are normal. There is no distension.     Palpations: Abdomen is soft.  Musculoskeletal: Normal range of motion.  Skin:    General: Skin is warm and dry.  Neurological:     Mental Status: He is alert and oriented to person, place, and time.  Psychiatric:        Behavior: Behavior normal.        Thought Content: Thought content normal.      Assessment/Plan:   Assessment   Encounter Diagnosis  Name Primary?  Marland Kitchen Hb-SS disease without crisis ([place]) Yes     Plan  1. Hb-SS disease without crisis ([place]) - Urinalysis Dipstick - oxyCODONE-acetaminophen (PERCOCET) 10-325 MG tablet; Take 1 tablet by mouth every 6 (six) hours as needed for up to 15 days for pain.  Dispense: 60 tablet; Refill: 0 - 284132 11+Oxyco+Alc+Crt-Bund    Return to care as scheduled and prn. Patient verbalized understanding and agreed with plan of care.   1. Sickle cell disease - Continue Hydrea We discussed the need for good hydration, monitoring of hydration status, avoidance  of heat, cold, stress, and infection triggers. We discussed the risks and benefits of Hydrea, including bone marrow suppression, the possibility of GI upset, skin ulcers, hair thinning, and teratogenicity. The patient was reminded of the need to seek medical attention of any symptoms of bleeding, anemia, or infection. Continue folic acid 1 mg daily to prevent aplastic bone marrow crises.   2. Pulmonary evaluation - Patient denies severe recurrent wheezes, shortness of breath with exercise, or persistent cough. If these symptoms develop, pulmonary function tests with spirometry will be ordered, and if abnormal, plan on referral to Pulmonology for further evaluation.  3. Cardiac -  Routine screening for pulmonary hypertension is not recommended.  4. Eye - High risk of proliferative retinopathy. Annual eye exam with retinal exam recommended to patient.  5. Immunization status -  Yearly influenza vaccination is recommended, as well as being up to date with Meningococcal and Pneumococcal vaccines.   6. Acute and chronic painful episodes - We discussed that pt is to receive Schedule II prescriptions only from [place]. Pt is also aware that the prescription history is available to [place] online through the [facility] CSRS. Controlled substance agreement signed. We reminded Gilman Olazabal that all patients receiving Schedule II narcotics must be seen for follow within one month of prescription being requested. We reviewed the terms of our pain agreement, including the need to keep medicines in a safe locked location away from children or pets, and the need to report excess sedation or constipation, measures to avoid constipation, and policies related to early refills and stolen prescriptions. According to the [place] Chronic Pain Initiative program, we have reviewed details related to analgesia, adverse effects, aberrant behaviors.  7. Iron overload from chronic transfusion.  Not applicable at this time.  If this occurs will use Exjade for management.   8. Vitamin D deficiency - Drisdol 50,000 units weekly. Patient encouraged to take as prescribed.   The above recommendations are taken from the NIH Evidence-Based Management of Sickle Cell Disease: Expert Panel Report, 20149.   [name], FNP-BC [facility] [address] [place],  [address] [phone]  This note has been created with Dragon speech recognition software and smart phrase technology. Any transcriptional errors are unintentional.

## 2018-02-28 NOTE — Patient Instructions (Signed)
Sickle Cell Anemia, Adult °Sickle cell anemia is a condition where your red blood cells are shaped like sickles. Red blood cells carry oxygen through the body. Sickle-shaped cells do not live as long as normal red blood cells. They also clump together and block blood from flowing through the blood vessels. This prevents the body from getting enough oxygen. Sickle cell anemia causes organ damage and pain. It also increases the risk of infection. °Follow these instructions at home: °Medicines °· Take over-the-counter and prescription medicines only as told by your doctor. °· If you were prescribed an antibiotic medicine, take it as told by your doctor. Do not stop taking the antibiotic even if you start to feel better. °· If you develop a fever, do not take medicines to lower the fever right away. Tell your doctor about the fever. °Managing pain, stiffness, and swelling °· Try these methods to help with pain: °? Use a heating pad. °? Take a warm bath. °? Distract yourself, such as by watching TV. °Eating and drinking °· Drink enough fluid to keep your pee (urine) clear or pale yellow. Drink more in hot weather and during exercise. °· Limit or avoid alcohol. °· Eat a healthy diet. Eat plenty of fruits, vegetables, whole grains, and lean protein. °· Take vitamins and supplements as told by your doctor. °Traveling °· When traveling, keep these with you: °? Your medical information. °? The names of your doctors. °? Your medicines. °· If you need to take an airplane, talk to your doctor first. °Activity °· Rest often. °· Avoid exercises that make your heart beat much faster, such as jogging. °General instructions °· Do not use products that have nicotine or tobacco, such as cigarettes and e-cigarettes. If you need help quitting, ask your doctor. °· Consider wearing a medical alert bracelet. °· Avoid being in high places (high altitudes), such as mountains. °· Avoid very hot or cold temperatures. °· Avoid places where the  temperature changes a lot. °· Keep all follow-up visits as told by your doctor. This is important. °Contact a doctor if: °· A joint hurts. °· Your feet or hands hurt or swell. °· You feel tired (fatigued). °Get help right away if: °· You have symptoms of infection. These include: °? Fever. °? Chills. °? Being very tired. °? Irritability. °? Poor eating. °? Throwing up (vomiting). °· You feel dizzy or faint. °· You have new stomach pain, especially on the left side. °· You have a an erection (priapism) that lasts more than 4 hours. °· You have numbness in your arms or legs. °· You have a hard time moving your arms or legs. °· You have trouble talking. °· You have pain that does not go away when you take medicine. °· You are short of breath. °· You are breathing fast. °· You have a long-term cough. °· You have pain in your chest. °· You have a bad headache. °· You have a stiff neck. °· Your stomach looks bloated even though you did not eat much. °· Your skin is pale. °· You suddenly cannot see well. °Summary °· Sickle cell anemia is a condition where your red blood cells are shaped like sickles. °· Follow your doctor's advice on ways to manage pain, food to eat, activities to do, and steps to take for safe travel. °· Get medical help right away if you have any signs of infection, such as a fever. °This information is not intended to replace advice given to you by your   health care provider. Make sure you discuss any questions you have with your health care provider. °Document Released: 11/21/2012 Document Revised: 03/08/2016 Document Reviewed: 03/08/2016 °Elsevier Interactive Patient Education © 2019 Elsevier Inc. ° °

## 2018-02-28 NOTE — Telephone Encounter (Signed)
Can you re-send rx to correct pharmacy.

## 2018-03-01 NOTE — Telephone Encounter (Signed)
Patient called and was made aware of this on 02/28/2018. Thanks!

## 2018-03-01 NOTE — Telephone Encounter (Signed)
Denied. Addressed yesterday. Patient was asked to verify the pharmacy prior to sending. He will need to pick it up from the pharmacy.

## 2018-03-06 LAB — CANNABINOID CONFIRMATION, UR
CANNABINOIDS: POSITIVE — AB
Carboxy THC GC/MS Conf: >400 ng/mL

## 2018-03-06 LAB — DRUG SCREEN 764883 11+OXYCO+ALC+CRT-BUND
Amphetamines, Urine: NEGATIVE ng/mL
BENZODIAZ UR QL: NEGATIVE ng/mL
Barbiturate: NEGATIVE ng/mL
Cocaine (Metabolite): NEGATIVE ng/mL
Creatinine: 123.9 mg/dL (ref 20.0–300.0)
Ethanol: NEGATIVE %
Meperidine: NEGATIVE ng/mL
Methadone Screen, Urine: NEGATIVE ng/mL
OPIATE SCREEN URINE: NEGATIVE ng/mL
Oxycodone/Oxymorphone, Urine: NEGATIVE ng/mL
Phencyclidine: NEGATIVE ng/mL
Propoxyphene: NEGATIVE ng/mL
Tramadol: NEGATIVE ng/mL
pH, Urine: 7.8 (ref 4.5–8.9)

## 2018-03-12 ENCOUNTER — Telehealth: Payer: Self-pay

## 2018-03-12 DIAGNOSIS — D571 Sickle-cell disease without crisis: Secondary | ICD-10-CM

## 2018-03-12 MED ORDER — OXYCODONE-ACETAMINOPHEN 10-325 MG PO TABS: 1.0000 | ORAL_TABLET | Freq: Four times a day (QID) | ORAL | 0 refills | Status: DC | PRN

## 2018-03-12 NOTE — Telephone Encounter (Signed)
Reviewed [place] Substance Reporting system prior to prescribing opiate medications. No inconsistencies noted.   

## 2018-03-18 ENCOUNTER — Other Ambulatory Visit: Payer: Self-pay

## 2018-03-18 ENCOUNTER — Emergency Department (HOSPITAL_COMMUNITY)
Admission: EM | Admit: 2018-03-18 | Discharge: 2018-03-18 | Disposition: A | Discharge status: Home / Self Care | Payer: Medicaid Other | Attending: Emergency Medicine | Admitting: Emergency Medicine

## 2018-03-18 DIAGNOSIS — Z7982 Long term (current) use of aspirin: Secondary | ICD-10-CM | POA: Diagnosis not present

## 2018-03-18 DIAGNOSIS — Z79899 Other long term (current) drug therapy: Secondary | ICD-10-CM | POA: Insufficient documentation

## 2018-03-18 DIAGNOSIS — D5700 Hb-SS disease with crisis, unspecified: Secondary | ICD-10-CM | POA: Insufficient documentation

## 2018-03-18 DIAGNOSIS — M545 Low back pain, unspecified: Secondary | ICD-10-CM

## 2018-03-18 DIAGNOSIS — E876 Hypokalemia: Secondary | ICD-10-CM | POA: Diagnosis not present

## 2018-03-18 LAB — CBC WITH DIFFERENTIAL/PLATELET
Abs Immature Granulocytes: 0.04 10*3/uL (ref 0.00–0.07)
Basophils Absolute: 0 10*3/uL (ref 0.0–0.1)
Basophils Relative: 0 %
Eosinophils Absolute: 0.3 10*3/uL (ref 0.0–0.5)
Eosinophils Relative: 4 %
HCT: 28.2 % — ABNORMAL LOW (ref 39.0–52.0)
Hemoglobin: 9.9 g/dL — ABNORMAL LOW (ref 13.0–17.0)
Immature Granulocytes: 1 %
Lymphocytes Relative: 18 %
Lymphs Abs: 1.6 10*3/uL (ref 0.7–4.0)
MCH: 29.6 pg (ref 26.0–34.0)
MCHC: 35.1 g/dL (ref 30.0–36.0)
MCV: 84.4 fL (ref 80.0–100.0)
Monocytes Absolute: 0.8 10*3/uL (ref 0.1–1.0)
Monocytes Relative: 10 %
Neutro Abs: 5.8 10*3/uL (ref 1.7–7.7)
Neutrophils Relative %: 67 %
Platelets: 428 10*3/uL — ABNORMAL HIGH (ref 150–400)
RBC: 3.34 MIL/uL — ABNORMAL LOW (ref 4.22–5.81)
RDW: 19.8 % — ABNORMAL HIGH (ref 11.5–15.5)
WBC: 8.6 10*3/uL (ref 4.0–10.5)
nRBC: 0.7 % — ABNORMAL HIGH (ref 0.0–0.2)

## 2018-03-18 LAB — URINALYSIS, ROUTINE W REFLEX MICROSCOPIC
Bacteria, UA: NONE SEEN
Bilirubin Urine: NEGATIVE
Glucose, UA: NEGATIVE mg/dL
Ketones, ur: NEGATIVE mg/dL
Leukocytes, UA: NEGATIVE
Nitrite: NEGATIVE
Protein, ur: NEGATIVE mg/dL
Specific Gravity, Urine: 1.012 (ref 1.005–1.030)
pH: 7 (ref 5.0–8.0)

## 2018-03-18 LAB — RETICULOCYTES
Immature Retic Fract: 26.7 % — ABNORMAL HIGH (ref 2.3–15.9)
RBC.: 3.34 MIL/uL — ABNORMAL LOW (ref 4.22–5.81)
Retic Count, Absolute: 231.1 10*3/uL — ABNORMAL HIGH (ref 19.0–186.0)
Retic Ct Pct: 6.9 % — ABNORMAL HIGH (ref 0.4–3.1)

## 2018-03-18 LAB — COMPREHENSIVE METABOLIC PANEL
ALT: 11 U/L (ref 0–44)
AST: 23 U/L (ref 15–41)
Albumin: 3.8 g/dL (ref 3.5–5.0)
Alkaline Phosphatase: 56 U/L (ref 38–126)
Anion gap: 11 (ref 5–15)
BUN: 5 mg/dL — ABNORMAL LOW (ref 6–20)
CO2: 24 mmol/L (ref 22–32)
Calcium: 9.3 mg/dL (ref 8.9–10.3)
Chloride: 104 mmol/L (ref 98–111)
Creatinine, Ser: 0.58 mg/dL — ABNORMAL LOW (ref 0.61–1.24)
GFR calc Af Amer: >60 mL/min (ref >60)
GFR calc non Af Amer: >60 mL/min (ref >60)
Glucose, Bld: 83 mg/dL (ref 70–99)
Potassium: 3.1 mmol/L — ABNORMAL LOW (ref 3.5–5.1)
Sodium: 139 mmol/L (ref 135–145)
Total Bilirubin: 2.3 mg/dL — ABNORMAL HIGH (ref 0.3–1.2)
Total Protein: 7.2 g/dL (ref 6.5–8.1)

## 2018-03-18 MED ORDER — HYDROMORPHONE HCL 1 MG/ML IJ SOLN
0.5000 mg | Freq: Once | INTRAMUSCULAR | Status: AC
  Administered 2018-03-18: 0.5 mg via INTRAVENOUS
  Filled 2018-03-18: qty 1

## 2018-03-18 MED ORDER — SODIUM CHLORIDE 0.45 % IV SOLN
INTRAVENOUS | Status: DC
  Administered 2018-03-18: 10:00:00 via INTRAVENOUS

## 2018-03-18 MED ORDER — HYDROMORPHONE HCL 1 MG/ML IJ SOLN: 1.0000 mg | INTRAMUSCULAR | Status: DC

## 2018-03-18 MED ORDER — HYDROMORPHONE HCL 1 MG/ML IJ SOLN
1.0000 mg | Freq: Once | INTRAMUSCULAR | Status: AC
  Administered 2018-03-18: 1 mg via INTRAVENOUS
  Filled 2018-03-18: qty 1

## 2018-03-18 MED ORDER — POTASSIUM CHLORIDE CRYS ER 20 MEQ PO TBCR
40.0000 meq | EXTENDED_RELEASE_TABLET | Freq: Once | ORAL | Status: AC
  Administered 2018-03-18: 40 meq via ORAL
  Filled 2018-03-18: qty 2

## 2018-03-18 NOTE — ED Triage Notes (Signed)
Pt reports he has had lower back pain with left sided pain for a few days now. Pt reports that it feels like pain from his sickle cell. Pt denies any injuries to the area.

## 2018-03-18 NOTE — ED Notes (Signed)
Patient verbalizes understanding of discharge instructions. Opportunity for questioning and answers were provided. Armband removed by staff, pt discharged from ED.  

## 2018-03-18 NOTE — ED Provider Notes (Signed)
[facility] EMERGENCY DEPARTMENT Provider Note   CSN: [id number] Arrival date & time: 03/18/18  7169     History   Chief Complaint Chief Complaint  Patient presents with  . Back Pain    HPI Timothy Marks is a 28 y.o. male with history of depression, sickle cell anemia, chronic pain syndrome, osteonecrosis of the shoulder, presents for evaluation of cute onset, intermittent left-sided low back pain for the past "few days".  Pain is sharp, worsens with movement.  No alleviating factors noted.  Does not radiate.  He has taken 2 tablets of Percocet without relief of his symptoms.  Denies injury, fevers, numbness, weakness, saddle anesthesia, IV drug use, or bowel or bladder incontinence.  He denies chest pain, shortness of breath, abdominal pain, nausea, vomiting, or urinary symptoms.  Reports his pain feels consistent with his usual sickle cell pain crises.  The history is provided by the patient.    Past Medical History:  Diagnosis Date  . Depression   . 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 Sickle cell anemia ([facility])   . Vitamin D deficiency y-6    Patient Active Problem List   Diagnosis Date Noted  . Acute bilateral low back pain without sciatica   . Other chronic pain   . Anemia of chronic disease   . 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 Physical deconditioning   . Agitation   . Chronic pain syndrome   . Recurrent cold sores   . Fever   . Pain   . Sickle cell crisis ([place]) 07/16/2017  . Adjustment disorder with mixed disturbance of emotions and conduct   . Constipation 08/03/2014  . 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 Protein-calorie malnutrition, severe ([place]) 04/09/2014  . Major depressive disorder, recurrent, severe without psychotic features ([place])   . 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 Vitamin D deficiency   . Sickle cell anemia ([place]) 03/30/2014  . Hyperbilirubinemia  03/30/2014  . Hypokalemia 02/07/2014  . Aggressive behavior 01/25/2013  . Sickle cell pain crisis ([place]) 01/24/2013    Past Surgical History:  Procedure Laterality Date  . CHOLECYSTECTOMY          Home Medications    Prior to Admission medications   Medication Sig Start Date End Date Taking? Authorizing Provider  aspirin EC 81 MG tablet Take 1 tablet (81 mg total) by mouth daily. 06/02/17  Yes Dorena Dew, FNP  cholecalciferol (VITAMIN D) 1000 units tablet Take 2 tablets (2,000 Units total) by mouth daily. 12/13/17  Yes Azzie Glatter, FNP  folic acid (FOLVITE) 1 MG tablet Take 1 tablet (1 mg total) by mouth daily. 05/01/17  Yes Dorena Dew, FNP  gabapentin (NEURONTIN) 300 MG capsule Take 1 capsule (300 mg total) by mouth 3 (three) times daily. For 30 days. 01/16/18 03/18/18 Yes Lanae Boast, FNP  hydroxyurea (HYDREA) 500 MG capsule Take 1 capsule (500 mg total) by mouth 2 (two) times daily. May take with food to minimize GI side effects. 05/01/17  Yes Dorena Dew, FNP  Lidocaine 4 % PTCH Place 1 patch onto the skin daily. 02/15/18  Yes [provider]  oxyCODONE (OXYCONTIN) 15 mg 12 hr tablet Take 1 tablet (15 mg total) by mouth every 8 (eight) hours for 30 days. 03/04/18 04/03/18 Yes Lanae Boast, FNP  oxyCODONE-acetaminophen (PERCOCET) 10-325 MG tablet Take 1 tablet by mouth every 6 (six) hours as needed  for up to 18 days for pain. 03/15/18 04/02/18 Yes Lanae Boast, FNP  ELIQUIS STARTER PACK (ELIQUIS STARTER PACK) 5 MG TABS Take as directed on package: start with two-5mg  tablets twice daily for 7 days. On day 8, switch to one-5mg  tablet twice daily. Patient not taking: Reported on 03/18/2018 12/13/17   Azzie Glatter, FNP  ibuprofen (ADVIL,MOTRIN) 800 MG tablet Take 1 tablet (800 mg total) by mouth 3 (three) times daily as needed (pain). Patient not taking: Reported on 03/18/2018 12/20/17   Azzie Glatter, FNP    Family History Family History  Problem  Relation Age of Onset  . Sickle cell trait Mother   . Sickle cell trait Father     Social History Social History   Tobacco Use  . Smoking status: Never Smoker  . Smokeless tobacco: Never Used  Substance Use Topics  . Alcohol use: No  . Drug use: No    Types: Marijuana    Comment: denies use 05/01/2017     Allergies   Buprenorphine hcl; Levaquin [levofloxacin]; Meperidine; Morphine and related; Toradol [ketorolac tromethamine]; Tramadol; Vancomycin; and Zosyn [piperacillin sod-tazobactam so]   Review of Systems Review of Systems  Constitutional: Negative for chills and fever.  Respiratory: Negative for shortness of breath.   Cardiovascular: Negative for chest pain.  Gastrointestinal: Negative for abdominal pain, nausea and vomiting.  Musculoskeletal: Positive for back pain.  Neurological: Negative for weakness and numbness.  All other systems reviewed and are negative.    Physical Exam Updated Vital Signs BP 120/79   Pulse 75   Temp 98.5 F (36.9 C) (Oral)   Resp 15   Ht 6\' 2"  (1.88 m)   Wt 56.2 kg   SpO2 100%   BMI 15.92 kg/m   Physical Exam Vitals signs and nursing note reviewed.  Constitutional:      General: He is not in acute distress.    Appearance: He is well-developed.  HENT:     Head: Normocephalic and atraumatic.  Eyes:     General:        Right eye: No discharge.        Left eye: No discharge.     Conjunctiva/sclera: Conjunctivae normal.  Neck:     Musculoskeletal: Normal range of motion and neck supple.     Vascular: No JVD.     Trachea: No tracheal deviation.  Cardiovascular:     Rate and Rhythm: Normal rate.     Pulses: Normal pulses.     Comments: 2+ radial and DP/PT pulses bilaterally, no lower extremity edema, Homans sign absent bilaterally Pulmonary:     Effort: Pulmonary effort is normal.     Breath sounds: Normal breath sounds.  Chest:     Chest wall: Tenderness present.       Comments: Equal rise and fall of chest, no  increased work of breathing.  Speaking in full sentences without difficulty.  Lung sounds equal bilaterally.  He has tenderness to palpation of the left inferior anterior lateral aspect of the chest wall with no deformity, crepitus, ecchymosis, or flail segment. Abdominal:     General: Abdomen is flat. There is no distension.     Tenderness: There is no abdominal tenderness. There is no right CVA tenderness, left CVA tenderness, guarding or rebound.  Musculoskeletal:     Comments: Diffuse lumbar spine tenderness primarily along the left paralumbar musculature and lumbar spine.  No focal tenderness.  No deformity, crepitus, or step-off noted.  5/5 strength of BLE  major muscle groups.  Negative straight leg raise bilaterally.  Skin:    Findings: No erythema.  Neurological:     Mental Status: He is alert.  Psychiatric:        Behavior: Behavior normal.      ED Treatments / Results  Labs (all labs ordered are listed, but only abnormal results are displayed) Labs Reviewed  CBC WITH DIFFERENTIAL/PLATELET - Abnormal; Notable for the following components:      Result Value   RBC 3.34 (*)    Hemoglobin 9.9 (*)    HCT 28.2 (*)    RDW 19.8 (*)    Platelets 428 (*)    nRBC 0.7 (*)    All other components within normal limits  URINALYSIS, ROUTINE W REFLEX MICROSCOPIC - Abnormal; Notable for the following components:   Hgb urine dipstick SMALL (*)    All other components within normal limits  COMPREHENSIVE METABOLIC PANEL - Abnormal; Notable for the following components:   Potassium 3.1 (*)    BUN 5 (*)    Creatinine, Ser 0.58 (*)    Total Bilirubin 2.3 (*)    All other components within normal limits  RETICULOCYTES - Abnormal; Notable for the following components:   Retic Ct Pct 6.9 (*)    RBC. 3.34 (*)    Retic Count, Absolute 231.1 (*)    Immature Retic Fract 26.7 (*)    All other components within normal limits    EKG None  Radiology No results found.  Procedures Procedures  (including critical care time)  Medications Ordered in ED Medications  0.45 % sodium chloride infusion ( Intravenous New Bag/Given 03/18/18 1009)  potassium chloride SA (K-DUR,KLOR-CON) CR tablet 40 mEq (has no administration in time range)  HYDROmorphone (DILAUDID) injection 0.5 mg (0.5 mg Intravenous Given 03/18/18 0928)  HYDROmorphone (DILAUDID) injection 1 mg (1 mg Intravenous Given 03/18/18 1007)  HYDROmorphone (DILAUDID) injection 1 mg (1 mg Intravenous Given 03/18/18 1142)     Initial Impression / Assessment and Plan / ED Course  I have reviewed the triage vital signs and the nursing notes.  Pertinent labs & imaging results that were available during my care of the patient were reviewed by me and considered in my medical decision making (see chart for details).     Patient presenting for evaluation of left-sided low back pain.  He is afebrile, vital signs are stable.  He is nontoxic in appearance.  He is neurovascularly intact.  No red flag signs concerning for cauda equina or spinal abscess.  Ambulatory without difficulty.  Reports this is consistent with his usual sickle cell pain crisis.  Lab work reviewed by me shows no leukocytosis.  He is anemic, at around his baseline.  Does not require emergent transfusion.  Mildly hypokalemic, replenished orally in the ED.  Bilirubin is elevated but is baseline for the patient.  Reticulocyte count elevated, consistent with sickle cell anemia with compensation.  No evidence of acute chest syndrome, CVA, splenic sequestration crisis, or aplastic anemia.  He was given IV fluids and pain medicine in the ED with significant improvement and feels comfortable with discharge home.  On reassessment he is tolerating ginger ale and sandwich, texting in bed in no apparent distress.  He will follow-up with his PCP for reevaluation of his symptoms.  Advised that he can also go to the sickle cell patient care center during business hours if his symptoms persist.   Discussed strict ED return precautions. Pt verbalized understanding of and agreement with  plan and is safe for discharge home at this time.  Final Clinical Impressions(s) / ED Diagnoses   Final diagnoses:  Sickle cell pain crisis ([place])  Acute left-sided low back pain without sciatica  Hypokalemia    ED Discharge Orders    None       Debroah Baller 03/18/18 1235    Gareth Morgan, MD 03/19/18 [phone]

## 2018-03-18 NOTE — Discharge Instructions (Signed)
Take your home medicines as prescribed.  Apply some heat or ice, whichever feels best, 20 minutes at a time 3 times daily as needed for pain.  Do some gentle stretching to avoid muscle stiffness.  Hot showers and hot baths may also be helpful.  Follow-up with your primary care physician for reevaluation of your symptoms. Return to the emergency department if any concerning signs or symptoms develop such as numbness, weakness, incontinence, chest pain, shortness of breath, or fevers.

## 2018-03-18 NOTE — ED Notes (Signed)
Spoke to pt about need for urine sample, urinal at bedside

## 2018-03-18 NOTE — ED Notes (Signed)
Patient is ambulatory on discharge from ED

## 2018-03-21 ENCOUNTER — Telehealth: Payer: Self-pay | Admitting: Family Medicine

## 2018-03-21 DIAGNOSIS — F1190 Opioid use, unspecified, uncomplicated: Secondary | ICD-10-CM

## 2018-03-21 NOTE — Telephone Encounter (Signed)
Called patient and informed him that his UDS showed no opioids in his system. Instructed patient to come in for pill count and UDS. Instructed patient to bring in all narcotics to this visit. Patient verbalized understanding and agreed with plan of care.

## 2018-03-22 ENCOUNTER — Telehealth: Payer: Self-pay | Admitting: Family Medicine

## 2018-03-22 NOTE — Telephone Encounter (Signed)
Patient did not show for random UDS or pill count on 03/21/2018. Patient did not contact the office for reason for canceling. No further medications will be filled by this provider until he is compliant with the rules and regulations of the controlled substance agreement.

## 2018-03-26 ENCOUNTER — Encounter: Payer: Self-pay | Admitting: Family Medicine

## 2018-03-26 ENCOUNTER — Ambulatory Visit (INDEPENDENT_AMBULATORY_CARE_PROVIDER_SITE_OTHER): Payer: Medicaid Other | Admitting: Family Medicine

## 2018-03-26 ENCOUNTER — Non-Acute Institutional Stay (HOSPITAL_COMMUNITY)
Admission: AD | Admit: 2018-03-26 | Discharge: 2018-03-26 | Disposition: A | Discharge status: Home / Self Care | Payer: Medicaid Other | Source: Ambulatory Visit | Attending: Internal Medicine | Admitting: Internal Medicine

## 2018-03-26 ENCOUNTER — Encounter (HOSPITAL_COMMUNITY): Payer: Self-pay | Admitting: General Practice

## 2018-03-26 VITALS — BP 133/69 | HR 70 | Temp 98.4°F | Resp 14 | Ht 74.0 in | Wt 127.0 lb

## 2018-03-26 DIAGNOSIS — Z86718 Personal history of other venous thrombosis and embolism: Secondary | ICD-10-CM | POA: Insufficient documentation

## 2018-03-26 DIAGNOSIS — M549 Dorsalgia, unspecified: Secondary | ICD-10-CM | POA: Diagnosis present

## 2018-03-26 DIAGNOSIS — Z79899 Other long term (current) drug therapy: Secondary | ICD-10-CM | POA: Diagnosis not present

## 2018-03-26 DIAGNOSIS — G894 Chronic pain syndrome: Secondary | ICD-10-CM | POA: Diagnosis not present

## 2018-03-26 DIAGNOSIS — M79669 Pain in unspecified lower leg: Secondary | ICD-10-CM | POA: Diagnosis present

## 2018-03-26 DIAGNOSIS — Z791 Long term (current) use of non-steroidal anti-inflammatories (NSAID): Secondary | ICD-10-CM | POA: Insufficient documentation

## 2018-03-26 DIAGNOSIS — Z7982 Long term (current) use of aspirin: Secondary | ICD-10-CM | POA: Insufficient documentation

## 2018-03-26 DIAGNOSIS — Z86711 Personal history of pulmonary embolism: Secondary | ICD-10-CM | POA: Diagnosis not present

## 2018-03-26 DIAGNOSIS — D5700 Hb-SS disease with crisis, unspecified: Secondary | ICD-10-CM | POA: Diagnosis present

## 2018-03-26 DIAGNOSIS — Z79891 Long term (current) use of opiate analgesic: Secondary | ICD-10-CM | POA: Diagnosis not present

## 2018-03-26 LAB — RETICULOCYTES
Immature Retic Fract: 28.9 % — ABNORMAL HIGH (ref 2.3–15.9)
RBC.: 2.88 MIL/uL — ABNORMAL LOW (ref 4.22–5.81)
Retic Count, Absolute: 320 10*3/uL — ABNORMAL HIGH (ref 19.0–186.0)
Retic Ct Pct: 11.1 % — ABNORMAL HIGH (ref 0.4–3.1)

## 2018-03-26 LAB — CBC WITH DIFFERENTIAL/PLATELET
Abs Immature Granulocytes: 0.09 10*3/uL — ABNORMAL HIGH (ref 0.00–0.07)
Basophils Absolute: 0 10*3/uL (ref 0.0–0.1)
Basophils Relative: 0 %
Eosinophils Absolute: 0.4 10*3/uL (ref 0.0–0.5)
Eosinophils Relative: 3 %
HCT: 25.5 % — ABNORMAL LOW (ref 39.0–52.0)
Hemoglobin: 8.6 g/dL — ABNORMAL LOW (ref 13.0–17.0)
Immature Granulocytes: 1 %
Lymphocytes Relative: 22 %
Lymphs Abs: 3.1 10*3/uL (ref 0.7–4.0)
MCH: 29.9 pg (ref 26.0–34.0)
MCHC: 33.7 g/dL (ref 30.0–36.0)
MCV: 88.5 fL (ref 80.0–100.0)
Monocytes Absolute: 0.7 10*3/uL (ref 0.1–1.0)
Monocytes Relative: 5 %
Neutro Abs: 9.8 10*3/uL — ABNORMAL HIGH (ref 1.7–7.7)
Neutrophils Relative %: 69 %
Platelets: 505 10*3/uL — ABNORMAL HIGH (ref 150–400)
RBC: 2.88 MIL/uL — ABNORMAL LOW (ref 4.22–5.81)
RDW: 22.5 % — ABNORMAL HIGH (ref 11.5–15.5)
WBC: 14.1 10*3/uL — ABNORMAL HIGH (ref 4.0–10.5)
nRBC: 1.4 % — ABNORMAL HIGH (ref 0.0–0.2)

## 2018-03-26 LAB — POCT URINALYSIS DIPSTICK
Bilirubin, UA: NEGATIVE
Glucose, UA: NEGATIVE
Ketones, UA: NEGATIVE
Leukocytes, UA: NEGATIVE
Nitrite, UA: NEGATIVE
Protein, UA: NEGATIVE
Spec Grav, UA: 1.01 (ref 1.010–1.025)
Urobilinogen, UA: 2 E.U./dL — AB
pH, UA: 7.5 (ref 5.0–8.0)

## 2018-03-26 LAB — COMPREHENSIVE METABOLIC PANEL
ALT: 20 U/L (ref 0–44)
AST: 29 U/L (ref 15–41)
Albumin: 4 g/dL (ref 3.5–5.0)
Alkaline Phosphatase: 68 U/L (ref 38–126)
Anion gap: 6 (ref 5–15)
BUN: 5 mg/dL — ABNORMAL LOW (ref 6–20)
CO2: 29 mmol/L (ref 22–32)
Calcium: 8.4 mg/dL — ABNORMAL LOW (ref 8.9–10.3)
Chloride: 105 mmol/L (ref 98–111)
Creatinine, Ser: 0.49 mg/dL — ABNORMAL LOW (ref 0.61–1.24)
GFR calc Af Amer: >60 mL/min (ref >60)
GFR calc non Af Amer: >60 mL/min (ref >60)
Glucose, Bld: 84 mg/dL (ref 70–99)
Potassium: 3.2 mmol/L — ABNORMAL LOW (ref 3.5–5.1)
Sodium: 140 mmol/L (ref 135–145)
Total Bilirubin: 4 mg/dL — ABNORMAL HIGH (ref 0.3–1.2)
Total Protein: 7 g/dL (ref 6.5–8.1)

## 2018-03-26 MED ORDER — SODIUM CHLORIDE 0.9 % IV SOLN
25.0000 mg | INTRAVENOUS | Status: DC | PRN
  Filled 2018-03-26: qty 0.5

## 2018-03-26 MED ORDER — HYDROMORPHONE 1 MG/ML IV SOLN
INTRAVENOUS | Status: DC
  Administered 2018-03-26: 30 mg via INTRAVENOUS
  Administered 2018-03-26: 8 mg via INTRAVENOUS
  Filled 2018-03-26: qty 30

## 2018-03-26 MED ORDER — KETOROLAC TROMETHAMINE 15 MG/ML IJ SOLN: 15.0000 mg | Freq: Four times a day (QID) | INTRAMUSCULAR | Status: DC

## 2018-03-26 MED ORDER — DIPHENHYDRAMINE HCL 25 MG PO CAPS
25.0000 mg | ORAL_CAPSULE | ORAL | Status: DC | PRN
  Administered 2018-03-26: 25 mg via ORAL
  Filled 2018-03-26: qty 1

## 2018-03-26 MED ORDER — NALOXONE HCL 0.4 MG/ML IJ SOLN: 0.4000 mg | INTRAMUSCULAR | Status: DC | PRN

## 2018-03-26 MED ORDER — ONDANSETRON HCL 4 MG/2ML IJ SOLN
4.0000 mg | Freq: Four times a day (QID) | INTRAMUSCULAR | Status: DC | PRN
  Administered 2018-03-26: 4 mg via INTRAVENOUS
  Filled 2018-03-26: qty 2

## 2018-03-26 MED ORDER — HYDROMORPHONE HCL 2 MG/ML IJ SOLN: 2.0000 mg | Freq: Once | INTRAMUSCULAR | Status: DC

## 2018-03-26 MED ORDER — SODIUM CHLORIDE 0.9% FLUSH: 9.0000 mL | INTRAVENOUS | Status: DC | PRN

## 2018-03-26 MED ORDER — DEXTROSE-NACL 5-0.45 % IV SOLN
INTRAVENOUS | Status: DC
  Administered 2018-03-26: 14:00:00 via INTRAVENOUS

## 2018-03-26 NOTE — Progress Notes (Signed)
PATIENT CARE CENTER INTERNAL MEDICINE AND SICKLE CELL CARE  SICKLE CELL ANEMIA FOLLOW UP VISIT PROVIDER: Lanae Boast, FNP    Subjective:   Timothy Marks  is a 28 y.o.  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 presents for a follow up for Sickle Cell Anemia. The patient has had 3 admissions in the past 6 months.  Pain regimen includes: Ibuprofen, oxycontin, percocet.  Hydrea Therapy: Yes Medication compliance: Yes  Pain today is 8/10 and is in the left lower extremity Patient without opiates in his system on the last UDS. Patient called on 03/21/2018 and asked to come in for a pill count and random UDS. Patient did not come in for that visit.   Review of Systems  Constitutional: Negative.   HENT: Negative.   Eyes: Negative.   Respiratory: Negative.   Cardiovascular: Negative.   Gastrointestinal: Negative.   Genitourinary: Negative.   Musculoskeletal: Positive for back pain, joint pain and myalgias.  Skin: Negative.   Neurological: Negative.   Psychiatric/Behavioral: Negative.     Objective:   Objective  BP 133/69 (BP Location: Left Arm, Patient Position: Sitting, Cuff Size: Normal)   Pulse 70   Temp 98.4 F (36.9 C) (Oral)   Resp 14   Ht 6\' 2"  (1.88 m)   Wt 127 lb (57.6 kg)   SpO2 100%   BMI 16.31 kg/m   Wt Readings from Last 3 Encounters:  03/26/18 127 lb (57.6 kg)  03/18/18 124 lb (56.2 kg)  02/28/18 124 lb (56.2 kg)     Physical Exam Vitals signs and nursing note reviewed.  Constitutional:      General: He is not in acute distress.    Appearance: He is well-developed.  HENT:     Head: Normocephalic and atraumatic.  Eyes:     Conjunctiva/sclera: Conjunctivae normal.     Pupils: Pupils are equal, round, and reactive to light.  Neck:     Musculoskeletal: Normal range of motion.  Cardiovascular:     Rate and  Rhythm: Normal rate and regular rhythm.     Heart sounds: Murmur present.  Pulmonary:     Effort: Pulmonary effort is normal. No respiratory distress.     Breath sounds: Normal breath sounds.  Abdominal:     General: Bowel sounds are normal. There is no distension.     Palpations: Abdomen is soft.  Musculoskeletal: Normal range of motion.  Skin:    General: Skin is warm and dry.  Neurological:     Mental Status: He is alert and oriented to person, place, and time.  Psychiatric:        Mood and Affect: Mood normal.        Behavior: Behavior normal.        Thought Content: Thought content normal.        Judgment: Judgment normal.      Assessment/Plan:   Assessment   Encounter Diagnosis  Name Primary?  . Sickle cell pain crisis ([place]) Yes     Plan  1. Sickle cell pain crisis [facility]) Patient sent to the day hospital for further evaluation of pain. Patient received opiates in the ED. He did not bring his medications for the pill count. Patient will need to be compliant before receiving further narcotic pain medication.  - [id number] 11+Oxyco+Alc+Crt-Bund - Urinalysis Dipstick   Return to care as scheduled and prn. Patient  verbalized understanding and agreed with plan of care.   1. Sickle cell disease - Continue Hydrea   We discussed the need for good hydration, monitoring of hydration status, avoidance of heat, cold, stress, and infection triggers. We discussed the risks and benefits of Hydrea, including bone marrow suppression, the possibility of GI upset, skin ulcers, hair thinning, and teratogenicity. The patient was reminded of the need to seek medical attention of any symptoms of bleeding, anemia, or infection. Continue folic acid 1 mg daily to prevent aplastic bone marrow crises.   2. Pulmonary evaluation - Patient denies severe recurrent wheezes, shortness of breath with exercise, or persistent cough. If these symptoms develop, pulmonary function tests with spirometry will be  ordered, and if abnormal, plan on referral to Pulmonology for further evaluation.  3. Cardiac - Routine screening for pulmonary hypertension is not recommended.  4. Eye - High risk of proliferative retinopathy. Annual eye exam with retinal exam recommended to patient.  5. Immunization status -  Yearly influenza vaccination is recommended, as well as being up to date with Meningococcal and Pneumococcal vaccines.   6. Acute and chronic painful episodes - We discussed that pt is to receive Schedule II prescriptions only from [place]. Pt is also aware that the prescription history is available to [place] online through the [facility] CSRS. Controlled substance agreement signed. We reminded Melquan Ernsberger that all patients receiving Schedule II narcotics must be seen for follow within one month of prescription being requested. We reviewed the terms of our pain agreement, including the need to keep medicines in a safe locked location away from children or pets, and the need to report excess sedation or constipation, measures to avoid constipation, and policies related to early refills and stolen prescriptions. According to the [place] [facility] [employer] program, we have reviewed details related to analgesia, adverse effects, aberrant behaviors.  7. Iron overload from chronic transfusion.  Not applicable at this time.  If this occurs will use Exjade for management.   8. Vitamin D deficiency - Drisdol 50,000 units weekly. Patient encouraged to take as prescribed.   The above recommendations are taken from the NIH Evidence-Based Management of Sickle Cell Disease: Expert Panel Report, 20149.   [name], FNP-BC Patient [facility] [address] [place],  [id number] [phone]  This note has been created with Dragon speech recognition software and smart phrase technology. Any transcriptional errors are unintentional.

## 2018-03-26 NOTE — Discharge Summary (Signed)
Sickle [facility] Discharge Summary   Patient ID: Timothy Marks MRN: [id number] DOB/AGE: March 09, 1990 28 y.o.  Admit date: 03/26/2018 Discharge date: 03/26/2018  Primary Care Physician:  Lanae Boast, [place]  Admission Diagnoses:  Active Problems:   Sickle cell pain crisis [facility])  Discharge Medications:  Allergies as of 03/26/2018      Reactions   Buprenorphine Hcl Hives   Levaquin [levofloxacin] Itching   Meperidine Rash   Morphine And Related Hives   Toradol [ketorolac Tromethamine] Itching   Tramadol Hives   Vancomycin Itching   Zosyn [piperacillin Sod-tazobactam So] Itching, Rash   Has taken rocephin in past      Medication List    TAKE these medications   aspirin EC 81 MG tablet Take 1 tablet (81 mg total) by mouth daily.   cholecalciferol 25 MCG (1000 UT) tablet Commonly known as:  VITAMIN D Take 2 tablets (2,000 Units total) by mouth daily.   ELIQUIS DVT/PE STARTER PACK 5 MG Tabs Take as directed on package: start with two-5mg  tablets twice daily for 7 days. On day 8, switch to one-5mg  tablet twice daily.   folic acid 1 MG tablet Commonly known as:  FOLVITE Take 1 tablet (1 mg total) by mouth daily.   gabapentin 300 MG capsule Commonly known as:  NEURONTIN Take 1 capsule (300 mg total) by mouth 3 (three) times daily. For 30 days.   hydroxyurea 500 MG capsule Commonly known as:  HYDREA Take 1 capsule (500 mg total) by mouth 2 (two) times daily. May take with food to minimize GI side effects.   ibuprofen 800 MG tablet Commonly known as:  ADVIL,MOTRIN Take 1 tablet (800 mg total) by mouth 3 (three) times daily as needed (pain).   Lidocaine 4 % Ptch Place 1 patch onto the skin daily.   oxyCODONE 15 mg 12 hr tablet Commonly known as:  OXYCONTIN Take 1 tablet (15 mg total) by mouth every 8 (eight) hours for 30 days.   oxyCODONE-acetaminophen 10-325 MG tablet Commonly known as:  PERCOCET Take 1 tablet by mouth every 6 (six) hours as needed for up  to 18 days for pain.        Consults:  None  Significant Diagnostic Studies:  No results found.   [facility] Course: Timothy Marks, a 28 year old male with a medical history significant for sickle cell anemia and chronic pain syndrome was admitted in sickle cell pain crisis. Patient transitioned from primary care in stable condition.  Patient hemoglobin 8.6, which is consistent with patient's baseline.  Patient afebrile and maintaining oxygen saturation above 90% on RA.   D5.45% saline at 125 ml/hr Dilaudid PCA per weight based protocol. Patient used a total of 8 mg with 15 demands and 14 delivered. Pain intensity decreased to 5 out of 10.  Patient alert, oriented, and ambulating without assistance.  Patient will discharge home with family in a hemodynamically stable condition.  Discharge instructions: Resume all home medications Follow-up in primary care as scheduled Avoid all stressors that precipitate sickle cell pain crisis Strict return precautions   The patient was given clear instructions to go to ER or return to medical center if symptoms do not improve, worsen or new problems develop.     Physical Exam at Discharge:  BP 123/73 (BP Location: Left Arm)   Pulse 86   Temp 98.7 F (37.1 C) (Oral)   Resp 16   SpO2 97%   Physical Exam Constitutional:  Appearance: Normal appearance.  HENT:     Head: Normocephalic and atraumatic.     Nose: Nose normal.     Mouth/Throat:     Mouth: Mucous membranes are moist.  Neck:     Musculoskeletal: Normal range of motion.  Cardiovascular:     Rate and Rhythm: Normal rate and regular rhythm.  Pulmonary:     Effort: Pulmonary effort is normal.     Breath sounds: Normal breath sounds.  Abdominal:     General: Abdomen is flat.  Skin:    General: Skin is warm and dry.  Neurological:     General: No focal deficit present.     Mental Status: He is alert and oriented to person, place, and time. Mental  status is at baseline.  Psychiatric:        Mood and Affect: Mood normal.        Thought Content: Thought content normal.        Judgment: Judgment normal.      Disposition at Discharge: Discharge disposition: 01-Home or Self Care       Discharge Orders: Discharge Instructions    Discharge patient   Complete by:  As directed    Discharge disposition:  01-Home or Self Care   Discharge patient date:  03/26/2018      Condition at Discharge:   Stable  Time spent on Discharge:  20 minutes   Signed:  [name]  APRN, MSN, FNP-C Patient [facility] [address] [place], [place] [address] [phone]  03/26/2018, 5:08 PM

## 2018-03-26 NOTE — Discharge Instructions (Signed)
Sickle Cell Anemia, Adult °Sickle cell anemia is a condition where your red blood cells are shaped like sickles. Red blood cells carry oxygen through the body. Sickle-shaped cells do not live as long as normal red blood cells. They also clump together and block blood from flowing through the blood vessels. This prevents the body from getting enough oxygen. Sickle cell anemia causes organ damage and pain. It also increases the risk of infection. °Follow these instructions at home: °Medicines °· Take over-the-counter and prescription medicines only as told by your doctor. °· If you were prescribed an antibiotic medicine, take it as told by your doctor. Do not stop taking the antibiotic even if you start to feel better. °· If you develop a fever, do not take medicines to lower the fever right away. Tell your doctor about the fever. °Managing pain, stiffness, and swelling °· Try these methods to help with pain: °? Use a heating pad. °? Take a warm bath. °? Distract yourself, such as by watching TV. °Eating and drinking °· Drink enough fluid to keep your pee (urine) clear or pale yellow. Drink more in hot weather and during exercise. °· Limit or avoid alcohol. °· Eat a healthy diet. Eat plenty of fruits, vegetables, whole grains, and lean protein. °· Take vitamins and supplements as told by your doctor. °Traveling °· When traveling, keep these with you: °? Your medical information. °? The names of your doctors. °? Your medicines. °· If you need to take an airplane, talk to your doctor first. °Activity °· Rest often. °· Avoid exercises that make your heart beat much faster, such as jogging. °General instructions °· Do not use products that have nicotine or tobacco, such as cigarettes and e-cigarettes. If you need help quitting, ask your doctor. °· Consider wearing a medical alert bracelet. °· Avoid being in high places (high altitudes), such as mountains. °· Avoid very hot or cold temperatures. °· Avoid places where the  temperature changes a lot. °· Keep all follow-up visits as told by your doctor. This is important. °Contact a doctor if: °· A joint hurts. °· Your feet or hands hurt or swell. °· You feel tired (fatigued). °Get help right away if: °· You have symptoms of infection. These include: °? Fever. °? Chills. °? Being very tired. °? Irritability. °? Poor eating. °? Throwing up (vomiting). °· You feel dizzy or faint. °· You have new stomach pain, especially on the left side. °· You have a an erection (priapism) that lasts more than 4 hours. °· You have numbness in your arms or legs. °· You have a hard time moving your arms or legs. °· You have trouble talking. °· You have pain that does not go away when you take medicine. °· You are short of breath. °· You are breathing fast. °· You have a long-term cough. °· You have pain in your chest. °· You have a bad headache. °· You have a stiff neck. °· Your stomach looks bloated even though you did not eat much. °· Your skin is pale. °· You suddenly cannot see well. °Summary °· Sickle cell anemia is a condition where your red blood cells are shaped like sickles. °· Follow your doctor's advice on ways to manage pain, food to eat, activities to do, and steps to take for safe travel. °· Get medical help right away if you have any signs of infection, such as a fever. °This information is not intended to replace advice given to you by your   health care provider. Make sure you discuss any questions you have with your health care provider. °Document Released: 11/21/2012 Document Revised: 03/08/2016 Document Reviewed: 03/08/2016 °Elsevier Interactive Patient Education © 2019 [employer] Inc[employer] ° °

## 2018-03-26 NOTE — H&P (Signed)
Sickle [facility] History and Physical   Date: 03/26/2018  Patient name: Timothy Marks Medical record number: [id number] Date of birth: 1990-09-30 Age: 28 y.o. 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  Patient states that pain has been increasing over the past several days.  [facility] without sustained relief. Patient states that pain intensity is 8/10.  Currently denies headache, chest pain, dysuria, abdominal pain, nausea, vomiting, or diarrhea Meds: Medications Prior to Admission  Medication Sig Dispense Refill Last Dose  . aspirin EC 81 MG tablet Take 1 tablet (81 mg total) by mouth daily. 30 tablet 11 Taking  . cholecalciferol (VITAMIN D) 1000 units tablet Take 2 tablets (2,000 Units total) by mouth daily. 30 tablet 6 Taking  . ELIQUIS STARTER PACK (ELIQUIS STARTER PACK) 5 MG TABS Take as directed on package: start with two-5mg  tablets twice daily for 7 days. On day 8, switch to one-5mg  tablet twice daily. (Patient not taking: Reported on 03/18/2018) 1 each 0 Not Taking  . folic acid (FOLVITE) 1 MG tablet Take 1 tablet (1 mg total) by mouth daily. 90 tablet 2 Taking  . gabapentin (NEURONTIN) 300 MG capsule Take 1 capsule (300 mg total) by mouth 3 (three) times daily. For 30 days. 90 capsule 5 Taking  . hydroxyurea (HYDREA) 500 MG capsule Take 1 capsule (500 mg total) by mouth 2 (two) times daily. May take with food to minimize GI side effects. 180 capsule 2  Taking  . ibuprofen (ADVIL,MOTRIN) 800 MG tablet Take 1 tablet (800 mg total) by mouth 3 (three) times daily as needed (pain). 30 tablet 0 Taking  . Lidocaine 4 % PTCH Place 1 patch onto the skin daily.   Taking  . oxyCODONE (OXYCONTIN) 15 mg 12 hr tablet Take 1 tablet (15 mg total) by mouth every 8 (eight) hours for 30 days. 90 tablet 0 Taking  . oxyCODONE-acetaminophen (PERCOCET) 10-325 MG tablet Take 1 tablet by mouth every 6 (six) hours as needed for up to 18 days for pain. 72 tablet 0 Taking    Allergies: Buprenorphine hcl; Levaquin [levofloxacin]; Meperidine; Morphine and related; Toradol [ketorolac tromethamine]; Tramadol; Vancomycin; and Zosyn [piperacillin sod-tazobactam so] Past Medical History:  Diagnosis Date  . Depression   . 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 Sickle cell anemia ([facility])   . Vitamin D deficiency y-6   Past Surgical History:  Procedure Laterality Date  . CHOLECYSTECTOMY     Family History  Problem Relation Age of Onset  . Sickle cell trait Mother   . Sickle cell trait Father    Social History   Socioeconomic History  . Marital status: Single    Spouse name: Not on file  . Number of children: Not on file  . Years of education: Not on file  . Highest education level: Not on file  Occupational History  . Not on file  Social Needs  . Financial resource strain: Not on file  . Food insecurity:  Worry: Not on file    Inability: Not on file  . Transportation needs:    Medical: Not on file    Non-medical: Not on file  Tobacco Use  . Smoking status: Never Smoker  . Smokeless tobacco: Never Used  Substance and Sexual Activity  . Alcohol use: No  . Drug use: No    Types: Marijuana    Comment: denies use 05/01/2017  . Sexual activity: Not on file  Lifestyle  . Physical activity:    Days per week: Not on file    Minutes per session: Not on file  . Stress: Not on file  Relationships   . Social connections:    Talks on phone: Not on file    Gets together: Not on file    Attends religious service: Not on file    Active member of club or organization: Not on file    Attends meetings of clubs or organizations: Not on file    Relationship status: Not on file  . Intimate partner violence:    Fear of current or ex partner: Not on file    Emotionally abused: Not on file    Physically abused: Not on file    Forced sexual activity: Not on file  Other Topics Concern  . Not on file  Social History Narrative  . Not on file   Review of Systems  Constitutional: Negative.   HENT: Negative.   Eyes: Negative.   Respiratory: Negative.   Cardiovascular: Negative.   Genitourinary: Negative.   Musculoskeletal: Positive for back pain and joint pain.  Skin: Negative.   Neurological: Negative.   Endo/Heme/Allergies: Negative.   Psychiatric/Behavioral: Negative.     Physical Exam: There were no vitals taken for this visit. Physical Exam Constitutional:      Appearance: Normal appearance.  HENT:     Head: Normocephalic.     Nose: Nose normal.  Neck:     Musculoskeletal: Normal range of motion.  Cardiovascular:     Rate and Rhythm: Normal rate.     Pulses: Normal pulses.  Pulmonary:     Effort: Pulmonary effort is normal.     Breath sounds: Normal breath sounds.  Abdominal:     General: Abdomen is flat. Bowel sounds are normal.  Skin:    General: Skin is warm and dry.  Neurological:     General: No focal deficit present.     Mental Status: He is alert.  Psychiatric:        Mood and Affect: Mood normal.        Behavior: Behavior normal.        Thought Content: Thought content normal.        Judgment: Judgment normal.      Lab results: Results for orders placed or performed in visit on 03/26/18 (from the past 24 hour(s))  Urinalysis Dipstick     Status: Abnormal   Collection Time: 03/26/18 11:30 AM  Result Value Ref Range   Color, UA     Clarity, UA      Glucose, UA Negative Negative   Bilirubin, UA neg    Ketones, UA neg    Spec Grav, UA 1.010 1.010 - 1.025   Blood, UA trace    pH, UA 7.5 5.0 - 8.0   Protein, UA Negative Negative   Urobilinogen, UA 2.0 (A) 0.2 or 1.0 E.U./dL   Nitrite, UA neg    Leukocytes, UA Negative Negative   Appearance     Odor  Imaging results:  No results found.   Assessment & Plan:  Patient will be admitted to the day infusion center for extended observation  Start IV D5.45 for cellular rehydration at 125/hr  Start Toradol 30 mg IV every 6 hours for inflammation.  Start Dilaudid PCA High Concentration per weight based protocol.   Patient will be re-evaluated for pain intensity in the context of function and relationship to baseline as care progresses.  If no significant pain relief, will transfer patient to inpatient services for a higher level of care.   Review CMP,  LDH and CBC w/differential  [name]  APRN, MSN, FNP-C Patient [facility] [address] [place], [place] [address] [phone]  03/26/2018, 11:58 AM

## 2018-03-26 NOTE — Progress Notes (Signed)
Patient admitted to day hospital for treatment of sickle cell pain crisis. Patient reported pain rated 8/10 in the legs . Patient placed on Dilaudid PCA, given IV Zofran and hydrated with IV fluids. At discharge patient reported his  pain at 5/10. Discharge instructions given to patient. Patient alert, oriented and ambulatory at discharge.

## 2018-03-28 LAB — DRUG SCREEN 764883 11+OXYCO+ALC+CRT-BUND
Amphetamines, Urine: NEGATIVE ng/mL
BENZODIAZ UR QL: NEGATIVE ng/mL
Barbiturate: NEGATIVE ng/mL
Cocaine (Metabolite): NEGATIVE ng/mL
Creatinine: 17.2 mg/dL — ABNORMAL LOW (ref 20.0–300.0)
Ethanol: NEGATIVE %
Meperidine: NEGATIVE ng/mL
Methadone Screen, Urine: NEGATIVE ng/mL
OPIATE SCREEN URINE: NEGATIVE ng/mL
Oxycodone/Oxymorphone, Urine: NEGATIVE ng/mL
Phencyclidine: NEGATIVE ng/mL
Propoxyphene: NEGATIVE ng/mL
Tramadol: NEGATIVE ng/mL
pH, Urine: 7.2 (ref 4.5–8.9)

## 2018-03-28 LAB — CANNABINOID CONFIRMATION, UR
CANNABINOIDS: POSITIVE — AB
Carboxy THC GC/MS Conf: 131 ng/mL

## 2018-03-28 LAB — SPECIFIC GRAVITY: Specific Gravity: 1.0029

## 2018-04-01 ENCOUNTER — Encounter (HOSPITAL_COMMUNITY): Payer: Self-pay | Admitting: Nurse Practitioner

## 2018-04-01 ENCOUNTER — Emergency Department (HOSPITAL_COMMUNITY)
Admission: EM | Admit: 2018-04-01 | Discharge: 2018-04-01 | Disposition: A | Discharge status: Home / Self Care | Payer: Medicaid Other | Attending: Emergency Medicine | Admitting: Emergency Medicine

## 2018-04-01 ENCOUNTER — Emergency Department (HOSPITAL_COMMUNITY)
Admission: EM | Admit: 2018-04-01 | Discharge: 2018-04-01 | Disposition: A | Discharge status: Home / Self Care | Payer: Medicaid Other | Source: Home / Self Care | Attending: Emergency Medicine | Admitting: Emergency Medicine

## 2018-04-01 ENCOUNTER — Other Ambulatory Visit: Payer: Self-pay

## 2018-04-01 ENCOUNTER — Encounter (HOSPITAL_COMMUNITY): Payer: Self-pay | Admitting: Emergency Medicine

## 2018-04-01 DIAGNOSIS — D5700 Hb-SS disease with crisis, unspecified: Secondary | ICD-10-CM | POA: Insufficient documentation

## 2018-04-01 DIAGNOSIS — Z79899 Other long term (current) drug therapy: Secondary | ICD-10-CM

## 2018-04-01 DIAGNOSIS — M545 Low back pain: Secondary | ICD-10-CM | POA: Diagnosis present

## 2018-04-01 LAB — CBC WITH DIFFERENTIAL/PLATELET
Abs Immature Granulocytes: 0.16 10*3/uL — ABNORMAL HIGH (ref 0.00–0.07)
Basophils Absolute: 0 10*3/uL (ref 0.0–0.1)
Basophils Relative: 0 %
Eosinophils Absolute: 0.2 10*3/uL (ref 0.0–0.5)
Eosinophils Relative: 3 %
HCT: 30.2 % — ABNORMAL LOW (ref 39.0–52.0)
Hemoglobin: 10.2 g/dL — ABNORMAL LOW (ref 13.0–17.0)
Immature Granulocytes: 2 %
Lymphocytes Relative: 23 %
Lymphs Abs: 2.1 10*3/uL (ref 0.7–4.0)
MCH: 30.3 pg (ref 26.0–34.0)
MCHC: 33.8 g/dL (ref 30.0–36.0)
MCV: 89.6 fL (ref 80.0–100.0)
Monocytes Absolute: 0.6 10*3/uL (ref 0.1–1.0)
Monocytes Relative: 7 %
Neutro Abs: 5.8 10*3/uL (ref 1.7–7.7)
Neutrophils Relative %: 65 %
Platelets: 503 10*3/uL — ABNORMAL HIGH (ref 150–400)
RBC: 3.37 MIL/uL — ABNORMAL LOW (ref 4.22–5.81)
RDW: 21.2 % — ABNORMAL HIGH (ref 11.5–15.5)
WBC: 8.9 10*3/uL (ref 4.0–10.5)
nRBC: 3.9 % — ABNORMAL HIGH (ref 0.0–0.2)

## 2018-04-01 LAB — COMPREHENSIVE METABOLIC PANEL
ALT: 10 U/L (ref 0–44)
AST: 20 U/L (ref 15–41)
Albumin: 4.3 g/dL (ref 3.5–5.0)
Alkaline Phosphatase: 92 U/L (ref 38–126)
Anion gap: 7 (ref 5–15)
BUN: <5 mg/dL — ABNORMAL LOW (ref 6–20)
CO2: 27 mmol/L (ref 22–32)
Calcium: 9.1 mg/dL (ref 8.9–10.3)
Chloride: 105 mmol/L (ref 98–111)
Creatinine, Ser: 0.63 mg/dL (ref 0.61–1.24)
GFR calc Af Amer: >60 mL/min (ref >60)
GFR calc non Af Amer: >60 mL/min (ref >60)
Glucose, Bld: 94 mg/dL (ref 70–99)
Potassium: 3.4 mmol/L — ABNORMAL LOW (ref 3.5–5.1)
Sodium: 139 mmol/L (ref 135–145)
Total Bilirubin: 2.2 mg/dL — ABNORMAL HIGH (ref 0.3–1.2)
Total Protein: 8 g/dL (ref 6.5–8.1)

## 2018-04-01 LAB — RETICULOCYTES
Immature Retic Fract: 38.5 % — ABNORMAL HIGH (ref 2.3–15.9)
RBC.: 3.37 MIL/uL — ABNORMAL LOW (ref 4.22–5.81)
Retic Count, Absolute: 378.8 10*3/uL — ABNORMAL HIGH (ref 19.0–186.0)
Retic Ct Pct: 11.2 % — ABNORMAL HIGH (ref 0.4–3.1)

## 2018-04-01 MED ORDER — IBUPROFEN 200 MG PO TABS
400.0000 mg | ORAL_TABLET | Freq: Once | ORAL | Status: AC
  Administered 2018-04-01: 400 mg via ORAL
  Filled 2018-04-01: qty 2

## 2018-04-01 MED ORDER — HYDROMORPHONE HCL 2 MG/ML IJ SOLN: 2.0000 mg | INTRAMUSCULAR | Status: DC

## 2018-04-01 MED ORDER — HYDROMORPHONE HCL 2 MG/ML IJ SOLN
2.0000 mg | INTRAMUSCULAR | Status: AC
  Administered 2018-04-01: 2 mg via INTRAVENOUS

## 2018-04-01 MED ORDER — HYDROMORPHONE HCL 2 MG/ML IJ SOLN: 2.0000 mg | INTRAMUSCULAR | Status: AC

## 2018-04-01 MED ORDER — HYDROMORPHONE HCL 2 MG/ML IJ SOLN
2.0000 mg | INTRAMUSCULAR | Status: DC
  Administered 2018-04-01: 2 mg via INTRAVENOUS
  Filled 2018-04-01: qty 1

## 2018-04-01 MED ORDER — HYDROMORPHONE HCL 2 MG/ML IJ SOLN
2.0000 mg | INTRAMUSCULAR | Status: AC
  Filled 2018-04-01: qty 1

## 2018-04-01 MED ORDER — HYDROMORPHONE HCL 2 MG/ML IJ SOLN
2.0000 mg | INTRAMUSCULAR | Status: AC | PRN
  Administered 2018-04-01 (×3): 2 mg via SUBCUTANEOUS
  Filled 2018-04-01 (×3): qty 1

## 2018-04-01 MED ORDER — HYDROMORPHONE HCL 2 MG/ML IJ SOLN
2.0000 mg | INTRAMUSCULAR | Status: AC
  Administered 2018-04-01: 2 mg via INTRAVENOUS
  Filled 2018-04-01: qty 1

## 2018-04-01 MED ORDER — ONDANSETRON HCL 4 MG/2ML IJ SOLN
4.0000 mg | Freq: Once | INTRAMUSCULAR | Status: AC
  Administered 2018-04-01: 4 mg via INTRAVENOUS
  Filled 2018-04-01: qty 2

## 2018-04-01 NOTE — Discharge Instructions (Addendum)
You were evaluated in the Emergency Department and after careful evaluation, we did not find any emergent condition requiring admission or further testing in the hospital. ° °Please return to the Emergency Department if you experience any worsening of your condition.  We encourage you to follow up with a primary care provider.  Thank you for allowing us to be a part of your care. °

## 2018-04-01 NOTE — ED Notes (Signed)
Patient has been provided PO fluids and gram crackers and peanut butter.

## 2018-04-01 NOTE — ED Triage Notes (Signed)
Pt c/o sickle cell pain, back pain since last night.

## 2018-04-01 NOTE — ED Notes (Signed)
Patient ambulated to restroom without assistance or complication.

## 2018-04-01 NOTE — ED Provider Notes (Signed)
[facility] Emergency Department Provider Note MRN:  [id number]  Arrival date & time: 04/01/18     Chief Complaint   Sickle Cell Pain Crisis   History of Present Illness   Timothy Marks is a 28 y.o. year-old male with a history of sickle cell disease presenting to the ED with chief complaint of sickle cell pain crisis.  Patient complaining of pain "all over".  Worst in the lower back.  The location and severity of pain is typical of his prior pain crises.  Denies fever, no chest pain or shortness of breath, no bowel or bladder dysfunction, no numbness or weakness to the arms or legs.  Pain is 9 out of 10, constant, no exacerbating relieving factors.  Took home Percocet this morning without effect.  Review of Systems  A complete 10 system review of systems was obtained and all systems are negative except as noted in the HPI and PMH.   Patient's Health History    Past Medical History:  Diagnosis Date  . Depression   . 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 Sickle cell anemia (HCC)   . Vitamin D deficiency y-6    Past Surgical History:  Procedure Laterality Date  . CHOLECYSTECTOMY      Family History  Problem Relation Age of Onset  . Sickle cell trait Mother   . Sickle cell trait Father     Social History   Socioeconomic History  . Marital status: Single    Spouse name: Not on file  . Number of children: Not on file  . Years of education: Not on file  . Highest education level: Not on file  Occupational History  . Not on file  Social Needs  . Financial resource strain: Not on file  . Food insecurity:    Worry: Not on file    Inability: Not on file  . Transportation needs:    Medical: Not on file    Non-medical: Not on file  Tobacco Use  . Smoking status: Never Smoker  . Smokeless tobacco: Never Used  Substance and Sexual Activity  . Alcohol use: No  . Drug use: No    Types:  Marijuana    Comment: denies use 05/01/2017  . Sexual activity: Not on file  Lifestyle  . Physical activity:    Days per week: Not on file    Minutes per session: Not on file  . Stress: Not on file  Relationships  . Social connections:    Talks on phone: Not on file    Gets together: Not on file    Attends religious service: Not on file    Active member of club or organization: Not on file    Attends meetings of clubs or organizations: Not on file    Relationship status: Not on file  . Intimate partner violence:    Fear of current or ex partner: Not on file    Emotionally abused: Not on file    Physically abused: Not on file    Forced sexual activity: Not on file  Other Topics Concern  . Not on file  Social History Narrative  . Not on file     Physical Exam  Vital Signs and Nursing Notes reviewed Vitals:   04/01/18 1030 04/01/18 1100  BP: 124/67 118/62  Pulse: 67 69  Resp: 17 14  Temp:    SpO2: 99% 99%    CONSTITUTIONAL: Well-appearing,  NAD NEURO:  Alert and oriented x 3, no focal deficits EYES:  eyes equal and reactive ENT/NECK:  no LAD, no JVD CARDIO: Regular rate, well-perfused, normal S1 and S2 PULM:  CTAB no wheezing or rhonchi GI/GU:  normal bowel sounds, non-distended, non-tender MSK/SPINE:  No gross deformities, no edema SKIN:  no rash, atraumatic PSYCH:  Appropriate speech and behavior  Diagnostic and Interventional Summary    Labs Reviewed  COMPREHENSIVE METABOLIC PANEL - Abnormal; Notable for the following components:      Result Value   Potassium 3.4 (*)    BUN <5 (*)    Total Bilirubin 2.2 (*)    All other components within normal limits  CBC WITH DIFFERENTIAL/PLATELET - Abnormal; Notable for the following components:   RBC 3.37 (*)    Hemoglobin 10.2 (*)    HCT 30.2 (*)    RDW 21.2 (*)    Platelets 503 (*)    nRBC 3.9 (*)    Abs Immature Granulocytes 0.16 (*)    All other components within normal limits  RETICULOCYTES - Abnormal; Notable  for the following components:   Retic Ct Pct 11.2 (*)    RBC. 3.37 (*)    Retic Count, Absolute 378.8 (*)    Immature Retic Fract 38.5 (*)    All other components within normal limits    No orders to display    Medications  HYDROmorphone (DILAUDID) injection 2 mg (2 mg Intravenous Given 04/01/18 1040)    Or  HYDROmorphone (DILAUDID) injection 2 mg ( Subcutaneous See Alternative 04/01/18 1040)  HYDROmorphone (DILAUDID) injection 2 mg (2 mg Intravenous Given 04/01/18 0820)    Or  HYDROmorphone (DILAUDID) injection 2 mg ( Subcutaneous See Alternative 04/01/18 0820)  HYDROmorphone (DILAUDID) injection 2 mg (2 mg Intravenous Given 04/01/18 0859)    Or  HYDROmorphone (DILAUDID) injection 2 mg ( Subcutaneous See Alternative 04/01/18 0859)  HYDROmorphone (DILAUDID) injection 2 mg (2 mg Intravenous Given 04/01/18 0935)    Or  HYDROmorphone (DILAUDID) injection 2 mg ( Subcutaneous See Alternative 04/01/18 0935)  ondansetron (ZOFRAN) injection 4 mg (4 mg Intravenous Given 04/01/18 6546)     Procedures Critical Care  ED Course and Medical Decision Making  I have reviewed the triage vital signs and the nursing notes.  Pertinent labs & imaging results that were available during my care of the patient were reviewed by me and considered in my medical decision making (see below for details).  Sickle cell pain crisis without any signs of complication, no fever, well-appearing, seems to be in pain, states that he has had more severe crises in the past, might be able to go home after a few rounds of medication.  Feeling better after third dose Dilaudid, states he feels good enough to go home and manage this at home.  Has good follow-up with his regular doctors.  After the discussed management above, the patient was determined to be safe for discharge.  The patient was in agreement with this plan and all questions regarding their care were answered.  ED return precautions were discussed and the patient will  return to the ED with any significant worsening of condition.  Barth Kirks. Sedonia Small, [place] [email or web address]  Final Clinical Impressions(s) / ED Diagnoses     ICD-10-CM   1. Sickle cell pain crisis [facility]) D57.00     ED Discharge Orders    None         Bero, Barth Kirks, MD  04/01/18 1118  

## 2018-04-01 NOTE — ED Provider Notes (Signed)
[facility] DEPT Provider Note   CSN: [id number] Arrival date & time: 04/01/18  1753     History   Chief Complaint Chief Complaint  Patient presents with  . Sickle Cell Pain Crisis    HPI Timothy Marks is a 28 y.o. male.  HPI   [name] with "pain all over." When asked to be more specific he replied "everywhere." Hx of sickle cell anemia. Says he feels like he is having a pain crisis. Was seen in the ED earlier for the same. Was treated and reported improvement. Says he began feeling poorly again shortly after leaving. Tried his home medications w/o improvement. No other acute complaints. Denies dyspnea, cough, fever.   Past Medical History:  Diagnosis Date  . Depression   . 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 Sickle cell anemia (HCC)   . Vitamin D deficiency y-6    Patient Active Problem List   Diagnosis Date Noted  . Acute bilateral low back pain without sciatica   . Other chronic pain   . Anemia of chronic disease   . 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 Physical deconditioning   . Agitation   . Chronic pain syndrome   . Recurrent cold sores   . Fever   . Pain   . Sickle cell crisis ([place]) 07/16/2017  . Adjustment disorder with mixed disturbance of emotions and conduct   . Constipation 08/03/2014  . 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 Protein-calorie malnutrition, severe ([place]) 04/09/2014  . Major depressive disorder, recurrent, severe without psychotic features ([place])   . 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 Vitamin D deficiency   . Sickle cell anemia ([place]) 03/30/2014  . Hyperbilirubinemia 03/30/2014  . Hypokalemia 02/07/2014  . Aggressive behavior 01/25/2013  . Sickle cell pain crisis ([place]) 01/24/2013    Past Surgical History:  Procedure Laterality Date  . CHOLECYSTECTOMY          Home Medications    Prior to Admission medications     Medication Sig Start Date End Date Taking? Authorizing Provider  aspirin EC 81 MG tablet Take 1 tablet (81 mg total) by mouth daily. Patient not taking: Reported on 04/01/2018 06/02/17   Dorena Dew, FNP  cholecalciferol (VITAMIN D) 1000 units tablet Take 2 tablets (2,000 Units total) by mouth daily. 12/13/17   Azzie Glatter, FNP  ELIQUIS STARTER PACK (ELIQUIS STARTER PACK) 5 MG TABS Take as directed on package: start with two-5mg  tablets twice daily for 7 days. On day 8, switch to one-5mg  tablet twice daily. Patient not taking: Reported on 03/18/2018 12/13/17   Azzie Glatter, FNP  folic acid (FOLVITE) 1 MG tablet Take 1 tablet (1 mg total) by mouth daily. 05/01/17   Dorena Dew, FNP  gabapentin (NEURONTIN) 300 MG capsule Take 1 capsule (300 mg total) by mouth 3 (three) times daily. For 30 days. Patient taking differently: Take 300 mg by mouth daily.  01/16/18 04/01/18  Lanae Boast, FNP  hydroxyurea (HYDREA) 500 MG capsule Take 1 capsule (500 mg total) by mouth 2 (two) times daily. May take with food to minimize GI side effects. 05/01/17   Dorena Dew, FNP  ibuprofen (ADVIL,MOTRIN) 800 MG tablet Take 1 tablet (800 mg total) by mouth 3 (three) times daily as needed (pain). 12/20/17   Azzie Glatter, FNP  oxyCODONE (OXYCONTIN) 15 mg 12 hr tablet Take 1 tablet (15 mg total)  by mouth every 8 (eight) hours for 30 days. 03/04/18 04/03/18  Lanae Boast, FNP  oxyCODONE-acetaminophen (PERCOCET) 10-325 MG tablet Take 1 tablet by mouth every 6 (six) hours as needed for up to 18 days for pain. 03/15/18 04/02/18  Lanae Boast, FNP    Family History Family History  Problem Relation Age of Onset  . Sickle cell trait Mother   . Sickle cell trait Father     Social History Social History   Tobacco Use  . Smoking status: Never Smoker  . Smokeless tobacco: Never Used  Substance Use Topics  . Alcohol use: No  . Drug use: No    Types: Marijuana    Comment: denies use 05/01/2017      Allergies   Buprenorphine hcl; Levaquin [levofloxacin]; Meperidine; Morphine and related; Toradol [ketorolac tromethamine]; Tramadol; Vancomycin; and Zosyn [piperacillin sod-tazobactam so]   Review of Systems Review of Systems  All systems reviewed and negative, other than as noted in HPI.    Physical Exam Updated Vital Signs BP 129/75 (BP Location: Left Arm)   Pulse 67   Temp 98.7 F (37.1 C) (Oral)   Resp 18   SpO2 98%   Physical Exam Vitals signs and nursing note reviewed.  Constitutional:      General: He is not in acute distress.    Appearance: He is well-developed.  HENT:     Head: Normocephalic and atraumatic.  Eyes:     General:        Right eye: No discharge.        Left eye: No discharge.     Conjunctiva/sclera: Conjunctivae normal.  Neck:     Musculoskeletal: Neck supple.  Cardiovascular:     Rate and Rhythm: Normal rate and regular rhythm.     Heart sounds: Normal heart sounds. No murmur. No friction rub. No gallop.   Pulmonary:     Effort: Pulmonary effort is normal. No respiratory distress.     Breath sounds: Normal breath sounds.  Abdominal:     General: There is no distension.     Palpations: Abdomen is soft.     Tenderness: There is no abdominal tenderness.  Musculoskeletal:        General: No tenderness.  Skin:    General: Skin is warm and dry.  Neurological:     Mental Status: He is alert.  Psychiatric:        Behavior: Behavior normal.        Thought Content: Thought content normal.      ED Treatments / Results  Labs (all labs ordered are listed, but only abnormal results are displayed) Labs Reviewed - No data to display  EKG None  Radiology No results found.  Procedures Procedures (including critical care time)  Medications Ordered in ED Medications  HYDROmorphone (DILAUDID) injection 2 mg (has no administration in time range)  ibuprofen (ADVIL,MOTRIN) tablet 400 mg (has no administration in time range)      Initial Impression / Assessment and Plan / ED Course  I have reviewed the triage vital signs and the nursing notes.  Pertinent labs & imaging results that were available during my care of the patient were reviewed by me and considered in my medical decision making (see chart for details).     [name] with likely chronic pain. Frequent utilizer of ED. Labs from earlier reviewed. I doubt any utility in repeating. No new complaints. Pain treated with improvement and he says he would like to go home. Outpt FU.  Final Clinical Impressions(s) / ED Diagnoses   Final diagnoses:  Sickle cell pain crisis [facility])    ED Discharge Orders    None       Virgel Manifold, MD 04/02/18 [phone]

## 2018-04-01 NOTE — ED Triage Notes (Signed)
Pt states although he was seen today for SCC pain, his pain has only but worsened. Rates pain 10/10.

## 2018-04-03 ENCOUNTER — Telehealth: Payer: Self-pay

## 2018-04-03 DIAGNOSIS — D571 Sickle-cell disease without crisis: Secondary | ICD-10-CM

## 2018-04-03 MED ORDER — GABAPENTIN 300 MG PO CAPS: 300.0000 mg | ORAL_CAPSULE | Freq: Three times a day (TID) | ORAL | 0 refills | Status: DC

## 2018-04-03 NOTE — Telephone Encounter (Signed)
Patient was instructed to come in for a pill count due to not having a opiates on his UDS. Patient did not comply. This provider will discuss this situation with the Medical Director and decide the next steps.  Will send the gabapentin to the pharmacy on file.  Will call patient when this provider is back in the office on 04/04/2018.

## 2018-04-03 NOTE — Telephone Encounter (Signed)
Patient called again regarding OxyContin and ws advise that provider has message and that you are not in office today.

## 2018-04-04 ENCOUNTER — Telehealth: Payer: Self-pay | Admitting: Family Medicine

## 2018-04-04 DIAGNOSIS — D571 Sickle-cell disease without crisis: Secondary | ICD-10-CM

## 2018-04-04 MED ORDER — OXYCODONE HCL ER 15 MG PO T12A: 15.0000 mg | EXTENDED_RELEASE_TABLET | Freq: Three times a day (TID) | ORAL | 0 refills | Status: DC

## 2018-04-04 MED ORDER — OXYCODONE-ACETAMINOPHEN 10-325 MG PO TABS: 1.0000 | ORAL_TABLET | Freq: Four times a day (QID) | ORAL | 0 refills | Status: DC | PRN

## 2018-04-04 NOTE — Telephone Encounter (Signed)
Spoke with patient this AM. Patient states that he is currently in the hospital due to crisis. He states that he needs refills on his medications. Informed patient that I will send in one weeks worth of medication and have patient come in for another UDS. Patient states that he missed the last pill count due to being out of town. I informed the patient that this was not mentioned when he was called to come in. He is not compliant with the drug contract. Patient states that he is going to seek care elsewhere. Patient states that he would like to have a non- gel coated medication because he is unable to crush them. He states that this is a recent change.  Advised patient to contact the pharmacy about that issue because it seems to be a manufacturing problem.  Patient states that he is going to find a new doctor to help with his sickle cell.

## 2018-04-06 ENCOUNTER — Telehealth: Payer: Self-pay

## 2018-04-06 NOTE — Telephone Encounter (Signed)
Called and spoke with patient, he was asking if his medication can be changed to a tablet. I advised that this can not be done. Also he asked when he was going to receive more than a weeks worth of medication. He will have to come in next week for a UDS before any medication will be given provider. Thanks!

## 2018-04-10 ENCOUNTER — Other Ambulatory Visit: Payer: Medicaid Other

## 2018-04-10 DIAGNOSIS — D571 Sickle-cell disease without crisis: Secondary | ICD-10-CM

## 2018-04-11 ENCOUNTER — Telehealth: Payer: Self-pay

## 2018-04-11 NOTE — Telephone Encounter (Signed)
Called, no answer. Left a message that test results were not back and we will call when available. Thanks!

## 2018-04-13 ENCOUNTER — Telehealth: Payer: Self-pay

## 2018-04-13 NOTE — Telephone Encounter (Signed)
Patient is asking about last drug screen results and if/when medication will be sent in. Please advise. Thanks!

## 2018-04-14 LAB — DRUG SCREEN 764883 11+OXYCO+ALC+CRT-BUND
Amphetamines, Urine: NEGATIVE ng/mL
BENZODIAZ UR QL: NEGATIVE ng/mL
Barbiturate: NEGATIVE ng/mL
Cocaine (Metabolite): NEGATIVE ng/mL
Creatinine: 206 mg/dL (ref 20.0–300.0)
Ethanol: NEGATIVE %
Meperidine: NEGATIVE ng/mL
Methadone Screen, Urine: NEGATIVE ng/mL
Phencyclidine: NEGATIVE ng/mL
Propoxyphene: NEGATIVE ng/mL
Tramadol: NEGATIVE ng/mL
pH, Urine: 6 (ref 4.5–8.9)

## 2018-04-14 LAB — CANNABINOID CONFIRMATION, UR
CANNABINOIDS: POSITIVE — AB
Carboxy THC GC/MS Conf: >400 ng/mL

## 2018-04-14 LAB — OXYCODONE/OXYMORPHONE, CONFIRM
OXYCODONE/OXYMORPH: POSITIVE — AB
OXYCODONE: >3000 ng/mL
OXYCODONE: POSITIVE — AB
OXYMORPHONE (GC/MS): >3000 ng/mL
OXYMORPHONE: POSITIVE — AB

## 2018-04-14 LAB — OPIATES CONFIRMATION, URINE: Opiates: NEGATIVE ng/mL

## 2018-04-16 ENCOUNTER — Telehealth: Payer: Self-pay

## 2018-04-16 NOTE — Telephone Encounter (Signed)
It was requested for me to speak with patient regarding his refill request due to his noncompliance with the previous request for him to come in for conversation and signing a new contract with [facility]ples.  I spoke to the patient and informed him he would need to come in and speak with Venora Maples and sign a new contract and discuss the terms and he was in agreement.  Next available appointment was 3/4 at [facility] and patient accepted this appointment.  Appointment scheduled in [facility] for this time by myself.

## 2018-04-16 NOTE — Telephone Encounter (Signed)
His last drug screen was positive for opiates and THC. Per my previous conversation with the patient that has been documented in Epic, patient did not want to have weekly medication refills and pill counts. He stated that he was going to transfer his care to another provider. If he would like to continue his care under this provider, he will need to re-sign a controlled substance agreement, agree to pill counts, weekly prescriptions, and random drug testing that show that he has opiates in his system.

## 2018-04-18 ENCOUNTER — Encounter: Payer: Self-pay | Admitting: Family Medicine

## 2018-04-18 ENCOUNTER — Ambulatory Visit (INDEPENDENT_AMBULATORY_CARE_PROVIDER_SITE_OTHER): Payer: Medicaid Other | Admitting: Family Medicine

## 2018-04-18 VITALS — Ht 74.0 in | Wt 124.0 lb

## 2018-04-18 DIAGNOSIS — D571 Sickle-cell disease without crisis: Secondary | ICD-10-CM | POA: Diagnosis not present

## 2018-04-18 DIAGNOSIS — Z09 Encounter for follow-up examination after completed treatment for conditions other than malignant neoplasm: Secondary | ICD-10-CM

## 2018-04-18 DIAGNOSIS — G894 Chronic pain syndrome: Secondary | ICD-10-CM

## 2018-04-18 LAB — POCT URINALYSIS DIP (MANUAL ENTRY)
Bilirubin, UA: NEGATIVE
Glucose, UA: NEGATIVE mg/dL
Ketones, POC UA: NEGATIVE mg/dL
Leukocytes, UA: NEGATIVE
Nitrite, UA: NEGATIVE
Protein Ur, POC: NEGATIVE mg/dL
Spec Grav, UA: 1.01 (ref 1.010–1.025)
Urobilinogen, UA: 0.2 E.U./dL
pH, UA: 7 (ref 5.0–8.0)

## 2018-04-18 MED ORDER — OXYCODONE HCL ER 15 MG PO T12A: 15.0000 mg | EXTENDED_RELEASE_TABLET | Freq: Three times a day (TID) | ORAL | 0 refills | Status: DC

## 2018-04-18 MED ORDER — OXYCODONE-ACETAMINOPHEN 10-325 MG PO TABS: 1.0000 | ORAL_TABLET | Freq: Four times a day (QID) | ORAL | 0 refills | Status: DC | PRN

## 2018-04-18 NOTE — Patient Instructions (Signed)
Sickle Cell Anemia, Adult °Sickle cell anemia is a condition where your red blood cells are shaped like sickles. Red blood cells carry oxygen through the body. Sickle-shaped cells do not live as long as normal red blood cells. They also clump together and block blood from flowing through the blood vessels. This prevents the body from getting enough oxygen. Sickle cell anemia causes organ damage and pain. It also increases the risk of infection. °Follow these instructions at home: °Medicines °· Take over-the-counter and prescription medicines only as told by your doctor. °· If you were prescribed an antibiotic medicine, take it as told by your doctor. Do not stop taking the antibiotic even if you start to feel better. °· If you develop a fever, do not take medicines to lower the fever right away. Tell your doctor about the fever. °Managing pain, stiffness, and swelling °· Try these methods to help with pain: °? Use a heating pad. °? Take a warm bath. °? Distract yourself, such as by watching TV. °Eating and drinking °· Drink enough fluid to keep your pee (urine) clear or pale yellow. Drink more in hot weather and during exercise. °· Limit or avoid alcohol. °· Eat a healthy diet. Eat plenty of fruits, vegetables, whole grains, and lean protein. °· Take vitamins and supplements as told by your doctor. °Traveling °· When traveling, keep these with you: °? Your medical information. °? The names of your doctors. °? Your medicines. °· If you need to take an airplane, talk to your doctor first. °Activity °· Rest often. °· Avoid exercises that make your heart beat much faster, such as jogging. °General instructions °· Do not use products that have nicotine or tobacco, such as cigarettes and e-cigarettes. If you need help quitting, ask your doctor. °· Consider wearing a medical alert bracelet. °· Avoid being in high places (high altitudes), such as mountains. °· Avoid very hot or cold temperatures. °· Avoid places where the  temperature changes a lot. °· Keep all follow-up visits as told by your doctor. This is important. °Contact a doctor if: °· A joint hurts. °· Your feet or hands hurt or swell. °· You feel tired (fatigued). °Get help right away if: °· You have symptoms of infection. These include: °? Fever. °? Chills. °? Being very tired. °? Irritability. °? Poor eating. °? Throwing up (vomiting). °· You feel dizzy or faint. °· You have new stomach pain, especially on the left side. °· You have a an erection (priapism) that lasts more than 4 hours. °· You have numbness in your arms or legs. °· You have a hard time moving your arms or legs. °· You have trouble talking. °· You have pain that does not go away when you take medicine. °· You are short of breath. °· You are breathing fast. °· You have a long-term cough. °· You have pain in your chest. °· You have a bad headache. °· You have a stiff neck. °· Your stomach looks bloated even though you did not eat much. °· Your skin is pale. °· You suddenly cannot see well. °Summary °· Sickle cell anemia is a condition where your red blood cells are shaped like sickles. °· Follow your doctor's advice on ways to manage pain, food to eat, activities to do, and steps to take for safe travel. °· Get medical help right away if you have any signs of infection, such as a fever. °This information is not intended to replace advice given to you by your   health care provider. Make sure you discuss any questions you have with your health care provider. °Document Released: 11/21/2012 Document Revised: 03/08/2016 Document Reviewed: 03/08/2016 °Elsevier Interactive Patient Education © 2019 Elsevier Inc. ° °

## 2018-04-18 NOTE — Progress Notes (Signed)
[facility] INTERNAL MEDICINE AND SICKLE CELL CARE  SICKLE CELL ANEMIA FOLLOW UP VISIT PROVIDER: Lanae Boast, FNP    Subjective:   Timothy Marks  is a 28 y.o.  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 presents for a follow up for Sickle Cell Anemia.  The patient has had 8 admissions in the past 6 months.  Pain regimen includes: Ibuprofen, gabapentin, oxycontin 15mg  Percocet 10-325 Hydrea Therapy: Yes Medication compliance: Patient reports compliance with medication. Has a history of not having opioids in his system.    The patient reports adequate daily hydration.      Review of Systems  Constitutional: Negative.   HENT: Negative.   Eyes: Negative.   Respiratory: Negative.   Cardiovascular: Negative.   Gastrointestinal: Negative.   Genitourinary: Negative.   Musculoskeletal: Positive for myalgias.  Skin: Negative.   Neurological: Negative.   Psychiatric/Behavioral: Negative.     Objective:   Objective  Ht 6\' 2"  (1.88 m)   Wt 124 lb (56.2 kg)   BMI 15.92 kg/m   Wt Readings from Last 3 Encounters:  04/18/18 124 lb (56.2 kg)  04/01/18 (S) 127 lb 6.8 oz (57.8 kg)  03/26/18 127 lb (57.6 kg)     Physical Exam Vitals signs and nursing note reviewed.  Constitutional:      General: He is not in acute distress.    Appearance: He is well-developed.  HENT:     Head: Normocephalic and atraumatic.  Eyes:     Conjunctiva/sclera: Conjunctivae normal.     Pupils: Pupils are equal, round, and reactive to light.  Neck:     Musculoskeletal: Normal range of motion.  Cardiovascular:     Rate and Rhythm: Normal rate and regular rhythm.     Heart sounds: Murmur present.  Pulmonary:     Effort: Pulmonary effort is normal. No respiratory distress.     Breath sounds: Normal breath sounds.  Abdominal:     General: Bowel  sounds are normal. There is no distension.     Palpations: Abdomen is soft.  Musculoskeletal: Normal range of motion.  Skin:    General: Skin is warm and dry.  Neurological:     Mental Status: He is alert and oriented to person, place, and time.  Psychiatric:        Mood and Affect: Mood normal.        Behavior: Behavior normal.        Thought Content: Thought content normal.        Judgment: Judgment normal.      Assessment/Plan:   Assessment   Encounter Diagnosis  Name Primary?  . Follow up Yes     Plan  1. Hb-SS disease without crisis [facility]) Patient signed controlled substance agreement. Copy given. Instructed to return in 2 weeks for UDS and pill count. Will give weekly scripts for pain medications.  - oxyCODONE-acetaminophen (PERCOCET) 10-325 MG tablet; Take 1 tablet by mouth every 6 (six) hours as needed for up to 9 days for pain.  Dispense: 36 tablet; Refill: 0  2. Follow up - POCT urinalysis dipstick  3. Chronic pain syndrome    Return to care as scheduled and prn. Patient verbalized understanding and agreed with plan of care.   1. Sickle cell disease - Continue Hydrea  We discussed the need for good hydration, monitoring of hydration status, avoidance of heat,  cold, stress, and infection triggers. We discussed the risks and benefits of Hydrea, including bone marrow suppression, the possibility of GI upset, skin ulcers, hair thinning, and teratogenicity. The patient was reminded of the need to seek medical attention of any symptoms of bleeding, anemia, or infection. Continue folic acid 1 mg daily to prevent aplastic bone marrow crises.   2. Pulmonary evaluation - Patient denies severe recurrent wheezes, shortness of breath with exercise, or persistent cough. If these symptoms develop, pulmonary function tests with spirometry will be ordered, and if abnormal, plan on referral to Pulmonology for further evaluation.  3. Cardiac - Routine screening for pulmonary  hypertension is not recommended.  4. Eye - High risk of proliferative retinopathy. Annual eye exam with retinal exam recommended to patient.  5. Immunization status -  Yearly influenza vaccination is recommended, as well as being up to date with Meningococcal and Pneumococcal vaccines.   6. Acute and chronic painful episodes - We discussed that pt is to receive Schedule II prescriptions only from [place]. Pt is also aware that the prescription history is available to [place] online through the [facility] CSRS. Controlled substance agreement signed. We reminded Ervey Fallin that all patients receiving Schedule II narcotics must be seen for follow within one month of prescription being requested. We reviewed the terms of our pain agreement, including the need to keep medicines in a safe locked location away from children or pets, and the need to report excess sedation or constipation, measures to avoid constipation, and policies related to early refills and stolen prescriptions. According to the [place] Chronic Pain Initiative program, we have reviewed details related to analgesia, adverse effects, aberrant behaviors.  7. Iron overload from chronic transfusion.  Not applicable at this time.  If this occurs will use Exjade for management.   8. Vitamin D deficiency - Drisdol 50,000 units weekly. Patient encouraged to take as prescribed.   The above recommendations are taken from the NIH Evidence-Based Management of Sickle Cell Disease: Expert Panel Report, 20149.   [name], FNP-BC [facility] [address] [place], [place] [address] [phone]  This note has been created with Dragon speech recognition software and smart phrase technology. Any transcriptional errors are unintentional.

## 2018-04-23 ENCOUNTER — Ambulatory Visit: Payer: Self-pay | Admitting: Family Medicine

## 2018-04-24 ENCOUNTER — Telehealth: Payer: Self-pay

## 2018-04-24 DIAGNOSIS — D571 Sickle-cell disease without crisis: Secondary | ICD-10-CM

## 2018-04-26 MED ORDER — OXYCODONE-ACETAMINOPHEN 10-325 MG PO TABS: 1.0000 | ORAL_TABLET | Freq: Four times a day (QID) | ORAL | 0 refills | Status: DC | PRN

## 2018-04-26 MED ORDER — OXYCODONE HCL ER 15 MG PO T12A: 15.0000 mg | EXTENDED_RELEASE_TABLET | Freq: Three times a day (TID) | ORAL | 0 refills | Status: DC

## 2018-04-26 NOTE — Telephone Encounter (Signed)
refilled 

## 2018-04-27 ENCOUNTER — Telehealth: Payer: Self-pay

## 2018-04-27 NOTE — Telephone Encounter (Signed)
This medication was sent yesterday.

## 2018-04-27 NOTE — Telephone Encounter (Signed)
Refill request from [facility] for oxycontin 15mg  every 8 hours.

## 2018-05-03 ENCOUNTER — Ambulatory Visit (INDEPENDENT_AMBULATORY_CARE_PROVIDER_SITE_OTHER): Payer: Medicaid Other | Admitting: Family Medicine

## 2018-05-03 ENCOUNTER — Other Ambulatory Visit: Payer: Self-pay

## 2018-05-03 ENCOUNTER — Encounter: Payer: Self-pay | Admitting: Family Medicine

## 2018-05-03 VITALS — BP 116/64 | HR 76 | Temp 98.0°F | Ht 74.0 in | Wt 128.0 lb

## 2018-05-03 DIAGNOSIS — Z09 Encounter for follow-up examination after completed treatment for conditions other than malignant neoplasm: Secondary | ICD-10-CM | POA: Diagnosis not present

## 2018-05-03 DIAGNOSIS — D571 Sickle-cell disease without crisis: Secondary | ICD-10-CM | POA: Diagnosis not present

## 2018-05-03 LAB — POCT URINALYSIS DIP (MANUAL ENTRY)
Glucose, UA: NEGATIVE mg/dL
Ketones, POC UA: NEGATIVE mg/dL
Leukocytes, UA: NEGATIVE
Nitrite, UA: NEGATIVE
Protein Ur, POC: 30 mg/dL — AB
Spec Grav, UA: 1.02 (ref 1.010–1.025)
Urobilinogen, UA: 2 E.U./dL — AB
pH, UA: 6 (ref 5.0–8.0)

## 2018-05-03 MED ORDER — OXYCODONE HCL ER 20 MG PO T12A: 20.0000 mg | EXTENDED_RELEASE_TABLET | Freq: Two times a day (BID) | ORAL | 0 refills | Status: DC

## 2018-05-03 MED ORDER — OXYCODONE-ACETAMINOPHEN 10-325 MG PO TABS: 1.0000 | ORAL_TABLET | Freq: Four times a day (QID) | ORAL | 0 refills | Status: DC | PRN

## 2018-05-03 NOTE — Progress Notes (Signed)
PATIENT CARE CENTER INTERNAL MEDICINE AND SICKLE CELL CARE  SICKLE CELL ANEMIA FOLLOW UP VISIT PROVIDER: Lanae Boast, FNP    Subjective:   Timothy Marks  is a 28 y.o.  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 presents for a follow up for Sickle Cell Anemia.  The patient has had 8 admissions in the past 6 months.  Pain regimen includes: Ibuprofen, gabapentin, oxycontin 15mg  Percocet 10-325 Hydrea Therapy: Yes Medication compliance: Patient reports compliance with medication. Has a history of not having opioids in his system. He did not bring his medications with him today.    The patient reports adequate daily hydration.      Review of Systems  Constitutional: Negative.   HENT: Negative.   Eyes: Negative.   Respiratory: Negative.   Cardiovascular: Negative.   Gastrointestinal: Negative.   Genitourinary: Negative.   Musculoskeletal: Positive for myalgias.  Skin: Negative.   Neurological: Negative.   Psychiatric/Behavioral: Negative.     Objective:   Objective  BP 116/64 (BP Location: Left Arm, Patient Position: Sitting, Cuff Size: Small)    Pulse 76    Temp 98 F (36.7 C) (Oral)    Ht 6\' 2"  (1.88 m)    Wt 128 lb (58.1 kg)    SpO2 100%    BMI 16.43 kg/m   Wt Readings from Last 3 Encounters:  05/03/18 128 lb (58.1 kg)  04/18/18 124 lb (56.2 kg)  04/01/18 (S) 127 lb 6.8 oz (57.8 kg)     Physical Exam Vitals signs and nursing note reviewed.  Constitutional:      General: He is not in acute distress.    Appearance: He is well-developed.  HENT:     Head: Normocephalic and atraumatic.  Eyes:     Conjunctiva/sclera: Conjunctivae normal.     Pupils: Pupils are equal, round, and reactive to light.  Neck:     Musculoskeletal: Normal range of motion.  Cardiovascular:     Rate and Rhythm: Normal rate and regular rhythm.   Heart sounds: Murmur present.  Pulmonary:     Effort: Pulmonary effort is normal. No respiratory distress.     Breath sounds: Normal breath sounds.  Abdominal:     General: Bowel sounds are normal. There is no distension.     Palpations: Abdomen is soft.  Musculoskeletal: Normal range of motion.  Skin:    General: Skin is warm and dry.  Neurological:     Mental Status: He is alert and oriented to person, place, and time.  Psychiatric:        Mood and Affect: Mood normal.        Behavior: Behavior normal.        Thought Content: Thought content normal.        Judgment: Judgment normal.      Assessment/Plan:   Assessment   Encounter Diagnosis  Name Primary?   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 Copy given. Instructed to return in 2 weeks for UDS and pill count. Will give weekly scripts for pain medications.  - oxyCODONE-acetaminophen (PERCOCET) 10-325 MG tablet; Take 1 tablet by mouth every 6 (six) hours as needed for up to 9 days for pain.  Dispense: 36 tablet; Refill: 0  2. Follow up - POCT urinalysis dipstick  3. Chronic pain syndrome  Return to care as scheduled and prn. Patient verbalized understanding and agreed with plan of care.   1. Sickle cell disease - Continue Hydrea  We discussed the need for good hydration, monitoring of hydration status, avoidance of heat, cold, stress, and infection triggers. We discussed the risks and benefits of Hydrea, including bone marrow suppression, the possibility of GI upset, skin ulcers, hair thinning, and teratogenicity. The patient was reminded of the need to seek medical attention of any symptoms of bleeding, anemia, or infection. Continue folic acid 1 mg daily to prevent aplastic bone marrow crises.   2. Pulmonary evaluation - Patient denies severe recurrent wheezes, shortness of breath with exercise, or persistent cough. If these symptoms develop,  pulmonary function tests with spirometry will be ordered, and if abnormal, plan on referral to Pulmonology for further evaluation.  3. Cardiac - Routine screening for pulmonary hypertension is not recommended.  4. Eye - High risk of proliferative retinopathy. Annual eye exam with retinal exam recommended to patient.  5. Immunization status -  Yearly influenza vaccination is recommended, as well as being up to date with Meningococcal and Pneumococcal vaccines.   6. Acute and chronic painful episodes - We discussed that pt is to receive Schedule II prescriptions only from [place]. Pt is also aware that the prescription history is available to [place] online through the [facility] CSRS. Controlled substance agreement signed. We reminded Timothy Marks that all patients receiving Schedule II narcotics must be seen for follow within one month of prescription being requested. We reviewed the terms of our pain agreement, including the need to keep medicines in a safe locked location away from children or pets, and the need to report excess sedation or constipation, measures to avoid constipation, and policies related to early refills and stolen prescriptions. According to the [place] [facility] program, we have reviewed details related to analgesia, adverse effects, aberrant behaviors.  7. Iron overload from chronic transfusion.  Not applicable at this time.  If this occurs will use Exjade for management.   8. Vitamin D deficiency - Drisdol 50,000 units weekly. Patient encouraged to take as prescribed.   The above recommendations are taken from the NIH Evidence-Based Management of Sickle Cell Disease: Expert Panel Report, 20149.   Ms. Timothy L. Nathaneil Canary, FNP-BC [facility] [address] [place], [place] [address] [phone]  This note has been created with Dragon speech recognition software and smart phrase technology. Any transcriptional errors are unintentional.

## 2018-05-03 NOTE — Patient Instructions (Signed)
If you do not have your pills with you on your next visit, your medications will not be refilled.    Sickle Cell Anemia, Adult Sickle cell anemia is a condition where your red blood cells are shaped like sickles. Red blood cells carry oxygen through the body. Sickle-shaped cells do not live as long as normal red blood cells. They also clump together and block blood from flowing through the blood vessels. This prevents the body from getting enough oxygen. Sickle cell anemia causes organ damage and pain. It also increases the risk of infection. Follow these instructions at home: Medicines  Take over-the-counter and prescription medicines only as told by your doctor.  If you were prescribed an antibiotic medicine, take it as told by your doctor. Do not stop taking the antibiotic even if you start to feel better.  If you develop a fever, do not take medicines to lower the fever right away. Tell your doctor about the fever. Managing pain, stiffness, and swelling  Try these methods to help with pain: ? Use a heating pad. ? Take a warm bath. ? Distract yourself, such as by watching TV. Eating and drinking  Drink enough fluid to keep your pee (urine) clear or pale yellow. Drink more in hot weather and during exercise.  Limit or avoid alcohol.  Eat a healthy diet. Eat plenty of fruits, vegetables, whole grains, and lean protein.  Take vitamins and supplements as told by your doctor. Traveling  When traveling, keep these with you: ? Your medical information. ? The names of your doctors. ? Your medicines.  If you need to take an airplane, talk to your doctor first. Activity  Rest often.  Avoid exercises that make your heart beat much faster, such as jogging. General instructions  Do not use products that have nicotine or tobacco, such as cigarettes and e-cigarettes. If you need help quitting, ask your doctor.  Consider wearing a medical alert bracelet.  Avoid being in high places  (high altitudes), such as mountains.  Avoid very hot or cold temperatures.  Avoid places where the temperature changes a lot.  Keep all follow-up visits as told by your doctor. This is important. Contact a doctor if:  A joint hurts.  Your feet or hands hurt or swell.  You feel tired (fatigued). Get help right away if:  You have symptoms of infection. These include: ? Fever. ? Chills. ? Being very tired. ? Irritability. ? Poor eating. ? Throwing up (vomiting).  You feel dizzy or faint.  You have new stomach pain, especially on the left side.  You have a an erection (priapism) that lasts more than 4 hours.  You have numbness in your arms or legs.  You have a hard time moving your arms or legs.  You have trouble talking.  You have pain that does not go away when you take medicine.  You are short of breath.  You are breathing fast.  You have a long-term cough.  You have pain in your chest.  You have a bad headache.  You have a stiff neck.  Your stomach looks bloated even though you did not eat much.  Your skin is pale.  You suddenly cannot see well. Summary  Sickle cell anemia is a condition where your red blood cells are shaped like sickles.  Follow your doctor's advice on ways to manage pain, food to eat, activities to do, and steps to take for safe travel.  Get medical help right away if you  have any signs of infection, such as a fever. This information is not intended to replace advice given to you by your health care provider. Make sure you discuss any questions you have with your health care provider. Document Released: 11/21/2012 Document Revised: 03/08/2016 Document Reviewed: 03/08/2016 Elsevier Interactive Patient Education  2019 [employer].

## 2018-05-07 ENCOUNTER — Other Ambulatory Visit: Payer: Self-pay | Admitting: Family Medicine

## 2018-05-07 DIAGNOSIS — D571 Sickle-cell disease without crisis: Secondary | ICD-10-CM

## 2018-05-09 LAB — URINE DRUG PANEL 7
Amphetamines, Urine: NEGATIVE ng/mL
Barbiturate Quant, Ur: NEGATIVE ng/mL
Benzodiazepine Quant, Ur: NEGATIVE ng/mL
Cannabinoid Quant, Ur: POSITIVE — AB
Cocaine (Metab.): NEGATIVE ng/mL
Opiate Quant, Ur: NEGATIVE
PCP Quant, Ur: NEGATIVE ng/mL

## 2018-05-16 ENCOUNTER — Telehealth: Payer: Self-pay

## 2018-05-17 ENCOUNTER — Ambulatory Visit (INDEPENDENT_AMBULATORY_CARE_PROVIDER_SITE_OTHER): Payer: Medicaid Other | Admitting: Family Medicine

## 2018-05-17 ENCOUNTER — Other Ambulatory Visit: Payer: Self-pay

## 2018-05-17 ENCOUNTER — Encounter: Payer: Self-pay | Admitting: Family Medicine

## 2018-05-17 DIAGNOSIS — D571 Sickle-cell disease without crisis: Secondary | ICD-10-CM | POA: Diagnosis not present

## 2018-05-17 MED ORDER — OXYCODONE HCL ER 20 MG PO T12A: 20.0000 mg | EXTENDED_RELEASE_TABLET | Freq: Two times a day (BID) | ORAL | 0 refills | Status: DC

## 2018-05-17 MED ORDER — OXYCODONE-ACETAMINOPHEN 10-325 MG PO TABS: 1.0000 | ORAL_TABLET | Freq: Four times a day (QID) | ORAL | 0 refills | Status: DC | PRN

## 2018-05-17 NOTE — Progress Notes (Signed)
  Patient [facility] and Sickle Cell Care  Virtual Visit via Telephone Note  I connected with [name] on 05/17/18 at  8:40 AM EDT by telephone and verified that I am speaking with the correct person using two identifiers.   I discussed the limitations, risks, security and privacy concerns of performing an evaluation and management service by telephone and the availability of in person appointments. I also discussed with the patient that there may be a patient responsible charge related to this service. The patient expressed understanding and agreed to proceed.   History of Present Illness: Patient with a history of sickle cell anemia. He reports his pain being a 7/10 today. He does have home medications that he can take for this. Denies the need for the day hospital and will manage at home. He denies problems or the need for medication adjustment today.     Observations/Objective: Patient with regular voice tone, rate and rhythm. Speaking calmly and is in no apparent distress.     Assessment and Plan: 1. Hb-SS disease without crisis ([place]) - oxyCODONE-acetaminophen (PERCOCET) 10-325 MG tablet; Take 1 tablet by mouth every 6 (six) hours as needed for up to 15 days for pain.  Dispense: 60 tablet; Refill: 0 - oxyCODONE (OXYCONTIN) 20 mg 12 hr tablet; Take 1 tablet (20 mg total) by mouth every 12 (twelve) hours for 15 days.  Dispense: 30 tablet; Refill: 0    Follow Up Instructions: We discussed hand washing, using hand sanitizer when soap and water are not available, only going out when absolutely necessary, and social distancing. Explained to patient that he is immunocompromised and will need to take precautions during this time.     I discussed the assessment and treatment plan with the patient. The patient was provided an opportunity to ask questions and all were answered. The patient agreed with the plan and demonstrated an understanding of the instructions.    The patient was advised to call back or seek an in-person evaluation if the symptoms worsen or if the condition fails to improve as anticipated.  I provided 15 minutes of non-face-to-face time during this encounter.  [name], FNP-BC [facility] [address] [place], [place] [address] [phone]

## 2018-05-22 ENCOUNTER — Encounter (HOSPITAL_COMMUNITY): Payer: Self-pay | Admitting: Emergency Medicine

## 2018-05-22 ENCOUNTER — Emergency Department (HOSPITAL_COMMUNITY)
Admission: EM | Admit: 2018-05-22 | Discharge: 2018-05-22 | Disposition: A | Discharge status: Other Acute Inpatient Hospital | Payer: Medicaid Other | Source: Home / Self Care | Attending: Emergency Medicine | Admitting: Emergency Medicine

## 2018-05-22 ENCOUNTER — Other Ambulatory Visit: Payer: Self-pay

## 2018-05-22 ENCOUNTER — Emergency Department (HOSPITAL_COMMUNITY): Payer: Medicaid Other

## 2018-05-22 ENCOUNTER — Inpatient Hospital Stay (HOSPITAL_COMMUNITY)
Admission: AD | Admit: 2018-05-22 | Discharge: 2018-05-27 | DRG: 812 | Disposition: A | Discharge status: Home / Self Care | Payer: Medicaid Other | Source: Ambulatory Visit | Attending: Internal Medicine | Admitting: Internal Medicine

## 2018-05-22 DIAGNOSIS — D5700 Hb-SS disease with crisis, unspecified: Secondary | ICD-10-CM | POA: Diagnosis not present

## 2018-05-22 DIAGNOSIS — M879 Osteonecrosis, unspecified: Secondary | ICD-10-CM | POA: Diagnosis present

## 2018-05-22 DIAGNOSIS — Z7901 Long term (current) use of anticoagulants: Secondary | ICD-10-CM

## 2018-05-22 DIAGNOSIS — G894 Chronic pain syndrome: Secondary | ICD-10-CM | POA: Diagnosis present

## 2018-05-22 DIAGNOSIS — Z832 Family history of diseases of the blood and blood-forming organs and certain disorders involving the immune mechanism: Secondary | ICD-10-CM | POA: Diagnosis not present

## 2018-05-22 DIAGNOSIS — Z7982 Long term (current) use of aspirin: Secondary | ICD-10-CM | POA: Diagnosis not present

## 2018-05-22 DIAGNOSIS — F418 Other specified anxiety disorders: Secondary | ICD-10-CM | POA: Diagnosis present

## 2018-05-22 DIAGNOSIS — E876 Hypokalemia: Secondary | ICD-10-CM | POA: Diagnosis present

## 2018-05-22 DIAGNOSIS — Z885 Allergy status to narcotic agent status: Secondary | ICD-10-CM | POA: Diagnosis not present

## 2018-05-22 DIAGNOSIS — Z881 Allergy status to other antibiotic agents status: Secondary | ICD-10-CM | POA: Diagnosis not present

## 2018-05-22 DIAGNOSIS — E559 Vitamin D deficiency, unspecified: Secondary | ICD-10-CM | POA: Diagnosis present

## 2018-05-22 DIAGNOSIS — D638 Anemia in other chronic diseases classified elsewhere: Secondary | ICD-10-CM | POA: Diagnosis present

## 2018-05-22 DIAGNOSIS — R52 Pain, unspecified: Secondary | ICD-10-CM

## 2018-05-22 DIAGNOSIS — Z888 Allergy status to other drugs, medicaments and biological substances status: Secondary | ICD-10-CM

## 2018-05-22 LAB — CBC WITH DIFFERENTIAL/PLATELET
Abs Immature Granulocytes: 0.16 10*3/uL — ABNORMAL HIGH (ref 0.00–0.07)
Abs Immature Granulocytes: 0.12 10*3/uL — ABNORMAL HIGH (ref 0.00–0.07)
Basophils Absolute: 0 10*3/uL (ref 0.0–0.1)
Basophils Absolute: 0 10*3/uL (ref 0.0–0.1)
Basophils Relative: 0 %
Basophils Relative: 0 %
Eosinophils Absolute: 0.6 10*3/uL — ABNORMAL HIGH (ref 0.0–0.5)
Eosinophils Absolute: 0.2 10*3/uL (ref 0.0–0.5)
Eosinophils Relative: 4 %
Eosinophils Relative: 1 %
HCT: 26.9 % — ABNORMAL LOW (ref 39.0–52.0)
HCT: 26.5 % — ABNORMAL LOW (ref 39.0–52.0)
Hemoglobin: 9.2 g/dL — ABNORMAL LOW (ref 13.0–17.0)
Hemoglobin: 9.3 g/dL — ABNORMAL LOW (ref 13.0–17.0)
Immature Granulocytes: 1 %
Immature Granulocytes: 1 %
Lymphocytes Relative: 13 %
Lymphocytes Relative: 13 %
Lymphs Abs: 2 10*3/uL (ref 0.7–4.0)
Lymphs Abs: 2.5 10*3/uL (ref 0.7–4.0)
MCH: 29.5 pg (ref 26.0–34.0)
MCH: 29.8 pg (ref 26.0–34.0)
MCHC: 34.2 g/dL (ref 30.0–36.0)
MCHC: 35.1 g/dL (ref 30.0–36.0)
MCV: 86.2 fL (ref 80.0–100.0)
MCV: 84.9 fL (ref 80.0–100.0)
Monocytes Absolute: 1.2 10*3/uL — ABNORMAL HIGH (ref 0.1–1.0)
Monocytes Absolute: 1.4 10*3/uL — ABNORMAL HIGH (ref 0.1–1.0)
Monocytes Relative: 7 %
Monocytes Relative: 8 %
Neutro Abs: 12.1 10*3/uL — ABNORMAL HIGH (ref 1.7–7.7)
Neutro Abs: 14.2 10*3/uL — ABNORMAL HIGH (ref 1.7–7.7)
Neutrophils Relative %: 75 %
Neutrophils Relative %: 77 %
Platelets: 420 10*3/uL — ABNORMAL HIGH (ref 150–400)
Platelets: 332 10*3/uL (ref 150–400)
RBC: 3.12 MIL/uL — ABNORMAL LOW (ref 4.22–5.81)
RBC: 3.12 MIL/uL — ABNORMAL LOW (ref 4.22–5.81)
RDW: 21.9 % — ABNORMAL HIGH (ref 11.5–15.5)
RDW: 22.1 % — ABNORMAL HIGH (ref 11.5–15.5)
WBC: 16.1 10*3/uL — ABNORMAL HIGH (ref 4.0–10.5)
WBC: 18.4 10*3/uL — ABNORMAL HIGH (ref 4.0–10.5)
nRBC: 1.1 % — ABNORMAL HIGH (ref 0.0–0.2)
nRBC: 1 % — ABNORMAL HIGH (ref 0.0–0.2)

## 2018-05-22 LAB — COMPREHENSIVE METABOLIC PANEL
ALT: 9 U/L (ref 0–44)
AST: 22 U/L (ref 15–41)
Albumin: 4.5 g/dL (ref 3.5–5.0)
Alkaline Phosphatase: 70 U/L (ref 38–126)
Anion gap: 7 (ref 5–15)
BUN: 5 mg/dL — ABNORMAL LOW (ref 6–20)
CO2: 25 mmol/L (ref 22–32)
Calcium: 8.7 mg/dL — ABNORMAL LOW (ref 8.9–10.3)
Chloride: 105 mmol/L (ref 98–111)
Creatinine, Ser: 0.52 mg/dL — ABNORMAL LOW (ref 0.61–1.24)
GFR calc Af Amer: >60 mL/min (ref >60)
GFR calc non Af Amer: >60 mL/min (ref >60)
Glucose, Bld: 153 mg/dL — ABNORMAL HIGH (ref 70–99)
Potassium: 3.2 mmol/L — ABNORMAL LOW (ref 3.5–5.1)
Sodium: 137 mmol/L (ref 135–145)
Total Bilirubin: 3.5 mg/dL — ABNORMAL HIGH (ref 0.3–1.2)
Total Protein: 7.6 g/dL (ref 6.5–8.1)

## 2018-05-22 LAB — BASIC METABOLIC PANEL
Anion gap: 6 (ref 5–15)
BUN: 6 mg/dL (ref 6–20)
CO2: 27 mmol/L (ref 22–32)
Calcium: 9 mg/dL (ref 8.9–10.3)
Chloride: 106 mmol/L (ref 98–111)
Creatinine, Ser: 0.59 mg/dL — ABNORMAL LOW (ref 0.61–1.24)
GFR calc Af Amer: >60 mL/min (ref >60)
GFR calc non Af Amer: >60 mL/min (ref >60)
Glucose, Bld: 96 mg/dL (ref 70–99)
Potassium: 3.3 mmol/L — ABNORMAL LOW (ref 3.5–5.1)
Sodium: 139 mmol/L (ref 135–145)

## 2018-05-22 LAB — RETICULOCYTES
Immature Retic Fract: 35.6 % — ABNORMAL HIGH (ref 2.3–15.9)
RBC.: 3.12 MIL/uL — ABNORMAL LOW (ref 4.22–5.81)
Retic Count, Absolute: 324.2 10*3/uL — ABNORMAL HIGH (ref 19.0–186.0)
Retic Ct Pct: 10.4 % — ABNORMAL HIGH (ref 0.4–3.1)

## 2018-05-22 LAB — LACTATE DEHYDROGENASE: LDH: 283 U/L — ABNORMAL HIGH (ref 98–192)

## 2018-05-22 MED ORDER — OXYCODONE HCL ER 20 MG PO T12A
20.0000 mg | EXTENDED_RELEASE_TABLET | Freq: Two times a day (BID) | ORAL | Status: DC
  Administered 2018-05-22 – 2018-05-27 (×10): 20 mg via ORAL
  Filled 2018-05-22 (×10): qty 1

## 2018-05-22 MED ORDER — NALOXONE HCL 0.4 MG/ML IJ SOLN: 0.4000 mg | INTRAMUSCULAR | Status: DC | PRN

## 2018-05-22 MED ORDER — ONDANSETRON HCL 4 MG/2ML IJ SOLN
4.0000 mg | Freq: Four times a day (QID) | INTRAMUSCULAR | Status: DC | PRN
  Administered 2018-05-22 – 2018-05-26 (×4): 4 mg via INTRAVENOUS
  Filled 2018-05-22 (×4): qty 2

## 2018-05-22 MED ORDER — APIXABAN 5 MG PO TABS
5.0000 mg | ORAL_TABLET | Freq: Two times a day (BID) | ORAL | Status: DC
  Administered 2018-05-22 – 2018-05-23 (×3): 5 mg via ORAL
  Filled 2018-05-22 (×3): qty 1

## 2018-05-22 MED ORDER — HYDROXYUREA 500 MG PO CAPS
500.0000 mg | ORAL_CAPSULE | Freq: Two times a day (BID) | ORAL | Status: DC
  Administered 2018-05-22 – 2018-05-25 (×7): 500 mg via ORAL
  Filled 2018-05-22 (×8): qty 1

## 2018-05-22 MED ORDER — VITAMIN D3 25 MCG (1000 UNIT) PO TABS
2000.0000 [IU] | ORAL_TABLET | Freq: Every day | ORAL | Status: DC
  Administered 2018-05-24 – 2018-05-27 (×3): 2000 [IU] via ORAL
  Filled 2018-05-22 (×2): qty 2

## 2018-05-22 MED ORDER — HYDROMORPHONE HCL 2 MG/ML IJ SOLN
2.0000 mg | Freq: Once | INTRAMUSCULAR | Status: AC
  Administered 2018-05-22: 2 mg via INTRAVENOUS
  Filled 2018-05-22: qty 1

## 2018-05-22 MED ORDER — FOLIC ACID 1 MG PO TABS
1.0000 mg | ORAL_TABLET | Freq: Every day | ORAL | Status: DC
  Administered 2018-05-23 – 2018-05-27 (×5): 1 mg via ORAL
  Filled 2018-05-22 (×5): qty 1

## 2018-05-22 MED ORDER — IBUPROFEN 800 MG PO TABS
800.0000 mg | ORAL_TABLET | Freq: Three times a day (TID) | ORAL | Status: DC | PRN
  Administered 2018-05-23 – 2018-05-24 (×2): 800 mg via ORAL
  Filled 2018-05-22 (×3): qty 1

## 2018-05-22 MED ORDER — DIPHENHYDRAMINE HCL 12.5 MG/5ML PO ELIX
12.5000 mg | ORAL_SOLUTION | Freq: Four times a day (QID) | ORAL | Status: DC | PRN
  Administered 2018-05-22 – 2018-05-23 (×2): 12.5 mg via ORAL
  Filled 2018-05-22 (×2): qty 5

## 2018-05-22 MED ORDER — DEXTROSE-NACL 5-0.45 % IV SOLN
INTRAVENOUS | Status: DC
  Administered 2018-05-22 – 2018-05-27 (×9): via INTRAVENOUS

## 2018-05-22 MED ORDER — SODIUM CHLORIDE 0.9% FLUSH: 9.0000 mL | INTRAVENOUS | Status: DC | PRN

## 2018-05-22 MED ORDER — SENNOSIDES-DOCUSATE SODIUM 8.6-50 MG PO TABS
1.0000 | ORAL_TABLET | Freq: Two times a day (BID) | ORAL | Status: DC
  Administered 2018-05-22 – 2018-05-27 (×10): 1 via ORAL
  Filled 2018-05-22 (×10): qty 1

## 2018-05-22 MED ORDER — GABAPENTIN 300 MG PO CAPS
300.0000 mg | ORAL_CAPSULE | Freq: Three times a day (TID) | ORAL | Status: DC
  Administered 2018-05-22 – 2018-05-27 (×14): 300 mg via ORAL
  Filled 2018-05-22 (×14): qty 1

## 2018-05-22 MED ORDER — HYDROMORPHONE 1 MG/ML IV SOLN
INTRAVENOUS | Status: DC
  Administered 2018-05-22: 30 mg via INTRAVENOUS
  Filled 2018-05-22: qty 30

## 2018-05-22 MED ORDER — DIPHENHYDRAMINE HCL 50 MG/ML IJ SOLN
12.5000 mg | Freq: Four times a day (QID) | INTRAMUSCULAR | Status: DC | PRN
  Administered 2018-05-22 – 2018-05-23 (×2): 12.5 mg via INTRAVENOUS
  Filled 2018-05-22 (×2): qty 1

## 2018-05-22 MED ORDER — OXYCODONE HCL ER 20 MG PO T12A
30.0000 mg | EXTENDED_RELEASE_TABLET | Freq: Two times a day (BID) | ORAL | Status: DC
  Filled 2018-05-22: qty 2

## 2018-05-22 MED ORDER — POLYETHYLENE GLYCOL 3350 17 G PO PACK: 17.0000 g | PACK | Freq: Every day | ORAL | Status: DC | PRN

## 2018-05-22 MED ORDER — ONDANSETRON HCL 4 MG/2ML IJ SOLN
4.0000 mg | Freq: Once | INTRAMUSCULAR | Status: AC
  Administered 2018-05-22: 08:00:00 4 mg via INTRAVENOUS
  Filled 2018-05-22: qty 2

## 2018-05-22 MED ORDER — HYDROMORPHONE 1 MG/ML IV SOLN
INTRAVENOUS | Status: DC
  Administered 2018-05-22: 14.2 mg via INTRAVENOUS
  Administered 2018-05-22: 7.8 mg via INTRAVENOUS
  Administered 2018-05-23: 30 mg via INTRAVENOUS
  Administered 2018-05-23: 1.8 mg via INTRAVENOUS
  Administered 2018-05-23: 0.6 mg via INTRAVENOUS
  Administered 2018-05-23: 0 mg via INTRAVENOUS
  Administered 2018-05-24: 6 mg via INTRAVENOUS
  Administered 2018-05-24: 5.4 mg via INTRAVENOUS
  Administered 2018-05-24 – 2018-05-25 (×2): 1.8 mg via INTRAVENOUS
  Administered 2018-05-25: 1.2 mg via INTRAVENOUS
  Administered 2018-05-25: 30 mg via INTRAVENOUS
  Filled 2018-05-22 (×2): qty 30

## 2018-05-22 MED ORDER — ASPIRIN EC 81 MG PO TBEC
81.0000 mg | DELAYED_RELEASE_TABLET | Freq: Every day | ORAL | Status: DC
  Administered 2018-05-23 – 2018-05-27 (×5): 81 mg via ORAL
  Filled 2018-05-22 (×5): qty 1

## 2018-05-22 NOTE — ED Notes (Signed)
ED Provider at bedside. 

## 2018-05-22 NOTE — Progress Notes (Signed)
Error

## 2018-05-22 NOTE — Progress Notes (Signed)
Start of shift assessment, pt is not wearing pulse ox finger probe nor the CO2 detector. Resistant to having it put back on because of nausea, ( vomited approx 200 green emesis. Pt is loudly vocalizing discomfort, thrashing in bed, O2 sats mid 80s so he was placed on O2 @2lnc  with sats rising to low 90s.  Benadryl & Zofran given. Pt with many request and needing constant redirection and reminders to use his PCA as he is asking for pain maeds.

## 2018-05-22 NOTE — ED Triage Notes (Signed)
Patient arrived by EMS from home. Pt c/o pain all over from sickle cell crisis. Pt took at home medications w/ no relief.   EMS VS 118/68, HR 70, T 97.7 F, SpO2 100%.

## 2018-05-22 NOTE — Discharge Instructions (Signed)
Sickle Cell Anemia, Adult °Sickle cell anemia is a condition where your red blood cells are shaped like sickles. Red blood cells carry oxygen through the body. Sickle-shaped cells do not live as long as normal red blood cells. They also clump together and block blood from flowing through the blood vessels. This prevents the body from getting enough oxygen. Sickle cell anemia causes organ damage and pain. It also increases the risk of infection. °Follow these instructions at home: °Medicines °· Take over-the-counter and prescription medicines only as told by your doctor. °· If you were prescribed an antibiotic medicine, take it as told by your doctor. Do not stop taking the antibiotic even if you start to feel better. °· If you develop a fever, do not take medicines to lower the fever right away. Tell your doctor about the fever. °Managing pain, stiffness, and swelling °· Try these methods to help with pain: °? Use a heating pad. °? Take a warm bath. °? Distract yourself, such as by watching TV. °Eating and drinking °· Drink enough fluid to keep your pee (urine) clear or pale yellow. Drink more in hot weather and during exercise. °· Limit or avoid alcohol. °· Eat a healthy diet. Eat plenty of fruits, vegetables, whole grains, and lean protein. °· Take vitamins and supplements as told by your doctor. °Traveling °· When traveling, keep these with you: °? Your medical information. °? The names of your doctors. °? Your medicines. °· If you need to take an airplane, talk to your doctor first. °Activity °· Rest often. °· Avoid exercises that make your heart beat much faster, such as jogging. °General instructions °· Do not use products that have nicotine or tobacco, such as cigarettes and e-cigarettes. If you need help quitting, ask your doctor. °· Consider wearing a medical alert bracelet. °· Avoid being in high places (high altitudes), such as mountains. °· Avoid very hot or cold temperatures. °· Avoid places where the  temperature changes a lot. °· Keep all follow-up visits as told by your doctor. This is important. °Contact a doctor if: °· A joint hurts. °· Your feet or hands hurt or swell. °· You feel tired (fatigued). °Get help right away if: °· You have symptoms of infection. These include: °? Fever. °? Chills. °? Being very tired. °? Irritability. °? Poor eating. °? Throwing up (vomiting). °· You feel dizzy or faint. °· You have new stomach pain, especially on the left side. °· You have a an erection (priapism) that lasts more than 4 hours. °· You have numbness in your arms or legs. °· You have a hard time moving your arms or legs. °· You have trouble talking. °· You have pain that does not go away when you take medicine. °· You are short of breath. °· You are breathing fast. °· You have a long-term cough. °· You have pain in your chest. °· You have a bad headache. °· You have a stiff neck. °· Your stomach looks bloated even though you did not eat much. °· Your skin is pale. °· You suddenly cannot see well. °Summary °· Sickle cell anemia is a condition where your red blood cells are shaped like sickles. °· Follow your doctor's advice on ways to manage pain, food to eat, activities to do, and steps to take for safe travel. °· Get medical help right away if you have any signs of infection, such as a fever. °This information is not intended to replace advice given to you by your   health care provider. Make sure you discuss any questions you have with your health care provider. °Document Released: 11/21/2012 Document Revised: 03/08/2016 Document Reviewed: 03/08/2016 °Elsevier Interactive Patient Education © 2019 [employer] Inc[employer] ° °

## 2018-05-22 NOTE — ED Notes (Signed)
Bed: JP36 Expected date:  Expected time:  Means of arrival:  Comments: EMS 28 yo SCC

## 2018-05-22 NOTE — H&P (Signed)
Timothy Marks is an 28 y.o. male.   Chief Complaint: Pain in chest and left shoulder  HPI: Patient is a 28 year old gentleman with history of sickle cell disease who presents with pain in his chest wall left shoulder and back.  Pain has been going on for 3 days now.  Consistent with his typical sickle cell crisis.  Is rated as 9 out of 10.  He was first in the ER this morning and transferred to sickle cell patient care center.  Despite full dialysis treatment with Dilaudid PCA patient did not get relief.  He is therefore been transferred to the hospital for inpatient care.  Denied any nausea vomiting or diarrhea denied any fever.  Past Medical History:  Diagnosis Date  . Depression   . 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 Sickle cell anemia ([facility])   . Vitamin D deficiency y-6    Past Surgical History:  Procedure Laterality Date  . CHOLECYSTECTOMY      Family History  Problem Relation Age of Onset  . Sickle cell trait Mother   . Sickle cell trait Father    Social History:  reports that he has never smoked. He has never used smokeless tobacco. He reports that he does not drink alcohol or use drugs.  Allergies:  Allergies  Allergen Reactions  . Buprenorphine Hcl Hives  . Levaquin [Levofloxacin] Itching  . Meperidine Rash  . Morphine And Related Hives  . Toradol [Ketorolac Tromethamine] Itching  . Tramadol Hives  . Vancomycin Itching  . Zosyn [Piperacillin Sod-Tazobactam So] Itching and Rash    Has taken rocephin in past    Medications Prior to Admission  Medication Sig Dispense Refill  . aspirin EC 81 MG tablet Take 1 tablet (81 mg total) by mouth daily. 30 tablet 11  . cholecalciferol (VITAMIN D) 1000 units tablet Take 2 tablets (2,000 Units total) by mouth daily. 30 tablet 6  . ELIQUIS STARTER PACK (ELIQUIS STARTER PACK) 5 MG TABS Take as directed on package: start with two-5mg  tablets twice daily for 7 days. On  day 8, switch to one-5mg  tablet twice daily. 1 each 0  . folic acid (FOLVITE) 1 MG tablet Take 1 tablet (1 mg total) by mouth daily. 90 tablet 2  . gabapentin (NEURONTIN) 300 MG capsule TAKE 1 CAPSULE(300 MG) BY MOUTH THREE TIMES DAILY 90 capsule 0  . hydroxyurea (HYDREA) 500 MG capsule Take 1 capsule (500 mg total) by mouth 2 (two) times daily. May take with food to minimize GI side effects. 180 capsule 2  . ibuprofen (ADVIL,MOTRIN) 800 MG tablet Take 1 tablet (800 mg total) by mouth 3 (three) times daily as needed (pain). 30 tablet 0  . oxyCODONE (OXYCONTIN) 20 mg 12 hr tablet Take 1 tablet (20 mg total) by mouth every 12 (twelve) hours for 15 days. 30 tablet 0  . oxyCODONE-acetaminophen (PERCOCET) 10-325 MG tablet Take 1 tablet by mouth every 6 (six) hours as needed for up to 15 days for pain. 60 tablet 0    Results for orders placed or performed during the hospital encounter of 05/22/18 (from the past 48 hour(s))  CBC with Differential/Platelet     Status: Abnormal   Collection Time: 05/22/18  8:10 AM  Result Value Ref Range   WBC 16.1 (H) 4.0 - 10.5 K/uL    Comment: WHITE COUNT CONFIRMED ON SMEAR   RBC 3.12 (L) 4.22 - 5.81 MIL/uL   Hemoglobin 9.2 (  L) 13.0 - 17.0 g/dL   HCT 26.9 (L) 39.0 - 52.0 %   MCV 86.2 80.0 - 100.0 fL   MCH 29.5 26.0 - 34.0 pg   MCHC 34.2 30.0 - 36.0 g/dL   RDW 21.9 (H) 11.5 - 15.5 %   Platelets 420 (H) 150 - 400 K/uL   nRBC 1.1 (H) 0.0 - 0.2 %   Neutrophils Relative % 75 %   Neutro Abs 12.1 (H) 1.7 - 7.7 K/uL   Lymphocytes Relative 13 %   Lymphs Abs 2.0 0.7 - 4.0 K/uL   Monocytes Relative 7 %   Monocytes Absolute 1.2 (H) 0.1 - 1.0 K/uL   Eosinophils Relative 4 %   Eosinophils Absolute 0.6 (H) 0.0 - 0.5 K/uL   Basophils Relative 0 %   Basophils Absolute 0.0 0.0 - 0.1 K/uL   Immature Granulocytes 1 %   Abs Immature Granulocytes 0.16 (H) 0.00 - 0.07 K/uL   [name] Bodies PRESENT    Polychromasia PRESENT    Sickle Cells MARKED    Target Cells PRESENT      Comment: Performed at [facility], [place] [address], [place], [place] [address]  Basic metabolic panel     Status: Abnormal   Collection Time: 05/22/18  8:10 AM  Result Value Ref Range   Sodium 139 135 - 145 mmol/L   Potassium 3.3 (L) 3.5 - 5.1 mmol/L   Chloride 106 98 - 111 mmol/L   CO2 27 22 - 32 mmol/L   Glucose, Bld 96 70 - 99 mg/dL   BUN 6 6 - 20 mg/dL   Creatinine, Ser 0.59 (L) 0.61 - 1.24 mg/dL   Calcium 9.0 8.9 - 10.3 mg/dL   GFR calc non Af Amer >60 >60 mL/min   GFR calc Af Amer >60 >60 mL/min   Anion gap 6 5 - 15    Comment: Performed at [facility], [place] [address] [place], [place] [address]  Reticulocytes     Status: Abnormal   Collection Time: 05/22/18  8:10 AM  Result Value Ref Range   Retic Ct Pct 10.4 (H) 0.4 - 3.1 %   RBC. 3.12 (L) 4.22 - 5.81 MIL/uL   Retic Count, Absolute 324.2 (H) 19.0 - 186.0 K/uL   Immature Retic Fract 35.6 (H) 2.3 - 15.9 %    Comment: Performed at [facility], [place] [address], [place], [place] [address]   Dg Chest Port 1 View  Result Date: 05/22/2018 CLINICAL DATA:  Sickle cell crisis EXAM: PORTABLE CHEST 1 VIEW COMPARISON:  04/03/2018 FINDINGS: Cardiac shadow is within normal limits. The lungs are well aerated bilaterally. No focal infiltrate or sizable effusion is seen. Scattered calcified granulomas are noted within the left lung. Increased bony density is noted related to the underlying sickle cell disease is stable from the prior exam. No new focal abnormality is seen. IMPRESSION: No acute abnormality noted. Electronically Signed   By: Inez Catalina M.D.   On: 05/22/2018 08:24    Review of Systems  Constitutional: Negative.   HENT: Negative.   Eyes: Negative.   Respiratory: Negative.   Cardiovascular: Negative.   Gastrointestinal: Negative.   Genitourinary: Negative.   Musculoskeletal: Positive for back pain, joint pain and myalgias.  Skin: Negative.   Neurological:  Negative.   Endo/Heme/Allergies: Negative.     There were no vitals taken for this visit. Physical Exam  Constitutional: He is oriented to person, place, and time. He appears well-developed and  well-nourished.  HENT:  Head: Normocephalic and atraumatic.  Eyes: Pupils are equal, round, and reactive to light. Conjunctivae are normal.  Neck: Normal range of motion. Neck supple.  Cardiovascular: Normal rate, regular rhythm and normal heart sounds.  Respiratory: Effort normal and breath sounds normal.  GI: Soft. Bowel sounds are normal.  Musculoskeletal: Normal range of motion.        General: Tenderness and edema present.  Neurological: He is alert and oriented to person, place, and time.  Skin: Skin is warm and dry.  Psychiatric: He has a normal mood and affect.     Assessment/Plan A 28 year old gentleman being admitted with sickle cell painful crisis.  #1 sickle cell painful crisis: Patient will be admitted and maintained on Dilaudid PCA with Ibuprofen due to Toradol allergies.  Continue long-acting OxyContin.  IV fluids with D5 half-normal, other supportive care.  #2 chronic anticoagulation continue with apixaban: Continue treatment.  #3 anemia of chronic disease: H&H is at baseline.  Continue close monitoring.  Continue folic acid.  #4 depression with anxiety: Patient is anxious but generally stable.  Continue his home regimen.  Barbette Merino, MD 05/22/2018, 9:09 AM

## 2018-05-22 NOTE — ED Provider Notes (Addendum)
Timothy Marks DEPT Provider Note   CSN: [id number] Arrival date & time: 05/22/18  0745    History   Chief Complaint No chief complaint on file.   HPI Timothy Marks is a 28 y.o. male.     Patient is a 28 year old male with a history of sickle cell disease, prior VTE off Eliquis for the last 2 months any different activity yesterday.  He did not overly exert himself he was not outside.  He has not had cough, fever or shortness of breath.  He took his home pain medications without improvement of his pain.  Currently his pain is 10 out of 10.  It is painful to move, to breath or with palpation.  He denies any abdominal pain, leg pain or swelling.  He denies any vomiting or diarrhea.  Patient states this is typical presentation of prior pain crises.  Last transfusion was last year.  The history is provided by the patient.    Past Medical History:  Diagnosis Date  . Depression   . 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 Sickle cell anemia (H[facility])   . Vitamin D deficiency y-6    Patient Active Problem List   Diagnosis Date Noted  . Acute bilateral low back pain without sciatica   . Other chronic pain   . Anemia of chronic disease   . 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 Physical deconditioning   . Agitation   . Chronic pain syndrome   . Recurrent cold sores   . Fever   . Pain   . Sickle cell crisis ([place]) 07/16/2017  . Adjustment disorder with mixed disturbance of emotions and conduct   . Constipation 08/03/2014  . 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 Protein-calorie malnutrition, severe ([place]) 04/09/2014  . Major depressive disorder, recurrent, severe without psychotic features ([place])   . 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 Vitamin D deficiency   . Sickle cell anemia ([place]) 03/30/2014  . Hyperbilirubinemia 03/30/2014  . Hypokalemia 02/07/2014  . Aggressive behavior  01/25/2013  . Sickle cell pain crisis ([place]) 01/24/2013    Past Surgical History:  Procedure Laterality Date  . CHOLECYSTECTOMY          Home Medications    Prior to Admission medications   Medication Sig Start Date End Date Taking? Authorizing Provider  aspirin EC 81 MG tablet Take 1 tablet (81 mg total) by mouth daily. 06/02/17   Dorena Dew, FNP  cholecalciferol (VITAMIN D) 1000 units tablet Take 2 tablets (2,000 Units total) by mouth daily. 12/13/17   Azzie Glatter, FNP  ELIQUIS STARTER PACK (ELIQUIS STARTER PACK) 5 MG TABS Take as directed on package: start with two-5mg  tablets twice daily for 7 days. On day 8, switch to one-5mg  tablet twice daily. 12/13/17   Azzie Glatter, FNP  folic acid (FOLVITE) 1 MG tablet Take 1 tablet (1 mg total) by mouth daily. 05/01/17   Dorena Dew, FNP  gabapentin (NEURONTIN) 300 MG capsule TAKE 1 CAPSULE(300 MG) BY MOUTH THREE TIMES DAILY 05/08/18   Lanae Boast, FNP  hydroxyurea (HYDREA) 500 MG capsule Take 1 capsule (500 mg total) by mouth 2 (two) times daily. May take with food to minimize GI side effects. 05/01/17   Dorena Dew, FNP  ibuprofen (ADVIL,MOTRIN) 800 MG tablet Take 1 tablet (800 mg total) by mouth 3 (three) times daily as needed (pain). 12/20/17  Azzie Glatter, FNP  oxyCODONE (OXYCONTIN) 20 mg 12 hr tablet Take 1 tablet (20 mg total) by mouth every 12 (twelve) hours for 15 days. 05/18/18 06/02/18  Lanae Boast, FNP  oxyCODONE-acetaminophen (PERCOCET) 10-325 MG tablet Take 1 tablet by mouth every 6 (six) hours as needed for up to 15 days for pain. 05/18/18 06/02/18  Lanae Boast, FNP    Family History Family History  Problem Relation Age of Onset  . Sickle cell trait Mother   . Sickle cell trait Father     Social History Social History   Tobacco Use  . Smoking status: Never Smoker  . Smokeless tobacco: Never Used  Substance Use Topics  . Alcohol use: No  . Drug use: No    Types: Marijuana     Comment: denies use 05/01/2017     Allergies   Buprenorphine hcl; Levaquin [levofloxacin]; Meperidine; Morphine and related; Toradol [ketorolac tromethamine]; Tramadol; Vancomycin; and Zosyn [piperacillin sod-tazobactam so]   Review of Systems Review of Systems  All other systems reviewed and are negative.    Physical Exam Updated Vital Signs BP (!) 118/56 (BP Location: Right Arm)   Pulse 73   Temp 98.5 F (36.9 C) (Oral)   Resp 15   Ht 6\' 2"  (1.88 m)   Wt 58.1 kg   SpO2 98%   BMI 16.43 kg/m   Physical Exam Vitals signs and nursing note reviewed.  Constitutional:      General: He is in acute distress.     Appearance: He is well-developed and underweight.     Comments: Appears in pain  HENT:     Head: Normocephalic and atraumatic.     Nose: Nose normal.  Eyes:     Conjunctiva/sclera: Conjunctivae normal.     Pupils: Pupils are equal, round, and reactive to light.  Neck:     Musculoskeletal: Normal range of motion and neck supple.  Cardiovascular:     Rate and Rhythm: Normal rate and regular rhythm.     Pulses: Normal pulses.     Heart sounds: No murmur.  Pulmonary:     Effort: Pulmonary effort is normal. No respiratory distress.     Breath sounds: Normal breath sounds. No wheezing or rales.     Comments: Diffuse tenderness with palpation throughout the chest Chest:     Chest wall: Tenderness present.  Abdominal:     General: There is no distension.     Palpations: Abdomen is soft.     Tenderness: There is no abdominal tenderness. There is no guarding or rebound.  Musculoskeletal: Normal range of motion.        General: Tenderness present.     Right lower leg: No edema.     Left lower leg: No edema.     Comments: Diffuse muscular tenderness throughout the thoracic and lumbar spine.  No bony tenderness.  NO joint pain or effusion  Skin:    General: Skin is warm and dry.     Capillary Refill: Capillary refill takes less than 2 seconds.     Findings: No  erythema or rash.  Neurological:     General: No focal deficit present.     Mental Status: He is alert and oriented to person, place, and time. Mental status is at baseline.  Psychiatric:        Mood and Affect: Mood normal.        Behavior: Behavior normal.        Thought Content: Thought content normal.  ED Treatments / Results  Labs (all labs ordered are listed, but only abnormal results are displayed) Labs Reviewed  CBC WITH DIFFERENTIAL/PLATELET - Abnormal; Notable for the following components:      Result Value   WBC 16.1 (*)    RBC 3.12 (*)    Hemoglobin 9.2 (*)    HCT 26.9 (*)    RDW 21.9 (*)    Platelets 420 (*)    nRBC 1.1 (*)    All other components within normal limits  RETICULOCYTES - Abnormal; Notable for the following components:   Retic Ct Pct 10.4 (*)    RBC. 3.12 (*)    Retic Count, Absolute 324.2 (*)    Immature Retic Fract 35.6 (*)    All other components within normal limits  BASIC METABOLIC PANEL    EKG EKG Interpretation  Date/Time:  Tuesday May 22 2018 08:02:36 EDT Ventricular Rate:  75 PR Interval:    QRS Duration: 105 QT Interval:  368 QTC Calculation: 411 R Axis:   95 Text Interpretation:  Sinus rhythm Borderline right axis deviation Probable left ventricular hypertrophy No significant change since last tracing Confirmed by Blanchie Dessert ([id number]) on 05/22/2018 8:20:00 AM   Radiology Dg Chest Port 1 View  Result Date: 05/22/2018 CLINICAL DATA:  Sickle cell crisis EXAM: PORTABLE CHEST 1 VIEW COMPARISON:  04/03/2018 FINDINGS: Cardiac shadow is within normal limits. The lungs are well aerated bilaterally. No focal infiltrate or sizable effusion is seen. Scattered calcified granulomas are noted within the left lung. Increased bony density is noted related to the underlying sickle cell disease is stable from the prior exam. No new focal abnormality is seen. IMPRESSION: No acute abnormality noted. Electronically Signed   By: Inez Catalina  M.D.   On: 05/22/2018 08:24    Procedures Procedures (including critical care time)  Medications Ordered in ED Medications  HYDROmorphone (DILAUDID) injection 2 mg (has no administration in time range)  ondansetron (ZOFRAN) injection 4 mg (has no administration in time range)     Initial Impression / Assessment and Plan / ED Course  I have reviewed the triage vital signs and the nursing notes.  Pertinent labs & imaging results that were available during my care of the patient were reviewed by me and considered in my medical decision making (see chart for details).       Patient presenting with symptoms most consistent with a sickle cell pain crisis.  He denies any infectious symptoms or shortness of breath.  Patient is not tachycardic or hypoxic.  He does have a history of VTE and is been off Eliquis for several months because he was taken off by his physician.  Symptoms do not seem consistent with PE today.  Low suspicion for infectious etiology at this time based on patient's symptoms.  Breath sounds are clear bilaterally.  Low suspicion for coronavirus.  Low suspicion for ACS.  Labs are pending.  Patient given medication for his pain.  May be a candidate for the day hospital.  8:47 AM EKG and chest x-ray are within normal limits.  Patient's hemoglobin is stable at 9.2.  Feel that this is his typical sickle cell crisis.  Reticulocyte count of 10.  Spoke with Dr. Jonelle Sidle who is accepted him to the day hospital for ongoing pain management.  Randell Teare was evaluated in Emergency Department on 05/22/2018 for the symptoms described in the history of present illness. He was evaluated in the context of the global COVID-19 pandemic, which necessitated consideration  that the patient might be at risk for infection with the SARS-CoV-2 virus that causes COVID-19. Institutional protocols and algorithms that pertain to the evaluation of patients at risk for COVID-19 are in a state of rapid change based  on information released by regulatory bodies including the CDC and federal and state organizations. These policies and algorithms were followed during the patient's care in the ED.  Final Clinical Impressions(s) / ED Diagnoses   Final diagnoses:  Sickle cell pain crisis [facility])    ED Discharge Orders    None       Blanchie Dessert, MD 05/22/18 0160    Blanchie Dessert, MD 05/22/18 [phone]

## 2018-05-22 NOTE — Progress Notes (Signed)
Patient admitted to the day hospital for treatment of sickle cell pain crisis from the emergency room. Patient reported generalized pain rated 10/10 but especially in the back and left shoulder. Placed on Dilaudid PCA, given IV Zofran, PO Benadryl and hydrated with IV fluids. Patient was transferred to [facility] 5 E room 08. Report was giving to the receiving RN Andee Poles). Pain was 9/10 on transfer,  alert, oriented and transported in a wheelchair.

## 2018-05-23 LAB — COMPREHENSIVE METABOLIC PANEL
ALT: 9 U/L (ref 0–44)
AST: 24 U/L (ref 15–41)
Albumin: 4 g/dL (ref 3.5–5.0)
Alkaline Phosphatase: 67 U/L (ref 38–126)
Anion gap: 7 (ref 5–15)
BUN: 8 mg/dL (ref 6–20)
CO2: 27 mmol/L (ref 22–32)
Calcium: 8.4 mg/dL — ABNORMAL LOW (ref 8.9–10.3)
Chloride: 106 mmol/L (ref 98–111)
Creatinine, Ser: 0.59 mg/dL — ABNORMAL LOW (ref 0.61–1.24)
GFR calc Af Amer: >60 mL/min (ref >60)
GFR calc non Af Amer: >60 mL/min (ref >60)
Glucose, Bld: 95 mg/dL (ref 70–99)
Potassium: 3.2 mmol/L — ABNORMAL LOW (ref 3.5–5.1)
Sodium: 140 mmol/L (ref 135–145)
Total Bilirubin: 4.7 mg/dL — ABNORMAL HIGH (ref 0.3–1.2)
Total Protein: 6.9 g/dL (ref 6.5–8.1)

## 2018-05-23 LAB — CBC WITH DIFFERENTIAL/PLATELET
Abs Immature Granulocytes: 0.06 10*3/uL (ref 0.00–0.07)
Basophils Absolute: 0 10*3/uL (ref 0.0–0.1)
Basophils Relative: 0 %
Eosinophils Absolute: 0.2 10*3/uL (ref 0.0–0.5)
Eosinophils Relative: 1 %
HCT: 22 % — ABNORMAL LOW (ref 39.0–52.0)
Hemoglobin: 7.4 g/dL — ABNORMAL LOW (ref 13.0–17.0)
Immature Granulocytes: 0 %
Lymphocytes Relative: 25 %
Lymphs Abs: 3.7 10*3/uL (ref 0.7–4.0)
MCH: 28.5 pg (ref 26.0–34.0)
MCHC: 33.6 g/dL (ref 30.0–36.0)
MCV: 84.6 fL (ref 80.0–100.0)
Monocytes Absolute: 1.3 10*3/uL — ABNORMAL HIGH (ref 0.1–1.0)
Monocytes Relative: 9 %
Neutro Abs: 9.4 10*3/uL — ABNORMAL HIGH (ref 1.7–7.7)
Neutrophils Relative %: 65 %
Platelets: 348 10*3/uL (ref 150–400)
RBC: 2.6 MIL/uL — ABNORMAL LOW (ref 4.22–5.81)
RDW: 23.2 % — ABNORMAL HIGH (ref 11.5–15.5)
WBC: 14.7 10*3/uL — ABNORMAL HIGH (ref 4.0–10.5)
nRBC: 1.6 % — ABNORMAL HIGH (ref 0.0–0.2)

## 2018-05-23 MED ORDER — POTASSIUM CHLORIDE CRYS ER 20 MEQ PO TBCR
40.0000 meq | EXTENDED_RELEASE_TABLET | Freq: Once | ORAL | Status: AC
  Administered 2018-05-23: 40 meq via ORAL
  Filled 2018-05-23: qty 2

## 2018-05-23 MED ORDER — HALOPERIDOL LACTATE 5 MG/ML IJ SOLN
2.5000 mg | Freq: Once | INTRAMUSCULAR | Status: AC
  Administered 2018-05-23: 2.5 mg via INTRAVENOUS
  Filled 2018-05-23: qty 1

## 2018-05-23 NOTE — Progress Notes (Signed)
Pt has been unable to leave Co2 canula on,frequently calling out for pain medication, pt has been restless in bed, thrashing, loudly verbalizing discomfort, making animal sounds, hiccupping, playing with his penis, falling asleep mid thought, c/o itching all over, scratching. Pt is unable to tell me what his pain score is upon repeated questioning.  Hot packs offered for his shoulder but he puts them on his elbow.

## 2018-05-23 NOTE — Progress Notes (Signed)
Subjective: A 28 yo man with sickle cell disease admitted with sickle cell painful crisis. Patient was having pain at 9/10 yesterday but has been largely sleeping today. Has had haldol last night apparently. No NVD, No fever or chills  Objective: Vital signs in last 24 hours: Temp:  [97.7 F (36.5 C)-98.5 F (36.9 C)] 98 F (36.7 C) (04/08 1405) Pulse Rate:  [60-94] 66 (04/08 1405) Resp:  [9-20] 14 (04/08 1723) BP: (115-136)/(65-81) 121/67 (04/08 1405) SpO2:  [88 %-100 %] 100 % (04/08 1723) Weight change:  Last BM Date: 05/22/18  Intake/Output from previous day: 04/07 0701 - 04/08 0700 In: 1039.1 [I.V.:1039.1] Out: 600 [Urine:400; Emesis/NG output:200] Intake/Output this shift: No intake/output data recorded.  General appearance: alert, cooperative, appears stated age and no distress Head: Normocephalic, without obvious abnormality, atraumatic Eyes: conjunctivae/corneas clear. PERRL, EOM's intact. Fundi benign. Resp: clear to auscultation bilaterally Cardio: regular rate and rhythm, S1, S2 normal, no murmur, click, rub or gallop GI: soft, non-tender; bowel sounds normal; no masses,  no organomegaly Extremities: extremities normal, atraumatic, no cyanosis or edema Pulses: 2+ and symmetric Neurologic: Grossly normal  Lab Results: Recent Labs    05/22/18 1641 05/23/18 0949  WBC 18.4* 14.7*  HGB 9.3* 7.4*  HCT 26.5* 22.0*  PLT 332 348   BMET Recent Labs    05/22/18 1641 05/23/18 0949  NA 137 140  K 3.2* 3.2*  CL 105 106  CO2 25 27  GLUCOSE 153* 95  BUN 5* 8  CREATININE 0.52* 0.59*  CALCIUM 8.7* 8.4*    Studies/Results: Dg Chest Port 1 View  Result Date: 05/22/2018 CLINICAL DATA:  Sickle cell crisis EXAM: PORTABLE CHEST 1 VIEW COMPARISON:  04/03/2018 FINDINGS: Cardiac shadow is within normal limits. The lungs are well aerated bilaterally. No focal infiltrate or sizable effusion is seen. Scattered calcified granulomas are noted within the left lung. Increased  bony density is noted related to the underlying sickle cell disease is stable from the prior exam. No new focal abnormality is seen. IMPRESSION: No acute abnormality noted. Electronically Signed   By: Inez Catalina M.D.   On: 05/22/2018 08:24    Medications: I have reviewed the patient's current medications.  Assessment/Plan: A 28 yo admitted with sickle cell painful crisis.   #1. Sickle Cell Painful crisis: Patient basely using the PCA due to sleepiness but arousable. Will continue the PCA and NSAID but avoid any extra narcotic. Mobilize patient and if able to move, consider DC in am.  #2 Hypokalemia: Replete potassium  #3 Anemia of Chronic disease: H/H has dropped overnight is stable. Continue treatment  #4 Leucocytosis: Due to [place]. Monitor   LOS: 1 day   GARBA,LAWAL 05/23/2018, 6:08 PM

## 2018-05-23 NOTE — Progress Notes (Signed)
PCA pump has been restarted as pt is crying hysterically that he cant move his arm / cant straighten his arm because of the shoulder pain however he is moving his arm. Calling people on his phone screaming in pain. Heating pad that he had been laying on is now under his shoulder & folded over onto his shoulder.

## 2018-05-23 NOTE — Progress Notes (Signed)
PCA pump has been turned off per policy guidelines since pt is unable to manage the pump appropriately  not wearing the Co2 canula.

## 2018-05-24 ENCOUNTER — Inpatient Hospital Stay (HOSPITAL_COMMUNITY): Payer: Medicaid Other

## 2018-05-24 LAB — CBC WITH DIFFERENTIAL/PLATELET
Abs Immature Granulocytes: 0.07 10*3/uL (ref 0.00–0.07)
Basophils Absolute: 0 10*3/uL (ref 0.0–0.1)
Basophils Relative: 0 %
Eosinophils Absolute: 0.2 10*3/uL (ref 0.0–0.5)
Eosinophils Relative: 1 %
HCT: 21.3 % — ABNORMAL LOW (ref 39.0–52.0)
Hemoglobin: 7.3 g/dL — ABNORMAL LOW (ref 13.0–17.0)
Immature Granulocytes: 1 %
Lymphocytes Relative: 19 %
Lymphs Abs: 2.8 10*3/uL (ref 0.7–4.0)
MCH: 29.7 pg (ref 26.0–34.0)
MCHC: 34.3 g/dL (ref 30.0–36.0)
MCV: 86.6 fL (ref 80.0–100.0)
Monocytes Absolute: 1.1 10*3/uL — ABNORMAL HIGH (ref 0.1–1.0)
Monocytes Relative: 7 %
Neutro Abs: 10.9 10*3/uL — ABNORMAL HIGH (ref 1.7–7.7)
Neutrophils Relative %: 72 %
Platelets: 331 10*3/uL (ref 150–400)
RBC: 2.46 MIL/uL — ABNORMAL LOW (ref 4.22–5.81)
RDW: 22.8 % — ABNORMAL HIGH (ref 11.5–15.5)
WBC: 15 10*3/uL — ABNORMAL HIGH (ref 4.0–10.5)
nRBC: 1.1 % — ABNORMAL HIGH (ref 0.0–0.2)

## 2018-05-24 LAB — BASIC METABOLIC PANEL
Anion gap: 7 (ref 5–15)
BUN: 7 mg/dL (ref 6–20)
CO2: 28 mmol/L (ref 22–32)
Calcium: 8.4 mg/dL — ABNORMAL LOW (ref 8.9–10.3)
Chloride: 105 mmol/L (ref 98–111)
Creatinine, Ser: 0.53 mg/dL — ABNORMAL LOW (ref 0.61–1.24)
GFR calc Af Amer: >60 mL/min (ref >60)
GFR calc non Af Amer: >60 mL/min (ref >60)
Glucose, Bld: 98 mg/dL (ref 70–99)
Potassium: 3.3 mmol/L — ABNORMAL LOW (ref 3.5–5.1)
Sodium: 140 mmol/L (ref 135–145)

## 2018-05-24 MED ORDER — POTASSIUM CHLORIDE CRYS ER 20 MEQ PO TBCR
40.0000 meq | EXTENDED_RELEASE_TABLET | Freq: Two times a day (BID) | ORAL | Status: AC
  Administered 2018-05-24 – 2018-05-25 (×2): 40 meq via ORAL
  Filled 2018-05-24 (×2): qty 2

## 2018-05-24 NOTE — Progress Notes (Deleted)
CRITICAL VALUE ALERT  Critical Value:  K+ 6.0  Date & Time Notied:    Provider Notified:   Orders Received/Actions taken:

## 2018-05-24 NOTE — Progress Notes (Signed)
Subjective: Patient still has pain at 8/10 especially in his left shoulder. Has not been using much of his Dilaudid PCA. Has used 13 mg overnight. His pain is worse when he moves his left shoulder which has limited range of motion.  Objective: Vital signs in last 24 hours: Temp:  [98 F (36.7 C)-100.6 F (38.1 C)] 100.6 F (38.1 C) (04/09 0525) Pulse Rate:  [65-100] 100 (04/09 0525) Resp:  [11-20] 12 (04/09 0754) BP: (117-121)/(57-73) 121/57 (04/09 0525) SpO2:  [95 %-100 %] 99 % (04/09 0754) Weight change:  Last BM Date: 05/22/18  Intake/Output from previous day: 04/08 0701 - 04/09 0700 In: -  Out: 1100 [Urine:1100] Intake/Output this shift: No intake/output data recorded.  General appearance: alert, cooperative, appears stated age and no distress Head: Normocephalic, without obvious abnormality, atraumatic Eyes: conjunctivae/corneas clear. PERRL, EOM's intact. Fundi benign. Resp: clear to auscultation bilaterally Cardio: regular rate and rhythm, S1, S2 normal, no murmur, click, rub or gallop GI: soft, non-tender; bowel sounds normal; no masses,  no organomegaly Extremities: extremities normal, atraumatic, no cyanosis or edema Pulses: 2+ and symmetric Neurologic: Grossly normal  Lab Results: Recent Labs    05/22/18 1641 05/23/18 0949  WBC 18.4* 14.7*  HGB 9.3* 7.4*  HCT 26.5* 22.0*  PLT 332 348   BMET Recent Labs    05/22/18 1641 05/23/18 0949  NA 137 140  K 3.2* 3.2*  CL 105 106  CO2 25 27  GLUCOSE 153* 95  BUN 5* 8  CREATININE 0.52* 0.59*  CALCIUM 8.7* 8.4*    Studies/Results: No results found.  Medications: I have reviewed the patient's current medications.  Assessment/Plan: A 28 yo admitted with sickle cell painful crisis.   #1. Sickle Cell Painful crisis: Patient will get X ray of the left shoulder to evaluate for any abnormality. Most likely infarcted the shoulder joint. Will continue current treatment and consider PT. If better, can be  discharged.  #2 Hypokalemia: Replete potassium  #3 Anemia of Chronic disease: H/H is stable. Continue treatment  #4 Leucocytosis: Due to [place]. Monitor   LOS: 2 days   GARBA,LAWAL 05/24/2018, 9:19 AM

## 2018-05-25 MED ORDER — OXYCODONE HCL 5 MG PO TABS
5.0000 mg | ORAL_TABLET | ORAL | Status: DC
  Administered 2018-05-25 – 2018-05-27 (×10): 5 mg via ORAL
  Filled 2018-05-25 (×10): qty 1

## 2018-05-25 MED ORDER — OXYCODONE-ACETAMINOPHEN 10-325 MG PO TABS: 1.0000 | ORAL_TABLET | ORAL | Status: DC

## 2018-05-25 MED ORDER — HYDROMORPHONE 1 MG/ML IV SOLN
INTRAVENOUS | Status: DC
  Administered 2018-05-26: 1.8 mg via INTRAVENOUS
  Administered 2018-05-26: 0.8 mg via INTRAVENOUS
  Administered 2018-05-26: 30 mg via INTRAVENOUS
  Administered 2018-05-26: 2 mg via INTRAVENOUS
  Administered 2018-05-27: 2.8 mg via INTRAVENOUS
  Administered 2018-05-27: 0.8 mg via INTRAVENOUS
  Filled 2018-05-25: qty 30

## 2018-05-25 MED ORDER — OXYCODONE-ACETAMINOPHEN 5-325 MG PO TABS
1.0000 | ORAL_TABLET | ORAL | Status: DC
  Administered 2018-05-25 – 2018-05-27 (×10): 1 via ORAL
  Filled 2018-05-25 (×10): qty 1

## 2018-05-25 NOTE — Evaluation (Signed)
Physical Therapy Evaluation Patient Details Name: Timothy Marks MRN: [id number] DOB: [birth year] Today's Date: 05/25/2018   History of Present Illness  28 yo male admitted for sickle cell crisis with significant L shoulder pain. PMH includes depression, osteonecrosis of L shoulder, sickle cell anemia, LBP.  Clinical Impression   Pt presents with severe L scapular and back pain, decreased musculature of L paraspinal muscles, R rib pain, difficulty performing bed mobility, and decreased activity tolerance due to pain crisis. Pt to benefit from acute PT to address deficits. Pt very limited this session by pain, unable to perform OOB mobility at this time. Per PT exam, pt mostly with L scapular, L upper trapezius, and L sided back pain. PT suspects pain is due to pain crisis, and less to do with L shoulder at this time. However, PT unable to rule out shoulder involvement due to pt's high pain response to palpation and ROM in all directions and in all areas. PT to progress mobility as tolerated, and will continue to follow acutely.      Follow Up Recommendations No PT follow up;Supervision - Intermittent    Equipment Recommendations  None recommended by PT    Recommendations for Other Services       Precautions / Restrictions Precautions Precautions: Fall(moderate) Restrictions Weight Bearing Restrictions: No      Mobility  Bed Mobility Overal bed mobility: Needs Assistance Bed Mobility: Supine to Sit;Sit to Supine;Rolling Rolling: Supervision   Supine to sit: HOB elevated;Mod assist Sit to supine: Min assist;HOB elevated   General bed mobility comments: Mod assist for supine to sit for trunk elevation, pt very painful along R ribs due to pain crisis which limited pt. Min assist for LE management for sit>supine. Pt demonstrated rolling and bridging without physical assist, required increased time.    Transfers Overall transfer level: (NT - pt in too much pain, only able to tolerate  sitting EOB. )                  Ambulation/Gait                Stairs            Wheelchair Mobility    Modified Rankin (Stroke Patients Only)       Balance Overall balance assessment: Mild deficits observed, not formally tested                                           Pertinent Vitals/Pain Pain Assessment: Faces Pain Score: 10-Worst pain ever Pain Descriptors / Indicators: Crying;Aching;Grimacing;Guarding Pain Intervention(s): Limited activity within patient's tolerance;Monitored during session;Repositioned;PCA encouraged;Heat applied    Home Living Family/patient expects to be discharged to:: Private residence Living Arrangements: Parent;Spouse/significant other(Pt reports mostly living with girlfriend in her apt, but occasionally stays with mother) Available Help at Discharge: Family;Available PRN/intermittently Type of Home: Apartment Home Access: Stairs to enter   Entrance Stairs-Number of Steps: 3 Home Layout: One level Home Equipment: None      Prior Function Level of Independence: Independent               Hand Dominance   Dominant Hand: Right    Extremity/Trunk Assessment   Upper Extremity Assessment Upper Extremity Assessment: LUE deficits/detail LUE Deficits / Details: able to perform active assisted shoulder elevation and abduction to 90*, very limited by pain. TTP along L medial and lateral border  of scapula, L paraspinals, and L upper trapezius. Not TTP - anterior/middle deltoid, origin of long head of biceps, belly of biceps brachii, acromion. LUE: Unable to fully assess due to pain    Lower Extremity Assessment Lower Extremity Assessment: Generalized weakness    Cervical / Trunk Assessment Cervical / Trunk Assessment: Other exceptions Cervical / Trunk Exceptions: Pt with assymetry of lumbar paraspinal musculature, with more muscular definition on R side vs L. No scoliosis detected.   Communication    Communication: No difficulties  Cognition Arousal/Alertness: Awake/alert Behavior During Therapy: WFL for tasks assessed/performed Overall Cognitive Status: No family/caregiver present to determine baseline cognitive functioning                                 General Comments: Upon arrival to room, pt talking to an owl in the room and hooting at it (owl not present). Pt also talking to someone not in the room, stating "I'll be there soon, I will come eat soon".       General Comments      Exercises Other Exercises Other Exercises: cervical lateral flexion to R for upper trap stretching, hold 30 seconds.  Other Exercises: cervical forward flexion, hold 30 seconds.  Other Exercises: levator scapulae stretch to R to stretch L trapezius and levator scapulae, hold 30 seconds.   Assessment/Plan    PT Assessment Patient needs continued PT services  PT Problem List Decreased strength;Decreased mobility;Decreased range of motion;Decreased activity tolerance;Pain       PT Treatment Interventions Gait training;Therapeutic activities;Therapeutic exercise;Functional mobility training;Patient/family education    PT Goals (Current goals can be found in the Care Plan section)  Acute Rehab PT Goals Patient Stated Goal: decrease my L shoulder pain  PT Goal Formulation: With patient Time For Goal Achievement: 06/08/18 Potential to Achieve Goals: Good    Frequency Min 3X/week   Barriers to discharge        Co-evaluation               AM-PAC PT "6 Clicks" Mobility  Outcome Measure Help needed turning from your back to your side while in a flat bed without using bedrails?: A Little Help needed moving from lying on your back to sitting on the side of a flat bed without using bedrails?: A Little Help needed moving to and from a bed to a chair (including a wheelchair)?: A Little Help needed standing up from a chair using your arms (e.g., wheelchair or bedside chair)?: A  Little Help needed to walk in hospital room?: A Lot Help needed climbing 3-5 steps with a railing? : A Lot 6 Click Score: 16    End of Session   Activity Tolerance: Patient limited by pain Patient left: in bed;with bed alarm set;with call bell/phone within reach Nurse Communication: Mobility status PT Visit Diagnosis: Other abnormalities of gait and mobility (R26.89);Pain Pain - Right/Left: Left Pain - part of body: Shoulder    Time: 1638-4536 PT Time Calculation (min) (ACUTE ONLY): 35 min   Charges:   PT Evaluation $PT Eval Low Complexity: 1 Low PT Treatments $Therapeutic Exercise: 8-22 mins       Eryka Dolinger Conception Chancy, PT Acute Rehabilitation Services Pager [phone]  Office [phone]  Jeffory Snelgrove D [place] Hollar 05/25/2018, 5:11 PM

## 2018-05-25 NOTE — Progress Notes (Signed)
Subjective: Patient continues to have pain at high levels.  Pain is at 7 out of 10 mainly in his left shoulder.  Not able to move the shoulder adequately.  He is not using much of the Dilaudid PCA but has used 24 mg in the last 24 hours.  He is mostly laying down in bed.  Reportedly patient has been aggressive towards the nurses at some point.  Objective: Vital signs in last 24 hours: Temp:  [98.3 F (36.8 C)-98.8 F (37.1 C)] 98.3 F (36.8 C) (04/10 2100) Pulse Rate:  [83-95] 95 (04/10 2100) Resp:  [16-20] 18 (04/10 2100) BP: (119-135)/(72-80) 123/80 (04/10 2100) SpO2:  [98 %-100 %] 100 % (04/10 2100) Weight change:  Last BM Date: 05/22/18  Intake/Output from previous day: 04/09 0701 - 04/10 0700 In: 1710 [P.O.:120; I.V.:1590] Out: 1275 [Urine:1275] Intake/Output this shift: No intake/output data recorded.  General appearance: alert, cooperative, appears stated age and no distress Head: Normocephalic, without obvious abnormality, atraumatic Eyes: conjunctivae/corneas clear. PERRL, EOM's intact. Fundi benign. Resp: clear to auscultation bilaterally Cardio: regular rate and rhythm, S1, S2 normal, no murmur, click, rub or gallop GI: soft, non-tender; bowel sounds normal; no masses,  no organomegaly Extremities: extremities normal, atraumatic, no cyanosis or edema Pulses: 2+ and symmetric Neurologic: Grossly normal  Lab Results: Recent Labs    05/23/18 0949 05/24/18 0939  WBC 14.7* 15.0*  HGB 7.4* 7.3*  HCT 22.0* 21.3*  PLT 348 331   BMET Recent Labs    05/23/18 0949 05/24/18 0939  NA 140 140  K 3.2* 3.3*  CL 106 105  CO2 27 28  GLUCOSE 95 98  BUN 8 7  CREATININE 0.59* 0.53*  CALCIUM 8.4* 8.4*    Studies/Results: Dg Shoulder Left  Result Date: 05/24/2018 CLINICAL DATA:  Left shoulder pain, sickle cell. EXAM: LEFT SHOULDER - 2+ VIEW COMPARISON:  04/07/2014 FINDINGS: There is no evidence of fracture or dislocation. There is no evidence of arthropathy or other  focal bone abnormality. Soft tissues are unremarkable. IMPRESSION: Negative. Electronically Signed   By: Rolm Baptise M.D.   On: 05/24/2018 13:31    Medications: I have reviewed the patient's current medications.  Assessment/Plan: A 28 yo admitted with sickle cell painful crisis.   #1. Sickle Cell Painful crisis: Patient had x-ray of the left shoulder showing no significant abnormalities.  He still has significant pain.  We will get physical therapy consultation to evaluate patient and advise.  Continue Dilaudid PCA.  Will titrate it down and reorder his home short acting medications.  #2 Hypokalemia: Continue to replete potassium  #3 Anemia of Chronic disease: H/H is stable. Continue treatment  #4 Leucocytosis: Due to [place]. Monitor   LOS: 3 days   Maxene Byington,LAWAL 05/25/2018, 11:16 PM

## 2018-05-26 LAB — CBC WITH DIFFERENTIAL/PLATELET
Abs Immature Granulocytes: 0.07 10*3/uL (ref 0.00–0.07)
Basophils Absolute: 0 10*3/uL (ref 0.0–0.1)
Basophils Relative: 0 %
Eosinophils Absolute: 0.2 10*3/uL (ref 0.0–0.5)
Eosinophils Relative: 1 %
HCT: 16.7 % — ABNORMAL LOW (ref 39.0–52.0)
Hemoglobin: 5.6 g/dL — CL (ref 13.0–17.0)
Immature Granulocytes: 1 %
Lymphocytes Relative: 10 %
Lymphs Abs: 1.5 10*3/uL (ref 0.7–4.0)
MCH: 28.9 pg (ref 26.0–34.0)
MCHC: 33.5 g/dL (ref 30.0–36.0)
MCV: 86.1 fL (ref 80.0–100.0)
Monocytes Absolute: 0.8 10*3/uL (ref 0.1–1.0)
Monocytes Relative: 5 %
Neutro Abs: 13 10*3/uL — ABNORMAL HIGH (ref 1.7–7.7)
Neutrophils Relative %: 83 %
Platelets: 395 10*3/uL (ref 150–400)
RBC: 1.94 MIL/uL — ABNORMAL LOW (ref 4.22–5.81)
RDW: 22.6 % — ABNORMAL HIGH (ref 11.5–15.5)
WBC: 15.5 10*3/uL — ABNORMAL HIGH (ref 4.0–10.5)
nRBC: 1.5 % — ABNORMAL HIGH (ref 0.0–0.2)

## 2018-05-26 LAB — PREPARE RBC (CROSSMATCH)

## 2018-05-26 MED ORDER — SODIUM CHLORIDE 0.9% IV SOLUTION: Freq: Once | INTRAVENOUS | Status: DC

## 2018-05-26 NOTE — Progress Notes (Signed)
Subjective: Patient is still having pain in his left shoulder.  He has started doing some physical therapy.  He was noted to have dropped his hemoglobin to 5.6.  Denies any fever or chills no nausea vomiting no diarrhea.  Patient has been largely in bed.  Counseled on moving.  Still on Dilaudid PCA and appears to be using is some more.  Objective: Vital signs in last 24 hours: Temp:  [98.2 F (36.8 C)-99.3 F (37.4 C)] 99.3 F (37.4 C) (04/11 2025) Pulse Rate:  [83-110] 85 (04/11 2025) Resp:  [11-20] 20 (04/11 2025) BP: (115-129)/(69-76) 122/71 (04/11 2025) SpO2:  [92 %-100 %] 97 % (04/11 2025) Weight change:  Last BM Date: 05/22/18  Intake/Output from previous day: 04/10 0701 - 04/11 0700 In: 2110.9 [P.O.:480; I.V.:1630.9] Out: 300 [Urine:300] Intake/Output this shift: No intake/output data recorded.  General appearance: alert, cooperative, appears stated age and no distress Head: Normocephalic, without obvious abnormality, atraumatic Eyes: conjunctivae/corneas clear. PERRL, EOM's intact. Fundi benign. Resp: clear to auscultation bilaterally Cardio: regular rate and rhythm, S1, S2 normal, no murmur, click, rub or gallop GI: soft, non-tender; bowel sounds normal; no masses,  no organomegaly Extremities: extremities normal, atraumatic, no cyanosis or edema Pulses: 2+ and symmetric Neurologic: Grossly normal  Lab Results: Recent Labs    05/24/18 0939 05/26/18 1027  WBC 15.0* 15.5*  HGB 7.3* 5.6*  HCT 21.3* 16.7*  PLT 331 395   BMET Recent Labs    05/24/18 0939  NA 140  K 3.3*  CL 105  CO2 28  GLUCOSE 98  BUN 7  CREATININE 0.53*  CALCIUM 8.4*    Studies/Results: No results found.  Medications: I have reviewed the patient's current medications.  Assessment/Plan: A 28 yo admitted with sickle cell painful crisis.   #1. Sickle Cell Painful crisis: Patient will continue with physical therapy for his left shoulder.  Pain control will continue with Dilaudid PCA.   Will titrate it down and reorder his home short acting medications.  #2 Hypokalemia: Continue to replete potassium  #3 Anemia of Chronic disease: H/H is dropped from 7.3-5.6.  Evidence of hemolytic crisis.  I will transfuse 1 unit of packed red blood cells.  Recheck H&H in the morning.  #4 Leucocytosis: Due to [place]. Monitor   LOS: 4 days   GARBA,LAWAL 05/26/2018, 11:40 PM

## 2018-05-26 NOTE — Progress Notes (Signed)
PHARMACIST - PHYSICIAN COMMUNICATION  CONCERNING: Hydroxyurea   Patient's hemoglobin today is 5.6, which is below the acceptable threshold for hydroxyurea therapy. Therefore, the order has been temporarily discontinued due to this unmet lab parameter. Please reorder when hemoglobin increases to >6.   Hold criteria for hydroxyurea:  ANC < 2 Pltc < 80K in sickle-cell patients; < 100K in other patients Hgb < 6 in sickle-cell patients; < 8 in other patients Reticulocytes < 80K when Hgb < 9     Lindell Spar, PharmD, BCPS Pager: [phone] 05/26/2018 11:22 AM

## 2018-05-27 LAB — COMPREHENSIVE METABOLIC PANEL WITH GFR
ALT: 13 U/L (ref 0–44)
AST: 29 U/L (ref 15–41)
Albumin: 4 g/dL (ref 3.5–5.0)
Alkaline Phosphatase: 61 U/L (ref 38–126)
Anion gap: 9 (ref 5–15)
BUN: 7 mg/dL (ref 6–20)
CO2: 29 mmol/L (ref 22–32)
Calcium: 8.8 mg/dL — ABNORMAL LOW (ref 8.9–10.3)
Chloride: 103 mmol/L (ref 98–111)
Creatinine, Ser: 0.51 mg/dL — ABNORMAL LOW (ref 0.61–1.24)
GFR calc Af Amer: >60 mL/min
GFR calc non Af Amer: >60 mL/min
Glucose, Bld: 102 mg/dL — ABNORMAL HIGH (ref 70–99)
Potassium: 3.8 mmol/L (ref 3.5–5.1)
Sodium: 141 mmol/L (ref 135–145)
Total Bilirubin: 4.1 mg/dL — ABNORMAL HIGH (ref 0.3–1.2)
Total Protein: 7.6 g/dL (ref 6.5–8.1)

## 2018-05-27 LAB — CBC WITH DIFFERENTIAL/PLATELET
Abs Immature Granulocytes: 0.04 10*3/uL (ref 0.00–0.07)
Basophils Absolute: 0 10*3/uL (ref 0.0–0.1)
Basophils Relative: 0 %
Eosinophils Absolute: 0.3 10*3/uL (ref 0.0–0.5)
Eosinophils Relative: 3 %
HCT: 20.1 % — ABNORMAL LOW (ref 39.0–52.0)
Hemoglobin: 7 g/dL — ABNORMAL LOW (ref 13.0–17.0)
Immature Granulocytes: 0 %
Lymphocytes Relative: 26 %
Lymphs Abs: 2.7 10*3/uL (ref 0.7–4.0)
MCH: 29.5 pg (ref 26.0–34.0)
MCHC: 34.8 g/dL (ref 30.0–36.0)
MCV: 84.8 fL (ref 80.0–100.0)
Monocytes Absolute: 0.8 10*3/uL (ref 0.1–1.0)
Monocytes Relative: 7 %
Neutro Abs: 6.8 10*3/uL (ref 1.7–7.7)
Neutrophils Relative %: 64 %
Platelets: 403 10*3/uL — ABNORMAL HIGH (ref 150–400)
RBC: 2.37 MIL/uL — ABNORMAL LOW (ref 4.22–5.81)
RDW: 19.9 % — ABNORMAL HIGH (ref 11.5–15.5)
WBC: 10.7 10*3/uL — ABNORMAL HIGH (ref 4.0–10.5)
nRBC: 5.2 % — ABNORMAL HIGH (ref 0.0–0.2)

## 2018-05-27 LAB — BPAM RBC
Blood Product Expiration Date: 202005012359
ISSUE DATE / TIME: 202004111808
Unit Type and Rh: 9500

## 2018-05-27 LAB — TYPE AND SCREEN
ABO/RH(D): A POS
Antibody Screen: NEGATIVE
Donor AG Type: NEGATIVE
Unit division: 0

## 2018-05-27 MED ORDER — TRAZODONE HCL 50 MG PO TABS
50.0000 mg | ORAL_TABLET | Freq: Once | ORAL | Status: AC
  Administered 2018-05-27: 01:00:00 50 mg via ORAL
  Filled 2018-05-27: qty 1

## 2018-05-27 MED ORDER — MUSCLE RUB 10-15 % EX CREA
TOPICAL_CREAM | CUTANEOUS | Status: DC | PRN
  Administered 2018-05-27: 06:00:00 via TOPICAL
  Filled 2018-05-27: qty 85

## 2018-05-27 NOTE — Progress Notes (Signed)
This shift  pts PCA pump was off due to blood transfusing. Was advisd by previous RN, this is at the req of pt and can restart when transfusion is complete.  Pt aware that he is unable to use the PCA to manage pain at this time. This RN restarted at approx 2245. Will continue to monitor

## 2018-05-27 NOTE — Progress Notes (Signed)
Pt continues to c/o L shoulder pain. Heat has already been applied. This Rn provided gentle massage to the affected area, suggestion made to req muscle rub.

## 2018-05-27 NOTE — Discharge Summary (Signed)
Physician Discharge Summary  Patient ID: Timothy Marks MRN: [id number] DOB/AGE: 04-28-1990 28 y.o.  Admit date: 05/22/2018 Discharge date: 05/27/2018  Admission Diagnoses:  Discharge Diagnoses:  Principal Problem:   Sickle cell pain crisis ([place]) Active Problems:   Chronic pain syndrome   Sickle cell anemia with crisis [facility])   Discharged Condition: good  Hospital Course: Patient is a 28 year old gentleman that initially presented to the emergency room and then the sickle cell day hospital with left shoulder pain and symptoms consistent with sickle cell crisis.  He was treated all day with no relief and subsequently admitted to the hospital.  While in the hospital he continues to have left shoulder pain as well as worsening sickle cell crisis.  He has pain mostly between 8 and 10 out of 10.  Was on Dilaudid PCA Toradol and IV fluids.  Also maintained on long-acting pain medicine from home.  Over time patient has improved.  His hemoglobin dropped to 5.9 and he was given 1 unit of packed red blood cells.  He has chronic pain syndrome.  His left shoulder was stiff.  X-ray of the shoulder showed no bony damage.  Physical therapy was therefore consulted.  Patient given exercises to do in the hospital and at home.  He was subsequently discharged home to follow-up with PCP.  Also to resume his home medications.  Consults: Physical therapy  Significant Diagnostic Studies: labs: Serial CBCs and CMP's.  Hemoglobin was down to 5.9.  Transfused 1 unit of packed red blood cells and it went to 7.1  Treatments: IV hydration, analgesia: acetaminophen and Dilaudid and transfusion of 1 unit packed red blood cells  Discharge Exam: Blood pressure (!) 106/59, pulse 79, temperature 99.2 F (37.3 C), temperature source Axillary, resp. rate 14, SpO2 100 %. General appearance: alert, cooperative and appears stated age Resp: clear to auscultation bilaterally Chest wall: no tenderness Cardio: regular rate and  rhythm, S1, S2 normal, no murmur, click, rub or gallop GI: soft, non-tender; bowel sounds normal; no masses,  no organomegaly Extremities: extremities normal, atraumatic, no cyanosis or edema  Disposition: Discharge disposition: 01-Home or Self Care       Discharge Instructions    Diet - low sodium heart healthy   Complete by:  As directed    Increase activity slowly   Complete by:  As directed      Allergies as of 05/27/2018      Reactions   Buprenorphine Hcl Hives   Levaquin [levofloxacin] Itching   Meperidine Rash   Morphine And Related Hives   Toradol [ketorolac Tromethamine] Itching   Tramadol Hives   Vancomycin Itching   Zosyn [piperacillin Sod-tazobactam So] Itching, Rash   Has taken rocephin in past      Medication List    TAKE these medications   aspirin EC 81 MG tablet Take 1 tablet (81 mg total) by mouth daily.   cholecalciferol 25 MCG (1000 UT) tablet Commonly known as:  VITAMIN D Take 2 tablets (2,000 Units total) by mouth daily.   folic acid 1 MG tablet Commonly known as:  FOLVITE Take 1 tablet (1 mg total) by mouth daily.   gabapentin 300 MG capsule Commonly known as:  NEURONTIN TAKE 1 CAPSULE(300 MG) BY MOUTH THREE TIMES DAILY What changed:  See the new instructions.   hydroxyurea 500 MG capsule Commonly known as:  HYDREA Take 1 capsule (500 mg total) by mouth 2 (two) times daily. May take with food to minimize GI side effects.  ibuprofen 800 MG tablet Commonly known as:  ADVIL,MOTRIN Take 1 tablet (800 mg total) by mouth 3 (three) times daily as needed (pain).   oxyCODONE 20 mg 12 hr tablet Commonly known as:  OXYCONTIN Take 1 tablet (20 mg total) by mouth every 12 (twelve) hours for 15 days.   oxyCODONE-acetaminophen 10-325 MG tablet Commonly known as:  PERCOCET Take 1 tablet by mouth every 6 (six) hours as needed for up to 15 days for pain.        SignedBarbette Merino 05/27/2018, 10:57 AM   Time spent 33 minutes

## 2018-05-27 NOTE — Progress Notes (Signed)
Pt left this afternoon with his "friend".  Discharge instructions given/explained with pt verbalizing understanding.

## 2018-05-28 ENCOUNTER — Telehealth: Payer: Self-pay

## 2018-05-28 DIAGNOSIS — D571 Sickle-cell disease without crisis: Secondary | ICD-10-CM

## 2018-05-28 NOTE — Telephone Encounter (Signed)
Patient called requesting pain medication refill.

## 2018-05-29 MED ORDER — OXYCODONE-ACETAMINOPHEN 10-325 MG PO TABS: 1.0000 | ORAL_TABLET | Freq: Four times a day (QID) | ORAL | 0 refills | Status: DC | PRN

## 2018-05-29 MED ORDER — OXYCODONE HCL ER 20 MG PO T12A: 20.0000 mg | EXTENDED_RELEASE_TABLET | Freq: Two times a day (BID) | ORAL | 0 refills | Status: DC

## 2018-05-30 ENCOUNTER — Emergency Department (HOSPITAL_COMMUNITY)
Admission: EM | Admit: 2018-05-30 | Discharge: 2018-05-30 | Disposition: A | Discharge status: Home / Self Care | Payer: Medicaid Other | Attending: Emergency Medicine | Admitting: Emergency Medicine

## 2018-05-30 ENCOUNTER — Encounter (HOSPITAL_COMMUNITY): Payer: Self-pay | Admitting: Emergency Medicine

## 2018-05-30 ENCOUNTER — Other Ambulatory Visit: Payer: Self-pay

## 2018-05-30 DIAGNOSIS — D5700 Hb-SS disease with crisis, unspecified: Secondary | ICD-10-CM | POA: Diagnosis not present

## 2018-05-30 DIAGNOSIS — Z7982 Long term (current) use of aspirin: Secondary | ICD-10-CM | POA: Diagnosis not present

## 2018-05-30 DIAGNOSIS — Z9049 Acquired absence of other specified parts of digestive tract: Secondary | ICD-10-CM | POA: Insufficient documentation

## 2018-05-30 DIAGNOSIS — G894 Chronic pain syndrome: Secondary | ICD-10-CM | POA: Diagnosis not present

## 2018-05-30 DIAGNOSIS — Z79899 Other long term (current) drug therapy: Secondary | ICD-10-CM | POA: Insufficient documentation

## 2018-05-30 DIAGNOSIS — F329 Major depressive disorder, single episode, unspecified: Secondary | ICD-10-CM | POA: Diagnosis not present

## 2018-05-30 DIAGNOSIS — M7918 Myalgia, other site: Secondary | ICD-10-CM | POA: Diagnosis present

## 2018-05-30 LAB — RETICULOCYTES
Immature Retic Fract: 38.9 % — ABNORMAL HIGH (ref 2.3–15.9)
RBC.: 2.63 MIL/uL — ABNORMAL LOW (ref 4.22–5.81)
Retic Count, Absolute: 446.6 10*3/uL — ABNORMAL HIGH (ref 19.0–186.0)
Retic Ct Pct: 17 % — ABNORMAL HIGH (ref 0.4–3.1)

## 2018-05-30 LAB — COMPREHENSIVE METABOLIC PANEL
ALT: 12 U/L (ref 0–44)
AST: 19 U/L (ref 15–41)
Albumin: 4.2 g/dL (ref 3.5–5.0)
Alkaline Phosphatase: 72 U/L (ref 38–126)
Anion gap: 6 (ref 5–15)
BUN: 6 mg/dL (ref 6–20)
CO2: 25 mmol/L (ref 22–32)
Calcium: 9 mg/dL (ref 8.9–10.3)
Chloride: 107 mmol/L (ref 98–111)
Creatinine, Ser: 0.54 mg/dL — ABNORMAL LOW (ref 0.61–1.24)
GFR calc Af Amer: >60 mL/min (ref >60)
GFR calc non Af Amer: >60 mL/min (ref >60)
Glucose, Bld: 101 mg/dL — ABNORMAL HIGH (ref 70–99)
Potassium: 3.5 mmol/L (ref 3.5–5.1)
Sodium: 138 mmol/L (ref 135–145)
Total Bilirubin: 1.6 mg/dL — ABNORMAL HIGH (ref 0.3–1.2)
Total Protein: 8.2 g/dL — ABNORMAL HIGH (ref 6.5–8.1)

## 2018-05-30 LAB — CBC WITH DIFFERENTIAL/PLATELET
Abs Immature Granulocytes: 0.7 10*3/uL — ABNORMAL HIGH (ref 0.00–0.07)
Basophils Absolute: 0.1 10*3/uL (ref 0.0–0.1)
Basophils Relative: 1 %
Eosinophils Absolute: 0.1 10*3/uL (ref 0.0–0.5)
Eosinophils Relative: 1 %
HCT: 23.8 % — ABNORMAL LOW (ref 39.0–52.0)
Hemoglobin: 8 g/dL — ABNORMAL LOW (ref 13.0–17.0)
Immature Granulocytes: 8 %
Lymphocytes Relative: 18 %
Lymphs Abs: 1.6 10*3/uL (ref 0.7–4.0)
MCH: 30.4 pg (ref 26.0–34.0)
MCHC: 33.6 g/dL (ref 30.0–36.0)
MCV: 90.5 fL (ref 80.0–100.0)
Monocytes Absolute: 0.9 10*3/uL (ref 0.1–1.0)
Monocytes Relative: 10 %
Neutro Abs: 5.8 10*3/uL (ref 1.7–7.7)
Neutrophils Relative %: 62 %
Platelets: 534 10*3/uL — ABNORMAL HIGH (ref 150–400)
RBC: 2.63 MIL/uL — ABNORMAL LOW (ref 4.22–5.81)
RDW: 21.2 % — ABNORMAL HIGH (ref 11.5–15.5)
WBC: 9.2 10*3/uL (ref 4.0–10.5)
nRBC: 15 % — ABNORMAL HIGH (ref 0.0–0.2)

## 2018-05-30 MED ORDER — ONDANSETRON HCL 4 MG/2ML IJ SOLN
4.0000 mg | INTRAMUSCULAR | Status: DC | PRN
  Administered 2018-05-30: 4 mg via INTRAVENOUS
  Filled 2018-05-30: qty 2

## 2018-05-30 MED ORDER — HYDROMORPHONE HCL 2 MG/ML IJ SOLN
2.0000 mg | INTRAMUSCULAR | Status: DC
  Administered 2018-05-30: 2 mg via INTRAVENOUS
  Filled 2018-05-30: qty 1

## 2018-05-30 MED ORDER — HYDROMORPHONE HCL 2 MG/ML IJ SOLN
2.0000 mg | INTRAMUSCULAR | Status: AC
  Administered 2018-05-30: 2 mg via SUBCUTANEOUS

## 2018-05-30 MED ORDER — DIPHENHYDRAMINE HCL 25 MG PO CAPS: 25.0000 mg | ORAL_CAPSULE | ORAL | Status: DC | PRN

## 2018-05-30 MED ORDER — HYDROMORPHONE HCL 2 MG/ML IJ SOLN
2.0000 mg | INTRAMUSCULAR | Status: AC
  Filled 2018-05-30: qty 1

## 2018-05-30 MED ORDER — HYDROMORPHONE HCL 2 MG/ML IJ SOLN
2.0000 mg | INTRAMUSCULAR | Status: AC
  Administered 2018-05-30: 2 mg via INTRAVENOUS
  Filled 2018-05-30: qty 1

## 2018-05-30 MED ORDER — HYDROMORPHONE HCL 2 MG/ML IJ SOLN: 2.0000 mg | INTRAMUSCULAR | Status: DC

## 2018-05-30 MED ORDER — HYDROMORPHONE HCL 2 MG/ML IJ SOLN: 2.0000 mg | INTRAMUSCULAR | Status: AC

## 2018-05-30 MED ORDER — SODIUM CHLORIDE 0.45 % IV SOLN
INTRAVENOUS | Status: DC
  Administered 2018-05-30: 08:00:00 via INTRAVENOUS

## 2018-05-30 NOTE — ED Notes (Signed)
Bed: WA14 Expected date:  Expected time:  Means of arrival:  Comments: [facility] 

## 2018-05-30 NOTE — ED Notes (Signed)
Pt resting in bed at this time, playing on cell phone and speaking to RN in full sentences. No distress noted at this time, IV site unremarkable. Will continue to monitor.

## 2018-05-30 NOTE — ED Provider Notes (Signed)
[facility] DEPT Provider Note   CSN: [id number] Arrival date & time: 05/30/18  0710    History   Chief Complaint Chief Complaint  Patient presents with  . Sickle Cell Pain Crisis    HPI Timothy Marks is a 28 y.o. male.     The history is provided by the patient and medical records. No language interpreter was used.  Sickle Cell Pain Crisis     28 year old male with history of sickle cell disease, chronic pain syndrome, depression brought here via EMS from home for evaluation of sickle cell related pain.  Patient reportedly woke up this morning with severe pain throughout his body.  Pain is sharp dull and achy, 10 out of 10, felt similar to prior sickle cell related pain.  Pain is been persistent.  No associated fever chills no cough shortness of breath or chest pain.  He attributed his pain to him running out of his home pain medication for the past several days.  He did try to contact his doctor but will not get medication refill for the next few days.  He was recently hospitalized for sickle cell crisis discharge 4 days ago.  Past Medical History:  Diagnosis Date  . Depression   . 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 Sickle cell anemia ([facility])   . Vitamin D deficiency y-6    Patient Active Problem List   Diagnosis Date Noted  . Sickle cell anemia with crisis ([place]) 05/22/2018  . Acute bilateral low back pain without sciatica   . Other chronic pain   . Anemia of chronic disease   . 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 Physical deconditioning   . Agitation   . Chronic pain syndrome   . Recurrent cold sores   . Fever   . Pain   . Sickle cell crisis ([place]) 07/16/2017  . Adjustment disorder with mixed disturbance of emotions and conduct   . Constipation 08/03/2014  . 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 Protein-calorie malnutrition, severe ([place]) 04/09/2014  . Major depressive disorder, recurrent,  severe without psychotic features ([place])   . 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 Vitamin D deficiency   . Sickle cell anemia ([place]) 03/30/2014  . Hyperbilirubinemia 03/30/2014  . Hypokalemia 02/07/2014  . Aggressive behavior 01/25/2013  . Sickle cell pain crisis ([place]) 01/24/2013    Past Surgical History:  Procedure Laterality Date  . CHOLECYSTECTOMY          Home Medications    Prior to Admission medications   Medication Sig Start Date End Date Taking? Authorizing Provider  aspirin EC 81 MG tablet Take 1 tablet (81 mg total) by mouth daily. 06/02/17   Dorena Dew, FNP  cholecalciferol (VITAMIN D) 1000 units tablet Take 2 tablets (2,000 Units total) by mouth daily. 12/13/17   Azzie Glatter, FNP  folic acid (FOLVITE) 1 MG tablet Take 1 tablet (1 mg total) by mouth daily. 05/01/17   Dorena Dew, FNP  gabapentin (NEURONTIN) 300 MG capsule TAKE 1 CAPSULE(300 MG) BY MOUTH THREE TIMES DAILY Patient taking differently: Take 300 mg by mouth 3 (three) times daily.  05/08/18   Lanae Boast, FNP  hydroxyurea (HYDREA) 500 MG capsule Take 1 capsule (500 mg total) by mouth 2 (two) times daily. May take with food to minimize GI side effects. 05/01/17   Dorena Dew, FNP  ibuprofen (ADVIL,MOTRIN) 800 MG tablet Take  1 tablet (800 mg total) by mouth 3 (three) times daily as needed (pain). 12/20/17   Azzie Glatter, FNP  oxyCODONE (OXYCONTIN) 20 mg 12 hr tablet Take 1 tablet (20 mg total) by mouth every 12 (twelve) hours for 15 days. 05/29/18 06/13/18  Lanae Boast, FNP  oxyCODONE-acetaminophen (PERCOCET) 10-325 MG tablet Take 1 tablet by mouth every 6 (six) hours as needed for up to 14 days for pain. 06/01/18 06/15/18  Lanae Boast, FNP    Family History Family History  Problem Relation Age of Onset  . Sickle cell trait Mother   . Sickle cell trait Father     Social History Social History   Tobacco Use  . Smoking status: Never Smoker  .  Smokeless tobacco: Never Used  Substance Use Topics  . Alcohol use: No  . Drug use: No    Types: Marijuana    Comment: denies use 05/01/2017     Allergies   Buprenorphine hcl; Levaquin [levofloxacin]; Meperidine; Morphine and related; Toradol [ketorolac tromethamine]; Tramadol; Vancomycin; and Zosyn [piperacillin sod-tazobactam so]   Review of Systems Review of Systems  All other systems reviewed and are negative.    Physical Exam Updated Vital Signs SpO2 99%   Physical Exam Vitals signs and nursing note reviewed.  Constitutional:      General: He is not in acute distress.    Appearance: He is well-developed.     Comments: Appears uncomfortable but nontoxic  HENT:     Head: Atraumatic.  Eyes:     General: Scleral icterus present.     Conjunctiva/sclera: Conjunctivae normal.  Neck:     Musculoskeletal: Neck supple.  Cardiovascular:     Rate and Rhythm: Normal rate and regular rhythm.     Pulses: Normal pulses.     Heart sounds: Normal heart sounds.  Pulmonary:     Effort: Pulmonary effort is normal.     Breath sounds: Normal breath sounds.  Abdominal:     Palpations: Abdomen is soft.     Tenderness: There is no abdominal tenderness.  Musculoskeletal:        General: Tenderness (Diffuse tenderness to palpations of all 4 extremities and major joints without limiting range of motion.) present.  Skin:    Findings: No rash.  Neurological:     Mental Status: He is alert and oriented to person, place, and time.      ED Treatments / Results  Labs (all labs ordered are listed, but only abnormal results are displayed) Labs Reviewed  CBC WITH DIFFERENTIAL/PLATELET - Abnormal; Notable for the following components:      Result Value   RBC 2.63 (*)    Hemoglobin 8.0 (*)    HCT 23.8 (*)    RDW 21.2 (*)    Platelets 534 (*)    nRBC 15.0 (*)    Abs Immature Granulocytes 0.70 (*)    All other components within normal limits  COMPREHENSIVE METABOLIC PANEL - Abnormal;  Notable for the following components:   Glucose, Bld 101 (*)    Creatinine, Ser 0.54 (*)    Total Protein 8.2 (*)    Total Bilirubin 1.6 (*)    All other components within normal limits  RETICULOCYTES - Abnormal; Notable for the following components:   Retic Ct Pct 17.0 (*)    RBC. 2.63 (*)    Retic Count, Absolute 446.6 (*)    Immature Retic Fract 38.9 (*)    All other components within normal limits    EKG None  Radiology No results found.  Procedures .Critical Care Performed by: Domenic Moras, PA-C Authorized by: Domenic Moras, PA-C   Critical care provider statement:    Critical care time (minutes):  45   Critical care was time spent personally by me on the following activities:  Discussions with consultants, evaluation of patient's response to treatment, examination of patient, ordering and performing treatments and interventions, ordering and review of laboratory studies, ordering and review of radiographic studies, pulse oximetry, re-evaluation of patient's condition, obtaining history from patient or surrogate and review of old charts   (including critical care time)  Medications Ordered in ED Medications  0.45 % sodium chloride infusion ( Intravenous Stopped 05/30/18 1044)  HYDROmorphone (DILAUDID) injection 2 mg (2 mg Intravenous Not Given 05/30/18 1041)    Or  HYDROmorphone (DILAUDID) injection 2 mg ( Subcutaneous See Alternative 05/30/18 1041)  diphenhydrAMINE (BENADRYL) capsule 25-50 mg (has no administration in time range)  ondansetron (ZOFRAN) injection 4 mg (4 mg Intravenous Given 05/30/18 0801)  HYDROmorphone (DILAUDID) injection 2 mg (2 mg Intravenous Given 05/30/18 0800)    Or  HYDROmorphone (DILAUDID) injection 2 mg ( Subcutaneous See Alternative 05/30/18 0800)  HYDROmorphone (DILAUDID) injection 2 mg ( Intravenous See Alternative 05/30/18 0800)    Or  HYDROmorphone (DILAUDID) injection 2 mg (2 mg Subcutaneous Given 05/30/18 0800)  HYDROmorphone (DILAUDID)  injection 2 mg (2 mg Intravenous Given 05/30/18 0906)    Or  HYDROmorphone (DILAUDID) injection 2 mg ( Subcutaneous See Alternative 05/30/18 0906)     Initial Impression / Assessment and Plan / ED Course  I have reviewed the triage vital signs and the nursing notes.  Pertinent labs & imaging results that were available during my care of the patient were reviewed by me and considered in my medical decision making (see chart for details).        BP 112/74   Pulse 72   Temp 98 F (36.7 C) (Oral)   Resp 13   Ht 6\' 2"  (1.88 m)   Wt 61.2 kg   SpO2 100%   BMI 17.33 kg/m    Final Clinical Impressions(s) / ED Diagnoses   Final diagnoses:  Chronic pain syndrome  Sickle-cell disease with pain [facility])    ED Discharge Orders    None     7:28 AM Patient here with pain similar to his sickle cell crisis.  Attributed to being out of his home pain medication.  No sickness symptoms to suggest COVID-19 or acute chest.  Work-up initiated, will provide pain management.  10:56 AM Labs are reassuring, patient did report improvement of symptoms after receiving pain management.  He is stable for discharge.  He will follow-up closely with his provider for medication refill.   Domenic Moras, PA-C 05/30/18 1057    Hayden Rasmussen, MD 05/30/18 [phone]

## 2018-05-30 NOTE — ED Triage Notes (Signed)
Pt arrived via [facility] At 0600 woke up with generalized body pain. Pt is out of home medications.

## 2018-06-11 ENCOUNTER — Telehealth: Payer: Self-pay

## 2018-06-12 NOTE — Telephone Encounter (Signed)
Message sent to provider 

## 2018-06-13 ENCOUNTER — Other Ambulatory Visit (HOSPITAL_COMMUNITY): Payer: Self-pay | Admitting: Family Medicine

## 2018-06-13 DIAGNOSIS — D571 Sickle-cell disease without crisis: Secondary | ICD-10-CM

## 2018-06-13 MED ORDER — OXYCODONE HCL ER 20 MG PO T12A: 20.0000 mg | EXTENDED_RELEASE_TABLET | Freq: Two times a day (BID) | ORAL | 0 refills | Status: DC

## 2018-06-13 MED ORDER — OXYCODONE-ACETAMINOPHEN 10-325 MG PO TABS: 1.0000 | ORAL_TABLET | Freq: Four times a day (QID) | ORAL | 0 refills | Status: DC | PRN

## 2018-06-14 ENCOUNTER — Ambulatory Visit: Payer: Self-pay | Admitting: Family Medicine

## 2018-06-20 ENCOUNTER — Ambulatory Visit: Payer: Self-pay | Admitting: Family Medicine

## 2018-06-21 NOTE — Telephone Encounter (Signed)
Message sent to provider 

## 2018-06-28 ENCOUNTER — Other Ambulatory Visit: Payer: Self-pay

## 2018-06-28 ENCOUNTER — Ambulatory Visit (INDEPENDENT_AMBULATORY_CARE_PROVIDER_SITE_OTHER): Payer: Medicaid Other | Admitting: Family Medicine

## 2018-06-28 ENCOUNTER — Encounter: Payer: Self-pay | Admitting: Family Medicine

## 2018-06-28 VITALS — BP 119/70 | HR 80 | Temp 97.8°F | Resp 16 | Ht 74.0 in | Wt 123.0 lb

## 2018-06-28 DIAGNOSIS — D571 Sickle-cell disease without crisis: Secondary | ICD-10-CM

## 2018-06-28 LAB — POCT URINALYSIS DIPSTICK
Bilirubin, UA: NEGATIVE
Glucose, UA: NEGATIVE
Ketones, UA: NEGATIVE
Leukocytes, UA: NEGATIVE
Nitrite, UA: NEGATIVE
Protein, UA: POSITIVE — AB
Spec Grav, UA: 1.015 (ref 1.010–1.025)
Urobilinogen, UA: 1 E.U./dL
pH, UA: 6.5 (ref 5.0–8.0)

## 2018-06-28 MED ORDER — OXYCODONE-ACETAMINOPHEN 10-325 MG PO TABS: 1.0000 | ORAL_TABLET | Freq: Four times a day (QID) | ORAL | 0 refills | Status: DC | PRN

## 2018-06-28 MED ORDER — OXYCODONE HCL ER 20 MG PO T12A: 20.0000 mg | EXTENDED_RELEASE_TABLET | Freq: Two times a day (BID) | ORAL | 0 refills | Status: DC

## 2018-06-28 NOTE — Patient Instructions (Signed)
Sickle Cell Anemia, Adult °Sickle cell anemia is a condition where your red blood cells are shaped like sickles. Red blood cells carry oxygen through the body. Sickle-shaped cells do not live as long as normal red blood cells. They also clump together and block blood from flowing through the blood vessels. This prevents the body from getting enough oxygen. Sickle cell anemia causes organ damage and pain. It also increases the risk of infection. °Follow these instructions at home: °Medicines °· Take over-the-counter and prescription medicines only as told by your doctor. °· If you were prescribed an antibiotic medicine, take it as told by your doctor. Do not stop taking the antibiotic even if you start to feel better. °· If you develop a fever, do not take medicines to lower the fever right away. Tell your doctor about the fever. °Managing pain, stiffness, and swelling °· Try these methods to help with pain: °? Use a heating pad. °? Take a warm bath. °? Distract yourself, such as by watching TV. °Eating and drinking °· Drink enough fluid to keep your pee (urine) clear or pale yellow. Drink more in hot weather and during exercise. °· Limit or avoid alcohol. °· Eat a healthy diet. Eat plenty of fruits, vegetables, whole grains, and lean protein. °· Take vitamins and supplements as told by your doctor. °Traveling °· When traveling, keep these with you: °? Your medical information. °? The names of your doctors. °? Your medicines. °· If you need to take an airplane, talk to your doctor first. °Activity °· Rest often. °· Avoid exercises that make your heart beat much faster, such as jogging. °General instructions °· Do not use products that have nicotine or tobacco, such as cigarettes and e-cigarettes. If you need help quitting, ask your doctor. °· Consider wearing a medical alert bracelet. °· Avoid being in high places (high altitudes), such as mountains. °· Avoid very hot or cold temperatures. °· Avoid places where the  temperature changes a lot. °· Keep all follow-up visits as told by your doctor. This is important. °Contact a doctor if: °· A joint hurts. °· Your feet or hands hurt or swell. °· You feel tired (fatigued). °Get help right away if: °· You have symptoms of infection. These include: °? Fever. °? Chills. °? Being very tired. °? Irritability. °? Poor eating. °? Throwing up (vomiting). °· You feel dizzy or faint. °· You have new stomach pain, especially on the left side. °· You have a an erection (priapism) that lasts more than 4 hours. °· You have numbness in your arms or legs. °· You have a hard time moving your arms or legs. °· You have trouble talking. °· You have pain that does not go away when you take medicine. °· You are short of breath. °· You are breathing fast. °· You have a long-term cough. °· You have pain in your chest. °· You have a bad headache. °· You have a stiff neck. °· Your stomach looks bloated even though you did not eat much. °· Your skin is pale. °· You suddenly cannot see well. °Summary °· Sickle cell anemia is a condition where your red blood cells are shaped like sickles. °· Follow your doctor's advice on ways to manage pain, food to eat, activities to do, and steps to take for safe travel. °· Get medical help right away if you have any signs of infection, such as a fever. °This information is not intended to replace advice given to you by your   health care provider. Make sure you discuss any questions you have with your health care provider. °Document Released: 11/21/2012 Document Revised: 03/08/2016 Document Reviewed: 03/08/2016 °Elsevier Interactive Patient Education © 2019 Elsevier Inc. ° °

## 2018-06-28 NOTE — Progress Notes (Signed)
PATIENT CARE CENTER INTERNAL MEDICINE AND SICKLE CELL CARE  SICKLE CELL ANEMIA FOLLOW UP VISIT PROVIDER: Lanae Boast, FNP    Subjective:   Timothy Marks  is a 28 y.o.  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 presents for a follow up for Sickle Cell Anemia. The patient has had 5 hospital visits in the past 6 months.  Pain regimen includes: Ibuprofen, oxycontin and oxycodone Hydrea Therapy: Yes Medication compliance: Yes  Pain today is 7/10 The patient reports adequate daily hydration.      Review of Systems  Constitutional: Negative.   HENT: Negative.   Eyes: Negative.   Respiratory: Negative.   Cardiovascular: Negative.   Gastrointestinal: Negative.   Genitourinary: Negative.   Musculoskeletal: Positive for myalgias.  Skin: Negative.   Neurological: Negative.   Psychiatric/Behavioral: Negative.     Objective:   Objective  BP 119/70 (BP Location: Left Arm, Patient Position: Sitting, Cuff Size: Normal)   Pulse 80   Temp 97.8 F (36.6 C) (Oral)   Resp 16   Ht 6\' 2"  (1.88 m)   Wt 123 lb (55.8 kg)   SpO2 100%   BMI 15.79 kg/m   Wt Readings from Last 3 Encounters:  06/28/18 123 lb (55.8 kg)  05/30/18 135 lb (61.2 kg)  05/22/18 128 lb (58.1 kg)     Physical Exam Vitals signs and nursing note reviewed.  Constitutional:      General: He is not in acute distress.    Appearance: He is well-developed.  HENT:     Head: Normocephalic and atraumatic.  Eyes:     Conjunctiva/sclera: Conjunctivae normal.     Pupils: Pupils are equal, round, and reactive to light.  Neck:     Musculoskeletal: Normal range of motion.  Cardiovascular:     Rate and Rhythm: Normal rate and regular rhythm.     Heart sounds: Murmur present.  Pulmonary:     Effort: Pulmonary effort is normal. No respiratory distress.     Breath sounds: Normal  breath sounds.  Abdominal:     General: Bowel sounds are normal. There is no distension.     Palpations: Abdomen is soft.  Musculoskeletal: Normal range of motion.  Skin:    General: Skin is warm and dry.  Neurological:     Mental Status: He is alert and oriented to person, place, and time.  Psychiatric:        Mood and Affect: Mood normal.        Behavior: Behavior normal.        Thought Content: Thought content normal.        Judgment: Judgment normal.      Assessment/Plan:   Assessment   Encounter Diagnoses  Name Primary?  . Vitamin D deficiency Yes  . Hb-SS disease without crisis ([place])      Plan  1. Hb-SS disease without crisis ([place]) No medication changes warranted at the present time. Refills sent to the pharmacy.  - Urinalysis Dipstick - oxyCODONE (OXYCONTIN) 20 mg 12 hr tablet; Take 1 tablet (20 mg total) by mouth every 12 (twelve) hours for 30 days.  Dispense: 60 tablet; Refill: 0 - oxyCODONE-acetaminophen (PERCOCET) 10-325 MG tablet; Take 1 tablet by mouth every 6 (six) hours as needed for up to 15 days for pain.  Dispense: 60 tablet; Refill: 0 - Drug Screen 8 w/Conf, Ur - CBC with Differential -  Comprehensive metabolic panel    Return to care as scheduled and prn. Patient verbalized understanding and agreed with plan of care.   1. Sickle cell disease -  We discussed the need for good hydration, monitoring of hydration status, avoidance of heat, cold, stress, and infection triggers. We discussed the risks and benefits of Hydrea, including bone marrow suppression, the possibility of GI upset, skin ulcers, hair thinning, and teratogenicity. The patient was reminded of the need to seek medical attention of any symptoms of bleeding, anemia, or infection. Continue folic acid 1 mg daily to prevent aplastic bone marrow crises.   2. Pulmonary evaluation - Patient denies severe recurrent wheezes, shortness of breath with exercise, or persistent cough. If these symptoms  develop, pulmonary function tests with spirometry will be ordered, and if abnormal, plan on referral to Pulmonology for further evaluation.  3. Cardiac - Routine screening for pulmonary hypertension is not recommended.  4. Eye - High risk of proliferative retinopathy. Annual eye exam with retinal exam recommended to patient.  5. Immunization status -  Yearly influenza vaccination is recommended, as well as being up to date with Meningococcal and Pneumococcal vaccines.   6. Acute and chronic painful episodes - We discussed that pt is to receive Schedule II prescriptions only from [place]. Pt is also aware that the prescription history is available to [place] online through the [facility] CSRS. Controlled substance agreement signed. We reminded Timothy Marks that all patients receiving Schedule II narcotics must be seen for follow within one month of prescription being requested. We reviewed the terms of our pain agreement, including the need to keep medicines in a safe locked location away from children or pets, and the need to report excess sedation or constipation, measures to avoid constipation, and policies related to early refills and stolen prescriptions. According to the [place] [facility] program, we have reviewed details related to analgesia, adverse effects, aberrant behaviors.  7. Iron overload from chronic transfusion.  Not applicable at this time.  If this occurs will use Exjade for management.   8. Vitamin D deficiency - Drisdol 50,000 units weekly. Patient encouraged to take as prescribed.   The above recommendations are taken from the NIH Evidence-Based Management of Sickle Cell Disease: Expert Panel Report, 20149.   [name], FNP-BC [facility] [address] [place], [place] [address] [phone]  This note has been created with Dragon speech recognition software and smart phrase technology. Any transcriptional errors are unintentional.

## 2018-06-29 LAB — CBC WITH DIFFERENTIAL/PLATELET
Basophils Absolute: 0 10*3/uL (ref 0.0–0.2)
Basos: 1 %
EOS (ABSOLUTE): 0.1 10*3/uL (ref 0.0–0.4)
Eos: 2 %
Hematocrit: 39.7 % (ref 37.5–51.0)
Hemoglobin: 13 g/dL (ref 13.0–17.7)
Immature Grans (Abs): 0 10*3/uL (ref 0.0–0.1)
Immature Granulocytes: 0 %
Lymphocytes Absolute: 2.2 10*3/uL (ref 0.7–3.1)
Lymphs: 30 %
MCH: 27.1 pg (ref 26.6–33.0)
MCHC: 32.7 g/dL (ref 31.5–35.7)
MCV: 83 fL (ref 79–97)
Monocytes Absolute: 0.6 10*3/uL (ref 0.1–0.9)
Monocytes: 8 %
Neutrophils Absolute: 4.3 10*3/uL (ref 1.4–7.0)
Neutrophils: 59 %
Platelets: 350 10*3/uL (ref 150–450)
RBC: 4.8 x10E6/uL (ref 4.14–5.80)
RDW: 17.1 % — ABNORMAL HIGH (ref 11.6–15.4)
WBC: 7.4 10*3/uL (ref 3.4–10.8)

## 2018-06-29 LAB — COMPREHENSIVE METABOLIC PANEL
ALT: 8 IU/L (ref 0–44)
AST: 22 IU/L (ref 0–40)
Albumin/Globulin Ratio: 1.4 (ref 1.2–2.2)
Albumin: 4.6 g/dL (ref 4.1–5.2)
Alkaline Phosphatase: 97 IU/L (ref 39–117)
BUN/Creatinine Ratio: 7 — ABNORMAL LOW (ref 9–20)
BUN: 5 mg/dL — ABNORMAL LOW (ref 6–20)
Bilirubin Total: 1.6 mg/dL — ABNORMAL HIGH (ref 0.0–1.2)
CO2: 21 mmol/L (ref 20–29)
Calcium: 9.5 mg/dL (ref 8.7–10.2)
Chloride: 101 mmol/L (ref 96–106)
Creatinine, Ser: 0.75 mg/dL — ABNORMAL LOW (ref 0.76–1.27)
GFR calc Af Amer: 143 mL/min/{1.73_m2} (ref >59)
GFR calc non Af Amer: 124 mL/min/{1.73_m2} (ref >59)
Globulin, Total: 3.3 g/dL (ref 1.5–4.5)
Glucose: 92 mg/dL (ref 65–99)
Potassium: 3.8 mmol/L (ref 3.5–5.2)
Sodium: 138 mmol/L (ref 134–144)
Total Protein: 7.9 g/dL (ref 6.0–8.5)

## 2018-07-03 LAB — CANNABINOID CONFIRMATION, UR
CANNABINOIDS: POSITIVE — AB
Carboxy THC GC/MS Conf: >400 ng/mL

## 2018-07-03 LAB — DRUG SCREEN 8 W/CONF, UR
Amphetamines, Urine: NEGATIVE ng/mL
BENZODIAZ UR QL: NEGATIVE ng/mL
Barbiturate screen, urine: NEGATIVE ng/mL
Buprenorphine, Urine: NEGATIVE ng/mL
Cocaine (Metab.): NEGATIVE ng/mL
Methadone Screen, Urine: NEGATIVE ng/mL
OPIATE SCREEN URINE: NEGATIVE ng/mL

## 2018-07-10 ENCOUNTER — Telehealth: Payer: Self-pay

## 2018-07-10 DIAGNOSIS — D571 Sickle-cell disease without crisis: Secondary | ICD-10-CM

## 2018-07-10 MED ORDER — OXYCODONE-ACETAMINOPHEN 10-325 MG PO TABS: 1.0000 | ORAL_TABLET | Freq: Four times a day (QID) | ORAL | 0 refills | Status: DC | PRN

## 2018-07-10 NOTE — Telephone Encounter (Signed)
refilled 

## 2018-07-15 ENCOUNTER — Other Ambulatory Visit: Payer: Self-pay

## 2018-07-15 ENCOUNTER — Emergency Department (HOSPITAL_COMMUNITY)
Admission: EM | Admit: 2018-07-15 | Discharge: 2018-07-15 | Disposition: A | Discharge status: Home / Self Care | Payer: Medicaid Other | Attending: Emergency Medicine | Admitting: Emergency Medicine

## 2018-07-15 ENCOUNTER — Encounter (HOSPITAL_COMMUNITY): Payer: Self-pay

## 2018-07-15 DIAGNOSIS — Z79899 Other long term (current) drug therapy: Secondary | ICD-10-CM | POA: Diagnosis not present

## 2018-07-15 DIAGNOSIS — Z7982 Long term (current) use of aspirin: Secondary | ICD-10-CM | POA: Diagnosis not present

## 2018-07-15 DIAGNOSIS — D5700 Hb-SS disease with crisis, unspecified: Secondary | ICD-10-CM

## 2018-07-15 DIAGNOSIS — D57419 Sickle-cell thalassemia with crisis, unspecified: Secondary | ICD-10-CM | POA: Insufficient documentation

## 2018-07-15 LAB — COMPREHENSIVE METABOLIC PANEL
ALT: 12 U/L (ref 0–44)
AST: 23 U/L (ref 15–41)
Albumin: 4.7 g/dL (ref 3.5–5.0)
Alkaline Phosphatase: 83 U/L (ref 38–126)
Anion gap: 8 (ref 5–15)
BUN: 6 mg/dL (ref 6–20)
CO2: 25 mmol/L (ref 22–32)
Calcium: 9.2 mg/dL (ref 8.9–10.3)
Chloride: 105 mmol/L (ref 98–111)
Creatinine, Ser: 0.64 mg/dL (ref 0.61–1.24)
GFR calc Af Amer: >60 mL/min (ref >60)
GFR calc non Af Amer: >60 mL/min (ref >60)
Glucose, Bld: 103 mg/dL — ABNORMAL HIGH (ref 70–99)
Potassium: 3.7 mmol/L (ref 3.5–5.1)
Sodium: 138 mmol/L (ref 135–145)
Total Bilirubin: 2.3 mg/dL — ABNORMAL HIGH (ref 0.3–1.2)
Total Protein: 8.3 g/dL — ABNORMAL HIGH (ref 6.5–8.1)

## 2018-07-15 LAB — CBC WITH DIFFERENTIAL/PLATELET
Abs Immature Granulocytes: 0.06 10*3/uL (ref 0.00–0.07)
Basophils Absolute: 0 10*3/uL (ref 0.0–0.1)
Basophils Relative: 0 %
Eosinophils Absolute: 0.4 10*3/uL (ref 0.0–0.5)
Eosinophils Relative: 2 %
HCT: 29.5 % — ABNORMAL LOW (ref 39.0–52.0)
Hemoglobin: 9.8 g/dL — ABNORMAL LOW (ref 13.0–17.0)
Immature Granulocytes: 0 %
Lymphocytes Relative: 13 %
Lymphs Abs: 2 10*3/uL (ref 0.7–4.0)
MCH: 27.1 pg (ref 26.0–34.0)
MCHC: 33.2 g/dL (ref 30.0–36.0)
MCV: 81.7 fL (ref 80.0–100.0)
Monocytes Absolute: 1.2 10*3/uL — ABNORMAL HIGH (ref 0.1–1.0)
Monocytes Relative: 8 %
Neutro Abs: 12.2 10*3/uL — ABNORMAL HIGH (ref 1.7–7.7)
Neutrophils Relative %: 77 %
Platelets: 473 10*3/uL — ABNORMAL HIGH (ref 150–400)
RBC: 3.61 MIL/uL — ABNORMAL LOW (ref 4.22–5.81)
RDW: 23.2 % — ABNORMAL HIGH (ref 11.5–15.5)
WBC: 15.8 10*3/uL — ABNORMAL HIGH (ref 4.0–10.5)
nRBC: 1 % — ABNORMAL HIGH (ref 0.0–0.2)

## 2018-07-15 LAB — RETICULOCYTES
Immature Retic Fract: 39.5 % — ABNORMAL HIGH (ref 2.3–15.9)
RBC.: 3.61 MIL/uL — ABNORMAL LOW (ref 4.22–5.81)
Retic Count, Absolute: 283.4 10*3/uL — ABNORMAL HIGH (ref 19.0–186.0)
Retic Ct Pct: 7.9 % — ABNORMAL HIGH (ref 0.4–3.1)

## 2018-07-15 MED ORDER — SODIUM CHLORIDE 0.9 % IV BOLUS
1000.0000 mL | Freq: Once | INTRAVENOUS | Status: AC
  Administered 2018-07-15: 1000 mL via INTRAVENOUS

## 2018-07-15 MED ORDER — SODIUM CHLORIDE 0.9% FLUSH: 3.0000 mL | Freq: Once | INTRAVENOUS | Status: DC

## 2018-07-15 MED ORDER — DIPHENHYDRAMINE HCL 50 MG/ML IJ SOLN
25.0000 mg | Freq: Once | INTRAMUSCULAR | Status: AC
  Administered 2018-07-15: 25 mg via INTRAVENOUS
  Filled 2018-07-15: qty 1

## 2018-07-15 MED ORDER — HYDROMORPHONE HCL 2 MG/ML IJ SOLN
2.0000 mg | Freq: Once | INTRAMUSCULAR | Status: AC
  Administered 2018-07-15: 2 mg via INTRAVENOUS
  Filled 2018-07-15: qty 1

## 2018-07-15 NOTE — Discharge Instructions (Addendum)
Continue home medications as previously prescribed.  Return to the emergency department if you develop severe chest pain, high fever, difficulty breathing, or other new and concerning symptoms.

## 2018-07-15 NOTE — ED Provider Notes (Signed)
[facility] DEPT Provider Note   CSN: [id number] Arrival date & time: 07/15/18  4098    History   Chief Complaint Chief Complaint  Patient presents with  . Sickle Cell Pain Crisis    HPI Timothy Marks is a 28 y.o. male.     Patient is a 28 year old male with past medical history of sickle cell disease presenting with complaints of pain in his shoulder and back that is consistent with his prior sickle cell crises.  Pain has been unrelieved with home medications.  He denies any fevers, chills, cough, or chest pain.  He denies any difficulty breathing.  The history is provided by the patient.  Sickle Cell Pain Crisis  Pain location: left shoulder and back. Severity:  Severe Onset quality:  Gradual Duration:  3 days Timing:  Constant Progression:  Worsening Chronicity:  Recurrent Relieved by:  Nothing Worsened by:  Nothing Ineffective treatments:  Prescription drugs   Past Medical History:  Diagnosis Date  . Depression   . 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 Sickle cell anemia ([facility])   . Vitamin D deficiency y-6    Patient Active Problem List   Diagnosis Date Noted  . Sickle cell anemia with crisis ([place]) 05/22/2018  . Acute bilateral low back pain without sciatica   . Other chronic pain   . Anemia of chronic disease   . 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 Physical deconditioning   . Agitation   . Chronic pain syndrome   . Recurrent cold sores   . Fever   . Pain   . Sickle cell crisis ([place]) 07/16/2017  . Adjustment disorder with mixed disturbance of emotions and conduct   . Constipation 08/03/2014  . 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 Protein-calorie malnutrition, severe ([place]) 04/09/2014  . Major depressive disorder, recurrent, severe without psychotic features ([place])   . 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 Vitamin D deficiency   . Sickle cell  anemia ([place]) 03/30/2014  . Hyperbilirubinemia 03/30/2014  . Hypokalemia 02/07/2014  . Aggressive behavior 01/25/2013  . Sickle cell pain crisis ([place]) 01/24/2013    Past Surgical History:  Procedure Laterality Date  . CHOLECYSTECTOMY          Home Medications    Prior to Admission medications   Medication Sig Start Date End Date Taking? Authorizing Provider  aspirin EC 81 MG tablet Take 1 tablet (81 mg total) by mouth daily. 06/02/17   Dorena Dew, FNP  cholecalciferol (VITAMIN D) 1000 units tablet Take 2 tablets (2,000 Units total) by mouth daily. 12/13/17   Azzie Glatter, FNP  folic acid (FOLVITE) 1 MG tablet Take 1 tablet (1 mg total) by mouth daily. 05/01/17   Dorena Dew, FNP  gabapentin (NEURONTIN) 300 MG capsule TAKE 1 CAPSULE(300 MG) BY MOUTH THREE TIMES DAILY Patient taking differently: Take 300 mg by mouth 3 (three) times daily.  05/08/18   Lanae Boast, FNP  hydroxyurea (HYDREA) 500 MG capsule Take 1 capsule (500 mg total) by mouth 2 (two) times daily. May take with food to minimize GI side effects. 05/01/17   Dorena Dew, FNP  ibuprofen (ADVIL,MOTRIN) 800 MG tablet Take 1 tablet (800 mg total) by mouth 3 (three) times daily as needed (pain). 12/20/17   Azzie Glatter, FNP  oxyCODONE (OXYCONTIN) 20 mg 12 hr tablet Take 1 tablet (20 mg total) by mouth every 12 (twelve)  hours for 30 days. 06/28/18 07/28/18  Lanae Boast, FNP  oxyCODONE-acetaminophen (PERCOCET) 10-325 MG tablet Take 1 tablet by mouth every 6 (six) hours as needed for up to 15 days for pain. 07/13/18 07/28/18  Lanae Boast, FNP    Family History Family History  Problem Relation Age of Onset  . Sickle cell trait Mother   . Sickle cell trait Father     Social History Social History   Tobacco Use  . Smoking status: Never Smoker  . Smokeless tobacco: Never Used  Substance Use Topics  . Alcohol use: No  . Drug use: Not Currently    Types: Marijuana     Allergies    Buprenorphine hcl; Levaquin [levofloxacin]; Meperidine; Morphine and related; Toradol [ketorolac tromethamine]; Tramadol; Vancomycin; and Zosyn [piperacillin sod-tazobactam so]   Review of Systems Review of Systems  All other systems reviewed and are negative.    Physical Exam Updated Vital Signs BP 131/74 (BP Location: Right Arm)   Pulse 79   Temp 98.6 F (37 C) (Oral)   Resp 16   Ht 6\' 2"  (1.88 m)   Wt 61.2 kg   SpO2 97%   BMI 17.33 kg/m   Physical Exam Vitals signs and nursing note reviewed.  Constitutional:      General: He is not in acute distress.    Appearance: He is well-developed. He is not diaphoretic.  HENT:     Head: Normocephalic and atraumatic.  Neck:     Musculoskeletal: Normal range of motion and neck supple.  Cardiovascular:     Rate and Rhythm: Normal rate and regular rhythm.     Heart sounds: No murmur. No friction rub.  Pulmonary:     Effort: Pulmonary effort is normal. No respiratory distress.     Breath sounds: Normal breath sounds. No wheezing or rales.  Abdominal:     General: Bowel sounds are normal. There is no distension.     Palpations: Abdomen is soft.     Tenderness: There is no abdominal tenderness.  Musculoskeletal: Normal range of motion.  Skin:    General: Skin is warm and dry.  Neurological:     Mental Status: He is alert and oriented to person, place, and time.     Coordination: Coordination normal.      ED Treatments / Results  Labs (all labs ordered are listed, but only abnormal results are displayed) Labs Reviewed  COMPREHENSIVE METABOLIC PANEL  CBC WITH DIFFERENTIAL/PLATELET  RETICULOCYTES    EKG None  Radiology No results found.  Procedures Procedures (including critical care time)  Medications Ordered in ED Medications  sodium chloride flush (NS) 0.9 % injection 3 mL ( Intravenous Canceled Entry 07/15/18 0918)  HYDROmorphone (DILAUDID) injection 2 mg (has no administration in time range)  sodium  chloride 0.9 % bolus 1,000 mL (has no administration in time range)  diphenhydrAMINE (BENADRYL) injection 25 mg (has no administration in time range)     Initial Impression / Assessment and Plan / ED Course  I have reviewed the triage vital signs and the nursing notes.  Pertinent labs & imaging results that were available during my care of the patient were reviewed by me and considered in my medical decision making (see chart for details).  Patient with history of sickle cell disease presenting with complaints of pain in his shoulder consistent with prior sickle cell crises.  His laboratory studies are consistent with his baseline.  Patient is feeling better after IV fluids and medications given in the  ER.  Patient requesting discharge.  To return as needed for any problems.  Patient not experiencing any chest pain or difficulty breathing.  He has had no fevers.  Nothing to suggest acute chest.  Final Clinical Impressions(s) / ED Diagnoses   Final diagnoses:  None    ED Discharge Orders    None       Veryl Speak, MD 07/15/18 1213

## 2018-07-15 NOTE — ED Triage Notes (Signed)
Patient c/o sickle cell pain of the right shoulder and bilateral lower back x 2 days. Patient denies any SOB or chest pain.

## 2018-07-20 ENCOUNTER — Encounter (HOSPITAL_COMMUNITY): Payer: Self-pay | Admitting: General Practice

## 2018-07-20 ENCOUNTER — Telehealth (HOSPITAL_COMMUNITY): Payer: Self-pay | Admitting: *Deleted

## 2018-07-20 ENCOUNTER — Non-Acute Institutional Stay (HOSPITAL_COMMUNITY)
Admission: AD | Admit: 2018-07-20 | Discharge: 2018-07-20 | Disposition: A | Discharge status: Home / Self Care | Payer: Medicaid Other | Source: Ambulatory Visit | Attending: Internal Medicine | Admitting: Internal Medicine

## 2018-07-20 DIAGNOSIS — Z791 Long term (current) use of non-steroidal anti-inflammatories (NSAID): Secondary | ICD-10-CM | POA: Diagnosis not present

## 2018-07-20 DIAGNOSIS — Z79899 Other long term (current) drug therapy: Secondary | ICD-10-CM | POA: Diagnosis not present

## 2018-07-20 DIAGNOSIS — D5700 Hb-SS disease with crisis, unspecified: Secondary | ICD-10-CM | POA: Diagnosis not present

## 2018-07-20 DIAGNOSIS — E559 Vitamin D deficiency, unspecified: Secondary | ICD-10-CM | POA: Insufficient documentation

## 2018-07-20 DIAGNOSIS — Z7982 Long term (current) use of aspirin: Secondary | ICD-10-CM | POA: Diagnosis not present

## 2018-07-20 DIAGNOSIS — G894 Chronic pain syndrome: Secondary | ICD-10-CM | POA: Diagnosis not present

## 2018-07-20 DIAGNOSIS — F1120 Opioid dependence, uncomplicated: Secondary | ICD-10-CM | POA: Diagnosis not present

## 2018-07-20 DIAGNOSIS — F329 Major depressive disorder, single episode, unspecified: Secondary | ICD-10-CM | POA: Insufficient documentation

## 2018-07-20 DIAGNOSIS — F4325 Adjustment disorder with mixed disturbance of emotions and conduct: Secondary | ICD-10-CM | POA: Insufficient documentation

## 2018-07-20 LAB — CBC WITH DIFFERENTIAL/PLATELET
Abs Immature Granulocytes: 0.02 10*3/uL (ref 0.00–0.07)
Basophils Absolute: 0 10*3/uL (ref 0.0–0.1)
Basophils Relative: 0 %
Eosinophils Absolute: 0.2 10*3/uL (ref 0.0–0.5)
Eosinophils Relative: 3 %
HCT: 27.9 % — ABNORMAL LOW (ref 39.0–52.0)
Hemoglobin: 9.3 g/dL — ABNORMAL LOW (ref 13.0–17.0)
Immature Granulocytes: 0 %
Lymphocytes Relative: 37 %
Lymphs Abs: 2.9 10*3/uL (ref 0.7–4.0)
MCH: 27.8 pg (ref 26.0–34.0)
MCHC: 33.3 g/dL (ref 30.0–36.0)
MCV: 83.3 fL (ref 80.0–100.0)
Monocytes Absolute: 0.5 10*3/uL (ref 0.1–1.0)
Monocytes Relative: 7 %
Neutro Abs: 4.1 10*3/uL (ref 1.7–7.7)
Neutrophils Relative %: 53 %
Platelets: 498 10*3/uL — ABNORMAL HIGH (ref 150–400)
RBC: 3.35 MIL/uL — ABNORMAL LOW (ref 4.22–5.81)
RDW: 23.1 % — ABNORMAL HIGH (ref 11.5–15.5)
WBC: 7.8 10*3/uL (ref 4.0–10.5)
nRBC: 1.7 % — ABNORMAL HIGH (ref 0.0–0.2)

## 2018-07-20 LAB — COMPREHENSIVE METABOLIC PANEL
ALT: 8 U/L (ref 0–44)
AST: 20 U/L (ref 15–41)
Albumin: 4.5 g/dL (ref 3.5–5.0)
Alkaline Phosphatase: 70 U/L (ref 38–126)
Anion gap: 6 (ref 5–15)
BUN: 6 mg/dL (ref 6–20)
CO2: 26 mmol/L (ref 22–32)
Calcium: 9 mg/dL (ref 8.9–10.3)
Chloride: 107 mmol/L (ref 98–111)
Creatinine, Ser: 0.6 mg/dL — ABNORMAL LOW (ref 0.61–1.24)
GFR calc Af Amer: >60 mL/min (ref >60)
GFR calc non Af Amer: >60 mL/min (ref >60)
Glucose, Bld: 89 mg/dL (ref 70–99)
Potassium: 3.3 mmol/L — ABNORMAL LOW (ref 3.5–5.1)
Sodium: 139 mmol/L (ref 135–145)
Total Bilirubin: 2.4 mg/dL — ABNORMAL HIGH (ref 0.3–1.2)
Total Protein: 8.4 g/dL — ABNORMAL HIGH (ref 6.5–8.1)

## 2018-07-20 LAB — RETICULOCYTES
Immature Retic Fract: 42.9 % — ABNORMAL HIGH (ref 2.3–15.9)
RBC.: 3.35 MIL/uL — ABNORMAL LOW (ref 4.22–5.81)
Retic Count, Absolute: 290.8 10*3/uL — ABNORMAL HIGH (ref 19.0–186.0)
Retic Ct Pct: 8.7 % — ABNORMAL HIGH (ref 0.4–3.1)

## 2018-07-20 MED ORDER — SODIUM CHLORIDE 0.45 % IV SOLN
INTRAVENOUS | Status: DC
  Administered 2018-07-20: 13:00:00 via INTRAVENOUS

## 2018-07-20 MED ORDER — DIPHENHYDRAMINE HCL 25 MG PO CAPS: 25.0000 mg | ORAL_CAPSULE | ORAL | Status: DC | PRN

## 2018-07-20 MED ORDER — SODIUM CHLORIDE 0.9% FLUSH: 9.0000 mL | INTRAVENOUS | Status: DC | PRN

## 2018-07-20 MED ORDER — ONDANSETRON HCL 4 MG/2ML IJ SOLN
4.0000 mg | Freq: Four times a day (QID) | INTRAMUSCULAR | Status: DC | PRN
  Administered 2018-07-20: 4 mg via INTRAVENOUS
  Filled 2018-07-20: qty 2

## 2018-07-20 MED ORDER — ACETAMINOPHEN 500 MG PO TABS
1000.0000 mg | ORAL_TABLET | Freq: Once | ORAL | Status: AC
  Administered 2018-07-20: 1000 mg via ORAL
  Filled 2018-07-20: qty 2

## 2018-07-20 MED ORDER — HYDROMORPHONE 1 MG/ML IV SOLN
INTRAVENOUS | Status: DC
  Administered 2018-07-20: 9.5 mg via INTRAVENOUS
  Administered 2018-07-20: 30 mg via INTRAVENOUS
  Filled 2018-07-20: qty 30

## 2018-07-20 MED ORDER — KETOROLAC TROMETHAMINE 30 MG/ML IJ SOLN
15.0000 mg | Freq: Once | INTRAMUSCULAR | Status: AC
  Administered 2018-07-20: 15 mg via INTRAVENOUS
  Filled 2018-07-20: qty 1

## 2018-07-20 MED ORDER — NALOXONE HCL 0.4 MG/ML IJ SOLN: 0.4000 mg | INTRAMUSCULAR | Status: DC | PRN

## 2018-07-20 NOTE — Discharge Instructions (Signed)
Sickle Cell Anemia, Adult °Sickle cell anemia is a condition where your red blood cells are shaped like sickles. Red blood cells carry oxygen through the body. Sickle-shaped cells do not live as long as normal red blood cells. They also clump together and block blood from flowing through the blood vessels. This prevents the body from getting enough oxygen. Sickle cell anemia causes organ damage and pain. It also increases the risk of infection. °Follow these instructions at home: °Medicines °· Take over-the-counter and prescription medicines only as told by your doctor. °· If you were prescribed an antibiotic medicine, take it as told by your doctor. Do not stop taking the antibiotic even if you start to feel better. °· If you develop a fever, do not take medicines to lower the fever right away. Tell your doctor about the fever. °Managing pain, stiffness, and swelling °· Try these methods to help with pain: °? Use a heating pad. °? Take a warm bath. °? Distract yourself, such as by watching TV. °Eating and drinking °· Drink enough fluid to keep your pee (urine) clear or pale yellow. Drink more in hot weather and during exercise. °· Limit or avoid alcohol. °· Eat a healthy diet. Eat plenty of fruits, vegetables, whole grains, and lean protein. °· Take vitamins and supplements as told by your doctor. °Traveling °· When traveling, keep these with you: °? Your medical information. °? The names of your doctors. °? Your medicines. °· If you need to take an airplane, talk to your doctor first. °Activity °· Rest often. °· Avoid exercises that make your heart beat much faster, such as jogging. °General instructions °· Do not use products that have nicotine or tobacco, such as cigarettes and e-cigarettes. If you need help quitting, ask your doctor. °· Consider wearing a medical alert bracelet. °· Avoid being in high places (high altitudes), such as mountains. °· Avoid very hot or cold temperatures. °· Avoid places where the  temperature changes a lot. °· Keep all follow-up visits as told by your doctor. This is important. °Contact a doctor if: °· A joint hurts. °· Your feet or hands hurt or swell. °· You feel tired (fatigued). °Get help right away if: °· You have symptoms of infection. These include: °? Fever. °? Chills. °? Being very tired. °? Irritability. °? Poor eating. °? Throwing up (vomiting). °· You feel dizzy or faint. °· You have new stomach pain, especially on the left side. °· You have a an erection (priapism) that lasts more than 4 hours. °· You have numbness in your arms or legs. °· You have a hard time moving your arms or legs. °· You have trouble talking. °· You have pain that does not go away when you take medicine. °· You are short of breath. °· You are breathing fast. °· You have a long-term cough. °· You have pain in your chest. °· You have a bad headache. °· You have a stiff neck. °· Your stomach looks bloated even though you did not eat much. °· Your skin is pale. °· You suddenly cannot see well. °Summary °· Sickle cell anemia is a condition where your red blood cells are shaped like sickles. °· Follow your doctor's advice on ways to manage pain, food to eat, activities to do, and steps to take for safe travel. °· Get medical help right away if you have any signs of infection, such as a fever. °This information is not intended to replace advice given to you by your   health care provider. Make sure you discuss any questions you have with your health care provider. °Document Released: 11/21/2012 Document Revised: 03/08/2016 Document Reviewed: 03/08/2016 °Elsevier Interactive Patient Education © 2019 [employer] Inc[employer] ° °

## 2018-07-20 NOTE — Discharge Summary (Signed)
[facility] Discharge Summary   Patient ID: Timothy Marks MRN: [id number] DOB/AGE: 30-18-1992 30 y.o.  Admit date: 07/20/2018 Discharge date: 07/20/2018  Primary Care Physician:  Lanae Boast, [place]  Admission Diagnoses:  Active Problems:   Sickle cell pain crisis [facility])   Discharge Medications:  Allergies as of 07/20/2018      Reactions   Buprenorphine Hcl Hives   Levaquin [levofloxacin] Itching   Meperidine Rash   Morphine And Related Hives   Toradol [ketorolac Tromethamine] Itching   Tramadol Hives   Vancomycin Itching   Zosyn [piperacillin Sod-tazobactam So] Itching, Rash   Has taken rocephin in past      Medication List    TAKE these medications   aspirin EC 81 MG tablet Take 1 tablet (81 mg total) by mouth daily.   cholecalciferol 25 MCG (1000 UT) tablet Commonly known as:  VITAMIN D Take 2 tablets (2,000 Units total) by mouth daily.   folic acid 1 MG tablet Commonly known as:  FOLVITE Take 1 tablet (1 mg total) by mouth daily.   gabapentin 300 MG capsule Commonly known as:  NEURONTIN TAKE 1 CAPSULE(300 MG) BY MOUTH THREE TIMES DAILY What changed:  See the new instructions.   hydroxyurea 500 MG capsule Commonly known as:  HYDREA Take 1 capsule (500 mg total) by mouth 2 (two) times daily. May take with food to minimize GI side effects.   ibuprofen 800 MG tablet Commonly known as:  ADVIL Take 1 tablet (800 mg total) by mouth 3 (three) times daily as needed (pain).   oxyCODONE 20 mg 12 hr tablet Commonly known as:  OXYCONTIN Take 1 tablet (20 mg total) by mouth every 12 (twelve) hours for 30 days.   oxyCODONE-acetaminophen 10-325 MG tablet Commonly known as:  PERCOCET Take 1 tablet by mouth every 6 (six) hours as needed for up to 15 days for pain.        Consults:  None  Significant Diagnostic Studies:  No results found.   Sick[facility] Course:  Timothy Marks, a 28 year old male with a medical history significant  for sickle cell disease, chronic pain syndrome, opiate dependence, and history of adjustment disorder was admitted to sickle cell day infusion center for pain management and extended observation.  All laboratory values reviewed, hemoglobin 9.3.  No clinical indication for blood transfusion on today. Potassium 3.3, not repleated in clinic.  Patient advised to continue home medications.  Patient afebrile and maintaining oxygen saturation at 100% on RA.  IV fluids initiated 0.45% saline at 100 mL/h Tylenol 1000 mg by mouth x1 Toradol 15 mg IV x1 IV Dilaudid via PCA with settings of 0.5 mg, 10-minute lockout, and 3 mg/h.  Patient used a total of 8.5 mg, with 19 demands, and 17 delivered. Pain intensity decreased to 4/10, which is consistent with patient's goal. Patient states that he can manage at home on current medication regimen. Patient is aware of all upcoming appointments. Patient alert, oriented, and ambulating without assistance. He will discharge home with family in a hemodynamically stable condition.  Discharge instructions: Resume all home medications.  Follow up with PCP as previously  scheduled.   Discussed the importance of drinking 64 ounces of water daily to  help prevent pain crises, it is important to drink plenty of water throughout the day. This is because dehydration of red blood cells may lead further sickling.   Avoid all stressors that precipitate sickle cell pain crisis.     The patient  was given clear instructions to go to ER or return to medical center if symptoms do not improve, worsen or new problems develop.    Physical Exam at Discharge:  BP (!) 106/59 (BP Location: Right Arm)   Pulse 72   Temp 98.6 F (37 C) (Oral)   Resp 12   SpO2 100%   Physical Exam HENT:     Nose: Nose normal.     Mouth/Throat:     Mouth: Mucous membranes are moist.  Eyes:     Pupils: Pupils are equal, round, and reactive to light.  Cardiovascular:     Rate and Rhythm:  Normal rate and regular rhythm.     Pulses: Normal pulses.     Heart sounds: Normal heart sounds.  Pulmonary:     Effort: Pulmonary effort is normal.     Breath sounds: Normal breath sounds.  Abdominal:     General: Abdomen is flat. Bowel sounds are normal.  Musculoskeletal: Normal range of motion.  Skin:    General: Skin is warm and dry.  Neurological:     General: No focal deficit present.     Mental Status: He is alert. Mental status is at baseline.  Psychiatric:        Mood and Affect: Mood normal.        Behavior: Behavior normal.        Thought Content: Thought content normal.        Judgment: Judgment normal.      Disposition at Discharge: Discharge disposition: 01-Home or Self Care       Discharge Orders: Discharge Instructions    Discharge patient   Complete by:  As directed    Discharge disposition:  01-Home or Self Care   Discharge patient date:  07/20/2018      Condition at Discharge:   Stable  Time spent on Discharge:  Greater than 30 minutes.  Signed: [name]  APRN, MSN, FNP-C [facility] [address] [place], [place] [address] [phone]  07/20/2018, 4:31 PM

## 2018-07-20 NOTE — H&P (Signed)
[facility] History and Physical   Date: 07/20/2018  Patient name: Timothy Marks Medical record number: [id number] Date of birth: [birth year] Age: 28 y.o. Gender: male PCP: Lanae Boast, [place]  Attending physician: Tresa Garter, MD  Chief Complaint: Low back pain  History of Present Illness: Timothy Marks, a 28 year old male with a medical history significant for sickle cell disease, type SS, chronic pain syndrome, opiate dependence, and history of adjustment disorder with mixed disturbance of emotions and conduct presents complaining of low back pain that is consistent with typical sickle cell pain crisis.  Patient states that pain intensity has been increased over the past 24 hours and has been unrelieved by home medications.  Patient was treated and evaluated in the emergency department on 07/15/2018 for this problem.  Patient last had OxyContin and Percocet this a.m. without sustained relief.  Patient characterizes pain as 9/10, constant, and throbbing.  Patient has not identified any provocative factors related to current crisis.  Patient denies sick contacts, recent travel, or exposure to COVID-19.  He denies fever, dizziness, chills, dysuria, abdominal pain, nausea, vomiting, or diarrhea.  Meds: Medications Prior to Admission  Medication Sig Dispense Refill Last Dose  . aspirin EC 81 MG tablet Take 1 tablet (81 mg total) by mouth daily. 30 tablet 11 Taking  . cholecalciferol (VITAMIN D) 1000 units tablet Take 2 tablets (2,000 Units total) by mouth daily. 30 tablet 6 Taking  . folic acid (FOLVITE) 1 MG tablet Take 1 tablet (1 mg total) by mouth daily. 90 tablet 2 Taking  . gabapentin (NEURONTIN) 300 MG capsule TAKE 1 CAPSULE(300 MG) BY MOUTH THREE TIMES DAILY (Patient taking differently: Take 300 mg by mouth 3 (three) times daily. ) 90 capsule 0 Taking  . hydroxyurea (HYDREA) 500 MG capsule Take 1 capsule (500 mg total) by mouth 2 (two) times daily. May take with food  to minimize GI side effects. 180 capsule 2 Taking  . ibuprofen (ADVIL,MOTRIN) 800 MG tablet Take 1 tablet (800 mg total) by mouth 3 (three) times daily as needed (pain). 30 tablet 0 Taking  . oxyCODONE (OXYCONTIN) 20 mg 12 hr tablet Take 1 tablet (20 mg total) by mouth every 12 (twelve) hours for 30 days. 60 tablet 0   . oxyCODONE-acetaminophen (PERCOCET) 10-325 MG tablet Take 1 tablet by mouth every 6 (six) hours as needed for up to 15 days for pain. 60 tablet 0     Allergies: Buprenorphine hcl; Levaquin [levofloxacin]; Meperidine; Morphine and related; Toradol [ketorolac tromethamine]; Tramadol; Vancomycin; and Zosyn [piperacillin sod-tazobactam so] Past Medical History:  Diagnosis Date  . Depression   . 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 Sickle cell anemia (HCC)   . Vitamin D deficiency y-6   Past Surgical History:  Procedure Laterality Date  . CHOLECYSTECTOMY     Family History  Problem Relation Age of Onset  . Sickle cell trait Mother   . Sickle cell trait Father    Social History   Socioeconomic History  . Marital status: Single    Spouse name: Not on file  . Number of children: Not on file  . Years of education: Not on file  . Highest education level: Not on file  Occupational History  . Not on file  Social Needs  . Financial resource strain: Not on file  . Food insecurity:    Worry: Not on file    Inability: Not on file  .  Transportation needs:    Medical: Not on file    Non-medical: Not on file  Tobacco Use  . Smoking status: Never Smoker  . Smokeless tobacco: Never Used  Substance and Sexual Activity  . Alcohol use: No  . Drug use: Not Currently    Types: Marijuana  . Sexual activity: Not on file  Lifestyle  . Physical activity:    Days per week: Not on file    Minutes per session: Not on file  . Stress: Not on file  Relationships  . Social connections:    Talks on phone: Not on file     Gets together: Not on file    Attends religious service: Not on file    Active member of club or organization: Not on file    Attends meetings of clubs or organizations: Not on file    Relationship status: Not on file  . Intimate partner violence:    Fear of current or ex partner: Not on file    Emotionally abused: Not on file    Physically abused: Not on file    Forced sexual activity: Not on file  Other Topics Concern  . Not on file  Social History Narrative  . Not on file   Review of Systems  Constitutional: Negative for chills and fever.  HENT: Negative.   Eyes: Negative.   Respiratory: Negative.   Cardiovascular: Negative.   Gastrointestinal: Negative.   Genitourinary: Negative.  Negative for dysuria.  Musculoskeletal: Positive for back pain.  Skin: Negative.   Neurological: Negative.   Psychiatric/Behavioral: Negative.  Negative for depression. The patient is not nervous/anxious.      Physical Exam: There were no vitals taken for this visit. Physical Exam Constitutional:      Appearance: Normal appearance.  HENT:     Head: Normocephalic.     Mouth/Throat:     Mouth: Mucous membranes are moist.  Eyes:     Pupils: Pupils are equal, round, and reactive to light.  Cardiovascular:     Rate and Rhythm: Normal rate and regular rhythm.  Pulmonary:     Effort: Pulmonary effort is normal.     Breath sounds: Normal breath sounds.  Abdominal:     General: Abdomen is flat. Bowel sounds are normal.  Musculoskeletal: Normal range of motion.  Skin:    General: Skin is warm and dry.  Neurological:     General: No focal deficit present.     Mental Status: He is alert. Mental status is at baseline.  Psychiatric:        Mood and Affect: Mood normal.        Behavior: Behavior normal.        Thought Content: Thought content normal.        Judgment: Judgment normal.      Lab results: No results found for this or any previous visit (from the past 24 hour(s)).  Imaging  results:  No results found.   Assessment & Plan:  Patient will be admitted to the day infusion center for extended observation  Initiate 0.45% saline at 100 ml/hour for cellular rehydration  Toradol 15 mg IV times one for inflammation.  Tylenol 1000 mg times one  Start Dilaudid PCA High Concentration per weight based protocol.    Patient will be re-evaluated for pain intensity in the context of function and relationship to baseline as care progresses.  If no significant pain relief, will transfer patient to inpatient services for a higher level of care.  Review CBC with differential, CMP and reticulocytes as results become available   [name]  APRN, MSN, FNP-C [facility] [address] [place], [place] [address] [phone]  07/20/2018, 11:48 AM

## 2018-07-20 NOTE — Telephone Encounter (Signed)
Patient called requesting to come to the day infusion hospital. Reports lower back pain rated 8/10. Per patient, last took Percocet for pain at 9:00 am. COVID-19 screening done and patient denies symptoms. Denies fever, chest pain, nausea, vomiting, diarrhea, abdominal pain and priapism. Admits to having transportation at discharge without driving self. [place], [place] notified. Patient can come to the day hospital for treatment. Patient advised and expresses an understanding.

## 2018-07-20 NOTE — Progress Notes (Signed)
Patient admitted to the day hospital for treatment of sickle cell pain crisis. Patient reported pain rated 8/10 in the back . Patient placed on Dilaudid PCA, given PO Tylenol, IV toradol and hydrated with IV fluids. At discharge patient reported  pain at 5/10. Discharge instructions given to patient. Patient alert, oriented and ambulatory at discharge.

## 2018-07-22 ENCOUNTER — Other Ambulatory Visit: Payer: Self-pay

## 2018-07-22 ENCOUNTER — Encounter (HOSPITAL_COMMUNITY): Payer: Self-pay

## 2018-07-22 ENCOUNTER — Emergency Department (HOSPITAL_COMMUNITY)
Admission: EM | Admit: 2018-07-22 | Discharge: 2018-07-23 | Disposition: A | Discharge status: Home / Self Care | Payer: Medicaid Other | Attending: Emergency Medicine | Admitting: Emergency Medicine

## 2018-07-22 DIAGNOSIS — Z79899 Other long term (current) drug therapy: Secondary | ICD-10-CM | POA: Insufficient documentation

## 2018-07-22 DIAGNOSIS — D5700 Hb-SS disease with crisis, unspecified: Secondary | ICD-10-CM | POA: Diagnosis not present

## 2018-07-22 DIAGNOSIS — Z20828 Contact with and (suspected) exposure to other viral communicable diseases: Secondary | ICD-10-CM | POA: Diagnosis not present

## 2018-07-22 DIAGNOSIS — Z7982 Long term (current) use of aspirin: Secondary | ICD-10-CM | POA: Insufficient documentation

## 2018-07-22 DIAGNOSIS — M545 Low back pain: Secondary | ICD-10-CM | POA: Diagnosis present

## 2018-07-22 LAB — COMPREHENSIVE METABOLIC PANEL
ALT: 9 U/L (ref 0–44)
AST: 21 U/L (ref 15–41)
Albumin: 4.3 g/dL (ref 3.5–5.0)
Alkaline Phosphatase: 71 U/L (ref 38–126)
Anion gap: 10 (ref 5–15)
BUN: 5 mg/dL — ABNORMAL LOW (ref 6–20)
CO2: 26 mmol/L (ref 22–32)
Calcium: 9 mg/dL (ref 8.9–10.3)
Chloride: 102 mmol/L (ref 98–111)
Creatinine, Ser: 0.7 mg/dL (ref 0.61–1.24)
GFR calc Af Amer: >60 mL/min (ref >60)
GFR calc non Af Amer: >60 mL/min (ref >60)
Glucose, Bld: 98 mg/dL (ref 70–99)
Potassium: 3 mmol/L — ABNORMAL LOW (ref 3.5–5.1)
Sodium: 138 mmol/L (ref 135–145)
Total Bilirubin: 2.7 mg/dL — ABNORMAL HIGH (ref 0.3–1.2)
Total Protein: 7.7 g/dL (ref 6.5–8.1)

## 2018-07-22 LAB — CBC WITH DIFFERENTIAL/PLATELET
Abs Immature Granulocytes: 0.09 10*3/uL — ABNORMAL HIGH (ref 0.00–0.07)
Basophils Absolute: 0 10*3/uL (ref 0.0–0.1)
Basophils Relative: 0 %
Eosinophils Absolute: 0.2 10*3/uL (ref 0.0–0.5)
Eosinophils Relative: 2 %
HCT: 24.3 % — ABNORMAL LOW (ref 39.0–52.0)
Hemoglobin: 8.1 g/dL — ABNORMAL LOW (ref 13.0–17.0)
Immature Granulocytes: 1 %
Lymphocytes Relative: 32 %
Lymphs Abs: 3.6 10*3/uL (ref 0.7–4.0)
MCH: 27.9 pg (ref 26.0–34.0)
MCHC: 33.3 g/dL (ref 30.0–36.0)
MCV: 83.8 fL (ref 80.0–100.0)
Monocytes Absolute: 0.9 10*3/uL (ref 0.1–1.0)
Monocytes Relative: 8 %
Neutro Abs: 6.4 10*3/uL (ref 1.7–7.7)
Neutrophils Relative %: 57 %
Platelets: 482 10*3/uL — ABNORMAL HIGH (ref 150–400)
RBC: 2.9 MIL/uL — ABNORMAL LOW (ref 4.22–5.81)
RDW: 24.9 % — ABNORMAL HIGH (ref 11.5–15.5)
WBC: 11.2 10*3/uL — ABNORMAL HIGH (ref 4.0–10.5)
nRBC: 2.4 % — ABNORMAL HIGH (ref 0.0–0.2)

## 2018-07-22 LAB — RETICULOCYTES
Immature Retic Fract: 41.7 % — ABNORMAL HIGH (ref 2.3–15.9)
RBC.: 2.9 MIL/uL — ABNORMAL LOW (ref 4.22–5.81)
Retic Count, Absolute: 294.6 10*3/uL — ABNORMAL HIGH (ref 19.0–186.0)
Retic Ct Pct: 10.2 % — ABNORMAL HIGH (ref 0.4–3.1)

## 2018-07-22 LAB — SARS CORONAVIRUS 2 BY RT PCR (HOSPITAL ORDER, PERFORMED IN ~~LOC~~ HOSPITAL LAB): SARS Coronavirus 2: NEGATIVE

## 2018-07-22 MED ORDER — HYDROMORPHONE HCL 2 MG/ML IJ SOLN
2.0000 mg | INTRAMUSCULAR | Status: AC
  Administered 2018-07-22: 20:00:00 2 mg via INTRAVENOUS
  Filled 2018-07-22: qty 1

## 2018-07-22 MED ORDER — HYDROMORPHONE HCL 1 MG/ML IJ SOLN
1.0000 mg | Freq: Once | INTRAMUSCULAR | Status: AC
  Administered 2018-07-22: 23:00:00 1 mg via INTRAVENOUS
  Filled 2018-07-22: qty 1

## 2018-07-22 MED ORDER — HYDROMORPHONE HCL 2 MG/ML IJ SOLN: 2.0000 mg | INTRAMUSCULAR | Status: AC

## 2018-07-22 MED ORDER — SODIUM CHLORIDE 0.45 % IV SOLN
INTRAVENOUS | Status: DC
  Administered 2018-07-22: 150 mL/h via INTRAVENOUS

## 2018-07-22 MED ORDER — DIPHENHYDRAMINE HCL 50 MG/ML IJ SOLN
25.0000 mg | Freq: Once | INTRAMUSCULAR | Status: AC
  Administered 2018-07-22: 20:00:00 25 mg via INTRAVENOUS
  Filled 2018-07-22: qty 1

## 2018-07-22 MED ORDER — HYDROMORPHONE HCL 1 MG/ML IJ SOLN
1.0000 mg | Freq: Once | INTRAMUSCULAR | Status: AC
  Administered 2018-07-22: 22:00:00 1 mg via INTRAVENOUS
  Filled 2018-07-22: qty 1

## 2018-07-22 MED ORDER — POTASSIUM CHLORIDE CRYS ER 20 MEQ PO TBCR
40.0000 meq | EXTENDED_RELEASE_TABLET | Freq: Once | ORAL | Status: AC
  Administered 2018-07-22: 23:00:00 40 meq via ORAL
  Filled 2018-07-22: qty 2

## 2018-07-22 MED ORDER — ONDANSETRON HCL 4 MG/2ML IJ SOLN
4.0000 mg | INTRAMUSCULAR | Status: DC | PRN
  Administered 2018-07-22: 20:00:00 4 mg via INTRAVENOUS
  Filled 2018-07-22: qty 2

## 2018-07-22 NOTE — ED Notes (Signed)
Patient requested pain meds. MD notified.

## 2018-07-22 NOTE — ED Triage Notes (Signed)
Pt states SCC in lower back starting earlier today. No relief with home meds.

## 2018-07-22 NOTE — Discharge Instructions (Signed)
Follow up with sickle cell clinic.  Return the emergency department for any further symptoms or concerns.

## 2018-07-22 NOTE — ED Provider Notes (Addendum)
[facility] DEPT Provider Note   CSN: [id number] Arrival date & time: 07/22/18  1809    History   Chief Complaint Chief Complaint  Patient presents with  . Sickle Cell Pain Crisis    HPI Timothy Marks is a 28 y.o. male with PMH/o Depression, Sickle Cell anemia, Vitamin D Deficiency who presents for evaluation of back pain.  He states this is been ongoing issue for last several days.  He states that this pain is consistent with his normal sickle cell crisis pain.  Denies any preceding trauma, injury, fall.  He states that today he is only been taking Tylenol.  He states that he ran out of his home pain medications about 2 days ago.  He states he has not had any fevers.  He denies any numbness/weakness of his arms or legs.  He has not had any difficulty ambulating.  He denies any chest pain, difficulty breathing.  He was seen by the sickle cell clinic on 07/20/18.    The history is provided by the patient.    Past Medical History:  Diagnosis Date  . Depression   . 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 Sickle cell anemia (HCC)   . Vitamin D deficiency y-6    Patient Active Problem List   Diagnosis Date Noted  . Sickle cell anemia with crisis ([place]) 05/22/2018  . Acute bilateral low back pain without sciatica   . Other chronic pain   . Anemia of chronic disease   . 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 Physical deconditioning   . Agitation   . Chronic pain syndrome   . Recurrent cold sores   . Fever   . Pain   . Sickle cell crisis ([place]) 07/16/2017  . Adjustment disorder with mixed disturbance of emotions and conduct   . Constipation 08/03/2014  . 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 Protein-calorie malnutrition, severe ([place]) 04/09/2014  . Major depressive disorder, recurrent, severe without psychotic features ([place])   . 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 Vitamin D  deficiency   . Sickle cell anemia ([place]) 03/30/2014  . Hyperbilirubinemia 03/30/2014  . Hypokalemia 02/07/2014  . Aggressive behavior 01/25/2013  . Sickle cell pain crisis ([place]) 01/24/2013    Past Surgical History:  Procedure Laterality Date  . CHOLECYSTECTOMY          Home Medications    Prior to Admission medications   Medication Sig Start Date End Date Taking? Authorizing Provider  cholecalciferol (VITAMIN D) 1000 units tablet Take 2 tablets (2,000 Units total) by mouth daily. 12/13/17  Yes Azzie Glatter, FNP  folic acid (FOLVITE) 1 MG tablet Take 1 tablet (1 mg total) by mouth daily. 05/01/17  Yes Dorena Dew, FNP  gabapentin (NEURONTIN) 300 MG capsule TAKE 1 CAPSULE(300 MG) BY MOUTH THREE TIMES DAILY Patient taking differently: Take 300 mg by mouth 3 (three) times daily.  05/08/18  Yes Lanae Boast, FNP  hydroxyurea (HYDREA) 500 MG capsule Take 1 capsule (500 mg total) by mouth 2 (two) times daily. May take with food to minimize GI side effects. 05/01/17  Yes Dorena Dew, FNP  ibuprofen (ADVIL,MOTRIN) 800 MG tablet Take 1 tablet (800 mg total) by mouth 3 (three) times daily as needed (pain). 12/20/17  Yes Azzie Glatter, FNP  oxyCODONE (OXYCONTIN) 20 mg 12 hr tablet Take 1 tablet (20 mg total) by mouth every 12 (twelve) hours  for 30 days. 06/28/18 07/28/18 Yes Lanae Boast, FNP  oxyCODONE-acetaminophen (PERCOCET) 10-325 MG tablet Take 1 tablet by mouth every 6 (six) hours as needed for up to 15 days for pain. 07/13/18 07/28/18 Yes Lanae Boast, FNP  aspirin EC 81 MG tablet Take 1 tablet (81 mg total) by mouth daily. Patient not taking: Reported on 07/22/2018 06/02/17   Dorena Dew, FNP    Family History Family History  Problem Relation Age of Onset  . Sickle cell trait Mother   . Sickle cell trait Father     Social History Social History   Tobacco Use  . Smoking status: Never Smoker  . Smokeless tobacco: Never Used  Substance Use Topics  . Alcohol  use: No  . Drug use: Not Currently    Types: Marijuana     Allergies   Buprenorphine hcl; Levaquin [levofloxacin]; Meperidine; Morphine and related; Toradol [ketorolac tromethamine]; Tramadol; Vancomycin; and Zosyn [piperacillin sod-tazobactam so]   Review of Systems Review of Systems  Constitutional: Negative for fever.  Respiratory: Negative for shortness of breath.   Cardiovascular: Negative for chest pain.  Gastrointestinal: Negative for abdominal pain, nausea and vomiting.  Musculoskeletal: Positive for back pain.  Neurological: Negative for weakness, numbness and headaches.  All other systems reviewed and are negative.    Physical Exam Updated Vital Signs BP 109/63   Pulse 88   Temp 98.7 F (37.1 C) (Oral)   Resp 15   Ht (S) 6\' 2"  (1.88 m)   Wt (S) 61 kg   SpO2 100%   BMI 17.27 kg/m   Physical Exam Vitals signs and nursing note reviewed.  Constitutional:      Appearance: Normal appearance. He is well-developed.  HENT:     Head: Normocephalic and atraumatic.  Eyes:     General: Lids are normal. No scleral icterus.       Right eye: No discharge.        Left eye: No discharge.     Conjunctiva/sclera: Conjunctivae normal.     Pupils: Pupils are equal, round, and reactive to light.  Neck:     Musculoskeletal: Full passive range of motion without pain.  Cardiovascular:     Rate and Rhythm: Normal rate and regular rhythm.     Pulses: Normal pulses.     Heart sounds: Normal heart sounds. No murmur. No friction rub. No gallop.   Pulmonary:     Effort: Pulmonary effort is normal.     Breath sounds: Normal breath sounds.     Comments: Lungs clear to auscultation bilaterally.  Symmetric chest rise.  No wheezing, rales, rhonchi. Abdominal:     Palpations: Abdomen is soft. Abdomen is not rigid.     Tenderness: There is no abdominal tenderness. There is no guarding.  Musculoskeletal: Normal range of motion.       Back:     Comments: Diffuse tenderness noted to  the lower lumbar area.  No deformity or crepitus noted.  No true bony midline tenderness.  No midline thoracic or C-spine tenderness.  Skin:    General: Skin is warm and dry.     Capillary Refill: Capillary refill takes less than 2 seconds.  Neurological:     Mental Status: He is alert and oriented to person, place, and time.     Comments: Follows commands, Moves all extremities  5/5 strength to BUE and BLE  Sensation intact throughout all major nerve distributions Normal gait  Psychiatric:        Speech:  Speech normal.        Behavior: Behavior normal.      ED Treatments / Results  Labs (all labs ordered are listed, but only abnormal results are displayed) Labs Reviewed  COMPREHENSIVE METABOLIC PANEL - Abnormal; Notable for the following components:      Result Value   Potassium 3.0 (*)    BUN 5 (*)    Total Bilirubin 2.7 (*)    All other components within normal limits  CBC WITH DIFFERENTIAL/PLATELET - Abnormal; Notable for the following components:   WBC 11.2 (*)    RBC 2.90 (*)    Hemoglobin 8.1 (*)    HCT 24.3 (*)    RDW 24.9 (*)    Platelets 482 (*)    nRBC 2.4 (*)    Abs Immature Granulocytes 0.09 (*)    All other components within normal limits  RETICULOCYTES - Abnormal; Notable for the following components:   Retic Ct Pct 10.2 (*)    RBC. 2.90 (*)    Retic Count, Absolute 294.6 (*)    Immature Retic Fract 41.7 (*)    All other components within normal limits  SARS CORONAVIRUS 2 (HOSPITAL ORDER, [place] [facility])    EKG None  Radiology No results found.  Procedures Procedures (including critical care time)  Medications Ordered in ED Medications  0.45 % sodium chloride infusion ( Intravenous Stopped 07/23/18 0042)  ondansetron (ZOFRAN) injection 4 mg (4 mg Intravenous Given 07/22/18 1945)  diphenhydrAMINE (BENADRYL) injection 25 mg (25 mg Intravenous Given 07/22/18 1946)  HYDROmorphone (DILAUDID) injection 2 mg (2 mg Intravenous Given  07/22/18 1946)    Or  HYDROmorphone (DILAUDID) injection 2 mg ( Subcutaneous See Alternative 07/22/18 1946)  HYDROmorphone (DILAUDID) injection 1 mg (1 mg Intravenous Given 07/22/18 2138)  HYDROmorphone (DILAUDID) injection 1 mg (1 mg Intravenous Given 07/22/18 2302)  potassium chloride SA (K-DUR) CR tablet 40 mEq (40 mEq Oral Given 07/22/18 2301)     Initial Impression / Assessment and Plan / ED Course  I have reviewed the triage vital signs and the nursing notes.  Pertinent labs & imaging results that were available during my care of the patient were reviewed by me and considered in my medical decision making (see chart for details).        28 year old male past medical or sickle cell anemia presents for evaluation of back pain.  He states is consistent with the sickle cell pain.  No chest pain, fevers, difficulty breathing.  Reports he ran out of his pain medications.  No fevers, trauma, injury.  No numbness/weakness of arms or legs, saddle anesthesia, urinary bowel incontinence. Patient is afebrile, non-toxic appearing, sitting comfortably on examination table. Vital signs reviewed and stable.    CMP shows potassium of 3.0.  Bili 2.7 which is consistent with previous blood work.  Patient given a small dose of potassium repletion here in the ED.  CBC shows slight leukocytosis of 11.2.  Hemoglobin stable at 8.1.  It is down slightly from 2 days ago but he consistently is in between 7-9.  Reticulocyte count unremarkable.  COVID negative.  Patient after initial dose of pain medication.  He states his pain is better slightly but he still having significant pain.  Will give additional dose.  Reevaluation after 3 doses of pain medication.  He still states he is having some small amount of pain.  I did discuss with patient regarding admission versus discharge and did offer him admission for further pain  control.  Patient declines admission at this time and wishes to go home.  He did drive himself here so  cannot give him any more sedative medication.     RN inform you that patient is ready to be discharged.  Reevaluation.  Patient is alert. Vitals stable. Enourage him to follow-up with sickle cell clinic. At this time, patient exhibits no emergent life-threatening condition that require further evaluation in ED or admission. Patient had ample opportunity for questions and discussion. All patient's questions were answered with full understanding.  Portions of this note were generated with [employer]. Dictation errors may occur despite best attempts at proofreading.   Final Clinical Impressions(s) / ED Diagnoses   Final diagnoses:  Sickle cell pain crisis [facility])    ED Discharge Orders    None       Volanda Napoleon, PA-C 07/23/18 0058    Volanda Napoleon, PA-C 07/23/18 0059    Little, Wenda Overland, MD 07/23/18 2113

## 2018-07-23 NOTE — ED Notes (Signed)
Patient walked around nursing station by himself and had not difficulty walking.

## 2018-07-24 ENCOUNTER — Encounter (HOSPITAL_COMMUNITY): Payer: Self-pay

## 2018-07-24 ENCOUNTER — Other Ambulatory Visit: Payer: Self-pay

## 2018-07-24 ENCOUNTER — Emergency Department (HOSPITAL_COMMUNITY)
Admission: EM | Admit: 2018-07-24 | Discharge: 2018-07-24 | Disposition: A | Discharge status: Home / Self Care | Payer: Medicaid Other | Attending: Emergency Medicine | Admitting: Emergency Medicine

## 2018-07-24 DIAGNOSIS — D5700 Hb-SS disease with crisis, unspecified: Secondary | ICD-10-CM | POA: Insufficient documentation

## 2018-07-24 DIAGNOSIS — M545 Low back pain: Secondary | ICD-10-CM | POA: Diagnosis present

## 2018-07-24 DIAGNOSIS — Z79899 Other long term (current) drug therapy: Secondary | ICD-10-CM | POA: Insufficient documentation

## 2018-07-24 DIAGNOSIS — Z862 Personal history of diseases of the blood and blood-forming organs and certain disorders involving the immune mechanism: Secondary | ICD-10-CM | POA: Insufficient documentation

## 2018-07-24 LAB — CBC WITH DIFFERENTIAL/PLATELET
Abs Immature Granulocytes: 0.06 10*3/uL (ref 0.00–0.07)
Basophils Absolute: 0 10*3/uL (ref 0.0–0.1)
Basophils Relative: 0 %
Eosinophils Absolute: 0.6 10*3/uL — ABNORMAL HIGH (ref 0.0–0.5)
Eosinophils Relative: 4 %
HCT: 28.6 % — ABNORMAL LOW (ref 39.0–52.0)
Hemoglobin: 9.8 g/dL — ABNORMAL LOW (ref 13.0–17.0)
Immature Granulocytes: 1 %
Lymphocytes Relative: 24 %
Lymphs Abs: 3.2 10*3/uL (ref 0.7–4.0)
MCH: 29.3 pg (ref 26.0–34.0)
MCHC: 34.3 g/dL (ref 30.0–36.0)
MCV: 85.6 fL (ref 80.0–100.0)
Monocytes Absolute: 1 10*3/uL (ref 0.1–1.0)
Monocytes Relative: 8 %
Neutro Abs: 8.4 10*3/uL — ABNORMAL HIGH (ref 1.7–7.7)
Neutrophils Relative %: 63 %
Platelets: 532 10*3/uL — ABNORMAL HIGH (ref 150–400)
RBC: 3.34 MIL/uL — ABNORMAL LOW (ref 4.22–5.81)
RDW: 25.2 % — ABNORMAL HIGH (ref 11.5–15.5)
WBC: 13.3 10*3/uL — ABNORMAL HIGH (ref 4.0–10.5)
nRBC: 2.8 % — ABNORMAL HIGH (ref 0.0–0.2)

## 2018-07-24 LAB — COMPREHENSIVE METABOLIC PANEL
ALT: 12 U/L (ref 0–44)
AST: 24 U/L (ref 15–41)
Albumin: 4.7 g/dL (ref 3.5–5.0)
Alkaline Phosphatase: 78 U/L (ref 38–126)
Anion gap: 10 (ref 5–15)
BUN: 5 mg/dL — ABNORMAL LOW (ref 6–20)
CO2: 24 mmol/L (ref 22–32)
Calcium: 9.2 mg/dL (ref 8.9–10.3)
Chloride: 106 mmol/L (ref 98–111)
Creatinine, Ser: 0.64 mg/dL (ref 0.61–1.24)
GFR calc Af Amer: >60 mL/min (ref >60)
GFR calc non Af Amer: >60 mL/min (ref >60)
Glucose, Bld: 89 mg/dL (ref 70–99)
Potassium: 3.4 mmol/L — ABNORMAL LOW (ref 3.5–5.1)
Sodium: 140 mmol/L (ref 135–145)
Total Bilirubin: 3.3 mg/dL — ABNORMAL HIGH (ref 0.3–1.2)
Total Protein: 8.3 g/dL — ABNORMAL HIGH (ref 6.5–8.1)

## 2018-07-24 LAB — RETICULOCYTES
Immature Retic Fract: 47.3 % — ABNORMAL HIGH (ref 2.3–15.9)
RBC.: 3.34 MIL/uL — ABNORMAL LOW (ref 4.22–5.81)
Retic Count, Absolute: 353.7 10*3/uL — ABNORMAL HIGH (ref 19.0–186.0)
Retic Ct Pct: 10.6 % — ABNORMAL HIGH (ref 0.4–3.1)

## 2018-07-24 LAB — APTT: aPTT: 20 seconds — ABNORMAL LOW (ref 24–36)

## 2018-07-24 LAB — PROTIME-INR
INR: 1.1 (ref 0.8–1.2)
Prothrombin Time: 13.7 seconds (ref 11.4–15.2)

## 2018-07-24 MED ORDER — HYDROMORPHONE HCL 2 MG/ML IJ SOLN: 2.0000 mg | INTRAMUSCULAR | Status: AC

## 2018-07-24 MED ORDER — POTASSIUM CHLORIDE CRYS ER 20 MEQ PO TBCR
40.0000 meq | EXTENDED_RELEASE_TABLET | Freq: Once | ORAL | Status: AC
  Administered 2018-07-24: 40 meq via ORAL
  Filled 2018-07-24: qty 2

## 2018-07-24 MED ORDER — HYDROMORPHONE HCL 2 MG/ML IJ SOLN
2.0000 mg | INTRAMUSCULAR | Status: AC
  Administered 2018-07-24: 2 mg via INTRAVENOUS
  Filled 2018-07-24: qty 1

## 2018-07-24 MED ORDER — DIPHENHYDRAMINE HCL 50 MG/ML IJ SOLN
25.0000 mg | Freq: Once | INTRAMUSCULAR | Status: AC
  Administered 2018-07-24: 25 mg via INTRAVENOUS
  Filled 2018-07-24: qty 1

## 2018-07-24 MED ORDER — ONDANSETRON HCL 4 MG/2ML IJ SOLN
4.0000 mg | Freq: Once | INTRAMUSCULAR | Status: AC
  Administered 2018-07-24: 4 mg via INTRAVENOUS
  Filled 2018-07-24: qty 2

## 2018-07-24 MED ORDER — HYDROMORPHONE HCL 2 MG/ML IJ SOLN
2.0000 mg | INTRAMUSCULAR | Status: DC
  Administered 2018-07-24: 08:00:00 2 mg via INTRAVENOUS
  Filled 2018-07-24: qty 1

## 2018-07-24 MED ORDER — HYDROMORPHONE HCL 2 MG/ML IJ SOLN
2.0000 mg | INTRAMUSCULAR | Status: AC
  Administered 2018-07-24: 06:00:00 2 mg via INTRAVENOUS
  Filled 2018-07-24: qty 1

## 2018-07-24 MED ORDER — HYDROMORPHONE HCL 2 MG/ML IJ SOLN: 2.0000 mg | INTRAMUSCULAR | Status: DC

## 2018-07-24 MED ORDER — SODIUM CHLORIDE 0.45 % IV SOLN
INTRAVENOUS | Status: DC
  Administered 2018-07-24: 06:00:00 via INTRAVENOUS

## 2018-07-24 NOTE — Discharge Instructions (Addendum)
It was our pleasure to provide your ER care today - we hope that you feel better.  Rest. Drink adequate fluids. Take your pain medication as prescribed, as need.   Overall, your lab work looks good, and vital signs normal. Your potassium level is slightly low (3.4) - eat plenty of fruits and vegetables, and follow up with your doctor.  You were given 3 doses of pain medication in the ER this AM - no driving for the next 8 hours.   Follow up with your doctors in the clinic in the next 1-2 days.  Return to ER if worse, new symptoms, fevers, trouble breathing, other concern.

## 2018-07-24 NOTE — ED Provider Notes (Signed)
[place] DEPT Provider Note   CSN: [id number] Arrival date & time: 07/24/18  0421    History   Chief Complaint Chief Complaint  Patient presents with  . Sickle Cell Pain Crisis    HPI Timothy Marks is a 28 y.o. male.   The history is provided by the patient.  Sickle Cell Pain Crisis  He has history of sickle cell disease and comes in with a recurrent pain in his lower back.  Pain started last night and is typical of his sickle cell pain.  He rates pain a 10/10.  He has taken oxycodone at home without relief.  He denies fever or chills.  He denies dyspnea.  He denies chest pain.  Past Medical History:  Diagnosis Date  . Depression   . 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 Sickle cell anemia (HCC)   . Vitamin D deficiency y-6    Patient Active Problem List   Diagnosis Date Noted  . Sickle cell anemia with crisis ([place]) 05/22/2018  . Acute bilateral low back pain without sciatica   . Other chronic pain   . Anemia of chronic disease   . 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 Physical deconditioning   . Agitation   . Chronic pain syndrome   . Recurrent cold sores   . Fever   . Pain   . Sickle cell crisis ([place]) 07/16/2017  . Adjustment disorder with mixed disturbance of emotions and conduct   . Constipation 08/03/2014  . 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 Protein-calorie malnutrition, severe ([place]) 04/09/2014  . Major depressive disorder, recurrent, severe without psychotic features ([place])   . 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 Vitamin D deficiency   . Sickle cell anemia ([place]) 03/30/2014  . Hyperbilirubinemia 03/30/2014  . Hypokalemia 02/07/2014  . Aggressive behavior 01/25/2013  . Sickle cell pain crisis ([place]) 01/24/2013    Past Surgical History:  Procedure Laterality Date  . CHOLECYSTECTOMY          Home Medications    Prior to Admission  medications   Medication Sig Start Date End Date Taking? Authorizing Provider  aspirin EC 81 MG tablet Take 1 tablet (81 mg total) by mouth daily. Patient not taking: Reported on 07/22/2018 06/02/17   Dorena Dew, FNP  cholecalciferol (VITAMIN D) 1000 units tablet Take 2 tablets (2,000 Units total) by mouth daily. 12/13/17   Azzie Glatter, FNP  folic acid (FOLVITE) 1 MG tablet Take 1 tablet (1 mg total) by mouth daily. 05/01/17   Dorena Dew, FNP  gabapentin (NEURONTIN) 300 MG capsule TAKE 1 CAPSULE(300 MG) BY MOUTH THREE TIMES DAILY Patient taking differently: Take 300 mg by mouth 3 (three) times daily.  05/08/18   Lanae Boast, FNP  hydroxyurea (HYDREA) 500 MG capsule Take 1 capsule (500 mg total) by mouth 2 (two) times daily. May take with food to minimize GI side effects. 05/01/17   Dorena Dew, FNP  ibuprofen (ADVIL,MOTRIN) 800 MG tablet Take 1 tablet (800 mg total) by mouth 3 (three) times daily as needed (pain). 12/20/17   Azzie Glatter, FNP  oxyCODONE (OXYCONTIN) 20 mg 12 hr tablet Take 1 tablet (20 mg total) by mouth every 12 (twelve) hours for 30 days. 06/28/18 07/28/18  Lanae Boast, FNP  oxyCODONE-acetaminophen (PERCOCET) 10-325 MG tablet Take 1 tablet by mouth every 6 (six) hours as needed for up to 15 days  for pain. 07/13/18 07/28/18  Lanae Boast, FNP    Family History Family History  Problem Relation Age of Onset  . Sickle cell trait Mother   . Sickle cell trait Father     Social History Social History   Tobacco Use  . Smoking status: Never Smoker  . Smokeless tobacco: Never Used  Substance Use Topics  . Alcohol use: No  . Drug use: Not Currently    Types: Marijuana     Allergies   Buprenorphine hcl; Levaquin [levofloxacin]; Meperidine; Morphine and related; Toradol [ketorolac tromethamine]; Tramadol; Vancomycin; and Zosyn [piperacillin sod-tazobactam so]   Review of Systems Review of Systems  All other systems reviewed and are negative.     Physical Exam Updated Vital Signs BP 110/65 (BP Location: Left Arm)   Pulse 79   Temp 98.6 F (37 C) (Oral)   Resp 16   Ht 6\' 2"  (1.88 m)   Wt 61.2 kg   SpO2 100%   BMI 17.33 kg/m   Physical Exam Vitals signs and nursing note reviewed.    28 year old male, resting comfortably and in no acute distress. Vital signs are normal. Oxygen saturation is 100%, which is normal. Head is normocephalic and atraumatic. PERRLA, EOMI. Oropharynx is clear. Neck is nontender and supple without adenopathy or JVD. Back is nontender and there is no CVA tenderness. Lungs are clear without rales, wheezes, or rhonchi. Chest is nontender. Heart has regular rate and rhythm without murmur. Abdomen is soft, flat, nontender without masses or hepatosplenomegaly and peristalsis is normoactive. Extremities have no cyanosis or edema, full range of motion is present. Skin is warm and dry without rash. Neurologic: Mental status is normal, cranial nerves are intact, there are no motor or sensory deficits.  ED Treatments / Results  Labs (all labs ordered are listed, but only abnormal results are displayed) Labs Reviewed  CBC WITH DIFFERENTIAL/PLATELET  PROTIME-INR  APTT  RETICULOCYTES  COMPREHENSIVE METABOLIC PANEL   Procedures Procedures  Medications Ordered in ED Medications  0.45 % sodium chloride infusion (has no administration in time range)  HYDROmorphone (DILAUDID) injection 2 mg (has no administration in time range)    Or  HYDROmorphone (DILAUDID) injection 2 mg (has no administration in time range)  HYDROmorphone (DILAUDID) injection 2 mg (has no administration in time range)    Or  HYDROmorphone (DILAUDID) injection 2 mg (has no administration in time range)  HYDROmorphone (DILAUDID) injection 2 mg (has no administration in time range)    Or  HYDROmorphone (DILAUDID) injection 2 mg (has no administration in time range)  HYDROmorphone (DILAUDID) injection 2 mg (has no administration in  time range)    Or  HYDROmorphone (DILAUDID) injection 2 mg (has no administration in time range)  diphenhydrAMINE (BENADRYL) injection 25 mg (has no administration in time range)  ondansetron (ZOFRAN) injection 4 mg (has no administration in time range)     Initial Impression / Assessment and Plan / ED Course  I have reviewed the triage vital signs and the nursing notes.  Pertinent lab results that were available during my care of the patient were reviewed by me and considered in my medical decision making (see chart for details).  Sickle cell vaso-occlusive crisis.  No red flags to suggest more serious pathology.  Old records are reviewed, and he has multiple ED visits and hospitalizations for sickle cell disease.  He had an ED visit on June 7, was admitted on June 5, ED visit on May 31.  Patient states  that he did get relief with each of those visits, but pain just kept recurring.  He is started on sickle cell protocol.  He has received 2 doses of hydromorphone, and is still complaining of significant pain.  He will be given a third dose of hydromorphone.  Case is signed out to Dr. Ashok Cordia, oncoming physician.  Final Clinical Impressions(s) / ED Diagnoses   Final diagnoses:  Sickle cell pain crisis [facility])    ED Discharge Orders    None       Delora Fuel, MD 14/64/31 [phone]

## 2018-07-24 NOTE — ED Triage Notes (Signed)
Pt complains of sickle cell pain in his back for two days

## 2018-07-24 NOTE — ED Provider Notes (Signed)
Signed out by Dr Roxanne Mins that pt was receiving a 3rd dose of medicine, and then to set disposition.  Recheck pt - pt appears comfortable. No acute distress. On phone, tolerating po fluids.   Indicates is followed by sickle cell clinic next door - can f/u there closely.   No fevers. Vital signs normal.   No chest pain or discomfort. No cough.   hgb c/w baseline.  Pt appears stable for d/c.   Return precautions provided.    Lajean Saver, MD 07/24/18 [phone]

## 2018-07-25 ENCOUNTER — Other Ambulatory Visit: Payer: Self-pay

## 2018-07-25 ENCOUNTER — Telehealth: Payer: Self-pay

## 2018-07-25 DIAGNOSIS — M545 Low back pain: Secondary | ICD-10-CM | POA: Insufficient documentation

## 2018-07-25 DIAGNOSIS — Z79899 Other long term (current) drug therapy: Secondary | ICD-10-CM | POA: Insufficient documentation

## 2018-07-25 DIAGNOSIS — Z20828 Contact with and (suspected) exposure to other viral communicable diseases: Secondary | ICD-10-CM | POA: Insufficient documentation

## 2018-07-25 DIAGNOSIS — Z7982 Long term (current) use of aspirin: Secondary | ICD-10-CM | POA: Insufficient documentation

## 2018-07-25 DIAGNOSIS — D5700 Hb-SS disease with crisis, unspecified: Secondary | ICD-10-CM | POA: Insufficient documentation

## 2018-07-25 NOTE — Telephone Encounter (Signed)
Called to do COVID -19 Screening. Patient preferred to have visit via telephone. Thanks!

## 2018-07-26 ENCOUNTER — Inpatient Hospital Stay (HOSPITAL_COMMUNITY)
Admission: AD | Admit: 2018-07-26 | Discharge: 2018-07-31 | DRG: 812 | Disposition: A | Discharge status: Home / Self Care | Payer: Medicaid Other | Source: Ambulatory Visit | Attending: Internal Medicine | Admitting: Internal Medicine

## 2018-07-26 ENCOUNTER — Encounter (HOSPITAL_COMMUNITY): Payer: Self-pay | Admitting: Emergency Medicine

## 2018-07-26 ENCOUNTER — Emergency Department (HOSPITAL_COMMUNITY)
Admission: EM | Admit: 2018-07-26 | Discharge: 2018-07-26 | Disposition: A | Discharge status: Home / Self Care | Payer: Medicaid Other | Attending: Internal Medicine | Admitting: Internal Medicine

## 2018-07-26 ENCOUNTER — Encounter (HOSPITAL_COMMUNITY): Payer: Self-pay | Admitting: General Practice

## 2018-07-26 ENCOUNTER — Ambulatory Visit: Payer: Self-pay | Admitting: Family Medicine

## 2018-07-26 ENCOUNTER — Other Ambulatory Visit: Payer: Self-pay

## 2018-07-26 DIAGNOSIS — Z791 Long term (current) use of non-steroidal anti-inflammatories (NSAID): Secondary | ICD-10-CM

## 2018-07-26 DIAGNOSIS — D649 Anemia, unspecified: Secondary | ICD-10-CM | POA: Diagnosis present

## 2018-07-26 DIAGNOSIS — M545 Low back pain: Secondary | ICD-10-CM | POA: Diagnosis present

## 2018-07-26 DIAGNOSIS — D72829 Elevated white blood cell count, unspecified: Secondary | ICD-10-CM | POA: Diagnosis present

## 2018-07-26 DIAGNOSIS — Z1159 Encounter for screening for other viral diseases: Secondary | ICD-10-CM

## 2018-07-26 DIAGNOSIS — Z832 Family history of diseases of the blood and blood-forming organs and certain disorders involving the immune mechanism: Secondary | ICD-10-CM

## 2018-07-26 DIAGNOSIS — D638 Anemia in other chronic diseases classified elsewhere: Secondary | ICD-10-CM | POA: Diagnosis present

## 2018-07-26 DIAGNOSIS — Z87438 Personal history of other diseases of male genital organs: Secondary | ICD-10-CM

## 2018-07-26 DIAGNOSIS — F1120 Opioid dependence, uncomplicated: Secondary | ICD-10-CM | POA: Diagnosis present

## 2018-07-26 DIAGNOSIS — D5700 Hb-SS disease with crisis, unspecified: Secondary | ICD-10-CM

## 2018-07-26 DIAGNOSIS — G894 Chronic pain syndrome: Secondary | ICD-10-CM | POA: Diagnosis present

## 2018-07-26 DIAGNOSIS — Z79899 Other long term (current) drug therapy: Secondary | ICD-10-CM

## 2018-07-26 DIAGNOSIS — D571 Sickle-cell disease without crisis: Secondary | ICD-10-CM

## 2018-07-26 DIAGNOSIS — G8929 Other chronic pain: Secondary | ICD-10-CM | POA: Diagnosis not present

## 2018-07-26 DIAGNOSIS — Z7982 Long term (current) use of aspirin: Secondary | ICD-10-CM | POA: Diagnosis not present

## 2018-07-26 DIAGNOSIS — F4325 Adjustment disorder with mixed disturbance of emotions and conduct: Secondary | ICD-10-CM | POA: Diagnosis present

## 2018-07-26 DIAGNOSIS — F332 Major depressive disorder, recurrent severe without psychotic features: Secondary | ICD-10-CM | POA: Diagnosis present

## 2018-07-26 DIAGNOSIS — M79606 Pain in leg, unspecified: Secondary | ICD-10-CM | POA: Diagnosis present

## 2018-07-26 DIAGNOSIS — F919 Conduct disorder, unspecified: Secondary | ICD-10-CM | POA: Diagnosis present

## 2018-07-26 DIAGNOSIS — E559 Vitamin D deficiency, unspecified: Secondary | ICD-10-CM | POA: Diagnosis present

## 2018-07-26 DIAGNOSIS — R4689 Other symptoms and signs involving appearance and behavior: Secondary | ICD-10-CM

## 2018-07-26 LAB — URINALYSIS, ROUTINE W REFLEX MICROSCOPIC
Bilirubin Urine: NEGATIVE
Glucose, UA: NEGATIVE mg/dL
Ketones, ur: NEGATIVE mg/dL
Nitrite: NEGATIVE
Protein, ur: NEGATIVE mg/dL
Specific Gravity, Urine: 1.005 (ref 1.005–1.030)
pH: 6 (ref 5.0–8.0)

## 2018-07-26 LAB — CBC WITH DIFFERENTIAL/PLATELET
Abs Immature Granulocytes: 0.05 10*3/uL (ref 0.00–0.07)
Basophils Absolute: 0 10*3/uL (ref 0.0–0.1)
Basophils Relative: 0 %
Eosinophils Absolute: 0.4 10*3/uL (ref 0.0–0.5)
Eosinophils Relative: 3 %
HCT: 25.7 % — ABNORMAL LOW (ref 39.0–52.0)
Hemoglobin: 8.4 g/dL — ABNORMAL LOW (ref 13.0–17.0)
Immature Granulocytes: 0 %
Lymphocytes Relative: 29 %
Lymphs Abs: 3.4 10*3/uL (ref 0.7–4.0)
MCH: 28.4 pg (ref 26.0–34.0)
MCHC: 32.7 g/dL (ref 30.0–36.0)
MCV: 86.8 fL (ref 80.0–100.0)
Monocytes Absolute: 1 10*3/uL (ref 0.1–1.0)
Monocytes Relative: 8 %
Neutro Abs: 7.1 10*3/uL (ref 1.7–7.7)
Neutrophils Relative %: 60 %
Platelets: 452 10*3/uL — ABNORMAL HIGH (ref 150–400)
RBC: 2.96 MIL/uL — ABNORMAL LOW (ref 4.22–5.81)
RDW: 23.1 % — ABNORMAL HIGH (ref 11.5–15.5)
WBC: 12 10*3/uL — ABNORMAL HIGH (ref 4.0–10.5)
nRBC: 1.7 % — ABNORMAL HIGH (ref 0.0–0.2)

## 2018-07-26 LAB — COMPREHENSIVE METABOLIC PANEL
ALT: 9 U/L (ref 0–44)
AST: 19 U/L (ref 15–41)
Albumin: 4.5 g/dL (ref 3.5–5.0)
Alkaline Phosphatase: 77 U/L (ref 38–126)
Anion gap: 9 (ref 5–15)
BUN: 6 mg/dL (ref 6–20)
CO2: 26 mmol/L (ref 22–32)
Calcium: 9.2 mg/dL (ref 8.9–10.3)
Chloride: 102 mmol/L (ref 98–111)
Creatinine, Ser: 0.53 mg/dL — ABNORMAL LOW (ref 0.61–1.24)
GFR calc Af Amer: >60 mL/min (ref >60)
GFR calc non Af Amer: >60 mL/min (ref >60)
Glucose, Bld: 91 mg/dL (ref 70–99)
Potassium: 4 mmol/L (ref 3.5–5.1)
Sodium: 137 mmol/L (ref 135–145)
Total Bilirubin: 2.9 mg/dL — ABNORMAL HIGH (ref 0.3–1.2)
Total Protein: 8.1 g/dL (ref 6.5–8.1)

## 2018-07-26 LAB — RETICULOCYTES
Immature Retic Fract: 39.7 % — ABNORMAL HIGH (ref 2.3–15.9)
RBC.: 2.96 MIL/uL — ABNORMAL LOW (ref 4.22–5.81)
Retic Count, Absolute: 314.9 10*3/uL — ABNORMAL HIGH (ref 19.0–186.0)
Retic Ct Pct: 10.6 % — ABNORMAL HIGH (ref 0.4–3.1)

## 2018-07-26 MED ORDER — HYDROMORPHONE 1 MG/ML IV SOLN
INTRAVENOUS | Status: DC
  Administered 2018-07-26: 11.5 mg via INTRAVENOUS
  Administered 2018-07-26: 30 mg via INTRAVENOUS
  Administered 2018-07-26: 4 mg via INTRAVENOUS
  Administered 2018-07-27: 3 mg via INTRAVENOUS
  Administered 2018-07-27: 5 mg via INTRAVENOUS
  Administered 2018-07-27: 3 mg via INTRAVENOUS
  Administered 2018-07-27: 2 mg via INTRAVENOUS
  Administered 2018-07-27: 7 mg via INTRAVENOUS
  Administered 2018-07-27: 12 mg via INTRAVENOUS
  Administered 2018-07-28: 30 mg via INTRAVENOUS
  Administered 2018-07-28: 3.5 mg via INTRAVENOUS
  Administered 2018-07-28: 9 mg via INTRAVENOUS
  Administered 2018-07-28: 6 mg via INTRAVENOUS
  Administered 2018-07-28: 5 mg via INTRAVENOUS
  Administered 2018-07-28: 3 mg via INTRAVENOUS
  Administered 2018-07-28: 10.5 mg via INTRAVENOUS
  Administered 2018-07-28: 30 mg via INTRAVENOUS
  Administered 2018-07-29: 6.5 mg via INTRAVENOUS
  Administered 2018-07-29: 4.5 mg via INTRAVENOUS
  Administered 2018-07-29: 5.5 mg via INTRAVENOUS
  Administered 2018-07-29: 30 mg via INTRAVENOUS
  Administered 2018-07-29: 3.5 mg via INTRAVENOUS
  Administered 2018-07-29: 4.5 mg via INTRAVENOUS
  Administered 2018-07-30: 1 mg via INTRAVENOUS
  Administered 2018-07-30: 6.5 mg via INTRAVENOUS
  Administered 2018-07-30: 5 mg via INTRAVENOUS
  Administered 2018-07-30: 30 mg via INTRAVENOUS
  Filled 2018-07-26 (×7): qty 30

## 2018-07-26 MED ORDER — DIPHENHYDRAMINE HCL 25 MG PO CAPS
25.0000 mg | ORAL_CAPSULE | ORAL | Status: DC | PRN
  Administered 2018-07-26 – 2018-07-27 (×3): 25 mg via ORAL
  Filled 2018-07-26 (×3): qty 1

## 2018-07-26 MED ORDER — ASPIRIN EC 81 MG PO TBEC
81.0000 mg | DELAYED_RELEASE_TABLET | Freq: Every day | ORAL | Status: DC
  Administered 2018-07-26 – 2018-07-31 (×6): 81 mg via ORAL
  Filled 2018-07-26 (×5): qty 1

## 2018-07-26 MED ORDER — NALOXONE HCL 0.4 MG/ML IJ SOLN: 0.4000 mg | INTRAMUSCULAR | Status: DC | PRN

## 2018-07-26 MED ORDER — FOLIC ACID 1 MG PO TABS
1.0000 mg | ORAL_TABLET | Freq: Every day | ORAL | Status: DC
  Administered 2018-07-27 – 2018-07-31 (×5): 1 mg via ORAL
  Filled 2018-07-26 (×5): qty 1

## 2018-07-26 MED ORDER — HYDROMORPHONE HCL 2 MG/ML IJ SOLN
2.0000 mg | INTRAMUSCULAR | Status: AC
  Administered 2018-07-26: 2 mg via SUBCUTANEOUS
  Filled 2018-07-26: qty 1

## 2018-07-26 MED ORDER — SODIUM CHLORIDE 0.45 % IV SOLN
INTRAVENOUS | Status: DC
  Administered 2018-07-26 – 2018-07-31 (×3): via INTRAVENOUS

## 2018-07-26 MED ORDER — SODIUM CHLORIDE 0.45 % IV SOLN
INTRAVENOUS | Status: DC
  Administered 2018-07-26: 06:00:00 via INTRAVENOUS

## 2018-07-26 MED ORDER — VITAMIN D3 25 MCG (1000 UNIT) PO TABS
2000.0000 [IU] | ORAL_TABLET | Freq: Every day | ORAL | Status: DC
  Administered 2018-07-27 – 2018-07-31 (×5): 2000 [IU] via ORAL
  Filled 2018-07-26 (×5): qty 2

## 2018-07-26 MED ORDER — KETOROLAC TROMETHAMINE 15 MG/ML IJ SOLN
15.0000 mg | Freq: Four times a day (QID) | INTRAMUSCULAR | Status: DC
  Administered 2018-07-27 – 2018-07-29 (×11): 15 mg via INTRAVENOUS
  Filled 2018-07-26 (×13): qty 1

## 2018-07-26 MED ORDER — HYDROXYUREA 500 MG PO CAPS
500.0000 mg | ORAL_CAPSULE | Freq: Two times a day (BID) | ORAL | Status: DC
  Administered 2018-07-26 – 2018-07-28 (×5): 500 mg via ORAL
  Filled 2018-07-26 (×6): qty 1

## 2018-07-26 MED ORDER — GABAPENTIN 300 MG PO CAPS
300.0000 mg | ORAL_CAPSULE | Freq: Three times a day (TID) | ORAL | Status: DC
  Administered 2018-07-26 – 2018-07-31 (×13): 300 mg via ORAL
  Filled 2018-07-26 (×12): qty 1

## 2018-07-26 MED ORDER — ONDANSETRON HCL 4 MG/2ML IJ SOLN
4.0000 mg | Freq: Four times a day (QID) | INTRAMUSCULAR | Status: DC | PRN
  Administered 2018-07-26 – 2018-07-29 (×4): 4 mg via INTRAVENOUS
  Filled 2018-07-26 (×5): qty 2

## 2018-07-26 MED ORDER — KETOROLAC TROMETHAMINE 30 MG/ML IJ SOLN: 30.0000 mg | INTRAMUSCULAR | Status: DC

## 2018-07-26 MED ORDER — SODIUM CHLORIDE 0.9% FLUSH: 9.0000 mL | INTRAVENOUS | Status: DC | PRN

## 2018-07-26 MED ORDER — SODIUM CHLORIDE 0.9% FLUSH: 3.0000 mL | Freq: Once | INTRAVENOUS | Status: DC

## 2018-07-26 MED ORDER — OXYCODONE HCL ER 20 MG PO T12A
20.0000 mg | EXTENDED_RELEASE_TABLET | Freq: Two times a day (BID) | ORAL | Status: DC
  Administered 2018-07-26 – 2018-07-31 (×10): 20 mg via ORAL
  Filled 2018-07-26 (×10): qty 1

## 2018-07-26 MED ORDER — KETOROLAC TROMETHAMINE 30 MG/ML IJ SOLN
30.0000 mg | INTRAMUSCULAR | Status: AC
  Administered 2018-07-26: 30 mg via INTRAMUSCULAR
  Filled 2018-07-26: qty 1

## 2018-07-26 MED ORDER — DIPHENHYDRAMINE HCL 25 MG PO CAPS
25.0000 mg | ORAL_CAPSULE | ORAL | Status: DC | PRN
  Administered 2018-07-26: 50 mg via ORAL
  Filled 2018-07-26: qty 2

## 2018-07-26 MED ORDER — ACETAMINOPHEN 325 MG PO TABS: 650.0000 mg | ORAL_TABLET | Freq: Four times a day (QID) | ORAL | Status: DC | PRN

## 2018-07-26 NOTE — H&P (Signed)
H&P  Patient Demographics:  Timothy Marks, is a 28 y.o. male  MRN: [id number]   DOB - 05/13/1990  Admit Date - 07/26/2018  Outpatient Primary MD for the patient is Lanae Boast, FNP      HPI:   Timothy Marks  is a 28 y.o. male with a medical history significant for sickle cell disease, type SS, chronic pain syndrome, opiate dependence, history of priapism, and history of adjustment disorder presented to sickle cell day infusion center complaining of pain primarily to low back and lower extremities that is consistent with his typical sickle cell crisis.  Prior to transitioning to clinic, patient was treated and evaluated in the ER this a.m.  He states that pain intensity has been increased over the past week and has been uncontrolled with OxyContin and oxycodone.  Current pain intensity is 8/10 characterized as constant, throbbing, and occasionally sharp. Patient denies headache, dizziness, chest pain, sore throat, persistent cough, nausea, vomiting, or diarrhea.  He also denies fever, chills, sick contacts, recent travel, or exposure to COVID-19.  Sickle cell day infusion center course: Hemoglobin 8.4, which is consistent with patient's baseline.  WBCs 12.0, platelets 452, creatinine 0.53, and total bilirubin 2.9.  Blood pressure 117/71, pulse 83, patient afebrile, and maintaining oxygen saturation at 100% on RA.  Pain persists treatment with Dilaudid, IV Toradol, Tylenol by mouth, patient admitted to [facility] for sickle cell pain crisis.    Review of systems:  In addition to the HPI above, patient reports Review of Systems  Constitutional: Negative for chills and fever.  HENT: Negative.   Eyes: Negative for blurred vision and double vision.  Respiratory: Negative for sputum production and shortness of breath.   Cardiovascular: Negative for chest pain and palpitations.  Gastrointestinal: Negative for abdominal pain, nausea and vomiting.  Genitourinary: Negative.   Musculoskeletal:  Positive for back pain and joint pain.  Neurological: Negative.  Negative for dizziness and headaches.  Psychiatric/Behavioral: Negative.  Negative for depression and suicidal ideas.    A full 10 point Review of Systems was done, except as stated above, all other Review of Systems were negative.  With Past History of the following :   Past Medical History:  Diagnosis Date  . Depression   . 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 Sickle cell anemia (HCC)   . Vitamin D deficiency y-6      Past Surgical History:  Procedure Laterality Date  . CHOLECYSTECTOMY       Social History:   Social History   Tobacco Use  . Smoking status: Never Smoker  . Smokeless tobacco: Never Used  Substance Use Topics  . Alcohol use: No     Lives - At home   Family History :   Family History  Problem Relation Age of Onset  . Sickle cell trait Mother   . Sickle cell trait Father      Home Medications:   Prior to Admission medications   Medication Sig Start Date End Date Taking? Authorizing Provider  aspirin EC 81 MG tablet Take 1 tablet (81 mg total) by mouth daily. 06/02/17  Yes Dorena Dew, FNP  cholecalciferol (VITAMIN D) 1000 units tablet Take 2 tablets (2,000 Units total) by mouth daily. 12/13/17  Yes Azzie Glatter, FNP  folic acid (FOLVITE) 1 MG tablet Take 1 tablet (1 mg total) by mouth daily. 05/01/17  Yes Dorena Dew, FNP  gabapentin (NEURONTIN) 300 MG capsule  TAKE 1 CAPSULE(300 MG) BY MOUTH THREE TIMES DAILY Patient taking differently: Take 300 mg by mouth 3 (three) times daily.  05/08/18  Yes Lanae Boast, FNP  hydroxyurea (HYDREA) 500 MG capsule Take 1 capsule (500 mg total) by mouth 2 (two) times daily. May take with food to minimize GI side effects. 05/01/17  Yes Dorena Dew, FNP  ibuprofen (ADVIL,MOTRIN) 800 MG tablet Take 1 tablet (800 mg total) by mouth 3 (three) times daily as needed (pain).  12/20/17  Yes Azzie Glatter, FNP  oxyCODONE (OXYCONTIN) 20 mg 12 hr tablet Take 1 tablet (20 mg total) by mouth every 12 (twelve) hours for 30 days. 06/28/18 07/28/18 Yes Lanae Boast, FNP  oxyCODONE-acetaminophen (PERCOCET) 10-325 MG tablet Take 1 tablet by mouth every 6 (six) hours as needed for up to 15 days for pain. 07/13/18 07/28/18 Yes Lanae Boast, FNP     Allergies:   Allergies  Allergen Reactions  . Buprenorphine Hcl Hives  . Levaquin [Levofloxacin] Itching  . Meperidine Rash  . Morphine And Related Hives  . Toradol [Ketorolac Tromethamine] Itching  . Tramadol Hives  . Vancomycin Itching  . Zosyn [Piperacillin Sod-Tazobactam So] Itching and Rash    Has taken rocephin in past     Physical Exam:   Vitals:   Vitals:   07/26/18 0942 07/26/18 1120  BP: (!) 107/58 (!) (P) 97/48  Pulse: 60 (P) 67  Resp: 17 (P) 11  Temp: 97.6 F (36.4 C)   SpO2: 99% (P) 96%   Physical Exam HENT:     Mouth/Throat:     Mouth: Mucous membranes are moist.  Eyes:     Pupils: Pupils are equal, round, and reactive to light.  Cardiovascular:     Rate and Rhythm: Normal rate and regular rhythm.     Pulses: Normal pulses.     Heart sounds: Normal heart sounds.  Pulmonary:     Effort: Pulmonary effort is normal.     Breath sounds: Normal breath sounds.  Abdominal:     General: Abdomen is flat. Bowel sounds are normal.  Musculoskeletal: Normal range of motion.  Skin:    General: Skin is warm.  Neurological:     General: No focal deficit present.     Mental Status: Mental status is at baseline.  Psychiatric:        Mood and Affect: Mood normal.        Behavior: Behavior is agitated.        Thought Content: Thought content normal.        Judgment: Judgment normal.       Data Review:   CBC Recent Labs  Lab 07/20/18 1313 07/22/18 1844 07/24/18 0449 07/26/18 0355  WBC 7.8 11.2* 13.3* 12.0*  HGB 9.3* 8.1* 9.8* 8.4*  HCT 27.9* 24.3* 28.6* 25.7*  PLT 498* 482* 532* 452*   MCV 83.3 83.8 85.6 86.8  MCH 27.8 27.9 29.3 28.4  MCHC 33.3 33.3 34.3 32.7  RDW 23.1* 24.9* 25.2* 23.1*  LYMPHSABS 2.9 3.6 3.2 3.4  MONOABS 0.5 0.9 1.0 1.0  EOSABS 0.2 0.2 0.6* 0.4  BASOSABS 0.0 0.0 0.0 0.0   ------------------------------------------------------------------------------------------------------------------  Chemistries  Recent Labs  Lab 07/20/18 1313 07/22/18 1844 07/24/18 0449 07/26/18 0240  NA 139 138 140 137  K 3.3* 3.0* 3.4* 4.0  CL 107 102 106 102  CO2 26 26 24 26   GLUCOSE 89 98 89 91  BUN 6 5* 5* 6  CREATININE 0.60* 0.70 0.64 0.53*  CALCIUM 9.0  9.0 9.2 9.2  AST 20 21 24 19   ALT 8 9 12 9   ALKPHOS 70 71 78 77  BILITOT 2.4* 2.7* 3.3* 2.9*   ------------------------------------------------------------------------------------------------------------------ estimated creatinine clearance is 117.9 mL/min (A) (by C-G formula based on SCr of 0.53 mg/dL (L)). ------------------------------------------------------------------------------------------------------------------ No results for input(s): TSH, T4TOTAL, T3FREE, THYROIDAB in the last 72 hours.  Invalid input(s): FREET3  Coagulation profile Recent Labs  Lab 07/24/18 0449  INR 1.1   ------------------------------------------------------------------------------------------------------------------- No results for input(s): DDIMER in the last 72 hours. -------------------------------------------------------------------------------------------------------------------  Cardiac Enzymes No results for input(s): CKMB, TROPONINI, MYOGLOBIN in the last 168 hours.  Invalid input(s): CK ------------------------------------------------------------------------------------------------------------------ No results found for: BNP  ---------------------------------------------------------------------------------------------------------------  Urinalysis    Component Value Date/Time   COLORURINE YELLOW  03/18/2018 [place] 03/18/2018 1131   LABSPEC 1.012 03/18/2018 1131   PHURINE 7.0 03/18/2018 1131   GLUCOSEU NEGATIVE 03/18/2018 1131   HGBUR SMALL (A) 03/18/2018 1131   BILIRUBINUR neg 06/28/2018 0841   KETONESUR negative 05/03/2018 0837   KETONESUR NEGATIVE 03/18/2018 1131   PROTEINUR Positive (A) 06/28/2018 0841   PROTEINUR NEGATIVE 03/18/2018 1131   UROBILINOGEN 1.0 06/28/2018 0841   UROBILINOGEN 2.0 (H) 05/01/2017 0955   NITRITE neg 06/28/2018 0841   NITRITE NEGATIVE 03/18/2018 1131   LEUKOCYTESUR Negative 06/28/2018 0841    ----------------------------------------------------------------------------------------------------------------   Imaging Results:    No results found.    Assessment & Plan:  Active Problems:   Major depressive disorder, recurrent, severe without psychotic features (HCC)   Sickle cell crisis ([place])   Other chronic pain   Anemia of chronic disease   Sickle cell anemia with pain crisis:  Admit, start IVF 0 .45% Saline @100  mL/h Continue weight-based Dilaudid PCA with settings of 0.5 mg, 10-minute lockout, and 3 mg/h IV Toradol 15 mg every 6 hours Maintain oxygen saturation above 90%, 2 L supplemental oxygen as needed Reevaluate pain scale regularly.  Patient will be reevaluated for pain in the context of functioning and relationship to baseline as care progresses.  Leukocytosis: WBCs 12.0, which is improved from previous.  No signs of infection or inflammation.  Suspected to be reactive.  Continue to monitor closely. Repeat CBC in a.m.  Sickle cell anemia: Hemoglobin 8.4, consistent with patient's confusion at this time.  Continue to follow CBC  Chronic pain syndrome: Continue OxyContin 20 mg every 12 hours Hold Percocet 10-3 25, use PCA Dilaudid as substitute. Continue gabapentin 300 mg 3 times daily.  Major depressive disorder: Stable.  In remission.  Patient not currently on medications and not followed by outpatient  psychiatry.  He currently denies suicidal or homicidal ideations.  DVT Prophylaxis: SCDs  AM Labs Ordered, also please review Full Orders  Family Communication: Admission, patient's condition and plan of care including tests being ordered have been discussed with the patient who indicate understanding and agree with the plan and Code Status.  Code Status: Full Code  Consults called: None    Admission status: Inpatient    Time spent in minutes : 50 minutes  [place], MSN, FNP-C [facility] [address] [place], [place] [address] [phone]  07/26/2018 at 12:08 PM

## 2018-07-26 NOTE — ED Triage Notes (Signed)
Patient complaining of sickle cell crisis. He states his lower back is in pain.

## 2018-07-26 NOTE — ED Provider Notes (Signed)
[facility] DEPT Provider Note   CSN: [id number] Arrival date & time: 07/25/18  2346    History   Chief Complaint Chief Complaint  Patient presents with  . Sickle Cell Pain Crisis    HPI Timothy Marks is a 28 y.o. male.     HPI   Pt is a 28 y/o male with a h/o sickle cell anemia, depression, osteonecrosis or the left shoulder who presents to the ED for eval of sickle cell crisis. States that he began to have lower back pain a few days ago. Pain rated 10/10. Pain is constant. Pain feels consistent with his prior sickle cell crisis. He has tried oxycontin and percocet without relief at home. No fevers, cough, chest pain, sob. Pt denies any numbness/tingling/weakness to the BLE. Denies saddle anesthesia. Denies loss of control of bowels or bladder. No urinary retention. No fevers. Denies a h/o IVDU. Denies a h/o CA or recent unintended weight loss.   Past Medical History:  Diagnosis Date  . Depression   . 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 Sickle cell anemia (HCC)   . Vitamin D deficiency y-6    Patient Active Problem List   Diagnosis Date Noted  . Sickle cell anemia with crisis ([place]) 05/22/2018  . Acute bilateral low back pain without sciatica   . Other chronic pain   . Anemia of chronic disease   . 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 Physical deconditioning   . Agitation   . Chronic pain syndrome   . Recurrent cold sores   . Fever   . Pain   . Sickle cell crisis ([place]) 07/16/2017  . Adjustment disorder with mixed disturbance of emotions and conduct   . Constipation 08/03/2014  . 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 Protein-calorie malnutrition, severe ([place]) 04/09/2014  . Major depressive disorder, recurrent, severe without psychotic features ([place])   . 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 Vitamin D deficiency   . Sickle cell anemia ([place]) 03/30/2014  .  Hyperbilirubinemia 03/30/2014  . Hypokalemia 02/07/2014  . Aggressive behavior 01/25/2013  . Sickle cell pain crisis ([place]) 01/24/2013    Past Surgical History:  Procedure Laterality Date  . CHOLECYSTECTOMY        Home Medications    Prior to Admission medications   Medication Sig Start Date End Date Taking? Authorizing Provider  aspirin EC 81 MG tablet Take 1 tablet (81 mg total) by mouth daily. 06/02/17  Yes Dorena Dew, FNP  cholecalciferol (VITAMIN D) 1000 units tablet Take 2 tablets (2,000 Units total) by mouth daily. 12/13/17  Yes Azzie Glatter, FNP  folic acid (FOLVITE) 1 MG tablet Take 1 tablet (1 mg total) by mouth daily. 05/01/17  Yes Dorena Dew, FNP  gabapentin (NEURONTIN) 300 MG capsule TAKE 1 CAPSULE(300 MG) BY MOUTH THREE TIMES DAILY Patient taking differently: Take 300 mg by mouth 3 (three) times daily.  05/08/18  Yes Lanae Boast, FNP  hydroxyurea (HYDREA) 500 MG capsule Take 1 capsule (500 mg total) by mouth 2 (two) times daily. May take with food to minimize GI side effects. 05/01/17  Yes Dorena Dew, FNP  ibuprofen (ADVIL,MOTRIN) 800 MG tablet Take 1 tablet (800 mg total) by mouth 3 (three) times daily as needed (pain). 12/20/17  Yes Azzie Glatter, FNP  oxyCODONE (OXYCONTIN) 20 mg 12 hr tablet Take 1 tablet (20 mg total) by mouth every 12 (  twelve) hours for 30 days. 06/28/18 07/28/18 Yes Lanae Boast, FNP  oxyCODONE-acetaminophen (PERCOCET) 10-325 MG tablet Take 1 tablet by mouth every 6 (six) hours as needed for up to 15 days for pain. 07/13/18 07/28/18 Yes Lanae Boast, FNP    Family History Family History  Problem Relation Age of Onset  . Sickle cell trait Mother   . Sickle cell trait Father     Social History Social History   Tobacco Use  . Smoking status: Never Smoker  . Smokeless tobacco: Never Used  Substance Use Topics  . Alcohol use: No  . Drug use: Not Currently    Types: Marijuana     Allergies   Buprenorphine hcl,  Levaquin [levofloxacin], Meperidine, Morphine and related, Toradol [ketorolac tromethamine], Tramadol, Vancomycin, and Zosyn [piperacillin sod-tazobactam so]   Review of Systems Review of Systems  Constitutional: Negative for chills and fever.  HENT: Negative for ear pain and sore throat.   Eyes: Negative for visual disturbance.  Respiratory: Negative for cough and shortness of breath.   Cardiovascular: Negative for chest pain.  Gastrointestinal: Negative for abdominal pain, constipation, diarrhea, nausea and vomiting.  Genitourinary: Negative for dysuria and hematuria.  Musculoskeletal: Positive for back pain.  Skin: Negative for rash.  Neurological: Negative for weakness and numbness.  All other systems reviewed and are negative.    Physical Exam Updated Vital Signs BP (!) 103/52 (BP Location: Left Arm)   Pulse 70   Temp 98.9 F (37.2 C) (Oral)   Resp 15   SpO2 95%   Physical Exam Vitals signs and nursing note reviewed.  Constitutional:      Appearance: He is well-developed.  HENT:     Head: Normocephalic and atraumatic.  Eyes:     Conjunctiva/sclera: Conjunctivae normal.  Neck:     Musculoskeletal: Neck supple.  Cardiovascular:     Rate and Rhythm: Normal rate and regular rhythm.     Pulses: Normal pulses.     Heart sounds: Normal heart sounds. No murmur.  Pulmonary:     Effort: Pulmonary effort is normal. No respiratory distress.     Breath sounds: Normal breath sounds. No wheezing, rhonchi or rales.  Abdominal:     General: Bowel sounds are normal.     Palpations: Abdomen is soft.     Tenderness: There is no abdominal tenderness. There is no guarding or rebound.  Musculoskeletal:     Comments: TTP to the lower thoracic, upper lumbar spine. No erythema or warmth.  Skin:    General: Skin is warm and dry.  Neurological:     Mental Status: He is alert.    ED Treatments / Results  Labs (all labs ordered are listed, but only abnormal results are displayed)  Labs Reviewed  COMPREHENSIVE METABOLIC PANEL - Abnormal; Notable for the following components:      Result Value   Creatinine, Ser 0.53 (*)    Total Bilirubin 2.9 (*)    All other components within normal limits  CBC WITH DIFFERENTIAL/PLATELET - Abnormal; Notable for the following components:   WBC 12.0 (*)    RBC 2.96 (*)    Hemoglobin 8.4 (*)    HCT 25.7 (*)    RDW 23.1 (*)    Platelets 452 (*)    nRBC 1.7 (*)    All other components within normal limits  RETICULOCYTES - Abnormal; Notable for the following components:   Retic Ct Pct 10.6 (*)    RBC. 2.96 (*)    Retic Count, Absolute  314.9 (*)    Immature Retic Fract 39.7 (*)    All other components within normal limits  NOVEL CORONAVIRUS, NAA (HOSPITAL ORDER, SEND-OUT TO REF LAB)  CBC WITH DIFFERENTIAL/PLATELET  CBC WITH DIFFERENTIAL/PLATELET    EKG None  Radiology No results found.  Procedures Procedures (including critical care time)  Medications Ordered in ED Medications  sodium chloride flush (NS) 0.9 % injection 3 mL (3 mLs Intravenous Not Given 07/26/18 0715)  diphenhydrAMINE (BENADRYL) capsule 25-50 mg (50 mg Oral Given 07/26/18 0406)  0.45 % sodium chloride infusion ( Intravenous New Bag/Given 07/26/18 0549)  HYDROmorphone (DILAUDID) injection 2 mg (2 mg Subcutaneous Given 07/26/18 0407)  HYDROmorphone (DILAUDID) injection 2 mg (2 mg Subcutaneous Given 07/26/18 0440)  HYDROmorphone (DILAUDID) injection 2 mg (2 mg Subcutaneous Given 07/26/18 0515)  ketorolac (TORADOL) 30 MG/ML injection 30 mg (30 mg Intramuscular Given 07/26/18 0409)     Initial Impression / Assessment and Plan / ED Course  I have reviewed the triage vital signs and the nursing notes.  Pertinent labs & imaging results that were available during my care of the patient were reviewed by me and considered in my medical decision making (see chart for details).   Final Clinical Impressions(s) / ED Diagnoses   Final diagnoses:  Sickle cell pain  crisis ([place])   28 year old male with a history of sickle cell anemia presenting for evaluation of sickle cell crisis that began a few days ago.  Has tried home medications without significant relief.  Complaining of lower back pain which he states is consistent with prior sickle cell crisis.  Denies any specific inciting factors to cause his pain.  CBC demonstrates a leukocytosis of 12, slightly improved from a few days ago.  Hemoglobin is 8.4, decreased from 2 days ago.  Reticulocyte count is elevated appropriately.  Patient was given IV fluids, Toradol, Benadryl and 3 doses of narcotic pain medication in the ED.  On reevaluation he appears much more comfortable but states that he is still in significant pain and thinks that he needs to be admitted to the hospital.  Will consult the hospitalist service for admission.  Dr. Doreene Burke accepts pt for admission.  ED Discharge Orders    None       Bishop Dublin 07/26/18 6440    Shanon Rosser, MD 07/26/18 2229

## 2018-07-26 NOTE — H&P (Signed)
[facility] History and Physical   Date: 07/26/2018  Patient name: Timothy Marks Medical record number: [id number] Date of birth: Aug 16, 1990 Age: 28 y.o. Gender: male PCP: Lanae Boast, [place]  Attending physician: Tresa Garter, MD  Chief Complaint: Sickle cell crisis  History of Present Illness: Timothy Marks, a 28 year old male with medical history significant for sickle cell disease, type SS, chronic pain syndrome, opiate dependence, history of priapism, and history of adjustment disorder with mixed disturbance of emotions and conduct presents complaining of low back pain and lower extremity pain that is consistent with typical sickle cell crisis.  Patient was treated and evaluated in the emergency department this a.m.  Pain persists despite treatment with IV Dilaudid, IV Toradol, and IV fluids.  Agree with ER provider that patient is appropriate to transition to sickle cell day infusion center for pain management and extended observation.  Patient states that pain has been increasing over the past several weeks and has been unresponsive to home opiate medications.  He attributes increase in pain to changes in weather.  Current pain intensity 7/8-10 characterized as constant and throbbing. Patient denies fever, chills, sick contacts, recent travel, or exposure to COVID-19.  He also denies headache, chest pain, shortness of breath, dysuria, nausea, vomiting, or diarrhea.  Meds: Medications Prior to Admission  Medication Sig Dispense Refill Last Dose  . aspirin EC 81 MG tablet Take 1 tablet (81 mg total) by mouth daily. 30 tablet 11   . cholecalciferol (VITAMIN D) 1000 units tablet Take 2 tablets (2,000 Units total) by mouth daily. 30 tablet 6   . folic acid (FOLVITE) 1 MG tablet Take 1 tablet (1 mg total) by mouth daily. 90 tablet 2   . gabapentin (NEURONTIN) 300 MG capsule TAKE 1 CAPSULE(300 MG) BY MOUTH THREE TIMES DAILY (Patient taking differently: Take 300 mg by  mouth 3 (three) times daily. ) 90 capsule 0   . hydroxyurea (HYDREA) 500 MG capsule Take 1 capsule (500 mg total) by mouth 2 (two) times daily. May take with food to minimize GI side effects. 180 capsule 2   . ibuprofen (ADVIL,MOTRIN) 800 MG tablet Take 1 tablet (800 mg total) by mouth 3 (three) times daily as needed (pain). 30 tablet 0   . oxyCODONE (OXYCONTIN) 20 mg 12 hr tablet Take 1 tablet (20 mg total) by mouth every 12 (twelve) hours for 30 days. 60 tablet 0   . oxyCODONE-acetaminophen (PERCOCET) 10-325 MG tablet Take 1 tablet by mouth every 6 (six) hours as needed for up to 15 days for pain. 60 tablet 0     Allergies: Buprenorphine hcl, Levaquin [levofloxacin], Meperidine, Morphine and related, Toradol [ketorolac tromethamine], Tramadol, Vancomycin, and Zosyn [piperacillin sod-tazobactam so] Past Medical History:  Diagnosis Date  . Depression   . 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 Sickle cell anemia (HCC)   . Vitamin D deficiency y-6   Past Surgical History:  Procedure Laterality Date  . CHOLECYSTECTOMY     Family History  Problem Relation Age of Onset  . Sickle cell trait Mother   . Sickle cell trait Father    Social History   Socioeconomic History  . Marital status: Single    Spouse name: Not on file  . Number of children: Not on file  . Years of education: Not on file  . Highest education level: Not on file  Occupational History  . Not on file  Social Needs  .  Financial resource strain: Not on file  . Food insecurity    Worry: Not on file    Inability: Not on file  . Transportation needs    Medical: Not on file    Non-medical: Not on file  Tobacco Use  . Smoking status: Never Smoker  . Smokeless tobacco: Never Used  Substance and Sexual Activity  . Alcohol use: No  . Drug use: Not Currently    Types: Marijuana  . Sexual activity: Not on file  Lifestyle  . Physical activity    Days per  week: Not on file    Minutes per session: Not on file  . Stress: Not on file  Relationships  . Social Herbalist on phone: Not on file    Gets together: Not on file    Attends religious service: Not on file    Active member of club or organization: Not on file    Attends meetings of clubs or organizations: Not on file    Relationship status: Not on file  . Intimate partner violence    Fear of current or ex partner: Not on file    Emotionally abused: Not on file    Physically abused: Not on file    Forced sexual activity: Not on file  Other Topics Concern  . Not on file  Social History Narrative  . Not on file  Review of Systems  Constitutional: Negative for fever.  Eyes: Negative.   Respiratory: Negative for sputum production and shortness of breath.   Cardiovascular: Negative for chest pain and palpitations.  Gastrointestinal: Negative.  Negative for heartburn and nausea.  Genitourinary: Negative for dysuria and urgency.  Musculoskeletal: Positive for back pain and joint pain.  Skin: Negative for itching and rash.  Neurological: Negative for dizziness and headaches.  Endo/Heme/Allergies: Negative.   Psychiatric/Behavioral: Negative.  Negative for substance abuse and suicidal ideas.    Physical Exam: There were no vitals taken for this visit. Physical Exam Constitutional:      Appearance: Normal appearance.  HENT:     Nose: Nose normal.     Mouth/Throat:     Mouth: Mucous membranes are moist.  Eyes:     Pupils: Pupils are equal, round, and reactive to light.  Neck:     Musculoskeletal: Normal range of motion.  Cardiovascular:     Rate and Rhythm: Normal rate and regular rhythm.     Pulses: Normal pulses.     Heart sounds: Normal heart sounds.  Pulmonary:     Effort: Pulmonary effort is normal.     Breath sounds: Normal breath sounds.  Abdominal:     General: Abdomen is flat. Bowel sounds are normal.  Musculoskeletal: Normal range of motion.  Skin:     General: Skin is warm.  Neurological:     General: No focal deficit present.     Mental Status: He is alert. Mental status is at baseline.  Psychiatric:        Mood and Affect: Mood normal.        Behavior: Behavior normal.        Thought Content: Thought content normal.        Judgment: Judgment normal.      Lab results: Results for orders placed or performed during the hospital encounter of 07/26/18 (from the past 24 hour(s))  Comprehensive metabolic panel     Status: Abnormal   Collection Time: 07/26/18  2:40 AM  Result Value Ref Range   Sodium  137 135 - 145 mmol/L   Potassium 4.0 3.5 - 5.1 mmol/L   Chloride 102 98 - 111 mmol/L   CO2 26 22 - 32 mmol/L   Glucose, Bld 91 70 - 99 mg/dL   BUN 6 6 - 20 mg/dL   Creatinine, Ser 0.53 (L) 0.61 - 1.24 mg/dL   Calcium 9.2 8.9 - 10.3 mg/dL   Total Protein 8.1 6.5 - 8.1 g/dL   Albumin 4.5 3.5 - 5.0 g/dL   AST 19 15 - 41 U/L   ALT 9 0 - 44 U/L   Alkaline Phosphatase 77 38 - 126 U/L   Total Bilirubin 2.9 (H) 0.3 - 1.2 mg/dL   GFR calc non Af Amer >60 >60 mL/min   GFR calc Af Amer >60 >60 mL/min   Anion gap 9 5 - 15  CBC with Differential/Platelet     Status: Abnormal   Collection Time: 07/26/18  3:55 AM  Result Value Ref Range   WBC 12.0 (H) 4.0 - 10.5 K/uL   RBC 2.96 (L) 4.22 - 5.81 MIL/uL   Hemoglobin 8.4 (L) 13.0 - 17.0 g/dL   HCT 25.7 (L) 39.0 - 52.0 %   MCV 86.8 80.0 - 100.0 fL   MCH 28.4 26.0 - 34.0 pg   MCHC 32.7 30.0 - 36.0 g/dL   RDW 23.1 (H) 11.5 - 15.5 %   Platelets 452 (H) 150 - 400 K/uL   nRBC 1.7 (H) 0.0 - 0.2 %   Neutrophils Relative % 60 %   Neutro Abs 7.1 1.7 - 7.7 K/uL   Lymphocytes Relative 29 %   Lymphs Abs 3.4 0.7 - 4.0 K/uL   Monocytes Relative 8 %   Monocytes Absolute 1.0 0.1 - 1.0 K/uL   Eosinophils Relative 3 %   Eosinophils Absolute 0.4 0.0 - 0.5 K/uL   Basophils Relative 0 %   Basophils Absolute 0.0 0.0 - 0.1 K/uL   RBC Morphology RARE NRBC'S    Immature Granulocytes 0 %   Abs Immature  Granulocytes 0.05 0.00 - 0.07 K/uL   Polychromasia PRESENT    Sickle Cells PRESENT    Target Cells PRESENT   Reticulocytes     Status: Abnormal   Collection Time: 07/26/18  3:55 AM  Result Value Ref Range   Retic Ct Pct 10.6 (H) 0.4 - 3.1 %   RBC. 2.96 (L) 4.22 - 5.81 MIL/uL   Retic Count, Absolute 314.9 (H) 19.0 - 186.0 K/uL   Immature Retic Fract 39.7 (H) 2.3 - 15.9 %    Imaging results:  No results found.   Assessment & Plan:  Patient will be admitted to the day infusion center for extended observation  Initiate 0.45% saline at 50 ml/hour for cellular rehydration  Toradol 15 mg IV times one for inflammation.  Tylenol 1000 mg times one  Start Dilaudid PCA High Concentration per weight based protocol.    Patient will be re-evaluated for pain intensity in the context of function and relationship to baseline as care progresses.  If no significant pain relief, will transfer patient to inpatient services for a higher level of care.   Reviewed all laboratory values, consistent with patient's baseline.  Labs did not warrant repeating at this time.    [name]  APRN, MSN, FNP-C [facility] [address] [place], [place] [address] [phone]  07/26/2018, 9:18 AM

## 2018-07-26 NOTE — ED Notes (Signed)
IV team attempted to stick x4 and multiple RN's attempted to sick for labs, unsuccessfully. Phlebotomy called for labs.

## 2018-07-26 NOTE — Progress Notes (Signed)
Patient admitted to the day infusion hospital from [facility] for sickle cell pain. Initially, patient reported generalized pain rated 9/10. For pain management, patient placed on Dilaudid PCA and hydrated with IV fluids. Patient had previously received sub-q Dilaudid injections and Toradol in the ED. Patient's pain level remained elevated at 8/10. Provider wrote orders for admission to inpatient unit. Report called to [place] [address]. Vital signs WNL. Patient transferred to [place] on PCA in wheelchair. Patient alert, oriented and stable at transfer.

## 2018-07-27 LAB — COMPREHENSIVE METABOLIC PANEL WITH GFR
ALT: 20 U/L (ref 0–44)
AST: 31 U/L (ref 15–41)
Albumin: 4.2 g/dL (ref 3.5–5.0)
Alkaline Phosphatase: 87 U/L (ref 38–126)
Anion gap: 12 (ref 5–15)
BUN: 14 mg/dL (ref 6–20)
CO2: 24 mmol/L (ref 22–32)
Calcium: 8.6 mg/dL — ABNORMAL LOW (ref 8.9–10.3)
Chloride: 100 mmol/L (ref 98–111)
Creatinine, Ser: 0.61 mg/dL (ref 0.61–1.24)
GFR calc Af Amer: >60 mL/min
GFR calc non Af Amer: >60 mL/min
Glucose, Bld: 99 mg/dL (ref 70–99)
Potassium: 3.8 mmol/L (ref 3.5–5.1)
Sodium: 136 mmol/L (ref 135–145)
Total Bilirubin: 7 mg/dL — ABNORMAL HIGH (ref 0.3–1.2)
Total Protein: 7.8 g/dL (ref 6.5–8.1)

## 2018-07-27 LAB — CBC WITH DIFFERENTIAL/PLATELET
Abs Immature Granulocytes: 0.11 K/uL — ABNORMAL HIGH (ref 0.00–0.07)
Basophils Absolute: 0 K/uL (ref 0.0–0.1)
Basophils Relative: 0 %
Eosinophils Absolute: 0.3 K/uL (ref 0.0–0.5)
Eosinophils Relative: 2 %
HCT: 19.3 % — ABNORMAL LOW (ref 39.0–52.0)
Hemoglobin: 6.7 g/dL — CL (ref 13.0–17.0)
Immature Granulocytes: 1 %
Lymphocytes Relative: 10 %
Lymphs Abs: 1.9 K/uL (ref 0.7–4.0)
MCH: 28.6 pg (ref 26.0–34.0)
MCHC: 34.7 g/dL (ref 30.0–36.0)
MCV: 82.5 fL (ref 80.0–100.0)
Monocytes Absolute: 1 K/uL (ref 0.1–1.0)
Monocytes Relative: 5 %
Neutro Abs: 15.1 K/uL — ABNORMAL HIGH (ref 1.7–7.7)
Neutrophils Relative %: 82 %
Platelets: 387 K/uL (ref 150–400)
RBC: 2.34 MIL/uL — ABNORMAL LOW (ref 4.22–5.81)
RDW: 24.2 % — ABNORMAL HIGH (ref 11.5–15.5)
WBC: 18.4 K/uL — ABNORMAL HIGH (ref 4.0–10.5)
nRBC: 1 % — ABNORMAL HIGH (ref 0.0–0.2)

## 2018-07-27 LAB — NOVEL CORONAVIRUS, NAA (HOSP ORDER, SEND-OUT TO REF LAB; TAT 18-24 HRS): SARS-CoV-2, NAA: NOT DETECTED

## 2018-07-27 LAB — HIV ANTIBODY (ROUTINE TESTING W REFLEX): HIV Screen 4th Generation wRfx: NONREACTIVE

## 2018-07-27 MED ORDER — HYDROMORPHONE HCL 1 MG/ML IJ SOLN
1.0000 mg | Freq: Once | INTRAMUSCULAR | Status: AC
  Administered 2018-07-27: 1 mg via INTRAVENOUS
  Filled 2018-07-27: qty 1

## 2018-07-27 MED ORDER — MENTHOL 3 MG MT LOZG
1.0000 | LOZENGE | OROMUCOSAL | Status: DC | PRN
  Filled 2018-07-27: qty 9

## 2018-07-27 NOTE — Progress Notes (Addendum)
CRITICAL VALUE ALERT  Critical Value:  HGB 6.7  Date & Time Notied:  07/26/2018 0845  Provider Notified: Bernette Redbird  Orders Received/Actions taken: He stated "OK".   No orders received

## 2018-07-27 NOTE — Progress Notes (Signed)
Received pt from another RN. Pt reports pain that is usual during his crisis, pt does not appear to be in any distress. Upon entering the room this RN went to give scheduled Tordal and pt refused stating that he is allergic and can not take it. This RN said ok I will page your MD and see what we can figure out. Pt started to ask for dilaudid and was instructed he had the PCA and could push the button for a dose, but pt stated it does not work. Pt educated on the PCA and the need to monitor CO2 and O2 while on PCA, pt became angry and started to throw things and stated, "then get the hell out of here bitch I am not wearing all that mess." NP notified, will continue to monitor.

## 2018-07-27 NOTE — Progress Notes (Signed)
Patient ID: Timothy Marks, male   DOB: 1990-12-31, 28 y.o.   MRN: [id number] Subjective:  Timothy Marks  is a 28 y.o. male with a medical history significant for sickle cell disease, type SS, chronic pain syndrome, opiate dependence, history of priapism, and history of adjustment disorder who was admitted yesterday for sickle cell pain crisis.   He is complaining of severe low back pain and pain in his lower extremities. He rates his pain at 9/10 today. He denies any fever, no chest pain, no abdominal pain, no nausea or vomiting, no diarrhea.  Objective:  Vital signs in last 24 hours:  Vitals:   07/27/18 0437 07/27/18 0613 07/27/18 0945 07/27/18 1139  BP: 128/81   119/72  Pulse: 83   90  Resp: 15 15 14 16   Temp: 98.5 F (36.9 C)   99 F (37.2 C)  TempSrc: Oral   Oral  SpO2: 90% 95%  96%  Weight:      Height:        Intake/Output from previous day:   Intake/Output Summary (Last 24 hours) at 07/27/2018 1233 Last data filed at 07/27/2018 1130 Gross per 24 hour  Intake 1105.78 ml  Output 1425 ml  Net -319.22 ml    Physical Exam: General: Alert, awake, oriented x3, in no acute distress.  HEENT: /AT PEERL, EOMI Neck: Trachea midline,  no masses, no thyromegal,y no JVD, no carotid bruit OROPHARYNX:  Moist, No exudate/ erythema/lesions.  Heart: Regular rate and rhythm, without murmurs, rubs, gallops, PMI non-displaced, no heaves or thrills on palpation.  Lungs: Clear to auscultation, no wheezing or rhonchi noted. No increased vocal fremitus resonant to percussion  Abdomen: Soft, nontender, nondistended, positive bowel sounds, no masses no hepatosplenomegaly noted..  Neuro: No focal neurological deficits noted cranial nerves II through XII grossly intact. DTRs 2+ bilaterally upper and lower extremities. Strength 5 out of 5 in bilateral upper and lower extremities. Musculoskeletal: No warm swelling or erythema around joints, no spinal tenderness noted. Psychiatric: Patient alert  and oriented x3, good insight and cognition, good recent to remote recall. Lymph node survey: No cervical axillary or inguinal lymphadenopathy noted.  Lab Results:  Basic Metabolic Panel:    Component Value Date/Time   NA 136 07/27/2018 0807   NA 138 06/28/2018 0854   K 3.8 07/27/2018 0807   CL 100 07/27/2018 0807   CO2 24 07/27/2018 0807   BUN 14 07/27/2018 0807   BUN 5 (L) 06/28/2018 0854   CREATININE 0.61 07/27/2018 0807   GLUCOSE 99 07/27/2018 0807   CALCIUM 8.6 (L) 07/27/2018 0807   CBC:    Component Value Date/Time   WBC 18.4 (H) 07/27/2018 0807   HGB 6.7 (LL) 07/27/2018 0807   HGB 13.0 06/28/2018 0854   HCT 19.3 (L) 07/27/2018 0807   HCT 39.7 06/28/2018 0854   PLT 387 07/27/2018 0807   PLT 350 06/28/2018 0854   MCV 82.5 07/27/2018 0807   MCV 83 06/28/2018 0854   NEUTROABS 15.1 (H) 07/27/2018 0807   NEUTROABS 4.3 06/28/2018 0854   LYMPHSABS 1.9 07/27/2018 0807   LYMPHSABS 2.2 06/28/2018 0854   MONOABS 1.0 07/27/2018 0807   EOSABS 0.3 07/27/2018 0807   EOSABS 0.1 06/28/2018 0854   BASOSABS 0.0 07/27/2018 0807   BASOSABS 0.0 06/28/2018 0854    Recent Results (from the past 240 hour(s))  SARS Coronavirus 2 (CEPHEID - Performed in [facility] hospital lab), Hosp Order     Status: None   Collection Time: 07/22/18  7:42  PM   Specimen: Nasopharyngeal Swab  Result Value Ref Range Status   SARS Coronavirus 2 NEGATIVE NEGATIVE Final    Comment: (NOTE) If result is NEGATIVE SARS-CoV-2 target nucleic acids are NOT DETECTED. The SARS-CoV-2 RNA is generally detectable in upper and lower  respiratory specimens during the acute phase of infection. The lowest  concentration of SARS-CoV-2 viral copies this assay can detect is 250  copies / mL. A negative result does not preclude SARS-CoV-2 infection  and should not be used as the sole basis for treatment or other  patient management decisions.  A negative result may occur with  improper specimen collection / handling,  submission of specimen other  than nasopharyngeal swab, presence of viral mutation(s) within the  areas targeted by this assay, and inadequate number of viral copies  (<250 copies / mL). A negative result must be combined with clinical  observations, patient history, and epidemiological information. If result is POSITIVE SARS-CoV-2 target nucleic acids are DETECTED. The SARS-CoV-2 RNA is generally detectable in upper and lower  respiratory specimens dur ing the acute phase of infection.  Positive  results are indicative of active infection with SARS-CoV-2.  Clinical  correlation with patient history and other diagnostic information is  necessary to determine patient infection status.  Positive results do  not rule out bacterial infection or co-infection with other viruses. If result is PRESUMPTIVE POSTIVE SARS-CoV-2 nucleic acids MAY BE PRESENT.   A presumptive positive result was obtained on the submitted specimen  and confirmed on repeat testing.  While 2019 novel coronavirus  (SARS-CoV-2) nucleic acids may be present in the submitted sample  additional confirmatory testing may be necessary for epidemiological  and / or clinical management purposes  to differentiate between  SARS-CoV-2 and other Sarbecovirus currently known to infect humans.  If clinically indicated additional testing with an alternate test  methodology [phone]) is advised. The SARS-CoV-2 RNA is generally  detectable in upper and lower respiratory sp ecimens during the acute  phase of infection. The expected result is Negative. Fact Sheet for Patients:  StrictlyIdeas.no Fact Sheet for Healthcare Providers: BankingDealers.co.za This test is not yet approved or cleared by the [place] FDA and has been authorized for detection and/or diagnosis of SARS-CoV-2 by FDA under an Emergency Use Authorization (EUA).  This EUA will remain in effect (meaning this test can be  used) for the duration of the COVID-19 declaration under Section 564(b)(1) of the Act, 21 U.S.C. section 360bbb-3(b)(1), unless the authorization is terminated or revoked sooner. Performed at [facility], [place] [address] [place], [place] [address]   Novel Coronavirus,NAA,(SEND-OUT TO REF LAB - TAT 24-48 hrs); Hosp Order     Status: None   Collection Time: 07/26/18  6:32 AM   Specimen: Nasopharyngeal Swab; Respiratory  Result Value Ref Range Status   SARS-CoV-2, NAA NOT DETECTED NOT DETECTED Final    Comment: (NOTE) This test was developed and its performance characteristics determined by [employer]. This test has not been FDA cleared or approved. This test has been authorized by FDA under an Emergency Use Authorization (EUA). This test is only authorized for the duration of time the declaration that circumstances exist justifying the authorization of the emergency use of in vitro diagnostic tests for detection of SARS-CoV-2 virus and/or diagnosis of COVID-19 infection under section 564(b)(1) of the Act, 21 U.S.C. 248GNO-0(B)(7), unless the authorization is terminated or revoked sooner. When diagnostic testing is negative, the possibility of a false negative result should be  considered in the context of a patient's recent exposures and the presence of clinical signs and symptoms consistent with COVID-19. An individual without symptoms of COVID-19 and who is not shedding SARS-CoV-2 virus would expect to have a negative (not detected) result in this assay. Performed  At: [facility] [place], [place] [id number] Rush Farmer MD YQ:[id number]    [place]  Final    Comment: Performed at [facility] [address], [place], [place] [address]    Studies/Results: No results found.  Medications: Scheduled Meds: . aspirin EC  81 mg Oral Daily  . cholecalciferol  2,000 Units Oral Daily   . folic acid  1 mg Oral Daily  . gabapentin  300 mg Oral TID  . HYDROmorphone   Intravenous Q4H  . hydroxyurea  500 mg Oral BID  . ketorolac  15 mg Intravenous Q6H  . oxyCODONE  20 mg Oral Q12H   Continuous Infusions: . sodium chloride 50 mL/hr at 07/26/18 0938   PRN Meds:.acetaminophen, diphenhydrAMINE, naloxone **AND** sodium chloride flush, ondansetron (ZOFRAN) IV  Assessment/Plan: Active Problems:   Major depressive disorder, recurrent, severe without psychotic features (HCC)   Sickle cell crisis ([place])   Other chronic pain   Anemia of chronic disease  1. Hb Sickle Cell Disease with crisis: Continue IVF D5 .45% Saline @ 125 mls/hour, continue weight based Dilaudid PCA, IV Toradol 15 mg Q 6 H, Monitor vitals very closely, Re-evaluate pain scale regularly, 2 L of Oxygen by [place]. 2. Leukocytosis: No evidence of infection, will continue to monitor 3. Sickle Cell Anemia: Hb is at baseline, no medical indication for blood transfusion today 4. Chronic pain Syndrome: Continue home pain medications 5. Major Depressive Disorder: Nurses have complained about patient being disrespectful and very abusive to them. He has behavioral disorder, and always naked and seen to be "playing with himself". He denies any suicidal ideation or thoughts. Patient will benefit from a Psychiatric evaluation.  Code Status: Full Code Family Communication: N/A Disposition Plan: Not yet ready for discharge  Aram Domzalski  If 7PM-7AM, please contact night-coverage.  07/27/2018, 12:33 PM  LOS: 1 day

## 2018-07-27 NOTE — Progress Notes (Signed)
Pt refused to allow writer to do his assessment.

## 2018-07-27 NOTE — Plan of Care (Signed)
Oxygen admin PRN comfort

## 2018-07-28 ENCOUNTER — Inpatient Hospital Stay (HOSPITAL_COMMUNITY): Payer: Medicaid Other

## 2018-07-28 LAB — URINE CULTURE

## 2018-07-28 MED ORDER — HYDROMORPHONE HCL 1 MG/ML IJ SOLN
1.0000 mg | Freq: Once | INTRAMUSCULAR | Status: AC
  Administered 2018-07-28: 1 mg via INTRAVENOUS

## 2018-07-28 NOTE — Progress Notes (Signed)
Patient ID: Wadsworth Skolnick, male   DOB: Dec 21, 1990, 28 y.o.   MRN: [id number] Subjective:  Timothy Marks  is a 28 y.o. male with a medical history significant for sickle cell disease, type SS, chronic pain syndrome, opiate dependence, history of priapism, and history of adjustment disorder who was admitted yesterday for sickle cell pain crisis.   Patient is still complaining of low back pain, saying he believes something is there. He rates his pain at 10/10. He denies any fever, chest pain, SOB  Objective:  Vital signs in last 24 hours:  Vitals:   07/28/18 1323 07/28/18 1330 07/28/18 1335 07/28/18 1604  BP: (!) 190/160 (!) 142/79 140/77   Pulse: (!) 118 (!) 106 (!) 103   Resp:  (!) 24 (!) 22 11  Temp:  98.6 F (37 C)    TempSrc:  Oral    SpO2: 100% 100% 100% 99%  Weight:      Height:        Intake/Output from previous day:   Intake/Output Summary (Last 24 hours) at 07/28/2018 1810 Last data filed at 07/28/2018 0523 Gross per 24 hour  Intake -  Output 1250 ml  Net -1250 ml    Physical Exam: General: Alert, awake, oriented x3, in no acute distress.  HEENT: [place]/AT PEERL, EOMI Neck: Trachea midline,  no masses, no thyromegal,y no JVD, no carotid bruit OROPHARYNX:  Moist, No exudate/ erythema/lesions.  Heart: Regular rate and rhythm, without murmurs, rubs, gallops, PMI non-displaced, no heaves or thrills on palpation.  Lungs: Clear to auscultation, no wheezing or rhonchi noted. No increased vocal fremitus resonant to percussion  Abdomen: Soft, nontender, nondistended, positive bowel sounds, no masses no hepatosplenomegaly noted..  Neuro: No focal neurological deficits noted cranial nerves II through XII grossly intact. DTRs 2+ bilaterally upper and lower extremities. Strength 5 out of 5 in bilateral upper and lower extremities. Musculoskeletal: No warm swelling or erythema around joints, Lumbar spinal tenderness++ on palpation Psychiatric: Patient alert and oriented x3, good  insight and cognition, good recent to remote recall. Lymph node survey: No cervical axillary or inguinal lymphadenopathy noted.  Lab Results:  Basic Metabolic Panel:    Component Value Date/Time   NA 136 07/27/2018 0807   NA 138 06/28/2018 0854   K 3.8 07/27/2018 0807   CL 100 07/27/2018 0807   CO2 24 07/27/2018 0807   BUN 14 07/27/2018 0807   BUN 5 (L) 06/28/2018 0854   CREATININE 0.61 07/27/2018 0807   GLUCOSE 99 07/27/2018 0807   CALCIUM 8.6 (L) 07/27/2018 0807   CBC:    Component Value Date/Time   WBC 18.4 (H) 07/27/2018 0807   HGB 6.7 (LL) 07/27/2018 0807   HGB 13.0 06/28/2018 0854   HCT 19.3 (L) 07/27/2018 0807   HCT 39.7 06/28/2018 0854   PLT 387 07/27/2018 0807   PLT 350 06/28/2018 0854   MCV 82.5 07/27/2018 0807   MCV 83 06/28/2018 0854   NEUTROABS 15.1 (H) 07/27/2018 0807   NEUTROABS 4.3 06/28/2018 0854   LYMPHSABS 1.9 07/27/2018 0807   LYMPHSABS 2.2 06/28/2018 0854   MONOABS 1.0 07/27/2018 0807   EOSABS 0.3 07/27/2018 0807   EOSABS 0.1 06/28/2018 0854   BASOSABS 0.0 07/27/2018 0807   BASOSABS 0.0 06/28/2018 0854    Recent Results (from the past 240 hour(s))  SARS Coronavirus 2 (CEPHEID - Performed in [facility] hospital lab), Hosp Order     Status: None   Collection Time: 07/22/18  7:42 PM   Specimen: Nasopharyngeal Swab  Result Value Ref Range Status   SARS Coronavirus 2 NEGATIVE NEGATIVE Final    Comment: (NOTE) If result is NEGATIVE SARS-CoV-2 target nucleic acids are NOT DETECTED. The SARS-CoV-2 RNA is generally detectable in upper and lower  respiratory specimens during the acute phase of infection. The lowest  concentration of SARS-CoV-2 viral copies this assay can detect is 250  copies / mL. A negative result does not preclude SARS-CoV-2 infection  and should not be used as the sole basis for treatment or other  patient management decisions.  A negative result may occur with  improper specimen collection / handling, submission of specimen  other  than nasopharyngeal swab, presence of viral mutation(s) within the  areas targeted by this assay, and inadequate number of viral copies  (<250 copies / mL). A negative result must be combined with clinical  observations, patient history, and epidemiological information. If result is POSITIVE SARS-CoV-2 target nucleic acids are DETECTED. The SARS-CoV-2 RNA is generally detectable in upper and lower  respiratory specimens dur ing the acute phase of infection.  Positive  results are indicative of active infection with SARS-CoV-2.  Clinical  correlation with patient history and other diagnostic information is  necessary to determine patient infection status.  Positive results do  not rule out bacterial infection or co-infection with other viruses. If result is PRESUMPTIVE POSTIVE SARS-CoV-2 nucleic acids MAY BE PRESENT.   A presumptive positive result was obtained on the submitted specimen  and confirmed on repeat testing.  While 2019 novel coronavirus  (SARS-CoV-2) nucleic acids may be present in the submitted sample  additional confirmatory testing may be necessary for epidemiological  and / or clinical management purposes  to differentiate between  SARS-CoV-2 and other Sarbecovirus currently known to infect humans.  If clinically indicated additional testing with an alternate test  methodology [phone]) is advised. The SARS-CoV-2 RNA is generally  detectable in upper and lower respiratory sp ecimens during the acute  phase of infection. The expected result is Negative. Fact Sheet for Patients:  StrictlyIdeas.no Fact Sheet for Healthcare Providers: BankingDealers.co.za This test is not yet approved or cleared by the [place] FDA and has been authorized for detection and/or diagnosis of SARS-CoV-2 by FDA under an Emergency Use Authorization (EUA).  This EUA will remain in effect (meaning this test can be used) for the duration  of the COVID-19 declaration under Section 564(b)(1) of the Act, 21 U.S.C. section 360bbb-3(b)(1), unless the authorization is terminated or revoked sooner. Performed at [facility], [place] [address] [place], [place] [address]   Novel Coronavirus,NAA,(SEND-OUT TO REF LAB - TAT 24-48 hrs); Hosp Order     Status: None   Collection Time: 07/26/18  6:32 AM   Specimen: Nasopharyngeal Swab; Respiratory  Result Value Ref Range Status   SARS-CoV-2, NAA NOT DETECTED NOT DETECTED Final    Comment: (NOTE) This test was developed and its performance characteristics determined by [employer]. This test has not been FDA cleared or approved. This test has been authorized by FDA under an Emergency Use Authorization (EUA). This test is only authorized for the duration of time the declaration that circumstances exist justifying the authorization of the emergency use of in vitro diagnostic tests for detection of SARS-CoV-2 virus and/or diagnosis of COVID-19 infection under section 564(b)(1) of the Act, 21 U.S.C. 937JIR-6(V)(8), unless the authorization is terminated or revoked sooner. When diagnostic testing is negative, the possibility of a false negative result should be considered in the context of a patient's  recent exposures and the presence of clinical signs and symptoms consistent with COVID-19. An individual without symptoms of COVID-19 and who is not shedding SARS-CoV-2 virus would expect to have a negative (not detected) result in this assay. Performed  At: [facility] [place], [place] [id number] Rush Farmer MD RW:[id number]    [place]  Final    Comment: Performed at [facility] [address] [place], [place] [address]  Urine culture     Status: Abnormal   Collection Time: 07/26/18  5:21 PM   Specimen: Urine, Clean Catch  Result Value Ref Range Status   Specimen Description   Final     URINE, CLEAN CATCH Performed at [facility], [place] [address], [place], [place] [address]    Special Requests   Final    NONE Performed at [facility], [place] [address] [place], [place] [address]    Culture MULTIPLE SPECIES PRESENT, SUGGEST RECOLLECTION (A)  Final   Report Status 07/28/2018 FINAL  Final    Studies/Results: No results found.  Medications: Scheduled Meds: . aspirin EC  81 mg Oral Daily  . cholecalciferol  2,000 Units Oral Daily  . folic acid  1 mg Oral Daily  . gabapentin  300 mg Oral TID  . HYDROmorphone   Intravenous Q4H  . hydroxyurea  500 mg Oral BID  . ketorolac  15 mg Intravenous Q6H  . oxyCODONE  20 mg Oral Q12H   Continuous Infusions: . sodium chloride 50 mL/hr at 07/28/18 0018   PRN Meds:.acetaminophen, diphenhydrAMINE, menthol-cetylpyridinium, naloxone **AND** sodium chloride flush, ondansetron (ZOFRAN) IV  Assessment/Plan: Active Problems:   Major depressive disorder, recurrent, severe without psychotic features (HCC)   Sickle cell crisis ([place])   Other chronic pain   Anemia of chronic disease  1. Hb Sickle Cell Disease with crisis: Reduce IVF D5 .45% Saline @ 75 mls/hour, continue weight based Dilaudid PCA, IV Toradol 15 mg Q 6 H, Monitor vitals very closely, Re-evaluate pain scale regularly, 2 L of Oxygen by [place]. 2. Leukocytosis: No evidence of infection, will continue to monitor 3. Sickle Cell Anemia: Hb is at baseline, no medical indication for blood transfusion today 4. Chronic pain Syndrome: Continue home pain medications  5. Lumbar Spinal Tenderness: Likely related to sickle cell pain crisis, will continue pain management. X Ray Lumbar spine.  6. Major Depressive Disorder: Nurses have complained about patient being disrespectful and very abusive to them. He has behavioral disorder, and always naked and seen to be "playing with himself". He denies any suicidal ideation or thoughts. Patient will benefit from a  Psychiatric evaluation.  Code Status: Full Code Family Communication: N/A Disposition Plan: Not yet ready for discharge  Quince Santana  If 7PM-7AM, please contact night-coverage.  07/28/2018, 6:10 PM  LOS: 2 days

## 2018-07-28 NOTE — Significant Event (Signed)
Rapid Response Event Note  Overview: Time Called: 1322 Arrival Time: 1324 Event Type: Respiratory  Initial Focused Assessment: Patient lying in bed looking at cell phone. Tachypnic with belly breathing. Patient alert and oriented. RN reports patient is a sickle cell patient on a PCA and PCA had recently acted up and not allowed patient to receive requested pushes. RN notified MD and carried out orders for a one time dose of IV pain medication. Patient then pushed PCA.   HR- Sinus Tach 104 Resp 24 O2 100% BP 142/79 (96)  Interventions: Pt rates pain 8/10 and was instructed to push PCA for pain. Pt pushed PCA. Respiratory rate slowed HR slowed BP lowered. Vital signs stable. MD assessed patient at bedside once patient calmed down.  Plan of Care (if not transferred): Pt remains in 1507. PCA assessed and working properly. Pt calm and resting in bed looking at cell phone. RN instructed to call Rapid Response with any other concerns.        Wray Kearns, RN

## 2018-07-29 LAB — COMPREHENSIVE METABOLIC PANEL
ALT: 15 U/L (ref 0–44)
AST: 28 U/L (ref 15–41)
Albumin: 4 g/dL (ref 3.5–5.0)
Alkaline Phosphatase: 75 U/L (ref 38–126)
Anion gap: 9 (ref 5–15)
BUN: 14 mg/dL (ref 6–20)
CO2: 25 mmol/L (ref 22–32)
Calcium: 9.1 mg/dL (ref 8.9–10.3)
Chloride: 104 mmol/L (ref 98–111)
Creatinine, Ser: 0.63 mg/dL (ref 0.61–1.24)
GFR calc Af Amer: >60 mL/min (ref >60)
GFR calc non Af Amer: >60 mL/min (ref >60)
Glucose, Bld: 95 mg/dL (ref 70–99)
Potassium: 4.2 mmol/L (ref 3.5–5.1)
Sodium: 138 mmol/L (ref 135–145)
Total Bilirubin: 5.4 mg/dL — ABNORMAL HIGH (ref 0.3–1.2)
Total Protein: 7.8 g/dL (ref 6.5–8.1)

## 2018-07-29 LAB — CBC WITH DIFFERENTIAL/PLATELET
Abs Immature Granulocytes: 0.14 10*3/uL — ABNORMAL HIGH (ref 0.00–0.07)
Basophils Absolute: 0 10*3/uL (ref 0.0–0.1)
Basophils Relative: 0 %
Eosinophils Absolute: 0.3 10*3/uL (ref 0.0–0.5)
Eosinophils Relative: 2 %
HCT: 13.6 % — ABNORMAL LOW (ref 39.0–52.0)
Hemoglobin: 4.7 g/dL — CL (ref 13.0–17.0)
Immature Granulocytes: 1 %
Lymphocytes Relative: 26 %
Lymphs Abs: 4.1 10*3/uL — ABNORMAL HIGH (ref 0.7–4.0)
MCH: 27.6 pg (ref 26.0–34.0)
MCHC: 34.6 g/dL (ref 30.0–36.0)
MCV: 80 fL (ref 80.0–100.0)
Monocytes Absolute: 1.4 10*3/uL — ABNORMAL HIGH (ref 0.1–1.0)
Monocytes Relative: 9 %
Neutro Abs: 10 10*3/uL — ABNORMAL HIGH (ref 1.7–7.7)
Neutrophils Relative %: 62 %
Platelets: 373 10*3/uL (ref 150–400)
RBC: 1.7 MIL/uL — ABNORMAL LOW (ref 4.22–5.81)
RDW: 26.1 % — ABNORMAL HIGH (ref 11.5–15.5)
WBC: 15.9 10*3/uL — ABNORMAL HIGH (ref 4.0–10.5)
nRBC: 3.3 % — ABNORMAL HIGH (ref 0.0–0.2)

## 2018-07-29 LAB — HEMOGLOBIN AND HEMATOCRIT, BLOOD
HCT: 17.6 % — ABNORMAL LOW (ref 39.0–52.0)
Hemoglobin: 6.2 g/dL — CL (ref 13.0–17.0)

## 2018-07-29 LAB — PREPARE RBC (CROSSMATCH)

## 2018-07-29 MED ORDER — HYDROMORPHONE HCL 1 MG/ML IJ SOLN
1.0000 mg | INTRAMUSCULAR | Status: AC | PRN
  Administered 2018-07-29 (×2): 1 mg via INTRAVENOUS
  Filled 2018-07-29: qty 1

## 2018-07-29 MED ORDER — HYDROMORPHONE HCL 1 MG/ML IJ SOLN
1.0000 mg | INTRAMUSCULAR | Status: AC | PRN
  Administered 2018-07-30 (×2): 1 mg via INTRAVENOUS
  Filled 2018-07-29 (×2): qty 1

## 2018-07-29 MED ORDER — SODIUM CHLORIDE 0.9% IV SOLUTION
Freq: Once | INTRAVENOUS | Status: AC
  Administered 2018-07-29: 15:00:00 via INTRAVENOUS

## 2018-07-29 MED ORDER — SENNOSIDES-DOCUSATE SODIUM 8.6-50 MG PO TABS
1.0000 | ORAL_TABLET | Freq: Every day | ORAL | Status: DC
  Filled 2018-07-29 (×2): qty 1

## 2018-07-29 NOTE — Progress Notes (Signed)
PHARMACY BRIEF NOTE: HYDROXYUREA   By [facility] policy, hydroxyurea is automatically held when any of the following laboratory values occur:  ANC < 2 K  Pltc < 80K in sickle-cell patients; < 100K in other patients  Hgb <= 6 in sickle-cell patients (hgb 4.7); < 8 in other patients  Reticulocytes < 80K when Hgb < 9  Hydroxyurea has been held (discontinued from profile) per policy.   Dia Sitter, PharmD, BCPS 07/29/2018 9:40 AM

## 2018-07-29 NOTE — Progress Notes (Signed)
CRITICAL VALUE ALERT  Critical Value:  Hgb 4.7  Date & Time Notied:  07/29/18 3403  Provider Notified: [name] [place]  Orders Received/Actions taken: awaiting orders

## 2018-07-29 NOTE — Progress Notes (Signed)
CRITICAL VALUE ALERT  Critical Value:  Hgb 6.2  Date & Time Notied: 07/29/18 at 2045  Provider Notified: Hospitalist  [place]  Orders Received/Actions taken: transfusion  second unit PRBC's

## 2018-07-29 NOTE — Progress Notes (Signed)
Denver Bentson has his bed in a high position, "cause I am tall, I over 6'2". Instructed and encouraged to keep his bed in a low position for safety, fall issues. This patient voiced an understanding; however, Mr Mczeal demanded to keep his bed in the high position. Informed this patient that due to the above fact that the hospital and all medical personal are not liable. Mr Owais voiced an understanding.Marland KitchenMarland Kitchen

## 2018-07-29 NOTE — Progress Notes (Signed)
Patient ID: Timothy Marks, male   DOB: 1990-05-21, 28 y.o.   MRN: [id number] Subjective:  Timothy Marks  is a 28 y.o. male with a medical history significant for sickle cell disease, type SS, chronic pain syndrome, opiate dependence, history of priapism, and history of adjustment disorder who was admitted yesterday for sickle cell pain crisis.   Patient has no new complaints today, still complaining of severe pain in his low back and on the sides around the rib cage.  He denies any fever.  He is able to ambulate in the room.  He denies any headache, chest pain, shortness of breath, dysuria, nausea, vomiting or diarrhea.  He denies any suicidal ideations or thoughts.  Hemoglobin has dropped to 4.7 this morning as against his baseline of around 7.  Patient is being scheduled for blood transfusion this morning.  Objective:  Vital signs in last 24 hours:  Vitals:   07/29/18 0012 07/29/18 0335 07/29/18 0414 07/29/18 0949  BP:   115/80   Pulse:   99   Resp: 17 16 16 12   Temp:   98.1 F (36.7 C)   TempSrc:   Oral   SpO2: 93% 92% 90% 100%  Weight:   62.3 kg   Height:        Intake/Output from previous day:   Intake/Output Summary (Last 24 hours) at 07/29/2018 1216 Last data filed at 07/29/2018 0934 Gross per 24 hour  Intake -  Output 400 ml  Net -400 ml    Physical Exam: General: Alert, awake, oriented x3, in no acute distress. Thin built HEENT: [place]/AT PEERL, EOMI Neck: Trachea midline,  no masses, no thyromegal,y no JVD, no carotid bruit OROPHARYNX:  Moist, No exudate/ erythema/lesions.  Heart: Regular rate and rhythm, without murmurs, rubs, gallops, PMI non-displaced, no heaves or thrills on palpation.  Lungs: Clear to auscultation, no wheezing or rhonchi noted. No increased vocal fremitus resonant to percussion  Abdomen: Soft, nontender, nondistended, positive bowel sounds, no masses no hepatosplenomegaly noted..  Neuro: No focal neurological deficits noted cranial nerves II  through XII grossly intact. DTRs 2+ bilaterally upper and lower extremities. Strength 5 out of 5 in bilateral upper and lower extremities. Musculoskeletal: No warm swelling or erythema around joints, Lumbar spinal tenderness++ on palpation Psychiatric: Patient alert and oriented x3, good insight and cognition, good recent to remote recall. Lymph node survey: No cervical axillary or inguinal lymphadenopathy noted.  Lab Results:  Basic Metabolic Panel:    Component Value Date/Time   NA 138 07/29/2018 0617   NA 138 06/28/2018 0854   K 4.2 07/29/2018 0617   CL 104 07/29/2018 0617   CO2 25 07/29/2018 0617   BUN 14 07/29/2018 0617   BUN 5 (L) 06/28/2018 0854   CREATININE 0.63 07/29/2018 0617   GLUCOSE 95 07/29/2018 0617   CALCIUM 9.1 07/29/2018 0617   CBC:    Component Value Date/Time   WBC 15.9 (H) 07/29/2018 0617   HGB 4.7 (LL) 07/29/2018 0617   HGB 13.0 06/28/2018 0854   HCT 13.6 (L) 07/29/2018 0617   HCT 39.7 06/28/2018 0854   PLT 373 07/29/2018 0617   PLT 350 06/28/2018 0854   MCV 80.0 07/29/2018 0617   MCV 83 06/28/2018 0854   NEUTROABS 10.0 (H) 07/29/2018 0617   NEUTROABS 4.3 06/28/2018 0854   LYMPHSABS 4.1 (H) 07/29/2018 0617   LYMPHSABS 2.2 06/28/2018 0854   MONOABS 1.4 (H) 07/29/2018 0617   EOSABS 0.3 07/29/2018 0617   EOSABS 0.1 06/28/2018 0854  BASOSABS 0.0 07/29/2018 0617   BASOSABS 0.0 06/28/2018 0854    Recent Results (from the past 240 hour(s))  SARS Coronavirus 2 (CEPHEID - Performed in [facility] hospital lab), Hosp Order     Status: None   Collection Time: 07/22/18  7:42 PM   Specimen: Nasopharyngeal Swab  Result Value Ref Range Status   SARS Coronavirus 2 NEGATIVE NEGATIVE Final    Comment: (NOTE) If result is NEGATIVE SARS-CoV-2 target nucleic acids are NOT DETECTED. The SARS-CoV-2 RNA is generally detectable in upper and lower  respiratory specimens during the acute phase of infection. The lowest  concentration of SARS-CoV-2 viral copies this  assay can detect is 250  copies / mL. A negative result does not preclude SARS-CoV-2 infection  and should not be used as the sole basis for treatment or other  patient management decisions.  A negative result may occur with  improper specimen collection / handling, submission of specimen other  than nasopharyngeal swab, presence of viral mutation(s) within the  areas targeted by this assay, and inadequate number of viral copies  (<250 copies / mL). A negative result must be combined with clinical  observations, patient history, and epidemiological information. If result is POSITIVE SARS-CoV-2 target nucleic acids are DETECTED. The SARS-CoV-2 RNA is generally detectable in upper and lower  respiratory specimens dur ing the acute phase of infection.  Positive  results are indicative of active infection with SARS-CoV-2.  Clinical  correlation with patient history and other diagnostic information is  necessary to determine patient infection status.  Positive results do  not rule out bacterial infection or co-infection with other viruses. If result is PRESUMPTIVE POSTIVE SARS-CoV-2 nucleic acids MAY BE PRESENT.   A presumptive positive result was obtained on the submitted specimen  and confirmed on repeat testing.  While 2019 novel coronavirus  (SARS-CoV-2) nucleic acids may be present in the submitted sample  additional confirmatory testing may be necessary for epidemiological  and / or clinical management purposes  to differentiate between  SARS-CoV-2 and other Sarbecovirus currently known to infect humans.  If clinically indicated additional testing with an alternate test  methodology [phone]) is advised. The SARS-CoV-2 RNA is generally  detectable in upper and lower respiratory sp ecimens during the acute  phase of infection. The expected result is Negative. Fact Sheet for Patients:  StrictlyIdeas.no Fact Sheet for Healthcare  Providers: BankingDealers.co.za This test is not yet approved or cleared by the [place] FDA and has been authorized for detection and/or diagnosis of SARS-CoV-2 by FDA under an Emergency Use Authorization (EUA).  This EUA will remain in effect (meaning this test can be used) for the duration of the COVID-19 declaration under Section 564(b)(1) of the Act, 21 U.S.C. section 360bbb-3(b)(1), unless the authorization is terminated or revoked sooner. Performed at [facility] [place] [address], [place], [place] [address]   Novel Coronavirus,NAA,(SEND-OUT TO REF LAB - TAT 24-48 hrs); Hosp Order     Status: None   Collection Time: 07/26/18  6:32 AM   Specimen: Nasopharyngeal Swab; Respiratory  Result Value Ref Range Status   SARS-CoV-2, NAA NOT DETECTED NOT DETECTED Final    Comment: (NOTE) This test was developed and its performance characteristics determined by [employer]. This test has not been FDA cleared or approved. This test has been authorized by FDA under an Emergency Use Authorization (EUA). This test is only authorized for the duration of time the declaration that circumstances exist justifying the authorization of the emergency  use of in vitro diagnostic tests for detection of SARS-CoV-2 virus and/or diagnosis of COVID-19 infection under section 564(b)(1) of the Act, 21 U.S.C. 371GGY-6(R)(4), unless the authorization is terminated or revoked sooner. When diagnostic testing is negative, the possibility of a false negative result should be considered in the context of a patient's recent exposures and the presence of clinical signs and symptoms consistent with COVID-19. An individual without symptoms of COVID-19 and who is not shedding SARS-CoV-2 virus would expect to have a negative (not detected) result in this assay. Performed  At: [facility] [place], [place] [id number] Rush Farmer MD  KK:[id number]    [place]  Final    Comment: Performed at [place] [address] [place], [place] [address]  Urine culture     Status: Abnormal   Collection Time: 07/26/18  5:21 PM   Specimen: Urine, Clean Catch  Result Value Ref Range Status   Specimen Description   Final    URINE, CLEAN CATCH Performed at [facility], [place] [address], [place], [place] [address]    Special Requests   Final    NONE Performed at [facility], [place] [address] [place], [place] [address]    Culture MULTIPLE SPECIES PRESENT, SUGGEST RECOLLECTION (A)  Final   Report Status 07/28/2018 FINAL  Final    Studies/Results: Dg Lumbar Spine 1 View  Result Date: 07/28/2018 CLINICAL DATA:  Sickle cell pain.  Low back pain.  No injury. EXAM: LUMBAR SPINE - 1 VIEW COMPARISON:  10/11/2016 FINDINGS: Only single AP view submitted which limits study. Diffuse bony changes of sickle cell again noted throughout the lumbar spine and pelvis. No visible fracture. Disc spaces appear maintained. IMPRESSION: Limited single AP view only, limiting evaluation. No visible acute bony abnormality. Osseous changes of sickle cell disease. Electronically Signed   By: Rolm Baptise M.D.   On: 07/28/2018 19:11    Medications: Scheduled Meds: . sodium chloride   Intravenous Once  . aspirin EC  81 mg Oral Daily  . cholecalciferol  2,000 Units Oral Daily  . folic acid  1 mg Oral Daily  . gabapentin  300 mg Oral TID  . HYDROmorphone   Intravenous Q4H  . ketorolac  15 mg Intravenous Q6H  . oxyCODONE  20 mg Oral Q12H  . senna-docusate  1 tablet Oral QHS   Continuous Infusions: . sodium chloride 50 mL/hr at 07/28/18 0018   PRN Meds:.acetaminophen, diphenhydrAMINE, menthol-cetylpyridinium, naloxone **AND** sodium chloride flush, ondansetron (ZOFRAN) IV  Assessment/Plan: Active Problems:   Major depressive disorder, recurrent, severe without psychotic  features (HCC)   Sickle cell crisis ([place])   Other chronic pain   Anemia of chronic disease  1. Hb Sickle Cell Disease with crisis: Continue IVF D5 .45% Saline @ 50 mls/hour, continue weight based Dilaudid PCA, IV Toradol 15 mg Q 6 H, Monitor vitals very closely, Re-evaluate pain scale regularly, 2 L of Oxygen by [place]. 2. Leukocytosis: No evidence of infection, will continue to monitor 3. Sickle Cell Anemia: Hemoglobin has dropped to 4.7 today, patient is asymptomatic, baseline is around 7. We will transfuse patient with 2 units of packed red blood cells today. Monitor closely and check hemoglobin in a.m. 4. Chronic pain Syndrome: Continue home pain medications  5. Lumbar Spinal Tenderness: X Ray Lumbar spine is negative for any acute disease. Pain is likely related to sickle cell crisis as explained to patient.  Continue pain management. 6. Major Depressive  Disorder: Patient is still very aggressive, verbally abusive to staff and nurses, refusing to follow instructions especially to wear his oxygen nasal cannula. He denies any suicidal ideation or thoughts. Patient will benefit from a Psychiatric evaluation, patient has been referred.  Code Status: Full Code Family Communication: N/A Disposition Plan: Not yet ready for discharge  Timothy Marks  If 7PM-7AM, please contact night-coverage.  07/29/2018, 12:16 PM  LOS: 3 days

## 2018-07-30 ENCOUNTER — Telehealth: Payer: Self-pay

## 2018-07-30 LAB — CBC WITH DIFFERENTIAL/PLATELET

## 2018-07-30 MED ORDER — OXYCODONE-ACETAMINOPHEN 10-325 MG PO TABS: 1.0000 | ORAL_TABLET | Freq: Four times a day (QID) | ORAL | Status: DC | PRN

## 2018-07-30 MED ORDER — OXYCODONE HCL 5 MG PO TABS
5.0000 mg | ORAL_TABLET | Freq: Four times a day (QID) | ORAL | Status: DC | PRN
  Administered 2018-07-30: 5 mg via ORAL
  Filled 2018-07-30: qty 1

## 2018-07-30 MED ORDER — LIP MEDEX EX OINT
1.0000 "application " | TOPICAL_OINTMENT | CUTANEOUS | Status: DC | PRN
  Administered 2018-07-30: 1 via TOPICAL
  Filled 2018-07-30: qty 7

## 2018-07-30 MED ORDER — OXYCODONE-ACETAMINOPHEN 5-325 MG PO TABS
1.0000 | ORAL_TABLET | Freq: Four times a day (QID) | ORAL | Status: DC | PRN
  Administered 2018-07-30 – 2018-07-31 (×2): 1 via ORAL
  Filled 2018-07-30 (×2): qty 1

## 2018-07-30 MED ORDER — HYDROMORPHONE 1 MG/ML IV SOLN
INTRAVENOUS | Status: DC
  Administered 2018-07-30: 5.4 mg via INTRAVENOUS
  Administered 2018-07-30: 3.9 mg via INTRAVENOUS
  Administered 2018-07-30: 3.3 mg via INTRAVENOUS
  Administered 2018-07-31: 1.2 mg via INTRAVENOUS
  Administered 2018-07-31: 2.7 mg via INTRAVENOUS

## 2018-07-30 NOTE — Telephone Encounter (Signed)
This is the only pharmacy listed right now. Thanks !

## 2018-07-30 NOTE — Progress Notes (Signed)
Timothy Marks refused his am labs.This patient is agitated and irritable along with an ill mannered, offensively impolite demeanor. Encouraged this patient to voiced any and all concerns. Emotional support provided. Am labs to be collect at 0800.

## 2018-07-30 NOTE — Progress Notes (Signed)
Patient ID: [place] Arch, male   DOB: 04/23/1990, 28 y.o.   MRN: [id number] Subjective:  Elyan Vanwieren  is a 28 y.o. male with a medical history significant for sickle cell disease, type SS, chronic pain syndrome, opiate dependence, history of priapism, and history of adjustment disorder who was admitted yesterday for sickle cell pain crisis.   Patient has no new complaint today, saying he feels slightly better.  Still complaining of pain in his low back that he rates at 8/10, his goal is below 5/10. He denies any fever, headache, chest pain, shortness of breath, dysuria, nausea, vomiting or diarrhea. There is ongoing reports from the nurses that patient is very rude, offensively impolite and ill-mannered. He denies any suicidal ideations or thoughts.   Patient is status post transfusion of 2 units of packed red blood cell, hemoglobin improved to 6.2 this morning.  No new symptoms.  Objective:  Vital signs in last 24 hours:  Vitals:   07/30/18 0732 07/30/18 0800 07/30/18 1133 07/30/18 1200  BP: 112/62   119/64  Pulse: 89   87  Resp: 19 (!) 9 11 13   Temp: 98.7 F (37.1 C)   97.8 F (36.6 C)  TempSrc:    Oral  SpO2: 99% 100% 100% 98%  Weight:      Height:        Intake/Output from previous day:   Intake/Output Summary (Last 24 hours) at 07/30/2018 1614 Last data filed at 07/30/2018 1421 Gross per 24 hour  Intake 5330.21 ml  Output 1700 ml  Net 3630.21 ml    Physical Exam: General: Alert, awake, oriented x3, in no acute distress. Thin built HEENT: [place]/AT PEERL, EOMI Neck: Trachea midline,  no masses, no thyromegal,y no JVD, no carotid bruit OROPHARYNX:  Moist, No exudate/ erythema/lesions.  Heart: Regular rate and rhythm, without murmurs, rubs, gallops, PMI non-displaced, no heaves or thrills on palpation.  Lungs: Clear to auscultation, no wheezing or rhonchi noted. No increased vocal fremitus resonant to percussion  Abdomen: Soft, nontender, nondistended, positive bowel  sounds, no masses no hepatosplenomegaly noted..  Neuro: No focal neurological deficits noted cranial nerves II through XII grossly intact. DTRs 2+ bilaterally upper and lower extremities. Strength 5 out of 5 in bilateral upper and lower extremities. Musculoskeletal: No warm swelling or erythema around joints, Lumbar spinal tenderness++ on palpation Psychiatric: Patient alert and oriented x3, good insight and cognition, good recent to remote recall. Lymph node survey: No cervical axillary or inguinal lymphadenopathy noted.  Lab Results:  Basic Metabolic Panel:    Component Value Date/Time   NA 138 07/29/2018 0617   NA 138 06/28/2018 0854   K 4.2 07/29/2018 0617   CL 104 07/29/2018 0617   CO2 25 07/29/2018 0617   BUN 14 07/29/2018 0617   BUN 5 (L) 06/28/2018 0854   CREATININE 0.63 07/29/2018 0617   GLUCOSE 95 07/29/2018 0617   CALCIUM 9.1 07/29/2018 0617   CBC:    Component Value Date/Time   WBC  PREF NAKAIA RN AWARE 1209 PL 07/30/2018 0900   HGB  PREF NAKAIA RN AWARE 1209 PL 07/30/2018 0900   HGB 13.0 06/28/2018 0854   HCT  PREF NAKAIA RN AWARE 1209 PL 07/30/2018 0900   HCT 39.7 06/28/2018 0854   PLT  PREF NAKAIA RN AWARE 1209 PL 07/30/2018 0900   PLT 350 06/28/2018 0854   MCV  PREF NAKAIA RN AWARE 1209 PL 07/30/2018 0900   MCV 83 06/28/2018 0854   NEUTROABS PENDING 07/30/2018 0900  NEUTROABS 4.3 06/28/2018 0854   LYMPHSABS PENDING 07/30/2018 0900   LYMPHSABS 2.2 06/28/2018 0854   MONOABS PENDING 07/30/2018 0900   EOSABS PENDING 07/30/2018 0900   EOSABS 0.1 06/28/2018 0854   BASOSABS PENDING 07/30/2018 0900   BASOSABS 0.0 06/28/2018 0854    Recent Results (from the past 240 hour(s))  SARS Coronavirus 2 (CEPHEID - Performed in [facility] hospital lab), Hosp Order     Status: None   Collection Time: 07/22/18  7:42 PM   Specimen: Nasopharyngeal Swab  Result Value Ref Range Status   SARS Coronavirus 2 NEGATIVE NEGATIVE Final    Comment: (NOTE) If result is  NEGATIVE SARS-CoV-2 target nucleic acids are NOT DETECTED. The SARS-CoV-2 RNA is generally detectable in upper and lower  respiratory specimens during the acute phase of infection. The lowest  concentration of SARS-CoV-2 viral copies this assay can detect is 250  copies / mL. A negative result does not preclude SARS-CoV-2 infection  and should not be used as the sole basis for treatment or other  patient management decisions.  A negative result may occur with  improper specimen collection / handling, submission of specimen other  than nasopharyngeal swab, presence of viral mutation(s) within the  areas targeted by this assay, and inadequate number of viral copies  (<250 copies / mL). A negative result must be combined with clinical  observations, patient history, and epidemiological information. If result is POSITIVE SARS-CoV-2 target nucleic acids are DETECTED. The SARS-CoV-2 RNA is generally detectable in upper and lower  respiratory specimens dur ing the acute phase of infection.  Positive  results are indicative of active infection with SARS-CoV-2.  Clinical  correlation with patient history and other diagnostic information is  necessary to determine patient infection status.  Positive results do  not rule out bacterial infection or co-infection with other viruses. If result is PRESUMPTIVE POSTIVE SARS-CoV-2 nucleic acids MAY BE PRESENT.   A presumptive positive result was obtained on the submitted specimen  and confirmed on repeat testing.  While 2019 novel coronavirus  (SARS-CoV-2) nucleic acids may be present in the submitted sample  additional confirmatory testing may be necessary for epidemiological  and / or clinical management purposes  to differentiate between  SARS-CoV-2 and other Sarbecovirus currently known to infect humans.  If clinically indicated additional testing with an alternate test  methodology [phone]) is advised. The SARS-CoV-2 RNA is generally  detectable  in upper and lower respiratory sp ecimens during the acute  phase of infection. The expected result is Negative. Fact Sheet for Patients:  StrictlyIdeas.no Fact Sheet for Healthcare Providers: BankingDealers.co.za This test is not yet approved or cleared by the [place] FDA and has been authorized for detection and/or diagnosis of SARS-CoV-2 by FDA under an Emergency Use Authorization (EUA).  This EUA will remain in effect (meaning this test can be used) for the duration of the COVID-19 declaration under Section 564(b)(1) of the Act, 21 U.S.C. section 360bbb-3(b)(1), unless the authorization is terminated or revoked sooner. Performed at [facility], [place] [address], [place], [place] [address]   Novel Coronavirus,NAA,(SEND-OUT TO REF LAB - TAT 24-48 hrs); Hosp Order     Status: None   Collection Time: 07/26/18  6:32 AM   Specimen: Nasopharyngeal Swab; Respiratory  Result Value Ref Range Status   SARS-CoV-2, NAA NOT DETECTED NOT DETECTED Final    Comment: (NOTE) This test was developed and its performance characteristics determined by [employer]. This test has not been FDA cleared  or approved. This test has been authorized by FDA under an Emergency Use Authorization (EUA). This test is only authorized for the duration of time the declaration that circumstances exist justifying the authorization of the emergency use of in vitro diagnostic tests for detection of SARS-CoV-2 virus and/or diagnosis of COVID-19 infection under section 564(b)(1) of the Act, 21 U.S.C. 962XBM-8(U)(1), unless the authorization is terminated or revoked sooner. When diagnostic testing is negative, the possibility of a false negative result should be considered in the context of a patient's recent exposures and the presence of clinical signs and symptoms consistent with COVID-19. An individual without symptoms of COVID-19 and who is  not shedding SARS-CoV-2 virus would expect to have a negative (not detected) result in this assay. Performed  At: [facility] [place], [place] [id number] Rush Farmer MD OZ:[id number]    [place]  Final    Comment: Performed at [place] [address] [place], [place] [address]  Urine culture     Status: Abnormal   Collection Time: 07/26/18  5:21 PM   Specimen: Urine, Clean Catch  Result Value Ref Range Status   Specimen Description   Final    URINE, CLEAN CATCH Performed at [facility], [place] [address], [place], [place] [address]    Special Requests   Final    NONE Performed at [facility], [place] [address], [place], [place] [address]    Culture MULTIPLE SPECIES PRESENT, SUGGEST RECOLLECTION (A)  Final   Report Status 07/28/2018 FINAL  Final    Studies/Results: Dg Lumbar Spine 1 View  Result Date: 07/28/2018 CLINICAL DATA:  Sickle cell pain.  Low back pain.  No injury. EXAM: LUMBAR SPINE - 1 VIEW COMPARISON:  10/11/2016 FINDINGS: Only single AP view submitted which limits study. Diffuse bony changes of sickle cell again noted throughout the lumbar spine and pelvis. No visible fracture. Disc spaces appear maintained. IMPRESSION: Limited single AP view only, limiting evaluation. No visible acute bony abnormality. Osseous changes of sickle cell disease. Electronically Signed   By: Rolm Baptise M.D.   On: 07/28/2018 19:11    Medications: Scheduled Meds: . aspirin EC  81 mg Oral Daily  . cholecalciferol  2,000 Units Oral Daily  . folic acid  1 mg Oral Daily  . gabapentin  300 mg Oral TID  . HYDROmorphone   Intravenous Q4H  . oxyCODONE  20 mg Oral Q12H  . senna-docusate  1 tablet Oral QHS   Continuous Infusions: . sodium chloride 10 mL/hr at 07/30/18 1223   PRN Meds:.acetaminophen, diphenhydrAMINE, menthol-cetylpyridinium, naloxone **AND** sodium chloride flush,  ondansetron (ZOFRAN) IV, oxyCODONE-acetaminophen **AND** oxyCODONE  Assessment/Plan: Active Problems:   Major depressive disorder, recurrent, severe without psychotic features (HCC)   Sickle cell crisis ([place])   Other chronic pain   Anemia of chronic disease  1. Hb Sickle Cell Disease with crisis: IV fluid at [facility], continue weight based Dilaudid PCA, IV Toradol 15 mg Q 6 H, Monitor vitals very closely, Re-evaluate pain scale regularly, 2 L of Oxygen by [place].  Possible discharge tomorrow. 2. Leukocytosis: No fever, no evidence of infection, will continue to monitor 3. Sickle Cell Anemia: Hemoglobin is now 6.2 status post blood transfusion of 2 units of packed red blood cells.  4. Chronic pain Syndrome: Continue home pain medications  5. Lumbar Spinal Tenderness: X Ray Lumbar spine is negative for any acute disease. Pain is likely related to sickle cell crisis as explained  to patient. Continue pain management. 6. Major Depressive Disorder: Patient is still very aggressive, verbally abusive to staff and nurses, refusing to follow instructions especially to wear his oxygen nasal cannula. He denies any suicidal ideation or thoughts. Patient will benefit from a Psychiatric evaluation, patient has been referred.  Code Status: Full Code Family Communication: N/A Disposition Plan: Not yet ready for discharge  Zavon Hyson  If 7PM-7AM, please contact night-coverage.  07/30/2018, 4:14 PM  LOS: 4 days

## 2018-07-31 ENCOUNTER — Telehealth: Payer: Self-pay

## 2018-07-31 ENCOUNTER — Other Ambulatory Visit: Payer: Self-pay | Admitting: Internal Medicine

## 2018-07-31 DIAGNOSIS — D571 Sickle-cell disease without crisis: Secondary | ICD-10-CM

## 2018-07-31 LAB — BPAM RBC
Blood Product Expiration Date: 202007062359
Blood Product Expiration Date: 202007062359
ISSUE DATE / TIME: 202006141503
ISSUE DATE / TIME: 202006150009
Unit Type and Rh: 9500
Unit Type and Rh: 9500

## 2018-07-31 LAB — TYPE AND SCREEN
ABO/RH(D): A POS
Antibody Screen: NEGATIVE
Donor AG Type: NEGATIVE
Donor AG Type: NEGATIVE
Unit division: 0
Unit division: 0

## 2018-07-31 LAB — CBC
HCT: 25.7 % — ABNORMAL LOW (ref 39.0–52.0)
Hemoglobin: 8.7 g/dL — ABNORMAL LOW (ref 13.0–17.0)
MCH: 30.7 pg (ref 26.0–34.0)
MCHC: 33.9 g/dL (ref 30.0–36.0)
MCV: 90.8 fL (ref 80.0–100.0)
Platelets: 473 10*3/uL — ABNORMAL HIGH (ref 150–400)
RBC: 2.83 MIL/uL — ABNORMAL LOW (ref 4.22–5.81)
RDW: 23.8 % — ABNORMAL HIGH (ref 11.5–15.5)
WBC: 14 10*3/uL — ABNORMAL HIGH (ref 4.0–10.5)
nRBC: 40.2 % — ABNORMAL HIGH (ref 0.0–0.2)

## 2018-07-31 MED ORDER — OXYCODONE-ACETAMINOPHEN 10-325 MG PO TABS: 1.0000 | ORAL_TABLET | Freq: Four times a day (QID) | ORAL | 0 refills | Status: DC | PRN

## 2018-07-31 MED ORDER — OXYCODONE HCL ER 20 MG PO T12A: 20.0000 mg | EXTENDED_RELEASE_TABLET | Freq: Two times a day (BID) | ORAL | 0 refills | Status: DC

## 2018-07-31 NOTE — Telephone Encounter (Signed)
Called and spoke with Juliann Pulse at pharmacy, asked to delete oxycodone 20mg  from [facility], that due to insurance lock in program it will be sent in from Dr. Doreene Burke. Juliann Pulse advised she will delete it. Thanks!

## 2018-07-31 NOTE — Progress Notes (Signed)
Pulse oximetry on room air is 100%. No signs of distress. Will continue to monitor.

## 2018-07-31 NOTE — Discharge Summary (Signed)
Physician Discharge Summary  Timothy Marks [facility]:[id number] DOB: 1990/06/25 DOA: 07/26/2018  PCP: Lanae Boast, FNP  Admit date: 07/26/2018  Discharge date: 07/31/2018  Discharge Diagnoses:  Active Problems:   Major depressive disorder, recurrent, severe without psychotic features ([name])   Sickle cell crisis ([name])   Other chronic pain   Anemia of chronic disease   Discharge Condition: Stable  Disposition:  Pt is discharged home in good condition and is to follow up with Lanae Boast, FNP in 2 weeks to have labs evaluated. Vamsi Apfel is instructed to increase activity slowly and balance with rest for the next few days, and use prescribed medication to complete treatment of pain  Diet: Regular Wt Readings from Last 3 Encounters:  07/29/18 62.3 kg  07/24/18 61.2 kg  07/22/18 (S) 61 kg    History of present illness:  Timothy Marks  is a 28 y.o. male with a medical history significant for sickle cell disease, type SS, chronic pain syndrome, opiate dependence, history of priapism, and history of adjustment disorder presented to sickle cell day infusion center complaining of pain primarily to low back and lower extremities that is consistent with his typical sickle cell crisis.  Prior to transitioning to clinic, patient was treated and evaluated in the ER this a.m.  He states that pain intensity has been increased over the past week and has been uncontrolled with OxyContin and oxycodone.  Current pain intensity is 8/10 characterized as constant, throbbing, and occasionally sharp. Patient denies headache, dizziness, chest pain, sore throat, persistent cough, nausea, vomiting, or diarrhea.  He also denies fever, chills, sick contacts, recent travel, or exposure to COVID-19.  Sickle cell day infusion center course: Hemoglobin 8.4, which is consistent with patient's baseline.  WBCs 12.0, platelets 452, creatinine 0.53, and total bilirubin 2.9.  Blood pressure 117/71, pulse 83, patient  afebrile, and maintaining oxygen saturation at 100% on RA.  Pain persists treatment with Dilaudid, IV Toradol, Tylenol by mouth, patient admitted to [facility] for sickle cell pain crisis.  Hospital Course:  Patient was admitted for sickle cell pain crisis and managed appropriately with IVF, IV Dilaudid via PCA and IV Toradol, as well as other adjunct therapies per sickle cell pain management protocols.  Dilaudid PCA weaned appropriately.  Patient transition to home medications.  OxyContin was continued without interruption.  Pain intensity decreased to 5/10, primarily to lower back.  Patient states that he can manage at home on medication regimen.  He is requested opiate medications from PCP.  PMP reviewed, no inconsistencies noted.  Anemia of chronic disease: Hemoglobin decreased to 4.7, which is below patient's baseline of 8.0-9.0.  Patient was transfused 2 units of packed red blood cells.  Hemoglobin returned to baseline, 8.7 prior to discharge.  Patient advised to follow-up with PCP in 2 weeks to repeat CBC and CMP.  Leukocytosis: WBCs elevated on admission.  Trending down appropriately.  No evidence of infection or inflammation throughout admission.  Major depressive disorder: Patient has a history of MDD and adjustment disorder with aggressive and verbally abusive behavior.  Has been aggressive, irritable, and verbally abusive to staff throughout admission.  Patient denies suicidal or homicidal ideations.  He also denies visual or auditory hallucinations.  Patient has been referred to psychiatry for psychiatric evaluation.  He is currently not on antidepressant, antianxiety, or antipsychotic medications.  Patient alert, oriented, and ambulating without assistance.  Patient's pain intensity decreased to 5/10, patient afebrile and maintaining oxygen saturation above 90% on RA.  Patient is aware of  all upcoming appointments.  Patient was discharged home today in a hemodynamically stable condition.    Discharge Exam: Vitals:   07/31/18 0755 07/31/18 0850  BP: 120/72   Pulse: 62   Resp: 12 11  Temp: 98.7 F (37.1 C)   SpO2: 98% 100%   Vitals:   07/31/18 0424 07/31/18 0429 07/31/18 0755 07/31/18 0850  BP:  114/79 120/72   Pulse:  (!) 53 62   Resp: (!) 9 12 12 11   Temp:  98.3 F (36.8 C) 98.7 F (37.1 C)   TempSrc:   Oral   SpO2: 100% 100% 98% 100%  Weight:      Height:       Physical Exam Constitutional:      Appearance: Normal appearance.  HENT:     Head: Normocephalic.     Mouth/Throat:     Mouth: Mucous membranes are moist.     Pharynx: Oropharynx is clear.  Cardiovascular:     Rate and Rhythm: Normal rate and regular rhythm.     Pulses: Normal pulses.     Heart sounds: Normal heart sounds.  Pulmonary:     Effort: Pulmonary effort is normal.     Breath sounds: Normal breath sounds.  Abdominal:     General: Abdomen is flat. Bowel sounds are normal.  Musculoskeletal: Normal range of motion.  Skin:    General: Skin is warm and dry.  Neurological:     General: No focal deficit present.     Mental Status: He is alert. Mental status is at baseline.  Psychiatric:        Mood and Affect: Mood normal.        Behavior: Behavior normal.        Thought Content: Thought content normal.        Judgment: Judgment normal.     Discharge Instructions  Discharge Instructions    Discharge patient   Complete by: As directed    Discharge disposition: 01-Home or Self Care   Discharge patient date: 07/31/2018     Allergies as of 07/31/2018      Reactions   Buprenorphine Hcl Hives   Levaquin [levofloxacin] Itching   Meperidine Rash   Morphine And Related Hives   Toradol [ketorolac Tromethamine] Itching   Tramadol Hives   Vancomycin Itching   Zosyn [piperacillin Sod-tazobactam So] Itching, Rash   Has taken rocephin in past      Medication List    TAKE these medications   aspirin EC 81 MG tablet Take 1 tablet (81 mg total) by mouth daily.   cholecalciferol  25 MCG (1000 UT) tablet Commonly known as: VITAMIN D Take 2 tablets (2,000 Units total) by mouth daily.   folic acid 1 MG tablet Commonly known as: FOLVITE Take 1 tablet (1 mg total) by mouth daily.   gabapentin 300 MG capsule Commonly known as: NEURONTIN TAKE 1 CAPSULE(300 MG) BY MOUTH THREE TIMES DAILY What changed: See the new instructions.   hydroxyurea 500 MG capsule Commonly known as: HYDREA Take 1 capsule (500 mg total) by mouth 2 (two) times daily. May take with food to minimize GI side effects.   ibuprofen 800 MG tablet Commonly known as: ADVIL Take 1 tablet (800 mg total) by mouth 3 (three) times daily as needed (pain).   oxyCODONE 20 mg 12 hr tablet Commonly known as: OXYCONTIN Take 1 tablet (20 mg total) by mouth every 12 (twelve) hours.   oxyCODONE-acetaminophen 10-325 MG tablet Commonly known as: PERCOCET Take 1  tablet by mouth every 6 (six) hours as needed for pain.       The results of significant diagnostics from this hospitalization (including imaging, microbiology, ancillary and laboratory) are listed below for reference.    Significant Diagnostic Studies: Dg Lumbar Spine 1 View  Result Date: 07/28/2018 CLINICAL DATA:  Sickle cell pain.  Low back pain.  No injury. EXAM: LUMBAR SPINE - 1 VIEW COMPARISON:  10/11/2016 FINDINGS: Only single AP view submitted which limits study. Diffuse bony changes of sickle cell again noted throughout the lumbar spine and pelvis. No visible fracture. Disc spaces appear maintained. IMPRESSION: Limited single AP view only, limiting evaluation. No visible acute bony abnormality. Osseous changes of sickle cell disease. Electronically Signed   By: Rolm Baptise M.D.   On: 07/28/2018 19:11    Microbiology: Recent Results (from the past 240 hour(s))  SARS Coronavirus 2 (CEPHEID - Performed in [facility] hospital lab), Hosp Order     Status: None   Collection Time: 07/22/18  7:42 PM   Specimen: Nasopharyngeal Swab  Result Value  Ref Range Status   SARS Coronavirus 2 NEGATIVE NEGATIVE Final    Comment: (NOTE) If result is NEGATIVE SARS-CoV-2 target nucleic acids are NOT DETECTED. The SARS-CoV-2 RNA is generally detectable in upper and lower  respiratory specimens during the acute phase of infection. The lowest  concentration of SARS-CoV-2 viral copies this assay can detect is 250  copies / mL. A negative result does not preclude SARS-CoV-2 infection  and should not be used as the sole basis for treatment or other  patient management decisions.  A negative result may occur with  improper specimen collection / handling, submission of specimen other  than nasopharyngeal swab, presence of viral mutation(s) within the  areas targeted by this assay, and inadequate number of viral copies  (<250 copies / mL). A negative result must be combined with clinical  observations, patient history, and epidemiological information. If result is POSITIVE SARS-CoV-2 target nucleic acids are DETECTED. The SARS-CoV-2 RNA is generally detectable in upper and lower  respiratory specimens dur ing the acute phase of infection.  Positive  results are indicative of active infection with SARS-CoV-2.  Clinical  correlation with patient history and other diagnostic information is  necessary to determine patient infection status.  Positive results do  not rule out bacterial infection or co-infection with other viruses. If result is PRESUMPTIVE POSTIVE SARS-CoV-2 nucleic acids MAY BE PRESENT.   A presumptive positive result was obtained on the submitted specimen  and confirmed on repeat testing.  While 2019 novel coronavirus  (SARS-CoV-2) nucleic acids may be present in the submitted sample  additional confirmatory testing may be necessary for epidemiological  and / or clinical management purposes  to differentiate between  SARS-CoV-2 and other Sarbecovirus currently known to infect humans.  If clinically indicated additional testing with an  alternate test  methodology [phone]) is advised. The SARS-CoV-2 RNA is generally  detectable in upper and lower respiratory sp ecimens during the acute  phase of infection. The expected result is Negative. Fact Sheet for Patients:  StrictlyIdeas.no Fact Sheet for Healthcare Providers: BankingDealers.co.za This test is not yet approved or cleared by the [place] FDA and has been authorized for detection and/or diagnosis of SARS-CoV-2 by FDA under an Emergency Use Authorization (EUA).  This EUA will remain in effect (meaning this test can be used) for the duration of the COVID-19 declaration under Section 564(b)(1) of the Act, 21 U.S.C. section 360bbb-3(b)(1), unless the  authorization is terminated or revoked sooner. Performed at [facility], [place] [address], [place], [place] [address]   Novel Coronavirus,NAA,(SEND-OUT TO REF LAB - TAT 24-48 hrs); Hosp Order     Status: None   Collection Time: 07/26/18  6:32 AM   Specimen: Nasopharyngeal Swab; Respiratory  Result Value Ref Range Status   SARS-CoV-2, NAA NOT DETECTED NOT DETECTED Final    Comment: (NOTE) This test was developed and its performance characteristics determined by [employer]. This test has not been FDA cleared or approved. This test has been authorized by FDA under an Emergency Use Authorization (EUA). This test is only authorized for the duration of time the declaration that circumstances exist justifying the authorization of the emergency use of in vitro diagnostic tests for detection of SARS-CoV-2 virus and/or diagnosis of COVID-19 infection under section 564(b)(1) of the Act, 21 U.S.C. 606TKZ-6(W)(1), unless the authorization is terminated or revoked sooner. When diagnostic testing is negative, the possibility of a false negative result should be considered in the context of a patient's recent exposures and the presence of clinical signs  and symptoms consistent with COVID-19. An individual without symptoms of COVID-19 and who is not shedding SARS-CoV-2 virus would expect to have a negative (not detected) result in this assay. Performed  At: [facility] [place], [place] [id number] Rush Farmer MD UK:[id number]    [place]  Final    Comment: Performed at [facility] [address], [place], [place] [address]  Urine culture     Status: Abnormal   Collection Time: 07/26/18  5:21 PM   Specimen: Urine, Clean Catch  Result Value Ref Range Status   Specimen Description   Final    URINE, CLEAN CATCH Performed at [facility], [place] [address], [place], [place] [address]    Special Requests   Final    NONE Performed at [facility], [place] [address] [place], [place] [address]    Culture MULTIPLE SPECIES PRESENT, SUGGEST RECOLLECTION (A)  Final   Report Status 07/28/2018 FINAL  Final     Labs: Basic Metabolic Panel: Recent Labs  Lab 07/26/18 0240 07/27/18 0807 07/29/18 0617  NA 137 136 138  K 4.0 3.8 4.2  CL 102 100 104  CO2 26 24 25   GLUCOSE 91 99 95  BUN 6 14 14   CREATININE 0.53* 0.61 0.63  CALCIUM 9.2 8.6* 9.1   Liver Function Tests: Recent Labs  Lab 07/26/18 0240 07/27/18 0807 07/29/18 0617  AST 19 31 28   ALT 9 20 15   ALKPHOS 77 87 75  BILITOT 2.9* 7.0* 5.4*  PROT 8.1 7.8 7.8  ALBUMIN 4.5 4.2 4.0   No results for input(s): LIPASE, AMYLASE in the last 168 hours. No results for input(s): AMMONIA in the last 168 hours. CBC: Recent Labs  Lab 07/26/18 0355 07/27/18 0807 07/29/18 0617 07/29/18 2030 07/30/18 0900 07/31/18 1001  WBC 12.0* 18.4* 15.9*  --   PREF NAKAIA RN AWARE [address] 14.0*  NEUTROABS 7.1 15.1* 10.0*  --  PENDING  --   HGB 8.4* 6.7* 4.7* 6.2*  PREF NAKAIA RN AWARE 1209 PL 8.7*  HCT 25.7* 19.3* 13.6* 17.6*  PREF NAKAIA RN AWARE 120[address] PL 25.7*  MCV 86.8 82.5 80.0  --   PREF  NAKAIA RN AWARE 120[address] PL 90.8  PLT 452* 387 373  --   PREF NAKAIA RN AWARE [address] 473*   Cardiac Enzymes: No results for  input(s): CKTOTAL, CKMB, CKMBINDEX, TROPONINI in the last 168 hours. BNP: Invalid input(s): POCBNP CBG: No results for input(s): GLUCAP in the last 168 hours.  Time coordinating discharge: 50 minutes  Signed:    [name]  APRN, MSN, FNP-C [facility] [place], [place] [address] [phone]  [facility] 07/31/2018, 10:40 AM

## 2018-07-31 NOTE — Discharge Instructions (Signed)
°Resume all home medications.  °Follow up with PCP as previously  scheduled.  ° °Discussed the importance of drinking 64 ounces of water daily to  help prevent pain crises, it is important to drink plenty of water throughout the day. This is because dehydration of red blood cells may lead further sickling.  ° °Avoid all stressors that precipitate sickle cell pain crisis.   ° ° °The patient was given clear instructions to go to ER or return to medical center if symptoms do not improve, worsen or new problems develop.  ° ° ° °Sickle Cell Anemia, Adult °Sickle cell anemia is a condition where your red blood cells are shaped like sickles. Red blood cells carry oxygen through the body. Sickle-shaped cells do not live as long as normal red blood cells. They also clump together and block blood from flowing through the blood vessels. This prevents the body from getting enough oxygen. Sickle cell anemia causes organ damage and pain. It also increases the risk of infection. °Follow these instructions at home: °Medicines °· Take over-the-counter and prescription medicines only as told by your doctor. °· If you were prescribed an antibiotic medicine, take it as told by your doctor. Do not stop taking the antibiotic even if you start to feel better. °· If you develop a fever, do not take medicines to lower the fever right away. Tell your doctor about the fever. °Managing pain, stiffness, and swelling °· Try these methods to help with pain: °? Use a heating pad. °? Take a warm bath. °? Distract yourself, such as by watching TV. °Eating and drinking °· Drink enough fluid to keep your pee (urine) clear or pale yellow. Drink more in hot weather and during exercise. °· Limit or avoid alcohol. °· Eat a healthy diet. Eat plenty of fruits, vegetables, whole grains, and lean protein. °· Take vitamins and supplements as told by your doctor. °Traveling °· When traveling, keep these with you: °? Your medical information. °? The names of  your doctors. °? Your medicines. °· If you need to take an airplane, talk to your doctor first. °Activity °· Rest often. °· Avoid exercises that make your heart beat much faster, such as jogging. °General instructions °· Do not use products that have nicotine or tobacco, such as cigarettes and e-cigarettes. If you need help quitting, ask your doctor. °· Consider wearing a medical alert bracelet. °· Avoid being in high places (high altitudes), such as mountains. °· Avoid very hot or cold temperatures. °· Avoid places where the temperature changes a lot. °· Keep all follow-up visits as told by your doctor. This is important. °Contact a doctor if: °· A joint hurts. °· Your feet or hands hurt or swell. °· You feel tired (fatigued). °Get help right away if: °· You have symptoms of infection. These include: °? Fever. °? Chills. °? Being very tired. °? Irritability. °? Poor eating. °? Throwing up (vomiting). °· You feel dizzy or faint. °· You have new stomach pain, especially on the left side. °· You have a an erection (priapism) that lasts more than 4 hours. °· You have numbness in your arms or legs. °· You have a hard time moving your arms or legs. °· You have trouble talking. °· You have pain that does not go away when you take medicine. °· You are short of breath. °· You are breathing fast. °· You have a long-term cough. °· You have pain in your chest. °· You have a bad headache. °·   You have a stiff neck. °· Your stomach looks bloated even though you did not eat much. °· Your skin is pale. °· You suddenly cannot see well. °Summary °· Sickle cell anemia is a condition where your red blood cells are shaped like sickles. °· Follow your doctor's advice on ways to manage pain, food to eat, activities to do, and steps to take for safe travel. °· Get medical help right away if you have any signs of infection, such as a fever. °This information is not intended to replace advice given to you by your health care provider. Make  sure you discuss any questions you have with your health care provider. °Document Released: 11/21/2012 Document Revised: 03/08/2016 Document Reviewed: 03/08/2016 °Elsevier Interactive Patient Education © 2019 [employer] Inc[employer] ° °

## 2018-07-31 NOTE — Progress Notes (Signed)
[name] to be D/C'd Home per MD order.  Discussed prescriptions and follow up appointments with the patient. Prescriptions given to patient, medication list explained in detail. Pt verbalized understanding.  Allergies as of 07/31/2018      Reactions   Buprenorphine Hcl Hives   Levaquin [levofloxacin] Itching   Meperidine Rash   Morphine And Related Hives   Toradol [ketorolac Tromethamine] Itching   Tramadol Hives   Vancomycin Itching   Zosyn [piperacillin Sod-tazobactam So] Itching, Rash   Has taken rocephin in past      Medication List    TAKE these medications   aspirin EC 81 MG tablet Take 1 tablet (81 mg total) by mouth daily.   cholecalciferol 25 MCG (1000 UT) tablet Commonly known as: VITAMIN D Take 2 tablets (2,000 Units total) by mouth daily.   folic acid 1 MG tablet Commonly known as: FOLVITE Take 1 tablet (1 mg total) by mouth daily.   gabapentin 300 MG capsule Commonly known as: NEURONTIN TAKE 1 CAPSULE(300 MG) BY MOUTH THREE TIMES DAILY What changed: See the new instructions.   hydroxyurea 500 MG capsule Commonly known as: HYDREA Take 1 capsule (500 mg total) by mouth 2 (two) times daily. May take with food to minimize GI side effects.   ibuprofen 800 MG tablet Commonly known as: ADVIL Take 1 tablet (800 mg total) by mouth 3 (three) times daily as needed (pain).   oxyCODONE 20 mg 12 hr tablet Commonly known as: OXYCONTIN Take 1 tablet (20 mg total) by mouth every 12 (twelve) hours.   oxyCODONE-acetaminophen 10-325 MG tablet Commonly known as: PERCOCET Take 1 tablet by mouth every 6 (six) hours as needed for pain.       Vitals:   07/31/18 0755 07/31/18 0850  BP: 120/72   Pulse: 62   Resp: 12 11  Temp: 98.7 F (37.1 C)   SpO2: 98% 100%    Skin clean, dry and intact without evidence of skin break down, no evidence of skin tears noted. IV catheter discontinued intact. Site without signs and symptoms of complications. Dressing and pressure  applied. Pt denies pain at this time. No complaints noted.  An After Visit Summary was printed and given to the patient. Patient escorted via [place], and D/C home via private auto.  Nonie Hoyer S 07/31/2018 11:50 AM

## 2018-07-31 NOTE — Telephone Encounter (Signed)
Refilled

## 2018-08-13 ENCOUNTER — Telehealth: Payer: Self-pay

## 2018-08-13 DIAGNOSIS — D571 Sickle-cell disease without crisis: Secondary | ICD-10-CM

## 2018-08-13 MED ORDER — OXYCODONE-ACETAMINOPHEN 10-325 MG PO TABS: 1.0000 | ORAL_TABLET | Freq: Four times a day (QID) | ORAL | 0 refills | Status: DC | PRN

## 2018-08-13 NOTE — Telephone Encounter (Signed)
Refill request for oxycodone 10/325mg . Please advise. Thank you!

## 2018-08-14 ENCOUNTER — Other Ambulatory Visit: Payer: Self-pay | Admitting: Internal Medicine

## 2018-08-24 ENCOUNTER — Telehealth: Payer: Self-pay | Admitting: Family Medicine

## 2018-08-24 NOTE — Telephone Encounter (Signed)
Refill request. Please advise. Thanks!

## 2018-08-25 ENCOUNTER — Emergency Department (HOSPITAL_COMMUNITY)
Admission: EM | Admit: 2018-08-25 | Discharge: 2018-08-25 | Disposition: A | Discharge status: Home / Self Care | Payer: Medicaid Other | Attending: Emergency Medicine | Admitting: Emergency Medicine

## 2018-08-25 ENCOUNTER — Other Ambulatory Visit: Payer: Self-pay

## 2018-08-25 ENCOUNTER — Encounter (HOSPITAL_COMMUNITY): Payer: Self-pay | Admitting: *Deleted

## 2018-08-25 DIAGNOSIS — Z7982 Long term (current) use of aspirin: Secondary | ICD-10-CM | POA: Insufficient documentation

## 2018-08-25 DIAGNOSIS — G8929 Other chronic pain: Secondary | ICD-10-CM

## 2018-08-25 DIAGNOSIS — D5700 Hb-SS disease with crisis, unspecified: Secondary | ICD-10-CM | POA: Diagnosis present

## 2018-08-25 DIAGNOSIS — Z79899 Other long term (current) drug therapy: Secondary | ICD-10-CM | POA: Diagnosis not present

## 2018-08-25 DIAGNOSIS — Z79891 Long term (current) use of opiate analgesic: Secondary | ICD-10-CM | POA: Diagnosis not present

## 2018-08-25 MED ORDER — OXYCODONE-ACETAMINOPHEN 5-325 MG PO TABS
1.0000 | ORAL_TABLET | Freq: Once | ORAL | Status: AC
  Administered 2018-08-25: 1 via ORAL
  Filled 2018-08-25: qty 1

## 2018-08-25 MED ORDER — ONDANSETRON 4 MG PO TBDP
4.0000 mg | ORAL_TABLET | Freq: Once | ORAL | Status: AC
  Administered 2018-08-25: 4 mg via ORAL
  Filled 2018-08-25: qty 1

## 2018-08-25 MED ORDER — OXYCODONE HCL ER 10 MG PO T12A
20.0000 mg | EXTENDED_RELEASE_TABLET | Freq: Two times a day (BID) | ORAL | Status: DC
  Administered 2018-08-25: 20 mg via ORAL
  Filled 2018-08-25: qty 2

## 2018-08-25 MED ORDER — HYDROMORPHONE HCL 1 MG/ML IJ SOLN
1.0000 mg | Freq: Once | INTRAMUSCULAR | Status: AC
  Administered 2018-08-25: 1 mg via SUBCUTANEOUS
  Filled 2018-08-25: qty 1

## 2018-08-25 NOTE — ED Notes (Signed)
ED Provider at bedside. 

## 2018-08-25 NOTE — ED Provider Notes (Signed)
[facility] DEPT Provider Note   CSN: [id number] Arrival date & time: 08/25/18  1008     History   Chief Complaint Chief Complaint  Patient presents with  . Sickle Cell Pain Crisis    HPI Timothy Marks is a 28 y.o. male.     28 year old male with history of sickle cell disease presents with lower back discomfort.  States that he is been out of his pain medication for last 4 days.  Denies any fever, cough, congestion.  No urinary symptoms.  No recent history of back trauma.  This is consistent with his prior sickle cell crisis.  Call the clinic and cannot have his medications refilled until next week.     Past Medical History:  Diagnosis Date  . Depression   . 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 Sickle cell anemia (HCC)   . Vitamin D deficiency y-6    Patient Active Problem List   Diagnosis Date Noted  . Sickle cell anemia with crisis ([place]) 05/22/2018  . Acute bilateral low back pain without sciatica   . Other chronic pain   . Anemia of chronic disease   . 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 Physical deconditioning   . Agitation   . Chronic pain syndrome   . Recurrent cold sores   . Fever   . Pain   . Sickle cell crisis ([place]) 07/16/2017  . Adjustment disorder with mixed disturbance of emotions and conduct   . Constipation 08/03/2014  . 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 Protein-calorie malnutrition, severe ([place]) 04/09/2014  . Major depressive disorder, recurrent, severe without psychotic features ([place])   . 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 Vitamin D deficiency   . Sickle cell anemia ([place]) 03/30/2014  . Hyperbilirubinemia 03/30/2014  . Hypokalemia 02/07/2014  . Aggressive behavior 01/25/2013  . Sickle cell anemia with pain ([place]) 01/24/2013  . Sickle cell pain crisis ([place]) 01/24/2013    Past Surgical History:  Procedure Laterality Date  .  CHOLECYSTECTOMY          Home Medications    Prior to Admission medications   Medication Sig Start Date End Date Taking? Authorizing Provider  aspirin EC 81 MG tablet Take 1 tablet (81 mg total) by mouth daily. 06/02/17   Dorena Dew, FNP  cholecalciferol (VITAMIN D) 1000 units tablet Take 2 tablets (2,000 Units total) by mouth daily. 12/13/17   Azzie Glatter, FNP  folic acid (FOLVITE) 1 MG tablet Take 1 tablet (1 mg total) by mouth daily. 05/01/17   Dorena Dew, FNP  gabapentin (NEURONTIN) 300 MG capsule TAKE 1 CAPSULE(300 MG) BY MOUTH THREE TIMES DAILY Patient taking differently: Take 300 mg by mouth 3 (three) times daily.  05/08/18   Lanae Boast, FNP  hydroxyurea (HYDREA) 500 MG capsule Take 1 capsule (500 mg total) by mouth 2 (two) times daily. May take with food to minimize GI side effects. 05/01/17   Dorena Dew, FNP  ibuprofen (ADVIL,MOTRIN) 800 MG tablet Take 1 tablet (800 mg total) by mouth 3 (three) times daily as needed (pain). 12/20/17   Azzie Glatter, FNP  oxyCODONE (OXYCONTIN) 20 mg 12 hr tablet Take 1 tablet (20 mg total) by mouth every 12 (twelve) hours. 07/31/18   Tresa Garter, MD  oxyCODONE-acetaminophen (PERCOCET) 10-325 MG tablet Take 1 tablet by mouth every 6 (six) hours as needed for up to 15  days for pain. 08/14/18 08/29/18  Lanae Boast, FNP    Family History Family History  Problem Relation Age of Onset  . Sickle cell trait Mother   . Sickle cell trait Father     Social History Social History   Tobacco Use  . Smoking status: Never Smoker  . Smokeless tobacco: Never Used  Substance Use Topics  . Alcohol use: No  . Drug use: Not Currently    Types: Marijuana     Allergies   Buprenorphine hcl, Levaquin [levofloxacin], Meperidine, Morphine and related, Toradol [ketorolac tromethamine], Tramadol, Vancomycin, and Zosyn [piperacillin sod-tazobactam so]   Review of Systems Review of Systems  All other systems reviewed and  are negative.    Physical Exam Updated Vital Signs BP 100/68   Pulse 69   Temp 98.2 F (36.8 C) (Oral)   Resp 18   SpO2 100%   Physical Exam Vitals signs and nursing note reviewed.  Constitutional:      General: He is not in acute distress.    Appearance: Normal appearance. He is well-developed. He is not toxic-appearing.  HENT:     Head: Normocephalic and atraumatic.  Eyes:     General: Lids are normal.     Conjunctiva/sclera: Conjunctivae normal.     Pupils: Pupils are equal, round, and reactive to light.  Neck:     Musculoskeletal: Normal range of motion and neck supple.     Thyroid: No thyroid mass.     Trachea: No tracheal deviation.  Cardiovascular:     Rate and Rhythm: Normal rate and regular rhythm.     Heart sounds: Normal heart sounds. No murmur. No gallop.   Pulmonary:     Effort: Pulmonary effort is normal. No respiratory distress.     Breath sounds: Normal breath sounds. No stridor. No decreased breath sounds, wheezing, rhonchi or rales.  Abdominal:     General: Bowel sounds are normal. There is no distension.     Palpations: Abdomen is soft.     Tenderness: There is no abdominal tenderness. There is no rebound.  Musculoskeletal: Normal range of motion.        General: No tenderness.  Skin:    General: Skin is warm and dry.     Findings: No abrasion or rash.  Neurological:     Mental Status: He is alert and oriented to person, place, and time.     GCS: GCS eye subscore is 4. GCS verbal subscore is 5. GCS motor subscore is 6.     Cranial Nerves: No cranial nerve deficit.     Sensory: No sensory deficit.  Psychiatric:        Speech: Speech normal.        Behavior: Behavior normal.      ED Treatments / Results  Labs (all labs ordered are listed, but only abnormal results are displayed) Labs Reviewed - No data to display  EKG None  Radiology No results found.  Procedures Procedures (including critical care time)  Medications Ordered in ED  Medications  oxyCODONE (OXYCONTIN) 12 hr tablet 20 mg (has no administration in time range)  HYDROmorphone (DILAUDID) injection 1 mg (has no administration in time range)     Initial Impression / Assessment and Plan / ED Course  I have reviewed the triage vital signs and the nursing notes.  Pertinent labs & imaging results that were available during my care of the patient were reviewed by me and considered in my medical decision making (see chart  for details).        Patient with likely chronic pain exacerbation.  Was medicated with subcu Dilaudid along with his home medications.  Discussed with Dr. Doreene Burke and he will follow-up with the patient in clinic on Monday.  Patient is comfortable with this plan  Final Clinical Impressions(s) / ED Diagnoses   Final diagnoses:  None    ED Discharge Orders    None       Lacretia Leigh, MD 08/25/18 1446

## 2018-08-25 NOTE — ED Triage Notes (Addendum)
SCC pain started yesterday. Home meds not relieving the pain. Pt testing during triage without noted distress.

## 2018-08-25 NOTE — Discharge Instructions (Addendum)
Follow-up with your doctor on Monday 

## 2018-08-27 ENCOUNTER — Telehealth: Payer: Self-pay

## 2018-08-27 DIAGNOSIS — D571 Sickle-cell disease without crisis: Secondary | ICD-10-CM

## 2018-08-27 MED ORDER — OXYCODONE HCL ER 20 MG PO T12A: 20.0000 mg | EXTENDED_RELEASE_TABLET | Freq: Two times a day (BID) | ORAL | 0 refills | Status: DC

## 2018-08-27 MED ORDER — OXYCODONE-ACETAMINOPHEN 10-325 MG PO TABS: 1.0000 | ORAL_TABLET | Freq: Four times a day (QID) | ORAL | 0 refills | Status: DC | PRN

## 2018-08-27 NOTE — Telephone Encounter (Signed)
Reviewed [place] Substance Reporting system prior to prescribing opiate medications. No inconsistencies noted.   

## 2018-08-30 ENCOUNTER — Telehealth: Payer: Self-pay

## 2018-08-30 DIAGNOSIS — D571 Sickle-cell disease without crisis: Secondary | ICD-10-CM

## 2018-08-30 MED ORDER — OXYCODONE HCL ER 20 MG PO T12A: 20.0000 mg | EXTENDED_RELEASE_TABLET | Freq: Two times a day (BID) | ORAL | 0 refills | Status: DC

## 2018-08-30 MED ORDER — OXYCODONE-ACETAMINOPHEN 10-325 MG PO TABS: 1.0000 | ORAL_TABLET | Freq: Four times a day (QID) | ORAL | 0 refills | Status: DC | PRN

## 2018-08-30 NOTE — Telephone Encounter (Signed)
Called and spoke with Aniceto Boss at [facility] on [address] and patient has picked up these medications Oxycodone 20mg  today and oxycodone 10/325mg  on 08/27/2018 from their store. I then called [facility] on [address] in [place] and spoke with [place] to cancel medications that were sent today for oxycodone 20mg  and oxycodone 10/325mg . Thanks!

## 2018-08-31 ENCOUNTER — Other Ambulatory Visit: Payer: Self-pay

## 2018-08-31 ENCOUNTER — Ambulatory Visit (INDEPENDENT_AMBULATORY_CARE_PROVIDER_SITE_OTHER): Payer: Medicaid Other | Admitting: Family Medicine

## 2018-08-31 ENCOUNTER — Encounter: Payer: Self-pay | Admitting: Family Medicine

## 2018-08-31 VITALS — BP 125/66 | HR 73 | Temp 98.6°F | Resp 14 | Ht 74.0 in | Wt 118.0 lb

## 2018-08-31 DIAGNOSIS — R634 Abnormal weight loss: Secondary | ICD-10-CM

## 2018-08-31 DIAGNOSIS — D571 Sickle-cell disease without crisis: Secondary | ICD-10-CM

## 2018-08-31 LAB — POCT URINALYSIS DIPSTICK
Glucose, UA: NEGATIVE
Ketones, UA: NEGATIVE
Leukocytes, UA: NEGATIVE
Nitrite, UA: NEGATIVE
Protein, UA: POSITIVE — AB
Spec Grav, UA: 1.02 (ref 1.010–1.025)
Urobilinogen, UA: >=8 E.U./dL — AB
pH, UA: 7 (ref 5.0–8.0)

## 2018-08-31 NOTE — Patient Instructions (Signed)
Sickle Cell Anemia, Adult ° °Sickle cell anemia is a condition where your red blood cells are shaped like sickles. Red blood cells carry oxygen through the body. Sickle-shaped cells do not live as long as normal red blood cells. They also clump together and block blood from flowing through the blood vessels. This prevents the body from getting enough oxygen. Sickle cell anemia causes organ damage and pain. It also increases the risk of infection. °Follow these instructions at home: °Medicines °· Take over-the-counter and prescription medicines only as told by your doctor. °· If you were prescribed an antibiotic medicine, take it as told by your doctor. Do not stop taking the antibiotic even if you start to feel better. °· If you develop a fever, do not take medicines to lower the fever right away. Tell your doctor about the fever. °Managing pain, stiffness, and swelling °· Try these methods to help with pain: °? Use a heating pad. °? Take a warm bath. °? Distract yourself, such as by watching TV. °Eating and drinking °· Drink enough fluid to keep your pee (urine) clear or pale yellow. Drink more in hot weather and during exercise. °· Limit or avoid alcohol. °· Eat a healthy diet. Eat plenty of fruits, vegetables, whole grains, and lean protein. °· Take vitamins and supplements as told by your doctor. °Traveling °· When traveling, keep these with you: °? Your medical information. °? The names of your doctors. °? Your medicines. °· If you need to take an airplane, talk to your doctor first. °Activity °· Rest often. °· Avoid exercises that make your heart beat much faster, such as jogging. °General instructions °· Do not use products that have nicotine or tobacco, such as cigarettes and e-cigarettes. If you need help quitting, ask your doctor. °· Consider wearing a medical alert bracelet. °· Avoid being in high places (high altitudes), such as mountains. °· Avoid very hot or cold temperatures. °· Avoid places where the  temperature changes a lot. °· Keep all follow-up visits as told by your doctor. This is important. °Contact a doctor if: °· A joint hurts. °· Your feet or hands hurt or swell. °· You feel tired (fatigued). °Get help right away if: °· You have symptoms of infection. These include: °? Fever. °? Chills. °? Being very tired. °? Irritability. °? Poor eating. °? Throwing up (vomiting). °· You feel dizzy or faint. °· You have new stomach pain, especially on the left side. °· You have a an erection (priapism) that lasts more than 4 hours. °· You have numbness in your arms or legs. °· You have a hard time moving your arms or legs. °· You have trouble talking. °· You have pain that does not go away when you take medicine. °· You are short of breath. °· You are breathing fast. °· You have a long-term cough. °· You have pain in your chest. °· You have a bad headache. °· You have a stiff neck. °· Your stomach looks bloated even though you did not eat much. °· Your skin is pale. °· You suddenly cannot see well. °Summary °· Sickle cell anemia is a condition where your red blood cells are shaped like sickles. °· Follow your doctor's advice on ways to manage pain, food to eat, activities to do, and steps to take for safe travel. °· Get medical help right away if you have any signs of infection, such as a fever. °This information is not intended to replace advice given to you by   your health care provider. Make sure you discuss any questions you have with your health care provider. °Document Released: 11/21/2012 Document Revised: 05/25/2018 Document Reviewed: 03/08/2016 °Elsevier Patient Education © 2020 Elsevier Inc. ° °

## 2018-08-31 NOTE — Progress Notes (Signed)
[facility] INTERNAL MEDICINE AND SICKLE CELL CARE  SICKLE CELL ANEMIA FOLLOW UP VISIT PROVIDER: Lanae Boast, FNP    Subjective:   Timothy Marks  is a 28 y.o.  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 presents for a follow up for Sickle Cell Anemia. The patient has had 4 admissions in the past 6 months.  Pain regimen includes: Ibuprofen, percocet and oxycontin Hydrea Therapy: Yes Medication compliance: Yes  Pain today is 0/10 The patient reports adequate daily hydration.  19 pound weight loss in the past month. Unintentional    Review of Systems  Constitutional: Positive for weight loss.  HENT: Negative.   Eyes: Negative.   Respiratory: Negative.   Cardiovascular: Negative.   Gastrointestinal: Negative.   Genitourinary: Negative.   Musculoskeletal: Negative.   Skin: Negative.   Neurological: Negative.   Psychiatric/Behavioral: Negative.     Objective:   Objective  BP 125/66 (BP Location: Left Arm, Patient Position: Sitting, Cuff Size: Normal)   Pulse 73   Temp 98.6 F (37 C) (Oral)   Resp 14   Ht 6\' 2"  (1.88 m)   Wt 118 lb (53.5 kg)   SpO2 99%   BMI 15.15 kg/m   Wt Readings from Last 3 Encounters:  08/31/18 118 lb (53.5 kg)  07/29/18 137 lb 5.6 oz (62.3 kg)  07/24/18 135 lb (61.2 kg)     Physical Exam Vitals signs and nursing note reviewed.  Constitutional:      General: He is not in acute distress.    Appearance: Normal appearance.  HENT:     Head: Normocephalic and atraumatic.  Eyes:     Extraocular Movements: Extraocular movements intact.     Conjunctiva/sclera: Conjunctivae normal.     Pupils: Pupils are equal, round, and reactive to light.  Cardiovascular:     Rate and Rhythm: Normal rate and regular rhythm.     Heart sounds: No murmur.  Pulmonary:     Effort: Pulmonary effort is normal.      Breath sounds: Normal breath sounds.  Musculoskeletal: Normal range of motion.  Skin:    General: Skin is warm and dry.  Neurological:     Mental Status: He is alert and oriented to person, place, and time.  Psychiatric:        Mood and Affect: Mood normal.        Behavior: Behavior normal.        Thought Content: Thought content normal.        Judgment: Judgment normal.      Assessment/Plan:   Assessment   Encounter Diagnoses  Name Primary?  Marland Kitchen Hb-SS disease without crisis ([place]) Yes  . Weight loss      Plan  1. Hb-SS disease without crisis ([place]) - Urinalysis Dipstick - CBC with Differential  2. Weight loss - Thyroid Panel With TSH - Comprehensive metabolic panel - HIV antibody (with reflex)    Return to care as scheduled and prn. Patient verbalized understanding and agreed with plan of care.   1. Sickle cell disease - Continue Hydrea   We discussed the need for good hydration, monitoring of hydration status, avoidance of heat, cold, stress, and infection triggers. We discussed the risks and benefits of Hydrea, including bone marrow suppression, the possibility of GI upset, skin ulcers, hair thinning, and teratogenicity. The patient was reminded of the need to seek  medical attention of any symptoms of bleeding, anemia, or infection. Continue folic acid 1 mg daily to prevent aplastic bone marrow crises.   2. Pulmonary evaluation - Patient denies severe recurrent wheezes, shortness of breath with exercise, or persistent cough. If these symptoms develop, pulmonary function tests with spirometry will be ordered, and if abnormal, plan on referral to Pulmonology for further evaluation.  3. Cardiac - Routine screening for pulmonary hypertension is not recommended.  4. Eye - High risk of proliferative retinopathy. Annual eye exam with retinal exam recommended to patient.  5. Immunization status -  Yearly influenza vaccination is recommended, as well as being up to date with  Meningococcal and Pneumococcal vaccines.   6. Acute and chronic painful episodes - We discussed that pt is to receive Schedule II prescriptions only from [place]. Pt is also aware that the prescription history is available to [place] online through the [facility] CSRS. Controlled substance agreement signed. We reminded [place] that all patients receiving Schedule II narcotics must be seen for follow within one month of prescription being requested. We reviewed the terms of our pain agreement, including the need to keep medicines in a safe locked location away from children or pets, and the need to report excess sedation or constipation, measures to avoid constipation, and policies related to early refills and stolen prescriptions. According to the [place] [facility] program, we have reviewed details related to analgesia, adverse effects, aberrant behaviors.  7. Iron overload from chronic transfusion.  Not applicable at this time.  If this occurs will use Exjade for management.   8. Vitamin D deficiency - Drisdol 50,000 units weekly. Patient encouraged to take as prescribed.   The above recommendations are taken from the NIH Evidence-Based Management of Sickle Cell Disease: Expert Panel Report, 20149.   [name], FNP-BC [facility] [address] [place], [place] [address] [phone]  This note has been created with Dragon speech recognition software and smart phrase technology. Any transcriptional errors are unintentional.

## 2018-09-01 LAB — CBC WITH DIFFERENTIAL/PLATELET
Basophils Absolute: 0.1 10*3/uL (ref 0.0–0.2)
Basos: 1 %
EOS (ABSOLUTE): 0.3 10*3/uL (ref 0.0–0.4)
Eos: 4 %
Hematocrit: 35.7 % — ABNORMAL LOW (ref 37.5–51.0)
Hemoglobin: 12 g/dL — ABNORMAL LOW (ref 13.0–17.7)
Immature Grans (Abs): 0 10*3/uL (ref 0.0–0.1)
Immature Granulocytes: 0 %
Lymphocytes Absolute: 2 10*3/uL (ref 0.7–3.1)
Lymphs: 26 %
MCH: 28.4 pg (ref 26.6–33.0)
MCHC: 33.6 g/dL (ref 31.5–35.7)
MCV: 84 fL (ref 79–97)
Monocytes Absolute: 0.8 10*3/uL (ref 0.1–0.9)
Monocytes: 10 %
NRBC: 1 % — ABNORMAL HIGH (ref 0–0)
Neutrophils Absolute: 4.7 10*3/uL (ref 1.4–7.0)
Neutrophils: 59 %
Platelets: 439 10*3/uL (ref 150–450)
RBC: 4.23 x10E6/uL (ref 4.14–5.80)
RDW: 17.2 % — ABNORMAL HIGH (ref 11.6–15.4)
WBC: 7.9 10*3/uL (ref 3.4–10.8)

## 2018-09-01 LAB — COMPREHENSIVE METABOLIC PANEL
ALT: 7 IU/L (ref 0–44)
AST: 18 IU/L (ref 0–40)
Albumin/Globulin Ratio: 1.6 (ref 1.2–2.2)
Albumin: 4.6 g/dL (ref 4.1–5.2)
Alkaline Phosphatase: 76 IU/L (ref 39–117)
BUN/Creatinine Ratio: 8 — ABNORMAL LOW (ref 9–20)
BUN: 6 mg/dL (ref 6–20)
Bilirubin Total: 3.1 mg/dL — ABNORMAL HIGH (ref 0.0–1.2)
CO2: 22 mmol/L (ref 20–29)
Calcium: 9.5 mg/dL (ref 8.7–10.2)
Chloride: 101 mmol/L (ref 96–106)
Creatinine, Ser: 0.76 mg/dL (ref 0.76–1.27)
GFR calc Af Amer: 143 mL/min/{1.73_m2} (ref >59)
GFR calc non Af Amer: 123 mL/min/{1.73_m2} (ref >59)
Globulin, Total: 2.8 g/dL (ref 1.5–4.5)
Glucose: 86 mg/dL (ref 65–99)
Potassium: 4.2 mmol/L (ref 3.5–5.2)
Sodium: 139 mmol/L (ref 134–144)
Total Protein: 7.4 g/dL (ref 6.0–8.5)

## 2018-09-01 LAB — THYROID PANEL WITH TSH
Free Thyroxine Index: 2.2 (ref 1.2–4.9)
T3 Uptake Ratio: 27 % (ref 24–39)
T4, Total: 8.1 ug/dL (ref 4.5–12.0)
TSH: 0.884 u[IU]/mL (ref 0.450–4.500)

## 2018-09-01 LAB — HIV ANTIBODY (ROUTINE TESTING W REFLEX): HIV Screen 4th Generation wRfx: NONREACTIVE

## 2018-09-10 ENCOUNTER — Encounter (HOSPITAL_COMMUNITY): Payer: Self-pay | Admitting: General Practice

## 2018-09-10 ENCOUNTER — Telehealth (HOSPITAL_COMMUNITY): Payer: Self-pay | Admitting: General Practice

## 2018-09-10 ENCOUNTER — Non-Acute Institutional Stay (HOSPITAL_COMMUNITY)
Admission: AD | Admit: 2018-09-10 | Discharge: 2018-09-10 | Disposition: A | Discharge status: Home / Self Care | Payer: Medicaid Other | Source: Ambulatory Visit | Attending: Internal Medicine | Admitting: Internal Medicine

## 2018-09-10 DIAGNOSIS — F329 Major depressive disorder, single episode, unspecified: Secondary | ICD-10-CM | POA: Insufficient documentation

## 2018-09-10 DIAGNOSIS — M79604 Pain in right leg: Secondary | ICD-10-CM | POA: Diagnosis not present

## 2018-09-10 DIAGNOSIS — Z881 Allergy status to other antibiotic agents status: Secondary | ICD-10-CM | POA: Insufficient documentation

## 2018-09-10 DIAGNOSIS — Z888 Allergy status to other drugs, medicaments and biological substances status: Secondary | ICD-10-CM | POA: Insufficient documentation

## 2018-09-10 DIAGNOSIS — Z885 Allergy status to narcotic agent status: Secondary | ICD-10-CM | POA: Insufficient documentation

## 2018-09-10 DIAGNOSIS — D638 Anemia in other chronic diseases classified elsewhere: Secondary | ICD-10-CM | POA: Insufficient documentation

## 2018-09-10 DIAGNOSIS — F1120 Opioid dependence, uncomplicated: Secondary | ICD-10-CM | POA: Diagnosis not present

## 2018-09-10 DIAGNOSIS — Z832 Family history of diseases of the blood and blood-forming organs and certain disorders involving the immune mechanism: Secondary | ICD-10-CM | POA: Diagnosis not present

## 2018-09-10 DIAGNOSIS — M79605 Pain in left leg: Secondary | ICD-10-CM | POA: Insufficient documentation

## 2018-09-10 DIAGNOSIS — G894 Chronic pain syndrome: Secondary | ICD-10-CM | POA: Diagnosis not present

## 2018-09-10 DIAGNOSIS — D5700 Hb-SS disease with crisis, unspecified: Secondary | ICD-10-CM | POA: Diagnosis present

## 2018-09-10 DIAGNOSIS — M545 Low back pain: Secondary | ICD-10-CM | POA: Diagnosis not present

## 2018-09-10 DIAGNOSIS — Z880 Allergy status to penicillin: Secondary | ICD-10-CM | POA: Diagnosis not present

## 2018-09-10 DIAGNOSIS — Z9049 Acquired absence of other specified parts of digestive tract: Secondary | ICD-10-CM | POA: Insufficient documentation

## 2018-09-10 LAB — RETICULOCYTES
Immature Retic Fract: 34.7 % — ABNORMAL HIGH (ref 2.3–15.9)
RBC.: 3.59 MIL/uL — ABNORMAL LOW (ref 4.22–5.81)
Retic Count, Absolute: 156.5 10*3/uL (ref 19.0–186.0)
Retic Ct Pct: 4.4 % — ABNORMAL HIGH (ref 0.4–3.1)

## 2018-09-10 LAB — COMPREHENSIVE METABOLIC PANEL
ALT: 11 U/L (ref 0–44)
AST: 20 U/L (ref 15–41)
Albumin: 4.5 g/dL (ref 3.5–5.0)
Alkaline Phosphatase: 64 U/L (ref 38–126)
Anion gap: 8 (ref 5–15)
BUN: 7 mg/dL (ref 6–20)
CO2: 26 mmol/L (ref 22–32)
Calcium: 9 mg/dL (ref 8.9–10.3)
Chloride: 106 mmol/L (ref 98–111)
Creatinine, Ser: 0.47 mg/dL — ABNORMAL LOW (ref 0.61–1.24)
GFR calc Af Amer: >60 mL/min (ref >60)
GFR calc non Af Amer: >60 mL/min (ref >60)
Glucose, Bld: 77 mg/dL (ref 70–99)
Potassium: 3.4 mmol/L — ABNORMAL LOW (ref 3.5–5.1)
Sodium: 140 mmol/L (ref 135–145)
Total Bilirubin: 3.7 mg/dL — ABNORMAL HIGH (ref 0.3–1.2)
Total Protein: 7.8 g/dL (ref 6.5–8.1)

## 2018-09-10 LAB — CBC WITH DIFFERENTIAL/PLATELET
Abs Immature Granulocytes: 0.05 10*3/uL (ref 0.00–0.07)
Basophils Absolute: 0 10*3/uL (ref 0.0–0.1)
Basophils Relative: 0 %
Eosinophils Absolute: 0.1 10*3/uL (ref 0.0–0.5)
Eosinophils Relative: 1 %
HCT: 31.1 % — ABNORMAL LOW (ref 39.0–52.0)
Hemoglobin: 10.7 g/dL — ABNORMAL LOW (ref 13.0–17.0)
Immature Granulocytes: 1 %
Lymphocytes Relative: 24 %
Lymphs Abs: 2.2 10*3/uL (ref 0.7–4.0)
MCH: 29.8 pg (ref 26.0–34.0)
MCHC: 34.4 g/dL (ref 30.0–36.0)
MCV: 86.6 fL (ref 80.0–100.0)
Monocytes Absolute: 0.8 10*3/uL (ref 0.1–1.0)
Monocytes Relative: 8 %
Neutro Abs: 5.9 10*3/uL (ref 1.7–7.7)
Neutrophils Relative %: 66 %
Platelets: 450 10*3/uL — ABNORMAL HIGH (ref 150–400)
RBC: 3.59 MIL/uL — ABNORMAL LOW (ref 4.22–5.81)
RDW: 19.5 % — ABNORMAL HIGH (ref 11.5–15.5)
WBC: 9.1 10*3/uL (ref 4.0–10.5)
nRBC: 0.7 % — ABNORMAL HIGH (ref 0.0–0.2)

## 2018-09-10 MED ORDER — ONDANSETRON HCL 4 MG/2ML IJ SOLN: 4.0000 mg | Freq: Four times a day (QID) | INTRAMUSCULAR | Status: DC | PRN

## 2018-09-10 MED ORDER — DIPHENHYDRAMINE HCL 25 MG PO CAPS: 25.0000 mg | ORAL_CAPSULE | ORAL | Status: DC | PRN

## 2018-09-10 MED ORDER — OXYCODONE HCL 5 MG PO TABS
20.0000 mg | ORAL_TABLET | Freq: Once | ORAL | Status: AC
  Administered 2018-09-10: 20 mg via ORAL
  Filled 2018-09-10: qty 4

## 2018-09-10 MED ORDER — ACETAMINOPHEN 500 MG PO TABS
1000.0000 mg | ORAL_TABLET | Freq: Once | ORAL | Status: AC
  Administered 2018-09-10: 11:00:00 1000 mg via ORAL
  Filled 2018-09-10: qty 2

## 2018-09-10 MED ORDER — SODIUM CHLORIDE 0.9% FLUSH: 9.0000 mL | INTRAVENOUS | Status: DC | PRN

## 2018-09-10 MED ORDER — NALOXONE HCL 0.4 MG/ML IJ SOLN: 0.4000 mg | INTRAMUSCULAR | Status: DC | PRN

## 2018-09-10 MED ORDER — SODIUM CHLORIDE 0.9 % IV SOLN
25.0000 mg | INTRAVENOUS | Status: DC | PRN
  Filled 2018-09-10: qty 0.5

## 2018-09-10 MED ORDER — SODIUM CHLORIDE 0.45 % IV SOLN
INTRAVENOUS | Status: DC
  Administered 2018-09-10: 12:00:00 via INTRAVENOUS

## 2018-09-10 MED ORDER — HYDROMORPHONE 1 MG/ML IV SOLN
INTRAVENOUS | Status: DC
  Administered 2018-09-10: 30 mg via INTRAVENOUS
  Administered 2018-09-10: 9.5 mg via INTRAVENOUS
  Filled 2018-09-10: qty 30

## 2018-09-10 NOTE — H&P (Signed)
[facility] History and Physical   Date: 09/11/2018  Patient name: Timothy Marks Medical record number: [id number] Date of birth: November 16, 1990 Age: 28 y.o. Gender: male PCP: Lanae Boast, [place]  Attending physician: No att. providers found  Chief Complaint: Sickle cell pain  History of Present Illness: Jahquan Klugh, a 28 year old male with a medical history significant for sickle cell disease, type SS, chronic pain syndrome, opiate dependence, history of adjustment disorder, and history of anemia of chronic disease presents complaining of low back and lower extremity pain that is consistent with typical sickle cell pain crisis.  Patient states that pain intensity increased 2 days ago and he attributes pain crisis to changes in weather.  He states that he has been taking OxyContin and Percocet consistently without sustained relief.  He last had OxyContin around 6 this a.m.  Pain intensity is 8-9/10 characterized as constant and throbbing. Patient denies fever, chills, sore throat, persistent cough, recent travel, sick contacts, or exposure to COVID-19. He also denies dizziness, paresthesias, shortness of breath, dysuria, nausea, vomiting, or diarrhea.  Meds: No medications prior to admission.    Allergies: Buprenorphine hcl, Levaquin [levofloxacin], Meperidine, Morphine and related, Toradol [ketorolac tromethamine], Tramadol, Vancomycin, and Zosyn [piperacillin sod-tazobactam so] Past Medical History:  Diagnosis Date  . Depression   . 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 Sickle cell anemia (HCC)   . Vitamin D deficiency y-6   Past Surgical History:  Procedure Laterality Date  . CHOLECYSTECTOMY     Family History  Problem Relation Age of Onset  . Sickle cell trait Mother   . Sickle cell trait Father    Social History   Socioeconomic History  . Marital status: Single    Spouse name: Not on file  .  Number of children: Not on file  . Years of education: Not on file  . Highest education level: Not on file  Occupational History  . Not on file  Social Needs  . Financial resource strain: Not on file  . Food insecurity    Worry: Not on file    Inability: Not on file  . Transportation needs    Medical: Not on file    Non-medical: Not on file  Tobacco Use  . Smoking status: Never Smoker  . Smokeless tobacco: Never Used  Substance and Sexual Activity  . Alcohol use: No  . Drug use: Not Currently    Types: Marijuana  . Sexual activity: Not on file  Lifestyle  . Physical activity    Days per week: Not on file    Minutes per session: Not on file  . Stress: Not on file  Relationships  . Social Herbalist on phone: Not on file    Gets together: Not on file    Attends religious service: Not on file    Active member of club or organization: Not on file    Attends meetings of clubs or organizations: Not on file    Relationship status: Not on file  . Intimate partner violence    Fear of current or ex partner: Not on file    Emotionally abused: Not on file    Physically abused: Not on file    Forced sexual activity: Not on file  Other Topics Concern  . Not on file  Social History Narrative  . Not on file    Review of Systems: Review of Systems  Constitutional:  Negative for chills and fever.  HENT: Negative.   Eyes: Negative.   Respiratory: Negative.  Negative for cough and hemoptysis.   Cardiovascular: Negative.  Negative for chest pain and palpitations.  Gastrointestinal: Negative.   Genitourinary: Negative.   Musculoskeletal: Positive for back pain and joint pain.  Skin: Negative.   Neurological: Negative.   Endo/Heme/Allergies: Negative.   Psychiatric/Behavioral: Negative for depression and suicidal ideas.    Physical Exam: Blood pressure (!) 105/54, pulse 63, temperature 97.8 F (36.6 C), temperature source Oral, resp. rate 16, SpO2 94 %. Physical  Exam Constitutional:      Appearance: He is normal weight.  HENT:     Head: Normocephalic.     Nose: Nose normal.     Mouth/Throat:     Mouth: Mucous membranes are moist.  Cardiovascular:     Rate and Rhythm: Normal rate and regular rhythm.     Pulses: Normal pulses.     Heart sounds: Normal heart sounds.  Pulmonary:     Effort: Pulmonary effort is normal.     Breath sounds: Normal breath sounds.  Abdominal:     General: Bowel sounds are normal.  Musculoskeletal: Normal range of motion.  Skin:    General: Skin is warm.  Neurological:     General: No focal deficit present.     Mental Status: He is alert. Mental status is at baseline.  Psychiatric:        Mood and Affect: Mood normal.        Behavior: Behavior normal.        Thought Content: Thought content normal.        Judgment: Judgment normal.     Lab results: Results for orders placed or performed during the hospital encounter of 09/10/18 (from the past 24 hour(s))  CBC with Differential/Platelet     Status: Abnormal   Collection Time: 09/10/18 10:20 AM  Result Value Ref Range   WBC 9.1 4.0 - 10.5 K/uL   RBC 3.59 (L) 4.22 - 5.81 MIL/uL   Hemoglobin 10.7 (L) 13.0 - 17.0 g/dL   HCT 31.1 (L) 39.0 - 52.0 %   MCV 86.6 80.0 - 100.0 fL   MCH 29.8 26.0 - 34.0 pg   MCHC 34.4 30.0 - 36.0 g/dL   RDW 19.5 (H) 11.5 - 15.5 %   Platelets 450 (H) 150 - 400 K/uL   nRBC 0.7 (H) 0.0 - 0.2 %   Neutrophils Relative % 66 %   Neutro Abs 5.9 1.7 - 7.7 K/uL   Lymphocytes Relative 24 %   Lymphs Abs 2.2 0.7 - 4.0 K/uL   Monocytes Relative 8 %   Monocytes Absolute 0.8 0.1 - 1.0 K/uL   Eosinophils Relative 1 %   Eosinophils Absolute 0.1 0.0 - 0.5 K/uL   Basophils Relative 0 %   Basophils Absolute 0.0 0.0 - 0.1 K/uL   Immature Granulocytes 1 %   Abs Immature Granulocytes 0.05 0.00 - 0.07 K/uL  Reticulocytes     Status: Abnormal   Collection Time: 09/10/18 10:20 AM  Result Value Ref Range   Retic Ct Pct 4.4 (H) 0.4 - 3.1 %   RBC.  3.59 (L) 4.22 - 5.81 MIL/uL   Retic Count, Absolute 156.5 19.0 - 186.0 K/uL   Immature Retic Fract 34.7 (H) 2.3 - 15.9 %  Comprehensive metabolic panel     Status: Abnormal   Collection Time: 09/10/18 10:20 AM  Result Value Ref Range   Sodium 140 135 - 145 mmol/L  Potassium 3.4 (L) 3.5 - 5.1 mmol/L   Chloride 106 98 - 111 mmol/L   CO2 26 22 - 32 mmol/L   Glucose, Bld 77 70 - 99 mg/dL   BUN 7 6 - 20 mg/dL   Creatinine, Ser 0.47 (L) 0.61 - 1.24 mg/dL   Calcium 9.0 8.9 - 10.3 mg/dL   Total Protein 7.8 6.5 - 8.1 g/dL   Albumin 4.5 3.5 - 5.0 g/dL   AST 20 15 - 41 U/L   ALT 11 0 - 44 U/L   Alkaline Phosphatase 64 38 - 126 U/L   Total Bilirubin 3.7 (H) 0.3 - 1.2 mg/dL   GFR calc non Af Amer >60 >60 mL/min   GFR calc Af Amer >60 >60 mL/min   Anion gap 8 5 - 15    Imaging results:  No results found.   Assessment & Plan: Patient's pain persists despite home medications.  He is admitted to sickle cell day infusion center for management of pain crisis.  Patient is opiate tolerant. Initiate 0.45% saline at 100 ml/hour for cellular rehydration Toradol 15 mg IV times one for inflammation. Tylenol 1000 mg times one Start Dilaudid PCA High Concentration per weight based protocol.    Settings of 0.5 mg, 10-minute lockout, and 3 mg/h Patient will be re-evaluated for pain intensity in the context of function and relationship to baseline as care progresses. If no significant pain relief, will transfer patient to inpatient services for a higher level of care.   Review CBC with differential, CMP and reticulocytes as results become available    [name]  APRN, MSN, FNP-C [facility] [address] [place], [place] [address] [phone]  09/11/2018, 6:35 AM

## 2018-09-10 NOTE — BH Specialist Note (Addendum)
3:30 pm RN indicated patient was feeling down and frustrated. CSW met with patient again at bedside. Patient somewhat drowsy and dozed off during conversation. Patient talked about his son being in foster care and expressed some frustrations related to that. Provided some brief supportive counseling. Patient did not express any additional concerns or needs when prompted by CSW. Advised patient that CSW is available at clinic as needed.   11:50 am CSW met with patient in the day hospital. Introduced self and role at the [facility]. Advised that CSW available for support and resources. Patient had no concerns today.   Estanislado Emms, [facility] [phone]

## 2018-09-10 NOTE — Progress Notes (Signed)
Patient admitted to the day infusion hospital for sickle cell pain crisis. Initially, patient reported bilateral leg and back pain rated 8/10. For pain management, patient placed on Dilaudid PCA, given 1000 mg Tylenol, 20 mg Oxycodone and hydrated with IV fluids. At discharge, patient rated pain at 5/10. Vital signs stable. Discharge instructions given. Patient alert, oriented and ambulatory at discharge.

## 2018-09-10 NOTE — Telephone Encounter (Signed)
Patient called, complained of pain in the back and legs rated at 8/10. Denied chest pain, fever, diarrhea, abdominal pain, nausea/vomitting. Screened negative for Covid-19 symptoms. Admitted to having means of transportation without driving self after treatment. Last took 10 mg at 23:00 yesterday and 20 mg of Oxycontin at 07:00 today.  Per provider, patient can come to the day hospital for treatment. Patient notified, verbalized understanding.

## 2018-09-10 NOTE — Discharge Summary (Signed)
[facility] Discharge Summary   Patient ID: Timothy Marks MRN: [id number] DOB/AGE: 1990/11/27 28 y.o.  Admit date: 09/10/2018 Discharge date: 09/11/2018  Primary Care Physician:  Lanae Boast, [place]  Admission Diagnoses:  Active Problems:   Sickle cell pain crisis [facility])   Discharge Medications:  Allergies as of 09/10/2018      Reactions   Buprenorphine Hcl Hives   Levaquin [levofloxacin] Itching   Meperidine Rash   Morphine And Related Hives   Toradol [ketorolac Tromethamine] Itching   Tramadol Hives   Vancomycin Itching   Zosyn [piperacillin Sod-tazobactam So] Itching, Rash   Has taken rocephin in past      Medication List    TAKE these medications   aspirin EC 81 MG tablet Take 1 tablet (81 mg total) by mouth daily.   cholecalciferol 25 MCG (1000 UT) tablet Commonly known as: VITAMIN D Take 2 tablets (2,000 Units total) by mouth daily.   folic acid 1 MG tablet Commonly known as: FOLVITE Take 1 tablet (1 mg total) by mouth daily.   gabapentin 300 MG capsule Commonly known as: NEURONTIN TAKE 1 CAPSULE(300 MG) BY MOUTH THREE TIMES DAILY What changed: See the new instructions.   hydroxyurea 500 MG capsule Commonly known as: HYDREA Take 1 capsule (500 mg total) by mouth 2 (two) times daily. May take with food to minimize GI side effects.   ibuprofen 800 MG tablet Commonly known as: ADVIL Take 1 tablet (800 mg total) by mouth 3 (three) times daily as needed (pain).   oxyCODONE 20 mg 12 hr tablet Commonly known as: OXYCONTIN Take 1 tablet (20 mg total) by mouth every 12 (twelve) hours.   oxyCODONE-acetaminophen 10-325 MG tablet Commonly known as: PERCOCET Take 1 tablet by mouth every 6 (six) hours as needed for up to 15 days for pain.        Consults:  None  Significant Diagnostic Studies:  No results found.  History of present illness:  Timothy Marks, a 28 year old male with a medical history significant for sickle cell disease,  type SS, chronic pain syndrome, opiate dependence, history of adjustment disorder, and history of anemia of chronic disease presents complaining of low back and lower extremity pain that is consistent with typical sickle cell pain crisis.  Patient states that pain intensity increased 2 days ago and he attributes pain crisis to changes in weather.  He states that he has been taking OxyContin and Percocet consistently without sustained relief.  He last had OxyContin around 6 this a.m.  Pain intensity is 8-9/10 characterized as constant and throbbing. Patient denies fever, chills, sore throat, persistent cough, recent travel, sick contacts, or exposure to COVID-19. He also denies dizziness, paresthesias, shortness of breath, dysuria, nausea, vomiting, or diarrhea.  [facility] Course: Patient admitted to sickle cell day infusion center for management of pain crisis. All laboratory values reviewed. Hemoglobin 10.7, consistent with patient's baseline.  No clinical indication for blood transfusion at this time. Patient afebrile and maintaining oxygen saturation above 90% on RA. Pain managed with IV Dilaudid via PCA with settings of 0.5 mg, 10-minute lockout, and 3 mg/h.  Patient used a total of 9.5 mg with 20 demands and 17 delivered. IV fluids, 0.45% saline at 100 mL/h. Toradol 15 mg IV x1 Tylenol 1000 mg by mouth x1 Pain intensity decreased to 5/10.  Patient states that he can manage at home on current medication regimen. Patient is aware of all upcoming appointments. Patient advised to resume all home  medications. Patient alert, oriented, and ambulating without assistance. Patient will discharge home in a hemodynamically stable condition.  Discharge instructions:  Resume all home medications.  Follow up with PCP as previously  scheduled.   Discussed the importance of drinking 64 ounces of water daily to  help prevent pain crises, it is important to drink plenty of water throughout  the day. This is because dehydration of red blood cells may lead further sickling.   Avoid all stressors that precipitate sickle cell pain crisis.     The patient was given clear instructions to go to ER or return to medical center if symptoms do not improve, worsen or new problems develop.    Physical Exam at Discharge:  BP (!) 105/54 (BP Location: Left Arm)   Pulse 63   Temp 97.8 F (36.6 C) (Oral)   Resp 16   SpO2 94%  Physical Exam Constitutional:      Appearance: Normal appearance.  HENT:     Head: Normocephalic.     Mouth/Throat:     Mouth: Mucous membranes are moist.     Pharynx: Oropharynx is clear.  Cardiovascular:     Rate and Rhythm: Normal rate and regular rhythm.  Pulmonary:     Effort: Pulmonary effort is normal.     Breath sounds: Normal breath sounds.  Abdominal:     General: Bowel sounds are normal.  Musculoskeletal: Normal range of motion.  Skin:    General: Skin is warm.  Neurological:     General: No focal deficit present.     Mental Status: He is alert. Mental status is at baseline.  Psychiatric:        Mood and Affect: Mood normal.        Behavior: Behavior normal.        Thought Content: Thought content normal.        Judgment: Judgment normal.       Disposition at Discharge: Discharge disposition: 01-Home or Self Care       Discharge Orders: Discharge Instructions    Discharge patient   Complete by: As directed    Discharge disposition: 01-Home or Self Care   Discharge patient date: 09/10/2018      Condition at Discharge:   Stable  Time spent on Discharge:  Greater than 30 minutes.  Signed: [name]  APRN, MSN, FNP-C [facility] [address] [place], [place] [address] [phone]  09/11/2018, 6:42 AM

## 2018-09-11 ENCOUNTER — Telehealth: Payer: Self-pay | Admitting: Family Medicine

## 2018-09-11 DIAGNOSIS — D571 Sickle-cell disease without crisis: Secondary | ICD-10-CM

## 2018-09-12 MED ORDER — OXYCODONE-ACETAMINOPHEN 10-325 MG PO TABS: 1.0000 | ORAL_TABLET | Freq: Four times a day (QID) | ORAL | 0 refills | Status: DC | PRN

## 2018-09-12 NOTE — Telephone Encounter (Signed)
Sent to NP 

## 2018-09-12 NOTE — Addendum Note (Signed)
Addended by: Genelle Bal on: 09/12/2018 04:16 PM   Modules accepted: Orders

## 2018-09-24 ENCOUNTER — Telehealth: Payer: Self-pay | Admitting: Family Medicine

## 2018-09-25 ENCOUNTER — Other Ambulatory Visit: Payer: Self-pay | Admitting: Family Medicine

## 2018-09-25 DIAGNOSIS — D571 Sickle-cell disease without crisis: Secondary | ICD-10-CM

## 2018-09-25 MED ORDER — OXYCODONE HCL ER 20 MG PO T12A: 20.0000 mg | EXTENDED_RELEASE_TABLET | Freq: Two times a day (BID) | ORAL | 0 refills | Status: DC

## 2018-09-25 MED ORDER — OXYCODONE-ACETAMINOPHEN 10-325 MG PO TABS: 1.0000 | ORAL_TABLET | Freq: Four times a day (QID) | ORAL | 0 refills | Status: DC | PRN

## 2018-09-25 NOTE — Progress Notes (Signed)
Refilled

## 2018-09-26 NOTE — Telephone Encounter (Signed)
Sent to np

## 2018-09-27 ENCOUNTER — Telehealth: Payer: Self-pay | Admitting: Family Medicine

## 2018-09-27 NOTE — Telephone Encounter (Signed)
Refill request for OxyContin and oxycodone. Please advise.

## 2018-09-27 NOTE — Telephone Encounter (Signed)
Sent to the pharmacy on 09/25/2018 for fill on 09/28/2018

## 2018-09-27 NOTE — Telephone Encounter (Signed)
Patient made aware. Thanks!  

## 2018-09-28 ENCOUNTER — Telehealth: Payer: Self-pay

## 2018-09-28 ENCOUNTER — Other Ambulatory Visit: Payer: Self-pay | Admitting: Family Medicine

## 2018-09-28 DIAGNOSIS — D571 Sickle-cell disease without crisis: Secondary | ICD-10-CM

## 2018-09-28 MED ORDER — OXYCODONE-ACETAMINOPHEN 10-325 MG PO TABS: 1.0000 | ORAL_TABLET | Freq: Four times a day (QID) | ORAL | 0 refills | Status: DC | PRN

## 2018-09-28 NOTE — Telephone Encounter (Signed)
Called and spoke with Aldona Bar at [facility] and she will delete this rx. Thanks !

## 2018-09-28 NOTE — Progress Notes (Signed)
Sent to pharmacy 

## 2018-09-28 NOTE — Telephone Encounter (Signed)
Please call and cancel the last prescription for this medication.

## 2018-10-09 ENCOUNTER — Telehealth: Payer: Self-pay

## 2018-10-11 ENCOUNTER — Encounter (HOSPITAL_COMMUNITY): Payer: Self-pay

## 2018-10-11 ENCOUNTER — Emergency Department (HOSPITAL_COMMUNITY): Payer: Medicaid Other

## 2018-10-11 ENCOUNTER — Emergency Department (HOSPITAL_COMMUNITY)
Admission: EM | Admit: 2018-10-11 | Discharge: 2018-10-11 | Disposition: A | Discharge status: Home / Self Care | Payer: Medicaid Other | Attending: Emergency Medicine | Admitting: Emergency Medicine

## 2018-10-11 ENCOUNTER — Other Ambulatory Visit: Payer: Self-pay

## 2018-10-11 ENCOUNTER — Other Ambulatory Visit: Payer: Self-pay | Admitting: Family Medicine

## 2018-10-11 DIAGNOSIS — Z79899 Other long term (current) drug therapy: Secondary | ICD-10-CM | POA: Insufficient documentation

## 2018-10-11 DIAGNOSIS — D5700 Hb-SS disease with crisis, unspecified: Secondary | ICD-10-CM | POA: Insufficient documentation

## 2018-10-11 DIAGNOSIS — D571 Sickle-cell disease without crisis: Secondary | ICD-10-CM

## 2018-10-11 DIAGNOSIS — M549 Dorsalgia, unspecified: Secondary | ICD-10-CM | POA: Diagnosis present

## 2018-10-11 LAB — CBC WITH DIFFERENTIAL/PLATELET
Abs Immature Granulocytes: 0.08 10*3/uL — ABNORMAL HIGH (ref 0.00–0.07)
Basophils Absolute: 0 10*3/uL (ref 0.0–0.1)
Basophils Relative: 0 %
Eosinophils Absolute: 0.2 10*3/uL (ref 0.0–0.5)
Eosinophils Relative: 1 %
HCT: 24.4 % — ABNORMAL LOW (ref 39.0–52.0)
Hemoglobin: 8.3 g/dL — ABNORMAL LOW (ref 13.0–17.0)
Immature Granulocytes: 1 %
Lymphocytes Relative: 33 %
Lymphs Abs: 3.9 10*3/uL (ref 0.7–4.0)
MCH: 31 pg (ref 26.0–34.0)
MCHC: 34 g/dL (ref 30.0–36.0)
MCV: 91 fL (ref 80.0–100.0)
Monocytes Absolute: 0.9 10*3/uL (ref 0.1–1.0)
Monocytes Relative: 8 %
Neutro Abs: 6.9 10*3/uL (ref 1.7–7.7)
Neutrophils Relative %: 57 %
Platelets: 327 10*3/uL (ref 150–400)
RBC: 2.68 MIL/uL — ABNORMAL LOW (ref 4.22–5.81)
RDW: 19.2 % — ABNORMAL HIGH (ref 11.5–15.5)
WBC: 12.1 10*3/uL — ABNORMAL HIGH (ref 4.0–10.5)
nRBC: 2.8 % — ABNORMAL HIGH (ref 0.0–0.2)

## 2018-10-11 LAB — COMPREHENSIVE METABOLIC PANEL
ALT: 10 U/L (ref 0–44)
AST: 33 U/L (ref 15–41)
Albumin: 4.5 g/dL (ref 3.5–5.0)
Alkaline Phosphatase: 63 U/L (ref 38–126)
Anion gap: 10 (ref 5–15)
BUN: 8 mg/dL (ref 6–20)
CO2: 23 mmol/L (ref 22–32)
Calcium: 9.4 mg/dL (ref 8.9–10.3)
Chloride: 106 mmol/L (ref 98–111)
Creatinine, Ser: 0.31 mg/dL — ABNORMAL LOW (ref 0.61–1.24)
GFR calc Af Amer: >60 mL/min (ref >60)
GFR calc non Af Amer: >60 mL/min (ref >60)
Glucose, Bld: 97 mg/dL (ref 70–99)
Potassium: 4.2 mmol/L (ref 3.5–5.1)
Sodium: 139 mmol/L (ref 135–145)
Total Bilirubin: 2.8 mg/dL — ABNORMAL HIGH (ref 0.3–1.2)
Total Protein: 7.6 g/dL (ref 6.5–8.1)

## 2018-10-11 LAB — RETICULOCYTES
Immature Retic Fract: 43.9 % — ABNORMAL HIGH (ref 2.3–15.9)
RBC.: 2.68 MIL/uL — ABNORMAL LOW (ref 4.22–5.81)
Retic Count, Absolute: 260 10*3/uL — ABNORMAL HIGH (ref 19.0–186.0)
Retic Ct Pct: 9.7 % — ABNORMAL HIGH (ref 0.4–3.1)

## 2018-10-11 MED ORDER — MELATONIN 3 MG PO TABS
6.00 | ORAL_TABLET | ORAL | Status: DC
Start: ? — End: 2018-10-11

## 2018-10-11 MED ORDER — ALUMINUM-MAGNESIUM-SIMETHICONE 200-200-20 MG/5ML PO SUSP
30.00 | ORAL | Status: DC
Start: ? — End: 2018-10-11

## 2018-10-11 MED ORDER — ONDANSETRON HCL 4 MG/2ML IJ SOLN
4.0000 mg | INTRAMUSCULAR | Status: DC | PRN
  Administered 2018-10-11: 4 mg via INTRAVENOUS
  Filled 2018-10-11: qty 2

## 2018-10-11 MED ORDER — HYDROMORPHONE HCL 2 MG/ML IJ SOLN: 2.0000 mg | INTRAMUSCULAR | Status: DC

## 2018-10-11 MED ORDER — SODIUM CHLORIDE 0.9% FLUSH
3.0000 mL | Freq: Once | INTRAVENOUS | Status: AC
  Administered 2018-10-11: 19:00:00 3 mL via INTRAVENOUS

## 2018-10-11 MED ORDER — HYDROMORPHONE HCL 2 MG/ML IJ SOLN: 2.0000 mg | INTRAMUSCULAR | Status: AC

## 2018-10-11 MED ORDER — DOCUSATE SODIUM 100 MG PO CAPS
100.00 | ORAL_CAPSULE | ORAL | Status: DC
Start: 2018-10-09 — End: 2018-10-11

## 2018-10-11 MED ORDER — ONDANSETRON HCL 4 MG/2ML IJ SOLN
4.00 | INTRAMUSCULAR | Status: DC
Start: ? — End: 2018-10-11

## 2018-10-11 MED ORDER — SODIUM CHLORIDE 0.9 % IV BOLUS
1000.0000 mL | Freq: Once | INTRAVENOUS | Status: AC
  Administered 2018-10-11: 21:00:00 1000 mL via INTRAVENOUS

## 2018-10-11 MED ORDER — DIPHENHYDRAMINE HCL 50 MG/ML IJ SOLN
25.0000 mg | Freq: Once | INTRAMUSCULAR | Status: AC
  Administered 2018-10-11: 19:00:00 25 mg via INTRAVENOUS
  Filled 2018-10-11: qty 1

## 2018-10-11 MED ORDER — FOLIC ACID 1 MG PO TABS
1.00 | ORAL_TABLET | ORAL | Status: DC
Start: 2018-10-10 — End: 2018-10-11

## 2018-10-11 MED ORDER — OXYCODONE-ACETAMINOPHEN 10-325 MG PO TABS
1.00 | ORAL_TABLET | ORAL | Status: DC
Start: ? — End: 2018-10-11

## 2018-10-11 MED ORDER — ACETAMINOPHEN 650 MG RE SUPP
650.00 | RECTAL | Status: DC
Start: ? — End: 2018-10-11

## 2018-10-11 MED ORDER — ACETAMINOPHEN 325 MG PO TABS
650.00 | ORAL_TABLET | ORAL | Status: DC
Start: ? — End: 2018-10-11

## 2018-10-11 MED ORDER — GABAPENTIN 300 MG PO CAPS
300.00 | ORAL_CAPSULE | ORAL | Status: DC
Start: 2018-10-09 — End: 2018-10-11

## 2018-10-11 MED ORDER — OXYCODONE-ACETAMINOPHEN 10-325 MG PO TABS: 1.0000 | ORAL_TABLET | Freq: Four times a day (QID) | ORAL | 0 refills | Status: DC | PRN

## 2018-10-11 MED ORDER — SODIUM CHLORIDE 0.9 % IV SOLN
INTRAVENOUS | Status: DC
Start: ? — End: 2018-10-11

## 2018-10-11 MED ORDER — ACETAMINOPHEN 650 MG/20.3ML PO SOLN
650.00 | ORAL | Status: DC
Start: ? — End: 2018-10-11

## 2018-10-11 MED ORDER — HYDROMORPHONE HCL 2 MG PO TABS
1.00 | ORAL_TABLET | ORAL | Status: DC
Start: ? — End: 2018-10-11

## 2018-10-11 MED ORDER — GUAIFENESIN-DM 100-10 MG/5ML PO SYRP
10.00 | ORAL_SOLUTION | ORAL | Status: DC
Start: ? — End: 2018-10-11

## 2018-10-11 MED ORDER — BISACODYL 5 MG PO TBEC
10.00 | DELAYED_RELEASE_TABLET | ORAL | Status: DC
Start: ? — End: 2018-10-11

## 2018-10-11 MED ORDER — HYDROMORPHONE HCL 2 MG/ML IJ SOLN
2.0000 mg | INTRAMUSCULAR | Status: AC
  Administered 2018-10-11: 19:00:00 2 mg via INTRAVENOUS
  Filled 2018-10-11: qty 1

## 2018-10-11 MED ORDER — HYDROXYUREA 500 MG PO CAPS
1000.00 | ORAL_CAPSULE | ORAL | Status: DC
Start: 2018-10-09 — End: 2018-10-11

## 2018-10-11 MED ORDER — CHOLECALCIFEROL 25 MCG (1000 UT) PO TABS
2000.00 | ORAL_TABLET | ORAL | Status: DC
Start: 2018-10-10 — End: 2018-10-11

## 2018-10-11 MED ORDER — MAGNESIUM OXIDE 400 MG PO TABS
400.00 | ORAL_TABLET | ORAL | Status: DC
Start: 2018-10-09 — End: 2018-10-11

## 2018-10-11 MED ORDER — APIXABAN 5 MG PO TABS
5.00 | ORAL_TABLET | ORAL | Status: DC
Start: 2018-10-09 — End: 2018-10-11

## 2018-10-11 MED ORDER — BISACODYL 10 MG RE SUPP
10.00 | RECTAL | Status: DC
Start: ? — End: 2018-10-11

## 2018-10-11 MED ORDER — HYDROMORPHONE HCL 2 MG/ML IJ SOLN
2.0000 mg | INTRAMUSCULAR | Status: DC
  Administered 2018-10-11: 21:00:00 2 mg via INTRAVENOUS
  Filled 2018-10-11: qty 1

## 2018-10-11 MED ORDER — HYDROMORPHONE HCL 2 MG/ML IJ SOLN
2.0000 mg | INTRAMUSCULAR | Status: AC
  Administered 2018-10-11: 20:00:00 2 mg via INTRAVENOUS
  Filled 2018-10-11: qty 1

## 2018-10-11 NOTE — ED Notes (Signed)
Unsuccessful IV attempt.

## 2018-10-11 NOTE — ED Provider Notes (Signed)
[facility] DEPT Provider Note   CSN: [id number] Arrival date & time: 10/11/18  1416     History   Chief Complaint Chief Complaint  Patient presents with  . Sickle Cell Pain Crisis    HPI Timothy Marks is a 29 y.o. male history of sickle cell, here presenting with upper back pain, typical crisis.  Patient states that since last night, he has been having upper and lower back pain.  He states that he is already on oxycodone and gabapentin at home.  He denies any chest pain or shortness of breath or fever or cough.  Patient was recently admitted for sickle cell pain crisis.      The history is provided by the patient.    Past Medical History:  Diagnosis Date  . Depression   . 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 Sickle cell anemia (H[facility])   . Vitamin D deficiency y-6    Patient Active Problem List   Diagnosis Date Noted  . Sickle cell anemia with crisis ([place]) 05/22/2018  . Acute bilateral low back pain without sciatica   . Other chronic pain   . Anemia of chronic disease   . 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 Physical deconditioning   . Agitation   . Chronic pain syndrome   . Recurrent cold sores   . Fever   . Pain   . Sickle cell crisis ([place]) 07/16/2017  . Adjustment disorder with mixed disturbance of emotions and conduct   . Constipation 08/03/2014  . 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 Protein-calorie malnutrition, severe ([place]) 04/09/2014  . Major depressive disorder, recurrent, severe without psychotic features ([place])   . 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 Vitamin D deficiency   . Sickle cell anemia ([place]) 03/30/2014  . Hyperbilirubinemia 03/30/2014  . Hypokalemia 02/07/2014  . Aggressive behavior 01/25/2013  . Sickle cell anemia with pain ([place]) 01/24/2013  . Sickle cell pain crisis ([place]) 01/24/2013    Past Surgical History:  Procedure Laterality  Date  . CHOLECYSTECTOMY          Home Medications    Prior to Admission medications   Medication Sig Start Date End Date Taking? Authorizing Provider  cholecalciferol (VITAMIN D) 1000 units tablet Take 2 tablets (2,000 Units total) by mouth daily. 12/13/17  Yes Azzie Glatter, FNP  folic acid (FOLVITE) 1 MG tablet Take 1 tablet (1 mg total) by mouth daily. 05/01/17  Yes Dorena Dew, FNP  gabapentin (NEURONTIN) 300 MG capsule TAKE 1 CAPSULE(300 MG) BY MOUTH THREE TIMES DAILY Patient taking differently: Take 300 mg by mouth 3 (three) times daily.  05/08/18  Yes Lanae Boast, FNP  hydroxyurea (HYDREA) 500 MG capsule Take 1 capsule (500 mg total) by mouth 2 (two) times daily. May take with food to minimize GI side effects. 05/01/17  Yes Dorena Dew, FNP  ibuprofen (ADVIL,MOTRIN) 800 MG tablet Take 1 tablet (800 mg total) by mouth 3 (three) times daily as needed (pain). 12/20/17  Yes Azzie Glatter, FNP  oxyCODONE (OXYCONTIN) 20 mg 12 hr tablet Take 1 tablet (20 mg total) by mouth every 12 (twelve) hours. 09/28/18 10/30/18 Yes Lanae Boast, FNP  aspirin EC 81 MG tablet Take 1 tablet (81 mg total) by mouth daily. Patient not taking: Reported on 10/11/2018 06/02/17   Dorena Dew, FNP  oxyCODONE-acetaminophen (PERCOCET) 10-325 MG tablet Take 1 tablet by mouth every  6 (six) hours as needed for up to 15 days for pain. 10/13/18 10/28/18  Lanae Boast, FNP    Family History Family History  Problem Relation Age of Onset  . Sickle cell trait Mother   . Sickle cell trait Father     Social History Social History   Tobacco Use  . Smoking status: Never Smoker  . Smokeless tobacco: Never Used  Substance Use Topics  . Alcohol use: No  . Drug use: Not Currently    Types: Marijuana     Allergies   Buprenorphine hcl, Levaquin [levofloxacin], Meperidine, Morphine and related, Toradol [ketorolac tromethamine], Tramadol, Vancomycin, and Zosyn [piperacillin sod-tazobactam so]    Review of Systems Review of Systems  Musculoskeletal: Positive for back pain.  All other systems reviewed and are negative.    Physical Exam Updated Vital Signs BP 115/74   Pulse 68   Temp 98 F (36.7 C) (Oral)   Resp 17   Ht 6\' 2"  (1.88 m)   Wt 61.2 kg   SpO2 98%   BMI 17.33 kg/m   Physical Exam Vitals signs and nursing note reviewed.  Constitutional:      Comments: Uncomfortable   HENT:     Head: Normocephalic.     Nose: Nose normal.     Mouth/Throat:     Mouth: Mucous membranes are moist.  Eyes:     Extraocular Movements: Extraocular movements intact.     Pupils: Pupils are equal, round, and reactive to light.  Neck:     Musculoskeletal: Normal range of motion.  Cardiovascular:     Rate and Rhythm: Normal rate and regular rhythm.     Pulses: Normal pulses.     Heart sounds: Normal heart sounds.  Pulmonary:     Effort: Pulmonary effort is normal.     Breath sounds: Normal breath sounds.  Abdominal:     General: Abdomen is flat.     Palpations: Abdomen is soft.  Musculoskeletal: Normal range of motion.     Comments: No midline spinal tenderness   Skin:    General: Skin is warm.     Capillary Refill: Capillary refill takes less than 2 seconds.  Neurological:     General: No focal deficit present.     Mental Status: He is oriented to person, place, and time.  Psychiatric:        Mood and Affect: Mood normal.        Behavior: Behavior normal.      ED Treatments / Results  Labs (all labs ordered are listed, but only abnormal results are displayed) Labs Reviewed  COMPREHENSIVE METABOLIC PANEL - Abnormal; Notable for the following components:      Result Value   Creatinine, Ser 0.31 (*)    Total Bilirubin 2.8 (*)    All other components within normal limits  CBC WITH DIFFERENTIAL/PLATELET - Abnormal; Notable for the following components:   WBC 12.1 (*)    RBC 2.68 (*)    Hemoglobin 8.3 (*)    HCT 24.4 (*)    RDW 19.2 (*)    nRBC 2.8 (*)    Abs  Immature Granulocytes 0.08 (*)    All other components within normal limits  RETICULOCYTES - Abnormal; Notable for the following components:   Retic Ct Pct 9.7 (*)    RBC. 2.68 (*)    Retic Count, Absolute 260.0 (*)    Immature Retic Fract 43.9 (*)    All other components within normal limits  EKG None  Radiology Dg Chest Port 1 View  Result Date: 10/11/2018 CLINICAL DATA:  Sickle cell crisis EXAM: PORTABLE CHEST 1 VIEW COMPARISON:  05/22/2018 FINDINGS: Heart is borderline in size. Mild vascular congestion. No confluent opacities, effusions or edema. No acute bony abnormality. IMPRESSION: Borderline heart size, mild vascular congestion. Electronically Signed   By: Rolm Baptise M.D.   On: 10/11/2018 21:00    Procedures Procedures (including critical care time)  Medications Ordered in ED Medications  HYDROmorphone (DILAUDID) injection 2 mg (has no administration in time range)    Or  HYDROmorphone (DILAUDID) injection 2 mg (has no administration in time range)  HYDROmorphone (DILAUDID) injection 2 mg (2 mg Intravenous Given 10/11/18 2054)    Or  HYDROmorphone (DILAUDID) injection 2 mg ( Subcutaneous See Alternative 10/11/18 2054)  ondansetron (ZOFRAN) injection 4 mg (4 mg Intravenous Given 10/11/18 1917)  sodium chloride flush (NS) 0.9 % injection 3 mL (3 mLs Intravenous Given 10/11/18 1923)  HYDROmorphone (DILAUDID) injection 2 mg (2 mg Intravenous Given 10/11/18 1918)    Or  HYDROmorphone (DILAUDID) injection 2 mg ( Subcutaneous See Alternative 10/11/18 1918)  HYDROmorphone (DILAUDID) injection 2 mg (2 mg Intravenous Given 10/11/18 2017)    Or  HYDROmorphone (DILAUDID) injection 2 mg ( Subcutaneous See Alternative 10/11/18 2017)  diphenhydrAMINE (BENADRYL) injection 25 mg (25 mg Intravenous Given 10/11/18 1917)  sodium chloride 0.9 % bolus 1,000 mL (1,000 mLs Intravenous New Bag/Given 10/11/18 2038)     Initial Impression / Assessment and Plan / ED Course  I have reviewed the  triage vital signs and the nursing notes.  Pertinent labs & imaging results that were available during my care of the patient were reviewed by me and considered in my medical decision making (see chart for details).       Timothy Marks is a 28 y.o. male here presenting with back pain.  He states that this is typical of his pain crises and had recent admission for sickle pain crisis. Will get labs, reticulocyte count. Will give pain meds per sickle cell protocol. I doubt acute chest.   9:19 PM Hg 8.3, slightly lower. Reticulocyte count appropriately elevated. Pain improved with pain meds. He states that he is well enough to go home. Will dc home. He follows up with sickle cell clinic    Final Clinical Impressions(s) / ED Diagnoses   Final diagnoses:  None    ED Discharge Orders    None       Drenda Freeze, MD 10/11/18 2120

## 2018-10-11 NOTE — ED Notes (Signed)
Zach RN attempting IV at this time.

## 2018-10-11 NOTE — ED Triage Notes (Signed)
Patient c/o sickle cell pain"all over " that started last night. Patient denies chest pain and SOB.

## 2018-10-11 NOTE — Discharge Instructions (Signed)
Take your pain medicines as prescribed   See your sickle cell doctor   Return to ER if you have worse back pain, chest pain, shortness of breath

## 2018-10-11 NOTE — ED Notes (Signed)
Call patient in the lobby to collect labs and no one responded

## 2018-10-11 NOTE — Progress Notes (Signed)
Refilled

## 2018-10-12 ENCOUNTER — Ambulatory Visit (INDEPENDENT_AMBULATORY_CARE_PROVIDER_SITE_OTHER): Payer: Medicaid Other | Admitting: Family Medicine

## 2018-10-12 ENCOUNTER — Non-Acute Institutional Stay (HOSPITAL_COMMUNITY)
Admission: AD | Admit: 2018-10-12 | Discharge: 2018-10-12 | Disposition: A | Discharge status: Home / Self Care | Payer: Medicaid Other | Source: Ambulatory Visit | Attending: Internal Medicine | Admitting: Internal Medicine

## 2018-10-12 ENCOUNTER — Ambulatory Visit: Payer: Self-pay | Admitting: Family Medicine

## 2018-10-12 ENCOUNTER — Encounter (HOSPITAL_COMMUNITY): Payer: Self-pay | Admitting: General Practice

## 2018-10-12 VITALS — BP 126/75 | HR 80 | Temp 98.8°F | Resp 16 | Ht 74.0 in | Wt 116.0 lb

## 2018-10-12 DIAGNOSIS — D638 Anemia in other chronic diseases classified elsewhere: Secondary | ICD-10-CM | POA: Diagnosis not present

## 2018-10-12 DIAGNOSIS — F1120 Opioid dependence, uncomplicated: Secondary | ICD-10-CM | POA: Diagnosis not present

## 2018-10-12 DIAGNOSIS — D5700 Hb-SS disease with crisis, unspecified: Secondary | ICD-10-CM | POA: Diagnosis present

## 2018-10-12 DIAGNOSIS — G894 Chronic pain syndrome: Secondary | ICD-10-CM | POA: Diagnosis not present

## 2018-10-12 DIAGNOSIS — F329 Major depressive disorder, single episode, unspecified: Secondary | ICD-10-CM | POA: Diagnosis not present

## 2018-10-12 DIAGNOSIS — D571 Sickle-cell disease without crisis: Secondary | ICD-10-CM

## 2018-10-12 DIAGNOSIS — Z79899 Other long term (current) drug therapy: Secondary | ICD-10-CM | POA: Diagnosis not present

## 2018-10-12 LAB — POCT URINALYSIS DIPSTICK
Glucose, UA: NEGATIVE
Ketones, UA: NEGATIVE
Leukocytes, UA: NEGATIVE
Nitrite, UA: NEGATIVE
Protein, UA: POSITIVE — AB
Spec Grav, UA: 1.015 (ref 1.010–1.025)
Urobilinogen, UA: 4 E.U./dL — AB
pH, UA: 6 (ref 5.0–8.0)

## 2018-10-12 LAB — CBC
HCT: 23.2 % — ABNORMAL LOW (ref 39.0–52.0)
Hemoglobin: 8.1 g/dL — ABNORMAL LOW (ref 13.0–17.0)
MCH: 31.6 pg (ref 26.0–34.0)
MCHC: 34.9 g/dL (ref 30.0–36.0)
MCV: 90.6 fL (ref 80.0–100.0)
Platelets: 356 10*3/uL (ref 150–400)
RBC: 2.56 MIL/uL — ABNORMAL LOW (ref 4.22–5.81)
RDW: 20.4 % — ABNORMAL HIGH (ref 11.5–15.5)
WBC: 14.1 10*3/uL — ABNORMAL HIGH (ref 4.0–10.5)
nRBC: 3.5 % — ABNORMAL HIGH (ref 0.0–0.2)

## 2018-10-12 MED ORDER — KETOROLAC TROMETHAMINE 30 MG/ML IJ SOLN
15.0000 mg | Freq: Once | INTRAMUSCULAR | Status: AC
  Administered 2018-10-12: 15 mg via INTRAVENOUS
  Filled 2018-10-12: qty 1

## 2018-10-12 MED ORDER — ONDANSETRON HCL 4 MG/2ML IJ SOLN: 4.0000 mg | Freq: Four times a day (QID) | INTRAMUSCULAR | Status: DC | PRN

## 2018-10-12 MED ORDER — DIPHENHYDRAMINE HCL 25 MG PO CAPS
25.0000 mg | ORAL_CAPSULE | Freq: Once | ORAL | Status: AC
  Administered 2018-10-12: 25 mg via ORAL
  Filled 2018-10-12: qty 1

## 2018-10-12 MED ORDER — DEXTROSE-NACL 5-0.45 % IV SOLN
INTRAVENOUS | Status: DC
  Administered 2018-10-12: 11:00:00 via INTRAVENOUS

## 2018-10-12 MED ORDER — ACETAMINOPHEN 500 MG PO TABS
1000.0000 mg | ORAL_TABLET | Freq: Once | ORAL | Status: AC
  Administered 2018-10-12: 1000 mg via ORAL
  Filled 2018-10-12: qty 2

## 2018-10-12 MED ORDER — NALOXONE HCL 0.4 MG/ML IJ SOLN: 0.4000 mg | INTRAMUSCULAR | Status: DC | PRN

## 2018-10-12 MED ORDER — SODIUM CHLORIDE 0.9 % IV SOLN
25.0000 mg | INTRAVENOUS | Status: DC | PRN
  Filled 2018-10-12: qty 0.5

## 2018-10-12 MED ORDER — DIPHENHYDRAMINE HCL 25 MG PO CAPS: 25.0000 mg | ORAL_CAPSULE | ORAL | Status: DC | PRN

## 2018-10-12 MED ORDER — HYDROMORPHONE HCL 1 MG/ML IJ SOLN
1.0000 mg | Freq: Once | INTRAMUSCULAR | Status: DC
  Filled 2018-10-12: qty 1

## 2018-10-12 MED ORDER — SODIUM CHLORIDE 0.9% FLUSH: 9.0000 mL | INTRAVENOUS | Status: DC | PRN

## 2018-10-12 MED ORDER — HYDROMORPHONE 1 MG/ML IV SOLN
INTRAVENOUS | Status: DC
  Administered 2018-10-12: 30 mg via INTRAVENOUS
  Administered 2018-10-12: 10.5 mg via INTRAVENOUS
  Filled 2018-10-12: qty 30

## 2018-10-12 NOTE — Discharge Instructions (Signed)
Sickle Cell Anemia, Adult ° °Sickle cell anemia is a condition where your red blood cells are shaped like sickles. Red blood cells carry oxygen through the body. Sickle-shaped cells do not live as long as normal red blood cells. They also clump together and block blood from flowing through the blood vessels. This prevents the body from getting enough oxygen. Sickle cell anemia causes organ damage and pain. It also increases the risk of infection. °Follow these instructions at home: °Medicines °· Take over-the-counter and prescription medicines only as told by your doctor. °· If you were prescribed an antibiotic medicine, take it as told by your doctor. Do not stop taking the antibiotic even if you start to feel better. °· If you develop a fever, do not take medicines to lower the fever right away. Tell your doctor about the fever. °Managing pain, stiffness, and swelling °· Try these methods to help with pain: °? Use a heating pad. °? Take a warm bath. °? Distract yourself, such as by watching TV. °Eating and drinking °· Drink enough fluid to keep your pee (urine) clear or pale yellow. Drink more in hot weather and during exercise. °· Limit or avoid alcohol. °· Eat a healthy diet. Eat plenty of fruits, vegetables, whole grains, and lean protein. °· Take vitamins and supplements as told by your doctor. °Traveling °· When traveling, keep these with you: °? Your medical information. °? The names of your doctors. °? Your medicines. °· If you need to take an airplane, talk to your doctor first. °Activity °· Rest often. °· Avoid exercises that make your heart beat much faster, such as jogging. °General instructions °· Do not use products that have nicotine or tobacco, such as cigarettes and e-cigarettes. If you need help quitting, ask your doctor. °· Consider wearing a medical alert bracelet. °· Avoid being in high places (high altitudes), such as mountains. °· Avoid very hot or cold temperatures. °· Avoid places where the  temperature changes a lot. °· Keep all follow-up visits as told by your doctor. This is important. °Contact a doctor if: °· A joint hurts. °· Your feet or hands hurt or swell. °· You feel tired (fatigued). °Get help right away if: °· You have symptoms of infection. These include: °? Fever. °? Chills. °? Being very tired. °? Irritability. °? Poor eating. °? Throwing up (vomiting). °· You feel dizzy or faint. °· You have new stomach pain, especially on the left side. °· You have a an erection (priapism) that lasts more than 4 hours. °· You have numbness in your arms or legs. °· You have a hard time moving your arms or legs. °· You have trouble talking. °· You have pain that does not go away when you take medicine. °· You are short of breath. °· You are breathing fast. °· You have a long-term cough. °· You have pain in your chest. °· You have a bad headache. °· You have a stiff neck. °· Your stomach looks bloated even though you did not eat much. °· Your skin is pale. °· You suddenly cannot see well. °Summary °· Sickle cell anemia is a condition where your red blood cells are shaped like sickles. °· Follow your doctor's advice on ways to manage pain, food to eat, activities to do, and steps to take for safe travel. °· Get medical help right away if you have any signs of infection, such as a fever. °This information is not intended to replace advice given to you by   your health care provider. Make sure you discuss any questions you have with your health care provider. °Document Released: 11/21/2012 Document Revised: 05/25/2018 Document Reviewed: 03/08/2016 °Elsevier Patient Education © 2020 [employer] ° °

## 2018-10-12 NOTE — Patient Instructions (Signed)
Sickle Cell Anemia, Adult ° °Sickle cell anemia is a condition where your red blood cells are shaped like sickles. Red blood cells carry oxygen through the body. Sickle-shaped cells do not live as long as normal red blood cells. They also clump together and block blood from flowing through the blood vessels. This prevents the body from getting enough oxygen. Sickle cell anemia causes organ damage and pain. It also increases the risk of infection. °Follow these instructions at home: °Medicines °· Take over-the-counter and prescription medicines only as told by your doctor. °· If you were prescribed an antibiotic medicine, take it as told by your doctor. Do not stop taking the antibiotic even if you start to feel better. °· If you develop a fever, do not take medicines to lower the fever right away. Tell your doctor about the fever. °Managing pain, stiffness, and swelling °· Try these methods to help with pain: °? Use a heating pad. °? Take a warm bath. °? Distract yourself, such as by watching TV. °Eating and drinking °· Drink enough fluid to keep your pee (urine) clear or pale yellow. Drink more in hot weather and during exercise. °· Limit or avoid alcohol. °· Eat a healthy diet. Eat plenty of fruits, vegetables, whole grains, and lean protein. °· Take vitamins and supplements as told by your doctor. °Traveling °· When traveling, keep these with you: °? Your medical information. °? The names of your doctors. °? Your medicines. °· If you need to take an airplane, talk to your doctor first. °Activity °· Rest often. °· Avoid exercises that make your heart beat much faster, such as jogging. °General instructions °· Do not use products that have nicotine or tobacco, such as cigarettes and e-cigarettes. If you need help quitting, ask your doctor. °· Consider wearing a medical alert bracelet. °· Avoid being in high places (high altitudes), such as mountains. °· Avoid very hot or cold temperatures. °· Avoid places where the  temperature changes a lot. °· Keep all follow-up visits as told by your doctor. This is important. °Contact a doctor if: °· A joint hurts. °· Your feet or hands hurt or swell. °· You feel tired (fatigued). °Get help right away if: °· You have symptoms of infection. These include: °? Fever. °? Chills. °? Being very tired. °? Irritability. °? Poor eating. °? Throwing up (vomiting). °· You feel dizzy or faint. °· You have new stomach pain, especially on the left side. °· You have a an erection (priapism) that lasts more than 4 hours. °· You have numbness in your arms or legs. °· You have a hard time moving your arms or legs. °· You have trouble talking. °· You have pain that does not go away when you take medicine. °· You are short of breath. °· You are breathing fast. °· You have a long-term cough. °· You have pain in your chest. °· You have a bad headache. °· You have a stiff neck. °· Your stomach looks bloated even though you did not eat much. °· Your skin is pale. °· You suddenly cannot see well. °Summary °· Sickle cell anemia is a condition where your red blood cells are shaped like sickles. °· Follow your doctor's advice on ways to manage pain, food to eat, activities to do, and steps to take for safe travel. °· Get medical help right away if you have any signs of infection, such as a fever. °This information is not intended to replace advice given to you by   your health care provider. Make sure you discuss any questions you have with your health care provider. °Document Released: 11/21/2012 Document Revised: 05/25/2018 Document Reviewed: 03/08/2016 °Elsevier Patient Education © 2020 Elsevier Inc. ° °

## 2018-10-12 NOTE — Progress Notes (Signed)
Patient admitted to the day hospital for treatment of sickle cell pain crisis. Patient reported pain rated 10/10 in the back . Patient placed on Dilaudid PCA, given PO Tylenol, IV Toradol, PO Benadryl and hydrated with IV fluids. At discharge patient reported  pain at 0/10. Discharge instructions given to patient. Patient alert, oriented and ambulatory at discharge.

## 2018-10-12 NOTE — H&P (Addendum)
[facility] History and Physical   Date: 10/12/2018  Patient name: Timothy Marks Medical record number: [id number] Date of birth: 02/02/1991 Age: 28 y.o. Gender: male PCP: Lanae Boast, [place]  Attending physician: Tresa Garter, MD  Chief Complaint: Sickle cell pain  History of Present Illness: Timothy Marks, 28 year old male with a medical history significant for sickle cell disease, chronic pain syndrome, oppositional defiance disorder, opiate dependence and tolerance, and anemia of chronic disease presented to sickle cell infusion center complaining of upper and lower back pain that is consistent with his typical sickle cell crisis.  Patient states that he has been having increased pain over the past 5 days.  He attributes pain crisis to being out of home pain medications.  He was treated and evaluated in the ER on 10/11/2018 without complete resolution of pain crisis.  Patient was also seen in primary care this a.m., agree with PCP that patient is appropriate to transition to day infusion center for pain management and extended observation.  Characterizes pain as 9/10, constant, and throbbing. Patient denies fever, chills, sore throat, persistent cough, chest pain, shortness of breath, dysuria, nausea, vomiting, or diarrhea.  Patient also denies sick contacts, recent travel, or exposure COVID-19.  Meds: Medications Prior to Admission  Medication Sig Dispense Refill Last Dose  . cholecalciferol (VITAMIN D) 1000 units tablet Take 2 tablets (2,000 Units total) by mouth daily. 30 tablet 6 10/12/2018 at Unknown time  . folic acid (FOLVITE) 1 MG tablet Take 1 tablet (1 mg total) by mouth daily. 90 tablet 2 10/12/2018 at Unknown time  . gabapentin (NEURONTIN) 300 MG capsule TAKE 1 CAPSULE(300 MG) BY MOUTH THREE TIMES DAILY (Patient taking differently: Take 300 mg by mouth 3 (three) times daily. ) 90 capsule 0 10/12/2018 at Unknown time  . hydroxyurea (HYDREA) 500 MG capsule  Take 1 capsule (500 mg total) by mouth 2 (two) times daily. May take with food to minimize GI side effects. 180 capsule 2 10/12/2018 at Unknown time  . ibuprofen (ADVIL,MOTRIN) 800 MG tablet Take 1 tablet (800 mg total) by mouth 3 (three) times daily as needed (pain). 30 tablet 0 10/12/2018 at Unknown time  . oxyCODONE (OXYCONTIN) 20 mg 12 hr tablet Take 1 tablet (20 mg total) by mouth every 12 (twelve) hours. 64 tablet 0 10/12/2018 at Unknown time  . [START ON 10/13/2018] oxyCODONE-acetaminophen (PERCOCET) 10-325 MG tablet Take 1 tablet by mouth every 6 (six) hours as needed for up to 15 days for pain. 60 tablet 0 10/12/2018 at Unknown time  . aspirin EC 81 MG tablet Take 1 tablet (81 mg total) by mouth daily. (Patient not taking: Reported on 10/11/2018) 30 tablet 11     Allergies: Buprenorphine hcl, Levaquin [levofloxacin], Meperidine, Morphine and related, Toradol [ketorolac tromethamine], Tramadol, Vancomycin, and Zosyn [piperacillin sod-tazobactam so] Past Medical History:  Diagnosis Date  . Depression   . 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 Sickle cell anemia (HCC)   . Vitamin D deficiency y-6   Past Surgical History:  Procedure Laterality Date  . CHOLECYSTECTOMY     Family History  Problem Relation Age of Onset  . Sickle cell trait Mother   . Sickle cell trait Father    Social History   Socioeconomic History  . Marital status: Single    Spouse name: Not on file  . Number of children: Not on file  . Years of education: Not on file  .  Highest education level: Not on file  Occupational History  . Not on file  Social Needs  . Financial resource strain: Not on file  . Food insecurity    Worry: Not on file    Inability: Not on file  . Transportation needs    Medical: Not on file    Non-medical: Not on file  Tobacco Use  . Smoking status: Never Smoker  . Smokeless tobacco: Never Used  Substance and Sexual Activity   . Alcohol use: No  . Drug use: Not Currently    Types: Marijuana  . Sexual activity: Not on file  Lifestyle  . Physical activity    Days per week: Not on file    Minutes per session: Not on file  . Stress: Not on file  Relationships  . Social Herbalist on phone: Not on file    Gets together: Not on file    Attends religious service: Not on file    Active member of club or organization: Not on file    Attends meetings of clubs or organizations: Not on file    Relationship status: Not on file  . Intimate partner violence    Fear of current or ex partner: Not on file    Emotionally abused: Not on file    Physically abused: Not on file    Forced sexual activity: Not on file  Other Topics Concern  . Not on file  Social History Narrative  . Not on file   Review of Systems  Constitutional: Negative for chills and fever.  HENT: Negative.   Eyes: Negative.   Respiratory: Negative.   Cardiovascular: Negative.   Gastrointestinal: Negative.   Genitourinary: Negative.   Musculoskeletal: Positive for back pain.  Skin: Negative.   Neurological: Negative.   Endo/Heme/Allergies: Negative.   Psychiatric/Behavioral: Negative.  Negative for substance abuse () and suicidal ideas.    Physical Exam: Blood pressure (!) 97/49, pulse 76, temperature 99.4 F (37.4 C), temperature source Oral, resp. rate 16, weight 116 lb (52.6 kg), SpO2 100 %. BP (!) 97/49 (BP Location: Right Arm)   Pulse 76   Temp 99.4 F (37.4 C) (Oral)   Resp 16   Wt 116 lb (52.6 kg)   SpO2 100%   BMI 14.89 kg/m   General Appearance:    Alert, cooperative, no distress, appears stated age  Head:    Normocephalic, without obvious abnormality, atraumatic  Eyes:    PERRL, conjunctiva/corneas clear, EOM's intact, fundi    benign, both eyes       Ears:    Normal TM's and external ear canals, both ears  Nose:   Nares normal, septum midline, mucosa normal, no drainage   or sinus tenderness  Throat:   Lips,  mucosa, and tongue normal; teeth and gums normal  Neck:   Supple, symmetrical, trachea midline, no adenopathy;       thyroid:  No enlargement/tenderness/nodules; no carotid   bruit or JVD  Back:     Symmetric, no curvature, ROM normal, no CVA tenderness  Lungs:     Clear to auscultation bilaterally, respirations unlabored  Chest wall:    No tenderness or deformity  Heart:    Regular rate and rhythm, S1 and S2 normal, no murmur, rub   or gallop  Abdomen:     Soft, non-tender, bowel sounds active all four quadrants,    no masses, no organomegaly  Extremities:   Extremities normal, atraumatic, no cyanosis or edema  Pulses:   2+ and symmetric all extremities  Skin:   Skin color, texture, turgor normal, no rashes or lesions  Lymph nodes:   Cervical, supraclavicular, and axillary nodes normal  Neurologic:   CNII-XII intact. Normal strength, sensation and reflexes      throughout    Lab results: Results for orders placed or performed during the hospital encounter of 10/12/18 (from the past 24 hour(s))  CBC     Status: Abnormal   Collection Time: 10/12/18 10:40 AM  Result Value Ref Range   WBC 14.1 (H) 4.0 - 10.5 K/uL   RBC 2.56 (L) 4.22 - 5.81 MIL/uL   Hemoglobin 8.1 (L) 13.0 - 17.0 g/dL   HCT 23.2 (L) 39.0 - 52.0 %   MCV 90.6 80.0 - 100.0 fL   MCH 31.6 26.0 - 34.0 pg   MCHC 34.9 30.0 - 36.0 g/dL   RDW 20.4 (H) 11.5 - 15.5 %   Platelets 356 150 - 400 K/uL   nRBC 3.5 (H) 0.0 - 0.2 %    Imaging results:  Dg Chest Port 1 View  Result Date: 10/11/2018 CLINICAL DATA:  Sickle cell crisis EXAM: PORTABLE CHEST 1 VIEW COMPARISON:  05/22/2018 FINDINGS: Heart is borderline in size. Mild vascular congestion. No confluent opacities, effusions or edema. No acute bony abnormality. IMPRESSION: Borderline heart size, mild vascular congestion. Electronically Signed   By: Rolm Baptise M.D.   On: 10/11/2018 21:00     Assessment & Plan: Patient admitted to sickle cell day infusion center for  management of pain crisis.  Patient opiate tolerant  Initiate D5 .45% saline at 100 ml/hour for cellular rehydration  Toradol 15 mg IV times one for inflammation.  Tylenol 1000 mg times one  Start Dilaudid PCA High Concentration per weight based protocol.    Patient will be re-evaluated for pain intensity in the context of function and relationship to baseline as care progresses.  If no significant pain relief, will transfer patient to inpatient services for a higher level of care.    Reviewed labs from 10/11/2018, WBCs were 12.1, hemoglobin 8.3, repeat CBC today.  CMP unremarkable.    [name]  APRN, MSN, FNP-C Patient [facility] [address] [place], [place] [address] [phone]  10/12/2018, 2:07 PM   THIS NOTE WAS PREPARED USING DRAGON SOFTWARE.

## 2018-10-12 NOTE — Progress Notes (Signed)
PATIENT CARE CENTER INTERNAL MEDICINE AND SICKLE CELL CARE  SICKLE CELL ANEMIA FOLLOW UP VISIT PROVIDER: Lanae Boast, FNP    Subjective:   Timothy Marks  is a 28 y.o.  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 presents for a follow up for Sickle Cell Anemia. Patient treated in the ED 10/11/2018 due to sickle cell pain crisis. He was not admitted. Continues to have 7/10 pain today.  Would like to be admitted to the infusion center for pain management.   Review of Systems  Constitutional: Negative.   HENT: Negative.   Eyes: Negative.   Respiratory: Negative.   Cardiovascular: Negative.   Gastrointestinal: Negative.   Genitourinary: Negative.   Musculoskeletal: Positive for back pain and myalgias.  Skin: Negative.   Neurological: Negative.   Psychiatric/Behavioral: Negative.     Objective:   Objective  BP 126/75 (BP Location: Left Arm, Patient Position: Sitting, Cuff Size: Normal)   Pulse 80   Temp 98.8 F (37.1 C) (Oral)   Resp 16   Ht 6\' 2"  (1.88 m)   Wt 116 lb (52.6 kg)   SpO2 100%   BMI 14.89 kg/m   Wt Readings from Last 3 Encounters:  10/12/18 116 lb (52.6 kg)  10/11/18 135 lb (61.2 kg)  08/31/18 118 lb (53.5 kg)     Physical Exam Vitals signs and nursing note reviewed.  Constitutional:      General: He is not in acute distress.    Appearance: Normal appearance. He is ill-appearing.  HENT:     Head: Normocephalic and atraumatic.  Eyes:     Extraocular Movements: Extraocular movements intact.     Conjunctiva/sclera: Conjunctivae normal.     Pupils: Pupils are equal, round, and reactive to light.  Cardiovascular:     Rate and Rhythm: Normal rate and regular rhythm.     Heart sounds: No murmur.  Pulmonary:     Effort: Pulmonary effort is normal.     Breath sounds: Normal breath sounds.  Musculoskeletal: Normal  range of motion.  Skin:    General: Skin is warm and dry.  Neurological:     Mental Status: He is alert and oriented to person, place, and time.  Psychiatric:        Mood and Affect: Mood normal.        Behavior: Behavior normal.        Thought Content: Thought content normal.        Judgment: Judgment normal.      Assessment/Plan:   Assessment   Encounter Diagnosis  Name Primary?  Marland Kitchen Hb-SS disease without crisis ([place]) Yes     Plan  1. Hb-SS disease without crisis ([place]) Sent to the infusion center for further evaluation.  - Urinalysis Dipstick    Return to care as scheduled and prn. Patient verbalized understanding and agreed with plan of care.   1. Sickle cell disease -  We discussed the need for good hydration, monitoring of hydration status, avoidance of heat, cold, stress, and infection triggers. We discussed the risks and benefits of Hydrea, including bone marrow suppression, the possibility of GI upset, skin ulcers, hair thinning, and teratogenicity. The patient was reminded of the need to seek medical attention of any symptoms of bleeding, anemia, or infection. Continue folic acid 1 mg daily to prevent aplastic bone marrow crises.   2. Pulmonary evaluation - Patient denies  severe recurrent wheezes, shortness of breath with exercise, or persistent cough. If these symptoms develop, pulmonary function tests with spirometry will be ordered, and if abnormal, plan on referral to Pulmonology for further evaluation.  3. Cardiac - Routine screening for pulmonary hypertension is not recommended.  4. Eye - High risk of proliferative retinopathy. Annual eye exam with retinal exam recommended to patient.  5. Immunization status -  Yearly influenza vaccination is recommended, as well as being up to date with Meningococcal and Pneumococcal vaccines.   6. Acute and chronic painful episodes - We discussed that pt is to receive Schedule II prescriptions only from [place]. Pt is also aware that  the prescription history is available to [place] online through the [facility] CSRS. Controlled substance agreement signed. We reminded Amod Liquori that all patients receiving Schedule II narcotics must be seen for follow within one month of prescription being requested. We reviewed the terms of our pain agreement, including the need to keep medicines in a safe locked location away from children or pets, and the need to report excess sedation or constipation, measures to avoid constipation, and policies related to early refills and stolen prescriptions. According to the [place] Chronic Pain Initiative program, we have reviewed details related to analgesia, adverse effects, aberrant behaviors.  7. Iron overload from chronic transfusion.  Not applicable at this time.  If this occurs will use Exjade for management.   8. Vitamin D deficiency - Drisdol 50,000 units weekly. Patient encouraged to take as prescribed.   The above recommendations are taken from the NIH Evidence-Based Management of Sickle Cell Disease: Expert Panel Report, 20149.   [name], FNP-BC Patient [facility] [address] [place], [place] [address] [phone]  This note has been created with Dragon speech recognition software and smart phrase technology. Any transcriptional errors are unintentional.

## 2018-10-12 NOTE — Progress Notes (Cosign Needed)
Dilaudid waste 19 mg. Unable to waste in pyxis. AC unavailable

## 2018-10-14 NOTE — Discharge Summary (Addendum)
[facility] Discharge Summary   Patient ID: Timothy Marks MRN: [id number] DOB/AGE: [birth year] 28 y.o.  Admit date: 10/12/2018 Discharge date: 10/14/2018  Primary Care Physician:  Lanae Boast, [place]  Admission Diagnoses:  Active Problems:   Sickle cell pain crisis [facility])   Discharge Medications:  Allergies as of 10/12/2018      Reactions   Buprenorphine Hcl Hives   Levaquin [levofloxacin] Itching   Meperidine Rash   Morphine And Related Hives   Toradol [ketorolac Tromethamine] Itching   Tramadol Hives   Vancomycin Itching   Zosyn [piperacillin Sod-tazobactam So] Itching, Rash   Has taken rocephin in past      Medication List    TAKE these medications   aspirin EC 81 MG tablet Take 1 tablet (81 mg total) by mouth daily.   cholecalciferol 25 MCG (1000 UT) tablet Commonly known as: VITAMIN D Take 2 tablets (2,000 Units total) by mouth daily.   folic acid 1 MG tablet Commonly known as: FOLVITE Take 1 tablet (1 mg total) by mouth daily.   gabapentin 300 MG capsule Commonly known as: NEURONTIN TAKE 1 CAPSULE(300 MG) BY MOUTH THREE TIMES DAILY What changed: See the new instructions.   hydroxyurea 500 MG capsule Commonly known as: HYDREA Take 1 capsule (500 mg total) by mouth 2 (two) times daily. May take with food to minimize GI side effects.   ibuprofen 800 MG tablet Commonly known as: ADVIL Take 1 tablet (800 mg total) by mouth 3 (three) times daily as needed (pain).   oxyCODONE 20 mg 12 hr tablet Commonly known as: OXYCONTIN Take 1 tablet (20 mg total) by mouth every 12 (twelve) hours.   oxyCODONE-acetaminophen 10-325 MG tablet Commonly known as: PERCOCET Take 1 tablet by mouth every 6 (six) hours as needed for up to 15 days for pain.        Consults:  None  Significant Diagnostic Studies:  Dg Chest Port 1 View  Result Date: 10/11/2018 CLINICAL DATA:  Sickle cell crisis EXAM: PORTABLE CHEST 1 VIEW COMPARISON:  05/22/2018 FINDINGS:  Heart is borderline in size. Mild vascular congestion. No confluent opacities, effusions or edema. No acute bony abnormality. IMPRESSION: Borderline heart size, mild vascular congestion. Electronically Signed   By: Rolm Baptise M.D.   On: 10/11/2018 21:00   History of present illness: Timothy Marks, a 28 year old male with a medical history significant for sickle cell disease, chronic pain syndrome, oppositional defiance disorder, opiate dependence and tolerance, and anemia of chronic disease presented to sickle cell day infusion center complaining of pain to upper and lower back that is consistent with his typical sickle cell crisis.  Patient states that he has been having increased pain over the past 5 days.  He attributes pain crisis to being out of home medications.  He was treated and evaluated in the ER on 10/11/2018 without complete resolution of pain crisis.  Patient was also seen in primary care this a.m., agreed with PCP that patient is appropriate to transition to day infusion center for pain management and extended observation.  Aristeo characterizes pain as 9/10, constant, and throbbing.  Patient denies fever, chills, sore throat, persistent cough, chest pain, shortness of breath, dysuria, nausea, vomiting, or diarrhea.  Patient also denies sick contacts, recent travel, or exposure to COVID-19. [facility] Course: Patient was admitted to sickle cell day infusion center for management of sickle cell pain crisis. All laboratory values reviewed. WBCs 14.1, no sign of infection or inflammation.  Improved from  previous.  Patient afebrile. Hemoglobin 8.1, consistent with patient's baseline.  No clinical indication for blood transfusion during current admission. Patient has poor venous access, IV team consulted for IV start. Pain managed with IV Dilaudid via PCA per weight-based protocol.  Settings of 0.5 mg, 10-minute lockout, and 3 mg/h. IV fluids, 0.45% saline at 100 mL/h. Toradol  15 mg IV x1 Tylenol 1000 mg by mouth x1. Pain crisis resolved, patient not complaining of any pain at this time.  His PCP has sent opiate medications to pharmacy.  Patient has agreed to pick them up. Patient alert, oriented, and ambulating without assistance. Patient discharged home in a hemodynamically stable condition.  Physical Exam at Discharge:  BP 118/70 (BP Location: Right Arm)   Pulse 85   Temp 99.4 F (37.4 C) (Oral)   Resp 18   Wt 116 lb (52.6 kg)   SpO2 97%   BMI 14.89 kg/m   Physical Exam Constitutional:      Appearance: Normal appearance.  HENT:     Head: Normocephalic.     Right Ear: Tympanic membrane normal.  Cardiovascular:     Rate and Rhythm: Normal rate and regular rhythm.  Pulmonary:     Effort: Pulmonary effort is normal.     Breath sounds: Normal breath sounds.  Abdominal:     General: Bowel sounds are normal.     Palpations: Abdomen is soft.  Skin:    General: Skin is warm.  Neurological:     General: No focal deficit present.     Mental Status: He is alert and oriented to person, place, and time. Mental status is at baseline.      Disposition at Discharge: Discharge disposition: 01-Home or Self Care      Discharge instructions:  Resume all home medications.  Follow up with PCP as previously  scheduled.   Discussed the importance of drinking 64 ounces of water daily to  help prevent pain crises, it is important to drink plenty of water throughout the day. This is because dehydration of red blood cells may lead further sickling.   Avoid all stressors that precipitate sickle cell pain crisis.     The patient was given clear instructions to go to ER or return to medical center if symptoms do not improve, worsen or new problems develop.    Discharge Orders: Discharge Instructions    Discharge patient   Complete by: As directed    Discharge disposition: 01-Home or Self Care   Discharge patient date: 10/12/2018    .dis  Condition at  Discharge:   Stable  Time spent on Discharge:  Greater than 30 minutes.  Signed: [name]  APRN, MSN, FNP-C Patient [facility] [address] [place], [place] [address] [phone]  10/14/2018, 3:21 PM   THIS NOTE WAS PREPARED USING DRAGON SOFTWARE.

## 2018-10-17 ENCOUNTER — Encounter: Payer: Self-pay | Admitting: Family Medicine

## 2018-10-23 ENCOUNTER — Telehealth: Payer: Self-pay

## 2018-10-23 DIAGNOSIS — D571 Sickle-cell disease without crisis: Secondary | ICD-10-CM

## 2018-10-24 ENCOUNTER — Encounter (HOSPITAL_COMMUNITY): Payer: Self-pay

## 2018-10-24 ENCOUNTER — Encounter (HOSPITAL_COMMUNITY): Payer: Self-pay | Admitting: *Deleted

## 2018-10-24 MED ORDER — OXYCODONE-ACETAMINOPHEN 10-325 MG PO TABS: 1.0000 | ORAL_TABLET | Freq: Four times a day (QID) | ORAL | 0 refills | Status: DC | PRN

## 2018-10-24 MED ORDER — OXYCODONE HCL ER 20 MG PO T12A: 20.0000 mg | EXTENDED_RELEASE_TABLET | Freq: Two times a day (BID) | ORAL | 0 refills | Status: DC

## 2018-10-24 NOTE — Telephone Encounter (Signed)
Refilled

## 2018-10-29 ENCOUNTER — Other Ambulatory Visit: Payer: Self-pay

## 2018-10-29 ENCOUNTER — Encounter (HOSPITAL_COMMUNITY): Payer: Self-pay | Admitting: Emergency Medicine

## 2018-10-29 ENCOUNTER — Emergency Department (HOSPITAL_COMMUNITY)
Admission: EM | Admit: 2018-10-29 | Discharge: 2018-10-29 | Disposition: A | Discharge status: Home / Self Care | Payer: Medicaid Other | Attending: Emergency Medicine | Admitting: Emergency Medicine

## 2018-10-29 DIAGNOSIS — Z79899 Other long term (current) drug therapy: Secondary | ICD-10-CM | POA: Insufficient documentation

## 2018-10-29 DIAGNOSIS — D5700 Hb-SS disease with crisis, unspecified: Secondary | ICD-10-CM | POA: Diagnosis not present

## 2018-10-29 DIAGNOSIS — K0889 Other specified disorders of teeth and supporting structures: Secondary | ICD-10-CM | POA: Insufficient documentation

## 2018-10-29 DIAGNOSIS — M545 Low back pain: Secondary | ICD-10-CM | POA: Diagnosis present

## 2018-10-29 LAB — BASIC METABOLIC PANEL
Anion gap: 11 (ref 5–15)
BUN: 9 mg/dL (ref 6–20)
CO2: 27 mmol/L (ref 22–32)
Calcium: 9.6 mg/dL (ref 8.9–10.3)
Chloride: 102 mmol/L (ref 98–111)
Creatinine, Ser: 0.54 mg/dL — ABNORMAL LOW (ref 0.61–1.24)
GFR calc Af Amer: >60 mL/min (ref >60)
GFR calc non Af Amer: >60 mL/min (ref >60)
Glucose, Bld: 91 mg/dL (ref 70–99)
Potassium: 4.5 mmol/L (ref 3.5–5.1)
Sodium: 140 mmol/L (ref 135–145)

## 2018-10-29 LAB — CBC WITH DIFFERENTIAL/PLATELET
Abs Immature Granulocytes: 0.09 10*3/uL — ABNORMAL HIGH (ref 0.00–0.07)
Basophils Absolute: 0 10*3/uL (ref 0.0–0.1)
Basophils Relative: 0 %
Eosinophils Absolute: 0.5 10*3/uL (ref 0.0–0.5)
Eosinophils Relative: 3 %
HCT: 29.2 % — ABNORMAL LOW (ref 39.0–52.0)
Hemoglobin: 10 g/dL — ABNORMAL LOW (ref 13.0–17.0)
Immature Granulocytes: 1 %
Lymphocytes Relative: 23 %
Lymphs Abs: 3.2 10*3/uL (ref 0.7–4.0)
MCH: 31.3 pg (ref 26.0–34.0)
MCHC: 34.2 g/dL (ref 30.0–36.0)
MCV: 91.5 fL (ref 80.0–100.0)
Monocytes Absolute: 1.1 10*3/uL — ABNORMAL HIGH (ref 0.1–1.0)
Monocytes Relative: 8 %
Neutro Abs: 9.1 10*3/uL — ABNORMAL HIGH (ref 1.7–7.7)
Neutrophils Relative %: 65 %
Platelets: 513 10*3/uL — ABNORMAL HIGH (ref 150–400)
RBC: 3.19 MIL/uL — ABNORMAL LOW (ref 4.22–5.81)
RDW: 17.3 % — ABNORMAL HIGH (ref 11.5–15.5)
WBC: 14 10*3/uL — ABNORMAL HIGH (ref 4.0–10.5)
nRBC: 0.6 % — ABNORMAL HIGH (ref 0.0–0.2)

## 2018-10-29 LAB — RETICULOCYTES
Immature Retic Fract: 30.4 % — ABNORMAL HIGH (ref 2.3–15.9)
RBC.: 3.19 MIL/uL — ABNORMAL LOW (ref 4.22–5.81)
Retic Count, Absolute: 305.3 10*3/uL — ABNORMAL HIGH (ref 19.0–186.0)
Retic Ct Pct: 9.6 % — ABNORMAL HIGH (ref 0.4–3.1)

## 2018-10-29 MED ORDER — DIPHENHYDRAMINE HCL 50 MG/ML IJ SOLN
25.0000 mg | Freq: Once | INTRAMUSCULAR | Status: AC
  Administered 2018-10-29: 25 mg via INTRAVENOUS
  Filled 2018-10-29: qty 1

## 2018-10-29 MED ORDER — HYDROMORPHONE HCL 2 MG/ML IJ SOLN
2.0000 mg | Freq: Once | INTRAMUSCULAR | Status: AC
  Administered 2018-10-29: 2 mg via INTRAVENOUS
  Filled 2018-10-29: qty 1

## 2018-10-29 MED ORDER — HYDROMORPHONE HCL 2 MG/ML IJ SOLN
2.0000 mg | Freq: Once | INTRAMUSCULAR | Status: AC
  Administered 2018-10-29: 04:00:00 2 mg via INTRAVENOUS
  Filled 2018-10-29: qty 1

## 2018-10-29 MED ORDER — CLINDAMYCIN HCL 300 MG PO CAPS: 300.0000 mg | ORAL_CAPSULE | Freq: Four times a day (QID) | ORAL | 0 refills | Status: DC

## 2018-10-29 MED ORDER — ONDANSETRON HCL 4 MG/2ML IJ SOLN
4.0000 mg | Freq: Once | INTRAMUSCULAR | Status: AC
  Administered 2018-10-29: 4 mg via INTRAVENOUS
  Filled 2018-10-29: qty 2

## 2018-10-29 MED ORDER — SODIUM CHLORIDE 0.9 % IV BOLUS
1000.0000 mL | Freq: Once | INTRAVENOUS | Status: AC
  Administered 2018-10-29: 1000 mL via INTRAVENOUS

## 2018-10-29 MED ORDER — OXYCODONE-ACETAMINOPHEN 5-325 MG PO TABS
2.0000 | ORAL_TABLET | Freq: Once | ORAL | Status: AC
  Administered 2018-10-29: 2 via ORAL
  Filled 2018-10-29: qty 2

## 2018-10-29 NOTE — ED Provider Notes (Signed)
[facility] DEPT Provider Note   CSN: [id number] Arrival date & time: 10/29/18  0340     History   Chief Complaint Chief Complaint  Patient presents with   Sickle Cell Pain Crisis    HPI Timothy Marks is a 28 y.o. male.     Patient is a 28 year old male with past medical history of sickle cell disease presenting with complaints of pain in his back and gums.  He states that this feels like a sickle cell crisis.  Patient denies any chest pain or difficulty breathing.  He denies any fevers or chills.  He has had no relief with his home medication.  The history is provided by the patient.  Sickle Cell Pain Crisis Pain location: Low back. 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   Sickle cell anemia (HCC)    Vitamin D deficiency y-6    Patient Active Problem List   Diagnosis Date Noted   Sickle cell anemia with crisis ([place]) 05/22/2018   Acute bilateral low back pain without sciatica    Other chronic pain    Anemia of chronic disease    Thrombocythemia (HCC)    Physical deconditioning    Agitation    Chronic pain syndrome    Recurrent cold sores    Fever    Pain    Sickle cell crisis ([place]) 07/16/2017   Adjustment disorder with mixed disturbance of emotions and conduct    Constipation 08/03/2014   h/o Priapism 05/06/2014   Protein-calorie malnutrition, severe ([place]) 04/09/2014   Major depressive disorder, recurrent, severe without psychotic features ([place])    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 Authorizing Provider  cholecalciferol (VITAMIN D) 1000 units tablet Take 2 tablets (2,000 Units total) by mouth daily. 12/13/17  Yes Azzie Glatter, FNP  folic acid (FOLVITE) 1 MG tablet Take 1 tablet (1 mg total) by mouth daily. 05/01/17  Yes Dorena Dew, FNP  gabapentin (NEURONTIN) 300 MG capsule TAKE 1 CAPSULE(300 MG) BY MOUTH THREE TIMES DAILY Patient taking differently: Take 300 mg by mouth 3 (three) times daily.  05/08/18  Yes Lanae Boast, FNP  hydroxyurea (HYDREA) 500 MG capsule Take 1 capsule (500 mg total) by mouth 2 (two) times daily. May take with food to minimize GI side effects. 05/01/17  Yes Dorena Dew, FNP  ibuprofen (ADVIL,MOTRIN) 800 MG tablet Take 1 tablet (800 mg total) by mouth 3 (three) times daily as needed (pain). 12/20/17  Yes Azzie Glatter, FNP  oxyCODONE (OXYCONTIN) 20 mg 12 hr tablet Take 1 tablet (20 mg total) by mouth every 12 (twelve) hours. 10/24/18 11/25/18 Yes Lanae Boast, [place]  oxyCODONE-acetaminophen (PERCOCET)  10-325 MG tablet Take 1 tablet by mouth every 6 (six) hours as needed for up to 15 days for pain. 10/24/18 11/08/18 Yes Lanae Boast, FNP  aspirin EC 81 MG tablet Take 1 tablet (81 mg total) by mouth daily. Patient not taking: Reported on 10/11/2018 06/02/17   Dorena Dew, FNP    Family History Family History  Problem Relation Age of Onset   Sickle cell trait Mother    Sickle cell trait Father     Social History Social History   Tobacco Use   Smoking status: Never Smoker   Smokeless tobacco: Never Used  Substance Use Topics   Alcohol use: No    Drug use: Not Currently    Types: Marijuana     Allergies   Buprenorphine hcl, Levaquin [levofloxacin], Meperidine, Morphine and related, Toradol [ketorolac tromethamine], Tramadol, Vancomycin, and Zosyn [piperacillin sod-tazobactam so]   Review of Systems Review of Systems  All other systems reviewed and are negative.    Physical Exam Updated Vital Signs BP 112/68    Pulse 79    Temp 98 F (36.7 C) (Oral)    Resp 16    Ht 6\' 2"  (1.88 m)    Wt 61.2 kg    SpO2 100%    BMI 17.33 kg/m   Physical Exam Vitals signs and nursing note reviewed.  Constitutional:      General: He is not in acute distress.    Appearance: He is well-developed. He is not diaphoretic.  HENT:     Head: Normocephalic and atraumatic.     Mouth/Throat:     Comments: There is some gingival inflammation adjacent to the bottom canine and bicuspids. Neck:     Musculoskeletal: Normal range of motion and neck supple.  Cardiovascular:     Rate and Rhythm: Normal rate and regular rhythm.     Heart sounds: No murmur. No friction rub.  Pulmonary:     Effort: Pulmonary effort is normal. No respiratory distress.     Breath sounds: Normal breath sounds. No wheezing or rales.  Abdominal:     General: Bowel sounds are normal. There is no distension.     Palpations: Abdomen is soft.     Tenderness: There is no abdominal tenderness.  Musculoskeletal: Normal range of motion.     Comments: There is tenderness to palpation in the soft tissues of the lower lumbar region.  Skin:    General: Skin is warm and dry.  Neurological:     Mental Status: He is alert and oriented to person, place, and time.     Coordination: Coordination normal.      ED Treatments / Results  Labs (all labs ordered are listed, but only abnormal results are displayed) Labs Reviewed  BASIC METABOLIC PANEL - Abnormal; Notable for the following components:      Result Value   Creatinine, Ser 0.54 (*)    All other components within normal limits   CBC WITH DIFFERENTIAL/PLATELET - Abnormal; Notable for the following components:   WBC 14.0 (*)    RBC 3.19 (*)    Hemoglobin 10.0 (*)    HCT 29.2 (*)    RDW 17.3 (*)    Platelets 513 (*)    nRBC 0.6 (*)    Neutro Abs 9.1 (*)    Monocytes Absolute 1.1 (*)    Abs Immature Granulocytes 0.09 (*)    All other components within normal limits  RETICULOCYTES - Abnormal; Notable for the following components:   Retic  Ct Pct 9.6 (*)    RBC. 3.19 (*)    Retic Count, Absolute 305.3 (*)    Immature Retic Fract 30.4 (*)    All other components within normal limits    EKG None  Radiology No results found.  Procedures Procedures (including critical care time)  Medications Ordered in ED Medications  HYDROmorphone (DILAUDID) injection 2 mg (2 mg Intravenous Given 10/29/18 0427)  ondansetron (ZOFRAN) injection 4 mg (4 mg Intravenous Given 10/29/18 0424)  sodium chloride 0.9 % bolus 1,000 mL (1,000 mLs Intravenous New Bag/Given 10/29/18 0450)  diphenhydrAMINE (BENADRYL) injection 25 mg (25 mg Intravenous Given 10/29/18 0422)  HYDROmorphone (DILAUDID) injection 2 mg (2 mg Intravenous Given 10/29/18 0516)  HYDROmorphone (DILAUDID) injection 2 mg (2 mg Intravenous Given 10/29/18 0557)     Initial Impression / Assessment and Plan / ED Course  I have reviewed the triage vital signs and the nursing notes.  Pertinent labs & imaging results that were available during my care of the patient were reviewed by me and considered in my medical decision making (see chart for details).  Patient's laboratory studies are reassuring.  Patient is afebrile and patient appears appropriate for discharge.  He will be given antibiotics for his tooth pain and is to follow-up in the sickle cell clinic as needed.  Final Clinical Impressions(s) / ED Diagnoses   Final diagnoses:  None    ED Discharge Orders    None       Veryl Speak, MD 10/29/18 [phone]

## 2018-10-29 NOTE — ED Triage Notes (Signed)
Pt reports sickle cell pain that is located in back and gums.

## 2018-10-29 NOTE — ED Notes (Signed)
Hot packs provided to the pt for gum pain.

## 2018-10-29 NOTE — ED Notes (Signed)
During discharge patient continuously smacking himself in the face with a heat pack. This nurse requesting patient to just hold the hot pack on his gum to help with pain. Pt continued behavior stating that smacking it helps. Requesting this nurse to give him extras to take home. This nurse provided patient with one to go home with but educated on using warm wash cloths ect when at home.

## 2018-10-29 NOTE — ED Notes (Signed)
Pt asking why he can not have any more IV pain medication, this nurse informed that this is what the provider has ordered and patient agreed to take oral medication.

## 2018-10-29 NOTE — ED Notes (Signed)
When attempting to discharge patient, he states he needs another dose of Dilaudid for his dental pain. This nurse explained that he was up for discharge and I would ask the provider, pt continues to constantly ask for pain medication throughout converstation.

## 2018-10-29 NOTE — Discharge Instructions (Addendum)
Begin taking clindamycin as prescribed.  Continue other medications as previously prescribed.  Follow-up with your primary doctor if symptoms or not improving in the next few days.

## 2018-10-29 NOTE — ED Notes (Signed)
Pt provided with [place] 

## 2018-11-02 ENCOUNTER — Telehealth (HOSPITAL_COMMUNITY): Payer: Self-pay | Admitting: General Practice

## 2018-11-02 ENCOUNTER — Encounter (HOSPITAL_COMMUNITY): Payer: Self-pay | Admitting: General Practice

## 2018-11-02 ENCOUNTER — Non-Acute Institutional Stay (HOSPITAL_COMMUNITY)
Admission: AD | Admit: 2018-11-02 | Discharge: 2018-11-02 | Disposition: A | Discharge status: Home / Self Care | Payer: Medicaid Other | Source: Ambulatory Visit | Attending: Internal Medicine | Admitting: Internal Medicine

## 2018-11-02 DIAGNOSIS — Z79899 Other long term (current) drug therapy: Secondary | ICD-10-CM | POA: Diagnosis not present

## 2018-11-02 DIAGNOSIS — Z791 Long term (current) use of non-steroidal anti-inflammatories (NSAID): Secondary | ICD-10-CM | POA: Diagnosis not present

## 2018-11-02 DIAGNOSIS — Z7982 Long term (current) use of aspirin: Secondary | ICD-10-CM | POA: Diagnosis not present

## 2018-11-02 DIAGNOSIS — D638 Anemia in other chronic diseases classified elsewhere: Secondary | ICD-10-CM | POA: Insufficient documentation

## 2018-11-02 DIAGNOSIS — D5700 Hb-SS disease with crisis, unspecified: Secondary | ICD-10-CM | POA: Diagnosis not present

## 2018-11-02 DIAGNOSIS — F913 Oppositional defiant disorder: Secondary | ICD-10-CM | POA: Diagnosis not present

## 2018-11-02 DIAGNOSIS — F1120 Opioid dependence, uncomplicated: Secondary | ICD-10-CM | POA: Insufficient documentation

## 2018-11-02 DIAGNOSIS — G894 Chronic pain syndrome: Secondary | ICD-10-CM | POA: Insufficient documentation

## 2018-11-02 LAB — CBC WITH DIFFERENTIAL/PLATELET
Abs Immature Granulocytes: 0.07 10*3/uL (ref 0.00–0.07)
Basophils Absolute: 0 10*3/uL (ref 0.0–0.1)
Basophils Relative: 1 %
Eosinophils Absolute: 0.3 10*3/uL (ref 0.0–0.5)
Eosinophils Relative: 4 %
HCT: 26.4 % — ABNORMAL LOW (ref 39.0–52.0)
Hemoglobin: 8.9 g/dL — ABNORMAL LOW (ref 13.0–17.0)
Immature Granulocytes: 1 %
Lymphocytes Relative: 28 %
Lymphs Abs: 2.1 10*3/uL (ref 0.7–4.0)
MCH: 30.7 pg (ref 26.0–34.0)
MCHC: 33.7 g/dL (ref 30.0–36.0)
MCV: 91 fL (ref 80.0–100.0)
Monocytes Absolute: 0.8 10*3/uL (ref 0.1–1.0)
Monocytes Relative: 11 %
Neutro Abs: 4.3 10*3/uL (ref 1.7–7.7)
Neutrophils Relative %: 55 %
Platelets: 491 10*3/uL — ABNORMAL HIGH (ref 150–400)
RBC: 2.9 MIL/uL — ABNORMAL LOW (ref 4.22–5.81)
RDW: 18.2 % — ABNORMAL HIGH (ref 11.5–15.5)
WBC: 7.7 10*3/uL (ref 4.0–10.5)
nRBC: 3.7 % — ABNORMAL HIGH (ref 0.0–0.2)

## 2018-11-02 LAB — LACTATE DEHYDROGENASE: LDH: 271 U/L — ABNORMAL HIGH (ref 98–192)

## 2018-11-02 LAB — RETICULOCYTES
Immature Retic Fract: 43.4 % — ABNORMAL HIGH (ref 2.3–15.9)
RBC.: 2.9 MIL/uL — ABNORMAL LOW (ref 4.22–5.81)
Retic Count, Absolute: 369.2 10*3/uL — ABNORMAL HIGH (ref 19.0–186.0)
Retic Ct Pct: 12.7 % — ABNORMAL HIGH (ref 0.4–3.1)

## 2018-11-02 MED ORDER — SODIUM CHLORIDE 0.45 % IV SOLN
INTRAVENOUS | Status: DC
  Administered 2018-11-02: 11:00:00 via INTRAVENOUS

## 2018-11-02 MED ORDER — DIPHENHYDRAMINE HCL 25 MG PO CAPS
25.0000 mg | ORAL_CAPSULE | ORAL | Status: DC | PRN
  Administered 2018-11-02: 25 mg via ORAL
  Filled 2018-11-02: qty 1

## 2018-11-02 MED ORDER — ONDANSETRON HCL 4 MG/2ML IJ SOLN
4.0000 mg | Freq: Four times a day (QID) | INTRAMUSCULAR | Status: DC | PRN
  Administered 2018-11-02: 4 mg via INTRAVENOUS
  Filled 2018-11-02: qty 2

## 2018-11-02 MED ORDER — HYDROMORPHONE 1 MG/ML IV SOLN
INTRAVENOUS | Status: DC
  Administered 2018-11-02: 30 mg via INTRAVENOUS
  Administered 2018-11-02: 12.5 mg via INTRAVENOUS
  Filled 2018-11-02: qty 30

## 2018-11-02 MED ORDER — SENNOSIDES-DOCUSATE SODIUM 8.6-50 MG PO TABS: 1.0000 | ORAL_TABLET | Freq: Two times a day (BID) | ORAL | Status: DC

## 2018-11-02 MED ORDER — SODIUM CHLORIDE 0.9 % IV SOLN
25.0000 mg | INTRAVENOUS | Status: DC | PRN
  Filled 2018-11-02: qty 0.5

## 2018-11-02 MED ORDER — ACETAMINOPHEN 650 MG RE SUPP
650.0000 mg | RECTAL | Status: DC | PRN
  Filled 2018-11-02: qty 1

## 2018-11-02 MED ORDER — POLYETHYLENE GLYCOL 3350 17 G PO PACK: 17.0000 g | PACK | Freq: Every day | ORAL | Status: DC | PRN

## 2018-11-02 MED ORDER — NALOXONE HCL 0.4 MG/ML IJ SOLN: 0.4000 mg | INTRAMUSCULAR | Status: DC | PRN

## 2018-11-02 MED ORDER — ACETAMINOPHEN 325 MG PO TABS
650.0000 mg | ORAL_TABLET | ORAL | Status: DC | PRN
  Administered 2018-11-02: 650 mg via ORAL
  Filled 2018-11-02: qty 2

## 2018-11-02 MED ORDER — SODIUM CHLORIDE 0.9% FLUSH: 9.0000 mL | INTRAVENOUS | Status: DC | PRN

## 2018-11-02 NOTE — H&P (Signed)
[facility] History and Physical  Kewan Wojno [id number] DOB: 1990-12-21 DOA: 11/02/2018  PCP: Lanae Boast, FNP   Chief Complaint: Sickle Cell Pain  HPI: Timothy Marks is a 28 y.o. male with history of sickle cell disease, chronic pain syndrome, oppositional defiance disorder, opiate dependence tolerance and anemia of chronic disease who presented to the day hospital today with major complaint of pain in both upper and lower extremities and his lower back which is similar to his previous sickle cell pain crisis.  Patient has been having this pain for about 3 days with increasing intensity, attributed to change in weather no longer responding to home pain medication.  He rates his pain at 9/10, constant and throbbing.  No joint swelling or redness.  Patient denies fever, chills, sore throat, persistent cough, chest pain, shortness of breath, dysuria, nausea, vomiting or diarrhea.  Patient also denies sick contact, recent travel or exposure to COVID-19.  Systemic Review: General: The patient denies anorexia, fever, weight loss Cardiac: Denies chest pain, syncope, palpitations, pedal edema  Respiratory: Denies cough, shortness of breath, wheezing GI: Denies severe indigestion/heartburn, abdominal pain, nausea, vomiting, diarrhea and constipation GU: Denies hematuria, incontinence, dysuria  Musculoskeletal: Denies arthritis  Skin: Denies suspicious skin lesions Neurologic: Denies focal weakness or numbness, change in vision  Past Medical History:  Diagnosis Date  . Depression   . 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 Sickle cell anemia (HCC)   . Vitamin D deficiency y-6    Past Surgical History:  Procedure Laterality Date  . CHOLECYSTECTOMY      Allergies  Allergen Reactions  . Buprenorphine Hcl Hives  . Levaquin [Levofloxacin] Itching  . Meperidine Rash  . Morphine And Related Hives  . Toradol  [Ketorolac Tromethamine] Itching  . Tramadol Hives  . Vancomycin Itching  . Zosyn [Piperacillin Sod-Tazobactam So] Itching and Rash    Has taken rocephin in past    Family History  Problem Relation Age of Onset  . Sickle cell trait Mother   . Sickle cell trait Father       Prior to Admission medications   Medication Sig Start Date End Date Taking? Authorizing Provider  aspirin EC 81 MG tablet Take 1 tablet (81 mg total) by mouth daily. Patient not taking: Reported on 10/11/2018 06/02/17   Dorena Dew, FNP  cholecalciferol (VITAMIN D) 1000 units tablet Take 2 tablets (2,000 Units total) by mouth daily. 12/13/17   Azzie Glatter, FNP  clindamycin (CLEOCIN) 300 MG capsule Take 1 capsule (300 mg total) by mouth 4 (four) times daily. X 7 days 10/29/18   Veryl Speak, MD  folic acid (FOLVITE) 1 MG tablet Take 1 tablet (1 mg total) by mouth daily. 05/01/17   Dorena Dew, FNP  gabapentin (NEURONTIN) 300 MG capsule TAKE 1 CAPSULE(300 MG) BY MOUTH THREE TIMES DAILY Patient taking differently: Take 300 mg by mouth 3 (three) times daily.  05/08/18   Lanae Boast, FNP  hydroxyurea (HYDREA) 500 MG capsule Take 1 capsule (500 mg total) by mouth 2 (two) times daily. May take with food to minimize GI side effects. 05/01/17   Dorena Dew, FNP  ibuprofen (ADVIL,MOTRIN) 800 MG tablet Take 1 tablet (800 mg total) by mouth 3 (three) times daily as needed (pain). 12/20/17   Azzie Glatter, FNP  oxyCODONE (OXYCONTIN) 20 mg 12 hr tablet Take 1 tablet (20 mg total) by mouth every 12 (twelve) hours.  10/24/18 11/25/18  Lanae Boast, FNP  oxyCODONE-acetaminophen (PERCOCET) 10-325 MG tablet Take 1 tablet by mouth every 6 (six) hours as needed for up to 15 days for pain. 10/24/18 11/08/18  Lanae Boast, FNP     Physical Exam: There were no vitals filed for this visit.  General: Alert, awake, afebrile, anicteric, not in obvious distress HEENT: Normocephalic and Atraumatic, Mucous membranes  pink                PERRLA; EOM intact; No scleral icterus,                 Nares: Patent, Oropharynx: Clear, Fair Dentition                 Neck: FROM, no cervical lymphadenopathy, thyromegaly, carotid bruit or JVD;  CHEST WALL: No tenderness  CHEST: Normal respiration, clear to auscultation bilaterally  HEART: Regular rate and rhythm; no murmurs rubs or gallops  BACK: No kyphosis or scoliosis; no CVA tenderness  ABDOMEN: Positive Bowel Sounds, soft, non-tender; no masses, no organomegaly EXTREMITIES: No cyanosis, clubbing, or edema SKIN:  no rash or ulceration  CNS: Alert and Oriented x 4, Nonfocal exam, CN 2-12 intact  Labs on Admission:  Basic Metabolic Panel: Recent Labs  Lab 10/29/18 0430  NA 140  K 4.5  CL 102  CO2 27  GLUCOSE 91  BUN 9  CREATININE 0.54*  CALCIUM 9.6   Liver Function Tests: No results for input(s): AST, ALT, ALKPHOS, BILITOT, PROT, ALBUMIN in the last 168 hours. No results for input(s): LIPASE, AMYLASE in the last 168 hours. No results for input(s): AMMONIA in the last 168 hours. CBC: Recent Labs  Lab 10/29/18 0430  WBC 14.0*  NEUTROABS 9.1*  HGB 10.0*  HCT 29.2*  MCV 91.5  PLT 513*   Cardiac Enzymes: No results for input(s): CKTOTAL, CKMB, CKMBINDEX, TROPONINI in the last 168 hours.  BNP (last 3 results) No results for input(s): BNP in the last 8760 hours.  ProBNP (last 3 results) No results for input(s): PROBNP in the last 8760 hours.  CBG: No results for input(s): GLUCAP in the last 168 hours.   Assessment/Plan Active Problems:   Sickle cell anemia with crisis ([place])   Admits to the Day Hospital  IVF D5 .45% Saline @ 125 mls/hour  Weight based Dilaudid PCA started within 30 minutes of admission  IV Toradol 15 mg Q 6 H  Monitor vitals very closely, Re-evaluate pain scale every hour  2 L of Oxygen by   Patient will be re-evaluated for pain in the context of function and relationship to baseline as care progresses.  If  no significant relieve from pain (remains above 5/10) will transfer patient to inpatient services for further evaluation and management  Code Status: Full  Family Communication: None  DVT Prophylaxis: Ambulate as tolerated   Time spent: 35 Minutes  Angelica Chessman, MD, MHA, FACP, FAAP, CPE  If 7PM-7AM, please contact night-coverage [email or web address] 11/02/2018, 10:53 AM

## 2018-11-02 NOTE — Telephone Encounter (Signed)
Patient called, complained of pain in the legs and back rated at 9/10. Denied chest pain, fever, diarrhea, abdominal pain, nausea/vomitting. Screened negative for Covid-19 symptoms. Admitted to having means of transportation without driving self after treatment. Last took 1 tablet of 10-325 mg percocet. Per provider, patient can come to the day hospital for treatment. Patient notified, verbalized understanding.

## 2018-11-02 NOTE — Discharge Summary (Signed)
Physician Discharge Summary  Timothy Marks [id number] DOB: [birth year] DOA: 11/02/2018  PCP: Lanae Boast, FNP  Admit date: 11/02/2018  Discharge date: 11/02/2018  Time spent: 30 minutes  Discharge Diagnoses:  Active Problems:   Sickle cell anemia with crisis [facility])   Discharge Condition: Stable  Diet recommendation: Regular  There were no vitals filed for this visit.  History of present illness:  Timothy Marks is a 28 y.o. male with history of sickle cell disease, chronic pain syndrome, oppositional defiance disorder, opiate dependence tolerance and anemia of chronic disease who presented to the day hospital today with major complaint of pain in both upper and lower extremities and his lower back which is similar to his previous sickle cell pain crisis.  Patient has been having this pain for about 3 days with increasing intensity, attributed to change in weather no longer responding to home pain medication.  He rates his pain at 9/10, constant and throbbing.  No joint swelling or redness.  Patient denies fever, chills, sore throat, persistent cough, chest pain, shortness of breath, dysuria, nausea, vomiting or diarrhea.  Patient also denies sick contact, recent travel or exposure to Timothy Marks Hospital Course:  Timothy Marks was admitted to the day hospital with sickle cell painful crisis. Patient was treated with weight based IV Dilaudid PCA, IV Toradol, clinician assisted doses as deemed appropriate and IV fluids. Timothy Marks showed significant improvement symptomatically, pain improved from 9 to 5/10 at the time of discharge. Patient was discharged home in a hemodynamically stable condition. Timothy Marks will follow-up at the clinic as previously scheduled, continue with home medications as per prior to admission.  Discharge Instructions We discussed the need for good hydration, monitoring of hydration status, avoidance of heat, cold, stress, and infection triggers. We discussed the need to  be compliant with taking Hydrea and other home medications. Timothy Marks was reminded of the need to seek medical attention immediately if any symptom of bleeding, anemia, or infection occurs.  Discharge Exam: Vitals:   11/02/18 1114 11/02/18 1330  BP: (!) 122/58 108/61  Pulse: 75 80  Resp: (!) 22 13  Temp: 99.2 F (37.3 C)   SpO2: 100% 97%   General appearance: alert, cooperative and no distress Eyes: conjunctivae/corneas clear. PERRL, EOM's intact. Fundi benign. Neck: no adenopathy, no carotid bruit, no JVD, supple, symmetrical, trachea midline and thyroid not enlarged, symmetric, no tenderness/mass/nodules Back: symmetric, no curvature. ROM normal. No CVA tenderness. Resp: clear to auscultation bilaterally Chest wall: no tenderness Cardio: regular rate and rhythm, S1, S2 normal, no murmur, click, rub or gallop GI: soft, non-tender; bowel sounds normal; no masses,  no organomegaly Extremities: extremities normal, atraumatic, no cyanosis or edema Pulses: 2+ and symmetric Skin: Skin color, texture, turgor normal. No rashes or lesions Neurologic: Grossly normal  Discharge Instructions    Diet - low sodium heart healthy   Complete by: As directed    Increase activity slowly   Complete by: As directed      Allergies as of 11/02/2018      Reactions   Buprenorphine Hcl Hives   Levaquin [levofloxacin] Itching   Meperidine Rash   Morphine And Related Hives   Toradol [ketorolac Tromethamine] Itching   Tramadol Hives   Vancomycin Itching   Zosyn [piperacillin Sod-tazobactam So] Itching, Rash   Has taken rocephin in past      Medication List    TAKE these medications   aspirin EC 81 MG tablet Take 1 tablet (81 mg total) by mouth daily.  cholecalciferol 25 MCG (1000 UT) tablet Commonly known as: VITAMIN D Take 2 tablets (2,000 Units total) by mouth daily.   clindamycin 300 MG capsule Commonly known as: CLEOCIN Take 1 capsule (300 mg total) by mouth 4 (four) times daily. X 7  days   folic acid 1 MG tablet Commonly known as: FOLVITE Take 1 tablet (1 mg total) by mouth daily.   gabapentin 300 MG capsule Commonly known as: NEURONTIN TAKE 1 CAPSULE(300 MG) BY MOUTH THREE TIMES DAILY What changed: See the new instructions.   hydroxyurea 500 MG capsule Commonly known as: HYDREA Take 1 capsule (500 mg total) by mouth 2 (two) times daily. May take with food to minimize GI side effects.   ibuprofen 800 MG tablet Commonly known as: ADVIL Take 1 tablet (800 mg total) by mouth 3 (three) times daily as needed (pain).   oxyCODONE 20 mg 12 hr tablet Commonly known as: OXYCONTIN Take 1 tablet (20 mg total) by mouth every 12 (twelve) hours.   oxyCODONE-acetaminophen 10-325 MG tablet Commonly known as: PERCOCET Take 1 tablet by mouth every 6 (six) hours as needed for up to 15 days for pain.      Allergies  Allergen Reactions  . Buprenorphine Hcl Hives  . Levaquin [Levofloxacin] Itching  . Meperidine Rash  . Morphine And Related Hives  . Toradol [Ketorolac Tromethamine] Itching  . Tramadol Hives  . Vancomycin Itching  . Zosyn [Piperacillin Sod-Tazobactam So] Itching and Rash    Has taken rocephin in past   Follow-up Information    Lanae Boast, [facility]. Call in 1 week(s).   Specialty: Family Medicine Contact information: [place] [address] [phone]           Significant Diagnostic Studies: Dg Chest Port 1 View  Result Date: 10/11/2018 CLINICAL DATA:  Sickle cell crisis EXAM: PORTABLE CHEST 1 VIEW COMPARISON:  05/22/2018 FINDINGS: Heart is borderline in size. Mild vascular congestion. No confluent opacities, effusions or edema. No acute bony abnormality. IMPRESSION: Borderline heart size, mild vascular congestion. Electronically Signed   By: Rolm Baptise M.D.   On: 10/11/2018 21:00    Signed:  Angelica Chessman MD, [place], [place], [place], CPE   11/02/2018, 3:51 PM

## 2018-11-02 NOTE — Discharge Instructions (Signed)
Sickle Cell Anemia, Adult ° °Sickle cell anemia is a condition where your red blood cells are shaped like sickles. Red blood cells carry oxygen through the body. Sickle-shaped cells do not live as long as normal red blood cells. They also clump together and block blood from flowing through the blood vessels. This prevents the body from getting enough oxygen. Sickle cell anemia causes organ damage and pain. It also increases the risk of infection. °Follow these instructions at home: °Medicines °· Take over-the-counter and prescription medicines only as told by your doctor. °· If you were prescribed an antibiotic medicine, take it as told by your doctor. Do not stop taking the antibiotic even if you start to feel better. °· If you develop a fever, do not take medicines to lower the fever right away. Tell your doctor about the fever. °Managing pain, stiffness, and swelling °· Try these methods to help with pain: °? Use a heating pad. °? Take a warm bath. °? Distract yourself, such as by watching TV. °Eating and drinking °· Drink enough fluid to keep your pee (urine) clear or pale yellow. Drink more in hot weather and during exercise. °· Limit or avoid alcohol. °· Eat a healthy diet. Eat plenty of fruits, vegetables, whole grains, and lean protein. °· Take vitamins and supplements as told by your doctor. °Traveling °· When traveling, keep these with you: °? Your medical information. °? The names of your doctors. °? Your medicines. °· If you need to take an airplane, talk to your doctor first. °Activity °· Rest often. °· Avoid exercises that make your heart beat much faster, such as jogging. °General instructions °· Do not use products that have nicotine or tobacco, such as cigarettes and e-cigarettes. If you need help quitting, ask your doctor. °· Consider wearing a medical alert bracelet. °· Avoid being in high places (high altitudes), such as mountains. °· Avoid very hot or cold temperatures. °· Avoid places where the  temperature changes a lot. °· Keep all follow-up visits as told by your doctor. This is important. °Contact a doctor if: °· A joint hurts. °· Your feet or hands hurt or swell. °· You feel tired (fatigued). °Get help right away if: °· You have symptoms of infection. These include: °? Fever. °? Chills. °? Being very tired. °? Irritability. °? Poor eating. °? Throwing up (vomiting). °· You feel dizzy or faint. °· You have new stomach pain, especially on the left side. °· You have a an erection (priapism) that lasts more than 4 hours. °· You have numbness in your arms or legs. °· You have a hard time moving your arms or legs. °· You have trouble talking. °· You have pain that does not go away when you take medicine. °· You are short of breath. °· You are breathing fast. °· You have a long-term cough. °· You have pain in your chest. °· You have a bad headache. °· You have a stiff neck. °· Your stomach looks bloated even though you did not eat much. °· Your skin is pale. °· You suddenly cannot see well. °Summary °· Sickle cell anemia is a condition where your red blood cells are shaped like sickles. °· Follow your doctor's advice on ways to manage pain, food to eat, activities to do, and steps to take for safe travel. °· Get medical help right away if you have any signs of infection, such as a fever. °This information is not intended to replace advice given to you by   your health care provider. Make sure you discuss any questions you have with your health care provider. °Document Released: 11/21/2012 Document Revised: 05/25/2018 Document Reviewed: 03/08/2016 °Elsevier Patient Education © 2020 [employer] ° °

## 2018-11-02 NOTE — Progress Notes (Signed)
Patient admitted to the day hospital for treatment of sickle cell pain crisis. Patient reported back and legs pain rated 9/10. Patient placed on Dilaudid PCA, given PO Tylenol, PO Benadryl, IV Zofran and hydrated with IV fluids. At discharge patient reported  pain at 5/10. Discharge instructions given to patient. Patient alert, oriented and ambulatory at discharge.

## 2018-11-03 ENCOUNTER — Encounter (HOSPITAL_COMMUNITY): Payer: Self-pay

## 2018-11-03 ENCOUNTER — Inpatient Hospital Stay (HOSPITAL_COMMUNITY)
Admission: EM | Admit: 2018-11-03 | Discharge: 2018-11-11 | DRG: 812 | Disposition: A | Discharge status: Home / Self Care | Payer: Medicaid Other | Attending: Internal Medicine | Admitting: Internal Medicine

## 2018-11-03 ENCOUNTER — Other Ambulatory Visit: Payer: Self-pay

## 2018-11-03 DIAGNOSIS — D571 Sickle-cell disease without crisis: Secondary | ICD-10-CM

## 2018-11-03 DIAGNOSIS — Z20828 Contact with and (suspected) exposure to other viral communicable diseases: Secondary | ICD-10-CM | POA: Diagnosis present

## 2018-11-03 DIAGNOSIS — Z79899 Other long term (current) drug therapy: Secondary | ICD-10-CM

## 2018-11-03 DIAGNOSIS — R945 Abnormal results of liver function studies: Secondary | ICD-10-CM | POA: Diagnosis present

## 2018-11-03 DIAGNOSIS — D638 Anemia in other chronic diseases classified elsewhere: Secondary | ICD-10-CM | POA: Diagnosis present

## 2018-11-03 DIAGNOSIS — M879 Osteonecrosis, unspecified: Secondary | ICD-10-CM | POA: Diagnosis present

## 2018-11-03 DIAGNOSIS — D75839 Thrombocytosis, unspecified: Secondary | ICD-10-CM | POA: Diagnosis present

## 2018-11-03 DIAGNOSIS — Z681 Body mass index (BMI) 19 or less, adult: Secondary | ICD-10-CM

## 2018-11-03 DIAGNOSIS — F418 Other specified anxiety disorders: Secondary | ICD-10-CM | POA: Diagnosis present

## 2018-11-03 DIAGNOSIS — R740 Nonspecific elevation of levels of transaminase and lactic acid dehydrogenase [LDH]: Secondary | ICD-10-CM | POA: Diagnosis present

## 2018-11-03 DIAGNOSIS — D5700 Hb-SS disease with crisis, unspecified: Secondary | ICD-10-CM | POA: Diagnosis present

## 2018-11-03 DIAGNOSIS — E559 Vitamin D deficiency, unspecified: Secondary | ICD-10-CM | POA: Diagnosis present

## 2018-11-03 DIAGNOSIS — D473 Essential (hemorrhagic) thrombocythemia: Secondary | ICD-10-CM | POA: Diagnosis present

## 2018-11-03 DIAGNOSIS — Z832 Family history of diseases of the blood and blood-forming organs and certain disorders involving the immune mechanism: Secondary | ICD-10-CM

## 2018-11-03 DIAGNOSIS — Z888 Allergy status to other drugs, medicaments and biological substances status: Secondary | ICD-10-CM

## 2018-11-03 DIAGNOSIS — Z885 Allergy status to narcotic agent status: Secondary | ICD-10-CM

## 2018-11-03 DIAGNOSIS — D72829 Elevated white blood cell count, unspecified: Secondary | ICD-10-CM | POA: Diagnosis not present

## 2018-11-03 DIAGNOSIS — F913 Oppositional defiant disorder: Secondary | ICD-10-CM | POA: Diagnosis present

## 2018-11-03 DIAGNOSIS — R630 Anorexia: Secondary | ICD-10-CM | POA: Diagnosis present

## 2018-11-03 DIAGNOSIS — F1120 Opioid dependence, uncomplicated: Secondary | ICD-10-CM | POA: Diagnosis present

## 2018-11-03 DIAGNOSIS — Z881 Allergy status to other antibiotic agents status: Secondary | ICD-10-CM

## 2018-11-03 DIAGNOSIS — Z7982 Long term (current) use of aspirin: Secondary | ICD-10-CM

## 2018-11-03 DIAGNOSIS — G894 Chronic pain syndrome: Secondary | ICD-10-CM | POA: Diagnosis present

## 2018-11-03 LAB — COMPREHENSIVE METABOLIC PANEL
ALT: 12 U/L (ref 0–44)
AST: 65 U/L — ABNORMAL HIGH (ref 15–41)
Albumin: 4.3 g/dL (ref 3.5–5.0)
Alkaline Phosphatase: 68 U/L (ref 38–126)
Anion gap: 9 (ref 5–15)
BUN: 7 mg/dL (ref 6–20)
CO2: 25 mmol/L (ref 22–32)
Calcium: 8.9 mg/dL (ref 8.9–10.3)
Chloride: 104 mmol/L (ref 98–111)
Creatinine, Ser: 0.42 mg/dL — ABNORMAL LOW (ref 0.61–1.24)
GFR calc Af Amer: >60 mL/min (ref >60)
GFR calc non Af Amer: >60 mL/min (ref >60)
Glucose, Bld: 91 mg/dL (ref 70–99)
Potassium: 5 mmol/L (ref 3.5–5.1)
Sodium: 138 mmol/L (ref 135–145)
Total Bilirubin: 3.6 mg/dL — ABNORMAL HIGH (ref 0.3–1.2)
Total Protein: 7.3 g/dL (ref 6.5–8.1)

## 2018-11-03 NOTE — ED Triage Notes (Signed)
Pt arrived EMS c/o generalized sickle cell pain, percocet 2x day with minimal relief.  20 LAC, 4mg  morphine and 500L NS given with EMS.

## 2018-11-04 ENCOUNTER — Encounter (HOSPITAL_COMMUNITY): Payer: Self-pay

## 2018-11-04 ENCOUNTER — Observation Stay (HOSPITAL_COMMUNITY): Payer: Medicaid Other

## 2018-11-04 DIAGNOSIS — R945 Abnormal results of liver function studies: Secondary | ICD-10-CM | POA: Diagnosis present

## 2018-11-04 DIAGNOSIS — D473 Essential (hemorrhagic) thrombocythemia: Secondary | ICD-10-CM | POA: Diagnosis not present

## 2018-11-04 DIAGNOSIS — D5700 Hb-SS disease with crisis, unspecified: Secondary | ICD-10-CM | POA: Diagnosis not present

## 2018-11-04 LAB — CBC WITH DIFFERENTIAL/PLATELET
Abs Immature Granulocytes: 0.21 10*3/uL — ABNORMAL HIGH (ref 0.00–0.07)
Basophils Absolute: 0.1 10*3/uL (ref 0.0–0.1)
Basophils Relative: 0 %
Eosinophils Absolute: 0.5 10*3/uL (ref 0.0–0.5)
Eosinophils Relative: 4 %
HCT: 25.5 % — ABNORMAL LOW (ref 39.0–52.0)
Hemoglobin: 8.6 g/dL — ABNORMAL LOW (ref 13.0–17.0)
Immature Granulocytes: 2 %
Lymphocytes Relative: 35 %
Lymphs Abs: 4.6 10*3/uL — ABNORMAL HIGH (ref 0.7–4.0)
MCH: 31.2 pg (ref 26.0–34.0)
MCHC: 33.7 g/dL (ref 30.0–36.0)
MCV: 92.4 fL (ref 80.0–100.0)
Monocytes Absolute: 1.4 10*3/uL — ABNORMAL HIGH (ref 0.1–1.0)
Monocytes Relative: 10 %
Neutro Abs: 6.6 10*3/uL (ref 1.7–7.7)
Neutrophils Relative %: 49 %
Platelets: 453 10*3/uL — ABNORMAL HIGH (ref 150–400)
RBC: 2.76 MIL/uL — ABNORMAL LOW (ref 4.22–5.81)
RDW: 19.2 % — ABNORMAL HIGH (ref 11.5–15.5)
WBC: 13.3 10*3/uL — ABNORMAL HIGH (ref 4.0–10.5)
nRBC: 6 % — ABNORMAL HIGH (ref 0.0–0.2)

## 2018-11-04 LAB — COMPREHENSIVE METABOLIC PANEL
ALT: 15 U/L (ref 0–44)
AST: 38 U/L (ref 15–41)
Albumin: 4.3 g/dL (ref 3.5–5.0)
Alkaline Phosphatase: 68 U/L (ref 38–126)
Anion gap: 11 (ref 5–15)
BUN: 7 mg/dL (ref 6–20)
CO2: 22 mmol/L (ref 22–32)
Calcium: 9 mg/dL (ref 8.9–10.3)
Chloride: 106 mmol/L (ref 98–111)
Creatinine, Ser: 0.5 mg/dL — ABNORMAL LOW (ref 0.61–1.24)
GFR calc Af Amer: >60 mL/min (ref >60)
GFR calc non Af Amer: >60 mL/min (ref >60)
Glucose, Bld: 87 mg/dL (ref 70–99)
Potassium: 4 mmol/L (ref 3.5–5.1)
Sodium: 139 mmol/L (ref 135–145)
Total Bilirubin: 3.7 mg/dL — ABNORMAL HIGH (ref 0.3–1.2)
Total Protein: 7.4 g/dL (ref 6.5–8.1)

## 2018-11-04 LAB — RETICULOCYTES
Immature Retic Fract: 44.6 % — ABNORMAL HIGH (ref 2.3–15.9)
RBC.: 2.76 MIL/uL — ABNORMAL LOW (ref 4.22–5.81)
Retic Count, Absolute: 404.5 10*3/uL — ABNORMAL HIGH (ref 19.0–186.0)
Retic Ct Pct: 13.9 % — ABNORMAL HIGH (ref 0.4–3.1)

## 2018-11-04 LAB — CBC
HCT: 25.2 % — ABNORMAL LOW (ref 39.0–52.0)
Hemoglobin: 8.5 g/dL — ABNORMAL LOW (ref 13.0–17.0)
MCH: 31.3 pg (ref 26.0–34.0)
MCHC: 33.7 g/dL (ref 30.0–36.0)
MCV: 92.6 fL (ref 80.0–100.0)
Platelets: 449 10*3/uL — ABNORMAL HIGH (ref 150–400)
RBC: 2.72 MIL/uL — ABNORMAL LOW (ref 4.22–5.81)
RDW: 19.7 % — ABNORMAL HIGH (ref 11.5–15.5)
WBC: 16.9 10*3/uL — ABNORMAL HIGH (ref 4.0–10.5)
nRBC: 6 % — ABNORMAL HIGH (ref 0.0–0.2)

## 2018-11-04 LAB — SARS CORONAVIRUS 2 (TAT 6-24 HRS): SARS Coronavirus 2: NEGATIVE

## 2018-11-04 MED ORDER — HYDROMORPHONE HCL 2 MG/ML IJ SOLN: 2.0000 mg | INTRAMUSCULAR | Status: AC

## 2018-11-04 MED ORDER — SODIUM CHLORIDE 0.45 % IV SOLN
INTRAVENOUS | Status: DC
  Administered 2018-11-04: 05:00:00 via INTRAVENOUS

## 2018-11-04 MED ORDER — NALOXONE HCL 0.4 MG/ML IJ SOLN: 0.4000 mg | INTRAMUSCULAR | Status: DC | PRN

## 2018-11-04 MED ORDER — HYDROMORPHONE HCL 2 MG/ML IJ SOLN
2.0000 mg | INTRAMUSCULAR | Status: AC
  Administered 2018-11-04: 01:00:00 2 mg via INTRAVENOUS
  Filled 2018-11-04: qty 1

## 2018-11-04 MED ORDER — HYDROMORPHONE HCL 2 MG/ML IJ SOLN
2.0000 mg | INTRAMUSCULAR | Status: AC
  Filled 2018-11-04: qty 1

## 2018-11-04 MED ORDER — GABAPENTIN 300 MG PO CAPS
300.0000 mg | ORAL_CAPSULE | Freq: Three times a day (TID) | ORAL | Status: DC
  Administered 2018-11-04 – 2018-11-11 (×22): 300 mg via ORAL
  Filled 2018-11-04 (×21): qty 1

## 2018-11-04 MED ORDER — HYDROXYUREA 500 MG PO CAPS
500.0000 mg | ORAL_CAPSULE | Freq: Two times a day (BID) | ORAL | Status: DC
  Administered 2018-11-04 – 2018-11-11 (×15): 500 mg via ORAL
  Filled 2018-11-04 (×15): qty 1

## 2018-11-04 MED ORDER — SENNOSIDES-DOCUSATE SODIUM 8.6-50 MG PO TABS
1.0000 | ORAL_TABLET | Freq: Two times a day (BID) | ORAL | Status: DC
  Administered 2018-11-05 – 2018-11-11 (×12): 1 via ORAL
  Filled 2018-11-04 (×14): qty 1

## 2018-11-04 MED ORDER — SODIUM CHLORIDE 0.9% FLUSH: 9.0000 mL | INTRAVENOUS | Status: DC | PRN

## 2018-11-04 MED ORDER — DIPHENHYDRAMINE HCL 25 MG PO CAPS
25.0000 mg | ORAL_CAPSULE | ORAL | Status: DC | PRN
  Administered 2018-11-04 – 2018-11-05 (×2): 25 mg via ORAL
  Filled 2018-11-04 (×3): qty 1

## 2018-11-04 MED ORDER — DEXTROSE-NACL 5-0.45 % IV SOLN
INTRAVENOUS | Status: DC
  Administered 2018-11-04 – 2018-11-06 (×6): via INTRAVENOUS

## 2018-11-04 MED ORDER — HYDROMORPHONE HCL 2 MG/ML IJ SOLN
2.0000 mg | INTRAMUSCULAR | Status: AC
  Administered 2018-11-04: 2 mg via INTRAVENOUS

## 2018-11-04 MED ORDER — DIPHENHYDRAMINE HCL 25 MG PO CAPS
25.0000 mg | ORAL_CAPSULE | ORAL | Status: DC | PRN
  Administered 2018-11-04: 50 mg via ORAL
  Filled 2018-11-04: qty 2

## 2018-11-04 MED ORDER — HYDROMORPHONE 1 MG/ML IV SOLN
INTRAVENOUS | Status: DC
  Administered 2018-11-04: 6 mg via INTRAVENOUS
  Administered 2018-11-04: 30 mg via INTRAVENOUS
  Administered 2018-11-04: 4 mg via INTRAVENOUS
  Administered 2018-11-04: 30 mg via INTRAVENOUS
  Administered 2018-11-04: 6 mg via INTRAVENOUS
  Administered 2018-11-05: 1 mg via INTRAVENOUS
  Administered 2018-11-05: 30 mg via INTRAVENOUS
  Administered 2018-11-05: 1 mg via INTRAVENOUS
  Administered 2018-11-05: 2 mg via INTRAVENOUS
  Administered 2018-11-05: 7.5 mg via INTRAVENOUS
  Administered 2018-11-05: 4 mg via INTRAVENOUS
  Administered 2018-11-06 (×3): 1.5 mg via INTRAVENOUS
  Administered 2018-11-06: 7.5 mL via INTRAVENOUS
  Administered 2018-11-06: 3.5 mg via INTRAVENOUS
  Administered 2018-11-07 (×2): 4 mg via INTRAVENOUS
  Administered 2018-11-07: 30 mg via INTRAVENOUS
  Administered 2018-11-07: 1 mg via INTRAVENOUS
  Administered 2018-11-07: 5.4 mg via INTRAVENOUS
  Administered 2018-11-07: 6 mg via INTRAVENOUS
  Administered 2018-11-07: 1 mg via INTRAVENOUS
  Administered 2018-11-08: 30 mg via INTRAVENOUS
  Administered 2018-11-08: 4 mg via INTRAVENOUS
  Administered 2018-11-08: 6.5 mg via INTRAVENOUS
  Administered 2018-11-08: 4 mg via INTRAVENOUS
  Administered 2018-11-08: 1.5 mg via INTRAVENOUS
  Filled 2018-11-04 (×5): qty 30

## 2018-11-04 MED ORDER — HYDROMORPHONE HCL 1 MG/ML IJ SOLN
1.0000 mg | Freq: Once | INTRAMUSCULAR | Status: AC
  Administered 2018-11-04: 04:00:00 1 mg via INTRAVENOUS
  Filled 2018-11-04: qty 1

## 2018-11-04 MED ORDER — ONDANSETRON HCL 4 MG/2ML IJ SOLN
4.0000 mg | INTRAMUSCULAR | Status: DC | PRN
  Administered 2018-11-04: 08:00:00 4 mg via INTRAVENOUS
  Filled 2018-11-04: qty 2

## 2018-11-04 MED ORDER — DEXTROSE-NACL 5-0.45 % IV SOLN
INTRAVENOUS | Status: DC
  Administered 2018-11-04: 01:00:00 via INTRAVENOUS

## 2018-11-04 MED ORDER — POLYETHYLENE GLYCOL 3350 17 G PO PACK
17.0000 g | PACK | Freq: Every day | ORAL | Status: DC | PRN
  Filled 2018-11-04: qty 1

## 2018-11-04 MED ORDER — SODIUM CHLORIDE 0.9 % IV SOLN
25.0000 mg | INTRAVENOUS | Status: DC | PRN
  Filled 2018-11-04: qty 0.5

## 2018-11-04 MED ORDER — ENOXAPARIN SODIUM 40 MG/0.4ML ~~LOC~~ SOLN
40.0000 mg | Freq: Every day | SUBCUTANEOUS | Status: DC
  Filled 2018-11-04 (×6): qty 0.4

## 2018-11-04 MED ORDER — VITAMIN D3 25 MCG (1000 UNIT) PO TABS
2000.0000 [IU] | ORAL_TABLET | Freq: Every day | ORAL | Status: DC
  Administered 2018-11-04 – 2018-11-11 (×8): 2000 [IU] via ORAL
  Filled 2018-11-04 (×8): qty 2

## 2018-11-04 MED ORDER — HYDROMORPHONE HCL 2 MG/ML IJ SOLN
2.0000 mg | INTRAMUSCULAR | Status: AC
  Administered 2018-11-04: 02:00:00 2 mg via INTRAVENOUS
  Filled 2018-11-04: qty 1

## 2018-11-04 MED ORDER — FOLIC ACID 1 MG PO TABS
1.0000 mg | ORAL_TABLET | Freq: Every day | ORAL | Status: DC
  Administered 2018-11-04 – 2018-11-11 (×8): 1 mg via ORAL
  Filled 2018-11-04 (×8): qty 1

## 2018-11-04 NOTE — Progress Notes (Signed)
Pt throughout this shift has removed nasal canula from nose and then becomes very irritated and agitated due to the beeping machine. RN has attempted multiple times throughout this shift to educate pt regarding PCA protocol. Pt becomes very irritated and argumentative states " I know(profanity used) don't keep telling me the same thing you just need to flow with things and leave me alone" pt has several episodes of yelling out and using profanity Nursing has tried to reassure pt but Pt becomes very agitated using profanity and arguing with staff

## 2018-11-04 NOTE — ED Notes (Signed)
Pt insisting nurse will allow him to swab himself for the COVID19 test, sts he had it done enough times to know how to do it. Explained to pt it is nasopharyngeal swab and nurse can't let him do it, pt starts moaning and crying  very loudly. He did however agreed to allow this nurse to perform the test.

## 2018-11-04 NOTE — Progress Notes (Signed)
PROGRESS NOTE    Timothy Marks  RAX:[id number] DOB: 1990/04/10 DOA: 11/03/2018 PCP: Lanae Boast, FNP    Brief Narrative:   Timothy Marks  is a 28 y.o. male, w sickle cell anemia,  osteonecrosis of the left shoulder, chronic pain syndrome, opiate dependence, oppositional defiance disorder ,  depression, vitamin D deficiency, who presents with c/o generalized pain per patient triggered by weather change. Pt states has pain in bilateral arms, left leg, back, chest. This is consistent with his sickle cell pain. Pt denies fever, chills, cough, palp, sob, n/v, abd pain, diarrhea, brbpr, dysuria, hematuria.   In ED, T 98.6, P 83  R 16, Bp 116/68  Pox 99% on RA, Wbc 13.3, Hgb 8.6, Plt 453, Na 138, K 5.0, Bun 7, Creatinine 0.42, AST 65, ALT 12, Alk phos 68, T. Bili 3.6. CXR pending. Pt received dilaudid 67m iv x 6 in ED, without relief. Pt will be admitted for sickle cell crisis.     Assessment & Plan:   Principal Problem:   Sickle cell crisis (HCC) Active Problems:   Hyperbilirubinemia   Thrombocythemia (HCC)   Abnormal liver function    Acute sickle cell pain crisis Patient presenting with intractable back, chest, leg pain consistent with his normal pattern of sickle cell pain crisis.  He also has associated nausea.  Patient was recently admitted and discharged by this sickle cell team on 11/02/2018 for similar episode.  Patient states he is compliant with his home medication regimen to include pain medication, folic acid,  And hydroxyurea.  Patient states he tries to maintain his hydration. --chest x-ray with no active disease process -- CBC with differential notable for sickle cells and target cells --retic count elevated at 13.9 --Continue home hydroxyurea 5060mPO BID;  Given his recurrence of SS crisis, may consider increasing  His hydroxyurea closer to 3586mg; but will defer to his sickle cell team --continue folic acid --Dilaudid PCA --D5 half-normal saline at 125 mL/hour  --supportive care with Zofran and Benadryl prn --repeat retic count in a.m.   chronic pain syndrome --continue gabapentin   transaminitis  patient with noted elevated transaminases with bilirubin of 3.6 on admission.  Currently trending down. --AST 65-->38 --ALT 12-->15 --acute hepatitis panel pending --will hold off right upper quadrant ultrasound unless  Liver function worsens --Follow CMP daily  Hx Vitamin D  Deficiency -- continue home vitamin-D supplementation  DVT prophylaxis:  Lovenox, SCDs Code Status:  Full code Family Communication: none Disposition Plan:  Continue observation status,  Likely plan to hand over to sickle cell team tomorrow for further treatment and recommendations   Consultants:   none  Procedures:   none  Antimicrobials:   none   Subjective: Patient seen examined at bedside, complaining of diffuse pain to his chest, legs, back.  Also reports associated nausea.  Patient also concerned about abnormal curvature of his spine, which appears to be normal in appearance on exam today.  Was recently admitted and discharged by the sickle cell team on 11/02/2018.  States he is compliant with his home medicines.  No other complaints or concerns at this time.  Denies headache, no fever/ chills/ night sweats, no vomiting /diarrhea, no shortness of breath, no palpitations, no issues with bowel / bladder function, no paresthesias.  No acute events overnight per nurse staff.  Objective: Vitals:   11/04/18 0750 11/04/18 0810 11/04/18 1137 11/04/18 1212  BP:  127/70  137/69  Pulse:  84  76  Resp: _0 16  Temp:  98.5 F (36.9 C)  98.9 F (37.2 C)  TempSrc:  Oral  Oral  SpO2: 95% 95% 94% 95%  Weight:      Height:        Intake/Output Summary (Last 24 hours) at 11/04/2018 1227 Last data filed at 11/04/2018 0600 Gross per 24 hour  Intake 701.96 ml  Output -  Net 701.96 ml   Filed Weights   11/03/18 2218  Weight: 61.2 kg    Examination:   General exam: Appears calm and comfortable,  Thin in appearance  Respiratory system: Clear to auscultation. Respiratory effort normal,   Oxygenating well on room air Cardiovascular system: S1 & S2 heard, RRR. No JVD, murmurs, rubs, gallops or clicks. No pedal edema. Gastrointestinal system: Abdomen is nondistended, soft and nontender. No organomegaly or masses felt. Normal bowel sounds heard. Central nervous system: Alert and oriented. No focal neurological deficits. Extremities: Symmetric 5 x 5 power. Skin: No rashes, lesions or ulcers Psychiatry: Judgement and insight appear normal. Mood & affect appropriate.     Data Reviewed: I have personally reviewed following labs and imaging studies  CBC: Recent Labs  Lab 10/29/18 0430 11/02/18 1055 11/03/18 2225 11/04/18 0523  WBC 14.0* 7.7 13.3* 16.9*  NEUTROABS 9.1* 4.3 6.6  --   HGB 10.0* 8.9* 8.6* 8.5*  HCT 29.2* 26.4* 25.5* 25.2*  MCV 91.5 91.0 92.4 92.6  PLT 513* 491* 453* 979*   Basic Metabolic Panel: Recent Labs  Lab 10/29/18 0430 11/03/18 2225 11/04/18 0523  NA 140 138 139  K 4.5 5.0 4.0  CL 102 104 106  CO2 _0 GLUCOSE 91 91 87  BUN _1 CREATININE 0.54* 0.42* 0.50*  CALCIUM 9.6 8.9 9.0   GFR: Estimated Creatinine Clearance: 116.9 mL/min (A) (by C-G formula based on SCr of 0.5 mg/dL (L)). Liver Function Tests: Recent Labs  Lab 11/03/18 2225 11/04/18 0523  AST 65* 38  ALT 12 15  ALKPHOS 68 68  BILITOT 3.6* 3.7*  PROT 7.3 7.4  ALBUMIN 4.3 4.3   No results for input(s): LIPASE, AMYLASE in the last 168 hours. No results for input(s): AMMONIA in the last 168 hours. Coagulation Profile: No results for input(s): INR, PROTIME in the last 168 hours. Cardiac Enzymes: No results for input(s): CKTOTAL, CKMB, CKMBINDEX, TROPONINI in the last 168 hours. BNP (last 3 results) No results for input(s): PROBNP in the last 8760 hours. HbA1C: No results for input(s): HGBA1C in the last 72 hours. CBG: No  results for input(s): GLUCAP in the last 168 hours. Lipid Profile: No results for input(s): CHOL, HDL, LDLCALC, TRIG, CHOLHDL, LDLDIRECT in the last 72 hours. Thyroid Function Tests: No results for input(s): TSH, T4TOTAL, FREET4, T3FREE, THYROIDAB in the last 72 hours. Anemia Panel: Recent Labs    11/02/18 1055 11/03/18 2225  RETICCTPCT 12.7* 13.9*   Sepsis Labs: No results for input(s): PROCALCITON, LATICACIDVEN in the last 168 hours.  No results found for this or any previous visit (from the past 240 hour(s)).       Radiology Studies: Dg Chest Port 1 View  Result Date: 11/04/2018 CLINICAL DATA:  Sickle cell crisis EXAM: PORTABLE CHEST 1 VIEW COMPARISON:  October 11, 2018 FINDINGS: The heart size and mediastinal contours are within normal limits. Both lungs are clear. The visualized skeletal structures are unremarkable. IMPRESSION: No active disease. Electronically Signed   By: Constance Holster M.D.   On: 11/04/2018 03:59        Scheduled  Meds: . cholecalciferol  2,000 Units Oral Daily  . enoxaparin (LOVENOX) injection  40 mg Subcutaneous Daily  . folic acid  1 mg Oral Daily  . gabapentin  300 mg Oral TID  . HYDROmorphone   Intravenous Q4H  . hydroxyurea  500 mg Oral BID  . senna-docusate  1 tablet Oral BID   Continuous Infusions: . dextrose 5 % and 0.45% NaCl 125 mL/hr at 11/04/18 1022  . diphenhydrAMINE       LOS: 0 days    Time spent: 36 minutes spent on chart review, discussion with nursing staff, consultants, updating family and interview/physical exam; more than 50% of that time was spent in counseling and/or coordination of care.     Jastin Fore J [place] DO [facility] Pager [phone]  If 7PM-7AM, please contact night-coverage [email or web address] Password [facility] 11/04/2018, 12:27 PM

## 2018-11-04 NOTE — H&P (Addendum)
TRH H&P    Patient Demographics:    Timothy Marks, is a 28 y.o. male  MRN: [id number]  DOB - 19-Feb-1990  Admit Date - 11/03/2018  Referring MD/NP/PA: Erasmo Downer Ward  Outpatient Primary MD for the patient is Lanae Boast, [place]  Patient coming from:  home  Chief complaint- sickle cell crisis   HPI:    Timothy Marks  is a 28 y.o. male, w sickle cell anemia,  osteonecrosis of the left shoulder, chronic pain syndrome, opiate dependence, oppositional defiance disorder ,  depression, vitamin D deficiency, who presents with c/o generalized pain per patient triggered by weather change. Pt states has pain in bilateral arms, left leg, back, chest. This is consistent with his sickle cell pain. Pt denies fever, chills, cough, palp, sob, n/v, abd pain, diarrhea, brbpr, dysuria, hematuria.   In ED T 98.6, P 83  R 16, Bp 116/68  Pox 99% on RA Wt 61.2kg    Wbc 13.3, Hgb 8.6, Plt 453 Na 138, K 5.0, Bun 7, Creatinine 0.42 Ast 65, Alt 12, Alk phos 68, T. Bili 3.6  CXR pending  Pt received dilaudid 14m iv x 6 in ED, without relief.   Pt will be admitted for sickle cell crisis.           Review of systems:    In addition to the HPI above,  No Fever-chills, No Headache, No changes with Vision or hearing, No problems swallowing food or Liquids, No Cough or Shortness of Breath, No Abdominal pain, No Nausea or Vomiting, bowel movements are regular, No Blood in stool or Urine, No dysuria, No new skin rashes or bruises,   No new weakness, tingling, numbness in any extremity, No recent weight gain or loss, No polyuria, polydypsia or polyphagia, No significant Mental Stressors.  All other systems reviewed and are negative.    Past History of the following :    Past Medical History:  Diagnosis Date  . Depression   . 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 Sickle cell anemia (HCC)   . Vitamin D deficiency y-6      Past Surgical History:  Procedure Laterality Date  . CHOLECYSTECTOMY        Social History:      Social History   Tobacco Use  . Smoking status: Never Smoker  . Smokeless tobacco: Never Used  Substance Use Topics  . Alcohol use: No       Family History :     Family History  Problem Relation Age of Onset  . Sickle cell trait Mother   . Sickle cell trait Father        Home Medications:   Prior to Admission medications   Medication Sig Start Date End Date Taking? Authorizing Provider  cholecalciferol (VITAMIN D) 1000 units tablet Take 2 tablets (2,000 Units total) by mouth daily. 12/13/17  Yes SAzzie Glatter FNP  clindamycin (CLEOCIN) 300 MG capsule Take 1 capsule (300 mg total) by mouth 4 (four)  times daily. X 7 days 10/29/18  Yes Delo, Nathaneil Canary, MD  folic acid (FOLVITE) 1 MG tablet Take 1 tablet (1 mg total) by mouth daily. 05/01/17  Yes Dorena Dew, FNP  gabapentin (NEURONTIN) 300 MG capsule TAKE 1 CAPSULE(300 MG) BY MOUTH THREE TIMES DAILY Patient taking differently: Take 300 mg by mouth 3 (three) times daily.  05/08/18  Yes Lanae Boast, FNP  hydroxyurea (HYDREA) 500 MG capsule Take 1 capsule (500 mg total) by mouth 2 (two) times daily. May take with food to minimize GI side effects. 05/01/17  Yes Dorena Dew, FNP  oxyCODONE (OXYCONTIN) 20 mg 12 hr tablet Take 1 tablet (20 mg total) by mouth every 12 (twelve) hours. 10/24/18 11/25/18 Yes Lanae Boast, FNP  oxyCODONE-acetaminophen (PERCOCET) 10-325 MG tablet Take 1 tablet by mouth every 6 (six) hours as needed for up to 15 days for pain. 10/24/18 11/08/18 Yes Lanae Boast, FNP  aspirin EC 81 MG tablet Take 1 tablet (81 mg total) by mouth daily. Patient not taking: Reported on 10/11/2018 06/02/17   Dorena Dew, FNP  ibuprofen (ADVIL,MOTRIN) 800 MG tablet Take 1 tablet (800 mg total) by mouth 3 (three) times daily as needed (pain).  12/20/17   Azzie Glatter, FNP     Allergies:     Allergies  Allergen Reactions  . Buprenorphine Hcl Hives  . Levaquin [Levofloxacin] Itching  . Meperidine Rash  . Morphine And Related Hives  . Toradol [Ketorolac Tromethamine] Itching  . Tramadol Hives  . Vancomycin Itching  . Zosyn [Piperacillin Sod-Tazobactam So] Itching and Rash    Has taken rocephin in past     Physical Exam:   Vitals  Blood pressure (!) 110/52, pulse 90, temperature 98.6 F (37 C), temperature source Oral, resp. rate 16, height _0  (1.88 m), weight 61.2 kg, SpO2 98 %.  1.  General: axoxo3  2. Psychiatric: euthymic  3. Neurologic: cn2-12 intact, reflexes 2+ symmetric, diffuse with no clonus, motor 5/5 in all 4 ext, pinprik intact  4. HEENMT:  Anicteric, pupils 1.35m symmetric, direct, consensual, near intact Neck: no jvd, no bruit  5. Respiratory : CTAB  6. Cardiovascular : rrr s1, s2, no m/g/r, chest pain w palpation  7. Gastrointestinal:  Abd: soft, nt, nd, +bs  8. Skin:  Ext: no c/c/e, no rash  9.Musculoskeletal:  Good ROM,     Data Review:    CBC Recent Labs  Lab 10/29/18 0430 11/02/18 1055 11/03/18 2225  WBC 14.0* 7.7 13.3*  HGB 10.0* 8.9* 8.6*  HCT 29.2* 26.4* 25.5*  PLT 513* 491* 453*  MCV 91.5 91.0 92.4  MCH 31.3 30.7 31.2  MCHC 34.2 33.7 33.7  RDW 17.3* 18.2* 19.2*  LYMPHSABS 3.2 2.1 4.6*  MONOABS 1.1* 0.8 1.4*  EOSABS 0.5 0.3 0.5  BASOSABS 0.0 0.0 0.1   ------------------------------------------------------------------------------------------------------------------  Results for orders placed or performed during the hospital encounter of 11/03/18 (from the past 48 hour(s))  Comprehensive metabolic panel     Status: Abnormal   Collection Time: 11/03/18 10:25 PM  Result Value Ref Range   Sodium 138 135 - 145 mmol/L   Potassium 5.0 3.5 - 5.1 mmol/L   Chloride 104 98 - 111 mmol/L   CO2 25 22 - 32 mmol/L   Glucose, Bld 91 70 - 99 mg/dL   BUN 7 6 - 20  mg/dL   Creatinine, Ser 0.42 (L) 0.61 - 1.24 mg/dL   Calcium 8.9 8.9 - 10.3 mg/dL   Total Protein 7.3  6.5 - 8.1 g/dL   Albumin 4.3 3.5 - 5.0 g/dL   AST 65 (H) 15 - 41 U/L   ALT 12 0 - 44 U/L   Alkaline Phosphatase 68 38 - 126 U/L   Total Bilirubin 3.6 (H) 0.3 - 1.2 mg/dL   GFR calc non Af Amer >60 >60 mL/min   GFR calc Af Amer >60 >60 mL/min   Anion gap 9 5 - 15    Comment: Performed at [facility], [place] [address] [place], [place] [address]  CBC with Differential     Status: Abnormal   Collection Time: 11/03/18 10:25 PM  Result Value Ref Range   WBC 13.3 (H) 4.0 - 10.5 K/uL   RBC 2.76 (L) 4.22 - 5.81 MIL/uL   Hemoglobin 8.6 (L) 13.0 - 17.0 g/dL   HCT 25.5 (L) 39.0 - 52.0 %   MCV 92.4 80.0 - 100.0 fL   MCH 31.2 26.0 - 34.0 pg   MCHC 33.7 30.0 - 36.0 g/dL   RDW 19.2 (H) 11.5 - 15.5 %   Platelets 453 (H) 150 - 400 K/uL   nRBC 6.0 (H) 0.0 - 0.2 %   Neutrophils Relative % 49 %   Neutro Abs 6.6 1.7 - 7.7 K/uL   Lymphocytes Relative 35 %   Lymphs Abs 4.6 (H) 0.7 - 4.0 K/uL   Monocytes Relative 10 %   Monocytes Absolute 1.4 (H) 0.1 - 1.0 K/uL   Eosinophils Relative 4 %   Eosinophils Absolute 0.5 0.0 - 0.5 K/uL   Basophils Relative 0 %   Basophils Absolute 0.1 0.0 - 0.1 K/uL   Immature Granulocytes 2 %   Abs Immature Granulocytes 0.21 (H) 0.00 - 0.07 K/uL   [name] Bodies PRESENT    Polychromasia PRESENT    Sickle Cells PRESENT    Target Cells PRESENT     Comment: Performed at [facility], [place] [address], [place],  [address]  Reticulocytes     Status: Abnormal   Collection Time: 11/03/18 10:25 PM  Result Value Ref Range   Retic Ct Pct 13.9 (H) 0.4 - 3.1 %    Comment: RESULTS CONFIRMED BY MANUAL DILUTION   RBC. 2.76 (L) 4.22 - 5.81 MIL/uL   Retic Count, Absolute 404.5 (H) 19.0 - 186.0 K/uL   Immature Retic Fract 44.6 (H) 2.3 - 15.9 %    Comment: Performed at [facility], [place] [address] [place],  [place] [address]    Chemistries  Recent Labs  Lab 10/29/18 0430 11/03/18 2225  NA 140 138  K 4.5 5.0  CL 102 104  CO2 27 25  GLUCOSE 91 91  BUN 9 7  CREATININE 0.54* 0.42*  CALCIUM 9.6 8.9  AST  --  65*  ALT  --  12  ALKPHOS  --  68  BILITOT  --  3.6*   ------------------------------------------------------------------------------------------------------------------  ------------------------------------------------------------------------------------------------------------------ GFR: Estimated Creatinine Clearance: 116.9 mL/min (A) (by C-G formula based on SCr of 0.42 mg/dL (L)). Liver Function Tests: Recent Labs  Lab 11/03/18 2225  AST 65*  ALT 12  ALKPHOS 68  BILITOT 3.6*  PROT 7.3  ALBUMIN 4.3   No results for input(s): LIPASE, AMYLASE in the last 168 hours. No results for input(s): AMMONIA in the last 168 hours. Coagulation Profile: No results for input(s): INR, PROTIME in the last 168 hours. Cardiac Enzymes: No results for input(s): CKTOTAL, CKMB, CKMBINDEX, TROPONINI in the last 168 hours. BNP (last 3 results) No  results for input(s): PROBNP in the last 8760 hours. HbA1C: No results for input(s): HGBA1C in the last 72 hours. CBG: No results for input(s): GLUCAP in the last 168 hours. Lipid Profile: No results for input(s): CHOL, HDL, LDLCALC, TRIG, CHOLHDL, LDLDIRECT in the last 72 hours. Thyroid Function Tests: No results for input(s): TSH, T4TOTAL, FREET4, T3FREE, THYROIDAB in the last 72 hours. Anemia Panel: Recent Labs    11/02/18 1055 11/03/18 2225  RETICCTPCT 12.7* 13.9*    --------------------------------------------------------------------------------------------------------------- Urine analysis:    Component Value Date/Time   COLORURINE YELLOW 07/26/2018 1721   APPEARANCEUR CLOUDY (A) 07/26/2018 1721   LABSPEC 1.005 07/26/2018 1721   PHURINE 6.0 07/26/2018 1721   GLUCOSEU NEGATIVE 07/26/2018 1721   HGBUR SMALL (A) 07/26/2018 1721    BILIRUBINUR small 10/12/2018 0920   KETONESUR NEGATIVE 07/26/2018 1721   PROTEINUR Positive (A) 10/12/2018 0920   PROTEINUR NEGATIVE 07/26/2018 1721   UROBILINOGEN 4.0 (A) 10/12/2018 0920   UROBILINOGEN 2.0 (H) 05/01/2017 0955   NITRITE neg 10/12/2018 0920   NITRITE NEGATIVE 07/26/2018 1721   LEUKOCYTESUR Negative 10/12/2018 0920   LEUKOCYTESUR LARGE (A) 07/26/2018 1721      Imaging Results:    No results found.     Assessment & Plan:    Principal Problem:   Sickle cell crisis (HCC) Active Problems:   Hyperbilirubinemia   Thrombocythemia (HCC)   Abnormal liver function  Sickle cell crisis Check CXR O2 [place] 1/2 ns at 154m per hour Dilaudid pca Zofran 485miv q4h prn  Benadryl 2513mv q4h prn   Sickle cell anemia Cont Folic acid Cont Hydroxyurea  Chronic pain syndrome Cont Gabapentin  Abnormal liver function  Check acute hepatitis panel Check cmp in am  H/o Vitamin D def Cont VItamin D   DVT Prophylaxis-   Lovenox - SCDs   AM Labs Ordered, also please review Full Orders  Family Communication: Admission, patients condition and plan of care including tests being ordered have been discussed with the patient  who indicate understanding and agree with the plan and Code Status.  Code Status:  FULL CODE per patient,  Admission status: Observation: Based on patients clinical presentation and evaluation of above clinical data, I have made determination that patient meets observation criteria at this time. Pt will require admission for refractory pain, depending upon how he responds to treatment, may require change to inpatient status  Time spent in minutes : 55 minutes   JamJani GravelD on 11/04/2018 at 2:42 AM

## 2018-11-04 NOTE — ED Provider Notes (Addendum)
TIME SEEN: 12:20 AM  CHIEF COMPLAINT: Sickle cell pain crisis  HPI: Patient is a 28 year old male with history of sickle cell anemia who presents to the emergency department with complaints of a sickle cell pain crisis for the past couple of days.  Taking home medications without any relief.  No chest pain, shortness of breath, fevers, cough, vomiting, diarrhea.  ROS: See HPI Constitutional: no fever  Eyes: no drainage  ENT: no runny nose   Cardiovascular:  no chest pain  Resp: no SOB  GI: no vomiting GU: no dysuria Integumentary: no rash  Allergy: no hives  Musculoskeletal: no leg swelling  Neurological: no slurred speech ROS otherwise negative  PAST MEDICAL HISTORY/PAST SURGICAL HISTORY:  Past Medical History:  Diagnosis Date  . Depression   . 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 Sickle cell anemia (HCC)   . Vitamin D deficiency y-6    MEDICATIONS:  Prior to Admission medications   Medication Sig Start Date End Date Taking? Authorizing Provider  aspirin EC 81 MG tablet Take 1 tablet (81 mg total) by mouth daily. Patient not taking: Reported on 10/11/2018 06/02/17   Dorena Dew, FNP  cholecalciferol (VITAMIN D) 1000 units tablet Take 2 tablets (2,000 Units total) by mouth daily. 12/13/17   Azzie Glatter, FNP  clindamycin (CLEOCIN) 300 MG capsule Take 1 capsule (300 mg total) by mouth 4 (four) times daily. X 7 days 10/29/18   Veryl Speak, MD  folic acid (FOLVITE) 1 MG tablet Take 1 tablet (1 mg total) by mouth daily. 05/01/17   Dorena Dew, FNP  gabapentin (NEURONTIN) 300 MG capsule TAKE 1 CAPSULE(300 MG) BY MOUTH THREE TIMES DAILY Patient taking differently: Take 300 mg by mouth 3 (three) times daily.  05/08/18   Lanae Boast, FNP  hydroxyurea (HYDREA) 500 MG capsule Take 1 capsule (500 mg total) by mouth 2 (two) times daily. May take with food to minimize GI side effects. 05/01/17   Dorena Dew, FNP  ibuprofen (ADVIL,MOTRIN) 800 MG tablet Take 1 tablet (800 mg total) by mouth 3 (three) times daily as needed (pain). 12/20/17   Azzie Glatter, FNP  oxyCODONE (OXYCONTIN) 20 mg 12 hr tablet Take 1 tablet (20 mg total) by mouth every 12 (twelve) hours. 10/24/18 11/25/18  Lanae Boast, FNP  oxyCODONE-acetaminophen (PERCOCET) 10-325 MG tablet Take 1 tablet by mouth every 6 (six) hours as needed for up to 15 days for pain. 10/24/18 11/08/18  Lanae Boast, FNP    ALLERGIES:  Allergies  Allergen Reactions  . Buprenorphine Hcl Hives  . Levaquin [Levofloxacin] Itching  . Meperidine Rash  . Morphine And Related Hives  . Toradol [Ketorolac Tromethamine] Itching  . Tramadol Hives  . Vancomycin Itching  . Zosyn [Piperacillin Sod-Tazobactam So] Itching and Rash    Has taken rocephin in past    SOCIAL HISTORY:  Social History   Tobacco Use  . Smoking status: Never Smoker  . Smokeless tobacco: Never Used  Substance Use Topics  . Alcohol use: No    FAMILY HISTORY: Family History  Problem Relation Age of Onset  . Sickle cell trait Mother   . Sickle cell trait Father     EXAM: BP 110/71   Pulse 80   Temp 98.6 F (37 C) (Oral)   Resp (!) 21   Ht 6\' 2"  (1.88 m)   Wt 61.2 kg   SpO2 100%   BMI 17.33  kg/m  CONSTITUTIONAL: Alert and oriented and responds appropriately to questions.  Chronically ill-appearing.  Yelling, moaning during exam.  Refuses to roll onto his back for examination secondary to pain. HEAD: Normocephalic EYES: Conjunctivae clear, pupils appear equal, EOMI ENT: normal nose; moist mucous membranes NECK: Supple, no meningismus, no nuchal rigidity, no LAD  CARD: RRR; S1 and S2 appreciated; no murmurs, no clicks, no rubs, no gallops RESP: Normal chest excursion without splinting or tachypnea; breath sounds clear and equal bilaterally; no wheezes, no rhonchi, no rales, no hypoxia or respiratory distress, speaking full sentences ABD/GI: Normal bowel sounds;  non-distended; soft, diffusely tender to palpation, no rebound, no guarding, no peritoneal signs, no hepatosplenomegaly BACK:  The back appears normal and is under to palpation diffusely EXT: Normal ROM in all joints; tender to palpation diffusely; no edema; normal capillary refill; no cyanosis, no calf tenderness or swelling    SKIN: Normal color for age and race; warm; no rash NEURO: Moves all extremities equally PSYCH: The patient's mood and manner are appropriate. Grooming and personal hygiene are appropriate.  MEDICAL DECISION MAKING: Patient here with complaints of sickle cell pain crisis.  Will give IV fluids, pain and nausea medicine.  Labs show hemoglobin of 8.6 which appears to be near baseline.  He states last transfusion was several months ago.  No chest pain or shortness of breath.  No fevers or cough.  No vomiting.  ED PROGRESS: Patient reports no improvement after 3 rounds of pain medication.  Continues to moan and cry out in pain.  Requesting admission.  Will discuss with hospitalist.  2:40 AM Discussed patient's case with hospitalist, Dr. Maudie Mercury.  I have recommended admission and patient (and family if present) agree with this plan. Admitting physician will place admission orders.   I reviewed all nursing notes, vitals, pertinent previous records, EKGs, lab and urine results, imaging (as available).      Timothy Marks was evaluated in Emergency Department on 11/04/2018 for the symptoms described in the history of present illness. He was evaluated in the context of the global COVID-19 pandemic, which necessitated consideration that the patient might be at risk for infection with the SARS-CoV-2 virus that causes COVID-19. Institutional protocols and algorithms that pertain to the evaluation of patients at risk for COVID-19 are in a state of rapid change based on information released by regulatory bodies including the CDC and federal and state organizations. These policies and algorithms  were followed during the patient's care in the ED.  Ward, Delice Bison, DO 11/04/18 0240    Ward, Delice Bison, DO 11/04/18 [id number]

## 2018-11-05 DIAGNOSIS — Z881 Allergy status to other antibiotic agents status: Secondary | ICD-10-CM | POA: Diagnosis not present

## 2018-11-05 DIAGNOSIS — R630 Anorexia: Secondary | ICD-10-CM | POA: Diagnosis present

## 2018-11-05 DIAGNOSIS — R740 Nonspecific elevation of levels of transaminase and lactic acid dehydrogenase [LDH]: Secondary | ICD-10-CM | POA: Diagnosis present

## 2018-11-05 DIAGNOSIS — F913 Oppositional defiant disorder: Secondary | ICD-10-CM | POA: Diagnosis present

## 2018-11-05 DIAGNOSIS — M879 Osteonecrosis, unspecified: Secondary | ICD-10-CM | POA: Diagnosis present

## 2018-11-05 DIAGNOSIS — F1120 Opioid dependence, uncomplicated: Secondary | ICD-10-CM | POA: Diagnosis present

## 2018-11-05 DIAGNOSIS — Z885 Allergy status to narcotic agent status: Secondary | ICD-10-CM | POA: Diagnosis not present

## 2018-11-05 DIAGNOSIS — D5700 Hb-SS disease with crisis, unspecified: Secondary | ICD-10-CM | POA: Diagnosis present

## 2018-11-05 DIAGNOSIS — Z7982 Long term (current) use of aspirin: Secondary | ICD-10-CM | POA: Diagnosis not present

## 2018-11-05 DIAGNOSIS — Z832 Family history of diseases of the blood and blood-forming organs and certain disorders involving the immune mechanism: Secondary | ICD-10-CM | POA: Diagnosis not present

## 2018-11-05 DIAGNOSIS — Z888 Allergy status to other drugs, medicaments and biological substances status: Secondary | ICD-10-CM | POA: Diagnosis not present

## 2018-11-05 DIAGNOSIS — F418 Other specified anxiety disorders: Secondary | ICD-10-CM | POA: Diagnosis present

## 2018-11-05 DIAGNOSIS — D72829 Elevated white blood cell count, unspecified: Secondary | ICD-10-CM | POA: Diagnosis not present

## 2018-11-05 DIAGNOSIS — Z681 Body mass index (BMI) 19 or less, adult: Secondary | ICD-10-CM | POA: Diagnosis not present

## 2018-11-05 DIAGNOSIS — E559 Vitamin D deficiency, unspecified: Secondary | ICD-10-CM | POA: Diagnosis present

## 2018-11-05 DIAGNOSIS — D473 Essential (hemorrhagic) thrombocythemia: Secondary | ICD-10-CM | POA: Diagnosis present

## 2018-11-05 DIAGNOSIS — G894 Chronic pain syndrome: Secondary | ICD-10-CM | POA: Diagnosis present

## 2018-11-05 DIAGNOSIS — D638 Anemia in other chronic diseases classified elsewhere: Secondary | ICD-10-CM | POA: Diagnosis present

## 2018-11-05 DIAGNOSIS — Z79899 Other long term (current) drug therapy: Secondary | ICD-10-CM | POA: Diagnosis not present

## 2018-11-05 DIAGNOSIS — Z20828 Contact with and (suspected) exposure to other viral communicable diseases: Secondary | ICD-10-CM | POA: Diagnosis present

## 2018-11-05 DIAGNOSIS — R945 Abnormal results of liver function studies: Secondary | ICD-10-CM | POA: Diagnosis present

## 2018-11-05 LAB — COMPREHENSIVE METABOLIC PANEL
ALT: 12 U/L (ref 0–44)
AST: 31 U/L (ref 15–41)
Albumin: 4.2 g/dL (ref 3.5–5.0)
Alkaline Phosphatase: 63 U/L (ref 38–126)
Anion gap: 10 (ref 5–15)
BUN: 11 mg/dL (ref 6–20)
CO2: 26 mmol/L (ref 22–32)
Calcium: 9 mg/dL (ref 8.9–10.3)
Chloride: 102 mmol/L (ref 98–111)
Creatinine, Ser: 0.45 mg/dL — ABNORMAL LOW (ref 0.61–1.24)
GFR calc Af Amer: >60 mL/min (ref >60)
GFR calc non Af Amer: >60 mL/min (ref >60)
Glucose, Bld: 102 mg/dL — ABNORMAL HIGH (ref 70–99)
Potassium: 3.9 mmol/L (ref 3.5–5.1)
Sodium: 138 mmol/L (ref 135–145)
Total Bilirubin: 4.7 mg/dL — ABNORMAL HIGH (ref 0.3–1.2)
Total Protein: 7.2 g/dL (ref 6.5–8.1)

## 2018-11-05 LAB — RETICULOCYTES
Immature Retic Fract: 37.7 % — ABNORMAL HIGH (ref 2.3–15.9)
RBC.: 2.37 MIL/uL — ABNORMAL LOW (ref 4.22–5.81)
Retic Count, Absolute: 398.5 10*3/uL — ABNORMAL HIGH (ref 19.0–186.0)
Retic Ct Pct: 16.3 % — ABNORMAL HIGH (ref 0.4–3.1)

## 2018-11-05 LAB — CBC
HCT: 21.9 % — ABNORMAL LOW (ref 39.0–52.0)
Hemoglobin: 7.4 g/dL — ABNORMAL LOW (ref 13.0–17.0)
MCH: 31.2 pg (ref 26.0–34.0)
MCHC: 33.8 g/dL (ref 30.0–36.0)
MCV: 92.4 fL (ref 80.0–100.0)
Platelets: 414 10*3/uL — ABNORMAL HIGH (ref 150–400)
RBC: 2.37 MIL/uL — ABNORMAL LOW (ref 4.22–5.81)
RDW: 20.2 % — ABNORMAL HIGH (ref 11.5–15.5)
WBC: 14.4 10*3/uL — ABNORMAL HIGH (ref 4.0–10.5)
nRBC: 5.2 % — ABNORMAL HIGH (ref 0.0–0.2)

## 2018-11-05 LAB — HEPATITIS PANEL, ACUTE
HCV Ab: 0.1 s/co ratio (ref 0.0–0.9)
Hep A IgM: NEGATIVE
Hep B C IgM: NEGATIVE
Hepatitis B Surface Ag: NEGATIVE

## 2018-11-05 MED ORDER — ADULT MULTIVITAMIN W/MINERALS CH
1.0000 | ORAL_TABLET | Freq: Every day | ORAL | Status: DC
  Administered 2018-11-05 – 2018-11-11 (×7): 1 via ORAL
  Filled 2018-11-05 (×8): qty 1

## 2018-11-05 MED ORDER — ENSURE ENLIVE PO LIQD
237.0000 mL | Freq: Three times a day (TID) | ORAL | Status: DC
  Administered 2018-11-05 – 2018-11-11 (×14): 237 mL via ORAL

## 2018-11-05 MED ORDER — LORAZEPAM 2 MG/ML IJ SOLN
1.0000 mg | Freq: Once | INTRAMUSCULAR | Status: AC
  Administered 2018-11-05: 14:00:00 1 mg via INTRAVENOUS
  Filled 2018-11-05: qty 1

## 2018-11-05 MED ORDER — PRO-STAT SUGAR FREE PO LIQD
30.0000 mL | Freq: Two times a day (BID) | ORAL | Status: DC
  Administered 2018-11-05 – 2018-11-08 (×2): 30 mL via ORAL
  Filled 2018-11-05 (×4): qty 30

## 2018-11-05 MED ORDER — HALOPERIDOL 0.5 MG PO TABS
1.0000 mg | ORAL_TABLET | Freq: Four times a day (QID) | ORAL | Status: DC | PRN
  Filled 2018-11-05 (×2): qty 1

## 2018-11-05 MED ORDER — OXYCODONE HCL ER 20 MG PO T12A
20.0000 mg | EXTENDED_RELEASE_TABLET | Freq: Two times a day (BID) | ORAL | Status: DC
  Administered 2018-11-05 – 2018-11-11 (×13): 20 mg via ORAL
  Filled 2018-11-05 (×13): qty 1

## 2018-11-05 MED ORDER — LORAZEPAM 2 MG/ML IJ SOLN
INTRAMUSCULAR | Status: AC
  Filled 2018-11-05: qty 1

## 2018-11-05 NOTE — Progress Notes (Addendum)
Initial Nutrition Assessment  RD working remotely.   DOCUMENTATION CODES:   Underweight  INTERVENTION:  - will order Ensure Enlive TID, each supplement provides 350 kcal and 20 grams of protein. - will order 30 mL Prostat BID, each supplement provides 100 kcal and 15 grams of protein. - will order daily multivitamin with minerals. - continue to encourage PO intakes.    NUTRITION DIAGNOSIS:   Inadequate oral intake related to acute illness as evidenced by per patient/family report, meal completion < 50%.  GOAL:   Patient will meet greater than or equal to 90% of their needs  MONITOR:   PO intake, Supplement acceptance, Labs, Weight trends  REASON FOR ASSESSMENT:   Malnutrition Screening Tool  ASSESSMENT:   28 y.o. male with medical history of sickle cell anemia, osteonecrosis of the left shoulder, chronic pain syndrome, opiate dependence, oppositional defiance disorder, depression, and vitamin D deficiency. He presented to the ED on 9/18 with complaint of generalized pain being triggered by weather change. He reported that pain is in bilateral arms, LLE, back, and chest and it is consistent with his typical sickle cell pain. Patient was admitted for sickle cell crisis.  Unable to reach patient by phone. Per flow sheet, patient consumed 25% of lunch yesterday (159 kcal, 8 grams protein) and 0% of breakfast today; no other meals documented. Per MST review, patient indicating no changes in appetite but indicated that he has unintentionally lost weight recently. Per chart review, current weight is 135 lb and weight has been fairly stable over the past 4 months. Suspect patient meets some degree of malnutrition based on BMI of 17.   Per notes: - patient has been reporting pain of 10/10 intensity in back, chest, and all extremities - acute sickle cell pain crisis - chronic pain syndrome - transaminitis - history of vitamin D deficiency   Labs reviewed; creatinine: 0.45  mg/dl. Medications reviewed; 2000 units vitamin D/day, 1 mg oral folvite/day, 1 tablet senokot BID.  IVF; D5-1/2 NS @ 75 ml/hr (306 kcal).     NUTRITION - FOCUSED PHYSICAL EXAM:  unable to complete at this time.   Diet Order:   Diet Order            Diet regular Room service appropriate? Yes  Diet effective now              EDUCATION NEEDS:   No education needs have been identified at this time  Skin:  Skin Assessment: Reviewed RN Assessment  Last BM:  9/19  Height:   Ht Readings from Last 1 Encounters:  11/03/18 6\' 2"  (1.88 m)    Weight:   Wt Readings from Last 1 Encounters:  11/03/18 61.2 kg    Ideal Body Weight:  86.4 kg  BMI:  Body mass index is 17.33 kg/m.  Estimated Nutritional Needs:   Kcal:  2100-2300 kcal  Protein:  100-110 grams  Fluid:  >/= 2 L/day      Jarome Matin, MS, RD, LDN, [facility] Inpatient Clinical Dietitian Pager # [phone] After hours/weekend pager # [phone]

## 2018-11-05 NOTE — Progress Notes (Signed)
Timothy Marks, a 28 year old male with a medical history significant for sickle cell disease, chronic pain syndrome, opiate dependence and tolerance, history of oppositional defiance disorder, and anemia of chronic disease was admitted overnight in sickle cell pain crisis.  Agree with current plan.  Patient writhing in pain on arrival.  He states the pain intensity is 10/10 primarily to upper back, mid chest, upper extremities, and lower extremities.  He says the pain persists despite IV Dilaudid PCA.  Plan: Added Haldol 1 mg by mouth every 6 hours as needed for increased agitation  OxyContin 20 mg every 12 hours for chronic pain   [name]  APRN, MSN, FNP-C [facility] [address] [place], [place] [address] [phone]

## 2018-11-05 NOTE — Progress Notes (Signed)
PROGRESS NOTE    Timothy Marks  TML:[id number] DOB: Nov 29, 1990 DOA: 11/03/2018 PCP: Lanae Boast, FNP    Brief Narrative:   Timothy Marks  is a 28 y.o. male, w sickle cell anemia,  osteonecrosis of the left shoulder, chronic pain syndrome, opiate dependence, oppositional defiance disorder , depression, vitamin D deficiency, who presents with c/o generalized pain per patient triggered by weather change. Pt states has pain in bilateral arms, left leg, back, chest. This is consistent with his sickle cell pain. Pt denies fever, chills, cough, palp, sob, n/v, abd pain, diarrhea, brbpr, dysuria, hematuria.   In ED, T 98.6, P 83  R 16, Bp 116/68  Pox 99% on RA, Wbc 13.3, Hgb 8.6, Plt 453, Na 138, K 5.0, Bun 7, Creatinine 0.42, AST 65, ALT 12, Alk phos 68, T. Bili 3.6. CXR pending. Pt received dilaudid 23m iv x 6 in ED, without relief. Pt will be admitted for sickle cell crisis.     Assessment & Plan:   Principal Problem:   Sickle cell crisis (HCC) Active Problems:   Hyperbilirubinemia   Thrombocythemia (HCC)   Abnormal liver function    Acute sickle cell pain crisis Patient presenting with intractable back, chest, leg pain consistent with his normal pattern of sickle cell pain crisis.  He also has associated nausea.  Patient was recently admitted and discharged by this sickle cell team on 11/02/2018 for similar episode.  Patient states he is compliant with his home medication regimen to include pain medication, folic acid,  And hydroxyurea.  Patient states he tries to maintain his hydration. --chest x-ray with no active disease process -- CBC with differential notable for sickle cells and target cells --retic count elevated at 13.9, up to 16.3 today --Continue home hydroxyurea 5031mPO BID;  Given his recurrence of SS crisis, may consider increasing  His hydroxyurea closer to 3532mg; but will defer to his sickle cell team --continue folic acid --Dilaudid PCA --D5 half-normal saline at  125 mL/hour --supportive care with Zofran and Benadryl prn --repeat retic count in a.m.   chronic pain syndrome --continue gabapentin   transaminitis  patient with noted elevated transaminases with bilirubin of 3.6 on admission.  Currently trending down. --AST 65-->38-->31 --ALT 12-->15-->12 --acute hepatitis panel pending --will hold off right upper quadrant ultrasound unless hepatic function worsens --Follow CMP daily  Hx Vitamin D  Deficiency -- continue home vitamin-D supplementation  DVT prophylaxis:  Lovenox, SCDs Code Status:  Full code Family Communication: none Disposition Plan:  Continue observation status, transfer to sickle cell team today   Consultants:   none  Procedures:   none  Antimicrobials:   none   Subjective: Patient seen examined at bedside, sleeping and resting comfortably until awakened.  Then starts writhing around bed complaining of persistent back, chest, and left leg pain.  States poor appetite.  Does not like hospital food.  No other complaints or concerns at this time.  Denies headache, no fever/ chills/ night sweats, no vomiting /diarrhea, no shortness of breath, no palpitations, no issues with bowel / bladder function, no paresthesias.  No acute events overnight per nurse staff.  Objective: Vitals:   11/05/18 0015 11/05/18 0208 11/05/18 0450 11/05/18 0615  BP:  (!) 104/52  (!) 118/51  Pulse:  86  83  Resp: 18 18 18 18   Temp:  99.3 F (37.4 C)    TempSrc:  Oral    SpO2: 100% 92% 100% 98%  Weight:      Height:  Intake/Output Summary (Last 24 hours) at 11/05/2018 5456 Last data filed at 11/05/2018 0444 Gross per 24 hour  Intake 2205.16 ml  Output 1250 ml  Net 955.16 ml   Filed Weights   11/03/18 2218  Weight: 61.2 kg    Examination:  General exam: Appears calm and comfortable,  Thin in appearance  Respiratory system: Clear to auscultation. Respiratory effort normal,   Oxygenating well on room air Cardiovascular  system: S1 & S2 heard, RRR. No JVD, murmurs, rubs, gallops or clicks. No pedal edema. Gastrointestinal system: Abdomen is nondistended, soft and nontender. No organomegaly or masses felt. Normal bowel sounds heard. Central nervous system: Alert and oriented. No focal neurological deficits. Extremities: Symmetric 5 x 5 power. Skin: No rashes, lesions or ulcers Psychiatry: Judgement and insight appear normal. Mood & affect appropriate.     Data Reviewed: I have personally reviewed following labs and imaging studies  CBC: Recent Labs  Lab 11/02/18 1055 11/03/18 2225 11/04/18 0523 11/05/18 0519  WBC 7.7 13.3* 16.9* 14.4*  NEUTROABS 4.3 6.6  --   --   HGB 8.9* 8.6* 8.5* 7.4*  HCT 26.4* 25.5* 25.2* 21.9*  MCV 91.0 92.4 92.6 92.4  PLT 491* 453* 449* 256*   Basic Metabolic Panel: Recent Labs  Lab 11/03/18 2225 11/04/18 0523  NA 138 139  K 5.0 4.0  CL 104 106  CO2 25 22  GLUCOSE 91 87  BUN 7 7  CREATININE 0.42* 0.50*  CALCIUM 8.9 9.0   GFR: Estimated Creatinine Clearance: 116.9 mL/min (A) (by C-G formula based on SCr of 0.5 mg/dL (L)). Liver Function Tests: Recent Labs  Lab 11/03/18 2225 11/04/18 0523  AST 65* 38  ALT 12 15  ALKPHOS 68 68  BILITOT 3.6* 3.7*  PROT 7.3 7.4  ALBUMIN 4.3 4.3   No results for input(s): LIPASE, AMYLASE in the last 168 hours. No results for input(s): AMMONIA in the last 168 hours. Coagulation Profile: No results for input(s): INR, PROTIME in the last 168 hours. Cardiac Enzymes: No results for input(s): CKTOTAL, CKMB, CKMBINDEX, TROPONINI in the last 168 hours. BNP (last 3 results) No results for input(s): PROBNP in the last 8760 hours. HbA1C: No results for input(s): HGBA1C in the last 72 hours. CBG: No results for input(s): GLUCAP in the last 168 hours. Lipid Profile: No results for input(s): CHOL, HDL, LDLCALC, TRIG, CHOLHDL, LDLDIRECT in the last 72 hours. Thyroid Function Tests: No results for input(s): TSH, T4TOTAL, FREET4,  T3FREE, THYROIDAB in the last 72 hours. Anemia Panel: Recent Labs    11/03/18 2225 11/05/18 0519  RETICCTPCT 13.9* 16.3*   Sepsis Labs: No results for input(s): PROCALCITON, LATICACIDVEN in the last 168 hours.  Recent Results (from the past 240 hour(s))  SARS CORONAVIRUS 2 (TAT 6-24 HRS) Nasopharyngeal     Status: None   Collection Time: 11/04/18  2:41 AM   Specimen: Nasopharyngeal  Result Value Ref Range Status   SARS Coronavirus 2 NEGATIVE NEGATIVE Final    Comment: (NOTE) SARS-CoV-2 target nucleic acids are NOT DETECTED. The SARS-CoV-2 RNA is generally detectable in upper and lower respiratory specimens during the acute phase of infection. Negative results do not preclude SARS-CoV-2 infection, do not rule out co-infections with other pathogens, and should not be used as the sole basis for treatment or other patient management decisions. Negative results must be combined with clinical observations, patient history, and epidemiological information. The expected result is Negative. Fact Sheet for Patients: SugarRoll.be Fact Sheet for Healthcare Providers: https://www.woods-mathews.com/ This test  is not yet approved or cleared by the [place] and  has been authorized for detection and/or diagnosis of SARS-CoV-2 by FDA under an Emergency Use Authorization (EUA). This EUA will remain  in effect (meaning this test can be used) for the duration of the COVID-19 declaration under Section 56 4(b)(1) of the Act, 21 U.S.C. section 360bbb-3(b)(1), unless the authorization is terminated or revoked sooner. Performed at [facility], [place] [address] [place], [place] [address]          Radiology Studies: Dg Chest Port 1 View  Result Date: 11/04/2018 CLINICAL DATA:  Sickle cell crisis EXAM: PORTABLE CHEST 1 VIEW COMPARISON:  October 11, 2018 FINDINGS: The heart size and mediastinal contours are within normal limits. Both lungs  are clear. The visualized skeletal structures are unremarkable. IMPRESSION: No active disease. Electronically Signed   By: Constance Holster M.D.   On: 11/04/2018 03:59        Scheduled Meds: . cholecalciferol  2,000 Units Oral Daily  . enoxaparin (LOVENOX) injection  40 mg Subcutaneous Daily  . folic acid  1 mg Oral Daily  . gabapentin  300 mg Oral TID  . HYDROmorphone   Intravenous Q4H  . hydroxyurea  500 mg Oral BID  . senna-docusate  1 tablet Oral BID   Continuous Infusions: . dextrose 5 % and 0.45% NaCl 125 mL/hr at 11/05/18 0213  . diphenhydrAMINE       LOS: 0 days    Time spent: 32 minutes spent on chart review, discussion with nursing staff, consultants, updating family and interview/physical exam; more than 50% of that time was spent in counseling and/or coordination of care.     Renda Pohlman J [place] DO [facility] Pager [phone]  If 7PM-7AM, please contact night-coverage [email or web address] Password [facility] 11/05/2018, 6:32 AM

## 2018-11-06 DIAGNOSIS — D5700 Hb-SS disease with crisis, unspecified: Principal | ICD-10-CM

## 2018-11-06 NOTE — Progress Notes (Signed)
Patient loss his IV site around 0400. 2 Nurses attempted but was unsuccessful. Patient fall back asleep. IV team consulted. Patient wake up around 0600 and started asking for pain med. Another nurse attempted and was successful. IV fluid and PCA restarted. Patient been using curse word this whole shift to communicate with staff.

## 2018-11-06 NOTE — Progress Notes (Signed)
Subjective: Timothy Marks, a 28 year old male with a medical history significant for sickle cell disease, chronic pain syndrome, opiate dependence, and opiate tolerance, history of oppositional defiance disorder, and anemia of chronic disease was admitted in sickle cell pain crisis. Patient states that pain intensity has not improved overnight.  Pain is primarily to left shoulder and left upper back.  He characterizes pain as constant and sharp.  Pain intensity 8/10. Patient denies headache, chest pain, shortness of breath, dysuria, nausea, vomiting, or diarrhea.  Objective:  Vital signs in last 24 hours:  Vitals:   11/06/18 0340 11/06/18 0739 11/06/18 1107 11/06/18 1109  BP:  (!) 106/52 (!) 101/53   Pulse:  90 83   Resp: 17 16 16 13   Temp:  98.9 F (37.2 C) 99.1 F (37.3 C)   TempSrc:  Oral Oral   SpO2:  100% 100% 100%  Weight:      Height:        Intake/Output from previous day:   Intake/Output Summary (Last 24 hours) at 11/06/2018 1217 Last data filed at 11/06/2018 0953 Gross per 24 hour  Intake 1727.53 ml  Output 2100 ml  Net -372.47 ml    Physical Exam: General: Alert, awake, oriented x3, in no acute distress.  HEENT: [place]/AT [place]ER[place], EOMI Neck: Trachea midline,  no masses, no thyromegal,y no JVD, no carotid bruit OROPHARYNX:  Moist, No exudate/ erythema/lesions.  Heart: Regular rate and rhythm, without murmurs, rubs, gallops, PMI non-displaced, no heaves or thrills on palpation.  Lungs: Clear to auscultation, no wheezing or rhonchi noted. No increased vocal fremitus resonant to percussion  Abdomen: Soft, nontender, nondistended, positive bowel sounds, no masses no hepatosplenomegaly noted..  Neuro: No focal neurological deficits noted cranial nerves II through XII grossly intact. DTRs 2+ bilaterally upper and lower extremities. Strength 5 out of 5 in bilateral upper and lower extremities. Musculoskeletal: No warm swelling or erythema around joints, no spinal tenderness  noted. Psychiatric: Patient alert and oriented x3, good insight and cognition, good recent to remote recall. Lymph node survey: No cervical axillary or inguinal lymphadenopathy noted.  Lab Results:  Basic Metabolic Panel:    Component Value Date/Time   NA 138 11/05/2018 0519   NA 139 08/31/2018 0854   K 3.9 11/05/2018 0519   CL 102 11/05/2018 0519   CO2 26 11/05/2018 0519   BUN 11 11/05/2018 0519   BUN 6 08/31/2018 0854   CREATININE 0.45 (L) 11/05/2018 0519   GLUCOSE 102 (H) 11/05/2018 0519   CALCIUM 9.0 11/05/2018 0519   CBC:    Component Value Date/Time   WBC 14.4 (H) 11/05/2018 0519   HGB 7.4 (L) 11/05/2018 0519   HGB 12.0 (L) 08/31/2018 0854   HCT 21.9 (L) 11/05/2018 0519   HCT 35.7 (L) 08/31/2018 0854   PLT 414 (H) 11/05/2018 0519   PLT 439 08/31/2018 0854   MCV 92.4 11/05/2018 0519   MCV 84 08/31/2018 0854   NEUTROABS 6.6 11/03/2018 2225   NEUTROABS 4.7 08/31/2018 0854   LYMPHSABS 4.6 (H) 11/03/2018 2225   LYMPHSABS 2.0 08/31/2018 0854   MONOABS 1.4 (H) 11/03/2018 2225   EOSABS 0.5 11/03/2018 2225   EOSABS 0.3 08/31/2018 0854   BASOSABS 0.1 11/03/2018 2225   BASOSABS 0.1 08/31/2018 0854    Recent Results (from the past 240 hour(s))  SARS CORONAVIRUS 2 (TAT 6-24 HRS) Nasopharyngeal     Status: None   Collection Time: 11/04/18  2:41 AM   Specimen: Nasopharyngeal  Result Value Ref Range Status  SARS Coronavirus 2 NEGATIVE NEGATIVE Final    Comment: (NOTE) SARS-CoV-2 target nucleic acids are NOT DETECTED. The SARS-CoV-2 RNA is generally detectable in upper and lower respiratory specimens during the acute phase of infection. Negative results do not preclude SARS-CoV-2 infection, do not rule out co-infections with other pathogens, and should not be used as the sole basis for treatment or other patient management decisions. Negative results must be combined with clinical observations, patient history, and epidemiological information. The expected result is  Negative. Fact Sheet for Patients: SugarRoll.be Fact Sheet for Healthcare Providers: https://www.woods-mathews.com/ This test is not yet approved or cleared by the [place] FDA and  has been authorized for detection and/or diagnosis of SARS-CoV-2 by FDA under an Emergency Use Authorization (EUA). This EUA will remain  in effect (meaning this test can be used) for the duration of the COVID-19 declaration under Section 56 4(b)(1) of the Act, 21 U.S.C. section 360bbb-3(b)(1), unless the authorization is terminated or revoked sooner. Performed at [facility], [place] [address] [place], [place] [address]     Studies/Results: No results found.  Medications: Scheduled Meds: . cholecalciferol  2,000 Units Oral Daily  . enoxaparin (LOVENOX) injection  40 mg Subcutaneous Daily  . feeding supplement (ENSURE ENLIVE)  237 mL Oral TID BM  . feeding supplement (PRO-STAT SUGAR FREE 64)  30 mL Oral BID  . folic acid  1 mg Oral Daily  . gabapentin  300 mg Oral TID  . HYDROmorphone   Intravenous Q4H  . hydroxyurea  500 mg Oral BID  . multivitamin with minerals  1 tablet Oral Daily  . oxyCODONE  20 mg Oral Q12H  . senna-docusate  1 tablet Oral BID   Continuous Infusions: . dextrose 5 % and 0.45% NaCl 75 mL/hr at 11/06/18 0613  . diphenhydrAMINE     PRN Meds:.diphenhydrAMINE **OR** diphenhydrAMINE, haloperidol, naloxone **AND** sodium chloride flush, ondansetron, polyethylene glycol   Assessment/Plan: Principal Problem:   Sickle cell crisis ([place]) Active Problems:   Hyperbilirubinemia   Thrombocythemia (HCC)   Abnormal liver function  Sickle cell disease with pain crisis: Continue IV Dilaudid via PCA per weight-based protocol.  No change in settings on today. Toradol 15 mg IV every 6 hours Maintain oxygen saturation above 90%, 2 L supplemental oxygen as needed. Monitor vital signs closely  Chronic pain syndrome: Continue OxyContin  20 mg every 12 hours Gabapentin 300 mg 3 times daily  Sickle cell anemia: Hemoglobin 7.4, which is below baseline.  Continue to monitor closely CBC in a.m.  Leukocytosis: WBCs 14.4.  Patient afebrile.  No sign of infection or inflammation.  No antibiotics warranted at this time.  Continue to monitor closely. Follow CBC  Oppositional defiance disorder: Increasingly agitated.  Haldol 2 mg every 6 hours as needed for increased agitation.   Code Status: Full Code Family Communication: N/A Disposition Plan: Not yet ready for discharge   [place], MSN, FNP-C Patient [place] [address] [place], [place] [address] [phone] If 5PM-7AM, please contact night-coverage.  11/06/2018, 12:17 PM  LOS: 1 day

## 2018-11-06 NOTE — Progress Notes (Signed)
Pt reports increased pain 10/10 on right rib cage that radiates across his chest to his left rib cage. Pt requested more pain medication, this RN informed pt that he did not have any more pain medication ordered at this time. NP [facility] notified. No new orders at this time. Will continue to monitor patient.

## 2018-11-07 LAB — COMPREHENSIVE METABOLIC PANEL
ALT: 14 U/L (ref 0–44)
AST: 26 U/L (ref 15–41)
Albumin: 4.3 g/dL (ref 3.5–5.0)
Alkaline Phosphatase: 58 U/L (ref 38–126)
Anion gap: 10 (ref 5–15)
BUN: 14 mg/dL (ref 6–20)
CO2: 25 mmol/L (ref 22–32)
Calcium: 9.1 mg/dL (ref 8.9–10.3)
Chloride: 99 mmol/L (ref 98–111)
Creatinine, Ser: 0.58 mg/dL — ABNORMAL LOW (ref 0.61–1.24)
GFR calc Af Amer: >60 mL/min (ref >60)
GFR calc non Af Amer: >60 mL/min (ref >60)
Glucose, Bld: 117 mg/dL — ABNORMAL HIGH (ref 70–99)
Potassium: 3.8 mmol/L (ref 3.5–5.1)
Sodium: 134 mmol/L — ABNORMAL LOW (ref 135–145)
Total Bilirubin: 5.7 mg/dL — ABNORMAL HIGH (ref 0.3–1.2)
Total Protein: 8.2 g/dL — ABNORMAL HIGH (ref 6.5–8.1)

## 2018-11-07 LAB — CBC
HCT: 17.5 % — ABNORMAL LOW (ref 39.0–52.0)
Hemoglobin: 6.1 g/dL — CL (ref 13.0–17.0)
MCH: 31 pg (ref 26.0–34.0)
MCHC: 34.9 g/dL (ref 30.0–36.0)
MCV: 88.8 fL (ref 80.0–100.0)
Platelets: 397 10*3/uL (ref 150–400)
RBC: 1.97 MIL/uL — ABNORMAL LOW (ref 4.22–5.81)
RDW: 17.2 % — ABNORMAL HIGH (ref 11.5–15.5)
WBC: 18.1 10*3/uL — ABNORMAL HIGH (ref 4.0–10.5)
nRBC: 1 % — ABNORMAL HIGH (ref 0.0–0.2)

## 2018-11-07 LAB — PREPARE RBC (CROSSMATCH)

## 2018-11-07 MED ORDER — ACETAMINOPHEN 325 MG PO TABS
650.0000 mg | ORAL_TABLET | Freq: Once | ORAL | Status: AC
  Administered 2018-11-07: 12:00:00 650 mg via ORAL
  Filled 2018-11-07: qty 2

## 2018-11-07 MED ORDER — HYDROMORPHONE HCL 1 MG/ML IJ SOLN
1.0000 mg | Freq: Once | INTRAMUSCULAR | Status: AC
  Administered 2018-11-07: 16:00:00 1 mg via INTRAVENOUS
  Filled 2018-11-07: qty 1

## 2018-11-07 MED ORDER — DIPHENHYDRAMINE HCL 50 MG/ML IJ SOLN
25.0000 mg | Freq: Once | INTRAMUSCULAR | Status: AC
  Administered 2018-11-07: 12:00:00 25 mg via INTRAVENOUS
  Filled 2018-11-07: qty 1

## 2018-11-07 NOTE — Progress Notes (Signed)
Subjective: Timothy Marks, a 28 year old male with a medical history significant for sickle cell disease, chronic pain syndrome, opiate dependence and tolerance, history of oppositional defiance disorder, and anemia of chronic disease was admitted for sickle cell pain crisis.  Patient states that pain has not improved overnight.  Pain intensity 8/10 primarily to low back and lower extremities.  Pain persists despite IV Dilaudid PCA.  He says the PCA was stopped for a time due to poor venous access.  Patient's hemoglobin is 6.1 today.  His baseline is 8.0-9.0 g/dL.  Patient is afebrile and maintaining oxygen saturation above 90%. He denies dizziness, paresthesias, headache, blurred vision, chest pain, nausea, vomiting, or diarrhea. Objective:  Vital signs in last 24 hours:  Vitals:   11/07/18 0839 11/07/18 0941 11/07/18 1138 11/07/18 1419  BP:  129/65  123/87  Pulse:  90  89  Resp: 13 16 11 16   Temp:  98.7 F (37.1 C)  98.2 F (36.8 C)  TempSrc:  Oral  Oral  SpO2: 100% 97% 98% 100%  Weight:      Height:        Intake/Output from previous day:   Intake/Output Summary (Last 24 hours) at 11/07/2018 1507 Last data filed at 11/07/2018 1420 Gross per 24 hour  Intake 720 ml  Output 2875 ml  Net -2155 ml    Physical Exam: General: Alert, awake, oriented x3, in no acute distress.  HEENT: [place]/AT [place], EOMI Neck: Trachea midline,  no masses, no thyromegal,y no JVD, no carotid bruit OROPHARYNX:  Moist, No exudate/ erythema/lesions.  Heart: Regular rate and rhythm, without murmurs, rubs, gallops, PMI non-displaced, no heaves or thrills on palpation.  Lungs: Clear to auscultation, no wheezing or rhonchi noted. No increased vocal fremitus resonant to percussion  Abdomen: Soft, nontender, nondistended, positive bowel sounds, no masses no hepatosplenomegaly noted..  Neuro: No focal neurological deficits noted cranial nerves II through XII grossly intact. DTRs 2+ bilaterally upper and lower  extremities. Strength 5 out of 5 in bilateral upper and lower extremities. Musculoskeletal: No warm swelling or erythema around joints, no spinal tenderness noted. Psychiatric: Patient alert and oriented x3, good insight and cognition, good recent to remote recall. Lymph node survey: No cervical axillary or inguinal lymphadenopathy noted.  Lab Results:  Basic Metabolic Panel:    Component Value Date/Time   NA 134 (L) 11/07/2018 0525   NA 139 08/31/2018 0854   K 3.8 11/07/2018 0525   CL 99 11/07/2018 0525   CO2 25 11/07/2018 0525   BUN 14 11/07/2018 0525   BUN 6 08/31/2018 0854   CREATININE 0.58 (L) 11/07/2018 0525   GLUCOSE 117 (H) 11/07/2018 0525   CALCIUM 9.1 11/07/2018 0525   CBC:    Component Value Date/Time   WBC 18.1 (H) 11/07/2018 0525   HGB 6.1 (LL) 11/07/2018 0525   HGB 12.0 (L) 08/31/2018 0854   HCT 17.5 (L) 11/07/2018 0525   HCT 35.7 (L) 08/31/2018 0854   PLT 397 11/07/2018 0525   PLT 439 08/31/2018 0854   MCV 88.8 11/07/2018 0525   MCV 84 08/31/2018 0854   NEUTROABS 6.6 11/03/2018 2225   NEUTROABS 4.7 08/31/2018 0854   LYMPHSABS 4.6 (H) 11/03/2018 2225   LYMPHSABS 2.0 08/31/2018 0854   MONOABS 1.4 (H) 11/03/2018 2225   EOSABS 0.5 11/03/2018 2225   EOSABS 0.3 08/31/2018 0854   BASOSABS 0.1 11/03/2018 2225   BASOSABS 0.1 08/31/2018 0854    Recent Results (from the past 240 hour(s))  SARS CORONAVIRUS 2 (TAT  6-24 HRS) Nasopharyngeal     Status: None   Collection Time: 11/04/18  2:41 AM   Specimen: Nasopharyngeal  Result Value Ref Range Status   SARS Coronavirus 2 NEGATIVE NEGATIVE Final    Comment: (NOTE) SARS-CoV-2 target nucleic acids are NOT DETECTED. The SARS-CoV-2 RNA is generally detectable in upper and lower respiratory specimens during the acute phase of infection. Negative results do not preclude SARS-CoV-2 infection, do not rule out co-infections with other pathogens, and should not be used as the sole basis for treatment or other patient  management decisions. Negative results must be combined with clinical observations, patient history, and epidemiological information. The expected result is Negative. Fact Sheet for Patients: SugarRoll.be Fact Sheet for Healthcare Providers: https://www.woods-mathews.com/ This test is not yet approved or cleared by the [place] FDA and  has been authorized for detection and/or diagnosis of SARS-CoV-2 by FDA under an Emergency Use Authorization (EUA). This EUA will remain  in effect (meaning this test can be used) for the duration of the COVID-19 declaration under Section 56 4(b)(1) of the Act, 21 U.S.C. section 360bbb-3(b)(1), unless the authorization is terminated or revoked sooner. Performed at [facility], [place] [address], [place], [place] [address]     Studies/Results: No results found.  Medications: Scheduled Meds: . cholecalciferol  2,000 Units Oral Daily  . enoxaparin (LOVENOX) injection  40 mg Subcutaneous Daily  . feeding supplement (ENSURE ENLIVE)  237 mL Oral TID BM  . feeding supplement (PRO-STAT SUGAR FREE 64)  30 mL Oral BID  . folic acid  1 mg Oral Daily  . gabapentin  300 mg Oral TID  . HYDROmorphone   Intravenous Q4H  .  HYDROmorphone (DILAUDID) injection  1 mg Intravenous Once  . hydroxyurea  500 mg Oral BID  . multivitamin with minerals  1 tablet Oral Daily  . oxyCODONE  20 mg Oral Q12H  . senna-docusate  1 tablet Oral BID   Continuous Infusions: . dextrose 5 % and 0.45% NaCl 20 mL/hr at 11/06/18 2303  . diphenhydrAMINE     PRN Meds:.diphenhydrAMINE **OR** diphenhydrAMINE, haloperidol, naloxone **AND** sodium chloride flush, ondansetron, polyethylene glycol  Consultants:  None  Procedures:  None  Antibiotics:  None  Assessment/Plan: Principal Problem:   Sickle cell crisis (HCC) Active Problems:   Hyperbilirubinemia   Thrombocythemia (HCC)   Abnormal liver function  Sickle cell  disease with pain crisis: Continue IV Dilaudid PCA.  No change in settings. Toradol 15 mg IV every 6 hours Maintain oxygen saturation above 90%, 2 L oxygen as needed Monitor vital signs closely  Chronic pain syndrome: Stable.  Continue home medications. Sickle cell anemia: Hemoglobin decreased to 6.1, below patient's baseline.  Transfuse 1 unit of PRBCs.  CBC in a.m.  Leukocytosis: Stable.  Patient afebrile.  No sign of infection or inflammation.  No antibiotics warranted at this time.  Continue to monitor closely.  Oppositional defiance disorder: Continue Haldol 2 mg every 6 hours as needed for increased agitation  Code Status: Full Code Family Communication: N/A Disposition Plan: Not yet ready for discharge  [place], MSN, FNP-C Patient [place], [place] [address] [phone]  If 5PM-7AM, please contact night-coverage.  11/07/2018, 3:07 PM  LOS: 2 days

## 2018-11-08 ENCOUNTER — Other Ambulatory Visit (HOSPITAL_COMMUNITY): Payer: Self-pay | Admitting: Interventional Radiology

## 2018-11-08 DIAGNOSIS — D473 Essential (hemorrhagic) thrombocythemia: Secondary | ICD-10-CM

## 2018-11-08 LAB — CBC
HCT: 21.2 % — ABNORMAL LOW (ref 39.0–52.0)
Hemoglobin: 7.5 g/dL — ABNORMAL LOW (ref 13.0–17.0)
MCH: 31 pg (ref 26.0–34.0)
MCHC: 35.4 g/dL (ref 30.0–36.0)
MCV: 87.6 fL (ref 80.0–100.0)
Platelets: 390 10*3/uL (ref 150–400)
RBC: 2.42 MIL/uL — ABNORMAL LOW (ref 4.22–5.81)
RDW: 17 % — ABNORMAL HIGH (ref 11.5–15.5)
WBC: 15 10*3/uL — ABNORMAL HIGH (ref 4.0–10.5)
nRBC: 1.7 % — ABNORMAL HIGH (ref 0.0–0.2)

## 2018-11-08 LAB — TYPE AND SCREEN
ABO/RH(D): A POS
Antibody Screen: NEGATIVE
Unit division: 0
Unit division: 0

## 2018-11-08 LAB — BPAM RBC
Blood Product Expiration Date: 202010232359
Blood Product Expiration Date: 202010232359
ISSUE DATE / TIME: 202009231419
ISSUE DATE / TIME: 202009231623
Unit Type and Rh: 600
Unit Type and Rh: 600

## 2018-11-08 MED ORDER — HYDROMORPHONE 1 MG/ML IV SOLN
INTRAVENOUS | Status: DC
  Administered 2018-11-08: 6 mg via INTRAVENOUS
  Administered 2018-11-09: 11 mg via INTRAVENOUS
  Administered 2018-11-09: 4 mg via INTRAVENOUS
  Administered 2018-11-09: 30 mg via INTRAVENOUS
  Filled 2018-11-08 (×2): qty 30

## 2018-11-08 MED ORDER — LIP MEDEX EX OINT
TOPICAL_OINTMENT | CUTANEOUS | Status: DC | PRN
  Filled 2018-11-08: qty 7

## 2018-11-08 MED ORDER — OXYCODONE HCL 5 MG PO TABS
10.0000 mg | ORAL_TABLET | ORAL | Status: DC | PRN
  Administered 2018-11-08 – 2018-11-11 (×4): 10 mg via ORAL
  Filled 2018-11-08 (×4): qty 2

## 2018-11-08 NOTE — Progress Notes (Addendum)
Subjective: Timothy Marks, a 28 year old male with a medical history significant for sickle cell disease, chronic pain syndrome, opiate dependence and tolerance, history of oppositional defiance disorder, and history of anemia of chronic disease was admitted for sickle cell pain crisis.  On arrival, patient very upset.  He was arguing with his girlfriend on the phone.  He said, "my pain is out of control".  Pain intensity 9/10 all over.  He says that he is constantly aching and has not gotten relief from Dilaudid PCA.  Patient also reports possible exposure to sexually transmitted infection.  He is requesting STD testing.  He has a new partner and has not been using barrier protection.  On 11/07/2018, patient's hemoglobin was 6.1 he was transfused 1 unit of PRBCs.  Hemoglobin has returned to baseline.    Objective:  Vital signs in last 24 hours:  Vitals:   11/08/18 0426 11/08/18 0628 11/08/18 0754 11/08/18 1132  BP:  139/64    Pulse:  89    Resp: 16 17 13 13   Temp:  99.1 F (37.3 C)    TempSrc:  Oral    SpO2: 99% 93% 100%   Weight:      Height:        Intake/Output from previous day:   Intake/Output Summary (Last 24 hours) at 11/08/2018 1237 Last data filed at 11/08/2018 0800 Gross per 24 hour  Intake 3806.81 ml  Output 2100 ml  Net 1706.81 ml    Physical Exam: General: Alert, awake, oriented x3, in no acute distress.  HEENT: [place]/AT [place], EOMI Neck: Trachea midline,  no masses, no thyromegal,y no JVD, no carotid bruit OROPHARYNX:  Moist, No exudate/ erythema/lesions.  Heart: Regular rate and rhythm, without murmurs, rubs, gallops, PMI non-displaced, no heaves or thrills on palpation.  Lungs: Clear to auscultation, no wheezing or rhonchi noted. No increased vocal fremitus resonant to percussion  Abdomen: Soft, nontender, nondistended, positive bowel sounds, no masses no hepatosplenomegaly noted..  Neuro: No focal neurological deficits noted cranial nerves II through XII  grossly intact. DTRs 2+ bilaterally upper and lower extremities. Strength 5 out of 5 in bilateral upper and lower extremities. Musculoskeletal: No warm swelling or erythema around joints, no spinal tenderness noted. Psychiatric: Patient alert and oriented x3, good insight and cognition, good recent to remote recall. Lymph node survey: No cervical axillary or inguinal lymphadenopathy noted.  Lab Results:  Basic Metabolic Panel:    Component Value Date/Time   NA 134 (L) 11/07/2018 0525   NA 139 08/31/2018 0854   K 3.8 11/07/2018 0525   CL 99 11/07/2018 0525   CO2 25 11/07/2018 0525   BUN 14 11/07/2018 0525   BUN 6 08/31/2018 0854   CREATININE 0.58 (L) 11/07/2018 0525   GLUCOSE 117 (H) 11/07/2018 0525   CALCIUM 9.1 11/07/2018 0525   CBC:    Component Value Date/Time   WBC 15.0 (H) 11/08/2018 0536   HGB 7.5 (L) 11/08/2018 0536   HGB 12.0 (L) 08/31/2018 0854   HCT 21.2 (L) 11/08/2018 0536   HCT 35.7 (L) 08/31/2018 0854   PLT 390 11/08/2018 0536   PLT 439 08/31/2018 0854   MCV 87.6 11/08/2018 0536   MCV 84 08/31/2018 0854   NEUTROABS 6.6 11/03/2018 2225   NEUTROABS 4.7 08/31/2018 0854   LYMPHSABS 4.6 (H) 11/03/2018 2225   LYMPHSABS 2.0 08/31/2018 0854   MONOABS 1.4 (H) 11/03/2018 2225   EOSABS 0.5 11/03/2018 2225   EOSABS 0.3 08/31/2018 0854   BASOSABS 0.1 11/03/2018 2225  BASOSABS 0.1 08/31/2018 0854    Recent Results (from the past 240 hour(s))  SARS CORONAVIRUS 2 (TAT 6-24 HRS) Nasopharyngeal     Status: None   Collection Time: 11/04/18  2:41 AM   Specimen: Nasopharyngeal  Result Value Ref Range Status   SARS Coronavirus 2 NEGATIVE NEGATIVE Final    Comment: (NOTE) SARS-CoV-2 target nucleic acids are NOT DETECTED. The SARS-CoV-2 RNA is generally detectable in upper and lower respiratory specimens during the acute phase of infection. Negative results do not preclude SARS-CoV-2 infection, do not rule out co-infections with other pathogens, and should not be used as  the sole basis for treatment or other patient management decisions. Negative results must be combined with clinical observations, patient history, and epidemiological information. The expected result is Negative. Fact Sheet for Patients: SugarRoll.be Fact Sheet for Healthcare Providers: https://www.woods-mathews.com/ This test is not yet approved or cleared by the [place] FDA and  has been authorized for detection and/or diagnosis of SARS-CoV-2 by FDA under an Emergency Use Authorization (EUA). This EUA will remain  in effect (meaning this test can be used) for the duration of the COVID-19 declaration under Section 56 4(b)(1) of the Act, 21 U.S.C. section 360bbb-3(b)(1), unless the authorization is terminated or revoked sooner. Performed at [facility], [place] [address], [place], [place] [address]     Studies/Results: No results found.  Medications: Scheduled Meds: . cholecalciferol  2,000 Units Oral Daily  . enoxaparin (LOVENOX) injection  40 mg Subcutaneous Daily  . feeding supplement (ENSURE ENLIVE)  237 mL Oral TID BM  . feeding supplement (PRO-STAT SUGAR FREE 64)  30 mL Oral BID  . folic acid  1 mg Oral Daily  . gabapentin  300 mg Oral TID  . HYDROmorphone   Intravenous Q4H  . hydroxyurea  500 mg Oral BID  . multivitamin with minerals  1 tablet Oral Daily  . oxyCODONE  20 mg Oral Q12H  . senna-docusate  1 tablet Oral BID   Continuous Infusions: . dextrose 5 % and 0.45% NaCl 20 mL/hr at 11/08/18 0444  . diphenhydrAMINE     PRN Meds:.diphenhydrAMINE **OR** diphenhydrAMINE, haloperidol, lip balm, naloxone **AND** sodium chloride flush, ondansetron, polyethylene glycol  Consultants:  None  Procedures:  None  Antibiotics:  None  Assessment/Plan: Principal Problem:   Sickle cell crisis (HCC) Active Problems:   Hyperbilirubinemia   Thrombocythemia (HCC)   Abnormal liver function   Sickle cell disease  with pain crisis: Continue IV Dilaudid via PCA.  I suspect the patient's pain persists due to increased stress.  Patient is not maximizing PCA Dilaudid.  Will start to wean today.  Settings changed to 0.5 mg, 10-minute lockout, and 2 mg/h. Monitor vital signs closely.  Chronic pain syndrome: Stable.  Continue home medications.  Sickle cell anemia: Hemoglobin 7.5.  Patient is s/p 1 unit PRBCs.  We will continue to monitor closely. CBC in a.m.  Leukocytosis: Improving.  Patient afebrile.  No signs of infection or inflammation.  No antibiotics warranted at this time.  Continue to monitor closely.  Oppositional defiance disorder: Continue Haldol 2 mg every 6 hours as needed for increased agitation. Estanislado Emms, LCSW was contacted to assist patients with social concerns.  Possible exposure to sexually transmitted infection:  Follow RPR, HIV, and GC/chlamydia. Discussed the importance of barrier protection at length. The patient does not seem to understand. He is focused on the social aspects of his situation.   Code Status: Full Code Family Communication: N/A Disposition  Plan: Not yet ready for discharge  [place], MSN, FNP-C Patient [place]  [place],  [address] [phone]  If 5PM-7AM, please contact night-coverage.  11/08/2018, 12:37 PM  LOS: 3 days

## 2018-11-09 ENCOUNTER — Telehealth: Payer: Self-pay

## 2018-11-09 ENCOUNTER — Other Ambulatory Visit: Payer: Self-pay | Admitting: Family Medicine

## 2018-11-09 LAB — URINE CYTOLOGY ANCILLARY ONLY
Chlamydia: NEGATIVE
Neisseria Gonorrhea: NEGATIVE

## 2018-11-09 LAB — HIV ANTIBODY (ROUTINE TESTING W REFLEX): HIV Screen 4th Generation wRfx: NONREACTIVE

## 2018-11-09 LAB — RPR: RPR Ser Ql: NONREACTIVE

## 2018-11-09 MED ORDER — HYDROMORPHONE 1 MG/ML IV SOLN
INTRAVENOUS | Status: DC
  Administered 2018-11-09: 7 mg via INTRAVENOUS
  Administered 2018-11-09: 0 mg via INTRAVENOUS
  Administered 2018-11-10: 1.2 mg via INTRAVENOUS
  Administered 2018-11-10: 1.5 mg via INTRAVENOUS
  Administered 2018-11-10: 5.1 mg via INTRAVENOUS
  Administered 2018-11-10: 4.5 mg via INTRAVENOUS
  Administered 2018-11-10: 3 mg via INTRAVENOUS
  Administered 2018-11-10: 30 mg via INTRAVENOUS
  Administered 2018-11-10: 3.9 mg via INTRAVENOUS
  Administered 2018-11-11 (×2): 1.5 mg via INTRAVENOUS
  Administered 2018-11-11: 3 mg via INTRAVENOUS
  Filled 2018-11-09: qty 30

## 2018-11-09 NOTE — BH Specialist Note (Signed)
Integrated Behavioral Health Referral Note  Reason for Referral: Timothy Marks is a 28 y.o. male  Pt was referred by NP, [place] for: social support needs  Pt reports the following concerns: medication refills, son's custody arrangement  Plan: 1. Addressed today: Met with patient at bedside in [place]. Assessed needs. Patient's only reported concern is about medication refills and custody arrangements of his son. Medication refill addressed with clinic.   Patient markedly angry and hurt about the circumstances of his son's current custody arrangement; emotionally labile during visit. Provided brief supportive counseling. Note patient has documented mental health diagnoses. Patient reports no prior mental health treatment or medication and is not agreeable to additional treatment at this time.    2. Referral: none at this time  3. Follow up: will continue to follow from clinic and provide support as needed  Estanislado Emms, [place] [phone]

## 2018-11-09 NOTE — Progress Notes (Signed)
Subjective: Timothy Marks, a 28 year old male with a medical history significant for sickle cell disease, chronic pain syndrome, opiate dependence and opiate tolerance, history of oppositional defiance disorder, and history of anemia of chronic disease was admitted for sickle cell pain crisis  Patient states that pain intensity has improved minimally overnight.  Pain is primarily to mid chest, shoulders, and lower extremities.  Pain intensity is 8/10.  Today, patient is upset about his chronic pain medications being to for refill on different days.  He wants his medications to be filled on the same day.  Informed patient that he needs to have this conversation with his PCP who prescribes home pain medications.  Patient currently Dizziness: "Chest pain, heart palpitations, shortness of breath, nausea, vomiting or diarrhea.  Objective:  Vital signs in last 24 hours:  Vitals:   11/09/18 0311 11/09/18 0803 11/09/18 0932 11/09/18 1345  BP:   114/68 91/75  Pulse:   80 78  Resp: 14 17 12 20   Temp:   98.8 F (37.1 C) 98.9 F (37.2 C)  TempSrc:   Oral Oral  SpO2: 100% 98% 100% 100%  Weight: 51.9 kg     Height:        Intake/Output from previous day:   Intake/Output Summary (Last 24 hours) at 11/09/2018 1348 Last data filed at 11/09/2018 0800 Gross per 24 hour  Intake -  Output 1100 ml  Net -1100 ml    Physical Exam: General: Alert, awake, oriented x3, in no acute distress.  HEENT: /AT PEERL, EOMI Neck: Trachea midline,  no masses, no thyromegal,y no JVD, no carotid bruit OROPHARYNX:  Moist, No exudate/ erythema/lesions.  Heart: Regular rate and rhythm, without murmurs, rubs, gallops, PMI non-displaced, no heaves or thrills on palpation.  Lungs: Clear to auscultation, no wheezing or rhonchi noted. No increased vocal fremitus resonant to percussion  Abdomen: Soft, nontender, nondistended, positive bowel sounds, no masses no hepatosplenomegaly noted..  Neuro: No focal neurological  deficits noted cranial nerves II through XII grossly intact. DTRs 2+ bilaterally upper and lower extremities. Strength 5 out of 5 in bilateral upper and lower extremities. Musculoskeletal: No warm swelling or erythema around joints, no spinal tenderness noted. Psychiatric: Patient alert and oriented x3, good insight and cognition, good recent to remote recall. Lymph node survey: No cervical axillary or inguinal lymphadenopathy noted.  Lab Results:  Basic Metabolic Panel:    Component Value Date/Time   NA 134 (L) 11/07/2018 0525   NA 139 08/31/2018 0854   K 3.8 11/07/2018 0525   CL 99 11/07/2018 0525   CO2 25 11/07/2018 0525   BUN 14 11/07/2018 0525   BUN 6 08/31/2018 0854   CREATININE 0.58 (L) 11/07/2018 0525   GLUCOSE 117 (H) 11/07/2018 0525   CALCIUM 9.1 11/07/2018 0525   CBC:    Component Value Date/Time   WBC 15.0 (H) 11/08/2018 0536   HGB 7.5 (L) 11/08/2018 0536   HGB 12.0 (L) 08/31/2018 0854   HCT 21.2 (L) 11/08/2018 0536   HCT 35.7 (L) 08/31/2018 0854   PLT 390 11/08/2018 0536   PLT 439 08/31/2018 0854   MCV 87.6 11/08/2018 0536   MCV 84 08/31/2018 0854   NEUTROABS 6.6 11/03/2018 2225   NEUTROABS 4.7 08/31/2018 0854   LYMPHSABS 4.6 (H) 11/03/2018 2225   LYMPHSABS 2.0 08/31/2018 0854   MONOABS 1.4 (H) 11/03/2018 2225   EOSABS 0.5 11/03/2018 2225   EOSABS 0.3 08/31/2018 0854   BASOSABS 0.1 11/03/2018 2225   BASOSABS 0.1 08/31/2018 0854  Recent Results (from the past 240 hour(s))  SARS CORONAVIRUS 2 (TAT 6-24 HRS) Nasopharyngeal     Status: None   Collection Time: 11/04/18  2:41 AM   Specimen: Nasopharyngeal  Result Value Ref Range Status   SARS Coronavirus 2 NEGATIVE NEGATIVE Final    Comment: (NOTE) SARS-CoV-2 target nucleic acids are NOT DETECTED. The SARS-CoV-2 RNA is generally detectable in upper and lower respiratory specimens during the acute phase of infection. Negative results do not preclude SARS-CoV-2 infection, do not rule out co-infections  with other pathogens, and should not be used as the sole basis for treatment or other patient management decisions. Negative results must be combined with clinical observations, patient history, and epidemiological information. The expected result is Negative. Fact Sheet for Patients: SugarRoll.be Fact Sheet for Healthcare Providers: https://www.woods-mathews.com/ This test is not yet approved or cleared by the [place] FDA and  has been authorized for detection and/or diagnosis of SARS-CoV-2 by FDA under an Emergency Use Authorization (EUA). This EUA will remain  in effect (meaning this test can be used) for the duration of the COVID-19 declaration under Section 56 4(b)(1) of the Act, 21 U.S.C. section 360bbb-3(b)(1), unless the authorization is terminated or revoked sooner. Performed at [facility], [place] [address], [place], [place] [address]     Studies/Results: No results found.  Medications: Scheduled Meds: . cholecalciferol  2,000 Units Oral Daily  . enoxaparin (LOVENOX) injection  40 mg Subcutaneous Daily  . feeding supplement (ENSURE ENLIVE)  237 mL Oral TID BM  . feeding supplement (PRO-STAT SUGAR FREE 64)  30 mL Oral BID  . folic acid  1 mg Oral Daily  . gabapentin  300 mg Oral TID  . HYDROmorphone   Intravenous Q4H  . hydroxyurea  500 mg Oral BID  . multivitamin with minerals  1 tablet Oral Daily  . oxyCODONE  20 mg Oral Q12H  . senna-docusate  1 tablet Oral BID   Continuous Infusions: . dextrose 5 % and 0.45% NaCl 20 mL/hr at 11/08/18 0444  . diphenhydrAMINE     PRN Meds:.diphenhydrAMINE **OR** diphenhydrAMINE, haloperidol, lip balm, naloxone **AND** sodium chloride flush, ondansetron, oxyCODONE, polyethylene glycol   Assessment/Plan: Principal Problem:   Sickle cell crisis ([place]) Active Problems:   Hyperbilirubinemia   Thrombocythemia (HCC)   Abnormal liver function  Sickle cell disease with pain  crisis: Weaning IV Dilaudid via PCA. Oxycodone 10 mg every 4 hours as needed for severe breakthrough pain Continue to monitor vitals closely.  Chronic pain syndrome: Stable.  Continue home medications.  Sickle cell anemia: Hemoglobin stable.  Patient is s/p 1 unit PRBCs.  Continue to follow CBC.   Leukocytosis: Improving.  Patient afebrile.  No signs of infection or inflammation.  Antibiotics not warranted at this time.  Continue to monitor closely.  Oppositional defiant disorder: Continue Haldol 2 mg every 6 hours as needed for increased agitation.  Possible exposure to sexually transmitted infection: Patient asymptomatic.  RPR, HIV, and GC/chlamydia pending.  Review with results become available. Code Status: Full Code Family Communication: N/A Disposition Plan: Not yet ready for discharge.  Possible discharge on 11/10/2018  [name]  APRN, MSN, FNP-C [facility] [place], [place] [address] [phone]  If 5PM-7AM, please contact night-coverage.  11/09/2018, 1:48 PM  LOS: 4 days

## 2018-11-09 NOTE — Telephone Encounter (Signed)
Refill request for oxycodone 10/325mg . Patient states he is about to be discharged and wants to make sure his rx will be sent in. Please advise.

## 2018-11-10 MED ORDER — POLYVINYL ALCOHOL 1.4 % OP SOLN
1.0000 [drp] | OPHTHALMIC | Status: DC | PRN
  Filled 2018-11-10: qty 15

## 2018-11-10 NOTE — Progress Notes (Signed)
Patient had been refusing vital signs, therefore his PCA was turned off. MD came and spoke with patient. Once the patient agreed to have his vital signs taken, the PCA was turned back on. Once this RN told him he would have to keep his EtCO2 and SpO2 monitors on he became verbally aggressive then proceeded to put his monitors on. Will continue to monitor.

## 2018-11-10 NOTE — Progress Notes (Signed)
Subjective: A 28 yo with history of sickle cell disease who was admitted with sickle cell painful crisis.  Patient is currently on Dilaudid PCA.  He is completed Toradol.  Some IV fluids.  Patient has initially been refusing labs but has agreed to have labs drawn today.  Denied any fever or chills denied any nausea vomiting or diarrhea.  Still has pain is 6 out of 10 in his limbs.  Objective: Vital signs in last 24 hours: Temp:  [97.9 F (36.6 C)-98.6 F (37 C)] 98 F (36.7 C) (09/26 1335) Pulse Rate:  [70-82] 82 (09/26 1335) Resp:  [12-17] 12 (09/26 1602) BP: (106-136)/(55-76) 136/66 (09/26 1335) SpO2:  [98 %-100 %] 100 % (09/26 1602) Weight change:  Last BM Date: 11/05/18  Intake/Output from previous day: 09/25 0701 - 09/26 0700 In: 240 [P.O.:240] Out: 1700 [Urine:1700] Intake/Output this shift: No intake/output data recorded.  General appearance: alert, cooperative, appears stated age and no distress Head: Normocephalic, without obvious abnormality, atraumatic Eyes: conjunctivae/corneas clear. PERRL, EOM's intact. Fundi benign. Neck: no adenopathy, no carotid bruit, no JVD, supple, symmetrical, trachea midline and thyroid not enlarged, symmetric, no tenderness/mass/nodules Back: symmetric, no curvature. ROM normal. No CVA tenderness. Resp: clear to auscultation bilaterally Cardio: regular rate and rhythm, S1, S2 normal, no murmur, click, rub or gallop GI: soft, non-tender; bowel sounds normal; no masses,  no organomegaly Extremities: extremities normal, atraumatic, no cyanosis or edema Pulses: 2+ and symmetric Skin: Skin color, texture, turgor normal. No rashes or lesions Neurologic: Grossly normal  Lab Results: Recent Labs    11/08/18 0536  WBC 15.0*  HGB 7.5*  HCT 21.2*  PLT 390   BMET No results for input(s): NA, K, CL, CO2, GLUCOSE, BUN, CREATININE, CALCIUM in the last 72 hours.  Studies/Results: No results found.  Medications: I have reviewed the patient's  current medications.  Assessment/Plan: A 28 year old admitted with sickle cell painful crisis.  #1 sickle cell painful crisis: Patient will be maintained on Dilaudid PCA Toradol and IV fluids.  Discussed with patient regarding taking his medications and maintaining them.  Will follow closely  #2 sickle cell anemia: H&H is stable.  Continue to follow  #3 leukocytosis: Secondary to vaso-occlusive crisis.  Continue monitor  #4 depression with anxiety: Counseling provided.   LOS: 5 days   GARBA,LAWAL 11/10/2018, 8:19 PM

## 2018-11-11 LAB — CREATININE, SERUM
Creatinine, Ser: 0.5 mg/dL — ABNORMAL LOW (ref 0.61–1.24)
GFR calc Af Amer: >60 mL/min (ref >60)
GFR calc non Af Amer: >60 mL/min (ref >60)

## 2018-11-11 MED ORDER — OXYCODONE-ACETAMINOPHEN 10-325 MG PO TABS: 1.0000 | ORAL_TABLET | Freq: Four times a day (QID) | ORAL | 0 refills | Status: DC | PRN

## 2018-11-11 NOTE — Discharge Summary (Signed)
Physician Discharge Summary  Patient ID: Timothy Marks MRN: [id number] DOB/AGE: 1991/02/12 28 y.o.  Admit date: 11/03/2018 Discharge date: 11/11/2018  Admission Diagnoses:  Discharge Diagnoses:  Principal Problem:   Sickle cell crisis (Timothy Marks) Active Problems:   Hyperbilirubinemia   Thrombocythemia (Timothy Marks)   Abnormal liver function   Discharged Condition: good  Hospital Course: Patient is a 28 year old gentleman with known history of sickle cell disease who was admitted with sickle cell painful crisis.  Patient was treated with IV Dilaudid PCA Toradol and IV fluids.  Also has abnormal liver function as well as thrombocythemia.  He also has been on oral pain medications.  So far he has felt much better he has been transitioned to his oral home medications at discharge.  We will follow-up with PCP.  Consults: None  Significant Diagnostic Studies: labs: Serial CBCs and CMP is checked.  No transfusion  Treatments: IV hydration and analgesia: acetaminophen and Dilaudid  Discharge Exam: Blood pressure 123/74, pulse 87, temperature 98.7 F (37.1 C), temperature source Oral, resp. rate 15, height 6\' 2"  (1.88 m), weight 53.1 kg, SpO2 100 %. General appearance: alert, cooperative, appears stated age and no distress Head: Normocephalic, without obvious abnormality, atraumatic Neck: no adenopathy, no carotid bruit, no JVD, supple, symmetrical, trachea midline and thyroid not enlarged, symmetric, no tenderness/mass/nodules Back: symmetric, no curvature. ROM normal. No CVA tenderness. Resp: clear to auscultation bilaterally Cardio: regular rate and rhythm, S1, S2 normal, no murmur, click, rub or gallop GI: soft, non-tender; bowel sounds normal; no masses,  no organomegaly Extremities: extremities normal, atraumatic, no cyanosis or edema Pulses: 2+ and symmetric Skin: Skin color, texture, turgor normal. No rashes or lesions Neurologic: Grossly normal  Disposition: Discharge disposition: 01-Home  or Self Care       Discharge Instructions    Diet - low sodium heart healthy   Complete by: As directed    Increase activity slowly   Complete by: As directed      Allergies as of 11/11/2018      Reactions   Buprenorphine Hcl Hives   Levaquin [levofloxacin] Itching   Meperidine Rash   Morphine And Related Hives   Toradol [ketorolac Tromethamine] Itching   Tramadol Hives   Vancomycin Itching   Zosyn [piperacillin Sod-tazobactam So] Itching, Rash   Has taken rocephin in past      Medication List    TAKE these medications   aspirin EC 81 MG tablet Take 1 tablet (81 mg total) by mouth daily.   cholecalciferol 25 MCG (1000 UT) tablet Commonly known as: VITAMIN D Take 2 tablets (2,000 Units total) by mouth daily.   clindamycin 300 MG capsule Commonly known as: CLEOCIN Take 1 capsule (300 mg total) by mouth 4 (four) times daily. X 7 days   folic acid 1 MG tablet Commonly known as: FOLVITE Take 1 tablet (1 mg total) by mouth daily.   gabapentin 300 MG capsule Commonly known as: NEURONTIN TAKE 1 CAPSULE(300 MG) BY MOUTH THREE TIMES DAILY What changed: See the new instructions.   hydroxyurea 500 MG capsule Commonly known as: HYDREA Take 1 capsule (500 mg total) by mouth 2 (two) times daily. May take with food to minimize GI side effects.   ibuprofen 800 MG tablet Commonly known as: ADVIL Take 1 tablet (800 mg total) by mouth 3 (three) times daily as needed (pain).   oxyCODONE 20 mg 12 hr tablet Commonly known as: OXYCONTIN Take 1 tablet (20 mg total) by mouth every 12 (twelve) hours.  oxyCODONE-acetaminophen 10-325 MG tablet Commonly known as: PERCOCET Take 1 tablet by mouth every 6 (six) hours as needed for up to 15 days for pain.        SignedBarbette Merino 11/11/2018, 1:48 PM   Time spent 33 minutes

## 2018-11-12 ENCOUNTER — Telehealth: Payer: Self-pay

## 2018-11-12 ENCOUNTER — Other Ambulatory Visit: Payer: Self-pay | Admitting: Internal Medicine

## 2018-11-12 DIAGNOSIS — D571 Sickle-cell disease without crisis: Secondary | ICD-10-CM

## 2018-11-12 MED ORDER — OXYCODONE-ACETAMINOPHEN 10-325 MG PO TABS: 1.0000 | ORAL_TABLET | Freq: Four times a day (QID) | ORAL | 0 refills | Status: DC | PRN

## 2018-11-12 NOTE — Telephone Encounter (Signed)
Dr. Doreene Burke,  Patient needs refill for oxycodone 10/325mg . This looks like it was refilled by Dr. Jonelle Sidle yesterday however the pharmacy never received this rx. Can you refill? Please advise.

## 2018-11-12 NOTE — Telephone Encounter (Signed)
Resent

## 2018-11-12 NOTE — Telephone Encounter (Signed)
Called, and informed patient that this has been sent in. Thanks!

## 2018-11-12 NOTE — Telephone Encounter (Signed)
Dr. Doreene Burke, It is being sent as a fax and needs to be sent as an e-scribe rx. Can you change this and try to send in once more?

## 2018-11-13 ENCOUNTER — Encounter: Payer: Self-pay | Admitting: Family Medicine

## 2018-11-13 ENCOUNTER — Ambulatory Visit: Payer: Self-pay | Admitting: Family Medicine

## 2018-11-13 ENCOUNTER — Other Ambulatory Visit: Payer: Self-pay

## 2018-11-13 ENCOUNTER — Ambulatory Visit (INDEPENDENT_AMBULATORY_CARE_PROVIDER_SITE_OTHER): Payer: Medicaid Other | Admitting: Family Medicine

## 2018-11-13 VITALS — BP 122/67 | HR 84 | Temp 98.7°F | Resp 14 | Ht 74.0 in | Wt 113.0 lb

## 2018-11-13 DIAGNOSIS — Z79891 Long term (current) use of opiate analgesic: Secondary | ICD-10-CM | POA: Diagnosis not present

## 2018-11-13 DIAGNOSIS — D72829 Elevated white blood cell count, unspecified: Secondary | ICD-10-CM

## 2018-11-13 DIAGNOSIS — G894 Chronic pain syndrome: Secondary | ICD-10-CM

## 2018-11-13 DIAGNOSIS — D571 Sickle-cell disease without crisis: Secondary | ICD-10-CM

## 2018-11-13 DIAGNOSIS — R636 Underweight: Secondary | ICD-10-CM

## 2018-11-13 LAB — POCT URINALYSIS DIPSTICK
Bilirubin, UA: NEGATIVE
Blood, UA: NEGATIVE
Glucose, UA: NEGATIVE
Ketones, UA: NEGATIVE
Leukocytes, UA: NEGATIVE
Nitrite, UA: NEGATIVE
Protein, UA: NEGATIVE
Spec Grav, UA: 1.02 (ref 1.010–1.025)
Urobilinogen, UA: 2 E.U./dL — AB
pH, UA: 8.5 — AB (ref 5.0–8.0)

## 2018-11-13 MED ORDER — ENSURE ACTIVE HIGH PROTEIN PO LIQD: 1.0000 | Freq: Three times a day (TID) | ORAL | 11 refills | Status: DC

## 2018-11-13 NOTE — Patient Instructions (Signed)
Sickle Cell Anemia, Adult ° °Sickle cell anemia is a condition where your red blood cells are shaped like sickles. Red blood cells carry oxygen through the body. Sickle-shaped cells do not live as long as normal red blood cells. They also clump together and block blood from flowing through the blood vessels. This prevents the body from getting enough oxygen. Sickle cell anemia causes organ damage and pain. It also increases the risk of infection. °Follow these instructions at home: °Medicines °· Take over-the-counter and prescription medicines only as told by your doctor. °· If you were prescribed an antibiotic medicine, take it as told by your doctor. Do not stop taking the antibiotic even if you start to feel better. °· If you develop a fever, do not take medicines to lower the fever right away. Tell your doctor about the fever. °Managing pain, stiffness, and swelling °· Try these methods to help with pain: °? Use a heating pad. °? Take a warm bath. °? Distract yourself, such as by watching TV. °Eating and drinking °· Drink enough fluid to keep your pee (urine) clear or pale yellow. Drink more in hot weather and during exercise. °· Limit or avoid alcohol. °· Eat a healthy diet. Eat plenty of fruits, vegetables, whole grains, and lean protein. °· Take vitamins and supplements as told by your doctor. °Traveling °· When traveling, keep these with you: °? Your medical information. °? The names of your doctors. °? Your medicines. °· If you need to take an airplane, talk to your doctor first. °Activity °· Rest often. °· Avoid exercises that make your heart beat much faster, such as jogging. °General instructions °· Do not use products that have nicotine or tobacco, such as cigarettes and e-cigarettes. If you need help quitting, ask your doctor. °· Consider wearing a medical alert bracelet. °· Avoid being in high places (high altitudes), such as mountains. °· Avoid very hot or cold temperatures. °· Avoid places where the  temperature changes a lot. °· Keep all follow-up visits as told by your doctor. This is important. °Contact a doctor if: °· A joint hurts. °· Your feet or hands hurt or swell. °· You feel tired (fatigued). °Get help right away if: °· You have symptoms of infection. These include: °? Fever. °? Chills. °? Being very tired. °? Irritability. °? Poor eating. °? Throwing up (vomiting). °· You feel dizzy or faint. °· You have new stomach pain, especially on the left side. °· You have a an erection (priapism) that lasts more than 4 hours. °· You have numbness in your arms or legs. °· You have a hard time moving your arms or legs. °· You have trouble talking. °· You have pain that does not go away when you take medicine. °· You are short of breath. °· You are breathing fast. °· You have a long-term cough. °· You have pain in your chest. °· You have a bad headache. °· You have a stiff neck. °· Your stomach looks bloated even though you did not eat much. °· Your skin is pale. °· You suddenly cannot see well. °Summary °· Sickle cell anemia is a condition where your red blood cells are shaped like sickles. °· Follow your doctor's advice on ways to manage pain, food to eat, activities to do, and steps to take for safe travel. °· Get medical help right away if you have any signs of infection, such as a fever. °This information is not intended to replace advice given to you by   your health care provider. Make sure you discuss any questions you have with your health care provider. °Document Released: 11/21/2012 Document Revised: 05/25/2018 Document Reviewed: 03/08/2016 °Elsevier Patient Education © 2020 Elsevier Inc. ° °

## 2018-11-13 NOTE — Progress Notes (Signed)
Established Patient Office Visit  Subjective:  Patient ID: Timothy Marks, male    DOB: Aug 24, 1990  Age: 28 y.o. MRN: [id number]  CC:  Chief Complaint  Patient presents with  . Sickle Cell Anemia    HPI Timothy Marks, a 28 year old male with a medical history significant for sickle cell disease, chronic pain syndrome, opiate dependence, opiate tolerance, history of anemia of chronic disease, and history of oppositional defiant disorder presents for post hospital follow-up of sickle cell pain crisis.  Patient states that he is doing well today and pain is been controlled on home opiate medications.  Pain intensity is 4/10 characterized as intermittent and aching.  He states that he has been taking medications consistently since hospital discharge.  Patient was admitted from 9/19- 11/09/2018.  Patient also states that he has been taking folic acid and hydroxyurea.  During admission, hemoglobin decreased to 6.1, which was below patient's baseline.  He was transfused 1 unit of packed red blood cells.  Prior to discharge, hemoglobin was 7.5.  Today, patient denies fatigue, dizziness, paresthesias, dysuria, nausea, vomiting, or diarrhea.  Past Medical History:  Diagnosis Date  . Depression   . 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 Sickle cell anemia (HCC)   . Vitamin D deficiency y-6    Past Surgical History:  Procedure Laterality Date  . CHOLECYSTECTOMY      Family History  Problem Relation Age of Onset  . Sickle cell trait Mother   . Sickle cell trait Father     Social History   Socioeconomic History  . Marital status: Single    Spouse name: Not on file  . Number of children: Not on file  . Years of education: Not on file  . Highest education level: Not on file  Occupational History  . Not on file  Social Needs  . Financial resource strain: Not on file  . Food insecurity    Worry: Not on file    Inability: Not  on file  . Transportation needs    Medical: Not on file    Non-medical: Not on file  Tobacco Use  . Smoking status: Never Smoker  . Smokeless tobacco: Never Used  Substance and Sexual Activity  . Alcohol use: No  . Drug use: Not Currently    Types: Marijuana  . Sexual activity: Not on file  Lifestyle  . Physical activity    Days per week: Not on file    Minutes per session: Not on file  . Stress: Not on file  Relationships  . Social Herbalist on phone: Not on file    Gets together: Not on file    Attends religious service: Not on file    Active member of club or organization: Not on file    Attends meetings of clubs or organizations: Not on file    Relationship status: Not on file  . Intimate partner violence    Fear of current or ex partner: Not on file    Emotionally abused: Not on file    Physically abused: Not on file    Forced sexual activity: Not on file  Other Topics Concern  . Not on file  Social History Narrative  . Not on file    Outpatient Medications Prior to Visit  Medication Sig Dispense Refill  . cholecalciferol (VITAMIN D) 1000 units tablet Take 2 tablets (2,000 Units total) by mouth daily. 30 tablet  6  . clindamycin (CLEOCIN) 300 MG capsule Take 1 capsule (300 mg total) by mouth 4 (four) times daily. X 7 days 28 capsule 0  . folic acid (FOLVITE) 1 MG tablet Take 1 tablet (1 mg total) by mouth daily. 90 tablet 2  . gabapentin (NEURONTIN) 300 MG capsule TAKE 1 CAPSULE(300 MG) BY MOUTH THREE TIMES DAILY (Patient taking differently: Take 300 mg by mouth 3 (three) times daily. ) 90 capsule 0  . hydroxyurea (HYDREA) 500 MG capsule Take 1 capsule (500 mg total) by mouth 2 (two) times daily. May take with food to minimize GI side effects. 180 capsule 2  . ibuprofen (ADVIL,MOTRIN) 800 MG tablet Take 1 tablet (800 mg total) by mouth 3 (three) times daily as needed (pain). 30 tablet 0  . oxyCODONE (OXYCONTIN) 20 mg 12 hr tablet Take 1 tablet (20 mg total)  by mouth every 12 (twelve) hours. 64 tablet 0  . oxyCODONE-acetaminophen (PERCOCET) 10-325 MG tablet Take 1 tablet by mouth every 6 (six) hours as needed for up to 15 days for pain. 60 tablet 0  . aspirin EC 81 MG tablet Take 1 tablet (81 mg total) by mouth daily. (Patient not taking: Reported on 10/11/2018) 30 tablet 11   No facility-administered medications prior to visit.     Allergies  Allergen Reactions  . Buprenorphine Hcl Hives  . Levaquin [Levofloxacin] Itching  . Meperidine Rash  . Morphine And Related Hives  . Toradol [Ketorolac Tromethamine] Itching  . Tramadol Hives  . Vancomycin Itching  . Zosyn [Piperacillin Sod-Tazobactam So] Itching and Rash    Has taken rocephin in past    ROS Review of Systems  Constitutional: Positive for unexpected weight change.  HENT: Negative.   Eyes: Negative.   Respiratory: Negative.   Cardiovascular: Negative.   Gastrointestinal: Negative.   Endocrine: Negative.   Genitourinary: Negative for difficulty urinating.  Musculoskeletal: Positive for back pain.  Skin: Negative.   Neurological: Negative.   Hematological: Negative.   Psychiatric/Behavioral: Negative.       Objective:    Physical Exam  BP 122/67 (BP Location: Left Arm, Patient Position: Sitting, Cuff Size: Normal)   Pulse 84   Temp 98.7 F (37.1 C) (Oral)   Resp 14   Ht 6\' 2"  (1.88 m)   Wt 113 lb (51.3 kg)   SpO2 100%   BMI 14.51 kg/m  Wt Readings from Last 3 Encounters:  11/13/18 113 lb (51.3 kg)  11/11/18 117 lb 1 oz (53.1 kg)  10/29/18 135 lb (61.2 kg)     There are no preventive care reminders to display for this patient.  There are no preventive care reminders to display for this patient.  Lab Results  Component Value Date   TSH 0.884 08/31/2018   Lab Results  Component Value Date   WBC 15.0 (H) 11/08/2018   HGB 7.5 (L) 11/08/2018   HCT 21.2 (L) 11/08/2018   MCV 87.6 11/08/2018   PLT 390 11/08/2018   Lab Results  Component Value Date    NA 134 (L) 11/07/2018   K 3.8 11/07/2018   CO2 25 11/07/2018   GLUCOSE 117 (H) 11/07/2018   BUN 14 11/07/2018   CREATININE 0.50 (L) 11/11/2018   BILITOT 5.7 (H) 11/07/2018   ALKPHOS 58 11/07/2018   AST 26 11/07/2018   ALT 14 11/07/2018   PROT 8.2 (H) 11/07/2018   ALBUMIN 4.3 11/07/2018   CALCIUM 9.1 11/07/2018   ANIONGAP 10 11/07/2018   No results found for:  CHOL No results found for: HDL No results found for: LDLCALC No results found for: TRIG No results found for: CHOLHDL No results found for: HGBA1C    Assessment & Plan:   Problem List Items Addressed This Visit      Other   Sickle cell anemia (HCC) - Primary (Chronic)   Relevant Orders   Urinalysis Dipstick      Hb-SS disease without crisis ([place]) Sickle cell disease - Continue Hydrea.. We discussed the need for good hydration, monitoring of hydration status, avoidance of heat, cold, stress, and infection triggers. We discussed the risks and benefits of Hydrea, including bone marrow suppression, the possibility of GI upset, skin ulcers, hair thinning, and teratogenicity. The patient was reminded of the need to seek medical attention of any symptoms of bleeding, anemia, or infection. Continue folic acid 1 mg daily to prevent aplastic bone marrow crises.   Pulmonary evaluation - Patient denies severe recurrent wheezes, shortness of breath with exercise, or persistent cough. If these symptoms develop, pulmonary function tests with spirometry will be ordered, and if abnormal, plan on referral to Pulmonology for further evaluation.  Cardiac - Routine screening for pulmonary hypertension is not recommended.  Eye - High risk of proliferative retinopathy. Annual eye exam with retinal exam recommended to patient.  Immunization status - He declines vaccinations on today.    Acute and chronic painful episodes - Patient has a pain medication agreement  In place  Iron overload from chronic transfusion.  Previous ferritin greater  than 1000. He has been transfused multiple times over the past year. May warrant iron chelator.   - Urinalysis Dipstick - CBC with Differential - Ferritin  Chronic pain syndrome - [id number]+Oxyco+Alc+Crt-Bund  Chronic prescription opiate use - [id number] 11+Oxyco+Alc+Crt-Bund  Leukocytosis, unspecified type Previous WBCs mildly elevated. Suspected to have been reactive in response to sickle cell crisis. Patient is afebrile today and there are no signs of infection or inflammation. Repeat CBC on today.  - CBC with Differential  Patient underweight Estimated body mass index is 14.51 kg/m as calculated from the following:   Height as of this encounter: 6\' 2"  (1.88 m).   Weight as of this encounter: 113 lb (51.3 kg).  Patient warrants referral to nutritionist.   - Nutritional Supplements (ENSURE ACTIVE HIGH PROTEIN) LIQD; Take 1 each by mouth 3 (three) times daily between meals. #90 cans of Ensure  Dispense: 237 mL; Refill: 11  Follow-up: Return in about 2 months (around 01/13/2019) for sickle cell anemia.    [name]  APRN, MSN, FNP-C [facility] [address] [place], [place] [address] [phone]

## 2018-11-14 ENCOUNTER — Other Ambulatory Visit: Payer: Self-pay | Admitting: Family Medicine

## 2018-11-14 LAB — CBC WITH DIFFERENTIAL/PLATELET
Basophils Absolute: 0 10*3/uL (ref 0.0–0.2)
Basos: 0 %
EOS (ABSOLUTE): 0 10*3/uL (ref 0.0–0.4)
Eos: 0 %
Hematocrit: 25.1 % — ABNORMAL LOW (ref 37.5–51.0)
Hemoglobin: 8 g/dL — ABNORMAL LOW (ref 13.0–17.7)
Immature Grans (Abs): 0 10*3/uL (ref 0.0–0.1)
Immature Granulocytes: 0 %
Lymphocytes Absolute: 2.9 10*3/uL (ref 0.7–3.1)
Lymphs: 31 %
MCH: 30.4 pg (ref 26.6–33.0)
MCHC: 31.9 g/dL (ref 31.5–35.7)
MCV: 95 fL (ref 79–97)
Monocytes Absolute: 0.9 10*3/uL (ref 0.1–0.9)
Monocytes: 10 %
NRBC: 23 % — ABNORMAL HIGH (ref 0–0)
Neutrophils Absolute: 5.5 10*3/uL (ref 1.4–7.0)
Neutrophils: 59 %
Platelets: 792 10*3/uL — ABNORMAL HIGH (ref 150–450)
RBC: 2.63 x10E6/uL — CL (ref 4.14–5.80)
RDW: 19.8 % — ABNORMAL HIGH (ref 11.6–15.4)
WBC: 9.4 10*3/uL (ref 3.4–10.8)

## 2018-11-14 LAB — FERRITIN: Ferritin: 3355 ng/mL — ABNORMAL HIGH (ref 30–400)

## 2018-11-18 LAB — DRUG SCREEN 764883 11+OXYCO+ALC+CRT-BUND
Amphetamines, Urine: NEGATIVE ng/mL
BENZODIAZ UR QL: NEGATIVE ng/mL
Barbiturate: NEGATIVE ng/mL
Cocaine (Metabolite): NEGATIVE ng/mL
Creatinine: 84.1 mg/dL (ref 20.0–300.0)
Ethanol: NEGATIVE %
Meperidine: NEGATIVE ng/mL
Methadone Screen, Urine: NEGATIVE ng/mL
OPIATE SCREEN URINE: NEGATIVE ng/mL
Phencyclidine: NEGATIVE ng/mL
Propoxyphene: NEGATIVE ng/mL
Tramadol: NEGATIVE ng/mL
pH, Urine: 8.2 (ref 4.5–8.9)

## 2018-11-18 LAB — CANNABINOID CONFIRMATION, UR
CANNABINOIDS: POSITIVE — AB
Carboxy THC GC/MS Conf: 219 ng/mL

## 2018-11-18 LAB — OXYCODONE/OXYMORPHONE, CONFIRM: OXYCODONE/OXYMORPH: NEGATIVE

## 2018-11-19 ENCOUNTER — Telehealth: Payer: Self-pay | Admitting: Internal Medicine

## 2018-11-19 ENCOUNTER — Other Ambulatory Visit: Payer: Self-pay | Admitting: Internal Medicine

## 2018-11-20 ENCOUNTER — Other Ambulatory Visit: Payer: Self-pay | Admitting: Internal Medicine

## 2018-11-21 ENCOUNTER — Other Ambulatory Visit: Payer: Self-pay | Admitting: Internal Medicine

## 2018-11-23 ENCOUNTER — Other Ambulatory Visit: Payer: Self-pay | Admitting: Family Medicine

## 2018-11-23 DIAGNOSIS — D571 Sickle-cell disease without crisis: Secondary | ICD-10-CM

## 2018-11-23 MED ORDER — OXYCODONE HCL ER 20 MG PO T12A: 20.0000 mg | EXTENDED_RELEASE_TABLET | Freq: Two times a day (BID) | ORAL | 0 refills | Status: DC

## 2018-11-23 MED ORDER — OXYCODONE-ACETAMINOPHEN 10-325 MG PO TABS: 1.0000 | ORAL_TABLET | Freq: Four times a day (QID) | ORAL | 0 refills | Status: DC | PRN

## 2018-11-23 NOTE — Telephone Encounter (Signed)
Called and spoke with patient, advised that rx has been sent to pharmacy. Thanks!  

## 2018-11-24 ENCOUNTER — Encounter (HOSPITAL_COMMUNITY): Payer: Self-pay | Admitting: *Deleted

## 2018-11-24 ENCOUNTER — Other Ambulatory Visit: Payer: Self-pay

## 2018-11-24 ENCOUNTER — Emergency Department (HOSPITAL_COMMUNITY)
Admission: EM | Admit: 2018-11-24 | Discharge: 2018-11-25 | Disposition: A | Discharge status: Home / Self Care | Payer: Medicaid Other | Attending: Emergency Medicine | Admitting: Emergency Medicine

## 2018-11-24 DIAGNOSIS — M545 Low back pain: Secondary | ICD-10-CM | POA: Diagnosis present

## 2018-11-24 DIAGNOSIS — Z7982 Long term (current) use of aspirin: Secondary | ICD-10-CM | POA: Diagnosis not present

## 2018-11-24 DIAGNOSIS — D571 Sickle-cell disease without crisis: Secondary | ICD-10-CM | POA: Insufficient documentation

## 2018-11-24 DIAGNOSIS — D5700 Hb-SS disease with crisis, unspecified: Secondary | ICD-10-CM

## 2018-11-24 MED ORDER — SODIUM CHLORIDE 0.9% FLUSH
3.0000 mL | Freq: Once | INTRAVENOUS | Status: AC
  Administered 2018-11-25: 3 mL via INTRAVENOUS

## 2018-11-24 NOTE — ED Triage Notes (Signed)
Back pain for several hours, consistent with his sickle cell pain. Took tylenol for pain.

## 2018-11-24 NOTE — ED Notes (Signed)
Pt wants to wait for IV start for blood draw.

## 2018-11-25 ENCOUNTER — Other Ambulatory Visit: Payer: Self-pay

## 2018-11-25 LAB — CBC WITH DIFFERENTIAL/PLATELET
Abs Immature Granulocytes: 0.02 10*3/uL (ref 0.00–0.07)
Basophils Absolute: 0.1 10*3/uL (ref 0.0–0.1)
Basophils Relative: 1 %
Eosinophils Absolute: 0.2 10*3/uL (ref 0.0–0.5)
Eosinophils Relative: 2 %
HCT: 34.9 % — ABNORMAL LOW (ref 39.0–52.0)
Hemoglobin: 11.5 g/dL — ABNORMAL LOW (ref 13.0–17.0)
Immature Granulocytes: 0 %
Lymphocytes Relative: 37 %
Lymphs Abs: 3.4 10*3/uL (ref 0.7–4.0)
MCH: 29.4 pg (ref 26.0–34.0)
MCHC: 33 g/dL (ref 30.0–36.0)
MCV: 89.3 fL (ref 80.0–100.0)
Monocytes Absolute: 1.2 10*3/uL — ABNORMAL HIGH (ref 0.1–1.0)
Monocytes Relative: 13 %
Neutro Abs: 4.5 10*3/uL (ref 1.7–7.7)
Neutrophils Relative %: 47 %
Platelets: 489 10*3/uL — ABNORMAL HIGH (ref 150–400)
RBC: 3.91 MIL/uL — ABNORMAL LOW (ref 4.22–5.81)
RDW: 16.5 % — ABNORMAL HIGH (ref 11.5–15.5)
WBC: 9.4 10*3/uL (ref 4.0–10.5)
nRBC: 3 % — ABNORMAL HIGH (ref 0.0–0.2)

## 2018-11-25 LAB — RETICULOCYTES
Immature Retic Fract: 46.8 % — ABNORMAL HIGH (ref 2.3–15.9)
RBC.: 3.91 MIL/uL — ABNORMAL LOW (ref 4.22–5.81)
Retic Count, Absolute: 264.7 10*3/uL — ABNORMAL HIGH (ref 19.0–186.0)
Retic Ct Pct: 6.8 % — ABNORMAL HIGH (ref 0.4–3.1)

## 2018-11-25 LAB — COMPREHENSIVE METABOLIC PANEL
ALT: 24 U/L (ref 0–44)
AST: 45 U/L — ABNORMAL HIGH (ref 15–41)
Albumin: 4.5 g/dL (ref 3.5–5.0)
Alkaline Phosphatase: 76 U/L (ref 38–126)
Anion gap: 9 (ref 5–15)
BUN: 7 mg/dL (ref 6–20)
CO2: 27 mmol/L (ref 22–32)
Calcium: 9.2 mg/dL (ref 8.9–10.3)
Chloride: 100 mmol/L (ref 98–111)
Creatinine, Ser: 0.59 mg/dL — ABNORMAL LOW (ref 0.61–1.24)
GFR calc Af Amer: >60 mL/min (ref >60)
GFR calc non Af Amer: >60 mL/min (ref >60)
Glucose, Bld: 91 mg/dL (ref 70–99)
Potassium: 4.7 mmol/L (ref 3.5–5.1)
Sodium: 136 mmol/L (ref 135–145)
Total Bilirubin: 1.1 mg/dL (ref 0.3–1.2)
Total Protein: 8 g/dL (ref 6.5–8.1)

## 2018-11-25 MED ORDER — IBUPROFEN 800 MG PO TABS
800.0000 mg | ORAL_TABLET | Freq: Once | ORAL | Status: AC
  Administered 2018-11-25: 800 mg via ORAL
  Filled 2018-11-25: qty 1

## 2018-11-25 MED ORDER — OXYCODONE-ACETAMINOPHEN 5-325 MG PO TABS
2.0000 | ORAL_TABLET | Freq: Once | ORAL | Status: AC
  Administered 2018-11-25: 02:00:00 2 via ORAL
  Filled 2018-11-25: qty 2

## 2018-11-25 MED ORDER — HYDROMORPHONE HCL 1 MG/ML IJ SOLN
0.5000 mg | Freq: Once | INTRAMUSCULAR | Status: AC
  Administered 2018-11-25: 02:00:00 0.5 mg via INTRAVENOUS
  Filled 2018-11-25: qty 1

## 2018-11-25 NOTE — ED Provider Notes (Signed)
[facility] DEPT Provider Note  CSN: [id number] Arrival date & time: 11/24/18 2244  Chief Complaint(s) Sickle Cell Pain Crisis  HPI Timothy Marks is a 28 y.o. male with a history of sickle cell disease and chronic pain who presents to the emergency department with several hours of lower back pain typical for his sickle cell pain.  He reports that he ran out of his home pain medicine a week ago.  Denies any GI, urinary symptoms.  Pain does not radiate.  He denies any lower extremity weakness or loss of sensation.  No trauma.  No fevers or chills.  No other physical complaints.  HPI  Past Medical History Past Medical History:  Diagnosis Date  . Depression   . 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 Sickle cell anemia (HCC)   . Vitamin D deficiency y-6   Patient Active Problem List   Diagnosis Date Noted  . Abnormal liver function 11/04/2018  . Sickle cell anemia with crisis ([place]) 05/22/2018  . Acute bilateral low back pain without sciatica   . Other chronic pain   . Anemia of chronic disease   . 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 Physical deconditioning   . Agitation   . Chronic pain syndrome   . Recurrent cold sores   . Fever   . Pain   . Sickle cell crisis ([place]) 07/16/2017  . Adjustment disorder with mixed disturbance of emotions and conduct   . Constipation 08/03/2014  . 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 Protein-calorie malnutrition, severe ([place]) 04/09/2014  . Major depressive disorder, recurrent, severe without psychotic features ([place])   . 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 Vitamin D deficiency   . Sickle cell anemia ([place]) 03/30/2014  . Hyperbilirubinemia 03/30/2014  . Hypokalemia 02/07/2014  . Aggressive behavior 01/25/2013  . Sickle cell anemia with pain ([place]) 01/24/2013  . Sickle cell pain crisis ([place]) 01/24/2013   Home Medication(s) Prior to  Admission medications   Medication Sig Start Date End Date Taking? Authorizing Provider  folic acid (FOLVITE) 1 MG tablet Take 1 tablet (1 mg total) by mouth daily. 05/01/17  Yes Dorena Dew, FNP  gabapentin (NEURONTIN) 300 MG capsule TAKE 1 CAPSULE(300 MG) BY MOUTH THREE TIMES DAILY Patient taking differently: Take 300 mg by mouth 3 (three) times daily.  05/08/18  Yes Lanae Boast, FNP  hydroxyurea (HYDREA) 500 MG capsule Take 1 capsule (500 mg total) by mouth 2 (two) times daily. May take with food to minimize GI side effects. 05/01/17  Yes Dorena Dew, FNP  Nutritional Supplements (ENSURE ACTIVE HIGH PROTEIN) LIQD Take 1 each by mouth 3 (three) times daily between meals. #90 cans of Ensure 11/13/18  Yes Dorena Dew, FNP  oxyCODONE (OXYCONTIN) 20 mg 12 hr tablet Take 1 tablet (20 mg total) by mouth every 12 (twelve) hours. 11/23/18 12/25/18 Yes Azzie Glatter, FNP  oxyCODONE-acetaminophen (PERCOCET) 10-325 MG tablet Take 1 tablet by mouth every 6 (six) hours as needed for up to 15 days for pain. 11/26/18 12/11/18 Yes Azzie Glatter, FNP  aspirin EC 81 MG tablet Take 1 tablet (81 mg total) by mouth daily. Patient not taking: Reported on 10/11/2018 06/02/17   Dorena Dew, FNP  cholecalciferol (VITAMIN D) 1000 units tablet Take 2 tablets (2,000 Units total) by mouth daily. 12/13/17   Azzie Glatter, FNP  clindamycin (CLEOCIN) 300 MG capsule Take 1 capsule (  300 mg total) by mouth 4 (four) times daily. X 7 days 10/29/18   Veryl Speak, MD  ibuprofen (ADVIL,MOTRIN) 800 MG tablet Take 1 tablet (800 mg total) by mouth 3 (three) times daily as needed (pain). 12/20/17   Azzie Glatter, FNP                                                                                                                                    Past Surgical History Past Surgical History:  Procedure Laterality Date  . CHOLECYSTECTOMY     Family History Family History  Problem Relation Age of Onset   . Sickle cell trait Mother   . Sickle cell trait Father     Social History Social History   Tobacco Use  . Smoking status: Never Smoker  . Smokeless tobacco: Never Used  Substance Use Topics  . Alcohol use: No  . Drug use: Not Currently    Types: Marijuana   Allergies Buprenorphine hcl, Levaquin [levofloxacin], Meperidine, Morphine and related, Toradol [ketorolac tromethamine], Tramadol, Vancomycin, and Zosyn [piperacillin sod-tazobactam so]  Review of Systems Review of Systems All other systems are reviewed and are negative for acute change except as noted in the HPI  Physical Exam Vital Signs  I have reviewed the triage vital signs BP 116/69   Pulse 76   Temp 98.7 F (37.1 C) (Oral)   Resp (!) 21   Ht 6\' 2"  (1.88 m)   Wt 61.2 kg   SpO2 96%   BMI 17.33 kg/m   Physical Exam Vitals signs reviewed.  Constitutional:      General: He is not in acute distress.    Appearance: He is well-developed. He is not diaphoretic.  HENT:     Head: Normocephalic and atraumatic.     Jaw: No trismus.     Right Ear: External ear normal.     Left Ear: External ear normal.     Nose: Nose normal.  Eyes:     General: No scleral icterus.    Conjunctiva/sclera: Conjunctivae normal.  Neck:     Musculoskeletal: Normal range of motion.     Trachea: Phonation normal.  Cardiovascular:     Rate and Rhythm: Normal rate and regular rhythm.  Pulmonary:     Effort: Pulmonary effort is normal. No respiratory distress.     Breath sounds: No stridor.  Abdominal:     General: There is no distension.  Musculoskeletal: Normal range of motion.     Lumbar back: He exhibits tenderness.       Back:  Neurological:     Mental Status: He is alert and oriented to person, place, and time.  Psychiatric:        Behavior: Behavior normal.     ED Results and Treatments Labs (all labs ordered are listed, but only abnormal results are displayed) Labs Reviewed  COMPREHENSIVE METABOLIC PANEL -  Abnormal; Notable for the following  components:      Result Value   Creatinine, Ser 0.59 (*)    AST 45 (*)    All other components within normal limits  CBC WITH DIFFERENTIAL/PLATELET - Abnormal; Notable for the following components:   RBC 3.91 (*)    Hemoglobin 11.5 (*)    HCT 34.9 (*)    RDW 16.5 (*)    Platelets 489 (*)    nRBC 3.0 (*)    Monocytes Absolute 1.2 (*)    All other components within normal limits  RETICULOCYTES - Abnormal; Notable for the following components:   Retic Ct Pct 6.8 (*)    RBC. 3.91 (*)    Retic Count, Absolute 264.7 (*)    Immature Retic Fract 46.8 (*)    All other components within normal limits                                                                                                                         EKG  EKG Interpretation  Date/Time:    Ventricular Rate:    PR Interval:    QRS Duration:   QT Interval:    QTC Calculation:   R Axis:     Text Interpretation:        Radiology No results found.  Pertinent labs & imaging results that were available during my care of the patient were reviewed by me and considered in my medical decision making (see chart for details).  Medications Ordered in ED Medications  sodium chloride flush (NS) 0.9 % injection 3 mL (3 mLs Intravenous Given 11/25/18 0023)  oxyCODONE-acetaminophen (PERCOCET/ROXICET) 5-325 MG per tablet 2 tablet (2 tablets Oral Given 11/25/18 0148)  ibuprofen (ADVIL) tablet 800 mg (800 mg Oral Given 11/25/18 0147)  HYDROmorphone (DILAUDID) injection 0.5 mg (0.5 mg Intravenous Given 11/25/18 0147)                                                                                                                                    Procedures Procedures  (including critical care time)  Medical Decision Making / ED Course I have reviewed the nursing notes for this encounter and the patient's prior records (if available in EHR or on provided paperwork).   Timothy Marks was  evaluated in Emergency Department on 11/25/2018 for the symptoms described in the history of present illness. He was evaluated in the context of the global COVID-19 pandemic,  which necessitated consideration that the patient might be at risk for infection with the SARS-CoV-2 virus that causes COVID-19. Institutional protocols and algorithms that pertain to the evaluation of patients at risk for COVID-19 are in a state of rapid change based on information released by regulatory bodies including the CDC and federal and state organizations. These policies and algorithms were followed during the patient's care in the ED.  Patient presents with lower back pain typical of his sickle cell pain.  Upon record review patient had prescriptions for OxyContin which should have lasted him for another 2 days.  He also had Percocet prescription which should have lasted another 2 to 3 days.  It appears that the patient is overusing his home medication.  He does not appear to be in any acute distress.  Screening labs obtained and grossly reassuring without evidence of hemolytic or aplastic crisis.  Given a single dose of Dilaudid with his home pain medicine.  The patient appears reasonably screened and/or stabilized for discharge and I doubt any other medical condition or other [facility] requiring further screening, evaluation, or treatment in the ED at this time prior to discharge.  The patient is safe for discharge with strict return precautions.       Final Clinical Impression(s) / ED Diagnoses Final diagnoses:  Sickle cell anemia with pain ([place])    The patient appears reasonably screened and/or stabilized for discharge and I doubt any other medical condition or other [facility] requiring further screening, evaluation, or treatment in the ED at this time prior to discharge.  Disposition: Discharge  Condition: Good  I have discussed the results, Dx and Tx plan with the patient who expressed understanding and  agree(s) with the plan. Discharge instructions discussed at great length. The patient was given strict return precautions who verbalized understanding of the instructions. No further questions at time of discharge.    ED Discharge Orders    None      Follow Up: Lanae Boast, FNP [place] [address] [phone]  Schedule an appointment as soon as possible for a visit       This chart was dictated using voice recognition software.  Despite best efforts to proofread,  errors can occur which can change the documentation meaning.   Fatima Blank, MD 11/25/18 [phone]

## 2018-12-10 ENCOUNTER — Telehealth: Payer: Self-pay | Admitting: Internal Medicine

## 2018-12-10 NOTE — Telephone Encounter (Signed)
10-325 mg oxycodone

## 2018-12-11 ENCOUNTER — Telehealth: Payer: Self-pay | Admitting: Internal Medicine

## 2018-12-11 ENCOUNTER — Other Ambulatory Visit: Payer: Self-pay | Admitting: Internal Medicine

## 2018-12-11 ENCOUNTER — Other Ambulatory Visit: Payer: Self-pay | Admitting: Family Medicine

## 2018-12-11 ENCOUNTER — Telehealth: Payer: Self-pay | Admitting: Family Medicine

## 2018-12-11 DIAGNOSIS — D571 Sickle-cell disease without crisis: Secondary | ICD-10-CM

## 2018-12-11 MED ORDER — OXYCODONE-ACETAMINOPHEN 10-325 MG PO TABS: 1.0000 | ORAL_TABLET | Freq: Four times a day (QID) | ORAL | 0 refills | Status: DC | PRN

## 2018-12-11 NOTE — Telephone Encounter (Signed)
Refilled

## 2018-12-11 NOTE — Telephone Encounter (Signed)
Done

## 2018-12-11 NOTE — Telephone Encounter (Signed)
done

## 2018-12-11 NOTE — Progress Notes (Signed)
Meds ordered this encounter  Medications  . oxyCODONE-acetaminophen (PERCOCET) 10-325 MG tablet    Sig: Take 1 tablet by mouth every 6 (six) hours as needed for up to 15 days for pain.    Dispense:  60 tablet    Refill:  0    Please do not refill this medication prior to 11/26/2018. Thank you.    Order Specific Question:   Supervising Provider    Answer:   Tresa Garter [id number]   Reviewed PDMP prior to prescribing, no inconsistencies noted.   [name]  APRN, MSN, FNP-C [facility] [address] [place], [place] [address] [phone]

## 2018-12-12 ENCOUNTER — Encounter (HOSPITAL_COMMUNITY): Payer: Self-pay | Admitting: General Practice

## 2018-12-12 ENCOUNTER — Telehealth (HOSPITAL_COMMUNITY): Payer: Self-pay

## 2018-12-12 ENCOUNTER — Non-Acute Institutional Stay (HOSPITAL_COMMUNITY)
Admission: AD | Admit: 2018-12-12 | Discharge: 2018-12-12 | Disposition: A | Discharge status: Home / Self Care | Payer: Medicaid Other | Source: Ambulatory Visit | Attending: Internal Medicine | Admitting: Internal Medicine

## 2018-12-12 DIAGNOSIS — G894 Chronic pain syndrome: Secondary | ICD-10-CM | POA: Diagnosis not present

## 2018-12-12 DIAGNOSIS — F1120 Opioid dependence, uncomplicated: Secondary | ICD-10-CM | POA: Insufficient documentation

## 2018-12-12 DIAGNOSIS — D5700 Hb-SS disease with crisis, unspecified: Secondary | ICD-10-CM | POA: Diagnosis present

## 2018-12-12 DIAGNOSIS — D638 Anemia in other chronic diseases classified elsewhere: Secondary | ICD-10-CM | POA: Diagnosis not present

## 2018-12-12 DIAGNOSIS — Z79899 Other long term (current) drug therapy: Secondary | ICD-10-CM | POA: Diagnosis not present

## 2018-12-12 DIAGNOSIS — Z7982 Long term (current) use of aspirin: Secondary | ICD-10-CM | POA: Insufficient documentation

## 2018-12-12 LAB — CBC WITH DIFFERENTIAL/PLATELET
Abs Immature Granulocytes: 0.02 10*3/uL (ref 0.00–0.07)
Basophils Absolute: 0 10*3/uL (ref 0.0–0.1)
Basophils Relative: 0 %
Eosinophils Absolute: 0.2 10*3/uL (ref 0.0–0.5)
Eosinophils Relative: 2 %
HCT: 33.3 % — ABNORMAL LOW (ref 39.0–52.0)
Hemoglobin: 11.4 g/dL — ABNORMAL LOW (ref 13.0–17.0)
Immature Granulocytes: 0 %
Lymphocytes Relative: 24 %
Lymphs Abs: 2.5 10*3/uL (ref 0.7–4.0)
MCH: 28.6 pg (ref 26.0–34.0)
MCHC: 34.2 g/dL (ref 30.0–36.0)
MCV: 83.7 fL (ref 80.0–100.0)
Monocytes Absolute: 1 10*3/uL (ref 0.1–1.0)
Monocytes Relative: 10 %
Neutro Abs: 6.8 10*3/uL (ref 1.7–7.7)
Neutrophils Relative %: 64 %
Platelets: 408 10*3/uL — ABNORMAL HIGH (ref 150–400)
RBC: 3.98 MIL/uL — ABNORMAL LOW (ref 4.22–5.81)
RDW: 18.1 % — ABNORMAL HIGH (ref 11.5–15.5)
WBC: 10.6 10*3/uL — ABNORMAL HIGH (ref 4.0–10.5)
nRBC: 0.3 % — ABNORMAL HIGH (ref 0.0–0.2)

## 2018-12-12 LAB — RAPID URINE DRUG SCREEN, HOSP PERFORMED
Amphetamines: NOT DETECTED
Barbiturates: NOT DETECTED
Benzodiazepines: NOT DETECTED
Cocaine: NOT DETECTED
Opiates: POSITIVE — AB
Tetrahydrocannabinol: POSITIVE — AB

## 2018-12-12 LAB — COMPREHENSIVE METABOLIC PANEL
ALT: 21 U/L (ref 0–44)
AST: 28 U/L (ref 15–41)
Albumin: 4.5 g/dL (ref 3.5–5.0)
Alkaline Phosphatase: 76 U/L (ref 38–126)
Anion gap: 9 (ref 5–15)
BUN: 8 mg/dL (ref 6–20)
CO2: 25 mmol/L (ref 22–32)
Calcium: 9.1 mg/dL (ref 8.9–10.3)
Chloride: 103 mmol/L (ref 98–111)
Creatinine, Ser: 0.62 mg/dL (ref 0.61–1.24)
GFR calc Af Amer: >60 mL/min (ref >60)
GFR calc non Af Amer: >60 mL/min (ref >60)
Glucose, Bld: 101 mg/dL — ABNORMAL HIGH (ref 70–99)
Potassium: 3.3 mmol/L — ABNORMAL LOW (ref 3.5–5.1)
Sodium: 137 mmol/L (ref 135–145)
Total Bilirubin: 3.2 mg/dL — ABNORMAL HIGH (ref 0.3–1.2)
Total Protein: 8.2 g/dL — ABNORMAL HIGH (ref 6.5–8.1)

## 2018-12-12 LAB — RETICULOCYTES
Immature Retic Fract: 26.3 % — ABNORMAL HIGH (ref 2.3–15.9)
RBC.: 3.98 MIL/uL — ABNORMAL LOW (ref 4.22–5.81)
Retic Count, Absolute: 136.5 10*3/uL (ref 19.0–186.0)
Retic Ct Pct: 3.4 % — ABNORMAL HIGH (ref 0.4–3.1)

## 2018-12-12 MED ORDER — HYDROMORPHONE 1 MG/ML IV SOLN
INTRAVENOUS | Status: DC
  Administered 2018-12-12: 30 mg via INTRAVENOUS
  Administered 2018-12-12: 8 mg via INTRAVENOUS
  Filled 2018-12-12: qty 30

## 2018-12-12 MED ORDER — ONDANSETRON HCL 4 MG/2ML IJ SOLN
4.0000 mg | Freq: Four times a day (QID) | INTRAMUSCULAR | Status: DC | PRN
  Administered 2018-12-12: 4 mg via INTRAVENOUS
  Filled 2018-12-12: qty 2

## 2018-12-12 MED ORDER — DIPHENHYDRAMINE HCL 25 MG PO CAPS: 25.0000 mg | ORAL_CAPSULE | ORAL | Status: DC | PRN

## 2018-12-12 MED ORDER — HYDROMORPHONE HCL 2 MG/ML IJ SOLN
2.0000 mg | Freq: Once | INTRAMUSCULAR | Status: AC
  Administered 2018-12-12: 2 mg via SUBCUTANEOUS
  Filled 2018-12-12: qty 1

## 2018-12-12 MED ORDER — SODIUM CHLORIDE 0.45 % IV SOLN
INTRAVENOUS | Status: DC
  Administered 2018-12-12: 12:00:00 via INTRAVENOUS

## 2018-12-12 MED ORDER — SODIUM CHLORIDE 0.9% FLUSH: 9.0000 mL | INTRAVENOUS | Status: DC | PRN

## 2018-12-12 MED ORDER — SODIUM CHLORIDE 0.9 % IV SOLN
25.0000 mg | INTRAVENOUS | Status: DC | PRN
  Filled 2018-12-12: qty 0.5

## 2018-12-12 MED ORDER — ACETAMINOPHEN 500 MG PO TABS
1000.0000 mg | ORAL_TABLET | Freq: Once | ORAL | Status: AC
  Administered 2018-12-12: 1000 mg via ORAL
  Filled 2018-12-12: qty 2

## 2018-12-12 MED ORDER — NALOXONE HCL 0.4 MG/ML IJ SOLN: 0.4000 mg | INTRAMUSCULAR | Status: DC | PRN

## 2018-12-12 NOTE — Discharge Instructions (Signed)
Sickle Cell Anemia, Adult ° °Sickle cell anemia is a condition where your red blood cells are shaped like sickles. Red blood cells carry oxygen through the body. Sickle-shaped cells do not live as long as normal red blood cells. They also clump together and block blood from flowing through the blood vessels. This prevents the body from getting enough oxygen. Sickle cell anemia causes organ damage and pain. It also increases the risk of infection. °Follow these instructions at home: °Medicines °· Take over-the-counter and prescription medicines only as told by your doctor. °· If you were prescribed an antibiotic medicine, take it as told by your doctor. Do not stop taking the antibiotic even if you start to feel better. °· If you develop a fever, do not take medicines to lower the fever right away. Tell your doctor about the fever. °Managing pain, stiffness, and swelling °· Try these methods to help with pain: °? Use a heating pad. °? Take a warm bath. °? Distract yourself, such as by watching TV. °Eating and drinking °· Drink enough fluid to keep your pee (urine) clear or pale yellow. Drink more in hot weather and during exercise. °· Limit or avoid alcohol. °· Eat a healthy diet. Eat plenty of fruits, vegetables, whole grains, and lean protein. °· Take vitamins and supplements as told by your doctor. °Traveling °· When traveling, keep these with you: °? Your medical information. °? The names of your doctors. °? Your medicines. °· If you need to take an airplane, talk to your doctor first. °Activity °· Rest often. °· Avoid exercises that make your heart beat much faster, such as jogging. °General instructions °· Do not use products that have nicotine or tobacco, such as cigarettes and e-cigarettes. If you need help quitting, ask your doctor. °· Consider wearing a medical alert bracelet. °· Avoid being in high places (high altitudes), such as mountains. °· Avoid very hot or cold temperatures. °· Avoid places where the  temperature changes a lot. °· Keep all follow-up visits as told by your doctor. This is important. °Contact a doctor if: °· A joint hurts. °· Your feet or hands hurt or swell. °· You feel tired (fatigued). °Get help right away if: °· You have symptoms of infection. These include: °? Fever. °? Chills. °? Being very tired. °? Irritability. °? Poor eating. °? Throwing up (vomiting). °· You feel dizzy or faint. °· You have new stomach pain, especially on the left side. °· You have a an erection (priapism) that lasts more than 4 hours. °· You have numbness in your arms or legs. °· You have a hard time moving your arms or legs. °· You have trouble talking. °· You have pain that does not go away when you take medicine. °· You are short of breath. °· You are breathing fast. °· You have a long-term cough. °· You have pain in your chest. °· You have a bad headache. °· You have a stiff neck. °· Your stomach looks bloated even though you did not eat much. °· Your skin is pale. °· You suddenly cannot see well. °Summary °· Sickle cell anemia is a condition where your red blood cells are shaped like sickles. °· Follow your doctor's advice on ways to manage pain, food to eat, activities to do, and steps to take for safe travel. °· Get medical help right away if you have any signs of infection, such as a fever. °This information is not intended to replace advice given to you by   your health care provider. Make sure you discuss any questions you have with your health care provider. °Document Released: 11/21/2012 Document Revised: 05/25/2018 Document Reviewed: 03/08/2016 °Elsevier Patient Education © 2020 [employer] ° °

## 2018-12-12 NOTE — Telephone Encounter (Signed)
Patient called and reported sickle cell pain to legs and back.  Report pain to be 8/10. Last medication taken was Tylenol last evening. Patient did not report fever, chest pain, nausea, vomiting, diarrhea, abdominal pain, priapism or COVID symptoms.  Chart reviewed with provider and advise to come in for treatment.

## 2018-12-12 NOTE — Progress Notes (Deleted)
Patient admitted to the day hospital for treatment of sickle cell pain crisis. Patient reported pain rated 8/10 in the back and left leg. Patient placed on Dilaudid PCA, given sub Q dilaudid, IV Zofran and hydrated with IV fluids. At discharge patient reported  pain at 6/10. Discharge instructions given to patient. Patient alert, oriented and ambulatory at discharge.

## 2018-12-12 NOTE — Progress Notes (Signed)
Patient admitted to the day hospital for treatment of sickle cell pain crisis. Patient reported back and legs pain rated 8/10. Initially, patient received Dilaudid subq. Once PIV  Maintained, patient was placed on a Dilaudid PCA, given PO Tylenol and hydrated with IV fluids. At discharge patient reported  pain at 6/10. Discharge instructions given to patient. Patient alert, oriented and ambulatory at discharge.

## 2018-12-12 NOTE — Discharge Summary (Signed)
[facility] Discharge Summary   Patient ID: Timothy Marks MRN: [id number] DOB/AGE: 1990-05-09 28 y.o.  Admit date: 12/12/2018 Discharge date: 12/12/2018  Primary Care Physician:  Tresa Garter, MD  Admission Diagnoses:  Active Problems:   Sickle cell pain crisis [facility])   Discharge Medications:  Allergies as of 12/12/2018      Reactions   Buprenorphine Hcl Hives   Levaquin [levofloxacin] Itching   Meperidine Rash   Morphine And Related Hives   Toradol [ketorolac Tromethamine] Itching   Tramadol Hives   Vancomycin Itching   Zosyn [piperacillin Sod-tazobactam So] Itching, Rash   Has taken rocephin in past      Medication List    TAKE these medications   aspirin EC 81 MG tablet Take 1 tablet (81 mg total) by mouth daily.   cholecalciferol 25 MCG (1000 UT) tablet Commonly known as: VITAMIN D Take 2 tablets (2,000 Units total) by mouth daily.   clindamycin 300 MG capsule Commonly known as: CLEOCIN Take 1 capsule (300 mg total) by mouth 4 (four) times daily. X 7 days   Ensure Active High Protein Liqd Take 1 each by mouth 3 (three) times daily between meals. #90 cans of Ensure   folic acid 1 MG tablet Commonly known as: FOLVITE Take 1 tablet (1 mg total) by mouth daily.   gabapentin 300 MG capsule Commonly known as: NEURONTIN TAKE 1 CAPSULE(300 MG) BY MOUTH THREE TIMES DAILY What changed: See the new instructions.   hydroxyurea 500 MG capsule Commonly known as: HYDREA Take 1 capsule (500 mg total) by mouth 2 (two) times daily. May take with food to minimize GI side effects.   ibuprofen 800 MG tablet Commonly known as: ADVIL Take 1 tablet (800 mg total) by mouth 3 (three) times daily as needed (pain).   oxyCODONE 20 mg 12 hr tablet Commonly known as: OXYCONTIN Take 1 tablet (20 mg total) by mouth every 12 (twelve) hours.   oxyCODONE-acetaminophen 10-325 MG tablet Commonly known as: PERCOCET Take 1 tablet by mouth every 6 (six) hours  as needed for up to 15 days for pain.        Consults:  None  Significant Diagnostic Studies:  No results found. History of present illness: Timothy Marks, a 28 year old male with a medical history significant for sickle cell disease, chronic pain syndrome, opiate dependence and tolerance, history of anemia of chronic disease, history of oppositional defiance, and history of osteonecrosis of left shoulder presents complaining of pain primarily to low back and left lower extremity.  Patient attributes current pain crisis to being out of opiate medications, and sudden weather changes.  Pain intensity is 8/10, constant, and aching.  Patient last had Tylenol last night without sustained relief.  Patient denies sick contacts, recent travel, or exposure to COVID-19.  He also denies blurred vision, dizziness, paresthesias, dysuria, nausea, vomiting, or diarrhea.  Also, patient denies shortness of breath, chest pain, persistent cough, sore throat, fever, or chills.  [facility] Course: Patient admitted to sickle cell day infusion center for management of pain crisis. All laboratory values reviewed. Hemoglobin 11.4, consistent with patient's baseline.  No clinical indication for blood transfusion at this time. All other labs reassuring. Pain managed with IV Dilaudid via PCA.  Settings of 0.5 mg, 10-minute lockout, and 3 mg/h IV Toradol 15 mg x 1 Tylenol 1000 mg by mouth x1 Patient has poor venous access, Dilaudid 2 mg SQ x1 prior to obtaining IV access. IV fluids, 0.45% saline  at 100 mL/h Pain intensity decreased to 6/10.  Patient states that he can manage at home on current medication regimen.  He does not warrant admission at this time. Patient alert, oriented, and ambulating without assistance. Patient will discharge home in a hemodynamically stable condition.  Discharge instructions: Resume all home medications.  Follow up with PCP as previously  scheduled.   Discussed the  importance of drinking 64 ounces of water daily to  help prevent pain crises, it is important to drink plenty of water throughout the day. This is because dehydration of red blood cells may lead further sickling.   Avoid all stressors that precipitate sickle cell pain crisis.     The patient was given clear instructions to go to ER or return to medical center if symptoms do not improve, worsen or new problems develop.      Physical Exam at Discharge:  BP (!) 105/56 (BP Location: Left Arm)   Pulse 61   Temp 98.4 F (36.9 C) (Oral)   Resp 12   SpO2 97%  Physical Exam Constitutional:      Appearance: He is normal weight.  HENT:     Head: Normocephalic.     Mouth/Throat:     Mouth: Mucous membranes are moist.  Eyes:     Pupils: Pupils are equal, round, and reactive to light.  Neck:     Musculoskeletal: Normal range of motion.  Cardiovascular:     Rate and Rhythm: Normal rate and regular rhythm.     Pulses: Normal pulses.  Pulmonary:     Effort: Pulmonary effort is normal.     Breath sounds: Normal breath sounds.  Abdominal:     General: Abdomen is flat. Bowel sounds are normal.  Musculoskeletal: Normal range of motion.  Skin:    General: Skin is warm.  Neurological:     General: No focal deficit present.     Mental Status: He is alert. Mental status is at baseline.  Psychiatric:        Mood and Affect: Mood normal.        Behavior: Behavior normal.        Thought Content: Thought content normal.        Judgment: Judgment normal.      Disposition at Discharge: Discharge disposition: 01-Home or Self Care       Discharge Orders: Discharge Instructions    Discharge patient   Complete by: As directed    Discharge disposition: 01-Home or Self Care   Discharge patient date: 12/12/2018      Condition at Discharge:   Stable  Time spent on Discharge:  Greater than 30 minutes.  Signed:  [name]  APRN, MSN, FNP-C [facility] [address] [place], [place] [address] [phone]  12/12/2018, 2:41 PM

## 2018-12-12 NOTE — H&P (Signed)
[facility] History and Physical   Date: 12/12/2018  Patient name: Timothy Marks Medical record number: [id number] Date of birth: 1990-10-26 Age: 28 y.o. Gender: male PCP: Tresa Garter, MD  Attending physician: Tresa Garter, MD  Chief Complaint: Sickle cell pain  History of Present Illness: Jobe Criste, a 28 year old male with a medical history significant for sickle cell disease, chronic pain syndrome, opiate dependence and tolerance, history of anemia of chronic disease, history of oppositional defiance, and history of osteonecrosis of left shoulder presents complaining of pain primarily to low back and left lower extremity.  Patient attributes current crisis to being out of opiate medication and sudden weather changes.  Pain intensity is 8/10, constant, aching.  Patient last had Tylenol on last night without sustained relief.  Patient denies sick contacts, recent travel, or exposure to COVID-19.  He also denies blurred vision, dizziness, paresthesias, dysuria, nausea, vomiting, or diarrhea.  Also denies shortness of breath, chest pain, persistent cough, sore throat, fever, or chills.  Meds: Medications Prior to Admission  Medication Sig Dispense Refill Last Dose  . aspirin EC 81 MG tablet Take 1 tablet (81 mg total) by mouth daily. (Patient not taking: Reported on 10/11/2018) 30 tablet 11   . cholecalciferol (VITAMIN D) 1000 units tablet Take 2 tablets (2,000 Units total) by mouth daily. 30 tablet 6   . clindamycin (CLEOCIN) 300 MG capsule Take 1 capsule (300 mg total) by mouth 4 (four) times daily. X 7 days 28 capsule 0   . folic acid (FOLVITE) 1 MG tablet Take 1 tablet (1 mg total) by mouth daily. 90 tablet 2   . gabapentin (NEURONTIN) 300 MG capsule TAKE 1 CAPSULE(300 MG) BY MOUTH THREE TIMES DAILY (Patient taking differently: Take 300 mg by mouth 3 (three) times daily. ) 90 capsule 0   . hydroxyurea (HYDREA) 500 MG capsule Take 1 capsule (500 mg total)  by mouth 2 (two) times daily. May take with food to minimize GI side effects. 180 capsule 2   . ibuprofen (ADVIL,MOTRIN) 800 MG tablet Take 1 tablet (800 mg total) by mouth 3 (three) times daily as needed (pain). 30 tablet 0   . Nutritional Supplements (ENSURE ACTIVE HIGH PROTEIN) LIQD Take 1 each by mouth 3 (three) times daily between meals. #90 cans of Ensure 237 mL 11   . oxyCODONE (OXYCONTIN) 20 mg 12 hr tablet Take 1 tablet (20 mg total) by mouth every 12 (twelve) hours. 64 tablet 0   . oxyCODONE-acetaminophen (PERCOCET) 10-325 MG tablet Take 1 tablet by mouth every 6 (six) hours as needed for up to 15 days for pain. 60 tablet 0     Allergies: Buprenorphine hcl, Levaquin [levofloxacin], Meperidine, Morphine and related, Toradol [ketorolac tromethamine], Tramadol, Vancomycin, and Zosyn [piperacillin sod-tazobactam so] Past Medical History:  Diagnosis Date  . Depression   . 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, left shoulder ([place]) y-3   associated with Hb SS  . Sickle cell anemia (HCC)   . Vitamin D deficiency y-6   Past Surgical History:  Procedure Laterality Date  . CHOLECYSTECTOMY     Family History  Problem Relation Age of Onset  . Sickle cell trait Mother   . Sickle cell trait Father    Social History   Socioeconomic History  . Marital status: Single    Spouse name: Not on file  . Number of children: Not on file  . Years of education: Not on file  . Highest education level: Not on file  Occupational History  . Not on file  Social Needs  . Financial resource strain: Not on file  . Food insecurity    Worry: Not on file    Inability: Not on file  . Transportation needs    Medical: Not on file    Non-medical: Not on file  Tobacco Use  . Smoking status: Never Smoker  . Smokeless tobacco: Never Used  Substance and Sexual Activity  . Alcohol use: No  . Drug use: Not Currently    Types: Marijuana  . Sexual activity: Not on file  Lifestyle  . Physical  activity    Days per week: Not on file    Minutes per session: Not on file  . Stress: Not on file  Relationships  . Social Herbalist on phone: Not on file    Gets together: Not on file    Attends religious service: Not on file    Active member of club or organization: Not on file    Attends meetings of clubs or organizations: Not on file    Relationship status: Not on file  . Intimate partner violence    Fear of current or ex partner: Not on file    Emotionally abused: Not on file    Physically abused: Not on file    Forced sexual activity: Not on file  Other Topics Concern  . Not on file  Social History Narrative  . Not on file   Review of Systems  Constitutional: Negative for chills and fever.  HENT: Negative.   Eyes: Negative.   Respiratory: Negative.  Negative for cough and hemoptysis.   Cardiovascular: Negative.   Gastrointestinal: Negative for heartburn and nausea.  Genitourinary: Negative.   Musculoskeletal: Positive for back pain and joint pain.  Skin: Negative.    Physical Exam: Blood pressure 118/69, pulse 73, temperature 98.4 F (36.9 C), temperature source Oral, resp. rate 18, SpO2 100 %.  Physical Exam Constitutional:      Appearance: Normal appearance.  HENT:     Head: Normocephalic.     Mouth/Throat:     Mouth: Mucous membranes are moist.     Pharynx: Oropharynx is clear.  Eyes:     Pupils: Pupils are equal, round, and reactive to light.  Cardiovascular:     Rate and Rhythm: Normal rate and regular rhythm.     Pulses: Normal pulses.     Heart sounds: Normal heart sounds.  Pulmonary:     Effort: Pulmonary effort is normal.     Breath sounds: Normal breath sounds.  Abdominal:     General: Bowel sounds are normal.     Palpations: Abdomen is soft.  Musculoskeletal: Normal range of motion.  Skin:    General: Skin is warm.  Neurological:     General: No focal deficit present.     Mental Status: He is alert. Mental status is at  baseline.  Psychiatric:        Mood and Affect: Mood normal.        Behavior: Behavior normal.        Thought Content: Thought content normal.        Judgment: Judgment normal.      Lab results: Results for orders placed or performed during the hospital encounter of 12/12/18 (from the past 24 hour(s))  CBC with Differential/Platelet     Status: Abnormal   Collection Time: 12/12/18 11:36 AM  Result Value Ref Range   WBC 10.6 (H) 4.0 - 10.5  K/uL   RBC 3.98 (L) 4.22 - 5.81 MIL/uL   Hemoglobin 11.4 (L) 13.0 - 17.0 g/dL   HCT 33.3 (L) 39.0 - 52.0 %   MCV 83.7 80.0 - 100.0 fL   MCH 28.6 26.0 - 34.0 pg   MCHC 34.2 30.0 - 36.0 g/dL   RDW 18.1 (H) 11.5 - 15.5 %   Platelets 408 (H) 150 - 400 K/uL   nRBC 0.3 (H) 0.0 - 0.2 %   Neutrophils Relative % 64 %   Neutro Abs 6.8 1.7 - 7.7 K/uL   Lymphocytes Relative 24 %   Lymphs Abs 2.5 0.7 - 4.0 K/uL   Monocytes Relative 10 %   Monocytes Absolute 1.0 0.1 - 1.0 K/uL   Eosinophils Relative 2 %   Eosinophils Absolute 0.2 0.0 - 0.5 K/uL   Basophils Relative 0 %   Basophils Absolute 0.0 0.0 - 0.1 K/uL   Immature Granulocytes 0 %   Abs Immature Granulocytes 0.02 0.00 - 0.07 K/uL  Comprehensive metabolic panel     Status: Abnormal   Collection Time: 12/12/18 11:36 AM  Result Value Ref Range   Sodium 137 135 - 145 mmol/L   Potassium 3.3 (L) 3.5 - 5.1 mmol/L   Chloride 103 98 - 111 mmol/L   CO2 25 22 - 32 mmol/L   Glucose, Bld 101 (H) 70 - 99 mg/dL   BUN 8 6 - 20 mg/dL   Creatinine, Ser 0.62 0.61 - 1.24 mg/dL   Calcium 9.1 8.9 - 10.3 mg/dL   Total Protein 8.2 (H) 6.5 - 8.1 g/dL   Albumin 4.5 3.5 - 5.0 g/dL   AST 28 15 - 41 U/L   ALT 21 0 - 44 U/L   Alkaline Phosphatase 76 38 - 126 U/L   Total Bilirubin 3.2 (H) 0.3 - 1.2 mg/dL   GFR calc non Af Amer >60 >60 mL/min   GFR calc Af Amer >60 >60 mL/min   Anion gap 9 5 - 15  Reticulocytes     Status: Abnormal   Collection Time: 12/12/18 11:36 AM  Result Value Ref Range   Retic Ct Pct 3.4  (H) 0.4 - 3.1 %   RBC. 3.98 (L) 4.22 - 5.81 MIL/uL   Retic Count, Absolute 136.5 19.0 - 186.0 K/uL   Immature Retic Fract 26.3 (H) 2.3 - 15.9 %    Imaging results:  No results found.   Assessment & Plan: Patient admitted to sickle cell day infusion center for management of pain crisis. He is opiate tolerant. Pain managed with IV Dilaudid via PCA with settings of 0.5 mg, 10-minute lockout, and 3 mg/h Toradol 15 mg IV x1 Tylenol 1000 mg by mouth x1 Patient has poor venous access.  IV team had to be consulted.  Patient received Dilaudid 2 mg subcutaneously prior to gaining IV access. IV fluids, 0.45% saline at 100 mL/h Review CBC with differential, CMP, and reticulocytes as results become available.  Patient's baseline hemoglobin is 10.0-11.0 g/dL.  Patient typically not transfused unless hemoglobin is less than 7.0 g/dL. Pain intensity will be reevaluated in context of functioning in relationship to baseline as his care progresses. If pain intensity remains elevated, transition to inpatient services for higher level of care.    [name]  APRN, MSN, FNP-C [facility] [address] [place], [place] [address] [phone]  12/12/2018, 12:34 PM

## 2018-12-18 ENCOUNTER — Non-Acute Institutional Stay (HOSPITAL_COMMUNITY)
Admission: AD | Admit: 2018-12-18 | Discharge: 2018-12-18 | Disposition: A | Discharge status: Home / Self Care | Payer: Medicaid Other | Source: Ambulatory Visit | Attending: Internal Medicine | Admitting: Internal Medicine

## 2018-12-18 ENCOUNTER — Telehealth (HOSPITAL_COMMUNITY): Payer: Self-pay | Admitting: *Deleted

## 2018-12-18 ENCOUNTER — Encounter (HOSPITAL_COMMUNITY): Payer: Self-pay | Admitting: *Deleted

## 2018-12-18 DIAGNOSIS — F1120 Opioid dependence, uncomplicated: Secondary | ICD-10-CM | POA: Insufficient documentation

## 2018-12-18 DIAGNOSIS — Z7982 Long term (current) use of aspirin: Secondary | ICD-10-CM | POA: Insufficient documentation

## 2018-12-18 DIAGNOSIS — Z880 Allergy status to penicillin: Secondary | ICD-10-CM | POA: Insufficient documentation

## 2018-12-18 DIAGNOSIS — D5700 Hb-SS disease with crisis, unspecified: Secondary | ICD-10-CM | POA: Diagnosis present

## 2018-12-18 DIAGNOSIS — Z885 Allergy status to narcotic agent status: Secondary | ICD-10-CM | POA: Insufficient documentation

## 2018-12-18 DIAGNOSIS — Z881 Allergy status to other antibiotic agents status: Secondary | ICD-10-CM | POA: Diagnosis not present

## 2018-12-18 DIAGNOSIS — G894 Chronic pain syndrome: Secondary | ICD-10-CM | POA: Diagnosis not present

## 2018-12-18 DIAGNOSIS — D638 Anemia in other chronic diseases classified elsewhere: Secondary | ICD-10-CM | POA: Insufficient documentation

## 2018-12-18 DIAGNOSIS — Z888 Allergy status to other drugs, medicaments and biological substances status: Secondary | ICD-10-CM | POA: Insufficient documentation

## 2018-12-18 DIAGNOSIS — Z79899 Other long term (current) drug therapy: Secondary | ICD-10-CM | POA: Insufficient documentation

## 2018-12-18 DIAGNOSIS — D571 Sickle-cell disease without crisis: Secondary | ICD-10-CM | POA: Diagnosis present

## 2018-12-18 LAB — RETICULOCYTES
Immature Retic Fract: 34.3 % — ABNORMAL HIGH (ref 2.3–15.9)
RBC.: 4.04 MIL/uL — ABNORMAL LOW (ref 4.22–5.81)
Retic Count, Absolute: 178.2 10*3/uL (ref 19.0–186.0)
Retic Ct Pct: 4.4 % — ABNORMAL HIGH (ref 0.4–3.1)

## 2018-12-18 LAB — CBC WITH DIFFERENTIAL/PLATELET
Abs Immature Granulocytes: 0.04 10*3/uL (ref 0.00–0.07)
Basophils Absolute: 0.1 10*3/uL (ref 0.0–0.1)
Basophils Relative: 1 %
Eosinophils Absolute: 0.3 10*3/uL (ref 0.0–0.5)
Eosinophils Relative: 3 %
HCT: 34.2 % — ABNORMAL LOW (ref 39.0–52.0)
Hemoglobin: 11.8 g/dL — ABNORMAL LOW (ref 13.0–17.0)
Immature Granulocytes: 0 %
Lymphocytes Relative: 24 %
Lymphs Abs: 2.3 10*3/uL (ref 0.7–4.0)
MCH: 29.2 pg (ref 26.0–34.0)
MCHC: 34.5 g/dL (ref 30.0–36.0)
MCV: 84.7 fL (ref 80.0–100.0)
Monocytes Absolute: 0.7 10*3/uL (ref 0.1–1.0)
Monocytes Relative: 7 %
Neutro Abs: 6.4 10*3/uL (ref 1.7–7.7)
Neutrophils Relative %: 65 %
Platelets: 539 10*3/uL — ABNORMAL HIGH (ref 150–400)
RBC: 4.04 MIL/uL — ABNORMAL LOW (ref 4.22–5.81)
RDW: 18.7 % — ABNORMAL HIGH (ref 11.5–15.5)
WBC: 9.8 10*3/uL (ref 4.0–10.5)
nRBC: 0.8 % — ABNORMAL HIGH (ref 0.0–0.2)

## 2018-12-18 LAB — RAPID URINE DRUG SCREEN, HOSP PERFORMED
Amphetamines: NOT DETECTED
Barbiturates: NOT DETECTED
Benzodiazepines: NOT DETECTED
Cocaine: NOT DETECTED
Opiates: NOT DETECTED
Tetrahydrocannabinol: POSITIVE — AB

## 2018-12-18 LAB — COMPREHENSIVE METABOLIC PANEL
ALT: 13 U/L (ref 0–44)
AST: 23 U/L (ref 15–41)
Albumin: 5 g/dL (ref 3.5–5.0)
Alkaline Phosphatase: 77 U/L (ref 38–126)
Anion gap: 8 (ref 5–15)
BUN: 5 mg/dL — ABNORMAL LOW (ref 6–20)
CO2: 26 mmol/L (ref 22–32)
Calcium: 9.7 mg/dL (ref 8.9–10.3)
Chloride: 107 mmol/L (ref 98–111)
Creatinine, Ser: 0.53 mg/dL — ABNORMAL LOW (ref 0.61–1.24)
GFR calc Af Amer: >60 mL/min (ref >60)
GFR calc non Af Amer: >60 mL/min (ref >60)
Glucose, Bld: 91 mg/dL (ref 70–99)
Potassium: 3.5 mmol/L (ref 3.5–5.1)
Sodium: 141 mmol/L (ref 135–145)
Total Bilirubin: 2.5 mg/dL — ABNORMAL HIGH (ref 0.3–1.2)
Total Protein: 8.8 g/dL — ABNORMAL HIGH (ref 6.5–8.1)

## 2018-12-18 MED ORDER — DEXTROSE-NACL 5-0.45 % IV SOLN
INTRAVENOUS | Status: DC
  Administered 2018-12-18: 10:00:00 via INTRAVENOUS

## 2018-12-18 MED ORDER — NALOXONE HCL 0.4 MG/ML IJ SOLN: 0.4000 mg | INTRAMUSCULAR | Status: DC | PRN

## 2018-12-18 MED ORDER — SODIUM CHLORIDE 0.9% FLUSH: 9.0000 mL | INTRAVENOUS | Status: DC | PRN

## 2018-12-18 MED ORDER — HYDROMORPHONE 1 MG/ML IV SOLN
INTRAVENOUS | Status: DC
  Administered 2018-12-18: 30 mg via INTRAVENOUS
  Filled 2018-12-18: qty 30

## 2018-12-18 MED ORDER — ONDANSETRON HCL 4 MG/2ML IJ SOLN
4.0000 mg | Freq: Four times a day (QID) | INTRAMUSCULAR | Status: DC | PRN
  Administered 2018-12-18 (×2): 4 mg via INTRAVENOUS
  Filled 2018-12-18 (×2): qty 2

## 2018-12-18 NOTE — Progress Notes (Signed)
Patient admitted to the day infusion hospital for sickle cell pain crisis. Initially, patient reported back pain rated 9/10. For pain management, patient placed on Dilaudid PCA and hydrated with IV fluids. At discharge, patient rated pain at 6/10. Vital signs stable. Discharge instructions given. Patient alert, oriented and ambulatory at discharge.

## 2018-12-18 NOTE — Discharge Summary (Signed)
[facility] Discharge Summary   Patient ID: Timothy Marks MRN: [id number] DOB/AGE: [birth year] 28 y.o.  Admit date: 12/18/2018 Discharge date: 12/18/2018  Primary Care Physician:  Tresa Garter, MD  Admission Diagnoses:  Active Problems:   Sickle cell pain crisis [facility])   Discharge Medications:  Allergies as of 12/18/2018      Reactions   Buprenorphine Hcl Hives   Levaquin [levofloxacin] Itching   Meperidine Rash   Morphine And Related Hives   Toradol [ketorolac Tromethamine] Itching   Tramadol Hives   Vancomycin Itching   Zosyn [piperacillin Sod-tazobactam So] Itching, Rash   Has taken rocephin in past      Medication List    TAKE these medications   aspirin EC 81 MG tablet Take 1 tablet (81 mg total) by mouth daily.   cholecalciferol 25 MCG (1000 UT) tablet Commonly known as: VITAMIN D Take 2 tablets (2,000 Units total) by mouth daily.   clindamycin 300 MG capsule Commonly known as: CLEOCIN Take 1 capsule (300 mg total) by mouth 4 (four) times daily. X 7 days   Ensure Active High Protein Liqd Take 1 each by mouth 3 (three) times daily between meals. #90 cans of Ensure   folic acid 1 MG tablet Commonly known as: FOLVITE Take 1 tablet (1 mg total) by mouth daily.   gabapentin 300 MG capsule Commonly known as: NEURONTIN TAKE 1 CAPSULE(300 MG) BY MOUTH THREE TIMES DAILY What changed: See the new instructions.   hydroxyurea 500 MG capsule Commonly known as: HYDREA Take 1 capsule (500 mg total) by mouth 2 (two) times daily. May take with food to minimize GI side effects.   ibuprofen 800 MG tablet Commonly known as: ADVIL Take 1 tablet (800 mg total) by mouth 3 (three) times daily as needed (pain).   oxyCODONE 20 mg 12 hr tablet Commonly known as: OXYCONTIN Take 1 tablet (20 mg total) by mouth every 12 (twelve) hours.   oxyCODONE-acetaminophen 10-325 MG tablet Commonly known as: PERCOCET Take 1 tablet by mouth every 6 (six) hours as  needed for up to 15 days for pain.        Consults:  None  Significant Diagnostic Studies:  No results found.  History of present Timothy Marks, a 28 year old male with a medical history significant for sickle cell disease, chronic pain syndrome, dependent intolerant, history of anemia of chronic disease, history of oppositional defiant, and history of osteonecrosis of left shoulder presents complaining of pain primarily to low back.  Patient has not identified any provocative factors concerning crisis.  He says that he last had Percocet around 6 AM without sustained relief.  Pain intensity is 8/10 characterized as constant and aching.  Patient denies sick contacts, recent travel, fever, chills, or exposure to COVID-19.  He also denies blurred vision, dizziness, paresthesias.  No nausea, vomiting, or diarrhea.  Also, denies shortness of breath, chest pain, persistent cough, or sore throat. [facility] Course: Patient admitted to sickle cell day infusion center for management of pain crisis. Reviewed all laboratory values, consistent with patient's baseline. On reviewing urine drug screen, it is found that patient does not have opiates in urine.  Patient says that he took oxycodone this a.m. and has been taking medication consistently over the past several days.  Recommend that patient follows up in primary care for medication management. Pain managed with IV Dilaudid via PCA.  Settings of 0.5 mg, 10-minute lockout, and 3 mg/h. IV Toradol 15 mg x 1  IV fluids, D5 0.45% saline at 125 mL/h Tylenol 1000 mg by mouth x1. Pain intensity decreased to 5/10.  Patient says that he can manage at home on prescribed medications.  Patient does not warrant admission at this time. He is alert, oriented, and ambulating without assistance. He will discharge home in a hemodynamically stable condition.  Discharge instructions: Resume all home medications.  Follow up with PCP as previously   scheduled.   Discussed the importance of drinking 64 ounces of water daily to  help prevent pain crises, it is important to drink plenty of water throughout the day. This is because dehydration of red blood cells may lead further sickling.   Avoid all stressors that precipitate sickle cell pain crisis.     The patient was given clear instructions to go to ER or return to medical center if symptoms do not improve, worsen or new problems develop.     Physical Exam at Discharge:  BP 114/68 (BP Location: Left Arm)   Pulse 85   Temp 98.4 F (36.9 C) (Oral)   Resp 16   Wt 135 lb (61.2 kg)   SpO2 95%   BMI 17.33 kg/m  Physical Exam Constitutional:      Appearance: Normal appearance.  HENT:     Head: Normocephalic.  Eyes:     Pupils: Pupils are equal, round, and reactive to light.  Cardiovascular:     Rate and Rhythm: Normal rate and regular rhythm.     Pulses: Normal pulses.  Pulmonary:     Effort: Pulmonary effort is normal.     Breath sounds: Normal breath sounds.  Abdominal:     General: Abdomen is flat. Bowel sounds are normal.  Skin:    General: Skin is warm.  Neurological:     General: No focal deficit present.     Mental Status: He is alert. Mental status is at baseline.  Psychiatric:        Mood and Affect: Mood normal.        Behavior: Behavior normal.        Thought Content: Thought content normal.        Judgment: Judgment normal.      Disposition at Discharge: Discharge disposition: 01-Home or Self Care       Discharge Orders: Discharge Instructions    Discharge patient   Complete by: As directed    Discharge disposition: 01-Home or Self Care   Discharge patient date: 12/18/2018      Condition at Discharge:   Stable  Time spent on Discharge:  Greater than 30 minutes.  Signed: [name]  APRN, MSN, FNP-C [facility] [address] [place], [place] [address] [phone]  12/18/2018, 4:11  PM   This note was prepared using Dragon speech recognition software, errors in dictation are unintentional.

## 2018-12-18 NOTE — Telephone Encounter (Signed)
Patient called requesting to come to the day hospital for sickle cell pain. Patient reports back pain rated 9/10. Reports taking Percocet at 6:00 am. COVID-19 screening done and negative. Denies fever, chest pain, nausea, vomiting, diarrhea, abdominal pain and priapism. Admits to having transportation at discharge without driving self. [place], [place] notified. Patient can come to the day hospital for pain management. Patient advised and expresses an understanding.

## 2018-12-18 NOTE — Discharge Instructions (Signed)
Sickle Cell Anemia, Adult ° °Sickle cell anemia is a condition in which red blood cells have an abnormal “sickle” shape. Red blood cells carry oxygen through the body. Sickle-shaped red blood cells do not live as long as normal red blood cells. They also clump together and block blood from flowing through the blood vessels. This condition prevents the body from getting enough oxygen. Sickle cell anemia causes organ damage and pain. It also increases the risk of infection. °What are the causes? °This condition is caused by a gene that is passed from parent to child (inherited). Receiving two copies of the gene causes the disease. Receiving one copy causes the "trait," which means that symptoms are milder or not present. °What increases the risk? °This condition is more likely to develop if your ancestors were from [place], the Mediterranean, South or [place], the [place], [place], or the Middle [place]. °What are the signs or symptoms? °Symptoms of this condition include: °· Episodes of pain (crises), especially in the hands and feet, joints, back, chest, or abdomen. The pain can be triggered by: °? An illness, especially if there is dehydration. °? Doing an activity with great effort (overexertion). °? Exposure to extreme temperature changes. °? High altitude. °· Fatigue. °· Shortness of breath or difficulty breathing. °· Dizziness. °· Pale skin or yellowed skin (jaundice). °· Frequent bacterial infections. °· Pain and swelling in the hands and feet (hand-food syndrome). °· Prolonged, painful erection of the penis (priapism). °· Acute chest syndrome. Symptoms of this include: °? Chest pain. °? Fever. °? Cough. °? Fast breathing. °· Stroke. °· Decreased activity. °· Loss of appetite. °· Change in behavior. °· Headaches. °· Seizures. °· Vision changes. °· Skin ulcers. °· Heart disease. °· High blood pressure. °· Gallstones. °· Liver and kidney problems. °How is this diagnosed? °This condition is diagnosed with  blood tests that check for the gene that causes this condition. °How is this treated? °There is no cure for most cases of this condition. Treatment focuses on managing your symptoms and preventing complications of the disease. Your health care provider will work with you to identify the best treatment options for you based on an assessment of your condition. Treatment may include: °· Medicines, including: °? Pain medicines. °? Antibiotic medicines for infection. °? Medicines to increase the production of a protein in red blood cells that helps carry oxygen in the body (hemoglobin). °· Fluids to treat pain and swelling. °· Oxygen to treat acute chest syndrome. °· Blood transfusions to treat symptoms such as fatigue, stroke, and acute chest syndrome. °· Massage and physical therapy for pain. °· Regular tests to monitor your condition, such as blood tests, X-rays, CT scans, MRI scans, ultrasounds, and lung function tests. These should be done every 3-12 months, depending on your age. °· Hematopoietic stem cell transplant. This is a procedure to replace abnormal stem cells with healthy stem cells from a donor's bone marrow. Stem cells are cells that can develop into blood cells, and bone marrow is the spongy tissue inside the bones. °Follow these instructions at home: °Medicines °· Take over-the-counter and prescription medicines only as told by your health care provider. °· If you were prescribed an antibiotic medicine, take it as told by your health care provider. Do not stop taking the antibiotic even if you start to feel better. °· If you develop a fever, do not take medicines to reduce the fever right away. This could cover up another problem. Notify your health care provider. °Managing   pain, stiffness, and swelling °· Try these methods to help ease your pain: °? Using a heating pad. °? Taking a warm bath. °? Distracting yourself, such as by watching TV. °Eating and drinking °· Drink enough fluid to keep your urine  clear or pale yellow. Drink more in hot weather and during exercise. °· Limit or avoid drinking alcohol. °· Eat a balanced and nutritious diet. Eat plenty of fruits, vegetables, whole grains, and lean protein. °· Take vitamins and supplements as directed by your health care provider. °Traveling °· When traveling, keep these with you: °? Your medical information. °? The names of your health care providers. °? Your medicines. °· If you have to travel by air, ask about precautions you should take. °Activity °· Get plenty of rest. °· Avoid activities that will lower your oxygen levels, such as exercising vigorously. °General instructions °· Do not use any products that contain nicotine or tobacco, such as cigarettes and e-cigarettes. They lower blood oxygen levels. If you need help quitting, ask your health care provider. °· Consider wearing a medical alert bracelet. °· Avoid high altitudes. °· Avoid extreme temperatures and extreme temperature changes. °· Keep all follow-up visits as told by your health care provider. This is important. °Contact a health care provider if: °· You develop joint pain. °· Your feet or hands swell or have pain. °· You have fatigue. °Get help right away if: °· You have symptoms of infection. These include: °? Fever. °? Chills. °? Extreme tiredness. °? Irritability. °? Poor eating. °? Vomiting. °· You feel dizzy or faint. °· You have new abdominal pain, especially on the left side near the stomach area. °· You develop priapism. °· You have numbness in your arms or legs or have trouble moving them. °· You have trouble talking. °· You develop pain that cannot be controlled with medicine. °· You become short of breath. °· You have rapid breathing. °· You have a persistent cough. °· You have pain in your chest. °· You develop a severe headache or stiff neck. °· You feel bloated without eating or after eating a small amount of food. °· Your skin is pale. °· You suddenly lose  vision. °Summary °· Sickle cell anemia is a condition in which red blood cells have an abnormal “sickle” shape. This disease can cause organ damage and chronic pain, and it can raise your risk of infection. °· Sickle cell anemia is a genetic disorder. °· Treatment focuses on managing your symptoms and preventing complications of the disease. °· Get medical help right away if you have any signs of infection, such as a fever. °This information is not intended to replace advice given to you by your health care provider. Make sure you discuss any questions you have with your health care provider. °Document Released: 05/11/2005 Document Revised: 05/25/2018 Document Reviewed: 03/08/2016 °Elsevier Patient Education © 2020 [employer] ° °

## 2018-12-18 NOTE — H&P (Addendum)
[facility] History and Physical   Date: 12/18/2018  Patient name: Timothy Marks Medical record number: [id number] Date of birth: 1990/12/25 Age: 28 y.o. Gender: male PCP: Tresa Garter, MD  Attending physician: Tresa Garter, MD  Chief Complaint: Sickle cell pain  History of Present Illness: Keyvin Eavey, a 28 year old male with a medical history significant for sickle cell disease, chronic pain syndrome, opiate dependence and tolerance, history of anemia of chronic disease, history of oppositional defiance, and history of osteonecrosis of left shoulder presents complaining of pain primarily to low back.  Patient has not identified any provocative factors concerning crisis.  Patient says that he last had Percocet around 6 AM without sustained relief.  Pain intensity is 8/10 characterized as constant and aching.  Patient denies sick contacts, recent travel, fever, chills, or exposure to COVID-19.  He also denies blurred vision, dizziness, paresthesias..  No nausea, vomiting, or diarrhea.  Also, denies shortness of breath, chest pain, persistent cough, or sore throat.  Meds: Medications Prior to Admission  Medication Sig Dispense Refill Last Dose  . cholecalciferol (VITAMIN D) 1000 units tablet Take 2 tablets (2,000 Units total) by mouth daily. 30 tablet 6 12/18/2018 at Unknown time  . folic acid (FOLVITE) 1 MG tablet Take 1 tablet (1 mg total) by mouth daily. 90 tablet 2 12/18/2018 at Unknown time  . gabapentin (NEURONTIN) 300 MG capsule TAKE 1 CAPSULE(300 MG) BY MOUTH THREE TIMES DAILY (Patient taking differently: Take 300 mg by mouth 3 (three) times daily. ) 90 capsule 0 12/18/2018 at Unknown time  . hydroxyurea (HYDREA) 500 MG capsule Take 1 capsule (500 mg total) by mouth 2 (two) times daily. May take with food to minimize GI side effects. 180 capsule 2 12/18/2018 at Unknown time  . ibuprofen (ADVIL,MOTRIN) 800 MG tablet Take 1 tablet (800 mg total) by mouth 3  (three) times daily as needed (pain). 30 tablet 0 12/18/2018 at Unknown time  . Nutritional Supplements (ENSURE ACTIVE HIGH PROTEIN) LIQD Take 1 each by mouth 3 (three) times daily between meals. #90 cans of Ensure 237 mL 11 12/18/2018 at Unknown time  . oxyCODONE (OXYCONTIN) 20 mg 12 hr tablet Take 1 tablet (20 mg total) by mouth every 12 (twelve) hours. 64 tablet 0 12/18/2018 at Unknown time  . oxyCODONE-acetaminophen (PERCOCET) 10-325 MG tablet Take 1 tablet by mouth every 6 (six) hours as needed for up to 15 days for pain. 60 tablet 0 12/18/2018 at Unknown time  . aspirin EC 81 MG tablet Take 1 tablet (81 mg total) by mouth daily. (Patient not taking: Reported on 10/11/2018) 30 tablet 11   . clindamycin (CLEOCIN) 300 MG capsule Take 1 capsule (300 mg total) by mouth 4 (four) times daily. X 7 days 28 capsule 0     Allergies: Buprenorphine hcl, Levaquin [levofloxacin], Meperidine, Morphine and related, Toradol [ketorolac tromethamine], Tramadol, Vancomycin, and Zosyn [piperacillin sod-tazobactam so] Past Medical History:  Diagnosis Date  . Depression   . 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, left shoulder ([place]) y-3   associated with Hb SS  . Sickle cell anemia (HCC)   . Vitamin D deficiency y-6   Past Surgical History:  Procedure Laterality Date  . CHOLECYSTECTOMY     Family History  Problem Relation Age of Onset  . Sickle cell trait Mother   . Sickle cell trait Father    Social History   Socioeconomic History  . Marital status: Single    Spouse name: Not on file  .  Number of children: Not on file  . Years of education: Not on file  . Highest education level: Not on file  Occupational History  . Not on file  Social Needs  . Financial resource strain: Not on file  . Food insecurity    Worry: Not on file    Inability: Not on file  . Transportation needs    Medical: Not on file    Non-medical: Not on file  Tobacco Use  . Smoking status: Never Smoker  . Smokeless  tobacco: Never Used  Substance and Sexual Activity  . Alcohol use: No  . Drug use: Not Currently    Types: Marijuana  . Sexual activity: Not on file  Lifestyle  . Physical activity    Days per week: Not on file    Minutes per session: Not on file  . Stress: Not on file  Relationships  . Social Herbalist on phone: Not on file    Gets together: Not on file    Attends religious service: Not on file    Active member of club or organization: Not on file    Attends meetings of clubs or organizations: Not on file    Relationship status: Not on file  . Intimate partner violence    Fear of current or ex partner: Not on file    Emotionally abused: Not on file    Physically abused: Not on file    Forced sexual activity: Not on file  Other Topics Concern  . Not on file  Social History Narrative  . Not on file   Review of Systems  Constitutional: Negative.   HENT: Negative.   Eyes: Negative.   Respiratory: Negative.   Cardiovascular: Negative.  Negative for chest pain.  Gastrointestinal: Negative.   Genitourinary: Negative.   Musculoskeletal: Positive for back pain.  Skin: Negative.   Neurological: Negative.   Psychiatric/Behavioral: Negative.    Physical Exam: Blood pressure (!) 107/51, pulse 84, temperature 98.4 F (36.9 C), temperature source Oral, resp. rate 19, weight 135 lb (61.2 kg), SpO2 99 %. Physical Exam Constitutional:      Appearance: Normal appearance.  HENT:     Head: Normocephalic.     Nose: Nose normal.     Mouth/Throat:     Mouth: Mucous membranes are moist.     Pharynx: Oropharynx is clear.  Eyes:     Pupils: Pupils are equal, round, and reactive to light.  Cardiovascular:     Rate and Rhythm: Normal rate and regular rhythm.  Pulmonary:     Effort: Pulmonary effort is normal.  Abdominal:     General: Abdomen is flat.  Skin:    General: Skin is warm.  Neurological:     General: No focal deficit present.     Mental Status: He is alert.  Mental status is at baseline.  Psychiatric:        Mood and Affect: Mood normal.        Behavior: Behavior normal.        Thought Content: Thought content normal.        Judgment: Judgment normal.      Lab results: Results for orders placed or performed during the hospital encounter of 12/18/18 (from the past 24 hour(s))  Rapid urine drug screen (hospital performed)     Status: Abnormal   Collection Time: 12/18/18  8:52 AM  Result Value Ref Range   Opiates NONE DETECTED NONE DETECTED   Cocaine NONE DETECTED  NONE DETECTED   Benzodiazepines NONE DETECTED NONE DETECTED   Amphetamines NONE DETECTED NONE DETECTED   Tetrahydrocannabinol POSITIVE (A) NONE DETECTED   Barbiturates NONE DETECTED NONE DETECTED  CBC WITH DIFFERENTIAL     Status: Abnormal   Collection Time: 12/18/18 10:05 AM  Result Value Ref Range   WBC 9.8 4.0 - 10.5 K/uL   RBC 4.04 (L) 4.22 - 5.81 MIL/uL   Hemoglobin 11.8 (L) 13.0 - 17.0 g/dL   HCT 34.2 (L) 39.0 - 52.0 %   MCV 84.7 80.0 - 100.0 fL   MCH 29.2 26.0 - 34.0 pg   MCHC 34.5 30.0 - 36.0 g/dL   RDW 18.7 (H) 11.5 - 15.5 %   Platelets 539 (H) 150 - 400 K/uL   nRBC 0.8 (H) 0.0 - 0.2 %   Neutrophils Relative % 65 %   Neutro Abs 6.4 1.7 - 7.7 K/uL   Lymphocytes Relative 24 %   Lymphs Abs 2.3 0.7 - 4.0 K/uL   Monocytes Relative 7 %   Monocytes Absolute 0.7 0.1 - 1.0 K/uL   Eosinophils Relative 3 %   Eosinophils Absolute 0.3 0.0 - 0.5 K/uL   Basophils Relative 1 %   Basophils Absolute 0.1 0.0 - 0.1 K/uL   Immature Granulocytes 0 %   Abs Immature Granulocytes 0.04 0.00 - 0.07 K/uL  Reticulocytes     Status: Abnormal   Collection Time: 12/18/18 10:05 AM  Result Value Ref Range   Retic Ct Pct 4.4 (H) 0.4 - 3.1 %   RBC. 4.04 (L) 4.22 - 5.81 MIL/uL   Retic Count, Absolute 178.2 19.0 - 186.0 K/uL   Immature Retic Fract 34.3 (H) 2.3 - 15.9 %  Comprehensive metabolic panel     Status: Abnormal   Collection Time: 12/18/18 10:05 AM  Result Value Ref Range    Sodium 141 135 - 145 mmol/L   Potassium 3.5 3.5 - 5.1 mmol/L   Chloride 107 98 - 111 mmol/L   CO2 26 22 - 32 mmol/L   Glucose, Bld 91 70 - 99 mg/dL   BUN 5 (L) 6 - 20 mg/dL   Creatinine, Ser 0.53 (L) 0.61 - 1.24 mg/dL   Calcium 9.7 8.9 - 10.3 mg/dL   Total Protein 8.8 (H) 6.5 - 8.1 g/dL   Albumin 5.0 3.5 - 5.0 g/dL   AST 23 15 - 41 U/L   ALT 13 0 - 44 U/L   Alkaline Phosphatase 77 38 - 126 U/L   Total Bilirubin 2.5 (H) 0.3 - 1.2 mg/dL   GFR calc non Af Amer >60 >60 mL/min   GFR calc Af Amer >60 >60 mL/min   Anion gap 8 5 - 15    Imaging results:  No results found.   Assessment & Plan:  Patient admitted to sickle cell day infusion center for management of pain crisis Patient opiate tolerant IV fluids, D5 0.45% saline at 125 mL/h IV Toradol 15 mg x 1 Tylenol 1000 mg by mouth x1 IV Dilaudid via PCA.  Settings of 0.5 mg, 10-minute lockout, and 3 mg/h Review CBC with differential, UDS, complete metabolic panel, and reticulocytes as results become available. Pain will be reevaluated in context of functioning and relationship to baseline as his care progresses If pain intensity remains elevated, transition to inpatient services for higher level of care.  [name]  APRN, MSN, FNP-C [facility] [address] [place], [place] [address] [phone]  This note was prepared  using Dragon speech recognition software, errors in dictation are unintentional.    12/18/2018, 12:13 PM

## 2018-12-21 ENCOUNTER — Emergency Department (HOSPITAL_COMMUNITY)
Admission: EM | Admit: 2018-12-21 | Discharge: 2018-12-21 | Disposition: A | Discharge status: Home / Self Care | Payer: Medicaid Other | Attending: Emergency Medicine | Admitting: Emergency Medicine

## 2018-12-21 ENCOUNTER — Other Ambulatory Visit: Payer: Self-pay

## 2018-12-21 ENCOUNTER — Non-Acute Institutional Stay (HOSPITAL_BASED_OUTPATIENT_CLINIC_OR_DEPARTMENT_OTHER)
Admission: AD | Admit: 2018-12-21 | Discharge: 2018-12-21 | Disposition: A | Discharge status: Home / Self Care | Payer: Medicaid Other | Source: Ambulatory Visit | Attending: Internal Medicine | Admitting: Internal Medicine

## 2018-12-21 ENCOUNTER — Encounter (HOSPITAL_COMMUNITY): Payer: Self-pay

## 2018-12-21 DIAGNOSIS — G894 Chronic pain syndrome: Secondary | ICD-10-CM | POA: Insufficient documentation

## 2018-12-21 DIAGNOSIS — D5700 Hb-SS disease with crisis, unspecified: Secondary | ICD-10-CM | POA: Diagnosis not present

## 2018-12-21 DIAGNOSIS — M549 Dorsalgia, unspecified: Secondary | ICD-10-CM | POA: Diagnosis present

## 2018-12-21 DIAGNOSIS — Z79899 Other long term (current) drug therapy: Secondary | ICD-10-CM | POA: Diagnosis not present

## 2018-12-21 LAB — RETICULOCYTES
Immature Retic Fract: 39.3 % — ABNORMAL HIGH (ref 2.3–15.9)
RBC.: 3.81 MIL/uL — ABNORMAL LOW (ref 4.22–5.81)
Retic Count, Absolute: 268.2 10*3/uL — ABNORMAL HIGH (ref 19.0–186.0)
Retic Ct Pct: 7 % — ABNORMAL HIGH (ref 0.4–3.1)

## 2018-12-21 LAB — COMPREHENSIVE METABOLIC PANEL
ALT: 9 U/L (ref 0–44)
AST: 24 U/L (ref 15–41)
Albumin: 4.7 g/dL (ref 3.5–5.0)
Alkaline Phosphatase: 70 U/L (ref 38–126)
Anion gap: 9 (ref 5–15)
BUN: 6 mg/dL (ref 6–20)
CO2: 24 mmol/L (ref 22–32)
Calcium: 9.3 mg/dL (ref 8.9–10.3)
Chloride: 106 mmol/L (ref 98–111)
Creatinine, Ser: 0.63 mg/dL (ref 0.61–1.24)
GFR calc Af Amer: >60 mL/min (ref >60)
GFR calc non Af Amer: >60 mL/min (ref >60)
Glucose, Bld: 101 mg/dL — ABNORMAL HIGH (ref 70–99)
Potassium: 3.8 mmol/L (ref 3.5–5.1)
Sodium: 139 mmol/L (ref 135–145)
Total Bilirubin: 1.6 mg/dL — ABNORMAL HIGH (ref 0.3–1.2)
Total Protein: 8.2 g/dL — ABNORMAL HIGH (ref 6.5–8.1)

## 2018-12-21 LAB — CBC WITH DIFFERENTIAL/PLATELET
Abs Immature Granulocytes: 0.05 10*3/uL (ref 0.00–0.07)
Basophils Absolute: 0.1 10*3/uL (ref 0.0–0.1)
Basophils Relative: 1 %
Eosinophils Absolute: 0.4 10*3/uL (ref 0.0–0.5)
Eosinophils Relative: 4 %
HCT: 32.5 % — ABNORMAL LOW (ref 39.0–52.0)
Hemoglobin: 11.2 g/dL — ABNORMAL LOW (ref 13.0–17.0)
Immature Granulocytes: 1 %
Lymphocytes Relative: 24 %
Lymphs Abs: 2.6 10*3/uL (ref 0.7–4.0)
MCH: 29.4 pg (ref 26.0–34.0)
MCHC: 34.5 g/dL (ref 30.0–36.0)
MCV: 85.3 fL (ref 80.0–100.0)
Monocytes Absolute: 0.7 10*3/uL (ref 0.1–1.0)
Monocytes Relative: 7 %
Neutro Abs: 7 10*3/uL (ref 1.7–7.7)
Neutrophils Relative %: 63 %
Platelets: 548 10*3/uL — ABNORMAL HIGH (ref 150–400)
RBC: 3.81 MIL/uL — ABNORMAL LOW (ref 4.22–5.81)
RDW: 18.9 % — ABNORMAL HIGH (ref 11.5–15.5)
WBC: 10.9 10*3/uL — ABNORMAL HIGH (ref 4.0–10.5)
nRBC: 0.7 % — ABNORMAL HIGH (ref 0.0–0.2)

## 2018-12-21 MED ORDER — SODIUM CHLORIDE 0.9 % IV SOLN
25.0000 mg | INTRAVENOUS | Status: DC | PRN
  Filled 2018-12-21: qty 0.5

## 2018-12-21 MED ORDER — SODIUM CHLORIDE 0.9% FLUSH
3.0000 mL | Freq: Once | INTRAVENOUS | Status: AC
  Administered 2018-12-21: 08:00:00 3 mL via INTRAVENOUS

## 2018-12-21 MED ORDER — NALOXONE HCL 0.4 MG/ML IJ SOLN: 0.4000 mg | INTRAMUSCULAR | Status: DC | PRN

## 2018-12-21 MED ORDER — HYDROMORPHONE HCL 2 MG/ML IJ SOLN
2.0000 mg | Freq: Once | INTRAMUSCULAR | Status: AC
  Administered 2018-12-21: 2 mg via INTRAMUSCULAR
  Filled 2018-12-21: qty 1

## 2018-12-21 MED ORDER — KETOROLAC TROMETHAMINE 30 MG/ML IJ SOLN
30.0000 mg | Freq: Once | INTRAMUSCULAR | Status: AC
  Administered 2018-12-21: 30 mg via INTRAMUSCULAR
  Filled 2018-12-21: qty 1

## 2018-12-21 MED ORDER — SODIUM CHLORIDE 0.9% FLUSH: 9.0000 mL | INTRAVENOUS | Status: DC | PRN

## 2018-12-21 MED ORDER — ONDANSETRON HCL 4 MG/2ML IJ SOLN
4.0000 mg | Freq: Four times a day (QID) | INTRAMUSCULAR | Status: DC | PRN
  Administered 2018-12-21: 4 mg via INTRAVENOUS
  Filled 2018-12-21: qty 2

## 2018-12-21 MED ORDER — HYDROMORPHONE 1 MG/ML IV SOLN
INTRAVENOUS | Status: DC
  Administered 2018-12-21: 30 mg via INTRAVENOUS
  Administered 2018-12-21: 8 mg via INTRAVENOUS
  Filled 2018-12-21: qty 30

## 2018-12-21 MED ORDER — DIPHENHYDRAMINE HCL 25 MG PO CAPS: 25.0000 mg | ORAL_CAPSULE | ORAL | Status: DC | PRN

## 2018-12-21 MED ORDER — DEXTROSE-NACL 5-0.45 % IV SOLN
INTRAVENOUS | Status: DC
  Administered 2018-12-21: 11:00:00 via INTRAVENOUS

## 2018-12-21 NOTE — ED Notes (Addendum)
[place] RN at bedside to transport pt to [facility] clinic in wheelchair. Pt belongings and IV left in place. DC instructions reviewed and verbalized understanding

## 2018-12-21 NOTE — Discharge Summary (Signed)
[facility] Discharge Summary   Patient ID: Timothy Marks MRN: [id number] DOB/AGE: 19-Jun-1990 28 y.o.  Admit date: 12/21/2018 Discharge date: 12/21/2018  Primary Care Physician:  Tresa Garter, MD  Admission Diagnoses:  Active Problems:   Sickle cell pain crisis [facility])  Discharge Medications:  Allergies as of 12/21/2018      Reactions   Buprenorphine Hcl Hives   Levaquin [levofloxacin] Itching   Meperidine Rash   Morphine And Related Hives   Toradol [ketorolac Tromethamine] Itching   Tramadol Hives   Vancomycin Itching   Zosyn [piperacillin Sod-tazobactam So] Itching, Rash   Has taken rocephin in past      Medication List    TAKE these medications   aspirin EC 81 MG tablet Take 1 tablet (81 mg total) by mouth daily.   cholecalciferol 25 MCG (1000 UT) tablet Commonly known as: VITAMIN D Take 2 tablets (2,000 Units total) by mouth daily.   clindamycin 300 MG capsule Commonly known as: CLEOCIN Take 1 capsule (300 mg total) by mouth 4 (four) times daily. X 7 days   Ensure Active High Protein Liqd Take 1 each by mouth 3 (three) times daily between meals. #90 cans of Ensure   folic acid 1 MG tablet Commonly known as: FOLVITE Take 1 tablet (1 mg total) by mouth daily.   gabapentin 300 MG capsule Commonly known as: NEURONTIN TAKE 1 CAPSULE(300 MG) BY MOUTH THREE TIMES DAILY What changed: See the new instructions.   hydroxyurea 500 MG capsule Commonly known as: HYDREA Take 1 capsule (500 mg total) by mouth 2 (two) times daily. May take with food to minimize GI side effects.   ibuprofen 800 MG tablet Commonly known as: ADVIL Take 1 tablet (800 mg total) by mouth 3 (three) times daily as needed (pain).   oxyCODONE 20 mg 12 hr tablet Commonly known as: OXYCONTIN Take 1 tablet (20 mg total) by mouth every 12 (twelve) hours.   oxyCODONE-acetaminophen 10-325 MG tablet Commonly known as: PERCOCET Take 1 tablet by mouth every 6 (six) hours as  needed for up to 15 days for pain.        Consults:  None  Significant Diagnostic Studies:  No results found. History of present illness: Timothy Marks, a 28 year old male with a medical history significant for sickle cell disease, chronic pain syndrome, opiate dependence and tolerance, history of anemia of chronic disease, history of oppositional defiance, and history of osteonecrosis of left shoulder presents complaining of pain primarily to low back.  Pain has persisted over the past several days.  Patient has not identified any palliative or provocative factors concerning crisis.  He states that he has been taking Tylenol and ibuprofen and lying on a heating pad without relief.  Patient was treated and evaluated in the emergency department this a.m.  Agreed with ER provider that patient was appropriate to transition to sickle cell day infusion center for pain management and extended observation.  Patient was also treated in sickle cell day infusion center on 12/18/2018 for this problem.  Pain intensity is 8/10 characterized as constant and throbbing.  Pain is primarily to low back and tailbone.  Patient denies sick contacts, recent travel, or exposure to COVID-19.  He also denies fever, chills, chest pain, shortness of breath, persistent cough, dysuria, nausea, vomiting, or diarrhea.  [facility] Course: Patient transition to sickle cell day infusion center from emergency department for pain management and extended observation. Reviewed all laboratory values, consistent with previous.  Labs did not warrant repeating. Pain managed with IV Dilaudid via PCA per weight-based protocol.  Settings of 0.5 mg, 10-minute lockout, and 3 mg/h. IV fluids, D5 0.45% saline at 50 mL/h Pain intensity decreased to 6/10.  Patient does not warrant admission at this time. Patient alert, oriented, and ambulating without assistance. He will discharge home in a hemodynamically stable  condition.  Discharge instructions: Resume all home medications.  Follow up with PCP as previously  scheduled.   Discussed the importance of drinking 64 ounces of water daily to  help prevent pain crises, it is important to drink plenty of water throughout the day. This is because dehydration of red blood cells may lead further sickling.   Avoid all stressors that precipitate sickle cell pain crisis.     The patient was given clear instructions to go to ER or return to medical center if symptoms do not improve, worsen or new problems develop.     Physical Exam at Discharge:  BP 123/82 (BP Location: Right Arm)   Pulse 66   Temp 97.8 F (36.6 C) (Oral)   Resp 10   SpO2 97%  Physical Exam Constitutional:      Appearance: Normal appearance.  HENT:     Nose: Nose normal.     Mouth/Throat:     Mouth: Mucous membranes are moist.     Pharynx: Oropharynx is clear.  Eyes:     Pupils: Pupils are equal, round, and reactive to light.  Cardiovascular:     Rate and Rhythm: Normal rate and regular rhythm.     Pulses: Normal pulses.  Pulmonary:     Effort: Pulmonary effort is normal.     Breath sounds: Normal breath sounds.  Abdominal:     General: Bowel sounds are normal.  Musculoskeletal: Normal range of motion.  Skin:    General: Skin is warm.  Neurological:     General: No focal deficit present.     Mental Status: He is alert. Mental status is at baseline.  Psychiatric:        Mood and Affect: Mood normal.        Behavior: Behavior normal.        Thought Content: Thought content normal.        Judgment: Judgment normal.      Disposition at Discharge: Discharge disposition: 01-Home or Self Care       Discharge Orders: Discharge Instructions    Discharge patient   Complete by: As directed    Discharge disposition: 01-Home or Self Care   Discharge patient date: 12/21/2018      Condition at Discharge:   Stable  Time spent on Discharge:  Greater than 30  minutes.  Signed:   [name]  APRN, MSN, FNP-C [facility] [address] [place], [place] [address] [phone]  12/21/2018, 5:00 PM This note was prepared using Dragon speech recognition software, errors in dictation are unintentional.

## 2018-12-21 NOTE — ED Triage Notes (Addendum)
Patient c/o SCC that started around 12 am this morning and has progressed.   C/O generalized back pain  Patient reports taking a percocet with no relief at 1am.   A/Ox4 Ambulatory to ED room.   Patient states he is a hard stick and sometimes needs IV team.

## 2018-12-21 NOTE — Discharge Instructions (Signed)
Please go straight to s

## 2018-12-21 NOTE — H&P (Signed)
[facility] History and Physical   Date: 12/21/2018  Patient name: Timothy Marks Medical record number: [id number] Date of birth: 02-29-1990 Age: 28 y.o. Gender: male PCP: Tresa Garter, MD  Attending physician: Tresa Garter, MD  Chief Complaint: Sickle cell pain  History of Present Illness: Timothy Marks, a 28 year old male with a medical history significant for sickle cell disease, chronic pain syndrome, opiate dependence and tolerance, history of anemia of chronic disease, history of oppositional defiance, and history of osteonecrosis of left shoulder presents complaining of pain primarily to low back.  Pain has persisted over the past several days.  Patient has not identified any palliative or provocative factors concerning crisis.  He states that he has been taking Tylenol and ibuprofen and lying on a heating pad without relief.  Patient was treated and evaluated in the emergency department this a.m.  Agreed with ER provider that patient was appropriate to transition to sickle cell day infusion center for pain management and extended observation.  Patient was also treated in sickle cell day infusion center on 12/18/2018 for this problem.  Pain intensity is 8/10 characterized as constant and throbbing.  Pain is primarily to low back and tailbone.  Patient denies sick contacts, recent travel, or exposure to COVID-19.  He also denies fever, chills, chest pain, shortness of breath, persistent cough, dysuria, nausea, vomiting, or diarrhea.  Meds: Medications Prior to Admission  Medication Sig Dispense Refill Last Dose  . cholecalciferol (VITAMIN D) 1000 units tablet Take 2 tablets (2,000 Units total) by mouth daily. 30 tablet 6 12/20/2018 at Unknown time  . folic acid (FOLVITE) 1 MG tablet Take 1 tablet (1 mg total) by mouth daily. 90 tablet 2 12/21/2018 at Unknown time  . gabapentin (NEURONTIN) 300 MG capsule TAKE 1 CAPSULE(300 MG) BY MOUTH THREE TIMES DAILY  (Patient taking differently: Take 300 mg by mouth 3 (three) times daily. ) 90 capsule 0 12/21/2018 at Unknown time  . hydroxyurea (HYDREA) 500 MG capsule Take 1 capsule (500 mg total) by mouth 2 (two) times daily. May take with food to minimize GI side effects. 180 capsule 2 12/21/2018 at Unknown time  . ibuprofen (ADVIL,MOTRIN) 800 MG tablet Take 1 tablet (800 mg total) by mouth 3 (three) times daily as needed (pain). 30 tablet 0 12/20/2018 at Unknown time  . Nutritional Supplements (ENSURE ACTIVE HIGH PROTEIN) LIQD Take 1 each by mouth 3 (three) times daily between meals. #90 cans of Ensure 237 mL 11 12/20/2018 at Unknown time  . oxyCODONE (OXYCONTIN) 20 mg 12 hr tablet Take 1 tablet (20 mg total) by mouth every 12 (twelve) hours. 64 tablet 0 12/20/2018 at Unknown time  . oxyCODONE-acetaminophen (PERCOCET) 10-325 MG tablet Take 1 tablet by mouth every 6 (six) hours as needed for up to 15 days for pain. 60 tablet 0 12/20/2018 at Unknown time  . aspirin EC 81 MG tablet Take 1 tablet (81 mg total) by mouth daily. (Patient not taking: Reported on 10/11/2018) 30 tablet 11   . clindamycin (CLEOCIN) 300 MG capsule Take 1 capsule (300 mg total) by mouth 4 (four) times daily. X 7 days (Patient not taking: Reported on 12/21/2018) 28 capsule 0     Allergies: Buprenorphine hcl, Levaquin [levofloxacin], Meperidine, Morphine and related, Toradol [ketorolac tromethamine], Tramadol, Vancomycin, and Zosyn [piperacillin sod-tazobactam so] Past Medical History:  Diagnosis Date  . Depression   . 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, left shoulder ([place]) y-3   associated with Hb SS  .  Sickle cell anemia (HCC)   . Vitamin D deficiency y-6   Past Surgical History:  Procedure Laterality Date  . CHOLECYSTECTOMY     Family History  Problem Relation Age of Onset  . Sickle cell trait Mother   . Sickle cell trait Father    Social History   Socioeconomic History  . Marital status: Single    Spouse name: Not on  file  . Number of children: Not on file  . Years of education: Not on file  . Highest education level: Not on file  Occupational History  . Not on file  Social Needs  . Financial resource strain: Not on file  . Food insecurity    Worry: Not on file    Inability: Not on file  . Transportation needs    Medical: Not on file    Non-medical: Not on file  Tobacco Use  . Smoking status: Never Smoker  . Smokeless tobacco: Never Used  Substance and Sexual Activity  . Alcohol use: No  . Drug use: Not Currently    Types: Marijuana  . Sexual activity: Not on file  Lifestyle  . Physical activity    Days per week: Not on file    Minutes per session: Not on file  . Stress: Not on file  Relationships  . Social Herbalist on phone: Not on file    Gets together: Not on file    Attends religious service: Not on file    Active member of club or organization: Not on file    Attends meetings of clubs or organizations: Not on file    Relationship status: Not on file  . Intimate partner violence    Fear of current or ex partner: Not on file    Emotionally abused: Not on file    Physically abused: Not on file    Forced sexual activity: Not on file  Other Topics Concern  . Not on file  Social History Narrative  . Not on file   Review of Systems  Constitutional: Negative for chills and fever.  HENT: Negative.   Eyes: Negative.   Respiratory: Negative.   Cardiovascular: Negative.   Genitourinary: Negative.   Musculoskeletal: Positive for back pain and joint pain.  Skin: Negative.   Neurological: Negative.   Psychiatric/Behavioral: Negative.      Physical Exam: Blood pressure 123/82, pulse 66, temperature 97.8 F (36.6 C), temperature source Oral, resp. rate 10, SpO2 97 %. BP 123/82 (BP Location: Right Arm)   Pulse 66   Temp 97.8 F (36.6 C) (Oral)   Resp 10   SpO2 97%   General Appearance:    Alert, cooperative, no distress, appears stated age  Head:     Normocephalic, without obvious abnormality, atraumatic  Eyes:    PERRL, conjunctiva/corneas clear, EOM's intact, fundi    benign, both eyes       Ears:    Normal TM's and external ear canals, both ears  Nose:   Nares normal, septum midline, mucosa normal, no drainage   or sinus tenderness  Throat:   Lips, mucosa, and tongue normal; teeth and gums normal  Neck:   Supple, symmetrical, trachea midline, no adenopathy;       thyroid:  No enlargement/tenderness/nodules; no carotid   bruit or JVD  Back:     Symmetric, no curvature, ROM normal, no CVA tenderness  Lungs:     Clear to auscultation bilaterally, respirations unlabored  Chest wall:  No tenderness or deformity  Heart:    Regular rate and rhythm, S1 and S2 normal, no murmur, rub   or gallop  Abdomen:     Soft, non-tender, bowel sounds active all four quadrants,    no masses, no organomegaly  Extremities:   Extremities normal, atraumatic, no cyanosis or edema  Pulses:   2+ and symmetric all extremities  Neurologic:   CNII-XII intact. Normal strength, sensation and reflexes      throughout     Lab results: Results for orders placed or performed during the hospital encounter of 12/21/18 (from the past 24 hour(s))  Comprehensive metabolic panel     Status: Abnormal   Collection Time: 12/21/18  7:16 AM  Result Value Ref Range   Sodium 139 135 - 145 mmol/L   Potassium 3.8 3.5 - 5.1 mmol/L   Chloride 106 98 - 111 mmol/L   CO2 24 22 - 32 mmol/L   Glucose, Bld 101 (H) 70 - 99 mg/dL   BUN 6 6 - 20 mg/dL   Creatinine, Ser 0.63 0.61 - 1.24 mg/dL   Calcium 9.3 8.9 - 10.3 mg/dL   Total Protein 8.2 (H) 6.5 - 8.1 g/dL   Albumin 4.7 3.5 - 5.0 g/dL   AST 24 15 - 41 U/L   ALT 9 0 - 44 U/L   Alkaline Phosphatase 70 38 - 126 U/L   Total Bilirubin 1.6 (H) 0.3 - 1.2 mg/dL   GFR calc non Af Amer >60 >60 mL/min   GFR calc Af Amer >60 >60 mL/min   Anion gap 9 5 - 15  CBC with Differential     Status: Abnormal   Collection Time: 12/21/18   7:16 AM  Result Value Ref Range   WBC 10.9 (H) 4.0 - 10.5 K/uL   RBC 3.81 (L) 4.22 - 5.81 MIL/uL   Hemoglobin 11.2 (L) 13.0 - 17.0 g/dL   HCT 32.5 (L) 39.0 - 52.0 %   MCV 85.3 80.0 - 100.0 fL   MCH 29.4 26.0 - 34.0 pg   MCHC 34.5 30.0 - 36.0 g/dL   RDW 18.9 (H) 11.5 - 15.5 %   Platelets 548 (H) 150 - 400 K/uL   nRBC 0.7 (H) 0.0 - 0.2 %   Neutrophils Relative % 63 %   Neutro Abs 7.0 1.7 - 7.7 K/uL   Lymphocytes Relative 24 %   Lymphs Abs 2.6 0.7 - 4.0 K/uL   Monocytes Relative 7 %   Monocytes Absolute 0.7 0.1 - 1.0 K/uL   Eosinophils Relative 4 %   Eosinophils Absolute 0.4 0.0 - 0.5 K/uL   Basophils Relative 1 %   Basophils Absolute 0.1 0.0 - 0.1 K/uL   Immature Granulocytes 1 %   Abs Immature Granulocytes 0.05 0.00 - 0.07 K/uL   Polychromasia PRESENT    Sickle Cells PRESENT    Target Cells PRESENT   Reticulocytes     Status: Abnormal   Collection Time: 12/21/18  7:16 AM  Result Value Ref Range   Retic Ct Pct 7.0 (H) 0.4 - 3.1 %   RBC. 3.81 (L) 4.22 - 5.81 MIL/uL   Retic Count, Absolute 268.2 (H) 19.0 - 186.0 K/uL   Immature Retic Fract 39.3 (H) 2.3 - 15.9 %    Imaging results:  No results found.   Assessment & Plan: Patient admitted to sickle cell day infusion center for management of pain crisis. IV Dilaudid via PCA per weight-based protocol.  Settings of 0.5 mg, 10-minute lockout, and  3 mg/h. Patient received Toradol in emergency department IV fluids, D5 0.45% saline at 50 mL/h Reviewed laboratory values from ER, consistent with previous. Pain intensity will be reevaluated in context of functioning and relationship to baseline as his care progresses. If pain intensity remains elevated, transition to inpatient services for higher level of care.   [name]  APRN, MSN, FNP-C [facility] [address] [place], [place] [address] [phone]  12/21/2018, 4:52 PM

## 2018-12-21 NOTE — Discharge Instructions (Signed)
Sickle Cell Anemia, Adult ° °Sickle cell anemia is a condition where your red blood cells are shaped like sickles. Red blood cells carry oxygen through the body. Sickle-shaped cells do not live as long as normal red blood cells. They also clump together and block blood from flowing through the blood vessels. This prevents the body from getting enough oxygen. Sickle cell anemia causes organ damage and pain. It also increases the risk of infection. °Follow these instructions at home: °Medicines °· Take over-the-counter and prescription medicines only as told by your doctor. °· If you were prescribed an antibiotic medicine, take it as told by your doctor. Do not stop taking the antibiotic even if you start to feel better. °· If you develop a fever, do not take medicines to lower the fever right away. Tell your doctor about the fever. °Managing pain, stiffness, and swelling °· Try these methods to help with pain: °? Use a heating pad. °? Take a warm bath. °? Distract yourself, such as by watching TV. °Eating and drinking °· Drink enough fluid to keep your pee (urine) clear or pale yellow. Drink more in hot weather and during exercise. °· Limit or avoid alcohol. °· Eat a healthy diet. Eat plenty of fruits, vegetables, whole grains, and lean protein. °· Take vitamins and supplements as told by your doctor. °Traveling °· When traveling, keep these with you: °? Your medical information. °? The names of your doctors. °? Your medicines. °· If you need to take an airplane, talk to your doctor first. °Activity °· Rest often. °· Avoid exercises that make your heart beat much faster, such as jogging. °General instructions °· Do not use products that have nicotine or tobacco, such as cigarettes and e-cigarettes. If you need help quitting, ask your doctor. °· Consider wearing a medical alert bracelet. °· Avoid being in high places (high altitudes), such as mountains. °· Avoid very hot or cold temperatures. °· Avoid places where the  temperature changes a lot. °· Keep all follow-up visits as told by your doctor. This is important. °Contact a doctor if: °· A joint hurts. °· Your feet or hands hurt or swell. °· You feel tired (fatigued). °Get help right away if: °· You have symptoms of infection. These include: °? Fever. °? Chills. °? Being very tired. °? Irritability. °? Poor eating. °? Throwing up (vomiting). °· You feel dizzy or faint. °· You have new stomach pain, especially on the left side. °· You have a an erection (priapism) that lasts more than 4 hours. °· You have numbness in your arms or legs. °· You have a hard time moving your arms or legs. °· You have trouble talking. °· You have pain that does not go away when you take medicine. °· You are short of breath. °· You are breathing fast. °· You have a long-term cough. °· You have pain in your chest. °· You have a bad headache. °· You have a stiff neck. °· Your stomach looks bloated even though you did not eat much. °· Your skin is pale. °· You suddenly cannot see well. °Summary °· Sickle cell anemia is a condition where your red blood cells are shaped like sickles. °· Follow your doctor's advice on ways to manage pain, food to eat, activities to do, and steps to take for safe travel. °· Get medical help right away if you have any signs of infection, such as a fever. °This information is not intended to replace advice given to you by   your health care provider. Make sure you discuss any questions you have with your health care provider. °Document Released: 11/21/2012 Document Revised: 05/25/2018 Document Reviewed: 03/08/2016 °Elsevier Patient Education © 2020 [employer] ° °

## 2018-12-21 NOTE — ED Provider Notes (Signed)
[facility] DEPT Provider Note   CSN: [id number] Arrival date & time: 12/21/18  0701     History   Chief Complaint Chief Complaint  Patient presents with  . Sickle Cell Pain Crisis    HPI Kaymon Licea is a 28 y.o. male.     The history is provided by the patient and medical records. No language interpreter was used.  Sickle Cell Pain Crisis    29 year old male with history of sickle cell disease, chronic pain syndrome, presenting for evaluation of back pain.  Patient's report he woke up this morning with pain throughout his back.  He described pain as a sharp stabbing achy sensation, 7 out of 10, persistent, and felt similar to sickle cell crisis in the past.  He tries taking his Percocet without any relief.  Does not complain of any associated fever chills no chest pain shortness of breath or productive cough, no nausea vomiting or diarrhea no dysuria or hematuria.  Patient states he has been compliant with his medication.  He is unaware of any triggers.  He denies any recent sickness.  Past Medical History:  Diagnosis Date  . Depression   . 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, left shoulder ([place]) y-3   associated with Hb SS  . Sickle cell anemia (HCC)   . Vitamin D deficiency y-6    Patient Active Problem List   Diagnosis Date Noted  . Abnormal liver function 11/04/2018  . Sickle cell anemia with crisis ([place]) 05/22/2018  . Acute bilateral low back pain without sciatica   . Other chronic pain   . Anemia of chronic disease   . 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 Physical deconditioning   . Agitation   . Chronic pain syndrome   . Recurrent cold sores   . Fever   . Pain   . Sickle cell crisis ([place]) 07/16/2017  . Adjustment disorder with mixed disturbance of emotions and conduct   . Constipation 08/03/2014  . 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 Protein-calorie malnutrition, severe ([place]) 04/09/2014  . Major depressive disorder, recurrent,  severe without psychotic features ([place])   . 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, left shoulder ([place])   . Vitamin D deficiency   . Sickle cell anemia ([place]) 03/30/2014  . Hyperbilirubinemia 03/30/2014  . Hypokalemia 02/07/2014  . Aggressive behavior 01/25/2013  . Sickle cell anemia with pain ([place]) 01/24/2013  . Sickle cell pain crisis ([place]) 01/24/2013    Past Surgical History:  Procedure Laterality Date  . CHOLECYSTECTOMY          Home Medications    Prior to Admission medications   Medication Sig Start Date End Date Taking? Authorizing Provider  aspirin EC 81 MG tablet Take 1 tablet (81 mg total) by mouth daily. Patient not taking: Reported on 10/11/2018 06/02/17   Dorena Dew, FNP  cholecalciferol (VITAMIN D) 1000 units tablet Take 2 tablets (2,000 Units total) by mouth daily. 12/13/17   Azzie Glatter, FNP  clindamycin (CLEOCIN) 300 MG capsule Take 1 capsule (300 mg total) by mouth 4 (four) times daily. X 7 days 10/29/18   Veryl Speak, MD  folic acid (FOLVITE) 1 MG tablet Take 1 tablet (1 mg total) by mouth daily. 05/01/17   Dorena Dew, FNP  gabapentin (NEURONTIN) 300 MG capsule TAKE 1 CAPSULE(300 MG) BY MOUTH THREE TIMES DAILY Patient taking differently: Take 300 mg by mouth 3 (three) times daily.  05/08/18   Lanae Boast, FNP  hydroxyurea (HYDREA) 500 MG  capsule Take 1 capsule (500 mg total) by mouth 2 (two) times daily. May take with food to minimize GI side effects. 05/01/17   Dorena Dew, FNP  ibuprofen (ADVIL,MOTRIN) 800 MG tablet Take 1 tablet (800 mg total) by mouth 3 (three) times daily as needed (pain). 12/20/17   Azzie Glatter, FNP  Nutritional Supplements (ENSURE ACTIVE HIGH PROTEIN) LIQD Take 1 each by mouth 3 (three) times daily between meals. #90 cans of Ensure 11/13/18   Dorena Dew, FNP  oxyCODONE (OXYCONTIN) 20 mg 12 hr tablet Take 1 tablet (20 mg total) by mouth every 12 (twelve) hours. 11/23/18 12/25/18  Azzie Glatter,  FNP  oxyCODONE-acetaminophen (PERCOCET) 10-325 MG tablet Take 1 tablet by mouth every 6 (six) hours as needed for up to 15 days for pain. 12/11/18 12/26/18  Tresa Garter, MD    Family History Family History  Problem Relation Age of Onset  . Sickle cell trait Mother   . Sickle cell trait Father     Social History Social History   Tobacco Use  . Smoking status: Never Smoker  . Smokeless tobacco: Never Used  Substance Use Topics  . Alcohol use: No  . Drug use: Not Currently    Types: Marijuana     Allergies   Buprenorphine hcl, Levaquin [levofloxacin], Meperidine, Morphine and related, Toradol [ketorolac tromethamine], Tramadol, Vancomycin, and Zosyn [piperacillin sod-tazobactam so]   Review of Systems Review of Systems  All other systems reviewed and are negative.    Physical Exam Updated Vital Signs BP 118/68 (BP Location: Left Arm)   Pulse 77   Temp 98.5 F (36.9 C) (Oral)   Resp 17   SpO2 100%   Physical Exam Vitals signs and nursing note reviewed.  Constitutional:      General: He is not in acute distress.    Appearance: He is well-developed.  HENT:     Head: Atraumatic.  Eyes:     Conjunctiva/sclera: Conjunctivae normal.  Neck:     Musculoskeletal: Neck supple.  Cardiovascular:     Rate and Rhythm: Normal rate and regular rhythm.     Pulses: Normal pulses.     Heart sounds: Normal heart sounds.  Pulmonary:     Effort: Pulmonary effort is normal.     Breath sounds: Normal breath sounds.  Abdominal:     Palpations: Abdomen is soft.     Tenderness: There is no abdominal tenderness.  Skin:    Findings: No rash.  Neurological:     Mental Status: He is alert.      ED Treatments / Results  Labs (all labs ordered are listed, but only abnormal results are displayed) Labs Reviewed  COMPREHENSIVE METABOLIC PANEL - Abnormal; Notable for the following components:      Result Value   Glucose, Bld 101 (*)    Total Protein 8.2 (*)    Total  Bilirubin 1.6 (*)    All other components within normal limits  CBC WITH DIFFERENTIAL/PLATELET - Abnormal; Notable for the following components:   WBC 10.9 (*)    RBC 3.81 (*)    Hemoglobin 11.2 (*)    HCT 32.5 (*)    RDW 18.9 (*)    Platelets 548 (*)    nRBC 0.7 (*)    All other components within normal limits  RETICULOCYTES - Abnormal; Notable for the following components:   Retic Ct Pct 7.0 (*)    RBC. 3.81 (*)    Retic Count, Absolute 268.2 (*)  Immature Retic Fract 39.3 (*)    All other components within normal limits    EKG None  Radiology No results found.  Procedures Procedures (including critical care time)  Medications Ordered in ED Medications  sodium chloride flush (NS) 0.9 % injection 3 mL (3 mLs Intravenous Given 12/21/18 0744)  ketorolac (TORADOL) 30 MG/ML injection 30 mg (30 mg Intramuscular Given 12/21/18 0750)  HYDROmorphone (DILAUDID) injection 2 mg (2 mg Intramuscular Given 12/21/18 0754)     Initial Impression / Assessment and Plan / ED Course  I have reviewed the triage vital signs and the nursing notes.  Pertinent labs & imaging results that were available during my care of the patient were reviewed by me and considered in my medical decision making (see chart for details).        BP 114/70   Pulse 68   Temp 98.5 F (36.9 C) (Oral)   Resp 16   Ht 6\' 2"  (1.88 m)   Wt 61.2 kg   SpO2 100%   BMI 17.33 kg/m    Final Clinical Impressions(s) / ED Diagnoses   Final diagnoses:  Chronic pain syndrome    ED Discharge Orders    None     9:12 AM Patient here with complaints of back pain felt similar to prior sickle cell crisis.  Labs are reassuring.  Patient received initial dose of pain medication including 2 mg of Dilaudid and as well as 30 mg of Toradol without adequate relief.  Appreciate consultation from sickle cell day Hospital provider who agrees to accept patient for further management of his significant.   Domenic Moras, PA-C  12/21/18 [id number]    Wyvonnia Dusky, MD 12/22/18 1150

## 2018-12-21 NOTE — ED Notes (Addendum)
Gertie Fey PA at bedside updating pt on POC for DC to [facility] clinic

## 2018-12-21 NOTE — Progress Notes (Signed)
Patient admitted to the day infusion hospital from [facility] for sickle cell pain. Initially, patient reported back pain rated 8/10. For pain management, patient placed on Dilaudid PCA and hydrated with IV fluids. At discharge, patient rated pain at 6/10. Vital signs stable. Discharge instructions given. Patient alert, oriented and ambulatory at discharge.

## 2018-12-24 ENCOUNTER — Other Ambulatory Visit: Payer: Self-pay | Admitting: Family Medicine

## 2018-12-24 ENCOUNTER — Telehealth: Payer: Self-pay | Admitting: Internal Medicine

## 2018-12-24 ENCOUNTER — Telehealth: Payer: Self-pay

## 2018-12-24 DIAGNOSIS — D571 Sickle-cell disease without crisis: Secondary | ICD-10-CM

## 2018-12-24 MED ORDER — OXYCODONE HCL ER 20 MG PO T12A: 20.0000 mg | EXTENDED_RELEASE_TABLET | Freq: Two times a day (BID) | ORAL | 0 refills | Status: DC

## 2018-12-24 MED ORDER — OXYCODONE-ACETAMINOPHEN 10-325 MG PO TABS: 1.0000 | ORAL_TABLET | Freq: Four times a day (QID) | ORAL | 0 refills | Status: DC | PRN

## 2018-12-24 NOTE — Progress Notes (Signed)
Timothy Marks, a 28 year old male with a medical history significant for sickle cell disease, chronic pain syndrome, opiate tolerance, and opiate dependence is requesting refills on pain medication. Reviewed PDMP, patient's medications are not to be filled prior to 12/27/2018 without exceptions. Patient will need a first available appointment for medication management.    Meds ordered this encounter  Medications  . oxyCODONE (OXYCONTIN) 20 mg 12 hr tablet    Sig: Take 1 tablet (20 mg total) by mouth every 12 (twelve) hours.    Dispense:  60 tablet    Refill:  0    Not to be received prior to 12/27/2018    Order Specific Question:   Supervising Provider    Answer:   Tresa Garter [id number]  . oxyCODONE-acetaminophen (PERCOCET) 10-325 MG tablet    Sig: Take 1 tablet by mouth every 6 (six) hours as needed for up to 15 days for pain.    Dispense:  60 tablet    Refill:  0    Refill is not to be received prior to 12/27/2018    Order Specific Question:   Supervising Provider    Answer:   Tresa Garter [id number]   [place], MSN, FNP-C [facility] [address] [place], [place] [address] [phone]

## 2018-12-24 NOTE — Telephone Encounter (Signed)
Patient called in to request a refill for oxycodone 20mg . He says is due tomorrow. Please advise. Thanks !

## 2018-12-26 ENCOUNTER — Telehealth (HOSPITAL_COMMUNITY): Payer: Self-pay | Admitting: General Practice

## 2018-12-26 ENCOUNTER — Other Ambulatory Visit: Payer: Self-pay

## 2018-12-26 ENCOUNTER — Emergency Department (HOSPITAL_COMMUNITY)
Admission: EM | Admit: 2018-12-26 | Discharge: 2018-12-26 | Discharge status: Against Medical Advice | Payer: Medicaid Other | Attending: Emergency Medicine | Admitting: Emergency Medicine

## 2018-12-26 DIAGNOSIS — D5700 Hb-SS disease with crisis, unspecified: Secondary | ICD-10-CM | POA: Diagnosis present

## 2018-12-26 DIAGNOSIS — Z5321 Procedure and treatment not carried out due to patient leaving prior to being seen by health care provider: Secondary | ICD-10-CM | POA: Diagnosis not present

## 2018-12-26 NOTE — ED Notes (Signed)
Pt stated he was leaving. Pt has left.

## 2018-12-26 NOTE — ED Triage Notes (Signed)
Pt endorses having back pain and has sickle cell. VSS. Denies CP or shob.

## 2018-12-26 NOTE — Telephone Encounter (Signed)
done

## 2018-12-26 NOTE — Telephone Encounter (Signed)
Patient called from [facility] ER. Complained of pain in the    back rated at 8/10. Denied chest pain, fever, diarrhea, abdominal pain, nausea/vomitting. Screened negative for Covid-19 symptoms. Admitted to having means of transportation without driving self after treatment. Last took "Tylenol" at 18:00 yesterday. Patient "ran out" of prescribed pain medications. Refill is not due until "tomorrow".  Per provider, if patient comes to the day hospital, home medications and hydration will be done. Patient decided to remain at the ER without coming to the day hospital.

## 2018-12-26 NOTE — ED Notes (Signed)
Pt denies blood draw, states only wants to be stuck once and will wait until pt get to the back.

## 2019-01-08 ENCOUNTER — Other Ambulatory Visit: Payer: Self-pay | Admitting: Internal Medicine

## 2019-01-08 ENCOUNTER — Telehealth: Payer: Self-pay | Admitting: Internal Medicine

## 2019-01-08 ENCOUNTER — Ambulatory Visit: Payer: Self-pay | Admitting: Family Medicine

## 2019-01-08 DIAGNOSIS — D571 Sickle-cell disease without crisis: Secondary | ICD-10-CM

## 2019-01-08 MED ORDER — OXYCODONE-ACETAMINOPHEN 10-325 MG PO TABS: 1.0000 | ORAL_TABLET | Freq: Four times a day (QID) | ORAL | 0 refills | Status: DC | PRN

## 2019-01-08 NOTE — Telephone Encounter (Signed)
Refilled for 01/11/2019

## 2019-01-15 ENCOUNTER — Other Ambulatory Visit: Payer: Self-pay

## 2019-01-15 ENCOUNTER — Ambulatory Visit (INDEPENDENT_AMBULATORY_CARE_PROVIDER_SITE_OTHER): Payer: Medicaid Other | Admitting: Family Medicine

## 2019-01-15 ENCOUNTER — Encounter: Payer: Self-pay | Admitting: Family Medicine

## 2019-01-15 VITALS — BP 118/72 | HR 84 | Temp 98.6°F | Resp 14 | Ht 74.0 in | Wt 123.0 lb

## 2019-01-15 DIAGNOSIS — D571 Sickle-cell disease without crisis: Secondary | ICD-10-CM | POA: Diagnosis not present

## 2019-01-15 DIAGNOSIS — G894 Chronic pain syndrome: Secondary | ICD-10-CM | POA: Diagnosis not present

## 2019-01-15 LAB — POCT URINALYSIS DIPSTICK
Bilirubin, UA: NEGATIVE
Glucose, UA: NEGATIVE
Ketones, UA: NEGATIVE
Leukocytes, UA: NEGATIVE
Nitrite, UA: NEGATIVE
Protein, UA: POSITIVE — AB
Spec Grav, UA: 1.015 (ref 1.010–1.025)
Urobilinogen, UA: 4 E.U./dL — AB
pH, UA: 8.5 — AB (ref 5.0–8.0)

## 2019-01-15 NOTE — Progress Notes (Signed)
Established Patient Office Visit  Subjective:  Patient ID: Timothy Marks, male    DOB: [birth year]  Age: 28 y.o. MRN: [id number]  CC:  Chief Complaint  Patient presents with  . Sickle Cell Anemia    HPI Timothy Marks a 28 year old male with a medical history significant for sickle cell disease, chronic pain syndrome, opiate dependence, opiate tolerance, history of anemia of chronic disease, and history of oppositional defiant disorder presents for a follow up of sickle cell disease, chronic pain, and medication management. Patient has frequent chronic pain exacerbations related to sickle cell disease. He says that pain medication regimen has been effective. He says that he is not having pain on today. He says that he last had oxycontin on yesterday. He says that he is without complaint. Denies fever, chills, sick contacts, or exposure to COVID 19.  He also denies headache, chest pain, shortness of breath, dizziness, paresthesias, urinary symptoms, nausea, vomiting, or diarrhea.  Past Medical History:  Diagnosis Date  . Depression   . 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 Sickle cell anemia (HCC)   . Vitamin D deficiency y-6    Past Surgical History:  Procedure Laterality Date  . CHOLECYSTECTOMY      Family History  Problem Relation Age of Onset  . Sickle cell trait Mother   . Sickle cell trait Father     Social History   Socioeconomic History  . Marital status: Single    Spouse name: Not on file  . Number of children: Not on file  . Years of education: Not on file  . Highest education level: Not on file  Occupational History  . Not on file  Social Needs  . Financial resource strain: Not on file  . Food insecurity    Worry: Not on file    Inability: Not on file  . Transportation needs    Medical: Not on file    Non-medical: Not on file  Tobacco Use  . Smoking status: Never Smoker  . Smokeless tobacco:  Never Used  Substance and Sexual Activity  . Alcohol use: No  . Drug use: Not Currently    Types: Marijuana  . Sexual activity: Not on file  Lifestyle  . Physical activity    Days per week: Not on file    Minutes per session: Not on file  . Stress: Not on file  Relationships  . Social Herbalist on phone: Not on file    Gets together: Not on file    Attends religious service: Not on file    Active member of club or organization: Not on file    Attends meetings of clubs or organizations: Not on file    Relationship status: Not on file  . Intimate partner violence    Fear of current or ex partner: Not on file    Emotionally abused: Not on file    Physically abused: Not on file    Forced sexual activity: Not on file  Other Topics Concern  . Not on file  Social History Narrative  . Not on file    Outpatient Medications Prior to Visit  Medication Sig Dispense Refill  . cholecalciferol (VITAMIN D) 1000 units tablet Take 2 tablets (2,000 Units total) by mouth daily. 30 tablet 6  . folic acid (FOLVITE) 1 MG tablet Take 1 tablet (1 mg total) by mouth daily. 90 tablet 2  . gabapentin (NEURONTIN)  300 MG capsule TAKE 1 CAPSULE(300 MG) BY MOUTH THREE TIMES DAILY (Patient taking differently: Take 300 mg by mouth 3 (three) times daily. ) 90 capsule 0  . hydroxyurea (HYDREA) 500 MG capsule Take 1 capsule (500 mg total) by mouth 2 (two) times daily. May take with food to minimize GI side effects. 180 capsule 2  . ibuprofen (ADVIL,MOTRIN) 800 MG tablet Take 1 tablet (800 mg total) by mouth 3 (three) times daily as needed (pain). 30 tablet 0  . Nutritional Supplements (ENSURE ACTIVE HIGH PROTEIN) LIQD Take 1 each by mouth 3 (three) times daily between meals. #90 cans of Ensure 237 mL 11  . oxyCODONE (OXYCONTIN) 20 mg 12 hr tablet Take 1 tablet (20 mg total) by mouth every 12 (twelve) hours. 60 tablet 0  . oxyCODONE-acetaminophen (PERCOCET) 10-325 MG tablet Take 1 tablet by mouth every 6  (six) hours as needed for up to 15 days for pain. 60 tablet 0  . aspirin EC 81 MG tablet Take 1 tablet (81 mg total) by mouth daily. (Patient not taking: Reported on 10/11/2018) 30 tablet 11  . clindamycin (CLEOCIN) 300 MG capsule Take 1 capsule (300 mg total) by mouth 4 (four) times daily. X 7 days (Patient not taking: Reported on 12/21/2018) 28 capsule 0   No facility-administered medications prior to visit.     Allergies  Allergen Reactions  . Buprenorphine Hcl Hives  . Levaquin [Levofloxacin] Itching  . Meperidine Rash  . Morphine And Related Hives  . Toradol [Ketorolac Tromethamine] Itching  . Tramadol Hives  . Vancomycin Itching  . Zosyn [Piperacillin Sod-Tazobactam So] Itching and Rash    Has taken rocephin in past    ROS Review of Systems  Constitutional: Positive for unexpected weight change (Weight loss). Negative for activity change and appetite change.  HENT: Negative.   Eyes: Negative.   Respiratory: Negative.   Cardiovascular: Negative.   Gastrointestinal: Negative.   Endocrine: Negative for polydipsia, polyphagia and polyuria.  Genitourinary: Negative.   Musculoskeletal: Positive for arthralgias and back pain.  Allergic/Immunologic: Negative.   Neurological: Negative.   Hematological: Negative.   Psychiatric/Behavioral: Negative.       Objective:    Physical Exam  Constitutional: He is oriented to person, place, and time. He appears well-developed and well-nourished.  HENT:  Head: Normocephalic and atraumatic.  Eyes: Pupils are equal, round, and reactive to light.  Neck: Normal range of motion.  Cardiovascular: Normal rate, regular rhythm and normal heart sounds.  Pulmonary/Chest: Effort normal and breath sounds normal.  Abdominal: Soft. Bowel sounds are normal.  Musculoskeletal: Normal range of motion.  Neurological: He is alert and oriented to person, place, and time.  Skin: Skin is warm and dry.  Psychiatric: He has a normal mood and affect. His  behavior is normal. Judgment and thought content normal.  Flat affect    BP 118/72 (BP Location: Left Arm, Patient Position: Sitting, Cuff Size: Normal)   Pulse 84   Temp 98.6 F (37 C) (Oral)   Resp 14   Ht 6\' 2"  (1.88 m)   Wt 123 lb (55.8 kg)   SpO2 100%   BMI 15.79 kg/m  Wt Readings from Last 3 Encounters:  01/15/19 123 lb (55.8 kg)  12/26/18 135 lb (61.2 kg)  12/21/18 135 lb (61.2 kg)      Lab Results  Component Value Date   TSH 0.884 08/31/2018   Lab Results  Component Value Date   WBC 10.9 (H) 12/21/2018   HGB 11.2 (  L) 12/21/2018   HCT 32.5 (L) 12/21/2018   MCV 85.3 12/21/2018   PLT 548 (H) 12/21/2018   Lab Results  Component Value Date   NA 139 12/21/2018   K 3.8 12/21/2018   CO2 24 12/21/2018   GLUCOSE 101 (H) 12/21/2018   BUN 6 12/21/2018   CREATININE 0.63 12/21/2018   BILITOT 1.6 (H) 12/21/2018   ALKPHOS 70 12/21/2018   AST 24 12/21/2018   ALT 9 12/21/2018   PROT 8.2 (H) 12/21/2018   ALBUMIN 4.7 12/21/2018   CALCIUM 9.3 12/21/2018   ANIONGAP 9 12/21/2018   No results found for: CHOL No results found for: HDL No results found for: LDLCALC No results found for: TRIG No results found for: CHOLHDL No results found for: HGBA1C    Assessment & Plan:   Problem List Items Addressed This Visit      Other   Sickle cell anemia (HCC) - Primary (Chronic)   Relevant Orders   Urinalysis Dipstick (Completed)   [id number] 11+Oxyco+Alc+Crt-Bund   Chronic pain syndrome   Relevant Orders   [id number] 11+Oxyco+Alc+Crt-Bund     Hb-SS disease without crisis ([place]) Reviewed previous labs, consistent with patient's baseline and did not warrant repeating on today. Reviewed PDMP, opiate medication reporting system and that there were no inconsistencies noted.  Patient says that he has been taking folic acid and hydroxyurea daily without interruption.  His last pain crisis was 3 weeks ago.  Patient advised to continue 64 ounces of fluid per day to prevent sickle cell  crisis.  Patient is up-to-date with immunizations.  He has not had a recent eye exam, he has no showed the past several ophthalmology appointments.  Will not send another referral at this time. Patient will follow-up in office in around 2 months and repeat labs including CBC, reticulocytes, CMP, and vitamin D. - Urinalysis Dipstick [id number] 11+Oxyco+Alc+Crt-Bund - Cannabinoid Conf, Ur - Oxycodone/Oxymorphone, Confirm  Chronic pain syndrome Will review urine drug screen as results become available.  Discussed medication regimen at length.  Discussed the fact that previous drug screen was negative for opiates.  In order to continue to prescribe patient's current pain medication regimen urine drug screens will have to be negative for illicit substances and contain opiate metabolites.  Patient expressed understanding and says that he has been taking medications consistently. [id number] 11+Oxyco+Alc+Crt-Bund   Follow-up: Return in about 2 months (around 03/18/2019) for sickle cell anemia.     [name]  APRN, MSN, FNP-C Patient [facility] [address] [place], [place] [address] [phone]

## 2019-01-19 LAB — DRUG SCREEN 764883 11+OXYCO+ALC+CRT-BUND
Amphetamines, Urine: NEGATIVE ng/mL
BENZODIAZ UR QL: NEGATIVE ng/mL
Barbiturate: NEGATIVE ng/mL
Cocaine (Metabolite): NEGATIVE ng/mL
Creatinine: 64.4 mg/dL (ref 20.0–300.0)
Ethanol: NEGATIVE %
Meperidine: NEGATIVE ng/mL
Methadone Screen, Urine: NEGATIVE ng/mL
OPIATE SCREEN URINE: NEGATIVE ng/mL
Phencyclidine: NEGATIVE ng/mL
Propoxyphene: NEGATIVE ng/mL
Tramadol: NEGATIVE ng/mL
pH, Urine: 8 (ref 4.5–8.9)

## 2019-01-19 LAB — CANNABINOID CONFIRMATION, UR
CANNABINOIDS: POSITIVE — AB
Carboxy THC GC/MS Conf: >300 ng/mL

## 2019-01-19 LAB — OXYCODONE/OXYMORPHONE, CONFIRM: OXYCODONE/OXYMORPH: NEGATIVE

## 2019-01-21 ENCOUNTER — Telehealth: Payer: Self-pay | Admitting: Internal Medicine

## 2019-01-21 ENCOUNTER — Encounter: Payer: Self-pay | Admitting: Family Medicine

## 2019-01-21 NOTE — Progress Notes (Unsigned)
Timothy Marks, a 28 year old male with a medical history significant for sickle cell disease type SS, chronic pain syndrome, opiate dependence and tolerance, history of anemia of chronic disease, and history of oppositional defiance disorder requested prescription for oxycodone and OxyContin that is due on 01/26/2019.  Reviewed urine drug screen from previous office visit, there are no drug metabolites in urine.  Individuals metabolize medications differently, but patient is on long acting OxyContin.  He says that he takes medication every 12 hours, however UDS is not consistent with this statement.  Urine drug screen is positive for cannabinoids.  Urine drug screen from 11/13/2018 was exactly the same.  Patient has been given multiple opportunities with providers at this clinic.  This clinic will no longer be able to prescribe opiate medications for this patient.  I will send a referral to pain management.  This clinic can continue to follow the patient for sickle cell disease and other primary care needs.   [name]  APRN, MSN, FNP-C Patient [name], [place] [address] [phone]

## 2019-01-22 NOTE — Telephone Encounter (Signed)
done

## 2019-01-26 ENCOUNTER — Other Ambulatory Visit: Payer: Self-pay | Admitting: Family Medicine

## 2019-01-26 ENCOUNTER — Emergency Department (HOSPITAL_COMMUNITY)
Admission: EM | Admit: 2019-01-26 | Discharge: 2019-01-26 | Disposition: A | Discharge status: Home / Self Care | Payer: Medicaid Other | Attending: Emergency Medicine | Admitting: Emergency Medicine

## 2019-01-26 ENCOUNTER — Other Ambulatory Visit: Payer: Self-pay

## 2019-01-26 ENCOUNTER — Encounter (HOSPITAL_COMMUNITY): Payer: Self-pay | Admitting: Emergency Medicine

## 2019-01-26 DIAGNOSIS — G894 Chronic pain syndrome: Secondary | ICD-10-CM

## 2019-01-26 DIAGNOSIS — Z79899 Other long term (current) drug therapy: Secondary | ICD-10-CM | POA: Insufficient documentation

## 2019-01-26 DIAGNOSIS — D571 Sickle-cell disease without crisis: Secondary | ICD-10-CM

## 2019-01-26 DIAGNOSIS — D5700 Hb-SS disease with crisis, unspecified: Secondary | ICD-10-CM | POA: Insufficient documentation

## 2019-01-26 LAB — CBC WITH DIFFERENTIAL/PLATELET
Abs Immature Granulocytes: 0.04 10*3/uL (ref 0.00–0.07)
Basophils Absolute: 0 10*3/uL (ref 0.0–0.1)
Basophils Relative: 0 %
Eosinophils Absolute: 0.4 10*3/uL (ref 0.0–0.5)
Eosinophils Relative: 4 %
HCT: 29.6 % — ABNORMAL LOW (ref 39.0–52.0)
Hemoglobin: 10.3 g/dL — ABNORMAL LOW (ref 13.0–17.0)
Immature Granulocytes: 0 %
Lymphocytes Relative: 20 %
Lymphs Abs: 2 10*3/uL (ref 0.7–4.0)
MCH: 30.6 pg (ref 26.0–34.0)
MCHC: 34.8 g/dL (ref 30.0–36.0)
MCV: 87.8 fL (ref 80.0–100.0)
Monocytes Absolute: 0.9 10*3/uL (ref 0.1–1.0)
Monocytes Relative: 9 %
Neutro Abs: 6.5 10*3/uL (ref 1.7–7.7)
Neutrophils Relative %: 67 %
Platelets: 450 10*3/uL — ABNORMAL HIGH (ref 150–400)
RBC: 3.37 MIL/uL — ABNORMAL LOW (ref 4.22–5.81)
RDW: 18.1 % — ABNORMAL HIGH (ref 11.5–15.5)
WBC: 9.8 10*3/uL (ref 4.0–10.5)
nRBC: 1.5 % — ABNORMAL HIGH (ref 0.0–0.2)

## 2019-01-26 LAB — RETICULOCYTES
Immature Retic Fract: 39.9 % — ABNORMAL HIGH (ref 2.3–15.9)
RBC.: 3.37 MIL/uL — ABNORMAL LOW (ref 4.22–5.81)
Retic Count, Absolute: 373.4 10*3/uL — ABNORMAL HIGH (ref 19.0–186.0)
Retic Ct Pct: 11.1 % — ABNORMAL HIGH (ref 0.4–3.1)

## 2019-01-26 LAB — COMPREHENSIVE METABOLIC PANEL
ALT: 12 U/L (ref 0–44)
AST: 26 U/L (ref 15–41)
Albumin: 4.6 g/dL (ref 3.5–5.0)
Alkaline Phosphatase: 60 U/L (ref 38–126)
Anion gap: 8 (ref 5–15)
BUN: 6 mg/dL (ref 6–20)
CO2: 27 mmol/L (ref 22–32)
Calcium: 9.5 mg/dL (ref 8.9–10.3)
Chloride: 104 mmol/L (ref 98–111)
Creatinine, Ser: 0.53 mg/dL — ABNORMAL LOW (ref 0.61–1.24)
GFR calc Af Amer: >60 mL/min (ref >60)
GFR calc non Af Amer: >60 mL/min (ref >60)
Glucose, Bld: 94 mg/dL (ref 70–99)
Potassium: 4.4 mmol/L (ref 3.5–5.1)
Sodium: 139 mmol/L (ref 135–145)
Total Bilirubin: 2.5 mg/dL — ABNORMAL HIGH (ref 0.3–1.2)
Total Protein: 7.7 g/dL (ref 6.5–8.1)

## 2019-01-26 MED ORDER — HYDROMORPHONE HCL 2 MG/ML IJ SOLN: 2.0000 mg | INTRAMUSCULAR | Status: AC

## 2019-01-26 MED ORDER — OXYCODONE-ACETAMINOPHEN 10-325 MG PO TABS: 1.0000 | ORAL_TABLET | ORAL | 0 refills | Status: DC | PRN

## 2019-01-26 MED ORDER — HYDROMORPHONE HCL 2 MG/ML IJ SOLN
2.0000 mg | INTRAMUSCULAR | Status: AC
  Administered 2019-01-26: 14:00:00 2 mg via INTRAVENOUS
  Filled 2019-01-26: qty 1

## 2019-01-26 MED ORDER — OXYCODONE HCL ER 20 MG PO T12A: 20.0000 mg | EXTENDED_RELEASE_TABLET | Freq: Two times a day (BID) | ORAL | 0 refills | Status: DC

## 2019-01-26 MED ORDER — PROMETHAZINE HCL 25 MG PO TABS
25.0000 mg | ORAL_TABLET | ORAL | Status: DC | PRN
  Administered 2019-01-26: 13:00:00 25 mg via ORAL
  Filled 2019-01-26: qty 1

## 2019-01-26 MED ORDER — SODIUM CHLORIDE 0.45 % IV SOLN
INTRAVENOUS | Status: DC
  Administered 2019-01-26: 13:00:00 via INTRAVENOUS

## 2019-01-26 MED ORDER — HYDROMORPHONE HCL 2 MG/ML IJ SOLN
2.0000 mg | INTRAMUSCULAR | Status: AC
  Administered 2019-01-26: 13:00:00 2 mg via INTRAVENOUS
  Filled 2019-01-26: qty 1

## 2019-01-26 MED ORDER — PROMETHAZINE HCL 25 MG/ML IJ SOLN
6.2500 mg | Freq: Four times a day (QID) | INTRAMUSCULAR | Status: DC | PRN
  Administered 2019-01-26: 17:00:00 6.25 mg via INTRAVENOUS
  Filled 2019-01-26: qty 1

## 2019-01-26 MED ORDER — KETOROLAC TROMETHAMINE 30 MG/ML IJ SOLN
30.0000 mg | INTRAMUSCULAR | Status: DC
  Filled 2019-01-26: qty 1

## 2019-01-26 MED ORDER — DIPHENHYDRAMINE HCL 25 MG PO CAPS
25.0000 mg | ORAL_CAPSULE | ORAL | Status: DC | PRN
  Administered 2019-01-26: 25 mg via ORAL
  Filled 2019-01-26: qty 1

## 2019-01-26 MED ORDER — HYDROMORPHONE HCL 1 MG/ML IJ SOLN
1.0000 mg | Freq: Once | INTRAMUSCULAR | Status: AC
  Administered 2019-01-26: 17:00:00 1 mg via INTRAVENOUS
  Filled 2019-01-26: qty 1

## 2019-01-26 NOTE — Progress Notes (Signed)
Meds ordered this encounter  Medications  . oxyCODONE (OXYCONTIN) 20 mg 12 hr tablet    Sig: Take 1 tablet (20 mg total) by mouth every 12 (twelve) hours.    Dispense:  6 tablet    Refill:  0    Not to be received prior to 12/27/2018    Order Specific Question:   Supervising Provider    Answer:   Tresa Garter [id number]  . oxyCODONE-acetaminophen (PERCOCET) 10-325 MG tablet    Sig: Take 1 tablet by mouth every 4 (four) hours as needed for up to 15 days for pain.    Dispense:  18 tablet    Refill:  0    Order Specific Question:   Supervising Provider    Answer:   Tresa Garter [id number]    [name]  APRN, MSN, FNP-C Patient [facility] [address] [place], [place] [address] [phone]

## 2019-01-26 NOTE — ED Provider Notes (Signed)
I assumed care of patient from Domenic Moras PA-C please see his note for full H and P.   Briefly patient is here for a sickle cell pain crisis.   Physical Exam  BP 113/68 (BP Location: Left Arm)   Pulse 78   Temp 98.3 F (36.8 C) (Oral)   Resp 16   SpO2 100%   Physical Exam Vitals and nursing note reviewed.  Constitutional:      General: He is not in acute distress.    Appearance: He is well-developed. He is not diaphoretic.  Cardiovascular:     Rate and Rhythm: Normal rate.  Pulmonary:     Effort: Pulmonary effort is normal. No respiratory distress.     Breath sounds: No stridor.  Abdominal:     General: There is no distension.  Skin:    General: Skin is warm and dry.  Neurological:     Mental Status: He is alert.     Motor: No abnormal muscle tone.     Comments: Speech is normal.  Able to answer questions.  Psychiatric:        Mood and Affect: Mood normal.        Behavior: Behavior normal.     ED Course/Procedures     Procedures  MDM  Plan is to follow-up on recommendations by sickle cell team.  Patient seen by Lovett Sox FNP.  Per her note She has made changes to his pain medication and recommended outpatient follow up with discharge at this time.    Return precautions were discussed with patient who states their understanding.  At the time of discharge patient denied any unaddressed complaints or concerns.  Patient is agreeable for discharge home.       Lorin Glass, PA-C 01/26/19 1729    Daleen Bo, MD 01/27/19 1032

## 2019-01-26 NOTE — Consult Note (Signed)
Reason for Consult:Possible sickle cell pain crisis admission Referring Physician: Domenic Moras, PA-C  History of present illness: Timothy Marks is an 28 y.o. male with a medical history significant for sickle cell disease type SS, chronic pain syndrome, opiate dependence and tolerance, history of anemia of chronic disease, and history of oppositional defiance disorder presents complaining of pain primarily to mid chest and lower back that is consistent with his typical pain crisis. Patient attributes current pain crisis to increased stressors, changes in weather, and being out of home medications.  Patient's home medications are prescribed by [facility] health patient care center.  Current pain intensity is 8/10, constant, and aching.  Patient did not have any medications to alleviate crisis prior to arrival.  He denies headache, shortness of breath, persistent cough, sore throat, urinary symptoms, nausea, vomiting, or diarrhea.  No fever, chills, recent travel, sick contacts, or exposure to COVID-19.   Past Medical History:  Diagnosis Date  . Depression   . 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 Sickle cell anemia (HCC)   . Vitamin D deficiency y-6    Past Surgical History:  Procedure Laterality Date  . CHOLECYSTECTOMY      Family History  Problem Relation Age of Onset  . Sickle cell trait Mother   . Sickle cell trait Father     Social History:  reports that he has never smoked. He has never used smokeless tobacco. He reports previous drug use. Drug: Marijuana. He reports that he does not drink alcohol.  Allergies:  Allergies  Allergen Reactions  . Buprenorphine Hcl Hives  . Levaquin [Levofloxacin] Itching  . Meperidine Rash  . Morphine Hives  . Morphine And Related Hives  . Toradol [Ketorolac Tromethamine] Itching  . Tramadol Hives  . Vancomycin Itching  . Zosyn [Piperacillin Sod-Tazobactam So] Itching and Rash    Has  taken rocephin in past     Results for orders placed or performed during the hospital encounter of 01/26/19 (from the past 48 hour(s))  Comprehensive metabolic panel     Status: Abnormal   Collection Time: 01/26/19 10:56 AM  Result Value Ref Range   Sodium 139 135 - 145 mmol/L   Potassium 4.4 3.5 - 5.1 mmol/L   Chloride 104 98 - 111 mmol/L   CO2 27 22 - 32 mmol/L   Glucose, Bld 94 70 - 99 mg/dL   BUN 6 6 - 20 mg/dL   Creatinine, Ser 0.53 (L) 0.61 - 1.24 mg/dL   Calcium 9.5 8.9 - 10.3 mg/dL   Total Protein 7.7 6.5 - 8.1 g/dL   Albumin 4.6 3.5 - 5.0 g/dL   AST 26 15 - 41 U/L   ALT 12 0 - 44 U/L   Alkaline Phosphatase 60 38 - 126 U/L   Total Bilirubin 2.5 (H) 0.3 - 1.2 mg/dL   GFR calc non Af Amer >60 >60 mL/min   GFR calc Af Amer >60 >60 mL/min   Anion gap 8 5 - 15    Comment: Performed at [facility], [place] [address], [place], [place] [address]  CBC with Differential     Status: Abnormal   Collection Time: 01/26/19 10:56 AM  Result Value Ref Range   WBC 9.8 4.0 - 10.5 K/uL    Comment: WHITE COUNT CONFIRMED ON SMEAR   RBC 3.37 (L) 4.22 - 5.81 MIL/uL   Hemoglobin 10.3 (L) 13.0 - 17.0 g/dL   HCT 29.6 (L)  39.0 - 52.0 %   MCV 87.8 80.0 - 100.0 fL   MCH 30.6 26.0 - 34.0 pg   MCHC 34.8 30.0 - 36.0 g/dL   RDW 18.1 (H) 11.5 - 15.5 %   Platelets 450 (H) 150 - 400 K/uL   nRBC 1.5 (H) 0.0 - 0.2 %   Neutrophils Relative % 67 %   Neutro Abs 6.5 1.7 - 7.7 K/uL   Lymphocytes Relative 20 %   Lymphs Abs 2.0 0.7 - 4.0 K/uL   Monocytes Relative 9 %   Monocytes Absolute 0.9 0.1 - 1.0 K/uL   Eosinophils Relative 4 %   Eosinophils Absolute 0.4 0.0 - 0.5 K/uL   Basophils Relative 0 %   Basophils Absolute 0.0 0.0 - 0.1 K/uL   Immature Granulocytes 0 %   Abs Immature Granulocytes 0.04 0.00 - 0.07 K/uL   Reactive, Benign Lymphocytes PRESENT    Polychromasia PRESENT    Sickle Cells PRESENT    Target Cells PRESENT     Comment: Performed at [facility],  [place] [address] [place], [place] [address]  Reticulocytes     Status: Abnormal   Collection Time: 01/26/19 10:56 AM  Result Value Ref Range   Retic Ct Pct 11.1 (H) 0.4 - 3.1 %   RBC. 3.37 (L) 4.22 - 5.81 MIL/uL   Retic Count, Absolute 373.4 (H) 19.0 - 186.0 K/uL   Immature Retic Fract 39.9 (H) 2.3 - 15.9 %    Comment: Performed at [facility], [place] [address], [place], [place] [address]    No results found.  Review of Systems  Constitutional: Negative.  Negative for fatigue and fever.  HENT: Negative.   Eyes: Negative.   Respiratory: Negative.   Cardiovascular: Negative.   Gastrointestinal: Negative.   Endocrine: Negative.   Genitourinary: Negative.   Musculoskeletal: Positive for back pain and myalgias.       Mid chest, sternum  Allergic/Immunologic: Negative.   Neurological: Negative.   Psychiatric/Behavioral: Negative.  Negative for suicidal ideas.   Blood pressure 113/68, pulse 78, temperature 98.3 F (36.8 C), temperature source Oral, resp. rate 16, SpO2 100 %. Physical Exam  Constitutional: He is oriented to person, place, and time. He appears well-developed and well-nourished.  HENT:  Head: Normocephalic and atraumatic.  Eyes: Pupils are equal, round, and reactive to light. Scleral icterus is present.  Cardiovascular: Normal rate and regular rhythm.  Respiratory: Effort normal.  GI: Soft. Bowel sounds are normal.  Musculoskeletal:     Cervical back: Normal range of motion.  Neurological: He is alert and oriented to person, place, and time. He has normal reflexes.  Skin: Skin is warm.    Assessment/Plan: Sickle cell disease type SS with pain versus chronic pain exacerbation: Reviewed patient's labs, consistent with baseline.  Hemoglobin is 10.0, there is no clinical indication for blood transfusion or further work-up or evaluation.  Patient is hemodynamically stable.  He continues to complain of increased pain and is out of home pain  medications.  Percocet 10-325 mg every 4 hours has been sent to patient's pharmacy #[id number], patient is to take medication as needed for moderate to severe breakthrough pain.  The frequency of patient's Percocet has been increased to prevent sickle cell crisis.  Also, OxyContin 20 mg every 12 hours #6 has been sent to patient's pharmacy for his ongoing chronic pain. Patient will follow-up at the sickle cell day infusion center on 01/28/2019 for a treatment.  At that time, we will discuss  outpatient pain management plan. Patient advised to hydrate with 64 ounces of water.  Recommend ibuprofen 800 every 8 hours with food. Continue maintenance medications including folic acid 1 mg daily and hydroxyurea as prescribed. On 01/28/2019, labs will be ready collected and reevaluated at that time. He will be evaluated by this provider at sickle cell day infusion center.   Patient alert, oriented, ambulating without assistance.  He will discharge home with family in a hemodynamically stable condition.   [name]  APRN, MSN, FNP-C Patient [facility] [address] [place], [place] [address] [phone]   01/26/2019, 4:46 PM

## 2019-01-26 NOTE — ED Triage Notes (Signed)
Pt c/o sickle cell pain in back since Thursday.

## 2019-01-26 NOTE — Discharge Instructions (Addendum)
Please follow up with the sickle cell clinic as discussed.   Today you received medications that may make you sleepy or impair your ability to make decisions.  For the next 24 hours please do not drive, operate heavy machinery, care for a small child with out another adult present, or perform any activities that may cause harm to you or someone else if you were to fall asleep or be impaired.   You are being prescribed a medication which may make you sleepy. Please follow up of listed precautions for at least 24 hours after taking one dose.

## 2019-01-26 NOTE — ED Provider Notes (Signed)
[facility] DEPT Provider Note   CSN: [id number] Arrival date & time: 01/26/19  [id number]     History Chief Complaint  Patient presents with  . Sickle Cell Pain Crisis  . Back Pain    Timothy Marks is a 28 y.o. male.  The history is provided by the patient and medical records. No language interpreter was used.  Sickle Cell Pain Crisis Back Pain      28 year old male with history of sickle cell disease, chronic pain syndrome, presenting complaining of sickle cell related pain.  Patient report for the past 2 to 3 days he has been experiencing increasing pain to his chest and back similar to prior sickle cell crisis.  Described pain as a pounding throbbing sensation persistent, 8 out of 10 and felt similar to prior sickle cell pain.  Pain is mild improved with home pain medication but pain returns.  Does not complain of any associated fever or productive cough loss of taste or smell.  He attributed his sickle cell pain due to weather changes.  Patient states he has been compliant with his medications.   Past Medical History:  Diagnosis Date  . Depression   . 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 Sickle cell anemia (HCC)   . Vitamin D deficiency y-6    Patient Active Problem List   Diagnosis Date Noted  . Abnormal liver function 11/04/2018  . Sickle cell anemia with crisis ([place]) 05/22/2018  . Acute bilateral low back pain without sciatica   . Other chronic pain   . Anemia of chronic disease   . 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 Physical deconditioning   . Agitation   . Chronic pain syndrome   . Recurrent cold sores   . Fever   . Pain   . Sickle cell crisis ([place]) 07/16/2017  . Adjustment disorder with mixed disturbance of emotions and conduct   . Constipation 08/03/2014  . 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 Protein-calorie malnutrition, severe ([place]) 04/09/2014  . Major depressive disorder,  recurrent, severe without psychotic features ([place])   . 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 Vitamin D deficiency   . Sickle cell anemia ([place]) 03/30/2014  . Hyperbilirubinemia 03/30/2014  . Hypokalemia 02/07/2014  . Aggressive behavior 01/25/2013  . Sickle cell anemia with pain ([place]) 01/24/2013  . Sickle cell pain crisis ([place]) 01/24/2013    Past Surgical History:  Procedure Laterality Date  . CHOLECYSTECTOMY         Family History  Problem Relation Age of Onset  . Sickle cell trait Mother   . Sickle cell trait Father     Social History   Tobacco Use  . Smoking status: Never Smoker  . Smokeless tobacco: Never Used  Substance Use Topics  . Alcohol use: No  . Drug use: Not Currently    Types: Marijuana    Home Medications Prior to Admission medications   Medication Sig Start Date End Date Taking? Authorizing Provider  cholecalciferol (VITAMIN D) 1000 units tablet Take 2 tablets (2,000 Units total) by mouth daily. 12/13/17   Azzie Glatter, FNP  folic acid (FOLVITE) 1 MG tablet Take 1 tablet (1 mg total) by mouth daily. 05/01/17   Dorena Dew, FNP  gabapentin (NEURONTIN) 300 MG capsule TAKE 1 CAPSULE(300 MG) BY MOUTH THREE TIMES DAILY Patient taking differently: Take 300 mg by mouth 3 (three) times daily.  05/08/18  Lanae Boast, FNP  hydroxyurea (HYDREA) 500 MG capsule Take 1 capsule (500 mg total) by mouth 2 (two) times daily. May take with food to minimize GI side effects. 05/01/17   Dorena Dew, FNP  ibuprofen (ADVIL,MOTRIN) 800 MG tablet Take 1 tablet (800 mg total) by mouth 3 (three) times daily as needed (pain). 12/20/17   Azzie Glatter, FNP  Nutritional Supplements (ENSURE ACTIVE HIGH PROTEIN) LIQD Take 1 each by mouth 3 (three) times daily between meals. #90 cans of Ensure 11/13/18   Dorena Dew, FNP  oxyCODONE (OXYCONTIN) 20 mg 12 hr tablet Take 1 tablet (20 mg total) by mouth every 12 (twelve) hours. 12/27/18  01/26/19  Dorena Dew, FNP  oxyCODONE-acetaminophen (PERCOCET) 10-325 MG tablet Take 1 tablet by mouth every 6 (six) hours as needed for up to 15 days for pain. 01/11/19 01/26/19  Tresa Garter, MD    Allergies    Buprenorphine hcl, Levaquin [levofloxacin], Meperidine, Morphine and related, Toradol [ketorolac tromethamine], Tramadol, Vancomycin, and Zosyn [piperacillin sod-tazobactam so]  Review of Systems   Review of Systems  Musculoskeletal: Positive for back pain.  All other systems reviewed and are negative.   Physical Exam Updated Vital Signs BP 113/66 (BP Location: Right Arm)   Pulse 82   Temp 98.3 F (36.8 C) (Oral)   Resp 17   SpO2 100%   Physical Exam Vitals and nursing note reviewed.  Constitutional:      General: He is not in acute distress.    Appearance: He is well-developed.  HENT:     Head: Atraumatic.  Eyes:     Conjunctiva/sclera: Conjunctivae normal.  Cardiovascular:     Rate and Rhythm: Normal rate and regular rhythm.     Pulses: Normal pulses.     Heart sounds: Normal heart sounds.  Pulmonary:     Breath sounds: Normal breath sounds.  Abdominal:     Palpations: Abdomen is soft.     Tenderness: There is no abdominal tenderness.  Musculoskeletal:        General: Tenderness (diffused tenderness to back and chest on ) present.     Cervical back: Neck supple.  Skin:    Findings: No rash.  Neurological:     Mental Status: He is alert. Mental status is at baseline.  Psychiatric:        Mood and Affect: Mood normal.     ED Results / Procedures / Treatments   Labs (all labs ordered are listed, but only abnormal results are displayed) Labs Reviewed  COMPREHENSIVE METABOLIC PANEL - Abnormal; Notable for the following components:      Result Value   Creatinine, Ser 0.53 (*)    Total Bilirubin 2.5 (*)    All other components within normal limits  CBC WITH DIFFERENTIAL/PLATELET - Abnormal; Notable for the following components:   RBC  3.37 (*)    Hemoglobin 10.3 (*)    HCT 29.6 (*)    RDW 18.1 (*)    Platelets 450 (*)    nRBC 1.5 (*)    All other components within normal limits  RETICULOCYTES - Abnormal; Notable for the following components:   Retic Ct Pct 11.1 (*)    RBC. 3.37 (*)    Retic Count, Absolute 373.4 (*)    Immature Retic Fract 39.9 (*)    All other components within normal limits    EKG None  Radiology No results found.  Procedures Procedures (including critical care time)  Medications Ordered in ED  Medications  0.45 % sodium chloride infusion ( Intravenous New Bag/Given 01/26/19 1250)  ketorolac (TORADOL) 30 MG/ML injection 30 mg (30 mg Intravenous Not Given 01/26/19 1251)  HYDROmorphone (DILAUDID) injection 2 mg (has no administration in time range)    Or  HYDROmorphone (DILAUDID) injection 2 mg (has no administration in time range)  diphenhydrAMINE (BENADRYL) capsule 25-50 mg (25 mg Oral Given 01/26/19 1250)  promethazine (PHENERGAN) tablet 25 mg (25 mg Oral Given 01/26/19 1250)  HYDROmorphone (DILAUDID) injection 2 mg (2 mg Intravenous Given 01/26/19 1251)    Or  HYDROmorphone (DILAUDID) injection 2 mg ( Subcutaneous See Alternative 01/26/19 1251)  HYDROmorphone (DILAUDID) injection 2 mg (2 mg Intravenous Given 01/26/19 1336)    Or  HYDROmorphone (DILAUDID) injection 2 mg ( Subcutaneous See Alternative 01/26/19 1336)  HYDROmorphone (DILAUDID) injection 2 mg (2 mg Intravenous Given 01/26/19 1426)    Or  HYDROmorphone (DILAUDID) injection 2 mg ( Subcutaneous See Alternative 01/26/19 1426)    ED Course  I have reviewed the triage vital signs and the nursing notes.  Pertinent labs & imaging results that were available during my care of the patient were reviewed by me and considered in my medical decision making (see chart for details).    MDM Rules/Calculators/A&P     CHA2DS2/VAS Stroke Risk Points      N/A >= 2 Points: High Risk  1 - 1.99 Points: Medium Risk  0 Points: Low Risk      A final score could not be computed because of missing components.: Last  Change: N/A     This score determines the patient's risk of having a stroke if the  patient has atrial fibrillation.      This score is not applicable to this patient. Components are not  calculated.                   BP 113/66 (BP Location: Right Arm)   Pulse 82   Temp 98.3 F (36.8 C) (Oral)   Resp 17   SpO2 100%   Final Clinical Impression(s) / ED Diagnoses Final diagnoses:  Chronic pain syndrome  Hb-SS disease without crisis ([place])    Rx / DC Orders ED Discharge Orders    None     Patient with known history of sickle cell disease here with pain to his chest and back similar to prior sickle cell crisis.  Symptom has been ongoing for the past 2 to 3 days.  He is otherwise well-appearing in no acute respiratory discomfort.  No fever or productive cough concerning for acute chest.  Patient given pain management according to sickle cell protocol.  3:28 PM Patient have received a total of 3 separate doses of pain medication and report no improvement of his symptoms.  Labs are at baseline.  Appreciate consultation from sickle cell day center, Cammie Sickle, NP who will evaluate patient and anticipate discharge home with pain medication and outpatient follow-up.  If patient decides to be admitted, then Cammie Sickle will likely admit for obs. Pt sign out to oncoming provider.   Please refer to [facility] consult note for recommendation.     Domenic Moras, PA-C 01/26/19 1531    Sherwood Gambler, MD 01/27/19 [phone]

## 2019-01-26 NOTE — ED Notes (Signed)
Pt has been eating and drinking and now pt is vomiting. Pt given tissue and emesis bag. RN aware

## 2019-01-28 ENCOUNTER — Non-Acute Institutional Stay (HOSPITAL_COMMUNITY)
Admission: AD | Admit: 2019-01-28 | Discharge: 2019-01-28 | Disposition: A | Discharge status: Home / Self Care | Payer: Medicaid Other | Source: Ambulatory Visit | Attending: Internal Medicine | Admitting: Internal Medicine

## 2019-01-28 ENCOUNTER — Telehealth (HOSPITAL_COMMUNITY): Payer: Self-pay | Admitting: General Practice

## 2019-01-28 ENCOUNTER — Encounter (HOSPITAL_COMMUNITY): Payer: Self-pay | Admitting: Family Medicine

## 2019-01-28 DIAGNOSIS — Z885 Allergy status to narcotic agent status: Secondary | ICD-10-CM | POA: Diagnosis not present

## 2019-01-28 DIAGNOSIS — Z880 Allergy status to penicillin: Secondary | ICD-10-CM | POA: Diagnosis not present

## 2019-01-28 DIAGNOSIS — Z79899 Other long term (current) drug therapy: Secondary | ICD-10-CM | POA: Diagnosis not present

## 2019-01-28 DIAGNOSIS — Z888 Allergy status to other drugs, medicaments and biological substances status: Secondary | ICD-10-CM | POA: Insufficient documentation

## 2019-01-28 DIAGNOSIS — D5700 Hb-SS disease with crisis, unspecified: Secondary | ICD-10-CM | POA: Diagnosis not present

## 2019-01-28 DIAGNOSIS — G894 Chronic pain syndrome: Secondary | ICD-10-CM | POA: Insufficient documentation

## 2019-01-28 DIAGNOSIS — M545 Low back pain: Secondary | ICD-10-CM | POA: Diagnosis present

## 2019-01-28 DIAGNOSIS — Z881 Allergy status to other antibiotic agents status: Secondary | ICD-10-CM | POA: Diagnosis not present

## 2019-01-28 DIAGNOSIS — F1120 Opioid dependence, uncomplicated: Secondary | ICD-10-CM | POA: Diagnosis not present

## 2019-01-28 DIAGNOSIS — E559 Vitamin D deficiency, unspecified: Secondary | ICD-10-CM | POA: Diagnosis not present

## 2019-01-28 LAB — CBC WITH DIFFERENTIAL/PLATELET
Abs Immature Granulocytes: 0.07 10*3/uL (ref 0.00–0.07)
Basophils Absolute: 0 10*3/uL (ref 0.0–0.1)
Basophils Relative: 0 %
Eosinophils Absolute: 0.2 10*3/uL (ref 0.0–0.5)
Eosinophils Relative: 2 %
HCT: 25.4 % — ABNORMAL LOW (ref 39.0–52.0)
Hemoglobin: 9 g/dL — ABNORMAL LOW (ref 13.0–17.0)
Immature Granulocytes: 1 %
Lymphocytes Relative: 14 %
Lymphs Abs: 1.5 10*3/uL (ref 0.7–4.0)
MCH: 31.1 pg (ref 26.0–34.0)
MCHC: 35.4 g/dL (ref 30.0–36.0)
MCV: 87.9 fL (ref 80.0–100.0)
Monocytes Absolute: 1 10*3/uL (ref 0.1–1.0)
Monocytes Relative: 10 %
Neutro Abs: 7.5 10*3/uL (ref 1.7–7.7)
Neutrophils Relative %: 73 %
Platelets: 431 10*3/uL — ABNORMAL HIGH (ref 150–400)
RBC: 2.89 MIL/uL — ABNORMAL LOW (ref 4.22–5.81)
RDW: 18.2 % — ABNORMAL HIGH (ref 11.5–15.5)
WBC: 10.3 10*3/uL (ref 4.0–10.5)
nRBC: 1.5 % — ABNORMAL HIGH (ref 0.0–0.2)

## 2019-01-28 LAB — RETICULOCYTES
Immature Retic Fract: 36.9 % — ABNORMAL HIGH (ref 2.3–15.9)
RBC.: 2.89 MIL/uL — ABNORMAL LOW (ref 4.22–5.81)
Retic Count, Absolute: 333.8 10*3/uL — ABNORMAL HIGH (ref 19.0–186.0)
Retic Ct Pct: 11.6 % — ABNORMAL HIGH (ref 0.4–3.1)

## 2019-01-28 LAB — COMPREHENSIVE METABOLIC PANEL
ALT: 27 U/L (ref 0–44)
AST: 35 U/L (ref 15–41)
Albumin: 4.5 g/dL (ref 3.5–5.0)
Alkaline Phosphatase: 77 U/L (ref 38–126)
Anion gap: 8 (ref 5–15)
BUN: 8 mg/dL (ref 6–20)
CO2: 26 mmol/L (ref 22–32)
Calcium: 9.2 mg/dL (ref 8.9–10.3)
Chloride: 104 mmol/L (ref 98–111)
Creatinine, Ser: 0.61 mg/dL (ref 0.61–1.24)
GFR calc Af Amer: >60 mL/min (ref >60)
GFR calc non Af Amer: >60 mL/min (ref >60)
Glucose, Bld: 100 mg/dL — ABNORMAL HIGH (ref 70–99)
Potassium: 4 mmol/L (ref 3.5–5.1)
Sodium: 138 mmol/L (ref 135–145)
Total Bilirubin: 3.6 mg/dL — ABNORMAL HIGH (ref 0.3–1.2)
Total Protein: 7.9 g/dL (ref 6.5–8.1)

## 2019-01-28 MED ORDER — DIPHENHYDRAMINE HCL 25 MG PO CAPS: 25.0000 mg | ORAL_CAPSULE | ORAL | Status: DC | PRN

## 2019-01-28 MED ORDER — HYDROMORPHONE 1 MG/ML IV SOLN
INTRAVENOUS | Status: DC
  Administered 2019-01-28: 30 mg via INTRAVENOUS
  Administered 2019-01-28: 8.5 mg via INTRAVENOUS
  Filled 2019-01-28: qty 30

## 2019-01-28 MED ORDER — ONDANSETRON HCL 4 MG/2ML IJ SOLN: 4.0000 mg | Freq: Four times a day (QID) | INTRAMUSCULAR | Status: DC | PRN

## 2019-01-28 MED ORDER — SODIUM CHLORIDE 0.9% FLUSH: 9.0000 mL | INTRAVENOUS | Status: DC | PRN

## 2019-01-28 MED ORDER — NALOXONE HCL 0.4 MG/ML IJ SOLN: 0.4000 mg | INTRAMUSCULAR | Status: DC | PRN

## 2019-01-28 MED ORDER — DEXTROSE-NACL 5-0.45 % IV SOLN
INTRAVENOUS | Status: DC
  Administered 2019-01-28: 10:00:00 via INTRAVENOUS

## 2019-01-28 NOTE — Discharge Summary (Signed)
[facility] Discharge Summary   Patient ID: Timothy Marks MRN: [id number] DOB/AGE: 15-Mar-1990 28 y.o.  Admit date: 01/28/2019 Discharge date: 01/28/2019  Primary Care Physician:  Tresa Garter, MD  Admission Diagnoses:  Active Problems:   Sickle cell pain crisis [facility])   Discharge Medications:  Allergies as of 01/28/2019      Reactions   Buprenorphine Hcl Hives   Levaquin [levofloxacin] Itching   Meperidine Rash   Morphine Hives   Morphine And Related Hives   Toradol [ketorolac Tromethamine] Itching   Tramadol Hives   Vancomycin Itching   Zosyn [piperacillin Sod-tazobactam So] Itching, Rash   Has taken rocephin in past      Medication List    TAKE these medications   cholecalciferol 25 MCG (1000 UT) tablet Commonly known as: VITAMIN D Take 2 tablets (2,000 Units total) by mouth daily.   Ensure Active High Protein Liqd Take 1 each by mouth 3 (three) times daily between meals. #90 cans of Ensure   folic acid 1 MG tablet Commonly known as: FOLVITE Take 1 tablet (1 mg total) by mouth daily.   gabapentin 300 MG capsule Commonly known as: NEURONTIN TAKE 1 CAPSULE(300 MG) BY MOUTH THREE TIMES DAILY What changed: See the new instructions.   HYDROcodone-acetaminophen 5-325 MG tablet Commonly known as: NORCO/VICODIN Take 1 tablet by mouth every 6 (six) hours as needed for moderate pain or severe pain.   hydroxyurea 500 MG capsule Commonly known as: HYDREA Take 1 capsule (500 mg total) by mouth 2 (two) times daily. May take with food to minimize GI side effects.   ibuprofen 800 MG tablet Commonly known as: ADVIL Take 1 tablet (800 mg total) by mouth 3 (three) times daily as needed (pain). What changed: reasons to take this   oxyCODONE 20 mg 12 hr tablet Commonly known as: OXYCONTIN Take 1 tablet (20 mg total) by mouth every 12 (twelve) hours.   oxyCODONE-acetaminophen 10-325 MG tablet Commonly known as: PERCOCET Take 1 tablet by mouth  every 4 (four) hours as needed for up to 15 days for pain.        Consults:  None  Significant Diagnostic Studies:  No results found.  History of present illness:  Timothy Marks, a 28 year old male with a medical history of sickle cell disease type SS, chronic pain syndrome, opiate dependence and tolerance, oppositional defiance disorder presents complaining of pain primarily to low back that is consistent with his typical pain crisis.  Patient was treated and evaluated in the emergency department on 01/26/2019 for this problem without complete resolution.  Pain persists despite taking oxycodone and OxyContin this a.m.  Pain intensity is 9/10 characterized as constant and aching.  Patient attributes pain crisis to increased stressors, changes in weather, and he was out of pain medications on last week.  Patient denies headache, chest pain, shortness of breath, persistent cough, sore throat, urinary symptoms, no nausea, vomiting, or diarrhea.  No fever, chills, recent travel, sick contacts, or exposure to COVID-19.  [facility] Course: Patient admitted to sickle cell day infusion center for management of sickle cell pain crisis.  Reviewed all laboratory values.  Hemoglobin 9.0, consistent with patient's baseline.  There is no clinical indication for blood transfusion at this time.  All other labs reassuring. Pain managed with IV Dilaudid via PCA with settings of 0.5 mg, 10-minute lockout, and 3 mg/h. Toradol 15 mg IV x1 Tylenol 1000 mg by mouth x1 IV fluids, 0.45% saline at  50 mL/h  Pain intensity decreased to 5/10. Patient does not warrant admission at this time. He can manage at home on current medication regimen.  Zerik is alert, oriented, and ambulating without assistance. He will discharge home in a hemodynamically stable condition.  Patient was given clear instructions regarding medication management appointments going further.  Discharge instructions: Resume all  home medications.  Follow up with PCP as previously  scheduled.   Discussed the importance of drinking 64 ounces of water daily to  help prevent pain crises, it is important to drink plenty of water throughout the day. This is because dehydration of red blood cells may lead further sickling.   Avoid all stressors that precipitate sickle cell pain crisis.     The patient was given clear instructions to go to ER or return to medical center if symptoms do not improve, worsen or new problems develop.    Physical Exam at Discharge:  BP (!) 121/55 (BP Location: Right Arm)   Pulse 93   Temp 97.8 F (36.6 C) (Oral)   Resp 12   SpO2 94%   Physical Exam Constitutional:      Appearance: Normal appearance.  HENT:     Head: Normocephalic.     Nose: Nose normal.     Mouth/Throat:     Mouth: Mucous membranes are moist.  Eyes:     Pupils: Pupils are equal, round, and reactive to light.  Cardiovascular:     Rate and Rhythm: Normal rate and regular rhythm.     Pulses: Normal pulses.     Heart sounds: Normal heart sounds.  Pulmonary:     Effort: Pulmonary effort is normal.     Breath sounds: Normal breath sounds.  Abdominal:     General: Bowel sounds are normal.     Palpations: Abdomen is soft.  Musculoskeletal:        General: Normal range of motion.  Skin:    General: Skin is warm.  Neurological:     General: No focal deficit present.     Mental Status: He is alert and oriented to person, place, and time. Mental status is at baseline.  Psychiatric:        Mood and Affect: Mood normal.        Behavior: Behavior normal.        Thought Content: Thought content normal.        Judgment: Judgment normal.       Disposition at Discharge: Discharge disposition: 01-Home or Self Care       Discharge Orders: Discharge Instructions    Discharge patient   Complete by: As directed    Discharge disposition: 01-Home or Self Care   Discharge patient date: 01/28/2019       Condition at Discharge:   Stable  Time spent on Discharge:  Greater than 30 minutes.  Signed: Cammie Sickle 01/28/2019, 4:05 PM

## 2019-01-28 NOTE — Discharge Instructions (Signed)
Place prescription refill when you run out of medications. You will sigh a new pain agreement at your follow up appointment.   Sickle Cell Anemia, Adult  Sickle cell anemia is a condition where your red blood cells are shaped like sickles. Red blood cells carry oxygen through the body. Sickle-shaped cells do not live as long as normal red blood cells. They also clump together and block blood from flowing through the blood vessels. This prevents the body from getting enough oxygen. Sickle cell anemia causes organ damage and pain. It also increases the risk of infection. Follow these instructions at home: Medicines  Take over-the-counter and prescription medicines only as told by your doctor.  If you were prescribed an antibiotic medicine, take it as told by your doctor. Do not stop taking the antibiotic even if you start to feel better.  If you develop a fever, do not take medicines to lower the fever right away. Tell your doctor about the fever. Managing pain, stiffness, and swelling  Try these methods to help with pain: ? Use a heating pad. ? Take a warm bath. ? Distract yourself, such as by watching TV. Eating and drinking  Drink enough fluid to keep your pee (urine) clear or pale yellow. Drink more in hot weather and during exercise.  Limit or avoid alcohol.  Eat a healthy diet. Eat plenty of fruits, vegetables, whole grains, and lean protein.  Take vitamins and supplements as told by your doctor. Traveling  When traveling, keep these with you: ? Your medical information. ? The names of your doctors. ? Your medicines.  If you need to take an airplane, talk to your doctor first. Activity  Rest often.  Avoid exercises that make your heart beat much faster, such as jogging. General instructions  Do not use products that have nicotine or tobacco, such as cigarettes and e-cigarettes. If you need help quitting, ask your doctor.  Consider wearing a medical alert  bracelet.  Avoid being in high places (high altitudes), such as mountains.  Avoid very hot or cold temperatures.  Avoid places where the temperature changes a lot.  Keep all follow-up visits as told by your doctor. This is important. Contact a doctor if:  A joint hurts.  Your feet or hands hurt or swell.  You feel tired (fatigued). Get help right away if:  You have symptoms of infection. These include: ? Fever. ? Chills. ? Being very tired. ? Irritability. ? Poor eating. ? Throwing up (vomiting).  You feel dizzy or faint.  You have new stomach pain, especially on the left side.  You have a an erection (priapism) that lasts more than 4 hours.  You have numbness in your arms or legs.  You have a hard time moving your arms or legs.  You have trouble talking.  You have pain that does not go away when you take medicine.  You are short of breath.  You are breathing fast.  You have a long-term cough.  You have pain in your chest.  You have a bad headache.  You have a stiff neck.  Your stomach looks bloated even though you did not eat much.  Your skin is pale.  You suddenly cannot see well. Summary  Sickle cell anemia is a condition where your red blood cells are shaped like sickles.  Follow your doctor's advice on ways to manage pain, food to eat, activities to do, and steps to take for safe travel.  Get medical help right away  if you have any signs of infection, such as a fever. This information is not intended to replace advice given to you by your health care provider. Make sure you discuss any questions you have with your health care provider. Document Released: 11/21/2012 Document Revised: 05/25/2018 Document Reviewed: 03/08/2016 Elsevier Patient Education  2020 [employer].

## 2019-01-28 NOTE — Progress Notes (Signed)
Patient admitted to the day hospital for treatment of sickle cell pain crisis. Patient reported pain rated 8/10 in the back and mid chest. Patient placed on Dilaudid PCA and hydrated with IV fluids. At discharge patient reported  pain at 5/10. Discharge instructions given to patient. Patient alert, oriented and ambulatory at discharge.

## 2019-01-28 NOTE — Telephone Encounter (Signed)
Patient called, complained of pain in the Back, middle chest "typical" rated at 8/10. Denied fever, diarrhea, abdominal pain, nausea/vomitting. Screened negative for Covid-19 symptoms. Admitted to having means of transportation without driving self after treatment. Last took 20 mg of Oxycontin and 10 mg of Percocet at about 7:40 am today. Per provider, patient can come to the day hospital for treatment. Patient notified, verbalized understanding.

## 2019-01-28 NOTE — H&P (Signed)
[facility] History and Physical   Date: 01/28/2019  Patient name: Timothy Marks Medical record number: [id number] Date of birth: 1990/05/17 Age: 28 y.o. Gender: male PCP: Tresa Garter, MD  Attending physician: Tresa Garter, MD  Chief Complaint: Sickle cell pain  History of Present Illness: Timothy Marks, a 28 year old male with a medical history of sickle cell disease type SS, chronic pain syndrome, opiate dependence and tolerance, oppositional defiance disorder presents complaining of pain primarily to low back that is consistent with his typical pain crisis.  Patient was treated and evaluated in the emergency department on 01/26/2019 for this problem without complete resolution.  Pain persists despite taking oxycodone and OxyContin this a.m.  Pain intensity is 9/10 characterized as constant and aching.  Patient attributes pain crisis to increased stressors, changes in weather, and he was out of pain medications on last week.  Patient denies headache, chest pain, shortness of breath, persistent cough, sore throat, urinary symptoms, no nausea, vomiting, or diarrhea.  No fever, chills, recent travel, sick contacts, or exposure to COVID-19. Meds: Medications Prior to Admission  Medication Sig Dispense Refill Last Dose  . cholecalciferol (VITAMIN D) 1000 units tablet Take 2 tablets (2,000 Units total) by mouth daily. 30 tablet 6   . folic acid (FOLVITE) 1 MG tablet Take 1 tablet (1 mg total) by mouth daily. 90 tablet 2   . gabapentin (NEURONTIN) 300 MG capsule TAKE 1 CAPSULE(300 MG) BY MOUTH THREE TIMES DAILY (Patient taking differently: Take 300 mg by mouth 3 (three) times daily. ) 90 capsule 0   . HYDROcodone-acetaminophen (NORCO/VICODIN) 5-325 MG tablet Take 1 tablet by mouth every 6 (six) hours as needed for moderate pain or severe pain.     . hydroxyurea (HYDREA) 500 MG capsule Take 1 capsule (500 mg total) by mouth 2 (two) times daily. May take with food to  minimize GI side effects. 180 capsule 2   . ibuprofen (ADVIL,MOTRIN) 800 MG tablet Take 1 tablet (800 mg total) by mouth 3 (three) times daily as needed (pain). (Patient taking differently: Take 800 mg by mouth 3 (three) times daily as needed for mild pain or moderate pain. ) 30 tablet 0   . Nutritional Supplements (ENSURE ACTIVE HIGH PROTEIN) LIQD Take 1 each by mouth 3 (three) times daily between meals. #90 cans of Ensure 237 mL 11   . oxyCODONE (OXYCONTIN) 20 mg 12 hr tablet Take 1 tablet (20 mg total) by mouth every 12 (twelve) hours. 6 tablet 0   . oxyCODONE-acetaminophen (PERCOCET) 10-325 MG tablet Take 1 tablet by mouth every 4 (four) hours as needed for up to 15 days for pain. 18 tablet 0     Allergies: Buprenorphine hcl, Levaquin [levofloxacin], Meperidine, Morphine, Morphine and related, Toradol [ketorolac tromethamine], Tramadol, Vancomycin, and Zosyn [piperacillin sod-tazobactam so] Past Medical History:  Diagnosis Date  . Depression   . 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 Sickle cell anemia (HCC)   . Vitamin D deficiency y-6   Past Surgical History:  Procedure Laterality Date  . CHOLECYSTECTOMY     Family History  Problem Relation Age of Onset  . Sickle cell trait Mother   . Sickle cell trait Father    Social History   Socioeconomic History  . Marital status: Single    Spouse name: Not on file  . Number of children: Not on file  . Years of education: Not on file  .  Highest education level: Not on file  Occupational History  . Not on file  Tobacco Use  . Smoking status: Never Smoker  . Smokeless tobacco: Never Used  Substance and Sexual Activity  . Alcohol use: No  . Drug use: Not Currently    Types: Marijuana  . Sexual activity: Not on file  Other Topics Concern  . Not on file  Social History Narrative  . Not on file   Social Determinants of Health   Financial Resource Strain:   .  Difficulty of Paying Living Expenses: Not on file  Food Insecurity:   . Worried About Charity fundraiser in the Last Year: Not on file  . Ran Out of Food in the Last Year: Not on file  Transportation Needs:   . Lack of Transportation (Medical): Not on file  . Lack of Transportation (Non-Medical): Not on file  Physical Activity:   . Days of Exercise per Week: Not on file  . Minutes of Exercise per Session: Not on file  Stress:   . Feeling of Stress : Not on file  Social Connections:   . Frequency of Communication with Friends and Family: Not on file  . Frequency of Social Gatherings with Friends and Family: Not on file  . Attends Religious Services: Not on file  . Active Member of Clubs or Organizations: Not on file  . Attends Archivist Meetings: Not on file  . Marital Status: Not on file  Intimate Partner Violence:   . Fear of Current or Ex-Partner: Not on file  . Emotionally Abused: Not on file  . Physically Abused: Not on file  . Sexually Abused: Not on file   Review of Systems  Constitutional: Negative for chills and fever.  HENT: Negative.   Respiratory: Negative.   Cardiovascular: Negative.   Gastrointestinal: Negative.   Genitourinary: Negative.   Musculoskeletal: Positive for back pain and joint pain.  Skin: Negative.   Neurological: Negative.   Endo/Heme/Allergies: Negative.   Psychiatric/Behavioral: Negative.     Physical Exam: There were no vitals taken for this visit. Physical Exam Constitutional:      Appearance: Normal appearance.  HENT:     Head: Normocephalic.     Mouth/Throat:     Mouth: Mucous membranes are moist.     Pharynx: Oropharynx is clear.  Cardiovascular:     Rate and Rhythm: Normal rate and regular rhythm.     Pulses: Normal pulses.     Heart sounds: Normal heart sounds.  Pulmonary:     Effort: Pulmonary effort is normal.     Breath sounds: Normal breath sounds.  Abdominal:     General: Abdomen is flat. Bowel sounds are  normal.  Musculoskeletal:        General: Normal range of motion.  Skin:    General: Skin is warm.  Neurological:     General: No focal deficit present.     Mental Status: He is alert. Mental status is at baseline.  Psychiatric:        Mood and Affect: Mood normal.        Behavior: Behavior normal.        Thought Content: Thought content normal.        Judgment: Judgment normal.      Lab results: No results found for this or any previous visit (from the past 24 hour(s)).  Imaging results:  No results found.   Assessment & Plan: Patient admitted to sickle cell day infusion center  for management of sickle cell pain crisis. He is opiate tolerant.  Initiate IV dilaudid PCA per weight based protocol. Settings of 0.5 mg, 10 minute lockout, and 3 mg per hour.  IV fluids, D5.45% saline at 50 ml/hr Toradol 15 mg IV times one Tylenol 1000 mg times 1 dose Review CBC w/differential, CMP, and reticulocytes as results becomes available.  Pain intensity will be reevaluated in context of functioning and relationship to baseline as care progresses.  If pain intensity remains elevated and/or changes in hemodynamic stability, transition to inpatient service for higher level of care.    [name]  APRN, MSN, FNP-C [facility] [address] [place], [place] [address] [phone]  01/28/2019, 9:43 AM

## 2019-01-30 ENCOUNTER — Telehealth: Payer: Self-pay | Admitting: Family Medicine

## 2019-01-30 ENCOUNTER — Other Ambulatory Visit: Payer: Self-pay | Admitting: Family Medicine

## 2019-01-30 DIAGNOSIS — D571 Sickle-cell disease without crisis: Secondary | ICD-10-CM

## 2019-01-30 MED ORDER — OXYCODONE-ACETAMINOPHEN 10-325 MG PO TABS: 1.0000 | ORAL_TABLET | Freq: Four times a day (QID) | ORAL | 0 refills | Status: DC | PRN

## 2019-01-30 MED ORDER — OXYCODONE HCL ER 20 MG PO T12A: 20.0000 mg | EXTENDED_RELEASE_TABLET | Freq: Two times a day (BID) | ORAL | 0 refills | Status: DC

## 2019-01-30 NOTE — Progress Notes (Signed)
Timothy Marks, a 28 year old male with a medical history significant for sickle cell disease, chronic pain syndrome, opiate dependence and tolerance, and history of oppositional defiance disorder is requesting opiate medications for pain control.   Patients previous urine drug screens reviewed, inconsistencies noted. Patient has had 2 consecutive UDS without opiates.  Discussed this finding with patient at length. I have agreed to only prescribe weekly medications for this patient. Also, frequent random UDS.  Reviewed pain prescription agreement with patient at length, he expressed understanding. He will need a lab appointment and pill count on 02/04/2019, which will be a nurse visit.    Meds ordered this encounter  Medications  . oxyCODONE (OXYCONTIN) 20 mg 12 hr tablet    Sig: Take 1 tablet (20 mg total) by mouth every 12 (twelve) hours for 7 days.    Dispense:  14 tablet    Refill:  0    Not to be received prior to 12/27/2018    Order Specific Question:   Supervising Provider    Answer:   Tresa Garter [id number]  . oxyCODONE-acetaminophen (PERCOCET) 10-325 MG tablet    Sig: Take 1 tablet by mouth every 6 (six) hours as needed for up to 7 days for pain.    Dispense:  30 tablet    Refill:  0    Order Specific Question:   Supervising Provider    Answer:   Tresa Garter [id number]    [name]  APRN, MSN, FNP-C [facility] [address] [place], [place] [address] [phone]

## 2019-02-01 NOTE — Telephone Encounter (Signed)
done

## 2019-02-04 ENCOUNTER — Other Ambulatory Visit: Payer: Self-pay

## 2019-02-04 ENCOUNTER — Other Ambulatory Visit: Payer: Self-pay | Admitting: Family Medicine

## 2019-02-04 ENCOUNTER — Ambulatory Visit: Payer: Medicaid Other | Admitting: Family Medicine

## 2019-02-04 DIAGNOSIS — D571 Sickle-cell disease without crisis: Secondary | ICD-10-CM

## 2019-02-04 DIAGNOSIS — G894 Chronic pain syndrome: Secondary | ICD-10-CM

## 2019-02-04 DIAGNOSIS — Z09 Encounter for follow-up examination after completed treatment for conditions other than malignant neoplasm: Secondary | ICD-10-CM

## 2019-02-04 DIAGNOSIS — D5700 Hb-SS disease with crisis, unspecified: Secondary | ICD-10-CM

## 2019-02-04 DIAGNOSIS — Z79899 Other long term (current) drug therapy: Secondary | ICD-10-CM

## 2019-02-04 NOTE — Progress Notes (Signed)
Patient warrants urine drug screen and opiate count with documentation. He will be a nurse visit.  Orders Placed This Encounter  Procedures  . [id number] 11+Oxyco+Alc+Crt-Bund    Standing Status:   Future    Standing Expiration Date:   02/04/2020   [name]  APRN, MSN, FNP-C [facility] [address] [place], [place] [address] [phone]

## 2019-02-04 NOTE — Progress Notes (Signed)
Patient medication count:  4 OXY  10/325mg  7 OXY 20ER

## 2019-02-04 NOTE — Addendum Note (Signed)
Addended by: Franchot Gallo on: 02/04/2019 10:29 AM   Modules accepted: Orders

## 2019-02-05 ENCOUNTER — Telehealth: Payer: Self-pay | Admitting: Family Medicine

## 2019-02-05 ENCOUNTER — Other Ambulatory Visit: Payer: Self-pay | Admitting: Family Medicine

## 2019-02-05 DIAGNOSIS — D571 Sickle-cell disease without crisis: Secondary | ICD-10-CM

## 2019-02-05 MED ORDER — OXYCODONE HCL ER 20 MG PO T12A: 20.0000 mg | EXTENDED_RELEASE_TABLET | Freq: Two times a day (BID) | ORAL | 0 refills | Status: DC

## 2019-02-05 MED ORDER — OXYCODONE-ACETAMINOPHEN 10-325 MG PO TABS: 1.0000 | ORAL_TABLET | Freq: Four times a day (QID) | ORAL | 0 refills | Status: DC | PRN

## 2019-02-05 NOTE — Progress Notes (Signed)
Reviewed PDMP substance reporting system prior to prescribing opiate medications. No inconsistencies noted.    Meds ordered this encounter  Medications  . oxyCODONE (OXYCONTIN) 20 mg 12 hr tablet    Sig: Take 1 tablet (20 mg total) by mouth every 12 (twelve) hours for 7 days.    Dispense:  14 tablet    Refill:  0    Not to be received prior to 12/27/2018    Order Specific Question:   Supervising Provider    Answer:   Tresa Garter [id number]  . oxyCODONE-acetaminophen (PERCOCET) 10-325 MG tablet    Sig: Take 1 tablet by mouth every 6 (six) hours as needed for up to 7 days for pain.    Dispense:  30 tablet    Refill:  0    Order Specific Question:   Supervising Provider    Answer:   Tresa Garter [id number]    [name]  APRN, MSN, FNP-C [facility] [address] [place], [place] [address] [phone]

## 2019-02-06 NOTE — Telephone Encounter (Signed)
done

## 2019-02-08 ENCOUNTER — Other Ambulatory Visit: Payer: Self-pay

## 2019-02-08 ENCOUNTER — Inpatient Hospital Stay (HOSPITAL_COMMUNITY)
Admission: EM | Admit: 2019-02-08 | Discharge: 2019-02-15 | DRG: 812 | Disposition: A | Discharge status: Home / Self Care | Payer: Medicaid Other | Attending: Internal Medicine | Admitting: Internal Medicine

## 2019-02-08 ENCOUNTER — Encounter (HOSPITAL_COMMUNITY): Payer: Self-pay

## 2019-02-08 DIAGNOSIS — E876 Hypokalemia: Secondary | ICD-10-CM | POA: Diagnosis present

## 2019-02-08 DIAGNOSIS — R6510 Systemic inflammatory response syndrome (SIRS) of non-infectious origin without acute organ dysfunction: Secondary | ICD-10-CM

## 2019-02-08 DIAGNOSIS — M90512 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, left shoulder: Secondary | ICD-10-CM | POA: Diagnosis present

## 2019-02-08 DIAGNOSIS — D571 Sickle-cell disease without crisis: Secondary | ICD-10-CM

## 2019-02-08 DIAGNOSIS — F913 Oppositional defiant disorder: Secondary | ICD-10-CM | POA: Diagnosis present

## 2019-02-08 DIAGNOSIS — F1120 Opioid dependence, uncomplicated: Secondary | ICD-10-CM | POA: Diagnosis present

## 2019-02-08 DIAGNOSIS — D5700 Hb-SS disease with crisis, unspecified: Secondary | ICD-10-CM | POA: Diagnosis present

## 2019-02-08 DIAGNOSIS — Z20822 Contact with and (suspected) exposure to covid-19: Secondary | ICD-10-CM | POA: Diagnosis present

## 2019-02-08 DIAGNOSIS — Z888 Allergy status to other drugs, medicaments and biological substances status: Secondary | ICD-10-CM

## 2019-02-08 DIAGNOSIS — G894 Chronic pain syndrome: Secondary | ICD-10-CM | POA: Diagnosis present

## 2019-02-08 DIAGNOSIS — Z832 Family history of diseases of the blood and blood-forming organs and certain disorders involving the immune mechanism: Secondary | ICD-10-CM | POA: Diagnosis not present

## 2019-02-08 DIAGNOSIS — Z881 Allergy status to other antibiotic agents status: Secondary | ICD-10-CM

## 2019-02-08 DIAGNOSIS — Z885 Allergy status to narcotic agent status: Secondary | ICD-10-CM | POA: Diagnosis not present

## 2019-02-08 DIAGNOSIS — F4325 Adjustment disorder with mixed disturbance of emotions and conduct: Secondary | ICD-10-CM | POA: Diagnosis present

## 2019-02-08 DIAGNOSIS — R509 Fever, unspecified: Secondary | ICD-10-CM

## 2019-02-08 LAB — COMPREHENSIVE METABOLIC PANEL
ALT: 16 U/L (ref 0–44)
AST: 35 U/L (ref 15–41)
Albumin: 4.7 g/dL (ref 3.5–5.0)
Alkaline Phosphatase: 67 U/L (ref 38–126)
Anion gap: 9 (ref 5–15)
BUN: 6 mg/dL (ref 6–20)
CO2: 23 mmol/L (ref 22–32)
Calcium: 9.5 mg/dL (ref 8.9–10.3)
Chloride: 105 mmol/L (ref 98–111)
Creatinine, Ser: 0.63 mg/dL (ref 0.61–1.24)
GFR calc Af Amer: >60 mL/min (ref >60)
GFR calc non Af Amer: >60 mL/min (ref >60)
Glucose, Bld: 116 mg/dL — ABNORMAL HIGH (ref 70–99)
Potassium: 3.4 mmol/L — ABNORMAL LOW (ref 3.5–5.1)
Sodium: 137 mmol/L (ref 135–145)
Total Bilirubin: 1.9 mg/dL — ABNORMAL HIGH (ref 0.3–1.2)
Total Protein: 8.3 g/dL — ABNORMAL HIGH (ref 6.5–8.1)

## 2019-02-08 LAB — CBC WITH DIFFERENTIAL/PLATELET
Abs Immature Granulocytes: 0.16 10*3/uL — ABNORMAL HIGH (ref 0.00–0.07)
Basophils Absolute: 0.1 10*3/uL (ref 0.0–0.1)
Basophils Relative: 0 %
Eosinophils Absolute: 0.3 10*3/uL (ref 0.0–0.5)
Eosinophils Relative: 1 %
HCT: 28.9 % — ABNORMAL LOW (ref 39.0–52.0)
Hemoglobin: 9.9 g/dL — ABNORMAL LOW (ref 13.0–17.0)
Immature Granulocytes: 1 %
Lymphocytes Relative: 19 %
Lymphs Abs: 3.7 10*3/uL (ref 0.7–4.0)
MCH: 30.5 pg (ref 26.0–34.0)
MCHC: 34.3 g/dL (ref 30.0–36.0)
MCV: 88.9 fL (ref 80.0–100.0)
Monocytes Absolute: 1.3 10*3/uL — ABNORMAL HIGH (ref 0.1–1.0)
Monocytes Relative: 6 %
Neutro Abs: 14.2 10*3/uL — ABNORMAL HIGH (ref 1.7–7.7)
Neutrophils Relative %: 73 %
Platelets: 499 10*3/uL — ABNORMAL HIGH (ref 150–400)
RBC: 3.25 MIL/uL — ABNORMAL LOW (ref 4.22–5.81)
RDW: 18.6 % — ABNORMAL HIGH (ref 11.5–15.5)
WBC: 19.6 10*3/uL — ABNORMAL HIGH (ref 4.0–10.5)
nRBC: 1.1 % — ABNORMAL HIGH (ref 0.0–0.2)

## 2019-02-08 LAB — RETICULOCYTES
Immature Retic Fract: 41.1 % — ABNORMAL HIGH (ref 2.3–15.9)
RBC.: 3.26 MIL/uL — ABNORMAL LOW (ref 4.22–5.81)
Retic Count, Absolute: 339.7 10*3/uL — ABNORMAL HIGH (ref 19.0–186.0)
Retic Ct Pct: 10.4 % — ABNORMAL HIGH (ref 0.4–3.1)

## 2019-02-08 MED ORDER — SODIUM CHLORIDE 0.9% FLUSH: 9.0000 mL | INTRAVENOUS | Status: DC | PRN

## 2019-02-08 MED ORDER — SODIUM CHLORIDE 0.9 % IV SOLN
25.0000 mg | INTRAVENOUS | Status: DC | PRN
  Filled 2019-02-08: qty 0.5

## 2019-02-08 MED ORDER — HYDROMORPHONE HCL 1 MG/ML IJ SOLN
0.5000 mg | Freq: Once | INTRAMUSCULAR | Status: AC
  Administered 2019-02-08: 0.5 mg via INTRAVENOUS
  Filled 2019-02-08: qty 0.5

## 2019-02-08 MED ORDER — POLYETHYLENE GLYCOL 3350 17 G PO PACK: 17.0000 g | PACK | Freq: Every day | ORAL | Status: DC | PRN

## 2019-02-08 MED ORDER — HYDROMORPHONE HCL 2 MG/ML IJ SOLN: 2.0000 mg | INTRAMUSCULAR | Status: AC

## 2019-02-08 MED ORDER — POTASSIUM CHLORIDE 10 MEQ/100ML IV SOLN
10.0000 meq | INTRAVENOUS | Status: AC
  Administered 2019-02-09 (×2): 10 meq via INTRAVENOUS
  Filled 2019-02-08 (×2): qty 100

## 2019-02-08 MED ORDER — SODIUM CHLORIDE 0.45 % IV SOLN
INTRAVENOUS | Status: DC
  Administered 2019-02-08 – 2019-02-09 (×2): 150 mL/h via INTRAVENOUS

## 2019-02-08 MED ORDER — HYDROMORPHONE HCL 2 MG/ML IJ SOLN
2.0000 mg | INTRAMUSCULAR | Status: AC
  Filled 2019-02-08: qty 1

## 2019-02-08 MED ORDER — ONDANSETRON HCL 4 MG/2ML IJ SOLN: 4.0000 mg | Freq: Four times a day (QID) | INTRAMUSCULAR | Status: DC | PRN

## 2019-02-08 MED ORDER — NALOXONE HCL 0.4 MG/ML IJ SOLN: 0.4000 mg | INTRAMUSCULAR | Status: DC | PRN

## 2019-02-08 MED ORDER — DIPHENHYDRAMINE HCL 25 MG PO CAPS
25.0000 mg | ORAL_CAPSULE | ORAL | Status: DC | PRN
  Administered 2019-02-08 – 2019-02-14 (×3): 25 mg via ORAL
  Filled 2019-02-08 (×3): qty 1

## 2019-02-08 MED ORDER — ENOXAPARIN SODIUM 40 MG/0.4ML ~~LOC~~ SOLN
40.0000 mg | SUBCUTANEOUS | Status: DC
  Filled 2019-02-08 (×3): qty 0.4

## 2019-02-08 MED ORDER — HYDROMORPHONE 1 MG/ML IV SOLN
INTRAVENOUS | Status: DC
  Administered 2019-02-08: 30 mg via INTRAVENOUS
  Administered 2019-02-09: 1 mg via INTRAVENOUS
  Administered 2019-02-09: 6.5 mg via INTRAVENOUS
  Administered 2019-02-09: 4.5 mg via INTRAVENOUS
  Administered 2019-02-09: 1 mg via INTRAVENOUS
  Administered 2019-02-09: 4 mg via INTRAVENOUS
  Administered 2019-02-09: 1 mg via INTRAVENOUS
  Administered 2019-02-09: 30 mg via INTRAVENOUS
  Administered 2019-02-09: 1 mg via INTRAVENOUS
  Administered 2019-02-09: 1.5 mg via INTRAVENOUS
  Administered 2019-02-09 (×3): 4.5 mg via INTRAVENOUS
  Administered 2019-02-10: 30 mg via INTRAVENOUS
  Administered 2019-02-10 (×3): 1 mg via INTRAVENOUS
  Administered 2019-02-10: 1.5 mg via INTRAVENOUS
  Administered 2019-02-10 (×2): 1 mg via INTRAVENOUS
  Administered 2019-02-10: 0 mg via INTRAVENOUS
  Administered 2019-02-10: 6.5 mg via INTRAVENOUS
  Administered 2019-02-10: 4.5 mg via INTRAVENOUS
  Administered 2019-02-10: 1 mg via INTRAVENOUS
  Administered 2019-02-10: 2.5 mg via INTRAVENOUS
  Administered 2019-02-11: 5.5 mg via INTRAVENOUS
  Administered 2019-02-11: 5 mg via INTRAVENOUS
  Administered 2019-02-11: 30 mg via INTRAVENOUS
  Administered 2019-02-11: 5 mg via INTRAVENOUS
  Administered 2019-02-11: 9.5 mg via INTRAVENOUS
  Administered 2019-02-11: 1 mg via INTRAVENOUS
  Administered 2019-02-12: 6.5 mg via INTRAVENOUS
  Administered 2019-02-12: 30 mg via INTRAVENOUS
  Administered 2019-02-12 (×3): 1 mg via INTRAVENOUS
  Administered 2019-02-12: 2 mg via INTRAVENOUS
  Administered 2019-02-12: 1 mg via INTRAVENOUS
  Administered 2019-02-12: 16 mg via INTRAVENOUS
  Administered 2019-02-12: 1.5 mg via INTRAVENOUS
  Administered 2019-02-13: 1 mg via INTRAVENOUS
  Administered 2019-02-13: 30 mg via INTRAVENOUS
  Administered 2019-02-13: 1 mg via INTRAVENOUS
  Administered 2019-02-13: 9.5 mg via INTRAVENOUS
  Administered 2019-02-13 (×2): 1 mg via INTRAVENOUS
  Administered 2019-02-13: 1.5 mg via INTRAVENOUS
  Administered 2019-02-13: 1 mg via INTRAVENOUS
  Administered 2019-02-13: 12.6 mg via INTRAVENOUS
  Filled 2019-02-08 (×6): qty 30

## 2019-02-08 MED ORDER — HYDROMORPHONE HCL 2 MG/ML IJ SOLN
2.0000 mg | INTRAMUSCULAR | Status: AC
  Administered 2019-02-08: 2 mg via INTRAVENOUS
  Filled 2019-02-08: qty 1

## 2019-02-08 MED ORDER — SENNOSIDES-DOCUSATE SODIUM 8.6-50 MG PO TABS
1.0000 | ORAL_TABLET | Freq: Two times a day (BID) | ORAL | Status: DC
  Administered 2019-02-08 – 2019-02-15 (×14): 1 via ORAL
  Filled 2019-02-08 (×14): qty 1

## 2019-02-08 MED ORDER — HYDROMORPHONE HCL 2 MG/ML IJ SOLN
2.0000 mg | INTRAMUSCULAR | Status: AC
  Administered 2019-02-08: 2 mg via INTRAVENOUS

## 2019-02-08 MED ORDER — HYDROMORPHONE HCL 2 MG/ML IJ SOLN
2.0000 mg | Freq: Once | INTRAMUSCULAR | Status: AC
  Administered 2019-02-08: 2 mg via INTRAVENOUS
  Filled 2019-02-08: qty 1

## 2019-02-08 NOTE — H&P (Signed)
TRH H&P    Patient Demographics:    Timothy Marks, is a 28 y.o. male  MRN: [id number]  DOB - 1990/12/21  Admit Date - 02/08/2019  Referring MD/NP/PA: Quintella Reichert  Outpatient Primary MD for the patient is Tresa Garter, MD  Patient coming from: home  Chief complaint- sickle cell pain   HPI:    Timothy Marks  is a 28 y.o. male, w sickle cell anemia, hx of osteonecrosis L shoulder,  depression, vitamin D def, apparently presents with complaints of generalized pain (arms, legs, back)  triggered by the cold weather.  Pt did not run out of pain medication per patient.  Pt  States that this generalized pain consistent with his sickle cell crisis in the past.    In ED,  T 98.9, P 78, R 20, Bp 104/50  Pox 100% on RA  Wbc 19.6, Hgb 9.9, Plt 499 Na 137, K 3.4 Bun 6, Creatinine 0.63 Ast 35, Alt 16 Alk phos 67, T. Bili 1.9   Urinalysis pending covid-19 pcr pending  Pt will be admitted for sickle cell crisis      Review of systems:    In addition to the HPI above,  No Fever-chills, No Headache, No changes with Vision or hearing, No problems swallowing food or Liquids, No Chest pain, Cough or Shortness of Breath, No Abdominal pain, No Nausea or Vomiting, bowel movements are regular, No Blood in stool or Urine, No dysuria, No new skin rashes or bruises,   No new weakness, tingling, numbness in any extremity, No recent weight gain or loss, No polyuria, polydypsia or polyphagia, No significant Mental Stressors.  All other systems reviewed and are negative.    Past History of the following :    Past Medical History:  Diagnosis Date  . Depression   . 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 Sickle cell anemia (HCC)   . Vitamin D deficiency y-6      Past Surgical History:  Procedure Laterality Date  . CHOLECYSTECTOMY         Social History:      Social History   Tobacco Use  . Smoking status: Never Smoker  . Smokeless tobacco: Never Used  Substance Use Topics  . Alcohol use: No       Family History :     Family History  Problem Relation Age of Onset  . Sickle cell trait Mother   . Sickle cell trait Father        Home Medications:   Prior to Admission medications   Medication Sig Start Date End Date Taking? Authorizing Provider  cholecalciferol (VITAMIN D) 1000 units tablet Take 2 tablets (2,000 Units total) by mouth daily. 12/13/17  Yes Azzie Glatter, FNP  folic acid (FOLVITE) 1 MG tablet Take 1 tablet (1 mg total) by mouth daily. 05/01/17  Yes Dorena Dew, FNP  gabapentin (NEURONTIN) 300 MG capsule TAKE 1 CAPSULE(300 MG) BY MOUTH THREE TIMES DAILY Patient taking differently: Take  300 mg by mouth 3 (three) times daily.  05/08/18  Yes Douglas, Andre, FNP  HYDROcodone-acetaminophen (NORCO/VICODIN) 5-325 MG tablet Take 1 tablet by mouth every 6 (six) hours as needed for moderate pain or severe pain.   Yes [provider]  hydroxyurea (HYDREA) 500 MG capsule Take 1 capsule (500 mg total) by mouth 2 (two) times daily. May take with food to minimize GI side effects. 05/01/17  Yes Hollis, Lachina M, FNP  ibuprofen (ADVIL,MOTRIN) 800 MG tablet Take 1 tablet (800 mg total) by mouth 3 (three) times daily as needed (pain). Patient taking differently: Take 800 mg by mouth 3 (three) times daily as needed for mild pain or moderate pain.  12/20/17  Yes Stroud, Natalie M, FNP  Nutritional Supplements (ENSURE ACTIVE HIGH PROTEIN) LIQD Take 1 each by mouth 3 (three) times daily between meals. #90 cans of Ensure 11/13/18  Yes Hollis, Lachina M, FNP  oxyCODONE (OXYCONTIN) 20 mg 12 hr tablet Take 1 tablet (20 mg total) by mouth every 12 (twelve) hours for 7 days. 02/06/19 02/13/19 Yes Hollis, Lachina M, FNP  oxyCODONE-acetaminophen (PERCOCET) 10-325 MG tablet Take 1 tablet by mouth every 6 (six)  hours as needed for up to 7 days for pain. 02/06/19 02/13/19 Yes Hollis, Lachina M, FNP     Allergies:     Allergies  Allergen Reactions  . Buprenorphine Hcl Hives  . Levaquin [Levofloxacin] Itching  . Meperidine Rash  . Morphine Hives  . Morphine And Related Hives  . Toradol [Ketorolac Tromethamine] Itching  . Tramadol Hives  . Vancomycin Itching  . Zosyn [Piperacillin Sod-Tazobactam So] Itching and Rash    Has taken rocephin in past     Physical Exam:   Vitals  Blood pressure (!) 104/50, pulse 78, temperature 98.9 F (37.2 C), temperature source Oral, resp. rate 20, SpO2 100 %.  1.  General: axoxo3  2. Psychiatric: euthymic  3. Neurologic: cn2-12 intact, reflexes 2+ symmetric, diffuse with no clonus, motor 5/5 in all 4 ext  4. HEENMT:  Anicteric, pupils 1.5mm symmetric, direct, consensual intact Neck: no jvd  5. Respiratory : CTAB  6. Cardiovascular : rrr s1, s2, no m/g/r  7. Gastrointestinal:  Abd: soft, nt, nd, +bs  8. Skin:  Ext: no c/c/e, no rash  9.Musculoskeletal:  Good ROM    Data Review:    CBC Recent Labs  Lab 02/08/19 1511  WBC 19.6*  HGB 9.9*  HCT 28.9*  PLT 499*  MCV 88.9  MCH 30.5  MCHC 34.3  RDW 18.6*  LYMPHSABS 3.7  MONOABS 1.3*  EOSABS 0.3  BASOSABS 0.1   ------------------------------------------------------------------------------------------------------------------  Results for orders placed or performed during the hospital encounter of 02/08/19 (from the past 48 hour(s))  CBC with Differential     Status: Abnormal   Collection Time: 02/08/19  3:11 PM  Result Value Ref Range   WBC 19.6 (H) 4.0 - 10.5 K/uL   RBC 3.25 (L) 4.22 - 5.81 MIL/uL   Hemoglobin 9.9 (L) 13.0 - 17.0 g/dL   HCT 28.9 (L) 39.0 - 52.0 %   MCV 88.9 80.0 - 100.0 fL   MCH 30.5 26.0 - 34.0 pg   MCHC 34.3 30.0 - 36.0 g/dL   RDW 18.6 (H) 11.5 - 15.5 %   Platelets 499 (H) 150 - 400 K/uL   nRBC 1.1 (H) 0.0 - 0.2 %   Neutrophils Relative % 73 %    Neutro Abs 14.2 (H) 1.7 - 7.7 K/uL   Lymphocytes Relative 19 %     Lymphs Abs 3.7 0.7 - 4.0 K/uL   Monocytes Relative 6 %   Monocytes Absolute 1.3 (H) 0.1 - 1.0 K/uL   Eosinophils Relative 1 %   Eosinophils Absolute 0.3 0.0 - 0.5 K/uL   Basophils Relative 0 %   Basophils Absolute 0.1 0.0 - 0.1 K/uL   Immature Granulocytes 1 %   Abs Immature Granulocytes 0.16 (H) 0.00 - 0.07 K/uL   Howell Jolly Bodies PRESENT    Polychromasia PRESENT    Sickle Cells PRESENT    Target Cells PRESENT     Comment: Performed at [facility], [address] [place], [place] [address]  Comprehensive metabolic panel     Status: Abnormal   Collection Time: 02/08/19  3:11 PM  Result Value Ref Range   Sodium 137 135 - 145 mmol/L   Potassium 3.4 (L) 3.5 - 5.1 mmol/L   Chloride 105 98 - 111 mmol/L   CO2 23 22 - 32 mmol/L   Glucose, Bld 116 (H) 70 - 99 mg/dL   BUN 6 6 - 20 mg/dL   Creatinine, Ser 0.63 0.61 - 1.24 mg/dL   Calcium 9.5 8.9 - 10.3 mg/dL   Total Protein 8.3 (H) 6.5 - 8.1 g/dL   Albumin 4.7 3.5 - 5.0 g/dL   AST 35 15 - 41 U/L   ALT 16 0 - 44 U/L   Alkaline Phosphatase 67 38 - 126 U/L   Total Bilirubin 1.9 (H) 0.3 - 1.2 mg/dL   GFR calc non Af Amer >60 >60 mL/min   GFR calc Af Amer >60 >60 mL/min   Anion gap 9 5 - 15    Comment: Performed at [facility], [address] [place], [place] [address]  Reticulocytes     Status: Abnormal   Collection Time: 02/08/19  3:12 PM  Result Value Ref Range   Retic Ct Pct 10.4 (H) 0.4 - 3.1 %   RBC. 3.26 (L) 4.22 - 5.81 MIL/uL   Retic Count, Absolute 339.7 (H) 19.0 - 186.0 K/uL   Immature Retic Fract 41.1 (H) 2.3 - 15.9 %    Comment: Performed at [facility], [address] [place], [place] [address]    Chemistries  Recent Labs  Lab 02/08/19 1511  NA 137  K 3.4*  CL 105  CO2 23  GLUCOSE 116*  BUN 6  CREATININE 0.63  CALCIUM 9.5  AST 35  ALT 16  ALKPHOS 67  BILITOT 1.9*    ------------------------------------------------------------------------------------------------------------------  ------------------------------------------------------------------------------------------------------------------ GFR: CrCl cannot be calculated (Unknown ideal weight.). Liver Function Tests: Recent Labs  Lab 02/08/19 1511  AST 35  ALT 16  ALKPHOS 67  BILITOT 1.9*  PROT 8.3*  ALBUMIN 4.7   No results for input(s): LIPASE, AMYLASE in the last 168 hours. No results for input(s): AMMONIA in the last 168 hours. Coagulation Profile: No results for input(s): INR, PROTIME in the last 168 hours. Cardiac Enzymes: No results for input(s): CKTOTAL, CKMB, CKMBINDEX, TROPONINI in the last 168 hours. BNP (last 3 results) No results for input(s): PROBNP in the last 8760 hours. HbA1C: No results for input(s): HGBA1C in the last 72 hours. CBG: No results for input(s): GLUCAP in the last 168 hours. Lipid Profile: No results for input(s): CHOL, HDL, LDLCALC, TRIG, CHOLHDL, LDLDIRECT in the last 72 hours. Thyroid Function Tests: No results for input(s): TSH, T4TOTAL, FREET4, T3FREE, THYROIDAB in the last 72 hours. Anemia Panel: Recent Labs    02/08/19 1512  RETICCTPCT 10.4*    ---------------------------------------------------------------------------------------------------------------   Urine analysis:    Component Value Date/Time   COLORURINE YELLOW 07/26/2018 1721   APPEARANCEUR CLOUDY (A) 07/26/2018 1721   LABSPEC 1.005 07/26/2018 1721   PHURINE 6.0 07/26/2018 1721   GLUCOSEU NEGATIVE 07/26/2018 1721   HGBUR SMALL (A) 07/26/2018 1721   BILIRUBINUR neg 01/15/2019 1406   KETONESUR NEGATIVE 07/26/2018 1721   PROTEINUR Positive (A) 01/15/2019 1406   PROTEINUR NEGATIVE 07/26/2018 1721   UROBILINOGEN 4.0 (A) 01/15/2019 1406   UROBILINOGEN 2.0 (H) 05/01/2017 0955   NITRITE neg 01/15/2019 1406   NITRITE NEGATIVE 07/26/2018 1721   LEUKOCYTESUR Negative 01/15/2019  1406   LEUKOCYTESUR LARGE (A) 07/26/2018 1721      Imaging Results:    No results found.     Assessment & Plan:    Principal Problem:   Sickle cell crisis (HCC) Active Problems:   Hypokalemia  Sickle cell crisis 1/2 ns at 150 mL per hour Dilaudid pca Benadryl 25mg iv q4h prn itching Zofran 4mg iv q6h prn nausea  Hypokalemia Replete Check cmp in am  Leukocytosis ? pain CXR pending  Please f/u on urinalysis, CXR  Anemia, Thrombocytosis Check cbc in am  DVT Prophylaxis-   Lovenox - SCDs   AM Labs Ordered, also please review Full Orders  Family Communication: Admission, patients condition and plan of care including tests being ordered have been discussed with the patient  who indicate understanding and agree with the plan and Code Status.  Code Status:  FULL CODE per patient   Admission status: Inpatient: Based on patients clinical presentation and evaluation of above clinical data, I have made determination that patient meets Inpatient criteria at this time. Pt will require iv fluids and dilaudid pca for sickle cell crisis,  Pt has high risk of clinical deterioration,  Pt will require > 2 nites stay.   Time spent in minutes : 55 minutes     M.D on 02/08/2019 at 7:25 PM          

## 2019-02-08 NOTE — ED Notes (Signed)
Patient has repeatedly called out every 5-10 minutes through call bell asking for more pain medicine and ice water since arrival. Patient also has been screaming for "help" and "nurse" continuously. Patient has been given ice water 4-5 times already. RN has told patient that he has already had his 3 rounds of dilaudid and we cannot give him anymore in the ED. Provider has been notified that patient has been given all dosages of dilaudid and his IV fluids. Multiple staff have tried explaining to patient the same thing as RN with no success.

## 2019-02-08 NOTE — ED Provider Notes (Signed)
[facility] DEPT Provider Note   CSN: [id number] Arrival date & time: 02/08/19  1433     History Chief Complaint  Patient presents with  . Sickle Cell Pain Crisis    Timothy Marks is a 28 y.o. male.  The history is provided by the patient and medical records. No language interpreter was used.  Sickle Cell Pain Crisis  Timothy Marks is a 28 y.o. male who presents to the Emergency Department complaining of sickle cell pain crisis. He presents the emergency department complaining of sickle cell pain crisis that began last night. He complains of pain throughout his entire body and inability to move his legs. He states that this is typical for his pain crisis. Movement makes his pain worse. He denies any fevers, nausea, vomiting, abdominal pain, shortness of breath. No known COVID 19 exposures. He states that he took his home Percocet 10 as well as OxyContin 20 for pain with no improvement. He denies any additional symptoms.    Past Medical History:  Diagnosis Date  . Depression   . 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 Sickle cell anemia (HCC)   . Vitamin D deficiency y-6    Patient Active Problem List   Diagnosis Date Noted  . Abnormal liver function 11/04/2018  . Sickle cell anemia with crisis ([place]) 05/22/2018  . Acute bilateral low back pain without sciatica   . Other chronic pain   . Anemia of chronic disease   . 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 Physical deconditioning   . Agitation   . Chronic pain syndrome   . Recurrent cold sores   . Fever   . Pain   . Sickle cell crisis ([place]) 07/16/2017  . Adjustment disorder with mixed disturbance of emotions and conduct   . Constipation 08/03/2014  . 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 Protein-calorie malnutrition, severe ([place]) 04/09/2014  . Major depressive disorder, recurrent, severe without psychotic features ([place])   . Osteonecrosis in diseases  classified elsewhere, left shoulder ([place])   . Vitamin D deficiency   . Sickle cell anemia ([place]) 03/30/2014  . Hyperbilirubinemia 03/30/2014  . Hypokalemia 02/07/2014  . Aggressive behavior 01/25/2013  . Sickle cell anemia with pain ([place]) 01/24/2013  . Sickle cell pain crisis ([place]) 01/24/2013    Past Surgical History:  Procedure Laterality Date  . CHOLECYSTECTOMY         Family History  Problem Relation Age of Onset  . Sickle cell trait Mother   . Sickle cell trait Father     Social History   Tobacco Use  . Smoking status: Never Smoker  . Smokeless tobacco: Never Used  Substance Use Topics  . Alcohol use: No  . Drug use: Not Currently    Types: Marijuana    Home Medications Prior to Admission medications   Medication Sig Start Date End Date Taking? Authorizing Provider  cholecalciferol (VITAMIN D) 1000 units tablet Take 2 tablets (2,000 Units total) by mouth daily. 12/13/17  Yes Azzie Glatter, FNP  folic acid (FOLVITE) 1 MG tablet Take 1 tablet (1 mg total) by mouth daily. 05/01/17  Yes Dorena Dew, FNP  gabapentin (NEURONTIN) 300 MG capsule TAKE 1 CAPSULE(300 MG) BY MOUTH THREE TIMES DAILY Patient taking differently: Take 300 mg by mouth 3 (three) times daily.  05/08/18  Yes Lanae Boast, FNP  HYDROcodone-acetaminophen (NORCO/VICODIN) 5-325 MG tablet Take 1 tablet by mouth every 6 (six) hours as needed  for moderate pain or severe pain.   Yes [provider]  hydroxyurea (HYDREA) 500 MG capsule Take 1 capsule (500 mg total) by mouth 2 (two) times daily. May take with food to minimize GI side effects. 05/01/17  Yes Dorena Dew, FNP  ibuprofen (ADVIL,MOTRIN) 800 MG tablet Take 1 tablet (800 mg total) by mouth 3 (three) times daily as needed (pain). Patient taking differently: Take 800 mg by mouth 3 (three) times daily as needed for mild pain or moderate pain.  12/20/17  Yes Azzie Glatter, FNP  Nutritional Supplements (ENSURE ACTIVE HIGH PROTEIN)  LIQD Take 1 each by mouth 3 (three) times daily between meals. #90 cans of Ensure 11/13/18  Yes Dorena Dew, FNP  oxyCODONE (OXYCONTIN) 20 mg 12 hr tablet Take 1 tablet (20 mg total) by mouth every 12 (twelve) hours for 7 days. 02/06/19 02/13/19 Yes Dorena Dew, FNP  oxyCODONE-acetaminophen (PERCOCET) 10-325 MG tablet Take 1 tablet by mouth every 6 (six) hours as needed for up to 7 days for pain. 02/06/19 02/13/19 Yes Dorena Dew, FNP    Allergies    Buprenorphine hcl, Levaquin [levofloxacin], Meperidine, Morphine, Morphine and related, Toradol [ketorolac tromethamine], Tramadol, Vancomycin, and Zosyn [piperacillin sod-tazobactam so]  Review of Systems   Review of Systems  All other systems reviewed and are negative.   Physical Exam Updated Vital Signs BP 114/61   Pulse 86   Temp 98.9 F (37.2 C) (Oral)   Resp 20   SpO2 96%   Physical Exam Vitals and nursing note reviewed.  Constitutional:      Appearance: He is well-developed.  HENT:     Head: Normocephalic and atraumatic.  Cardiovascular:     Rate and Rhythm: Normal rate and regular rhythm.     Heart sounds: No murmur.  Pulmonary:     Effort: Pulmonary effort is normal. No respiratory distress.     Breath sounds: Normal breath sounds.  Abdominal:     Palpations: Abdomen is soft.     Tenderness: There is no abdominal tenderness. There is no guarding or rebound.  Musculoskeletal:     Comments: 2+ DP pulses bilaterally. Patient has pain throughout all four extremities, greatest in his legs but no obvious deformity, erythema or edema.  Skin:    General: Skin is warm and dry.  Neurological:     Mental Status: He is alert and oriented to person, place, and time.  Psychiatric:     Comments: Anxious appearing     ED Results / Procedures / Treatments   Labs (all labs ordered are listed, but only abnormal results are displayed) Labs Reviewed  CBC WITH DIFFERENTIAL/PLATELET - Abnormal; Notable for the  following components:      Result Value   WBC 19.6 (*)    RBC 3.25 (*)    Hemoglobin 9.9 (*)    HCT 28.9 (*)    RDW 18.6 (*)    Platelets 499 (*)    nRBC 1.1 (*)    Neutro Abs 14.2 (*)    Monocytes Absolute 1.3 (*)    Abs Immature Granulocytes 0.16 (*)    All other components within normal limits  COMPREHENSIVE METABOLIC PANEL - Abnormal; Notable for the following components:   Potassium 3.4 (*)    Glucose, Bld 116 (*)    Total Protein 8.3 (*)    Total Bilirubin 1.9 (*)    All other components within normal limits  RETICULOCYTES - Abnormal; Notable for the following components:  Retic Ct Pct 10.4 (*)    RBC. 3.26 (*)    Retic Count, Absolute 339.7 (*)    Immature Retic Fract 41.1 (*)    All other components within normal limits  SARS CORONAVIRUS 2 (TAT 6-24 HRS)  URINALYSIS, ROUTINE W REFLEX MICROSCOPIC  COMPREHENSIVE METABOLIC PANEL  CBC WITH DIFFERENTIAL/PLATELET    EKG None  Radiology No results found.  Procedures Procedures (including critical care time)  Medications Ordered in ED Medications  HYDROmorphone (DILAUDID) injection 2 mg (has no administration in time range)    Or  HYDROmorphone (DILAUDID) injection 2 mg (has no administration in time range)  HYDROmorphone (DILAUDID) injection 2 mg (2 mg Intravenous Given 02/08/19 1755)    Or  HYDROmorphone (DILAUDID) injection 2 mg ( Subcutaneous See Alternative 02/08/19 1755)  0.45 % sodium chloride infusion ( Intravenous New Bag/Given 02/08/19 1755)  senna-docusate (Senokot-S) tablet 1 tablet (has no administration in time range)  polyethylene glycol (MIRALAX / GLYCOLAX) packet 17 g (has no administration in time range)  enoxaparin (LOVENOX) injection 40 mg (has no administration in time range)  naloxone (NARCAN) injection 0.4 mg (has no administration in time range)    And  sodium chloride flush (NS) 0.9 % injection 9 mL (has no administration in time range)  ondansetron (ZOFRAN) injection 4 mg (has no  administration in time range)  diphenhydrAMINE (BENADRYL) capsule 25 mg (has no administration in time range)    Or  diphenhydrAMINE (BENADRYL) 25 mg in sodium chloride 0.9 % 50 mL IVPB (has no administration in time range)  HYDROmorphone (DILAUDID) 1 mg/mL PCA injection (has no administration in time range)  HYDROmorphone (DILAUDID) injection 2 mg (2 mg Intravenous Given 02/08/19 1558)    Or  HYDROmorphone (DILAUDID) injection 2 mg ( Subcutaneous See Alternative 02/08/19 1558)  HYDROmorphone (DILAUDID) injection 2 mg (2 mg Intravenous Given 02/08/19 1642)    Or  HYDROmorphone (DILAUDID) injection 2 mg ( Subcutaneous See Alternative 02/08/19 1642)  HYDROmorphone (DILAUDID) injection 2 mg (2 mg Intravenous Given 02/08/19 1933)    ED Course  I have reviewed the triage vital signs and the nursing notes.  Pertinent labs & imaging results that were available during my care of the patient were reviewed by me and considered in my medical decision making (see chart for details).    MDM Rules/Calculators/A&P                     patient here for evaluation of body pain. Has a history of sickle cell and feels like this is his pain crisis. He is uncomfortable appearing on evaluation. He was treated with multiple doses of pain medications with no significant improvement in his pain. Labs with leukocytosis, stable anemia and appropriate reticular site count. No current evidence of infection. Discussed with patient findings of studies. Discussed admission for pain control and he is in agreement with plan. Hospitalist consulted for admission.  Final Clinical Impression(s) / ED Diagnoses Final diagnoses:  Sickle cell pain crisis [facility])    Rx / [place] Orders ED Discharge Orders    None       Quintella Reichert, MD 02/08/19 1942

## 2019-02-08 NOTE — ED Notes (Signed)
Patient has been given ice water at this time.

## 2019-02-08 NOTE — ED Notes (Addendum)
Patient requested BP cuff stay off until "sickle cell pain starts to feel better".

## 2019-02-08 NOTE — Progress Notes (Signed)
Patient is calling and crying and hollering about how much pain he's in. While nurse is trying to notify Dr about  Increase in his Dilaudid PCA dose he's demanding that nurse help him with his covers.  He's talking "about why me on this day. Very impatient.  While again trying to notify MD  Again. he's complaining about closing the bathroom door.

## 2019-02-08 NOTE — ED Triage Notes (Signed)
Pt BIB EMS from home. Pt c/o sickle cell pain in legs for more than 2 hours, pt states he has been unable to walk. Pt states home meds did not help.

## 2019-02-08 NOTE — ED Notes (Signed)
Patient repeatedly screaming at top of lungs out of room "help" that he is in pain and needs ice water. Patient had been given ice water and was informed that RN has to wait for MD to see patient and put in orders before pain medicine can be given.

## 2019-02-08 NOTE — Progress Notes (Signed)
Notified on call that that would like his PCA to increase to 2 mg per hour.

## 2019-02-08 NOTE — ED Notes (Addendum)
Patient has been given ice water at this time.

## 2019-02-08 NOTE — Progress Notes (Addendum)
Dr. Georges Mouse came and saw patient and patient was still hollering and crying about increase in his PCA demand dose.  MD said no but gave orders for one time dose of dilaudid 0.5 mg now.  Also.M. Sharlet Salina called and said she could not change night time PCA  dose as ordered.  Patient is receiving hydration fluid at 150 and has K-Pad.  Patient complains that his pain is in his arm legs, back and neck. Pretty much every where. And is 10/10  Patient is also on cardiac monitoring.

## 2019-02-09 ENCOUNTER — Inpatient Hospital Stay (HOSPITAL_COMMUNITY): Payer: Medicaid Other

## 2019-02-09 DIAGNOSIS — D5700 Hb-SS disease with crisis, unspecified: Principal | ICD-10-CM

## 2019-02-09 LAB — CBC WITH DIFFERENTIAL/PLATELET
Abs Immature Granulocytes: 0.17 10*3/uL — ABNORMAL HIGH (ref 0.00–0.07)
Basophils Absolute: 0 10*3/uL (ref 0.0–0.1)
Basophils Relative: 0 %
Eosinophils Absolute: 0.1 10*3/uL (ref 0.0–0.5)
Eosinophils Relative: 0 %
HCT: 29 % — ABNORMAL LOW (ref 39.0–52.0)
Hemoglobin: 9.9 g/dL — ABNORMAL LOW (ref 13.0–17.0)
Immature Granulocytes: 1 %
Lymphocytes Relative: 12 %
Lymphs Abs: 2.4 10*3/uL (ref 0.7–4.0)
MCH: 30.7 pg (ref 26.0–34.0)
MCHC: 34.1 g/dL (ref 30.0–36.0)
MCV: 90.1 fL (ref 80.0–100.0)
Monocytes Absolute: 2.1 10*3/uL — ABNORMAL HIGH (ref 0.1–1.0)
Monocytes Relative: 11 %
Neutro Abs: 15.2 10*3/uL — ABNORMAL HIGH (ref 1.7–7.7)
Neutrophils Relative %: 76 %
Platelets: 420 10*3/uL — ABNORMAL HIGH (ref 150–400)
RBC: 3.22 MIL/uL — ABNORMAL LOW (ref 4.22–5.81)
RDW: 18.4 % — ABNORMAL HIGH (ref 11.5–15.5)
WBC: 19.9 10*3/uL — ABNORMAL HIGH (ref 4.0–10.5)
nRBC: 2 % — ABNORMAL HIGH (ref 0.0–0.2)

## 2019-02-09 LAB — COMPREHENSIVE METABOLIC PANEL
ALT: 15 U/L (ref 0–44)
AST: 30 U/L (ref 15–41)
Albumin: 4.2 g/dL (ref 3.5–5.0)
Alkaline Phosphatase: 69 U/L (ref 38–126)
Anion gap: 10 (ref 5–15)
BUN: 8 mg/dL (ref 6–20)
CO2: 25 mmol/L (ref 22–32)
Calcium: 8.9 mg/dL (ref 8.9–10.3)
Chloride: 98 mmol/L (ref 98–111)
Creatinine, Ser: 0.56 mg/dL — ABNORMAL LOW (ref 0.61–1.24)
GFR calc Af Amer: >60 mL/min (ref >60)
GFR calc non Af Amer: >60 mL/min (ref >60)
Glucose, Bld: 104 mg/dL — ABNORMAL HIGH (ref 70–99)
Potassium: 3.6 mmol/L (ref 3.5–5.1)
Sodium: 133 mmol/L — ABNORMAL LOW (ref 135–145)
Total Bilirubin: 2.8 mg/dL — ABNORMAL HIGH (ref 0.3–1.2)
Total Protein: 7.7 g/dL (ref 6.5–8.1)

## 2019-02-09 LAB — URINALYSIS, ROUTINE W REFLEX MICROSCOPIC
Bacteria, UA: NONE SEEN
Bilirubin Urine: NEGATIVE
Glucose, UA: NEGATIVE mg/dL
Ketones, ur: NEGATIVE mg/dL
Leukocytes,Ua: NEGATIVE
Nitrite: NEGATIVE
Protein, ur: NEGATIVE mg/dL
Specific Gravity, Urine: 1.006 (ref 1.005–1.030)
pH: 7 (ref 5.0–8.0)

## 2019-02-09 LAB — SARS CORONAVIRUS 2 (TAT 6-24 HRS): SARS Coronavirus 2: NEGATIVE

## 2019-02-09 MED ORDER — HYDROXYUREA 500 MG PO CAPS
500.0000 mg | ORAL_CAPSULE | Freq: Two times a day (BID) | ORAL | Status: DC
  Administered 2019-02-09 – 2019-02-15 (×13): 500 mg via ORAL
  Filled 2019-02-09 (×14): qty 1

## 2019-02-09 MED ORDER — LIP MEDEX EX OINT
TOPICAL_OINTMENT | CUTANEOUS | Status: AC
  Administered 2019-02-09: 1
  Filled 2019-02-09: qty 7

## 2019-02-09 MED ORDER — GABAPENTIN 300 MG PO CAPS
300.0000 mg | ORAL_CAPSULE | Freq: Three times a day (TID) | ORAL | Status: DC
  Administered 2019-02-09 – 2019-02-15 (×19): 300 mg via ORAL
  Filled 2019-02-09 (×19): qty 1

## 2019-02-09 MED ORDER — ENSURE ENLIVE PO LIQD
237.0000 mL | Freq: Three times a day (TID) | ORAL | Status: DC
  Administered 2019-02-09 – 2019-02-15 (×13): 237 mL via ORAL

## 2019-02-09 MED ORDER — VITAMIN D3 25 MCG (1000 UNIT) PO TABS
2000.0000 [IU] | ORAL_TABLET | Freq: Every day | ORAL | Status: DC
  Administered 2019-02-09 – 2019-02-15 (×7): 2000 [IU] via ORAL
  Filled 2019-02-09 (×7): qty 2

## 2019-02-09 MED ORDER — FOLIC ACID 1 MG PO TABS
1.0000 mg | ORAL_TABLET | Freq: Every day | ORAL | Status: DC
  Administered 2019-02-09 – 2019-02-15 (×7): 1 mg via ORAL
  Filled 2019-02-09 (×7): qty 1

## 2019-02-09 MED ORDER — OXYCODONE HCL ER 20 MG PO T12A
20.0000 mg | EXTENDED_RELEASE_TABLET | Freq: Two times a day (BID) | ORAL | Status: DC
  Administered 2019-02-09 – 2019-02-15 (×13): 20 mg via ORAL
  Filled 2019-02-09 (×13): qty 1

## 2019-02-09 MED ORDER — LORAZEPAM 1 MG PO TABS
1.0000 mg | ORAL_TABLET | Freq: Once | ORAL | Status: AC
  Administered 2019-02-09: 1 mg via ORAL
  Filled 2019-02-09: qty 1

## 2019-02-09 NOTE — Progress Notes (Signed)
Report given to Maudie Mercury on 6E. Patient will be transferred to unit for further evaluation and treatment.

## 2019-02-09 NOTE — Progress Notes (Addendum)
Subjective: Timothy Marks, a 28 year old male with a medical history significant for sickle cell disease, chronic pain syndrome, opiate dependence and tolerance, history of anemia of chronic disease, and history of oppositional defiance disorder was admitted for sickle cell pain crisis. Patient was in usual state of health until yesterday.  He states the pain intensity increased and was uncontrolled by home medications.  He attributes pain crisis to family stressors and changes in weather. Pain intensity is 10/10.  Patient is writhing in bed.  He says that he cannot get comfortable.  Pain is primarily to lower back.  He characterizes pain as sharp and throbbing.  He denies headache, chest pain, dizziness, paresthesias, dysuria, nausea, vomiting, or diarrhea.  Objective:  Vital signs in last 24 hours:  Vitals:   02/09/19 0030 02/09/19 0348 02/09/19 0356 02/09/19 0847  BP:   135/71   Pulse:   73   Resp: 15 20 20 15   Temp:   99 F (37.2 C)   TempSrc:   Oral   SpO2: 100% 100%      Intake/Output from previous day:   Intake/Output Summary (Last 24 hours) at 02/09/2019 1306 Last data filed at 02/09/2019 0845 Gross per 24 hour  Intake 2113.37 ml  Output 2425 ml  Net -311.63 ml    Physical Exam: General: Alert, awake, oriented x3, in no acute distress.  HEENT: [place] U/AT PEERL, EOMI Neck: Trachea midline,  no masses, no thyromegal,y no JVD, no carotid bruit OROPHARYNX:  Moist, No exudate/ erythema/lesions.  Heart: Regular rate and rhythm, without murmurs, rubs, gallops, PMI non-displaced, no heaves or thrills on palpation.  Lungs: Clear to auscultation, no wheezing or rhonchi noted. No increased vocal fremitus resonant to percussion  Abdomen: Soft, nontender, nondistended, positive bowel sounds, no masses no hepatosplenomegaly noted..  Neuro: No focal neurological deficits noted cranial nerves II through XII grossly intact. DTRs 2+ bilaterally upper and lower extremities. Strength 5 out  of 5 in bilateral upper and lower extremities. Musculoskeletal: No warm swelling or erythema around joints, no spinal tenderness noted. Psychiatric: Patient alert and oriented x3, good insight and cognition, good recent to remote recall. Lymph node survey: No cervical axillary or inguinal lymphadenopathy noted.  Lab Results:  Basic Metabolic Panel:    Component Value Date/Time   NA 133 (L) 02/09/2019 0753   NA 139 08/31/2018 0854   K 3.6 02/09/2019 0753   CL 98 02/09/2019 0753   CO2 25 02/09/2019 0753   BUN 8 02/09/2019 0753   BUN 6 08/31/2018 0854   CREATININE 0.56 (L) 02/09/2019 0753   GLUCOSE 104 (H) 02/09/2019 0753   CALCIUM 8.9 02/09/2019 0753   CBC:    Component Value Date/Time   WBC 19.9 (H) 02/09/2019 0753   HGB 9.9 (L) 02/09/2019 0753   HGB 8.0 (L) 11/13/2018 1408   HCT 29.0 (L) 02/09/2019 0753   HCT 25.1 (L) 11/13/2018 1408   PLT 420 (H) 02/09/2019 0753   PLT 792 (H) 11/13/2018 1408   MCV 90.1 02/09/2019 0753   MCV 95 11/13/2018 1408   NEUTROABS 15.2 (H) 02/09/2019 0753   NEUTROABS 5.5 11/13/2018 1408   LYMPHSABS 2.4 02/09/2019 0753   LYMPHSABS 2.9 11/13/2018 1408   MONOABS 2.1 (H) 02/09/2019 0753   EOSABS 0.1 02/09/2019 0753   EOSABS 0.0 11/13/2018 1408   BASOSABS 0.0 02/09/2019 0753   BASOSABS 0.0 11/13/2018 1408    Recent Results (from the past 240 hour(s))  SARS CORONAVIRUS 2 (TAT 6-24 HRS) Nasopharyngeal Nasopharyngeal Swab  Status: None   Collection Time: 02/08/19  7:36 PM   Specimen: Nasopharyngeal Swab  Result Value Ref Range Status   SARS Coronavirus 2 NEGATIVE NEGATIVE Final    Comment: (NOTE) SARS-CoV-2 target nucleic acids are NOT DETECTED. The SARS-CoV-2 RNA is generally detectable in upper and lower respiratory specimens during the acute phase of infection. Negative results do not preclude SARS-CoV-2 infection, do not rule out co-infections with other pathogens, and should not be used as the sole basis for treatment or other patient  management decisions. Negative results must be combined with clinical observations, patient history, and epidemiological information. The expected result is Negative. Fact Sheet for Patients: SugarRoll.be Fact Sheet for Healthcare Providers: https://www.woods-mathews.com/ This test is not yet approved or cleared by the [place] FDA and  has been authorized for detection and/or diagnosis of SARS-CoV-2 by FDA under an Emergency Use Authorization (EUA). This EUA will remain  in effect (meaning this test can be used) for the duration of the COVID-19 declaration under Section 56 4(b)(1) of the Act, 21 U.S.C. section 360bbb-3(b)(1), unless the authorization is terminated or revoked sooner. Performed at [facility], [address], [place], [place] [address]     Studies/Results: [place] CHEST PORT 1 VIEW  Result Date: 02/09/2019 CLINICAL DATA:  Sickle cell crisis. EXAM: PORTABLE CHEST 1 VIEW COMPARISON:  11/04/2018 FINDINGS: The heart size and mediastinal contours are within normal limits. Both lungs are clear. The visualized skeletal structures are unremarkable. IMPRESSION: No active disease. Electronically Signed   By: Marin Olp M.D.   On: 02/09/2019 07:41    Medications: Scheduled Meds: . cholecalciferol  2,000 Units Oral Daily  . enoxaparin (LOVENOX) injection  40 mg Subcutaneous Q24H  . feeding supplement (ENSURE ENLIVE)  237 mL Oral TID BM  . folic acid  1 mg Oral Daily  . gabapentin  300 mg Oral TID  . HYDROmorphone   Intravenous Q4H  . hydroxyurea  500 mg Oral BID  . oxyCODONE  20 mg Oral Q12H  . senna-docusate  1 tablet Oral BID   Continuous Infusions: . sodium chloride 100 mL/hr at 02/09/19 1250  . diphenhydrAMINE     PRN Meds:.diphenhydrAMINE **OR** diphenhydrAMINE, naloxone **AND** sodium chloride flush, ondansetron (ZOFRAN) IV, polyethylene  glycol  Consultants:  None  Procedures:  None  Antibiotics:  None  Assessment/Plan: Principal Problem:   Sickle cell crisis (HCC) Active Problems:   Hypokalemia   Adjustment disorder with mixed disturbance of emotions and conduct   Chronic pain syndrome  Sickle cell disease with pain crisis: Decrease IV fluids.  0.45% saline at 100 mL/h Continue weightbase Dilaudid PCA with settings of 0.5 mg, 10-minute lockout, and 3 mg/h.  Also, 1 mg bolus every 2 hours for severe breakthrough pain. Supplemental oxygen as needed Reevaluate pain scale regularly and monitor vitals closely.  Leukocytosis: WBCs 19.9.  Patient afebrile.  Chest x-ray shows no acute cardiopulmonary process. No signs of infection or inflammation.  Continue to monitor closely.  Follow CBC in a.m.  Chronic pain syndrome: Restart OxyContin 20 mg every 12 hours Hold Percocet, use PCA Dilaudid as substitute.  Sickle cell anemia: Hemoglobin 9.9, consistent with patient's baseline.  No clinical indication for blood transfusion at this time.  Continue to monitor closely.  Hypokalemia: Resolved.  No further repletion warranted at this time.  Follow BMP in a.m.  Adjustment disorder with mixed disturbance of emotions and conduct: Patient agitated due to poor pain control, continue to monitory closely.    Code  Status: Full Code Family Communication: N/A Disposition Plan: Not yet ready for discharge  [place], MSN, FNP-C [facility] [address] [place], [place] [address] [phone]  If 7PM-7AM, please contact night-coverage.  02/09/2019, 1:06 PM  LOS: 1 day

## 2019-02-09 NOTE — Progress Notes (Signed)
rm [place], Patient hollering and is in a lot of pain 10/10 Said he is in severe pain. Can we get one time order for dilaudid  Again.  Please.

## 2019-02-10 ENCOUNTER — Inpatient Hospital Stay (HOSPITAL_COMMUNITY): Payer: Medicaid Other

## 2019-02-10 DIAGNOSIS — R6510 Systemic inflammatory response syndrome (SIRS) of non-infectious origin without acute organ dysfunction: Secondary | ICD-10-CM

## 2019-02-10 DIAGNOSIS — G894 Chronic pain syndrome: Secondary | ICD-10-CM

## 2019-02-10 DIAGNOSIS — F4325 Adjustment disorder with mixed disturbance of emotions and conduct: Secondary | ICD-10-CM

## 2019-02-10 DIAGNOSIS — E876 Hypokalemia: Secondary | ICD-10-CM

## 2019-02-10 LAB — OPIATES CONFIRMATION, URINE: Opiates: NEGATIVE ng/mL

## 2019-02-10 LAB — CBC WITH DIFFERENTIAL/PLATELET
Abs Immature Granulocytes: 0.15 10*3/uL — ABNORMAL HIGH (ref 0.00–0.07)
Basophils Absolute: 0 10*3/uL (ref 0.0–0.1)
Basophils Relative: 0 %
Eosinophils Absolute: 0 10*3/uL (ref 0.0–0.5)
Eosinophils Relative: 0 %
HCT: 25.5 % — ABNORMAL LOW (ref 39.0–52.0)
Hemoglobin: 8.6 g/dL — ABNORMAL LOW (ref 13.0–17.0)
Immature Granulocytes: 1 %
Lymphocytes Relative: 6 %
Lymphs Abs: 1.1 10*3/uL (ref 0.7–4.0)
MCH: 30 pg (ref 26.0–34.0)
MCHC: 33.7 g/dL (ref 30.0–36.0)
MCV: 88.9 fL (ref 80.0–100.0)
Monocytes Absolute: 1.8 10*3/uL — ABNORMAL HIGH (ref 0.1–1.0)
Monocytes Relative: 10 %
Neutro Abs: 14.7 10*3/uL — ABNORMAL HIGH (ref 1.7–7.7)
Neutrophils Relative %: 83 %
Platelets: 345 10*3/uL (ref 150–400)
RBC: 2.87 MIL/uL — ABNORMAL LOW (ref 4.22–5.81)
RDW: 16.4 % — ABNORMAL HIGH (ref 11.5–15.5)
WBC: 17.8 10*3/uL — ABNORMAL HIGH (ref 4.0–10.5)
nRBC: 1.3 % — ABNORMAL HIGH (ref 0.0–0.2)

## 2019-02-10 LAB — DRUG SCREEN 764883 11+OXYCO+ALC+CRT-BUND
Amphetamines, Urine: NEGATIVE ng/mL
BENZODIAZ UR QL: NEGATIVE ng/mL
Barbiturate: NEGATIVE ng/mL
Cocaine (Metabolite): NEGATIVE ng/mL
Creatinine: 134 mg/dL (ref 20.0–300.0)
Ethanol: NEGATIVE %
Meperidine: NEGATIVE ng/mL
Methadone Screen, Urine: NEGATIVE ng/mL
Phencyclidine: NEGATIVE ng/mL
Propoxyphene: NEGATIVE ng/mL
Tramadol: NEGATIVE ng/mL
pH, Urine: 7.7 (ref 4.5–8.9)

## 2019-02-10 LAB — OXYCODONE/OXYMORPHONE, CONFIRM
OXYCODONE/OXYMORPH: POSITIVE — AB
OXYCODONE: 727 ng/mL
OXYCODONE: POSITIVE — AB
OXYMORPHONE (GC/MS): >3000 ng/mL
OXYMORPHONE: POSITIVE — AB

## 2019-02-10 LAB — BASIC METABOLIC PANEL
Anion gap: 14 (ref 5–15)
BUN: 11 mg/dL (ref 6–20)
CO2: 22 mmol/L (ref 22–32)
Calcium: 9 mg/dL (ref 8.9–10.3)
Chloride: 98 mmol/L (ref 98–111)
Creatinine, Ser: 0.54 mg/dL — ABNORMAL LOW (ref 0.61–1.24)
GFR calc Af Amer: >60 mL/min (ref >60)
GFR calc non Af Amer: >60 mL/min (ref >60)
Glucose, Bld: 105 mg/dL — ABNORMAL HIGH (ref 70–99)
Potassium: 3.6 mmol/L (ref 3.5–5.1)
Sodium: 134 mmol/L — ABNORMAL LOW (ref 135–145)

## 2019-02-10 LAB — CBC
HCT: 26.6 % — ABNORMAL LOW (ref 39.0–52.0)
Hemoglobin: 9 g/dL — ABNORMAL LOW (ref 13.0–17.0)
MCH: 29.9 pg (ref 26.0–34.0)
MCHC: 33.8 g/dL (ref 30.0–36.0)
MCV: 88.4 fL (ref 80.0–100.0)
Platelets: 346 10*3/uL (ref 150–400)
RBC: 3.01 MIL/uL — ABNORMAL LOW (ref 4.22–5.81)
RDW: 17.2 % — ABNORMAL HIGH (ref 11.5–15.5)
WBC: 19.7 10*3/uL — ABNORMAL HIGH (ref 4.0–10.5)
nRBC: 1.8 % — ABNORMAL HIGH (ref 0.0–0.2)

## 2019-02-10 LAB — CANNABINOID CONFIRMATION, UR
CANNABINOIDS: POSITIVE — AB
Carboxy THC GC/MS Conf: >300 ng/mL

## 2019-02-10 LAB — LACTIC ACID, PLASMA
Lactic Acid, Venous: 0.9 mmol/L (ref 0.5–1.9)
Lactic Acid, Venous: 1.1 mmol/L (ref 0.5–1.9)

## 2019-02-10 MED ORDER — ACETAMINOPHEN 325 MG PO TABS
650.0000 mg | ORAL_TABLET | Freq: Four times a day (QID) | ORAL | Status: DC | PRN
  Administered 2019-02-10 – 2019-02-11 (×2): 650 mg via ORAL
  Filled 2019-02-10 (×2): qty 2

## 2019-02-10 MED ORDER — LORAZEPAM 2 MG/ML IJ SOLN
1.0000 mg | Freq: Once | INTRAMUSCULAR | Status: AC
  Administered 2019-02-10: 1 mg via INTRAVENOUS
  Filled 2019-02-10: qty 1

## 2019-02-10 MED ORDER — HALOPERIDOL 0.5 MG PO TABS
2.0000 mg | ORAL_TABLET | Freq: Three times a day (TID) | ORAL | Status: DC | PRN
  Administered 2019-02-10 – 2019-02-14 (×4): 2 mg via ORAL
  Filled 2019-02-10 (×4): qty 4

## 2019-02-10 NOTE — Progress Notes (Signed)
   Vital Signs MEWS/VS Documentation      02/10/2019 1604 02/10/2019 1623 02/10/2019 1639 02/10/2019 1652   MEWS Score:  4  3  4  2    MEWS Score Color:  Red  Yellow  Red  Yellow   Resp:  19  18  --  19   Pulse:  (!) 115  (!) 119  (!) 115  (!) 114   BP:  132/83  136/78  122/72  119/73   Temp:  (!) 103 F (39.4 C)  (!) 100.6 F (38.1 C)  (!) 102 F (38.9 C)  (!) 100.4 F (38 C)      Pt agitated and very resistant of RN obtaining VS. Pulling temp probe out, trying to remove BP cuff etc. PRN Haldol given earlier with Tylenol. Will continue to attempt VS and monitor pt.      [name] 02/10/2019,4:55 PM

## 2019-02-10 NOTE — Progress Notes (Signed)
Pt called RN to room, stating that PCA pump "doesn't work". Asked him to explain, he says he pushes the button but never gets any relief. Reviewed settings with pt, explained that he gets 0.5mg  each time he pushes button. Reviewed usage across this shift with pt, explained that he could give himself a lot more medication that what he has (clear volumes q4h have been 2, 4, 1.5mg - 12mg  is possible each time). Verified that pump was in fact working, comparing syringe replacement time with amount used since then, it was correct. PRN bolus dose of 1mg  given. He asked "where do we go from here?" I asked him to be sure and explain his pain levels and lack of relief to the MD on rounds this morning. We have assisted pt with repositioning and heat pack placement numerous times throughout shift. Continue to monitor. Hortencia Conradi RN

## 2019-02-10 NOTE — Progress Notes (Signed)
Subjective: Timothy Marks, a 28 year old male with a medical history significant for sickle cell disease, chronic pain syndrome, opiate dependence and tolerance, history of anemia of chronic disease, and history of oppositional defiance disorder was admitted for sickle cell pain crisis. Patient states the pain has not been controlled on IV Dilaudid PCA.  He says "I am not getting any medication, this does not work".  RN staff explained to patient that PCA is in the working, his syringes been double checked by staff.  Patient is writhing in bed, tearful, and has been watching videos all night pertaining to sickle cell disease and on online groups with sickle cell related chats.  Patient states, "I believe I am having a seizure".  Patient's pain intensity is 10/10 characterized as constant, sharp, and throbbing.  He says that he cannot get comfortable and has no pain relief from any interventions. He denies headache, chest pain, shortness of breath, urinary symptoms, nausea, vomiting, or diarrhea.  Objective:  Vital signs in last 24 hours:  Vitals:   02/10/19 0322 02/10/19 0659 02/10/19 0725 02/10/19 0949  BP:  132/79  134/75  Pulse:  (!) 108  (!) 104  Resp: 15 16 14 15   Temp:  100 F (37.8 C)  (!) 100.4 F (38 C)  TempSrc:  Oral  Oral  SpO2: 97% 97%  100%    Intake/Output from previous day:   Intake/Output Summary (Last 24 hours) at 02/10/2019 1000 Last data filed at 02/10/2019 [id number] Gross per 24 hour  Intake 26 ml  Output 500 ml  Net -474 ml    Physical Exam Constitutional:      General: He is in acute distress.  HENT:     Head: Normocephalic.     Nose: Nose normal.  Eyes:     General: No scleral icterus.    Pupils: Pupils are equal, round, and reactive to light.  Cardiovascular:     Rate and Rhythm: Normal rate and regular rhythm.     Pulses: Normal pulses.  Pulmonary:     Effort: Pulmonary effort is normal.     Breath sounds: Normal breath sounds.  Abdominal:   General: Bowel sounds are normal.  Musculoskeletal:        General: Normal range of motion.  Skin:    General: Skin is warm.  Neurological:     General: No focal deficit present.     Mental Status: He is alert and oriented to person, place, and time. Mental status is at baseline.  Psychiatric:        Attention and Perception: He is attentive. He does not perceive auditory or visual hallucinations.        Mood and Affect: Mood is anxious. Affect is labile and tearful.        Speech: Speech normal.        Behavior: Behavior is agitated.        Thought Content: Thought content is paranoid. Thought content does not include homicidal or suicidal ideation.        Judgment: Judgment is inappropriate.     Lab Results:  Basic Metabolic Panel:    Component Value Date/Time   NA 133 (L) 02/09/2019 0753   NA 139 08/31/2018 0854   K 3.6 02/09/2019 0753   CL 98 02/09/2019 0753   CO2 25 02/09/2019 0753   BUN 8 02/09/2019 0753   BUN 6 08/31/2018 0854   CREATININE 0.56 (L) 02/09/2019 0753   GLUCOSE 104 (H) 02/09/2019 0753   CALCIUM  8.9 02/09/2019 0753   CBC:    Component Value Date/Time   WBC 19.9 (H) 02/09/2019 0753   HGB 9.9 (L) 02/09/2019 0753   HGB 8.0 (L) 11/13/2018 1408   HCT 29.0 (L) 02/09/2019 0753   HCT 25.1 (L) 11/13/2018 1408   PLT 420 (H) 02/09/2019 0753   PLT 792 (H) 11/13/2018 1408   MCV 90.1 02/09/2019 0753   MCV 95 11/13/2018 1408   NEUTROABS 15.2 (H) 02/09/2019 0753   NEUTROABS 5.5 11/13/2018 1408   LYMPHSABS 2.4 02/09/2019 0753   LYMPHSABS 2.9 11/13/2018 1408   MONOABS 2.1 (H) 02/09/2019 0753   EOSABS 0.1 02/09/2019 0753   EOSABS 0.0 11/13/2018 1408   BASOSABS 0.0 02/09/2019 0753   BASOSABS 0.0 11/13/2018 1408    Recent Results (from the past 240 hour(s))  SARS CORONAVIRUS 2 (TAT 6-24 HRS) Nasopharyngeal Nasopharyngeal Swab     Status: None   Collection Time: 02/08/19  7:36 PM   Specimen: Nasopharyngeal Swab  Result Value Ref Range Status   SARS  Coronavirus 2 NEGATIVE NEGATIVE Final    Comment: (NOTE) SARS-CoV-2 target nucleic acids are NOT DETECTED. The SARS-CoV-2 RNA is generally detectable in upper and lower respiratory specimens during the acute phase of infection. Negative results do not preclude SARS-CoV-2 infection, do not rule out co-infections with other pathogens, and should not be used as the sole basis for treatment or other patient management decisions. Negative results must be combined with clinical observations, patient history, and epidemiological information. The expected result is Negative. Fact Sheet for Patients: SugarRoll.be Fact Sheet for Healthcare Providers: https://www.woods-mathews.com/ This test is not yet approved or cleared by the [place] FDA and  has been authorized for detection and/or diagnosis of SARS-CoV-2 by FDA under an Emergency Use Authorization (EUA). This EUA will remain  in effect (meaning this test can be used) for the duration of the COVID-19 declaration under Section 56 4(b)(1) of the Act, 21 U.S.C. section 360bbb-3(b)(1), unless the authorization is terminated or revoked sooner. Performed at [facility], [place] [address], [place], [place] [address]     Studies/Results: [place] CHEST PORT 1 VIEW  Result Date: 02/09/2019 CLINICAL DATA:  Sickle cell crisis. EXAM: PORTABLE CHEST 1 VIEW COMPARISON:  11/04/2018 FINDINGS: The heart size and mediastinal contours are within normal limits. Both lungs are clear. The visualized skeletal structures are unremarkable. IMPRESSION: No active disease. Electronically Signed   By: Marin Olp M.D.   On: 02/09/2019 07:41    Medications: Scheduled Meds: . cholecalciferol  2,000 Units Oral Daily  . enoxaparin (LOVENOX) injection  40 mg Subcutaneous Q24H  . feeding supplement (ENSURE ENLIVE)  237 mL Oral TID BM  . folic acid  1 mg Oral Daily  . gabapentin  300 mg Oral TID  . HYDROmorphone    Intravenous Q4H  . hydroxyurea  500 mg Oral BID  . LORazepam  1 mg Intravenous Once  . oxyCODONE  20 mg Oral Q12H  . senna-docusate  1 tablet Oral BID   Continuous Infusions: . sodium chloride 100 mL/hr at 02/09/19 1250  . diphenhydrAMINE     PRN Meds:.diphenhydrAMINE **OR** diphenhydrAMINE, naloxone **AND** sodium chloride flush, ondansetron (ZOFRAN) IV, polyethylene glycol  Consultants:  None  Procedures:  None  Antibiotics:  None  Assessment/Plan: Principal Problem:   Sickle cell crisis (HCC) Active Problems:   Hypokalemia   Adjustment disorder with mixed disturbance of emotions and conduct   Chronic pain syndrome  Sickle cell disease with pain crisis: Continue  IV fluids Continue weightbase Dilaudid PCA, no change in settings on today.  Will also continue 1 mg boluses every 2 hours for severe, breakthrough pain. Supplemental oxygen as needed Reevaluate pain scale regularly and monitor vital signs closely.  Leukocytosis: CBC pending.  Patient afebrile.  Chest x-ray showed no acute cardiopulmonary process.  No signs of infection or inflammation.  Continue to monitor closely.  Chronic pain syndrome: Stable.  Continue home medications.  Sickle cell anemia: CBC pending.  On yesterday, hemoglobin 9.9, which is consistent with patient's baseline.  No clinical indication for blood transfusion.  Continue to monitor closely.  Adjustment disorder with mixed disturbance of emotions and conduct: Patient is very agitated due to poor pain control.  Ativan 1 mg IV x1 dose. For increased agitation, Haldol 2 mg by mouth every 6 hours as needed for increased agitation  Code Status: Full Code Family Communication: N/A Disposition Plan: Not yet ready for discharge  [place], MSN, FNP-C Patient [place], [place] [address] [phone]  If 7PM-7AM, please contact night-coverage.  02/10/2019, 10:00 AM   LOS: 2 days

## 2019-02-11 MED ORDER — LORAZEPAM 2 MG/ML IJ SOLN
0.5000 mg | Freq: Once | INTRAMUSCULAR | Status: AC
  Administered 2019-02-11: 0.5 mg via INTRAVENOUS
  Filled 2019-02-11: qty 1

## 2019-02-11 NOTE — Progress Notes (Signed)
Subjective: Patient is a 28 year old male admitted with sickle cell painful crisis. Patient has had multiple admissions for sickle cell crisis. No fever no chills. Patient also has oppositional defiant disorder medication noncompliance. He is complaining of 10 out of 10 pain in the lower back and legs.  Objective: Vital signs in last 24 hours: Temp:  [99.1 F (37.3 C)-101 F (38.3 C)] 99.3 F (37.4 C) (12/28 1834) Pulse Rate:  [97-110] 105 (12/28 1834) Resp:  [14-19] 16 (12/28 1834) BP: (121-136)/(68-76) 124/70 (12/28 1834) SpO2:  [96 %-100 %] 100 % (12/28 1834) Weight change:  Last BM Date: 02/08/19  Intake/Output from previous day: 12/27 0701 - 12/28 0700 In: -  Out: 2250 [Urine:2250] Intake/Output this shift: Total I/O In: 6360.6 [P.O.:480; I.V.:5880.6] Out: 600 [Urine:600]  General appearance: alert, cooperative, appears stated age and no distress Neck: no adenopathy, no carotid bruit, no JVD, supple, symmetrical, trachea midline and thyroid not enlarged, symmetric, no tenderness/mass/nodules Back: symmetric, no curvature. ROM normal. No CVA tenderness. Resp: clear to auscultation bilaterally Cardio: regular rate and rhythm, S1, S2 normal, no murmur, click, rub or gallop GI: soft, non-tender; bowel sounds normal; no masses,  no organomegaly Extremities: extremities normal, atraumatic, no cyanosis or edema Neurologic: Grossly normal  Lab Results: Recent Labs    02/10/19 0940 02/10/19 1637  WBC 19.7* 17.8*  HGB 9.0* 8.6*  HCT 26.6* 25.5*  PLT 346 345   BMET Recent Labs    02/09/19 0753 02/10/19 0940  NA 133* 134*  K 3.6 3.6  CL 98 98  CO2 25 22  GLUCOSE 104* 105*  BUN 8 11  CREATININE 0.56* 0.54*  CALCIUM 8.9 9.0    Studies/Results: [place] Chest Port 1 View  Result Date: 02/10/2019 CLINICAL DATA:  Sickle cell crisis. Fever. EXAM: PORTABLE CHEST 1 VIEW COMPARISON:  02/09/2019 and CT of the chest dated 10/12/2016 FINDINGS: Heart size and pulmonary  vascularity are normal. Chronic slight prominence of the main pulmonary artery. Lungs are clear. Chronic sclerosis of the bones consistent with the patient's history of sickle cell disease. IMPRESSION: No acute abnormalities. Electronically Signed   By: Lorriane Shire M.D.   On: 02/10/2019 18:08    Medications: I have reviewed the patient's current medications.  Assessment/Plan: A 28 year old with sickle cell painful crisis  #1 sickle cell painful crisis: Patient still complaining of back pain and left leg pain. Pain is 10 out of 10. Continue Dilaudid PCA. Continue bolus doses. No Toradol due to allergies. Continue IV fluids and close monitoring.  #2 sickle cell anemia: Continue with H&H monitoring. Currently completed.  #3 adjustment disorder: He has oppositional defiant disorder. Continue to treat with home regimen  #4 chronic pain syndrome: Continue chronic home medications.  #5 hypokalemia: Continue to replete  LOS: 3 days   Brock Larmon,LAWAL 02/11/2019, 6:44 PM

## 2019-02-12 MED ORDER — OXYCODONE HCL 5 MG PO TABS
20.0000 mg | ORAL_TABLET | Freq: Once | ORAL | Status: AC
  Administered 2019-02-12: 20 mg via ORAL
  Filled 2019-02-12: qty 4

## 2019-02-12 NOTE — Progress Notes (Signed)
Subjective: Patient is complaining of ongoing pain. He has lost his IV site. He is still getting the Dilaudid PCA. No fever or chills. Patient is still having some nausea and abdominal pain with distention. T-max is 101.1.  Objective: Vital signs in last 24 hours: Temp:  [100.2 F (37.9 C)-101.1 F (38.4 C)] 101.1 F (38.4 C) (12/29 1731) Pulse Rate:  [104-107] 107 (12/29 1731) Resp:  [14-20] 18 (12/29 2106) BP: (120-135)/(69-74) 128/73 (12/29 1731) SpO2:  [93 %-100 %] 94 % (12/29 2106) FiO2 (%):  [41 %] 41 % (12/29 0128) Weight change:  Last BM Date: 02/08/19  Intake/Output from previous day: 12/28 0701 - 12/29 0700 In: 6360.6 [P.O.:480; I.V.:5880.6] Out: 1100 [Urine:1100] Intake/Output this shift: No intake/output data recorded.  General appearance: alert, cooperative, appears stated age and no distress Neck: no adenopathy, no carotid bruit, no JVD, supple, symmetrical, trachea midline and thyroid not enlarged, symmetric, no tenderness/mass/nodules Back: symmetric, no curvature. ROM normal. No CVA tenderness. Resp: clear to auscultation bilaterally Cardio: regular rate and rhythm, S1, S2 normal, no murmur, click, rub or gallop GI: soft, non-tender; bowel sounds normal; no masses,  no organomegaly Extremities: extremities normal, atraumatic, no cyanosis or edema Neurologic: Grossly normal  Lab Results: Recent Labs    02/10/19 0940 02/10/19 1637  WBC 19.7* 17.8*  HGB 9.0* 8.6*  HCT 26.6* 25.5*  PLT 346 345   BMET Recent Labs    02/10/19 0940  NA 134*  K 3.6  CL 98  CO2 22  GLUCOSE 105*  BUN 11  CREATININE 0.54*  CALCIUM 9.0    Studies/Results: No results found.  Medications: I have reviewed the patient's current medications.  Assessment/Plan: A 28 year old with sickle cell painful crisis  #1 sickle cell painful crisis: Patient still complaining of back pain and left leg pain. Pain is 9 out of 10. Will continue Dilaudid PCA and supportive care. Continue  bolus doses. No Toradol due to allergies. Continue IV fluids and close monitoring. We will consider imaging his low back if the pain persists but at present this is from his sickle cell crisis instead.  #2 sickle cell anemia: Continue with H&H monitoring. Currently completed.  #3 adjustment disorder: He has oppositional defiant disorder. Continue to treat with home regimen  #4 chronic pain syndrome: Continue chronic home medications.  #5 hypokalemia: Continue to replete  LOS: 4 days   Larita Deremer,LAWAL 02/12/2019, 9:08 PM

## 2019-02-13 DIAGNOSIS — R6510 Systemic inflammatory response syndrome (SIRS) of non-infectious origin without acute organ dysfunction: Secondary | ICD-10-CM

## 2019-02-13 LAB — BASIC METABOLIC PANEL
Anion gap: 12 (ref 5–15)
BUN: 7 mg/dL (ref 6–20)
CO2: 26 mmol/L (ref 22–32)
Calcium: 8.6 mg/dL — ABNORMAL LOW (ref 8.9–10.3)
Chloride: 96 mmol/L — ABNORMAL LOW (ref 98–111)
Creatinine, Ser: 0.52 mg/dL — ABNORMAL LOW (ref 0.61–1.24)
GFR calc Af Amer: >60 mL/min (ref >60)
GFR calc non Af Amer: >60 mL/min (ref >60)
Glucose, Bld: 110 mg/dL — ABNORMAL HIGH (ref 70–99)
Potassium: 3 mmol/L — ABNORMAL LOW (ref 3.5–5.1)
Sodium: 134 mmol/L — ABNORMAL LOW (ref 135–145)

## 2019-02-13 LAB — CBC
HCT: 19.4 % — ABNORMAL LOW (ref 39.0–52.0)
Hemoglobin: 6.6 g/dL — CL (ref 13.0–17.0)
MCH: 28.9 pg (ref 26.0–34.0)
MCHC: 34 g/dL (ref 30.0–36.0)
MCV: 85.1 fL (ref 80.0–100.0)
Platelets: 462 10*3/uL — ABNORMAL HIGH (ref 150–400)
RBC: 2.28 MIL/uL — ABNORMAL LOW (ref 4.22–5.81)
RDW: 19.6 % — ABNORMAL HIGH (ref 11.5–15.5)
WBC: 15.6 10*3/uL — ABNORMAL HIGH (ref 4.0–10.5)
nRBC: 2.6 % — ABNORMAL HIGH (ref 0.0–0.2)

## 2019-02-13 LAB — PREPARE RBC (CROSSMATCH)

## 2019-02-13 MED ORDER — OXYCODONE HCL 5 MG PO TABS
10.0000 mg | ORAL_TABLET | ORAL | Status: DC | PRN
  Administered 2019-02-14 – 2019-02-15 (×3): 10 mg via ORAL
  Filled 2019-02-13 (×3): qty 2

## 2019-02-13 MED ORDER — DIPHENHYDRAMINE HCL 25 MG PO CAPS
25.0000 mg | ORAL_CAPSULE | Freq: Once | ORAL | Status: AC
  Administered 2019-02-13: 25 mg via ORAL
  Filled 2019-02-13: qty 1

## 2019-02-13 MED ORDER — HYDROMORPHONE 1 MG/ML IV SOLN
INTRAVENOUS | Status: DC
  Administered 2019-02-13: 6.5 mg via INTRAVENOUS
  Administered 2019-02-14: 0.5 mg via INTRAVENOUS
  Administered 2019-02-14: 5.5 mg via INTRAVENOUS
  Administered 2019-02-14: 30 mg via INTRAVENOUS
  Administered 2019-02-14: 5.5 mg via INTRAVENOUS
  Administered 2019-02-14: 2.5 mg via INTRAVENOUS
  Administered 2019-02-14: 6.5 mg via INTRAVENOUS
  Administered 2019-02-15: 5 mg via INTRAVENOUS
  Administered 2019-02-15: 1.5 mg via INTRAVENOUS
  Administered 2019-02-15: 5 mg via INTRAVENOUS
  Administered 2019-02-15: 4 mg via INTRAVENOUS
  Filled 2019-02-13: qty 30

## 2019-02-13 MED ORDER — ACETAMINOPHEN 325 MG PO TABS
650.0000 mg | ORAL_TABLET | Freq: Once | ORAL | Status: AC
  Administered 2019-02-13: 650 mg via ORAL
  Filled 2019-02-13: qty 2

## 2019-02-13 MED ORDER — SODIUM CHLORIDE 0.9% IV SOLUTION: Freq: Once | INTRAVENOUS | Status: DC

## 2019-02-13 NOTE — Progress Notes (Signed)
Subjective: Timothy Marks, a 28 year old male with a medical history significant for sickle cell disease, chronic pain syndrome, opiate dependence and tolerance, oppositional defiance disorder, and anemia of chronic disease was admitted in sickle cell pain crisis.  Patient says that pain is improving. Pain is primarily to lower back and is characterized as constant and throbbing. Pain intensity is 7/10.   Hemoglobin is 6.6, which is below patient's baseline of 8.0-9.0. Patient denies headache, dizziness, shortness of breath, heart palpitations, urinary symptoms, nausea, vomiting, or diarrhea.    Objective:  Vital signs in last 24 hours:  Vitals:   02/13/19 1803 02/13/19 1805 02/13/19 2040 02/13/19 2119  BP:   139/85 129/70  Pulse:   (!) 109 (!) 108  Resp: 14 14  16   Temp:   (!) 100.4 F (38 C) 98.4 F (36.9 C)  TempSrc:   Oral   SpO2: 94% 94% 100%   Height:        Intake/Output from previous day:   Intake/Output Summary (Last 24 hours) at 02/13/2019 2157 Last data filed at 02/13/2019 1700 Gross per 24 hour  Intake 2218.25 ml  Output 1500 ml  Net 718.25 ml    Physical Exam: General: Alert, awake, oriented x3, in no acute distress.  HEENT: [place]/AT PEERL, EOMI Neck: Trachea midline,  no masses, no thyromegal,y no JVD, no carotid bruit OROPHARYNX:  Moist, No exudate/ erythema/lesions.  Heart: Regular rate and rhythm, without murmurs, rubs, gallops, PMI non-displaced, no heaves or thrills on palpation.  Lungs: Clear to auscultation, no wheezing or rhonchi noted. No increased vocal fremitus resonant to percussion  Abdomen: Soft, nontender, nondistended, positive bowel sounds, no masses no hepatosplenomegaly noted..  Neuro: No focal neurological deficits noted cranial nerves II through XII grossly intact. DTRs 2+ bilaterally upper and lower extremities. Strength 5 out of 5 in bilateral upper and lower extremities. Musculoskeletal: No warm swelling or erythema around joints, no  spinal tenderness noted. Psychiatric: Patient alert and oriented x3, good insight and cognition, good recent to remote recall. Lymph node survey: No cervical axillary or inguinal lymphadenopathy noted.  Lab Results:  Basic Metabolic Panel:    Component Value Date/Time   NA 134 (L) 02/13/2019 0825   NA 139 08/31/2018 0854   K 3.0 (L) 02/13/2019 0825   CL 96 (L) 02/13/2019 0825   CO2 26 02/13/2019 0825   BUN 7 02/13/2019 0825   BUN 6 08/31/2018 0854   CREATININE 0.52 (L) 02/13/2019 0825   GLUCOSE 110 (H) 02/13/2019 0825   CALCIUM 8.6 (L) 02/13/2019 0825   CBC:    Component Value Date/Time   WBC 15.6 (H) 02/13/2019 0825   HGB 6.6 (LL) 02/13/2019 0825   HGB 8.0 (L) 11/13/2018 1408   HCT 19.4 (L) 02/13/2019 0825   HCT 25.1 (L) 11/13/2018 1408   PLT 462 (H) 02/13/2019 0825   PLT 792 (H) 11/13/2018 1408   MCV 85.1 02/13/2019 0825   MCV 95 11/13/2018 1408   NEUTROABS 14.7 (H) 02/10/2019 1637   NEUTROABS 5.5 11/13/2018 1408   LYMPHSABS 1.1 02/10/2019 1637   LYMPHSABS 2.9 11/13/2018 1408   MONOABS 1.8 (H) 02/10/2019 1637   EOSABS 0.0 02/10/2019 1637   EOSABS 0.0 11/13/2018 1408   BASOSABS 0.0 02/10/2019 1637   BASOSABS 0.0 11/13/2018 1408    Recent Results (from the past 240 hour(s))  SARS CORONAVIRUS 2 (TAT 6-24 HRS) Nasopharyngeal Nasopharyngeal Swab     Status: None   Collection Time: 02/08/19  7:36 PM  Specimen: Nasopharyngeal Swab  Result Value Ref Range Status   SARS Coronavirus 2 NEGATIVE NEGATIVE Final    Comment: (NOTE) SARS-CoV-2 target nucleic acids are NOT DETECTED. The SARS-CoV-2 RNA is generally detectable in upper and lower respiratory specimens during the acute phase of infection. Negative results do not preclude SARS-CoV-2 infection, do not rule out co-infections with other pathogens, and should not be used as the sole basis for treatment or other patient management decisions. Negative results must be combined with clinical observations, patient  history, and epidemiological information. The expected result is Negative. Fact Sheet for Patients: SugarRoll.be Fact Sheet for Healthcare Providers: https://www.woods-mathews.com/ This test is not yet approved or cleared by the [place] FDA and  has been authorized for detection and/or diagnosis of SARS-CoV-2 by FDA under an Emergency Use Authorization (EUA). This EUA will remain  in effect (meaning this test can be used) for the duration of the COVID-19 declaration under Section 56 4(b)(1) of the Act, 21 U.S.C. section 360bbb-3(b)(1), unless the authorization is terminated or revoked sooner. Performed at [facility], [place] [address] [place], [place] [address]   Culture, blood (Routine X 2) w Reflex to ID Panel     Status: None (Preliminary result)   Collection Time: 02/10/19  4:37 PM   Specimen: BLOOD  Result Value Ref Range Status   Specimen Description   Final    BLOOD RIGHT ANTECUBITAL Performed at [facility] [place] [address], [place], [place] [address]    Special Requests   Final    BOTTLES DRAWN AEROBIC ONLY Blood Culture adequate volume Performed at [place] [address] [place], [place] [address]    Culture   Final    NO GROWTH 3 DAYS Performed at [facility], [place] [address], [place], [place] [address]    Report Status PENDING  Incomplete  Culture, blood (Routine X 2) w Reflex to ID Panel     Status: None (Preliminary result)   Collection Time: 02/10/19  7:28 PM   Specimen: BLOOD RIGHT HAND  Result Value Ref Range Status   Specimen Description   Final    BLOOD RIGHT HAND Performed at [facility] [address], [place], [place] [address]    Special Requests   Final    BOTTLES DRAWN AEROBIC ONLY Blood Culture adequate volume Performed at [facility] [address], [place], [place] [address]    Culture   Final    NO  GROWTH 2 DAYS Performed at [facility] [address] [place], [place] [address]    Report Status PENDING  Incomplete    Studies/Results: No results found.  Medications: Scheduled Meds: . sodium chloride   Intravenous Once  . cholecalciferol  2,000 Units Oral Daily  . enoxaparin (LOVENOX) injection  40 mg Subcutaneous Q24H  . feeding supplement (ENSURE ENLIVE)  237 mL Oral TID BM  . folic acid  1 mg Oral Daily  . gabapentin  300 mg Oral TID  . HYDROmorphone   Intravenous Q4H  . hydroxyurea  500 mg Oral BID  . oxyCODONE  20 mg Oral Q12H  . senna-docusate  1 tablet Oral BID   Continuous Infusions: . sodium chloride 20 mL/hr at 02/13/19 1652  . diphenhydrAMINE     PRN Meds:.acetaminophen, diphenhydrAMINE **OR** diphenhydrAMINE, haloperidol, naloxone **AND** sodium chloride flush, ondansetron (ZOFRAN) IV, polyethylene glycol  Consultants:  None  Procedures:  None  Antibiotics:  None  Assessment/Plan: Principal Problem:   Sickle cell crisis (HCC) Active Problems:   Hypokalemia   Adjustment disorder with mixed disturbance of emotions and conduct   Chronic pain syndrome   SIRS (systemic inflammatory response syndrome) (HCC)  Sickle cell disease with pain crisis: Continue IV fluids at [facility] Oxycodone 10 mg  Weaning PCA Dilaudid Supplemental oxygen as needed  Leukocytosis:  Improving. Patient afebrile. No signs of infection or inflammation. Continue to monitor closely  Chronic pain syndrome:  Stable . Continue home medications  Sickle cell anemia:  Hemoglobin 6.6. Baseline hemoglobin is 8.0-9.0. Transfuse 1 unit of packed red blood cells. Follow CBC in am  Adjustment disorder with mixed disturbance of emotion and conduct:  Stable. Continue Haldol 2 mg by mouth every 6 hours as needed for increased agitation.    Code Status: Full Code Family Communication: N/A Disposition Plan: Not yet ready for discharge  [place], MSN,  FNP-C Patient [place] [address] [place], [place] [address] [phone]  If 5PM-7AM, please contact night-coverage.  02/13/2019, 9:57 PM  LOS: 5 days    This note was prepared using Dragon speech recognition software, errors in dictation are unintentional.

## 2019-02-13 NOTE — Progress Notes (Signed)
CRITICAL VALUE ALERT  Critical Value:  Hemoglobin 6.6  Date & Time Notied:  02/13/19 at [place]  Provider Notified: [place] Hollis, NP  Orders Received/Actions taken: awaiting orders

## 2019-02-14 LAB — BPAM RBC
Blood Product Expiration Date: 202101222359
ISSUE DATE / TIME: 202012302038
Unit Type and Rh: 9500

## 2019-02-14 LAB — TYPE AND SCREEN
ABO/RH(D): A POS
Antibody Screen: NEGATIVE
Donor AG Type: NEGATIVE
Unit division: 0

## 2019-02-14 LAB — HEMOGLOBIN AND HEMATOCRIT, BLOOD
HCT: 23.3 % — ABNORMAL LOW (ref 39.0–52.0)
Hemoglobin: 7.8 g/dL — ABNORMAL LOW (ref 13.0–17.0)

## 2019-02-14 MED ORDER — LORAZEPAM 2 MG/ML IJ SOLN
1.0000 mg | Freq: Once | INTRAMUSCULAR | Status: AC
  Administered 2019-02-14: 1 mg via INTRAVENOUS
  Filled 2019-02-14: qty 1

## 2019-02-14 NOTE — Progress Notes (Signed)
Subjective: Timothy Marks, a 28 year old male with a medical history significant for sickle cell disease type SS, chronic pain syndrome, opiate dependence and tolerance, history of anemia of chronic disease, and history of oppositional defiance disorder was admitted in sickle cell pain crisis. Today, patient is writhing in pain and crying on arrival.  He reports new pain to left shoulder that is radiating to forearm.  Pain intensity is 10/10.  He characterizes pain as throbbing.  Patient says that he is unable to get comfortable. Today, patient's hemoglobin is 7.4.  He is s/p 1 unit of PRBCs on 02/13/2019.  Baseline hemoglobin is 8.0-9.0 g/dL. Patient denies headache, chest pain, shortness of breath, paresthesias, urinary symptoms, nausea, vomiting, or diarrhea.  Objective:  Vital signs in last 24 hours:  Vitals:   02/14/19 0734 02/14/19 0746 02/14/19 1151 02/14/19 1325  BP:  119/74  (!) 145/80  Pulse:  92  98  Resp: 17 14 19 18   Temp:  98.8 F (37.1 C)  98.1 F (36.7 C)  TempSrc:  Oral  Oral  SpO2: 98% 97% 100% 97%  Height:        Intake/Output from previous day:   Intake/Output Summary (Last 24 hours) at 02/14/2019 1415 Last data filed at 02/14/2019 0825 Gross per 24 hour  Intake 1471.31 ml  Output 1100 ml  Net 371.31 ml    Physical Exam: General: Alert, awake, oriented x3, in no acute distress.  HEENT: [place]/AT PEERL, EOMI Neck: Trachea midline,  no masses, no thyromegal,y no JVD, no carotid bruit OROPHARYNX:  Moist, No exudate/ erythema/lesions.  Heart: Regular rate and rhythm, without murmurs, rubs, gallops, PMI non-displaced, no heaves or thrills on palpation.  Lungs: Clear to auscultation, no wheezing or rhonchi noted. No increased vocal fremitus resonant to percussion  Abdomen: Soft, nontender, nondistended, positive bowel sounds, no masses no hepatosplenomegaly noted..  Neuro: No focal neurological deficits noted cranial nerves II through XII grossly intact. DTRs 2+  bilaterally upper and lower extremities. Strength 5 out of 5 in bilateral upper and lower extremities. Musculoskeletal: No warm swelling or erythema around joints, no spinal tenderness noted. Psychiatric: Patient alert and oriented x3, good insight and cognition, good recent to remote recall. Lymph node survey: No cervical axillary or inguinal lymphadenopathy noted.  Lab Results:  Basic Metabolic Panel:    Component Value Date/Time   NA 134 (L) 02/13/2019 0825   NA 139 08/31/2018 0854   K 3.0 (L) 02/13/2019 0825   CL 96 (L) 02/13/2019 0825   CO2 26 02/13/2019 0825   BUN 7 02/13/2019 0825   BUN 6 08/31/2018 0854   CREATININE 0.52 (L) 02/13/2019 0825   GLUCOSE 110 (H) 02/13/2019 0825   CALCIUM 8.6 (L) 02/13/2019 0825   CBC:    Component Value Date/Time   WBC 15.6 (H) 02/13/2019 0825   HGB 7.8 (L) 02/14/2019 0549   HGB 8.0 (L) 11/13/2018 1408   HCT 23.3 (L) 02/14/2019 0549   HCT 25.1 (L) 11/13/2018 1408   PLT 462 (H) 02/13/2019 0825   PLT 792 (H) 11/13/2018 1408   MCV 85.1 02/13/2019 0825   MCV 95 11/13/2018 1408   NEUTROABS 14.7 (H) 02/10/2019 1637   NEUTROABS 5.5 11/13/2018 1408   LYMPHSABS 1.1 02/10/2019 1637   LYMPHSABS 2.9 11/13/2018 1408   MONOABS 1.8 (H) 02/10/2019 1637   EOSABS 0.0 02/10/2019 1637   EOSABS 0.0 11/13/2018 1408   BASOSABS 0.0 02/10/2019 1637   BASOSABS 0.0 11/13/2018 1408    Recent Results (from the  past 240 hour(s))  SARS CORONAVIRUS 2 (TAT 6-24 HRS) Nasopharyngeal Nasopharyngeal Swab     Status: None   Collection Time: 02/08/19  7:36 PM   Specimen: Nasopharyngeal Swab  Result Value Ref Range Status   SARS Coronavirus 2 NEGATIVE NEGATIVE Final    Comment: (NOTE) SARS-CoV-2 target nucleic acids are NOT DETECTED. The SARS-CoV-2 RNA is generally detectable in upper and lower respiratory specimens during the acute phase of infection. Negative results do not preclude SARS-CoV-2 infection, do not rule out co-infections with other pathogens, and  should not be used as the sole basis for treatment or other patient management decisions. Negative results must be combined with clinical observations, patient history, and epidemiological information. The expected result is Negative. Fact Sheet for Patients: SugarRoll.be Fact Sheet for Healthcare Providers: https://www.woods-mathews.com/ This test is not yet approved or cleared by the [place] FDA and  has been authorized for detection and/or diagnosis of SARS-CoV-2 by FDA under an Emergency Use Authorization (EUA). This EUA will remain  in effect (meaning this test can be used) for the duration of the COVID-19 declaration under Section 56 4(b)(1) of the Act, 21 U.S.C. section 360bbb-3(b)(1), unless the authorization is terminated or revoked sooner. Performed at [facility], [place] [address], [place], [place] [address]   Culture, blood (Routine X 2) w Reflex to ID Panel     Status: None (Preliminary result)   Collection Time: 02/10/19  4:37 PM   Specimen: BLOOD  Result Value Ref Range Status   Specimen Description   Final    BLOOD RIGHT ANTECUBITAL Performed at [facility] [address] [place], [place] [address]    Special Requests   Final    BOTTLES DRAWN AEROBIC ONLY Blood Culture adequate volume Performed at [facility] [address], [place], [place] [address]    Culture   Final    NO GROWTH 4 DAYS Performed at [facility], [place] [address] [place], [place] [address]    Report Status PENDING  Incomplete  Culture, blood (Routine X 2) w Reflex to ID Panel     Status: None (Preliminary result)   Collection Time: 02/10/19  7:28 PM   Specimen: BLOOD RIGHT HAND  Result Value Ref Range Status   Specimen Description   Final    BLOOD RIGHT HAND Performed at [facility] [address], [place], [place] [address]    Special Requests   Final    BOTTLES DRAWN  AEROBIC ONLY Blood Culture adequate volume Performed at [facility] [address], [place], [place] [address]    Culture   Final    NO GROWTH 3 DAYS Performed at [facility], [place] [address], [place],  [address]    Report Status PENDING  Incomplete    Studies/Results: No results found.  Medications: Scheduled Meds: . sodium chloride   Intravenous Once  . cholecalciferol  2,000 Units Oral Daily  . enoxaparin (LOVENOX) injection  40 mg Subcutaneous Q24H  . feeding supplement (ENSURE ENLIVE)  237 mL Oral TID BM  . folic acid  1 mg Oral Daily  . gabapentin  300 mg Oral TID  . HYDROmorphone   Intravenous Q4H  . hydroxyurea  500 mg Oral BID  . oxyCODONE  20 mg Oral Q12H  . senna-docusate  1 tablet Oral BID   Continuous Infusions: . diphenhydrAMINE     PRN Meds:.acetaminophen, diphenhydrAMINE **OR** diphenhydrAMINE, haloperidol, naloxone **AND** sodium  chloride flush, ondansetron (ZOFRAN) IV, oxyCODONE, polyethylene glycol  Consultants:  None  Procedures:  None  Antibiotics:  None  Assessment/Plan: Principal Problem:   Sickle cell crisis (HCC) Active Problems:   Hypokalemia   Adjustment disorder with mixed disturbance of emotions and conduct   Chronic pain syndrome   SIRS (systemic inflammatory response syndrome) (HCC)  Sickle cell disease with pain crisis:  Continue IV fluids at [facility].  IV Dilaudid via PCA, no changes in settings on today.  Patient is complaining of increased pain in the shoulder. Continue oxycodone 10 mg every 4 hours as needed for severe pain. Supplemental oxygen as needed.  Leukocytosis: Stable.  No signs of infection or inflammation.  Continue to monitor closely.  Chronic pain syndrome: Stable.  Continue home patients.  Sickle cell anemia:  Hemoglobin improved to 7.4.  Baseline is 8.0-9.0 g/dL.  Patient is s/p 1 unit of cells.  No further transfusion warranted at this time.  Repeat CBC in  a.m.  Adjustment disorder with mixed disturbance of emotion and conduct: Stable.  Continue Haldol 2 mg 6 hours as needed for increased agitation.    Code Status: Full Code Family Communication: N/A Disposition Plan: Not yet ready for discharge  [place], MSN, FNP-C [facility] [address] [place], [place] [address] [phone]  If 5PM-7AM, please contact night-coverage.  02/14/2019, 2:15 PM  LOS: 6 days

## 2019-02-15 LAB — CULTURE, BLOOD (ROUTINE X 2)
Culture: NO GROWTH
Special Requests: ADEQUATE

## 2019-02-15 LAB — BASIC METABOLIC PANEL
Anion gap: 12 (ref 5–15)
BUN: 12 mg/dL (ref 6–20)
CO2: 28 mmol/L (ref 22–32)
Calcium: 9.4 mg/dL (ref 8.9–10.3)
Chloride: 95 mmol/L — ABNORMAL LOW (ref 98–111)
Creatinine, Ser: 0.6 mg/dL — ABNORMAL LOW (ref 0.61–1.24)
GFR calc Af Amer: >60 mL/min (ref >60)
GFR calc non Af Amer: >60 mL/min (ref >60)
Glucose, Bld: 113 mg/dL — ABNORMAL HIGH (ref 70–99)
Potassium: 4 mmol/L (ref 3.5–5.1)
Sodium: 135 mmol/L (ref 135–145)

## 2019-02-15 LAB — CBC
HCT: 24.8 % — ABNORMAL LOW (ref 39.0–52.0)
Hemoglobin: 8.3 g/dL — ABNORMAL LOW (ref 13.0–17.0)
MCH: 28.6 pg (ref 26.0–34.0)
MCHC: 33.5 g/dL (ref 30.0–36.0)
MCV: 85.5 fL (ref 80.0–100.0)
Platelets: 620 10*3/uL — ABNORMAL HIGH (ref 150–400)
RBC: 2.9 MIL/uL — ABNORMAL LOW (ref 4.22–5.81)
RDW: 20 % — ABNORMAL HIGH (ref 11.5–15.5)
WBC: 12.5 10*3/uL — ABNORMAL HIGH (ref 4.0–10.5)
nRBC: 9.5 % — ABNORMAL HIGH (ref 0.0–0.2)

## 2019-02-15 NOTE — Discharge Instructions (Signed)
Sickle Cell Anemia, Adult  Sickle cell anemia is a condition where your red blood cells are shaped like sickles. Red blood cells carry oxygen through the body. Sickle-shaped cells do not live as long as normal red blood cells. They also clump together and block blood from flowing through the blood vessels. This prevents the body from getting enough oxygen. Sickle cell anemia causes organ damage and pain. It also increases the risk of infection. Follow these instructions at home: Medicines  Take over-the-counter and prescription medicines only as told by your doctor.  If you were prescribed an antibiotic medicine, take it as told by your doctor. Do not stop taking the antibiotic even if you start to feel better.  If you develop a fever, do not take medicines to lower the fever right away. Tell your doctor about the fever. Managing pain, stiffness, and swelling  Try these methods to help with pain: ? Use a heating pad. ? Take a warm bath. ? Distract yourself, such as by watching TV. Eating and drinking  Drink enough fluid to keep your pee (urine) clear or pale yellow. Drink more in hot weather and during exercise.  Limit or avoid alcohol.  Eat a healthy diet. Eat plenty of fruits, vegetables, whole grains, and lean protein.  Take vitamins and supplements as told by your doctor. Traveling  When traveling, keep these with you: ? Your medical information. ? The names of your doctors. ? Your medicines.  If you need to take an airplane, talk to your doctor first. Activity  Rest often.  Avoid exercises that make your heart beat much faster, such as jogging. General instructions  Do not use products that have nicotine or tobacco, such as cigarettes and e-cigarettes. If you need help quitting, ask your doctor.  Consider wearing a medical alert bracelet.  Avoid being in high places (high altitudes), such as mountains.  Avoid very hot or cold temperatures.  Avoid places where the  temperature changes a lot.  Keep all follow-up visits as told by your doctor. This is important. Contact a doctor if:  A joint hurts.  Your feet or hands hurt or swell.  You feel tired (fatigued). Get help right away if:  You have symptoms of infection. These include: ? Fever. ? Chills. ? Being very tired. ? Irritability. ? Poor eating. ? Throwing up (vomiting).  You feel dizzy or faint.  You have new stomach pain, especially on the left side.  You have a an erection (priapism) that lasts more than 4 hours.  You have numbness in your arms or legs.  You have a hard time moving your arms or legs.  You have trouble talking.  You have pain that does not go away when you take medicine.  You are short of breath.  You are breathing fast.  You have a long-term cough.  You have pain in your chest.  You have a bad headache.  You have a stiff neck.  Your stomach looks bloated even though you did not eat much.  Your skin is pale.  You suddenly cannot see well. Summary  Sickle cell anemia is a condition where your red blood cells are shaped like sickles.  Follow your doctor's advice on ways to manage pain, food to eat, activities to do, and steps to take for safe travel.  Get medical help right away if you have any signs of infection, such as a fever. This information is not intended to replace advice given to you by   your health care provider. Make sure you discuss any questions you have with your health care provider. Document Revised: 05/25/2018 Document Reviewed: 03/08/2016 Elsevier Patient Education  2020 Elsevier Inc.  

## 2019-02-15 NOTE — Discharge Summary (Signed)
Physician Discharge Summary  Zyron Deeley UGQ:[id number] DOB: [birth year] DOA: 02/08/2019  PCP: Tresa Garter, MD  Admit date: 02/08/2019  Discharge date: 02/15/2019  Discharge Diagnoses:  Principal Problem:   Sickle cell crisis ([place]) Active Problems:   Hypokalemia   Adjustment disorder with mixed disturbance of emotions and conduct   Chronic pain syndrome   SIRS (systemic inflammatory response syndrome) ([place])   Discharge Condition: Stable  Disposition:  Follow-up Information    Dorena Dew, FNP Follow up in 2 day(s).   Specialty: Family Medicine Why: Repeat CBC with differential, BMP, and UDS Contact information: [address] [address] [phone]          Pt is discharged home in good condition and is to follow up with Cammie Sickle in 2 weeks to have labs evaluated. Dylen Mcelhannon is instructed to increase activity slowly and balance with rest for the next few days, and use prescribed medication to complete treatment of pain  Diet: Regular Wt Readings from Last 3 Encounters:  01/15/19 55.8 kg  12/26/18 61.2 kg  12/21/18 61.2 kg    History of present illness:  [place], a 29-year-old male with a medical history significant enterococcal cell disease type SS, history of osteonecrosis of left shoulder, depression, history of oppositional defiance disorder, history of anemia of chronic disease, chronic pain syndrome, opiate dependence and opiate tolerance presented to ER with complaints of generalized pain.  Patient attributes current pain crisis to cold weather.  Pain is primarily to upper and lower extremities and low back.  Patient says that he did not run out of pain medications prior to admission.  He says that generalized pain is consistent with previous sickle cell crisis.  ER course: Temperature 98.9 F, P 78, RR 20, BP 104/50, oxygen saturation 100% on RA.  WBC 19.6, hemoglobin 9.9, platelets 499.  Sodium 137, potassium 3.4, BUN  6, creatinine 0.63, AST 35, ALT 16, alk phos 67, and total bilirubin 1.9.  Urinalysis pending.  COVID-19 test pending.  Patient admitted to [facility] for sickle cell pain crisis.  Hospital Course:  Sickle cell disease with pain crisis: Patient admitted for sickle cell pain crisis and was managed appropriately with IVF, IV Dilaudid via PCA, and IV Toradol as well as other adjunct therapies per sickle cell pain management protocols.  IV Dilaudid PCA weaned appropriately.  Patient transition to oxycodone 10 mg every 4 hours as needed for severe breakthrough pain.  OxyContin 20 mg every 12 hours was continued without interruption. Sickle cell anemia: Hemoglobin decreased to 6.6, which is below baseline.   Patient was transfused 2 units of packed red blood cells.  Hemoglobin prior to discharge is 8.3.  Leukocytosis: Improved throughout admission.  Was periodically febrile, considered to be systemic inflammatory response syndrome.  Patient been managed without antibiotics.  He is currently afebrile.  WBCs prior to discharge 12.5.  Patient will follow-up in clinic to repeat CBC with differential and BMP in 1 week. Patient has a history of oppositional defiance disorder.  He is easily agitated.  He is not currently under the care of psychiatry.  Patient is managing without antipsychotic medications.  Recommend in clinic follow-up with Estanislado Emms, LCSW and behavioral specialist.  Will call patient to schedule appointment. Pain intensity is 6/10 and patient is requesting discharge. Patient alert, oriented, and ambulating without assistance. Patient will discharge home in a hemodynamically stable condition. Marland Kitchen   Discharge Exam: Vitals:   02/15/19 1015 02/15/19 1145  BP:  119/73   Pulse: 81   Resp: 14 16  Temp: 99.2 F (37.3 C)   SpO2: 99% 98%   Vitals:   02/15/19 0618 02/15/19 0815 02/15/19 1015 02/15/19 1145  BP:   119/73   Pulse:   81   Resp: 17 18 14 16   Temp:   99.2 F (37.3 C)   TempSrc:    Oral   SpO2: 100% 95% 99% 98%  Height:        General appearance : Awake, alert, not in any distress. Speech Clear. Not toxic looking HEENT: Atraumatic and Normocephalic, pupils equally reactive to light and accomodation Neck: Supple, no JVD. No cervical lymphadenopathy.  Chest: Good air entry bilaterally, no added sounds  CVS: S1 S2 regular, no murmurs.  Abdomen: Bowel sounds present, Non tender and not distended with no gaurding, rigidity or rebound. Extremities: B/L Lower Ext shows no edema, both legs are warm to touch Neurology: Awake alert, and oriented X 3, CN II-XII intact, Non focal Skin: No Rash  Discharge Instructions  Discharge Instructions    Discharge patient   Complete by: As directed    Discharge disposition: 01-Home or Self Care   Discharge patient date: 02/15/2019     Allergies as of 02/15/2019      Reactions   Buprenorphine Hcl Hives   Levaquin [levofloxacin] Itching   Meperidine Rash   Morphine Hives   Morphine And Related Hives   Toradol [ketorolac Tromethamine] Itching   Tramadol Hives   Vancomycin Itching   Zosyn [piperacillin Sod-tazobactam So] Itching, Rash   Has taken rocephin in past      Medication List    TAKE these medications   cholecalciferol 25 MCG (1000 UT) tablet Commonly known as: VITAMIN D Take 2 tablets (2,000 Units total) by mouth daily.   Ensure Active High Protein Liqd Take 1 each by mouth 3 (three) times daily between meals. #90 cans of Ensure   folic acid 1 MG tablet Commonly known as: FOLVITE Take 1 tablet (1 mg total) by mouth daily.   gabapentin 300 MG capsule Commonly known as: NEURONTIN TAKE 1 CAPSULE(300 MG) BY MOUTH THREE TIMES DAILY What changed: See the new instructions.   HYDROcodone-acetaminophen 5-325 MG tablet Commonly known as: NORCO/VICODIN Take 1 tablet by mouth every 6 (six) hours as needed for moderate pain or severe pain.   hydroxyurea 500 MG capsule Commonly known as: HYDREA Take 1 capsule (500 mg  total) by mouth 2 (two) times daily. May take with food to minimize GI side effects.   ibuprofen 800 MG tablet Commonly known as: ADVIL Take 1 tablet (800 mg total) by mouth 3 (three) times daily as needed (pain). What changed: reasons to take this   oxyCODONE 20 mg 12 hr tablet Commonly known as: OXYCONTIN Take 1 tablet (20 mg total) by mouth every 12 (twelve) hours.   oxyCODONE-acetaminophen 10-325 MG tablet Commonly known as: PERCOCET Take 1 tablet by mouth every 6 (six) hours as needed for pain.       The results of significant diagnostics from this hospitalization (including imaging, microbiology, ancillary and laboratory) are listed below for reference.    Significant Diagnostic Studies: DG Chest Port 1 View  Result Date: 02/10/2019 CLINICAL DATA:  Sickle cell crisis. Fever. EXAM: PORTABLE CHEST 1 VIEW COMPARISON:  02/09/2019 and CT of the chest dated 10/12/2016 FINDINGS: Heart size and pulmonary vascularity are normal. Chronic slight prominence of the main pulmonary artery. Lungs are clear. Chronic sclerosis of the bones consistent  with the patient's history of sickle cell disease. IMPRESSION: No acute abnormalities. Electronically Signed   By: Lorriane Shire M.D.   On: 02/10/2019 18:08   DG CHEST PORT 1 VIEW  Result Date: 02/09/2019 CLINICAL DATA:  Sickle cell crisis. EXAM: PORTABLE CHEST 1 VIEW COMPARISON:  11/04/2018 FINDINGS: The heart size and mediastinal contours are within normal limits. Both lungs are clear. The visualized skeletal structures are unremarkable. IMPRESSION: No active disease. Electronically Signed   By: Marin Olp M.D.   On: 02/09/2019 07:41    Microbiology: Recent Results (from the past 240 hour(s))  SARS CORONAVIRUS 2 (TAT 6-24 HRS) Nasopharyngeal Nasopharyngeal Swab     Status: None   Collection Time: 02/08/19  7:36 PM   Specimen: Nasopharyngeal Swab  Result Value Ref Range Status   SARS Coronavirus 2 NEGATIVE NEGATIVE Final    Comment:  (NOTE) SARS-CoV-2 target nucleic acids are NOT DETECTED. The SARS-CoV-2 RNA is generally detectable in upper and lower respiratory specimens during the acute phase of infection. Negative results do not preclude SARS-CoV-2 infection, do not rule out co-infections with other pathogens, and should not be used as the sole basis for treatment or other patient management decisions. Negative results must be combined with clinical observations, patient history, and epidemiological information. The expected result is Negative. Fact Sheet for Patients: SugarRoll.be Fact Sheet for Healthcare Providers: https://www.woods-mathews.com/ This test is not yet approved or cleared by the [place] FDA and  has been authorized for detection and/or diagnosis of SARS-CoV-2 by FDA under an Emergency Use Authorization (EUA). This EUA will remain  in effect (meaning this test can be used) for the duration of the COVID-19 declaration under Section 56 4(b)(1) of the Act, 21 U.S.C. section 360bbb-3(b)(1), unless the authorization is terminated or revoked sooner. Performed at [facility], [place] [address], [place], [place] [address]   Culture, blood (Routine X 2) w Reflex to ID Panel     Status: None   Collection Time: 02/10/19  4:37 PM   Specimen: BLOOD  Result Value Ref Range Status   Specimen Description   Final    BLOOD RIGHT ANTECUBITAL Performed at [facility] [address], [place], [place] [address]    Special Requests   Final    BOTTLES DRAWN AEROBIC ONLY Blood Culture adequate volume Performed at [facility] [address], [place], [place] [address]    Culture   Final    NO GROWTH 5 DAYS Performed at [facility], [place] [address], [place], [place] [address]    Report Status 02/15/2019 FINAL  Final  Culture, blood (Routine X 2) w Reflex to ID Panel     Status: None (Preliminary result)   Collection  Time: 02/10/19  7:28 PM   Specimen: BLOOD RIGHT HAND  Result Value Ref Range Status   Specimen Description   Final    BLOOD RIGHT HAND Performed at [facility] [address], [place], [place] [address]    Special Requests   Final    BOTTLES DRAWN AEROBIC ONLY Blood Culture adequate volume Performed at [facility] [address], [place], [place] [address]    Culture   Final    NO GROWTH 4 DAYS Performed at [facility], [place] [address], [place], [place] [address]    Report Status PENDING  Incomplete     Labs: Basic Metabolic Panel: Recent Labs  Lab 02/08/19 1511 02/09/19 0753 02/10/19 0940  02/13/19 0825 02/15/19 0854  NA 137 133* 134* 134* 135  K 3.4* 3.6 3.6 3.0* 4.0  CL 105 98 98 96* 95*  CO2 23 25 22 26 28   GLUCOSE 116* 104* 105* 110* 113*  BUN 6 8 11 7 12   CREATININE 0.63 0.56* 0.54* 0.52* 0.60*  CALCIUM 9.5 8.9 9.0 8.6* 9.4   Liver Function Tests: Recent Labs  Lab 02/08/19 1511 02/09/19 0753  AST 35 30  ALT 16 15  ALKPHOS 67 69  BILITOT 1.9* 2.8*  PROT 8.3* 7.7  ALBUMIN 4.7 4.2   No results for input(s): LIPASE, AMYLASE in the last 168 hours. No results for input(s): AMMONIA in the last 168 hours. CBC: Recent Labs  Lab 02/08/19 1511 02/09/19 0753 02/10/19 0940 02/10/19 1637 02/13/19 0825 02/14/19 0549 02/15/19 0854  WBC 19.6* 19.9* 19.7* 17.8* 15.6*  --  12.5*  NEUTROABS 14.2* 15.2*  --  14.7*  --   --   --   HGB 9.9* 9.9* 9.0* 8.6* 6.6* 7.8* 8.3*  HCT 28.9* 29.0* 26.6* 25.5* 19.4* 23.3* 24.8*  MCV 88.9 90.1 88.4 88.9 85.1  --  85.5  PLT 499* 420* 346 345 462*  --  620*   Cardiac Enzymes: No results for input(s): CKTOTAL, CKMB, CKMBINDEX, TROPONINI in the last 168 hours. BNP: Invalid input(s): POCBNP CBG: No results for input(s): GLUCAP in the last 168 hours.  Time coordinating discharge: 50 minutes  Signed:  [name]  APRN, MSN, FNP-C [facility] [address] [place], [place] [address] [phone]  [facility] 02/15/2019, 1:22 PM

## 2019-02-16 LAB — CULTURE, BLOOD (ROUTINE X 2)
Culture: NO GROWTH
Special Requests: ADEQUATE

## 2019-02-18 ENCOUNTER — Other Ambulatory Visit: Payer: Self-pay | Admitting: Family Medicine

## 2019-02-18 ENCOUNTER — Telehealth: Payer: Self-pay | Admitting: Family Medicine

## 2019-02-18 DIAGNOSIS — D571 Sickle-cell disease without crisis: Secondary | ICD-10-CM

## 2019-02-18 MED ORDER — OXYCODONE-ACETAMINOPHEN 10-325 MG PO TABS: 1.0000 | ORAL_TABLET | Freq: Four times a day (QID) | ORAL | 0 refills | Status: DC | PRN

## 2019-02-18 MED ORDER — OXYCODONE HCL ER 20 MG PO T12A: 20.0000 mg | EXTENDED_RELEASE_TABLET | Freq: Two times a day (BID) | ORAL | 0 refills | Status: DC

## 2019-02-18 NOTE — Progress Notes (Signed)
Reviewed PDMP substance reporting system prior to prescribing opiate medications. No inconsistencies noted.   Appointment with RN for medication management in 1 week.  Meds ordered this encounter  Medications  . oxyCODONE-acetaminophen (PERCOCET) 10-325 MG tablet    Sig: Take 1 tablet by mouth every 6 (six) hours as needed for up to 15 days for pain.    Dispense:  60 tablet    Refill:  0    Order Specific Question:   Supervising Provider    Answer:   Tresa Garter [id number]  . oxyCODONE (OXYCONTIN) 20 mg 12 hr tablet    Sig: Take 1 tablet (20 mg total) by mouth every 12 (twelve) hours.    Dispense:  60 tablet    Refill:  0    Order Specific Question:   Supervising Provider    Answer:   Tresa Garter [id number]   [name]  APRN, MSN, FNP-C [facility] [address] [place], [place] [address] [phone]

## 2019-02-19 NOTE — Telephone Encounter (Signed)
done

## 2019-02-22 ENCOUNTER — Other Ambulatory Visit: Payer: Self-pay | Admitting: Family Medicine

## 2019-02-22 ENCOUNTER — Telehealth: Payer: Self-pay | Admitting: Family Medicine

## 2019-02-22 DIAGNOSIS — D571 Sickle-cell disease without crisis: Secondary | ICD-10-CM

## 2019-02-22 MED ORDER — OXYCONTIN 20 MG PO T12A: 20.0000 mg | EXTENDED_RELEASE_TABLET | Freq: Two times a day (BID) | ORAL | 0 refills | Status: DC

## 2019-02-22 NOTE — Telephone Encounter (Signed)
I called and spoke with "Ronalee Belts" pharmacist at [facility], he states Oxycodone 12 hour is on backorder and he has none in stock. I also tried [facility], [facility], and [facility] none have Generic Oxycodone 12 hour.   [facility] the Brand name Oxycontin 20mg  12 hour but would need an rx sent there with brand name only marked. Can this be done? Please advise.

## 2019-02-22 NOTE — Progress Notes (Signed)
Reviewed PDMP substance reporting system prior to prescribing opiate medications. No inconsistencies noted.  Meds ordered this encounter  Medications  . OXYCONTIN 20 MG 12 hr tablet    Sig: Take 1 tablet (20 mg total) by mouth every 12 (twelve) hours.    Dispense:  60 tablet    Refill:  0    Order Specific Question:   Supervising Provider    Answer:   Tresa Garter [id number]     [name]  APRN, MSN, FNP-C [facility] [address] [place],  [address] [phone]

## 2019-02-25 ENCOUNTER — Telehealth: Payer: Self-pay | Admitting: Nurse Practitioner

## 2019-02-25 ENCOUNTER — Telehealth: Payer: Self-pay | Admitting: Family Medicine

## 2019-02-25 MED FILL — OxyCONTIN 20 MG T12A: 20 | 30 days supply | Qty: 60 | Fill #0

## 2019-02-25 NOTE — Telephone Encounter (Signed)
Called and spoke with pharmacy Gaspar Bidding) he states mscontin is being filled now. Called and informed patient. Thanks!

## 2019-02-25 NOTE — Telephone Encounter (Signed)
This has been re-submitted. Today 02/24/2018 @10 :32am

## 2019-02-26 ENCOUNTER — Ambulatory Visit: Payer: Medicaid Other

## 2019-02-26 ENCOUNTER — Telehealth: Payer: Self-pay

## 2019-02-26 ENCOUNTER — Other Ambulatory Visit: Payer: Self-pay

## 2019-02-26 DIAGNOSIS — Z79899 Other long term (current) drug therapy: Secondary | ICD-10-CM

## 2019-02-26 NOTE — Telephone Encounter (Signed)
Timothy Marks is here for pill count today.   In his oxycodone/acetaminophen 10/325mg . According to Label they were filled 02/18/2019. There is 2 tablets in bottle. Both have same marking which are "10/325" on side 1 and "M523" on side two. This is consistent with description on pharmacy label.   In Oxycontin 20mg  12 hour: Label states written 02/25/19. There are 44 tablets with "OP" on side 1 and "20" on side 2. The description is consistent with pharmacy label.   Patient states last does of both medications were 02/25/19 in the PM.   Pill count was also verified with Jorja Loa, RMA.

## 2019-02-26 NOTE — Progress Notes (Signed)
Timothy Marks is here for pill count today.   In his oxycodone/acetaminophen 10/325mg . According to Label they were filled 02/18/2019. There is 2 tablets in bottle. Both have same marking which are "10/325" on side 1 and "M523" on side two. This is consistent with description on pharmacy label.   In Oxycontin 20mg  12 hour: Label states written 02/25/19. There are 44 tablets with "OP" on side 1 and "20" on side 2. The description is consistent with pharmacy label.   Patient states last does of both medications were 02/25/19 in the PM.   Pill count was also verified with Jorja Loa, RMA.

## 2019-03-04 ENCOUNTER — Telehealth: Payer: Self-pay | Admitting: Family Medicine

## 2019-03-04 ENCOUNTER — Other Ambulatory Visit: Payer: Self-pay | Admitting: Family Medicine

## 2019-03-04 NOTE — Telephone Encounter (Signed)
Refill request for oxycodone. Please advise. Thanks!  

## 2019-03-06 ENCOUNTER — Encounter (HOSPITAL_COMMUNITY): Payer: Self-pay | Admitting: Emergency Medicine

## 2019-03-06 ENCOUNTER — Emergency Department (HOSPITAL_COMMUNITY)
Admission: EM | Admit: 2019-03-06 | Discharge: 2019-03-06 | Disposition: A | Discharge status: Home / Self Care | Payer: Medicaid Other | Attending: Emergency Medicine | Admitting: Emergency Medicine

## 2019-03-06 ENCOUNTER — Non-Acute Institutional Stay (HOSPITAL_BASED_OUTPATIENT_CLINIC_OR_DEPARTMENT_OTHER)
Admission: AD | Admit: 2019-03-06 | Discharge: 2019-03-06 | Disposition: A | Discharge status: Home / Self Care | Payer: Medicaid Other | Source: Ambulatory Visit | Attending: Internal Medicine | Admitting: Internal Medicine

## 2019-03-06 ENCOUNTER — Encounter (HOSPITAL_COMMUNITY): Payer: Self-pay | Admitting: Internal Medicine

## 2019-03-06 ENCOUNTER — Other Ambulatory Visit: Payer: Self-pay | Admitting: Family Medicine

## 2019-03-06 ENCOUNTER — Telehealth (HOSPITAL_COMMUNITY): Payer: Self-pay | Admitting: General Practice

## 2019-03-06 ENCOUNTER — Other Ambulatory Visit: Payer: Self-pay

## 2019-03-06 DIAGNOSIS — D5700 Hb-SS disease with crisis, unspecified: Secondary | ICD-10-CM

## 2019-03-06 DIAGNOSIS — M791 Myalgia, unspecified site: Secondary | ICD-10-CM | POA: Diagnosis present

## 2019-03-06 DIAGNOSIS — G894 Chronic pain syndrome: Secondary | ICD-10-CM | POA: Diagnosis not present

## 2019-03-06 DIAGNOSIS — Z79899 Other long term (current) drug therapy: Secondary | ICD-10-CM | POA: Insufficient documentation

## 2019-03-06 DIAGNOSIS — D5780 Other sickle-cell disorders without crisis: Secondary | ICD-10-CM | POA: Diagnosis not present

## 2019-03-06 LAB — COMPREHENSIVE METABOLIC PANEL
ALT: 14 U/L (ref 0–44)
AST: 24 U/L (ref 15–41)
Albumin: 4.2 g/dL (ref 3.5–5.0)
Alkaline Phosphatase: 78 U/L (ref 38–126)
Anion gap: 7 (ref 5–15)
BUN: 7 mg/dL (ref 6–20)
CO2: 27 mmol/L (ref 22–32)
Calcium: 9.3 mg/dL (ref 8.9–10.3)
Chloride: 106 mmol/L (ref 98–111)
Creatinine, Ser: 0.5 mg/dL — ABNORMAL LOW (ref 0.61–1.24)
GFR calc Af Amer: >60 mL/min (ref >60)
GFR calc non Af Amer: >60 mL/min (ref >60)
Glucose, Bld: 88 mg/dL (ref 70–99)
Potassium: 4 mmol/L (ref 3.5–5.1)
Sodium: 140 mmol/L (ref 135–145)
Total Bilirubin: 1.1 mg/dL (ref 0.3–1.2)
Total Protein: 7.8 g/dL (ref 6.5–8.1)

## 2019-03-06 LAB — RETICULOCYTES
Immature Retic Fract: 40.6 % — ABNORMAL HIGH (ref 2.3–15.9)
RBC.: 4.21 MIL/uL — ABNORMAL LOW (ref 4.22–5.81)
Retic Count, Absolute: 251.3 10*3/uL — ABNORMAL HIGH (ref 19.0–186.0)
Retic Ct Pct: 6 % — ABNORMAL HIGH (ref 0.4–3.1)

## 2019-03-06 LAB — CBC WITH DIFFERENTIAL/PLATELET
Abs Immature Granulocytes: 0.03 10*3/uL (ref 0.00–0.07)
Basophils Absolute: 0 10*3/uL (ref 0.0–0.1)
Basophils Relative: 0 %
Eosinophils Absolute: 0.1 10*3/uL (ref 0.0–0.5)
Eosinophils Relative: 1 %
HCT: 35.5 % — ABNORMAL LOW (ref 39.0–52.0)
Hemoglobin: 11.8 g/dL — ABNORMAL LOW (ref 13.0–17.0)
Immature Granulocytes: 0 %
Lymphocytes Relative: 26 %
Lymphs Abs: 2 10*3/uL (ref 0.7–4.0)
MCH: 27.8 pg (ref 26.0–34.0)
MCHC: 33.2 g/dL (ref 30.0–36.0)
MCV: 83.5 fL (ref 80.0–100.0)
Monocytes Absolute: 0.7 10*3/uL (ref 0.1–1.0)
Monocytes Relative: 8 %
Neutro Abs: 4.9 10*3/uL (ref 1.7–7.7)
Neutrophils Relative %: 65 %
Platelets: 554 10*3/uL — ABNORMAL HIGH (ref 150–400)
RBC: 4.25 MIL/uL (ref 4.22–5.81)
RDW: 19.7 % — ABNORMAL HIGH (ref 11.5–15.5)
WBC: 7.7 10*3/uL (ref 4.0–10.5)
nRBC: 3.1 % — ABNORMAL HIGH (ref 0.0–0.2)

## 2019-03-06 MED ORDER — KETOROLAC TROMETHAMINE 30 MG/ML IJ SOLN: 15.0000 mg | Freq: Once | INTRAMUSCULAR | Status: DC

## 2019-03-06 MED ORDER — OXYCODONE HCL 5 MG PO TABS
10.0000 mg | ORAL_TABLET | ORAL | Status: DC | PRN
  Administered 2019-03-06: 12:00:00 10 mg via ORAL
  Filled 2019-03-06: qty 2

## 2019-03-06 MED ORDER — HYDROMORPHONE HCL 2 MG/ML IJ SOLN
2.0000 mg | INTRAMUSCULAR | Status: DC | PRN
  Administered 2019-03-06 (×2): 2 mg via INTRAVENOUS
  Filled 2019-03-06 (×2): qty 1

## 2019-03-06 MED ORDER — ACETAMINOPHEN 500 MG PO TABS
1000.0000 mg | ORAL_TABLET | Freq: Once | ORAL | Status: AC
  Administered 2019-03-06: 1000 mg via ORAL
  Filled 2019-03-06: qty 2

## 2019-03-06 MED ORDER — NALOXONE HCL 0.4 MG/ML IJ SOLN: 0.4000 mg | INTRAMUSCULAR | Status: DC | PRN

## 2019-03-06 MED ORDER — OXYCODONE-ACETAMINOPHEN 10-325 MG PO TABS: 1.0000 | ORAL_TABLET | Freq: Four times a day (QID) | ORAL | 0 refills | Status: DC | PRN

## 2019-03-06 MED ORDER — KETOROLAC TROMETHAMINE 60 MG/2ML IM SOLN
30.0000 mg | Freq: Once | INTRAMUSCULAR | Status: AC
  Administered 2019-03-06: 30 mg via INTRAMUSCULAR
  Filled 2019-03-06: qty 2

## 2019-03-06 MED ORDER — OXYCODONE HCL ER 20 MG PO T12A
20.0000 mg | EXTENDED_RELEASE_TABLET | Freq: Two times a day (BID) | ORAL | Status: DC
  Administered 2019-03-06: 14:00:00 20 mg via ORAL
  Filled 2019-03-06: qty 1

## 2019-03-06 MED ORDER — HYDROMORPHONE HCL 2 MG/ML IJ SOLN
2.0000 mg | Freq: Once | INTRAMUSCULAR | Status: AC
  Administered 2019-03-06: 2 mg via SUBCUTANEOUS
  Filled 2019-03-06: qty 1

## 2019-03-06 MED ORDER — DIPHENHYDRAMINE HCL 25 MG PO CAPS
25.0000 mg | ORAL_CAPSULE | Freq: Once | ORAL | Status: AC
  Administered 2019-03-06: 25 mg via ORAL
  Filled 2019-03-06: qty 1

## 2019-03-06 MED ORDER — ONDANSETRON HCL 4 MG/2ML IJ SOLN
4.0000 mg | Freq: Once | INTRAMUSCULAR | Status: AC
  Administered 2019-03-06: 4 mg via INTRAVENOUS
  Filled 2019-03-06: qty 2

## 2019-03-06 MED ORDER — SODIUM CHLORIDE 0.45 % IV SOLN: INTRAVENOUS | Status: DC

## 2019-03-06 NOTE — Discharge Instructions (Signed)
Sickle Cell Anemia, Adult  Sickle cell anemia is a condition where your red blood cells are shaped like sickles. Red blood cells carry oxygen through the body. Sickle-shaped cells do not live as long as normal red blood cells. They also clump together and block blood from flowing through the blood vessels. This prevents the body from getting enough oxygen. Sickle cell anemia causes organ damage and pain. It also increases the risk of infection. Follow these instructions at home: Medicines  Take over-the-counter and prescription medicines only as told by your doctor.  If you were prescribed an antibiotic medicine, take it as told by your doctor. Do not stop taking the antibiotic even if you start to feel better.  If you develop a fever, do not take medicines to lower the fever right away. Tell your doctor about the fever. Managing pain, stiffness, and swelling  Try these methods to help with pain: ? Use a heating pad. ? Take a warm bath. ? Distract yourself, such as by watching TV. Eating and drinking  Drink enough fluid to keep your pee (urine) clear or pale yellow. Drink more in hot weather and during exercise.  Limit or avoid alcohol.  Eat a healthy diet. Eat plenty of fruits, vegetables, whole grains, and lean protein.  Take vitamins and supplements as told by your doctor. Traveling  When traveling, keep these with you: ? Your medical information. ? The names of your doctors. ? Your medicines.  If you need to take an airplane, talk to your doctor first. Activity  Rest often.  Avoid exercises that make your heart beat much faster, such as jogging. General instructions  Do not use products that have nicotine or tobacco, such as cigarettes and e-cigarettes. If you need help quitting, ask your doctor.  Consider wearing a medical alert bracelet.  Avoid being in high places (high altitudes), such as mountains.  Avoid very hot or cold temperatures.  Avoid places where the  temperature changes a lot.  Keep all follow-up visits as told by your doctor. This is important. Contact a doctor if:  A joint hurts.  Your feet or hands hurt or swell.  You feel tired (fatigued). Get help right away if:  You have symptoms of infection. These include: ? Fever. ? Chills. ? Being very tired. ? Irritability. ? Poor eating. ? Throwing up (vomiting).  You feel dizzy or faint.  You have new stomach pain, especially on the left side.  You have a an erection (priapism) that lasts more than 4 hours.  You have numbness in your arms or legs.  You have a hard time moving your arms or legs.  You have trouble talking.  You have pain that does not go away when you take medicine.  You are short of breath.  You are breathing fast.  You have a long-term cough.  You have pain in your chest.  You have a bad headache.  You have a stiff neck.  Your stomach looks bloated even though you did not eat much.  Your skin is pale.  You suddenly cannot see well. Summary  Sickle cell anemia is a condition where your red blood cells are shaped like sickles.  Follow your doctor's advice on ways to manage pain, food to eat, activities to do, and steps to take for safe travel.  Get medical help right away if you have any signs of infection, such as a fever. This information is not intended to replace advice given to you by   your health care provider. Make sure you discuss any questions you have with your health care provider. Document Revised: 05/25/2018 Document Reviewed: 03/08/2016 Elsevier Patient Education  2020 Elsevier Inc.  

## 2019-03-06 NOTE — ED Triage Notes (Signed)
Pt c/o generalized body sickle cell pain that started last night.

## 2019-03-06 NOTE — Telephone Encounter (Signed)
Patient called, complained of generalized  pain rated at 8/10. Denied chest pain, fever, diarrhea, abdominal pain, nausea/vomitting. Screened negative for Covid-19 symptoms. Admitted to having means of transportation without driving self after treatment. Last took Tylenol 100 mg.  'Run out" of prescribed pain medications.Provider will call the patient whether he can come to the day hospital for treatment.

## 2019-03-06 NOTE — Progress Notes (Signed)
Patient admitted to the day hospital for treatment of sickle cell pain crisis. Patient reported generalized pain rated 8/10. Patient placed on Dilaudid PCA, given IV Dilaudid, IV Zofran, PO Oxycodone, PO Tylenol and hydrated with IV fluids. At discharge patient reported  pain at 5/10. Discharge instructions given to patient. Patient alert, oriented and ambulatory at discharge.

## 2019-03-06 NOTE — Discharge Instructions (Signed)
We are transferring you to the sickle cell clinic for further management of your pain.

## 2019-03-06 NOTE — Progress Notes (Signed)
Timothy Marks is a 29 year old male with a medical history significant for sickle cell disease, chronic pain syndrome, opiate dependence and tolerance, history of anemia of chronic disease, and history of oppositional defiance disorder is requesting Percocet 10-325 mg every 6 hours.  Patient came in 1 week ago for pill count, he was prescribed 60 tablets, pill count was verified by by 2 RNs.  Patient had a total of 44 tablets 1 day after filling prescription. This clinic will not be able to prescribe opiates for this patient going forward.  I have sent a referral to pain management.  We will continue to provide primary care for Timothy Marks. Patient expressed understanding.    [name]  APRN, MSN, FNP-C [name] [address] [place], [place] [address] [phone]

## 2019-03-06 NOTE — ED Provider Notes (Signed)
[facility] DEPT Provider Note   CSN: [id number] Arrival date & time: 03/06/19  [id number]     History Chief Complaint  Patient presents with  . Sickle Cell Pain Crisis  . Generalized Body Aches    Timothy Marks is a 29 y.o. male.  The history is provided by the patient. No language interpreter was used.  Sickle Cell Pain Crisis    29 year old male with significant history of sickle cell disease, aggressive behaviors, chronic pain syndrome, presenting complaining of sickle cell related pain.  Patient reported he was awoke 7:00 this a.m. approximately 3 hours ago with pain throughout his lower back and body.  Pain is sharp throbbing and felt similar to prior sickle cell crisis.  Rated pain as 8 out of 10.  No associated fever chills no runny nose sneezing or coughing chest pain or shortness of breath.  He is unaware of what triggers his pain.  He did reach out and call the sickle cell clinic and was awaiting for them to call back but decided to come here.  No recent sick contact.  States that he has been compliant with his medications  Past Medical History:  Diagnosis Date  . Depression   . 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 Sickle cell anemia (H[facility])   . Vitamin D deficiency y-6    Patient Active Problem List   Diagnosis Date Noted  . SIRS (systemic inflammatory response syndrome) ([place]) 02/10/2019  . Abnormal liver function 11/04/2018  . Sickle cell anemia with crisis ([place]) 05/22/2018  . Acute bilateral low back pain without sciatica   . Other chronic pain   . Anemia of chronic disease   . 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 Physical deconditioning   . Agitation   . Chronic pain syndrome   . Recurrent cold sores   . Fever   . Pain   . Sickle cell crisis ([place]) 07/16/2017  . Adjustment disorder with mixed disturbance of emotions and conduct   . Constipation 08/03/2014  . h/o Priapism  05/06/2014  . Protein-calorie malnutrition, severe ([place]) 04/09/2014  . Major depressive disorder, recurrent, severe without psychotic features ([place])   . 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 Vitamin D deficiency   . Sickle cell anemia ([place]) 03/30/2014  . Hyperbilirubinemia 03/30/2014  . Hypokalemia 02/07/2014  . Aggressive behavior 01/25/2013  . Sickle cell anemia with pain ([place]) 01/24/2013  . Sickle cell pain crisis ([place]) 01/24/2013    Past Surgical History:  Procedure Laterality Date  . CHOLECYSTECTOMY         Family History  Problem Relation Age of Onset  . Sickle cell trait Mother   . Sickle cell trait Father     Social History   Tobacco Use  . Smoking status: Never Smoker  . Smokeless tobacco: Never Used  Substance Use Topics  . Alcohol use: No  . Drug use: Not Currently    Types: Marijuana    Home Medications Prior to Admission medications   Medication Sig Start Date End Date Taking? Authorizing Provider  cholecalciferol (VITAMIN D) 1000 units tablet Take 2 tablets (2,000 Units total) by mouth daily. 12/13/17   Azzie Glatter, FNP  folic acid (FOLVITE) 1 MG tablet Take 1 tablet (1 mg total) by mouth daily. 05/01/17   Dorena Dew, FNP  gabapentin (NEURONTIN) 300 MG capsule TAKE 1 CAPSULE(300 MG) BY MOUTH THREE TIMES DAILY  Patient taking differently: Take 300 mg by mouth 3 (three) times daily.  05/08/18   Lanae Boast, FNP  HYDROcodone-acetaminophen (NORCO/VICODIN) 5-325 MG tablet Take 1 tablet by mouth every 6 (six) hours as needed for moderate pain or severe pain.    [provider]  hydroxyurea (HYDREA) 500 MG capsule Take 1 capsule (500 mg total) by mouth 2 (two) times daily. May take with food to minimize GI side effects. 05/01/17   Dorena Dew, FNP  ibuprofen (ADVIL,MOTRIN) 800 MG tablet Take 1 tablet (800 mg total) by mouth 3 (three) times daily as needed (pain). Patient taking differently: Take 800 mg  by mouth 3 (three) times daily as needed for mild pain or moderate pain.  12/20/17   Azzie Glatter, FNP  Nutritional Supplements (ENSURE ACTIVE HIGH PROTEIN) LIQD Take 1 each by mouth 3 (three) times daily between meals. #90 cans of Ensure 11/13/18   Dorena Dew, FNP  OXYCONTIN 20 MG 12 hr tablet Take 1 tablet (20 mg total) by mouth every 12 (twelve) hours. 02/22/19   Dorena Dew, FNP    Allergies    Buprenorphine hcl, Levaquin [levofloxacin], Meperidine, Morphine, Morphine and related, Toradol [ketorolac tromethamine], Tramadol, Vancomycin, and Zosyn [piperacillin sod-tazobactam so]  Review of Systems   Review of Systems  All other systems reviewed and are negative.   Physical Exam Updated Vital Signs BP 130/75 (BP Location: Right Arm)   Pulse 83   Temp 98.7 F (37.1 C) (Oral)   Resp 16   SpO2 97%   Physical Exam Vitals and nursing note reviewed.  Constitutional:      General: He is not in acute distress.    Appearance: He is well-developed.  HENT:     Head: Atraumatic.  Eyes:     Conjunctiva/sclera: Conjunctivae normal.  Cardiovascular:     Rate and Rhythm: Normal rate and regular rhythm.     Pulses: Normal pulses.     Heart sounds: Normal heart sounds.  Pulmonary:     Effort: Pulmonary effort is normal.     Breath sounds: Normal breath sounds.  Abdominal:     Palpations: Abdomen is soft.     Tenderness: There is no abdominal tenderness.  Musculoskeletal:     Cervical back: Neck supple.  Skin:    Findings: No rash.  Neurological:     Mental Status: He is alert and oriented to person, place, and time.  Psychiatric:        Mood and Affect: Mood normal.     ED Results / Procedures / Treatments   Labs (all labs ordered are listed, but only abnormal results are displayed) Labs Reviewed  CBC WITH DIFFERENTIAL/PLATELET - Abnormal; Notable for the following components:      Result Value   Hemoglobin 11.8 (*)    HCT 35.5 (*)    RDW 19.7 (*)     Platelets 554 (*)    All other components within normal limits  COMPREHENSIVE METABOLIC PANEL  RETICULOCYTES    EKG None  Radiology No results found.  Procedures Procedures (including critical care time)  Medications Ordered in ED Medications  ketorolac (TORADOL) injection 30 mg (30 mg Intramuscular Given 03/06/19 1037)  HYDROmorphone (DILAUDID) injection 2 mg (2 mg Subcutaneous Given 03/06/19 1037)  diphenhydrAMINE (BENADRYL) capsule 25 mg (25 mg Oral Given 03/06/19 1037)    ED Course  I have reviewed the triage vital signs and the nursing notes.  Pertinent labs & imaging results that were available during  my care of the patient were reviewed by me and considered in my medical decision making (see chart for details).    MDM Rules/Calculators/A&P                      BP 130/75   Pulse 82   Temp 98.7 F (37.1 C) (Oral)   Resp 16   SpO2 97%   Final Clinical Impression(s) / ED Diagnoses Final diagnoses:  Chronic pain syndrome    Rx / DC Orders ED Discharge Orders    None     10:23 AM Patient complaining of pain to his low back and body that started earlier this a.m. and feels like today sickle cell crisis.  He does not have any fever chest pain shortness of breath symptoms to suggest acute chest.  Patient will benefit from further management at the sickle cell clinic.  Will reach out and talk to the appropriate provider and help facilitate that transfer.  10:28 AM I reached out to speak with a provider at the sickle cell clinic, who report patient recently was seen at sickle cell center for a pill count and states patient was prescribed 60 Percocetand the very next day with pill count he only has 46 pills left.  They do not think he is appropriate for sickle cell clinic at this time.  Furthermore, patient does not exhibit any emergent condition.  Will obtain basic labs, will give initial dose of pain medication here.  10:54 AM Pt to receive initial dose of pain meds.   [place] from sickle cell clinic called back and agrees to accept pt for further management.  Pt will be discharge to go directly to the sickle cell clinic.    Domenic Moras, PA-C 03/06/19 1055    Hayden Rasmussen, MD 03/06/19 [phone]

## 2019-03-07 NOTE — Discharge Summary (Signed)
[facility] Discharge Summary   Patient ID: Timothy Marks MRN: [id number] DOB/AGE: 10-14-1990 29 y.o.  Admit date: 03/06/2019 Discharge date: 03/07/2019  Primary Care Physician:  Tresa Garter, MD  Admission Diagnoses:  Active Problems:   Sickle cell pain crisis [facility])   Discharge Medications:  Allergies as of 03/06/2019      Reactions   Buprenorphine Hcl Hives   Levaquin [levofloxacin] Itching   Meperidine Rash   Morphine Hives   Morphine And Related Hives   Toradol [ketorolac Tromethamine] Itching   Tramadol Hives   Vancomycin Itching   Zosyn [piperacillin Sod-tazobactam So] Itching, Rash   Has taken rocephin in past      Medication List    TAKE these medications   cholecalciferol 25 MCG (1000 UNIT) tablet Commonly known as: VITAMIN D Take 2 tablets (2,000 Units total) by mouth daily.   Ensure Active High Protein Liqd Take 1 each by mouth 3 (three) times daily between meals. #90 cans of Ensure   folic acid 1 MG tablet Commonly known as: FOLVITE Take 1 tablet (1 mg total) by mouth daily.   gabapentin 300 MG capsule Commonly known as: NEURONTIN TAKE 1 CAPSULE(300 MG) BY MOUTH THREE TIMES DAILY What changed: See the new instructions.   hydroxyurea 500 MG capsule Commonly known as: HYDREA Take 1 capsule (500 mg total) by mouth 2 (two) times daily. May take with food to minimize GI side effects.   ibuprofen 800 MG tablet Commonly known as: ADVIL Take 1 tablet (800 mg total) by mouth 3 (three) times daily as needed (pain). What changed: reasons to take this   oxyCODONE-acetaminophen 10-325 MG tablet Commonly known as: Percocet Take 1 tablet by mouth every 6 (six) hours as needed for pain.   OxyCONTIN 20 mg 12 hr tablet Generic drug: oxyCODONE Take 1 tablet (20 mg total) by mouth every 12 (twelve) hours.        Consults:  None  Significant Diagnostic Studies:  DG Chest Port 1 View  Result Date: 02/10/2019 CLINICAL DATA:   Sickle cell crisis. Fever. EXAM: PORTABLE CHEST 1 VIEW COMPARISON:  02/09/2019 and CT of the chest dated 10/12/2016 FINDINGS: Heart size and pulmonary vascularity are normal. Chronic slight prominence of the main pulmonary artery. Lungs are clear. Chronic sclerosis of the bones consistent with the patient's history of sickle cell disease. IMPRESSION: No acute abnormalities. Electronically Signed   By: Lorriane Shire M.D.   On: 02/10/2019 18:08   DG CHEST PORT 1 VIEW  Result Date: 02/09/2019 CLINICAL DATA:  Sickle cell crisis. EXAM: PORTABLE CHEST 1 VIEW COMPARISON:  11/04/2018 FINDINGS: The heart size and mediastinal contours are within normal limits. Both lungs are clear. The visualized skeletal structures are unremarkable. IMPRESSION: No active disease. Electronically Signed   By: Marin Olp M.D.   On: 02/09/2019 07:41   History of present illness:  Timothy Marks, a 29 year old male with a medical history significant for sickle cell disease, chronic pain syndrome, opiate dependence and tolerance, history of anemia of chronic disease, and history of oppositional defiance disorder presents complaining of low back and lower extremity pain that is consistent with his typical pain crisis. Patient attributes pain crisis to being out of percocet 10-325 over the past several days. Patient was in office on 02/26/2019 for a pill count and had 44 tablets at that time. Reviewed PDMP, he filled prescription on 02/25/2019 for percocet 10-325 mg, 60 pills for 15 days. Patient presented to ER this am  with complaints of pain. Agreed with ER provider that patient was appropriate to transition to sickle cell day infusion center for pain management and extended observation. Patient says that pain is primarily to low back and lower extremities. Pain intensity is 9/10 characterized as constant and aching. He denies fever, chills, chest pain, shortness of breath, urinary symptoms, nausea, vomiting, or diarrhea. No sick  contacts, recent travel or exposure to COVID 19.    [facility] Course: Timothy Marks, a 29 year old male with a medical history significant for sickle cell disease was admitted for management of pain crisis.  Reviewed labs which are consistent with his baseline.  Pain managed with Dilaudid 2 mg IV times 2 doses.  Oxycontin 20 mg times one  Oxycodone 10 mg times one dose Zofran 4 mg times one dose IV fluids, 0.45% saline at 100 ml/hr.  Patient's pain intensity decreased to 5/10. He does not warrant inpatient admission on today.  Discussed opiate medication history with Dr. Doreene Burke. Will send Percocet 10-325 mg #30 to patient's pharmacy. Also, sent referral to pain management. I discussed this with [name] at length. He expressed understanding.  Patient alert, oriented, and ambulating without assistance.  Patient will discharge home in a hemodynamically stable condition.    Discharge instructions:  Resume all home medications.   Follow up with PCP as previously  scheduled.   Discussed the importance of drinking 64 ounces of water daily, dehydration of red blood cells may lead further sickling.   Avoid all stressors that precipitate sickle cell pain crisis.     The patient was given clear instructions to go to ER or return to medical center if symptoms do not improve, worsen or new problems develop.    Physical Exam at Discharge:  BP 118/84 (BP Location: Right Arm)   Pulse 75   Temp 98.5 F (36.9 C) (Oral)   Resp 16   SpO2 100%  Physical Exam Constitutional:      Appearance: He is normal weight.  HENT:     Mouth/Throat:     Mouth: Mucous membranes are moist.     Pharynx: Oropharynx is clear.  Cardiovascular:     Rate and Rhythm: Normal rate and regular rhythm.     Pulses: Normal pulses.  Pulmonary:     Effort: Pulmonary effort is normal.     Breath sounds: Normal breath sounds.  Abdominal:     General: Abdomen is flat. Bowel sounds are normal.   Skin:    General: Skin is warm.  Neurological:     General: No focal deficit present.     Mental Status: He is alert. Mental status is at baseline.  Psychiatric:        Mood and Affect: Mood normal.        Behavior: Behavior normal.        Thought Content: Thought content normal.        Judgment: Judgment normal.       Disposition at Discharge: Discharge disposition: 01-Home or Self Care       Discharge Orders: Discharge Instructions    Discharge patient   Complete by: As directed    Discharge disposition: 01-Home or Self Care   Discharge patient date: 03/06/2019      Condition at Discharge:   Stable  Time spent on Discharge:  Greater than 30 minutes.  Signed: [name]  APRN, MSN, FNP-C [facility] [place],  [place] [address] [phone]  03/07/2019, 5:59 AM

## 2019-03-07 NOTE — H&P (Signed)
[facility] History and Physical   Date: 03/07/2019  Patient name: Timothy Marks Medical record number: [id number] Date of birth: 1990-05-14 Age: 29 y.o. Gender: male PCP: Tresa Garter, MD  Attending physician: No att. providers found  Chief Complaint: Sickle cell disease  History of Present Illness: Timothy Marks, a 29 year old male with a medical history significant for sickle cell disease, chronic pain syndrome, opiate dependence and tolerance, history of anemia of chronic disease, and history of oppositional defiance disorder presents complaining of low back and lower extremity pain that is consistent with his typical pain crisis. Patient attributes pain crisis to being out of percocet 10-325 over the past several days. Patient was in office on 02/26/2019 for a pill count and had 44 tablets at that time. Reviewed PDMP, he filled prescription on 02/25/2019 for percocet 10-325 mg, 60 pills for 15 days. Patient presented to ER this am with complaints of pain. Agreed with ER provider that patient was appropriate to transition to sickle cell day infusion center for pain management and extended observation. Patient says that pain is primarily to low back and lower extremities. Pain intensity is 9/10 characterized as constant and aching. He denies fever, chills, chest pain, shortness of breath, urinary symptoms, nausea, vomiting, or diarrhea. No sick contacts, recent travel or exposure to COVID 19.     Meds: No medications prior to admission.    Allergies: Buprenorphine hcl, Levaquin [levofloxacin], Meperidine, Morphine, Morphine and related, Toradol [ketorolac tromethamine], Tramadol, Vancomycin, and Zosyn [piperacillin sod-tazobactam so] Past Medical History:  Diagnosis Date  . Depression   . 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 Sickle cell anemia (HCC)   . Vitamin D deficiency y-6   Past Surgical  History:  Procedure Laterality Date  . CHOLECYSTECTOMY     Family History  Problem Relation Age of Onset  . Sickle cell trait Mother   . Sickle cell trait Father    Social History   Socioeconomic History  . Marital status: Single    Spouse name: Not on file  . Number of children: Not on file  . Years of education: Not on file  . Highest education level: Not on file  Occupational History  . Not on file  Tobacco Use  . Smoking status: Never Smoker  . Smokeless tobacco: Never Used  Substance and Sexual Activity  . Alcohol use: No  . Drug use: Not Currently    Types: Marijuana  . Sexual activity: Not on file  Other Topics Concern  . Not on file  Social History Narrative  . Not on file   Social Determinants of Health   Financial Resource Strain:   . Difficulty of Paying Living Expenses: Not on file  Food Insecurity:   . Worried About Charity fundraiser in the Last Year: Not on file  . Ran Out of Food in the Last Year: Not on file  Transportation Needs:   . Lack of Transportation (Medical): Not on file  . Lack of Transportation (Non-Medical): Not on file  Physical Activity:   . Days of Exercise per Week: Not on file  . Minutes of Exercise per Session: Not on file  Stress:   . Feeling of Stress : Not on file  Social Connections:   . Frequency of Communication with Friends and Family: Not on file  . Frequency of Social Gatherings with Friends and Family: Not on file  .  Attends Religious Services: Not on file  . Active Member of Clubs or Organizations: Not on file  . Attends Archivist Meetings: Not on file  . Marital Status: Not on file  Intimate Partner Violence:   . Fear of Current or Ex-Partner: Not on file  . Emotionally Abused: Not on file  . Physically Abused: Not on file  . Sexually Abused: Not on file   Review of Systems  Constitutional: Negative.   HENT: Negative.   Eyes: Negative.   Respiratory: Negative.   Cardiovascular: Negative.    Gastrointestinal: Negative.   Genitourinary: Negative.   Musculoskeletal: Positive for back pain and joint pain.  Skin: Negative.   Neurological: Negative.   Psychiatric/Behavioral: Negative.      Physical Exam: Blood pressure 118/84, pulse 75, temperature 98.5 F (36.9 C), temperature source Oral, resp. rate 16, SpO2 100 %.  Physical Exam Constitutional:      Appearance: Normal appearance.  Eyes:     Pupils: Pupils are equal, round, and reactive to light.  Cardiovascular:     Rate and Rhythm: Normal rate and regular rhythm.  Pulmonary:     Effort: Pulmonary effort is normal.     Breath sounds: Normal breath sounds.  Abdominal:     General: Bowel sounds are normal.     Palpations: Abdomen is soft.  Musculoskeletal:        General: Normal range of motion.  Skin:    General: Skin is warm.  Neurological:     General: No focal deficit present.     Mental Status: He is alert. Mental status is at baseline.  Psychiatric:        Mood and Affect: Mood normal.        Behavior: Behavior normal.        Thought Content: Thought content normal.        Judgment: Judgment normal.    Lab results: Results for orders placed or performed during the hospital encounter of 03/06/19 (from the past 24 hour(s))  Comprehensive metabolic panel     Status: Abnormal   Collection Time: 03/06/19 10:30 AM  Result Value Ref Range   Sodium 140 135 - 145 mmol/L   Potassium 4.0 3.5 - 5.1 mmol/L   Chloride 106 98 - 111 mmol/L   CO2 27 22 - 32 mmol/L   Glucose, Bld 88 70 - 99 mg/dL   BUN 7 6 - 20 mg/dL   Creatinine, Ser 0.50 (L) 0.61 - 1.24 mg/dL   Calcium 9.3 8.9 - 10.3 mg/dL   Total Protein 7.8 6.5 - 8.1 g/dL   Albumin 4.2 3.5 - 5.0 g/dL   AST 24 15 - 41 U/L   ALT 14 0 - 44 U/L   Alkaline Phosphatase 78 38 - 126 U/L   Total Bilirubin 1.1 0.3 - 1.2 mg/dL   GFR calc non Af Amer >60 >60 mL/min   GFR calc Af Amer >60 >60 mL/min   Anion gap 7 5 - 15  CBC with Differential     Status: Abnormal    Collection Time: 03/06/19 10:30 AM  Result Value Ref Range   WBC 7.7 4.0 - 10.5 K/uL   RBC 4.25 4.22 - 5.81 MIL/uL   Hemoglobin 11.8 (L) 13.0 - 17.0 g/dL   HCT 35.5 (L) 39.0 - 52.0 %   MCV 83.5 80.0 - 100.0 fL   MCH 27.8 26.0 - 34.0 pg   MCHC 33.2 30.0 - 36.0 g/dL   RDW 19.7 (H) 11.5 -  15.5 %   Platelets 554 (H) 150 - 400 K/uL   nRBC 3.1 (H) 0.0 - 0.2 %   Neutrophils Relative % 65 %   Neutro Abs 4.9 1.7 - 7.7 K/uL   Lymphocytes Relative 26 %   Lymphs Abs 2.0 0.7 - 4.0 K/uL   Monocytes Relative 8 %   Monocytes Absolute 0.7 0.1 - 1.0 K/uL   Eosinophils Relative 1 %   Eosinophils Absolute 0.1 0.0 - 0.5 K/uL   Basophils Relative 0 %   Basophils Absolute 0.0 0.0 - 0.1 K/uL   Immature Granulocytes 0 %   Abs Immature Granulocytes 0.03 0.00 - 0.07 K/uL   Polychromasia PRESENT    Target Cells PRESENT   Reticulocytes     Status: Abnormal   Collection Time: 03/06/19 10:30 AM  Result Value Ref Range   Retic Ct Pct 6.0 (H) 0.4 - 3.1 %   RBC. 4.21 (L) 4.22 - 5.81 MIL/uL   Retic Count, Absolute 251.3 (H) 19.0 - 186.0 K/uL   Immature Retic Fract 40.6 (H) 2.3 - 15.9 %    Imaging results:  No results found.   Assessment & Plan: Patient admitted to sickle cell day infusion center for management of pain crisis.  Patient is opiate tolerant IV fluids, 0.45% saline at 100 ml/hr Dilaudid 2 mg IV times 2 doses Oxycodone 10 mg times one Oxycontin 20 mg times one Toradol 15 mg IV times one dose Tylenol 1000 mg by mouth times one dose Review CBC with differential, complete metabolic panel, and reticulocytes as results become available. Baseline hemoglobin is Pain intensity will be reevaluated in context of functioning and relationship to baseline as care progress If pain intensity remains elevated and/or sudden change in hemodynamic stability transition to inpatient services for higher level of care.    [name]  APRN, MSN, FNP-C [facility] [address] [place], [place] [address] [phone]   03/07/2019, 6:23 AM

## 2019-03-12 ENCOUNTER — Other Ambulatory Visit: Payer: Self-pay | Admitting: Family Medicine

## 2019-03-12 ENCOUNTER — Telehealth: Payer: Self-pay | Admitting: Family Medicine

## 2019-03-12 DIAGNOSIS — G894 Chronic pain syndrome: Secondary | ICD-10-CM

## 2019-03-12 DIAGNOSIS — D571 Sickle-cell disease without crisis: Secondary | ICD-10-CM

## 2019-03-12 MED ORDER — GABAPENTIN 300 MG PO CAPS: 300.0000 mg | ORAL_CAPSULE | Freq: Three times a day (TID) | ORAL | 5 refills | Status: DC

## 2019-03-12 MED ORDER — IBUPROFEN 800 MG PO TABS: 800.0000 mg | ORAL_TABLET | Freq: Three times a day (TID) | ORAL | 0 refills | Status: DC | PRN

## 2019-03-12 NOTE — Telephone Encounter (Signed)
done

## 2019-03-12 NOTE — Telephone Encounter (Signed)
Estill Bamberg, the referral rep with [facility] called me back. They have been having trouble with their fax machine but it is up and running now. They did not receive the fax I sent on 03/06/2019 for pain management. Estill Bamberg states once they receive the fax they will be able to have in scheduled within 1 week.

## 2019-03-12 NOTE — Progress Notes (Signed)
Patient is requesting opiate pain medication. I have sent a referral to pain management. This was discussed with patient on last week. At that time he was given Percocet #42. He last had Oxycontin filled on 02/25/2019 #60, which is not due until 03/28/2019. This patient should not be out of opiate medications.  This provider will no longer prescribe opiate medications. Ibuprofen and gabapentin have both been sent to patient's pharmacy. I will defer to pain management for opiate medication management.   Meds ordered this encounter  Medications  . ibuprofen (ADVIL) 800 MG tablet    Sig: Take 1 tablet (800 mg total) by mouth 3 (three) times daily as needed (pain).    Dispense:  30 tablet    Refill:  0    Order Specific Question:   Supervising Provider    Answer:   Tresa Garter [id number]  . gabapentin (NEURONTIN) 300 MG capsule    Sig: Take 1 capsule (300 mg total) by mouth 3 (three) times daily.    Dispense:  90 capsule    Refill:  5    Order Specific Question:   Supervising Provider    Answer:   Tresa Garter [id number]   [name]  APRN, MSN, FNP-C [facility] [address] [place], [place] [address] [phone]

## 2019-03-13 ENCOUNTER — Telehealth: Payer: Self-pay | Admitting: Family Medicine

## 2019-03-13 NOTE — Telephone Encounter (Signed)
Notified patient concerning referral. Reiterated that a referral has been sent to pain management. This clinic will continue to follow patient for primary care. Will defer to pain management for opiate management going forward.   Timothy Pounds  APRN, MSN, FNP-C [facility] [address] [place], [place] [address] [phone]

## 2019-03-13 NOTE — Telephone Encounter (Signed)
Called patient, informed we can not write rx for pain medication any longer. Asked that he make sure to schedule with [facility] for rx on pain medications. Advised if he is having a crisis to call the day hospital for treatment or go to ER if after hours. Thanks!

## 2019-03-15 ENCOUNTER — Encounter (HOSPITAL_COMMUNITY): Payer: Self-pay | Admitting: Emergency Medicine

## 2019-03-15 ENCOUNTER — Emergency Department (HOSPITAL_COMMUNITY)
Admission: EM | Admit: 2019-03-15 | Discharge: 2019-03-15 | Disposition: A | Discharge status: Home / Self Care | Payer: Medicaid Other | Attending: Emergency Medicine | Admitting: Emergency Medicine

## 2019-03-15 ENCOUNTER — Other Ambulatory Visit: Payer: Self-pay

## 2019-03-15 DIAGNOSIS — Z79899 Other long term (current) drug therapy: Secondary | ICD-10-CM | POA: Insufficient documentation

## 2019-03-15 DIAGNOSIS — D696 Thrombocytopenia, unspecified: Secondary | ICD-10-CM | POA: Diagnosis not present

## 2019-03-15 DIAGNOSIS — M545 Low back pain: Secondary | ICD-10-CM | POA: Diagnosis present

## 2019-03-15 DIAGNOSIS — D5700 Hb-SS disease with crisis, unspecified: Secondary | ICD-10-CM | POA: Insufficient documentation

## 2019-03-15 LAB — CBC WITH DIFFERENTIAL/PLATELET
Abs Immature Granulocytes: 0.01 10*3/uL (ref 0.00–0.07)
Basophils Absolute: 0.1 10*3/uL (ref 0.0–0.1)
Basophils Relative: 1 %
Eosinophils Absolute: 0.2 10*3/uL (ref 0.0–0.5)
Eosinophils Relative: 2 %
HCT: 39.4 % (ref 39.0–52.0)
Hemoglobin: 12.5 g/dL — ABNORMAL LOW (ref 13.0–17.0)
Immature Granulocytes: 0 %
Lymphocytes Relative: 47 %
Lymphs Abs: 3.3 10*3/uL (ref 0.7–4.0)
MCH: 27.3 pg (ref 26.0–34.0)
MCHC: 31.7 g/dL (ref 30.0–36.0)
MCV: 86 fL (ref 80.0–100.0)
Monocytes Absolute: 0.7 10*3/uL (ref 0.1–1.0)
Monocytes Relative: 9 %
Neutro Abs: 2.9 10*3/uL (ref 1.7–7.7)
Neutrophils Relative %: 41 %
Platelets: 166 10*3/uL (ref 150–400)
RBC: 4.58 MIL/uL (ref 4.22–5.81)
RDW: 20.6 % — ABNORMAL HIGH (ref 11.5–15.5)
WBC: 7.1 10*3/uL (ref 4.0–10.5)
nRBC: 0.4 % — ABNORMAL HIGH (ref 0.0–0.2)

## 2019-03-15 LAB — RETICULOCYTES
Immature Retic Fract: 37.8 % — ABNORMAL HIGH (ref 2.3–15.9)
RBC.: 4.54 MIL/uL (ref 4.22–5.81)
Retic Count, Absolute: 155.3 10*3/uL (ref 19.0–186.0)
Retic Ct Pct: 3.4 % — ABNORMAL HIGH (ref 0.4–3.1)

## 2019-03-15 LAB — COMPREHENSIVE METABOLIC PANEL
ALT: 19 U/L (ref 0–44)
AST: 41 U/L (ref 15–41)
Albumin: 4.5 g/dL (ref 3.5–5.0)
Alkaline Phosphatase: 75 U/L (ref 38–126)
Anion gap: 10 (ref 5–15)
BUN: 9 mg/dL (ref 6–20)
CO2: 23 mmol/L (ref 22–32)
Calcium: 9.3 mg/dL (ref 8.9–10.3)
Chloride: 105 mmol/L (ref 98–111)
Creatinine, Ser: 0.56 mg/dL — ABNORMAL LOW (ref 0.61–1.24)
GFR calc Af Amer: >60 mL/min (ref >60)
GFR calc non Af Amer: >60 mL/min (ref >60)
Glucose, Bld: 77 mg/dL (ref 70–99)
Potassium: 4 mmol/L (ref 3.5–5.1)
Sodium: 138 mmol/L (ref 135–145)
Total Bilirubin: 1.9 mg/dL — ABNORMAL HIGH (ref 0.3–1.2)
Total Protein: 8.1 g/dL (ref 6.5–8.1)

## 2019-03-15 MED ORDER — DIPHENHYDRAMINE HCL 25 MG PO CAPS
25.0000 mg | ORAL_CAPSULE | ORAL | Status: DC | PRN
  Administered 2019-03-15: 25 mg via ORAL
  Filled 2019-03-15: qty 1

## 2019-03-15 MED ORDER — HYDROMORPHONE HCL 2 MG/ML IJ SOLN
2.0000 mg | INTRAMUSCULAR | Status: AC
  Administered 2019-03-15: 2 mg via INTRAVENOUS
  Filled 2019-03-15: qty 1

## 2019-03-15 MED ORDER — HYDROMORPHONE HCL 2 MG/ML IJ SOLN
2.0000 mg | Freq: Once | INTRAMUSCULAR | Status: AC
  Administered 2019-03-15: 2 mg via INTRAVENOUS
  Filled 2019-03-15: qty 1

## 2019-03-15 MED ORDER — ONDANSETRON HCL 4 MG/2ML IJ SOLN
4.0000 mg | INTRAMUSCULAR | Status: DC | PRN
  Administered 2019-03-15: 4 mg via INTRAVENOUS
  Filled 2019-03-15: qty 2

## 2019-03-15 NOTE — ED Notes (Signed)
Unsuccessful IV attempt x2.  

## 2019-03-15 NOTE — ED Notes (Signed)
Patient request lab draw with an iv start.

## 2019-03-15 NOTE — ED Triage Notes (Signed)
Pt states that he saw his PCP today for back pain related to [facility] and was prescribed more pain medication but cannot get it for 24 hours because of his medicaid. Alert and oriented.

## 2019-03-15 NOTE — ED Provider Notes (Signed)
[facility] DEPT Provider Note   CSN: [id number] Arrival date & time: 03/15/19  1433     History Chief Complaint  Patient presents with  . Sickle Cell Pain Crisis    Timothy Marks is a 29 y.o. male with a history of sickle cell anemia, depression, chronic pain syndrome, and thrombocytopenia who presents to the emergency department with complaints of back pain which began today.  Patient states the pain is in his lower back, constant, severe, no alleviating or aggravating factors.  Feels similar to prior sickle cell pain.  He states he went to his PCP who provided her a prescription for his pain medication but he is unable to fill this until tomorrow because of his Medicaid.  No trauma or change in activity. Denies numbness, tingling, weakness, saddle anesthesia, incontinence to bowel/bladder, fever, chills, IV drug use, dysuria, or hx of cancer. Patient has not had prior back surgeries.  Denies fever, chills, dyspnea, cough, or chest pain.  HPI     Past Medical History:  Diagnosis Date  . Depression   . 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 Sickle cell anemia (HCC)   . Vitamin D deficiency y-6    Patient Active Problem List   Diagnosis Date Noted  . SIRS (systemic inflammatory response syndrome) ([place]) 02/10/2019  . Abnormal liver function 11/04/2018  . Sickle cell anemia with crisis ([place]) 05/22/2018  . Acute bilateral low back pain without sciatica   . Other chronic pain   . Anemia of chronic disease   . Thrombocythemia ([place])   . Physical deconditioning   . Agitation   . Chronic pain syndrome   . Recurrent cold sores   . Fever   . Pain   . Sickle cell crisis ([place]) 07/16/2017  . Adjustment disorder with mixed disturbance of emotions and conduct   . Constipation 08/03/2014  . 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 Protein-calorie malnutrition, severe ([place]) 04/09/2014  . Major depressive  disorder, recurrent, severe without psychotic features ([place])   . 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 Vitamin D deficiency   . Sickle cell anemia ([place]) 03/30/2014  . Hyperbilirubinemia 03/30/2014  . Hypokalemia 02/07/2014  . Aggressive behavior 01/25/2013  . Sickle cell anemia with pain ([place]) 01/24/2013  . Sickle cell pain crisis ([place]) 01/24/2013    Past Surgical History:  Procedure Laterality Date  . CHOLECYSTECTOMY         Family History  Problem Relation Age of Onset  . Sickle cell trait Mother   . Sickle cell trait Father     Social History   Tobacco Use  . Smoking status: Never Smoker  . Smokeless tobacco: Never Used  Substance Use Topics  . Alcohol use: No  . Drug use: Not Currently    Types: Marijuana    Home Medications Prior to Admission medications   Medication Sig Start Date End Date Taking? Authorizing Provider  cholecalciferol (VITAMIN D) 1000 units tablet Take 2 tablets (2,000 Units total) by mouth daily. 12/13/17   Azzie Glatter, FNP  folic acid (FOLVITE) 1 MG tablet Take 1 tablet (1 mg total) by mouth daily. 05/01/17   Dorena Dew, FNP  gabapentin (NEURONTIN) 300 MG capsule Take 1 capsule (300 mg total) by mouth 3 (three) times daily. 03/12/19   Dorena Dew, FNP  hydroxyurea (HYDREA) 500 MG capsule Take 1 capsule (500 mg total) by mouth 2 (  two) times daily. May take with food to minimize GI side effects. 05/01/17   Dorena Dew, FNP  ibuprofen (ADVIL) 800 MG tablet Take 1 tablet (800 mg total) by mouth 3 (three) times daily as needed (pain). 03/12/19   Dorena Dew, FNP  Nutritional Supplements (ENSURE ACTIVE HIGH PROTEIN) LIQD Take 1 each by mouth 3 (three) times daily between meals. #90 cans of Ensure 11/13/18   Dorena Dew, FNP  oxyCODONE-acetaminophen (PERCOCET) 10-325 MG tablet Take 1 tablet by mouth every 6 (six) hours as needed for pain. 03/06/19 03/05/20  Dorena Dew, FNP  OXYCONTIN  20 MG 12 hr tablet Take 1 tablet (20 mg total) by mouth every 12 (twelve) hours. 02/22/19   Dorena Dew, FNP    Allergies    Buprenorphine hcl, Levaquin [levofloxacin], Meperidine, Morphine, Morphine and related, Toradol [ketorolac tromethamine], Tramadol, Vancomycin, and Zosyn [piperacillin sod-tazobactam so]  Review of Systems   Review of Systems  Constitutional: Negative for chills, fever and unexpected weight change.  Respiratory: Negative for cough and shortness of breath.   Cardiovascular: Negative for chest pain.  Gastrointestinal: Negative for abdominal pain, nausea and vomiting.  Genitourinary: Negative for dysuria.  Musculoskeletal: Positive for back pain.  Neurological: Negative for weakness and numbness.       Negative for saddle anesthesia or bowel/bladder incontinence.   All other systems reviewed and are negative.   Physical Exam Updated Vital Signs BP 116/78 (BP Location: Left Arm)   Pulse 84   Temp 98.4 F (36.9 C) (Oral)   Resp 18   Ht 6\' 2"  (1.88 m)   Wt 65.8 kg   SpO2 100%   BMI 18.62 kg/m   Physical Exam Vitals and nursing note reviewed.  Constitutional:      General: He is not in acute distress.    Appearance: He is well-developed. He is not toxic-appearing.  HENT:     Head: Normocephalic and atraumatic.  Eyes:     General:        Right eye: No discharge.        Left eye: No discharge.     Conjunctiva/sclera: Conjunctivae normal.  Cardiovascular:     Rate and Rhythm: Normal rate and regular rhythm.  Pulmonary:     Effort: Pulmonary effort is normal. No respiratory distress.     Breath sounds: Normal breath sounds. No wheezing, rhonchi or rales.  Abdominal:     General: There is no distension.     Palpations: Abdomen is soft.     Tenderness: There is no abdominal tenderness.  Musculoskeletal:     Cervical back: Normal range of motion and neck supple. No spinous process tenderness or muscular tenderness.     Comments: No obvious  deformity, appreciable swelling, erythema, ecchymosis, significant open wounds, or increased warmth.  Extremities: Normal ROM. Nontender.  Back: No point/focal vertebral tenderness, no palpable step off or crepitus.  Diffusely tender throughout the lumbar region including midline and bilateral paraspinal muscles.   Skin:    General: Skin is warm and dry.     Findings: No rash.  Neurological:     Mental Status: He is alert.     Deep Tendon Reflexes:     Reflex Scores:      Patellar reflexes are 2+ on the right side and 2+ on the left side.    Comments: Sensation grossly intact to bilateral lower extremities. 5/5 symmetric strength with plantar/dorsiflexion bilaterally. Gait is intact without obvious foot drop.  Psychiatric:        Behavior: Behavior normal.     ED Results / Procedures / Treatments   Labs (all labs ordered are listed, but only abnormal results are displayed) Labs Reviewed  COMPREHENSIVE METABOLIC PANEL - Abnormal; Notable for the following components:      Result Value   Creatinine, Ser 0.56 (*)    Total Bilirubin 1.9 (*)    All other components within normal limits  CBC WITH DIFFERENTIAL/PLATELET - Abnormal; Notable for the following components:   Hemoglobin 12.5 (*)    RDW 20.6 (*)    nRBC 0.4 (*)    All other components within normal limits  RETICULOCYTES - Abnormal; Notable for the following components:   Retic Ct Pct 3.4 (*)    Immature Retic Fract 37.8 (*)    All other components within normal limits    EKG None  Radiology No results found.  Procedures Procedures (including critical care time)  Medications Ordered in ED Medications  diphenhydrAMINE (BENADRYL) capsule 25-50 mg (25 mg Oral Given 03/15/19 1937)  ondansetron (ZOFRAN) injection 4 mg (4 mg Intravenous Given 03/15/19 1938)  HYDROmorphone (DILAUDID) injection 2 mg (2 mg Intravenous Given 03/15/19 1938)  HYDROmorphone (DILAUDID) injection 2 mg (2 mg Intravenous Given 03/15/19 2044)    HYDROmorphone (DILAUDID) injection 2 mg (2 mg Intravenous Given 03/15/19 2153)    ED Course  I have reviewed the triage vital signs and the nursing notes.  Pertinent labs & imaging results that were available during my care of the patient were reviewed by me and considered in my medical decision making (see chart for details).    MDM Rules/Calculators/A&P                      Patient presents to the emergency department with complaints of back pain which he states is consistent with his prior sickle cell pain crises.  Patient is nontoxic-appearing, resting comfortably, vitals WNL.  He has diffuse lumbar tenderness to palpation.  He denies trauma, no point/focal vertebral tenderness, do not suspect fracture or subluxation.  He is afebrile, denies IVDU, do not suspect epidural abscess.  No complaints of incontinence/retention or saddle anesthesia, no neuro deficits, do not suspect cauda equina or cord compression.  Will treat as a sickle cell pain crises.  No complaints of fever/chest pain/dyspnea, no respiratory distress, afebrile here, do not suspect acute chest syndrome.  CBC: Anemia consistent with baseline.  No leukocytosis. CMP: Mildly elevated T bili, LFTs WNL.  No significant electrolyte derangement. Reticulocytes improved from prior.  21:20: RE-EVAL: Patient feeling mildly improved. Will give 3rd dose of Dilaudid with plan for re-assessment to determine disposition.   22:18: RE-EVAL: Patient feeling much better. States he feels comfortable going home.   I discussed results, treatment plan, need for follow-up, and return precautions with the patient. Provided opportunity for questions, patient confirmed understanding and is in agreement with plan.    Final Clinical Impression(s) / ED Diagnoses Final diagnoses:  Sickle cell anemia with pain [facility])    Rx / DC Orders ED Discharge Orders    None       Amaryllis Dyke, PA-C 03/15/19 2220    Lucrezia Starch,  MD 03/16/19 2128

## 2019-03-15 NOTE — Discharge Instructions (Addendum)
You were seen in the ER today for back pain.  Your labs were reassuring and similar to prior on records. Please take your at home pain medication as prescribed once able to be filled.  Please follow up with primary care within 3 days.  Return to the ED for new or worsening symptoms including but not limited to fever, chest pain, trouble breathing, worsened pain, numbness, weakness, loss of bowel/bladder control, trouble walking, or any other concerns.

## 2019-03-15 NOTE — ED Notes (Signed)
Unsuccessful IV attempt by Nila Nephew RN x2. IV team consult placed.

## 2019-03-18 ENCOUNTER — Emergency Department (HOSPITAL_COMMUNITY)
Admission: EM | Admit: 2019-03-18 | Discharge: 2019-03-18 | Disposition: A | Discharge status: Home / Self Care | Payer: Medicaid Other | Attending: Emergency Medicine | Admitting: Emergency Medicine

## 2019-03-18 ENCOUNTER — Other Ambulatory Visit: Payer: Self-pay

## 2019-03-18 ENCOUNTER — Encounter (HOSPITAL_COMMUNITY): Payer: Self-pay

## 2019-03-18 DIAGNOSIS — Z79899 Other long term (current) drug therapy: Secondary | ICD-10-CM | POA: Insufficient documentation

## 2019-03-18 DIAGNOSIS — M545 Low back pain: Secondary | ICD-10-CM | POA: Diagnosis present

## 2019-03-18 DIAGNOSIS — D5700 Hb-SS disease with crisis, unspecified: Secondary | ICD-10-CM | POA: Insufficient documentation

## 2019-03-18 LAB — RETICULOCYTES
Immature Retic Fract: 32.5 % — ABNORMAL HIGH (ref 2.3–15.9)
RBC.: 4.39 MIL/uL (ref 4.22–5.81)
Retic Count, Absolute: 159.4 10*3/uL (ref 19.0–186.0)
Retic Ct Pct: 3.6 % — ABNORMAL HIGH (ref 0.4–3.1)

## 2019-03-18 LAB — COMPREHENSIVE METABOLIC PANEL
ALT: 20 U/L (ref 0–44)
AST: 45 U/L — ABNORMAL HIGH (ref 15–41)
Albumin: 4.6 g/dL (ref 3.5–5.0)
Alkaline Phosphatase: 70 U/L (ref 38–126)
Anion gap: 9 (ref 5–15)
BUN: 6 mg/dL (ref 6–20)
CO2: 25 mmol/L (ref 22–32)
Calcium: 9.3 mg/dL (ref 8.9–10.3)
Chloride: 104 mmol/L (ref 98–111)
Creatinine, Ser: 0.63 mg/dL (ref 0.61–1.24)
GFR calc Af Amer: >60 mL/min (ref >60)
GFR calc non Af Amer: >60 mL/min (ref >60)
Glucose, Bld: 97 mg/dL (ref 70–99)
Potassium: 4.9 mmol/L (ref 3.5–5.1)
Sodium: 138 mmol/L (ref 135–145)
Total Bilirubin: 1.9 mg/dL — ABNORMAL HIGH (ref 0.3–1.2)
Total Protein: 8.1 g/dL (ref 6.5–8.1)

## 2019-03-18 LAB — CBC WITH DIFFERENTIAL/PLATELET
Abs Immature Granulocytes: 0.02 10*3/uL (ref 0.00–0.07)
Basophils Absolute: 0 10*3/uL (ref 0.0–0.1)
Basophils Relative: 0 %
Eosinophils Absolute: 0.1 10*3/uL (ref 0.0–0.5)
Eosinophils Relative: 1 %
HCT: 35.5 % — ABNORMAL LOW (ref 39.0–52.0)
Hemoglobin: 12.3 g/dL — ABNORMAL LOW (ref 13.0–17.0)
Immature Granulocytes: 0 %
Lymphocytes Relative: 28 %
Lymphs Abs: 2.2 10*3/uL (ref 0.7–4.0)
MCH: 28.3 pg (ref 26.0–34.0)
MCHC: 34.6 g/dL (ref 30.0–36.0)
MCV: 81.6 fL (ref 80.0–100.0)
Monocytes Absolute: 0.7 10*3/uL (ref 0.1–1.0)
Monocytes Relative: 9 %
Neutro Abs: 4.7 10*3/uL (ref 1.7–7.7)
Neutrophils Relative %: 62 %
Platelets: 379 10*3/uL (ref 150–400)
RBC: 4.35 MIL/uL (ref 4.22–5.81)
RDW: 20 % — ABNORMAL HIGH (ref 11.5–15.5)
WBC: 7.8 10*3/uL (ref 4.0–10.5)
nRBC: 0.6 % — ABNORMAL HIGH (ref 0.0–0.2)

## 2019-03-18 LAB — URINALYSIS, ROUTINE W REFLEX MICROSCOPIC
Bilirubin Urine: NEGATIVE
Glucose, UA: NEGATIVE mg/dL
Hgb urine dipstick: NEGATIVE
Ketones, ur: NEGATIVE mg/dL
Leukocytes,Ua: NEGATIVE
Nitrite: NEGATIVE
Protein, ur: NEGATIVE mg/dL
Specific Gravity, Urine: 1.01 (ref 1.005–1.030)
pH: 9 — ABNORMAL HIGH (ref 5.0–8.0)

## 2019-03-18 MED ORDER — SODIUM CHLORIDE 0.9% FLUSH
3.0000 mL | Freq: Once | INTRAVENOUS | Status: AC
  Administered 2019-03-18: 3 mL via INTRAVENOUS

## 2019-03-18 MED ORDER — HYDROMORPHONE HCL 2 MG/ML IJ SOLN
2.0000 mg | INTRAMUSCULAR | Status: AC
  Administered 2019-03-18: 14:00:00 2 mg via SUBCUTANEOUS
  Filled 2019-03-18: qty 1

## 2019-03-18 MED ORDER — HYDROMORPHONE HCL 2 MG/ML IJ SOLN
2.0000 mg | Freq: Once | INTRAMUSCULAR | Status: AC
  Administered 2019-03-18: 2 mg via INTRAVENOUS

## 2019-03-18 MED ORDER — ONDANSETRON HCL 4 MG/2ML IJ SOLN
4.0000 mg | INTRAMUSCULAR | Status: DC | PRN
  Administered 2019-03-18: 4 mg via INTRAVENOUS
  Filled 2019-03-18: qty 2

## 2019-03-18 MED ORDER — HYDROMORPHONE HCL 2 MG/ML IJ SOLN
2.0000 mg | INTRAMUSCULAR | Status: DC
  Filled 2019-03-18: qty 1

## 2019-03-18 MED ORDER — HYDROMORPHONE HCL 2 MG/ML IJ SOLN
2.0000 mg | Freq: Once | INTRAMUSCULAR | Status: AC
  Administered 2019-03-18: 2 mg via INTRAVENOUS
  Filled 2019-03-18: qty 1

## 2019-03-18 MED ORDER — DEXTROSE-NACL 5-0.45 % IV SOLN: INTRAVENOUS | Status: DC

## 2019-03-18 MED ORDER — DIPHENHYDRAMINE HCL 25 MG PO CAPS
25.0000 mg | ORAL_CAPSULE | ORAL | Status: DC | PRN
  Administered 2019-03-18: 25 mg via ORAL
  Filled 2019-03-18: qty 1

## 2019-03-18 NOTE — ED Provider Notes (Signed)
[facility] DEPT Provider Note   CSN: [id number] Arrival date & time: 03/18/19  1137     History Chief Complaint  Patient presents with  . Sickle Cell Pain Crisis    Timothy Marks is a 29 y.o. male with a past medical history significant for sickle cell anemia, depression, chronic pain, and thrombocytopenia who presents to the ED due to complaints of a sickle cell pain crisis that started yesterday. Patient admits to 8/10 bilateral low back pain and achy lower extremities which he notes is typical of his sickle cell pain crisis. He admits to taking Tylenol and Ibuprofen with no relief. Patient notes he is currently out of his Percocet and Oxycontin for the past few days. He is normally on Percocet q 6 hours and oxycontin 20mg  BID. He states he was given the prescription to refill these medications, but awaiting insurance approval. Patient denies numbness, tingling, lower extremity weakness, saddle paresthesias, bowel/bladder incontinence, fever, chills, and IV drug use.  Patient denies chest pain and shortness of breath.  Denies previous acute chest syndrome.  Patient denies history of DVT/PE.  Denies recent illness, fever, chills.    Past Medical History:  Diagnosis Date  . Depression   . 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 Sickle cell anemia (HCC)   . Vitamin D deficiency y-6    Patient Active Problem List   Diagnosis Date Noted  . SIRS (systemic inflammatory response syndrome) ([place]) 02/10/2019  . Abnormal liver function 11/04/2018  . Sickle cell anemia with crisis ([place]) 05/22/2018  . Acute bilateral low back pain without sciatica   . Other chronic pain   . Anemia of chronic disease   . Thrombocythemia ([place])   . Physical deconditioning   . Agitation   . Chronic pain syndrome   . Recurrent cold sores   . Fever   . Pain   . Sickle cell crisis ([facility]) 07/16/2017  . Adjustment disorder  with mixed disturbance of emotions and conduct   . Constipation 08/03/2014  . 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 Protein-calorie malnutrition, severe ([place]) 04/09/2014  . Major depressive disorder, recurrent, severe without psychotic features ([place])   . 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 Vitamin D deficiency   . Sickle cell anemia ([place]) 03/30/2014  . Hyperbilirubinemia 03/30/2014  . Hypokalemia 02/07/2014  . Aggressive behavior 01/25/2013  . Sickle cell anemia with pain ([place]) 01/24/2013  . Sickle cell pain crisis ([place]) 01/24/2013    Past Surgical History:  Procedure Laterality Date  . CHOLECYSTECTOMY         Family History  Problem Relation Age of Onset  . Sickle cell trait Mother   . Sickle cell trait Father     Social History   Tobacco Use  . Smoking status: Never Smoker  . Smokeless tobacco: Never Used  Substance Use Topics  . Alcohol use: No  . Drug use: Not Currently    Types: Marijuana    Home Medications Prior to Admission medications   Medication Sig Start Date End Date Taking? Authorizing Provider  cholecalciferol (VITAMIN D) 1000 units tablet Take 2 tablets (2,000 Units total) by mouth daily. 12/13/17  Yes Azzie Glatter, FNP  folic acid (FOLVITE) 1 MG tablet Take 1 tablet (1 mg total) by mouth daily. 05/01/17  Yes Dorena Dew, FNP  gabapentin (NEURONTIN) 300 MG capsule Take 1 capsule (300 mg total) by mouth 3 (  three) times daily. 03/12/19  Yes Dorena Dew, FNP  hydroxyurea (HYDREA) 500 MG capsule Take 1 capsule (500 mg total) by mouth 2 (two) times daily. May take with food to minimize GI side effects. 05/01/17  Yes Dorena Dew, FNP  ibuprofen (ADVIL) 800 MG tablet Take 1 tablet (800 mg total) by mouth 3 (three) times daily as needed (pain). 03/12/19  Yes Dorena Dew, FNP  Nutritional Supplements (ENSURE ACTIVE HIGH PROTEIN) LIQD Take 1 each by mouth 3 (three) times daily between meals. #90 cans of Ensure  11/13/18  Yes Dorena Dew, FNP  oxyCODONE-acetaminophen (PERCOCET) 10-325 MG tablet Take 1 tablet by mouth every 6 (six) hours as needed for pain. 03/06/19 03/05/20 Yes Dorena Dew, FNP  OXYCONTIN 20 MG 12 hr tablet Take 1 tablet (20 mg total) by mouth every 12 (twelve) hours. 02/22/19  Yes Dorena Dew, FNP    Allergies    Buprenorphine hcl, Levaquin [levofloxacin], Meperidine, Morphine, Morphine and related, Toradol [ketorolac tromethamine], Tramadol, Vancomycin, and Zosyn [piperacillin sod-tazobactam so]  Review of Systems   Review of Systems  Constitutional: Negative for chills and fever.  Respiratory: Negative for cough and shortness of breath.   Cardiovascular: Negative for chest pain and leg swelling.  Gastrointestinal: Negative for abdominal pain, diarrhea, nausea and vomiting.  Genitourinary: Negative for difficulty urinating.  Musculoskeletal: Positive for back pain and myalgias.  Neurological: Negative for weakness and numbness.  All other systems reviewed and are negative.   Physical Exam Updated Vital Signs BP 114/79   Pulse 66   Temp 99 F (37.2 C) (Oral)   Resp 16   Ht 6\' 2"  (1.88 m)   Wt 65.8 kg   SpO2 98%   BMI 18.62 kg/m   Physical Exam Vitals and nursing note reviewed.  Constitutional:      General: He is not in acute distress.    Appearance: He is not toxic-appearing.  HENT:     Head: Normocephalic.  Eyes:     General: Scleral icterus present.  Cardiovascular:     Rate and Rhythm: Normal rate and regular rhythm.     Pulses: Normal pulses.     Heart sounds: Normal heart sounds. No murmur. No friction rub. No gallop.   Pulmonary:     Effort: Pulmonary effort is normal.     Breath sounds: Normal breath sounds.     Comments: Respirations equal and unlabored, patient able to speak in full sentences, lungs clear to auscultation bilaterally Abdominal:     General: Abdomen is flat. Bowel sounds are normal. There is no distension.      Palpations: Abdomen is soft.     Tenderness: There is no abdominal tenderness. There is no guarding or rebound.  Musculoskeletal:     Cervical back: Neck supple.     Comments: No T-spine and L-spine midline tenderness, no stepoff or deformity, reproducible bilateral lumbar paraspinal tenderness No leg edema bilaterally Patient moves all extremities without difficulty. DP/PT pulses 2+ and equal bilaterally Sensation grossly intact bilaterally Strength of knee flexion and extension is 5/5 Plantar and dorsiflexion of ankle 5/5 Negative homans sign bilaterally  Skin:    General: Skin is warm and dry.  Neurological:     General: No focal deficit present.     Mental Status: He is alert.  Psychiatric:        Mood and Affect: Mood normal.        Behavior: Behavior normal.     ED Results /  Procedures / Treatments   Labs (all labs ordered are listed, but only abnormal results are displayed) Labs Reviewed  COMPREHENSIVE METABOLIC PANEL - Abnormal; Notable for the following components:      Result Value   AST 45 (*)    Total Bilirubin 1.9 (*)    All other components within normal limits  CBC WITH DIFFERENTIAL/PLATELET - Abnormal; Notable for the following components:   Hemoglobin 12.3 (*)    HCT 35.5 (*)    RDW 20.0 (*)    nRBC 0.6 (*)    All other components within normal limits  RETICULOCYTES - Abnormal; Notable for the following components:   Retic Ct Pct 3.6 (*)    Immature Retic Fract 32.5 (*)    All other components within normal limits  URINALYSIS, ROUTINE W REFLEX MICROSCOPIC - Abnormal; Notable for the following components:   pH 9.0 (*)    All other components within normal limits    EKG None  Radiology No results found.  Procedures Procedures (including critical care time)  Medications Ordered in ED Medications  dextrose 5 %-0.45 % sodium chloride infusion ( Intravenous Stopped 03/18/19 1647)  diphenhydrAMINE (BENADRYL) capsule 25-50 mg (25 mg Oral Given 03/18/19  1332)  ondansetron (ZOFRAN) injection 4 mg (4 mg Intravenous Given 03/18/19 1332)  sodium chloride flush (NS) 0.9 % injection 3 mL (3 mLs Intravenous Given 03/18/19 1337)  HYDROmorphone (DILAUDID) injection 2 mg (2 mg Subcutaneous Given 03/18/19 1332)  HYDROmorphone (DILAUDID) injection 2 mg (2 mg Intravenous Given 03/18/19 1434)  HYDROmorphone (DILAUDID) injection 2 mg (2 mg Intravenous Given 03/18/19 1537)    ED Course  I have reviewed the triage vital signs and the nursing notes.  Pertinent labs & imaging results that were available during my care of the patient were reviewed by me and considered in my medical decision making (see chart for details).  Clinical Course as of Mar 17 1646  Mon Mar 18, 2019  1440 Reassessed patient at bedside. He was just given his 2nd dose. He notes his pain has improved slightly. He also notes he received a phone call that his prescription is ready for pick-up.   [id number] Reassessed patient after 3rd dose of pain medication. He notes his pain has significant improved and he is ready to go home.    [CA]    Clinical Course User Index [CA] Suzy Bouchard, PA-C   MDM Rules/Calculators/A&P                      29 year old male presents to the ED low back pain and lower extremity achiness consistent with prior sickle cell pain crises.  Patient denies history of acute chest syndrome and history of PE/DVT.  Vitals all within normal limits.  Patient in no acute distress and non-ill-appearing.  Back pain without red flags.  Patient denies trauma, IV drug use, bowel/bladder incontinence, saddle paresthesias, numbness, and tingling.  Doubt cauda equina or central cord compression.  Given there is no trauma, doubt fracture or subluxation.  Will treat as sickle cell pain crisis.  Patient denies fever, chest pain, shortness of breath.  Doubt acute chest syndrome, PE/DVT.  CBC reassuring with no leukocytosis.  Hemoglobin appears to be around baseline.  CMP reassuring with  normal renal function and no electrolyte abnormalities. UA negative for signs of infection. Reassessed patient numerous times. See notes above. Patient ready for discharge with significant pain improvement. During patient's ED stay, he was able to fill his  home pain prescriptions. Advised patient to take at home pain medication as prescribed. Strict ED precautions discussed with patient. Patient states understanding and agrees to plan. Patient discharged home in no acute distress and stable vitals  Final Clinical Impression(s) / ED Diagnoses Final diagnoses:  Sickle cell pain crisis [facility])    Rx / DC Orders ED Discharge Orders    None       Karie Kirks 03/18/19 1649    Maudie Flakes, MD 03/19/19 [phone]

## 2019-03-18 NOTE — Discharge Instructions (Signed)
As discussed, all of your labs looked good today. Continue to take your home medications as prescribed. Call your sickle cell doctor tomorrow if you are still experiencing pain. Return to the ER for new or worsening symptoms.

## 2019-03-18 NOTE — ED Triage Notes (Signed)
Patient c/o sickle cell pain in his lower back since yesterday.

## 2019-03-22 ENCOUNTER — Non-Acute Institutional Stay (HOSPITAL_BASED_OUTPATIENT_CLINIC_OR_DEPARTMENT_OTHER)
Admission: AD | Admit: 2019-03-22 | Discharge: 2019-03-22 | Disposition: A | Discharge status: Home / Self Care | Payer: Medicaid Other | Source: Ambulatory Visit | Attending: Internal Medicine | Admitting: Internal Medicine

## 2019-03-22 ENCOUNTER — Emergency Department (HOSPITAL_COMMUNITY)
Admission: EM | Admit: 2019-03-22 | Discharge: 2019-03-22 | Disposition: A | Discharge status: Home / Self Care | Payer: Medicaid Other | Attending: Emergency Medicine | Admitting: Emergency Medicine

## 2019-03-22 ENCOUNTER — Encounter (HOSPITAL_COMMUNITY): Payer: Self-pay | Admitting: Family Medicine

## 2019-03-22 ENCOUNTER — Encounter (HOSPITAL_COMMUNITY): Payer: Self-pay

## 2019-03-22 ENCOUNTER — Other Ambulatory Visit: Payer: Self-pay

## 2019-03-22 DIAGNOSIS — F913 Oppositional defiant disorder: Secondary | ICD-10-CM | POA: Insufficient documentation

## 2019-03-22 DIAGNOSIS — M79602 Pain in left arm: Secondary | ICD-10-CM | POA: Insufficient documentation

## 2019-03-22 DIAGNOSIS — D571 Sickle-cell disease without crisis: Secondary | ICD-10-CM | POA: Diagnosis not present

## 2019-03-22 DIAGNOSIS — Z79899 Other long term (current) drug therapy: Secondary | ICD-10-CM | POA: Diagnosis not present

## 2019-03-22 DIAGNOSIS — D638 Anemia in other chronic diseases classified elsewhere: Secondary | ICD-10-CM | POA: Insufficient documentation

## 2019-03-22 DIAGNOSIS — Z880 Allergy status to penicillin: Secondary | ICD-10-CM | POA: Insufficient documentation

## 2019-03-22 DIAGNOSIS — M5489 Other dorsalgia: Secondary | ICD-10-CM | POA: Insufficient documentation

## 2019-03-22 DIAGNOSIS — G894 Chronic pain syndrome: Secondary | ICD-10-CM | POA: Insufficient documentation

## 2019-03-22 DIAGNOSIS — Z885 Allergy status to narcotic agent status: Secondary | ICD-10-CM | POA: Insufficient documentation

## 2019-03-22 DIAGNOSIS — F1120 Opioid dependence, uncomplicated: Secondary | ICD-10-CM | POA: Insufficient documentation

## 2019-03-22 DIAGNOSIS — D5700 Hb-SS disease with crisis, unspecified: Secondary | ICD-10-CM

## 2019-03-22 DIAGNOSIS — F329 Major depressive disorder, single episode, unspecified: Secondary | ICD-10-CM | POA: Insufficient documentation

## 2019-03-22 LAB — COMPREHENSIVE METABOLIC PANEL
ALT: 15 U/L (ref 0–44)
AST: 26 U/L (ref 15–41)
Albumin: 4.5 g/dL (ref 3.5–5.0)
Alkaline Phosphatase: 69 U/L (ref 38–126)
Anion gap: 8 (ref 5–15)
BUN: 7 mg/dL (ref 6–20)
CO2: 28 mmol/L (ref 22–32)
Calcium: 9.3 mg/dL (ref 8.9–10.3)
Chloride: 102 mmol/L (ref 98–111)
Creatinine, Ser: 0.59 mg/dL — ABNORMAL LOW (ref 0.61–1.24)
GFR calc Af Amer: >60 mL/min (ref >60)
GFR calc non Af Amer: >60 mL/min (ref >60)
Glucose, Bld: 98 mg/dL (ref 70–99)
Potassium: 3.7 mmol/L (ref 3.5–5.1)
Sodium: 138 mmol/L (ref 135–145)
Total Bilirubin: 2.6 mg/dL — ABNORMAL HIGH (ref 0.3–1.2)
Total Protein: 7.9 g/dL (ref 6.5–8.1)

## 2019-03-22 LAB — RETICULOCYTES
Immature Retic Fract: 34 % — ABNORMAL HIGH (ref 2.3–15.9)
RBC.: 3.94 MIL/uL — ABNORMAL LOW (ref 4.22–5.81)
Retic Count, Absolute: 202.9 10*3/uL — ABNORMAL HIGH (ref 19.0–186.0)
Retic Ct Pct: 5.2 % — ABNORMAL HIGH (ref 0.4–3.1)

## 2019-03-22 LAB — CBC WITH DIFFERENTIAL/PLATELET
Abs Immature Granulocytes: 0.06 10*3/uL (ref 0.00–0.07)
Basophils Absolute: 0.1 10*3/uL (ref 0.0–0.1)
Basophils Relative: 1 %
Eosinophils Absolute: 0.4 10*3/uL (ref 0.0–0.5)
Eosinophils Relative: 4 %
HCT: 33 % — ABNORMAL LOW (ref 39.0–52.0)
Hemoglobin: 11.1 g/dL — ABNORMAL LOW (ref 13.0–17.0)
Immature Granulocytes: 1 %
Lymphocytes Relative: 29 %
Lymphs Abs: 2.9 10*3/uL (ref 0.7–4.0)
MCH: 27.7 pg (ref 26.0–34.0)
MCHC: 33.6 g/dL (ref 30.0–36.0)
MCV: 82.3 fL (ref 80.0–100.0)
Monocytes Absolute: 0.9 10*3/uL (ref 0.1–1.0)
Monocytes Relative: 9 %
Neutro Abs: 5.7 10*3/uL (ref 1.7–7.7)
Neutrophils Relative %: 56 %
Platelets: 412 10*3/uL — ABNORMAL HIGH (ref 150–400)
RBC: 4.01 MIL/uL — ABNORMAL LOW (ref 4.22–5.81)
RDW: 20.8 % — ABNORMAL HIGH (ref 11.5–15.5)
WBC: 10.1 10*3/uL (ref 4.0–10.5)
nRBC: 0.5 % — ABNORMAL HIGH (ref 0.0–0.2)

## 2019-03-22 MED ORDER — ONDANSETRON HCL 4 MG/2ML IJ SOLN
4.0000 mg | Freq: Four times a day (QID) | INTRAMUSCULAR | Status: DC | PRN
  Administered 2019-03-22: 15:00:00 4 mg via INTRAVENOUS
  Filled 2019-03-22: qty 2

## 2019-03-22 MED ORDER — SODIUM CHLORIDE 0.9% FLUSH: 9.0000 mL | INTRAVENOUS | Status: DC | PRN

## 2019-03-22 MED ORDER — HYDROMORPHONE HCL 2 MG/ML IJ SOLN
2.0000 mg | Freq: Once | INTRAMUSCULAR | Status: AC
  Administered 2019-03-22: 2 mg via INTRAVENOUS
  Filled 2019-03-22: qty 1

## 2019-03-22 MED ORDER — HYDROMORPHONE 1 MG/ML IV SOLN
INTRAVENOUS | Status: DC
  Administered 2019-03-22: 30 mg via INTRAVENOUS
  Administered 2019-03-22: 9.5 mg via INTRAVENOUS
  Filled 2019-03-22: qty 30

## 2019-03-22 MED ORDER — SODIUM CHLORIDE 0.9 % IV SOLN
25.0000 mg | INTRAVENOUS | Status: DC | PRN
  Filled 2019-03-22: qty 0.5

## 2019-03-22 MED ORDER — DEXTROSE-NACL 5-0.45 % IV SOLN: INTRAVENOUS | Status: DC

## 2019-03-22 MED ORDER — DIPHENHYDRAMINE HCL 25 MG PO CAPS: 25.0000 mg | ORAL_CAPSULE | ORAL | Status: DC | PRN

## 2019-03-22 MED ORDER — ACETAMINOPHEN 500 MG PO TABS
1000.0000 mg | ORAL_TABLET | Freq: Once | ORAL | Status: AC
  Administered 2019-03-22: 11:00:00 1000 mg via ORAL
  Filled 2019-03-22: qty 2

## 2019-03-22 MED ORDER — NALOXONE HCL 0.4 MG/ML IJ SOLN: 0.4000 mg | INTRAMUSCULAR | Status: DC | PRN

## 2019-03-22 NOTE — Discharge Instructions (Signed)
You will receive additional care at [facility].

## 2019-03-22 NOTE — Discharge Instructions (Signed)
Sickle Cell Anemia, Adult  Sickle cell anemia is a condition where your red blood cells are shaped like sickles. Red blood cells carry oxygen through the body. Sickle-shaped cells do not live as long as normal red blood cells. They also clump together and block blood from flowing through the blood vessels. This prevents the body from getting enough oxygen. Sickle cell anemia causes organ damage and pain. It also increases the risk of infection. Follow these instructions at home: Medicines  Take over-the-counter and prescription medicines only as told by your doctor.  If you were prescribed an antibiotic medicine, take it as told by your doctor. Do not stop taking the antibiotic even if you start to feel better.  If you develop a fever, do not take medicines to lower the fever right away. Tell your doctor about the fever. Managing pain, stiffness, and swelling  Try these methods to help with pain: ? Use a heating pad. ? Take a warm bath. ? Distract yourself, such as by watching TV. Eating and drinking  Drink enough fluid to keep your pee (urine) clear or pale yellow. Drink more in hot weather and during exercise.  Limit or avoid alcohol.  Eat a healthy diet. Eat plenty of fruits, vegetables, whole grains, and lean protein.  Take vitamins and supplements as told by your doctor. Traveling  When traveling, keep these with you: ? Your medical information. ? The names of your doctors. ? Your medicines.  If you need to take an airplane, talk to your doctor first. Activity  Rest often.  Avoid exercises that make your heart beat much faster, such as jogging. General instructions  Do not use products that have nicotine or tobacco, such as cigarettes and e-cigarettes. If you need help quitting, ask your doctor.  Consider wearing a medical alert bracelet.  Avoid being in high places (high altitudes), such as mountains.  Avoid very hot or cold temperatures.  Avoid places where the  temperature changes a lot.  Keep all follow-up visits as told by your doctor. This is important. Contact a doctor if:  A joint hurts.  Your feet or hands hurt or swell.  You feel tired (fatigued). Get help right away if:  You have symptoms of infection. These include: ? Fever. ? Chills. ? Being very tired. ? Irritability. ? Poor eating. ? Throwing up (vomiting).  You feel dizzy or faint.  You have new stomach pain, especially on the left side.  You have a an erection (priapism) that lasts more than 4 hours.  You have numbness in your arms or legs.  You have a hard time moving your arms or legs.  You have trouble talking.  You have pain that does not go away when you take medicine.  You are short of breath.  You are breathing fast.  You have a long-term cough.  You have pain in your chest.  You have a bad headache.  You have a stiff neck.  Your stomach looks bloated even though you did not eat much.  Your skin is pale.  You suddenly cannot see well. Summary  Sickle cell anemia is a condition where your red blood cells are shaped like sickles.  Follow your doctor's advice on ways to manage pain, food to eat, activities to do, and steps to take for safe travel.  Get medical help right away if you have any signs of infection, such as a fever. This information is not intended to replace advice given to you by   your health care provider. Make sure you discuss any questions you have with your health care provider. Document Revised: 05/25/2018 Document Reviewed: 03/08/2016 Elsevier Patient Education  2020 Elsevier Inc.  

## 2019-03-22 NOTE — Discharge Summary (Signed)
[facility] Discharge Summary   Patient ID: Timothy Marks MRN: [id number] DOB/AGE: January 25, 1991 29 y.o.  Admit date: 03/22/2019 Discharge date: 03/22/2019  Primary Care Physician:  Tresa Garter, MD  Admission Diagnoses:  Active Problems:   Sickle cell pain crisis [facility])   Discharge Medications:  Allergies as of 03/22/2019      Reactions   Buprenorphine Hcl Hives   Levaquin [levofloxacin] Itching   Meperidine Rash   Morphine Hives   Morphine And Related Hives   Toradol [ketorolac Tromethamine] Itching   Tramadol Hives   Vancomycin Itching   Zosyn [piperacillin Sod-tazobactam So] Itching, Rash   Has taken rocephin in past      Medication List    TAKE these medications   cholecalciferol 25 MCG (1000 UNIT) tablet Commonly known as: VITAMIN D Take 2 tablets (2,000 Units total) by mouth daily.   Ensure Active High Protein Liqd Take 1 each by mouth 3 (three) times daily between meals. #90 cans of Ensure   folic acid 1 MG tablet Commonly known as: FOLVITE Take 1 tablet (1 mg total) by mouth daily.   gabapentin 300 MG capsule Commonly known as: NEURONTIN Take 1 capsule (300 mg total) by mouth 3 (three) times daily.   hydroxyurea 500 MG capsule Commonly known as: HYDREA Take 1 capsule (500 mg total) by mouth 2 (two) times daily. May take with food to minimize GI side effects.   ibuprofen 800 MG tablet Commonly known as: ADVIL Take 1 tablet (800 mg total) by mouth 3 (three) times daily as needed (pain).   oxyCODONE-acetaminophen 10-325 MG tablet Commonly known as: Percocet Take 1 tablet by mouth every 6 (six) hours as needed for pain.   OxyCONTIN 20 mg 12 hr tablet Generic drug: oxyCODONE Take 1 tablet (20 mg total) by mouth every 12 (twelve) hours.        Consults:  None  Significant Diagnostic Studies:  No results found.  History of present illness: Timothy Marks, a 29 year old male with a medical history significant for sickle cell  disease type SS, chronic pain syndrome, opiate dependence and tolerance, history of anemia of chronic disease, and history of oppositional defiance disorder presents complaining of low back and left upper extremity pain that is consistent with his typical crisis.  Patient states the pain has been increased over the past several days.  He attributes current pain crisis to changes in weather.  Also, patient says that he is out of opiate medications.  He rates pain at 9/10 characterized as constant and throbbing.  Patient transitioned from the emergency department in stable condition.  Agreed with ER provider that patient was appropriate to transition to day infusion center for pain management and extended observation.  He denies fever, chills, headache, shortness of breath, chest pain, urinary symptoms, nausea, vomiting or diarrhea.  No sick contacts, recent travel, or exposure to COVID-19.  [facility] Course: Reviewed all laboratory values, consistent with patient's baseline.  Pain managed with IV Dilaudid PCA with settings of 0.5 mg, 10-minute lockout, and 3 mg/h. IV fluids, D5 0.45% saline at 100 mL/h Tylenol 1000 mg by mouth x1 Held Toradol due to listed allergy. Pain intensity decreased to 6-7/10.  Patient says that he is "ready to go home".  He will discharge from clinic in a hemodynamically stable condition.  Discharge instructions: Resume all home medications.   Follow up with PCP as previously  scheduled.   Discussed the importance of drinking 64 ounces of water  daily, dehydration of red blood cells may lead further sickling.   Avoid all stressors that precipitate sickle cell pain crisis.     The patient was given clear instructions to go to ER or return to medical center if symptoms do not improve, worsen or new problems develop.    Physical Exam at Discharge:  BP (!) 119/56 (BP Location: Left Arm)   Pulse 67   Temp (!) 97.5 F (36.4 C) (Oral)   Resp 10   SpO2 96%    Physical Exam Constitutional:      Appearance: Normal appearance.  HENT:     Head: Normocephalic.     Mouth/Throat:     Mouth: Mucous membranes are moist.  Eyes:     Pupils: Pupils are equal, round, and reactive to light.  Cardiovascular:     Pulses: Normal pulses.     Heart sounds: Normal heart sounds.  Pulmonary:     Effort: Pulmonary effort is normal.     Breath sounds: Normal breath sounds.  Abdominal:     General: Abdomen is flat. Bowel sounds are normal.  Musculoskeletal:        General: Normal range of motion.     Cervical back: Normal range of motion.  Skin:    General: Skin is warm.  Neurological:     General: No focal deficit present.     Mental Status: Mental status is at baseline.  Psychiatric:        Mood and Affect: Mood normal.        Behavior: Behavior normal.        Thought Content: Thought content normal.        Judgment: Judgment normal.      Disposition at Discharge: Discharge disposition: 01-Home or Self Care       Discharge Orders: Discharge Instructions    Discharge patient   Complete by: As directed    Discharge disposition: 01-Home or Self Care   Discharge patient date: 03/22/2019      Condition at Discharge:   Stable  Time spent on Discharge:  Greater than 30 minutes.  Signed: [name]  APRN, MSN, FNP-C [facility] [address] [place], [place] [address] [phone]  03/22/2019, 4:20 PM

## 2019-03-22 NOTE — ED Triage Notes (Signed)
Pt c/o sickle crisis, left arm and back pain since last night.

## 2019-03-22 NOTE — ED Provider Notes (Signed)
[facility] DEPT Provider Note   CSN: [id number] Arrival date & time: 03/22/19  [id number]     History Chief Complaint  Patient presents with  . Sickle Cell Pain Crisis  . Back Pain  . Arm Pain    Timothy Marks is a 29 y.o. male.  The history is provided by the patient and medical records. No language interpreter was used.  Sickle Cell Pain Crisis Back Pain Arm Pain     29 year old male with history of sickle cell disease, chronic pain syndrome, presenting complaining of sickle cell related pain.  Patient reports since last night he has developed pain to his back as well as his left arm.  Pain is sharp achy sensation, moderate in severity, felt similar to prior sickle cell crisis.  He attributed pain to weather changes.  He does not believe any fever chills productive cough shortness of breath nausea vomiting or diarrhea.  No Covid symptoms.  He has been compliant with his home medication.  Past Medical History:  Diagnosis Date  . Depression   . 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 Sickle cell anemia (HCC)   . Vitamin D deficiency y-6    Patient Active Problem List   Diagnosis Date Noted  . SIRS (systemic inflammatory response syndrome) ([place]) 02/10/2019  . Abnormal liver function 11/04/2018  . Sickle cell anemia with crisis ([place]) 05/22/2018  . Acute bilateral low back pain without sciatica   . Other chronic pain   . Anemia of chronic disease   . Thrombocythemia ([place])   . Physical deconditioning   . Agitation   . Chronic pain syndrome   . Recurrent cold sores   . Fever   . Pain   . Sickle cell crisis ([place]) 07/16/2017  . Adjustment disorder with mixed disturbance of emotions and conduct   . Constipation 08/03/2014  . 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 Protein-calorie malnutrition, severe ([place]) 04/09/2014  . Major depressive disorder, recurrent, severe without psychotic features ([place])     . 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 Vitamin D deficiency   . Sickle cell anemia ([place]) 03/30/2014  . Hyperbilirubinemia 03/30/2014  . Hypokalemia 02/07/2014  . Aggressive behavior 01/25/2013  . Sickle cell anemia with pain ([place]) 01/24/2013  . Sickle cell pain crisis ([place]) 01/24/2013    Past Surgical History:  Procedure Laterality Date  . CHOLECYSTECTOMY         Family History  Problem Relation Age of Onset  . Sickle cell trait Mother   . Sickle cell trait Father     Social History   Tobacco Use  . Smoking status: Never Smoker  . Smokeless tobacco: Never Used  Substance Use Topics  . Alcohol use: No  . Drug use: Not Currently    Types: Marijuana    Home Medications Prior to Admission medications   Medication Sig Start Date End Date Taking? Authorizing Provider  cholecalciferol (VITAMIN D) 1000 units tablet Take 2 tablets (2,000 Units total) by mouth daily. 12/13/17   Azzie Glatter, FNP  folic acid (FOLVITE) 1 MG tablet Take 1 tablet (1 mg total) by mouth daily. 05/01/17   Dorena Dew, FNP  gabapentin (NEURONTIN) 300 MG capsule Take 1 capsule (300 mg total) by mouth 3 (three) times daily. 03/12/19   Dorena Dew, FNP  hydroxyurea (HYDREA) 500 MG capsule Take 1 capsule (500 mg total) by mouth 2 (two)  times daily. May take with food to minimize GI side effects. 05/01/17   Dorena Dew, FNP  ibuprofen (ADVIL) 800 MG tablet Take 1 tablet (800 mg total) by mouth 3 (three) times daily as needed (pain). 03/12/19   Dorena Dew, FNP  Nutritional Supplements (ENSURE ACTIVE HIGH PROTEIN) LIQD Take 1 each by mouth 3 (three) times daily between meals. #90 cans of Ensure 11/13/18   Dorena Dew, FNP  oxyCODONE-acetaminophen (PERCOCET) 10-325 MG tablet Take 1 tablet by mouth every 6 (six) hours as needed for pain. 03/06/19 03/05/20  Dorena Dew, FNP  OXYCONTIN 20 MG 12 hr tablet Take 1 tablet (20 mg total) by mouth every  12 (twelve) hours. 02/22/19   Dorena Dew, FNP    Allergies    Buprenorphine hcl, Levaquin [levofloxacin], Meperidine, Morphine, Morphine and related, Toradol [ketorolac tromethamine], Tramadol, Vancomycin, and Zosyn [piperacillin sod-tazobactam so]  Review of Systems   Review of Systems  Musculoskeletal: Positive for back pain.  All other systems reviewed and are negative.   Physical Exam Updated Vital Signs BP 121/76   Pulse 71   Temp 98.1 F (36.7 C) (Oral)   Resp 16   SpO2 100%   Physical Exam Vitals and nursing note reviewed.  Constitutional:      General: He is not in acute distress.    Appearance: He is well-developed.     Comments: Resting comfortably in bed playing on his phone.  Appears to be in no acute discomfort.  HENT:     Head: Atraumatic.  Eyes:     Conjunctiva/sclera: Conjunctivae normal.  Cardiovascular:     Rate and Rhythm: Normal rate and regular rhythm.     Pulses: Normal pulses.     Heart sounds: Normal heart sounds.  Pulmonary:     Effort: Pulmonary effort is normal.     Breath sounds: Normal breath sounds. No wheezing, rhonchi or rales.  Abdominal:     Palpations: Abdomen is soft.     Tenderness: There is no abdominal tenderness.  Musculoskeletal:        General: No deformity.     Cervical back: Neck supple.  Skin:    Capillary Refill: Capillary refill takes less than 2 seconds.     Findings: No rash.  Neurological:     Mental Status: He is alert and oriented to person, place, and time.  Psychiatric:        Mood and Affect: Mood normal.     ED Results / Procedures / Treatments   Labs (all labs ordered are listed, but only abnormal results are displayed) Labs Reviewed  CBC WITH DIFFERENTIAL/PLATELET - Abnormal; Notable for the following components:      Result Value   RBC 4.01 (*)    Hemoglobin 11.1 (*)    HCT 33.0 (*)    RDW 20.8 (*)    Platelets 412 (*)    All other components within normal limits  COMPREHENSIVE METABOLIC  PANEL  RETICULOCYTES    EKG None  Radiology No results found.  Procedures Procedures (including critical care time)  Medications Ordered in ED Medications  HYDROmorphone (DILAUDID) injection 2 mg (2 mg Intravenous Given 03/22/19 [id number])    ED Course  I have reviewed the triage vital signs and the nursing notes.  Pertinent labs & imaging results that were available during my care of the patient were reviewed by me and considered in my medical decision making (see chart for details).    MDM Rules/Calculators/A&P  BP 121/76   Pulse 71   Temp 98.1 F (36.7 C) (Oral)   Resp 16   SpO2 100%   Final Clinical Impression(s) / ED Diagnoses Final diagnoses:  Sickle cell anemia with pain ([place])    Rx / DC Orders ED Discharge Orders    None     7:52 AM Patient report pain to his back and left forearm that felt similar to prior sickle cell crisis brought on by weather changes.  States he has been compliant with his medication at home.  He is afebrile, vital signs stable, no hypoxia.  No COVID-19 symptoms.  No symptoms to suggest acute chest.  Will obtain screening labs and will reach out to sickle cell day Hospital for further management.  8:19 AM I have consulted with Sickle Cell specialist [facility] who agrees to accept pt to Day hospital but request initial dose of dilaudid to be given in the ER.     Domenic Moras, PA-C 03/22/19 [id number]    Lacretia Leigh, MD 03/23/19 1230

## 2019-03-22 NOTE — Progress Notes (Signed)
Patient admitted to the day hospital for treatment of sickle cell pain crisis. Patient reported pain rated 8/10 in the back. Patient placed on Dilaudid PCA, given PO Tylenol, IV Zofran and hydrated with IV fluids. At discharge patient reported  pain at 7/10. Discharge instructions given to patient. Patient alert, oriented and ambulatory at discharge.

## 2019-03-22 NOTE — H&P (Signed)
[facility] History and Physical   Date: 03/22/2019  Patient name: Timothy Marks Medical record number: [id number] Date of birth: [birth year] Age: 29 y.o. Gender: male PCP: Tresa Garter, MD  Attending physician: No att. providers found  Chief Complaint: Sickle cell disease  History of Present Illness: Timothy Marks, a 29 year old male with a medical history significant for sickle cell disease type SS, chronic pain syndrome, opiate dependence and tolerance, history of anemia of chronic disease, and history of oppositional defiance disorder presents complaining of low back pain and left upper extremity pain that is consistent with his typical crisis.  Patient states the pain is been increased over the past several days.  He attributes current pain crisis to changes in weather.  Also, patient says that he is out of opiate medications.  He rates pain as 9/10 characterized as constant and throbbing.  Patient transition from the emergency department this a.m. in stable condition.  Agreed with ER provider that patient was appropriate to transition to day infusion center for pain management and extended observation.  He denies fever, chills, headache, shortness of breath, chest pain, urinary symptoms, nausea, vomiting, or diarrhea.  No sick contacts, recent travel, or exposure to COVID-19.  Meds: No medications prior to admission.    Allergies: Buprenorphine hcl, Levaquin [levofloxacin], Meperidine, Morphine, Morphine and related, Toradol [ketorolac tromethamine], Tramadol, Vancomycin, and Zosyn [piperacillin sod-tazobactam so] Past Medical History:  Diagnosis Date  . Depression   . 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 Sickle cell anemia (HCC)   . Vitamin D deficiency y-6   Past Surgical History:  Procedure Laterality Date  . CHOLECYSTECTOMY     Family History  Problem Relation Age of Onset  . Sickle cell  trait Mother   . Sickle cell trait Father    Social History   Socioeconomic History  . Marital status: Single    Spouse name: Not on file  . Number of children: Not on file  . Years of education: Not on file  . Highest education level: Not on file  Occupational History  . Not on file  Tobacco Use  . Smoking status: Never Smoker  . Smokeless tobacco: Never Used  Substance and Sexual Activity  . Alcohol use: No  . Drug use: Not Currently    Types: Marijuana  . Sexual activity: Not on file  Other Topics Concern  . Not on file  Social History Narrative  . Not on file   Social Determinants of Health   Financial Resource Strain:   . Difficulty of Paying Living Expenses: Not on file  Food Insecurity:   . Worried About Charity fundraiser in the Last Year: Not on file  . Ran Out of Food in the Last Year: Not on file  Transportation Needs:   . Lack of Transportation (Medical): Not on file  . Lack of Transportation (Non-Medical): Not on file  Physical Activity:   . Days of Exercise per Week: Not on file  . Minutes of Exercise per Session: Not on file  Stress:   . Feeling of Stress : Not on file  Social Connections:   . Frequency of Communication with Friends and Family: Not on file  . Frequency of Social Gatherings with Friends and Family: Not on file  . Attends Religious Services: Not on file  . Active Member of Clubs or Organizations: Not on file  . Attends Archivist Meetings:  Not on file  . Marital Status: Not on file  Intimate Partner Violence:   . Fear of Current or Ex-Partner: Not on file  . Emotionally Abused: Not on file  . Physically Abused: Not on file  . Sexually Abused: Not on file   Review of Systems  Constitutional: Negative for chills and fever.  HENT: Negative.   Eyes: Negative.   Respiratory: Negative.   Cardiovascular: Negative.   Gastrointestinal: Negative.   Genitourinary: Negative.   Musculoskeletal: Positive for back pain and joint  pain.  Skin: Negative.   Neurological: Negative.   Psychiatric/Behavioral: Negative.     Physical Exam: Blood pressure (!) 119/56, pulse 67, temperature (!) 97.5 F (36.4 C), temperature source Oral, resp. rate 10, SpO2 96 %. Physical Exam Constitutional:      Appearance: Normal appearance.  HENT:     Mouth/Throat:     Mouth: Mucous membranes are moist.     Pharynx: Oropharynx is clear.  Eyes:     Pupils: Pupils are equal, round, and reactive to light.  Cardiovascular:     Rate and Rhythm: Normal rate and regular rhythm.  Pulmonary:     Effort: Pulmonary effort is normal.  Abdominal:     General: Bowel sounds are normal.  Musculoskeletal:        General: Normal range of motion.  Skin:    General: Skin is warm.  Neurological:     General: No focal deficit present.     Mental Status: He is alert. Mental status is at baseline.  Psychiatric:        Mood and Affect: Mood normal.        Behavior: Behavior normal.        Thought Content: Thought content normal.        Judgment: Judgment normal.      Lab results: Results for orders placed or performed during the hospital encounter of 03/22/19 (from the past 24 hour(s))  Comprehensive metabolic panel     Status: Abnormal   Collection Time: 03/22/19  7:51 AM  Result Value Ref Range   Sodium 138 135 - 145 mmol/L   Potassium 3.7 3.5 - 5.1 mmol/L   Chloride 102 98 - 111 mmol/L   CO2 28 22 - 32 mmol/L   Glucose, Bld 98 70 - 99 mg/dL   BUN 7 6 - 20 mg/dL   Creatinine, Ser 0.59 (L) 0.61 - 1.24 mg/dL   Calcium 9.3 8.9 - 10.3 mg/dL   Total Protein 7.9 6.5 - 8.1 g/dL   Albumin 4.5 3.5 - 5.0 g/dL   AST 26 15 - 41 U/L   ALT 15 0 - 44 U/L   Alkaline Phosphatase 69 38 - 126 U/L   Total Bilirubin 2.6 (H) 0.3 - 1.2 mg/dL   GFR calc non Af Amer >60 >60 mL/min   GFR calc Af Amer >60 >60 mL/min   Anion gap 8 5 - 15  CBC with Differential     Status: Abnormal   Collection Time: 03/22/19  7:51 AM  Result Value Ref Range   WBC 10.1  4.0 - 10.5 K/uL   RBC 4.01 (L) 4.22 - 5.81 MIL/uL   Hemoglobin 11.1 (L) 13.0 - 17.0 g/dL   HCT 33.0 (L) 39.0 - 52.0 %   MCV 82.3 80.0 - 100.0 fL   MCH 27.7 26.0 - 34.0 pg   MCHC 33.6 30.0 - 36.0 g/dL   RDW 20.8 (H) 11.5 - 15.5 %   Platelets 412 (H)  150 - 400 K/uL   nRBC 0.5 (H) 0.0 - 0.2 %   Neutrophils Relative % 56 %   Neutro Abs 5.7 1.7 - 7.7 K/uL   Lymphocytes Relative 29 %   Lymphs Abs 2.9 0.7 - 4.0 K/uL   Monocytes Relative 9 %   Monocytes Absolute 0.9 0.1 - 1.0 K/uL   Eosinophils Relative 4 %   Eosinophils Absolute 0.4 0.0 - 0.5 K/uL   Basophils Relative 1 %   Basophils Absolute 0.1 0.0 - 0.1 K/uL   Immature Granulocytes 1 %   Abs Immature Granulocytes 0.06 0.00 - 0.07 K/uL   Polychromasia PRESENT    Sickle Cells PRESENT    Target Cells PRESENT   Reticulocytes     Status: Abnormal   Collection Time: 03/22/19  7:51 AM  Result Value Ref Range   Retic Ct Pct 5.2 (H) 0.4 - 3.1 %   RBC. 3.94 (L) 4.22 - 5.81 MIL/uL   Retic Count, Absolute 202.9 (H) 19.0 - 186.0 K/uL   Immature Retic Fract 34.0 (H) 2.3 - 15.9 %    Imaging results:  No results found.   Assessment & Plan: Patient admitted to sickle cell day infusion center for management of pain crisis.  Patient is opiate tolerant Initiate IV dilaudid PCA. Settings of 0.5 mg, 10 minute lockout, and 3 mg per hour IV fluids, D5.45% saline at 100 ml/hr Hold IV Toradol due to allergy Tylenol 1000 mg by mouth times one dose Review CBC with differential, complete metabolic panel, and reticulocytes as results become available. Baseline hemoglobin is Pain intensity will be reevaluated in context of functioning and relationship to baseline as care progress If pain intensity remains elevated and/or sudden change in hemodynamic stability transition to inpatient services for higher level of care.       [name]  APRN, MSN, FNP-C [facility] [address] [place], [place]  [address] [phone]  03/22/2019, 4:26 PM

## 2019-03-23 ENCOUNTER — Inpatient Hospital Stay (HOSPITAL_COMMUNITY)
Admission: EM | Admit: 2019-03-23 | Discharge: 2019-03-27 | DRG: 812 | Disposition: A | Discharge status: Home / Self Care | Payer: Medicaid Other | Attending: Internal Medicine | Admitting: Internal Medicine

## 2019-03-23 ENCOUNTER — Other Ambulatory Visit: Payer: Self-pay

## 2019-03-23 ENCOUNTER — Emergency Department (HOSPITAL_COMMUNITY)
Admission: EM | Admit: 2019-03-23 | Discharge: 2019-03-23 | Disposition: A | Discharge status: Home / Self Care | Payer: Medicaid Other | Source: Home / Self Care | Attending: Emergency Medicine | Admitting: Emergency Medicine

## 2019-03-23 ENCOUNTER — Encounter (HOSPITAL_COMMUNITY): Payer: Self-pay

## 2019-03-23 ENCOUNTER — Encounter (HOSPITAL_COMMUNITY): Payer: Self-pay | Admitting: Emergency Medicine

## 2019-03-23 DIAGNOSIS — D5700 Hb-SS disease with crisis, unspecified: Secondary | ICD-10-CM

## 2019-03-23 DIAGNOSIS — Z880 Allergy status to penicillin: Secondary | ICD-10-CM

## 2019-03-23 DIAGNOSIS — Z79899 Other long term (current) drug therapy: Secondary | ICD-10-CM | POA: Insufficient documentation

## 2019-03-23 DIAGNOSIS — F1120 Opioid dependence, uncomplicated: Secondary | ICD-10-CM | POA: Diagnosis present

## 2019-03-23 DIAGNOSIS — Z885 Allergy status to narcotic agent status: Secondary | ICD-10-CM

## 2019-03-23 DIAGNOSIS — F913 Oppositional defiant disorder: Secondary | ICD-10-CM | POA: Diagnosis present

## 2019-03-23 DIAGNOSIS — D638 Anemia in other chronic diseases classified elsewhere: Secondary | ICD-10-CM | POA: Diagnosis present

## 2019-03-23 DIAGNOSIS — D57219 Sickle-cell/Hb-C disease with crisis, unspecified: Secondary | ICD-10-CM | POA: Insufficient documentation

## 2019-03-23 DIAGNOSIS — Z888 Allergy status to other drugs, medicaments and biological substances status: Secondary | ICD-10-CM

## 2019-03-23 DIAGNOSIS — G8929 Other chronic pain: Secondary | ICD-10-CM | POA: Diagnosis present

## 2019-03-23 DIAGNOSIS — Z20822 Contact with and (suspected) exposure to covid-19: Secondary | ICD-10-CM | POA: Diagnosis present

## 2019-03-23 DIAGNOSIS — F329 Major depressive disorder, single episode, unspecified: Secondary | ICD-10-CM | POA: Diagnosis present

## 2019-03-23 DIAGNOSIS — Z832 Family history of diseases of the blood and blood-forming organs and certain disorders involving the immune mechanism: Secondary | ICD-10-CM

## 2019-03-23 DIAGNOSIS — D649 Anemia, unspecified: Secondary | ICD-10-CM | POA: Diagnosis present

## 2019-03-23 DIAGNOSIS — F4325 Adjustment disorder with mixed disturbance of emotions and conduct: Secondary | ICD-10-CM

## 2019-03-23 DIAGNOSIS — G894 Chronic pain syndrome: Secondary | ICD-10-CM | POA: Diagnosis present

## 2019-03-23 DIAGNOSIS — Z79891 Long term (current) use of opiate analgesic: Secondary | ICD-10-CM

## 2019-03-23 LAB — COMPREHENSIVE METABOLIC PANEL
ALT: 13 U/L (ref 0–44)
ALT: 13 U/L (ref 0–44)
AST: 25 U/L (ref 15–41)
AST: 31 U/L (ref 15–41)
Albumin: 4.3 g/dL (ref 3.5–5.0)
Albumin: 4.1 g/dL (ref 3.5–5.0)
Alkaline Phosphatase: 70 U/L (ref 38–126)
Alkaline Phosphatase: 69 U/L (ref 38–126)
Anion gap: 14 (ref 5–15)
Anion gap: 7 (ref 5–15)
BUN: 7 mg/dL (ref 6–20)
BUN: 6 mg/dL (ref 6–20)
CO2: 26 mmol/L (ref 22–32)
CO2: 27 mmol/L (ref 22–32)
Calcium: 8.4 mg/dL — ABNORMAL LOW (ref 8.9–10.3)
Calcium: 8.8 mg/dL — ABNORMAL LOW (ref 8.9–10.3)
Chloride: 98 mmol/L (ref 98–111)
Chloride: 104 mmol/L (ref 98–111)
Creatinine, Ser: 0.6 mg/dL — ABNORMAL LOW (ref 0.61–1.24)
Creatinine, Ser: 0.59 mg/dL — ABNORMAL LOW (ref 0.61–1.24)
GFR calc Af Amer: >60 mL/min (ref >60)
GFR calc Af Amer: >60 mL/min (ref >60)
GFR calc non Af Amer: >60 mL/min (ref >60)
GFR calc non Af Amer: >60 mL/min (ref >60)
Glucose, Bld: 97 mg/dL (ref 70–99)
Glucose, Bld: 107 mg/dL — ABNORMAL HIGH (ref 70–99)
Potassium: 3.5 mmol/L (ref 3.5–5.1)
Potassium: 4.2 mmol/L (ref 3.5–5.1)
Sodium: 138 mmol/L (ref 135–145)
Sodium: 138 mmol/L (ref 135–145)
Total Bilirubin: 3.7 mg/dL — ABNORMAL HIGH (ref 0.3–1.2)
Total Bilirubin: 2.2 mg/dL — ABNORMAL HIGH (ref 0.3–1.2)
Total Protein: 7.8 g/dL (ref 6.5–8.1)
Total Protein: 7.7 g/dL (ref 6.5–8.1)

## 2019-03-23 LAB — CBC WITH DIFFERENTIAL/PLATELET
Abs Immature Granulocytes: 0.04 10*3/uL (ref 0.00–0.07)
Abs Immature Granulocytes: 0.09 10*3/uL — ABNORMAL HIGH (ref 0.00–0.07)
Basophils Absolute: 0 10*3/uL (ref 0.0–0.1)
Basophils Absolute: 0 10*3/uL (ref 0.0–0.1)
Basophils Relative: 0 %
Basophils Relative: 0 %
Eosinophils Absolute: 0.3 10*3/uL (ref 0.0–0.5)
Eosinophils Absolute: 0.3 10*3/uL (ref 0.0–0.5)
Eosinophils Relative: 2 %
Eosinophils Relative: 2 %
HCT: 31.2 % — ABNORMAL LOW (ref 39.0–52.0)
HCT: 29.9 % — ABNORMAL LOW (ref 39.0–52.0)
Hemoglobin: 10.7 g/dL — ABNORMAL LOW (ref 13.0–17.0)
Hemoglobin: 10.3 g/dL — ABNORMAL LOW (ref 13.0–17.0)
Immature Granulocytes: 0 %
Immature Granulocytes: 1 %
Lymphocytes Relative: 14 %
Lymphocytes Relative: 11 %
Lymphs Abs: 2 10*3/uL (ref 0.7–4.0)
Lymphs Abs: 1.6 10*3/uL (ref 0.7–4.0)
MCH: 28 pg (ref 26.0–34.0)
MCH: 28.2 pg (ref 26.0–34.0)
MCHC: 34.3 g/dL (ref 30.0–36.0)
MCHC: 34.4 g/dL (ref 30.0–36.0)
MCV: 81.7 fL (ref 80.0–100.0)
MCV: 81.9 fL (ref 80.0–100.0)
Monocytes Absolute: 1.1 10*3/uL — ABNORMAL HIGH (ref 0.1–1.0)
Monocytes Absolute: 1.2 10*3/uL — ABNORMAL HIGH (ref 0.1–1.0)
Monocytes Relative: 8 %
Monocytes Relative: 8 %
Neutro Abs: 10.9 10*3/uL — ABNORMAL HIGH (ref 1.7–7.7)
Neutro Abs: 11.5 10*3/uL — ABNORMAL HIGH (ref 1.7–7.7)
Neutrophils Relative %: 76 %
Neutrophils Relative %: 78 %
Platelets: 387 10*3/uL (ref 150–400)
Platelets: 383 10*3/uL (ref 150–400)
RBC: 3.82 MIL/uL — ABNORMAL LOW (ref 4.22–5.81)
RBC: 3.65 MIL/uL — ABNORMAL LOW (ref 4.22–5.81)
RDW: 21 % — ABNORMAL HIGH (ref 11.5–15.5)
RDW: 20.8 % — ABNORMAL HIGH (ref 11.5–15.5)
WBC: 14.4 10*3/uL — ABNORMAL HIGH (ref 4.0–10.5)
WBC: 14.6 10*3/uL — ABNORMAL HIGH (ref 4.0–10.5)
nRBC: 0.6 % — ABNORMAL HIGH (ref 0.0–0.2)
nRBC: 0.6 % — ABNORMAL HIGH (ref 0.0–0.2)

## 2019-03-23 LAB — RETICULOCYTES
Immature Retic Fract: 40.5 % — ABNORMAL HIGH (ref 2.3–15.9)
RBC.: 3.79 MIL/uL — ABNORMAL LOW (ref 4.22–5.81)
Retic Count, Absolute: 197.5 10*3/uL — ABNORMAL HIGH (ref 19.0–186.0)
Retic Ct Pct: 5.2 % — ABNORMAL HIGH (ref 0.4–3.1)

## 2019-03-23 LAB — RESPIRATORY PANEL BY RT PCR (FLU A&B, COVID)
Influenza A by PCR: NEGATIVE
Influenza B by PCR: NEGATIVE
SARS Coronavirus 2 by RT PCR: NEGATIVE

## 2019-03-23 MED ORDER — HYDROMORPHONE 1 MG/ML IV SOLN
INTRAVENOUS | Status: DC
  Administered 2019-03-23: 30 mg via INTRAVENOUS
  Administered 2019-03-24: 3.2 mg via INTRAVENOUS
  Administered 2019-03-24: 3.9 mg via INTRAVENOUS
  Filled 2019-03-23: qty 30

## 2019-03-23 MED ORDER — DIPHENHYDRAMINE HCL 25 MG PO CAPS
25.0000 mg | ORAL_CAPSULE | Freq: Four times a day (QID) | ORAL | Status: DC | PRN
  Filled 2019-03-23: qty 1

## 2019-03-23 MED ORDER — SODIUM CHLORIDE 0.9% FLUSH: 3.0000 mL | Freq: Once | INTRAVENOUS | Status: DC

## 2019-03-23 MED ORDER — VITAMIN D3 25 MCG (1000 UNIT) PO TABS
2000.0000 [IU] | ORAL_TABLET | Freq: Every day | ORAL | Status: DC
  Administered 2019-03-24 – 2019-03-27 (×4): 2000 [IU] via ORAL
  Filled 2019-03-23 (×4): qty 2

## 2019-03-23 MED ORDER — OXYCODONE-ACETAMINOPHEN 10-325 MG PO TABS: 1.0000 | ORAL_TABLET | Freq: Four times a day (QID) | ORAL | Status: DC | PRN

## 2019-03-23 MED ORDER — FOLIC ACID 1 MG PO TABS
1.0000 mg | ORAL_TABLET | Freq: Every day | ORAL | Status: DC
  Administered 2019-03-24 – 2019-03-27 (×4): 1 mg via ORAL
  Filled 2019-03-23 (×4): qty 1

## 2019-03-23 MED ORDER — OXYCODONE-ACETAMINOPHEN 5-325 MG PO TABS
1.0000 | ORAL_TABLET | Freq: Four times a day (QID) | ORAL | Status: DC | PRN
  Administered 2019-03-24 – 2019-03-26 (×7): 1 via ORAL
  Filled 2019-03-23 (×7): qty 1

## 2019-03-23 MED ORDER — SODIUM CHLORIDE 0.9% FLUSH: 9.0000 mL | INTRAVENOUS | Status: DC | PRN

## 2019-03-23 MED ORDER — ONDANSETRON HCL 4 MG/2ML IJ SOLN
4.0000 mg | INTRAMUSCULAR | Status: DC | PRN
  Administered 2019-03-25 – 2019-03-26 (×3): 4 mg via INTRAVENOUS
  Filled 2019-03-23 (×3): qty 2

## 2019-03-23 MED ORDER — DIPHENHYDRAMINE HCL 12.5 MG/5ML PO ELIX: 12.5000 mg | ORAL_SOLUTION | Freq: Four times a day (QID) | ORAL | Status: DC | PRN

## 2019-03-23 MED ORDER — ONDANSETRON 4 MG PO TBDP
4.0000 mg | ORAL_TABLET | Freq: Once | ORAL | Status: AC
  Administered 2019-03-23: 4 mg via ORAL
  Filled 2019-03-23: qty 1

## 2019-03-23 MED ORDER — ALUM & MAG HYDROXIDE-SIMETH 200-200-20 MG/5ML PO SUSP: 15.0000 mL | ORAL | Status: DC | PRN

## 2019-03-23 MED ORDER — HYDROMORPHONE HCL 2 MG/ML IJ SOLN
2.0000 mg | INTRAMUSCULAR | Status: AC
  Administered 2019-03-23: 2 mg via SUBCUTANEOUS
  Filled 2019-03-23: qty 1

## 2019-03-23 MED ORDER — OXYCODONE HCL 5 MG PO TABS
5.0000 mg | ORAL_TABLET | Freq: Four times a day (QID) | ORAL | Status: DC | PRN
  Administered 2019-03-24 – 2019-03-26 (×4): 5 mg via ORAL
  Filled 2019-03-23 (×6): qty 1

## 2019-03-23 MED ORDER — ONDANSETRON HCL 4 MG/2ML IJ SOLN: 4.0000 mg | Freq: Four times a day (QID) | INTRAMUSCULAR | Status: DC | PRN

## 2019-03-23 MED ORDER — IBUPROFEN 800 MG PO TABS
800.0000 mg | ORAL_TABLET | Freq: Three times a day (TID) | ORAL | Status: DC | PRN
  Administered 2019-03-25: 800 mg via ORAL
  Filled 2019-03-23: qty 1

## 2019-03-23 MED ORDER — ONDANSETRON HCL 4 MG PO TABS: 4.0000 mg | ORAL_TABLET | ORAL | Status: DC | PRN

## 2019-03-23 MED ORDER — DIPHENHYDRAMINE HCL 25 MG PO CAPS: 25.0000 mg | ORAL_CAPSULE | ORAL | Status: DC | PRN

## 2019-03-23 MED ORDER — NALOXONE HCL 0.4 MG/ML IJ SOLN: 0.4000 mg | INTRAMUSCULAR | Status: DC | PRN

## 2019-03-23 MED ORDER — SODIUM CHLORIDE 0.9 % IV SOLN
25.0000 mg | INTRAVENOUS | Status: DC | PRN
  Filled 2019-03-23: qty 0.5

## 2019-03-23 MED ORDER — DIPHENHYDRAMINE HCL 25 MG PO CAPS
25.0000 mg | ORAL_CAPSULE | ORAL | Status: DC | PRN
  Administered 2019-03-23: 25 mg via ORAL
  Filled 2019-03-23: qty 1

## 2019-03-23 MED ORDER — ONDANSETRON 4 MG PO TBDP: 8.0000 mg | ORAL_TABLET | Freq: Three times a day (TID) | ORAL | Status: DC | PRN

## 2019-03-23 MED ORDER — HYDROMORPHONE HCL 2 MG/ML IJ SOLN
1.5000 mg | Freq: Once | INTRAMUSCULAR | Status: AC
  Administered 2019-03-23: 1.5 mg via SUBCUTANEOUS
  Filled 2019-03-23: qty 1

## 2019-03-23 MED ORDER — HYDROMORPHONE HCL 1 MG/ML IJ SOLN: 1.0000 mg | INTRAMUSCULAR | Status: DC | PRN

## 2019-03-23 MED ORDER — POLYETHYLENE GLYCOL 3350 17 G PO PACK: 17.0000 g | PACK | Freq: Every day | ORAL | Status: DC | PRN

## 2019-03-23 MED ORDER — SODIUM CHLORIDE 0.45 % IV SOLN: INTRAVENOUS | Status: DC

## 2019-03-23 MED ORDER — HYDROXYUREA 500 MG PO CAPS
500.0000 mg | ORAL_CAPSULE | Freq: Two times a day (BID) | ORAL | Status: DC
  Administered 2019-03-24 – 2019-03-27 (×8): 500 mg via ORAL
  Filled 2019-03-23 (×8): qty 1

## 2019-03-23 MED ORDER — HYDROMORPHONE HCL 2 MG/ML IJ SOLN: 2.0000 mg | Freq: Once | INTRAMUSCULAR | Status: DC

## 2019-03-23 MED ORDER — SODIUM CHLORIDE 0.9 % IV SOLN
INTRAVENOUS | Status: DC
  Administered 2019-03-23: 10 mL/h via INTRAVENOUS

## 2019-03-23 MED ORDER — SENNOSIDES-DOCUSATE SODIUM 8.6-50 MG PO TABS
1.0000 | ORAL_TABLET | Freq: Two times a day (BID) | ORAL | Status: DC
  Administered 2019-03-24 – 2019-03-27 (×5): 1 via ORAL
  Filled 2019-03-23 (×8): qty 1

## 2019-03-23 MED ORDER — OXYCODONE HCL ER 10 MG PO T12A
20.0000 mg | EXTENDED_RELEASE_TABLET | Freq: Two times a day (BID) | ORAL | Status: DC
  Administered 2019-03-23: 20 mg via ORAL
  Filled 2019-03-23: qty 2

## 2019-03-23 MED ORDER — HYDROMORPHONE HCL 2 MG/ML IJ SOLN
2.0000 mg | Freq: Once | INTRAMUSCULAR | Status: AC
  Administered 2019-03-23: 2 mg via SUBCUTANEOUS
  Filled 2019-03-23: qty 1

## 2019-03-23 MED ORDER — DIPHENHYDRAMINE HCL 50 MG/ML IJ SOLN: 12.5000 mg | Freq: Four times a day (QID) | INTRAMUSCULAR | Status: DC | PRN

## 2019-03-23 MED ORDER — HYDROMORPHONE HCL 1 MG/ML IJ SOLN
0.5000 mg | Freq: Once | INTRAMUSCULAR | Status: AC
  Administered 2019-03-23: 0.5 mg via SUBCUTANEOUS
  Filled 2019-03-23: qty 1

## 2019-03-23 MED ORDER — HYDROMORPHONE HCL 2 MG/ML IJ SOLN
2.0000 mg | Freq: Once | INTRAMUSCULAR | Status: AC
  Administered 2019-03-23: 21:00:00 2 mg via SUBCUTANEOUS
  Filled 2019-03-23: qty 1

## 2019-03-23 MED ORDER — OXYCODONE HCL ER 10 MG PO T12A
20.0000 mg | EXTENDED_RELEASE_TABLET | Freq: Two times a day (BID) | ORAL | Status: DC
  Administered 2019-03-24 – 2019-03-27 (×8): 20 mg via ORAL
  Filled 2019-03-23 (×8): qty 2

## 2019-03-23 MED ORDER — GABAPENTIN 300 MG PO CAPS
300.0000 mg | ORAL_CAPSULE | Freq: Three times a day (TID) | ORAL | Status: DC
  Administered 2019-03-24 – 2019-03-27 (×10): 300 mg via ORAL
  Filled 2019-03-23 (×10): qty 1

## 2019-03-23 NOTE — ED Provider Notes (Signed)
Care transferred from previous provider Humes, PA-C at shift change. See note for full HPI.  In summation 29 year old male presents for evaluation of sickle cell crisis. Seen yesterday and dc home to outpatient SS clinic. He is out of his home long acting Opiates. Patient pain located to lower back and left arm consistent with prior SSC. Using short acting Percocet without pain control at home. He is supposed to FU with SS provider to refill his home meds however has not made appointment. Denies fever, chills, CP, SOB, N,V.  Given 2 rounds Sub Q Dilaudid so far in ED prior to my assumption of care. Plan to follow up on labs. If labs at baseline can dc home with one dose of his long acting pain meds and close PCP follow up. Physical Exam  BP 126/77   Pulse 82   Temp 98.4 F (36.9 C) (Oral)   Resp 13   Ht 6\' 2"  (1.88 m)   Wt 65.7 kg   SpO2 100%   BMI 18.60 kg/m   Physical Exam Vitals and nursing note reviewed.  Constitutional:      General: He is not in acute distress.    Appearance: He is well-developed. He is not ill-appearing, toxic-appearing or diaphoretic.  HENT:     Head: Atraumatic.  Eyes:     Pupils: Pupils are equal, round, and reactive to light.  Cardiovascular:     Rate and Rhythm: Normal rate and regular rhythm.  Pulmonary:     Effort: Pulmonary effort is normal. No respiratory distress.  Abdominal:     General: There is no distension.     Palpations: Abdomen is soft.  Musculoskeletal:        General: Normal range of motion.     Cervical back: Normal range of motion and neck supple.  Skin:    General: Skin is warm and dry.     Capillary Refill: Capillary refill takes less than 2 seconds.     Comments: No edema, erythema, warmth to extremities.  Neurological:     Mental Status: He is alert.    ED Course/Procedures     Procedures Labs Reviewed  COMPREHENSIVE METABOLIC PANEL - Abnormal; Notable for the following components:      Result Value   Creatinine, Ser  0.60 (*)    Calcium 8.4 (*)    Total Bilirubin 3.7 (*)    All other components within normal limits  CBC WITH DIFFERENTIAL/PLATELET - Abnormal; Notable for the following components:   WBC 14.4 (*)    RBC 3.82 (*)    Hemoglobin 10.7 (*)    HCT 31.2 (*)    RDW 21.0 (*)    nRBC 0.6 (*)    Neutro Abs 10.9 (*)    Monocytes Absolute 1.1 (*)    All other components within normal limits  RETICULOCYTES - Abnormal; Notable for the following components:   Retic Ct Pct 5.2 (*)    RBC. 3.79 (*)    Retic Count, Absolute 197.5 (*)    Immature Retic Fract 40.5 (*)    All other components within normal limits  No results found. MDM  29 year old presents for [facility]. Feels symptoms typical SS crisis pain. No CP, SOB, hemoptysis. Low suspicion for VTE, PE, acute chest syndrome. If labs at baseline can dc after 1 dose long acting home meds and patient can follow up with PCP for refill of his home meds. No red flag for back pain.  Labs personally reviewed  and interpreted: Mild leukocytosis. No evidence of infectious process on exam. No cellulitis or abscess on exam. Question stress reaction. Retics elevated Metabolic panel WO acute abnormality.   Patient reassessed. Pain improved with Sub Q dilaudid ordered by previous provider. Will give home meds since he is out of his long active pain meds and dc home. Encouraged follow up with PCP for pain med refill. He is to return for new or worsening symptoms.  The patient has been appropriately medically screened and/or stabilized in the ED. I have low suspicion for any other emergent medical condition which would require further screening, evaluation or treatment in the ED or require inpatient management.  Patient is hemodynamically stable and in no acute distress.  Patient able to ambulate in department prior to ED.  Evaluation does not show acute pathology that would require ongoing or additional emergent interventions while in the emergency department or further  inpatient treatment.  I have discussed the diagnosis with the patient and answered all questions.  Pain is been managed while in the emergency department and patient has no further complaints prior to discharge.  Patient is comfortable with plan discussed in room and is stable for discharge at this time.  I have discussed strict return precautions for returning to the emergency department.  Patient was encouraged to follow-up with PCP/specialist refer to at discharge.        Mylin Gignac A, PA-C 03/23/19 [id number]    Valarie Merino, MD 03/24/19 [phone]

## 2019-03-23 NOTE — ED Provider Notes (Signed)
[place] DEPT Provider Note   CSN: [id number] Arrival date & time: 03/23/19  [id number]     History Chief Complaint  Patient presents with  . Sickle Cell Pain Crisis    Timothy Marks is a 29 y.o. male.   29 year old male with a medical history significant for sickle cell disease type SS, chronic pain syndrome, opiate dependence and tolerance, anemia of chronic disease, and oppositional defiance disorder presents to the emergency department for persistent back and left arm pain consistent with past sickle cell related pain crisis.  States that he has been out of his long-acting OxyContin over the past few days.  He has been using his short acting Percocet without pain control.  He supposed to follow-up with his primary care doctor for a refill of his medication, but has not yet made an appointment.  Was seen in the ED yesterday for similar complaints and transferred to the sickle cell day hospital for pain management.  Denies any fevers since discharge.  No new or worsening chest pain, shortness of breath, nausea or vomiting.   Sickle Cell Pain Crisis      Past Medical History:  Diagnosis Date  . Depression   . 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 Sickle cell anemia (HCC)   . Vitamin D deficiency y-6    Patient Active Problem List   Diagnosis Date Noted  . SIRS (systemic inflammatory response syndrome) ([place]) 02/10/2019  . Abnormal liver function 11/04/2018  . Sickle cell anemia with crisis ([place]) 05/22/2018  . Acute bilateral low back pain without sciatica   . Other chronic pain   . Anemia of chronic disease   . 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 Physical deconditioning   . Agitation   . Chronic pain syndrome   . Recurrent cold sores   . Fever   . Pain   . Sickle cell crisis ([place]) 07/16/2017  . Adjustment disorder with mixed disturbance of emotions and conduct   . Constipation 08/03/2014    . 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 Protein-calorie malnutrition, severe ([place]) 04/09/2014  . Major depressive disorder, recurrent, severe without psychotic features ([place])   . 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 Vitamin D deficiency   . Sickle cell anemia ([place]) 03/30/2014  . Hyperbilirubinemia 03/30/2014  . Hypokalemia 02/07/2014  . Aggressive behavior 01/25/2013  . Sickle cell anemia with pain ([place]) 01/24/2013  . Sickle cell pain crisis ([place]) 01/24/2013    Past Surgical History:  Procedure Laterality Date  . CHOLECYSTECTOMY         Family History  Problem Relation Age of Onset  . Sickle cell trait Mother   . Sickle cell trait Father     Social History   Tobacco Use  . Smoking status: Never Smoker  . Smokeless tobacco: Never Used  Substance Use Topics  . Alcohol use: No  . Drug use: Not Currently    Types: Marijuana    Home Medications Prior to Admission medications   Medication Sig Start Date End Date Taking? Authorizing Provider  cholecalciferol (VITAMIN D) 1000 units tablet Take 2 tablets (2,000 Units total) by mouth daily. 12/13/17   Azzie Glatter, FNP  folic acid (FOLVITE) 1 MG tablet Take 1 tablet (1 mg total) by mouth daily. 05/01/17   Dorena Dew, FNP  gabapentin (NEURONTIN) 300 MG capsule Take 1 capsule (300 mg total) by mouth 3 (three) times  daily. 03/12/19   Dorena Dew, FNP  hydroxyurea (HYDREA) 500 MG capsule Take 1 capsule (500 mg total) by mouth 2 (two) times daily. May take with food to minimize GI side effects. 05/01/17   Dorena Dew, FNP  ibuprofen (ADVIL) 800 MG tablet Take 1 tablet (800 mg total) by mouth 3 (three) times daily as needed (pain). 03/12/19   Dorena Dew, FNP  Nutritional Supplements (ENSURE ACTIVE HIGH PROTEIN) LIQD Take 1 each by mouth 3 (three) times daily between meals. #90 cans of Ensure 11/13/18   Dorena Dew, FNP  oxyCODONE-acetaminophen (PERCOCET) 10-325 MG tablet Take 1  tablet by mouth every 6 (six) hours as needed for pain. 03/06/19 03/05/20  Dorena Dew, FNP  OXYCONTIN 20 MG 12 hr tablet Take 1 tablet (20 mg total) by mouth every 12 (twelve) hours. 02/22/19   Dorena Dew, FNP    Allergies    Buprenorphine hcl, Levaquin [levofloxacin], Meperidine, Morphine, Morphine and related, Toradol [ketorolac tromethamine], Tramadol, Vancomycin, and Zosyn [piperacillin sod-tazobactam so]  Review of Systems   Review of Systems  Ten systems reviewed and are negative for acute change, except as noted in the HPI.    Physical Exam Updated Vital Signs BP 122/76 (BP Location: Right Arm)   Pulse 82   Temp 98.4 F (36.9 C) (Oral)   Resp 12   Ht 6\' 2"  (1.88 m)   Wt 65.7 kg   SpO2 99%   BMI 18.60 kg/m   Physical Exam Vitals and nursing note reviewed.  Constitutional:      General: He is not in acute distress.    Appearance: He is well-developed. He is not diaphoretic.     Comments: Nontoxic appearing and in NAD  HENT:     Head: Normocephalic and atraumatic.  Eyes:     General: No scleral icterus.    Conjunctiva/sclera: Conjunctivae normal.  Cardiovascular:     Rate and Rhythm: Normal rate and regular rhythm.     Pulses: Normal pulses.  Pulmonary:     Effort: Pulmonary effort is normal. No respiratory distress.     Breath sounds: No stridor. No wheezing.     Comments: Respirations even and unlabored Musculoskeletal:        General: Normal range of motion.     Cervical back: Normal range of motion.  Skin:    General: Skin is warm and dry.     Coloration: Skin is not pale.     Findings: No erythema or rash.  Neurological:     Mental Status: He is alert and oriented to person, place, and time.     Coordination: Coordination normal.  Psychiatric:        Behavior: Behavior normal.     ED Results / Procedures / Treatments   Labs (all labs ordered are listed, but only abnormal results are displayed) Labs Reviewed  COMPREHENSIVE METABOLIC  PANEL  CBC WITH DIFFERENTIAL/PLATELET  RETICULOCYTES    EKG None  Radiology No results found.  Procedures Procedures (including critical care time)  Medications Ordered in ED Medications  sodium chloride flush (NS) 0.9 % injection 3 mL (has no administration in time range)  diphenhydrAMINE (BENADRYL) capsule 25-50 mg (has no administration in time range)  HYDROmorphone (DILAUDID) injection 0.5 mg (0.5 mg Subcutaneous Given 03/23/19 0549)  ondansetron (ZOFRAN-ODT) disintegrating tablet 4 mg (4 mg Oral Given 03/23/19 0549)  HYDROmorphone (DILAUDID) injection 1.5 mg (1.5 mg Subcutaneous Given 03/23/19 [id number])    ED Course  I  have reviewed the triage vital signs and the nursing notes.  Pertinent labs & imaging results that were available during my care of the patient were reviewed by me and considered in my medical decision making (see chart for details).    MDM Rules/Calculators/A&P                      29 year old male with a history of sickle cell presents to the emergency department complaining of persistent pain.  Was evaluated yesterday in the ED and transferred to the sickle cell day hospital where he was subsequently discharged.  Plan to obtain labs today for trending.  Was given 2 mg subcutaneous Dilaudid on arrival.  Patient states he is out of his long-acting OxyContin.  Should pain persist, would give 1 dose of long acting PO meds.  Patient care signed out to [facility], PA-C at shift change.   Final Clinical Impression(s) / ED Diagnoses Final diagnoses:  Sickle cell anemia with pain [facility])    Rx / DC Orders ED Discharge Orders    None       Antonietta Breach, PA-C 03/23/19 0645    Ward, Delice Bison, DO 03/23/19 [phone]

## 2019-03-23 NOTE — Progress Notes (Signed)
Informed ED nurse that the IV team does not use ultra sound to collect blood samples unless IV access is needed.

## 2019-03-23 NOTE — ED Provider Notes (Signed)
[facility] DEPT Provider Note   CSN: [id number] Arrival date & time: 03/23/19  2024     History Chief Complaint  Patient presents with  . Sickle Cell Pain Crisis    Timothy Marks is a 29 y.o. male.  29 year old male with history of sickle cell disease presents with back and arm pain consistent with his sickle cell crisis.  This is his third visit in the past 24 hours.  He has been seen here and treated with hydromorphone.  States he has been taking his home opiates without relief.  Denies any fever or chills.  No cough, congestion, shortness of breath.  States the weather is likely the cause of his current symptoms.  Denies any vomiting or diarrhea or abdominal discomfort.        Past Medical History:  Diagnosis Date  . Depression   . 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 Sickle cell anemia (HCC)   . Vitamin D deficiency y-6    Patient Active Problem List   Diagnosis Date Noted  . SIRS (systemic inflammatory response syndrome) ([place]) 02/10/2019  . Abnormal liver function 11/04/2018  . Sickle cell anemia with crisis ([place]) 05/22/2018  . Acute bilateral low back pain without sciatica   . Other chronic pain   . Anemia of chronic disease   . 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 Physical deconditioning   . Agitation   . Chronic pain syndrome   . Recurrent cold sores   . Fever   . Pain   . Sickle cell crisis ([place]) 07/16/2017  . Adjustment disorder with mixed disturbance of emotions and conduct   . Constipation 08/03/2014  . 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 Protein-calorie malnutrition, severe ([place]) 04/09/2014  . Major depressive disorder, recurrent, severe without psychotic features ([place])   . 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 Vitamin D deficiency   . Sickle cell anemia ([place]) 03/30/2014  . Hyperbilirubinemia 03/30/2014  . Hypokalemia 02/07/2014  .  Aggressive behavior 01/25/2013  . Sickle cell anemia with pain ([place]) 01/24/2013  . Sickle cell pain crisis ([place]) 01/24/2013    Past Surgical History:  Procedure Laterality Date  . CHOLECYSTECTOMY         Family History  Problem Relation Age of Onset  . Sickle cell trait Mother   . Sickle cell trait Father     Social History   Tobacco Use  . Smoking status: Never Smoker  . Smokeless tobacco: Never Used  Substance Use Topics  . Alcohol use: No  . Drug use: Not Currently    Types: Marijuana    Home Medications Prior to Admission medications   Medication Sig Start Date End Date Taking? Authorizing Provider  cholecalciferol (VITAMIN D) 1000 units tablet Take 2 tablets (2,000 Units total) by mouth daily. 12/13/17   Azzie Glatter, FNP  folic acid (FOLVITE) 1 MG tablet Take 1 tablet (1 mg total) by mouth daily. 05/01/17   Dorena Dew, FNP  gabapentin (NEURONTIN) 300 MG capsule Take 1 capsule (300 mg total) by mouth 3 (three) times daily. 03/12/19   Dorena Dew, FNP  hydroxyurea (HYDREA) 500 MG capsule Take 1 capsule (500 mg total) by mouth 2 (two) times daily. May take with food to minimize GI side effects. 05/01/17   Dorena Dew, FNP  ibuprofen (ADVIL) 800 MG tablet Take 1 tablet (800 mg total) by mouth 3 (three) times  daily as needed (pain). 03/12/19   Dorena Dew, FNP  Nutritional Supplements (ENSURE ACTIVE HIGH PROTEIN) LIQD Take 1 each by mouth 3 (three) times daily between meals. #90 cans of Ensure 11/13/18   Dorena Dew, FNP  oxyCODONE-acetaminophen (PERCOCET) 10-325 MG tablet Take 1 tablet by mouth every 6 (six) hours as needed for pain. 03/06/19 03/05/20  Dorena Dew, FNP  OXYCONTIN 20 MG 12 hr tablet Take 1 tablet (20 mg total) by mouth every 12 (twelve) hours. 02/22/19   Dorena Dew, FNP    Allergies    Buprenorphine hcl, Levaquin [levofloxacin], Meperidine, Morphine, Morphine and related, Toradol [ketorolac tromethamine], Tramadol,  Vancomycin, and Zosyn [piperacillin sod-tazobactam so]  Review of Systems   Review of Systems  All other systems reviewed and are negative.   Physical Exam Updated Vital Signs BP 118/68 (BP Location: Right Arm)   Pulse 85   Temp 99.4 F (37.4 C) (Oral)   Resp 18   Ht 1.88 m (6\' 2" )   Wt 65.5 kg   SpO2 98%   BMI 18.55 kg/m   Physical Exam Vitals and nursing note reviewed.  Constitutional:      General: He is not in acute distress.    Appearance: Normal appearance. He is well-developed. He is not toxic-appearing.  HENT:     Head: Normocephalic and atraumatic.  Eyes:     General: Lids are normal.     Conjunctiva/sclera: Conjunctivae normal.     Pupils: Pupils are equal, round, and reactive to light.  Neck:     Thyroid: No thyroid mass.     Trachea: No tracheal deviation.  Cardiovascular:     Rate and Rhythm: Normal rate and regular rhythm.     Heart sounds: Normal heart sounds. No murmur. No gallop.   Pulmonary:     Effort: Pulmonary effort is normal. No respiratory distress.     Breath sounds: Normal breath sounds. No stridor. No decreased breath sounds, wheezing, rhonchi or rales.  Abdominal:     General: Bowel sounds are normal. There is no distension.     Palpations: Abdomen is soft.     Tenderness: There is no abdominal tenderness. There is no rebound.  Musculoskeletal:        General: No tenderness. Normal range of motion.     Cervical back: Normal range of motion and neck supple.       Back:  Skin:    General: Skin is warm and dry.     Findings: No abrasion or rash.  Neurological:     Mental Status: He is alert and oriented to person, place, and time.     GCS: GCS eye subscore is 4. GCS verbal subscore is 5. GCS motor subscore is 6.     Cranial Nerves: No cranial nerve deficit.     Sensory: No sensory deficit.  Psychiatric:        Speech: Speech normal.        Behavior: Behavior normal.     ED Results / Procedures / Treatments   Labs (all labs  ordered are listed, but only abnormal results are displayed) Labs Reviewed - No data to display  EKG [facility]  Radiology No results found.  Procedures Procedures (including critical care time)  Medications Ordered in ED Medications  HYDROmorphone (DILAUDID) injection 2 mg (has no administration in time range)  0.9 %  sodium chloride infusion (has no administration in time range)  HYDROmorphone (DILAUDID) injection 2 mg (has no administration  in time range)  HYDROmorphone (DILAUDID) injection 2 mg (has no administration in time range)  diphenhydrAMINE (BENADRYL) capsule 25-50 mg (has no administration in time range)  ondansetron (ZOFRAN-ODT) disintegrating tablet 8 mg (has no administration in time range)    ED Course  I have reviewed the triage vital signs and the nursing notes.  Pertinent labs & imaging results that were available during my care of the patient were reviewed by me and considered in my medical decision making (see chart for details).    MDM Rules/Calculators/A&P                      Patient order subcu Dilaudid here.  Will consult hospitalist for admission Final Clinical Impression(s) / ED Diagnoses Final diagnoses:  Marks    Rx / DC Orders ED Discharge Orders    [facility]       Lacretia Leigh, MD 03/23/19 2057

## 2019-03-23 NOTE — H&P (Signed)
History and Physical    [facility] Marks DOB: October 08, 1990 DOA: 03/23/2019  PCP: Tresa Garter, MD  Patient coming from: home  Chief Complaint:  Pain all over  HPI: [facility] Marks is a 29 y.o. male with medical history significant of sickle cell anemia comes in with sickle cell pain not relieved with his home pain meds.  Pt just d/c yesterday and was not given a refill or any temporary long acting opiates which he has run out of at home.  His short acting oxy is not working.  deneis any fevers.  Feels his pain crisis is cold weather related.  No n/v/d.  No sob.  Pt has had 3 visits and a hospitalization in the last week.  Referred for admission for pain control.  Several doses of dilaudid in ED have not worked well.  Review of Systems: As per HPI otherwise 10 point review of systems negative.   Past Medical History:  Diagnosis Date  . Depression   . 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 Sickle cell anemia (HCC)   . Vitamin D deficiency y-6    Past Surgical History:  Procedure Laterality Date  . CHOLECYSTECTOMY       reports that he has never smoked. He has never used smokeless tobacco. He reports previous drug use. Drug: Marijuana. He reports that he does not drink alcohol.  Allergies  Allergen Reactions  . Buprenorphine Hcl Hives  . Levaquin [Levofloxacin] Itching  . Meperidine Rash  . Morphine Hives  . Morphine And Related Hives  . Toradol [Ketorolac Tromethamine] Itching  . Tramadol Hives  . Vancomycin Itching  . Zosyn [Piperacillin Sod-Tazobactam So] Itching and Rash    Has taken rocephin in past    Family History  Problem Relation Age of Onset  . Sickle cell trait Mother   . Sickle cell trait Father     Prior to Admission medications   Medication Sig Start Date End Date Taking? Authorizing Provider  cholecalciferol (VITAMIN D) 1000 units tablet Take 2 tablets (2,000 Units total) by  mouth daily. 12/13/17   Azzie Glatter, FNP  folic acid (FOLVITE) 1 MG tablet Take 1 tablet (1 mg total) by mouth daily. 05/01/17   Dorena Dew, FNP  gabapentin (NEURONTIN) 300 MG capsule Take 1 capsule (300 mg total) by mouth 3 (three) times daily. 03/12/19   Dorena Dew, FNP  hydroxyurea (HYDREA) 500 MG capsule Take 1 capsule (500 mg total) by mouth 2 (two) times daily. May take with food to minimize GI side effects. 05/01/17   Dorena Dew, FNP  ibuprofen (ADVIL) 800 MG tablet Take 1 tablet (800 mg total) by mouth 3 (three) times daily as needed (pain). 03/12/19   Dorena Dew, FNP  Nutritional Supplements (ENSURE ACTIVE HIGH PROTEIN) LIQD Take 1 each by mouth 3 (three) times daily between meals. #90 cans of Ensure 11/13/18   Dorena Dew, FNP  oxyCODONE-acetaminophen (PERCOCET) 10-325 MG tablet Take 1 tablet by mouth every 6 (six) hours as needed for pain. 03/06/19 03/05/20  Dorena Dew, FNP  OXYCONTIN 20 MG 12 hr tablet Take 1 tablet (20 mg total) by mouth every 12 (twelve) hours. 02/22/19   Dorena Dew, FNP    Physical Exam: Vitals:   03/23/19 2045 03/23/19 2100 03/23/19 2115 03/23/19 2225  BP:    107/60  Pulse: 72 77 77 84  Resp: 16 (!) 21 (!) 24  18  Temp:      TempSrc:      SpO2: 99% 99% 100% 94%  Weight:      Height:          Constitutional: NAD, calm, comfortable Vitals:   03/23/19 2045 03/23/19 2100 03/23/19 2115 03/23/19 2225  BP:    107/60  Pulse: 72 77 77 84  Resp: 16 (!) 21 (!) 24 18  Temp:      TempSrc:      SpO2: 99% 99% 100% 94%  Weight:      Height:       Eyes: PERRL, lids and conjunctivae normal ENMT: Mucous membranes are moist. Posterior pharynx clear of any exudate or lesions.Normal dentition.  Neck: normal, supple, no masses, no thyromegaly Respiratory: clear to auscultation bilaterally, no wheezing, no crackles. Normal respiratory effort. No accessory muscle use.  Cardiovascular: Regular rate and rhythm, no murmurs /  rubs / gallops. No extremity edema. 2+ pedal pulses. No carotid bruits.  Abdomen: no tenderness, no masses palpated. No hepatosplenomegaly. Bowel sounds positive.  Musculoskeletal: no clubbing / cyanosis. No joint deformity upper and lower extremities. Good ROM, no contractures. Normal muscle tone.  Skin: no rashes, lesions, ulcers. No induration Neurologic: CN 2-12 grossly intact. Sensation intact, DTR normal. Strength 5/5 in all 4.  Psychiatric: Normal judgment and insight. Alert and oriented x 3. Normal mood.    Labs on Admission: I have personally reviewed following labs and imaging studies  CBC: Recent Labs  Lab 03/18/19 1246 03/22/19 0751 03/23/19 0540 03/23/19 2150  WBC 7.8 10.1 14.4* 14.6*  NEUTROABS 4.7 5.7 10.9* 11.5*  HGB 12.3* 11.1* 10.7* 10.3*  HCT 35.5* 33.0* 31.2* 29.9*  MCV 81.6 82.3 81.7 81.9  PLT 379 412* 387 [id number]   Basic Metabolic Panel: Recent Labs  Lab 03/18/19 1246 03/22/19 0751 03/23/19 0540 03/23/19 2150  NA 138 138 138 138  K 4.9 3.7 3.5 4.2  CL 104 102 98 104  CO2 25 28 26 27   GLUCOSE 97 98 97 107*  BUN 6 7 7 6   CREATININE 0.63 0.59* 0.60* 0.59*  CALCIUM 9.3 9.3 8.4* 8.8*   GFR: Estimated Creatinine Clearance: 125.1 mL/min (A) (by C-G formula based on SCr of 0.59 mg/dL (L)). Liver Function Tests: Recent Labs  Lab 03/18/19 1246 03/22/19 0751 03/23/19 0540 03/23/19 2150  AST 45* 26 25 31   ALT 20 15 13 13   ALKPHOS 70 69 70 69  BILITOT 1.9* 2.6* 3.7* 2.2*  PROT 8.1 7.9 7.8 7.7  ALBUMIN 4.6 4.5 4.3 4.1   No results for input(s): LIPASE, AMYLASE in the last 168 hours. No results for input(s): AMMONIA in the last 168 hours. Coagulation Profile: No results for input(s): INR, PROTIME in the last 168 hours. Cardiac Enzymes: No results for input(s): CKTOTAL, CKMB, CKMBINDEX, TROPONINI in the last 168 hours. BNP (last 3 results) No results for input(s): PROBNP in the last 8760 hours. HbA1C: No results for input(s): HGBA1C in the last 72  hours. CBG: No results for input(s): GLUCAP in the last 168 hours. Lipid Profile: No results for input(s): CHOL, HDL, LDLCALC, TRIG, CHOLHDL, LDLDIRECT in the last 72 hours. Thyroid Function Tests: No results for input(s): TSH, T4TOTAL, FREET4, T3FREE, THYROIDAB in the last 72 hours. Anemia Panel: Recent Labs    03/22/19 0751 03/23/19 0540  RETICCTPCT 5.2* 5.2*   Urine analysis:    Component Value Date/Time   COLORURINE YELLOW 03/18/2019 1236   APPEARANCEUR CLEAR 03/18/2019 1236   LABSPEC 1.010 03/18/2019  [place] 9.0 (H) 03/18/2019 1236   GLUCOSEU NEGATIVE 03/18/2019 [place] 03/18/2019 [place] 03/18/2019 1236   BILIRUBINUR neg 01/15/2019 1406   KETONESUR NEGATIVE 03/18/2019 1236   PROTEINUR NEGATIVE 03/18/2019 1236   UROBILINOGEN 4.0 (A) 01/15/2019 1406   UROBILINOGEN 2.0 (H) 05/01/2017 0955   NITRITE NEGATIVE 03/18/2019 1236   LEUKOCYTESUR NEGATIVE 03/18/2019 1236   Sepsis Labs: !!!!!!!!!!!!!!!!!!!!!!!!!!!!!!!!!!!!!!!!!!!! @LABRCNTIP (procalcitonin:4,lacticidven:4) ) Recent Results (from the past 240 hour(s))  Respiratory Panel by RT PCR (Flu A&B, Covid) - Nasopharyngeal Swab     Status: None   Collection Time: 03/23/19  9:15 PM   Specimen: Nasopharyngeal Swab  Result Value Ref Range Status   SARS Coronavirus 2 by RT PCR NEGATIVE NEGATIVE Final    Comment: (NOTE) SARS-CoV-2 target nucleic acids are NOT DETECTED. The SARS-CoV-2 RNA is generally detectable in upper respiratoy specimens during the acute phase of infection. The lowest concentration of SARS-CoV-2 viral copies this assay can detect is 131 copies/mL. A negative result does not preclude SARS-Cov-2 infection and should not be used as the sole basis for treatment or other patient management decisions. A negative result may occur with  improper specimen collection/handling, submission of specimen other than nasopharyngeal swab, presence of viral mutation(s) within  the areas targeted by this assay, and inadequate number of viral copies (<131 copies/mL). A negative result must be combined with clinical observations, patient history, and epidemiological information. The expected result is Negative. Fact Sheet for Patients:  PinkCheek.be Fact Sheet for Healthcare Providers:  GravelBags.it This test is not yet ap proved or cleared by the [place] FDA and  has been authorized for detection and/or diagnosis of SARS-CoV-2 by FDA under an Emergency Use Authorization (EUA). This EUA will remain  in effect (meaning this test can be used) for the duration of the COVID-19 declaration under Section 564(b)(1) of the Act, 21 U.S.C. section 360bbb-3(b)(1), unless the authorization is terminated or revoked sooner.    Influenza A by PCR NEGATIVE NEGATIVE Final   Influenza B by PCR NEGATIVE NEGATIVE Final    Comment: (NOTE) The Xpert Xpress SARS-CoV-2/FLU/RSV assay is intended as an aid in  the diagnosis of influenza from Nasopharyngeal swab specimens and  should not be used as a sole basis for treatment. Nasal washings and  aspirates are unacceptable for Xpert Xpress SARS-CoV-2/FLU/RSV  testing. Fact Sheet for Patients: PinkCheek.be Fact Sheet for Healthcare Providers: GravelBags.it This test is not yet approved or cleared by the [place] FDA and  has been authorized for detection and/or diagnosis of SARS-CoV-2 by  FDA under an Emergency Use Authorization (EUA). This EUA will remain  in effect (meaning this test can be used) for the duration of the  Covid-19 declaration under Section 564(b)(1) of the Act, 21  U.S.C. section 360bbb-3(b)(1), unless the authorization is  terminated or revoked. Performed at [facility], [place] [address], [place], [place] [address]      Radiological Exams on Admission: No results  found.   Old chart reviewed Case discussed with edp dr Zenia Resides   Assessment/Plan 29 yo male with sickle cell pain crisis  Principal Problem:   Sickle cell anemia with crisis ([place])- start back his chronic pain medication regimen.  At discharge please provide pt with temporary refill of his chronic pain meds or document rational for not doing so.  Allergic to nsaids, hold toradol.  Place on dilaudid pca pump.  Cont home meds.  Bili coming down from yesterday.  Wbc elev likely due to stress release.  Aggressive ivf.  Active Problems:    Other chronic pain- resume oxy 12 hour 20mg  bid along with prn percocet 10mg     Anemia of chronic disease- stable, hgb over 10.     DVT prophylaxis: scds Code Status: full Family Communication:  none Disposition Plan:  tomorrow Consults called:  none Admission status:  observation   Camara Renstrom A MD [facility]  If 7PM-7AM, please contact night-coverage [email or web address] Password [email or web address]  03/23/2019, 11:05 PM

## 2019-03-23 NOTE — ED Triage Notes (Signed)
Patient complaining of pain all over. Patient states it is a sickle cell crisis. Home medications is not working.

## 2019-03-23 NOTE — Discharge Instructions (Addendum)
Call your PCP for a refill of your home meds. Return for new or worsening symptoms.

## 2019-03-23 NOTE — ED Triage Notes (Signed)
Per EMS, patient from home and was here earlier today. Patient c/o generalized pain, especially in the left arm. Patient states he is having sickle cell crisis. A&ox4, from home.

## 2019-03-23 NOTE — ED Notes (Signed)
IV team consulted to place an IV and attempt to draw back blood. Pt told that IV team RN that he didn't need and IV and they refusing to attempt stating, "I'm not phlebotomy." RN called phlebotomy at this time and she states that she is alone but will come as soon as possible.

## 2019-03-24 DIAGNOSIS — Z832 Family history of diseases of the blood and blood-forming organs and certain disorders involving the immune mechanism: Secondary | ICD-10-CM | POA: Diagnosis not present

## 2019-03-24 DIAGNOSIS — Z79899 Other long term (current) drug therapy: Secondary | ICD-10-CM | POA: Diagnosis not present

## 2019-03-24 DIAGNOSIS — G8929 Other chronic pain: Secondary | ICD-10-CM

## 2019-03-24 DIAGNOSIS — F913 Oppositional defiant disorder: Secondary | ICD-10-CM | POA: Diagnosis present

## 2019-03-24 DIAGNOSIS — D5700 Hb-SS disease with crisis, unspecified: Secondary | ICD-10-CM | POA: Diagnosis present

## 2019-03-24 DIAGNOSIS — Z885 Allergy status to narcotic agent status: Secondary | ICD-10-CM | POA: Diagnosis not present

## 2019-03-24 DIAGNOSIS — Z880 Allergy status to penicillin: Secondary | ICD-10-CM | POA: Diagnosis not present

## 2019-03-24 DIAGNOSIS — Z888 Allergy status to other drugs, medicaments and biological substances status: Secondary | ICD-10-CM | POA: Diagnosis not present

## 2019-03-24 DIAGNOSIS — F329 Major depressive disorder, single episode, unspecified: Secondary | ICD-10-CM | POA: Diagnosis present

## 2019-03-24 DIAGNOSIS — D638 Anemia in other chronic diseases classified elsewhere: Secondary | ICD-10-CM

## 2019-03-24 DIAGNOSIS — Z20822 Contact with and (suspected) exposure to covid-19: Secondary | ICD-10-CM | POA: Diagnosis present

## 2019-03-24 DIAGNOSIS — F1120 Opioid dependence, uncomplicated: Secondary | ICD-10-CM | POA: Diagnosis present

## 2019-03-24 DIAGNOSIS — G894 Chronic pain syndrome: Secondary | ICD-10-CM | POA: Diagnosis present

## 2019-03-24 DIAGNOSIS — Z79891 Long term (current) use of opiate analgesic: Secondary | ICD-10-CM | POA: Diagnosis not present

## 2019-03-24 MED ORDER — NALOXONE HCL 0.4 MG/ML IJ SOLN: 0.4000 mg | INTRAMUSCULAR | Status: DC | PRN

## 2019-03-24 MED ORDER — HYDROMORPHONE HCL 1 MG/ML IJ SOLN
0.5000 mg | Freq: Once | INTRAMUSCULAR | Status: AC
  Administered 2019-03-24: 0.5 mg via INTRAVENOUS
  Filled 2019-03-24: qty 0.5

## 2019-03-24 MED ORDER — HYDROMORPHONE 1 MG/ML IV SOLN
INTRAVENOUS | Status: DC
  Administered 2019-03-24: 2.1 mg via INTRAVENOUS
  Administered 2019-03-24: 3 mg via INTRAVENOUS
  Administered 2019-03-25: 9.5 mg via INTRAVENOUS
  Administered 2019-03-25: 30 mg via INTRAVENOUS
  Administered 2019-03-25: 1.5 mg via INTRAVENOUS
  Administered 2019-03-25: 6 mg via INTRAVENOUS
  Administered 2019-03-25: 0.3 mg via INTRAVENOUS
  Administered 2019-03-26: 1.5 mg via INTRAVENOUS
  Administered 2019-03-26 (×2): 4.5 mg via INTRAVENOUS
  Administered 2019-03-26: 30 mg via INTRAVENOUS
  Filled 2019-03-24 (×2): qty 30

## 2019-03-24 MED ORDER — SODIUM CHLORIDE 0.9% FLUSH: 9.0000 mL | INTRAVENOUS | Status: DC | PRN

## 2019-03-24 NOTE — Progress Notes (Signed)
Subjective: Timothy Marks, a 29 year old male with a medical history significant for sickle cell disease type SS, chronic pain syndrome, opiate dependence and tolerance, history of oppositional defiance disorder, and history of anemia of chronic disease was admitted for sickle cell pain crisis.  Patient is complaining of worsening pain, using curse words and wishing that healthcare providers "have sickle cell disease". He rates his pain at >10/10. He denies any fever, cough, chest pain or SOB. No nausea, vomiting or diarrhea.  Objective:  Vital signs in last 24 hours:  Vitals:   03/24/19 0834 03/24/19 1032 03/24/19 1218 03/24/19 1340  BP:  102/70  121/75  Pulse:  80  69  Resp: 13 14 11 12   Temp:  98.9 F (37.2 C)  (!) 97.4 F (36.3 C)  TempSrc:  Oral  Oral  SpO2: 99% 99% 96% 97%  Weight:      Height:        Intake/Output from previous day:   Intake/Output Summary (Last 24 hours) at 03/24/2019 1527 Last data filed at 03/24/2019 1349 Gross per 24 hour  Intake 696.56 ml  Output 350 ml  Net 346.56 ml    Physical Exam: General: Alert, awake, oriented x3, in no acute distress.  HEENT: [place]/AT [place]ERL, EOMI Neck: Trachea midline,  no masses, no thyromegal,y no JVD, no carotid bruit OROPHARYNX:  Moist, No exudate/ erythema/lesions.  Heart: Regular rate and rhythm, without murmurs, rubs, gallops, PMI non-displaced, no heaves or thrills on palpation.  Lungs: Clear to auscultation, no wheezing or rhonchi noted. No increased vocal fremitus resonant to percussion  Abdomen: Soft, nontender, nondistended, positive bowel sounds, no masses no hepatosplenomegaly noted..  Neuro: No focal neurological deficits noted cranial nerves II through XII grossly intact. DTRs 2+ bilaterally upper and lower extremities. Strength 5 out of 5 in bilateral upper and lower extremities. Musculoskeletal: No warm swelling or erythema around joints, no spinal tenderness noted. Psychiatric: Patient alert and oriented  x3, good insight and cognition, good recent to remote recall. Lymph node survey: No cervical axillary or inguinal lymphadenopathy noted.  Lab Results:  Basic Metabolic Panel:    Component Value Date/Time   NA 138 03/23/2019 2150   NA 139 08/31/2018 0854   K 4.2 03/23/2019 2150   CL 104 03/23/2019 2150   CO2 27 03/23/2019 2150   BUN 6 03/23/2019 2150   BUN 6 08/31/2018 0854   CREATININE 0.59 (L) 03/23/2019 2150   GLUCOSE 107 (H) 03/23/2019 2150   CALCIUM 8.8 (L) 03/23/2019 2150   CBC:    Component Value Date/Time   WBC 14.6 (H) 03/23/2019 2150   HGB 10.3 (L) 03/23/2019 2150   HGB 8.0 (L) 11/13/2018 1408   HCT 29.9 (L) 03/23/2019 2150   HCT 25.1 (L) 11/13/2018 1408   PLT 383 03/23/2019 2150   PLT 792 (H) 11/13/2018 1408   MCV 81.9 03/23/2019 2150   MCV 95 11/13/2018 1408   NEUTROABS 11.5 (H) 03/23/2019 2150   NEUTROABS 5.5 11/13/2018 1408   LYMPHSABS 1.6 03/23/2019 2150   LYMPHSABS 2.9 11/13/2018 1408   MONOABS 1.2 (H) 03/23/2019 2150   EOSABS 0.3 03/23/2019 2150   EOSABS 0.0 11/13/2018 1408   BASOSABS 0.0 03/23/2019 2150   BASOSABS 0.0 11/13/2018 1408    Recent Results (from the past 240 hour(s))  Respiratory Panel by RT PCR (Flu A&B, Covid) - Nasopharyngeal Swab     Status: None   Collection Time: 03/23/19  9:15 PM   Specimen: Nasopharyngeal Swab  Result Value Ref Range  Status   SARS Coronavirus 2 by RT PCR NEGATIVE NEGATIVE Final    Comment: (NOTE) SARS-CoV-2 target nucleic acids are NOT DETECTED. The SARS-CoV-2 RNA is generally detectable in upper respiratoy specimens during the acute phase of infection. The lowest concentration of SARS-CoV-2 viral copies this assay can detect is 131 copies/mL. A negative result does not preclude SARS-Cov-2 infection and should not be used as the sole basis for treatment or other patient management decisions. A negative result may occur with  improper specimen collection/handling, submission of specimen other than  nasopharyngeal swab, presence of viral mutation(s) within the areas targeted by this assay, and inadequate number of viral copies (<131 copies/mL). A negative result must be combined with clinical observations, patient history, and epidemiological information. The expected result is Negative. Fact Sheet for Patients:  PinkCheek.be Fact Sheet for Healthcare Providers:  GravelBags.it This test is not yet ap proved or cleared by the [place] FDA and  has been authorized for detection and/or diagnosis of SARS-CoV-2 by FDA under an Emergency Use Authorization (EUA). This EUA will remain  in effect (meaning this test can be used) for the duration of the COVID-19 declaration under Section 564(b)(1) of the Act, 21 U.S.C. section 360bbb-3(b)(1), unless the authorization is terminated or revoked sooner.    Influenza A by PCR NEGATIVE NEGATIVE Final   Influenza B by PCR NEGATIVE NEGATIVE Final    Comment: (NOTE) The Xpert Xpress SARS-CoV-2/FLU/RSV assay is intended as an aid in  the diagnosis of influenza from Nasopharyngeal swab specimens and  should not be used as a sole basis for treatment. Nasal washings and  aspirates are unacceptable for Xpert Xpress SARS-CoV-2/FLU/RSV  testing. Fact Sheet for Patients: PinkCheek.be Fact Sheet for Healthcare Providers: GravelBags.it This test is not yet approved or cleared by the [place] FDA and  has been authorized for detection and/or diagnosis of SARS-CoV-2 by  FDA under an Emergency Use Authorization (EUA). This EUA will remain  in effect (meaning this test can be used) for the duration of the  Covid-19 declaration under Section 564(b)(1) of the Act, 21  U.S.C. section 360bbb-3(b)(1), unless the authorization is  terminated or revoked. Performed at [facility], [place] [address], [place], [place] [address]      Studies/Results: No results found.  Medications: Scheduled Meds: . cholecalciferol  2,000 Units Oral Daily  . folic acid  1 mg Oral Daily  . gabapentin  300 mg Oral TID  . HYDROmorphone   Intravenous Q4H  . hydroxyurea  500 mg Oral BID  . oxyCODONE  20 mg Oral Q12H  . senna-docusate  1 tablet Oral BID   Continuous Infusions: . sodium chloride 50 mL/hr at 03/24/19 0039  . sodium chloride 10 mL/hr (03/23/19 2109)  . diphenhydrAMINE     PRN Meds:.alum & mag hydroxide-simeth, diphenhydrAMINE **OR** diphenhydrAMINE, diphenhydrAMINE **OR** diphenhydrAMINE, diphenhydrAMINE, ibuprofen, naloxone **AND** sodium chloride flush, ondansetron **OR** ondansetron (ZOFRAN) IV, ondansetron (ZOFRAN) IV, ondansetron, oxyCODONE-acetaminophen **AND** oxyCODONE, polyethylene glycol  Assessment/Plan: Principal Problem:   Sickle cell anemia with crisis ([place]) Active Problems:   Sickle cell crisis ([place])   Other chronic pain   Anemia of chronic disease   Sickle-cell crisis ([place])  1. Hb Sickle Cell Disease with crisis: Continue IVF D5 .45% Saline @ 125 mls/hour, continue weight based Dilaudid PCA, IV Toradol is contraindicated due to allergy, add home pain medications, add Ibuprofen 600 mg Q 6 H. Monitor vitals very closely, Re-evaluate pain scale regularly, 2 L of Oxygen by [place].  2. Leukocytosis: Most likely reactive, no clinical evidence of infection or inflammation. Will continue to monitor without antibiotics. 3. Sickle Cell Anemia: Hb is stable at baseline. No clinical indication for blood transfusion today. 4. Chronic pain Syndrome: Restart and continue home pain medications.  Code Status: Full Code Family Communication: N/A Disposition Plan: Not yet ready for discharge  Heiley Shaikh  If 7PM-7AM, please contact night-coverage.  03/24/2019, 3:27 PM  LOS: 0 days Patient ID: Timothy Marks, male   DOB: Jun 17, 1990, 29 y.o.   MRN: [id number]

## 2019-03-24 NOTE — Progress Notes (Signed)
Timothy Marks, admitted for [facility] demands to have his bed in high position and is uncooperative. He refuses to have his H&P completed and to lower his bed. Try to explain to this patient the safety issues involved with the bed remaining in a high position and the need to complete and update his H&P. Patient continues to silence this nurse and asked this nuse not to speak at this time. Timothy Marks is rude and verbally aggressive.

## 2019-03-25 LAB — CBC WITH DIFFERENTIAL/PLATELET
Abs Immature Granulocytes: 0.07 10*3/uL (ref 0.00–0.07)
Basophils Absolute: 0 10*3/uL (ref 0.0–0.1)
Basophils Relative: 0 %
Eosinophils Absolute: 0.3 10*3/uL (ref 0.0–0.5)
Eosinophils Relative: 3 %
HCT: 26.8 % — ABNORMAL LOW (ref 39.0–52.0)
Hemoglobin: 9.1 g/dL — ABNORMAL LOW (ref 13.0–17.0)
Immature Granulocytes: 1 %
Lymphocytes Relative: 27 %
Lymphs Abs: 2.8 10*3/uL (ref 0.7–4.0)
MCH: 27.5 pg (ref 26.0–34.0)
MCHC: 34 g/dL (ref 30.0–36.0)
MCV: 81 fL (ref 80.0–100.0)
Monocytes Absolute: 0.9 10*3/uL (ref 0.1–1.0)
Monocytes Relative: 9 %
Neutro Abs: 6.1 10*3/uL (ref 1.7–7.7)
Neutrophils Relative %: 60 %
Platelets: 402 10*3/uL — ABNORMAL HIGH (ref 150–400)
RBC: 3.31 MIL/uL — ABNORMAL LOW (ref 4.22–5.81)
RDW: 21.1 % — ABNORMAL HIGH (ref 11.5–15.5)
WBC: 10.2 10*3/uL (ref 4.0–10.5)
nRBC: 1.1 % — ABNORMAL HIGH (ref 0.0–0.2)

## 2019-03-25 LAB — COMPREHENSIVE METABOLIC PANEL
ALT: 13 U/L (ref 0–44)
AST: 28 U/L (ref 15–41)
Albumin: 4.1 g/dL (ref 3.5–5.0)
Alkaline Phosphatase: 69 U/L (ref 38–126)
Anion gap: 10 (ref 5–15)
BUN: 9 mg/dL (ref 6–20)
CO2: 26 mmol/L (ref 22–32)
Calcium: 9.3 mg/dL (ref 8.9–10.3)
Chloride: 103 mmol/L (ref 98–111)
Creatinine, Ser: 0.57 mg/dL — ABNORMAL LOW (ref 0.61–1.24)
GFR calc Af Amer: >60 mL/min (ref >60)
GFR calc non Af Amer: >60 mL/min (ref >60)
Glucose, Bld: 87 mg/dL (ref 70–99)
Potassium: 5.2 mmol/L — ABNORMAL HIGH (ref 3.5–5.1)
Sodium: 139 mmol/L (ref 135–145)
Total Bilirubin: 3.9 mg/dL — ABNORMAL HIGH (ref 0.3–1.2)
Total Protein: 7.6 g/dL (ref 6.5–8.1)

## 2019-03-25 LAB — POTASSIUM: Potassium: 5.3 mmol/L — ABNORMAL HIGH (ref 3.5–5.1)

## 2019-03-25 MED ORDER — IBUPROFEN 200 MG PO TABS
600.0000 mg | ORAL_TABLET | Freq: Four times a day (QID) | ORAL | Status: DC | PRN
  Administered 2019-03-26: 600 mg via ORAL
  Filled 2019-03-25 (×2): qty 3

## 2019-03-25 NOTE — Progress Notes (Signed)
Subjective: Timothy Marks, a 29 year old male with a medical history significant for sickle cell disease type SS, chronic pain syndrome, opiate dependence and tolerance, history of oppositional defiance disorder, and history of anemia of chronic disease was admitted for sickle cell pain crisis.  Today, patient is complaining of worsening pain to left upper extremity.  Pain is characterized as constant, throbbing, and occasionally sharp.  Patient states that pain is unrelieved despite IV Dilaudid PCA.  Pain intensity is 9/10. Patient is afebrile and oxygen saturation is 93% on RA.  He denies headache, chest pain, shortness of breath, dizziness, urinary symptoms, nausea, vomiting, or diarrhea.  Objective:  Vital signs in last 24 hours:  Vitals:   03/25/19 1037 03/25/19 1405 03/25/19 1549 03/25/19 1556  BP:  (!) 84/68  117/78  Pulse:  93  92  Resp: 12 17 12 15   Temp:  98.4 F (36.9 C)  98.3 F (36.8 C)  TempSrc:  Oral  Oral  SpO2: 99% 98% 93% 95%  Weight:      Height:        Intake/Output from previous day:   Intake/Output Summary (Last 24 hours) at 03/25/2019 1702 Last data filed at 03/25/2019 1514 Gross per 24 hour  Intake 1207.99 ml  Output 950 ml  Net 257.99 ml    Physical Exam: General: Alert, awake, oriented x3, in no acute distress.  HEENT: [place]/AT [place]ER[place], EOMI Neck: Trachea midline,  no masses, no thyromegal,y no JVD, no carotid bruit OROPHARYNX:  Moist, No exudate/ erythema/lesions.  Heart: Regular rate and rhythm, without murmurs, rubs, gallops, PMI non-displaced, no heaves or thrills on palpation.  Lungs: Clear to auscultation, no wheezing or rhonchi noted. No increased vocal fremitus resonant to percussion  Abdomen: Soft, nontender, nondistended, positive bowel sounds, no masses no hepatosplenomegaly noted..  Neuro: No focal neurological deficits noted cranial nerves II through XII grossly intact. DTRs 2+ bilaterally upper and lower extremities. Strength 5 out of 5 in  bilateral upper and lower extremities. Musculoskeletal: No warm swelling or erythema around joints, no spinal tenderness noted. Psychiatric: Patient alert and oriented x3, good insight and cognition, good recent to remote recall. Lymph node survey: No cervical axillary or inguinal lymphadenopathy noted.  Lab Results:  Basic Metabolic Panel:    Component Value Date/Time   NA 139 03/25/2019 0456   NA 139 08/31/2018 0854   K 5.3 (H) 03/25/2019 1133   CL 103 03/25/2019 0456   CO2 26 03/25/2019 0456   BUN 9 03/25/2019 0456   BUN 6 08/31/2018 0854   CREATININE 0.57 (L) 03/25/2019 0456   GLUCOSE 87 03/25/2019 0456   CALCIUM 9.3 03/25/2019 0456   CBC:    Component Value Date/Time   WBC 10.2 03/25/2019 0456   HGB 9.1 (L) 03/25/2019 0456   HGB 8.0 (L) 11/13/2018 1408   HCT 26.8 (L) 03/25/2019 0456   HCT 25.1 (L) 11/13/2018 1408   PLT 402 (H) 03/25/2019 0456   PLT 792 (H) 11/13/2018 1408   MCV 81.0 03/25/2019 0456   MCV 95 11/13/2018 1408   NEUTROABS 6.1 03/25/2019 0456   NEUTROABS 5.5 11/13/2018 1408   LYMPHSABS 2.8 03/25/2019 0456   LYMPHSABS 2.9 11/13/2018 1408   MONOABS 0.9 03/25/2019 0456   EOSABS 0.3 03/25/2019 0456   EOSABS 0.0 11/13/2018 1408   BASOSABS 0.0 03/25/2019 0456   BASOSABS 0.0 11/13/2018 1408    Recent Results (from the past 240 hour(s))  Respiratory Panel by RT PCR (Flu A&B, Covid) - Nasopharyngeal Swab  Status: None   Collection Time: 03/23/19  9:15 PM   Specimen: Nasopharyngeal Swab  Result Value Ref Range Status   SARS Coronavirus 2 by RT PCR NEGATIVE NEGATIVE Final    Comment: (NOTE) SARS-CoV-2 target nucleic acids are NOT DETECTED. The SARS-CoV-2 RNA is generally detectable in upper respiratoy specimens during the acute phase of infection. The lowest concentration of SARS-CoV-2 viral copies this assay can detect is 131 copies/mL. A negative result does not preclude SARS-Cov-2 infection and should not be used as the sole basis for treatment  or other patient management decisions. A negative result may occur with  improper specimen collection/handling, submission of specimen other than nasopharyngeal swab, presence of viral mutation(s) within the areas targeted by this assay, and inadequate number of viral copies (<131 copies/mL). A negative result must be combined with clinical observations, patient history, and epidemiological information. The expected result is Negative. Fact Sheet for Patients:  PinkCheek.be Fact Sheet for Healthcare Providers:  GravelBags.it This test is not yet ap proved or cleared by the [place] FDA and  has been authorized for detection and/or diagnosis of SARS-CoV-2 by FDA under an Emergency Use Authorization (EUA). This EUA will remain  in effect (meaning this test can be used) for the duration of the COVID-19 declaration under Section 564(b)(1) of the Act, 21 U.S.C. section 360bbb-3(b)(1), unless the authorization is terminated or revoked sooner.    Influenza A by PCR NEGATIVE NEGATIVE Final   Influenza B by PCR NEGATIVE NEGATIVE Final    Comment: (NOTE) The Xpert Xpress SARS-CoV-2/FLU/RSV assay is intended as an aid in  the diagnosis of influenza from Nasopharyngeal swab specimens and  should not be used as a sole basis for treatment. Nasal washings and  aspirates are unacceptable for Xpert Xpress SARS-CoV-2/FLU/RSV  testing. Fact Sheet for Patients: PinkCheek.be Fact Sheet for Healthcare Providers: GravelBags.it This test is not yet approved or cleared by the [place] FDA and  has been authorized for detection and/or diagnosis of SARS-CoV-2 by  FDA under an Emergency Use Authorization (EUA). This EUA will remain  in effect (meaning this test can be used) for the duration of the  Covid-19 declaration under Section 564(b)(1) of the Act, 21  U.S.C. section  360bbb-3(b)(1), unless the authorization is  terminated or revoked. Performed at [facility], [place] [address] [place], [place] [address]     Studies/Results: No results found.  Medications: Scheduled Meds: . cholecalciferol  2,000 Units Oral Daily  . folic acid  1 mg Oral Daily  . gabapentin  300 mg Oral TID  . HYDROmorphone   Intravenous Q4H  . hydroxyurea  500 mg Oral BID  . oxyCODONE  20 mg Oral Q12H  . senna-docusate  1 tablet Oral BID   Continuous Infusions: . sodium chloride 50 mL/hr at 03/25/19 1537  . diphenhydrAMINE     PRN Meds:.alum & mag hydroxide-simeth, diphenhydrAMINE **OR** diphenhydrAMINE, diphenhydrAMINE, ibuprofen, naloxone **AND** sodium chloride flush, [DISCONTINUED] ondansetron **OR** ondansetron (ZOFRAN) IV, ondansetron, oxyCODONE-acetaminophen **AND** oxyCODONE, polyethylene glycol  Consultants:  None  Procedures:  None  Antibiotics:  None  Assessment/Plan: Principal Problem:   Sickle cell anemia with crisis (H[facility]) Active Problems:   Sickle cell crisis ([place])   Other chronic pain   Anemia of chronic disease   Sickle-cell crisis ([place])  Sickle cell disease with pain crisis: Continue IV fluids at 50 mL/h. Continue PCA Dilaudid, no changes in settings on today. Percocet 10-325 mg every 4 hours as needed for severe breakthrough pain  Ibuprofen 600 mg every 6 hours as needed Monitor vital signs closely, reevaluate pain scale regularly, and supplemental oxygen as needed  Sickle cell anemia: Hemoglobin 9.1, consistent with patient's baseline.  There is no clinical indication for blood transfusion at this time.  Follow CBC in a.m.  Chronic pain syndrome: Continue home medications  Leukocytosis: Resolved.  Patient continues to be afebrile without signs of infection or inflammation.  Continue to monitor closely.    Code Status: Full Code Family Communication: N/A Disposition Plan: Not yet ready for discharge  [place], MSN, FNP-C Patient [place] [address] [place], [place] [address] [phone]  If 5PM-7AM, please contact night-coverage.  03/25/2019, 5:02 PM  LOS: 1 day

## 2019-03-26 LAB — CBC
HCT: 25.7 % — ABNORMAL LOW (ref 39.0–52.0)
Hemoglobin: 8.9 g/dL — ABNORMAL LOW (ref 13.0–17.0)
MCH: 28 pg (ref 26.0–34.0)
MCHC: 34.6 g/dL (ref 30.0–36.0)
MCV: 80.8 fL (ref 80.0–100.0)
Platelets: 383 10*3/uL (ref 150–400)
RBC: 3.18 MIL/uL — ABNORMAL LOW (ref 4.22–5.81)
RDW: 21.2 % — ABNORMAL HIGH (ref 11.5–15.5)
WBC: 13.9 10*3/uL — ABNORMAL HIGH (ref 4.0–10.5)
nRBC: 0.4 % — ABNORMAL HIGH (ref 0.0–0.2)

## 2019-03-26 MED ORDER — OXYCODONE HCL 5 MG PO TABS
10.0000 mg | ORAL_TABLET | ORAL | Status: DC
  Administered 2019-03-26 – 2019-03-27 (×4): 10 mg via ORAL
  Filled 2019-03-26 (×4): qty 2

## 2019-03-26 MED ORDER — HYDROMORPHONE 1 MG/ML IV SOLN
INTRAVENOUS | Status: DC
  Administered 2019-03-26: 1 mg via INTRAVENOUS
  Administered 2019-03-26: 2 mg via INTRAVENOUS
  Administered 2019-03-27 (×2): 1.5 mg via INTRAVENOUS

## 2019-03-26 MED ORDER — HYDROMORPHONE HCL 1 MG/ML IJ SOLN: 1.0000 mg | Freq: Once | INTRAMUSCULAR | Status: DC

## 2019-03-26 NOTE — Progress Notes (Cosign Needed)
Subjective: Timothy Marks, a 29 year old male with a medical history significant for sickle cell disease type SS, chronic pain syndrome, opiate dependence and tolerance, history of oppositional defiant disorder, and history of anemia of chronic disease was admitted for sickle cell pain crisis.  Patient is continuing to complain of pain primarily to left upper extremity and low back.  Patient has no new complaints on today.  Pain intensity is 7/10.  Patient is afebrile.  He denies headache, chest pain, shortness of breath, dizziness, urinary symptoms, nausea, vomiting, or diarrhea.  Objective:  Vital signs in last 24 hours:  Vitals:   03/26/19 1013 03/26/19 1350 03/26/19 1714 03/26/19 1750  BP: (!) 110/55 92/60  117/76  Pulse: 77 80  84  Resp: 15 15 12 16   Temp: 98.4 F (36.9 C) 98.2 F (36.8 C)  98.8 F (37.1 C)  TempSrc: Oral Oral  Oral  SpO2: 91% 97% 98% 97%  Weight:      Height:        Intake/Output from previous day:   Intake/Output Summary (Last 24 hours) at 03/26/2019 2003 Last data filed at 03/26/2019 0523 Gross per 24 hour  Intake 360 ml  Output 800 ml  Net -440 ml    Physical Exam: General: Alert, awake, oriented x3, in no acute distress.  HEENT: [place]/AT PEERL, EOMI Neck: Trachea midline,  no masses, no thyromegal,y no JVD, no carotid bruit OROPHARYNX:  Moist, No exudate/ erythema/lesions.  Heart: Regular rate and rhythm, without murmurs, rubs, gallops, PMI non-displaced, no heaves or thrills on palpation.  Lungs: Clear to auscultation, no wheezing or rhonchi noted. No increased vocal fremitus resonant to percussion  Abdomen: Soft, nontender, nondistended, positive bowel sounds, no masses no hepatosplenomegaly noted..  Neuro: No focal neurological deficits noted cranial nerves II through XII grossly intact. DTRs 2+ bilaterally upper and lower extremities. Strength 5 out of 5 in bilateral upper and lower extremities. Musculoskeletal: No warm swelling or erythema around  joints, no spinal tenderness noted. Psychiatric: Patient alert and oriented x3, good insight and cognition, good recent to remote recall. Lymph node survey: No cervical axillary or inguinal lymphadenopathy noted.  Lab Results:  Basic Metabolic Panel:    Component Value Date/Time   NA 139 03/25/2019 0456   NA 139 08/31/2018 0854   K 5.3 (H) 03/25/2019 1133   CL 103 03/25/2019 0456   CO2 26 03/25/2019 0456   BUN 9 03/25/2019 0456   BUN 6 08/31/2018 0854   CREATININE 0.57 (L) 03/25/2019 0456   GLUCOSE 87 03/25/2019 0456   CALCIUM 9.3 03/25/2019 0456   CBC:    Component Value Date/Time   WBC 13.9 (H) 03/26/2019 0540   HGB 8.9 (L) 03/26/2019 0540   HGB 8.0 (L) 11/13/2018 1408   HCT 25.7 (L) 03/26/2019 0540   HCT 25.1 (L) 11/13/2018 1408   PLT 383 03/26/2019 0540   PLT 792 (H) 11/13/2018 1408   MCV 80.8 03/26/2019 0540   MCV 95 11/13/2018 1408   NEUTROABS 6.1 03/25/2019 0456   NEUTROABS 5.5 11/13/2018 1408   LYMPHSABS 2.8 03/25/2019 0456   LYMPHSABS 2.9 11/13/2018 1408   MONOABS 0.9 03/25/2019 0456   EOSABS 0.3 03/25/2019 0456   EOSABS 0.0 11/13/2018 1408   BASOSABS 0.0 03/25/2019 0456   BASOSABS 0.0 11/13/2018 1408    Recent Results (from the past 240 hour(s))  Respiratory Panel by RT PCR (Flu A&B, Covid) - Nasopharyngeal Swab     Status: None   Collection Time: 03/23/19  9:15 PM  Specimen: Nasopharyngeal Swab  Result Value Ref Range Status   SARS Coronavirus 2 by RT PCR NEGATIVE NEGATIVE Final    Comment: (NOTE) SARS-CoV-2 target nucleic acids are NOT DETECTED. The SARS-CoV-2 RNA is generally detectable in upper respiratoy specimens during the acute phase of infection. The lowest concentration of SARS-CoV-2 viral copies this assay can detect is 131 copies/mL. A negative result does not preclude SARS-Cov-2 infection and should not be used as the sole basis for treatment or other patient management decisions. A negative result may occur with  improper specimen  collection/handling, submission of specimen other than nasopharyngeal swab, presence of viral mutation(s) within the areas targeted by this assay, and inadequate number of viral copies (<131 copies/mL). A negative result must be combined with clinical observations, patient history, and epidemiological information. The expected result is Negative. Fact Sheet for Patients:  PinkCheek.be Fact Sheet for Healthcare Providers:  GravelBags.it This test is not yet ap proved or cleared by the [place] FDA and  has been authorized for detection and/or diagnosis of SARS-CoV-2 by FDA under an Emergency Use Authorization (EUA). This EUA will remain  in effect (meaning this test can be used) for the duration of the COVID-19 declaration under Section 564(b)(1) of the Act, 21 U.S.C. section 360bbb-3(b)(1), unless the authorization is terminated or revoked sooner.    Influenza A by PCR NEGATIVE NEGATIVE Final   Influenza B by PCR NEGATIVE NEGATIVE Final    Comment: (NOTE) The Xpert Xpress SARS-CoV-2/FLU/RSV assay is intended as an aid in  the diagnosis of influenza from Nasopharyngeal swab specimens and  should not be used as a sole basis for treatment. Nasal washings and  aspirates are unacceptable for Xpert Xpress SARS-CoV-2/FLU/RSV  testing. Fact Sheet for Patients: PinkCheek.be Fact Sheet for Healthcare Providers: GravelBags.it This test is not yet approved or cleared by the [place] FDA and  has been authorized for detection and/or diagnosis of SARS-CoV-2 by  FDA under an Emergency Use Authorization (EUA). This EUA will remain  in effect (meaning this test can be used) for the duration of the  Covid-19 declaration under Section 564(b)(1) of the Act, 21  U.S.C. section 360bbb-3(b)(1), unless the authorization is  terminated or revoked. Performed at [facility], [place] [address], [place], [place] [address]     Studies/Results: No results found.  Medications: Scheduled Meds: . cholecalciferol  2,000 Units Oral Daily  . folic acid  1 mg Oral Daily  . gabapentin  300 mg Oral TID  . HYDROmorphone   Intravenous Q4H  . hydroxyurea  500 mg Oral BID  . oxyCODONE  10 mg Oral Q4H while awake  . oxyCODONE  20 mg Oral Q12H  . senna-docusate  1 tablet Oral BID   Continuous Infusions: . diphenhydrAMINE     PRN Meds:.alum & mag hydroxide-simeth, diphenhydrAMINE **OR** diphenhydrAMINE, diphenhydrAMINE, ibuprofen, naloxone **AND** sodium chloride flush, [DISCONTINUED] ondansetron **OR** ondansetron (ZOFRAN) IV, ondansetron, polyethylene glycol  Consultants:  None  Procedures:  None  Antibiotics:  None  Assessment/Plan: Principal Problem:   Sickle cell anemia with crisis ([place]) Active Problems:   Sickle cell crisis ([place])   Other chronic pain   Anemia of chronic disease   Sickle-cell crisis ([place])  Sickle cell disease with pain crisis: Discontinue IV fluids.  Continue PCA Dilaudid, weaning today.  Settings changed to 0.5 mg, 10-minute lockout, and 2 mg/h. Oxycodone 10 mg every 4 hours while patient is awake for severe pain. Ibuprofen 600 mg every 6 hours as needed  Monitor vital signs closely, reevaluate pain scale regularly, and supplemental oxygen as needed.  Sickle cell anemia: Hemoglobin stable, consistent with patient's baseline.  There is no clinical indication for blood transfusion at this time.  Follow CBC.  Chronic pain syndrome: Continue home medications  Leukocytosis: Resolved.  Patient continues to be afebrile without signs of infection or inflammation.  Continue to monitor closely.  Code Status: Full Code Family Communication: N/A Disposition Plan: Not yet ready for discharge  [place], MSN, FNP-C Patient [place] [address] [place], [place]  [address] [phone]   If 5PM-8AM, please contact night-coverage.  03/26/2019, 8:03 PM  LOS: 2 days

## 2019-03-27 LAB — CBC
HCT: 22.7 % — ABNORMAL LOW (ref 39.0–52.0)
Hemoglobin: 8.1 g/dL — ABNORMAL LOW (ref 13.0–17.0)
MCH: 27.9 pg (ref 26.0–34.0)
MCHC: 35.7 g/dL (ref 30.0–36.0)
MCV: 78.3 fL — ABNORMAL LOW (ref 80.0–100.0)
Platelets: 388 10*3/uL (ref 150–400)
RBC: 2.9 MIL/uL — ABNORMAL LOW (ref 4.22–5.81)
RDW: 22.1 % — ABNORMAL HIGH (ref 11.5–15.5)
WBC: 16.1 10*3/uL — ABNORMAL HIGH (ref 4.0–10.5)
nRBC: 0.7 % — ABNORMAL HIGH (ref 0.0–0.2)

## 2019-03-27 MED ORDER — OXYCODONE-ACETAMINOPHEN 10-325 MG PO TABS: 1.0000 | ORAL_TABLET | Freq: Four times a day (QID) | ORAL | 0 refills | Status: DC | PRN

## 2019-03-27 MED ORDER — OXYCONTIN 20 MG PO T12A: 20.0000 mg | EXTENDED_RELEASE_TABLET | Freq: Two times a day (BID) | ORAL | 0 refills | Status: DC

## 2019-03-27 NOTE — Discharge Summary (Signed)
Physician Discharge Summary  Orien Kurtyka [id number] DOB: [birth year] DOA: 03/23/2019  PCP: Tresa Garter, MD  Admit date: 03/23/2019  Discharge date: 03/27/2019  Discharge Diagnoses:  Principal Problem:   Sickle cell anemia with crisis [facility]) Active Problems:   Sickle cell crisis ([place])   Other chronic pain   Anemia of chronic disease   Sickle-cell crisis ([place])   Discharge Condition: Stable  Disposition:  Follow-up Information    Tresa Garter, MD Follow up in 2 week(s).   Specialty: Internal Medicine Why: Lab appointment Contact information: [place] [address] [phone]          Pt is discharged home in good condition and is to follow up with Cammie Sickle, FNP in 2 weeks to have labs evaluated. Terrick Dankers is instructed to increase activity slowly and balance with rest for the next few days, and use prescribed medication to complete treatment of pain  Diet: Regular Wt Readings from Last 3 Encounters:  03/23/19 54 kg  03/23/19 65.7 kg  03/18/19 65.8 kg    History of present illness:  Yi Guilmette, a 29 year old male with a medical history significant for sickle cell disease type SS, chronic pain syndrome, opiate dependence and tolerance, history of oppositional defiance disorder, and history of anemia of chronic disease presented to ER with sickle cell pain not relieved with home medications.  Patient was admitted to sickle cell day infusion center 1 day prior to presenting for this problem.  He states that pain improved following treatment but returned late last night.  Also, patient attributes pain crisis to being out long-acting opiates.  He states that short acting on not effective.  He also says that this may be cold weather related.  He denies shortness of breath, nausea, vomiting, or diarrhea.  Patient has had 3 ER visits and day hospitalization in the past week.  He is referred for admission for pain control.  Emergency  room course: All labs and vital signs are reassuring.  Patient's pain persisted despite IV Dilaudid and IV fluids.  Patient admitted to [facility] for management of sickle cell pain crisis.  Hospital Course:  Sickle cell disease with pain crisis: Patient was admitted for sickle cell pain crisis and managed appropriately with IVF, IV Dilaudid via PCA and ibuprofen, as well as other adjunct therapies per sickle cell pain management protocols.  OxyContin was continued throughout admission without interruption.  Patient advised to resume home opiate medication regimen.  He is no longer being prescribed opiate medications by PCP due to varying inconsistencies.  He has been referred to pain management.  We will send a short course of medications to complete treatment.  Percocet 10-325 mg every 6 hours and OxyContin every 12 hours.  Reviewed PDMP prior to prescribing opiate medications. Patient advised to follow-up with pain management.  He says that he does not understand why I cannot continue writing his pain medications.  Advised patient to schedule an appointment for medication management.  Also, stressed the importance of establishing care with pain management.  Pain intensity 6/10 prior to discharge.  Patient is requesting discharge home.  He is alert, oriented, and ambulating without assistance.  Patient was discharged home today in a hemodynamically stable condition.   Discharge Exam: Vitals:   03/27/19 0752 03/27/19 1008  BP:  103/62  Pulse:  97  Resp: 15 16  Temp:  98.9 F (37.2 C)  SpO2: 95% 91%   Vitals:   03/27/19 0329 03/27/19 [id number]  03/27/19 0752 03/27/19 1008  BP:  119/69  103/62  Pulse:  (!) 101  97  Resp: 16 15 15 16   Temp:  98.6 F (37 C)  98.9 F (37.2 C)  TempSrc:  Oral  Oral  SpO2: 94% 93% 95% 91%  Weight:      Height:        General appearance : Awake, alert, not in any distress. Speech Clear. Not toxic looking HEENT: Atraumatic and Normocephalic, pupils equally  reactive to light and accomodation Neck: Supple, no JVD. No cervical lymphadenopathy.  Chest: Good air entry bilaterally, no added sounds  CVS: S1 S2 regular, no murmurs.  Abdomen: Bowel sounds present, Non tender and not distended with no gaurding, rigidity or rebound. Extremities: B/L Lower Ext shows no edema, both legs are warm to touch Neurology: Awake alert, and oriented X 3, CN II-XII intact, Non focal Skin: No Rash  Discharge Instructions  Discharge Instructions    Discharge patient   Complete by: As directed    Discharge disposition: 01-Home or Self Care   Discharge patient date: 03/27/2019     Allergies as of 03/27/2019      Reactions   Buprenorphine Hcl Hives   Levaquin [levofloxacin] Itching   Meperidine Rash   Morphine Hives   Morphine And Related Hives   Toradol [ketorolac Tromethamine] Itching   Tramadol Hives   Vancomycin Itching   Zosyn [piperacillin Sod-tazobactam So] Itching, Rash   Has taken rocephin in past      Medication List    TAKE these medications   cholecalciferol 25 MCG (1000 UNIT) tablet Commonly known as: VITAMIN D Take 2 tablets (2,000 Units total) by mouth daily.   Ensure Active High Protein Liqd Take 1 each by mouth 3 (three) times daily between meals. #90 cans of Ensure   folic acid 1 MG tablet Commonly known as: FOLVITE Take 1 tablet (1 mg total) by mouth daily.   gabapentin 300 MG capsule Commonly known as: NEURONTIN Take 1 capsule (300 mg total) by mouth 3 (three) times daily.   hydroxyurea 500 MG capsule Commonly known as: HYDREA Take 1 capsule (500 mg total) by mouth 2 (two) times daily. May take with food to minimize GI side effects.   ibuprofen 800 MG tablet Commonly known as: ADVIL Take 1 tablet (800 mg total) by mouth 3 (three) times daily as needed (pain).   oxyCODONE-acetaminophen 10-325 MG tablet Commonly known as: Percocet Take 1 tablet by mouth every 6 (six) hours as needed for pain.   OxyCONTIN 20 mg 12 hr  tablet Generic drug: oxyCODONE Take 1 tablet (20 mg total) by mouth every 12 (twelve) hours.       The results of significant diagnostics from this hospitalization (including imaging, microbiology, ancillary and laboratory) are listed below for reference.    Significant Diagnostic Studies: No results found.  Microbiology: Recent Results (from the past 240 hour(s))  Respiratory Panel by RT PCR (Flu A&B, Covid) - Nasopharyngeal Swab     Status: None   Collection Time: 03/23/19  9:15 PM   Specimen: Nasopharyngeal Swab  Result Value Ref Range Status   SARS Coronavirus 2 by RT PCR NEGATIVE NEGATIVE Final    Comment: (NOTE) SARS-CoV-2 target nucleic acids are NOT DETECTED. The SARS-CoV-2 RNA is generally detectable in upper respiratoy specimens during the acute phase of infection. The lowest concentration of SARS-CoV-2 viral copies this assay can detect is 131 copies/mL. A negative result does not preclude SARS-Cov-2 infection and should not  be used as the sole basis for treatment or other patient management decisions. A negative result may occur with  improper specimen collection/handling, submission of specimen other than nasopharyngeal swab, presence of viral mutation(s) within the areas targeted by this assay, and inadequate number of viral copies (<131 copies/mL). A negative result must be combined with clinical observations, patient history, and epidemiological information. The expected result is Negative. Fact Sheet for Patients:  PinkCheek.be Fact Sheet for Healthcare Providers:  GravelBags.it This test is not yet ap proved or cleared by the [place] FDA and  has been authorized for detection and/or diagnosis of SARS-CoV-2 by FDA under an Emergency Use Authorization (EUA). This EUA will remain  in effect (meaning this test can be used) for the duration of the COVID-19 declaration under Section 564(b)(1) of the  Act, 21 U.S.C. section 360bbb-3(b)(1), unless the authorization is terminated or revoked sooner.    Influenza A by PCR NEGATIVE NEGATIVE Final   Influenza B by PCR NEGATIVE NEGATIVE Final    Comment: (NOTE) The Xpert Xpress SARS-CoV-2/FLU/RSV assay is intended as an aid in  the diagnosis of influenza from Nasopharyngeal swab specimens and  should not be used as a sole basis for treatment. Nasal washings and  aspirates are unacceptable for Xpert Xpress SARS-CoV-2/FLU/RSV  testing. Fact Sheet for Patients: PinkCheek.be Fact Sheet for Healthcare Providers: GravelBags.it This test is not yet approved or cleared by the [place] FDA and  has been authorized for detection and/or diagnosis of SARS-CoV-2 by  FDA under an Emergency Use Authorization (EUA). This EUA will remain  in effect (meaning this test can be used) for the duration of the  Covid-19 declaration under Section 564(b)(1) of the Act, 21  U.S.C. section 360bbb-3(b)(1), unless the authorization is  terminated or revoked. Performed at [facility], [address] [place], [place] [address]      Labs: Basic Metabolic Panel: Recent Labs  Lab 03/22/19 0751 03/22/19 0751 03/23/19 0540 03/23/19 0540 03/23/19 2150 03/23/19 2150 03/25/19 0456 03/25/19 1133  NA 138  --  138  --  138  --  139  --   K 3.7   < > 3.5   < > 4.2   < > 5.2* 5.3*  CL 102  --  98  --  104  --  103  --   CO2 28  --  26  --  27  --  26  --   GLUCOSE 98  --  97  --  107*  --  87  --   BUN 7  --  7  --  6  --  9  --   CREATININE 0.59*  --  0.60*  --  0.59*  --  0.57*  --   CALCIUM 9.3  --  8.4*  --  8.8*  --  9.3  --    < > = values in this interval not displayed.   Liver Function Tests: Recent Labs  Lab 03/22/19 0751 03/23/19 0540 03/23/19 2150 03/25/19 0456  AST 26 25 31 28   ALT 15 13 13 13   ALKPHOS 69 70 69 69  BILITOT 2.6* 3.7* 2.2* 3.9*  PROT 7.9 7.8 7.7  7.6  ALBUMIN 4.5 4.3 4.1 4.1   No results for input(s): LIPASE, AMYLASE in the last 168 hours. No results for input(s): AMMONIA in the last 168 hours. CBC: Recent Labs  Lab 03/22/19 0751 03/22/19 0751 03/23/19 0540 03/23/19 2150 03/25/19 0456 03/26/19 0540 03/27/19 0730  WBC 10.1   < > 14.4* 14.6* 10.2 13.9* 16.1*  NEUTROABS 5.7  --  10.9* 11.5* 6.1  --   --   HGB 11.1*   < > 10.7* 10.3* 9.1* 8.9* 8.1*  HCT 33.0*   < > 31.2* 29.9* 26.8* 25.7* 22.7*  MCV 82.3   < > 81.7 81.9 81.0 80.8 78.3*  PLT 412*   < > 387 383 402* 383 388   < > = values in this interval not displayed.   Cardiac Enzymes: No results for input(s): CKTOTAL, CKMB, CKMBINDEX, TROPONINI in the last 168 hours. BNP: Invalid input(s): POCBNP CBG: No results for input(s): GLUCAP in the last 168 hours.  Time coordinating discharge: 40 minutes  Signed:  [name]  APRN, MSN, FNP-C [facility] [address] [place], [place] [address] [phone]   [facility] 03/27/2019, 4:23 PM

## 2019-03-27 NOTE — Discharge Instructions (Signed)
Sickle Cell Anemia, Adult  Sickle cell anemia is a condition where your red blood cells are shaped like sickles. Red blood cells carry oxygen through the body. Sickle-shaped cells do not live as long as normal red blood cells. They also clump together and block blood from flowing through the blood vessels. This prevents the body from getting enough oxygen. Sickle cell anemia causes organ damage and pain. It also increases the risk of infection. Follow these instructions at home: Medicines  Take over-the-counter and prescription medicines only as told by your doctor.  If you were prescribed an antibiotic medicine, take it as told by your doctor. Do not stop taking the antibiotic even if you start to feel better.  If you develop a fever, do not take medicines to lower the fever right away. Tell your doctor about the fever. Managing pain, stiffness, and swelling  Try these methods to help with pain: ? Use a heating pad. ? Take a warm bath. ? Distract yourself, such as by watching TV. Eating and drinking  Drink enough fluid to keep your pee (urine) clear or pale yellow. Drink more in hot weather and during exercise.  Limit or avoid alcohol.  Eat a healthy diet. Eat plenty of fruits, vegetables, whole grains, and lean protein.  Take vitamins and supplements as told by your doctor. Traveling  When traveling, keep these with you: ? Your medical information. ? The names of your doctors. ? Your medicines.  If you need to take an airplane, talk to your doctor first. Activity  Rest often.  Avoid exercises that make your heart beat much faster, such as jogging. General instructions  Do not use products that have nicotine or tobacco, such as cigarettes and e-cigarettes. If you need help quitting, ask your doctor.  Consider wearing a medical alert bracelet.  Avoid being in high places (high altitudes), such as mountains.  Avoid very hot or cold temperatures.  Avoid places where the  temperature changes a lot.  Keep all follow-up visits as told by your doctor. This is important. Contact a doctor if:  A joint hurts.  Your feet or hands hurt or swell.  You feel tired (fatigued). Get help right away if:  You have symptoms of infection. These include: ? Fever. ? Chills. ? Being very tired. ? Irritability. ? Poor eating. ? Throwing up (vomiting).  You feel dizzy or faint.  You have new stomach pain, especially on the left side.  You have a an erection (priapism) that lasts more than 4 hours.  You have numbness in your arms or legs.  You have a hard time moving your arms or legs.  You have trouble talking.  You have pain that does not go away when you take medicine.  You are short of breath.  You are breathing fast.  You have a long-term cough.  You have pain in your chest.  You have a bad headache.  You have a stiff neck.  Your stomach looks bloated even though you did not eat much.  Your skin is pale.  You suddenly cannot see well. Summary  Sickle cell anemia is a condition where your red blood cells are shaped like sickles.  Follow your doctor's advice on ways to manage pain, food to eat, activities to do, and steps to take for safe travel.  Get medical help right away if you have any signs of infection, such as a fever. This information is not intended to replace advice given to you by   your health care provider. Make sure you discuss any questions you have with your health care provider. Document Revised: 05/25/2018 Document Reviewed: 03/08/2016 Elsevier Patient Education  2020 Elsevier Inc.  

## 2019-03-27 NOTE — Progress Notes (Signed)
Pt discharged home in stable condition. Discharge instructions given. {t verbalized understanding.  Scripts sent to pharmacy of choice. No immediate questions or concerns at this time. Pt discharged from unit via wheelchair.

## 2019-03-29 ENCOUNTER — Telehealth: Payer: Self-pay | Admitting: Hematology

## 2019-03-29 ENCOUNTER — Telehealth (HOSPITAL_COMMUNITY): Payer: Self-pay | Admitting: General Practice

## 2019-03-29 ENCOUNTER — Telehealth (HOSPITAL_COMMUNITY): Payer: Self-pay | Admitting: *Deleted

## 2019-03-29 NOTE — Telephone Encounter (Signed)
Patient called stating Timothy Sickle FNP sent his pain medication to pharmacy. Since this provider is not his primary care physician, the pharmacy is asking him to pay out of pocket. Per provider, the day hospital does not have the capacity to treat the patient today, patient given the number to the number of [facility] agency  [phone]). Patient advised to call the agency to assist with the medication. Patient notified, verbalized understanding.

## 2019-03-29 NOTE — Telephone Encounter (Signed)
Patient was calling to advise that he was discharged from the hospital and was prescribed some pain medication.  He went to the pharmacy to pick it up and was told Timothy Marks is not listed on his insurance card.  Patient was a little upset that he had to keep going back and forth changing the name of his provider. / I explained that this is a [facility] policy, not the clinics policy.  I explained that maybe he should just call his new PCP and tell them he has bee discharged and needs the pain medication and let them prescribe it.  Patient verbalizes understanding.

## 2019-03-29 NOTE — Telephone Encounter (Signed)
Patient called requesting to come to the day infusion hospital for sickle cell pain. Patient discharged from the hospital this week. Reports back pain rated 9/10. Reports last taking Percocet last night but still "has a few left".  COVID-19 screening done and patient denies all symptoms and exposures. [place], [place] notified. Per provider, a prescription for Percocet was sent to patient's pharmacy upon discharge from the hospital. Patient advised to take pain medications around the clock as prescribed and to hydrate with 64 ounces of water. Patient given these instructions and expresses an understanding.

## 2019-03-30 ENCOUNTER — Emergency Department (HOSPITAL_COMMUNITY)
Admission: EM | Admit: 2019-03-30 | Discharge: 2019-03-30 | Disposition: A | Discharge status: Home / Self Care | Payer: Medicaid Other | Attending: Emergency Medicine | Admitting: Emergency Medicine

## 2019-03-30 ENCOUNTER — Other Ambulatory Visit: Payer: Self-pay

## 2019-03-30 ENCOUNTER — Encounter (HOSPITAL_COMMUNITY): Payer: Self-pay

## 2019-03-30 DIAGNOSIS — D57219 Sickle-cell/Hb-C disease with crisis, unspecified: Secondary | ICD-10-CM | POA: Insufficient documentation

## 2019-03-30 DIAGNOSIS — D5700 Hb-SS disease with crisis, unspecified: Secondary | ICD-10-CM

## 2019-03-30 DIAGNOSIS — M545 Low back pain: Secondary | ICD-10-CM | POA: Diagnosis present

## 2019-03-30 LAB — CBC WITH DIFFERENTIAL/PLATELET
Abs Immature Granulocytes: 0.48 10*3/uL — ABNORMAL HIGH (ref 0.00–0.07)
Basophils Absolute: 0.1 10*3/uL (ref 0.0–0.1)
Basophils Relative: 0 %
Eosinophils Absolute: 0.2 10*3/uL (ref 0.0–0.5)
Eosinophils Relative: 1 %
HCT: 20.3 % — ABNORMAL LOW (ref 39.0–52.0)
Hemoglobin: 6.9 g/dL — CL (ref 13.0–17.0)
Immature Granulocytes: 4 %
Lymphocytes Relative: 24 %
Lymphs Abs: 2.9 10*3/uL (ref 0.7–4.0)
MCH: 29.5 pg (ref 26.0–34.0)
MCHC: 34 g/dL (ref 30.0–36.0)
MCV: 86.8 fL (ref 80.0–100.0)
Monocytes Absolute: 1.3 10*3/uL — ABNORMAL HIGH (ref 0.1–1.0)
Monocytes Relative: 11 %
Neutro Abs: 7.6 10*3/uL (ref 1.7–7.7)
Neutrophils Relative %: 60 %
Platelets: 427 10*3/uL — ABNORMAL HIGH (ref 150–400)
RBC: 2.34 MIL/uL — ABNORMAL LOW (ref 4.22–5.81)
RDW: 30.6 % — ABNORMAL HIGH (ref 11.5–15.5)
WBC: 12.5 10*3/uL — ABNORMAL HIGH (ref 4.0–10.5)
nRBC: 32.9 % — ABNORMAL HIGH (ref 0.0–0.2)

## 2019-03-30 LAB — COMPREHENSIVE METABOLIC PANEL
ALT: 22 U/L (ref 0–44)
AST: 42 U/L — ABNORMAL HIGH (ref 15–41)
Albumin: 4.3 g/dL (ref 3.5–5.0)
Alkaline Phosphatase: 79 U/L (ref 38–126)
Anion gap: 9 (ref 5–15)
BUN: 9 mg/dL (ref 6–20)
CO2: 24 mmol/L (ref 22–32)
Calcium: 8.9 mg/dL (ref 8.9–10.3)
Chloride: 104 mmol/L (ref 98–111)
Creatinine, Ser: 0.55 mg/dL — ABNORMAL LOW (ref 0.61–1.24)
GFR calc Af Amer: >60 mL/min (ref >60)
GFR calc non Af Amer: >60 mL/min (ref >60)
Glucose, Bld: 102 mg/dL — ABNORMAL HIGH (ref 70–99)
Potassium: 3.8 mmol/L (ref 3.5–5.1)
Sodium: 137 mmol/L (ref 135–145)
Total Bilirubin: 3.1 mg/dL — ABNORMAL HIGH (ref 0.3–1.2)
Total Protein: 8.3 g/dL — ABNORMAL HIGH (ref 6.5–8.1)

## 2019-03-30 LAB — RETICULOCYTES
Immature Retic Fract: 29.1 % — ABNORMAL HIGH (ref 2.3–15.9)
RBC.: 2.33 MIL/uL — ABNORMAL LOW (ref 4.22–5.81)
Retic Count, Absolute: 84.6 10*3/uL (ref 19.0–186.0)
Retic Ct Pct: >30 % — ABNORMAL HIGH (ref 0.4–3.1)

## 2019-03-30 MED ORDER — HYDROMORPHONE HCL 2 MG/ML IJ SOLN
2.0000 mg | INTRAMUSCULAR | Status: AC
  Administered 2019-03-30: 2 mg via INTRAVENOUS
  Filled 2019-03-30: qty 1

## 2019-03-30 MED ORDER — DEXTROSE-NACL 5-0.45 % IV SOLN: INTRAVENOUS | Status: DC

## 2019-03-30 MED ORDER — HYDROMORPHONE HCL 2 MG/ML IJ SOLN
2.0000 mg | INTRAMUSCULAR | Status: AC
  Administered 2019-03-30: 10:00:00 2 mg via INTRAVENOUS
  Filled 2019-03-30: qty 1

## 2019-03-30 MED ORDER — DIPHENHYDRAMINE HCL 25 MG PO CAPS
25.0000 mg | ORAL_CAPSULE | ORAL | Status: DC | PRN
  Administered 2019-03-30: 09:00:00 25 mg via ORAL
  Filled 2019-03-30: qty 1

## 2019-03-30 MED ORDER — HYDROMORPHONE HCL 2 MG/ML IJ SOLN
2.0000 mg | Freq: Once | INTRAMUSCULAR | Status: AC
  Administered 2019-03-30: 2 mg via INTRAVENOUS
  Filled 2019-03-30: qty 1

## 2019-03-30 MED ORDER — ONDANSETRON HCL 4 MG/2ML IJ SOLN
4.0000 mg | INTRAMUSCULAR | Status: DC | PRN
  Administered 2019-03-30: 4 mg via INTRAVENOUS
  Filled 2019-03-30: qty 2

## 2019-03-30 MED ORDER — KETOROLAC TROMETHAMINE 15 MG/ML IJ SOLN
15.0000 mg | INTRAMUSCULAR | Status: DC
  Filled 2019-03-30: qty 1

## 2019-03-30 NOTE — ED Notes (Signed)
ED Provider at bedside. 

## 2019-03-30 NOTE — Discharge Instructions (Signed)
Please follow-up with your hematologist and return to the emergency department with any new or suddenly worsening symptoms.

## 2019-03-30 NOTE — ED Triage Notes (Signed)
Pt c/o sickle cell crisis. Pain worsening in lower back since DC on Thursday.

## 2019-03-30 NOTE — ED Provider Notes (Signed)
Emergency Department Provider Note   I have reviewed the triage vital signs and the nursing notes.   HISTORY  Chief Complaint Sickle Cell Pain Crisis and Back Pain   HPI Timothy Marks is a 29 y.o. male with PMH of SS disease presents to the emergency department for evaluation of lower back pain typical of his sickle cell crisis.  Patient describes lower back pain without weakness or numbness.  He is not experiencing chest pain or shortness of breath.  No fevers or chills.  He has had an interruption in his chronic oral pain medications at home due to an insurance card issue.  He tells me he contacted his new PCP and has it straightened out now but that since his most recent visit to the sickle cell day center his pain has returned.  He had pain through the night which seemed to be more severe this morning so presents to the ED for evaluation.  No radiation of symptoms or other modifying factors.   Past Medical History:  Diagnosis Date  . Depression   . 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 Sickle cell anemia (HCC)   . Vitamin D deficiency y-6    Patient Active Problem List   Diagnosis Date Noted  . Sickle-cell crisis ([place]) 03/24/2019  . SIRS (systemic inflammatory response syndrome) ([place]) 02/10/2019  . Abnormal liver function 11/04/2018  . Sickle cell anemia with crisis ([place]) 05/22/2018  . Acute bilateral low back pain without sciatica   . Other chronic pain   . Anemia of chronic disease   . 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 Physical deconditioning   . Agitation   . Chronic pain syndrome   . Recurrent cold sores   . Fever   . Pain   . Sickle cell crisis ([place]) 07/16/2017  . Adjustment disorder with mixed disturbance of emotions and conduct   . Constipation 08/03/2014  . 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 Protein-calorie malnutrition, severe ([place]) 04/09/2014  . Major depressive disorder, recurrent, severe without psychotic  features ([place])   . 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 Vitamin D deficiency   . Sickle cell anemia ([place]) 03/30/2014  . Hyperbilirubinemia 03/30/2014  . Hypokalemia 02/07/2014  . Aggressive behavior 01/25/2013  . Sickle cell anemia with pain ([place]) 01/24/2013  . Sickle cell pain crisis ([place]) 01/24/2013    Past Surgical History:  Procedure Laterality Date  . CHOLECYSTECTOMY      Allergies Buprenorphine hcl, Levaquin [levofloxacin], Meperidine, Morphine, Morphine and related, Toradol [ketorolac tromethamine], Tramadol, Vancomycin, and Zosyn [piperacillin sod-tazobactam so]  Family History  Problem Relation Age of Onset  . Sickle cell trait Mother   . Sickle cell trait Father     Social History Social History   Tobacco Use  . Smoking status: Never Smoker  . Smokeless tobacco: Never Used  Substance Use Topics  . Alcohol use: No  . Drug use: Not Currently    Types: Marijuana    Review of Systems  Constitutional: No fever/chills Eyes: No visual changes. ENT: No sore throat. Cardiovascular: Denies chest pain. Respiratory: Denies shortness of breath. Gastrointestinal: No abdominal pain.  No nausea, no vomiting.  No diarrhea.  No constipation. Genitourinary: Negative for dysuria. Musculoskeletal: Positive for back pain. Skin: Negative for rash. Neurological: Negative for headaches, focal weakness or numbness.  10-point ROS otherwise negative.  ____________________________________________   PHYSICAL EXAM:  VITAL SIGNS: ED Triage Vitals [03/30/19  0751]  Enc Vitals Group     BP 126/85     Pulse Rate (!) 101     Resp 20     Temp 97.9 F (36.6 C)     Temp Source Oral     SpO2 99 %   Constitutional: Alert and oriented. Well appearing and in no acute distress. Eyes: Conjunctivae are normal. Head: Atraumatic. Nose: No congestion/rhinnorhea. Mouth/Throat: Mucous membranes are moist.  Neck: No stridor.   Cardiovascular: Normal  rate, regular rhythm. Good peripheral circulation. Grossly normal heart sounds.   Respiratory: Normal respiratory effort.  No retractions. Lungs CTAB. Gastrointestinal:  No distention.  Musculoskeletal: No gross deformities of extremities. Neurologic:  Normal speech and language.  Skin:  Skin is warm, dry and intact. No rash noted.  ____________________________________________   LABS (all labs ordered are listed, but only abnormal results are displayed)  Labs Reviewed  CBC WITH DIFFERENTIAL/PLATELET - Abnormal; Notable for the following components:      Result Value   WBC 12.5 (*)    RBC 2.34 (*)    Hemoglobin 6.9 (*)    HCT 20.3 (*)    RDW 30.6 (*)    Platelets 427 (*)    nRBC 32.9 (*)    Monocytes Absolute 1.3 (*)    Abs Immature Granulocytes 0.48 (*)    All other components within normal limits  COMPREHENSIVE METABOLIC PANEL - Abnormal; Notable for the following components:   Glucose, Bld 102 (*)    Creatinine, Ser 0.55 (*)    Total Protein 8.3 (*)    AST 42 (*)    Total Bilirubin 3.1 (*)    All other components within normal limits  RETICULOCYTES - Abnormal; Notable for the following components:   Retic Ct Pct >30.0 (*)    RBC. 2.33 (*)    Immature Retic Fract 29.1 (*)    All other components within normal limits    ____________________________________________   PROCEDURES  Procedure(s) performed:   Procedures  None  ____________________________________________   INITIAL IMPRESSION / ASSESSMENT AND PLAN / ED COURSE  Pertinent labs & imaging results that were available during my care of the patient were reviewed by me and considered in my medical decision making (see chart for details).   Patient presents to the emergency department for evaluation of sickle cell crisis pain with pain in the lower back.  No evidence of infection, stroke, other sickle cell complication at this time.  He is not experiencing chest pain or shortness of breath.  Plan for pain  management here, screening labs, and reassess.  I reviewed his most recent ED and sickle cell day center visits. No ED care plan on file.   Labs with Hb of 6.9 and elevated retic count. Patient much improved after pain mgmt and IVF in the ED. Borderline requiring PRBC transfusion. Patient does not have symptomatic anemia symptoms at this time. Plan to follow as an outpatient. Discussed need for close Hematology follow up and repeat labs. Discussed ED return precautions.  ____________________________________________  FINAL CLINICAL IMPRESSION(S) / ED DIAGNOSES  Final diagnoses:  Sickle cell pain crisis ([place])     MEDICATIONS GIVEN DURING THIS VISIT:  Medications  dextrose 5 %-0.45 % sodium chloride infusion ( Intravenous Stopped 03/30/19 1057)  ketorolac (TORADOL) 15 MG/ML injection 15 mg (15 mg Intravenous Not Given 03/30/19 0907)  diphenhydrAMINE (BENADRYL) capsule 25-50 mg (25 mg Oral Given 03/30/19 0908)  ondansetron (ZOFRAN) injection 4 mg (4 mg Intravenous Given 03/30/19 0907)  HYDROmorphone (  DILAUDID) injection 2 mg (2 mg Intravenous Given 03/30/19 0908)  HYDROmorphone (DILAUDID) injection 2 mg (2 mg Intravenous Given 03/30/19 0941)  HYDROmorphone (DILAUDID) injection 2 mg (2 mg Intravenous Given 03/30/19 1007)    Note:  This document was prepared using Dragon voice recognition software and may include unintentional dictation errors.  Nanda Quinton, MD, [facility] Emergency Medicine    Greenleigh Kauth, [facility], MD 03/30/19 1524

## 2019-03-31 ENCOUNTER — Encounter (HOSPITAL_COMMUNITY): Payer: Self-pay | Admitting: Emergency Medicine

## 2019-03-31 ENCOUNTER — Inpatient Hospital Stay (HOSPITAL_COMMUNITY)
Admission: EM | Admit: 2019-03-31 | Discharge: 2019-04-03 | DRG: 812 | Disposition: A | Discharge status: Home / Self Care | Payer: Medicaid Other | Attending: Internal Medicine | Admitting: Internal Medicine

## 2019-03-31 DIAGNOSIS — G894 Chronic pain syndrome: Secondary | ICD-10-CM | POA: Diagnosis present

## 2019-03-31 DIAGNOSIS — F1120 Opioid dependence, uncomplicated: Secondary | ICD-10-CM | POA: Diagnosis present

## 2019-03-31 DIAGNOSIS — M549 Dorsalgia, unspecified: Secondary | ICD-10-CM | POA: Diagnosis present

## 2019-03-31 DIAGNOSIS — Z9981 Dependence on supplemental oxygen: Secondary | ICD-10-CM

## 2019-03-31 DIAGNOSIS — D5700 Hb-SS disease with crisis, unspecified: Principal | ICD-10-CM

## 2019-03-31 DIAGNOSIS — Z825 Family history of asthma and other chronic lower respiratory diseases: Secondary | ICD-10-CM

## 2019-03-31 DIAGNOSIS — R531 Weakness: Secondary | ICD-10-CM

## 2019-03-31 DIAGNOSIS — M79605 Pain in left leg: Secondary | ICD-10-CM | POA: Diagnosis present

## 2019-03-31 DIAGNOSIS — M879 Osteonecrosis, unspecified: Secondary | ICD-10-CM | POA: Diagnosis present

## 2019-03-31 DIAGNOSIS — M79604 Pain in right leg: Secondary | ICD-10-CM | POA: Diagnosis present

## 2019-03-31 DIAGNOSIS — Z79899 Other long term (current) drug therapy: Secondary | ICD-10-CM

## 2019-03-31 DIAGNOSIS — R451 Restlessness and agitation: Secondary | ICD-10-CM | POA: Diagnosis not present

## 2019-03-31 DIAGNOSIS — E559 Vitamin D deficiency, unspecified: Secondary | ICD-10-CM | POA: Diagnosis present

## 2019-03-31 DIAGNOSIS — D72829 Elevated white blood cell count, unspecified: Secondary | ICD-10-CM

## 2019-03-31 DIAGNOSIS — R1032 Left lower quadrant pain: Secondary | ICD-10-CM | POA: Diagnosis not present

## 2019-03-31 DIAGNOSIS — F329 Major depressive disorder, single episode, unspecified: Secondary | ICD-10-CM | POA: Diagnosis present

## 2019-03-31 DIAGNOSIS — D649 Anemia, unspecified: Secondary | ICD-10-CM

## 2019-03-31 DIAGNOSIS — Z20822 Contact with and (suspected) exposure to covid-19: Secondary | ICD-10-CM | POA: Diagnosis present

## 2019-03-31 DIAGNOSIS — Z832 Family history of diseases of the blood and blood-forming organs and certain disorders involving the immune mechanism: Secondary | ICD-10-CM

## 2019-03-31 DIAGNOSIS — D638 Anemia in other chronic diseases classified elsewhere: Secondary | ICD-10-CM

## 2019-03-31 DIAGNOSIS — F4320 Adjustment disorder, unspecified: Secondary | ICD-10-CM | POA: Diagnosis present

## 2019-03-31 DIAGNOSIS — E876 Hypokalemia: Secondary | ICD-10-CM | POA: Diagnosis present

## 2019-03-31 HISTORY — DX: Weakness: R53.1

## 2019-03-31 LAB — COMPREHENSIVE METABOLIC PANEL
ALT: 16 U/L (ref 0–44)
AST: 43 U/L — ABNORMAL HIGH (ref 15–41)
Albumin: 3.9 g/dL (ref 3.5–5.0)
Alkaline Phosphatase: 76 U/L (ref 38–126)
Anion gap: 9 (ref 5–15)
BUN: 5 mg/dL — ABNORMAL LOW (ref 6–20)
CO2: 26 mmol/L (ref 22–32)
Calcium: 8.4 mg/dL — ABNORMAL LOW (ref 8.9–10.3)
Chloride: 103 mmol/L (ref 98–111)
Creatinine, Ser: 0.57 mg/dL — ABNORMAL LOW (ref 0.61–1.24)
GFR calc Af Amer: >60 mL/min (ref >60)
GFR calc non Af Amer: >60 mL/min (ref >60)
Glucose, Bld: 110 mg/dL — ABNORMAL HIGH (ref 70–99)
Potassium: 3.1 mmol/L — ABNORMAL LOW (ref 3.5–5.1)
Sodium: 138 mmol/L (ref 135–145)
Total Bilirubin: 2.4 mg/dL — ABNORMAL HIGH (ref 0.3–1.2)
Total Protein: 7.5 g/dL (ref 6.5–8.1)

## 2019-03-31 LAB — PREPARE RBC (CROSSMATCH)

## 2019-03-31 LAB — CBC WITH DIFFERENTIAL/PLATELET
Abs Immature Granulocytes: 1.6 10*3/uL — ABNORMAL HIGH (ref 0.00–0.07)
Basophils Absolute: 0.1 10*3/uL (ref 0.0–0.1)
Basophils Relative: 0 %
Eosinophils Absolute: 0.2 10*3/uL (ref 0.0–0.5)
Eosinophils Relative: 1 %
HCT: 17 % — ABNORMAL LOW (ref 39.0–52.0)
Hemoglobin: 6 g/dL — CL (ref 13.0–17.0)
Immature Granulocytes: 7 %
Lymphocytes Relative: 17 %
Lymphs Abs: 3.7 10*3/uL (ref 0.7–4.0)
MCH: 31.3 pg (ref 26.0–34.0)
MCHC: 35.3 g/dL (ref 30.0–36.0)
MCV: 88.5 fL (ref 80.0–100.0)
Monocytes Absolute: 1.8 10*3/uL — ABNORMAL HIGH (ref 0.1–1.0)
Monocytes Relative: 8 %
Neutro Abs: 15.1 10*3/uL — ABNORMAL HIGH (ref 1.7–7.7)
Neutrophils Relative %: 67 %
Platelets: 392 10*3/uL (ref 150–400)
RBC: 1.92 MIL/uL — ABNORMAL LOW (ref 4.22–5.81)
RDW: 31.2 % — ABNORMAL HIGH (ref 11.5–15.5)
WBC: 22.6 10*3/uL — ABNORMAL HIGH (ref 4.0–10.5)
nRBC: 42.9 % — ABNORMAL HIGH (ref 0.0–0.2)

## 2019-03-31 LAB — RETICULOCYTES
Immature Retic Fract: 60.4 % — ABNORMAL HIGH (ref 2.3–15.9)
RBC.: 1.92 MIL/uL — ABNORMAL LOW (ref 4.22–5.81)
Retic Count, Absolute: 375 10*3/uL — ABNORMAL HIGH (ref 19.0–186.0)
Retic Ct Pct: 19.5 % — ABNORMAL HIGH (ref 0.4–3.1)

## 2019-03-31 LAB — SARS CORONAVIRUS 2 (TAT 6-24 HRS): SARS Coronavirus 2: NEGATIVE

## 2019-03-31 MED ORDER — HYDROMORPHONE HCL 2 MG/ML IJ SOLN
2.0000 mg | INTRAMUSCULAR | Status: AC
  Administered 2019-03-31: 03:00:00 2 mg via INTRAVENOUS

## 2019-03-31 MED ORDER — SODIUM CHLORIDE 0.9 % IV SOLN
25.0000 mg | INTRAVENOUS | Status: DC | PRN
  Filled 2019-03-31: qty 0.5

## 2019-03-31 MED ORDER — DIPHENHYDRAMINE HCL 25 MG PO CAPS: 25.0000 mg | ORAL_CAPSULE | ORAL | Status: DC | PRN

## 2019-03-31 MED ORDER — LORAZEPAM 2 MG/ML IJ SOLN
0.5000 mg | Freq: Once | INTRAMUSCULAR | Status: AC
  Administered 2019-03-31: 11:00:00 0.5 mg via INTRAVENOUS
  Filled 2019-03-31: qty 1

## 2019-03-31 MED ORDER — HYDROMORPHONE 1 MG/ML IV SOLN
INTRAVENOUS | Status: DC
  Administered 2019-03-31: 4.5 mL via INTRAVENOUS
  Administered 2019-03-31: 30 mg via INTRAVENOUS
  Administered 2019-03-31: 4 mg via INTRAVENOUS
  Administered 2019-03-31: 3.5 mg via INTRAVENOUS
  Administered 2019-03-31: 4 mg via INTRAVENOUS
  Administered 2019-04-01: 6.5 mg via INTRAVENOUS
  Administered 2019-04-01: 4 mg via INTRAVENOUS
  Administered 2019-04-01: 7 mg via INTRAVENOUS
  Administered 2019-04-01: 30 mg via INTRAVENOUS
  Administered 2019-04-01: 6 mg via INTRAVENOUS
  Administered 2019-04-01: 30 mg via INTRAVENOUS
  Administered 2019-04-02: 5.5 mg via INTRAVENOUS
  Administered 2019-04-02: 7 mg via INTRAVENOUS
  Administered 2019-04-02: 1.5 mg via INTRAVENOUS
  Filled 2019-03-31 (×3): qty 30

## 2019-03-31 MED ORDER — ENOXAPARIN SODIUM 40 MG/0.4ML ~~LOC~~ SOLN
40.0000 mg | SUBCUTANEOUS | Status: DC
  Filled 2019-03-31: qty 0.4

## 2019-03-31 MED ORDER — SODIUM CHLORIDE 0.9 % IV SOLN
10.0000 mL/h | Freq: Once | INTRAVENOUS | Status: AC
  Administered 2019-04-01: 10 mL/h via INTRAVENOUS

## 2019-03-31 MED ORDER — DIAZEPAM 5 MG/ML IJ SOLN
5.0000 mg | Freq: Once | INTRAMUSCULAR | Status: AC
  Administered 2019-03-31: 5 mg via INTRAVENOUS
  Filled 2019-03-31: qty 2

## 2019-03-31 MED ORDER — KETAMINE HCL 50 MG/5ML IJ SOSY
0.3000 mg/kg | PREFILLED_SYRINGE | Freq: Once | INTRAMUSCULAR | Status: AC
  Administered 2019-03-31: 02:00:00 16 mg via INTRAVENOUS
  Filled 2019-03-31: qty 5

## 2019-03-31 MED ORDER — SODIUM CHLORIDE 0.45 % IV SOLN: INTRAVENOUS | Status: DC

## 2019-03-31 MED ORDER — KETOROLAC TROMETHAMINE 15 MG/ML IJ SOLN
15.0000 mg | INTRAMUSCULAR | Status: AC
  Administered 2019-03-31: 02:00:00 15 mg via INTRAVENOUS
  Filled 2019-03-31: qty 1

## 2019-03-31 MED ORDER — ONDANSETRON HCL 4 MG/2ML IJ SOLN
4.0000 mg | INTRAMUSCULAR | Status: DC | PRN
  Administered 2019-03-31: 15:00:00 4 mg via INTRAVENOUS
  Filled 2019-03-31: qty 2

## 2019-03-31 MED ORDER — DIPHENHYDRAMINE HCL 25 MG PO CAPS
25.0000 mg | ORAL_CAPSULE | ORAL | Status: DC | PRN
  Administered 2019-03-31: 03:00:00 25 mg via ORAL
  Filled 2019-03-31: qty 1

## 2019-03-31 MED ORDER — POTASSIUM CHLORIDE 10 MEQ/100ML IV SOLN
10.0000 meq | INTRAVENOUS | Status: AC
  Administered 2019-03-31 (×2): 10 meq via INTRAVENOUS
  Filled 2019-03-31 (×2): qty 100

## 2019-03-31 MED ORDER — HYDROMORPHONE HCL 2 MG/ML IJ SOLN
2.0000 mg | INTRAMUSCULAR | Status: AC
  Administered 2019-03-31: 04:00:00 2 mg via INTRAVENOUS
  Filled 2019-03-31 (×2): qty 1

## 2019-03-31 MED ORDER — NALOXONE HCL 0.4 MG/ML IJ SOLN: 0.4000 mg | INTRAMUSCULAR | Status: DC | PRN

## 2019-03-31 MED ORDER — HYDROMORPHONE HCL 1 MG/ML IJ SOLN
1.0000 mg | INTRAMUSCULAR | Status: DC | PRN
  Administered 2019-03-31 – 2019-04-01 (×8): 1 mg via INTRAVENOUS
  Filled 2019-03-31 (×10): qty 1

## 2019-03-31 MED ORDER — LORAZEPAM 2 MG/ML IJ SOLN
1.0000 mg | Freq: Once | INTRAMUSCULAR | Status: AC
  Administered 2019-03-31: 20:00:00 1 mg via INTRAVENOUS
  Filled 2019-03-31: qty 1

## 2019-03-31 MED ORDER — HYDROMORPHONE HCL 1 MG/ML IJ SOLN
1.0000 mg | Freq: Once | INTRAMUSCULAR | Status: AC
  Administered 2019-03-31: 06:00:00 1 mg via INTRAVENOUS
  Filled 2019-03-31: qty 1

## 2019-03-31 MED ORDER — ONDANSETRON HCL 4 MG/2ML IJ SOLN: 4.0000 mg | Freq: Four times a day (QID) | INTRAMUSCULAR | Status: DC | PRN

## 2019-03-31 MED ORDER — ONDANSETRON HCL 4 MG PO TABS: 4.0000 mg | ORAL_TABLET | ORAL | Status: DC | PRN

## 2019-03-31 MED ORDER — KETOROLAC TROMETHAMINE 15 MG/ML IJ SOLN
15.0000 mg | Freq: Four times a day (QID) | INTRAMUSCULAR | Status: DC
  Administered 2019-03-31 – 2019-04-03 (×13): 15 mg via INTRAVENOUS
  Filled 2019-03-31 (×14): qty 1

## 2019-03-31 MED ORDER — SODIUM CHLORIDE 0.9% FLUSH: 9.0000 mL | INTRAVENOUS | Status: DC | PRN

## 2019-03-31 NOTE — Progress Notes (Signed)
Admitted early this morning with sickle cell painful crisis, hemolytic crisis and hypokalemia.  Patient receiving 1 unit of packed red blood cells now.  He is generally still complained of significant pain.  Refused IM Dilaudid as offered today.  Continue Dilaudid PCA after transfusion.  Also continue as needed Dilaudid as ordered.  We will monitor H&H and continue with pain management.

## 2019-03-31 NOTE — ED Triage Notes (Signed)
Pt comes to ed via ems, c/o SCC, pt has increased pain in his back and and arms, pain 10 out 10. Pts pain has not stopped since his discharge from hospital this morning. Pt is alert x 4, pt tried his home dose and it did not work for him. Pt v/s on arrival bp 120/50 pluses 100, spo2 4 liters, cbg 159 , temp 99.2.   pt normally on room air, ems placed 02 for comfort measures.

## 2019-03-31 NOTE — H&P (Signed)
History and Physical    Timothy Marks [id number] DOB: 1990-10-18 DOA: 03/31/2019  PCP: Tresa Garter, MD (Confirm with patient/family/NH records and if not entered, this has to be entered at [facility] point of entry)  Patient coming from: Home  I have personally briefly reviewed patient's old medical records in [place]  Chief Complaint: Worsening pain in the back and extremities.  HPI: Timothy Marks is a 29 y.o. male with medical history significant of sickle cell anemia, osteonecrosis and depression presented to ED for evaluation of worsening back pain and pain in all 4 extremities.  Patient states that he was seen and evaluated yesterday morning for the same issue and was discharged home from the ED with pain medications.  Patient states that his condition did not improve since he was discharged home and continue to worsen.  Patient states that pain was 10 out of 10 on arrival to the hospital but right now it is 6 out of 10 on pain scale, located in lower back, both arms and both legs.  Patient also complains of severe generalized weakness.  Patient otherwise denies fever, chills, cough, chest pain, shortness of breath, abdominal pain, nausea, vomiting and urinary symptoms.  Patient also denies any contact with sick individuals infected with COVID-19 virus recently.  ED Course: On arrival to the ED patient had blood pressure of 120/50, heart rate 100, temperature 99.2 and oxygen saturation of 100% on 4 L.  Patient does not use oxygen at baseline but EMS placed the patient on oxygen for comfort measures.  Blood work showed WBC count of 22.6, hemoglobin 6.0, potassium 3.1, bilirubin 2.4.  Covid PCR is ordered and result is pending.  Review of Systems: As per HPI otherwise 10 point review of systems negative.    Past Medical History:  Diagnosis Date  . Depression   . Generalized weakness 03/31/2019  . 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 Sickle cell anemia (HCC)   . Vitamin D deficiency y-6    Past Surgical History:  Procedure Laterality Date  . CHOLECYSTECTOMY       reports that he has never smoked. He has never used smokeless tobacco. He reports previous drug use. Drug: Marijuana. He reports that he does not drink alcohol.  Allergies  Allergen Reactions  . Buprenorphine Hcl Hives  . Levaquin [Levofloxacin] Itching  . Meperidine Rash  . Morphine Hives  . Morphine And Related Hives  . Toradol [Ketorolac Tromethamine] Itching  . Tramadol Hives  . Vancomycin Itching  . Zosyn [Piperacillin Sod-Tazobactam So] Itching and Rash    Has taken rocephin in past    Family History  Problem Relation Age of Onset  . Sickle cell trait Mother   . Sickle cell trait Father   Sister has asthma  Prior to Admission medications   Medication Sig Start Date End Date Taking? Authorizing Provider  cholecalciferol (VITAMIN D) 1000 units tablet Take 2 tablets (2,000 Units total) by mouth daily. 12/13/17  Yes Azzie Glatter, FNP  folic acid (FOLVITE) 1 MG tablet Take 1 tablet (1 mg total) by mouth daily. 05/01/17  Yes Dorena Dew, FNP  gabapentin (NEURONTIN) 300 MG capsule Take 1 capsule (300 mg total) by mouth 3 (three) times daily. 03/12/19  Yes Dorena Dew, FNP  hydroxyurea (HYDREA) 500 MG capsule Take 1 capsule (500 mg total) by mouth 2 (two) times daily. May take with food to minimize GI side effects.  05/01/17  Yes Dorena Dew, FNP  ibuprofen (ADVIL) 800 MG tablet Take 1 tablet (800 mg total) by mouth 3 (three) times daily as needed (pain). 03/12/19  Yes Dorena Dew, FNP  Nutritional Supplements (ENSURE ACTIVE HIGH PROTEIN) LIQD Take 1 each by mouth 3 (three) times daily between meals. #90 cans of Ensure 11/13/18  Yes Dorena Dew, FNP  oxyCODONE-acetaminophen (PERCOCET) 10-325 MG tablet Take 1 tablet by mouth every 6 (six) hours as needed for pain. 03/27/19 03/26/20 Yes Dorena Dew, FNP  OXYCONTIN 20 MG 12 hr tablet Take 1 tablet (20 mg total) by mouth every 12 (twelve) hours. 03/27/19  Yes Dorena Dew, FNP    Physical Exam: Vitals:   03/31/19 0245 03/31/19 0300 03/31/19 0301 03/31/19 0315  BP: 121/64 123/71 123/71 123/60  Pulse: 90 87 86 79  Resp: (!) 22 19 19 15   Temp:      TempSrc:      SpO2: 100% 100% 100% 100%  Weight:      Height:        Constitutional: NAD, calm, comfortable Vitals:   03/31/19 0245 03/31/19 0300 03/31/19 0301 03/31/19 0315  BP: 121/64 123/71 123/71 123/60  Pulse: 90 87 86 79  Resp: (!) 22 19 19 15   Temp:      TempSrc:      SpO2: 100% 100% 100% 100%  Weight:      Height:       General: Patient is a 29 year old sick looking African-American male laying comfortably in the bed. Eyes: PERRL, lids and conjunctivae normal ENMT: Mucous membranes are moist. Posterior pharynx clear of any exudate or lesions.Normal dentition.  Neck: normal, supple, no masses, no thyromegaly Respiratory: clear to auscultation bilaterally, no wheezing, no crackles. Normal respiratory effort. No accessory muscle use.  Cardiovascular: Regular rate and rhythm, no murmurs / rubs / gallops. No extremity edema. 2+ pedal pulses. No carotid bruits.  Abdomen: no tenderness, no masses palpated. No hepatosplenomegaly. Bowel sounds positive.  Musculoskeletal: no clubbing / cyanosis. No joint deformity upper and lower extremities. Good ROM, no contractures. Normal muscle tone.  Skin: no rashes, lesions, ulcers. No induration Neurologic: CN 2-12 grossly intact. Sensation intact, DTR normal. Strength 5/5 in all 4.  Psychiatric: Normal judgment and insight. Alert and oriented x 3. Normal mood.    Labs on Admission: I have personally reviewed following labs and imaging studies  CBC: Recent Labs  Lab 03/25/19 0456 03/26/19 0540 03/27/19 0730 03/30/19 0833 03/31/19 0129  WBC 10.2 13.9* 16.1* 12.5* 22.6*  NEUTROABS 6.1  --   --  7.6 15.1*  HGB 9.1*  8.9* 8.1* 6.9* 6.0*  HCT 26.8* 25.7* 22.7* 20.3* 17.0*  MCV 81.0 80.8 78.3* 86.8 88.5  PLT 402* 383 388 427* 0000000   Basic Metabolic Panel: Recent Labs  Lab 03/25/19 0456 03/25/19 1133 03/30/19 0833 03/31/19 0129  NA 139  --  137 138  K 5.2* 5.3* 3.8 3.1*  CL 103  --  104 103  CO2 26  --  24 26  GLUCOSE 87  --  102* 110*  BUN 9  --  9 5*  CREATININE 0.57*  --  0.55* 0.57*  CALCIUM 9.3  --  8.9 8.4*   GFR: Estimated Creatinine Clearance: 103.9 mL/min (A) (by C-G formula based on SCr of 0.57 mg/dL (L)). Liver Function Tests: Recent Labs  Lab 03/25/19 0456 03/30/19 0833 03/31/19 0129  AST 28 42* 43*  ALT 13 22 16   ALKPHOS 69  79 76  BILITOT 3.9* 3.1* 2.4*  PROT 7.6 8.3* 7.5  ALBUMIN 4.1 4.3 3.9   No results for input(s): LIPASE, AMYLASE in the last 168 hours. No results for input(s): AMMONIA in the last 168 hours. Coagulation Profile: No results for input(s): INR, PROTIME in the last 168 hours. Cardiac Enzymes: No results for input(s): CKTOTAL, CKMB, CKMBINDEX, TROPONINI in the last 168 hours. BNP (last 3 results) No results for input(s): PROBNP in the last 8760 hours. HbA1C: No results for input(s): HGBA1C in the last 72 hours. CBG: No results for input(s): GLUCAP in the last 168 hours. Lipid Profile: No results for input(s): CHOL, HDL, LDLCALC, TRIG, CHOLHDL, LDLDIRECT in the last 72 hours. Thyroid Function Tests: No results for input(s): TSH, T4TOTAL, FREET4, T3FREE, THYROIDAB in the last 72 hours. Anemia Panel: Recent Labs    03/30/19 0823 03/31/19 0129  RETICCTPCT >30.0* 19.5*   Urine analysis:    Component Value Date/Time   COLORURINE YELLOW 03/18/2019 1236   APPEARANCEUR CLEAR 03/18/2019 1236   LABSPEC 1.010 03/18/2019 1236   PHURINE 9.0 (H) 03/18/2019 1236   GLUCOSEU NEGATIVE 03/18/2019 1236   HGBUR NEGATIVE 03/18/2019 1236   BILIRUBINUR NEGATIVE 03/18/2019 1236   BILIRUBINUR neg 01/15/2019 1406   KETONESUR NEGATIVE 03/18/2019 1236   PROTEINUR  NEGATIVE 03/18/2019 1236   UROBILINOGEN 4.0 (A) 01/15/2019 1406   UROBILINOGEN 2.0 (H) 05/01/2017 0955   NITRITE NEGATIVE 03/18/2019 1236   LEUKOCYTESUR NEGATIVE 03/18/2019 1236    Radiological Exams on Admission: No results found.    Assessment/Plan Principal Problem:    Sickle cell anemia with crisis [facility]) Patient is managed in ED with IV Toradol, IV Dilaudid, IV ketamine and half NS at the rate of 125 mL/h. Continue half NS at rate of 125 mL/h.  IV Toradol 15 mg every 6 hours as needed for moderate pain while IV Dilaudid 1 mg every 4 hours as needed for severe pain ordered. Hemoglobin dropped from 6.9 to 6.0 whereas the baseline hemoglobin is above 8.0.  Blood transfusion ordered. Continue to monitor hemoglobin 1 hour after the blood transfusion is complete. Patient is also counseled about medication compliance and fluid intake to keep himself hydrated. Sickle cell medicine consult ordered for evaluation.  Active Problems:   Hypokalemia Potassium is repleted in the ED. Continue to monitor potassium level and replete potassium as needed.    Hyperbilirubinemia Patient has a bilirubin level of 2.4 but it is improving from 3.1 yesterday. Continue to monitor    Leukocytosis Patient has leukocytosis with WBC count of 22.6 but patient denies any symptoms of infection including fever, sore throat, cough, diarrhea, dysuria or any skin infection. No indication for antibiotic at this time. Continue to monitor CBC      Generalized weakness Generalized weakness is most likely from acute anemia in the setting of sickle cell crisis. Blood transfusion ordered and will continue to monitor hemoglobin level.    DVT prophylaxis: Lovenox  Code Status: Full code  Disposition Plan: Patient will be discharged to home after his symptoms improve   Consults called: Sickle cell medicine consult ordered for evaluation and further recommendations  Admission status:  Observation/MedSurg   Edmonia Lynch MD [facility] Pager [phone]  If 7PM-7AM, please contact night-coverage [email or web address] Password   03/31/2019, 4:00 AM

## 2019-03-31 NOTE — Progress Notes (Addendum)
   Vital Signs MEWS/VS Documentation      03/31/2019 0919 03/31/2019 0928 03/31/2019 1117 03/31/2019 1438   MEWS Score:  1  1  0  3   MEWS Score Color:  Green  Green  Green  Yellow   Resp:  (!) 21  (!) 21  16  (!) 21   Pulse:  --  92  --  (!) 115   BP:  --  111/60  --  121/72   Temp:  --  98.1 F (36.7 C)  --  98.4 F (36.9 C)   O2 Device:  Room Air  Room Air  --  Room Air   Level of Consciousness:  --  Alert  --  --      Dr. Jonelle Sidle notified, patient stable, no signs of distress, no new orders. Will continue to monitor patient. [place] implemented.    Timothy Marks 03/31/2019,3:08 PM

## 2019-03-31 NOTE — ED Provider Notes (Addendum)
[place] DEPT Provider Note   CSN: [id number] Arrival date & time: 03/31/19  0119     History Chief Complaint  Patient presents with  . Sickle Cell Pain Crisis    Timothy Marks is a 29 y.o. male.  HPI     This is a 29 year old male with a history of sickle cell anemia, osteonecrosis, physical deconditioning who presents with sickle cell pain.  Patient was seen and evaluated yesterday morning for the same.  At that time had blood work which showed worsening anemia and an elevated reticulocyte count.  He had significant improvement of his pain was discharged home.  He states he has been using his home had medications including ibuprofen, Percocet, and OxyContin but has continued to have worsening pain throughout the day.  He reports pain all over which is consistent with his prior sickle cell pain.  Reports that it is achy in nature.  He denies fever, cough, shortness of breath, or chest pain.  He reports that he has not required a blood transfusion in some time.  Currently he rates his pain at 10 out of 10.  Past Medical History:  Diagnosis Date  . Depression   . 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 Sickle cell anemia (HCC)   . Vitamin D deficiency y-6    Patient Active Problem List   Diagnosis Date Noted  . Sickle-cell crisis ([place]) 03/24/2019  . SIRS (systemic inflammatory response syndrome) ([place]) 02/10/2019  . Abnormal liver function 11/04/2018  . Sickle cell anemia with crisis ([place]) 05/22/2018  . Acute bilateral low back pain without sciatica   . Other chronic pain   . Anemia of chronic disease   . 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 Physical deconditioning   . Agitation   . Chronic pain syndrome   . Recurrent cold sores   . Fever   . Pain   . Sickle cell crisis ([place]) 07/16/2017  . Adjustment disorder with mixed disturbance of emotions and conduct   . Constipation 08/03/2014  .  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 Protein-calorie malnutrition, severe ([place]) 04/09/2014  . Major depressive disorder, recurrent, severe without psychotic features ([place])   . 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 Vitamin D deficiency   . Sickle cell anemia ([place]) 03/30/2014  . Hyperbilirubinemia 03/30/2014  . Hypokalemia 02/07/2014  . Aggressive behavior 01/25/2013  . Sickle cell anemia with pain ([place]) 01/24/2013  . Sickle cell pain crisis ([place]) 01/24/2013    Past Surgical History:  Procedure Laterality Date  . CHOLECYSTECTOMY         Family History  Problem Relation Age of Onset  . Sickle cell trait Mother   . Sickle cell trait Father     Social History   Tobacco Use  . Smoking status: Never Smoker  . Smokeless tobacco: Never Used  Substance Use Topics  . Alcohol use: No  . Drug use: Not Currently    Types: Marijuana    Home Medications Prior to Admission medications   Medication Sig Start Date End Date Taking? Authorizing Provider  cholecalciferol (VITAMIN D) 1000 units tablet Take 2 tablets (2,000 Units total) by mouth daily. 12/13/17   Azzie Glatter, FNP  folic acid (FOLVITE) 1 MG tablet Take 1 tablet (1 mg total) by mouth daily. 05/01/17   Dorena Dew, FNP  gabapentin (NEURONTIN) 300 MG capsule Take 1 capsule (300 mg total) by  mouth 3 (three) times daily. 03/12/19   Dorena Dew, FNP  hydroxyurea (HYDREA) 500 MG capsule Take 1 capsule (500 mg total) by mouth 2 (two) times daily. May take with food to minimize GI side effects. 05/01/17   Dorena Dew, FNP  ibuprofen (ADVIL) 800 MG tablet Take 1 tablet (800 mg total) by mouth 3 (three) times daily as needed (pain). 03/12/19   Dorena Dew, FNP  Nutritional Supplements (ENSURE ACTIVE HIGH PROTEIN) LIQD Take 1 each by mouth 3 (three) times daily between meals. #90 cans of Ensure 11/13/18   Dorena Dew, FNP  oxyCODONE-acetaminophen (PERCOCET) 10-325 MG tablet Take 1  tablet by mouth every 6 (six) hours as needed for pain. 03/27/19 03/26/20  Dorena Dew, FNP  OXYCONTIN 20 MG 12 hr tablet Take 1 tablet (20 mg total) by mouth every 12 (twelve) hours. 03/27/19   Dorena Dew, FNP    Allergies    Buprenorphine hcl, Levaquin [levofloxacin], Meperidine, Morphine, Morphine and related, Toradol [ketorolac tromethamine], Tramadol, Vancomycin, and Zosyn [piperacillin sod-tazobactam so]  Review of Systems   Review of Systems  Constitutional: Negative for fever.  Respiratory: Negative for cough and shortness of breath.   Cardiovascular: Negative for chest pain.  Gastrointestinal: Negative for abdominal pain, nausea and vomiting.  Genitourinary: Negative for dysuria.  Musculoskeletal:       Full body pain  Neurological: Negative for dizziness.  All other systems reviewed and are negative.   Physical Exam Updated Vital Signs BP 133/66 (BP Location: Right Arm)   Pulse 98   Temp 97.9 F (36.6 C) (Oral)   Resp (!) 26   Ht 1.88 m (6\' 2" )   Wt 54.4 kg   SpO2 98%   BMI 15.41 kg/m   Physical Exam Vitals and nursing note reviewed.  Constitutional:      Appearance: He is well-developed.     Comments: Chronically ill-appearing, thin  HENT:     Head: Normocephalic and atraumatic.     Mouth/Throat:     Comments: Dry Eyes:     Pupils: Pupils are equal, round, and reactive to light.  Cardiovascular:     Rate and Rhythm: Normal rate and regular rhythm.     Heart sounds: Normal heart sounds. No murmur.  Pulmonary:     Effort: Pulmonary effort is normal. No respiratory distress.     Breath sounds: Normal breath sounds. No wheezing.  Abdominal:     Palpations: Abdomen is soft.     Tenderness: There is no abdominal tenderness.  Musculoskeletal:        General: No swelling or deformity.     Cervical back: Neck supple.  Skin:    General: Skin is warm and dry.  Neurological:     Mental Status: He is alert and oriented to person, place, and time.    Psychiatric:        Mood and Affect: Mood normal.     ED Results / Procedures / Treatments   Labs (all labs ordered are listed, but only abnormal results are displayed) Labs Reviewed  CBC WITH DIFFERENTIAL/PLATELET - Abnormal; Notable for the following components:      Result Value   WBC 22.6 (*)    RBC 1.92 (*)    Hemoglobin 6.0 (*)    HCT 17.0 (*)    RDW 31.2 (*)    nRBC 42.9 (*)    Neutro Abs 15.1 (*)    Monocytes Absolute 1.8 (*)    Abs Immature  Granulocytes 1.60 (*)    All other components within normal limits  COMPREHENSIVE METABOLIC PANEL - Abnormal; Notable for the following components:   Potassium 3.1 (*)    Glucose, Bld 110 (*)    BUN 5 (*)    Creatinine, Ser 0.57 (*)    Calcium 8.4 (*)    AST 43 (*)    Total Bilirubin 2.4 (*)    All other components within normal limits  RETICULOCYTES - Abnormal; Notable for the following components:   Retic Ct Pct 19.5 (*)    RBC. 1.92 (*)    Retic Count, Absolute 375.0 (*)    Immature Retic Fract 60.4 (*)    All other components within normal limits  SARS CORONAVIRUS 2 (TAT 6-24 HRS)  TYPE AND SCREEN    EKG None  Radiology No results found.  Procedures Procedures (including critical care time)  CRITICAL CARE Performed by: Merryl Hacker   Total critical care time: 35 minutes  Critical care time was exclusive of separately billable procedures and treating other patients.  Critical care was necessary to treat or prevent imminent or life-threatening deterioration.  Critical care was time spent personally by me on the following activities: development of treatment plan with patient and/or surrogate as well as nursing, discussions with consultants, evaluation of patient's response to treatment, examination of patient, obtaining history from patient or surrogate, ordering and performing treatments and interventions, ordering and review of laboratory studies, ordering and review of radiographic studies, pulse  oximetry and re-evaluation of patient's condition.   Medications Ordered in ED Medications  0.45 % sodium chloride infusion ( Intravenous New Bag/Given 03/31/19 0225)  diphenhydrAMINE (BENADRYL) capsule 25-50 mg (25 mg Oral Given 03/31/19 0251)  HYDROmorphone (DILAUDID) injection 2 mg (0 mg Intravenous Hold 03/31/19 0241)  potassium chloride 10 mEq in 100 mL IVPB (has no administration in time range)  ketamine 50 mg in normal saline 5 mL (10 mg/mL) syringe (16 mg Intravenous Given 03/31/19 0227)  ketorolac (TORADOL) 15 MG/ML injection 15 mg (15 mg Intravenous Given 03/31/19 0223)  HYDROmorphone (DILAUDID) injection 2 mg (2 mg Intravenous Given 03/31/19 0251)    ED Course  I have reviewed the triage vital signs and the nursing notes.  Pertinent labs & imaging results that were available during my care of the patient were reviewed by me and considered in my medical decision making (see chart for details).    MDM Rules/Calculators/A&P                       Patient presents with continued sickle cell pain.  Was seen less than 24 hours ago with the same but had improved pain.  At that time he was noted to have a downtrending hemoglobin but was clinically stable.  He is chronically ill-appearing but nontoxic on exam.  He appears uncomfortable.  He is afebrile.  He denies infectious symptoms, fever, cough, shortness of breath, or chest pain.  Lab work repeated including type and screen.  Hemoglobin is now down to 6.0.  He is unsure of his transfusion threshold but it appears his most recent baselines have been hemoglobins of between 8 and 9.  White blood cell count is elevated to 22.6 but he denies infectious symptoms.  Reticulocytes remain persistently.  Patient was given fluids, IV ketamine, and as needed Dilaudid.  Additionally he was replaced potassium for potassium of 3.1.  Given worsening anemia and ongoing pain, will admit for sickle cell crisis.  Will defer  transfusion to hospitalist.  Covid  testing pending.  3:17 AM Discussed with admitting hospitalist, Dr. Humphrey Rolls.  Plan for transfusion.    Final Clinical Impression(s) / ED Diagnoses Final diagnoses:  Sickle cell pain crisis ([place])  Anemia, unspecified type    Rx / DC Orders ED Discharge Orders    None       Grove Defina, Barbette Hair, MD 03/31/19 0300    Merryl Hacker, MD 03/31/19 [phone]

## 2019-03-31 NOTE — ED Notes (Signed)
PT's vital signed 108mins post ketamine per order

## 2019-03-31 NOTE — ED Notes (Signed)
Date and time results received: 03/31/19 2:44 AM  (use smartphrase ".now" to insert current time)  Test: Hemoglobin Critical Value: 6.0  Name of Provider Notified: Dr.Horton  Orders Received? Or Actions Taken?:

## 2019-04-01 DIAGNOSIS — M879 Osteonecrosis, unspecified: Secondary | ICD-10-CM | POA: Diagnosis present

## 2019-04-01 DIAGNOSIS — D5700 Hb-SS disease with crisis, unspecified: Secondary | ICD-10-CM | POA: Diagnosis present

## 2019-04-01 DIAGNOSIS — Z9981 Dependence on supplemental oxygen: Secondary | ICD-10-CM | POA: Diagnosis not present

## 2019-04-01 DIAGNOSIS — M79604 Pain in right leg: Secondary | ICD-10-CM | POA: Diagnosis present

## 2019-04-01 DIAGNOSIS — G894 Chronic pain syndrome: Secondary | ICD-10-CM | POA: Diagnosis present

## 2019-04-01 DIAGNOSIS — R1032 Left lower quadrant pain: Secondary | ICD-10-CM | POA: Diagnosis not present

## 2019-04-01 DIAGNOSIS — F329 Major depressive disorder, single episode, unspecified: Secondary | ICD-10-CM | POA: Diagnosis present

## 2019-04-01 DIAGNOSIS — R531 Weakness: Secondary | ICD-10-CM | POA: Diagnosis present

## 2019-04-01 DIAGNOSIS — F1120 Opioid dependence, uncomplicated: Secondary | ICD-10-CM | POA: Diagnosis present

## 2019-04-01 DIAGNOSIS — F4320 Adjustment disorder, unspecified: Secondary | ICD-10-CM | POA: Diagnosis present

## 2019-04-01 DIAGNOSIS — E559 Vitamin D deficiency, unspecified: Secondary | ICD-10-CM | POA: Diagnosis present

## 2019-04-01 DIAGNOSIS — M549 Dorsalgia, unspecified: Secondary | ICD-10-CM | POA: Diagnosis present

## 2019-04-01 DIAGNOSIS — Z832 Family history of diseases of the blood and blood-forming organs and certain disorders involving the immune mechanism: Secondary | ICD-10-CM | POA: Diagnosis not present

## 2019-04-01 DIAGNOSIS — Z79899 Other long term (current) drug therapy: Secondary | ICD-10-CM | POA: Diagnosis not present

## 2019-04-01 DIAGNOSIS — R451 Restlessness and agitation: Secondary | ICD-10-CM | POA: Diagnosis not present

## 2019-04-01 DIAGNOSIS — E876 Hypokalemia: Secondary | ICD-10-CM | POA: Diagnosis present

## 2019-04-01 DIAGNOSIS — Z20822 Contact with and (suspected) exposure to covid-19: Secondary | ICD-10-CM | POA: Diagnosis present

## 2019-04-01 DIAGNOSIS — Z825 Family history of asthma and other chronic lower respiratory diseases: Secondary | ICD-10-CM | POA: Diagnosis not present

## 2019-04-01 DIAGNOSIS — D649 Anemia, unspecified: Secondary | ICD-10-CM | POA: Diagnosis not present

## 2019-04-01 DIAGNOSIS — M79605 Pain in left leg: Secondary | ICD-10-CM | POA: Diagnosis present

## 2019-04-01 DIAGNOSIS — D72829 Elevated white blood cell count, unspecified: Secondary | ICD-10-CM | POA: Diagnosis present

## 2019-04-01 LAB — TYPE AND SCREEN
ABO/RH(D): A POS
Antibody Screen: NEGATIVE
Donor AG Type: NEGATIVE
Unit division: 0

## 2019-04-01 LAB — COMPREHENSIVE METABOLIC PANEL
ALT: 17 U/L (ref 0–44)
AST: 62 U/L — ABNORMAL HIGH (ref 15–41)
Albumin: 3.4 g/dL — ABNORMAL LOW (ref 3.5–5.0)
Alkaline Phosphatase: 138 U/L — ABNORMAL HIGH (ref 38–126)
Anion gap: 11 (ref 5–15)
BUN: 8 mg/dL (ref 6–20)
CO2: 22 mmol/L (ref 22–32)
Calcium: 8.4 mg/dL — ABNORMAL LOW (ref 8.9–10.3)
Chloride: 103 mmol/L (ref 98–111)
Creatinine, Ser: 0.45 mg/dL — ABNORMAL LOW (ref 0.61–1.24)
GFR calc Af Amer: >60 mL/min (ref >60)
GFR calc non Af Amer: >60 mL/min (ref >60)
Glucose, Bld: 100 mg/dL — ABNORMAL HIGH (ref 70–99)
Potassium: 3.6 mmol/L (ref 3.5–5.1)
Sodium: 136 mmol/L (ref 135–145)
Total Bilirubin: 3.3 mg/dL — ABNORMAL HIGH (ref 0.3–1.2)
Total Protein: 6.7 g/dL (ref 6.5–8.1)

## 2019-04-01 LAB — BPAM RBC
Blood Product Expiration Date: 202103102359
ISSUE DATE / TIME: 202102140548
Unit Type and Rh: 9500

## 2019-04-01 LAB — CBC WITH DIFFERENTIAL/PLATELET
Abs Immature Granulocytes: 0.6 10*3/uL — ABNORMAL HIGH (ref 0.00–0.07)
Basophils Absolute: 0.1 10*3/uL (ref 0.0–0.1)
Basophils Relative: 0 %
Eosinophils Absolute: 0 10*3/uL (ref 0.0–0.5)
Eosinophils Relative: 0 %
HCT: 20.5 % — ABNORMAL LOW (ref 39.0–52.0)
Hemoglobin: 6.9 g/dL — CL (ref 13.0–17.0)
Immature Granulocytes: 3 %
Lymphocytes Relative: 5 %
Lymphs Abs: 1 10*3/uL (ref 0.7–4.0)
MCH: 29.9 pg (ref 26.0–34.0)
MCHC: 33.7 g/dL (ref 30.0–36.0)
MCV: 88.7 fL (ref 80.0–100.0)
Monocytes Absolute: 1.4 10*3/uL — ABNORMAL HIGH (ref 0.1–1.0)
Monocytes Relative: 6 %
Neutro Abs: 18.9 10*3/uL — ABNORMAL HIGH (ref 1.7–7.7)
Neutrophils Relative %: 86 %
Platelets: 401 10*3/uL — ABNORMAL HIGH (ref 150–400)
RBC: 2.31 MIL/uL — ABNORMAL LOW (ref 4.22–5.81)
RDW: 26.4 % — ABNORMAL HIGH (ref 11.5–15.5)
WBC: 22 10*3/uL — ABNORMAL HIGH (ref 4.0–10.5)
nRBC: 59.3 % — ABNORMAL HIGH (ref 0.0–0.2)

## 2019-04-01 MED ORDER — HALOPERIDOL 0.5 MG PO TABS
1.0000 mg | ORAL_TABLET | Freq: Four times a day (QID) | ORAL | Status: DC | PRN
  Administered 2019-04-01 – 2019-04-03 (×3): 1 mg via ORAL
  Filled 2019-04-01 (×3): qty 2

## 2019-04-01 NOTE — Progress Notes (Signed)
Pt education of safety recommendation for hospital stay . Advised pt that bed being elevated is unsafe. It puts him as risk for a fall . Pt refused . MD has been notified during previous shifts . Call light within reach . Safety measures in place. continue to monitor pt

## 2019-04-01 NOTE — Plan of Care (Signed)
  Problem: Pain Managment: Goal: General experience of comfort will improve Outcome: Progressing   Problem: Safety: Goal: Ability to remain free from injury will improve Outcome: Progressing   

## 2019-04-01 NOTE — Progress Notes (Signed)
Subjective: Timothy Marks, a 29 year old male with a medical history significant for sickle cell disease, chronic pain syndrome, opiate dependence and tolerance, and history of oppositional defiance disorder was admitted for sickle cell pain crisis. Patient is complaining of generalized pain.  He states, "I hurt all over".  Patient appears to be rolling from side to side in bed.  Pain intensity is 8/10.  Patient denies headache, chest pain, shortness of breath, urinary symptoms, nausea, vomiting, or diarrhea.  Objective:  Vital signs in last 24 hours:  Vitals:   04/01/19 0130 04/01/19 0341 04/01/19 0619 04/01/19 0827  BP: 125/65  121/77   Pulse: (!) 120  (!) 118   Resp: 17 18 (!) 21 18  Temp: 99.1 F (37.3 C)  99.2 F (37.3 C)   TempSrc: Oral  Oral   SpO2: 94% 96% 95% 100%  Weight:      Height:        Intake/Output from previous day:   Intake/Output Summary (Last 24 hours) at 04/01/2019 1035 Last data filed at 04/01/2019 [id number] Gross per 24 hour  Intake 1678.02 ml  Output 2000 ml  Net -321.98 ml   Physical Exam Constitutional:      Appearance: Normal appearance.  Eyes:     Pupils: Pupils are equal, round, and reactive to light.  Cardiovascular:     Rate and Rhythm: Normal rate and regular rhythm.     Pulses: Normal pulses.  Pulmonary:     Effort: Pulmonary effort is normal.  Abdominal:     General: Abdomen is flat. Bowel sounds are normal.  Musculoskeletal:        General: Normal range of motion.  Skin:    General: Skin is warm.  Neurological:     General: No focal deficit present.     Mental Status: He is alert.  Psychiatric:        Mood and Affect: Mood normal. Affect is labile.        Behavior: Behavior is agitated.        Thought Content: Thought content normal. Thought content does not include homicidal or suicidal ideation.     Lab Results:  Basic Metabolic Panel:    Component Value Date/Time   NA 136 04/01/2019 0432   NA 139 08/31/2018 0854   K  3.6 04/01/2019 0432   CL 103 04/01/2019 0432   CO2 22 04/01/2019 0432   BUN 8 04/01/2019 0432   BUN 6 08/31/2018 0854   CREATININE 0.45 (L) 04/01/2019 0432   GLUCOSE 100 (H) 04/01/2019 0432   CALCIUM 8.4 (L) 04/01/2019 0432   CBC:    Component Value Date/Time   WBC 22.0 (H) 04/01/2019 0432   HGB 6.9 (LL) 04/01/2019 0432   HGB 8.0 (L) 11/13/2018 1408   HCT 20.5 (L) 04/01/2019 0432   HCT 25.1 (L) 11/13/2018 1408   PLT 401 (H) 04/01/2019 0432   PLT 792 (H) 11/13/2018 1408   MCV 88.7 04/01/2019 0432   MCV 95 11/13/2018 1408   NEUTROABS 18.9 (H) 04/01/2019 0432   NEUTROABS 5.5 11/13/2018 1408   LYMPHSABS 1.0 04/01/2019 0432   LYMPHSABS 2.9 11/13/2018 1408   MONOABS 1.4 (H) 04/01/2019 0432   EOSABS 0.0 04/01/2019 0432   EOSABS 0.0 11/13/2018 1408   BASOSABS 0.1 04/01/2019 0432   BASOSABS 0.0 11/13/2018 1408    Recent Results (from the past 240 hour(s))  Respiratory Panel by RT PCR (Flu A&B, Covid) - Nasopharyngeal Swab     Status: None   Collection  Time: 03/23/19  9:15 PM   Specimen: Nasopharyngeal Swab  Result Value Ref Range Status   SARS Coronavirus 2 by RT PCR NEGATIVE NEGATIVE Final    Comment: (NOTE) SARS-CoV-2 target nucleic acids are NOT DETECTED. The SARS-CoV-2 RNA is generally detectable in upper respiratoy specimens during the acute phase of infection. The lowest concentration of SARS-CoV-2 viral copies this assay can detect is 131 copies/mL. A negative result does not preclude SARS-Cov-2 infection and should not be used as the sole basis for treatment or other patient management decisions. A negative result may occur with  improper specimen collection/handling, submission of specimen other than nasopharyngeal swab, presence of viral mutation(s) within the areas targeted by this assay, and inadequate number of viral copies (<131 copies/mL). A negative result must be combined with clinical observations, patient history, and epidemiological information.  The expected result is Negative. Fact Sheet for Patients:  PinkCheek.be Fact Sheet for Healthcare Providers:  GravelBags.it This test is not yet ap proved or cleared by the [place] FDA and  has been authorized for detection and/or diagnosis of SARS-CoV-2 by FDA under an Emergency Use Authorization (EUA). This EUA will remain  in effect (meaning this test can be used) for the duration of the COVID-19 declaration under Section 564(b)(1) of the Act, 21 U.S.C. section 360bbb-3(b)(1), unless the authorization is terminated or revoked sooner.    Influenza A by PCR NEGATIVE NEGATIVE Final   Influenza B by PCR NEGATIVE NEGATIVE Final    Comment: (NOTE) The Xpert Xpress SARS-CoV-2/FLU/RSV assay is intended as an aid in  the diagnosis of influenza from Nasopharyngeal swab specimens and  should not be used as a sole basis for treatment. Nasal washings and  aspirates are unacceptable for Xpert Xpress SARS-CoV-2/FLU/RSV  testing. Fact Sheet for Patients: PinkCheek.be Fact Sheet for Healthcare Providers: GravelBags.it This test is not yet approved or cleared by the [place] FDA and  has been authorized for detection and/or diagnosis of SARS-CoV-2 by  FDA under an Emergency Use Authorization (EUA). This EUA will remain  in effect (meaning this test can be used) for the duration of the  Covid-19 declaration under Section 564(b)(1) of the Act, 21  U.S.C. section 360bbb-3(b)(1), unless the authorization is  terminated or revoked. Performed at [facility], [place] [address] [place], [place] [address]   SARS CORONAVIRUS 2 (TAT 6-24 HRS) Nasopharyngeal Nasopharyngeal Swab     Status: None   Collection Time: 03/31/19  1:38 AM   Specimen: Nasopharyngeal Swab  Result Value Ref Range Status   SARS Coronavirus 2 NEGATIVE NEGATIVE Final    Comment:  (NOTE) SARS-CoV-2 target nucleic acids are NOT DETECTED. The SARS-CoV-2 RNA is generally detectable in upper and lower respiratory specimens during the acute phase of infection. Negative results do not preclude SARS-CoV-2 infection, do not rule out co-infections with other pathogens, and should not be used as the sole basis for treatment or other patient management decisions. Negative results must be combined with clinical observations, patient history, and epidemiological information. The expected result is Negative. Fact Sheet for Patients: SugarRoll.be Fact Sheet for Healthcare Providers: https://www.woods-mathews.com/ This test is not yet approved or cleared by the [place] FDA and  has been authorized for detection and/or diagnosis of SARS-CoV-2 by FDA under an Emergency Use Authorization (EUA). This EUA will remain  in effect (meaning this test can be used) for the duration of the COVID-19 declaration under Section 56 4(b)(1) of the Act, 21 U.S.C. section 360bbb-3(b)(1), unless the authorization  is terminated or revoked sooner. Performed at [facility], [place] [address], [place], [place] [address]     Studies/Results: No results found.  Medications: Scheduled Meds: . enoxaparin (LOVENOX) injection  40 mg Subcutaneous Q24H  . HYDROmorphone   Intravenous Q4H  . ketorolac  15 mg Intravenous Q6H   Continuous Infusions: . diphenhydrAMINE     PRN Meds:.diphenhydrAMINE **OR** diphenhydrAMINE, HYDROmorphone (DILAUDID) injection, naloxone **AND** sodium chloride flush, ondansetron **OR** ondansetron (ZOFRAN) IV  Consultants:  None  Procedures:  None  Antibiotics:  None  Assessment/Plan: Principal Problem:   Sickle cell anemia with crisis (HCC) Active Problems:   Hypokalemia   Hyperbilirubinemia   Leukocytosis   Sickle cell crisis (HCC)   Generalized weakness  Sickle cell disease with pain crisis: Discontinue  IV fluids Continue IV Dilaudid PCA with settings of 0.5 mg, 10-minute lockout, and 3 mg/h.  Discontinue IV Dilaudid per clinician assisted doses. Toradol 15 mg IV every 6 hours for a total of 5 days Monitor vital signs closely, supplemental oxygen as needed, reevaluate pain scale regularly  Sickle cell anemia: Patient's hemoglobin is 6.9, which is improved from previous.  Patient is s/p 1 unit PRBCs on 03/31/2019.  Follow CBC in a.m.  Chronic pain medication: Continue home pain medication regimen  History of oppositional defiance disorder: Patient is very agitated.  He is refusing to lower his bed.  Patient counseled at length. Haldol 1 mg every 6 hours as needed for increased agitation.  Code Status: Full Code Family Communication: N/A Disposition Plan: Not yet ready for discharge  [place], MSN, FNP-C Patient [facility] [address] [place], [place] [address] [phone]  If 5PM-7AM, please contact night-coverage.  04/01/2019, 10:35 AM  LOS: 0 days

## 2019-04-02 DIAGNOSIS — E876 Hypokalemia: Secondary | ICD-10-CM

## 2019-04-02 DIAGNOSIS — D649 Anemia, unspecified: Secondary | ICD-10-CM

## 2019-04-02 DIAGNOSIS — R531 Weakness: Secondary | ICD-10-CM

## 2019-04-02 LAB — CBC
HCT: 20.5 % — ABNORMAL LOW (ref 39.0–52.0)
Hemoglobin: 6.9 g/dL — CL (ref 13.0–17.0)
MCH: 30.3 pg (ref 26.0–34.0)
MCHC: 33.7 g/dL (ref 30.0–36.0)
MCV: 89.9 fL (ref 80.0–100.0)
Platelets: 348 10*3/uL (ref 150–400)
RBC: 2.28 MIL/uL — ABNORMAL LOW (ref 4.22–5.81)
RDW: 23.1 % — ABNORMAL HIGH (ref 11.5–15.5)
WBC: 22 10*3/uL — ABNORMAL HIGH (ref 4.0–10.5)
nRBC: 39.9 % — ABNORMAL HIGH (ref 0.0–0.2)

## 2019-04-02 MED ORDER — HYDROMORPHONE 1 MG/ML IV SOLN
INTRAVENOUS | Status: DC
  Administered 2019-04-02: 7.5 mg via INTRAVENOUS
  Administered 2019-04-02: 30 mg via INTRAVENOUS
  Administered 2019-04-02: 5.5 mg via INTRAVENOUS
  Administered 2019-04-03: 3 mg via INTRAVENOUS
  Administered 2019-04-03: 2.5 mg via INTRAVENOUS
  Administered 2019-04-03: 5.5 mg via INTRAVENOUS
  Administered 2019-04-03: 1.5 mg via INTRAVENOUS
  Filled 2019-04-02: qty 30

## 2019-04-02 MED ORDER — OXYCODONE HCL ER 20 MG PO T12A
20.0000 mg | EXTENDED_RELEASE_TABLET | Freq: Two times a day (BID) | ORAL | Status: DC
  Administered 2019-04-02 – 2019-04-03 (×3): 20 mg via ORAL
  Filled 2019-04-02 (×3): qty 1

## 2019-04-02 NOTE — Progress Notes (Signed)
Subjective: Timothy Marks, a 29 year old male with a medical history significant for sickle cell disease, chronic pain syndrome, opiate dependence and a history of oppositional defiant.  Patient is complaining of generalized pain.  Also, he is complaining of left groin pain, which is a new problem.  He denies dysuria, frequency, urgency, or abnormal penile discharge.  Patient history is 7/10.  Patient denies headache, chest pain, shortness of breath, urinary symptoms, nausea, vomiting, or   Objective:  Vital signs in last 24 hours:  Vitals:   04/02/19 0649 04/02/19 0756 04/02/19 0952 04/02/19 1158  BP: (!) 105/53  118/82   Pulse: 99  (!) 101   Resp: 18 19 20 14   Temp: 99.6 F (37.6 C)  99.6 F (37.6 C)   TempSrc: Oral  Oral   SpO2: 99% 100% 100% 99%  Weight:      Height:        Intake/Output from previous day:   Intake/Output Summary (Last 24 hours) at 04/02/2019 1300 Last data filed at 04/01/2019 1800 Gross per 24 hour  Intake 60 ml  Output 400 ml  Net -340 ml    Physical Exam: General: Alert, awake, oriented x3, in no acute distress.  HEENT: [place]/AT PEERL, EOMI Neck: Trachea midline,  no masses, no thyromegal,y no JVD, no carotid bruit OROPHARYNX:  Moist, No exudate/ erythema/lesions.  Heart: Regular rate and rhythm, without murmurs, rubs, gallops, PMI non-displaced, no heaves or thrills on palpation.  Lungs: Clear to auscultation, no wheezing or rhonchi noted. No increased vocal fremitus resonant to percussion  Abdomen: Soft, nontender, nondistended, positive bowel sounds, no masses no hepatosplenomegaly noted..  Neuro: No focal neurological deficits noted cranial nerves II through XII grossly intact. DTRs 2+ bilaterally upper and lower extremities. Strength 5 out of 5 in bilateral upper and lower extremities. Musculoskeletal: No warm swelling or erythema around joints, no spinal tenderness noted. Psychiatric: Patient alert and oriented x3, good insight and cognition,  good recent to remote recall. Lymph node survey: No cervical axillary or inguinal lymphadenopathy noted.  Lab Results:  Basic Metabolic Panel:    Component Value Date/Time   NA 136 04/01/2019 0432   NA 139 08/31/2018 0854   K 3.6 04/01/2019 0432   CL 103 04/01/2019 0432   CO2 22 04/01/2019 0432   BUN 8 04/01/2019 0432   BUN 6 08/31/2018 0854   CREATININE 0.45 (L) 04/01/2019 0432   GLUCOSE 100 (H) 04/01/2019 0432   CALCIUM 8.4 (L) 04/01/2019 0432   CBC:    Component Value Date/Time   WBC 22.0 (H) 04/02/2019 0425   HGB 6.9 (LL) 04/02/2019 0425   HGB 8.0 (L) 11/13/2018 1408   HCT 20.5 (L) 04/02/2019 0425   HCT 25.1 (L) 11/13/2018 1408   PLT 348 04/02/2019 0425   PLT 792 (H) 11/13/2018 1408   MCV 89.9 04/02/2019 0425   MCV 95 11/13/2018 1408   NEUTROABS 18.9 (H) 04/01/2019 0432   NEUTROABS 5.5 11/13/2018 1408   LYMPHSABS 1.0 04/01/2019 0432   LYMPHSABS 2.9 11/13/2018 1408   MONOABS 1.4 (H) 04/01/2019 0432   EOSABS 0.0 04/01/2019 0432   EOSABS 0.0 11/13/2018 1408   BASOSABS 0.1 04/01/2019 0432   BASOSABS 0.0 11/13/2018 1408    Recent Results (from the past 240 hour(s))  Respiratory Panel by RT PCR (Flu A&B, Covid) - Nasopharyngeal Swab     Status: None   Collection Time: 03/23/19  9:15 PM   Specimen: Nasopharyngeal Swab  Result Value Ref Range Status  SARS Coronavirus 2 by RT PCR NEGATIVE NEGATIVE Final    Comment: (NOTE) SARS-CoV-2 target nucleic acids are NOT DETECTED. The SARS-CoV-2 RNA is generally detectable in upper respiratoy specimens during the acute phase of infection. The lowest concentration of SARS-CoV-2 viral copies this assay can detect is 131 copies/mL. A negative result does not preclude SARS-Cov-2 infection and should not be used as the sole basis for treatment or other patient management decisions. A negative result may occur with  improper specimen collection/handling, submission of specimen other than nasopharyngeal swab, presence of viral  mutation(s) within the areas targeted by this assay, and inadequate number of viral copies (<131 copies/mL). A negative result must be combined with clinical observations, patient history, and epidemiological information. The expected result is Negative. Fact Sheet for Patients:  PinkCheek.be Fact Sheet for Healthcare Providers:  GravelBags.it This test is not yet ap proved or cleared by the [place] FDA and  has been authorized for detection and/or diagnosis of SARS-CoV-2 by FDA under an Emergency Use Authorization (EUA). This EUA will remain  in effect (meaning this test can be used) for the duration of the COVID-19 declaration under Section 564(b)(1) of the Act, 21 U.S.C. section 360bbb-3(b)(1), unless the authorization is terminated or revoked sooner.    Influenza A by PCR NEGATIVE NEGATIVE Final   Influenza B by PCR NEGATIVE NEGATIVE Final    Comment: (NOTE) The Xpert Xpress SARS-CoV-2/FLU/RSV assay is intended as an aid in  the diagnosis of influenza from Nasopharyngeal swab specimens and  should not be used as a sole basis for treatment. Nasal washings and  aspirates are unacceptable for Xpert Xpress SARS-CoV-2/FLU/RSV  testing. Fact Sheet for Patients: PinkCheek.be Fact Sheet for Healthcare Providers: GravelBags.it This test is not yet approved or cleared by the [place] FDA and  has been authorized for detection and/or diagnosis of SARS-CoV-2 by  FDA under an Emergency Use Authorization (EUA). This EUA will remain  in effect (meaning this test can be used) for the duration of the  Covid-19 declaration under Section 564(b)(1) of the Act, 21  U.S.C. section 360bbb-3(b)(1), unless the authorization is  terminated or revoked. Performed at [facility], [place] [address], [place], [place] [address]   SARS CORONAVIRUS 2 (TAT 6-24 HRS)  Nasopharyngeal Nasopharyngeal Swab     Status: None   Collection Time: 03/31/19  1:38 AM   Specimen: Nasopharyngeal Swab  Result Value Ref Range Status   SARS Coronavirus 2 NEGATIVE NEGATIVE Final    Comment: (NOTE) SARS-CoV-2 target nucleic acids are NOT DETECTED. The SARS-CoV-2 RNA is generally detectable in upper and lower respiratory specimens during the acute phase of infection. Negative results do not preclude SARS-CoV-2 infection, do not rule out co-infections with other pathogens, and should not be used as the sole basis for treatment or other patient management decisions. Negative results must be combined with clinical observations, patient history, and epidemiological information. The expected result is Negative. Fact Sheet for Patients: SugarRoll.be Fact Sheet for Healthcare Providers: https://www.woods-mathews.com/ This test is not yet approved or cleared by the [place] FDA and  has been authorized for detection and/or diagnosis of SARS-CoV-2 by FDA under an Emergency Use Authorization (EUA). This EUA will remain  in effect (meaning this test can be used) for the duration of the COVID-19 declaration under Section 56 4(b)(1) of the Act, 21 U.S.C. section 360bbb-3(b)(1), unless the authorization is terminated or revoked sooner. Performed at [facility], [place] [address], [place], [place] [address]  Studies/Results: No results found.  Medications: Scheduled Meds: . enoxaparin (LOVENOX) injection  40 mg Subcutaneous Q24H  . HYDROmorphone   Intravenous Q4H  . ketorolac  15 mg Intravenous Q6H  . oxyCODONE  20 mg Oral Q12H   Continuous Infusions: . diphenhydrAMINE     PRN Meds:.diphenhydrAMINE **OR** diphenhydrAMINE, haloperidol, naloxone **AND** sodium chloride flush, ondansetron **OR** ondansetron (ZOFRAN) IV  Consultants:  None  Procedures:  None  Antibiotics:  None  Assessment/Plan: Principal  Problem:   Sickle cell anemia with crisis (HCC) Active Problems:   Hypokalemia   Hyperbilirubinemia   Leukocytosis   Sickle cell crisis (HCC)   Generalized weakness  Sickle cell disease with pain crisis:  Weaning IV Dilaudid PCA Toradol 15 mg IV every 6 hours for total of 5 days Oxycodone 10 mg every 4 hours as needed for severe, breakthrough pain Monitor vital signs closely and supplemental oxygen as needed. Reevaluate pain scale regularly  Sickle cell anemia: Patient's hemoglobin stable.  Patient is s/p 1 unit PRBCs.  Repeat CBC in a.m.  Chronic pain syndrome: Continue home pain medication regimen  History of oppositional defiance disorder: Agitation is improved on today. Continue Haldol 1 mg by mouth every 6 hours as needed for increased agitation    Code Status: Full Code Family Communication: N/A Disposition Plan: Not yet ready for discharge  [place], MSN, FNP-C Patient [facility] [address] [place], [place] [address] [phone]  If 5PM-7AM, please contact night-coverage.  04/02/2019, 1:00 PM  LOS: 1 day

## 2019-04-02 NOTE — Progress Notes (Signed)
   Vital Signs MEWS/VS Documentation      04/02/2019 1158 04/02/2019 1421 04/02/2019 1618 04/02/2019 1810   MEWS Score:  1  1  0  2   MEWS Score Color:  Green  Green  Green  Yellow   Resp:  14  (!) 21  14  17    Pulse:  --  99  --  (!) 120   BP:  --  116/64  --  114/77   Temp:  --  (!) 100.4 F (38 C)  --  99.1 F (37.3 C)   O2 Device:  Room Air  Nasal Cannula  Room Air  Nasal Cannula      Patient triggered yellow mews due to pulse. This is not an acute change for the patient, he has had a high pulse off and on today. Patient in no distress, will continue to monitor.     Cordelia Bessinger A Kevin Mario 04/02/2019,6:28 PM

## 2019-04-03 LAB — CBC
HCT: 18.5 % — ABNORMAL LOW (ref 39.0–52.0)
Hemoglobin: 6.5 g/dL — CL (ref 13.0–17.0)
MCH: 30.4 pg (ref 26.0–34.0)
MCHC: 35.1 g/dL (ref 30.0–36.0)
MCV: 86.4 fL (ref 80.0–100.0)
Platelets: 336 10*3/uL (ref 150–400)
RBC: 2.14 MIL/uL — ABNORMAL LOW (ref 4.22–5.81)
RDW: 20.2 % — ABNORMAL HIGH (ref 11.5–15.5)
WBC: 18.1 10*3/uL — ABNORMAL HIGH (ref 4.0–10.5)
nRBC: 14.1 % — ABNORMAL HIGH (ref 0.0–0.2)

## 2019-04-03 LAB — RPR: RPR Ser Ql: NONREACTIVE

## 2019-04-03 NOTE — Discharge Summary (Signed)
Physician Discharge Summary  Timothy Marks [id number] DOB: 02/18/1990 DOA: 03/31/2019  PCP: Tresa Garter, MD  Admit date: 03/31/2019  Discharge date: 04/03/2019  Discharge Diagnoses:  Principal Problem:   Sickle cell anemia with crisis ([place]) Active Problems:   Hypokalemia   Hyperbilirubinemia   Leukocytosis   Sickle cell crisis (HCC)   Anemia   Generalized weakness   Discharge Condition: Stable  Disposition:  Follow-up Information    Tresa Garter, MD Follow up in 1 week(s).   Specialty: Internal Medicine Contact information: [place] [address] [phone]          Pt is discharged home in good condition and is to follow up with Tresa Garter, MD in 1  week to have labs evaluated. [name] is instructed to increase activity slowly and balance with rest for the next few days, and use prescribed medication to complete treatment of pain  Diet: Regular Wt Readings from Last 3 Encounters:  03/31/19 54.4 kg  03/23/19 54 kg  03/23/19 65.7 kg    History of present illness:  Timothy Marks is a 29 year old male with a medical history significant for sickle cell disease type SS, osteonecrosis, opiate dependence and tolerance, history of anemia, disease, and history of oppositional defiance disorder presented to ER for evaluation of worsening back pain and bilateral upper and lower extremity pain. Patient states that he was treated and evaluated on yesterday morning for the same problem and was discharged home from the emergency department with pain medications. Patient says that his condition did not improve since he was discharged home and continued to worsen. He says the pain was 10/10 on arrival to the hospital but decreased to 6/10 located in lower back, both arms, both legs. Patient also complained of severe generalized weakness. Patient otherwise denies fever, chills, cough, chest pain, shortness of breath, abdominal pain,  nausea, vomiting, or urinary symptoms. He also denies any contact with any sick individuals infected with COVID-19 virus recently.  ER course: On arrival to ER, BP 120/50, HR 100, T 99.2, oxygen saturation 100% on 4 L. Patient does not use oxygen at baseline, but EMS placed patient on oxygen for comfort measures. Blood work showed WBC count 22.6, hemoglobin 6.0, potassium 3.1, bilirubin 2.4. Covid PCR was ordered and results pending.  Hospital Course:  Sickle cell disease with pain crisis: Patient was admitted for sickle cell pain crisis and managed appropriately with IVF, IV Dilaudid via PCA and IV Toradol, as well as other adjunct therapies per sickle cell pain management protocols. IV Dilaudid PCA was weaned appropriately and patient transition to home medications. Pain intensity prior to discharge is 6/10. Patient is requesting discharge home. Patient is requesting prescriptions for pain medications. Patient was prescribed both OxyContin and Percocet on 03/30/2019, patient presented to emergency department on the same day that prescription was filled. Also, he was admitted to inpatient on 03/31/2019. Patient will not receive prescriptions at this time. He was advised to follow-up with pain management. Patient was referred to pain management several weeks prior.   Sickle cell anemia: On admission, hemoglobin was 6.0, which is below patient's baseline. Patient is s/p 2 units PRBCs. Hemoglobin 6.5 prior to discharge, which is below patient's baseline. Patient will follow-up at sickle cell day infusion center for labs on 04/09/2019.   Patient is alert, oriented, and ambulating without assistance. He is afebrile and oxygen saturation is 100% on RA. Patient is aware of all upcoming appointments.  Patient was  discharged home today in a hemodynamically stable condition.   Discharge Exam: Vitals:   04/03/19 0947 04/03/19 1138  BP: (!) 98/55   Pulse: 97   Resp: 16 15  Temp: 98.4 F (36.9 C)    SpO2: 100% 100%   Vitals:   04/03/19 0440 04/03/19 0714 04/03/19 0947 04/03/19 1138  BP:   (!) 98/55   Pulse:   97   Resp: 15 14 16 15   Temp:   98.4 F (36.9 C)   TempSrc:   Oral   SpO2: 100% 99% 100% 100%  Weight:      Height:        General appearance : Awake, alert, not in any distress. Speech Clear. Not toxic looking HEENT: Atraumatic and Normocephalic, pupils equally reactive to light and accomodation Neck: Supple, no JVD. No cervical lymphadenopathy.  Chest: Good air entry bilaterally, no added sounds  CVS: S1 S2 regular, no murmurs.  Abdomen: Bowel sounds present, Non tender and not distended with no gaurding, rigidity or rebound. Extremities: B/L Lower Ext shows no edema, both legs are warm to touch Neurology: Awake alert, and oriented X 3, CN II-XII intact, Non focal Skin: No Rash  Discharge Instructions   Allergies as of 04/03/2019      Reactions   Buprenorphine Hcl Hives   Levaquin [levofloxacin] Itching   Meperidine Rash   Morphine Hives   Morphine And Related Hives   Toradol [ketorolac Tromethamine] Itching   Tramadol Hives   Vancomycin Itching   Zosyn [piperacillin Sod-tazobactam So] Itching, Rash   Has taken rocephin in past      Medication List    TAKE these medications   cholecalciferol 25 MCG (1000 UNIT) tablet Commonly known as: VITAMIN D Take 2 tablets (2,000 Units total) by mouth daily.   Ensure Active High Protein Liqd Take 1 each by mouth 3 (three) times daily between meals. #90 cans of Ensure   folic acid 1 MG tablet Commonly known as: FOLVITE Take 1 tablet (1 mg total) by mouth daily.   gabapentin 300 MG capsule Commonly known as: NEURONTIN Take 1 capsule (300 mg total) by mouth 3 (three) times daily.   hydroxyurea 500 MG capsule Commonly known as: HYDREA Take 1 capsule (500 mg total) by mouth 2 (two) times daily. May take with food to minimize GI side effects.   ibuprofen 800 MG tablet Commonly known as: ADVIL Take 1 tablet  (800 mg total) by mouth 3 (three) times daily as needed (pain).   oxyCODONE-acetaminophen 10-325 MG tablet Commonly known as: Percocet Take 1 tablet by mouth every 6 (six) hours as needed for pain.   OxyCONTIN 20 mg 12 hr tablet Generic drug: oxyCODONE Take 1 tablet (20 mg total) by mouth every 12 (twelve) hours.       The results of significant diagnostics from this hospitalization (including imaging, microbiology, ancillary and laboratory) are listed below for reference.    Significant Diagnostic Studies: No results found.  Microbiology: Recent Results (from the past 240 hour(s))  SARS CORONAVIRUS 2 (TAT 6-24 HRS) Nasopharyngeal Nasopharyngeal Swab     Status: None   Collection Time: 03/31/19  1:38 AM   Specimen: Nasopharyngeal Swab  Result Value Ref Range Status   SARS Coronavirus 2 NEGATIVE NEGATIVE Final    Comment: (NOTE) SARS-CoV-2 target nucleic acids are NOT DETECTED. The SARS-CoV-2 RNA is generally detectable in upper and lower respiratory specimens during the acute phase of infection. Negative results do not preclude SARS-CoV-2 infection, do not rule  out co-infections with other pathogens, and should not be used as the sole basis for treatment or other patient management decisions. Negative results must be combined with clinical observations, patient history, and epidemiological information. The expected result is Negative. Fact Sheet for Patients: SugarRoll.be Fact Sheet for Healthcare Providers: https://www.woods-mathews.com/ This test is not yet approved or cleared by the [place] FDA and  has been authorized for detection and/or diagnosis of SARS-CoV-2 by FDA under an Emergency Use Authorization (EUA). This EUA will remain  in effect (meaning this test can be used) for the duration of the COVID-19 declaration under Section 56 4(b)(1) of the Act, 21 U.S.C. section 360bbb-3(b)(1), unless the authorization is  terminated or revoked sooner. Performed at [facility], [place] [address] [place], [place] [address]      Labs: Basic Metabolic Panel: Recent Labs  Lab 03/30/19 [phone] 03/30/19 0833 03/31/19 0129 04/01/19 0432  NA 137  --  138 136  K 3.8   < > 3.1* 3.6  CL 104  --  103 103  CO2 24  --  26 22  GLUCOSE 102*  --  110* 100*  BUN 9  --  5* 8  CREATININE 0.55*  --  0.57* 0.45*  CALCIUM 8.9  --  8.4* 8.4*   < > = values in this interval not displayed.   Liver Function Tests: Recent Labs  Lab 03/30/19 0833 03/31/19 0129 04/01/19 0432  AST 42* 43* 62*  ALT 22 16 17   ALKPHOS 79 76 138*  BILITOT 3.1* 2.4* 3.3*  PROT 8.3* 7.5 6.7  ALBUMIN 4.3 3.9 3.4*   No results for input(s): LIPASE, AMYLASE in the last 168 hours. No results for input(s): AMMONIA in the last 168 hours. CBC: Recent Labs  Lab 03/30/19 0833 03/31/19 0129 04/01/19 0432 04/02/19 0425 04/03/19 0954  WBC 12.5* 22.6* 22.0* 22.0* 18.1*  NEUTROABS 7.6 15.1* 18.9*  --   --   HGB 6.9* 6.0* 6.9* 6.9* 6.5*  HCT 20.3* 17.0* 20.5* 20.5* 18.5*  MCV 86.8 88.5 88.7 89.9 86.4  PLT 427* 392 401* 348 336   Cardiac Enzymes: No results for input(s): CKTOTAL, CKMB, CKMBINDEX, TROPONINI in the last 168 hours. BNP: Invalid input(s): POCBNP CBG: No results for input(s): GLUCAP in the last 168 hours.  Time coordinating discharge: 50 minutes  Signed:  [name]  APRN, MSN, FNP-C Patient [facility] [address] [place], [place] [address] [phone]  [facility] 04/03/2019, 1:28 PM

## 2019-04-03 NOTE — Progress Notes (Signed)
Reviewed discharge paperwork, follow up appointments, and medication regimen with patient. Patient walked to main entrance.

## 2019-04-03 NOTE — Discharge Instructions (Signed)
Sickle Cell Anemia, Adult  Sickle cell anemia is a condition where your red blood cells are shaped like sickles. Red blood cells carry oxygen through the body. Sickle-shaped cells do not live as long as normal red blood cells. They also clump together and block blood from flowing through the blood vessels. This prevents the body from getting enough oxygen. Sickle cell anemia causes organ damage and pain. It also increases the risk of infection. Follow these instructions at home: Medicines  Take over-the-counter and prescription medicines only as told by your doctor.  If you were prescribed an antibiotic medicine, take it as told by your doctor. Do not stop taking the antibiotic even if you start to feel better.  If you develop a fever, do not take medicines to lower the fever right away. Tell your doctor about the fever. Managing pain, stiffness, and swelling  Try these methods to help with pain: ? Use a heating pad. ? Take a warm bath. ? Distract yourself, such as by watching TV. Eating and drinking  Drink enough fluid to keep your pee (urine) clear or pale yellow. Drink more in hot weather and during exercise.  Limit or avoid alcohol.  Eat a healthy diet. Eat plenty of fruits, vegetables, whole grains, and lean protein.  Take vitamins and supplements as told by your doctor. Traveling  When traveling, keep these with you: ? Your medical information. ? The names of your doctors. ? Your medicines.  If you need to take an airplane, talk to your doctor first. Activity  Rest often.  Avoid exercises that make your heart beat much faster, such as jogging. General instructions  Do not use products that have nicotine or tobacco, such as cigarettes and e-cigarettes. If you need help quitting, ask your doctor.  Consider wearing a medical alert bracelet.  Avoid being in high places (high altitudes), such as mountains.  Avoid very hot or cold temperatures.  Avoid places where the  temperature changes a lot.  Keep all follow-up visits as told by your doctor. This is important. Contact a doctor if:  A joint hurts.  Your feet or hands hurt or swell.  You feel tired (fatigued). Get help right away if:  You have symptoms of infection. These include: ? Fever. ? Chills. ? Being very tired. ? Irritability. ? Poor eating. ? Throwing up (vomiting).  You feel dizzy or faint.  You have new stomach pain, especially on the left side.  You have a an erection (priapism) that lasts more than 4 hours.  You have numbness in your arms or legs.  You have a hard time moving your arms or legs.  You have trouble talking.  You have pain that does not go away when you take medicine.  You are short of breath.  You are breathing fast.  You have a long-term cough.  You have pain in your chest.  You have a bad headache.  You have a stiff neck.  Your stomach looks bloated even though you did not eat much.  Your skin is pale.  You suddenly cannot see well. Summary  Sickle cell anemia is a condition where your red blood cells are shaped like sickles.  Follow your doctor's advice on ways to manage pain, food to eat, activities to do, and steps to take for safe travel.  Get medical help right away if you have any signs of infection, such as a fever. This information is not intended to replace advice given to you by   your health care provider. Make sure you discuss any questions you have with your health care provider. Document Revised: 05/25/2018 Document Reviewed: 03/08/2016 Elsevier Patient Education  2020 Elsevier Inc.  

## 2019-04-04 ENCOUNTER — Other Ambulatory Visit: Payer: Self-pay

## 2019-04-04 DIAGNOSIS — G894 Chronic pain syndrome: Secondary | ICD-10-CM

## 2019-04-04 NOTE — Telephone Encounter (Signed)
Patient requesting refills; Oxycodone - Acetaminophen 10/325 & Oxycontin 20 MG

## 2019-04-09 ENCOUNTER — Ambulatory Visit: Payer: Self-pay | Admitting: Family Medicine

## 2019-04-16 ENCOUNTER — Ambulatory Visit: Payer: Self-pay | Admitting: Family Medicine

## 2019-04-30 ENCOUNTER — Non-Acute Institutional Stay (HOSPITAL_COMMUNITY)
Admission: AD | Admit: 2019-04-30 | Discharge: 2019-04-30 | Disposition: A | Discharge status: Home / Self Care | Payer: Medicaid Other | Source: Ambulatory Visit | Attending: Internal Medicine | Admitting: Internal Medicine

## 2019-04-30 ENCOUNTER — Encounter (HOSPITAL_COMMUNITY): Payer: Self-pay | Admitting: Family Medicine

## 2019-04-30 ENCOUNTER — Telehealth (HOSPITAL_COMMUNITY): Payer: Self-pay | Admitting: *Deleted

## 2019-04-30 DIAGNOSIS — D5700 Hb-SS disease with crisis, unspecified: Secondary | ICD-10-CM | POA: Diagnosis present

## 2019-04-30 DIAGNOSIS — M545 Low back pain: Secondary | ICD-10-CM | POA: Diagnosis present

## 2019-04-30 LAB — CBC WITH DIFFERENTIAL/PLATELET
Abs Immature Granulocytes: 0.03 10*3/uL (ref 0.00–0.07)
Basophils Absolute: 0 10*3/uL (ref 0.0–0.1)
Basophils Relative: 0 %
Eosinophils Absolute: 0.2 10*3/uL (ref 0.0–0.5)
Eosinophils Relative: 2 %
HCT: 39.5 % (ref 39.0–52.0)
Hemoglobin: 12.7 g/dL — ABNORMAL LOW (ref 13.0–17.0)
Immature Granulocytes: 0 %
Lymphocytes Relative: 26 %
Lymphs Abs: 2.1 10*3/uL (ref 0.7–4.0)
MCH: 26 pg (ref 26.0–34.0)
MCHC: 32.2 g/dL (ref 30.0–36.0)
MCV: 80.9 fL (ref 80.0–100.0)
Monocytes Absolute: 0.7 10*3/uL (ref 0.1–1.0)
Monocytes Relative: 8 %
Neutro Abs: 5.1 10*3/uL (ref 1.7–7.7)
Neutrophils Relative %: 64 %
Platelets: 526 10*3/uL — ABNORMAL HIGH (ref 150–400)
RBC: 4.88 MIL/uL (ref 4.22–5.81)
RDW: 20.9 % — ABNORMAL HIGH (ref 11.5–15.5)
WBC: 8.1 10*3/uL (ref 4.0–10.5)
nRBC: 3.1 % — ABNORMAL HIGH (ref 0.0–0.2)

## 2019-04-30 LAB — COMPREHENSIVE METABOLIC PANEL
ALT: 16 U/L (ref 0–44)
AST: 47 U/L — ABNORMAL HIGH (ref 15–41)
Albumin: 4.2 g/dL (ref 3.5–5.0)
Alkaline Phosphatase: 127 U/L — ABNORMAL HIGH (ref 38–126)
Anion gap: 7 (ref 5–15)
BUN: 6 mg/dL (ref 6–20)
CO2: 24 mmol/L (ref 22–32)
Calcium: 8.9 mg/dL (ref 8.9–10.3)
Chloride: 102 mmol/L (ref 98–111)
Creatinine, Ser: 0.62 mg/dL (ref 0.61–1.24)
GFR calc Af Amer: >60 mL/min (ref >60)
GFR calc non Af Amer: >60 mL/min (ref >60)
Glucose, Bld: 96 mg/dL (ref 70–99)
Potassium: 5.4 mmol/L — ABNORMAL HIGH (ref 3.5–5.1)
Sodium: 133 mmol/L — ABNORMAL LOW (ref 135–145)
Total Bilirubin: 0.9 mg/dL (ref 0.3–1.2)
Total Protein: 8.3 g/dL — ABNORMAL HIGH (ref 6.5–8.1)

## 2019-04-30 LAB — RETICULOCYTES
Immature Retic Fract: 43.8 % — ABNORMAL HIGH (ref 2.3–15.9)
RBC.: 4.84 MIL/uL (ref 4.22–5.81)
Retic Count, Absolute: 257.5 10*3/uL — ABNORMAL HIGH (ref 19.0–186.0)
Retic Ct Pct: 5.3 % — ABNORMAL HIGH (ref 0.4–3.1)

## 2019-04-30 MED ORDER — HYDROMORPHONE 1 MG/ML IV SOLN
INTRAVENOUS | Status: DC
  Administered 2019-04-30: 30 mg via INTRAVENOUS
  Administered 2019-04-30: 6.5 mg via INTRAVENOUS
  Filled 2019-04-30: qty 30

## 2019-04-30 MED ORDER — ONDANSETRON HCL 4 MG/2ML IJ SOLN
4.0000 mg | Freq: Four times a day (QID) | INTRAMUSCULAR | Status: DC | PRN
  Administered 2019-04-30: 4 mg via INTRAVENOUS
  Filled 2019-04-30: qty 2

## 2019-04-30 MED ORDER — DEXTROSE-NACL 5-0.45 % IV SOLN: INTRAVENOUS | Status: DC

## 2019-04-30 MED ORDER — NALOXONE HCL 0.4 MG/ML IJ SOLN: 0.4000 mg | INTRAMUSCULAR | Status: DC | PRN

## 2019-04-30 MED ORDER — DIPHENHYDRAMINE HCL 25 MG PO CAPS: 25.0000 mg | ORAL_CAPSULE | ORAL | Status: DC | PRN

## 2019-04-30 MED ORDER — SODIUM CHLORIDE 0.9% FLUSH: 9.0000 mL | INTRAVENOUS | Status: DC | PRN

## 2019-04-30 MED ORDER — ACETAMINOPHEN 500 MG PO TABS
1000.0000 mg | ORAL_TABLET | Freq: Once | ORAL | Status: AC
  Administered 2019-04-30: 1000 mg via ORAL
  Filled 2019-04-30: qty 2

## 2019-04-30 NOTE — Telephone Encounter (Signed)
Patient called requesting to come to the day hospital for sickle cell pain. Patient reports lower back pain rated 8/10. Reports taking Percocet last night and only having one tablet left. Per patient, he will not be able to fill prescription until next Friday.  COVID-19 screening done and patient denies all symptoms and exposures. Denies fever, chest pain, nausea, vomiting, diarrhea, abdominal pain and priapism. Admits to having transportation without driving self. [place], [place] notified. Patient can come to the day hospital for treatment. Patient advised and expresses an understanding.

## 2019-04-30 NOTE — Discharge Instructions (Signed)
Sickle Cell Anemia, Adult  Sickle cell anemia is a condition where your red blood cells are shaped like sickles. Red blood cells carry oxygen through the body. Sickle-shaped cells do not live as long as normal red blood cells. They also clump together and block blood from flowing through the blood vessels. This prevents the body from getting enough oxygen. Sickle cell anemia causes organ damage and pain. It also increases the risk of infection. Follow these instructions at home: Medicines  Take over-the-counter and prescription medicines only as told by your doctor.  If you were prescribed an antibiotic medicine, take it as told by your doctor. Do not stop taking the antibiotic even if you start to feel better.  If you develop a fever, do not take medicines to lower the fever right away. Tell your doctor about the fever. Managing pain, stiffness, and swelling  Try these methods to help with pain: ? Use a heating pad. ? Take a warm bath. ? Distract yourself, such as by watching TV. Eating and drinking  Drink enough fluid to keep your pee (urine) clear or pale yellow. Drink more in hot weather and during exercise.  Limit or avoid alcohol.  Eat a healthy diet. Eat plenty of fruits, vegetables, whole grains, and lean protein.  Take vitamins and supplements as told by your doctor. Traveling  When traveling, keep these with you: ? Your medical information. ? The names of your doctors. ? Your medicines.  If you need to take an airplane, talk to your doctor first. Activity  Rest often.  Avoid exercises that make your heart beat much faster, such as jogging. General instructions  Do not use products that have nicotine or tobacco, such as cigarettes and e-cigarettes. If you need help quitting, ask your doctor.  Consider wearing a medical alert bracelet.  Avoid being in high places (high altitudes), such as mountains.  Avoid very hot or cold temperatures.  Avoid places where the  temperature changes a lot.  Keep all follow-up visits as told by your doctor. This is important. Contact a doctor if:  A joint hurts.  Your feet or hands hurt or swell.  You feel tired (fatigued). Get help right away if:  You have symptoms of infection. These include: ? Fever. ? Chills. ? Being very tired. ? Irritability. ? Poor eating. ? Throwing up (vomiting).  You feel dizzy or faint.  You have new stomach pain, especially on the left side.  You have a an erection (priapism) that lasts more than 4 hours.  You have numbness in your arms or legs.  You have a hard time moving your arms or legs.  You have trouble talking.  You have pain that does not go away when you take medicine.  You are short of breath.  You are breathing fast.  You have a long-term cough.  You have pain in your chest.  You have a bad headache.  You have a stiff neck.  Your stomach looks bloated even though you did not eat much.  Your skin is pale.  You suddenly cannot see well. Summary  Sickle cell anemia is a condition where your red blood cells are shaped like sickles.  Follow your doctor's advice on ways to manage pain, food to eat, activities to do, and steps to take for safe travel.  Get medical help right away if you have any signs of infection, such as a fever. This information is not intended to replace advice given to you by   your health care provider. Make sure you discuss any questions you have with your health care provider. Document Revised: 05/25/2018 Document Reviewed: 03/08/2016 Elsevier Patient Education  2020 Elsevier Inc.  

## 2019-04-30 NOTE — Progress Notes (Signed)
Patient admitted to the day hospital for treatment of sickle cell pain crisis. Patient reported pain rated 8/10 in the Back . Patient placed on Dilaudid PCA, given IV Zofran, PO Tylenol and hydrated with IV fluids. At discharge patient reported  pain at 3/10. Discharge instructions given to patient. Patient alert, oriented and ambulatory at discharge.

## 2019-04-30 NOTE — H&P (Signed)
[facility] History and Physical   Date: 04/30/2019  Patient name: Timothy Marks Medical record number: [id number] Date of birth: 1990-11-11 Age: 29 y.o. Gender: male PCP: No primary care provider on file.  Attending physician: Tresa Garter, MD  Chief Complaint: Sickle cell pain  History of Present Illness: Timothy Marks, a 29 year old male with a medical history significant for sickle cell disease, chronic pain syndrome, opiate dependence and tolerance, and history of anemia of chronic disease presents complaining of low back pain that is consistent with his past sickle cell pain crisis. Patient says that pain has been increased over the past several days and has been unrelieved by home medications.  He last had Percocet this a.m., which was his last pill.  Pain intensity is 9/10 characterized as constant and throbbing.  Patient denies fever, chills, headache, chest pain, shortness of breath, nausea, vomiting, or diarrhea.  He has no sick contacts, recent travel, or exposure to COVID-19.  Meds: Medications Prior to Admission  Medication Sig Dispense Refill Last Dose  . cholecalciferol (VITAMIN D) 1000 units tablet Take 2 tablets (2,000 Units total) by mouth daily. 30 tablet 6 04/30/2019 at Unknown time  . folic acid (FOLVITE) 1 MG tablet Take 1 tablet (1 mg total) by mouth daily. 90 tablet 2 04/30/2019 at Unknown time  . gabapentin (NEURONTIN) 300 MG capsule Take 1 capsule (300 mg total) by mouth 3 (three) times daily. 90 capsule 5 04/30/2019 at Unknown time  . hydroxyurea (HYDREA) 500 MG capsule Take 1 capsule (500 mg total) by mouth 2 (two) times daily. May take with food to minimize GI side effects. 180 capsule 2 04/30/2019 at Unknown time  . ibuprofen (ADVIL) 800 MG tablet Take 1 tablet (800 mg total) by mouth 3 (three) times daily as needed (pain). 30 tablet 0 04/30/2019 at Unknown time  . Nutritional Supplements (ENSURE ACTIVE HIGH PROTEIN) LIQD Take 1 each by mouth 3  (three) times daily between meals. #90 cans of Ensure 237 mL 11 04/30/2019 at Unknown time  . oxyCODONE-acetaminophen (PERCOCET) 10-325 MG tablet Take 1 tablet by mouth every 6 (six) hours as needed for pain. 20 tablet 0 04/30/2019 at Unknown time  . OXYCONTIN 20 MG 12 hr tablet Take 1 tablet (20 mg total) by mouth every 12 (twelve) hours. 10 tablet 0 04/30/2019 at Unknown time    Allergies: Buprenorphine hcl, Levaquin [levofloxacin], Meperidine, Morphine, Morphine and related, Toradol [ketorolac tromethamine], Tramadol, Vancomycin, and Zosyn [piperacillin sod-tazobactam so] Past Medical History:  Diagnosis Date  . Depression   . Generalized weakness 03/31/2019  . 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 Sickle cell anemia (HCC)   . Vitamin D deficiency y-6   Past Surgical History:  Procedure Laterality Date  . CHOLECYSTECTOMY     Family History  Problem Relation Age of Onset  . Sickle cell trait Mother   . Sickle cell trait Father    Social History   Socioeconomic History  . Marital status: Single    Spouse name: Not on file  . Number of children: Not on file  . Years of education: Not on file  . Highest education level: Not on file  Occupational History  . Not on file  Tobacco Use  . Smoking status: Never Smoker  . Smokeless tobacco: Never Used  Substance and Sexual Activity  . Alcohol use: No  . Drug use: Not Currently    Types: Marijuana  .  Sexual activity: Not on file  Other Topics Concern  . Not on file  Social History Narrative  . Not on file   Social Determinants of Health   Financial Resource Strain:   . Difficulty of Paying Living Expenses:   Food Insecurity:   . Worried About Charity fundraiser in the Last Year:   . Arboriculturist in the Last Year:   Transportation Needs:   . Film/video editor (Medical):   Marland Kitchen Lack of Transportation (Non-Medical):   Physical Activity:   . Days of Exercise  per Week:   . Minutes of Exercise per Session:   Stress:   . Feeling of Stress :   Social Connections:   . Frequency of Communication with Friends and Family:   . Frequency of Social Gatherings with Friends and Family:   . Attends Religious Services:   . Active Member of Clubs or Organizations:   . Attends Archivist Meetings:   Marland Kitchen Marital Status:   Intimate Partner Violence:   . Fear of Current or Ex-Partner:   . Emotionally Abused:   Marland Kitchen Physically Abused:   . Sexually Abused:    Review of Systems  Constitutional: Negative for chills and fever.  HENT: Negative.   Respiratory: Negative.   Cardiovascular: Negative for chest pain and palpitations.  Gastrointestinal: Negative.   Genitourinary: Negative.   Musculoskeletal: Positive for back pain and joint pain.  Skin: Negative.   Neurological: Negative.  Negative for dizziness and headaches.  Psychiatric/Behavioral: Negative.    Physical Exam: Blood pressure (!) 119/55, pulse 78, temperature 98.1 F (36.7 C), temperature source Temporal, resp. rate 16, SpO2 100 %. Physical Exam HENT:     Head: Normocephalic.     Nose: Nose normal.     Mouth/Throat:     Mouth: Mucous membranes are moist.  Cardiovascular:     Rate and Rhythm: Normal rate and regular rhythm.     Pulses: Normal pulses.  Pulmonary:     Effort: Pulmonary effort is normal.     Breath sounds: Normal breath sounds.  Abdominal:     General: Abdomen is flat. Bowel sounds are normal.  Musculoskeletal:        General: Normal range of motion.  Skin:    General: Skin is warm.  Neurological:     General: No focal deficit present.     Mental Status: He is alert. Mental status is at baseline.  Psychiatric:        Mood and Affect: Mood normal.        Behavior: Behavior normal.        Thought Content: Thought content normal.        Judgment: Judgment normal.      Lab results: Results for orders placed or performed during the hospital encounter of  04/30/19 (from the past 24 hour(s))  CBC WITH DIFFERENTIAL     Status: Abnormal   Collection Time: 04/30/19  9:28 AM  Result Value Ref Range   WBC 8.1 4.0 - 10.5 K/uL   RBC 4.88 4.22 - 5.81 MIL/uL   Hemoglobin 12.7 (L) 13.0 - 17.0 g/dL   HCT 39.5 39.0 - 52.0 %   MCV 80.9 80.0 - 100.0 fL   MCH 26.0 26.0 - 34.0 pg   MCHC 32.2 30.0 - 36.0 g/dL   RDW 20.9 (H) 11.5 - 15.5 %   Platelets 526 (H) 150 - 400 K/uL   nRBC 3.1 (H) 0.0 - 0.2 %  Neutrophils Relative % 64 %   Neutro Abs 5.1 1.7 - 7.7 K/uL   Lymphocytes Relative 26 %   Lymphs Abs 2.1 0.7 - 4.0 K/uL   Monocytes Relative 8 %   Monocytes Absolute 0.7 0.1 - 1.0 K/uL   Eosinophils Relative 2 %   Eosinophils Absolute 0.2 0.0 - 0.5 K/uL   Basophils Relative 0 %   Basophils Absolute 0.0 0.0 - 0.1 K/uL   Immature Granulocytes 0 %   Abs Immature Granulocytes 0.03 0.00 - 0.07 K/uL  Reticulocytes     Status: Abnormal   Collection Time: 04/30/19  9:28 AM  Result Value Ref Range   Retic Ct Pct 5.3 (H) 0.4 - 3.1 %   RBC. 4.84 4.22 - 5.81 MIL/uL   Retic Count, Absolute 257.5 (H) 19.0 - 186.0 K/uL   Immature Retic Fract 43.8 (H) 2.3 - 15.9 %  Comprehensive metabolic panel     Status: Abnormal   Collection Time: 04/30/19  9:28 AM  Result Value Ref Range   Sodium 133 (L) 135 - 145 mmol/L   Potassium 5.4 (H) 3.5 - 5.1 mmol/L   Chloride 102 98 - 111 mmol/L   CO2 24 22 - 32 mmol/L   Glucose, Bld 96 70 - 99 mg/dL   BUN 6 6 - 20 mg/dL   Creatinine, Ser 0.62 0.61 - 1.24 mg/dL   Calcium 8.9 8.9 - 10.3 mg/dL   Total Protein 8.3 (H) 6.5 - 8.1 g/dL   Albumin 4.2 3.5 - 5.0 g/dL   AST 47 (H) 15 - 41 U/L   ALT 16 0 - 44 U/L   Alkaline Phosphatase 127 (H) 38 - 126 U/L   Total Bilirubin 0.9 0.3 - 1.2 mg/dL   GFR calc non Af Amer >60 >60 mL/min   GFR calc Af Amer >60 >60 mL/min   Anion gap 7 5 - 15    Imaging results:  No results found.   Assessment & Plan: Patient admitted to sickle cell day infusion center for management of pain crisis.   Patient is opiate tolerant Initiate IV dilaudid PCA. Settings of  IV fluids, 0.45% saline at 100 ml/hr Toradol 15 mg IV times one dose Tylenol 1000 mg by mouth times one dose Review CBC with differential, complete metabolic panel, and reticulocytes as results become available. Pain intensity will be reevaluated in context of functioning and relationship to baseline as care progress If pain intensity remains elevated and/or sudden change in hemodynamic stability transition to inpatient services for higher level of care.    [name]  APRN, MSN, FNP-C Patient [facility] [address] [place], [place] [address] [phone]  04/30/2019, 4:08 PM

## 2019-04-30 NOTE — Discharge Summary (Signed)
[facility] Discharge Summary   Patient ID: Timothy Marks MRN: [id number] DOB/AGE: [birth year] 31 y.o.  Admit date: 04/30/2019 Discharge date: 04/30/2019  Primary Care Physician:  No primary care provider on file.  Admission Diagnoses:  Active Problems:   Sickle cell pain crisis [facility])   Discharge Medications:  Allergies as of 04/30/2019      Reactions   Buprenorphine Hcl Hives   Levaquin [levofloxacin] Itching   Meperidine Rash   Morphine Hives   Morphine And Related Hives   Toradol [ketorolac Tromethamine] Itching   Tramadol Hives   Vancomycin Itching   Zosyn [piperacillin Sod-tazobactam So] Itching, Rash   Has taken rocephin in past      Medication List    TAKE these medications   cholecalciferol 25 MCG (1000 UNIT) tablet Commonly known as: VITAMIN D Take 2 tablets (2,000 Units total) by mouth daily.   Ensure Active High Protein Liqd Take 1 each by mouth 3 (three) times daily between meals. #90 cans of Ensure   folic acid 1 MG tablet Commonly known as: FOLVITE Take 1 tablet (1 mg total) by mouth daily.   gabapentin 300 MG capsule Commonly known as: NEURONTIN Take 1 capsule (300 mg total) by mouth 3 (three) times daily.   hydroxyurea 500 MG capsule Commonly known as: HYDREA Take 1 capsule (500 mg total) by mouth 2 (two) times daily. May take with food to minimize GI side effects.   ibuprofen 800 MG tablet Commonly known as: ADVIL Take 1 tablet (800 mg total) by mouth 3 (three) times daily as needed (pain).   oxyCODONE-acetaminophen 10-325 MG tablet Commonly known as: Percocet Take 1 tablet by mouth every 6 (six) hours as needed for pain.   OxyCONTIN 20 mg 12 hr tablet Generic drug: oxyCODONE Take 1 tablet (20 mg total) by mouth every 12 (twelve) hours.        Consults:  None  Significant Diagnostic Studies:  No results found.  History of present illness:  Timothy Marks, a 29 year old male with a medical history significant for  sickle cell disease, chronic pain syndrome, opiate dependence and tolerance, and history of anemia of chronic disease presents complaining of low back pain that is consistent with his past sickle cell pain crisis. Patient says that pain has been increased over the past several days and has been unrelieved by home medications.  He last had Percocet this a.m., which was his last pill.  Pain intensity is 9/10 characterized as constant and throbbing.  Patient denies fever, chills, headache, chest pain, shortness of breath, nausea, vomiting, or diarrhea.  He has no sick contacts, recent travel, or exposure to COVID-19.   [facility] Course: Patient admitted to sickle cell day infusion center for management of pain crisis.  Reviewed all laboratory values, consistent with patient's baseline.  Pain managed with IV dilaudid PCA, with settings of 0.5 mg, 10 minute lockout, and 3 mg per hour.  IV fluids, 0.45% saline at 100 ml/hr Tylenol 1000 mg by mouth times 1 dose Pain intensity decreased to 3/10. Patient does not warrant admission at this time.  Patient alert, oriented, and ambulating without assistance.    Discharge instruction:  Resume all home medications.   Follow up with PCP as previously  scheduled.   Discussed the importance of drinking 64 ounces of water daily, dehydration of red blood cells may lead further sickling.   Avoid all stressors that precipitate sickle cell pain crisis.     The  patient was given clear instructions to go to ER or return to medical center if symptoms do not improve, worsen or new problems develop.    Physical Exam at Discharge:  BP (!) 119/55 (BP Location: Left Arm)   Pulse 78   Temp 98.1 F (36.7 C) (Temporal)   Resp 16   SpO2 100%  Physical Exam Constitutional:      Appearance: Normal appearance.  HENT:     Head: Normocephalic.     Mouth/Throat:     Mouth: Mucous membranes are moist.  Eyes:     Pupils: Pupils are equal, round, and  reactive to light.  Cardiovascular:     Rate and Rhythm: Normal rate and regular rhythm.     Pulses: Normal pulses.  Pulmonary:     Effort: Pulmonary effort is normal.  Abdominal:     General: Bowel sounds are normal.     Palpations: Abdomen is soft.  Neurological:     General: No focal deficit present.     Mental Status: He is alert. Mental status is at baseline.  Psychiatric:        Mood and Affect: Mood normal.        Behavior: Behavior normal.        Thought Content: Thought content normal.        Judgment: Judgment normal.     Disposition at Discharge: Discharge disposition: 01-Home or Self Care       Discharge Orders:   Condition at Discharge:   Stable  Time spent on Discharge:  Greater than 30 minutes.  Signed: [name]  APRN, MSN, FNP-C Patient [facility] [address] [place], [place] [address] [phone]  04/30/2019, 4:17 PM

## 2019-05-01 ENCOUNTER — Emergency Department (HOSPITAL_COMMUNITY)
Admission: EM | Admit: 2019-05-01 | Discharge: 2019-05-01 | Disposition: A | Discharge status: Home / Self Care | Payer: Medicaid Other | Attending: Emergency Medicine | Admitting: Emergency Medicine

## 2019-05-01 ENCOUNTER — Encounter (HOSPITAL_COMMUNITY): Payer: Self-pay | Admitting: Emergency Medicine

## 2019-05-01 ENCOUNTER — Other Ambulatory Visit: Payer: Self-pay

## 2019-05-01 DIAGNOSIS — Z79899 Other long term (current) drug therapy: Secondary | ICD-10-CM | POA: Diagnosis not present

## 2019-05-01 DIAGNOSIS — D5700 Hb-SS disease with crisis, unspecified: Secondary | ICD-10-CM | POA: Diagnosis not present

## 2019-05-01 DIAGNOSIS — M545 Low back pain: Secondary | ICD-10-CM | POA: Diagnosis present

## 2019-05-01 MED ORDER — IBUPROFEN 400 MG PO TABS
600.0000 mg | ORAL_TABLET | Freq: Once | ORAL | Status: AC
  Administered 2019-05-01: 600 mg via ORAL
  Filled 2019-05-01: qty 1

## 2019-05-01 MED ORDER — OXYCODONE-ACETAMINOPHEN 5-325 MG PO TABS
2.0000 | ORAL_TABLET | Freq: Once | ORAL | Status: AC
  Administered 2019-05-01: 2 via ORAL
  Filled 2019-05-01: qty 2

## 2019-05-01 MED ORDER — OXYCODONE HCL ER 10 MG PO T12A: 10.0000 mg | EXTENDED_RELEASE_TABLET | Freq: Two times a day (BID) | ORAL | Status: DC

## 2019-05-01 NOTE — ED Notes (Signed)
Patient Alert and oriented to baseline. Stable and ambulatory to baseline. Patient verbalized understanding of the discharge instructions.  Patient belongings were taken by the patient.   

## 2019-05-01 NOTE — ED Triage Notes (Signed)
C/o sickle cell crisis with sharp back pain and bilateral leg pain since last night.  Pt refusing blood work at triage and states he wants to wait until he gets to the back.

## 2019-05-01 NOTE — ED Provider Notes (Signed)
[facility] EMERGENCY DEPARTMENT Provider Note   CSN: [id number] Arrival date & time: 05/01/19  0747     History Chief Complaint  Patient presents with  . Sickle Cell Pain Crisis    Timothy Marks is a 29 y.o. male with history of depression, sickle cell anemia, osteonecrosis of the left shoulder, chronic pain syndrome presents for evaluation of acute onset, persistent low back and bilateral leg pains since last night.  Reports pain is constant, sharp, worsens with ambulation.  Denies numbness or weakness of the extremities.  Denies bowel or bladder incontinence, saddle anesthesia, fevers, or history of IV drug use.  Denies abdominal pain, nausea, vomiting, chest pain or shortness of breath.  He tells me that he took his last tablet of Percocet yesterday evening but of note he was seen and evaluated in the [facility] for the same complaint and also told the provider caring for him that he had his last Percocet yesterday morning.  Per documentation of this visit his pain was reduced to 3/10 in severity, his lab work was reassuring, and he was found to be stable for discharge at the time.  He tells me that he has an appointment scheduled to see his pain management provider next Friday.  Per [facility] controlled substance registry patient filled a 30-day supply of Percocet 10/325 mg tablet on 04/12/2019 which means that he ran out of his medication approximately 13 days early.  He states the pain is consistent with his usual sickle cell pains.  The history is provided by the patient and medical records.       Past Medical History:  Diagnosis Date  . Depression   . Generalized weakness 03/31/2019  . 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 Sickle cell anemia (HCC)   . Vitamin D deficiency y-6    Patient Active Problem List   Diagnosis Date Noted  . Generalized weakness 03/31/2019  .  Sickle-cell crisis ([place]) 03/24/2019  . SIRS (systemic inflammatory response syndrome) ([place]) 02/10/2019  . Abnormal liver function 11/04/2018  . Sickle cell anemia with crisis ([place]) 05/22/2018  . Acute bilateral low back pain without sciatica   . Other chronic pain   . Anemia   . 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 Physical deconditioning   . Agitation   . Chronic pain syndrome   . Recurrent cold sores   . Fever   . Pain   . Sickle cell crisis ([place]) 07/16/2017  . Adjustment disorder with mixed disturbance of emotions and conduct   . Constipation 08/03/2014  . Leukocytosis 06/30/2014  . 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 Protein-calorie malnutrition, severe ([place]) 04/09/2014  . Major depressive disorder, recurrent, severe without psychotic features ([place])   . 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, left shoulder ([place])   . Vitamin D deficiency   . Sickle cell anemia ([place]) 03/30/2014  . Hyperbilirubinemia 03/30/2014  . Hypokalemia 02/07/2014  . Aggressive behavior 01/25/2013  . Sickle cell anemia with pain ([place]) 01/24/2013  . Sickle cell pain crisis ([place]) 01/24/2013    Past Surgical History:  Procedure Laterality Date  . CHOLECYSTECTOMY         Family History  Problem Relation Age of Onset  . Sickle cell trait Mother   . Sickle cell trait Father     Social History   Tobacco Use  . Smoking status: Never Smoker  . Smokeless tobacco: Never Used  Substance Use Topics  .  Alcohol use: No  . Drug use: Not Currently    Types: Marijuana    Home Medications Prior to Admission medications   Medication Sig Start Date End Date Taking? Authorizing Provider  cholecalciferol (VITAMIN D) 1000 units tablet Take 2 tablets (2,000 Units total) by mouth daily. 12/13/17   Azzie Glatter, FNP  folic acid (FOLVITE) 1 MG tablet Take 1 tablet (1 mg total) by mouth daily. 05/01/17   Dorena Dew, FNP  gabapentin (NEURONTIN) 300 MG capsule Take 1 capsule (300 mg total) by mouth 3 (three) times  daily. 03/12/19   Dorena Dew, FNP  hydroxyurea (HYDREA) 500 MG capsule Take 1 capsule (500 mg total) by mouth 2 (two) times daily. May take with food to minimize GI side effects. 05/01/17   Dorena Dew, FNP  ibuprofen (ADVIL) 800 MG tablet Take 1 tablet (800 mg total) by mouth 3 (three) times daily as needed (pain). 03/12/19   Dorena Dew, FNP  Nutritional Supplements (ENSURE ACTIVE HIGH PROTEIN) LIQD Take 1 each by mouth 3 (three) times daily between meals. #90 cans of Ensure 11/13/18   Dorena Dew, FNP  oxyCODONE-acetaminophen (PERCOCET) 10-325 MG tablet Take 1 tablet by mouth every 6 (six) hours as needed for pain. 03/27/19 03/26/20  Dorena Dew, FNP  OXYCONTIN 20 MG 12 hr tablet Take 1 tablet (20 mg total) by mouth every 12 (twelve) hours. 03/27/19   Dorena Dew, FNP    Allergies    Buprenorphine hcl, Levaquin [levofloxacin], Meperidine, Morphine, Morphine and related, Toradol [ketorolac tromethamine], Tramadol, Vancomycin, and Zosyn [piperacillin sod-tazobactam so]  Review of Systems   Review of Systems  Constitutional: Negative for chills and fever.  Respiratory: Negative for shortness of breath.   Cardiovascular: Negative for chest pain.  Gastrointestinal: Negative for abdominal pain, nausea and vomiting.  Musculoskeletal: Positive for arthralgias, back pain and myalgias.  Neurological: Negative for weakness and numbness.  All other systems reviewed and are negative.   Physical Exam Updated Vital Signs BP 109/72 (BP Location: Left Arm)   Pulse 92   Temp 99.1 F (37.3 C) (Oral)   Resp 18   SpO2 100%   Physical Exam Vitals and nursing note reviewed.  Constitutional:      General: He is not in acute distress.    Appearance: He is well-developed.  HENT:     Head: Normocephalic and atraumatic.  Eyes:     General:        Right eye: No discharge.        Left eye: No discharge.     Conjunctiva/sclera: Conjunctivae normal.  Neck:     Vascular:  No JVD.     Trachea: No tracheal deviation.  Cardiovascular:     Rate and Rhythm: Normal rate and regular rhythm.     Pulses: Normal pulses.  Pulmonary:     Effort: Pulmonary effort is normal.     Breath sounds: Normal breath sounds.  Abdominal:     General: There is no distension.  Musculoskeletal:     Comments: Low back tenderness at around the level of L5-S1 with bilateral paralumbar muscle tenderness.  No deformity, crepitus, or step-off.  5/5 strength of BLE major muscle groups.  Skin:    General: Skin is warm and dry.     Findings: No erythema.  Neurological:     Mental Status: He is alert.     Comments: Sensation intact to light touch of bilateral lower extremities, moves all extremities spontaneously  without difficulty  Psychiatric:        Behavior: Behavior normal.     ED Results / Procedures / Treatments   Labs (all labs ordered are listed, but only abnormal results are displayed) Labs Reviewed - No data to display  EKG None  Radiology No results found.  Procedures Procedures (including critical care time)  Medications Ordered in ED Medications  oxyCODONE-acetaminophen (PERCOCET/ROXICET) 5-325 MG per tablet 2 tablet (2 tablets Oral Given 05/01/19 0920)  ibuprofen (ADVIL) tablet 600 mg (600 mg Oral Given 05/01/19 0930)    ED Course  I have reviewed the triage vital signs and the nursing notes.  Pertinent labs & imaging results that were available during my care of the patient were reviewed by me and considered in my medical decision making (see chart for details).    MDM Rules/Calculators/A&P                      Patient presenting for evaluation of low back pain and bilateral lower extremity pain which he reports is consistent with his usual sickle cell pains.  He is afebrile, vital signs are stable.  He is nontoxic in appearance.  He is neurovascularly intact.  No concern for cauda equina or spinal abscess.  Abdomen soft and nontender.  [place]  controlled substance registry was queried which shows no overdose risk score of 610 and also notes that the patient ran out of his medicines approximately 13 days early.  He was seen and evaluated in the sickle cell day clinic yesterday and was given IV pain medicines, fluids, Tylenol and had good control of his pain.  He tells me his pain began last night and that he took the last tablet of his Percocet yesterday evening though told the provider that saw him in the clinic yesterday that he took his last tablet of Percocet yesterday morning so there are inconsistencies in his story.  Lab work yesterday showed improvement in his anemia, no renal insufficiency.  I am not concerned for acute chest syndrome, PE, CVA, aplastic anemia or splenic sequestration at this time.  9:25AM Spoke with Cammie Sickle, FNP at the Sickle Cell clinic.  We reviewed the patient's history and inconsistencies in his medication use.  She notes that she previously managed the patient's pain but due to recurrent misuse of medications she now no longer prescribes his pain medicines but remains his PCP.  She did see him in the sickle cell clinic yesterday.  She reports that she did tell the patient that until he can follow-up with his pain management provider he will need to manage his pains with nonnarcotic options.  She recommends only giving oral doses of home medicines, which I agree with but she also notes that it could be reasonable to send him to the sickle cell clinic where she will see him and give him home medicines orally.  She does not feel that it is appropriate for the patient to receive IV pain medicines at this time and I agree with this as well.   On reevaluation the patient is resting comfortably, sipping on fluids without difficulty.  He was advised of his options and initially told me that he would like doses of his home medicines here.  He then told his RN that he would like to go to the sickle cell clinic after he had  already received 2 tablets of Percocet 5/325 mg.  He understands that ultimately he will need to follow-up with  his pain management provider with whom he has a pain contract and who should be the only provider prescribing controlled substances for him.  He remains resting comfortably in no apparent distress and is hemodynamically stable for discharge at this time.  Final Clinical Impression(s) / ED Diagnoses Final diagnoses:  Sickle cell pain crisis [facility])    Rx / [place] Orders ED Discharge Orders    None       Debroah Baller 05/01/19 0936    Davonna Belling, MD 05/01/19 [phone]

## 2019-05-01 NOTE — Discharge Instructions (Addendum)
Alternate 600 mg of ibuprofen and [id number] mg of Tylenol every 3 hours as needed for pain. Do not exceed 4000 mg of Tylenol daily.  Take ibuprofen with food to avoid upset stomach issues.  Return to the emergency department if any concerning signs or symptoms develop.

## 2019-05-15 ENCOUNTER — Telehealth (HOSPITAL_COMMUNITY): Payer: Self-pay | Admitting: General Practice

## 2019-05-15 ENCOUNTER — Non-Acute Institutional Stay (HOSPITAL_COMMUNITY)
Admission: AD | Admit: 2019-05-15 | Discharge: 2019-05-15 | Disposition: A | Discharge status: Home / Self Care | Payer: Medicaid Other | Source: Ambulatory Visit | Attending: Internal Medicine | Admitting: Internal Medicine

## 2019-05-15 DIAGNOSIS — G894 Chronic pain syndrome: Secondary | ICD-10-CM | POA: Insufficient documentation

## 2019-05-15 DIAGNOSIS — D5700 Hb-SS disease with crisis, unspecified: Secondary | ICD-10-CM | POA: Diagnosis not present

## 2019-05-15 DIAGNOSIS — Z881 Allergy status to other antibiotic agents status: Secondary | ICD-10-CM | POA: Insufficient documentation

## 2019-05-15 DIAGNOSIS — D638 Anemia in other chronic diseases classified elsewhere: Secondary | ICD-10-CM | POA: Diagnosis not present

## 2019-05-15 DIAGNOSIS — Z888 Allergy status to other drugs, medicaments and biological substances status: Secondary | ICD-10-CM | POA: Diagnosis not present

## 2019-05-15 DIAGNOSIS — Z885 Allergy status to narcotic agent status: Secondary | ICD-10-CM | POA: Insufficient documentation

## 2019-05-15 DIAGNOSIS — E559 Vitamin D deficiency, unspecified: Secondary | ICD-10-CM | POA: Insufficient documentation

## 2019-05-15 DIAGNOSIS — M545 Low back pain: Secondary | ICD-10-CM | POA: Diagnosis present

## 2019-05-15 DIAGNOSIS — Z880 Allergy status to penicillin: Secondary | ICD-10-CM | POA: Insufficient documentation

## 2019-05-15 DIAGNOSIS — D571 Sickle-cell disease without crisis: Secondary | ICD-10-CM | POA: Diagnosis present

## 2019-05-15 DIAGNOSIS — Z79899 Other long term (current) drug therapy: Secondary | ICD-10-CM | POA: Insufficient documentation

## 2019-05-15 DIAGNOSIS — F1120 Opioid dependence, uncomplicated: Secondary | ICD-10-CM | POA: Insufficient documentation

## 2019-05-15 MED ORDER — SENNOSIDES-DOCUSATE SODIUM 8.6-50 MG PO TABS: 1.0000 | ORAL_TABLET | Freq: Two times a day (BID) | ORAL | Status: DC

## 2019-05-15 MED ORDER — DIPHENHYDRAMINE HCL 25 MG PO CAPS: 25.0000 mg | ORAL_CAPSULE | ORAL | Status: DC | PRN

## 2019-05-15 MED ORDER — POLYETHYLENE GLYCOL 3350 17 G PO PACK: 17.0000 g | PACK | Freq: Every day | ORAL | Status: DC | PRN

## 2019-05-15 MED ORDER — SODIUM CHLORIDE 0.9% FLUSH: 9.0000 mL | INTRAVENOUS | Status: DC | PRN

## 2019-05-15 MED ORDER — DEXTROSE-NACL 5-0.45 % IV SOLN: INTRAVENOUS | Status: DC

## 2019-05-15 MED ORDER — SODIUM CHLORIDE 0.9 % IV SOLN
25.0000 mg | INTRAVENOUS | Status: DC | PRN
  Filled 2019-05-15: qty 0.5

## 2019-05-15 MED ORDER — KETOROLAC TROMETHAMINE 15 MG/ML IJ SOLN
15.0000 mg | Freq: Four times a day (QID) | INTRAMUSCULAR | Status: DC
  Filled 2019-05-15: qty 1

## 2019-05-15 MED ORDER — ONDANSETRON HCL 4 MG/2ML IJ SOLN
4.0000 mg | Freq: Four times a day (QID) | INTRAMUSCULAR | Status: DC | PRN
  Administered 2019-05-15: 4 mg via INTRAVENOUS
  Filled 2019-05-15: qty 2

## 2019-05-15 MED ORDER — NALOXONE HCL 0.4 MG/ML IJ SOLN: 0.4000 mg | INTRAMUSCULAR | Status: DC | PRN

## 2019-05-15 MED ORDER — HYDROMORPHONE 1 MG/ML IV SOLN
INTRAVENOUS | Status: DC
  Administered 2019-05-15: 30 mg via INTRAVENOUS
  Filled 2019-05-15: qty 30

## 2019-05-15 NOTE — H&P (Signed)
[facility] History and Physical  Corell Orloff [id number] DOB: 1990-08-19 DOA: 05/15/2019  [facility]P: [facility]   Chief Complaint: Sickle cell pain  HPI: Timothy Marks is a 29 y.o. male with history of sickle cell disease, chronic pain syndrome, opiate dependence and tolerance, and anemia of chronic disease who presented to the day hospital today with major complaints of generalized body pain but especially in his lower back and lower extremity typical of his sickle cell pain crisis.  Patient claims the pain has slowly increased in intensity for the past several days and today was not relieved by his home pain medications.  He last took his Percocet this morning with no sustained relief.  He rates his pain at 10/10, characterized as throbbing and achy.  He denies fever, chills, headache, chest pain, shortness of breath, nausea, vomiting or diarrhea.  He denies any sick contact or recent travel, he denies exposure to COVID-19.  No urinary symptoms.  Systemic Review: General: The patient denies anorexia, fever, weight loss Cardiac: Denies chest pain, syncope, palpitations, pedal edema  Respiratory: Denies cough, shortness of breath, wheezing GI: Denies severe indigestion/heartburn, abdominal pain, nausea, vomiting, diarrhea and constipation GU: Denies hematuria, incontinence, dysuria  Musculoskeletal: Denies arthritis  Skin: Denies suspicious skin lesions Neurologic: Denies focal weakness or numbness, change in vision  Past Medical History:  Diagnosis Date  . Depression   . Generalized weakness 03/31/2019  . 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, left shoulder ([place]) y-3   associated with Hb SS  . Sickle cell anemia (HCC)   . Vitamin D deficiency y-6    Past Surgical History:  Procedure Laterality Date  . CHOLECYSTECTOMY      Allergies  Allergen Reactions  . Buprenorphine Hcl Hives  . Levaquin [Levofloxacin] Itching  . Meperidine Rash  .  Morphine Hives  . Morphine And Related Hives  . Toradol [Ketorolac Tromethamine] Itching  . Tramadol Hives  . Vancomycin Itching  . Zosyn [Piperacillin Sod-Tazobactam So] Itching and Rash    Has taken rocephin in past    Family History  Problem Relation Age of Onset  . Sickle cell trait Mother   . Sickle cell trait Father       Prior to Admission medications   Medication Sig Start Date End Date Taking? Authorizing Provider  cholecalciferol (VITAMIN D) 1000 units tablet Take 2 tablets (2,000 Units total) by mouth daily. 12/13/17   Azzie Glatter, FNP  folic acid (FOLVITE) 1 MG tablet Take 1 tablet (1 mg total) by mouth daily. 05/01/17   Dorena Dew, FNP  gabapentin (NEURONTIN) 300 MG capsule Take 1 capsule (300 mg total) by mouth 3 (three) times daily. 03/12/19   Dorena Dew, FNP  hydroxyurea (HYDREA) 500 MG capsule Take 1 capsule (500 mg total) by mouth 2 (two) times daily. May take with food to minimize GI side effects. 05/01/17   Dorena Dew, FNP  ibuprofen (ADVIL) 800 MG tablet Take 1 tablet (800 mg total) by mouth 3 (three) times daily as needed (pain). 03/12/19   Dorena Dew, FNP  Nutritional Supplements (ENSURE ACTIVE HIGH PROTEIN) LIQD Take 1 each by mouth 3 (three) times daily between meals. #90 cans of Ensure 11/13/18   Dorena Dew, FNP  oxyCODONE-acetaminophen (PERCOCET) 10-325 MG tablet Take 1 tablet by mouth every 6 (six) hours as needed for pain. 03/27/19 03/26/20  Dorena Dew, FNP  OXYCONTIN 20 MG 12 hr tablet Take 1 tablet (20 mg total) by  mouth every 12 (twelve) hours. 03/27/19   Dorena Dew, FNP     Physical Exam: There were no vitals filed for this visit.  General: Alert, awake, afebrile, anicteric, not in obvious distress HEENT: Normocephalic and Atraumatic, Mucous membranes pink                PERRLA; EOM intact; No scleral icterus,                 Nares: Patent, Oropharynx: Clear, Fair Dentition                 Neck: FROM,  no cervical lymphadenopathy, thyromegaly, carotid bruit or JVD;  CHEST WALL: No tenderness  CHEST: Normal respiration, clear to auscultation bilaterally  HEART: Regular rate and rhythm; no murmurs rubs or gallops  BACK: No kyphosis or scoliosis; no CVA tenderness  ABDOMEN: Positive Bowel Sounds, soft, non-tender; no masses, no organomegaly EXTREMITIES: No cyanosis, clubbing, or edema SKIN:  no rash or ulceration  CNS: Alert and Oriented x 4, Nonfocal exam, CN 2-12 intact  Labs on Admission:  Basic Metabolic Panel: No results for input(s): NA, K, CL, CO2, GLUCOSE, BUN, CREATININE, CALCIUM, MG, PHOS in the last 168 hours. Liver Function Tests: No results for input(s): AST, ALT, ALKPHOS, BILITOT, PROT, ALBUMIN in the last 168 hours. No results for input(s): LIPASE, AMYLASE in the last 168 hours. No results for input(s): AMMONIA in the last 168 hours. CBC: No results for input(s): WBC, NEUTROABS, HGB, HCT, MCV, PLT in the last 168 hours. Cardiac Enzymes: No results for input(s): CKTOTAL, CKMB, CKMBINDEX, TROPONINI in the last 168 hours.  BNP (last 3 results) No results for input(s): BNP in the last 8760 hours.  ProBNP (last 3 results) No results for input(s): PROBNP in the last 8760 hours.  CBG: No results for input(s): GLUCAP in the last 168 hours.   Assessment/Plan Active Problems:   Sickle cell anemia with pain (HCC)   Admits to the [facility]  IVF .45% Saline @ 125 mls/hour  Weight based Dilaudid PCA started within 30 minutes of admission  IV Toradol is contraindicated due to allergy  Monitor vitals very closely, Re-evaluate pain scale every hour  2 L of Oxygen by [place]  Patient will be re-evaluated for pain in the context of function and relationship to baseline as care progresses.  If no significant relieve from pain (remains above 5/10) will transfer patient to inpatient services for further evaluation and management  Code Status: Full  Family Communication:  None  DVT Prophylaxis: Ambulate as tolerated   Time spent: 35 Minutes  Angelica Chessman, MD, MHA, FACP, FAAP, CPE  If 7PM-7AM, please contact night-coverage [email or web address] 05/15/2019, 9:32 AM

## 2019-05-15 NOTE — Discharge Summary (Signed)
Physician Discharge Summary  Shea Duro [id number] DOB: 1990/08/12 DOA: 05/15/2019  [facility]P: [facility]  Admit date: 05/15/2019  Discharge date: 05/15/2019  Time spent: 30 minutes  Discharge Diagnoses:  Active Problems:   Sickle cell anemia with pain (HCC)  Discharge Condition: Stable  Diet recommendation: Regular  History of present illness:  Timothy Marks is a 29 y.o. male with history of sickle cell disease, chronic pain syndrome, opiate dependence and tolerance, and anemia of chronic disease who presented to the day hospital today with major complaints of generalized body pain but especially in his lower back and lower extremity typical of his sickle cell pain crisis.  Patient claims the pain has slowly increased in intensity for the past several days and today was not relieved by his home pain medications.  He last took his Percocet this morning with no sustained relief.  He rates his pain at 10/10, characterized as throbbing and achy.  He denies fever, chills, headache, chest pain, shortness of breath, nausea, vomiting or diarrhea.  He denies any sick contact or recent travel, he denies exposure to COVID-19.  No urinary symptoms.  Hospital Course:  Timothy Marks was admitted to the day hospital with sickle cell painful crisis. Patient was treated with weight based IV Dilaudid PCA, clinician assisted doses as deemed appropriate and IV fluids.  Toradol was contraindicated due to allergy. Timothy Marks showed significant improvement symptomatically, pain improved from 10 to 4/10 at the time of discharge. Patient was discharged home in a hemodynamically stable condition. Timothy Marks will follow-up at the clinic as previously scheduled, continue with home medications as per prior to admission.  Discharge Instructions We discussed the need for good hydration, monitoring of hydration status, avoidance of heat, cold, stress, and infection triggers. We discussed the need to be compliant  with taking Hydrea and other home medications. Timothy Marks was reminded of the need to seek medical attention immediately if any symptom of bleeding, anemia, or infection occurs.  Discharge Exam: Vitals:   05/15/19 1147 05/15/19 1429  BP: 115/72 115/69  Pulse: 80 82  Resp: 10 13  Temp: 98.6 F (37 C)   SpO2: 98% 99%   General appearance: alert, cooperative and no distress Eyes: conjunctivae/corneas clear. PERRL, EOM's intact. Fundi benign. Neck: no adenopathy, no carotid bruit, no JVD, supple, symmetrical, trachea midline and thyroid not enlarged, symmetric, no tenderness/mass/nodules Back: symmetric, no curvature. ROM normal. No CVA tenderness. Resp: clear to auscultation bilaterally Chest wall: no tenderness Cardio: regular rate and rhythm, S1, S2 normal, no murmur, click, rub or gallop GI: soft, non-tender; bowel sounds normal; no masses,  no organomegaly Extremities: extremities normal, atraumatic, no cyanosis or edema Pulses: 2+ and symmetric Skin: Skin color, texture, turgor normal. No rashes or lesions Neurologic: Grossly normal  Discharge Instructions    Diet - low sodium heart healthy   Complete by: As directed    Increase activity slowly   Complete by: As directed      Allergies as of 05/15/2019      Reactions   Buprenorphine Hcl Hives   Levaquin [levofloxacin] Itching   Meperidine Rash   Morphine Hives   Morphine And Related Hives   Toradol [ketorolac Tromethamine] Itching   Tramadol Hives   Vancomycin Itching   Zosyn [piperacillin Sod-tazobactam So] Itching, Rash   Has taken rocephin in past      Medication List    TAKE these medications   cholecalciferol 25 MCG (1000 UNIT) tablet Commonly known as: VITAMIN D Take 2 tablets (  2,000 Units total) by mouth daily.   Ensure Active High Protein Liqd Take 1 each by mouth 3 (three) times daily between meals. #90 cans of Ensure   folic acid 1 MG tablet Commonly known as: FOLVITE Take 1 tablet (1 mg total) by  mouth daily.   gabapentin 300 MG capsule Commonly known as: NEURONTIN Take 1 capsule (300 mg total) by mouth 3 (three) times daily.   hydroxyurea 500 MG capsule Commonly known as: HYDREA Take 1 capsule (500 mg total) by mouth 2 (two) times daily. May take with food to minimize GI side effects.   ibuprofen 800 MG tablet Commonly known as: ADVIL Take 1 tablet (800 mg total) by mouth 3 (three) times daily as needed (pain).   oxyCODONE-acetaminophen 10-325 MG tablet Commonly known as: Percocet Take 1 tablet by mouth every 6 (six) hours as needed for pain.   OxyCONTIN 20 mg 12 hr tablet Generic drug: oxyCODONE Take 1 tablet (20 mg total) by mouth every 12 (twelve) hours.      Allergies  Allergen Reactions  . Buprenorphine Hcl Hives  . Levaquin [Levofloxacin] Itching  . Meperidine Rash  . Morphine Hives  . Morphine And Related Hives  . Toradol [Ketorolac Tromethamine] Itching  . Tramadol Hives  . Vancomycin Itching  . Zosyn [Piperacillin Sod-Tazobactam So] Itching and Rash    Has taken rocephin in past     Significant Diagnostic Studies: No results found.  Signed:  Angelica Chessman MD, MHA, Karilyn Cota, CPE   05/15/2019, 5:47 PM

## 2019-05-15 NOTE — Discharge Instructions (Signed)
Sickle Cell Anemia, Adult  Sickle cell anemia is a condition where your red blood cells are shaped like sickles. Red blood cells carry oxygen through the body. Sickle-shaped cells do not live as long as normal red blood cells. They also clump together and block blood from flowing through the blood vessels. This prevents the body from getting enough oxygen. Sickle cell anemia causes organ damage and pain. It also increases the risk of infection. Follow these instructions at home: Medicines  Take over-the-counter and prescription medicines only as told by your doctor.  If you were prescribed an antibiotic medicine, take it as told by your doctor. Do not stop taking the antibiotic even if you start to feel better.  If you develop a fever, do not take medicines to lower the fever right away. Tell your doctor about the fever. Managing pain, stiffness, and swelling  Try these methods to help with pain: ? Use a heating pad. ? Take a warm bath. ? Distract yourself, such as by watching TV. Eating and drinking  Drink enough fluid to keep your pee (urine) clear or pale yellow. Drink more in hot weather and during exercise.  Limit or avoid alcohol.  Eat a healthy diet. Eat plenty of fruits, vegetables, whole grains, and lean protein.  Take vitamins and supplements as told by your doctor. Traveling  When traveling, keep these with you: ? Your medical information. ? The names of your doctors. ? Your medicines.  If you need to take an airplane, talk to your doctor first. Activity  Rest often.  Avoid exercises that make your heart beat much faster, such as jogging. General instructions  Do not use products that have nicotine or tobacco, such as cigarettes and e-cigarettes. If you need help quitting, ask your doctor.  Consider wearing a medical alert bracelet.  Avoid being in high places (high altitudes), such as mountains.  Avoid very hot or cold temperatures.  Avoid places where the  temperature changes a lot.  Keep all follow-up visits as told by your doctor. This is important. Contact a doctor if:  A joint hurts.  Your feet or hands hurt or swell.  You feel tired (fatigued). Get help right away if:  You have symptoms of infection. These include: ? Fever. ? Chills. ? Being very tired. ? Irritability. ? Poor eating. ? Throwing up (vomiting).  You feel dizzy or faint.  You have new stomach pain, especially on the left side.  You have a an erection (priapism) that lasts more than 4 hours.  You have numbness in your arms or legs.  You have a hard time moving your arms or legs.  You have trouble talking.  You have pain that does not go away when you take medicine.  You are short of breath.  You are breathing fast.  You have a long-term cough.  You have pain in your chest.  You have a bad headache.  You have a stiff neck.  Your stomach looks bloated even though you did not eat much.  Your skin is pale.  You suddenly cannot see well. Summary  Sickle cell anemia is a condition where your red blood cells are shaped like sickles.  Follow your doctor's advice on ways to manage pain, food to eat, activities to do, and steps to take for safe travel.  Get medical help right away if you have any signs of infection, such as a fever. This information is not intended to replace advice given to you by   your health care provider. Make sure you discuss any questions you have with your health care provider. Document Revised: 05/25/2018 Document Reviewed: 03/08/2016 Elsevier Patient Education  2020 Elsevier Inc.  

## 2019-05-15 NOTE — Progress Notes (Signed)
0950 Pt arrived at the [facility] [place] [facility] for sickle cell pain. Pt A&Ox4, ambulatory and resting in bed. IV started and VSS. Pt reports pain is 8/10. Dr. Doreene Burke called for orders. WCTM.   1000 Orders received, IVF infusing. WCTM.   1025 PCA set up and infusing, WCTM.    1310 Zofran given for nausea, WCTM.   1430 Pt discharged home, pt states pain is a 4/10, RN reviewed discharge paperwork with pt, VSS, IV removed without difficulty. Pt A&Ox4, ambulatory on way out of the Patient [place].

## 2019-05-15 NOTE — Telephone Encounter (Signed)
Patient called, complained of pain in the Back rated at 8/10. Denied chest pain, fever, diarrhea, abdominal pain, nausea/vomitting and priapism. Screened negative for Covid-19 symptoms. Admitted to having means of transportation without driving self after treatment. Last took 1 tablet of Percocet at 22:00 pm yesterday. Per provider, patient can come to the day hospital for treatment. Patient notified, verbalized understanding.

## 2019-05-20 ENCOUNTER — Telehealth (HOSPITAL_COMMUNITY): Payer: Self-pay

## 2019-05-20 ENCOUNTER — Non-Acute Institutional Stay (HOSPITAL_COMMUNITY)
Admission: AD | Admit: 2019-05-20 | Discharge: 2019-05-20 | Disposition: A | Discharge status: Home / Self Care | Payer: Medicaid Other | Source: Ambulatory Visit | Attending: Internal Medicine | Admitting: Internal Medicine

## 2019-05-20 DIAGNOSIS — D5700 Hb-SS disease with crisis, unspecified: Secondary | ICD-10-CM | POA: Diagnosis not present

## 2019-05-20 DIAGNOSIS — Z79891 Long term (current) use of opiate analgesic: Secondary | ICD-10-CM | POA: Insufficient documentation

## 2019-05-20 DIAGNOSIS — G894 Chronic pain syndrome: Secondary | ICD-10-CM | POA: Insufficient documentation

## 2019-05-20 DIAGNOSIS — Z885 Allergy status to narcotic agent status: Secondary | ICD-10-CM | POA: Diagnosis not present

## 2019-05-20 DIAGNOSIS — Z79899 Other long term (current) drug therapy: Secondary | ICD-10-CM | POA: Diagnosis not present

## 2019-05-20 DIAGNOSIS — M90512 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, left shoulder: Secondary | ICD-10-CM | POA: Insufficient documentation

## 2019-05-20 DIAGNOSIS — D638 Anemia in other chronic diseases classified elsewhere: Secondary | ICD-10-CM | POA: Diagnosis not present

## 2019-05-20 DIAGNOSIS — Z880 Allergy status to penicillin: Secondary | ICD-10-CM | POA: Insufficient documentation

## 2019-05-20 DIAGNOSIS — Z881 Allergy status to other antibiotic agents status: Secondary | ICD-10-CM | POA: Insufficient documentation

## 2019-05-20 LAB — CBC WITH DIFFERENTIAL/PLATELET
Abs Immature Granulocytes: 0.02 10*3/uL (ref 0.00–0.07)
Basophils Absolute: 0 10*3/uL (ref 0.0–0.1)
Basophils Relative: 0 %
Eosinophils Absolute: 0.3 10*3/uL (ref 0.0–0.5)
Eosinophils Relative: 4 %
HCT: 34.2 % — ABNORMAL LOW (ref 39.0–52.0)
Hemoglobin: 11.3 g/dL — ABNORMAL LOW (ref 13.0–17.0)
Immature Granulocytes: 0 %
Lymphocytes Relative: 24 %
Lymphs Abs: 1.8 10*3/uL (ref 0.7–4.0)
MCH: 25.8 pg — ABNORMAL LOW (ref 26.0–34.0)
MCHC: 33 g/dL (ref 30.0–36.0)
MCV: 78.1 fL — ABNORMAL LOW (ref 80.0–100.0)
Monocytes Absolute: 0.5 10*3/uL (ref 0.1–1.0)
Monocytes Relative: 7 %
Neutro Abs: 4.8 10*3/uL (ref 1.7–7.7)
Neutrophils Relative %: 65 %
Platelets: 494 10*3/uL — ABNORMAL HIGH (ref 150–400)
RBC: 4.38 MIL/uL (ref 4.22–5.81)
RDW: 23.3 % — ABNORMAL HIGH (ref 11.5–15.5)
WBC: 7.5 10*3/uL (ref 4.0–10.5)
nRBC: 1.1 % — ABNORMAL HIGH (ref 0.0–0.2)

## 2019-05-20 LAB — COMPREHENSIVE METABOLIC PANEL
ALT: 17 U/L (ref 0–44)
AST: 28 U/L (ref 15–41)
Albumin: 4.7 g/dL (ref 3.5–5.0)
Alkaline Phosphatase: 87 U/L (ref 38–126)
Anion gap: 9 (ref 5–15)
BUN: 6 mg/dL (ref 6–20)
CO2: 29 mmol/L (ref 22–32)
Calcium: 9.7 mg/dL (ref 8.9–10.3)
Chloride: 104 mmol/L (ref 98–111)
Creatinine, Ser: 0.56 mg/dL — ABNORMAL LOW (ref 0.61–1.24)
GFR calc Af Amer: >60 mL/min (ref >60)
GFR calc non Af Amer: >60 mL/min (ref >60)
Glucose, Bld: 86 mg/dL (ref 70–99)
Potassium: 3.7 mmol/L (ref 3.5–5.1)
Sodium: 142 mmol/L (ref 135–145)
Total Bilirubin: 2 mg/dL — ABNORMAL HIGH (ref 0.3–1.2)
Total Protein: 8.6 g/dL — ABNORMAL HIGH (ref 6.5–8.1)

## 2019-05-20 LAB — LACTATE DEHYDROGENASE: LDH: 226 U/L — ABNORMAL HIGH (ref 98–192)

## 2019-05-20 MED ORDER — SODIUM CHLORIDE 0.9% FLUSH: 9.0000 mL | INTRAVENOUS | Status: DC | PRN

## 2019-05-20 MED ORDER — IBUPROFEN 400 MG PO TABS
800.0000 mg | ORAL_TABLET | ORAL | Status: DC
  Administered 2019-05-20: 800 mg via ORAL
  Filled 2019-05-20: qty 2

## 2019-05-20 MED ORDER — ONDANSETRON HCL 4 MG/2ML IJ SOLN
4.0000 mg | Freq: Four times a day (QID) | INTRAMUSCULAR | Status: DC | PRN
  Administered 2019-05-20: 4 mg via INTRAVENOUS
  Filled 2019-05-20: qty 2

## 2019-05-20 MED ORDER — NALOXONE HCL 0.4 MG/ML IJ SOLN: 0.4000 mg | INTRAMUSCULAR | Status: DC | PRN

## 2019-05-20 MED ORDER — POLYETHYLENE GLYCOL 3350 17 G PO PACK: 17.0000 g | PACK | Freq: Every day | ORAL | Status: DC | PRN

## 2019-05-20 MED ORDER — SODIUM CHLORIDE 0.9 % IV SOLN
25.0000 mg | INTRAVENOUS | Status: DC | PRN
  Filled 2019-05-20: qty 0.5

## 2019-05-20 MED ORDER — SENNOSIDES-DOCUSATE SODIUM 8.6-50 MG PO TABS: 1.0000 | ORAL_TABLET | Freq: Two times a day (BID) | ORAL | Status: DC

## 2019-05-20 MED ORDER — HYDROMORPHONE 1 MG/ML IV SOLN
INTRAVENOUS | Status: DC
  Administered 2019-05-20: 30 mg via INTRAVENOUS
  Filled 2019-05-20: qty 30

## 2019-05-20 MED ORDER — DEXTROSE-NACL 5-0.45 % IV SOLN: INTRAVENOUS | Status: DC

## 2019-05-20 MED ORDER — DIPHENHYDRAMINE HCL 25 MG PO CAPS: 25.0000 mg | ORAL_CAPSULE | ORAL | Status: DC | PRN

## 2019-05-20 NOTE — Discharge Summary (Signed)
Physician Discharge Summary  [name] [id number] DOB: 03/12/1990 DOA: 05/20/2019  PCP: [facility]  Admit date: 05/20/2019  Discharge date: 05/20/2019  Time spent: 30 minutes  Discharge Diagnoses:  Active Problems:   Sickle cell anemia with pain (HCC)  Discharge Condition: Stable  Diet recommendation: Regular  History of present illness:  Timothy Marks is a 29 y.o. male with history of sickle cell disease, chronic pain syndrome, opiate dependence and tolerance, and anemia of chronic disease who presented to the day hospital this morning with major complaints of generalized body pain especially in his lower back and lower extremities typical of his sickle cell pain crisis.  Patient last took his home pain medication this morning with no sustained relief.  He rates his pain at 10/10 on arrival, characterized as throbbing and constant.  He denies any fever, chills, headache, chest pain, cough, shortness of breath, nausea, vomiting or diarrhea.  He denies any sick contact or exposure to COVID-19.  Hospital Course:  Emmytt Reggio was admitted to the day hospital with sickle cell painful crisis. Patient was treated with weight based IV Dilaudid PCA, clinician assisted doses was started as deemed appropriate and IV fluids, IV Toradol is contraindicated due to allergy. Deveyon showed significant improvement symptomatically, pain improved from 10 to 5/10 at the time of discharge. Patient was discharged home in a hemodynamically stable condition. [name] will follow-up at the clinic as previously scheduled, continue with home medications as per prior to admission.  Discharge Instructions We discussed the need for good hydration, monitoring of hydration status, avoidance of heat, cold, stress, and infection triggers. We discussed the need to be compliant with taking Hydrea and other home medications. [name] was reminded of the need to seek medical attention immediately if any symptom  of bleeding, anemia, or infection occurs.  Discharge Exam: Vitals:   05/20/19 1031 05/20/19 1220  BP: 128/69 124/67  Pulse: 77 77  Resp: 14 16  Temp: 99.7 F (37.6 C)   SpO2: 100% 98%    General appearance: alert, cooperative and no distress Eyes: conjunctivae/corneas clear. PERRL, EOM's intact. Fundi benign. Neck: no adenopathy, no carotid bruit, no JVD, supple, symmetrical, trachea midline and thyroid not enlarged, symmetric, no tenderness/mass/nodules Back: symmetric, no curvature. ROM normal. No CVA tenderness. Resp: clear to auscultation bilaterally Chest wall: no tenderness Cardio: regular rate and rhythm, S1, S2 normal, no murmur, click, rub or gallop GI: soft, non-tender; bowel sounds normal; no masses,  no organomegaly Extremities: extremities normal, atraumatic, no cyanosis or edema Pulses: 2+ and symmetric Skin: Skin color, texture, turgor normal. No rashes or lesions Neurologic: Grossly normal  Discharge Instructions    Diet - low sodium heart healthy   Complete by: As directed    Increase activity slowly   Complete by: As directed      Allergies as of 05/20/2019      Reactions   Buprenorphine Hcl Hives   Levaquin [levofloxacin] Itching   Meperidine Rash   Morphine Hives   Morphine And Related Hives   Toradol [ketorolac Tromethamine] Itching   Tramadol Hives   Vancomycin Itching   Zosyn [piperacillin Sod-tazobactam So] Itching, Rash   Has taken rocephin in past      Medication List    TAKE these medications   cholecalciferol 25 MCG (1000 UNIT) tablet Commonly known as: VITAMIN D Take 2 tablets (2,000 Units total) by mouth daily.   Ensure Active High Protein Liqd Take 1 each by mouth 3 (three) times daily between meals. #  90 cans of Ensure   folic acid 1 MG tablet Commonly known as: FOLVITE Take 1 tablet (1 mg total) by mouth daily.   gabapentin 300 MG capsule Commonly known as: NEURONTIN Take 1 capsule (300 mg total) by mouth 3 (three) times  daily.   hydroxyurea 500 MG capsule Commonly known as: HYDREA Take 1 capsule (500 mg total) by mouth 2 (two) times daily. May take with food to minimize GI side effects.   ibuprofen 800 MG tablet Commonly known as: ADVIL Take 1 tablet (800 mg total) by mouth 3 (three) times daily as needed (pain).   oxyCODONE-acetaminophen 10-325 MG tablet Commonly known as: Percocet Take 1 tablet by mouth every 6 (six) hours as needed for pain.   OxyCONTIN 20 mg 12 hr tablet Generic drug: oxyCODONE Take 1 tablet (20 mg total) by mouth every 12 (twelve) hours.      Allergies  Allergen Reactions  . Buprenorphine Hcl Hives  . Levaquin [Levofloxacin] Itching  . Meperidine Rash  . Morphine Hives  . Morphine And Related Hives  . Toradol [Ketorolac Tromethamine] Itching  . Tramadol Hives  . Vancomycin Itching  . Zosyn [Piperacillin Sod-Tazobactam So] Itching and Rash    Has taken rocephin in past     Significant Diagnostic Studies: No results found.  Signed:  Angelica Chessman MD, [place], [place], Julio Sicks, CPE   05/20/2019, 1:37 PM

## 2019-05-20 NOTE — Telephone Encounter (Signed)
0900  Patient called the Patient Timothy Marks c/o sickle cell pain 9/10 is his back. He stated he last took percocet 10/325 on 4/4 around 2200. He states he was last seen in the ED, "a couple of weeks ago". He denies fever, CP, n/v/d, abdominal pain, priapism, and covid like symptoms. He stated he would have transportation at time of discharge if he was admitted. RN stated she would call Dr. Doreene Burke and call him back with further instruction.

## 2019-05-20 NOTE — Progress Notes (Signed)
1000 Pt [facility] at [facility] for [facility] pain. Pt rates pain a 8/10 in his back. Pt resting in bed, WCTM.   1030 IV started x2 unsuccessful. [name], RN attempted IV x2, unsuccessfully as well. IV consult placed for IV insertion. Pt medicated per MAR with ibuprofen. WCTM.   1100 IV team here. Successfully placed IV. RN started IVF, dilaudid PCA pump. WCTM.   1220 Pt resting in bed, states pain is 7/10, VSS, WCTM.   1300 Pt c/o nausea, zofran IV given per MAR.   1330 Pt stated he was ready for discharge. Dr. Doreene Burke paged for orders. Pt rates pain is 5/10.   1345 RN discharged pt per orders, IV removed without difficulty. RN reviewed discharge paperwork, pt understands without assistance, pt is A&Ox4, ambulatory on discharge with stable VS.

## 2019-05-20 NOTE — H&P (Addendum)
[facility] History and Physical  Heitor Gwynn [id number] DOB: [birth year] DOA: 05/20/2019  PCP: [facility]   Chief Complaint: Sickle cell pain  HPI: Timothy Marks is a 29 y.o. male with history of sickle cell disease, chronic pain syndrome, opiate dependence and tolerance, and anemia of chronic disease who presented to the day hospital this morning with major complaints of generalized body pain especially in his lower back and lower extremities typical of his sickle cell pain crisis.  Patient last took his home pain medication this morning with no sustained relief.  He rates his pain at 10/10 on arrival, characterized as throbbing and constant.  He denies any fever, chills, headache, chest pain, cough, shortness of breath, nausea, vomiting or diarrhea.  He denies any sick contact or exposure to COVID-19.  Systemic Review: General: The patient denies anorexia, fever, weight loss Cardiac: Denies chest pain, syncope, palpitations, pedal edema  Respiratory: Denies cough, shortness of breath, wheezing GI: Denies severe indigestion/heartburn, abdominal pain, nausea, vomiting, diarrhea and constipation GU: Denies hematuria, incontinence, dysuria  Musculoskeletal: Denies arthritis  Skin: Denies suspicious skin lesions Neurologic: Denies focal weakness or numbness, change in vision  Past Medical History:  Diagnosis Date  . Depression   . Generalized weakness 03/31/2019  . 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 Sickle cell anemia (HCC)   . Vitamin D deficiency y-6    Past Surgical History:  Procedure Laterality Date  . CHOLECYSTECTOMY      Allergies  Allergen Reactions  . Buprenorphine Hcl Hives  . Levaquin [Levofloxacin] Itching  . Meperidine Rash  . Morphine Hives  . Morphine And Related Hives  . Toradol [Ketorolac Tromethamine] Itching  . Tramadol Hives  . Vancomycin Itching  . Zosyn  [Piperacillin Sod-Tazobactam So] Itching and Rash    Has taken rocephin in past    Family History  Problem Relation Age of Onset  . Sickle cell trait Mother   . Sickle cell trait Father       Prior to Admission medications   Medication Sig Start Date End Date Taking? Authorizing Provider  cholecalciferol (VITAMIN D) 1000 units tablet Take 2 tablets (2,000 Units total) by mouth daily. 12/13/17   Azzie Glatter, FNP  folic acid (FOLVITE) 1 MG tablet Take 1 tablet (1 mg total) by mouth daily. 05/01/17   Dorena Dew, FNP  gabapentin (NEURONTIN) 300 MG capsule Take 1 capsule (300 mg total) by mouth 3 (three) times daily. 03/12/19   Dorena Dew, FNP  hydroxyurea (HYDREA) 500 MG capsule Take 1 capsule (500 mg total) by mouth 2 (two) times daily. May take with food to minimize GI side effects. 05/01/17   Dorena Dew, FNP  ibuprofen (ADVIL) 800 MG tablet Take 1 tablet (800 mg total) by mouth 3 (three) times daily as needed (pain). 03/12/19   Dorena Dew, FNP  Nutritional Supplements (ENSURE ACTIVE HIGH PROTEIN) LIQD Take 1 each by mouth 3 (three) times daily between meals. #90 cans of Ensure 11/13/18   Dorena Dew, FNP  oxyCODONE-acetaminophen (PERCOCET) 10-325 MG tablet Take 1 tablet by mouth every 6 (six) hours as needed for pain. 03/27/19 03/26/20  Dorena Dew, FNP  OXYCONTIN 20 MG 12 hr tablet Take 1 tablet (20 mg total) by mouth every 12 (twelve) hours. 03/27/19   Dorena Dew, FNP     Physical Exam: There were no vitals filed for this visit.  General: Alert, awake, afebrile, anicteric, not in obvious distress HEENT: Normocephalic and Atraumatic, Mucous membranes pink                PERRLA; EOM intact; No scleral icterus,                 Nares: Patent, Oropharynx: Clear, Fair Dentition                 Neck: FROM, no cervical lymphadenopathy, thyromegaly, carotid bruit or JVD;  CHEST WALL: No tenderness  CHEST: Normal respiration, clear to auscultation  bilaterally  HEART: Regular rate and rhythm; no murmurs rubs or gallops  BACK: No kyphosis or scoliosis; no CVA tenderness  ABDOMEN: Positive Bowel Sounds, soft, non-tender; no masses, no organomegaly EXTREMITIES: No cyanosis, clubbing, or edema SKIN:  no rash or ulceration  CNS: Alert and Oriented x 4, Nonfocal exam, CN 2-12 intact  Labs on Admission:  Basic Metabolic Panel: No results for input(s): NA, K, CL, CO2, GLUCOSE, BUN, CREATININE, CALCIUM, MG, PHOS in the last 168 hours. Liver Function Tests: No results for input(s): AST, ALT, ALKPHOS, BILITOT, PROT, ALBUMIN in the last 168 hours. No results for input(s): LIPASE, AMYLASE in the last 168 hours. No results for input(s): AMMONIA in the last 168 hours. CBC: No results for input(s): WBC, NEUTROABS, HGB, HCT, MCV, PLT in the last 168 hours. Cardiac Enzymes: No results for input(s): CKTOTAL, CKMB, CKMBINDEX, TROPONINI in the last 168 hours.  BNP (last 3 results) No results for input(s): BNP in the last 8760 hours.  ProBNP (last 3 results) No results for input(s): PROBNP in the last 8760 hours.  CBG: No results for input(s): GLUCAP in the last 168 hours.   Assessment/Plan Active Problems:   Sickle cell anemia with pain (HCC)   Admits to the Day Hospital  IVF D5 .45% Saline @ 125 mls/hour  Weight based Dilaudid PCA started within 30 minutes of admission  IV Toradol is contraindicated due to allergy.  Monitor vitals very closely, Re-evaluate pain scale every hour  2 L of Oxygen by [place]  Patient will be re-evaluated for pain in the context of function and relationship to baseline as care progresses.  If no significant relieve from pain (remains above 5/10) will transfer patient to inpatient services for further evaluation and management  Code Status: Full  Family Communication: None  DVT Prophylaxis: Ambulate as tolerated   Time spent: 35 Minutes  Angelica Chessman, MD, MHA, FACP, FAAP, CPE  If 7PM-7AM,  please contact night-coverage [email or web address] 05/20/2019, 9:40 AM

## 2019-05-24 ENCOUNTER — Telehealth (HOSPITAL_COMMUNITY): Payer: Self-pay | Admitting: General Practice

## 2019-05-24 ENCOUNTER — Encounter (HOSPITAL_COMMUNITY): Payer: Self-pay | Admitting: Family Medicine

## 2019-05-24 ENCOUNTER — Non-Acute Institutional Stay (HOSPITAL_COMMUNITY)
Admission: AD | Admit: 2019-05-24 | Discharge: 2019-05-24 | Disposition: A | Discharge status: Home / Self Care | Payer: Medicaid Other | Source: Ambulatory Visit | Attending: Internal Medicine | Admitting: Internal Medicine

## 2019-05-24 DIAGNOSIS — D5700 Hb-SS disease with crisis, unspecified: Secondary | ICD-10-CM | POA: Diagnosis present

## 2019-05-24 DIAGNOSIS — Z880 Allergy status to penicillin: Secondary | ICD-10-CM | POA: Diagnosis not present

## 2019-05-24 DIAGNOSIS — Z885 Allergy status to narcotic agent status: Secondary | ICD-10-CM | POA: Diagnosis not present

## 2019-05-24 DIAGNOSIS — G894 Chronic pain syndrome: Secondary | ICD-10-CM | POA: Insufficient documentation

## 2019-05-24 DIAGNOSIS — M79606 Pain in leg, unspecified: Secondary | ICD-10-CM | POA: Insufficient documentation

## 2019-05-24 DIAGNOSIS — Z888 Allergy status to other drugs, medicaments and biological substances status: Secondary | ICD-10-CM | POA: Insufficient documentation

## 2019-05-24 DIAGNOSIS — Z832 Family history of diseases of the blood and blood-forming organs and certain disorders involving the immune mechanism: Secondary | ICD-10-CM | POA: Diagnosis not present

## 2019-05-24 DIAGNOSIS — M545 Low back pain: Secondary | ICD-10-CM | POA: Diagnosis not present

## 2019-05-24 DIAGNOSIS — Z79899 Other long term (current) drug therapy: Secondary | ICD-10-CM | POA: Diagnosis not present

## 2019-05-24 DIAGNOSIS — Z881 Allergy status to other antibiotic agents status: Secondary | ICD-10-CM | POA: Insufficient documentation

## 2019-05-24 DIAGNOSIS — F1120 Opioid dependence, uncomplicated: Secondary | ICD-10-CM | POA: Insufficient documentation

## 2019-05-24 LAB — CBC WITH DIFFERENTIAL/PLATELET
Abs Immature Granulocytes: 0.02 10*3/uL (ref 0.00–0.07)
Basophils Absolute: 0 10*3/uL (ref 0.0–0.1)
Basophils Relative: 0 %
Eosinophils Absolute: 0.2 10*3/uL (ref 0.0–0.5)
Eosinophils Relative: 3 %
HCT: 34.3 % — ABNORMAL LOW (ref 39.0–52.0)
Hemoglobin: 11.5 g/dL — ABNORMAL LOW (ref 13.0–17.0)
Immature Granulocytes: 0 %
Lymphocytes Relative: 27 %
Lymphs Abs: 2 10*3/uL (ref 0.7–4.0)
MCH: 26.9 pg (ref 26.0–34.0)
MCHC: 33.5 g/dL (ref 30.0–36.0)
MCV: 80.1 fL (ref 80.0–100.0)
Monocytes Absolute: 0.5 10*3/uL (ref 0.1–1.0)
Monocytes Relative: 7 %
Neutro Abs: 4.7 10*3/uL (ref 1.7–7.7)
Neutrophils Relative %: 63 %
Platelets: 457 10*3/uL — ABNORMAL HIGH (ref 150–400)
RBC: 4.28 MIL/uL (ref 4.22–5.81)
RDW: 25.3 % — ABNORMAL HIGH (ref 11.5–15.5)
WBC: 7.5 10*3/uL (ref 4.0–10.5)
nRBC: 1.3 % — ABNORMAL HIGH (ref 0.0–0.2)

## 2019-05-24 LAB — COMPREHENSIVE METABOLIC PANEL
ALT: 14 U/L (ref 0–44)
AST: 25 U/L (ref 15–41)
Albumin: 4.6 g/dL (ref 3.5–5.0)
Alkaline Phosphatase: 86 U/L (ref 38–126)
Anion gap: 10 (ref 5–15)
BUN: 6 mg/dL (ref 6–20)
CO2: 24 mmol/L (ref 22–32)
Calcium: 9.5 mg/dL (ref 8.9–10.3)
Chloride: 105 mmol/L (ref 98–111)
Creatinine, Ser: 0.59 mg/dL — ABNORMAL LOW (ref 0.61–1.24)
GFR calc Af Amer: >60 mL/min (ref >60)
GFR calc non Af Amer: >60 mL/min (ref >60)
Glucose, Bld: 101 mg/dL — ABNORMAL HIGH (ref 70–99)
Potassium: 3.7 mmol/L (ref 3.5–5.1)
Sodium: 139 mmol/L (ref 135–145)
Total Bilirubin: 2.2 mg/dL — ABNORMAL HIGH (ref 0.3–1.2)
Total Protein: 8.3 g/dL — ABNORMAL HIGH (ref 6.5–8.1)

## 2019-05-24 LAB — RETICULOCYTES
Immature Retic Fract: 37.6 % — ABNORMAL HIGH (ref 2.3–15.9)
RBC.: 4.27 MIL/uL (ref 4.22–5.81)
Retic Count, Absolute: 295.5 10*3/uL — ABNORMAL HIGH (ref 19.0–186.0)
Retic Ct Pct: 6.9 % — ABNORMAL HIGH (ref 0.4–3.1)

## 2019-05-24 MED ORDER — DIPHENHYDRAMINE HCL 25 MG PO CAPS
25.0000 mg | ORAL_CAPSULE | ORAL | Status: DC | PRN
  Administered 2019-05-24: 25 mg via ORAL
  Filled 2019-05-24: qty 1

## 2019-05-24 MED ORDER — SODIUM CHLORIDE 0.9 % IV SOLN
25.0000 mg | INTRAVENOUS | Status: DC | PRN
  Filled 2019-05-24: qty 0.5

## 2019-05-24 MED ORDER — SODIUM CHLORIDE 0.9% FLUSH: 9.0000 mL | INTRAVENOUS | Status: DC | PRN

## 2019-05-24 MED ORDER — HYDROMORPHONE 1 MG/ML IV SOLN
INTRAVENOUS | Status: DC
  Administered 2019-05-24: 30 mg via INTRAVENOUS
  Administered 2019-05-24: 10 mg via INTRAVENOUS
  Filled 2019-05-24: qty 30

## 2019-05-24 MED ORDER — NALOXONE HCL 0.4 MG/ML IJ SOLN: 0.4000 mg | INTRAMUSCULAR | Status: DC | PRN

## 2019-05-24 MED ORDER — SODIUM CHLORIDE 0.45 % IV SOLN: INTRAVENOUS | Status: DC

## 2019-05-24 MED ORDER — ONDANSETRON HCL 4 MG/2ML IJ SOLN: 4.0000 mg | Freq: Four times a day (QID) | INTRAMUSCULAR | Status: DC | PRN

## 2019-05-24 MED ORDER — KETOROLAC TROMETHAMINE 30 MG/ML IJ SOLN
15.0000 mg | Freq: Once | INTRAMUSCULAR | Status: AC
  Administered 2019-05-24: 09:00:00 15 mg via INTRAVENOUS
  Filled 2019-05-24: qty 1

## 2019-05-24 MED ORDER — ACETAMINOPHEN 500 MG PO TABS
1000.0000 mg | ORAL_TABLET | Freq: Once | ORAL | Status: AC
  Administered 2019-05-24: 09:00:00 1000 mg via ORAL
  Filled 2019-05-24: qty 2

## 2019-05-24 NOTE — H&P (Signed)
[facility] History and Physical   Date: 05/24/2019  Patient name: Timothy Marks Medical record number: [id number] Date of birth: Feb 06, 1991 Age: 29 y.o. Gender: male PCP: [facility]  Attending physician: Tresa Garter, MD  Chief Complaint: Sickle cell pain  History of Present Illness: Abhik Galinski, a 29 year old male with a medical history significant for sickle cell disease, chronic pain syndrome, opiate dependence and tolerance, and history of oppositional defiance disorder presents complaining of low back and lower extremity pain that is consistent with his typical pain crisis.  Patient says that he awakened with increased pain this a.m, which was unrelieved by Tylenol 500 mg.  Patient attributes current crisis to being out of pain medications over the past several weeks.  He says that he is in the process of finding a new provider.  Pain intensity is 8/10 characterized as constant and aching.  He denies fever, chills, chest pain, shortness of breath, urinary symptoms, nausea, vomiting, or diarrhea.  He has had no sick contacts, recent travel, or exposure to COVID-19.  Meds: Medications Prior to Admission  Medication Sig Dispense Refill Last Dose  . cholecalciferol (VITAMIN D) 1000 units tablet Take 2 tablets (2,000 Units total) by mouth daily. 30 tablet 6   . folic acid (FOLVITE) 1 MG tablet Take 1 tablet (1 mg total) by mouth daily. 90 tablet 2   . gabapentin (NEURONTIN) 300 MG capsule Take 1 capsule (300 mg total) by mouth 3 (three) times daily. 90 capsule 5   . hydroxyurea (HYDREA) 500 MG capsule Take 1 capsule (500 mg total) by mouth 2 (two) times daily. May take with food to minimize GI side effects. 180 capsule 2   . ibuprofen (ADVIL) 800 MG tablet Take 1 tablet (800 mg total) by mouth 3 (three) times daily as needed (pain). 30 tablet 0   . Nutritional Supplements (ENSURE ACTIVE HIGH PROTEIN) LIQD Take 1 each by mouth 3 (three) times daily between  meals. #90 cans of Ensure 237 mL 11   . oxyCODONE-acetaminophen (PERCOCET) 10-325 MG tablet Take 1 tablet by mouth every 6 (six) hours as needed for pain. 20 tablet 0   . OXYCONTIN 20 MG 12 hr tablet Take 1 tablet (20 mg total) by mouth every 12 (twelve) hours. 10 tablet 0     Allergies: Buprenorphine hcl, Levaquin [levofloxacin], Meperidine, Morphine, Morphine and related, Toradol [ketorolac tromethamine], Tramadol, Vancomycin, and Zosyn [piperacillin sod-tazobactam so] Past Medical History:  Diagnosis Date  . Depression   . Generalized weakness 03/31/2019  . 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 Sickle cell anemia (HCC)   . Vitamin D deficiency y-6   Past Surgical History:  Procedure Laterality Date  . CHOLECYSTECTOMY     Family History  Problem Relation Age of Onset  . Sickle cell trait Mother   . Sickle cell trait Father    Social History   Socioeconomic History  . Marital status: Single    Spouse name: Not on file  . Number of children: Not on file  . Years of education: Not on file  . Highest education level: Not on file  Occupational History  . Not on file  Tobacco Use  . Smoking status: Never Smoker  . Smokeless tobacco: Never Used  Substance and Sexual Activity  . Alcohol use: No  . Drug use: Not Currently    Types: Marijuana  . Sexual activity: Not on file  Other Topics Concern  . Not on file  Social History Narrative  . Not on file   Social Determinants of Health   Financial Resource Strain:   . Difficulty of Paying Living Expenses:   Food Insecurity:   . Worried About Charity fundraiser in the Last Year:   . Arboriculturist in the Last Year:   Transportation Needs:   . Film/video editor (Medical):   Marland Kitchen Lack of Transportation (Non-Medical):   Physical Activity:   . Days of Exercise per Week:   . Minutes of Exercise per Session:   Stress:   . Feeling of Stress :   Social  Connections:   . Frequency of Communication with Friends and Family:   . Frequency of Social Gatherings with Friends and Family:   . Attends Religious Services:   . Active Member of Clubs or Organizations:   . Attends Archivist Meetings:   Marland Kitchen Marital Status:   Intimate Partner Violence:   . Fear of Current or Ex-Partner:   . Emotionally Abused:   Marland Kitchen Physically Abused:   . Sexually Abused:   Review of Systems  HENT: Negative.   Eyes: Negative.   Respiratory: Negative.   Cardiovascular: Negative.   Gastrointestinal: Negative.   Genitourinary: Negative.   Musculoskeletal: Positive for back pain and joint pain.  Skin: Negative.   Neurological: Negative.   Psychiatric/Behavioral: Negative.     Physical Exam: There were no vitals taken for this visit. Physical Exam Constitutional:      Appearance: Normal appearance.  HENT:     Nose: Nose normal.     Mouth/Throat:     Mouth: Mucous membranes are moist.     Pharynx: Oropharynx is clear.  Eyes:     Pupils: Pupils are equal, round, and reactive to light.  Cardiovascular:     Rate and Rhythm: Normal rate and regular rhythm.     Pulses: Normal pulses.     Heart sounds: Normal heart sounds.  Abdominal:     General: Abdomen is flat. Bowel sounds are normal.  Musculoskeletal:        General: Normal range of motion.  Skin:    General: Skin is warm.  Neurological:     General: No focal deficit present.     Mental Status: He is alert. Mental status is at baseline.  Psychiatric:        Mood and Affect: Mood normal.        Behavior: Behavior normal.        Thought Content: Thought content normal.        Judgment: Judgment normal.      Lab results: No results found for this or any previous visit (from the past 24 hour(s)).  Imaging results:  No results found.   Assessment & Plan: Patient admitted to sickle cell day infusion center for management of pain crisis.  Patient is opiate tolerant Initiate IV dilaudid  PCA. Settings of 0.5 mg, 10 minute lockout and 3 mg per hour IV fluids, 0.45% saline at 100 ml/hr Toradol 15 mg IV times one dose Tylenol 1000 mg by mouth times one dose Review CBC with differential, complete metabolic panel, and reticulocytes as results become available. Baseline hemoglobin is 8.0-9.0 Pain intensity will be reevaluated in context of functioning and relationship to baseline as care progress If pain intensity remains elevated and/or sudden change in hemodynamic stability transition to inpatient services for higher level of care.   [name]  APRN, MSN, FNP-C [facility] [address] [place], [place] [address] [phone]   05/24/2019, 8:53 AM

## 2019-05-24 NOTE — Telephone Encounter (Signed)
Patient called, complained of pain in the legs and Back   rated at 8/10. Denied chest pain, fever, diarrhea, abdominal pain, nausea/vomitting and priapism. Screened negative for Covid-19 symptoms. Admitted to having means of transportation without driving self after treatment. Last took  500 mg of OTC Tylenol at 05:00 am today. Per provider, patient can come to the day hospital for treatment. Patient notified, verbalized understanding.

## 2019-05-24 NOTE — Discharge Instructions (Signed)
Sickle Cell Anemia, Adult  Sickle cell anemia is a condition where your red blood cells are shaped like sickles. Red blood cells carry oxygen through the body. Sickle-shaped cells do not live as long as normal red blood cells. They also clump together and block blood from flowing through the blood vessels. This prevents the body from getting enough oxygen. Sickle cell anemia causes organ damage and pain. It also increases the risk of infection. Follow these instructions at home: Medicines  Take over-the-counter and prescription medicines only as told by your doctor.  If you were prescribed an antibiotic medicine, take it as told by your doctor. Do not stop taking the antibiotic even if you start to feel better.  If you develop a fever, do not take medicines to lower the fever right away. Tell your doctor about the fever. Managing pain, stiffness, and swelling  Try these methods to help with pain: ? Use a heating pad. ? Take a warm bath. ? Distract yourself, such as by watching TV. Eating and drinking  Drink enough fluid to keep your pee (urine) clear or pale yellow. Drink more in hot weather and during exercise.  Limit or avoid alcohol.  Eat a healthy diet. Eat plenty of fruits, vegetables, whole grains, and lean protein.  Take vitamins and supplements as told by your doctor. Traveling  When traveling, keep these with you: ? Your medical information. ? The names of your doctors. ? Your medicines.  If you need to take an airplane, talk to your doctor first. Activity  Rest often.  Avoid exercises that make your heart beat much faster, such as jogging. General instructions  Do not use products that have nicotine or tobacco, such as cigarettes and e-cigarettes. If you need help quitting, ask your doctor.  Consider wearing a medical alert bracelet.  Avoid being in high places (high altitudes), such as mountains.  Avoid very hot or cold temperatures.  Avoid places where the  temperature changes a lot.  Keep all follow-up visits as told by your doctor. This is important. Contact a doctor if:  A joint hurts.  Your feet or hands hurt or swell.  You feel tired (fatigued). Get help right away if:  You have symptoms of infection. These include: ? Fever. ? Chills. ? Being very tired. ? Irritability. ? Poor eating. ? Throwing up (vomiting).  You feel dizzy or faint.  You have new stomach pain, especially on the left side.  You have a an erection (priapism) that lasts more than 4 hours.  You have numbness in your arms or legs.  You have a hard time moving your arms or legs.  You have trouble talking.  You have pain that does not go away when you take medicine.  You are short of breath.  You are breathing fast.  You have a long-term cough.  You have pain in your chest.  You have a bad headache.  You have a stiff neck.  Your stomach looks bloated even though you did not eat much.  Your skin is pale.  You suddenly cannot see well. Summary  Sickle cell anemia is a condition where your red blood cells are shaped like sickles.  Follow your doctor's advice on ways to manage pain, food to eat, activities to do, and steps to take for safe travel.  Get medical help right away if you have any signs of infection, such as a fever. This information is not intended to replace advice given to you by   your health care provider. Make sure you discuss any questions you have with your health care provider. Document Revised: 05/25/2018 Document Reviewed: 03/08/2016 Elsevier Patient Education  2020 Elsevier Inc.  

## 2019-05-24 NOTE — Discharge Summary (Signed)
[facility] Discharge Summary   Patient ID: Timothy Marks MRN: [id number] DOB/AGE: September 09, 1990 29 y.o.  Admit date: 05/24/2019 Discharge date: 05/24/2019  Primary Care Physician:  [facility]  Admission Diagnoses:  Active Problems:   Sickle cell pain crisis [facility])  Discharge Medications:  Allergies as of 05/24/2019      Reactions   Buprenorphine Hcl Hives   Levaquin [levofloxacin] Itching   Meperidine Rash   Morphine Hives   Morphine And Related Hives   Toradol [ketorolac Tromethamine] Itching   Tramadol Hives   Vancomycin Itching   Zosyn [piperacillin Sod-tazobactam So] Itching, Rash   Has taken rocephin in past      Medication List    TAKE these medications   cholecalciferol 25 MCG (1000 UNIT) tablet Commonly known as: VITAMIN D Take 2 tablets (2,000 Units total) by mouth daily.   Ensure Active High Protein Liqd Take 1 each by mouth 3 (three) times daily between meals. #90 cans of Ensure   folic acid 1 MG tablet Commonly known as: FOLVITE Take 1 tablet (1 mg total) by mouth daily.   gabapentin 300 MG capsule Commonly known as: NEURONTIN Take 1 capsule (300 mg total) by mouth 3 (three) times daily.   hydroxyurea 500 MG capsule Commonly known as: HYDREA Take 1 capsule (500 mg total) by mouth 2 (two) times daily. May take with food to minimize GI side effects.   ibuprofen 800 MG tablet Commonly known as: ADVIL Take 1 tablet (800 mg total) by mouth 3 (three) times daily as needed (pain).   oxyCODONE-acetaminophen 10-325 MG tablet Commonly known as: Percocet Take 1 tablet by mouth every 6 (six) hours as needed for pain.   OxyCONTIN 20 mg 12 hr tablet Generic drug: oxyCODONE Take 1 tablet (20 mg total) by mouth every 12 (twelve) hours.        Consults:  None  Significant Diagnostic Studies:  No results found.  History of present illness:  Timothy Marks, a 29 year old male with a medical history significant for sickle cell  disease, chronic pain syndrome, opiate dependence and tolerance, and history of oppositional defiance disorder presents complaining of low back and lower extremity pain that is consistent with his typical pain crisis.  Patient says that he awakened with increased pain this a.m, which was unrelieved by Tylenol 500 mg.  Patient attributes current crisis to being out of pain medications over the past several weeks.  He says that he is in the process of finding a new provider.  Pain intensity is 8/10 characterized as constant and aching.  He denies fever, chills, chest pain, shortness of breath, urinary symptoms, nausea, vomiting, or diarrhea.  He has had no sick contacts, recent travel, or exposure to COVID-19. [facility] Course: Patient admitted to sickle cell day infusion center for management of pain crisis.  All laboratory values consistent with baseline. Pain managed with IV Dilaudid via PCA.  Settings of 0.5 mg, 10-minute lockout, and 3 mg/h. IV Toradol 15 mg x 1 Tylenol 1000 mg by mouth x1 IV fluids, 0.45% saline at 100 mL/h.  Pain intensity improved throughout admission.  Rates pain as 4/10, consistent with patient's goal.  Patient says that he can manage at home at this time.  He does not warrant admission. He is alert, oriented, and ambulating without assistance.  He will discharge home in a hemodynamically stable condition.  Discharge instructions: Resume all home medications.   Follow up with PCP as previously  scheduled.   Discussed the importance of drinking 64 ounces of water daily, dehydration of red blood cells may lead further sickling.   Avoid all stressors that precipitate sickle cell pain crisis.     The patient was given clear instructions to go to ER or return to medical center if symptoms do not improve, worsen or new problems develop.     Physical Exam at Discharge:  BP 126/66 (BP Location: Left Arm)   Pulse 87   Temp 98.1 F (36.7 C) (Temporal)    Resp 12   SpO2 97%  Physical Exam Constitutional:      Appearance: He is normal weight.  HENT:     Mouth/Throat:     Mouth: Mucous membranes are moist.  Eyes:     Pupils: Pupils are equal, round, and reactive to light.  Cardiovascular:     Rate and Rhythm: Normal rate and regular rhythm.  Pulmonary:     Effort: Pulmonary effort is normal.  Abdominal:     General: Abdomen is flat. Bowel sounds are normal.  Skin:    General: Skin is warm.  Neurological:     General: No focal deficit present.     Mental Status: He is alert. Mental status is at baseline.  Psychiatric:        Mood and Affect: Mood normal.        Behavior: Behavior normal.        Thought Content: Thought content normal.        Judgment: Judgment normal.       Disposition at Discharge: Discharge disposition: 01-Home or Self Care       Discharge Orders:   Condition at Discharge:   Stable  Time spent on Discharge:  Greater than 30 minutes.  Signed: [name]  APRN, MSN, FNP-C [facility] [address] [place], [place] [address] [phone]  05/24/2019, 4:56 PM

## 2019-05-24 NOTE — Progress Notes (Signed)
Patient admitted to the day infusion hospital for sickle cell pain. Initially, patient reported bilateral leg and back pain rated 8/10. For pain management, patient placed on Dilaudid PCA, given 15 mg Toradol, 1000 mg Tylenol and hydrated with IV fluids. At discharge, patient rated pain at 4/10. Vital signs stable. Discharge instructions given. Patient alert, oriented and ambulatory at discharge.

## 2019-06-12 ENCOUNTER — Telehealth (HOSPITAL_COMMUNITY): Payer: Self-pay | Admitting: General Practice

## 2019-06-12 ENCOUNTER — Non-Acute Institutional Stay (HOSPITAL_COMMUNITY)
Admission: EM | Admit: 2019-06-12 | Discharge: 2019-06-12 | Disposition: A | Discharge status: Home / Self Care | Payer: Medicaid Other | Source: Ambulatory Visit | Attending: Internal Medicine | Admitting: Internal Medicine

## 2019-06-12 DIAGNOSIS — G894 Chronic pain syndrome: Secondary | ICD-10-CM | POA: Insufficient documentation

## 2019-06-12 DIAGNOSIS — E559 Vitamin D deficiency, unspecified: Secondary | ICD-10-CM | POA: Diagnosis not present

## 2019-06-12 DIAGNOSIS — Z885 Allergy status to narcotic agent status: Secondary | ICD-10-CM | POA: Insufficient documentation

## 2019-06-12 DIAGNOSIS — F1120 Opioid dependence, uncomplicated: Secondary | ICD-10-CM | POA: Insufficient documentation

## 2019-06-12 DIAGNOSIS — D5700 Hb-SS disease with crisis, unspecified: Secondary | ICD-10-CM | POA: Diagnosis present

## 2019-06-12 DIAGNOSIS — D571 Sickle-cell disease without crisis: Secondary | ICD-10-CM | POA: Diagnosis present

## 2019-06-12 DIAGNOSIS — Z880 Allergy status to penicillin: Secondary | ICD-10-CM | POA: Diagnosis not present

## 2019-06-12 DIAGNOSIS — Z79899 Other long term (current) drug therapy: Secondary | ICD-10-CM | POA: Diagnosis not present

## 2019-06-12 DIAGNOSIS — Z888 Allergy status to other drugs, medicaments and biological substances status: Secondary | ICD-10-CM | POA: Insufficient documentation

## 2019-06-12 DIAGNOSIS — Z881 Allergy status to other antibiotic agents status: Secondary | ICD-10-CM | POA: Insufficient documentation

## 2019-06-12 LAB — CBC WITH DIFFERENTIAL/PLATELET
Abs Immature Granulocytes: 0.02 10*3/uL (ref 0.00–0.07)
Basophils Absolute: 0 10*3/uL (ref 0.0–0.1)
Basophils Relative: 0 %
Eosinophils Absolute: 0.2 10*3/uL (ref 0.0–0.5)
Eosinophils Relative: 3 %
HCT: 31.3 % — ABNORMAL LOW (ref 39.0–52.0)
Hemoglobin: 10.5 g/dL — ABNORMAL LOW (ref 13.0–17.0)
Immature Granulocytes: 0 %
Lymphocytes Relative: 28 %
Lymphs Abs: 2 10*3/uL (ref 0.7–4.0)
MCH: 28.9 pg (ref 26.0–34.0)
MCHC: 33.5 g/dL (ref 30.0–36.0)
MCV: 86.2 fL (ref 80.0–100.0)
Monocytes Absolute: 0.5 10*3/uL (ref 0.1–1.0)
Monocytes Relative: 7 %
Neutro Abs: 4.2 10*3/uL (ref 1.7–7.7)
Neutrophils Relative %: 62 %
Platelets: 622 10*3/uL — ABNORMAL HIGH (ref 150–400)
RBC: 3.63 MIL/uL — ABNORMAL LOW (ref 4.22–5.81)
RDW: 21.9 % — ABNORMAL HIGH (ref 11.5–15.5)
WBC: 6.9 10*3/uL (ref 4.0–10.5)
nRBC: 6.7 % — ABNORMAL HIGH (ref 0.0–0.2)

## 2019-06-12 LAB — COMPREHENSIVE METABOLIC PANEL
ALT: 10 U/L (ref 0–44)
AST: 18 U/L (ref 15–41)
Albumin: 4.2 g/dL (ref 3.5–5.0)
Alkaline Phosphatase: 83 U/L (ref 38–126)
Anion gap: 8 (ref 5–15)
BUN: <5 mg/dL — ABNORMAL LOW (ref 6–20)
CO2: 26 mmol/L (ref 22–32)
Calcium: 9.2 mg/dL (ref 8.9–10.3)
Chloride: 105 mmol/L (ref 98–111)
Creatinine, Ser: 0.57 mg/dL — ABNORMAL LOW (ref 0.61–1.24)
GFR calc Af Amer: >60 mL/min (ref >60)
GFR calc non Af Amer: >60 mL/min (ref >60)
Glucose, Bld: 90 mg/dL (ref 70–99)
Potassium: 3.8 mmol/L (ref 3.5–5.1)
Sodium: 139 mmol/L (ref 135–145)
Total Bilirubin: 1.3 mg/dL — ABNORMAL HIGH (ref 0.3–1.2)
Total Protein: 7.8 g/dL (ref 6.5–8.1)

## 2019-06-12 LAB — RETICULOCYTES
Immature Retic Fract: 43.9 % — ABNORMAL HIGH (ref 2.3–15.9)
RBC.: 3.61 MIL/uL — ABNORMAL LOW (ref 4.22–5.81)
Retic Count, Absolute: 355.6 10*3/uL — ABNORMAL HIGH (ref 19.0–186.0)
Retic Ct Pct: 9.9 % — ABNORMAL HIGH (ref 0.4–3.1)

## 2019-06-12 MED ORDER — ONDANSETRON HCL 4 MG/2ML IJ SOLN
4.0000 mg | Freq: Four times a day (QID) | INTRAMUSCULAR | Status: DC | PRN
  Administered 2019-06-12: 12:00:00 4 mg via INTRAVENOUS
  Filled 2019-06-12: qty 2

## 2019-06-12 MED ORDER — SODIUM CHLORIDE 0.9% FLUSH: 9.0000 mL | INTRAVENOUS | Status: DC | PRN

## 2019-06-12 MED ORDER — KETOROLAC TROMETHAMINE 30 MG/ML IJ SOLN
15.0000 mg | Freq: Once | INTRAMUSCULAR | Status: AC
  Administered 2019-06-12: 11:00:00 15 mg via INTRAVENOUS
  Filled 2019-06-12: qty 1

## 2019-06-12 MED ORDER — DIPHENHYDRAMINE HCL 25 MG PO CAPS
25.0000 mg | ORAL_CAPSULE | Freq: Once | ORAL | Status: AC
  Administered 2019-06-12: 25 mg via ORAL
  Filled 2019-06-12: qty 1

## 2019-06-12 MED ORDER — DEXTROSE-NACL 5-0.45 % IV SOLN: INTRAVENOUS | Status: DC

## 2019-06-12 MED ORDER — ACETAMINOPHEN 500 MG PO TABS
1000.0000 mg | ORAL_TABLET | Freq: Once | ORAL | Status: AC
  Administered 2019-06-12: 09:00:00 1000 mg via ORAL
  Filled 2019-06-12: qty 2

## 2019-06-12 MED ORDER — HYDROMORPHONE 1 MG/ML IV SOLN
INTRAVENOUS | Status: DC
  Administered 2019-06-12: 10 mg via INTRAVENOUS
  Administered 2019-06-12: 30 mg via INTRAVENOUS
  Filled 2019-06-12: qty 30

## 2019-06-12 MED ORDER — NALOXONE HCL 0.4 MG/ML IJ SOLN: 0.4000 mg | INTRAMUSCULAR | Status: DC | PRN

## 2019-06-12 NOTE — H&P (Signed)
[facility] History and Physical   Date: 06/12/2019  Patient name: Timothy Marks Medical record number: [id number] Date of birth: [birth year] Age: 29 y.o. Gender: male PCP: [facility] [place]  Attending physician: Tresa Garter, MD  Chief Complaint: Sickle cell pain  History of Present Illness: Timothy Marks a 29 year old male with a medical history significant for sickle cell disease, chronic pain syndrome, opiate dependence and tolerance, and history of oppositional defiance disorder presents complaining of low back and lower extremity pain that is consistent with his typical pain crisis.  Patient says that he has not had home medications in greater than 1 week.  He says that he was dismissed from pain medication center.  He attributes pain crisis to being out of medications.  He last had Tylenol this a.m. without sustained relief.  Pain intensity is 9/10 characterized as constant and throbbing.  Patient denies fever, chills, headache, shortness of breath, urinary symptoms, nausea, vomiting, or diarrhea.  He denies any sick contacts, recent travel, or exposure to COVID-19.   Meds: Medications Prior to Admission  Medication Sig Dispense Refill Last Dose  . cholecalciferol (VITAMIN D) 1000 units tablet Take 2 tablets (2,000 Units total) by mouth daily. 30 tablet 6 06/12/2019 at Unknown time  . folic acid (FOLVITE) 1 MG tablet Take 1 tablet (1 mg total) by mouth daily. 90 tablet 2 06/12/2019 at Unknown time  . gabapentin (NEURONTIN) 300 MG capsule Take 1 capsule (300 mg total) by mouth 3 (three) times daily. 90 capsule 5 06/12/2019 at Unknown time  . hydroxyurea (HYDREA) 500 MG capsule Take 1 capsule (500 mg total) by mouth 2 (two) times daily. May take with food to minimize GI side effects. 180 capsule 2 06/12/2019 at Unknown time  . ibuprofen (ADVIL) 800 MG tablet Take 1 tablet (800 mg total) by mouth 3 (three) times daily as needed (pain). 30 tablet 0 06/12/2019 at  Unknown time  . oxyCODONE-acetaminophen (PERCOCET) 10-325 MG tablet Take 1 tablet by mouth every 6 (six) hours as needed for pain. 20 tablet 0 Past Week at Unknown time  . OXYCONTIN 20 MG 12 hr tablet Take 1 tablet (20 mg total) by mouth every 12 (twelve) hours. 10 tablet 0 Past Week at Unknown time  . Nutritional Supplements (ENSURE ACTIVE HIGH PROTEIN) LIQD Take 1 each by mouth 3 (three) times daily between meals. #90 cans of Ensure 237 mL 11     Allergies: Buprenorphine hcl, Levaquin [levofloxacin], Meperidine, Morphine, Morphine and related, Toradol [ketorolac tromethamine], Tramadol, Vancomycin, and Zosyn [piperacillin sod-tazobactam so] Past Medical History:  Diagnosis Date  . Depression   . Generalized weakness 03/31/2019  . 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 Sickle cell anemia (HCC)   . Vitamin D deficiency y-6   Past Surgical History:  Procedure Laterality Date  . CHOLECYSTECTOMY     Family History  Problem Relation Age of Onset  . Sickle cell trait Mother   . Sickle cell trait Father    Social History   Socioeconomic History  . Marital status: Single    Spouse name: Not on file  . Number of children: Not on file  . Years of education: Not on file  . Highest education level: Not on file  Occupational History  . Not on file  Tobacco Use  . Smoking status: Never Smoker  . Smokeless tobacco: Never Used  Substance and Sexual Activity  . Alcohol  use: No  . Drug use: Not Currently    Types: Marijuana  . Sexual activity: Not on file  Other Topics Concern  . Not on file  Social History Narrative  . Not on file   Social Determinants of Health   Financial Resource Strain:   . Difficulty of Paying Living Expenses:   Food Insecurity:   . Worried About Charity fundraiser in the Last Year:   . Arboriculturist in the Last Year:   Transportation Needs:   . Film/video editor (Medical):   Marland Kitchen Lack of  Transportation (Non-Medical):   Physical Activity:   . Days of Exercise per Week:   . Minutes of Exercise per Session:   Stress:   . Feeling of Stress :   Social Connections:   . Frequency of Communication with Friends and Family:   . Frequency of Social Gatherings with Friends and Family:   . Attends Religious Services:   . Active Member of Clubs or Organizations:   . Attends Archivist Meetings:   Marland Kitchen Marital Status:   Intimate Partner Violence:   . Fear of Current or Ex-Partner:   . Emotionally Abused:   Marland Kitchen Physically Abused:   . Sexually Abused:    Review of Systems  Constitutional: Negative for chills and fever.  Eyes: Negative for blurred vision and double vision.  Respiratory: Negative.   Cardiovascular: Negative.   Gastrointestinal: Negative.   Genitourinary: Negative.   Musculoskeletal: Positive for back pain.  Neurological: Negative.   Endo/Heme/Allergies: Negative.    Physical Exam: Blood pressure (!) 106/42, pulse 75, temperature 98.2 F (36.8 C), temperature source Temporal, resp. rate 12, SpO2 100 %.  Physical Exam HENT:     Mouth/Throat:     Mouth: Mucous membranes are moist.  Eyes:     Pupils: Pupils are equal, round, and reactive to light.  Cardiovascular:     Rate and Rhythm: Normal rate and regular rhythm.     Pulses: Normal pulses.  Pulmonary:     Effort: Pulmonary effort is normal.  Abdominal:     General: Abdomen is flat. Bowel sounds are normal.  Musculoskeletal:        General: Normal range of motion.  Skin:    General: Skin is warm.  Neurological:     General: No focal deficit present.     Mental Status: He is alert. Mental status is at baseline.  Psychiatric:        Mood and Affect: Mood normal.        Behavior: Behavior normal.        Thought Content: Thought content normal.        Judgment: Judgment normal.    Lab results: Results for orders placed or performed during the hospital encounter of 06/12/19 (from the past 24  hour(s))  CBC with Differential/Platelet     Status: Abnormal   Collection Time: 06/12/19 10:55 AM  Result Value Ref Range   WBC 6.9 4.0 - 10.5 K/uL   RBC 3.63 (L) 4.22 - 5.81 MIL/uL   Hemoglobin 10.5 (L) 13.0 - 17.0 g/dL   HCT 31.3 (L) 39.0 - 52.0 %   MCV 86.2 80.0 - 100.0 fL   MCH 28.9 26.0 - 34.0 pg   MCHC 33.5 30.0 - 36.0 g/dL   RDW 21.9 (H) 11.5 - 15.5 %   Platelets 622 (H) 150 - 400 K/uL   nRBC 6.7 (H) 0.0 - 0.2 %   Neutrophils Relative %  62 %   Neutro Abs 4.2 1.7 - 7.7 K/uL   Lymphocytes Relative 28 %   Lymphs Abs 2.0 0.7 - 4.0 K/uL   Monocytes Relative 7 %   Monocytes Absolute 0.5 0.1 - 1.0 K/uL   Eosinophils Relative 3 %   Eosinophils Absolute 0.2 0.0 - 0.5 K/uL   Basophils Relative 0 %   Basophils Absolute 0.0 0.0 - 0.1 K/uL   Immature Granulocytes 0 %   Abs Immature Granulocytes 0.02 0.00 - 0.07 K/uL   [name] Bodies PRESENT    Polychromasia PRESENT    Sickle Cells PRESENT    Target Cells PRESENT   Comprehensive metabolic panel     Status: Abnormal   Collection Time: 06/12/19 10:55 AM  Result Value Ref Range   Sodium 139 135 - 145 mmol/L   Potassium 3.8 3.5 - 5.1 mmol/L   Chloride 105 98 - 111 mmol/L   CO2 26 22 - 32 mmol/L   Glucose, Bld 90 70 - 99 mg/dL   BUN <5 (L) 6 - 20 mg/dL   Creatinine, Ser 0.57 (L) 0.61 - 1.24 mg/dL   Calcium 9.2 8.9 - 10.3 mg/dL   Total Protein 7.8 6.5 - 8.1 g/dL   Albumin 4.2 3.5 - 5.0 g/dL   AST 18 15 - 41 U/L   ALT 10 0 - 44 U/L   Alkaline Phosphatase 83 38 - 126 U/L   Total Bilirubin 1.3 (H) 0.3 - 1.2 mg/dL   GFR calc non Af Amer >60 >60 mL/min   GFR calc Af Amer >60 >60 mL/min   Anion gap 8 5 - 15  Reticulocytes     Status: Abnormal   Collection Time: 06/12/19 10:55 AM  Result Value Ref Range   Retic Ct Pct 9.9 (H) 0.4 - 3.1 %   RBC. 3.61 (L) 4.22 - 5.81 MIL/uL   Retic Count, Absolute 355.6 (H) 19.0 - 186.0 K/uL   Immature Retic Fract 43.9 (H) 2.3 - 15.9 %    Imaging results:  No results found.   Assessment  & Plan: Patient admitted to sickle cell day infusion center for management of pain crisis.  Patient is opiate tolerant Initiate IV dilaudid PCA. Settings of 0.5, 10 minute lock out, and 3 mg/hr IV fluids, 0.45% saline at 100 ml/hr Toradol 15 mg IV times one dose Tylenol 1000 mg by mouth times one dose Review CBC with differential, complete metabolic panel, and reticulocytes as results become available. Baseline hemoglobin is Pain intensity will be reevaluated in context of functioning and relationship to baseline as care progress If pain intensity remains elevated and/or sudden change in hemodynamic stability transition to inpatient services for higher level of care.       [name]  APRN, MSN, FNP-C Patient [facility] [address] [place], [place] [address] [phone]  06/12/2019, 1:42 PM

## 2019-06-12 NOTE — Discharge Summary (Signed)
[facility] Discharge Summary   Patient ID: Timothy Marks MRN: [id number] DOB/AGE: 08-02-1990 29 y.o.  Admit date: 06/12/2019 Discharge date: 06/12/2019  Primary Care Physician:  [facility] [place]  Admission Diagnoses:  Active Problems:   Sickle cell pain crisis [facility])   Discharge Medications:  Allergies as of 06/12/2019      Reactions   Buprenorphine Hcl Hives   Levaquin [levofloxacin] Itching   Meperidine Rash   Morphine Hives   Morphine And Related Hives   Toradol [ketorolac Tromethamine] Itching   Tramadol Hives   Vancomycin Itching   Zosyn [piperacillin Sod-tazobactam So] Itching, Rash   Has taken rocephin in past      Medication List    TAKE these medications   cholecalciferol 25 MCG (1000 UNIT) tablet Commonly known as: VITAMIN D Take 2 tablets (2,000 Units total) by mouth daily.   Ensure Active High Protein Liqd Take 1 each by mouth 3 (three) times daily between meals. #90 cans of Ensure   folic acid 1 MG tablet Commonly known as: FOLVITE Take 1 tablet (1 mg total) by mouth daily.   gabapentin 300 MG capsule Commonly known as: NEURONTIN Take 1 capsule (300 mg total) by mouth 3 (three) times daily.   hydroxyurea 500 MG capsule Commonly known as: HYDREA Take 1 capsule (500 mg total) by mouth 2 (two) times daily. May take with food to minimize GI side effects.   ibuprofen 800 MG tablet Commonly known as: ADVIL Take 1 tablet (800 mg total) by mouth 3 (three) times daily as needed (pain).   oxyCODONE-acetaminophen 10-325 MG tablet Commonly known as: Percocet Take 1 tablet by mouth every 6 (six) hours as needed for pain.   OxyCONTIN 20 mg 12 hr tablet Generic drug: oxyCODONE Take 1 tablet (20 mg total) by mouth every 12 (twelve) hours.        Consults:  None  Significant Diagnostic Studies:  No results found.  History of present illness:  Timothy Marks a 29 year old male with a medical history significant for sickle cell  disease, chronic pain syndrome, opiate dependence and tolerance, and history of oppositional defiance disorder presents complaining of low back and lower extremity pain that is consistent with his typical pain crisis.  Patient says that he has not had home medications in greater than 1 week.  He says that he was dismissed from pain medication center.  He attributes pain crisis to being out of medications.  He last had Tylenol this a.m. without sustained relief.  Pain intensity is 9/10 characterized as constant and throbbing.  Patient denies fever, chills, headache, shortness of breath, urinary symptoms, nausea, vomiting, or diarrhea.  He denies any sick contacts, recent travel, or exposure to COVID-19. [facility] Course: Patient admitted to sickle cell day infusion center for management of pain crisis. All laboratory values reviewed, consistent with patient's baseline. Pain managed with IV Dilaudid via PCA with settings of 0.5 mg, 10-minute lockout, and 3 mg/h. Toradol 15 mg IV x1 Tylenol 1000 mg by mouth x1 IV fluids, D5 0.45% saline at 100 mL/h Pain intensity decreased to 5/10, which is improved.  He does not warrant admission at this time.  He is alert, oriented, and ambulating without assistance. Patient will discharge home in a hemodynamically stable condition.  Discharge instructions:  Resume all home medications.   Follow up with PCP as previously  scheduled.   Discussed the importance of drinking 64 ounces of water daily, dehydration of red blood  cells may lead further sickling.   Avoid all stressors that precipitate sickle cell pain crisis.     The patient was given clear instructions to go to ER or return to medical center if symptoms do not improve, worsen or new problems develop.       Physical Exam at Discharge:  BP (!) 106/42 (BP Location: Left Arm)   Pulse 75   Temp 98.2 F (36.8 C) (Temporal)   Resp 12   SpO2 100%   Physical Exam Constitutional:       Appearance: Normal appearance.  HENT:     Head: Normocephalic.     Mouth/Throat:     Mouth: Mucous membranes are moist.  Eyes:     Pupils: Pupils are equal, round, and reactive to light.  Cardiovascular:     Rate and Rhythm: Normal rate and regular rhythm.     Pulses: Normal pulses.  Pulmonary:     Effort: Pulmonary effort is normal.  Abdominal:     General: Abdomen is flat. Bowel sounds are normal.  Musculoskeletal:        General: Normal range of motion.  Skin:    General: Skin is warm.  Neurological:     General: No focal deficit present.     Mental Status: He is alert. Mental status is at baseline.  Psychiatric:        Mood and Affect: Mood normal.        Thought Content: Thought content normal.        Judgment: Judgment normal.      Disposition at Discharge: Discharge disposition: 01-Home or Self Care       Discharge Orders: Discharge Instructions    Discharge patient   Complete by: As directed    Discharge disposition: 01-Home or Self Care   Discharge patient date: 06/12/2019      Condition at Discharge:   Stable  Time spent on Discharge:  Greater than 30 minutes.  Signed:  [name]  APRN, MSN, FNP-C Patient [facility] [address] [place], [place] [address] [phone]  06/12/2019, 3:17 PM

## 2019-06-12 NOTE — Progress Notes (Signed)
Patient admitted to the day hospital for treatment of sickle cell pain crisis. Patient reported back pain rated 9/10. Patient placed on Dilaudid PCA, given PO Tylenol, PO benadryl, IV Toradol, IV Zofran and hydrated with IV fluids. At discharge patient reported  pain at 5/10. Discharge instructions given to patient. Patient alert, oriented and ambulatory at discharge.

## 2019-06-12 NOTE — Telephone Encounter (Signed)
Patient called, requesting to come to the day hospital due to pain in the back rated at 9/10. Denied chest pain, fever, diarrhea, abdominal pain, nausea/vomitting and priapism. Screened negative for Covid-19 symptoms. Admitted to having means of transportation without driving self after treatment. Last took 2 OTC Tylenol at 22:00 yesterday. Per provider, patient can come to the day hospital for treatment. Patient notified, verbalized understanding.

## 2019-06-12 NOTE — Discharge Instructions (Signed)
Sickle Cell Anemia, Adult  Sickle cell anemia is a condition where your red blood cells are shaped like sickles. Red blood cells carry oxygen through the body. Sickle-shaped cells do not live as long as normal red blood cells. They also clump together and block blood from flowing through the blood vessels. This prevents the body from getting enough oxygen. Sickle cell anemia causes organ damage and pain. It also increases the risk of infection. Follow these instructions at home: Medicines  Take over-the-counter and prescription medicines only as told by your doctor.  If you were prescribed an antibiotic medicine, take it as told by your doctor. Do not stop taking the antibiotic even if you start to feel better.  If you develop a fever, do not take medicines to lower the fever right away. Tell your doctor about the fever. Managing pain, stiffness, and swelling  Try these methods to help with pain: ? Use a heating pad. ? Take a warm bath. ? Distract yourself, such as by watching TV. Eating and drinking  Drink enough fluid to keep your pee (urine) clear or pale yellow. Drink more in hot weather and during exercise.  Limit or avoid alcohol.  Eat a healthy diet. Eat plenty of fruits, vegetables, whole grains, and lean protein.  Take vitamins and supplements as told by your doctor. Traveling  When traveling, keep these with you: ? Your medical information. ? The names of your doctors. ? Your medicines.  If you need to take an airplane, talk to your doctor first. Activity  Rest often.  Avoid exercises that make your heart beat much faster, such as jogging. General instructions  Do not use products that have nicotine or tobacco, such as cigarettes and e-cigarettes. If you need help quitting, ask your doctor.  Consider wearing a medical alert bracelet.  Avoid being in high places (high altitudes), such as mountains.  Avoid very hot or cold temperatures.  Avoid places where the  temperature changes a lot.  Keep all follow-up visits as told by your doctor. This is important. Contact a doctor if:  A joint hurts.  Your feet or hands hurt or swell.  You feel tired (fatigued). Get help right away if:  You have symptoms of infection. These include: ? Fever. ? Chills. ? Being very tired. ? Irritability. ? Poor eating. ? Throwing up (vomiting).  You feel dizzy or faint.  You have new stomach pain, especially on the left side.  You have a an erection (priapism) that lasts more than 4 hours.  You have numbness in your arms or legs.  You have a hard time moving your arms or legs.  You have trouble talking.  You have pain that does not go away when you take medicine.  You are short of breath.  You are breathing fast.  You have a long-term cough.  You have pain in your chest.  You have a bad headache.  You have a stiff neck.  Your stomach looks bloated even though you did not eat much.  Your skin is pale.  You suddenly cannot see well. Summary  Sickle cell anemia is a condition where your red blood cells are shaped like sickles.  Follow your doctor's advice on ways to manage pain, food to eat, activities to do, and steps to take for safe travel.  Get medical help right away if you have any signs of infection, such as a fever. This information is not intended to replace advice given to you by   your health care provider. Make sure you discuss any questions you have with your health care provider. Document Revised: 05/25/2018 Document Reviewed: 03/08/2016 Elsevier Patient Education  2020 Elsevier Inc.  

## 2019-06-14 ENCOUNTER — Encounter: Payer: Self-pay | Admitting: Nurse Practitioner

## 2019-06-14 ENCOUNTER — Telehealth: Payer: Self-pay | Admitting: Nurse Practitioner

## 2019-06-14 ENCOUNTER — Other Ambulatory Visit: Payer: Self-pay | Admitting: Nurse Practitioner

## 2019-06-14 ENCOUNTER — Ambulatory Visit (INDEPENDENT_AMBULATORY_CARE_PROVIDER_SITE_OTHER): Payer: Medicaid Other | Admitting: Nurse Practitioner

## 2019-06-14 ENCOUNTER — Other Ambulatory Visit: Payer: Self-pay

## 2019-06-14 VITALS — BP 122/73 | HR 74 | Ht 74.0 in | Wt 121.0 lb

## 2019-06-14 DIAGNOSIS — G894 Chronic pain syndrome: Secondary | ICD-10-CM

## 2019-06-14 DIAGNOSIS — R636 Underweight: Secondary | ICD-10-CM | POA: Diagnosis not present

## 2019-06-14 DIAGNOSIS — D571 Sickle-cell disease without crisis: Secondary | ICD-10-CM | POA: Diagnosis not present

## 2019-06-14 DIAGNOSIS — F332 Major depressive disorder, recurrent severe without psychotic features: Secondary | ICD-10-CM

## 2019-06-14 LAB — POCT URINALYSIS DIP (CLINITEK)
Bilirubin, UA: NEGATIVE
Blood, UA: NEGATIVE
Glucose, UA: NEGATIVE mg/dL
Ketones, POC UA: NEGATIVE mg/dL
Leukocytes, UA: NEGATIVE
Nitrite, UA: NEGATIVE
POC PROTEIN,UA: NEGATIVE
Spec Grav, UA: 1.025 (ref 1.010–1.025)
Urobilinogen, UA: 4 E.U./dL — AB
pH, UA: 7.5 (ref 5.0–8.0)

## 2019-06-14 MED ORDER — OXYCODONE-ACETAMINOPHEN 10-325 MG PO TABS: 1.0000 | ORAL_TABLET | Freq: Four times a day (QID) | ORAL | 0 refills | Status: DC | PRN

## 2019-06-14 MED ORDER — OXYCONTIN 20 MG PO T12A: 20.0000 mg | EXTENDED_RELEASE_TABLET | Freq: Two times a day (BID) | ORAL | 0 refills | Status: DC

## 2019-06-14 MED FILL — OXYCODONE-APAP 10-325: 10-325 | 8 days supply | Qty: 30 | Fill #0

## 2019-06-14 NOTE — Progress Notes (Signed)
[place], [place]  [address] Phone:  [phone]   Fax:  [phone]   Established Patient Office Visit  Subjective:  Patient ID: Timothy Marks, male    DOB: [birth year]  Age: 29 y.o. MRN: [id number]  CC:  Chief Complaint  Patient presents with  . Back Pain    back pain started having pain yesterday    HPI Timothy Marks presents for follow up. 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   He currently complains of 8 out of 10 lower back pain.  He describes it as midline that radiates across the lower back.  He denies any lower extremity involvement.  He denies any numbness tingling or weakness.  He denies any recent accidents or injuries.  He admits that his bowel movements are regular.  He denies constipation.  He admits that he has a regular diet.  Denies fever, headache, cough, wheezing, shortness of breath, chest pains, abdominal pain, hip pain, or leg pain. Denies any open wounds, skin irritation.  He is not very active but is looking for part-time work. Last eye exam was 2020.  Depression Patient has history of depression. He denies depressed mood, difficulty concentrating, feelings of worthlessness/guilt, hopelessness, impaired memory, insomnia, psychomotor agitation, recurrent thoughts of death, suicidal attempt, suicidal thoughts with specific plan and suicidal thoughts without plan. Family history significant for no psychiatric illness. Possible organic causes contributing are: Chronic disease. Risk factors: previous episode of depression. Previous treatment includes individual therapy.   Past Medical History:  Diagnosis Date  . Depression   . Generalized weakness 03/31/2019  . 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 Sickle cell anemia (HCC)   . Vitamin D deficiency y-6    Past Surgical History:  Procedure Laterality Date  . CHOLECYSTECTOMY      Family History  Problem Relation Age of Onset  . Sickle cell trait Mother   . Sickle cell trait Father     Social History   Socioeconomic History  . Marital status: Single    Spouse name: Not on file  . Number of children: Not on file  . Years of education: Not on file  . Highest education level: Not on file  Occupational History  . Not on file  Tobacco Use  . Smoking status: Never Smoker  . Smokeless tobacco: Never Used  Substance and Sexual Activity  . Alcohol use: No  . Drug use: Not Currently    Types: Marijuana  . Sexual activity: Not on file  Other Topics Concern  . Not on file  Social History Narrative  . Not on file   Social Determinants of Health   Financial Resource Strain:   . Difficulty of Paying Living Expenses:   Food Insecurity:   . Worried About Charity fundraiser in the Last Year:   . Arboriculturist in the Last Year:   Transportation Needs:   . Film/video editor (Medical):   Marland Kitchen Lack of Transportation (Non-Medical):   Physical Activity:   . Days of Exercise per Week:   . Minutes of Exercise per Session:   Stress:   . Feeling of Stress :   Social Connections:   . Frequency of Communication with Friends and Family:   . Frequency  of Social Gatherings with Friends and Family:   . Attends Religious Services:   . Active Member of Clubs or Organizations:   . Attends Archivist Meetings:   Marland Kitchen Marital Status:   Intimate Partner Violence:   . Fear of Current or Ex-Partner:   . Emotionally Abused:   Marland Kitchen Physically Abused:   . Sexually Abused:     Outpatient Medications Prior to Visit  Medication Sig Dispense Refill  . cholecalciferol (VITAMIN D) 1000 units tablet Take 2 tablets (2,000 Units total) by mouth daily. 30 tablet 6  . folic acid (FOLVITE) 1 MG tablet Take 1 tablet (1 mg total)  by mouth daily. 90 tablet 2  . gabapentin (NEURONTIN) 300 MG capsule Take 1 capsule (300 mg total) by mouth 3 (three) times daily. 90 capsule 5  . hydroxyurea (HYDREA) 500 MG capsule Take 1 capsule (500 mg total) by mouth 2 (two) times daily. May take with food to minimize GI side effects. 180 capsule 2  . ibuprofen (ADVIL) 800 MG tablet Take 1 tablet (800 mg total) by mouth 3 (three) times daily as needed (pain). 30 tablet 0  . Nutritional Supplements (ENSURE ACTIVE HIGH PROTEIN) LIQD Take 1 each by mouth 3 (three) times daily between meals. #90 cans of Ensure 237 mL 11  . oxyCODONE-acetaminophen (PERCOCET) 10-325 MG tablet Take 1 tablet by mouth every 6 (six) hours as needed for pain. 20 tablet 0  . OXYCONTIN 20 MG 12 hr tablet Take 1 tablet (20 mg total) by mouth every 12 (twelve) hours. 10 tablet 0   No facility-administered medications prior to visit.    Allergies  Allergen Reactions  . Buprenorphine Hcl Hives  . Levaquin [Levofloxacin] Itching  . Meperidine Rash  . Morphine Hives  . Morphine And Related Hives  . Toradol [Ketorolac Tromethamine] Itching  . Tramadol Hives  . Vancomycin Itching  . Zosyn [Piperacillin Sod-Tazobactam So] Itching and Rash    Has taken rocephin in past    ROS Review of Systems  All other systems reviewed and are negative.     Objective:    Physical Exam  Constitutional: He is oriented to person, place, and time. He appears well-developed.  Underweight  HENT:  Head: Normocephalic and atraumatic.  Eyes: Pupils are equal, round, and reactive to light.  Cardiovascular: Normal rate, regular rhythm, normal heart sounds and intact distal pulses.  Pulmonary/Chest: Effort normal and breath sounds normal.  Abdominal: Soft. Bowel sounds are normal.  Musculoskeletal:        General: Tenderness present. Normal range of motion.     Cervical back: Normal range of motion.     Comments: Lower back   Neurological: He is alert and oriented to person,  place, and time.  Skin: Skin is warm and dry.  Psychiatric: He has a normal mood and affect. His behavior is normal. Judgment and thought content normal.    BP 122/73 (BP Location: Left Arm, Patient Position: Sitting)   Pulse 74   Ht 6\' 2"  (1.88 m)   Wt 121 lb (54.9 kg)   SpO2 100%   BMI 15.54 kg/m  Wt Readings from Last 3 Encounters:  06/14/19 121 lb (54.9 kg)  03/31/19 120 lb (54.4 kg)  03/23/19 119 lb 0.8 oz (54 kg)     Health Maintenance Due  Topic Date Due  . COVID-19 Vaccine (1) Never done    There are no preventive care reminders to display for this patient.  Lab Results  Component Value  Date   TSH 0.884 08/31/2018   Lab Results  Component Value Date   WBC 6.9 06/12/2019   HGB 10.5 (L) 06/12/2019   HCT 31.3 (L) 06/12/2019   MCV 86.2 06/12/2019   PLT 622 (H) 06/12/2019   Lab Results  Component Value Date   NA 139 06/12/2019   K 3.8 06/12/2019   CO2 26 06/12/2019   GLUCOSE 90 06/12/2019   BUN <5 (L) 06/12/2019   CREATININE 0.57 (L) 06/12/2019   BILITOT 1.3 (H) 06/12/2019   ALKPHOS 83 06/12/2019   AST 18 06/12/2019   ALT 10 06/12/2019   PROT 7.8 06/12/2019   ALBUMIN 4.2 06/12/2019   CALCIUM 9.2 06/12/2019   ANIONGAP 8 06/12/2019   No results found for: CHOL No results found for: HDL No results found for: LDLCALC No results found for: TRIG No results found for: CHOLHDL No results found for: HGBA1C    Assessment & Plan:   Problem List Items Addressed This Visit      Unprioritized   Chronic pain syndrome - Primary   Relevant Medications   oxyCODONE-acetaminophen (PERCOCET) 10-325 MG tablet   OXYCONTIN 20 MG 12 hr tablet   Major depressive disorder, recurrent, severe without psychotic features (HCC)   Sickle cell anemia (HCC) (Chronic)   Relevant Orders   POCT URINALYSIS DIP (CLINITEK) (Completed)   [id number] 11+Oxyco+Alc+Crt-Bund   Underweight   Relevant Orders   TSH   Hepatic function panel   Basic metabolic panel      Meds ordered  this encounter  Medications  . oxyCODONE-acetaminophen (PERCOCET) 10-325 MG tablet    Sig: Take 1 tablet by mouth every 6 (six) hours as needed for up to 15 days for pain.    Dispense:  30 tablet    Refill:  0    Order Specific Question:   Supervising Provider    Answer:   Tresa Garter [id number]  . OXYCONTIN 20 MG 12 hr tablet    Sig: Take 1 tablet (20 mg total) by mouth every 12 (twelve) hours.    Dispense:  60 tablet    Refill:  0    Order Specific Question:   Supervising Provider    Answer:   Tresa Garter [id number]    Follow-up: Return in about 2 months (around 08/14/2019).    Vevelyn Francois, NP

## 2019-06-14 NOTE — Patient Instructions (Signed)
BMI for Adults What is BMI? Body mass index (BMI) is a number that is calculated from a person's weight and height. BMI can help estimate how much of a person's weight is composed of fat. BMI does not measure body fat directly. Rather, it is an alternative to procedures that directly measure body fat, which can be difficult and expensive. BMI can help identify people who may be at higher risk for certain medical problems. What are BMI measurements used for? BMI is used as a screening tool to identify possible weight problems. It helps determine whether a person is obese, overweight, a healthy weight, or underweight. BMI is useful for:  Identifying a weight problem that may be related to a medical condition or may increase the risk for medical problems.  Promoting changes, such as changes in diet and exercise, to help reach a healthy weight. BMI screening can be repeated to see if these changes are working. How is BMI calculated? BMI involves measuring your weight in relation to your height. Both height and weight are measured, and the BMI is calculated from those numbers. This can be done either in [place] ([place] or metric measurements. Note that charts and online BMI calculators are available to help you find your BMI quickly and easily without having to do these calculations yourself. To calculate your BMI in [place] (U.S.) measurements:  1. Measure your weight in pounds (lb). 2. Multiply the number of pounds by 703. ? For example, for a person who weighs 180 lb, multiply that number by 703, which equals 126,540. 3. Measure your height in inches. Then multiply that number by itself to get a measurement called "inches squared." ? For example, for a person who is 70 inches tall, the "inches squared" measurement is 70 inches x 70 inches, which equals 4,900 inches squared. 4. Divide the total from step 2 (number of lb x 703) by the total from step 3 (inches squared): 126,540  4,900 = 25.8. This is  your BMI. To calculate your BMI in metric measurements: 1. Measure your weight in kilograms (kg). 2. Measure your height in meters (m). Then multiply that number by itself to get a measurement called "meters squared." ? For example, for a person who is 1.75 m tall, the "meters squared" measurement is 1.75 m x 1.75 m, which is equal to 3.1 meters squared. 3. Divide the number of kilograms (your weight) by the meters squared number. In this example: 70  3.1 = 22.6. This is your BMI. What do the results mean? BMI charts are used to identify whether you are underweight, normal weight, overweight, or obese. The following guidelines will be used:  Underweight: BMI less than 18.5.  Normal weight: BMI between 18.5 and 24.9.  Overweight: BMI between 25 and 29.9.  Obese: BMI of 30 or above. Keep these notes in mind:  Weight includes both fat and muscle, so someone with a muscular build, such as an athlete, may have a BMI that is higher than 24.9. In cases like these, BMI is not an accurate measure of body fat.  To determine if excess body fat is the cause of a BMI of 25 or higher, further assessments may need to be done by a health care provider.  BMI is usually interpreted in the same way for men and women. Where to find more information For more information about BMI, including tools to quickly calculate your BMI, go to these websites:  Centers for Disease Control and Prevention: http://www.wolf.info/  American  Heart Association: www.heart.org  National Heart, Lung, and Blood Institute: https://wilson-eaton.com/ Summary  Body mass index (BMI) is a number that is calculated from a person's weight and height.  BMI may help estimate how much of a person's weight is composed of fat. BMI can help identify those who may be at higher risk for certain medical problems.  BMI can be measured using English measurements or metric measurements.  BMI charts are used to identify whether you are underweight, normal  weight, overweight, or obese. This information is not intended to replace advice given to you by your health care provider. Make sure you discuss any questions you have with your health care provider. Document Revised: 10/24/2018 Document Reviewed: 08/31/2018 Elsevier Patient Education  [place] Following a healthy eating pattern may help you to achieve and maintain a healthy body weight, reduce the risk of chronic disease, and live a long and productive life. It is important to follow a healthy eating pattern at an appropriate calorie level for your body. Your nutritional needs should be met primarily through food by choosing a variety of nutrient-rich foods. What are tips for following this plan? Reading food labels  Read labels and choose the following: ? Reduced or low sodium. ? Juices with 100% fruit juice. ? Foods with low saturated fats and high polyunsaturated and monounsaturated fats. ? Foods with whole grains, such as whole wheat, cracked wheat, brown rice, and wild rice. ? Whole grains that are fortified with folic acid. This is recommended for women who are pregnant or who want to become pregnant.  Read labels and avoid the following: ? Foods with a lot of added sugars. These include foods that contain brown sugar, corn sweetener, corn syrup, dextrose, fructose, glucose, high-fructose corn syrup, honey, invert sugar, lactose, malt syrup, maltose, molasses, raw sugar, sucrose, trehalose, or turbinado sugar.  Do not eat more than the following amounts of added sugar per day:  6 teaspoons (25 g) for women.  9 teaspoons (38 g) for men. ? Foods that contain processed or refined starches and grains. ? Refined grain products, such as white flour, degermed cornmeal, white bread, and white rice. Shopping  Choose nutrient-rich snacks, such as vegetables, whole fruits, and nuts. Avoid high-calorie and high-sugar snacks, such as potato chips, fruit snacks, and  candy.  Use oil-based dressings and spreads on foods instead of solid fats such as butter, stick margarine, or cream cheese.  Limit pre-made sauces, mixes, and "instant" products such as flavored rice, instant noodles, and ready-made pasta.  Try more plant-protein sources, such as tofu, tempeh, black beans, edamame, lentils, nuts, and seeds.  Explore eating plans such as the Mediterranean diet or vegetarian diet. Cooking  Use oil to saut or stir-fry foods instead of solid fats such as butter, stick margarine, or lard.  Try baking, boiling, grilling, or broiling instead of frying.  Remove the fatty part of meats before cooking.  Steam vegetables in water or broth. Meal planning   At meals, imagine dividing your plate into fourths: ? One-half of your plate is fruits and vegetables. ? One-fourth of your plate is whole grains. ? One-fourth of your plate is protein, especially lean meats, poultry, eggs, tofu, beans, or nuts.  Include low-fat dairy as part of your daily diet. Lifestyle  Choose healthy options in all settings, including home, work, school, restaurants, or stores.  Prepare your food safely: ? Wash your hands after handling raw meats. ? Keep food preparation surfaces clean by regularly  washing with hot, soapy water. ? Keep raw meats separate from ready-to-eat foods, such as fruits and vegetables. ? Cook seafood, meat, poultry, and eggs to the recommended internal temperature. ? Store foods at safe temperatures. In general:  Keep cold foods at 50F (4.4C) or below.  Keep hot foods at 150F (60C) or above.  Keep your freezer at [facility] (-17.8C) or below.  Foods are no longer safe to eat when they have been between the temperatures of 40-150F (4.4-60C) for more than 2 hours. What foods should I eat? Fruits Aim to eat 2 cup-equivalents of fresh, canned (in natural juice), or frozen fruits each day. Examples of 1 cup-equivalent of fruit include 1 small apple, 8  large strawberries, 1 cup canned fruit,  cup dried fruit, or 1 cup 100% juice. Vegetables Aim to eat 2-3 cup-equivalents of fresh and frozen vegetables each day, including different varieties and colors. Examples of 1 cup-equivalent of vegetables include 2 medium carrots, 2 cups raw, leafy greens, 1 cup chopped vegetable (raw or cooked), or 1 medium baked potato. Grains Aim to eat 6 ounce-equivalents of whole grains each day. Examples of 1 ounce-equivalent of grains include 1 slice of bread, 1 cup ready-to-eat cereal, 3 cups popcorn, or  cup cooked rice, pasta, or cereal. Meats and other proteins Aim to eat 5-6 ounce-equivalents of protein each day. Examples of 1 ounce-equivalent of protein include 1 egg, 1/2 cup nuts or seeds, or 1 tablespoon (16 g) peanut butter. A cut of meat or fish that is the size of a deck of cards is about 3-4 ounce-equivalents.  Of the protein you eat each week, try to have at least 8 ounces come from seafood. This includes salmon, trout, herring, and anchovies. Dairy Aim to eat 3 cup-equivalents of fat-free or low-fat dairy each day. Examples of 1 cup-equivalent of dairy include 1 cup (240 mL) milk, 8 ounces (250 g) yogurt, 1 ounces (44 g) natural cheese, or 1 cup (240 mL) fortified soy milk. Fats and oils  Aim for about 5 teaspoons (21 g) per day. Choose monounsaturated fats, such as canola and olive oils, avocados, peanut butter, and most nuts, or polyunsaturated fats, such as sunflower, corn, and soybean oils, walnuts, pine nuts, sesame seeds, sunflower seeds, and flaxseed. Beverages  Aim for six 8-oz glasses of water per day. Limit coffee to three to five 8-oz cups per day.  Limit caffeinated beverages that have added calories, such as soda and energy drinks.  Limit alcohol intake to no more than 1 drink a day for nonpregnant women and 2 drinks a day for men. One drink equals 12 oz of beer (355 mL), 5 oz of wine (148 mL), or 1 oz of hard liquor (44  mL). Seasoning and other foods  Avoid adding excess amounts of salt to your foods. Try flavoring foods with herbs and spices instead of salt.  Avoid adding sugar to foods.  Try using oil-based dressings, sauces, and spreads instead of solid fats. This information is based on general U.S. nutrition guidelines. For more information, visit BuildDNA.es. Exact amounts may vary based on your nutrition needs. Summary  A healthy eating plan may help you to maintain a healthy weight, reduce the risk of chronic diseases, and stay active throughout your life.  Plan your meals. Make sure you eat the right portions of a variety of nutrient-rich foods.  Try baking, boiling, grilling, or broiling instead of frying.  Choose healthy options in all settings, including home, work, school, restaurants, or  stores. This information is not intended to replace advice given to you by your health care provider. Make sure you discuss any questions you have with your health care provider. Document Revised: 05/15/2017 Document Reviewed: 05/15/2017 Elsevier Patient Education  [place].

## 2019-06-14 NOTE — Telephone Encounter (Signed)
resent

## 2019-06-17 MED FILL — OxyCONTIN 20 MG T12A: 20 | 30 days supply | Qty: 60 | Fill #0

## 2019-06-20 LAB — CANNABINOID CONFIRMATION, UR
CANNABINOIDS: POSITIVE — AB
Carboxy THC GC/MS Conf: >300 ng/mL

## 2019-06-20 LAB — DRUG SCREEN 764883 11+OXYCO+ALC+CRT-BUND
Amphetamines, Urine: NEGATIVE ng/mL
BENZODIAZ UR QL: NEGATIVE ng/mL
Barbiturate: NEGATIVE ng/mL
Cocaine (Metabolite): NEGATIVE ng/mL
Creatinine: 69.3 mg/dL (ref 20.0–300.0)
Ethanol: NEGATIVE %
Meperidine: NEGATIVE ng/mL
Methadone Screen, Urine: NEGATIVE ng/mL
OPIATE SCREEN URINE: NEGATIVE ng/mL
Oxycodone/Oxymorphone, Urine: NEGATIVE ng/mL
Phencyclidine: NEGATIVE ng/mL
Propoxyphene: NEGATIVE ng/mL
Tramadol: NEGATIVE ng/mL
pH, Urine: 7.8 (ref 4.5–8.9)

## 2019-06-26 ENCOUNTER — Other Ambulatory Visit: Payer: Self-pay | Admitting: Nurse Practitioner

## 2019-06-26 ENCOUNTER — Telehealth: Payer: Self-pay | Admitting: Family Medicine

## 2019-06-26 DIAGNOSIS — G894 Chronic pain syndrome: Secondary | ICD-10-CM

## 2019-06-26 MED ORDER — OXYCODONE-ACETAMINOPHEN 10-325 MG PO TABS: 1.0000 | ORAL_TABLET | Freq: Four times a day (QID) | ORAL | 0 refills | Status: DC | PRN

## 2019-06-26 NOTE — Telephone Encounter (Signed)
Ok sent!

## 2019-06-26 NOTE — Telephone Encounter (Signed)
Called he said that 1 tablet  every 4 hours as prescribed.

## 2019-06-26 NOTE — Telephone Encounter (Signed)
Called to notified  that his rx was sent to pharmacy.

## 2019-06-26 NOTE — Telephone Encounter (Signed)
Please call and see how he is taking the Oxycodone/APAP  Thanks

## 2019-07-08 ENCOUNTER — Other Ambulatory Visit: Payer: Self-pay

## 2019-07-08 ENCOUNTER — Telehealth (HOSPITAL_COMMUNITY): Payer: Self-pay | Admitting: General Practice

## 2019-07-08 ENCOUNTER — Emergency Department (HOSPITAL_COMMUNITY)
Admission: EM | Admit: 2019-07-08 | Discharge: 2019-07-08 | Disposition: A | Discharge status: Home / Self Care | Payer: Medicaid Other | Attending: Emergency Medicine | Admitting: Emergency Medicine

## 2019-07-08 ENCOUNTER — Encounter (HOSPITAL_COMMUNITY): Payer: Self-pay | Admitting: *Deleted

## 2019-07-08 DIAGNOSIS — D5700 Hb-SS disease with crisis, unspecified: Secondary | ICD-10-CM | POA: Insufficient documentation

## 2019-07-08 DIAGNOSIS — Z79899 Other long term (current) drug therapy: Secondary | ICD-10-CM | POA: Diagnosis not present

## 2019-07-08 DIAGNOSIS — M549 Dorsalgia, unspecified: Secondary | ICD-10-CM | POA: Diagnosis present

## 2019-07-08 LAB — COMPREHENSIVE METABOLIC PANEL
ALT: 11 U/L (ref 0–44)
AST: 24 U/L (ref 15–41)
Albumin: 4.6 g/dL (ref 3.5–5.0)
Alkaline Phosphatase: 78 U/L (ref 38–126)
Anion gap: 8 (ref 5–15)
BUN: <5 mg/dL — ABNORMAL LOW (ref 6–20)
CO2: 28 mmol/L (ref 22–32)
Calcium: 8.9 mg/dL (ref 8.9–10.3)
Chloride: 103 mmol/L (ref 98–111)
Creatinine, Ser: 0.69 mg/dL (ref 0.61–1.24)
GFR calc Af Amer: >60 mL/min (ref >60)
GFR calc non Af Amer: >60 mL/min (ref >60)
Glucose, Bld: 102 mg/dL — ABNORMAL HIGH (ref 70–99)
Potassium: 3.4 mmol/L — ABNORMAL LOW (ref 3.5–5.1)
Sodium: 139 mmol/L (ref 135–145)
Total Bilirubin: 2.1 mg/dL — ABNORMAL HIGH (ref 0.3–1.2)
Total Protein: 7.7 g/dL (ref 6.5–8.1)

## 2019-07-08 LAB — CBC WITH DIFFERENTIAL/PLATELET
Abs Immature Granulocytes: 0.04 10*3/uL (ref 0.00–0.07)
Basophils Absolute: 0 10*3/uL (ref 0.0–0.1)
Basophils Relative: 0 %
Eosinophils Absolute: 0.4 10*3/uL (ref 0.0–0.5)
Eosinophils Relative: 3 %
HCT: 27.8 % — ABNORMAL LOW (ref 39.0–52.0)
Hemoglobin: 9.6 g/dL — ABNORMAL LOW (ref 13.0–17.0)
Immature Granulocytes: 0 %
Lymphocytes Relative: 29 %
Lymphs Abs: 3.6 10*3/uL (ref 0.7–4.0)
MCH: 29 pg (ref 26.0–34.0)
MCHC: 34.5 g/dL (ref 30.0–36.0)
MCV: 84 fL (ref 80.0–100.0)
Monocytes Absolute: 0.9 10*3/uL (ref 0.1–1.0)
Monocytes Relative: 7 %
Neutro Abs: 7.3 10*3/uL (ref 1.7–7.7)
Neutrophils Relative %: 61 %
Platelets: 502 10*3/uL — ABNORMAL HIGH (ref 150–400)
RBC: 3.31 MIL/uL — ABNORMAL LOW (ref 4.22–5.81)
RDW: 21.7 % — ABNORMAL HIGH (ref 11.5–15.5)
WBC: 12.2 10*3/uL — ABNORMAL HIGH (ref 4.0–10.5)
nRBC: 0.9 % — ABNORMAL HIGH (ref 0.0–0.2)

## 2019-07-08 LAB — RETICULOCYTES
Immature Retic Fract: 33.4 % — ABNORMAL HIGH (ref 2.3–15.9)
RBC.: 3.31 MIL/uL — ABNORMAL LOW (ref 4.22–5.81)
Retic Count, Absolute: 291.6 10*3/uL — ABNORMAL HIGH (ref 19.0–186.0)
Retic Ct Pct: 8.8 % — ABNORMAL HIGH (ref 0.4–3.1)

## 2019-07-08 MED ORDER — DIPHENHYDRAMINE HCL 25 MG PO CAPS
25.0000 mg | ORAL_CAPSULE | Freq: Once | ORAL | Status: AC
  Administered 2019-07-08: 25 mg via ORAL
  Filled 2019-07-08: qty 1

## 2019-07-08 MED ORDER — HYDROMORPHONE HCL 2 MG/ML IJ SOLN
2.0000 mg | Freq: Once | INTRAMUSCULAR | Status: AC
  Administered 2019-07-08: 2 mg via INTRAVENOUS
  Filled 2019-07-08: qty 1

## 2019-07-08 MED ORDER — SODIUM CHLORIDE 0.9 % IV BOLUS
1000.0000 mL | Freq: Once | INTRAVENOUS | Status: AC
  Administered 2019-07-08: 1000 mL via INTRAVENOUS

## 2019-07-08 MED ORDER — SODIUM CHLORIDE 0.9% FLUSH
3.0000 mL | Freq: Once | INTRAVENOUS | Status: AC
  Administered 2019-07-08: 3 mL via INTRAVENOUS

## 2019-07-08 NOTE — ED Provider Notes (Signed)
[facility] DEPT Provider Note   CSN: [id number] Arrival date & time: 07/08/19  1100     History Chief Complaint  Patient presents with  . Sickle Cell Pain Crisis    Timothy Marks is a 29 y.o. male.  Patient is a 29 year old male with past medical history of sickle cell disease presenting with complaints of pain in his back.  This started yesterday and is rapidly worsening.  Patient states this is similar to prior sickle cell crises.  He denies fevers or chills.  He denies chest pain or difficulty breathing.  The history is provided by the patient.  Sickle Cell Pain Crisis Location:  Back Severity:  Severe Onset quality:  Sudden Duration:  1 day Timing:  Constant Progression:  Worsening Chronicity:  Recurrent Relieved by:  Nothing Worsened by:  Nothing Ineffective treatments:  None tried      Past Medical History:  Diagnosis Date  . Depression   . Generalized weakness 03/31/2019  . 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 Sickle cell anemia (HCC)   . Vitamin D deficiency y-6    Patient Active Problem List   Diagnosis Date Noted  . Underweight 06/14/2019  . Generalized weakness 03/31/2019  . Sickle-cell crisis ([place]) 03/24/2019  . SIRS (systemic inflammatory response syndrome) ([place]) 02/10/2019  . Abnormal liver function 11/04/2018  . Sickle cell anemia with crisis ([place]) 05/22/2018  . Acute bilateral low back pain without sciatica   . Other chronic pain   . Anemia   . 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 Physical deconditioning   . Agitation   . Chronic pain syndrome   . Recurrent cold sores   . Fever   . Pain   . Sickle cell crisis ([place]) 07/16/2017  . Adjustment disorder with mixed disturbance of emotions and conduct   . Constipation 08/03/2014  . Leukocytosis 06/30/2014  . 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 Protein-calorie malnutrition, severe ([place]) 04/09/2014  . Major depressive  disorder, recurrent, severe without psychotic features ([place])   . 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 Vitamin D deficiency   . Sickle cell anemia ([place]) 03/30/2014  . Hyperbilirubinemia 03/30/2014  . Hypokalemia 02/07/2014  . Aggressive behavior 01/25/2013  . Sickle cell anemia with pain ([place]) 01/24/2013  . Sickle cell pain crisis ([place]) 01/24/2013    Past Surgical History:  Procedure Laterality Date  . CHOLECYSTECTOMY         Family History  Problem Relation Age of Onset  . Sickle cell trait Mother   . Sickle cell trait Father     Social History   Tobacco Use  . Smoking status: Never Smoker  . Smokeless tobacco: Never Used  Substance Use Topics  . Alcohol use: No  . Drug use: Not Currently    Types: Marijuana    Home Medications Prior to Admission medications   Medication Sig Start Date End Date Taking? Authorizing Provider  cholecalciferol (VITAMIN D) 1000 units tablet Take 2 tablets (2,000 Units total) by mouth daily. 12/13/17   Azzie Glatter, FNP  folic acid (FOLVITE) 1 MG tablet Take 1 tablet (1 mg total) by mouth daily. 05/01/17   Dorena Dew, FNP  gabapentin (NEURONTIN) 300 MG capsule Take 1 capsule (300 mg total) by mouth 3 (three) times daily. 03/12/19   Dorena Dew, FNP  hydroxyurea (HYDREA) 500 MG capsule Take 1 capsule (500 mg total) by mouth 2 (  two) times daily. May take with food to minimize GI side effects. 05/01/17   Dorena Dew, FNP  ibuprofen (ADVIL) 800 MG tablet Take 1 tablet (800 mg total) by mouth 3 (three) times daily as needed (pain). 03/12/19   Dorena Dew, FNP  Nutritional Supplements (ENSURE ACTIVE HIGH PROTEIN) LIQD Take 1 each by mouth 3 (three) times daily between meals. #90 cans of Ensure 11/13/18   Dorena Dew, FNP  oxyCODONE-acetaminophen (PERCOCET) 10-325 MG tablet Take 1 tablet by mouth every 6 (six) hours as needed for up to 15 days for pain. 06/29/19 07/14/19  Vevelyn Francois,  NP  OXYCONTIN 20 MG 12 hr tablet Take 1 tablet (20 mg total) by mouth every 12 (twelve) hours. 06/14/19 07/14/19  Vevelyn Francois, NP    Allergies    Buprenorphine hcl, Levaquin [levofloxacin], Meperidine, Morphine, Morphine and related, Toradol [ketorolac tromethamine], Tramadol, Vancomycin, and Zosyn [piperacillin sod-tazobactam so]  Review of Systems   Review of Systems  All other systems reviewed and are negative.   Physical Exam Updated Vital Signs BP 120/70 (BP Location: Right Arm)   Pulse 79   Temp 98.4 F (36.9 C) (Oral)   Resp 17   Ht 6\' 2"  (1.88 m)   Wt 61.2 kg   SpO2 100%   BMI 17.33 kg/m   Physical Exam Vitals and nursing note reviewed.  Constitutional:      General: He is not in acute distress.    Appearance: He is well-developed. He is not diaphoretic.  HENT:     Head: Normocephalic and atraumatic.  Cardiovascular:     Rate and Rhythm: Normal rate and regular rhythm.     Heart sounds: No murmur. No friction rub.  Pulmonary:     Effort: Pulmonary effort is normal. No respiratory distress.     Breath sounds: Normal breath sounds. No wheezing or rales.  Abdominal:     General: Bowel sounds are normal. There is no distension.     Palpations: Abdomen is soft.     Tenderness: There is no abdominal tenderness.  Musculoskeletal:        General: No swelling or tenderness. Normal range of motion.     Cervical back: Normal range of motion and neck supple.     Right lower leg: No edema.     Left lower leg: No edema.  Skin:    General: Skin is warm and dry.  Neurological:     Mental Status: He is alert and oriented to person, place, and time.     Coordination: Coordination normal.     ED Results / Procedures / Treatments   Labs (all labs ordered are listed, but only abnormal results are displayed) Labs Reviewed  COMPREHENSIVE METABOLIC PANEL  CBC WITH DIFFERENTIAL/PLATELET  RETICULOCYTES    EKG None  Radiology No results  found.  Procedures Procedures (including critical care time)  Medications Ordered in ED Medications  sodium chloride flush (NS) 0.9 % injection 3 mL (has no administration in time range)  sodium chloride 0.9 % bolus 1,000 mL (has no administration in time range)  HYDROmorphone (DILAUDID) injection 2 mg (has no administration in time range)  diphenhydrAMINE (BENADRYL) capsule 25 mg (has no administration in time range)    ED Course  I have reviewed the triage vital signs and the nursing notes.  Pertinent labs & imaging results that were available during my care of the patient were reviewed by me and considered in my medical decision making (see  chart for details).    MDM Rules/Calculators/A&P  Patient with history of sickle cell disease presenting with what appears to be a sickle cell crisis.  Labs are consistent with baseline and he appears clinically well.  Pain has improved with medications and seems appropriate for discharge.  Final Clinical Impression(s) / ED Diagnoses Final diagnoses:  None    Rx / DC Orders ED Discharge Orders    None       Veryl Speak, MD 07/08/19 1448

## 2019-07-08 NOTE — Discharge Instructions (Addendum)
Continue medications as previously prescribed.  Follow-up in the sickle cell clinic as needed.

## 2019-07-08 NOTE — ED Notes (Signed)
Patient refused labs stated he only wanted to get stuck one time

## 2019-07-08 NOTE — Telephone Encounter (Signed)
Patient called requesting to come to the day hospital for pain management. Per provider the [facility] is full today. Patient told to continue to take pain medications as prescribed, go to the ER is necessary or call back early tomorrow morning for triage. Patient verbalized understanding.

## 2019-07-08 NOTE — ED Triage Notes (Signed)
SCC pain started last night in back and left foot.

## 2019-07-11 ENCOUNTER — Other Ambulatory Visit: Payer: Self-pay | Admitting: Hematology

## 2019-07-11 ENCOUNTER — Other Ambulatory Visit: Payer: Self-pay | Admitting: Nurse Practitioner

## 2019-07-11 DIAGNOSIS — Z79891 Long term (current) use of opiate analgesic: Secondary | ICD-10-CM

## 2019-07-11 DIAGNOSIS — G894 Chronic pain syndrome: Secondary | ICD-10-CM

## 2019-07-11 MED ORDER — OXYCONTIN 20 MG PO T12A: 20.0000 mg | EXTENDED_RELEASE_TABLET | Freq: Two times a day (BID) | ORAL | 0 refills | Status: DC

## 2019-07-11 NOTE — Telephone Encounter (Signed)
Completed for  future refill date. Thanks

## 2019-07-11 NOTE — Progress Notes (Signed)
Indication for chronic opioid: CPS with SCD Medication and dose: Oxycontin 20 Q12 # pills per month: 60 Last UDS date: 06/17/2019 Opioid Treatment Agreement signed (Y/N): Y Opioid Treatment Agreement last reviewed with patient:  2021 [place] reviewed this encounter (include red flags):  N

## 2019-07-11 NOTE — Telephone Encounter (Signed)
Patient requesting refill on medication.

## 2019-07-12 ENCOUNTER — Telehealth (HOSPITAL_COMMUNITY): Payer: Self-pay | Admitting: General Practice

## 2019-07-12 ENCOUNTER — Non-Acute Institutional Stay (HOSPITAL_COMMUNITY)
Admission: AD | Admit: 2019-07-12 | Discharge: 2019-07-12 | Disposition: A | Discharge status: Home / Self Care | Payer: Medicaid Other | Source: Ambulatory Visit | Attending: Internal Medicine | Admitting: Internal Medicine

## 2019-07-12 ENCOUNTER — Encounter (HOSPITAL_COMMUNITY): Payer: Self-pay | Admitting: Internal Medicine

## 2019-07-12 ENCOUNTER — Telehealth: Payer: Self-pay

## 2019-07-12 DIAGNOSIS — Z888 Allergy status to other drugs, medicaments and biological substances status: Secondary | ICD-10-CM | POA: Insufficient documentation

## 2019-07-12 DIAGNOSIS — M549 Dorsalgia, unspecified: Secondary | ICD-10-CM | POA: Diagnosis not present

## 2019-07-12 DIAGNOSIS — M79604 Pain in right leg: Secondary | ICD-10-CM | POA: Insufficient documentation

## 2019-07-12 DIAGNOSIS — D573 Sickle-cell trait: Secondary | ICD-10-CM | POA: Diagnosis not present

## 2019-07-12 DIAGNOSIS — Z885 Allergy status to narcotic agent status: Secondary | ICD-10-CM | POA: Diagnosis not present

## 2019-07-12 DIAGNOSIS — Z79899 Other long term (current) drug therapy: Secondary | ICD-10-CM | POA: Insufficient documentation

## 2019-07-12 DIAGNOSIS — F1120 Opioid dependence, uncomplicated: Secondary | ICD-10-CM | POA: Insufficient documentation

## 2019-07-12 DIAGNOSIS — Z881 Allergy status to other antibiotic agents status: Secondary | ICD-10-CM | POA: Insufficient documentation

## 2019-07-12 DIAGNOSIS — Z832 Family history of diseases of the blood and blood-forming organs and certain disorders involving the immune mechanism: Secondary | ICD-10-CM | POA: Diagnosis not present

## 2019-07-12 DIAGNOSIS — D5700 Hb-SS disease with crisis, unspecified: Secondary | ICD-10-CM | POA: Diagnosis present

## 2019-07-12 DIAGNOSIS — Z880 Allergy status to penicillin: Secondary | ICD-10-CM | POA: Diagnosis not present

## 2019-07-12 DIAGNOSIS — G894 Chronic pain syndrome: Secondary | ICD-10-CM | POA: Diagnosis not present

## 2019-07-12 DIAGNOSIS — M79605 Pain in left leg: Secondary | ICD-10-CM | POA: Diagnosis not present

## 2019-07-12 LAB — COMPREHENSIVE METABOLIC PANEL
ALT: 10 U/L (ref 0–44)
AST: 23 U/L (ref 15–41)
Albumin: 4.7 g/dL (ref 3.5–5.0)
Alkaline Phosphatase: 79 U/L (ref 38–126)
Anion gap: 9 (ref 5–15)
BUN: 5 mg/dL — ABNORMAL LOW (ref 6–20)
CO2: 26 mmol/L (ref 22–32)
Calcium: 9.3 mg/dL (ref 8.9–10.3)
Chloride: 104 mmol/L (ref 98–111)
Creatinine, Ser: 0.6 mg/dL — ABNORMAL LOW (ref 0.61–1.24)
GFR calc Af Amer: >60 mL/min (ref >60)
GFR calc non Af Amer: >60 mL/min (ref >60)
Glucose, Bld: 88 mg/dL (ref 70–99)
Potassium: 4.3 mmol/L (ref 3.5–5.1)
Sodium: 139 mmol/L (ref 135–145)
Total Bilirubin: 2 mg/dL — ABNORMAL HIGH (ref 0.3–1.2)
Total Protein: 8.1 g/dL (ref 6.5–8.1)

## 2019-07-12 LAB — CBC WITH DIFFERENTIAL/PLATELET
Abs Immature Granulocytes: 0.03 10*3/uL (ref 0.00–0.07)
Basophils Absolute: 0 10*3/uL (ref 0.0–0.1)
Basophils Relative: 1 %
Eosinophils Absolute: 0.5 10*3/uL (ref 0.0–0.5)
Eosinophils Relative: 5 %
HCT: 28.8 % — ABNORMAL LOW (ref 39.0–52.0)
Hemoglobin: 9.7 g/dL — ABNORMAL LOW (ref 13.0–17.0)
Immature Granulocytes: 0 %
Lymphocytes Relative: 23 %
Lymphs Abs: 2 10*3/uL (ref 0.7–4.0)
MCH: 28.5 pg (ref 26.0–34.0)
MCHC: 33.7 g/dL (ref 30.0–36.0)
MCV: 84.7 fL (ref 80.0–100.0)
Monocytes Absolute: 0.7 10*3/uL (ref 0.1–1.0)
Monocytes Relative: 8 %
Neutro Abs: 5.6 10*3/uL (ref 1.7–7.7)
Neutrophils Relative %: 63 %
Platelets: 565 10*3/uL — ABNORMAL HIGH (ref 150–400)
RBC: 3.4 MIL/uL — ABNORMAL LOW (ref 4.22–5.81)
RDW: 21.2 % — ABNORMAL HIGH (ref 11.5–15.5)
WBC: 8.7 10*3/uL (ref 4.0–10.5)
nRBC: 1.5 % — ABNORMAL HIGH (ref 0.0–0.2)

## 2019-07-12 LAB — RETICULOCYTES
Immature Retic Fract: 36 % — ABNORMAL HIGH (ref 2.3–15.9)
RBC.: 3.35 MIL/uL — ABNORMAL LOW (ref 4.22–5.81)
Retic Count, Absolute: 327.3 10*3/uL — ABNORMAL HIGH (ref 19.0–186.0)
Retic Ct Pct: 9.8 % — ABNORMAL HIGH (ref 0.4–3.1)

## 2019-07-12 MED ORDER — HYDROMORPHONE 1 MG/ML IV SOLN
INTRAVENOUS | Status: DC
  Administered 2019-07-12: 11.5 mg via INTRAVENOUS
  Administered 2019-07-12: 30 mg via INTRAVENOUS
  Filled 2019-07-12: qty 30

## 2019-07-12 MED ORDER — SODIUM CHLORIDE 0.45 % IV SOLN: INTRAVENOUS | Status: DC

## 2019-07-12 MED ORDER — SENNOSIDES-DOCUSATE SODIUM 8.6-50 MG PO TABS: 1.0000 | ORAL_TABLET | Freq: Two times a day (BID) | ORAL | Status: DC

## 2019-07-12 MED ORDER — SODIUM CHLORIDE 0.9% FLUSH: 9.0000 mL | INTRAVENOUS | Status: DC | PRN

## 2019-07-12 MED ORDER — DIPHENHYDRAMINE HCL 25 MG PO CAPS: 25.0000 mg | ORAL_CAPSULE | ORAL | Status: DC | PRN

## 2019-07-12 MED ORDER — SODIUM CHLORIDE 0.9 % IV SOLN
25.0000 mg | INTRAVENOUS | Status: DC | PRN
  Filled 2019-07-12: qty 0.5

## 2019-07-12 MED ORDER — POLYETHYLENE GLYCOL 3350 17 G PO PACK: 17.0000 g | PACK | Freq: Every day | ORAL | Status: DC | PRN

## 2019-07-12 MED ORDER — NALOXONE HCL 0.4 MG/ML IJ SOLN: 0.4000 mg | INTRAMUSCULAR | Status: DC | PRN

## 2019-07-12 MED ORDER — ONDANSETRON HCL 4 MG/2ML IJ SOLN
4.0000 mg | Freq: Four times a day (QID) | INTRAMUSCULAR | Status: DC | PRN
  Administered 2019-07-12: 4 mg via INTRAVENOUS
  Filled 2019-07-12: qty 2

## 2019-07-12 NOTE — Telephone Encounter (Signed)
Pt called in refill on percocet 

## 2019-07-12 NOTE — Discharge Instructions (Signed)
Sickle Cell Anemia, Adult  Sickle cell anemia is a condition where your red blood cells are shaped like sickles. Red blood cells carry oxygen through the body. Sickle-shaped cells do not live as long as normal red blood cells. They also clump together and block blood from flowing through the blood vessels. This prevents the body from getting enough oxygen. Sickle cell anemia causes organ damage and pain. It also increases the risk of infection. Follow these instructions at home: Medicines  Take over-the-counter and prescription medicines only as told by your doctor.  If you were prescribed an antibiotic medicine, take it as told by your doctor. Do not stop taking the antibiotic even if you start to feel better.  If you develop a fever, do not take medicines to lower the fever right away. Tell your doctor about the fever. Managing pain, stiffness, and swelling  Try these methods to help with pain: ? Use a heating pad. ? Take a warm bath. ? Distract yourself, such as by watching TV. Eating and drinking  Drink enough fluid to keep your pee (urine) clear or pale yellow. Drink more in hot weather and during exercise.  Limit or avoid alcohol.  Eat a healthy diet. Eat plenty of fruits, vegetables, whole grains, and lean protein.  Take vitamins and supplements as told by your doctor. Traveling  When traveling, keep these with you: ? Your medical information. ? The names of your doctors. ? Your medicines.  If you need to take an airplane, talk to your doctor first. Activity  Rest often.  Avoid exercises that make your heart beat much faster, such as jogging. General instructions  Do not use products that have nicotine or tobacco, such as cigarettes and e-cigarettes. If you need help quitting, ask your doctor.  Consider wearing a medical alert bracelet.  Avoid being in high places (high altitudes), such as mountains.  Avoid very hot or cold temperatures.  Avoid places where the  temperature changes a lot.  Keep all follow-up visits as told by your doctor. This is important. Contact a doctor if:  A joint hurts.  Your feet or hands hurt or swell.  You feel tired (fatigued). Get help right away if:  You have symptoms of infection. These include: ? Fever. ? Chills. ? Being very tired. ? Irritability. ? Poor eating. ? Throwing up (vomiting).  You feel dizzy or faint.  You have new stomach pain, especially on the left side.  You have a an erection (priapism) that lasts more than 4 hours.  You have numbness in your arms or legs.  You have a hard time moving your arms or legs.  You have trouble talking.  You have pain that does not go away when you take medicine.  You are short of breath.  You are breathing fast.  You have a long-term cough.  You have pain in your chest.  You have a bad headache.  You have a stiff neck.  Your stomach looks bloated even though you did not eat much.  Your skin is pale.  You suddenly cannot see well. Summary  Sickle cell anemia is a condition where your red blood cells are shaped like sickles.  Follow your doctor's advice on ways to manage pain, food to eat, activities to do, and steps to take for safe travel.  Get medical help right away if you have any signs of infection, such as a fever. This information is not intended to replace advice given to you by   your health care provider. Make sure you discuss any questions you have with your health care provider. Document Revised: 05/25/2018 Document Reviewed: 03/08/2016 Elsevier Patient Education  2020 Elsevier Inc.  

## 2019-07-12 NOTE — Telephone Encounter (Signed)
We can not or will not refill medication while the patient is admitted into the hospital. The system does not allow this.  Once this patient has checked out. You can resend me the medication request.  Thanks

## 2019-07-12 NOTE — H&P (Signed)
[name] History and Physical  Timothy Marks [id number] DOB: 1990-02-24 DOA: 07/12/2019  PCP: [facility]   Chief Complaint: Sickle cell pain  HPI: [name] is a 29 y.o. male with history of sickle cell disease, chronic pain syndrome, opiate dependence and tolerance and history of oppositional defiance disorder who presented to the day hospital this morning with complaints of back pain and pain in his lower extremities consistent with his typical sickle cell pain crisis.  Patient stated that he took his home pain medication this morning with no sustained relief.  He rated his pain at 9/10, throbbing and achy and constant.  He denies any fever, chills, headache, shortness of breath, urinary symptoms, nausea, vomiting or diarrhea.  He denies any sick contacts or recent travel, no exposure to COVID-19.  Systemic Review: General: The patient denies anorexia, fever, weight loss Cardiac: Denies chest pain, syncope, palpitations, pedal edema  Respiratory: Denies cough, shortness of breath, wheezing GI: Denies severe indigestion/heartburn, abdominal pain, nausea, vomiting, diarrhea and constipation GU: Denies hematuria, incontinence, dysuria  Musculoskeletal: Denies arthritis  Skin: Denies suspicious skin lesions Neurologic: Denies focal weakness or numbness, change in vision  Past Medical History:  Diagnosis Date  . Depression   . Generalized weakness 03/31/2019  . 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 Sickle cell anemia (HCC)   . Vitamin D deficiency y-6    Past Surgical History:  Procedure Laterality Date  . CHOLECYSTECTOMY      Allergies  Allergen Reactions  . Buprenorphine Hcl Hives  . Levaquin [Levofloxacin] Itching  . Meperidine Rash  . Morphine Hives  . Morphine And Related Hives  . Toradol [Ketorolac Tromethamine] Itching  . Tramadol Hives  . Vancomycin Itching  . Zosyn  [Piperacillin Sod-Tazobactam So] Itching and Rash    Has taken rocephin in past    Family History  Problem Relation Age of Onset  . Sickle cell trait Mother   . Sickle cell trait Father       Prior to Admission medications   Medication Sig Start Date End Date Taking? Authorizing Provider  cholecalciferol (VITAMIN D) 1000 units tablet Take 2 tablets (2,000 Units total) by mouth daily. 12/13/17   Azzie Glatter, FNP  folic acid (FOLVITE) 1 MG tablet Take 1 tablet (1 mg total) by mouth daily. 05/01/17   Dorena Dew, FNP  gabapentin (NEURONTIN) 300 MG capsule Take 1 capsule (300 mg total) by mouth 3 (three) times daily. 03/12/19   Dorena Dew, FNP  hydroxyurea (HYDREA) 500 MG capsule Take 1 capsule (500 mg total) by mouth 2 (two) times daily. May take with food to minimize GI side effects. 05/01/17   Dorena Dew, FNP  ibuprofen (ADVIL) 800 MG tablet Take 1 tablet (800 mg total) by mouth 3 (three) times daily as needed (pain). 03/12/19   Dorena Dew, FNP  Nutritional Supplements (ENSURE ACTIVE HIGH PROTEIN) LIQD Take 1 each by mouth 3 (three) times daily between meals. #90 cans of Ensure 11/13/18   Dorena Dew, FNP  oxyCODONE-acetaminophen (PERCOCET) 10-325 MG tablet Take 1 tablet by mouth every 6 (six) hours as needed for up to 15 days for pain. 06/29/19 07/14/19  Vevelyn Francois, NP  OXYCONTIN 20 MG 12 hr tablet Take 1 tablet (20 mg total) by mouth every 12 (twelve) hours. 07/17/19 08/16/19  Vevelyn Francois, NP     Physical Exam: There were no vitals  filed for this visit.  General: Alert, awake, afebrile, anicteric, not in obvious distress HEENT: Normocephalic and Atraumatic, Mucous membranes pink                PERRLA; EOM intact; No scleral icterus,                 Nares: Patent, Oropharynx: Clear, Fair Dentition                 Neck: FROM, no cervical lymphadenopathy, thyromegaly, carotid bruit or JVD;  CHEST WALL: No tenderness  CHEST: Normal respiration, clear  to auscultation bilaterally  HEART: Regular rate and rhythm; no murmurs rubs or gallops  BACK: No kyphosis or scoliosis; no CVA tenderness  ABDOMEN: Positive Bowel Sounds, soft, non-tender; no masses, no organomegaly EXTREMITIES: No cyanosis, clubbing, or edema SKIN:  no rash or ulceration  CNS: Alert and Oriented x 4, Nonfocal exam, CN 2-12 intact  Labs on Admission:  Basic Metabolic Panel: Recent Labs  Lab 07/08/19 1214  NA 139  K 3.4*  CL 103  CO2 28  GLUCOSE 102*  BUN <5*  CREATININE 0.69  CALCIUM 8.9   Liver Function Tests: Recent Labs  Lab 07/08/19 1214  AST 24  ALT 11  ALKPHOS 78  BILITOT 2.1*  PROT 7.7  ALBUMIN 4.6   No results for input(s): LIPASE, AMYLASE in the last 168 hours. No results for input(s): AMMONIA in the last 168 hours. CBC: Recent Labs  Lab 07/08/19 1214  WBC 12.2*  NEUTROABS 7.3  HGB 9.6*  HCT 27.8*  MCV 84.0  PLT 502*   Cardiac Enzymes: No results for input(s): CKTOTAL, CKMB, CKMBINDEX, TROPONINI in the last 168 hours.  BNP (last 3 results) No results for input(s): BNP in the last 8760 hours.  ProBNP (last 3 results) No results for input(s): PROBNP in the last 8760 hours.  CBG: No results for input(s): GLUCAP in the last 168 hours.   Assessment/Plan Active Problems:   Sickle cell anemia with crisis ([place])   Admits to the Day Hospital  IVF .45% Saline @ 150 mls/hour  Weight based Dilaudid PCA started within 30 minutes of admission  IV Toradol is contraindicated due to allergy  CBC and CMP ordered  Monitor vitals very closely, Re-evaluate pain scale every hour  2 L of Oxygen by [place]  Patient will be re-evaluated for pain in the context of function and relationship to baseline as care progresses.  If no significant relieve from pain (remains above 5/10) will transfer patient to inpatient services for further evaluation and management  Code Status: Full  Family Communication: None  DVT Prophylaxis: Ambulate as  tolerated   Time spent: 35 Minutes  Angelica Chessman, MD, MHA, FACP, FAAP, CPE  If 7PM-7AM, please contact night-coverage [email or web address] 07/12/2019, 9:04 AM

## 2019-07-12 NOTE — Discharge Summary (Signed)
Physician Discharge Summary  Harden Purgason [id number] DOB: 03-07-1990 DOA: 07/12/2019  [facility]P: [facility]  Admit date: 07/12/2019  Discharge date: 07/12/2019  Time spent: 30 minutes  Discharge Diagnoses:  Active Problems:   Sickle cell anemia with crisis [facility])   Discharge Condition: Stable  Diet recommendation: Regular  History of present illness:  Ge Slaski is a 29 y.o. male with history of sickle cell disease, chronic pain syndrome, opiate dependence and tolerance and history of oppositional defiance disorder who presented to the day hospital this morning with complaints of back pain and pain in his lower extremities consistent with his typical sickle cell pain crisis.  Patient stated that he took his home pain medication this morning with no sustained relief.  He rated his pain at 9/10, throbbing and achy and constant.  He denies any fever, chills, headache, shortness of breath, urinary symptoms, nausea, vomiting or diarrhea.  He denies any sick contacts or recent travel, no exposure to COVID-19.  Hospital Course:  Landis Varon was admitted to the day hospital with sickle cell painful crisis. Patient was treated with weight based IV Dilaudid PCA, clinician assisted doses as deemed appropriate and IV fluids. Toradol is contraindicated due to allergy. Thielen showed significant improvement symptomatically, pain improved from 9 to 4/10 at the time of discharge. Patient was discharged home in a hemodynamically stable condition. Majesty will follow-up at the clinic as previously scheduled, continue with home medications as per prior to admission.  Discharge Instructions We discussed the need for good hydration, monitoring of hydration status, avoidance of heat, cold, stress, and infection triggers. We discussed the need to be compliant with taking Hydrea and other home medications. Kayla was reminded of the need to seek medical attention immediately if any symptom of  bleeding, anemia, or infection occurs.  Discharge Exam: Vitals:   07/12/19 0924 07/12/19 1120  BP: 111/60 (!) 99/50  Pulse: 78 73  Resp: 14 10  Temp: 98.4 F (36.9 C)   SpO2: 100% 100%   General appearance: alert, cooperative and no distress Eyes: conjunctivae/corneas clear. PERRL, EOM's intact. Fundi benign. Neck: no adenopathy, no carotid bruit, no JVD, supple, symmetrical, trachea midline and thyroid not enlarged, symmetric, no tenderness/mass/nodules Back: symmetric, no curvature. ROM normal. No CVA tenderness. Resp: clear to auscultation bilaterally Chest wall: no tenderness Cardio: regular rate and rhythm, S1, S2 normal, no murmur, click, rub or gallop GI: soft, non-tender; bowel sounds normal; no masses,  no organomegaly Extremities: extremities normal, atraumatic, no cyanosis or edema Pulses: 2+ and symmetric Skin: Skin color, texture, turgor normal. No rashes or lesions Neurologic: Grossly normal  Discharge Instructions    Diet - low sodium heart healthy   Complete by: As directed    Increase activity slowly   Complete by: As directed      Allergies as of 07/12/2019      Reactions   Buprenorphine Hcl Hives   Levaquin [levofloxacin] Itching   Meperidine Rash   Morphine Hives   Morphine And Related Hives   Toradol [ketorolac Tromethamine] Itching   Tramadol Hives   Vancomycin Itching   Zosyn [piperacillin Sod-tazobactam So] Itching, Rash   Has taken rocephin in past      Medication List    TAKE these medications   cholecalciferol 25 MCG (1000 UNIT) tablet Commonly known as: VITAMIN D Take 2 tablets (2,000 Units total) by mouth daily.   Ensure Active High Protein Liqd Take 1 each by mouth 3 (three) times daily between meals. #90 cans  of Ensure   folic acid 1 MG tablet Commonly known as: FOLVITE Take 1 tablet (1 mg total) by mouth daily.   gabapentin 300 MG capsule Commonly known as: NEURONTIN Take 1 capsule (300 mg total) by mouth 3 (three) times  daily.   hydroxyurea 500 MG capsule Commonly known as: HYDREA Take 1 capsule (500 mg total) by mouth 2 (two) times daily. May take with food to minimize GI side effects.   ibuprofen 800 MG tablet Commonly known as: ADVIL Take 1 tablet (800 mg total) by mouth 3 (three) times daily as needed (pain).   oxyCODONE-acetaminophen 10-325 MG tablet Commonly known as: Percocet Take 1 tablet by mouth every 6 (six) hours as needed for up to 15 days for pain.   OxyCONTIN 20 mg 12 hr tablet Generic drug: oxyCODONE Take 1 tablet (20 mg total) by mouth every 12 (twelve) hours. Start taking on: July 17, 2019      Allergies  Allergen Reactions  . Buprenorphine Hcl Hives  . Levaquin [Levofloxacin] Itching  . Meperidine Rash  . Morphine Hives  . Morphine And Related Hives  . Toradol [Ketorolac Tromethamine] Itching  . Tramadol Hives  . Vancomycin Itching  . Zosyn [Piperacillin Sod-Tazobactam So] Itching and Rash    Has taken rocephin in past     Significant Diagnostic Studies: No results found.  Signed:  Angelica Chessman MD, [place], [place], Julio Sicks, [place]   07/12/2019, 1:31 PM

## 2019-07-12 NOTE — Telephone Encounter (Signed)
Pt called in refill on percocet. Pt should no longer appear as admitted in hospital.

## 2019-07-12 NOTE — Progress Notes (Signed)
Patient admitted to the day hospital for treatment of sickle cell pain crisis. Patient reported pain rated 8/10 in the Back. Patient placed on Dilaudid PCA, given IV Zofran and hydrated with IV fluids. At discharge patient reported  pain at 4/10. Discharge instructions given to patient. Patient alert, oriented and ambulatory at discharge.

## 2019-07-12 NOTE — Telephone Encounter (Signed)
Patient called, requesting to come to the day hospital due to pain in the back rated at 9/10. Denied chest pain, fever, diarrhea, abdominal pain, nausea/vomitting and priapism. Screened negative for Covid-19 symptoms. Admitted to having means of transportation without driving self after treatment. Last took 10 mg of Percocet at 06:00 today. Per provider, patient can come to the day hospital for treatment. Patient notified, verbalized understanding.

## 2019-07-16 ENCOUNTER — Other Ambulatory Visit: Payer: Self-pay | Admitting: Family Medicine

## 2019-07-16 ENCOUNTER — Telehealth: Payer: Self-pay | Admitting: Nurse Practitioner

## 2019-07-16 DIAGNOSIS — G894 Chronic pain syndrome: Secondary | ICD-10-CM

## 2019-07-16 MED ORDER — OXYCODONE-ACETAMINOPHEN 10-325 MG PO TABS: 1.0000 | ORAL_TABLET | Freq: Four times a day (QID) | ORAL | 0 refills | Status: DC | PRN

## 2019-07-16 NOTE — Telephone Encounter (Signed)
Pt called in refill on percocet last week. Now it is overdue. Can someone please fill asap

## 2019-07-26 ENCOUNTER — Telehealth: Payer: Self-pay | Admitting: Internal Medicine

## 2019-07-26 NOTE — Telephone Encounter (Signed)
error 

## 2019-07-29 ENCOUNTER — Telehealth: Payer: Self-pay

## 2019-07-29 NOTE — Telephone Encounter (Signed)
Pt called in refill on percocet

## 2019-07-30 ENCOUNTER — Other Ambulatory Visit: Payer: Self-pay | Admitting: Family Medicine

## 2019-07-30 ENCOUNTER — Telehealth: Payer: Self-pay | Admitting: Nurse Practitioner

## 2019-07-30 DIAGNOSIS — D571 Sickle-cell disease without crisis: Secondary | ICD-10-CM

## 2019-07-30 DIAGNOSIS — G894 Chronic pain syndrome: Secondary | ICD-10-CM

## 2019-07-30 MED ORDER — OXYCODONE-ACETAMINOPHEN 10-325 MG PO TABS: 1.0000 | ORAL_TABLET | Freq: Four times a day (QID) | ORAL | 0 refills | Status: DC | PRN

## 2019-07-31 NOTE — Telephone Encounter (Signed)
Done

## 2019-08-08 ENCOUNTER — Telehealth (HOSPITAL_COMMUNITY): Payer: Self-pay | Admitting: *Deleted

## 2019-08-08 NOTE — Telephone Encounter (Signed)
Patient called requesting to come to the day hospital for sickle cell pain. Patient reports lower back pain rated 8/10. Reports taking Percocet at 8:00 am. COVID-19 screening done and patient denies all symptoms and exposures. Denies fever, chest pain, nausea, vomiting, diarrhea, abdominal pain and priapism. Admits to having transportation without driving self. [place], [place] notified. The day hospital is currently at capacity. Provider advised that patient continue to take prescribed medications around the clock and hydrate with 64 ounces of water. Patient can call back to the day hospital in the morning if pain persists. Patient advised and expresses an understanding.

## 2019-08-12 ENCOUNTER — Telehealth: Payer: Self-pay | Admitting: Nurse Practitioner

## 2019-08-13 ENCOUNTER — Other Ambulatory Visit: Payer: Self-pay

## 2019-08-13 ENCOUNTER — Emergency Department (HOSPITAL_COMMUNITY)
Admission: EM | Admit: 2019-08-13 | Discharge: 2019-08-13 | Discharge status: Against Medical Advice | Payer: Medicaid Other | Attending: Emergency Medicine | Admitting: Emergency Medicine

## 2019-08-13 ENCOUNTER — Encounter (HOSPITAL_COMMUNITY): Payer: Self-pay | Admitting: Emergency Medicine

## 2019-08-13 DIAGNOSIS — Z5321 Procedure and treatment not carried out due to patient leaving prior to being seen by health care provider: Secondary | ICD-10-CM | POA: Insufficient documentation

## 2019-08-13 DIAGNOSIS — D5700 Hb-SS disease with crisis, unspecified: Secondary | ICD-10-CM | POA: Diagnosis present

## 2019-08-13 LAB — RETICULOCYTES
Immature Retic Fract: 34.9 % — ABNORMAL HIGH (ref 2.3–15.9)
RBC.: 3.3 MIL/uL — ABNORMAL LOW (ref 4.22–5.81)
Retic Count, Absolute: 320.5 10*3/uL — ABNORMAL HIGH (ref 19.0–186.0)
Retic Ct Pct: 10.2 % — ABNORMAL HIGH (ref 0.4–3.1)

## 2019-08-13 LAB — CBC WITH DIFFERENTIAL/PLATELET
Abs Immature Granulocytes: 0.03 10*3/uL (ref 0.00–0.07)
Basophils Absolute: 0 10*3/uL (ref 0.0–0.1)
Basophils Relative: 0 %
Eosinophils Absolute: 0.2 10*3/uL (ref 0.0–0.5)
Eosinophils Relative: 3 %
HCT: 29.7 % — ABNORMAL LOW (ref 39.0–52.0)
Hemoglobin: 10.2 g/dL — ABNORMAL LOW (ref 13.0–17.0)
Immature Granulocytes: 1 %
Lymphocytes Relative: 26 %
Lymphs Abs: 1.8 10*3/uL (ref 0.7–4.0)
MCH: 30.2 pg (ref 26.0–34.0)
MCHC: 34.3 g/dL (ref 30.0–36.0)
MCV: 87.9 fL (ref 80.0–100.0)
Monocytes Absolute: 0.6 10*3/uL (ref 0.1–1.0)
Monocytes Relative: 9 %
Neutro Abs: 4.1 10*3/uL (ref 1.7–7.7)
Neutrophils Relative %: 61 %
Platelets: 472 10*3/uL — ABNORMAL HIGH (ref 150–400)
RBC: 3.38 MIL/uL — ABNORMAL LOW (ref 4.22–5.81)
RDW: 18.7 % — ABNORMAL HIGH (ref 11.5–15.5)
WBC: 6.6 10*3/uL (ref 4.0–10.5)
nRBC: 1.8 % — ABNORMAL HIGH (ref 0.0–0.2)

## 2019-08-13 LAB — COMPREHENSIVE METABOLIC PANEL
ALT: 13 U/L (ref 0–44)
AST: 31 U/L (ref 15–41)
Albumin: 4.5 g/dL (ref 3.5–5.0)
Alkaline Phosphatase: 65 U/L (ref 38–126)
Anion gap: 11 (ref 5–15)
BUN: <5 mg/dL — ABNORMAL LOW (ref 6–20)
CO2: 22 mmol/L (ref 22–32)
Calcium: 9.2 mg/dL (ref 8.9–10.3)
Chloride: 109 mmol/L (ref 98–111)
Creatinine, Ser: 0.7 mg/dL (ref 0.61–1.24)
GFR calc Af Amer: >60 mL/min (ref >60)
GFR calc non Af Amer: >60 mL/min (ref >60)
Glucose, Bld: 105 mg/dL — ABNORMAL HIGH (ref 70–99)
Potassium: 3.5 mmol/L (ref 3.5–5.1)
Sodium: 142 mmol/L (ref 135–145)
Total Bilirubin: 3.3 mg/dL — ABNORMAL HIGH (ref 0.3–1.2)
Total Protein: 7.5 g/dL (ref 6.5–8.1)

## 2019-08-13 MED ORDER — SODIUM CHLORIDE 0.9% FLUSH: 3.0000 mL | Freq: Once | INTRAVENOUS | Status: DC

## 2019-08-13 NOTE — Telephone Encounter (Signed)
Percocet and oxycotin

## 2019-08-13 NOTE — ED Notes (Addendum)
Called pt and asked if he was still in he ED pt did not answer when I called his name nor did I see him in waiting room or outside. When calling pt and asked if he was here pt stated no he was not in the ED anymore.

## 2019-08-13 NOTE — ED Notes (Signed)
Pt stated that he is not waiting anymore and has left the facilty. Pt was asked to try and waiting it out to be seen by a provider but was not waiting anymore.

## 2019-08-13 NOTE — ED Triage Notes (Signed)
Pt in with sickle cell crisis - c/o back pain that started last night. Took pain meds @ home, no relief. Denies any cp or sob

## 2019-08-14 ENCOUNTER — Telehealth: Payer: Self-pay | Admitting: Nurse Practitioner

## 2019-08-14 ENCOUNTER — Telehealth: Payer: Self-pay | Admitting: Family Medicine

## 2019-08-14 ENCOUNTER — Other Ambulatory Visit: Payer: Self-pay | Admitting: Family Medicine

## 2019-08-14 DIAGNOSIS — G894 Chronic pain syndrome: Secondary | ICD-10-CM

## 2019-08-14 DIAGNOSIS — D571 Sickle-cell disease without crisis: Secondary | ICD-10-CM

## 2019-08-14 DIAGNOSIS — Z79891 Long term (current) use of opiate analgesic: Secondary | ICD-10-CM

## 2019-08-14 MED ORDER — OXYCODONE-ACETAMINOPHEN 10-325 MG PO TABS: 1.0000 | ORAL_TABLET | Freq: Four times a day (QID) | ORAL | 0 refills | Status: DC | PRN

## 2019-08-14 MED ORDER — OXYCONTIN 20 MG PO T12A: 20.0000 mg | EXTENDED_RELEASE_TABLET | Freq: Two times a day (BID) | ORAL | 0 refills | Status: DC

## 2019-08-14 NOTE — Telephone Encounter (Signed)
DOne

## 2019-08-14 NOTE — Telephone Encounter (Signed)
Done

## 2019-08-22 ENCOUNTER — Ambulatory Visit: Payer: Self-pay | Admitting: Nurse Practitioner

## 2019-08-26 ENCOUNTER — Ambulatory Visit (INDEPENDENT_AMBULATORY_CARE_PROVIDER_SITE_OTHER): Payer: Medicaid Other | Admitting: Nurse Practitioner

## 2019-08-26 ENCOUNTER — Encounter: Payer: Self-pay | Admitting: Nurse Practitioner

## 2019-08-26 ENCOUNTER — Other Ambulatory Visit: Payer: Self-pay

## 2019-08-26 VITALS — BP 114/67 | HR 72 | Temp 98.2°F | Ht 74.0 in | Wt 120.0 lb

## 2019-08-26 DIAGNOSIS — R636 Underweight: Secondary | ICD-10-CM

## 2019-08-26 DIAGNOSIS — Z79891 Long term (current) use of opiate analgesic: Secondary | ICD-10-CM | POA: Diagnosis not present

## 2019-08-26 DIAGNOSIS — E440 Moderate protein-calorie malnutrition: Secondary | ICD-10-CM

## 2019-08-26 DIAGNOSIS — F1290 Cannabis use, unspecified, uncomplicated: Secondary | ICD-10-CM

## 2019-08-26 DIAGNOSIS — D571 Sickle-cell disease without crisis: Secondary | ICD-10-CM

## 2019-08-26 DIAGNOSIS — G894 Chronic pain syndrome: Secondary | ICD-10-CM

## 2019-08-26 LAB — POCT URINALYSIS DIPSTICK
Bilirubin, UA: NEGATIVE
Glucose, UA: NEGATIVE
Ketones, UA: NEGATIVE
Leukocytes, UA: NEGATIVE
Nitrite, UA: NEGATIVE
Protein, UA: POSITIVE — AB
Spec Grav, UA: 1.025 (ref 1.010–1.025)
Urobilinogen, UA: 1 E.U./dL
pH, UA: 7.5 (ref 5.0–8.0)

## 2019-08-26 MED ORDER — OXYCONTIN 20 MG PO T12A: 20.0000 mg | EXTENDED_RELEASE_TABLET | Freq: Two times a day (BID) | ORAL | 0 refills | Status: DC

## 2019-08-26 MED ORDER — OXYCODONE-ACETAMINOPHEN 10-325 MG PO TABS: 1.0000 | ORAL_TABLET | Freq: Four times a day (QID) | ORAL | 0 refills | Status: DC | PRN

## 2019-08-26 NOTE — Progress Notes (Signed)
[place], [place]  [address] Phone:  [phone]   Fax:  [phone]    Established Patient Office Visit  Subjective:  Patient ID: Timothy Marks, male    DOB: 02-01-1991  Age: 29 y.o. MRN: [id number]  CC:  Chief Complaint  Patient presents with  . Follow-up    medication refill, Percocet    HPI Timothy Marks presents for follow up. 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   He is in today for follow-up related to his sickle cell disease.  He has had one ED visit that led to admission on 07/08/2019.  On 08/13/2019 he presented to the emergency room however left before being seen.  He has 6/10 sharp lower back pain that does radiates. Denies fever, headache, cough, wheezing, shortness of breath, chest pains, abdominal pain. Denies any open wounds, skin irritation. He does work part/full time .   Past Medical History:  Diagnosis Date  . Depression   . Generalized weakness 03/31/2019  . 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 Sickle cell anemia (HCC)   . Vitamin D deficiency y-6    Past Surgical History:  Procedure Laterality Date  . CHOLECYSTECTOMY      Family History  Problem Relation Age of Onset  . Sickle cell trait Mother   . Sickle cell trait Father     Social History   Socioeconomic History  . Marital status: Single    Spouse name: Not on file  . Number of children: Not on file  . Years of education: Not on file  . Highest education level: Not on file  Occupational History  . Not on file  Tobacco Use  . Smoking status: Never Smoker  . Smokeless tobacco: Never Used  Vaping Use  . Vaping Use: Never used  Substance and Sexual Activity  . Alcohol use: No  . Drug use: Not Currently     Types: Marijuana  . Sexual activity: Not on file  Other Topics Concern  . Not on file  Social History Narrative  . Not on file   Social Determinants of Health   Financial Resource Strain:   . Difficulty of Paying Living Expenses:   Food Insecurity:   . Worried About Charity fundraiser in the Last Year:   . Arboriculturist in the Last Year:   Transportation Needs:   . Film/video editor (Medical):   Marland Kitchen Lack of Transportation (Non-Medical):   Physical Activity:   . Days of Exercise per Week:   . Minutes of Exercise per Session:   Stress:   . Feeling of Stress :   Social Connections:   . Frequency of Communication with Friends and Family:   . Frequency of Social Gatherings with Friends and Family:   . Attends Religious Services:   . Active Member of Clubs or Organizations:   . Attends Archivist Meetings:   Marland Kitchen Marital Status:   Intimate Partner Violence:   . Fear of Current or Ex-Partner:   . Emotionally Abused:   Marland Kitchen Physically Abused:   . Sexually Abused:     Outpatient Medications Prior to Visit  Medication Sig Dispense Refill  . cholecalciferol (VITAMIN D) 1000 units tablet Take 2 tablets (2,000 Units total) by  mouth daily. 30 tablet 6  . folic acid (FOLVITE) 1 MG tablet Take 1 tablet (1 mg total) by mouth daily. 90 tablet 2  . gabapentin (NEURONTIN) 300 MG capsule Take 1 capsule (300 mg total) by mouth 3 (three) times daily. 90 capsule 5  . hydroxyurea (HYDREA) 500 MG capsule Take 1 capsule (500 mg total) by mouth 2 (two) times daily. May take with food to minimize GI side effects. 180 capsule 2  . ibuprofen (ADVIL) 800 MG tablet Take 1 tablet (800 mg total) by mouth 3 (three) times daily as needed (pain). 30 tablet 0  . Nutritional Supplements (ENSURE ACTIVE HIGH PROTEIN) LIQD Take 1 each by mouth 3 (three) times daily between meals. #90 cans of Ensure 237 mL 11  . oxyCODONE-acetaminophen (PERCOCET) 10-325 MG tablet Take 1 tablet by mouth every 6 (six) hours  as needed for up to 15 days for pain. 60 tablet 0  . OXYCONTIN 20 MG 12 hr tablet Take 1 tablet (20 mg total) by mouth every 12 (twelve) hours. 60 tablet 0   No facility-administered medications prior to visit.    Allergies  Allergen Reactions  . Buprenorphine Hcl Hives  . Levaquin [Levofloxacin] Itching  . Meperidine Rash  . Morphine Hives  . Morphine And Related Hives  . Toradol [Ketorolac Tromethamine] Itching  . Tramadol Hives  . Vancomycin Itching  . Zosyn [Piperacillin Sod-Tazobactam So] Itching and Rash    Has taken rocephin in past    ROS Review of Systems  All other systems reviewed and are negative.     Objective:    Physical Exam Constitutional:      Comments: Under weight   HENT:     Head: Normocephalic.     Nose: Nose normal.     Mouth/Throat:     Mouth: Mucous membranes are moist.  Cardiovascular:     Rate and Rhythm: Normal rate and regular rhythm.     Pulses: Normal pulses.     Heart sounds: Normal heart sounds.  Pulmonary:     Effort: Pulmonary effort is normal.     Breath sounds: Normal breath sounds.  Abdominal:     General: Abdomen is flat.     Palpations: Abdomen is soft.     Comments: New tattoo abdomen sore   Musculoskeletal:        General: Normal range of motion.     Cervical back: Normal range of motion.  Skin:    General: Skin is warm and dry.     Capillary Refill: Capillary refill takes less than 2 seconds.  Neurological:     General: No focal deficit present.     Mental Status: He is alert and oriented to person, place, and time.  Psychiatric:        Mood and Affect: Mood normal.        Thought Content: Thought content normal.        Judgment: Judgment normal.     BP 114/67   Pulse 72   Temp 98.2 F (36.8 C)   Ht 6\' 2"  (1.88 m)   Wt 120 lb (54.4 kg)   SpO2 100%   BMI 15.41 kg/m  Wt Readings from Last 3 Encounters:  08/26/19 120 lb (54.4 kg)  08/13/19 144 lb 13.5 oz (65.7 kg)  07/08/19 135 lb (61.2 kg)      There are no preventive care reminders to display for this patient.  There are no preventive care reminders to display for this patient.  Lab Results  Component Value Date   TSH 0.884 08/31/2018   Lab Results  Component Value Date   WBC 6.6 08/13/2019   HGB 10.2 (L) 08/13/2019   HCT 29.7 (L) 08/13/2019   MCV 87.9 08/13/2019   PLT 472 (H) 08/13/2019   Lab Results  Component Value Date   NA 142 08/13/2019   K 3.5 08/13/2019   CO2 22 08/13/2019   GLUCOSE 105 (H) 08/13/2019   BUN <5 (L) 08/13/2019   CREATININE 0.70 08/13/2019   BILITOT 3.3 (H) 08/13/2019   ALKPHOS 65 08/13/2019   AST 31 08/13/2019   ALT 13 08/13/2019   PROT 7.5 08/13/2019   ALBUMIN 4.5 08/13/2019   CALCIUM 9.2 08/13/2019   ANIONGAP 11 08/13/2019   No results found for: CHOL No results found for: HDL No results found for: LDLCALC No results found for: TRIG No results found for: CHOLHDL No results found for: HGBA1C    Assessment & Plan:   Problem List Items Addressed This Visit      Other   Underweight Weight is down 1 pound since last office visit.  Discussed increasing Ensure and given different strategies .    Chronic pain syndrome   Relevant Medications   oxyCODONE-acetaminophen (PERCOCET) 10-325 MG tablet (Start on 08/29/2019)   OXYCONTIN 20 MG 12 hr tablet (Start on 09/14/2019)   Other Relevant Orders   [id number] 11+Oxyco+Alc+Crt-Bund Long discussion with patient and current prescription narcotics and the legal substances i.e. marijuana.  Discussed the concerns of overdose.  Patient verbalized understanding   Sickle cell anemia (HCC) - Primary (Chronic)   Relevant Medications   oxyCODONE-acetaminophen (PERCOCET) 10-325 MG tablet (Start on 08/29/2019)   Other Relevant Orders   Urinalysis Dipstick (Completed)   [id number] 11+Oxyco+Alc+Crt-Bund    Other Visit Diagnoses    Long term current use of opiate analgesic       Relevant Medications   OXYCONTIN 20 MG 12 hr tablet (Start on  09/14/2019)   Other Relevant Orders   [id number] 11+Oxyco+Alc+Crt-Bund   Marijuana use       Moderate protein-calorie malnutrition (HCC)   (Chronic)        Meds ordered this encounter  Medications  . oxyCODONE-acetaminophen (PERCOCET) 10-325 MG tablet    Sig: Take 1 tablet by mouth every 6 (six) hours as needed for up to 15 days for pain.    Dispense:  60 tablet    Refill:  0    May fill on 08/29/19    Order Specific Question:   Supervising Provider    Answer:   Tresa Garter [[id number]]  . OXYCONTIN 20 MG 12 hr tablet    Sig: Take 1 tablet (20 mg total) by mouth every 12 (twelve) hours.    Dispense:  60 tablet    Refill:  0    Please do not refill this prior to 09/14/2019. Thank you.    Order Specific Question:   Supervising Provider    Answer:   Tresa Garter [[id number]]    Follow-up: Return in about 2 months (around 10/27/2019).    Vevelyn Francois, NP

## 2019-08-26 NOTE — Patient Instructions (Signed)
Healthy Eating Following a healthy eating pattern may help you to achieve and maintain a healthy body weight, reduce the risk of chronic disease, and live a long and productive life. It is important to follow a healthy eating pattern at an appropriate calorie level for your body. Your nutritional needs should be met primarily through food by choosing a variety of nutrient-rich foods. What are tips for following this plan? Reading food labels  Read labels and choose the following: ? Reduced or low sodium. ? Juices with 100% fruit juice. ? Foods with low saturated fats and high polyunsaturated and monounsaturated fats. ? Foods with whole grains, such as whole wheat, cracked wheat, brown rice, and wild rice. ? Whole grains that are fortified with folic acid. This is recommended for women who are pregnant or who want to become pregnant.  Read labels and avoid the following: ? Foods with a lot of added sugars. These include foods that contain brown sugar, corn sweetener, corn syrup, dextrose, fructose, glucose, high-fructose corn syrup, honey, invert sugar, lactose, malt syrup, maltose, molasses, raw sugar, sucrose, trehalose, or turbinado sugar.  Do not eat more than the following amounts of added sugar per day:  6 teaspoons (25 g) for women.  9 teaspoons (38 g) for men. ? Foods that contain processed or refined starches and grains. ? Refined grain products, such as white flour, degermed cornmeal, white bread, and white rice. Shopping  Choose nutrient-rich snacks, such as vegetables, whole fruits, and nuts. Avoid high-calorie and high-sugar snacks, such as potato chips, fruit snacks, and candy.  Use oil-based dressings and spreads on foods instead of solid fats such as butter, stick margarine, or cream cheese.  Limit pre-made sauces, mixes, and "instant" products such as flavored rice, instant noodles, and ready-made pasta.  Try more plant-protein sources, such as tofu, tempeh, black beans,  edamame, lentils, nuts, and seeds.  Explore eating plans such as the Mediterranean diet or vegetarian diet. Cooking  Use oil to saut or stir-fry foods instead of solid fats such as butter, stick margarine, or lard.  Try baking, boiling, grilling, or broiling instead of frying.  Remove the fatty part of meats before cooking.  Steam vegetables in water or broth. Meal planning   At meals, imagine dividing your plate into fourths: ? One-half of your plate is fruits and vegetables. ? One-fourth of your plate is whole grains. ? One-fourth of your plate is protein, especially lean meats, poultry, eggs, tofu, beans, or nuts.  Include low-fat dairy as part of your daily diet. Lifestyle  Choose healthy options in all settings, including home, work, school, restaurants, or stores.  Prepare your food safely: ? Wash your hands after handling raw meats. ? Keep food preparation surfaces clean by regularly washing with hot, soapy water. ? Keep raw meats separate from ready-to-eat foods, such as fruits and vegetables. ? Cook seafood, meat, poultry, and eggs to the recommended internal temperature. ? Store foods at safe temperatures. In general:  Keep cold foods at 59F (4.4C) or below.  Keep hot foods at 159F (60C) or above.  Keep your freezer at [facility] (-17.8C) or below.  Foods are no longer safe to eat when they have been between the temperatures of 40-159F (4.4-60C) for more than 2 hours. What foods should I eat? Fruits Aim to eat 2 cup-equivalents of fresh, canned (in natural juice), or frozen fruits each day. Examples of 1 cup-equivalent of fruit include 1 small apple, 8 large strawberries, 1 cup canned fruit,  cup  dried fruit, or 1 cup 100% juice. Vegetables Aim to eat 2-3 cup-equivalents of fresh and frozen vegetables each day, including different varieties and colors. Examples of 1 cup-equivalent of vegetables include 2 medium carrots, 2 cups raw, leafy greens, 1 cup chopped  vegetable (raw or cooked), or 1 medium baked potato. Grains Aim to eat 6 ounce-equivalents of whole grains each day. Examples of 1 ounce-equivalent of grains include 1 slice of bread, 1 cup ready-to-eat cereal, 3 cups popcorn, or  cup cooked rice, pasta, or cereal. Meats and other proteins Aim to eat 5-6 ounce-equivalents of protein each day. Examples of 1 ounce-equivalent of protein include 1 egg, 1/2 cup nuts or seeds, or 1 tablespoon (16 g) peanut butter. A cut of meat or fish that is the size of a deck of cards is about 3-4 ounce-equivalents.  Of the protein you eat each week, try to have at least 8 ounces come from seafood. This includes salmon, trout, herring, and anchovies. Dairy Aim to eat 3 cup-equivalents of fat-free or low-fat dairy each day. Examples of 1 cup-equivalent of dairy include 1 cup (240 mL) milk, 8 ounces (250 g) yogurt, 1 ounces (44 g) natural cheese, or 1 cup (240 mL) fortified soy milk. Fats and oils  Aim for about 5 teaspoons (21 g) per day. Choose monounsaturated fats, such as canola and olive oils, avocados, peanut butter, and most nuts, or polyunsaturated fats, such as sunflower, corn, and soybean oils, walnuts, pine nuts, sesame seeds, sunflower seeds, and flaxseed. Beverages  Aim for six 8-oz glasses of water per day. Limit coffee to three to five 8-oz cups per day.  Limit caffeinated beverages that have added calories, such as soda and energy drinks.  Limit alcohol intake to no more than 1 drink a day for nonpregnant women and 2 drinks a day for men. One drink equals 12 oz of beer (355 mL), 5 oz of wine (148 mL), or 1 oz of hard liquor (44 mL). Seasoning and other foods  Avoid adding excess amounts of salt to your foods. Try flavoring foods with herbs and spices instead of salt.  Avoid adding sugar to foods.  Try using oil-based dressings, sauces, and spreads instead of solid fats. This information is based on general [place] nutrition guidelines. For more  information, visit [employer]. Exact amounts may vary based on your nutrition needs. Summary  A healthy eating plan may help you to maintain a healthy weight, reduce the risk of chronic diseases, and stay active throughout your life.  Plan your meals. Make sure you eat the right portions of a variety of nutrient-rich foods.  Try baking, boiling, grilling, or broiling instead of frying.  Choose healthy options in all settings, including home, work, school, restaurants, or stores. This information is not intended to replace advice given to you by your health care provider. Make sure you discuss any questions you have with your health care provider. Document Revised: 05/15/2017 Document Reviewed: 05/15/2017 Elsevier Patient Education  [place].

## 2019-08-30 ENCOUNTER — Emergency Department (HOSPITAL_COMMUNITY): Payer: Medicaid Other

## 2019-08-30 ENCOUNTER — Telehealth (HOSPITAL_COMMUNITY): Payer: Self-pay | Admitting: *Deleted

## 2019-08-30 ENCOUNTER — Emergency Department (HOSPITAL_COMMUNITY)
Admission: EM | Admit: 2019-08-30 | Discharge: 2019-08-30 | Disposition: A | Discharge status: Home / Self Care | Payer: Medicaid Other | Attending: Emergency Medicine | Admitting: Emergency Medicine

## 2019-08-30 ENCOUNTER — Other Ambulatory Visit: Payer: Self-pay

## 2019-08-30 ENCOUNTER — Emergency Department (HOSPITAL_COMMUNITY)
Admission: EM | Admit: 2019-08-30 | Discharge: 2019-08-30 | Disposition: A | Discharge status: Home / Self Care | Payer: Medicaid Other | Source: Home / Self Care | Attending: Emergency Medicine | Admitting: Emergency Medicine

## 2019-08-30 ENCOUNTER — Encounter (HOSPITAL_COMMUNITY): Payer: Self-pay

## 2019-08-30 DIAGNOSIS — Z20822 Contact with and (suspected) exposure to covid-19: Secondary | ICD-10-CM | POA: Insufficient documentation

## 2019-08-30 DIAGNOSIS — R079 Chest pain, unspecified: Secondary | ICD-10-CM | POA: Insufficient documentation

## 2019-08-30 DIAGNOSIS — D57219 Sickle-cell/Hb-C disease with crisis, unspecified: Secondary | ICD-10-CM | POA: Insufficient documentation

## 2019-08-30 DIAGNOSIS — M549 Dorsalgia, unspecified: Secondary | ICD-10-CM | POA: Insufficient documentation

## 2019-08-30 DIAGNOSIS — D5700 Hb-SS disease with crisis, unspecified: Secondary | ICD-10-CM

## 2019-08-30 LAB — CBC WITH DIFFERENTIAL/PLATELET
Abs Immature Granulocytes: 0.09 10*3/uL — ABNORMAL HIGH (ref 0.00–0.07)
Basophils Absolute: 0 10*3/uL (ref 0.0–0.1)
Basophils Relative: 0 %
Eosinophils Absolute: 0.2 10*3/uL (ref 0.0–0.5)
Eosinophils Relative: 1 %
HCT: 27.7 % — ABNORMAL LOW (ref 39.0–52.0)
Hemoglobin: 9.5 g/dL — ABNORMAL LOW (ref 13.0–17.0)
Immature Granulocytes: 1 %
Lymphocytes Relative: 10 %
Lymphs Abs: 1.6 10*3/uL (ref 0.7–4.0)
MCH: 29.6 pg (ref 26.0–34.0)
MCHC: 34.3 g/dL (ref 30.0–36.0)
MCV: 86.3 fL (ref 80.0–100.0)
Monocytes Absolute: 1.2 10*3/uL — ABNORMAL HIGH (ref 0.1–1.0)
Monocytes Relative: 8 %
Neutro Abs: 12.4 10*3/uL — ABNORMAL HIGH (ref 1.7–7.7)
Neutrophils Relative %: 80 %
Platelets: 489 10*3/uL — ABNORMAL HIGH (ref 150–400)
RBC: 3.21 MIL/uL — ABNORMAL LOW (ref 4.22–5.81)
RDW: 16.7 % — ABNORMAL HIGH (ref 11.5–15.5)
WBC: 15.5 10*3/uL — ABNORMAL HIGH (ref 4.0–10.5)
nRBC: 0.8 % — ABNORMAL HIGH (ref 0.0–0.2)

## 2019-08-30 LAB — COMPREHENSIVE METABOLIC PANEL
ALT: 11 U/L (ref 0–44)
AST: 26 U/L (ref 15–41)
Albumin: 4.2 g/dL (ref 3.5–5.0)
Alkaline Phosphatase: 67 U/L (ref 38–126)
Anion gap: 9 (ref 5–15)
BUN: <5 mg/dL — ABNORMAL LOW (ref 6–20)
CO2: 23 mmol/L (ref 22–32)
Calcium: 9 mg/dL (ref 8.9–10.3)
Chloride: 105 mmol/L (ref 98–111)
Creatinine, Ser: 0.64 mg/dL (ref 0.61–1.24)
GFR calc Af Amer: >60 mL/min (ref >60)
GFR calc non Af Amer: >60 mL/min (ref >60)
Glucose, Bld: 113 mg/dL — ABNORMAL HIGH (ref 70–99)
Potassium: 3.6 mmol/L (ref 3.5–5.1)
Sodium: 137 mmol/L (ref 135–145)
Total Bilirubin: 2.6 mg/dL — ABNORMAL HIGH (ref 0.3–1.2)
Total Protein: 7.3 g/dL (ref 6.5–8.1)

## 2019-08-30 LAB — RETICULOCYTES
Immature Retic Fract: 32.2 % — ABNORMAL HIGH (ref 2.3–15.9)
RBC.: 3.19 MIL/uL — ABNORMAL LOW (ref 4.22–5.81)
Retic Count, Absolute: 253.6 10*3/uL — ABNORMAL HIGH (ref 19.0–186.0)
Retic Ct Pct: 8 % — ABNORMAL HIGH (ref 0.4–3.1)

## 2019-08-30 LAB — SARS CORONAVIRUS 2 BY RT PCR (HOSPITAL ORDER, PERFORMED IN ~~LOC~~ HOSPITAL LAB): SARS Coronavirus 2: NEGATIVE

## 2019-08-30 MED ORDER — DIPHENHYDRAMINE HCL 50 MG/ML IJ SOLN
25.0000 mg | Freq: Once | INTRAMUSCULAR | Status: AC
  Administered 2019-08-30: 25 mg via INTRAVENOUS
  Filled 2019-08-30: qty 1

## 2019-08-30 MED ORDER — HYDROMORPHONE HCL 1 MG/ML IJ SOLN
2.0000 mg | Freq: Once | INTRAMUSCULAR | Status: AC | PRN
  Administered 2019-08-30: 2 mg via INTRAVENOUS
  Filled 2019-08-30: qty 2

## 2019-08-30 MED ORDER — SODIUM CHLORIDE 0.9% FLUSH
3.0000 mL | Freq: Once | INTRAVENOUS | Status: AC
  Administered 2019-08-30: 3 mL via INTRAVENOUS

## 2019-08-30 MED ORDER — SODIUM CHLORIDE 0.9% FLUSH: 3.0000 mL | Freq: Once | INTRAVENOUS | Status: DC

## 2019-08-30 MED ORDER — SODIUM CHLORIDE 0.45 % IV SOLN: INTRAVENOUS | Status: DC

## 2019-08-30 MED ORDER — HYDROMORPHONE HCL 1 MG/ML IJ SOLN
2.0000 mg | INTRAMUSCULAR | Status: AC
  Administered 2019-08-30: 2 mg via INTRAVENOUS
  Filled 2019-08-30: qty 2

## 2019-08-30 MED ORDER — HYDROMORPHONE HCL 1 MG/ML IJ SOLN
2.0000 mg | Freq: Once | INTRAMUSCULAR | Status: AC
  Administered 2019-08-30: 2 mg via INTRAVENOUS
  Filled 2019-08-30: qty 2

## 2019-08-30 NOTE — ED Triage Notes (Signed)
Pt arrives to ED via [facility] w/ c/o sickle cell pain of the chest, legs, lower back, and shoulders. Pt reports 10/10 pain. EMS VSS. Pt received tylenol and percocet 1 hour ago. Pt went to [facility] this [facility] but [facility].

## 2019-08-30 NOTE — Telephone Encounter (Signed)
Patient called requesting to come to the day hospital for sickle cell pain. Patient reports bilateral leg, back and chest pain rated 8/10. Reports taking Percocet at 12:00 midnight. COVID-19 screening done and patient denies all symptoms and exposures. Denies fever, nausea, vomiting, diarrhea and priapism. [place], Lydia notified and advised that patient continue to take pain medications around the clock. Patient is to alternated Ibuprofen and Tylenol. Patient advised and expressed concern for chest pain. Patient advised to go to the ED for evaluation of chest pain. Patient expresses an understanding.

## 2019-08-30 NOTE — ED Provider Notes (Signed)
[facility] EMERGENCY DEPARTMENT Provider Note   CSN: [id number] Arrival date & time: 08/30/19  1639     History Chief Complaint  Patient presents with  . Sickle Cell Pain Crisis    Burech Mcfarland is a 29 y.o. male history of sickle cell, chronic pain presenting with chest pain, back pain. Patient states that earlier today, he has back pain and also had chest pain.  Patient states that this is typical of his pain crisis.  Patient denies any fevers or shortness of breath.  Patient took his Percocet with no relief.  Patient went to [facility] long earlier but left without being seen due to wait times.  Patient was admitted in May for sickle cell crisis.   The history is provided by the patient.       Past Medical History:  Diagnosis Date  . Depression   . Generalized weakness 03/31/2019  . 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 Sickle cell anemia ([facility])   . Vitamin D deficiency y-6    Patient Active Problem List   Diagnosis Date Noted  . Underweight 06/14/2019  . Generalized weakness 03/31/2019  . Sickle-cell crisis ([place]) 03/24/2019  . SIRS (systemic inflammatory response syndrome) ([place]) 02/10/2019  . Abnormal liver function 11/04/2018  . Sickle cell anemia with crisis ([place]) 05/22/2018  . Acute bilateral low back pain without sciatica   . Other chronic pain   . Anemia   . 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 Physical deconditioning   . Agitation   . Chronic pain syndrome   . Recurrent cold sores   . Fever   . Pain   . Sickle cell crisis ([place]) 07/16/2017  . Adjustment disorder with mixed disturbance of emotions and conduct   . Constipation 08/03/2014  . Leukocytosis 06/30/2014  . 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 Protein-calorie malnutrition, severe ([place]) 04/09/2014  . Major depressive disorder, recurrent, severe without psychotic features ([place])   . 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 Vitamin D deficiency   . Sickle cell anemia ([place]) 03/30/2014  . Hyperbilirubinemia 03/30/2014  . Hypokalemia 02/07/2014  . Aggressive behavior 01/25/2013  . Sickle cell anemia with pain ([place]) 01/24/2013  . Sickle cell pain crisis ([place]) 01/24/2013    Past Surgical History:  Procedure Laterality Date  . CHOLECYSTECTOMY         Family History  Problem Relation Age of Onset  . Sickle cell trait Mother   . Sickle cell trait Father     Social History   Tobacco Use  . Smoking status: Never Smoker  . Smokeless tobacco: Never Used  Vaping Use  . Vaping Use: Never used  Substance Use Topics  . Alcohol use: No  . Drug use: Not Currently    Types: Marijuana    Home Medications Prior to Admission medications   Medication Sig Start Date End Date Taking? Authorizing Provider  cholecalciferol (VITAMIN D) 1000 units tablet Take 2 tablets (2,000 Units total) by mouth daily. 12/13/17   Azzie Glatter, FNP  folic acid (FOLVITE) 1 MG tablet Take 1 tablet (1 mg total) by mouth daily. 05/01/17   Dorena Dew, FNP  gabapentin (NEURONTIN) 300 MG capsule Take 1 capsule (300 mg total) by mouth 3 (three) times daily. 03/12/19   Dorena Dew, FNP  hydroxyurea (HYDREA) 500 MG capsule Take 1 capsule (500 mg total) by mouth 2 (two) times daily. May  take with food to minimize GI side effects. 05/01/17   Dorena Dew, FNP  ibuprofen (ADVIL) 800 MG tablet Take 1 tablet (800 mg total) by mouth 3 (three) times daily as needed (pain). 03/12/19   Dorena Dew, FNP  Nutritional Supplements (ENSURE ACTIVE HIGH PROTEIN) LIQD Take 1 each by mouth 3 (three) times daily between meals. #90 cans of Ensure 11/13/18   Dorena Dew, FNP  oxyCODONE-acetaminophen (PERCOCET) 10-325 MG tablet Take 1 tablet by mouth every 6 (six) hours as needed for up to 15 days for pain. 08/29/19 09/13/19  Vevelyn Francois, NP  OXYCONTIN 20 MG 12 hr tablet Take 1 tablet (20 mg total) by mouth every 12 (twelve)  hours. 09/14/19 10/14/19  Vevelyn Francois, NP    Allergies    Buprenorphine hcl, Levaquin [levofloxacin], Meperidine, Morphine, Morphine and related, Toradol [ketorolac tromethamine], Tramadol, Vancomycin, and Zosyn [piperacillin sod-tazobactam so]  Review of Systems   Review of Systems  Cardiovascular: Positive for chest pain.  Musculoskeletal: Positive for back pain.  All other systems reviewed and are negative.   Physical Exam Updated Vital Signs BP 112/72   Pulse 90   Temp 98.2 F (36.8 C) (Oral)   Resp 16   SpO2 100%   Physical Exam Vitals and nursing note reviewed.  Constitutional:      Comments: Uncomfortable  HENT:     Head: Normocephalic.     Nose: Nose normal.     Mouth/Throat:     Mouth: Mucous membranes are moist.  Eyes:     Extraocular Movements: Extraocular movements intact.     Pupils: Pupils are equal, round, and reactive to light.  Cardiovascular:     Rate and Rhythm: Normal rate and regular rhythm.     Pulses: Normal pulses.     Heart sounds: Normal heart sounds.  Pulmonary:     Effort: Pulmonary effort is normal.     Breath sounds: Normal breath sounds.  Abdominal:     General: Abdomen is flat.     Palpations: Abdomen is soft.  Musculoskeletal:        General: Normal range of motion.     Cervical back: Normal range of motion.     Comments: No bony tenderness   Skin:    General: Skin is warm.     Capillary Refill: Capillary refill takes less than 2 seconds.  Neurological:     General: No focal deficit present.     Mental Status: He is alert and oriented to person, place, and time.  Psychiatric:        Mood and Affect: Mood normal.     ED Results / Procedures / Treatments   Labs (all labs ordered are listed, but only abnormal results are displayed) Labs Reviewed  COMPREHENSIVE METABOLIC PANEL - Abnormal; Notable for the following components:      Result Value   Glucose, Bld 113 (*)    BUN <5 (*)    Total Bilirubin 2.6 (*)    All other  components within normal limits  CBC WITH DIFFERENTIAL/PLATELET - Abnormal; Notable for the following components:   WBC 15.5 (*)    RBC 3.21 (*)    Hemoglobin 9.5 (*)    HCT 27.7 (*)    RDW 16.7 (*)    Platelets 489 (*)    nRBC 0.8 (*)    Neutro Abs 12.4 (*)    Monocytes Absolute 1.2 (*)    Abs Immature Granulocytes 0.09 (*)  All other components within normal limits  RETICULOCYTES - Abnormal; Notable for the following components:   Retic Ct Pct 8.0 (*)    RBC. 3.19 (*)    Retic Count, Absolute 253.6 (*)    Immature Retic Fract 32.2 (*)    All other components within normal limits  SARS CORONAVIRUS 2 BY RT PCR [facility] ORDER, [facility] LAB)    EKG EKG Interpretation  Date/Time:  Friday August 30 2019 18:29:23 EDT Ventricular Rate:  89 PR Interval:  134 QRS Duration: 94 QT Interval:  326 QTC Calculation: 396 R Axis:   77 Text Interpretation: Normal sinus rhythm Normal ECG No significant change since last tracing Confirmed by Wandra Arthurs [phone]) on 08/30/2019 7:12:04 PM   Radiology DG Chest 2 View  Result Date: 08/30/2019 CLINICAL DATA:  Chest pain in a patient with a history of sickle cell disease. EXAM: CHEST - 2 VIEW COMPARISON:  PA and lateral chest 05/30/2019. FINDINGS: Lungs clear. Heart size normal. No pneumothorax or pleural effusion. No acute bony abnormality. Bony changes of sickle cell disease noted. IMPRESSION: No acute disease. Electronically Signed   By: Inge Rise M.D.   On: 08/30/2019 09:41    Procedures Procedures (including critical care time)  Medications Ordered in ED Medications  sodium chloride flush (NS) 0.9 % injection 3 mL (has no administration in time range)  HYDROmorphone (DILAUDID) injection 2 mg (has no administration in time range)  HYDROmorphone (DILAUDID) injection 2 mg (has no administration in time range)  diphenhydrAMINE (BENADRYL) injection 25 mg (has no administration in time range)  0.45 % sodium  chloride infusion (has no administration in time range)    ED Course  I have reviewed the triage vital signs and the nursing notes.  Pertinent labs & imaging results that were available during my care of the patient were reviewed by me and considered in my medical decision making (see chart for details).    MDM Rules/Calculators/A&P                          Vash Quezada is a 29 y.o. male presenting with chest pain and back pain.  I think likely sickle cell crisis.  Plan to get CBC and reticulocyte count.  We will also give pain medicine as per protocol.   10:08 PM Patient's labs showed hemoglobin 9.5 and reticulocyte count is elevated as appropriate.  Given Dilaudid for pain and pain is controlled.  After admission for pain control but he states that his pain is better now.  Patient wants to go home.  Final Clinical Impression(s) / ED Diagnoses Final diagnoses:  None    Rx / DC Orders ED Discharge Orders    None       Drenda Freeze, MD 08/30/19 2209

## 2019-08-30 NOTE — ED Notes (Signed)
Pt sts he cant wait any longer, pt called for his ride and has left

## 2019-08-30 NOTE — ED Triage Notes (Signed)
Patient c/o sickle cell pain of the chest, legs, lower back, and shoulders since yesterday.

## 2019-08-30 NOTE — Discharge Instructions (Signed)
Continue taking your pain medicine as prescribed.  Sickle cell pain doctor.  Return to ER if you have worse chest pain, shortness of breath, back pain.

## 2019-09-02 ENCOUNTER — Encounter (HOSPITAL_COMMUNITY): Payer: Self-pay | Admitting: Family Medicine

## 2019-09-02 ENCOUNTER — Telehealth (HOSPITAL_COMMUNITY): Payer: Self-pay | Admitting: *Deleted

## 2019-09-02 ENCOUNTER — Non-Acute Institutional Stay (HOSPITAL_COMMUNITY)
Admission: AD | Admit: 2019-09-02 | Discharge: 2019-09-02 | Disposition: A | Discharge status: Home / Self Care | Payer: Medicaid Other | Source: Ambulatory Visit | Attending: Internal Medicine | Admitting: Internal Medicine

## 2019-09-02 DIAGNOSIS — Z791 Long term (current) use of non-steroidal anti-inflammatories (NSAID): Secondary | ICD-10-CM | POA: Insufficient documentation

## 2019-09-02 DIAGNOSIS — Z885 Allergy status to narcotic agent status: Secondary | ICD-10-CM | POA: Diagnosis not present

## 2019-09-02 DIAGNOSIS — F1120 Opioid dependence, uncomplicated: Secondary | ICD-10-CM | POA: Diagnosis not present

## 2019-09-02 DIAGNOSIS — Z880 Allergy status to penicillin: Secondary | ICD-10-CM | POA: Diagnosis not present

## 2019-09-02 DIAGNOSIS — Z881 Allergy status to other antibiotic agents status: Secondary | ICD-10-CM | POA: Diagnosis not present

## 2019-09-02 DIAGNOSIS — D638 Anemia in other chronic diseases classified elsewhere: Secondary | ICD-10-CM | POA: Insufficient documentation

## 2019-09-02 DIAGNOSIS — F329 Major depressive disorder, single episode, unspecified: Secondary | ICD-10-CM | POA: Diagnosis not present

## 2019-09-02 DIAGNOSIS — D5700 Hb-SS disease with crisis, unspecified: Secondary | ICD-10-CM | POA: Diagnosis present

## 2019-09-02 DIAGNOSIS — Z888 Allergy status to other drugs, medicaments and biological substances status: Secondary | ICD-10-CM | POA: Insufficient documentation

## 2019-09-02 DIAGNOSIS — E559 Vitamin D deficiency, unspecified: Secondary | ICD-10-CM | POA: Diagnosis not present

## 2019-09-02 DIAGNOSIS — Z79899 Other long term (current) drug therapy: Secondary | ICD-10-CM | POA: Insufficient documentation

## 2019-09-02 DIAGNOSIS — G894 Chronic pain syndrome: Secondary | ICD-10-CM | POA: Insufficient documentation

## 2019-09-02 DIAGNOSIS — Z832 Family history of diseases of the blood and blood-forming organs and certain disorders involving the immune mechanism: Secondary | ICD-10-CM | POA: Insufficient documentation

## 2019-09-02 LAB — RETICULOCYTES
Immature Retic Fract: 30.4 % — ABNORMAL HIGH (ref 2.3–15.9)
RBC.: 2.87 MIL/uL — ABNORMAL LOW (ref 4.22–5.81)
Retic Count, Absolute: 231 10*3/uL — ABNORMAL HIGH (ref 19.0–186.0)
Retic Ct Pct: 8.1 % — ABNORMAL HIGH (ref 0.4–3.1)

## 2019-09-02 LAB — CBC WITH DIFFERENTIAL/PLATELET
Abs Immature Granulocytes: 0.07 10*3/uL (ref 0.00–0.07)
Basophils Absolute: 0 10*3/uL (ref 0.0–0.1)
Basophils Relative: 0 %
Eosinophils Absolute: 0 10*3/uL (ref 0.0–0.5)
Eosinophils Relative: 0 %
HCT: 25 % — ABNORMAL LOW (ref 39.0–52.0)
Hemoglobin: 8.6 g/dL — ABNORMAL LOW (ref 13.0–17.0)
Immature Granulocytes: 1 %
Lymphocytes Relative: 9 %
Lymphs Abs: 1.4 10*3/uL (ref 0.7–4.0)
MCH: 29.6 pg (ref 26.0–34.0)
MCHC: 34.4 g/dL (ref 30.0–36.0)
MCV: 85.9 fL (ref 80.0–100.0)
Monocytes Absolute: 1.2 10*3/uL — ABNORMAL HIGH (ref 0.1–1.0)
Monocytes Relative: 8 %
Neutro Abs: 12.6 10*3/uL — ABNORMAL HIGH (ref 1.7–7.7)
Neutrophils Relative %: 82 %
Platelets: 421 10*3/uL — ABNORMAL HIGH (ref 150–400)
RBC: 2.91 MIL/uL — ABNORMAL LOW (ref 4.22–5.81)
RDW: 16.2 % — ABNORMAL HIGH (ref 11.5–15.5)
WBC: 15.3 10*3/uL — ABNORMAL HIGH (ref 4.0–10.5)
nRBC: 0.5 % — ABNORMAL HIGH (ref 0.0–0.2)

## 2019-09-02 LAB — COMPREHENSIVE METABOLIC PANEL
ALT: 13 U/L (ref 0–44)
AST: 19 U/L (ref 15–41)
Albumin: 4.3 g/dL (ref 3.5–5.0)
Alkaline Phosphatase: 73 U/L (ref 38–126)
Anion gap: 12 (ref 5–15)
BUN: 13 mg/dL (ref 6–20)
CO2: 26 mmol/L (ref 22–32)
Calcium: 9.4 mg/dL (ref 8.9–10.3)
Chloride: 101 mmol/L (ref 98–111)
Creatinine, Ser: 0.5 mg/dL — ABNORMAL LOW (ref 0.61–1.24)
GFR calc Af Amer: >60 mL/min (ref >60)
GFR calc non Af Amer: >60 mL/min (ref >60)
Glucose, Bld: 90 mg/dL (ref 70–99)
Potassium: 3.8 mmol/L (ref 3.5–5.1)
Sodium: 139 mmol/L (ref 135–145)
Total Bilirubin: 2.7 mg/dL — ABNORMAL HIGH (ref 0.3–1.2)
Total Protein: 8.6 g/dL — ABNORMAL HIGH (ref 6.5–8.1)

## 2019-09-02 MED ORDER — SODIUM CHLORIDE 0.9 % IV SOLN
25.0000 mg | INTRAVENOUS | Status: DC | PRN
  Filled 2019-09-02: qty 0.5

## 2019-09-02 MED ORDER — NALOXONE HCL 0.4 MG/ML IJ SOLN: 0.4000 mg | INTRAMUSCULAR | Status: DC | PRN

## 2019-09-02 MED ORDER — ONDANSETRON HCL 4 MG/2ML IJ SOLN: 4.0000 mg | Freq: Four times a day (QID) | INTRAMUSCULAR | Status: DC | PRN

## 2019-09-02 MED ORDER — KETOROLAC TROMETHAMINE 15 MG/ML IJ SOLN
15.0000 mg | Freq: Once | INTRAMUSCULAR | Status: AC
  Administered 2019-09-02: 15 mg via INTRAVENOUS
  Filled 2019-09-02: qty 1

## 2019-09-02 MED ORDER — SODIUM CHLORIDE 0.45 % IV SOLN: INTRAVENOUS | Status: DC

## 2019-09-02 MED ORDER — DIPHENHYDRAMINE HCL 25 MG PO CAPS: 25.0000 mg | ORAL_CAPSULE | ORAL | Status: DC | PRN

## 2019-09-02 MED ORDER — DIPHENHYDRAMINE HCL 25 MG PO CAPS
25.0000 mg | ORAL_CAPSULE | Freq: Once | ORAL | Status: AC
  Administered 2019-09-02: 25 mg via ORAL
  Filled 2019-09-02: qty 1

## 2019-09-02 MED ORDER — SODIUM CHLORIDE 0.9% FLUSH: 9.0000 mL | INTRAVENOUS | Status: DC | PRN

## 2019-09-02 MED ORDER — HYDROMORPHONE 1 MG/ML IV SOLN
INTRAVENOUS | Status: DC
  Administered 2019-09-02: 10 mg via INTRAVENOUS
  Administered 2019-09-02: 30 mg via INTRAVENOUS
  Filled 2019-09-02: qty 30

## 2019-09-02 MED ORDER — ACETAMINOPHEN 500 MG PO TABS
1000.0000 mg | ORAL_TABLET | Freq: Once | ORAL | Status: AC
  Administered 2019-09-02: 1000 mg via ORAL
  Filled 2019-09-02: qty 2

## 2019-09-02 NOTE — Discharge Instructions (Signed)
Sickle Cell Anemia, Adult  Sickle cell anemia is a condition where your red blood cells are shaped like sickles. Red blood cells carry oxygen through the body. Sickle-shaped cells do not live as long as normal red blood cells. They also clump together and block blood from flowing through the blood vessels. This prevents the body from getting enough oxygen. Sickle cell anemia causes organ damage and pain. It also increases the risk of infection. Follow these instructions at home: Medicines  Take over-the-counter and prescription medicines only as told by your doctor.  If you were prescribed an antibiotic medicine, take it as told by your doctor. Do not stop taking the antibiotic even if you start to feel better.  If you develop a fever, do not take medicines to lower the fever right away. Tell your doctor about the fever. Managing pain, stiffness, and swelling  Try these methods to help with pain: ? Use a heating pad. ? Take a warm bath. ? Distract yourself, such as by watching TV. Eating and drinking  Drink enough fluid to keep your pee (urine) clear or pale yellow. Drink more in hot weather and during exercise.  Limit or avoid alcohol.  Eat a healthy diet. Eat plenty of fruits, vegetables, whole grains, and lean protein.  Take vitamins and supplements as told by your doctor. Traveling  When traveling, keep these with you: ? Your medical information. ? The names of your doctors. ? Your medicines.  If you need to take an airplane, talk to your doctor first. Activity  Rest often.  Avoid exercises that make your heart beat much faster, such as jogging. General instructions  Do not use products that have nicotine or tobacco, such as cigarettes and e-cigarettes. If you need help quitting, ask your doctor.  Consider wearing a medical alert bracelet.  Avoid being in high places (high altitudes), such as mountains.  Avoid very hot or cold temperatures.  Avoid places where the  temperature changes a lot.  Keep all follow-up visits as told by your doctor. This is important. Contact a doctor if:  A joint hurts.  Your feet or hands hurt or swell.  You feel tired (fatigued). Get help right away if:  You have symptoms of infection. These include: ? Fever. ? Chills. ? Being very tired. ? Irritability. ? Poor eating. ? Throwing up (vomiting).  You feel dizzy or faint.  You have new stomach pain, especially on the left side.  You have a an erection (priapism) that lasts more than 4 hours.  You have numbness in your arms or legs.  You have a hard time moving your arms or legs.  You have trouble talking.  You have pain that does not go away when you take medicine.  You are short of breath.  You are breathing fast.  You have a long-term cough.  You have pain in your chest.  You have a bad headache.  You have a stiff neck.  Your stomach looks bloated even though you did not eat much.  Your skin is pale.  You suddenly cannot see well. Summary  Sickle cell anemia is a condition where your red blood cells are shaped like sickles.  Follow your doctor's advice on ways to manage pain, food to eat, activities to do, and steps to take for safe travel.  Get medical help right away if you have any signs of infection, such as a fever. This information is not intended to replace advice given to you by   your health care provider. Make sure you discuss any questions you have with your health care provider. Document Revised: 05/25/2018 Document Reviewed: 03/08/2016 Elsevier Patient Education  2020 Elsevier Inc.  

## 2019-09-02 NOTE — Progress Notes (Signed)
Patient admitted to the day hospital for treatment of sickle cell pain crisis. Patient reported pain rated 9/10 in the back and left leg . Patient placed on Dilaudid PCA, given PO Tylenol, PO Benadryl and hydrated with IV fluids. At discharge patient reported  pain at 6/10. Discharge instructions given to patient. Patient alert, oriented and ambulatory at discharge.

## 2019-09-02 NOTE — Telephone Encounter (Signed)
Patient called requesting to come to the day hospital for sickle cell pain crisis. Patient reports back pain rated 8/10. Reports taking Percocet at 5:00 am. Patient was seen in the ED last Friday for pain crisis. COVID-19 screening done and patient denies all symptoms and exposures. Denies fever, chest pain, nausea, vomiting, diarrhea, abdominal pain and priapism. Admits to having transportation at discharge without driving self. [place], [place] notified. Patient can come to the day hospital for pain management. Patient advised and expresses an understanding.

## 2019-09-02 NOTE — H&P (Signed)
[facility] History and Physical   Date: 09/02/2019  Patient name: Timothy Marks Medical record number: [id number] Date of birth: 05-13-1990 Age: 29 y.o. Gender: male PCP: Vevelyn Francois, NP  Attending physician: Tresa Garter, MD  Chief Complaint: Sickle cell pain  History of Present Illness: Timothy Marks is a 29 year old male with a medical history significant for sickle cell disease, chronic pain syndrome, opiate dependence and tolerance, and history of anemia of chronic disease that presents complaining of low back pain that is consistent with his typical pain crisis. Patient states that pain has been elevated over the past 5 days and has been unrelieved by home medications. Patient was seen in the ER on 08/30/2019 for this problem, he states that pain was better prior to discharge but returned shortly after arriving home. He last had Percocet this a.m. without sustained relief. Current pain intensity is 9/10 characterized as constant and throbbing. Patient denies any fever, chills, chest pain, urinary symptoms, nausea, vomiting, or diarrhea. He has had no sick contacts, recent travel, or exposure to COVID-19.  Meds: Medications Prior to Admission  Medication Sig Dispense Refill Last Dose  . acetaminophen (TYLENOL) 500 MG tablet Take 500-1,000 mg by mouth every 6 (six) hours as needed for mild pain or headache.     . cholecalciferol (VITAMIN D) 1000 units tablet Take 2 tablets (2,000 Units total) by mouth daily. (Patient not taking: Reported on 08/30/2019) 30 tablet 6   . folic acid (FOLVITE) 1 MG tablet Take 1 tablet (1 mg total) by mouth daily. (Patient not taking: Reported on 08/30/2019) 90 tablet 2   . gabapentin (NEURONTIN) 300 MG capsule Take 1 capsule (300 mg total) by mouth 3 (three) times daily. (Patient taking differently: Take 300 mg by mouth 2 (two) times daily. ) 90 capsule 5   . hydroxyurea (HYDREA) 500 MG capsule Take 1 capsule (500 mg total) by mouth 2  (two) times daily. May take with food to minimize GI side effects. (Patient not taking: Reported on 08/30/2019) 180 capsule 2   . ibuprofen (ADVIL) 800 MG tablet Take 1 tablet (800 mg total) by mouth 3 (three) times daily as needed (pain). (Patient not taking: Reported on 08/30/2019) 30 tablet 0   . Nutritional Supplements (ENSURE ACTIVE HIGH PROTEIN) LIQD Take 1 each by mouth 3 (three) times daily between meals. #90 cans of Ensure (Patient not taking: Reported on 08/30/2019) 237 mL 11   . oxyCODONE-acetaminophen (PERCOCET) 10-325 MG tablet Take 1 tablet by mouth every 6 (six) hours as needed for up to 15 days for pain. (Patient taking differently: Take 1 tablet by mouth every 4 (four) hours as needed for pain. ) 60 tablet 0   . [START ON 09/14/2019] OXYCONTIN 20 MG 12 hr tablet Take 1 tablet (20 mg total) by mouth every 12 (twelve) hours. 60 tablet 0     Allergies: Buprenorphine hcl, Levaquin [levofloxacin], Meperidine, Morphine, Morphine and related, Toradol [ketorolac tromethamine], Tramadol, Vancomycin, and Zosyn [piperacillin sod-tazobactam so] Past Medical History:  Diagnosis Date  . Depression   . Generalized weakness 03/31/2019  . 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 Sickle cell anemia (HCC)   . Vitamin D deficiency y-6   Past Surgical History:  Procedure Laterality Date  . CHOLECYSTECTOMY     Family History  Problem Relation Age of Onset  . Sickle cell trait Mother   . Sickle cell trait Father  Social History   Socioeconomic History  . Marital status: Single    Spouse name: Not on file  . Number of children: Not on file  . Years of education: Not on file  . Highest education level: Not on file  Occupational History  . Not on file  Tobacco Use  . Smoking status: Never Smoker  . Smokeless tobacco: Never Used  Vaping Use  . Vaping Use: Never used  Substance and Sexual Activity  . Alcohol use: No  . Drug use:  Not Currently    Types: Marijuana  . Sexual activity: Not on file  Other Topics Concern  . Not on file  Social History Narrative  . Not on file   Social Determinants of Health   Financial Resource Strain:   . Difficulty of Paying Living Expenses:   Food Insecurity:   . Worried About Charity fundraiser in the Last Year:   . Arboriculturist in the Last Year:   Transportation Needs:   . Film/video editor (Medical):   Marland Kitchen Lack of Transportation (Non-Medical):   Physical Activity:   . Days of Exercise per Week:   . Minutes of Exercise per Session:   Stress:   . Feeling of Stress :   Social Connections:   . Frequency of Communication with Friends and Family:   . Frequency of Social Gatherings with Friends and Family:   . Attends Religious Services:   . Active Member of Clubs or Organizations:   . Attends Archivist Meetings:   Marland Kitchen Marital Status:   Intimate Partner Violence:   . Fear of Current or Ex-Partner:   . Emotionally Abused:   Marland Kitchen Physically Abused:   . Sexually Abused:    Review of Systems  Constitutional: Negative.   HENT: Negative.   Eyes: Negative.   Respiratory: Negative.   Cardiovascular: Negative.   Gastrointestinal: Negative.   Genitourinary: Negative.   Musculoskeletal: Positive for back pain and joint pain.  Skin: Negative.   Neurological: Negative.   Endo/Heme/Allergies: Negative.   Psychiatric/Behavioral: Negative.    Physical Exam Constitutional:      Appearance: Normal appearance.  HENT:     Head: Normocephalic.     Mouth/Throat:     Mouth: Mucous membranes are moist.  Eyes:     Pupils: Pupils are equal, round, and reactive to light.  Cardiovascular:     Rate and Rhythm: Normal rate and regular rhythm.  Pulmonary:     Effort: Pulmonary effort is normal.  Abdominal:     General: Bowel sounds are normal.     Palpations: Abdomen is soft.  Musculoskeletal:        General: Normal range of motion.  Skin:    General: Skin is  warm.  Neurological:     General: No focal deficit present.     Mental Status: He is alert. Mental status is at baseline.  Psychiatric:        Mood and Affect: Mood normal.        Thought Content: Thought content normal.        Judgment: Judgment normal.     Lab results: No results found for this or any previous visit (from the past 24 hour(s)).  Imaging results:  No results found.   Assessment & Plan: Patient admitted to sickle cell day infusion center for management of pain crisis.  Patient is opiate tolerant Initiate IV dilaudid PCA. Settings of 0.5 mg, 10-minute lockout, and 3 mg/h IV  fluids, 0.45% saline at 100 mL/h Toradol 15 mg IV times one dose Tylenol 1000 mg by mouth times one dose Review CBC with differential, complete metabolic panel, and reticulocytes as results become available. Baseline hemoglobin is 9-10 g/dL Pain intensity will be reevaluated in context of functioning and relationship to baseline as care progress If pain intensity remains elevated and/or sudden change in hemodynamic stability transition to inpatient services for higher level of care.     [name]  APRN, MSN, FNP-C [facility] [address] [place], [place] [address] [phone]   09/02/2019, 9:32 AM

## 2019-09-02 NOTE — Discharge Summary (Signed)
[facility] Discharge Summary   Patient ID: Timothy Marks MRN: [id number] DOB/AGE: 22-Jan-1991 29 y.o.  Admit date: 09/02/2019 Discharge date: 09/02/2019  Primary Care Physician:  Vevelyn Francois, NP  Admission Diagnoses:  Active Problems:   Sickle cell pain crisis [facility])   Discharge Medications:  Allergies as of 09/02/2019      Reactions   Buprenorphine Hcl Hives   Levaquin [levofloxacin] Itching   Meperidine Rash   Morphine Hives   Morphine And Related Hives   Toradol [ketorolac Tromethamine] Itching   Tramadol Hives   Vancomycin Itching   Zosyn [piperacillin Sod-tazobactam So] Itching, Rash, Other (See Comments)   Has taken rocephin in the past      Medication List    TAKE these medications   acetaminophen 500 MG tablet Commonly known as: TYLENOL Take 500-1,000 mg by mouth every 6 (six) hours as needed for mild pain or headache.   cholecalciferol 25 MCG (1000 UNIT) tablet Commonly known as: VITAMIN D Take 2 tablets (2,000 Units total) by mouth daily.   Ensure Active High Protein Liqd Take 1 each by mouth 3 (three) times daily between meals. #90 cans of Ensure   folic acid 1 MG tablet Commonly known as: FOLVITE Take 1 tablet (1 mg total) by mouth daily.   gabapentin 300 MG capsule Commonly known as: NEURONTIN Take 1 capsule (300 mg total) by mouth 3 (three) times daily. What changed: when to take this   hydroxyurea 500 MG capsule Commonly known as: HYDREA Take 1 capsule (500 mg total) by mouth 2 (two) times daily. May take with food to minimize GI side effects.   ibuprofen 800 MG tablet Commonly known as: ADVIL Take 1 tablet (800 mg total) by mouth 3 (three) times daily as needed (pain).   oxyCODONE-acetaminophen 10-325 MG tablet Commonly known as: Percocet Take 1 tablet by mouth every 6 (six) hours as needed for up to 15 days for pain. What changed: when to take this   OxyCONTIN 20 mg 12 hr tablet Generic drug: oxyCODONE Take 1 tablet  (20 mg total) by mouth every 12 (twelve) hours. Start taking on: September 14, 2019        Consults:  None  Significant Diagnostic Studies:  DG Chest 2 View  Result Date: 08/30/2019 CLINICAL DATA:  Chest pain in a patient with a history of sickle cell disease. EXAM: CHEST - 2 VIEW COMPARISON:  PA and lateral chest 05/30/2019. FINDINGS: Lungs clear. Heart size normal. No pneumothorax or pleural effusion. No acute bony abnormality. Bony changes of sickle cell disease noted. IMPRESSION: No acute disease. Electronically Signed   By: Inge Rise M.D.   On: 08/30/2019 09:41   DG Chest Port 1 View  Result Date: 08/30/2019 CLINICAL DATA:  Chest pain EXAM: PORTABLE CHEST 1 VIEW COMPARISON:  08/30/2019 FINDINGS: The heart size and mediastinal contours are within normal limits. Both lungs are clear. The visualized skeletal structures are unremarkable. IMPRESSION: No active disease. Electronically Signed   By: Fidela Salisbury MD   On: 08/30/2019 19:57   History of present illness:  Timothy Marks is a 29 year old male with a medical history significant for sickle cell disease, chronic pain syndrome, opiate dependence and tolerance, and history of anemia of chronic disease that presents complaining of low back pain that is consistent with his typical pain crisis. Patient states that pain has been elevated over the past 5 days and has been unrelieved by home medications. Patient was seen in the ER on  08/30/2019 for this problem, he states that pain was better prior to discharge but returned shortly after arriving home. He last had Percocet this a.m. without sustained relief. Current pain intensity is 9/10 characterized as constant and throbbing. Patient denies any fever, chills, chest pain, urinary symptoms, nausea, vomiting, or diarrhea. He has had no sick contacts, recent travel, or exposure to COVID-19.  [facility] Course: Patient admitted to [facility] for management of  pain crisis. Reviewed all laboratory values, WBCs 15.3, consistent with previous. Patient is afebrile without any signs of infection or inflammation. Hemoglobin is 8.6, which is slightly below patient's baseline, he does not warrant a transfusion on today. Pain managed with IV Dilaudid via PCA IV Toradol, Tylenol, and IV fluids. Pain intensity decreased to 6/10. Patient does not warrant admission on today. Advised to continue home medications. He is alert, oriented, and ambulating without assistance. Patient will discharge home in a hemodynamically stable condition.  Discharge instructions: Resume all home medications.   Follow up with PCP as previously  scheduled.   Discussed the importance of drinking 64 ounces of water daily, dehydration of red blood cells may lead further sickling.   Avoid all stressors that precipitate sickle cell pain crisis.     The patient was given clear instructions to go to ER or return to medical center if symptoms do not improve, worsen or new problems develop.       Physical Exam at Discharge:  BP (!) 106/48 (BP Location: Left Arm)   Pulse 86   Temp 97.6 F (36.4 C) (Oral)   Resp 16   SpO2 100%  Physical Exam Constitutional:      Appearance: Normal appearance.  HENT:     Head: Normocephalic.  Eyes:     Pupils: Pupils are equal, round, and reactive to light.  Cardiovascular:     Rate and Rhythm: Normal rate and regular rhythm.     Pulses: Normal pulses.  Pulmonary:     Effort: Pulmonary effort is normal.  Abdominal:     General: Abdomen is flat. Bowel sounds are normal.     Palpations: Abdomen is soft.  Musculoskeletal:        General: Normal range of motion.  Skin:    General: Skin is warm.  Neurological:     General: No focal deficit present.     Mental Status: He is alert.  Psychiatric:        Mood and Affect: Mood normal.        Behavior: Behavior normal.        Thought Content: Thought content normal.        Judgment:  Judgment normal.      Disposition at Discharge: Discharge disposition: 01-Home or Self Care       Discharge Orders: Discharge Instructions    Discharge patient   Complete by: As directed    Discharge disposition: 01-Home or Self Care   Discharge patient date: 09/02/2019      Condition at Discharge:   Stable  Time spent on Discharge:  Greater than 30 minutes.  Signed: Cammie Sickle 09/02/2019, 5:37 PM

## 2019-09-03 ENCOUNTER — Other Ambulatory Visit: Payer: Self-pay | Admitting: Family Medicine

## 2019-09-03 DIAGNOSIS — G894 Chronic pain syndrome: Secondary | ICD-10-CM

## 2019-09-03 DIAGNOSIS — D571 Sickle-cell disease without crisis: Secondary | ICD-10-CM

## 2019-09-03 LAB — DRUG SCREEN 764883 11+OXYCO+ALC+CRT-BUND
Amphetamines, Urine: NEGATIVE ng/mL
BENZODIAZ UR QL: NEGATIVE ng/mL
Barbiturate: NEGATIVE ng/mL
Cocaine (Metabolite): NEGATIVE ng/mL
Creatinine: 96.4 mg/dL (ref 20.0–300.0)
Ethanol: NEGATIVE %
Meperidine: NEGATIVE ng/mL
Methadone Screen, Urine: NEGATIVE ng/mL
Phencyclidine: NEGATIVE ng/mL
Propoxyphene: NEGATIVE ng/mL
Tramadol: NEGATIVE ng/mL
pH, Urine: 7.5 (ref 4.5–8.9)

## 2019-09-03 LAB — OXYCODONE/OXYMORPHONE, CONFIRM
OXYCODONE/OXYMORPH: POSITIVE — AB
OXYCODONE: 1860 ng/mL
OXYCODONE: POSITIVE — AB
OXYMORPHONE (GC/MS): >3000 ng/mL
OXYMORPHONE: POSITIVE — AB

## 2019-09-03 LAB — OPIATES CONFIRMATION, URINE: Opiates: NEGATIVE ng/mL

## 2019-09-03 LAB — CANNABINOID CONFIRMATION, UR
CANNABINOIDS: POSITIVE — AB
Carboxy THC GC/MS Conf: >300 ng/mL

## 2019-09-04 ENCOUNTER — Inpatient Hospital Stay (HOSPITAL_COMMUNITY)
Admission: EM | Admit: 2019-09-04 | Discharge: 2019-09-10 | DRG: 812 | Disposition: A | Discharge status: Home / Self Care | Payer: Medicaid Other | Attending: Internal Medicine | Admitting: Internal Medicine

## 2019-09-04 ENCOUNTER — Other Ambulatory Visit: Payer: Self-pay

## 2019-09-04 ENCOUNTER — Encounter (HOSPITAL_COMMUNITY): Payer: Self-pay | Admitting: Emergency Medicine

## 2019-09-04 DIAGNOSIS — M545 Low back pain, unspecified: Secondary | ICD-10-CM | POA: Diagnosis present

## 2019-09-04 DIAGNOSIS — F4325 Adjustment disorder with mixed disturbance of emotions and conduct: Secondary | ICD-10-CM | POA: Diagnosis present

## 2019-09-04 DIAGNOSIS — Z79891 Long term (current) use of opiate analgesic: Secondary | ICD-10-CM

## 2019-09-04 DIAGNOSIS — Z79899 Other long term (current) drug therapy: Secondary | ICD-10-CM

## 2019-09-04 DIAGNOSIS — Z832 Family history of diseases of the blood and blood-forming organs and certain disorders involving the immune mechanism: Secondary | ICD-10-CM

## 2019-09-04 DIAGNOSIS — G894 Chronic pain syndrome: Secondary | ICD-10-CM | POA: Diagnosis present

## 2019-09-04 DIAGNOSIS — D72829 Elevated white blood cell count, unspecified: Secondary | ICD-10-CM | POA: Diagnosis present

## 2019-09-04 DIAGNOSIS — Z20822 Contact with and (suspected) exposure to covid-19: Secondary | ICD-10-CM | POA: Diagnosis present

## 2019-09-04 DIAGNOSIS — D5700 Hb-SS disease with crisis, unspecified: Principal | ICD-10-CM | POA: Diagnosis present

## 2019-09-04 DIAGNOSIS — F329 Major depressive disorder, single episode, unspecified: Secondary | ICD-10-CM | POA: Diagnosis present

## 2019-09-04 LAB — COMPREHENSIVE METABOLIC PANEL
ALT: 11 U/L (ref 0–44)
AST: 18 U/L (ref 15–41)
Albumin: 4.2 g/dL (ref 3.5–5.0)
Alkaline Phosphatase: 68 U/L (ref 38–126)
Anion gap: 12 (ref 5–15)
BUN: 9 mg/dL (ref 6–20)
CO2: 24 mmol/L (ref 22–32)
Calcium: 9.5 mg/dL (ref 8.9–10.3)
Chloride: 102 mmol/L (ref 98–111)
Creatinine, Ser: 0.58 mg/dL — ABNORMAL LOW (ref 0.61–1.24)
GFR calc Af Amer: >60 mL/min (ref >60)
GFR calc non Af Amer: >60 mL/min (ref >60)
Glucose, Bld: 95 mg/dL (ref 70–99)
Potassium: 3.9 mmol/L (ref 3.5–5.1)
Sodium: 138 mmol/L (ref 135–145)
Total Bilirubin: 1.8 mg/dL — ABNORMAL HIGH (ref 0.3–1.2)
Total Protein: 8.4 g/dL — ABNORMAL HIGH (ref 6.5–8.1)

## 2019-09-04 LAB — CBC WITH DIFFERENTIAL/PLATELET
Abs Immature Granulocytes: 0.06 10*3/uL (ref 0.00–0.07)
Basophils Absolute: 0 10*3/uL (ref 0.0–0.1)
Basophils Relative: 0 %
Eosinophils Absolute: 0.1 10*3/uL (ref 0.0–0.5)
Eosinophils Relative: 1 %
HCT: 23.3 % — ABNORMAL LOW (ref 39.0–52.0)
Hemoglobin: 7.8 g/dL — ABNORMAL LOW (ref 13.0–17.0)
Immature Granulocytes: 0 %
Lymphocytes Relative: 19 %
Lymphs Abs: 2.7 10*3/uL (ref 0.7–4.0)
MCH: 28.4 pg (ref 26.0–34.0)
MCHC: 33.5 g/dL (ref 30.0–36.0)
MCV: 84.7 fL (ref 80.0–100.0)
Monocytes Absolute: 1.2 10*3/uL — ABNORMAL HIGH (ref 0.1–1.0)
Monocytes Relative: 8 %
Neutro Abs: 10.1 10*3/uL — ABNORMAL HIGH (ref 1.7–7.7)
Neutrophils Relative %: 72 %
Platelets: 560 10*3/uL — ABNORMAL HIGH (ref 150–400)
RBC: 2.75 MIL/uL — ABNORMAL LOW (ref 4.22–5.81)
RDW: 18.5 % — ABNORMAL HIGH (ref 11.5–15.5)
WBC: 14.2 10*3/uL — ABNORMAL HIGH (ref 4.0–10.5)
nRBC: 1.1 % — ABNORMAL HIGH (ref 0.0–0.2)

## 2019-09-04 LAB — RETICULOCYTES
Immature Retic Fract: 30.3 % — ABNORMAL HIGH (ref 2.3–15.9)
RBC.: 2.73 MIL/uL — ABNORMAL LOW (ref 4.22–5.81)
Retic Count, Absolute: 241.5 10*3/uL — ABNORMAL HIGH (ref 19.0–186.0)
Retic Ct Pct: 9.3 % — ABNORMAL HIGH (ref 0.4–3.1)

## 2019-09-04 MED ORDER — HYDROMORPHONE HCL 2 MG/ML IJ SOLN
2.0000 mg | INTRAMUSCULAR | Status: AC
  Administered 2019-09-05: 2 mg via INTRAVENOUS
  Filled 2019-09-04: qty 1

## 2019-09-04 MED ORDER — HYDROMORPHONE HCL 2 MG/ML IJ SOLN
2.0000 mg | INTRAMUSCULAR | Status: AC
  Administered 2019-09-04: 2 mg via INTRAVENOUS
  Filled 2019-09-04: qty 1

## 2019-09-04 MED ORDER — ONDANSETRON HCL 4 MG/2ML IJ SOLN
4.0000 mg | INTRAMUSCULAR | Status: DC | PRN
  Administered 2019-09-04 – 2019-09-08 (×3): 4 mg via INTRAVENOUS
  Filled 2019-09-04 (×4): qty 2

## 2019-09-04 MED ORDER — DEXTROSE-NACL 5-0.45 % IV SOLN: INTRAVENOUS | Status: DC

## 2019-09-04 MED ORDER — DIPHENHYDRAMINE HCL 25 MG PO CAPS
25.0000 mg | ORAL_CAPSULE | ORAL | Status: DC | PRN
  Administered 2019-09-04: 50 mg via ORAL
  Filled 2019-09-04: qty 2

## 2019-09-04 MED ORDER — SODIUM CHLORIDE 0.9% FLUSH: 3.0000 mL | Freq: Once | INTRAVENOUS | Status: DC

## 2019-09-04 NOTE — ED Triage Notes (Signed)
PER EMS: Patient is coming from home with c/o SCC for the past 3-4 days. Generalized pain, 10/10.  143mcg fentanyl IM  BP 108/60 HR 90 RR 15 SPO2 100% RA

## 2019-09-04 NOTE — ED Provider Notes (Signed)
[facility] DEPT Provider Note   CSN: [id number] Arrival date & time: 09/04/19  2218     History No chief complaint on file.   Timothy Marks is a 29 y.o. male.  The history is provided by the patient.  Sickle Cell Pain Crisis Pain location: back and Lower extremities  Severity:  Severe Onset quality:  Gradual Similar to previous crisis episodes: yes   Timing:  Constant Progression:  Worsening Chronicity:  Recurrent Sickle cell genotype:  SS Frequency of attacks:  Frequent  Context: not alcohol consumption and not infection   Relieved by:  Prescription drugs Associated symptoms: no chest pain, no congestion, no fever and no shortness of breath   Patient with HbSS presents with pain all over but it is worst in back and BLE.  No swelling.  No f/c/r.  No n/v/d.       Past Medical History:  Diagnosis Date  . Depression   . Generalized weakness 03/31/2019  . 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 Sickle cell anemia (HCC)   . Vitamin D deficiency y-6    Patient Active Problem List   Diagnosis Date Noted  . Underweight 06/14/2019  . Generalized weakness 03/31/2019  . Sickle-cell crisis ([place]) 03/24/2019  . SIRS (systemic inflammatory response syndrome) ([place]) 02/10/2019  . Abnormal liver function 11/04/2018  . Sickle cell anemia with crisis ([place]) 05/22/2018  . Acute bilateral low back pain without sciatica   . Other chronic pain   . Anemia   . 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 Physical deconditioning   . Agitation   . Chronic pain syndrome   . Recurrent cold sores   . Fever   . Pain   . Sickle cell crisis ([place]) 07/16/2017  . Adjustment disorder with mixed disturbance of emotions and conduct   . Constipation 08/03/2014  . Leukocytosis 06/30/2014  . 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 Protein-calorie malnutrition, severe ([place]) 04/09/2014  . Major depressive disorder, recurrent, severe  without psychotic features ([place])   . 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 Vitamin D deficiency   . Sickle cell anemia ([place]) 03/30/2014  . Hyperbilirubinemia 03/30/2014  . Hypokalemia 02/07/2014  . Aggressive behavior 01/25/2013  . Sickle cell anemia with pain ([place]) 01/24/2013  . Sickle cell pain crisis ([place]) 01/24/2013    Past Surgical History:  Procedure Laterality Date  . CHOLECYSTECTOMY         Family History  Problem Relation Age of Onset  . Sickle cell trait Mother   . Sickle cell trait Father     Social History   Tobacco Use  . Smoking status: Never Smoker  . Smokeless tobacco: Never Used  Vaping Use  . Vaping Use: Never used  Substance Use Topics  . Alcohol use: No  . Drug use: Not Currently    Types: Marijuana    Home Medications Prior to Admission medications   Medication Sig Start Date End Date Taking? Authorizing Provider  acetaminophen (TYLENOL) 500 MG tablet Take 500-1,000 mg by mouth every 6 (six) hours as needed for mild pain or headache.    [provider]  cholecalciferol (VITAMIN D) 1000 units tablet Take 2 tablets (2,000 Units total) by mouth daily. Patient not taking: Reported on 08/30/2019 12/13/17   Azzie Glatter, FNP  folic acid (FOLVITE) 1 MG tablet Take 1 tablet (1 mg total) by mouth daily. Patient not taking: Reported on  08/30/2019 05/01/17   Dorena Dew, FNP  gabapentin (NEURONTIN) 300 MG capsule Take 1 capsule (300 mg total) by mouth 3 (three) times daily. Patient taking differently: Take 300 mg by mouth 2 (two) times daily.  03/12/19   Dorena Dew, FNP  hydroxyurea (HYDREA) 500 MG capsule Take 1 capsule (500 mg total) by mouth 2 (two) times daily. May take with food to minimize GI side effects. Patient not taking: Reported on 08/30/2019 05/01/17   Dorena Dew, FNP  ibuprofen (ADVIL) 800 MG tablet TAKE 1 TABLET(800 MG) BY MOUTH THREE TIMES DAILY AS NEEDED FOR PAIN 09/03/19   Vevelyn Francois, NP  ibuprofen (ADVIL) 800 MG tablet TAKE 1 TABLET(800 MG) BY MOUTH THREE TIMES DAILY AS NEEDED FOR PAIN 09/03/19   Vevelyn Francois, NP  Nutritional Supplements (ENSURE ACTIVE HIGH PROTEIN) LIQD Take 1 each by mouth 3 (three) times daily between meals. #90 cans of Ensure Patient not taking: Reported on 08/30/2019 11/13/18   Dorena Dew, FNP  oxyCODONE-acetaminophen (PERCOCET) 10-325 MG tablet Take 1 tablet by mouth every 6 (six) hours as needed for up to 15 days for pain. Patient taking differently: Take 1 tablet by mouth every 4 (four) hours as needed for pain.  08/29/19 09/13/19  Vevelyn Francois, NP  OXYCONTIN 20 MG 12 hr tablet Take 1 tablet (20 mg total) by mouth every 12 (twelve) hours. 09/14/19 10/14/19  Vevelyn Francois, NP    Allergies    Buprenorphine hcl, Levaquin [levofloxacin], Meperidine, Morphine, Morphine and related, Toradol [ketorolac tromethamine], Tramadol, Vancomycin, and Zosyn [piperacillin sod-tazobactam so]  Review of Systems   Review of Systems  Constitutional: Negative for fever.  HENT: Negative for congestion.   Eyes: Negative for visual disturbance.  Respiratory: Negative for shortness of breath.   Cardiovascular: Negative for chest pain and leg swelling.  Gastrointestinal: Negative for abdominal pain.  Genitourinary: Negative for difficulty urinating.  Musculoskeletal: Positive for arthralgias. Negative for joint swelling.  Skin: Negative for rash.  Neurological: Negative for dizziness.  Psychiatric/Behavioral: Negative for agitation.  All other systems reviewed and are negative.   Physical Exam Updated Vital Signs BP 114/66 (BP Location: Left Arm)   Pulse 78   Temp 99.1 F (37.3 C)   Resp (!) 23   Ht 6\' 2"  (1.88 m)   Wt 54.4 kg   SpO2 100%   BMI 15.41 kg/m   Physical Exam Vitals and nursing note reviewed.  Constitutional:      General: He is not in acute distress.    Appearance: Normal appearance.  HENT:     Head: Normocephalic and  atraumatic.     Nose: Nose normal.  Eyes:     Conjunctiva/sclera: Conjunctivae normal.     Pupils: Pupils are equal, round, and reactive to light.  Cardiovascular:     Rate and Rhythm: Normal rate and regular rhythm.     Pulses: Normal pulses.     Heart sounds: Normal heart sounds.  Pulmonary:     Effort: Pulmonary effort is normal.     Breath sounds: Normal breath sounds.  Abdominal:     General: Abdomen is flat. Bowel sounds are normal.     Tenderness: There is no abdominal tenderness. There is no guarding.  Musculoskeletal:        General: Normal range of motion.     Cervical back: Normal range of motion and neck supple.  Skin:    General: Skin is warm and dry.  Capillary Refill: Capillary refill takes less than 2 seconds.  Neurological:     General: No focal deficit present.     Mental Status: He is alert and oriented to person, place, and time.     Deep Tendon Reflexes: Reflexes normal.  Psychiatric:        Mood and Affect: Mood normal.        Behavior: Behavior normal.     ED Results / Procedures / Treatments   Labs (all labs ordered are listed, but only abnormal results are displayed) Results for orders placed or performed during the hospital encounter of 09/04/19  CBC with Differential  Result Value Ref Range   WBC PENDING 4.0 - 10.5 K/uL   RBC 2.75 (L) 4.22 - 5.81 MIL/uL   Hemoglobin 7.8 (L) 13.0 - 17.0 g/dL   HCT 23.3 (L) 39 - 52 %   MCV 84.7 80.0 - 100.0 fL   MCH 28.4 26.0 - 34.0 pg   MCHC 33.5 30.0 - 36.0 g/dL   RDW 18.5 (H) 11.5 - 15.5 %   Platelets 560 (H) 150 - 400 K/uL   nRBC PENDING 0.0 - 0.2 %   Neutrophils Relative % PENDING %   Neutro Abs PENDING 1.7 - 7.7 K/uL   Band Neutrophils PENDING %   Lymphocytes Relative PENDING %   Lymphs Abs PENDING 0.7 - 4.0 K/uL   Monocytes Relative PENDING %   Monocytes Absolute PENDING 0 - 1 K/uL   Eosinophils Relative PENDING %   Eosinophils Absolute PENDING 0 - 0 K/uL   Basophils Relative PENDING %    Basophils Absolute PENDING 0 - 0 K/uL   WBC Morphology PENDING    RBC Morphology PENDING    Smear Review PENDING    Other PENDING %   nRBC PENDING 0 /100 WBC   Metamyelocytes Relative PENDING %   Myelocytes PENDING %   Promyelocytes Relative PENDING %   Blasts PENDING %   Immature Granulocytes PENDING %   Abs Immature Granulocytes PENDING 0.00 - 0.07 K/uL   DG Chest 2 View  Result Date: 08/30/2019 CLINICAL DATA:  Chest pain in a patient with a history of sickle cell disease. EXAM: CHEST - 2 VIEW COMPARISON:  PA and lateral chest 05/30/2019. FINDINGS: Lungs clear. Heart size normal. No pneumothorax or pleural effusion. No acute bony abnormality. Bony changes of sickle cell disease noted. IMPRESSION: No acute disease. Electronically Signed   By: Inge Rise M.D.   On: 08/30/2019 09:41   DG Chest Port 1 View  Result Date: 08/30/2019 CLINICAL DATA:  Chest pain EXAM: PORTABLE CHEST 1 VIEW COMPARISON:  08/30/2019 FINDINGS: The heart size and mediastinal contours are within normal limits. Both lungs are clear. The visualized skeletal structures are unremarkable. IMPRESSION: No active disease. Electronically Signed   By: Fidela Salisbury MD   On: 08/30/2019 19:57    Radiology No results found.  Procedures Procedures (including critical care time)  Medications Ordered in ED Medications  sodium chloride flush (NS) 0.9 % injection 3 mL (0 mLs Intravenous Hold 09/04/19 2236)  diphenhydrAMINE (BENADRYL) capsule 25-50 mg (50 mg Oral Given 09/04/19 2317)  ondansetron (ZOFRAN) injection 4 mg (4 mg Intravenous Given 09/04/19 2318)  dextrose 5 %-0.45 % sodium chloride infusion ( Intravenous New Bag/Given 09/04/19 2333)  HYDROmorphone (DILAUDID) injection 1 mg (has no administration in time range)  HYDROmorphone (DILAUDID) injection 2 mg (2 mg Intravenous Given 09/04/19 2317)  HYDROmorphone (DILAUDID) injection 2 mg (2 mg Intravenous Given 09/05/19 0002)  ED Course  I have reviewed the triage  vital signs and the nursing notes.  Pertinent labs & imaging results that were available during my care of the patient were reviewed by me and considered in my medical decision making (see chart for details).    Protocol initiated immediately, prior to seeing patient.  Patient thinks he will need to be admitted.    Timothy Marks was evaluated in Emergency Department on 09/04/2019 for the symptoms described in the history of present illness. He was evaluated in the context of the global COVID-19 pandemic, which necessitated consideration that the patient might be at risk for infection with the SARS-CoV-2 virus that causes COVID-19. Institutional protocols and algorithms that pertain to the evaluation of patients at risk for COVID-19 are in a state of rapid change based on information released by regulatory bodies including the CDC and federal and state organizations. These policies and algorithms were followed during the patient's care in the ED.  Final Clinical Impression(s) / ED Diagnoses Patient is requesting admission due to ongoing pain. Will admit to medicine    Aldin Drees, MD 09/05/19 0100

## 2019-09-05 ENCOUNTER — Encounter (HOSPITAL_COMMUNITY): Payer: Self-pay | Admitting: Emergency Medicine

## 2019-09-05 DIAGNOSIS — F4325 Adjustment disorder with mixed disturbance of emotions and conduct: Secondary | ICD-10-CM

## 2019-09-05 DIAGNOSIS — D5700 Hb-SS disease with crisis, unspecified: Secondary | ICD-10-CM | POA: Diagnosis present

## 2019-09-05 DIAGNOSIS — G894 Chronic pain syndrome: Secondary | ICD-10-CM

## 2019-09-05 DIAGNOSIS — F329 Major depressive disorder, single episode, unspecified: Secondary | ICD-10-CM | POA: Diagnosis present

## 2019-09-05 DIAGNOSIS — M545 Low back pain: Secondary | ICD-10-CM | POA: Diagnosis not present

## 2019-09-05 DIAGNOSIS — Z79899 Other long term (current) drug therapy: Secondary | ICD-10-CM | POA: Diagnosis not present

## 2019-09-05 DIAGNOSIS — Z832 Family history of diseases of the blood and blood-forming organs and certain disorders involving the immune mechanism: Secondary | ICD-10-CM | POA: Diagnosis not present

## 2019-09-05 DIAGNOSIS — D72829 Elevated white blood cell count, unspecified: Secondary | ICD-10-CM | POA: Diagnosis present

## 2019-09-05 DIAGNOSIS — Z20822 Contact with and (suspected) exposure to covid-19: Secondary | ICD-10-CM | POA: Diagnosis present

## 2019-09-05 DIAGNOSIS — Z79891 Long term (current) use of opiate analgesic: Secondary | ICD-10-CM | POA: Diagnosis not present

## 2019-09-05 LAB — RETICULOCYTES
Immature Retic Fract: 31.3 % — ABNORMAL HIGH (ref 2.3–15.9)
RBC.: 2.42 MIL/uL — ABNORMAL LOW (ref 4.22–5.81)
Retic Count, Absolute: 211.5 10*3/uL — ABNORMAL HIGH (ref 19.0–186.0)
Retic Ct Pct: 8.7 % — ABNORMAL HIGH (ref 0.4–3.1)

## 2019-09-05 LAB — SARS CORONAVIRUS 2 BY RT PCR (HOSPITAL ORDER, PERFORMED IN ~~LOC~~ HOSPITAL LAB): SARS Coronavirus 2: NEGATIVE

## 2019-09-05 MED ORDER — GABAPENTIN 300 MG PO CAPS
300.0000 mg | ORAL_CAPSULE | Freq: Three times a day (TID) | ORAL | Status: DC
  Administered 2019-09-05 – 2019-09-10 (×15): 300 mg via ORAL
  Filled 2019-09-05 (×15): qty 1

## 2019-09-05 MED ORDER — HYDROMORPHONE 1 MG/ML IV SOLN: INTRAVENOUS | Status: DC

## 2019-09-05 MED ORDER — SODIUM CHLORIDE 0.45 % IV BOLUS
1000.0000 mL | Freq: Once | INTRAVENOUS | Status: AC
  Administered 2019-09-05: 1000 mL via INTRAVENOUS

## 2019-09-05 MED ORDER — OXYCODONE HCL 5 MG PO TABS
5.0000 mg | ORAL_TABLET | ORAL | Status: DC | PRN
  Administered 2019-09-05 – 2019-09-10 (×17): 5 mg via ORAL
  Filled 2019-09-05 (×17): qty 1

## 2019-09-05 MED ORDER — OXYCODONE-ACETAMINOPHEN 5-325 MG PO TABS
1.0000 | ORAL_TABLET | ORAL | Status: DC | PRN
  Administered 2019-09-05 – 2019-09-10 (×16): 1 via ORAL
  Filled 2019-09-05 (×16): qty 1

## 2019-09-05 MED ORDER — HYDROXYUREA 500 MG PO CAPS
500.0000 mg | ORAL_CAPSULE | Freq: Two times a day (BID) | ORAL | Status: DC
  Administered 2019-09-05 – 2019-09-07 (×4): 500 mg via ORAL
  Filled 2019-09-05 (×4): qty 1

## 2019-09-05 MED ORDER — ENOXAPARIN SODIUM 40 MG/0.4ML ~~LOC~~ SOLN
40.0000 mg | SUBCUTANEOUS | Status: DC
  Administered 2019-09-09: 40 mg via SUBCUTANEOUS
  Filled 2019-09-05 (×4): qty 0.4

## 2019-09-05 MED ORDER — ACETAMINOPHEN 500 MG PO TABS: 500.0000 mg | ORAL_TABLET | Freq: Four times a day (QID) | ORAL | Status: DC | PRN

## 2019-09-05 MED ORDER — HYDROMORPHONE HCL 1 MG/ML IJ SOLN
1.0000 mg | Freq: Once | INTRAMUSCULAR | Status: AC
  Administered 2019-09-05: 1 mg via INTRAVENOUS
  Filled 2019-09-05: qty 1

## 2019-09-05 MED ORDER — HYDROMORPHONE 1 MG/ML IV SOLN
INTRAVENOUS | Status: DC
  Administered 2019-09-05: 7.5 mg via INTRAVENOUS
  Administered 2019-09-05: 6.5 mg via INTRAVENOUS
  Administered 2019-09-05: 30 mg via INTRAVENOUS
  Administered 2019-09-05: 4 mg via INTRAVENOUS
  Administered 2019-09-05: 2.5 mg via INTRAVENOUS
  Administered 2019-09-06: 6 mg via INTRAVENOUS
  Administered 2019-09-06: 0.5 mg via INTRAVENOUS
  Administered 2019-09-06: 3.5 mg via INTRAVENOUS
  Administered 2019-09-06: 30 mg via INTRAVENOUS
  Administered 2019-09-06: 4.5 mg via INTRAVENOUS
  Administered 2019-09-06: 3 mg via INTRAVENOUS
  Administered 2019-09-07 (×2): 1.5 mg via INTRAVENOUS
  Administered 2019-09-07: 3 mg via INTRAVENOUS
  Administered 2019-09-07: 30 mg via INTRAVENOUS
  Administered 2019-09-07: 7.5 mg via INTRAVENOUS
  Administered 2019-09-07: 0.5 mg via INTRAVENOUS
  Administered 2019-09-07: 4 mg via INTRAVENOUS
  Administered 2019-09-08: 9.5 mg via INTRAVENOUS
  Administered 2019-09-08: 5.5 mg via INTRAVENOUS
  Administered 2019-09-08: 4.5 mg via INTRAVENOUS
  Administered 2019-09-08: 3.5 mg via INTRAVENOUS
  Administered 2019-09-08: 30 mg via INTRAVENOUS
  Administered 2019-09-08: 3 mg via INTRAVENOUS
  Administered 2019-09-09: 9 mg via INTRAVENOUS
  Filled 2019-09-05 (×7): qty 30

## 2019-09-05 MED ORDER — SENNOSIDES-DOCUSATE SODIUM 8.6-50 MG PO TABS
1.0000 | ORAL_TABLET | Freq: Two times a day (BID) | ORAL | Status: DC
  Administered 2019-09-07 – 2019-09-09 (×4): 1 via ORAL
  Filled 2019-09-05 (×9): qty 1

## 2019-09-05 MED ORDER — SODIUM CHLORIDE 0.9% FLUSH: 9.0000 mL | INTRAVENOUS | Status: DC | PRN

## 2019-09-05 MED ORDER — SODIUM CHLORIDE 0.9 % IV SOLN
25.0000 mg | INTRAVENOUS | Status: DC | PRN
  Filled 2019-09-05: qty 0.5

## 2019-09-05 MED ORDER — NALOXONE HCL 0.4 MG/ML IJ SOLN: 0.4000 mg | INTRAMUSCULAR | Status: DC | PRN

## 2019-09-05 MED ORDER — FOLIC ACID 1 MG PO TABS
1.0000 mg | ORAL_TABLET | Freq: Every day | ORAL | Status: DC
  Administered 2019-09-06 – 2019-09-10 (×5): 1 mg via ORAL
  Filled 2019-09-05 (×5): qty 1

## 2019-09-05 MED ORDER — POLYETHYLENE GLYCOL 3350 17 G PO PACK: 17.0000 g | PACK | Freq: Every day | ORAL | Status: DC | PRN

## 2019-09-05 MED ORDER — DIPHENHYDRAMINE HCL 25 MG PO CAPS
25.0000 mg | ORAL_CAPSULE | ORAL | Status: DC | PRN
  Administered 2019-09-05 (×2): 25 mg via ORAL
  Filled 2019-09-05 (×3): qty 1

## 2019-09-05 MED ORDER — KETOROLAC TROMETHAMINE 30 MG/ML IJ SOLN
15.0000 mg | Freq: Four times a day (QID) | INTRAMUSCULAR | Status: DC
  Administered 2019-09-05 – 2019-09-07 (×7): 15 mg via INTRAMUSCULAR
  Filled 2019-09-05 (×8): qty 1

## 2019-09-05 MED ORDER — HYDROMORPHONE HCL 1 MG/ML IJ SOLN
1.0000 mg | INTRAMUSCULAR | Status: AC | PRN
  Administered 2019-09-05 (×3): 1 mg via INTRAVENOUS
  Filled 2019-09-05 (×3): qty 1

## 2019-09-05 MED ORDER — OXYCODONE HCL ER 20 MG PO T12A
20.0000 mg | EXTENDED_RELEASE_TABLET | Freq: Two times a day (BID) | ORAL | Status: DC
  Administered 2019-09-05 – 2019-09-10 (×10): 20 mg via ORAL
  Filled 2019-09-05 (×10): qty 1

## 2019-09-05 MED ORDER — OXYCODONE-ACETAMINOPHEN 10-325 MG PO TABS: 1.0000 | ORAL_TABLET | ORAL | Status: DC | PRN

## 2019-09-05 MED ORDER — ENOXAPARIN SODIUM 40 MG/0.4ML ~~LOC~~ SOLN: 40.0000 mg | SUBCUTANEOUS | Status: DC

## 2019-09-05 NOTE — ED Notes (Signed)
Patient requesting pain medication. Hospitalist paged.

## 2019-09-05 NOTE — ED Notes (Signed)
Message sent to pharmacy to verify pain medication.

## 2019-09-05 NOTE — H&P (Signed)
H&P  Patient Demographics:  Timothy Marks, is a 29 y.o. male  MRN: [id number]   DOB - [birth year]  Admit Date - 09/04/2019  Outpatient Primary MD for the patient is Vevelyn Francois, NP    HPI:   Timothy Marks  is a 29 y.o. 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  He presented to the emergency room last night with generalized body pain but mostly in his lower back and his left lower limb, no report of fall or any injury, no joint swelling or redness.  He reported no significant aggravating factor, slightly relieved with his home pain medications but the relief was not sustained.  Patient was previously seen at the day hospital for extended pain management on 09/02/2019.  His current pain intensity is about 8/10, characterized as constant and throbbing.  He denies any fever, cough, chest pain, shortness of breath, urinary symptoms, nausea, vomiting or diarrhea.  He denies any sick contact, recent travel or exposure to COVID-19.  Patient has been screened and he tested negative for COVID-19 today.  ED course Patient recorded vital signs are: BP 114/66 (BP Location: Left Arm)   Pulse 78   Temp 99.1 F (37.3 C)   Resp (!) 23   Ht 6\' 2"  (1.88 m)   Wt 54.4 kg   SpO2 100%   BMI 15.41 kg/m.  His hemoglobin was 7.8 which is consistent with his baseline, WBC count slightly elevated at 14.2, comprehensive metabolic panel was largely within normal limits or at his baseline.  His most recent chest x-ray was on 08/30/2019, which showed no acute disease.  He received multiple doses of IV Dilaudid in the emergency room with no relief of pain.  Patient is being admitted for further  evaluation and for sickle cell pain management.   Review of systems:  In addition to the HPI above, patient reports No fever or chills No Headache, No changes with vision or hearing No problems swallowing food or liquids No chest pain, cough or shortness of breath No Abdominal pain, No Nausea or Vomiting, Bowel movements are regular No blood in stool or urine No dysuria No new skin rashes or bruises No new joints pains-aches No new weakness, tingling, numbness in any extremity No recent weight gain or loss No polyuria, polydypsia or polyphagia No significant Mental Stressors  A full 10 point Review of Systems was done, except as stated above, all other Review of Systems were negative.  With Past History of the following :   Past Medical History:  Diagnosis Date  . Depression   . Generalized weakness 03/31/2019  . 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 Sickle cell anemia (HCC)   . Vitamin D deficiency y-6      Past Surgical History:  Procedure Laterality Date  . CHOLECYSTECTOMY       Social History:   Social History   Tobacco Use  . Smoking status: Never Smoker  . Smokeless tobacco: Never Used  Substance Use Topics  . Alcohol use: No     Lives - At home   Family History :   Family History  Problem Relation Age of Onset  .  Sickle cell trait Mother   . Sickle cell trait Father      Home Medications:   Prior to Admission medications   Medication Sig Start Date End Date Taking? Authorizing Provider  acetaminophen (TYLENOL) 500 MG tablet Take 500-1,000 mg by mouth every 6 (six) hours as needed for mild pain or headache.   Yes [provider]  gabapentin (NEURONTIN) 300 MG capsule Take 1 capsule (300 mg total) by mouth 3 (three) times daily. Patient taking differently: Take 300 mg by mouth 2 (two) times daily.  03/12/19  Yes Dorena Dew, FNP  ibuprofen (ADVIL) 800 MG tablet TAKE 1  TABLET(800 MG) BY MOUTH THREE TIMES DAILY AS NEEDED FOR PAIN Patient taking differently: Take 800 mg by mouth 3 (three) times daily as needed for moderate pain.  09/03/19  Yes Vevelyn Francois, NP  oxyCODONE-acetaminophen (PERCOCET) 10-325 MG tablet Take 1 tablet by mouth every 6 (six) hours as needed for up to 15 days for pain. Patient taking differently: Take 1 tablet by mouth every 4 (four) hours as needed for pain.  08/29/19 09/13/19 Yes King, Diona Foley, NP  OXYCONTIN 20 MG 12 hr tablet Take 1 tablet (20 mg total) by mouth every 12 (twelve) hours. 09/14/19 10/14/19 Yes Vevelyn Francois, NP  cholecalciferol (VITAMIN D) 1000 units tablet Take 2 tablets (2,000 Units total) by mouth daily. Patient not taking: Reported on 08/30/2019 12/13/17   Azzie Glatter, FNP  folic acid (FOLVITE) 1 MG tablet Take 1 tablet (1 mg total) by mouth daily. Patient not taking: Reported on 08/30/2019 05/01/17   Dorena Dew, FNP  hydroxyurea (HYDREA) 500 MG capsule Take 1 capsule (500 mg total) by mouth 2 (two) times daily. May take with food to minimize GI side effects. Patient not taking: Reported on 08/30/2019 05/01/17   Dorena Dew, FNP  ibuprofen (ADVIL) 800 MG tablet TAKE 1 TABLET(800 MG) BY MOUTH THREE TIMES DAILY AS NEEDED FOR PAIN Patient not taking: Reported on 09/05/2019 09/03/19   Vevelyn Francois, NP  Nutritional Supplements (ENSURE ACTIVE HIGH PROTEIN) LIQD Take 1 each by mouth 3 (three) times daily between meals. #90 cans of Ensure Patient not taking: Reported on 08/30/2019 11/13/18   Dorena Dew, FNP     Allergies:   Allergies  Allergen Reactions  . Buprenorphine Hcl Hives  . Levaquin [Levofloxacin] Itching  . Meperidine Rash  . Morphine Hives  . Morphine And Related Hives  . Toradol [Ketorolac Tromethamine] Itching  . Tramadol Hives  . Vancomycin Itching  . Zosyn [Piperacillin Sod-Tazobactam So] Itching, Rash and Other (See Comments)    Has taken rocephin in the past     Physical  Exam:   Vitals:   Vitals:   09/05/19 1149 09/05/19 1238  BP:  97/66  Pulse:  67  Resp: 16 15  Temp:  98.1 F (36.7 C)  SpO2:  100%   Physical Exam: Constitutional: Patient appears well-developed and well-nourished. Not in obvious distress.  Thin built HENT: Normocephalic, atraumatic, External right and left ear normal. Oropharynx is clear and moist.  Eyes: Conjunctivae and EOM are normal. PERRLA, no scleral icterus. Neck: Normal ROM. Neck supple. No JVD. No tracheal deviation. No thyromegaly. CVS: RRR, S1/S2 +, no murmurs, no gallops, no carotid bruit.  Pulmonary: Effort and breath sounds normal, no stridor, rhonchi, wheezes, rales.  Abdominal: Soft. BS +, no distension, tenderness, rebound or guarding.  Musculoskeletal: Normal range of motion. No edema and no tenderness.  Lymphadenopathy: No  lymphadenopathy noted, cervical, inguinal or axillary Neuro: Alert. Normal reflexes, muscle tone coordination. No cranial nerve deficit. Skin: Skin is warm and dry. No rash noted. Not diaphoretic. No erythema. No pallor. Psychiatric: Normal mood and affect. Behavior, judgment, thought content normal.   Data Review:   CBC Recent Labs  Lab 08/30/19 1701 09/02/19 1045 09/04/19 2300  WBC 15.5* 15.3* 14.2*  HGB 9.5* 8.6* 7.8*  HCT 27.7* 25.0* 23.3*  PLT 489* 421* 560*  MCV 86.3 85.9 84.7  MCH 29.6 29.6 28.4  MCHC 34.3 34.4 33.5  RDW 16.7* 16.2* 18.5*  LYMPHSABS 1.6 1.4 2.7  MONOABS 1.2* 1.2* 1.2*  EOSABS 0.2 0.0 0.1  BASOSABS 0.0 0.0 0.0   ------------------------------------------------------------------------------------------------------------------  Chemistries  Recent Labs  Lab 08/30/19 1701 09/02/19 1045 09/04/19 2300  NA 137 139 138  K 3.6 3.8 3.9  CL 105 101 102  CO2 23 26 24   GLUCOSE 113* 90 95  BUN <5* 13 9  CREATININE 0.64 0.50* 0.58*  CALCIUM 9.0 9.4 9.5  AST 26 19 18   ALT 11 13 11   ALKPHOS 67 73 68  BILITOT 2.6* 2.7* 1.8*    ------------------------------------------------------------------------------------------------------------------ estimated creatinine clearance is 103.9 mL/min (A) (by C-G formula based on SCr of 0.58 mg/dL (L)). ------------------------------------------------------------------------------------------------------------------ No results for input(s): TSH, T4TOTAL, T3FREE, THYROIDAB in the last 72 hours.  Invalid input(s): FREET3  Coagulation profile No results for input(s): INR, PROTIME in the last 168 hours. ------------------------------------------------------------------------------------------------------------------- No results for input(s): DDIMER in the last 72 hours. -------------------------------------------------------------------------------------------------------------------  Cardiac Enzymes No results for input(s): CKMB, TROPONINI, MYOGLOBIN in the last 168 hours.  Invalid input(s): CK ------------------------------------------------------------------------------------------------------------------ No results found for: BNP  ---------------------------------------------------------------------------------------------------------------  Urinalysis    Component Value Date/Time   COLORURINE YELLOW 03/18/2019 [place] 03/18/2019 1236   LABSPEC 1.010 03/18/2019 1236   PHURINE 9.0 (H) 03/18/2019 1236   GLUCOSEU NEGATIVE 03/18/2019 1236   HGBUR NEGATIVE 03/18/2019 1236   BILIRUBINUR neg 08/26/2019 0945   KETONESUR negative 06/14/2019 0908   KETONESUR NEGATIVE 03/18/2019 1236   PROTEINUR Positive (A) 08/26/2019 0945   PROTEINUR NEGATIVE 03/18/2019 1236   UROBILINOGEN 1.0 08/26/2019 0945   UROBILINOGEN 2.0 (H) 05/01/2017 0955   NITRITE neg 08/26/2019 0945   NITRITE NEGATIVE 03/18/2019 1236   LEUKOCYTESUR Negative 08/26/2019 0945   LEUKOCYTESUR NEGATIVE 03/18/2019 1236     ----------------------------------------------------------------------------------------------------------------   Imaging Results:    Assessment & Plan:  Active Problems:   Sickle cell pain crisis (HCC)   Adjustment disorder with mixed disturbance of emotions and conduct   Chronic pain syndrome   Acute bilateral low back pain without sciatica  1. Hb Sickle Cell Disease with crisis: Admit, start IVF D5 .45% Saline @ 125 mls/hour, start weight based Dilaudid PCA at 0 point 5/10/3 setting, start IV Toradol 15 mg Q 6 H for total of 5 days, and home pain medications, monitor vitals very closely, Re-evaluate pain scale regularly, 2 L of Oxygen by [place], Patient will be re-evaluated for pain in the context of function and relationship to baseline as care progresses. 2. Leukocytosis: Most likely reactive, no clinical evidence of infection or inflammation.  We will continue to monitor without antibiotics. 3. Sickle Cell Anemia: Hemoglobin is stable at baseline.  Will watch out for hemodilution.  For now no clinical indication for blood transfusion. 4. Chronic pain Syndrome: Restart and continue home pain medications. 5. Adjustment disorder with major depression: Continue home medications.  DVT Prophylaxis: Subcut Lovenox   AM Labs Ordered, also  please review Full Orders  Family Communication: Admission, patient's condition and plan of care including tests being ordered have been discussed with the patient who indicate understanding and agree with the plan and Code Status.  Code Status: Full Code  Consults called: None    Admission status: Inpatient    Time spent in minutes : 50 minutes  Angelica Chessman MD, MHA, CPE, FACP 09/05/2019 at 2:37 PM

## 2019-09-05 NOTE — ED Notes (Signed)
Report to floor

## 2019-09-06 MED ORDER — POLYVINYL ALCOHOL 1.4 % OP SOLN
1.0000 [drp] | OPHTHALMIC | Status: DC | PRN
  Filled 2019-09-06: qty 15

## 2019-09-06 NOTE — Progress Notes (Signed)
Patient ID: Timothy Marks, male   DOB: Jan 17, 1991, 29 y.o.   MRN: [id number] Subjective: Timothy Marks, a 29 year old male with a medical history significant for sickle cell disease, chronic pain syndrome, opiate dependence and tolerance, and history of oppositional defiance disorder was admitted for sickle cell pain crisis.  Patient has no new complaint today, still having significant pain in his left leg, mostly at the shin area, no swelling or redness, no history of trauma. Patient rates his pain at 8/10. He denies fever, headache, chest pain, SOB, urinary symptoms, nausea, vomiting or diarrhea.  Objective:  Vital signs in last 24 hours:  Vitals:   09/06/19 0809 09/06/19 1008 09/06/19 1202 09/06/19 1344  BP:  (!) 107/54  (!) 100/60  Pulse:  92  98  Resp: 17 14 17 16   Temp:  98.7 F (37.1 C)  98.7 F (37.1 C)  TempSrc:  Oral  Oral  SpO2:  98%  96%  Weight:      Height:       Intake/Output from previous day:  Intake/Output Summary (Last 24 hours) at 09/06/2019 1606 Last data filed at 09/06/2019 1500 Gross per 24 hour  Intake 2508.47 ml  Output 950 ml  Net 1558.47 ml   Physical Exam: General: Alert, awake, oriented x3, in no acute distress.  HEENT: [place]/AT PEERL, EOMI Neck: Trachea midline,  no masses, no thyromegal,y no JVD, no carotid bruit OROPHARYNX:  Moist, No exudate/ erythema/lesions.  Heart: Regular rate and rhythm, without murmurs, rubs, gallops, PMI non-displaced, no heaves or thrills on palpation.  Lungs: Clear to auscultation, no wheezing or rhonchi noted. No increased vocal fremitus resonant to percussion  Abdomen: Soft, nontender, nondistended, positive bowel sounds, no masses no hepatosplenomegaly noted..  Neuro: No focal neurological deficits noted cranial nerves II through XII grossly intact. DTRs 2+ bilaterally upper and lower extremities. Strength 5 out of 5 in bilateral upper and lower extremities. Musculoskeletal: No warm swelling or erythema around joints,  no spinal tenderness noted. Psychiatric: Patient alert and oriented x3, good insight and cognition, good recent to remote recall. Lymph node survey: No cervical axillary or inguinal lymphadenopathy noted.  Lab Results:  Basic Metabolic Panel:    Component Value Date/Time   NA 138 09/04/2019 2300   NA 139 08/31/2018 0854   K 3.9 09/04/2019 2300   CL 102 09/04/2019 2300   CO2 24 09/04/2019 2300   BUN 9 09/04/2019 2300   BUN 6 08/31/2018 0854   CREATININE 0.58 (L) 09/04/2019 2300   GLUCOSE 95 09/04/2019 2300   CALCIUM 9.5 09/04/2019 2300   CBC:    Component Value Date/Time   WBC 14.2 (H) 09/04/2019 2300   HGB 7.8 (L) 09/04/2019 2300   HGB 8.0 (L) 11/13/2018 1408   HCT 23.3 (L) 09/04/2019 2300   HCT 25.1 (L) 11/13/2018 1408   PLT 560 (H) 09/04/2019 2300   PLT 792 (H) 11/13/2018 1408   MCV 84.7 09/04/2019 2300   MCV 95 11/13/2018 1408   NEUTROABS 10.1 (H) 09/04/2019 2300   NEUTROABS 5.5 11/13/2018 1408   LYMPHSABS 2.7 09/04/2019 2300   LYMPHSABS 2.9 11/13/2018 1408   MONOABS 1.2 (H) 09/04/2019 2300   EOSABS 0.1 09/04/2019 2300   EOSABS 0.0 11/13/2018 1408   BASOSABS 0.0 09/04/2019 2300   BASOSABS 0.0 11/13/2018 1408   Recent Results (from the past 240 hour(s))  SARS Coronavirus 2 by RT PCR (hospital order, performed in [facility] hospital lab) Nasopharyngeal Nasopharyngeal Swab     Status: None  Collection Time: 08/30/19  7:41 PM   Specimen: Nasopharyngeal Swab  Result Value Ref Range Status   SARS Coronavirus 2 NEGATIVE NEGATIVE Final    Comment: (NOTE) SARS-CoV-2 target nucleic acids are NOT DETECTED.  The SARS-CoV-2 RNA is generally detectable in upper and lower respiratory specimens during the acute phase of infection. The lowest concentration of SARS-CoV-2 viral copies this assay can detect is 250 copies / mL. A negative result does not preclude SARS-CoV-2 infection and should not be used as the sole basis for treatment or other patient management decisions.   A negative result may occur with improper specimen collection / handling, submission of specimen other than nasopharyngeal swab, presence of viral mutation(s) within the areas targeted by this assay, and inadequate number of viral copies (<250 copies / mL). A negative result must be combined with clinical observations, patient history, and epidemiological information.  Fact Sheet for Patients:   StrictlyIdeas.no  Fact Sheet for Healthcare Providers: BankingDealers.co.za  This test is not yet approved or  cleared by the [place] FDA and has been authorized for detection and/or diagnosis of SARS-CoV-2 by FDA under an Emergency Use Authorization (EUA).  This EUA will remain in effect (meaning this test can be used) for the duration of the COVID-19 declaration under Section 564(b)(1) of the Act, 21 U.S.C. section 360bbb-3(b)(1), unless the authorization is terminated or revoked sooner.  Performed at [facility], [place] [address], [place], [place] [address]   SARS Coronavirus 2 by RT PCR (hospital order, performed in [facility] hospital lab) Nasopharyngeal Nasopharyngeal Swab     Status: None   Collection Time: 09/04/19 11:33 PM   Specimen: Nasopharyngeal Swab  Result Value Ref Range Status   SARS Coronavirus 2 NEGATIVE NEGATIVE Final    Comment: (NOTE) SARS-CoV-2 target nucleic acids are NOT DETECTED.  The SARS-CoV-2 RNA is generally detectable in upper and lower respiratory specimens during the acute phase of infection. The lowest concentration of SARS-CoV-2 viral copies this assay can detect is 250 copies / mL. A negative result does not preclude SARS-CoV-2 infection and should not be used as the sole basis for treatment or other patient management decisions.  A negative result may occur with improper specimen collection / handling, submission of specimen other than nasopharyngeal swab, presence of viral mutation(s) within  the areas targeted by this assay, and inadequate number of viral copies (<250 copies / mL). A negative result must be combined with clinical observations, patient history, and epidemiological information.  Fact Sheet for Patients:   StrictlyIdeas.no  Fact Sheet for Healthcare Providers: BankingDealers.co.za  This test is not yet approved or  cleared by the [place] FDA and has been authorized for detection and/or diagnosis of SARS-CoV-2 by FDA under an Emergency Use Authorization (EUA).  This EUA will remain in effect (meaning this test can be used) for the duration of the COVID-19 declaration under Section 564(b)(1) of the Act, 21 U.S.C. section 360bbb-3(b)(1), unless the authorization is terminated or revoked sooner.  Performed at [facility], [place] [address], [place], [place] [address]     Studies/Results: No results found.  Medications: Scheduled Meds: . enoxaparin (LOVENOX) injection  40 mg Subcutaneous Q24H  . folic acid  1 mg Oral Daily  . gabapentin  300 mg Oral TID  . HYDROmorphone   Intravenous Q4H  . hydroxyurea  500 mg Oral BID  . ketorolac  15 mg Intramuscular Q6H  . oxyCODONE  20 mg Oral Q12H  . senna-docusate  1 tablet Oral BID   Continuous Infusions: . dextrose 5 % and 0.45% NaCl 100 mL/hr at 09/05/19 1908  . diphenhydrAMINE     PRN Meds:.acetaminophen, diphenhydrAMINE **OR** diphenhydrAMINE, naloxone **AND** sodium chloride flush, ondansetron, oxyCODONE-acetaminophen **AND** oxyCODONE, polyethylene glycol, polyvinyl alcohol  Consultants:  None  Procedures:  None  Antibiotics:  None  Assessment/Plan: Active Problems:   Sickle cell pain crisis (HCC)   Adjustment disorder with mixed disturbance of emotions and conduct   Chronic pain syndrome   Acute bilateral low back pain without sciatica  1. Hb Sickle Cell Disease with crisis: Reduce IVF D5 .45% Saline to 50 mls/hour,  continue weight based Dilaudid PCA, continue IV Toradol 15 mg Q 6 H for total of 5 days, continue home oral pain medications, monitor vitals very closely, Re-evaluate pain scale regularly, 2 L of Oxygen by [place]. 2. Sickle Cell Anemia: Hemoglobin is stable at baseline, no clinical indication for blood transfusion today. 3. Chronic pain Syndrome: Restart and continue home pain medications. 4. History of oppositional defiance disorder: Patient has been reported to be very abusive to clinical staff, controlling and occasional agitations.  Patient usually gets Haldol 1 mg as needed agitation, this is still a possibility if patient continues to have agitation.  Patient has been educated and counseled extensively today.  Code Status: Full Code Family Communication: N/A Disposition Plan: Not yet ready for discharge  Cozy Veale  If 7PM-7AM, please contact night-coverage.  09/06/2019, 4:06 PM  LOS: 1 day

## 2019-09-06 NOTE — Progress Notes (Signed)
Pt refused lab draw this morning. This RN heard him state to the phlebotomist "I'm a tiger, get out of my room, what are you looking at". 2nd phlebotomist also unsuccessful in convincing pt to allow labs. Hortencia Conradi RN

## 2019-09-07 LAB — CBC WITH DIFFERENTIAL/PLATELET
Abs Immature Granulocytes: 0.07 10*3/uL (ref 0.00–0.07)
Basophils Absolute: 0.1 10*3/uL (ref 0.0–0.1)
Basophils Relative: 0 %
Eosinophils Absolute: 0.3 10*3/uL (ref 0.0–0.5)
Eosinophils Relative: 2 %
HCT: 18.1 % — ABNORMAL LOW (ref 39.0–52.0)
Hemoglobin: 6 g/dL — CL (ref 13.0–17.0)
Immature Granulocytes: 0 %
Lymphocytes Relative: 14 %
Lymphs Abs: 2.5 10*3/uL (ref 0.7–4.0)
MCH: 28.2 pg (ref 26.0–34.0)
MCHC: 33.1 g/dL (ref 30.0–36.0)
MCV: 85 fL (ref 80.0–100.0)
Monocytes Absolute: 0.9 10*3/uL (ref 0.1–1.0)
Monocytes Relative: 5 %
Neutro Abs: 13.8 10*3/uL — ABNORMAL HIGH (ref 1.7–7.7)
Neutrophils Relative %: 79 %
Platelets: 744 10*3/uL — ABNORMAL HIGH (ref 150–400)
RBC: 2.13 MIL/uL — ABNORMAL LOW (ref 4.22–5.81)
RDW: 22 % — ABNORMAL HIGH (ref 11.5–15.5)
WBC: 17.6 10*3/uL — ABNORMAL HIGH (ref 4.0–10.5)
nRBC: 1.5 % — ABNORMAL HIGH (ref 0.0–0.2)

## 2019-09-07 LAB — RETICULOCYTES
Immature Retic Fract: 32 % — ABNORMAL HIGH (ref 2.3–15.9)
RBC.: 2.13 MIL/uL — ABNORMAL LOW (ref 4.22–5.81)
Retic Count, Absolute: 70.8 10*3/uL (ref 19.0–186.0)
Retic Ct Pct: 16.1 % — ABNORMAL HIGH (ref 0.4–3.1)

## 2019-09-07 LAB — COMPREHENSIVE METABOLIC PANEL
ALT: 11 U/L (ref 0–44)
AST: 24 U/L (ref 15–41)
Albumin: 4 g/dL (ref 3.5–5.0)
Alkaline Phosphatase: 73 U/L (ref 38–126)
Anion gap: 9 (ref 5–15)
BUN: 8 mg/dL (ref 6–20)
CO2: 28 mmol/L (ref 22–32)
Calcium: 9 mg/dL (ref 8.9–10.3)
Chloride: 102 mmol/L (ref 98–111)
Creatinine, Ser: 0.56 mg/dL — ABNORMAL LOW (ref 0.61–1.24)
GFR calc Af Amer: >60 mL/min (ref >60)
GFR calc non Af Amer: >60 mL/min (ref >60)
Glucose, Bld: 86 mg/dL (ref 70–99)
Potassium: 3.5 mmol/L (ref 3.5–5.1)
Sodium: 139 mmol/L (ref 135–145)
Total Bilirubin: 4.2 mg/dL — ABNORMAL HIGH (ref 0.3–1.2)
Total Protein: 8.1 g/dL (ref 6.5–8.1)

## 2019-09-07 LAB — PREPARE RBC (CROSSMATCH)

## 2019-09-07 MED ORDER — KETOROLAC TROMETHAMINE 30 MG/ML IJ SOLN
15.0000 mg | Freq: Four times a day (QID) | INTRAMUSCULAR | Status: AC
  Administered 2019-09-07 – 2019-09-10 (×12): 15 mg via INTRAVENOUS
  Filled 2019-09-07 (×11): qty 1

## 2019-09-07 NOTE — Progress Notes (Signed)
PHARMACY BRIEF NOTE: HYDROXYUREA   By [facility] policy, hydroxyurea is automatically held when any of the following laboratory values occur:  ANC < 2 K  Pltc < 80K in sickle-cell patients; < 100K in other patients  Hgb <= 6 in sickle-cell patients; < 8 in other patients  Reticulocytes < 80K when Hgb < 9  Hydroxyurea has been held (discontinued from profile) per policy.   Reuel Boom, PharmD, BCPS [phone] 09/07/2019, 3:52 PM

## 2019-09-07 NOTE — Progress Notes (Signed)
Patient ID: Timothy Marks, male   DOB: 1990/12/14, 29 y.o.   MRN: [id number] Subjective: Timothy Marks, a 29 year old male with a medical history significant for sickle cell disease, chronic pain syndrome, opiate dependence and tolerance, and history of oppositional defiance disorder was admitted for sickle cell pain crisis.  Patient patient reported no new symptoms today, says the pain in his leg is much improved. His pain is now at 6/10. He denies any fever, cough, shortness of breath, chest pain, nausea, vomiting or diarrhea.  No urinary symptoms.  Patient's hemoglobin has dropped below baseline today. Patient is asymptomatic.  Objective:  Vital signs in last 24 hours:  Vitals:   09/07/19 0813 09/07/19 0930 09/07/19 1156 09/07/19 1316  BP:  113/73  (!) 107/60  Pulse:  100  95  Resp: 16 16 14 16   Temp:  97.9 F (36.6 C)    TempSrc:  Oral    SpO2: 97% 99% 91% 95%  Weight:      Height:       Intake/Output from previous day:  Intake/Output Summary (Last 24 hours) at 09/07/2019 1500 Last data filed at 09/07/2019 1230 Gross per 24 hour  Intake --  Output 2150 ml  Net -2150 ml   Physical Exam: General: Alert, awake, oriented x3, in no acute distress.  Thin built HEENT: [place]/AT PEERL, EOMI Neck: Trachea midline,  no masses, no thyromegal,y no JVD, no carotid bruit OROPHARYNX:  Moist, No exudate/ erythema/lesions.  Heart: Regular rate and rhythm, without murmurs, rubs, gallops, PMI non-displaced, no heaves or thrills on palpation.  Lungs: Clear to auscultation, no wheezing or rhonchi noted. No increased vocal fremitus resonant to percussion  Abdomen: Soft, nontender, nondistended, positive bowel sounds, no masses no hepatosplenomegaly noted..  Neuro: No focal neurological deficits noted cranial nerves II through XII grossly intact. DTRs 2+ bilaterally upper and lower extremities. Strength 5 out of 5 in bilateral upper and lower extremities. Musculoskeletal: No warm swelling or erythema  around joints, no spinal tenderness noted. Psychiatric: Patient alert and oriented x3, good insight and cognition, good recent to remote recall. Lymph node survey: No cervical axillary or inguinal lymphadenopathy noted.  Lab Results:  Basic Metabolic Panel:    Component Value Date/Time   NA 139 09/07/2019 0936   NA 139 08/31/2018 0854   K 3.5 09/07/2019 0936   CL 102 09/07/2019 0936   CO2 28 09/07/2019 0936   BUN 8 09/07/2019 0936   BUN 6 08/31/2018 0854   CREATININE 0.56 (L) 09/07/2019 0936   GLUCOSE 86 09/07/2019 0936   CALCIUM 9.0 09/07/2019 0936   CBC:    Component Value Date/Time   WBC 17.6 (H) 09/07/2019 0936   HGB 6.0 (LL) 09/07/2019 0936   HGB 8.0 (L) 11/13/2018 1408   HCT 18.1 (L) 09/07/2019 0936   HCT 25.1 (L) 11/13/2018 1408   PLT 744 (H) 09/07/2019 0936   PLT 792 (H) 11/13/2018 1408   MCV 85.0 09/07/2019 0936   MCV 95 11/13/2018 1408   NEUTROABS 13.8 (H) 09/07/2019 0936   NEUTROABS 5.5 11/13/2018 1408   LYMPHSABS 2.5 09/07/2019 0936   LYMPHSABS 2.9 11/13/2018 1408   MONOABS 0.9 09/07/2019 0936   EOSABS 0.3 09/07/2019 0936   EOSABS 0.0 11/13/2018 1408   BASOSABS 0.1 09/07/2019 0936   BASOSABS 0.0 11/13/2018 1408   Recent Results (from the past 240 hour(s))  SARS Coronavirus 2 by RT PCR (hospital order, performed in [facility] hospital lab) Nasopharyngeal Nasopharyngeal Swab     Status: None  Collection Time: 08/30/19  7:41 PM   Specimen: Nasopharyngeal Swab  Result Value Ref Range Status   SARS Coronavirus 2 NEGATIVE NEGATIVE Final    Comment: (NOTE) SARS-CoV-2 target nucleic acids are NOT DETECTED.  The SARS-CoV-2 RNA is generally detectable in upper and lower respiratory specimens during the acute phase of infection. The lowest concentration of SARS-CoV-2 viral copies this assay can detect is 250 copies / mL. A negative result does not preclude SARS-CoV-2 infection and should not be used as the sole basis for treatment or other patient management  decisions.  A negative result may occur with improper specimen collection / handling, submission of specimen other than nasopharyngeal swab, presence of viral mutation(s) within the areas targeted by this assay, and inadequate number of viral copies (<250 copies / mL). A negative result must be combined with clinical observations, patient history, and epidemiological information.  Fact Sheet for Patients:   StrictlyIdeas.no  Fact Sheet for Healthcare Providers: BankingDealers.co.za  This test is not yet approved or  cleared by the [place] FDA and has been authorized for detection and/or diagnosis of SARS-CoV-2 by FDA under an Emergency Use Authorization (EUA).  This EUA will remain in effect (meaning this test can be used) for the duration of the COVID-19 declaration under Section 564(b)(1) of the Act, 21 U.S.C. section 360bbb-3(b)(1), unless the authorization is terminated or revoked sooner.  Performed at [facility], [place] [address] [place], [place] [address]   SARS Coronavirus 2 by RT PCR (hospital order, performed in [facility] hospital lab) Nasopharyngeal Nasopharyngeal Swab     Status: None   Collection Time: 09/04/19 11:33 PM   Specimen: Nasopharyngeal Swab  Result Value Ref Range Status   SARS Coronavirus 2 NEGATIVE NEGATIVE Final    Comment: (NOTE) SARS-CoV-2 target nucleic acids are NOT DETECTED.  The SARS-CoV-2 RNA is generally detectable in upper and lower respiratory specimens during the acute phase of infection. The lowest concentration of SARS-CoV-2 viral copies this assay can detect is 250 copies / mL. A negative result does not preclude SARS-CoV-2 infection and should not be used as the sole basis for treatment or other patient management decisions.  A negative result may occur with improper specimen collection / handling, submission of specimen other than nasopharyngeal swab, presence of viral  mutation(s) within the areas targeted by this assay, and inadequate number of viral copies (<250 copies / mL). A negative result must be combined with clinical observations, patient history, and epidemiological information.  Fact Sheet for Patients:   StrictlyIdeas.no  Fact Sheet for Healthcare Providers: BankingDealers.co.za  This test is not yet approved or  cleared by the [place] FDA and has been authorized for detection and/or diagnosis of SARS-CoV-2 by FDA under an Emergency Use Authorization (EUA).  This EUA will remain in effect (meaning this test can be used) for the duration of the COVID-19 declaration under Section 564(b)(1) of the Act, 21 U.S.C. section 360bbb-3(b)(1), unless the authorization is terminated or revoked sooner.  Performed at [facility], [place] [address] [place], [place] [address]     Studies/Results: No results found.  Medications: Scheduled Meds: . enoxaparin (LOVENOX) injection  40 mg Subcutaneous Q24H  . folic acid  1 mg Oral Daily  . gabapentin  300 mg Oral TID  . HYDROmorphone   Intravenous Q4H  . hydroxyurea  500 mg Oral BID  . ketorolac  15 mg Intravenous Q6H  . oxyCODONE  20 mg Oral Q12H  . senna-docusate  1 tablet Oral BID   Continuous Infusions: . dextrose 5 % and 0.45% NaCl 100 mL/hr at 09/05/19 1908  . diphenhydrAMINE     PRN Meds:.acetaminophen, diphenhydrAMINE **OR** diphenhydrAMINE, naloxone **AND** sodium chloride flush, ondansetron, oxyCODONE-acetaminophen **AND** oxyCODONE, polyethylene glycol, polyvinyl alcohol  Consultants:  None  Procedures:  None  Antibiotics:  None  Assessment/Plan: Active Problems:   Sickle cell pain crisis (HCC)   Adjustment disorder with mixed disturbance of emotions and conduct   Chronic pain syndrome   Acute bilateral low back pain without sciatica  1. Hb Sickle Cell Disease with crisis: Reduce IVF to [facility],  continue weight based Dilaudid PCA, continue IV Toradol 15 mg Q 6 H for total of 5 days, continue home oral pain medications, monitor vitals very closely, Re-evaluate pain scale regularly, 2 L of Oxygen by . 2. Sickle Cell Anemia: Hemoglobin has dropped to 6.0 which is below baseline today, will transfuse with 1 unit of packed red blood cell. Repeat labs in a.m.   3. Leukocytosis: White cell count is 17.6 today, likely reactive as patient has no evidence of infection or inflammation. We will monitor without antibiotics. 4. Chronic pain Syndrome: Continue home pain medications. 5. History of oppositional defiance disorder: Patient is doing much better today, has not been abusive as before. Patient has been educated and counseled extensively again today. We will continue to monitor closely.  Code Status: Full Code Family Communication: N/A Disposition Plan: Not yet ready for discharge  Keyle Doby  If 7PM-7AM, please contact night-coverage.  09/07/2019, 3:00 PM  LOS: 2 days

## 2019-09-08 MED ORDER — SODIUM CHLORIDE 0.9 % IV SOLN: INTRAVENOUS | Status: DC | PRN

## 2019-09-08 MED ORDER — HYDROMORPHONE HCL 1 MG/ML IJ SOLN
1.0000 mg | Freq: Once | INTRAMUSCULAR | Status: AC
  Administered 2019-09-08: 1 mg via INTRAVENOUS
  Filled 2019-09-08: qty 1

## 2019-09-08 MED ORDER — POTASSIUM CHLORIDE 20 MEQ PO PACK
40.0000 meq | PACK | Freq: Once | ORAL | Status: AC
  Administered 2019-09-08: 40 meq via ORAL
  Filled 2019-09-08: qty 2

## 2019-09-08 NOTE — Progress Notes (Signed)
Patient ID: Timothy Marks, male   DOB: 15-Jan-1991, 29 y.o.   MRN: [id number] Subjective: Timothy Marks, a 29 year old male with a medical history significant for sickle cell disease, chronic pain syndrome, opiate dependence and tolerance, and history of oppositional defiance disorder was admitted for sickle cell pain crisis.  Patient claimed his pain returned to full-fledged crisis this morning, he claimed he did not sleep well last night because of pain, current pain intensity is 10/10.  There is no joint swelling or redness.  He claimed he is not able to walk without significant pain.  He denies any fever, cough, chest pain, shortness of breath, nausea, vomiting or diarrhea.  Objective:  Vital signs in last 24 hours:  Vitals:   09/08/19 0517 09/08/19 0739 09/08/19 0900 09/08/19 1127  BP: (!) 113/58  (!) 100/60   Pulse: 78  87   Resp: 19 14 (!) 11 14  Temp: 97.7 F (36.5 C)  98.5 F (36.9 C)   TempSrc: Oral  Oral   SpO2: 100% 96%  97%  Weight:      Height:       Intake/Output from previous day:  Intake/Output Summary (Last 24 hours) at 09/08/2019 1301 Last data filed at 09/08/2019 1014 Gross per 24 hour  Intake 902.5 ml  Output 1925 ml  Net -1022.5 ml   Physical Exam: General: Alert, awake, oriented x3, in no acute distress.  Thin built HEENT: [place]/AT PEERL, EOMI Neck: Trachea midline,  no masses, no thyromegal,y no JVD, no carotid bruit OROPHARYNX:  Moist, No exudate/ erythema/lesions.  Heart: Regular rate and rhythm, without murmurs, rubs, gallops, PMI non-displaced, no heaves or thrills on palpation.  Lungs: Clear to auscultation, no wheezing or rhonchi noted. No increased vocal fremitus resonant to percussion  Abdomen: Soft, nontender, nondistended, positive bowel sounds, no masses no hepatosplenomegaly noted..  Neuro: No focal neurological deficits noted cranial nerves II through XII grossly intact. DTRs 2+ bilaterally upper and lower extremities. Strength 5 out of 5 in  bilateral upper and lower extremities. Musculoskeletal: No warm swelling or erythema around joints, no spinal tenderness noted. Psychiatric: Patient alert and oriented x3, good insight and cognition, good recent to remote recall. Lymph node survey: No cervical axillary or inguinal lymphadenopathy noted.  Lab Results:  Basic Metabolic Panel:    Component Value Date/Time   NA 139 09/07/2019 0936   NA 139 08/31/2018 0854   K 3.5 09/07/2019 0936   CL 102 09/07/2019 0936   CO2 28 09/07/2019 0936   BUN 8 09/07/2019 0936   BUN 6 08/31/2018 0854   CREATININE 0.56 (L) 09/07/2019 0936   GLUCOSE 86 09/07/2019 0936   CALCIUM 9.0 09/07/2019 0936   CBC:    Component Value Date/Time   WBC 17.6 (H) 09/07/2019 0936   HGB 6.0 (LL) 09/07/2019 0936   HGB 8.0 (L) 11/13/2018 1408   HCT 18.1 (L) 09/07/2019 0936   HCT 25.1 (L) 11/13/2018 1408   PLT 744 (H) 09/07/2019 0936   PLT 792 (H) 11/13/2018 1408   MCV 85.0 09/07/2019 0936   MCV 95 11/13/2018 1408   NEUTROABS 13.8 (H) 09/07/2019 0936   NEUTROABS 5.5 11/13/2018 1408   LYMPHSABS 2.5 09/07/2019 0936   LYMPHSABS 2.9 11/13/2018 1408   MONOABS 0.9 09/07/2019 0936   EOSABS 0.3 09/07/2019 0936   EOSABS 0.0 11/13/2018 1408   BASOSABS 0.1 09/07/2019 0936   BASOSABS 0.0 11/13/2018 1408   Recent Results (from the past 240 hour(s))  SARS Coronavirus 2 by RT PCR (  hospital order, performed in [facility] hospital lab) Nasopharyngeal Nasopharyngeal Swab     Status: None   Collection Time: 08/30/19  7:41 PM   Specimen: Nasopharyngeal Swab  Result Value Ref Range Status   SARS Coronavirus 2 NEGATIVE NEGATIVE Final    Comment: (NOTE) SARS-CoV-2 target nucleic acids are NOT DETECTED.  The SARS-CoV-2 RNA is generally detectable in upper and lower respiratory specimens during the acute phase of infection. The lowest concentration of SARS-CoV-2 viral copies this assay can detect is 250 copies / mL. A negative result does not preclude SARS-CoV-2  infection and should not be used as the sole basis for treatment or other patient management decisions.  A negative result may occur with improper specimen collection / handling, submission of specimen other than nasopharyngeal swab, presence of viral mutation(s) within the areas targeted by this assay, and inadequate number of viral copies (<250 copies / mL). A negative result must be combined with clinical observations, patient history, and epidemiological information.  Fact Sheet for Patients:   StrictlyIdeas.no  Fact Sheet for Healthcare Providers: BankingDealers.co.za  This test is not yet approved or  cleared by the [place] FDA and has been authorized for detection and/or diagnosis of SARS-CoV-2 by FDA under an Emergency Use Authorization (EUA).  This EUA will remain in effect (meaning this test can be used) for the duration of the COVID-19 declaration under Section 564(b)(1) of the Act, 21 U.S.C. section 360bbb-3(b)(1), unless the authorization is terminated or revoked sooner.  Performed at [facility], [place] [address] [place], [place] [address]   SARS Coronavirus 2 by RT PCR (hospital order, performed in [facility] hospital lab) Nasopharyngeal Nasopharyngeal Swab     Status: None   Collection Time: 09/04/19 11:33 PM   Specimen: Nasopharyngeal Swab  Result Value Ref Range Status   SARS Coronavirus 2 NEGATIVE NEGATIVE Final    Comment: (NOTE) SARS-CoV-2 target nucleic acids are NOT DETECTED.  The SARS-CoV-2 RNA is generally detectable in upper and lower respiratory specimens during the acute phase of infection. The lowest concentration of SARS-CoV-2 viral copies this assay can detect is 250 copies / mL. A negative result does not preclude SARS-CoV-2 infection and should not be used as the sole basis for treatment or other patient management decisions.  A negative result may occur with improper specimen  collection / handling, submission of specimen other than nasopharyngeal swab, presence of viral mutation(s) within the areas targeted by this assay, and inadequate number of viral copies (<250 copies / mL). A negative result must be combined with clinical observations, patient history, and epidemiological information.  Fact Sheet for Patients:   StrictlyIdeas.no  Fact Sheet for Healthcare Providers: BankingDealers.co.za  This test is not yet approved or  cleared by the [place] FDA and has been authorized for detection and/or diagnosis of SARS-CoV-2 by FDA under an Emergency Use Authorization (EUA).  This EUA will remain in effect (meaning this test can be used) for the duration of the COVID-19 declaration under Section 564(b)(1) of the Act, 21 U.S.C. section 360bbb-3(b)(1), unless the authorization is terminated or revoked sooner.  Performed at [facility], [place] [address], [place], [place] [address]     Studies/Results: No results found.  Medications: Scheduled Meds: . enoxaparin (LOVENOX) injection  40 mg Subcutaneous Q24H  . folic acid  1 mg Oral Daily  . gabapentin  300 mg Oral TID  . HYDROmorphone   Intravenous Q4H  . ketorolac  15 mg Intravenous Q6H  .  oxyCODONE  20 mg Oral Q12H  . senna-docusate  1 tablet Oral BID   Continuous Infusions: . sodium chloride    . dextrose 5 % and 0.45% NaCl 10 mL/hr at 09/07/19 1500  . diphenhydrAMINE     PRN Meds:.sodium chloride, acetaminophen, diphenhydrAMINE **OR** diphenhydrAMINE, naloxone **AND** sodium chloride flush, ondansetron, oxyCODONE-acetaminophen **AND** oxyCODONE, polyethylene glycol, polyvinyl alcohol  Consultants:  None  Procedures:  None  Antibiotics:  None  Assessment/Plan: Active Problems:   Sickle cell pain crisis (HCC)   Adjustment disorder with mixed disturbance of emotions and conduct   Chronic pain syndrome   Acute  bilateral low back pain without sciatica  1. Hb Sickle Cell Disease with crisis: Reduce IVF to [facility], continue weight based Dilaudid PCA, continue IV Toradol 15 mg Q 6 H for total of 5 days, continue home oral pain medications, monitor vitals very closely, Re-evaluate pain scale regularly, 2 L of Oxygen by [place]. 2. Sickle Cell Anemia: Patient is status post transfusion of 1 unit of packed red blood cell yesterday.  Patient has refused lab draw this morning some awaiting hemoglobin concentration post transfusion. Repeat labs in a.m.   3. Leukocytosis: Elevated White cell count is likely reactive as patient has no evidence of infection or inflammation. We will continue to monitor without antibiotics. 4. Chronic pain Syndrome: Continue home pain medications. 5. History of oppositional defiance disorder: Patient reported to be talking as if hallucinating, using curse words on people not present in the room. Patient has been educated and counseled extensively again today. We will continue to monitor closely.  Code Status: Full Code Family Communication: N/A Disposition Plan: Not yet ready for discharge  Juanette Urizar  If 7PM-7AM, please contact night-coverage.  09/08/2019, 1:01 PM  LOS: 3 days

## 2019-09-09 LAB — RETICULOCYTES
Immature Retic Fract: 38.1 % — ABNORMAL HIGH (ref 2.3–15.9)
RBC.: 2.3 MIL/uL — ABNORMAL LOW (ref 4.22–5.81)
Retic Count, Absolute: 283.4 10*3/uL — ABNORMAL HIGH (ref 19.0–186.0)
Retic Ct Pct: 12.3 % — ABNORMAL HIGH (ref 0.4–3.1)

## 2019-09-09 LAB — CBC
HCT: 19.4 % — ABNORMAL LOW (ref 39.0–52.0)
Hemoglobin: 6.7 g/dL — CL (ref 13.0–17.0)
MCH: 28.6 pg (ref 26.0–34.0)
MCHC: 34.5 g/dL (ref 30.0–36.0)
MCV: 82.9 fL (ref 80.0–100.0)
Platelets: 762 10*3/uL — ABNORMAL HIGH (ref 150–400)
RBC: 2.34 MIL/uL — ABNORMAL LOW (ref 4.22–5.81)
RDW: 20.8 % — ABNORMAL HIGH (ref 11.5–15.5)
WBC: 13.4 10*3/uL — ABNORMAL HIGH (ref 4.0–10.5)
nRBC: 6.7 % — ABNORMAL HIGH (ref 0.0–0.2)

## 2019-09-09 LAB — PREPARE RBC (CROSSMATCH)

## 2019-09-09 LAB — HEMOGLOBIN AND HEMATOCRIT, BLOOD
HCT: 23.8 % — ABNORMAL LOW (ref 39.0–52.0)
Hemoglobin: 7.9 g/dL — ABNORMAL LOW (ref 13.0–17.0)

## 2019-09-09 MED ORDER — HYDROMORPHONE 1 MG/ML IV SOLN
INTRAVENOUS | Status: DC
  Administered 2019-09-09: 2.5 mg via INTRAVENOUS
  Administered 2019-09-09: 11.5 mg via INTRAVENOUS
  Administered 2019-09-09: 30 mg via INTRAVENOUS
  Administered 2019-09-09 (×2): 5.5 mg via INTRAVENOUS
  Administered 2019-09-10: 30 mg via INTRAVENOUS
  Administered 2019-09-10: 4 mg via INTRAVENOUS
  Filled 2019-09-09: qty 30

## 2019-09-09 NOTE — Progress Notes (Signed)
Subjective: Timothy Marks is a 29 year old male with a medical history significant for sickle cell disease, chronic pain syndrome, opiate dependence and tolerance, history of oppositional defiance disorder was admitted for sickle cell pain crisis.  Patient states that pain has not been well controlled on current medication regimen.  This is patient's fourth day on IV Dilaudid PCA along with home medications.  He says that pain intensity is 8/10 primarily to right lower extremity.  He says that he has had difficulty applying full weight to lower extremity.  He endorses increased pain when walking.  Today, patient's hemoglobin is 6.7, which is decreased from his baseline.  He is s/p 1 unit PRBCs.  Patient's baseline appears to be 7-8 g/dL.  He denies dizziness, chest pain, urinary symptoms, nausea, vomiting, or diarrhea.  Objective:  Vital signs in last 24 hours:  Vitals:   09/08/19 2100 09/08/19 2323 09/09/19 0455 09/09/19 0505  BP: 102/69   111/77  Pulse: 97   88  Resp: 16 15 15 16   Temp: 98.2 F (36.8 C)   98.3 F (36.8 C)  TempSrc: Oral   Oral  SpO2: 96% 98% 95% 99%  Weight:      Height:        Intake/Output from previous day:   Intake/Output Summary (Last 24 hours) at 09/09/2019 0738 Last data filed at 09/09/2019 0500 Gross per 24 hour  Intake 3779.75 ml  Output 850 ml  Net 2929.75 ml    Physical Exam: General: Alert, awake, oriented x3, in no acute distress.  HEENT: [place]/AT PEERL, EOMI Neck: Trachea midline,  no masses, no thyromegal,y no JVD, no carotid bruit OROPHARYNX:  Moist, No exudate/ erythema/lesions.  Heart: Regular rate and rhythm, without murmurs, rubs, gallops, PMI non-displaced, no heaves or thrills on palpation.  Lungs: Clear to auscultation, no wheezing or rhonchi noted. No increased vocal fremitus resonant to percussion  Abdomen: Soft, nontender, nondistended, positive bowel sounds, no masses no hepatosplenomegaly noted..  Neuro: No focal neurological  deficits noted cranial nerves II through XII grossly intact. DTRs 2+ bilaterally upper and lower extremities. Strength 5 out of 5 in bilateral upper and lower extremities. Musculoskeletal: No warm swelling or erythema around joints, no spinal tenderness noted. Psychiatric: Patient alert and oriented x3, good insight and cognition, good recent to remote recall. Lymph node survey: No cervical axillary or inguinal lymphadenopathy noted.  Lab Results:  Basic Metabolic Panel:    Component Value Date/Time   NA 139 09/07/2019 0936   NA 139 08/31/2018 0854   K 3.5 09/07/2019 0936   CL 102 09/07/2019 0936   CO2 28 09/07/2019 0936   BUN 8 09/07/2019 0936   BUN 6 08/31/2018 0854   CREATININE 0.56 (L) 09/07/2019 0936   GLUCOSE 86 09/07/2019 0936   CALCIUM 9.0 09/07/2019 0936   CBC:    Component Value Date/Time   WBC 17.6 (H) 09/07/2019 0936   HGB 6.0 (LL) 09/07/2019 0936   HGB 8.0 (L) 11/13/2018 1408   HCT 18.1 (L) 09/07/2019 0936   HCT 25.1 (L) 11/13/2018 1408   PLT 744 (H) 09/07/2019 0936   PLT 792 (H) 11/13/2018 1408   MCV 85.0 09/07/2019 0936   MCV 95 11/13/2018 1408   NEUTROABS 13.8 (H) 09/07/2019 0936   NEUTROABS 5.5 11/13/2018 1408   LYMPHSABS 2.5 09/07/2019 0936   LYMPHSABS 2.9 11/13/2018 1408   MONOABS 0.9 09/07/2019 0936   EOSABS 0.3 09/07/2019 0936   EOSABS 0.0 11/13/2018 1408   BASOSABS 0.1 09/07/2019  9024   BASOSABS 0.0 11/13/2018 1408    Recent Results (from the past 240 hour(s))  SARS Coronavirus 2 by RT PCR (hospital order, performed in [facility] hospital lab) Nasopharyngeal Nasopharyngeal Swab     Status: None   Collection Time: 08/30/19  7:41 PM   Specimen: Nasopharyngeal Swab  Result Value Ref Range Status   SARS Coronavirus 2 NEGATIVE NEGATIVE Final    Comment: (NOTE) SARS-CoV-2 target nucleic acids are NOT DETECTED.  The SARS-CoV-2 RNA is generally detectable in upper and lower respiratory specimens during the acute phase of infection. The  lowest concentration of SARS-CoV-2 viral copies this assay can detect is 250 copies / mL. A negative result does not preclude SARS-CoV-2 infection and should not be used as the sole basis for treatment or other patient management decisions.  A negative result may occur with improper specimen collection / handling, submission of specimen other than nasopharyngeal swab, presence of viral mutation(s) within the areas targeted by this assay, and inadequate number of viral copies (<250 copies / mL). A negative result must be combined with clinical observations, patient history, and epidemiological information.  Fact Sheet for Patients:   StrictlyIdeas.no  Fact Sheet for Healthcare Providers: BankingDealers.co.za  This test is not yet approved or  cleared by the [place] FDA and has been authorized for detection and/or diagnosis of SARS-CoV-2 by FDA under an Emergency Use Authorization (EUA).  This EUA will remain in effect (meaning this test can be used) for the duration of the COVID-19 declaration under Section 564(b)(1) of the Act, 21 U.S.C. section 360bbb-3(b)(1), unless the authorization is terminated or revoked sooner.  Performed at [facility], [place] [address], [place], [place] [address]   SARS Coronavirus 2 by RT PCR (hospital order, performed in [facility] hospital lab) Nasopharyngeal Nasopharyngeal Swab     Status: None   Collection Time: 09/04/19 11:33 PM   Specimen: Nasopharyngeal Swab  Result Value Ref Range Status   SARS Coronavirus 2 NEGATIVE NEGATIVE Final    Comment: (NOTE) SARS-CoV-2 target nucleic acids are NOT DETECTED.  The SARS-CoV-2 RNA is generally detectable in upper and lower respiratory specimens during the acute phase of infection. The lowest concentration of SARS-CoV-2 viral copies this assay can detect is 250 copies / mL. A negative result does not preclude SARS-CoV-2 infection and should not be  used as the sole basis for treatment or other patient management decisions.  A negative result may occur with improper specimen collection / handling, submission of specimen other than nasopharyngeal swab, presence of viral mutation(s) within the areas targeted by this assay, and inadequate number of viral copies (<250 copies / mL). A negative result must be combined with clinical observations, patient history, and epidemiological information.  Fact Sheet for Patients:   StrictlyIdeas.no  Fact Sheet for Healthcare Providers: BankingDealers.co.za  This test is not yet approved or  cleared by the [place] FDA and has been authorized for detection and/or diagnosis of SARS-CoV-2 by FDA under an Emergency Use Authorization (EUA).  This EUA will remain in effect (meaning this test can be used) for the duration of the COVID-19 declaration under Section 564(b)(1) of the Act, 21 U.S.C. section 360bbb-3(b)(1), unless the authorization is terminated or revoked sooner.  Performed at [facility], [place] [address], [place], [place] [address]     Studies/Results: No results found.  Medications: Scheduled Meds: . enoxaparin (LOVENOX) injection  40 mg Subcutaneous Q24H  . folic acid  1 mg Oral  Daily  . gabapentin  300 mg Oral TID  . HYDROmorphone   Intravenous Q4H  . ketorolac  15 mg Intravenous Q6H  . oxyCODONE  20 mg Oral Q12H  . senna-docusate  1 tablet Oral BID   Continuous Infusions: . sodium chloride    . dextrose 5 % and 0.45% NaCl 20 mL/hr at 09/09/19 0500  . diphenhydrAMINE     PRN Meds:.sodium chloride, acetaminophen, diphenhydrAMINE **OR** diphenhydrAMINE, naloxone **AND** sodium chloride flush, ondansetron, oxyCODONE-acetaminophen **AND** oxyCODONE, polyethylene glycol, polyvinyl alcohol  Consultants:  None  Procedures:  None  Antibiotics:  None  Assessment/Plan: Active Problems:   Sickle cell  pain crisis (HCC)   Adjustment disorder with mixed disturbance of emotions and conduct   Chronic pain syndrome   Acute bilateral low back pain without sciatica  Sickle cell disease with pain crisis: Continue [facility] at [facility] Decreasing weight-based Dilaudid PCA, settings changed to 0.5 mg, 10-minute lockout, and 2 mg/h. Continue IV Toradol 15 mg every 6 hours for total of 5 days Reevaluate pain scale regularly, monitor vital signs closely, and supplemental oxygen as needed  Sickle cell anemia: Today, patient's hemoglobin is 6.7.  Transfuse 1 unit of PRBCs.  Follow CBC in a.m.  Leukocytosis: Stable.  Trending down.  More than likely reactive.  Patient had no evidence of infection or inflammation.  Continue to monitor closely without antibiotics.  CBC in a.m.  Chronic pain syndrome: Continue home pain medications  History of oppositional defiance disorder: Stable.  Patient has been, continue to monitor closely.    Code Status: Full Code Family Communication: N/A Disposition Plan: Not yet ready for discharge   Morrison, MSN, FNP-C Patient [place] [address] [place], [place] [address] [phone]  If 5PM-8AM, please contact night-coverage.  09/09/2019, 7:38 AM  LOS: 4 days

## 2019-09-09 NOTE — Plan of Care (Signed)
  Problem: Education: Goal: Long-term complications will improve Outcome: Progressing   Problem: Self-Care: Goal: Ability to incorporate actions that prevent/reduce pain crisis will improve Outcome: Progressing   Problem: Bowel/Gastric: Goal: Gut motility will be maintained Outcome: Progressing   Problem: Tissue Perfusion: Goal: Complications related to inadequate tissue perfusion will be avoided or minimized Outcome: Progressing   Problem: Respiratory: Goal: Pulmonary complications will be avoided or minimized Outcome: Progressing Goal: Acute Chest Syndrome will be identified early to prevent complications Outcome: Progressing   Problem: Fluid Volume: Goal: Ability to maintain a balanced intake and output will improve Outcome: Progressing   Problem: Education: Goal: Knowledge of General Education information will improve Description: Including pain rating scale, medication(s)/side effects and non-pharmacologic comfort measures Outcome: Progressing   Problem: Health Behavior/Discharge Planning: Goal: Ability to manage health-related needs will improve Outcome: Progressing   Problem: Clinical Measurements: Goal: Ability to maintain clinical measurements within normal limits will improve Outcome: Progressing Goal: Will remain free from infection Outcome: Progressing Goal: Diagnostic test results will improve Outcome: Progressing Goal: Respiratory complications will improve Outcome: Progressing Goal: Cardiovascular complication will be avoided Outcome: Progressing   Problem: Activity: Goal: Risk for activity intolerance will decrease Outcome: Progressing   Problem: Nutrition: Goal: Adequate nutrition will be maintained Outcome: Progressing   Problem: Elimination: Goal: Will not experience complications related to bowel motility Outcome: Progressing Goal: Will not experience complications related to urinary retention Outcome: Progressing   Problem:  Safety: Goal: Ability to remain free from injury will improve Outcome: Progressing   Problem: Skin Integrity: Goal: Risk for impaired skin integrity will decrease Outcome: Progressing

## 2019-09-09 NOTE — Progress Notes (Signed)
Timothy Marks scores Yellow2 MEWS due to HR of 131. This patient resting quietly in bed with eye closed. No acute distress noted. Aroused to name. Denies CP, SOB, N/V when questioned; however, Timothy Marks did complain of generalized discomfort due to "my sickle cell".. Encouraged this patient to use his PCA with a returned voice of understanding.  Hospitalist, [place] notified via page of the above.

## 2019-09-10 LAB — TYPE AND SCREEN
ABO/RH(D): A POS
Antibody Screen: NEGATIVE
Donor AG Type: NEGATIVE
Donor AG Type: NEGATIVE
Unit division: 0
Unit division: 0

## 2019-09-10 LAB — BPAM RBC
Blood Product Expiration Date: 202108092359
Blood Product Expiration Date: 202108162359
ISSUE DATE / TIME: 202107250116
ISSUE DATE / TIME: 202107261342
Unit Type and Rh: 600
Unit Type and Rh: 600

## 2019-09-10 NOTE — Discharge Summary (Signed)
Physician Discharge Summary  Timothy Marks EXB:[id number] DOB: Jul 04, 1990 DOA: 09/04/2019  PCP: Vevelyn Francois, NP  Admit date: 09/04/2019  Discharge date: 09/10/2019  Discharge Diagnoses:  Principal Problem:   Sickle cell pain crisis ([place]) Active Problems:   Adjustment disorder with mixed disturbance of emotions and conduct   Chronic pain syndrome   Acute bilateral low back pain without sciatica   Discharge Condition: Stable  Disposition:   Follow-up Information    Vevelyn Francois, NP Follow up in 1 week(s).   Specialty: Adult Health Nurse Practitioner Contact information: [address] [place] [address] [phone]              Pt is discharged home in good condition and is to follow up with Vevelyn Francois, NP this week to have labs evaluated. Timothy Marks is instructed to increase activity slowly and balance with rest for the next few days, and use prescribed medication to complete treatment of pain  Diet: Regular Wt Readings from Last 3 Encounters:  09/10/19 56.6 kg  08/30/19 54.4 kg  08/26/19 54.4 kg    History of present illness:  Timothy Marks is a 29 year old male with a medical history significant for sickle cell disease, chronic pain syndrome, opiate dependence and tolerance, history of anemia of chronic disease, major depression, and adjustment disorder with mixed emotional and conduct disturbance who was admitted via the emergency room today because of generalized body pain consistent with his typical sickle cell pain crisis.  He presented to the emergency room last night with generalized body pain but mostly in the lower back and left lower limb, no report of fall or injury, no joint swelling, or redness.  He reported no significant aggravating factor, slightly relieved with his home medications but the relief was not sustained.  He was previously seen at the day hospital for extended pain management on 09/02/2019.  His current pain intensity is  about 8/10, characterized as constant and throbbing.  He denies any fever, cough, chest pain, shortness of breath, urinary symptoms, nausea, vomiting, or diarrhea.  He denies any sick contacts, recent travel, or exposure to COVID-19.  Patient has been screened and he tested negative for COVID-19 today.  ER course: Patient was found to have vital signs of BP 114/66, pulse 78, temperature 99.1 F, respirations 23.  His hemoglobin was 7.8, which is consistent with his baseline.  WBC count slightly elevated at 14.2.  Comprehensive metabolic panel was largely within normal limits or at its baseline.  His most recent chest x-ray was on 08/30/2019, which showed no acute cardiopulmonary process.  He received multiple doses of IV Dilaudid in the emergency room with no relief of pain.  Patient is being admitted for further evaluation and for sickle cell pain management.  Hospital Course:  Sickle cell disease with pain crisis: Patient was admitted for sickle cell pain crisis and managed appropriately with IVF, IV Dilaudid via PCA and IV Toradol, as well as other adjunct therapies per sickle cell pain management protocols.  IV Dilaudid PCA was weaned appropriately.  OxyContin was continued without interruption.  Patient will resume home opiate medication regimen.  He states that he is out of home medication.  According to PDMP, medications are not due for refill at this time.  Patient advised to contact PCP for medication management.  He expressed understanding.  His current pain intensity is 5/10.  Patient says that he can manage at home and has scheduled follow-up with PCP on 09/11/2019.  Sickle cell anemia: Patient's baseline hemoglobin is 7-8 g/dL.  During admission, hemoglobin decreased to 6.8.  Patient was transfused 1 unit PRBCs.  Hemoglobin returned to baseline at 7.9 prior to discharge. Patient is alert, oriented, and ambulating without assistance.  He is afebrile without any signs of infection or  inflammation.   Patient was discharged home today in a hemodynamically stable condition.   Discharge Exam: Vitals:   09/10/19 0259 09/10/19 0627  BP:  113/74  Pulse:  74  Resp: 13 14  Temp:  98.1 F (36.7 C)  SpO2: 96% (!) 81%   Vitals:   09/09/19 2324 09/10/19 0202 09/10/19 0259 09/10/19 0627  BP:  115/85  113/74  Pulse:  82  74  Resp: 13 16 13 14   Temp:  98 F (36.7 C)  98.1 F (36.7 C)  TempSrc:  Oral  Oral  SpO2: 98% 99% 96% (!) 81%  Weight:    56.6 kg  Height:        General appearance : Awake, alert, not in any distress. Speech Clear. Not toxic looking HEENT: Atraumatic and Normocephalic, pupils equally reactive to light and accomodation Neck: Supple, no JVD. No cervical lymphadenopathy.  Chest: Good air entry bilaterally, no added sounds  CVS: S1 S2 regular, no murmurs.  Abdomen: Bowel sounds present, Non tender and not distended with no gaurding, rigidity or rebound. Extremities: B/L Lower Ext shows no edema, both legs are warm to touch Neurology: Awake alert, and oriented X 3, CN II-XII intact, Non focal Skin: No Rash  Discharge Instructions  Discharge Instructions    Discharge patient   Complete by: As directed    Discharge disposition: 01-Home or Self Care   Discharge patient date: 09/10/2019     Allergies as of 09/10/2019      Reactions   Buprenorphine Hcl Hives   Levaquin [levofloxacin] Itching   Meperidine Rash   Morphine Hives   Morphine And Related Hives   Toradol [ketorolac Tromethamine] Itching   Tramadol Hives   Vancomycin Itching   Zosyn [piperacillin Sod-tazobactam So] Itching, Rash, Other (See Comments)   Has taken rocephin in the past      Medication List    TAKE these medications   acetaminophen 500 MG tablet Commonly known as: TYLENOL Take 500-1,000 mg by mouth every 6 (six) hours as needed for mild pain or headache.   cholecalciferol 25 MCG (1000 UNIT) tablet Commonly known as: VITAMIN D Take 2 tablets (2,000 Units total)  by mouth daily.   Ensure Active High Protein Liqd Take 1 each by mouth 3 (three) times daily between meals. #90 cans of Ensure   folic acid 1 MG tablet Commonly known as: FOLVITE Take 1 tablet (1 mg total) by mouth daily.   gabapentin 300 MG capsule Commonly known as: NEURONTIN Take 1 capsule (300 mg total) by mouth 3 (three) times daily. What changed: when to take this   hydroxyurea 500 MG capsule Commonly known as: HYDREA Take 1 capsule (500 mg total) by mouth 2 (two) times daily. May take with food to minimize GI side effects.   ibuprofen 800 MG tablet Commonly known as: ADVIL TAKE 1 TABLET(800 MG) BY MOUTH THREE TIMES DAILY AS NEEDED FOR PAIN What changed: See the new instructions.   ibuprofen 800 MG tablet Commonly known as: ADVIL TAKE 1 TABLET(800 MG) BY MOUTH THREE TIMES DAILY AS NEEDED FOR PAIN What changed: Another medication with the same name was changed. Make sure you understand how and when to  take each.   oxyCODONE-acetaminophen 10-325 MG tablet Commonly known as: Percocet Take 1 tablet by mouth every 6 (six) hours as needed for up to 15 days for pain. What changed: when to take this   OxyCONTIN 20 mg 12 hr tablet Generic drug: oxyCODONE Take 1 tablet (20 mg total) by mouth every 12 (twelve) hours. Start taking on: September 14, 2019       The results of significant diagnostics from this hospitalization (including imaging, microbiology, ancillary and laboratory) are listed below for reference.    Significant Diagnostic Studies: DG Chest 2 View  Result Date: 08/30/2019 CLINICAL DATA:  Chest pain in a patient with a history of sickle cell disease. EXAM: CHEST - 2 VIEW COMPARISON:  PA and lateral chest 05/30/2019. FINDINGS: Lungs clear. Heart size normal. No pneumothorax or pleural effusion. No acute bony abnormality. Bony changes of sickle cell disease noted. IMPRESSION: No acute disease. Electronically Signed   By: Inge Rise M.D.   On: 08/30/2019 09:41    DG Chest Port 1 View  Result Date: 08/30/2019 CLINICAL DATA:  Chest pain EXAM: PORTABLE CHEST 1 VIEW COMPARISON:  08/30/2019 FINDINGS: The heart size and mediastinal contours are within normal limits. Both lungs are clear. The visualized skeletal structures are unremarkable. IMPRESSION: No active disease. Electronically Signed   By: Fidela Salisbury MD   On: 08/30/2019 19:57    Microbiology: Recent Results (from the past 240 hour(s))  SARS Coronavirus 2 by RT PCR (hospital order, performed in [facility] hospital lab) Nasopharyngeal Nasopharyngeal Swab     Status: None   Collection Time: 09/04/19 11:33 PM   Specimen: Nasopharyngeal Swab  Result Value Ref Range Status   SARS Coronavirus 2 NEGATIVE NEGATIVE Final    Comment: (NOTE) SARS-CoV-2 target nucleic acids are NOT DETECTED.  The SARS-CoV-2 RNA is generally detectable in upper and lower respiratory specimens during the acute phase of infection. The lowest concentration of SARS-CoV-2 viral copies this assay can detect is 250 copies / mL. A negative result does not preclude SARS-CoV-2 infection and should not be used as the sole basis for treatment or other patient management decisions.  A negative result may occur with improper specimen collection / handling, submission of specimen other than nasopharyngeal swab, presence of viral mutation(s) within the areas targeted by this assay, and inadequate number of viral copies (<250 copies / mL). A negative result must be combined with clinical observations, patient history, and epidemiological information.  Fact Sheet for Patients:   StrictlyIdeas.no  Fact Sheet for Healthcare Providers: BankingDealers.co.za  This test is not yet approved or  cleared by the [place] FDA and has been authorized for detection and/or diagnosis of SARS-CoV-2 by FDA under an Emergency Use Authorization (EUA).  This EUA will remain in effect (meaning  this test can be used) for the duration of the COVID-19 declaration under Section 564(b)(1) of the Act, 21 U.S.C. section 360bbb-3(b)(1), unless the authorization is terminated or revoked sooner.  Performed at [facility], [place] [address], [place], [place] [address]      Labs: Basic Metabolic Panel: Recent Labs  Lab 09/04/19 2300 09/07/19 0936  NA 138 139  K 3.9 3.5  CL 102 102  CO2 24 28  GLUCOSE 95 86  BUN 9 8  CREATININE 0.58* 0.56*  CALCIUM 9.5 9.0   Liver Function Tests: Recent Labs  Lab 09/04/19 2300 09/07/19 0936  AST 18 24  ALT 11 11  ALKPHOS 68 73  BILITOT 1.8* 4.2*  PROT 8.4* 8.1  ALBUMIN 4.2 4.0   No results for input(s): LIPASE, AMYLASE in the last 168 hours. No results for input(s): AMMONIA in the last 168 hours. CBC: Recent Labs  Lab 09/04/19 2300 09/07/19 0936 09/09/19 0714 09/09/19 1834  WBC 14.2* 17.6* 13.4*  --   NEUTROABS 10.1* 13.8*  --   --   HGB 7.8* 6.0* 6.7* 7.9*  HCT 23.3* 18.1* 19.4* 23.8*  MCV 84.7 85.0 82.9  --   PLT 560* 744* 762*  --    Cardiac Enzymes: No results for input(s): CKTOTAL, CKMB, CKMBINDEX, TROPONINI in the last 168 hours. BNP: Invalid input(s): POCBNP CBG: No results for input(s): GLUCAP in the last 168 hours.  Time coordinating discharge: 35 minutes  Signed:  [name]  APRN, MSN, FNP-C [facility] [place] [address] [place], [place] [address] [phone]  [facility] 09/10/2019, 8:40 AM

## 2019-09-10 NOTE — Progress Notes (Signed)
Pt discharged home with all belongings. Discharge education completed, no questions or concerns from pt at time of discharge.

## 2019-09-11 ENCOUNTER — Other Ambulatory Visit: Payer: Self-pay | Admitting: Nurse Practitioner

## 2019-09-11 ENCOUNTER — Telehealth: Payer: Self-pay | Admitting: Nurse Practitioner

## 2019-09-11 DIAGNOSIS — G894 Chronic pain syndrome: Secondary | ICD-10-CM

## 2019-09-11 DIAGNOSIS — D571 Sickle-cell disease without crisis: Secondary | ICD-10-CM

## 2019-09-11 DIAGNOSIS — F1290 Cannabis use, unspecified, uncomplicated: Secondary | ICD-10-CM

## 2019-09-11 MED ORDER — OXYCODONE-ACETAMINOPHEN 10-325 MG PO TABS: 1.0000 | ORAL_TABLET | Freq: Four times a day (QID) | ORAL | 0 refills | Status: DC | PRN

## 2019-09-11 NOTE — Progress Notes (Signed)
   [place], [place]  [address] Phone:  [phone]   Fax:  [phone]  PDMP not reviewed this encounter.Subjective:    Timothy Marks calls for refill of his oxycodone/acetaminophen 10/325 mg Objective:  He has a history of Hb-SS disease.  He is currently on hydroxyurea 500 mg twice daily, Advil 800 mg 3 times daily as needed oxycodone/acetaminophen 10/325 mg every 6 hours and OxyContin 20 mg twice daily.  He has had 4 emergency room in the last 2 months.  His last urine drug screen was July 2021.  It was positive for cannabinoids. PDMP not reviewed this encounter.   Assessment:    analgesic for chronic pain due to sickle cell disease Plan:  Refill on the oxycodone/acetaminophen 10/325 mg Discussed with patient the use of THC and opioids at next office visit  Return for follow-up appointment as scheduled.

## 2019-09-11 NOTE — Telephone Encounter (Signed)
Needs refill for oxycodone & oxycontin

## 2019-09-17 NOTE — Telephone Encounter (Signed)
Med refilled.

## 2019-09-20 ENCOUNTER — Emergency Department (HOSPITAL_BASED_OUTPATIENT_CLINIC_OR_DEPARTMENT_OTHER): Payer: Medicaid Other

## 2019-09-20 ENCOUNTER — Encounter (HOSPITAL_COMMUNITY): Payer: Self-pay | Admitting: Emergency Medicine

## 2019-09-20 ENCOUNTER — Other Ambulatory Visit: Payer: Self-pay

## 2019-09-20 ENCOUNTER — Emergency Department (HOSPITAL_COMMUNITY)
Admission: EM | Admit: 2019-09-20 | Discharge: 2019-09-20 | Disposition: A | Discharge status: Home / Self Care | Payer: Medicaid Other | Attending: Emergency Medicine | Admitting: Emergency Medicine

## 2019-09-20 DIAGNOSIS — R600 Localized edema: Secondary | ICD-10-CM | POA: Diagnosis not present

## 2019-09-20 DIAGNOSIS — M7918 Myalgia, other site: Secondary | ICD-10-CM | POA: Insufficient documentation

## 2019-09-20 DIAGNOSIS — D5700 Hb-SS disease with crisis, unspecified: Secondary | ICD-10-CM | POA: Insufficient documentation

## 2019-09-20 DIAGNOSIS — I82432 Acute embolism and thrombosis of left popliteal vein: Secondary | ICD-10-CM | POA: Insufficient documentation

## 2019-09-20 DIAGNOSIS — Z79899 Other long term (current) drug therapy: Secondary | ICD-10-CM | POA: Insufficient documentation

## 2019-09-20 DIAGNOSIS — M79662 Pain in left lower leg: Secondary | ICD-10-CM | POA: Diagnosis present

## 2019-09-20 DIAGNOSIS — M79609 Pain in unspecified limb: Secondary | ICD-10-CM

## 2019-09-20 MED ORDER — OXYCODONE-ACETAMINOPHEN 10-325 MG PO TABS: 1.0000 | ORAL_TABLET | Freq: Once | ORAL | Status: DC

## 2019-09-20 MED ORDER — RIVAROXABAN 15 MG PO TABS
15.0000 mg | ORAL_TABLET | Freq: Once | ORAL | Status: AC
  Administered 2019-09-20: 15 mg via ORAL
  Filled 2019-09-20: qty 1

## 2019-09-20 MED ORDER — OXYCODONE-ACETAMINOPHEN 5-325 MG PO TABS
1.0000 | ORAL_TABLET | Freq: Once | ORAL | Status: AC
  Administered 2019-09-20: 1 via ORAL
  Filled 2019-09-20: qty 1

## 2019-09-20 MED ORDER — RIVAROXABAN (XARELTO) VTE STARTER PACK (15 & 20 MG)
ORAL_TABLET | ORAL | 0 refills | Status: DC
Start: 2019-09-20 — End: 2019-10-28

## 2019-09-20 MED ORDER — OXYCODONE HCL 5 MG PO TABS
5.0000 mg | ORAL_TABLET | Freq: Once | ORAL | Status: AC
  Administered 2019-09-20: 5 mg via ORAL
  Filled 2019-09-20: qty 1

## 2019-09-20 NOTE — ED Triage Notes (Signed)
Pt reports pain and swelling to Left calf. Denies dvt hx or recent travel, hx of sickle cell.

## 2019-09-20 NOTE — ED Notes (Signed)
Patient transported to vascular lab.

## 2019-09-20 NOTE — ED Provider Notes (Signed)
[facility] EMERGENCY DEPARTMENT Provider Note   CSN: [id number] Arrival date & time: 09/20/19  0825     History Chief Complaint  Patient presents with  . Leg Pain    Timothy Marks is a 29 y.o. male.  HPI 29 year old male presents with left calf pain.  Started 2 or 3 days ago. No trauma or recent travel. Has pain in his posterior knee. No weakness or numbness. No chest pain/dyspnea. Has some chronic low back pain. No prior history of DVT.  No thigh pain/swelling.   Past Medical History:  Diagnosis Date  . Depression   . Generalized weakness 03/31/2019  . 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 Sickle cell anemia ([facility])   . Vitamin D deficiency y-6    Patient Active Problem List   Diagnosis Date Noted  . Underweight 06/14/2019  . Generalized weakness 03/31/2019  . Sickle-cell crisis ([place]) 03/24/2019  . SIRS (systemic inflammatory response syndrome) ([place]) 02/10/2019  . Abnormal liver function 11/04/2018  . Sickle cell anemia with crisis ([place]) 05/22/2018  . Acute bilateral low back pain without sciatica   . Other chronic pain   . Anemia   . 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 Physical deconditioning   . Agitation   . Chronic pain syndrome   . Recurrent cold sores   . Fever   . Pain   . Sickle cell crisis ([place]) 07/16/2017  . Adjustment disorder with mixed disturbance of emotions and conduct   . Constipation 08/03/2014  . Leukocytosis 06/30/2014  . 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 Protein-calorie malnutrition, severe ([place]) 04/09/2014  . Major depressive disorder, recurrent, severe without psychotic features ([place])   . 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 Vitamin D deficiency   . Sickle cell anemia ([place]) 03/30/2014  . Hyperbilirubinemia 03/30/2014  . Hypokalemia 02/07/2014  . Aggressive behavior 01/25/2013  . Sickle cell anemia with pain ([place]) 01/24/2013  . Sickle cell  pain crisis ([place]) 01/24/2013    Past Surgical History:  Procedure Laterality Date  . CHOLECYSTECTOMY         Family History  Problem Relation Age of Onset  . Sickle cell trait Mother   . Sickle cell trait Father     Social History   Tobacco Use  . Smoking status: Never Smoker  . Smokeless tobacco: Never Used  Vaping Use  . Vaping Use: Never used  Substance Use Topics  . Alcohol use: No  . Drug use: Not Currently    Types: Marijuana    Home Medications Prior to Admission medications   Medication Sig Start Date End Date Taking? Authorizing Provider  acetaminophen (TYLENOL) 500 MG tablet Take 500-1,000 mg by mouth every 6 (six) hours as needed for mild pain or headache.    [provider]  cholecalciferol (VITAMIN D) 1000 units tablet Take 2 tablets (2,000 Units total) by mouth daily. Patient not taking: Reported on 08/30/2019 12/13/17   Azzie Glatter, FNP  folic acid (FOLVITE) 1 MG tablet Take 1 tablet (1 mg total) by mouth daily. Patient not taking: Reported on 08/30/2019 05/01/17   Dorena Dew, FNP  gabapentin (NEURONTIN) 300 MG capsule Take 1 capsule (300 mg total) by mouth 3 (three) times daily. Patient taking differently: Take 300 mg by mouth 2 (two) times daily.  03/12/19   Dorena Dew, FNP  hydroxyurea (HYDREA) 500 MG capsule Take 1 capsule (500 mg total)  by mouth 2 (two) times daily. May take with food to minimize GI side effects. Patient not taking: Reported on 08/30/2019 05/01/17   Dorena Dew, FNP  Nutritional Supplements (ENSURE ACTIVE HIGH PROTEIN) LIQD Take 1 each by mouth 3 (three) times daily between meals. #90 cans of Ensure Patient not taking: Reported on 08/30/2019 11/13/18   Dorena Dew, FNP  oxyCODONE-acetaminophen (PERCOCET) 10-325 MG tablet Take 1 tablet by mouth every 6 (six) hours as needed for up to 15 days for pain. 09/13/19 09/28/19  Vevelyn Francois, NP  OXYCONTIN 20 MG 12 hr tablet Take 1 tablet (20 mg total) by mouth  every 12 (twelve) hours. 09/14/19 10/14/19  Vevelyn Francois, NP  RIVAROXABAN [employer] Reasons) VTE STARTER PACK (15 & 20 MG TABLETS) Follow package directions: Take one 15mg  tablet by mouth twice a day. On day 22, switch to one 20mg  tablet once a day. Take with food. 09/20/19   Sherwood Gambler, MD    Allergies    Buprenorphine hcl, Levaquin [levofloxacin], Meperidine, Morphine, Morphine and related, Toradol [ketorolac tromethamine], Tramadol, Vancomycin, and Zosyn [piperacillin sod-tazobactam so]  Review of Systems   Review of Systems  Respiratory: Negative for shortness of breath.   Cardiovascular: Positive for leg swelling. Negative for chest pain.  Musculoskeletal: Positive for myalgias.  All other systems reviewed and are negative.   Physical Exam Updated Vital Signs BP 119/82 (BP Location: Left Arm)   Pulse 72   Temp 99.1 F (37.3 C) (Oral)   Resp 18   Ht 6\' 3"  (1.905 m)   Wt 59 kg   SpO2 100%   BMI 16.25 kg/m   Physical Exam Vitals and nursing note reviewed.  Constitutional:      General: He is not in acute distress.    Appearance: He is well-developed. He is not ill-appearing or diaphoretic.  HENT:     Head: Normocephalic and atraumatic.     Right Ear: External ear normal.     Left Ear: External ear normal.     Nose: Nose normal.  Eyes:     General:        Right eye: No discharge.        Left eye: No discharge.  Cardiovascular:     Rate and Rhythm: Normal rate and regular rhythm.     Pulses:          Dorsalis pedis pulses are 2+ on the left side.  Pulmonary:     Effort: Pulmonary effort is normal.  Abdominal:     Palpations: Abdomen is soft.     Tenderness: There is no abdominal tenderness.  Musculoskeletal:     Cervical back: Neck supple.     Left upper leg: No swelling or tenderness.     Left knee: No swelling or effusion. Normal range of motion. Tenderness (posterior) present.     Left lower leg: Swelling and tenderness present.  Skin:    General: Skin is warm  and dry.  Neurological:     Mental Status: He is alert.  Psychiatric:        Mood and Affect: Mood is not anxious.     ED Results / Procedures / Treatments   Labs (all labs ordered are listed, but only abnormal results are displayed) Labs Reviewed - No data to display  EKG None  Radiology VAS [place] LOWER EXTREMITY VENOUS (DVT) (ONLY [facility] & [facility])  Result Date: 09/20/2019  Lower Venous DVTStudy Indications: Pain in left leg.  Comparison Study: No  prior studies. Performing Technologist: Darlin Coco  Examination Guidelines: A complete evaluation includes B-mode imaging, spectral Doppler, color Doppler, and power Doppler as needed of all accessible portions of each vessel. Bilateral testing is considered an integral part of a complete examination. Limited examinations for reoccurring indications may be performed as noted. The reflux portion of the exam is performed with the patient in reverse Trendelenburg.  +-----+---------------+---------+-----------+----------+--------------+ RIGHTCompressibilityPhasicitySpontaneityPropertiesThrombus Aging +-----+---------------+---------+-----------+----------+--------------+ CFV  Full           Yes      Yes                                 +-----+---------------+---------+-----------+----------+--------------+   +---------+---------------+---------+-----------+----------+--------------+ LEFT     CompressibilityPhasicitySpontaneityPropertiesThrombus Aging +---------+---------------+---------+-----------+----------+--------------+ CFV      Full           Yes      Yes                                 +---------+---------------+---------+-----------+----------+--------------+ SFJ      Full                                                        +---------+---------------+---------+-----------+----------+--------------+ FV Prox  Full                                                         +---------+---------------+---------+-----------+----------+--------------+ FV Mid   Full                                                        +---------+---------------+---------+-----------+----------+--------------+ FV DistalFull                                                        +---------+---------------+---------+-----------+----------+--------------+ PFV      Full                                                        +---------+---------------+---------+-----------+----------+--------------+ POP      None           No       No                   Acute          +---------+---------------+---------+-----------+----------+--------------+ PTV      Full                                                        +---------+---------------+---------+-----------+----------+--------------+  PERO     None           No       No                   Acute          +---------+---------------+---------+-----------+----------+--------------+     Summary: RIGHT: - No evidence of common femoral vein obstruction.  LEFT: - Findings consistent with acute deep vein thrombosis involving the left popliteal vein, and left peroneal veins. - No cystic structure found in the popliteal fossa.  *See table(s) above for measurements and observations.    Preliminary     Procedures Procedures (including critical care time)  Medications Ordered in ED Medications  oxyCODONE-acetaminophen (PERCOCET/ROXICET) 5-325 MG per tablet 1 tablet (1 tablet Oral Given 09/20/19 1054)    And  oxyCODONE (Oxy IR/ROXICODONE) immediate release tablet 5 mg (5 mg Oral Given 09/20/19 1054)  Rivaroxaban (XARELTO) tablet 15 mg (15 mg Oral Given 09/20/19 1202)    ED Course  I have reviewed the triage vital signs and the nursing notes.  Pertinent labs & imaging results that were available during my care of the patient were reviewed by me and considered in my medical decision making (see chart for details).    MDM  Rules/Calculators/A&P                          DVT ultrasound confirmed popliteal DVT. He is neurovascularly intact. No signs of severe disease. No signs/symptoms of PE. Most recent labs reviewed, has normal renal function. Chronic anemia but not from blood loss. Discussed risks/benefits of anti-coagulation. Will put on xarelto. Follow up with his PCP/sickle cell. Final Clinical Impression(s) / ED Diagnoses Final diagnoses:  Acute deep vein thrombosis (DVT) of popliteal vein of left lower extremity ([place])    Rx / DC Orders ED Discharge Orders         Ordered    RIVAROXABAN (XARELTO) VTE STARTER PACK (15 & 20 MG TABLETS)     Discontinue  Reprint     09/20/19 1146           Sherwood Gambler, MD 09/20/19 1209

## 2019-09-20 NOTE — ED Notes (Signed)
Patient c/o left leg pain and lower back pain, denies injury. States pain has been for several days. The entire time he was talking to nurse was texting on his phone

## 2019-09-20 NOTE — Progress Notes (Signed)
Lower extremity venous LT study completed.   Results relayed to Regenia Skeeter, MD.  See Cv Proc for preliminary results.   Darlin Coco

## 2019-09-20 NOTE — Discharge Instructions (Signed)
Your ultrasound shows a blood clot/deep vein thrombosis in your left leg.  You are being prescribed a blood thinner, Xarelto, to treat this.  You need to follow-up with your primary care physician/sickle cell doctor.  Do not take aspirin or Ibuprofen/Advil/Aleve/Motrin/Goody Powders/Naproxen/BC powders/Meloxicam/Diclofenac/Indomethacin and other Nonsteroidal anti-inflammatory medications while you are on the Xarelto.  If you develop new or worsening pain in your leg, worsening swelling, chest pain, shortness of breath, or any other new/concerning symptoms then return to the ER for evaluation.  Information on my medicine - XARELTO (rivaroxaban)  This medication education was reviewed with me or my healthcare representative as part of my discharge preparation.   WHY WAS XARELTO PRESCRIBED FOR YOU? Xarelto was prescribed to treat blood clots that may have been found in the veins of your legs (deep vein thrombosis) or in your lungs (pulmonary embolism) and to reduce the risk of them occurring again.  What do you need to know about Xarelto? The starting dose is one 15 mg tablet taken TWICE daily with food for the FIRST 21 DAYS then the dose is changed to one 20 mg tablet taken ONCE A DAY with your evening meal.  DO NOT stop taking Xarelto without talking to the health care provider who prescribed the medication.  Refill your prescription for 20 mg tablets before you run out.  After discharge, you should have regular check-up appointments with your healthcare provider that is prescribing your Xarelto.  In the future your dose may need to be changed if your kidney function changes by a significant amount.  What do you do if you miss a dose? If you are taking Xarelto TWICE DAILY and you miss a dose, take it as soon as you remember. You may take two 15 mg tablets (total 30 mg) at the same time then resume your regularly scheduled 15 mg twice daily the next day.  If you are taking Xarelto ONCE  DAILY and you miss a dose, take it as soon as you remember on the same day then continue your regularly scheduled once daily regimen the next day. Do not take two doses of Xarelto at the same time.   Important Safety Information Xarelto is a blood thinner medicine that can cause bleeding. You should call your healthcare provider right away if you experience any of the following: ? Bleeding from an injury or your nose that does not stop. ? Unusual colored urine (red or dark brown) or unusual colored stools (red or black). ? Unusual bruising for unknown reasons. ? A serious fall or if you hit your head (even if there is no bleeding).  Some medicines may interact with Xarelto and might increase your risk of bleeding while on Xarelto. To help avoid this, consult your healthcare provider or pharmacist prior to using any new prescription or non-prescription medications, including herbals, vitamins, non-steroidal anti-inflammatory drugs (NSAIDs) and supplements.  This website has more information on Xarelto: [email or web address].[email or web address]

## 2019-09-20 NOTE — ED Notes (Signed)
Dr Regenia Skeeter made aware of patient, verbal order given to this RN to place DVT study.

## 2019-09-26 ENCOUNTER — Telehealth: Payer: Self-pay

## 2019-09-26 ENCOUNTER — Other Ambulatory Visit: Payer: Self-pay | Admitting: Family Medicine

## 2019-09-26 DIAGNOSIS — D571 Sickle-cell disease without crisis: Secondary | ICD-10-CM

## 2019-09-26 DIAGNOSIS — G894 Chronic pain syndrome: Secondary | ICD-10-CM

## 2019-09-26 MED ORDER — OXYCODONE-ACETAMINOPHEN 10-325 MG PO TABS: 1.0000 | ORAL_TABLET | Freq: Four times a day (QID) | ORAL | 0 refills | Status: DC | PRN

## 2019-09-26 NOTE — Progress Notes (Signed)
Reviewed PDMP substance reporting system prior to prescribing opiate medications. No inconsistencies noted.     Meds ordered this encounter  Medications  . oxyCODONE-acetaminophen (PERCOCET) 10-325 MG tablet    Sig: Take 1 tablet by mouth every 6 (six) hours as needed for up to 15 days for pain.    Dispense:  60 tablet    Refill:  0    May fill on 09/13/19    Order Specific Question:   Supervising Provider    Answer:   Tresa Garter [[id number]]     [name]  APRN, MSN, FNP-C [facility] [address] [place], [place] [address] [phone]

## 2019-10-07 ENCOUNTER — Encounter: Payer: Self-pay | Admitting: Nurse Practitioner

## 2019-10-07 ENCOUNTER — Other Ambulatory Visit: Payer: Self-pay

## 2019-10-07 ENCOUNTER — Other Ambulatory Visit: Payer: Self-pay | Admitting: Nurse Practitioner

## 2019-10-07 DIAGNOSIS — G894 Chronic pain syndrome: Secondary | ICD-10-CM

## 2019-10-07 DIAGNOSIS — D571 Sickle-cell disease without crisis: Secondary | ICD-10-CM

## 2019-10-07 DIAGNOSIS — Z79891 Long term (current) use of opiate analgesic: Secondary | ICD-10-CM

## 2019-10-09 ENCOUNTER — Other Ambulatory Visit: Payer: Self-pay | Admitting: Nurse Practitioner

## 2019-10-09 ENCOUNTER — Telehealth: Payer: Self-pay | Admitting: Nurse Practitioner

## 2019-10-09 DIAGNOSIS — G894 Chronic pain syndrome: Secondary | ICD-10-CM

## 2019-10-09 DIAGNOSIS — Z79891 Long term (current) use of opiate analgesic: Secondary | ICD-10-CM

## 2019-10-09 DIAGNOSIS — D571 Sickle-cell disease without crisis: Secondary | ICD-10-CM

## 2019-10-09 NOTE — Progress Notes (Signed)
   [place], [place]  [address] Phone:  [phone]   Fax:  [phone] PDMP not reviewed this encounter.Subjective:    Deleon Passe calls for refill of his Oxycodone/APAP 10/325 mg and OxyContin 20 mg.    Objective:   Indication for chronic opioid: CPS related to SCD Medication and dose:  Oxycodone/APAP 10/325 mg and OxyContin 20 mg # pills per month: 120 each Last UDS date: 08/26/19 Opioid Treatment Agreement signed (Y/N): Y Opioid Treatment Agreement last reviewed with patient:  Y NCCSRS reviewed this encounter (include red flags):  N   Assessment:   Chronic Pain Syndrome related to Sickle Cell Disease    Plan:   Oxycodone/APAP 10/325 mg and OxyContin 20 mg.  Return for follow-up appointment as scheduled.

## 2019-10-10 ENCOUNTER — Non-Acute Institutional Stay (HOSPITAL_COMMUNITY)
Admission: AD | Admit: 2019-10-10 | Discharge: 2019-10-10 | Disposition: A | Discharge status: Home / Self Care | Payer: Medicaid Other | Source: Ambulatory Visit | Attending: Internal Medicine | Admitting: Internal Medicine

## 2019-10-10 ENCOUNTER — Telehealth: Payer: Self-pay | Admitting: Nurse Practitioner

## 2019-10-10 ENCOUNTER — Encounter (HOSPITAL_COMMUNITY): Payer: Self-pay | Admitting: Family Medicine

## 2019-10-10 ENCOUNTER — Telehealth (HOSPITAL_COMMUNITY): Payer: Self-pay | Admitting: *Deleted

## 2019-10-10 DIAGNOSIS — F1120 Opioid dependence, uncomplicated: Secondary | ICD-10-CM | POA: Diagnosis not present

## 2019-10-10 DIAGNOSIS — Z79899 Other long term (current) drug therapy: Secondary | ICD-10-CM | POA: Diagnosis not present

## 2019-10-10 DIAGNOSIS — D5700 Hb-SS disease with crisis, unspecified: Secondary | ICD-10-CM | POA: Diagnosis not present

## 2019-10-10 DIAGNOSIS — G894 Chronic pain syndrome: Secondary | ICD-10-CM | POA: Insufficient documentation

## 2019-10-10 DIAGNOSIS — Z7901 Long term (current) use of anticoagulants: Secondary | ICD-10-CM | POA: Insufficient documentation

## 2019-10-10 DIAGNOSIS — D638 Anemia in other chronic diseases classified elsewhere: Secondary | ICD-10-CM | POA: Diagnosis not present

## 2019-10-10 LAB — CBC WITH DIFFERENTIAL/PLATELET
Abs Immature Granulocytes: 0.02 10*3/uL (ref 0.00–0.07)
Basophils Absolute: 0 10*3/uL (ref 0.0–0.1)
Basophils Relative: 0 %
Eosinophils Absolute: 0.2 10*3/uL (ref 0.0–0.5)
Eosinophils Relative: 2 %
HCT: 36.9 % — ABNORMAL LOW (ref 39.0–52.0)
Hemoglobin: 12.4 g/dL — ABNORMAL LOW (ref 13.0–17.0)
Immature Granulocytes: 0 %
Lymphocytes Relative: 25 %
Lymphs Abs: 1.9 10*3/uL (ref 0.7–4.0)
MCH: 27.5 pg (ref 26.0–34.0)
MCHC: 33.6 g/dL (ref 30.0–36.0)
MCV: 81.8 fL (ref 80.0–100.0)
Monocytes Absolute: 0.5 10*3/uL (ref 0.1–1.0)
Monocytes Relative: 7 %
Neutro Abs: 4.9 10*3/uL (ref 1.7–7.7)
Neutrophils Relative %: 66 %
Platelets: 528 10*3/uL — ABNORMAL HIGH (ref 150–400)
RBC: 4.51 MIL/uL (ref 4.22–5.81)
RDW: 20.5 % — ABNORMAL HIGH (ref 11.5–15.5)
WBC: 7.5 10*3/uL (ref 4.0–10.5)
nRBC: 0.4 % — ABNORMAL HIGH (ref 0.0–0.2)

## 2019-10-10 LAB — COMPREHENSIVE METABOLIC PANEL
ALT: 13 U/L (ref 0–44)
AST: 24 U/L (ref 15–41)
Albumin: 4.2 g/dL (ref 3.5–5.0)
Alkaline Phosphatase: 73 U/L (ref 38–126)
Anion gap: 8 (ref 5–15)
BUN: 9 mg/dL (ref 6–20)
CO2: 25 mmol/L (ref 22–32)
Calcium: 9.1 mg/dL (ref 8.9–10.3)
Chloride: 107 mmol/L (ref 98–111)
Creatinine, Ser: 0.6 mg/dL — ABNORMAL LOW (ref 0.61–1.24)
GFR calc Af Amer: >60 mL/min (ref >60)
GFR calc non Af Amer: >60 mL/min (ref >60)
Glucose, Bld: 134 mg/dL — ABNORMAL HIGH (ref 70–99)
Potassium: 3.4 mmol/L — ABNORMAL LOW (ref 3.5–5.1)
Sodium: 140 mmol/L (ref 135–145)
Total Bilirubin: 2.5 mg/dL — ABNORMAL HIGH (ref 0.3–1.2)
Total Protein: 7.9 g/dL (ref 6.5–8.1)

## 2019-10-10 LAB — RETICULOCYTES
Immature Retic Fract: 32.2 % — ABNORMAL HIGH (ref 2.3–15.9)
RBC.: 4.54 MIL/uL (ref 4.22–5.81)
Retic Count, Absolute: 118.9 10*3/uL (ref 19.0–186.0)
Retic Ct Pct: 2.6 % (ref 0.4–3.1)

## 2019-10-10 MED ORDER — HYDROMORPHONE 1 MG/ML IV SOLN
INTRAVENOUS | Status: DC
  Administered 2019-10-10: 30 mg via INTRAVENOUS
  Administered 2019-10-10: 15 mg via INTRAVENOUS
  Filled 2019-10-10: qty 30

## 2019-10-10 MED ORDER — KETOROLAC TROMETHAMINE 30 MG/ML IJ SOLN
15.0000 mg | Freq: Once | INTRAMUSCULAR | Status: AC
  Administered 2019-10-10: 15 mg via INTRAVENOUS
  Filled 2019-10-10: qty 1

## 2019-10-10 MED ORDER — SODIUM CHLORIDE 0.45 % IV SOLN: INTRAVENOUS | Status: DC

## 2019-10-10 MED ORDER — ACETAMINOPHEN 500 MG PO TABS
1000.0000 mg | ORAL_TABLET | Freq: Once | ORAL | Status: AC
  Administered 2019-10-10: 1000 mg via ORAL
  Filled 2019-10-10: qty 2

## 2019-10-10 MED ORDER — OXYCODONE-ACETAMINOPHEN 10-325 MG PO TABS: 1.0000 | ORAL_TABLET | Freq: Four times a day (QID) | ORAL | 0 refills | Status: DC | PRN

## 2019-10-10 MED ORDER — ONDANSETRON HCL 4 MG/2ML IJ SOLN
4.0000 mg | Freq: Four times a day (QID) | INTRAMUSCULAR | Status: DC | PRN
  Administered 2019-10-10: 4 mg via INTRAVENOUS
  Filled 2019-10-10: qty 2

## 2019-10-10 MED ORDER — OXYCONTIN 20 MG PO T12A: 20.0000 mg | EXTENDED_RELEASE_TABLET | Freq: Two times a day (BID) | ORAL | 0 refills | Status: DC

## 2019-10-10 MED ORDER — NALOXONE HCL 0.4 MG/ML IJ SOLN: 0.4000 mg | INTRAMUSCULAR | Status: DC | PRN

## 2019-10-10 MED ORDER — SODIUM CHLORIDE 0.9% FLUSH: 9.0000 mL | INTRAVENOUS | Status: DC | PRN

## 2019-10-10 MED ORDER — DIPHENHYDRAMINE HCL 25 MG PO CAPS
25.0000 mg | ORAL_CAPSULE | Freq: Once | ORAL | Status: AC
  Administered 2019-10-10: 25 mg via ORAL
  Filled 2019-10-10: qty 1

## 2019-10-10 NOTE — H&P (Signed)
[facility] History and Physical   Date: 10/10/2019  Patient name: Timothy Marks Medical record number: [id number] Date of birth: Apr 12, 1990 Age: 29 y.o. Gender: male PCP: Vevelyn Francois, NP  Attending physician: Tresa Garter, MD  Chief Complaint: Sickle cell pain  History of Present Illness: Timothy Marks is a 29 year old male with a medical history significant for sickle cell disease, chronic pain syndrome, opiate dependence and tolerance, history of oppositional defiance, and history of anemia of chronic disease presents complaining of low back pain that is consistent with his typical pain crisis.  Patient reports increased pain to lower back over the past 2 days.  He attributes pain crisis to running out of OxyContin and Percocet.  Patient says that he is unable to fill prescriptions prior to 10/14/2019.  He admits to taking more medication than prescribed due to increased low back pain.  He says that he last had ibuprofen this a.m. without sustained relief.  Pain is characterized as constant and aching.  He denies any headache, chest pain, urinary symptoms, nausea, vomiting, or diarrhea.  No sick contacts, recent travel, or exposure to COVID-19.  Meds: Medications Prior to Admission  Medication Sig Dispense Refill Last Dose  . acetaminophen (TYLENOL) 500 MG tablet Take 500-1,000 mg by mouth every 6 (six) hours as needed for mild pain or headache.     . cholecalciferol (VITAMIN D) 1000 units tablet Take 2 tablets (2,000 Units total) by mouth daily. (Patient not taking: Reported on 08/30/2019) 30 tablet 6   . folic acid (FOLVITE) 1 MG tablet Take 1 tablet (1 mg total) by mouth daily. (Patient not taking: Reported on 08/30/2019) 90 tablet 2   . gabapentin (NEURONTIN) 300 MG capsule Take 1 capsule (300 mg total) by mouth 3 (three) times daily. (Patient taking differently: Take 300 mg by mouth 2 (two) times daily. ) 90 capsule 5   . hydroxyurea (HYDREA) 500 MG capsule Take  1 capsule (500 mg total) by mouth 2 (two) times daily. May take with food to minimize GI side effects. (Patient not taking: Reported on 08/30/2019) 180 capsule 2   . Nutritional Supplements (ENSURE ACTIVE HIGH PROTEIN) LIQD Take 1 each by mouth 3 (three) times daily between meals. #90 cans of Ensure (Patient not taking: Reported on 08/30/2019) 237 mL 11   . oxyCODONE-acetaminophen (PERCOCET) 10-325 MG tablet Take 1 tablet by mouth every 6 (six) hours as needed for up to 15 days for pain. 60 tablet 0   . OXYCONTIN 20 MG 12 hr tablet Take 1 tablet (20 mg total) by mouth every 12 (twelve) hours. 60 tablet 0   . RIVAROXABAN (XARELTO) VTE STARTER PACK (15 & 20 MG TABLETS) Follow package directions: Take one 15mg  tablet by mouth twice a day. On day 22, switch to one 20mg  tablet once a day. Take with food. 51 each 0     Allergies: Buprenorphine hcl, Levaquin [levofloxacin], Meperidine, Morphine, Morphine and related, Toradol [ketorolac tromethamine], Tramadol, Vancomycin, and Zosyn [piperacillin sod-tazobactam so] Past Medical History:  Diagnosis Date  . Depression   . Generalized weakness 03/31/2019  . 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 Sickle cell anemia (HCC)   . Vitamin D deficiency y-6   Past Surgical History:  Procedure Laterality Date  . CHOLECYSTECTOMY     Family History  Problem Relation Age of Onset  . Sickle cell trait Mother   . Sickle cell trait Father  Social History   Socioeconomic History  . Marital status: Single    Spouse name: Not on file  . Number of children: Not on file  . Years of education: Not on file  . Highest education level: Not on file  Occupational History  . Not on file  Tobacco Use  . Smoking status: Never Smoker  . Smokeless tobacco: Never Used  Vaping Use  . Vaping Use: Never used  Substance and Sexual Activity  . Alcohol use: No  . Drug use: Not Currently    Types: Marijuana  .  Sexual activity: Not on file  Other Topics Concern  . Not on file  Social History Narrative  . Not on file   Social Determinants of Health   Financial Resource Strain:   . Difficulty of Paying Living Expenses: Not on file  Food Insecurity:   . Worried About Charity fundraiser in the Last Year: Not on file  . Ran Out of Food in the Last Year: Not on file  Transportation Needs:   . Lack of Transportation (Medical): Not on file  . Lack of Transportation (Non-Medical): Not on file  Physical Activity:   . Days of Exercise per Week: Not on file  . Minutes of Exercise per Session: Not on file  Stress:   . Feeling of Stress : Not on file  Social Connections:   . Frequency of Communication with Friends and Family: Not on file  . Frequency of Social Gatherings with Friends and Family: Not on file  . Attends Religious Services: Not on file  . Active Member of Clubs or Organizations: Not on file  . Attends Archivist Meetings: Not on file  . Marital Status: Not on file  Intimate Partner Violence:   . Fear of Current or Ex-Partner: Not on file  . Emotionally Abused: Not on file  . Physically Abused: Not on file  . Sexually Abused: Not on file   Review of Systems  Constitutional: Negative for chills and fever.  HENT: Negative.   Eyes: Negative.   Respiratory: Negative.  Negative for shortness of breath.   Cardiovascular: Negative for chest pain and palpitations.  Gastrointestinal: Negative.   Genitourinary: Negative.  Negative for dysuria and hematuria.  Musculoskeletal: Positive for back pain and joint pain.  Skin: Negative.   Neurological: Negative.   Psychiatric/Behavioral: Negative.      Physical Exam: There were no vitals taken for this visit.  There were no vitals taken for this visit.  General Appearance:    Alert, cooperative, no distress, appears stated age  Head:    Normocephalic, without obvious abnormality, atraumatic  Eyes:    PERRL,  conjunctiva/corneas clear, EOM's intact, fundi    benign, both eyes       Neck:   Supple, symmetrical, trachea midline, no adenopathy;       thyroid:  No enlargement/tenderness/nodules; no carotid   bruit or JVD  Back:     Symmetric, no curvature, ROM normal, no CVA tenderness  Lungs:     Clear to auscultation bilaterally, respirations unlabored  Chest wall:    No tenderness or deformity  Heart:    Regular rate and rhythm, S1 and S2 normal, no murmur, rub   or gallop  Abdomen:     Soft, non-tender, bowel sounds active all four quadrants,    no masses, no organomegaly  Extremities:   Extremities normal, atraumatic, no cyanosis or edema  Pulses:   2+ and symmetric all extremities  Skin:   Skin color, texture, turgor normal, no rashes or lesions  Lymph nodes:   Cervical, supraclavicular, and axillary nodes normal  Neurologic:   CNII-XII intact. Normal strength, sensation and reflexes      throughout   Lab results: No results found for this or any previous visit (from the past 24 hour(s)).  Imaging results:  No results found.   Assessment & Plan: Patient admitted to sickle cell day infusion center for management of pain crisis.  Patient is opiate tolerant Initiate IV dilaudid PCA. Settings of 0.5 mg, 10 minute lockout, and 3 mg/hr IV fluids, 0.45 % saline at 100 ml/hr Toradol 15 mg IV times one dose Tylenol 1000 mg by mouth times one dose Review CBC with differential, complete metabolic panel, and reticulocytes as results become available.  Pain intensity will be reevaluated in context of functioning and relationship to baseline as care progress If pain intensity remains elevated and/or sudden change in hemodynamic stability transition to inpatient services for higher level of care.       [name]  APRN, MSN, FNP-C [facility] [address] [place], [place] [address] [phone]  10/10/2019, 9:03 AM

## 2019-10-10 NOTE — Progress Notes (Signed)
Patient admitted to the day hospital for treatment of sickle cell pain crisis. Patient reported pain rated 9/10 in the back . Patient placed on Dilaudid PCA, given IV Toradol, IV zofran, PO tylenol and hydrated with IV fluids. At discharge patient reported  pain at 5/10. Discharge instructions given to the patient. Patient alert, oriented and ambulatory at discharge.

## 2019-10-10 NOTE — Discharge Summary (Signed)
[facility] Discharge Summary   Patient ID: Bronc Brosseau MRN: [id number] DOB/AGE: 1990/09/05 29 y.o.  Admit date: 10/10/2019 Discharge date: 10/10/2019  Primary Care Physician:  Vevelyn Francois, NP  Admission Diagnoses:  Active Problems:   Sickle cell pain crisis [facility])   Discharge Medications:  Allergies as of 10/10/2019      Reactions   Buprenorphine Hcl Hives   Levaquin [levofloxacin] Itching   Meperidine Rash   Morphine Hives   Morphine And Related Hives   Toradol [ketorolac Tromethamine] Itching   Tramadol Hives   Vancomycin Itching   Zosyn [piperacillin Sod-tazobactam So] Itching, Rash, Other (See Comments)   Has taken rocephin in the past      Medication List    TAKE these medications   acetaminophen 500 MG tablet Commonly known as: TYLENOL Take 500-1,000 mg by mouth every 6 (six) hours as needed for mild pain or headache.   cholecalciferol 25 MCG (1000 UNIT) tablet Commonly known as: VITAMIN D Take 2 tablets (2,000 Units total) by mouth daily.   Ensure Active High Protein Liqd Take 1 each by mouth 3 (three) times daily between meals. #90 cans of Ensure   folic acid 1 MG tablet Commonly known as: FOLVITE Take 1 tablet (1 mg total) by mouth daily.   gabapentin 300 MG capsule Commonly known as: NEURONTIN Take 1 capsule (300 mg total) by mouth 3 (three) times daily. What changed: when to take this   hydroxyurea 500 MG capsule Commonly known as: HYDREA Take 1 capsule (500 mg total) by mouth 2 (two) times daily. May take with food to minimize GI side effects.   oxyCODONE-acetaminophen 10-325 MG tablet Commonly known as: Percocet Take 1 tablet by mouth every 6 (six) hours as needed for up to 15 days for pain.   OxyCONTIN 20 mg 12 hr tablet Generic drug: oxyCODONE Take 1 tablet (20 mg total) by mouth every 12 (twelve) hours.   Rivaroxaban Stater Pack (15 mg and 20 mg) Commonly known as: XARELTO STARTER PACK Follow package directions:  Take one 15mg  tablet by mouth twice a day. On day 22, switch to one 20mg  tablet once a day. Take with food.        Consults:  None  Significant Diagnostic Studies:  VAS [place] LOWER EXTREMITY VENOUS (DVT) (ONLY [facility] & [facility])  Result Date: 09/21/2019  Lower Venous DVTStudy Indications: Pain in left leg.  Comparison Study: No prior studies. Performing Technologist: Darlin Coco  Examination Guidelines: A complete evaluation includes B-mode imaging, spectral Doppler, color Doppler, and power Doppler as needed of all accessible portions of each vessel. Bilateral testing is considered an integral part of a complete examination. Limited examinations for reoccurring indications may be performed as noted. The reflux portion of the exam is performed with the patient in reverse Trendelenburg.  +-----+---------------+---------+-----------+----------+--------------+ RIGHTCompressibilityPhasicitySpontaneityPropertiesThrombus Aging +-----+---------------+---------+-----------+----------+--------------+ CFV  Full           Yes      Yes                                 +-----+---------------+---------+-----------+----------+--------------+   +---------+---------------+---------+-----------+----------+--------------+ LEFT     CompressibilityPhasicitySpontaneityPropertiesThrombus Aging +---------+---------------+---------+-----------+----------+--------------+ CFV      Full           Yes      Yes                                 +---------+---------------+---------+-----------+----------+--------------+  SFJ      Full                                                        +---------+---------------+---------+-----------+----------+--------------+ FV Prox  Full                                                        +---------+---------------+---------+-----------+----------+--------------+ FV Mid   Full                                                         +---------+---------------+---------+-----------+----------+--------------+ FV DistalFull                                                        +---------+---------------+---------+-----------+----------+--------------+ PFV      Full                                                        +---------+---------------+---------+-----------+----------+--------------+ POP      None           No       No                   Acute          +---------+---------------+---------+-----------+----------+--------------+ PTV      Full                                                        +---------+---------------+---------+-----------+----------+--------------+ PERO     None           No       No                   Acute          +---------+---------------+---------+-----------+----------+--------------+     Summary: RIGHT: - No evidence of common femoral vein obstruction.  LEFT: - Findings consistent with acute deep vein thrombosis involving the left popliteal vein, and left peroneal veins. - No cystic structure found in the popliteal fossa.  *See table(s) above for measurements and observations. Electronically signed by Harold Barban MD on 09/21/2019 at 1:17:22 AM.    Final    History of present illness:   Taiwo Fish is a 29 year old male with a medical history significant for sickle cell disease, chronic pain syndrome, opiate dependence and tolerance, history of oppositional defiance, and history of anemia of chronic disease presents complaining of low back pain that is consistent with his typical pain crisis.  Patient reports increased pain to lower back over the past 2 days.  He attributes pain crisis to running out of OxyContin and Percocet.  Patient says that he is unable to fill prescriptions prior to 10/14/2019.  He admits to taking more medication than prescribed due to increased low back pain.  He says that he last had ibuprofen this a.m. without sustained relief.  Pain is  characterized as constant and aching.  He denies any headache, chest pain, urinary symptoms, nausea, vomiting, or diarrhea.  No sick contacts, recent travel, or exposure to COVID-19.   [facility] Course: Patient admitted to sickle cell clinic for management and pain crisis.  All laboratory values reviewed, consistent with baseline. IV Dilaudid PCA with settings of 0.5 mg, 10-minute lockout, and 3 mg/h. Tylenol 1000 mg x 1 IV fluids, 0.45% saline at 100 mL/h Pain intensity decreased to 5/10.  Patient does not warrant admission at this time.  He is alert, oriented, and ambulating without assistance.  He will discharged home in a hemodynamically stable condition.  He is also advised to follow-up with his PCP for medication management.  Discharge instructions: Resume all home medications.   Follow up with PCP as previously  scheduled.   Discussed the importance of drinking 64 ounces of water daily, dehydration of red blood cells may lead further sickling.   Avoid all stressors that precipitate sickle cell pain crisis.     The patient was given clear instructions to go to ER or return to medical center if symptoms do not improve, worsen or new problems develop.     Physical Exam at Discharge:  BP (!) 137/54 (BP Location: Left Arm)   Pulse 60   Temp 98.6 F (37 C) (Oral)   Resp 10   SpO2 100%   Physical Exam Constitutional:      Appearance: Normal appearance.  Eyes:     Pupils: Pupils are equal, round, and reactive to light.  Cardiovascular:     Rate and Rhythm: Normal rate and regular rhythm.     Pulses: Normal pulses.  Pulmonary:     Effort: Pulmonary effort is normal.     Breath sounds: Normal breath sounds.  Abdominal:     General: Bowel sounds are normal.  Musculoskeletal:        General: Normal range of motion.  Skin:    General: Skin is warm.  Neurological:     General: No focal deficit present.     Mental Status: He is alert. Mental status is at  baseline.  Psychiatric:        Mood and Affect: Mood normal.        Behavior: Behavior normal.        Thought Content: Thought content normal.        Judgment: Judgment normal.      Disposition at Discharge: Discharge disposition: 01-Home or Self Care       Discharge Orders: Discharge Instructions    Discharge patient   Complete by: As directed    Discharge disposition: 01-Home or Self Care   Discharge patient date: 10/10/2019      Condition at Discharge:   Stable  Time spent on Discharge:  Greater than 30 minutes.  Signed: [name]  APRN, MSN, FNP-C Patient [facility] [address] [place], [place] [address] [phone]  10/10/2019, 3:44 PM

## 2019-10-10 NOTE — Discharge Instructions (Signed)
Sickle Cell Anemia, Adult  Sickle cell anemia is a condition where your red blood cells are shaped like sickles. Red blood cells carry oxygen through the body. Sickle-shaped cells do not live as long as normal red blood cells. They also clump together and block blood from flowing through the blood vessels. This prevents the body from getting enough oxygen. Sickle cell anemia causes organ damage and pain. It also increases the risk of infection. Follow these instructions at home: Medicines  Take over-the-counter and prescription medicines only as told by your doctor.  If you were prescribed an antibiotic medicine, take it as told by your doctor. Do not stop taking the antibiotic even if you start to feel better.  If you develop a fever, do not take medicines to lower the fever right away. Tell your doctor about the fever. Managing pain, stiffness, and swelling  Try these methods to help with pain: ? Use a heating pad. ? Take a warm bath. ? Distract yourself, such as by watching TV. Eating and drinking  Drink enough fluid to keep your pee (urine) clear or pale yellow. Drink more in hot weather and during exercise.  Limit or avoid alcohol.  Eat a healthy diet. Eat plenty of fruits, vegetables, whole grains, and lean protein.  Take vitamins and supplements as told by your doctor. Traveling  When traveling, keep these with you: ? Your medical information. ? The names of your doctors. ? Your medicines.  If you need to take an airplane, talk to your doctor first. Activity  Rest often.  Avoid exercises that make your heart beat much faster, such as jogging. General instructions  Do not use products that have nicotine or tobacco, such as cigarettes and e-cigarettes. If you need help quitting, ask your doctor.  Consider wearing a medical alert bracelet.  Avoid being in high places (high altitudes), such as mountains.  Avoid very hot or cold temperatures.  Avoid places where the  temperature changes a lot.  Keep all follow-up visits as told by your doctor. This is important. Contact a doctor if:  A joint hurts.  Your feet or hands hurt or swell.  You feel tired (fatigued). Get help right away if:  You have symptoms of infection. These include: ? Fever. ? Chills. ? Being very tired. ? Irritability. ? Poor eating. ? Throwing up (vomiting).  You feel dizzy or faint.  You have new stomach pain, especially on the left side.  You have a an erection (priapism) that lasts more than 4 hours.  You have numbness in your arms or legs.  You have a hard time moving your arms or legs.  You have trouble talking.  You have pain that does not go away when you take medicine.  You are short of breath.  You are breathing fast.  You have a long-term cough.  You have pain in your chest.  You have a bad headache.  You have a stiff neck.  Your stomach looks bloated even though you did not eat much.  Your skin is pale.  You suddenly cannot see well. Summary  Sickle cell anemia is a condition where your red blood cells are shaped like sickles.  Follow your doctor's advice on ways to manage pain, food to eat, activities to do, and steps to take for safe travel.  Get medical help right away if you have any signs of infection, such as a fever. This information is not intended to replace advice given to you by   your health care provider. Make sure you discuss any questions you have with your health care provider. Document Revised: 05/25/2018 Document Reviewed: 03/08/2016 Elsevier Patient Education  2020 Elsevier Inc.  

## 2019-10-10 NOTE — Telephone Encounter (Signed)
Patient called requesting to come to the day hospital for sickle cell pain. Patient reports back pain rated 9/10. Reports running out of Percocet two days ago and not being able to fill it until this weekend. COVID-19 screening done and patient denies all symptoms and exposures. Denies fever, chest pain, nausea,vomiting, diarrhea, abdominal pain and priapism. Admits to having transportation at discharge without driving self. [place], [place] notified. Patient can come to the day hospital for treatment. Patient advised and expresses an understanding.

## 2019-10-10 NOTE — Telephone Encounter (Signed)
rx refill for oxycontin 20 mg & oxycodone 10 mg

## 2019-10-11 NOTE — Telephone Encounter (Signed)
Done

## 2019-10-13 ENCOUNTER — Encounter (HOSPITAL_COMMUNITY): Payer: Self-pay | Admitting: *Deleted

## 2019-10-13 ENCOUNTER — Other Ambulatory Visit: Payer: Self-pay

## 2019-10-13 ENCOUNTER — Emergency Department (HOSPITAL_COMMUNITY)
Admission: EM | Admit: 2019-10-13 | Discharge: 2019-10-13 | Disposition: A | Discharge status: Home / Self Care | Payer: Medicaid Other | Attending: Emergency Medicine | Admitting: Emergency Medicine

## 2019-10-13 DIAGNOSIS — M79631 Pain in right forearm: Secondary | ICD-10-CM | POA: Diagnosis present

## 2019-10-13 DIAGNOSIS — D5700 Hb-SS disease with crisis, unspecified: Secondary | ICD-10-CM | POA: Diagnosis not present

## 2019-10-13 DIAGNOSIS — Z79899 Other long term (current) drug therapy: Secondary | ICD-10-CM | POA: Diagnosis not present

## 2019-10-13 LAB — COMPREHENSIVE METABOLIC PANEL
ALT: 8 U/L (ref 0–44)
AST: 41 U/L (ref 15–41)
Albumin: 4.1 g/dL (ref 3.5–5.0)
Alkaline Phosphatase: 75 U/L (ref 38–126)
Anion gap: 8 (ref 5–15)
BUN: 5 mg/dL — ABNORMAL LOW (ref 6–20)
CO2: 25 mmol/L (ref 22–32)
Calcium: 9 mg/dL (ref 8.9–10.3)
Chloride: 104 mmol/L (ref 98–111)
Creatinine, Ser: 0.57 mg/dL — ABNORMAL LOW (ref 0.61–1.24)
GFR calc Af Amer: >60 mL/min (ref >60)
GFR calc non Af Amer: >60 mL/min (ref >60)
Glucose, Bld: 90 mg/dL (ref 70–99)
Potassium: 5.4 mmol/L — ABNORMAL HIGH (ref 3.5–5.1)
Sodium: 137 mmol/L (ref 135–145)
Total Bilirubin: 2.8 mg/dL — ABNORMAL HIGH (ref 0.3–1.2)
Total Protein: 7.5 g/dL (ref 6.5–8.1)

## 2019-10-13 LAB — CBC WITH DIFFERENTIAL/PLATELET
Abs Immature Granulocytes: 0.04 10*3/uL (ref 0.00–0.07)
Basophils Absolute: 0.1 10*3/uL (ref 0.0–0.1)
Basophils Relative: 1 %
Eosinophils Absolute: 0.4 10*3/uL (ref 0.0–0.5)
Eosinophils Relative: 4 %
HCT: 37 % — ABNORMAL LOW (ref 39.0–52.0)
Hemoglobin: 12 g/dL — ABNORMAL LOW (ref 13.0–17.0)
Immature Granulocytes: 0 %
Lymphocytes Relative: 40 %
Lymphs Abs: 4.3 10*3/uL — ABNORMAL HIGH (ref 0.7–4.0)
MCH: 27 pg (ref 26.0–34.0)
MCHC: 32.4 g/dL (ref 30.0–36.0)
MCV: 83.3 fL (ref 80.0–100.0)
Monocytes Absolute: 0.9 10*3/uL (ref 0.1–1.0)
Monocytes Relative: 9 %
Neutro Abs: 5.1 10*3/uL (ref 1.7–7.7)
Neutrophils Relative %: 46 %
Platelets: 356 10*3/uL (ref 150–400)
RBC: 4.44 MIL/uL (ref 4.22–5.81)
RDW: 21.4 % — ABNORMAL HIGH (ref 11.5–15.5)
WBC: 10.8 10*3/uL — ABNORMAL HIGH (ref 4.0–10.5)
nRBC: 0.8 % — ABNORMAL HIGH (ref 0.0–0.2)

## 2019-10-13 LAB — RETICULOCYTES
Immature Retic Fract: 42.8 % — ABNORMAL HIGH (ref 2.3–15.9)
RBC.: 4.36 MIL/uL (ref 4.22–5.81)
Retic Count, Absolute: 156.5 10*3/uL (ref 19.0–186.0)
Retic Ct Pct: 3.6 % — ABNORMAL HIGH (ref 0.4–3.1)

## 2019-10-13 MED ORDER — HYDROMORPHONE HCL 1 MG/ML IJ SOLN
2.0000 mg | Freq: Once | INTRAMUSCULAR | Status: AC
  Administered 2019-10-13: 2 mg via INTRAVENOUS
  Filled 2019-10-13: qty 2

## 2019-10-13 MED ORDER — ACETAMINOPHEN 500 MG PO TABS
1000.0000 mg | ORAL_TABLET | Freq: Once | ORAL | Status: AC
  Administered 2019-10-13: 1000 mg via ORAL
  Filled 2019-10-13: qty 2

## 2019-10-13 MED ORDER — HYDROMORPHONE HCL 1 MG/ML IJ SOLN
2.0000 mg | INTRAMUSCULAR | Status: AC
  Administered 2019-10-13: 2 mg via INTRAVENOUS
  Filled 2019-10-13: qty 2

## 2019-10-13 NOTE — Discharge Instructions (Addendum)
Please begin taking your Xarelto immediately.  Please return to the emergency department if you develop new or worsening symptoms.

## 2019-10-13 NOTE — ED Provider Notes (Signed)
[facility] EMERGENCY DEPARTMENT Provider Note   CSN: [id number] Arrival date & time: 10/13/19  0503     History Chief Complaint  Patient presents with  . Sickle Cell Pain Crisis    Timothy Marks is a 29 y.o. male.  HPI Patient is a 29 year old male with a history of depression, sickle cell anemia, DVT anticoagulated on Xarelto.  Patient states that 2 days ago he began experiencing worsening pain diffusely along the right forearm, low back, bilateral lower extremities.  Pain worsens with palpation.  He has been taking Tylenol without significant relief.  His last dose was more than 12 hours ago.  Patient was supposed to refill his Percocet today but states that "it was sent to the wrong pharmacy".  He presents to the emergency department today for pain management.  It is the weekend and the sickle cell pain center is currently closed.  He denies headaches, weakness, chest pain, shortness of breath, abdominal pain, nausea, vomiting.    Past Medical History:  Diagnosis Date  . Depression   . Generalized weakness 03/31/2019  . 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 Sickle cell anemia (HCC)   . Vitamin D deficiency y-6    Patient Active Problem List   Diagnosis Date Noted  . Underweight 06/14/2019  . Generalized weakness 03/31/2019  . Sickle-cell crisis ([place]) 03/24/2019  . SIRS (systemic inflammatory response syndrome) ([place]) 02/10/2019  . Abnormal liver function 11/04/2018  . Sickle cell anemia with crisis ([place]) 05/22/2018  . Narcotic dependence ([place]) 02/10/2018  . Acute bilateral low back pain without sciatica   . Other chronic pain   . Anemia   . Socially inappropriate behavior 12/02/2017  . 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 Physical deconditioning   . Agitation   . Chronic pain syndrome   . Recurrent cold sores   . Fever   . Drug-seeking behavior 08/30/2017  . Pain   . Sickle cell crisis ([place])  07/16/2017  . Adjustment disorder with mixed disturbance of emotions and conduct   . Bilateral pulmonary infiltrates on chest x-ray 11/21/2016  . History of DVT (deep vein thrombosis) 11/17/2016  . Suicidal ideations 10/11/2016  . Reticulocytosis 10/09/2016  . DVT (deep venous thrombosis) ([place]) 08/02/2016  . Has difficulty accessing primary care provider for most visits 07/11/2015  . 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 Hordeolum externum of left upper eyelid 05/31/2015  . Major depressive disorder with current active episode 11/09/2014  . 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 Pneumonia of right upper lobe due to infectious organism 11/07/2014  . Constipation 08/03/2014  . Leukocytosis 06/30/2014  . 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 Protein-calorie malnutrition, severe ([place]) 04/09/2014  . Major depressive disorder, recurrent, severe without psychotic features ([place])   . 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 Vitamin D deficiency   . Sickle cell anemia ([place]) 03/30/2014  . Hyperbilirubinemia 03/30/2014  . Hypokalemia 02/07/2014  . Mandible fracture ([place]) 05/03/2013  . Aggressive behavior 01/25/2013  . 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 Sickle cell anemia with pain ([place]) 01/24/2013  . Sickle cell pain crisis ([place]) 01/24/2013  . 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 CHOLECYSTECTOMY  Family History  Problem Relation Age of Onset  . Sickle cell trait Mother   . Sickle cell trait Father     Social History   Tobacco Use  . Smoking status: Never Smoker  . Smokeless tobacco: Never Used  Vaping Use  . Vaping Use: Never used  Substance Use Topics  . Alcohol use: No  . Drug use: Not Currently    Types:  Marijuana    Home Medications Prior to Admission medications   Medication Sig Start Date End Date Taking? Authorizing Provider  acetaminophen (TYLENOL) 500 MG tablet Take 500-1,000 mg by mouth every 6 (six) hours as needed for mild pain or headache.   Yes [provider]  cholecalciferol (VITAMIN D) 1000 units tablet Take 2 tablets (2,000 Units total) by mouth daily. 12/13/17  Yes Azzie Glatter, FNP  doxycycline (VIBRA-TABS) 100 MG tablet Take 100 mg by mouth 2 (two) times daily. 10/07/19  Yes [provider]  folic acid (FOLVITE) 1 MG tablet Take 1 tablet (1 mg total) by mouth daily. 05/01/17  Yes Dorena Dew, FNP  gabapentin (NEURONTIN) 300 MG capsule Take 1 capsule (300 mg total) by mouth 3 (three) times daily. 03/12/19  Yes Dorena Dew, FNP  hydroxyurea (HYDREA) 500 MG capsule Take 1 capsule (500 mg total) by mouth 2 (two) times daily. May take with food to minimize GI side effects. Patient taking differently: Take 500 mg by mouth 2 (two) times daily.  05/01/17  Yes Dorena Dew, FNP  Nutritional Supplements (ENSURE ACTIVE HIGH PROTEIN) LIQD Take 1 each by mouth 3 (three) times daily between meals. #90 cans of Ensure Patient taking differently: Take 1 each by mouth 3 (three) times daily between meals.  11/13/18  Yes Dorena Dew, FNP  oxyCODONE-acetaminophen (PERCOCET) 10-325 MG tablet Take 1 tablet by mouth every 6 (six) hours as needed for up to 15 days for pain. 10/13/19 10/28/19 Yes Vevelyn Francois, NP  RIVAROXABAN [employer]) VTE STARTER PACK (15 & 20 MG TABLETS) Follow package directions: Take one 15mg  tablet by mouth twice a day. On day 22, switch to one 20mg  tablet once a day. Take with food. 09/20/19  Yes Sherwood Gambler, MD  OXYCONTIN 20 MG 12 hr tablet Take 1 tablet (20 mg total) by mouth every 12 (twelve) hours. Patient not taking: Reported on 10/13/2019 10/18/19 11/17/19  Vevelyn Francois, NP    Allergies    Buprenorphine hcl, Levaquin  [levofloxacin], Meperidine, Morphine, Morphine and related, Toradol [ketorolac tromethamine], Tramadol, Vancomycin, and Zosyn [piperacillin sod-tazobactam so]  Review of Systems   Review of Systems  All other systems reviewed and are negative. Ten systems reviewed and are negative for acute change, except as noted in the HPI.   Physical Exam Updated Vital Signs BP 114/78 (BP Location: Left Arm)   Pulse 68   Temp 98.3 F (36.8 C) (Oral)   Resp 16   SpO2 99%   Physical Exam Vitals and nursing note reviewed.  Constitutional:      General: He is in acute distress.     Appearance: Normal appearance. He is not ill-appearing, toxic-appearing or diaphoretic.     Comments: Well-developed but cachectic adult male.  Lying in the semi-Fowlers position.  HENT:     Head: Normocephalic and atraumatic.     Right Ear: External ear normal.     Left Ear: External ear normal.     Nose: Nose normal.     Mouth/Throat:     Mouth:  Mucous membranes are moist.     Pharynx: Oropharynx is clear. No oropharyngeal exudate or posterior oropharyngeal erythema.  Eyes:     Extraocular Movements: Extraocular movements intact.  Cardiovascular:     Rate and Rhythm: Normal rate and regular rhythm.     Pulses: Normal pulses.     Heart sounds: Normal heart sounds. No murmur heard.  No friction rub. No gallop.   Pulmonary:     Effort: Pulmonary effort is normal. No respiratory distress.     Breath sounds: Normal breath sounds. No stridor. No wheezing, rhonchi or rales.  Abdominal:     General: Abdomen is flat.     Tenderness: There is no abdominal tenderness.  Musculoskeletal:        General: Tenderness present. Normal range of motion.     Cervical back: Normal range of motion and neck supple. No tenderness.     Comments: Mild tenderness noted diffusely along the right forearm.  No visible signs of trauma.  Mild tenderness noted diffusely along the lumbar spine.  Mild tenderness noted diffusely along the  bilateral lower extremities.  No visible leg swelling.  Palpable DP pulses noted bilaterally.  Skin:    General: Skin is warm and dry.  Neurological:     General: No focal deficit present.     Mental Status: He is alert and oriented to person, place, and time.  Psychiatric:        Mood and Affect: Mood normal.        Behavior: Behavior normal.    ED Results / Procedures / Treatments   Labs (all labs ordered are listed, but only abnormal results are displayed) Labs Reviewed  COMPREHENSIVE METABOLIC PANEL - Abnormal; Notable for the following components:      Result Value   Potassium 5.4 (*)    BUN 5 (*)    Creatinine, Ser 0.57 (*)    Total Bilirubin 2.8 (*)    All other components within normal limits  CBC WITH DIFFERENTIAL/PLATELET - Abnormal; Notable for the following components:   WBC 10.8 (*)    Hemoglobin 12.0 (*)    HCT 37.0 (*)    RDW 21.4 (*)    nRBC 0.8 (*)    Lymphs Abs 4.3 (*)    All other components within normal limits  RETICULOCYTES - Abnormal; Notable for the following components:   Retic Ct Pct 3.6 (*)    Immature Retic Fract 42.8 (*)    All other components within normal limits   EKG None  Radiology No results found.  Procedures Procedures (including critical care time)  Medications Ordered in ED Medications  HYDROmorphone (DILAUDID) injection 2 mg (2 mg Intravenous Given 10/13/19 1244)  HYDROmorphone (DILAUDID) injection 2 mg (2 mg Intravenous Given 10/13/19 1314)  acetaminophen (TYLENOL) tablet 1,000 mg (1,000 mg Oral Given 10/13/19 1244)  HYDROmorphone (DILAUDID) injection 2 mg (2 mg Intravenous Given 10/13/19 1409)    ED Course  I have reviewed the triage vital signs and the nursing notes.  Pertinent labs & imaging results that were available during my care of the patient were reviewed by me and considered in my medical decision making (see chart for details).  Clinical Course as of Oct 12 1512  Sun Oct 13, 2019  1207 Afebrile  Temp: 98.3 F  (36.8 C) [LJ]  1208 Not hypoxic  SpO2: 99 % [LJ]  1208 Not tachycardic  Pulse Rate: 68 [LJ]  1231 Similar to value 3 days ago. Improved from prior values  about 1 month ago.  Hemoglobin(!): 12.0 [LJ]    Clinical Course User Index [LJ] Rayna Sexton, PA-C   MDM Rules/Calculators/A&P                          Pt is a 29 y.o. male that presents with a history, physical exam, and ED Clinical Course as noted above.   Patient presents today due to sickle cell pain crisis.  Patient states his symptoms are consistent with prior pain crises.  His vital signs today are stable.  He is not hypoxic.  Normotensive.  Not tachycardic.  Afebrile.  Patient given APAP as well as multiple rounds of IV Dilaudid.  He notes moderate relief of his pain and is comfortable being discharged at this time.  He is planning on going to the sickle cell pain clinic tomorrow if his symptoms worsen once again.  He states that he was having issues getting his Percocet filled with his primary care provider and is planning on reaching out to his primary care provider to discuss.  He does note that he has not taken his Xarelto for the past few days because it is at his cousin's house.  He is not having any calf pain or swelling or any pain similar to his previously diagnosed DVT.  No CP or SOB. He states that he will go to his cousin's house later today to pick up his Xarelto and will resume taking it.  Also recommended that he reach out to his PCP to discuss his previous DVT as well as his Xarelto use.  Patient is hemodynamically stable and in NAD at the time of d/c. Evaluation does not show pathology that would require ongoing emergent intervention or inpatient treatment. I explained the diagnosis to the patient. Patient is comfortable with above plan and is stable for discharge at this time. All questions were answered prior to disposition. Strict return precautions for returning to the ED were discussed. Encouraged follow up  with PCP.    An After Visit Summary was printed and given to the patient.  Patient discharged to home/self care.  Condition at discharge: Stable  Note: Portions of this report may have been transcribed using voice recognition software. Every effort was made to ensure accuracy; however, inadvertent computerized transcription errors may be present.   Final Clinical Impression(s) / ED Diagnoses Final diagnoses:  Sickle cell pain crisis [facility])    Rx / DC Orders ED Discharge Orders    None       Rayna Sexton, PA-C 10/13/19 1515    Hayden Rasmussen, MD 10/13/19 2311

## 2019-10-13 NOTE — ED Triage Notes (Signed)
Pt states pain in his back and right arm. Reports as sickle cell pain. Took tylenol around 2200 for the same.

## 2019-10-14 MED FILL — OXYCODONE-APAP 10-325: 10-325 | 15 days supply | Qty: 60 | Fill #0

## 2019-10-15 ENCOUNTER — Telehealth (HOSPITAL_COMMUNITY): Payer: Self-pay | Admitting: *Deleted

## 2019-10-15 NOTE — Telephone Encounter (Signed)
Patient called requesting to come to the day hospital for sickle cell pain. Patient reports back and bilateral arm pain rated 9/10. Reports last taking Percocet yesterday at 10:00 pm but patient still has medication. [place], [place] notified. Day hospital is at capacity. Patient advised to continue to take home medications around the clock as prescribed for pain. Patient expresses an understanding.

## 2019-10-17 ENCOUNTER — Encounter (HOSPITAL_COMMUNITY): Payer: Self-pay | Admitting: Family Medicine

## 2019-10-17 ENCOUNTER — Telehealth (HOSPITAL_COMMUNITY): Payer: Self-pay | Admitting: General Practice

## 2019-10-17 ENCOUNTER — Non-Acute Institutional Stay (HOSPITAL_COMMUNITY)
Admission: AD | Admit: 2019-10-17 | Discharge: 2019-10-17 | Disposition: A | Discharge status: Home / Self Care | Payer: Medicaid Other | Source: Ambulatory Visit | Attending: Internal Medicine | Admitting: Internal Medicine

## 2019-10-17 DIAGNOSIS — D638 Anemia in other chronic diseases classified elsewhere: Secondary | ICD-10-CM | POA: Insufficient documentation

## 2019-10-17 DIAGNOSIS — Z79899 Other long term (current) drug therapy: Secondary | ICD-10-CM | POA: Insufficient documentation

## 2019-10-17 DIAGNOSIS — G894 Chronic pain syndrome: Secondary | ICD-10-CM | POA: Insufficient documentation

## 2019-10-17 DIAGNOSIS — I82432 Acute embolism and thrombosis of left popliteal vein: Secondary | ICD-10-CM | POA: Diagnosis not present

## 2019-10-17 DIAGNOSIS — F913 Oppositional defiant disorder: Secondary | ICD-10-CM | POA: Insufficient documentation

## 2019-10-17 DIAGNOSIS — F1120 Opioid dependence, uncomplicated: Secondary | ICD-10-CM | POA: Diagnosis not present

## 2019-10-17 DIAGNOSIS — D5700 Hb-SS disease with crisis, unspecified: Secondary | ICD-10-CM | POA: Diagnosis not present

## 2019-10-17 DIAGNOSIS — Z7901 Long term (current) use of anticoagulants: Secondary | ICD-10-CM | POA: Diagnosis not present

## 2019-10-17 LAB — RETICULOCYTES
Immature Retic Fract: 40.3 % — ABNORMAL HIGH (ref 2.3–15.9)
RBC.: 3.8 MIL/uL — ABNORMAL LOW (ref 4.22–5.81)
Retic Count, Absolute: 165.3 10*3/uL (ref 19.0–186.0)
Retic Ct Pct: 4.4 % — ABNORMAL HIGH (ref 0.4–3.1)

## 2019-10-17 LAB — COMPREHENSIVE METABOLIC PANEL
ALT: 12 U/L (ref 0–44)
AST: 19 U/L (ref 15–41)
Albumin: 4 g/dL (ref 3.5–5.0)
Alkaline Phosphatase: 72 U/L (ref 38–126)
Anion gap: 7 (ref 5–15)
BUN: 5 mg/dL — ABNORMAL LOW (ref 6–20)
CO2: 28 mmol/L (ref 22–32)
Calcium: 8.9 mg/dL (ref 8.9–10.3)
Chloride: 104 mmol/L (ref 98–111)
Creatinine, Ser: 0.43 mg/dL — ABNORMAL LOW (ref 0.61–1.24)
GFR calc Af Amer: >60 mL/min (ref >60)
GFR calc non Af Amer: >60 mL/min (ref >60)
Glucose, Bld: 85 mg/dL (ref 70–99)
Potassium: 3.7 mmol/L (ref 3.5–5.1)
Sodium: 139 mmol/L (ref 135–145)
Total Bilirubin: 1.6 mg/dL — ABNORMAL HIGH (ref 0.3–1.2)
Total Protein: 7.3 g/dL (ref 6.5–8.1)

## 2019-10-17 LAB — CBC WITH DIFFERENTIAL/PLATELET
Abs Immature Granulocytes: 0.03 10*3/uL (ref 0.00–0.07)
Basophils Absolute: 0 10*3/uL (ref 0.0–0.1)
Basophils Relative: 0 %
Eosinophils Absolute: 0.3 10*3/uL (ref 0.0–0.5)
Eosinophils Relative: 3 %
HCT: 31.9 % — ABNORMAL LOW (ref 39.0–52.0)
Hemoglobin: 10.8 g/dL — ABNORMAL LOW (ref 13.0–17.0)
Immature Granulocytes: 0 %
Lymphocytes Relative: 23 %
Lymphs Abs: 2.1 10*3/uL (ref 0.7–4.0)
MCH: 28.3 pg (ref 26.0–34.0)
MCHC: 33.9 g/dL (ref 30.0–36.0)
MCV: 83.5 fL (ref 80.0–100.0)
Monocytes Absolute: 0.6 10*3/uL (ref 0.1–1.0)
Monocytes Relative: 7 %
Neutro Abs: 6 10*3/uL (ref 1.7–7.7)
Neutrophils Relative %: 67 %
Platelets: 473 10*3/uL — ABNORMAL HIGH (ref 150–400)
RBC: 3.82 MIL/uL — ABNORMAL LOW (ref 4.22–5.81)
RDW: 21.4 % — ABNORMAL HIGH (ref 11.5–15.5)
WBC: 9.1 10*3/uL (ref 4.0–10.5)
nRBC: 1.1 % — ABNORMAL HIGH (ref 0.0–0.2)

## 2019-10-17 MED ORDER — DEXTROSE-NACL 5-0.45 % IV SOLN: INTRAVENOUS | Status: DC

## 2019-10-17 MED ORDER — HYDROMORPHONE 1 MG/ML IV SOLN
INTRAVENOUS | Status: DC
  Administered 2019-10-17: 30 mg via INTRAVENOUS
  Administered 2019-10-17: 12.5 mg via INTRAVENOUS
  Filled 2019-10-17: qty 30

## 2019-10-17 MED ORDER — SODIUM CHLORIDE 0.9% FLUSH: 9.0000 mL | INTRAVENOUS | Status: DC | PRN

## 2019-10-17 MED ORDER — NALOXONE HCL 0.4 MG/ML IJ SOLN: 0.4000 mg | INTRAMUSCULAR | Status: DC | PRN

## 2019-10-17 MED ORDER — DIPHENHYDRAMINE HCL 25 MG PO CAPS: 25.0000 mg | ORAL_CAPSULE | ORAL | Status: DC | PRN

## 2019-10-17 MED ORDER — ONDANSETRON HCL 4 MG/2ML IJ SOLN: 4.0000 mg | Freq: Four times a day (QID) | INTRAMUSCULAR | Status: DC | PRN

## 2019-10-17 MED ORDER — ACETAMINOPHEN 500 MG PO TABS
1000.0000 mg | ORAL_TABLET | Freq: Once | ORAL | Status: AC
  Administered 2019-10-17: 1000 mg via ORAL
  Filled 2019-10-17: qty 2

## 2019-10-17 NOTE — Progress Notes (Signed)
Patient admitted to the day infusion hospital for sickle cell pain. Initially, patient reported bilateral arm and back pain rated 9/10. For pain management, patient placed on Dilaudid PCA, given 1000 mg Tylenol and hydrated with IV fluids. At discharge, patient rated pain at 5/10. Vital signs stable. Discharge instructions given. Patient alert, oriented and ambulatory at discharge.

## 2019-10-17 NOTE — Telephone Encounter (Signed)
Patient called, requesting to come to the day hospital due to pain in the back and arms rated at 9/10. Denied chest pain, fever, diarrhea, abdominal pain, nausea/vomitting and priapism. Screened negative for Covid-19 symptoms. Admitted to having means of transportation without driving self after treatment. Last took one tablet of percocet10-325  mg at 05:00 am today. Per provider, patient can come to the day hospital for treatment. Patient notified, verbalized understanding.

## 2019-10-17 NOTE — H&P (Signed)
[facility] History and Physical   Date: 10/17/2019  Patient name: Timothy Marks Medical record number: [id number] Date of birth: [birth year] Age: 29 y.o. Gender: male PCP: Vevelyn Francois, NP  Attending physician: Tresa Garter, MD  Chief Complaint: Sickle cell pain  History of Present Illness: [place] is a 29 year old male with a medical history significant for sickle cell disease, chronic pain syndrome, opiate dependence and tolerance, history of anemia of chronic disease, and history of oppositional defiance disorder presents with complaints of low back pain that is consistent with his typical pain crisis.  Patient reports that pain increased 3 days ago and has been unrelieved by home medications.  Patient has not identified any aggravating or alleviating factors.  He last had Percocet around 5:00 this a.m. without sustained relief.  Current pain intensity is 9/10 characterized as intermittent and aching.  He denies any sick contacts, recent travel, or COVID-19 exposure.  He also denies fever, chills, chest pain, shortness of breath, urinary symptoms, nausea, vomiting, or diarrhea.  Meds: Medications Prior to Admission  Medication Sig Dispense Refill Last Dose  . acetaminophen (TYLENOL) 500 MG tablet Take 500-1,000 mg by mouth every 6 (six) hours as needed for mild pain or headache.     . cholecalciferol (VITAMIN D) 1000 units tablet Take 2 tablets (2,000 Units total) by mouth daily. 30 tablet 6   . doxycycline (VIBRA-TABS) 100 MG tablet Take 100 mg by mouth 2 (two) times daily.     . folic acid (FOLVITE) 1 MG tablet Take 1 tablet (1 mg total) by mouth daily. 90 tablet 2   . gabapentin (NEURONTIN) 300 MG capsule Take 1 capsule (300 mg total) by mouth 3 (three) times daily. 90 capsule 5   . hydroxyurea (HYDREA) 500 MG capsule Take 1 capsule (500 mg total) by mouth 2 (two) times daily. May take with food to minimize GI side effects. (Patient taking differently:  Take 500 mg by mouth 2 (two) times daily. ) 180 capsule 2   . Nutritional Supplements (ENSURE ACTIVE HIGH PROTEIN) LIQD Take 1 each by mouth 3 (three) times daily between meals. #90 cans of Ensure (Patient taking differently: Take 1 each by mouth 3 (three) times daily between meals. ) 237 mL 11   . oxyCODONE-acetaminophen (PERCOCET) 10-325 MG tablet Take 1 tablet by mouth every 6 (six) hours as needed for up to 15 days for pain. 60 tablet 0   . [START ON 10/18/2019] OXYCONTIN 20 MG 12 hr tablet Take 1 tablet (20 mg total) by mouth every 12 (twelve) hours. (Patient not taking: Reported on 10/13/2019) 60 tablet 0   . RIVAROXABAN (XARELTO) VTE STARTER PACK (15 & 20 MG TABLETS) Follow package directions: Take one 15mg  tablet by mouth twice a day. On day 22, switch to one 20mg  tablet once a day. Take with food. 51 each 0     Allergies: Buprenorphine hcl, Levaquin [levofloxacin], Meperidine, Morphine, Morphine and related, Toradol [ketorolac tromethamine], Tramadol, Vancomycin, and Zosyn [piperacillin sod-tazobactam so] Past Medical History:  Diagnosis Date  . Depression   . Generalized weakness 03/31/2019  . 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 Sickle cell anemia (HCC)   . Vitamin D deficiency y-6   Past Surgical History:  Procedure Laterality Date  . CHOLECYSTECTOMY     Family History  Problem Relation Age of Onset  . Sickle cell trait Mother   . Sickle cell trait  Father    Social History   Socioeconomic History  . Marital status: Single    Spouse name: Not on file  . Number of children: Not on file  . Years of education: Not on file  . Highest education level: Not on file  Occupational History  . Not on file  Tobacco Use  . Smoking status: Never Smoker  . Smokeless tobacco: Never Used  Vaping Use  . Vaping Use: Never used  Substance and Sexual Activity  . Alcohol use: No  . Drug use: Not Currently    Types: Marijuana   . Sexual activity: Not on file  Other Topics Concern  . Not on file  Social History Narrative  . Not on file   Social Determinants of Health   Financial Resource Strain:   . Difficulty of Paying Living Expenses: Not on file  Food Insecurity:   . Worried About Charity fundraiser in the Last Year: Not on file  . Ran Out of Food in the Last Year: Not on file  Transportation Needs:   . Lack of Transportation (Medical): Not on file  . Lack of Transportation (Non-Medical): Not on file  Physical Activity:   . Days of Exercise per Week: Not on file  . Minutes of Exercise per Session: Not on file  Stress:   . Feeling of Stress : Not on file  Social Connections:   . Frequency of Communication with Friends and Family: Not on file  . Frequency of Social Gatherings with Friends and Family: Not on file  . Attends Religious Services: Not on file  . Active Member of Clubs or Organizations: Not on file  . Attends Archivist Meetings: Not on file  . Marital Status: Not on file  Intimate Partner Violence:   . Fear of Current or Ex-Partner: Not on file  . Emotionally Abused: Not on file  . Physically Abused: Not on file  . Sexually Abused: Not on file   Review of Systems  Constitutional: Negative.   HENT: Negative.   Eyes: Negative.   Respiratory: Negative.   Cardiovascular: Negative.   Genitourinary: Negative.   Musculoskeletal: Positive for back pain and joint pain.  Skin: Negative.   Neurological: Negative.   Psychiatric/Behavioral: Negative.      Physical Exam: There were no vitals taken for this visit. Physical Exam Constitutional:      Appearance: Normal appearance.  HENT:     Mouth/Throat:     Mouth: Mucous membranes are moist.  Eyes:     Conjunctiva/sclera: Conjunctivae normal.     Pupils: Pupils are equal, round, and reactive to light.  Cardiovascular:     Rate and Rhythm: Normal rate and regular rhythm.     Pulses: Normal pulses.  Pulmonary:      Effort: Pulmonary effort is normal.  Abdominal:     General: Abdomen is flat. Bowel sounds are normal.  Neurological:     General: No focal deficit present.     Mental Status: He is alert. Mental status is at baseline.  Psychiatric:        Mood and Affect: Mood normal.        Behavior: Behavior normal.        Thought Content: Thought content normal.        Judgment: Judgment normal.      Lab results: No results found for this or any previous visit (from the past 24 hour(s)).  Imaging results:  No results found.  Assessment & Plan: Patient admitted to sickle cell day infusion center for management of pain crisis.  Patient is opiate tolerant Initiate IV dilaudid PCA. Settings of  IV fluids, d5.45% saline at 100 ml/hr Tylenol 1000 mg by mouth times one dose Review CBC with differential, complete metabolic panel, and reticulocytes as results become available. Baseline hemoglobin is Pain intensity will be reevaluated in context of functioning and relationship to baseline as care progress If pain intensity remains elevated and/or sudden change in hemodynamic stability transition to inpatient services for higher level of care.      [name]  APRN, MSN, FNP-C Patient [facility] [address] [place], [place] [address] [phone]  10/17/2019, 9:43 AM

## 2019-10-17 NOTE — Discharge Instructions (Signed)
Sickle Cell Anemia, Adult  Sickle cell anemia is a condition where your red blood cells are shaped like sickles. Red blood cells carry oxygen through the body. Sickle-shaped cells do not live as long as normal red blood cells. They also clump together and block blood from flowing through the blood vessels. This prevents the body from getting enough oxygen. Sickle cell anemia causes organ damage and pain. It also increases the risk of infection. Follow these instructions at home: Medicines  Take over-the-counter and prescription medicines only as told by your doctor.  If you were prescribed an antibiotic medicine, take it as told by your doctor. Do not stop taking the antibiotic even if you start to feel better.  If you develop a fever, do not take medicines to lower the fever right away. Tell your doctor about the fever. Managing pain, stiffness, and swelling  Try these methods to help with pain: ? Use a heating pad. ? Take a warm bath. ? Distract yourself, such as by watching TV. Eating and drinking  Drink enough fluid to keep your pee (urine) clear or pale yellow. Drink more in hot weather and during exercise.  Limit or avoid alcohol.  Eat a healthy diet. Eat plenty of fruits, vegetables, whole grains, and lean protein.  Take vitamins and supplements as told by your doctor. Traveling  When traveling, keep these with you: ? Your medical information. ? The names of your doctors. ? Your medicines.  If you need to take an airplane, talk to your doctor first. Activity  Rest often.  Avoid exercises that make your heart beat much faster, such as jogging. General instructions  Do not use products that have nicotine or tobacco, such as cigarettes and e-cigarettes. If you need help quitting, ask your doctor.  Consider wearing a medical alert bracelet.  Avoid being in high places (high altitudes), such as mountains.  Avoid very hot or cold temperatures.  Avoid places where the  temperature changes a lot.  Keep all follow-up visits as told by your doctor. This is important. Contact a doctor if:  A joint hurts.  Your feet or hands hurt or swell.  You feel tired (fatigued). Get help right away if:  You have symptoms of infection. These include: ? Fever. ? Chills. ? Being very tired. ? Irritability. ? Poor eating. ? Throwing up (vomiting).  You feel dizzy or faint.  You have new stomach pain, especially on the left side.  You have a an erection (priapism) that lasts more than 4 hours.  You have numbness in your arms or legs.  You have a hard time moving your arms or legs.  You have trouble talking.  You have pain that does not go away when you take medicine.  You are short of breath.  You are breathing fast.  You have a long-term cough.  You have pain in your chest.  You have a bad headache.  You have a stiff neck.  Your stomach looks bloated even though you did not eat much.  Your skin is pale.  You suddenly cannot see well. Summary  Sickle cell anemia is a condition where your red blood cells are shaped like sickles.  Follow your doctor's advice on ways to manage pain, food to eat, activities to do, and steps to take for safe travel.  Get medical help right away if you have any signs of infection, such as a fever. This information is not intended to replace advice given to you by   your health care provider. Make sure you discuss any questions you have with your health care provider. Document Revised: 05/25/2018 Document Reviewed: 03/08/2016 Elsevier Patient Education  2020 Elsevier Inc.  

## 2019-10-17 NOTE — Discharge Summary (Signed)
[facility] Discharge Summary   Patient ID: Timothy Marks MRN: [id number] DOB/AGE: [birth year] 29 y.o.  Admit date: 10/17/2019 Discharge date: 10/17/2019  Primary Care Physician:  Vevelyn Francois, NP  Admission Diagnoses:  Active Problems:   Sickle cell pain crisis [facility])   Discharge Medications:  Allergies as of 10/17/2019      Reactions   Buprenorphine Hcl Hives   Levaquin [levofloxacin] Itching   Meperidine Rash   Morphine Hives   Morphine And Related Hives   Toradol [ketorolac Tromethamine] Itching   Tramadol Hives   Vancomycin Itching   Zosyn [piperacillin Sod-tazobactam So] Itching, Rash, Other (See Comments)   Has taken rocephin in the past      Medication List    TAKE these medications   acetaminophen 500 MG tablet Commonly known as: TYLENOL Take 500-1,000 mg by mouth every 6 (six) hours as needed for mild pain or headache.   cholecalciferol 25 MCG (1000 UNIT) tablet Commonly known as: VITAMIN D Take 2 tablets (2,000 Units total) by mouth daily.   doxycycline 100 MG tablet Commonly known as: VIBRA-TABS Take 100 mg by mouth 2 (two) times daily.   Ensure Active High Protein Liqd Take 1 each by mouth 3 (three) times daily between meals. #90 cans of Ensure What changed: additional instructions   folic acid 1 MG tablet Commonly known as: FOLVITE Take 1 tablet (1 mg total) by mouth daily.   gabapentin 300 MG capsule Commonly known as: NEURONTIN Take 1 capsule (300 mg total) by mouth 3 (three) times daily.   hydroxyurea 500 MG capsule Commonly known as: HYDREA Take 1 capsule (500 mg total) by mouth 2 (two) times daily. May take with food to minimize GI side effects. What changed: additional instructions   oxyCODONE-acetaminophen 10-325 MG tablet Commonly known as: Percocet Take 1 tablet by mouth every 6 (six) hours as needed for up to 15 days for pain.   OxyCONTIN 20 mg 12 hr tablet Generic drug: oxyCODONE Take 1 tablet (20 mg total) by  mouth every 12 (twelve) hours. Start taking on: October 18, 2019   Rivaroxaban Stater Pack (15 mg and 20 mg) Commonly known as: [place] PACK Follow package directions: Take one 15mg  tablet by mouth twice a day. On day 22, switch to one 20mg  tablet once a day. Take with food.        Consults:  None  Significant Diagnostic Studies:  VAS [place] LOWER EXTREMITY VENOUS (DVT) (ONLY [facility] & [facility])  Result Date: 09/21/2019  Lower Venous DVTStudy Indications: Pain in left leg.  Comparison Study: No prior studies. Performing Technologist: Darlin Coco  Examination Guidelines: A complete evaluation includes B-mode imaging, spectral Doppler, color Doppler, and power Doppler as needed of all accessible portions of each vessel. Bilateral testing is considered an integral part of a complete examination. Limited examinations for reoccurring indications may be performed as noted. The reflux portion of the exam is performed with the patient in reverse Trendelenburg.  +-----+---------------+---------+-----------+----------+--------------+ RIGHTCompressibilityPhasicitySpontaneityPropertiesThrombus Aging +-----+---------------+---------+-----------+----------+--------------+ CFV  Full           Yes      Yes                                 +-----+---------------+---------+-----------+----------+--------------+   +---------+---------------+---------+-----------+----------+--------------+ LEFT     CompressibilityPhasicitySpontaneityPropertiesThrombus Aging +---------+---------------+---------+-----------+----------+--------------+ CFV      Full           Yes  Yes                                 +---------+---------------+---------+-----------+----------+--------------+ SFJ      Full                                                        +---------+---------------+---------+-----------+----------+--------------+ FV Prox  Full                                                         +---------+---------------+---------+-----------+----------+--------------+ FV Mid   Full                                                        +---------+---------------+---------+-----------+----------+--------------+ FV DistalFull                                                        +---------+---------------+---------+-----------+----------+--------------+ PFV      Full                                                        +---------+---------------+---------+-----------+----------+--------------+ POP      None           No       No                   Acute          +---------+---------------+---------+-----------+----------+--------------+ PTV      Full                                                        +---------+---------------+---------+-----------+----------+--------------+ PERO     None           No       No                   Acute          +---------+---------------+---------+-----------+----------+--------------+     Summary: RIGHT: - No evidence of common femoral vein obstruction.  LEFT: - Findings consistent with acute deep vein thrombosis involving the left popliteal vein, and left peroneal veins. - No cystic structure found in the popliteal fossa.  *See table(s) above for measurements and observations. Electronically signed by Harold Barban MD on 09/21/2019 at 1:17:22 AM.    Final    History of present illness: [name] is a 29 year old male with a medical history significant for sickle cell disease, chronic  pain syndrome, opiate dependence and tolerance, history of anemia of chronic disease, and history of oppositional defiance disorder presents with complaints of low back pain that is consistent with his typical pain crisis.  Patient reports that pain increased 3 days ago and has been unrelieved by home medications.  Patient has not identified any aggravating or alleviating factors.  He last had Percocet around 5:00 this a.m. without sustained  relief.  Current pain intensity is 9/10 characterized as intermittent and aching.  He denies any sick contacts, recent travel, or COVID-19 exposure.  He also denies fever, chills, chest pain, shortness of breath, urinary symptoms, nausea, vomiting, or diarrhea.    [facility]: Patient admitted to sickle cell day clinic for management of pain crisis.  Reviewed all laboratory values, appear to be consistent with patient's baseline.  Pain managed with IV Dilaudid via PCA with settings of 0.5 mg, 10-minute lockout, and 3 mg/h, Tylenol 1000 mg x 1, and IV fluids D5 0.45% saline at 125 mL/h. Patient's pain intensity decreased to 6/10.  He does not warrant admission on today.  He is alert, oriented, and ambulating without assistance.  He will discharge home with family in a hemodynamically stable condition.  Discharge instructions: Resume all home medications.   Follow up with PCP as previously  scheduled.   Discussed the importance of drinking 64 ounces of water daily, dehydration of red blood cells may lead further sickling.   Avoid all stressors that precipitate sickle cell pain crisis.     The patient was given clear instructions to go to ER or return to medical center if symptoms do not improve, worsen or new problems develop.    Physical Exam at Discharge:  BP 113/67 (BP Location: Left Arm)   Pulse 75   Temp 98.6 F (37 C) (Oral)   Resp 11   SpO2 100%   Physical Exam Constitutional:      Appearance: Normal appearance.  Eyes:     Pupils: Pupils are equal, round, and reactive to light.  Cardiovascular:     Rate and Rhythm: Normal rate and regular rhythm.     Pulses: Normal pulses.  Pulmonary:     Effort: Pulmonary effort is normal.  Abdominal:     General: Abdomen is flat. Bowel sounds are normal.  Musculoskeletal:        General: Normal range of motion.  Skin:    General: Skin is warm.  Neurological:     General: No focal deficit present.     Mental  Status: He is alert. Mental status is at baseline.  Psychiatric:        Mood and Affect: Mood normal.        Thought Content: Thought content normal.        Judgment: Judgment normal.     Disposition at Discharge: Discharge disposition: 01-Home or Self Care       Discharge Orders: Discharge Instructions    Discharge patient   Complete by: As directed    Discharge disposition: 01-Home or Self Care   Discharge patient date: 10/17/2019      Condition at Discharge:   Stable  Time spent on Discharge:  Greater than 30 minutes.  Signed: [name]  APRN, MSN, FNP-C [facility] [address] [place], [place] [address] [phone]  10/17/2019, 3:43 PM

## 2019-10-18 MED FILL — OxyCONTIN 20 MG T12A: 20 | 30 days supply | Qty: 60 | Fill #0

## 2019-10-23 ENCOUNTER — Non-Acute Institutional Stay (HOSPITAL_COMMUNITY)
Admission: AD | Admit: 2019-10-23 | Discharge: 2019-10-23 | Disposition: A | Discharge status: Home / Self Care | Payer: Medicaid Other | Source: Ambulatory Visit | Attending: Internal Medicine | Admitting: Internal Medicine

## 2019-10-23 ENCOUNTER — Encounter (HOSPITAL_COMMUNITY): Payer: Self-pay | Admitting: Family Medicine

## 2019-10-23 ENCOUNTER — Other Ambulatory Visit: Payer: Self-pay | Admitting: Family Medicine

## 2019-10-23 ENCOUNTER — Telehealth (HOSPITAL_COMMUNITY): Payer: Self-pay

## 2019-10-23 DIAGNOSIS — D638 Anemia in other chronic diseases classified elsewhere: Secondary | ICD-10-CM | POA: Diagnosis not present

## 2019-10-23 DIAGNOSIS — Z880 Allergy status to penicillin: Secondary | ICD-10-CM | POA: Diagnosis not present

## 2019-10-23 DIAGNOSIS — D5700 Hb-SS disease with crisis, unspecified: Secondary | ICD-10-CM | POA: Diagnosis present

## 2019-10-23 DIAGNOSIS — Z79899 Other long term (current) drug therapy: Secondary | ICD-10-CM | POA: Diagnosis not present

## 2019-10-23 DIAGNOSIS — Z888 Allergy status to other drugs, medicaments and biological substances status: Secondary | ICD-10-CM | POA: Insufficient documentation

## 2019-10-23 DIAGNOSIS — F1120 Opioid dependence, uncomplicated: Secondary | ICD-10-CM | POA: Diagnosis not present

## 2019-10-23 DIAGNOSIS — Z7901 Long term (current) use of anticoagulants: Secondary | ICD-10-CM | POA: Diagnosis not present

## 2019-10-23 DIAGNOSIS — Z885 Allergy status to narcotic agent status: Secondary | ICD-10-CM | POA: Insufficient documentation

## 2019-10-23 DIAGNOSIS — E559 Vitamin D deficiency, unspecified: Secondary | ICD-10-CM | POA: Diagnosis not present

## 2019-10-23 DIAGNOSIS — Z881 Allergy status to other antibiotic agents status: Secondary | ICD-10-CM | POA: Diagnosis not present

## 2019-10-23 DIAGNOSIS — G894 Chronic pain syndrome: Secondary | ICD-10-CM | POA: Insufficient documentation

## 2019-10-23 LAB — RETICULOCYTES
Immature Retic Fract: 39.1 % — ABNORMAL HIGH (ref 2.3–15.9)
RBC.: 3.93 MIL/uL — ABNORMAL LOW (ref 4.22–5.81)
Retic Count, Absolute: 238.9 10*3/uL — ABNORMAL HIGH (ref 19.0–186.0)
Retic Ct Pct: 6.1 % — ABNORMAL HIGH (ref 0.4–3.1)

## 2019-10-23 LAB — COMPREHENSIVE METABOLIC PANEL
ALT: 19 U/L (ref 0–44)
AST: 29 U/L (ref 15–41)
Albumin: 4.2 g/dL (ref 3.5–5.0)
Alkaline Phosphatase: 74 U/L (ref 38–126)
Anion gap: 10 (ref 5–15)
BUN: 10 mg/dL (ref 6–20)
CO2: 25 mmol/L (ref 22–32)
Calcium: 9.1 mg/dL (ref 8.9–10.3)
Chloride: 99 mmol/L (ref 98–111)
Creatinine, Ser: 0.51 mg/dL — ABNORMAL LOW (ref 0.61–1.24)
GFR calc Af Amer: >60 mL/min (ref >60)
GFR calc non Af Amer: >60 mL/min (ref >60)
Glucose, Bld: 125 mg/dL — ABNORMAL HIGH (ref 70–99)
Potassium: 3.9 mmol/L (ref 3.5–5.1)
Sodium: 134 mmol/L — ABNORMAL LOW (ref 135–145)
Total Bilirubin: 2.4 mg/dL — ABNORMAL HIGH (ref 0.3–1.2)
Total Protein: 8 g/dL (ref 6.5–8.1)

## 2019-10-23 LAB — CBC WITH DIFFERENTIAL/PLATELET
Abs Immature Granulocytes: 0.03 10*3/uL (ref 0.00–0.07)
Basophils Absolute: 0 10*3/uL (ref 0.0–0.1)
Basophils Relative: 0 %
Eosinophils Absolute: 0.2 10*3/uL (ref 0.0–0.5)
Eosinophils Relative: 2 %
HCT: 32.7 % — ABNORMAL LOW (ref 39.0–52.0)
Hemoglobin: 11.1 g/dL — ABNORMAL LOW (ref 13.0–17.0)
Immature Granulocytes: 0 %
Lymphocytes Relative: 12 %
Lymphs Abs: 1.4 10*3/uL (ref 0.7–4.0)
MCH: 28.4 pg (ref 26.0–34.0)
MCHC: 33.9 g/dL (ref 30.0–36.0)
MCV: 83.6 fL (ref 80.0–100.0)
Monocytes Absolute: 0.8 10*3/uL (ref 0.1–1.0)
Monocytes Relative: 7 %
Neutro Abs: 8.8 10*3/uL — ABNORMAL HIGH (ref 1.7–7.7)
Neutrophils Relative %: 79 %
Platelets: 415 10*3/uL — ABNORMAL HIGH (ref 150–400)
RBC: 3.91 MIL/uL — ABNORMAL LOW (ref 4.22–5.81)
RDW: 22.5 % — ABNORMAL HIGH (ref 11.5–15.5)
WBC: 11.2 10*3/uL — ABNORMAL HIGH (ref 4.0–10.5)
nRBC: 0.8 % — ABNORMAL HIGH (ref 0.0–0.2)

## 2019-10-23 MED ORDER — SODIUM CHLORIDE 0.9% FLUSH: 9.0000 mL | INTRAVENOUS | Status: DC | PRN

## 2019-10-23 MED ORDER — ONDANSETRON HCL 4 MG/2ML IJ SOLN
4.0000 mg | Freq: Four times a day (QID) | INTRAMUSCULAR | Status: DC | PRN
  Administered 2019-10-23: 4 mg via INTRAVENOUS
  Filled 2019-10-23: qty 2

## 2019-10-23 MED ORDER — DIPHENHYDRAMINE HCL 25 MG PO CAPS
25.0000 mg | ORAL_CAPSULE | Freq: Once | ORAL | Status: AC
  Administered 2019-10-23: 25 mg via ORAL
  Filled 2019-10-23: qty 1

## 2019-10-23 MED ORDER — KETOROLAC TROMETHAMINE 30 MG/ML IJ SOLN
15.0000 mg | Freq: Once | INTRAMUSCULAR | Status: AC
  Administered 2019-10-23: 15 mg via INTRAVENOUS
  Filled 2019-10-23: qty 1

## 2019-10-23 MED ORDER — ACETAMINOPHEN 500 MG PO TABS
1000.0000 mg | ORAL_TABLET | Freq: Once | ORAL | Status: AC
  Administered 2019-10-23: 1000 mg via ORAL
  Filled 2019-10-23: qty 2

## 2019-10-23 MED ORDER — HYDROMORPHONE 1 MG/ML IV SOLN
INTRAVENOUS | Status: DC
  Administered 2019-10-23: 30 mg via INTRAVENOUS
  Administered 2019-10-23: 7 mg via INTRAVENOUS
  Filled 2019-10-23: qty 30

## 2019-10-23 MED ORDER — DEXTROSE-NACL 5-0.45 % IV SOLN: INTRAVENOUS | Status: DC

## 2019-10-23 MED ORDER — NALOXONE HCL 0.4 MG/ML IJ SOLN: 0.4000 mg | INTRAMUSCULAR | Status: DC | PRN

## 2019-10-23 NOTE — Discharge Summary (Addendum)
[facility] Discharge Summary   Patient ID: Timothy Marks MRN: [id number] DOB/AGE: May 11, 1990 29 y.o.  Admit date: 10/23/2019 Discharge date: 10/23/2019  Primary Care Physician:  Vevelyn Francois, NP  Admission Diagnoses:  Active Problems:   Sickle cell pain crisis [name])   Discharge Medications:  Allergies as of 10/23/2019      Reactions   Buprenorphine Hcl Hives   Levaquin [levofloxacin] Itching   Meperidine Rash   Morphine Hives   Morphine And Related Hives   Toradol [ketorolac Tromethamine] Itching   Tramadol Hives   Vancomycin Itching   Zosyn [piperacillin Sod-tazobactam So] Itching, Rash, Other (See Comments)   Has taken rocephin in the past      Medication List    TAKE these medications   acetaminophen 500 MG tablet Commonly known as: TYLENOL Take 500-1,000 mg by mouth every 6 (six) hours as needed for mild pain or headache.   cholecalciferol 25 MCG (1000 UNIT) tablet Commonly known as: VITAMIN D Take 2 tablets (2,000 Units total) by mouth daily.   doxycycline 100 MG tablet Commonly known as: VIBRA-TABS Take 100 mg by mouth 2 (two) times daily.   Ensure Active High Protein Liqd Take 1 each by mouth 3 (three) times daily between meals. #90 cans of Ensure What changed: additional instructions   folic acid 1 MG tablet Commonly known as: FOLVITE Take 1 tablet (1 mg total) by mouth daily.   gabapentin 300 MG capsule Commonly known as: NEURONTIN Take 1 capsule (300 mg total) by mouth 3 (three) times daily.   hydroxyurea 500 MG capsule Commonly known as: HYDREA Take 1 capsule (500 mg total) by mouth 2 (two) times daily. May take with food to minimize GI side effects. What changed: additional instructions   oxyCODONE-acetaminophen 10-325 MG tablet Commonly known as: Percocet Take 1 tablet by mouth every 6 (six) hours as needed for up to 15 days for pain.   OxyCONTIN 20 mg 12 hr tablet Generic drug: oxyCODONE Take 1 tablet (20 mg total)  by mouth every 12 (twelve) hours.   Rivaroxaban Stater Pack (15 mg and 20 mg) Commonly known as: XARELTO STARTER PACK Follow package directions: Take one 15mg  tablet by mouth twice a day. On day 22, switch to one 20mg  tablet once a day. Take with food.        Consults:  None  Significant Diagnostic Studies:  No results found.  History of present illness:  Timothy Marks is a 29 year old male with a medical history significant for sickle cell disease, chronic pain syndrome, opiate dependence and tolerance, history of anemia of chronic disease, and history of oppositional defiance disorder presents complaining of low back and upper extremity pain that is consistent with his typical pain crisis.  Patient says that he awakened with intense pain this a.m.  He attributes pain crisis to changes in weather.  He last had Percocet and OxyContin this a.m. without sustained relief.  Current pain intensity is 9/10 characterized as constant and throbbing.  Patient denies any headache, chest pain, urinary symptoms, fever, chills, nausea, vomiting, or diarrhea.  No sick contacts, recent travel, or exposure to COVID-19. [facility] Course: Patient admitted to sickle cell day infusion clinic for management of pain crisis. Reviewed all laboratory values, consistent with patient's baseline. Pain managed with IV Dilaudid via PCA with settings of 0.5 mg, 10-minute lockout, and 3 mg/h. Toradol 15 mg IV x1 Tylenol 1000 mg by mouth x1 IV fluids, 0.45% saline  at 100 mL/h Pain intensity decreased to 6/10.  Patient does not warrant admission.  Advised to resume all home medications.  Doctor is alert, oriented, and ambulating without assistance.  He will discharge home in a hemodynamically stable condition.  Discharge instructions: Resume all home medications.   Follow up with PCP as previously  scheduled.   Discussed the importance of drinking 64 ounces of water daily, dehydration of red blood  cells may lead further sickling.   Avoid all stressors that precipitate sickle cell pain crisis.     The patient was given clear instructions to go to ER or return to medical center if symptoms do not improve, worsen or new problems develop.     Physical Exam at Discharge:  BP 133/67 (BP Location: Right Arm)   Pulse 82   Temp 98.1 F (36.7 C) (Temporal)   Resp 11   SpO2 98%   Physical Exam Constitutional:      Appearance: Normal appearance.  Eyes:     Pupils: Pupils are equal, round, and reactive to light.  Cardiovascular:     Rate and Rhythm: Normal rate and regular rhythm.     Pulses: Normal pulses.  Pulmonary:     Effort: Pulmonary effort is normal.  Abdominal:     General: Abdomen is flat. Bowel sounds are normal.  Musculoskeletal:        General: Normal range of motion.  Skin:    General: Skin is warm.  Neurological:     General: No focal deficit present.     Mental Status: He is alert. Mental status is at baseline.  Psychiatric:        Mood and Affect: Mood normal.        Behavior: Behavior normal.        Thought Content: Thought content normal.        Judgment: Judgment normal.     Disposition at Discharge: Discharge disposition: 01-Home or Self Care       Discharge Orders: Discharge Instructions    Discharge patient   Complete by: As directed    Discharge disposition: 01-Home or Self Care   Discharge patient date: 10/23/2019      Condition at Discharge:   Stable  Time spent on Discharge:  Greater than 30 minutes.  Signed: [name]  APRN, MSN, FNP-C [facility] [address] [place], [place] [address] [phone]  10/23/2019, 3:42 PM

## 2019-10-23 NOTE — Telephone Encounter (Signed)
Pt called requesting a visit today.  He is in sickle cell crisis.  Denies CP, fever, abdominal pain, or diarrhea.  Covid screen negative.  Has transportation to and from the clinic.  Visit approved by [place], NP.

## 2019-10-23 NOTE — Progress Notes (Signed)
Patient admitted to the day infusion hospital for sickle cell pain crisis. Initially, patient reported bilateral arm and back pain rated 8/10. For pain management, patient placed on Dilaudid PCA, given 15 mg Toradol, 1000 mg Tylenol and hydrated with IV fluids. At discharge, patient rated pain at 6/10. Vital signs stable. Discharge instructions given. Patient alert, oriented and ambulatory at discharge.

## 2019-10-23 NOTE — H&P (Signed)
[facility] History and Physical   Date: 10/23/2019  Patient name: Kinnie Kaupp Medical record number: [id number] Date of birth: 04/14/1990 Age: 29 y.o. Gender: male PCP: Vevelyn Francois, NP  Attending physician: Tresa Garter, MD  Chief Complaint: Sickle cell pain  History of Present Illness: Divon Krabill is a 29 year old male with a medical history significant for sickle cell disease, chronic pain syndrome, opiate dependence and tolerance, history of anemia of chronic disease, and history of oppositional defiance disorder presents complaining of low back and upper extremity pain that is consistent with his typical pain crisis.  Patient says that he awakened with intense pain this a.m.  He attributes pain crisis to changes in weather.  He last had Percocet and OxyContin this a.m. without sustained relief.  Current pain intensity is 9/10 characterized as constant and throbbing.  Patient denies any headache, chest pain, urinary symptoms, fever, chills, nausea, vomiting, or diarrhea.  No sick contacts, recent travel, or exposure to COVID-19.  Meds: Medications Prior to Admission  Medication Sig Dispense Refill Last Dose  . acetaminophen (TYLENOL) 500 MG tablet Take 500-1,000 mg by mouth every 6 (six) hours as needed for mild pain or headache.   10/23/2019 at Unknown time  . cholecalciferol (VITAMIN D) 1000 units tablet Take 2 tablets (2,000 Units total) by mouth daily. 30 tablet 6 10/23/2019 at Unknown time  . folic acid (FOLVITE) 1 MG tablet Take 1 tablet (1 mg total) by mouth daily. 90 tablet 2 10/23/2019 at Unknown time  . gabapentin (NEURONTIN) 300 MG capsule Take 1 capsule (300 mg total) by mouth 3 (three) times daily. 90 capsule 5 10/23/2019 at Unknown time  . hydroxyurea (HYDREA) 500 MG capsule Take 1 capsule (500 mg total) by mouth 2 (two) times daily. May take with food to minimize GI side effects. (Patient taking differently: Take 500 mg by mouth 2 (two) times daily.  ) 180 capsule 2 10/23/2019 at Unknown time  . RIVAROXABAN (XARELTO) VTE STARTER PACK (15 & 20 MG TABLETS) Follow package directions: Take one 15mg  tablet by mouth twice a day. On day 22, switch to one 20mg  tablet once a day. Take with food. 51 each 0 10/23/2019 at Unknown time  . doxycycline (VIBRA-TABS) 100 MG tablet Take 100 mg by mouth 2 (two) times daily.     . Nutritional Supplements (ENSURE ACTIVE HIGH PROTEIN) LIQD Take 1 each by mouth 3 (three) times daily between meals. #90 cans of Ensure (Patient taking differently: Take 1 each by mouth 3 (three) times daily between meals. ) 237 mL 11   . oxyCODONE-acetaminophen (PERCOCET) 10-325 MG tablet Take 1 tablet by mouth every 6 (six) hours as needed for up to 15 days for pain. 60 tablet 0   . OXYCONTIN 20 MG 12 hr tablet Take 1 tablet (20 mg total) by mouth every 12 (twelve) hours. (Patient not taking: Reported on 10/13/2019) 60 tablet 0     Allergies: Buprenorphine hcl, Levaquin [levofloxacin], Meperidine, Morphine, Morphine and related, Toradol [ketorolac tromethamine], Tramadol, Vancomycin, and Zosyn [piperacillin sod-tazobactam so] Past Medical History:  Diagnosis Date  . Depression   . Generalized weakness 03/31/2019  . 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 Sickle cell anemia (HCC)   . Vitamin D deficiency y-6   Past Surgical History:  Procedure Laterality Date  . CHOLECYSTECTOMY     Family History  Problem Relation Age of Onset  .  Sickle cell trait Mother   . Sickle cell trait Father    Social History   Socioeconomic History  . Marital status: Single    Spouse name: Not on file  . Number of children: Not on file  . Years of education: Not on file  . Highest education level: Not on file  Occupational History  . Not on file  Tobacco Use  . Smoking status: Never Smoker  . Smokeless tobacco: Never Used  Vaping Use  . Vaping Use: Never used  Substance and Sexual  Activity  . Alcohol use: No  . Drug use: Not Currently    Types: Marijuana  . Sexual activity: Not on file  Other Topics Concern  . Not on file  Social History Narrative  . Not on file   Social Determinants of Health   Financial Resource Strain:   . Difficulty of Paying Living Expenses: Not on file  Food Insecurity:   . Worried About Charity fundraiser in the Last Year: Not on file  . Ran Out of Food in the Last Year: Not on file  Transportation Needs:   . Lack of Transportation (Medical): Not on file  . Lack of Transportation (Non-Medical): Not on file  Physical Activity:   . Days of Exercise per Week: Not on file  . Minutes of Exercise per Session: Not on file  Stress:   . Feeling of Stress : Not on file  Social Connections:   . Frequency of Communication with Friends and Family: Not on file  . Frequency of Social Gatherings with Friends and Family: Not on file  . Attends Religious Services: Not on file  . Active Member of Clubs or Organizations: Not on file  . Attends Archivist Meetings: Not on file  . Marital Status: Not on file  Intimate Partner Violence:   . Fear of Current or Ex-Partner: Not on file  . Emotionally Abused: Not on file  . Physically Abused: Not on file  . Sexually Abused: Not on file   Review of Systems  Constitutional: Negative.  Negative for malaise/fatigue and weight loss.  HENT: Negative.   Eyes: Negative.   Respiratory: Negative.   Cardiovascular: Negative.   Gastrointestinal: Negative.   Genitourinary: Negative.   Musculoskeletal: Positive for back pain and joint pain.  Skin: Negative.   Neurological: Negative.   Psychiatric/Behavioral: Negative.     Physical Exam: Blood pressure 133/67, pulse 82, temperature 98.1 F (36.7 C), temperature source Temporal, resp. rate 11, SpO2 98 %.  BP 133/67 (BP Location: Right Arm)   Pulse 82   Temp 98.1 F (36.7 C) (Temporal)   Resp 11   SpO2 98%   General Appearance:    Alert,  cooperative, no distress, appears stated age  Head:    Normocephalic, without obvious abnormality, atraumatic  Eyes:    PERRL, conjunctiva/corneas clear, EOM's intact, fundi    benign, both eyes       Throat:   Lips, mucosa, and tongue normal; teeth and gums normal  Neck:   Supple, symmetrical, trachea midline, no adenopathy;       thyroid:  No enlargement/tenderness/nodules; no carotid   bruit or JVD  Back:     Symmetric, no curvature, ROM normal, no CVA tenderness  Lungs:     Clear to auscultation bilaterally, respirations unlabored  Chest wall:    No tenderness or deformity  Heart:    Regular rate and rhythm, S1 and S2 normal, no murmur, rub  or gallop  Abdomen:     Soft, non-tender, bowel sounds active all four quadrants,    no masses, no organomegaly  Extremities:   Extremities normal, atraumatic, no cyanosis or edema  Pulses:   2+ and symmetric all extremities  Skin:   Skin color, texture, turgor normal, no rashes or lesions  Lymph nodes:   Cervical, supraclavicular, and axillary nodes normal  Neurologic:   CNII-XII intact. Normal strength, sensation and reflexes      throughout    Lab results: Results for orders placed or performed during the hospital encounter of 10/23/19 (from the past 24 hour(s))  CBC WITH DIFFERENTIAL     Status: Abnormal   Collection Time: 10/23/19  9:24 AM  Result Value Ref Range   WBC 11.2 (H) 4.0 - 10.5 K/uL   RBC 3.91 (L) 4.22 - 5.81 MIL/uL   Hemoglobin 11.1 (L) 13.0 - 17.0 g/dL   HCT 32.7 (L) 39 - 52 %   MCV 83.6 80.0 - 100.0 fL   MCH 28.4 26.0 - 34.0 pg   MCHC 33.9 30.0 - 36.0 g/dL   RDW 22.5 (H) 11.5 - 15.5 %   Platelets 415 (H) 150 - 400 K/uL   nRBC 0.8 (H) 0.0 - 0.2 %   Neutrophils Relative % 79 %   Neutro Abs 8.8 (H) 1.7 - 7.7 K/uL   Lymphocytes Relative 12 %   Lymphs Abs 1.4 0.7 - 4.0 K/uL   Monocytes Relative 7 %   Monocytes Absolute 0.8 0 - 1 K/uL   Eosinophils Relative 2 %   Eosinophils Absolute 0.2 0 - 0 K/uL   Basophils  Relative 0 %   Basophils Absolute 0.0 0 - 0 K/uL   Immature Granulocytes 0 %   Abs Immature Granulocytes 0.03 0.00 - 0.07 K/uL   Polychromasia PRESENT    Sickle Cells PRESENT    Target Cells PRESENT   Reticulocytes     Status: Abnormal   Collection Time: 10/23/19  9:25 AM  Result Value Ref Range   Retic Ct Pct 6.1 (H) 0.4 - 3.1 %   RBC. 3.93 (L) 4.22 - 5.81 MIL/uL   Retic Count, Absolute 238.9 (H) 19.0 - 186.0 K/uL   Immature Retic Fract 39.1 (H) 2.3 - 15.9 %  Comprehensive metabolic panel     Status: Abnormal   Collection Time: 10/23/19 11:29 AM  Result Value Ref Range   Sodium 134 (L) 135 - 145 mmol/L   Potassium 3.9 3.5 - 5.1 mmol/L   Chloride 99 98 - 111 mmol/L   CO2 25 22 - 32 mmol/L   Glucose, Bld 125 (H) 70 - 99 mg/dL   BUN 10 6 - 20 mg/dL   Creatinine, Ser 0.51 (L) 0.61 - 1.24 mg/dL   Calcium 9.1 8.9 - 10.3 mg/dL   Total Protein 8.0 6.5 - 8.1 g/dL   Albumin 4.2 3.5 - 5.0 g/dL   AST 29 15 - 41 U/L   ALT 19 0 - 44 U/L   Alkaline Phosphatase 74 38 - 126 U/L   Total Bilirubin 2.4 (H) 0.3 - 1.2 mg/dL   GFR calc non Af Amer >60 >60 mL/min   GFR calc Af Amer >60 >60 mL/min   Anion gap 10 5 - 15    Imaging results:  No results found.   Assessment & Plan: Patient admitted to sickle cell day infusion center for management of pain crisis.  Patient is opiate tolerant Initiate IV dilaudid PCA. Settings of 0.5, 10  minute lockout, and 3 mg/hr IV fluids, 0.45% saline at 100 ml/hr Toradol 15 mg IV times one dose Tylenol 1000 mg by mouth times one dose Review CBC with differential, complete metabolic panel, and reticulocytes as results become available. Baseline hemoglobin is Pain intensity will be reevaluated in context of functioning and relationship to baseline as care progress If pain intensity remains elevated and/or sudden change in hemodynamic stability transition to inpatient services for higher level of care.       [name]  APRN, MSN, FNP-C Patient  [facility] [address] [place], [place] [address] [phone]  10/23/2019, 2:45 PM

## 2019-10-23 NOTE — Discharge Instructions (Signed)
Sickle Cell Anemia, Adult  Sickle cell anemia is a condition where your red blood cells are shaped like sickles. Red blood cells carry oxygen through the body. Sickle-shaped cells do not live as long as normal red blood cells. They also clump together and block blood from flowing through the blood vessels. This prevents the body from getting enough oxygen. Sickle cell anemia causes organ damage and pain. It also increases the risk of infection. Follow these instructions at home: Medicines  Take over-the-counter and prescription medicines only as told by your doctor.  If you were prescribed an antibiotic medicine, take it as told by your doctor. Do not stop taking the antibiotic even if you start to feel better.  If you develop a fever, do not take medicines to lower the fever right away. Tell your doctor about the fever. Managing pain, stiffness, and swelling  Try these methods to help with pain: ? Use a heating pad. ? Take a warm bath. ? Distract yourself, such as by watching TV. Eating and drinking  Drink enough fluid to keep your pee (urine) clear or pale yellow. Drink more in hot weather and during exercise.  Limit or avoid alcohol.  Eat a healthy diet. Eat plenty of fruits, vegetables, whole grains, and lean protein.  Take vitamins and supplements as told by your doctor. Traveling  When traveling, keep these with you: ? Your medical information. ? The names of your doctors. ? Your medicines.  If you need to take an airplane, talk to your doctor first. Activity  Rest often.  Avoid exercises that make your heart beat much faster, such as jogging. General instructions  Do not use products that have nicotine or tobacco, such as cigarettes and e-cigarettes. If you need help quitting, ask your doctor.  Consider wearing a medical alert bracelet.  Avoid being in high places (high altitudes), such as mountains.  Avoid very hot or cold temperatures.  Avoid places where the  temperature changes a lot.  Keep all follow-up visits as told by your doctor. This is important. Contact a doctor if:  A joint hurts.  Your feet or hands hurt or swell.  You feel tired (fatigued). Get help right away if:  You have symptoms of infection. These include: ? Fever. ? Chills. ? Being very tired. ? Irritability. ? Poor eating. ? Throwing up (vomiting).  You feel dizzy or faint.  You have new stomach pain, especially on the left side.  You have a an erection (priapism) that lasts more than 4 hours.  You have numbness in your arms or legs.  You have a hard time moving your arms or legs.  You have trouble talking.  You have pain that does not go away when you take medicine.  You are short of breath.  You are breathing fast.  You have a long-term cough.  You have pain in your chest.  You have a bad headache.  You have a stiff neck.  Your stomach looks bloated even though you did not eat much.  Your skin is pale.  You suddenly cannot see well. Summary  Sickle cell anemia is a condition where your red blood cells are shaped like sickles.  Follow your doctor's advice on ways to manage pain, food to eat, activities to do, and steps to take for safe travel.  Get medical help right away if you have any signs of infection, such as a fever. This information is not intended to replace advice given to you by   your health care provider. Make sure you discuss any questions you have with your health care provider. Document Revised: 05/25/2018 Document Reviewed: 03/08/2016 Elsevier Patient Education  2020 Elsevier Inc.  

## 2019-10-28 ENCOUNTER — Other Ambulatory Visit: Payer: Self-pay

## 2019-10-28 ENCOUNTER — Ambulatory Visit (INDEPENDENT_AMBULATORY_CARE_PROVIDER_SITE_OTHER): Payer: Medicaid Other | Admitting: Nurse Practitioner

## 2019-10-28 ENCOUNTER — Encounter: Payer: Self-pay | Admitting: Nurse Practitioner

## 2019-10-28 VITALS — BP 125/66 | HR 90 | Temp 97.4°F | Ht 74.0 in | Wt 120.6 lb

## 2019-10-28 DIAGNOSIS — D571 Sickle-cell disease without crisis: Secondary | ICD-10-CM

## 2019-10-28 DIAGNOSIS — R636 Underweight: Secondary | ICD-10-CM

## 2019-10-28 DIAGNOSIS — Z86718 Personal history of other venous thrombosis and embolism: Secondary | ICD-10-CM

## 2019-10-28 DIAGNOSIS — Z79891 Long term (current) use of opiate analgesic: Secondary | ICD-10-CM

## 2019-10-28 DIAGNOSIS — G894 Chronic pain syndrome: Secondary | ICD-10-CM

## 2019-10-28 MED ORDER — OXYCODONE-ACETAMINOPHEN 10-325 MG PO TABS: 1.0000 | ORAL_TABLET | Freq: Four times a day (QID) | ORAL | 0 refills | Status: DC | PRN

## 2019-10-28 MED ORDER — OXYCONTIN 20 MG PO T12A: 20.0000 mg | EXTENDED_RELEASE_TABLET | Freq: Two times a day (BID) | ORAL | 0 refills | Status: DC

## 2019-10-28 MED FILL — OXYCODONE-APAP 10-325: 10-325 | 15 days supply | Qty: 60 | Fill #0

## 2019-10-28 NOTE — Patient Instructions (Signed)
Healthy Eating Following a healthy eating pattern may help you to achieve and maintain a healthy body weight, reduce the risk of chronic disease, and live a long and productive life. It is important to follow a healthy eating pattern at an appropriate calorie level for your body. Your nutritional needs should be met primarily through food by choosing a variety of nutrient-rich foods. What are tips for following this plan? Reading food labels  Read labels and choose the following: ? Reduced or low sodium. ? Juices with 100% fruit juice. ? Foods with low saturated fats and high polyunsaturated and monounsaturated fats. ? Foods with whole grains, such as whole wheat, cracked wheat, brown rice, and wild rice. ? Whole grains that are fortified with folic acid. This is recommended for women who are pregnant or who want to become pregnant.  Read labels and avoid the following: ? Foods with a lot of added sugars. These include foods that contain brown sugar, corn sweetener, corn syrup, dextrose, fructose, glucose, high-fructose corn syrup, honey, invert sugar, lactose, malt syrup, maltose, molasses, raw sugar, sucrose, trehalose, or turbinado sugar.  Do not eat more than the following amounts of added sugar per day:  6 teaspoons (25 g) for women.  9 teaspoons (38 g) for men. ? Foods that contain processed or refined starches and grains. ? Refined grain products, such as white flour, degermed cornmeal, white bread, and white rice. Shopping  Choose nutrient-rich snacks, such as vegetables, whole fruits, and nuts. Avoid high-calorie and high-sugar snacks, such as potato chips, fruit snacks, and candy.  Use oil-based dressings and spreads on foods instead of solid fats such as butter, stick margarine, or cream cheese.  Limit pre-made sauces, mixes, and "instant" products such as flavored rice, instant noodles, and ready-made pasta.  Try more plant-protein sources, such as tofu, tempeh, black beans,  edamame, lentils, nuts, and seeds.  Explore eating plans such as the Mediterranean diet or vegetarian diet. Cooking  Use oil to saut or stir-fry foods instead of solid fats such as butter, stick margarine, or lard.  Try baking, boiling, grilling, or broiling instead of frying.  Remove the fatty part of meats before cooking.  Steam vegetables in water or broth. Meal planning   At meals, imagine dividing your plate into fourths: ? One-half of your plate is fruits and vegetables. ? One-fourth of your plate is whole grains. ? One-fourth of your plate is protein, especially lean meats, poultry, eggs, tofu, beans, or nuts.  Include low-fat dairy as part of your daily diet. Lifestyle  Choose healthy options in all settings, including home, work, school, restaurants, or stores.  Prepare your food safely: ? Wash your hands after handling raw meats. ? Keep food preparation surfaces clean by regularly washing with hot, soapy water. ? Keep raw meats separate from ready-to-eat foods, such as fruits and vegetables. ? Cook seafood, meat, poultry, and eggs to the recommended internal temperature. ? Store foods at safe temperatures. In general:  Keep cold foods at 59F (4.4C) or below.  Keep hot foods at 159F (60C) or above.  Keep your freezer at [facility] (-17.8C) or below.  Foods are no longer safe to eat when they have been between the temperatures of 40-159F (4.4-60C) for more than 2 hours. What foods should I eat? Fruits Aim to eat 2 cup-equivalents of fresh, canned (in natural juice), or frozen fruits each day. Examples of 1 cup-equivalent of fruit include 1 small apple, 8 large strawberries, 1 cup canned fruit,  cup  dried fruit, or 1 cup 100% juice. Vegetables Aim to eat 2-3 cup-equivalents of fresh and frozen vegetables each day, including different varieties and colors. Examples of 1 cup-equivalent of vegetables include 2 medium carrots, 2 cups raw, leafy greens, 1 cup chopped  vegetable (raw or cooked), or 1 medium baked potato. Grains Aim to eat 6 ounce-equivalents of whole grains each day. Examples of 1 ounce-equivalent of grains include 1 slice of bread, 1 cup ready-to-eat cereal, 3 cups popcorn, or  cup cooked rice, pasta, or cereal. Meats and other proteins Aim to eat 5-6 ounce-equivalents of protein each day. Examples of 1 ounce-equivalent of protein include 1 egg, 1/2 cup nuts or seeds, or 1 tablespoon (16 g) peanut butter. A cut of meat or fish that is the size of a deck of cards is about 3-4 ounce-equivalents.  Of the protein you eat each week, try to have at least 8 ounces come from seafood. This includes salmon, trout, herring, and anchovies. Dairy Aim to eat 3 cup-equivalents of fat-free or low-fat dairy each day. Examples of 1 cup-equivalent of dairy include 1 cup (240 mL) milk, 8 ounces (250 g) yogurt, 1 ounces (44 g) natural cheese, or 1 cup (240 mL) fortified soy milk. Fats and oils  Aim for about 5 teaspoons (21 g) per day. Choose monounsaturated fats, such as canola and olive oils, avocados, peanut butter, and most nuts, or polyunsaturated fats, such as sunflower, corn, and soybean oils, walnuts, pine nuts, sesame seeds, sunflower seeds, and flaxseed. Beverages  Aim for six 8-oz glasses of water per day. Limit coffee to three to five 8-oz cups per day.  Limit caffeinated beverages that have added calories, such as soda and energy drinks.  Limit alcohol intake to no more than 1 drink a day for nonpregnant women and 2 drinks a day for men. One drink equals 12 oz of beer (355 mL), 5 oz of wine (148 mL), or 1 oz of hard liquor (44 mL). Seasoning and other foods  Avoid adding excess amounts of salt to your foods. Try flavoring foods with herbs and spices instead of salt.  Avoid adding sugar to foods.  Try using oil-based dressings, sauces, and spreads instead of solid fats. This information is based on general [place] nutrition guidelines. For more  information, visit [employer]. Exact amounts may vary based on your nutrition needs. Summary  A healthy eating plan may help you to maintain a healthy weight, reduce the risk of chronic diseases, and stay active throughout your life.  Plan your meals. Make sure you eat the right portions of a variety of nutrient-rich foods.  Try baking, boiling, grilling, or broiling instead of frying.  Choose healthy options in all settings, including home, work, school, restaurants, or stores. This information is not intended to replace advice given to you by your health care provider. Make sure you discuss any questions you have with your health care provider. Document Revised: 05/15/2017 Document Reviewed: 05/15/2017 Elsevier Patient Education  [place].

## 2019-10-29 ENCOUNTER — Other Ambulatory Visit: Payer: Self-pay | Admitting: Nurse Practitioner

## 2019-10-29 DIAGNOSIS — D571 Sickle-cell disease without crisis: Secondary | ICD-10-CM

## 2019-10-29 DIAGNOSIS — G894 Chronic pain syndrome: Secondary | ICD-10-CM

## 2019-10-29 MED ORDER — RIVAROXABAN 20 MG PO TABS: 20.0000 mg | ORAL_TABLET | Freq: Every day | ORAL | 0 refills | Status: DC

## 2019-10-29 NOTE — Progress Notes (Signed)
Computer error 

## 2019-10-29 NOTE — Progress Notes (Signed)
Established Patient Office Visit  Subjective:  Patient ID: Timothy Marks, male    DOB: [birth year]  Age: 29 y.o. MRN: [id number]  CC:  Chief Complaint  Patient presents with  . Follow-up    HPI Timothy Marks presents for follow-up.  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   He has a history of sickle cell disease and is here today for follow-up.  He is having 7 out of 10 right elbow and leg pain.  He has had 2 emergency room visits and 2-day hospital visits last 1 was on 10/23/2019.  He Denies fever, headache, cough, wheezing, shortness of breath, chest pains, abdominal pain, back pain or hip pain. Denies any open wounds, skin irritation.  He denies any additional concerns or questions today.  He does admit that he drinks Ensure daily and eats approximately 2 meals per day.  He is trying to stay hydrated.   Past Medical History:  Diagnosis Date  . Depression   . Generalized weakness 03/31/2019  . 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 Sickle cell anemia (HCC)   . Vitamin D deficiency y-6    Past Surgical History:  Procedure Laterality Date  . CHOLECYSTECTOMY      Family History  Problem Relation Age of Onset  . Sickle cell trait Mother   . Sickle cell trait Father     Social History   Socioeconomic History  . Marital status: Single    Spouse name: Not on file  . Number of children: Not on file  . Years of education: Not on file  . Highest education level: Not on file  Occupational History  . Not on file  Tobacco Use  . Smoking status: Never Smoker  . Smokeless tobacco: Never Used  Vaping Use  . Vaping Use: Never used  Substance and Sexual Activity  . Alcohol use: No  . Drug use: Not Currently    Types: Marijuana  . Sexual activity:  Not on file  Other Topics Concern  . Not on file  Social History Narrative  . Not on file   Social Determinants of Health   Financial Resource Strain:   . Difficulty of Paying Living Expenses: Not on file  Food Insecurity:   . Worried About Charity fundraiser in the Last Year: Not on file  . Ran Out of Food in the Last Year: Not on file  Transportation Needs:   . Lack of Transportation (Medical): Not on file  . Lack of Transportation (Non-Medical): Not on file  Physical Activity:   . Days of Exercise per Week: Not on file  . Minutes of Exercise per Session: Not on file  Stress:   . Feeling of Stress : Not on file  Social Connections:   . Frequency of Communication with Friends and Family: Not on file  . Frequency of Social Gatherings with Friends and Family: Not on file  . Attends Religious Services: Not on file  . Active Member of Clubs or Organizations: Not on file  . Attends Archivist Meetings: Not on file  . Marital Status: Not on file  Intimate Partner Violence:   . Fear of Current or Ex-Partner: Not on file  . Emotionally Abused: Not on file  . Physically Abused: Not on file  . Sexually  Abused: Not on file    Outpatient Medications Prior to Visit  Medication Sig Dispense Refill  . acetaminophen (TYLENOL) 500 MG tablet Take 500-1,000 mg by mouth every 6 (six) hours as needed for mild pain or headache.    . cholecalciferol (VITAMIN D) 1000 units tablet Take 2 tablets (2,000 Units total) by mouth daily. 30 tablet 6  . folic acid (FOLVITE) 1 MG tablet Take 1 tablet (1 mg total) by mouth daily. 90 tablet 2  . gabapentin (NEURONTIN) 300 MG capsule Take 1 capsule (300 mg total) by mouth 3 (three) times daily. 90 capsule 5  . hydroxyurea (HYDREA) 500 MG capsule Take 1 capsule (500 mg total) by mouth 2 (two) times daily. May take with food to minimize GI side effects. (Patient taking differently: Take 500 mg by mouth 2 (two) times daily. ) 180 capsule 2  .  Nutritional Supplements (ENSURE ACTIVE HIGH PROTEIN) LIQD Take 1 each by mouth 3 (three) times daily between meals. #90 cans of Ensure (Patient taking differently: Take 1 each by mouth 3 (three) times daily between meals. ) 237 mL 11  . doxycycline (VIBRA-TABS) 100 MG tablet Take 100 mg by mouth 2 (two) times daily.    . OXYCONTIN 20 MG 12 hr tablet Take 1 tablet (20 mg total) by mouth every 12 (twelve) hours. 60 tablet 0  . oxyCODONE-acetaminophen (PERCOCET) 10-325 MG tablet Take 1 tablet by mouth every 6 (six) hours as needed for up to 15 days for pain. (Patient not taking: Reported on 10/28/2019) 60 tablet 0  . RIVAROXABAN (XARELTO) VTE STARTER PACK (15 & 20 MG TABLETS) Follow package directions: Take one 15mg  tablet by mouth twice a day. On day 22, switch to one 20mg  tablet once a day. Take with food. (Patient not taking: Reported on 10/28/2019) 51 each 0   No facility-administered medications prior to visit.    Allergies  Allergen Reactions  . Buprenorphine Hcl Hives  . Levaquin [Levofloxacin] Itching  . Meperidine Rash  . Morphine Hives  . Morphine And Related Hives  . Toradol [Ketorolac Tromethamine] Itching  . Tramadol Hives  . Vancomycin Itching  . Zosyn [Piperacillin Sod-Tazobactam So] Itching, Rash and Other (See Comments)    Has taken rocephin in the past    ROS Review of Systems    Objective:    Physical Exam Constitutional:      General: He is not in acute distress.    Appearance: He is not ill-appearing or toxic-appearing.     Comments: Underweight  HENT:     Head: Normocephalic and atraumatic.     Nose: Nose normal.     Mouth/Throat:     Mouth: Mucous membranes are moist.  Cardiovascular:     Rate and Rhythm: Normal rate and regular rhythm.     Pulses: Normal pulses.     Heart sounds: Normal heart sounds.  Pulmonary:     Effort: Pulmonary effort is normal.     Breath sounds: Normal breath sounds.  Abdominal:     Palpations: Abdomen is soft.   Musculoskeletal:     Cervical back: Normal range of motion.     Comments: Asymmetry bilateral lower extremities left slightly greater than right no pitting edema or pain  Skin:    General: Skin is warm and dry.     Capillary Refill: Capillary refill takes less than 2 seconds.  Neurological:     General: No focal deficit present.     Mental Status: He is alert and  oriented to person, place, and time.  Psychiatric:        Mood and Affect: Mood normal.        Behavior: Behavior normal.        Thought Content: Thought content normal.        Judgment: Judgment normal.     BP 125/66   Pulse 90   Temp (!) 97.4 F (36.3 C) (Temporal)   Ht 6\' 2"  (1.88 m)   Wt 120 lb 9.6 oz (54.7 kg)   SpO2 99%   BMI 15.48 kg/m  Wt Readings from Last 3 Encounters:  10/28/19 120 lb 9.6 oz (54.7 kg)  09/20/19 130 lb (59 kg)  09/10/19 124 lb 12.5 oz (56.6 kg)     There are no preventive care reminders to display for this patient.  There are no preventive care reminders to display for this patient.  Lab Results  Component Value Date   TSH 0.884 08/31/2018   Lab Results  Component Value Date   WBC 11.2 (H) 10/23/2019   HGB 11.1 (L) 10/23/2019   HCT 32.7 (L) 10/23/2019   MCV 83.6 10/23/2019   PLT 415 (H) 10/23/2019   Lab Results  Component Value Date   NA 134 (L) 10/23/2019   K 3.9 10/23/2019   CO2 25 10/23/2019   GLUCOSE 125 (H) 10/23/2019   BUN 10 10/23/2019   CREATININE 0.51 (L) 10/23/2019   BILITOT 2.4 (H) 10/23/2019   ALKPHOS 74 10/23/2019   AST 29 10/23/2019   ALT 19 10/23/2019   PROT 8.0 10/23/2019   ALBUMIN 4.2 10/23/2019   CALCIUM 9.1 10/23/2019   ANIONGAP 10 10/23/2019   No results found for: CHOL No results found for: HDL No results found for: LDLCALC No results found for: TRIG No results found for: CHOLHDL No results found for: HGBA1C    Assessment & Plan:   Problem List Items Addressed This Visit      Other   Underweight - Primary   Chronic pain syndrome    Relevant Medications   oxyCODONE-acetaminophen (PERCOCET) 10-325 MG tablet   OXYCONTIN 20 MG 12 hr tablet (Start on 11/17/2019)   History of DVT (deep vein thrombosis)   Relevant Medications   rivaroxaban (XARELTO) 20 MG TABS tablet   Sickle cell anemia (HCC) (Chronic) We discussed the need for good hydration, monitoring of hydration status, avoidance of heat, cold, stress, and infection triggers. We discussed the risks and benefits of Hydrea, including bone marrow suppression, the possibility of GI upset, skin ulcers, hair thinning, and teratogenicity. The patient was reminded of the need to seek medical attention of any symptoms of bleeding, anemia, or infection. Continue folic acid 1 mg daily to prevent aplastic bone marrow crises.    Relevant Medications   oxyCODONE-acetaminophen (PERCOCET) 10-325 MG tablet    Other Visit Diagnoses    Long term current use of opiate analgesic       Relevant Medications   OXYCONTIN 20 MG 12 hr tablet (Start on 11/17/2019)      Meds ordered this encounter  Medications  . oxyCODONE-acetaminophen (PERCOCET) 10-325 MG tablet    Sig: Take 1 tablet by mouth every 6 (six) hours as needed for up to 15 days for pain.    Dispense:  60 tablet    Refill:  0    Order Specific Question:   Supervising Provider    Answer:   Tresa Garter [id number]  . OXYCONTIN 20 MG 12 hr tablet    Sig:  Take 1 tablet (20 mg total) by mouth every 12 (twelve) hours.    Dispense:  60 tablet    Refill:  0    Please do not refill this prior to 11/17/2019. Thank you.    Order Specific Question:   Supervising Provider    Answer:   Tresa Garter [id number]  . rivaroxaban (XARELTO) 20 MG TABS tablet    Sig: Take 1 tablet (20 mg total) by mouth daily with supper.    Dispense:  68 tablet    Refill:  0    Order Specific Question:   Supervising Provider    Answer:   Tresa Garter [[id number]]    Follow-up: Return in about 2 months (around 12/28/2019).    Vevelyn Francois, NP

## 2019-11-04 ENCOUNTER — Non-Acute Institutional Stay (HOSPITAL_COMMUNITY)
Admission: AD | Admit: 2019-11-04 | Discharge: 2019-11-04 | Disposition: A | Discharge status: Home / Self Care | Payer: Medicaid Other | Source: Ambulatory Visit | Attending: Internal Medicine | Admitting: Internal Medicine

## 2019-11-04 ENCOUNTER — Telehealth (HOSPITAL_COMMUNITY): Payer: Self-pay | Admitting: *Deleted

## 2019-11-04 DIAGNOSIS — E559 Vitamin D deficiency, unspecified: Secondary | ICD-10-CM | POA: Diagnosis not present

## 2019-11-04 DIAGNOSIS — Z79899 Other long term (current) drug therapy: Secondary | ICD-10-CM | POA: Diagnosis not present

## 2019-11-04 DIAGNOSIS — Z880 Allergy status to penicillin: Secondary | ICD-10-CM | POA: Diagnosis not present

## 2019-11-04 DIAGNOSIS — G894 Chronic pain syndrome: Secondary | ICD-10-CM | POA: Diagnosis not present

## 2019-11-04 DIAGNOSIS — Z885 Allergy status to narcotic agent status: Secondary | ICD-10-CM | POA: Diagnosis not present

## 2019-11-04 DIAGNOSIS — F1120 Opioid dependence, uncomplicated: Secondary | ICD-10-CM | POA: Insufficient documentation

## 2019-11-04 DIAGNOSIS — Z888 Allergy status to other drugs, medicaments and biological substances status: Secondary | ICD-10-CM | POA: Insufficient documentation

## 2019-11-04 DIAGNOSIS — D638 Anemia in other chronic diseases classified elsewhere: Secondary | ICD-10-CM | POA: Diagnosis not present

## 2019-11-04 DIAGNOSIS — Z881 Allergy status to other antibiotic agents status: Secondary | ICD-10-CM | POA: Insufficient documentation

## 2019-11-04 DIAGNOSIS — Z7901 Long term (current) use of anticoagulants: Secondary | ICD-10-CM | POA: Insufficient documentation

## 2019-11-04 DIAGNOSIS — D5700 Hb-SS disease with crisis, unspecified: Secondary | ICD-10-CM | POA: Diagnosis present

## 2019-11-04 LAB — URINALYSIS, ROUTINE W REFLEX MICROSCOPIC
Bacteria, UA: NONE SEEN
Bilirubin Urine: NEGATIVE
Glucose, UA: NEGATIVE mg/dL
Ketones, ur: NEGATIVE mg/dL
Leukocytes,Ua: NEGATIVE
Nitrite: NEGATIVE
Protein, ur: 30 mg/dL — AB
Specific Gravity, Urine: 1.011 (ref 1.005–1.030)
pH: 7 (ref 5.0–8.0)

## 2019-11-04 LAB — CBC WITH DIFFERENTIAL/PLATELET
Abs Immature Granulocytes: 0.02 10*3/uL (ref 0.00–0.07)
Basophils Absolute: 0 10*3/uL (ref 0.0–0.1)
Basophils Relative: 0 %
Eosinophils Absolute: 0.4 10*3/uL (ref 0.0–0.5)
Eosinophils Relative: 5 %
HCT: 31.2 % — ABNORMAL LOW (ref 39.0–52.0)
Hemoglobin: 10.7 g/dL — ABNORMAL LOW (ref 13.0–17.0)
Immature Granulocytes: 0 %
Lymphocytes Relative: 19 %
Lymphs Abs: 1.5 10*3/uL (ref 0.7–4.0)
MCH: 29.3 pg (ref 26.0–34.0)
MCHC: 34.3 g/dL (ref 30.0–36.0)
MCV: 85.5 fL (ref 80.0–100.0)
Monocytes Absolute: 0.6 10*3/uL (ref 0.1–1.0)
Monocytes Relative: 7 %
Neutro Abs: 5.4 10*3/uL (ref 1.7–7.7)
Neutrophils Relative %: 69 %
Platelets: 632 10*3/uL — ABNORMAL HIGH (ref 150–400)
RBC: 3.65 MIL/uL — ABNORMAL LOW (ref 4.22–5.81)
RDW: 22.1 % — ABNORMAL HIGH (ref 11.5–15.5)
WBC: 7.9 10*3/uL (ref 4.0–10.5)
nRBC: 1.8 % — ABNORMAL HIGH (ref 0.0–0.2)

## 2019-11-04 LAB — RETICULOCYTES
Immature Retic Fract: 38.2 % — ABNORMAL HIGH (ref 2.3–15.9)
RBC.: 3.67 MIL/uL — ABNORMAL LOW (ref 4.22–5.81)
Retic Count, Absolute: 293.6 10*3/uL — ABNORMAL HIGH (ref 19.0–186.0)
Retic Ct Pct: 8 % — ABNORMAL HIGH (ref 0.4–3.1)

## 2019-11-04 MED ORDER — HYDROMORPHONE 1 MG/ML IV SOLN
INTRAVENOUS | Status: DC
  Administered 2019-11-04: 30 mg via INTRAVENOUS
  Administered 2019-11-04: 10.5 mg via INTRAVENOUS
  Filled 2019-11-04: qty 30

## 2019-11-04 MED ORDER — SENNOSIDES-DOCUSATE SODIUM 8.6-50 MG PO TABS: 1.0000 | ORAL_TABLET | Freq: Two times a day (BID) | ORAL | Status: DC

## 2019-11-04 MED ORDER — KETOROLAC TROMETHAMINE 15 MG/ML IJ SOLN
15.0000 mg | Freq: Four times a day (QID) | INTRAMUSCULAR | Status: DC
  Administered 2019-11-04: 15 mg via INTRAVENOUS
  Filled 2019-11-04: qty 1

## 2019-11-04 MED ORDER — SODIUM CHLORIDE 0.45 % IV SOLN: INTRAVENOUS | Status: DC

## 2019-11-04 MED ORDER — ONDANSETRON HCL 4 MG/2ML IJ SOLN
4.0000 mg | Freq: Four times a day (QID) | INTRAMUSCULAR | Status: DC | PRN
  Administered 2019-11-04: 4 mg via INTRAVENOUS
  Filled 2019-11-04: qty 2

## 2019-11-04 MED ORDER — DIPHENHYDRAMINE HCL 25 MG PO CAPS
25.0000 mg | ORAL_CAPSULE | ORAL | Status: DC | PRN
  Administered 2019-11-04: 25 mg via ORAL
  Filled 2019-11-04: qty 1

## 2019-11-04 MED ORDER — SODIUM CHLORIDE 0.9% FLUSH: 9.0000 mL | INTRAVENOUS | Status: DC | PRN

## 2019-11-04 MED ORDER — POLYETHYLENE GLYCOL 3350 17 G PO PACK: 17.0000 g | PACK | Freq: Every day | ORAL | Status: DC | PRN

## 2019-11-04 MED ORDER — SODIUM CHLORIDE 0.9 % IV SOLN
25.0000 mg | INTRAVENOUS | Status: DC | PRN
  Filled 2019-11-04: qty 0.5

## 2019-11-04 MED ORDER — NALOXONE HCL 0.4 MG/ML IJ SOLN: 0.4000 mg | INTRAMUSCULAR | Status: DC | PRN

## 2019-11-04 NOTE — Discharge Summary (Signed)
Physician Discharge Summary  Timothy Marks ZLD:[id number] DOB: 1991/01/09 DOA: 11/04/2019  PCP: Vevelyn Francois, NP  Admit date: 11/04/2019  Discharge date: 11/04/2019  Time spent: 30 minutes  Discharge Diagnoses:  Active Problems:   Sickle cell anemia with crisis [facility])   Discharge Condition: Stable  Diet recommendation: Regular  History of present illness:  Timothy Marks is a 29 y.o. male with history of sickle cell disease, chronic pain syndrome, opiate dependence and tolerance, history of anemia of chronic disease and oppositional defiance disorder who presented to the day hospital this morning with major complaints of generalized body pain but mostly in his lower back and lower extremities consistent with his typical sickle cell pain crisis.  The pain started early this morning, he took his home pain medication with no sustained relief.  He rates his pain at 9/10, characterized as constant and throbbing.  He denies any fever, headache, chest pain, cough, shortness of breath, nausea, vomiting or diarrhea.  He denies any sick contacts, no recent travel or exposure to COVID-19.  Hospital Course:  Deion Swift was admitted to the day hospital with sickle cell painful crisis. Patient was treated with IV fluid, weight based IV Dilaudid PCA, IV Toradol, clinician assisted doses as deemed appropriate, and other adjunct therapies per sickle cell pain management protocol. Deaken showed significant improvement symptomatically, pain improved from 9/10 to 5/10 at the time of discharge. Patient was discharged home in a hemodynamically stable condition. Domenic will follow-up at the clinic as previously scheduled, continue with home medications as per prior to admission.  Discharge Instructions We discussed the need for good hydration, monitoring of hydration status, avoidance of heat, cold, stress, and infection triggers. We discussed the need to be compliant with taking Hydrea and other home  medications. Geo was reminded of the need to seek medical attention immediately if any symptom of bleeding, anemia, or infection occurs.  Discharge Exam: Vitals:   11/04/19 1202 11/04/19 1411  BP: 126/73 129/65  Pulse: 87 74  Resp: 10 12  Temp:    SpO2: 100% 99%   General appearance: alert, cooperative and no distress Eyes: conjunctivae/corneas clear. PERRL, EOM's intact. Fundi benign. Neck: no adenopathy, no carotid bruit, no JVD, supple, symmetrical, trachea midline and thyroid not enlarged, symmetric, no tenderness/mass/nodules Back: symmetric, no curvature. ROM normal. No CVA tenderness. Resp: clear to auscultation bilaterally Chest wall: no tenderness Cardio: regular rate and rhythm, S1, S2 normal, no murmur, click, rub or gallop GI: soft, non-tender; bowel sounds normal; no masses,  no organomegaly Extremities: extremities normal, atraumatic, no cyanosis or edema Pulses: 2+ and symmetric Skin: Skin color, texture, turgor normal. No rashes or lesions Neurologic: Grossly normal  Discharge Instructions    Diet - low sodium heart healthy   Complete by: As directed    Increase activity slowly   Complete by: As directed      Allergies as of 11/04/2019      Reactions   Buprenorphine Hcl Hives   Levaquin [levofloxacin] Itching   Meperidine Rash   Morphine Hives   Morphine And Related Hives   Toradol [ketorolac Tromethamine] Itching   Tramadol Hives   Vancomycin Itching   Zosyn [piperacillin Sod-tazobactam So] Itching, Rash, Other (See Comments)   Has taken rocephin in the past      Medication List    TAKE these medications   acetaminophen 500 MG tablet Commonly known as: TYLENOL Take 500-1,000 mg by mouth every 6 (six) hours as needed for mild pain or headache.  cholecalciferol 25 MCG (1000 UNIT) tablet Commonly known as: VITAMIN D Take 2 tablets (2,000 Units total) by mouth daily.   Ensure Active High Protein Liqd Take 1 each by mouth 3 (three) times daily  between meals. #90 cans of Ensure What changed: additional instructions   folic acid 1 MG tablet Commonly known as: FOLVITE Take 1 tablet (1 mg total) by mouth daily.   gabapentin 300 MG capsule Commonly known as: NEURONTIN Take 1 capsule (300 mg total) by mouth 3 (three) times daily.   hydroxyurea 500 MG capsule Commonly known as: HYDREA Take 1 capsule (500 mg total) by mouth 2 (two) times daily. May take with food to minimize GI side effects. What changed: additional instructions   oxyCODONE-acetaminophen 10-325 MG tablet Commonly known as: Percocet Take 1 tablet by mouth every 6 (six) hours as needed for up to 15 days for pain.   OxyCONTIN 20 mg 12 hr tablet Generic drug: oxyCODONE Take 1 tablet (20 mg total) by mouth every 12 (twelve) hours. Start taking on: November 17, 2019   rivaroxaban 20 MG Tabs tablet Commonly known as: XARELTO Take 1 tablet (20 mg total) by mouth daily with supper.      Allergies  Allergen Reactions  . Buprenorphine Hcl Hives  . Levaquin [Levofloxacin] Itching  . Meperidine Rash  . Morphine Hives  . Morphine And Related Hives  . Toradol [Ketorolac Tromethamine] Itching  . Tramadol Hives  . Vancomycin Itching  . Zosyn [Piperacillin Sod-Tazobactam So] Itching, Rash and Other (See Comments)    Has taken rocephin in the past     Significant Diagnostic Studies: No results found.  Signed:  Angelica Chessman MD, [place], [place], Julio Sicks, CPE   11/04/2019, 3:11 PM

## 2019-11-04 NOTE — Progress Notes (Signed)
Patient admitted to the day hospital for treatment of sickle cell pain crisis. Reported pain was  8/10 in the back. Placed on Dilaudid PCA, given IV Toradol, PO benadryl and hydrated with IV fluids. At discharge patient reported  pain at 6/10. Declined printed AVS.  Alert, oriented and ambulatory at discharge.

## 2019-11-04 NOTE — Telephone Encounter (Signed)
Patient called requesting to come to the day hospital for sickle cell pain. Patient reports back pain rated 8/10. Reports taking Percocet and Oxycontin at 10:00 pm last night. COVID-19 screening done and patient denies all symptoms and exposures. Denies fever, chest pain, nausea, vomiting, diarrhea, abdominal pain and priapism. Admits to having transportation at discharge without driving self. Dr. Doreene Burke notified. Patient can come to the day hospital for pain management.  Patient advised and expresses an understanding.

## 2019-11-04 NOTE — H&P (Signed)
[facility] History and Physical  Timothy Marks JOA:[id number] DOB: 04-Nov-1990 DOA: 11/04/2019  PCP: Vevelyn Francois, NP   Chief Complaint: Sickle cell pain  HPI: Timothy Marks is a 29 y.o. male with history of sickle cell disease, chronic pain syndrome, opiate dependence and tolerance, history of anemia of chronic disease and oppositional defiance disorder who presented to the day hospital this morning with major complaints of generalized body pain but mostly in his lower back and lower extremities consistent with his typical sickle cell pain crisis.  The pain started early this morning, he took his home pain medication with no sustained relief.  He rates his pain at 9/10, characterized as constant and throbbing.  He denies any fever, headache, chest pain, cough, shortness of breath, nausea, vomiting or diarrhea.  He denies any sick contacts, no recent travel or exposure to COVID-19.  Systemic Review: General: The patient denies anorexia, fever, weight loss Cardiac: Denies chest pain, syncope, palpitations, pedal edema  Respiratory: Denies cough, shortness of breath, wheezing GI: Denies severe indigestion/heartburn, abdominal pain, nausea, vomiting, diarrhea and constipation GU: Denies hematuria, incontinence, dysuria  Musculoskeletal: Denies arthritis  Skin: Denies suspicious skin lesions Neurologic: Denies focal weakness or numbness, change in vision  Past Medical History:  Diagnosis Date  . Depression   . Generalized weakness 03/31/2019  . 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 Sickle cell anemia (HCC)   . Vitamin D deficiency y-6    Past Surgical History:  Procedure Laterality Date  . CHOLECYSTECTOMY      Allergies  Allergen Reactions  . Buprenorphine Hcl Hives  . Levaquin [Levofloxacin] Itching  . Meperidine Rash  . Morphine Hives  . Morphine And Related Hives  . Toradol [Ketorolac Tromethamine]  Itching  . Tramadol Hives  . Vancomycin Itching  . Zosyn [Piperacillin Sod-Tazobactam So] Itching, Rash and Other (See Comments)    Has taken rocephin in the past    Family History  Problem Relation Age of Onset  . Sickle cell trait Mother   . Sickle cell trait Father       Prior to Admission medications   Medication Sig Start Date End Date Taking? Authorizing Provider  acetaminophen (TYLENOL) 500 MG tablet Take 500-1,000 mg by mouth every 6 (six) hours as needed for mild pain or headache.    [provider]  cholecalciferol (VITAMIN D) 1000 units tablet Take 2 tablets (2,000 Units total) by mouth daily. 12/13/17   Azzie Glatter, FNP  folic acid (FOLVITE) 1 MG tablet Take 1 tablet (1 mg total) by mouth daily. 05/01/17   Dorena Dew, FNP  gabapentin (NEURONTIN) 300 MG capsule Take 1 capsule (300 mg total) by mouth 3 (three) times daily. 03/12/19   Dorena Dew, FNP  hydroxyurea (HYDREA) 500 MG capsule Take 1 capsule (500 mg total) by mouth 2 (two) times daily. May take with food to minimize GI side effects. Patient taking differently: Take 500 mg by mouth 2 (two) times daily.  05/01/17   Dorena Dew, FNP  Nutritional Supplements (ENSURE ACTIVE HIGH PROTEIN) LIQD Take 1 each by mouth 3 (three) times daily between meals. #90 cans of Ensure Patient taking differently: Take 1 each by mouth 3 (three) times daily between meals.  11/13/18   Dorena Dew, FNP  oxyCODONE-acetaminophen (PERCOCET) 10-325 MG tablet Take 1 tablet by mouth every 6 (six) hours as needed for up to 15 days for pain.  10/28/19 11/12/19  Vevelyn Francois, NP  OXYCONTIN 20 MG 12 hr tablet Take 1 tablet (20 mg total) by mouth every 12 (twelve) hours. 11/17/19 12/17/19  Vevelyn Francois, NP  rivaroxaban (XARELTO) 20 MG TABS tablet Take 1 tablet (20 mg total) by mouth daily with supper. 10/29/19 01/05/20  Vevelyn Francois, NP     Physical Exam: Vitals:   11/04/19 1007  BP: 125/65  Pulse: 72  Resp: 16   Temp: 98.2 F (36.8 C)  TempSrc: Oral  SpO2: 100%    General: Alert, awake, afebrile, anicteric, not in obvious distress HEENT: Normocephalic and Atraumatic, Mucous membranes pink                PERRLA; EOM intact; No scleral icterus,                 Nares: Patent, Oropharynx: Clear, Fair Dentition                 Neck: FROM, no cervical lymphadenopathy, thyromegaly, carotid bruit or JVD;  CHEST WALL: No tenderness  CHEST: Normal respiration, clear to auscultation bilaterally  HEART: Regular rate and rhythm; no murmurs rubs or gallops  BACK: No kyphosis or scoliosis; no CVA tenderness  ABDOMEN: Positive Bowel Sounds, soft, non-tender; no masses, no organomegaly EXTREMITIES: No cyanosis, clubbing, or edema SKIN:  no rash or ulceration  CNS: Alert and Oriented x 4, Nonfocal exam, CN 2-12 intact  Labs on Admission:  Basic Metabolic Panel: No results for input(s): NA, K, CL, CO2, GLUCOSE, BUN, CREATININE, CALCIUM, MG, PHOS in the last 168 hours. Liver Function Tests: No results for input(s): AST, ALT, ALKPHOS, BILITOT, PROT, ALBUMIN in the last 168 hours. No results for input(s): LIPASE, AMYLASE in the last 168 hours. No results for input(s): AMMONIA in the last 168 hours. CBC: No results for input(s): WBC, NEUTROABS, HGB, HCT, MCV, PLT in the last 168 hours. Cardiac Enzymes: No results for input(s): CKTOTAL, CKMB, CKMBINDEX, TROPONINI in the last 168 hours.  BNP (last 3 results) No results for input(s): BNP in the last 8760 hours.  ProBNP (last 3 results) No results for input(s): PROBNP in the last 8760 hours.  CBG: No results for input(s): GLUCAP in the last 168 hours.   Assessment/Plan Active Problems:   Sickle cell anemia with crisis ([place])   Admits to the Day Hospital for extended observation  IVF 0.45% Saline @ 150 mls/hour  Weight based Dilaudid PCA started within 30 minutes of admission  IV Toradol 15 mg Q 6 H x 2 doses  Acetaminophen 1000 mg x 1  dose  Labs: CBCD, CMP, Retic Count and LDH  Monitor vitals very closely, Re-evaluate pain scale every hour  2 L of Oxygen by Krebs  Patient will be re-evaluated for pain in the context of function and relationship to baseline as care progresses.  If no significant relieve from pain (remains above 5/10) will transfer patient to inpatient services for further evaluation and management  Code Status: Full  Family Communication: None  DVT Prophylaxis: Ambulate as tolerated   Time spent: 35 Minutes  Angelica Chessman, MD, MHA, FACP, FAAP, CPE  If 7PM-7AM, please contact night-coverage [email or web address] 11/04/2019, 10:18 AM

## 2019-11-04 NOTE — Discharge Instructions (Signed)
Sickle Cell Anemia, Adult  Sickle cell anemia is a condition where your red blood cells are shaped like sickles. Red blood cells carry oxygen through the body. Sickle-shaped cells do not live as long as normal red blood cells. They also clump together and block blood from flowing through the blood vessels. This prevents the body from getting enough oxygen. Sickle cell anemia causes organ damage and pain. It also increases the risk of infection. Follow these instructions at home: Medicines  Take over-the-counter and prescription medicines only as told by your doctor.  If you were prescribed an antibiotic medicine, take it as told by your doctor. Do not stop taking the antibiotic even if you start to feel better.  If you develop a fever, do not take medicines to lower the fever right away. Tell your doctor about the fever. Managing pain, stiffness, and swelling  Try these methods to help with pain: ? Use a heating pad. ? Take a warm bath. ? Distract yourself, such as by watching TV. Eating and drinking  Drink enough fluid to keep your pee (urine) clear or pale yellow. Drink more in hot weather and during exercise.  Limit or avoid alcohol.  Eat a healthy diet. Eat plenty of fruits, vegetables, whole grains, and lean protein.  Take vitamins and supplements as told by your doctor. Traveling  When traveling, keep these with you: ? Your medical information. ? The names of your doctors. ? Your medicines.  If you need to take an airplane, talk to your doctor first. Activity  Rest often.  Avoid exercises that make your heart beat much faster, such as jogging. General instructions  Do not use products that have nicotine or tobacco, such as cigarettes and e-cigarettes. If you need help quitting, ask your doctor.  Consider wearing a medical alert bracelet.  Avoid being in high places (high altitudes), such as mountains.  Avoid very hot or cold temperatures.  Avoid places where the  temperature changes a lot.  Keep all follow-up visits as told by your doctor. This is important. Contact a doctor if:  A joint hurts.  Your feet or hands hurt or swell.  You feel tired (fatigued). Get help right away if:  You have symptoms of infection. These include: ? Fever. ? Chills. ? Being very tired. ? Irritability. ? Poor eating. ? Throwing up (vomiting).  You feel dizzy or faint.  You have new stomach pain, especially on the left side.  You have a an erection (priapism) that lasts more than 4 hours.  You have numbness in your arms or legs.  You have a hard time moving your arms or legs.  You have trouble talking.  You have pain that does not go away when you take medicine.  You are short of breath.  You are breathing fast.  You have a long-term cough.  You have pain in your chest.  You have a bad headache.  You have a stiff neck.  Your stomach looks bloated even though you did not eat much.  Your skin is pale.  You suddenly cannot see well. Summary  Sickle cell anemia is a condition where your red blood cells are shaped like sickles.  Follow your doctor's advice on ways to manage pain, food to eat, activities to do, and steps to take for safe travel.  Get medical help right away if you have any signs of infection, such as a fever. This information is not intended to replace advice given to you by   your health care provider. Make sure you discuss any questions you have with your health care provider. Document Revised: 05/25/2018 Document Reviewed: 03/08/2016 Elsevier Patient Education  2020 Elsevier Inc.  

## 2019-11-07 ENCOUNTER — Other Ambulatory Visit: Payer: Self-pay | Admitting: Nurse Practitioner

## 2019-11-07 ENCOUNTER — Telehealth: Payer: Self-pay | Admitting: Nurse Practitioner

## 2019-11-07 DIAGNOSIS — D571 Sickle-cell disease without crisis: Secondary | ICD-10-CM

## 2019-11-07 DIAGNOSIS — G894 Chronic pain syndrome: Secondary | ICD-10-CM

## 2019-11-07 MED ORDER — OXYCODONE-ACETAMINOPHEN 10-325 MG PO TABS: 1.0000 | ORAL_TABLET | Freq: Four times a day (QID) | ORAL | 0 refills | Status: DC | PRN

## 2019-11-07 NOTE — Telephone Encounter (Signed)
Refill date 11/12/19 sent

## 2019-11-15 ENCOUNTER — Telehealth: Payer: Self-pay | Admitting: Nurse Practitioner

## 2019-11-15 NOTE — Telephone Encounter (Signed)
Rx refill for OxyContin. States it's due on the 3rd pf oct.

## 2019-11-15 NOTE — Telephone Encounter (Signed)
Called patient aware that prescription has been sent to [facility] on [address]

## 2019-11-15 NOTE — Telephone Encounter (Signed)
Sent!

## 2019-11-18 ENCOUNTER — Encounter (HOSPITAL_COMMUNITY): Payer: Self-pay | Admitting: Family Medicine

## 2019-11-18 ENCOUNTER — Non-Acute Institutional Stay (HOSPITAL_COMMUNITY)
Admission: AD | Admit: 2019-11-18 | Discharge: 2019-11-18 | Disposition: A | Discharge status: Home / Self Care | Payer: Medicaid Other | Source: Ambulatory Visit | Attending: Internal Medicine | Admitting: Internal Medicine

## 2019-11-18 ENCOUNTER — Telehealth (HOSPITAL_COMMUNITY): Payer: Self-pay | Admitting: General Practice

## 2019-11-18 DIAGNOSIS — Z7901 Long term (current) use of anticoagulants: Secondary | ICD-10-CM | POA: Insufficient documentation

## 2019-11-18 DIAGNOSIS — M79606 Pain in leg, unspecified: Secondary | ICD-10-CM | POA: Insufficient documentation

## 2019-11-18 DIAGNOSIS — E559 Vitamin D deficiency, unspecified: Secondary | ICD-10-CM | POA: Insufficient documentation

## 2019-11-18 DIAGNOSIS — Z880 Allergy status to penicillin: Secondary | ICD-10-CM | POA: Diagnosis not present

## 2019-11-18 DIAGNOSIS — Z885 Allergy status to narcotic agent status: Secondary | ICD-10-CM | POA: Diagnosis not present

## 2019-11-18 DIAGNOSIS — F1120 Opioid dependence, uncomplicated: Secondary | ICD-10-CM | POA: Insufficient documentation

## 2019-11-18 DIAGNOSIS — Z888 Allergy status to other drugs, medicaments and biological substances status: Secondary | ICD-10-CM | POA: Insufficient documentation

## 2019-11-18 DIAGNOSIS — G894 Chronic pain syndrome: Secondary | ICD-10-CM | POA: Insufficient documentation

## 2019-11-18 DIAGNOSIS — Z9049 Acquired absence of other specified parts of digestive tract: Secondary | ICD-10-CM | POA: Insufficient documentation

## 2019-11-18 DIAGNOSIS — Z881 Allergy status to other antibiotic agents status: Secondary | ICD-10-CM | POA: Diagnosis not present

## 2019-11-18 DIAGNOSIS — Z832 Family history of diseases of the blood and blood-forming organs and certain disorders involving the immune mechanism: Secondary | ICD-10-CM | POA: Diagnosis not present

## 2019-11-18 DIAGNOSIS — D638 Anemia in other chronic diseases classified elsewhere: Secondary | ICD-10-CM | POA: Insufficient documentation

## 2019-11-18 DIAGNOSIS — D5700 Hb-SS disease with crisis, unspecified: Secondary | ICD-10-CM | POA: Diagnosis not present

## 2019-11-18 DIAGNOSIS — Z79899 Other long term (current) drug therapy: Secondary | ICD-10-CM | POA: Insufficient documentation

## 2019-11-18 LAB — CBC WITH DIFFERENTIAL/PLATELET
Abs Immature Granulocytes: 0.05 10*3/uL (ref 0.00–0.07)
Basophils Absolute: 0 10*3/uL (ref 0.0–0.1)
Basophils Relative: 1 %
Eosinophils Absolute: 0.5 10*3/uL (ref 0.0–0.5)
Eosinophils Relative: 6 %
HCT: 28.9 % — ABNORMAL LOW (ref 39.0–52.0)
Hemoglobin: 9.9 g/dL — ABNORMAL LOW (ref 13.0–17.0)
Immature Granulocytes: 1 %
Lymphocytes Relative: 21 %
Lymphs Abs: 1.7 10*3/uL (ref 0.7–4.0)
MCH: 29.8 pg (ref 26.0–34.0)
MCHC: 34.3 g/dL (ref 30.0–36.0)
MCV: 87 fL (ref 80.0–100.0)
Monocytes Absolute: 0.6 10*3/uL (ref 0.1–1.0)
Monocytes Relative: 8 %
Neutro Abs: 5 10*3/uL (ref 1.7–7.7)
Neutrophils Relative %: 63 %
Platelets: 491 10*3/uL — ABNORMAL HIGH (ref 150–400)
RBC: 3.32 MIL/uL — ABNORMAL LOW (ref 4.22–5.81)
RDW: 19.3 % — ABNORMAL HIGH (ref 11.5–15.5)
WBC: 7.9 10*3/uL (ref 4.0–10.5)
nRBC: 1.3 % — ABNORMAL HIGH (ref 0.0–0.2)

## 2019-11-18 LAB — COMPREHENSIVE METABOLIC PANEL
ALT: 13 U/L (ref 0–44)
AST: 26 U/L (ref 15–41)
Albumin: 4.4 g/dL (ref 3.5–5.0)
Alkaline Phosphatase: 66 U/L (ref 38–126)
Anion gap: 10 (ref 5–15)
BUN: 9 mg/dL (ref 6–20)
CO2: 25 mmol/L (ref 22–32)
Calcium: 9 mg/dL (ref 8.9–10.3)
Chloride: 103 mmol/L (ref 98–111)
Creatinine, Ser: 0.55 mg/dL — ABNORMAL LOW (ref 0.61–1.24)
GFR calc Af Amer: >60 mL/min (ref >60)
GFR calc non Af Amer: >60 mL/min (ref >60)
Glucose, Bld: 88 mg/dL (ref 70–99)
Potassium: 3.5 mmol/L (ref 3.5–5.1)
Sodium: 138 mmol/L (ref 135–145)
Total Bilirubin: 3.6 mg/dL — ABNORMAL HIGH (ref 0.3–1.2)
Total Protein: 7.9 g/dL (ref 6.5–8.1)

## 2019-11-18 LAB — RAPID URINE DRUG SCREEN, HOSP PERFORMED
Amphetamines: NOT DETECTED
Barbiturates: NOT DETECTED
Benzodiazepines: NOT DETECTED
Cocaine: NOT DETECTED
Opiates: NOT DETECTED
Tetrahydrocannabinol: POSITIVE — AB

## 2019-11-18 LAB — RETICULOCYTES
Immature Retic Fract: 42.7 % — ABNORMAL HIGH (ref 2.3–15.9)
RBC.: 3.27 MIL/uL — ABNORMAL LOW (ref 4.22–5.81)
Retic Count, Absolute: 254.4 10*3/uL — ABNORMAL HIGH (ref 19.0–186.0)
Retic Ct Pct: 7.8 % — ABNORMAL HIGH (ref 0.4–3.1)

## 2019-11-18 MED ORDER — HYDROMORPHONE 1 MG/ML IV SOLN
INTRAVENOUS | Status: DC
  Administered 2019-11-18: 30 mg via INTRAVENOUS
  Administered 2019-11-18: 9 mg via INTRAVENOUS
  Filled 2019-11-18: qty 30

## 2019-11-18 MED ORDER — NALOXONE HCL 0.4 MG/ML IJ SOLN: 0.4000 mg | INTRAMUSCULAR | Status: DC | PRN

## 2019-11-18 MED ORDER — ONDANSETRON HCL 4 MG/2ML IJ SOLN
4.0000 mg | Freq: Four times a day (QID) | INTRAMUSCULAR | Status: DC | PRN
  Administered 2019-11-18: 4 mg via INTRAVENOUS
  Filled 2019-11-18: qty 2

## 2019-11-18 MED ORDER — SODIUM CHLORIDE 0.9% FLUSH: 9.0000 mL | INTRAVENOUS | Status: DC | PRN

## 2019-11-18 MED ORDER — DIPHENHYDRAMINE HCL 25 MG PO CAPS
25.0000 mg | ORAL_CAPSULE | Freq: Once | ORAL | Status: AC
  Administered 2019-11-18: 25 mg via ORAL
  Filled 2019-11-18: qty 1

## 2019-11-18 MED ORDER — KETOROLAC TROMETHAMINE 30 MG/ML IJ SOLN
15.0000 mg | Freq: Once | INTRAMUSCULAR | Status: AC
  Administered 2019-11-18: 15 mg via INTRAVENOUS
  Filled 2019-11-18: qty 1

## 2019-11-18 MED ORDER — SODIUM CHLORIDE 0.9 % IV SOLN
25.0000 mg | INTRAVENOUS | Status: DC | PRN
  Filled 2019-11-18: qty 0.5

## 2019-11-18 MED ORDER — ACETAMINOPHEN 500 MG PO TABS
1000.0000 mg | ORAL_TABLET | Freq: Once | ORAL | Status: AC
  Administered 2019-11-18: 1000 mg via ORAL
  Filled 2019-11-18: qty 2

## 2019-11-18 MED ORDER — DEXTROSE-NACL 5-0.45 % IV SOLN: INTRAVENOUS | Status: DC

## 2019-11-18 MED ORDER — DIPHENHYDRAMINE HCL 25 MG PO CAPS: 25.0000 mg | ORAL_CAPSULE | ORAL | Status: DC | PRN

## 2019-11-18 MED FILL — OxyCONTIN 20 MG T12A: 20 | 30 days supply | Qty: 60 | Fill #0

## 2019-11-18 NOTE — Progress Notes (Signed)
Patient admitted to the day hospital for treatment of sickle cell pain crisis. Patient reported pain rated 10/10 in the back. Patient placed on Dilaudid PCA, given PO Tylenol, IV Zofran, PO benadryl  and hydrated with IV fluids. At discharge patient reported  pain at 6/10. Discharge instructions given to patient. Patient alert, oriented and ambulatory at discharge.

## 2019-11-18 NOTE — Discharge Instructions (Signed)
Sickle Cell Anemia, Adult  Sickle cell anemia is a condition where your red blood cells are shaped like sickles. Red blood cells carry oxygen through the body. Sickle-shaped cells do not live as long as normal red blood cells. They also clump together and block blood from flowing through the blood vessels. This prevents the body from getting enough oxygen. Sickle cell anemia causes organ damage and pain. It also increases the risk of infection. Follow these instructions at home: Medicines  Take over-the-counter and prescription medicines only as told by your doctor.  If you were prescribed an antibiotic medicine, take it as told by your doctor. Do not stop taking the antibiotic even if you start to feel better.  If you develop a fever, do not take medicines to lower the fever right away. Tell your doctor about the fever. Managing pain, stiffness, and swelling  Try these methods to help with pain: ? Use a heating pad. ? Take a warm bath. ? Distract yourself, such as by watching TV. Eating and drinking  Drink enough fluid to keep your pee (urine) clear or pale yellow. Drink more in hot weather and during exercise.  Limit or avoid alcohol.  Eat a healthy diet. Eat plenty of fruits, vegetables, whole grains, and lean protein.  Take vitamins and supplements as told by your doctor. Traveling  When traveling, keep these with you: ? Your medical information. ? The names of your doctors. ? Your medicines.  If you need to take an airplane, talk to your doctor first. Activity  Rest often.  Avoid exercises that make your heart beat much faster, such as jogging. General instructions  Do not use products that have nicotine or tobacco, such as cigarettes and e-cigarettes. If you need help quitting, ask your doctor.  Consider wearing a medical alert bracelet.  Avoid being in high places (high altitudes), such as mountains.  Avoid very hot or cold temperatures.  Avoid places where the  temperature changes a lot.  Keep all follow-up visits as told by your doctor. This is important. Contact a doctor if:  A joint hurts.  Your feet or hands hurt or swell.  You feel tired (fatigued). Get help right away if:  You have symptoms of infection. These include: ? Fever. ? Chills. ? Being very tired. ? Irritability. ? Poor eating. ? Throwing up (vomiting).  You feel dizzy or faint.  You have new stomach pain, especially on the left side.  You have a an erection (priapism) that lasts more than 4 hours.  You have numbness in your arms or legs.  You have a hard time moving your arms or legs.  You have trouble talking.  You have pain that does not go away when you take medicine.  You are short of breath.  You are breathing fast.  You have a long-term cough.  You have pain in your chest.  You have a bad headache.  You have a stiff neck.  Your stomach looks bloated even though you did not eat much.  Your skin is pale.  You suddenly cannot see well. Summary  Sickle cell anemia is a condition where your red blood cells are shaped like sickles.  Follow your doctor's advice on ways to manage pain, food to eat, activities to do, and steps to take for safe travel.  Get medical help right away if you have any signs of infection, such as a fever. This information is not intended to replace advice given to you by   your health care provider. Make sure you discuss any questions you have with your health care provider. Document Revised: 05/25/2018 Document Reviewed: 03/08/2016 Elsevier Patient Education  2020 Elsevier Inc.  

## 2019-11-18 NOTE — H&P (Signed)
[facility] History and Physical   Date: 11/18/2019  Patient name: Timothy Marks Medical record number: [id number] Date of birth: 02/12/1991 Age: 29 y.o. Gender: male PCP: Vevelyn Francois, NP  Attending physician: Tresa Garter, MD  Chief Complaint: Sickle cell pain  History of Present Illness: Timothy Marks is a 29 year old male with a medical history significant for sickle cell disease type SS, chronic pain syndrome, opiate dependence and tolerance, history of oppositional defiance disorder, and history of anemia of chronic disease presents complaining of low back and lower extremity pain that is consistent with his typical pain crisis.  He attributes pain crisis to changes in weather.  He awakened 2 days ago with pain characterized as constant and aching.  Pain has been unrelieved by home medications.  He last had both OxyContin and Percocet this a.m. without sustained relief.  Patient rates pain as 9/10.  He has had no sick contacts, recent travel, or exposure to COVID-19.  He denies headache, fever, chills, chest pain, urinary symptoms, nausea, vomiting, or diarrhea.    Meds: Medications Prior to Admission  Medication Sig Dispense Refill Last Dose  . acetaminophen (TYLENOL) 500 MG tablet Take 500-1,000 mg by mouth every 6 (six) hours as needed for mild pain or headache.     . cholecalciferol (VITAMIN D) 1000 units tablet Take 2 tablets (2,000 Units total) by mouth daily. 30 tablet 6   . folic acid (FOLVITE) 1 MG tablet Take 1 tablet (1 mg total) by mouth daily. 90 tablet 2   . gabapentin (NEURONTIN) 300 MG capsule Take 1 capsule (300 mg total) by mouth 3 (three) times daily. 90 capsule 5   . hydroxyurea (HYDREA) 500 MG capsule Take 1 capsule (500 mg total) by mouth 2 (two) times daily. May take with food to minimize GI side effects. (Patient taking differently: Take 500 mg by mouth 2 (two) times daily. ) 180 capsule 2   . Nutritional Supplements (ENSURE ACTIVE  HIGH PROTEIN) LIQD Take 1 each by mouth 3 (three) times daily between meals. #90 cans of Ensure (Patient taking differently: Take 1 each by mouth 3 (three) times daily between meals. ) 237 mL 11   . oxyCODONE-acetaminophen (PERCOCET) 10-325 MG tablet Take 1 tablet by mouth every 6 (six) hours as needed for up to 15 days for pain. 60 tablet 0   . OXYCONTIN 20 MG 12 hr tablet Take 1 tablet (20 mg total) by mouth every 12 (twelve) hours. 60 tablet 0   . rivaroxaban (XARELTO) 20 MG TABS tablet Take 1 tablet (20 mg total) by mouth daily with supper. 68 tablet 0     Allergies: Buprenorphine hcl, Levaquin [levofloxacin], Meperidine, Morphine, Morphine and related, Toradol [ketorolac tromethamine], Tramadol, Vancomycin, and Zosyn [piperacillin sod-tazobactam so] Past Medical History:  Diagnosis Date  . Depression   . Generalized weakness 03/31/2019  . 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 Sickle cell anemia (HCC)   . Vitamin D deficiency y-6   Past Surgical History:  Procedure Laterality Date  . CHOLECYSTECTOMY     Family History  Problem Relation Age of Onset  . Sickle cell trait Mother   . Sickle cell trait Father    Social History   Socioeconomic History  . Marital status: Single    Spouse name: Not on file  . Number of children: Not on file  . Years of education: Not on file  . Highest education level:  Not on file  Occupational History  . Not on file  Tobacco Use  . Smoking status: Never Smoker  . Smokeless tobacco: Never Used  Vaping Use  . Vaping Use: Never used  Substance and Sexual Activity  . Alcohol use: No  . Drug use: Not Currently    Types: Marijuana  . Sexual activity: Not on file  Other Topics Concern  . Not on file  Social History Narrative  . Not on file   Social Determinants of Health   Financial Resource Strain:   . Difficulty of Paying Living Expenses: Not on file  Food Insecurity:   .  Worried About Charity fundraiser in the Last Year: Not on file  . Ran Out of Food in the Last Year: Not on file  Transportation Needs:   . Lack of Transportation (Medical): Not on file  . Lack of Transportation (Non-Medical): Not on file  Physical Activity:   . Days of Exercise per Week: Not on file  . Minutes of Exercise per Session: Not on file  Stress:   . Feeling of Stress : Not on file  Social Connections:   . Frequency of Communication with Friends and Family: Not on file  . Frequency of Social Gatherings with Friends and Family: Not on file  . Attends Religious Services: Not on file  . Active Member of Clubs or Organizations: Not on file  . Attends Archivist Meetings: Not on file  . Marital Status: Not on file  Intimate Partner Violence:   . Fear of Current or Ex-Partner: Not on file  . Emotionally Abused: Not on file  . Physically Abused: Not on file  . Sexually Abused: Not on file   Review of Systems  Constitutional: Negative for chills and fever.  HENT: Negative.   Eyes: Negative.   Respiratory: Negative.   Cardiovascular: Negative.   Gastrointestinal: Negative.   Genitourinary: Negative.   Musculoskeletal: Positive for back pain and joint pain.  Skin: Negative.   Neurological: Negative.   Psychiatric/Behavioral: Negative.     Physical Exam: There were no vitals taken for this visit. Physical Exam Constitutional:      Appearance: Normal appearance.  Eyes:     Pupils: Pupils are equal, round, and reactive to light.  Cardiovascular:     Rate and Rhythm: Normal rate and regular rhythm.  Abdominal:     General: Abdomen is flat. Bowel sounds are normal.  Neurological:     Mental Status: He is alert.  Psychiatric:        Mood and Affect: Mood normal.        Behavior: Behavior normal.        Thought Content: Thought content normal.        Judgment: Judgment normal.      Lab results: No results found for this or any previous visit (from the  past 24 hour(s)).  Imaging results:  No results found.   Assessment & Plan: Patient admitted to sickle cell day infusion center for management of pain crisis.  Patient is opiate naive Initiate IV dilaudid PCA. Settings of 0.5 mg, 10 minute lockout, and 3 mg/hr. IV fluids, 0.45% saline at 100 ml/hr Toradol 15 mg IV times one dose Tylenol 1000 mg by mouth times one dose Review CBC with differential, complete metabolic panel, and reticulocytes as results become available. Baseline hemoglobin is 9-10 Pain intensity will be reevaluated in context of functioning and relationship to baseline as care progress If pain intensity  remains elevated and/or sudden change in hemodynamic stability transition to inpatient services for higher level of care.   [name]  APRN, MSN, FNP-C [facility] [address] [place], [place] [address] [phone]    11/18/2019, 9:15 AM

## 2019-11-18 NOTE — Telephone Encounter (Signed)
Patient called, requesting to come to the day hospital due to pain in the back rated at 10/10. Denied chest pain, fever, diarrhea, abdominal pain, nausea/vomitting and priapism. Screened negative for Covid-19 symptoms. Admitted to having means of transportation without driving self after treatment. Last took percocet at 01:00 am today. Per provider, patient can come to the day hospital for treatment. Patient notified, verbalized understanding.

## 2019-11-18 NOTE — Discharge Summary (Signed)
[facility] Discharge Summary   Patient ID: Timothy Marks MRN: [id number] DOB/AGE: 1991/01/17 29 y.o.  Admit date: 11/18/2019 Discharge date: 11/18/2019  Primary Care Physician:  Vevelyn Francois, NP  Admission Diagnoses:  Active Problems:   Sickle cell pain crisis [facility])   Discharge Medications:  Allergies as of 11/18/2019      Reactions   Buprenorphine Hcl Hives   Levaquin [levofloxacin] Itching   Meperidine Rash   Morphine Hives   Morphine And Related Hives   Toradol [ketorolac Tromethamine] Itching   Tramadol Hives   Vancomycin Itching   Zosyn [piperacillin Sod-tazobactam So] Itching, Rash, Other (See Comments)   Has taken rocephin in the past      Medication List    TAKE these medications   acetaminophen 500 MG tablet Commonly known as: TYLENOL Take 500-1,000 mg by mouth every 6 (six) hours as needed for mild pain or headache.   cholecalciferol 25 MCG (1000 UNIT) tablet Commonly known as: VITAMIN D Take 2 tablets (2,000 Units total) by mouth daily.   Ensure Active High Protein Liqd Take 1 each by mouth 3 (three) times daily between meals. #90 cans of Ensure What changed: additional instructions   folic acid 1 MG tablet Commonly known as: FOLVITE Take 1 tablet (1 mg total) by mouth daily.   gabapentin 300 MG capsule Commonly known as: NEURONTIN Take 1 capsule (300 mg total) by mouth 3 (three) times daily.   hydroxyurea 500 MG capsule Commonly known as: HYDREA Take 1 capsule (500 mg total) by mouth 2 (two) times daily. May take with food to minimize GI side effects. What changed: additional instructions   oxyCODONE-acetaminophen 10-325 MG tablet Commonly known as: Percocet Take 1 tablet by mouth every 6 (six) hours as needed for up to 15 days for pain.   OxyCONTIN 20 mg 12 hr tablet Generic drug: oxyCODONE Take 1 tablet (20 mg total) by mouth every 12 (twelve) hours.   rivaroxaban 20 MG Tabs tablet Commonly known as: XARELTO Take 1  tablet (20 mg total) by mouth daily with supper.        Consults:  None  Significant Diagnostic Studies:  No results found.  History of present illness:  Timothy Marks is a 29 year old male with a medical history significant for sickle cell disease type SS, chronic pain syndrome, opiate dependence and tolerance, history of oppositional defiance disorder, and history of anemia of chronic disease presents complaining of low back and lower extremity pain that is consistent with his typical pain crisis.  He attributes pain crisis to changes in weather.  He awakened 2 days ago with pain characterized as constant and aching.  Pain has been unrelieved by home medications.  He last had both OxyContin and Percocet this a.m. without sustained relief.  Patient rates pain as 9/10.  He has had no sick contacts, recent travel, or exposure to COVID-19.  He denies headache, fever, chills, chest pain, urinary symptoms, nausea, vomiting, or diarrhea.   [facility] Course:  Patient admitted to sickle cell day clinic for management of pain crisis.  Reviewed all laboratory values. Patient stated that he had been taking all prescribed opiate medications consistently and without relief.  However no opiates were detected in urine per urine drug screen.  Tetrahydrocannabinol was detected.  Patient's hemoglobin was 9.9, which is consistent with his baseline.  Pain managed with IV Dilaudid via PCA with settings of 0.5 mg, 10-minute lockout, and 3 mg/h. Toradol 15 mg IV x1  Tylenol 1000 mg by mouth x1 IV fluids, 0.45% saline at 100 mL/h Pain intensity decreased to 5/10.  Patient counseled at length on the importance of taking medications consistently for chronic pain in order to achieve positive outcomes.  Patient claims that he has been taking opiate medications consistently.  Shared patient's urine drug screen.  Recommend that he follows up with PCP for further medication management.  He may warrant  referral to pain management.  Patient is alert, oriented, and ambulating without assistance and will discharge home in a hemodynamically stable condition.  Discharge instructions: Resume all home medications.   Follow up with PCP as previously  scheduled.   Discussed the importance of drinking 64 ounces of water daily, dehydration of red blood cells may lead further sickling.   Avoid all stressors that precipitate sickle cell pain crisis.     The patient was given clear instructions to go to ER or return to medical center if symptoms do not improve, worsen or new problems develop.    Physical Exam at Discharge:  BP (!) 106/53 (BP Location: Left Arm)   Pulse 77   Temp 98 F (36.7 C) (Temporal)   Resp 11   SpO2 100%   Physical Exam Constitutional:      Appearance: Normal appearance.  Eyes:     Pupils: Pupils are equal, round, and reactive to light.  Cardiovascular:     Rate and Rhythm: Normal rate and regular rhythm.     Pulses: Normal pulses.  Pulmonary:     Effort: Pulmonary effort is normal.  Abdominal:     General: Abdomen is flat. Bowel sounds are normal.  Musculoskeletal:        General: Normal range of motion.  Skin:    General: Skin is warm.  Neurological:     General: No focal deficit present.     Mental Status: He is alert. Mental status is at baseline.  Psychiatric:        Mood and Affect: Mood normal.        Thought Content: Thought content normal.        Judgment: Judgment normal.      Disposition at Discharge: Discharge disposition: 01-Home or Self Care       Discharge Orders: Discharge Instructions    Discharge patient   Complete by: As directed    Discharge disposition: 01-Home or Self Care   Discharge patient date: 11/18/2019      Condition at Discharge:   Stable  Time spent on Discharge:  Greater than 30 minutes.  Signed: [name]  APRN, MSN, FNP-C Patient [facility] [address] [place], [place] [address] [phone] 11/18/2019, 3:08 PM

## 2019-11-18 NOTE — Telephone Encounter (Signed)
No note

## 2019-11-25 ENCOUNTER — Telehealth: Payer: Self-pay | Admitting: Nurse Practitioner

## 2019-11-26 ENCOUNTER — Non-Acute Institutional Stay (HOSPITAL_COMMUNITY)
Admission: AD | Admit: 2019-11-26 | Discharge: 2019-11-26 | Disposition: A | Discharge status: Home / Self Care | Payer: Medicaid Other | Source: Ambulatory Visit | Attending: Internal Medicine | Admitting: Internal Medicine

## 2019-11-26 ENCOUNTER — Encounter (HOSPITAL_COMMUNITY): Payer: Self-pay | Admitting: Family Medicine

## 2019-11-26 ENCOUNTER — Telehealth (HOSPITAL_COMMUNITY): Payer: Self-pay | Admitting: General Practice

## 2019-11-26 DIAGNOSIS — Z880 Allergy status to penicillin: Secondary | ICD-10-CM | POA: Insufficient documentation

## 2019-11-26 DIAGNOSIS — G894 Chronic pain syndrome: Secondary | ICD-10-CM | POA: Diagnosis not present

## 2019-11-26 DIAGNOSIS — D638 Anemia in other chronic diseases classified elsewhere: Secondary | ICD-10-CM | POA: Insufficient documentation

## 2019-11-26 DIAGNOSIS — Z888 Allergy status to other drugs, medicaments and biological substances status: Secondary | ICD-10-CM | POA: Insufficient documentation

## 2019-11-26 DIAGNOSIS — Z881 Allergy status to other antibiotic agents status: Secondary | ICD-10-CM | POA: Diagnosis not present

## 2019-11-26 DIAGNOSIS — Z86718 Personal history of other venous thrombosis and embolism: Secondary | ICD-10-CM | POA: Diagnosis not present

## 2019-11-26 DIAGNOSIS — F32A Depression, unspecified: Secondary | ICD-10-CM | POA: Insufficient documentation

## 2019-11-26 DIAGNOSIS — Z832 Family history of diseases of the blood and blood-forming organs and certain disorders involving the immune mechanism: Secondary | ICD-10-CM | POA: Insufficient documentation

## 2019-11-26 DIAGNOSIS — D5700 Hb-SS disease with crisis, unspecified: Secondary | ICD-10-CM | POA: Diagnosis present

## 2019-11-26 DIAGNOSIS — Z9049 Acquired absence of other specified parts of digestive tract: Secondary | ICD-10-CM | POA: Diagnosis not present

## 2019-11-26 DIAGNOSIS — Z885 Allergy status to narcotic agent status: Secondary | ICD-10-CM | POA: Diagnosis not present

## 2019-11-26 DIAGNOSIS — Z7901 Long term (current) use of anticoagulants: Secondary | ICD-10-CM | POA: Diagnosis not present

## 2019-11-26 DIAGNOSIS — R531 Weakness: Secondary | ICD-10-CM | POA: Insufficient documentation

## 2019-11-26 DIAGNOSIS — Z79899 Other long term (current) drug therapy: Secondary | ICD-10-CM | POA: Diagnosis not present

## 2019-11-26 DIAGNOSIS — F1120 Opioid dependence, uncomplicated: Secondary | ICD-10-CM | POA: Insufficient documentation

## 2019-11-26 LAB — CBC WITH DIFFERENTIAL/PLATELET
Abs Immature Granulocytes: 0.03 10*3/uL (ref 0.00–0.07)
Basophils Absolute: 0 10*3/uL (ref 0.0–0.1)
Basophils Relative: 0 %
Eosinophils Absolute: 0.4 10*3/uL (ref 0.0–0.5)
Eosinophils Relative: 4 %
HCT: 27.7 % — ABNORMAL LOW (ref 39.0–52.0)
Hemoglobin: 9.6 g/dL — ABNORMAL LOW (ref 13.0–17.0)
Immature Granulocytes: 0 %
Lymphocytes Relative: 18 %
Lymphs Abs: 1.8 10*3/uL (ref 0.7–4.0)
MCH: 30 pg (ref 26.0–34.0)
MCHC: 34.7 g/dL (ref 30.0–36.0)
MCV: 86.6 fL (ref 80.0–100.0)
Monocytes Absolute: 0.9 10*3/uL (ref 0.1–1.0)
Monocytes Relative: 9 %
Neutro Abs: 6.9 10*3/uL (ref 1.7–7.7)
Neutrophils Relative %: 69 %
Platelets: 426 10*3/uL — ABNORMAL HIGH (ref 150–400)
RBC: 3.2 MIL/uL — ABNORMAL LOW (ref 4.22–5.81)
RDW: 18.9 % — ABNORMAL HIGH (ref 11.5–15.5)
WBC: 10.1 10*3/uL (ref 4.0–10.5)
nRBC: 0.9 % — ABNORMAL HIGH (ref 0.0–0.2)

## 2019-11-26 LAB — RETICULOCYTES
Immature Retic Fract: 30.5 % — ABNORMAL HIGH (ref 2.3–15.9)
RBC.: 3.16 MIL/uL — ABNORMAL LOW (ref 4.22–5.81)
Retic Count, Absolute: 263.2 10*3/uL — ABNORMAL HIGH (ref 19.0–186.0)
Retic Ct Pct: 8.3 % — ABNORMAL HIGH (ref 0.4–3.1)

## 2019-11-26 MED ORDER — DIPHENHYDRAMINE HCL 25 MG PO CAPS
25.0000 mg | ORAL_CAPSULE | Freq: Once | ORAL | Status: AC
  Administered 2019-11-26: 25 mg via ORAL
  Filled 2019-11-26: qty 1

## 2019-11-26 MED ORDER — DEXTROSE-NACL 5-0.45 % IV SOLN: INTRAVENOUS | Status: DC

## 2019-11-26 MED ORDER — HYDROMORPHONE 1 MG/ML IV SOLN
INTRAVENOUS | Status: DC
  Administered 2019-11-26: 30 mg via INTRAVENOUS
  Administered 2019-11-26: 9.5 mg via INTRAVENOUS
  Filled 2019-11-26: qty 30

## 2019-11-26 MED ORDER — KETOROLAC TROMETHAMINE 30 MG/ML IJ SOLN
15.0000 mg | Freq: Once | INTRAMUSCULAR | Status: AC
  Administered 2019-11-26: 15 mg via INTRAVENOUS
  Filled 2019-11-26: qty 1

## 2019-11-26 MED ORDER — SODIUM CHLORIDE 0.9% FLUSH: 9.0000 mL | INTRAVENOUS | Status: DC | PRN

## 2019-11-26 MED ORDER — NALOXONE HCL 0.4 MG/ML IJ SOLN: 0.4000 mg | INTRAMUSCULAR | Status: DC | PRN

## 2019-11-26 MED ORDER — ACETAMINOPHEN 500 MG PO TABS
1000.0000 mg | ORAL_TABLET | Freq: Once | ORAL | Status: AC
  Administered 2019-11-26: 1000 mg via ORAL
  Filled 2019-11-26: qty 2

## 2019-11-26 MED ORDER — ONDANSETRON HCL 4 MG/2ML IJ SOLN: 4.0000 mg | Freq: Four times a day (QID) | INTRAMUSCULAR | Status: DC | PRN

## 2019-11-26 NOTE — Progress Notes (Signed)
Patient admitted to the day hospital for treatment of sickle cell pain crisis. Patient reported pain rated 10/10 in the back. Patient placed on Dilaudid PCA, given PO Tylenol, IV Toradol, PO benadryl and hydrated with IV fluids. At discharge patient reported  pain at 5/10. Printed discharge instructions given but patient declined. Alert, oriented and ambulatory at discharge.

## 2019-11-26 NOTE — Telephone Encounter (Signed)
Patient called, requesting to come to the day hospital due to pain in the back rated at 10/10. Denied chest pain, fever, diarrhea, abdominal pain, nausea/vomitting and priapism. Screened negative for Covid-19 symptoms. Admitted to having means of transportation without driving self after treatment. Last took 20 mg of oxycontin at 01:00 am today. Per provider, patient can come to the day hospital for treatment. Patient notified, verbalized understanding.

## 2019-11-26 NOTE — Discharge Instructions (Signed)
Sickle Cell Anemia, Adult  Sickle cell anemia is a condition where your red blood cells are shaped like sickles. Red blood cells carry oxygen through the body. Sickle-shaped cells do not live as long as normal red blood cells. They also clump together and block blood from flowing through the blood vessels. This prevents the body from getting enough oxygen. Sickle cell anemia causes organ damage and pain. It also increases the risk of infection. Follow these instructions at home: Medicines  Take over-the-counter and prescription medicines only as told by your doctor.  If you were prescribed an antibiotic medicine, take it as told by your doctor. Do not stop taking the antibiotic even if you start to feel better.  If you develop a fever, do not take medicines to lower the fever right away. Tell your doctor about the fever. Managing pain, stiffness, and swelling  Try these methods to help with pain: ? Use a heating pad. ? Take a warm bath. ? Distract yourself, such as by watching TV. Eating and drinking  Drink enough fluid to keep your pee (urine) clear or pale yellow. Drink more in hot weather and during exercise.  Limit or avoid alcohol.  Eat a healthy diet. Eat plenty of fruits, vegetables, whole grains, and lean protein.  Take vitamins and supplements as told by your doctor. Traveling  When traveling, keep these with you: ? Your medical information. ? The names of your doctors. ? Your medicines.  If you need to take an airplane, talk to your doctor first. Activity  Rest often.  Avoid exercises that make your heart beat much faster, such as jogging. General instructions  Do not use products that have nicotine or tobacco, such as cigarettes and e-cigarettes. If you need help quitting, ask your doctor.  Consider wearing a medical alert bracelet.  Avoid being in high places (high altitudes), such as mountains.  Avoid very hot or cold temperatures.  Avoid places where the  temperature changes a lot.  Keep all follow-up visits as told by your doctor. This is important. Contact a doctor if:  A joint hurts.  Your feet or hands hurt or swell.  You feel tired (fatigued). Get help right away if:  You have symptoms of infection. These include: ? Fever. ? Chills. ? Being very tired. ? Irritability. ? Poor eating. ? Throwing up (vomiting).  You feel dizzy or faint.  You have new stomach pain, especially on the left side.  You have a an erection (priapism) that lasts more than 4 hours.  You have numbness in your arms or legs.  You have a hard time moving your arms or legs.  You have trouble talking.  You have pain that does not go away when you take medicine.  You are short of breath.  You are breathing fast.  You have a long-term cough.  You have pain in your chest.  You have a bad headache.  You have a stiff neck.  Your stomach looks bloated even though you did not eat much.  Your skin is pale.  You suddenly cannot see well. Summary  Sickle cell anemia is a condition where your red blood cells are shaped like sickles.  Follow your doctor's advice on ways to manage pain, food to eat, activities to do, and steps to take for safe travel.  Get medical help right away if you have any signs of infection, such as a fever. This information is not intended to replace advice given to you by   your health care provider. Make sure you discuss any questions you have with your health care provider. Document Revised: 05/25/2018 Document Reviewed: 03/08/2016 Elsevier Patient Education  2020 Elsevier Inc.  

## 2019-11-26 NOTE — Discharge Summary (Signed)
[facility] Discharge Summary   Patient ID: Timothy Marks MRN: [id number] DOB/AGE: 13-Sep-1990 29 y.o.  Admit date: 11/26/2019 Discharge date: 11/26/2019  Primary Care Physician:  Vevelyn Francois, NP  Admission Diagnoses:  Active Problems:   Sickle cell pain crisis [facility])   Discharge Medications:  Allergies as of 11/26/2019      Reactions   Buprenorphine Hcl Hives   Levaquin [levofloxacin] Itching   Meperidine Rash   Morphine Hives   Morphine And Related Hives   Toradol [ketorolac Tromethamine] Itching   Tramadol Hives   Vancomycin Itching   Zosyn [piperacillin Sod-tazobactam So] Itching, Rash, Other (See Comments)   Has taken rocephin in the past      Medication List    TAKE these medications   acetaminophen 500 MG tablet Commonly known as: TYLENOL Take 500-1,000 mg by mouth every 6 (six) hours as needed for mild pain or headache.   cholecalciferol 25 MCG (1000 UNIT) tablet Commonly known as: VITAMIN D Take 2 tablets (2,000 Units total) by mouth daily.   Ensure Active High Protein Liqd Take 1 each by mouth 3 (three) times daily between meals. #90 cans of Ensure What changed: additional instructions   folic acid 1 MG tablet Commonly known as: FOLVITE Take 1 tablet (1 mg total) by mouth daily.   gabapentin 300 MG capsule Commonly known as: NEURONTIN Take 1 capsule (300 mg total) by mouth 3 (three) times daily.   hydroxyurea 500 MG capsule Commonly known as: HYDREA Take 1 capsule (500 mg total) by mouth 2 (two) times daily. May take with food to minimize GI side effects. What changed: additional instructions   oxyCODONE-acetaminophen 10-325 MG tablet Commonly known as: Percocet Take 1 tablet by mouth every 6 (six) hours as needed for up to 15 days for pain.   OxyCONTIN 20 mg 12 hr tablet Generic drug: oxyCODONE Take 1 tablet (20 mg total) by mouth every 12 (twelve) hours.   rivaroxaban 20 MG Tabs tablet Commonly known as: XARELTO Take 1  tablet (20 mg total) by mouth daily with supper.        Consults:  None  Significant Diagnostic Studies:  No results found.  History of present illness:  Timothy Marks is a 29 year old male with a medical history significant for sickle cell disease type SS, chronic pain syndrome, history of DVT on Xarelto, opiate dependence and tolerance, and history of anemia of chronic disease presents complaining of low back and lower extremity pain that is consistent with his previous sickle cell crisis.  Patient says that pain intensity increased several days ago and has been unrelieved by home medications.  He last had OxyContin around 1 AM without sustained relief.  Patient rates pain at 9/10 characterized as constant and throbbing.  He denies any headache, persistent cough, fever, chills, urinary symptoms, nausea, vomiting, or diarrhea.  No sick contacts, recent travel, or exposure to [facility] Course: Patient admitted to sickle cell day infusion center for pain management and extended observation. Hemoglobin is 9.6, consistent with patient's baseline.  There is no clinical indication for blood transfusion at this time.  All other laboratory values are unremarkable. Pain managed with IV Dilaudid via PCA with settings of 0.5 mg, 10-minute lockout, and 2 mg/h Toradol 15 mg IV x1 Tylenol 1000 mg x 1 IV fluids, D5 0.45% saline at 100 mL/h Pain intensity decreased to 5/10.  He does not warrant inpatient admission on today. Patient advised to follow-up with PCP for  medication management Patient is alert, oriented, and ambulating without assistance.  He will discharge home in a hemodynamically stable condition.  Discharge instructions: Resume all home medications.   Follow up with PCP as previously  scheduled.   Discussed the importance of drinking 64 ounces of water daily, dehydration of red blood cells may lead further sickling.   Avoid all stressors that precipitate  sickle cell pain crisis.     The patient was given clear instructions to go to ER or return to medical center if symptoms do not improve, worsen or new problems develop.     Physical Exam at Discharge:  BP (!) 109/52 (BP Location: Right Arm)   Pulse 73   Temp 98.1 F (36.7 C) (Temporal)   Resp 10   SpO2 100%   Physical Exam Constitutional:      Appearance: Normal appearance.  HENT:     Mouth/Throat:     Mouth: Mucous membranes are moist.  Eyes:     Pupils: Pupils are equal, round, and reactive to light.  Cardiovascular:     Rate and Rhythm: Normal rate and regular rhythm.     Pulses: Normal pulses.  Abdominal:     General: Abdomen is flat. Bowel sounds are normal.  Skin:    General: Skin is warm.  Neurological:     General: No focal deficit present.     Mental Status: He is alert. Mental status is at baseline.  Psychiatric:        Mood and Affect: Mood normal.        Thought Content: Thought content normal.        Judgment: Judgment normal.      Disposition at Discharge: Discharge disposition: 01-Home or Self Care       Discharge Orders: Discharge Instructions    Discharge patient   Complete by: As directed    Discharge disposition: 01-Home or Self Care   Discharge patient date: 11/26/2019      Condition at Discharge:   Stable  Time spent on Discharge:  Greater than 30 minutes.  Signed: [name]  APRN, MSN, FNP-C Patient [facility] [address] [place], [place] [address] [phone]  11/26/2019, 3:23 PM

## 2019-11-26 NOTE — H&P (Signed)
[facility] History and Physical   Date: 11/26/2019  Patient name: Timothy Marks Medical record number: [id number] Date of birth: Jul 19, 1990 Age: 29 y.o. Gender: male PCP: Vevelyn Francois, NP  Attending physician: Tresa Garter, MD  Chief Complaint: Sickle cell pain  History of Present Illness: Timothy Marks is a 29 year old male with a medical history significant for sickle cell disease type SS, chronic pain syndrome, history of DVT on Xarelto, opiate dependence and tolerance, and history of anemia of chronic disease presents complaining of low back and lower extremity pain that is consistent with his previous sickle cell crisis.  Patient says that pain intensity increased several days ago and has been unrelieved by home medications.  He last had OxyContin around 1 AM without sustained relief.  Patient rates pain at 9/10 characterized as constant and throbbing.  He denies any headache, persistent cough, fever, chills, urinary symptoms, nausea, vomiting, or diarrhea.  No sick contacts, recent travel, or exposure to COVID-19   Meds: Medications Prior to Admission  Medication Sig Dispense Refill Last Dose  . acetaminophen (TYLENOL) 500 MG tablet Take 500-1,000 mg by mouth every 6 (six) hours as needed for mild pain or headache.     . cholecalciferol (VITAMIN D) 1000 units tablet Take 2 tablets (2,000 Units total) by mouth daily. 30 tablet 6   . folic acid (FOLVITE) 1 MG tablet Take 1 tablet (1 mg total) by mouth daily. 90 tablet 2   . gabapentin (NEURONTIN) 300 MG capsule Take 1 capsule (300 mg total) by mouth 3 (three) times daily. 90 capsule 5   . hydroxyurea (HYDREA) 500 MG capsule Take 1 capsule (500 mg total) by mouth 2 (two) times daily. May take with food to minimize GI side effects. (Patient taking differently: Take 500 mg by mouth 2 (two) times daily. ) 180 capsule 2   . Nutritional Supplements (ENSURE ACTIVE HIGH PROTEIN) LIQD Take 1 each by mouth 3 (three)  times daily between meals. #90 cans of Ensure (Patient taking differently: Take 1 each by mouth 3 (three) times daily between meals. ) 237 mL 11   . oxyCODONE-acetaminophen (PERCOCET) 10-325 MG tablet Take 1 tablet by mouth every 6 (six) hours as needed for up to 15 days for pain. 60 tablet 0   . OXYCONTIN 20 MG 12 hr tablet Take 1 tablet (20 mg total) by mouth every 12 (twelve) hours. 60 tablet 0   . rivaroxaban (XARELTO) 20 MG TABS tablet Take 1 tablet (20 mg total) by mouth daily with supper. 68 tablet 0     Allergies: Buprenorphine hcl, Levaquin [levofloxacin], Meperidine, Morphine, Morphine and related, Toradol [ketorolac tromethamine], Tramadol, Vancomycin, and Zosyn [piperacillin sod-tazobactam so] Past Medical History:  Diagnosis Date  . Depression   . Generalized weakness 03/31/2019  . 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 Sickle cell anemia (HCC)   . Vitamin D deficiency y-6   Past Surgical History:  Procedure Laterality Date  . CHOLECYSTECTOMY     Family History  Problem Relation Age of Onset  . Sickle cell trait Mother   . Sickle cell trait Father    Social History   Socioeconomic History  . Marital status: Single    Spouse name: Not on file  . Number of children: Not on file  . Years of education: Not on file  . Highest education level: Not on file  Occupational History  . Not on file  Tobacco Use  . Smoking status: Never Smoker  . Smokeless tobacco: Never Used  Vaping Use  . Vaping Use: Never used  Substance and Sexual Activity  . Alcohol use: No  . Drug use: Not Currently    Types: Marijuana  . Sexual activity: Not on file  Other Topics Concern  . Not on file  Social History Narrative  . Not on file   Social Determinants of Health   Financial Resource Strain:   . Difficulty of Paying Living Expenses: Not on file  Food Insecurity:   . Worried About Charity fundraiser in the Last Year:  Not on file  . Ran Out of Food in the Last Year: Not on file  Transportation Needs:   . Lack of Transportation (Medical): Not on file  . Lack of Transportation (Non-Medical): Not on file  Physical Activity:   . Days of Exercise per Week: Not on file  . Minutes of Exercise per Session: Not on file  Stress:   . Feeling of Stress : Not on file  Social Connections:   . Frequency of Communication with Friends and Family: Not on file  . Frequency of Social Gatherings with Friends and Family: Not on file  . Attends Religious Services: Not on file  . Active Member of Clubs or Organizations: Not on file  . Attends Archivist Meetings: Not on file  . Marital Status: Not on file  Intimate Partner Violence:   . Fear of Current or Ex-Partner: Not on file  . Emotionally Abused: Not on file  . Physically Abused: Not on file  . Sexually Abused: Not on file   Review of Systems  Constitutional: Negative for chills and fever.  HENT: Negative.   Eyes: Negative.   Respiratory: Negative.   Cardiovascular: Negative.   Gastrointestinal: Negative.   Genitourinary: Negative.   Musculoskeletal: Positive for back pain and joint pain.  Skin: Negative.   Neurological: Negative.   Psychiatric/Behavioral: Negative.     Physical Exam: There were no vitals taken for this visit. Physical Exam Constitutional:      Appearance: Normal appearance.  Eyes:     Pupils: Pupils are equal, round, and reactive to light.  Cardiovascular:     Rate and Rhythm: Normal rate and regular rhythm.     Pulses: Normal pulses.  Pulmonary:     Effort: Pulmonary effort is normal.  Abdominal:     General: Abdomen is flat. Bowel sounds are normal.  Neurological:     General: No focal deficit present.     Mental Status: He is alert. Mental status is at baseline.  Psychiatric:        Mood and Affect: Mood normal.        Behavior: Behavior normal.        Thought Content: Thought content normal.        Judgment:  Judgment normal.      Lab results: No results found for this or any previous visit (from the past 24 hour(s)).  Imaging results:  No results found.   Assessment & Plan: Patient admitted to sickle cell day infusion center for management of pain crisis.  Patient is opiate tolerant Initiate IV dilaudid PCA. Settings of 0.5 mg, 10 minute lockout, and 2 mg/hr IV fluids, D5.45% saline at 100 ml/hr Toradol 15 mg IV times one dose Tylenol 1000 mg by mouth times one dose Review CBC with differential, complete metabolic panel, and reticulocytes as results become available. Baseline hemoglobin is  9-10 Pain intensity will be reevaluated in context of functioning and relationship to baseline as care progress If pain intensity remains elevated and/or sudden change in hemodynamic stability transition to inpatient services for higher level of care.    [name]  APRN, MSN, FNP-C Patient [facility] [address] [place], [place] [address] [phone]  11/26/2019, 8:47 AM

## 2019-11-27 ENCOUNTER — Other Ambulatory Visit: Payer: Self-pay | Admitting: Nurse Practitioner

## 2019-11-27 DIAGNOSIS — G894 Chronic pain syndrome: Secondary | ICD-10-CM

## 2019-11-27 DIAGNOSIS — D571 Sickle-cell disease without crisis: Secondary | ICD-10-CM

## 2019-11-27 MED ORDER — OXYCODONE-ACETAMINOPHEN 10-325 MG PO TABS: 1.0000 | ORAL_TABLET | Freq: Four times a day (QID) | ORAL | 0 refills | Status: DC | PRN

## 2019-11-27 NOTE — Telephone Encounter (Signed)
sent 

## 2019-11-27 NOTE — Telephone Encounter (Signed)
Sent to provider 

## 2019-12-02 ENCOUNTER — Telehealth (HOSPITAL_COMMUNITY): Payer: Self-pay | Admitting: General Practice

## 2019-12-02 NOTE — Telephone Encounter (Signed)
Patient called, requesting to come to the day hospital due to pain in the back rated at 10/10. Denied chest pain, fever, diarrhea, abdominal pain, nausea/vomitting and priapism. Screened negative for Covid-19 symptoms. Admitted to having means of transportation without driving self after treatment. Last took 10 mg of percocet at 01:00 am today. Per provider, should continue to take prescribed pain medications. Patient also asked to take Ibuprofen 800 mg every 8 hours, take tylenol 1000 mg every 6 hours and stay hydrated with 64 ounces of water. Can call back tomorrow if the pain does not subside. Patient can also go the ER if necessary. Patient notified, verbalized understanding.

## 2019-12-06 ENCOUNTER — Other Ambulatory Visit: Payer: Self-pay

## 2019-12-06 ENCOUNTER — Encounter (HOSPITAL_COMMUNITY): Payer: Self-pay | Admitting: Emergency Medicine

## 2019-12-06 ENCOUNTER — Emergency Department (HOSPITAL_COMMUNITY)
Admission: EM | Admit: 2019-12-06 | Discharge: 2019-12-07 | Disposition: A | Discharge status: Home / Self Care | Payer: Medicaid Other | Attending: Emergency Medicine | Admitting: Emergency Medicine

## 2019-12-06 DIAGNOSIS — D57219 Sickle-cell/Hb-C disease with crisis, unspecified: Secondary | ICD-10-CM | POA: Diagnosis not present

## 2019-12-06 DIAGNOSIS — D5700 Hb-SS disease with crisis, unspecified: Secondary | ICD-10-CM

## 2019-12-06 DIAGNOSIS — M549 Dorsalgia, unspecified: Secondary | ICD-10-CM | POA: Diagnosis present

## 2019-12-06 LAB — CBC WITH DIFFERENTIAL/PLATELET
Abs Immature Granulocytes: 0.09 10*3/uL — ABNORMAL HIGH (ref 0.00–0.07)
Basophils Absolute: 0.1 10*3/uL (ref 0.0–0.1)
Basophils Relative: 0 %
Eosinophils Absolute: 0.4 10*3/uL (ref 0.0–0.5)
Eosinophils Relative: 3 %
HCT: 30 % — ABNORMAL LOW (ref 39.0–52.0)
Hemoglobin: 10.4 g/dL — ABNORMAL LOW (ref 13.0–17.0)
Immature Granulocytes: 1 %
Lymphocytes Relative: 40 %
Lymphs Abs: 5.4 10*3/uL — ABNORMAL HIGH (ref 0.7–4.0)
MCH: 30.1 pg (ref 26.0–34.0)
MCHC: 34.7 g/dL (ref 30.0–36.0)
MCV: 86.7 fL (ref 80.0–100.0)
Monocytes Absolute: 1.1 10*3/uL — ABNORMAL HIGH (ref 0.1–1.0)
Monocytes Relative: 8 %
Neutro Abs: 6.4 10*3/uL (ref 1.7–7.7)
Neutrophils Relative %: 48 %
Platelets: 544 10*3/uL — ABNORMAL HIGH (ref 150–400)
RBC: 3.46 MIL/uL — ABNORMAL LOW (ref 4.22–5.81)
RDW: 19.8 % — ABNORMAL HIGH (ref 11.5–15.5)
WBC: 13.5 10*3/uL — ABNORMAL HIGH (ref 4.0–10.5)
nRBC: 1.8 % — ABNORMAL HIGH (ref 0.0–0.2)

## 2019-12-06 NOTE — ED Triage Notes (Signed)
Pt c/o sickle cell pain st's he ran out of his pain meds 1 day ago

## 2019-12-07 LAB — COMPREHENSIVE METABOLIC PANEL
ALT: 13 U/L (ref 0–44)
AST: 31 U/L (ref 15–41)
Albumin: 5.2 g/dL — ABNORMAL HIGH (ref 3.5–5.0)
Alkaline Phosphatase: 75 U/L (ref 38–126)
Anion gap: 14 (ref 5–15)
BUN: 6 mg/dL (ref 6–20)
CO2: 23 mmol/L (ref 22–32)
Calcium: 9.7 mg/dL (ref 8.9–10.3)
Chloride: 103 mmol/L (ref 98–111)
Creatinine, Ser: 0.65 mg/dL (ref 0.61–1.24)
GFR, Estimated: >60 mL/min (ref >60)
Glucose, Bld: 86 mg/dL (ref 70–99)
Potassium: 2.9 mmol/L — ABNORMAL LOW (ref 3.5–5.1)
Sodium: 140 mmol/L (ref 135–145)
Total Bilirubin: 5.9 mg/dL — ABNORMAL HIGH (ref 0.3–1.2)
Total Protein: 8.8 g/dL — ABNORMAL HIGH (ref 6.5–8.1)

## 2019-12-07 LAB — RETICULOCYTES
Immature Retic Fract: 38 % — ABNORMAL HIGH (ref 2.3–15.9)
RBC.: 3.47 MIL/uL — ABNORMAL LOW (ref 4.22–5.81)
Retic Count, Absolute: 373.5 10*3/uL — ABNORMAL HIGH (ref 19.0–186.0)
Retic Ct Pct: 10.2 % — ABNORMAL HIGH (ref 0.4–3.1)

## 2019-12-07 MED ORDER — SODIUM CHLORIDE 0.9 % IV BOLUS
1000.0000 mL | Freq: Once | INTRAVENOUS | Status: AC
  Administered 2019-12-07: 1000 mL via INTRAVENOUS

## 2019-12-07 MED ORDER — HYDROMORPHONE HCL 1 MG/ML IJ SOLN
2.0000 mg | Freq: Once | INTRAMUSCULAR | Status: AC
  Administered 2019-12-07: 2 mg via INTRAVENOUS
  Filled 2019-12-07: qty 2

## 2019-12-07 MED ORDER — DIPHENHYDRAMINE HCL 50 MG/ML IJ SOLN
25.0000 mg | Freq: Once | INTRAMUSCULAR | Status: AC
  Administered 2019-12-07: 25 mg via INTRAVENOUS
  Filled 2019-12-07: qty 1

## 2019-12-07 MED ORDER — KETOROLAC TROMETHAMINE 30 MG/ML IJ SOLN
30.0000 mg | Freq: Once | INTRAMUSCULAR | Status: AC
  Administered 2019-12-07: 30 mg via INTRAVENOUS
  Filled 2019-12-07: qty 1

## 2019-12-07 NOTE — ED Provider Notes (Signed)
[facility] EMERGENCY DEPARTMENT Provider Note   CSN: [id number] Arrival date & time: 12/06/19  2310     History Chief Complaint  Patient presents with  . Sickle Cell Pain Crisis    Timothy Marks is a 29 y.o. male.  Patient is a 29 year old male with history of sickle cell disease presenting with complaints of back pain.  He describes pain to his low back with no radiation, weakness, or numbness.  There are no bowel or bladder complaints.  This pain is consistent with his prior sickle cell crises.  He denies any chest pain or difficulty breathing.  He denies any fevers.  The history is provided by the patient.  Sickle Cell Pain Crisis Location:  Back Severity:  Severe Onset quality:  Gradual Duration:  2 days Timing:  Constant Progression:  Worsening Chronicity:  Recurrent Relieved by:  Nothing Worsened by:  Movement and activity Ineffective treatments:  None tried      Past Medical History:  Diagnosis Date  . Depression   . Generalized weakness 03/31/2019  . 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 Sickle cell anemia (HCC)   . Vitamin D deficiency y-6    Patient Active Problem List   Diagnosis Date Noted  . Underweight 06/14/2019  . Generalized weakness 03/31/2019  . Sickle-cell crisis ([place]) 03/24/2019  . SIRS (systemic inflammatory response syndrome) ([place]) 02/10/2019  . Abnormal liver function 11/04/2018  . Sickle cell anemia with crisis ([place]) 05/22/2018  . Narcotic dependence ([place]) 02/10/2018  . Acute bilateral low back pain without sciatica   . Other chronic pain   . Anemia   . Socially inappropriate behavior 12/02/2017  . Thrombocythemia   . Physical deconditioning   . Agitation   . Chronic pain syndrome   . Recurrent cold sores   . Fever   . Drug-seeking behavior 08/30/2017  . Pain   . Sickle cell crisis ([place]) 07/16/2017  . Adjustment disorder with mixed disturbance of  emotions and conduct   . Bilateral pulmonary infiltrates on chest x-ray 11/21/2016  . History of DVT (deep vein thrombosis) 11/17/2016  . Suicidal ideations 10/11/2016  . Reticulocytosis 10/09/2016  . DVT (deep venous thrombosis) ([place]) 08/02/2016  . Has difficulty accessing primary care provider for most visits 07/11/2015  . 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 Hordeolum externum of left upper eyelid 05/31/2015  . Major depressive disorder with current active episode 11/09/2014  . Hb-SS disease with acute chest syndrome ([place]) 11/08/2014  . Pneumonia of right upper lobe due to infectious organism 11/07/2014  . Constipation 08/03/2014  . Leukocytosis 06/30/2014  . 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 Protein-calorie malnutrition, severe ([place]) 04/09/2014  . Major depressive disorder, recurrent, severe without psychotic features ([place])   . 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 Vitamin D deficiency   . Sickle cell anemia ([place]) 03/30/2014  . Hyperbilirubinemia 03/30/2014  . Hypokalemia 02/07/2014  . Mandible fracture ([place]) 05/03/2013  . Aggressive behavior 01/25/2013  . 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 Sickle cell anemia with pain ([place]) 01/24/2013  . Sickle cell pain crisis ([place]) 01/24/2013  . 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 CHOLECYSTECTOMY         Family History  Problem Relation Age  of Onset  . Sickle cell trait Mother   . Sickle cell trait Father     Social History   Tobacco Use  . Smoking status: Never Smoker  . Smokeless tobacco: Never Used  Vaping Use  . Vaping Use: Never used  Substance Use Topics  . Alcohol use: No  . Drug use: Not Currently    Types: Marijuana    Home Medications Prior to Admission medications     Medication Sig Start Date End Date Taking? Authorizing Provider  acetaminophen (TYLENOL) 500 MG tablet Take 500-1,000 mg by mouth every 6 (six) hours as needed for mild pain or headache.    [provider]  cholecalciferol (VITAMIN D) 1000 units tablet Take 2 tablets (2,000 Units total) by mouth daily. 12/13/17   Azzie Glatter, FNP  folic acid (FOLVITE) 1 MG tablet Take 1 tablet (1 mg total) by mouth daily. 05/01/17   Dorena Dew, FNP  gabapentin (NEURONTIN) 300 MG capsule Take 1 capsule (300 mg total) by mouth 3 (three) times daily. 03/12/19   Dorena Dew, FNP  hydroxyurea (HYDREA) 500 MG capsule Take 1 capsule (500 mg total) by mouth 2 (two) times daily. May take with food to minimize GI side effects. Patient taking differently: Take 500 mg by mouth 2 (two) times daily.  05/01/17   Dorena Dew, FNP  Nutritional Supplements (ENSURE ACTIVE HIGH PROTEIN) LIQD Take 1 each by mouth 3 (three) times daily between meals. #90 cans of Ensure Patient taking differently: Take 1 each by mouth 3 (three) times daily between meals.  11/13/18   Dorena Dew, FNP  oxyCODONE-acetaminophen (PERCOCET) 10-325 MG tablet Take 1 tablet by mouth every 6 (six) hours as needed for up to 15 days for pain. 11/27/19 12/12/19  Vevelyn Francois, NP  OXYCONTIN 20 MG 12 hr tablet Take 1 tablet (20 mg total) by mouth every 12 (twelve) hours. 11/17/19 12/17/19  Vevelyn Francois, NP  rivaroxaban (XARELTO) 20 MG TABS tablet Take 1 tablet (20 mg total) by mouth daily with supper. 10/29/19 01/05/20  Vevelyn Francois, NP    Allergies    Buprenorphine hcl, Levaquin [levofloxacin], Meperidine, Morphine, Morphine and related, Toradol [ketorolac tromethamine], Tramadol, Vancomycin, and Zosyn [piperacillin sod-tazobactam so]  Review of Systems   Review of Systems  All other systems reviewed and are negative.   Physical Exam Updated Vital Signs BP 117/66 (BP Location: Left Arm)   Pulse 86   Temp 98.4 F  (36.9 C) (Oral)   Resp 18   SpO2 100%   Physical Exam Vitals and nursing note reviewed.  Constitutional:      General: He is not in acute distress.    Appearance: He is well-developed. He is not diaphoretic.  HENT:     Head: Normocephalic and atraumatic.  Cardiovascular:     Rate and Rhythm: Normal rate and regular rhythm.     Heart sounds: No murmur heard.  No friction rub.  Pulmonary:     Effort: Pulmonary effort is normal. No respiratory distress.     Breath sounds: Normal breath sounds. No wheezing or rales.  Abdominal:     General: Bowel sounds are normal. There is no distension.     Palpations: Abdomen is soft.     Tenderness: There is no abdominal tenderness.  Musculoskeletal:        General: Normal range of motion.     Cervical back: Normal range of motion and neck supple.  Skin:  General: Skin is warm and dry.  Neurological:     Mental Status: He is alert and oriented to person, place, and time.     Coordination: Coordination normal.     ED Results / Procedures / Treatments   Labs (all labs ordered are listed, but only abnormal results are displayed) Labs Reviewed  COMPREHENSIVE METABOLIC PANEL - Abnormal; Notable for the following components:      Result Value   Potassium 2.9 (*)    Total Protein 8.8 (*)    Albumin 5.2 (*)    Total Bilirubin 5.9 (*)    All other components within normal limits  CBC WITH DIFFERENTIAL/PLATELET - Abnormal; Notable for the following components:   WBC 13.5 (*)    RBC 3.46 (*)    Hemoglobin 10.4 (*)    HCT 30.0 (*)    RDW 19.8 (*)    Platelets 544 (*)    nRBC 1.8 (*)    Lymphs Abs 5.4 (*)    Monocytes Absolute 1.1 (*)    Abs Immature Granulocytes 0.09 (*)    All other components within normal limits  RETICULOCYTES - Abnormal; Notable for the following components:   Retic Ct Pct 10.2 (*)    RBC. 3.47 (*)    Retic Count, Absolute 373.5 (*)    Immature Retic Fract 38.0 (*)    All other components within normal limits     EKG None  Radiology No results found.  Procedures Procedures (including critical care time)  Medications Ordered in ED Medications  sodium chloride 0.9 % bolus 1,000 mL (has no administration in time range)  HYDROmorphone (DILAUDID) injection 2 mg (has no administration in time range)  ketorolac (TORADOL) 30 MG/ML injection 30 mg (has no administration in time range)  diphenhydrAMINE (BENADRYL) injection 25 mg (has no administration in time range)    ED Course  I have reviewed the triage vital signs and the nursing notes.  Pertinent labs & imaging results that were available during my care of the patient were reviewed by me and considered in my medical decision making (see chart for details).    MDM Rules/Calculators/A&P  Patient with history of sickle cell disease presenting with complaints of back pain consistent with prior sickle cell crises.  His laboratory studies are reassuring and vital signs are stable.  He is afebrile with no chest pain.  Patient given IV fluids and pain medication and is feeling better.  He will be discharged with follow-up with his hematologist and as needed return.  Final Clinical Impression(s) / ED Diagnoses Final diagnoses:  None    Rx / DC Orders ED Discharge Orders    None       Veryl Speak, MD 12/07/19 [phone]

## 2019-12-07 NOTE — ED Notes (Signed)
Pt paged desk to state "I feel a lot better with my pain. I want my discharge papers so I can go home." Notified MD of patient's request.

## 2019-12-07 NOTE — Discharge Instructions (Addendum)
Follow-up with your hematologist next week, and return to the ER if you develop severe chest pain, difficulty breathing, high fever, or other new and concerning symptoms.

## 2019-12-09 ENCOUNTER — Telehealth: Payer: Self-pay | Admitting: Nurse Practitioner

## 2019-12-09 NOTE — Telephone Encounter (Signed)
error 

## 2019-12-11 ENCOUNTER — Other Ambulatory Visit: Payer: Self-pay | Admitting: Nurse Practitioner

## 2019-12-11 DIAGNOSIS — D571 Sickle-cell disease without crisis: Secondary | ICD-10-CM

## 2019-12-11 DIAGNOSIS — G894 Chronic pain syndrome: Secondary | ICD-10-CM

## 2019-12-11 MED ORDER — OXYCODONE-ACETAMINOPHEN 10-325 MG PO TABS: 1.0000 | ORAL_TABLET | Freq: Four times a day (QID) | ORAL | 0 refills | Status: DC | PRN

## 2019-12-11 NOTE — Telephone Encounter (Signed)
sent 

## 2019-12-12 ENCOUNTER — Telehealth: Payer: Self-pay | Admitting: Nurse Practitioner

## 2019-12-13 NOTE — Telephone Encounter (Signed)
Sent to provider 

## 2019-12-15 ENCOUNTER — Emergency Department (HOSPITAL_COMMUNITY): Payer: Medicaid Other

## 2019-12-15 ENCOUNTER — Emergency Department (HOSPITAL_COMMUNITY)
Admission: EM | Admit: 2019-12-15 | Discharge: 2019-12-15 | Disposition: A | Discharge status: Home / Self Care | Payer: Medicaid Other | Attending: Emergency Medicine | Admitting: Emergency Medicine

## 2019-12-15 ENCOUNTER — Other Ambulatory Visit: Payer: Self-pay

## 2019-12-15 ENCOUNTER — Encounter (HOSPITAL_COMMUNITY): Payer: Self-pay | Admitting: Emergency Medicine

## 2019-12-15 DIAGNOSIS — M545 Low back pain, unspecified: Secondary | ICD-10-CM | POA: Diagnosis present

## 2019-12-15 DIAGNOSIS — Z7901 Long term (current) use of anticoagulants: Secondary | ICD-10-CM | POA: Diagnosis not present

## 2019-12-15 DIAGNOSIS — D5700 Hb-SS disease with crisis, unspecified: Secondary | ICD-10-CM | POA: Insufficient documentation

## 2019-12-15 LAB — COMPREHENSIVE METABOLIC PANEL
ALT: 9 U/L (ref 0–44)
AST: 45 U/L — ABNORMAL HIGH (ref 15–41)
Albumin: 4.2 g/dL (ref 3.5–5.0)
Alkaline Phosphatase: 64 U/L (ref 38–126)
Anion gap: 11 (ref 5–15)
BUN: <5 mg/dL — ABNORMAL LOW (ref 6–20)
CO2: 24 mmol/L (ref 22–32)
Calcium: 8.9 mg/dL (ref 8.9–10.3)
Chloride: 103 mmol/L (ref 98–111)
Creatinine, Ser: 0.63 mg/dL (ref 0.61–1.24)
GFR, Estimated: >60 mL/min (ref >60)
Glucose, Bld: 98 mg/dL (ref 70–99)
Potassium: 4 mmol/L (ref 3.5–5.1)
Sodium: 138 mmol/L (ref 135–145)
Total Bilirubin: 3.7 mg/dL — ABNORMAL HIGH (ref 0.3–1.2)
Total Protein: 7.3 g/dL (ref 6.5–8.1)

## 2019-12-15 LAB — CBC WITH DIFFERENTIAL/PLATELET
Abs Immature Granulocytes: 0.06 10*3/uL (ref 0.00–0.07)
Basophils Absolute: 0.1 10*3/uL (ref 0.0–0.1)
Basophils Relative: 0 %
Eosinophils Absolute: 0.5 10*3/uL (ref 0.0–0.5)
Eosinophils Relative: 4 %
HCT: 30.7 % — ABNORMAL LOW (ref 39.0–52.0)
Hemoglobin: 10.8 g/dL — ABNORMAL LOW (ref 13.0–17.0)
Immature Granulocytes: 1 %
Lymphocytes Relative: 42 %
Lymphs Abs: 5.3 10*3/uL — ABNORMAL HIGH (ref 0.7–4.0)
MCH: 31.5 pg (ref 26.0–34.0)
MCHC: 35.2 g/dL (ref 30.0–36.0)
MCV: 89.5 fL (ref 80.0–100.0)
Monocytes Absolute: 1.2 10*3/uL — ABNORMAL HIGH (ref 0.1–1.0)
Monocytes Relative: 10 %
Neutro Abs: 5.3 10*3/uL (ref 1.7–7.7)
Neutrophils Relative %: 43 %
Platelets: 284 10*3/uL (ref 150–400)
RBC: 3.43 MIL/uL — ABNORMAL LOW (ref 4.22–5.81)
RDW: 19 % — ABNORMAL HIGH (ref 11.5–15.5)
WBC: 12.5 10*3/uL — ABNORMAL HIGH (ref 4.0–10.5)
nRBC: 3.4 % — ABNORMAL HIGH (ref 0.0–0.2)

## 2019-12-15 LAB — RETICULOCYTES
Immature Retic Fract: 43.7 % — ABNORMAL HIGH (ref 2.3–15.9)
RBC.: 3.43 MIL/uL — ABNORMAL LOW (ref 4.22–5.81)
Retic Count, Absolute: 449 10*3/uL — ABNORMAL HIGH (ref 19.0–186.0)
Retic Ct Pct: 13.1 % — ABNORMAL HIGH (ref 0.4–3.1)

## 2019-12-15 LAB — TROPONIN I (HIGH SENSITIVITY): Troponin I (High Sensitivity): 12 ng/L (ref <18)

## 2019-12-15 MED ORDER — HYDROMORPHONE HCL 1 MG/ML IJ SOLN
2.0000 mg | INTRAMUSCULAR | Status: AC
  Administered 2019-12-15: 2 mg via INTRAVENOUS

## 2019-12-15 MED ORDER — HYDROMORPHONE HCL 1 MG/ML IJ SOLN
2.0000 mg | INTRAMUSCULAR | Status: AC
  Administered 2019-12-15: 2 mg via INTRAVENOUS
  Filled 2019-12-15 (×2): qty 2

## 2019-12-15 NOTE — ED Notes (Signed)
Pt refusing to have another IV states he will go to the clinic.

## 2019-12-15 NOTE — ED Notes (Signed)
Pt not cooperative during assessment getting rude with this RN and refuses to have IV done by this RN, IV team consulted for IV placement.

## 2019-12-15 NOTE — Discharge Instructions (Addendum)
Follow up at the sickle cell day clinic regarding your pain Return to the ED for any worsening symptoms

## 2019-12-15 NOTE — ED Notes (Signed)
Pt refusing to be on the monitor.

## 2019-12-15 NOTE — ED Triage Notes (Signed)
Pt c/o sickle cell pain x 2 days. Pain worse in chest and back.

## 2019-12-15 NOTE — ED Provider Notes (Signed)
[facility] EMERGENCY DEPARTMENT Provider Note   CSN: [id number] Arrival date & time: 12/15/19  6295     History Chief Complaint  Patient presents with  . Sickle Cell Pain Crisis    Timothy Marks is a 29 y.o. male with PMHx sickle cell anemia who presents to the ED today with complaint of gradual onset, constant, achy, lower back pain and diffuse chest pain x 3 days. Pt reports he "sometimes" has chest pain with his sickle cell crisis however does admit it's not his baseline pain. He takes Percocet and Oxycontin for pain without relief. Pt is on Xarelto currently and has not missed any doses. Denies any fevers, chills, cough, shortness of breath, abdominal pain, nausea, vomiting, weakness, numbness, tingling in BLEs or any other associated symptoms.   The history is provided by the patient and medical records.       Past Medical History:  Diagnosis Date  . Depression   . Generalized weakness 03/31/2019  . 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 Sickle cell anemia (HCC)   . Vitamin D deficiency y-6    Patient Active Problem List   Diagnosis Date Noted  . Underweight 06/14/2019  . Generalized weakness 03/31/2019  . Sickle-cell crisis ([place]) 03/24/2019  . SIRS (systemic inflammatory response syndrome) ([place]) 02/10/2019  . Abnormal liver function 11/04/2018  . Sickle cell anemia with crisis ([place]) 05/22/2018  . Narcotic dependence ([place]) 02/10/2018  . Acute bilateral low back pain without sciatica   . Other chronic pain   . Anemia   . Socially inappropriate behavior 12/02/2017  . Thrombocythemia   . Physical deconditioning   . Agitation   . Chronic pain syndrome   . Recurrent cold sores   . Fever   . Drug-seeking behavior 08/30/2017  . Pain   . Sickle cell crisis ([place]) 07/16/2017  . Adjustment disorder with mixed disturbance of emotions and conduct   . Bilateral pulmonary infiltrates on chest  x-ray 11/21/2016  . History of DVT (deep vein thrombosis) 11/17/2016  . Suicidal ideations 10/11/2016  . Reticulocytosis 10/09/2016  . DVT (deep venous thrombosis) ([place]) 08/02/2016  . Has difficulty accessing primary care provider for most visits 07/11/2015  . 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 Hordeolum externum of left upper eyelid 05/31/2015  . Major depressive disorder with current active episode 11/09/2014  . Hb-SS disease with acute chest syndrome ([place]) 11/08/2014  . Pneumonia of right upper lobe due to infectious organism 11/07/2014  . Constipation 08/03/2014  . Leukocytosis 06/30/2014  . 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 Protein-calorie malnutrition, severe ([place]) 04/09/2014  . Major depressive disorder, recurrent, severe without psychotic features ([place])   . 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 Vitamin D deficiency   . Sickle cell anemia ([place]) 03/30/2014  . Hyperbilirubinemia 03/30/2014  . Hypokalemia 02/07/2014  . Mandible fracture ([place]) 05/03/2013  . Aggressive behavior 01/25/2013  . 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 Sickle cell anemia with pain ([place]) 01/24/2013  . Sickle cell pain crisis ([place]) 01/24/2013  . 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 Other hemoglobinopathies ([place]) 04/22/2011  . 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 CHOLECYSTECTOMY         Family History  Problem Relation Age of Onset  . Sickle cell trait Mother   . Sickle cell  trait Father     Social History   Tobacco Use  . Smoking status: Never Smoker  . Smokeless tobacco: Never Used  Vaping Use  . Vaping Use: Never used  Substance Use Topics  . Alcohol use: No  . Drug use: Not Currently    Types: Marijuana    Home Medications Prior to Admission medications   Medication Sig Start Date End Date Taking? Authorizing Provider    acetaminophen (TYLENOL) 500 MG tablet Take 500-1,000 mg by mouth every 6 (six) hours as needed for mild pain or headache.    [provider]  cholecalciferol (VITAMIN D) 1000 units tablet Take 2 tablets (2,000 Units total) by mouth daily. 12/13/17   Azzie Glatter, FNP  folic acid (FOLVITE) 1 MG tablet Take 1 tablet (1 mg total) by mouth daily. 05/01/17   Dorena Dew, FNP  gabapentin (NEURONTIN) 300 MG capsule Take 1 capsule (300 mg total) by mouth 3 (three) times daily. 03/12/19   Dorena Dew, FNP  hydroxyurea (HYDREA) 500 MG capsule Take 1 capsule (500 mg total) by mouth 2 (two) times daily. May take with food to minimize GI side effects. Patient taking differently: Take 500 mg by mouth 2 (two) times daily.  05/01/17   Dorena Dew, FNP  Nutritional Supplements (ENSURE ACTIVE HIGH PROTEIN) LIQD Take 1 each by mouth 3 (three) times daily between meals. #90 cans of Ensure Patient taking differently: Take 1 each by mouth See admin instructions. 5 times daily 11/13/18   Dorena Dew, FNP  oxyCODONE-acetaminophen (PERCOCET) 10-325 MG tablet Take 1 tablet by mouth every 6 (six) hours as needed for up to 15 days for pain. 12/12/19 12/27/19  Vevelyn Francois, NP  OXYCONTIN 20 MG 12 hr tablet Take 1 tablet (20 mg total) by mouth every 12 (twelve) hours. 11/17/19 12/17/19  Vevelyn Francois, NP  rivaroxaban (XARELTO) 20 MG TABS tablet Take 1 tablet (20 mg total) by mouth daily with supper. 10/29/19 01/05/20  Vevelyn Francois, NP    Allergies    Buprenorphine hcl, Levaquin [levofloxacin], Meperidine, Morphine, Morphine and related, Toradol [ketorolac tromethamine], Tramadol, Vancomycin, and Zosyn [piperacillin sod-tazobactam so]  Review of Systems   Review of Systems  Constitutional: Negative for chills and fever.  Respiratory: Negative for cough and shortness of breath.   Cardiovascular: Positive for chest pain.  Gastrointestinal: Negative for abdominal pain, nausea and  vomiting.  Musculoskeletal: Positive for back pain.  All other systems reviewed and are negative.   Physical Exam Updated Vital Signs BP 134/76 (BP Location: Right Arm)   Pulse 80   Temp 98.9 F (37.2 C) (Oral)   Resp 16   SpO2 100%   Physical Exam Vitals and nursing note reviewed.  Constitutional:      Appearance: He is not ill-appearing or diaphoretic.  HENT:     Head: Normocephalic and atraumatic.  Eyes:     Conjunctiva/sclera: Conjunctivae normal.  Cardiovascular:     Rate and Rhythm: Normal rate and regular rhythm.     Pulses: Normal pulses.  Pulmonary:     Effort: Pulmonary effort is normal.     Breath sounds: Normal breath sounds. No wheezing, rhonchi or rales.  Chest:     Chest wall: Tenderness present.       Comments: Diffuse chest wall TTP Abdominal:     Palpations: Abdomen is soft.     Tenderness: There is no abdominal tenderness. There is no guarding or rebound.  Musculoskeletal:  Cervical back: Neck supple.     Comments: No C, T, or L midline spinal TTP. + Bilateral paralumbar musculature TTP. ROM intact to neck and back. Strength 5/5 to BUE and BLEs. Sensation intact throughout. 2+ distal pulses bilaterally.   Skin:    General: Skin is warm and dry.  Neurological:     Mental Status: He is alert.     ED Results / Procedures / Treatments   Labs (all labs ordered are listed, but only abnormal results are displayed) Labs Reviewed  COMPREHENSIVE METABOLIC PANEL - Abnormal; Notable for the following components:      Result Value   BUN <5 (*)    AST 45 (*)    Total Bilirubin 3.7 (*)    All other components within normal limits  CBC WITH DIFFERENTIAL/PLATELET - Abnormal; Notable for the following components:   WBC 12.5 (*)    RBC 3.43 (*)    Hemoglobin 10.8 (*)    HCT 30.7 (*)    RDW 19.0 (*)    nRBC 3.4 (*)    Lymphs Abs 5.3 (*)    Monocytes Absolute 1.2 (*)    All other components within normal limits  RETICULOCYTES - Abnormal; Notable for  the following components:   Retic Ct Pct 13.1 (*)    RBC. 3.43 (*)    Retic Count, Absolute 449.0 (*)    Immature Retic Fract 43.7 (*)    All other components within normal limits  TROPONIN I (HIGH SENSITIVITY)    EKG None  Radiology DG Chest 2 View  Result Date: 12/15/2019 CLINICAL DATA:  Chest pain, sickle cell disease. EXAM: CHEST - 2 VIEW COMPARISON:  Chest radiograph dated 08/30/2019. FINDINGS: The heart size and mediastinal contours are within normal limits. Both lungs are clear. The visualized skeletal structures are unremarkable. IMPRESSION: No active cardiopulmonary disease. Electronically Signed   By: Zerita Boers M.D.   On: 12/15/2019 18:55    Procedures Procedures (including critical care time)  Medications Ordered in ED Medications  HYDROmorphone (DILAUDID) injection 2 mg (2 mg Intravenous Given 12/15/19 1901)  HYDROmorphone (DILAUDID) injection 2 mg (2 mg Intravenous Given 12/15/19 1815)    ED Course  I have reviewed the triage vital signs and the nursing notes.  Pertinent labs & imaging results that were available during my care of the patient were reviewed by me and considered in my medical decision making (see chart for details).    MDM Rules/Calculators/A&P                          29 year old male with history of sickle cell anemia presents to the ED today complaining of lower back pain and diffuse chest wall pain x3 to 4 days.  He normally has back pain with his sickle cell crisis however states that the chest pain is atypical.  No cough, shortness of breath, fevers chills.  On arrival to the ED vitals are stable.  Patient is afebrile, nontachycardic nontachypneic.  He appears to be in no acute distress.  Nursing staff he refused to get into a gown or put leads on his chest for an EKG.  On exam patient does have diffuse chest wall tenderness palpation, lungs clear to auscultation bilaterally however will need to get a chest x-ray to rule out acute chest  syndrome.  PERC was obtained prior to patient being seen.  White blood cell count elevated at 12.5 however appears decreased from lab work on 10/22  for which he presented for back pain.   WBC  Date Value Ref Range Status  12/15/2019 12.5 (H) 4.0 - 10.5 K/uL Final    Comment:    WHITE COUNT CONFIRMED ON SMEAR  12/06/2019 13.5 (H) 4.0 - 10.5 K/uL Final  11/26/2019 10.1 4.0 - 10.5 K/uL Final  11/18/2019 7.9 4.0 - 10.5 K/uL Final   CMP without electrolyte abnormalities.  T bili slightly elevated at 3.7 however improved from previous.  CXR clear. Troponin 12. Given CP has been going on for the past few days do not feel pt requires repeat troponin at this time. Will reassess after pain meds.   Pt received first dose of IV dilaudid. During his 2nd dose he told the nurse that she blew his IV. Another nurse went in to start another IV however pt relayed to her that he would like to leave and follow up at the day clinic. He is requesting discharge paperwork. Feel this is reasonable today. No medical need for admission. Instructed to return for any worsening symptoms.   This note was prepared using Dragon voice recognition software and may include unintentional dictation errors due to the inherent limitations of voice recognition software.   Final Clinical Impression(s) / ED Diagnoses Final diagnoses:  Sickle cell pain crisis ([place])    Rx / DC Orders ED Discharge Orders    None       Discharge Instructions     Follow up at the sickle cell day clinic regarding your pain Return to the ED for any worsening symptoms       Eustaquio Maize, PA-C 12/15/19 1911    Pattricia Boss, MD 12/15/19 2101

## 2019-12-16 ENCOUNTER — Telehealth (HOSPITAL_COMMUNITY): Payer: Self-pay | Admitting: General Practice

## 2019-12-16 NOTE — Telephone Encounter (Signed)
Patient called, requesting to come to the day hospital due to pain in the back rated at 10/10. Denied chest pain, fever, diarrhea, abdominal pain, nausea/vomitting and priapism. Screened negative for Covid-19 symptoms. Was at [facility] ER yesterday. Admitted to having means of transportation without driving self after treatment. Last took 10 mg of percocet at 22:00 pm yesterday. Per provider, patient should continue to take pain medications, stay hydrated with 64 ounces of water, call back to the the day hospital tomorrow or go to the ER if the pain is unbearable today. Patient notified, verbalized understanding.

## 2019-12-19 ENCOUNTER — Emergency Department (HOSPITAL_COMMUNITY)
Admission: EM | Admit: 2019-12-19 | Discharge: 2019-12-19 | Disposition: A | Discharge status: Home / Self Care | Payer: Medicaid Other | Attending: Emergency Medicine | Admitting: Emergency Medicine

## 2019-12-19 ENCOUNTER — Encounter (HOSPITAL_COMMUNITY): Payer: Self-pay | Admitting: Emergency Medicine

## 2019-12-19 ENCOUNTER — Other Ambulatory Visit: Payer: Self-pay

## 2019-12-19 ENCOUNTER — Emergency Department (HOSPITAL_COMMUNITY): Payer: Medicaid Other

## 2019-12-19 DIAGNOSIS — Z7901 Long term (current) use of anticoagulants: Secondary | ICD-10-CM | POA: Diagnosis not present

## 2019-12-19 DIAGNOSIS — D5700 Hb-SS disease with crisis, unspecified: Secondary | ICD-10-CM | POA: Diagnosis present

## 2019-12-19 DIAGNOSIS — Z79899 Other long term (current) drug therapy: Secondary | ICD-10-CM | POA: Diagnosis not present

## 2019-12-19 DIAGNOSIS — Z86718 Personal history of other venous thrombosis and embolism: Secondary | ICD-10-CM | POA: Insufficient documentation

## 2019-12-19 LAB — CBC WITH DIFFERENTIAL/PLATELET
Abs Immature Granulocytes: 0.04 10*3/uL (ref 0.00–0.07)
Basophils Absolute: 0 10*3/uL (ref 0.0–0.1)
Basophils Relative: 0 %
Eosinophils Absolute: 0.2 10*3/uL (ref 0.0–0.5)
Eosinophils Relative: 2 %
HCT: 25.6 % — ABNORMAL LOW (ref 39.0–52.0)
Hemoglobin: 8.8 g/dL — ABNORMAL LOW (ref 13.0–17.0)
Immature Granulocytes: 0 %
Lymphocytes Relative: 18 %
Lymphs Abs: 1.9 10*3/uL (ref 0.7–4.0)
MCH: 30.6 pg (ref 26.0–34.0)
MCHC: 34.4 g/dL (ref 30.0–36.0)
MCV: 88.9 fL (ref 80.0–100.0)
Monocytes Absolute: 1.2 10*3/uL — ABNORMAL HIGH (ref 0.1–1.0)
Monocytes Relative: 11 %
Neutro Abs: 7.4 10*3/uL (ref 1.7–7.7)
Neutrophils Relative %: 69 %
Platelets: 421 10*3/uL — ABNORMAL HIGH (ref 150–400)
RBC: 2.88 MIL/uL — ABNORMAL LOW (ref 4.22–5.81)
RDW: 16.9 % — ABNORMAL HIGH (ref 11.5–15.5)
WBC: 10.8 10*3/uL — ABNORMAL HIGH (ref 4.0–10.5)
nRBC: 1.3 % — ABNORMAL HIGH (ref 0.0–0.2)

## 2019-12-19 LAB — RETICULOCYTES
Immature Retic Fract: 28 % — ABNORMAL HIGH (ref 2.3–15.9)
RBC.: 2.88 MIL/uL — ABNORMAL LOW (ref 4.22–5.81)
Retic Count, Absolute: 214.6 10*3/uL — ABNORMAL HIGH (ref 19.0–186.0)
Retic Ct Pct: 7.5 % — ABNORMAL HIGH (ref 0.4–3.1)

## 2019-12-19 LAB — COMPREHENSIVE METABOLIC PANEL
ALT: 10 U/L (ref 0–44)
AST: 22 U/L (ref 15–41)
Albumin: 3.8 g/dL (ref 3.5–5.0)
Alkaline Phosphatase: 62 U/L (ref 38–126)
Anion gap: 10 (ref 5–15)
BUN: 5 mg/dL — ABNORMAL LOW (ref 6–20)
CO2: 25 mmol/L (ref 22–32)
Calcium: 8.9 mg/dL (ref 8.9–10.3)
Chloride: 104 mmol/L (ref 98–111)
Creatinine, Ser: 0.51 mg/dL — ABNORMAL LOW (ref 0.61–1.24)
GFR, Estimated: >60 mL/min (ref >60)
Glucose, Bld: 98 mg/dL (ref 70–99)
Potassium: 3.4 mmol/L — ABNORMAL LOW (ref 3.5–5.1)
Sodium: 139 mmol/L (ref 135–145)
Total Bilirubin: 2.4 mg/dL — ABNORMAL HIGH (ref 0.3–1.2)
Total Protein: 7 g/dL (ref 6.5–8.1)

## 2019-12-19 LAB — TROPONIN I (HIGH SENSITIVITY)
Troponin I (High Sensitivity): 6 ng/L (ref <18)
Troponin I (High Sensitivity): 7 ng/L (ref <18)

## 2019-12-19 MED ORDER — HYDROMORPHONE HCL 1 MG/ML IJ SOLN
2.0000 mg | INTRAMUSCULAR | Status: AC
  Administered 2019-12-19: 2 mg via INTRAVENOUS
  Filled 2019-12-19: qty 2

## 2019-12-19 MED ORDER — ONDANSETRON HCL 4 MG/2ML IJ SOLN
4.0000 mg | INTRAMUSCULAR | Status: DC | PRN
  Filled 2019-12-19: qty 2

## 2019-12-19 MED ORDER — DIPHENHYDRAMINE HCL 25 MG PO CAPS
25.0000 mg | ORAL_CAPSULE | ORAL | Status: DC | PRN
  Administered 2019-12-19: 25 mg via ORAL
  Filled 2019-12-19: qty 1

## 2019-12-19 MED ORDER — SODIUM CHLORIDE 0.45 % IV SOLN: INTRAVENOUS | Status: DC

## 2019-12-19 NOTE — ED Notes (Signed)
Patient refused blood tests at triage .  

## 2019-12-19 NOTE — ED Notes (Signed)
Pt resting in bed. Reporting generalized pain.

## 2019-12-19 NOTE — Discharge Instructions (Addendum)
Please follow-up with your sick cell clinic for further management of your condition.

## 2019-12-19 NOTE — Progress Notes (Addendum)
PIV consult: Arrived to ED, site established already. Cancel consult.

## 2019-12-19 NOTE — ED Notes (Signed)
3 iv sticks attempts made

## 2019-12-19 NOTE — ED Provider Notes (Signed)
[facility] EMERGENCY DEPARTMENT Provider Note   CSN: [id number] Arrival date & time: 12/19/19  6440     History Chief Complaint  Patient presents with  . Sickle Cell Pain Crisis    Timothy Marks is a 29 y.o. 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  Patient states that he is having pain to the anterior chest and in the lower back which feels consistent with his prior sickle cell pain crises.  He states the pain is constant, mildly alleviated some for brief periods of time with his at home pain medications, no specific aggravating factors. Currently a 10/10 in severity.  No change with deep breathing or exertion.  He denies fever, chills, cough, hemoptysis, leg pain/swelling, hemoptysis, recent surgery/trauma, recent long travel, hormone use, or cancer.  Denies numbness, tingling, weakness, incontinence, or IVDU.  He has not missed any doses of his Xarelto.   HPI     Past Medical History:  Diagnosis Date  . Depression   . Generalized weakness 03/31/2019  . 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 Sickle cell anemia (HCC)   . Vitamin D deficiency y-6    Patient Active Problem List   Diagnosis Date Noted  . Underweight 06/14/2019  . Generalized weakness 03/31/2019  . Sickle-cell crisis ([place]) 03/24/2019  . SIRS (systemic inflammatory response syndrome) ([place]) 02/10/2019  . Abnormal liver function 11/04/2018  . Sickle cell anemia with crisis ([place]) 05/22/2018  . Narcotic dependence ([place]) 02/10/2018  . Acute bilateral low back pain without sciatica   . Other chronic pain   . Anemia   . Socially inappropriate behavior 12/02/2017  . Thrombocythemia   . Physical deconditioning   . Agitation   . Chronic pain syndrome   . Recurrent cold sores   . Fever   . Drug-seeking  behavior 08/30/2017  . Pain   . Sickle cell crisis ([place]) 07/16/2017  . Adjustment disorder with mixed disturbance of emotions and conduct   . Bilateral pulmonary infiltrates on chest x-ray 11/21/2016  . History of DVT (deep vein thrombosis) 11/17/2016  . Suicidal ideations 10/11/2016  . Reticulocytosis 10/09/2016  . DVT (deep venous thrombosis) ([place]) 08/02/2016  . Has difficulty accessing primary care provider for most visits 07/11/2015  . 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 Hordeolum externum of left upper eyelid 05/31/2015  . Major depressive disorder with current active episode 11/09/2014  . Hb-SS disease with acute chest syndrome ([place]) 11/08/2014  . Pneumonia of right upper lobe due to infectious organism 11/07/2014  . Constipation 08/03/2014  . Leukocytosis 06/30/2014  . 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 Protein-calorie malnutrition, severe ([place]) 04/09/2014  . Major depressive disorder, recurrent, severe without psychotic features ([place])   . 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 Vitamin D deficiency   . Sickle cell anemia ([place]) 03/30/2014  . Hyperbilirubinemia 03/30/2014  . Hypokalemia 02/07/2014  . Mandible fracture ([place]) 05/03/2013  . Aggressive behavior 01/25/2013  . 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 Sickle cell anemia with pain ([place]) 01/24/2013  . Sickle cell pain crisis ([place]) 01/24/2013  . 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 Other hemoglobinopathies ([place]) 04/22/2011  . 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 CHOLECYSTECTOMY         Family History  Problem Relation Age of Onset  . Sickle cell trait Mother   . Sickle cell trait Father     Social History   Tobacco Use  . Smoking status: Never Smoker  . Smokeless tobacco: Never Used  Vaping Use  . Vaping Use: Never used  Substance Use Topics  .  Alcohol use: No  . Drug use: Not Currently    Types: Marijuana    Home Medications Prior to Admission medications   Medication Sig Start Date End Date Taking? Authorizing Provider  acetaminophen (TYLENOL) 500 MG tablet Take 500-1,000 mg by mouth every 6 (six) hours as needed for mild pain or headache.    [provider]  cholecalciferol (VITAMIN D) 1000 units tablet Take 2 tablets (2,000 Units total) by mouth daily. 12/13/17   Azzie Glatter, FNP  folic acid (FOLVITE) 1 MG tablet Take 1 tablet (1 mg total) by mouth daily. 05/01/17   Dorena Dew, FNP  gabapentin (NEURONTIN) 300 MG capsule Take 1 capsule (300 mg total) by mouth 3 (three) times daily. 03/12/19   Dorena Dew, FNP  hydroxyurea (HYDREA) 500 MG capsule Take 1 capsule (500 mg total) by mouth 2 (two) times daily. May take with food to minimize GI side effects. Patient taking differently: Take 500 mg by mouth 2 (two) times daily.  05/01/17   Dorena Dew, FNP  Nutritional Supplements (ENSURE ACTIVE HIGH PROTEIN) LIQD Take 1 each by mouth 3 (three) times daily between meals. #90 cans of Ensure Patient taking differently: Take 1 each by mouth See admin instructions. 5 times daily 11/13/18   Dorena Dew, FNP  oxyCODONE-acetaminophen (PERCOCET) 10-325 MG tablet Take 1 tablet by mouth every 6 (six) hours as needed for up to 15 days for pain. 12/12/19 12/27/19  Vevelyn Francois, NP  rivaroxaban (XARELTO) 20 MG TABS tablet Take 1 tablet (20 mg total) by mouth daily with supper. 10/29/19 01/05/20  Vevelyn Francois, NP    Allergies    Buprenorphine hcl, Levaquin [levofloxacin], Meperidine, Morphine, Morphine and related, Toradol [ketorolac tromethamine], Tramadol, Vancomycin, and Zosyn [piperacillin sod-tazobactam so]  Review of Systems   Review of Systems  Constitutional: Negative for chills and fever.  Respiratory: Negative for cough and shortness of breath.   Cardiovascular: Positive for chest pain.    Gastrointestinal: Negative for abdominal pain, constipation, diarrhea and vomiting.  Genitourinary: Negative for dysuria.  Musculoskeletal: Positive for back pain.  Neurological: Negative for weakness and numbness.       Negative for incontinence or saddle anesthesia.  All other systems reviewed and are negative.   Physical Exam Updated Vital Signs BP 120/74 (BP Location: Left Arm)   Pulse 75   Temp 98.8 F (37.1 C) (Oral)   Resp 16   Ht 6\' 2"  (1.88 m)   Wt 65 kg   SpO2 99%   BMI 18.40 kg/m   Physical Exam Vitals and nursing note reviewed.  Constitutional:      General: He is not in acute distress.    Appearance: He is well-developed. He is not toxic-appearing.  HENT:     Head: Normocephalic and atraumatic.  Eyes:     General:        Right eye: No discharge.        Left eye: No discharge.     Conjunctiva/sclera: Conjunctivae normal.  Cardiovascular:     Rate and Rhythm: Normal  rate and regular rhythm.     Comments: 2+ symmetric radial pulses.  Pulmonary:     Effort: Pulmonary effort is normal. No respiratory distress.     Breath sounds: Normal breath sounds. No wheezing, rhonchi or rales.  Chest:     Chest wall: Tenderness (Anterior chest wall.) present.  Abdominal:     General: There is no distension.     Palpations: Abdomen is soft.     Tenderness: There is no abdominal tenderness.  Musculoskeletal:     Cervical back: Neck supple.     Comments: No point/focal vertebral tenderness or palpable step off. Bilateral lumbar paraspinal muscle tenderness to palpation.  Moving all extremities. No focal bony tenderness. Compartments are soft.   Skin:    General: Skin is warm and dry.     Findings: No rash.  Neurological:     Mental Status: He is alert.     Comments: Clear speech.  Sensation grossly intact bilateral lower extremities.  5-5 symmetric strength with plantar dorsiflexion bilaterally. Ambulatory.   Psychiatric:        Behavior: Behavior normal.    ED  Results / Procedures / Treatments   Labs (all labs ordered are listed, but only abnormal results are displayed) Labs Reviewed  COMPREHENSIVE METABOLIC PANEL  CBC WITH DIFFERENTIAL/PLATELET  RETICULOCYTES  TROPONIN I (HIGH SENSITIVITY)    EKG EKG Interpretation  Date/Time:  Thursday December 19 2019 05:36:22 EDT Ventricular Rate:  78 PR Interval:  144 QRS Duration: 94 QT Interval:  370 QTC Calculation: 421 R Axis:   84 Text Interpretation: Normal sinus rhythm Confirmed by Randal Buba, April ([id number]) on 12/19/2019 6:12:23 AM   Radiology DG Chest 2 View  Result Date: 12/19/2019 CLINICAL DATA:  Chest pain.  Sickle cell disease. EXAM: CHEST - 2 VIEW COMPARISON:  12/15/2019.  08/30/2019. FINDINGS: Mediastinum and hilar structures normal. Mild left base pleural-parenchymal thickening consistent scarring again noted. Very mild increase interstitial markings noted bilaterally. Mild pneumonitis cannot be excluded. No pleural effusion or pneumothorax. Heart size normal. Bony changes of sickle cell disease noted in the thoracic spine. No acute bony abnormality identified. Surgical clips right upper quadrant. IMPRESSION: Very mild increase interstitial markings noted bilaterally. Mild pneumonitis cannot be excluded. Electronically Signed   By: Marcello Moores  Register   On: 12/19/2019 06:11    Procedures Procedures (including critical care time)  Medications Ordered in ED Medications - No data to display  ED Course  I have reviewed the triage vital signs and the nursing notes.  Pertinent labs & imaging results that were available during my care of the patient were reviewed by me and considered in my medical decision making (see chart for details).    MDM Rules/Calculators/A&P                         Patient presents to the ED with complaints of pain to the chest & lower back that feels consistent w/ his prior sickle cell pain crisis x 5 days. Nontoxic, resting comfortably on assessment. Vitals are  WNL. Pain reproducible with anterior chest wall palpation & bilateral lumbar paraspinal muscle palpation. In terms of his back pain- no neuro deficits and ambulatory does not seem consistent with cauda equina syndrome or cord compression.  Denies history of IVDU and is afebrile therefore feel that epidural abscess would be less likely.  In terms of his chest pain that is reproducible with chest wall palpation, EKG, chest x-ray, and troponins ordered in addition  to basic labs with reticulocyte count. Prior VTE compliant on anticoagulation therefore feel that PE would be less likely especially as patient is not hypoxic or tachycardic. He has not had fever, cough, or dyspnea either which is reassuring. Sickle cell pain crisis protocol initiated.  Additional history obtained:  Additional history obtained from chart review & nursing note review. Previous records obtained and reviewed.  Seen for similar 12/15/19.   EKG: NSR, no significant ischemic changes.   Imaging Studies ordered:  CXR ordered per triage protocol, I independently visualized and interpreted imaging: Very mild increase interstitial markings noted bilaterally. Mild pneumonitis cannot be excluded per radiology read.   Lab Tests:  CBC, CMP, reticulocyte count, & troponins ordered.   Patient care signed out to Domenic Moras PA-C at change of shift pending labs & disposition.   Portions of this note were generated with [employer]. Dictation errors may occur despite best attempts at proofreading.  Final Clinical Impression(s) / ED Diagnoses Final diagnoses:  None    Rx / DC Orders ED Discharge Orders    None       Amaryllis Dyke, PA-C 12/19/19 6701    Palumbo, April, MD 12/19/19 0710

## 2019-12-19 NOTE — ED Triage Notes (Signed)
Patient reports sickle pain at central chest and lower back this week .

## 2019-12-19 NOTE — ED Provider Notes (Signed)
Received signout at the beginning of shift, please see previous providers notes for complete H&P.  This is a 29 year old male significant history of sickle cell disease presenting complaining of pain to his chest and lower back however over the past 5 days that felt similar to prior sickle cell crisis.  He was seen in the ED several days ago for same.  He has reproducible anterior chest wall pain as well as lower back pain on palpation.  Initial chest x-ray shows very mild increased interstitial markings noted bilaterally with mild pneumonitis which cannot be excluded.  Patient does have history of marijuana use which may contribute to this finding.  9:18 AM Patient received 3 separate dose of pain medication and did report improvement of his symptoms.  He is stable to go home.  He did have a drop in his hemoglobin compared to values several days ago.  Encourage patient to follow-up closely with the sickle cell clinic as he has had an upcoming appointment for further evaluation.  Return precaution given.  BP 120/69   Pulse 67   Temp 98.8 F (37.1 C) (Oral)   Resp 16   Ht 6\' 2"  (1.88 m)   Wt 65 kg   SpO2 100%   BMI 18.40 kg/m   Results for orders placed or performed during the hospital encounter of 12/19/19  Comprehensive metabolic panel  Result Value Ref Range   Sodium 139 135 - 145 mmol/L   Potassium 3.4 (L) 3.5 - 5.1 mmol/L   Chloride 104 98 - 111 mmol/L   CO2 25 22 - 32 mmol/L   Glucose, Bld 98 70 - 99 mg/dL   BUN 5 (L) 6 - 20 mg/dL   Creatinine, Ser 0.51 (L) 0.61 - 1.24 mg/dL   Calcium 8.9 8.9 - 10.3 mg/dL   Total Protein 7.0 6.5 - 8.1 g/dL   Albumin 3.8 3.5 - 5.0 g/dL   AST 22 15 - 41 U/L   ALT 10 0 - 44 U/L   Alkaline Phosphatase 62 38 - 126 U/L   Total Bilirubin 2.4 (H) 0.3 - 1.2 mg/dL   GFR, Estimated >60 >60 mL/min   Anion gap 10 5 - 15  CBC with Differential  Result Value Ref Range   WBC 10.8 (H) 4.0 - 10.5 K/uL   RBC 2.88 (L) 4.22 - 5.81 MIL/uL   Hemoglobin 8.8 (L)  13.0 - 17.0 g/dL   HCT 25.6 (L) 39 - 52 %   MCV 88.9 80.0 - 100.0 fL   MCH 30.6 26.0 - 34.0 pg   MCHC 34.4 30.0 - 36.0 g/dL   RDW 16.9 (H) 11.5 - 15.5 %   Platelets 421 (H) 150 - 400 K/uL   nRBC 1.3 (H) 0.0 - 0.2 %   Neutrophils Relative % 69 %   Neutro Abs 7.4 1.7 - 7.7 K/uL   Lymphocytes Relative 18 %   Lymphs Abs 1.9 0.7 - 4.0 K/uL   Monocytes Relative 11 %   Monocytes Absolute 1.2 (H) 0.1 - 1.0 K/uL   Eosinophils Relative 2 %   Eosinophils Absolute 0.2 0.0 - 0.5 K/uL   Basophils Relative 0 %   Basophils Absolute 0.0 0.0 - 0.1 K/uL   Immature Granulocytes 0 %   Abs Immature Granulocytes 0.04 0.00 - 0.07 K/uL  Reticulocytes  Result Value Ref Range   Retic Ct Pct 7.5 (H) 0.4 - 3.1 %   RBC. 2.88 (L) 4.22 - 5.81 MIL/uL   Retic Count, Absolute 214.6 (H)  19.0 - 186.0 K/uL   Immature Retic Fract 28.0 (H) 2.3 - 15.9 %  Troponin I (High Sensitivity)  Result Value Ref Range   Troponin I (High Sensitivity) 6 <18 ng/L   DG Chest 2 View  Result Date: 12/19/2019 CLINICAL DATA:  Chest pain.  Sickle cell disease. EXAM: CHEST - 2 VIEW COMPARISON:  12/15/2019.  08/30/2019. FINDINGS: Mediastinum and hilar structures normal. Mild left base pleural-parenchymal thickening consistent scarring again noted. Very mild increase interstitial markings noted bilaterally. Mild pneumonitis cannot be excluded. No pleural effusion or pneumothorax. Heart size normal. Bony changes of sickle cell disease noted in the thoracic spine. No acute bony abnormality identified. Surgical clips right upper quadrant. IMPRESSION: Very mild increase interstitial markings noted bilaterally. Mild pneumonitis cannot be excluded. Electronically Signed   By: Marcello Moores  Register   On: 12/19/2019 06:11   DG Chest 2 View  Result Date: 12/15/2019 CLINICAL DATA:  Chest pain, sickle cell disease. EXAM: CHEST - 2 VIEW COMPARISON:  Chest radiograph dated 08/30/2019. FINDINGS: The heart size and mediastinal contours are within normal limits.  Both lungs are clear. The visualized skeletal structures are unremarkable. IMPRESSION: No active cardiopulmonary disease. Electronically Signed   By: Zerita Boers M.D.   On: 12/15/2019 18:55      Domenic Moras, PA-C 12/19/19 0919    Palumbo, April, MD 12/19/19 2311

## 2019-12-20 ENCOUNTER — Other Ambulatory Visit: Payer: Self-pay | Admitting: Nurse Practitioner

## 2019-12-20 DIAGNOSIS — Z79891 Long term (current) use of opiate analgesic: Secondary | ICD-10-CM

## 2019-12-20 DIAGNOSIS — G894 Chronic pain syndrome: Secondary | ICD-10-CM

## 2019-12-20 MED ORDER — OXYCONTIN 20 MG PO T12A: 20.0000 mg | EXTENDED_RELEASE_TABLET | Freq: Two times a day (BID) | ORAL | 0 refills | Status: DC

## 2019-12-23 ENCOUNTER — Other Ambulatory Visit: Payer: Self-pay | Admitting: Nurse Practitioner

## 2019-12-23 ENCOUNTER — Other Ambulatory Visit (INDEPENDENT_AMBULATORY_CARE_PROVIDER_SITE_OTHER): Payer: Medicaid Other | Admitting: Nurse Practitioner

## 2019-12-23 ENCOUNTER — Telehealth: Payer: Medicaid Other | Admitting: Nurse Practitioner

## 2019-12-23 DIAGNOSIS — Z79891 Long term (current) use of opiate analgesic: Secondary | ICD-10-CM

## 2019-12-23 DIAGNOSIS — G894 Chronic pain syndrome: Secondary | ICD-10-CM

## 2019-12-23 MED ORDER — OXYCONTIN 20 MG PO T12A: 20.0000 mg | EXTENDED_RELEASE_TABLET | Freq: Two times a day (BID) | ORAL | 0 refills | Status: DC

## 2019-12-23 NOTE — Progress Notes (Signed)
   [place], [place]  [id number] Phone:  [phone]   Fax:  [phone]  Virtual Visit via Telephone Note  I connected with [name] on 12/23/19 at  3:00 PM EST by telephone and verified that I am speaking with the correct person using two identifiers.   I discussed the limitations, risks, security and privacy concerns of performing an evaluation and management service by telephone and the availability of in person appointments. I also discussed with the patient that there may be a patient responsible charge related to this service. The patient expressed understanding and agreed to proceed.  Patient home Provider Office  History of Present Illness:    Observations/Objective:   Assessment and Plan:   Follow Up Instructions:    I discussed the assessment and treatment plan with the patient. The patient was provided an opportunity to ask questions and all were answered. The patient agreed with the plan and demonstrated an understanding of the instructions.   The patient was advised to call back or seek an in-person evaluation if the symptoms worsen or if the condition fails to improve as anticipated.  I provided 5 minutes of non-face-to-face time during this encounter.   Vevelyn Francois, NP

## 2019-12-23 NOTE — Progress Notes (Signed)
Patient came to the clinic this afternoon around 3:45 PM with his girlfriend.  Major concern is an earlier conversation with his PCP who had told him that she will not prescribe narcotics going forward for 2 major violations of his opiate prescription contract: First, several past urine drug screen has been negative for the prescribed medications which include OxyContin 20 mg tablet every 12 hourly, and oxycodone-acetaminophen 10-325 mg tablet p.o. every 6 hourly as needed pain. Secondly, there was a recent message on MyChart that her girlfriend shares these medications with her.  Their concerns were, that there is no way the PCP would have known that they have boyfriend/girlfriend relationship and claiming this is a HIPAA violation because there was no discussion about being live-in lovers.  They therefore wanted the prescription to continue because according to patient and his girlfriend, this is a "lie". Patient and his girlfriend spoke for well over 15 minutes without interruption while girlfriend went on to describe her pain due to rheumatoid arthritis and how she was prescribed steroid by same PCP here, only to have the dosage increased by to rheumatologist, and in her words, "PCP should have known from the beginning that she needed a higher dose or give her a prescription for narcotics which is what really worked for her".  In my attempts to explain the stringent rules and regulations around narcotic prescriptions and why we take it seriously when urine drug screen is negative or when medications are shared between patients, patient and his girlfriend became belligerent and claimed it was an attempt to defend and went on to say "I better record this conversation because I am going to be contacting my lawyer to sue you all".  As soon as they said these, I told them I'll like to end the conversation because I reserve the right to participate or not participate in a recorded session with my patients and  today I do not want to participate. And if they would like to discuss these at some other time, I will be willing to have the conversation and I offered to see patient going forward, since he said he will not like to see her current PCP anymore.  Patient and his girlfriend stomped out of the room in anger and in loud voices said "you will be hearing from our lawyer".  Of note: Patient has been known both in inpatient, outpatient setting and in the day hospital to be extremely rude to the employees including nurses and providers, using profanity. And on multiple occasions his urine drug screens have been negative of opiates while on active prescriptions.  Angelica Chessman, MD  4:23 PM 12/23/19

## 2019-12-23 NOTE — Progress Notes (Signed)
   [place], [place]  [address] Phone:  [phone]   Fax:  [phone]  Virtual Visit via Telephone Note  I connected with Timothy Marks on 12/23/19 at  3:00 pm by telephone and verified that I am speaking with the correct person using two identifiers.   I discussed the limitations, risks, security and privacy concerns of performing an evaluation and management service by telephone and the availability of in person appointments. I also discussed with the patient that there may be a patient responsible charge related to this service. The patient expressed understanding and agreed to proceed.  Patient home Provider Office  History of Present Illness: Timothy Marks was called to discuss his current treatment regimen. Timothy Marks was informed that I would no longer be able to provide him with any additional prescription opioids.  This was because it was brought to my attention that he was sharing his Oxycodone and also his urine drug screen was inappropriate.    Objective:   No exam was completed due to virtual visit.    Assessment:   Chronic Pain Syndrome related to Sickle Cell Disease  Timothy Marks was made aware that I would provide him with 30 days of his current Oxycodone regimen. He thanked me for the information and agreed to the treatment plan.     Follow Up Instructions:   2-3 Minutes later Timothy Marks requested a call back. Upon returning the call. Timothy Marks asked me to repeat what was just discussed. His male Marks got on the phone and began to yell and scream about his treatment plan and how difficult it was to find a new a doctor. He first stated that Timothy Marks did not say that Timothy Marks used his medication but that it was the medication of Timothy Marks but then Timothy Marks said, " I had to use his medication because you would not give me any".   They continued to yell and I just hung up the phone.     I discussed the assessment and treatment plan with  the patient. The patient was provided an opportunity to ask questions and all were answered. The patient agreed with the plan and demonstrated an understanding of the instructions.   The patient was advised to call back or seek an in-person evaluation if the symptoms worsen or if the condition fails to improve as anticipated.  I provided 5 minutes of non-face-to-face time during this encounter.   Vevelyn Francois, NP

## 2019-12-25 ENCOUNTER — Telehealth: Payer: Self-pay | Admitting: Nurse Practitioner

## 2019-12-25 ENCOUNTER — Other Ambulatory Visit: Payer: Self-pay | Admitting: Nurse Practitioner

## 2019-12-25 DIAGNOSIS — G894 Chronic pain syndrome: Secondary | ICD-10-CM

## 2019-12-25 DIAGNOSIS — D571 Sickle-cell disease without crisis: Secondary | ICD-10-CM

## 2019-12-25 MED ORDER — OXYCODONE-ACETAMINOPHEN 10-325 MG PO TABS: 1.0000 | ORAL_TABLET | Freq: Four times a day (QID) | ORAL | 0 refills | Status: DC | PRN

## 2019-12-26 NOTE — Telephone Encounter (Signed)
Done

## 2019-12-27 ENCOUNTER — Ambulatory Visit: Payer: Self-pay | Admitting: Nurse Practitioner

## 2019-12-31 ENCOUNTER — Other Ambulatory Visit: Payer: Self-pay | Admitting: Family Medicine

## 2019-12-31 DIAGNOSIS — D571 Sickle-cell disease without crisis: Secondary | ICD-10-CM

## 2019-12-31 DIAGNOSIS — G894 Chronic pain syndrome: Secondary | ICD-10-CM

## 2019-12-31 MED ORDER — OXYCODONE-ACETAMINOPHEN 10-325 MG PO TABS: 1.0000 | ORAL_TABLET | Freq: Four times a day (QID) | ORAL | 0 refills | Status: DC | PRN

## 2020-01-02 ENCOUNTER — Telehealth: Payer: Self-pay

## 2020-01-02 NOTE — Telephone Encounter (Signed)
Contacted Medicaid for Percocet PA status. PA approved for 30 days 12/31/19-01/30/20. PA # is [id number]. Pt aware and informed med can be filled/picked up from pharmacy today, pt verbalized understanding.

## 2020-01-05 ENCOUNTER — Encounter: Payer: Self-pay | Admitting: Family Medicine

## 2020-01-14 ENCOUNTER — Telehealth (HOSPITAL_COMMUNITY): Payer: Self-pay | Admitting: *Deleted

## 2020-01-14 ENCOUNTER — Non-Acute Institutional Stay (HOSPITAL_COMMUNITY)
Admission: AD | Admit: 2020-01-14 | Discharge: 2020-01-14 | Disposition: A | Discharge status: Home / Self Care | Payer: Medicaid Other | Source: Ambulatory Visit | Attending: Internal Medicine | Admitting: Internal Medicine

## 2020-01-14 ENCOUNTER — Encounter (HOSPITAL_COMMUNITY): Payer: Self-pay | Admitting: Family Medicine

## 2020-01-14 DIAGNOSIS — Z9049 Acquired absence of other specified parts of digestive tract: Secondary | ICD-10-CM | POA: Insufficient documentation

## 2020-01-14 DIAGNOSIS — Z881 Allergy status to other antibiotic agents status: Secondary | ICD-10-CM | POA: Diagnosis not present

## 2020-01-14 DIAGNOSIS — F1120 Opioid dependence, uncomplicated: Secondary | ICD-10-CM | POA: Diagnosis not present

## 2020-01-14 DIAGNOSIS — Z888 Allergy status to other drugs, medicaments and biological substances status: Secondary | ICD-10-CM | POA: Diagnosis not present

## 2020-01-14 DIAGNOSIS — Z885 Allergy status to narcotic agent status: Secondary | ICD-10-CM | POA: Diagnosis not present

## 2020-01-14 DIAGNOSIS — G894 Chronic pain syndrome: Secondary | ICD-10-CM | POA: Diagnosis not present

## 2020-01-14 DIAGNOSIS — D638 Anemia in other chronic diseases classified elsewhere: Secondary | ICD-10-CM | POA: Diagnosis not present

## 2020-01-14 DIAGNOSIS — Z79899 Other long term (current) drug therapy: Secondary | ICD-10-CM | POA: Insufficient documentation

## 2020-01-14 DIAGNOSIS — D5700 Hb-SS disease with crisis, unspecified: Secondary | ICD-10-CM

## 2020-01-14 DIAGNOSIS — Z832 Family history of diseases of the blood and blood-forming organs and certain disorders involving the immune mechanism: Secondary | ICD-10-CM | POA: Diagnosis not present

## 2020-01-14 DIAGNOSIS — Z7901 Long term (current) use of anticoagulants: Secondary | ICD-10-CM | POA: Insufficient documentation

## 2020-01-14 DIAGNOSIS — Z880 Allergy status to penicillin: Secondary | ICD-10-CM | POA: Diagnosis not present

## 2020-01-14 LAB — CBC WITH DIFFERENTIAL/PLATELET
Abs Immature Granulocytes: 0.08 10*3/uL — ABNORMAL HIGH (ref 0.00–0.07)
Basophils Absolute: 0 10*3/uL (ref 0.0–0.1)
Basophils Relative: 0 %
Eosinophils Absolute: 0.4 10*3/uL (ref 0.0–0.5)
Eosinophils Relative: 4 %
HCT: 25.4 % — ABNORMAL LOW (ref 39.0–52.0)
Hemoglobin: 9.1 g/dL — ABNORMAL LOW (ref 13.0–17.0)
Immature Granulocytes: 1 %
Lymphocytes Relative: 21 %
Lymphs Abs: 2.2 10*3/uL (ref 0.7–4.0)
MCH: 30.6 pg (ref 26.0–34.0)
MCHC: 35.8 g/dL (ref 30.0–36.0)
MCV: 85.5 fL (ref 80.0–100.0)
Monocytes Absolute: 1 10*3/uL (ref 0.1–1.0)
Monocytes Relative: 10 %
Neutro Abs: 6.6 10*3/uL (ref 1.7–7.7)
Neutrophils Relative %: 64 %
Platelets: 513 10*3/uL — ABNORMAL HIGH (ref 150–400)
RBC: 2.97 MIL/uL — ABNORMAL LOW (ref 4.22–5.81)
RDW: 19.3 % — ABNORMAL HIGH (ref 11.5–15.5)
WBC: 10.4 10*3/uL (ref 4.0–10.5)
nRBC: 1.7 % — ABNORMAL HIGH (ref 0.0–0.2)

## 2020-01-14 LAB — COMPREHENSIVE METABOLIC PANEL
ALT: 9 U/L (ref 0–44)
AST: 24 U/L (ref 15–41)
Albumin: 4.5 g/dL (ref 3.5–5.0)
Alkaline Phosphatase: 67 U/L (ref 38–126)
Anion gap: 9 (ref 5–15)
BUN: 6 mg/dL (ref 6–20)
CO2: 25 mmol/L (ref 22–32)
Calcium: 8.9 mg/dL (ref 8.9–10.3)
Chloride: 103 mmol/L (ref 98–111)
Creatinine, Ser: 0.53 mg/dL — ABNORMAL LOW (ref 0.61–1.24)
GFR, Estimated: >60 mL/min (ref >60)
Glucose, Bld: 93 mg/dL (ref 70–99)
Potassium: 3.5 mmol/L (ref 3.5–5.1)
Sodium: 137 mmol/L (ref 135–145)
Total Bilirubin: 3.4 mg/dL — ABNORMAL HIGH (ref 0.3–1.2)
Total Protein: 7.6 g/dL (ref 6.5–8.1)

## 2020-01-14 LAB — RETICULOCYTES
Immature Retic Fract: 37.2 % — ABNORMAL HIGH (ref 2.3–15.9)
RBC.: 2.96 MIL/uL — ABNORMAL LOW (ref 4.22–5.81)
Retic Count, Absolute: 315.8 10*3/uL — ABNORMAL HIGH (ref 19.0–186.0)
Retic Ct Pct: 10.7 % — ABNORMAL HIGH (ref 0.4–3.1)

## 2020-01-14 MED ORDER — ACETAMINOPHEN 500 MG PO TABS
1000.0000 mg | ORAL_TABLET | Freq: Once | ORAL | Status: AC
  Administered 2020-01-14: 1000 mg via ORAL
  Filled 2020-01-14: qty 2

## 2020-01-14 MED ORDER — HYDROMORPHONE 1 MG/ML IV SOLN
INTRAVENOUS | Status: DC
  Administered 2020-01-14: 14 mg via INTRAVENOUS
  Filled 2020-01-14: qty 25

## 2020-01-14 MED ORDER — ONDANSETRON HCL 4 MG/2ML IJ SOLN
4.0000 mg | Freq: Four times a day (QID) | INTRAMUSCULAR | Status: DC | PRN
  Administered 2020-01-14: 4 mg via INTRAVENOUS
  Filled 2020-01-14: qty 2

## 2020-01-14 MED ORDER — DIPHENHYDRAMINE HCL 25 MG PO CAPS
25.0000 mg | ORAL_CAPSULE | Freq: Once | ORAL | Status: DC
  Filled 2020-01-14: qty 1

## 2020-01-14 MED ORDER — KETOROLAC TROMETHAMINE 30 MG/ML IJ SOLN
15.0000 mg | Freq: Once | INTRAMUSCULAR | Status: AC
  Administered 2020-01-14: 15 mg via INTRAVENOUS
  Filled 2020-01-14: qty 1

## 2020-01-14 MED ORDER — NALOXONE HCL 0.4 MG/ML IJ SOLN: 0.4000 mg | INTRAMUSCULAR | Status: DC | PRN

## 2020-01-14 MED ORDER — SODIUM CHLORIDE 0.9% FLUSH: 9.0000 mL | INTRAVENOUS | Status: DC | PRN

## 2020-01-14 MED ORDER — SODIUM CHLORIDE 0.45 % IV SOLN: INTRAVENOUS | Status: DC

## 2020-01-14 MED ORDER — DIPHENHYDRAMINE HCL 25 MG PO CAPS
25.0000 mg | ORAL_CAPSULE | ORAL | Status: DC | PRN
  Administered 2020-01-14: 25 mg via ORAL

## 2020-01-14 NOTE — Discharge Summary (Signed)
[facility] Discharge Summary   Patient ID: Timothy Marks MRN: [id number] DOB/AGE: 05/16/1990 29 y.o.  Admit date: 01/14/2020 Discharge date: 01/14/2020  Primary Care Physician:  No primary care provider on file.  Admission Diagnoses:  Active Problems:   Sickle cell pain crisis [facility])  Discharge Medications:  Allergies as of 01/14/2020      Reactions   Buprenorphine Hcl Hives   Levaquin [levofloxacin] Itching   Meperidine Rash   Morphine Hives   Morphine And Related Hives   Toradol [ketorolac Tromethamine] Itching   Tramadol Hives   Vancomycin Itching   Zosyn [piperacillin Sod-tazobactam So] Itching, Rash, Other (See Comments)   Has taken rocephin in the past      Medication List    TAKE these medications   acetaminophen 500 MG tablet Commonly known as: TYLENOL Take 500-1,000 mg by mouth every 6 (six) hours as needed for mild pain or headache.   cholecalciferol 25 MCG (1000 UNIT) tablet Commonly known as: VITAMIN D Take 2 tablets (2,000 Units total) by mouth daily.   Ensure Active High Protein Liqd Take 1 each by mouth 3 (three) times daily between meals. #90 cans of Ensure What changed:   when to take this  additional instructions   folic acid 1 MG tablet Commonly known as: FOLVITE Take 1 tablet (1 mg total) by mouth daily.   gabapentin 300 MG capsule Commonly known as: NEURONTIN Take 1 capsule (300 mg total) by mouth 3 (three) times daily.   hydroxyurea 500 MG capsule Commonly known as: HYDREA Take 1 capsule (500 mg total) by mouth 2 (two) times daily. May take with food to minimize GI side effects. What changed: additional instructions   oxyCODONE-acetaminophen 10-325 MG tablet Commonly known as: Percocet Take 1 tablet by mouth every 6 (six) hours as needed for up to 15 days for pain.   OxyCONTIN 20 mg 12 hr tablet Generic drug: oxyCODONE Take 1 tablet (20 mg total) by mouth every 12 (twelve) hours.   rivaroxaban 20 MG Tabs  tablet Commonly known as: XARELTO Take 1 tablet (20 mg total) by mouth daily with supper.        Consults:  None  Significant Diagnostic Studies:  DG Chest 2 View  Result Date: 12/19/2019 CLINICAL DATA:  Chest pain.  Sickle cell disease. EXAM: CHEST - 2 VIEW COMPARISON:  12/15/2019.  08/30/2019. FINDINGS: Mediastinum and hilar structures normal. Mild left base pleural-parenchymal thickening consistent scarring again noted. Very mild increase interstitial markings noted bilaterally. Mild pneumonitis cannot be excluded. No pleural effusion or pneumothorax. Heart size normal. Bony changes of sickle cell disease noted in the thoracic spine. No acute bony abnormality identified. Surgical clips right upper quadrant. IMPRESSION: Very mild increase interstitial markings noted bilaterally. Mild pneumonitis cannot be excluded. Electronically Signed   By: Marcello Moores  Register   On: 12/19/2019 06:11   DG Chest 2 View  Result Date: 12/15/2019 CLINICAL DATA:  Chest pain, sickle cell disease. EXAM: CHEST - 2 VIEW COMPARISON:  Chest radiograph dated 08/30/2019. FINDINGS: The heart size and mediastinal contours are within normal limits. Both lungs are clear. The visualized skeletal structures are unremarkable. IMPRESSION: No active cardiopulmonary disease. Electronically Signed   By: Zerita Boers M.D.   On: 12/15/2019 18:55   History of present illness:  Timothy Marks is a 29 year old male with a medical history significant for sickle cell disease, chronic pain syndrome, opiate dependence and tolerance, and history of anemia of chronic disease presents with complaints of low back  pain that is consistent with his previous pain crisis.  Patient says that pain intensity has been elevated over the past several days and unrelieved by his home medications.  He last had Percocet 10-325 mg on last night without sustained relief.  He says that he is currently out of medication.  Current pain intensity is 9/10 characterized  as constant and aching.  Patient denies any subjective fevers, chills, shortness of breath, headache.  No nausea, vomiting, diarrhea, or urinary symptoms.  No sick contacts, recent travel, or exposure to COVID-19.  [facility] Course: Patient admitted to sickle cell day infusion clinic for management of pain crisis.  Reviewed all laboratory values, largely within normal limits. Pain managed with IV Dilaudid via PCA with settings of 0.5 mg, 10-minute lockout, and 2 mg/h. Toradol 15 mg IV x1 Tylenol 1000 mg by mouth x1 IV fluids, 0.45% saline at 100 mL/h. Patient's pain improved.  He rates pain at 5/10.  Patient does not warrant inpatient admission on today.  He is alert, oriented, and ambulating without assistance. Patient will discharge home in a hemodynamically stable condition.  Discharge instructions: Resume all home medications.   Follow up with PCP as previously  scheduled.   Discussed the importance of drinking 64 ounces of water daily, dehydration of red blood cells may lead further sickling.   Avoid all stressors that precipitate sickle cell pain crisis.     The patient was given clear instructions to go to ER or return to medical center if symptoms do not improve, worsen or new problems develop.  Physical Exam at Discharge:  BP (!) 122/57 (BP Location: Right Arm)   Pulse 73   Temp 98.8 F (37.1 C) (Temporal)   Resp 11   SpO2 95%   Physical Exam Constitutional:      Appearance: Normal appearance.  Cardiovascular:     Rate and Rhythm: Normal rate and regular rhythm.  Pulmonary:     Effort: Pulmonary effort is normal.  Neurological:     Mental Status: He is alert.      Disposition at Discharge: Discharge disposition: 01-Home or Self Care       Discharge Orders: Discharge Instructions    Discharge patient   Complete by: As directed    Discharge disposition: 01-Home or Self Care   Discharge patient date: 01/14/2020      Condition  at Discharge:   Stable  Time spent on Discharge:  Greater than 30 minutes.  Signed:  [name]  APRN, MSN, FNP-C [facility] [address] [place], [place] [address] [phone]  01/14/2020, 4:15 PM

## 2020-01-14 NOTE — H&P (Addendum)
[facility] History and Physical   Date: 01/14/2020  Patient name: Timothy Marks Medical record number: [id number] Date of birth: [birth year] Age: 29 y.o. Gender: male PCP: No primary care provider on file.  Attending physician: Tresa Garter, MD  Chief Complaint: Sickle cell pain  History of Present Illness: Timothy Marks is a 29 year old male with a medical history significant for sickle cell disease, chronic pain syndrome, opiate dependence and tolerance, and history of anemia of chronic disease presents with complaints of low back pain that is consistent with his previous pain crisis.  Patient says that pain intensity has been elevated over the past several days and unrelieved by his home medications.  He last had Percocet 10-325 mg on last night without sustained relief.  He says that he is currently out of medication.  Current pain intensity is 9/10 characterized as constant and aching.  Patient denies any subjective fevers, chills, shortness of breath, headache.  No nausea, vomiting, diarrhea, or urinary symptoms.  No sick contacts, recent travel, or exposure to COVID-19.   Meds: Medications Prior to Admission  Medication Sig Dispense Refill Last Dose  . acetaminophen (TYLENOL) 500 MG tablet Take 500-1,000 mg by mouth every 6 (six) hours as needed for mild pain or headache.   01/14/2020 at Unknown time  . cholecalciferol (VITAMIN D) 1000 units tablet Take 2 tablets (2,000 Units total) by mouth daily. 30 tablet 6 01/14/2020 at Unknown time  . folic acid (FOLVITE) 1 MG tablet Take 1 tablet (1 mg total) by mouth daily. 90 tablet 2 01/14/2020 at Unknown time  . gabapentin (NEURONTIN) 300 MG capsule Take 1 capsule (300 mg total) by mouth 3 (three) times daily. 90 capsule 5 01/14/2020 at Unknown time  . hydroxyurea (HYDREA) 500 MG capsule Take 1 capsule (500 mg total) by mouth 2 (two) times daily. May take with food to minimize GI side effects. (Patient taking  differently: Take 500 mg by mouth 2 (two) times daily. ) 180 capsule 2 01/14/2020 at Unknown time  . oxyCODONE-acetaminophen (PERCOCET) 10-325 MG tablet Take 1 tablet by mouth every 6 (six) hours as needed for up to 15 days for pain. 60 tablet 0 01/14/2020 at Unknown time  . OXYCONTIN 20 MG 12 hr tablet Take 1 tablet (20 mg total) by mouth every 12 (twelve) hours. 60 tablet 0 01/14/2020 at Unknown time  . Nutritional Supplements (ENSURE ACTIVE HIGH PROTEIN) LIQD Take 1 each by mouth 3 (three) times daily between meals. #90 cans of Ensure (Patient taking differently: Take 1 each by mouth See admin instructions. 5 times daily) 237 mL 11   . rivaroxaban (XARELTO) 20 MG TABS tablet Take 1 tablet (20 mg total) by mouth daily with supper. 68 tablet 0     Allergies: Buprenorphine hcl, Levaquin [levofloxacin], Meperidine, Morphine, Morphine and related, Toradol [ketorolac tromethamine], Tramadol, Vancomycin, and Zosyn [piperacillin sod-tazobactam so] Past Medical History:  Diagnosis Date  . Depression   . Generalized weakness 03/31/2019  . 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 Sickle cell anemia (HCC)   . Vitamin D deficiency y-6   Past Surgical History:  Procedure Laterality Date  . CHOLECYSTECTOMY     Family History  Problem Relation Age of Onset  . Sickle cell trait Mother   . Sickle cell trait Father    Social History   Socioeconomic History  . Marital status: Single    Spouse name: Not on file  .  Number of children: Not on file  . Years of education: Not on file  . Highest education level: Not on file  Occupational History  . Not on file  Tobacco Use  . Smoking status: Never Smoker  . Smokeless tobacco: Never Used  Vaping Use  . Vaping Use: Never used  Substance and Sexual Activity  . Alcohol use: No  . Drug use: Not Currently    Types: Marijuana  . Sexual activity: Not on file  Other Topics Concern  . Not on  file  Social History Narrative  . Not on file   Social Determinants of Health   Financial Resource Strain:   . Difficulty of Paying Living Expenses: Not on file  Food Insecurity:   . Worried About Charity fundraiser in the Last Year: Not on file  . Ran Out of Food in the Last Year: Not on file  Transportation Needs:   . Lack of Transportation (Medical): Not on file  . Lack of Transportation (Non-Medical): Not on file  Physical Activity:   . Days of Exercise per Week: Not on file  . Minutes of Exercise per Session: Not on file  Stress:   . Feeling of Stress : Not on file  Social Connections:   . Frequency of Communication with Friends and Family: Not on file  . Frequency of Social Gatherings with Friends and Family: Not on file  . Attends Religious Services: Not on file  . Active Member of Clubs or Organizations: Not on file  . Attends Archivist Meetings: Not on file  . Marital Status: Not on file  Intimate Partner Violence:   . Fear of Current or Ex-Partner: Not on file  . Emotionally Abused: Not on file  . Physically Abused: Not on file  . Sexually Abused: Not on file   Review of Systems  Constitutional: Negative.   HENT: Negative.   Eyes: Negative.   Respiratory: Negative.   Cardiovascular: Negative.   Gastrointestinal: Negative.   Genitourinary: Negative.   Musculoskeletal: Positive for back pain and joint pain.  Skin: Negative.   Neurological: Negative.   Endo/Heme/Allergies: Negative.   Psychiatric/Behavioral: Negative.    Physical Exam: Blood pressure (!) 122/57, pulse 73, temperature 98.8 F (37.1 C), temperature source Temporal, resp. rate 11, SpO2 95 %. Physical Exam Constitutional:      Appearance: Normal appearance.  HENT:     Nose: Nose normal.     Mouth/Throat:     Mouth: Mucous membranes are moist.  Eyes:     Pupils: Pupils are equal, round, and reactive to light.  Cardiovascular:     Rate and Rhythm: Normal rate and regular rhythm.      Pulses: Normal pulses.  Pulmonary:     Effort: Pulmonary effort is normal.  Abdominal:     General: Abdomen is flat. Bowel sounds are normal.  Musculoskeletal:        General: Normal range of motion.  Skin:    General: Skin is warm.  Neurological:     General: No focal deficit present.     Mental Status: He is alert. Mental status is at baseline.  Psychiatric:        Mood and Affect: Mood normal.        Behavior: Behavior normal.        Thought Content: Thought content normal.        Judgment: Judgment normal.      Lab results: Results for orders placed or performed during  the hospital encounter of 01/14/20 (from the past 24 hour(s))  CBC WITH DIFFERENTIAL     Status: Abnormal   Collection Time: 01/14/20  9:55 AM  Result Value Ref Range   WBC 10.4 4.0 - 10.5 K/uL   RBC 2.97 (L) 4.22 - 5.81 MIL/uL   Hemoglobin 9.1 (L) 13.0 - 17.0 g/dL   HCT 25.4 (L) 39 - 52 %   MCV 85.5 80.0 - 100.0 fL   MCH 30.6 26.0 - 34.0 pg   MCHC 35.8 30.0 - 36.0 g/dL   RDW 19.3 (H) 11.5 - 15.5 %   Platelets 513 (H) 150 - 400 K/uL   nRBC 1.7 (H) 0.0 - 0.2 %   Neutrophils Relative % 64 %   Neutro Abs 6.6 1.7 - 7.7 K/uL   Lymphocytes Relative 21 %   Lymphs Abs 2.2 0.7 - 4.0 K/uL   Monocytes Relative 10 %   Monocytes Absolute 1.0 0.1 - 1.0 K/uL   Eosinophils Relative 4 %   Eosinophils Absolute 0.4 0.0 - 0.5 K/uL   Basophils Relative 0 %   Basophils Absolute 0.0 0.0 - 0.1 K/uL   Immature Granulocytes 1 %   Abs Immature Granulocytes 0.08 (H) 0.00 - 0.07 K/uL  Reticulocytes     Status: Abnormal   Collection Time: 01/14/20  9:55 AM  Result Value Ref Range   Retic Ct Pct 10.7 (H) 0.4 - 3.1 %   RBC. 2.96 (L) 4.22 - 5.81 MIL/uL   Retic Count, Absolute 315.8 (H) 19.0 - 186.0 K/uL   Immature Retic Fract 37.2 (H) 2.3 - 15.9 %  Comprehensive metabolic panel     Status: Abnormal   Collection Time: 01/14/20  9:55 AM  Result Value Ref Range   Sodium 137 135 - 145 mmol/L   Potassium 3.5 3.5 - 5.1  mmol/L   Chloride 103 98 - 111 mmol/L   CO2 25 22 - 32 mmol/L   Glucose, Bld 93 70 - 99 mg/dL   BUN 6 6 - 20 mg/dL   Creatinine, Ser 0.53 (L) 0.61 - 1.24 mg/dL   Calcium 8.9 8.9 - 10.3 mg/dL   Total Protein 7.6 6.5 - 8.1 g/dL   Albumin 4.5 3.5 - 5.0 g/dL   AST 24 15 - 41 U/L   ALT 9 0 - 44 U/L   Alkaline Phosphatase 67 38 - 126 U/L   Total Bilirubin 3.4 (H) 0.3 - 1.2 mg/dL   GFR, Estimated >60 >60 mL/min   Anion gap 9 5 - 15    Imaging results:  No results found.   Assessment & Plan: Patient admitted to sickle cell day infusion center for management of pain crisis.  Patient is opiate tolerant Initiate IV dilaudid PCA. Settings of 0.5 mg, 10 minute lockout, and 2 mg/hr IV fluids, 0.45% saline at 100 ml/hr Toradol 15 mg IV times one dose Tylenol 1000 mg by mouth times one dose Review CBC with differential, complete metabolic panel, and reticulocytes as results become available. Baseline hemoglobin is 9-10 Pain intensity will be reevaluated in context of functioning and relationship to baseline as care progresses If pain intensity remains elevated and/or sudden change in hemodynamic stability transition to inpatient services for higher level of care.      [name]  APRN, MSN, FNP-C [facility] [address] [place], [place] [address] [phone]  01/14/2020, 4:26 PM

## 2020-01-14 NOTE — Progress Notes (Signed)
Patient admitted to the day infusion hospital for sickle cell pain crisis. Initially, patient reported back pain rated 9/10. For pain management, patient placed on Dilaudid PCA, given Tylenol, Toradol and hydrated with IV fluids. At discharge, patient rated pain at 5/10. Vital signs stable. Discharge instructions given. Patient alert, oriented and ambulatory at discharge.

## 2020-01-14 NOTE — Telephone Encounter (Signed)
Patient called requesting to come to the day hospital for sickle cell pain. Patient reports back pain rated 9/10. Reports taking Percocet yesterday but is now out of medication. Reports that he may be able to refill Percocet prescription on December 2nd when he sees his provider. COVID-19 screening done and patient denies all symptoms and exposures. Denies fever, chest pain, nausea, vomiting, diarrhea, abdominal pain and priapism. Admits to having transportation at discharge without driving self. [place], [place] notified. Patient can come to the day hospital for pain management. Patient advised and expresses an understanding.

## 2020-01-14 NOTE — Discharge Instructions (Signed)
Sickle Cell Anemia, Adult  Sickle cell anemia is a condition where your red blood cells are shaped like sickles. Red blood cells carry oxygen through the body. Sickle-shaped cells do not live as long as normal red blood cells. They also clump together and block blood from flowing through the blood vessels. This prevents the body from getting enough oxygen. Sickle cell anemia causes organ damage and pain. It also increases the risk of infection. Follow these instructions at home: Medicines  Take over-the-counter and prescription medicines only as told by your doctor.  If you were prescribed an antibiotic medicine, take it as told by your doctor. Do not stop taking the antibiotic even if you start to feel better.  If you develop a fever, do not take medicines to lower the fever right away. Tell your doctor about the fever. Managing pain, stiffness, and swelling  Try these methods to help with pain: ? Use a heating pad. ? Take a warm bath. ? Distract yourself, such as by watching TV. Eating and drinking  Drink enough fluid to keep your pee (urine) clear or pale yellow. Drink more in hot weather and during exercise.  Limit or avoid alcohol.  Eat a healthy diet. Eat plenty of fruits, vegetables, whole grains, and lean protein.  Take vitamins and supplements as told by your doctor. Traveling  When traveling, keep these with you: ? Your medical information. ? The names of your doctors. ? Your medicines.  If you need to take an airplane, talk to your doctor first. Activity  Rest often.  Avoid exercises that make your heart beat much faster, such as jogging. General instructions  Do not use products that have nicotine or tobacco, such as cigarettes and e-cigarettes. If you need help quitting, ask your doctor.  Consider wearing a medical alert bracelet.  Avoid being in high places (high altitudes), such as mountains.  Avoid very hot or cold temperatures.  Avoid places where the  temperature changes a lot.  Keep all follow-up visits as told by your doctor. This is important. Contact a doctor if:  A joint hurts.  Your feet or hands hurt or swell.  You feel tired (fatigued). Get help right away if:  You have symptoms of infection. These include: ? Fever. ? Chills. ? Being very tired. ? Irritability. ? Poor eating. ? Throwing up (vomiting).  You feel dizzy or faint.  You have new stomach pain, especially on the left side.  You have a an erection (priapism) that lasts more than 4 hours.  You have numbness in your arms or legs.  You have a hard time moving your arms or legs.  You have trouble talking.  You have pain that does not go away when you take medicine.  You are short of breath.  You are breathing fast.  You have a long-term cough.  You have pain in your chest.  You have a bad headache.  You have a stiff neck.  Your stomach looks bloated even though you did not eat much.  Your skin is pale.  You suddenly cannot see well. Summary  Sickle cell anemia is a condition where your red blood cells are shaped like sickles.  Follow your doctor's advice on ways to manage pain, food to eat, activities to do, and steps to take for safe travel.  Get medical help right away if you have any signs of infection, such as a fever. This information is not intended to replace advice given to you by   your health care provider. Make sure you discuss any questions you have with your health care provider. Document Revised: 05/25/2018 Document Reviewed: 03/08/2016 Elsevier Patient Education  2020 Elsevier Inc.  

## 2020-01-16 ENCOUNTER — Encounter: Payer: Self-pay | Admitting: Internal Medicine

## 2020-01-16 ENCOUNTER — Other Ambulatory Visit: Payer: Self-pay

## 2020-01-16 ENCOUNTER — Ambulatory Visit (INDEPENDENT_AMBULATORY_CARE_PROVIDER_SITE_OTHER): Payer: Medicare Other | Admitting: Internal Medicine

## 2020-01-16 VITALS — BP 112/61 | HR 84 | Temp 99.1°F | Ht 74.0 in | Wt 119.2 lb

## 2020-01-16 DIAGNOSIS — D571 Sickle-cell disease without crisis: Secondary | ICD-10-CM

## 2020-01-16 DIAGNOSIS — Z79899 Other long term (current) drug therapy: Secondary | ICD-10-CM | POA: Diagnosis not present

## 2020-01-16 DIAGNOSIS — D5700 Hb-SS disease with crisis, unspecified: Secondary | ICD-10-CM | POA: Diagnosis not present

## 2020-01-16 DIAGNOSIS — G894 Chronic pain syndrome: Secondary | ICD-10-CM | POA: Diagnosis not present

## 2020-01-16 DIAGNOSIS — M545 Low back pain, unspecified: Secondary | ICD-10-CM

## 2020-01-16 LAB — POCT URINALYSIS DIP (CLINITEK)
Glucose, UA: NEGATIVE mg/dL
Ketones, POC UA: NEGATIVE mg/dL
Leukocytes, UA: NEGATIVE
Nitrite, UA: NEGATIVE
POC PROTEIN,UA: 100 — AB
Spec Grav, UA: 1.02 (ref 1.010–1.025)
Urobilinogen, UA: 2 E.U./dL — AB
pH, UA: 7 (ref 5.0–8.0)

## 2020-01-16 MED ORDER — OXYCODONE-ACETAMINOPHEN 10-325 MG PO TABS: 1.0000 | ORAL_TABLET | Freq: Four times a day (QID) | ORAL | 0 refills | Status: DC | PRN

## 2020-01-16 MED ORDER — HYDROXYZINE HCL 10 MG PO TABS: 10.0000 mg | ORAL_TABLET | Freq: Three times a day (TID) | ORAL | 0 refills | Status: DC | PRN

## 2020-01-16 NOTE — Patient Instructions (Signed)
Morphine sustained-release tablets What is this medicine? MORPHINE (MOR feen) is a pain reliever. It is used to treat moderate to severe pain that lasts for more than a few days. This medicine may be used for other purposes; ask your health care provider or pharmacist if you have questions. COMMON BRAND NAME(S): ARYMO ER, MORPHABOND, MS Contin, Oramorph SR What should I tell my health care provider before I take this medicine? They need to know if you have any of these conditions:  brain tumor  drug abuse or addiction  head injury  heart disease  if you often drink alcohol  liver disease  lung or breathing disease, like asthma  problems urinating  seizures  stomach or intestine problems  taken an MAOI like Carbex, Eldepryl, Marplan, Nardil, or Parnate in the last 14 days  an unusual or allergic reaction to morphine, other medications, foods, dyes, or preservatives  pregnant or trying to get pregnant  breast-feeding How should I use this medicine? Take this medicine by mouth with a glass of water. Do not break, crush, or chew the medicine. Do not take a tablet that is not whole. A broken or crushed tablet can be very dangerous. You may get too much medicine. If the medicine upsets your stomach, take it with food or milk. Follow the directions on the prescription label. Take the medicine at the same time each day. Do not take more medicine than you are told to take. A special MedGuide will be given to you by the pharmacist with each prescription and refill. Be sure to read this information carefully each time. Talk to your pediatrician regarding the use of this medicine in children. Special care may be needed. Overdosage: If you think you have taken too much of this medicine contact a poison control center or emergency room at once. NOTE: This medicine is only for you. Do not share this medicine with others. What if I miss a dose? If you miss a dose, take it as soon as you  can. If it is almost time for your next dose, take only that dose. Do not take double or extra doses. What may interact with this medicine? This medicine may interact with the following medications:  alcohol  antihistamines for allergy, cough and cold  atropine  certain medicines for anxiety or sleep  certain medicines for bladder problems like oxybutynin, tolterodine  certain medicines for depression like amitriptyline, fluoxetine, sertraline  certain medicines for Parkinson's disease like benztropine, trihexyphenidyl  certain medicines for seizures like phenobarbital, primidone  certain medicines for stomach problems like dicyclomine, hyoscyamine  certain medicines for travel sickness like scopolamine  cimetidine  general anesthetics like halothane, isoflurane, methoxyflurane, propofol  ipratropium  local anesthetics like lidocaine, pramoxine, tetracaine  MAOIs like Carbex, Eldepryl, Marplan, Nardil, and Parnate  medicines that relax muscles for surgery  other narcotic medicines for pain or cough  phenothiazines like chlorpromazine, mesoridazine, prochlorperazine, thioridazine This list may not describe all possible interactions. Give your health care provider a list of all the medicines, herbs, non-prescription drugs, or dietary supplements you use. Also tell them if you smoke, drink alcohol, or use illegal drugs. Some items may interact with your medicine. What should I watch for while using this medicine? Tell your health care provider if your pain does not go away, if it gets worse, or if you have new or a different type of pain. You may develop tolerance to this drug. Tolerance means that you will need a higher dose of  the drug for pain relief. Tolerance is normal and is expected if you take this drug for a long time. There are different types of narcotic drugs (opioids) for pain. If you take more than one type at the same time, you may have more side effects. Give  your health care provider a list of all drugs you use. He or she will tell you how much drug to take. Do not take more drug than directed. Get emergency help right away if you have problems breathing. Do not suddenly stop taking your drug because you may develop a severe reaction. Your body becomes used to the drug. This does NOT mean you are addicted. Addiction is a behavior related to getting and using a drug for a nonmedical reason. If you have pain, you have a medical reason to take pain drug. Your health care provider will tell you how much drug to take. If your health care provider wants you to stop the drug, the dose will be slowly lowered over time to avoid any side effects. Talk to your health care provider about naloxone and how to get it. Naloxone is an emergency drug used for an opioid overdose. An overdose can happen if you take too much opioid. It can also happen if an opioid is taken with some other drugs or substances, like alcohol. Know the symptoms of an overdose, like trouble breathing, unusually tired or sleepy, or not being able to respond or wake up. Make sure to tell caregivers and close contacts where it is stored. Make sure they know how to use it. After naloxone is given, you must get emergency help right away. Naloxone is a temporary treatment. Repeat doses may be needed. You may get drowsy or dizzy. Do not drive, use machinery, or do anything that needs mental alertness until you know how this drug affects you. Do not stand up or sit up quickly, especially if you are an older patient. This reduces the risk of dizzy or fainting spells. Alcohol may interfere with the effect of this drug. Avoid alcoholic drinks. This drug will cause constipation. If you do not have a bowel movement for 3 days, call your health care provider. Your mouth may get dry. Chewing sugarless gum or sucking hard candy and drinking plenty of water may help. Contact your health care provider if the problem does not  go away or is severe. The tablet shell for some brands of this drug does not dissolve. This is normal. The tablet shell may appear whole in the stool. This is not a cause for concern. What side effects may I notice from receiving this medicine? Side effects that you should report to your doctor or health care professional as soon as possible:  allergic reactions like skin rash, itching or hives, swelling of the face, lips, or tongue  breathing problems  confusion  seizures  signs and symptoms of low blood pressure like dizziness; feeling faint or lightheaded, falls; unusually weak or tired  trouble passing urine or change in the amount of urine Side effects that usually do not require medical attention (report to your doctor or health care professional if they continue or are bothersome):  constipation  dry mouth  nausea, vomiting  tiredness This list may not describe all possible side effects. Call your doctor for medical advice about side effects. You may report side effects to FDA at 1-800-FDA-1088. Where should I keep my medicine? Keep out of the reach of children. This medicine can be abused.  Keep your medicine in a safe place to protect it from theft. Do not share this medicine with anyone. Selling or giving away this medicine is dangerous and is against the law. Store at room temperature between 15 and 30 degrees C (59 and 86 degrees F). Protect from light. This medicine may cause harm and death if it is taken by other adults, children, or pets. Return medicine that has not been used to an official disposal site. Contact the DEA at (409) 526-9798 or your city/county government to find a site. If you cannot return the medicine, flush it down the toilet. Do not use the medicine after the expiration date. NOTE: This sheet is a summary. It may not cover all possible information. If you have questions about this medicine, talk to your doctor, pharmacist, or health care provider.   2020 Elsevier/Gold Standard (2018-09-10 10:30:38) Sickle Cell Anemia, Adult  Sickle cell anemia is a condition in which red blood cells have an abnormal "sickle" shape. Red blood cells carry oxygen through the body. Sickle-shaped red blood cells do not live as long as normal red blood cells. They also clump together and block blood from flowing through the blood vessels. This condition prevents the body from getting enough oxygen. Sickle cell anemia causes organ damage and pain. It also increases the risk of infection. What are the causes? This condition is caused by a gene that is passed from parent to child (inherited). Receiving two copies of the gene causes the disease. Receiving one copy causes the "trait," which means that symptoms are milder or not present. What increases the risk? This condition is more likely to develop if your ancestors were from [place], the [place], [place] or [place], the [place], [place], or the [place]. What are the signs or symptoms? Symptoms of this condition include:  Episodes of pain (crises), especially in the hands and feet, joints, back, chest, or abdomen. The pain can be triggered by: ? An illness, especially if there is dehydration. ? Doing an activity with great effort (overexertion). ? Exposure to extreme temperature changes. ? High altitude.  Fatigue.  Shortness of breath or difficulty breathing.  Dizziness.  Pale skin or yellowed skin (jaundice).  Frequent bacterial infections.  Pain and swelling in the hands and feet (hand-food syndrome).  Prolonged, painful erection of the penis (priapism).  Acute chest syndrome. Symptoms of this include: ? Chest pain. ? Fever. ? Cough. ? Fast breathing.  Stroke.  Decreased activity.  Loss of appetite.  Change in behavior.  Headaches.  Seizures.  Vision changes.  Skin ulcers.  Heart disease.  High blood pressure.  Gallstones.  Liver and kidney problems. How is  this diagnosed? This condition is diagnosed with blood tests that check for the gene that causes this condition. How is this treated? There is no cure for most cases of this condition. Treatment focuses on managing your symptoms and preventing complications of the disease. Your health care provider will work with you to identify the best treatment options for you based on an assessment of your condition. Treatment may include:  Medicines, including: ? Pain medicines. ? Antibiotic medicines for infection. ? Medicines to increase the production of a protein in red blood cells that helps carry oxygen in the body (hemoglobin).  Fluids to treat pain and swelling.  Oxygen to treat acute chest syndrome.  Blood transfusions to treat symptoms such as fatigue, stroke, and acute chest syndrome.  Massage and physical therapy for pain.  Regular tests to monitor  your condition, such as blood tests, X-rays, CT scans, MRI scans, ultrasounds, and lung function tests. These should be done every 3-12 months, depending on your age.  Hematopoietic stem cell transplant. This is a procedure to replace abnormal stem cells with healthy stem cells from a donor's bone marrow. Stem cells are cells that can develop into blood cells, and bone marrow is the spongy tissue inside the bones. Follow these instructions at home: Medicines  Take over-the-counter and prescription medicines only as told by your health care provider.  If you were prescribed an antibiotic medicine, take it as told by your health care provider. Do not stop taking the antibiotic even if you start to feel better.  If you develop a fever, do not take medicines to reduce the fever right away. This could cover up another problem. Notify your health care provider. Managing pain, stiffness, and swelling  Try these methods to help ease your pain: ? Using a heating pad. ? Taking a warm bath. ? Distracting yourself, such as by watching TV. Eating and  drinking  Drink enough fluid to keep your urine clear or pale yellow. Drink more in hot weather and during exercise.  Limit or avoid drinking alcohol.  Eat a balanced and nutritious diet. Eat plenty of fruits, vegetables, whole grains, and lean protein.  Take vitamins and supplements as directed by your health care provider. Traveling  When traveling, keep these with you: ? Your medical information. ? The names of your health care providers. ? Your medicines.  If you have to travel by air, ask about precautions you should take. Activity  Get plenty of rest.  Avoid activities that will lower your oxygen levels, such as exercising vigorously. General instructions  Do not use any products that contain nicotine or tobacco, such as cigarettes and e-cigarettes. They lower blood oxygen levels. If you need help quitting, ask your health care provider.  Consider wearing a medical alert bracelet.  Avoid high altitudes.  Avoid extreme temperatures and extreme temperature changes.  Keep all follow-up visits as told by your health care provider. This is important. Contact a health care provider if:  You develop joint pain.  Your feet or hands swell or have pain.  You have fatigue. Get help right away if:  You have symptoms of infection. These include: ? Fever. ? Chills. ? Extreme tiredness. ? Irritability. ? Poor eating. ? Vomiting.  You feel dizzy or faint.  You have new abdominal pain, especially on the left side near the stomach area.  You develop priapism.  You have numbness in your arms or legs or have trouble moving them.  You have trouble talking.  You develop pain that cannot be controlled with medicine.  You become short of breath.  You have rapid breathing.  You have a persistent cough.  You have pain in your chest.  You develop a severe headache or stiff neck.  You feel bloated without eating or after eating a small amount of food.  Your skin is  pale.  You suddenly lose vision. Summary  Sickle cell anemia is a condition in which red blood cells have an abnormal "sickle" shape. This disease can cause organ damage and chronic pain, and it can raise your risk of infection.  Sickle cell anemia is a genetic disorder.  Treatment focuses on managing your symptoms and preventing complications of the disease.  Get medical help right away if you have any signs of infection, such as a fever. This information is  not intended to replace advice given to you by your health care provider. Make sure you discuss any questions you have with your health care provider. Document Revised: 07/18/2018 Document Reviewed: 03/08/2016 Elsevier Patient Education  2020 [employer].

## 2020-01-16 NOTE — Progress Notes (Signed)
Needs RF on Percocet   Reports itching w/ Oxycontin x 1-2 months, wants to discuss changing to another med

## 2020-01-16 NOTE — Progress Notes (Signed)
Timothy Marks, is a 29 y.o. male  KGY:[id number]  WYO:[id number]  DOB - 07/12/1990  Chief Complaint  Patient presents with  . Follow-up      Subjective:   Timothy Marks is a 29 y.o. male with a medical history significant for sickle cell disease, hemoglobin SS, major depression, and vitamin D deficiency who here today for a routine follow up visit and medication management.  Patient has no new complaint today, he claims he is doing well but would like to discuss his long-acting pain medications.  He claims the medication causes itching and he would like to discuss possibility of changing to another.  But patient has been on this medication for years with no significant complaint.  Patient has not tried anything for itching.  He agrees that both his long and short acting helps to control his pain when he takes the medications.  He denies any fever.  Patient has No headache, No chest pain, No abdominal pain - No Nausea, No new weakness tingling or numbness, No Cough - SOB.  Patient continues to smoke cigarettes or marijuana regularly, however his working on quitting, currently adopting the practice of "avoiding friends that smoke".  Problem  Medication Management    ALLERGIES: Allergies  Allergen Reactions  . Buprenorphine Hcl Hives  . Levaquin [Levofloxacin] Itching  . Meperidine Rash  . Morphine Hives  . Morphine And Related Hives  . Toradol [Ketorolac Tromethamine] Itching  . Tramadol Hives  . Vancomycin Itching  . Zosyn [Piperacillin Sod-Tazobactam So] Itching, Rash and Other (See Comments)    Has taken rocephin in the past    PAST MEDICAL HISTORY: Past Medical History:  Diagnosis Date  . Depression   . Generalized weakness 03/31/2019  . 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 Sickle cell anemia (HCC)   . Vitamin D deficiency y-6    MEDICATIONS AT HOME: Prior to Admission medications   Medication Sig Start  Date End Date Taking? Authorizing Provider  cholecalciferol (VITAMIN D) 1000 units tablet Take 2 tablets (2,000 Units total) by mouth daily. 12/13/17  Yes Azzie Glatter, FNP  folic acid (FOLVITE) 1 MG tablet Take 1 tablet (1 mg total) by mouth daily. 05/01/17  Yes Dorena Dew, FNP  gabapentin (NEURONTIN) 300 MG capsule Take 1 capsule (300 mg total) by mouth 3 (three) times daily. 03/12/19  Yes Dorena Dew, FNP  hydroxyurea (HYDREA) 500 MG capsule Take 1 capsule (500 mg total) by mouth 2 (two) times daily. May take with food to minimize GI side effects. Patient taking differently: Take 500 mg by mouth 2 (two) times daily.  05/01/17  Yes Dorena Dew, FNP  Nutritional Supplements (ENSURE ACTIVE HIGH PROTEIN) LIQD Take 1 each by mouth 3 (three) times daily between meals. #90 cans of Ensure Patient taking differently: Take 1 each by mouth See admin instructions. 5 times daily 11/13/18  Yes Dorena Dew, FNP  OXYCONTIN 20 MG 12 hr tablet Take 1 tablet (20 mg total) by mouth every 12 (twelve) hours. 12/23/19 01/22/20 Yes Vevelyn Francois, NP  rivaroxaban (XARELTO) 20 MG TABS tablet Take 1 tablet (20 mg total) by mouth daily with supper. 10/29/19 01/16/20 Yes Vevelyn Francois, NP  hydrOXYzine (ATARAX/VISTARIL) 10 MG tablet Take 1 tablet (10 mg total) by mouth 3 (three) times daily as needed for itching. 01/16/20   Tresa Garter, MD  oxyCODONE-acetaminophen (PERCOCET) 10-325 MG tablet Take 1 tablet by  mouth every 6 (six) hours as needed for up to 15 days for pain. 01/16/20 01/31/20  Tresa Garter, MD    Objective:   Vitals:   01/16/20 0826  BP: 112/61  Pulse: 84  Temp: 99.1 F (37.3 C)  TempSrc: Temporal  SpO2: 100%  Weight: 119 lb 3.2 oz (54.1 kg)  Height: 6\' 2"  (1.88 m)   Exam General appearance : Awake, alert, not in any distress. Speech Clear. Not toxic looking HEENT: Atraumatic and Normocephalic, pupils equally reactive to light and accomodation Neck: Supple, no  JVD. No cervical lymphadenopathy.  Chest: Good air entry bilaterally, no added sounds  CVS: S1 S2 regular, no murmurs.  Abdomen: Bowel sounds present, Non tender and not distended with no gaurding, rigidity or rebound. Extremities: B/L Lower Ext shows no edema, both legs are warm to touch Neurology: Awake alert, and oriented X 3, CN II-XII intact, Non focal Skin: No Rash  Assessment & Plan   1. Sickle cell Disease Continue Hydrea. We discussed the need for good hydration, monitoring of hydration status, avoidance of heat, cold, stress, and infection triggers. We discussed the risks and benefits of Hydrea, including bone marrow suppression, the possibility of GI upset, skin ulcers, hair thinning, and teratogenicity. The patient was reminded of the need to seek medical attention for any symptoms of bleeding, anemia, or infection. Continue folic acid 1 mg daily to prevent aplastic bone marrow crises.  Pulmonary evaluation - Patient denies severe recurrent wheezes, shortness of breath with exercise, or persistent cough. If these symptoms develop, pulmonary function tests with spirometry will be ordered, and if abnormal, plan on referral to Pulmonology for further evaluation.  Eye - High risk of proliferative retinopathy. Annual eye exam with retinal exam recommended to patient, the patient has had eye exam this year.  Immunization status - Yearly influenza vaccination is recommended, as well as being up to date with Meningococcal and Pneumococcal vaccines.   I personally performed the service.   Patient voiced understanding and agreeable to plan of care. No barriers to understanding were noted.  - POCT URINALYSIS DIP (CLINITEK)  2. Medication management Itching with OxyContin Prescribed Atarax 10 mg tablet p.o. 3 times daily, take 1 tablet 15 to 20 minutes before OxyContin. Patient counseled extensively, and educated on opiates and their side effects.  3. Acute bilateral low back pain  without sciatica  - POCT URINALYSIS DIP (CLINITEK) Continue home medication  4. Chronic pain syndrome We agreed on Opiate dose and amount of pills  per month. We discussed that pt is to receive Schedule II prescriptions only from our clinic. Pt is also aware that the prescription history is available to [place] online through the [facility] CSRS. Controlled substance agreement reviewed and signed. We reminded Omri that all patients receiving Schedule II narcotics must be seen for follow within one month of prescription being requested. We reviewed the terms of our pain agreement, including the need to keep medicines in a safe locked location away from children or pets, and the need to report excess sedation or constipation, measures to avoid constipation, and policies related to early refills and stolen prescriptions. According to the [place] [facility] program, we have reviewed details related to analgesia, adverse effects and aberrant behaviors.  - oxyCODONE-acetaminophen (PERCOCET) 10-325 MG tablet; Take 1 tablet by mouth every 6 (six) hours as needed for up to 15 days for pain.  Dispense: 60 tablet; Refill: 0  5. Tobacco use disorder:  Zyir was counseled on the  dangers of tobacco use, and was advised to quit. Reviewed strategies to maximize success, including removing cigarettes and smoking materials from environment, stress management and support of family/friends.  Return in about 6 weeks (around 02/27/2020), or if symptoms worsen or fail to improve, for Sickle Cell Disease/Pain.  The patient was given clear instructions to go to ER or return to medical center if symptoms don't improve, worsen or new problems develop. The patient verbalized understanding. The patient was told to call to get lab results if they haven't heard anything in the next week.   This note has been created with Surveyor, quantity. Any transcriptional errors are unintentional.     Angelica Chessman, MD, MHA, Karilyn Cota, [place] and [place], [place]   01/16/2020, 9:53 AM

## 2020-01-22 ENCOUNTER — Observation Stay (HOSPITAL_COMMUNITY)
Admission: AD | Admit: 2020-01-22 | Discharge: 2020-01-23 | Disposition: A | Discharge status: Home / Self Care | Payer: Medicare Other | Source: Ambulatory Visit | Attending: Internal Medicine | Admitting: Internal Medicine

## 2020-01-22 ENCOUNTER — Telehealth: Payer: Self-pay | Admitting: Internal Medicine

## 2020-01-22 ENCOUNTER — Other Ambulatory Visit: Payer: Self-pay

## 2020-01-22 ENCOUNTER — Encounter (HOSPITAL_COMMUNITY): Payer: Self-pay

## 2020-01-22 ENCOUNTER — Emergency Department (HOSPITAL_COMMUNITY)
Admission: EM | Admit: 2020-01-22 | Discharge: 2020-01-22 | Disposition: A | Discharge status: Home / Self Care | Payer: Medicare Other | Source: Home / Self Care | Attending: Emergency Medicine | Admitting: Emergency Medicine

## 2020-01-22 ENCOUNTER — Encounter (HOSPITAL_COMMUNITY): Payer: Self-pay | Admitting: Family Medicine

## 2020-01-22 DIAGNOSIS — M545 Low back pain, unspecified: Secondary | ICD-10-CM | POA: Diagnosis present

## 2020-01-22 DIAGNOSIS — Z79891 Long term (current) use of opiate analgesic: Secondary | ICD-10-CM

## 2020-01-22 DIAGNOSIS — Z23 Encounter for immunization: Secondary | ICD-10-CM | POA: Insufficient documentation

## 2020-01-22 DIAGNOSIS — F332 Major depressive disorder, recurrent severe without psychotic features: Secondary | ICD-10-CM | POA: Diagnosis not present

## 2020-01-22 DIAGNOSIS — G894 Chronic pain syndrome: Secondary | ICD-10-CM

## 2020-01-22 DIAGNOSIS — D5700 Hb-SS disease with crisis, unspecified: Secondary | ICD-10-CM | POA: Insufficient documentation

## 2020-01-22 DIAGNOSIS — Z79899 Other long term (current) drug therapy: Secondary | ICD-10-CM | POA: Diagnosis not present

## 2020-01-22 DIAGNOSIS — G8929 Other chronic pain: Secondary | ICD-10-CM | POA: Diagnosis present

## 2020-01-22 DIAGNOSIS — U071 COVID-19: Secondary | ICD-10-CM | POA: Diagnosis not present

## 2020-01-22 DIAGNOSIS — E43 Unspecified severe protein-calorie malnutrition: Secondary | ICD-10-CM | POA: Diagnosis present

## 2020-01-22 DIAGNOSIS — Z86718 Personal history of other venous thrombosis and embolism: Secondary | ICD-10-CM

## 2020-01-22 LAB — RESP PANEL BY RT-PCR (FLU A&B, COVID) ARPGX2
Influenza A by PCR: NEGATIVE
Influenza B by PCR: NEGATIVE
SARS Coronavirus 2 by RT PCR: POSITIVE — AB

## 2020-01-22 MED ORDER — NALOXONE HCL 0.4 MG/ML IJ SOLN: 0.4000 mg | INTRAMUSCULAR | Status: DC | PRN

## 2020-01-22 MED ORDER — ALBUTEROL SULFATE HFA 108 (90 BASE) MCG/ACT IN AERS: 2.0000 | INHALATION_SPRAY | Freq: Four times a day (QID) | RESPIRATORY_TRACT | Status: DC | PRN

## 2020-01-22 MED ORDER — SODIUM CHLORIDE 0.9 % IV SOLN
Freq: Once | INTRAVENOUS | Status: AC
  Filled 2020-01-22: qty 20

## 2020-01-22 MED ORDER — SODIUM CHLORIDE 0.9 % IV SOLN: 100.0000 mg | Freq: Every day | INTRAVENOUS | Status: DC

## 2020-01-22 MED ORDER — ENSURE ENLIVE PO LIQD: Freq: Three times a day (TID) | ORAL | Status: DC

## 2020-01-22 MED ORDER — SODIUM CHLORIDE 0.9% FLUSH: 9.0000 mL | INTRAVENOUS | Status: DC | PRN

## 2020-01-22 MED ORDER — HYDROMORPHONE HCL 1 MG/ML IJ SOLN
1.0000 mg | Freq: Once | INTRAMUSCULAR | Status: AC
  Administered 2020-01-22: 1 mg via INTRAVENOUS
  Filled 2020-01-22: qty 1

## 2020-01-22 MED ORDER — OXYCODONE HCL ER 20 MG PO T12A
20.0000 mg | EXTENDED_RELEASE_TABLET | Freq: Two times a day (BID) | ORAL | Status: DC
  Administered 2020-01-22 – 2020-01-23 (×2): 20 mg via ORAL
  Filled 2020-01-22 (×2): qty 1

## 2020-01-22 MED ORDER — SODIUM CHLORIDE 0.9 % IV BOLUS
1000.0000 mL | Freq: Once | INTRAVENOUS | Status: AC
  Administered 2020-01-22: 1000 mL via INTRAVENOUS

## 2020-01-22 MED ORDER — HYDROMORPHONE 1 MG/ML IV SOLN
INTRAVENOUS | Status: DC
  Administered 2020-01-22: 30 mg via INTRAVENOUS
  Administered 2020-01-22: 11 mg via INTRAVENOUS
  Administered 2020-01-23: 1 mg via INTRAVENOUS
  Administered 2020-01-23: 30 mg via INTRAVENOUS
  Filled 2020-01-22 (×2): qty 30

## 2020-01-22 MED ORDER — SODIUM CHLORIDE 0.9 % IV SOLN: 200.0000 mg | Freq: Once | INTRAVENOUS | Status: DC

## 2020-01-22 MED ORDER — ONDANSETRON HCL 4 MG/2ML IJ SOLN
4.0000 mg | Freq: Four times a day (QID) | INTRAMUSCULAR | Status: DC | PRN
  Administered 2020-01-22: 4 mg via INTRAVENOUS
  Filled 2020-01-22: qty 2

## 2020-01-22 MED ORDER — KETOROLAC TROMETHAMINE 15 MG/ML IJ SOLN
15.0000 mg | Freq: Four times a day (QID) | INTRAMUSCULAR | Status: DC
  Administered 2020-01-22 – 2020-01-23 (×3): 15 mg via INTRAVENOUS
  Filled 2020-01-22 (×3): qty 1

## 2020-01-22 MED ORDER — DIPHENHYDRAMINE HCL 25 MG PO CAPS
25.0000 mg | ORAL_CAPSULE | ORAL | Status: DC | PRN
  Administered 2020-01-22: 25 mg via ORAL
  Filled 2020-01-22: qty 1

## 2020-01-22 MED ORDER — HYDROXYUREA 500 MG PO CAPS
500.0000 mg | ORAL_CAPSULE | Freq: Two times a day (BID) | ORAL | Status: DC
  Administered 2020-01-22 – 2020-01-23 (×2): 500 mg via ORAL
  Filled 2020-01-22 (×3): qty 1

## 2020-01-22 MED ORDER — GABAPENTIN 300 MG PO CAPS
300.0000 mg | ORAL_CAPSULE | Freq: Three times a day (TID) | ORAL | Status: DC
  Administered 2020-01-22 – 2020-01-23 (×2): 300 mg via ORAL
  Filled 2020-01-22 (×2): qty 1

## 2020-01-22 MED ORDER — DEXTROSE-NACL 5-0.45 % IV SOLN: INTRAVENOUS | Status: DC

## 2020-01-22 MED ORDER — RIVAROXABAN 20 MG PO TABS
20.0000 mg | ORAL_TABLET | Freq: Every day | ORAL | Status: DC
  Administered 2020-01-22: 20 mg via ORAL
  Filled 2020-01-22: qty 1

## 2020-01-22 MED ORDER — VITAMIN D3 25 MCG (1000 UNIT) PO TABS
2000.0000 [IU] | ORAL_TABLET | Freq: Every day | ORAL | Status: DC
  Administered 2020-01-23: 2000 [IU] via ORAL
  Filled 2020-01-22: qty 2

## 2020-01-22 MED ORDER — SENNOSIDES-DOCUSATE SODIUM 8.6-50 MG PO TABS
1.0000 | ORAL_TABLET | Freq: Two times a day (BID) | ORAL | Status: DC
  Filled 2020-01-22 (×2): qty 1

## 2020-01-22 MED ORDER — SODIUM CHLORIDE 0.9 % IV SOLN: 25.0000 mg | INTRAVENOUS | Status: DC | PRN

## 2020-01-22 MED ORDER — POLYETHYLENE GLYCOL 3350 17 G PO PACK: 17.0000 g | PACK | Freq: Every day | ORAL | Status: DC | PRN

## 2020-01-22 MED ORDER — ADULT MULTIVITAMIN W/MINERALS CH
1.0000 | ORAL_TABLET | Freq: Every day | ORAL | Status: DC
  Administered 2020-01-23: 1 via ORAL
  Filled 2020-01-22: qty 1

## 2020-01-22 MED ORDER — FOLIC ACID 1 MG PO TABS
1.0000 mg | ORAL_TABLET | Freq: Every day | ORAL | Status: DC
  Administered 2020-01-23: 1 mg via ORAL
  Filled 2020-01-22: qty 1

## 2020-01-22 MED ORDER — HYDROXYZINE HCL 10 MG PO TABS
10.0000 mg | ORAL_TABLET | Freq: Three times a day (TID) | ORAL | Status: DC | PRN
  Filled 2020-01-22: qty 1

## 2020-01-22 MED ORDER — KETOROLAC TROMETHAMINE 30 MG/ML IJ SOLN
15.0000 mg | Freq: Once | INTRAMUSCULAR | Status: AC
  Administered 2020-01-22: 15 mg via INTRAVENOUS
  Filled 2020-01-22: qty 1

## 2020-01-22 NOTE — Progress Notes (Signed)
Pharmacy COVID-19 Monoclonal Antibody Screening  Timothy Marks was identified as being not hospitalized with symptoms from Covid-19 on admission but an incidental positive PCR has been documented.  The patient may qualify for the use of monoclonal antibodies (mAB) for COVID-19 viral infection to prevent worsening symptoms stemming from Covid-19 infection.  The patient was identified based on a positive COVID-19 PCR and not requiring the use of supplemental oxygen at this time.  This patient meets the FDA criteria for Emergency Use Authorization of casirivimab/imdevimab or bamlanivimab/etesevimab.  Has a (+) direct SARS-CoV-2 viral test result  Is NOT hospitalized due to COVID-19  Is within 10 days of symptom onset  Has at least one of the high risk factor(s) for progression to severe COVID-19 and/or hospitalization as defined in EUA.  Specific high risk criteria : Sickle Cell Disease  Additionally: The patient has not had a positive COVID-19 PCR in the last 90 days.  The patient is unvaccinated against COVID-19.  Since the patient is unvaccinated and meets high risk criteria, the patient is eligible for mAB administration.   This eligibility and indication for treatment was discussed with the patient's physician: L. Hollis  Plan: Based on the above discussion, it was decided that the patient will receive one dose of the available COVID-19 mAB combination. Pharmacy will coordinate administration timing with patient's nurse. Recommended infusion monitoring parameters communicated to the nursing team.   Netta Cedars PharmD 01/22/2020  11:46 PM

## 2020-01-22 NOTE — H&P (Signed)
[facility] History and Physical   Date: 01/22/2020  Patient name: Timothy Marks Medical record number: [id number] Date of birth: [birth year] Age: 29 y.o. Gender: male PCP: Pcp, No  Attending physician: Tresa Garter, MD  Chief Complaint: Sickle cell pain       History of Present Illness: Timothy Marks is a 29 year old male with a medical history significant for sickle cell disease, chronic pain syndrome, opiate dependence and tolerance, oppositional defiance, and history of anemia of chronic disease presents complaining of low back pain and central chest pain that is consistent with his typical crisis.  Patient was treated and evaluated in the emergency department this a.m., agree with ER provider that patient was appropriate to transition to sickle cell day clinic for further pain management and extended observation.  Patient states that her pain intensity has been increased since last week and primarily to lower back.  He says that he awakened suddenly this a.m. with pain to central chest and low back characterized as sharp and constant.  He last had OxyContin on last night without sustained relief.  Patient was also treated in the sickle cell day clinic on 01/20/2020 for this problem.  He currently denies any subjective fevers, chills, headache, shortness of breath, persistent cough.  No urinary symptoms, nausea, vomiting, or diarrhea.  No sick contacts, recent travel, or exposure to COVID-19.  Meds: Medications Prior to Admission  Medication Sig Dispense Refill Last Dose  . cholecalciferol (VITAMIN D) 1000 units tablet Take 2 tablets (2,000 Units total) by mouth daily. 30 tablet 6   . folic acid (FOLVITE) 1 MG tablet Take 1 tablet (1 mg total) by mouth daily. 90 tablet 2   . gabapentin (NEURONTIN) 300 MG capsule Take 1 capsule (300 mg total) by mouth 3 (three) times daily. 90 capsule 5   . hydroxyurea (HYDREA) 500 MG capsule Take 1 capsule (500 mg total) by mouth 2  (two) times daily. May take with food to minimize GI side effects. (Patient taking differently: Take 500 mg by mouth 2 (two) times daily. ) 180 capsule 2   . hydrOXYzine (ATARAX/VISTARIL) 10 MG tablet Take 1 tablet (10 mg total) by mouth 3 (three) times daily as needed for itching. 30 tablet 0   . Nutritional Supplements (ENSURE ACTIVE HIGH PROTEIN) LIQD Take 1 each by mouth 3 (three) times daily between meals. #90 cans of Ensure (Patient taking differently: Take 1 each by mouth See admin instructions. 5 times daily) 237 mL 11   . oxyCODONE-acetaminophen (PERCOCET) 10-325 MG tablet Take 1 tablet by mouth every 6 (six) hours as needed for up to 15 days for pain. 60 tablet 0   . OXYCONTIN 20 MG 12 hr tablet Take 1 tablet (20 mg total) by mouth every 12 (twelve) hours. 60 tablet 0   . rivaroxaban (XARELTO) 20 MG TABS tablet Take 1 tablet (20 mg total) by mouth daily with supper. 68 tablet 0     Allergies: Buprenorphine hcl, Levaquin [levofloxacin], Meperidine, Morphine, Morphine and related, Toradol [ketorolac tromethamine], Tramadol, Vancomycin, and Zosyn [piperacillin sod-tazobactam so] Past Medical History:  Diagnosis Date  . Depression   . Generalized weakness 03/31/2019  . 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 Sickle cell anemia (HCC)   . Vitamin D deficiency y-6   Past Surgical History:  Procedure Laterality Date  . CHOLECYSTECTOMY     Family History  Problem Relation Age of Onset  . Sickle  cell trait Mother   . Sickle cell trait Father    Social History   Socioeconomic History  . Marital status: Single    Spouse name: Not on file  . Number of children: Not on file  . Years of education: Not on file  . Highest education level: Not on file  Occupational History  . Not on file  Tobacco Use  . Smoking status: Never Smoker  . Smokeless tobacco: Never Used  Vaping Use  . Vaping Use: Never used  Substance and Sexual  Activity  . Alcohol use: No  . Drug use: Not Currently    Types: Marijuana  . Sexual activity: Not on file  Other Topics Concern  . Not on file  Social History Narrative  . Not on file   Social Determinants of Health   Financial Resource Strain:   . Difficulty of Paying Living Expenses: Not on file  Food Insecurity:   . Worried About Charity fundraiser in the Last Year: Not on file  . Ran Out of Food in the Last Year: Not on file  Transportation Needs:   . Lack of Transportation (Medical): Not on file  . Lack of Transportation (Non-Medical): Not on file  Physical Activity:   . Days of Exercise per Week: Not on file  . Minutes of Exercise per Session: Not on file  Stress:   . Feeling of Stress : Not on file  Social Connections:   . Frequency of Communication with Friends and Family: Not on file  . Frequency of Social Gatherings with Friends and Family: Not on file  . Attends Religious Services: Not on file  . Active Member of Clubs or Organizations: Not on file  . Attends Archivist Meetings: Not on file  . Marital Status: Not on file  Intimate Partner Violence:   . Fear of Current or Ex-Partner: Not on file  . Emotionally Abused: Not on file  . Physically Abused: Not on file  . Sexually Abused: Not on file  Review of Systems  Constitutional: Negative.   Eyes: Negative.   Respiratory: Negative.   Cardiovascular: Positive for chest pain.  Gastrointestinal: Negative.   Genitourinary: Negative.   Musculoskeletal: Positive for back pain.  Neurological: Negative.   Endo/Heme/Allergies: Negative.   Psychiatric/Behavioral: Negative.    Physical Exam: There were no vitals taken for this visit. Physical Exam Constitutional:      Appearance: Normal appearance.  HENT:     Nose: Nose normal.  Eyes:     Pupils: Pupils are equal, round, and reactive to light.  Cardiovascular:     Rate and Rhythm: Normal rate and regular rhythm.     Pulses: Normal pulses.   Pulmonary:     Effort: Pulmonary effort is normal.  Abdominal:     General: Abdomen is flat. Bowel sounds are normal.  Skin:    General: Skin is warm.  Neurological:     General: No focal deficit present.     Mental Status: He is alert. Mental status is at baseline.  Psychiatric:        Mood and Affect: Mood normal.        Behavior: Behavior normal.        Thought Content: Thought content normal.        Judgment: Judgment normal.     Lab results: No results found for this or any previous visit (from the past 24 hour(s)).  Imaging results:  No results found.   Assessment &  Plan: Patient admitted to sickle cell day infusion center for management of pain crisis.  Patient is opiate tolerant Initiate IV dilaudid PCA. Settings of 0.5 mg, 10-minute lockout, and 3 mg/h IV fluids, 0.45% saline at 75 mg/h Toradol 15 mg IV times one dose Tylenol 1000 mg by mouth times one dose Reviewed CBC with differential, CMP, and reticulocytes, largely consistent with patient's baseline. Pain intensity will be reevaluated in context of functioning and relationship to baseline as care progresses If pain intensity remains elevated and/or sudden change in hemodynamic stability transition to inpatient services for higher level of care.   [name]  APRN, MSN, FNP-C [facility] [address] [place], [place] [address] [phone]  01/22/2020, 9:15 AM

## 2020-01-22 NOTE — Discharge Instructions (Addendum)
Go directly over to the sickle cell clinic I have spoke with a nurse practitioner there and they will take care of the

## 2020-01-22 NOTE — Discharge Instructions (Signed)
Sickle Cell Anemia, Adult  Sickle cell anemia is a condition where your red blood cells are shaped like sickles. Red blood cells carry oxygen through the body. Sickle-shaped cells do not live as long as normal red blood cells. They also clump together and block blood from flowing through the blood vessels. This prevents the body from getting enough oxygen. Sickle cell anemia causes organ damage and pain. It also increases the risk of infection. Follow these instructions at home: Medicines  Take over-the-counter and prescription medicines only as told by your doctor.  If you were prescribed an antibiotic medicine, take it as told by your doctor. Do not stop taking the antibiotic even if you start to feel better.  If you develop a fever, do not take medicines to lower the fever right away. Tell your doctor about the fever. Managing pain, stiffness, and swelling  Try these methods to help with pain: ? Use a heating pad. ? Take a warm bath. ? Distract yourself, such as by watching TV. Eating and drinking  Drink enough fluid to keep your pee (urine) clear or pale yellow. Drink more in hot weather and during exercise.  Limit or avoid alcohol.  Eat a healthy diet. Eat plenty of fruits, vegetables, whole grains, and lean protein.  Take vitamins and supplements as told by your doctor. Traveling  When traveling, keep these with you: ? Your medical information. ? The names of your doctors. ? Your medicines.  If you need to take an airplane, talk to your doctor first. Activity  Rest often.  Avoid exercises that make your heart beat much faster, such as jogging. General instructions  Do not use products that have nicotine or tobacco, such as cigarettes and e-cigarettes. If you need help quitting, ask your doctor.  Consider wearing a medical alert bracelet.  Avoid being in high places (high altitudes), such as mountains.  Avoid very hot or cold temperatures.  Avoid places where the  temperature changes a lot.  Keep all follow-up visits as told by your doctor. This is important. Contact a doctor if:  A joint hurts.  Your feet or hands hurt or swell.  You feel tired (fatigued). Get help right away if:  You have symptoms of infection. These include: ? Fever. ? Chills. ? Being very tired. ? Irritability. ? Poor eating. ? Throwing up (vomiting).  You feel dizzy or faint.  You have new stomach pain, especially on the left side.  You have a an erection (priapism) that lasts more than 4 hours.  You have numbness in your arms or legs.  You have a hard time moving your arms or legs.  You have trouble talking.  You have pain that does not go away when you take medicine.  You are short of breath.  You are breathing fast.  You have a long-term cough.  You have pain in your chest.  You have a bad headache.  You have a stiff neck.  Your stomach looks bloated even though you did not eat much.  Your skin is pale.  You suddenly cannot see well. Summary  Sickle cell anemia is a condition where your red blood cells are shaped like sickles.  Follow your doctor's advice on ways to manage pain, food to eat, activities to do, and steps to take for safe travel.  Get medical help right away if you have any signs of infection, such as a fever. This information is not intended to replace advice given to you by   your health care provider. Make sure you discuss any questions you have with your health care provider. Document Revised: 05/25/2018 Document Reviewed: 03/08/2016 Elsevier Patient Education  2020 Elsevier Inc.  

## 2020-01-22 NOTE — Progress Notes (Signed)
Patient positive for covid. Timothy Marks made aware.

## 2020-01-22 NOTE — H&P (Signed)
H&P  Patient Demographics:  Timothy Marks, is a 29 y.o. male  MRN: [id number]   DOB - Sep 18, 1990  Admit Date - 01/22/2020  Outpatient Primary MD for the patient is Pcp, No      HPI:  Timothy Marks is a 29 year old male with a medical history significant for sickle cell disease, chronic pain syndrome, opiate dependence and tolerance, oppositional defiance, and history of anemia of chronic disease presents complaining of low back pain and central chest pain that is consistent with his typical crisis.  Patient was treated and evaluated in the emergency department this a.m., agree with ER provider that patient was appropriate to transition to sickle cell day clinic for further pain management and extended observation.  Patient states that her pain intensity has been increased since last week and primarily to lower back.  He says that he awakened suddenly this a.m. with pain to central chest and low back characterized as sharp and constant.  He last had OxyContin on last night without sustained relief.  Patient was also treated in the sickle cell day clinic on 01/20/2020 for this problem.  He currently denies any subjective fevers, chills, headache, shortness of breath, persistent cough.  No urinary symptoms, nausea, vomiting, or diarrhea.  No sick contacts, recent travel, or exposure to COVID-19.  Sickle cell day hospital course:  Patient transferred to sickle cell day clinic from emergency department this a.m.  Received Dilaudid 1 mg IV, Toradol, and IV fluids prior to transfer.  Pain persisted despite medications. Vital signs show: BP 127/66 (BP Location: Left Arm)   Pulse 76   Temp 98.9 F (37.2 C) (Oral)   Resp 18   Ht 6\' 2"  (1.88 m)   Wt 54.1 kg   SpO2 99%   BMI 15.31 kg/m  CBC, CMP, and reticulocytes pending. COVID 19 pending. Patient admitted in observation for further management of sickle cell pain crisis.      Review of systems:  In addition to the HPI above, patient reports No  fever or chills Headache present.  No changes with vision or hearing No problems swallowing food or liquids No cough or shortness of breath No abdominal pain, No nausea or vomiting, Bowel movements are regular No blood in stool or urine No dysuria No new skin rashes or bruises No new joints pains-aches No new weakness, tingling, numbness in any extremity No recent weight gain or loss No polyuria, polydypsia or polyphagia No significant Mental Stressors   With Past History of the following :   Past Medical History:  Diagnosis Date  . Depression   . Generalized weakness 03/31/2019  . 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 Sickle cell anemia (HCC)   . Vitamin D deficiency y-6      Past Surgical History:  Procedure Laterality Date  . CHOLECYSTECTOMY       Social History:   Social History   Tobacco Use  . Smoking status: Never Smoker  . Smokeless tobacco: Never Used  Substance Use Topics  . Alcohol use: No     Lives - At home   Family History :   Family History  Problem Relation Age of Onset  . Sickle cell trait Mother   . Sickle cell trait Father      Home Medications:   Prior to Admission medications   Medication Sig Start Date End Date Taking? Authorizing Provider  cholecalciferol (VITAMIN D) 1000 units tablet Take 2 tablets (  2,000 Units total) by mouth daily. 12/13/17  Yes Azzie Glatter, FNP  folic acid (FOLVITE) 1 MG tablet Take 1 tablet (1 mg total) by mouth daily. 05/01/17  Yes Dorena Dew, FNP  gabapentin (NEURONTIN) 300 MG capsule Take 1 capsule (300 mg total) by mouth 3 (three) times daily. 03/12/19  Yes Dorena Dew, FNP  hydroxyurea (HYDREA) 500 MG capsule Take 1 capsule (500 mg total) by mouth 2 (two) times daily. May take with food to minimize GI side effects. Patient taking differently: Take 500 mg by mouth 2 (two) times daily.  05/01/17  Yes Dorena Dew, FNP   hydrOXYzine (ATARAX/VISTARIL) 10 MG tablet Take 1 tablet (10 mg total) by mouth 3 (three) times daily as needed for itching. 01/16/20  Yes Tresa Garter, MD  Nutritional Supplements (ENSURE ACTIVE HIGH PROTEIN) LIQD Take 1 each by mouth 3 (three) times daily between meals. #90 cans of Ensure Patient taking differently: Take 1 each by mouth See admin instructions. 5 times daily 11/13/18  Yes Dorena Dew, FNP  oxyCODONE-acetaminophen (PERCOCET) 10-325 MG tablet Take 1 tablet by mouth every 6 (six) hours as needed for up to 15 days for pain. 01/16/20 01/31/20 Yes Jegede, Olugbemiga E, MD  OXYCONTIN 20 MG 12 hr tablet Take 1 tablet (20 mg total) by mouth every 12 (twelve) hours. 12/23/19 01/22/20 Yes Vevelyn Francois, NP  rivaroxaban (XARELTO) 20 MG TABS tablet Take 1 tablet (20 mg total) by mouth daily with supper. 10/29/19 01/22/20 Yes Vevelyn Francois, NP     Allergies:   Allergies  Allergen Reactions  . Buprenorphine Hcl Hives  . Levaquin [Levofloxacin] Itching  . Meperidine Rash  . Morphine Hives  . Morphine And Related Hives  . Toradol [Ketorolac Tromethamine] Itching  . Tramadol Hives  . Vancomycin Itching  . Zosyn [Piperacillin Sod-Tazobactam So] Itching, Rash and Other (See Comments)    Has taken rocephin in the past     Physical Exam:   Vitals:   Vitals:   01/22/20 2137 01/22/20 2325  BP:    Pulse:    Resp: 18 18  Temp:    SpO2: 99% 99%    Physical Exam: Constitutional: Patient appears well-developed and well-nourished. Not in obvious distress. Severely underweight.  HENT: Normocephalic, atraumatic, External right and left ear normal. Oropharynx is clear and moist.  Eyes: Conjunctivae and EOM are normal. PERRLA, no scleral icterus. Neck: Normal ROM. Neck supple. No JVD. No tracheal deviation. No thyromegaly. CVS: RRR, S1/S2 +, no murmurs, no gallops, no carotid bruit.  Pulmonary: Effort and breath sounds normal, no stridor, rhonchi, wheezes, rales.  Abdominal:  Soft. BS +, no distension, tenderness, rebound or guarding.  Musculoskeletal: Normal range of motion. No edema and no tenderness.  Lymphadenopathy: No lymphadenopathy noted, cervical, inguinal or axillary Neuro: Alert. Normal reflexes, muscle tone coordination. No cranial nerve deficit. Skin: Skin is warm and dry. No rash noted. Not diaphoretic. No erythema. No pallor. Psychiatric: Normal mood and affect. Behavior, judgment, thought content normal.   Data Review:   CBC No results for input(s): WBC, HGB, HCT, PLT, MCV, MCH, MCHC, RDW, LYMPHSABS, MONOABS, EOSABS, BASOSABS, BANDABS in the last 168 hours.  Invalid input(s): NEUTRABS, BANDSABD ------------------------------------------------------------------------------------------------------------------  Chemistries  No results for input(s): NA, K, CL, CO2, GLUCOSE, BUN, CREATININE, CALCIUM, MG, AST, ALT, ALKPHOS, BILITOT in the last 168 hours.  Invalid input(s): GFRCGP ------------------------------------------------------------------------------------------------------------------ estimated creatinine clearance is 102.4 mL/min (A) (by C-G formula based on SCr of 0.53 mg/dL (L)). ------------------------------------------------------------------------------------------------------------------  No results for input(s): TSH, T4TOTAL, T3FREE, THYROIDAB in the last 72 hours.  Invalid input(s): FREET3  Coagulation profile No results for input(s): INR, PROTIME in the last 168 hours. ------------------------------------------------------------------------------------------------------------------- No results for input(s): DDIMER in the last 72 hours. -------------------------------------------------------------------------------------------------------------------  Cardiac Enzymes No results for input(s): CKMB, TROPONINI, MYOGLOBIN in the last 168 hours.  Invalid input(s):  CK ------------------------------------------------------------------------------------------------------------------ No results found for: BNP  ---------------------------------------------------------------------------------------------------------------  Urinalysis    Component Value Date/Time   COLORURINE YELLOW 11/04/2019 [place] 11/04/2019 1445   LABSPEC 1.011 11/04/2019 1445   PHURINE 7.0 11/04/2019 1445   GLUCOSEU NEGATIVE 11/04/2019 1445   HGBUR SMALL (A) 11/04/2019 1445   BILIRUBINUR small (A) 01/16/2020 0842   BILIRUBINUR neg 08/26/2019 0945   KETONESUR negative 01/16/2020 0842   KETONESUR NEGATIVE 11/04/2019 1445   PROTEINUR 30 (A) 11/04/2019 1445   UROBILINOGEN 2.0 (A) 01/16/2020 0842   UROBILINOGEN 2.0 (H) 05/01/2017 0955   NITRITE Negative 01/16/2020 0842   NITRITE NEGATIVE 11/04/2019 1445   LEUKOCYTESUR Negative 01/16/2020 0842   LEUKOCYTESUR NEGATIVE 11/04/2019 1445    ----------------------------------------------------------------------------------------------------------------   Imaging Results:    No results found.   Assessment & Plan:  Principal Problem:   Sickle cell pain crisis (HCC) Active Problems:   Major depressive disorder, recurrent, severe without psychotic features ([place])   Protein-calorie malnutrition, severe ([place])   Other chronic pain   History of DVT (deep vein thrombosis)  Sickle cell disease with pain crisis: Admit to MedSurg.  Continue IV Dilaudid PCA with settings of 0.5 mg, 10-minute lockout, and 3 mg/h. Continue IV fluids, 0.45% saline at 75 mL/h Toradol 15 mg IV every 6 hours for total of 5 days Monitor vital signs closely, reevaluate pain scale regularly, and supplemental oxygen as needed Patient will be reevaluated for pain in the context of function and relationship to baseline as care progresses  Chronic pain syndrome: Continue home medications  Sickle cell anemia: CBC with differential pending.   Baseline hemoglobin 9-10 g/dL.  Patient is typically not transfused unless hemoglobin is less than 7.0 g/dL.  Follow CBC.  History of major depression: Patient has a long history of depression.  He currently denies any symptoms.  No suicidal or homicidal ideations.  He is currently not on medication and has been lost to follow-up as an outpatient with psychiatry.  Continue to monitor closely.  Protein calorie malnutrition: Patient's BMI is 15.3.  Continue Ensure between meals.  Dietary consult warranted  DVT Prophylaxis: Continue Xarelto and SCDs  AM Labs Ordered, also please review Full Orders  Family Communication: Admission, patient's condition and plan of care including tests being ordered have been discussed with the patient who indicate understanding and agree with the plan and Code Status.  Code Status: Full Code  Consults called: None    Admission status: Inpatient    Time spent in minutes : 35 minutes  [place], MSN, FNP-C [facility] [address] [place], [place] [address] [phone]  01/22/2020 at 11:29 PM

## 2020-01-22 NOTE — Progress Notes (Addendum)
Pt admitted to the day hospital from the ED for treatment of sickle cell pain crisis. Pt reported pain to bilateral legs, back and chest  rated a 9/10. Pt given PO Benadryl,zofran and placed on Dilaudid PCA, and hydrated with IV fluids. Pt received toradol in the ER.  At discharge patient reported pain a 7/10. Pt stable, alert and oriented at transfer to hospital. Report given to Baldwin [place] RN. Pt transported by wheelchair by staff to [facility].

## 2020-01-22 NOTE — ED Provider Notes (Signed)
[facility] DEPT Provider Note   CSN: [id number] Arrival date & time: 01/22/20  9528     History Chief Complaint  Patient presents with  . Sickle Cell Pain Crisis    Timothy Marks is a 29 y.o. male.  Patient complains of upper back pain and leg pain.  This is typical for sickle cell pain.  The history is provided by the patient and medical records. No language interpreter was used.  Sickle Cell Pain Crisis Location:  Back Severity:  Moderate Onset quality:  Sudden Timing:  Constant Progression:  Partially resolved Chronicity:  Recurrent Sickle cell genotype:  SS Associated symptoms: no chest pain, no congestion, no cough, no fatigue and no headaches        Past Medical History:  Diagnosis Date  . Depression   . Generalized weakness 03/31/2019  . 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 Sickle cell anemia (HCC)   . Vitamin D deficiency y-6    Patient Active Problem List   Diagnosis Date Noted  . Medication management 01/16/2020  . Underweight 06/14/2019  . Generalized weakness 03/31/2019  . Sickle-cell crisis ([place]) 03/24/2019  . SIRS (systemic inflammatory response syndrome) ([place]) 02/10/2019  . Abnormal liver function 11/04/2018  . Sickle cell anemia with crisis ([place]) 05/22/2018  . Narcotic dependence ([place]) 02/10/2018  . Acute bilateral low back pain without sciatica   . Other chronic pain   . Anemia   . Socially inappropriate behavior 12/02/2017  . Thrombocythemia   . Physical deconditioning   . Agitation   . Chronic pain syndrome   . Recurrent cold sores   . Fever   . Drug-seeking behavior 08/30/2017  . Pain   . Sickle cell crisis ([place]) 07/16/2017  . Adjustment disorder with mixed disturbance of emotions and conduct   . Bilateral pulmonary infiltrates on chest x-ray 11/21/2016  . History of DVT (deep vein thrombosis) 11/17/2016  . Suicidal ideations 10/11/2016   . Reticulocytosis 10/09/2016  . DVT (deep venous thrombosis) ([place]) 08/02/2016  . Has difficulty accessing primary care provider for most visits 07/11/2015  . 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 Hordeolum externum of left upper eyelid 05/31/2015  . Major depressive disorder with current active episode 11/09/2014  . Hb-SS disease with acute chest syndrome ([place]) 11/08/2014  . Pneumonia of right upper lobe due to infectious organism 11/07/2014  . Constipation 08/03/2014  . Leukocytosis 06/30/2014  . 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 Protein-calorie malnutrition, severe ([place]) 04/09/2014  . Major depressive disorder, recurrent, severe without psychotic features ([place])   . 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 Vitamin D deficiency   . Sickle cell anemia ([place]) 03/30/2014  . Hyperbilirubinemia 03/30/2014  . Hypokalemia 02/07/2014  . Mandible fracture ([place]) 05/03/2013  . Aggressive behavior 01/25/2013  . 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 Sickle cell anemia with pain ([place]) 01/24/2013  . Sickle cell pain crisis ([place]) 01/24/2013  . 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 Other hemoglobinopathies ([place]) 04/22/2011  . 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 CHOLECYSTECTOMY         Family History  Problem Relation Age of Onset  . Sickle cell trait Mother   . Sickle cell trait Father     Social History   Tobacco Use  . Smoking status: Never Smoker  .  Smokeless tobacco: Never Used  Vaping Use  . Vaping Use: Never used  Substance Use Topics  . Alcohol use: No  . Drug use: Not Currently    Types: Marijuana    Home Medications Prior to Admission medications   Medication Sig Start Date End Date Taking? Authorizing Provider  cholecalciferol (VITAMIN D) 1000 units tablet Take 2 tablets (2,000 Units total) by mouth daily. 12/13/17    Azzie Glatter, FNP  folic acid (FOLVITE) 1 MG tablet Take 1 tablet (1 mg total) by mouth daily. 05/01/17   Dorena Dew, FNP  gabapentin (NEURONTIN) 300 MG capsule Take 1 capsule (300 mg total) by mouth 3 (three) times daily. 03/12/19   Dorena Dew, FNP  hydroxyurea (HYDREA) 500 MG capsule Take 1 capsule (500 mg total) by mouth 2 (two) times daily. May take with food to minimize GI side effects. Patient taking differently: Take 500 mg by mouth 2 (two) times daily.  05/01/17   Dorena Dew, FNP  hydrOXYzine (ATARAX/VISTARIL) 10 MG tablet Take 1 tablet (10 mg total) by mouth 3 (three) times daily as needed for itching. 01/16/20   Tresa Garter, MD  Nutritional Supplements (ENSURE ACTIVE HIGH PROTEIN) LIQD Take 1 each by mouth 3 (three) times daily between meals. #90 cans of Ensure Patient taking differently: Take 1 each by mouth See admin instructions. 5 times daily 11/13/18   Dorena Dew, FNP  oxyCODONE-acetaminophen (PERCOCET) 10-325 MG tablet Take 1 tablet by mouth every 6 (six) hours as needed for up to 15 days for pain. 01/16/20 01/31/20  Tresa Garter, MD  OXYCONTIN 20 MG 12 hr tablet Take 1 tablet (20 mg total) by mouth every 12 (twelve) hours. 12/23/19 01/22/20  Vevelyn Francois, NP  rivaroxaban (XARELTO) 20 MG TABS tablet Take 1 tablet (20 mg total) by mouth daily with supper. 10/29/19 01/16/20  Vevelyn Francois, NP    Allergies    Buprenorphine hcl, Levaquin [levofloxacin], Meperidine, Morphine, Morphine and related, Toradol [ketorolac tromethamine], Tramadol, Vancomycin, and Zosyn [piperacillin sod-tazobactam so]  Review of Systems   Review of Systems  Constitutional: Negative for appetite change and fatigue.  HENT: Negative for congestion, ear discharge and sinus pressure.   Eyes: Negative for discharge.  Respiratory: Negative for cough.   Cardiovascular: Negative for chest pain.  Gastrointestinal: Negative for abdominal pain and diarrhea.   Genitourinary: Negative for frequency and hematuria.  Musculoskeletal: Positive for back pain.  Skin: Negative for rash.  Neurological: Negative for seizures and headaches.  Psychiatric/Behavioral: Negative for hallucinations.    Physical Exam Updated Vital Signs BP 124/74   Pulse 81   Temp 99.2 F (37.3 C) (Oral)   Resp 15   SpO2 100%   Physical Exam Vitals and nursing note reviewed.  Constitutional:      General: He is in acute distress.     Appearance: He is well-developed. He is not toxic-appearing.  HENT:     Head: Normocephalic.     Nose: Nose normal.  Eyes:     General: No scleral icterus.    Conjunctiva/sclera: Conjunctivae normal.  Neck:     Thyroid: No thyromegaly.  Cardiovascular:     Rate and Rhythm: Normal rate and regular rhythm.     Heart sounds: No murmur heard.  No friction rub. No gallop.   Pulmonary:     Breath sounds: No stridor. No wheezing or rales.  Chest:     Chest wall: No tenderness.  Abdominal:  General: There is no distension.     Tenderness: There is no abdominal tenderness. There is no rebound.  Musculoskeletal:        General: Normal range of motion.     Cervical back: Neck supple.  Lymphadenopathy:     Cervical: No cervical adenopathy.  Skin:    Findings: No erythema or rash.  Neurological:     Mental Status: He is alert and oriented to person, place, and time.     Motor: No abnormal muscle tone.     Coordination: Coordination normal.  Psychiatric:        Behavior: Behavior normal.     ED Results / Procedures / Treatments   Labs (all labs ordered are listed, but only abnormal results are displayed) Labs Reviewed - No data to display  EKG EKG Interpretation  Date/Time:  Wednesday January 22 2020 07:39:01 EST Ventricular Rate:  92 PR Interval:    QRS Duration: 100 QT Interval:  345 QTC Calculation: 427 R Axis:   84 Text Interpretation: Sinus rhythm Probable left ventricular hypertrophy Anterior Q waves,  possibly due to LVH Confirmed by Milton Ferguson [phone]) on 01/22/2020 8:47:06 AM   Radiology No results found.  Procedures Procedures (including critical care time)  Medications Ordered in ED Medications  HYDROmorphone (DILAUDID) injection 1 mg (1 mg Intravenous Given 01/22/20 0814)  ketorolac (TORADOL) 30 MG/ML injection 15 mg (15 mg Intravenous Given 01/22/20 0814)  sodium chloride 0.9 % bolus 1,000 mL (1,000 mLs Intravenous New Bag/Given 01/22/20 0813)    ED Course  I have reviewed the triage vital signs and the nursing notes.  Pertinent labs & imaging results that were available during my care of the patient were reviewed by me and considered in my medical decision making (see chart for details).    MDM Rules/Calculators/A&P                          Patient with sickle cell crisis.  Patient was given some Dilaudid that did not help along with Toradol.  He will be sent over to the sickle clinic Final Clinical Impression(s) / ED Diagnoses Final diagnoses:  Sickle cell pain crisis [facility])    Rx / DC Orders ED Discharge Orders    None       Milton Ferguson, MD 01/22/20 [phone]

## 2020-01-22 NOTE — ED Triage Notes (Signed)
Patient arrived with complaint of chest, back, and leg pain related to [facility] over the last few days. No complaints of shortness of breath. Reports last pain pill at 3pm.

## 2020-01-23 ENCOUNTER — Other Ambulatory Visit: Payer: Self-pay | Admitting: Internal Medicine

## 2020-01-23 ENCOUNTER — Other Ambulatory Visit: Payer: Self-pay | Admitting: Family Medicine

## 2020-01-23 DIAGNOSIS — M545 Low back pain, unspecified: Secondary | ICD-10-CM | POA: Diagnosis present

## 2020-01-23 DIAGNOSIS — U071 COVID-19: Secondary | ICD-10-CM | POA: Diagnosis not present

## 2020-01-23 DIAGNOSIS — G894 Chronic pain syndrome: Secondary | ICD-10-CM

## 2020-01-23 DIAGNOSIS — D5700 Hb-SS disease with crisis, unspecified: Secondary | ICD-10-CM | POA: Diagnosis not present

## 2020-01-23 DIAGNOSIS — Z23 Encounter for immunization: Secondary | ICD-10-CM | POA: Diagnosis not present

## 2020-01-23 DIAGNOSIS — Z79891 Long term (current) use of opiate analgesic: Secondary | ICD-10-CM

## 2020-01-23 LAB — COMPREHENSIVE METABOLIC PANEL
ALT: 16 U/L (ref 0–44)
AST: 46 U/L — ABNORMAL HIGH (ref 15–41)
Albumin: 4.1 g/dL (ref 3.5–5.0)
Alkaline Phosphatase: 64 U/L (ref 38–126)
Anion gap: 10 (ref 5–15)
BUN: 11 mg/dL (ref 6–20)
CO2: 24 mmol/L (ref 22–32)
Calcium: 8.3 mg/dL — ABNORMAL LOW (ref 8.9–10.3)
Chloride: 103 mmol/L (ref 98–111)
Creatinine, Ser: 0.49 mg/dL — ABNORMAL LOW (ref 0.61–1.24)
GFR, Estimated: >60 mL/min (ref >60)
Glucose, Bld: 102 mg/dL — ABNORMAL HIGH (ref 70–99)
Potassium: 3.7 mmol/L (ref 3.5–5.1)
Sodium: 137 mmol/L (ref 135–145)
Total Bilirubin: 5.1 mg/dL — ABNORMAL HIGH (ref 0.3–1.2)
Total Protein: 7.3 g/dL (ref 6.5–8.1)

## 2020-01-23 LAB — URINALYSIS, ROUTINE W REFLEX MICROSCOPIC
Bacteria, UA: NONE SEEN
Bilirubin Urine: NEGATIVE
Glucose, UA: NEGATIVE mg/dL
Ketones, ur: NEGATIVE mg/dL
Leukocytes,Ua: NEGATIVE
Nitrite: NEGATIVE
Protein, ur: 30 mg/dL — AB
Specific Gravity, Urine: 1.011 (ref 1.005–1.030)
pH: 6 (ref 5.0–8.0)

## 2020-01-23 LAB — MAGNESIUM: Magnesium: 2 mg/dL (ref 1.7–2.4)

## 2020-01-23 LAB — CBC WITH DIFFERENTIAL/PLATELET
Abs Immature Granulocytes: 0.25 10*3/uL — ABNORMAL HIGH (ref 0.00–0.07)
Basophils Absolute: 0 10*3/uL (ref 0.0–0.1)
Basophils Relative: 0 %
Eosinophils Absolute: 0.3 10*3/uL (ref 0.0–0.5)
Eosinophils Relative: 1 %
HCT: 23.4 % — ABNORMAL LOW (ref 39.0–52.0)
Hemoglobin: 8.1 g/dL — ABNORMAL LOW (ref 13.0–17.0)
Immature Granulocytes: 1 %
Lymphocytes Relative: 10 %
Lymphs Abs: 2.2 10*3/uL (ref 0.7–4.0)
MCH: 30.7 pg (ref 26.0–34.0)
MCHC: 34.6 g/dL (ref 30.0–36.0)
MCV: 88.6 fL (ref 80.0–100.0)
Monocytes Absolute: 1.1 10*3/uL — ABNORMAL HIGH (ref 0.1–1.0)
Monocytes Relative: 5 %
Neutro Abs: 17.1 10*3/uL — ABNORMAL HIGH (ref 1.7–7.7)
Neutrophils Relative %: 83 %
Platelets: 375 10*3/uL (ref 150–400)
RBC: 2.64 MIL/uL — ABNORMAL LOW (ref 4.22–5.81)
RDW: 19.4 % — ABNORMAL HIGH (ref 11.5–15.5)
WBC: 20.9 10*3/uL — ABNORMAL HIGH (ref 4.0–10.5)
nRBC: 1.8 % — ABNORMAL HIGH (ref 0.0–0.2)

## 2020-01-23 LAB — PHOSPHORUS: Phosphorus: 3.5 mg/dL (ref 2.5–4.6)

## 2020-01-23 LAB — HIV ANTIBODY (ROUTINE TESTING W REFLEX): HIV Screen 4th Generation wRfx: NONREACTIVE

## 2020-01-23 LAB — RETICULOCYTES
Immature Retic Fract: 34.3 % — ABNORMAL HIGH (ref 2.3–15.9)
RBC.: 2.61 MIL/uL — ABNORMAL LOW (ref 4.22–5.81)
Retic Count, Absolute: 275.6 10*3/uL — ABNORMAL HIGH (ref 19.0–186.0)
Retic Ct Pct: 10.6 % — ABNORMAL HIGH (ref 0.4–3.1)

## 2020-01-23 LAB — RAPID URINE DRUG SCREEN, HOSP PERFORMED
Amphetamines: NOT DETECTED
Barbiturates: NOT DETECTED
Benzodiazepines: NOT DETECTED
Cocaine: NOT DETECTED
Opiates: POSITIVE — AB
Tetrahydrocannabinol: POSITIVE — AB

## 2020-01-23 LAB — C-REACTIVE PROTEIN: CRP: 6.6 mg/dL — ABNORMAL HIGH (ref <1.0)

## 2020-01-23 LAB — FERRITIN: Ferritin: 1739 ng/mL — ABNORMAL HIGH (ref 24–336)

## 2020-01-23 MED ORDER — OXYCODONE HCL 5 MG PO TABS: 5.0000 mg | ORAL_TABLET | Freq: Four times a day (QID) | ORAL | Status: DC | PRN

## 2020-01-23 MED ORDER — OXYCODONE-ACETAMINOPHEN 5-325 MG PO TABS
1.0000 | ORAL_TABLET | Freq: Four times a day (QID) | ORAL | Status: DC | PRN
  Administered 2020-01-23: 1 via ORAL
  Filled 2020-01-23: qty 1

## 2020-01-23 MED ORDER — OXYCONTIN 20 MG PO T12A: 20.0000 mg | EXTENDED_RELEASE_TABLET | Freq: Two times a day (BID) | ORAL | 0 refills | Status: DC

## 2020-01-23 MED ORDER — ACETAMINOPHEN 650 MG RE SUPP: 650.0000 mg | Freq: Four times a day (QID) | RECTAL | Status: DC | PRN

## 2020-01-23 MED ORDER — ACETAMINOPHEN 325 MG PO TABS: 650.0000 mg | ORAL_TABLET | Freq: Four times a day (QID) | ORAL | Status: DC | PRN

## 2020-01-23 NOTE — Progress Notes (Signed)
Attempted to communicate with patient for over an hour that he was medically cleared for discharge & to please call a ride. Cookie the case manager also spoke with patient over the phone. Patients aggressive behavior worsened along with foul language towards staff. Security was called for safety. Patients IV's were removed, cellphone & tablet w/ pt along with duffle bag etc. Security walked with patient downstairs to be discharged, while he again started to try & record staff on the way out after he was previously asked politely not do so. AVS and code 44 paperwork provided & reviewed with patient.

## 2020-01-23 NOTE — TOC Progression Note (Signed)
Transition of Care [facility]) - Progression Note    Patient Details  Name: [name] MRN: [id number] Date of Birth: [birth year]  Transition of [facility]) CM/SW Contact  Purcell Mouton, RN Phone Number: 01/23/2020, 12:42 PM  Clinical Narrative:     Pt will discharge home with no needs.   Expected Discharge Plan: Home/Self Care Barriers to Discharge: No Barriers Identified  Expected Discharge Plan and Services Expected Discharge Plan: Home/Self Care       Living arrangements for the past 2 months: Single Family Home                                       Social Determinants of Health (SDOH) Interventions    Readmission Risk Interventions No flowsheet data found.

## 2020-01-23 NOTE — Care Management Obs Status (Signed)
[facility] NOTIFICATION   Patient Details  Name: Timothy Marks MRN: [id number] Date of Birth: 1990/04/19   Medicare Observation Status Notification Given:  Yes    Purcell Mouton, RN 01/23/2020, 3:08 PM

## 2020-01-23 NOTE — Care Management CC44 (Signed)
Condition Code 44 Documentation Completed  Patient Details  Name: Timothy Marks MRN: [id number] Date of Birth: 03-27-1990   Condition Code 44 given:  Yes Patient signature on Condition Code 44 notice:  Yes Documentation of 2 MD's agreement:  Yes Code 44 added to claim:  Yes    Purcell Mouton, RN 01/23/2020, 3:08 PM

## 2020-01-23 NOTE — Progress Notes (Signed)
CN called to room to assist with patient being verbally aggressive and refusing to leave after MD discharged by MD. Security was also at bedside to support patient and staff safety. Patient verbally aggressive towards RN, CN and security. Security escorted patient to entrance.

## 2020-01-23 NOTE — Discharge Summary (Signed)
Physician Discharge Summary  Timothy Marks ACZ:[id number] DOB: 1990-11-21 DOA: 01/22/2020  PCP: Pcp, No  Admit date: 01/22/2020  Discharge date: 01/23/2020  Discharge Diagnoses:  Principal Problem:   Sickle cell pain crisis ([place]) Active Problems:   Major depressive disorder, recurrent, severe without psychotic features ([place])   Protein-calorie malnutrition, severe ([place])   Other chronic pain   History of DVT (deep vein thrombosis)   Discharge Condition: Stable  Disposition:  Pt is discharged home in good condition and is to follow up with Dr. Doreene Burke this week to have labs evaluated. Timothy Marks is instructed to increase activity slowly and balance with rest for the next few days, and use prescribed medication to complete treatment of pain  Diet: Regular Wt Readings from Last 3 Encounters:  01/22/20 54.1 kg  01/16/20 54.1 kg  12/19/19 65 kg    History of present illness:  Timothy Marks is a 29 year old male with a medical history significant for sickle cell disease, chronic pain syndrome, opiate dependence and tolerance, oppositional defiance, and history of anemia of chronic disease presents complaining of low back pain and central chest pain that is consistent with his typical crisis.  Patient was treated and evaluated in the emergency department this a.m., agree with ER provider that patient was appropriate to transition to sickle cell day clinic for further pain management and extended observation.  Patient states that her pain intensity has been increased since last week and primarily to lower back.  He says that he awakened suddenly this a.m. with pain to central chest and low back characterized as sharp and constant.  He last had OxyContin on last night without sustained relief.  Patient was also treated in the sickle cell day clinic on 01/20/2020 for this problem.  He currently denies any subjective fevers, chills, headache, shortness of breath, persistent cough.  No urinary  symptoms, nausea, vomiting, or diarrhea.  No sick contacts, recent travel, or exposure to COVID-19.  Sickle cell day hospital course:  Patient transferred to sickle cell day clinic from emergency department this a.m.  Received Dilaudid 1 mg IV, Toradol, and IV fluids prior to transfer.  Pain persisted despite medications. Vital signs show: BP 127/66 (BP Location: Left Arm)   Pulse 76   Temp 98.9 F (37.2 C) (Oral)   Resp 18   Ht 6\' 2"  (1.88 m)   Wt 54.1 kg   SpO2 99%   BMI 15.31 kg/m  CBC, CMP, and reticulocytes pending. COVID 19 pending. Patient admitted in observation for further management of sickle cell pain crisis.   Hospital Course:  Sickle cell disease with pain crisis: Patient was admitted for sickle cell pain crisis and managed appropriately with IVF, IV Dilaudid via PCA and IV Toradol, as well as other adjunct therapies per sickle cell pain management protocols. Pain intensity has improved some.  He rates pain as 5-6/10.  Patient will resume home medication regimen.  Reviewed PDMP, pain medications will continue to be prescribed by PCP. Reviewed all laboratory values.  Hemoglobin 8.1, consistent with patient's baseline.  WBCs elevated at 20.9.  Patient afebrile.  Oxygen saturation 100% on RA. Positive COVID-19 test: Patient tested positive for COVID-19, which is an incidental finding.  On yesterday, patient endorsed headache.  He does not have any persistent cough, shortness of breath, or chest pain.  Patient advised to continue to follow CDC guidelines on isolation at discharge.  Patient received 1 dose of monoclonal antibody without complication.  Tolerated well. Was exhibiting socially inappropriate behavior  throughout admission including cursing and swatting at staff.  Patient is not currently followed by psychiatry for major depressive disorder.He currently denies any suicidal or homicidal intent. No visual or auditory hallucinations.  Patient warrants a referral to psychiatry.   Timothy Marks is advised to follow up with PCP.  Patient is alert, oriented, and ambulating without assistance.  Patient was therefore discharged home today in a hemodynamically stable condition.   Timothy Marks will follow-up with PCP within 1 week of this discharge. Timothy Marks was counseled extensively about nonpharmacologic means of pain management, patient verbalized understanding and was appreciative of  the care received during this admission.   We discussed the need for good hydration, monitoring of hydration status, avoidance of heat, cold, stress, and infection triggers. We discussed the need to be adherent with taking Hydrea and other home medications. Patient was reminded of the need to seek medical attention immediately if any symptom of bleeding, anemia, or infection occurs.  Discharge Exam: Vitals:   01/23/20 0754 01/23/20 1303  BP:    Pulse:    Resp: 12 11  Temp:    SpO2:  98%   Vitals:   01/23/20 0339 01/23/20 0418 01/23/20 0754 01/23/20 1303  BP: 110/67 (!) 106/56    Pulse: 98 74    Resp: 12 12 12 11   Temp: 99.5 F (37.5 C) 98.6 F (37 C)    TempSrc: Oral Oral    SpO2: 97% 100%  98%  Weight:      Height:        General appearance : Awake, alert, not in any distress. Speech Clear. Not toxic looking HEENT: Atraumatic and Normocephalic, pupils equally reactive to light and accomodation Neck: Supple, no JVD. No cervical lymphadenopathy.  Chest: Good air entry bilaterally, no added sounds  CVS: S1 S2 regular, no murmurs.  Abdomen: Bowel sounds present, Non tender and not distended with no gaurding, rigidity or rebound. Extremities: B/L Lower Ext shows no edema, both legs are warm to touch Neurology: Awake alert, and oriented X 3, CN II-XII intact, Non focal Skin: No Rash  Discharge Instructions  Discharge Instructions    Discharge patient   Complete by: As directed    Discharge disposition: 01-Home or Self Care   Discharge patient date: 01/23/2020     Allergies as  of 01/23/2020      Reactions   Buprenorphine Hcl Hives   Levaquin [levofloxacin] Itching   Meperidine Rash   Morphine Hives   Morphine And Related Hives   Toradol [ketorolac Tromethamine] Itching   Tramadol Hives   Vancomycin Itching   Zosyn [piperacillin Sod-tazobactam So] Itching, Rash, Other (See Comments)   Has taken rocephin in the past      Medication List    TAKE these medications   cholecalciferol 25 MCG (1000 UNIT) tablet Commonly known as: VITAMIN D Take 2 tablets (2,000 Units total) by mouth daily.   Ensure Active High Protein Liqd Take 1 each by mouth 3 (three) times daily between meals. #90 cans of Ensure What changed:   when to take this  additional instructions   folic acid 1 MG tablet Commonly known as: FOLVITE Take 1 tablet (1 mg total) by mouth daily.   gabapentin 300 MG capsule Commonly known as: NEURONTIN Take 1 capsule (300 mg total) by mouth 3 (three) times daily.   hydroxyurea 500 MG capsule Commonly known as: HYDREA Take 1 capsule (500 mg total) by mouth 2 (two) times daily. May take with food to minimize GI side  effects. What changed: additional instructions   hydrOXYzine 10 MG tablet Commonly known as: ATARAX/VISTARIL Take 1 tablet (10 mg total) by mouth 3 (three) times daily as needed for itching.   oxyCODONE-acetaminophen 10-325 MG tablet Commonly known as: Percocet Take 1 tablet by mouth every 6 (six) hours as needed for up to 15 days for pain.   OxyCONTIN 20 mg 12 hr tablet Generic drug: oxyCODONE Take 1 tablet (20 mg total) by mouth every 12 (twelve) hours.   rivaroxaban 20 MG Tabs tablet Commonly known as: XARELTO Take 1 tablet (20 mg total) by mouth daily with supper.       The results of significant diagnostics from this hospitalization (including imaging, microbiology, ancillary and laboratory) are listed below for reference.    Significant Diagnostic Studies: No results found.  Microbiology: Recent Results (from the  past 240 hour(s))  Resp Panel by RT-PCR (Flu A&B, Covid) Nasopharyngeal Swab     Status: Abnormal   Collection Time: 01/22/20  5:47 PM   Specimen: Nasopharyngeal Swab; Nasopharyngeal(NP) swabs in vial transport medium  Result Value Ref Range Status   SARS Coronavirus 2 by RT PCR POSITIVE (A) NEGATIVE Final    Comment: RESULT CALLED TO, READ BACK BY AND VERIFIED WITH: BRADY,H. RN @1909  ON 12.08.2021 BY COHEN,K (NOTE) SARS-CoV-2 target nucleic acids are DETECTED.  The SARS-CoV-2 RNA is generally detectable in upper respiratory specimens during the acute phase of infection. Positive results are indicative of the presence of the identified virus, but do not rule out bacterial infection or co-infection with other pathogens not detected by the test. Clinical correlation with patient history and other diagnostic information is necessary to determine patient infection status. The expected result is Negative.  Fact Sheet for Patients: EntrepreneurPulse.com.au  Fact Sheet for Healthcare Providers: IncredibleEmployment.be  This test is not yet approved or cleared by the [place] FDA and  has been authorized for detection and/or diagnosis of SARS-CoV-2 by FDA under an Emergency Use Authorization (EUA).  This EUA will remain in effect (meaning this te st can be used) for the duration of  the COVID-19 declaration under Section 564(b)(1) of the Act, 21 U.S.C. section 360bbb-3(b)(1), unless the authorization is terminated or revoked sooner.     Influenza A by PCR NEGATIVE NEGATIVE Final   Influenza B by PCR NEGATIVE NEGATIVE Final    Comment: (NOTE) The Xpert Xpress SARS-CoV-2/FLU/RSV plus assay is intended as an aid in the diagnosis of influenza from Nasopharyngeal swab specimens and should not be used as a sole basis for treatment. Nasal washings and aspirates are unacceptable for Xpert Xpress SARS-CoV-2/FLU/RSV testing.  Fact Sheet for  Patients: EntrepreneurPulse.com.au  Fact Sheet for Healthcare Providers: IncredibleEmployment.be  This test is not yet approved or cleared by the [place] FDA and has been authorized for detection and/or diagnosis of SARS-CoV-2 by FDA under an Emergency Use Authorization (EUA). This EUA will remain in effect (meaning this test can be used) for the duration of the COVID-19 declaration under Section 564(b)(1) of the Act, 21 U.S.C. section 360bbb-3(b)(1), unless the authorization is terminated or revoked.  Performed at [facility], [place] [address], [place], [place] [address]      Labs: Basic Metabolic Panel: Recent Labs  Lab 01/23/20 0149  NA 137  K 3.7  CL 103  CO2 24  GLUCOSE 102*  BUN 11  CREATININE 0.49*  CALCIUM 8.3*  MG 2.0  PHOS 3.5   Liver Function Tests: Recent Labs  Lab 01/23/20 0149  AST 46*  ALT 16  ALKPHOS 64  BILITOT 5.1*  PROT 7.3  ALBUMIN 4.1   No results for input(s): LIPASE, AMYLASE in the last 168 hours. No results for input(s): AMMONIA in the last 168 hours. CBC: Recent Labs  Lab 01/23/20 0149  WBC 20.9*  NEUTROABS 17.1*  HGB 8.1*  HCT 23.4*  MCV 88.6  PLT 375   Cardiac Enzymes: No results for input(s): CKTOTAL, CKMB, CKMBINDEX, TROPONINI in the last 168 hours. BNP: Invalid input(s): POCBNP CBG: No results for input(s): GLUCAP in the last 168 hours.  Time coordinating discharge: 50 minutes  Signed:  [name]  APRN, MSN, FNP-C Patient [place] [address] [place], [place] [id number] [phone]   [facility] 01/23/2020, 2:22 PM

## 2020-01-23 NOTE — Telephone Encounter (Signed)
OxyContin Refilled. Thanks

## 2020-01-24 ENCOUNTER — Encounter: Payer: Self-pay | Admitting: Family Medicine

## 2020-01-24 ENCOUNTER — Ambulatory Visit: Payer: Self-pay

## 2020-01-24 NOTE — Telephone Encounter (Signed)
Pati10ent called and says he went in the hospital on 01/22/20-01/23/20 for sickle cell crisis. He says they COVID tested and it came back positive, he just found out. He asks is that a true test or a false/positive. I advised that it was an accurate test. I asked about symptoms, he denies any symptoms. He says his girlfriend tested negative. I advised to quarantine 10 days away from his girlfriend, monitor temp and for symptoms. Advised for girlfriend to get retested in 5 days. Advised social distancing and CDC guidelines.Advised to call PCP to let them know of testing results and any recommendations. He asked about retesting, I advised no retesting is recommended by CDC because he could test positive for 90 days. But if he needs a negative test for work, test after quarantine. Patient verbalized understanding of all advice.  Reason for Disposition . [1] MVEHM-09 diagnosed by positive lab test AND [2] NO symptoms (e.g., cough, fever, others)  Answer Assessment - Initial Assessment Questions 1. COVID-19 DIAGNOSIS: "Who made your Coronavirus (COVID-19) diagnosis?" "Was it confirmed by a positive lab test?" If not diagnosed by a HCP, ask "Are there lots of cases (community spread) where you live?" (See public health department website, if unsure)     Lab test at the hospital 2. COVID-19 EXPOSURE: "Was there any known exposure to COVID before the symptoms began?" CDC Definition of close contact: within 6 feet (2 meters) for a total of 15 minutes or more over a 24-hour period.      No symptoms 3. ONSET: "When did the COVID-19 symptoms start?"      No symptoms 4. WORST SYMPTOM: "What is your worst symptom?" (e.g., cough, fever, shortness of breath, muscle aches)     No symptoms 5. COUGH: "Do you have a cough?" If Yes, ask: "How bad is the cough?"       No 6. FEVER: "Do you have a fever?" If Yes, ask: "What is your temperature, how was it measured, and when did it start?"     No 7. RESPIRATORY STATUS:  "Describe your breathing?" (e.g., shortness of breath, wheezing, unable to speak)      No 8. BETTER-SAME-WORSE: "Are you getting better, staying the same or getting worse compared to yesterday?"  If getting worse, ask, "In what way?"     Better 9. HIGH RISK DISEASE: "Do you have any chronic medical problems?" (e.g., asthma, heart or lung disease, weak immune system, obesity, etc.)     Sickle Cell 10. PREGNANCY: "Is there any chance you are pregnant?" "When was your last menstrual period?"       N/A 11. OTHER SYMPTOMS: "Do you have any other symptoms?"  (e.g., chills, fatigue, headache, loss of smell or taste, muscle pain, sore throat; new loss of smell or taste especially support the diagnosis of COVID-19)       No  Protocols used: CORONAVIRUS (COVID-19) DIAGNOSED OR SUSPECTED-A-AH

## 2020-02-03 ENCOUNTER — Telehealth: Payer: Self-pay | Admitting: Internal Medicine

## 2020-02-03 ENCOUNTER — Other Ambulatory Visit: Payer: Self-pay | Admitting: Internal Medicine

## 2020-02-03 DIAGNOSIS — G894 Chronic pain syndrome: Secondary | ICD-10-CM

## 2020-02-03 DIAGNOSIS — Z79891 Long term (current) use of opiate analgesic: Secondary | ICD-10-CM

## 2020-02-03 MED ORDER — OXYCONTIN 20 MG PO T12A: 20.0000 mg | EXTENDED_RELEASE_TABLET | Freq: Two times a day (BID) | ORAL | 0 refills | Status: DC

## 2020-02-03 NOTE — Telephone Encounter (Signed)
Prescription sent

## 2020-02-06 ENCOUNTER — Telehealth: Payer: Self-pay | Admitting: Internal Medicine

## 2020-02-06 ENCOUNTER — Other Ambulatory Visit: Payer: Self-pay | Admitting: Internal Medicine

## 2020-02-06 DIAGNOSIS — G894 Chronic pain syndrome: Secondary | ICD-10-CM

## 2020-02-06 DIAGNOSIS — D571 Sickle-cell disease without crisis: Secondary | ICD-10-CM

## 2020-02-06 MED ORDER — OXYCODONE-ACETAMINOPHEN 10-325 MG PO TABS: 1.0000 | ORAL_TABLET | Freq: Four times a day (QID) | ORAL | 0 refills | Status: DC | PRN

## 2020-02-06 NOTE — Telephone Encounter (Signed)
Refilled

## 2020-02-11 ENCOUNTER — Non-Acute Institutional Stay (HOSPITAL_COMMUNITY)
Admission: AD | Admit: 2020-02-11 | Discharge: 2020-02-11 | Disposition: A | Discharge status: Home / Self Care | Payer: Medicare Other | Source: Ambulatory Visit | Attending: Internal Medicine | Admitting: Internal Medicine

## 2020-02-11 ENCOUNTER — Telehealth (HOSPITAL_COMMUNITY): Payer: Self-pay | Admitting: General Practice

## 2020-02-11 ENCOUNTER — Encounter (HOSPITAL_COMMUNITY): Payer: Self-pay | Admitting: Family Medicine

## 2020-02-11 DIAGNOSIS — D638 Anemia in other chronic diseases classified elsewhere: Secondary | ICD-10-CM | POA: Diagnosis not present

## 2020-02-11 DIAGNOSIS — Z79899 Other long term (current) drug therapy: Secondary | ICD-10-CM | POA: Insufficient documentation

## 2020-02-11 DIAGNOSIS — Z881 Allergy status to other antibiotic agents status: Secondary | ICD-10-CM | POA: Diagnosis not present

## 2020-02-11 DIAGNOSIS — Z880 Allergy status to penicillin: Secondary | ICD-10-CM | POA: Insufficient documentation

## 2020-02-11 DIAGNOSIS — F1120 Opioid dependence, uncomplicated: Secondary | ICD-10-CM | POA: Diagnosis not present

## 2020-02-11 DIAGNOSIS — Z885 Allergy status to narcotic agent status: Secondary | ICD-10-CM | POA: Diagnosis not present

## 2020-02-11 DIAGNOSIS — Z888 Allergy status to other drugs, medicaments and biological substances status: Secondary | ICD-10-CM | POA: Diagnosis not present

## 2020-02-11 DIAGNOSIS — Z7901 Long term (current) use of anticoagulants: Secondary | ICD-10-CM | POA: Diagnosis not present

## 2020-02-11 DIAGNOSIS — Z832 Family history of diseases of the blood and blood-forming organs and certain disorders involving the immune mechanism: Secondary | ICD-10-CM | POA: Insufficient documentation

## 2020-02-11 DIAGNOSIS — M545 Low back pain, unspecified: Secondary | ICD-10-CM | POA: Diagnosis not present

## 2020-02-11 DIAGNOSIS — D5700 Hb-SS disease with crisis, unspecified: Secondary | ICD-10-CM | POA: Diagnosis present

## 2020-02-11 DIAGNOSIS — G894 Chronic pain syndrome: Secondary | ICD-10-CM | POA: Diagnosis not present

## 2020-02-11 DIAGNOSIS — Z9049 Acquired absence of other specified parts of digestive tract: Secondary | ICD-10-CM | POA: Insufficient documentation

## 2020-02-11 LAB — COMPREHENSIVE METABOLIC PANEL
ALT: 12 U/L (ref 0–44)
AST: 23 U/L (ref 15–41)
Albumin: 4.2 g/dL (ref 3.5–5.0)
Alkaline Phosphatase: 71 U/L (ref 38–126)
Anion gap: 9 (ref 5–15)
BUN: 7 mg/dL (ref 6–20)
CO2: 28 mmol/L (ref 22–32)
Calcium: 8.6 mg/dL — ABNORMAL LOW (ref 8.9–10.3)
Chloride: 98 mmol/L (ref 98–111)
Creatinine, Ser: 0.71 mg/dL (ref 0.61–1.24)
GFR, Estimated: >60 mL/min (ref >60)
Glucose, Bld: 86 mg/dL (ref 70–99)
Potassium: 3.4 mmol/L — ABNORMAL LOW (ref 3.5–5.1)
Sodium: 135 mmol/L (ref 135–145)
Total Bilirubin: 2.8 mg/dL — ABNORMAL HIGH (ref 0.3–1.2)
Total Protein: 7.3 g/dL (ref 6.5–8.1)

## 2020-02-11 LAB — CBC WITH DIFFERENTIAL/PLATELET
Abs Immature Granulocytes: 0.05 10*3/uL (ref 0.00–0.07)
Basophils Absolute: 0 10*3/uL (ref 0.0–0.1)
Basophils Relative: 0 %
Eosinophils Absolute: 0.1 10*3/uL (ref 0.0–0.5)
Eosinophils Relative: 1 %
HCT: 34.8 % — ABNORMAL LOW (ref 39.0–52.0)
Hemoglobin: 11.2 g/dL — ABNORMAL LOW (ref 13.0–17.0)
Immature Granulocytes: 1 %
Lymphocytes Relative: 20 %
Lymphs Abs: 1.5 10*3/uL (ref 0.7–4.0)
MCH: 28.9 pg (ref 26.0–34.0)
MCHC: 32.2 g/dL (ref 30.0–36.0)
MCV: 89.9 fL (ref 80.0–100.0)
Monocytes Absolute: 1.6 10*3/uL — ABNORMAL HIGH (ref 0.1–1.0)
Monocytes Relative: 22 %
Neutro Abs: 4.3 10*3/uL (ref 1.7–7.7)
Neutrophils Relative %: 56 %
Platelets: 317 10*3/uL (ref 150–400)
RBC: 3.87 MIL/uL — ABNORMAL LOW (ref 4.22–5.81)
RDW: 16.7 % — ABNORMAL HIGH (ref 11.5–15.5)
WBC: 7.5 10*3/uL (ref 4.0–10.5)
nRBC: 2.7 % — ABNORMAL HIGH (ref 0.0–0.2)

## 2020-02-11 LAB — RETICULOCYTES
Immature Retic Fract: 25.8 % — ABNORMAL HIGH (ref 2.3–15.9)
RBC.: 3.87 MIL/uL — ABNORMAL LOW (ref 4.22–5.81)
Retic Count, Absolute: 261.2 10*3/uL — ABNORMAL HIGH (ref 19.0–186.0)
Retic Ct Pct: 6.8 % — ABNORMAL HIGH (ref 0.4–3.1)

## 2020-02-11 MED ORDER — ONDANSETRON HCL 4 MG/2ML IJ SOLN: 4.0000 mg | Freq: Four times a day (QID) | INTRAMUSCULAR | Status: DC | PRN

## 2020-02-11 MED ORDER — SODIUM CHLORIDE 0.9% FLUSH: 9.0000 mL | INTRAVENOUS | Status: DC | PRN

## 2020-02-11 MED ORDER — NALOXONE HCL 0.4 MG/ML IJ SOLN: 0.4000 mg | INTRAMUSCULAR | Status: DC | PRN

## 2020-02-11 MED ORDER — DIPHENHYDRAMINE HCL 25 MG PO CAPS: 25.0000 mg | ORAL_CAPSULE | ORAL | Status: DC | PRN

## 2020-02-11 MED ORDER — SODIUM CHLORIDE 0.45 % IV SOLN: INTRAVENOUS | Status: DC

## 2020-02-11 MED ORDER — KETOROLAC TROMETHAMINE 30 MG/ML IJ SOLN
15.0000 mg | Freq: Once | INTRAMUSCULAR | Status: AC
  Administered 2020-02-11: 15 mg via INTRAVENOUS
  Filled 2020-02-11: qty 1

## 2020-02-11 MED ORDER — HYDROMORPHONE 1 MG/ML IV SOLN
INTRAVENOUS | Status: DC
  Administered 2020-02-11: 7.5 mg via INTRAVENOUS
  Administered 2020-02-11: 30 mg via INTRAVENOUS
  Filled 2020-02-11: qty 30

## 2020-02-11 MED ORDER — DIPHENHYDRAMINE HCL 25 MG PO CAPS
25.0000 mg | ORAL_CAPSULE | Freq: Once | ORAL | Status: AC
  Administered 2020-02-11: 25 mg via ORAL
  Filled 2020-02-11: qty 1

## 2020-02-11 MED ORDER — ACETAMINOPHEN 500 MG PO TABS
1000.0000 mg | ORAL_TABLET | Freq: Once | ORAL | Status: AC
  Administered 2020-02-11: 1000 mg via ORAL
  Filled 2020-02-11: qty 2

## 2020-02-11 NOTE — Progress Notes (Addendum)
Patient admitted to the day hospital for treatment of sickle cell pain crisis. Patient reported generalized pain rated 8/10. Patient placed on Dilaudid PCA, given PO benadryl, PO Tylenol, IV Toradol and hydrated with IV fluids. At discharge patient reported  pain at 5/10. Declined printed AVS. Patient alert, oriented and ambulatory at discharge.

## 2020-02-11 NOTE — Discharge Summary (Signed)
[facility] Discharge Summary   Patient ID: Timothy Marks MRN: [id number] DOB/AGE: April 06, 1990 29 y.o.  Admit date: 02/11/2020 Discharge date: 02/11/2020  Primary Care Physician:  Pcp, No  Admission Diagnoses:  Principal Problem:   Sickle cell pain crisis [facility])   Discharge Medications:  Allergies as of 02/11/2020      Reactions   Buprenorphine Hcl Hives   Levaquin [levofloxacin] Itching   Meperidine Rash   Morphine Hives   Morphine And Related Hives   Toradol [ketorolac Tromethamine] Itching   Tramadol Hives   Vancomycin Itching   Zosyn [piperacillin Sod-tazobactam So] Itching, Rash, Other (See Comments)   Has taken rocephin in the past      Medication List    TAKE these medications   cholecalciferol 25 MCG (1000 UNIT) tablet Commonly known as: VITAMIN D Take 2 tablets (2,000 Units total) by mouth daily.   Ensure Active High Protein Liqd Take 1 each by mouth 3 (three) times daily between meals. #90 cans of Ensure What changed:   when to take this  additional instructions   folic acid 1 MG tablet Commonly known as: FOLVITE Take 1 tablet (1 mg total) by mouth daily.   gabapentin 300 MG capsule Commonly known as: NEURONTIN Take 1 capsule (300 mg total) by mouth 3 (three) times daily.   hydroxyurea 500 MG capsule Commonly known as: HYDREA Take 1 capsule (500 mg total) by mouth 2 (two) times daily. May take with food to minimize GI side effects. What changed: additional instructions   hydrOXYzine 10 MG tablet Commonly known as: ATARAX/VISTARIL Take 1 tablet (10 mg total) by mouth 3 (three) times daily as needed for itching.   oxyCODONE-acetaminophen 10-325 MG tablet Commonly known as: Percocet Take 1 tablet by mouth every 6 (six) hours as needed for up to 15 days for pain.   OxyCONTIN 20 mg 12 hr tablet Generic drug: oxyCODONE Take 1 tablet (20 mg total) by mouth every 12 (twelve) hours for 23 days.   rivaroxaban 20 MG Tabs  tablet Commonly known as: XARELTO Take 1 tablet (20 mg total) by mouth daily with supper.        Consults:  None  Significant Diagnostic Studies:  No results found.  History of present illness: Timothy Marks is a 29 year old male with a medical history significant for sickle cell disease, chronic pain syndrome, opiate dependence and tolerance and history of anemia of chronic disease presents with complaints of low back pain that is consistent with previous sickle cell pain crisis. Patient was recently diagnosed with COVID-19 infection on 01/22/2020.  Patient isolated at home for 2 weeks.  Patient received monoclonal antibody as an inpatient.  He has not identified any palliative or provocative factors concerning current crisis.  He awakened yesterday morning with increased pain that was unrelieved by home medications.  He last had oxycodone and OxyContin this a.m. without sustained relief.  Pain intensity is 9/10 characterized as constant and aching.  Patient denies any headaches, shortness of breath, chest pain, urinary symptoms, nausea, vomiting, or diarrhea.  Patient denies fever, however he endorses bouts of chills.  Patient has had no recent travel or sick contacts.   [facility] Course: Patient admitted to sickle cell day infusion clinic for management of pain crisis. On admission, patient was found to have a temperature of 102.  Temperature resolved following Tylenol.  WBCs within normal range.  Prior to discharge, patient afebrile without any signs of infection or inflammation. Pain  managed with IV Dilaudid via PCA with settings of 0.5 mg, 10-minute lockout, and 3 mg/h. IV fluids, 0.45% saline at 150 mL/h Toradol 15 mg IV x1 Tylenol 1000 mL by mouth x1. Pain intensity decreased to 4/10.  Patient is alert, oriented, and ambulating without assistance.  He will discharge home in hemodynamically stable condition.  Discharge instructions:  Resume all home medications.    Follow up with PCP as previously  scheduled.   Discussed the importance of drinking 64 ounces of water daily, dehydration of red blood cells may lead further sickling.   Avoid all stressors that precipitate sickle cell pain crisis.     The patient was given clear instructions to go to ER or return to medical center if symptoms do not improve, worsen or new problems develop.    Physical Exam at Discharge:  BP (!) 96/52 (BP Location: Right Arm)   Pulse 71   Temp 98.8 F (37.1 C) (Oral)   Resp 15   SpO2 93%  Physical Exam Constitutional:      Appearance: Normal appearance.  Eyes:     Pupils: Pupils are equal, round, and reactive to light.  Cardiovascular:     Rate and Rhythm: Normal rate and regular rhythm.  Pulmonary:     Effort: Pulmonary effort is normal.  Abdominal:     General: Abdomen is flat. Bowel sounds are normal.  Musculoskeletal:        General: Normal range of motion.  Skin:    General: Skin is warm.  Neurological:     General: No focal deficit present.     Mental Status: He is alert. Mental status is at baseline.  Psychiatric:        Mood and Affect: Mood normal.        Behavior: Behavior normal.        Thought Content: Thought content normal.        Judgment: Judgment normal.      Disposition at Discharge: Discharge disposition: 01-Home or Self Care       Discharge Orders: Discharge Instructions    Discharge patient   Complete by: As directed    Discharge disposition: 01-Home or Self Care   Discharge patient date: 02/11/2020      Condition at Discharge:   Stable  Time spent on Discharge:  Greater than 30 minutes.  Signed: [name]  APRN, MSN, FNP-C [facility] [address] [place], [place] [address] [phone]  02/11/2020, 5:07 PM

## 2020-02-11 NOTE — Discharge Instructions (Signed)
Sickle Cell Anemia, Adult  Sickle cell anemia is a condition where your red blood cells are shaped like sickles. Red blood cells carry oxygen through the body. Sickle-shaped cells do not live as long as normal red blood cells. They also clump together and block blood from flowing through the blood vessels. This prevents the body from getting enough oxygen. Sickle cell anemia causes organ damage and pain. It also increases the risk of infection. Follow these instructions at home: Medicines  Take over-the-counter and prescription medicines only as told by your doctor.  If you were prescribed an antibiotic medicine, take it as told by your doctor. Do not stop taking the antibiotic even if you start to feel better.  If you develop a fever, do not take medicines to lower the fever right away. Tell your doctor about the fever. Managing pain, stiffness, and swelling  Try these methods to help with pain: ? Use a heating pad. ? Take a warm bath. ? Distract yourself, such as by watching TV. Eating and drinking  Drink enough fluid to keep your pee (urine) clear or pale yellow. Drink more in hot weather and during exercise.  Limit or avoid alcohol.  Eat a healthy diet. Eat plenty of fruits, vegetables, whole grains, and lean protein.  Take vitamins and supplements as told by your doctor. Traveling  When traveling, keep these with you: ? Your medical information. ? The names of your doctors. ? Your medicines.  If you need to take an airplane, talk to your doctor first. Activity  Rest often.  Avoid exercises that make your heart beat much faster, such as jogging. General instructions  Do not use products that have nicotine or tobacco, such as cigarettes and e-cigarettes. If you need help quitting, ask your doctor.  Consider wearing a medical alert bracelet.  Avoid being in high places (high altitudes), such as mountains.  Avoid very hot or cold temperatures.  Avoid places where the  temperature changes a lot.  Keep all follow-up visits as told by your doctor. This is important. Contact a doctor if:  A joint hurts.  Your feet or hands hurt or swell.  You feel tired (fatigued). Get help right away if:  You have symptoms of infection. These include: ? Fever. ? Chills. ? Being very tired. ? Irritability. ? Poor eating. ? Throwing up (vomiting).  You feel dizzy or faint.  You have new stomach pain, especially on the left side.  You have a an erection (priapism) that lasts more than 4 hours.  You have numbness in your arms or legs.  You have a hard time moving your arms or legs.  You have trouble talking.  You have pain that does not go away when you take medicine.  You are short of breath.  You are breathing fast.  You have a long-term cough.  You have pain in your chest.  You have a bad headache.  You have a stiff neck.  Your stomach looks bloated even though you did not eat much.  Your skin is pale.  You suddenly cannot see well. Summary  Sickle cell anemia is a condition where your red blood cells are shaped like sickles.  Follow your doctor's advice on ways to manage pain, food to eat, activities to do, and steps to take for safe travel.  Get medical help right away if you have any signs of infection, such as a fever. This information is not intended to replace advice given to you by   your health care provider. Make sure you discuss any questions you have with your health care provider. Document Revised: 05/25/2018 Document Reviewed: 03/08/2016 Elsevier Patient Education  2020 Elsevier Inc.  

## 2020-02-11 NOTE — H&P (Signed)
[facility] History and Physical   Date: 02/11/2020  Patient name: Timothy Marks Medical record number: [id number] Date of birth: 01-26-1991 Age: 29 y.o. Gender: male PCP: Pcp, No  Attending physician: Tresa Garter, MD  Chief Complaint: Sickle cell pain   History of Present Illness: [name] is a 29 year old male with a medical history significant for sickle cell disease, chronic pain syndrome, opiate dependence and tolerance and history of anemia of chronic disease presents with complaints of low back pain that is consistent with previous sickle cell pain crisis. Patient was recently diagnosed with COVID-19 infection on 01/22/2020.  Patient isolated at home for 2 weeks.  Patient received monoclonal antibody as an inpatient.  He has not identified any palliative or provocative factors concerning current crisis.  He awakened yesterday morning with increased pain that was unrelieved by home medications.  He last had oxycodone and OxyContin this a.m. without sustained relief.  Pain intensity is 9/10 characterized as constant and aching.  Patient denies any headaches, shortness of breath, chest pain, urinary symptoms, nausea, vomiting, or diarrhea.  Patient denies fever, however he endorses bouts of chills.  Patient has had no recent travel or sick contacts.  Meds: Medications Prior to Admission  Medication Sig Dispense Refill Last Dose  . cholecalciferol (VITAMIN D) 1000 units tablet Take 2 tablets (2,000 Units total) by mouth daily. 30 tablet 6   . folic acid (FOLVITE) 1 MG tablet Take 1 tablet (1 mg total) by mouth daily. 90 tablet 2   . gabapentin (NEURONTIN) 300 MG capsule Take 1 capsule (300 mg total) by mouth 3 (three) times daily. 90 capsule 5   . hydroxyurea (HYDREA) 500 MG capsule Take 1 capsule (500 mg total) by mouth 2 (two) times daily. May take with food to minimize GI side effects. (Patient taking differently: Take 500 mg by mouth 2 (two) times daily. )  180 capsule 2   . hydrOXYzine (ATARAX/VISTARIL) 10 MG tablet Take 1 tablet (10 mg total) by mouth 3 (three) times daily as needed for itching. 30 tablet 0   . Nutritional Supplements (ENSURE ACTIVE HIGH PROTEIN) LIQD Take 1 each by mouth 3 (three) times daily between meals. #90 cans of Ensure (Patient taking differently: Take 1 each by mouth See admin instructions. 5 times daily) 237 mL 11   . oxyCODONE-acetaminophen (PERCOCET) 10-325 MG tablet Take 1 tablet by mouth every 6 (six) hours as needed for up to 15 days for pain. 60 tablet 0   . OXYCONTIN 20 MG 12 hr tablet Take 1 tablet (20 mg total) by mouth every 12 (twelve) hours for 23 days. 46 tablet 0   . rivaroxaban (XARELTO) 20 MG TABS tablet Take 1 tablet (20 mg total) by mouth daily with supper. 68 tablet 0     Allergies: Buprenorphine hcl, Levaquin [levofloxacin], Meperidine, Morphine, Morphine and related, Toradol [ketorolac tromethamine], Tramadol, Vancomycin, and Zosyn [piperacillin sod-tazobactam so] Past Medical History:  Diagnosis Date  . Depression   . Generalized weakness 03/31/2019  . 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 Sickle cell anemia (HCC)   . Vitamin D deficiency y-6   Past Surgical History:  Procedure Laterality Date  . CHOLECYSTECTOMY     Family History  Problem Relation Age of Onset  . Sickle cell trait Mother   . Sickle cell trait Father    Social History   Socioeconomic History  . Marital status: Single  Spouse name: Not on file  . Number of children: Not on file  . Years of education: Not on file  . Highest education level: Not on file  Occupational History  . Not on file  Tobacco Use  . Smoking status: Never Smoker  . Smokeless tobacco: Never Used  Vaping Use  . Vaping Use: Never used  Substance and Sexual Activity  . Alcohol use: No  . Drug use: Not Currently    Types: Marijuana  . Sexual activity: Not on file  Other Topics  Concern  . Not on file  Social History Narrative  . Not on file   Social Determinants of Health   Financial Resource Strain: Not on file  Food Insecurity: Not on file  Transportation Needs: Not on file  Physical Activity: Not on file  Stress: Not on file  Social Connections: Not on file  Intimate Partner Violence: Not on file   Review of Systems  Constitutional: Negative for chills and fever.  HENT: Negative.   Eyes: Negative.   Respiratory: Negative.   Cardiovascular: Negative.   Genitourinary: Negative.   Musculoskeletal: Positive for back pain and joint pain.  Skin: Negative.   Neurological: Negative.   Psychiatric/Behavioral: Negative.     Physical Exam: Blood pressure (!) 96/52, pulse 71, temperature 98.8 F (37.1 C), temperature source Oral, resp. rate 15, SpO2 93 %. Physical Exam Constitutional:      Appearance: Normal appearance.  Eyes:     Pupils: Pupils are equal, round, and reactive to light.  Cardiovascular:     Rate and Rhythm: Normal rate and regular rhythm.     Pulses: Normal pulses.  Pulmonary:     Effort: Pulmonary effort is normal.  Abdominal:     General: Abdomen is flat. Bowel sounds are normal.  Musculoskeletal:        General: Normal range of motion.  Skin:    General: Skin is warm.  Neurological:     General: No focal deficit present.     Mental Status: He is alert. Mental status is at baseline.  Psychiatric:        Mood and Affect: Mood normal.        Thought Content: Thought content normal.        Judgment: Judgment normal.      Lab results: No results found for this or any previous visit (from the past 24 hour(s)).  Imaging results:  No results found.   Assessment & Plan: Patient admitted to sickle cell day infusion center for management of pain crisis.  Patient is opiate tolerant Initiate IV dilaudid PCA. Settings of 0.5 mg, 10 minute lockout and 3 mg/hr IV fluids, 0.45% saline at 150 ml/hr Toradol 15 mg IV times one  dose Tylenol 1000 mg by mouth times one dose Review CBC with differential, complete metabolic panel, and reticulocytes as results become available.  Pain intensity will be reevaluated in context of functioning and relationship to baseline as care progresses If pain intensity remains elevated and/or sudden change in hemodynamic stability transition to inpatient services for higher level of care.   [name]  APRN, MSN, FNP-C [facility] [address] [place], [place] [address] [phone]   02/11/2020, 9:19 AM

## 2020-02-11 NOTE — Telephone Encounter (Signed)
Patient called, requesting to come to the day hospital due to generalized pain rated at 8/10. Denied chest pain, fever, diarrhea, abdominal pain, nausea/vomitting and priapism. Screened negative for Covid-19 symptoms. Had Covid, out of quarantine, the last Covid test at [facility] in [facility] in [place] on December 21st was negative. Admitted to having means of transportation without driving self after treatment. Last took 20 mg of oxycontin at 05:00 am today. Currently "out" of percocet, will be due for refill this Friday. Per provider, patient can come to the day hospital for treatment. Patient notified, verbalized understanding.

## 2020-02-21 ENCOUNTER — Other Ambulatory Visit: Payer: Self-pay | Admitting: Internal Medicine

## 2020-02-21 ENCOUNTER — Telehealth: Payer: Self-pay | Admitting: Internal Medicine

## 2020-02-21 DIAGNOSIS — G894 Chronic pain syndrome: Secondary | ICD-10-CM

## 2020-02-21 DIAGNOSIS — D571 Sickle-cell disease without crisis: Secondary | ICD-10-CM

## 2020-02-21 MED ORDER — OXYCODONE-ACETAMINOPHEN 10-325 MG PO TABS: 1.0000 | ORAL_TABLET | Freq: Four times a day (QID) | ORAL | 0 refills | Status: DC | PRN

## 2020-02-21 NOTE — Telephone Encounter (Signed)
Refilled OxyCodone, OxyContin is not due for refill.

## 2020-02-24 ENCOUNTER — Telehealth: Payer: Self-pay | Admitting: Internal Medicine

## 2020-02-25 ENCOUNTER — Other Ambulatory Visit: Payer: Self-pay | Admitting: Internal Medicine

## 2020-02-25 DIAGNOSIS — Z79891 Long term (current) use of opiate analgesic: Secondary | ICD-10-CM

## 2020-02-25 DIAGNOSIS — G894 Chronic pain syndrome: Secondary | ICD-10-CM

## 2020-02-25 MED ORDER — OXYCONTIN 20 MG PO T12A
20.0000 mg | EXTENDED_RELEASE_TABLET | Freq: Two times a day (BID) | ORAL | 0 refills | Status: DC
Start: 2020-02-26 — End: 2020-03-27

## 2020-02-25 NOTE — Telephone Encounter (Signed)
Refilled, starting from tomorrow

## 2020-02-27 ENCOUNTER — Encounter: Payer: Self-pay | Admitting: Internal Medicine

## 2020-02-27 ENCOUNTER — Other Ambulatory Visit: Payer: Self-pay

## 2020-02-27 ENCOUNTER — Ambulatory Visit (INDEPENDENT_AMBULATORY_CARE_PROVIDER_SITE_OTHER): Payer: Medicare Other | Admitting: Internal Medicine

## 2020-02-27 VITALS — BP 124/65 | HR 66 | Temp 97.8°F | Ht 74.0 in | Wt 115.6 lb

## 2020-02-27 DIAGNOSIS — D571 Sickle-cell disease without crisis: Secondary | ICD-10-CM

## 2020-02-27 DIAGNOSIS — F1290 Cannabis use, unspecified, uncomplicated: Secondary | ICD-10-CM

## 2020-02-27 DIAGNOSIS — Z23 Encounter for immunization: Secondary | ICD-10-CM

## 2020-02-27 DIAGNOSIS — G894 Chronic pain syndrome: Secondary | ICD-10-CM

## 2020-02-27 LAB — POCT URINALYSIS DIPSTICK
Glucose, UA: NEGATIVE
Ketones, UA: NEGATIVE
Leukocytes, UA: NEGATIVE
Nitrite, UA: NEGATIVE
Protein, UA: POSITIVE — AB
Spec Grav, UA: 1.025 (ref 1.010–1.025)
Urobilinogen, UA: 1 E.U./dL
pH, UA: 6 (ref 5.0–8.0)

## 2020-02-27 NOTE — Patient Instructions (Signed)
Influenza, Adult Influenza is also called "the flu." It is an infection in the lungs, nose, and throat (respiratory tract). It spreads easily from person to person (is contagious). The flu causes symptoms that are like a cold, along with high fever and body aches. What are the causes? This condition is caused by the influenza virus. You can get the virus by:  Breathing in droplets that are in the air after a person infected with the flu coughed or sneezed.  Touching something that has the virus on it and then touching your mouth, nose, or eyes. What increases the risk? Certain things may make you more likely to get the flu. These include:  Not washing your hands often.  Having close contact with many people during cold and flu season.  Touching your mouth, eyes, or nose without first washing your hands.  Not getting a flu shot every year. You may have a higher risk for the flu, and serious problems, such as a lung infection (pneumonia), if you:  Are older than 65.  Are pregnant.  Have a weakened disease-fighting system (immune system) because of a disease or because you are taking certain medicines.  Have a long-term (chronic) condition, such as: ? Heart, kidney, or lung disease. ? Diabetes. ? Asthma.  Have a liver disorder.  Are very overweight (morbidly obese).  Have anemia. What are the signs or symptoms? Symptoms usually begin suddenly and last 4-14 days. They may include:  Fever and chills.  Headaches, body aches, or muscle aches.  Sore throat.  Cough.  Runny or stuffy (congested) nose.  Feeling discomfort in your chest.  Not wanting to eat as much as normal.  Feeling weak or tired.  Feeling dizzy.  Feeling sick to your stomach or throwing up. How is this treated? If the flu is found early, you can be treated with antiviral medicine. This can help to reduce how bad the illness is and how long it lasts. This may be given by mouth or through an IV  tube. Taking care of yourself at home can help your symptoms get better. Your doctor may want you to:  Take over-the-counter medicines.  Drink plenty of fluids. The flu often goes away on its own. If you have very bad symptoms or other problems, you may be treated in a hospital. Follow these instructions at home: Activity  Rest as needed. Get plenty of sleep.  Stay home from work or school as told by your doctor. ? Do not leave home until you do not have a fever for 24 hours without taking medicine. ? Leave home only to go to your doctor. Eating and drinking  Take an ORS (oral rehydration solution). This is a drink that is sold at pharmacies and stores.  Drink enough fluid to keep your pee pale yellow.  Drink clear fluids in small amounts as you are able. Clear fluids include: ? Water. ? Ice chips. ? Fruit juice mixed with water. ? Low-calorie sports drinks.  Eat bland foods that are easy to digest. Eat small amounts as you are able. These foods include: ? Bananas. ? Applesauce. ? Rice. ? Lean meats. ? Toast. ? Crackers.  Do not eat or drink: ? Fluids that have a lot of sugar or caffeine. ? Alcohol. ? Spicy or fatty foods. General instructions  Take over-the-counter and prescription medicines only as told by your doctor.  Use a cool mist humidifier to add moisture to the air in your home. This can make it   easier for you to breathe. ? When using a cool mist humidifier, clean it daily. Empty water and replace with clean water.  Cover your mouth and nose when you cough or sneeze.  Wash your hands with soap and water often and for at least 20 seconds. This is also important after you cough or sneeze. If you cannot use soap and water, use alcohol-based hand sanitizer.  Keep all follow-up visits.      How is this prevented?  Get a flu shot every year. You may get the flu shot in late summer, fall, or winter. Ask your doctor when you should get your flu shot.  Avoid  contact with people who are sick during fall and winter. This is cold and flu season.   Contact a doctor if:  You get new symptoms.  You have: ? Chest pain. ? Watery poop (diarrhea). ? A fever.  Your cough gets worse.  You start to have more mucus.  You feel sick to your stomach.  You throw up. Get help right away if you:  Have shortness of breath.  Have trouble breathing.  Have skin or nails that turn a bluish color.  Have very bad pain or stiffness in your neck.  Get a sudden headache.  Get sudden pain in your face or ear.  Cannot eat or drink without throwing up. These symptoms may represent a serious problem that is an emergency. Get medical help right away. Call your local emergency services (911 in the [place]  Do not wait to see if the symptoms will go away.  Do not drive yourself to the hospital. Summary  Influenza is also called "the flu." It is an infection in the lungs, nose, and throat. It spreads easily from person to person.  Take over-the-counter and prescription medicines only as told by your doctor.  Getting a flu shot every year is the best way to not get the flu. This information is not intended to replace advice given to you by your health care provider. Make sure you discuss any questions you have with your health care provider. Document Revised: 09/20/2019 Document Reviewed: 09/20/2019 Elsevier Patient Education  2021 [place]. Sickle Cell Anemia, Adult  Sickle cell anemia is a condition in which red blood cells have an abnormal "sickle" shape. Red blood cells carry oxygen through the body. Sickle-shaped red blood cells do not live as long as normal red blood cells. They also clump together and block blood from flowing through the blood vessels. This condition prevents the body from getting enough oxygen. Sickle cell anemia causes organ damage and pain. It also increases the risk of infection. What are the causes? This condition is caused  by a gene that is passed from parent to child (inherited). Receiving two copies of the gene causes the disease. Receiving one copy causes the "trait," which means that symptoms are milder or not present. What increases the risk? This condition is more likely to develop if your ancestors were from [place], the [place], [place] or [place], the [place], [place], or the [place]. What are the signs or symptoms? Symptoms of this condition include:  Episodes of pain (crises), especially in the hands and feet, joints, back, chest, or abdomen. The pain can be triggered by: ? An illness, especially if there is dehydration. ? Doing an activity with great effort (overexertion). ? Exposure to extreme temperature changes. ? High altitude.  Fatigue.  Shortness of breath or difficulty breathing.  Dizziness.  Pale skin  or yellowed skin (jaundice).  Frequent bacterial infections.  Pain and swelling in the hands and feet (hand-food syndrome).  Prolonged, painful erection of the penis (priapism).  Acute chest syndrome. Symptoms of this include: ? Chest pain. ? Fever. ? Cough. ? Fast breathing.  Stroke.  Decreased activity.  Loss of appetite.  Change in behavior.  Headaches.  Seizures.  Vision changes.  Skin ulcers.  Heart disease.  High blood pressure.  Gallstones.  Liver and kidney problems. How is this diagnosed? This condition is diagnosed with blood tests that check for the gene that causes this condition. How is this treated? There is no cure for most cases of this condition. Treatment focuses on managing your symptoms and preventing complications of the disease. Your health care provider will work with you to identify the best treatment options for you based on an assessment of your condition. Treatment may include:  Medicines, including: ? Pain medicines. ? Antibiotic medicines for infection. ? Medicines to increase the production of a protein in red  blood cells that helps carry oxygen in the body (hemoglobin).  Fluids to treat pain and swelling.  Oxygen to treat acute chest syndrome.  Blood transfusions to treat symptoms such as fatigue, stroke, and acute chest syndrome.  Massage and physical therapy for pain.  Regular tests to monitor your condition, such as blood tests, X-rays, CT scans, MRI scans, ultrasounds, and lung function tests. These should be done every 3-12 months, depending on your age.  Hematopoietic stem cell transplant. This is a procedure to replace abnormal stem cells with healthy stem cells from a donor's bone marrow. Stem cells are cells that can develop into blood cells, and bone marrow is the spongy tissue inside the bones. Follow these instructions at home: Medicines  Take over-the-counter and prescription medicines only as told by your health care provider.  If you were prescribed an antibiotic medicine, take it as told by your health care provider. Do not stop taking the antibiotic even if you start to feel better.  If you develop a fever, do not take medicines to reduce the fever right away. This could cover up another problem. Notify your health care provider. Managing pain, stiffness, and swelling  Try these methods to help ease your pain: ? Using a heating pad. ? Taking a warm bath. ? Distracting yourself, such as by watching TV. Eating and drinking  Drink enough fluid to keep your urine clear or pale yellow. Drink more in hot weather and during exercise.  Limit or avoid drinking alcohol.  Eat a balanced and nutritious diet. Eat plenty of fruits, vegetables, whole grains, and lean protein.  Take vitamins and supplements as directed by your health care provider. Traveling  When traveling, keep these with you: ? Your medical information. ? The names of your health care providers. ? Your medicines.  If you have to travel by air, ask about precautions you should take. Activity  Get plenty of  rest.  Avoid activities that will lower your oxygen levels, such as exercising vigorously. General instructions  Do not use any products that contain nicotine or tobacco, such as cigarettes and e-cigarettes. They lower blood oxygen levels. If you need help quitting, ask your health care provider.  Consider wearing a medical alert bracelet.  Avoid high altitudes.  Avoid extreme temperatures and extreme temperature changes.  Keep all follow-up visits as told by your health care provider. This is important. Contact a health care provider if:  You develop joint pain.  Your feet or hands swell or have pain.  You have fatigue. Get help right away if:  You have symptoms of infection. These include: ? Fever. ? Chills. ? Extreme tiredness. ? Irritability. ? Poor eating. ? Vomiting.  You feel dizzy or faint.  You have new abdominal pain, especially on the left side near the stomach area.  You develop priapism.  You have numbness in your arms or legs or have trouble moving them.  You have trouble talking.  You develop pain that cannot be controlled with medicine.  You become short of breath.  You have rapid breathing.  You have a persistent cough.  You have pain in your chest.  You develop a severe headache or stiff neck.  You feel bloated without eating or after eating a small amount of food.  Your skin is pale.  You suddenly lose vision. Summary  Sickle cell anemia is a condition in which red blood cells have an abnormal "sickle" shape. This disease can cause organ damage and chronic pain, and it can raise your risk of infection.  Sickle cell anemia is a genetic disorder.  Treatment focuses on managing your symptoms and preventing complications of the disease.  Get medical help right away if you have any signs of infection, such as a fever. This information is not intended to replace advice given to you by your health care provider. Make sure you discuss any  questions you have with your health care provider. Document Revised: 06/27/2019 Document Reviewed: 06/27/2019 Elsevier Patient Education  [place].

## 2020-02-27 NOTE — Progress Notes (Signed)
Timothy Marks, is a 30 y.o. male  WFU:[id number]  KGU:[id number]  DOB - 06-29-1990  Chief Complaint  Patient presents with  . Follow-up    Follow up 6 week follow up        Subjective:   Timothy Marks is a 30 y.o. male with medical history significant for sickle cell disease, hemoglobin SS, major depression and vitamin D deficiency who presented here today for a routine follow up visit. Patient has no new complaint today. He claims his pain is controlled. He does not need medication refill today. He is up-to-date with his vaccinations except for flu vaccine and he is ready to get it today. He claims adherence with all his home medications.  He denies any fever, cough, chest pain. Patient has No headache, No abdominal pain - No Nausea, No new weakness tingling or numbness, No Cough - SOB.  No problems updated.  ALLERGIES: Allergies  Allergen Reactions  . Buprenorphine Hcl Hives  . Levaquin [Levofloxacin] Itching  . Meperidine Rash  . Morphine Hives  . Morphine And Related Hives  . Toradol [Ketorolac Tromethamine] Itching  . Tramadol Hives  . Vancomycin Itching  . Zosyn [Piperacillin Sod-Tazobactam So] Itching, Rash and Other (See Comments)    Has taken rocephin in the past    PAST MEDICAL HISTORY: Past Medical History:  Diagnosis Date  . Depression   . Generalized weakness 03/31/2019  . 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 Sickle cell anemia (HCC)   . Vitamin D deficiency y-6    MEDICATIONS AT HOME: Prior to Admission medications   Medication Sig Start Date End Date Taking? Authorizing Provider  cholecalciferol (VITAMIN D) 1000 units tablet Take 2 tablets (2,000 Units total) by mouth daily. 12/13/17  Yes Azzie Glatter, FNP  folic acid (FOLVITE) 1 MG tablet Take 1 tablet (1 mg total) by mouth daily. 05/01/17  Yes Dorena Dew, FNP  gabapentin (NEURONTIN) 300 MG capsule Take 1 capsule (300 mg  total) by mouth 3 (three) times daily. 03/12/19  Yes Dorena Dew, FNP  hydroxyurea (HYDREA) 500 MG capsule Take 1 capsule (500 mg total) by mouth 2 (two) times daily. May take with food to minimize GI side effects. Patient taking differently: Take 500 mg by mouth 2 (two) times daily. 05/01/17  Yes Dorena Dew, FNP  hydrOXYzine (ATARAX/VISTARIL) 10 MG tablet Take 1 tablet (10 mg total) by mouth 3 (three) times daily as needed for itching. 01/16/20  Yes Tresa Garter, MD  Nutritional Supplements (ENSURE ACTIVE HIGH PROTEIN) LIQD Take 1 each by mouth 3 (three) times daily between meals. #90 cans of Ensure Patient taking differently: Take 1 each by mouth See admin instructions. 5 times daily 11/13/18  Yes Dorena Dew, FNP  oxyCODONE-acetaminophen (PERCOCET) 10-325 MG tablet Take 1 tablet by mouth every 6 (six) hours as needed for up to 15 days for pain. 02/21/20 03/07/20 Yes Cuong Moorman E, MD  OXYCONTIN 20 MG 12 hr tablet Take 1 tablet (20 mg total) by mouth every 12 (twelve) hours. 02/26/20 03/27/20 Yes Tresa Garter, MD  rivaroxaban (XARELTO) 20 MG TABS tablet Take 1 tablet (20 mg total) by mouth daily with supper. 10/29/19 01/22/20  Vevelyn Francois, NP    Objective:   Vitals:   02/27/20 0830  BP: 124/65  Pulse: 66  Temp: 97.8 F (36.6 C)  TempSrc: Temporal  SpO2: 97%  Weight: 115 lb 9.6 oz (  52.4 kg)  Height: 6\' 2"  (1.88 m)   Exam General appearance : Awake, alert, not in any distress. Speech Clear. Not toxic looking HEENT: Atraumatic and Normocephalic, pupils equally reactive to light and accomodation Neck: Supple, no JVD. No cervical lymphadenopathy.  Chest: Good air entry bilaterally, no added sounds  CVS: S1 S2 regular, no murmurs.  Abdomen: Bowel sounds present, Non tender and not distended with no gaurding, rigidity or rebound. Extremities: B/L Lower Ext shows no edema, both legs are warm to touch Neurology: Awake alert, and oriented X 3, CN II-XII  intact, Non focal Skin: No Rash  Data Review No results found for: HGBA1C  Assessment & Plan   1. Hb-SS disease without crisis ([place]) Continue Hydrea. We discussed the need for good hydration, monitoring of hydration status, avoidance of heat, cold, stress, and infection triggers. We discussed the risks and benefits of Hydrea, including bone marrow suppression, the possibility of GI upset, skin ulcers, hair thinning, and teratogenicity. The patient was reminded of the need to seek medical attention for any symptoms of bleeding, anemia, or infection. Continue folic acid 1 mg daily to prevent aplastic bone marrow crises.  Pulmonary evaluation - Patient denies severe recurrent wheezes, shortness of breath with exercise, or persistent cough. If these symptoms develop, pulmonary function tests with spirometry will be ordered, and if abnormal, plan on referral to Pulmonology for further evaluation.  Eye - High risk of proliferative retinopathy. Annual eye exam with retinal exam recommended to patient, the patient has had eye exam this year.  Immunization status - Yearly influenza vaccination is recommended, as well as being up to date with Meningococcal and Pneumococcal vaccines.   2. Chronic pain syndrome We agreed on Opiate dose and amount of pills  per month. We discussed that pt is to receive Schedule II prescriptions only from our clinic. Pt is also aware that the prescription history is available to [place] online through the [facility] CSRS. Controlled substance agreement reviewed and signed. We reminded Dessie that all patients receiving Schedule II narcotics must be seen for follow within one month of prescription being requested. We reviewed the terms of our pain agreement, including the need to keep medicines in a safe locked location away from children or pets, and the need to report excess sedation or constipation, measures to avoid constipation, and policies related to early refills and stolen  prescriptions. According to the [place] [facility] program, we have reviewed details related to analgesia, adverse effects and aberrant behaviors.  3. Marijuana and Tobacco use Disorder Ernestine was counseled on the dangers of tobacco use, and was advised to quit. Reviewed strategies to maximize success, including removing cigarettes and smoking materials from environment, stress management and support of family/friends.  Patient have been counseled extensively about nutrition and exercise. Other issues discussed during this visit include: low cholesterol diet, weight control and daily exercise,  annual eye examinations at Ophthalmology, importance of adherence with medications and regular follow-up.  Return in about 2 months (around 04/26/2020), or if symptoms worsen or fail to improve, for Sickle Cell Disease/Pain.  The patient was given clear instructions to go to ER or return to medical center if symptoms don't improve, worsen or new problems develop. The patient verbalized understanding. The patient was told to call to get lab results if they haven't heard anything in the next week.   This note has been created with Surveyor, quantity. Any transcriptional errors are unintentional.  Angelica Chessman, MD, MHA, Karilyn Cota, [place] and [place], [place]   02/27/2020, 8:53 AM

## 2020-03-06 ENCOUNTER — Telehealth: Payer: Self-pay | Admitting: Internal Medicine

## 2020-03-08 ENCOUNTER — Other Ambulatory Visit: Payer: Self-pay

## 2020-03-08 ENCOUNTER — Other Ambulatory Visit: Payer: Self-pay | Admitting: Internal Medicine

## 2020-03-08 DIAGNOSIS — G894 Chronic pain syndrome: Secondary | ICD-10-CM

## 2020-03-08 DIAGNOSIS — D571 Sickle-cell disease without crisis: Secondary | ICD-10-CM

## 2020-03-08 MED ORDER — OXYCODONE-ACETAMINOPHEN 10-325 MG PO TABS: 1.0000 | ORAL_TABLET | Freq: Four times a day (QID) | ORAL | 0 refills | Status: DC | PRN

## 2020-03-08 NOTE — Telephone Encounter (Signed)
Refilled

## 2020-03-11 ENCOUNTER — Telehealth: Payer: Self-pay | Admitting: Internal Medicine

## 2020-03-11 ENCOUNTER — Other Ambulatory Visit: Payer: Self-pay | Admitting: Internal Medicine

## 2020-03-11 DIAGNOSIS — Z86718 Personal history of other venous thrombosis and embolism: Secondary | ICD-10-CM

## 2020-03-11 MED ORDER — RIVAROXABAN 20 MG PO TABS: 20.0000 mg | ORAL_TABLET | Freq: Every day | ORAL | 0 refills | Status: DC

## 2020-03-11 NOTE — Telephone Encounter (Signed)
Refilled

## 2020-03-12 ENCOUNTER — Telehealth (HOSPITAL_COMMUNITY): Payer: Self-pay

## 2020-03-12 ENCOUNTER — Non-Acute Institutional Stay (HOSPITAL_BASED_OUTPATIENT_CLINIC_OR_DEPARTMENT_OTHER)
Admission: AD | Admit: 2020-03-12 | Discharge: 2020-03-12 | Disposition: A | Discharge status: Home / Self Care | Payer: Medicare Other | Source: Ambulatory Visit | Attending: Internal Medicine | Admitting: Internal Medicine

## 2020-03-12 DIAGNOSIS — G894 Chronic pain syndrome: Secondary | ICD-10-CM | POA: Insufficient documentation

## 2020-03-12 DIAGNOSIS — Z880 Allergy status to penicillin: Secondary | ICD-10-CM | POA: Insufficient documentation

## 2020-03-12 DIAGNOSIS — Z881 Allergy status to other antibiotic agents status: Secondary | ICD-10-CM | POA: Insufficient documentation

## 2020-03-12 DIAGNOSIS — Z79899 Other long term (current) drug therapy: Secondary | ICD-10-CM | POA: Insufficient documentation

## 2020-03-12 DIAGNOSIS — D5700 Hb-SS disease with crisis, unspecified: Secondary | ICD-10-CM | POA: Diagnosis not present

## 2020-03-12 DIAGNOSIS — Z9049 Acquired absence of other specified parts of digestive tract: Secondary | ICD-10-CM | POA: Insufficient documentation

## 2020-03-12 DIAGNOSIS — M79606 Pain in leg, unspecified: Secondary | ICD-10-CM | POA: Insufficient documentation

## 2020-03-12 DIAGNOSIS — F1120 Opioid dependence, uncomplicated: Secondary | ICD-10-CM | POA: Insufficient documentation

## 2020-03-12 DIAGNOSIS — Z888 Allergy status to other drugs, medicaments and biological substances status: Secondary | ICD-10-CM | POA: Insufficient documentation

## 2020-03-12 DIAGNOSIS — D638 Anemia in other chronic diseases classified elsewhere: Secondary | ICD-10-CM | POA: Insufficient documentation

## 2020-03-12 DIAGNOSIS — Z7901 Long term (current) use of anticoagulants: Secondary | ICD-10-CM | POA: Insufficient documentation

## 2020-03-12 LAB — COMPREHENSIVE METABOLIC PANEL WITH GFR
ALT: 11 U/L (ref 0–44)
AST: 27 U/L (ref 15–41)
Albumin: 4.2 g/dL (ref 3.5–5.0)
Alkaline Phosphatase: 84 U/L (ref 38–126)
Anion gap: 10 (ref 5–15)
BUN: <5 mg/dL — ABNORMAL LOW (ref 6–20)
CO2: 26 mmol/L (ref 22–32)
Calcium: 9 mg/dL (ref 8.9–10.3)
Chloride: 103 mmol/L (ref 98–111)
Creatinine, Ser: 0.64 mg/dL (ref 0.61–1.24)
GFR, Estimated: >60 mL/min
Glucose, Bld: 104 mg/dL — ABNORMAL HIGH (ref 70–99)
Potassium: 3.3 mmol/L — ABNORMAL LOW (ref 3.5–5.1)
Sodium: 139 mmol/L (ref 135–145)
Total Bilirubin: 3.2 mg/dL — ABNORMAL HIGH (ref 0.3–1.2)
Total Protein: 7.7 g/dL (ref 6.5–8.1)

## 2020-03-12 LAB — CBC WITH DIFFERENTIAL/PLATELET
Abs Immature Granulocytes: 0.4 K/uL — ABNORMAL HIGH (ref 0.00–0.07)
Basophils Absolute: 0 K/uL (ref 0.0–0.1)
Basophils Relative: 0 %
Eosinophils Absolute: 0.3 K/uL (ref 0.0–0.5)
Eosinophils Relative: 2 %
HCT: 26.1 % — ABNORMAL LOW (ref 39.0–52.0)
Hemoglobin: 9.1 g/dL — ABNORMAL LOW (ref 13.0–17.0)
Immature Granulocytes: 3 %
Lymphocytes Relative: 19 %
Lymphs Abs: 2.3 K/uL (ref 0.7–4.0)
MCH: 31.1 pg (ref 26.0–34.0)
MCHC: 34.9 g/dL (ref 30.0–36.0)
MCV: 89.1 fL (ref 80.0–100.0)
Monocytes Absolute: 1.2 K/uL — ABNORMAL HIGH (ref 0.1–1.0)
Monocytes Relative: 10 %
Neutro Abs: 8 K/uL — ABNORMAL HIGH (ref 1.7–7.7)
Neutrophils Relative %: 66 %
Platelets: 349 K/uL (ref 150–400)
RBC: 2.93 MIL/uL — ABNORMAL LOW (ref 4.22–5.81)
RDW: 20 % — ABNORMAL HIGH (ref 11.5–15.5)
WBC: 12.2 K/uL — ABNORMAL HIGH (ref 4.0–10.5)
nRBC: 2.1 % — ABNORMAL HIGH (ref 0.0–0.2)

## 2020-03-12 LAB — RAPID URINE DRUG SCREEN, HOSP PERFORMED
Amphetamines: NOT DETECTED
Barbiturates: NOT DETECTED
Benzodiazepines: NOT DETECTED
Cocaine: NOT DETECTED
Opiates: POSITIVE — AB
Tetrahydrocannabinol: POSITIVE — AB

## 2020-03-12 LAB — RETICULOCYTES
Immature Retic Fract: 34.1 % — ABNORMAL HIGH (ref 2.3–15.9)
RBC.: 2.94 MIL/uL — ABNORMAL LOW (ref 4.22–5.81)
Retic Count, Absolute: 348 10*3/uL — ABNORMAL HIGH (ref 19.0–186.0)
Retic Ct Pct: 11.8 % — ABNORMAL HIGH (ref 0.4–3.1)

## 2020-03-12 MED ORDER — SODIUM CHLORIDE 0.45 % IV SOLN: INTRAVENOUS | Status: DC

## 2020-03-12 MED ORDER — SODIUM CHLORIDE 0.9% FLUSH: 9.0000 mL | INTRAVENOUS | Status: DC | PRN

## 2020-03-12 MED ORDER — KETOROLAC TROMETHAMINE 30 MG/ML IJ SOLN
15.0000 mg | Freq: Once | INTRAMUSCULAR | Status: AC
  Administered 2020-03-12: 15 mg via INTRAVENOUS
  Filled 2020-03-12: qty 1

## 2020-03-12 MED ORDER — ONDANSETRON HCL 4 MG/2ML IJ SOLN
4.0000 mg | Freq: Four times a day (QID) | INTRAMUSCULAR | Status: DC | PRN
  Administered 2020-03-12: 4 mg via INTRAVENOUS
  Filled 2020-03-12: qty 2

## 2020-03-12 MED ORDER — NALOXONE HCL 0.4 MG/ML IJ SOLN: 0.4000 mg | INTRAMUSCULAR | Status: DC | PRN

## 2020-03-12 MED ORDER — ACETAMINOPHEN 500 MG PO TABS
1000.0000 mg | ORAL_TABLET | Freq: Once | ORAL | Status: AC
  Administered 2020-03-12: 1000 mg via ORAL
  Filled 2020-03-12: qty 2

## 2020-03-12 MED ORDER — HYDROMORPHONE 1 MG/ML IV SOLN
INTRAVENOUS | Status: DC
  Administered 2020-03-12: 30 mg via INTRAVENOUS
  Administered 2020-03-12: 8.5 mg via INTRAVENOUS
  Filled 2020-03-12: qty 30

## 2020-03-12 MED ORDER — DIPHENHYDRAMINE HCL 25 MG PO CAPS
25.0000 mg | ORAL_CAPSULE | Freq: Once | ORAL | Status: AC
  Administered 2020-03-12: 25 mg via ORAL
  Filled 2020-03-12: qty 1

## 2020-03-12 NOTE — Discharge Summary (Signed)
[facility] Discharge Summary   Patient ID: Timothy Marks MRN: [id number] DOB/AGE: Dec 07, 1990 30 y.o.  Admit date: 03/12/2020 Discharge date: 03/12/2020  Primary Care Physician:  Tresa Garter, MD  Admission Diagnoses:  Active Problems:   Sickle cell pain crisis [facility])   Discharge Medications:  Allergies as of 03/12/2020      Reactions   Buprenorphine Hcl Hives   Levaquin [levofloxacin] Itching   Meperidine Rash   Morphine Hives   Morphine And Related Hives   Toradol [ketorolac Tromethamine] Itching   Tramadol Hives   Vancomycin Itching   Zosyn [piperacillin Sod-tazobactam So] Itching, Rash, Other (See Comments)   Has taken rocephin in the past      Medication List    TAKE these medications   cholecalciferol 25 MCG (1000 UNIT) tablet Commonly known as: VITAMIN D Take 2 tablets (2,000 Units total) by mouth daily.   Ensure Active High Protein Liqd Take 1 each by mouth 3 (three) times daily between meals. #90 cans of Ensure What changed:   when to take this  additional instructions   folic acid 1 MG tablet Commonly known as: FOLVITE Take 1 tablet (1 mg total) by mouth daily.   gabapentin 300 MG capsule Commonly known as: NEURONTIN Take 1 capsule (300 mg total) by mouth 3 (three) times daily.   hydroxyurea 500 MG capsule Commonly known as: HYDREA Take 1 capsule (500 mg total) by mouth 2 (two) times daily. May take with food to minimize GI side effects. What changed: additional instructions   hydrOXYzine 10 MG tablet Commonly known as: ATARAX/VISTARIL Take 1 tablet (10 mg total) by mouth 3 (three) times daily as needed for itching.   oxyCODONE-acetaminophen 10-325 MG tablet Commonly known as: Percocet Take 1 tablet by mouth every 6 (six) hours as needed for up to 15 days for pain.   OxyCONTIN 20 mg 12 hr tablet Generic drug: oxyCODONE Take 1 tablet (20 mg total) by mouth every 12 (twelve) hours.   rivaroxaban 20 MG Tabs  tablet Commonly known as: XARELTO Take 1 tablet (20 mg total) by mouth daily with supper.        Consults:  None  Significant Diagnostic Studies:  No results found.  History of present illness: Timothy Marks is a 30 year old male with a medical history significant for sickle cell disease, chronic pain syndrome, opiate dependence and tolerance, and history of anemia of chronic disease that presents with complaints of low back and lower extremity pain over the past 24 hours that is consistent with his typical pain crisis.  Patient states that he awakened on yesterday morning with increased pain to low back characterized as constant and throbbing.  He stated that he has been taking medications consistently for this problem without any relief.  He last had Percocet around 7:00 this a.m.  Currently, pain intensity is 8/10.  Patient denies any subjective fevers, chills, headache, chest pain, urinary symptoms, nausea, vomiting, or diarrhea.  He has had no sick contacts, recent travel, or.  Patient has not been vaccinated for COVID-19. [facility] Course: Patient admitted to sickle cell infusion clinic for management of pain crisis.  Reviewed all laboratory values.  Largely consistent with patient's baseline. Pain managed with IV Dilaudid PCA with only 0.5 mg, 10-minute lockout, and 3 mg/h. IV Toradol 15 mg multiple Tylenol 1000 mg IV fluids, 0.45% saline at 100 mL/h. Hemoglobin decreased to 3.9.  Patient is requesting discharge home.  You for the appointment scheduled today.  Advised to follow-up with PCP for medication management Patient is alert, oriented, and ambulating without improvement. He was discharged home in a hemodynamically stable condition.  Discharge instructions: Resume all home medications.   Follow up with PCP as previously  scheduled.   Discussed the importance of drinking 64 ounces of water daily, dehydration of red blood cells may lead further sickling.    Avoid all stressors that precipitate sickle cell pain crisis.     The patient was given clear instructions to go to ER or return to medical center if symptoms do not improve, worsen or new problems develop.     Physical Exam at Discharge:  BP 125/62 (BP Location: Right Arm)   Pulse 76   Temp 97.8 F (36.6 C) (Oral)   Resp 10   SpO2 95%  Physical Exam Constitutional:      Appearance: Normal appearance.  HENT:     Mouth/Throat:     Mouth: Mucous membranes are moist.  Eyes:     Pupils: Pupils are equal, round, and reactive to light.  Cardiovascular:     Rate and Rhythm: Normal rate and regular rhythm.     Pulses: Normal pulses.  Pulmonary:     Effort: Pulmonary effort is normal.  Abdominal:     General: Abdomen is flat. Bowel sounds are normal.  Neurological:     General: No focal deficit present.     Mental Status: He is alert. Mental status is at baseline.  Psychiatric:        Mood and Affect: Mood normal.        Thought Content: Thought content normal.        Judgment: Judgment normal.       Disposition at Discharge: Discharge disposition: 01-Home or Self Care       Discharge Orders: Discharge Instructions    Discharge patient   Complete by: As directed    Discharge disposition: 01-Home or Self Care   Discharge patient date: 03/12/2020      Condition at Discharge:   Stable  Time spent on Discharge:  Greater than 30 minutes.  Signed: [name]  APRN, MSN, FNP-C [facility] [address] [place], [place] [address] [phone]  03/12/2020, 1:24 PM

## 2020-03-12 NOTE — H&P (Signed)
[facility] History and Physical   Date: 03/12/2020  Patient name: Timothy Marks Medical record number: [id number] Date of birth: 1990-12-29 Age: 30 y.o. Gender: male PCP: Tresa Garter, MD  Attending physician: Tresa Garter, MD  Chief Complaint: Sickle cell pain  History of Present Illness: Timothy Marks is a 30 year old male with a medical history significant for sickle cell disease, chronic pain syndrome, opiate dependence and tolerance, and history of anemia of chronic disease that presents with complaints of low back and lower extremity pain over the past 24 hours that is consistent with his typical pain crisis.  Patient states that he awakened on yesterday morning with increased pain to low back characterized as constant and throbbing.  He stated that he has been taking medications consistently for this problem without any relief.  He last had Percocet around 7:00 this a.m.  Currently, pain intensity is 8/10.  Patient denies any subjective fevers, chills, headache, chest pain, urinary symptoms, nausea, vomiting, or diarrhea.  He has had no sick contacts, recent travel, or.  Patient has not been vaccinated for COVID-19.  Meds: Medications Prior to Admission  Medication Sig Dispense Refill Last Dose  . cholecalciferol (VITAMIN D) 1000 units tablet Take 2 tablets (2,000 Units total) by mouth daily. 30 tablet 6   . folic acid (FOLVITE) 1 MG tablet Take 1 tablet (1 mg total) by mouth daily. 90 tablet 2   . gabapentin (NEURONTIN) 300 MG capsule Take 1 capsule (300 mg total) by mouth 3 (three) times daily. 90 capsule 5   . hydroxyurea (HYDREA) 500 MG capsule Take 1 capsule (500 mg total) by mouth 2 (two) times daily. May take with food to minimize GI side effects. (Patient taking differently: Take 500 mg by mouth 2 (two) times daily.) 180 capsule 2   . hydrOXYzine (ATARAX/VISTARIL) 10 MG tablet Take 1 tablet (10 mg total) by mouth 3 (three) times daily as needed  for itching. 30 tablet 0   . Nutritional Supplements (ENSURE ACTIVE HIGH PROTEIN) LIQD Take 1 each by mouth 3 (three) times daily between meals. #90 cans of Ensure (Patient taking differently: Take 1 each by mouth See admin instructions. 5 times daily) 237 mL 11   . oxyCODONE-acetaminophen (PERCOCET) 10-325 MG tablet Take 1 tablet by mouth every 6 (six) hours as needed for up to 15 days for pain. 60 tablet 0   . OXYCONTIN 20 MG 12 hr tablet Take 1 tablet (20 mg total) by mouth every 12 (twelve) hours. 60 tablet 0   . rivaroxaban (XARELTO) 20 MG TABS tablet Take 1 tablet (20 mg total) by mouth daily with supper. 30 tablet 0     Allergies: Buprenorphine hcl, Levaquin [levofloxacin], Meperidine, Morphine, Morphine and related, Toradol [ketorolac tromethamine], Tramadol, Vancomycin, and Zosyn [piperacillin sod-tazobactam so] Past Medical History:  Diagnosis Date  . Depression   . Generalized weakness 03/31/2019  . 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 Sickle cell anemia (HCC)   . Vitamin D deficiency y-6   Past Surgical History:  Procedure Laterality Date  . CHOLECYSTECTOMY     Family History  Problem Relation Age of Onset  . Sickle cell trait Mother   . Sickle cell trait Father    Social History   Socioeconomic History  . Marital status: Single    Spouse name: Not on file  . Number of children: Not on file  . Years of education: Not  on file  . Highest education level: Not on file  Occupational History  . Not on file  Tobacco Use  . Smoking status: Never Smoker  . Smokeless tobacco: Never Used  Vaping Use  . Vaping Use: Never used  Substance and Sexual Activity  . Alcohol use: No  . Drug use: Not Currently    Types: Marijuana  . Sexual activity: Not on file  Other Topics Concern  . Not on file  Social History Narrative  . Not on file   Social Determinants of Health   Financial Resource Strain: Not on file   Food Insecurity: Not on file  Transportation Needs: Not on file  Physical Activity: Not on file  Stress: Not on file  Social Connections: Not on file  Intimate Partner Violence: Not on file    Review of Systems  Constitutional: Negative for chills and fever.  HENT: Negative.   Eyes: Negative.   Respiratory: Negative.   Cardiovascular: Negative.   Genitourinary: Negative.   Musculoskeletal: Positive for back pain and joint pain.  Skin: Negative.   Neurological: Negative.   Psychiatric/Behavioral: Negative.     Physical Exam: There were no vitals taken for this visit. Physical Exam Constitutional:      Appearance: Normal appearance.  HENT:     Mouth/Throat:     Mouth: Mucous membranes are moist.  Cardiovascular:     Rate and Rhythm: Normal rate and regular rhythm.     Pulses: Normal pulses.     Heart sounds: Normal heart sounds.  Pulmonary:     Effort: Pulmonary effort is normal.  Abdominal:     General: Abdomen is flat. Bowel sounds are normal.  Skin:    General: Skin is warm.  Neurological:     General: No focal deficit present.     Mental Status: He is alert. Mental status is at baseline.      Lab results: No results found for this or any previous visit (from the past 24 hour(s)).  Imaging results:  No results found.   Assessment & Plan: Patient admitted to sickle cell day infusion center for management of pain crisis.  Patient is opiate tolerant Initiate IV dilaudid PCA. Settings of 0.5 mg, 10 minute lockout, and 3 mg/hr IV fluids, 0.45% saline at 100 ml/hr Toradol 15 mg IV times one dose Tylenol 1000 mg by mouth times one dose Review CBC with differential, complete metabolic panel, and reticulocytes as results become available. Pain intensity will be reevaluated in context of functioning and relationship to baseline as care progresses If pain intensity remains elevated and/or sudden change in hemodynamic stability transition to inpatient services for  higher level of care.    Timothy Pounds  APRN, MSN, FNP-C [facility] [address] [place], [place] [address] [phone]  03/12/2020, 8:49 AM

## 2020-03-12 NOTE — Discharge Instructions (Signed)
Sickle Cell Anemia, Adult  Sickle cell anemia is a condition where your red blood cells are shaped like sickles. Red blood cells carry oxygen through the body. Sickle-shaped cells do not live as long as normal red blood cells. They also clump together and block blood from flowing through the blood vessels. This prevents the body from getting enough oxygen. Sickle cell anemia causes organ damage and pain. It also increases the risk of infection. Follow these instructions at home: Medicines  Take over-the-counter and prescription medicines only as told by your doctor.  If you were prescribed an antibiotic medicine, take it as told by your doctor. Do not stop taking the antibiotic even if you start to feel better.  If you develop a fever, do not take medicines to lower the fever right away. Tell your doctor about the fever. Managing pain, stiffness, and swelling  Try these methods to help with pain: ? Use a heating pad. ? Take a warm bath. ? Distract yourself, such as by watching TV. Eating and drinking  Drink enough fluid to keep your pee (urine) clear or pale yellow. Drink more in hot weather and during exercise.  Limit or avoid alcohol.  Eat a healthy diet. Eat plenty of fruits, vegetables, whole grains, and lean protein.  Take vitamins and supplements as told by your doctor. Traveling  When traveling, keep these with you: ? Your medical information. ? The names of your doctors. ? Your medicines.  If you need to take an airplane, talk to your doctor first. Activity  Rest often.  Avoid exercises that make your heart beat much faster, such as jogging. General instructions  Do not use products that have nicotine or tobacco, such as cigarettes and e-cigarettes. If you need help quitting, ask your doctor.  Consider wearing a medical alert bracelet.  Avoid being in high places (high altitudes), such as mountains.  Avoid very hot or cold temperatures.  Avoid places where the  temperature changes a lot.  Keep all follow-up visits as told by your doctor. This is important. Contact a doctor if:  A joint hurts.  Your feet or hands hurt or swell.  You feel tired (fatigued). Get help right away if:  You have symptoms of infection. These include: ? Fever. ? Chills. ? Being very tired. ? Irritability. ? Poor eating. ? Throwing up (vomiting).  You feel dizzy or faint.  You have new stomach pain, especially on the left side.  You have a an erection (priapism) that lasts more than 4 hours.  You have numbness in your arms or legs.  You have a hard time moving your arms or legs.  You have trouble talking.  You have pain that does not go away when you take medicine.  You are short of breath.  You are breathing fast.  You have a long-term cough.  You have pain in your chest.  You have a bad headache.  You have a stiff neck.  Your stomach looks bloated even though you did not eat much.  Your skin is pale.  You suddenly cannot see well. Summary  Sickle cell anemia is a condition where your red blood cells are shaped like sickles.  Follow your doctor's advice on ways to manage pain, food to eat, activities to do, and steps to take for safe travel.  Get medical help right away if you have any signs of infection, such as a fever. This information is not intended to replace advice given to you by   your health care provider. Make sure you discuss any questions you have with your health care provider. Document Revised: 06/27/2019 Document Reviewed: 06/27/2019 Elsevier Patient Education  2021 [employer]  

## 2020-03-12 NOTE — Telephone Encounter (Signed)
Patient called in. Complains of  generalized pain  rates 9/10. Denied chest pain, abd pain, fever, N/V/D, or priapism. Wants to come in for treatment. Last took Percocet 10/325mg  at 5:30am. Denies any recent ED visits. Denies any exposure to anyone who is covid positive in the last two weeks. States he has not undergone testing for covid in last two weeks and denies any flu like symptoms today. Pt states his girlfriend is his transport today. [place] FNP made aware, approved for patient to be seen in the day hospital. Pt made aware, verbalized understanding.

## 2020-03-12 NOTE — Progress Notes (Signed)
Pt admitted to the day hospital for treatment of sickle cell pain crisis. Pt reported generalized pain rated a 9/10. Pt given PO Benadryl and Tylenol,placed on Dilaudid PCA, and hydrated with IV fluids. Pt given IV zofran for c/o nausea. At discharge patient reported pain a  6/10. Says he is feeling more comfortable but requests to be discharged as he has a dentist appointment he needs to go to this afternoon. Pt alert , oriented and ambulatory at discharge.

## 2020-03-13 ENCOUNTER — Emergency Department (HOSPITAL_COMMUNITY): Payer: Medicare Other

## 2020-03-13 ENCOUNTER — Inpatient Hospital Stay (HOSPITAL_COMMUNITY)
Admission: EM | Admit: 2020-03-13 | Discharge: 2020-03-19 | DRG: 812 | Disposition: A | Discharge status: Home / Self Care | Payer: Medicare Other | Attending: Internal Medicine | Admitting: Internal Medicine

## 2020-03-13 ENCOUNTER — Telehealth (HOSPITAL_COMMUNITY): Payer: Self-pay | Admitting: *Deleted

## 2020-03-13 ENCOUNTER — Telehealth: Payer: Self-pay | Admitting: Internal Medicine

## 2020-03-13 ENCOUNTER — Other Ambulatory Visit: Payer: Self-pay | Admitting: Internal Medicine

## 2020-03-13 ENCOUNTER — Other Ambulatory Visit: Payer: Self-pay

## 2020-03-13 DIAGNOSIS — Z885 Allergy status to narcotic agent status: Secondary | ICD-10-CM

## 2020-03-13 DIAGNOSIS — E876 Hypokalemia: Secondary | ICD-10-CM | POA: Diagnosis present

## 2020-03-13 DIAGNOSIS — J9811 Atelectasis: Secondary | ICD-10-CM | POA: Diagnosis present

## 2020-03-13 DIAGNOSIS — G894 Chronic pain syndrome: Secondary | ICD-10-CM | POA: Diagnosis present

## 2020-03-13 DIAGNOSIS — Z881 Allergy status to other antibiotic agents status: Secondary | ICD-10-CM

## 2020-03-13 DIAGNOSIS — G8929 Other chronic pain: Secondary | ICD-10-CM | POA: Diagnosis present

## 2020-03-13 DIAGNOSIS — E871 Hypo-osmolality and hyponatremia: Secondary | ICD-10-CM | POA: Diagnosis present

## 2020-03-13 DIAGNOSIS — R509 Fever, unspecified: Secondary | ICD-10-CM

## 2020-03-13 DIAGNOSIS — R440 Auditory hallucinations: Secondary | ICD-10-CM | POA: Diagnosis not present

## 2020-03-13 DIAGNOSIS — Z888 Allergy status to other drugs, medicaments and biological substances status: Secondary | ICD-10-CM

## 2020-03-13 DIAGNOSIS — R441 Visual hallucinations: Secondary | ICD-10-CM | POA: Diagnosis not present

## 2020-03-13 DIAGNOSIS — Z832 Family history of diseases of the blood and blood-forming organs and certain disorders involving the immune mechanism: Secondary | ICD-10-CM

## 2020-03-13 DIAGNOSIS — D5700 Hb-SS disease with crisis, unspecified: Principal | ICD-10-CM | POA: Diagnosis present

## 2020-03-13 DIAGNOSIS — Z86718 Personal history of other venous thrombosis and embolism: Secondary | ICD-10-CM

## 2020-03-13 DIAGNOSIS — Z7901 Long term (current) use of anticoagulants: Secondary | ICD-10-CM

## 2020-03-13 DIAGNOSIS — D72829 Elevated white blood cell count, unspecified: Secondary | ICD-10-CM | POA: Diagnosis present

## 2020-03-13 DIAGNOSIS — F1120 Opioid dependence, uncomplicated: Secondary | ICD-10-CM | POA: Diagnosis present

## 2020-03-13 DIAGNOSIS — Z8616 Personal history of COVID-19: Secondary | ICD-10-CM

## 2020-03-13 DIAGNOSIS — D638 Anemia in other chronic diseases classified elsewhere: Secondary | ICD-10-CM | POA: Diagnosis present

## 2020-03-13 DIAGNOSIS — R748 Abnormal levels of other serum enzymes: Secondary | ICD-10-CM | POA: Diagnosis present

## 2020-03-13 DIAGNOSIS — R4689 Other symptoms and signs involving appearance and behavior: Secondary | ICD-10-CM | POA: Diagnosis present

## 2020-03-13 LAB — CBC WITH DIFFERENTIAL/PLATELET
Abs Immature Granulocytes: 0.22 10*3/uL — ABNORMAL HIGH (ref 0.00–0.07)
Basophils Absolute: 0 10*3/uL (ref 0.0–0.1)
Basophils Relative: 0 %
Eosinophils Absolute: 0 10*3/uL (ref 0.0–0.5)
Eosinophils Relative: 0 %
HCT: 23.8 % — ABNORMAL LOW (ref 39.0–52.0)
Hemoglobin: 8.3 g/dL — ABNORMAL LOW (ref 13.0–17.0)
Immature Granulocytes: 1 %
Lymphocytes Relative: 11 %
Lymphs Abs: 2 10*3/uL (ref 0.7–4.0)
MCH: 30.5 pg (ref 26.0–34.0)
MCHC: 34.9 g/dL (ref 30.0–36.0)
MCV: 87.5 fL (ref 80.0–100.0)
Monocytes Absolute: 1.4 10*3/uL — ABNORMAL HIGH (ref 0.1–1.0)
Monocytes Relative: 8 %
Neutro Abs: 15.1 10*3/uL — ABNORMAL HIGH (ref 1.7–7.7)
Neutrophils Relative %: 80 %
Platelets: 294 10*3/uL (ref 150–400)
RBC: 2.72 MIL/uL — ABNORMAL LOW (ref 4.22–5.81)
RDW: 20.6 % — ABNORMAL HIGH (ref 11.5–15.5)
WBC: 18.8 10*3/uL — ABNORMAL HIGH (ref 4.0–10.5)
nRBC: 4.9 % — ABNORMAL HIGH (ref 0.0–0.2)

## 2020-03-13 LAB — COMPREHENSIVE METABOLIC PANEL
ALT: 27 U/L (ref 0–44)
AST: 53 U/L — ABNORMAL HIGH (ref 15–41)
Albumin: 3.9 g/dL (ref 3.5–5.0)
Alkaline Phosphatase: 140 U/L — ABNORMAL HIGH (ref 38–126)
Anion gap: 11 (ref 5–15)
BUN: 11 mg/dL (ref 6–20)
CO2: 23 mmol/L (ref 22–32)
Calcium: 9.1 mg/dL (ref 8.9–10.3)
Chloride: 102 mmol/L (ref 98–111)
Creatinine, Ser: 0.84 mg/dL (ref 0.61–1.24)
GFR, Estimated: >60 mL/min (ref >60)
Glucose, Bld: 136 mg/dL — ABNORMAL HIGH (ref 70–99)
Potassium: 3.7 mmol/L (ref 3.5–5.1)
Sodium: 136 mmol/L (ref 135–145)
Total Bilirubin: 6.7 mg/dL — ABNORMAL HIGH (ref 0.3–1.2)
Total Protein: 7.4 g/dL (ref 6.5–8.1)

## 2020-03-13 LAB — RETICULOCYTES
Immature Retic Fract: 32.9 % — ABNORMAL HIGH (ref 2.3–15.9)
RBC.: 2.64 MIL/uL — ABNORMAL LOW (ref 4.22–5.81)
Retic Count, Absolute: 375 10*3/uL — ABNORMAL HIGH (ref 19.0–186.0)
Retic Ct Pct: 13.4 % — ABNORMAL HIGH (ref 0.4–3.1)

## 2020-03-13 MED ORDER — DIPHENHYDRAMINE HCL 25 MG PO CAPS
25.0000 mg | ORAL_CAPSULE | ORAL | Status: DC | PRN
  Filled 2020-03-13: qty 1

## 2020-03-13 MED ORDER — HYDROMORPHONE HCL 1 MG/ML IJ SOLN
2.0000 mg | INTRAMUSCULAR | Status: AC
  Administered 2020-03-14: 2 mg via INTRAVENOUS
  Filled 2020-03-13: qty 2

## 2020-03-13 MED ORDER — ONDANSETRON HCL 4 MG/2ML IJ SOLN
4.0000 mg | INTRAMUSCULAR | Status: DC | PRN
  Administered 2020-03-14: 4 mg via INTRAVENOUS
  Filled 2020-03-13: qty 2

## 2020-03-13 NOTE — Telephone Encounter (Signed)
Patient called back to the day hospital requesting to come for treatment. Patient reports that he can "hardly walk" due to pain. Patient also requesting to talk to the medical director for the Patient Timothy Marks. Provider, [place], [place], notified of patient's request. Since patient is reporting severe immobilizing pain, provider advises that patient go to the ER for treatment. Provider would be able to see patient in the ER and admit patent if necessary. Patient advised.

## 2020-03-13 NOTE — Telephone Encounter (Signed)
Patient called requesting to come to the day hospital for sickle cell pain. Patient reports generalized pain rated 10/10. Reports taking Oxycontin 20 mg and Oxycodone at 5:00 am. Patient was treated in the day hospital yesterday for sickle cell pain. COVID-19 screening done and patient denies all symptoms and exposures. Denies fever, chest pain, nausea, vomiting, diarrhea, abdominal pain and priapism. [place], [place] notified and advised that patient continues to take prescribed pain medications around the clock, hydrate with 64 ounces of water and alternate 800 mg Ibuprofen q8 with 1000 mg Tylenol q6. Patient advised and expresses an understanding.

## 2020-03-13 NOTE — ED Triage Notes (Signed)
Pt presents to ED BIB [facility]. Pt c/o sickle pain crisis. That began yesterday.  20mg  oxycotin @ 1800 sbp 140 palp HR - 150

## 2020-03-13 NOTE — ED Provider Notes (Signed)
[facility] EMERGENCY DEPARTMENT Provider Note   CSN: [id number] Arrival date & time: 03/13/20  2139     History Chief Complaint  Patient presents with  . Sickle Cell Pain Crisis    Timothy Marks is a 30 y.o. male.  The history is provided by the patient.  Sickle Cell Pain Crisis Location:  Diffuse Severity:  Severe Onset quality:  Gradual Similar to previous crisis episodes: yes   Timing:  Constant Progression:  Worsening Chronicity:  New Relieved by:  Nothing Worsened by:  Movement and activity Associated symptoms: no chest pain, no cough, no fever, no shortness of breath and no vomiting   Patient with history of sickle cell disease presents with pain.  He reports he is having pain in his low back as well as bilateral arms and legs.  He reports this is similar to prior pain crisis.  Denies any chest pain or shortness of breath.  No fevers or vomiting.  He reports intense pain in all of his joints. He reports his pain was not controlled with home medications     Past Medical History:  Diagnosis Date  . Depression   . Generalized weakness 03/31/2019  . 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 Sickle cell anemia (HCC)   . Vitamin D deficiency y-6    Patient Active Problem List   Diagnosis Date Noted  . Lab test positive for detection of COVID-19 virus 01/23/2020  . Medication management 01/16/2020  . Underweight 06/14/2019  . Generalized weakness 03/31/2019  . Sickle-cell crisis ([place]) 03/24/2019  . SIRS (systemic inflammatory response syndrome) ([place]) 02/10/2019  . Abnormal liver function 11/04/2018  . Sickle cell anemia with crisis ([place]) 05/22/2018  . Narcotic dependence ([place]) 02/10/2018  . Acute bilateral low back pain without sciatica   . Other chronic pain   . Anemia   . Socially inappropriate behavior 12/02/2017  . Thrombocythemia   . Physical deconditioning   . Agitation   .  Chronic pain syndrome   . Recurrent cold sores   . Fever   . Drug-seeking behavior 08/30/2017  . Pain   . Sickle cell crisis ([place]) 07/16/2017  . Adjustment disorder with mixed disturbance of emotions and conduct   . Bilateral pulmonary infiltrates on chest x-ray 11/21/2016  . History of DVT (deep vein thrombosis) 11/17/2016  . Suicidal ideations 10/11/2016  . Reticulocytosis 10/09/2016  . DVT (deep venous thrombosis) ([place]) 08/02/2016  . Has difficulty accessing primary care provider for most visits 07/11/2015  . 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 Hordeolum externum of left upper eyelid 05/31/2015  . Major depressive disorder with current active episode 11/09/2014  . Hb-SS disease with acute chest syndrome ([name]) 11/08/2014  . Pneumonia of right upper lobe due to infectious organism 11/07/2014  . Constipation 08/03/2014  . Leukocytosis 06/30/2014  . 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 Protein-calorie malnutrition, severe ([place]) 04/09/2014  . Major depressive disorder, recurrent, severe without psychotic features ([place])   . 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 Vitamin D deficiency   . Sickle cell anemia ([place]) 03/30/2014  . Hyperbilirubinemia 03/30/2014  . Hypokalemia 02/07/2014  . Mandible fracture ([place]) 05/03/2013  . Aggressive behavior 01/25/2013  . 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 Sickle cell anemia with pain ([name]) 01/24/2013  . Sickle cell pain crisis ([place]) 01/24/2013  . 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 Other hemoglobinopathies ([place]) 04/22/2011  . 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 CHOLECYSTECTOMY         Family History  Problem Relation Age of Onset  . Sickle cell trait Mother   . Sickle cell trait Father     Social History   Tobacco Use  . Smoking status: Never Smoker  . Smokeless tobacco:  Never Used  Vaping Use  . Vaping Use: Never used  Substance Use Topics  . Alcohol use: No  . Drug use: Not Currently    Types: Marijuana    Home Medications Prior to Admission medications   Medication Sig Start Date End Date Taking? Authorizing Provider  cholecalciferol (VITAMIN D) 1000 units tablet Take 2 tablets (2,000 Units total) by mouth daily. 12/13/17   Azzie Glatter, FNP  folic acid (FOLVITE) 1 MG tablet Take 1 tablet (1 mg total) by mouth daily. 05/01/17   Dorena Dew, FNP  gabapentin (NEURONTIN) 300 MG capsule Take 1 capsule (300 mg total) by mouth 3 (three) times daily. 03/12/19   Dorena Dew, FNP  hydroxyurea (HYDREA) 500 MG capsule Take 1 capsule (500 mg total) by mouth 2 (two) times daily. May take with food to minimize GI side effects. Patient taking differently: Take 500 mg by mouth 2 (two) times daily. 05/01/17   Dorena Dew, FNP  hydrOXYzine (ATARAX/VISTARIL) 10 MG tablet Take 1 tablet (10 mg total) by mouth 3 (three) times daily as needed for itching. 01/16/20   Tresa Garter, MD  Nutritional Supplements (ENSURE ACTIVE HIGH PROTEIN) LIQD Take 1 each by mouth 3 (three) times daily between meals. #90 cans of Ensure Patient taking differently: Take 1 each by mouth See admin instructions. 5 times daily 11/13/18   Dorena Dew, FNP  oxyCODONE-acetaminophen (PERCOCET) 10-325 MG tablet Take 1 tablet by mouth every 6 (six) hours as needed for up to 15 days for pain. 03/08/20 03/23/20  Tresa Garter, MD  OXYCONTIN 20 MG 12 hr tablet Take 1 tablet (20 mg total) by mouth every 12 (twelve) hours. 02/26/20 03/27/20  Tresa Garter, MD  rivaroxaban (XARELTO) 20 MG TABS tablet Take 1 tablet (20 mg total) by mouth daily with supper. 03/11/20 04/10/20  Tresa Garter, MD    Allergies    Buprenorphine hcl, Levaquin [levofloxacin], Meperidine, Morphine, Morphine and related, Toradol [ketorolac tromethamine], Tramadol, Vancomycin, and Zosyn  [piperacillin sod-tazobactam so]  Review of Systems   Review of Systems  Constitutional: Negative for fever.  Respiratory: Negative for cough and shortness of breath.   Cardiovascular: Negative for chest pain.  Gastrointestinal: Negative for diarrhea and vomiting.  Genitourinary: Negative for dysuria.  Musculoskeletal: Positive for arthralgias. Negative for joint swelling.  All other systems reviewed and are negative.   Physical Exam Updated Vital Signs BP 131/65 (BP Location: Left Arm)   Pulse (!) 116   Temp 99.7 F (37.6 C) (Oral)   Resp 19   SpO2 98%   Physical Exam CONSTITUTIONAL: Thin and ill-appearing HEAD: Normocephalic/atraumatic EYES: EOMI ENMT: Mucous membranes moist NECK: supple no meningeal signs SPINE/BACK:entire spine nontender, No bruising/crepitance/stepoffs noted to spine CV: S1/S2 noted, no murmurs/rubs/gallops noted, tachycardic LUNGS: Lungs are clear to auscultation bilaterally, no apparent distress ABDOMEN: soft, nontender, no rebound or guarding, bowel sounds noted throughout abdomen [id number] cva tenderness NEURO: Pt is awake/alert/appropriate, moves all extremitiesx4.  No facial droop.  No focal arm or leg weakness EXTREMITIES: pulses normal/equal,  full ROM, diffuse tenderness noted throughout all 4 extremities.  Distal pulses equal and intact.  There are no joint effusions noted.  No joint erythema is noted SKIN: warm, color normal PSYCH: Anxious  ED Results / Procedures / Treatments   Labs (all labs ordered are listed, but only abnormal results are displayed) Labs Reviewed  COMPREHENSIVE METABOLIC PANEL - Abnormal; Notable for the following components:      Result Value   Glucose, Bld 136 (*)    AST 53 (*)    Alkaline Phosphatase 140 (*)    Total Bilirubin 6.7 (*)    All other components within normal limits  CBC WITH DIFFERENTIAL/PLATELET - Abnormal; Notable for the following components:   WBC 18.8 (*)    RBC 2.72 (*)    Hemoglobin 8.3  (*)    HCT 23.8 (*)    RDW 20.6 (*)    nRBC 4.9 (*)    Neutro Abs 15.1 (*)    Monocytes Absolute 1.4 (*)    Abs Immature Granulocytes 0.22 (*)    All other components within normal limits  RETICULOCYTES - Abnormal; Notable for the following components:   Retic Ct Pct 13.4 (*)    RBC. 2.64 (*)    Retic Count, Absolute 375.0 (*)    Immature Retic Fract 32.9 (*)    All other components within normal limits  URINALYSIS, ROUTINE W REFLEX MICROSCOPIC - Abnormal; Notable for the following components:   Color, Urine AMBER (*)    APPearance HAZY (*)    Hgb urine dipstick MODERATE (*)    Protein, ur 30 (*)    Leukocytes,Ua SMALL (*)    Bacteria, UA RARE (*)    All other components within normal limits  SARS CORONAVIRUS 2 (TAT 6-24 HRS)  COMPREHENSIVE METABOLIC PANEL    EKG None  Radiology DG Chest Portable 1 View  Result Date: 03/13/2020 CLINICAL DATA:  Sickle cell pain EXAM: PORTABLE CHEST 1 VIEW COMPARISON:  12/19/2019 FINDINGS: Linear densities in the lung bases, likely atelectasis. Heart is normal size. No effusions. No acute bony abnormality. IMPRESSION: Bibasilar atelectasis.  No acute cardiopulmonary disease. Electronically Signed   By: Rolm Baptise M.D.   On: 03/13/2020 22:17    Procedures Procedures   Medications Ordered in ED Medications  diphenhydrAMINE (BENADRYL) capsule 25-50 mg (has no administration in time range)  ondansetron (ZOFRAN) injection 4 mg (4 mg Intravenous Given 03/14/20 0000)  HYDROmorphone (DILAUDID) injection 2 mg (has no administration in time range)  cholecalciferol (VITAMIN D) tablet 2,000 Units (has no administration in time range)  folic acid (FOLVITE) tablet 1 mg (has no administration in time range)  gabapentin (NEURONTIN) capsule 300 mg (has no administration in time range)  hydroxyurea (HYDREA) capsule 500 mg (has no administration in time range)  Ensure Active High Protein LIQD 1 each (has no administration in time range)   oxyCODONE-acetaminophen (PERCOCET) 10-325 MG per tablet 1 tablet (has no administration in time range)  oxyCODONE (OXYCONTIN) 12 hr tablet 20 mg (has no administration in time range)  rivaroxaban (XARELTO) tablet 20 mg (has no administration in time range)  0.9 %  sodium chloride infusion (has no administration in time range)  HYDROmorphone (DILAUDID) injection 2 mg (2 mg Intravenous Given 03/14/20 0004)  HYDROmorphone (DILAUDID) injection 2 mg (2 mg Intravenous Given 03/14/20 0039)    ED Course  I have reviewed the triage vital signs and the nursing notes.  Pertinent labs  results that were available during my care of the  patient were reviewed by me and considered in my medical decision making (see chart for details).    MDM Rules/Calculators/A&P                          11:30 PM Patient presents with pain crisis.  Patient is very uncomfortable appearing IV narcotics have been ordered for pain control.  We will follow closely.  Labs reveal chronic anemia.  Chest x-ray was reviewed and do not reveal any acute findings 3:32 AM Patient given multiple rounds of pain medications without improvement.  Patient reports continued pain.  He will be admitted for pain crisis.  Patient is afebrile, denies any chest pain or shortness of breath.  Chest x-ray was reviewed and is negative Discussed with Dr. Nevada Crane for admission Final Clinical Impression(s) / ED Diagnoses Final diagnoses:  Sickle cell pain crisis [facility])    Rx / DC Orders ED Discharge Orders    None       Ripley Fraise, MD 03/14/20 [phone]

## 2020-03-13 NOTE — ED Notes (Signed)
Consulted with Dr. Sabra Heck on pt's pain d/t so many allergies. Dr. Sabra Heck advised to wait until pt is in room

## 2020-03-13 NOTE — Telephone Encounter (Signed)
Not due for refill until 03/23/2020

## 2020-03-14 ENCOUNTER — Other Ambulatory Visit: Payer: Self-pay

## 2020-03-14 ENCOUNTER — Encounter (HOSPITAL_COMMUNITY): Payer: Self-pay | Admitting: Family Medicine

## 2020-03-14 DIAGNOSIS — Z832 Family history of diseases of the blood and blood-forming organs and certain disorders involving the immune mechanism: Secondary | ICD-10-CM | POA: Diagnosis not present

## 2020-03-14 DIAGNOSIS — F1120 Opioid dependence, uncomplicated: Secondary | ICD-10-CM | POA: Diagnosis present

## 2020-03-14 DIAGNOSIS — E876 Hypokalemia: Secondary | ICD-10-CM | POA: Diagnosis present

## 2020-03-14 DIAGNOSIS — Z888 Allergy status to other drugs, medicaments and biological substances status: Secondary | ICD-10-CM | POA: Diagnosis not present

## 2020-03-14 DIAGNOSIS — R440 Auditory hallucinations: Secondary | ICD-10-CM | POA: Diagnosis not present

## 2020-03-14 DIAGNOSIS — R4689 Other symptoms and signs involving appearance and behavior: Secondary | ICD-10-CM | POA: Diagnosis present

## 2020-03-14 DIAGNOSIS — Z7901 Long term (current) use of anticoagulants: Secondary | ICD-10-CM | POA: Diagnosis not present

## 2020-03-14 DIAGNOSIS — E871 Hypo-osmolality and hyponatremia: Secondary | ICD-10-CM | POA: Diagnosis present

## 2020-03-14 DIAGNOSIS — R748 Abnormal levels of other serum enzymes: Secondary | ICD-10-CM | POA: Diagnosis present

## 2020-03-14 DIAGNOSIS — D638 Anemia in other chronic diseases classified elsewhere: Secondary | ICD-10-CM | POA: Diagnosis present

## 2020-03-14 DIAGNOSIS — J9811 Atelectasis: Secondary | ICD-10-CM | POA: Diagnosis present

## 2020-03-14 DIAGNOSIS — D72829 Elevated white blood cell count, unspecified: Secondary | ICD-10-CM | POA: Diagnosis present

## 2020-03-14 DIAGNOSIS — Z881 Allergy status to other antibiotic agents status: Secondary | ICD-10-CM | POA: Diagnosis not present

## 2020-03-14 DIAGNOSIS — Z86718 Personal history of other venous thrombosis and embolism: Secondary | ICD-10-CM | POA: Diagnosis not present

## 2020-03-14 DIAGNOSIS — Z8616 Personal history of COVID-19: Secondary | ICD-10-CM | POA: Diagnosis not present

## 2020-03-14 DIAGNOSIS — R441 Visual hallucinations: Secondary | ICD-10-CM | POA: Diagnosis not present

## 2020-03-14 DIAGNOSIS — G894 Chronic pain syndrome: Secondary | ICD-10-CM | POA: Diagnosis present

## 2020-03-14 DIAGNOSIS — R509 Fever, unspecified: Secondary | ICD-10-CM | POA: Diagnosis not present

## 2020-03-14 DIAGNOSIS — Z885 Allergy status to narcotic agent status: Secondary | ICD-10-CM | POA: Diagnosis not present

## 2020-03-14 DIAGNOSIS — D5700 Hb-SS disease with crisis, unspecified: Secondary | ICD-10-CM | POA: Diagnosis present

## 2020-03-14 LAB — COMPREHENSIVE METABOLIC PANEL
ALT: 24 U/L (ref 0–44)
AST: 49 U/L — ABNORMAL HIGH (ref 15–41)
Albumin: 3.6 g/dL (ref 3.5–5.0)
Alkaline Phosphatase: 136 U/L — ABNORMAL HIGH (ref 38–126)
Anion gap: 11 (ref 5–15)
BUN: 10 mg/dL (ref 6–20)
CO2: 21 mmol/L — ABNORMAL LOW (ref 22–32)
Calcium: 8.5 mg/dL — ABNORMAL LOW (ref 8.9–10.3)
Chloride: 100 mmol/L (ref 98–111)
Creatinine, Ser: 0.74 mg/dL (ref 0.61–1.24)
GFR, Estimated: >60 mL/min (ref >60)
Glucose, Bld: 108 mg/dL — ABNORMAL HIGH (ref 70–99)
Potassium: 3.7 mmol/L (ref 3.5–5.1)
Sodium: 132 mmol/L — ABNORMAL LOW (ref 135–145)
Total Bilirubin: 6.6 mg/dL — ABNORMAL HIGH (ref 0.3–1.2)
Total Protein: 6.8 g/dL (ref 6.5–8.1)

## 2020-03-14 LAB — CBC WITH DIFFERENTIAL/PLATELET
Abs Immature Granulocytes: 0.21 10*3/uL — ABNORMAL HIGH (ref 0.00–0.07)
Basophils Absolute: 0 10*3/uL (ref 0.0–0.1)
Basophils Relative: 0 %
Eosinophils Absolute: 0 10*3/uL (ref 0.0–0.5)
Eosinophils Relative: 0 %
HCT: 20.5 % — ABNORMAL LOW (ref 39.0–52.0)
Hemoglobin: 7.6 g/dL — ABNORMAL LOW (ref 13.0–17.0)
Immature Granulocytes: 1 %
Lymphocytes Relative: 17 %
Lymphs Abs: 3.6 10*3/uL (ref 0.7–4.0)
MCH: 31.8 pg (ref 26.0–34.0)
MCHC: 37.1 g/dL — ABNORMAL HIGH (ref 30.0–36.0)
MCV: 85.8 fL (ref 80.0–100.0)
Monocytes Absolute: 1.9 10*3/uL — ABNORMAL HIGH (ref 0.1–1.0)
Monocytes Relative: 9 %
Neutro Abs: 14.9 10*3/uL — ABNORMAL HIGH (ref 1.7–7.7)
Neutrophils Relative %: 73 %
Platelets: 289 10*3/uL (ref 150–400)
RBC: 2.39 MIL/uL — ABNORMAL LOW (ref 4.22–5.81)
RDW: 21.1 % — ABNORMAL HIGH (ref 11.5–15.5)
WBC: 20.7 10*3/uL — ABNORMAL HIGH (ref 4.0–10.5)
nRBC: 4.1 % — ABNORMAL HIGH (ref 0.0–0.2)

## 2020-03-14 LAB — URINALYSIS, ROUTINE W REFLEX MICROSCOPIC
Bilirubin Urine: NEGATIVE
Glucose, UA: NEGATIVE mg/dL
Ketones, ur: NEGATIVE mg/dL
Nitrite: NEGATIVE
Protein, ur: 30 mg/dL — AB
Specific Gravity, Urine: 1.011 (ref 1.005–1.030)
pH: 5 (ref 5.0–8.0)

## 2020-03-14 LAB — SARS CORONAVIRUS 2 (TAT 6-24 HRS): SARS Coronavirus 2: NEGATIVE

## 2020-03-14 MED ORDER — HYDROMORPHONE HCL 1 MG/ML IJ SOLN
INTRAMUSCULAR | Status: AC
  Filled 2020-03-14: qty 1

## 2020-03-14 MED ORDER — OXYCODONE HCL 5 MG PO TABS
5.0000 mg | ORAL_TABLET | Freq: Four times a day (QID) | ORAL | Status: DC | PRN
  Administered 2020-03-15 – 2020-03-17 (×4): 5 mg via ORAL
  Filled 2020-03-14 (×5): qty 1

## 2020-03-14 MED ORDER — SENNOSIDES-DOCUSATE SODIUM 8.6-50 MG PO TABS
2.0000 | ORAL_TABLET | Freq: Two times a day (BID) | ORAL | Status: DC
  Administered 2020-03-14 – 2020-03-19 (×11): 2 via ORAL
  Filled 2020-03-14 (×11): qty 2

## 2020-03-14 MED ORDER — FOLIC ACID 1 MG PO TABS
1.0000 mg | ORAL_TABLET | Freq: Every day | ORAL | Status: DC
  Administered 2020-03-14 – 2020-03-19 (×6): 1 mg via ORAL
  Filled 2020-03-14 (×6): qty 1

## 2020-03-14 MED ORDER — HYDROMORPHONE 1 MG/ML IV SOLN
INTRAVENOUS | Status: DC
  Administered 2020-03-14: 1.7 mg via INTRAVENOUS
  Administered 2020-03-15: 9.5 mg via INTRAVENOUS
  Administered 2020-03-15: 30 mg via INTRAVENOUS
  Administered 2020-03-15: 4.5 mg via INTRAVENOUS
  Administered 2020-03-15: 2 mg via INTRAVENOUS
  Administered 2020-03-15: 5 mg via INTRAVENOUS
  Administered 2020-03-15: 2.5 mg via INTRAVENOUS
  Administered 2020-03-16: 5 mg via INTRAVENOUS
  Administered 2020-03-16: 11.5 mg via INTRAVENOUS
  Administered 2020-03-16: 5 mg via INTRAVENOUS
  Administered 2020-03-16: 9.5 mg via INTRAVENOUS
  Administered 2020-03-16: 2.5 mg via INTRAVENOUS
  Administered 2020-03-16: 1 mg via INTRAVENOUS
  Administered 2020-03-17: 6.79 mg via INTRAVENOUS
  Administered 2020-03-17: 30 mg via INTRAVENOUS
  Administered 2020-03-17: 5.5 mg via INTRAVENOUS
  Administered 2020-03-17: 12.5 mg via INTRAVENOUS
  Administered 2020-03-17: 30 mg via INTRAVENOUS
  Administered 2020-03-17: 5 mg via INTRAVENOUS
  Administered 2020-03-18: 30 mg via INTRAVENOUS
  Administered 2020-03-18: 10 mg via INTRAVENOUS
  Administered 2020-03-18: 8 mg via INTRAVENOUS
  Administered 2020-03-18: 5 mg via INTRAVENOUS
  Administered 2020-03-18: 8.7 mg via INTRAVENOUS
  Administered 2020-03-18: 7.09 mg via INTRAVENOUS
  Filled 2020-03-14 (×6): qty 30

## 2020-03-14 MED ORDER — OXYCODONE-ACETAMINOPHEN 10-325 MG PO TABS: 1.0000 | ORAL_TABLET | Freq: Four times a day (QID) | ORAL | Status: DC | PRN

## 2020-03-14 MED ORDER — RIVAROXABAN 20 MG PO TABS
20.0000 mg | ORAL_TABLET | Freq: Every day | ORAL | Status: DC
  Administered 2020-03-15 – 2020-03-18 (×4): 20 mg via ORAL
  Filled 2020-03-14 (×5): qty 1

## 2020-03-14 MED ORDER — HYDROMORPHONE HCL 1 MG/ML IJ SOLN: 1.0000 mg | INTRAMUSCULAR | Status: DC | PRN

## 2020-03-14 MED ORDER — SODIUM CHLORIDE 0.9 % IV SOLN: INTRAVENOUS | Status: DC

## 2020-03-14 MED ORDER — SODIUM CHLORIDE 0.9% FLUSH: 9.0000 mL | INTRAVENOUS | Status: DC | PRN

## 2020-03-14 MED ORDER — NALOXONE HCL 0.4 MG/ML IJ SOLN
0.4000 mg | INTRAMUSCULAR | Status: DC | PRN
Start: 2020-03-14 — End: 2020-03-19

## 2020-03-14 MED ORDER — DEXTROSE-NACL 5-0.45 % IV SOLN: INTRAVENOUS | Status: DC

## 2020-03-14 MED ORDER — HYDROMORPHONE HCL 1 MG/ML IJ SOLN
2.0000 mg | Freq: Once | INTRAMUSCULAR | Status: AC
  Administered 2020-03-14: 1 mg via INTRAVENOUS
  Filled 2020-03-14: qty 2

## 2020-03-14 MED ORDER — SODIUM CHLORIDE 0.9 % IV SOLN
25.0000 mg | INTRAVENOUS | Status: DC | PRN
  Filled 2020-03-14: qty 0.5

## 2020-03-14 MED ORDER — ACETAMINOPHEN 325 MG PO TABS
650.0000 mg | ORAL_TABLET | Freq: Four times a day (QID) | ORAL | Status: DC | PRN
  Administered 2020-03-14 – 2020-03-16 (×5): 650 mg via ORAL
  Filled 2020-03-14 (×4): qty 2

## 2020-03-14 MED ORDER — SODIUM CHLORIDE 0.9 % IV SOLN
500.0000 mg | INTRAVENOUS | Status: AC
  Administered 2020-03-14 – 2020-03-16 (×3): 500 mg via INTRAVENOUS
  Filled 2020-03-14 (×3): qty 500

## 2020-03-14 MED ORDER — OXYCODONE-ACETAMINOPHEN 5-325 MG PO TABS
1.0000 | ORAL_TABLET | Freq: Four times a day (QID) | ORAL | Status: DC | PRN
  Administered 2020-03-15 – 2020-03-18 (×5): 1 via ORAL
  Filled 2020-03-14 (×6): qty 1

## 2020-03-14 MED ORDER — ENSURE ENLIVE PO LIQD
1.0000 | Freq: Three times a day (TID) | ORAL | Status: DC
  Administered 2020-03-14 – 2020-03-17 (×4): 237 mL via ORAL
  Filled 2020-03-14: qty 237

## 2020-03-14 MED ORDER — HYDROXYUREA 500 MG PO CAPS
500.0000 mg | ORAL_CAPSULE | Freq: Two times a day (BID) | ORAL | Status: DC
  Administered 2020-03-14 – 2020-03-16 (×4): 500 mg via ORAL
  Filled 2020-03-14 (×5): qty 1

## 2020-03-14 MED ORDER — VITAMIN D 25 MCG (1000 UNIT) PO TABS
2000.0000 [IU] | ORAL_TABLET | Freq: Every day | ORAL | Status: DC
  Administered 2020-03-14 – 2020-03-19 (×6): 2000 [IU] via ORAL
  Filled 2020-03-14 (×6): qty 2

## 2020-03-14 MED ORDER — HYDROMORPHONE 1 MG/ML IV SOLN
INTRAVENOUS | Status: AC
  Administered 2020-03-14: 30 mg via INTRAVENOUS
  Administered 2020-03-14: 6 mg via INTRAVENOUS
  Filled 2020-03-14: qty 30

## 2020-03-14 MED ORDER — HYDROMORPHONE 1 MG/ML IV SOLN: INTRAVENOUS | Status: DC

## 2020-03-14 MED ORDER — OXYCODONE HCL ER 20 MG PO T12A
20.0000 mg | EXTENDED_RELEASE_TABLET | Freq: Two times a day (BID) | ORAL | Status: DC
  Administered 2020-03-14 – 2020-03-19 (×12): 20 mg via ORAL
  Filled 2020-03-14 (×9): qty 1
  Filled 2020-03-14 (×2): qty 2
  Filled 2020-03-14: qty 1

## 2020-03-14 MED ORDER — SODIUM CHLORIDE 0.9 % IV SOLN
2.0000 g | INTRAVENOUS | Status: AC
  Administered 2020-03-14 – 2020-03-18 (×5): 2 g via INTRAVENOUS
  Filled 2020-03-14: qty 20
  Filled 2020-03-14 (×2): qty 2
  Filled 2020-03-14 (×2): qty 20

## 2020-03-14 MED ORDER — GABAPENTIN 300 MG PO CAPS
300.0000 mg | ORAL_CAPSULE | Freq: Three times a day (TID) | ORAL | Status: DC
  Administered 2020-03-14 – 2020-03-19 (×15): 300 mg via ORAL
  Filled 2020-03-14 (×15): qty 1

## 2020-03-14 MED ORDER — ONDANSETRON HCL 4 MG/2ML IJ SOLN: 4.0000 mg | Freq: Four times a day (QID) | INTRAMUSCULAR | Status: DC | PRN

## 2020-03-14 MED ORDER — DIPHENHYDRAMINE HCL 25 MG PO CAPS
25.0000 mg | ORAL_CAPSULE | ORAL | Status: DC | PRN
  Administered 2020-03-16: 25 mg via ORAL

## 2020-03-14 NOTE — ED Notes (Signed)
[place] [facility] TO [facility] BY Tahja Liao

## 2020-03-14 NOTE — Progress Notes (Signed)
Dilaudid checked on PCA pump with rapid response RN, Antoine Primas.Marland Kitchen  Patient has received 6 mg.  Had trouble documenting it in [facility]. Volume cleared in alaris pump

## 2020-03-14 NOTE — Progress Notes (Signed)
   03/14/20 2104 03/14/20 2203  Assess: MEWS Score  Temp (!) 101.2 F (38.4 C) 99.9 F (37.7 C)  BP (!) 111/58 125/71  Pulse Rate (!) 128 (!) 116  Resp 17 16  SpO2 91 % 93 %  O2 Device Room Air  --   Assess: MEWS Score  MEWS Temp 1 0  MEWS Systolic 0 0  MEWS Pulse 2 2  MEWS RR 0 0  MEWS LOC 0 0  MEWS Score 3 2  MEWS Score Color Yellow Yellow  Assess: if the MEWS score is Yellow or Red  Were vital signs taken at a resting state? Yes Yes  Focused Assessment Change from prior assessment (see assessment flowsheet) Change from prior assessment (see assessment flowsheet)  Early Detection of Sepsis Score *See Row Information* High High  MEWS guidelines implemented *See Row Information* Yes Yes

## 2020-03-14 NOTE — H&P (Addendum)
History and Physical  Timothy Marks [id number] DOB: Feb 01, 1991 DOA: 03/13/2020  Referring physician: Dr. Christy Gentles  PCP: Tresa Garter, MD  Outpatient Specialists: Sickle cell clinic. Patient coming from: Home.  Chief Complaint: Pain in lower back and in both arms and legs.    HPI: Timothy Marks is a 30 y.o. male with medical history significant for sickle cell disease, chronic pain syndrome, opiate dependence and tolerance, left lower extremity DVT, COVID-19 viral infection on 01/22/2020, who presented to [facility] ED with complaints of lower back pain and bilateral upper and lower extremities pain.  Pain is similar to prior sickle cell pain crisis.  Associated with severe pain in all of his joints.  He denies any chest pain or dyspnea.  Denies any fevers or GI symptoms.  He presented to the ED for further evaluation and management.  Pain not controlled in the ED.  EDP requested admission for pain control.  TRH was asked to admit.  Patient admitted at [facility] long hospital to [place] with remote telemetry as observation status.  ED Course: T-max 99.7, heart rate 126, respiratory rate 38.  Lab studies remarkable for alkaline phosphatase 140, AST 53, T bilirubin 6.7, WBC 18.8K, hemoglobin 8.3K.  Review of Systems: Review of systems as noted in the HPI. All other systems reviewed and are negative.   Past Medical History:  Diagnosis Date  . Depression   . Generalized weakness 03/31/2019  . 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 Sickle cell anemia (HCC)   . Vitamin D deficiency y-6   Past Surgical History:  Procedure Laterality Date  . CHOLECYSTECTOMY      Social History:  reports that he has never smoked. He has never used smokeless tobacco. He reports previous drug use. Drug: Marijuana. He reports that he does not drink alcohol.   Allergies  Allergen Reactions  . Buprenorphine Hcl Hives  . Levaquin [Levofloxacin]  Itching  . Meperidine Rash  . Morphine Hives  . Morphine And Related Hives  . Toradol [Ketorolac Tromethamine] Itching  . Tramadol Hives  . Vancomycin Itching  . Zosyn [Piperacillin Sod-Tazobactam So] Itching, Rash and Other (See Comments)    Has taken rocephin in the past    Family History  Problem Relation Age of Onset  . Sickle cell trait Mother   . Sickle cell trait Father       Prior to Admission medications   Medication Sig Start Date End Date Taking? Authorizing Provider  cholecalciferol (VITAMIN D) 1000 units tablet Take 2 tablets (2,000 Units total) by mouth daily. 12/13/17   Azzie Glatter, FNP  folic acid (FOLVITE) 1 MG tablet Take 1 tablet (1 mg total) by mouth daily. 05/01/17   Dorena Dew, FNP  gabapentin (NEURONTIN) 300 MG capsule Take 1 capsule (300 mg total) by mouth 3 (three) times daily. 03/12/19   Dorena Dew, FNP  hydroxyurea (HYDREA) 500 MG capsule Take 1 capsule (500 mg total) by mouth 2 (two) times daily. May take with food to minimize GI side effects. Patient taking differently: Take 500 mg by mouth 2 (two) times daily. 05/01/17   Dorena Dew, FNP  hydrOXYzine (ATARAX/VISTARIL) 10 MG tablet Take 1 tablet (10 mg total) by mouth 3 (three) times daily as needed for itching. 01/16/20   Tresa Garter, MD  Nutritional Supplements (ENSURE ACTIVE HIGH PROTEIN) LIQD Take 1 each by mouth 3 (three) times daily between meals. #90 cans of Ensure  Patient taking differently: Take 1 each by mouth See admin instructions. 5 times daily 11/13/18   Dorena Dew, FNP  oxyCODONE-acetaminophen (PERCOCET) 10-325 MG tablet Take 1 tablet by mouth every 6 (six) hours as needed for up to 15 days for pain. 03/08/20 03/23/20  Tresa Garter, MD  OXYCONTIN 20 MG 12 hr tablet Take 1 tablet (20 mg total) by mouth every 12 (twelve) hours. 02/26/20 03/27/20  Tresa Garter, MD  rivaroxaban (XARELTO) 20 MG TABS tablet Take 1 tablet (20 mg total) by mouth daily  with supper. 03/11/20 04/10/20  Tresa Garter, MD    Physical Exam: BP 121/84   Pulse (!) 114   Temp 99.7 F (37.6 C) (Oral)   Resp 15   SpO2 90%   . General: 30 y.o. year-old male well developed well nourished in no acute distress.  Alert and oriented x3. Uncomfortable due to diffuse pain. . Cardiovascular: Tachycardic with no rubs or gallops.  No thyromegaly or JVD noted.  No lower extremity edema. 2/4 pulses in all 4 extremities. Marland Kitchen Respiratory: Clear to auscultation with no wheezes or rales. Good inspiratory effort. . Abdomen: Soft nontender nondistended with normal bowel sounds x4 quadrants. . Muskuloskeletal: No cyanosis, clubbing or edema noted bilaterally . Neuro: CN II-XII intact, strength, sensation, reflexes . Skin: No ulcerative lesions noted or rashes . Psychiatry: Judgement and insight appear normal. Mood is appropriate for condition and setting          Labs on Admission:  Basic Metabolic Panel: Recent Labs  Lab 03/12/20 1010 03/13/20 2155  NA 139 136  K 3.3* 3.7  CL 103 102  CO2 26 23  GLUCOSE 104* 136*  BUN <5* 11  CREATININE 0.64 0.84  CALCIUM 9.0 9.1   Liver Function Tests: Recent Labs  Lab 03/12/20 1010 03/13/20 2155  AST 27 53*  ALT 11 27  ALKPHOS 84 140*  BILITOT 3.2* 6.7*  PROT 7.7 7.4  ALBUMIN 4.2 3.9   No results for input(s): LIPASE, AMYLASE in the last 168 hours. No results for input(s): AMMONIA in the last 168 hours. CBC: Recent Labs  Lab 03/12/20 1010 03/13/20 2155  WBC 12.2* 18.8*  NEUTROABS 8.0* 15.1*  HGB 9.1* 8.3*  HCT 26.1* 23.8*  MCV 89.1 87.5  PLT 349 294   Cardiac Enzymes: No results for input(s): CKTOTAL, CKMB, CKMBINDEX, TROPONINI in the last 168 hours.  BNP (last 3 results) No results for input(s): BNP in the last 8760 hours.  ProBNP (last 3 results) No results for input(s): PROBNP in the last 8760 hours.  CBG: No results for input(s): GLUCAP in the last 168 hours.  Radiological Exams on  Admission: DG Chest Portable 1 View  Result Date: 03/13/2020 CLINICAL DATA:  Sickle cell pain EXAM: PORTABLE CHEST 1 VIEW COMPARISON:  12/19/2019 FINDINGS: Linear densities in the lung bases, likely atelectasis. Heart is normal size. No effusions. No acute bony abnormality. IMPRESSION: Bibasilar atelectasis.  No acute cardiopulmonary disease. Electronically Signed   By: Rolm Baptise M.D.   On: 03/13/2020 22:17    EKG: I independently viewed the EKG done and my findings are as followed: None available at the time of this dictation.  Assessment/Plan Present on Admission: . Sickle cell crisis (HCC)  Active Problems:   Sickle cell crisis (HCC)  Sickle cell pain crisis Presented with diffuse pain mainly affecting his joints, lower back, and bilateral upper and lower extremities. Denies any chest pain or dyspnea Resume his home pain control regimen  Add Dilaudid PCA pump Add bowel regimen to avoid opiate-induced constipation. Start IV fluid hydration normal saline at 100 cc/h x 1 day. Add oxygen supplementation 2 L nasal cannula, maintain O2 saturation greater than 92%.  Chronic pain syndrome/opiate dependence and tolerance Resume home regimen Added PCA pump Dilaudid x12 hours until sickle cell crisis team takes over in the morning Reassess the need for Dilaudid PCA pump in the morning.  Leukocytosis, likely reactive in the setting of sickle cell pain crisis Presented with WBC 18.8K Repeat CBC with differential in the morning Monitor fever curve and WBC  Sinus tachycardia likely secondary to dehydration and intractable pain Start IV fluid hydration Dilaudid PCA pump for pain control  Elevated liver chemistries Presented with elevated alkaline phosphatase, AST and T bilirubin Denies any abdominal pain Repeat levels in the morning Avoid hepatotoxic agents.  Anemia of chronic disease Hemoglobin is stable at 8.3K, MCV 87. No overt bleeding Monitor H&H and transfuse as  indicated.  Left lower extremity DVT on Xarelto Continue home Xarelto No SCDs  History of COVID-19 viral infection Positive COVID-19 screening test on 01/22/2020 Repeat COVID-19 screening test, follow results  DVT prophylaxis: Xarelto.  Code Status: Full code.  Family Communication: None at bedside.  Disposition Plan: Admit to MedSurg with remote telemetry at [facility].  Consults called: This is a patient of Dr. Doreene Burke.  Contact sickle cell crisis team at [facility] for morning pickup.  Admission status: Observation status.   Status is: Observation   Dispo:  Patient From: Home  Planned Disposition: Home  Expected discharge date: 03/15/2020  Medically stable for discharge: No, ongoing management of sickle cell pain crisis.       Kayleen Memos MD [facility] Pager [phone]  If 7PM-7AM, please contact night-coverage [email or web address] Password [facility]  03/14/2020, 3:34 AM

## 2020-03-14 NOTE — Progress Notes (Signed)
Arrived to ED, PIV established by ED RN. Cancel consult.

## 2020-03-14 NOTE — Progress Notes (Signed)
[facility] Brief Note  In to see patient for fever. He is alert, oriented. Endorses pain in RUE and throughout legs, similar to prior vaso occlusive crisis   Vitals:   03/14/20 0638 03/14/20 0739  BP:  119/73  Pulse:  (!) 108  Resp: (!) 23 16  Temp:  (!) 101.1 F (38.4 C)  SpO2: 90% 95%    Cardiac: Regular rate and rhythm. Normal S1/S2. No murmurs, rubs, or gallops appreciated. Lungs: Clear bilaterally to ascultation.  Abdomen: Normoactive bowel sounds. No tenderness to deep or light palpation. No rebound or guarding.   Psych: Appropriate but tangential   BP cuff measuring air (off arm) in room, MAP appropriate   UE and LE joints examinated, no erythema or swelling of joints UE and LE reflexes and strength tested, symmetric and intact  Fever in sickle cell patient - Per patient has history of acute chest - CXR reviewed, shows atelectasis, - Given concern for possible infection, blood and urine cultures. Repeat CXR as indicated, started ceftriaxone + azithromycin, if worsens, broaden to MRSA and Pseudomonas coverage - Allergic to Zosyn, tolerated Ceftriaxone previously - Continue close monitoring   Dorris Singh, MD  [facility] Medicine Teaching Service

## 2020-03-14 NOTE — ED Notes (Signed)
This RN wasted 17mg /62ml of dilaudid witness by Wyline Beady RN

## 2020-03-14 NOTE — ED Notes (Signed)
C/o pain all over he has taken ocycodone and ocycontin and still having pain

## 2020-03-14 NOTE — ED Notes (Signed)
Report has been given to [facility] ETA 10 mins

## 2020-03-14 NOTE — ED Notes (Signed)
Dr. Brown at bedside

## 2020-03-14 NOTE — ED Notes (Signed)
MS Breakfast Ordered 

## 2020-03-14 NOTE — ED Notes (Signed)
Pt constantly having to be reminded to keep monitoring devices in place and to not remove them

## 2020-03-14 NOTE — ED Notes (Signed)
Attempted report 

## 2020-03-14 NOTE — ED Notes (Signed)
pca pump started by dave rn rapid response

## 2020-03-14 NOTE — ED Notes (Signed)
Pt d/c by MD and is transferred to [facility] by [employer]

## 2020-03-14 NOTE — ED Notes (Signed)
Pt given dilaudid 1mg  at 0130

## 2020-03-14 NOTE — ED Notes (Signed)
IV team at bedside 

## 2020-03-14 NOTE — ED Notes (Signed)
The pt has c/o everything all  Night nothing pleases this pt.  He will not keep the bp cuff on his pulse ox attached and c/o everything

## 2020-03-14 NOTE — Progress Notes (Signed)
   03/14/20 1656  Assess: MEWS Score  Temp (!) 100.5 F (38.1 C)  BP 128/71  Pulse Rate (!) 123  Resp 15  SpO2 94 %  Assess: MEWS Score  MEWS Temp 1  MEWS Systolic 0  MEWS Pulse 2  MEWS RR 0  MEWS LOC 0  MEWS Score 3  MEWS Score Color Yellow  Assess: if the MEWS score is Yellow or Red  Were vital signs taken at a resting state? Yes  Focused Assessment Change from prior assessment (see assessment flowsheet) (inittal assessment)  Early Detection of Sepsis Score *See Row Information* Medium  MEWS guidelines implemented *See Row Information* Yes  Escalate  MEWS: Escalate Yellow: discuss with charge nurse/RN and consider discussing with provider and RRT  Notify: Charge Nurse/RN  Name of Charge Nurse/RN Notified Stephan Minister RN  Date Charge Nurse/RN Notified 03/14/20  Time Charge Nurse/RN Notified 1700  Notify: Provider  Provider Name/Title Dr Jonelle Sidle  Date Provider Notified 03/14/20  Time Provider Notified 1710  Notification Type Call  Notification Reason Other (Comment) (initial assessment, temp. HR)  Response See new orders  Date of Provider Response 03/14/20  Time of Provider Response 1710  Document  Patient Outcome Stabilized after interventions  Progress note created (see row info) Yes

## 2020-03-15 LAB — URINE CULTURE: Culture: NO GROWTH

## 2020-03-15 MED ORDER — SODIUM CHLORIDE 0.9 % IV SOLN: INTRAVENOUS | Status: DC | PRN

## 2020-03-15 NOTE — Progress Notes (Signed)
   03/15/20 0448  Assess: MEWS Score  Temp (!) 103.1 F (39.5 C)  BP 118/63  Pulse Rate (!) 135  Resp 20  SpO2 93 %  Assess: MEWS Score  MEWS Temp 2  MEWS Systolic 0  MEWS Pulse 3  MEWS RR 0  MEWS LOC 0  MEWS Score 5  MEWS Score Color Red  Assess: if the MEWS score is Yellow or Red  Were vital signs taken at a resting state? Yes  Focused Assessment Change from prior assessment (see assessment flowsheet)  Early Detection of Sepsis Score *See Row Information* Medium  MEWS guidelines implemented *See Row Information* No, previously red, continue vital signs every 4 hours   Patient given Tylenol 650 mg. No signs of distress noted. Will continue to monitor.

## 2020-03-15 NOTE — Progress Notes (Signed)
Subjective: Patient is 30 year old well-known to our service admitted with sickle cell crisis.  He also has fever and mild leukocytosis.  He is still having pain at 8 out of 10.  Denied any nausea vomiting.  He is currently on Dilaudid PCA with his OxyContin and oxycodone ordered.  No Toradol on board.  Objective: Vital signs in last 24 hours: Temp:  [98.7 F (37.1 C)-103.1 F (39.5 C)] 99.8 F (37.7 C) (01/30 1117) Pulse Rate:  [108-135] 122 (01/30 1117) Resp:  [12-26] 18 (01/30 1117) BP: (107-130)/(58-78) 130/67 (01/30 1117) SpO2:  [91 %-100 %] 98 % (01/30 1117) FiO2 (%):  [0 %] 0 % (01/29 1743) Weight change:  Last BM Date: 03/12/20  Intake/Output from previous day: 01/29 0701 - 01/30 0700 In: 349.7 [IV Piggyback:349.7] Out: 2275 [Urine:2275] Intake/Output this shift: No intake/output data recorded.  General appearance: alert, cooperative, appears stated age and no distress Head: Normocephalic, without obvious abnormality, atraumatic Eyes: conjunctivae/corneas clear. PERRL, EOM's intact. Fundi benign. Back: symmetric, no curvature. ROM normal. No CVA tenderness. Resp: clear to auscultation bilaterally Cardio: regular rate and rhythm, S1, S2 normal, no murmur, click, rub or gallop GI: soft, non-tender; bowel sounds normal; no masses,  no organomegaly Extremities: extremities normal, atraumatic, no cyanosis or edema Pulses: 2+ and symmetric Skin: Skin color, texture, turgor normal. No rashes or lesions Neurologic: Grossly normal  Lab Results: Recent Labs    03/13/20 2155 03/14/20 0459  WBC 18.8* 20.7*  HGB 8.3* 7.6*  HCT 23.8* 20.5*  PLT 294 289   BMET Recent Labs    03/13/20 2155 03/14/20 0459  NA 136 132*  K 3.7 3.7  CL 102 100  CO2 23 21*  GLUCOSE 136* 108*  BUN 11 10  CREATININE 0.84 0.74  CALCIUM 9.1 8.5*    Studies/Results: DG Chest Portable 1 View  Result Date: 03/13/2020 CLINICAL DATA:  Sickle cell pain EXAM: PORTABLE CHEST 1 VIEW COMPARISON:   12/19/2019 FINDINGS: Linear densities in the lung bases, likely atelectasis. Heart is normal size. No effusions. No acute bony abnormality. IMPRESSION: Bibasilar atelectasis.  No acute cardiopulmonary disease. Electronically Signed   By: Rolm Baptise M.D.   On: 03/13/2020 22:17    Medications: I have reviewed the patient's current medications.  Assessment/Plan: 30 year old gentleman admitted with sickle cell painful crisis.  #1 sickle cell painful crisis: Patient will be maintained on current regiment of Dilaudid PCA with his OxyContin and oxycodone.  We will add Toradol unless unable to tolerate.  Renal function appears stable.  Continue D5 normal saline.  #2 anemia of chronic disease: Hemoglobin is at baseline of 7.2.  No change.  #3 leukocytosis: Probably due to vaso-occlusive crisis.  Continue to monitor  #4 hyponatremia: Hydrate and monitor  #5 aggressive behavior: Chronic.  Counseled for the patient again.  #6 chronic anticoagulation: Due to history of DVT.  Continue monitoring.  LOS: 1 day   Sandar Krinke,LAWAL 03/15/2020, 1:49 PM

## 2020-03-16 ENCOUNTER — Inpatient Hospital Stay (HOSPITAL_COMMUNITY): Payer: Medicare Other

## 2020-03-16 DIAGNOSIS — D5700 Hb-SS disease with crisis, unspecified: Principal | ICD-10-CM

## 2020-03-16 DIAGNOSIS — R509 Fever, unspecified: Secondary | ICD-10-CM

## 2020-03-16 DIAGNOSIS — D72829 Elevated white blood cell count, unspecified: Secondary | ICD-10-CM

## 2020-03-16 DIAGNOSIS — R4689 Other symptoms and signs involving appearance and behavior: Secondary | ICD-10-CM

## 2020-03-16 DIAGNOSIS — E876 Hypokalemia: Secondary | ICD-10-CM

## 2020-03-16 DIAGNOSIS — G8929 Other chronic pain: Secondary | ICD-10-CM

## 2020-03-16 LAB — CBC WITH DIFFERENTIAL/PLATELET
Abs Immature Granulocytes: 0.1 10*3/uL — ABNORMAL HIGH (ref 0.00–0.07)
Basophils Absolute: 0 10*3/uL (ref 0.0–0.1)
Basophils Relative: 0 %
Eosinophils Absolute: 0 10*3/uL (ref 0.0–0.5)
Eosinophils Relative: 0 %
HCT: 16.1 % — ABNORMAL LOW (ref 39.0–52.0)
Hemoglobin: 5.4 g/dL — CL (ref 13.0–17.0)
Immature Granulocytes: 1 %
Lymphocytes Relative: 17 %
Lymphs Abs: 2.8 10*3/uL (ref 0.7–4.0)
MCH: 29.2 pg (ref 26.0–34.0)
MCHC: 33.5 g/dL (ref 30.0–36.0)
MCV: 87 fL (ref 80.0–100.0)
Monocytes Absolute: 1.5 10*3/uL — ABNORMAL HIGH (ref 0.1–1.0)
Monocytes Relative: 9 %
Neutro Abs: 12.6 10*3/uL — ABNORMAL HIGH (ref 1.7–7.7)
Neutrophils Relative %: 73 %
Platelets: 305 10*3/uL (ref 150–400)
RBC: 1.85 MIL/uL — ABNORMAL LOW (ref 4.22–5.81)
RDW: 19.6 % — ABNORMAL HIGH (ref 11.5–15.5)
WBC: 17 10*3/uL — ABNORMAL HIGH (ref 4.0–10.5)
nRBC: 3.2 % — ABNORMAL HIGH (ref 0.0–0.2)

## 2020-03-16 LAB — COMPREHENSIVE METABOLIC PANEL
ALT: 23 U/L (ref 0–44)
AST: 56 U/L — ABNORMAL HIGH (ref 15–41)
Albumin: 3.7 g/dL (ref 3.5–5.0)
Alkaline Phosphatase: 89 U/L (ref 38–126)
Anion gap: 10 (ref 5–15)
BUN: 5 mg/dL — ABNORMAL LOW (ref 6–20)
CO2: 26 mmol/L (ref 22–32)
Calcium: 8.4 mg/dL — ABNORMAL LOW (ref 8.9–10.3)
Chloride: 100 mmol/L (ref 98–111)
Creatinine, Ser: 0.46 mg/dL — ABNORMAL LOW (ref 0.61–1.24)
GFR, Estimated: >60 mL/min (ref >60)
Glucose, Bld: 109 mg/dL — ABNORMAL HIGH (ref 70–99)
Potassium: 2.8 mmol/L — ABNORMAL LOW (ref 3.5–5.1)
Sodium: 136 mmol/L (ref 135–145)
Total Bilirubin: 3.5 mg/dL — ABNORMAL HIGH (ref 0.3–1.2)
Total Protein: 6.8 g/dL (ref 6.5–8.1)

## 2020-03-16 LAB — PREPARE RBC (CROSSMATCH)

## 2020-03-16 MED ORDER — POTASSIUM CHLORIDE CRYS ER 20 MEQ PO TBCR
40.0000 meq | EXTENDED_RELEASE_TABLET | Freq: Every day | ORAL | Status: DC
  Administered 2020-03-16 – 2020-03-19 (×4): 40 meq via ORAL
  Filled 2020-03-16 (×4): qty 2

## 2020-03-16 MED ORDER — SODIUM CHLORIDE 0.9% IV SOLUTION
Freq: Once | INTRAVENOUS | Status: AC
  Administered 2020-03-16: 250 mL via INTRAVENOUS

## 2020-03-16 MED ORDER — ACETAMINOPHEN 325 MG PO TABS
650.0000 mg | ORAL_TABLET | Freq: Once | ORAL | Status: AC
  Administered 2020-03-16: 650 mg via ORAL
  Filled 2020-03-16: qty 2

## 2020-03-16 MED ORDER — DIPHENHYDRAMINE HCL 50 MG/ML IJ SOLN
25.0000 mg | Freq: Once | INTRAMUSCULAR | Status: AC
  Administered 2020-03-16: 25 mg via INTRAVENOUS
  Filled 2020-03-16: qty 1

## 2020-03-16 NOTE — Progress Notes (Signed)
   03/16/20 1031  Assess: MEWS Score  Temp 99.9 F (37.7 C)  BP 116/61  Pulse Rate (!) 126  Resp 16  SpO2 96 %  O2 Device Room Air  Assess: MEWS Score  MEWS Temp 0  MEWS Systolic 0  MEWS Pulse 2  MEWS RR 0  MEWS LOC 0  MEWS Score 2  MEWS Score Color Yellow  Assess: if the MEWS score is Yellow or Red  Were vital signs taken at a resting state? Yes  Focused Assessment Change from prior assessment (see assessment flowsheet)  Early Detection of Sepsis Score *See Row Information* Low  MEWS guidelines implemented *See Row Information* No, previously yellow, continue vital signs every 4 hours  Treat  MEWS Interventions Other (Comment) (given incentive spirometer)  Pain Scale 0-10  Pain Score 8  Pain Type Acute pain  Pain Location Generalized  Pain Descriptors / Indicators Aching  Pain Frequency Constant  Pain Onset On-going  Pain Intervention(s) PCA encouraged  Take Vital Signs  Increase Vital Sign Frequency  Yellow: Q 2hr X 2 then Q 4hr X 2, if remains yellow, continue Q 4hrs  Escalate  MEWS: Escalate Yellow: discuss with charge nurse/RN and consider discussing with provider and RRT  Notify: Charge Nurse/RN  Name of Charge Nurse/RN Notified Melissa  Date Charge Nurse/RN Notified 03/16/20  Time Charge Nurse/RN Notified 1040  Notify: Provider  Provider Name/Title [place] Hollis  Date Provider Notified 03/16/20  Time Provider Notified 1150  Notification Type Call  Notification Reason Other (Comment) (Pulse continues to run high)  Response No new orders  Date of Provider Response 03/16/20  Time of Provider Response 1200  Document  Patient Outcome Other (Comment) (Patient remains running tachy, that is unchanged)  Progress note created (see row info) Yes

## 2020-03-16 NOTE — Progress Notes (Signed)
Subjective: Timothy Marks is a 30 year old male with a medical history significant for sickle cell disease, chronic pain syndrome, opiate dependence and tolerance, history of anemia of chronic disease, and oppositional defiance disorder was admitted for sickle cell pain crisis. Patient was periodically febrile overnight and heart rate ranging 110s-120s.  Most recent chest x-ray shows bibasilar atelectasis. Today, hemoglobin is 5.4, which is decreased from patient's baseline of 8-9 g/dL.  Patient denies any headache, shortness of breath, chest pain, urinary symptoms, nausea, vomiting, or diarrhea.  Patient complaining of 7/10 pain primarily to low back and lower extremities. He exhibited aggressive behavior upon entering room.  Patient began talking loudly about not being able to come to the sickle cell clinic on last week and says that this is the reason for his admission.  Objective:  Vital signs in last 24 hours:  Vitals:   03/16/20 0907 03/16/20 1031 03/16/20 1155 03/16/20 1209  BP:  116/61 119/66   Pulse:  (!) 126 (!) 119   Resp: 17 16  15   Temp:  99.9 F (37.7 C) 99.5 F (37.5 C)   TempSrc:  Oral    SpO2:  96% 94% 96%    Intake/Output from previous day:   Intake/Output Summary (Last 24 hours) at 03/16/2020 1327 Last data filed at 03/16/2020 3086 Gross per 24 hour  Intake 2563.25 ml  Output 2450 ml  Net 113.25 ml    Physical Exam: General: Alert, awake, oriented x3, in no acute distress.  HEENT: [place]/AT PEERL, EOMI Neck: Trachea midline,  no masses, no thyromegal,y no JVD, no carotid bruit OROPHARYNX:  Moist, No exudate/ erythema/lesions.  Heart: Regular rate and rhythm, without murmurs, rubs, gallops, PMI non-displaced, no heaves or thrills on palpation.  Lungs: Clear to auscultation, no wheezing or rhonchi noted. No increased vocal fremitus resonant to percussion  Abdomen: Soft, nontender, nondistended, positive bowel sounds, no masses no hepatosplenomegaly noted..  Neuro:  No focal neurological deficits noted cranial nerves II through XII grossly intact. DTRs 2+ bilaterally upper and lower extremities. Strength 5 out of 5 in bilateral upper and lower extremities. Musculoskeletal: No warm swelling or erythema around joints, no spinal tenderness noted. Psychiatric: Patient alert and oriented x3, good insight and cognition, good recent to remote recall. Lymph node survey: No cervical axillary or inguinal lymphadenopathy noted.  Lab Results:  Basic Metabolic Panel:    Component Value Date/Time   NA 136 03/16/2020 0710   NA 139 08/31/2018 0854   K 2.8 (L) 03/16/2020 0710   CL 100 03/16/2020 0710   CO2 26 03/16/2020 0710   BUN 5 (L) 03/16/2020 0710   BUN 6 08/31/2018 0854   CREATININE 0.46 (L) 03/16/2020 0710   GLUCOSE 109 (H) 03/16/2020 0710   CALCIUM 8.4 (L) 03/16/2020 0710   CBC:    Component Value Date/Time   WBC 17.0 (H) 03/16/2020 0710   HGB 5.4 (LL) 03/16/2020 0710   HGB 8.0 (L) 11/13/2018 1408   HCT 16.1 (L) 03/16/2020 0710   HCT 25.1 (L) 11/13/2018 1408   PLT 305 03/16/2020 0710   PLT 792 (H) 11/13/2018 1408   MCV 87.0 03/16/2020 0710   MCV 95 11/13/2018 1408   NEUTROABS 12.6 (H) 03/16/2020 0710   NEUTROABS 5.5 11/13/2018 1408   LYMPHSABS 2.8 03/16/2020 0710   LYMPHSABS 2.9 11/13/2018 1408   MONOABS 1.5 (H) 03/16/2020 0710   EOSABS 0.0 03/16/2020 0710   EOSABS 0.0 11/13/2018 1408   BASOSABS 0.0 03/16/2020 0710   BASOSABS 0.0 11/13/2018 1408  Recent Results (from the past 240 hour(s))  SARS CORONAVIRUS 2 (TAT 6-24 HRS) Nasopharyngeal Nasopharyngeal Swab     Status: None   Collection Time: 03/14/20  4:40 AM   Specimen: Nasopharyngeal Swab  Result Value Ref Range Status   SARS Coronavirus 2 NEGATIVE NEGATIVE Final    Comment: (NOTE) SARS-CoV-2 target nucleic acids are NOT DETECTED.  The SARS-CoV-2 RNA is generally detectable in upper and lower respiratory specimens during the acute phase of infection. Negative results do not  preclude SARS-CoV-2 infection, do not rule out co-infections with other pathogens, and should not be used as the sole basis for treatment or other patient management decisions. Negative results must be combined with clinical observations, patient history, and epidemiological information. The expected result is Negative.  Fact Sheet for Patients: SugarRoll.be  Fact Sheet for Healthcare Providers: https://www.woods-mathews.com/  This test is not yet approved or cleared by the [place] FDA and  has been authorized for detection and/or diagnosis of SARS-CoV-2 by FDA under an Emergency Use Authorization (EUA). This EUA will remain  in effect (meaning this test can be used) for the duration of the COVID-19 declaration under Se ction 564(b)(1) of the Act, 21 U.S.C. section 360bbb-3(b)(1), unless the authorization is terminated or revoked sooner.  Performed at [facility], [place] [address] [place], [place] [address]   Culture, blood (routine x 2)     Status: None (Preliminary result)   Collection Time: 03/14/20 10:25 AM   Specimen: BLOOD RIGHT HAND  Result Value Ref Range Status   Specimen Description BLOOD RIGHT HAND  Final   Special Requests   Final    BOTTLES DRAWN AEROBIC AND ANAEROBIC Blood Culture results may not be optimal due to an excessive volume of blood received in culture bottles   Culture   Final    NO GROWTH < 24 HOURS Performed at [facility], [place] [address], [place], [place] [address]    Report Status PENDING  Incomplete  Culture, Urine     Status: None   Collection Time: 03/14/20 11:11 AM   Specimen: Urine, Random  Result Value Ref Range Status   Specimen Description URINE, RANDOM  Final   Special Requests NONE  Final   Culture   Final    NO GROWTH Performed at [facility], [place] [address], [place], [place] [address]    Report Status 03/15/2020 FINAL  Final  Culture, blood (routine x 2)      Status: None (Preliminary result)   Collection Time: 03/14/20  2:40 PM   Specimen: BLOOD RIGHT FOREARM  Result Value Ref Range Status   Specimen Description BLOOD RIGHT FOREARM  Final   Special Requests   Final    BOTTLES DRAWN AEROBIC AND ANAEROBIC Blood Culture results may not be optimal due to an excessive volume of blood received in culture bottles   Culture   Final    NO GROWTH < 24 HOURS Performed at [facility], [place] [address] [place], [place] [address]    Report Status PENDING  Incomplete    Studies/Results: No results found.  Medications: Scheduled Meds: . sodium chloride   Intravenous Once  . acetaminophen  650 mg Oral Once  . cholecalciferol  2,000 Units Oral Daily  . diphenhydrAMINE  25 mg Intravenous Once  . feeding supplement  1 Bottle Oral TID BM  . folic acid  1 mg Oral Daily  . gabapentin  300 mg Oral TID  . HYDROmorphone  Intravenous Q4H  . oxyCODONE  20 mg Oral Q12H  . potassium chloride SA  40 mEq Oral Daily  . rivaroxaban  20 mg Oral Q supper  . senna-docusate  2 tablet Oral BID   Continuous Infusions: . sodium chloride 10 mL/hr at 03/15/20 0537  . cefTRIAXone (ROCEPHIN)  IV 2 g (03/16/20 0904)  . dextrose 5 % and 0.45% NaCl 10 mL/hr at 03/16/20 0906  . diphenhydrAMINE     PRN Meds:.sodium chloride, acetaminophen, diphenhydrAMINE **OR** diphenhydrAMINE, naloxone **AND** sodium chloride flush, ondansetron (ZOFRAN) IV, oxyCODONE-acetaminophen **AND** oxyCODONE  Consultants:  None  Procedures:  None  Antibiotics:  IV ceftriaxone  Assessment/Plan: Principal Problem:   Sickle cell anemia with crisis ([place]) Active Problems:   Sickle cell crisis (HCC)  Sickle cell disease with pain crisis: Decrease IV fluids to KVO Continue IV Dilaudid PCA without any changes in settings today IV Toradol 15 mg every 6 hours for total of 5 days Percocet 10-325 mg every 6 hours as needed for severe breakthrough pain Monitor vital signs very closely,  reevaluate pain scale regularly, and supplemental oxygen as needed.  Fever of unknown origin: Patient febrile overnight.  IV antibiotics initiated.  Chest x-ray pending.  Blood cultures negative thus far.  Urine culture shows no growth. Continue IV antibiotics Tylenol 650 mg every 4 hours as needed for fever  Hypokalemia: Potassium decreased to 2.8.  Replete.  Follow labs in AM.  Leukocytosis: WBCs 17.0.  Patient febrile overnight.  Continue IV antibiotics.  Blood cultures negative so far.  Chest x-ray pending. Repeat CBC in a.m.  Sickle cell anemia: Hemoglobin is 5.4 which is markedly decreased from patient's baseline.  Transfuse 2 units PRBCs today.  Follow labs in AM.  Chronic pain syndrome: Continue home medications  History of DVT: Continue Xarelto.  Follow closely.  Aggressive behavior: Patient has a long history of aggressive behavior while hospitalized.  Patient counseled at length. Referred to LCSW  Code Status: Full Code Family Communication: N/A Disposition Plan: Not yet ready for discharge  [place], MSN, FNP-C Patient [place] [address] [place], [place] [address] [phone]  If 5PM-8AM, please contact night-coverage.  03/16/2020, 1:27 PM  LOS: 2 days

## 2020-03-16 NOTE — Progress Notes (Signed)
PHARMACY BRIEF NOTE: HYDROXYUREA   By [facility] Health policy, hydroxyurea is automatically held when any of the following laboratory values occur:  ANC < 2 K  Pltc < 80K in sickle-cell patients; < 100K in other patients  Hgb <= 6 in sickle-cell patients; < 8 in other patients (5.4 g/dL) Reticulocytes < 80K when Hgb < 9  Hydroxyurea has been held (discontinued from profile) per policy.    Reuel Boom, PharmD, BCPS [phone] 03/16/2020, 10:22 AM

## 2020-03-17 DIAGNOSIS — D5700 Hb-SS disease with crisis, unspecified: Secondary | ICD-10-CM | POA: Diagnosis not present

## 2020-03-17 LAB — BPAM RBC
Blood Product Expiration Date: 202202242359
Blood Product Expiration Date: 202202252359
ISSUE DATE / TIME: 202201311843
ISSUE DATE / TIME: 202201312132
Unit Type and Rh: 9500
Unit Type and Rh: 9500

## 2020-03-17 LAB — TYPE AND SCREEN
ABO/RH(D): A POS
Antibody Screen: NEGATIVE
Donor AG Type: NEGATIVE
Donor AG Type: NEGATIVE
Unit division: 0
Unit division: 0

## 2020-03-17 LAB — HEMOGLOBIN AND HEMATOCRIT, BLOOD
HCT: 21.5 % — ABNORMAL LOW (ref 39.0–52.0)
Hemoglobin: 7.5 g/dL — ABNORMAL LOW (ref 13.0–17.0)

## 2020-03-17 NOTE — Care Management Important Message (Signed)
Important Message  Patient Details IM Letter given to the Patient. Name: Timothy Marks MRN: [id number] Date of Birth: 06/09/1990   Medicare Important Message Given:  Yes     Kerin Salen 03/17/2020, 10:08 AM

## 2020-03-17 NOTE — Progress Notes (Signed)
   03/16/20 2345 03/17/20 0038 03/17/20 0137  Assess: MEWS Score  Temp (!) 100.4 F (38 C)  --  (!) 101.5 F (38.6 C)  BP (!) 116/91  --  122/64  Pulse Rate (!) 133  --  (!) 124  Resp 18 14 16   SpO2 99 %  --  100 %  O2 Device  --  Room Air Nasal Cannula  Assess: MEWS Score  MEWS Temp 0 0 2  MEWS Systolic 0 0 0  MEWS Pulse 3 3 2   MEWS RR 0 0 0  MEWS LOC 0 0 0  MEWS Score 3 3 4   MEWS Score Color Yellow Yellow Red  Assess: if the MEWS score is Yellow or Red  Were vital signs taken at a resting state? Yes  --  Yes  Focused Assessment Change from prior assessment (see assessment flowsheet)  --  Change from prior assessment (see assessment flowsheet)  Early Detection of Sepsis Score *See Row Information* Low  --  Low  MEWS guidelines implemented *See Row Information* Yes  --  Yes  Treat  MEWS Interventions Administered prn meds/treatments  --  Escalated (See documentation below)  Pain Score  --  8  --   Multiple Pain Sites Yes  --   --   Take Vital Signs  Increase Vital Sign Frequency  Yellow: Q 2hr X 2 then Q 4hr X 2, if remains yellow, continue Q 4hrs  --  Red: Q 1hr X 4 then Q 4hr X 4, if remains red, continue Q 4hrs  Notify: Charge Nurse/RN  Name of Charge Nurse/RN Notified Stephanie, RN  --  Colletta [place], RN  Date Charge Nurse/RN Notified 03/17/20  --  03/17/20  Time Charge Nurse/RN Notified 2345  --  0147  Notify: Provider  Provider Name/Title  --   --  Rhunette Croft. [place], APP  Date Provider Notified  --   --  03/17/20  Time Provider Notified  --   --  2135  Patient given 650 mg of Tylenol.

## 2020-03-17 NOTE — Progress Notes (Signed)
Subjective: Timothy Marks is a 30 year old male with medical history significant for sickle cell disease, chronic pain syndrome, opiate dependence and tolerance, history of anemia of chronic disease, and history of oppositional defiance disorder was admitted for sickle cell pain crisis.  Patient continues to complain of allover body pain.  He rates pain as 7/10.  He states that he has not been able to get out of bed without assistance due to weakness.  He denies any headache, shortness of breath, dizziness, urinary symptoms, nausea, vomiting, or diarrhea.  Objective:  Vital signs in last 24 hours:  Vitals:   03/17/20 0335 03/17/20 0425 03/17/20 0441 03/17/20 0542  BP: 106/67  110/72 108/76  Pulse: (!) 120  (!) 120 (!) 118  Resp: 16 15    Temp: 99.6 F (37.6 C)  99.4 F (37.4 C) 99.6 F (37.6 C)  TempSrc: Oral  Oral Oral  SpO2: 94% 98% 100% 96%    Intake/Output from previous day:   Intake/Output Summary (Last 24 hours) at 03/17/2020 0857 Last data filed at 03/17/2020 0548 Gross per 24 hour  Intake 2724.17 ml  Output 2560 ml  Net 164.17 ml    Physical Exam: General: Alert, awake, oriented x3, in no acute distress.  HEENT: [place]/AT PEERL, EOMI Neck: Trachea midline,  no masses, no thyromegal,y no JVD, no carotid bruit OROPHARYNX:  Moist, No exudate/ erythema/lesions.  Heart: Regular rate and rhythm, without murmurs, rubs, gallops, PMI non-displaced, no heaves or thrills on palpation.  Lungs: Clear to auscultation, no wheezing or rhonchi noted. No increased vocal fremitus resonant to percussion  Abdomen: Soft, nontender, nondistended, positive bowel sounds, no masses no hepatosplenomegaly noted..  Neuro: No focal neurological deficits noted cranial nerves II through XII grossly intact. DTRs 2+ bilaterally upper and lower extremities. Strength 5 out of 5 in bilateral upper and lower extremities. Musculoskeletal: No warm swelling or erythema around joints, no spinal tenderness  noted. Psychiatric: Patient alert and oriented x3, good insight and cognition, good recent to remote recall. Lymph node survey: No cervical axillary or inguinal lymphadenopathy noted.  Lab Results:  Basic Metabolic Panel:    Component Value Date/Time   NA 136 03/16/2020 0710   NA 139 08/31/2018 0854   K 2.8 (L) 03/16/2020 0710   CL 100 03/16/2020 0710   CO2 26 03/16/2020 0710   BUN 5 (L) 03/16/2020 0710   BUN 6 08/31/2018 0854   CREATININE 0.46 (L) 03/16/2020 0710   GLUCOSE 109 (H) 03/16/2020 0710   CALCIUM 8.4 (L) 03/16/2020 0710   CBC:    Component Value Date/Time   WBC 17.0 (H) 03/16/2020 0710   HGB 7.5 (L) 03/17/2020 0612   HGB 8.0 (L) 11/13/2018 1408   HCT 21.5 (L) 03/17/2020 0612   HCT 25.1 (L) 11/13/2018 1408   PLT 305 03/16/2020 0710   PLT 792 (H) 11/13/2018 1408   MCV 87.0 03/16/2020 0710   MCV 95 11/13/2018 1408   NEUTROABS 12.6 (H) 03/16/2020 0710   NEUTROABS 5.5 11/13/2018 1408   LYMPHSABS 2.8 03/16/2020 0710   LYMPHSABS 2.9 11/13/2018 1408   MONOABS 1.5 (H) 03/16/2020 0710   EOSABS 0.0 03/16/2020 0710   EOSABS 0.0 11/13/2018 1408   BASOSABS 0.0 03/16/2020 0710   BASOSABS 0.0 11/13/2018 1408    Recent Results (from the past 240 hour(s))  SARS CORONAVIRUS 2 (TAT 6-24 HRS) Nasopharyngeal Nasopharyngeal Swab     Status: None   Collection Time: 03/14/20  4:40 AM   Specimen: Nasopharyngeal Swab  Result Value Ref  Range Status   SARS Coronavirus 2 NEGATIVE NEGATIVE Final    Comment: (NOTE) SARS-CoV-2 target nucleic acids are NOT DETECTED.  The SARS-CoV-2 RNA is generally detectable in upper and lower respiratory specimens during the acute phase of infection. Negative results do not preclude SARS-CoV-2 infection, do not rule out co-infections with other pathogens, and should not be used as the sole basis for treatment or other patient management decisions. Negative results must be combined with clinical observations, patient history, and epidemiological  information. The expected result is Negative.  Fact Sheet for Patients: SugarRoll.be  Fact Sheet for Healthcare Providers: https://www.woods-mathews.com/  This test is not yet approved or cleared by the [place] FDA and  has been authorized for detection and/or diagnosis of SARS-CoV-2 by FDA under an Emergency Use Authorization (EUA). This EUA will remain  in effect (meaning this test can be used) for the duration of the COVID-19 declaration under Se ction 564(b)(1) of the Act, 21 U.S.C. section 360bbb-3(b)(1), unless the authorization is terminated or revoked sooner.  Performed at [facility], [place] [address], [place], [place] [address]   Culture, blood (routine x 2)     Status: None (Preliminary result)   Collection Time: 03/14/20 10:25 AM   Specimen: BLOOD RIGHT HAND  Result Value Ref Range Status   Specimen Description BLOOD RIGHT HAND  Final   Special Requests   Final    BOTTLES DRAWN AEROBIC AND ANAEROBIC Blood Culture results may not be optimal due to an excessive volume of blood received in culture bottles   Culture   Final    NO GROWTH 2 DAYS Performed at [facility], [place] [address], [place], [place] [address]    Report Status PENDING  Incomplete  Culture, Urine     Status: None   Collection Time: 03/14/20 11:11 AM   Specimen: Urine, Random  Result Value Ref Range Status   Specimen Description URINE, RANDOM  Final   Special Requests NONE  Final   Culture   Final    NO GROWTH Performed at [facility], [place] [address], [place], [place] [address]    Report Status 03/15/2020 FINAL  Final  Culture, blood (routine x 2)     Status: None (Preliminary result)   Collection Time: 03/14/20  2:40 PM   Specimen: BLOOD RIGHT FOREARM  Result Value Ref Range Status   Specimen Description BLOOD RIGHT FOREARM  Final   Special Requests   Final    BOTTLES DRAWN AEROBIC AND ANAEROBIC Blood Culture results may not be  optimal due to an excessive volume of blood received in culture bottles   Culture   Final    NO GROWTH 2 DAYS Performed at [facility], [place] [address], [place],  [address]    Report Status PENDING  Incomplete    Studies/Results: DG Chest 2 View  Result Date: 03/16/2020 CLINICAL DATA:  Chronic pain syndrome.  Sickle cell disease. EXAM: CHEST - 2 VIEW COMPARISON:  03/13/2020 FINDINGS: The heart size and mediastinal contours are within normal limits. Both lungs are clear. Chronic osteosclerosis again seen, consistent with history of sickle cell disease. IMPRESSION: No active cardiopulmonary disease. Electronically Signed   By: Marlaine Hind M.D.   On: 03/16/2020 19:56    Medications: Scheduled Meds: . cholecalciferol  2,000 Units Oral Daily  . feeding supplement  1 Bottle Oral TID BM  . folic acid  1 mg Oral Daily  . gabapentin  300 mg Oral  TID  . HYDROmorphone   Intravenous Q4H  . oxyCODONE  20 mg Oral Q12H  . potassium chloride SA  40 mEq Oral Daily  . rivaroxaban  20 mg Oral Q supper  . senna-docusate  2 tablet Oral BID   Continuous Infusions: . sodium chloride 10 mL/hr at 03/15/20 0537  . cefTRIAXone (ROCEPHIN)  IV 200 mL/hr at 03/17/20 0325  . dextrose 5 % and 0.45% NaCl 10 mL/hr at 03/16/20 0906  . diphenhydrAMINE     PRN Meds:.sodium chloride, acetaminophen, diphenhydrAMINE **OR** diphenhydrAMINE, naloxone **AND** sodium chloride flush, ondansetron (ZOFRAN) IV, oxyCODONE-acetaminophen **AND** oxyCODONE  Consultants:  None  Procedures:  None  Antibiotics: Completed IV azithromycin IV ceftriaxone  Assessment/Plan: Principal Problem:   Sickle cell anemia with crisis ([place]) Active Problems:   Hypokalemia   Leukocytosis   Sickle cell crisis (HCC)   Fever of unknown origin   Other chronic pain   Socially inappropriate behavior  Sickle cell disease with pain crisis: Continue IV fluids at [facility].  Also, continue IV Dilaudid PCA without any changes in  settings today IV Toradol 15 mg every 6 hours for total of 5 days Continue home medications Monitor vital signs very closely, reevaluate pain scale regularly, and supplemental oxygen as needed  Fever of unknown origin: Patient continues to be periodically febrile.  Chest x-ray shows no acute cardiopulmonary process.  Blood cultures negative.  Urine culture negative.  We will continue IV antibiotics.  Tylenol 650 mg every 4 hours as needed for fever.  Hypokalemia:  Continue to replete potassium.  Follow labs in AM.  Leukocytosis: Stable.  Patient periodically febrile.  Continue IV antibiotics.  Follow labs in AM.  Sickle cell anemia: Hemoglobin 7.5 today. Patient is s/p 2 units of PRBCs on 03/16/2020. No further transfusions warranted at this time. Continue to monitor closely. CBC in am.   Chronic pain syndrome: Continue home medications  History of DVT:  Continue Xarelto. Follow closely.   Aggressive behavior:  Patient has a history of aggressive behavior when hospitalized. Continue to counsel as needed.  Code Status: Full Code Family Communication: N/A Disposition Plan: Not yet ready for discharge  [place], MSN, FNP-C Patient [place] [address] [place], [place] [address] [phone]   If 5PM-8AM, please contact night-coverage.  03/17/2020, 8:57 AM  LOS: 3 days

## 2020-03-18 DIAGNOSIS — D5700 Hb-SS disease with crisis, unspecified: Secondary | ICD-10-CM | POA: Diagnosis not present

## 2020-03-18 LAB — CBC
HCT: 21.6 % — ABNORMAL LOW (ref 39.0–52.0)
Hemoglobin: 7.3 g/dL — ABNORMAL LOW (ref 13.0–17.0)
MCH: 30.2 pg (ref 26.0–34.0)
MCHC: 33.8 g/dL (ref 30.0–36.0)
MCV: 89.3 fL (ref 80.0–100.0)
Platelets: 363 10*3/uL (ref 150–400)
RBC: 2.42 MIL/uL — ABNORMAL LOW (ref 4.22–5.81)
RDW: 19.6 % — ABNORMAL HIGH (ref 11.5–15.5)
WBC: 11.1 10*3/uL — ABNORMAL HIGH (ref 4.0–10.5)
nRBC: 18.3 % — ABNORMAL HIGH (ref 0.0–0.2)

## 2020-03-18 LAB — BASIC METABOLIC PANEL
Anion gap: 10 (ref 5–15)
BUN: 5 mg/dL — ABNORMAL LOW (ref 6–20)
CO2: 28 mmol/L (ref 22–32)
Calcium: 9 mg/dL (ref 8.9–10.3)
Chloride: 99 mmol/L (ref 98–111)
Creatinine, Ser: 0.48 mg/dL — ABNORMAL LOW (ref 0.61–1.24)
GFR, Estimated: >60 mL/min (ref >60)
Glucose, Bld: 102 mg/dL — ABNORMAL HIGH (ref 70–99)
Potassium: 3.4 mmol/L — ABNORMAL LOW (ref 3.5–5.1)
Sodium: 137 mmol/L (ref 135–145)

## 2020-03-18 MED ORDER — HYDROMORPHONE 1 MG/ML IV SOLN
INTRAVENOUS | Status: DC
  Administered 2020-03-18: 6 mg via INTRAVENOUS
  Administered 2020-03-18: 4.5 mg via INTRAVENOUS
  Administered 2020-03-18: 30 mg via INTRAVENOUS
  Administered 2020-03-19: 6 mg via INTRAVENOUS
  Administered 2020-03-19: 4 mg via INTRAVENOUS
  Administered 2020-03-19 (×2): 7.5 mg via INTRAVENOUS
  Filled 2020-03-18: qty 30

## 2020-03-18 MED ORDER — OXYCODONE HCL 5 MG PO TABS
5.0000 mg | ORAL_TABLET | ORAL | Status: DC | PRN
Start: 2020-03-18 — End: 2020-03-19

## 2020-03-18 MED ORDER — OXYCODONE-ACETAMINOPHEN 5-325 MG PO TABS: 1.0000 | ORAL_TABLET | ORAL | Status: DC | PRN

## 2020-03-18 NOTE — Progress Notes (Signed)
Pharmacy IV to PO conversion  The patient is ordered Diphenhydramine by the intravenous route with a linked PO option available.  Based on criteria approved by the Pharmacy and [place], the IV option is being discontinued.   Not prescribed to treat or prevent a severe allergic reaction   Not prescribed as premedication prior to receiving blood product, biologic medication, antimicrobial, or chemotherapy agent   The patient has tolerated at least one dose of an oral or enteral medication   The patient has no evidence of active gastrointestinal bleeding or impaired GI absorption (gastrectomy, short bowel, patient on TNA or NPO).   The patient is not undergoing procedural sedation  If you have any questions about this conversion, please contact the Pharmacy Department (ext [phone]).  Thank you.  Reuel Boom, PharmD, BCPS [phone] 03/18/2020, 10:52 AM

## 2020-03-18 NOTE — Progress Notes (Signed)
Patient started out the night being verbally aggressive.  Cursing at staff multiple times.  When I went in to fix the patient's leads that he took off, he swiped my hand down when trying to grab the box off the bed. Supervisor was called and spoke with patient about not disrespecting staff, including cursing at them. Patient verbalized understanding and behavior towards staff improved over the evening.   Approximately [phone] patient pressed call light for nurse. When I went in to talk to patient, he asked me to give him his white bag with his belongings. He began to dress himself and say "my homeboy gave me the heads up. They're coming up here and it's about to go down." When I asked the patient what he meant he said "It's about to go down. I don't know what's going to happen, but they're coming." I asked the patient if someone was threatening to hurt him and he said "can't nobody hurt me. They can try but can't nobody hurt me." I asked the patient if they were planning to shoot him or something like that and he shrugged his shoulders and said "they can try. Can't nobody hurt me."   Nursing supervisor was called. I was instructed to call security. Security stated they'd be up to talk to patient. Supervisor  came and spoke with patient. Asked patient if he wanted to be made confidential so anyone that calls or tries to visit won't know he is here. Patient agreed to this.  Security came up and talked to patient. After many questions, patient couldn't figure out if they incident actually happened or not. He wasn't sure if he was dreaming about the phone call but he said it feels like it was real.   Patient has been heard and seen throughout night yelling, arguing and talking to himself and to his phone. Patient remains oriented x4 but is clearly having auditory and visual hallucinations. I asked the patient if he experiences these episodes at home and he said he doesn't. Assisted patient in ambulating the unit to  "get out of his room and clear his head." Patient back in room and currently yelling curse words to himself and carrying a conversation with himself.

## 2020-03-18 NOTE — Progress Notes (Signed)
Subjective:  Timothy Marks is a 30 year old male with a medical history significant for sickle cell disease, chronic pain syndrome, opiate dependence and tolerance, history of anemia of chronic disease, and history of oppositional defiance was admitted for sickle cell pain crisis. Patient continues to have pain primarily to upper and lower extremities, and low back.  Pain intensity is 6/10.  Patient denies any headache, shortness of breath, dizziness, urinary symptoms, nausea, vomiting, or diarrhea. Reports of auditory and visual hallucinations by overnight nursing staff noted.  Psychiatry consulted.  At this time patient stable.  He denies any suicidal or homicidal intentions.  He also denies current visual or auditory hallucination.  Patient states that "I believe I was dreaming".   Objective:  Vital signs in last 24 hours:  Vitals:   03/18/20 0826 03/18/20 1020 03/18/20 1230 03/18/20 1517  BP:  111/68    Pulse:  84    Resp: 15 14 13 13   Temp:  98.6 F (37 C)    TempSrc:  Oral    SpO2: 100% 99% 99%     Intake/Output from previous day:   Intake/Output Summary (Last 24 hours) at 03/18/2020 1522 Last data filed at 03/18/2020 1500 Gross per 24 hour  Intake 1200 ml  Output 1000 ml  Net 200 ml    Physical Exam: General: Alert, awake, oriented x3, in no acute distress.  HEENT: [place]/AT PEERL, EOMI Neck: Trachea midline,  no masses, no thyromegal,y no JVD, no carotid bruit OROPHARYNX:  Moist, No exudate/ erythema/lesions.  Heart: Regular rate and rhythm, without murmurs, rubs, gallops, PMI non-displaced, no heaves or thrills on palpation.  Lungs: Clear to auscultation, no wheezing or rhonchi noted. No increased vocal fremitus resonant to percussion  Abdomen: Soft, nontender, nondistended, positive bowel sounds, no masses no hepatosplenomegaly noted..  Neuro: No focal neurological deficits noted cranial nerves II through XII grossly intact. DTRs 2+ bilaterally upper and lower extremities.  Strength 5 out of 5 in bilateral upper and lower extremities. Musculoskeletal: No warm swelling or erythema around joints, no spinal tenderness noted. Psychiatric: Patient alert and oriented x3, good insight and cognition, good recent to remote recall. Lymph node survey: No cervical axillary or inguinal lymphadenopathy noted.  Lab Results:  Basic Metabolic Panel:    Component Value Date/Time   NA 137 03/18/2020 1037   NA 139 08/31/2018 0854   K 3.4 (L) 03/18/2020 1037   CL 99 03/18/2020 1037   CO2 28 03/18/2020 1037   BUN 5 (L) 03/18/2020 1037   BUN 6 08/31/2018 0854   CREATININE 0.48 (L) 03/18/2020 1037   GLUCOSE 102 (H) 03/18/2020 1037   CALCIUM 9.0 03/18/2020 1037   CBC:    Component Value Date/Time   WBC 11.1 (H) 03/18/2020 1037   HGB 7.3 (L) 03/18/2020 1037   HGB 8.0 (L) 11/13/2018 1408   HCT 21.6 (L) 03/18/2020 1037   HCT 25.1 (L) 11/13/2018 1408   PLT 363 03/18/2020 1037   PLT 792 (H) 11/13/2018 1408   MCV 89.3 03/18/2020 1037   MCV 95 11/13/2018 1408   NEUTROABS 12.6 (H) 03/16/2020 0710   NEUTROABS 5.5 11/13/2018 1408   LYMPHSABS 2.8 03/16/2020 0710   LYMPHSABS 2.9 11/13/2018 1408   MONOABS 1.5 (H) 03/16/2020 0710   EOSABS 0.0 03/16/2020 0710   EOSABS 0.0 11/13/2018 1408   BASOSABS 0.0 03/16/2020 0710   BASOSABS 0.0 11/13/2018 1408    Recent Results (from the past 240 hour(s))  SARS CORONAVIRUS 2 (TAT 6-24 HRS) Nasopharyngeal Nasopharyngeal Swab  Status: None   Collection Time: 03/14/20  4:40 AM   Specimen: Nasopharyngeal Swab  Result Value Ref Range Status   SARS Coronavirus 2 NEGATIVE NEGATIVE Final    Comment: (NOTE) SARS-CoV-2 target nucleic acids are NOT DETECTED.  The SARS-CoV-2 RNA is generally detectable in upper and lower respiratory specimens during the acute phase of infection. Negative results do not preclude SARS-CoV-2 infection, do not rule out co-infections with other pathogens, and should not be used as the sole basis for treatment  or other patient management decisions. Negative results must be combined with clinical observations, patient history, and epidemiological information. The expected result is Negative.  Fact Sheet for Patients: SugarRoll.be  Fact Sheet for Healthcare Providers: https://www.woods-mathews.com/  This test is not yet approved or cleared by the [place] FDA and  has been authorized for detection and/or diagnosis of SARS-CoV-2 by FDA under an Emergency Use Authorization (EUA). This EUA will remain  in effect (meaning this test can be used) for the duration of the COVID-19 declaration under Se ction 564(b)(1) of the Act, 21 U.S.C. section 360bbb-3(b)(1), unless the authorization is terminated or revoked sooner.  Performed at [facility], [place] [address], [place], [place] [address]   Culture, blood (routine x 2)     Status: None (Preliminary result)   Collection Time: 03/14/20 10:25 AM   Specimen: BLOOD RIGHT HAND  Result Value Ref Range Status   Specimen Description BLOOD RIGHT HAND  Final   Special Requests   Final    BOTTLES DRAWN AEROBIC AND ANAEROBIC Blood Culture results may not be optimal due to an excessive volume of blood received in culture bottles   Culture   Final    NO GROWTH 4 DAYS Performed at [facility], [place] [address], [place], [place] [address]    Report Status PENDING  Incomplete  Culture, Urine     Status: None   Collection Time: 03/14/20 11:11 AM   Specimen: Urine, Random  Result Value Ref Range Status   Specimen Description URINE, RANDOM  Final   Special Requests NONE  Final   Culture   Final    NO GROWTH Performed at [facility], [place] [address] [place], [place] [address]    Report Status 03/15/2020 FINAL  Final  Culture, blood (routine x 2)     Status: None (Preliminary result)   Collection Time: 03/14/20  2:40 PM   Specimen: BLOOD RIGHT FOREARM  Result Value Ref Range Status   Specimen  Description BLOOD RIGHT FOREARM  Final   Special Requests   Final    BOTTLES DRAWN AEROBIC AND ANAEROBIC Blood Culture results may not be optimal due to an excessive volume of blood received in culture bottles   Culture   Final    NO GROWTH 4 DAYS Performed at [facility], [place] [address], [place], [place] [address]    Report Status PENDING  Incomplete    Studies/Results: DG Chest 2 View  Result Date: 03/16/2020 CLINICAL DATA:  Chronic pain syndrome.  Sickle cell disease. EXAM: CHEST - 2 VIEW COMPARISON:  03/13/2020 FINDINGS: The heart size and mediastinal contours are within normal limits. Both lungs are clear. Chronic osteosclerosis again seen, consistent with history of sickle cell disease. IMPRESSION: No active cardiopulmonary disease. Electronically Signed   By: Marlaine Hind M.D.   On: 03/16/2020 19:56    Medications: Scheduled Meds: . cholecalciferol  2,000 Units Oral Daily  . feeding supplement  1 Bottle  Oral TID BM  . folic acid  1 mg Oral Daily  . gabapentin  300 mg Oral TID  . HYDROmorphone   Intravenous Q4H  . oxyCODONE  20 mg Oral Q12H  . potassium chloride SA  40 mEq Oral Daily  . rivaroxaban  20 mg Oral Q supper  . senna-docusate  2 tablet Oral BID   Continuous Infusions: . sodium chloride 10 mL/hr at 03/15/20 0537  . dextrose 5 % and 0.45% NaCl 10 mL/hr at 03/16/20 0906   PRN Meds:.sodium chloride, acetaminophen, diphenhydrAMINE **OR** [DISCONTINUED] diphenhydrAMINE, naloxone **AND** sodium chloride flush, ondansetron (ZOFRAN) IV, oxyCODONE-acetaminophen **AND** oxyCODONE  Consultants:  Psychiatry  Procedures:  None  Antibiotics:  Completed  Assessment/Plan: Principal Problem:   Sickle cell anemia with crisis ([place]) Active Problems:   Hypokalemia   Leukocytosis   Sickle cell crisis (HCC)   Fever of unknown origin   Other chronic pain   Socially inappropriate behavior  Sickle cell disease with pain crisis: Weaning IV Dilaudid PCA IV  Toradol 15 mg every 6 hours for total of 5 days Continue home pain medications Monitor vital signs very closely, reevaluate pain scale regularly, and supplemental oxygen as needed.  Fever of unknown origin: Resolved.  Patient afebrile overnight.  Chest x-ray shows no acute cardiopulmonary process.  Blood culture negative.  IV antibiotics discontinued.  Continue to follow closely  Hypokalemia: Resolved.  Follow labs closely  Leukocytosis: Improving.  Patient afebrile.  IV antibiotics discontinued.  Follow CBC in a.m.  Sickle cell anemia: Today, hemoglobin is 7.3.  Patient is s/p 2 units PRBCs on 03/16/2020.  No further transfusions warranted at this time.  Continue to monitor patient closely.  Chronic pain syndrome: Continue home medications  History of DVT: Continue Xarelto.  Follow closely.  Aggressive behavior: Patient has had aggressive behavior toward staff.  He continues to use inappropriate language and interfere with plan of care.  Patient often refuses labs and vital signs.  Patient counseled at length.  Discussed the importance of following plan of care in order to achieve a positive outcome.  Patient expressed understanding.  Also, psychiatry consulted for reported visual and auditory hallucinations.  It was found that hallucinations were intermittent and he does not appear to be a danger to himself and no antipsychotic medications were started.  Patient psychiatrically cleared.  Continue to follow closely.  Code Status: Full Code Family Communication: N/A Disposition Plan: Not yet ready for discharge  [place], MSN, FNP-C Patient [place] [address] [place], [place] [address] [phone]  If 5PM-8AM, please contact night-coverage.  03/18/2020, 3:22 PM  LOS: 4 days

## 2020-03-18 NOTE — Consult Note (Signed)
[facility] Consult   Reason for Consult:  Hallucinations Referring Physician:   Patient Identification: Timothy Marks MRN:  [id number] Principal Diagnosis: Sickle cell anemia with crisis [facility]) Diagnosis:  Principal Problem:   Sickle cell anemia with crisis ([place]) Active Problems:   Hypokalemia   Leukocytosis   Sickle cell crisis (HCC)   Fever of unknown origin   Other chronic pain   Socially inappropriate behavior   Total Time spent with patient: 30 minutes  Subjective:   Timothy Marks is a 30 y.o. male patient admitted with crisis.  Psychiatric consult was placed for auditory and visual hallucinations.  Patient is seen and case discussed with Dr. Dwyane Dee. Patient is able to answer all questions and provides detailed information about the events that occurred last night. He states he was asleeping and he woke up to "Its about to be Over [name]." He reports after that he alerted the nurse that he was going to leave, and began getting ready. He states "the staff was able to talk to me and help me calm down.  I dont know if I was dreaming or if I really got a phone call. I just woke up and had the phone in my hand. I went for a walk up and down the hallway and felt better. I came back and drank my hot chocolate. " Patient denies any previous psychiatric history. He denies any previous or current hallucinations, delusional thinking, or paranoia. He does not appear to be responding to internal stimuli. He denies any suicidal ideations, homicidal ideations and or hallucinations.    On evaluation patient is  Patient is alert and oriented x 3, calm and cooperative. Upon entering the room patient was heard speaking to someone, and had his phone in his hand. He denies being on the phone with anyone, and no one is present in the room. Throughout the evaluation patient is grooming himself to including combing of his beard, mustache, and nasal hairs. At one point he used his camera on his  phone to look into his nose and assess his skin on his face. He appears to have normal thought processes. His mood is euthymic and he engages well with Probation officer. He states he is unaware if this was a dream or reality. He denies any history of previous psychosis or hallucinations. He denies suicidal ideation and feels as though he can contract for safety while in the hospital.    HPI:   Timothy Marks is a 30 y.o. male with medical history significant for sickle cell disease, chronic pain syndrome, opiate dependence and tolerance, left lower extremity DVT, COVID-19 viral infection on 01/22/2020, who presented to [facility] ED with complaints of lower back pain and bilateral upper and lower extremities pain.  Pain is similar to prior sickle cell pain crisis.  Associated with severe pain in all of his joints.  He denies any chest pain or dyspnea.  Denies any fevers or GI symptoms.  He presented to the ED for further evaluation and management.  Pain not controlled in the ED.  EDP requested admission for pain control.  TRH was asked to admit.  Patient [facility] to [place] with remote telemetry as observation status.  Past Psychiatric History: Patient denies history chart review indicates history of oppositional defiant disorder.  Patient denies any previous psychotropic medication.  He denies any previous psychiatric admissions.  He denies any current or previous outpatient.  He denies any legal charges.  He also denies substance abuse,  although urine toxicology is positive THC.  Risk to Self:   Denie Risk to Others:   Denies Prior Inpatient Therapy:  Denies Prior Outpatient Therapy:   Denies   Past Medical History:  Past Medical History:  Diagnosis Date  . Depression   . Generalized weakness 03/31/2019  . 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 Sickle cell anemia (HCC)   . Vitamin D deficiency y-6    Past Surgical History:   Procedure Laterality Date  . CHOLECYSTECTOMY     Family History:  Family History  Problem Relation Age of Onset  . Sickle cell trait Mother   . Sickle cell trait Father    Family Psychiatric  History: Denies Social History:  Social History   Substance and Sexual Activity  Alcohol Use No     Social History   Substance and Sexual Activity  Drug Use Not Currently  . Types: Marijuana    Social History   Socioeconomic History  . Marital status: Single    Spouse name: Not on file  . Number of children: Not on file  . Years of education: Not on file  . Highest education level: Not on file  Occupational History  . Not on file  Tobacco Use  . Smoking status: Never Smoker  . Smokeless tobacco: Never Used  Vaping Use  . Vaping Use: Never used  Substance and Sexual Activity  . Alcohol use: No  . Drug use: Not Currently    Types: Marijuana  . Sexual activity: Not on file  Other Topics Concern  . Not on file  Social History Narrative  . Not on file   Social Determinants of Health   Financial Resource Strain: Not on file  Food Insecurity: Not on file  Transportation Needs: Not on file  Physical Activity: Not on file  Stress: Not on file  Social Connections: Not on file   Additional Social History:    Allergies:   Allergies  Allergen Reactions  . Buprenorphine Hcl Hives  . Levaquin [Levofloxacin] Itching  . Meperidine Rash  . Morphine Hives  . Morphine And Related Hives  . Toradol [Ketorolac Tromethamine] Itching  . Tramadol Hives  . Vancomycin Itching  . Zosyn [Piperacillin Sod-Tazobactam So] Itching, Rash and Other (See Comments)    Has taken rocephin in the past    Labs:  Results for orders placed or performed during the hospital encounter of 03/13/20 (from the past 48 hour(s))  Hemoglobin and hematocrit, blood     Status: Abnormal   Collection Time: 03/17/20  6:12 AM  Result Value Ref Range   Hemoglobin 7.5 (L) 13.0 - 17.0 g/dL    Comment:  REPEATED TO VERIFY POST TRANSFUSION SPECIMEN DELTA CHECK NOTED    HCT 21.5 (L) 39.0 - 52.0 %    Comment: Performed at [facility], [place] [address], [place], [place] [address]    Current Facility-Administered Medications  Medication Dose Route Frequency Provider Last Rate Last Admin  . 0.9 %  sodium chloride infusion   Intravenous PRN Elwyn Reach, MD 10 mL/hr at 03/15/20 0537 New Bag at 03/15/20 0537  . acetaminophen (TYLENOL) tablet 650 mg  650 mg Oral Q6H PRN Martyn Malay, MD   650 mg at 03/16/20 2348  . cholecalciferol (VITAMIN D3) tablet 2,000 Units  2,000 Units Oral Daily Kayleen Memos, DO   2,000 Units at 03/18/20 5093  . dextrose 5 %-0.45 %  sodium chloride infusion   Intravenous Continuous Dorena Dew, FNP 10 mL/hr at 03/16/20 6468 Rate Change at 03/16/20 0906  . diphenhydrAMINE (BENADRYL) capsule 25 mg  25 mg Oral Q4H PRN Irene Pap N, DO   25 mg at 03/16/20 1828   Or  . diphenhydrAMINE (BENADRYL) 25 mg in sodium chloride 0.9 % 50 mL IVPB  25 mg Intravenous Q4H PRN Irene Pap N, DO      . feeding supplement (ENSURE ENLIVE / ENSURE PLUS) liquid 237 mL  1 Bottle Oral TID BM Hall, Carole N, DO   237 mL at 03/17/20 1056  . folic acid (FOLVITE) tablet 1 mg  1 mg Oral Daily Annona, Archie Patten N, DO   1 mg at 03/18/20 0321  . gabapentin (NEURONTIN) capsule 300 mg  300 mg Oral TID Irene Pap N, DO   300 mg at 03/18/20 0827  . HYDROmorphone (DILAUDID) 1 mg/mL PCA injection   Intravenous Q4H Gala Romney L, MD   30 mg at 03/18/20 0818  . naloxone [facility]) injection 0.4 mg  0.4 mg Intravenous PRN Irene Pap N, DO       And  . sodium chloride flush (NS) 0.9 % injection 9 mL  9 mL Intravenous PRN Irene Pap N, DO      . ondansetron (ZOFRAN) injection 4 mg  4 mg Intravenous Q6H PRN Irene Pap N, DO      . oxyCODONE-acetaminophen (PERCOCET/ROXICET) 5-325 MG per tablet 1 tablet  1 tablet Oral Q6H PRN Irene Pap N, DO   1 tablet at 03/17/20 1641   And  .  oxyCODONE (Oxy IR/ROXICODONE) immediate release tablet 5 mg  5 mg Oral Q6H PRN Irene Pap N, DO   5 mg at 03/17/20 1641  . oxyCODONE (OXYCONTIN) 12 hr tablet 20 mg  20 mg Oral Q12H Hall, Carole N, DO   20 mg at 03/18/20 2248  . potassium chloride SA (KLOR-CON) CR tablet 40 mEq  40 mEq Oral Daily Dorena Dew, FNP   40 mEq at 03/18/20 0827  . rivaroxaban (XARELTO) tablet 20 mg  20 mg Oral Q supper Irene Pap N, DO   20 mg at 03/17/20 1641  . senna-docusate (Senokot-S) tablet 2 tablet  2 tablet Oral BID Kayleen Memos, DO   2 tablet at 03/18/20 0827    Musculoskeletal: Strength & Muscle Tone: within normal limits Gait & Station: normal Patient leans: N/A  Psychiatric Specialty Exam: Physical Exam  Review of Systems  Blood pressure 117/81, pulse 98, temperature 98.8 F (37.1 C), temperature source Oral, resp. rate 17, SpO2 97 %.There is no height or weight on file to calculate BMI.  General Appearance: Fairly Groomed  Eye Contact:  Fair  Speech:  Clear and Coherent and Normal Rate  Volume:  Normal  Mood:  Euthymic  Affect:  Appropriate and Congruent  Thought Process:  Coherent, Linear and Descriptions of Associations: Intact  Orientation:  Full (Time, Place, and Person)  Thought Content:  Logical  Suicidal Thoughts:  No  Homicidal Thoughts:  No  Memory:  Immediate;   Fair Recent;   Fair  Judgement:  Fair  Insight:  Fair  Psychomotor Activity:  Normal  Concentration:  Concentration: Fair and Attention Span: Fair  Recall:  [employer] of Knowledge:  Fair  Language:  Fair  Akathisia:  No  Handed:  Right  AIMS (if indicated):     Assets:  Communication Skills Desire for Improvement Financial Resources/Insurance Leisure Time Physical Health  Social Support  ADL's:  Intact  Cognition:  WNL  Sleep:        Treatment Plan Summary: Hypnopompic Hallucinations cause is unclear at this time, however would suggest this is likely due to his THC use (withdrawal) or infection.  Patient will require frequent orientation and redirecting upon awakening. Overall he is safe while in the hospital. At this time hallucinations are intermittent and he does not appear to be a danger to himself, to initiate antipsychotic medications at this time. Will order EKG in case initiation is required.   Leukocytes in urine- may warrant treatment as patient is exhibiting some psychiatric symptoms to include hallucinations.   Plan Psychiatrically cleared.   Disposition: No evidence of imminent risk to self or others at present.   Patient does not meet criteria for psychiatric inpatient admission.  Suella Broad, FNP 03/18/2020 10:00 AM

## 2020-03-19 DIAGNOSIS — D5700 Hb-SS disease with crisis, unspecified: Secondary | ICD-10-CM | POA: Diagnosis not present

## 2020-03-19 LAB — CULTURE, BLOOD (ROUTINE X 2)
Culture: NO GROWTH
Culture: NO GROWTH

## 2020-03-19 NOTE — Discharge Summary (Signed)
Physician Discharge Summary  Timothy Marks FIE:[id number] DOB: 02/04/1991 DOA: 03/13/2020  PCP: Tresa Garter, MD  Admit date: 03/13/2020  Discharge date: 03/19/2020  Discharge Diagnoses:  Principal Problem:   Sickle cell anemia with crisis [facility]) Active Problems:   Hypokalemia   Leukocytosis   Sickle cell crisis (HCC)   Fever of unknown origin   Other chronic pain   Socially inappropriate behavior   Discharge Condition: Stable  Disposition:  Pt is discharged home in good condition and is to follow up with Tresa Garter, MD this week to have labs evaluated. Timothy Marks is instructed to increase activity slowly and balance with rest for the next few days, and use prescribed medication to complete treatment of pain  Diet: Regular Wt Readings from Last 3 Encounters:  02/27/20 52.4 kg  01/22/20 54.1 kg  01/16/20 54.1 kg    History of present illness:  Timothy Marks is a 30 year old male with a medical history significant for sickle cell disease, chronic pain syndrome, opiate dependence and tolerance, left lower extremity DVT, COVID-19 viral infection on 01/22/2020 who presented to [facility] emergency department with complaints of low back pain and bilateral upper and lower extremity pain.  Pain is similar to prior sickle cell pain crisis.  Associated with severe pain in all of his joints.  He denies any chest pain or dyspnea.  Denies any fever or GI symptoms.  He presented to the ER for further evaluation and management.  Pain is not controlled in the emergency department.  EDP requested admission for pain control.  TRH was asked to admit.  Patient admitted at [facility] to [place] with remote telemetry as observation status.  ER course: Maximum temperature 99.7, heart rate 126, respiratory rate 38.  Lab studies remarkable for alkaline phosphatase 140, AST 53, total bilirubin 6.7, WBCs 18.8K, and hemoglobin 8.3.  Hospital Course:  Sickle cell disease with  pain crisis: Patient was admitted for sickle cell pain crisis and managed appropriately with IVF, IV Dilaudid via PCA and IV Toradol, as well as other adjunct therapies per sickle cell pain management protocols.  IV Dilaudid PCA weaned appropriately.  Patient transition to home medications.  OxyContin was continued throughout admission without interruption.  Also, patient transition to Percocet 10-325 mg every 4 hours as needed.  Pain intensity is 6/10.  Patient will resume home medications upon discharge.  Patient will resume home medications.  He will follow-up with PCP as scheduled for medication management.  During admission, patient periodically febrile, suspected to be related to vaso-occlusive crises.  IV antibiotics were initiated.  No source identified.  Chest x-ray showed no acute cardiopulmonary process.  Blood cultures negative throughout admission.  Patient negative for COVID-19 infection.  Patient is afebrile and oxygen saturation 100% on RA prior to discharge.  Sickle cell anemia: Hemoglobin decreased to 5.4, which is below patient's baseline of 7-8 g/dL.  Patient was transfused 2 units PRBCs.  Hemoglobin 7.3 prior to discharge.  Patient will follow-up in clinic in 1 week to repeat CBC with differential and CMP.  History of DVT: Xarelto was continued throughout admission without interruption.  Patient advised to resume medication.  Aggressive behavior: Patient continues to have aggressive behavior towards staff during admission.  Patient often refused labs and vital signs.  Patient counseled at length about interfering with plan of care.  Also during admission there were reports of visual and auditory hallucinations.  Patient underwent psychiatric consult and was psychiatrically cleared.  No new medications added  at that time.  Patient may warrant referral to psychiatry as an outpatient.   Patient was therefore discharged home today in a hemodynamically stable condition.   Timothy Marks will  follow-up with PCP within 1 week of this discharge. Timothy Marks was counseled extensively about nonpharmacologic means of pain management, patient verbalized understanding and was appreciative of  the care received during this admission.   We discussed the need for good hydration, monitoring of hydration status, avoidance of heat, cold, stress, and infection triggers. We discussed the need to be adherent with taking Hydrea and other home medications. Patient was reminded of the need to seek medical attention immediately if any symptom of bleeding, anemia, or infection occurs.  Discharge Exam: Vitals:   03/19/20 0749 03/19/20 1146  BP:    Pulse:    Resp: 18 19  Temp:    SpO2: 99% 99%   Vitals:   03/18/20 2001 03/19/20 0404 03/19/20 0749 03/19/20 1146  BP: 120/65 129/71    Pulse: 84 95    Resp: 16 15 18 19   Temp: 98.1 F (36.7 C) 98.1 F (36.7 C)    TempSrc: Oral Oral    SpO2: 100% 99% 99% 99%    Physical Exam Constitutional:      Appearance: Normal appearance.  Eyes:     Conjunctiva/sclera: Conjunctivae normal.  Cardiovascular:     Rate and Rhythm: Normal rate and regular rhythm.     Pulses: Normal pulses.  Pulmonary:     Effort: Pulmonary effort is normal.  Abdominal:     General: Abdomen is flat. Bowel sounds are normal.  Musculoskeletal:        General: Normal range of motion.  Skin:    General: Skin is warm.  Neurological:     General: No focal deficit present.     Mental Status: He is alert. Mental status is at baseline.  Psychiatric:        Mood and Affect: Affect is angry and inappropriate.        Behavior: Behavior is agitated, aggressive and combative.        Thought Content: Thought content does not include homicidal or suicidal ideation.        Judgment: Judgment is inappropriate.     Discharge Instructions  Discharge Instructions    Discharge patient   Complete by: As directed    Discharge disposition: 01-Home or Self Care   Discharge patient date:  03/19/2020     Allergies as of 03/19/2020      Reactions   Buprenorphine Hcl Hives   Levaquin [levofloxacin] Itching   Meperidine Rash   Morphine Hives   Morphine And Related Hives   Toradol [ketorolac Tromethamine] Itching   Tramadol Hives   Vancomycin Itching   Zosyn [piperacillin Sod-tazobactam So] Itching, Rash, Other (See Comments)   Has taken rocephin in the past      Medication List    TAKE these medications   cholecalciferol 25 MCG (1000 UNIT) tablet Commonly known as: VITAMIN D Take 2 tablets (2,000 Units total) by mouth daily.   Ensure Active High Protein Liqd Take 1 each by mouth 3 (three) times daily between meals. #90 cans of Ensure What changed:   when to take this  additional instructions   folic acid 1 MG tablet Commonly known as: FOLVITE Take 1 tablet (1 mg total) by mouth daily.   gabapentin 300 MG capsule Commonly known as: NEURONTIN Take 1 capsule (300 mg total) by mouth 3 (three) times daily.  hydroxyurea 500 MG capsule Commonly known as: HYDREA Take 1 capsule (500 mg total) by mouth 2 (two) times daily. May take with food to minimize GI side effects. What changed: additional instructions   hydrOXYzine 10 MG tablet Commonly known as: ATARAX/VISTARIL Take 1 tablet (10 mg total) by mouth 3 (three) times daily as needed for itching.   oxyCODONE-acetaminophen 10-325 MG tablet Commonly known as: Percocet Take 1 tablet by mouth every 6 (six) hours as needed for up to 15 days for pain.   OxyCONTIN 20 mg 12 hr tablet Generic drug: oxyCODONE Take 1 tablet (20 mg total) by mouth every 12 (twelve) hours.   rivaroxaban 20 MG Tabs tablet Commonly known as: XARELTO Take 1 tablet (20 mg total) by mouth daily with supper.       The results of significant diagnostics from this hospitalization (including imaging, microbiology, ancillary and laboratory) are listed below for reference.    Significant Diagnostic Studies: DG Chest 2 View  Result Date:  03/16/2020 CLINICAL DATA:  Chronic pain syndrome.  Sickle cell disease. EXAM: CHEST - 2 VIEW COMPARISON:  03/13/2020 FINDINGS: The heart size and mediastinal contours are within normal limits. Both lungs are clear. Chronic osteosclerosis again seen, consistent with history of sickle cell disease. IMPRESSION: No active cardiopulmonary disease. Electronically Signed   By: Marlaine Hind M.D.   On: 03/16/2020 19:56   DG Chest Portable 1 View  Result Date: 03/13/2020 CLINICAL DATA:  Sickle cell pain EXAM: PORTABLE CHEST 1 VIEW COMPARISON:  12/19/2019 FINDINGS: Linear densities in the lung bases, likely atelectasis. Heart is normal size. No effusions. No acute bony abnormality. IMPRESSION: Bibasilar atelectasis.  No acute cardiopulmonary disease. Electronically Signed   By: Rolm Baptise M.D.   On: 03/13/2020 22:17    Microbiology: Recent Results (from the past 240 hour(s))  SARS CORONAVIRUS 2 (TAT 6-24 HRS) Nasopharyngeal Nasopharyngeal Swab     Status: None   Collection Time: 03/14/20  4:40 AM   Specimen: Nasopharyngeal Swab  Result Value Ref Range Status   SARS Coronavirus 2 NEGATIVE NEGATIVE Final    Comment: (NOTE) SARS-CoV-2 target nucleic acids are NOT DETECTED.  The SARS-CoV-2 RNA is generally detectable in upper and lower respiratory specimens during the acute phase of infection. Negative results do not preclude SARS-CoV-2 infection, do not rule out co-infections with other pathogens, and should not be used as the sole basis for treatment or other patient management decisions. Negative results must be combined with clinical observations, patient history, and epidemiological information. The expected result is Negative.  Fact Sheet for Patients: SugarRoll.be  Fact Sheet for Healthcare Providers: https://www.woods-mathews.com/  This test is not yet approved or cleared by the [place] FDA and  has been authorized for detection and/or  diagnosis of SARS-CoV-2 by FDA under an Emergency Use Authorization (EUA). This EUA will remain  in effect (meaning this test can be used) for the duration of the COVID-19 declaration under Se ction 564(b)(1) of the Act, 21 U.S.C. section 360bbb-3(b)(1), unless the authorization is terminated or revoked sooner.  Performed at [facility], [place] [address], [place], [place] [address]   Culture, blood (routine x 2)     Status: None   Collection Time: 03/14/20 10:25 AM   Specimen: BLOOD RIGHT HAND  Result Value Ref Range Status   Specimen Description BLOOD RIGHT HAND  Final   Special Requests   Final    BOTTLES DRAWN AEROBIC AND ANAEROBIC Blood Culture results may not be optimal due to  an excessive volume of blood received in culture bottles   Culture   Final    NO GROWTH 5 DAYS Performed at [facility], [place] [address], [place], [place] [address]    Report Status 03/19/2020 FINAL  Final  Culture, Urine     Status: None   Collection Time: 03/14/20 11:11 AM   Specimen: Urine, Random  Result Value Ref Range Status   Specimen Description URINE, RANDOM  Final   Special Requests NONE  Final   Culture   Final    NO GROWTH Performed at [facility], [place] [address], [place], [place] [address]    Report Status 03/15/2020 FINAL  Final  Culture, blood (routine x 2)     Status: None   Collection Time: 03/14/20  2:40 PM   Specimen: BLOOD RIGHT FOREARM  Result Value Ref Range Status   Specimen Description BLOOD RIGHT FOREARM  Final   Special Requests   Final    BOTTLES DRAWN AEROBIC AND ANAEROBIC Blood Culture results may not be optimal due to an excessive volume of blood received in culture bottles   Culture   Final    NO GROWTH 5 DAYS Performed at [facility], [place] [address] [place], [place] [address]    Report Status 03/19/2020 FINAL  Final     Labs: Basic Metabolic Panel: Recent Labs  Lab 03/13/20 2155 03/14/20 0459 03/16/20 0710 03/18/20 1037  NA 136  132* 136 137  K 3.7 3.7 2.8* 3.4*  CL 102 100 100 99  CO2 23 21* 26 28  GLUCOSE 136* 108* 109* 102*  BUN 11 10 5* 5*  CREATININE 0.84 0.74 0.46* 0.48*  CALCIUM 9.1 8.5* 8.4* 9.0   Liver Function Tests: Recent Labs  Lab 03/13/20 2155 03/14/20 0459 03/16/20 0710  AST 53* 49* 56*  ALT 27 24 23   ALKPHOS 140* 136* 89  BILITOT 6.7* 6.6* 3.5*  PROT 7.4 6.8 6.8  ALBUMIN 3.9 3.6 3.7   No results for input(s): LIPASE, AMYLASE in the last 168 hours. No results for input(s): AMMONIA in the last 168 hours. CBC: Recent Labs  Lab 03/13/20 2155 03/14/20 0459 03/16/20 0710 03/17/20 0612 03/18/20 1037  WBC 18.8* 20.7* 17.0*  --  11.1*  NEUTROABS 15.1* 14.9* 12.6*  --   --   HGB 8.3* 7.6* 5.4* 7.5* 7.3*  HCT 23.8* 20.5* 16.1* 21.5* 21.6*  MCV 87.5 85.8 87.0  --  89.3  PLT 294 289 305  --  363   Cardiac Enzymes: No results for input(s): CKTOTAL, CKMB, CKMBINDEX, TROPONINI in the last 168 hours. BNP: Invalid input(s): POCBNP CBG: No results for input(s): GLUCAP in the last 168 hours.  Time coordinating discharge: 35 minutes  Signed:  [name]  APRN, MSN, FNP-C [facility] [address] [place], [place] [address] [phone]  [facility] 03/19/2020, 2:11 PM

## 2020-03-19 NOTE — Progress Notes (Signed)
Pt refuses to wear ECO2 monitor for the PCA pump.  Pt also yells out loud talking to people that aren't present in the room.   Pt appears to see or hear things that aren't there. Pt quotes, " My eyes are playing tricks on me", after he yells out.  Pt appears and sounds frustrated and mad all the time.  Pt speaks very disrespectful to staff demanding to give him things or do things for him exactly how he wants them. Notified MD.  Will continue to monitor.

## 2020-03-19 NOTE — Progress Notes (Signed)
IV not needed, per [place] H.NP.

## 2020-03-20 ENCOUNTER — Telehealth: Payer: Self-pay | Admitting: Internal Medicine

## 2020-03-21 ENCOUNTER — Other Ambulatory Visit: Payer: Self-pay

## 2020-03-21 DIAGNOSIS — G894 Chronic pain syndrome: Secondary | ICD-10-CM

## 2020-03-21 DIAGNOSIS — D571 Sickle-cell disease without crisis: Secondary | ICD-10-CM

## 2020-03-22 ENCOUNTER — Other Ambulatory Visit: Payer: Self-pay | Admitting: Internal Medicine

## 2020-03-23 ENCOUNTER — Other Ambulatory Visit: Payer: Self-pay

## 2020-03-23 ENCOUNTER — Telehealth: Payer: Self-pay | Admitting: Internal Medicine

## 2020-03-23 DIAGNOSIS — D571 Sickle-cell disease without crisis: Secondary | ICD-10-CM

## 2020-03-23 DIAGNOSIS — G894 Chronic pain syndrome: Secondary | ICD-10-CM

## 2020-03-23 NOTE — Telephone Encounter (Signed)
Patient requesting refill on Percocet. States it was due yesterday.

## 2020-03-24 ENCOUNTER — Telehealth: Payer: Self-pay | Admitting: Internal Medicine

## 2020-03-24 MED ORDER — OXYCODONE-ACETAMINOPHEN 10-325 MG PO TABS: 1.0000 | ORAL_TABLET | Freq: Four times a day (QID) | ORAL | 0 refills | Status: DC | PRN

## 2020-03-24 NOTE — Telephone Encounter (Signed)
Done

## 2020-03-24 NOTE — Telephone Encounter (Signed)
error 

## 2020-03-24 NOTE — Telephone Encounter (Signed)
Refilled

## 2020-03-26 ENCOUNTER — Telehealth: Payer: Self-pay | Admitting: Internal Medicine

## 2020-03-27 ENCOUNTER — Other Ambulatory Visit: Payer: Self-pay | Admitting: Internal Medicine

## 2020-03-27 DIAGNOSIS — Z79891 Long term (current) use of opiate analgesic: Secondary | ICD-10-CM

## 2020-03-27 DIAGNOSIS — G894 Chronic pain syndrome: Secondary | ICD-10-CM

## 2020-03-27 MED ORDER — OXYCONTIN 20 MG PO T12A
20.0000 mg | EXTENDED_RELEASE_TABLET | Freq: Two times a day (BID) | ORAL | 0 refills | Status: DC
Start: 2020-03-28 — End: 2020-04-29

## 2020-03-27 NOTE — Telephone Encounter (Signed)
Refilled

## 2020-04-03 ENCOUNTER — Telehealth: Payer: Self-pay | Admitting: Internal Medicine

## 2020-04-03 ENCOUNTER — Other Ambulatory Visit: Payer: Self-pay | Admitting: Internal Medicine

## 2020-04-05 ENCOUNTER — Other Ambulatory Visit: Payer: Self-pay | Admitting: Internal Medicine

## 2020-04-06 ENCOUNTER — Telehealth: Payer: Self-pay | Admitting: Internal Medicine

## 2020-04-06 NOTE — Telephone Encounter (Signed)
(  PERCOCET) 10-325 MG tablet   expires 04/08/20 (15 day rx).  Pt request refill to [facility] on [address]. Pt ph [phone].

## 2020-04-08 ENCOUNTER — Other Ambulatory Visit: Payer: Self-pay | Admitting: Internal Medicine

## 2020-04-08 DIAGNOSIS — D571 Sickle-cell disease without crisis: Secondary | ICD-10-CM

## 2020-04-08 DIAGNOSIS — G894 Chronic pain syndrome: Secondary | ICD-10-CM

## 2020-04-08 MED ORDER — OXYCODONE-ACETAMINOPHEN 10-325 MG PO TABS: 1.0000 | ORAL_TABLET | Freq: Four times a day (QID) | ORAL | 0 refills | Status: DC | PRN

## 2020-04-09 NOTE — Telephone Encounter (Signed)
Done

## 2020-04-17 ENCOUNTER — Telehealth: Payer: Self-pay | Admitting: Internal Medicine

## 2020-04-17 ENCOUNTER — Other Ambulatory Visit: Payer: Self-pay | Admitting: Internal Medicine

## 2020-04-17 DIAGNOSIS — Z86718 Personal history of other venous thrombosis and embolism: Secondary | ICD-10-CM

## 2020-04-17 NOTE — Telephone Encounter (Signed)
PERCOCET End Date: 04/23/20.  Please send new prescription to [facility].

## 2020-04-17 NOTE — Telephone Encounter (Signed)
Thanks. Will do on 03/10

## 2020-04-18 NOTE — Telephone Encounter (Signed)
Routing to provider- [facility] does not do refills for this practice.

## 2020-04-20 ENCOUNTER — Telehealth: Payer: Self-pay

## 2020-04-20 DIAGNOSIS — G894 Chronic pain syndrome: Secondary | ICD-10-CM

## 2020-04-20 DIAGNOSIS — D571 Sickle-cell disease without crisis: Secondary | ICD-10-CM

## 2020-04-20 DIAGNOSIS — Z79891 Long term (current) use of opiate analgesic: Secondary | ICD-10-CM

## 2020-04-20 NOTE — Telephone Encounter (Signed)
Med refill Oxcotine and Percocet .

## 2020-04-21 ENCOUNTER — Telehealth: Payer: Self-pay

## 2020-04-21 NOTE — Addendum Note (Signed)
Addended by: Amado Coe on: 04/21/2020 10:20 AM   Modules accepted: Orders

## 2020-04-21 NOTE — Telephone Encounter (Signed)
Pt ask about his refill on his med's from previous message?

## 2020-04-22 ENCOUNTER — Other Ambulatory Visit: Payer: Self-pay

## 2020-04-22 DIAGNOSIS — Z79891 Long term (current) use of opiate analgesic: Secondary | ICD-10-CM

## 2020-04-22 DIAGNOSIS — G894 Chronic pain syndrome: Secondary | ICD-10-CM

## 2020-04-22 DIAGNOSIS — D571 Sickle-cell disease without crisis: Secondary | ICD-10-CM

## 2020-04-23 ENCOUNTER — Other Ambulatory Visit: Payer: Self-pay | Admitting: Internal Medicine

## 2020-04-23 ENCOUNTER — Other Ambulatory Visit: Payer: Self-pay

## 2020-04-23 DIAGNOSIS — G894 Chronic pain syndrome: Secondary | ICD-10-CM

## 2020-04-23 DIAGNOSIS — D571 Sickle-cell disease without crisis: Secondary | ICD-10-CM

## 2020-04-23 MED ORDER — OXYCODONE-ACETAMINOPHEN 10-325 MG PO TABS: 1.0000 | ORAL_TABLET | Freq: Four times a day (QID) | ORAL | 0 refills | Status: DC | PRN

## 2020-04-27 ENCOUNTER — Ambulatory Visit: Payer: Self-pay | Admitting: Internal Medicine

## 2020-04-29 ENCOUNTER — Other Ambulatory Visit: Payer: Self-pay | Admitting: Internal Medicine

## 2020-04-29 DIAGNOSIS — G894 Chronic pain syndrome: Secondary | ICD-10-CM

## 2020-04-29 DIAGNOSIS — Z79891 Long term (current) use of opiate analgesic: Secondary | ICD-10-CM

## 2020-04-29 MED ORDER — OXYCONTIN 20 MG PO T12A: 20.0000 mg | EXTENDED_RELEASE_TABLET | Freq: Two times a day (BID) | ORAL | 0 refills | Status: DC

## 2020-05-04 ENCOUNTER — Other Ambulatory Visit: Payer: Self-pay

## 2020-05-04 ENCOUNTER — Telehealth: Payer: Self-pay

## 2020-05-04 DIAGNOSIS — G894 Chronic pain syndrome: Secondary | ICD-10-CM

## 2020-05-04 DIAGNOSIS — Z79891 Long term (current) use of opiate analgesic: Secondary | ICD-10-CM

## 2020-05-04 DIAGNOSIS — D571 Sickle-cell disease without crisis: Secondary | ICD-10-CM

## 2020-05-04 NOTE — Telephone Encounter (Signed)
Oxycodone and percocet MED refill

## 2020-05-05 ENCOUNTER — Telehealth: Payer: Self-pay | Admitting: Internal Medicine

## 2020-05-05 ENCOUNTER — Other Ambulatory Visit: Payer: Self-pay | Admitting: Internal Medicine

## 2020-05-05 DIAGNOSIS — Z79891 Long term (current) use of opiate analgesic: Secondary | ICD-10-CM

## 2020-05-05 DIAGNOSIS — G894 Chronic pain syndrome: Secondary | ICD-10-CM

## 2020-05-05 NOTE — Telephone Encounter (Signed)
Patient requests refill of Oxycodone 10-325mg  due on 05/08/20 to be sent to [facility] on [address]. Patient would also like to schedule an appointment with Dr. Doreene Burke.

## 2020-05-06 ENCOUNTER — Other Ambulatory Visit: Payer: Self-pay

## 2020-05-06 DIAGNOSIS — G894 Chronic pain syndrome: Secondary | ICD-10-CM

## 2020-05-06 DIAGNOSIS — D571 Sickle-cell disease without crisis: Secondary | ICD-10-CM

## 2020-05-07 ENCOUNTER — Telehealth: Payer: Self-pay

## 2020-05-07 ENCOUNTER — Other Ambulatory Visit: Payer: Self-pay | Admitting: Internal Medicine

## 2020-05-07 DIAGNOSIS — D571 Sickle-cell disease without crisis: Secondary | ICD-10-CM

## 2020-05-07 DIAGNOSIS — G894 Chronic pain syndrome: Secondary | ICD-10-CM

## 2020-05-07 MED ORDER — HYDROXYUREA 500 MG PO CAPS: 500.0000 mg | ORAL_CAPSULE | Freq: Two times a day (BID) | ORAL | 2 refills | Status: DC

## 2020-05-07 MED ORDER — OXYCODONE-ACETAMINOPHEN 10-325 MG PO TABS: 1.0000 | ORAL_TABLET | Freq: Four times a day (QID) | ORAL | 0 refills | Status: DC | PRN

## 2020-05-07 MED ORDER — FOLIC ACID 1 MG PO TABS: 1.0000 mg | ORAL_TABLET | Freq: Every day | ORAL | 2 refills | Status: DC

## 2020-05-07 MED ORDER — GABAPENTIN 300 MG PO CAPS: 300.0000 mg | ORAL_CAPSULE | Freq: Three times a day (TID) | ORAL | 5 refills | Status: DC

## 2020-05-07 NOTE — Telephone Encounter (Signed)
Med Refill : Folic Acid ,Gabapentin, Vitamin D hydroxyurea

## 2020-05-07 NOTE — Progress Notes (Signed)
Meds ordered this encounter  Medications  . oxyCODONE-acetaminophen (PERCOCET) 10-325 MG tablet    Sig: Take 1 tablet by mouth every 6 (six) hours as needed for up to 15 days for pain.    Dispense:  60 tablet    Refill:  0  . hydroxyurea (HYDREA) 500 MG capsule    Sig: Take 1 capsule (500 mg total) by mouth 2 (two) times daily. May take with food to minimize GI side effects.    Dispense:  180 capsule    Refill:  2  . gabapentin (NEURONTIN) 300 MG capsule    Sig: Take 1 capsule (300 mg total) by mouth 3 (three) times daily.    Dispense:  90 capsule    Refill:  5  . folic acid (FOLVITE) 1 MG tablet    Sig: Take 1 tablet (1 mg total) by mouth daily.    Dispense:  90 tablet    Refill:  2   Angelica Chessman, MD  05/07/20

## 2020-05-12 ENCOUNTER — Ambulatory Visit (INDEPENDENT_AMBULATORY_CARE_PROVIDER_SITE_OTHER): Payer: Medicare Other | Admitting: Internal Medicine

## 2020-05-12 ENCOUNTER — Other Ambulatory Visit: Payer: Self-pay

## 2020-05-12 ENCOUNTER — Encounter: Payer: Self-pay | Admitting: Internal Medicine

## 2020-05-12 VITALS — BP 132/77 | HR 83 | Ht 74.0 in | Wt 122.0 lb

## 2020-05-12 DIAGNOSIS — Z86718 Personal history of other venous thrombosis and embolism: Secondary | ICD-10-CM | POA: Diagnosis not present

## 2020-05-12 DIAGNOSIS — F1290 Cannabis use, unspecified, uncomplicated: Secondary | ICD-10-CM

## 2020-05-12 DIAGNOSIS — D571 Sickle-cell disease without crisis: Secondary | ICD-10-CM

## 2020-05-12 DIAGNOSIS — G894 Chronic pain syndrome: Secondary | ICD-10-CM

## 2020-05-12 DIAGNOSIS — I2724 Chronic thromboembolic pulmonary hypertension: Secondary | ICD-10-CM

## 2020-05-12 LAB — POCT URINALYSIS DIPSTICK
Blood, UA: NEGATIVE
Glucose, UA: NEGATIVE
Ketones, UA: NEGATIVE
Leukocytes, UA: NEGATIVE
Nitrite, UA: NEGATIVE
Protein, UA: NEGATIVE
Spec Grav, UA: 1.02 (ref 1.010–1.025)
Urobilinogen, UA: 4 E.U./dL — AB
pH, UA: 8.5 — AB (ref 5.0–8.0)

## 2020-05-12 NOTE — Progress Notes (Signed)
Timothy Marks, is a 30 y.o. male  GGY:[id number]  OJJ:[id number]  DOB - [birth year]  Chief Complaint  Patient presents with  . Sickle Cell Anemia       Subjective:   Timothy Marks is a 30 y.o. male with medical history of sickle cell disease, hemoglobin SS, major depression and vitamin D deficiency who presented here today for a routine follow up visit.  Patient has no complaint today.  He denies any significant pain.  He needs his medications refilled including hydroxyurea, gabapentin and folic acid.  Patient is up-to-date with his vaccinations.  He continues to smoke heavily, and not ready to quit but thinking about it.  He said he does not need any medical help to quit for now but will ask at the appropriate time.  Patient has No headache, No chest pain, No abdominal pain - No Nausea, No new weakness tingling or numbness, No Cough - SOB.  No problems updated.  ALLERGIES: Allergies  Allergen Reactions  . Buprenorphine Hcl Hives  . Levaquin [Levofloxacin] Itching  . Meperidine Rash  . Morphine Hives  . Morphine And Related Hives  . Toradol [Ketorolac Tromethamine] Itching  . Tramadol Hives  . Vancomycin Itching  . Zosyn [Piperacillin Sod-Tazobactam So] Itching, Rash and Other (See Comments)    Has taken rocephin in the past    PAST MEDICAL HISTORY: Past Medical History:  Diagnosis Date  . Depression   . Generalized weakness 03/31/2019  . 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 Sickle cell anemia (HCC)   . Vitamin D deficiency y-6    MEDICATIONS AT HOME: Prior to Admission medications   Medication Sig Start Date End Date Taking? Authorizing Provider  cholecalciferol (VITAMIN D) 1000 units tablet Take 2 tablets (2,000 Units total) by mouth daily. 12/13/17  Yes Azzie Glatter, FNP  folic acid (FOLVITE) 1 MG tablet Take 1 tablet (1 mg total) by mouth daily. 05/07/20  Yes Tresa Garter, MD  gabapentin  (NEURONTIN) 300 MG capsule Take 1 capsule (300 mg total) by mouth 3 (three) times daily. 05/07/20  Yes Tresa Garter, MD  hydroxyurea (HYDREA) 500 MG capsule Take 1 capsule (500 mg total) by mouth 2 (two) times daily. May take with food to minimize GI side effects. 05/07/20  Yes Tresa Garter, MD  hydrOXYzine (ATARAX/VISTARIL) 10 MG tablet Take 1 tablet (10 mg total) by mouth 3 (three) times daily as needed for itching. 01/16/20  Yes Tresa Garter, MD  Nutritional Supplements (ENSURE ACTIVE HIGH PROTEIN) LIQD Take 1 each by mouth 3 (three) times daily between meals. #90 cans of Ensure Patient taking differently: Take 1 each by mouth See admin instructions. 5 times daily 11/13/18  Yes Dorena Dew, FNP  oxyCODONE-acetaminophen (PERCOCET) 10-325 MG tablet Take 1 tablet by mouth every 6 (six) hours as needed for up to 15 days for pain. 05/08/20 05/23/20 Yes Zameria Vogl E, MD  OXYCONTIN 20 MG 12 hr tablet Take 1 tablet (20 mg total) by mouth every 12 (twelve) hours. 04/29/20 05/29/20 Yes Daksha Koone E, MD  XARELTO 20 MG TABS tablet TAKE 1 TABLET(20 MG) BY MOUTH DAILY WITH SUPPER 04/18/20  Yes Linde Wilensky E, MD    Objective:   Vitals:   05/12/20 0956  BP: 132/77  Pulse: 83  SpO2: 100%  Weight: 122 lb (55.3 kg)  Height: 6' 2"  (1.88 m)   Exam General appearance : Awake, alert, not  in any distress. Speech Clear. Not toxic looking HEENT: Atraumatic and Normocephalic, pupils equally reactive to light and accomodation Neck: Supple, no JVD. No cervical lymphadenopathy.  Chest: Good air entry bilaterally, no added sounds  CVS: S1 S2 regular, no murmurs.  Abdomen: Bowel sounds present, Non tender and not distended with no gaurding, rigidity or rebound. Extremities: B/L Lower Ext shows no edema, both legs are warm to touch Neurology: Awake alert, and oriented X 3, CN II-XII intact, Non focal Skin: No Rash  Data Review No results found for: HGBA1C  Assessment &  Plan   1. Hb-SS disease without crisis ([place])  - ECHOCARDIOGRAM COMPLETE; Future - CBC w/Diff - Comp Met (CMET) - Ambulatory referral to Ophthalmology  Continue Hydrea. We discussed the need for good hydration, monitoring of hydration status, avoidance of heat, cold, stress, and infection triggers. We discussed the risks and benefits of Hydrea, including bone marrow suppression, the possibility of GI upset, skin ulcers, hair thinning, and teratogenicity. The patient was reminded of the need to seek medical attention for any symptoms of bleeding, anemia, or infection. Continue folic acid 1 mg daily to prevent aplastic bone marrow crises  2. Chronic pain syndrome We agreed on Opiate dose and amount of pills  per month. We discussed that pt is to receive Schedule II prescriptions only from our clinic. Pt is also aware that the prescription history is available to [place] online through the [facility] CSRS. Controlled substance agreement reviewed and signed. We reminded Denorris that all patients receiving Schedule II narcotics must be seen for follow within one month of prescription being requested. We reviewed the terms of our pain agreement, including the need to keep medicines in a safe locked location away from children or pets, and the need to report excess sedation or constipation, measures to avoid constipation, and policies related to early refills and stolen prescriptions. According to the [place] [facility] program, we have reviewed details related to analgesia, adverse effects and aberrant behaviors.  3. History of DVT (deep vein thrombosis)  - CBC w/Diff - Continue Xarelto -Patient counseled extensively.  4. Marijuana use Joson was counseled on the dangers of tobacco use, and was advised to quit. Reviewed strategies to maximize success, including removing cigarettes and smoking materials from environment, stress management and support of family/friends.  5. Chronic thromboembolic pulmonary  hypertension (HCC)   - ECHOCARDIOGRAM COMPLETE; Future   Patient have been counseled extensively about nutrition and exercise. Other issues discussed during this visit include: low cholesterol diet, foot care, annual eye examinations at Ophthalmology, importance of adherence with medications and regular follow-up.   Return in about 3 months (around 08/12/2020), or if symptoms worsen or fail to improve, for Sickle Cell Disease/Pain, Follow up Pain and comorbidities.  The patient was given clear instructions to go to ER or return to medical center if symptoms don't improve, worsen or new problems develop. The patient verbalized understanding. The patient was told to call to get lab results if they haven't heard anything in the next week.   This note has been created with Surveyor, quantity. Any transcriptional errors are unintentional.    Angelica Chessman, MD, MHA, Karilyn Cota, [place] and [place], [place]   05/12/2020, 11:43 AM

## 2020-05-13 LAB — CBC WITH DIFFERENTIAL/PLATELET
Basophils Absolute: 0 10*3/uL (ref 0.0–0.2)
Basos: 0 %
EOS (ABSOLUTE): 0.1 10*3/uL (ref 0.0–0.4)
Eos: 1 %
Hematocrit: 35.6 % — ABNORMAL LOW (ref 37.5–51.0)
Hemoglobin: 12.2 g/dL — ABNORMAL LOW (ref 13.0–17.7)
Immature Grans (Abs): 0 10*3/uL (ref 0.0–0.1)
Immature Granulocytes: 0 %
Lymphocytes Absolute: 1.4 10*3/uL (ref 0.7–3.1)
Lymphs: 20 %
MCH: 30.3 pg (ref 26.6–33.0)
MCHC: 34.3 g/dL (ref 31.5–35.7)
MCV: 89 fL (ref 79–97)
Monocytes Absolute: 0.5 10*3/uL (ref 0.1–0.9)
Monocytes: 7 %
NRBC: 2 % — ABNORMAL HIGH (ref 0–0)
Neutrophils Absolute: 4.8 10*3/uL (ref 1.4–7.0)
Neutrophils: 72 %
Platelets: 378 10*3/uL (ref 150–450)
RBC: 4.02 x10E6/uL — ABNORMAL LOW (ref 4.14–5.80)
RDW: 18.7 % — ABNORMAL HIGH (ref 11.6–15.4)
WBC: 6.8 10*3/uL (ref 3.4–10.8)

## 2020-05-13 LAB — COMPREHENSIVE METABOLIC PANEL
ALT: 7 IU/L (ref 0–44)
AST: 25 IU/L (ref 0–40)
Albumin/Globulin Ratio: 1.7 (ref 1.2–2.2)
Albumin: 4.6 g/dL (ref 4.0–5.0)
Alkaline Phosphatase: 103 IU/L (ref 44–121)
BUN/Creatinine Ratio: 8 — ABNORMAL LOW (ref 9–20)
BUN: 5 mg/dL — ABNORMAL LOW (ref 6–20)
Bilirubin Total: 2 mg/dL — ABNORMAL HIGH (ref 0.0–1.2)
CO2: 21 mmol/L (ref 20–29)
Calcium: 9.3 mg/dL (ref 8.7–10.2)
Chloride: 104 mmol/L (ref 96–106)
Creatinine, Ser: 0.65 mg/dL — ABNORMAL LOW (ref 0.76–1.27)
Globulin, Total: 2.7 g/dL (ref 1.5–4.5)
Glucose: 88 mg/dL (ref 65–99)
Potassium: 4.4 mmol/L (ref 3.5–5.2)
Sodium: 140 mmol/L (ref 134–144)
Total Protein: 7.3 g/dL (ref 6.0–8.5)
eGFR: 129 mL/min/{1.73_m2} (ref >59)

## 2020-05-18 ENCOUNTER — Telehealth (HOSPITAL_COMMUNITY): Payer: Self-pay | Admitting: *Deleted

## 2020-05-18 ENCOUNTER — Other Ambulatory Visit (INDEPENDENT_AMBULATORY_CARE_PROVIDER_SITE_OTHER): Payer: Self-pay | Admitting: Internal Medicine

## 2020-05-18 DIAGNOSIS — Z79891 Long term (current) use of opiate analgesic: Secondary | ICD-10-CM

## 2020-05-18 DIAGNOSIS — G894 Chronic pain syndrome: Secondary | ICD-10-CM

## 2020-05-18 NOTE — Telephone Encounter (Signed)
Patient called requesting to come to the day hospital for sickle cell pain. Patient reports back pain rated 8/10. Reports taking Oxycontin and Percocet at 11:00 pm last night. Per patient, he still has medications but they are not working to control his pain. COVID-19 screening done and patient denies all symptoms and exposures. Denies fever, chest pain, nausea, vomiting, diarrhea, abdominal pain and priapism. [place], [place] notified and advised that patient should continue to take home pain medications around the clock as prescribed. Patient advised to call back to the day hospital in the morning if pain remains uncontrolled. Patient advised and expresses an understanding.

## 2020-05-19 ENCOUNTER — Telehealth: Payer: Self-pay

## 2020-05-19 ENCOUNTER — Other Ambulatory Visit: Payer: Self-pay

## 2020-05-19 DIAGNOSIS — D571 Sickle-cell disease without crisis: Secondary | ICD-10-CM

## 2020-05-19 DIAGNOSIS — G894 Chronic pain syndrome: Secondary | ICD-10-CM

## 2020-05-19 NOTE — Telephone Encounter (Signed)
Med refill Oxycodone 10 mg 325

## 2020-05-19 NOTE — Telephone Encounter (Signed)
Multiple calls to [facility] and attempts on [employer] to get clarification if prior [name] is needed for echocardiogram.  Per Marchia Bond Ref# [id number] no authorization is needed.  Lattie Haw with [facility] aware.

## 2020-05-20 ENCOUNTER — Other Ambulatory Visit: Payer: Self-pay

## 2020-05-20 ENCOUNTER — Ambulatory Visit (HOSPITAL_COMMUNITY): Payer: Medicare Other | Attending: Cardiology

## 2020-05-20 DIAGNOSIS — D571 Sickle-cell disease without crisis: Secondary | ICD-10-CM | POA: Diagnosis not present

## 2020-05-20 DIAGNOSIS — I2724 Chronic thromboembolic pulmonary hypertension: Secondary | ICD-10-CM | POA: Insufficient documentation

## 2020-05-20 DIAGNOSIS — G894 Chronic pain syndrome: Secondary | ICD-10-CM

## 2020-05-20 LAB — ECHOCARDIOGRAM COMPLETE
Area-P 1/2: 6.17 cm2
S' Lateral: 2.7 cm

## 2020-05-22 ENCOUNTER — Telehealth: Payer: Self-pay

## 2020-05-22 ENCOUNTER — Other Ambulatory Visit: Payer: Self-pay | Admitting: Family Medicine

## 2020-05-22 DIAGNOSIS — G894 Chronic pain syndrome: Secondary | ICD-10-CM

## 2020-05-22 DIAGNOSIS — D571 Sickle-cell disease without crisis: Secondary | ICD-10-CM

## 2020-05-22 MED ORDER — OXYCODONE-ACETAMINOPHEN 10-325 MG PO TABS: 1.0000 | ORAL_TABLET | Freq: Four times a day (QID) | ORAL | 0 refills | Status: DC | PRN

## 2020-05-22 NOTE — Progress Notes (Signed)
Reviewed PDMP substance reporting system prior to prescribing opiate medications. No inconsistencies noted.  Meds ordered this encounter  Medications   oxyCODONE-acetaminophen (PERCOCET) 10-325 MG tablet    Sig: Take 1 tablet by mouth every 6 (six) hours as needed for up to 15 days for pain.    Dispense:  60 tablet    Refill:  0    Order Specific Question:   Supervising Provider    Answer:   JEGEDE, OLUGBEMIGA E [[id number]]   Timothy Marks Harjit Leider  APRN, MSN, FNP-C [facility] [address]  [place], [place] [address] [phone]  

## 2020-05-22 NOTE — Telephone Encounter (Signed)
Pt has Dr Lenna Sciara is on his medicaid ..is there ANYWAY you can have Dr Lenna Sciara filled on his vacation or let him know please!

## 2020-05-29 ENCOUNTER — Other Ambulatory Visit: Payer: Self-pay | Admitting: Internal Medicine

## 2020-05-29 DIAGNOSIS — Z86718 Personal history of other venous thrombosis and embolism: Secondary | ICD-10-CM

## 2020-06-01 NOTE — Telephone Encounter (Signed)
Med refill Oxycodone  OxyContin is making him itch wanted the provider to know

## 2020-06-02 ENCOUNTER — Telehealth (HOSPITAL_COMMUNITY): Payer: Self-pay

## 2020-06-02 ENCOUNTER — Non-Acute Institutional Stay (HOSPITAL_COMMUNITY)
Admission: AD | Admit: 2020-06-02 | Discharge: 2020-06-02 | Disposition: A | Discharge status: Home / Self Care | Payer: Medicare Other | Source: Ambulatory Visit | Attending: Internal Medicine | Admitting: Internal Medicine

## 2020-06-02 DIAGNOSIS — Z79899 Other long term (current) drug therapy: Secondary | ICD-10-CM | POA: Diagnosis not present

## 2020-06-02 DIAGNOSIS — Z885 Allergy status to narcotic agent status: Secondary | ICD-10-CM | POA: Insufficient documentation

## 2020-06-02 DIAGNOSIS — D5700 Hb-SS disease with crisis, unspecified: Secondary | ICD-10-CM | POA: Diagnosis present

## 2020-06-02 DIAGNOSIS — Z888 Allergy status to other drugs, medicaments and biological substances status: Secondary | ICD-10-CM | POA: Diagnosis not present

## 2020-06-02 DIAGNOSIS — Z881 Allergy status to other antibiotic agents status: Secondary | ICD-10-CM | POA: Diagnosis not present

## 2020-06-02 DIAGNOSIS — Z7901 Long term (current) use of anticoagulants: Secondary | ICD-10-CM | POA: Diagnosis not present

## 2020-06-02 DIAGNOSIS — Z880 Allergy status to penicillin: Secondary | ICD-10-CM | POA: Diagnosis not present

## 2020-06-02 DIAGNOSIS — D638 Anemia in other chronic diseases classified elsewhere: Secondary | ICD-10-CM | POA: Diagnosis not present

## 2020-06-02 LAB — COMPREHENSIVE METABOLIC PANEL
ALT: 9 U/L (ref 0–44)
AST: 21 U/L (ref 15–41)
Albumin: 5 g/dL (ref 3.5–5.0)
Alkaline Phosphatase: 68 U/L (ref 38–126)
Anion gap: 11 (ref 5–15)
BUN: 6 mg/dL (ref 6–20)
CO2: 23 mmol/L (ref 22–32)
Calcium: 9.6 mg/dL (ref 8.9–10.3)
Chloride: 107 mmol/L (ref 98–111)
Creatinine, Ser: 0.56 mg/dL — ABNORMAL LOW (ref 0.61–1.24)
GFR, Estimated: >60 mL/min (ref >60)
Glucose, Bld: 98 mg/dL (ref 70–99)
Potassium: 3.7 mmol/L (ref 3.5–5.1)
Sodium: 141 mmol/L (ref 135–145)
Total Bilirubin: 2.1 mg/dL — ABNORMAL HIGH (ref 0.3–1.2)
Total Protein: 8.6 g/dL — ABNORMAL HIGH (ref 6.5–8.1)

## 2020-06-02 LAB — CBC WITH DIFFERENTIAL/PLATELET
Abs Immature Granulocytes: 0.03 10*3/uL (ref 0.00–0.07)
Basophils Absolute: 0 10*3/uL (ref 0.0–0.1)
Basophils Relative: 0 %
Eosinophils Absolute: 0.1 10*3/uL (ref 0.0–0.5)
Eosinophils Relative: 2 %
HCT: 30.4 % — ABNORMAL LOW (ref 39.0–52.0)
Hemoglobin: 11 g/dL — ABNORMAL LOW (ref 13.0–17.0)
Immature Granulocytes: 1 %
Lymphocytes Relative: 30 %
Lymphs Abs: 1.7 10*3/uL (ref 0.7–4.0)
MCH: 34.6 pg — ABNORMAL HIGH (ref 26.0–34.0)
MCHC: 36.2 g/dL — ABNORMAL HIGH (ref 30.0–36.0)
MCV: 95.6 fL (ref 80.0–100.0)
Monocytes Absolute: 0.3 10*3/uL (ref 0.1–1.0)
Monocytes Relative: 5 %
Neutro Abs: 3.5 10*3/uL (ref 1.7–7.7)
Neutrophils Relative %: 62 %
Platelets: 362 10*3/uL (ref 150–400)
RBC: 3.18 MIL/uL — ABNORMAL LOW (ref 4.22–5.81)
RDW: 17.9 % — ABNORMAL HIGH (ref 11.5–15.5)
WBC: 5.6 10*3/uL (ref 4.0–10.5)
nRBC: 1.1 % — ABNORMAL HIGH (ref 0.0–0.2)

## 2020-06-02 LAB — LACTATE DEHYDROGENASE: LDH: 223 U/L — ABNORMAL HIGH (ref 98–192)

## 2020-06-02 MED ORDER — SODIUM CHLORIDE 0.9 % IV SOLN
25.0000 mg | INTRAVENOUS | Status: DC | PRN
  Filled 2020-06-02: qty 0.5

## 2020-06-02 MED ORDER — SENNOSIDES-DOCUSATE SODIUM 8.6-50 MG PO TABS: 1.0000 | ORAL_TABLET | Freq: Two times a day (BID) | ORAL | Status: DC

## 2020-06-02 MED ORDER — POLYETHYLENE GLYCOL 3350 17 G PO PACK: 17.0000 g | PACK | Freq: Every day | ORAL | Status: DC | PRN

## 2020-06-02 MED ORDER — KETOROLAC TROMETHAMINE 30 MG/ML IJ SOLN
30.0000 mg | Freq: Four times a day (QID) | INTRAMUSCULAR | Status: DC
  Administered 2020-06-02: 30 mg via INTRAVENOUS
  Filled 2020-06-02: qty 1

## 2020-06-02 MED ORDER — NALOXONE HCL 0.4 MG/ML IJ SOLN: 0.4000 mg | INTRAMUSCULAR | Status: DC | PRN

## 2020-06-02 MED ORDER — SODIUM CHLORIDE 0.9% FLUSH: 9.0000 mL | INTRAVENOUS | Status: DC | PRN

## 2020-06-02 MED ORDER — HYDROMORPHONE 1 MG/ML IV SOLN
INTRAVENOUS | Status: DC
Start: 2020-06-02 — End: 2020-06-02
  Administered 2020-06-02: 30 mg via INTRAVENOUS
  Administered 2020-06-02: 10 mg via INTRAVENOUS
  Filled 2020-06-02: qty 30

## 2020-06-02 MED ORDER — ONDANSETRON HCL 4 MG/2ML IJ SOLN
4.0000 mg | Freq: Four times a day (QID) | INTRAMUSCULAR | Status: DC | PRN
  Administered 2020-06-02: 4 mg via INTRAVENOUS
  Filled 2020-06-02: qty 2

## 2020-06-02 MED ORDER — DIPHENHYDRAMINE HCL 25 MG PO CAPS
25.0000 mg | ORAL_CAPSULE | ORAL | Status: DC | PRN
  Administered 2020-06-02: 25 mg via ORAL
  Filled 2020-06-02: qty 1

## 2020-06-02 MED ORDER — DEXTROSE-NACL 5-0.45 % IV SOLN: INTRAVENOUS | Status: DC

## 2020-06-02 NOTE — Telephone Encounter (Signed)
Patient called in. Complains of pain in lower back rates 9/10. Denied chest pain, abd pain, fever, N/V/D, or priapism. Wants to come in for treatment. Last took Oxycontin and Percocet at 6:00am. Pt states he has been waiting at the ED since 4:00am but has not been treated. Pt states he does not have transportation home, is agreeable to transport arranged by Education officer, [employer]. Denies exposure to anyone who is covid positive in last two weeks, denies having been covid tested in last two weeks, denies flu like symptoms today. Dr. Jonelle Sidle made aware, approved pt to come to day hospital today. Pt notified, verbalized understanding.

## 2020-06-02 NOTE — Progress Notes (Signed)
Pt admitted to the day hospital for treatment of sickle cell pain crisis. Pt reported pain to lower back rated 9/10. Pt received PO Benadryl, IV toradol and IV zofran.  Pt placed on Dilaudid PCA, and hydrated with IV fluids. At discharge patient reported pain a 5/10. Discharge instructions given to pt. Pt alert , oriented and ambulatory at discharge.

## 2020-06-02 NOTE — H&P (Signed)
Timothy Marks is an 30 y.o. male.   Chief Complaint: Sickle cell crisis  HPI: Patient with history of sickle cell disease who started having pain in his back and legs.  Patient has tried his home regimen but no relief.  He has history of generalized weakness and depression.  Patient came to the ER for where he was seen and evaluated.  At this point it appears he is having one of his sickle cell crisis and will be admitted to the day hospital for treatment.  No nausea vomiting or diarrhea no fever  Past Medical History:  Diagnosis Date  . Depression   . Generalized weakness 03/31/2019  . 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 Sickle cell anemia (HCC)   . Vitamin D deficiency y-6    Past Surgical History:  Procedure Laterality Date  . CHOLECYSTECTOMY      Family History  Problem Relation Age of Onset  . Sickle cell trait Mother   . Sickle cell trait Father    Social History:  reports that he has never smoked. He has never used smokeless tobacco. He reports previous drug use. Drug: Marijuana. He reports that he does not drink alcohol.  Allergies:  Allergies  Allergen Reactions  . Buprenorphine Hcl Hives  . Levaquin [Levofloxacin] Itching  . Meperidine Rash  . Morphine Hives  . Morphine And Related Hives  . Toradol [Ketorolac Tromethamine] Itching  . Tramadol Hives  . Vancomycin Itching  . Zosyn [Piperacillin Sod-Tazobactam So] Itching, Rash and Other (See Comments)    Has taken rocephin in the past    Medications Prior to Admission  Medication Sig Dispense Refill  . cholecalciferol (VITAMIN D) 1000 units tablet Take 2 tablets (2,000 Units total) by mouth daily. 30 tablet 6  . folic acid (FOLVITE) 1 MG tablet Take 1 tablet (1 mg total) by mouth daily. 90 tablet 2  . gabapentin (NEURONTIN) 300 MG capsule Take 1 capsule (300 mg total) by mouth 3 (three) times daily. 90 capsule 5  . hydroxyurea (HYDREA) 500 MG capsule  Take 1 capsule (500 mg total) by mouth 2 (two) times daily. May take with food to minimize GI side effects. 180 capsule 2  . hydrOXYzine (ATARAX/VISTARIL) 10 MG tablet Take 1 tablet (10 mg total) by mouth 3 (three) times daily as needed for itching. 30 tablet 0  . Nutritional Supplements (ENSURE ACTIVE HIGH PROTEIN) LIQD Take 1 each by mouth 3 (three) times daily between meals. #90 cans of Ensure (Patient taking differently: Take 1 each by mouth See admin instructions. 5 times daily) 237 mL 11  . oxyCODONE-acetaminophen (PERCOCET) 10-325 MG tablet Take 1 tablet by mouth every 6 (six) hours as needed for up to 15 days for pain. 60 tablet 0  . XARELTO 20 MG TABS tablet TAKE 1 TABLET(20 MG) BY MOUTH DAILY WITH SUPPER 30 tablet 0    No results found for this or any previous visit (from the past 35 hour(s)). No results found.  Review of Systems  Musculoskeletal: Positive for arthralgias.  All other systems reviewed and are negative.   There were no vitals taken for this visit. Physical Exam Constitutional:      Appearance: Normal appearance.  HENT:     Head: Normocephalic and atraumatic.     Right Ear: Tympanic membrane normal.     Left Ear: Tympanic membrane normal.     Nose: Nose normal.     Mouth/Throat:  Mouth: Mucous membranes are dry.  Eyes:     Extraocular Movements: Extraocular movements intact.     Pupils: Pupils are equal, round, and reactive to light.  Cardiovascular:     Rate and Rhythm: Normal rate and regular rhythm.     Pulses: Normal pulses.     Heart sounds: Normal heart sounds.  Pulmonary:     Effort: Pulmonary effort is normal.     Breath sounds: Normal breath sounds.  Abdominal:     General: Bowel sounds are normal.     Palpations: Abdomen is soft.  Musculoskeletal:        General: Normal range of motion.     Cervical back: Normal range of motion and neck supple.  Skin:    General: Skin is warm and dry.  Neurological:     General: No focal deficit  present.     Mental Status: He is alert.      Assessment/Plan 30 year old here with sickle cell painful crisis  #1 sickle cell painful crisis: Patient will be admitted to the sickle cell day hospital.  Initiate Dilaudid, Toradol and home regimen.  IV fluids and supportive care.  #2 anemia of chronic disease: Recheck H&H.  Monitor closely.  Barbette Merino, MD 06/02/2020, 9:19 AM

## 2020-06-04 ENCOUNTER — Other Ambulatory Visit: Payer: Self-pay | Admitting: Family Medicine

## 2020-06-04 DIAGNOSIS — D571 Sickle-cell disease without crisis: Secondary | ICD-10-CM

## 2020-06-04 DIAGNOSIS — G894 Chronic pain syndrome: Secondary | ICD-10-CM

## 2020-06-04 IMAGING — DX LUMBAR SPINE - 1 VIEW
1 series · 1 of 1 positions shown · non-contrast
Comparison: 10/11/2016

CLINICAL DATA: Sickle cell pain.  Low back pain.  No injury.

EXAM:
LUMBAR SPINE - 1 VIEW

[l-spine ap]
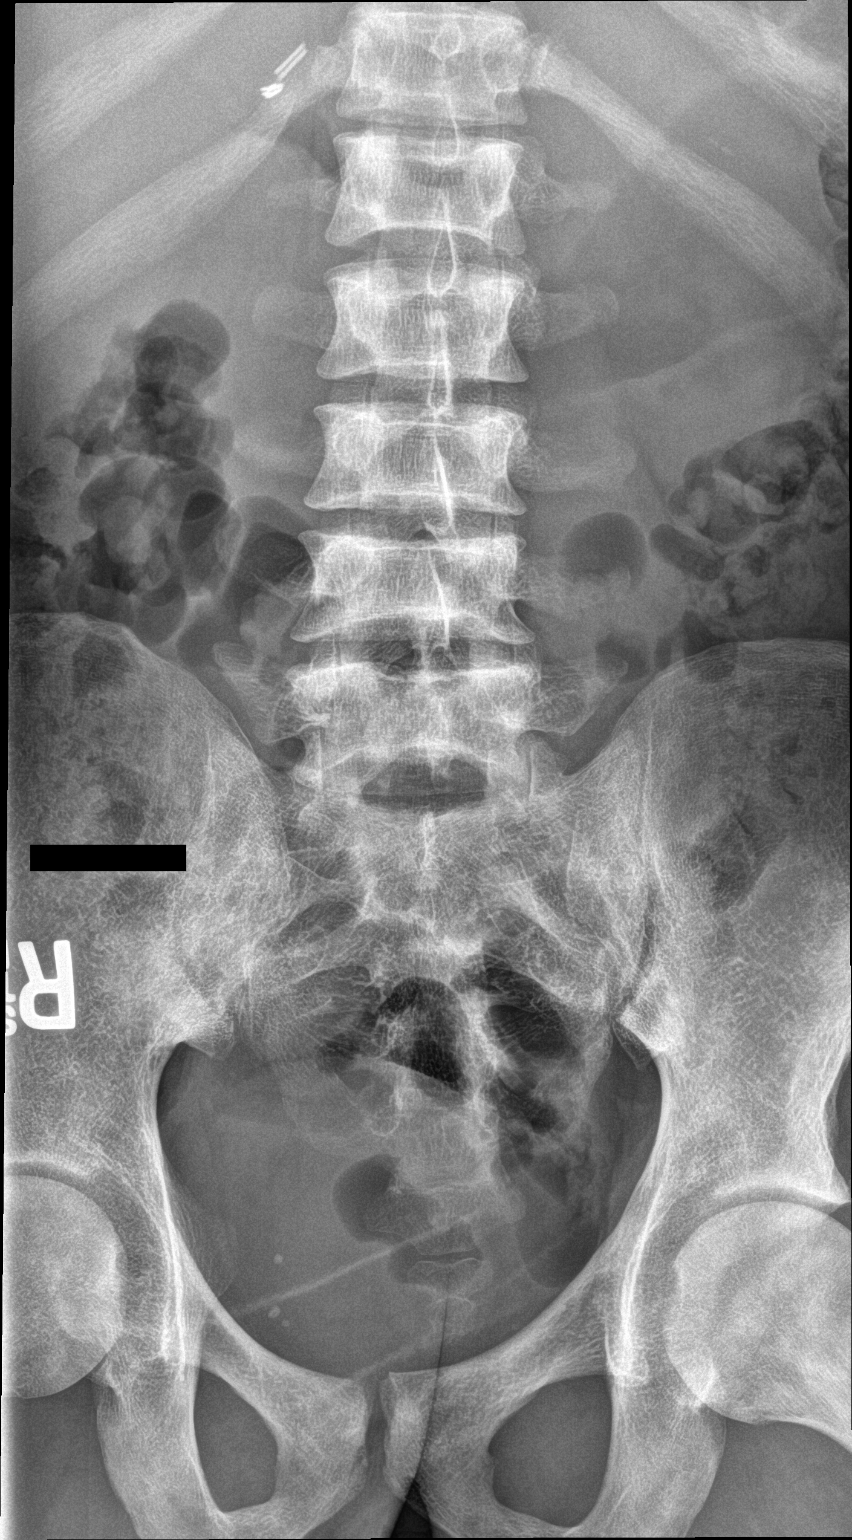

[1 of 1 positions shown; findings below may reference images not displayed]

FINDINGS: Only single AP view submitted which limits study. Diffuse bony
changes of sickle cell again noted throughout the lumbar spine and
pelvis. No visible fracture. Disc spaces appear maintained.
IMPRESSION: Limited single AP view only, limiting evaluation. No visible acute
bony abnormality.

Osseous changes of sickle cell disease.

## 2020-06-04 MED ORDER — OXYCODONE-ACETAMINOPHEN 10-325 MG PO TABS: 1.0000 | ORAL_TABLET | Freq: Four times a day (QID) | ORAL | 0 refills | Status: DC | PRN

## 2020-06-04 NOTE — Progress Notes (Signed)
Reviewed PDMP substance reporting system prior to prescribing opiate medications. No inconsistencies noted.  Meds ordered this encounter  Medications   oxyCODONE-acetaminophen (PERCOCET) 10-325 MG tablet    Sig: Take 1 tablet by mouth every 6 (six) hours as needed for up to 15 days for pain.    Dispense:  60 tablet    Refill:  0    Order Specific Question:   Supervising Provider    Answer:   JEGEDE, OLUGBEMIGA E [[id number]]   Dejanira Pamintuan Moore Takerra Lupinacci  APRN, MSN, FNP-C [facility] [address]  [place], [place] [address] [phone]  

## 2020-06-05 NOTE — Discharge Summary (Addendum)
Physician Discharge Summary  Patient ID: Timothy Marks MRN: [id number] DOB/AGE: 10/17/1990 30 y.o.  Admit date: 06/02/2020 Discharge date: 06/02/2020  Admission Diagnoses:  Discharge Diagnoses:  Active Problems:   Sickle cell anemia with pain [facility])   Discharged Condition: good  Hospital Course: Patient was in the day hospital and was treated with IV Dilaudid PCA as well as oral oxycodone.  IV fluids and supportive care.  After about 4 hours in the day hospital he felt better and is ready for discharge.  He was discharged to follow-up with PCP.  Prescriptions have been ordered.  Consults: None  Significant Diagnostic Studies: labs: CBC and CMP.  No transfusion required  Treatments: IV hydration and analgesia: acetaminophen and Dilaudid  Discharge Exam: Blood pressure 107/60, pulse (!) 57, temperature 97.7 F (36.5 C), temperature source Temporal, resp. rate 13, SpO2 100 %. General appearance: alert, cooperative and no distress Back: symmetric, no curvature. ROM normal. No CVA tenderness. Cardio: regular rate and rhythm, S1, S2 normal, no murmur, click, rub or gallop GI: soft, non-tender; bowel sounds normal; no masses,  no organomegaly Extremities: extremities normal, atraumatic, no cyanosis or edema Pulses: 2+ and symmetric  Disposition: Discharge disposition: 01-Home or Self Care        Allergies as of 06/02/2020      Reactions   Buprenorphine Hcl Hives   Levaquin [levofloxacin] Itching   Meperidine Rash   Morphine Hives   Morphine And Related Hives   Toradol [ketorolac Tromethamine] Itching   Tramadol Hives   Vancomycin Itching   Zosyn [piperacillin Sod-tazobactam So] Itching, Rash, Other (See Comments)   Has taken rocephin in the past      Medication List    ASK your doctor about these medications   cholecalciferol 25 MCG (1000 UNIT) tablet Commonly known as: VITAMIN D Take 2 tablets (2,000 Units total) by mouth daily.   Ensure Active High Protein  Liqd Take 1 each by mouth 3 (three) times daily between meals. #90 cans of Ensure   folic acid 1 MG tablet Commonly known as: FOLVITE Take 1 tablet (1 mg total) by mouth daily.   gabapentin 300 MG capsule Commonly known as: NEURONTIN Take 1 capsule (300 mg total) by mouth 3 (three) times daily.   hydroxyurea 500 MG capsule Commonly known as: HYDREA Take 1 capsule (500 mg total) by mouth 2 (two) times daily. May take with food to minimize GI side effects.   hydrOXYzine 10 MG tablet Commonly known as: ATARAX/VISTARIL Take 1 tablet (10 mg total) by mouth 3 (three) times daily as needed for itching.   Xarelto 20 MG Tabs tablet Generic drug: rivaroxaban TAKE 1 TABLET(20 MG) BY MOUTH DAILY WITH SUPPER        Signed: Haeley Fordham,LAWAL 06/05/2020, 10:23 PM

## 2020-06-15 ENCOUNTER — Telehealth: Payer: Self-pay

## 2020-06-15 NOTE — Telephone Encounter (Signed)
Med refill   Percocet and Oxycontin

## 2020-06-16 ENCOUNTER — Other Ambulatory Visit: Payer: Self-pay | Admitting: Internal Medicine

## 2020-06-16 ENCOUNTER — Other Ambulatory Visit: Payer: Self-pay

## 2020-06-16 DIAGNOSIS — G894 Chronic pain syndrome: Secondary | ICD-10-CM

## 2020-06-16 DIAGNOSIS — Z79891 Long term (current) use of opiate analgesic: Secondary | ICD-10-CM

## 2020-06-16 DIAGNOSIS — D571 Sickle-cell disease without crisis: Secondary | ICD-10-CM

## 2020-06-16 MED ORDER — OXYCONTIN 20 MG PO T12A: 20.0000 mg | EXTENDED_RELEASE_TABLET | Freq: Two times a day (BID) | ORAL | 0 refills | Status: DC

## 2020-06-16 NOTE — Progress Notes (Signed)
OxyContin Refilled for 30 days. Next Refill due on 07/16/2020  Angelica Chessman, MD  06/16/20

## 2020-06-17 ENCOUNTER — Telehealth: Payer: Self-pay

## 2020-06-17 ENCOUNTER — Other Ambulatory Visit: Payer: Self-pay | Admitting: Family Medicine

## 2020-06-17 DIAGNOSIS — G894 Chronic pain syndrome: Secondary | ICD-10-CM

## 2020-06-17 DIAGNOSIS — D571 Sickle-cell disease without crisis: Secondary | ICD-10-CM

## 2020-06-17 MED ORDER — OXYCODONE-ACETAMINOPHEN 10-325 MG PO TABS: 1.0000 | ORAL_TABLET | Freq: Four times a day (QID) | ORAL | 0 refills | Status: DC | PRN

## 2020-06-17 NOTE — Telephone Encounter (Signed)
Med refill Oxycodone 

## 2020-06-17 NOTE — Progress Notes (Signed)
Reviewed PDMP substance reporting system prior to prescribing opiate medications. No inconsistencies noted.  Meds ordered this encounter  Medications   oxyCODONE-acetaminophen (PERCOCET) 10-325 MG tablet    Sig: Take 1 tablet by mouth every 6 (six) hours as needed for up to 15 days for pain.    Dispense:  60 tablet    Refill:  0    Order Specific Question:   Supervising Provider    Answer:   JEGEDE, OLUGBEMIGA E [[id number]]   Timothy Marks Maris Bena  APRN, MSN, FNP-C [facility] [address]  [place], [place] [address] [phone]  

## 2020-06-19 ENCOUNTER — Other Ambulatory Visit: Payer: Self-pay | Admitting: Internal Medicine

## 2020-06-19 DIAGNOSIS — G894 Chronic pain syndrome: Secondary | ICD-10-CM

## 2020-06-19 DIAGNOSIS — D571 Sickle-cell disease without crisis: Secondary | ICD-10-CM

## 2020-06-20 ENCOUNTER — Other Ambulatory Visit: Payer: Self-pay

## 2020-06-20 DIAGNOSIS — Z86718 Personal history of other venous thrombosis and embolism: Secondary | ICD-10-CM

## 2020-06-23 ENCOUNTER — Other Ambulatory Visit: Payer: Self-pay

## 2020-06-23 DIAGNOSIS — D571 Sickle-cell disease without crisis: Secondary | ICD-10-CM

## 2020-06-23 DIAGNOSIS — Z86718 Personal history of other venous thrombosis and embolism: Secondary | ICD-10-CM

## 2020-06-23 DIAGNOSIS — G894 Chronic pain syndrome: Secondary | ICD-10-CM

## 2020-06-24 ENCOUNTER — Other Ambulatory Visit: Payer: Self-pay | Admitting: Internal Medicine

## 2020-06-24 MED ORDER — RIVAROXABAN 20 MG PO TABS
20.0000 mg | ORAL_TABLET | Freq: Every day | ORAL | 3 refills | Status: DC
Start: 2020-06-24 — End: 2021-07-05

## 2020-06-29 ENCOUNTER — Telehealth: Payer: Self-pay

## 2020-06-29 ENCOUNTER — Other Ambulatory Visit: Payer: Self-pay | Admitting: Family Medicine

## 2020-06-29 DIAGNOSIS — D571 Sickle-cell disease without crisis: Secondary | ICD-10-CM

## 2020-06-29 DIAGNOSIS — G894 Chronic pain syndrome: Secondary | ICD-10-CM

## 2020-06-29 MED ORDER — OXYCODONE-ACETAMINOPHEN 10-325 MG PO TABS: 1.0000 | ORAL_TABLET | Freq: Four times a day (QID) | ORAL | 0 refills | Status: DC | PRN

## 2020-06-29 NOTE — Progress Notes (Signed)
Reviewed PDMP substance reporting system prior to prescribing opiate medications. No inconsistencies noted. Patient will not be able to fill medication prior to 07/03/2020  Meds ordered this encounter  Medications  . oxyCODONE-acetaminophen (PERCOCET) 10-325 MG tablet    Sig: Take 1 tablet by mouth every 6 (six) hours as needed for up to 15 days for pain.    Dispense:  60 tablet    Refill:  0    Order Specific Question:   Supervising Provider    Answer:   Tresa Garter [[id number]]     [name]  APRN, MSN, FNP-C [facility] [address] [place], [place] [address] [phone]

## 2020-07-01 ENCOUNTER — Telehealth (HOSPITAL_COMMUNITY): Payer: Self-pay | Admitting: General Practice

## 2020-07-01 NOTE — Telephone Encounter (Signed)
Patient called, requesting to come to the day hospital due to pain in the Back and legs rated at 9/10.  Per provider, the [facility] does not have the capacity to care for the patient today, it's full. Patient told to continue to take pain medications as prescribed but if the pain does not subside, can go to the ER or call the [facility] back tomorrow morning. Patient verbalized understanding.

## 2020-07-05 ENCOUNTER — Other Ambulatory Visit: Payer: Self-pay

## 2020-07-05 DIAGNOSIS — Z79891 Long term (current) use of opiate analgesic: Secondary | ICD-10-CM

## 2020-07-05 DIAGNOSIS — G894 Chronic pain syndrome: Secondary | ICD-10-CM

## 2020-07-11 ENCOUNTER — Other Ambulatory Visit: Payer: Self-pay

## 2020-07-11 DIAGNOSIS — G894 Chronic pain syndrome: Secondary | ICD-10-CM

## 2020-07-11 DIAGNOSIS — Z79891 Long term (current) use of opiate analgesic: Secondary | ICD-10-CM

## 2020-07-14 ENCOUNTER — Ambulatory Visit (INDEPENDENT_AMBULATORY_CARE_PROVIDER_SITE_OTHER): Payer: Medicare Other | Admitting: Internal Medicine

## 2020-07-14 ENCOUNTER — Other Ambulatory Visit: Payer: Self-pay

## 2020-07-14 DIAGNOSIS — G894 Chronic pain syndrome: Secondary | ICD-10-CM

## 2020-07-14 DIAGNOSIS — Z79891 Long term (current) use of opiate analgesic: Secondary | ICD-10-CM

## 2020-07-14 DIAGNOSIS — D571 Sickle-cell disease without crisis: Secondary | ICD-10-CM

## 2020-07-14 MED ORDER — OXYCODONE-ACETAMINOPHEN 10-325 MG PO TABS: 1.0000 | ORAL_TABLET | Freq: Four times a day (QID) | ORAL | 0 refills | Status: DC | PRN

## 2020-07-14 MED ORDER — OXYCONTIN 20 MG PO T12A: 20.0000 mg | EXTENDED_RELEASE_TABLET | Freq: Two times a day (BID) | ORAL | 0 refills | Status: DC

## 2020-07-14 NOTE — Progress Notes (Signed)
Timothy Marks, is a 30 y.o. male  NVB:[id number]  TXH:[id number]  DOB - 07/04/1990  No chief complaint on file.      Subjective:   Timothy Marks is a 30 y.o. male with medical history of sickle cell disease, hemoglobin SS, major depression and vitamin D deficiency, opiate tolerance and dependence and anemia of chronic disease who presented here today for a routine follow up visit.  Patient has no new complaint today.  He is excited about his job.  He is up-to-date with his vaccinations.  He is adherent with his home medications which include hydroxyurea, gabapentin and folic acid p.o. pain medications of OxyContin and oxycodone.  Unfortunately patient continues to smoke cigarette and he is not ready to quit yet.  His pain is manageable at the moment.  He needs refill of his pain medications.  He denies any suicidal ideations or thoughts at this time. Patient has No headache, No chest pain, No abdominal pain - No Nausea, No new weakness tingling or numbness, No Cough - SOB.  No problems updated.  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 Authorizing Provider  cholecalciferol (VITAMIN D) 1000 units tablet Take 2 tablets (2,000 Units total) by mouth daily. 12/13/17   Azzie Glatter, FNP  folic acid  (FOLVITE) 1 MG tablet Take 1 tablet (1 mg total) by mouth daily. 05/07/20   Tresa Garter, MD  gabapentin (NEURONTIN) 300 MG capsule Take 1 capsule (300 mg total) by mouth 3 (three) times daily. 05/07/20   Tresa Garter, MD  hydroxyurea (HYDREA) 500 MG capsule Take 1 capsule (500 mg total) by mouth 2 (two) times daily. May take with food to minimize GI side effects. 05/07/20   Tresa Garter, MD  hydrOXYzine (ATARAX/VISTARIL) 10 MG tablet Take 1 tablet (10 mg total) by mouth 3 (three) times daily as needed for itching. 01/16/20   Tresa Garter, MD  Nutritional Supplements (ENSURE ACTIVE HIGH PROTEIN) LIQD Take 1 each by mouth 3 (three) times daily between meals. #90 cans of Ensure Patient taking differently: Take 1 each by mouth See admin instructions. 5 times daily 11/13/18   Dorena Dew, FNP  oxyCODONE-acetaminophen (PERCOCET) 10-325 MG tablet Take 1 tablet by mouth every 6 (six) hours as needed for up to 15 days for pain. 07/16/20 07/31/20  Tresa Garter, MD  OXYCONTIN 20 MG 12 hr tablet Take 1 tablet (20 mg total) by mouth every 12 (twelve) hours. 07/16/20 08/15/20  Tresa Garter, MD  rivaroxaban (XARELTO) 20 MG TABS tablet Take 1 tablet (20 mg total) by mouth daily with supper. 06/24/20   Tresa Garter, MD    Objective:  There were no vitals filed for this visit. Exam General appearance : Awake, alert, not  in any distress. Speech Clear. Not toxic looking HEENT: Atraumatic and Normocephalic, pupils equally reactive to light and accomodation Neck: Supple, no JVD. No cervical lymphadenopathy.  Chest: Good air entry bilaterally, no added sounds  CVS: S1 S2 regular, no murmurs.  Abdomen: Bowel sounds present, Non tender and not distended with no gaurding, rigidity or rebound. Extremities: B/L Lower Ext shows no edema, both legs are warm to touch Neurology: Awake alert, and oriented X 3, CN II-XII intact, Non focal Skin: No Rash  Data Review No results  found for: HGBA1C  Assessment & Plan   1. Chronic pain syndrome Refill - OXYCONTIN 20 MG 12 hr tablet; Take 1 tablet (20 mg total) by mouth every 12 (twelve) hours.  Dispense: 60 tablet; Refill: 0 - oxyCODONE-acetaminophen (PERCOCET) 10-325 MG tablet; Take 1 tablet by mouth every 6 (six) hours as needed for up to 15 days for pain.  Dispense: 60 tablet; Refill: 0  2. Hb-SS disease without crisis ([place]) Continue Hydrea. We discussed the need for good hydration, monitoring of hydration status, avoidance of heat, cold, stress, and infection triggers. We discussed the risks and benefits of Hydrea, including bone marrow suppression, the possibility of GI upset, skin ulcers, hair thinning, and teratogenicity. The patient was reminded of the need to seek medical attention for any symptoms of bleeding, anemia, or infection. Continue folic acid 1 mg daily to prevent aplastic bone marrow crises  3.  History of DVT Continue Xarelto  Patient have been counseled extensively about nutrition and exercise. Other issues discussed during this visit include: low cholesterol diet, foot care, annual eye examinations at Ophthalmology, importance of adherence with medications and regular follow-up.   Return in about 2 months (around 09/13/2020) for Routine Follow Up, Sickle Cell Disease/Pain.  The patient was given clear instructions to go to ER or return to medical center if symptoms don't improve, worsen or new problems develop. The patient verbalized understanding. The patient was told to call to get lab results if they haven't heard anything in the next week.   This note has been created with Surveyor, quantity. Any transcriptional errors are unintentional.    Angelica Chessman, MD, MHA, Karilyn Cota, [place], [place]   07/14/2020, 10:46 AM

## 2020-07-14 NOTE — Patient Instructions (Signed)
Sickle Cell Anemia, Adult  Sickle cell anemia is a condition in which red blood cells have an abnormal "sickle" shape. Red blood cells carry oxygen through the body. Sickle-shaped red blood cells do not live as long as normal red blood cells. They also clump together and block blood from flowing through the blood vessels. This condition prevents the body from getting enough oxygen. Sickle cell anemia causes organ damage and pain. It also increases the risk of infection. What are the causes? This condition is caused by a gene that is passed from parent to child (inherited). Receiving two copies of the gene causes the disease. Receiving one copy causes the "trait," which means that symptoms are milder or not present. What increases the risk? This condition is more likely to develop if your ancestors were from [place], the [place], [place] or [place], the [place], [place], or the [place]. What are the signs or symptoms? Symptoms of this condition include:  Episodes of pain (crises), especially in the hands and feet, joints, back, chest, or abdomen. The pain can be triggered by: ? An illness, especially if there is dehydration. ? Doing an activity with great effort (overexertion). ? Exposure to extreme temperature changes. ? High altitude.  Fatigue.  Shortness of breath or difficulty breathing.  Dizziness.  Pale skin or yellowed skin (jaundice).  Frequent bacterial infections.  Pain and swelling in the hands and feet (hand-food syndrome).  Prolonged, painful erection of the penis (priapism).  Acute chest syndrome. Symptoms of this include: ? Chest pain. ? Fever. ? Cough. ? Fast breathing.  Stroke.  Decreased activity.  Loss of appetite.  Change in behavior.  Headaches.  Seizures.  Vision changes.  Skin ulcers.  Heart disease.  High blood pressure.  Gallstones.  Liver and kidney problems. How is this diagnosed? This condition is diagnosed with  blood tests that check for the gene that causes this condition. How is this treated? There is no cure for most cases of this condition. Treatment focuses on managing your symptoms and preventing complications of the disease. Your health care provider will work with you to identify the best treatment options for you based on an assessment of your condition. Treatment may include:  Medicines, including: ? Pain medicines. ? Antibiotic medicines for infection. ? Medicines to increase the production of a protein in red blood cells that helps carry oxygen in the body (hemoglobin).  Fluids to treat pain and swelling.  Oxygen to treat acute chest syndrome.  Blood transfusions to treat symptoms such as fatigue, stroke, and acute chest syndrome.  Massage and physical therapy for pain.  Regular tests to monitor your condition, such as blood tests, X-rays, CT scans, MRI scans, ultrasounds, and lung function tests. These should be done every 3-12 months, depending on your age.  Hematopoietic stem cell transplant. This is a procedure to replace abnormal stem cells with healthy stem cells from a donor's bone marrow. Stem cells are cells that can develop into blood cells, and bone marrow is the spongy tissue inside the bones. Follow these instructions at home: Medicines  Take over-the-counter and prescription medicines only as told by your health care provider.  If you were prescribed an antibiotic medicine, take it as told by your health care provider. Do not stop taking the antibiotic even if you start to feel better.  If you develop a fever, do not take medicines to reduce the fever right away. This could cover up another problem. Notify your health care provider. Managing  pain, stiffness, and swelling  Try these methods to help ease your pain: ? Using a heating pad. ? Taking a warm bath. ? Distracting yourself, such as by watching TV. Eating and drinking  Drink enough fluid to keep your urine  clear or pale yellow. Drink more in hot weather and during exercise.  Limit or avoid drinking alcohol.  Eat a balanced and nutritious diet. Eat plenty of fruits, vegetables, whole grains, and lean protein.  Take vitamins and supplements as directed by your health care provider. Traveling  When traveling, keep these with you: ? Your medical information. ? The names of your health care providers. ? Your medicines.  If you have to travel by air, ask about precautions you should take. Activity  Get plenty of rest.  Avoid activities that will lower your oxygen levels, such as exercising vigorously. General instructions  Do not use any products that contain nicotine or tobacco, such as cigarettes and e-cigarettes. They lower blood oxygen levels. If you need help quitting, ask your health care provider.  Consider wearing a medical alert bracelet.  Avoid high altitudes.  Avoid extreme temperatures and extreme temperature changes.  Keep all follow-up visits as told by your health care provider. This is important. Contact a health care provider if:  You develop joint pain.  Your feet or hands swell or have pain.  You have fatigue. Get help right away if:  You have symptoms of infection. These include: ? Fever. ? Chills. ? Extreme tiredness. ? Irritability. ? Poor eating. ? Vomiting.  You feel dizzy or faint.  You have new abdominal pain, especially on the left side near the stomach area.  You develop priapism.  You have numbness in your arms or legs or have trouble moving them.  You have trouble talking.  You develop pain that cannot be controlled with medicine.  You become short of breath.  You have rapid breathing.  You have a persistent cough.  You have pain in your chest.  You develop a severe headache or stiff neck.  You feel bloated without eating or after eating a small amount of food.  Your skin is pale.  You suddenly lose  vision. Summary  Sickle cell anemia is a condition in which red blood cells have an abnormal "sickle" shape. This disease can cause organ damage and chronic pain, and it can raise your risk of infection.  Sickle cell anemia is a genetic disorder.  Treatment focuses on managing your symptoms and preventing complications of the disease.  Get medical help right away if you have any signs of infection, such as a fever. This information is not intended to replace advice given to you by your health care provider. Make sure you discuss any questions you have with your health care provider. Document Revised: 06/27/2019 Document Reviewed: 06/27/2019 Elsevier Patient Education  [employer].

## 2020-07-17 ENCOUNTER — Other Ambulatory Visit: Payer: Self-pay | Admitting: Internal Medicine

## 2020-07-24 ENCOUNTER — Encounter: Payer: Self-pay | Admitting: Internal Medicine

## 2020-07-26 ENCOUNTER — Other Ambulatory Visit: Payer: Self-pay

## 2020-07-26 DIAGNOSIS — G894 Chronic pain syndrome: Secondary | ICD-10-CM

## 2020-07-26 DIAGNOSIS — D571 Sickle-cell disease without crisis: Secondary | ICD-10-CM

## 2020-07-28 ENCOUNTER — Telehealth: Payer: Self-pay | Admitting: Internal Medicine

## 2020-07-28 ENCOUNTER — Other Ambulatory Visit: Payer: Self-pay | Admitting: Internal Medicine

## 2020-07-29 ENCOUNTER — Non-Acute Institutional Stay (HOSPITAL_COMMUNITY)
Admission: AD | Admit: 2020-07-29 | Discharge: 2020-07-29 | Disposition: A | Discharge status: Home / Self Care | Payer: Medicare Other | Source: Ambulatory Visit | Attending: Internal Medicine | Admitting: Internal Medicine

## 2020-07-29 ENCOUNTER — Telehealth (HOSPITAL_COMMUNITY): Payer: Self-pay | Admitting: General Practice

## 2020-07-29 DIAGNOSIS — Z880 Allergy status to penicillin: Secondary | ICD-10-CM | POA: Insufficient documentation

## 2020-07-29 DIAGNOSIS — D5700 Hb-SS disease with crisis, unspecified: Secondary | ICD-10-CM | POA: Diagnosis present

## 2020-07-29 DIAGNOSIS — Z881 Allergy status to other antibiotic agents status: Secondary | ICD-10-CM | POA: Insufficient documentation

## 2020-07-29 DIAGNOSIS — F17210 Nicotine dependence, cigarettes, uncomplicated: Secondary | ICD-10-CM | POA: Diagnosis not present

## 2020-07-29 DIAGNOSIS — Z79891 Long term (current) use of opiate analgesic: Secondary | ICD-10-CM | POA: Diagnosis not present

## 2020-07-29 DIAGNOSIS — Z7901 Long term (current) use of anticoagulants: Secondary | ICD-10-CM | POA: Diagnosis not present

## 2020-07-29 DIAGNOSIS — Z885 Allergy status to narcotic agent status: Secondary | ICD-10-CM | POA: Diagnosis not present

## 2020-07-29 DIAGNOSIS — G894 Chronic pain syndrome: Secondary | ICD-10-CM | POA: Diagnosis not present

## 2020-07-29 DIAGNOSIS — Z79899 Other long term (current) drug therapy: Secondary | ICD-10-CM | POA: Diagnosis not present

## 2020-07-29 DIAGNOSIS — Z886 Allergy status to analgesic agent status: Secondary | ICD-10-CM | POA: Diagnosis not present

## 2020-07-29 DIAGNOSIS — D638 Anemia in other chronic diseases classified elsewhere: Secondary | ICD-10-CM | POA: Diagnosis not present

## 2020-07-29 LAB — COMPREHENSIVE METABOLIC PANEL
ALT: 10 U/L (ref 0–44)
AST: 16 U/L (ref 15–41)
Albumin: 4.3 g/dL (ref 3.5–5.0)
Alkaline Phosphatase: 68 U/L (ref 38–126)
Anion gap: 4 — ABNORMAL LOW (ref 5–15)
BUN: 7 mg/dL (ref 6–20)
CO2: 26 mmol/L (ref 22–32)
Calcium: 9 mg/dL (ref 8.9–10.3)
Chloride: 106 mmol/L (ref 98–111)
Creatinine, Ser: 0.54 mg/dL — ABNORMAL LOW (ref 0.61–1.24)
GFR, Estimated: >60 mL/min (ref >60)
Glucose, Bld: 88 mg/dL (ref 70–99)
Potassium: 3.3 mmol/L — ABNORMAL LOW (ref 3.5–5.1)
Sodium: 136 mmol/L (ref 135–145)
Total Bilirubin: 2.7 mg/dL — ABNORMAL HIGH (ref 0.3–1.2)
Total Protein: 7.3 g/dL (ref 6.5–8.1)

## 2020-07-29 LAB — CBC WITH DIFFERENTIAL/PLATELET
Abs Immature Granulocytes: 0.03 10*3/uL (ref 0.00–0.07)
Basophils Absolute: 0 10*3/uL (ref 0.0–0.1)
Basophils Relative: 0 %
Eosinophils Absolute: 0.1 10*3/uL (ref 0.0–0.5)
Eosinophils Relative: 1 %
HCT: 27 % — ABNORMAL LOW (ref 39.0–52.0)
Hemoglobin: 9.8 g/dL — ABNORMAL LOW (ref 13.0–17.0)
Immature Granulocytes: 0 %
Lymphocytes Relative: 26 %
Lymphs Abs: 1.9 10*3/uL (ref 0.7–4.0)
MCH: 35.3 pg — ABNORMAL HIGH (ref 26.0–34.0)
MCHC: 36.3 g/dL — ABNORMAL HIGH (ref 30.0–36.0)
MCV: 97.1 fL (ref 80.0–100.0)
Monocytes Absolute: 0.7 10*3/uL (ref 0.1–1.0)
Monocytes Relative: 9 %
Neutro Abs: 4.8 10*3/uL (ref 1.7–7.7)
Neutrophils Relative %: 64 %
Platelets: 414 10*3/uL — ABNORMAL HIGH (ref 150–400)
RBC: 2.78 MIL/uL — ABNORMAL LOW (ref 4.22–5.81)
RDW: 14.6 % (ref 11.5–15.5)
WBC: 7.5 10*3/uL (ref 4.0–10.5)
nRBC: 1.6 % — ABNORMAL HIGH (ref 0.0–0.2)

## 2020-07-29 LAB — RAPID URINE DRUG SCREEN, HOSP PERFORMED
Amphetamines: NOT DETECTED
Barbiturates: NOT DETECTED
Benzodiazepines: NOT DETECTED
Cocaine: NOT DETECTED
Opiates: POSITIVE — AB
Tetrahydrocannabinol: POSITIVE — AB

## 2020-07-29 MED ORDER — SODIUM CHLORIDE 0.9% FLUSH: 9.0000 mL | INTRAVENOUS | Status: DC | PRN

## 2020-07-29 MED ORDER — ONDANSETRON HCL 4 MG/2ML IJ SOLN
4.0000 mg | Freq: Four times a day (QID) | INTRAMUSCULAR | Status: DC | PRN
  Administered 2020-07-29: 4 mg via INTRAVENOUS
  Filled 2020-07-29: qty 2

## 2020-07-29 MED ORDER — HYDROMORPHONE 1 MG/ML IV SOLN
INTRAVENOUS | Status: DC
  Administered 2020-07-29: 30 mg via INTRAVENOUS
  Administered 2020-07-29: 7 mg via INTRAVENOUS
  Filled 2020-07-29: qty 30

## 2020-07-29 MED ORDER — DIPHENHYDRAMINE HCL 25 MG PO CAPS
25.0000 mg | ORAL_CAPSULE | ORAL | Status: DC | PRN
  Administered 2020-07-29 (×2): 25 mg via ORAL
  Filled 2020-07-29 (×2): qty 1

## 2020-07-29 MED ORDER — SODIUM CHLORIDE 0.45 % IV SOLN: INTRAVENOUS | Status: DC

## 2020-07-29 MED ORDER — NALOXONE HCL 0.4 MG/ML IJ SOLN: 0.4000 mg | INTRAMUSCULAR | Status: DC | PRN

## 2020-07-29 MED ORDER — ACETAMINOPHEN 500 MG PO TABS
1000.0000 mg | ORAL_TABLET | Freq: Once | ORAL | Status: AC
  Administered 2020-07-29: 1000 mg via ORAL
  Filled 2020-07-29: qty 2

## 2020-07-29 MED ORDER — KETOROLAC TROMETHAMINE 30 MG/ML IJ SOLN
15.0000 mg | Freq: Once | INTRAMUSCULAR | Status: AC
  Administered 2020-07-29: 15 mg via INTRAVENOUS
  Filled 2020-07-29: qty 1

## 2020-07-29 NOTE — Telephone Encounter (Signed)
Patient called, requesting to come to the day hospital due to pain in the lower back  rated at 10/10. Denied chest pain, fever, diarrhea, abdominal pain, nausea/vomitting and priapism. Screened negative for Covid-19 symptoms. Admitted to having means of transportation without driving self after treatment. Last took  800 mg at 09:00 am today. Per provider, patient can come to the day hospital for treatment. Patient notified, verbalized understanding.

## 2020-07-29 NOTE — Progress Notes (Signed)
Pt admitted to the day hospital for treatment of sickle cell pain crisis. Pt reported pain to back rated a 10/10. Pt given PO Benadryl and Tylenol, IV zofran and Toradol,placed on Dilaudid PCA, and hydrated with IV fluids. At discharge patient reported pain a 5/10. Discharge instructions given to pt. Pt alert , oriented and ambulatory at discharge.

## 2020-07-29 NOTE — H&P (Signed)
[facility] History and Physical   Date: 07/29/2020  Patient name: Timothy Marks Medical record number: [id number] Date of birth: 05/06/1990 Age: 30 y.o. Gender: male PCP: Tresa Garter, MD  Attending physician: Tresa Garter, MD  Chief Complaint: Sickle cell pain  History of Present Illness: Timothy Marks is a 30 year old male with a medical history significant for sickle cell disease, chronic pain syndrome,opiate dependence and tolerance, and history of anemia of chronic disease presents with complaints of low back pain that is consistent with his typical sickle cell crisis.  Patient says that pain intensity has been elevated over the past several days.  He attributes pain crisis to running out his short acting pain medication.  Patient says that he has been "doubling up" on his medications to control pain.  He last had Percocet 2 days ago with very little improvement.  He currently rates pain as 9/10 and characterizes it as constant and aching.  He denies any fever, chills, chest pain, shortness of breath, headache, urinary symptoms, nausea, vomiting, or diarrhea.  No sick contacts, recent travel, or exposure to COVID-19.   Meds: Medications Prior to Admission  Medication Sig Dispense Refill Last Dose   cholecalciferol (VITAMIN D) 1000 units tablet Take 2 tablets (2,000 Units total) by mouth daily. 30 tablet 6    folic acid (FOLVITE) 1 MG tablet Take 1 tablet (1 mg total) by mouth daily. 90 tablet 2    gabapentin (NEURONTIN) 300 MG capsule Take 1 capsule (300 mg total) by mouth 3 (three) times daily. 90 capsule 5    hydroxyurea (HYDREA) 500 MG capsule Take 1 capsule (500 mg total) by mouth 2 (two) times daily. May take with food to minimize GI side effects. 180 capsule 2    hydrOXYzine (ATARAX/VISTARIL) 10 MG tablet Take 1 tablet (10 mg total) by mouth 3 (three) times daily as needed for itching. 30 tablet 0    Nutritional Supplements (ENSURE ACTIVE HIGH  PROTEIN) LIQD Take 1 each by mouth 3 (three) times daily between meals. #90 cans of Ensure (Patient taking differently: Take 1 each by mouth See admin instructions. 5 times daily) 237 mL 11    oxyCODONE-acetaminophen (PERCOCET) 10-325 MG tablet Take 1 tablet by mouth every 6 (six) hours as needed for up to 15 days for pain. 60 tablet 0    OXYCONTIN 20 MG 12 hr tablet Take 1 tablet (20 mg total) by mouth every 12 (twelve) hours. 60 tablet 0    rivaroxaban (XARELTO) 20 MG TABS tablet Take 1 tablet (20 mg total) by mouth daily with supper. 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   Sickle cell anemia (HCC)    Vitamin D deficiency y-6   Past Surgical History:  Procedure Laterality Date   CHOLECYSTECTOMY     Family History  Problem Relation Age of Onset   Sickle cell trait Mother    Sickle cell trait Father    Social History   Socioeconomic History   Marital status: Single    Spouse name: Not on file   Number of children: Not on file   Years of education: Not on file   Highest education level: Not on file  Occupational History   Not on file  Tobacco Use   Smoking status: Every Day    Pack years: 0.00    Types: Cigarettes   Smokeless tobacco: Never  Vaping Use   Vaping Use: Never used  Substance and Sexual Activity   Alcohol use: No   Drug use: Not Currently    Types: Marijuana   Sexual activity: Yes  Other Topics Concern   Not on file  Social History Narrative   Not on file   Social Determinants of Health   Financial Resource Strain: Not on file  Food Insecurity: Not on file  Transportation Needs: Not on file   Physical Activity: Not on file  Stress: Not on file  Social Connections: Not on file  Intimate Partner Violence: Not on file   Review of Systems  Constitutional: Negative.   HENT: Negative.    Eyes: Negative.   Respiratory: Negative.    Cardiovascular: Negative.   Gastrointestinal: Negative.   Genitourinary: Negative.   Musculoskeletal:  Positive for back pain and joint pain.  Skin: Negative.   Neurological: Negative.   Endo/Heme/Allergies: Negative.   Psychiatric/Behavioral: Negative.     Physical Exam: There were no vitals taken for this visit.  Physical Exam Constitutional:      Appearance: Normal appearance.  HENT:     Mouth/Throat:     Mouth: Mucous membranes are moist.  Eyes:     Pupils: Pupils are equal, round, and reactive to light.  Cardiovascular:     Rate and Rhythm: Normal rate and regular rhythm.  Abdominal:     General: Abdomen is flat. Bowel sounds are normal.  Musculoskeletal:        General: Normal range of motion.  Skin:    General: Skin is warm.  Neurological:     General: No focal deficit present.     Mental Status: He is alert. Mental status is at baseline.  Psychiatric:        Mood and Affect: Mood normal.        Behavior: Behavior normal.        Thought Content: Thought content normal.        Judgment: Judgment normal.    Lab results: No results found for this or any previous visit (from the past 24 hour(s)).  Imaging results:  No results found.   Assessment & Plan: Patient admitted to sickle cell day infusion center for management of pain crisis.  Patient is opiate tolerant Initiate IV dilaudid PCA IV fluids, 0.45% saline at 100 ml/hr Toradol 15 mg IV times one dose Tylenol 1000 mg by mouth times one dose Review CBC with differential, complete metabolic panel, and reticulocytes as results become available. Pain intensity will be reevaluated in context of functioning and relationship to baseline as care progresses If pain  intensity remains elevated and/or sudden change in hemodynamic stability transition to inpatient services for higher level of care.    [name]  APRN, MSN, FNP-C [facility] [address] [place], [place] [address] [phone]   07/29/2020, 11:02 AM

## 2020-07-29 NOTE — Discharge Summary (Signed)
[facility] Discharge Summary   Patient ID: Timothy Marks MRN: [id number] DOB/AGE: 08-29-1990 30 y.o.  Admit date: 07/29/2020 Discharge date: 07/29/2020  Primary Care Physician:  Tresa Garter, MD  Admission Diagnoses:  Active Problems:   Sickle cell pain crisis [facility])   Discharge Medications:  Allergies as of 07/29/2020       Reactions   Buprenorphine Hcl Hives   Levaquin [levofloxacin] Itching   Meperidine Rash   Morphine Hives   Morphine And Related Hives   Toradol [ketorolac Tromethamine] Itching   Tramadol Hives   Vancomycin Itching   Zosyn [piperacillin Sod-tazobactam So] Itching, Rash, Other (See Comments)   Has taken rocephin in the past        Medication List     TAKE these medications    cholecalciferol 25 MCG (1000 UNIT) tablet Commonly known as: VITAMIN D Take 2 tablets (2,000 Units total) by mouth daily.   folic acid 1 MG tablet Commonly known as: FOLVITE Take 1 tablet (1 mg total) by mouth daily.   gabapentin 300 MG capsule Commonly known as: NEURONTIN Take 1 capsule (300 mg total) by mouth 3 (three) times daily.   hydroxyurea 500 MG capsule Commonly known as: HYDREA Take 1 capsule (500 mg total) by mouth 2 (two) times daily. May take with food to minimize GI side effects.   hydrOXYzine 10 MG tablet Commonly known as: ATARAX/VISTARIL Take 1 tablet (10 mg total) by mouth 3 (three) times daily as needed for itching.   oxyCODONE-acetaminophen 10-325 MG tablet Commonly known as: Percocet Take 1 tablet by mouth every 6 (six) hours as needed for up to 15 days for pain.   OxyCONTIN 20 mg 12 hr tablet Generic drug: oxyCODONE Take 1 tablet (20 mg total) by mouth every 12 (twelve) hours.   rivaroxaban 20 MG Tabs tablet Commonly known as: Xarelto Take 1 tablet (20 mg total) by mouth daily with supper.       ASK your doctor about these medications    Ensure Active High Protein Liqd Take 1 each by mouth 3 (three) times  daily between meals. #90 cans of Ensure         Consults:  None  Significant Diagnostic Studies:  No results found.  History of present illness:  Timothy Marks is a 30 year old male with a medical history significant for sickle cell disease, chronic pain syndrome,opiate dependence and tolerance, and history of anemia of chronic disease presents with complaints of low back pain that is consistent with his typical sickle cell crisis.  Patient says that pain intensity has been elevated over the past several days.  He attributes pain crisis to running out his short acting pain medication.  Patient says that he has been "doubling up" on his medications to control pain.  He last had Percocet 2 days ago with very little improvement.  He currently rates pain as 9/10 and characterizes it as constant and aching.  He denies any fever, chills, chest pain, shortness of breath, headache, urinary symptoms, nausea, vomiting, or diarrhea.  No sick contacts, recent travel, or exposure to COVID-19.    [facility] Course: Patient admitted to sickle cell day infusion clinic for further management of pain crisis. Reviewed all laboratory values, largely consistent with patient's baseline. Pain managed with IV Dilaudid PCA Toradol 15 mg x 1 Tylenol 1000 mg x 1 IV fluids, 0.45% saline at 100 mL/h Pain intensity decreased to 6/10.  He does not warrant inpatient transition.  He  is alert, oriented, and ambulating without assistance.  Patient will discharge home in a hemodynamically stable condition.  Discharge instructions:  Resume all home medications.   Follow up with PCP as previously  scheduled.   Discussed the importance of drinking 64 ounces of water daily, dehydration of red blood cells may lead further sickling.   Avoid all stressors that precipitate sickle cell pain crisis.     The patient was given clear instructions to go to ER or return to medical center if symptoms do not  improve, worsen or new problems develop.    Physical Exam at Discharge:  BP 120/60 (BP Location: Right Arm)   Pulse 80   Temp 98.1 F (36.7 C) (Temporal)   Resp 14   SpO2 100%   Physical Exam Constitutional:      Appearance: Normal appearance.  Eyes:     Pupils: Pupils are equal, round, and reactive to light.  Cardiovascular:     Rate and Rhythm: Normal rate and regular rhythm.  Pulmonary:     Effort: Pulmonary effort is normal.  Abdominal:     General: Bowel sounds are normal.  Musculoskeletal:        General: Normal range of motion.  Skin:    General: Skin is warm.  Neurological:     General: No focal deficit present.     Mental Status: He is alert. Mental status is at baseline.  Psychiatric:        Mood and Affect: Mood normal.        Behavior: Behavior normal.        Thought Content: Thought content normal.        Judgment: Judgment normal.     Disposition at Discharge: Discharge disposition: 01-Home or Self Care       Discharge Orders:   Condition at Discharge:   Stable  Time spent on Discharge:  Greater than 30 minutes.  Signed: [name]  APRN, MSN, FNP-C [facility] [address] [place], [place] [address] [phone]  07/29/2020, 5:57 PM

## 2020-07-30 ENCOUNTER — Other Ambulatory Visit: Payer: Self-pay

## 2020-07-30 ENCOUNTER — Telehealth: Payer: Self-pay

## 2020-07-30 DIAGNOSIS — D571 Sickle-cell disease without crisis: Secondary | ICD-10-CM

## 2020-07-30 DIAGNOSIS — G894 Chronic pain syndrome: Secondary | ICD-10-CM

## 2020-07-30 NOTE — Telephone Encounter (Signed)
Oxycodone  Is DUE tomorrow per patient

## 2020-07-31 ENCOUNTER — Other Ambulatory Visit: Payer: Self-pay

## 2020-07-31 ENCOUNTER — Other Ambulatory Visit: Payer: Self-pay | Admitting: Internal Medicine

## 2020-07-31 ENCOUNTER — Telehealth: Payer: Self-pay | Admitting: Internal Medicine

## 2020-07-31 DIAGNOSIS — G894 Chronic pain syndrome: Secondary | ICD-10-CM

## 2020-07-31 DIAGNOSIS — D571 Sickle-cell disease without crisis: Secondary | ICD-10-CM

## 2020-07-31 MED ORDER — OXYCODONE-ACETAMINOPHEN 10-325 MG PO TABS
1.0000 | ORAL_TABLET | Freq: Four times a day (QID) | ORAL | 0 refills | Status: DC | PRN
Start: 2020-07-31 — End: 2020-08-14

## 2020-07-31 NOTE — Telephone Encounter (Signed)
Patient call about his  Oxycodone/Percpcert [phone]. I checked it is due today. Thanks

## 2020-07-31 NOTE — Telephone Encounter (Signed)
Sorry it was Oxycodone/Percocet 10-325

## 2020-07-31 NOTE — Telephone Encounter (Signed)
Sent to provider 

## 2020-08-04 ENCOUNTER — Other Ambulatory Visit: Payer: Self-pay

## 2020-08-04 DIAGNOSIS — G894 Chronic pain syndrome: Secondary | ICD-10-CM

## 2020-08-04 DIAGNOSIS — Z79891 Long term (current) use of opiate analgesic: Secondary | ICD-10-CM

## 2020-08-07 ENCOUNTER — Telehealth: Payer: Self-pay

## 2020-08-07 NOTE — Telephone Encounter (Signed)
Oxycontin

## 2020-08-10 ENCOUNTER — Other Ambulatory Visit: Payer: Self-pay

## 2020-08-10 ENCOUNTER — Other Ambulatory Visit: Payer: Self-pay | Admitting: Internal Medicine

## 2020-08-10 DIAGNOSIS — G894 Chronic pain syndrome: Secondary | ICD-10-CM

## 2020-08-10 DIAGNOSIS — Z79891 Long term (current) use of opiate analgesic: Secondary | ICD-10-CM

## 2020-08-12 ENCOUNTER — Telehealth: Payer: Self-pay

## 2020-08-12 ENCOUNTER — Other Ambulatory Visit: Payer: Self-pay

## 2020-08-12 DIAGNOSIS — Z79891 Long term (current) use of opiate analgesic: Secondary | ICD-10-CM

## 2020-08-12 DIAGNOSIS — D571 Sickle-cell disease without crisis: Secondary | ICD-10-CM

## 2020-08-12 DIAGNOSIS — G894 Chronic pain syndrome: Secondary | ICD-10-CM

## 2020-08-12 NOTE — Telephone Encounter (Signed)
Oxycodone  Oxycontin

## 2020-08-13 ENCOUNTER — Other Ambulatory Visit: Payer: Self-pay | Admitting: Internal Medicine

## 2020-08-14 ENCOUNTER — Other Ambulatory Visit: Payer: Self-pay | Admitting: Internal Medicine

## 2020-08-14 ENCOUNTER — Telehealth: Payer: Self-pay

## 2020-08-14 DIAGNOSIS — Z79891 Long term (current) use of opiate analgesic: Secondary | ICD-10-CM

## 2020-08-14 DIAGNOSIS — D571 Sickle-cell disease without crisis: Secondary | ICD-10-CM

## 2020-08-14 DIAGNOSIS — G894 Chronic pain syndrome: Secondary | ICD-10-CM

## 2020-08-14 MED ORDER — OXYCODONE-ACETAMINOPHEN 10-325 MG PO TABS: 1.0000 | ORAL_TABLET | Freq: Four times a day (QID) | ORAL | 0 refills | Status: DC | PRN

## 2020-08-14 MED ORDER — OXYCONTIN 20 MG PO T12A: 20.0000 mg | EXTENDED_RELEASE_TABLET | Freq: Two times a day (BID) | ORAL | 0 refills | Status: DC

## 2020-08-14 NOTE — Telephone Encounter (Signed)
Oxycodone Oxycontin

## 2020-08-28 ENCOUNTER — Telehealth: Payer: Self-pay

## 2020-08-28 NOTE — Telephone Encounter (Signed)
Oxycodone  °

## 2020-08-30 ENCOUNTER — Other Ambulatory Visit: Payer: Self-pay

## 2020-08-30 DIAGNOSIS — D571 Sickle-cell disease without crisis: Secondary | ICD-10-CM

## 2020-08-30 DIAGNOSIS — G894 Chronic pain syndrome: Secondary | ICD-10-CM

## 2020-08-31 ENCOUNTER — Other Ambulatory Visit: Payer: Self-pay | Admitting: Internal Medicine

## 2020-08-31 ENCOUNTER — Other Ambulatory Visit: Payer: Self-pay

## 2020-08-31 ENCOUNTER — Telehealth: Payer: Self-pay | Admitting: Internal Medicine

## 2020-08-31 DIAGNOSIS — G894 Chronic pain syndrome: Secondary | ICD-10-CM

## 2020-08-31 DIAGNOSIS — D571 Sickle-cell disease without crisis: Secondary | ICD-10-CM

## 2020-08-31 MED ORDER — OXYCODONE-ACETAMINOPHEN 10-325 MG PO TABS: 1.0000 | ORAL_TABLET | Freq: Four times a day (QID) | ORAL | 0 refills | Status: DC | PRN

## 2020-08-31 NOTE — Telephone Encounter (Signed)
Pt calling in requesting medication refill for oxyCODONE-acetaminophen (PERCOCET) 10-325 MG tablet  Asking to be sent to [place], [place]  Thank you.

## 2020-09-04 ENCOUNTER — Telehealth: Payer: Self-pay

## 2020-09-04 NOTE — Telephone Encounter (Signed)
Oxycontin

## 2020-09-07 ENCOUNTER — Other Ambulatory Visit: Payer: Self-pay

## 2020-09-07 DIAGNOSIS — Z79891 Long term (current) use of opiate analgesic: Secondary | ICD-10-CM

## 2020-09-07 DIAGNOSIS — G894 Chronic pain syndrome: Secondary | ICD-10-CM

## 2020-09-09 ENCOUNTER — Other Ambulatory Visit: Payer: Self-pay

## 2020-09-09 DIAGNOSIS — G894 Chronic pain syndrome: Secondary | ICD-10-CM

## 2020-09-09 DIAGNOSIS — Z79891 Long term (current) use of opiate analgesic: Secondary | ICD-10-CM

## 2020-09-11 ENCOUNTER — Other Ambulatory Visit: Payer: Self-pay | Admitting: Internal Medicine

## 2020-09-11 ENCOUNTER — Other Ambulatory Visit: Payer: Self-pay

## 2020-09-11 ENCOUNTER — Telehealth: Payer: Self-pay

## 2020-09-11 DIAGNOSIS — G894 Chronic pain syndrome: Secondary | ICD-10-CM

## 2020-09-11 DIAGNOSIS — Z79891 Long term (current) use of opiate analgesic: Secondary | ICD-10-CM

## 2020-09-11 DIAGNOSIS — D571 Sickle-cell disease without crisis: Secondary | ICD-10-CM

## 2020-09-11 NOTE — Telephone Encounter (Signed)
Oxycodone Oxy Contin 

## 2020-09-14 ENCOUNTER — Ambulatory Visit: Payer: Medicare Other | Admitting: Internal Medicine

## 2020-09-14 ENCOUNTER — Other Ambulatory Visit: Payer: Self-pay

## 2020-09-14 DIAGNOSIS — D571 Sickle-cell disease without crisis: Secondary | ICD-10-CM

## 2020-09-14 DIAGNOSIS — G894 Chronic pain syndrome: Secondary | ICD-10-CM

## 2020-09-15 ENCOUNTER — Other Ambulatory Visit: Payer: Self-pay

## 2020-09-15 ENCOUNTER — Other Ambulatory Visit: Payer: Self-pay | Admitting: Internal Medicine

## 2020-09-15 ENCOUNTER — Telehealth: Payer: Self-pay

## 2020-09-15 DIAGNOSIS — D571 Sickle-cell disease without crisis: Secondary | ICD-10-CM

## 2020-09-15 DIAGNOSIS — Z79891 Long term (current) use of opiate analgesic: Secondary | ICD-10-CM

## 2020-09-15 DIAGNOSIS — G894 Chronic pain syndrome: Secondary | ICD-10-CM

## 2020-09-15 MED ORDER — OXYCONTIN 20 MG PO T12A: 20.0000 mg | EXTENDED_RELEASE_TABLET | Freq: Two times a day (BID) | ORAL | 0 refills | Status: DC

## 2020-09-15 MED ORDER — OXYCODONE-ACETAMINOPHEN 10-325 MG PO TABS: 1.0000 | ORAL_TABLET | Freq: Four times a day (QID) | ORAL | 0 refills | Status: DC | PRN

## 2020-09-15 NOTE — Telephone Encounter (Signed)
Oxycodone Oxycontin

## 2020-09-23 ENCOUNTER — Ambulatory Visit: Payer: Self-pay | Admitting: Internal Medicine

## 2020-09-23 ENCOUNTER — Telehealth: Payer: Self-pay

## 2020-09-23 NOTE — Telephone Encounter (Signed)
Percocet refill

## 2020-09-24 ENCOUNTER — Telehealth (HOSPITAL_COMMUNITY): Payer: Self-pay

## 2020-09-24 ENCOUNTER — Other Ambulatory Visit: Payer: Self-pay | Admitting: Internal Medicine

## 2020-09-24 NOTE — Telephone Encounter (Signed)
Patient called in. Complains of pain in low back rates 9/10. Denied chest pain, abd pain, fever, N/V/D, priapism. Wants to come in for treatment. Last took Percocet 15/325 at 5:00am.  Pt states he was in the ED a few weeks ago. Pt states his mother is his transportation today. Pt denies exposure to anyone covid positive in last two weeks, denies any flu like symptoms today. Dr. Doreene Burke notified, pt not approved to come to day hospital, instructed pt to go to ED today. Pt made aware, verbalized understanding.

## 2020-09-28 ENCOUNTER — Telehealth: Payer: Self-pay

## 2020-09-28 ENCOUNTER — Other Ambulatory Visit: Payer: Self-pay | Admitting: Internal Medicine

## 2020-09-28 NOTE — Telephone Encounter (Signed)
Oxycodone  °

## 2020-09-30 ENCOUNTER — Other Ambulatory Visit: Payer: Self-pay

## 2020-09-30 ENCOUNTER — Encounter: Payer: Self-pay | Admitting: Internal Medicine

## 2020-09-30 ENCOUNTER — Ambulatory Visit (INDEPENDENT_AMBULATORY_CARE_PROVIDER_SITE_OTHER): Payer: Medicare Other | Admitting: Internal Medicine

## 2020-09-30 ENCOUNTER — Other Ambulatory Visit: Payer: Self-pay | Admitting: Internal Medicine

## 2020-09-30 VITALS — BP 117/73 | HR 61 | Temp 97.3°F | Ht 74.0 in | Wt 117.0 lb

## 2020-09-30 DIAGNOSIS — D5701 Hb-SS disease with acute chest syndrome: Secondary | ICD-10-CM | POA: Diagnosis not present

## 2020-09-30 DIAGNOSIS — D571 Sickle-cell disease without crisis: Secondary | ICD-10-CM | POA: Diagnosis not present

## 2020-09-30 DIAGNOSIS — G894 Chronic pain syndrome: Secondary | ICD-10-CM | POA: Diagnosis not present

## 2020-09-30 MED ORDER — OXYCODONE-ACETAMINOPHEN 10-325 MG PO TABS: 1.0000 | ORAL_TABLET | Freq: Four times a day (QID) | ORAL | 0 refills | Status: DC | PRN

## 2020-09-30 NOTE — Progress Notes (Signed)
Timothy Marks, is a 30 y.o. male  [id number]  [id number]  DOB - June 13, 1990  Chief Complaint  Patient presents with   Follow-up    Requesting oxycodone      Subjective:   Timothy Marks is a 30 y.o. male with history of sickle cell disease, hemoglobin SS, major depression and vitamin D deficiency, opiate tolerance and dependence and anemia of chronic disease who presented here today for a routine follow up visit. Patient has no new complaints today, only needs his pain medications refilled. He says he is doing very well at home. He is adherent with his home medications.  He is still smoking cigarette despite repeated counseling and education. Today patient has no complaints of pain.  He denies any suicidal ideations or thoughts.  He feels safe at home. Patient has No headache, No chest pain, No abdominal pain - No Nausea, No new weakness tingling or numbness, No Cough - SOB.  No problems updated.  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 Authorizing Provider  Acetaminophen-Codeine 300-30 MG tablet Take by mouth. 05/21/20  Yes [provider]  cholecalciferol (VITAMIN D) 1000 units tablet Take 2 tablets (2,000 Units total) by mouth daily. 12/13/17  Yes Azzie Glatter, FNP  folic acid  (FOLVITE) 1 MG tablet Take 1 tablet (1 mg total) by mouth daily. 05/07/20  Yes Tresa Garter, MD  gabapentin (NEURONTIN) 300 MG capsule Take 1 capsule (300 mg total) by mouth 3 (three) times daily. 05/07/20  Yes Tresa Garter, MD  hydroxyurea (HYDREA) 500 MG capsule Take 1 capsule (500 mg total) by mouth 2 (two) times daily. May take with food to minimize GI side effects. 05/07/20  Yes Tresa Garter, MD  hydrOXYzine (ATARAX/VISTARIL) 10 MG tablet Take 1 tablet (10 mg total) by mouth 3 (three) times daily as needed for itching. 01/16/20  Yes Tresa Garter, MD  melatonin 3 MG TABS tablet Take by mouth. 09/16/20 10/16/20 Yes [provider]  Nutritional Supplements (ENSURE ACTIVE HIGH PROTEIN) LIQD Take 1 each by mouth 3 (three) times daily between meals. #90 cans of Ensure Patient taking differently: Take 1 each by mouth See admin instructions. 5 times daily 11/13/18  Yes Dorena Dew, FNP  OXYCONTIN 20 MG 12 hr tablet Take 1 tablet (20 mg total) by mouth every 12 (twelve) hours. 09/15/20 10/15/20 Yes Deasiah Hagberg, Marlena Clipper, MD  potassium chloride (KLOR-CON) 10 MEQ tablet Take by mouth. 07/01/20  Yes [provider]  rivaroxaban (XARELTO) 20 MG TABS tablet Take 1 tablet (20 mg total) by mouth daily with supper. 06/24/20  Yes Tresa Garter, MD  oxyCODONE-acetaminophen (PERCOCET) 10-325 MG tablet Take 1 tablet by mouth every 6 (six) hours as needed for up to 15 days for  pain. 09/30/20 10/15/20  Tresa Garter, MD    Objective:   Vitals:   09/30/20 0913  BP: 117/73  Pulse: 61  Temp: (!) 97.3 F (36.3 C)  SpO2: 97%  Weight: 117 lb 0.4 oz (53.1 kg)  Height: '6\' 2"'$  (1.88 m)   Exam General appearance : Awake, alert, not in any distress. Speech Clear. Not toxic looking HEENT: Atraumatic and Normocephalic, pupils equally reactive to light and accomodation Neck: Supple, no JVD. No cervical lymphadenopathy.  Chest: Good air entry bilaterally, no added sounds   CVS: S1 S2 regular, no murmurs.  Abdomen: Bowel sounds present, Non tender and not distended with no gaurding, rigidity or rebound. Extremities: B/L Lower Ext shows no edema, both legs are warm to touch Neurology: Awake alert, and oriented X 3, CN II-XII intact, Non focal Skin: No Rash  Data Review No results found for: HGBA1C  Assessment & Plan   1. Chronic pain syndrome Refill - oxyCODONE-acetaminophen (PERCOCET) 10-325 MG tablet; Take 1 tablet by mouth every 6 (six) hours as needed for up to 15 days for pain.  Dispense: 60 tablet; Refill: 0  2. Hb-SS disease without crisis ([place]) Refill - oxyCODONE-acetaminophen (PERCOCET) 10-325 MG tablet; Take 1 tablet by mouth every 6 (six) hours as needed for up to 15 days for pain.  Dispense: 60 tablet; Refill: 0  Continue Hydrea. We discussed the need for good hydration, monitoring of hydration status, avoidance of heat, cold, stress, and infection triggers. We discussed the risks and benefits of Hydrea, including bone marrow suppression, the possibility of GI upset, skin ulcers, hair thinning, and teratogenicity. The patient was reminded of the need to seek medical attention for any symptoms of bleeding, anemia, or infection. Continue folic acid 1 mg daily to prevent aplastic bone marrow crises.  Patient have been counseled extensively about nutrition and exercise. Other issues discussed during this visit include: low cholesterol diet, foot care, annual eye examinations at Ophthalmology, importance of adherence with medications and regular follow-up.   Return in about 2 months (around 11/30/2020), or if symptoms worsen or fail to improve, for Sickle Cell Disease/Pain.  The patient was given clear instructions to go to ER or return to medical center if symptoms don't improve, worsen or new problems develop. The patient verbalized understanding. The patient was told to call to get lab results if they haven't heard anything in the next week.   This  note has been created with Survey[employer], quantity. Any transcriptional errors are unintentional.    Angelica Chessman, MD, MHA, Karilyn Cota, [place] [facility] [place], [place]   09/30/2020, 10:43 AM

## 2020-10-12 ENCOUNTER — Telehealth: Payer: Self-pay

## 2020-10-12 NOTE — Telephone Encounter (Signed)
Percocet Oxy Contin

## 2020-10-13 ENCOUNTER — Other Ambulatory Visit (HOSPITAL_COMMUNITY): Payer: Self-pay | Admitting: Internal Medicine

## 2020-10-14 ENCOUNTER — Other Ambulatory Visit: Payer: Self-pay

## 2020-10-14 ENCOUNTER — Other Ambulatory Visit (HOSPITAL_COMMUNITY): Payer: Self-pay | Admitting: Internal Medicine

## 2020-10-14 DIAGNOSIS — G894 Chronic pain syndrome: Secondary | ICD-10-CM

## 2020-10-14 DIAGNOSIS — D571 Sickle-cell disease without crisis: Secondary | ICD-10-CM

## 2020-10-14 DIAGNOSIS — Z79891 Long term (current) use of opiate analgesic: Secondary | ICD-10-CM

## 2020-10-14 MED ORDER — OXYCODONE-ACETAMINOPHEN 10-325 MG PO TABS: 1.0000 | ORAL_TABLET | Freq: Four times a day (QID) | ORAL | 0 refills | Status: DC | PRN

## 2020-10-14 MED ORDER — OXYCONTIN 20 MG PO T12A: 20.0000 mg | EXTENDED_RELEASE_TABLET | Freq: Two times a day (BID) | ORAL | 0 refills | Status: DC

## 2020-10-20 ENCOUNTER — Other Ambulatory Visit: Payer: Self-pay | Admitting: Internal Medicine

## 2020-10-26 ENCOUNTER — Other Ambulatory Visit: Payer: Self-pay | Admitting: Internal Medicine

## 2020-10-26 ENCOUNTER — Other Ambulatory Visit: Payer: Self-pay

## 2020-10-26 DIAGNOSIS — D571 Sickle-cell disease without crisis: Secondary | ICD-10-CM

## 2020-10-26 DIAGNOSIS — G894 Chronic pain syndrome: Secondary | ICD-10-CM

## 2020-10-28 ENCOUNTER — Telehealth: Payer: Self-pay

## 2020-10-28 NOTE — Telephone Encounter (Signed)
Patient called to schedule AWV, no answer, mailbox is full.

## 2020-10-29 ENCOUNTER — Encounter (HOSPITAL_COMMUNITY): Payer: Self-pay | Admitting: Emergency Medicine

## 2020-10-29 ENCOUNTER — Emergency Department (HOSPITAL_COMMUNITY)
Admission: EM | Admit: 2020-10-29 | Discharge: 2020-10-29 | Disposition: A | Discharge status: Home / Self Care | Payer: Medicare Other | Attending: Emergency Medicine | Admitting: Emergency Medicine

## 2020-10-29 ENCOUNTER — Telehealth (HOSPITAL_COMMUNITY): Payer: Self-pay | Admitting: *Deleted

## 2020-10-29 ENCOUNTER — Other Ambulatory Visit: Payer: Self-pay

## 2020-10-29 ENCOUNTER — Other Ambulatory Visit: Payer: Self-pay | Admitting: Internal Medicine

## 2020-10-29 DIAGNOSIS — D5700 Hb-SS disease with crisis, unspecified: Secondary | ICD-10-CM | POA: Diagnosis present

## 2020-10-29 DIAGNOSIS — F17210 Nicotine dependence, cigarettes, uncomplicated: Secondary | ICD-10-CM | POA: Insufficient documentation

## 2020-10-29 DIAGNOSIS — Z7901 Long term (current) use of anticoagulants: Secondary | ICD-10-CM | POA: Insufficient documentation

## 2020-10-29 DIAGNOSIS — Z8616 Personal history of COVID-19: Secondary | ICD-10-CM | POA: Diagnosis not present

## 2020-10-29 LAB — BASIC METABOLIC PANEL
Anion gap: 6 (ref 5–15)
BUN: 6 mg/dL (ref 6–20)
CO2: 28 mmol/L (ref 22–32)
Calcium: 9.7 mg/dL (ref 8.9–10.3)
Chloride: 106 mmol/L (ref 98–111)
Creatinine, Ser: 0.63 mg/dL (ref 0.61–1.24)
GFR, Estimated: >60 mL/min (ref >60)
Glucose, Bld: 94 mg/dL (ref 70–99)
Potassium: 4.1 mmol/L (ref 3.5–5.1)
Sodium: 140 mmol/L (ref 135–145)

## 2020-10-29 LAB — CBC WITH DIFFERENTIAL/PLATELET
Abs Immature Granulocytes: 0.04 10*3/uL (ref 0.00–0.07)
Basophils Absolute: 0 10*3/uL (ref 0.0–0.1)
Basophils Relative: 0 %
Eosinophils Absolute: 0.5 10*3/uL (ref 0.0–0.5)
Eosinophils Relative: 5 %
HCT: 32.6 % — ABNORMAL LOW (ref 39.0–52.0)
Hemoglobin: 11.6 g/dL — ABNORMAL LOW (ref 13.0–17.0)
Immature Granulocytes: 0 %
Lymphocytes Relative: 29 %
Lymphs Abs: 3 10*3/uL (ref 0.7–4.0)
MCH: 31.4 pg (ref 26.0–34.0)
MCHC: 35.6 g/dL (ref 30.0–36.0)
MCV: 88.3 fL (ref 80.0–100.0)
Monocytes Absolute: 0.8 10*3/uL (ref 0.1–1.0)
Monocytes Relative: 8 %
Neutro Abs: 5.8 10*3/uL (ref 1.7–7.7)
Neutrophils Relative %: 58 %
Platelets: 448 10*3/uL — ABNORMAL HIGH (ref 150–400)
RBC: 3.69 MIL/uL — ABNORMAL LOW (ref 4.22–5.81)
RDW: 15.9 % — ABNORMAL HIGH (ref 11.5–15.5)
WBC: 10.1 10*3/uL (ref 4.0–10.5)
nRBC: 0.5 % — ABNORMAL HIGH (ref 0.0–0.2)

## 2020-10-29 LAB — RETICULOCYTES
Immature Retic Fract: 27.4 % — ABNORMAL HIGH (ref 2.3–15.9)
RBC.: 3.63 MIL/uL — ABNORMAL LOW (ref 4.22–5.81)
Retic Count, Absolute: 274.1 10*3/uL — ABNORMAL HIGH (ref 19.0–186.0)
Retic Ct Pct: 7.6 % — ABNORMAL HIGH (ref 0.4–3.1)

## 2020-10-29 MED ORDER — KETAMINE HCL 50 MG/5ML IJ SOSY
0.3000 mg/kg | PREFILLED_SYRINGE | Freq: Once | INTRAMUSCULAR | Status: AC
  Administered 2020-10-29: 18 mg via INTRAVENOUS
  Filled 2020-10-29: qty 5

## 2020-10-29 MED ORDER — HYDROMORPHONE HCL 2 MG/ML IJ SOLN
2.0000 mg | INTRAMUSCULAR | Status: AC
  Administered 2020-10-29: 2 mg via INTRAVENOUS
  Filled 2020-10-29: qty 1

## 2020-10-29 MED ORDER — DIPHENHYDRAMINE HCL 50 MG/ML IJ SOLN
25.0000 mg | Freq: Once | INTRAMUSCULAR | Status: AC
  Administered 2020-10-29: 25 mg via INTRAVENOUS
  Filled 2020-10-29: qty 1

## 2020-10-29 MED ORDER — HYDROMORPHONE HCL 2 MG/ML IJ SOLN: 2.0000 mg | INTRAMUSCULAR | Status: DC

## 2020-10-29 MED ORDER — DEXTROSE-NACL 5-0.45 % IV SOLN: INTRAVENOUS | Status: DC

## 2020-10-29 NOTE — ED Provider Notes (Signed)
[facility] DEPT Provider Note   CSN: [id number] Arrival date & time: 10/29/20  0849     History Chief Complaint  Patient presents with   Sickle Cell Pain Crisis    Timothy Marks is a 30 y.o. male.  Patient is a 30 year old male who has a history of sickle cell anemia who presents with sickle cell pain crises.  He states that he has had a 2-day history of worsening pain in his left foot and lower back which is his normal pain exacerbation areas.  He denies any chest pain or shortness of breath.  No fevers.  No nausea or vomiting.  He is taking his pain medicine at home without improvement in symptoms.      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   Sickle cell anemia (HCC)    Vitamin D deficiency y-6    Patient Active Problem List   Diagnosis Date Noted   Lab test positive for detection of COVID-19 virus 01/23/2020   Medication management 01/16/2020   Underweight 06/14/2019   Generalized weakness 03/31/2019   Sickle-cell crisis ([place]) 03/24/2019   SIRS (systemic inflammatory response syndrome) ([place]) 02/10/2019   Abnormal liver function 11/04/2018   Sickle cell anemia with crisis ([place]) 05/22/2018   Narcotic dependence ([place]) 02/10/2018   Acute bilateral low back pain without sciatica    Other chronic pain    Anemia    Socially inappropriate behavior 12/02/2017   Thrombocythemia    Physical deconditioning    Agitation    Chronic pain syndrome    Recurrent cold sores    Fever of unknown origin    Drug-seeking behavior 08/30/2017   Pain    Sickle cell crisis ([place]) 07/16/2017   Adjustment disorder with mixed disturbance of emotions and conduct    Bilateral pulmonary infiltrates on chest x-ray 11/21/2016   History of DVT (deep vein thrombosis) 11/17/2016   Suicidal ideations 10/11/2016    Reticulocytosis 10/09/2016   DVT (deep venous thrombosis) ([place]) 08/02/2016   Has difficulty accessing primary care provider for most visits 07/11/2015   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 ([place]) 06/29/2015   Hordeolum externum of left upper eyelid 05/31/2015   Major depressive disorder with current active episode 11/09/2014   Hb-SS disease with acute chest syndrome ([place]) 11/08/2014   Pneumonia of right upper lobe due to infectious organism 11/07/2014   Constipation 08/03/2014   Leukocytosis 06/30/2014   h/o Priapism 05/06/2014   Protein-calorie malnutrition, severe ([place]) 04/09/2014   Major depressive disorder, recurrent, severe without psychotic features ([place])    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, left shoulder ([place])    Vitamin D deficiency    Sickle cell anemia ([place]) 03/30/2014   Hyperbilirubinemia 03/30/2014   Hypokalemia 02/07/2014   Mandible fracture ([place]) 05/03/2013   Aggressive behavior 01/25/2013   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   Sickle cell anemia with pain ([place]) 01/24/2013   Sickle cell pain crisis ([place]) 01/24/2013   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 Authorizing Provider  cholecalciferol (VITAMIN D) 1000 units tablet Take 2 tablets (2,000 Units total) by mouth daily. 12/13/17  Yes Azzie Glatter, FNP  folic acid (FOLVITE) 1 MG tablet Take 1 tablet (1 mg total) by mouth daily. 05/07/20  Yes Tresa Garter, MD  gabapentin (NEURONTIN) 300 MG capsule Take 1 capsule (300 mg total) by mouth 3 (three) times daily. 05/07/20  Yes Tresa Garter, MD  hydroxyurea (HYDREA) 500 MG capsule Take 1 capsule (500 mg total) by mouth 2 (two) times daily. May take with food to minimize GI side effects. 05/07/20  Yes Tresa Garter, MD  hydrOXYzine (ATARAX/VISTARIL) 10 MG tablet Take 1 tablet (10 mg total) by mouth 3 (three) times daily as needed for itching. 01/16/20  Yes Tresa Garter, MD  Nutritional Supplements (ENSURE ACTIVE HIGH PROTEIN) LIQD Take 1 each by mouth 3 (three) times daily between meals. #90 cans of Ensure Patient taking differently: Take 1 each by mouth See admin instructions. 5 times daily 11/13/18  Yes Dorena Dew, FNP  oxyCODONE-acetaminophen (PERCOCET) 10-325 MG tablet Take 1 tablet by mouth every 6 (six) hours as needed for up to 15 days for pain. 10/15/20 10/30/20 Yes Jegede, Olugbemiga E, MD  OXYCONTIN 20 MG 12 hr tablet Take 1 tablet (20 mg total) by mouth every 12 (twelve) hours. 10/15/20 11/14/20 Yes Tresa Garter, MD  rivaroxaban (XARELTO) 20 MG TABS tablet Take 1 tablet (20 mg total) by mouth daily with supper. 06/24/20  Yes Tresa Garter, MD    Allergies    Buprenorphine hcl, Levaquin [levofloxacin], Meperidine, Morphine, Morphine and related, Toradol [ketorolac tromethamine], Tramadol, Vancomycin, and Zosyn [piperacillin sod-tazobactam so]  Review of Systems   Review of Systems  Constitutional:  Negative for chills, diaphoresis, fatigue and fever.  HENT:  Negative for congestion, rhinorrhea and sneezing.   Eyes: Negative.   Respiratory:  Negative for cough, chest tightness and shortness of breath.   Cardiovascular:  Negative for chest pain and leg swelling.  Gastrointestinal:  Negative for abdominal pain, blood in stool, diarrhea, nausea  and vomiting.  Genitourinary:  Negative for difficulty urinating, flank pain, frequency and hematuria.  Musculoskeletal:  Positive for arthralgias and back pain.  Skin:  Negative for rash.  Neurological:  Negative for dizziness, speech difficulty, weakness, numbness and headaches.   Physical Exam Updated Vital Signs BP 100/65 (BP Location: Right Arm)   Pulse 66   Temp 98.4 F (36.9 C) (Oral)   Resp 16   Wt 61.2 kg   SpO2 100%   BMI 17.33 kg/m   Physical Exam Constitutional:      Appearance: He is well-developed.  HENT:     Head: Normocephalic and atraumatic.  Eyes:     Pupils: Pupils are equal, round, and reactive to light.  Cardiovascular:     Rate and Rhythm: Normal rate and regular rhythm.     Heart sounds: Normal heart sounds.  Pulmonary:     Effort: Pulmonary effort is normal. No respiratory distress.     Breath sounds: Normal breath sounds. No wheezing or rales.  Chest:     Chest wall: No tenderness.  Abdominal:     General: Bowel sounds are normal.     Palpations: Abdomen is soft.     Tenderness: There is no abdominal tenderness. There is no guarding or rebound.  Musculoskeletal:        General: Normal range of motion.     Cervical back: Normal range of motion and neck supple.     Comments: No joint swelling, no warmth or erythema  Lymphadenopathy:     Cervical: No cervical adenopathy.  Skin:    General: Skin is warm and dry.     Findings: No rash.  Neurological:     Mental Status: He is alert and oriented to person, place, and time.    ED Results / Procedures / Treatments   Labs (all labs ordered are listed, but only abnormal results are displayed) Labs Reviewed  RETICULOCYTES - Abnormal; Notable for the following components:      Result Value   Retic Ct Pct 7.6 (*)    RBC. 3.63 (*)    Retic Count, Absolute 274.1 (*)    Immature Retic Fract 27.4 (*)    All other components within normal limits  CBC WITH DIFFERENTIAL/PLATELET - Abnormal; Notable for  the following components:   RBC 3.69 (*)    Hemoglobin 11.6 (*)    HCT 32.6 (*)    RDW 15.9 (*)    Platelets 448 (*)    nRBC 0.5 (*)    All other components within normal limits  BASIC METABOLIC PANEL    EKG None  Radiology No results found.  Procedures Procedures   Medications Ordered in ED Medications  dextrose 5 %-0.45 % sodium chloride infusion ( Intravenous New Bag/Given 10/29/20 1007)  HYDROmorphone (DILAUDID) injection 2 mg (2 mg Intravenous Given 10/29/20 1005)  diphenhydrAMINE (BENADRYL) injection 25 mg (25 mg Intravenous Given 10/29/20 1002)  ketamine 50 mg in normal saline 5 mL (10 mg/mL) syringe (18 mg Intravenous Given 10/29/20 1106)    ED Course  I have reviewed the triage vital signs and the nursing notes.  Pertinent labs & imaging results that were available during my care of the patient were reviewed by me and considered in my medical decision making (see chart for details).    MDM Rules/Calculators/A&P                           Patient is a 30 year old male who presents with pain exacerbation from his sickle cell anemia.  He does not have a fever.  No other suggestions of infection.  His blood work is stable.  No symptoms that would be more concerning for PE or acute chest syndrome.  He was given pain medications in the ED per protocol.  He said he was still having pain following completion of the protocol.  I advised him that we could give him a dose of oral medication but he declines this.  He states that he will take his regular pain medication when his doctor refills it tomorrow.  He declines admission. Final Clinical Impression(s) / ED Diagnoses Final diagnoses:  Sickle cell anemia with pain [facility])    Rx / DC Orders ED Discharge Orders     None        Malvin Johns, MD 10/29/20 1227

## 2020-10-29 NOTE — ED Notes (Signed)
Given sandwich and crackers.

## 2020-10-29 NOTE — ED Triage Notes (Signed)
Pt arrives POV w/ c/o SSC. Pt states pain is located in lower back and left foot. Patient states pain has been going on for 3 days now with no relief.

## 2020-10-29 NOTE — Telephone Encounter (Signed)
Patient called requesting to come to the day hospital for sickle cell pain. The day hospital can't accept additional patients due to staffing. Patient advised and expresses an understanding.

## 2020-10-30 ENCOUNTER — Other Ambulatory Visit: Payer: Self-pay | Admitting: Internal Medicine

## 2020-10-30 DIAGNOSIS — G894 Chronic pain syndrome: Secondary | ICD-10-CM

## 2020-10-30 DIAGNOSIS — D571 Sickle-cell disease without crisis: Secondary | ICD-10-CM

## 2020-10-30 MED ORDER — OXYCODONE-ACETAMINOPHEN 10-325 MG PO TABS: 1.0000 | ORAL_TABLET | Freq: Four times a day (QID) | ORAL | 0 refills | Status: DC | PRN

## 2020-11-11 ENCOUNTER — Telehealth: Payer: Self-pay

## 2020-11-11 NOTE — Telephone Encounter (Signed)
Percocet

## 2020-11-13 ENCOUNTER — Other Ambulatory Visit: Payer: Self-pay

## 2020-11-13 DIAGNOSIS — G894 Chronic pain syndrome: Secondary | ICD-10-CM

## 2020-11-13 DIAGNOSIS — D571 Sickle-cell disease without crisis: Secondary | ICD-10-CM

## 2020-11-16 ENCOUNTER — Other Ambulatory Visit: Payer: Self-pay | Admitting: Internal Medicine

## 2020-11-16 ENCOUNTER — Other Ambulatory Visit: Payer: Self-pay

## 2020-11-16 ENCOUNTER — Telehealth: Payer: Self-pay

## 2020-11-16 DIAGNOSIS — Z79891 Long term (current) use of opiate analgesic: Secondary | ICD-10-CM

## 2020-11-16 DIAGNOSIS — G894 Chronic pain syndrome: Secondary | ICD-10-CM

## 2020-11-16 DIAGNOSIS — D571 Sickle-cell disease without crisis: Secondary | ICD-10-CM

## 2020-11-16 MED ORDER — OXYCONTIN 20 MG PO T12A: 20.0000 mg | EXTENDED_RELEASE_TABLET | Freq: Two times a day (BID) | ORAL | 0 refills | Status: DC

## 2020-11-16 MED ORDER — OXYCODONE-ACETAMINOPHEN 10-325 MG PO TABS: 1.0000 | ORAL_TABLET | Freq: Four times a day (QID) | ORAL | 0 refills | Status: DC | PRN

## 2020-11-16 NOTE — Telephone Encounter (Signed)
Percocet  [facility] on [address]

## 2020-11-24 ENCOUNTER — Other Ambulatory Visit: Payer: Self-pay

## 2020-11-24 ENCOUNTER — Emergency Department (HOSPITAL_COMMUNITY): Payer: Medicare Other

## 2020-11-24 ENCOUNTER — Emergency Department (HOSPITAL_COMMUNITY)
Admission: EM | Admit: 2020-11-24 | Discharge: 2020-11-25 | Disposition: A | Discharge status: Home / Self Care | Payer: Medicare Other | Source: Home / Self Care | Attending: Emergency Medicine | Admitting: Emergency Medicine

## 2020-11-24 ENCOUNTER — Encounter (HOSPITAL_COMMUNITY): Payer: Self-pay

## 2020-11-24 DIAGNOSIS — Z87891 Personal history of nicotine dependence: Secondary | ICD-10-CM | POA: Insufficient documentation

## 2020-11-24 DIAGNOSIS — D57219 Sickle-cell/Hb-C disease with crisis, unspecified: Secondary | ICD-10-CM | POA: Insufficient documentation

## 2020-11-24 DIAGNOSIS — D5700 Hb-SS disease with crisis, unspecified: Secondary | ICD-10-CM | POA: Diagnosis not present

## 2020-11-24 DIAGNOSIS — Z7901 Long term (current) use of anticoagulants: Secondary | ICD-10-CM | POA: Insufficient documentation

## 2020-11-24 LAB — CBC WITH DIFFERENTIAL/PLATELET
Abs Immature Granulocytes: 0.04 10*3/uL (ref 0.00–0.07)
Basophils Absolute: 0 10*3/uL (ref 0.0–0.1)
Basophils Relative: 0 %
Eosinophils Absolute: 0.2 10*3/uL (ref 0.0–0.5)
Eosinophils Relative: 2 %
HCT: 28.5 % — ABNORMAL LOW (ref 39.0–52.0)
Hemoglobin: 10.1 g/dL — ABNORMAL LOW (ref 13.0–17.0)
Immature Granulocytes: 0 %
Lymphocytes Relative: 27 %
Lymphs Abs: 3.1 10*3/uL (ref 0.7–4.0)
MCH: 30.7 pg (ref 26.0–34.0)
MCHC: 35.4 g/dL (ref 30.0–36.0)
MCV: 86.6 fL (ref 80.0–100.0)
Monocytes Absolute: 0.9 10*3/uL (ref 0.1–1.0)
Monocytes Relative: 8 %
Neutro Abs: 7.3 10*3/uL (ref 1.7–7.7)
Neutrophils Relative %: 63 %
Platelets: 484 10*3/uL — ABNORMAL HIGH (ref 150–400)
RBC: 3.29 MIL/uL — ABNORMAL LOW (ref 4.22–5.81)
RDW: 16.7 % — ABNORMAL HIGH (ref 11.5–15.5)
WBC: 11.5 10*3/uL — ABNORMAL HIGH (ref 4.0–10.5)
nRBC: 0.3 % — ABNORMAL HIGH (ref 0.0–0.2)

## 2020-11-24 LAB — RETICULOCYTES
Immature Retic Fract: 32.2 % — ABNORMAL HIGH (ref 2.3–15.9)
RBC.: 3.29 MIL/uL — ABNORMAL LOW (ref 4.22–5.81)
Retic Count, Absolute: 251.4 10*3/uL — ABNORMAL HIGH (ref 19.0–186.0)
Retic Ct Pct: 7.6 % — ABNORMAL HIGH (ref 0.4–3.1)

## 2020-11-24 LAB — URINALYSIS, ROUTINE W REFLEX MICROSCOPIC
Bilirubin Urine: NEGATIVE
Glucose, UA: NEGATIVE mg/dL
Ketones, ur: NEGATIVE mg/dL
Nitrite: NEGATIVE
Protein, ur: NEGATIVE mg/dL
Specific Gravity, Urine: 1.006 (ref 1.005–1.030)
pH: 7 (ref 5.0–8.0)

## 2020-11-24 LAB — COMPREHENSIVE METABOLIC PANEL
ALT: 12 U/L (ref 0–44)
AST: 26 U/L (ref 15–41)
Albumin: 4.7 g/dL (ref 3.5–5.0)
Alkaline Phosphatase: 78 U/L (ref 38–126)
Anion gap: 7 (ref 5–15)
BUN: 8 mg/dL (ref 6–20)
CO2: 26 mmol/L (ref 22–32)
Calcium: 9.3 mg/dL (ref 8.9–10.3)
Chloride: 104 mmol/L (ref 98–111)
Creatinine, Ser: 0.7 mg/dL (ref 0.61–1.24)
GFR, Estimated: >60 mL/min (ref >60)
Glucose, Bld: 91 mg/dL (ref 70–99)
Potassium: 4 mmol/L (ref 3.5–5.1)
Sodium: 137 mmol/L (ref 135–145)
Total Bilirubin: 2.8 mg/dL — ABNORMAL HIGH (ref 0.3–1.2)
Total Protein: 8.4 g/dL — ABNORMAL HIGH (ref 6.5–8.1)

## 2020-11-24 MED ORDER — KETAMINE HCL 50 MG/5ML IJ SOSY
0.3000 mg/kg | PREFILLED_SYRINGE | Freq: Once | INTRAMUSCULAR | Status: AC
  Administered 2020-11-24: 18 mg via INTRAVENOUS
  Filled 2020-11-24: qty 5

## 2020-11-24 MED ORDER — DIPHENHYDRAMINE HCL 50 MG/ML IJ SOLN
25.0000 mg | Freq: Four times a day (QID) | INTRAMUSCULAR | Status: DC | PRN
  Administered 2020-11-24 (×2): 25 mg via INTRAVENOUS
  Filled 2020-11-24 (×2): qty 1

## 2020-11-24 MED ORDER — OXYCODONE HCL 5 MG PO TABS
15.0000 mg | ORAL_TABLET | Freq: Once | ORAL | Status: AC
  Administered 2020-11-24: 15 mg via ORAL
  Filled 2020-11-24: qty 3

## 2020-11-24 MED ORDER — HYDROMORPHONE HCL 2 MG/ML IJ SOLN
2.0000 mg | INTRAMUSCULAR | Status: AC
  Administered 2020-11-24: 2 mg via INTRAVENOUS
  Filled 2020-11-24: qty 1

## 2020-11-24 MED ORDER — SODIUM CHLORIDE 0.45 % IV SOLN
INTRAVENOUS | Status: DC
  Administered 2020-11-24: 1000 mL via INTRAVENOUS

## 2020-11-24 NOTE — ED Triage Notes (Signed)
Patient c/o sickle cell pain in his lower back x 2 days.

## 2020-11-24 NOTE — ED Provider Notes (Signed)
[facility] DEPT Provider Note   CSN: [id number] Arrival date & time: 11/24/20  1734     History Chief Complaint  Patient presents with   Sickle Cell Pain Crisis    Timothy Marks is a 30 y.o. male.  HPI Patient presents with low back pain.  Onset was a few days ago.  Since that time pain is sore, severe, diffuse, similar to multiple prior sickle cell pain crises.  He denies other atypical manifestations, including no abdominal pain, no chest pain, no vomiting, no fever, no weakness, no incontinence.  No relief with home narcotics including Percocet and oxycodone.    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   Sickle cell anemia (HCC)    Vitamin D deficiency y-6    Patient Active Problem List   Diagnosis Date Noted   Lab test positive for detection of COVID-19 virus 01/23/2020   Medication management 01/16/2020   Underweight 06/14/2019   Generalized weakness 03/31/2019   Sickle-cell crisis ([place]) 03/24/2019   SIRS (systemic inflammatory response syndrome) ([place]) 02/10/2019   Abnormal liver function 11/04/2018   Sickle cell anemia with crisis ([place]) 05/22/2018   Narcotic dependence ([place]) 02/10/2018   Acute bilateral low back pain without sciatica    Other chronic pain    Anemia    Socially inappropriate behavior 12/02/2017   Thrombocythemia    Physical deconditioning    Agitation    Chronic pain syndrome    Recurrent cold sores    Fever of unknown origin    Drug-seeking behavior 08/30/2017   Pain    Sickle cell crisis ([place]) 07/16/2017   Adjustment disorder with mixed disturbance of emotions and conduct    Bilateral pulmonary infiltrates on chest x-ray 11/21/2016   History of DVT (deep vein thrombosis) 11/17/2016   Suicidal ideations 10/11/2016   Reticulocytosis 10/09/2016   DVT (deep venous  thrombosis) ([place]) 08/02/2016   Has difficulty accessing primary care provider for most visits 07/11/2015   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 ([place]) 06/29/2015   Hordeolum externum of left upper eyelid 05/31/2015   Major depressive disorder with current active episode 11/09/2014   Hb-SS disease with acute chest syndrome ([name]) 11/08/2014   Pneumonia of right upper lobe due to infectious organism 11/07/2014   Constipation 08/03/2014   Leukocytosis 06/30/2014   h/o Priapism 05/06/2014   Protein-calorie malnutrition, severe ([place]) 04/09/2014   Major depressive disorder, recurrent, severe without psychotic features ([place])    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, left shoulder ([place])    Vitamin D deficiency    Sickle cell anemia ([place]) 03/30/2014   Hyperbilirubinemia 03/30/2014   Hypokalemia 02/07/2014   Mandible fracture ([place]) 05/03/2013   Aggressive behavior 01/25/2013   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   Sickle cell anemia with pain ([place]) 01/24/2013   Sickle cell pain crisis ([place]) 01/24/2013   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 Authorizing Provider  cholecalciferol (VITAMIN D) 1000 units tablet Take 2 tablets (2,000 Units total) by mouth daily. 12/13/17   Azzie Glatter, FNP  folic acid (FOLVITE) 1 MG tablet Take 1  tablet (1 mg total) by mouth daily. 05/07/20   Tresa Garter, MD  gabapentin (NEURONTIN) 300 MG capsule Take 1 capsule (300 mg total) by mouth 3 (three) times daily. 05/07/20   Tresa Garter, MD  hydroxyurea (HYDREA) 500 MG capsule Take 1 capsule (500 mg total) by mouth 2 (two) times daily. May take with food to minimize GI side effects. 05/07/20   Tresa Garter, MD  hydrOXYzine (ATARAX/VISTARIL) 10 MG tablet Take 1 tablet (10 mg total) by mouth 3 (three) times daily as needed for itching. 01/16/20   Tresa Garter, MD  Nutritional Supplements (ENSURE ACTIVE HIGH PROTEIN) LIQD Take 1 each by mouth 3 (three) times daily between meals. #90 cans of Ensure Patient taking differently: Take 1 each by mouth See admin instructions. 5 times daily 11/13/18   Dorena Dew, FNP  oxyCODONE-acetaminophen (PERCOCET) 10-325 MG tablet Take 1 tablet by mouth every 6 (six) hours as needed for up to 15 days for pain. 11/16/20 12/01/20  Tresa Garter, MD  OXYCONTIN 20 MG 12 hr tablet Take 1 tablet (20 mg total) by mouth every 12 (twelve) hours. 11/16/20 12/16/20  Tresa Garter, MD  rivaroxaban (XARELTO) 20 MG TABS tablet Take 1 tablet (20 mg total) by mouth daily with supper. 06/24/20   Tresa Garter, MD    Allergies    Buprenorphine hcl, Levaquin [levofloxacin], Meperidine, Morphine, Morphine and related, Toradol [ketorolac tromethamine], Tramadol, Vancomycin, and Zosyn [piperacillin sod-tazobactam so]  Review of Systems   Review of Systems  Constitutional:        Per HPI, otherwise negative  HENT:         Per HPI, otherwise negative  Respiratory:         Per HPI, otherwise negative  Cardiovascular:        Per HPI, otherwise negative  Gastrointestinal:  Negative for vomiting.  Endocrine:       Negative aside from HPI  Genitourinary:        Neg aside from HPI   Musculoskeletal:        Per HPI, otherwise negative  Skin: Negative.   Allergic/Immunologic: Positive  for immunocompromised state.  Neurological:  Negative for syncope.   Physical Exam Updated Vital Signs BP 102/66 (BP Location: Right Arm)   Pulse 91   Temp 99.7 F (37.6 C) (Oral)   Resp 16   Ht 6\' 2"  (1.88 m)   Wt 61.2 kg   SpO2 100%   BMI 17.33 kg/m   Physical Exam Vitals and nursing note reviewed.  Constitutional:      General: He is not in acute distress.    Appearance: He is well-developed.  HENT:     Head: Normocephalic and atraumatic.  Eyes:     Conjunctiva/sclera: Conjunctivae normal.  Cardiovascular:     Rate and Rhythm: Normal rate and regular rhythm.  Pulmonary:     Effort: Pulmonary effort is normal. No respiratory distress.     Breath sounds: No stridor.  Abdominal:     General: There is no distension.  Skin:    General: Skin is warm and dry.  Neurological:     Mental Status: He is alert and oriented to person, place, and time.     Cranial Nerves: No cranial nerve deficit.     Motor: No weakness.     Coordination: Coordination normal.    ED Results / Procedures / Treatments   Labs (all labs ordered are listed, but only abnormal results are displayed) Labs Reviewed  COMPREHENSIVE METABOLIC PANEL  CBC WITH DIFFERENTIAL/PLATELET  RETICULOCYTES  URINALYSIS, ROUTINE W REFLEX MICROSCOPIC    EKG None  Radiology DG Chest 2 View  Result Date: 11/24/2020 CLINICAL DATA:  Sickle cell pain in his lower back x2 days. EXAM: CHEST - 2 VIEW COMPARISON:  September 11, 2020 FINDINGS: The heart size and mediastinal contours are within normal limits. Both lungs are clear. Dense osseous structures are seen consistent with the patient's history of sickle cell anemia. IMPRESSION: No active cardiopulmonary disease. Electronically Signed   By: Virgina Norfolk M.D.   On: 11/24/2020 18:44    Procedures Procedures   Medications Ordered in ED Medications  0.45 % sodium chloride infusion (has no administration in time range)  HYDROmorphone (DILAUDID) injection 2 mg (has  no administration in time range)  HYDROmorphone (DILAUDID) injection 2 mg (has no administration in time range)  oxyCODONE (Oxy IR/ROXICODONE) immediate release tablet 15 mg (has no administration in time range)  diphenhydrAMINE (BENADRYL) injection 25 mg (has no administration in time range)    ED Course  I have reviewed the triage vital signs and the nursing notes.  Pertinent labs & imaging results that were available during my care of the patient were reviewed by me and considered in my medical decision making (see chart for details). 12:01 AM Patient calm, hemodynamically unremarkable.  He does have ongoing pain.  Labs reviewed, discussed, x-ray reviewed.  No active cardiopulmonary disease.  He has mild leukocytosis, but is afebrile, no chest pain or dyspnea suggest pneumonia or acute chest.   MDM Rules/Calculators/A&P Adult male with sickle cell disease presents with exacerbation of his typical distribution.  Patient is neurovascularly intact, hemodynamically unremarkable, but requires ongoing analgesia, repeat evaluation.  Dr. Ralene Bathe is aware at signout. Final Clinical Impression(s) / ED Diagnoses Final diagnoses:  Sickle cell pain crisis [facility])     Carmin Muskrat, MD 11/25/20 0002

## 2020-11-24 NOTE — ED Provider Notes (Signed)
Emergency Medicine Provider Triage Evaluation Note  Rizwan Kuyper , a 30 y.o. male  was evaluated in triage.  Pt complains of 2 days of low back pain and some hip pain.  He states that this is consistent with his usual sickle cell pain.  He does not know what has triggered the symptoms.  He states that he feels that he is hydrated he does state that he has had flares in the past with seasonal changes  Denies any fevers, urinary symptoms, chest pain, shortness of breath, cough lightheadedness or dizziness.  No abdominal pain nausea or vomiting.  Review of Systems  Positive: Pain Negative: Fever  Physical Exam  BP 102/66 (BP Location: Right Arm)   Pulse 91   Temp 99.7 F (37.6 C) (Oral)   Resp 16   Ht 6\' 2"  (1.88 m)   Wt 61.2 kg   SpO2 100%   BMI 17.33 kg/m  Gen:   Awake, no distress, somewhat uncomfortable appearing Resp:  Normal effort  MSK:   Moves extremities without difficulty  Other:    Medical Decision Making  Medically screening exam initiated at 6:00 PM.  Appropriate orders placed.  Lamark Schue was informed that the remainder of the evaluation will be completed by another provider, this initial triage assessment does not replace that evaluation, and the importance of remaining in the ED until their evaluation is complete.  Sickle cell patient with pain today.  Will obtain sickle cell labs as well as urine and chest x-ray.   Pati Gallo [place], [place] 11/24/20 1801    Carmin Muskrat, MD 11/24/20 1939

## 2020-11-25 MED ORDER — KETAMINE HCL 50 MG/5ML IJ SOSY
0.3000 mg/kg | PREFILLED_SYRINGE | Freq: Once | INTRAMUSCULAR | Status: AC
  Administered 2020-11-25: 18 mg via INTRAVENOUS
  Filled 2020-11-25: qty 5

## 2020-11-25 MED ORDER — HYDROMORPHONE HCL 2 MG/ML IJ SOLN
2.0000 mg | Freq: Once | INTRAMUSCULAR | Status: AC
Start: 2020-11-25 — End: 2020-11-25
  Administered 2020-11-25: 2 mg via INTRAVENOUS
  Filled 2020-11-25: qty 1

## 2020-11-25 NOTE — Discharge Instructions (Addendum)
As discussed, your evaluation today has been largely reassuring.  But, it is important that you monitor your condition carefully, and do not hesitate to return to the ED if you develop new, or concerning changes in your condition. ? ?Otherwise, please follow-up with your physician for appropriate ongoing care. ? ?

## 2020-11-26 ENCOUNTER — Encounter (HOSPITAL_COMMUNITY): Payer: Self-pay | Admitting: Emergency Medicine

## 2020-11-26 ENCOUNTER — Other Ambulatory Visit: Payer: Self-pay

## 2020-11-26 ENCOUNTER — Inpatient Hospital Stay (HOSPITAL_COMMUNITY)
Admission: EM | Admit: 2020-11-26 | Discharge: 2020-12-02 | DRG: 812 | Disposition: A | Discharge status: Home / Self Care | Payer: Medicare Other | Attending: Internal Medicine | Admitting: Internal Medicine

## 2020-11-26 DIAGNOSIS — D72829 Elevated white blood cell count, unspecified: Secondary | ICD-10-CM | POA: Diagnosis present

## 2020-11-26 DIAGNOSIS — Z79899 Other long term (current) drug therapy: Secondary | ICD-10-CM

## 2020-11-26 DIAGNOSIS — F32A Depression, unspecified: Secondary | ICD-10-CM | POA: Diagnosis present

## 2020-11-26 DIAGNOSIS — Z86718 Personal history of other venous thrombosis and embolism: Secondary | ICD-10-CM | POA: Diagnosis not present

## 2020-11-26 DIAGNOSIS — Z9114 Patient's other noncompliance with medication regimen: Secondary | ICD-10-CM

## 2020-11-26 DIAGNOSIS — Z885 Allergy status to narcotic agent status: Secondary | ICD-10-CM

## 2020-11-26 DIAGNOSIS — D5700 Hb-SS disease with crisis, unspecified: Principal | ICD-10-CM | POA: Diagnosis present

## 2020-11-26 DIAGNOSIS — E559 Vitamin D deficiency, unspecified: Secondary | ICD-10-CM | POA: Diagnosis present

## 2020-11-26 DIAGNOSIS — D638 Anemia in other chronic diseases classified elsewhere: Secondary | ICD-10-CM | POA: Diagnosis present

## 2020-11-26 DIAGNOSIS — R4689 Other symptoms and signs involving appearance and behavior: Secondary | ICD-10-CM | POA: Diagnosis present

## 2020-11-26 DIAGNOSIS — Z881 Allergy status to other antibiotic agents status: Secondary | ICD-10-CM | POA: Diagnosis not present

## 2020-11-26 DIAGNOSIS — Z86711 Personal history of pulmonary embolism: Secondary | ICD-10-CM

## 2020-11-26 DIAGNOSIS — Z888 Allergy status to other drugs, medicaments and biological substances status: Secondary | ICD-10-CM

## 2020-11-26 DIAGNOSIS — F1120 Opioid dependence, uncomplicated: Secondary | ICD-10-CM | POA: Diagnosis present

## 2020-11-26 DIAGNOSIS — Z5320 Procedure and treatment not carried out because of patient's decision for unspecified reasons: Secondary | ICD-10-CM | POA: Diagnosis not present

## 2020-11-26 DIAGNOSIS — Z832 Family history of diseases of the blood and blood-forming organs and certain disorders involving the immune mechanism: Secondary | ICD-10-CM | POA: Diagnosis not present

## 2020-11-26 DIAGNOSIS — Z87891 Personal history of nicotine dependence: Secondary | ICD-10-CM

## 2020-11-26 DIAGNOSIS — Z7901 Long term (current) use of anticoagulants: Secondary | ICD-10-CM

## 2020-11-26 DIAGNOSIS — I82409 Acute embolism and thrombosis of unspecified deep veins of unspecified lower extremity: Secondary | ICD-10-CM | POA: Diagnosis present

## 2020-11-26 DIAGNOSIS — G894 Chronic pain syndrome: Secondary | ICD-10-CM | POA: Diagnosis present

## 2020-11-26 DIAGNOSIS — Z20822 Contact with and (suspected) exposure to covid-19: Secondary | ICD-10-CM | POA: Diagnosis present

## 2020-11-26 LAB — CBC WITH DIFFERENTIAL/PLATELET
Abs Immature Granulocytes: 0.03 10*3/uL (ref 0.00–0.07)
Basophils Absolute: 0 10*3/uL (ref 0.0–0.1)
Basophils Relative: 0 %
Eosinophils Absolute: 0.2 10*3/uL (ref 0.0–0.5)
Eosinophils Relative: 3 %
HCT: 29.6 % — ABNORMAL LOW (ref 39.0–52.0)
Hemoglobin: 10.5 g/dL — ABNORMAL LOW (ref 13.0–17.0)
Immature Granulocytes: 0 %
Lymphocytes Relative: 24 %
Lymphs Abs: 2.1 10*3/uL (ref 0.7–4.0)
MCH: 30.9 pg (ref 26.0–34.0)
MCHC: 35.5 g/dL (ref 30.0–36.0)
MCV: 87.1 fL (ref 80.0–100.0)
Monocytes Absolute: 0.6 10*3/uL (ref 0.1–1.0)
Monocytes Relative: 7 %
Neutro Abs: 5.7 10*3/uL (ref 1.7–7.7)
Neutrophils Relative %: 66 %
Platelets: 486 10*3/uL — ABNORMAL HIGH (ref 150–400)
RBC: 3.4 MIL/uL — ABNORMAL LOW (ref 4.22–5.81)
RDW: 16.9 % — ABNORMAL HIGH (ref 11.5–15.5)
WBC: 8.7 10*3/uL (ref 4.0–10.5)
nRBC: 1.1 % — ABNORMAL HIGH (ref 0.0–0.2)

## 2020-11-26 LAB — COMPREHENSIVE METABOLIC PANEL
ALT: 11 U/L (ref 0–44)
AST: 23 U/L (ref 15–41)
Albumin: 4.8 g/dL (ref 3.5–5.0)
Alkaline Phosphatase: 76 U/L (ref 38–126)
Anion gap: 10 (ref 5–15)
BUN: 5 mg/dL — ABNORMAL LOW (ref 6–20)
CO2: 24 mmol/L (ref 22–32)
Calcium: 9.7 mg/dL (ref 8.9–10.3)
Chloride: 104 mmol/L (ref 98–111)
Creatinine, Ser: 0.53 mg/dL — ABNORMAL LOW (ref 0.61–1.24)
GFR, Estimated: >60 mL/min (ref >60)
Glucose, Bld: 87 mg/dL (ref 70–99)
Potassium: 4 mmol/L (ref 3.5–5.1)
Sodium: 138 mmol/L (ref 135–145)
Total Bilirubin: 1.9 mg/dL — ABNORMAL HIGH (ref 0.3–1.2)
Total Protein: 8.2 g/dL — ABNORMAL HIGH (ref 6.5–8.1)

## 2020-11-26 LAB — RETICULOCYTES
Immature Retic Fract: 37.4 % — ABNORMAL HIGH (ref 2.3–15.9)
RBC.: 3.38 MIL/uL — ABNORMAL LOW (ref 4.22–5.81)
Retic Count, Absolute: 297.8 10*3/uL — ABNORMAL HIGH (ref 19.0–186.0)
Retic Ct Pct: 8.8 % — ABNORMAL HIGH (ref 0.4–3.1)

## 2020-11-26 LAB — RESP PANEL BY RT-PCR (FLU A&B, COVID) ARPGX2
Influenza A by PCR: NEGATIVE
Influenza B by PCR: NEGATIVE
SARS Coronavirus 2 by RT PCR: NEGATIVE

## 2020-11-26 MED ORDER — HYDROXYZINE HCL 10 MG PO TABS
10.0000 mg | ORAL_TABLET | Freq: Three times a day (TID) | ORAL | Status: DC | PRN
  Filled 2020-11-26: qty 1

## 2020-11-26 MED ORDER — HYDROMORPHONE HCL 2 MG/ML IJ SOLN
2.0000 mg | INTRAMUSCULAR | Status: AC
  Administered 2020-11-26: 2 mg via INTRAVENOUS
  Filled 2020-11-26 (×2): qty 1

## 2020-11-26 MED ORDER — SENNOSIDES-DOCUSATE SODIUM 8.6-50 MG PO TABS
1.0000 | ORAL_TABLET | Freq: Every day | ORAL | Status: DC
  Administered 2020-11-26 – 2020-11-29 (×4): 1 via ORAL
  Filled 2020-11-26 (×6): qty 1

## 2020-11-26 MED ORDER — KETOROLAC TROMETHAMINE 15 MG/ML IJ SOLN
15.0000 mg | Freq: Four times a day (QID) | INTRAMUSCULAR | Status: AC
  Administered 2020-11-26 – 2020-12-01 (×19): 15 mg via INTRAVENOUS
  Filled 2020-11-26 (×19): qty 1

## 2020-11-26 MED ORDER — MELATONIN 3 MG PO TABS
3.0000 mg | ORAL_TABLET | Freq: Once | ORAL | Status: AC
  Administered 2020-11-27: 3 mg via ORAL
  Filled 2020-11-26: qty 1

## 2020-11-26 MED ORDER — SODIUM CHLORIDE 0.9% FLUSH
9.0000 mL | INTRAVENOUS | Status: DC | PRN
Start: 2020-11-26 — End: 2020-12-02

## 2020-11-26 MED ORDER — HYDROMORPHONE 1 MG/ML IV SOLN
INTRAVENOUS | Status: DC
  Administered 2020-11-26: 30 mg via INTRAVENOUS
  Administered 2020-11-26: 8.5 mg via INTRAVENOUS
  Administered 2020-11-27: 30 mg via INTRAVENOUS
  Administered 2020-11-27: 0.5 mg via INTRAVENOUS
  Administered 2020-11-27 (×2): 4 mg via INTRAVENOUS
  Administered 2020-11-27: 5.5 mg via INTRAVENOUS
  Administered 2020-11-28: 30 mg via INTRAVENOUS
  Administered 2020-11-28 (×2): 5 mg via INTRAVENOUS
  Administered 2020-11-29: 7 mg via INTRAVENOUS
  Administered 2020-11-29: 5 mg via INTRAVENOUS
  Filled 2020-11-26 (×4): qty 30

## 2020-11-26 MED ORDER — DIPHENHYDRAMINE HCL 50 MG/ML IJ SOLN
25.0000 mg | Freq: Once | INTRAMUSCULAR | Status: AC
  Administered 2020-11-26: 25 mg via INTRAVENOUS
  Filled 2020-11-26: qty 1

## 2020-11-26 MED ORDER — VITAMIN D3 25 MCG (1000 UNIT) PO TABS
2000.0000 [IU] | ORAL_TABLET | Freq: Every day | ORAL | Status: DC
  Administered 2020-11-26 – 2020-12-02 (×7): 2000 [IU] via ORAL
  Filled 2020-11-26 (×8): qty 2

## 2020-11-26 MED ORDER — ONDANSETRON HCL 4 MG/2ML IJ SOLN
4.0000 mg | INTRAMUSCULAR | Status: DC | PRN
  Administered 2020-11-26: 4 mg via INTRAVENOUS
  Filled 2020-11-26: qty 2

## 2020-11-26 MED ORDER — RIVAROXABAN 20 MG PO TABS
20.0000 mg | ORAL_TABLET | Freq: Every day | ORAL | Status: DC
  Administered 2020-11-26 – 2020-12-01 (×6): 20 mg via ORAL
  Filled 2020-11-26 (×7): qty 1

## 2020-11-26 MED ORDER — FOLIC ACID 1 MG PO TABS
1.0000 mg | ORAL_TABLET | Freq: Every day | ORAL | Status: DC
  Administered 2020-11-26 – 2020-12-02 (×7): 1 mg via ORAL
  Filled 2020-11-26 (×7): qty 1

## 2020-11-26 MED ORDER — GABAPENTIN 300 MG PO CAPS
300.0000 mg | ORAL_CAPSULE | Freq: Three times a day (TID) | ORAL | Status: DC
  Administered 2020-11-26 – 2020-12-02 (×18): 300 mg via ORAL
  Filled 2020-11-26 (×18): qty 1

## 2020-11-26 MED ORDER — SODIUM CHLORIDE 0.45 % IV SOLN
INTRAVENOUS | Status: DC
Start: 2020-11-26 — End: 2020-12-02

## 2020-11-26 MED ORDER — DIPHENHYDRAMINE HCL 25 MG PO CAPS
25.0000 mg | ORAL_CAPSULE | ORAL | Status: DC | PRN
  Administered 2020-11-26: 25 mg via ORAL
  Filled 2020-11-26: qty 1

## 2020-11-26 MED ORDER — OXYCODONE HCL ER 20 MG PO T12A
20.0000 mg | EXTENDED_RELEASE_TABLET | Freq: Two times a day (BID) | ORAL | Status: DC
  Administered 2020-11-26 – 2020-12-02 (×13): 20 mg via ORAL
  Filled 2020-11-26 (×13): qty 1

## 2020-11-26 MED ORDER — HYDROXYUREA 500 MG PO CAPS
500.0000 mg | ORAL_CAPSULE | Freq: Two times a day (BID) | ORAL | Status: DC
Start: 2020-11-26 — End: 2020-11-30
  Administered 2020-11-26 – 2020-11-29 (×8): 500 mg via ORAL
  Filled 2020-11-26 (×9): qty 1

## 2020-11-26 MED ORDER — POLYETHYLENE GLYCOL 3350 17 G PO PACK: 17.0000 g | PACK | Freq: Every day | ORAL | Status: DC | PRN

## 2020-11-26 MED ORDER — HYDROMORPHONE HCL 2 MG/ML IJ SOLN
2.0000 mg | Freq: Once | INTRAMUSCULAR | Status: AC
  Administered 2020-11-26: 2 mg via INTRAVENOUS
  Filled 2020-11-26: qty 1

## 2020-11-26 MED ORDER — NALOXONE HCL 0.4 MG/ML IJ SOLN
0.4000 mg | INTRAMUSCULAR | Status: DC | PRN
Start: 2020-11-26 — End: 2020-12-02

## 2020-11-26 MED ORDER — ONDANSETRON HCL 4 MG/2ML IJ SOLN
4.0000 mg | Freq: Four times a day (QID) | INTRAMUSCULAR | Status: DC | PRN
  Administered 2020-11-26 – 2020-11-27 (×3): 4 mg via INTRAVENOUS
  Filled 2020-11-26 (×3): qty 2

## 2020-11-26 MED ORDER — HYDROMORPHONE HCL 1 MG/ML IJ SOLN
1.0000 mg | INTRAMUSCULAR | Status: DC | PRN
  Administered 2020-11-26 – 2020-11-27 (×4): 1 mg via INTRAVENOUS
  Filled 2020-11-26 (×4): qty 1

## 2020-11-26 MED ORDER — HYDROMORPHONE HCL 2 MG/ML IJ SOLN
2.0000 mg | INTRAMUSCULAR | Status: AC
  Administered 2020-11-26: 2 mg via INTRAVENOUS

## 2020-11-26 NOTE — ED Notes (Signed)
Pt is aware we are waiting on IV team for access to give medications through.  Emotional support provided.

## 2020-11-26 NOTE — ED Notes (Signed)
IV team at bedside to obtain IV access.

## 2020-11-26 NOTE — ED Triage Notes (Signed)
Patient reports SCP to lower back and bilateral legs x2 days.

## 2020-11-26 NOTE — H&P (Signed)
H&P  Patient Demographics:  Timothy Marks, is a 30 y.o. male  MRN: [id number]   DOB - [birth year]  Admit Date - 11/26/2020  Outpatient Primary MD for the patient is Tresa Garter, MD  Chief Complaint  Patient presents with   Sickle Cell Pain Crisis      HPI:   Timothy Marks  is a 30 y.o. male with a medical history significant for sickle cell disease, chronic pain syndrome, opiate dependence and tolerance, and history of anemia of chronic disease that presented to the emergency department with complaints of low back pain over the past 3 days.  Patient was treated and evaluated in the emergency department on yesterday for this problem without sustained relief.  He says that he has been taking his all medications consistently.  Home medications consist of Percocet and OxyContin.  Patient attributes current pain crisis to changes in weather.  He currently rates his pain as 9/10 characterized as intermittent and throbbing.  He denies any fever, chills, headache, chest pain, or shortness of breath.  No urinary symptoms, nausea, vomiting, or diarrhea.  Patient has had no recent falls or injuries.  He has had no sick contacts, recent travel, or known exposure to COVID-19.  Emergency department course: Vital signs show temperature 98.3 F, blood pressure 124/73, pulse rate 68, respirations 14, and oxygen saturation 98% on RA.  Complete blood count shows WBCs 8.7, hemoglobin 10.5 g/dL, and platelets 486,000.  Complete metabolic panel shows total bilirubin 1.9, otherwise unremarkable.  Patient's pain persists despite IV Dilaudid, IV fluids.  Notified to admit for further management of sickle cell pain crisis.   Review of systems:  In addition to the HPI above, patient reports No fever or chills No Headache, No changes with vision or hearing No problems swallowing food or liquids No chest pain, cough or shortness of breath No abdominal pain, No nausea or vomiting, Bowel movements are regular No  blood in stool or urine No dysuria No new skin rashes or bruises No new joints pains-aches No new weakness, tingling, numbness in any extremity No recent weight gain or loss No polyuria, polydypsia or polyphagia No significant Mental Stressors    With Past History of the following :   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 Authorizing Provider  cholecalciferol (VITAMIN D) 1000 units tablet Take 2 tablets (2,000 Units total) by mouth daily. 12/13/17   Azzie Glatter, FNP  folic acid (FOLVITE) 1 MG tablet Take 1 tablet (1 mg total) by mouth daily. 05/07/20   Tresa Garter, MD  gabapentin (NEURONTIN) 300 MG capsule Take 1 capsule (300  mg total) by mouth 3 (three) times daily. 05/07/20   Tresa Garter, MD  hydroxyurea (HYDREA) 500 MG capsule Take 1 capsule (500 mg total) by mouth 2 (two) times daily. May take with food to minimize GI side effects. 05/07/20   Tresa Garter, MD  hydrOXYzine (ATARAX/VISTARIL) 10 MG tablet Take 1 tablet (10 mg total) by mouth 3 (three) times daily as needed for itching. 01/16/20   Tresa Garter, MD  Nutritional Supplements (ENSURE ACTIVE HIGH PROTEIN) LIQD Take 1 each by mouth 3 (three) times daily between meals. #90 cans of  Ensure Patient taking differently: Take 1 each by mouth 3 (three) times daily. 11/13/18   Dorena Dew, FNP  oxyCODONE-acetaminophen (PERCOCET) 10-325 MG tablet Take 1 tablet by mouth every 6 (six) hours as needed for up to 15 days for pain. 11/16/20 12/01/20  Tresa Garter, MD  OXYCONTIN 20 MG 12 hr tablet Take 1 tablet (20 mg total) by mouth every 12 (twelve) hours. 11/16/20 12/16/20  Tresa Garter, MD  rivaroxaban (XARELTO) 20 MG TABS tablet Take 1 tablet (20 mg total) by mouth daily with supper. 06/24/20   Tresa Garter, MD     Allergies:   Allergies  Allergen Reactions   Buprenorphine Hcl Hives   Levaquin [Levofloxacin] Itching   Meperidine Rash   Morphine Hives   Morphine And Related Hives   Toradol [Ketorolac Tromethamine] Itching   Tramadol Hives   Vancomycin Itching   Zosyn [Piperacillin Sod-Tazobactam So] Itching, Rash and Other (See Comments)    Has taken rocephin in the past     Physical Exam:   Vitals:   Vitals:   11/26/20 1200 11/26/20 1215  BP: 112/77   Pulse: 73 70  Resp: 14 13  Temp:    SpO2: 100% 100%    Physical Exam: Constitutional: Patient appears well-developed and well-nourished. Not in obvious distress. HENT: Normocephalic, atraumatic, External right and left ear normal. Oropharynx is clear and moist.  Eyes: Conjunctivae and EOM are normal. PERRLA, no scleral icterus. Neck: Normal ROM. Neck supple. No JVD. No tracheal deviation. No thyromegaly. CVS: RRR, S1/S2 +, no murmurs, no gallops, no carotid bruit.  Pulmonary: Effort and breath sounds normal, no stridor, rhonchi, wheezes, rales.  Abdominal: Soft. BS +, no distension, tenderness, rebound or guarding.  Musculoskeletal: Normal range of motion. No edema and no tenderness.  Lymphadenopathy: No lymphadenopathy noted, cervical, inguinal or axillary Neuro: Alert. Normal reflexes, muscle tone coordination. No cranial nerve deficit. Skin: Skin is warm and dry. No rash noted. Not  diaphoretic. No erythema. No pallor. Psychiatric: Normal mood and affect. Behavior, judgment, thought content normal.   Data Review:   CBC Recent Labs  Lab 11/24/20 2010 11/26/20 1023  WBC 11.5* 8.7  HGB 10.1* 10.5*  HCT 28.5* 29.6*  PLT 484* 486*  MCV 86.6 87.1  MCH 30.7 30.9  MCHC 35.4 35.5  RDW 16.7* 16.9*  LYMPHSABS 3.1 2.1  MONOABS 0.9 0.6  EOSABS 0.2 0.2  BASOSABS 0.0 0.0   ------------------------------------------------------------------------------------------------------------------  Chemistries  Recent Labs  Lab 11/24/20 2010  NA 137  K 4.0  CL 104  CO2 26  GLUCOSE 91  BUN 8  CREATININE 0.70  CALCIUM 9.3  AST 26  ALT 12  ALKPHOS 78  BILITOT 2.8*   ------------------------------------------------------------------------------------------------------------------ estimated creatinine clearance is 114.8 mL/min (by C-G formula based on SCr of 0.7 mg/dL). ------------------------------------------------------------------------------------------------------------------ No results for input(s): TSH, T4TOTAL, T3FREE, THYROIDAB in the last 72 hours.  Invalid input(s):  FREET3  Coagulation profile No results for input(s): INR, PROTIME in the last 168 hours. ------------------------------------------------------------------------------------------------------------------- No results for input(s): DDIMER in the last 72 hours. -------------------------------------------------------------------------------------------------------------------  Cardiac Enzymes No results for input(s): CKMB, TROPONINI, MYOGLOBIN in the last 168 hours.  Invalid input(s): CK ------------------------------------------------------------------------------------------------------------------ No results found for: BNP  ---------------------------------------------------------------------------------------------------------------  Urinalysis    Component Value Date/Time   COLORURINE  YELLOW 11/24/2020 2236   APPEARANCEUR CLEAR 11/24/2020 2236   LABSPEC 1.006 11/24/2020 2236   PHURINE 7.0 11/24/2020 2236   GLUCOSEU NEGATIVE 11/24/2020 2236   HGBUR MODERATE (A) 11/24/2020 2236   BILIRUBINUR NEGATIVE 11/24/2020 2236   BILIRUBINUR small 05/12/2020 1227   KETONESUR NEGATIVE 11/24/2020 2236   PROTEINUR NEGATIVE 11/24/2020 2236   UROBILINOGEN 4.0 (A) 05/12/2020 1227   UROBILINOGEN 2.0 (H) 05/01/2017 0955   NITRITE NEGATIVE 11/24/2020 2236   LEUKOCYTESUR TRACE (A) 11/24/2020 2236    ----------------------------------------------------------------------------------------------------------------   Imaging Results:    DG Chest 2 View  Result Date: 11/24/2020 CLINICAL DATA:  Sickle cell pain in his lower back x2 days. EXAM: CHEST - 2 VIEW COMPARISON:  September 11, 2020 FINDINGS: The heart size and mediastinal contours are within normal limits. Both lungs are clear. Dense osseous structures are seen consistent with the patient's history of sickle cell anemia. IMPRESSION: No active cardiopulmonary disease. Electronically Signed   By: Virgina Norfolk M.D.   On: 11/24/2020 18:44     Assessment & Plan:  Principal Problem:   Sickle cell pain crisis (Post) Active Problems:   Chronic pain syndrome   DVT (deep venous thrombosis) (HCC)   History of DVT (deep vein thrombosis)  Sickle cell disease with pain crisis: Admit to MedSurg. Continue IV fluids, 0.45% saline at 100 mL/h IV Dilaudid PCA with settings of 0.5 mg, 10-minute lockout, and 3 mg/h Toradol 15 mg IV every 6 hours OxyContin 20 mg every 12 hours Monitor vital signs very closely, reevaluate pain scale regularly, and supplemental oxygen as needed  Anemia of chronic disease: Hemoglobin is 10.5, consistent with patient's baseline.  There is no clinical indication for blood transfusion at this time.  Continue to follow closely.  Chronic pain syndrome: Continue home medications  History of PE: Continue Xarelto  DVT  Prophylaxis: Subcut Lovenox and SCDs  AM Labs Ordered, also please review Full Orders  Family Communication: Admission, patient's condition and plan of care including tests being ordered have been discussed with the patient who indicate understanding and agree with the plan and Code Status.  Code Status: Full Code  Consults called: None    Admission status: Inpatient    Time spent in minutes : 30 minutes   Miller, MSN, FNP-C [facility] [address] [place], [place] [address] [phone]  11/26/2020 at 12:48 PM

## 2020-11-26 NOTE — ED Provider Notes (Signed)
[facility] DEPT Provider Note   CSN: [id number] Arrival date & time: 11/26/20  0746     History Chief Complaint  Patient presents with   Sickle Cell Pain Crisis    Timothy Marks is a 30 y.o. male.  Timothy Marks is a 30 y.o. male with a history of sickle cell anemia, who presents to the emergency department for evaluation of pain in his low back and legs.  Symptoms started 2 days ago and he was initially seen in the ED and treated with pain medication for sickle cell pain crisis.  Pain initially improved and he was discharged home and tried to continue to manage pain on his own at home but reports worsening pain today on managed by his home pain medications.  Reports the pain in his low back and legs is typical of his sickle cell pain crisis.  He denies any associated chest pain, shortness of breath, fever or cough.  No nausea or vomiting.  No extremity swelling, numbness or weakness.  No other aggravating or alleviating factors.  The history is provided by the patient and medical records.      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   Sickle cell anemia (HCC)    Vitamin D deficiency y-6    Patient Active Problem List   Diagnosis Date Noted   Lab test positive for detection of COVID-19 virus 01/23/2020   Medication management 01/16/2020   Underweight 06/14/2019   Generalized weakness 03/31/2019   Sickle-cell crisis ([place]) 03/24/2019   SIRS (systemic inflammatory response syndrome) ([place]) 02/10/2019   Abnormal liver function 11/04/2018   Sickle cell anemia with crisis ([place]) 05/22/2018   Narcotic dependence ([place]) 02/10/2018   Acute bilateral low back pain without sciatica    Other chronic pain    Anemia    Socially inappropriate behavior 12/02/2017   Thrombocythemia    Physical deconditioning     Agitation    Chronic pain syndrome    Recurrent cold sores    Fever of unknown origin    Drug-seeking behavior 08/30/2017   Pain    Sickle cell crisis ([place]) 07/16/2017   Adjustment disorder with mixed disturbance of emotions and conduct    Bilateral pulmonary infiltrates on chest x-ray 11/21/2016   History of DVT (deep vein thrombosis) 11/17/2016   Suicidal ideations 10/11/2016   Reticulocytosis 10/09/2016   DVT (deep venous thrombosis) ([place]) 08/02/2016   Has difficulty accessing primary care provider for most visits 07/11/2015   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 ([place]) 06/29/2015   Hordeolum externum of left upper eyelid 05/31/2015   Major depressive disorder with current active episode 11/09/2014   Hb-SS disease with acute chest syndrome ([place]) 11/08/2014   Pneumonia of right upper lobe due to infectious organism 11/07/2014   Constipation 08/03/2014   Leukocytosis 06/30/2014   h/o Priapism 05/06/2014   Protein-calorie malnutrition, severe ([place]) 04/09/2014   Major depressive disorder, recurrent, severe without psychotic features ([place])    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, left shoulder ([place])    Vitamin D deficiency    Sickle cell anemia ([place]) 03/30/2014   Hyperbilirubinemia 03/30/2014   Hypokalemia 02/07/2014   Mandible fracture ([place]) 05/03/2013   Aggressive behavior 01/25/2013   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   Sickle cell anemia with pain ([place]) 01/24/2013   Sickle cell pain crisis (  [place]) 01/24/2013   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 Authorizing Provider  folic acid (FOLVITE) 1 MG tablet Take 1 tablet (1 mg total) by mouth daily. 05/07/20  Yes Tresa Garter, MD  gabapentin (NEURONTIN) 300 MG capsule Take 1 capsule (300 mg total) by mouth 3 (three) times daily. 05/07/20  Yes Tresa Garter, MD  hydroxyurea (HYDREA) 500 MG capsule Take 1 capsule (500 mg total) by mouth 2 (two) times daily. May take with food to minimize GI side effects. 05/07/20  Yes Tresa Garter, MD  oxyCODONE-acetaminophen (PERCOCET) 10-325 MG tablet Take 1 tablet by mouth every 6 (six) hours as needed for up to 15 days for pain. 11/16/20 12/01/20 Yes Jegede, Olugbemiga E, MD  OXYCONTIN 20 MG 12 hr tablet Take 1 tablet (20 mg total) by mouth every 12 (twelve) hours. 11/16/20 12/16/20 Yes Tresa Garter, MD  rivaroxaban (XARELTO) 20 MG TABS tablet Take 1 tablet (20 mg total) by mouth daily with supper. 06/24/20  Yes Tresa Garter, MD  cholecalciferol (VITAMIN D) 1000 units tablet Take 2 tablets (2,000 Units total) by mouth daily. Patient not taking: Reported on 11/26/2020 12/13/17   Azzie Glatter, FNP  hydrOXYzine (ATARAX/VISTARIL) 10 MG tablet Take 1 tablet (10 mg total) by mouth 3 (three) times daily as needed for itching. Patient not taking: Reported on 11/26/2020 01/16/20   Tresa Garter, MD  Nutritional Supplements (ENSURE ACTIVE HIGH PROTEIN) LIQD Take 1 each by mouth 3 (three) times daily between meals. #90 cans of Ensure Patient not taking: No sig reported 11/13/18   Dorena Dew, FNP    Allergies    Buprenorphine hcl, Levaquin [levofloxacin], Meperidine, Morphine, Morphine and related, Toradol [ketorolac tromethamine], Tramadol, Vancomycin, and Zosyn [piperacillin  sod-tazobactam so]  Review of Systems   Review of Systems  Constitutional:  Negative for chills and fever.  HENT: Negative.    Respiratory:  Negative for cough and shortness of breath.   Cardiovascular:  Negative for chest pain and leg swelling.  Gastrointestinal:  Negative for abdominal pain, nausea and vomiting.  Genitourinary:  Negative for dysuria.  Musculoskeletal:  Positive for arthralgias, back pain and myalgias. Negative for joint swelling.  Skin:  Negative for color change.  Neurological:  Negative for weakness and numbness.  All other systems reviewed and are negative.  Physical Exam Updated Vital Signs BP 123/73 (BP Location: Right Arm)   Pulse 88   Temp 97.7 F (36.5 C) (Oral)   Resp (!) 25   SpO2 100%   Physical Exam Vitals and nursing note reviewed.  Constitutional:      General: He is not in acute distress.    Appearance: Normal appearance. He is well-developed. He is not ill-appearing or diaphoretic.  HENT:     Head: Normocephalic and atraumatic.  Eyes:     General:        Right eye: No discharge.        Left eye: No discharge.  Cardiovascular:     Rate and Rhythm: Normal rate and regular rhythm.     Pulses: Normal pulses.     Heart sounds: Normal heart sounds.  Pulmonary:     Effort: Pulmonary effort is normal. No respiratory distress.     Breath sounds: Normal breath sounds. No wheezing or rales.     Comments: Respirations equal and unlabored, patient able to speak in full sentences, lungs clear to auscultation bilaterally  Abdominal:     General: Bowel sounds are normal. There is no distension.     Palpations: Abdomen is soft. There is no mass.     Tenderness: There is no abdominal tenderness. There is no guarding.     Comments: Abdomen soft, nondistended, nontender to palpation in all quadrants without guarding or peritoneal signs  Musculoskeletal:        General: No deformity.     Cervical back: Neck supple.  Skin:    General: Skin is warm and  dry.     Capillary Refill: Capillary refill takes less than 2 seconds.  Neurological:     Mental Status: He is alert and oriented to person, place, and time.     Coordination: Coordination normal.     Comments: Speech is clear, able to follow commands Moves extremities without ataxia, coordination intact  Psychiatric:        Mood and Affect: Mood normal.        Behavior: Behavior normal.    ED Results / Procedures / Treatments   Labs (all labs ordered are listed, but only abnormal results are displayed) Labs Reviewed  CBC WITH DIFFERENTIAL/PLATELET - Abnormal; Notable for the following components:      Result Value   RBC 3.40 (*)    Hemoglobin 10.5 (*)    HCT 29.6 (*)    RDW 16.9 (*)    Platelets 486 (*)    nRBC 1.1 (*)    All other components within normal limits  RETICULOCYTES - Abnormal; Notable for the following components:   Retic Ct Pct 8.8 (*)    RBC. 3.38 (*)    Retic Count, Absolute 297.8 (*)    Immature Retic Fract 37.4 (*)    All other components within normal limits  RESP PANEL BY RT-PCR (FLU A&B, COVID) [id number]  COMPREHENSIVE METABOLIC PANEL    EKG None  Radiology None  Procedures Procedures   Medications Ordered in ED Medications  ondansetron (ZOFRAN) injection 4 mg (4 mg Intravenous Given 11/26/20 1041)  HYDROmorphone (DILAUDID) injection 2 mg (2 mg Intravenous Given 11/26/20 1111)  HYDROmorphone (DILAUDID) injection 2 mg (2 mg Intravenous Given 11/26/20 1041)  diphenhydrAMINE (BENADRYL) injection 25 mg (25 mg Intravenous Given 11/26/20 1041)  HYDROmorphone (DILAUDID) injection 2 mg (2 mg Intravenous Given 11/26/20 1244)    ED Course  I have reviewed the triage vital signs and the nursing notes.  Pertinent labs & imaging results that were available during my care of the patient were reviewed by me and considered in my medical decision making (see chart for details).    MDM Rules/Calculators/A&P                           30 year old male  presents with persistent and worsening sickle  cell pain crisis, treated in the ED 2 days ago with some improvement but unable to continue to manage symptoms at home.  Continuing to complain of pain in his back and lower extremities which is typical of his pain crisis.  No associated fevers, chest pain, shortness of breath or cough.  No concern for pneumonia or acute chest syndrome.  Patient has not had any associated abdominal pain or vomiting.  Will get basic labs and reticulocyte count and start patient on sickle cell crisis pain protocol.  CBC: No leukocytosis, hemoglobin at baseline Reticulocyte count is appropriate CMP pending COVID screening: Pending  After 2 doses of pain medication as well as fluids patient reports very little improvement in his pain and he feels he will likely need to be admitted I feel this is reasonable given this is his second visit to the ED during this crisis and he is trying to manage things at home without success.  Will consult sickle cell team for admission.  Case discussed with NP [place] Hollis with sickle cell team who will see and admit the patient.  Final Clinical Impression(s) / ED Diagnoses Final diagnoses:  Sickle cell pain crisis [facility])    Rx / DC Orders ED Discharge Orders     None        Jacqlyn Larsen, [place] 11/26/20 1337    Malvin Johns, MD 11/26/20 [id number]

## 2020-11-27 DIAGNOSIS — D5700 Hb-SS disease with crisis, unspecified: Secondary | ICD-10-CM | POA: Diagnosis not present

## 2020-11-27 LAB — CBC
HCT: 26.9 % — ABNORMAL LOW (ref 39.0–52.0)
Hemoglobin: 9.5 g/dL — ABNORMAL LOW (ref 13.0–17.0)
MCH: 30.5 pg (ref 26.0–34.0)
MCHC: 35.3 g/dL (ref 30.0–36.0)
MCV: 86.5 fL (ref 80.0–100.0)
Platelets: 416 10*3/uL — ABNORMAL HIGH (ref 150–400)
RBC: 3.11 MIL/uL — ABNORMAL LOW (ref 4.22–5.81)
RDW: 19 % — ABNORMAL HIGH (ref 11.5–15.5)
WBC: 18.2 10*3/uL — ABNORMAL HIGH (ref 4.0–10.5)
nRBC: 0.6 % — ABNORMAL HIGH (ref 0.0–0.2)

## 2020-11-27 LAB — BASIC METABOLIC PANEL
Anion gap: 11 (ref 5–15)
BUN: 10 mg/dL (ref 6–20)
CO2: 27 mmol/L (ref 22–32)
Calcium: 8.8 mg/dL — ABNORMAL LOW (ref 8.9–10.3)
Chloride: 98 mmol/L (ref 98–111)
Creatinine, Ser: 0.65 mg/dL (ref 0.61–1.24)
GFR, Estimated: >60 mL/min (ref >60)
Glucose, Bld: 94 mg/dL (ref 70–99)
Potassium: 3.9 mmol/L (ref 3.5–5.1)
Sodium: 136 mmol/L (ref 135–145)

## 2020-11-27 MED ORDER — LIP MEDEX EX OINT
TOPICAL_OINTMENT | CUTANEOUS | Status: DC | PRN
  Filled 2020-11-27: qty 7

## 2020-11-27 MED ORDER — SODIUM CHLORIDE 0.9 % IV SOLN
12.5000 mg | Freq: Four times a day (QID) | INTRAVENOUS | Status: DC | PRN
  Administered 2020-11-27 – 2020-11-28 (×2): 12.5 mg via INTRAVENOUS
  Filled 2020-11-27: qty 12.5
  Filled 2020-11-27 (×2): qty 0.5

## 2020-11-27 NOTE — Progress Notes (Signed)
Pt constantly screaming with unpleasant mood and behavior. Advised that patients are reporting loud disruptive noise and therefore would need to close his door. Pt. Verbalizes profanity and aggressive behaviors. Will continue to implement safety measures and monitor pt. No signs of distress noted. Timothy Marks

## 2020-11-27 NOTE — Progress Notes (Signed)
Subjective: Timothy Marks is a 30 year old male with a medical history significant for sickle cell disease, chronic pain syndrome, opiate dependence and tolerance, history of DVT on Xarelto, and history of anemia of chronic disease that was admitted for sickle cell pain crisis. Patient is complaining of increasing nausea and vomiting.  He says that he has vomited several times overnight and has been unable to rest.  He also endorses low back and lower extremity pain and rates it as 8/10 characterized as constant and aching. He denies any headache, chest pain, urinary symptoms, diarrhea, or constipation.  Patient endorses poor appetite due to nausea and vomiting  Objective:  Vital signs in last 24 hours:  Vitals:   11/27/20 0000 11/27/20 0305 11/27/20 0340 11/27/20 0834  BP:  119/75    Pulse:  79    Resp: 14 14 16 14   Temp:  97.9 F (36.6 C)    TempSrc:  Oral    SpO2: 92% 94% 94% 96%  Weight:      Height:        Intake/Output from previous day:   Intake/Output Summary (Last 24 hours) at 11/27/2020 0955 Last data filed at 11/26/2020 1734 Gross per 24 hour  Intake --  Output 300 ml  Net -300 ml    Physical Exam: General: Alert, awake, oriented x3, in no acute distress.  HEENT: [place]/AT PEERL, EOMI Neck: Trachea midline,  no masses, no thyromegal,y no JVD, no carotid bruit OROPHARYNX:  Moist, No exudate/ erythema/lesions.  Heart: Regular rate and rhythm, without murmurs, rubs, gallops, PMI non-displaced, no heaves or thrills on palpation.  Lungs: Clear to auscultation, no wheezing or rhonchi noted. No increased vocal fremitus resonant to percussion  Abdomen: Soft, nontender, nondistended, positive bowel sounds, no masses no hepatosplenomegaly noted..  Neuro: No focal neurological deficits noted cranial nerves II through XII grossly intact. DTRs 2+ bilaterally upper and lower extremities. Strength 5 out of 5 in bilateral upper and lower extremities. Musculoskeletal: No warm swelling or  erythema around joints, no spinal tenderness noted. Psychiatric: Patient alert and oriented x3, good insight and cognition, good recent to remote recall. Lymph node survey: No cervical axillary or inguinal lymphadenopathy noted.  Lab Results:  Basic Metabolic Panel:    Component Value Date/Time   NA 136 11/27/2020 0533   NA 140 05/12/2020 1115   K 3.9 11/27/2020 0533   CL 98 11/27/2020 0533   CO2 27 11/27/2020 0533   BUN 10 11/27/2020 0533   BUN 5 (L) 05/12/2020 1115   CREATININE 0.65 11/27/2020 0533   GLUCOSE 94 11/27/2020 0533   CALCIUM 8.8 (L) 11/27/2020 0533   CBC:    Component Value Date/Time   WBC 18.2 (H) 11/27/2020 0533   HGB 9.5 (L) 11/27/2020 0533   HGB 12.2 (L) 05/12/2020 1115   HCT 26.9 (L) 11/27/2020 0533   HCT 35.6 (L) 05/12/2020 1115   PLT 416 (H) 11/27/2020 0533   PLT 378 05/12/2020 1115   MCV 86.5 11/27/2020 0533   MCV 89 05/12/2020 1115   NEUTROABS 5.7 11/26/2020 1023   NEUTROABS 4.8 05/12/2020 1115   LYMPHSABS 2.1 11/26/2020 1023   LYMPHSABS 1.4 05/12/2020 1115   MONOABS 0.6 11/26/2020 1023   EOSABS 0.2 11/26/2020 1023   EOSABS 0.1 05/12/2020 1115   BASOSABS 0.0 11/26/2020 1023   BASOSABS 0.0 05/12/2020 1115    Recent Results (from the past 240 hour(s))  Resp Panel by RT-PCR (Flu A&B, Covid) Nasopharyngeal Swab     Status: None   Collection  Time: 11/26/20 12:49 PM   Specimen: Nasopharyngeal Swab; Nasopharyngeal(NP) swabs in vial transport medium  Result Value Ref Range Status   SARS Coronavirus 2 by RT PCR NEGATIVE NEGATIVE Final    Comment: (NOTE) SARS-CoV-2 target nucleic acids are NOT DETECTED.  The SARS-CoV-2 RNA is generally detectable in upper respiratory specimens during the acute phase of infection. The lowest concentration of SARS-CoV-2 viral copies this assay can detect is 138 copies/mL. A negative result does not preclude SARS-Cov-2 infection and should not be used as the sole basis for treatment or other patient management  decisions. A negative result may occur with  improper specimen collection/handling, submission of specimen other than nasopharyngeal swab, presence of viral mutation(s) within the areas targeted by this assay, and inadequate number of viral copies(<138 copies/mL). A negative result must be combined with clinical observations, patient history, and epidemiological information. The expected result is Negative.  Fact Sheet for Patients:  EntrepreneurPulse.com.au  Fact Sheet for Healthcare Providers:  IncredibleEmployment.be  This test is no t yet approved or cleared by the [place] FDA and  has been authorized for detection and/or diagnosis of SARS-CoV-2 by FDA under an Emergency Use Authorization (EUA). This EUA will remain  in effect (meaning this test can be used) for the duration of the COVID-19 declaration under Section 564(b)(1) of the Act, 21 U.S.C.section 360bbb-3(b)(1), unless the authorization is terminated  or revoked sooner.       Influenza A by PCR NEGATIVE NEGATIVE Final   Influenza B by PCR NEGATIVE NEGATIVE Final    Comment: (NOTE) The Xpert Xpress SARS-CoV-2/FLU/RSV plus assay is intended as an aid in the diagnosis of influenza from Nasopharyngeal swab specimens and should not be used as a sole basis for treatment. Nasal washings and aspirates are unacceptable for Xpert Xpress SARS-CoV-2/FLU/RSV testing.  Fact Sheet for Patients: EntrepreneurPulse.com.au  Fact Sheet for Healthcare Providers: IncredibleEmployment.be  This test is not yet approved or cleared by the [place] FDA and has been authorized for detection and/or diagnosis of SARS-CoV-2 by FDA under an Emergency Use Authorization (EUA). This EUA will remain in effect (meaning this test can be used) for the duration of the COVID-19 declaration under Section 564(b)(1) of the Act, 21 U.S.C. section 360bbb-3(b)(1), unless the  authorization is terminated or revoked.  Performed at [facility], [place] [address] [place], [place] [address]     Studies/Results: No results found.  Medications: Scheduled Meds:  cholecalciferol  2,000 Units Oral Daily   folic acid  1 mg Oral Daily   gabapentin  300 mg Oral TID   HYDROmorphone   Intravenous Q4H   hydroxyurea  500 mg Oral BID   ketorolac  15 mg Intravenous Q6H   oxyCODONE  20 mg Oral Q12H   rivaroxaban  20 mg Oral Q supper   senna-docusate  1 tablet Oral QHS   Continuous Infusions:  sodium chloride 100 mL/hr at 11/27/20 0206   promethazine (PHENERGAN) injection (IM or IVPB)     PRN Meds:.diphenhydrAMINE, hydrOXYzine, lip balm, naloxone **AND** sodium chloride flush, ondansetron (ZOFRAN) IV, polyethylene glycol, promethazine (PHENERGAN) injection (IM or IVPB)  Consultants: none  Procedures: none  Antibiotics: none  Assessment/Plan: Principal Problem:   Sickle cell pain crisis ([place]) Active Problems:   Chronic pain syndrome   DVT (deep venous thrombosis) (HCC)   History of DVT (deep vein thrombosis)  Sickle cell disease with pain crisis: Continue IV fluids, 0.45% saline at 100 mL/h Continue IV Dilaudid PCA, no changes in settings  today Toradol 15 mg every 6 hours for total of 5 days OxyContin 20 mg every 12 hours Monitor vital signs very closely, reevaluate pain scale regularly, and supplemental oxygen as needed  Anemia of chronic disease: Hemoglobin is stable and consistent with patient's baseline.  There is no clinical occasion for blood transfusion at this time.  Continue to follow closely.  Intractable nausea/vomiting: Nausea and vomiting uncontrolled on IV Zofran.  Initiate promethazine 12.5 mg IVPB every 6 hours as needed, use alternatively.  Advance diet as tolerated.  Chronic pain syndrome: Continue home medications  History of PE: Continue Xarelto Hb Sickle Cell Disease with crisis: Continue IVF D5 .45% Saline @ 125  mls/hour, continue weight based Dilaudid PCA, IV Toradol 30 mg Q 6 H, Monitor vitals very closely, Re-evaluate pain scale regularly, 2 L of Oxygen by [place]. Leukocytosis:  Sickle Cell Anemia:  Chronic pain Syndrome:  Medication non-compliance  Code Status: Full Code Family Communication: N/A Disposition Plan: Not yet ready for discharge  Cammie Sickle  If 7PM-7AM, please contact night-coverage.  11/27/2020, 9:55 AM  LOS: 1 day

## 2020-11-27 NOTE — Progress Notes (Signed)
Patient constantly screaming out stating in pain or vomiting. Patient stated on one incident that he was throwing up blood. Check trash bin where patient stated he vomited and no blood noticed. Will reevaluate. Patient also states that the pain medication ordered is not working. Patient stated that he needed medication for sleep, informed provider, medication ordered for sleep. Patient, then stated that the sleep medication administered was vomited back up. Informed physician of incident. Patient continues to make noises and talks with no one in the room. Will continue to implement safety measures and continue to monitor patient.

## 2020-11-28 DIAGNOSIS — G894 Chronic pain syndrome: Secondary | ICD-10-CM | POA: Diagnosis not present

## 2020-11-28 DIAGNOSIS — Z86718 Personal history of other venous thrombosis and embolism: Secondary | ICD-10-CM

## 2020-11-28 DIAGNOSIS — D5700 Hb-SS disease with crisis, unspecified: Secondary | ICD-10-CM | POA: Diagnosis not present

## 2020-11-28 NOTE — Progress Notes (Signed)
Patient ID: Timothy Marks, male   DOB: August 11, 1990, 30 y.o.   MRN: [id number] Subjective: Timothy Marks is a 30 year old male with a medical history significant for sickle cell disease, chronic pain syndrome, opiate dependence and tolerance, history of DVT on Xarelto, and history of anemia of chronic disease that was admitted for sickle cell pain crisis.  Patient has no new complaint today.  He continues to complain of significant pain in his lower back.  It appears vomiting has subsided.  He still rates his pain at 7/10, characterized as throbbing and achy.  He denies any headache, cough, chest pain, urinary symptoms, constipation or diarrhea.  Objective:  Vital signs in last 24 hours:  Vitals:   11/28/20 0346 11/28/20 0454 11/28/20 0815 11/28/20 1243  BP:  (!) 129/55    Pulse:  84    Resp: 13 14 16 16   Temp:  98.4 F (36.9 C)    TempSrc:  Oral    SpO2: 93% 94% 93% (!) 61%  Weight:      Height:        Intake/Output from previous day:   Intake/Output Summary (Last 24 hours) at 11/28/2020 1250 Last data filed at 11/28/2020 1100 Gross per 24 hour  Intake 530 ml  Output 1325 ml  Net -795 ml    Physical Exam: General: Alert, awake, oriented x3, in no acute distress.  Thin built HEENT: [place]/AT PEERL, EOMI Neck: Trachea midline,  no masses, no thyromegal,y no JVD, no carotid bruit OROPHARYNX:  Moist, No exudate/ erythema/lesions.  Heart: Regular rate and rhythm, without murmurs, rubs, gallops, PMI non-displaced, no heaves or thrills on palpation.  Lungs: Clear to auscultation, no wheezing or rhonchi noted. No increased vocal fremitus resonant to percussion  Abdomen: Soft, nontender, nondistended, positive bowel sounds, no masses no hepatosplenomegaly noted..  Neuro: No focal neurological deficits noted cranial nerves II through XII grossly intact. DTRs 2+ bilaterally upper and lower extremities. Strength 5 out of 5 in bilateral upper and lower extremities. Musculoskeletal: No warm swelling  or erythema around joints, no spinal tenderness noted. Psychiatric: Patient alert and oriented x3, good insight and cognition, good recent to remote recall. Lymph node survey: No cervical axillary or inguinal lymphadenopathy noted.  Lab Results:  Basic Metabolic Panel:    Component Value Date/Time   NA 136 11/27/2020 0533   NA 140 05/12/2020 1115   K 3.9 11/27/2020 0533   CL 98 11/27/2020 0533   CO2 27 11/27/2020 0533   BUN 10 11/27/2020 0533   BUN 5 (L) 05/12/2020 1115   CREATININE 0.65 11/27/2020 0533   GLUCOSE 94 11/27/2020 0533   CALCIUM 8.8 (L) 11/27/2020 0533   CBC:    Component Value Date/Time   WBC 18.2 (H) 11/27/2020 0533   HGB 9.5 (L) 11/27/2020 0533   HGB 12.2 (L) 05/12/2020 1115   HCT 26.9 (L) 11/27/2020 0533   HCT 35.6 (L) 05/12/2020 1115   PLT 416 (H) 11/27/2020 0533   PLT 378 05/12/2020 1115   MCV 86.5 11/27/2020 0533   MCV 89 05/12/2020 1115   NEUTROABS 5.7 11/26/2020 1023   NEUTROABS 4.8 05/12/2020 1115   LYMPHSABS 2.1 11/26/2020 1023   LYMPHSABS 1.4 05/12/2020 1115   MONOABS 0.6 11/26/2020 1023   EOSABS 0.2 11/26/2020 1023   EOSABS 0.1 05/12/2020 1115   BASOSABS 0.0 11/26/2020 1023   BASOSABS 0.0 05/12/2020 1115    Recent Results (from the past 240 hour(s))  Resp Panel by RT-PCR (Flu A&B, Covid) Nasopharyngeal Swab  Status: None   Collection Time: 11/26/20 12:49 PM   Specimen: Nasopharyngeal Swab; Nasopharyngeal(NP) swabs in vial transport medium  Result Value Ref Range Status   SARS Coronavirus 2 by RT PCR NEGATIVE NEGATIVE Final    Comment: (NOTE) SARS-CoV-2 target nucleic acids are NOT DETECTED.  The SARS-CoV-2 RNA is generally detectable in upper respiratory specimens during the acute phase of infection. The lowest concentration of SARS-CoV-2 viral copies this assay can detect is 138 copies/mL. A negative result does not preclude SARS-Cov-2 infection and should not be used as the sole basis for treatment or other patient management  decisions. A negative result may occur with  improper specimen collection/handling, submission of specimen other than nasopharyngeal swab, presence of viral mutation(s) within the areas targeted by this assay, and inadequate number of viral copies(<138 copies/mL). A negative result must be combined with clinical observations, patient history, and epidemiological information. The expected result is Negative.  Fact Sheet for Patients:  EntrepreneurPulse.com.au  Fact Sheet for Healthcare Providers:  IncredibleEmployment.be  This test is no t yet approved or cleared by the [place] FDA and  has been authorized for detection and/or diagnosis of SARS-CoV-2 by FDA under an Emergency Use Authorization (EUA). This EUA will remain  in effect (meaning this test can be used) for the duration of the COVID-19 declaration under Section 564(b)(1) of the Act, 21 U.S.C.section 360bbb-3(b)(1), unless the authorization is terminated  or revoked sooner.       Influenza A by PCR NEGATIVE NEGATIVE Final   Influenza B by PCR NEGATIVE NEGATIVE Final    Comment: (NOTE) The Xpert Xpress SARS-CoV-2/FLU/RSV plus assay is intended as an aid in the diagnosis of influenza from Nasopharyngeal swab specimens and should not be used as a sole basis for treatment. Nasal washings and aspirates are unacceptable for Xpert Xpress SARS-CoV-2/FLU/RSV testing.  Fact Sheet for Patients: EntrepreneurPulse.com.au  Fact Sheet for Healthcare Providers: IncredibleEmployment.be  This test is not yet approved or cleared by the [place] FDA and has been authorized for detection and/or diagnosis of SARS-CoV-2 by FDA under an Emergency Use Authorization (EUA). This EUA will remain in effect (meaning this test can be used) for the duration of the COVID-19 declaration under Section 564(b)(1) of the Act, 21 U.S.C. section 360bbb-3(b)(1), unless the  authorization is terminated or revoked.  Performed at [facility], [place] [address] [place], [place] [address]     Studies/Results: No results found.  Medications: Scheduled Meds:  cholecalciferol  2,000 Units Oral Daily   folic acid  1 mg Oral Daily   gabapentin  300 mg Oral TID   HYDROmorphone   Intravenous Q4H   hydroxyurea  500 mg Oral BID   ketorolac  15 mg Intravenous Q6H   oxyCODONE  20 mg Oral Q12H   rivaroxaban  20 mg Oral Q supper   senna-docusate  1 tablet Oral QHS   Continuous Infusions:  sodium chloride 10 mL/hr at 11/28/20 0043   promethazine (PHENERGAN) injection (IM or IVPB) Stopped (11/27/20 1058)   PRN Meds:.diphenhydrAMINE, hydrOXYzine, lip balm, naloxone **AND** sodium chloride flush, ondansetron (ZOFRAN) IV, polyethylene glycol, promethazine (PHENERGAN) injection (IM or IVPB)  Consultants: None  Procedures: None  Antibiotics: None  Assessment/Plan: Principal Problem:   Sickle cell pain crisis (HCC) Active Problems:   Chronic pain syndrome   DVT (deep venous thrombosis) (HCC)   History of DVT (deep vein thrombosis)  Hb Sickle Cell Disease with crisis: Reduce IVF to [facility]. Continue weight based Dilaudid PCA at  current dose setting, IV Toradol 15 mg Q 6 H for total of 5 days, continue oral pain medications as ordered.  Monitor vitals very closely, Re-evaluate pain scale regularly, 2 L of Oxygen by [place]. Nausea and vomiting: Improving. Continue promethazine IVPB. Advance diet as tolerated. Sickle Cell Anemia: Hemoglobin remained stable at baseline today.  There is no clinical indication for blood transfusion.  We will continue to monitor very closely and repeat labs in AM. Chronic pain Syndrome: Continue home medications as ordered. History of PE: Continue Xarelto.  Code Status: Full Code Family Communication: N/A Disposition Plan: Not yet ready for discharge  Galia Rahm  If 7PM-7AM, please contact night-coverage.  11/28/2020, 12:50  PM  LOS: 2 days

## 2020-11-29 DIAGNOSIS — G894 Chronic pain syndrome: Secondary | ICD-10-CM | POA: Diagnosis not present

## 2020-11-29 DIAGNOSIS — D5700 Hb-SS disease with crisis, unspecified: Secondary | ICD-10-CM | POA: Diagnosis not present

## 2020-11-29 DIAGNOSIS — Z86718 Personal history of other venous thrombosis and embolism: Secondary | ICD-10-CM | POA: Diagnosis not present

## 2020-11-29 MED ORDER — HYDROMORPHONE 1 MG/ML IV SOLN
INTRAVENOUS | Status: DC
  Administered 2020-11-29: 30 mg via INTRAVENOUS
  Administered 2020-11-29: 5.2 mg via INTRAVENOUS
  Administered 2020-11-29: 7.4 mg via INTRAVENOUS
  Administered 2020-11-30: 2.4 mg via INTRAVENOUS
  Administered 2020-11-30: 6 mg via INTRAVENOUS
  Administered 2020-11-30: 1.6 mg via INTRAVENOUS
  Administered 2020-11-30: 4 mg via INTRAVENOUS
  Administered 2020-11-30: 1.2 mg via INTRAVENOUS
  Administered 2020-12-01: 10.4 mg via INTRAVENOUS
  Administered 2020-12-01: 5.2 mg via INTRAVENOUS
  Administered 2020-12-01: 30 mg via INTRAVENOUS
  Administered 2020-12-01: 5.6 mg via INTRAVENOUS
  Administered 2020-12-01: 3.2 mg via INTRAVENOUS
  Administered 2020-12-02: 1.2 mg via INTRAVENOUS
  Filled 2020-11-29 (×2): qty 30

## 2020-11-29 MED ORDER — MELATONIN 5 MG PO TABS
10.0000 mg | ORAL_TABLET | Freq: Every day | ORAL | Status: DC
  Administered 2020-11-29 – 2020-12-01 (×3): 10 mg via ORAL
  Filled 2020-11-29 (×4): qty 2

## 2020-11-29 NOTE — Significant Event (Signed)
Pt refused to allow NT to take a full set of vitals, refusing temp, SpO2, RR and didn't allow NT to scan bracelet

## 2020-11-29 NOTE — Progress Notes (Signed)
Patient ID: Kha Hari, male   DOB: 1991/01/04, 30 y.o.   MRN: [id number] Subjective: Streett is a 30 year old male with a medical history significant for sickle cell disease, chronic pain syndrome, opiate dependence and tolerance, history of DVT on Xarelto, and history of anemia of chronic disease that was admitted for sickle cell pain crisis.  Patient states that his pain is slightly improved today but still not close to baseline.  He continues to rate his pain at 7/10.  His pain is localized to her lower extremities and lower back.  He characterizes pain as throbbing and achy.  He denies any headache, fever, cough, chest pain, shortness of breath, urinary symptoms, nausea, vomiting or diarrhea.  Said his vomiting has resolved.  Objective:  Vital signs in last 24 hours:  Vitals:   11/29/20 0005 11/29/20 0158 11/29/20 0357 11/29/20 1000  BP:  126/69  120/68  Pulse:  (!) 107    Resp: 15  16   Temp:      TempSrc:      SpO2: 93%  93%   Weight:      Height:       Intake/Output from previous day:   Intake/Output Summary (Last 24 hours) at 11/29/2020 1216 Last data filed at 11/29/2020 0843 Gross per 24 hour  Intake 1298.94 ml  Output 200 ml  Net 1098.94 ml    Physical Exam: General: Alert, awake, oriented x3, in no acute distress.  Thin built HEENT: [place]/AT [place]ER[place], EOMI Neck: Trachea midline,  no masses, no thyromegal,y no JVD, no carotid bruit OROPHARYNX:  Moist, No exudate/ erythema/lesions.  Heart: Regular rate and rhythm, without murmurs, rubs, gallops, PMI non-displaced, no heaves or thrills on palpation.  Lungs: Clear to auscultation, no wheezing or rhonchi noted. No increased vocal fremitus resonant to percussion  Abdomen: Soft, nontender, nondistended, positive bowel sounds, no masses no hepatosplenomegaly noted..  Neuro: No focal neurological deficits noted cranial nerves II through XII grossly intact. DTRs 2+ bilaterally upper and lower extremities. Strength 5 out of 5 in  bilateral upper and lower extremities. Musculoskeletal: No warm swelling or erythema around joints, no spinal tenderness noted. Psychiatric: Patient alert and oriented x3, good insight and cognition, good recent to remote recall. Lymph node survey: No cervical axillary or inguinal lymphadenopathy noted.  Lab Results:  Basic Metabolic Panel:    Component Value Date/Time   NA 136 11/27/2020 0533   NA 140 05/12/2020 1115   K 3.9 11/27/2020 0533   CL 98 11/27/2020 0533   CO2 27 11/27/2020 0533   BUN 10 11/27/2020 0533   BUN 5 (L) 05/12/2020 1115   CREATININE 0.65 11/27/2020 0533   GLUCOSE 94 11/27/2020 0533   CALCIUM 8.8 (L) 11/27/2020 0533   CBC:    Component Value Date/Time   WBC 18.2 (H) 11/27/2020 0533   HGB 9.5 (L) 11/27/2020 0533   HGB 12.2 (L) 05/12/2020 1115   HCT 26.9 (L) 11/27/2020 0533   HCT 35.6 (L) 05/12/2020 1115   PLT 416 (H) 11/27/2020 0533   PLT 378 05/12/2020 1115   MCV 86.5 11/27/2020 0533   MCV 89 05/12/2020 1115   NEUTROABS 5.7 11/26/2020 1023   NEUTROABS 4.8 05/12/2020 1115   LYMPHSABS 2.1 11/26/2020 1023   LYMPHSABS 1.4 05/12/2020 1115   MONOABS 0.6 11/26/2020 1023   EOSABS 0.2 11/26/2020 1023   EOSABS 0.1 05/12/2020 1115   BASOSABS 0.0 11/26/2020 1023   BASOSABS 0.0 05/12/2020 1115    Recent Results (from the past 240 hour(s))  Resp Panel by RT-PCR (Flu A&B, Covid) Nasopharyngeal Swab     Status: None   Collection Time: 11/26/20 12:49 PM   Specimen: Nasopharyngeal Swab; Nasopharyngeal(NP) swabs in vial transport medium  Result Value Ref Range Status   SARS Coronavirus 2 by RT PCR NEGATIVE NEGATIVE Final    Comment: (NOTE) SARS-CoV-2 target nucleic acids are NOT DETECTED.  The SARS-CoV-2 RNA is generally detectable in upper respiratory specimens during the acute phase of infection. The lowest concentration of SARS-CoV-2 viral copies this assay can detect is 138 copies/mL. A negative result does not preclude SARS-Cov-2 infection and should  not be used as the sole basis for treatment or other patient management decisions. A negative result may occur with  improper specimen collection/handling, submission of specimen other than nasopharyngeal swab, presence of viral mutation(s) within the areas targeted by this assay, and inadequate number of viral copies(<138 copies/mL). A negative result must be combined with clinical observations, patient history, and epidemiological information. The expected result is Negative.  Fact Sheet for Patients:  EntrepreneurPulse.com.au  Fact Sheet for Healthcare Providers:  IncredibleEmployment.be  This test is no t yet approved or cleared by the [place] FDA and  has been authorized for detection and/or diagnosis of SARS-CoV-2 by FDA under an Emergency Use Authorization (EUA). This EUA will remain  in effect (meaning this test can be used) for the duration of the COVID-19 declaration under Section 564(b)(1) of the Act, 21 U.S.C.section 360bbb-3(b)(1), unless the authorization is terminated  or revoked sooner.       Influenza A by PCR NEGATIVE NEGATIVE Final   Influenza B by PCR NEGATIVE NEGATIVE Final    Comment: (NOTE) The Xpert Xpress SARS-CoV-2/FLU/RSV plus assay is intended as an aid in the diagnosis of influenza from Nasopharyngeal swab specimens and should not be used as a sole basis for treatment. Nasal washings and aspirates are unacceptable for Xpert Xpress SARS-CoV-2/FLU/RSV testing.  Fact Sheet for Patients: EntrepreneurPulse.com.au  Fact Sheet for Healthcare Providers: IncredibleEmployment.be  This test is not yet approved or cleared by the [place] FDA and has been authorized for detection and/or diagnosis of SARS-CoV-2 by FDA under an Emergency Use Authorization (EUA). This EUA will remain in effect (meaning this test can be used) for the duration of the COVID-19 declaration under  Section 564(b)(1) of the Act, 21 U.S.C. section 360bbb-3(b)(1), unless the authorization is terminated or revoked.  Performed at [facility], [place] [address] [place], [place] [address]     Studies/Results: No results found.  Medications: Scheduled Meds:  cholecalciferol  2,000 Units Oral Daily   folic acid  1 mg Oral Daily   gabapentin  300 mg Oral TID   HYDROmorphone   Intravenous Q4H   hydroxyurea  500 mg Oral BID   ketorolac  15 mg Intravenous Q6H   oxyCODONE  20 mg Oral Q12H   rivaroxaban  20 mg Oral Q supper   senna-docusate  1 tablet Oral QHS   Continuous Infusions:  sodium chloride 10 mL/hr at 11/28/20 0043   promethazine (PHENERGAN) injection (IM or IVPB) 12.5 mg (11/28/20 2111)   PRN Meds:.diphenhydrAMINE, hydrOXYzine, lip balm, naloxone **AND** sodium chloride flush, ondansetron (ZOFRAN) IV, polyethylene glycol, promethazine (PHENERGAN) injection (IM or IVPB)  Consultants: None  Procedures: None  Antibiotics: None  Assessment/Plan: Principal Problem:   Sickle cell pain crisis ([place]) Active Problems:   Chronic pain syndrome   DVT (deep venous thrombosis) (HCC)   History of DVT (deep vein thrombosis)  Hb Sickle  Cell Disease with crisis: Continue IV fluid at [facility].  Begin to wean weight based Dilaudid PCA, continue IV Toradol 15 mg Q 6 H for total of 5 days, continue oral pain medications as ordered. Monitor vitals very closely, Re-evaluate pain scale regularly, 2 L of Oxygen by [place]. Nausea and vomiting: Resolved.  Patient tolerating p.o. intake with normal vomiting. Continue promethazine IVPB as needed.  Continue general diet as tolerated. Sickle Cell Anemia: Hemoglobin remained stable at baseline today. There is no clinical indication for blood transfusion. We will continue to monitor very closely and repeat labs in AM. Chronic pain Syndrome: Continue home medications as ordered. History of PE: Continue Xarelto.  Code Status: Full Code Family  Communication: N/A Disposition Plan: Not yet ready for discharge  Tamy Accardo  If 7PM-7AM, please contact night-coverage.  11/29/2020, 12:16 PM  LOS: 3 days

## 2020-11-30 LAB — COMPREHENSIVE METABOLIC PANEL
ALT: 11 U/L (ref 0–44)
AST: 28 U/L (ref 15–41)
Albumin: 4.1 g/dL (ref 3.5–5.0)
Alkaline Phosphatase: 65 U/L (ref 38–126)
Anion gap: 7 (ref 5–15)
BUN: 23 mg/dL — ABNORMAL HIGH (ref 6–20)
CO2: 28 mmol/L (ref 22–32)
Calcium: 9.2 mg/dL (ref 8.9–10.3)
Chloride: 103 mmol/L (ref 98–111)
Creatinine, Ser: 0.78 mg/dL (ref 0.61–1.24)
GFR, Estimated: >60 mL/min (ref >60)
Glucose, Bld: 99 mg/dL (ref 70–99)
Potassium: 4.3 mmol/L (ref 3.5–5.1)
Sodium: 138 mmol/L (ref 135–145)
Total Bilirubin: 5 mg/dL — ABNORMAL HIGH (ref 0.3–1.2)
Total Protein: 7.2 g/dL (ref 6.5–8.1)

## 2020-11-30 LAB — CBC WITH DIFFERENTIAL/PLATELET
Abs Immature Granulocytes: 0.09 10*3/uL — ABNORMAL HIGH (ref 0.00–0.07)
Basophils Absolute: 0 10*3/uL (ref 0.0–0.1)
Basophils Relative: 0 %
Eosinophils Absolute: 0.5 10*3/uL (ref 0.0–0.5)
Eosinophils Relative: 3 %
HCT: 15.2 % — ABNORMAL LOW (ref 39.0–52.0)
Hemoglobin: 5.7 g/dL — CL (ref 13.0–17.0)
Immature Granulocytes: 1 %
Lymphocytes Relative: 21 %
Lymphs Abs: 3.2 10*3/uL (ref 0.7–4.0)
MCH: 31.5 pg (ref 26.0–34.0)
MCHC: 37.5 g/dL — ABNORMAL HIGH (ref 30.0–36.0)
MCV: 84 fL (ref 80.0–100.0)
Monocytes Absolute: 1.3 10*3/uL — ABNORMAL HIGH (ref 0.1–1.0)
Monocytes Relative: 9 %
Neutro Abs: 10.3 10*3/uL — ABNORMAL HIGH (ref 1.7–7.7)
Neutrophils Relative %: 66 %
Platelets: 360 10*3/uL (ref 150–400)
RBC: 1.81 MIL/uL — ABNORMAL LOW (ref 4.22–5.81)
RDW: 24.2 % — ABNORMAL HIGH (ref 11.5–15.5)
WBC: 15.5 10*3/uL — ABNORMAL HIGH (ref 4.0–10.5)
nRBC: 7.4 % — ABNORMAL HIGH (ref 0.0–0.2)

## 2020-11-30 LAB — PREPARE RBC (CROSSMATCH)

## 2020-11-30 MED ORDER — SODIUM CHLORIDE 0.9% IV SOLUTION: Freq: Once | INTRAVENOUS | Status: AC

## 2020-11-30 MED ORDER — SODIUM CHLORIDE 0.9% FLUSH
10.0000 mL | Freq: Two times a day (BID) | INTRAVENOUS | Status: DC
Start: 2020-11-30 — End: 2020-12-02
  Administered 2020-11-30: 10 mL

## 2020-11-30 MED ORDER — SODIUM CHLORIDE 0.9% FLUSH: 10.0000 mL | INTRAVENOUS | Status: DC | PRN

## 2020-11-30 MED ORDER — OXYCODONE HCL 5 MG PO TABS
10.0000 mg | ORAL_TABLET | ORAL | Status: DC | PRN
  Administered 2020-11-30 – 2020-12-02 (×2): 10 mg via ORAL
  Filled 2020-11-30 (×2): qty 2

## 2020-11-30 NOTE — Progress Notes (Signed)
Hydroxyurea (Hydrea) hold criteria: ANC < 2 Pltc < 80K in sickle-cell patients; < 100K in other patients Hgb <= 6 in sickle-cell patients; < 8 in other patients Reticulocytes < 80K when Hgb < 9  Will hold hydroxyurea for Hgb 5.7  Peggyann Juba, PharmD, BCPS 11/30/2020 6:56 AM Pharmacy: [phone]

## 2020-11-30 NOTE — Care Management Important Message (Signed)
Important Message  Patient Details IM Letter given to the Patient. Name: Timothy Marks MRN: [id number] Date of Birth: [birth year]   Medicare Important Message Given:  Yes     Kerin Salen 11/30/2020, 9:06 AM

## 2020-11-30 NOTE — Progress Notes (Signed)
    OVERNIGHT PROGRESS REPORT  Notified by RN for patient refusal to accept second unit of blood. Patient is aware of the risks of this decision.  Currently patient is refusing blood transfusion and Lab draws for hemoglobin level.   Gershon Cull MSNA MSN ACNPC-AG Acute Care Nurse Practitioner [place]

## 2020-11-30 NOTE — Progress Notes (Signed)
Nurse obtained blood from lab at 2030. When RN arrived to patient's room it was explained that I needed to repeat vitals to start next unit of blood. Patient requested that I repeat what I asked him prior to leaving for blood. The patient was in bed with his penis out and I asked him why was he exposed. He then covered himself with the blanket and I left the room to pick up blood. Upon return he began to explain why he was exposed. I attempted to redirect asking to obtain vitals to start blood. He was agitated that he was getting a 2nd unit and that he should have left earlier and that he could not get extra pain shot when requested on the previous shift. "This hospital is no better than [facility]". After he stopped speaking, I again asked him was he going to allow me to check his vitals and start his blood. He responded, " I am getting myself together, ain't that what you told me to do". I responded that I was going to check on another patient while he does that. I made the charge nurse aware. I will now make one more attempt to transfuse before the 30 minutes from pick up expires.

## 2020-11-30 NOTE — Progress Notes (Signed)
Nurse returned to patient's room with Timothy Marks, [place]. Upon entry I asked, "[name] are going to allow me to check your vitals and start your blood"? While picking his nose, he looked and said, "don't you see me doing something". I responded, "[name], that is a yes or no". He then responded, "you asking me that when you see me doing something". I responded, "okay I will take that as no". I thanked the nurse who accompanied me and I left the room and notified the Charge Nurse Ailene Ravel. I will also notify the attending for tonight.   Lab was called and I was instructed to return the unit.

## 2020-11-30 NOTE — Progress Notes (Signed)
Subjective: Timothy Marks is a 30 year old male with a medical history of sickle cell disease, chronic pain syndrome, opiate dependence and tolerance, history of DVT on Xarelto, oppositional defiance, and history of anemia of chronic disease was admitted for sickle cell pain crisis.  Patient is complaining of low back pain that has been worsening over the past several hours.  Patient is not maximizing his PCA Dilaudid.  Encouraged to use his Dilaudid.  He is requesting an IV Dilaudid bolus, request denied at this time.  Patient encouraged to also request oxycodone from his nurse. Today, patient's hemoglobin is decreased to 5.7 g/dL, which is below his baseline of 8-9 g/dL.  His oxygen saturation is 84% on room air.  Patient denies any headache, dizziness, shortness of breath, or urinary symptoms.  No nausea, vomiting, or diarrhea.  Objective:  Vital signs in last 24 hours:  Vitals:   11/30/20 0606 11/30/20 0943 11/30/20 1145 11/30/20 1201  BP: (!) 117/57  119/70   Pulse: 96  100   Resp: 18 16 12 14   Temp: 98.5 F (36.9 C)  99.2 F (37.3 C)   TempSrc: Oral  Oral   SpO2: (!) 84% 99% (!) 84% 95%  Weight:      Height:        Intake/Output from previous day:   Intake/Output Summary (Last 24 hours) at 11/30/2020 1225 Last data filed at 11/30/2020 1207 Gross per 24 hour  Intake 595 ml  Output 1275 ml  Net -680 ml    Physical Exam: General: Alert, awake, oriented x3, in no acute distress.  HEENT: [place]/AT PEERL, EOMI Neck: Trachea midline,  no masses, no thyromegal,y no JVD, no carotid bruit OROPHARYNX:  Moist, No exudate/ erythema/lesions.  Heart: Regular rate and rhythm, without murmurs, rubs, gallops, PMI non-displaced, no heaves or thrills on palpation.  Lungs: Clear to auscultation, no wheezing or rhonchi noted. No increased vocal fremitus resonant to percussion  Abdomen: Soft, nontender, nondistended, positive bowel sounds, no masses no hepatosplenomegaly noted..  Neuro: No  focal neurological deficits noted cranial nerves II through XII grossly intact. DTRs 2+ bilaterally upper and lower extremities. Strength 5 out of 5 in bilateral upper and lower extremities. Musculoskeletal: No warm swelling or erythema around joints, no spinal tenderness noted. Psychiatric: Patient alert and oriented x3, good insight and cognition, good recent to remote recall. Lymph node survey: No cervical axillary or inguinal lymphadenopathy noted.  Lab Results:  Basic Metabolic Panel:    Component Value Date/Time   NA 138 11/30/2020 0542   NA 140 05/12/2020 1115   K 4.3 11/30/2020 0542   CL 103 11/30/2020 0542   CO2 28 11/30/2020 0542   BUN 23 (H) 11/30/2020 0542   BUN 5 (L) 05/12/2020 1115   CREATININE 0.78 11/30/2020 0542   GLUCOSE 99 11/30/2020 0542   CALCIUM 9.2 11/30/2020 0542   CBC:    Component Value Date/Time   WBC 15.5 (H) 11/30/2020 0542   HGB 5.7 (LL) 11/30/2020 0542   HGB 12.2 (L) 05/12/2020 1115   HCT 15.2 (L) 11/30/2020 0542   HCT 35.6 (L) 05/12/2020 1115   PLT 360 11/30/2020 0542   PLT 378 05/12/2020 1115   MCV 84.0 11/30/2020 0542   MCV 89 05/12/2020 1115   NEUTROABS 10.3 (H) 11/30/2020 0542   NEUTROABS 4.8 05/12/2020 1115   LYMPHSABS 3.2 11/30/2020 0542   LYMPHSABS 1.4 05/12/2020 1115   MONOABS 1.3 (H) 11/30/2020 0542   EOSABS 0.5 11/30/2020 0542   EOSABS 0.1 05/12/2020 1115  BASOSABS 0.0 11/30/2020 0542   BASOSABS 0.0 05/12/2020 1115    Recent Results (from the past 240 hour(s))  Resp Panel by RT-PCR (Flu A&B, Covid) Nasopharyngeal Swab     Status: None   Collection Time: 11/26/20 12:49 PM   Specimen: Nasopharyngeal Swab; Nasopharyngeal(NP) swabs in vial transport medium  Result Value Ref Range Status   SARS Coronavirus 2 by RT PCR NEGATIVE NEGATIVE Final    Comment: (NOTE) SARS-CoV-2 target nucleic acids are NOT DETECTED.  The SARS-CoV-2 RNA is generally detectable in upper respiratory specimens during the acute phase of infection. The  lowest concentration of SARS-CoV-2 viral copies this assay can detect is 138 copies/mL. A negative result does not preclude SARS-Cov-2 infection and should not be used as the sole basis for treatment or other patient management decisions. A negative result may occur with  improper specimen collection/handling, submission of specimen other than nasopharyngeal swab, presence of viral mutation(s) within the areas targeted by this assay, and inadequate number of viral copies(<138 copies/mL). A negative result must be combined with clinical observations, patient history, and epidemiological information. The expected result is Negative.  Fact Sheet for Patients:  EntrepreneurPulse.com.au  Fact Sheet for Healthcare Providers:  IncredibleEmployment.be  This test is no t yet approved or cleared by the [place] FDA and  has been authorized for detection and/or diagnosis of SARS-CoV-2 by FDA under an Emergency Use Authorization (EUA). This EUA will remain  in effect (meaning this test can be used) for the duration of the COVID-19 declaration under Section 564(b)(1) of the Act, 21 U.S.C.section 360bbb-3(b)(1), unless the authorization is terminated  or revoked sooner.       Influenza A by PCR NEGATIVE NEGATIVE Final   Influenza B by PCR NEGATIVE NEGATIVE Final    Comment: (NOTE) The Xpert Xpress SARS-CoV-2/FLU/RSV plus assay is intended as an aid in the diagnosis of influenza from Nasopharyngeal swab specimens and should not be used as a sole basis for treatment. Nasal washings and aspirates are unacceptable for Xpert Xpress SARS-CoV-2/FLU/RSV testing.  Fact Sheet for Patients: EntrepreneurPulse.com.au  Fact Sheet for Healthcare Providers: IncredibleEmployment.be  This test is not yet approved or cleared by the [place] FDA and has been authorized for detection and/or diagnosis of SARS-CoV-2 by FDA under  an Emergency Use Authorization (EUA). This EUA will remain in effect (meaning this test can be used) for the duration of the COVID-19 declaration under Section 564(b)(1) of the Act, 21 U.S.C. section 360bbb-3(b)(1), unless the authorization is terminated or revoked.  Performed at [facility], [place] [address] [place], [place] [address]     Studies/Results: No results found.  Medications: Scheduled Meds:  sodium chloride   Intravenous Once   cholecalciferol  2,000 Units Oral Daily   folic acid  1 mg Oral Daily   gabapentin  300 mg Oral TID   HYDROmorphone   Intravenous Q4H   ketorolac  15 mg Intravenous Q6H   melatonin  10 mg Oral QHS   oxyCODONE  20 mg Oral Q12H   rivaroxaban  20 mg Oral Q supper   senna-docusate  1 tablet Oral QHS   sodium chloride flush  10-40 mL Intracatheter Q12H   Continuous Infusions:  sodium chloride 10 mL/hr at 11/29/20 1439   promethazine (PHENERGAN) injection (IM or IVPB) 12.5 mg (11/28/20 2111)   PRN Meds:.diphenhydrAMINE, hydrOXYzine, lip balm, naloxone **AND** sodium chloride flush, ondansetron (ZOFRAN) IV, oxyCODONE, polyethylene glycol, promethazine (PHENERGAN) injection (IM or IVPB), sodium chloride flush  Consultants:  None  Procedures: None  Antibiotics: None  Assessment/Plan: Principal Problem:   Sickle cell pain crisis (HCC) Active Problems:   Chronic pain syndrome   DVT (deep venous thrombosis) (HCC)   History of DVT (deep vein thrombosis)   Sickle cell disease with pain crisis: Continue IV Dilaudid PCA without any changes in settings today Continue Toradol 15 mg every 6 hours for total of 5 days Initiate oxycodone 10 mg every 4 hours as needed for severe breakthrough pain Monitor vital signs very closely, reevaluate pain scale regularly, and supplemental oxygen today.  Anemia of chronic disease: Patient's hemoglobin is 5.7.  Transfused 2 units PRBCs.  Follow labs.  Nausea and vomiting: Resolved.  Continue  promethazine IVPB every.: Continue Xarelto.  Oppositional defiance: Patient has a long history of maladaptive behaviors during hospitalization.  He is angry on today and states that his pain is poorly controlled.  However patient is not utilizing PCA appropriately, patient educated.  Code Status: Full Code Family Communication: N/A Disposition Plan: Not yet ready for discharge  [place], MSN, FNP-C Patient [facility] [address] [place], [place] [address] [phone]  If 5PM-7AM, please contact night-coverage.  11/30/2020, 12:25 PM  LOS: 4 days

## 2020-11-30 NOTE — Progress Notes (Signed)
Pt refused blood draw for type and screen. Explained to pt the importance and his Hgb results. Pt still denied stating that he was sleep and lab could come back at another time when he is up.

## 2020-11-30 NOTE — Progress Notes (Signed)
During shift change patient asked to be able to sleep and to not be interrupted at this time. Alert and following commands with no distress noted. Bedside report received from Lady Deutscher, RN.

## 2020-11-30 NOTE — Progress Notes (Signed)
VAST RN at bedside to assess newly placed midline per consult. Attempted with repositioning, tubing and dressing change, still found catheter to remain clotted. Line removed.   Patient still in need of second PIV, discussed vessel options with patient; patient refused another midline placement at this time. PIV placed with [place].

## 2020-11-30 NOTE — Progress Notes (Signed)
Date and time results received: 11/30/20 0608  Test: CBC Critical Value: Hgb 5.7  Name of Provider Notified: X. Blount, NP  Orders Received? Or Actions Taken?: Orders Received - See Orders for details  Orders in for Type and Screen and Transfuse RBCs.

## 2020-11-30 NOTE — Progress Notes (Signed)
Educated patient on PCA use for adequate pain control. Patient reports pain but has not used PCA often per usage of mg as recorded by pump. Explained to patient that he has to use pump as he needs it to get adequate pain control. Patient verbalized understanding but stated that he can't use it when he's sleeping. RN instructed patient to use it as he needs it while awake.

## 2020-12-01 ENCOUNTER — Other Ambulatory Visit: Payer: Self-pay | Admitting: Internal Medicine

## 2020-12-01 DIAGNOSIS — D5700 Hb-SS disease with crisis, unspecified: Secondary | ICD-10-CM | POA: Diagnosis not present

## 2020-12-01 LAB — TYPE AND SCREEN
ABO/RH(D): A POS
Antibody Screen: NEGATIVE
Donor AG Type: NEGATIVE
Donor AG Type: NEGATIVE
Unit division: 0
Unit division: 0

## 2020-12-01 LAB — BPAM RBC
Blood Product Expiration Date: 202211082359
Blood Product Expiration Date: 202211122359
ISSUE DATE / TIME: 202210171553
ISSUE DATE / TIME: 202210172030
Unit Type and Rh: 9500
Unit Type and Rh: 9500

## 2020-12-01 LAB — LACTATE DEHYDROGENASE: LDH: 323 U/L — ABNORMAL HIGH (ref 98–192)

## 2020-12-01 LAB — RETIC PANEL
Immature Retic Fract: 23.1 % — ABNORMAL HIGH (ref 2.3–15.9)
RBC.: 2.21 MIL/uL — ABNORMAL LOW (ref 4.22–5.81)
Retic Count, Absolute: 256 10*3/uL — ABNORMAL HIGH (ref 19.0–186.0)
Retic Ct Pct: 11.4 % — ABNORMAL HIGH (ref 0.4–3.1)
Reticulocyte Hemoglobin: 30.6 pg (ref >27.9)

## 2020-12-01 NOTE — Progress Notes (Signed)
Subjective: Timothy Marks is a 30 year old male with a medical history of sickle cell disease, chronic pain syndrome, opiate dependence and tolerance, history of DVT on Xarelto, oppositional defiance, and history of anemia of chronic disease was admitted for sickle cell pain crisis.  On yesterday, patient's hemoglobin was 5.7 on yesterday. Patient is refusing labs on today. Discussed the importance of following plan of care. He says that he is in too much pain to get blood drawn at this time.   Patient is complaining of low back pain. He rates pain as 7/10. Patient is very agitated today, he is upset that heating pad is not hot enough.  Patient denies any headache, dizziness, shortness of breath, or urinary symptoms.  No nausea, vomiting, or diarrhea.  Objective:  Vital signs in last 24 hours:  Vitals:   12/01/20 1416 12/01/20 1613 12/01/20 2211 12/01/20 2226  BP: 103/65   102/71  Pulse: 79   86  Resp: 15 15 16 17   Temp: 98.5 F (36.9 C)   99.5 F (37.5 C)  TempSrc: Oral   Oral  SpO2: 96% 93% 95% 97%  Weight:      Height:        Intake/Output from previous day:   Intake/Output Summary (Last 24 hours) at 12/01/2020 2300 Last data filed at 12/01/2020 1926 Gross per 24 hour  Intake 570.01 ml  Output 2500 ml  Net -1929.99 ml    Physical Exam: General: Alert, awake, oriented x3, in no acute distress.  HEENT: [place]/AT PEERL, EOMI Neck: Trachea midline,  no masses, no thyromegal,y no JVD, no carotid bruit OROPHARYNX:  Moist, No exudate/ erythema/lesions.  Heart: Regular rate and rhythm, without murmurs, rubs, gallops, PMI non-displaced, no heaves or thrills on palpation.  Lungs: Clear to auscultation, no wheezing or rhonchi noted. No increased vocal fremitus resonant to percussion  Abdomen: Soft, nontender, nondistended, positive bowel sounds, no masses no hepatosplenomegaly noted..  Neuro: No focal neurological deficits noted cranial nerves II through XII grossly intact. DTRs 2+  bilaterally upper and lower extremities. Strength 5 out of 5 in bilateral upper and lower extremities. Musculoskeletal: No warm swelling or erythema around joints, no spinal tenderness noted. Psychiatric: Patient alert and oriented x3, good insight and cognition, good recent to remote recall. Lymph node survey: No cervical axillary or inguinal lymphadenopathy noted.  Lab Results:  Basic Metabolic Panel:    Component Value Date/Time   NA 138 11/30/2020 0542   NA 140 05/12/2020 1115   K 4.3 11/30/2020 0542   CL 103 11/30/2020 0542   CO2 28 11/30/2020 0542   BUN 23 (H) 11/30/2020 0542   BUN 5 (L) 05/12/2020 1115   CREATININE 0.78 11/30/2020 0542   GLUCOSE 99 11/30/2020 0542   CALCIUM 9.2 11/30/2020 0542   CBC:    Component Value Date/Time   WBC 15.5 (H) 11/30/2020 0542   HGB 5.7 (LL) 11/30/2020 0542   HGB 12.2 (L) 05/12/2020 1115   HCT 15.2 (L) 11/30/2020 0542   HCT 35.6 (L) 05/12/2020 1115   PLT 360 11/30/2020 0542   PLT 378 05/12/2020 1115   MCV 84.0 11/30/2020 0542   MCV 89 05/12/2020 1115   NEUTROABS 10.3 (H) 11/30/2020 0542   NEUTROABS 4.8 05/12/2020 1115   LYMPHSABS 3.2 11/30/2020 0542   LYMPHSABS 1.4 05/12/2020 1115   MONOABS 1.3 (H) 11/30/2020 0542   EOSABS 0.5 11/30/2020 0542   EOSABS 0.1 05/12/2020 1115   BASOSABS 0.0 11/30/2020 0542   BASOSABS 0.0 05/12/2020 1115  Recent Results (from the past 240 hour(s))  Resp Panel by RT-PCR (Flu A&B, Covid) Nasopharyngeal Swab     Status: None   Collection Time: 11/26/20 12:49 PM   Specimen: Nasopharyngeal Swab; Nasopharyngeal(NP) swabs in vial transport medium  Result Value Ref Range Status   SARS Coronavirus 2 by RT PCR NEGATIVE NEGATIVE Final    Comment: (NOTE) SARS-CoV-2 target nucleic acids are NOT DETECTED.  The SARS-CoV-2 RNA is generally detectable in upper respiratory specimens during the acute phase of infection. The lowest concentration of SARS-CoV-2 viral copies this assay can detect is 138 copies/mL.  A negative result does not preclude SARS-Cov-2 infection and should not be used as the sole basis for treatment or other patient management decisions. A negative result may occur with  improper specimen collection/handling, submission of specimen other than nasopharyngeal swab, presence of viral mutation(s) within the areas targeted by this assay, and inadequate number of viral copies(<138 copies/mL). A negative result must be combined with clinical observations, patient history, and epidemiological information. The expected result is Negative.  Fact Sheet for Patients:  EntrepreneurPulse.com.au  Fact Sheet for Healthcare Providers:  IncredibleEmployment.be  This test is no t yet approved or cleared by the [place] FDA and  has been authorized for detection and/or diagnosis of SARS-CoV-2 by FDA under an Emergency Use Authorization (EUA). This EUA will remain  in effect (meaning this test can be used) for the duration of the COVID-19 declaration under Section 564(b)(1) of the Act, 21 U.S.C.section 360bbb-3(b)(1), unless the authorization is terminated  or revoked sooner.       Influenza A by PCR NEGATIVE NEGATIVE Final   Influenza B by PCR NEGATIVE NEGATIVE Final    Comment: (NOTE) The Xpert Xpress SARS-CoV-2/FLU/RSV plus assay is intended as an aid in the diagnosis of influenza from Nasopharyngeal swab specimens and should not be used as a sole basis for treatment. Nasal washings and aspirates are unacceptable for Xpert Xpress SARS-CoV-2/FLU/RSV testing.  Fact Sheet for Patients: EntrepreneurPulse.com.au  Fact Sheet for Healthcare Providers: IncredibleEmployment.be  This test is not yet approved or cleared by the [place] FDA and has been authorized for detection and/or diagnosis of SARS-CoV-2 by FDA under an Emergency Use Authorization (EUA). This EUA will remain in effect (meaning this test  can be used) for the duration of the COVID-19 declaration under Section 564(b)(1) of the Act, 21 U.S.C. section 360bbb-3(b)(1), unless the authorization is terminated or revoked.  Performed at [facility], [place] [address], [place], [place] [address]     Studies/Results: No results found.  Medications: Scheduled Meds:  cholecalciferol  2,000 Units Oral Daily   folic acid  1 mg Oral Daily   gabapentin  300 mg Oral TID   HYDROmorphone   Intravenous Q4H   melatonin  10 mg Oral QHS   oxyCODONE  20 mg Oral Q12H   rivaroxaban  20 mg Oral Q supper   senna-docusate  1 tablet Oral QHS   sodium chloride flush  10-40 mL Intracatheter Q12H   Continuous Infusions:  sodium chloride 10 mL/hr at 11/30/20 1800   promethazine (PHENERGAN) injection (IM or IVPB) 12.5 mg (11/28/20 2111)   PRN Meds:.diphenhydrAMINE, hydrOXYzine, lip balm, naloxone **AND** sodium chloride flush, ondansetron (ZOFRAN) IV, oxyCODONE, polyethylene glycol, promethazine (PHENERGAN) injection (IM or IVPB), sodium chloride flush  Consultants: None  Procedures: None  Antibiotics: None  Assessment/Plan: Principal Problem:   Sickle cell pain crisis ([place]) Active Problems:   Chronic pain syndrome   DVT (deep  venous thrombosis) (HCC)   History of DVT (deep vein thrombosis)   Socially inappropriate behavior   Sickle cell disease with pain crisis: Continue IV Dilaudid PCA without any changes in settings today Continue Toradol 15 mg every 6 hours for total of 5 days Initiate oxycodone 10 mg every 4 hours as needed for severe breakthrough pain Monitor vital signs very closely, reevaluate pain scale regularly, and supplemental oxygen today.  Anemia of chronic disease: Patient's hemoglobin on yesterday was 5.7. Transfused 2 units PRBCs.He is refusing labs at this time.   Attempt to recheck labs at a later time.  Nausea and vomiting: Resolved.  Continue promethazine IVPB every.: Continue  Xarelto.  Oppositional defiance: Patient has a long history of maladaptive behaviors during hospitalization.  He is angry on today and states that his pain is poorly controlled.     Code Status: Full Code Family Communication: N/A Disposition Plan: Not yet ready for discharge  [place], MSN, FNP-C Patient [facility] [address] [place], [place] [address] [phone]  If 5PM-8AM, please contact night-coverage.  12/01/2020, 11:00 PM  LOS: 5 days

## 2020-12-01 NOTE — Plan of Care (Signed)
  Problem: Education: Goal: Knowledge of vaso-occlusive preventative measures will improve Outcome: Progressing Goal: Awareness of infection prevention will improve Outcome: Progressing Goal: Awareness of signs and symptoms of anemia will improve Outcome: Progressing Goal: Long-term complications will improve Outcome: Progressing   Problem: Self-Care: Goal: Ability to incorporate actions that prevent/reduce pain crisis will improve Outcome: Progressing   Problem: Bowel/Gastric: Goal: Gut motility will be maintained Outcome: Progressing   Problem: Tissue Perfusion: Goal: Complications related to inadequate tissue perfusion will be avoided or minimized Outcome: Progressing   Problem: Respiratory: Goal: Pulmonary complications will be avoided or minimized Outcome: Progressing Goal: Acute Chest Syndrome will be identified early to prevent complications Outcome: Progressing

## 2020-12-02 ENCOUNTER — Other Ambulatory Visit: Payer: Self-pay | Admitting: Internal Medicine

## 2020-12-02 DIAGNOSIS — D5700 Hb-SS disease with crisis, unspecified: Secondary | ICD-10-CM | POA: Diagnosis not present

## 2020-12-02 LAB — BASIC METABOLIC PANEL
Anion gap: 8 (ref 5–15)
BUN: 12 mg/dL (ref 6–20)
CO2: 23 mmol/L (ref 22–32)
Calcium: 9.1 mg/dL (ref 8.9–10.3)
Chloride: 104 mmol/L (ref 98–111)
Creatinine, Ser: 0.54 mg/dL — ABNORMAL LOW (ref 0.61–1.24)
GFR, Estimated: >60 mL/min (ref >60)
Glucose, Bld: 100 mg/dL — ABNORMAL HIGH (ref 70–99)
Potassium: 4.4 mmol/L (ref 3.5–5.1)
Sodium: 135 mmol/L (ref 135–145)

## 2020-12-02 LAB — CBC
HCT: 20.3 % — ABNORMAL LOW (ref 39.0–52.0)
Hemoglobin: 7.1 g/dL — ABNORMAL LOW (ref 13.0–17.0)
MCH: 32.4 pg (ref 26.0–34.0)
MCHC: 35 g/dL (ref 30.0–36.0)
MCV: 92.7 fL (ref 80.0–100.0)
Platelets: 447 10*3/uL — ABNORMAL HIGH (ref 150–400)
RBC: 2.19 MIL/uL — ABNORMAL LOW (ref 4.22–5.81)
RDW: 24.1 % — ABNORMAL HIGH (ref 11.5–15.5)
WBC: 11.9 10*3/uL — ABNORMAL HIGH (ref 4.0–10.5)
nRBC: 26.9 % — ABNORMAL HIGH (ref 0.0–0.2)

## 2020-12-02 MED ORDER — OXYCODONE-ACETAMINOPHEN 10-325 MG PO TABS: 1.0000 | ORAL_TABLET | ORAL | 0 refills | Status: DC | PRN

## 2020-12-02 NOTE — Discharge Summary (Signed)
Physician Discharge Summary  Timothy Marks VOJ:[id number] DOB: 1990/07/22 DOA: 11/26/2020  PCP: Timothy Garter, MD  Admit date: 11/26/2020  Discharge date: 12/02/2020  Discharge Diagnoses:  Principal Problem:   Sickle cell pain crisis ([place]) Active Problems:   Chronic pain syndrome   DVT (deep venous thrombosis) (HCC)   History of DVT (deep vein thrombosis)   Socially inappropriate behavior   Discharge Condition: Stable  Disposition:   Follow-up Information     Timothy Garter, MD Follow up in 1 week(s).   Specialty: Internal Medicine Contact information: [place] [address] [phone]                Pt is discharged home in good condition and is to follow up with Timothy Garter, MD this week to have labs evaluated. Timothy Marks is instructed to increase activity slowly and balance with rest for the next few days, and use prescribed medication to complete treatment of pain  Diet: Regular Wt Readings from Last 3 Encounters:  11/26/20 62.5 kg  11/24/20 61.2 kg  10/29/20 61.2 kg    History of present illness:  Timothy Marks  is a 30 y.o. male with a medical history significant for sickle cell disease, chronic pain syndrome, opiate dependence and tolerance, and history of anemia of chronic disease that presented to the emergency department with complaints of low back pain over the past 3 days.  Patient was treated and evaluated in the emergency department on yesterday for this problem without sustained relief.  He says that he has been taking his all medications consistently.  Home medications consist of Percocet and OxyContin.  Patient attributes current pain crisis to changes in weather.  He currently rates his pain as 9/10 characterized as intermittent and throbbing.  He denies any fever, chills, headache, chest pain, or shortness of breath.  No urinary symptoms, nausea, vomiting, or diarrhea.  Patient has had no recent  falls or injuries.  He has had no sick contacts, recent travel, or known exposure to COVID-19.  ER Course: Vital signs show temperature 98.3 F, blood pressure 124/73, pulse rate 68, respirations 14, and oxygen saturation 98% on RA.  Complete blood count shows WBCs 8.7, hemoglobin 10.5 g/dL, and platelets 486,000.  Complete metabolic panel shows total bilirubin 1.9, otherwise unremarkable.  Patient's pain persists despite IV Dilaudid, IV fluids.  Notified to admit for further management of sickle cell pain crisis.    Hospital Course:  Sickle cell disease with pain crisis:  Patient was admitted for sickle cell pain crisis and managed appropriately with IVF, IV Dilaudid via PCA and IV Toradol, as well as other adjunct therapies per sickle cell pain management protocols.  Patient was slow to respond to pain medication regimen.  Prior to discharge, patient was requesting clinician assisted boluses of Dilaudid.  Patient was not maximizing IV Dilaudid PCA.  He was advised to resume his home medications.  Contacted patient's PCP, medication sent to his pharmacy.  Patient's pain intensity is 5/10. Anemia of chronic disease: Hemoglobin decreased to 5.7 g/dL during admission, which is below patient's baseline.  He is status post 2 units PRBCs.  Prior to discharge, patient's hemoglobin is 7.1 g/dL.  Advised to follow-up with PCP in 1 week to repeat CBC with differential and CMP.  Patient is very angry and belligerent at this time.  Advised to refrain from using profanity.  Patient was therefore discharged home today in a hemodynamically stable condition.   Timothy Marks  will follow-up with PCP within 1 week of this discharge.  We discussed the need for good hydration, monitoring of hydration status, avoidance of heat, cold, stress, and infection triggers. We discussed the need to be adherent with taking Hydrea and other home medications. Patient was reminded of the need to seek medical attention immediately if any  symptom of bleeding, anemia, or infection occurs.  Discharge Exam: Vitals:   12/02/20 1040 12/02/20 1053  BP:  105/61  Pulse:  68  Resp: 15 15  Temp:  98.1 F (36.7 C)  SpO2: 96% 96%   Vitals:   12/02/20 0225 12/02/20 0500 12/02/20 1040 12/02/20 1053  BP: 97/68   105/61  Pulse: 69   68  Resp: 16 14 15 15   Temp: 98.4 F (36.9 C)   98.1 F (36.7 C)  TempSrc: Oral   Oral  SpO2: 96% 95% 96% 96%  Weight:      Height:        General appearance : Awake, alert, not in any distress. Speech Clear. Not toxic looking HEENT: Atraumatic and Normocephalic, pupils equally reactive to light and accomodation Neck: Supple, no JVD. No cervical lymphadenopathy.  Chest: Good air entry bilaterally, no added sounds  CVS: S1 S2 regular, no murmurs.  Abdomen: Bowel sounds present, Non tender and not distended with no gaurding, rigidity or rebound. Extremities: B/L Lower Ext shows no edema, both legs are warm to touch Neurology: Awake alert, and oriented X 3, CN II-XII intact, Non focal Skin: No Rash  Discharge Instructions  Discharge Instructions     Discharge patient   Complete by: As directed    Discharge disposition: 01-Home or Self Care   Discharge patient date: 12/02/2020      Allergies as of 12/02/2020       Reactions   Buprenorphine Hcl Hives   Levaquin [levofloxacin] Itching   Meperidine Rash   Morphine Hives   Morphine And Related Hives   Tramadol Hives   Vancomycin Itching   Zosyn [piperacillin Sod-tazobactam So] Itching, Rash, Other (See Comments)   Has taken rocephin in the past        Medication List     TAKE these medications    cholecalciferol 25 MCG (1000 UNIT) tablet Commonly known as: VITAMIN D Take 2 tablets (2,000 Units total) by mouth daily.   Ensure Active High Protein Liqd Take 1 each by mouth 3 (three) times daily between meals. #90 cans of Ensure   folic acid 1 MG tablet Commonly known as: FOLVITE Take 1 tablet (1 mg total) by mouth daily.    gabapentin 300 MG capsule Commonly known as: NEURONTIN Take 1 capsule (300 mg total) by mouth 3 (three) times daily.   hydroxyurea 500 MG capsule Commonly known as: HYDREA Take 1 capsule (500 mg total) by mouth 2 (two) times daily. May take with food to minimize GI side effects.   hydrOXYzine 10 MG tablet Commonly known as: ATARAX/VISTARIL Take 1 tablet (10 mg total) by mouth 3 (three) times daily as needed for itching.   oxyCODONE-acetaminophen 10-325 MG tablet Commonly known as: Percocet Take 1 tablet by mouth every 6 (six) hours as needed for up to 15 days for pain.   OxyCONTIN 20 mg 12 hr tablet Generic drug: oxyCODONE Take 1 tablet (20 mg total) by mouth every 12 (twelve) hours.   rivaroxaban 20 MG Tabs tablet Commonly known as: Xarelto Take 1 tablet (20 mg total) by mouth daily with supper.  The results of significant diagnostics from this hospitalization (including imaging, microbiology, ancillary and laboratory) are listed below for reference.    Significant Diagnostic Studies: DG Chest 2 View  Result Date: 11/24/2020 CLINICAL DATA:  Sickle cell pain in his lower back x2 days. EXAM: CHEST - 2 VIEW COMPARISON:  September 11, 2020 FINDINGS: The heart size and mediastinal contours are within normal limits. Both lungs are clear. Dense osseous structures are seen consistent with the patient's history of sickle cell anemia. IMPRESSION: No active cardiopulmonary disease. Electronically Signed   By: Virgina Norfolk M.D.   On: 11/24/2020 18:44    Microbiology: Recent Results (from the past 240 hour(s))  Resp Panel by RT-PCR (Flu A&B, Covid) Nasopharyngeal Swab     Status: None   Collection Time: 11/26/20 12:49 PM   Specimen: Nasopharyngeal Swab; Nasopharyngeal(NP) swabs in vial transport medium  Result Value Ref Range Status   SARS Coronavirus 2 by RT PCR NEGATIVE NEGATIVE Final    Comment: (NOTE) SARS-CoV-2 target nucleic acids are NOT DETECTED.  The SARS-CoV-2 RNA  is generally detectable in upper respiratory specimens during the acute phase of infection. The lowest concentration of SARS-CoV-2 viral copies this assay can detect is 138 copies/mL. A negative result does not preclude SARS-Cov-2 infection and should not be used as the sole basis for treatment or other patient management decisions. A negative result may occur with  improper specimen collection/handling, submission of specimen other than nasopharyngeal swab, presence of viral mutation(s) within the areas targeted by this assay, and inadequate number of viral copies(<138 copies/mL). A negative result must be combined with clinical observations, patient history, and epidemiological information. The expected result is Negative.  Fact Sheet for Patients:  EntrepreneurPulse.com.au  Fact Sheet for Healthcare Providers:  IncredibleEmployment.be  This test is no t yet approved or cleared by the [place] FDA and  has been authorized for detection and/or diagnosis of SARS-CoV-2 by FDA under an Emergency Use Authorization (EUA). This EUA will remain  in effect (meaning this test can be used) for the duration of the COVID-19 declaration under Section 564(b)(1) of the Act, 21 U.S.C.section 360bbb-3(b)(1), unless the authorization is terminated  or revoked sooner.       Influenza A by PCR NEGATIVE NEGATIVE Final   Influenza B by PCR NEGATIVE NEGATIVE Final    Comment: (NOTE) The Xpert Xpress SARS-CoV-2/FLU/RSV plus assay is intended as an aid in the diagnosis of influenza from Nasopharyngeal swab specimens and should not be used as a sole basis for treatment. Nasal washings and aspirates are unacceptable for Xpert Xpress SARS-CoV-2/FLU/RSV testing.  Fact Sheet for Patients: EntrepreneurPulse.com.au  Fact Sheet for Healthcare Providers: IncredibleEmployment.be  This test is not yet approved or cleared by the  [place] FDA and has been authorized for detection and/or diagnosis of SARS-CoV-2 by FDA under an Emergency Use Authorization (EUA). This EUA will remain in effect (meaning this test can be used) for the duration of the COVID-19 declaration under Section 564(b)(1) of the Act, 21 U.S.C. section 360bbb-3(b)(1), unless the authorization is terminated or revoked.  Performed at [facility], [place] [address], [place], [place] [address]      Labs: Basic Metabolic Panel: Recent Labs  Lab 11/26/20 1023 11/27/20 0533 11/30/20 0542 12/02/20 0542  NA 138 136 138 135  K 4.0 3.9 4.3 4.4  CL 104 98 103 104  CO2 24 27 28 23   GLUCOSE 87 94 99 100*  BUN 5* 10 23* 12  CREATININE 0.53* 0.65  0.78 0.54*  CALCIUM 9.7 8.8* 9.2 9.1   Liver Function Tests: Recent Labs  Lab 11/26/20 1023 11/30/20 0542  AST 23 28  ALT 11 11  ALKPHOS 76 65  BILITOT 1.9* 5.0*  PROT 8.2* 7.2  ALBUMIN 4.8 4.1   No results for input(s): LIPASE, AMYLASE in the last 168 hours. No results for input(s): AMMONIA in the last 168 hours. CBC: Recent Labs  Lab 11/26/20 1023 11/27/20 0533 11/30/20 0542 12/02/20 0542  WBC 8.7 18.2* 15.5* 11.9*  NEUTROABS 5.7  --  10.3*  --   HGB 10.5* 9.5* 5.7* 7.1*  HCT 29.6* 26.9* 15.2* 20.3*  MCV 87.1 86.5 84.0 92.7  PLT 486* 416* 360 447*   Cardiac Enzymes: No results for input(s): CKTOTAL, CKMB, CKMBINDEX, TROPONINI in the last 168 hours. BNP: Invalid input(s): POCBNP CBG: No results for input(s): GLUCAP in the last 168 hours.  Time coordinating discharge: 30 minutes  Signed: [name]  APRN, MSN, FNP-C [facility] [address] [place], [place] [address] [phone]  [facility] 12/02/2020, 2:10 PM

## 2020-12-02 NOTE — Progress Notes (Signed)
Pt discharged home with all belongings. Attempted to complete discharge education with pt. Pt stated "I don't want your papers."

## 2020-12-09 ENCOUNTER — Other Ambulatory Visit: Payer: Self-pay

## 2020-12-09 DIAGNOSIS — G894 Chronic pain syndrome: Secondary | ICD-10-CM

## 2020-12-09 DIAGNOSIS — Z79891 Long term (current) use of opiate analgesic: Secondary | ICD-10-CM

## 2020-12-10 ENCOUNTER — Telehealth: Payer: Self-pay

## 2020-12-10 NOTE — Telephone Encounter (Signed)
Oxy contin

## 2020-12-11 ENCOUNTER — Other Ambulatory Visit: Payer: Self-pay | Admitting: Internal Medicine

## 2020-12-11 ENCOUNTER — Other Ambulatory Visit: Payer: Self-pay

## 2020-12-11 DIAGNOSIS — G894 Chronic pain syndrome: Secondary | ICD-10-CM

## 2020-12-11 DIAGNOSIS — Z79891 Long term (current) use of opiate analgesic: Secondary | ICD-10-CM

## 2020-12-13 ENCOUNTER — Other Ambulatory Visit: Payer: Self-pay

## 2020-12-13 DIAGNOSIS — G894 Chronic pain syndrome: Secondary | ICD-10-CM

## 2020-12-13 DIAGNOSIS — Z79891 Long term (current) use of opiate analgesic: Secondary | ICD-10-CM

## 2020-12-14 ENCOUNTER — Other Ambulatory Visit: Payer: Self-pay | Admitting: Internal Medicine

## 2020-12-14 ENCOUNTER — Other Ambulatory Visit: Payer: Self-pay

## 2020-12-14 DIAGNOSIS — Z79891 Long term (current) use of opiate analgesic: Secondary | ICD-10-CM

## 2020-12-14 DIAGNOSIS — G894 Chronic pain syndrome: Secondary | ICD-10-CM

## 2020-12-16 ENCOUNTER — Other Ambulatory Visit: Payer: Self-pay | Admitting: Internal Medicine

## 2020-12-16 DIAGNOSIS — G894 Chronic pain syndrome: Secondary | ICD-10-CM

## 2020-12-16 DIAGNOSIS — Z79891 Long term (current) use of opiate analgesic: Secondary | ICD-10-CM

## 2020-12-16 MED ORDER — OXYCONTIN 20 MG PO T12A: 20.0000 mg | EXTENDED_RELEASE_TABLET | Freq: Two times a day (BID) | ORAL | 0 refills | Status: DC

## 2020-12-16 MED ORDER — OXYCODONE-ACETAMINOPHEN 10-325 MG PO TABS: 1.0000 | ORAL_TABLET | ORAL | 0 refills | Status: DC | PRN

## 2020-12-29 ENCOUNTER — Other Ambulatory Visit: Payer: Self-pay

## 2020-12-31 NOTE — Telephone Encounter (Signed)
Oxycodone  °

## 2021-01-01 ENCOUNTER — Other Ambulatory Visit: Payer: Self-pay | Admitting: Internal Medicine

## 2021-01-01 MED ORDER — OXYCODONE-ACETAMINOPHEN 10-325 MG PO TABS: 1.0000 | ORAL_TABLET | Freq: Four times a day (QID) | ORAL | 0 refills | Status: DC | PRN

## 2021-01-12 ENCOUNTER — Other Ambulatory Visit: Payer: Self-pay | Admitting: Internal Medicine

## 2021-01-14 ENCOUNTER — Telehealth: Payer: Self-pay

## 2021-01-14 NOTE — Telephone Encounter (Signed)
Oxycodone Percocet  DUE tomorrow

## 2021-01-15 ENCOUNTER — Other Ambulatory Visit: Payer: Self-pay | Admitting: Internal Medicine

## 2021-01-15 DIAGNOSIS — G894 Chronic pain syndrome: Secondary | ICD-10-CM

## 2021-01-15 DIAGNOSIS — Z79891 Long term (current) use of opiate analgesic: Secondary | ICD-10-CM

## 2021-01-15 MED ORDER — OXYCONTIN 20 MG PO T12A: 20.0000 mg | EXTENDED_RELEASE_TABLET | Freq: Two times a day (BID) | ORAL | 0 refills | Status: DC

## 2021-01-15 MED ORDER — OXYCODONE-ACETAMINOPHEN 10-325 MG PO TABS: 1.0000 | ORAL_TABLET | Freq: Four times a day (QID) | ORAL | 0 refills | Status: DC | PRN

## 2021-01-20 ENCOUNTER — Non-Acute Institutional Stay (HOSPITAL_COMMUNITY)
Admission: AD | Admit: 2021-01-20 | Discharge: 2021-01-20 | Disposition: A | Discharge status: Home / Self Care | Payer: Medicare Other | Source: Ambulatory Visit | Attending: Internal Medicine | Admitting: Internal Medicine

## 2021-01-20 ENCOUNTER — Telehealth (HOSPITAL_COMMUNITY): Payer: Self-pay | Admitting: *Deleted

## 2021-01-20 DIAGNOSIS — F1120 Opioid dependence, uncomplicated: Secondary | ICD-10-CM | POA: Diagnosis not present

## 2021-01-20 DIAGNOSIS — D5700 Hb-SS disease with crisis, unspecified: Secondary | ICD-10-CM | POA: Diagnosis present

## 2021-01-20 DIAGNOSIS — D638 Anemia in other chronic diseases classified elsewhere: Secondary | ICD-10-CM | POA: Diagnosis not present

## 2021-01-20 DIAGNOSIS — G894 Chronic pain syndrome: Secondary | ICD-10-CM | POA: Diagnosis not present

## 2021-01-20 LAB — RETICULOCYTES
Immature Retic Fract: 42.5 % — ABNORMAL HIGH (ref 2.3–15.9)
RBC.: 3.42 MIL/uL — ABNORMAL LOW (ref 4.22–5.81)
Retic Count, Absolute: 284.9 10*3/uL — ABNORMAL HIGH (ref 19.0–186.0)
Retic Ct Pct: 8.3 % — ABNORMAL HIGH (ref 0.4–3.1)

## 2021-01-20 LAB — CBC WITH DIFFERENTIAL/PLATELET
Abs Immature Granulocytes: 0.1 10*3/uL — ABNORMAL HIGH (ref 0.00–0.07)
Basophils Absolute: 0.1 10*3/uL (ref 0.0–0.1)
Basophils Relative: 0 %
Eosinophils Absolute: 0.3 10*3/uL (ref 0.0–0.5)
Eosinophils Relative: 2 %
HCT: 29.7 % — ABNORMAL LOW (ref 39.0–52.0)
Hemoglobin: 10.1 g/dL — ABNORMAL LOW (ref 13.0–17.0)
Immature Granulocytes: 1 %
Lymphocytes Relative: 11 %
Lymphs Abs: 1.5 10*3/uL (ref 0.7–4.0)
MCH: 29.2 pg (ref 26.0–34.0)
MCHC: 34 g/dL (ref 30.0–36.0)
MCV: 85.8 fL (ref 80.0–100.0)
Monocytes Absolute: 1.1 10*3/uL — ABNORMAL HIGH (ref 0.1–1.0)
Monocytes Relative: 8 %
Neutro Abs: 10.2 10*3/uL — ABNORMAL HIGH (ref 1.7–7.7)
Neutrophils Relative %: 78 %
Platelets: 477 10*3/uL — ABNORMAL HIGH (ref 150–400)
RBC: 3.46 MIL/uL — ABNORMAL LOW (ref 4.22–5.81)
RDW: 20.4 % — ABNORMAL HIGH (ref 11.5–15.5)
WBC: 13.1 10*3/uL — ABNORMAL HIGH (ref 4.0–10.5)
nRBC: 1.1 % — ABNORMAL HIGH (ref 0.0–0.2)

## 2021-01-20 LAB — COMPREHENSIVE METABOLIC PANEL
ALT: 16 U/L (ref 0–44)
AST: 41 U/L (ref 15–41)
Albumin: 4.1 g/dL (ref 3.5–5.0)
Alkaline Phosphatase: 67 U/L (ref 38–126)
Anion gap: 5 (ref 5–15)
BUN: <5 mg/dL — ABNORMAL LOW (ref 6–20)
CO2: 30 mmol/L (ref 22–32)
Calcium: 8.6 mg/dL — ABNORMAL LOW (ref 8.9–10.3)
Chloride: 104 mmol/L (ref 98–111)
Creatinine, Ser: 0.53 mg/dL — ABNORMAL LOW (ref 0.61–1.24)
GFR, Estimated: >60 mL/min (ref >60)
Glucose, Bld: 87 mg/dL (ref 70–99)
Potassium: 2.8 mmol/L — ABNORMAL LOW (ref 3.5–5.1)
Sodium: 139 mmol/L (ref 135–145)
Total Bilirubin: 2.7 mg/dL — ABNORMAL HIGH (ref 0.3–1.2)
Total Protein: 7.3 g/dL (ref 6.5–8.1)

## 2021-01-20 MED ORDER — SODIUM CHLORIDE 0.9% FLUSH: 9.0000 mL | INTRAVENOUS | Status: DC | PRN

## 2021-01-20 MED ORDER — ONDANSETRON HCL 4 MG/2ML IJ SOLN
4.0000 mg | Freq: Four times a day (QID) | INTRAMUSCULAR | Status: DC | PRN
  Administered 2021-01-20 (×2): 4 mg via INTRAVENOUS
  Filled 2021-01-20 (×2): qty 2

## 2021-01-20 MED ORDER — DIPHENHYDRAMINE HCL 25 MG PO CAPS
25.0000 mg | ORAL_CAPSULE | Freq: Once | ORAL | Status: AC
  Administered 2021-01-20: 25 mg via ORAL
  Filled 2021-01-20: qty 1

## 2021-01-20 MED ORDER — KETOROLAC TROMETHAMINE 30 MG/ML IJ SOLN
15.0000 mg | Freq: Once | INTRAMUSCULAR | Status: AC
  Administered 2021-01-20: 15 mg via INTRAVENOUS
  Filled 2021-01-20: qty 1

## 2021-01-20 MED ORDER — ACETAMINOPHEN 500 MG PO TABS
1000.0000 mg | ORAL_TABLET | Freq: Once | ORAL | Status: AC
  Administered 2021-01-20: 1000 mg via ORAL
  Filled 2021-01-20: qty 2

## 2021-01-20 MED ORDER — NALOXONE HCL 0.4 MG/ML IJ SOLN: 0.4000 mg | INTRAMUSCULAR | Status: DC | PRN

## 2021-01-20 MED ORDER — HYDROMORPHONE 1 MG/ML IV SOLN
INTRAVENOUS | Status: DC
  Administered 2021-01-20: 16.5 mg via INTRAVENOUS
  Administered 2021-01-20: 30 mg via INTRAVENOUS
  Filled 2021-01-20: qty 30

## 2021-01-20 MED ORDER — SODIUM CHLORIDE 0.45 % IV SOLN: INTRAVENOUS | Status: DC

## 2021-01-20 NOTE — H&P (Signed)
[facility] History and Physical   Date: 01/20/2021  Patient name: Timothy Marks Medical record number: [id number] Date of birth: 28-Jan-1991 Age: 30 y.o. Gender: male PCP: Tresa Garter, MD  Attending physician: Tresa Garter, MD  Chief Complaint: Sickle cell pain  History of Present Illness: Timothy Marks is a 30 year old male with a medical history significant for sickle cell disease, chronic pain syndrome, opiate dependence and tolerance, and anemia of chronic disease presents with complaints of low back pain that is consistent with his typical pain crisis.  The patient attributes increased pain to changes in weather.  He rates his pain as 9/10 characterized as intermittent and aching.  He last had Percocet this a.m. without sustained relief.  He denies any headache, chest pain, shortness of breath, urinary symptoms, nausea, vomiting, or diarrhea.  No sick contacts, recent travel, or known exposure to COVID-19.  Meds: Medications Prior to Admission  Medication Sig Dispense Refill Last Dose   cholecalciferol (VITAMIN D) 1000 units tablet Take 2 tablets (2,000 Units total) by mouth daily. (Patient not taking: Reported on 11/26/2020) 30 tablet 6    folic acid (FOLVITE) 1 MG tablet Take 1 tablet (1 mg total) by mouth daily. 90 tablet 2    gabapentin (NEURONTIN) 300 MG capsule Take 1 capsule (300 mg total) by mouth 3 (three) times daily. 90 capsule 5    hydroxyurea (HYDREA) 500 MG capsule Take 1 capsule (500 mg total) by mouth 2 (two) times daily. May take with food to minimize GI side effects. 180 capsule 2    hydrOXYzine (ATARAX/VISTARIL) 10 MG tablet Take 1 tablet (10 mg total) by mouth 3 (three) times daily as needed for itching. (Patient not taking: Reported on 11/26/2020) 30 tablet 0    Nutritional Supplements (ENSURE ACTIVE HIGH PROTEIN) LIQD Take 1 each by mouth 3 (three) times daily between meals. #90 cans of Ensure (Patient not taking: No sig reported) 237  mL 11    oxyCODONE-acetaminophen (PERCOCET) 10-325 MG tablet Take 1 tablet by mouth every 6 (six) hours as needed for up to 15 days for pain. 60 tablet 0    OXYCONTIN 20 MG 12 hr tablet Take 1 tablet (20 mg total) by mouth every 12 (twelve) hours. 60 tablet 0    rivaroxaban (XARELTO) 20 MG TABS tablet Take 1 tablet (20 mg total) by mouth daily with supper. 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   Sickle cell anemia (HCC)    Vitamin D deficiency y-6   Past Surgical History:  Procedure Laterality Date   CHOLECYSTECTOMY     Family History  Problem Relation Age of Onset   Sickle cell trait Mother    Sickle cell trait Father    Social History   Socioeconomic History   Marital status: Single    Spouse name: Not on file   Number of children: Not on file   Years of education: Not on file   Highest education level: Not on file  Occupational History   Not on file  Tobacco Use   Smoking status: Former    Types: Cigarettes   Smokeless tobacco: Never  Vaping Use   Vaping Use: Never used  Substance  and Sexual Activity   Alcohol use: No   Drug use: Not Currently    Types: Marijuana   Sexual activity: Yes  Other Topics Concern   Not on file  Social History Narrative   Not on file   Social Determinants of Health   Financial Resource Strain: Not on file  Food Insecurity: Not on file  Transportation Needs: Not on file  Physical Activity: Not on file  Stress: Not on file  Social Connections: Not on file  Intimate Partner Violence: Not on file   Review of Systems  Constitutional:  Negative for chills and fever.  HENT: Negative.    Eyes: Negative.    Respiratory: Negative.    Cardiovascular: Negative.   Gastrointestinal: Negative.   Genitourinary: Negative.   Musculoskeletal:  Positive for back pain.  Skin: Negative.   Neurological: Negative.   Endo/Heme/Allergies: Negative.   Psychiatric/Behavioral: Negative.     Physical Exam: Blood pressure 126/75, pulse 87, temperature (!) 97.3 F (36.3 C), temperature source Temporal, resp. rate 18, SpO2 100 %. Physical Exam Constitutional:      Appearance: Normal appearance.  Eyes:     Pupils: Pupils are equal, round, and reactive to light.  Cardiovascular:     Rate and Rhythm: Normal rate and regular rhythm.  Pulmonary:     Effort: Pulmonary effort is normal.  Abdominal:     General: Bowel sounds are normal.  Skin:    General: Skin is warm.  Neurological:     General: No focal deficit present.     Mental Status: He is alert. Mental status is at baseline.  Psychiatric:        Mood and Affect: Mood normal.        Behavior: Behavior normal.        Thought Content: Thought content normal.        Judgment: Judgment normal.     Lab results: No results found for this or any previous visit (from the past 24 hour(s)).  Imaging results:  No results found.   Assessment & Plan: Patient admitted to sickle cell day infusion center for management of pain crisis.  Patient is opiate tolerant Initiate IV dilaudid PCA. IV fluids, 0.45% saline at 100 ml/hr Toradol 15 mg IV times one dose Tylenol 1000 mg by mouth times one dose Review CBC with differential, complete metabolic panel, and reticulocytes as results become available. Pain intensity will be reevaluated in context of functioning and relationship to baseline as care progresses If pain intensity remains elevated and/or sudden change in hemodynamic stability transition to inpatient services for higher level of care.     [name]  APRN, MSN, FNP-C [facility] [address] [place], [place] [address] [phone]  01/20/2021, 9:25 AM

## 2021-01-20 NOTE — Telephone Encounter (Signed)
Patient called requesting to come to the day hospital for sickle cell pain. Patient reports back pain rated 8/10. Reports taking Oxycodone at 5:30 am. COVID-19 screening done and patient denies all symptoms and exposures. Denies fever, chest pain, nausea, vomiting, diarrhea, abdominal pain and priapism. Admits to having transportation without driving self. Per patient, his mother will drive him. [place], [place] notified. Patient can come to the day hospital for pain management. Patient advised and expresses an understanding.

## 2021-01-20 NOTE — Discharge Summary (Signed)
[facility] Discharge Summary   Patient ID: Timothy Marks MRN: [id number] DOB/AGE: 32-May-1992 32 y.o.  Admit date: 01/20/2021 Discharge date: 01/20/2021  Primary Care Physician:  Tresa Garter, MD  Admission Diagnoses:  Principal Problem:   Sickle cell pain crisis [facility])   Discharge Medications:  Allergies as of 01/20/2021       Reactions   Buprenorphine Hcl Hives   Levaquin [levofloxacin] Itching   Meperidine Rash   Morphine Hives   Morphine And Related Hives   Tramadol Hives   Vancomycin Itching   Zosyn [piperacillin Sod-tazobactam So] Itching, Rash, Other (See Comments)   Has taken rocephin in the past        Medication List     TAKE these medications    cholecalciferol 25 MCG (1000 UNIT) tablet Commonly known as: VITAMIN D Take 2 tablets (2,000 Units total) by mouth daily.   Ensure Active High Protein Liqd Take 1 each by mouth 3 (three) times daily between meals. #90 cans of Ensure   folic acid 1 MG tablet Commonly known as: FOLVITE Take 1 tablet (1 mg total) by mouth daily.   gabapentin 300 MG capsule Commonly known as: NEURONTIN Take 1 capsule (300 mg total) by mouth 3 (three) times daily.   hydroxyurea 500 MG capsule Commonly known as: HYDREA Take 1 capsule (500 mg total) by mouth 2 (two) times daily. May take with food to minimize GI side effects.   hydrOXYzine 10 MG tablet Commonly known as: ATARAX Take 1 tablet (10 mg total) by mouth 3 (three) times daily as needed for itching.   oxyCODONE-acetaminophen 10-325 MG tablet Commonly known as: PERCOCET Take 1 tablet by mouth every 6 (six) hours as needed for up to 15 days for pain.   OxyCONTIN 20 mg 12 hr tablet Generic drug: oxyCODONE Take 1 tablet (20 mg total) by mouth every 12 (twelve) hours.   rivaroxaban 20 MG Tabs tablet Commonly known as: Xarelto Take 1 tablet (20 mg total) by mouth daily with supper.         Consults:  None  Significant Diagnostic  Studies:  No results found.  History of present illness:  Timothy Marks is a 30 year old male with a medical history significant for sickle cell disease, chronic pain syndrome, opiate dependence and tolerance, and anemia of chronic disease presents with complaints of low back pain that is consistent with his typical pain crisis.  The patient attributes increased pain to changes in weather.  He rates his pain as 9/10 characterized as intermittent and aching.  He last had Percocet this a.m. without sustained relief.  He denies any headache, chest pain, shortness of breath, urinary symptoms, nausea, vomiting, or diarrhea.  No sick contacts, recent travel, or known exposure to COVID-19.  [facility] Course: Patient mated to sickle cell day infusion clinic for management of pain crisis.  Reviewed all laboratory values, potassium decreased at 2.8.  Advised to follow-up potassium rich diet, follow-up with PCP in 1 week to repeat potassium level. All of the laboratory values are consistent with his baseline.  Pain managed with IV Dilaudid PCA. Toradol 15 mg IV x1 Benadryl 25 mg x 2 Tylenol 1000 mg x 1 Pain intensity decreased to 6/10.  Patient is requesting discharge home.  He is alert, oriented, and ambulating without assistance.  Patient will discharge home in a hemodynamically stable condition.  Discharge instructions: Resume all home medications.   Follow up with PCP as previously  scheduled.  Discussed the importance of drinking 64 ounces of water daily, dehydration of red blood cells may lead further sickling.   Avoid all stressors that precipitate sickle cell pain crisis.     The patient was given clear instructions to go to ER or return to medical center if symptoms do not improve, worsen or new problems develop.   Physical Exam at Discharge:  BP 114/67 (BP Location: Left Arm)   Pulse 71   Temp 97.8 F (36.6 C) (Temporal)   Resp 11   SpO2 97%   Physical  Exam Constitutional:      Appearance: Normal appearance.  Cardiovascular:     Rate and Rhythm: Normal rate and regular rhythm.     Pulses: Normal pulses.  Pulmonary:     Effort: Pulmonary effort is normal.  Abdominal:     General: Bowel sounds are normal.  Skin:    General: Skin is warm.  Neurological:     General: No focal deficit present.     Mental Status: He is alert. Mental status is at baseline.  Psychiatric:        Mood and Affect: Mood normal.        Behavior: Behavior normal.        Thought Content: Thought content normal.        Judgment: Judgment normal.     Disposition at Discharge: Discharge disposition: 01-Home or Self Care       Discharge Orders: Discharge Instructions     Discharge patient   Complete by: As directed    Discharge disposition: 01-Home or Self Care   Discharge patient date: 01/20/2021       Condition at Discharge:   Stable  Time spent on Discharge:  Greater than 30 minutes.  Signed: [name]  APRN, MSN, FNP-C [facility] [address] [place], [place] [address] [phone]  01/20/2021, 3:56 PM

## 2021-01-20 NOTE — Progress Notes (Signed)
Pt admitted to the day hospital for treatment of sickle cell pain crisis. Pt reported pain to back rated a 8/10. Pt given PO Benadryl and Tylenol, IV toradol and zofran,placed on Dilaudid PCA, and hydrated with IV fluids. At discharge patient reported pain a 6/10. Pt provided with AVS. Pt alert , oriented and ambulatory at discharge.

## 2021-01-25 ENCOUNTER — Other Ambulatory Visit: Payer: Self-pay | Admitting: Internal Medicine

## 2021-01-25 DIAGNOSIS — D571 Sickle-cell disease without crisis: Secondary | ICD-10-CM

## 2021-01-26 ENCOUNTER — Emergency Department
Admission: EM | Admit: 2021-01-26 | Discharge: 2021-01-27 | Disposition: A | Discharge status: Other Acute Inpatient Hospital | Payer: Medicare Other | Attending: Emergency Medicine | Admitting: Emergency Medicine

## 2021-01-26 ENCOUNTER — Emergency Department: Payer: Medicare Other

## 2021-01-26 ENCOUNTER — Other Ambulatory Visit: Payer: Self-pay

## 2021-01-26 DIAGNOSIS — R0789 Other chest pain: Secondary | ICD-10-CM | POA: Diagnosis not present

## 2021-01-26 DIAGNOSIS — Z20822 Contact with and (suspected) exposure to covid-19: Secondary | ICD-10-CM | POA: Diagnosis not present

## 2021-01-26 DIAGNOSIS — Z87891 Personal history of nicotine dependence: Secondary | ICD-10-CM | POA: Insufficient documentation

## 2021-01-26 DIAGNOSIS — D5700 Hb-SS disease with crisis, unspecified: Secondary | ICD-10-CM

## 2021-01-26 DIAGNOSIS — Z8616 Personal history of COVID-19: Secondary | ICD-10-CM | POA: Diagnosis not present

## 2021-01-26 DIAGNOSIS — R778 Other specified abnormalities of plasma proteins: Secondary | ICD-10-CM | POA: Insufficient documentation

## 2021-01-26 LAB — CBC
HCT: 25.7 % — ABNORMAL LOW (ref 39.0–52.0)
Hemoglobin: 8.7 g/dL — ABNORMAL LOW (ref 13.0–17.0)
MCH: 29.3 pg (ref 26.0–34.0)
MCHC: 33.9 g/dL (ref 30.0–36.0)
MCV: 86.5 fL (ref 80.0–100.0)
Platelets: 356 10*3/uL (ref 150–400)
RBC: 2.97 MIL/uL — ABNORMAL LOW (ref 4.22–5.81)
RDW: 20 % — ABNORMAL HIGH (ref 11.5–15.5)
WBC: 22.6 10*3/uL — ABNORMAL HIGH (ref 4.0–10.5)
nRBC: 5.4 % — ABNORMAL HIGH (ref 0.0–0.2)

## 2021-01-26 LAB — RETIC PANEL
Immature Retic Fract: 39.2 % — ABNORMAL HIGH (ref 2.3–15.9)
RBC.: 2.74 MIL/uL — ABNORMAL LOW (ref 4.22–5.81)
Retic Count, Absolute: 258.1 10*3/uL — ABNORMAL HIGH (ref 19.0–186.0)
Retic Ct Pct: 9.4 % — ABNORMAL HIGH (ref 0.4–3.1)
Reticulocyte Hemoglobin: 31.5 pg (ref >27.9)

## 2021-01-26 LAB — BASIC METABOLIC PANEL
Anion gap: 8 (ref 5–15)
BUN: 9 mg/dL (ref 6–20)
CO2: 26 mmol/L (ref 22–32)
Calcium: 9.3 mg/dL (ref 8.9–10.3)
Chloride: 101 mmol/L (ref 98–111)
Creatinine, Ser: 0.52 mg/dL — ABNORMAL LOW (ref 0.61–1.24)
GFR, Estimated: >60 mL/min (ref >60)
Glucose, Bld: 124 mg/dL — ABNORMAL HIGH (ref 70–99)
Potassium: 4.3 mmol/L (ref 3.5–5.1)
Sodium: 135 mmol/L (ref 135–145)

## 2021-01-26 LAB — TROPONIN I (HIGH SENSITIVITY)
Troponin I (High Sensitivity): 32 ng/L — ABNORMAL HIGH (ref <18)
Troponin I (High Sensitivity): 41 ng/L — ABNORMAL HIGH (ref <18)

## 2021-01-26 LAB — RESP PANEL BY RT-PCR (FLU A&B, COVID) ARPGX2
Influenza A by PCR: NEGATIVE
Influenza B by PCR: NEGATIVE
SARS Coronavirus 2 by RT PCR: NEGATIVE

## 2021-01-26 MED ORDER — HYDROMORPHONE HCL 1 MG/ML IJ SOLN
1.0000 mg | Freq: Once | INTRAMUSCULAR | Status: AC
  Administered 2021-01-26: 1 mg via INTRAVENOUS
  Filled 2021-01-26: qty 1

## 2021-01-26 MED ORDER — IOHEXOL 350 MG/ML SOLN
75.0000 mL | Freq: Once | INTRAVENOUS | Status: AC | PRN
  Administered 2021-01-26: 75 mL via INTRAVENOUS

## 2021-01-26 MED ORDER — KETOROLAC TROMETHAMINE 30 MG/ML IJ SOLN
15.0000 mg | Freq: Once | INTRAMUSCULAR | Status: AC
  Administered 2021-01-26: 15 mg via INTRAVENOUS
  Filled 2021-01-26 (×2): qty 1

## 2021-01-26 MED ORDER — SODIUM CHLORIDE 0.9 % IV BOLUS
250.0000 mL | Freq: Once | INTRAVENOUS | Status: AC
  Administered 2021-01-26: 250 mL via INTRAVENOUS

## 2021-01-26 MED ORDER — HYDROMORPHONE HCL 1 MG/ML IJ SOLN
1.0000 mg | INTRAMUSCULAR | Status: DC | PRN
  Administered 2021-01-26 – 2021-01-27 (×8): 1 mg via INTRAVENOUS
  Filled 2021-01-26 (×9): qty 1

## 2021-01-26 NOTE — ED Notes (Signed)
Pt yelling in hallway for pain medication. MD aware.

## 2021-01-26 NOTE — ED Provider Notes (Signed)
[facility] Emergency Department Provider Note   ____________________________________________   I have reviewed the triage vital signs and the nursing notes.   HISTORY  Chief Complaint Sickle Cell Pain Crisis   History limited by: Not Limited   HPI Timothy Marks is a 30 y.o. male who presents to the emergency department today because of concern for sickle cell pain crises. The patient states that it started around 11 am today. He is having pain in his chest, back and extremities. The patient says he typically gets pain in his chest and back with his sickle cell crises. The patient has had some shortness of breath. He denies any recent fevers.    Records reviewed. Per medical record review patient has a history of sickle cell anemia, seen roughly 1 week ago for crises.   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, left shoulder ([name]) y-3   associated with Hb SS   Sickle cell anemia (HCC)    Vitamin D deficiency y-6    Patient Active Problem List   Diagnosis Date Noted   Lab test positive for detection of COVID-19 virus 01/23/2020   Medication management 01/16/2020   Underweight 06/14/2019   Generalized weakness 03/31/2019   Sickle-cell crisis ([name]) 03/24/2019   SIRS (systemic inflammatory response syndrome) ([name]) 02/10/2019   Abnormal liver function 11/04/2018   Sickle cell anemia with crisis ([name]) 05/22/2018   Narcotic dependence ([name]) 02/10/2018   Acute bilateral low back pain without sciatica    Other chronic pain    Anemia    Socially inappropriate behavior 12/02/2017   Thrombocythemia    Physical deconditioning    Agitation    Chronic pain syndrome    Recurrent cold sores    Fever of unknown origin    Drug-seeking behavior 08/30/2017   Pain    Sickle cell crisis ([name]) 07/16/2017   Adjustment disorder with mixed disturbance of emotions and  conduct    Bilateral pulmonary infiltrates on chest x-ray 11/21/2016   History of DVT (deep vein thrombosis) 11/17/2016   Suicidal ideations 10/11/2016   Reticulocytosis 10/09/2016   DVT (deep venous thrombosis) ([place]) 08/02/2016   Has difficulty accessing primary care provider for most visits 07/11/2015   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 ([name]) 06/29/2015   Hordeolum externum of left upper eyelid 05/31/2015   Major depressive disorder with current active episode 11/09/2014   Hb-SS disease with acute chest syndrome ([name]) 11/08/2014   Pneumonia of right upper lobe due to infectious organism 11/07/2014   Constipation 08/03/2014   Leukocytosis 06/30/2014   h/o Priapism 05/06/2014   Protein-calorie malnutrition, severe ([name]) 04/09/2014   Major depressive disorder, recurrent, severe without psychotic features ([name])    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, left shoulder ([name])    Vitamin D deficiency    Sickle cell anemia ([name]) 03/30/2014   Hyperbilirubinemia 03/30/2014   Hypokalemia 02/07/2014   Mandible fracture ([name]) 05/03/2013   Aggressive behavior 01/25/2013   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 Authorizing Provider  cholecalciferol (VITAMIN D) 1000 units tablet Take 2 tablets (2,000 Units total) by mouth daily. Patient not taking: Reported on 11/26/2020 12/13/17   Azzie Glatter, FNP  folic acid (FOLVITE) 1 MG tablet Take 1 tablet (1 mg total) by mouth daily. 05/07/20   Tresa Garter, MD  gabapentin (NEURONTIN) 300 MG capsule Take 1 capsule (300  mg total) by mouth 3 (three) times daily. 05/07/20   Tresa Garter, MD  hydroxyurea (HYDREA) 500 MG capsule Take 1 capsule (500 mg total) by mouth 2 (two) times daily. May take with food to minimize GI side effects. 05/07/20   Tresa Garter, MD  hydrOXYzine (ATARAX/VISTARIL) 10 MG tablet Take 1 tablet (10 mg total) by mouth 3 (three) times daily as needed for itching. Patient not taking: Reported on 11/26/2020 01/16/20   Tresa Garter, MD  Nutritional Supplements (ENSURE ACTIVE HIGH PROTEIN) LIQD Take 1 each by mouth 3 (three) times daily between meals. #90 cans of Ensure Patient not taking: Reported on 11/26/2020 11/13/18   Dorena Dew, FNP  oxyCODONE-acetaminophen (PERCOCET) 10-325 MG tablet Take 1 tablet by mouth every 6 (six) hours as needed for up to 15 days for pain. 01/15/21 01/30/21  Tresa Garter, MD  OXYCONTIN 20 MG 12 hr tablet Take 1 tablet (20 mg total) by mouth every 12 (twelve) hours. 01/15/21 02/14/21  Tresa Garter, MD  rivaroxaban (XARELTO) 20 MG TABS tablet Take 1 tablet (20 mg total) by mouth daily with supper. 06/24/20   Tresa Garter, MD    Allergies Buprenorphine hcl, Levaquin [levofloxacin], Meperidine, Morphine, Morphine and related, Tramadol, Vancomycin, and Zosyn [piperacillin sod-tazobactam so]  Family History  Problem Relation Age of Onset   Sickle cell trait Mother    Sickle cell trait Father     Social History Social History   Tobacco Use   Smoking status: Former    Types: Cigarettes   Smokeless tobacco: Never  Vaping Use   Vaping Use: Never used  Substance Use Topics   Alcohol use: No   Drug use: Not Currently    Types: Marijuana    Review of Systems Constitutional: No fever/chills Eyes: No visual changes. ENT: No sore throat. Cardiovascular: Positive for chest pain. Respiratory: Positive for shortness of breath. Gastrointestinal: No abdominal pain.  No nausea, no vomiting.  No diarrhea.   Genitourinary:  Negative for dysuria. Musculoskeletal: Positive for back pain and extremity pain. Skin: Negative for rash. Neurological: Negative for headaches, focal weakness or numbness.  ____________________________________________   PHYSICAL EXAM:  VITAL SIGNS: ED Triage Vitals [01/26/21 1755]  Enc Vitals Group     BP 130/87     Pulse Rate (!) 107     Resp 20     Temp 98.5 F (36.9 C)     Temp Source Oral     SpO2 94 %     Weight 134 lb (60.8 kg)     Height 6\' 2"  (1.88 m)     Head Circumference      Peak Flow      Pain Score 10     Pain Loc    Constitutional: Alert and oriented.  Eyes: Conjunctivae are normal.  ENT      Head: Normocephalic and atraumatic.      Nose: No congestion/rhinnorhea.      Mouth/Throat: Mucous membranes are moist.      Neck: No stridor. Hematological/Lymphatic/Immunilogical: No cervical lymphadenopathy. Cardiovascular: Tachycardia, regular rhythm.  No murmurs, rubs, or gallops.  Respiratory:  Normal respiratory effort without tachypnea nor retractions. Breath sounds are clear and equal bilaterally. No wheezes/rales/rhonchi. Gastrointestinal: Soft and non tender. No rebound. No guarding.  Genitourinary: Deferred Musculoskeletal: Normal range of motion in all extremities. No lower extremity edema. Neurologic:  Normal speech and language. No gross focal neurologic deficits are appreciated.  Skin:  Skin is warm, dry and intact. No rash noted. Psychiatric: Mood and affect are normal. Speech and behavior are normal. Patient exhibits appropriate insight and judgment.  ____________________________________________    LABS (pertinent positives/negatives)  Trop hs 41 CBC wbc 22.6, hgb 8.7, plt 356 BMP na 135, k 4.3, glu 124, cr 0.52  ____________________________________________   EKG  I, Nance Pear, attending physician, personally viewed and interpreted this EKG  EKG Time: 1758 Rate: 102 Rhythm: sinus tachycardia Axis: normal Intervals: qtc  419 QRS: narrow ST changes: no st elevation Impression: abnormal ekg   ____________________________________________    RADIOLOGY  CXR No active cardiopulmonary disease  ____________________________________________   PROCEDURES  Procedures  ____________________________________________   INITIAL IMPRESSION / ASSESSMENT AND PLAN / ED COURSE  Pertinent labs & imaging results that were available during my care of the patient were reviewed by me and considered in my medical decision making (see chart for details).   Patient presented to the emergency department today because of concerns for chest back and extremity pain.  Patient has a history of sickle cell disease.  Patient was afebrile here.  Not hypoxic.  The patient did have blood work checked which showed elevated troponin.  I do not see history of elevated troponin.  Because of this I do have concerns for possible cardiac injury.  Because of this will plan on transferring to [facility] for more specified level of care.  ____________________________________________   FINAL CLINICAL IMPRESSION(S) / ED DIAGNOSES  Final diagnoses:  Sickle cell disease with crisis ([place])  Elevated troponin     Note: This dictation was prepared with Dragon dictation. Any transcriptional errors that result from this process are unintentional     Nance Pear, MD 01/26/21 2316

## 2021-01-26 NOTE — ED Provider Notes (Signed)
Emergency Medicine Provider Triage Evaluation Note  Timothy Marks, a 30 y.o. male  was evaluated in triage.  Pt complains of chest pain and sickle cell crisis.  Patient was reportedly of his normal level of health and was at his girlfriend's house when he had onset of chest pain during intercourse.  He presents via EMS with complaints of generalized body ache, chest pain, and dry mouth.  He denies any syncope, nausea, vomiting, or dizziness.  Review of Systems  Positive: CP Negative: FCS  Physical Exam  There were no vitals taken for this visit. Gen:   Awake, no distress  NAD Resp:  Normal effort CTA MSK:   Moves extremities without difficulty  Other:  CVS: RRR  Medical Decision Making  Medically screening exam initiated at 5:54 PM.  Appropriate orders placed.  Timothy Marks was informed that the remainder of the evaluation will be completed by another provider, this initial triage assessment does not replace that evaluation, and the importance of remaining in the ED until their evaluation is complete.  Patient with ED evaluation of central chest pain and reported sickle cell crisis.   Melvenia Needles, PA-C 01/26/21 1756    [name], MD 01/27/21 [phone]

## 2021-01-26 NOTE — Progress Notes (Addendum)
Due to vein location. Patient and nurse on midline and increased risk of infiltration. Midline declined at this time. Fran Lowes, RN VAST

## 2021-01-26 NOTE — ED Notes (Signed)
Unsuccessful IV attempt.

## 2021-01-26 NOTE — ED Notes (Signed)
ED Provider at bedside. 

## 2021-01-26 NOTE — ED Triage Notes (Signed)
Pt to ED for sickle cell pain crisis, reports back and chest pain and pain all over. Per EMS has been having pain all day and pain worsened a few hours ago after having sexual intercourse.   Reports takes percocet and oxy at home

## 2021-01-26 NOTE — ED Notes (Signed)
Pt. Laying in Biomedical scientist, cooperative, chatting with sig other on phone. NAD.

## 2021-01-26 NOTE — ED Provider Notes (Signed)
[facility] Emergency Department Provider Note   ____________________________________________   I have reviewed the triage vital signs and the nursing notes.   HISTORY  Chief Complaint Sickle Cell Pain Crisis   History limited by: Not Limited   HPI Timothy Marks is a 30 y.o. male who presents to the emergency department today because of concern for sickle cell pain crises. The patient states that it started around 11 am today. He is having pain in his chest, back and extremities. The patient says he typically gets pain in his chest and back with his sickle cell crises. The patient has had some shortness of breath. He denies any recent fevers.    Records reviewed. Per medical record review patient has a history of sickle cell anemia, seen roughly 1 week ago for crises.   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   Sickle cell anemia (HCC)    Vitamin D deficiency y-6    Patient Active Problem List   Diagnosis Date Noted   Lab test positive for detection of COVID-19 virus 01/23/2020   Medication management 01/16/2020   Underweight 06/14/2019   Generalized weakness 03/31/2019   Sickle-cell crisis ([place]) 03/24/2019   SIRS (systemic inflammatory response syndrome) ([place]) 02/10/2019   Abnormal liver function 11/04/2018   Sickle cell anemia with crisis ([place]) 05/22/2018   Narcotic dependence ([place]) 02/10/2018   Acute bilateral low back pain without sciatica    Other chronic pain    Anemia    Socially inappropriate behavior 12/02/2017   Thrombocythemia    Physical deconditioning    Agitation    Chronic pain syndrome    Recurrent cold sores    Fever of unknown origin    Drug-seeking behavior 08/30/2017   Pain    Sickle cell crisis ([place]) 07/16/2017   Adjustment disorder with mixed disturbance of emotions and  conduct    Bilateral pulmonary infiltrates on chest x-ray 11/21/2016   History of DVT (deep vein thrombosis) 11/17/2016   Suicidal ideations 10/11/2016   Reticulocytosis 10/09/2016   DVT (deep venous thrombosis) ([place]) 08/02/2016   Has difficulty accessing primary care provider for most visits 07/11/2015   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 ([place]) 06/29/2015   Hordeolum externum of left upper eyelid 05/31/2015   Major depressive disorder with current active episode 11/09/2014   Hb-SS disease with acute chest syndrome ([place]) 11/08/2014   Pneumonia of right upper lobe due to infectious organism 11/07/2014   Constipation 08/03/2014   Leukocytosis 06/30/2014   h/o Priapism 05/06/2014   Protein-calorie malnutrition, severe ([place]) 04/09/2014   Major depressive disorder, recurrent, severe without psychotic features ([place])    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, left shoulder ([place])    Vitamin D deficiency    Sickle cell anemia ([place]) 03/30/2014   Hyperbilirubinemia 03/30/2014   Hypokalemia 02/07/2014   Mandible fracture ([place]) 05/03/2013   Aggressive behavior 01/25/2013   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 Authorizing Provider  cholecalciferol (VITAMIN D) 1000 units tablet Take 2 tablets (2,000 Units total) by mouth daily. Patient not taking: Reported on 11/26/2020 12/13/17   Azzie Glatter, FNP  folic acid (FOLVITE) 1 MG tablet Take 1 tablet (1 mg total) by mouth daily. 05/07/20   Tresa Garter, MD  gabapentin (NEURONTIN) 300 MG capsule Take 1 capsule (300  mg total) by mouth 3 (three) times daily. 05/07/20   Tresa Garter, MD  hydroxyurea (HYDREA) 500 MG capsule Take 1 capsule (500 mg total) by mouth 2 (two) times daily. May take with food to minimize GI side effects. 05/07/20   Tresa Garter, MD  hydrOXYzine (ATARAX/VISTARIL) 10 MG tablet Take 1 tablet (10 mg total) by mouth 3 (three) times daily as needed for itching. Patient not taking: Reported on 11/26/2020 01/16/20   Tresa Garter, MD  Nutritional Supplements (ENSURE ACTIVE HIGH PROTEIN) LIQD Take 1 each by mouth 3 (three) times daily between meals. #90 cans of Ensure Patient not taking: Reported on 11/26/2020 11/13/18   Dorena Dew, FNP  oxyCODONE-acetaminophen (PERCOCET) 10-325 MG tablet Take 1 tablet by mouth every 6 (six) hours as needed for up to 15 days for pain. 01/15/21 01/30/21  Tresa Garter, MD  OXYCONTIN 20 MG 12 hr tablet Take 1 tablet (20 mg total) by mouth every 12 (twelve) hours. 01/15/21 02/14/21  Tresa Garter, MD  rivaroxaban (XARELTO) 20 MG TABS tablet Take 1 tablet (20 mg total) by mouth daily with supper. 06/24/20   Tresa Garter, MD    Allergies Buprenorphine hcl, Levaquin [levofloxacin], Meperidine, Morphine, Morphine and related, Tramadol, Vancomycin, and Zosyn [piperacillin sod-tazobactam so]  Family History  Problem Relation Age of Onset   Sickle cell trait Mother    Sickle cell trait Father     Social History Social History   Tobacco Use   Smoking status: Former    Types: Cigarettes   Smokeless tobacco: Never  Vaping Use   Vaping Use: Never used  Substance Use Topics   Alcohol use: No   Drug use: Not Currently    Types: Marijuana    Review of Systems Constitutional: No fever/chills Eyes: No visual changes. ENT: No sore throat. Cardiovascular: Positive for chest pain. Respiratory: Positive for shortness of breath. Gastrointestinal: No abdominal pain.  No nausea, no vomiting.  No diarrhea.   Genitourinary:  Negative for dysuria. Musculoskeletal: Positive for back pain and extremity pain. Skin: Negative for rash. Neurological: Negative for headaches, focal weakness or numbness.  ____________________________________________   PHYSICAL EXAM:  VITAL SIGNS: ED Triage Vitals [01/26/21 1755]  Enc Vitals Group     BP 130/87     Pulse Rate (!) 107     Resp 20     Temp 98.5 F (36.9 C)     Temp Source Oral     SpO2 94 %     Weight 134 lb (60.8 kg)     Height 6\' 2"  (1.88 m)     Head Circumference      Peak Flow      Pain Score 10     Pain Loc    Constitutional: Alert and oriented.  Eyes: Conjunctivae are normal.  ENT      Head: Normocephalic and atraumatic.      Nose: No congestion/rhinnorhea.      Mouth/Throat: Mucous membranes are moist.      Neck: No stridor. Hematological/Lymphatic/Immunilogical: No cervical lymphadenopathy. Cardiovascular: Tachycardia, regular rhythm.  No murmurs, rubs, or gallops.  Respiratory:  Normal respiratory effort without tachypnea nor retractions. Breath sounds are clear and equal bilaterally. No wheezes/rales/rhonchi. Gastrointestinal: Soft and non tender. No rebound. No guarding.  Genitourinary: Deferred Musculoskeletal: Normal range of motion in all extremities. No lower extremity edema. Neurologic:  Normal speech and language. No gross focal neurologic deficits are appreciated.  Skin:  Skin is warm, dry and intact. No rash noted. Psychiatric: Mood and affect are normal. Speech and behavior are normal. Patient exhibits appropriate insight and judgment.  ____________________________________________    LABS (pertinent positives/negatives)  Trop hs 41 CBC wbc 22.6, hgb 8.7, plt 356 BMP na 135, k 4.3, glu 124, cr 0.52  ____________________________________________   EKG  I, Nance Pear, attending physician, personally viewed and interpreted this EKG  EKG Time: 1758 Rate: 102 Rhythm: sinus tachycardia Axis: normal Intervals: qtc  419 QRS: narrow ST changes: no st elevation Impression: abnormal ekg   ____________________________________________    RADIOLOGY  CXR No active cardiopulmonary disease  CT angio No PE. No concerning findings ____________________________________________   PROCEDURES  Procedures  ____________________________________________   INITIAL IMPRESSION / ASSESSMENT AND PLAN / ED COURSE  Pertinent labs & imaging results that were available during my care of the patient were reviewed by me and considered in my medical decision making (see chart for details).   Patient presented to the emergency department today because of concerns for chest and back pain as well as extremity pain.  Patient has a history of sickle cell disease.  He is concerned that he is having a sickle cell pain crisis.  No fever or hypoxia here.  Blood work however was concerning for elevated troponin.  EKG does show tachycardia with some j point elevation.  Patient has not had elevated troponins in the past.  Because of this I do think patient would benefit from admission for further cardiac evaluation.  I did obtain a CT angio PE study here in the emergency department which did not show pulmonary embolism.  Patient was accepted in transfer to [facility]  ____________________________________________   FINAL CLINICAL IMPRESSION(S) / ED DIAGNOSES  Final diagnoses:  Sickle cell disease with crisis ([place])  Elevated troponin     Note: This dictation was prepared with Dragon dictation. Any transcriptional errors that result from this process are unintentional     Nance Pear, MD 01/26/21 [id number]

## 2021-01-27 ENCOUNTER — Encounter (HOSPITAL_COMMUNITY): Payer: Self-pay | Admitting: Internal Medicine

## 2021-01-27 ENCOUNTER — Inpatient Hospital Stay (HOSPITAL_COMMUNITY)
Admission: EM | Admit: 2021-01-27 | Discharge: 2021-02-01 | DRG: 812 | Disposition: A | Discharge status: Home / Self Care | Payer: Medicare Other | Source: Other Acute Inpatient Hospital | Attending: Internal Medicine | Admitting: Internal Medicine

## 2021-01-27 ENCOUNTER — Other Ambulatory Visit: Payer: Self-pay

## 2021-01-27 DIAGNOSIS — F1120 Opioid dependence, uncomplicated: Secondary | ICD-10-CM | POA: Diagnosis present

## 2021-01-27 DIAGNOSIS — Z87891 Personal history of nicotine dependence: Secondary | ICD-10-CM | POA: Diagnosis not present

## 2021-01-27 DIAGNOSIS — D5700 Hb-SS disease with crisis, unspecified: Principal | ICD-10-CM | POA: Diagnosis present

## 2021-01-27 DIAGNOSIS — Z888 Allergy status to other drugs, medicaments and biological substances status: Secondary | ICD-10-CM

## 2021-01-27 DIAGNOSIS — D72829 Elevated white blood cell count, unspecified: Secondary | ICD-10-CM | POA: Diagnosis present

## 2021-01-27 DIAGNOSIS — Z7901 Long term (current) use of anticoagulants: Secondary | ICD-10-CM | POA: Diagnosis not present

## 2021-01-27 DIAGNOSIS — Z885 Allergy status to narcotic agent status: Secondary | ICD-10-CM | POA: Diagnosis not present

## 2021-01-27 DIAGNOSIS — Z20822 Contact with and (suspected) exposure to covid-19: Secondary | ICD-10-CM | POA: Diagnosis present

## 2021-01-27 DIAGNOSIS — Z832 Family history of diseases of the blood and blood-forming organs and certain disorders involving the immune mechanism: Secondary | ICD-10-CM

## 2021-01-27 DIAGNOSIS — Z86718 Personal history of other venous thrombosis and embolism: Secondary | ICD-10-CM

## 2021-01-27 DIAGNOSIS — E876 Hypokalemia: Secondary | ICD-10-CM | POA: Diagnosis not present

## 2021-01-27 DIAGNOSIS — R000 Tachycardia, unspecified: Secondary | ICD-10-CM | POA: Diagnosis not present

## 2021-01-27 DIAGNOSIS — F1290 Cannabis use, unspecified, uncomplicated: Secondary | ICD-10-CM | POA: Diagnosis present

## 2021-01-27 DIAGNOSIS — E440 Moderate protein-calorie malnutrition: Secondary | ICD-10-CM | POA: Diagnosis present

## 2021-01-27 DIAGNOSIS — R509 Fever, unspecified: Secondary | ICD-10-CM

## 2021-01-27 DIAGNOSIS — Z681 Body mass index (BMI) 19 or less, adult: Secondary | ICD-10-CM | POA: Diagnosis not present

## 2021-01-27 DIAGNOSIS — G894 Chronic pain syndrome: Secondary | ICD-10-CM | POA: Diagnosis present

## 2021-01-27 DIAGNOSIS — Z79899 Other long term (current) drug therapy: Secondary | ICD-10-CM | POA: Diagnosis not present

## 2021-01-27 DIAGNOSIS — D638 Anemia in other chronic diseases classified elsewhere: Secondary | ICD-10-CM | POA: Diagnosis present

## 2021-01-27 LAB — CBC
HCT: 23.5 % — ABNORMAL LOW (ref 39.0–52.0)
Hemoglobin: 8.2 g/dL — ABNORMAL LOW (ref 13.0–17.0)
MCH: 30 pg (ref 26.0–34.0)
MCHC: 34.9 g/dL (ref 30.0–36.0)
MCV: 86.1 fL (ref 80.0–100.0)
Platelets: 272 10*3/uL (ref 150–400)
RBC: 2.73 MIL/uL — ABNORMAL LOW (ref 4.22–5.81)
RDW: 20.8 % — ABNORMAL HIGH (ref 11.5–15.5)
WBC: 17.8 10*3/uL — ABNORMAL HIGH (ref 4.0–10.5)
nRBC: 10.4 % — ABNORMAL HIGH (ref 0.0–0.2)

## 2021-01-27 LAB — BASIC METABOLIC PANEL
Anion gap: 10 (ref 5–15)
BUN: 10 mg/dL (ref 6–20)
CO2: 22 mmol/L (ref 22–32)
Calcium: 8.6 mg/dL — ABNORMAL LOW (ref 8.9–10.3)
Chloride: 98 mmol/L (ref 98–111)
Creatinine, Ser: 0.6 mg/dL — ABNORMAL LOW (ref 0.61–1.24)
GFR, Estimated: >60 mL/min (ref >60)
Glucose, Bld: 115 mg/dL — ABNORMAL HIGH (ref 70–99)
Potassium: 3.9 mmol/L (ref 3.5–5.1)
Sodium: 130 mmol/L — ABNORMAL LOW (ref 135–145)

## 2021-01-27 LAB — TROPONIN I (HIGH SENSITIVITY)
Troponin I (High Sensitivity): 22 ng/L — ABNORMAL HIGH (ref <18)
Troponin I (High Sensitivity): 22 ng/L — ABNORMAL HIGH (ref <18)

## 2021-01-27 MED ORDER — RIVAROXABAN 20 MG PO TABS
20.0000 mg | ORAL_TABLET | Freq: Every day | ORAL | Status: DC
  Administered 2021-01-28 – 2021-01-31 (×4): 20 mg via ORAL
  Filled 2021-01-27 (×4): qty 1

## 2021-01-27 MED ORDER — POLYETHYLENE GLYCOL 3350 17 G PO PACK: 17.0000 g | PACK | Freq: Every day | ORAL | Status: DC | PRN

## 2021-01-27 MED ORDER — SENNOSIDES-DOCUSATE SODIUM 8.6-50 MG PO TABS
1.0000 | ORAL_TABLET | Freq: Two times a day (BID) | ORAL | Status: DC
  Administered 2021-01-28 – 2021-02-01 (×7): 1 via ORAL
  Filled 2021-01-27 (×9): qty 1

## 2021-01-27 MED ORDER — ONDANSETRON HCL 4 MG/2ML IJ SOLN: 4.0000 mg | Freq: Four times a day (QID) | INTRAMUSCULAR | Status: DC | PRN

## 2021-01-27 MED ORDER — KCL IN DEXTROSE-NACL 20-5-0.45 MEQ/L-%-% IV SOLN
Freq: Once | INTRAVENOUS | Status: AC
  Filled 2021-01-27: qty 1000

## 2021-01-27 MED ORDER — LORAZEPAM 2 MG/ML IJ SOLN
1.0000 mg | Freq: Once | INTRAMUSCULAR | Status: AC
  Administered 2021-01-27: 09:00:00 1 mg via INTRAVENOUS
  Filled 2021-01-27: qty 1

## 2021-01-27 MED ORDER — DIPHENHYDRAMINE HCL 25 MG PO CAPS
25.0000 mg | ORAL_CAPSULE | ORAL | Status: DC | PRN
  Administered 2021-01-28: 25 mg via ORAL
  Filled 2021-01-27: qty 1

## 2021-01-27 MED ORDER — HYDROMORPHONE 1 MG/ML IV SOLN
INTRAVENOUS | Status: DC
  Administered 2021-01-27: 1 mL via INTRAVENOUS
  Filled 2021-01-27: qty 30

## 2021-01-27 MED ORDER — FOLIC ACID 1 MG PO TABS
1.0000 mg | ORAL_TABLET | Freq: Every day | ORAL | Status: DC
  Administered 2021-01-28 – 2021-02-01 (×5): 1 mg via ORAL
  Filled 2021-01-27 (×5): qty 1

## 2021-01-27 MED ORDER — HYDROMORPHONE HCL 1 MG/ML IJ SOLN
INTRAMUSCULAR | Status: AC
  Filled 2021-01-27: qty 1

## 2021-01-27 MED ORDER — GABAPENTIN 300 MG PO CAPS
300.0000 mg | ORAL_CAPSULE | Freq: Three times a day (TID) | ORAL | Status: DC
  Administered 2021-01-27 – 2021-02-01 (×14): 300 mg via ORAL
  Filled 2021-01-27 (×14): qty 1

## 2021-01-27 MED ORDER — HYDROMORPHONE HCL 1 MG/ML IJ SOLN
1.0000 mg | Freq: Once | INTRAMUSCULAR | Status: AC
  Administered 2021-01-27: 10:00:00 1 mg via INTRAVENOUS

## 2021-01-27 MED ORDER — ONDANSETRON HCL 4 MG/2ML IJ SOLN: 4.0000 mg | INTRAMUSCULAR | Status: DC | PRN

## 2021-01-27 MED ORDER — SODIUM CHLORIDE 0.45 % IV SOLN: INTRAVENOUS | Status: DC

## 2021-01-27 MED ORDER — HYDROMORPHONE HCL 1 MG/ML IJ SOLN
1.0000 mg | INTRAMUSCULAR | Status: DC | PRN
  Administered 2021-01-27: 20:00:00 1 mg via INTRAVENOUS
  Filled 2021-01-27: qty 1

## 2021-01-27 MED ORDER — ONDANSETRON HCL 4 MG PO TABS: 4.0000 mg | ORAL_TABLET | ORAL | Status: DC | PRN

## 2021-01-27 MED ORDER — HYDROXYUREA 500 MG PO CAPS
500.0000 mg | ORAL_CAPSULE | Freq: Two times a day (BID) | ORAL | Status: DC
  Administered 2021-01-28 – 2021-01-30 (×6): 500 mg via ORAL
  Filled 2021-01-27 (×7): qty 1

## 2021-01-27 MED ORDER — NALOXONE HCL 0.4 MG/ML IJ SOLN: 0.4000 mg | INTRAMUSCULAR | Status: DC | PRN

## 2021-01-27 MED ORDER — SODIUM CHLORIDE 0.9 % IV SOLN
25.0000 mg | INTRAVENOUS | Status: DC | PRN
  Filled 2021-01-27 (×2): qty 0.5

## 2021-01-27 MED ORDER — HYDROMORPHONE HCL 1 MG/ML IJ SOLN
1.0000 mg | INTRAMUSCULAR | Status: AC
  Administered 2021-01-27: 18:00:00 1 mg via INTRAVENOUS
  Filled 2021-01-27: qty 1

## 2021-01-27 MED ORDER — SODIUM CHLORIDE 0.9% FLUSH: 9.0000 mL | INTRAVENOUS | Status: DC | PRN

## 2021-01-27 NOTE — ED Notes (Signed)
Pt requesting pain medication and talking about halloween sticks and dogs running around. Pt explained that he has been getting regular Q2 pain medication and that the next dose is not due.

## 2021-01-27 NOTE — ED Notes (Signed)
Pt making loud noises while in hallway bed. Writer witnesses pt with no clothing on and pulling his penis. Pt reminded that this behavior is not allowed or will be tolerated. Pt instructed to put his underwear back on and only to void should he have his genitals out or touching. Pt requesting pain medication and sts, "Im sorry, Im sorry, EW-WEE."

## 2021-01-27 NOTE — ED Notes (Signed)
[name] from [name] called with bed assignment  [name]  [address]  report [id number]

## 2021-01-27 NOTE — Progress Notes (Signed)
Patient noncompliant with vitals. Refusing intermittently to get vital signs due to pain or feeling bothered "by the cords."   Patient verbally aggressive and agitated with RN. Constantly telling RN, "This is going to make me mad." Patient continuously demanding and cursing at RN for "you [being to] slow."  Patient increasingly agitated by the cords for PCA monitoring  (specifically his tele monitoring, Spo2, BP cuff, and Co2 monitoring). Pt acknowledges having a PCA in the past and knowing he needs increased monitoring.

## 2021-01-27 NOTE — ED Notes (Signed)
Awake, alert.  C/O back pain.  Medicated as per MAR.  Continue to monitor

## 2021-01-27 NOTE — ED Provider Notes (Signed)
----------------------------------------- °  2:01 PM on 01/27/2021 ----------------------------------------- Patient continues to intermittently yell out in the hallway saying that he is in pain.  Stating that he has not gotten any relief.  States that nobody is helping him.  Continuously asked to see myself as well as the nurse.  I have seen the patient multiple times throughout my shift.  The nurse has continuously been seeing the patient throughout her shift so far.  Patient is asking for more more pain medication.  I did give the patient a one-time dose of IV Ativan to help him relax which seem to work for approximately 2 hours until the patient began once again becoming agitated yelling saying he is in pain.  I gave him 1 additional dose of Dilaudid but I spoke to the patient told him that we need to stick with the order of 1 mg every 2 hours as needed for discomfort, he is agreeable to this plan.  We continue to await a bed at [facility].   Harvest Dark, MD 01/27/21 [phone]

## 2021-01-27 NOTE — Progress Notes (Signed)
Admitting paged to make aware pt arrived to [facility], patient requesting pain medicine for 10/10 pain.

## 2021-01-27 NOTE — ED Notes (Signed)
Pt yelling out and writer to address pt. On arrival to pt, pt masturbating and informed that that behavior will not be tolerated nor will it continue. If so, he will be asked to leave. Pt apologizing and sts, "Okay, okay, Im just hurting. I need more pain medication." Pt informed that he is on a Q2 hour pain medication and the next dose is due at 0130.

## 2021-01-27 NOTE — H&P (Signed)
History and Physical    Timothy Marks SAY:[id number] DOB: [birth year] DOA: 01/27/2021  PCP: Tresa Garter, MD   Patient coming from: Home via [facility]  Chief Complaint:   Sickle cell pain crisis.  HPI: Timothy Marks is a 30 y.o. male with medical history significant for disease, chronic pain syndrome, opiate dependence and tolerance, and anemia of chronic disease presents with complaints of low back pain, chest and leg pain. that is consistent with his typical pain crisis.  Pain present for the past 3 days. No relief at home with his home medications. Pt has been very demanding in the Er per the note with frequent requests for increased pain medications. He denies any changes in activity, diet, exposures to explain the pain crisis. He rates his pain as 9/10 characterized as intermittent and aching. He denies any headache, chest pain, shortness of breath, urinary symptoms, nausea, vomiting, or diarrhea.  No sick contacts, recent travel, or known exposure to COVID-19. He was transferred to [facility] from [facility] with elevated troponin. Pt was held in Er for past 24 hours awaiting a bed but they did not do any repeat lab testing for troponin or anything in past 23 hours.   Review of Systems:  General: Denies  fever, chills, weight loss, night sweats.  Denies dizziness.  Denies change in appetite HENT: Denies head trauma, headache, denies change in hearing, tinnitus.  Denies nasal congestion or bleeding.  Denies sore throat  Denies difficulty swallowing Eyes: Denies blurry vision, pain in eye, drainage.  Denies discoloration of eyes. Neck: Denies pain.  Denies swelling.  Denies pain with movement. Cardiovascular: reports sharp intermittent chest pain. Denies palpitations.  Denies edema.  Denies orthopnea Respiratory: Denies shortness of breath, cough.  Denies wheezing.  Denies sputum production Gastrointestinal: Reports abdominal pain, swelling.  Denies nausea, vomiting, diarrhea.  Denies melena.   Denies hematemesis. Musculoskeletal: Reports limitation of movement of right arm due to pain.  Denies deformity or swelling.  Genitourinary: Denies pelvic pain.  Denies urinary frequency or hesitancy.  Denies dysuria.  Skin: Denies rash.  Denies petechiae, purpura, ecchymosis. Neurological:Denies syncope.  Denies seizure activity. Denies paresthesia.  Denies slurred speech, drooping face.  Denies visual change. Psychiatric: Denies depression, anxiety. Denies hallucinations.  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 His smoking use included cigarettes. He has never used smokeless tobacco. He reports that he does not currently use drugs after having used the following drugs: Marijuana. He reports that he does not drink alcohol.  Allergies  Allergen Reactions   Buprenorphine Hcl Hives   Levaquin [Levofloxacin] Itching   Meperidine Rash   Morphine Hives   Morphine And Related Hives   Tramadol Hives   Vancomycin Itching   Zosyn [Piperacillin Sod-Tazobactam So] Itching, Rash and Other (See Comments)    Has taken rocephin in the past    Family History  Problem Relation Age of Onset   Sickle cell trait Mother    Sickle cell trait Father      Prior to Admission medications   Medication Sig Start Date End Date Taking? Authorizing Provider  cholecalciferol (VITAMIN D) 1000 units  tablet Take 2 tablets (2,000 Units total) by mouth daily. Patient not taking: Reported on 11/26/2020 12/13/17   Azzie Glatter, FNP  folic acid (FOLVITE) 1 MG tablet Take 1 tablet (1 mg total) by mouth daily. 05/07/20   Tresa Garter, MD  gabapentin (NEURONTIN) 300 MG capsule  Take 1 capsule (300 mg total) by mouth 3 (three) times daily. 05/07/20   Tresa Garter, MD  hydroxyurea (HYDREA) 500 MG capsule Take 1 capsule (500 mg total) by mouth 2 (two) times daily. May take with food to minimize GI side effects. 05/07/20   Tresa Garter, MD  hydrOXYzine (ATARAX/VISTARIL) 10 MG tablet Take 1 tablet (10 mg total) by mouth 3 (three) times daily as needed for itching. Patient not taking: Reported on 11/26/2020 01/16/20   Tresa Garter, MD  Nutritional Supplements (ENSURE ACTIVE HIGH PROTEIN) LIQD Take 1 each by mouth 3 (three) times daily between meals. #90 cans of Ensure Patient not taking: Reported on 11/26/2020 11/13/18   Dorena Dew, FNP  oxyCODONE-acetaminophen (PERCOCET) 10-325 MG tablet Take 1 tablet by mouth every 6 (six) hours as needed for up to 15 days for pain. 01/15/21 01/30/21  Tresa Garter, MD  OXYCONTIN 20 MG 12 hr tablet Take 1 tablet (20 mg total) by mouth every 12 (twelve) hours. 01/15/21 02/14/21  Tresa Garter, MD  rivaroxaban (XARELTO) 20 MG TABS tablet Take 1 tablet (20 mg total) by mouth daily with supper. 06/24/20   Tresa Garter, MD    Physical Exam: Vitals:   01/27/21 1903  BP: 133/80  Pulse: (!) 114  Resp: 20  Temp: 98.6 F (37 C)  TempSrc: Oral  SpO2: 97%    Constitutional: NAD, calm, comfortable Vitals:   01/27/21 1903  BP: 133/80  Pulse: (!) 114  Resp: 20  Temp: 98.6 F (37 C)  TempSrc: Oral  SpO2: 97%   General: WDWN, Alert and oriented x3.  Eyes: EOMI, PERRL, conjunctivae normal.  Sclera nonicteric HENT:  [place]/AT, external ears normal.  Nares patent without epistasis.  Mucous membranes are moist.   Neck: Soft, normal range of motion, supple, no masses, no thyromegaly.  Trachea midline Respiratory: clear to auscultation bilaterally, no wheezing, no crackles. Normal respiratory effort. No accessory muscle use.  Cardiovascular: Regular rate and rhythm, no murmurs / rubs / gallops. No  extremity edema. 2+ pedal pulses.  Abdomen: Soft, no tenderness, nondistended, no rebound or guarding.  No masses palpated. No hepatosplenomegaly. Bowel sounds normoactive Musculoskeletal: FROM passivley. no  cyanosis. No joint deformity upper and lower extremities. no contractures. Normal muscle tone.  Skin: Warm, dry, intact no rashes, lesions, ulcers. No induration Neurologic: CN 2-12 grossly intact.  Normal speech.  Sensation intact, Strength 5/5 in all extremities.   Psychiatric: Normal judgment and insight.  Normal mood.    Labs on Admission: I have personally reviewed following labs and imaging studies  CBC: Recent Labs  Lab 01/26/21 1940  WBC 22.6*  HGB 8.7*  HCT 25.7*  MCV 86.5  PLT 627    Basic Metabolic Panel: Recent Labs  Lab 01/26/21 1940  NA 135  K 4.3  CL 101  CO2 26  GLUCOSE 124*  BUN 9  CREATININE 0.52*  CALCIUM 9.3    GFR: Estimated Creatinine Clearance: 114 mL/min (A) (by C-G formula based on SCr of 0.52 mg/dL (L)).  Liver Function Tests: No results for input(s): AST, ALT, ALKPHOS, BILITOT, PROT, ALBUMIN in the last 168 hours.  Urine analysis:  Component Value Date/Time   COLORURINE YELLOW 11/24/2020 2236   APPEARANCEUR CLEAR 11/24/2020 2236   LABSPEC 1.006 11/24/2020 2236   PHURINE 7.0 11/24/2020 2236   GLUCOSEU NEGATIVE 11/24/2020 2236   HGBUR MODERATE (A) 11/24/2020 2236   BILIRUBINUR NEGATIVE 11/24/2020 2236   BILIRUBINUR small 05/12/2020 1227   KETONESUR NEGATIVE 11/24/2020 2236   PROTEINUR NEGATIVE 11/24/2020 2236   UROBILINOGEN 4.0 (A) 05/12/2020 1227   UROBILINOGEN 2.0 (H) 05/01/2017 0955   NITRITE NEGATIVE 11/24/2020 2236   LEUKOCYTESUR TRACE (A) 11/24/2020 2236    Radiological Exams on Admission: DG Chest 2 View  Result Date: 01/26/2021 CLINICAL DATA:  Chest pain.  Sickle cell. EXAM: CHEST - 2 VIEW COMPARISON:  Chest radiograph dated 11/24/2020 FINDINGS: No focal consolidation, pleural effusion, pneumothorax. Mild  cardiomegaly. No acute osseous pathology. IMPRESSION: No active cardiopulmonary disease. Electronically Signed   By: Anner Crete M.D.   On: 01/26/2021 19:13   CT Angio Chest PE W and/or Wo Contrast  Result Date: 01/26/2021 CLINICAL DATA:  Chest pain.  Sickle cell pain. EXAM: CT ANGIOGRAPHY CHEST WITH CONTRAST TECHNIQUE: Multidetector CT imaging of the chest was performed using the standard protocol during bolus administration of intravenous contrast. Multiplanar CT image reconstructions and MIPs were obtained to evaluate the vascular anatomy. CONTRAST:  52mL OMNIPAQUE IOHEXOL 350 MG/ML SOLN COMPARISON:  Chest radiograph dated 01/28/2021 and CT dated 10/12/2016. FINDINGS: Evaluation of this exam is limited due to respiratory motion artifact. Cardiovascular: There is no cardiomegaly or pericardial effusion. The thoracic aorta is unremarkable. The origins of the great vessels of the aortic arch appear maintained. No pulmonary artery embolus identified. Mediastinum/Nodes: No hilar or mediastinal adenopathy. Small amount of fluid noted within the esophagus. No mediastinal fluid collection. Lungs/Pleura: No focal consolidation, pleural effusion, or pneumothorax. Bibasilar subpleural linear atelectasis/scarring. The central airways are patent. Upper Abdomen: Right renal upper pole cortical scarring. Musculoskeletal: Sclerotic changes of the osseous structures and central depression of the thoracic endplate, stigmata of sickle cell. No acute osseous pathology. Review of the MIP images confirms the above findings. IMPRESSION: No acute intrathoracic pathology. No CT evidence of pulmonary embolism. Electronically Signed   By: Anner Crete M.D.   On: 01/26/2021 22:03    EKG: Independently reviewed.  EKG shows sinus tachycardia with no acute ST elevation or depression.  QTc 419  Assessment/Plan Principal Problem:   Sickle cell pain crisis  Timothy Marks is admitted to medical telemetry floor. He is started on  Dilaudid PCA pump for pain control.  He is opiate tolerant and uses OxyContin and Percocet at home daily.  He reports he has an allergy to tramadol and Toradol. Check BMP. Check troponin level x2 Fluid hydration with half-normal saline at 100 mils per hour Have sickle cell team evaluate patient tomorrow  Active Problems:   Leukocytosis Check CBC No sign of infection on chest x-ray.  No fever or other signs or symptoms of infection    Marijuana smoker Chronic use    DVT prophylaxis: Patient is on Xarelto for anticoagulation as he has a history of DVTs.  Xarelto continued Code Status:   Full code Family Communication:  Diagnosis and plan discussed with patient.  Pain control plan discussed with patient and he verbalizes agreement Disposition Plan:   Patient is from:  Home  Anticipated DC to:  Home  Anticipated DC date:  Anticipate 2 midnight or more stay  Anticipated DC barriers:    Admission status:  Inpatient   Eben Burow MD [facility]  How to contact the [facility] Attending or Consulting provider [place] or covering provider during after hours [place], for this patient?   Check the care team in [place] and look for a) attending/consulting [facility] provider listed and b) the [facility] team listed Log into [email or web address] and use [place] universal password to access. If you do not have the password, please contact the hospital operator. Locate the [facility] provider you are looking for under [facility] and page to a number that you can be directly reached. If you still have difficulty reaching the provider, please page the [facility] (Director on Call) for the Hospitalists listed on amion for assistance.  01/27/2021, 8:05 PM

## 2021-01-27 NOTE — ED Notes (Signed)
Called [facility] spoke to [facility]  still on waiting list for bed

## 2021-01-27 NOTE — ED Notes (Signed)
Pt provided with water, per request.

## 2021-01-27 NOTE — Progress Notes (Signed)
°   01/27/21 1903  Assess: MEWS Score  Temp 98.6 F (37 C)  BP 133/80  Pulse Rate (!) 114  Resp 20  SpO2 97 %  O2 Device Room Air  Assess: MEWS Score  MEWS Temp 0  MEWS Systolic 0  MEWS Pulse 2  MEWS RR 0  MEWS LOC 0  MEWS Score 2  MEWS Score Color Yellow  Assess: if the MEWS score is Yellow or Red  Were vital signs taken at a resting state? Yes  Focused Assessment No change from prior assessment  Early Detection of Sepsis Score *See Row Information* Low  MEWS guidelines implemented *See Row Information* Yes  Treat  MEWS Interventions Escalated (See documentation below) (contacted MD for prn pain medicine)  Pain Scale 0-10  Pain Score 10  Pain Type Chronic pain  Pain Location Generalized  Take Vital Signs  Increase Vital Sign Frequency  Yellow: Q 2hr X 2 then Q 4hr X 2, if remains yellow, continue Q 4hrs  Escalate  MEWS: Escalate Yellow: discuss with charge nurse/RN and consider discussing with provider and RRT   MEWS evaluated after handoff. Initial yellow by prior RN. Patient refused 2100 Vitals due to pain. HR elevated due to pain. PCA initiated. Multiple conversations with MD about pain control. Spoke to charge RN about MEWS score.

## 2021-01-27 NOTE — ED Notes (Signed)
Per Maudie Mercury @ [facility] no bed assigned to patient as of this time.

## 2021-01-27 NOTE — ED Notes (Signed)
Pt given pain medication, he is asking for more pain medication.  Pt saying that his legs are attached to the bed and the heart beat in his leg is not the same as the one in the be. Will continue to monitor.

## 2021-01-27 NOTE — ED Notes (Signed)
Called and spoke to [facility] patient waiting for bed assignment on list

## 2021-01-28 ENCOUNTER — Telehealth: Payer: Self-pay

## 2021-01-28 ENCOUNTER — Encounter (HOSPITAL_COMMUNITY): Payer: Self-pay | Admitting: Family Medicine

## 2021-01-28 ENCOUNTER — Inpatient Hospital Stay (HOSPITAL_COMMUNITY): Payer: Medicare Other

## 2021-01-28 DIAGNOSIS — R509 Fever, unspecified: Secondary | ICD-10-CM

## 2021-01-28 LAB — COMPREHENSIVE METABOLIC PANEL
ALT: 17 U/L (ref 0–44)
AST: 54 U/L — ABNORMAL HIGH (ref 15–41)
Albumin: 3.5 g/dL (ref 3.5–5.0)
Alkaline Phosphatase: 213 U/L — ABNORMAL HIGH (ref 38–126)
Anion gap: 9 (ref 5–15)
BUN: 10 mg/dL (ref 6–20)
CO2: 24 mmol/L (ref 22–32)
Calcium: 8.3 mg/dL — ABNORMAL LOW (ref 8.9–10.3)
Chloride: 98 mmol/L (ref 98–111)
Creatinine, Ser: 0.69 mg/dL (ref 0.61–1.24)
GFR, Estimated: >60 mL/min (ref >60)
Glucose, Bld: 129 mg/dL — ABNORMAL HIGH (ref 70–99)
Potassium: 3.3 mmol/L — ABNORMAL LOW (ref 3.5–5.1)
Sodium: 131 mmol/L — ABNORMAL LOW (ref 135–145)
Total Bilirubin: 4.3 mg/dL — ABNORMAL HIGH (ref 0.3–1.2)
Total Protein: 7 g/dL (ref 6.5–8.1)

## 2021-01-28 LAB — URINALYSIS, COMPLETE (UACMP) WITH MICROSCOPIC
Bacteria, UA: NONE SEEN
Bilirubin Urine: NEGATIVE
Glucose, UA: NEGATIVE mg/dL
Ketones, ur: 15 mg/dL — AB
Nitrite: NEGATIVE
Protein, ur: NEGATIVE mg/dL
Specific Gravity, Urine: <1.005 — ABNORMAL LOW (ref 1.005–1.030)
pH: 5.5 (ref 5.0–8.0)

## 2021-01-28 LAB — CBC
HCT: 22 % — ABNORMAL LOW (ref 39.0–52.0)
Hemoglobin: 7.8 g/dL — ABNORMAL LOW (ref 13.0–17.0)
MCH: 30.1 pg (ref 26.0–34.0)
MCHC: 35.5 g/dL (ref 30.0–36.0)
MCV: 84.9 fL (ref 80.0–100.0)
Platelets: 236 10*3/uL (ref 150–400)
RBC: 2.59 MIL/uL — ABNORMAL LOW (ref 4.22–5.81)
RDW: 19.3 % — ABNORMAL HIGH (ref 11.5–15.5)
WBC: 18 10*3/uL — ABNORMAL HIGH (ref 4.0–10.5)
nRBC: 6.4 % — ABNORMAL HIGH (ref 0.0–0.2)

## 2021-01-28 LAB — PROCALCITONIN: Procalcitonin: 3.57 ng/mL

## 2021-01-28 LAB — LACTIC ACID, PLASMA: Lactic Acid, Venous: 1 mmol/L (ref 0.5–1.9)

## 2021-01-28 MED ORDER — HYDROMORPHONE 1 MG/ML IV SOLN
INTRAVENOUS | Status: DC
  Administered 2021-01-30: 7 mg via INTRAVENOUS
  Administered 2021-01-30: 12.5 mg via INTRAVENOUS
  Administered 2021-01-30: 3.5 mg via INTRAVENOUS
  Administered 2021-01-30 (×2): 30 mg via INTRAVENOUS
  Administered 2021-01-31: 3 mg via INTRAVENOUS
  Administered 2021-01-31 (×2): 10 mg via INTRAVENOUS
  Administered 2021-01-31: 0 mg via INTRAVENOUS
  Administered 2021-01-31 (×2): 8 mg via INTRAVENOUS
  Administered 2021-01-31: 30 mg via INTRAVENOUS
  Administered 2021-02-01: 8.5 mg via INTRAVENOUS
  Administered 2021-02-01: 3 mg via INTRAVENOUS
  Administered 2021-02-01: 5.32 mg via INTRAVENOUS
  Filled 2021-01-28 (×4): qty 30

## 2021-01-28 MED ORDER — ACETAMINOPHEN 325 MG PO TABS
325.0000 mg | ORAL_TABLET | Freq: Once | ORAL | Status: AC
  Administered 2021-01-28: 325 mg via ORAL
  Filled 2021-01-28: qty 1

## 2021-01-28 MED ORDER — POTASSIUM CHLORIDE CRYS ER 20 MEQ PO TBCR
40.0000 meq | EXTENDED_RELEASE_TABLET | Freq: Once | ORAL | Status: AC
  Administered 2021-01-28: 40 meq via ORAL
  Filled 2021-01-28: qty 2

## 2021-01-28 MED ORDER — ACETAMINOPHEN 325 MG PO TABS
650.0000 mg | ORAL_TABLET | ORAL | Status: DC | PRN
  Administered 2021-01-28 (×3): 650 mg via ORAL
  Filled 2021-01-28 (×3): qty 2

## 2021-01-28 MED ORDER — SODIUM CHLORIDE 0.9 % IV SOLN
2.0000 g | Freq: Three times a day (TID) | INTRAVENOUS | Status: DC
  Administered 2021-01-28 – 2021-02-01 (×12): 2 g via INTRAVENOUS
  Filled 2021-01-28 (×15): qty 2

## 2021-01-28 MED ORDER — SODIUM CHLORIDE 0.9 % IV BOLUS
1000.0000 mL | Freq: Once | INTRAVENOUS | Status: AC
  Administered 2021-01-28: 1000 mL via INTRAVENOUS

## 2021-01-28 NOTE — Progress Notes (Signed)
[facility]                                                                              Patient Demographics  Arpan Eskelson, is a 30 y.o. male, DOB - 1990-09-16, WUX:[id number]  Admit date - 01/27/2021   Admitting Physician Eben Burow, MD  Outpatient Primary MD for the patient is Tresa Garter, MD  Outpatient specialists:   LOS - 1  days   Medical records reviewed and are as summarized below:    Chief complaint: Sickle cell pain crisis      Brief summary   Patient is a 30 year old male with sickle cell disease, chronic pain syndrome, opiate dependence and tolerance, anemia of chronic disease presented with low back pain, chest and leg pain consistent with his typical pain crisis.  Pain present for past 3 days before admission, no relief at home with his home medications.  Patient rated his pain as 9/10 characterized as intermittent and aching.  He was transferred to [facility] from [facility] with mildly elevated troponin.   Assessment & Plan    Principal Problem:   Sickle cell pain crisis ([place]) -Patient started on Dilaudid PCA pump for pain control as he is opiate tolerant.  He uses OxyContin and Percocet at home daily. -Continue current management, IV fluid hydration -Hemoglobin 7.8, will transfuse if less than 7  Active Problems:   Leukocytosis, fevers -Unclear etiology, chest x-ray and UA negative on admission, will obtain procalcitonin, lactic acid, blood cultures - repeat chest x-ray. -Placed on IV cefepime, IV fluid bolus.  Repeat labs.    Marijuana smoker -Chronic use, no acute issues  Tachycardia -Likely due to #1.  Troponins flat and improving   Moderate protein calorie malnutrition, underweight Estimated body mass index is 15.28 kg/m as calculated from the following:   Height as of this encounter: 6\' 2"  (1.88 m).   Weight as of this encounter: 54 kg.  Code Status: Full CODE STATUS DVT Prophylaxis:   rivaroxaban  (XARELTO) tablet 20 mg   Level of Care: Level of care: Telemetry Medical Family Communication: Discussed all imaging results, lab results, explained to the patient    Disposition Plan:     Status is: Inpatient  Remains inpatient appropriate because: Acute sickle cell pain crisis, on Dilaudid PCA pump, spiking fevers      Time Spent in minutes   35 minutes  Procedures:  None  Consultants:   None   Antimicrobials:   Anti-infectives (From admission, onward)    Start     Dose/Rate Route Frequency Ordered Stop   01/28/21 1400  ceFEPIme (MAXIPIME) 2 g in sodium chloride 0.9 % 100 mL IVPB        2 g 200 mL/hr over 30 Minutes Intravenous Every 8 hours 01/28/21 1233            Medications  Scheduled Meds:  folic acid  1 mg Oral Daily   gabapentin  300 mg Oral TID   HYDROmorphone   Intravenous Q4H   hydroxyurea  500 mg Oral BID   rivaroxaban  20 mg Oral Q supper   senna-docusate  1  tablet Oral BID   Continuous Infusions:  sodium chloride 100 mL/hr at 01/28/21 0529   ceFEPime (MAXIPIME) IV     diphenhydrAMINE     PRN Meds:.acetaminophen, diphenhydrAMINE **OR** diphenhydrAMINE, naloxone **AND** sodium chloride flush, ondansetron (ZOFRAN) IV, ondansetron **OR** [DISCONTINUED] ondansetron (ZOFRAN) IV, polyethylene glycol      Subjective:   Roney Youtz was seen and examined today.  Spiking fevers, temp of 101.1 F, no other infectious signs, similar episode overnight however refused labs or further work-up.  At the time of my encounter, patient reported pain in his lower back on Dilaudid PCA pump.  Patient denies dizziness, chest pain, shortness of breath, abdominal pain, N/V/D/C.  Objective:   Vitals:   01/28/21 0705 01/28/21 0750 01/28/21 1151 01/28/21 1158  BP:  110/66 119/71   Pulse:  100 (!) 127   Resp: (!) 21 18 (!) 22 (!) 22  Temp:  99.2 F (37.3 C) (!) 101.1 F (38.4 C)   TempSrc:  Oral Oral   SpO2: 100% 98% 95% 96%  Weight:      Height:         Intake/Output Summary (Last 24 hours) at 01/28/2021 1320 Last data filed at 01/28/2021 1025 Gross per 24 hour  Intake 1313.74 ml  Output 2150 ml  Net -836.26 ml     Wt Readings from Last 3 Encounters:  01/28/21 54 kg  01/26/21 60.8 kg  11/26/20 62.5 kg     Exam General: Alert and oriented x 3, NAD Cardiovascular: S1 S2 auscultated, RRR, tachycardia Respiratory: Clear to auscultation bilaterally, no wheezing Gastrointestinal: Soft, nontender, nondistended, + bowel sounds Ext: no pedal edema bilaterally Neuro: no new deficits Psych: Normal affect and demeanor, alert and oriented x3    Data Reviewed:  I have personally reviewed following labs and imaging studies  Micro Results Recent Results (from the past 240 hour(s))  Resp Panel by RT-PCR (Flu A&B, Covid) Nasopharyngeal Swab     Status: None   Collection Time: 01/26/21 10:12 PM   Specimen: Nasopharyngeal Swab; Nasopharyngeal(NP) swabs in vial transport medium  Result Value Ref Range Status   SARS Coronavirus 2 by RT PCR NEGATIVE NEGATIVE Final    Comment: (NOTE) SARS-CoV-2 target nucleic acids are NOT DETECTED.  The SARS-CoV-2 RNA is generally detectable in upper respiratory specimens during the acute phase of infection. The lowest concentration of SARS-CoV-2 viral copies this assay can detect is 138 copies/mL. A negative result does not preclude SARS-Cov-2 infection and should not be used as the sole basis for treatment or other patient management decisions. A negative result may occur with  improper specimen collection/handling, submission of specimen other than nasopharyngeal swab, presence of viral mutation(s) within the areas targeted by this assay, and inadequate number of viral copies(<138 copies/mL). A negative result must be combined with clinical observations, patient history, and epidemiological information. The expected result is Negative.  Fact Sheet for Patients:   EntrepreneurPulse.com.au  Fact Sheet for Healthcare Providers:  IncredibleEmployment.be  This test is no t yet approved or cleared by the [place] FDA and  has been authorized for detection and/or diagnosis of SARS-CoV-2 by FDA under an Emergency Use Authorization (EUA). This EUA will remain  in effect (meaning this test can be used) for the duration of the COVID-19 declaration under Section 564(b)(1) of the Act, 21 U.S.C.section 360bbb-3(b)(1), unless the authorization is terminated  or revoked sooner.       Influenza A by PCR NEGATIVE NEGATIVE Final   Influenza B by PCR NEGATIVE  NEGATIVE Final    Comment: (NOTE) The Xpert Xpress SARS-CoV-2/FLU/RSV plus assay is intended as an aid in the diagnosis of influenza from Nasopharyngeal swab specimens and should not be used as a sole basis for treatment. Nasal washings and aspirates are unacceptable for Xpert Xpress SARS-CoV-2/FLU/RSV testing.  Fact Sheet for Patients: EntrepreneurPulse.com.au  Fact Sheet for Healthcare Providers: IncredibleEmployment.be  This test is not yet approved or cleared by the [place] FDA and has been authorized for detection and/or diagnosis of SARS-CoV-2 by FDA under an Emergency Use Authorization (EUA). This EUA will remain in effect (meaning this test can be used) for the duration of the COVID-19 declaration under Section 564(b)(1) of the Act, 21 U.S.C. section 360bbb-3(b)(1), unless the authorization is terminated or revoked.  Performed at [facility], [address], [place], [place] [address]     Radiology Reports DG Chest 2 View  Result Date: 01/26/2021 CLINICAL DATA:  Chest pain.  Sickle cell. EXAM: CHEST - 2 VIEW COMPARISON:  Chest radiograph dated 11/24/2020 FINDINGS: No focal consolidation, pleural effusion, pneumothorax. Mild cardiomegaly. No acute osseous pathology. IMPRESSION: No active  cardiopulmonary disease. Electronically Signed   By: Anner Crete M.D.   On: 01/26/2021 19:13   CT Angio Chest PE W and/or Wo Contrast  Result Date: 01/26/2021 CLINICAL DATA:  Chest pain.  Sickle cell pain. EXAM: CT ANGIOGRAPHY CHEST WITH CONTRAST TECHNIQUE: Multidetector CT imaging of the chest was performed using the standard protocol during bolus administration of intravenous contrast. Multiplanar CT image reconstructions and MIPs were obtained to evaluate the vascular anatomy. CONTRAST:  16mL OMNIPAQUE IOHEXOL 350 MG/ML SOLN COMPARISON:  Chest radiograph dated 01/28/2021 and CT dated 10/12/2016. FINDINGS: Evaluation of this exam is limited due to respiratory motion artifact. Cardiovascular: There is no cardiomegaly or pericardial effusion. The thoracic aorta is unremarkable. The origins of the great vessels of the aortic arch appear maintained. No pulmonary artery embolus identified. Mediastinum/Nodes: No hilar or mediastinal adenopathy. Small amount of fluid noted within the esophagus. No mediastinal fluid collection. Lungs/Pleura: No focal consolidation, pleural effusion, or pneumothorax. Bibasilar subpleural linear atelectasis/scarring. The central airways are patent. Upper Abdomen: Right renal upper pole cortical scarring. Musculoskeletal: Sclerotic changes of the osseous structures and central depression of the thoracic endplate, stigmata of sickle cell. No acute osseous pathology. Review of the MIP images confirms the above findings. IMPRESSION: No acute intrathoracic pathology. No CT evidence of pulmonary embolism. Electronically Signed   By: Anner Crete M.D.   On: 01/26/2021 22:03   DG CHEST PORT 1 VIEW  Result Date: 01/28/2021 CLINICAL DATA:  Sickle cell pain EXAM: PORTABLE CHEST 1 VIEW COMPARISON:  Chest x-ray dated January 26, 2021 FINDINGS: Cardiac and mediastinal contours are unchanged. Mild left basilar opacity, likely due to scarring or atelectasis. No focal consolidation. No  large pleural effusion or pneumothorax. IMPRESSION: No active disease. Electronically Signed   By: Yetta Glassman M.D.   On: 01/28/2021 13:10    Lab Data:  CBC: Recent Labs  Lab 01/26/21 1940 01/27/21 2053 01/28/21 1242  WBC 22.6* 17.8* PENDING  HGB 8.7* 8.2* 7.8*  HCT 25.7* 23.5* 22.0*  MCV 86.5 86.1 84.9  PLT 356 272 308   Basic Metabolic Panel: Recent Labs  Lab 01/26/21 1940 01/27/21 2053  NA 135 130*  K 4.3 3.9  CL 101 98  CO2 26 22  GLUCOSE 124* 115*  BUN 9 10  CREATININE 0.52* 0.60*  CALCIUM 9.3 8.6*   GFR: Estimated Creatinine Clearance: 101.3 mL/min (A) (  by C-G formula based on SCr of 0.6 mg/dL (L)). Liver Function Tests: No results for input(s): AST, ALT, ALKPHOS, BILITOT, PROT, ALBUMIN in the last 168 hours. No results for input(s): LIPASE, AMYLASE in the last 168 hours. No results for input(s): AMMONIA in the last 168 hours. Coagulation Profile: No results for input(s): INR, PROTIME in the last 168 hours. Cardiac Enzymes: No results for input(s): CKTOTAL, CKMB, CKMBINDEX, TROPONINI in the last 168 hours. BNP (last 3 results) No results for input(s): PROBNP in the last 8760 hours. HbA1C: No results for input(s): HGBA1C in the last 72 hours. CBG: No results for input(s): GLUCAP in the last 168 hours. Lipid Profile: No results for input(s): CHOL, HDL, LDLCALC, TRIG, CHOLHDL, LDLDIRECT in the last 72 hours. Thyroid Function Tests: No results for input(s): TSH, T4TOTAL, FREET4, T3FREE, THYROIDAB in the last 72 hours. Anemia Panel: Recent Labs    01/26/21 2120  RETICCTPCT 9.4*   Urine analysis:    Component Value Date/Time   COLORURINE YELLOW 01/28/2021 0525   APPEARANCEUR CLEAR 01/28/2021 0525   LABSPEC <1.005 (L) 01/28/2021 0525   PHURINE 5.5 01/28/2021 0525   GLUCOSEU NEGATIVE 01/28/2021 0525   HGBUR SMALL (A) 01/28/2021 0525   BILIRUBINUR NEGATIVE 01/28/2021 0525   BILIRUBINUR small 05/12/2020 1227   KETONESUR 15 (A) 01/28/2021 0525    PROTEINUR NEGATIVE 01/28/2021 0525   UROBILINOGEN 4.0 (A) 05/12/2020 1227   UROBILINOGEN 2.0 (H) 05/01/2017 0955   NITRITE NEGATIVE 01/28/2021 0525   LEUKOCYTESUR TRACE (A) 01/28/2021 0525     Wendell Fiebig M.D. [facility] 01/28/2021, 1:20 PM  Available via Epic secure chat 7am-7pm After 7 pm, please refer to night coverage provider listed on amion.

## 2021-01-28 NOTE — Progress Notes (Signed)
Wasted 8.4 ml of Hydromorphone (PCA) with Burman Riis, Therapist, sports.

## 2021-01-28 NOTE — Progress Notes (Signed)
°   01/28/21 0149  Assess: MEWS Score  Temp (!) 102.7 F (39.3 C)  BP 120/61  Pulse Rate (!) 118  ECG Heart Rate (!) 118  Resp (!) 22  Level of Consciousness Alert  SpO2 90 %  O2 Device Room Air  Assess: MEWS Score  MEWS Temp 2  MEWS Systolic 0  MEWS Pulse 2  MEWS RR 1  MEWS LOC 0  MEWS Score 5  MEWS Score Color Red  Assess: if the MEWS score is Yellow or Red  Were vital signs taken at a resting state? Yes  Focused Assessment No change from prior assessment  Early Detection of Sepsis Score *See Row Information* Medium  MEWS guidelines implemented *See Row Information* No, previously red, continue vital signs every 4 hours  Paged on call MD again, awaiting response. Temp rising, HR unchanged. Patient still c/o 10/10 pain. Notified about pain again as well.

## 2021-01-28 NOTE — Progress Notes (Signed)
Patient refusing all labs to be drawn at this time. MD aware.

## 2021-01-28 NOTE — Progress Notes (Signed)
°   01/28/21 0327  Assess: MEWS Score  Temp (!) 102.4 F (39.1 C)  BP 117/72  Pulse Rate (!) 121  ECG Heart Rate (!) 112  Resp 19  Level of Consciousness Alert  SpO2 95 %  Assess: MEWS Score  MEWS Temp 2  MEWS Systolic 0  MEWS Pulse 2  MEWS RR 0  MEWS LOC 0  MEWS Score 4  MEWS Score Color Red  Assess: if the MEWS score is Yellow or Red  Were vital signs taken at a resting state? Yes  Focused Assessment No change from prior assessment  Early Detection of Sepsis Score *See Row Information* Medium  MEWS guidelines implemented *See Row Information* No, previously red, continue vital signs every 4 hours  Paged MD about fever, still elevated post-Tylenol. Orders received for additional dose.

## 2021-01-28 NOTE — Progress Notes (Signed)
HOSPITAL MEDICINE OVERNIGHT EVENT NOTE    Patient exhibiting red mews score with elevated heart rate, respiratory rate and fevers as high as 102.7 F.  Patient not exhibiting any symptoms to isolate source of infection.  COVID-19 PCR testing performed during initial hospitalization negative.  Chest imaging performed on initial evaluation unremarkable for any infectious process.  We will go ahead and obtain lactic acid, procalcitonin, blood cultures and urinalysis.  Will administer Tylenol.  Patient is hemodynamically stable and I do not believe that empiric antibiotics are indicated at this time.  Monitoring for symptomatic improvement.  Vernelle Emerald  MD [facility]

## 2021-01-28 NOTE — Progress Notes (Signed)
Pharmacy Antibiotic Note  Timothy Marks is a 30 y.o. male admitted on 01/27/2021 with fever of unknown origin.  Pharmacy has been consulted for cefepime dosing.  Plan: Cefepime 2g IV q 8 hrs. F/u cultures, renal function, and clinical course.  Height: 6\' 2"  (188 cm) Weight: 54 kg (119 lb 0.8 oz) IBW/kg (Calculated) : 82.2  Temp (24hrs), Avg:100.4 F (38 C), Min:98.2 F (36.8 C), Max:102.7 F (39.3 C)  Recent Labs  Lab 01/26/21 1940 01/27/21 2053 01/28/21 1242  WBC 22.6* 17.8* 18.0*  CREATININE 0.52* 0.60* 0.69  LATICACIDVEN  --   --  1.0    Estimated Creatinine Clearance: 101.3 mL/min (by C-G formula based on SCr of 0.69 mg/dL).    Allergies  Allergen Reactions   Buprenorphine Hcl Hives   Levaquin [Levofloxacin] Itching   Meperidine Rash   Morphine Hives   Morphine And Related Hives   Tramadol Hives   Vancomycin Itching   Zosyn [Piperacillin Sod-Tazobactam So] Itching, Rash and Other (See Comments)    Has taken rocephin in the past    Antimicrobials this admission:  Cefepime 12/15  Dose adjustments this admission:   Microbiology results:  12/15 BCx x 2 > 12/15 UCx >   Thank you for allowing pharmacy to be a part of this patients care.  [place] Crane, Roylene Reason, BCCP Clinical Pharmacist  01/28/2021 1:53 PM   [facility] pharmacy phone numbers are listed on [email or web address]

## 2021-01-28 NOTE — Progress Notes (Signed)
°   01/28/21 0049  Assess: MEWS Score  Temp (!) 102.5 F (39.2 C)  BP 125/68  Pulse Rate (!) 128  ECG Heart Rate (!) 119  Resp 18  Level of Consciousness Alert  SpO2 94 %  O2 Device Room Air  Assess: MEWS Score  MEWS Temp 2  MEWS Systolic 0  MEWS Pulse 2  MEWS RR 0  MEWS LOC 0  MEWS Score 4  MEWS Score Color Red  Assess: if the MEWS score is Yellow or Red  Were vital signs taken at a resting state? Yes  Focused Assessment No change from prior assessment  Early Detection of Sepsis Score *See Row Information* Medium  MEWS guidelines implemented *See Row Information* Yes  Treat  MEWS Interventions Escalated (See documentation below)  Pain Scale 0-10  Pain Score 10  Pain Type Chronic pain  Pain Location Generalized  Pain Frequency Constant  Take Vital Signs  Increase Vital Sign Frequency  Red: Q 1hr X 4 then Q 4hr X 4, if remains red, continue Q 4hrs  Escalate  MEWS: Escalate Red: discuss with charge nurse/RN and provider, consider discussing with RRT  Notify: Charge Nurse/RN  Name of Charge Nurse/RN Notified Jequetta  Date Charge Nurse/RN Notified 01/28/21  Time Charge Nurse/RN Notified 0108  Notify: Provider  Provider Name/Title Dr. Cyd Silence  Date Provider Notified 01/28/21  Time Provider Notified 0114  Notification Type Page  Notification Reason Change in status (MEWS Red)   MD notified for change in temperature. Patient still noted 10/10 pain unrelieved by PCA.

## 2021-01-28 NOTE — Telephone Encounter (Signed)
Percocet

## 2021-01-28 NOTE — Progress Notes (Signed)
Patient demonstrating inappropriate behaviors. Requested RN to rub his back, which RN did. Patient began moaning and saying, "ohh baby yeah." RN stopped rubbing his back and told patient not to call her that. Patient responded, "I didn't call you baby. You're not my fucking girl." RN left room at the time.   Patient also noting to be constantly requesting assistance adjusting his position and with his pillow/blankets. Patient then proceeds to move blankets and pillow himself.

## 2021-01-28 NOTE — Plan of Care (Signed)

## 2021-01-29 ENCOUNTER — Other Ambulatory Visit: Payer: Self-pay | Admitting: Internal Medicine

## 2021-01-29 DIAGNOSIS — D5700 Hb-SS disease with crisis, unspecified: Principal | ICD-10-CM

## 2021-01-29 LAB — BLOOD CULTURE ID PANEL (REFLEXED) - BCID2

## 2021-01-29 LAB — URINE CULTURE: Culture: NO GROWTH

## 2021-01-29 MED ORDER — OXYCODONE-ACETAMINOPHEN 10-325 MG PO TABS
1.0000 | ORAL_TABLET | Freq: Four times a day (QID) | ORAL | 0 refills | Status: DC | PRN
Start: 2021-01-31 — End: 2021-02-15

## 2021-01-29 MED ORDER — KETOROLAC TROMETHAMINE 15 MG/ML IJ SOLN
15.0000 mg | Freq: Four times a day (QID) | INTRAMUSCULAR | Status: DC
  Administered 2021-01-29 – 2021-02-01 (×13): 15 mg via INTRAVENOUS
  Filled 2021-01-29 (×13): qty 1

## 2021-01-29 NOTE — Progress Notes (Signed)
PHARMACY - PHYSICIAN COMMUNICATION CRITICAL VALUE ALERT - BLOOD CULTURE IDENTIFICATION (BCID)  Timothy Marks is an 30 y.o. male who presented to [facility] on 01/27/2021 with a chief complaint of  Sickle cell pain crisis  Assessment:   1/2 blood cultures growing Staphylococcus epidermidis--likely contaminant   Name of physician (or Provider) Contacted:  X. Blount  Current antibiotics:  Cefepime   Changes to prescribed antibiotics recommended:  No changes recommended at this time  Results for orders placed or performed during the hospital encounter of 01/27/21  Blood Culture ID Panel (Reflexed) (Collected: 01/28/2021 12:37 PM)  Result Value Ref Range   Enterococcus faecalis NOT DETECTED NOT DETECTED   Enterococcus Faecium NOT DETECTED NOT DETECTED   Listeria monocytogenes NOT DETECTED NOT DETECTED   Staphylococcus species DETECTED (A) NOT DETECTED   Staphylococcus aureus (BCID) NOT DETECTED NOT DETECTED   Staphylococcus epidermidis DETECTED (A) NOT DETECTED   Staphylococcus lugdunensis NOT DETECTED NOT DETECTED   Streptococcus species NOT DETECTED NOT DETECTED   Streptococcus agalactiae NOT DETECTED NOT DETECTED   Streptococcus pneumoniae NOT DETECTED NOT DETECTED   Streptococcus pyogenes NOT DETECTED NOT DETECTED   A.calcoaceticus-baumannii NOT DETECTED NOT DETECTED   Bacteroides fragilis NOT DETECTED NOT DETECTED   Enterobacterales NOT DETECTED NOT DETECTED   Enterobacter cloacae complex NOT DETECTED NOT DETECTED   Escherichia coli NOT DETECTED NOT DETECTED   Klebsiella aerogenes NOT DETECTED NOT DETECTED   Klebsiella oxytoca NOT DETECTED NOT DETECTED   Klebsiella pneumoniae NOT DETECTED NOT DETECTED   Proteus species NOT DETECTED NOT DETECTED   Salmonella species NOT DETECTED NOT DETECTED   Serratia marcescens NOT DETECTED NOT DETECTED   Haemophilus influenzae NOT DETECTED NOT DETECTED   Neisseria meningitidis NOT DETECTED NOT DETECTED   Pseudomonas aeruginosa NOT  DETECTED NOT DETECTED   Stenotrophomonas maltophilia NOT DETECTED NOT DETECTED   Candida albicans NOT DETECTED NOT DETECTED   Candida auris NOT DETECTED NOT DETECTED   Candida glabrata NOT DETECTED NOT DETECTED   Candida krusei NOT DETECTED NOT DETECTED   Candida parapsilosis NOT DETECTED NOT DETECTED   Candida tropicalis NOT DETECTED NOT DETECTED   Cryptococcus neoformans/gattii NOT DETECTED NOT DETECTED   Methicillin resistance mecA/C NOT DETECTED NOT DETECTED    [name] 01/29/2021  11:59 PM

## 2021-01-29 NOTE — Plan of Care (Signed)

## 2021-01-29 NOTE — Plan of Care (Signed)
Spoke with Ms. Timothy Hollis, NP provider with sickle cell service, they will assume care and see the patient today. I will sign off. I have also notified PPRN, bed pacement.    Estill Cotta M.D.  [facility] 01/29/2021, 9:10 AM

## 2021-01-29 NOTE — Telephone Encounter (Signed)
Will forward to provider  

## 2021-01-29 NOTE — TOC Progression Note (Signed)
Transition of Care [facility]) - Progression Note    Patient Details  Name: Timothy Marks MRN: [id number] Date of Birth: [birth year]  Transition of [facility] CM/SW Contact  Zenon Mayo, RN Phone Number: 01/29/2021, 6:17 PM  Clinical Narrative:    sickle cell, will txr to [place], on pca pump, sepsis  work/up, on iv abx, ivf's. TOC will continue to follow for dc needs.         Expected Discharge Plan and Services                                                 Social Determinants of Health (SDOH) Interventions    Readmission Risk Interventions Readmission Risk Prevention Plan 12/01/2020  Transportation Screening Complete  PCP or Specialist Appt within 3-5 Days Complete  HRI or [place] Complete  Social Work Consult for [place] Planning/Counseling Complete  Palliative Care Screening Not Applicable  Medication Review Press photographer) Complete  Some recent data might be hidden

## 2021-01-29 NOTE — Plan of Care (Signed)
°  Problem: Clinical Measurements: Goal: Ability to maintain clinical measurements within normal limits will improve Outcome: Progressing   Problem: Health Behavior/Discharge Planning: Goal: Ability to manage health-related needs will improve Outcome: Progressing   Problem: Clinical Measurements: Goal: Respiratory complications will improve Outcome: Progressing   Problem: Clinical Measurements: Goal: Cardiovascular complication will be avoided Outcome: Progressing   Problem: Pain Managment: Goal: General experience of comfort will improve Outcome: Progressing   Problem: Safety: Goal: Ability to remain free from injury will improve Outcome: Progressing   Problem: Elimination: Goal: Will not experience complications related to bowel motility Outcome: Progressing

## 2021-01-29 NOTE — Progress Notes (Signed)
Subjective: Timothy Marks is a 30 year old male with a medical history significant for sickle cell disease, chronic pain syndrome, opiate dependence and tolerance, oppositional defiance, and anemia of chronic disease was admitted for fever of unknown origin in the setting of sickle cell pain crisis.  Patient continues to complain of significant pain, generalized.  Pain is mostly to low back, upper and lower extremities.  He rates pain as 10/10 despite IV Dilaudid PCA.  He endorses chest pain, fatigue, and shortness of breath.  No urinary symptoms, nausea, vomiting, or diarrhea.  Objective:  Vital signs in last 24 hours:  Vitals:   01/29/21 0448 01/29/21 0450 01/29/21 0706 01/29/21 0916  BP:  124/66    Pulse:  (!) 114  (!) 119  Resp:  20 (!) 22 20  Temp:  99.6 F (37.6 C)    TempSrc:  Oral    SpO2:  99% 95%   Weight: 55.1 kg     Height:        Intake/Output from previous day:   Intake/Output Summary (Last 24 hours) at 01/29/2021 1144 Last data filed at 01/29/2021 0814 Gross per 24 hour  Intake 2151.68 ml  Output 3650 ml  Net -1498.32 ml    Physical Exam: General: Alert, awake, oriented x3, in moderate distress.  Visibly anxious. HEENT: [place]/AT PEERL, EOMI Neck: Trachea midline,  no masses, no thyromegal,y no JVD, no carotid bruit OROPHARYNX:  Moist, No exudate/ erythema/lesions.  Heart: Regular rate and rhythm, without murmurs, rubs, gallops, PMI non-displaced, no heaves or thrills on palpation.  Lungs: Clear to auscultation, no wheezing or rhonchi noted. No increased vocal fremitus resonant to percussion  Abdomen: Soft, nontender, nondistended, positive bowel sounds, no masses no hepatosplenomegaly noted..  Neuro: No focal neurological deficits noted cranial nerves II through XII grossly intact. DTRs 2+ bilaterally upper and lower extremities. Strength 5 out of 5 in bilateral upper and lower extremities. Musculoskeletal: No warm swelling or erythema around joints, no spinal  tenderness noted. Psychiatric: Patient alert and oriented x3, good insight and cognition, good recent to remote recall. Lymph node survey: No cervical axillary or inguinal lymphadenopathy noted.  Lab Results:  Basic Metabolic Panel:    Component Value Date/Time   NA 131 (L) 01/28/2021 1242   NA 140 05/12/2020 1115   K 3.3 (L) 01/28/2021 1242   CL 98 01/28/2021 1242   CO2 24 01/28/2021 1242   BUN 10 01/28/2021 1242   BUN 5 (L) 05/12/2020 1115   CREATININE 0.69 01/28/2021 1242   GLUCOSE 129 (H) 01/28/2021 1242   CALCIUM 8.3 (L) 01/28/2021 1242   CBC:    Component Value Date/Time   WBC 18.0 (H) 01/28/2021 1242   HGB 7.8 (L) 01/28/2021 1242   HGB 12.2 (L) 05/12/2020 1115   HCT 22.0 (L) 01/28/2021 1242   HCT 35.6 (L) 05/12/2020 1115   PLT 236 01/28/2021 1242   PLT 378 05/12/2020 1115   MCV 84.9 01/28/2021 1242   MCV 89 05/12/2020 1115   NEUTROABS 10.2 (H) 01/20/2021 0940   NEUTROABS 4.8 05/12/2020 1115   LYMPHSABS 1.5 01/20/2021 0940   LYMPHSABS 1.4 05/12/2020 1115   MONOABS 1.1 (H) 01/20/2021 0940   EOSABS 0.3 01/20/2021 0940   EOSABS 0.1 05/12/2020 1115   BASOSABS 0.1 01/20/2021 0940   BASOSABS 0.0 05/12/2020 1115    Recent Results (from the past 240 hour(s))  Resp Panel by RT-PCR (Flu A&B, Covid) Nasopharyngeal Swab     Status: None   Collection Time: 01/26/21 10:12 PM  Specimen: Nasopharyngeal Swab; Nasopharyngeal(NP) swabs in vial transport medium  Result Value Ref Range Status   SARS Coronavirus 2 by RT PCR NEGATIVE NEGATIVE Final    Comment: (NOTE) SARS-CoV-2 target nucleic acids are NOT DETECTED.  The SARS-CoV-2 RNA is generally detectable in upper respiratory specimens during the acute phase of infection. The lowest concentration of SARS-CoV-2 viral copies this assay can detect is 138 copies/mL. A negative result does not preclude SARS-Cov-2 infection and should not be used as the sole basis for treatment or other patient management decisions. A negative  result may occur with  improper specimen collection/handling, submission of specimen other than nasopharyngeal swab, presence of viral mutation(s) within the areas targeted by this assay, and inadequate number of viral copies(<138 copies/mL). A negative result must be combined with clinical observations, patient history, and epidemiological information. The expected result is Negative.  Fact Sheet for Patients:  EntrepreneurPulse.com.au  Fact Sheet for Healthcare Providers:  IncredibleEmployment.be  This test is no t yet approved or cleared by the [place] FDA and  has been authorized for detection and/or diagnosis of SARS-CoV-2 by FDA under an Emergency Use Authorization (EUA). This EUA will remain  in effect (meaning this test can be used) for the duration of the COVID-19 declaration under Section 564(b)(1) of the Act, 21 U.S.C.section 360bbb-3(b)(1), unless the authorization is terminated  or revoked sooner.       Influenza A by PCR NEGATIVE NEGATIVE Final   Influenza B by PCR NEGATIVE NEGATIVE Final    Comment: (NOTE) The Xpert Xpress SARS-CoV-2/FLU/RSV plus assay is intended as an aid in the diagnosis of influenza from Nasopharyngeal swab specimens and should not be used as a sole basis for treatment. Nasal washings and aspirates are unacceptable for Xpert Xpress SARS-CoV-2/FLU/RSV testing.  Fact Sheet for Patients: EntrepreneurPulse.com.au  Fact Sheet for Healthcare Providers: IncredibleEmployment.be  This test is not yet approved or cleared by the [place] FDA and has been authorized for detection and/or diagnosis of SARS-CoV-2 by FDA under an Emergency Use Authorization (EUA). This EUA will remain in effect (meaning this test can be used) for the duration of the COVID-19 declaration under Section 564(b)(1) of the Act, 21 U.S.C. section 360bbb-3(b)(1), unless the authorization is  terminated or revoked.  Performed at [facility], [address], [place], [place] [address]   Urine Culture     Status: None   Collection Time: 01/28/21  3:09 AM   Specimen: Urine, Clean Catch  Result Value Ref Range Status   Specimen Description URINE, CLEAN CATCH  Final   Special Requests NONE  Final   Culture   Final    NO GROWTH Performed at [facility], [place] [address] [place], [place] [address]    Report Status 01/29/2021 FINAL  Final  Culture, blood (Routine X 2) w Reflex to ID Panel     Status: None (Preliminary result)   Collection Time: 01/28/21 12:37 PM   Specimen: Left Antecubital; Blood  Result Value Ref Range Status   Specimen Description LEFT ANTECUBITAL  Final   Special Requests   Final    BOTTLES DRAWN AEROBIC AND ANAEROBIC Blood Culture adequate volume   Culture   Final    NO GROWTH < 24 HOURS Performed at [facility], [place] [address] [place], [place] [address]    Report Status PENDING  Incomplete  Culture, blood (Routine X 2) w Reflex to ID Panel     Status: None (Preliminary result)   Collection  Time: 01/28/21 12:43 PM   Specimen: BLOOD LEFT HAND  Result Value Ref Range Status   Specimen Description BLOOD LEFT HAND  Final   Special Requests   Final    BOTTLES DRAWN AEROBIC AND ANAEROBIC Blood Culture adequate volume   Culture   Final    NO GROWTH < 24 HOURS Performed at [facility], [address] [place], [place] [address]    Report Status PENDING  Incomplete    Studies/Results: DG CHEST PORT 1 VIEW  Result Date: 01/28/2021 CLINICAL DATA:  Sickle cell pain EXAM: PORTABLE CHEST 1 VIEW COMPARISON:  Chest x-ray dated January 26, 2021 FINDINGS: Cardiac and mediastinal contours are unchanged. Mild left basilar opacity, likely due to scarring or atelectasis. No focal consolidation. No large pleural effusion or pneumothorax. IMPRESSION: No active disease. Electronically Signed   By: Yetta Glassman M.D.   On: 01/28/2021  13:10    Medications: Scheduled Meds:  folic acid  1 mg Oral Daily   gabapentin  300 mg Oral TID   HYDROmorphone   Intravenous Q4H   hydroxyurea  500 mg Oral BID   rivaroxaban  20 mg Oral Q supper   senna-docusate  1 tablet Oral BID   Continuous Infusions:  sodium chloride 100 mL/hr at 01/29/21 0245   ceFEPime (MAXIPIME) IV 2 g (01/29/21 0446)   diphenhydrAMINE     PRN Meds:.acetaminophen, diphenhydrAMINE **OR** diphenhydrAMINE, naloxone **AND** sodium chloride flush, ondansetron (ZOFRAN) IV, ondansetron **OR** [DISCONTINUED] ondansetron (ZOFRAN) IV, polyethylene glycol  Consultants: None  Procedures: None  Antibiotics: IV cefepime Assessment/Plan: Principal Problem:   Sickle cell pain crisis ([place]) Active Problems:   Leukocytosis   Marijuana smoker  Sickle cell disease with pain crisis: Continue IV Dilaudid PCA Dilaudid 1 mg IV x1 Continue OxyContin Decrease IV fluids to KVO Toradol 15 mg IV every 6 hours Monitor vital signs very closely, reevaluate pain scale regularly, and supplemental oxygen as needed  Leukocytosis: Patient febrile overnight with a maximum temperature of 101.1.  Chest x-ray and CTA shows no acute cardiopulmonary process.  Blood cultures negative.  Lactic acid negative.  Urinalysis unremarkable.  Tylenol 650 mg every 4 hours as needed for fever.  We will continue to monitor closely.  Labs in AM.  Chronic pain syndrome: Continue home medications  Anemia of chronic disease: Patient's hemoglobin is stable, slightly below his baseline.  There is no clinical indication for blood transfusion today.  Monitor closely.  Transfuse 1 unit PRBCs if hemoglobin drops below 7.0 g/dL.  Follow labs in AM.  Tachycardia: Heart rate 110s-120s.  Troponins unremarkable.  Continue telemetry.  Chronic marijuana use: Patient counseled at length on the dangers of smoking especially in the setting of sickle cell disease.   Code Status: Full Code Family Communication:  N/A Disposition Plan: Not yet ready for discharge  [place], MSN, FNP-C Patient [facility] [address] [place], [place] [address] [phone]  If 7PM-7AM, please contact night-coverage.  01/29/2021, 11:44 AM  LOS: 2 days

## 2021-01-30 LAB — CBC WITH DIFFERENTIAL/PLATELET
Abs Immature Granulocytes: 0.2 10*3/uL — ABNORMAL HIGH (ref 0.00–0.07)
Basophils Absolute: 0 10*3/uL (ref 0.0–0.1)
Basophils Relative: 0 %
Eosinophils Absolute: 0 10*3/uL (ref 0.0–0.5)
Eosinophils Relative: 0 %
HCT: 13 % — ABNORMAL LOW (ref 39.0–52.0)
Hemoglobin: 4.5 g/dL — CL (ref 13.0–17.0)
Immature Granulocytes: 1 %
Lymphocytes Relative: 10 %
Lymphs Abs: 1.6 10*3/uL (ref 0.7–4.0)
MCH: 29.2 pg (ref 26.0–34.0)
MCHC: 34.6 g/dL (ref 30.0–36.0)
MCV: 84.4 fL (ref 80.0–100.0)
Monocytes Absolute: 1.5 10*3/uL — ABNORMAL HIGH (ref 0.1–1.0)
Monocytes Relative: 9 %
Neutro Abs: 13.4 10*3/uL — ABNORMAL HIGH (ref 1.7–7.7)
Neutrophils Relative %: 80 %
Platelets: 217 10*3/uL (ref 150–400)
RBC: 1.54 MIL/uL — ABNORMAL LOW (ref 4.22–5.81)
RDW: 19.2 % — ABNORMAL HIGH (ref 11.5–15.5)
WBC: 16.7 10*3/uL — ABNORMAL HIGH (ref 4.0–10.5)
nRBC: 42.3 % — ABNORMAL HIGH (ref 0.0–0.2)

## 2021-01-30 LAB — BASIC METABOLIC PANEL
Anion gap: 10 (ref 5–15)
BUN: 12 mg/dL (ref 6–20)
CO2: 24 mmol/L (ref 22–32)
Calcium: 8.3 mg/dL — ABNORMAL LOW (ref 8.9–10.3)
Chloride: 101 mmol/L (ref 98–111)
Creatinine, Ser: 0.5 mg/dL — ABNORMAL LOW (ref 0.61–1.24)
GFR, Estimated: >60 mL/min (ref >60)
Glucose, Bld: 116 mg/dL — ABNORMAL HIGH (ref 70–99)
Potassium: 3.1 mmol/L — ABNORMAL LOW (ref 3.5–5.1)
Sodium: 135 mmol/L (ref 135–145)

## 2021-01-30 LAB — PREPARE RBC (CROSSMATCH)

## 2021-01-30 MED ORDER — DIPHENHYDRAMINE HCL 50 MG/ML IJ SOLN
25.0000 mg | Freq: Once | INTRAMUSCULAR | Status: AC
  Administered 2021-01-30: 25 mg via INTRAVENOUS
  Filled 2021-01-30: qty 1

## 2021-01-30 MED ORDER — HYDROMORPHONE HCL 1 MG/ML IJ SOLN
1.0000 mg | Freq: Once | INTRAMUSCULAR | Status: AC
  Administered 2021-01-30: 1 mg via INTRAVENOUS
  Filled 2021-01-30: qty 1

## 2021-01-30 MED ORDER — SODIUM CHLORIDE 0.9% IV SOLUTION: Freq: Once | INTRAVENOUS | Status: AC

## 2021-01-30 MED ORDER — MELATONIN 5 MG PO TABS
5.0000 mg | ORAL_TABLET | Freq: Once | ORAL | Status: AC
  Administered 2021-01-30: 5 mg via ORAL
  Filled 2021-01-30: qty 1

## 2021-01-30 NOTE — Progress Notes (Signed)
Pt. On Dilaudid (PCA) 78mL wasted in stericycle with Higinio Plan., RN. For transport to [facility].

## 2021-01-30 NOTE — Progress Notes (Signed)
PHARMACY BRIEF NOTE: HYDROXYUREA   By [facility] policy, hydroxyurea is automatically held when any of the following laboratory values occur:  ANC < 2 K  Pltc < 80K in sickle-cell patients; < 100K in other patients  Hgb <= 6 in sickle-cell patients; < 8 in other patients  Reticulocytes < 80K when Hgb < 9  Hydroxyurea has been held (discontinued from profile) per policy.   Lenis Noon, PharmD 01/30/21 7:07 PM

## 2021-01-30 NOTE — Progress Notes (Signed)
Date and time results received: 01/30/21 2:33 PM  Test: hgb   Critical Value: 4.5  Name of Provider Notified: [place], [place] NP  Orders Received? Or Actions Taken?: Orders Received - See Orders for details

## 2021-01-30 NOTE — Progress Notes (Addendum)
Patient belongings: 2 cellphones (1 red iphone and 1 black iphone) 1 winter coat  Phone charger Underwear  Pair of nike slides [employer]

## 2021-01-30 NOTE — Progress Notes (Signed)
Subjective: Timothy Marks is a 30 year old male with a medical history significant for sickle cell disease, chronic pain syndrome, opiate dependence and tolerance, oppositional defiance, and anemia of chronic disease was admitted for fever of unknown origin in the setting of sickle cell pain crisis.  Patient continues to complain of significant pain, generalized.  Pain is mostly to low back, upper and lower extremities.  He rates pain as 8/10 despite IV Dilaudid PCA.  He endorses chest pain, fatigue, and shortness of breath.  No urinary symptoms, nausea, vomiting, or diarrhea.  Today, patient's hemoglobin is 4.7, which is below his baseline.  He denies any dizziness, heart palpitations, headache, or blurry vision.  Objective:  Vital signs in last 24 hours:  Vitals:   01/31/21 0400 01/31/21 0521 01/31/21 0637 01/31/21 0955  BP:  (!) 147/81  129/72  Pulse:  (!) 103  91  Resp: 18 18 18 19   Temp:  99.8 F (37.7 C)  98.8 F (37.1 C)  TempSrc:  Oral  Oral  SpO2: 95% 95% 94%   Weight:      Height:        Intake/Output from previous day:   Intake/Output Summary (Last 24 hours) at 01/31/2021 1221 Last data filed at 01/31/2021 0526 Gross per 24 hour  Intake 4311.09 ml  Output 2050 ml  Net 2261.09 ml    Physical Exam: General: Alert, awake, oriented x3, in moderate distress.  Visibly anxious. HEENT: [place]/AT PEERL, EOMI Neck: Trachea midline,  no masses, no thyromegal,y no JVD, no carotid bruit OROPHARYNX:  Moist, No exudate/ erythema/lesions.  Heart: Regular rate and rhythm, without murmurs, rubs, gallops, PMI non-displaced, no heaves or thrills on palpation.  Lungs: Clear to auscultation, no wheezing or rhonchi noted. No increased vocal fremitus resonant to percussion  Abdomen: Soft, nontender, nondistended, positive bowel sounds, no masses no hepatosplenomegaly noted..  Neuro: No focal neurological deficits noted cranial nerves II through XII grossly intact. DTRs 2+ bilaterally  upper and lower extremities. Strength 5 out of 5 in bilateral upper and lower extremities. Musculoskeletal: No warm swelling or erythema around joints, no spinal tenderness noted. Psychiatric: Patient alert and oriented x3, good insight and cognition, good recent to remote recall. Lymph node survey: No cervical axillary or inguinal lymphadenopathy noted.  Lab Results:  Basic Metabolic Panel:    Component Value Date/Time   NA 135 01/30/2021 1154   NA 140 05/12/2020 1115   K 3.1 (L) 01/30/2021 1154   CL 101 01/30/2021 1154   CO2 24 01/30/2021 1154   BUN 12 01/30/2021 1154   BUN 5 (L) 05/12/2020 1115   CREATININE 0.50 (L) 01/30/2021 1154   GLUCOSE 116 (H) 01/30/2021 1154   CALCIUM 8.3 (L) 01/30/2021 1154   CBC:    Component Value Date/Time   WBC 14.3 (H) 01/31/2021 0610   HGB 6.7 (LL) 01/31/2021 0610   HGB 12.2 (L) 05/12/2020 1115   HCT 19.7 (L) 01/31/2021 0610   HCT 35.6 (L) 05/12/2020 1115   PLT 299 01/31/2021 0610   PLT 378 05/12/2020 1115   MCV 86.8 01/31/2021 0610   MCV 89 05/12/2020 1115   NEUTROABS 13.4 (H) 01/30/2021 1350   NEUTROABS 4.8 05/12/2020 1115   LYMPHSABS 1.6 01/30/2021 1350   LYMPHSABS 1.4 05/12/2020 1115   MONOABS 1.5 (H) 01/30/2021 1350   EOSABS 0.0 01/30/2021 1350   EOSABS 0.1 05/12/2020 1115   BASOSABS 0.0 01/30/2021 1350   BASOSABS 0.0 05/12/2020 1115    Recent Results (from the past 240 hour(s))  Resp Panel by RT-PCR (Flu A&B, Covid) Nasopharyngeal Swab     Status: None   Collection Time: 01/26/21 10:12 PM   Specimen: Nasopharyngeal Swab; Nasopharyngeal(NP) swabs in vial transport medium  Result Value Ref Range Status   SARS Coronavirus 2 by RT PCR NEGATIVE NEGATIVE Final    Comment: (NOTE) SARS-CoV-2 target nucleic acids are NOT DETECTED.  The SARS-CoV-2 RNA is generally detectable in upper respiratory specimens during the acute phase of infection. The lowest concentration of SARS-CoV-2 viral copies this assay can detect is 138 copies/mL.  A negative result does not preclude SARS-Cov-2 infection and should not be used as the sole basis for treatment or other patient management decisions. A negative result may occur with  improper specimen collection/handling, submission of specimen other than nasopharyngeal swab, presence of viral mutation(s) within the areas targeted by this assay, and inadequate number of viral copies(<138 copies/mL). A negative result must be combined with clinical observations, patient history, and epidemiological information. The expected result is Negative.  Fact Sheet for Patients:  EntrepreneurPulse.com.au  Fact Sheet for Healthcare Providers:  IncredibleEmployment.be  This test is no t yet approved or cleared by the [place] FDA and  has been authorized for detection and/or diagnosis of SARS-CoV-2 by FDA under an Emergency Use Authorization (EUA). This EUA will remain  in effect (meaning this test can be used) for the duration of the COVID-19 declaration under Section 564(b)(1) of the Act, 21 U.S.C.section 360bbb-3(b)(1), unless the authorization is terminated  or revoked sooner.       Influenza A by PCR NEGATIVE NEGATIVE Final   Influenza B by PCR NEGATIVE NEGATIVE Final    Comment: (NOTE) The Xpert Xpress SARS-CoV-2/FLU/RSV plus assay is intended as an aid in the diagnosis of influenza from Nasopharyngeal swab specimens and should not be used as a sole basis for treatment. Nasal washings and aspirates are unacceptable for Xpert Xpress SARS-CoV-2/FLU/RSV testing.  Fact Sheet for Patients: EntrepreneurPulse.com.au  Fact Sheet for Healthcare Providers: IncredibleEmployment.be  This test is not yet approved or cleared by the [place] FDA and has been authorized for detection and/or diagnosis of SARS-CoV-2 by FDA under an Emergency Use Authorization (EUA). This EUA will remain in effect (meaning this test  can be used) for the duration of the COVID-19 declaration under Section 564(b)(1) of the Act, 21 U.S.C. section 360bbb-3(b)(1), unless the authorization is terminated or revoked.  Performed at [facility], [address] [place], [place] [address]   Urine Culture     Status: None   Collection Time: 01/28/21  3:09 AM   Specimen: Urine, Clean Catch  Result Value Ref Range Status   Specimen Description URINE, CLEAN CATCH  Final   Special Requests NONE  Final   Culture   Final    NO GROWTH Performed at [facility], [place] [address], [place], [place] [address]    Report Status 01/29/2021 FINAL  Final  Culture, blood (Routine X 2) w Reflex to ID Panel     Status: Abnormal   Collection Time: 01/28/21 12:37 PM   Specimen: Left Antecubital; Blood  Result Value Ref Range Status   Specimen Description LEFT ANTECUBITAL  Final   Special Requests   Final    BOTTLES DRAWN AEROBIC AND ANAEROBIC Blood Culture adequate volume   Culture  Setup Time   Final    GRAM POSITIVE COCCI IN CLUSTERS AEROBIC BOTTLE ONLY CRITICAL RESULT CALLED TO, READ BACK BY AND VERIFIED WITH: PHARMD GREG ABBOTT 01/29/21@23 :19 BY  TW    Culture (A)  Final    STAPHYLOCOCCUS EPIDERMIDIS THE SIGNIFICANCE OF ISOLATING THIS ORGANISM FROM A SINGLE SET OF BLOOD CULTURES WHEN MULTIPLE SETS ARE DRAWN IS UNCERTAIN. PLEASE NOTIFY THE MICROBIOLOGY DEPARTMENT WITHIN ONE WEEK IF SPECIATION AND SENSITIVITIES ARE REQUIRED. Performed at [facility], [place] [address], [place], [place] [address]    Report Status 01/31/2021 FINAL  Final  Blood Culture ID Panel (Reflexed)     Status: Abnormal   Collection Time: 01/28/21 12:37 PM  Result Value Ref Range Status   Enterococcus faecalis NOT DETECTED NOT DETECTED Final   Enterococcus Faecium NOT DETECTED NOT DETECTED Final   Listeria monocytogenes NOT DETECTED NOT DETECTED Final   Staphylococcus species DETECTED (A) NOT DETECTED Final    Comment: CRITICAL RESULT CALLED  TO, READ BACK BY AND VERIFIED WITH: PHARMD GREG ABBOTT 01/29/21@23 :19 BY TW    Staphylococcus aureus (BCID) NOT DETECTED NOT DETECTED Final   Staphylococcus epidermidis DETECTED (A) NOT DETECTED Final    Comment: CRITICAL RESULT CALLED TO, READ BACK BY AND VERIFIED WITH: PHARMD GREG ABBOTT 01/29/21@23 :19 BY TW    Staphylococcus lugdunensis NOT DETECTED NOT DETECTED Final   Streptococcus species NOT DETECTED NOT DETECTED Final   Streptococcus agalactiae NOT DETECTED NOT DETECTED Final   Streptococcus pneumoniae NOT DETECTED NOT DETECTED Final   Streptococcus pyogenes NOT DETECTED NOT DETECTED Final   A.calcoaceticus-baumannii NOT DETECTED NOT DETECTED Final   Bacteroides fragilis NOT DETECTED NOT DETECTED Final   Enterobacterales NOT DETECTED NOT DETECTED Final   Enterobacter cloacae complex NOT DETECTED NOT DETECTED Final   Escherichia coli NOT DETECTED NOT DETECTED Final   Klebsiella aerogenes NOT DETECTED NOT DETECTED Final   Klebsiella oxytoca NOT DETECTED NOT DETECTED Final   Klebsiella pneumoniae NOT DETECTED NOT DETECTED Final   Proteus species NOT DETECTED NOT DETECTED Final   Salmonella species NOT DETECTED NOT DETECTED Final   Serratia marcescens NOT DETECTED NOT DETECTED Final   Haemophilus influenzae NOT DETECTED NOT DETECTED Final   Neisseria meningitidis NOT DETECTED NOT DETECTED Final   Pseudomonas aeruginosa NOT DETECTED NOT DETECTED Final   Stenotrophomonas maltophilia NOT DETECTED NOT DETECTED Final   Candida albicans NOT DETECTED NOT DETECTED Final   Candida auris NOT DETECTED NOT DETECTED Final   Candida glabrata NOT DETECTED NOT DETECTED Final   Candida krusei NOT DETECTED NOT DETECTED Final   Candida parapsilosis NOT DETECTED NOT DETECTED Final   Candida tropicalis NOT DETECTED NOT DETECTED Final   Cryptococcus neoformans/gattii NOT DETECTED NOT DETECTED Final   Methicillin resistance mecA/C NOT DETECTED NOT DETECTED Final    Comment: Performed at [facility], [address] [place], [place] [address]  Culture, blood (Routine X 2) w Reflex to ID Panel     Status: None (Preliminary result)   Collection Time: 01/28/21 12:43 PM   Specimen: BLOOD LEFT HAND  Result Value Ref Range Status   Specimen Description BLOOD LEFT HAND  Final   Special Requests   Final    BOTTLES DRAWN AEROBIC AND ANAEROBIC Blood Culture adequate volume   Culture   Final    NO GROWTH 2 DAYS Performed at [facility], [place] [address], [place],  [address]    Report Status PENDING  Incomplete    Studies/Results: No results found.  Medications: Scheduled Meds:  folic acid  1 mg Oral Daily   gabapentin  300 mg Oral TID   HYDROmorphone   Intravenous Q4H   ketorolac  15 mg Intravenous Q6H   rivaroxaban  20 mg Oral Q supper   senna-docusate  1 tablet Oral BID   Continuous Infusions:  sodium chloride 10 mL/hr at 01/30/21 1539   ceFEPime (MAXIPIME) IV 2 g (01/31/21 0515)   diphenhydrAMINE     PRN Meds:.acetaminophen, diphenhydrAMINE **OR** diphenhydrAMINE, naloxone **AND** sodium chloride flush, ondansetron (ZOFRAN) IV, ondansetron **OR** [DISCONTINUED] ondansetron (ZOFRAN) IV, oxyCODONE-acetaminophen **AND** oxyCODONE, polyethylene glycol  Consultants: None  Procedures: None  Antibiotics: IV cefepime Assessment/Plan: Principal Problem:   Sickle cell pain crisis ([place]) Active Problems:   Leukocytosis   Marijuana smoker  Sickle cell disease with pain crisis: Continue IV Dilaudid PCA Continue OxyContin Decrease IV fluids to KVO Toradol 15 mg IV every 6 hours Monitor vital signs very closely, reevaluate pain scale regularly, and supplemental oxygen as needed  Fever of unknown origin: Patient has been afebrile overnight.  We will continue IV antibiotics.  Tylenol 650 mg every 4 hours as needed for fever.  Incentive spirometer.  Leukocytosis: Patient febrile overnight with a maximum temperature of 101.1.  Chest x-ray and CTA shows no  acute cardiopulmonary process.  Continue IV antibiotics blood cultures negative.  Lactic acid negative.  Urinalysis unremarkable.  Tylenol 650 mg every 4 hours as needed for fever.  We will continue to monitor closely.  Labs in AM.  Chronic pain syndrome: Continue home medications  Anemia of chronic disease: Today, patient's hemoglobin is 4.7, which is marked weight decreased from his baseline.  We will transfuse 2 units PRBCs.  Follow labs in AM.  Tachycardia: Heart rate 110s-120s.  Troponins unremarkable.  Continue telemetry.  Chronic marijuana use: Patient counseled at length on the dangers of smoking especially in the setting of sickle cell disease.   Code Status: Full Code Family Communication: N/A Disposition Plan: Not yet ready for discharge  [place], MSN, FNP-C Patient [place] [address] [place], [place] [address] [phone]  If 5PM-8AM, please contact night-coverage.  01/31/2021, 12:21 PM  LOS: 4 days

## 2021-01-31 LAB — CULTURE, BLOOD (ROUTINE X 2): Special Requests: ADEQUATE

## 2021-01-31 LAB — CBC
HCT: 19.7 % — ABNORMAL LOW (ref 39.0–52.0)
Hemoglobin: 6.7 g/dL — CL (ref 13.0–17.0)
MCH: 29.5 pg (ref 26.0–34.0)
MCHC: 34 g/dL (ref 30.0–36.0)
MCV: 86.8 fL (ref 80.0–100.0)
Platelets: 299 10*3/uL (ref 150–400)
RBC: 2.27 MIL/uL — ABNORMAL LOW (ref 4.22–5.81)
RDW: 16.4 % — ABNORMAL HIGH (ref 11.5–15.5)
WBC: 14.3 10*3/uL — ABNORMAL HIGH (ref 4.0–10.5)
nRBC: 98.7 % — ABNORMAL HIGH (ref 0.0–0.2)

## 2021-01-31 MED ORDER — OXYCODONE-ACETAMINOPHEN 5-325 MG PO TABS
1.0000 | ORAL_TABLET | Freq: Four times a day (QID) | ORAL | Status: DC | PRN
  Administered 2021-02-01: 08:00:00 1 via ORAL
  Filled 2021-01-31: qty 1

## 2021-01-31 MED ORDER — OXYCODONE HCL 5 MG PO TABS
5.0000 mg | ORAL_TABLET | Freq: Four times a day (QID) | ORAL | Status: DC | PRN
  Administered 2021-01-31 – 2021-02-01 (×3): 5 mg via ORAL
  Filled 2021-01-31 (×3): qty 1

## 2021-01-31 MED ORDER — MELATONIN 5 MG PO TABS
5.0000 mg | ORAL_TABLET | Freq: Once | ORAL | Status: AC
  Administered 2021-01-31: 23:00:00 5 mg via ORAL
  Filled 2021-01-31: qty 1

## 2021-01-31 NOTE — Progress Notes (Signed)
Pharmacy Antibiotic Note  Timothy Marks is a 30 y.o. male admitted on 01/27/2021 with fever of unknown origin.  Pharmacy was been consulted for cefepime dosing.  Today, WBC remains elevated and last fever was two days ago.  Question if positive blood culture is contaminant but procalcitonin was high at 3.57 suggesting infection.    Plan: Continue Cefepime 2g IV q 8 hrs. F/u cultures, renal function, clinical course and LOT.  Height: 6\' 2"  (188 cm) Weight: 55.1 kg (121 lb 7.6 oz) IBW/kg (Calculated) : 82.2  Temp (24hrs), Avg:99 F (37.2 C), Min:98.1 F (36.7 C), Max:99.8 F (37.7 C)  Recent Labs  Lab 01/26/21 1940 01/27/21 2053 01/28/21 1242 01/30/21 1154 01/30/21 1350 01/31/21 0610  WBC 22.6* 17.8* 18.0*  --  16.7* 14.3*  CREATININE 0.52* 0.60* 0.69 0.50*  --   --   LATICACIDVEN  --   --  1.0  --   --   --      Estimated Creatinine Clearance: 103.3 mL/min (A) (by C-G formula based on SCr of 0.5 mg/dL (L)).    Allergies  Allergen Reactions   Buprenorphine Hcl Hives   Levaquin [Levofloxacin] Itching   Meperidine Rash   Morphine Hives   Morphine And Related Hives   Tramadol Hives   Vancomycin Itching   Zosyn [Piperacillin Sod-Tazobactam So] Itching, Rash and Other (See Comments)    Has taken rocephin in the past    Antimicrobials this admission:  12/15 Cefepime >>  Microbiology results:  12/15 BCx x 2 > 1/2 blood cultures growing Staphylococcus epidermidis--likely contaminant 12/15 UCx > neg  Thank you for allowing pharmacy to be a part of this patients care.  Royetta Asal, PharmD, BCPS Clinical Pharmacist [place] Please utilize Amion for appropriate phone number to reach the unit pharmacist ([facility]) 01/31/2021 8:43 AM

## 2021-01-31 NOTE — Progress Notes (Addendum)
Subjective: Timothy Marks is a 30 year old male with a medical history significant for sickle cell disease, chronic pain syndrome, opiate dependence and tolerance, oppositional defiance, and anemia of chronic disease was admitted for fever of unknown origin in the setting of sickle cell pain crisis.  Patient continues to complain of significant pain, generalized.  Pain is mostly to low back, upper and lower extremities.  He rates pain as 8/10 despite IV Dilaudid PCA.  He endorses chest pain, fatigue, and shortness of breath.  No urinary symptoms, nausea, vomiting, or diarrhea. Hemoglobin improved to 6.7 g/dL, continues to be below patient's baseline.  He is status post 2 units PRBCs.  He denies any dizziness, headache, or blurry vision today. Objective:  Vital signs in last 24 hours:  Vitals:   01/31/21 0400 01/31/21 0521 01/31/21 0637 01/31/21 0955  BP:  (!) 147/81  129/72  Pulse:  (!) 103  91  Resp: 18 18 18 19   Temp:  99.8 F (37.7 C)  98.8 F (37.1 C)  TempSrc:  Oral  Oral  SpO2: 95% 95% 94%   Weight:      Height:        Intake/Output from previous day:   Intake/Output Summary (Last 24 hours) at 01/31/2021 1224 Last data filed at 01/31/2021 0526 Gross per 24 hour  Intake 4311.09 ml  Output 2050 ml  Net 2261.09 ml    Physical Exam: General: Alert, awake, oriented x3, in moderate distress.  Visibly anxious. HEENT: [place]/AT PEERL, EOMI Neck: Trachea midline,  no masses, no thyromegal,y no JVD, no carotid bruit OROPHARYNX:  Moist, No exudate/ erythema/lesions.  Heart: Regular rate and rhythm, without murmurs, rubs, gallops, PMI non-displaced, no heaves or thrills on palpation.  Lungs: Clear to auscultation, no wheezing or rhonchi noted. No increased vocal fremitus resonant to percussion  Abdomen: Soft, nontender, nondistended, positive bowel sounds, no masses no hepatosplenomegaly noted..  Neuro: No focal neurological deficits noted cranial nerves II through XII grossly  intact. DTRs 2+ bilaterally upper and lower extremities. Strength 5 out of 5 in bilateral upper and lower extremities. Musculoskeletal: No warm swelling or erythema around joints, no spinal tenderness noted. Psychiatric: Patient alert and oriented x3, good insight and cognition, good recent to remote recall. Lymph node survey: No cervical axillary or inguinal lymphadenopathy noted.  Lab Results:  Basic Metabolic Panel:    Component Value Date/Time   NA 135 01/30/2021 1154   NA 140 05/12/2020 1115   K 3.1 (L) 01/30/2021 1154   CL 101 01/30/2021 1154   CO2 24 01/30/2021 1154   BUN 12 01/30/2021 1154   BUN 5 (L) 05/12/2020 1115   CREATININE 0.50 (L) 01/30/2021 1154   GLUCOSE 116 (H) 01/30/2021 1154   CALCIUM 8.3 (L) 01/30/2021 1154   CBC:    Component Value Date/Time   WBC 14.3 (H) 01/31/2021 0610   HGB 6.7 (LL) 01/31/2021 0610   HGB 12.2 (L) 05/12/2020 1115   HCT 19.7 (L) 01/31/2021 0610   HCT 35.6 (L) 05/12/2020 1115   PLT 299 01/31/2021 0610   PLT 378 05/12/2020 1115   MCV 86.8 01/31/2021 0610   MCV 89 05/12/2020 1115   NEUTROABS 13.4 (H) 01/30/2021 1350   NEUTROABS 4.8 05/12/2020 1115   LYMPHSABS 1.6 01/30/2021 1350   LYMPHSABS 1.4 05/12/2020 1115   MONOABS 1.5 (H) 01/30/2021 1350   EOSABS 0.0 01/30/2021 1350   EOSABS 0.1 05/12/2020 1115   BASOSABS 0.0 01/30/2021 1350   BASOSABS 0.0 05/12/2020 1115    Recent  Results (from the past 240 hour(s))  Resp Panel by RT-PCR (Flu A&B, Covid) Nasopharyngeal Swab     Status: None   Collection Time: 01/26/21 10:12 PM   Specimen: Nasopharyngeal Swab; Nasopharyngeal(NP) swabs in vial transport medium  Result Value Ref Range Status   SARS Coronavirus 2 by RT PCR NEGATIVE NEGATIVE Final    Comment: (NOTE) SARS-CoV-2 target nucleic acids are NOT DETECTED.  The SARS-CoV-2 RNA is generally detectable in upper respiratory specimens during the acute phase of infection. The lowest concentration of SARS-CoV-2 viral copies this assay  can detect is 138 copies/mL. A negative result does not preclude SARS-Cov-2 infection and should not be used as the sole basis for treatment or other patient management decisions. A negative result may occur with  improper specimen collection/handling, submission of specimen other than nasopharyngeal swab, presence of viral mutation(s) within the areas targeted by this assay, and inadequate number of viral copies(<138 copies/mL). A negative result must be combined with clinical observations, patient history, and epidemiological information. The expected result is Negative.  Fact Sheet for Patients:  EntrepreneurPulse.com.au  Fact Sheet for Healthcare Providers:  IncredibleEmployment.be  This test is no t yet approved or cleared by the [place] FDA and  has been authorized for detection and/or diagnosis of SARS-CoV-2 by FDA under an Emergency Use Authorization (EUA). This EUA will remain  in effect (meaning this test can be used) for the duration of the COVID-19 declaration under Section 564(b)(1) of the Act, 21 U.S.C.section 360bbb-3(b)(1), unless the authorization is terminated  or revoked sooner.       Influenza A by PCR NEGATIVE NEGATIVE Final   Influenza B by PCR NEGATIVE NEGATIVE Final    Comment: (NOTE) The Xpert Xpress SARS-CoV-2/FLU/RSV plus assay is intended as an aid in the diagnosis of influenza from Nasopharyngeal swab specimens and should not be used as a sole basis for treatment. Nasal washings and aspirates are unacceptable for Xpert Xpress SARS-CoV-2/FLU/RSV testing.  Fact Sheet for Patients: EntrepreneurPulse.com.au  Fact Sheet for Healthcare Providers: IncredibleEmployment.be  This test is not yet approved or cleared by the [place] FDA and has been authorized for detection and/or diagnosis of SARS-CoV-2 by FDA under an Emergency Use Authorization (EUA). This EUA will  remain in effect (meaning this test can be used) for the duration of the COVID-19 declaration under Section 564(b)(1) of the Act, 21 U.S.C. section 360bbb-3(b)(1), unless the authorization is terminated or revoked.  Performed at [facility], [address], [place], [place] [address]   Urine Culture     Status: None   Collection Time: 01/28/21  3:09 AM   Specimen: Urine, Clean Catch  Result Value Ref Range Status   Specimen Description URINE, CLEAN CATCH  Final   Special Requests NONE  Final   Culture   Final    NO GROWTH Performed at [facility], [place] [address] [place], [place] [address]    Report Status 01/29/2021 FINAL  Final  Culture, blood (Routine X 2) w Reflex to ID Panel     Status: Abnormal   Collection Time: 01/28/21 12:37 PM   Specimen: Left Antecubital; Blood  Result Value Ref Range Status   Specimen Description LEFT ANTECUBITAL  Final   Special Requests   Final    BOTTLES DRAWN AEROBIC AND ANAEROBIC Blood Culture adequate volume   Culture  Setup Time   Final    GRAM POSITIVE COCCI IN CLUSTERS AEROBIC BOTTLE ONLY CRITICAL RESULT CALLED TO, READ BACK BY AND  VERIFIED WITH: PHARMD GREG ABBOTT 01/29/21@23 :19 BY TW    Culture (A)  Final    STAPHYLOCOCCUS EPIDERMIDIS THE SIGNIFICANCE OF ISOLATING THIS ORGANISM FROM A SINGLE SET OF BLOOD CULTURES WHEN MULTIPLE SETS ARE DRAWN IS UNCERTAIN. PLEASE NOTIFY THE MICROBIOLOGY DEPARTMENT WITHIN ONE WEEK IF SPECIATION AND SENSITIVITIES ARE REQUIRED. Performed at [facility], [place] [address], [place], [place] [address]    Report Status 01/31/2021 FINAL  Final  Blood Culture ID Panel (Reflexed)     Status: Abnormal   Collection Time: 01/28/21 12:37 PM  Result Value Ref Range Status   Enterococcus faecalis NOT DETECTED NOT DETECTED Final   Enterococcus Faecium NOT DETECTED NOT DETECTED Final   Listeria monocytogenes NOT DETECTED NOT DETECTED Final   Staphylococcus species DETECTED (A) NOT DETECTED Final     Comment: CRITICAL RESULT CALLED TO, READ BACK BY AND VERIFIED WITH: PHARMD GREG ABBOTT 01/29/21@23 :19 BY TW    Staphylococcus aureus (BCID) NOT DETECTED NOT DETECTED Final   Staphylococcus epidermidis DETECTED (A) NOT DETECTED Final    Comment: CRITICAL RESULT CALLED TO, READ BACK BY AND VERIFIED WITH: PHARMD GREG ABBOTT 01/29/21@23 :19 BY TW    Staphylococcus lugdunensis NOT DETECTED NOT DETECTED Final   Streptococcus species NOT DETECTED NOT DETECTED Final   Streptococcus agalactiae NOT DETECTED NOT DETECTED Final   Streptococcus pneumoniae NOT DETECTED NOT DETECTED Final   Streptococcus pyogenes NOT DETECTED NOT DETECTED Final   A.calcoaceticus-baumannii NOT DETECTED NOT DETECTED Final   Bacteroides fragilis NOT DETECTED NOT DETECTED Final   Enterobacterales NOT DETECTED NOT DETECTED Final   Enterobacter cloacae complex NOT DETECTED NOT DETECTED Final   Escherichia coli NOT DETECTED NOT DETECTED Final   Klebsiella aerogenes NOT DETECTED NOT DETECTED Final   Klebsiella oxytoca NOT DETECTED NOT DETECTED Final   Klebsiella pneumoniae NOT DETECTED NOT DETECTED Final   Proteus species NOT DETECTED NOT DETECTED Final   Salmonella species NOT DETECTED NOT DETECTED Final   Serratia marcescens NOT DETECTED NOT DETECTED Final   Haemophilus influenzae NOT DETECTED NOT DETECTED Final   Neisseria meningitidis NOT DETECTED NOT DETECTED Final   Pseudomonas aeruginosa NOT DETECTED NOT DETECTED Final   Stenotrophomonas maltophilia NOT DETECTED NOT DETECTED Final   Candida albicans NOT DETECTED NOT DETECTED Final   Candida auris NOT DETECTED NOT DETECTED Final   Candida glabrata NOT DETECTED NOT DETECTED Final   Candida krusei NOT DETECTED NOT DETECTED Final   Candida parapsilosis NOT DETECTED NOT DETECTED Final   Candida tropicalis NOT DETECTED NOT DETECTED Final   Cryptococcus neoformans/gattii NOT DETECTED NOT DETECTED Final   Methicillin resistance mecA/C NOT DETECTED NOT DETECTED Final     Comment: Performed at [facility], [address], [place], [place] [address]  Culture, blood (Routine X 2) w Reflex to ID Panel     Status: None (Preliminary result)   Collection Time: 01/28/21 12:43 PM   Specimen: BLOOD LEFT HAND  Result Value Ref Range Status   Specimen Description BLOOD LEFT HAND  Final   Special Requests   Final    BOTTLES DRAWN AEROBIC AND ANAEROBIC Blood Culture adequate volume   Culture   Final    NO GROWTH 2 DAYS Performed at [facility], [place] [address] [place], [place] [address]    Report Status PENDING  Incomplete    Studies/Results: No results found.  Medications: Scheduled Meds:  folic acid  1 mg Oral Daily   gabapentin  300 mg Oral TID   HYDROmorphone  Intravenous Q4H   ketorolac  15 mg Intravenous Q6H   rivaroxaban  20 mg Oral Q supper   senna-docusate  1 tablet Oral BID   Continuous Infusions:  sodium chloride 10 mL/hr at 01/30/21 1539   ceFEPime (MAXIPIME) IV 2 g (01/31/21 0515)   diphenhydrAMINE     PRN Meds:.acetaminophen, diphenhydrAMINE **OR** diphenhydrAMINE, naloxone **AND** sodium chloride flush, ondansetron (ZOFRAN) IV, ondansetron **OR** [DISCONTINUED] ondansetron (ZOFRAN) IV, oxyCODONE-acetaminophen **AND** oxyCODONE, polyethylene glycol  Consultants: None  Procedures: None  Antibiotics: IV cefepime Assessment/Plan: Principal Problem:   Sickle cell pain crisis ([place]) Active Problems:   Leukocytosis   Marijuana smoker  Sickle cell disease with pain crisis: Continue IV Dilaudid PCA Continue OxyContin Decrease IV fluids to KVO Toradol 15 mg IV every 6 hours Monitor vital signs very closely, reevaluate pain scale regularly, and supplemental oxygen as needed  Fever of unknown origin: Patient has been afebrile overnight.  We will continue IV antibiotics.  Tylenol 650 mg every 4 hours as needed for fever.  Incentive spirometer.  Leukocytosis: Patient afebrile.  Chest x-ray and CTA shows no acute  cardiopulmonary process.  Continue IV antibiotics blood cultures negative.  Lactic acid negative.  Urinalysis unremarkable.  Tylenol 650 mg every 4 hours as needed for fever.  We will continue to monitor closely.  Labs in AM.  Chronic pain syndrome: Continue home medications  Anemia of chronic disease: Patient's hemoglobin is 6.7, which is improved from previous.  He is status post 2 units PRBCs.  We will continue to monitor closely.  CBC in AM.  Tachycardia: Heart rate 110s-120s.  Troponins unremarkable.  Continue telemetry.  Chronic marijuana use: Patient counseled at length on the dangers of smoking especially in the setting of sickle cell disease.   Code Status: Full Code Family Communication: N/A Disposition Plan: Not yet ready for discharge  [place], MSN, FNP-C [facility] [address] [place], [place] [address] [phone]  If 5PM-8AM, please contact night-coverage.  01/31/2021, 12:24 PM  LOS: 4 days

## 2021-02-01 DIAGNOSIS — F1290 Cannabis use, unspecified, uncomplicated: Secondary | ICD-10-CM

## 2021-02-01 DIAGNOSIS — D72829 Elevated white blood cell count, unspecified: Secondary | ICD-10-CM

## 2021-02-01 LAB — BASIC METABOLIC PANEL
Anion gap: 8 (ref 5–15)
BUN: 8 mg/dL (ref 6–20)
CO2: 25 mmol/L (ref 22–32)
Calcium: 8.1 mg/dL — ABNORMAL LOW (ref 8.9–10.3)
Chloride: 101 mmol/L (ref 98–111)
Creatinine, Ser: 0.57 mg/dL — ABNORMAL LOW (ref 0.61–1.24)
GFR, Estimated: >60 mL/min (ref >60)
Glucose, Bld: 100 mg/dL — ABNORMAL HIGH (ref 70–99)
Potassium: 3 mmol/L — ABNORMAL LOW (ref 3.5–5.1)
Sodium: 134 mmol/L — ABNORMAL LOW (ref 135–145)

## 2021-02-01 LAB — LACTATE DEHYDROGENASE: LDH: 532 U/L — ABNORMAL HIGH (ref 98–192)

## 2021-02-01 LAB — CBC
HCT: 22.7 % — ABNORMAL LOW (ref 39.0–52.0)
Hemoglobin: 7.6 g/dL — ABNORMAL LOW (ref 13.0–17.0)
MCH: 30.4 pg (ref 26.0–34.0)
MCHC: 33.5 g/dL (ref 30.0–36.0)
MCV: 90.8 fL (ref 80.0–100.0)
Platelets: 348 10*3/uL (ref 150–400)
RBC: 2.5 MIL/uL — ABNORMAL LOW (ref 4.22–5.81)
RDW: 17.6 % — ABNORMAL HIGH (ref 11.5–15.5)
WBC: 9.3 10*3/uL (ref 4.0–10.5)
nRBC: 145.3 % — ABNORMAL HIGH (ref 0.0–0.2)

## 2021-02-01 MED ORDER — AZITHROMYCIN 500 MG PO TABS: 500.0000 mg | ORAL_TABLET | Freq: Every day | ORAL | 0 refills | Status: AC

## 2021-02-01 NOTE — Care Management Important Message (Signed)
Important Message  Patient Details IM Letter given to the Patient. Name: Timothy Marks MRN: [id number] Date of Birth: [birth year]   Medicare Important Message Given:  Yes     Kerin Salen 02/01/2021, 11:54 AM

## 2021-02-01 NOTE — Discharge Summary (Signed)
Physician Discharge Summary  Timothy Marks NUU:[id number] DOB: 08-10-1990 DOA: 01/27/2021  PCP: Tresa Garter, MD  Admit date: 01/27/2021  Discharge date: 02/01/2021  Discharge Diagnoses:  Principal Problem:   Sickle cell pain crisis ([place]) Active Problems:   Leukocytosis   Marijuana smoker   Discharge Condition: Stable  Disposition:   Follow-up Information     Tresa Garter, MD. Call in 1 week(s).   Specialty: Internal Medicine Contact information: [place] [address] [phone]                Pt is discharged home in good condition and is to follow up with Tresa Garter, MD this week to have labs evaluated. Timothy Marks is instructed to increase activity slowly and balance with rest for the next few days, and use prescribed medication to complete treatment of pain  Diet: Regular Wt Readings from Last 3 Encounters:  01/29/21 55.1 kg  01/26/21 60.8 kg  11/26/20 62.5 kg    History of present illness:    Hospital Course:  Patient was admitted for sickle cell pain crisis and managed appropriately with IVF, IV Dilaudid via PCA and IV Toradol, as well as other adjunct therapies per sickle cell pain management protocols.  Patient was therefore discharged home today in a hemodynamically stable condition.   [name] will follow-up with PCP within 1 week of this discharge. Timothy Marks was counseled extensively about nonpharmacologic means of pain management, patient verbalized understanding and was appreciative of  the care received during this admission.   We discussed the need for good hydration, monitoring of hydration status, avoidance of heat, cold, stress, and infection triggers. We discussed the need to be adherent with taking Hydrea and other home medications. Patient was reminded of the need to seek medical attention immediately if any symptom of bleeding, anemia, or infection occurs.  Discharge Exam: Vitals:    02/01/21 1012 02/01/21 1129  BP: 125/71   Pulse: 77   Resp: 14 13  Temp: 98.7 F (37.1 C)   SpO2: 95% 98%   Vitals:   02/01/21 0610 02/01/21 0724 02/01/21 1012 02/01/21 1129  BP: (!) 166/64  125/71   Pulse: (!) 107  77   Resp: 18 16 14 13   Temp: 100.2 F (37.9 C)  98.7 F (37.1 C)   TempSrc: Oral  Oral   SpO2: 100% 98% 95% 98%  Weight:      Height:        General appearance : Awake, alert, not in any distress. Speech Clear. Not toxic looking HEENT: Atraumatic and Normocephalic, pupils equally reactive to light and accomodation Neck: Supple, no JVD. No cervical lymphadenopathy.  Chest: Good air entry bilaterally, no added sounds  CVS: S1 S2 regular, no murmurs.  Abdomen: Bowel sounds present, Non tender and not distended with no gaurding, rigidity or rebound. Extremities: B/L Lower Ext shows no edema, both legs are warm to touch Neurology: Awake alert, and oriented X 3, CN II-XII intact, Non focal Skin: No Rash  Discharge Instructions  Discharge Instructions     Diet - low sodium heart healthy   Complete by: As directed    Increase activity slowly   Complete by: As directed       Allergies as of 02/01/2021       Reactions   Buprenorphine Hcl Hives   Levaquin [levofloxacin] Itching   Meperidine Rash   Morphine Hives   Morphine And Related Hives   Tramadol Hives  Vancomycin Itching   Zosyn [piperacillin Sod-tazobactam So] Itching, Rash, Other (See Comments)   Has taken rocephin in the past        Medication List     TAKE these medications    azithromycin 500 MG tablet Commonly known as: Zithromax Take 1 tablet (500 mg total) by mouth daily for 5 days. Take 1 tablet daily for 3 days.   folic acid 1 MG tablet Commonly known as: FOLVITE Take 1 tablet (1 mg total) by mouth daily.   gabapentin 300 MG capsule Commonly known as: NEURONTIN Take 1 capsule (300 mg total) by mouth 3 (three) times daily.   hydroxyurea 500 MG capsule Commonly known as:  HYDREA Take 1 capsule (500 mg total) by mouth 2 (two) times daily. May take with food to minimize GI side effects.   oxyCODONE-acetaminophen 10-325 MG tablet Commonly known as: PERCOCET Take 1 tablet by mouth every 6 (six) hours as needed for up to 15 days for pain.   OxyCONTIN 20 mg 12 hr tablet Generic drug: oxyCODONE Take 1 tablet (20 mg total) by mouth every 12 (twelve) hours.   rivaroxaban 20 MG Tabs tablet Commonly known as: Xarelto Take 1 tablet (20 mg total) by mouth daily with supper.        The results of significant diagnostics from this hospitalization (including imaging, microbiology, ancillary and laboratory) are listed below for reference.    Significant Diagnostic Studies: DG Chest 2 View  Result Date: 01/26/2021 CLINICAL DATA:  Chest pain.  Sickle cell. EXAM: CHEST - 2 VIEW COMPARISON:  Chest radiograph dated 11/24/2020 FINDINGS: No focal consolidation, pleural effusion, pneumothorax. Mild cardiomegaly. No acute osseous pathology. IMPRESSION: No active cardiopulmonary disease. Electronically Signed   By: Anner Crete M.D.   On: 01/26/2021 19:13   CT Angio Chest PE W and/or Wo Contrast  Result Date: 01/26/2021 CLINICAL DATA:  Chest pain.  Sickle cell pain. EXAM: CT ANGIOGRAPHY CHEST WITH CONTRAST TECHNIQUE: Multidetector CT imaging of the chest was performed using the standard protocol during bolus administration of intravenous contrast. Multiplanar CT image reconstructions and MIPs were obtained to evaluate the vascular anatomy. CONTRAST:  57mL OMNIPAQUE IOHEXOL 350 MG/ML SOLN COMPARISON:  Chest radiograph dated 01/28/2021 and CT dated 10/12/2016. FINDINGS: Evaluation of this exam is limited due to respiratory motion artifact. Cardiovascular: There is no cardiomegaly or pericardial effusion. The thoracic aorta is unremarkable. The origins of the great vessels of the aortic arch appear maintained. No pulmonary artery embolus identified. Mediastinum/Nodes: No hilar or  mediastinal adenopathy. Small amount of fluid noted within the esophagus. No mediastinal fluid collection. Lungs/Pleura: No focal consolidation, pleural effusion, or pneumothorax. Bibasilar subpleural linear atelectasis/scarring. The central airways are patent. Upper Abdomen: Right renal upper pole cortical scarring. Musculoskeletal: Sclerotic changes of the osseous structures and central depression of the thoracic endplate, stigmata of sickle cell. No acute osseous pathology. Review of the MIP images confirms the above findings. IMPRESSION: No acute intrathoracic pathology. No CT evidence of pulmonary embolism. Electronically Signed   By: Anner Crete M.D.   On: 01/26/2021 22:03   DG CHEST PORT 1 VIEW  Result Date: 01/28/2021 CLINICAL DATA:  Sickle cell pain EXAM: PORTABLE CHEST 1 VIEW COMPARISON:  Chest x-ray dated January 26, 2021 FINDINGS: Cardiac and mediastinal contours are unchanged. Mild left basilar opacity, likely due to scarring or atelectasis. No focal consolidation. No large pleural effusion or pneumothorax. IMPRESSION: No active disease. Electronically Signed   By: Yetta Glassman M.D.   On: 01/28/2021 13:10  Microbiology: Recent Results (from the past 240 hour(s))  Resp Panel by RT-PCR (Flu A&B, Covid) Nasopharyngeal Swab     Status: None   Collection Time: 01/26/21 10:12 PM   Specimen: Nasopharyngeal Swab; Nasopharyngeal(NP) swabs in vial transport medium  Result Value Ref Range Status   SARS Coronavirus 2 by RT PCR NEGATIVE NEGATIVE Final    Comment: (NOTE) SARS-CoV-2 target nucleic acids are NOT DETECTED.  The SARS-CoV-2 RNA is generally detectable in upper respiratory specimens during the acute phase of infection. The lowest concentration of SARS-CoV-2 viral copies this assay can detect is 138 copies/mL. A negative result does not preclude SARS-Cov-2 infection and should not be used as the sole basis for treatment or other patient management decisions. A negative  result may occur with  improper specimen collection/handling, submission of specimen other than nasopharyngeal swab, presence of viral mutation(s) within the areas targeted by this assay, and inadequate number of viral copies(<138 copies/mL). A negative result must be combined with clinical observations, patient history, and epidemiological information. The expected result is Negative.  Fact Sheet for Patients:  EntrepreneurPulse.com.au  Fact Sheet for Healthcare Providers:  IncredibleEmployment.be  This test is no t yet approved or cleared by the [place] FDA and  has been authorized for detection and/or diagnosis of SARS-CoV-2 by FDA under an Emergency Use Authorization (EUA). This EUA will remain  in effect (meaning this test can be used) for the duration of the COVID-19 declaration under Section 564(b)(1) of the Act, 21 U.S.C.section 360bbb-3(b)(1), unless the authorization is terminated  or revoked sooner.       Influenza A by PCR NEGATIVE NEGATIVE Final   Influenza B by PCR NEGATIVE NEGATIVE Final    Comment: (NOTE) The Xpert Xpress SARS-CoV-2/FLU/RSV plus assay is intended as an aid in the diagnosis of influenza from Nasopharyngeal swab specimens and should not be used as a sole basis for treatment. Nasal washings and aspirates are unacceptable for Xpert Xpress SARS-CoV-2/FLU/RSV testing.  Fact Sheet for Patients: EntrepreneurPulse.com.au  Fact Sheet for Healthcare Providers: IncredibleEmployment.be  This test is not yet approved or cleared by the [place] FDA and has been authorized for detection and/or diagnosis of SARS-CoV-2 by FDA under an Emergency Use Authorization (EUA). This EUA will remain in effect (meaning this test can be used) for the duration of the COVID-19 declaration under Section 564(b)(1) of the Act, 21 U.S.C. section 360bbb-3(b)(1), unless the authorization is  terminated or revoked.  Performed at [facility], [address], [place], [place] [address]   Urine Culture     Status: None   Collection Time: 01/28/21  3:09 AM   Specimen: Urine, Clean Catch  Result Value Ref Range Status   Specimen Description URINE, CLEAN CATCH  Final   Special Requests NONE  Final   Culture   Final    NO GROWTH Performed at [facility], [place] [address], [place], [place] [address]    Report Status 01/29/2021 FINAL  Final  Culture, blood (Routine X 2) w Reflex to ID Panel     Status: Abnormal   Collection Time: 01/28/21 12:37 PM   Specimen: Left Antecubital; Blood  Result Value Ref Range Status   Specimen Description LEFT ANTECUBITAL  Final   Special Requests   Final    BOTTLES DRAWN AEROBIC AND ANAEROBIC Blood Culture adequate volume   Culture  Setup Time   Final    GRAM POSITIVE COCCI IN CLUSTERS AEROBIC BOTTLE ONLY CRITICAL RESULT CALLED TO, READ BACK  BY AND VERIFIED WITH: PHARMD GREG ABBOTT 01/29/21@23 :19 BY TW    Culture (A)  Final    STAPHYLOCOCCUS EPIDERMIDIS THE SIGNIFICANCE OF ISOLATING THIS ORGANISM FROM A SINGLE SET OF BLOOD CULTURES WHEN MULTIPLE SETS ARE DRAWN IS UNCERTAIN. PLEASE NOTIFY THE MICROBIOLOGY DEPARTMENT WITHIN ONE WEEK IF SPECIATION AND SENSITIVITIES ARE REQUIRED. Performed at [facility], [place] [address] [place], [place] [address]    Report Status 01/31/2021 FINAL  Final  Blood Culture ID Panel (Reflexed)     Status: Abnormal   Collection Time: 01/28/21 12:37 PM  Result Value Ref Range Status   Enterococcus faecalis NOT DETECTED NOT DETECTED Final   Enterococcus Faecium NOT DETECTED NOT DETECTED Final   Listeria monocytogenes NOT DETECTED NOT DETECTED Final   Staphylococcus species DETECTED (A) NOT DETECTED Final    Comment: CRITICAL RESULT CALLED TO, READ BACK BY AND VERIFIED WITH: PHARMD GREG ABBOTT 01/29/21@23 :19 BY TW    Staphylococcus aureus (BCID) NOT DETECTED NOT DETECTED Final    Staphylococcus epidermidis DETECTED (A) NOT DETECTED Final    Comment: CRITICAL RESULT CALLED TO, READ BACK BY AND VERIFIED WITH: PHARMD GREG ABBOTT 01/29/21@23 :19 BY TW    Staphylococcus lugdunensis NOT DETECTED NOT DETECTED Final   Streptococcus species NOT DETECTED NOT DETECTED Final   Streptococcus agalactiae NOT DETECTED NOT DETECTED Final   Streptococcus pneumoniae NOT DETECTED NOT DETECTED Final   Streptococcus pyogenes NOT DETECTED NOT DETECTED Final   A.calcoaceticus-baumannii NOT DETECTED NOT DETECTED Final   Bacteroides fragilis NOT DETECTED NOT DETECTED Final   Enterobacterales NOT DETECTED NOT DETECTED Final   Enterobacter cloacae complex NOT DETECTED NOT DETECTED Final   Escherichia coli NOT DETECTED NOT DETECTED Final   Klebsiella aerogenes NOT DETECTED NOT DETECTED Final   Klebsiella oxytoca NOT DETECTED NOT DETECTED Final   Klebsiella pneumoniae NOT DETECTED NOT DETECTED Final   Proteus species NOT DETECTED NOT DETECTED Final   Salmonella species NOT DETECTED NOT DETECTED Final   Serratia marcescens NOT DETECTED NOT DETECTED Final   Haemophilus influenzae NOT DETECTED NOT DETECTED Final   Neisseria meningitidis NOT DETECTED NOT DETECTED Final   Pseudomonas aeruginosa NOT DETECTED NOT DETECTED Final   Stenotrophomonas maltophilia NOT DETECTED NOT DETECTED Final   Candida albicans NOT DETECTED NOT DETECTED Final   Candida auris NOT DETECTED NOT DETECTED Final   Candida glabrata NOT DETECTED NOT DETECTED Final   Candida krusei NOT DETECTED NOT DETECTED Final   Candida parapsilosis NOT DETECTED NOT DETECTED Final   Candida tropicalis NOT DETECTED NOT DETECTED Final   Cryptococcus neoformans/gattii NOT DETECTED NOT DETECTED Final   Methicillin resistance mecA/C NOT DETECTED NOT DETECTED Final    Comment: Performed at [facility], [address], [place], [place] [address]  Culture, blood (Routine X 2) w Reflex to ID Panel     Status: None (Preliminary result)    Collection Time: 01/28/21 12:43 PM   Specimen: BLOOD LEFT HAND  Result Value Ref Range Status   Specimen Description BLOOD LEFT HAND  Final   Special Requests   Final    BOTTLES DRAWN AEROBIC AND ANAEROBIC Blood Culture adequate volume   Culture   Final    NO GROWTH 4 DAYS Performed at [facility], [place] [address], [place], [place] [address]    Report Status PENDING  Incomplete     Labs: Basic Metabolic Panel: Recent Labs  Lab 01/26/21 1940 01/27/21 2053 01/28/21 1242 01/30/21 1154 02/01/21 0514  NA 135 130* 131* 135 134*  K 4.3 3.9 3.3* 3.1* 3.0*  CL 101 98 98 101 101  CO2 26 22 24 24 25   GLUCOSE 124* 115* 129* 116* 100*  BUN 9 10 10 12 8   CREATININE 0.52* 0.60* 0.69 0.50* 0.57*  CALCIUM 9.3 8.6* 8.3* 8.3* 8.1*   Liver Function Tests: Recent Labs  Lab 01/28/21 1242  AST 54*  ALT 17  ALKPHOS 213*  BILITOT 4.3*  PROT 7.0  ALBUMIN 3.5   No results for input(s): LIPASE, AMYLASE in the last 168 hours. No results for input(s): AMMONIA in the last 168 hours. CBC: Recent Labs  Lab 01/27/21 2053 01/28/21 1242 01/30/21 1350 01/31/21 0610 02/01/21 0514  WBC 17.8* 18.0* 16.7* 14.3* 9.3  NEUTROABS  --   --  13.4*  --   --   HGB 8.2* 7.8* 4.5* 6.7* 7.6*  HCT 23.5* 22.0* 13.0* 19.7* 22.7*  MCV 86.1 84.9 84.4 86.8 90.8  PLT 272 236 217 299 348   Cardiac Enzymes: No results for input(s): CKTOTAL, CKMB, CKMBINDEX, TROPONINI in the last 168 hours. BNP: Invalid input(s): POCBNP CBG: No results for input(s): GLUCAP in the last 168 hours.  Time coordinating discharge: 50 minutes  Signed:  [facility] 02/01/2021, 12:31 PM

## 2021-02-02 ENCOUNTER — Other Ambulatory Visit: Payer: Self-pay

## 2021-02-02 DIAGNOSIS — D571 Sickle-cell disease without crisis: Secondary | ICD-10-CM

## 2021-02-02 LAB — CULTURE, BLOOD (ROUTINE X 2)
Culture: NO GROWTH
Special Requests: ADEQUATE

## 2021-02-02 MED ORDER — GABAPENTIN 300 MG PO CAPS: 300.0000 mg | ORAL_CAPSULE | Freq: Three times a day (TID) | ORAL | 5 refills | Status: DC

## 2021-02-03 LAB — TYPE AND SCREEN
ABO/RH(D): A POS
Antibody Screen: NEGATIVE
Donor AG Type: NEGATIVE
Donor AG Type: NEGATIVE
Donor AG Type: NEGATIVE
Unit division: 0
Unit division: 0
Unit division: 0

## 2021-02-03 LAB — BPAM RBC
Blood Product Expiration Date: 202301192359
Blood Product Expiration Date: 202301242359
Blood Product Expiration Date: 202301252359
ISSUE DATE / TIME: 202212171605
ISSUE DATE / TIME: 202212172252
Unit Type and Rh: 9500
Unit Type and Rh: 9500
Unit Type and Rh: 9500

## 2021-02-11 ENCOUNTER — Other Ambulatory Visit: Payer: Self-pay | Admitting: Internal Medicine

## 2021-02-11 ENCOUNTER — Telehealth: Payer: Self-pay

## 2021-02-11 NOTE — Telephone Encounter (Signed)
Oxycodone  Percocet

## 2021-02-15 ENCOUNTER — Other Ambulatory Visit: Payer: Self-pay | Admitting: Internal Medicine

## 2021-02-15 DIAGNOSIS — G894 Chronic pain syndrome: Secondary | ICD-10-CM

## 2021-02-15 DIAGNOSIS — Z79891 Long term (current) use of opiate analgesic: Secondary | ICD-10-CM

## 2021-02-15 MED ORDER — OXYCONTIN 20 MG PO T12A: 20.0000 mg | EXTENDED_RELEASE_TABLET | Freq: Two times a day (BID) | ORAL | 0 refills | Status: DC

## 2021-02-15 MED ORDER — OXYCODONE-ACETAMINOPHEN 10-325 MG PO TABS: 1.0000 | ORAL_TABLET | Freq: Four times a day (QID) | ORAL | 0 refills | Status: DC | PRN

## 2021-03-01 ENCOUNTER — Other Ambulatory Visit: Payer: Self-pay | Admitting: Internal Medicine

## 2021-03-01 ENCOUNTER — Telehealth: Payer: Self-pay

## 2021-03-01 MED ORDER — OXYCODONE-ACETAMINOPHEN 10-325 MG PO TABS: 1.0000 | ORAL_TABLET | Freq: Four times a day (QID) | ORAL | 0 refills | Status: DC | PRN

## 2021-03-01 NOTE — Telephone Encounter (Signed)
Percocet

## 2021-03-04 ENCOUNTER — Telehealth: Payer: Self-pay

## 2021-03-04 NOTE — Telephone Encounter (Signed)
Transition Care Management Unsuccessful Follow-up Telephone Call  Date of discharge and from where:  02/01/2021  [facility]  Attempts:  1st Attempt  Reason for unsuccessful TCM follow-up call:  No answer/busy Tomasa Rand, RN, BSN, CEN [place] Coordinator [phone]

## 2021-03-04 NOTE — Telephone Encounter (Signed)
error 

## 2021-03-08 ENCOUNTER — Telehealth: Payer: Self-pay

## 2021-03-08 NOTE — Telephone Encounter (Signed)
Transition Care Management Unsuccessful Follow-up Telephone Call  Date of discharge and from where:  02/01/2021  [facility]  Attempts:  2nd Attempt  Reason for unsuccessful TCM follow-up call:  No answer/busy Tomasa Rand, RN, BSN, CEN [place] Coordinator [phone]

## 2021-03-15 ENCOUNTER — Telehealth: Payer: Self-pay

## 2021-03-15 ENCOUNTER — Other Ambulatory Visit: Payer: Self-pay | Admitting: Internal Medicine

## 2021-03-15 NOTE — Telephone Encounter (Signed)
Percocet

## 2021-03-17 ENCOUNTER — Other Ambulatory Visit: Payer: Self-pay | Admitting: Internal Medicine

## 2021-03-17 DIAGNOSIS — Z79891 Long term (current) use of opiate analgesic: Secondary | ICD-10-CM

## 2021-03-17 DIAGNOSIS — G894 Chronic pain syndrome: Secondary | ICD-10-CM

## 2021-03-17 MED ORDER — OXYCODONE-ACETAMINOPHEN 10-325 MG PO TABS: 1.0000 | ORAL_TABLET | Freq: Four times a day (QID) | ORAL | 0 refills | Status: DC | PRN

## 2021-03-17 MED ORDER — OXYCONTIN 20 MG PO T12A: 20.0000 mg | EXTENDED_RELEASE_TABLET | Freq: Two times a day (BID) | ORAL | 0 refills | Status: DC

## 2021-03-19 DIAGNOSIS — M545 Low back pain, unspecified: Secondary | ICD-10-CM | POA: Diagnosis not present

## 2021-03-19 DIAGNOSIS — D5700 Hb-SS disease with crisis, unspecified: Secondary | ICD-10-CM | POA: Diagnosis not present

## 2021-03-19 DIAGNOSIS — D649 Anemia, unspecified: Secondary | ICD-10-CM | POA: Diagnosis not present

## 2021-03-21 DIAGNOSIS — D5700 Hb-SS disease with crisis, unspecified: Secondary | ICD-10-CM | POA: Diagnosis not present

## 2021-03-21 DIAGNOSIS — R7989 Other specified abnormal findings of blood chemistry: Secondary | ICD-10-CM | POA: Diagnosis not present

## 2021-03-21 DIAGNOSIS — D649 Anemia, unspecified: Secondary | ICD-10-CM | POA: Diagnosis not present

## 2021-03-21 DIAGNOSIS — D72829 Elevated white blood cell count, unspecified: Secondary | ICD-10-CM | POA: Diagnosis not present

## 2021-03-25 ENCOUNTER — Telehealth (HOSPITAL_COMMUNITY): Payer: Self-pay | Admitting: *Deleted

## 2021-03-25 NOTE — Telephone Encounter (Signed)
Patient called requesting to come to the day hospital for sickle cell pain. The day hospital is unable to accept additional patients due to staffing. Patient advised to go to the ED if pain remains uncontrolled with home pain medications. Patient expresses an understanding.

## 2021-03-29 ENCOUNTER — Other Ambulatory Visit: Payer: Self-pay | Admitting: Internal Medicine

## 2021-03-31 ENCOUNTER — Other Ambulatory Visit: Payer: Self-pay | Admitting: Internal Medicine

## 2021-03-31 ENCOUNTER — Telehealth: Payer: Self-pay

## 2021-03-31 MED ORDER — OXYCODONE-ACETAMINOPHEN 10-325 MG PO TABS: 1.0000 | ORAL_TABLET | Freq: Four times a day (QID) | ORAL | 0 refills | Status: DC | PRN

## 2021-03-31 NOTE — Telephone Encounter (Signed)
Percocet

## 2021-04-12 ENCOUNTER — Telehealth: Payer: Self-pay

## 2021-04-12 ENCOUNTER — Other Ambulatory Visit: Payer: Self-pay | Admitting: Internal Medicine

## 2021-04-12 DIAGNOSIS — Z79899 Other long term (current) drug therapy: Secondary | ICD-10-CM | POA: Diagnosis not present

## 2021-04-12 DIAGNOSIS — R079 Chest pain, unspecified: Secondary | ICD-10-CM | POA: Diagnosis not present

## 2021-04-12 DIAGNOSIS — D571 Sickle-cell disease without crisis: Secondary | ICD-10-CM | POA: Diagnosis not present

## 2021-04-12 DIAGNOSIS — D5701 Hb-SS disease with acute chest syndrome: Secondary | ICD-10-CM | POA: Diagnosis not present

## 2021-04-12 DIAGNOSIS — R0789 Other chest pain: Secondary | ICD-10-CM | POA: Diagnosis not present

## 2021-04-12 DIAGNOSIS — M549 Dorsalgia, unspecified: Secondary | ICD-10-CM | POA: Diagnosis not present

## 2021-04-12 NOTE — Telephone Encounter (Signed)
Oxy contin Percocet

## 2021-04-13 DIAGNOSIS — R079 Chest pain, unspecified: Secondary | ICD-10-CM | POA: Diagnosis not present

## 2021-04-14 ENCOUNTER — Telehealth: Payer: Self-pay | Admitting: Internal Medicine

## 2021-04-15 ENCOUNTER — Other Ambulatory Visit: Payer: Self-pay | Admitting: Internal Medicine

## 2021-04-15 DIAGNOSIS — Z79891 Long term (current) use of opiate analgesic: Secondary | ICD-10-CM

## 2021-04-15 DIAGNOSIS — G894 Chronic pain syndrome: Secondary | ICD-10-CM

## 2021-04-15 MED ORDER — OXYCONTIN 20 MG PO T12A: 20.0000 mg | EXTENDED_RELEASE_TABLET | Freq: Two times a day (BID) | ORAL | 0 refills | Status: DC

## 2021-04-15 MED ORDER — OXYCODONE-ACETAMINOPHEN 10-325 MG PO TABS: 1.0000 | ORAL_TABLET | Freq: Four times a day (QID) | ORAL | 0 refills | Status: DC | PRN

## 2021-04-22 ENCOUNTER — Other Ambulatory Visit: Payer: Self-pay

## 2021-04-22 ENCOUNTER — Encounter: Payer: Self-pay | Admitting: Internal Medicine

## 2021-04-22 ENCOUNTER — Ambulatory Visit: Payer: Medicare Other | Admitting: Internal Medicine

## 2021-04-22 VITALS — BP 116/64 | HR 84 | Temp 97.9°F | Ht 74.0 in | Wt 120.0 lb

## 2021-04-22 DIAGNOSIS — D571 Sickle-cell disease without crisis: Secondary | ICD-10-CM

## 2021-04-22 DIAGNOSIS — Z23 Encounter for immunization: Secondary | ICD-10-CM

## 2021-04-22 DIAGNOSIS — E559 Vitamin D deficiency, unspecified: Secondary | ICD-10-CM | POA: Diagnosis not present

## 2021-04-22 DIAGNOSIS — Z114 Encounter for screening for human immunodeficiency virus [HIV]: Secondary | ICD-10-CM

## 2021-04-22 DIAGNOSIS — F1120 Opioid dependence, uncomplicated: Secondary | ICD-10-CM

## 2021-04-22 DIAGNOSIS — Z202 Contact with and (suspected) exposure to infections with a predominantly sexual mode of transmission: Secondary | ICD-10-CM

## 2021-04-22 NOTE — Progress Notes (Signed)
Timothy Marks, is a 31 y.o. male  YWV:[id number]  [id number]:[id number]  DOB - 10-17-1990  Chief Complaint  Patient presents with   Follow-up    Patient is here today for his follow up visit. Patient is needing a flu vaccine today as well.   Pain Management       Subjective:   Timothy Marks is a 31 y.o. male here today for a follow up visit. Patient has No headache, No chest pain, No abdominal pain - No Nausea, No new weakness tingling or numbness, No Cough - SOB.  No problems updated.  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 Authorizing Provider  folic acid (FOLVITE) 1 MG tablet Take 1 tablet (1 mg total) by mouth daily. 05/07/20  Yes Tresa Garter, MD  gabapentin (NEURONTIN) 300 MG capsule Take 1 capsule (300 mg total) by mouth 3 (three) times daily. 02/02/21  Yes Tresa Garter, MD  hydroxyurea (HYDREA) 500 MG capsule Take 1 capsule (500 mg total) by mouth 2 (two) times daily. May take with food to minimize GI side effects. 05/07/20  Yes Tresa Garter, MD  oxyCODONE-acetaminophen (PERCOCET) 10-325 MG tablet Take 1 tablet by mouth every 6 (six) hours as needed for up to 15 days for pain. 04/15/21 04/30/21 Yes Katelynn Heidler E, MD  OXYCONTIN 20 MG 12 hr tablet Take 1 tablet (20 mg total) by mouth every 12 (twelve)  hours. 04/15/21 05/15/21 Yes Tresa Garter, MD  rivaroxaban (XARELTO) 20 MG TABS tablet Take 1 tablet (20 mg total) by mouth daily with supper. 06/24/20  Yes Tresa Garter, MD    Objective:   Vitals:   04/22/21 1041  BP: 116/64  Pulse: 84  Temp: 97.9 F (36.6 C)  SpO2: 100%  Weight: 120 lb (54.4 kg)  Height: '6\' 2"'$  (1.88 m)   Exam General appearance : Awake, alert, not in any distress. Speech Clear. Not toxic looking HEENT: Atraumatic and Normocephalic, pupils equally reactive to light and accomodation Neck: Supple, no JVD. No cervical lymphadenopathy.  Chest: Good air entry bilaterally, no added sounds  CVS: S1 S2 regular, no murmurs.  Abdomen: Bowel sounds present, Non tender and not distended with no gaurding, rigidity or rebound. Extremities: B/L Lower Ext shows no edema, both legs are warm to touch Neurology: Awake alert, and oriented X 3, CN II-XII intact, Non focal Skin: No Rash  Data Review No results found for: "HGBA1C"  Assessment & Plan   1. Needs flu shot  - Flu Vaccine QUAD 15moIM (Fluarix, Fluzone & Alfiuria Quad PF) - Given today  2. Sickle cell disease without crisis ([facility]  - CMP and Liver - CBC with Differential - Urinalysis, Complete - Ambulatory referral to Ophthalmology  3. Vitamin D deficiency  - Vitamin D, 25-hydroxy  4.  Narcotic dependence (HCC)  - Drug Screen, Urine   6. Encounter for screening for human immunodeficiency virus (HIV)  - HIV antibody (with reflex)    Patient have been counseled extensively about nutrition and exercise. Other issues discussed during this visit include: low cholesterol diet, weight control and daily exercise, foot care, annual eye examinations at Ophthalmology, importance of adherence with medications and regular follow-up. We also discussed long term complications of uncontrolled diabetes and hypertension.   Return in about 3 months (around 07/23/2021) for Sickle Cell Disease/Pain.  The patient  was given clear instructions to go to ER or return to medical center if symptoms don't improve, worsen or new problems develop. The patient verbalized understanding. The patient was told to call to get lab results if they haven't heard anything in the next week.   This note has been created with Surveyor, quantity. Any transcriptional errors are unintentional.    Angelica Chessman, MD, MHA, Karilyn [place], [place] [facility] [place], [place]   12/02/2021, 12:57 PM

## 2021-04-23 LAB — CBC WITH DIFFERENTIAL/PLATELET
Basophils Absolute: 0 10*3/uL (ref 0.0–0.2)
Basos: 0 %
EOS (ABSOLUTE): 0.2 10*3/uL (ref 0.0–0.4)
Eos: 2 %
Hematocrit: 30.2 % — ABNORMAL LOW (ref 37.5–51.0)
Hemoglobin: 9.7 g/dL — ABNORMAL LOW (ref 13.0–17.7)
Immature Grans (Abs): 0.1 10*3/uL (ref 0.0–0.1)
Immature Granulocytes: 1 %
Lymphocytes Absolute: 2 10*3/uL (ref 0.7–3.1)
Lymphs: 25 %
MCH: 29 pg (ref 26.6–33.0)
MCHC: 32.1 g/dL (ref 31.5–35.7)
MCV: 90 fL (ref 79–97)
Monocytes Absolute: 0.5 10*3/uL (ref 0.1–0.9)
Monocytes: 6 %
NRBC: 6 % — ABNORMAL HIGH (ref 0–0)
Neutrophils Absolute: 5.5 10*3/uL (ref 1.4–7.0)
Neutrophils: 66 %
Platelets: 529 10*3/uL — ABNORMAL HIGH (ref 150–450)
RBC: 3.34 x10E6/uL — ABNORMAL LOW (ref 4.14–5.80)
RDW: 21.2 % — ABNORMAL HIGH (ref 11.6–15.4)
WBC: 8.3 10*3/uL (ref 3.4–10.8)

## 2021-04-23 LAB — VITAMIN D 25 HYDROXY (VIT D DEFICIENCY, FRACTURES): Vit D, 25-Hydroxy: 13.6 ng/mL — ABNORMAL LOW (ref 30.0–100.0)

## 2021-04-23 LAB — CMP AND LIVER
ALT: 7 IU/L (ref 0–44)
AST: 16 IU/L (ref 0–40)
Albumin: 4.8 g/dL (ref 4.0–5.0)
Alkaline Phosphatase: 114 IU/L (ref 44–121)
BUN: 6 mg/dL (ref 6–20)
Bilirubin Total: 2.1 mg/dL — ABNORMAL HIGH (ref 0.0–1.2)
Bilirubin, Direct: 0.46 mg/dL — ABNORMAL HIGH (ref 0.00–0.40)
CO2: 23 mmol/L (ref 20–29)
Calcium: 9.7 mg/dL (ref 8.7–10.2)
Chloride: 105 mmol/L (ref 96–106)
Creatinine, Ser: 0.64 mg/dL — ABNORMAL LOW (ref 0.76–1.27)
Glucose: 91 mg/dL (ref 70–99)
Potassium: 4.7 mmol/L (ref 3.5–5.2)
Sodium: 139 mmol/L (ref 134–144)
Total Protein: 7.9 g/dL (ref 6.0–8.5)
eGFR: 129 mL/min/{1.73_m2} (ref >59)

## 2021-04-23 LAB — HIV ANTIBODY (ROUTINE TESTING W REFLEX): HIV Screen 4th Generation wRfx: NONREACTIVE

## 2021-04-26 ENCOUNTER — Other Ambulatory Visit: Payer: Self-pay | Admitting: Internal Medicine

## 2021-04-26 ENCOUNTER — Telehealth: Payer: Self-pay | Admitting: Internal Medicine

## 2021-04-26 NOTE — Telephone Encounter (Signed)
Percocet refill

## 2021-04-30 ENCOUNTER — Other Ambulatory Visit: Payer: Self-pay | Admitting: Internal Medicine

## 2021-04-30 MED ORDER — OXYCODONE-ACETAMINOPHEN 10-325 MG PO TABS: 1.0000 | ORAL_TABLET | Freq: Four times a day (QID) | ORAL | 0 refills | Status: DC | PRN

## 2021-05-03 ENCOUNTER — Emergency Department (HOSPITAL_COMMUNITY): Payer: Medicare Other

## 2021-05-03 ENCOUNTER — Encounter (HOSPITAL_COMMUNITY): Payer: Self-pay

## 2021-05-03 ENCOUNTER — Other Ambulatory Visit: Payer: Self-pay

## 2021-05-03 ENCOUNTER — Inpatient Hospital Stay (HOSPITAL_COMMUNITY)
Admission: EM | Admit: 2021-05-03 | Discharge: 2021-05-07 | DRG: 812 | Disposition: A | Discharge status: Home / Self Care | Payer: Medicare Other | Attending: Internal Medicine | Admitting: Internal Medicine

## 2021-05-03 DIAGNOSIS — G894 Chronic pain syndrome: Secondary | ICD-10-CM | POA: Diagnosis present

## 2021-05-03 DIAGNOSIS — Z881 Allergy status to other antibiotic agents status: Secondary | ICD-10-CM

## 2021-05-03 DIAGNOSIS — Z20822 Contact with and (suspected) exposure to covid-19: Secondary | ICD-10-CM | POA: Diagnosis present

## 2021-05-03 DIAGNOSIS — R4689 Other symptoms and signs involving appearance and behavior: Secondary | ICD-10-CM | POA: Diagnosis present

## 2021-05-03 DIAGNOSIS — Z87891 Personal history of nicotine dependence: Secondary | ICD-10-CM

## 2021-05-03 DIAGNOSIS — Z885 Allergy status to narcotic agent status: Secondary | ICD-10-CM

## 2021-05-03 DIAGNOSIS — Z7901 Long term (current) use of anticoagulants: Secondary | ICD-10-CM

## 2021-05-03 DIAGNOSIS — Z832 Family history of diseases of the blood and blood-forming organs and certain disorders involving the immune mechanism: Secondary | ICD-10-CM

## 2021-05-03 DIAGNOSIS — D5700 Hb-SS disease with crisis, unspecified: Secondary | ICD-10-CM | POA: Diagnosis not present

## 2021-05-03 DIAGNOSIS — R451 Restlessness and agitation: Secondary | ICD-10-CM | POA: Diagnosis present

## 2021-05-03 DIAGNOSIS — R079 Chest pain, unspecified: Secondary | ICD-10-CM | POA: Diagnosis not present

## 2021-05-03 DIAGNOSIS — F1120 Opioid dependence, uncomplicated: Secondary | ICD-10-CM | POA: Diagnosis present

## 2021-05-03 DIAGNOSIS — G8929 Other chronic pain: Secondary | ICD-10-CM | POA: Diagnosis present

## 2021-05-03 DIAGNOSIS — Z888 Allergy status to other drugs, medicaments and biological substances status: Secondary | ICD-10-CM

## 2021-05-03 DIAGNOSIS — Z86718 Personal history of other venous thrombosis and embolism: Secondary | ICD-10-CM

## 2021-05-03 DIAGNOSIS — R0789 Other chest pain: Secondary | ICD-10-CM | POA: Diagnosis not present

## 2021-05-03 DIAGNOSIS — Z86711 Personal history of pulmonary embolism: Secondary | ICD-10-CM

## 2021-05-03 LAB — RETICULOCYTES
Immature Retic Fract: 42.2 % — ABNORMAL HIGH (ref 2.3–15.9)
RBC.: 3.38 MIL/uL — ABNORMAL LOW (ref 4.22–5.81)
Retic Count, Absolute: 315.4 10*3/uL — ABNORMAL HIGH (ref 19.0–186.0)
Retic Ct Pct: 9.3 % — ABNORMAL HIGH (ref 0.4–3.1)

## 2021-05-03 MED ORDER — ONDANSETRON HCL 4 MG/2ML IJ SOLN
4.0000 mg | INTRAMUSCULAR | Status: DC | PRN
  Administered 2021-05-04: 4 mg via INTRAVENOUS
  Filled 2021-05-03: qty 2

## 2021-05-03 MED ORDER — SODIUM CHLORIDE 0.9 % IV SOLN
12.5000 mg | Freq: Once | INTRAVENOUS | Status: AC
  Administered 2021-05-03: 12.5 mg via INTRAVENOUS
  Filled 2021-05-03: qty 12.5

## 2021-05-03 MED ORDER — HYDROMORPHONE HCL 2 MG/ML IJ SOLN
2.0000 mg | INTRAMUSCULAR | Status: AC
  Administered 2021-05-03: 2 mg via INTRAVENOUS
  Filled 2021-05-03: qty 1

## 2021-05-03 MED ORDER — HYDROMORPHONE HCL 2 MG/ML IJ SOLN
2.0000 mg | INTRAMUSCULAR | Status: AC
  Administered 2021-05-04: 2 mg via INTRAVENOUS
  Filled 2021-05-03: qty 1

## 2021-05-03 MED ORDER — KETOROLAC TROMETHAMINE 15 MG/ML IJ SOLN
15.0000 mg | INTRAMUSCULAR | Status: AC
  Administered 2021-05-03: 15 mg via INTRAVENOUS
  Filled 2021-05-03: qty 1

## 2021-05-03 MED ORDER — KETAMINE HCL 50 MG/5ML IJ SOSY
0.3000 mg/kg | PREFILLED_SYRINGE | Freq: Once | INTRAMUSCULAR | Status: AC
Start: 2021-05-03 — End: 2021-05-03
  Administered 2021-05-03: 18 mg via INTRAVENOUS
  Filled 2021-05-03: qty 5

## 2021-05-03 MED ORDER — SODIUM CHLORIDE 0.45 % IV SOLN: INTRAVENOUS | Status: DC

## 2021-05-03 NOTE — ED Triage Notes (Signed)
Pt reports with [facility] since noon. Pt complains of pain all over.  ?

## 2021-05-03 NOTE — ED Provider Notes (Signed)
?[facility] ?Provider Note ? ? ?CSN: [id number] ?Arrival date & time: 05/03/21  2043 ? ?  ? ?History ? ?Chief Complaint  ?Patient presents with  ? Sickle Cell Pain Crisis  ? ? ?Timothy Marks is a 31 y.o. male. ? ?Patient with history of sickle cell anemia, opioid tolerance (OxyContin/oxycodone), most recent admission in [facility] system November 2022 however several in [facility] system since that time most recently end of February -- presents to the emergency department for "pain all over".  Patient has chest and back pain as well as pain in arms and legs.  Denies any known triggers.  States started today while lying in bed.  He reports taking his home medications without improvement.  No difficulty breathing or shortness of breath.  No fevers.  He denies joint swelling or lower extremity swelling.  States that symptoms are usually best treated with ketamine and Dilaudid. ? ? ?  ? ?Home Medications ?Prior to Admission medications   ?Medication Sig Start Date End Date Taking? Authorizing Provider  ?folic acid (FOLVITE) 1 MG tablet Take 1 tablet (1 mg total) by mouth daily. 05/07/20   Tresa Garter, MD  ?gabapentin (NEURONTIN) 300 MG capsule Take 1 capsule (300 mg total) by mouth 3 (three) times daily. 02/02/21   Tresa Garter, MD  ?hydroxyurea (HYDREA) 500 MG capsule Take 1 capsule (500 mg total) by mouth 2 (two) times daily. May take with food to minimize GI side effects. 05/07/20   Tresa Garter, MD  ?oxyCODONE-acetaminophen (PERCOCET) 10-325 MG tablet Take 1 tablet by mouth every 6 (six) hours as needed for up to 15 days for pain. 04/30/21 05/15/21  Tresa Garter, MD  ?OXYCONTIN 20 MG 12 hr tablet Take 1 tablet (20 mg total) by mouth every 12 (twelve) hours. 04/15/21 05/15/21  Tresa Garter, MD  ?rivaroxaban (XARELTO) 20 MG TABS tablet Take 1 tablet (20 mg total) by mouth daily with supper. 06/24/20   Tresa Garter, MD  ?   ? ?Allergies    ?Buprenorphine  hcl, Levaquin [levofloxacin], Meperidine, Morphine, Morphine and related, Tramadol, Vancomycin, and Zosyn [piperacillin sod-tazobactam so]   ? ?Review of Systems   ?Review of Systems ? ?Physical Exam ?Updated Vital Signs ?BP 123/67   Pulse 80   Temp 99.1 ?F (37.3 ?C) (Oral)   Resp 19   Ht '6\' 2"'$  (1.88 m)   Wt 61.2 kg   SpO2 96%   BMI 17.33 kg/m?  ? ?Physical Exam ?Vitals and nursing note reviewed.  ?Constitutional:   ?   General: He is not in acute distress. ?   Appearance: He is well-developed.  ?HENT:  ?   Head: Normocephalic and atraumatic.  ?   Right Ear: External ear normal.  ?   Left Ear: External ear normal.  ?   Nose: Nose normal.  ?Eyes:  ?   General:     ?   Right eye: No discharge.     ?   Left eye: No discharge.  ?   Conjunctiva/sclera: Conjunctivae normal.  ?Cardiovascular:  ?   Rate and Rhythm: Normal rate and regular rhythm.  ?   Heart sounds: Murmur heard.  ?   Comments: Soft systolic murmur noted on left sternal border ?Pulmonary:  ?   Effort: Pulmonary effort is normal.  ?   Breath sounds: Normal breath sounds.  ?Abdominal:  ?   Palpations: Abdomen is soft.  ?   Tenderness: There is no abdominal tenderness.  ?  Musculoskeletal:  ?   Cervical back: Normal range of motion and neck supple.  ?   Right lower leg: No edema.  ?   Left lower leg: No edema.  ?   Comments: No obvious joint swelling  ?Skin: ?   General: Skin is warm and dry.  ?Neurological:  ?   Mental Status: He is alert.  ? ? ?ED Results / Procedures / Treatments   ?Labs ?(all labs ordered are listed, but only abnormal results are displayed) ?Labs Reviewed  ?COMPREHENSIVE METABOLIC PANEL - Abnormal; Notable for the following components:  ?    Result Value  ? Sodium 133 (*)   ? Total Protein 8.5 (*)   ? Total Bilirubin 2.0 (*)   ? All other components within normal limits  ?CBC WITH DIFFERENTIAL/PLATELET - Abnormal; Notable for the following components:  ? WBC 13.8 (*)   ? RBC 3.43 (*)   ? Hemoglobin 10.5 (*)   ? HCT 30.3 (*)   ? RDW  18.9 (*)   ? Platelets 478 (*)   ? nRBC 1.9 (*)   ? Neutro Abs 10.5 (*)   ? Abs Immature Granulocytes 0.10 (*)   ? All other components within normal limits  ?RETICULOCYTES - Abnormal; Notable for the following components:  ? Retic Ct Pct 9.3 (*)   ? RBC. 3.38 (*)   ? Retic Count, Absolute 315.4 (*)   ? Immature Retic Fract 42.2 (*)   ? All other components within normal limits  ?RESP PANEL BY RT-PCR (FLU A&B, COVID) [id number]  ? ?ED ECG REPORT ? ? Date: 05/03/2021 ? Rate: 90 ? Rhythm: normal sinus rhythm ? QRS Axis: normal ? Intervals: normal ? ST/T Wave abnormalities: early repolarization ? Conduction Disutrbances:none ? Narrative Interpretation:  ? Old EKG Reviewed: unchanged except slower today ? ?I have personally reviewed the EKG tracing and agree with the computerized printout as noted. ? ? ?Radiology ?DG Chest Portable 1 View ? ?Result Date: 05/03/2021 ?CLINICAL DATA:  Sickle cell crisis, chest pain. EXAM: PORTABLE CHEST 1 VIEW COMPARISON:  Chest x-ray 04/12/2021. FINDINGS: The heart size and mediastinal contours are within normal limits. Both lungs are clear. The visualized skeletal structures are unremarkable. IMPRESSION: No active disease. Electronically Signed   By: Ronney Asters M.D.   On: 05/03/2021 21:53   ? ?Procedures ?Procedures  ? ? ?Medications Ordered in ED ?Medications  ?0.45 % sodium chloride infusion (has no administration in time range)  ?ketorolac (TORADOL) 15 MG/ML injection 15 mg (has no administration in time range)  ?diphenhydrAMINE (BENADRYL) 12.5 mg in sodium chloride 0.9 % 50 mL IVPB (has no administration in time range)  ?ondansetron (ZOFRAN) injection 4 mg (has no administration in time range)  ?ketamine 50 mg in normal saline 5 mL (10 mg/mL) syringe (has no administration in time range)  ?HYDROmorphone (DILAUDID) injection 2 mg (has no administration in time range)  ?HYDROmorphone (DILAUDID) injection 2 mg (has no administration in time range)  ?HYDROmorphone (DILAUDID) injection 2 mg  (has no administration in time range)  ? ? ?ED Course/ Medical Decision Making/ A&P ?  ? ?Patient seen and examined. History obtained directly from patient.  Reviewed previous hospital admission note from [facility]. ? ?Labs/EKG: Ordered CBC, CMP, reticulocytes. ? ?Imaging: Personally visualized chest x-ray, agree negative. ? ?Medications/Fluids: Ordered: sickle cell protocol medications and ketamine. Discussed ketamine dose for analgesia with Dr. Darl Householder.  ? ?Most recent vital signs reviewed and are as follows: ?BP 123/67   Pulse 80  Temp 99.1 ?F (37.3 ?C) (Oral)   Resp 19   Ht '6\' 2"'$  (1.88 m)   Wt 61.2 kg   SpO2 96%   BMI 17.33 kg/m?  ? ?Initial impression: Sickle cell pain ? ?1:22 AM Reassessment performed. Patient appears stable.  He states that his pain is unchanged.  He does not feel like he can go home at this point.  Requesting additional pain medication. ? ?Labs personally reviewed and interpreted including: CBC with white blood cell count 13.8, hemoglobin 10.5 which is near baseline; elevated reticulocytes; CMP total bili 2.0, normal kidney function. ? ?Imaging personally visualized and interpreted including: Chest x-ray agree negative. ? ?Reviewed pertinent lab work and imaging with patient at bedside. Questions answered.  ? ?Most current vital signs reviewed and are as follows: ?BP (!) 108/57   Pulse 77   Temp 99.1 ?F (37.3 ?C) (Oral)   Resp 14   Ht '6\' 2"'$  (1.88 m)   Wt 61.2 kg   SpO2 96%   BMI 17.33 kg/m?  ? ?Plan: We will request hospitalist evaluation for admission for continued pain control. ? ?1:38 AM Consulted with Dr. Hal Hope who will see patient.  ? ?CRITICAL CARE ?Performed by: Carlisle Cater PA-C ?Total critical care time: 45 minutes ?Critical care time was exclusive of separately billable procedures and treating other patients. ?Critical care was necessary to treat or prevent imminent or life-threatening deterioration. ?Critical care was time spent personally by me on the  following activities: development of treatment plan with patient and/or surrogate as well as nursing, discussions with consultants, evaluation of patient's response to treatment, examination of patient, obtaining history from patie

## 2021-05-04 ENCOUNTER — Encounter (HOSPITAL_COMMUNITY): Payer: Self-pay | Admitting: Internal Medicine

## 2021-05-04 DIAGNOSIS — Z881 Allergy status to other antibiotic agents status: Secondary | ICD-10-CM | POA: Diagnosis not present

## 2021-05-04 DIAGNOSIS — G894 Chronic pain syndrome: Secondary | ICD-10-CM | POA: Diagnosis present

## 2021-05-04 DIAGNOSIS — D5700 Hb-SS disease with crisis, unspecified: Secondary | ICD-10-CM | POA: Diagnosis not present

## 2021-05-04 DIAGNOSIS — Z885 Allergy status to narcotic agent status: Secondary | ICD-10-CM | POA: Diagnosis not present

## 2021-05-04 DIAGNOSIS — Z86711 Personal history of pulmonary embolism: Secondary | ICD-10-CM | POA: Diagnosis not present

## 2021-05-04 DIAGNOSIS — F1120 Opioid dependence, uncomplicated: Secondary | ICD-10-CM | POA: Diagnosis present

## 2021-05-04 DIAGNOSIS — Z87891 Personal history of nicotine dependence: Secondary | ICD-10-CM | POA: Diagnosis not present

## 2021-05-04 DIAGNOSIS — Z20822 Contact with and (suspected) exposure to covid-19: Secondary | ICD-10-CM | POA: Diagnosis present

## 2021-05-04 DIAGNOSIS — Z832 Family history of diseases of the blood and blood-forming organs and certain disorders involving the immune mechanism: Secondary | ICD-10-CM | POA: Diagnosis not present

## 2021-05-04 DIAGNOSIS — R451 Restlessness and agitation: Secondary | ICD-10-CM | POA: Diagnosis present

## 2021-05-04 DIAGNOSIS — Z888 Allergy status to other drugs, medicaments and biological substances status: Secondary | ICD-10-CM | POA: Diagnosis not present

## 2021-05-04 DIAGNOSIS — Z86718 Personal history of other venous thrombosis and embolism: Secondary | ICD-10-CM | POA: Diagnosis not present

## 2021-05-04 DIAGNOSIS — Z7901 Long term (current) use of anticoagulants: Secondary | ICD-10-CM | POA: Diagnosis not present

## 2021-05-04 LAB — CBC WITH DIFFERENTIAL/PLATELET
Abs Immature Granulocytes: 0.1 10*3/uL — ABNORMAL HIGH (ref 0.00–0.07)
Basophils Absolute: 0 10*3/uL (ref 0.0–0.1)
Basophils Relative: 0 %
Eosinophils Absolute: 0.2 10*3/uL (ref 0.0–0.5)
Eosinophils Relative: 1 %
HCT: 30.3 % — ABNORMAL LOW (ref 39.0–52.0)
Hemoglobin: 10.5 g/dL — ABNORMAL LOW (ref 13.0–17.0)
Immature Granulocytes: 1 %
Lymphocytes Relative: 15 %
Lymphs Abs: 2.1 10*3/uL (ref 0.7–4.0)
MCH: 30.6 pg (ref 26.0–34.0)
MCHC: 34.7 g/dL (ref 30.0–36.0)
MCV: 88.3 fL (ref 80.0–100.0)
Monocytes Absolute: 0.9 10*3/uL (ref 0.1–1.0)
Monocytes Relative: 7 %
Neutro Abs: 10.5 10*3/uL — ABNORMAL HIGH (ref 1.7–7.7)
Neutrophils Relative %: 76 %
Platelets: 478 10*3/uL — ABNORMAL HIGH (ref 150–400)
RBC: 3.43 MIL/uL — ABNORMAL LOW (ref 4.22–5.81)
RDW: 18.9 % — ABNORMAL HIGH (ref 11.5–15.5)
WBC: 13.8 10*3/uL — ABNORMAL HIGH (ref 4.0–10.5)
nRBC: 1.9 % — ABNORMAL HIGH (ref 0.0–0.2)

## 2021-05-04 LAB — RESP PANEL BY RT-PCR (FLU A&B, COVID) ARPGX2
Influenza A by PCR: NEGATIVE
Influenza B by PCR: NEGATIVE
SARS Coronavirus 2 by RT PCR: NEGATIVE

## 2021-05-04 LAB — COMPREHENSIVE METABOLIC PANEL
ALT: 10 U/L (ref 0–44)
AST: 24 U/L (ref 15–41)
Albumin: 4.7 g/dL (ref 3.5–5.0)
Alkaline Phosphatase: 92 U/L (ref 38–126)
Anion gap: 7 (ref 5–15)
BUN: 7 mg/dL (ref 6–20)
CO2: 25 mmol/L (ref 22–32)
Calcium: 8.9 mg/dL (ref 8.9–10.3)
Chloride: 101 mmol/L (ref 98–111)
Creatinine, Ser: 0.62 mg/dL (ref 0.61–1.24)
GFR, Estimated: >60 mL/min (ref >60)
Glucose, Bld: 97 mg/dL (ref 70–99)
Potassium: 3.7 mmol/L (ref 3.5–5.1)
Sodium: 133 mmol/L — ABNORMAL LOW (ref 135–145)
Total Bilirubin: 2 mg/dL — ABNORMAL HIGH (ref 0.3–1.2)
Total Protein: 8.5 g/dL — ABNORMAL HIGH (ref 6.5–8.1)

## 2021-05-04 MED ORDER — OXYCODONE HCL ER 20 MG PO T12A
20.0000 mg | EXTENDED_RELEASE_TABLET | Freq: Two times a day (BID) | ORAL | Status: DC
  Filled 2021-05-04: qty 1

## 2021-05-04 MED ORDER — SENNOSIDES-DOCUSATE SODIUM 8.6-50 MG PO TABS
1.0000 | ORAL_TABLET | Freq: Two times a day (BID) | ORAL | Status: DC
  Filled 2021-05-04: qty 1

## 2021-05-04 MED ORDER — POLYETHYLENE GLYCOL 3350 17 G PO PACK: 17.0000 g | PACK | Freq: Every day | ORAL | Status: DC | PRN

## 2021-05-04 MED ORDER — NALOXONE HCL 0.4 MG/ML IJ SOLN: 0.4000 mg | INTRAMUSCULAR | Status: DC | PRN

## 2021-05-04 MED ORDER — HYDROMORPHONE HCL 1 MG/ML IJ SOLN
1.0000 mg | INTRAMUSCULAR | Status: AC | PRN
  Administered 2021-05-04 – 2021-05-05 (×3): 1 mg via INTRAVENOUS
  Filled 2021-05-04 (×3): qty 1

## 2021-05-04 MED ORDER — HYDROXYUREA 500 MG PO CAPS
500.0000 mg | ORAL_CAPSULE | Freq: Two times a day (BID) | ORAL | Status: DC
  Administered 2021-05-04 – 2021-05-07 (×7): 500 mg via ORAL
  Filled 2021-05-04 (×8): qty 1

## 2021-05-04 MED ORDER — POLYETHYLENE GLYCOL 3350 17 G PO PACK: 17.0000 g | PACK | Freq: Every day | ORAL | Status: DC

## 2021-05-04 MED ORDER — SODIUM CHLORIDE 0.9% FLUSH: 9.0000 mL | INTRAVENOUS | Status: DC | PRN

## 2021-05-04 MED ORDER — KETOROLAC TROMETHAMINE 15 MG/ML IJ SOLN
15.0000 mg | Freq: Four times a day (QID) | INTRAMUSCULAR | Status: AC
  Administered 2021-05-04 – 2021-05-05 (×5): 15 mg via INTRAVENOUS
  Filled 2021-05-04 (×5): qty 1

## 2021-05-04 MED ORDER — FOLIC ACID 1 MG PO TABS
1.0000 mg | ORAL_TABLET | Freq: Every day | ORAL | Status: DC
  Administered 2021-05-04 – 2021-05-07 (×4): 1 mg via ORAL
  Filled 2021-05-04 (×4): qty 1

## 2021-05-04 MED ORDER — HYDROMORPHONE 1 MG/ML IV SOLN
INTRAVENOUS | Status: DC
  Administered 2021-05-04: 5.49 mg via INTRAVENOUS
  Administered 2021-05-04: 10.98 mg via INTRAVENOUS
  Administered 2021-05-04: 30 mg via INTRAVENOUS
  Filled 2021-05-04: qty 30

## 2021-05-04 MED ORDER — DIPHENHYDRAMINE HCL 50 MG/ML IJ SOLN
12.5000 mg | Freq: Once | INTRAMUSCULAR | Status: AC
  Administered 2021-05-04: 12.5 mg via INTRAVENOUS
  Filled 2021-05-04: qty 1

## 2021-05-04 MED ORDER — HYDROMORPHONE 1 MG/ML IV SOLN
INTRAVENOUS | Status: DC
  Administered 2021-05-04: 5.33 mg via INTRAVENOUS
  Administered 2021-05-04: 2 mg via INTRAVENOUS
  Administered 2021-05-05: 30 mg via INTRAVENOUS
  Administered 2021-05-05: 7 mg via INTRAVENOUS
  Administered 2021-05-05: 3.5 mg via INTRAVENOUS
  Administered 2021-05-05: 4.5 mg via INTRAVENOUS
  Administered 2021-05-05: 1 mg via INTRAVENOUS
  Administered 2021-05-05: 2.5 mg via INTRAVENOUS
  Administered 2021-05-06: 4 mg via INTRAVENOUS
  Administered 2021-05-06: 30 mg via INTRAVENOUS
  Administered 2021-05-06: 3 mg via INTRAVENOUS
  Filled 2021-05-04 (×2): qty 30

## 2021-05-04 MED ORDER — MORPHINE SULFATE ER 15 MG PO TBCR
15.0000 mg | EXTENDED_RELEASE_TABLET | Freq: Two times a day (BID) | ORAL | Status: DC
  Administered 2021-05-04 – 2021-05-07 (×6): 15 mg via ORAL
  Filled 2021-05-04 (×6): qty 1

## 2021-05-04 MED ORDER — SODIUM CHLORIDE 0.45 % IV SOLN: INTRAVENOUS | Status: AC

## 2021-05-04 MED ORDER — GABAPENTIN 300 MG PO CAPS
300.0000 mg | ORAL_CAPSULE | Freq: Three times a day (TID) | ORAL | Status: DC
  Administered 2021-05-04 – 2021-05-07 (×10): 300 mg via ORAL
  Filled 2021-05-04 (×11): qty 1

## 2021-05-04 MED ORDER — HYDROMORPHONE HCL 2 MG/ML IJ SOLN
2.0000 mg | Freq: Once | INTRAMUSCULAR | Status: AC
  Administered 2021-05-04: 2 mg via INTRAVENOUS
  Filled 2021-05-04: qty 1

## 2021-05-04 MED ORDER — RIVAROXABAN 20 MG PO TABS
20.0000 mg | ORAL_TABLET | Freq: Every day | ORAL | Status: DC
  Administered 2021-05-04 – 2021-05-06 (×3): 20 mg via ORAL
  Filled 2021-05-04 (×3): qty 1

## 2021-05-04 MED ORDER — ONDANSETRON HCL 4 MG/2ML IJ SOLN
4.0000 mg | Freq: Four times a day (QID) | INTRAMUSCULAR | Status: DC | PRN
  Administered 2021-05-04 – 2021-05-05 (×2): 4 mg via INTRAVENOUS
  Filled 2021-05-04 (×2): qty 2

## 2021-05-04 NOTE — H&P (Signed)
History and Physical    [name] ZOX:[id number] DOB: [birth year] DOA: 05/03/2021  PCP: Quentin Angst, MD  Patient coming from: Home.  Chief Complaint: Pain.  HPI: Timothy Marks is a 31 y.o. male with history of sickle cell anemia presents with generalized body ache typical of his sickle cell pain crisis over the last 24 hours.  Patient has ran out of his OxyContin over the last 1 week.  But he has been taking all other medications including his blood thinner.  Denies any headache visual symptoms or shortness of breath.  ED Course: In the ER patient was given multiple doses of pain relief medications despite which patient was still in pain admitted for sickle cell pain crisis.  COVID test negative.  Chest x-ray unremarkable EKG shows early repolarization changes.  Review of Systems: As per HPI, rest all negative.   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 His smoking use included cigarettes. He has never used smokeless tobacco. He reports that he does not currently use drugs after having used the following drugs: Marijuana. He reports that he does not drink alcohol.  Allergies  Allergen Reactions   Buprenorphine Hcl Hives   Levaquin [Levofloxacin] Itching   Meperidine Rash   Morphine Hives   Morphine And Related Hives   Tramadol Hives   Vancomycin Itching   Zosyn [Piperacillin Sod-Tazobactam So] Itching, Rash and Other (See Comments)    Has taken rocephin in the past    Family History  Problem Relation Age of Onset   Sickle cell trait Mother    Sickle cell trait Father     Prior to Admission medications   Medication Sig Start Date End Date Taking?  Authorizing Provider  folic acid (FOLVITE) 1 MG tablet Take 1 tablet (1 mg total) by mouth daily. 05/07/20  Yes Quentin Angst, MD  gabapentin (NEURONTIN) 300 MG capsule Take 1 capsule (300 mg total) by mouth 3 (three) times daily. 02/02/21  Yes Quentin Angst, MD  hydroxyurea (HYDREA) 500 MG capsule Take 1 capsule (500 mg total) by mouth 2 (two) times daily. May take with food to minimize GI side effects. 05/07/20  Yes Quentin Angst, MD  oxyCODONE-acetaminophen (PERCOCET) 10-325 MG tablet Take 1 tablet by mouth every 6 (six) hours as needed for up to 15 days for pain. 04/30/21 05/15/21 Yes Jegede, Olugbemiga E, MD  OXYCONTIN 20 MG 12 hr tablet Take 1 tablet (20 mg total) by mouth every 12 (twelve) hours. 04/15/21 05/15/21 Yes Quentin Angst, MD  rivaroxaban (XARELTO) 20 MG TABS tablet Take 1 tablet (20 mg total) by mouth daily with supper. 06/24/20  Yes Quentin Angst, MD    Physical Exam: Constitutional: Moderately built and nourished. Vitals:   05/03/21 2230 05/03/21 2300 05/04/21 0000 05/04/21 0129  BP: 123/67 116/71 (!) 108/57 121/72  Pulse: 80 79 77 87  Resp: 19 (!) 21 14 16   Temp:      TempSrc:      SpO2: 96% 97% 96% 97%  Weight:      Height:       Eyes: Anicteric no pallor. ENMT: No discharge  from the ears eyes nose and mouth. Neck: No mass felt.  No neck rigidity. Respiratory: No rhonchi or crepitations. Cardiovascular: S1-S2 heard. Abdomen: Soft nontender bowel sound present. Musculoskeletal: No edema. Skin: No rash Neurologic: Alert awake oriented time place and person.  Moves all extremities. Psychiatric: Appears normal.  Normal affect.   Labs on Admission: I have personally reviewed following labs and imaging studies  CBC: Recent Labs  Lab 05/03/21 2347  WBC 13.8*  NEUTROABS 10.5*  HGB 10.5*  HCT 30.3*  MCV 88.3  PLT 478*   Basic Metabolic Panel: Recent Labs  Lab 05/03/21 2347  NA 133*  K 3.7  CL 101  CO2 25  GLUCOSE 97  BUN 7   CREATININE 0.62  CALCIUM 8.9   GFR: Estimated Creatinine Clearance: 114.8 mL/min (by C-G formula based on SCr of 0.62 mg/dL). Liver Function Tests: Recent Labs  Lab 05/03/21 2347  AST 24  ALT 10  ALKPHOS 92  BILITOT 2.0*  PROT 8.5*  ALBUMIN 4.7   No results for input(s): LIPASE, AMYLASE in the last 168 hours. No results for input(s): AMMONIA in the last 168 hours. Coagulation Profile: No results for input(s): INR, PROTIME in the last 168 hours. Cardiac Enzymes: No results for input(s): CKTOTAL, CKMB, CKMBINDEX, TROPONINI in the last 168 hours. BNP (last 3 results) No results for input(s): PROBNP in the last 8760 hours. HbA1C: No results for input(s): HGBA1C in the last 72 hours. CBG: No results for input(s): GLUCAP in the last 168 hours. Lipid Profile: No results for input(s): CHOL, HDL, LDLCALC, TRIG, CHOLHDL, LDLDIRECT in the last 72 hours. Thyroid Function Tests: No results for input(s): TSH, T4TOTAL, FREET4, T3FREE, THYROIDAB in the last 72 hours. Anemia Panel: Recent Labs    05/03/21 2347  RETICCTPCT 9.3*   Urine analysis:    Component Value Date/Time   COLORURINE YELLOW 01/28/2021 0525   APPEARANCEUR CLEAR 01/28/2021 0525   LABSPEC <1.005 (L) 01/28/2021 0525   PHURINE 5.5 01/28/2021 0525   GLUCOSEU NEGATIVE 01/28/2021 0525   HGBUR SMALL (A) 01/28/2021 0525   BILIRUBINUR NEGATIVE 01/28/2021 0525   BILIRUBINUR small 05/12/2020 1227   KETONESUR 15 (A) 01/28/2021 0525   PROTEINUR NEGATIVE 01/28/2021 0525   UROBILINOGEN 4.0 (A) 05/12/2020 1227   UROBILINOGEN 2.0 (H) 05/01/2017 0955   NITRITE NEGATIVE 01/28/2021 0525   LEUKOCYTESUR TRACE (A) 01/28/2021 0525   Sepsis Labs: @LABRCNTIP (procalcitonin:4,lacticidven:4) ) Recent Results (from the past 240 hour(s))  Resp Panel by RT-PCR (Flu A&B, Covid) Nasopharyngeal Swab     Status: None   Collection Time: 05/04/21  1:22 AM   Specimen: Nasopharyngeal Swab; Nasopharyngeal(NP) swabs in vial transport medium   Result Value Ref Range Status   SARS Coronavirus 2 by RT PCR NEGATIVE NEGATIVE Final    Comment: (NOTE) SARS-CoV-2 target nucleic acids are NOT DETECTED.  The SARS-CoV-2 RNA is generally detectable in upper respiratory specimens during the acute phase of infection. The lowest concentration of SARS-CoV-2 viral copies this assay can detect is 138 copies/mL. A negative result does not preclude SARS-Cov-2 infection and should not be used as the sole basis for treatment or other patient management decisions. A negative result may occur with  improper specimen collection/handling, submission of specimen other than nasopharyngeal swab, presence of viral mutation(s) within the areas targeted by this assay, and inadequate number of viral copies(<138 copies/mL). A negative result must be combined with clinical observations, patient history, and epidemiological information. The expected result is Negative.  Fact Sheet for Patients:  BloggerCourse.com  Fact Sheet for Healthcare Providers:  SeriousBroker.it  This test is no t yet approved or cleared by the [place] FDA and  has been authorized for detection and/or diagnosis of SARS-CoV-2 by FDA under an Emergency Use Authorization (EUA). This EUA will remain  in effect (meaning this test can be used) for the duration of the COVID-19 declaration under Section 564(b)(1) of the Act, 21 U.S.C.section 360bbb-3(b)(1), unless the authorization is terminated  or revoked sooner.       Influenza A by PCR NEGATIVE NEGATIVE Final   Influenza B by PCR NEGATIVE NEGATIVE Final    Comment: (NOTE) The Xpert Xpress SARS-CoV-2/FLU/RSV plus assay is intended as an aid in the diagnosis of influenza from Nasopharyngeal swab specimens and should not be used as a sole basis for treatment. Nasal washings and aspirates are unacceptable for Xpert Xpress SARS-CoV-2/FLU/RSV testing.  Fact Sheet for  Patients: BloggerCourse.com  Fact Sheet for Healthcare Providers: SeriousBroker.it  This test is not yet approved or cleared by the [place] FDA and has been authorized for detection and/or diagnosis of SARS-CoV-2 by FDA under an Emergency Use Authorization (EUA). This EUA will remain in effect (meaning this test can be used) for the duration of the COVID-19 declaration under Section 564(b)(1) of the Act, 21 U.S.C. section 360bbb-3(b)(1), unless the authorization is terminated or revoked.  Performed at [facility], [address] [place], [place] [address]      Radiological Exams on Admission: DG Chest Portable 1 View  Result Date: 05/03/2021 CLINICAL DATA:  Sickle cell crisis, chest pain. EXAM: PORTABLE CHEST 1 VIEW COMPARISON:  Chest x-ray 04/12/2021. FINDINGS: The heart size and mediastinal contours are within normal limits. Both lungs are clear. The visualized skeletal structures are unremarkable. IMPRESSION: No active disease. Electronically Signed   By: Darliss Cheney M.D.   On: 05/03/2021 21:53    EKG: Independently reviewed.  Normal sinus rhythm with early repolarization changes.  Assessment/Plan Principal Problem:   Sickle cell pain crisis (HCC) Active Problems:   Sickle cell anemia with pain (HCC)    Sickle cell pain crisis placed patient on Dilaudid PCA weight-based.  IV fluids Toradol.  Patient had ran out of his OxyContin for last 1 week but patient states he does not want to take it because it causes severe itching.  Will await for further recommendations from sickle cell team.  Takes hydroxyurea gabapentin and folic acid. History of DVT on Xarelto. Anemia secondary to sickle cell disease follow CBC.  Since patient has sickle cell anemia with pain crisis requiring Dilaudid PCA will need inpatient status.   DVT prophylaxis: Xarelto. Code Status: Full code. Family Communication: Discussed  with patient. Disposition Plan: Home. Consults called: None. Admission status: Inpatient.   [name] MD [facility] Pager [phone].  If 7PM-7AM, please contact night-coverage [email or web address] Password [email or web address]  05/04/2021, 2:28 AM

## 2021-05-04 NOTE — Progress Notes (Signed)
Timothy Marks is a 31 year old male with a medical history significant for sickle cell disease, chronic pain syndrome, opiate dependence and tolerance, and chronic marijuana use that was admitted overnight for sickle cell pain crisis.  Patient states that pain intensity is 8/10 despite IV Dilaudid PCA.  He denies any headache, chest pain, urinary symptoms, nausea, vomiting, or diarrhea. ? ?Care plan: ?IV Dilaudid PCA settings adjusted to 0.5 mg, 10-minute lockout, and 3 mg/h ?At Toradol 15 mg IV every 6 hours ?MS Contin 15 mg every 12 hours ? ? ?We will reassess in a.m. ? ? ?[name]  APRN, MSN, FNP-C ?[facility] [place] ?[facility] ?[address]  ?[place], [place] [address] ?[phone] ? ?

## 2021-05-05 DIAGNOSIS — D5700 Hb-SS disease with crisis, unspecified: Secondary | ICD-10-CM | POA: Diagnosis not present

## 2021-05-05 MED ORDER — OXYCODONE-ACETAMINOPHEN 5-325 MG PO TABS
1.0000 | ORAL_TABLET | Freq: Four times a day (QID) | ORAL | Status: DC | PRN
  Administered 2021-05-05 – 2021-05-07 (×3): 1 via ORAL
  Filled 2021-05-05 (×3): qty 1

## 2021-05-05 MED ORDER — KETOROLAC TROMETHAMINE 15 MG/ML IJ SOLN
15.0000 mg | Freq: Four times a day (QID) | INTRAMUSCULAR | Status: DC
  Administered 2021-05-05 – 2021-05-07 (×9): 15 mg via INTRAVENOUS
  Filled 2021-05-05 (×9): qty 1

## 2021-05-05 MED ORDER — OXYCODONE HCL 5 MG PO TABS
5.0000 mg | ORAL_TABLET | Freq: Four times a day (QID) | ORAL | Status: DC | PRN
  Administered 2021-05-05 – 2021-05-07 (×2): 5 mg via ORAL
  Filled 2021-05-05 (×2): qty 1

## 2021-05-05 NOTE — Progress Notes (Signed)
Subjective: ?Timothy Marks is a 31 year old male with a medical history significant for sickle cell disease, chronic pain syndrome, opiate dependence and tolerance, history of anemia of chronic disease, history of PE on Xarelto, and history of major depression was admitted for sickle cell pain crisis. ? ?Patient reports ongoing pain.  Pain is primarily to low back and is rated 10/10.  He characterizes pain as constant and throbbing.  Patient is severely agitated at this time, difficult to obtain history. ? ?Objective: ? ?Vital signs in last 24 hours: ? ?Vitals:  ? 05/05/21 0443 05/05/21 0750 05/05/21 0951 05/05/21 1059  ?BP: 127/79  126/65   ?Pulse: 94  78   ?Resp: '18 14 14 14  '$ ?Temp: 98.6 ?F (37 ?C)  98 ?F (36.7 ?C)   ?TempSrc: Oral  Oral   ?SpO2: 93% 93% 98% 100%  ?Weight:      ?Height:      ? ? ?Intake/Output from previous day: ? ? ?Intake/Output Summary (Last 24 hours) at 05/05/2021 1335 ?Last data filed at 05/05/2021 0300 ?Gross per 24 hour  ?Intake 925.89 ml  ?Output 850 ml  ?Net 75.89 ml  ? ? ?Physical Exam: ?General: Alert, awake, oriented x3, in no acute distress.  ?HEENT: [place]/AT PEERL, EOMI ?Neck: Trachea midline,  no masses, no thyromegal,y no JVD, no carotid bruit ?OROPHARYNX:  Moist, No exudate/ erythema/lesions.  ?Heart: Regular rate and rhythm, without murmurs, rubs, gallops, PMI non-displaced, no heaves or thrills on palpation.  ?Lungs: Clear to auscultation, no wheezing or rhonchi noted. No increased vocal fremitus resonant to percussion  ?Abdomen: Soft, nontender, nondistended, positive bowel sounds, no masses no hepatosplenomegaly noted.Marland Kitchen  ?Neuro: No focal neurological deficits noted cranial nerves II through XII grossly intact. DTRs 2+ bilaterally upper and lower extremities. Strength 5 out of 5 in bilateral upper and lower extremities. ?Musculoskeletal: No warm swelling or erythema around joints, no spinal tenderness noted. ?Psychiatric: Patient alert and oriented x3, good insight and cognition,  good recent to remote recall. ?Lymph node survey: No cervical axillary or inguinal lymphadenopathy noted. ? ?Lab Results: ? ?Basic Metabolic Panel: ?   ?Component Value Date/Time  ? NA 133 (L) 05/03/2021 2347  ? NA 139 04/22/2021 1106  ? K 3.7 05/03/2021 2347  ? CL 101 05/03/2021 2347  ? CO2 25 05/03/2021 2347  ? BUN 7 05/03/2021 2347  ? BUN 6 04/22/2021 1106  ? CREATININE 0.62 05/03/2021 2347  ? GLUCOSE 97 05/03/2021 2347  ? CALCIUM 8.9 05/03/2021 2347  ? ?CBC: ?   ?Component Value Date/Time  ? WBC 13.8 (H) 05/03/2021 2347  ? HGB 10.5 (L) 05/03/2021 2347  ? HGB 9.7 (L) 04/22/2021 1106  ? HCT 30.3 (L) 05/03/2021 2347  ? HCT 30.2 (L) 04/22/2021 1106  ? PLT 478 (H) 05/03/2021 2347  ? PLT 529 (H) 04/22/2021 1106  ? MCV 88.3 05/03/2021 2347  ? MCV 90 04/22/2021 1106  ? NEUTROABS 10.5 (H) 05/03/2021 2347  ? NEUTROABS 5.5 04/22/2021 1106  ? LYMPHSABS 2.1 05/03/2021 2347  ? LYMPHSABS 2.0 04/22/2021 1106  ? MONOABS 0.9 05/03/2021 2347  ? EOSABS 0.2 05/03/2021 2347  ? EOSABS 0.2 04/22/2021 1106  ? BASOSABS 0.0 05/03/2021 2347  ? BASOSABS 0.0 04/22/2021 1106  ? ? ?Recent Results (from the past 240 hour(s))  ?Resp Panel by RT-PCR (Flu A&B, Covid) Nasopharyngeal Swab     Status: None  ? Collection Time: 05/04/21  1:22 AM  ? Specimen: Nasopharyngeal Swab; Nasopharyngeal(NP) swabs in vial transport medium  ?Result Value Ref  Range Status  ? SARS Coronavirus 2 by RT PCR NEGATIVE NEGATIVE Final  ?  Comment: (NOTE) ?SARS-CoV-2 target nucleic acids are NOT DETECTED. ? ?The SARS-CoV-2 RNA is generally detectable in upper respiratory ?specimens during the acute phase of infection. The lowest ?concentration of SARS-CoV-2 viral copies this assay can detect is ?138 copies/mL. A negative result does not preclude SARS-Cov-2 ?infection and should not be used as the sole basis for treatment or ?other patient management decisions. A negative result may occur with  ?improper specimen collection/handling, submission of specimen other ?than  nasopharyngeal swab, presence of viral mutation(s) within the ?areas targeted by this assay, and inadequate number of viral ?copies(<138 copies/mL). A negative result must be combined with ?clinical observations, patient history, and epidemiological ?information. The expected result is Negative. ? ?Fact Sheet for Patients:  ?EntrepreneurPulse.com.au ? ?Fact Sheet for Healthcare Providers:  ?IncredibleEmployment.be ? ?This test is no t yet approved or cleared by the [place] FDA and  ?has been authorized for detection and/or diagnosis of SARS-CoV-2 by ?FDA under an Emergency Use Authorization (EUA). This EUA will remain  ?in effect (meaning this test can be used) for the duration of the ?COVID-19 declaration under Section 564(b)(1) of the Act, 21 ?U.S.C.section 360bbb-3(b)(1), unless the authorization is terminated  ?or revoked sooner.  ? ? ?  ? Influenza A by PCR NEGATIVE NEGATIVE Final  ? Influenza B by PCR NEGATIVE NEGATIVE Final  ?  Comment: (NOTE) ?The Xpert Xpress SARS-CoV-2/FLU/RSV plus assay is intended as an aid ?in the diagnosis of influenza from Nasopharyngeal swab specimens and ?should not be used as a sole basis for treatment. Nasal washings and ?aspirates are unacceptable for Xpert Xpress SARS-CoV-2/FLU/RSV ?testing. ? ?Fact Sheet for Patients: ?EntrepreneurPulse.com.au ? ?Fact Sheet for Healthcare Providers: ?IncredibleEmployment.be ? ?This test is not yet approved or cleared by the [place] FDA and ?has been authorized for detection and/or diagnosis of SARS-CoV-2 by ?FDA under an Emergency Use Authorization (EUA). This EUA will remain ?in effect (meaning this test can be used) for the duration of the ?COVID-19 declaration under Section 564(b)(1) of the Act, 21 U.S.C. ?section 360bbb-3(b)(1), unless the authorization is terminated or ?revoked. ? ?Performed at [facility], [address] ?[place], [place] [address] ?  ? ? ?Studies/Results: ?DG Chest Portable 1 View ? ?Result Date: 05/03/2021 ?CLINICAL DATA:  Sickle cell crisis, chest pain. EXAM: PORTABLE CHEST 1 VIEW COMPARISON:  Chest x-ray 04/12/2021. FINDINGS: The heart size and mediastinal contours are within normal limits. Both lungs are clear. The visualized skeletal structures are unremarkable. IMPRESSION: No active disease. Electronically Signed   By: Ronney Asters M.D.   On: 05/03/2021 21:53   ? ?Medications: ?Scheduled Meds: ? folic acid  1 mg Oral Daily  ? gabapentin  300 mg Oral TID  ? HYDROmorphone   Intravenous Q4H  ? hydroxyurea  500 mg Oral BID  ? ketorolac  15 mg Intravenous Q6H  ? morphine  15 mg Oral Q12H  ? rivaroxaban  20 mg Oral Q supper  ? ?Continuous Infusions: ?PRN Meds:.naloxone **AND** sodium chloride flush, ondansetron (ZOFRAN) IV, oxyCODONE-acetaminophen **AND** oxyCODONE ? ?Consultants: ?none ? ?Procedures: ?none ? ?Antibiotics: ?none ? ?Assessment/Plan: ?Principal Problem: ?  Sickle cell pain crisis ([place]) ?Active Problems: ?  Sickle cell anemia with pain ([place]) ? ?Sickle cell disease with pain crisis: ?Decrease IV fluids to KVO ?Continue IV Dilaudid PCA without any changes ?Toradol 15 mg IV every 6 hours ?Patient was previously on OxyContin at home, he  says that he discussed discontinuing this medication with his PCP due to increased itching.  We will start MS Contin 15 mg every 12 hours per patient's PCP. ?Monitor vital signs closely, reevaluate pain scale regularly, and supplemental oxygen as needed ? ?Anemia of chronic disease: ?Hemoglobin is stable and consistent with patient's baseline.  There is no clinical indication for blood transfusion at this time.  Monitor closely.  Labs in AM. ? ?Chronic pain syndrome: ?Continue home medications ? ?Major depression: ?Patient is severely agitated.  Affect anxious.  He is currently not on antipsychotics, anxiolytics or antidepressants at this time.  He is also not under the  care of psychiatry as an outpatient.  He denies any visual or auditory hallucinations.  He also denies any suicidal homicidal intentions. ? ? ?Code Status: Full Code ?Family Communication: N/A ?Disposition Plan: Not yet ready

## 2021-05-06 DIAGNOSIS — D5700 Hb-SS disease with crisis, unspecified: Secondary | ICD-10-CM | POA: Diagnosis not present

## 2021-05-06 LAB — TYPE AND SCREEN
ABO/RH(D): A POS
Antibody Screen: NEGATIVE

## 2021-05-06 LAB — BASIC METABOLIC PANEL
Anion gap: 8 (ref 5–15)
BUN: 18 mg/dL (ref 6–20)
CO2: 25 mmol/L (ref 22–32)
Calcium: 9 mg/dL (ref 8.9–10.3)
Chloride: 103 mmol/L (ref 98–111)
Creatinine, Ser: 0.63 mg/dL (ref 0.61–1.24)
GFR, Estimated: >60 mL/min (ref >60)
Glucose, Bld: 91 mg/dL (ref 70–99)
Potassium: 4.3 mmol/L (ref 3.5–5.1)
Sodium: 136 mmol/L (ref 135–145)

## 2021-05-06 LAB — CBC
HCT: 23.4 % — ABNORMAL LOW (ref 39.0–52.0)
Hemoglobin: 8.2 g/dL — ABNORMAL LOW (ref 13.0–17.0)
MCH: 30.1 pg (ref 26.0–34.0)
MCHC: 35 g/dL (ref 30.0–36.0)
MCV: 86 fL (ref 80.0–100.0)
Platelets: 453 10*3/uL — ABNORMAL HIGH (ref 150–400)
RBC: 2.72 MIL/uL — ABNORMAL LOW (ref 4.22–5.81)
RDW: 20.7 % — ABNORMAL HIGH (ref 11.5–15.5)
WBC: 10.7 10*3/uL — ABNORMAL HIGH (ref 4.0–10.5)
nRBC: 1.3 % — ABNORMAL HIGH (ref 0.0–0.2)

## 2021-05-06 MED ORDER — HYDROMORPHONE HCL 1 MG/ML IJ SOLN: 1.0000 mg | INTRAMUSCULAR | Status: DC | PRN

## 2021-05-06 MED ORDER — DIPHENHYDRAMINE HCL 25 MG PO CAPS: 25.0000 mg | ORAL_CAPSULE | ORAL | Status: DC | PRN

## 2021-05-06 MED ORDER — HYDROMORPHONE 1 MG/ML IV SOLN
INTRAVENOUS | Status: DC
  Administered 2021-05-06: 4 mg via INTRAVENOUS
  Administered 2021-05-06: 2 mg via INTRAVENOUS
  Administered 2021-05-07 (×2): 1 mg via INTRAVENOUS
  Administered 2021-05-07: 2 mg via INTRAVENOUS
  Administered 2021-05-07: 1.5 mg via INTRAVENOUS
  Administered 2021-05-07: 0.5 mg via INTRAVENOUS

## 2021-05-06 MED ORDER — SODIUM CHLORIDE 0.9% FLUSH: 9.0000 mL | INTRAVENOUS | Status: DC | PRN

## 2021-05-06 MED ORDER — ONDANSETRON HCL 4 MG/2ML IJ SOLN: 4.0000 mg | Freq: Four times a day (QID) | INTRAMUSCULAR | Status: DC | PRN

## 2021-05-06 MED ORDER — WHITE PETROLATUM EX OINT
TOPICAL_OINTMENT | CUTANEOUS | Status: DC | PRN
  Filled 2021-05-06: qty 5

## 2021-05-06 MED ORDER — NALOXONE HCL 0.4 MG/ML IJ SOLN: 0.4000 mg | INTRAMUSCULAR | Status: DC | PRN

## 2021-05-06 MED ORDER — OLANZAPINE 5 MG PO TABS
5.0000 mg | ORAL_TABLET | Freq: Every day | ORAL | Status: DC
  Administered 2021-05-06 – 2021-05-07 (×2): 5 mg via ORAL
  Filled 2021-05-06 (×2): qty 1

## 2021-05-06 NOTE — Progress Notes (Signed)
Subjective: ?Timothy Marks is a 31 year old male with a medical history significant for sickle cell disease, chronic pain syndrome, opiate dependence and tolerance, history of anemia of chronic disease, history of PE on Xarelto, and history of major depression was admitted for sickle cell pain crisis. ? ?Patient reports that pain has improved some overnight.  He rates his pain as 7/10.  While assessing patient, he began to yell "stop it Timothy Marks, give me my 50 cent pieces". There was no one else in the room at the time. When questioned about outburst, patient says that he has been having a reoccurring dream.  He denies visual or auditory hallucinations at this time. ? ?He also denies any headache, shortness of breath, chest pain, urinary symptoms, nausea, vomiting, or diarrhea. ?Objective: ? ?Vital signs in last 24 hours: ? ?Vitals:  ? 05/06/21 0800 05/06/21 1108 05/06/21 1230 05/06/21 1645  ?BP: 119/78  120/71 114/67  ?Pulse: 77  82 98  ?Resp: '13 15 12 18  '$ ?Temp: 97.9 ?F (36.6 ?C)  98 ?F (36.7 ?C)   ?TempSrc: Oral  Oral   ?SpO2: 95% 98% 96% 94%  ?Weight:      ?Height:      ? ? ?Intake/Output from previous day: ? ? ?Intake/Output Summary (Last 24 hours) at 05/06/2021 1747 ?Last data filed at 05/06/2021 0830 ?Gross per 24 hour  ?Intake 237 ml  ?Output 950 ml  ?Net -713 ml  ? ? ?Physical Exam: ?General: Alert, awake, oriented x3, in no acute distress.  ?HEENT: [place]/AT PEERL, EOMI ?Neck: Trachea midline,  no masses, no thyromegal,y no JVD, no carotid bruit ?OROPHARYNX:  Moist, No exudate/ erythema/lesions.  ?Heart: Regular rate and rhythm, without murmurs, rubs, gallops, PMI non-displaced, no heaves or thrills on palpation.  ?Lungs: Clear to auscultation, no wheezing or rhonchi noted. No increased vocal fremitus resonant to percussion  ?Abdomen: Soft, nontender, nondistended, positive bowel sounds, no masses no hepatosplenomegaly noted.Marland Kitchen  ?Neuro: No focal neurological deficits noted cranial nerves II through XII grossly  intact. DTRs 2+ bilaterally upper and lower extremities. Strength 5 out of 5 in bilateral upper and lower extremities. ?Musculoskeletal: No warm swelling or erythema around joints, no spinal tenderness noted. ?Psychiatric: Patient alert and oriented x3, good insight and cognition, good recent to remote recall. ?Lymph node survey: No cervical axillary or inguinal lymphadenopathy noted. ? ?Lab Results: ? ?Basic Metabolic Panel: ?   ?Component Value Date/Time  ? NA 136 05/06/2021 0806  ? NA 139 04/22/2021 1106  ? K 4.3 05/06/2021 0806  ? CL 103 05/06/2021 0806  ? CO2 25 05/06/2021 0806  ? BUN 18 05/06/2021 0806  ? BUN 6 04/22/2021 1106  ? CREATININE 0.63 05/06/2021 0806  ? GLUCOSE 91 05/06/2021 0806  ? CALCIUM 9.0 05/06/2021 0806  ? ?CBC: ?   ?Component Value Date/Time  ? WBC 10.7 (H) 05/06/2021 0806  ? HGB 8.2 (L) 05/06/2021 0806  ? HGB 9.7 (L) 04/22/2021 1106  ? HCT 23.4 (L) 05/06/2021 0806  ? HCT 30.2 (L) 04/22/2021 1106  ? PLT 453 (H) 05/06/2021 0806  ? PLT 529 (H) 04/22/2021 1106  ? MCV 86.0 05/06/2021 0806  ? MCV 90 04/22/2021 1106  ? NEUTROABS 10.5 (H) 05/03/2021 2347  ? NEUTROABS 5.5 04/22/2021 1106  ? LYMPHSABS 2.1 05/03/2021 2347  ? LYMPHSABS 2.0 04/22/2021 1106  ? MONOABS 0.9 05/03/2021 2347  ? EOSABS 0.2 05/03/2021 2347  ? EOSABS 0.2 04/22/2021 1106  ? BASOSABS 0.0 05/03/2021 2347  ? BASOSABS 0.0 04/22/2021 1106  ? ? ?  Recent Results (from the past 240 hour(s))  ?Resp Panel by RT-PCR (Flu A&B, Covid) Nasopharyngeal Swab     Status: None  ? Collection Time: 05/04/21  1:22 AM  ? Specimen: Nasopharyngeal Swab; Nasopharyngeal(NP) swabs in vial transport medium  ?Result Value Ref Range Status  ? SARS Coronavirus 2 by RT PCR NEGATIVE NEGATIVE Final  ?  Comment: (NOTE) ?SARS-CoV-2 target nucleic acids are NOT DETECTED. ? ?The SARS-CoV-2 RNA is generally detectable in upper respiratory ?specimens during the acute phase of infection. The lowest ?concentration of SARS-CoV-2 viral copies this assay can detect is ?138  copies/mL. A negative result does not preclude SARS-Cov-2 ?infection and should not be used as the sole basis for treatment or ?other patient management decisions. A negative result may occur with  ?improper specimen collection/handling, submission of specimen other ?than nasopharyngeal swab, presence of viral mutation(s) within the ?areas targeted by this assay, and inadequate number of viral ?copies(<138 copies/mL). A negative result must be combined with ?clinical observations, patient history, and epidemiological ?information. The expected result is Negative. ? ?Fact Sheet for Patients:  ?EntrepreneurPulse.com.au ? ?Fact Sheet for Healthcare Providers:  ?IncredibleEmployment.be ? ?This test is no t yet approved or cleared by the [place] FDA and  ?has been authorized for detection and/or diagnosis of SARS-CoV-2 by ?FDA under an Emergency Use Authorization (EUA). This EUA will remain  ?in effect (meaning this test can be used) for the duration of the ?COVID-19 declaration under Section 564(b)(1) of the Act, 21 ?U.S.C.section 360bbb-3(b)(1), unless the authorization is terminated  ?or revoked sooner.  ? ? ?  ? Influenza A by PCR NEGATIVE NEGATIVE Final  ? Influenza B by PCR NEGATIVE NEGATIVE Final  ?  Comment: (NOTE) ?The Xpert Xpress SARS-CoV-2/FLU/RSV plus assay is intended as an aid ?in the diagnosis of influenza from Nasopharyngeal swab specimens and ?should not be used as a sole basis for treatment. Nasal washings and ?aspirates are unacceptable for Xpert Xpress SARS-CoV-2/FLU/RSV ?testing. ? ?Fact Sheet for Patients: ?EntrepreneurPulse.com.au ? ?Fact Sheet for Healthcare Providers: ?IncredibleEmployment.be ? ?This test is not yet approved or cleared by the [place] FDA and ?has been authorized for detection and/or diagnosis of SARS-CoV-2 by ?FDA under an Emergency Use Authorization (EUA). This EUA will remain ?in effect (meaning  this test can be used) for the duration of the ?COVID-19 declaration under Section 564(b)(1) of the Act, 21 U.S.C. ?section 360bbb-3(b)(1), unless the authorization is terminated or ?revoked. ? ?Performed at [facility], [address] ?[place], [place] [address] ?  ? ? ?Studies/Results: ?No results found. ? ?Medications: ?Scheduled Meds: ? folic acid  1 mg Oral Daily  ? gabapentin  300 mg Oral TID  ? HYDROmorphone   Intravenous Q4H  ? hydroxyurea  500 mg Oral BID  ? ketorolac  15 mg Intravenous Q6H  ? morphine  15 mg Oral Q12H  ? OLANZapine  5 mg Oral Daily  ? rivaroxaban  20 mg Oral Q supper  ? ?Continuous Infusions: ?PRN Meds:.diphenhydrAMINE, naloxone **AND** sodium chloride flush, ondansetron (ZOFRAN) IV, oxyCODONE-acetaminophen **AND** oxyCODONE, white petrolatum ? ?Consultants: ?none ? ?Procedures: ?none ? ?Antibiotics: ?none ? ?Assessment/Plan: ?Principal Problem: ?  Sickle cell pain crisis ([place]) ?Active Problems: ?  Sickle cell anemia with pain ([place]) ?  Agitation ?  Other chronic pain ?  History of DVT (deep vein thrombosis) ?  Socially inappropriate behavior ? ?Sickle cell disease with pain crisis: ?Continue IV Dilaudid PCA, will start weaning today ?Toradol 15 mg IV every 6  hours ?Continue MS Contin 15 mg every 12 hours ?Monitor vital signs closely, reevaluate pain scale regularly, and supplemental oxygen as needed ? ?Anemia of chronic disease: ?Hemoglobin is stable and consistent with patient's baseline.  There is no clinical indication for blood transfusion at this time.  Monitor closely.  Labs in AM. ? ?Chronic pain syndrome: ?Continue home medications ? ?Major depression: ?Patient is severely agitated.  Affect anxious.  He is currently not on antipsychotics, anxiolytics or antidepressants at this time.  He is also not under the care of psychiatry as an outpatient.  He denies any visual or auditory hallucinations.  He also denies any suicidal homicidal intentions. ? ?Possible  visual/auditory hallucinations: ?Patient is having frequent outbursts witnessed by Probation officer and staff.  We will start Zyprexa 5 mg daily.  Consider psychiatry consult during admission. ? ? ?Code Status: Full Co

## 2021-05-06 NOTE — Progress Notes (Signed)
?  Transition of Care (TOC) Screening Note ? ? ?Patient Details  ?Name: Timothy Marks ?Date of Birth: [birth year] ? ? ?Transition of Care (TOC) CM/SW Contact:    ?Jesyka Slaght, Marjie Skiff, RN ?Phone Number: ?05/06/2021, 1:42 PM ? ? ? ?Transition of Care Department [facility]) has reviewed patient and no TOC needs have been identified at this time. We will continue to monitor patient advancement through interdisciplinary progression rounds. If new patient transition needs arise, please place a TOC consult. ?  ?

## 2021-05-06 NOTE — Progress Notes (Signed)
Pt educated on importance of keeping his EtCO2 monitor tubing on and continuous pulse ox on while on PCA to for his safety. Pt states "I know what it for and I don't need you to tell me". Despite this verbalizing the understanding, patient demonstrates lack of understanding by removing his EtCO2 monitor and continuous pulse ox monitoring frequently throughout the shift. At time when reminded to keep it on, patient became very irritable and angry, using loud voice and verbally aggressive. Pt at time asked nurse "get out of my room". This care RN once again requested patient to keep the equipment on for monitoring while on PCA. ? ?Charge nurse aware. Fellow coworker attempted to communicate the education to patient.  ?

## 2021-05-06 NOTE — Plan of Care (Signed)
?  Problem: Health Behavior/Discharge Planning: Goal: Ability to manage health-related needs will improve Outcome: Progressing   Problem: Clinical Measurements: Goal: Ability to maintain clinical measurements within normal limits will improve Outcome: Progressing Goal: Will remain free from infection Outcome: Progressing Goal: Cardiovascular complication will be avoided Outcome: Progressing   Problem: Activity: Goal: Risk for activity intolerance will decrease Outcome: Progressing   Problem: Nutrition: Goal: Adequate nutrition will be maintained Outcome: Progressing   Problem: Elimination: Goal: Will not experience complications related to bowel motility Outcome: Progressing   

## 2021-05-07 DIAGNOSIS — D5700 Hb-SS disease with crisis, unspecified: Secondary | ICD-10-CM | POA: Diagnosis not present

## 2021-05-07 MED ORDER — OLANZAPINE 5 MG PO TABS: 5.0000 mg | ORAL_TABLET | Freq: Every day | ORAL | 2 refills | Status: DC

## 2021-05-07 NOTE — Plan of Care (Signed)
?  Problem: Education: ?Goal: Knowledge of General Education information will improve ?Description: Including pain rating scale, medication(s)/side effects and non-pharmacologic comfort measures ?05/07/2021 1259 by Anastasio Champion, RN ?Outcome: Adequate for Discharge ?05/07/2021 1259 by Anastasio Champion, RN ?Outcome: Progressing ?  ?Problem: Health Behavior/Discharge Planning: ?Goal: Ability to manage health-related needs will improve ?05/07/2021 1259 by Anastasio Champion, RN ?Outcome: Adequate for Discharge ?05/07/2021 1259 by Anastasio Champion, RN ?Outcome: Progressing ?  ?Problem: Clinical Measurements: ?Goal: Ability to maintain clinical measurements within normal limits will improve ?05/07/2021 1259 by Anastasio Champion, RN ?Outcome: Adequate for Discharge ?05/07/2021 1259 by Anastasio Champion, RN ?Outcome: Progressing ?Goal: Will remain free from infection ?05/07/2021 1259 by Anastasio Champion, RN ?Outcome: Adequate for Discharge ?05/07/2021 1259 by Anastasio Champion, RN ?Outcome: Progressing ?Goal: Diagnostic test results will improve ?05/07/2021 1259 by Anastasio Champion, RN ?Outcome: Adequate for Discharge ?05/07/2021 1259 by Anastasio Champion, RN ?Outcome: Progressing ?Goal: Respiratory complications will improve ?05/07/2021 1259 by Anastasio Champion, RN ?Outcome: Adequate for Discharge ?05/07/2021 1259 by Anastasio Champion, RN ?Outcome: Progressing ?Goal: Cardiovascular complication will be avoided ?05/07/2021 1259 by Anastasio Champion, RN ?Outcome: Adequate for Discharge ?05/07/2021 1259 by Anastasio Champion, RN ?Outcome: Progressing ?  ?Problem: Activity: ?Goal: Risk for activity intolerance will decrease ?05/07/2021 1259 by Anastasio Champion, RN ?Outcome: Adequate for Discharge ?05/07/2021 1259 by Anastasio Champion, RN ?Outcome: Progressing ?  ?Problem: Nutrition: ?Goal: Adequate nutrition will be maintained ?05/07/2021 1259 by Anastasio Champion, RN ?Outcome: Adequate for Discharge ?05/07/2021 1259 by Anastasio Champion, RN ?Outcome: Progressing ?   ?Problem: Coping: ?Goal: Level of anxiety will decrease ?05/07/2021 1259 by Anastasio Champion, RN ?Outcome: Adequate for Discharge ?05/07/2021 1259 by Anastasio Champion, RN ?Outcome: Progressing ?  ?Problem: Elimination: ?Goal: Will not experience complications related to bowel motility ?05/07/2021 1259 by Anastasio Champion, RN ?Outcome: Adequate for Discharge ?05/07/2021 1259 by Anastasio Champion, RN ?Outcome: Progressing ?Goal: Will not experience complications related to urinary retention ?05/07/2021 1259 by Anastasio Champion, RN ?Outcome: Adequate for Discharge ?05/07/2021 1259 by Anastasio Champion, RN ?Outcome: Progressing ?  ?Problem: Pain Managment: ?Goal: General experience of comfort will improve ?05/07/2021 1259 by Anastasio Champion, RN ?Outcome: Adequate for Discharge ?05/07/2021 1259 by Anastasio Champion, RN ?Outcome: Progressing ?  ?Problem: Safety: ?Goal: Ability to remain free from injury will improve ?05/07/2021 1259 by Anastasio Champion, RN ?Outcome: Adequate for Discharge ?05/07/2021 1259 by Anastasio Champion, RN ?Outcome: Progressing ?  ?Problem: Skin Integrity: ?Goal: Risk for impaired skin integrity will decrease ?05/07/2021 1259 by Anastasio Champion, RN ?Outcome: Adequate for Discharge ?05/07/2021 1259 by Anastasio Champion, RN ?Outcome: Progressing ?  ?

## 2021-05-07 NOTE — Progress Notes (Incomplete)
Subjective: ?Timothy Marks is a 31 year old male with a medical history significant for sickle cell disease, chronic pain syndrome, opiate dependence and tolerance, history of anemia of chronic disease, history of PE on Xarelto, and history of major depression was admitted for sickle cell pain crisis. ? ?Patient reports that pain has improved some overnight.  He rates his pain as 7/10.  While assessing patient, he began to yell "stop it [name], give me my 50 cent pieces". There was no one else in the room at the time. When questioned about outburst, patient says that he has been having a reoccurring dream.  He denies visual or auditory hallucinations at this time. ? ?He also denies any headache, shortness of breath, chest pain, urinary symptoms, nausea, vomiting, or diarrhea. ?Objective: ? ?Vital signs in last 24 hours: ? ?Vitals:  ? 05/07/21 0510 05/07/21 0848 05/07/21 1121 05/07/21 1218  ?BP: (!) 111/58  120/66   ?Pulse: 88  93   ?Resp: 16  15   ?Temp: 98.4 ?F (36.9 ?C)  98.7 ?F (37.1 ?C)   ?TempSrc: Oral  Oral   ?SpO2: 94% 95% 95% 100%  ?Weight:      ?Height:      ? ? ?Intake/Output from previous day: ? ? ?Intake/Output Summary (Last 24 hours) at 05/07/2021 1252 ?Last data filed at 05/07/2021 0044 ?Gross per 24 hour  ?Intake --  ?Output 500 ml  ?Net -500 ml  ? ? ?Physical Exam: ?General: Alert, awake, oriented x3, in no acute distress.  ?HEENT: [place]/AT PEERL, EOMI ?Neck: Trachea midline,  no masses, no thyromegal,y no JVD, no carotid bruit ?OROPHARYNX:  Moist, No exudate/ erythema/lesions.  ?Heart: Regular rate and rhythm, without murmurs, rubs, gallops, PMI non-displaced, no heaves or thrills on palpation.  ?Lungs: Clear to auscultation, no wheezing or rhonchi noted. No increased vocal fremitus resonant to percussion  ?Abdomen: Soft, nontender, nondistended, positive bowel sounds, no masses no hepatosplenomegaly noted.Marland Kitchen  ?Neuro: No focal neurological deficits noted cranial nerves II through XII grossly intact.  DTRs 2+ bilaterally upper and lower extremities. Strength 5 out of 5 in bilateral upper and lower extremities. ?Musculoskeletal: No warm swelling or erythema around joints, no spinal tenderness noted. ?Psychiatric: Patient alert and oriented x3, good insight and cognition, good recent to remote recall. ?Lymph node survey: No cervical axillary or inguinal lymphadenopathy noted. ? ?Lab Results: ? ?Basic Metabolic Panel: ?   ?Component Value Date/Time  ? NA 136 05/06/2021 0806  ? NA 139 04/22/2021 1106  ? K 4.3 05/06/2021 0806  ? CL 103 05/06/2021 0806  ? CO2 25 05/06/2021 0806  ? BUN 18 05/06/2021 0806  ? BUN 6 04/22/2021 1106  ? CREATININE 0.63 05/06/2021 0806  ? GLUCOSE 91 05/06/2021 0806  ? CALCIUM 9.0 05/06/2021 0806  ? ?CBC: ?   ?Component Value Date/Time  ? WBC 10.7 (H) 05/06/2021 0806  ? HGB 8.2 (L) 05/06/2021 0806  ? HGB 9.7 (L) 04/22/2021 1106  ? HCT 23.4 (L) 05/06/2021 0806  ? HCT 30.2 (L) 04/22/2021 1106  ? PLT 453 (H) 05/06/2021 0806  ? PLT 529 (H) 04/22/2021 1106  ? MCV 86.0 05/06/2021 0806  ? MCV 90 04/22/2021 1106  ? NEUTROABS 10.5 (H) 05/03/2021 2347  ? NEUTROABS 5.5 04/22/2021 1106  ? LYMPHSABS 2.1 05/03/2021 2347  ? LYMPHSABS 2.0 04/22/2021 1106  ? MONOABS 0.9 05/03/2021 2347  ? EOSABS 0.2 05/03/2021 2347  ? EOSABS 0.2 04/22/2021 1106  ? BASOSABS 0.0 05/03/2021 2347  ? BASOSABS 0.0 04/22/2021 1106  ? ? ?  Recent Results (from the past 240 hour(s))  ?Resp Panel by RT-PCR (Flu A&B, Covid) Nasopharyngeal Swab     Status: None  ? Collection Time: 05/04/21  1:22 AM  ? Specimen: Nasopharyngeal Swab; Nasopharyngeal(NP) swabs in vial transport medium  ?Result Value Ref Range Status  ? SARS Coronavirus 2 by RT PCR NEGATIVE NEGATIVE Final  ?  Comment: (NOTE) ?SARS-CoV-2 target nucleic acids are NOT DETECTED. ? ?The SARS-CoV-2 RNA is generally detectable in upper respiratory ?specimens during the acute phase of infection. The lowest ?concentration of SARS-CoV-2 viral copies this assay can detect is ?138  copies/mL. A negative result does not preclude SARS-Cov-2 ?infection and should not be used as the sole basis for treatment or ?other patient management decisions. A negative result may occur with  ?improper specimen collection/handling, submission of specimen other ?than nasopharyngeal swab, presence of viral mutation(s) within the ?areas targeted by this assay, and inadequate number of viral ?copies(<138 copies/mL). A negative result must be combined with ?clinical observations, patient history, and epidemiological ?information. The expected result is Negative. ? ?Fact Sheet for Patients:  ?EntrepreneurPulse.com.au ? ?Fact Sheet for Healthcare Providers:  ?IncredibleEmployment.be ? ?This test is no t yet approved or cleared by the [place] FDA and  ?has been authorized for detection and/or diagnosis of SARS-CoV-2 by ?FDA under an Emergency Use Authorization (EUA). This EUA will remain  ?in effect (meaning this test can be used) for the duration of the ?COVID-19 declaration under Section 564(b)(1) of the Act, 21 ?U.S.C.section 360bbb-3(b)(1), unless the authorization is terminated  ?or revoked sooner.  ? ? ?  ? Influenza A by PCR NEGATIVE NEGATIVE Final  ? Influenza B by PCR NEGATIVE NEGATIVE Final  ?  Comment: (NOTE) ?The Xpert Xpress SARS-CoV-2/FLU/RSV plus assay is intended as an aid ?in the diagnosis of influenza from Nasopharyngeal swab specimens and ?should not be used as a sole basis for treatment. Nasal washings and ?aspirates are unacceptable for Xpert Xpress SARS-CoV-2/FLU/RSV ?testing. ? ?Fact Sheet for Patients: ?EntrepreneurPulse.com.au ? ?Fact Sheet for Healthcare Providers: ?IncredibleEmployment.be ? ?This test is not yet approved or cleared by the [place] FDA and ?has been authorized for detection and/or diagnosis of SARS-CoV-2 by ?FDA under an Emergency Use Authorization (EUA). This EUA will remain ?in effect (meaning  this test can be used) for the duration of the ?COVID-19 declaration under Section 564(b)(1) of the Act, 21 U.S.C. ?section 360bbb-3(b)(1), unless the authorization is terminated or ?revoked. ? ?Performed at [facility], [address] ?[place], [place] [address] ?  ? ? ?Studies/Results: ?No results found. ? ?Medications: ?Scheduled Meds: ? folic acid  1 mg Oral Daily  ? gabapentin  300 mg Oral TID  ? HYDROmorphone   Intravenous Q4H  ? hydroxyurea  500 mg Oral BID  ? ketorolac  15 mg Intravenous Q6H  ? morphine  15 mg Oral Q12H  ? OLANZapine  5 mg Oral Daily  ? rivaroxaban  20 mg Oral Q supper  ? ?Continuous Infusions: ?PRN Meds:.diphenhydrAMINE, naloxone **AND** sodium chloride flush, ondansetron (ZOFRAN) IV, oxyCODONE-acetaminophen **AND** oxyCODONE, white petrolatum ? ?Consultants: ?none ? ?Procedures: ?none ? ?Antibiotics: ?none ? ?Assessment/Plan: ?Principal Problem: ?  Sickle cell pain crisis ([place]) ?Active Problems: ?  Sickle cell anemia with pain ([place]) ?  Agitation ?  Other chronic pain ?  History of DVT (deep vein thrombosis) ?  Socially inappropriate behavior ? ?Sickle cell disease with pain crisis: ?Continue IV Dilaudid PCA, will start weaning today ?Toradol 15 mg IV every 6  hours ?Continue MS Contin 15 mg every 12 hours ?Monitor vital signs closely, reevaluate pain scale regularly, and supplemental oxygen as needed ? ?Anemia of chronic disease: ?Hemoglobin is stable and consistent with patient's baseline.  There is no clinical indication for blood transfusion at this time.  Monitor closely.  Labs in AM. ? ?Chronic pain syndrome: ?Continue home medications ? ?Major depression: ?Patient is severely agitated.  Affect anxious.  He is currently not on antipsychotics, anxiolytics or antidepressants at this time.  He is also not under the care of psychiatry as an outpatient.  He denies any visual or auditory hallucinations.  He also denies any suicidal homicidal intentions. ? ?Possible  visual/auditory hallucinations: ?Patient is having frequent outbursts witnessed by Probation officer and staff.  We will start Zyprexa 5 mg daily.  Consider psychiatry consult during admission. ? ? ?Code Status: Full Code ?Famil

## 2021-05-07 NOTE — Care Management Important Message (Signed)
Important Message ? ?Patient Details IM Letter given to the Patient. ?Name: Timothy Marks ?MRN: [id number] ?Date of Birth: 10/05/1990 ? ? ?Medicare Important Message Given:  Yes ? ? ? ? ?Kerin Salen ?05/07/2021, 10:22 AM ?

## 2021-05-10 ENCOUNTER — Telehealth: Payer: Self-pay | Admitting: Internal Medicine

## 2021-05-10 DIAGNOSIS — G8929 Other chronic pain: Secondary | ICD-10-CM | POA: Diagnosis not present

## 2021-05-10 NOTE — Telephone Encounter (Signed)
Refill request percocet and morphine. Says that you discussed with him changing his oxycontin to morphine.  ?

## 2021-05-10 NOTE — Discharge Summary (Signed)
Physician Discharge Summary  ?Timothy Marks [id number] DOB: 10/31/1990 DOA: 05/03/2021 ? ?PCP: Tresa Garter, MD ? ?Admit date: 05/03/2021 ? ?Discharge date: 05/07/2021 ? ?Discharge Diagnoses:  ?Principal Problem: ?  Sickle cell pain crisis ([place]) ?Active Problems: ?  Sickle cell anemia with pain ([place]) ?  Agitation ?  Other chronic pain ?  History of DVT (deep vein thrombosis) ?  Socially inappropriate behavior ? ? ?Discharge Condition: Stable ? ?Disposition:  ?Pt is discharged home in good condition and is to follow up with Tresa Garter, MD this week to have labs evaluated. [name] is instructed to increase activity slowly and balance with rest for the next few days, and use prescribed medication to complete treatment of pain ? ?Diet: Regular ?Wt Readings from Last 3 Encounters:  ?05/03/21 61.2 kg  ?04/22/21 54.4 kg  ?01/29/21 55.1 kg  ? ? ?History of present illness:  ?Timothy Marks is a 31 year old male with a medical history significant for sickle cell disease, chronic pain syndrome, opiate dependence and tolerance, and history of anemia of chronic disease presented to the emergency department with complaints of generalized body aches typical of his sickle cell pain crisis over the past 24 hours.  Patient states that he is run out of his OxyContin over the past week.  However, he has been taking all other medications, including his blood thinners.  He denies any headache, visual symptoms, or shortness of breath. ?ED course: In the ER, patient was given multiple dose of pain relief medications despite which patient was still in pain.  He is admitted for sickle cell pain crisis.  COVID test negative.  Chest x-ray remarkable.  EKG shows early repolarization changes. ? ?Hospital Course:  ?Sickle cell disease with pain crisis: ?Patient was admitted for sickle cell pain crisis and managed appropriately with IVF, IV Dilaudid via PCA and IV Toradol, as well as other adjunct therapies per sickle  cell pain management protocols.  During admission, patient's OxyContin was discontinued and he was transition to MS Contin 15 mg every 12 hours.  Patient tolerated this medication change well.  IV Dilaudid PCA was weaned appropriately.  Patient was transitioned to his home medications.  His hemoglobin remained stable and consistent with his baseline throughout admission. ? ?Patient has a history of major depressive disorder: ?He has had this diagnosis for many years, but he has not been on any medications over the past several years.  Also, patient has had multiple referrals to psychiatry and has yet to follow-up.  During admission, it was noticed that patient was having conversations when no one else was in the room.  During assessment, patient made several outbursts to people that were supposedly in the room.  However when questioned about visual or auditory hallucinations, patient denied.  A trial of Zyprexa 5 mg at bedtime was started.  Patient tolerated well.  He will discharge home with Zyprexa 5 mg at bedtime #30.  Patient will follow-up with PCP for medication management.  Also, he will warrant a new referral to psychiatry for further work-up and evaluation. ? ?Patient was therefore discharged home today in a hemodynamically stable condition.  ? ?[name] will follow-up with PCP within 1 week of this discharge. Linden was counseled extensively about nonpharmacologic means of pain management, patient verbalized understanding and was appreciative of  the care received during this admission.  ? ?We discussed the need for good hydration, monitoring of hydration status, avoidance of heat, cold, stress, and infection triggers. We discussed the need  to be adherent with taking Hydrea and other home medications. Patient was reminded of the need to seek medical attention immediately if any symptom of bleeding, anemia, or infection occurs. ? ?Discharge Exam: ?Vitals:  ? 05/07/21 1218 05/07/21 1344  ?BP:    ?Pulse:     ?Resp:    ?Temp:    ?SpO2: 100% 97%  ? ?Vitals:  ? 05/07/21 0848 05/07/21 1121 05/07/21 1218 05/07/21 1344  ?BP:  120/66    ?Pulse:  93    ?Resp:  15    ?Temp:  98.7 ?F (37.1 ?C)    ?TempSrc:  Oral    ?SpO2: 95% 95% 100% 97%  ?Weight:      ?Height:      ? ? ?General appearance : Awake, alert, not in any distress. Speech Clear. Not toxic looking ?HEENT: Atraumatic and Normocephalic, pupils equally reactive to light and accomodation ?Neck: Supple, no JVD. No cervical lymphadenopathy.  ?Chest: Good air entry bilaterally, no added sounds  ?CVS: S1 S2 regular, no murmurs.  ?Abdomen: Bowel sounds present, Non tender and not distended with no gaurding, rigidity or rebound. ?Extremities: B/L Lower Ext shows no edema, both legs are warm to touch ?Neurology: Awake alert, and oriented X 3, CN II-XII intact, Non focal ?Skin: No Rash ? ?Discharge Instructions ? ?Discharge Instructions   ? ? Discharge patient   Complete by: As directed ?  ? Discharge disposition: 01-Home or Self Care  ? Discharge patient date: 05/07/2021  ? ?  ? ?Allergies as of 05/07/2021   ? ?   Reactions  ? Buprenorphine Hcl Hives  ? Levaquin [levofloxacin] Itching  ? Meperidine Rash  ? Morphine Hives  ? Morphine And Related Hives  ? Tramadol Hives  ? Vancomycin Itching  ? Zosyn [piperacillin Sod-tazobactam So] Itching, Rash, Other (See Comments)  ? Has taken rocephin in the past  ? ?  ? ?  ?Medication List  ?  ? ?TAKE these medications   ? ?folic acid 1 MG tablet ?Commonly known as: FOLVITE ?Take 1 tablet (1 mg total) by mouth daily. ?  ?gabapentin 300 MG capsule ?Commonly known as: NEURONTIN ?Take 1 capsule (300 mg total) by mouth 3 (three) times daily. ?  ?hydroxyurea 500 MG capsule ?Commonly known as: HYDREA ?Take 1 capsule (500 mg total) by mouth 2 (two) times daily. May take with food to minimize GI side effects. ?  ?OLANZapine 5 MG tablet ?Commonly known as: ZYPREXA ?Take 1 tablet (5 mg total) by mouth at bedtime. ?  ?oxyCODONE-acetaminophen 10-325 MG  tablet ?Commonly known as: PERCOCET ?Take 1 tablet by mouth every 6 (six) hours as needed for up to 15 days for pain. ?  ?OxyCONTIN 20 mg 12 hr tablet ?Generic drug: oxyCODONE ?Take 1 tablet (20 mg total) by mouth every 12 (twelve) hours. ?  ?rivaroxaban 20 MG Tabs tablet ?Commonly known as: Xarelto ?Take 1 tablet (20 mg total) by mouth daily with supper. ?  ? ?  ? ? ?The results of significant diagnostics from this hospitalization (including imaging, microbiology, ancillary and laboratory) are listed below for reference.   ? ?Significant Diagnostic Studies: ?DG Chest Portable 1 View ? ?Result Date: 05/03/2021 ?CLINICAL DATA:  Sickle cell crisis, chest pain. EXAM: PORTABLE CHEST 1 VIEW COMPARISON:  Chest x-ray 04/12/2021. FINDINGS: The heart size and mediastinal contours are within normal limits. Both lungs are clear. The visualized skeletal structures are unremarkable. IMPRESSION: No active disease. Electronically Signed   By: Ronney Asters M.D.   On:  05/03/2021 21:53   ? ?Microbiology: ?Recent Results (from the past 240 hour(s))  ?Resp Panel by RT-PCR (Flu A&B, Covid) Nasopharyngeal Swab     Status: None  ? Collection Time: 05/04/21  1:22 AM  ? Specimen: Nasopharyngeal Swab; Nasopharyngeal(NP) swabs in vial transport medium  ?Result Value Ref Range Status  ? SARS Coronavirus 2 by RT PCR NEGATIVE NEGATIVE Final  ?  Comment: (NOTE) ?SARS-CoV-2 target nucleic acids are NOT DETECTED. ? ?The SARS-CoV-2 RNA is generally detectable in upper respiratory ?specimens during the acute phase of infection. The lowest ?concentration of SARS-CoV-2 viral copies this assay can detect is ?138 copies/mL. A negative result does not preclude SARS-Cov-2 ?infection and should not be used as the sole basis for treatment or ?other patient management decisions. A negative result may occur with  ?improper specimen collection/handling, submission of specimen other ?than nasopharyngeal swab, presence of viral mutation(s) within the ?areas  targeted by this assay, and inadequate number of viral ?copies(<138 copies/mL). A negative result must be combined with ?clinical observations, patient history, and epidemiological ?information. The expected resu

## 2021-05-11 DIAGNOSIS — F4321 Adjustment disorder with depressed mood: Secondary | ICD-10-CM | POA: Diagnosis not present

## 2021-05-11 DIAGNOSIS — Z452 Encounter for adjustment and management of vascular access device: Secondary | ICD-10-CM | POA: Diagnosis not present

## 2021-05-11 DIAGNOSIS — F54 Psychological and behavioral factors associated with disorders or diseases classified elsewhere: Secondary | ICD-10-CM | POA: Diagnosis not present

## 2021-05-11 DIAGNOSIS — Z885 Allergy status to narcotic agent status: Secondary | ICD-10-CM | POA: Diagnosis not present

## 2021-05-11 DIAGNOSIS — Q8901 Asplenia (congenital): Secondary | ICD-10-CM | POA: Diagnosis not present

## 2021-05-11 DIAGNOSIS — R7881 Bacteremia: Secondary | ICD-10-CM | POA: Diagnosis not present

## 2021-05-11 DIAGNOSIS — Z9049 Acquired absence of other specified parts of digestive tract: Secondary | ICD-10-CM | POA: Diagnosis not present

## 2021-05-11 DIAGNOSIS — Z888 Allergy status to other drugs, medicaments and biological substances status: Secondary | ICD-10-CM | POA: Diagnosis not present

## 2021-05-11 DIAGNOSIS — Z79899 Other long term (current) drug therapy: Secondary | ICD-10-CM | POA: Diagnosis not present

## 2021-05-11 DIAGNOSIS — D75839 Thrombocytosis, unspecified: Secondary | ICD-10-CM | POA: Diagnosis not present

## 2021-05-11 DIAGNOSIS — Z86718 Personal history of other venous thrombosis and embolism: Secondary | ICD-10-CM | POA: Diagnosis not present

## 2021-05-11 DIAGNOSIS — R636 Underweight: Secondary | ICD-10-CM | POA: Diagnosis present

## 2021-05-11 DIAGNOSIS — F5105 Insomnia due to other mental disorder: Secondary | ICD-10-CM | POA: Diagnosis not present

## 2021-05-11 DIAGNOSIS — A4101 Sepsis due to Methicillin susceptible Staphylococcus aureus: Secondary | ICD-10-CM | POA: Diagnosis not present

## 2021-05-11 DIAGNOSIS — M87052 Idiopathic aseptic necrosis of left femur: Secondary | ICD-10-CM | POA: Diagnosis not present

## 2021-05-11 DIAGNOSIS — Z881 Allergy status to other antibiotic agents status: Secondary | ICD-10-CM | POA: Diagnosis not present

## 2021-05-11 DIAGNOSIS — M25552 Pain in left hip: Secondary | ICD-10-CM | POA: Diagnosis not present

## 2021-05-11 DIAGNOSIS — D72829 Elevated white blood cell count, unspecified: Secondary | ICD-10-CM | POA: Diagnosis not present

## 2021-05-11 DIAGNOSIS — Z87891 Personal history of nicotine dependence: Secondary | ICD-10-CM | POA: Diagnosis not present

## 2021-05-11 DIAGNOSIS — M25551 Pain in right hip: Secondary | ICD-10-CM | POA: Diagnosis not present

## 2021-05-11 DIAGNOSIS — R945 Abnormal results of liver function studies: Secondary | ICD-10-CM | POA: Diagnosis not present

## 2021-05-11 DIAGNOSIS — I959 Hypotension, unspecified: Secondary | ICD-10-CM | POA: Diagnosis not present

## 2021-05-11 DIAGNOSIS — Z681 Body mass index (BMI) 19 or less, adult: Secondary | ICD-10-CM | POA: Diagnosis not present

## 2021-05-11 DIAGNOSIS — I8002 Phlebitis and thrombophlebitis of superficial vessels of left lower extremity: Secondary | ICD-10-CM | POA: Diagnosis not present

## 2021-05-11 DIAGNOSIS — R000 Tachycardia, unspecified: Secondary | ICD-10-CM | POA: Diagnosis not present

## 2021-05-11 DIAGNOSIS — B9562 Methicillin resistant Staphylococcus aureus infection as the cause of diseases classified elsewhere: Secondary | ICD-10-CM | POA: Diagnosis present

## 2021-05-11 DIAGNOSIS — Z832 Family history of diseases of the blood and blood-forming organs and certain disorders involving the immune mechanism: Secondary | ICD-10-CM | POA: Diagnosis not present

## 2021-05-11 DIAGNOSIS — D571 Sickle-cell disease without crisis: Secondary | ICD-10-CM | POA: Diagnosis not present

## 2021-05-11 DIAGNOSIS — B952 Enterococcus as the cause of diseases classified elsewhere: Secondary | ICD-10-CM | POA: Diagnosis not present

## 2021-05-11 DIAGNOSIS — D5700 Hb-SS disease with crisis, unspecified: Secondary | ICD-10-CM | POA: Diagnosis not present

## 2021-05-11 DIAGNOSIS — R109 Unspecified abdominal pain: Secondary | ICD-10-CM | POA: Diagnosis not present

## 2021-05-11 DIAGNOSIS — G8929 Other chronic pain: Secondary | ICD-10-CM | POA: Diagnosis not present

## 2021-05-11 DIAGNOSIS — B9561 Methicillin susceptible Staphylococcus aureus infection as the cause of diseases classified elsewhere: Secondary | ICD-10-CM | POA: Diagnosis not present

## 2021-05-11 DIAGNOSIS — Z560 Unemployment, unspecified: Secondary | ICD-10-CM | POA: Diagnosis not present

## 2021-05-15 ENCOUNTER — Other Ambulatory Visit: Payer: Self-pay | Admitting: Internal Medicine

## 2021-05-17 ENCOUNTER — Other Ambulatory Visit: Payer: Self-pay | Admitting: Internal Medicine

## 2021-05-17 MED ORDER — MORPHINE SULFATE ER 15 MG PO TBCR: 15.0000 mg | EXTENDED_RELEASE_TABLET | Freq: Two times a day (BID) | ORAL | 0 refills | Status: DC

## 2021-05-17 MED ORDER — OXYCODONE-ACETAMINOPHEN 10-325 MG PO TABS: 1.0000 | ORAL_TABLET | Freq: Four times a day (QID) | ORAL | 0 refills | Status: DC | PRN

## 2021-05-20 DIAGNOSIS — B9561 Methicillin susceptible Staphylococcus aureus infection as the cause of diseases classified elsewhere: Secondary | ICD-10-CM | POA: Diagnosis not present

## 2021-05-20 DIAGNOSIS — D5700 Hb-SS disease with crisis, unspecified: Secondary | ICD-10-CM | POA: Diagnosis not present

## 2021-05-20 DIAGNOSIS — R7881 Bacteremia: Secondary | ICD-10-CM | POA: Diagnosis not present

## 2021-05-21 DIAGNOSIS — R7881 Bacteremia: Secondary | ICD-10-CM | POA: Diagnosis not present

## 2021-05-21 DIAGNOSIS — B9561 Methicillin susceptible Staphylococcus aureus infection as the cause of diseases classified elsewhere: Secondary | ICD-10-CM | POA: Diagnosis not present

## 2021-05-21 DIAGNOSIS — D5700 Hb-SS disease with crisis, unspecified: Secondary | ICD-10-CM | POA: Diagnosis not present

## 2021-05-22 DIAGNOSIS — B9561 Methicillin susceptible Staphylococcus aureus infection as the cause of diseases classified elsewhere: Secondary | ICD-10-CM | POA: Diagnosis not present

## 2021-05-22 DIAGNOSIS — R7881 Bacteremia: Secondary | ICD-10-CM | POA: Diagnosis not present

## 2021-05-22 DIAGNOSIS — D5700 Hb-SS disease with crisis, unspecified: Secondary | ICD-10-CM | POA: Diagnosis not present

## 2021-05-23 DIAGNOSIS — D5700 Hb-SS disease with crisis, unspecified: Secondary | ICD-10-CM | POA: Diagnosis not present

## 2021-05-23 DIAGNOSIS — B9561 Methicillin susceptible Staphylococcus aureus infection as the cause of diseases classified elsewhere: Secondary | ICD-10-CM | POA: Diagnosis not present

## 2021-05-23 DIAGNOSIS — R7881 Bacteremia: Secondary | ICD-10-CM | POA: Diagnosis not present

## 2021-05-24 DIAGNOSIS — B9561 Methicillin susceptible Staphylococcus aureus infection as the cause of diseases classified elsewhere: Secondary | ICD-10-CM | POA: Diagnosis not present

## 2021-05-24 DIAGNOSIS — R7881 Bacteremia: Secondary | ICD-10-CM | POA: Diagnosis not present

## 2021-05-24 DIAGNOSIS — D5700 Hb-SS disease with crisis, unspecified: Secondary | ICD-10-CM | POA: Diagnosis not present

## 2021-05-25 DIAGNOSIS — B9561 Methicillin susceptible Staphylococcus aureus infection as the cause of diseases classified elsewhere: Secondary | ICD-10-CM | POA: Diagnosis not present

## 2021-05-25 DIAGNOSIS — R7881 Bacteremia: Secondary | ICD-10-CM | POA: Diagnosis not present

## 2021-05-25 DIAGNOSIS — D5700 Hb-SS disease with crisis, unspecified: Secondary | ICD-10-CM | POA: Diagnosis not present

## 2021-05-26 DIAGNOSIS — R7881 Bacteremia: Secondary | ICD-10-CM | POA: Diagnosis not present

## 2021-05-26 DIAGNOSIS — B9561 Methicillin susceptible Staphylococcus aureus infection as the cause of diseases classified elsewhere: Secondary | ICD-10-CM | POA: Diagnosis not present

## 2021-05-26 DIAGNOSIS — D5700 Hb-SS disease with crisis, unspecified: Secondary | ICD-10-CM | POA: Diagnosis not present

## 2021-05-27 ENCOUNTER — Telehealth: Payer: Self-pay

## 2021-05-27 DIAGNOSIS — B9561 Methicillin susceptible Staphylococcus aureus infection as the cause of diseases classified elsewhere: Secondary | ICD-10-CM | POA: Diagnosis not present

## 2021-05-27 DIAGNOSIS — R7881 Bacteremia: Secondary | ICD-10-CM | POA: Diagnosis not present

## 2021-05-27 DIAGNOSIS — D5700 Hb-SS disease with crisis, unspecified: Secondary | ICD-10-CM | POA: Diagnosis not present

## 2021-05-27 NOTE — Telephone Encounter (Signed)
Percocet refill

## 2021-05-28 DIAGNOSIS — D5700 Hb-SS disease with crisis, unspecified: Secondary | ICD-10-CM | POA: Diagnosis not present

## 2021-05-28 DIAGNOSIS — B9561 Methicillin susceptible Staphylococcus aureus infection as the cause of diseases classified elsewhere: Secondary | ICD-10-CM | POA: Diagnosis not present

## 2021-05-28 DIAGNOSIS — R7881 Bacteremia: Secondary | ICD-10-CM | POA: Diagnosis not present

## 2021-05-29 DIAGNOSIS — R7881 Bacteremia: Secondary | ICD-10-CM | POA: Diagnosis not present

## 2021-05-29 DIAGNOSIS — D5700 Hb-SS disease with crisis, unspecified: Secondary | ICD-10-CM | POA: Diagnosis not present

## 2021-05-29 DIAGNOSIS — B9561 Methicillin susceptible Staphylococcus aureus infection as the cause of diseases classified elsewhere: Secondary | ICD-10-CM | POA: Diagnosis not present

## 2021-05-30 DIAGNOSIS — D5700 Hb-SS disease with crisis, unspecified: Secondary | ICD-10-CM | POA: Diagnosis not present

## 2021-05-30 DIAGNOSIS — R7881 Bacteremia: Secondary | ICD-10-CM | POA: Diagnosis not present

## 2021-05-30 DIAGNOSIS — B9561 Methicillin susceptible Staphylococcus aureus infection as the cause of diseases classified elsewhere: Secondary | ICD-10-CM | POA: Diagnosis not present

## 2021-05-31 ENCOUNTER — Other Ambulatory Visit: Payer: Self-pay | Admitting: Internal Medicine

## 2021-05-31 DIAGNOSIS — B9561 Methicillin susceptible Staphylococcus aureus infection as the cause of diseases classified elsewhere: Secondary | ICD-10-CM | POA: Diagnosis not present

## 2021-05-31 DIAGNOSIS — R7881 Bacteremia: Secondary | ICD-10-CM | POA: Diagnosis not present

## 2021-05-31 DIAGNOSIS — D5700 Hb-SS disease with crisis, unspecified: Secondary | ICD-10-CM | POA: Diagnosis not present

## 2021-05-31 MED ORDER — OXYCODONE-ACETAMINOPHEN 10-325 MG PO TABS: 1.0000 | ORAL_TABLET | Freq: Four times a day (QID) | ORAL | 0 refills | Status: DC | PRN

## 2021-06-01 DIAGNOSIS — B9561 Methicillin susceptible Staphylococcus aureus infection as the cause of diseases classified elsewhere: Secondary | ICD-10-CM | POA: Diagnosis not present

## 2021-06-01 DIAGNOSIS — D5700 Hb-SS disease with crisis, unspecified: Secondary | ICD-10-CM | POA: Diagnosis not present

## 2021-06-01 DIAGNOSIS — R7881 Bacteremia: Secondary | ICD-10-CM | POA: Diagnosis not present

## 2021-06-02 DIAGNOSIS — D5700 Hb-SS disease with crisis, unspecified: Secondary | ICD-10-CM | POA: Diagnosis not present

## 2021-06-02 DIAGNOSIS — B9561 Methicillin susceptible Staphylococcus aureus infection as the cause of diseases classified elsewhere: Secondary | ICD-10-CM | POA: Diagnosis not present

## 2021-06-02 DIAGNOSIS — R7881 Bacteremia: Secondary | ICD-10-CM | POA: Diagnosis not present

## 2021-06-03 DIAGNOSIS — R7881 Bacteremia: Secondary | ICD-10-CM | POA: Diagnosis not present

## 2021-06-03 DIAGNOSIS — D5700 Hb-SS disease with crisis, unspecified: Secondary | ICD-10-CM | POA: Diagnosis not present

## 2021-06-03 DIAGNOSIS — B9561 Methicillin susceptible Staphylococcus aureus infection as the cause of diseases classified elsewhere: Secondary | ICD-10-CM | POA: Diagnosis not present

## 2021-06-04 DIAGNOSIS — D5700 Hb-SS disease with crisis, unspecified: Secondary | ICD-10-CM | POA: Diagnosis not present

## 2021-06-04 DIAGNOSIS — R7881 Bacteremia: Secondary | ICD-10-CM | POA: Diagnosis not present

## 2021-06-04 DIAGNOSIS — B9561 Methicillin susceptible Staphylococcus aureus infection as the cause of diseases classified elsewhere: Secondary | ICD-10-CM | POA: Diagnosis not present

## 2021-06-05 DIAGNOSIS — R7881 Bacteremia: Secondary | ICD-10-CM | POA: Diagnosis not present

## 2021-06-05 DIAGNOSIS — D5700 Hb-SS disease with crisis, unspecified: Secondary | ICD-10-CM | POA: Diagnosis not present

## 2021-06-05 DIAGNOSIS — B9561 Methicillin susceptible Staphylococcus aureus infection as the cause of diseases classified elsewhere: Secondary | ICD-10-CM | POA: Diagnosis not present

## 2021-06-06 DIAGNOSIS — R7881 Bacteremia: Secondary | ICD-10-CM | POA: Diagnosis not present

## 2021-06-06 DIAGNOSIS — D5700 Hb-SS disease with crisis, unspecified: Secondary | ICD-10-CM | POA: Diagnosis not present

## 2021-06-06 DIAGNOSIS — B9561 Methicillin susceptible Staphylococcus aureus infection as the cause of diseases classified elsewhere: Secondary | ICD-10-CM | POA: Diagnosis not present

## 2021-06-07 DIAGNOSIS — R7881 Bacteremia: Secondary | ICD-10-CM | POA: Diagnosis not present

## 2021-06-07 DIAGNOSIS — B9561 Methicillin susceptible Staphylococcus aureus infection as the cause of diseases classified elsewhere: Secondary | ICD-10-CM | POA: Diagnosis not present

## 2021-06-07 DIAGNOSIS — D5700 Hb-SS disease with crisis, unspecified: Secondary | ICD-10-CM | POA: Diagnosis not present

## 2021-06-08 DIAGNOSIS — R7881 Bacteremia: Secondary | ICD-10-CM | POA: Diagnosis not present

## 2021-06-08 DIAGNOSIS — B9561 Methicillin susceptible Staphylococcus aureus infection as the cause of diseases classified elsewhere: Secondary | ICD-10-CM | POA: Diagnosis not present

## 2021-06-08 DIAGNOSIS — D5700 Hb-SS disease with crisis, unspecified: Secondary | ICD-10-CM | POA: Diagnosis not present

## 2021-06-09 DIAGNOSIS — D5700 Hb-SS disease with crisis, unspecified: Secondary | ICD-10-CM | POA: Diagnosis not present

## 2021-06-09 DIAGNOSIS — R7881 Bacteremia: Secondary | ICD-10-CM | POA: Diagnosis not present

## 2021-06-09 DIAGNOSIS — B9561 Methicillin susceptible Staphylococcus aureus infection as the cause of diseases classified elsewhere: Secondary | ICD-10-CM | POA: Diagnosis not present

## 2021-06-10 DIAGNOSIS — R7881 Bacteremia: Secondary | ICD-10-CM | POA: Diagnosis not present

## 2021-06-10 DIAGNOSIS — D5700 Hb-SS disease with crisis, unspecified: Secondary | ICD-10-CM | POA: Diagnosis not present

## 2021-06-10 DIAGNOSIS — B9561 Methicillin susceptible Staphylococcus aureus infection as the cause of diseases classified elsewhere: Secondary | ICD-10-CM | POA: Diagnosis not present

## 2021-06-11 DIAGNOSIS — R7881 Bacteremia: Secondary | ICD-10-CM | POA: Diagnosis not present

## 2021-06-11 DIAGNOSIS — D5700 Hb-SS disease with crisis, unspecified: Secondary | ICD-10-CM | POA: Diagnosis not present

## 2021-06-11 DIAGNOSIS — B9561 Methicillin susceptible Staphylococcus aureus infection as the cause of diseases classified elsewhere: Secondary | ICD-10-CM | POA: Diagnosis not present

## 2021-06-12 DIAGNOSIS — B9561 Methicillin susceptible Staphylococcus aureus infection as the cause of diseases classified elsewhere: Secondary | ICD-10-CM | POA: Diagnosis not present

## 2021-06-12 DIAGNOSIS — R7881 Bacteremia: Secondary | ICD-10-CM | POA: Diagnosis not present

## 2021-06-12 DIAGNOSIS — D5700 Hb-SS disease with crisis, unspecified: Secondary | ICD-10-CM | POA: Diagnosis not present

## 2021-06-13 DIAGNOSIS — B9561 Methicillin susceptible Staphylococcus aureus infection as the cause of diseases classified elsewhere: Secondary | ICD-10-CM | POA: Diagnosis not present

## 2021-06-13 DIAGNOSIS — R7881 Bacteremia: Secondary | ICD-10-CM | POA: Diagnosis not present

## 2021-06-13 DIAGNOSIS — Z95828 Presence of other vascular implants and grafts: Secondary | ICD-10-CM | POA: Diagnosis not present

## 2021-06-14 ENCOUNTER — Telehealth: Payer: Self-pay

## 2021-06-14 DIAGNOSIS — D5700 Hb-SS disease with crisis, unspecified: Secondary | ICD-10-CM | POA: Diagnosis not present

## 2021-06-14 DIAGNOSIS — B9561 Methicillin susceptible Staphylococcus aureus infection as the cause of diseases classified elsewhere: Secondary | ICD-10-CM | POA: Diagnosis not present

## 2021-06-14 DIAGNOSIS — R7881 Bacteremia: Secondary | ICD-10-CM | POA: Diagnosis not present

## 2021-06-14 NOTE — Telephone Encounter (Signed)
percocet

## 2021-06-15 ENCOUNTER — Other Ambulatory Visit: Payer: Self-pay | Admitting: Internal Medicine

## 2021-06-15 DIAGNOSIS — B9561 Methicillin susceptible Staphylococcus aureus infection as the cause of diseases classified elsewhere: Secondary | ICD-10-CM | POA: Diagnosis not present

## 2021-06-15 DIAGNOSIS — R7881 Bacteremia: Secondary | ICD-10-CM | POA: Diagnosis not present

## 2021-06-15 DIAGNOSIS — D5700 Hb-SS disease with crisis, unspecified: Secondary | ICD-10-CM | POA: Diagnosis not present

## 2021-06-15 MED ORDER — OXYCODONE-ACETAMINOPHEN 10-325 MG PO TABS: 1.0000 | ORAL_TABLET | Freq: Four times a day (QID) | ORAL | 0 refills | Status: DC | PRN

## 2021-06-16 DIAGNOSIS — D5700 Hb-SS disease with crisis, unspecified: Secondary | ICD-10-CM | POA: Diagnosis not present

## 2021-06-16 DIAGNOSIS — B9561 Methicillin susceptible Staphylococcus aureus infection as the cause of diseases classified elsewhere: Secondary | ICD-10-CM | POA: Diagnosis not present

## 2021-06-16 DIAGNOSIS — R7881 Bacteremia: Secondary | ICD-10-CM | POA: Diagnosis not present

## 2021-06-21 ENCOUNTER — Telehealth: Payer: Self-pay | Admitting: Internal Medicine

## 2021-06-21 NOTE — Telephone Encounter (Signed)
Morphine

## 2021-06-23 ENCOUNTER — Other Ambulatory Visit: Payer: Self-pay | Admitting: Internal Medicine

## 2021-06-23 DIAGNOSIS — D571 Sickle-cell disease without crisis: Secondary | ICD-10-CM | POA: Diagnosis not present

## 2021-06-23 DIAGNOSIS — M25551 Pain in right hip: Secondary | ICD-10-CM | POA: Diagnosis not present

## 2021-06-23 DIAGNOSIS — Q8901 Asplenia (congenital): Secondary | ICD-10-CM | POA: Diagnosis not present

## 2021-06-23 DIAGNOSIS — A4101 Sepsis due to Methicillin susceptible Staphylococcus aureus: Secondary | ICD-10-CM | POA: Diagnosis not present

## 2021-06-23 DIAGNOSIS — Z792 Long term (current) use of antibiotics: Secondary | ICD-10-CM | POA: Diagnosis not present

## 2021-06-23 MED ORDER — MORPHINE SULFATE ER 15 MG PO TBCR: 15.0000 mg | EXTENDED_RELEASE_TABLET | Freq: Two times a day (BID) | ORAL | 0 refills | Status: DC

## 2021-06-24 DIAGNOSIS — M79601 Pain in right arm: Secondary | ICD-10-CM | POA: Diagnosis not present

## 2021-06-24 DIAGNOSIS — M79602 Pain in left arm: Secondary | ICD-10-CM | POA: Diagnosis not present

## 2021-06-24 DIAGNOSIS — J9811 Atelectasis: Secondary | ICD-10-CM | POA: Diagnosis not present

## 2021-06-24 DIAGNOSIS — R079 Chest pain, unspecified: Secondary | ICD-10-CM | POA: Diagnosis not present

## 2021-06-24 DIAGNOSIS — D5700 Hb-SS disease with crisis, unspecified: Secondary | ICD-10-CM | POA: Diagnosis not present

## 2021-06-26 ENCOUNTER — Encounter (HOSPITAL_COMMUNITY): Payer: Self-pay

## 2021-06-26 ENCOUNTER — Other Ambulatory Visit: Payer: Self-pay

## 2021-06-26 ENCOUNTER — Inpatient Hospital Stay (HOSPITAL_COMMUNITY)
Admission: EM | Admit: 2021-06-26 | Discharge: 2021-07-05 | DRG: 811 | Disposition: A | Discharge status: Home / Self Care | Payer: Medicare Other | Attending: Internal Medicine | Admitting: Internal Medicine

## 2021-06-26 DIAGNOSIS — Z86718 Personal history of other venous thrombosis and embolism: Secondary | ICD-10-CM | POA: Diagnosis not present

## 2021-06-26 DIAGNOSIS — Z832 Family history of diseases of the blood and blood-forming organs and certain disorders involving the immune mechanism: Secondary | ICD-10-CM | POA: Diagnosis not present

## 2021-06-26 DIAGNOSIS — Z7901 Long term (current) use of anticoagulants: Secondary | ICD-10-CM

## 2021-06-26 DIAGNOSIS — D638 Anemia in other chronic diseases classified elsewhere: Secondary | ICD-10-CM | POA: Diagnosis present

## 2021-06-26 DIAGNOSIS — E43 Unspecified severe protein-calorie malnutrition: Secondary | ICD-10-CM | POA: Diagnosis present

## 2021-06-26 DIAGNOSIS — D57219 Sickle-cell/Hb-C disease with crisis, unspecified: Secondary | ICD-10-CM | POA: Diagnosis not present

## 2021-06-26 DIAGNOSIS — D571 Sickle-cell disease without crisis: Secondary | ICD-10-CM

## 2021-06-26 DIAGNOSIS — Z79899 Other long term (current) drug therapy: Secondary | ICD-10-CM

## 2021-06-26 DIAGNOSIS — Z87891 Personal history of nicotine dependence: Secondary | ICD-10-CM

## 2021-06-26 DIAGNOSIS — F32A Depression, unspecified: Secondary | ICD-10-CM | POA: Diagnosis present

## 2021-06-26 DIAGNOSIS — D5700 Hb-SS disease with crisis, unspecified: Secondary | ICD-10-CM | POA: Diagnosis present

## 2021-06-26 DIAGNOSIS — Z86711 Personal history of pulmonary embolism: Secondary | ICD-10-CM | POA: Diagnosis not present

## 2021-06-26 DIAGNOSIS — D72829 Elevated white blood cell count, unspecified: Secondary | ICD-10-CM | POA: Diagnosis present

## 2021-06-26 DIAGNOSIS — G894 Chronic pain syndrome: Secondary | ICD-10-CM | POA: Diagnosis present

## 2021-06-26 DIAGNOSIS — M79601 Pain in right arm: Secondary | ICD-10-CM

## 2021-06-26 DIAGNOSIS — M79621 Pain in right upper arm: Secondary | ICD-10-CM | POA: Diagnosis not present

## 2021-06-26 DIAGNOSIS — F4325 Adjustment disorder with mixed disturbance of emotions and conduct: Secondary | ICD-10-CM | POA: Diagnosis present

## 2021-06-26 DIAGNOSIS — Z681 Body mass index (BMI) 19 or less, adult: Secondary | ICD-10-CM | POA: Diagnosis not present

## 2021-06-26 DIAGNOSIS — E559 Vitamin D deficiency, unspecified: Secondary | ICD-10-CM | POA: Diagnosis present

## 2021-06-26 LAB — COMPREHENSIVE METABOLIC PANEL
ALT: 11 U/L (ref 0–44)
AST: 22 U/L (ref 15–41)
Albumin: 4.1 g/dL (ref 3.5–5.0)
Alkaline Phosphatase: 91 U/L (ref 38–126)
Anion gap: 8 (ref 5–15)
BUN: 6 mg/dL (ref 6–20)
CO2: 27 mmol/L (ref 22–32)
Calcium: 9.2 mg/dL (ref 8.9–10.3)
Chloride: 103 mmol/L (ref 98–111)
Creatinine, Ser: 0.54 mg/dL — ABNORMAL LOW (ref 0.61–1.24)
GFR, Estimated: >60 mL/min (ref >60)
Glucose, Bld: 102 mg/dL — ABNORMAL HIGH (ref 70–99)
Potassium: 3.3 mmol/L — ABNORMAL LOW (ref 3.5–5.1)
Sodium: 138 mmol/L (ref 135–145)
Total Bilirubin: 2 mg/dL — ABNORMAL HIGH (ref 0.3–1.2)
Total Protein: 8.1 g/dL (ref 6.5–8.1)

## 2021-06-26 LAB — CBC WITH DIFFERENTIAL/PLATELET
Abs Immature Granulocytes: 0.07 10*3/uL (ref 0.00–0.07)
Basophils Absolute: 0 10*3/uL (ref 0.0–0.1)
Basophils Relative: 0 %
Eosinophils Absolute: 0.3 10*3/uL (ref 0.0–0.5)
Eosinophils Relative: 2 %
HCT: 34.7 % — ABNORMAL LOW (ref 39.0–52.0)
Hemoglobin: 12 g/dL — ABNORMAL LOW (ref 13.0–17.0)
Immature Granulocytes: 1 %
Lymphocytes Relative: 12 %
Lymphs Abs: 1.5 10*3/uL (ref 0.7–4.0)
MCH: 27.6 pg (ref 26.0–34.0)
MCHC: 34.6 g/dL (ref 30.0–36.0)
MCV: 80 fL (ref 80.0–100.0)
Monocytes Absolute: 1 10*3/uL (ref 0.1–1.0)
Monocytes Relative: 8 %
Neutro Abs: 9.6 10*3/uL — ABNORMAL HIGH (ref 1.7–7.7)
Neutrophils Relative %: 77 %
Platelets: 427 10*3/uL — ABNORMAL HIGH (ref 150–400)
RBC: 4.34 MIL/uL (ref 4.22–5.81)
RDW: 21.4 % — ABNORMAL HIGH (ref 11.5–15.5)
WBC: 12.5 10*3/uL — ABNORMAL HIGH (ref 4.0–10.5)
nRBC: 0.8 % — ABNORMAL HIGH (ref 0.0–0.2)

## 2021-06-26 LAB — RETICULOCYTES
Immature Retic Fract: 43.7 % — ABNORMAL HIGH (ref 2.3–15.9)
RBC.: 4.35 MIL/uL (ref 4.22–5.81)
Retic Count, Absolute: 168.8 10*3/uL (ref 19.0–186.0)
Retic Ct Pct: 3.9 % — ABNORMAL HIGH (ref 0.4–3.1)

## 2021-06-26 MED ORDER — PROCHLORPERAZINE EDISYLATE 10 MG/2ML IJ SOLN
10.0000 mg | INTRAMUSCULAR | Status: AC
  Administered 2021-06-26: 10 mg via INTRAVENOUS
  Filled 2021-06-26: qty 2

## 2021-06-26 MED ORDER — HYDROMORPHONE HCL 2 MG/ML IJ SOLN
2.0000 mg | INTRAMUSCULAR | Status: AC
  Administered 2021-06-26: 2 mg via INTRAVENOUS
  Filled 2021-06-26: qty 1

## 2021-06-26 MED ORDER — DIPHENHYDRAMINE HCL 25 MG PO CAPS
25.0000 mg | ORAL_CAPSULE | ORAL | Status: DC | PRN
  Administered 2021-06-26 – 2021-06-27 (×3): 25 mg via ORAL
  Filled 2021-06-26 (×3): qty 1

## 2021-06-26 MED ORDER — FOLIC ACID 1 MG PO TABS
1.0000 mg | ORAL_TABLET | Freq: Every day | ORAL | Status: DC
Start: 2021-06-26 — End: 2021-07-05
  Administered 2021-06-26 – 2021-07-05 (×10): 1 mg via ORAL
  Filled 2021-06-26 (×10): qty 1

## 2021-06-26 MED ORDER — ACETAMINOPHEN 500 MG PO TABS
1000.0000 mg | ORAL_TABLET | Freq: Once | ORAL | Status: AC
  Administered 2021-06-26: 1000 mg via ORAL
  Filled 2021-06-26: qty 2

## 2021-06-26 MED ORDER — NALOXONE HCL 0.4 MG/ML IJ SOLN: 0.4000 mg | INTRAMUSCULAR | Status: DC | PRN

## 2021-06-26 MED ORDER — SODIUM CHLORIDE 0.9 % IV SOLN
12.5000 mg | Freq: Once | INTRAVENOUS | Status: AC
  Administered 2021-06-26: 12.5 mg via INTRAVENOUS
  Filled 2021-06-26: qty 0.25
  Filled 2021-06-26: qty 12.5

## 2021-06-26 MED ORDER — POLYETHYLENE GLYCOL 3350 17 G PO PACK
17.0000 g | PACK | Freq: Every day | ORAL | Status: DC | PRN
  Administered 2021-06-30: 17 g via ORAL
  Filled 2021-06-26 (×2): qty 1

## 2021-06-26 MED ORDER — MORPHINE SULFATE ER 15 MG PO TBCR
15.0000 mg | EXTENDED_RELEASE_TABLET | Freq: Two times a day (BID) | ORAL | Status: DC
  Administered 2021-06-26 – 2021-07-05 (×18): 15 mg via ORAL
  Filled 2021-06-26 (×18): qty 1

## 2021-06-26 MED ORDER — OXYCODONE-ACETAMINOPHEN 10-325 MG PO TABS: 1.0000 | ORAL_TABLET | Freq: Four times a day (QID) | ORAL | Status: DC | PRN

## 2021-06-26 MED ORDER — SENNOSIDES-DOCUSATE SODIUM 8.6-50 MG PO TABS
1.0000 | ORAL_TABLET | Freq: Two times a day (BID) | ORAL | Status: DC
  Administered 2021-06-28 – 2021-07-05 (×8): 1 via ORAL
  Filled 2021-06-26 (×17): qty 1

## 2021-06-26 MED ORDER — KETOROLAC TROMETHAMINE 15 MG/ML IJ SOLN
15.0000 mg | Freq: Once | INTRAMUSCULAR | Status: AC
  Administered 2021-06-26: 15 mg via INTRAVENOUS
  Filled 2021-06-26: qty 1

## 2021-06-26 MED ORDER — OXYCODONE HCL 5 MG PO TABS
5.0000 mg | ORAL_TABLET | Freq: Four times a day (QID) | ORAL | Status: DC | PRN
  Administered 2021-06-26 – 2021-06-29 (×7): 5 mg via ORAL
  Filled 2021-06-26 (×7): qty 1

## 2021-06-26 MED ORDER — SODIUM CHLORIDE 0.45 % IV SOLN: INTRAVENOUS | Status: DC

## 2021-06-26 MED ORDER — ONDANSETRON HCL 4 MG/2ML IJ SOLN
4.0000 mg | INTRAMUSCULAR | Status: DC | PRN
  Administered 2021-06-26: 4 mg via INTRAVENOUS
  Filled 2021-06-26: qty 2

## 2021-06-26 MED ORDER — SODIUM CHLORIDE 0.9% FLUSH: 9.0000 mL | INTRAVENOUS | Status: DC | PRN

## 2021-06-26 MED ORDER — HYDROMORPHONE HCL 2 MG/ML IJ SOLN
2.0000 mg | INTRAMUSCULAR | Status: DC | PRN
  Administered 2021-06-26: 2 mg via INTRAVENOUS
  Filled 2021-06-26: qty 1

## 2021-06-26 MED ORDER — OLANZAPINE 5 MG PO TABS
5.0000 mg | ORAL_TABLET | Freq: Every day | ORAL | Status: DC
  Administered 2021-06-26 – 2021-07-04 (×9): 5 mg via ORAL
  Filled 2021-06-26 (×9): qty 1

## 2021-06-26 MED ORDER — GABAPENTIN 300 MG PO CAPS
300.0000 mg | ORAL_CAPSULE | Freq: Three times a day (TID) | ORAL | Status: DC
  Administered 2021-06-26 – 2021-07-04 (×24): 300 mg via ORAL
  Filled 2021-06-26 (×26): qty 1

## 2021-06-26 MED ORDER — HYDROMORPHONE 1 MG/ML IV SOLN
INTRAVENOUS | Status: DC
  Administered 2021-06-26: 1 mg via INTRAVENOUS
  Administered 2021-06-26: 30 mg via INTRAVENOUS
  Administered 2021-06-27: 8.5 mg via INTRAVENOUS
  Administered 2021-06-27: 1 mg via INTRAVENOUS
  Administered 2021-06-27: 6 mg via INTRAVENOUS
  Administered 2021-06-27: 30 mg via INTRAVENOUS
  Administered 2021-06-28: 5.5 mg via INTRAVENOUS
  Administered 2021-06-29: 30 mg via INTRAVENOUS
  Administered 2021-06-29: 3 mg via INTRAVENOUS
  Administered 2021-06-29: 1 mg via INTRAVENOUS
  Filled 2021-06-26 (×4): qty 30

## 2021-06-26 MED ORDER — MIRTAZAPINE 15 MG PO TABS
7.5000 mg | ORAL_TABLET | Freq: Every evening | ORAL | Status: DC | PRN
  Administered 2021-06-26 – 2021-07-01 (×5): 7.5 mg via ORAL
  Filled 2021-06-26 (×6): qty 1

## 2021-06-26 MED ORDER — ONDANSETRON HCL 4 MG/2ML IJ SOLN
4.0000 mg | Freq: Four times a day (QID) | INTRAMUSCULAR | Status: DC | PRN
  Administered 2021-06-26 – 2021-06-27 (×2): 4 mg via INTRAVENOUS
  Filled 2021-06-26 (×2): qty 2

## 2021-06-26 MED ORDER — HYDROXYUREA 500 MG PO CAPS
500.0000 mg | ORAL_CAPSULE | Freq: Two times a day (BID) | ORAL | Status: DC
  Administered 2021-06-26 – 2021-07-05 (×18): 500 mg via ORAL
  Filled 2021-06-26 (×18): qty 1

## 2021-06-26 MED ORDER — KETOROLAC TROMETHAMINE 15 MG/ML IJ SOLN
15.0000 mg | Freq: Four times a day (QID) | INTRAMUSCULAR | Status: AC
  Administered 2021-06-26 – 2021-07-01 (×19): 15 mg via INTRAVENOUS
  Filled 2021-06-26 (×19): qty 1

## 2021-06-26 MED ORDER — KETAMINE HCL 50 MG/5ML IJ SOSY
0.2500 mg/kg | PREFILLED_SYRINGE | Freq: Once | INTRAMUSCULAR | Status: AC
Start: 2021-06-26 — End: 2021-06-26
  Administered 2021-06-26: 15 mg via INTRAVENOUS
  Filled 2021-06-26: qty 5

## 2021-06-26 MED ORDER — OXYCODONE-ACETAMINOPHEN 5-325 MG PO TABS
1.0000 | ORAL_TABLET | Freq: Four times a day (QID) | ORAL | Status: DC | PRN
  Administered 2021-06-28 – 2021-06-29 (×4): 1 via ORAL
  Filled 2021-06-26 (×4): qty 1

## 2021-06-26 MED ORDER — RIVAROXABAN 20 MG PO TABS
20.0000 mg | ORAL_TABLET | Freq: Every day | ORAL | Status: DC
Start: 2021-06-26 — End: 2021-07-05
  Administered 2021-06-26 – 2021-07-04 (×9): 20 mg via ORAL
  Filled 2021-06-26 (×9): qty 1

## 2021-06-26 NOTE — ED Triage Notes (Signed)
Pt reports with [facility] since yesterday. Pt complains of right arm pain, bilateral leg pain, and lower back pain. Pt states that he took his Morphine 15 mg last night.   ?

## 2021-06-26 NOTE — ED Provider Notes (Signed)
?[facility] DEPT ?Provider Note ? ? ?CSN: [id number] ?Arrival date & time: 06/26/21  0442 ? ?  ? ?History ? ?Chief Complaint  ?Patient presents with  ? Sickle Cell Pain Crisis  ? ? ?Timothy Marks is a 31 y.o. male with history of sickle cell anemia, prior VTE on Xarelto, and prior MSSA bacteremia who presents to the emergency department with complaints of sickle cell pain crisis that began yesterday.  Patient reports pain is to his right arm, lower back, and bilateral lower extremities, he reports this pain is consistent with his sickle cell pain.  Constant, no alleviating factors, tried taking his at home Percocet and morphine without relief.  He denies fever, chills, chest pain, dyspnea, nausea, vomiting, or abdominal pain.  Denies recent injury or change in activity.  Denies numbness, tingling, weakness, incontinence or retention. ? ?HPI ? ?  ? ?Home Medications ?Prior to Admission medications   ?Medication Sig Start Date End Date Taking? Authorizing Provider  ?cholecalciferol (VITAMIN D) 25 MCG (1000 UNIT) tablet Take 1,000 Units by mouth daily.   Yes [provider]  ?folic acid (FOLVITE) 1 MG tablet Take 1 tablet (1 mg total) by mouth daily. 05/07/20  Yes Tresa Garter, MD  ?gabapentin (NEURONTIN) 300 MG capsule Take 1 capsule (300 mg total) by mouth 3 (three) times daily. 02/02/21  Yes Tresa Garter, MD  ?hydroxyurea (HYDREA) 500 MG capsule Take 1 capsule (500 mg total) by mouth 2 (two) times daily. May take with food to minimize GI side effects. 05/07/20  Yes Tresa Garter, MD  ?hydrOXYzine (ATARAX) 25 MG tablet Take 25 mg by mouth daily as needed for anxiety or vomiting. 05/19/21  Yes [provider]  ?morphine (MS CONTIN) 15 MG 12 hr tablet Take 1 tablet (15 mg total) by mouth every 12 (twelve) hours. 06/23/21 07/23/21 Yes Tresa Garter, MD  ?OLANZapine (ZYPREXA) 5 MG tablet Take 1 tablet (5 mg total) by mouth at bedtime. 05/07/21  Yes  Dorena Dew, FNP  ?oxyCODONE-acetaminophen (PERCOCET) 10-325 MG tablet Take 1 tablet by mouth every 6 (six) hours as needed for up to 15 days for pain. 06/16/21 07/01/21 Yes Tresa Garter, MD  ?rivaroxaban (XARELTO) 20 MG TABS tablet Take 1 tablet (20 mg total) by mouth daily with supper. 06/24/20  Yes Tresa Garter, MD  ?mirtazapine (REMERON) 7.5 MG tablet Take 7.5 mg by mouth at bedtime as needed (for sleep). 05/19/21   [provider]  ?   ? ?Allergies    ?Buprenorphine hcl, Levaquin [levofloxacin], Meperidine, Morphine, Morphine and related, Tramadol, Vancomycin, and Zosyn [piperacillin sod-tazobactam so]   ? ?Review of Systems   ?Review of Systems  ?Constitutional:  Negative for chills, fever and unexpected weight change.  ?Respiratory:  Negative for cough and shortness of breath.   ?Cardiovascular:  Negative for chest pain.  ?Gastrointestinal:  Negative for abdominal pain, nausea and vomiting.  ?Genitourinary:  Negative for dysuria.  ?Musculoskeletal:  Positive for arthralgias, back pain and myalgias.  ?Neurological:  Negative for weakness and numbness.  ?     Negative for saddle anesthesia or bowel/bladder incontinence.   ?All other systems reviewed and are negative. ? ?Physical Exam ?Updated Vital Signs ?BP 123/87 (BP Location: Left Arm)   Pulse 66   Temp 98.3 ?F (36.8 ?C) (Oral)   Resp 14   Ht '6\' 2"'$  (1.88 m)   Wt 59 kg   SpO2 100%   BMI 16.69 kg/m?  ?Physical Exam ?  Vitals and nursing note reviewed.  ?Constitutional:   ?   General: He is in acute distress (mildly uncomfortable).  ?   Appearance: He is well-developed. He is not toxic-appearing.  ?HENT:  ?   Head: Normocephalic and atraumatic.  ?Eyes:  ?   General:     ?   Right eye: No discharge.     ?   Left eye: No discharge.  ?   Conjunctiva/sclera: Conjunctivae normal.  ?Cardiovascular:  ?   Rate and Rhythm: Normal rate and regular rhythm.  ?   Comments: 2+ symmetric radial & DP pulses bilaterally.  ?Pulmonary:  ?   Effort:  No respiratory distress.  ?   Breath sounds: Normal breath sounds. No wheezing or rales.  ?Abdominal:  ?   General: There is no distension.  ?   Palpations: Abdomen is soft.  ?   Tenderness: There is no abdominal tenderness. There is no guarding or rebound.  ?Musculoskeletal:  ?   Cervical back: Neck supple.  ?   Comments: No significant erythema, edema, or obvious deformities.  Patient actively ranging all major joints throughout the bilateral upper and lower extremities.  He does have some pain with range of motion to the right upper extremity in the bilateral lower extremities.  Tender to palpation to the diffuse right upper extremity from the shoulder distally.  Also tender to the bilateral thighs and knees.  Compartments are soft.  Otherwise no significant tenderness to palpation.  Diffuse lumbar tenderness including midline and bilateral paraspinal tenderness without point/focal vertebral tenderness.  ?Skin: ?   General: Skin is warm and dry.  ?Neurological:  ?   Mental Status: He is alert.  ?   Comments: Clear speech. Sensation grossly intact to bilateral upper/lower extremities. 5/5 symmetric grip strength and strength with plantar/dorsiflexion bilaterally.   ?Psychiatric:     ?   Behavior: Behavior normal.  ? ? ?ED Results / Procedures / Treatments   ?Labs ?(all labs ordered are listed, but only abnormal results are displayed) ?Labs Reviewed  ?RETICULOCYTES  ?COMPREHENSIVE METABOLIC PANEL  ?CBC WITH DIFFERENTIAL/PLATELET  ? ? ?EKG ?None ? ?Radiology ?No results found. ? ?Procedures ?Procedures  ? ? ?Medications Ordered in ED ?Medications  ?HYDROmorphone (DILAUDID) injection 2 mg (has no administration in time range)  ?HYDROmorphone (DILAUDID) injection 2 mg (has no administration in time range)  ?HYDROmorphone (DILAUDID) injection 2 mg (has no administration in time range)  ?diphenhydrAMINE (BENADRYL) 12.5 mg in sodium chloride 0.9 % 50 mL IVPB (has no administration in time range)  ?ondansetron (ZOFRAN)  injection 4 mg (has no administration in time range)  ?0.45 % sodium chloride infusion (has no administration in time range)  ? ? ?ED Course/ Medical Decision Making/ A&P ?  ?                        ?Medical Decision Making ?Amount and/or Complexity of Data Reviewed ?Labs: ordered. ? ?Risk ?Prescription drug management. ? ?Patient presents to the emergency department with complaints of sickle cell pain crisis.  He is nontoxic, vitals without significant abnormality.  Reporting pain in the right upper extremity, lower back, bilateral lower extremities.  No fever, chest pain, or respiratory symptoms to raise concern for acute chest syndrome.  He does have pain with ranging throughout the affected extremities, however he is able to actively range all major joints and there is no overlying erythema/warmth to any specific joint to raise concern for septic joint.  No significant edema noted to the extremities, currently on Xarelto, feel that acute DVT would be less likely.  He is neurovascularly intact distally.  Patient states that pain feels similar to prior sickle cell pain.  Patient initiated on sickle cell pain management protocol. ? ?Patient care signed out to [place] @ shift change pending remaining work-up, re-assessment, and disposition.  ? ?External records reviewed for additional history ?Most recently seen by infectious disease 06/23/2021-noted to have finished his course of antibiotic therapy for MSSA bacteremia, finished 4 weeks of daptomycin.  ? ?Final Clinical Impression(s) / ED Diagnoses ?Final diagnoses:  ?Sickle cell pain crisis ([place])  ? ? ?Rx / DC Orders ?ED Discharge Orders   ? ? None  ? ?  ? ? ?  ?Amaryllis Dyke, PA-C ?06/26/21 4462 ? ?  ?Deno Etienne, DO ?06/26/21 [phone] ? ?

## 2021-06-26 NOTE — H&P (Signed)
H&P ? Patient Demographics:  ?Timothy Marks, is a 31 y.o. male  MRN: [id number]   DOB - 1990/09/16 ? ?Admit Date - 06/26/2021 ? ?Outpatient Primary MD for the patient is Tresa Garter, MD ? ?Chief Complaint  ?Patient presents with  ? Sickle Cell Pain Crisis  ?  ? ? HPI:  ? ?Timothy Marks  is a 31 y.o. male with a medical history significant for sickle cell disease, chronic pain syndrome, opiate dependence and tolerance, and anemia of chronic disease who presented to the emergency room today with major complaints of pain in his lower back and extremities that has been ongoing for few days.  Patient states this is typical of her sickle cell pain crisis.  He took his home pain medications with no sustained relief.  Patient claims he was admitted at another facility recently where he said he was diagnosed with blood infection and he completed 4 weeks of IV antibiotics.  He felt better after completion of treatment but started feeling increasing pain shortly after.  He currently rates his pain at 10/10, characterized as throbbing and achy.  No aggravating or relieving factors.  He however denies any fever, cough, chest pain, shortness of breath, nausea, vomiting or diarrhea.  No urinary symptoms.  He denies any recent travels, no sick contacts or known exposure to COVID-19. ? ?ED course: ?Vital signs: BP 116/77   Pulse 75   Temp 98.3 ?F (36.8 ?C) (Oral)   Resp 20   Ht '6\' 2"'$  (1.88 m)   Wt 59 kg   SpO2 100%   BMI 16.69 kg/m? Marland Kitchen  Patient was found to be in acute pain distress, comprehensive metabolic panel showed potassium of 3.3 otherwise normal, his hemoglobin was 12.0, WBC count of 12.5 and platelet count of 427.  Patient received multiple doses of IV Dilaudid in the emergency room with no significant relief of his pain.  Patient will be admitted for further evaluation and management of sickle cell pain crisis. ? ? Review of systems:  ?In addition to the HPI above, patient reports ?No fever or chills ?No  Headache, No changes with vision or hearing ?No problems swallowing food or liquids ?No chest pain, cough or shortness of breath ?No abdominal pain, No nausea or vomiting, Bowel movements are regular ?No blood in stool or urine ?No dysuria ?No new skin rashes or bruises ?No new joints pains-aches ?No new weakness, tingling, numbness in any extremity ?No recent weight gain or loss ?No polyuria, polydypsia or polyphagia ?No significant Mental Stressors ? ?A full 10 point Review of Systems was done, except as stated above, all other Review of Systems were negative. ? ?With Past History of the following :  ? ?Past Medical History:  ?Diagnosis Date  ? Depression   ? Generalized weakness 03/31/2019  ? 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 Sickle cell anemia ([facility])   ? Vitamin D deficiency y-6  ?   ? ?Past Surgical History:  ?Procedure Laterality Date  ? CHOLECYSTECTOMY    ? ? ? Social History:  ? ?Social History  ? ?Tobacco Use  ? Smoking status: Former  ?  Types: Cigarettes  ? Smokeless tobacco: Never  ?Substance Use Topics  ? Alcohol use: No  ?  ? ?Lives - At home ? ? Family History :  ? ?Family History  ?Problem Relation Age of Onset  ? Sickle cell trait Mother   ? Sickle cell trait  Father   ? ? ? Home Medications:  ? ?Prior to Admission medications   ?Medication Sig Start Date End Date Taking? Authorizing Provider  ?cholecalciferol (VITAMIN D) 25 MCG (1000 UNIT) tablet Take 1,000 Units by mouth daily.   Yes [provider]  ?folic acid (FOLVITE) 1 MG tablet Take 1 tablet (1 mg total) by mouth daily. 05/07/20  Yes Tresa Garter, MD  ?gabapentin (NEURONTIN) 300 MG capsule Take 1 capsule (300 mg total) by mouth 3 (three) times daily. 02/02/21  Yes Tresa Garter, MD  ?hydroxyurea (HYDREA) 500 MG capsule Take 1 capsule (500 mg total) by mouth 2 (two) times daily. May take with food to minimize GI side effects. 05/07/20  Yes Tresa Garter, MD  ?hydrOXYzine (ATARAX) 25 MG tablet Take 25 mg by mouth daily as needed for anxiety or vomiting. 05/19/21  Yes [provider]  ?morphine (MS CONTIN) 15 MG 12 hr tablet Take 1 tablet (15 mg total) by mouth every 12 (twelve) hours. 06/23/21 07/23/21 Yes Tresa Garter, MD  ?OLANZapine (ZYPREXA) 5 MG tablet Take 1 tablet (5 mg total) by mouth at bedtime. 05/07/21  Yes Dorena Dew, FNP  ?oxyCODONE-acetaminophen (PERCOCET) 10-325 MG tablet Take 1 tablet by mouth every 6 (six) hours as needed for up to 15 days for pain. 06/16/21 07/01/21 Yes Tresa Garter, MD  ?rivaroxaban (XARELTO) 20 MG TABS tablet Take 1 tablet (20 mg total) by mouth daily with supper. 06/24/20  Yes Tresa Garter, MD  ?mirtazapine (REMERON) 7.5 MG tablet Take 7.5 mg by mouth at bedtime as needed (for sleep). 05/19/21   [provider]  ? ? ? Allergies:  ? ?Allergies  ?Allergen Reactions  ? Buprenorphine Hcl Hives  ? Levaquin [Levofloxacin] Itching  ? Meperidine Rash  ? Morphine Hives  ? Morphine And Related Hives  ? Tramadol Hives  ? Vancomycin Itching  ? Zosyn [Piperacillin Sod-Tazobactam So] Itching, Rash and Other (See Comments)  ?  Has taken rocephin in the past  ? ? ? Physical Exam:  ? ?Vitals:  ? ?Vitals:  ? 06/26/21 1130 06/26/21 1155  ?BP: 109/73 116/77  ?Pulse:  74  ?Resp: 13 11  ?Temp:    ?SpO2: 100% 100%  ? ? ?Physical Exam: ?Constitutional: Patient appears well-developed and well-nourished. Not in obvious distress.  Thin built. ?HENT: Normocephalic, atraumatic, External right and left ear normal. Oropharynx is clear and moist.  ?Eyes: Conjunctivae and EOM are normal. PERRLA, no scleral icterus. ?Neck: Normal ROM. Neck supple. No JVD. No tracheal deviation. No thyromegaly. ?CVS: RRR, S1/S2 +, no murmurs, no gallops, no carotid bruit.  ?Pulmonary: Effort and breath sounds normal, no stridor, rhonchi, wheezes, rales.  ?Abdominal: Soft. BS +, no distension, tenderness, rebound or guarding.   ?Musculoskeletal: Normal range of motion. No edema and no tenderness.  ?Lymphadenopathy: No lymphadenopathy noted, cervical, inguinal or axillary ?Neuro: Alert. Normal reflexes, muscle tone coordination. No cranial nerve deficit. ?Skin: Skin is warm and dry. No rash noted. Not diaphoretic. No erythema. No pallor. ?Psychiatric: Normal mood and affect. Behavior, judgment, thought content normal. ? ? Data Review:  ? ?CBC ?Recent Labs  ?Lab 06/26/21 ?0600  ?WBC 12.5*  ?HGB 12.0*  ?HCT 34.7*  ?PLT 427*  ?MCV 80.0  ?MCH 27.6  ?MCHC 34.6  ?RDW 21.4*  ?LYMPHSABS 1.5  ?MONOABS 1.0  ?EOSABS 0.3  ?BASOSABS 0.0  ? ?------------------------------------------------------------------------------------------------------------------ ? ?Chemistries  ?Recent Labs  ?Lab 06/26/21 ?0600  ?NA 138  ?K 3.3*  ?CL  103  ?CO2 27  ?GLUCOSE 102*  ?BUN 6  ?CREATININE 0.54*  ?CALCIUM 9.2  ?AST 22  ?ALT 11  ?ALKPHOS 91  ?BILITOT 2.0*  ? ?------------------------------------------------------------------------------------------------------------------ ?estimated creatinine clearance is 110.6 mL/min (A) (by C-G formula based on SCr of 0.54 mg/dL (L)). ?------------------------------------------------------------------------------------------------------------------ ?No results for input(s): TSH, T4TOTAL, T3FREE, THYROIDAB in the last 72 hours. ? ?Invalid input(s): FREET3 ? ?Coagulation profile ?No results for input(s): INR, PROTIME in the last 168 hours. ?------------------------------------------------------------------------------------------------------------------- ?No results for input(s): DDIMER in the last 72 hours. ?------------------------------------------------------------------------------------------------------------------- ? ?Cardiac Enzymes ?No results for input(s): CKMB, TROPONINI, MYOGLOBIN in the last 168 hours. ? ?Invalid input(s):  CK ?------------------------------------------------------------------------------------------------------------------ ?No results found for: BNP ? ?--------------------------------------------------------------------------------------------------------------- ? ?Urinalysis ?   ?Component Value Date/Time  ? COLORURINE YELLOW

## 2021-06-26 NOTE — ED Provider Notes (Signed)
Accepted handoff at shift change from S.P. PA-C. Please see prior provider note for more detail.  ? ?Briefly: Patient is 31 y.o.  ? ? ? ?Plan: Attempted control pain consider admission if unable to ? ? ? ? ?Physical Exam  ?BP 116/77   Pulse 75   Temp 98.3 ?F (36.8 ?C) (Oral)   Resp 20   Ht '6\' 2"'$  (1.88 m)   Wt 59 kg   SpO2 100%   BMI 16.69 kg/m?  ? ?Physical Exam ?Vitals and nursing note reviewed.  ?Constitutional:   ?   General: He is in acute distress.  ?HENT:  ?   Head: Normocephalic and atraumatic.  ?   Nose: Nose normal.  ?Eyes:  ?   General: No scleral icterus. ?Cardiovascular:  ?   Rate and Rhythm: Normal rate and regular rhythm.  ?   Pulses: Normal pulses.  ?   Heart sounds: Normal heart sounds.  ?Pulmonary:  ?   Effort: Pulmonary effort is normal. No respiratory distress.  ?   Breath sounds: No wheezing.  ?Abdominal:  ?   Palpations: Abdomen is soft.  ?   Tenderness: There is no abdominal tenderness.  ?Musculoskeletal:  ?   Cervical back: Normal range of motion.  ?   Right lower leg: No edema.  ?   Left lower leg: No edema.  ?Skin: ?   General: Skin is warm and dry.  ?   Capillary Refill: Capillary refill takes less than 2 seconds.  ?Neurological:  ?   Mental Status: He is alert. Mental status is at baseline.  ?Psychiatric:     ?   Mood and Affect: Mood normal.     ?   Behavior: Behavior normal.  ? ? ?Procedures  ?Procedures ?Results for orders placed or performed during the hospital encounter of 06/26/21  ?Reticulocytes  ?Result Value Ref Range  ? Retic Ct Pct 3.9 (H) 0.4 - 3.1 %  ? RBC. 4.35 4.22 - 5.81 MIL/uL  ? Retic Count, Absolute 168.8 19.0 - 186.0 K/uL  ? Immature Retic Fract 43.7 (H) 2.3 - 15.9 %  ?Comprehensive metabolic panel  ?Result Value Ref Range  ? Sodium 138 135 - 145 mmol/L  ? Potassium 3.3 (L) 3.5 - 5.1 mmol/L  ? Chloride 103 98 - 111 mmol/L  ? CO2 27 22 - 32 mmol/L  ? Glucose, Bld 102 (H) 70 - 99 mg/dL  ? BUN 6 6 - 20 mg/dL  ? Creatinine, Ser 0.54 (L) 0.61 - 1.24 mg/dL  ? Calcium  9.2 8.9 - 10.3 mg/dL  ? Total Protein 8.1 6.5 - 8.1 g/dL  ? Albumin 4.1 3.5 - 5.0 g/dL  ? AST 22 15 - 41 U/L  ? ALT 11 0 - 44 U/L  ? Alkaline Phosphatase 91 38 - 126 U/L  ? Total Bilirubin 2.0 (H) 0.3 - 1.2 mg/dL  ? GFR, Estimated >60 >60 mL/min  ? Anion gap 8 5 - 15  ?CBC WITH DIFFERENTIAL  ?Result Value Ref Range  ? WBC 12.5 (H) 4.0 - 10.5 K/uL  ? RBC 4.34 4.22 - 5.81 MIL/uL  ? Hemoglobin 12.0 (L) 13.0 - 17.0 g/dL  ? HCT 34.7 (L) 39.0 - 52.0 %  ? MCV 80.0 80.0 - 100.0 fL  ? MCH 27.6 26.0 - 34.0 pg  ? MCHC 34.6 30.0 - 36.0 g/dL  ? RDW 21.4 (H) 11.5 - 15.5 %  ? Platelets 427 (H) 150 - 400 K/uL  ? nRBC 0.8 (H) 0.0 -  0.2 %  ? Neutrophils Relative % 77 %  ? Neutro Abs 9.6 (H) 1.7 - 7.7 K/uL  ? Lymphocytes Relative 12 %  ? Lymphs Abs 1.5 0.7 - 4.0 K/uL  ? Monocytes Relative 8 %  ? Monocytes Absolute 1.0 0.1 - 1.0 K/uL  ? Eosinophils Relative 2 %  ? Eosinophils Absolute 0.3 0.0 - 0.5 K/uL  ? Basophils Relative 0 %  ? Basophils Absolute 0.0 0.0 - 0.1 K/uL  ? Immature Granulocytes 1 %  ? Abs Immature Granulocytes 0.07 0.00 - 0.07 K/uL  ? [place] PRESENT   ? Polychromasia PRESENT   ? Sickle Cells PRESENT   ? Target Cells PRESENT   ? ?No results found.  ?ED Course / MDM  ?  ?Medical Decision Making ?Amount and/or Complexity of Data Reviewed ?Labs: ordered. ? ?Risk ?OTC drugs. ?Prescription drug management. ?Decision regarding hospitalization. ? ? ?I evaluated patient.  He is being hydrated via IV, has received Dilaudid x3 and I will add in Tylenol Toradol and a dose of ketamine.  I reassessed patient states his pain is only marginally improved.  He is requesting admission.  Will consult hospitalist for admission. ? ?Dr. Doreene Burke admitting.  ? ? ? ?  ?Tedd Sias, [place] ?06/26/21 1131 ? ?  ?Fredia Sorrow, MD ?06/27/21 [phone] ? ?

## 2021-06-27 DIAGNOSIS — G894 Chronic pain syndrome: Secondary | ICD-10-CM | POA: Diagnosis not present

## 2021-06-27 DIAGNOSIS — F4325 Adjustment disorder with mixed disturbance of emotions and conduct: Secondary | ICD-10-CM | POA: Diagnosis not present

## 2021-06-27 DIAGNOSIS — Z86718 Personal history of other venous thrombosis and embolism: Secondary | ICD-10-CM | POA: Diagnosis not present

## 2021-06-27 DIAGNOSIS — D5700 Hb-SS disease with crisis, unspecified: Secondary | ICD-10-CM | POA: Diagnosis not present

## 2021-06-27 LAB — CBC
HCT: 28.2 % — ABNORMAL LOW (ref 39.0–52.0)
Hemoglobin: 9.4 g/dL — ABNORMAL LOW (ref 13.0–17.0)
MCH: 26.8 pg (ref 26.0–34.0)
MCHC: 33.3 g/dL (ref 30.0–36.0)
MCV: 80.3 fL (ref 80.0–100.0)
Platelets: 374 10*3/uL (ref 150–400)
RBC: 3.51 MIL/uL — ABNORMAL LOW (ref 4.22–5.81)
RDW: 21.6 % — ABNORMAL HIGH (ref 11.5–15.5)
WBC: 10.9 10*3/uL — ABNORMAL HIGH (ref 4.0–10.5)
nRBC: 0.6 % — ABNORMAL HIGH (ref 0.0–0.2)

## 2021-06-27 MED ORDER — HYDROMORPHONE HCL 1 MG/ML IJ SOLN
0.5000 mg | Freq: Once | INTRAMUSCULAR | Status: AC
  Administered 2021-06-27: 0.5 mg via INTRAVENOUS
  Filled 2021-06-27: qty 0.5

## 2021-06-27 NOTE — Progress Notes (Signed)
Patient ID: Timothy Marks, male   DOB: February 01, 1991, 32 y.o.   MRN: [id number] ?Subjective: ?Timothy Marks is a 31 year old male with a medical history significant for sickle cell disease, chronic pain syndrome, opiate dependence and tolerance, oppositional defiance, and anemia of chronic disease was admitted for sickle cell pain crisis. ? ?Patient claims he is still in significant pain, localized to his lower back and lower extremities.  He said his pain is at 9/10 today.  He denies any chest pain, cough, shortness of breath, nausea, vomiting or diarrhea.  Patient said he would like to have Mediport inserted during this admission to avoid multiple needle sticks for IV access. ? ?Objective: ? ?Vital signs in last 24 hours: ? ?Vitals:  ? 06/27/21 6073 06/27/21 0837 06/27/21 1158 06/27/21 1310  ?BP: 107/67  109/64   ?Pulse: 73  88   ?Resp:  14  14  ?Temp: 98.4 ?F (36.9 ?C)  98.7 ?F (37.1 ?C)   ?TempSrc: Axillary  Axillary   ?SpO2: 93% 93% 99% 100%  ?Weight:      ?Height:      ? ? ?Intake/Output from previous day: ? ? ?Intake/Output Summary (Last 24 hours) at 06/27/2021 1535 ?Last data filed at 06/27/2021 1200 ?Gross per 24 hour  ?Intake 2031.49 ml  ?Output 2000 ml  ?Net 31.49 ml  ? ? ?Physical Exam: ?General: Alert, awake, oriented x3, in no acute distress.  Thin built ?HEENT: [place]/AT PEERL, EOMI ?Neck: Trachea midline,  no masses, no thyromegal,y no JVD, no carotid bruit ?OROPHARYNX:  Moist, No exudate/ erythema/lesions.  ?Heart: Regular rate and rhythm, without murmurs, rubs, gallops, PMI non-displaced, no heaves or thrills on palpation.  ?Lungs: Clear to auscultation, no wheezing or rhonchi noted. No increased vocal fremitus resonant to percussion  ?Abdomen: Soft, nontender, nondistended, positive bowel sounds, no masses no hepatosplenomegaly noted.Marland Kitchen  ?Neuro: No focal neurological deficits noted cranial nerves II through XII grossly intact. DTRs 2+ bilaterally upper and lower extremities. Strength 5 out of 5 in bilateral  upper and lower extremities. ?Musculoskeletal: No warm swelling or erythema around joints, no spinal tenderness noted. ?Psychiatric: Patient alert and oriented x3, good insight and cognition, good recent to remote recall. ?Lymph node survey: No cervical axillary or inguinal lymphadenopathy noted. ? ?Lab Results: ? ?Basic Metabolic Panel: ?   ?Component Value Date/Time  ? NA 138 06/26/2021 0600  ? NA 139 04/22/2021 1106  ? K 3.3 (L) 06/26/2021 0600  ? CL 103 06/26/2021 0600  ? CO2 27 06/26/2021 0600  ? BUN 6 06/26/2021 0600  ? BUN 6 04/22/2021 1106  ? CREATININE 0.54 (L) 06/26/2021 0600  ? GLUCOSE 102 (H) 06/26/2021 0600  ? CALCIUM 9.2 06/26/2021 0600  ? ?CBC: ?   ?Component Value Date/Time  ? WBC 10.9 (H) 06/27/2021 7106  ? HGB 9.4 (L) 06/27/2021 2694  ? HGB 9.7 (L) 04/22/2021 1106  ? HCT 28.2 (L) 06/27/2021 8546  ? HCT 30.2 (L) 04/22/2021 1106  ? PLT 374 06/27/2021 0607  ? PLT 529 (H) 04/22/2021 1106  ? MCV 80.3 06/27/2021 0607  ? MCV 90 04/22/2021 1106  ? NEUTROABS 9.6 (H) 06/26/2021 0600  ? NEUTROABS 5.5 04/22/2021 1106  ? LYMPHSABS 1.5 06/26/2021 0600  ? LYMPHSABS 2.0 04/22/2021 1106  ? MONOABS 1.0 06/26/2021 0600  ? EOSABS 0.3 06/26/2021 0600  ? EOSABS 0.2 04/22/2021 1106  ? BASOSABS 0.0 06/26/2021 0600  ? BASOSABS 0.0 04/22/2021 1106  ? ? ?Recent Results (from the past 240 hour(s))  ?Culture, blood (routine x  2)     Status: None (Preliminary result)  ? Collection Time: 06/26/21  4:09 PM  ? Specimen: BLOOD  ?Result Value Ref Range Status  ? Specimen Description   Final  ?  BLOOD BLOOD LEFT FOREARM ?Performed at [facility], [place] [address], [place], [place] [address] ?  ? Special Requests   Final  ?  BOTTLES DRAWN AEROBIC ONLY Blood Culture adequate volume ?Performed at [facility], [place] [address] [place], [place] [address] ?  ? Culture   Final  ?  NO GROWTH < 12 HOURS ?Performed at [facility], [place] [address] [place], [place] [address] ?  ? Report Status PENDING   Incomplete  ?Culture, blood (routine x 2)     Status: None (Preliminary result)  ? Collection Time: 06/26/21  4:31 PM  ? Specimen: BLOOD  ?Result Value Ref Range Status  ? Specimen Description   Final  ?  BLOOD LEFT ANTECUBITAL ?Performed at [facility], [place] [address] [place] [address], [place], [place] [address] ?  ? Special Requests   Final  ?  BOTTLES DRAWN AEROBIC ONLY Blood Culture adequate volume ?Performed at [facility], [place] [address], [place], [place] [address] ?  ? Culture   Final  ?  NO GROWTH < 12 HOURS ?Performed at [facility], [place] [address], [place], [place] [address] ?  ? Report Status PENDING  Incomplete  ? ? ?Studies/Results: ?No results found. ? ?Medications: ?Scheduled Meds: ? folic acid  1 mg Oral Daily  ? gabapentin  300 mg Oral TID  ? HYDROmorphone   Intravenous Q4H  ? hydroxyurea  500 mg Oral BID  ? ketorolac  15 mg Intravenous Q6H  ? morphine  15 mg Oral Q12H  ? OLANZapine  5 mg Oral QHS  ? rivaroxaban  20 mg Oral Q supper  ? senna-docusate  1 tablet Oral BID  ? ?Continuous Infusions: ? sodium chloride 100 mL/hr at 06/27/21 1408  ? ?PRN Meds:.diphenhydrAMINE, mirtazapine, naloxone **AND** sodium chloride flush, ondansetron (ZOFRAN) IV, oxyCODONE-acetaminophen **AND** oxyCODONE, polyethylene glycol ? ?Consultants: ?None ? ?Procedures: ?None ? ?Antibiotics: ?None ? ?Assessment/Plan: ?Principal Problem: ?  Sickle cell anemia with crisis ([place]) ?Active Problems: ?  Protein-calorie malnutrition, severe ([place]) ?  Adjustment disorder with mixed disturbance of emotions and conduct ?  Chronic pain syndrome ?  History of DVT (deep vein thrombosis) ? ?Hb Sickle Cell Disease with Pain crisis: Reduce IVF to 75 mls/hour, continue weight based Dilaudid PCA at current dose setting, continue IV Toradol 15 mg Q 6 H, continue oral home pain medications as ordered.  Monitor vitals very closely, Re-evaluate pain scale regularly, 2 L of Oxygen by [place]. ?Leukocytosis: Chronic.  There is  no clinical indication for antibiotics at this time.  Continue to monitor closely. ?Anemia of Chronic Disease: Hemoglobin dropped a little since on admission but not at threshold for blood transfusion at this time.  We will continue to monitor closely and transfuse as appropriate. ?Chronic pain Syndrome: Continue oral home pain medications. ?History of DVT/PE: Continue Xarelto. ?Adjustment disorder: Patient is calm and is clinically stable at this time.  We will continue to monitor closely.  Continue home medications. ? ?Code Status: Full Code ?Family Communication: N/A ?Disposition Plan: Not yet ready for discharge ? ?Timothy Marks  ?If 7PM-7AM, please contact night-coverage. ? ?06/27/2021, 3:35 PM  LOS: 1 day   ?

## 2021-06-28 DIAGNOSIS — D5700 Hb-SS disease with crisis, unspecified: Secondary | ICD-10-CM | POA: Diagnosis not present

## 2021-06-28 LAB — CBC
HCT: 25.5 % — ABNORMAL LOW (ref 39.0–52.0)
Hemoglobin: 8.6 g/dL — ABNORMAL LOW (ref 13.0–17.0)
MCH: 27.2 pg (ref 26.0–34.0)
MCHC: 33.7 g/dL (ref 30.0–36.0)
MCV: 80.7 fL (ref 80.0–100.0)
Platelets: 375 10*3/uL (ref 150–400)
RBC: 3.16 MIL/uL — ABNORMAL LOW (ref 4.22–5.81)
RDW: 22.2 % — ABNORMAL HIGH (ref 11.5–15.5)
WBC: 11.5 10*3/uL — ABNORMAL HIGH (ref 4.0–10.5)
nRBC: 0.5 % — ABNORMAL HIGH (ref 0.0–0.2)

## 2021-06-28 MED ORDER — HYDROMORPHONE HCL 1 MG/ML IJ SOLN
0.5000 mg | Freq: Once | INTRAMUSCULAR | Status: AC
  Administered 2021-06-28: 0.5 mg via INTRAVENOUS
  Filled 2021-06-28: qty 0.5

## 2021-06-28 NOTE — Progress Notes (Signed)
Timothy Marks was non-compliant with oxygen meter/cannula with PCA pump. The system alarms and has to be restarted every time the cannula is removed from his nose. He has been educated to leave cannula in his nose and it falls out and/or is pulled out constantly. He is belligerent and unwilling to cooperate when interventions are offered. Hospitalist and charge nurse notified. Will continue to monitor and administer scheduled and PRN pain meds as ordered.  ?

## 2021-06-28 NOTE — Progress Notes (Signed)
Subjective: ?Timothy Marks is a 31 year old male with a medical history significant for sickle cell disease, chronic pain syndrome, opiate dependence and tolerance, oppositional defiance, and anemia of chronic disease was admitted for sickle cell pain crisis.  ? ?Patient continues to have significant pain to low back and lower extremities. He says that his pain intensity is 8/10. He denies any chest pain, cough, shortness of breath, nausea, vomiting, or diarrhea.  ? ?Objective: ? ?Vital signs in last 24 hours: ? ?Vitals:  ? 06/28/21 0741 06/28/21 1013 06/28/21 1133 06/28/21 1323  ?BP:  137/71  132/77  ?Pulse:  76  71  ?Resp: '16 16 16 16  '$ ?Temp:  98.7 ?F (37.1 ?C)  98.6 ?F (37 ?C)  ?TempSrc:  Oral  Oral  ?SpO2: 100% 100% 100%   ?Weight:      ?Height:      ? ? ?Intake/Output from previous day: ? ? ?Intake/Output Summary (Last 24 hours) at 06/28/2021 1333 ?Last data filed at 06/28/2021 1133 ?Gross per 24 hour  ?Intake 3101 ml  ?Output 1775 ml  ?Net 1326 ml  ? ? ?Physical Exam: ?General: Alert, awake, oriented x3, in no acute distress.  ?HEENT: [place]/AT [place]ER[place], EOMI ?Neck: Trachea midline,  no masses, no thyromegal,y no JVD, no carotid bruit ?OROPHARYNX:  Moist, No exudate/ erythema/lesions.  ?Heart: Regular rate and rhythm, without murmurs, rubs, gallops, PMI non-displaced, no heaves or thrills on palpation.  ?Lungs: Clear to auscultation, no wheezing or rhonchi noted. No increased vocal fremitus resonant to percussion  ?Abdomen: Soft, nontender, nondistended, positive bowel sounds, no masses no hepatosplenomegaly noted.Marland Kitchen  ?Neuro: No focal neurological deficits noted cranial nerves II through XII grossly intact. DTRs 2+ bilaterally upper and lower extremities. Strength 5 out of 5 in bilateral upper and lower extremities. ?Musculoskeletal: No warm swelling or erythema around joints, no spinal tenderness noted. ?Psychiatric: Patient alert and oriented x3, good insight and cognition, good recent to remote recall. ?Lymph node  survey: No cervical axillary or inguinal lymphadenopathy noted. ? ?Lab Results: ? ?Basic Metabolic Panel: ?   ?Component Value Date/Time  ? NA 138 06/26/2021 0600  ? NA 139 04/22/2021 1106  ? K 3.3 (L) 06/26/2021 0600  ? CL 103 06/26/2021 0600  ? CO2 27 06/26/2021 0600  ? BUN 6 06/26/2021 0600  ? BUN 6 04/22/2021 1106  ? CREATININE 0.54 (L) 06/26/2021 0600  ? GLUCOSE 102 (H) 06/26/2021 0600  ? CALCIUM 9.2 06/26/2021 0600  ? ?CBC: ?   ?Component Value Date/Time  ? WBC 11.5 (H) 06/28/2021 0524  ? HGB 8.6 (L) 06/28/2021 0524  ? HGB 9.7 (L) 04/22/2021 1106  ? HCT 25.5 (L) 06/28/2021 0524  ? HCT 30.2 (L) 04/22/2021 1106  ? PLT 375 06/28/2021 0524  ? PLT 529 (H) 04/22/2021 1106  ? MCV 80.7 06/28/2021 0524  ? MCV 90 04/22/2021 1106  ? NEUTROABS 9.6 (H) 06/26/2021 0600  ? NEUTROABS 5.5 04/22/2021 1106  ? LYMPHSABS 1.5 06/26/2021 0600  ? LYMPHSABS 2.0 04/22/2021 1106  ? MONOABS 1.0 06/26/2021 0600  ? EOSABS 0.3 06/26/2021 0600  ? EOSABS 0.2 04/22/2021 1106  ? BASOSABS 0.0 06/26/2021 0600  ? BASOSABS 0.0 04/22/2021 1106  ? ? ?Recent Results (from the past 240 hour(s))  ?Culture, blood (routine x 2)     Status: None (Preliminary result)  ? Collection Time: 06/26/21  4:09 PM  ? Specimen: BLOOD  ?Result Value Ref Range Status  ? Specimen Description   Final  ?  BLOOD BLOOD LEFT  FOREARM ?Performed at [facility], [place] [address] [place], [place] [address] ?  ? Special Requests   Final  ?  BOTTLES DRAWN AEROBIC ONLY Blood Culture adequate volume ?Performed at [facility], [place] [address] [place], [place] [address] ?  ? Culture   Final  ?  NO GROWTH 2 DAYS ?Performed at [facility], [place] [address], [place], [place] [address] ?  ? Report Status PENDING  Incomplete  ?Culture, blood (routine x 2)     Status: None (Preliminary result)  ? Collection Time: 06/26/21  4:31 PM  ? Specimen: BLOOD  ?Result Value Ref Range Status  ? Specimen Description   Final  ?  BLOOD LEFT  ANTECUBITAL ?Performed at [facility], [place] [address], [place], [place] [address] ?  ? Special Requests   Final  ?  BOTTLES DRAWN AEROBIC ONLY Blood Culture adequate volume ?Performed at [facility], [place] [name], [place], [place] [address] ?  ? Culture   Final  ?  NO GROWTH 2 DAYS ?Performed at [facility], [place] [address], [place], [place] [address] ?  ? Report Status PENDING  Incomplete  ? ? ?Studies/Results: ?No results found. ? ?Medications: ?Scheduled Meds: ? folic acid  1 mg Oral Daily  ? gabapentin  300 mg Oral TID  ? HYDROmorphone   Intravenous Q4H  ? hydroxyurea  500 mg Oral BID  ? ketorolac  15 mg Intravenous Q6H  ? morphine  15 mg Oral Q12H  ? OLANZapine  5 mg Oral QHS  ? rivaroxaban  20 mg Oral Q supper  ? senna-docusate  1 tablet Oral BID  ? ?Continuous Infusions: ? sodium chloride 100 mL/hr at 06/28/21 1194  ? ?PRN Meds:.diphenhydrAMINE, mirtazapine, naloxone **AND** sodium chloride flush, ondansetron (ZOFRAN) IV, oxyCODONE-acetaminophen **AND** oxyCODONE, polyethylene glycol ? ?Consultants: ?None ? ?Procedures: ?None ? ?Antibiotics: ?None ? ?Assessment/Plan: ?Principal Problem: ?  Sickle cell anemia with crisis ([place]) ?Active Problems: ?  Protein-calorie malnutrition, severe ([place]) ?  Adjustment disorder with mixed disturbance of emotions and conduct ?  Chronic pain syndrome ?  History of DVT (deep vein thrombosis) ? ?Sickle cell disease pain crisis:  ?Decrease fluids to KVO. Continue IV dilaudid PCA. Toradol 15 mg every 6 hours for a total of 5 days ?Continue home medications ?Monitor vital signs closely, reevaluate pain scale regularly, and supplemental oxygen as needed.  ? ?Leukocytosis: ?Chronic. No signs of infection or inflammation.  No clinical indication for antibiotics at this time.  Monitor closely. ? ?Anemia of chronic disease: ?Hemoglobin is stable and slightly below patient's baseline.  Continue to monitor closely.  No transfusion warranted  at this juncture. ? ?Chronic pain syndrome: ?Continue home medications ? ?History of DVT/PE: ?Continue Xarelto ? ?Adjustment disorder: ?Continue home medications.  Monitor closely.   ? ? ?Code Status: Full Code ?Family Communication: N/A ?Disposition Plan: Not yet ready for discharge ?[name]  APRN, MSN, FNP-C ?Patient [place] ?[facility] ?[address]  ?[place], [place] [address] ?[phone] ? ?If 7PM-7AM, please contact night-coverage. ? ?06/28/2021, 1:33 PM  LOS: 2 days   ?

## 2021-06-28 NOTE — Consult Note (Signed)
[facility] CM Inpatient Consult ? ? ?06/28/2021 ? ?Timothy Marks ?[birth year] ?[id number] ? ?[place] Management [facility] CM) ?  ?Patient chart has been reviewed with noted high risk score for unplanned readmissions.  Patient assessed for community [place] [facility] follow up needs. Spoke with patient bedside. Patient states that he transports himself to medical appointments. Denies need for housing or medication assistance at this time. Patient states that he sees has appointments with primary provider every 3 months. ?  ?Of note, [facility] Care Management services does not replace or interfere with any services that are arranged by inpatient case management or social work.  ?  ?Netta Cedars, MSN, RN ?[facility] Liaison ?Phone [phone] ?Toll free office [phone]   ? ?

## 2021-06-28 NOTE — Plan of Care (Signed)
?  Problem: Clinical Measurements: ?Goal: Will remain free from infection ?Outcome: Progressing ?  ?

## 2021-06-29 DIAGNOSIS — D5700 Hb-SS disease with crisis, unspecified: Secondary | ICD-10-CM | POA: Diagnosis not present

## 2021-06-29 LAB — COMPREHENSIVE METABOLIC PANEL
ALT: 19 U/L (ref 0–44)
AST: 23 U/L (ref 15–41)
Albumin: 3.4 g/dL — ABNORMAL LOW (ref 3.5–5.0)
Alkaline Phosphatase: 83 U/L (ref 38–126)
Anion gap: 7 (ref 5–15)
BUN: 9 mg/dL (ref 6–20)
CO2: 27 mmol/L (ref 22–32)
Calcium: 8.6 mg/dL — ABNORMAL LOW (ref 8.9–10.3)
Chloride: 106 mmol/L (ref 98–111)
Creatinine, Ser: 0.56 mg/dL — ABNORMAL LOW (ref 0.61–1.24)
GFR, Estimated: >60 mL/min (ref >60)
Glucose, Bld: 98 mg/dL (ref 70–99)
Potassium: 3.4 mmol/L — ABNORMAL LOW (ref 3.5–5.1)
Sodium: 140 mmol/L (ref 135–145)
Total Bilirubin: 3.3 mg/dL — ABNORMAL HIGH (ref 0.3–1.2)
Total Protein: 6.8 g/dL (ref 6.5–8.1)

## 2021-06-29 LAB — CBC
HCT: 24.3 % — ABNORMAL LOW (ref 39.0–52.0)
Hemoglobin: 8.3 g/dL — ABNORMAL LOW (ref 13.0–17.0)
MCH: 26.7 pg (ref 26.0–34.0)
MCHC: 34.2 g/dL (ref 30.0–36.0)
MCV: 78.1 fL — ABNORMAL LOW (ref 80.0–100.0)
Platelets: 399 10*3/uL (ref 150–400)
RBC: 3.11 MIL/uL — ABNORMAL LOW (ref 4.22–5.81)
RDW: 21.9 % — ABNORMAL HIGH (ref 11.5–15.5)
WBC: 12.6 10*3/uL — ABNORMAL HIGH (ref 4.0–10.5)
nRBC: 0.3 % — ABNORMAL HIGH (ref 0.0–0.2)

## 2021-06-29 MED ORDER — OXYCODONE HCL 5 MG PO TABS
5.0000 mg | ORAL_TABLET | ORAL | Status: DC | PRN
Start: 2021-06-29 — End: 2021-07-05
  Administered 2021-06-29 – 2021-07-04 (×15): 5 mg via ORAL
  Filled 2021-06-29 (×15): qty 1

## 2021-06-29 MED ORDER — OXYCODONE-ACETAMINOPHEN 5-325 MG PO TABS
1.0000 | ORAL_TABLET | ORAL | Status: DC | PRN
  Administered 2021-06-29 – 2021-07-04 (×16): 1 via ORAL
  Filled 2021-06-29 (×16): qty 1

## 2021-06-29 MED ORDER — HYDROMORPHONE 1 MG/ML IV SOLN
INTRAVENOUS | Status: DC
  Administered 2021-06-29: 2 mg via INTRAVENOUS
  Administered 2021-06-30: 3.5 mg via INTRAVENOUS
  Administered 2021-06-30: 1.5 mg via INTRAVENOUS
  Administered 2021-06-30: 0.5 mg via INTRAVENOUS
  Administered 2021-06-30: 2 mg via INTRAVENOUS

## 2021-06-29 NOTE — Plan of Care (Signed)
  Problem: Pain Managment: Goal: General experience of comfort will improve Outcome: Progressing   

## 2021-06-29 NOTE — Progress Notes (Signed)
Subjective: [name] is a 31 year old male with a medical history significant for sickle cell disease, chronic pain syndrome, opiate dependence and tolerance, oppositional defiance, and anemia of chronic disease was admitted for sickle cell pain crisis.  ? ?No new complaints. Patient appears to be resting comfortably. Patient continues to have  pain to low back and lower extremities. He says that his pain intensity is 8/10. He denies any chest pain, cough, shortness of breath, nausea, vomiting, or diarrhea.  ? ?Objective: ? ?Vital signs in last 24 hours: ? ?Vitals:  ? 06/29/21 0004 06/29/21 0403 06/29/21 0504 06/29/21 0715  ?BP:   131/65   ?Pulse:   89   ?Resp: '13 13 20 18  '$ ?Temp:   99.7 ?F (37.6 ?C)   ?TempSrc:   Oral   ?SpO2: 99% 100% 95% 99%  ?Weight:      ?Height:      ? ? ?Intake/Output from previous day: ? ? ?Intake/Output Summary (Last 24 hours) at 06/29/2021 0947 ?Last data filed at 06/29/2021 0900 ?Gross per 24 hour  ?Intake 2351.28 ml  ?Output 2950 ml  ?Net -598.72 ml  ? ? ?Physical Exam: ?General: Alert, awake, oriented x3, in no acute distress.  ?HEENT: [place]/AT [place], EOMI ?Neck: Trachea midline,  no masses, no thyromegal,y no JVD, no carotid bruit ?OROPHARYNX:  Moist, No exudate/ erythema/lesions.  ?Heart: Regular rate and rhythm, without murmurs, rubs, gallops, PMI non-displaced, no heaves or thrills on palpation.  ?Lungs: Clear to auscultation, no wheezing or rhonchi noted. No increased vocal fremitus resonant to percussion  ?Abdomen: Soft, nontender, nondistended, positive bowel sounds, no masses no hepatosplenomegaly noted.Marland Kitchen  ?Neuro: No focal neurological deficits noted cranial nerves II through XII grossly intact. DTRs 2+ bilaterally upper and lower extremities. Strength 5 out of 5 in bilateral upper and lower extremities. ?Musculoskeletal: No warm swelling or erythema around joints, no spinal tenderness noted. ?Psychiatric: Patient alert and oriented x3, good insight and cognition, good recent  to remote recall. ?Lymph node survey: No cervical axillary or inguinal lymphadenopathy noted. ? ?Lab Results: ? ?Basic Metabolic Panel: ?   ?Component Value Date/Time  ? NA 140 06/29/2021 0601  ? NA 139 04/22/2021 1106  ? K 3.4 (L) 06/29/2021 0601  ? CL 106 06/29/2021 0601  ? CO2 27 06/29/2021 0601  ? BUN 9 06/29/2021 0601  ? BUN 6 04/22/2021 1106  ? CREATININE 0.56 (L) 06/29/2021 0601  ? GLUCOSE 98 06/29/2021 0601  ? CALCIUM 8.6 (L) 06/29/2021 0601  ? ?CBC: ?   ?Component Value Date/Time  ? WBC 12.6 (H) 06/29/2021 0601  ? HGB 8.3 (L) 06/29/2021 0601  ? HGB 9.7 (L) 04/22/2021 1106  ? HCT 24.3 (L) 06/29/2021 0601  ? HCT 30.2 (L) 04/22/2021 1106  ? PLT 399 06/29/2021 0601  ? PLT 529 (H) 04/22/2021 1106  ? MCV 78.1 (L) 06/29/2021 0601  ? MCV 90 04/22/2021 1106  ? NEUTROABS 9.6 (H) 06/26/2021 0600  ? NEUTROABS 5.5 04/22/2021 1106  ? LYMPHSABS 1.5 06/26/2021 0600  ? LYMPHSABS 2.0 04/22/2021 1106  ? MONOABS 1.0 06/26/2021 0600  ? EOSABS 0.3 06/26/2021 0600  ? EOSABS 0.2 04/22/2021 1106  ? BASOSABS 0.0 06/26/2021 0600  ? BASOSABS 0.0 04/22/2021 1106  ? ? ?Recent Results (from the past 240 hour(s))  ?Culture, blood (routine x 2)     Status: None (Preliminary result)  ? Collection Time: 06/26/21  4:09 PM  ? Specimen: BLOOD  ?Result Value Ref [place] Status  ? Specimen Description  Final  ?  BLOOD BLOOD LEFT FOREARM ?Performed at [facility], [place] [address], [place], [place] [address] ?  ? Special Requests   Final  ?  BOTTLES DRAWN AEROBIC ONLY Blood Culture adequate volume ?Performed at [facility], [place] [address], [place], [place] [address] ?  ? Culture   Final  ?  NO GROWTH 3 DAYS ?Performed at [facility], [place] [address] [place], [place] [address] ?  ? Report Status PENDING  Incomplete  ?Culture, blood (routine x 2)     Status: None (Preliminary result)  ? Collection Time: 06/26/21  4:31 PM  ? Specimen: BLOOD  ?Result Value Ref Range Status  ? Specimen Description   Final  ?   BLOOD LEFT ANTECUBITAL ?Performed at [facility], [place] [address], [place], [place] [address] ?  ? Special Requests   Final  ?  BOTTLES DRAWN AEROBIC ONLY Blood Culture adequate volume ?Performed at [facility], [place] [address] [place], [place] [address] ?  ? Culture   Final  ?  NO GROWTH 3 DAYS ?Performed at [facility], [place] [address] [place], [place] [address] ?  ? Report Status PENDING  Incomplete  ? ? ?Studies/Results: ?No results found. ? ?Medications: ?Scheduled Meds: ? folic acid  1 mg Oral Daily  ? gabapentin  300 mg Oral TID  ? HYDROmorphone   Intravenous Q4H  ? hydroxyurea  500 mg Oral BID  ? ketorolac  15 mg Intravenous Q6H  ? morphine  15 mg Oral Q12H  ? OLANZapine  5 mg Oral QHS  ? rivaroxaban  20 mg Oral Q supper  ? senna-docusate  1 tablet Oral BID  ? ?Continuous Infusions: ? sodium chloride 10 mL/hr at 06/29/21 5784  ? ?PRN Meds:.diphenhydrAMINE, mirtazapine, naloxone **AND** sodium chloride flush, ondansetron (ZOFRAN) IV, oxyCODONE-acetaminophen **AND** oxyCODONE, polyethylene glycol ? ?Consultants: ?None ? ?Procedures: ?None ? ?Antibiotics: ?None ? ?Assessment/Plan: ?Principal Problem: ?  Sickle cell anemia with crisis ([place]) ?Active Problems: ?  Protein-calorie malnutrition, severe ([place]) ?  Adjustment disorder with mixed disturbance of emotions and conduct ?  Chronic pain syndrome ?  History of DVT (deep vein thrombosis) ? ?Sickle cell disease pain crisis:  ?Weaning IV dilaudid PCA. ?Toradol 15 mg every 6 hours for a total of 5 days ?Continue home medications ?Monitor vital signs closely, reevaluate pain scale regularly, and supplemental oxygen as needed.  ?Ambulate in halls ?Discharge planned for  ? ?Leukocytosis: ?Chronic. No signs of infection or inflammation.  No clinical indication for antibiotics at this time.  Monitor closely. ? ?Anemia of chronic disease: ?Stable.  Continue to monitor closely. Labs in am.  No transfusion warranted at this  juncture. ? ?Chronic pain syndrome: ?Continue home medications ? ?History of DVT/PE: ?Continue Xarelto ? ?Adjustment disorder: ?Continue home medications.  Monitor closely.   ? ? ?Code Status: Full Code ?Family Communication: N/A ?Disposition Plan: Not yet ready for discharge ?[name]  APRN, MSN, FNP-C ?Patient [place] ?[facility] ?[address]  ?[place], [place] [address] ?[phone] ? ?If 7PM-7AM, please contact night-coverage. ? ?06/29/2021, 9:47 AM  LOS: 3 days   ?

## 2021-06-29 NOTE — Progress Notes (Signed)
24 hour chart audit completed 

## 2021-06-30 DIAGNOSIS — D5700 Hb-SS disease with crisis, unspecified: Secondary | ICD-10-CM | POA: Diagnosis not present

## 2021-06-30 LAB — CBC WITH DIFFERENTIAL/PLATELET
Abs Immature Granulocytes: 0.05 10*3/uL (ref 0.00–0.07)
Basophils Absolute: 0 10*3/uL (ref 0.0–0.1)
Basophils Relative: 0 %
Eosinophils Absolute: 0.4 10*3/uL (ref 0.0–0.5)
Eosinophils Relative: 4 %
HCT: 22.4 % — ABNORMAL LOW (ref 39.0–52.0)
Hemoglobin: 7.4 g/dL — ABNORMAL LOW (ref 13.0–17.0)
Immature Granulocytes: 0 %
Lymphocytes Relative: 19 %
Lymphs Abs: 2.1 10*3/uL (ref 0.7–4.0)
MCH: 26 pg (ref 26.0–34.0)
MCHC: 33 g/dL (ref 30.0–36.0)
MCV: 78.6 fL — ABNORMAL LOW (ref 80.0–100.0)
Monocytes Absolute: 0.6 10*3/uL (ref 0.1–1.0)
Monocytes Relative: 6 %
Neutro Abs: 8 10*3/uL — ABNORMAL HIGH (ref 1.7–7.7)
Neutrophils Relative %: 71 %
Platelets: 421 10*3/uL — ABNORMAL HIGH (ref 150–400)
RBC: 2.85 MIL/uL — ABNORMAL LOW (ref 4.22–5.81)
RDW: 22.1 % — ABNORMAL HIGH (ref 11.5–15.5)
WBC: 11.2 10*3/uL — ABNORMAL HIGH (ref 4.0–10.5)
nRBC: 0.3 % — ABNORMAL HIGH (ref 0.0–0.2)

## 2021-06-30 LAB — LACTATE DEHYDROGENASE: LDH: 233 U/L — ABNORMAL HIGH (ref 98–192)

## 2021-06-30 MED ORDER — HYDROMORPHONE 1 MG/ML IV SOLN
INTRAVENOUS | Status: DC
  Administered 2021-06-30: 1.5 mg via INTRAVENOUS
  Administered 2021-06-30: 6 mg via INTRAVENOUS
  Administered 2021-07-01: 7.5 mg via INTRAVENOUS
  Administered 2021-07-01: 5 mg via INTRAVENOUS
  Administered 2021-07-01: 2.5 mg via INTRAVENOUS
  Administered 2021-07-01: 30 mg via INTRAVENOUS
  Administered 2021-07-01: 2 mg via INTRAVENOUS
  Administered 2021-07-01: 2.5 mg via INTRAVENOUS
  Administered 2021-07-02: 0.5 mg via INTRAVENOUS
  Administered 2021-07-02: 30 mg via INTRAVENOUS
  Administered 2021-07-02: 4 mg via INTRAVENOUS
  Administered 2021-07-02: 4.5 mg via INTRAVENOUS
  Administered 2021-07-02: 4 mg via INTRAVENOUS
  Administered 2021-07-02: 9.5 mg via INTRAVENOUS
  Administered 2021-07-03: 1 mg via INTRAVENOUS
  Administered 2021-07-03: 30 mg via INTRAVENOUS
  Administered 2021-07-03: 2.5 mg via INTRAVENOUS
  Administered 2021-07-04: 0.5 mg via INTRAVENOUS
  Administered 2021-07-04: 5 mg via INTRAVENOUS
  Administered 2021-07-04: 4 mg via INTRAVENOUS
  Filled 2021-06-30 (×3): qty 30

## 2021-06-30 NOTE — TOC Initial Note (Signed)
Transition of Care (TOC) - Initial/Assessment Note  ? ? ?Patient Details  ?Name: Timothy Marks ?MRN: [id number] ?Date of Birth: January 24, 1991 ? ?Transition of Care (TOC) CM/SW Contact:    ?Tandre Conly, Marjie Skiff, RN ?Phone Number: ?06/30/2021, 10:45 AM ? ?Clinical Narrative:                 ? ? ?Expected Discharge Plan: Home/Self Care ?Barriers to Discharge: Continued Medical Work up ? ? ?Patient Goals and CMS Choice ?Patient states their goals for this hospitalization and ongoing recovery are:: To go home ?  ?  ? ?Expected Discharge Plan and Services ?Expected Discharge Plan: Home/Self Care ?  ?Discharge Planning Services: CM Consult ?  ?Living arrangements for the past 2 months: Apartment ?        ?Prior Living Arrangements/Services ?Living arrangements for the past 2 months: Apartment ?  ?  ?       ?Need for Family Participation in Patient Care: No (Comment) ?  ?  ?Criminal Activity/Legal Involvement Pertinent to Current Situation/Hospitalization: No - Comment as needed ? ?   ? ?Emotional Assessment ?Appearance:: Appears stated age ?Attitude/Demeanor/Rapport: Self-Confident ?Affect (typically observed): Calm ?Orientation: : Oriented to Self, Oriented to Place, Oriented to  Time, Oriented to Situation ?Alcohol / Substance Use: Not Applicable ?Psych Involvement: No (comment) ? ?Admission diagnosis:  Sickle cell anemia with crisis ([place]) [D57.00] ?Sickle cell pain crisis ([place]) [D57.00] ?Patient Active Problem List  ? Diagnosis Date Noted  ? Marijuana smoker 01/27/2021  ? Lab test positive for detection of COVID-19 virus 01/23/2020  ? Medication management 01/16/2020  ? Underweight 06/14/2019  ? Generalized weakness 03/31/2019  ? Sickle-cell crisis ([place]) 03/24/2019  ? SIRS (systemic inflammatory response syndrome) ([place]) 02/10/2019  ? Abnormal liver function 11/04/2018  ? Sickle cell anemia with crisis ([place]) 05/22/2018  ? Narcotic dependence ([place]) 02/10/2018  ? Acute bilateral low back pain without sciatica   ? Other chronic pain    ? Anemia   ? Socially inappropriate behavior 12/02/2017  ? Thrombocythemia   ? Physical deconditioning   ? Agitation   ? Chronic pain syndrome   ? Recurrent cold sores   ? Fever of unknown origin   ? Drug-seeking behavior 08/30/2017  ? Pain   ? Sickle cell crisis ([place]) 07/16/2017  ? Adjustment disorder with mixed disturbance of emotions and conduct   ? Bilateral pulmonary infiltrates on chest x-ray 11/21/2016  ? History of DVT (deep vein thrombosis) 11/17/2016  ? Suicidal ideations 10/11/2016  ? Reticulocytosis 10/09/2016  ? DVT (deep venous thrombosis) ([place]) 08/02/2016  ? Has difficulty accessing primary care provider for most visits 07/11/2015  ? 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 Hordeolum externum of left upper eyelid 05/31/2015  ? Major depressive disorder with current active episode 11/09/2014  ? 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 Pneumonia of right upper lobe due to infectious organism 11/07/2014  ? Constipation 08/03/2014  ? Leukocytosis 06/30/2014  ? h/o Priapism 05/06/2014  ? Protein-calorie malnutrition, severe ([place]) 04/09/2014  ? 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)   ? Vitamin D deficiency   ? Sickle cell anemia ([place]) 03/30/2014  ? Hyperbilirubinemia 03/30/2014  ? Hypokalemia 02/07/2014  ? Mandible fracture ([place]) 05/03/2013  ? Aggressive behavior 01/25/2013  ? 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 Sickle cell anemia with pain ([place]) 01/24/2013  ? Sickle cell pain crisis ([place]) 01/24/2013  ? 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 ?PCP:  Tresa Garter, MD ?Pharmacy:   ?[facility] [place], [place] ?[place] [address] ?Phone: [phone] Fax: [phone] ? ? ? ? ?Social Determinants of  Health (SDOH) Interventions ?Food Insecurity Interventions: Intervention Not Indicated ?Transportation Interventions: Intervention Not Indicated (Patient states that he takes himself to appointments) ? ?Readmission Risk Interventions ? ?  06/30/2021  ? 10:44 AM 12/01/2020  ?  1:16 PM  ?Readmission Risk Prevention Plan  ?Transportation Screening Complete Complete  ?PCP or Specialist Appt within 3-5 Days Complete Complete  ?[place] or Home Care Consult Complete Complete  ?Social Work Consult for [place] Planning/Counseling Complete Complete  ?Palliative Care Screening Not Applicable Not Applicable  ?Medication Review Press photographer) Complete Complete  ? ? ? ?

## 2021-06-30 NOTE — Progress Notes (Signed)
24 hour chart audit completed 

## 2021-06-30 NOTE — Progress Notes (Signed)
Subjective: Timothy Marks is a 31 year old male with a medical history significant for sickle cell disease, chronic pain syndrome, opiate dependence and tolerance, oppositional defiance, and anemia of chronic disease was admitted for sickle cell pain crisis.   Patient continues to have  pain to low back and lower extremities. He says that his pain intensity is 8/10. He denies any chest pain, cough, shortness of breath, nausea, vomiting, or diarrhea.   Objective:  Vital signs in last 24 hours:  Vitals:   06/30/21 0615 06/30/21 0836 06/30/21 1000 06/30/21 1434  BP: 124/70  122/76   Pulse: 93  91   Resp:  '19 18 12  '$ Temp: 98.7 F (37.1 C)  98.9 F (37.2 C)   TempSrc: Oral  Oral   SpO2: 96% 98% 98% 99%  Weight:      Height:        Intake/Output from previous day:   Intake/Output Summary (Last 24 hours) at 06/30/2021 1715 Last data filed at 06/30/2021 1437 Gross per 24 hour  Intake 325.08 ml  Output 600 ml  Net -274.92 ml    Physical Exam: General: Alert, awake, oriented x3, in no acute distress.  HEENT: [place]/AT PEERL, EOMI Neck: Trachea midline,  no masses, no thyromegal,y no JVD, no carotid bruit OROPHARYNX:  Moist, No exudate/ erythema/lesions.  Heart: Regular rate and rhythm, without murmurs, rubs, gallops, PMI non-displaced, no heaves or thrills on palpation.  Lungs: Clear to auscultation, no wheezing or rhonchi noted. No increased vocal fremitus resonant to percussion  Abdomen: Soft, nontender, nondistended, positive bowel sounds, no masses no hepatosplenomegaly noted..  Neuro: No focal neurological deficits noted cranial nerves II through XII grossly intact. DTRs 2+ bilaterally upper and lower extremities. Strength 5 out of 5 in bilateral upper and lower extremities. Musculoskeletal: No warm swelling or erythema around joints, no spinal tenderness noted. Psychiatric: Patient alert and oriented x3, good insight and cognition, good recent to remote recall. Lymph node survey:  No cervical axillary or inguinal lymphadenopathy noted.  Lab Results:  Basic Metabolic Panel:    Component Value Date/Time   NA 140 06/29/2021 0601   NA 139 04/22/2021 1106   K 3.4 (L) 06/29/2021 0601   CL 106 06/29/2021 0601   CO2 27 06/29/2021 0601   BUN 9 06/29/2021 0601   BUN 6 04/22/2021 1106   CREATININE 0.56 (L) 06/29/2021 0601   GLUCOSE 98 06/29/2021 0601   CALCIUM 8.6 (L) 06/29/2021 0601   CBC:    Component Value Date/Time   WBC 11.2 (H) 06/30/2021 1025   HGB 7.4 (L) 06/30/2021 1025   HGB 9.7 (L) 04/22/2021 1106   HCT 22.4 (L) 06/30/2021 1025   HCT 30.2 (L) 04/22/2021 1106   PLT 421 (H) 06/30/2021 1025   PLT 529 (H) 04/22/2021 1106   MCV 78.6 (L) 06/30/2021 1025   MCV 90 04/22/2021 1106   NEUTROABS 8.0 (H) 06/30/2021 1025   NEUTROABS 5.5 04/22/2021 1106   LYMPHSABS 2.1 06/30/2021 1025   LYMPHSABS 2.0 04/22/2021 1106   MONOABS 0.6 06/30/2021 1025   EOSABS 0.4 06/30/2021 1025   EOSABS 0.2 04/22/2021 1106   BASOSABS 0.0 06/30/2021 1025   BASOSABS 0.0 04/22/2021 1106    Recent Results (from the past 240 hour(s))  Culture, blood (routine x 2)     Status: None (Preliminary result)   Collection Time: 06/26/21  4:09 PM   Specimen: BLOOD  Result Value Ref Range Status   Specimen Description   Final    BLOOD  BLOOD LEFT FOREARM Performed at [facility] [address] [place], [place] [address]    Special Requests   Final    BOTTLES DRAWN AEROBIC ONLY Blood Culture adequate volume Performed at [facility] [address], [place], [place] [address]    Culture   Final    NO GROWTH 4 DAYS Performed at [facility], [place] [address], [place], [place] [address]    Report Status PENDING  Incomplete  Culture, blood (routine x 2)     Status: None (Preliminary result)   Collection Time: 06/26/21  4:31 PM   Specimen: BLOOD  Result Value Ref Range Status   Specimen Description   Final    BLOOD LEFT ANTECUBITAL Performed  at [facility] [address], [place], [place] [address]    Special Requests   Final    BOTTLES DRAWN AEROBIC ONLY Blood Culture adequate volume Performed at [facility] [address], [place], [place] [address]    Culture   Final    NO GROWTH 4 DAYS Performed at [facility], [place] [address], [place], [place] [address]    Report Status PENDING  Incomplete    Studies/Results: No results found.  Medications: Scheduled Meds:  folic acid  1 mg Oral Daily   gabapentin  300 mg Oral TID   HYDROmorphone   Intravenous Q4H   hydroxyurea  500 mg Oral BID   ketorolac  15 mg Intravenous Q6H   morphine  15 mg Oral Q12H   OLANZapine  5 mg Oral QHS   rivaroxaban  20 mg Oral Q supper   senna-docusate  1 tablet Oral BID   Continuous Infusions:  sodium chloride 10 mL/hr at 06/30/21 1437   PRN Meds:.diphenhydrAMINE, mirtazapine, naloxone **AND** sodium chloride flush, ondansetron (ZOFRAN) IV, oxyCODONE-acetaminophen **AND** oxyCODONE, polyethylene glycol  Consultants: None  Procedures: None  Antibiotics: None  Assessment/Plan: Principal Problem:   Sickle cell anemia with crisis ([place]) Active Problems:   Protein-calorie malnutrition, severe (HCC)   Adjustment disorder with mixed disturbance of emotions and conduct   Chronic pain syndrome   History of DVT (deep vein thrombosis)  Sickle cell disease pain crisis:  Weaning IV dilaudid PCA. Toradol 15 mg every 6 hours for a total of 5 days Continue home medications Monitor vital signs closely, reevaluate pain scale regularly, and supplemental oxygen as needed.  Ambulate in halls Discharge planned for tomorrow.  Leukocytosis: Chronic. No signs of infection or inflammation.  No clinical indication for antibiotics at this time.  Monitor closely.  Anemia of chronic disease: Stable.  Continue to monitor closely. Labs in am.  No transfusion warranted at this juncture.  Chronic pain  syndrome: Continue home medications  History of DVT/PE: Continue Xarelto  Adjustment disorder: Continue home medications.  Monitor closely.     Code Status: Full Code Family Communication: N/A Disposition Plan: Not yet ready for discharge [place], MSN, FNP-C Patient [facility] [address] [place], [place] [address] [phone]  If 7PM-7AM, please contact night-coverage.  06/30/2021, 5:15 PM  LOS: 4 days

## 2021-06-30 NOTE — Care Management Important Message (Signed)
Important Message ? ?Patient Details IM Letter given to the Patient. ?Name: Timothy Marks ?MRN: [id number] ?Date of Birth: [birth year] ? ? ?Medicare Important Message Given:  Yes ? ? ? ? ?Kerin Salen ?06/30/2021, 8:50 AM ?

## 2021-07-01 DIAGNOSIS — D5700 Hb-SS disease with crisis, unspecified: Secondary | ICD-10-CM | POA: Diagnosis not present

## 2021-07-01 LAB — CBC
HCT: 19.6 % — ABNORMAL LOW (ref 39.0–52.0)
Hemoglobin: 6.7 g/dL — CL (ref 13.0–17.0)
MCH: 26.7 pg (ref 26.0–34.0)
MCHC: 34.2 g/dL (ref 30.0–36.0)
MCV: 78.1 fL — ABNORMAL LOW (ref 80.0–100.0)
Platelets: 460 10*3/uL — ABNORMAL HIGH (ref 150–400)
RBC: 2.51 MIL/uL — ABNORMAL LOW (ref 4.22–5.81)
RDW: 22.6 % — ABNORMAL HIGH (ref 11.5–15.5)
WBC: 14.1 10*3/uL — ABNORMAL HIGH (ref 4.0–10.5)
nRBC: 0.8 % — ABNORMAL HIGH (ref 0.0–0.2)

## 2021-07-01 LAB — CULTURE, BLOOD (ROUTINE X 2)
Culture: NO GROWTH
Culture: NO GROWTH
Special Requests: ADEQUATE
Special Requests: ADEQUATE

## 2021-07-01 LAB — PREPARE RBC (CROSSMATCH)

## 2021-07-01 LAB — HEMOGLOBIN AND HEMATOCRIT, BLOOD
HCT: 24.2 % — ABNORMAL LOW (ref 39.0–52.0)
Hemoglobin: 8.7 g/dL — ABNORMAL LOW (ref 13.0–17.0)

## 2021-07-01 MED ORDER — DIPHENHYDRAMINE HCL 50 MG/ML IJ SOLN
25.0000 mg | Freq: Once | INTRAMUSCULAR | Status: AC
  Administered 2021-07-01: 25 mg via INTRAVENOUS
  Filled 2021-07-01: qty 1

## 2021-07-01 MED ORDER — HYDROMORPHONE HCL 2 MG/ML IJ SOLN
2.0000 mg | Freq: Once | INTRAMUSCULAR | Status: AC
  Administered 2021-07-01: 2 mg via INTRAVENOUS
  Filled 2021-07-01: qty 1

## 2021-07-01 MED ORDER — ACETAMINOPHEN 325 MG PO TABS
650.0000 mg | ORAL_TABLET | Freq: Once | ORAL | Status: AC
  Administered 2021-07-01: 650 mg via ORAL
  Filled 2021-07-01: qty 2

## 2021-07-01 NOTE — Plan of Care (Signed)
  Problem: Elimination: Goal: Will not experience complications related to bowel motility Outcome: Not Progressing   

## 2021-07-01 NOTE — Progress Notes (Signed)
Subjective: Timothy Marks is a 31 year old male with a medical history significant for sickle cell disease, chronic pain syndrome, opiate dependence and tolerance, oppositional defiance, and anemia of chronic disease was admitted for sickle cell pain crisis.   Today, patient's hemoglobin is 6.7 g/dL, which is below his baseline of 8-9 g/dL.  Patient continues to complain of allover body pain, he rates pain as 8/10.  He denies any chest pain, cough, shortness of breath, nausea, vomiting, or diarrhea.   Objective:  Vital signs in last 24 hours:  Vitals:   07/01/21 0146 07/01/21 0403 07/01/21 0618 07/01/21 0740  BP: 135/80  131/68   Pulse: 98  92   Resp: '16 13 18 15  '$ Temp: 98.4 F (36.9 C)  98.3 F (36.8 C)   TempSrc: Oral  Oral   SpO2: 96% 95% 95% 98%  Weight:      Height:        Intake/Output from previous day:   Intake/Output Summary (Last 24 hours) at 07/01/2021 1105 Last data filed at 07/01/2021 1004 Gross per 24 hour  Intake 1095.65 ml  Output 650 ml  Net 445.65 ml    Physical Exam: General: Alert, awake, oriented x3, in no acute distress.  HEENT: [place]/AT PEERL, EOMI Neck: Trachea midline,  no masses, no thyromegal,y no JVD, no carotid bruit OROPHARYNX:  Moist, No exudate/ erythema/lesions.  Heart: Regular rate and rhythm, without murmurs, rubs, gallops, PMI non-displaced, no heaves or thrills on palpation.  Lungs: Clear to auscultation, no wheezing or rhonchi noted. No increased vocal fremitus resonant to percussion  Abdomen: Soft, nontender, nondistended, positive bowel sounds, no masses no hepatosplenomegaly noted..  Neuro: No focal neurological deficits noted cranial nerves II through XII grossly intact. DTRs 2+ bilaterally upper and lower extremities. Strength 5 out of 5 in bilateral upper and lower extremities. Musculoskeletal: No warm swelling or erythema around joints, no spinal tenderness noted. Psychiatric: Patient alert and oriented x3, good insight and  cognition, good recent to remote recall. Lymph node survey: No cervical axillary or inguinal lymphadenopathy noted.  Lab Results:  Basic Metabolic Panel:    Component Value Date/Time   NA 140 06/29/2021 0601   NA 139 04/22/2021 1106   K 3.4 (L) 06/29/2021 0601   CL 106 06/29/2021 0601   CO2 27 06/29/2021 0601   BUN 9 06/29/2021 0601   BUN 6 04/22/2021 1106   CREATININE 0.56 (L) 06/29/2021 0601   GLUCOSE 98 06/29/2021 0601   CALCIUM 8.6 (L) 06/29/2021 0601   CBC:    Component Value Date/Time   WBC 14.1 (H) 07/01/2021 0928   HGB 6.7 (LL) 07/01/2021 0928   HGB 9.7 (L) 04/22/2021 1106   HCT 19.6 (L) 07/01/2021 0928   HCT 30.2 (L) 04/22/2021 1106   PLT 460 (H) 07/01/2021 0928   PLT 529 (H) 04/22/2021 1106   MCV 78.1 (L) 07/01/2021 0928   MCV 90 04/22/2021 1106   NEUTROABS 8.0 (H) 06/30/2021 1025   NEUTROABS 5.5 04/22/2021 1106   LYMPHSABS 2.1 06/30/2021 1025   LYMPHSABS 2.0 04/22/2021 1106   MONOABS 0.6 06/30/2021 1025   EOSABS 0.4 06/30/2021 1025   EOSABS 0.2 04/22/2021 1106   BASOSABS 0.0 06/30/2021 1025   BASOSABS 0.0 04/22/2021 1106    Recent Results (from the past 240 hour(s))  Culture, blood (routine x 2)     Status: None   Collection Time: 06/26/21  4:09 PM   Specimen: BLOOD  Result Value Ref Range Status   Specimen Description  Final    BLOOD BLOOD LEFT FOREARM Performed at [facility] [address], [place], [place] [address]    Special Requests   Final    BOTTLES DRAWN AEROBIC ONLY Blood Culture adequate volume Performed at [facility] [address], [place], [place] [address]    Culture   Final    NO GROWTH 5 DAYS Performed at [facility], [place] [address] [place], [place] [address]    Report Status 07/01/2021 FINAL  Final  Culture, blood (routine x 2)     Status: None   Collection Time: 06/26/21  4:31 PM   Specimen: BLOOD  Result Value Ref Range Status   Specimen Description   Final    BLOOD  LEFT ANTECUBITAL Performed at [facility] [address] [place], [place] [address]    Special Requests   Final    BOTTLES DRAWN AEROBIC ONLY Blood Culture adequate volume Performed at [facility] [address] [place], [place] [address]    Culture   Final    NO GROWTH 5 DAYS Performed at [facility], [place] [address] [place], [place] [address]    Report Status 07/01/2021 FINAL  Final    Studies/Results: No results found.  Medications: Scheduled Meds:  acetaminophen  650 mg Oral Once   diphenhydrAMINE  25 mg Intravenous Once   folic acid  1 mg Oral Daily   gabapentin  300 mg Oral TID   HYDROmorphone   Intravenous Q4H   hydroxyurea  500 mg Oral BID   morphine  15 mg Oral Q12H   OLANZapine  5 mg Oral QHS   rivaroxaban  20 mg Oral Q supper   senna-docusate  1 tablet Oral BID   Continuous Infusions:  sodium chloride 10 mL/hr at 07/01/21 0740   PRN Meds:.diphenhydrAMINE, mirtazapine, naloxone **AND** sodium chloride flush, ondansetron (ZOFRAN) IV, oxyCODONE-acetaminophen **AND** oxyCODONE, polyethylene glycol  Consultants: None  Procedures: None  Antibiotics: None  Assessment/Plan: Principal Problem:   Sickle cell anemia with crisis ([place]) Active Problems:   Protein-calorie malnutrition, severe (HCC)   Adjustment disorder with mixed disturbance of emotions and conduct   Chronic pain syndrome   History of DVT (deep vein thrombosis)  Sickle cell disease pain crisis:  Weaning IV dilaudid PCA. Patient has completed Toradol Continue home medications Monitor vital signs closely, reevaluate pain scale regularly, and supplemental oxygen as needed.  Ambulate in halls Discharge planned for tomorrow.  Leukocytosis: Chronic. No signs of infection or inflammation.  No clinical indication for antibiotics at this time.  Monitor closely.  Anemia of chronic disease: Hemoglobin is 6.7 g/dL.  Patient will be transfused 1 unit  PRBCs today.  We will follow labs in AM.    Chronic pain syndrome: Continue home medications  History of DVT/PE: Continue Xarelto  Adjustment disorder: Continue home medications.  Monitor closely.     Code Status: Full Code Family Communication: N/A Disposition Plan: Not yet ready for discharge    [place], MSN, FNP-C Patient [facility] [address] [place],  [phone]  If 7PM-7AM, please contact night-coverage.  07/01/2021, 11:05 AM  LOS: 5 days

## 2021-07-01 NOTE — Progress Notes (Signed)
Date and time results received: 07/01/21 0950 (use smartphrase ".now" to insert current time)  Test: Hgb  Critical Value: 6.7  Name of Provider Notified: Hulan Amato  Orders Received? Or Actions Taken?:  No new orders at this time.

## 2021-07-01 NOTE — Plan of Care (Signed)
?  Problem: Clinical Measurements: ?Goal: Will remain free from infection ?Outcome: Progressing ?  ?

## 2021-07-01 NOTE — Progress Notes (Signed)
24 hour chart audit completed 

## 2021-07-02 ENCOUNTER — Inpatient Hospital Stay (HOSPITAL_COMMUNITY): Payer: Medicare Other

## 2021-07-02 DIAGNOSIS — D5700 Hb-SS disease with crisis, unspecified: Secondary | ICD-10-CM | POA: Diagnosis not present

## 2021-07-02 DIAGNOSIS — D57219 Sickle-cell/Hb-C disease with crisis, unspecified: Secondary | ICD-10-CM

## 2021-07-02 DIAGNOSIS — M79601 Pain in right arm: Secondary | ICD-10-CM

## 2021-07-02 LAB — TYPE AND SCREEN
ABO/RH(D): A POS
Antibody Screen: NEGATIVE
Donor AG Type: NEGATIVE
Unit division: 0

## 2021-07-02 LAB — BPAM RBC
Blood Product Expiration Date: 202306092359
ISSUE DATE / TIME: 202305181441
Unit Type and Rh: 9500

## 2021-07-02 NOTE — Progress Notes (Incomplete)
RUE venous duplex has been completed.     Results can be found under chart review under CV PROC. 07/02/2021 12:17 PM Katrena Stehlin RVT, RDMS

## 2021-07-02 NOTE — Progress Notes (Signed)
Subjective: Timothy Marks is a 31 year old male with a medical history significant for sickle cell disease, chronic pain syndrome, opiate dependence and tolerance, oppositional defiance, and anemia of chronic disease was admitted for sickle cell pain crisis.   Timothy Marks is complaining of significant pain to right upper extremity, patient says that pain has worsened overnight and it is becoming increasingly difficult to move his arm. He is writhing in pain. He rates his pain as 10/10.  He denies headache, chest pain, dizziness, shortness of breath, nausea, vomiting, or diarrhea.  Objective:  Vital signs in last 24 hours:  Vitals:   07/02/21 0152 07/02/21 0434 07/02/21 0802 07/02/21 0924  BP: 122/68 130/77  (!) 84/73  Pulse: 93 97  83  Resp: '18 16 14 18  '$ Temp: 99.1 F (37.3 C) 98.7 F (37.1 C)    TempSrc: Oral Oral    SpO2: 95% 98% 100%   Weight:      Height:        Intake/Output from previous day:   Intake/Output Summary (Last 24 hours) at 07/02/2021 1009 Last data filed at 07/02/2021 0836 Gross per 24 hour  Intake 1032.5 ml  Output 2900 ml  Net -1867.5 ml    Physical Exam: General: Alert, awake, oriented x3, in no acute distress.  HEENT: /AT PEERL, EOMI Neck: Trachea midline,  no masses, no thyromegal,y no JVD, no carotid bruit OROPHARYNX:  Moist, No exudate/ erythema/lesions.  Heart: Regular rate and rhythm, without murmurs, rubs, gallops, PMI non-displaced, no heaves or thrills on palpation.  Lungs: Clear to auscultation, no wheezing or rhonchi noted. No increased vocal fremitus resonant to percussion  Abdomen: Soft, nontender, nondistended, positive bowel sounds, no masses no hepatosplenomegaly noted..  Neuro: No focal neurological deficits noted cranial nerves II through XII grossly intact. DTRs 2+ bilaterally upper and lower extremities. Strength 5 out of 5 in bilateral upper and lower extremities. Musculoskeletal: Right upper extremity has decreased ROM, tender to  touch, unable to assess strength, patient guarded.  Psychiatric: Patient alert and oriented x3, good insight and cognition, good recent to remote recall. Lymph node survey: No cervical axillary or inguinal lymphadenopathy noted.  Lab Results:  Basic Metabolic Panel:    Component Value Date/Time   NA 140 06/29/2021 0601   NA 139 04/22/2021 1106   K 3.4 (L) 06/29/2021 0601   CL 106 06/29/2021 0601   CO2 27 06/29/2021 0601   BUN 9 06/29/2021 0601   BUN 6 04/22/2021 1106   CREATININE 0.56 (L) 06/29/2021 0601   GLUCOSE 98 06/29/2021 0601   CALCIUM 8.6 (L) 06/29/2021 0601   CBC:    Component Value Date/Time   WBC 14.1 (H) 07/01/2021 0928   HGB 8.7 (L) 07/01/2021 2044   HGB 9.7 (L) 04/22/2021 1106   HCT 24.2 (L) 07/01/2021 2044   HCT 30.2 (L) 04/22/2021 1106   PLT 460 (H) 07/01/2021 0928   PLT 529 (H) 04/22/2021 1106   MCV 78.1 (L) 07/01/2021 0928   MCV 90 04/22/2021 1106   NEUTROABS 8.0 (H) 06/30/2021 1025   NEUTROABS 5.5 04/22/2021 1106   LYMPHSABS 2.1 06/30/2021 1025   LYMPHSABS 2.0 04/22/2021 1106   MONOABS 0.6 06/30/2021 1025   EOSABS 0.4 06/30/2021 1025   EOSABS 0.2 04/22/2021 1106   BASOSABS 0.0 06/30/2021 1025   BASOSABS 0.0 04/22/2021 1106    Recent Results (from the past 240 hour(s))  Culture, blood (routine x 2)     Status: None   Collection Time: 06/26/21  4:09  PM   Specimen: BLOOD  Result Value Ref Range Status   Specimen Description   Final    BLOOD BLOOD LEFT FOREARM Performed at [facility] [address] [place], [place] [address]    Special Requests   Final    BOTTLES DRAWN AEROBIC ONLY Blood Culture adequate volume Performed at [facility] [address] [place], [place] [address]    Culture   Final    NO GROWTH 5 DAYS Performed at [facility], [place] [address] [place], [place] [address]    Report Status 07/01/2021 FINAL  Final  Culture, blood (routine x 2)     Status: None   Collection Time:  06/26/21  4:31 PM   Specimen: BLOOD  Result Value Ref Range Status   Specimen Description   Final    BLOOD LEFT ANTECUBITAL Performed at [facility] [address] [place], [place] [address]    Special Requests   Final    BOTTLES DRAWN AEROBIC ONLY Blood Culture adequate volume Performed at [facility] [address] [place], [place] [address]    Culture   Final    NO GROWTH 5 DAYS Performed at [facility], [place] [address] [place], [place] [address]    Report Status 07/01/2021 FINAL  Final    Studies/Results: No results found.  Medications: Scheduled Meds:  folic acid  1 mg Oral Daily   gabapentin  300 mg Oral TID   HYDROmorphone   Intravenous Q4H   hydroxyurea  500 mg Oral BID   morphine  15 mg Oral Q12H   OLANZapine  5 mg Oral QHS   rivaroxaban  20 mg Oral Q supper   senna-docusate  1 tablet Oral BID   Continuous Infusions:  sodium chloride 10 mL/hr at 07/01/21 0740   PRN Meds:.diphenhydrAMINE, mirtazapine, naloxone **AND** sodium chloride flush, ondansetron (ZOFRAN) IV, oxyCODONE-acetaminophen **AND** oxyCODONE, polyethylene glycol  Consultants: None  Procedures: None  Antibiotics: None  Assessment/Plan: Principal Problem:   Sickle cell anemia with crisis ([facility]) Active Problems:   Protein-calorie malnutrition, severe ([facility])   Adjustment disorder with mixed disturbance of emotions and conduct   Chronic pain syndrome   History of DVT (deep vein thrombosis)  Sickle cell disease pain crisis:  Continue IV dilaudid PCA. Patient has completed Toradol Continue home medications Monitor vital signs closely, reevaluate pain scale regularly, and supplemental oxygen as needed.  Patient encouraged to ambulate in halls  Right upper extremity pain:  Patient has a new complaint of right upper extremity pain. Reviewed right arm xray, which showed no acute abnormalities. Right arm venous doppler pending, will review as  results become available.   Leukocytosis: Chronic. No signs of infection or inflammation.  No clinical indication for antibiotics at this time.  Monitor closely.  Anemia of chronic disease: Hemoglobin improved to 8.7, which is consistent with patient's baseline. He is s/p 1 unit PRBCs on 07/01/2021.  today.  We will follow labs in AM.    Chronic pain syndrome: Continue home medications  History of DVT/PE: Continue Xarelto  Adjustment disorder: Continue home medications.  Monitor closely.     Code Status: Full Code Family Communication: N/A Disposition Plan: Not yet ready for discharge    [place], MSN, FNP-C [facility] [address] [place], [place] [address] [phone]  If 7PM-7AM, please contact night-coverage.  07/02/2021, 10:09 AM  LOS: 6 days

## 2021-07-02 NOTE — Plan of Care (Signed)
  Problem: Clinical Measurements: Goal: Ability to maintain clinical measurements within normal limits will improve Outcome: Progressing Goal: Will remain free from infection Outcome: Progressing Goal: Diagnostic test results will improve Outcome: Progressing Goal: Respiratory complications will improve Outcome: Progressing Goal: Cardiovascular complication will be avoided Outcome: Progressing   Problem: Elimination: Goal: Will not experience complications related to bowel motility Outcome: Progressing Goal: Will not experience complications related to urinary retention Outcome: Progressing    Pt continues to c/o pain 9/10 and states that right arm is hurting.  Order entered for Xray of arm.  Potential d/c today per provider.

## 2021-07-03 DIAGNOSIS — D5700 Hb-SS disease with crisis, unspecified: Secondary | ICD-10-CM | POA: Diagnosis not present

## 2021-07-03 LAB — CBC
HCT: 27.2 % — ABNORMAL LOW (ref 39.0–52.0)
Hemoglobin: 9 g/dL — ABNORMAL LOW (ref 13.0–17.0)
MCH: 26.6 pg (ref 26.0–34.0)
MCHC: 33.1 g/dL (ref 30.0–36.0)
MCV: 80.5 fL (ref 80.0–100.0)
Platelets: 614 10*3/uL — ABNORMAL HIGH (ref 150–400)
RBC: 3.38 MIL/uL — ABNORMAL LOW (ref 4.22–5.81)
RDW: 22.3 % — ABNORMAL HIGH (ref 11.5–15.5)
WBC: 11.6 10*3/uL — ABNORMAL HIGH (ref 4.0–10.5)
nRBC: 1.5 % — ABNORMAL HIGH (ref 0.0–0.2)

## 2021-07-03 LAB — COMPREHENSIVE METABOLIC PANEL
ALT: 15 U/L (ref 0–44)
AST: 21 U/L (ref 15–41)
Albumin: 4.3 g/dL (ref 3.5–5.0)
Alkaline Phosphatase: 81 U/L (ref 38–126)
Anion gap: 8 (ref 5–15)
BUN: 9 mg/dL (ref 6–20)
CO2: 26 mmol/L (ref 22–32)
Calcium: 9.8 mg/dL (ref 8.9–10.3)
Chloride: 103 mmol/L (ref 98–111)
Creatinine, Ser: 0.59 mg/dL — ABNORMAL LOW (ref 0.61–1.24)
GFR, Estimated: >60 mL/min (ref >60)
Glucose, Bld: 115 mg/dL — ABNORMAL HIGH (ref 70–99)
Potassium: 4.8 mmol/L (ref 3.5–5.1)
Sodium: 137 mmol/L (ref 135–145)
Total Bilirubin: 1.8 mg/dL — ABNORMAL HIGH (ref 0.3–1.2)
Total Protein: 8.7 g/dL — ABNORMAL HIGH (ref 6.5–8.1)

## 2021-07-03 NOTE — Plan of Care (Signed)
  Problem: Clinical Measurements: Goal: Will remain free from infection Outcome: Progressing Goal: Diagnostic test results will improve Outcome: Progressing Goal: Respiratory complications will improve Outcome: Progressing Goal: Cardiovascular complication will be avoided Outcome: Progressing   Problem: Activity: Goal: Risk for activity intolerance will decrease Outcome: Progressing   Problem: Nutrition: Goal: Adequate nutrition will be maintained Outcome: Progressing   Problem: Elimination: Goal: Will not experience complications related to bowel motility Outcome: Progressing Goal: Will not experience complications related to urinary retention Outcome: Progressing   Problem: Safety: Goal: Ability to remain free from injury will improve Outcome: Progressing   Problem: Skin Integrity: Goal: Risk for impaired skin integrity will decrease Outcome: [place]

## 2021-07-03 NOTE — Progress Notes (Signed)
SICKLE CELL SERVICE PROGRESS NOTE  Timothy Marks TGG:[id number] DOB: 1990/05/08 DOA: 06/26/2021 PCP: Tresa Garter, MD  Assessment/Plan: Principal Problem:   Sickle cell anemia with crisis ([facility]) Active Problems:   Protein-calorie malnutrition, severe ([facility])   Adjustment disorder with mixed disturbance of emotions and conduct   Chronic pain syndrome   History of DVT (deep vein thrombosis)   Arm pain, diffuse, right  Sickle cell pain crisis: Patient is still complaining of 10 out of 10 pain in his left shoulder.  Also the right shoulder now is hurting him.  He is unable to move his legs apparently.  He has not been moving out of bed on has continued to complain of significant pain.  We will continue current regimen and adjust medications.  Advised on exercise of the shoulder. Chronic pain syndrome: Continue long-acting pain medications. Adjustment disorder: Continue with home regimen. History of DVT: Continue anticoagulation  Code Status: Full Family Communication: No family at bedside Disposition Plan: [place]  Pager [phone]. If 7PM-7AM, please contact night-coverage.  07/03/2021, 8:11 PM  LOS: 7 days   Brief narrative: Timothy Marks  is a 31 y.o. male with a medical history significant for sickle cell disease, chronic pain syndrome, opiate dependence and tolerance, and anemia of chronic disease who presented to the emergency room today with major complaints of pain in his lower back and extremities that has been ongoing for few days.  Patient states this is typical of her sickle cell pain crisis.  He took his home pain medications with no sustained relief.  Patient claims he was admitted at another facility recently where he said he was diagnosed with blood infection and he completed 4 weeks of IV antibiotics.  He felt better after completion of treatment but started feeling increasing pain shortly after.  He currently rates his pain at 10/10, characterized as  throbbing and achy.  No aggravating or relieving factors.  He however denies any fever, cough, chest pain, shortness of breath, nausea, vomiting or diarrhea.  No urinary symptoms.  He denies any recent travels, no sick contacts or known exposure to COVID-19.  Consultants: None  Procedures: X-ray of the shoulders  Antibiotics: None  HPI/Subjective: Patient is still complaining of 10 out of 10 pain.  Has continued to be unable to lift his right upper extremity apparently due to pain.  No fever or chills.  No other complaints.  Objective: Vitals:   07/03/21 1256 07/03/21 1605 07/03/21 1824 07/03/21 1854  BP: 115/75  107/84   Pulse: 97  97   Resp: '14 16 14   '$ Temp: 98.5 F (36.9 C)  98.6 F (37 C)   TempSrc: Oral  Oral   SpO2: 94% 90% 92% 92%  Weight:      Height:       Weight change:   Intake/Output Summary (Last 24 hours) at 07/03/2021 2011 Last data filed at 07/03/2021 1824 Gross per 24 hour  Intake 960 ml  Output 2400 ml  Net -1440 ml    General: Alert, awake, oriented x3, in no acute distress.  HEENT: [place]/AT PEERL, EOMI Neck: Trachea midline,  no masses, no thyromegal,y no JVD, no carotid bruit OROPHARYNX:  Moist, No exudate/ erythema/lesions.  Heart: Regular rate and rhythm, without murmurs, rubs, gallops, PMI non-displaced, no heaves or thrills on palpation.  Lungs: Clear to auscultation, no wheezing or rhonchi noted. No increased vocal fremitus resonant to percussion  Abdomen: Soft, nontender, nondistended, positive bowel sounds, no masses no hepatosplenomegaly  noted..  Neuro: No focal neurological deficits noted cranial nerves II through XII grossly intact. DTRs 2+ bilaterally upper and lower extremities. Strength 5 out of 5 in bilateral upper and lower extremities. Musculoskeletal: No warm swelling or erythema around joints, no spinal tenderness noted. Psychiatric: Patient alert and oriented x3, good insight and cognition, good recent to remote recall. Lymph node  survey: No cervical axillary or inguinal lymphadenopathy noted.   Data Reviewed: Basic Metabolic Panel: Recent Labs  Lab 06/29/21 0601 07/03/21 1154  NA 140 137  K 3.4* 4.8  CL 106 103  CO2 27 26  GLUCOSE 98 115*  BUN 9 9  CREATININE 0.56* 0.59*  CALCIUM 8.6* 9.8   Liver Function Tests: Recent Labs  Lab 06/29/21 0601 07/03/21 1154  AST 23 21  ALT 19 15  ALKPHOS 83 81  BILITOT 3.3* 1.8*  PROT 6.8 8.7*  ALBUMIN 3.4* 4.3   No results for input(s): LIPASE, AMYLASE in the last 168 hours. No results for input(s): AMMONIA in the last 168 hours. CBC: Recent Labs  Lab 06/28/21 0524 06/29/21 0601 06/30/21 1025 07/01/21 0928 07/01/21 2044 07/03/21 1154  WBC 11.5* 12.6* 11.2* 14.1*  --  11.6*  NEUTROABS  --   --  8.0*  --   --   --   HGB 8.6* 8.3* 7.4* 6.7* 8.7* 9.0*  HCT 25.5* 24.3* 22.4* 19.6* 24.2* 27.2*  MCV 80.7 78.1* 78.6* 78.1*  --  80.5  PLT 375 399 421* 460*  --  614*   Cardiac Enzymes: No results for input(s): CKTOTAL, CKMB, CKMBINDEX, TROPONINI in the last 168 hours. BNP (last 3 results) No results for input(s): BNP in the last 8760 hours.  ProBNP (last 3 results) No results for input(s): PROBNP in the last 8760 hours.  CBG: No results for input(s): GLUCAP in the last 168 hours.  Recent Results (from the past 240 hour(s))  Culture, blood (routine x 2)     Status: None   Collection Time: 06/26/21  4:09 PM   Specimen: BLOOD  Result Value Ref Range Status   Specimen Description   Final    BLOOD BLOOD LEFT FOREARM Performed at [facility] [address], [place], [place] [address]    Special Requests   Final    BOTTLES DRAWN AEROBIC ONLY Blood Culture adequate volume Performed at [facility] [address], [place], [place] [address]    Culture   Final    NO GROWTH 5 DAYS Performed at [facility], [place] [address], [place], [place] [address]    Report Status 07/01/2021 FINAL  Final  Culture, blood  (routine x 2)     Status: None   Collection Time: 06/26/21  4:31 PM   Specimen: BLOOD  Result Value Ref Range Status   Specimen Description   Final    BLOOD LEFT ANTECUBITAL Performed at [name] [address] [place], [place] [address]    Special Requests   Final    BOTTLES DRAWN AEROBIC ONLY Blood Culture adequate volume Performed at [facility] [address] [place], [place] [address]    Culture   Final    NO GROWTH 5 DAYS Performed at [facility], [place] [address], [place],  [address]    Report Status 07/01/2021 FINAL  Final     Studies: DG Humerus Right  Result Date: 07/02/2021 CLINICAL DATA:  Sickle cell anemia.  Pain. EXAM: RIGHT HUMERUS - 2+  VIEW COMPARISON:  None Available. FINDINGS: Mild sclerosis in the humeral head is likely due to patient's sickle cell anemia. No fractures or acute abnormalities are identified. Evaluation of the distal humerus is limited due to positioning. IMPRESSION: 1. Evaluation of the distal humerus is limited due to positioning. No acute abnormalities are identified. Electronically Signed   By: Dorise Bullion III M.D.   On: 07/02/2021 11:41   VAS [place] UPPER EXTREMITY VENOUS DUPLEX  Result Date: 07/02/2021 UPPER VENOUS STUDY  Patient Name:  RANVIR RENOVATO  Date of Exam:   07/02/2021 Medical Rec #: [id number]       Accession #:    [id number] Date of Birth: 1991-01-16       Patient Gender: M Patient Age:   18 years Exam Location[facility]  [facility] Procedure:      VAS [place] UPPER EXTREMITY VENOUS DUPLEX Referring Phys: LACHINA HOLLIS --------------------------------------------------------------------------------  Indications: Pain (sickle cell pain crisis) Other Indications: Recent PICC line to RUE. Risk Factors: Sickle cell disease HX of DVT in LE. Comparison Study: No previous exams Performing Technologist: Jody Hill RVT, RDMS  Examination Guidelines: A complete evaluation includes B-mode imaging,  spectral Doppler, color Doppler, and power Doppler as needed of all accessible portions of each vessel. Bilateral testing is considered an integral part of a complete examination. Limited examinations for reoccurring indications may be performed as noted.  Right Findings: +----------+------------+---------+-----------+----------+-------------------+ RIGHT     CompressiblePhasicitySpontaneousProperties      Summary       +----------+------------+---------+-----------+----------+-------------------+ IJV           Full       Yes       Yes                                  +----------+------------+---------+-----------+----------+-------------------+ Subclavian    Full       Yes       Yes                                  +----------+------------+---------+-----------+----------+-------------------+ Axillary      Full       Yes       Yes                                  +----------+------------+---------+-----------+----------+-------------------+ Brachial      Full       Yes       Yes                                  +----------+------------+---------+-----------+----------+-------------------+ Radial        Full                                                      +----------+------------+---------+-----------+----------+-------------------+ Ulnar         Full                                                      +----------+------------+---------+-----------+----------+-------------------+  Cephalic      Full                                                      +----------+------------+---------+-----------+----------+-------------------+ Basilic       Full       Yes       Yes              Not well visualized +----------+------------+---------+-----------+----------+-------------------+  Left Findings: +----------+------------+---------+-----------+----------+--------------------+ LEFT      CompressiblePhasicitySpontaneousProperties      Summary         +----------+------------+---------+-----------+----------+--------------------+ Subclavian               Yes       Yes                   patent by                                                              color/doppler     +----------+------------+---------+-----------+----------+--------------------+  *See table(s) above for measurements and observations.  Diagnosing physician: Jamelle Haring Electronically signed by Jamelle Haring on 07/02/2021 at 5:24:37 PM.    Final     Scheduled Meds:  folic acid  1 mg Oral Daily   gabapentin  300 mg Oral TID   HYDROmorphone   Intravenous Q4H   hydroxyurea  500 mg Oral BID   morphine  15 mg Oral Q12H   OLANZapine  5 mg Oral QHS   rivaroxaban  20 mg Oral Q supper   senna-docusate  1 tablet Oral BID   Continuous Infusions:  sodium chloride 10 mL/hr at 07/02/21 2337    Principal Problem:   Sickle cell anemia with crisis [facility]) Active Problems:   Protein-calorie malnutrition, severe (HCC)   Adjustment disorder with mixed disturbance of emotions and conduct   Chronic pain syndrome   History of DVT (deep vein thrombosis)   Arm pain, diffuse, right

## 2021-07-04 MED ORDER — HYDROMORPHONE 1 MG/ML IV SOLN
INTRAVENOUS | Status: DC
  Administered 2021-07-04: 9.5 mg via INTRAVENOUS
  Administered 2021-07-04: 30 mg via INTRAVENOUS
  Administered 2021-07-04: 7.5 mg via INTRAVENOUS
  Administered 2021-07-04: 4.5 mg via INTRAVENOUS
  Administered 2021-07-05: 7 mg via INTRAVENOUS
  Administered 2021-07-05: 4.5 mg via INTRAVENOUS
  Administered 2021-07-05: 0.5 mg via INTRAVENOUS
  Administered 2021-07-05: 8.5 mg via INTRAVENOUS
  Filled 2021-07-04: qty 30

## 2021-07-04 MED ORDER — ALPRAZOLAM 0.25 MG PO TABS
0.2500 mg | ORAL_TABLET | Freq: Once | ORAL | Status: AC
  Administered 2021-07-04: 0.25 mg via ORAL
  Filled 2021-07-04: qty 1

## 2021-07-04 NOTE — Progress Notes (Signed)
SICKLE CELL SERVICE PROGRESS NOTE  [name] QRF:[id number] DOB: 1990/12/18 DOA: 06/26/2021 PCP: Tresa Garter, MD  Assessment/Plan: Principal Problem:   Sickle cell anemia with crisis ([place]) Active Problems:   Protein-calorie malnutrition, severe ([place])   Adjustment disorder with mixed disturbance of emotions and conduct   Chronic pain syndrome   History of DVT (deep vein thrombosis)   Arm pain, diffuse, right  Sickle cell pain crisis: Patient is still complaining of 9 out of 10 pain in his left shoulder.  Also the right shoulder now is hurting him.  He is unable to move his legs apparently.  He has not been moving out of bed on has continued to complain of significant pain.  We will continue current regimen and adjust medications.  Advised on exercise of the shoulder. Chronic pain syndrome: Continue long-acting pain medications. Adjustment disorder: Continue with home regimen. History of DVT: Continue anticoagulation  Code Status: Full Family Communication: No family at bedside Disposition Plan: [place]  Pager [phone]. If 7PM-7AM, please contact night-coverage.  07/04/2021, 10:13 AM  LOS: 8 days   Brief narrative: Timothy Marks  is a 31 y.o. male with a medical history significant for sickle cell disease, chronic pain syndrome, opiate dependence and tolerance, and anemia of chronic disease who presented to the emergency room today with major complaints of pain in his lower back and extremities that has been ongoing for few days.  Patient states this is typical of her sickle cell pain crisis.  He took his home pain medications with no sustained relief.  Patient claims he was admitted at another facility recently where he said he was diagnosed with blood infection and he completed 4 weeks of IV antibiotics.  He felt better after completion of treatment but started feeling increasing pain shortly after.  He currently rates his pain at 10/10, characterized as  throbbing and achy.  No aggravating or relieving factors.  He however denies any fever, cough, chest pain, shortness of breath, nausea, vomiting or diarrhea.  No urinary symptoms.  He denies any recent travels, no sick contacts or known exposure to COVID-19.  Consultants: None  Procedures: X-ray of the shoulders  Antibiotics: None  HPI/Subjective: Patient is still complaining of 9 out of 10 pain.  Has continued to be unable to lift his right upper extremity apparently due to pain.  No fever or chills.  No other complaints.  Objective: Vitals:   07/03/21 2047 07/04/21 0029 07/04/21 0408 07/04/21 0800  BP: 115/73     Pulse: 96     Resp: '15 17 16 15  '$ Temp: 98.3 F (36.8 C)     TempSrc: Oral     SpO2: 95% 96% 95% 96%  Weight:      Height:       Weight change:   Intake/Output Summary (Last 24 hours) at 07/04/2021 1013 Last data filed at 07/03/2021 1824 Gross per 24 hour  Intake 720 ml  Output 600 ml  Net 120 ml     General: Alert, awake, oriented x3, in no acute distress.  HEENT: [place]/AT PEERL, EOMI Neck: Trachea midline,  no masses, no thyromegal,y no JVD, no carotid bruit OROPHARYNX:  Moist, No exudate/ erythema/lesions.  Heart: Regular rate and rhythm, without murmurs, rubs, gallops, PMI non-displaced, no heaves or thrills on palpation.  Lungs: Clear to auscultation, no wheezing or rhonchi noted. No increased vocal fremitus resonant to percussion  Abdomen: Soft, nontender, nondistended, positive bowel sounds, no masses no hepatosplenomegaly noted.Marland Kitchen  Neuro: No focal neurological deficits noted cranial nerves II through XII grossly intact. DTRs 2+ bilaterally upper and lower extremities. Strength 5 out of 5 in bilateral upper and lower extremities. Musculoskeletal: No warm swelling or erythema around joints, no spinal tenderness noted. Psychiatric: Patient alert and oriented x3, good insight and cognition, good recent to remote recall. Lymph node survey: No cervical axillary  or inguinal lymphadenopathy noted.   Data Reviewed: Basic Metabolic Panel: Recent Labs  Lab 06/29/21 0601 07/03/21 1154  NA 140 137  K 3.4* 4.8  CL 106 103  CO2 27 26  GLUCOSE 98 115*  BUN 9 9  CREATININE 0.56* 0.59*  CALCIUM 8.6* 9.8    Liver Function Tests: Recent Labs  Lab 06/29/21 0601 07/03/21 1154  AST 23 21  ALT 19 15  ALKPHOS 83 81  BILITOT 3.3* 1.8*  PROT 6.8 8.7*  ALBUMIN 3.4* 4.3    No results for input(s): LIPASE, AMYLASE in the last 168 hours. No results for input(s): AMMONIA in the last 168 hours. CBC: Recent Labs  Lab 06/28/21 0524 06/29/21 0601 06/30/21 1025 07/01/21 0928 07/01/21 2044 07/03/21 1154  WBC 11.5* 12.6* 11.2* 14.1*  --  11.6*  NEUTROABS  --   --  8.0*  --   --   --   HGB 8.6* 8.3* 7.4* 6.7* 8.7* 9.0*  HCT 25.5* 24.3* 22.4* 19.6* 24.2* 27.2*  MCV 80.7 78.1* 78.6* 78.1*  --  80.5  PLT 375 399 421* 460*  --  614*    Cardiac Enzymes: No results for input(s): CKTOTAL, CKMB, CKMBINDEX, TROPONINI in the last 168 hours. BNP (last 3 results) No results for input(s): BNP in the last 8760 hours.  ProBNP (last 3 results) No results for input(s): PROBNP in the last 8760 hours.  CBG: No results for input(s): GLUCAP in the last 168 hours.  Recent Results (from the past 240 hour(s))  Culture, blood (routine x 2)     Status: None   Collection Time: 06/26/21  4:09 PM   Specimen: BLOOD  Result Value Ref Range Status   Specimen Description   Final    BLOOD BLOOD LEFT FOREARM Performed at [facility] [address], [place], [place] [address]    Special Requests   Final    BOTTLES DRAWN AEROBIC ONLY Blood Culture adequate volume Performed at [facility] [address] [place], [place] [address]    Culture   Final    NO GROWTH 5 DAYS Performed at [facility], [place] [address] [place], [place] [address]    Report Status 07/01/2021 FINAL  Final  Culture, blood (routine x 2)      Status: None   Collection Time: 06/26/21  4:31 PM   Specimen: BLOOD  Result Value Ref Range Status   Specimen Description   Final    BLOOD LEFT ANTECUBITAL Performed at [facility] [address], [place], [place] [address]    Special Requests   Final    BOTTLES DRAWN AEROBIC ONLY Blood Culture adequate volume Performed at [facility] [address] [name], [place] [address]    Culture   Final    NO GROWTH 5 DAYS Performed at [facility], [place] [address] [place], [place] [address]    Report Status 07/01/2021 FINAL  Final      Studies: DG Humerus Right  Result Date: 07/02/2021 CLINICAL DATA:  Sickle cell anemia.  Pain. EXAM: RIGHT HUMERUS -  2+ VIEW COMPARISON:  None Available. FINDINGS: Mild sclerosis in the humeral head is likely due to patient's sickle cell anemia. No fractures or acute abnormalities are identified. Evaluation of the distal humerus is limited due to positioning. IMPRESSION: 1. Evaluation of the distal humerus is limited due to positioning. No acute abnormalities are identified. Electronically Signed   By: Dorise Bullion III M.D.   On: 07/02/2021 11:41   VAS [place] UPPER EXTREMITY VENOUS DUPLEX  Result Date: 07/02/2021 UPPER VENOUS STUDY  Patient Name:  ALIOUNE HODGKIN  Date of Exam:   07/02/2021 Medical Rec #: [id number]       Accession #:    [id number] Date of Birth: [birth year]       Patient Gender: M Patient Age:   25 years Exam Location:  [facility] Procedure:      VAS [place] UPPER EXTREMITY VENOUS DUPLEX Referring Phys: LACHINA HOLLIS --------------------------------------------------------------------------------  Indications: Pain (sickle cell pain crisis) Other Indications: Recent PICC line to RUE. Risk Factors: Sickle cell disease HX of DVT in LE. Comparison Study: No previous exams Performing Technologist: Jody Hill RVT, RDMS  Examination Guidelines: A complete evaluation includes B-mode imaging, spectral Doppler,  color Doppler, and power Doppler as needed of all accessible portions of each vessel. Bilateral testing is considered an integral part of a complete examination. Limited examinations for reoccurring indications may be performed as noted.  Right Findings: +----------+------------+---------+-----------+----------+-------------------+ RIGHT     CompressiblePhasicitySpontaneousProperties      Summary       +----------+------------+---------+-----------+----------+-------------------+ IJV           Full       Yes       Yes                                  +----------+------------+---------+-----------+----------+-------------------+ Subclavian    Full       Yes       Yes                                  +----------+------------+---------+-----------+----------+-------------------+ Axillary      Full       Yes       Yes                                  +----------+------------+---------+-----------+----------+-------------------+ Brachial      Full       Yes       Yes                                  +----------+------------+---------+-----------+----------+-------------------+ Radial        Full                                                      +----------+------------+---------+-----------+----------+-------------------+ Ulnar         Full                                                      +----------+------------+---------+-----------+----------+-------------------+  Cephalic      Full                                                      +----------+------------+---------+-----------+----------+-------------------+ Basilic       Full       Yes       Yes              Not well visualized +----------+------------+---------+-----------+----------+-------------------+  Left Findings: +----------+------------+---------+-----------+----------+--------------------+ LEFT      CompressiblePhasicitySpontaneousProperties      Summary         +----------+------------+---------+-----------+----------+--------------------+ Subclavian               Yes       Yes                   patent by                                                              color/doppler     +----------+------------+---------+-----------+----------+--------------------+  *See table(s) above for measurements and observations.  Diagnosing physician: Jamelle Haring Electronically signed by Jamelle Haring on 07/02/2021 at 5:24:37 PM.    Final     Scheduled Meds:  folic acid  1 mg Oral Daily   gabapentin  300 mg Oral TID   HYDROmorphone   Intravenous Q4H   hydroxyurea  500 mg Oral BID   morphine  15 mg Oral Q12H   OLANZapine  5 mg Oral QHS   rivaroxaban  20 mg Oral Q supper   senna-docusate  1 tablet Oral BID   Continuous Infusions:  sodium chloride 10 mL/hr at 07/02/21 2337    Principal Problem:   Sickle cell anemia with crisis [facility]) Active Problems:   Protein-calorie malnutrition, severe ([facility])   Adjustment disorder with mixed disturbance of emotions and conduct   Chronic pain syndrome   History of DVT (deep vein thrombosis)   Arm pain, diffuse, right

## 2021-07-05 DIAGNOSIS — D5700 Hb-SS disease with crisis, unspecified: Secondary | ICD-10-CM | POA: Diagnosis not present

## 2021-07-05 LAB — CBC
HCT: 24.7 % — ABNORMAL LOW (ref 39.0–52.0)
Hemoglobin: 8.4 g/dL — ABNORMAL LOW (ref 13.0–17.0)
MCH: 28.1 pg (ref 26.0–34.0)
MCHC: 34 g/dL (ref 30.0–36.0)
MCV: 82.6 fL (ref 80.0–100.0)
Platelets: 602 10*3/uL — ABNORMAL HIGH (ref 150–400)
RBC: 2.99 MIL/uL — ABNORMAL LOW (ref 4.22–5.81)
RDW: 23.4 % — ABNORMAL HIGH (ref 11.5–15.5)
WBC: 14.3 10*3/uL — ABNORMAL HIGH (ref 4.0–10.5)
nRBC: 4.9 % — ABNORMAL HIGH (ref 0.0–0.2)

## 2021-07-05 MED ORDER — RIVAROXABAN 20 MG PO TABS: 20.0000 mg | ORAL_TABLET | Freq: Every day | ORAL | 3 refills | Status: DC

## 2021-07-05 MED ORDER — OXYCODONE-ACETAMINOPHEN 10-325 MG PO TABS: 1.0000 | ORAL_TABLET | Freq: Four times a day (QID) | ORAL | 0 refills | Status: DC | PRN

## 2021-07-05 MED ORDER — FOLIC ACID 1 MG PO TABS: 1.0000 mg | ORAL_TABLET | Freq: Every day | ORAL | 2 refills | Status: DC

## 2021-07-05 NOTE — Progress Notes (Signed)
Discharge instructions gone over with patient. Prescriptions need to be picked up from pharmacy. Girlfriend here to take patient home.Timothy Marks refused to use a wheelchair for discharge. He ambulated with his girlfriend to the front door carrying his belongings.

## 2021-07-05 NOTE — Care Management Important Message (Signed)
Important Message  Patient Details IM Letter given to the Patient. Name: Timothy Marks MRN: [id number] Date of Birth: April 02, 1990   Medicare Important Message Given:  Yes     Kerin Salen 07/05/2021, 11:53 AM

## 2021-07-05 NOTE — Discharge Summary (Signed)
Physician Discharge Summary  Timothy Marks GGY:[id number] DOB: [birth year] DOA: 06/26/2021  PCP: Tresa Garter, MD  Admit date: 06/26/2021  Discharge date: 07/05/2021  Discharge Diagnoses:  Principal Problem:   Sickle cell anemia with crisis [facility]) Active Problems:   Protein-calorie malnutrition, severe (HCC)   Adjustment disorder with mixed disturbance of emotions and conduct   Chronic pain syndrome   History of DVT (deep vein thrombosis)   Arm pain, diffuse, right   Discharge Condition: Stable  Disposition:   Follow-up Information     Tresa Garter, MD Follow up.   Specialty: Internal Medicine Contact information: [address] [place] [address] [phone]         Dorena Dew, FNP Follow up on 07/13/2021.   Specialty: Family Medicine Why: Labs in the day hospital cbcd/cmp Contact information: [address] [address] [phone]                Pt is discharged home in good condition and is to follow up with Tresa Garter, MD this week to have labs evaluated. Timothy Marks is instructed to increase activity slowly and balance with rest for the next few days, and use prescribed medication to complete treatment of pain  Diet: Regular Wt Readings from Last 3 Encounters:  06/26/21 59 kg  05/03/21 61.2 kg  04/22/21 54.4 kg    History of present illness:  Timothy Marks is a 31 year old male with a medical history significant for sickle cell disease, chronic pain syndrome, opiate dependence and tolerance, and anemia of chronic disease who presented to the emergency room today with major complaints of pain in his lower back and extremities that has been ongoing for few days.  Patient states that this is typical of his sickle cell pain crisis.  He took his home medications with no sustained relief.  Patient claims that he was admitted at another facility recently where he was diagnosed with a blood  infection.  He completed 4 weeks of IV antibiotics.  He felt better after completion of treatment, but started to feel increasing pain shortly after.  He currently rates his pain at 10/10 characterized as throbbing and achy.  No aggravating or relieving factors.  He however denied any fever, cough, chest pain, shortness of breath, nausea, vomiting, or diarrhea.  No urinary symptoms.  He denies any recent travels no sick contacts or known exposure to COVID-19.  ED course: Vital signs show BP 116/77, pulse 75, temperature 98.3 F, respirations 20, and oxygen saturation 100%.  Patient was found to be in acute pain, distress, comprehensive metabolic panel showed potassium of 3.3, otherwise normal.  Hemoglobin was 12.0, WBC count of 12.5, and platelet count 427,000.  Patient received multiple doses of IV Dilaudid in the emergency room with no significant relief of his pain.  Patient will be admitted for further evaluation and management of his sickle cell pain crisis.  Hospital Course:  Sickle cell disease with pain crisis: Patient was admitted for sickle cell pain crisis and managed appropriately with IVF, IV Dilaudid via PCA and IV Toradol, as well as other adjunct therapies per sickle cell pain management protocols.  Patient was slow to respond to prescribed treatment modalities.  Patient underwent several x-rays that showed no acute abnormalities.  He has a history of chronic pain as well, pain is typically in his low back.  He is reporting pain today at 6/10.  However, patient is requesting discharge home.  He says that he can manage at home on current medication regimen.  He says that he is out of his home opiates.  Percocet 10-325 mg every 6 hours as needed #60 was sent to patient's pharmacy.  PDMP was reviewed prior to prescribing opiate medications, no inconsistencies noted.  Anemia of chronic disease: Patient's hemoglobin decreased to 6.7 g/dL.  He was transfused 1 unit PRBCs.  Prior to discharge  hemoglobin returned to baseline.  Prior to discharge, hemoglobin is 8.4 g/dL.  Patient [facility] 1 week to repeat CBC with differential and CMP.  Also, patient will follow-up with his PCP for medication management.  Patient will resume all home medications.  Patient is alert, oriented, and ambulating without assistance.  He is afebrile and oxygen saturation is 98% on room air.  Patient was therefore discharged home today in a hemodynamically stable condition.   Timothy Marks will follow-up with PCP within 1 week of this discharge. Timothy Marks was counseled extensively about nonpharmacologic means of pain management, patient verbalized understanding and was appreciative of  the care received during this admission.   We discussed the need for good hydration, monitoring of hydration status, avoidance of heat, cold, stress, and infection triggers. We discussed the need to be adherent with taking Hydrea and other home medications. Patient was reminded of the need to seek medical attention immediately if any symptom of bleeding, anemia, or infection occurs.  Discharge Exam: Vitals:   07/05/21 1014 07/05/21 1153  BP: 116/78   Pulse: (!) 110   Resp: 16 17  Temp: 98.7 F (37.1 C)   SpO2: 98% 99%   Vitals:   07/05/21 0518 07/05/21 0806 07/05/21 1014 07/05/21 1153  BP: 129/73  116/78   Pulse: 99  (!) 110   Resp: '14 13 16 17  '$ Temp: 98.1 F (36.7 C)  98.7 F (37.1 C)   TempSrc: Oral  Oral   SpO2: 99% 98% 98% 99%  Weight:      Height:        General appearance : Awake, alert, not in any distress. Speech Clear. Not toxic looking HEENT: Atraumatic and Normocephalic, pupils equally reactive to light and accomodation Neck: Supple, no JVD. No cervical lymphadenopathy.  Chest: Good air entry bilaterally, no added sounds  CVS: S1 S2 regular, no murmurs.  Abdomen: Bowel sounds present, Non tender and not distended with no gaurding, rigidity or rebound. Extremities: B/L Lower Ext shows no  edema, both legs are warm to touch Neurology: Awake alert, and oriented X 3, CN II-XII intact, Non focal Skin: No Rash  Discharge Instructions  Discharge Instructions     Discharge patient   Complete by: As directed    Discharge disposition: 01-Home or Self Care   Discharge patient date: 07/05/2021      Allergies as of 07/05/2021       Reactions   Buprenorphine Hcl Hives   Levaquin [levofloxacin] Itching   Meperidine Rash   Morphine Hives   Morphine And Related Hives   Tramadol Hives   Vancomycin Itching   Zosyn [piperacillin Sod-tazobactam So] Itching, Rash, Other (See Comments)   Has taken rocephin in the past        Medication List     TAKE these medications    cholecalciferol 25 MCG (1000 UNIT) tablet Commonly known as: VITAMIN D Take 1,000 Units by mouth daily.   folic acid 1 MG tablet Commonly known as: FOLVITE Take 1 tablet (1 mg total) by mouth daily.  gabapentin 300 MG capsule Commonly known as: NEURONTIN Take 1 capsule (300 mg total) by mouth 3 (three) times daily.   hydroxyurea 500 MG capsule Commonly known as: HYDREA Take 1 capsule (500 mg total) by mouth 2 (two) times daily. May take with food to minimize GI side effects.   hydrOXYzine 25 MG tablet Commonly known as: ATARAX Take 25 mg by mouth daily as needed for anxiety or vomiting.   mirtazapine 7.5 MG tablet Commonly known as: REMERON Take 7.5 mg by mouth at bedtime as needed (for sleep).   morphine 15 MG 12 hr tablet Commonly known as: MS CONTIN Take 1 tablet (15 mg total) by mouth every 12 (twelve) hours.   OLANZapine 5 MG tablet Commonly known as: ZYPREXA Take 1 tablet (5 mg total) by mouth at bedtime.   oxyCODONE-acetaminophen 10-325 MG tablet Commonly known as: PERCOCET Take 1 tablet by mouth every 6 (six) hours as needed for up to 15 days for pain.   rivaroxaban 20 MG Tabs tablet Commonly known as: Xarelto Take 1 tablet (20 mg total) by mouth daily with supper.         The results of significant diagnostics from this hospitalization (including imaging, microbiology, ancillary and laboratory) are listed below for reference.    Significant Diagnostic Studies: DG Humerus Right  Result Date: 07/02/2021 CLINICAL DATA:  Sickle cell anemia.  Pain. EXAM: RIGHT HUMERUS - 2+ VIEW COMPARISON:  None Available. FINDINGS: Mild sclerosis in the humeral head is likely due to patient's sickle cell anemia. No fractures or acute abnormalities are identified. Evaluation of the distal humerus is limited due to positioning. IMPRESSION: 1. Evaluation of the distal humerus is limited due to positioning. No acute abnormalities are identified. Electronically Signed   By: Dorise Bullion III M.D.   On: 07/02/2021 11:41   VAS [place] UPPER EXTREMITY VENOUS DUPLEX  Result Date: 07/02/2021 UPPER VENOUS STUDY  Patient Name:  Timothy Marks  Date of Exam:   07/02/2021 Medical Rec #: [id number]       Accession #:    [id number] Date of Birth: [birth year]       Patient Gender: M Patient Age:   21 years Exam Location:  [facility] Procedure:      VAS [place] UPPER EXTREMITY VENOUS DUPLEX Referring Phys: Bryer Cozzolino --------------------------------------------------------------------------------  Indications: Pain (sickle cell pain crisis) Other Indications: Recent PICC line to RUE. Risk Factors: Sickle cell disease HX of DVT in LE. Comparison Study: No previous exams Performing Technologist: Jody Hill RVT, RDMS  Examination Guidelines: A complete evaluation includes B-mode imaging, spectral Doppler, color Doppler, and power Doppler as needed of all accessible portions of each vessel. Bilateral testing is considered an integral part of a complete examination. Limited examinations for reoccurring indications may be performed as noted.  Right Findings: +----------+------------+---------+-----------+----------+-------------------+ RIGHT     CompressiblePhasicitySpontaneousProperties      Summary        +----------+------------+---------+-----------+----------+-------------------+ IJV           Full       Yes       Yes                                  +----------+------------+---------+-----------+----------+-------------------+ Subclavian    Full       Yes       Yes                                  +----------+------------+---------+-----------+----------+-------------------+  Axillary      Full       Yes       Yes                                  +----------+------------+---------+-----------+----------+-------------------+ Brachial      Full       Yes       Yes                                  +----------+------------+---------+-----------+----------+-------------------+ Radial        Full                                                      +----------+------------+---------+-----------+----------+-------------------+ Ulnar         Full                                                      +----------+------------+---------+-----------+----------+-------------------+ Cephalic      Full                                                      +----------+------------+---------+-----------+----------+-------------------+ Basilic       Full       Yes       Yes              Not well visualized +----------+------------+---------+-----------+----------+-------------------+  Left Findings: +----------+------------+---------+-----------+----------+--------------------+ LEFT      CompressiblePhasicitySpontaneousProperties      Summary        +----------+------------+---------+-----------+----------+--------------------+ Subclavian               Yes       Yes                   patent by                                                              color/doppler     +----------+------------+---------+-----------+----------+--------------------+  *See table(s) above for measurements and observations.  Diagnosing physician: Jamelle Haring Electronically  signed by Jamelle Haring on 07/02/2021 at 5:24:37 PM.    Final     Microbiology: Recent Results (from the past 240 hour(s))  Culture, blood (routine x 2)     Status: None   Collection Time: 06/26/21  4:09 PM   Specimen: BLOOD  Result Value Ref Range Status   Specimen Description   Final    BLOOD BLOOD LEFT FOREARM Performed at [facility], [place] [address], [place], [place] [address]    Special Requests   Final    BOTTLES DRAWN AEROBIC ONLY Blood Culture adequate volume Performed at [facility] [address] [place], [place]  [address]    Culture   Final    NO GROWTH 5 DAYS Performed at [facility], [place] [address], [place], [place] [address]    Report Status 07/01/2021 FINAL  Final  Culture, blood (routine x 2)     Status: None   Collection Time: 06/26/21  4:31 PM   Specimen: BLOOD  Result Value Ref Range Status   Specimen Description   Final    BLOOD LEFT ANTECUBITAL Performed at [facility] [address], [place], [place] [address]    Special Requests   Final    BOTTLES DRAWN AEROBIC ONLY Blood Culture adequate volume Performed at [facility] [address], [place], [place] [address]    Culture   Final    NO GROWTH 5 DAYS Performed at [facility], [place] [address] [place], [place] [address]    Report Status 07/01/2021 FINAL  Final     Labs: Basic Metabolic Panel: Recent Labs  Lab 06/29/21 0601 07/03/21 1154  NA 140 137  K 3.4* 4.8  CL 106 103  CO2 27 26  GLUCOSE 98 115*  BUN 9 9  CREATININE 0.56* 0.59*  CALCIUM 8.6* 9.8   Liver Function Tests: Recent Labs  Lab 06/29/21 0601 07/03/21 1154  AST 23 21  ALT 19 15  ALKPHOS 83 81  BILITOT 3.3* 1.8*  PROT 6.8 8.7*  ALBUMIN 3.4* 4.3   No results for input(s): LIPASE, AMYLASE in the last 168 hours. No results for input(s): AMMONIA in the last 168 hours. CBC: Recent Labs  Lab 06/29/21 0601 06/30/21 1025  07/01/21 0928 07/01/21 2044 07/03/21 1154 07/05/21 0959  WBC 12.6* 11.2* 14.1*  --  11.6* 14.3*  NEUTROABS  --  8.0*  --   --   --   --   HGB 8.3* 7.4* 6.7* 8.7* 9.0* 8.4*  HCT 24.3* 22.4* 19.6* 24.2* 27.2* 24.7*  MCV 78.1* 78.6* 78.1*  --  80.5 82.6  PLT 399 421* 460*  --  614* 602*   Cardiac Enzymes: No results for input(s): CKTOTAL, CKMB, CKMBINDEX, TROPONINI in the last 168 hours. BNP: Invalid input(s): POCBNP CBG: No results for input(s): GLUCAP in the last 168 hours.  Time coordinating discharge: 30 minutes  Signed:  [name]  APRN, MSN, FNP-C Patient [facility] [place], [place] [address] [phone]  [facility] 07/05/2021, 1:58 PM

## 2021-07-07 IMAGING — CR DG CHEST 2V
2 series · 2 of 2 positions shown · non-contrast
Comparison: PA and lateral chest 05/30/2019.

CLINICAL DATA: Chest pain in a patient with a history of sickle
cell disease.

EXAM:
CHEST - 2 VIEW

[w chest pa]
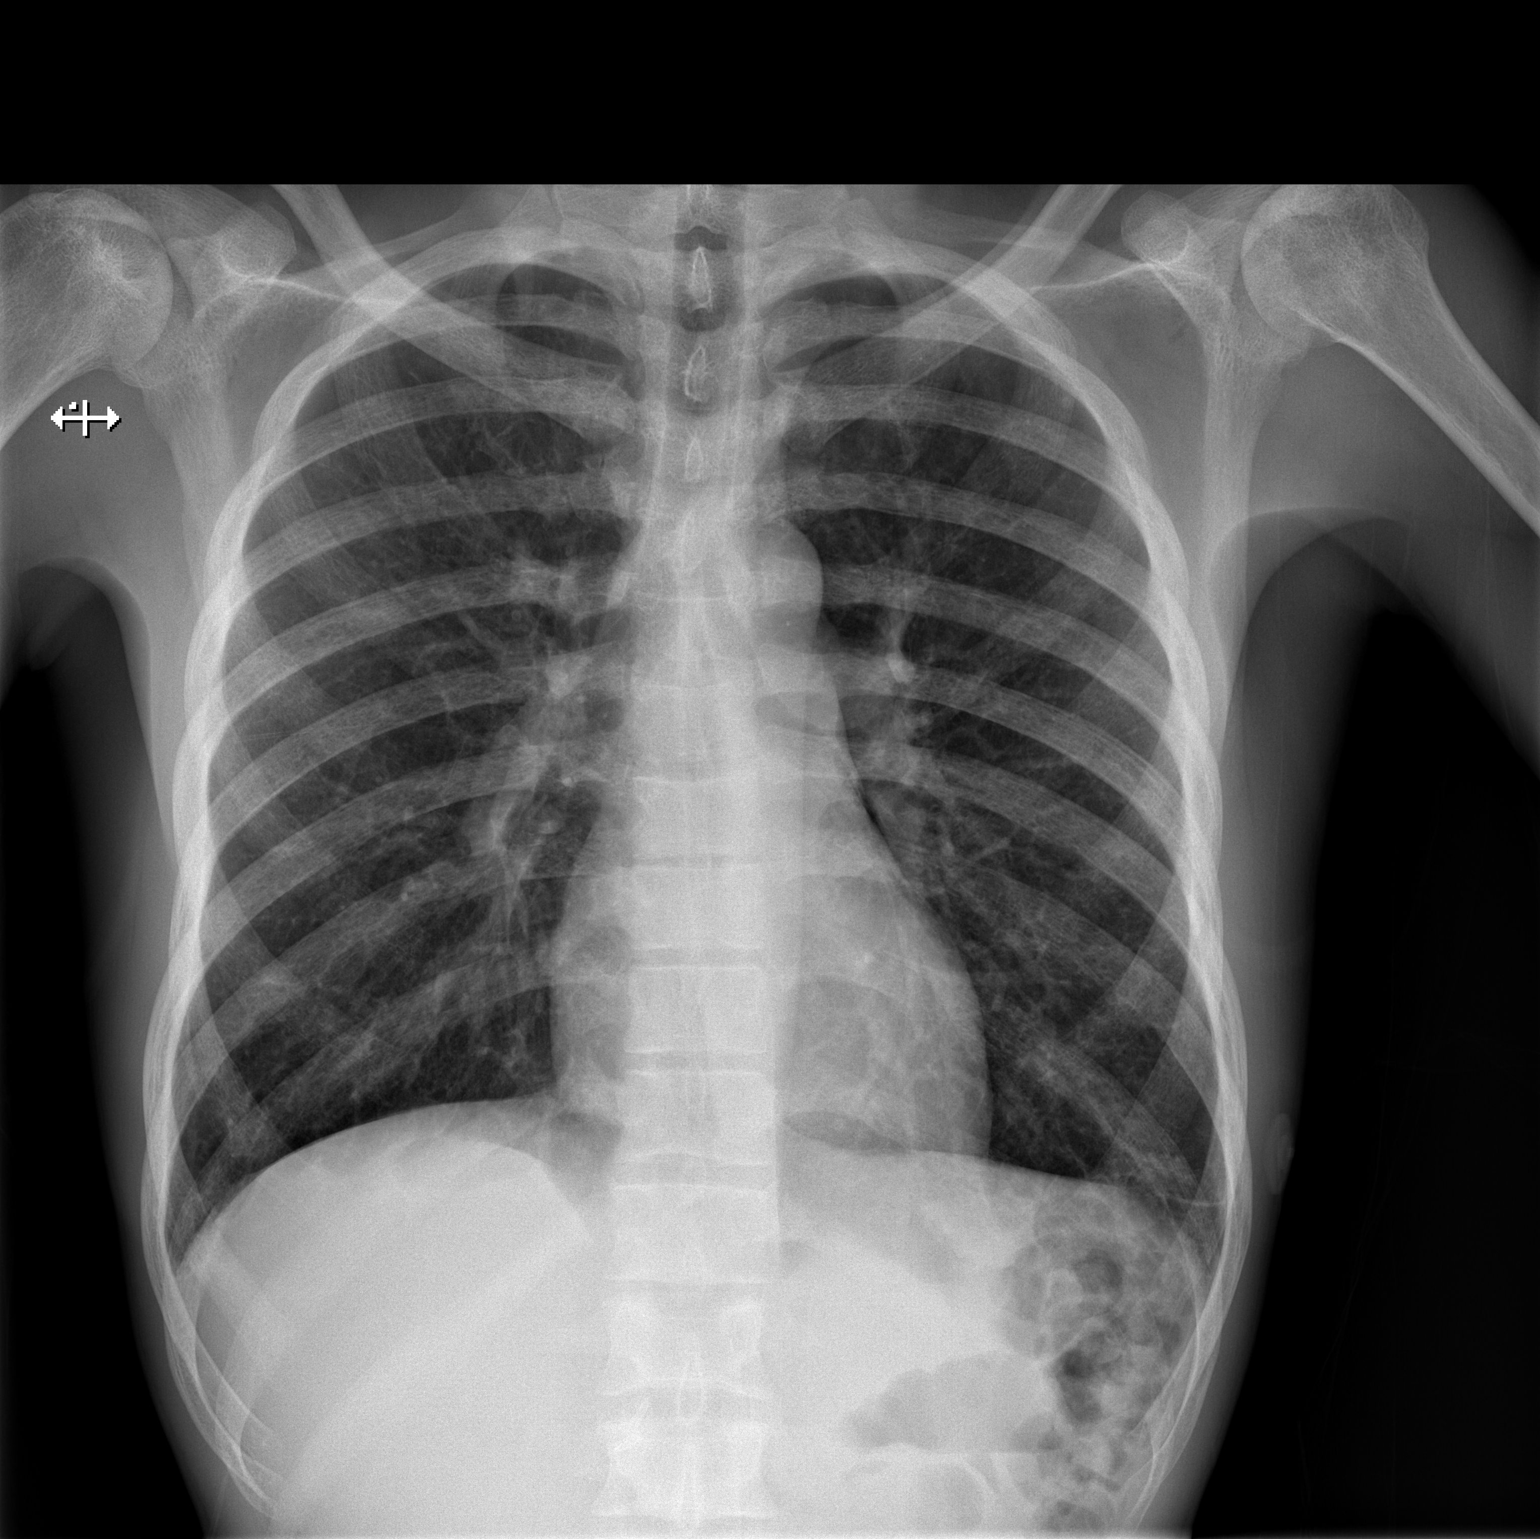

[w chest lat]
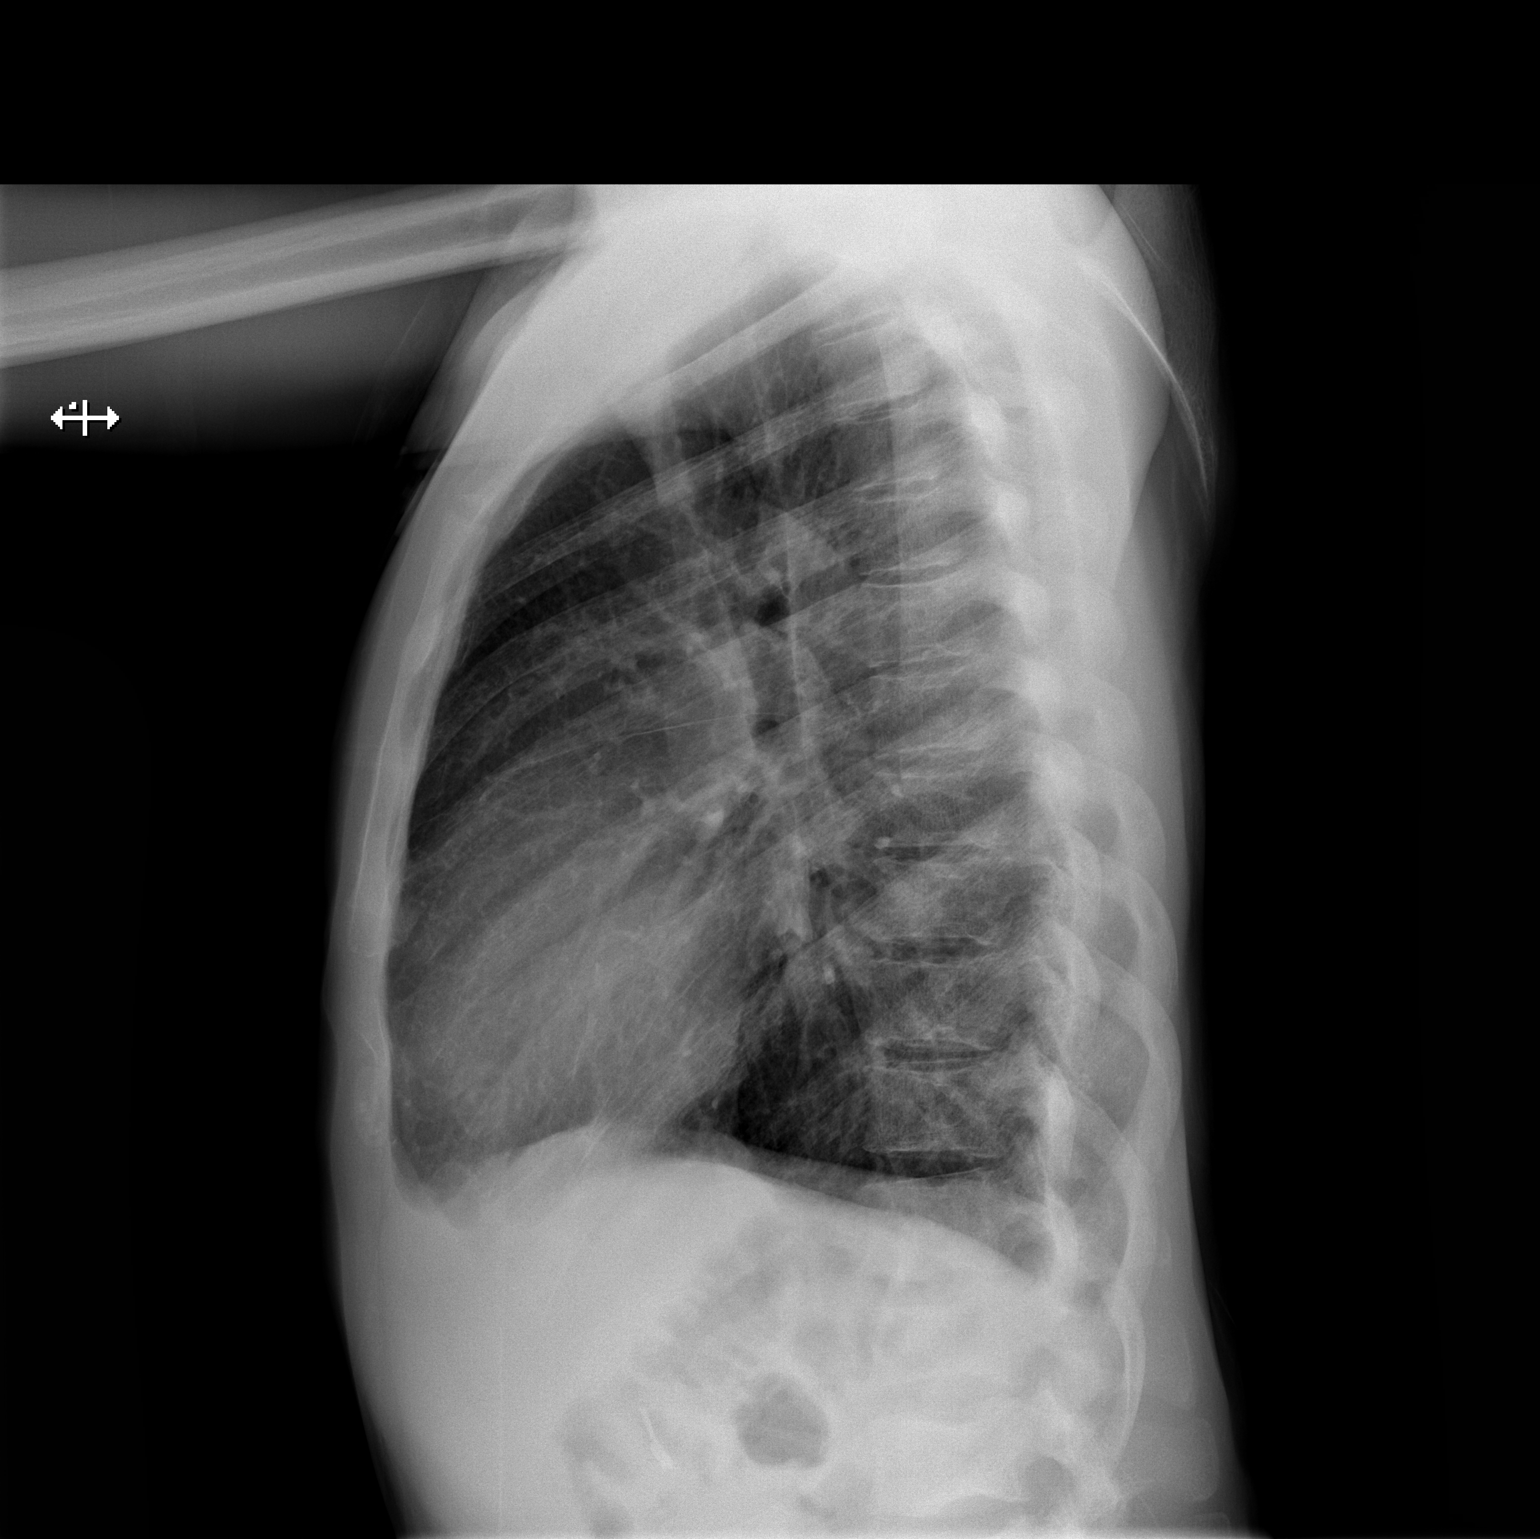

[2 of 2 positions shown; findings below may reference images not displayed]

FINDINGS: Lungs clear. Heart size normal. No pneumothorax or pleural effusion.
No acute bony abnormality. Bony changes of sickle cell disease
noted.
IMPRESSION: No acute disease.

## 2021-07-10 DIAGNOSIS — Z86718 Personal history of other venous thrombosis and embolism: Secondary | ICD-10-CM | POA: Diagnosis not present

## 2021-07-10 DIAGNOSIS — Z881 Allergy status to other antibiotic agents status: Secondary | ICD-10-CM | POA: Diagnosis not present

## 2021-07-10 DIAGNOSIS — Z888 Allergy status to other drugs, medicaments and biological substances status: Secondary | ICD-10-CM | POA: Diagnosis not present

## 2021-07-10 DIAGNOSIS — Z885 Allergy status to narcotic agent status: Secondary | ICD-10-CM | POA: Diagnosis not present

## 2021-07-10 DIAGNOSIS — D571 Sickle-cell disease without crisis: Secondary | ICD-10-CM | POA: Diagnosis not present

## 2021-07-10 DIAGNOSIS — E876 Hypokalemia: Secondary | ICD-10-CM | POA: Diagnosis not present

## 2021-07-10 DIAGNOSIS — Z79899 Other long term (current) drug therapy: Secondary | ICD-10-CM | POA: Diagnosis not present

## 2021-07-10 DIAGNOSIS — D5700 Hb-SS disease with crisis, unspecified: Secondary | ICD-10-CM | POA: Diagnosis not present

## 2021-07-10 DIAGNOSIS — Z681 Body mass index (BMI) 19 or less, adult: Secondary | ICD-10-CM | POA: Diagnosis not present

## 2021-07-10 DIAGNOSIS — R636 Underweight: Secondary | ICD-10-CM | POA: Diagnosis not present

## 2021-07-10 DIAGNOSIS — D75839 Thrombocytosis, unspecified: Secondary | ICD-10-CM | POA: Diagnosis not present

## 2021-07-10 DIAGNOSIS — Z7901 Long term (current) use of anticoagulants: Secondary | ICD-10-CM | POA: Diagnosis not present

## 2021-07-11 DIAGNOSIS — Z86718 Personal history of other venous thrombosis and embolism: Secondary | ICD-10-CM | POA: Diagnosis not present

## 2021-07-11 DIAGNOSIS — D75839 Thrombocytosis, unspecified: Secondary | ICD-10-CM | POA: Diagnosis not present

## 2021-07-11 DIAGNOSIS — R636 Underweight: Secondary | ICD-10-CM | POA: Diagnosis not present

## 2021-07-11 DIAGNOSIS — D5700 Hb-SS disease with crisis, unspecified: Secondary | ICD-10-CM | POA: Diagnosis not present

## 2021-07-11 DIAGNOSIS — E876 Hypokalemia: Secondary | ICD-10-CM | POA: Diagnosis not present

## 2021-07-14 DIAGNOSIS — Z832 Family history of diseases of the blood and blood-forming organs and certain disorders involving the immune mechanism: Secondary | ICD-10-CM | POA: Diagnosis not present

## 2021-07-14 DIAGNOSIS — Z7901 Long term (current) use of anticoagulants: Secondary | ICD-10-CM | POA: Diagnosis not present

## 2021-07-14 DIAGNOSIS — Z79891 Long term (current) use of opiate analgesic: Secondary | ICD-10-CM | POA: Diagnosis not present

## 2021-07-14 DIAGNOSIS — D57219 Sickle-cell/Hb-C disease with crisis, unspecified: Secondary | ICD-10-CM | POA: Diagnosis not present

## 2021-07-14 DIAGNOSIS — E538 Deficiency of other specified B group vitamins: Secondary | ICD-10-CM | POA: Diagnosis not present

## 2021-07-14 DIAGNOSIS — Z86718 Personal history of other venous thrombosis and embolism: Secondary | ICD-10-CM | POA: Diagnosis not present

## 2021-07-14 DIAGNOSIS — D5700 Hb-SS disease with crisis, unspecified: Secondary | ICD-10-CM | POA: Diagnosis not present

## 2021-07-14 DIAGNOSIS — E559 Vitamin D deficiency, unspecified: Secondary | ICD-10-CM | POA: Diagnosis not present

## 2021-07-14 DIAGNOSIS — Z87891 Personal history of nicotine dependence: Secondary | ICD-10-CM | POA: Diagnosis not present

## 2021-07-14 DIAGNOSIS — F329 Major depressive disorder, single episode, unspecified: Secondary | ICD-10-CM | POA: Diagnosis not present

## 2021-07-14 DIAGNOSIS — Q8901 Asplenia (congenital): Secondary | ICD-10-CM | POA: Diagnosis not present

## 2021-07-14 DIAGNOSIS — M545 Low back pain, unspecified: Secondary | ICD-10-CM | POA: Diagnosis present

## 2021-07-15 ENCOUNTER — Telehealth: Payer: Self-pay

## 2021-07-15 NOTE — Telephone Encounter (Signed)
Patient called requesting refill, patient aware meds not due until next week. Stated he called earlier because he is in pain and wants to be sure they are filled on time. Thanks.

## 2021-07-16 NOTE — Telephone Encounter (Signed)
Percocet refill due on Monday

## 2021-07-19 ENCOUNTER — Other Ambulatory Visit: Payer: Self-pay | Admitting: Internal Medicine

## 2021-07-19 MED ORDER — OXYCODONE-ACETAMINOPHEN 10-325 MG PO TABS
1.0000 | ORAL_TABLET | Freq: Four times a day (QID) | ORAL | 0 refills | Status: DC | PRN
Start: 2021-07-20 — End: 2021-08-02

## 2021-07-21 ENCOUNTER — Telehealth: Payer: Self-pay

## 2021-07-21 NOTE — Telephone Encounter (Signed)
oxyCODONE-acetaminophen (PERCOCET) 10-325 MG tablet   Prior authorization started on 07/21/21.

## 2021-07-23 ENCOUNTER — Telehealth: Payer: Self-pay

## 2021-07-23 NOTE — Telephone Encounter (Signed)
Patient requesting refill on Morphine.

## 2021-07-26 ENCOUNTER — Telehealth: Payer: Self-pay | Admitting: Internal Medicine

## 2021-07-26 NOTE — Telephone Encounter (Signed)
Morphine refill request.

## 2021-07-27 ENCOUNTER — Other Ambulatory Visit: Payer: Self-pay | Admitting: Internal Medicine

## 2021-07-27 MED ORDER — MORPHINE SULFATE ER 15 MG PO TBCR: 15.0000 mg | EXTENDED_RELEASE_TABLET | Freq: Two times a day (BID) | ORAL | 0 refills | Status: DC

## 2021-08-02 ENCOUNTER — Other Ambulatory Visit (HOSPITAL_COMMUNITY): Payer: Self-pay

## 2021-08-02 ENCOUNTER — Other Ambulatory Visit: Payer: Self-pay | Admitting: Family Medicine

## 2021-08-02 ENCOUNTER — Telehealth: Payer: Self-pay | Admitting: Internal Medicine

## 2021-08-02 DIAGNOSIS — D571 Sickle-cell disease without crisis: Secondary | ICD-10-CM

## 2021-08-02 DIAGNOSIS — G894 Chronic pain syndrome: Secondary | ICD-10-CM

## 2021-08-02 MED ORDER — OXYCODONE-ACETAMINOPHEN 10-325 MG PO TABS: 1.0000 | ORAL_TABLET | Freq: Four times a day (QID) | ORAL | 0 refills | Status: DC | PRN

## 2021-08-02 NOTE — Telephone Encounter (Signed)
Refill request for Percocet.

## 2021-08-02 NOTE — Progress Notes (Signed)
Reviewed PDMP substance reporting system prior to prescribing opiate medications. No inconsistencies noted.  Meds ordered this encounter  Medications   oxyCODONE-acetaminophen (PERCOCET) 10-325 MG tablet    Sig: Take 1 tablet by mouth every 6 (six) hours as needed for up to 15 days for pain.    Dispense:  60 tablet    Refill:  0    Order Specific Question:   Supervising Provider    Answer:   Tresa Garter [id number]   [name]  APRN, MSN, FNP-C Patient [facility] [address] [place], [place] [address] [phone]

## 2021-08-03 ENCOUNTER — Other Ambulatory Visit: Payer: Self-pay | Admitting: Internal Medicine

## 2021-08-03 ENCOUNTER — Other Ambulatory Visit (HOSPITAL_COMMUNITY): Payer: Self-pay

## 2021-08-03 DIAGNOSIS — D571 Sickle-cell disease without crisis: Secondary | ICD-10-CM

## 2021-08-03 DIAGNOSIS — G894 Chronic pain syndrome: Secondary | ICD-10-CM

## 2021-08-03 MED ORDER — OXYCODONE-ACETAMINOPHEN 10-325 MG PO TABS
1.0000 | ORAL_TABLET | Freq: Four times a day (QID) | ORAL | 0 refills | Status: DC | PRN
  Filled 2021-08-03: qty 60, 15d supply, fill #0

## 2021-08-05 ENCOUNTER — Other Ambulatory Visit (HOSPITAL_COMMUNITY): Payer: Self-pay

## 2021-08-16 ENCOUNTER — Other Ambulatory Visit: Payer: Self-pay | Admitting: Family Medicine

## 2021-08-16 ENCOUNTER — Telehealth: Payer: Self-pay | Admitting: Internal Medicine

## 2021-08-16 ENCOUNTER — Other Ambulatory Visit (HOSPITAL_COMMUNITY): Payer: Self-pay

## 2021-08-16 DIAGNOSIS — D571 Sickle-cell disease without crisis: Secondary | ICD-10-CM

## 2021-08-16 DIAGNOSIS — G894 Chronic pain syndrome: Secondary | ICD-10-CM

## 2021-08-16 MED ORDER — OXYCODONE-ACETAMINOPHEN 10-325 MG PO TABS
1.0000 | ORAL_TABLET | Freq: Four times a day (QID) | ORAL | 0 refills | Status: DC | PRN
  Filled 2021-08-18: qty 60, 15d supply, fill #0

## 2021-08-16 NOTE — Progress Notes (Signed)
Reviewed PDMP substance reporting system prior to prescribing opiate medications. No inconsistencies noted.  Patient will be able to pick up medication on 08/18/2021. Meds ordered this encounter  Medications   oxyCODONE-acetaminophen (PERCOCET) 10-325 MG tablet    Sig: Take 1 tablet by mouth every 6 (six) hours as needed for up to 15 days for pain.    Dispense:  60 tablet    Refill:  0    Order Specific Question:   Supervising Provider    Answer:   Tresa Garter [[id number]]   [name]  APRN, MSN, FNP-C Patient [facility] [address] [place], [place] [address] [phone]

## 2021-08-16 NOTE — Telephone Encounter (Signed)
Oxycodone percocet refill request please send to [facility]

## 2021-08-18 ENCOUNTER — Other Ambulatory Visit (HOSPITAL_COMMUNITY): Payer: Self-pay

## 2021-08-23 ENCOUNTER — Other Ambulatory Visit: Payer: Self-pay | Admitting: Internal Medicine

## 2021-08-23 DIAGNOSIS — D571 Sickle-cell disease without crisis: Secondary | ICD-10-CM

## 2021-08-23 NOTE — Telephone Encounter (Signed)
error 

## 2021-08-24 NOTE — Telephone Encounter (Signed)
Prescription sent to wrong office. Rerouted to office and PCP.

## 2021-08-30 ENCOUNTER — Other Ambulatory Visit: Payer: Self-pay | Admitting: Internal Medicine

## 2021-08-30 ENCOUNTER — Telehealth: Payer: Self-pay

## 2021-08-30 NOTE — Telephone Encounter (Signed)
Percocet

## 2021-09-02 ENCOUNTER — Other Ambulatory Visit: Payer: Self-pay | Admitting: Internal Medicine

## 2021-09-02 ENCOUNTER — Other Ambulatory Visit (HOSPITAL_COMMUNITY): Payer: Self-pay

## 2021-09-02 DIAGNOSIS — D571 Sickle-cell disease without crisis: Secondary | ICD-10-CM

## 2021-09-02 DIAGNOSIS — G894 Chronic pain syndrome: Secondary | ICD-10-CM

## 2021-09-02 MED ORDER — OXYCODONE-ACETAMINOPHEN 10-325 MG PO TABS: 1.0000 | ORAL_TABLET | Freq: Four times a day (QID) | ORAL | 0 refills | Status: DC | PRN

## 2021-09-03 ENCOUNTER — Non-Acute Institutional Stay (HOSPITAL_COMMUNITY)
Admission: AD | Admit: 2021-09-03 | Discharge: 2021-09-03 | Disposition: A | Discharge status: Home / Self Care | Payer: 59 | Source: Ambulatory Visit | Attending: Internal Medicine | Admitting: Internal Medicine

## 2021-09-03 ENCOUNTER — Other Ambulatory Visit (HOSPITAL_COMMUNITY): Payer: Self-pay

## 2021-09-03 ENCOUNTER — Other Ambulatory Visit: Payer: Self-pay | Admitting: Internal Medicine

## 2021-09-03 ENCOUNTER — Telehealth (HOSPITAL_COMMUNITY): Payer: Self-pay | Admitting: *Deleted

## 2021-09-03 DIAGNOSIS — D5700 Hb-SS disease with crisis, unspecified: Secondary | ICD-10-CM | POA: Diagnosis present

## 2021-09-03 DIAGNOSIS — G894 Chronic pain syndrome: Secondary | ICD-10-CM

## 2021-09-03 DIAGNOSIS — Z87891 Personal history of nicotine dependence: Secondary | ICD-10-CM | POA: Diagnosis not present

## 2021-09-03 DIAGNOSIS — D571 Sickle-cell disease without crisis: Secondary | ICD-10-CM

## 2021-09-03 DIAGNOSIS — Z79891 Long term (current) use of opiate analgesic: Secondary | ICD-10-CM | POA: Diagnosis not present

## 2021-09-03 DIAGNOSIS — D638 Anemia in other chronic diseases classified elsewhere: Secondary | ICD-10-CM | POA: Insufficient documentation

## 2021-09-03 LAB — CBC WITH DIFFERENTIAL/PLATELET
Abs Immature Granulocytes: 0.04 10*3/uL (ref 0.00–0.07)
Basophils Absolute: 0 10*3/uL (ref 0.0–0.1)
Basophils Relative: 0 %
Eosinophils Absolute: 0.5 10*3/uL (ref 0.0–0.5)
Eosinophils Relative: 4 %
HCT: 30.6 % — ABNORMAL LOW (ref 39.0–52.0)
Hemoglobin: 10.5 g/dL — ABNORMAL LOW (ref 13.0–17.0)
Immature Granulocytes: 0 %
Lymphocytes Relative: 22 %
Lymphs Abs: 2.6 10*3/uL (ref 0.7–4.0)
MCH: 28.6 pg (ref 26.0–34.0)
MCHC: 34.3 g/dL (ref 30.0–36.0)
MCV: 83.4 fL (ref 80.0–100.0)
Monocytes Absolute: 0.8 10*3/uL (ref 0.1–1.0)
Monocytes Relative: 7 %
Neutro Abs: 7.7 10*3/uL (ref 1.7–7.7)
Neutrophils Relative %: 67 %
Platelets: 446 10*3/uL — ABNORMAL HIGH (ref 150–400)
RBC: 3.67 MIL/uL — ABNORMAL LOW (ref 4.22–5.81)
RDW: 21.5 % — ABNORMAL HIGH (ref 11.5–15.5)
WBC: 11.6 10*3/uL — ABNORMAL HIGH (ref 4.0–10.5)
nRBC: 0.6 % — ABNORMAL HIGH (ref 0.0–0.2)

## 2021-09-03 LAB — COMPREHENSIVE METABOLIC PANEL
ALT: 12 U/L (ref 0–44)
AST: 35 U/L (ref 15–41)
Albumin: 4.2 g/dL (ref 3.5–5.0)
Alkaline Phosphatase: 71 U/L (ref 38–126)
Anion gap: 9 (ref 5–15)
BUN: 7 mg/dL (ref 6–20)
CO2: 25 mmol/L (ref 22–32)
Calcium: 9 mg/dL (ref 8.9–10.3)
Chloride: 105 mmol/L (ref 98–111)
Creatinine, Ser: 0.57 mg/dL — ABNORMAL LOW (ref 0.61–1.24)
GFR, Estimated: >60 mL/min (ref >60)
Glucose, Bld: 94 mg/dL (ref 70–99)
Potassium: 3.6 mmol/L (ref 3.5–5.1)
Sodium: 139 mmol/L (ref 135–145)
Total Bilirubin: 4 mg/dL — ABNORMAL HIGH (ref 0.3–1.2)
Total Protein: 7.6 g/dL (ref 6.5–8.1)

## 2021-09-03 LAB — RETICULOCYTES
Immature Retic Fract: 40.7 % — ABNORMAL HIGH (ref 2.3–15.9)
RBC.: 3.67 MIL/uL — ABNORMAL LOW (ref 4.22–5.81)
Retic Count, Absolute: 238.9 10*3/uL — ABNORMAL HIGH (ref 19.0–186.0)
Retic Ct Pct: 6.5 % — ABNORMAL HIGH (ref 0.4–3.1)

## 2021-09-03 LAB — RAPID URINE DRUG SCREEN, HOSP PERFORMED
Amphetamines: NOT DETECTED
Barbiturates: NOT DETECTED
Benzodiazepines: NOT DETECTED
Cocaine: NOT DETECTED
Opiates: POSITIVE — AB
Tetrahydrocannabinol: POSITIVE — AB

## 2021-09-03 MED ORDER — KETOROLAC TROMETHAMINE 30 MG/ML IJ SOLN
15.0000 mg | Freq: Once | INTRAMUSCULAR | Status: AC
  Administered 2021-09-03: 15 mg via INTRAVENOUS
  Filled 2021-09-03: qty 1

## 2021-09-03 MED ORDER — ACETAMINOPHEN 500 MG PO TABS
1000.0000 mg | ORAL_TABLET | Freq: Once | ORAL | Status: AC
  Administered 2021-09-03: 1000 mg via ORAL
  Filled 2021-09-03: qty 2

## 2021-09-03 MED ORDER — HYDROMORPHONE 1 MG/ML IV SOLN
INTRAVENOUS | Status: DC
  Administered 2021-09-03: 11 mg via INTRAVENOUS
  Filled 2021-09-03: qty 30

## 2021-09-03 MED ORDER — OXYCODONE-ACETAMINOPHEN 10-325 MG PO TABS
1.0000 | ORAL_TABLET | Freq: Four times a day (QID) | ORAL | 0 refills | Status: DC | PRN
  Filled 2021-09-03: qty 60, 15d supply, fill #0

## 2021-09-03 MED ORDER — ONDANSETRON HCL 4 MG/2ML IJ SOLN
4.0000 mg | Freq: Four times a day (QID) | INTRAMUSCULAR | Status: DC | PRN
  Administered 2021-09-03: 4 mg via INTRAVENOUS
  Filled 2021-09-03: qty 2

## 2021-09-03 MED ORDER — DIPHENHYDRAMINE HCL 25 MG PO CAPS
25.0000 mg | ORAL_CAPSULE | ORAL | Status: DC | PRN
  Administered 2021-09-03: 25 mg via ORAL
  Filled 2021-09-03: qty 1

## 2021-09-03 MED ORDER — SODIUM CHLORIDE 0.45 % IV SOLN: INTRAVENOUS | Status: DC

## 2021-09-03 MED ORDER — SODIUM CHLORIDE 0.9% FLUSH: 9.0000 mL | INTRAVENOUS | Status: DC | PRN

## 2021-09-03 MED ORDER — NALOXONE HCL 0.4 MG/ML IJ SOLN: 0.4000 mg | INTRAMUSCULAR | Status: DC | PRN

## 2021-09-03 NOTE — Telephone Encounter (Signed)
Patient called requesting to come to the day infusion hospital for sickle cell pain. Patient reports back pain rated 9/10. Reports taking Morphine at 10:00 pm last night. Patient is out of short acting medication but should be able to pick it up today. COVID-19 screening done and patient denies all symptoms and exposures. Denies fever, chest pain, nausea, vomiting, diarrhea, abdominal pain and priapism. Admits to having transportation without driving self. Timothy Marks, [place] notified. Patient can come to the day hospital for pain management. Patient advised and expresses an understanding.

## 2021-09-03 NOTE — H&P (Signed)
[facility] History and Physical   Date: 09/03/2021  Patient name: Timothy Marks Medical record number: [id number] Date of birth: [birth year] Age: 31 y.o. Gender: male PCP: Tresa Garter, MD  Attending physician: Tresa Garter, MD  Chief Complaint: Sickle cell pain   History of present illness:  Timothy Marks is a 31 year old male with a medical history significant for sickle cell disease, chronic pain syndrome, opiate dependence and tolerance, history of anemia of chronic disease presents with complaints of low back and lower extremity pain that is consistent with this typical sickle cell pain crisis.  Patient states that he was in his usual state of health until awakening this a.m. with sharp pain to his lower back and lower extremities.  He had MS Contin and Percocet without any sustained relief.  He rates his pain as 9/10, constant, and occasionally sharp.  He denies any fever, chills, headache, chest pain, or shortness of breath.  No dizziness, urinary symptoms, nausea, vomiting, or diarrhea.  Meds: Medications Prior to Admission  Medication Sig Dispense Refill Last Dose   cholecalciferol (VITAMIN D) 25 MCG (1000 UNIT) tablet Take 1,000 Units by mouth daily.   Past Week   folic acid (FOLVITE) 1 MG tablet Take 1 tablet (1 mg total) by mouth daily. 90 tablet 2 09/03/2021   gabapentin (NEURONTIN) 300 MG capsule TAKE 1 CAPSULE(300 MG) BY MOUTH THREE TIMES DAILY 90 capsule 5 09/03/2021   hydroxyurea (HYDREA) 500 MG capsule Take 1 capsule (500 mg total) by mouth 2 (two) times daily. May take with food to minimize GI side effects. 180 capsule 2 09/03/2021   hydrOXYzine (ATARAX) 25 MG tablet Take 25 mg by mouth daily as needed for anxiety or vomiting.   09/02/2021   mirtazapine (REMERON) 7.5 MG tablet Take 7.5 mg by mouth at bedtime as needed (for sleep).   09/02/2021   OLANZapine (ZYPREXA) 5 MG tablet Take 1 tablet (5 mg total) by mouth at bedtime. 30 tablet 2 09/02/2021    rivaroxaban (XARELTO) 20 MG TABS tablet Take 1 tablet (20 mg total) by mouth daily with supper. 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   Sickle cell anemia (HCC)    Vitamin D deficiency y-6   Past Surgical History:  Procedure Laterality Date   CHOLECYSTECTOMY     Family History  Problem Relation Age of Onset   Sickle cell trait Mother    Sickle cell trait Father    Social History   Socioeconomic History   Marital status: Single    Spouse name: Not on file   Number of children: Not on file   Years of education: Not on file   Highest education level: Not on file  Occupational History   Not on file  Tobacco Use   Smoking status: Former    Types: Cigarettes   Smokeless tobacco: Never  Vaping Use   Vaping Use: Never used  Substance and Sexual Activity   Alcohol use: No   Drug use: Not Currently    Types: Marijuana   Sexual activity: Yes  Other Topics Concern   Not on file  Social History Narrative   Not on file   Social Determinants of Health  Financial Resource Strain: Not on file  Food Insecurity: No Food Insecurity (06/28/2021)   Hunger Vital Sign    Worried About Running Out of Food in the Last Year: Never true    Ran Out of Food in the Last Year: Never true  Transportation Needs: No Transportation Needs (06/28/2021)   PRAPARE - Hydrologist (Medical): No    Lack of Transportation (Non-Medical): No  Physical Activity: Not on file  Stress: Not on file  Social Connections: Not on file  Intimate Partner Violence: Not on file   Review of Systems  Constitutional:  Negative for chills and fever.   HENT: Negative.    Eyes: Negative.   Respiratory: Negative.    Gastrointestinal: Negative.   Genitourinary: Negative.   Musculoskeletal:  Positive for back pain and joint pain.  Skin: Negative.   Neurological: Negative.   Endo/Heme/Allergies: Negative.   Psychiatric/Behavioral: Negative.      Physical Exam: Blood pressure 109/62, pulse 73, temperature 98.5 F (36.9 C), temperature source Temporal, resp. rate 14, SpO2 96 %.  Physical Exam Constitutional:      Appearance: Normal appearance.  Eyes:     Pupils: Pupils are equal, round, and reactive to light.  Cardiovascular:     Rate and Rhythm: Normal rate and regular rhythm.     Pulses: Normal pulses.  Pulmonary:     Effort: Pulmonary effort is normal.  Abdominal:     General: Bowel sounds are normal.  Musculoskeletal:        General: Normal range of motion.  Skin:    General: Skin is warm.  Neurological:     General: No focal deficit present.     Mental Status: He is alert. Mental status is at baseline.  Psychiatric:        Mood and Affect: Mood normal.        Behavior: Behavior normal.        Thought Content: Thought content normal.        Judgment: Judgment normal.     Lab results: Results for orders placed or performed during the hospital encounter of 09/03/21 (from the past 24 hour(s))  Comprehensive metabolic panel     Status: Abnormal   Collection Time: 09/03/21  9:50 AM  Result Value Ref Range   Sodium 139 135 - 145 mmol/L   Potassium 3.6 3.5 - 5.1 mmol/L   Chloride 105 98 - 111 mmol/L   CO2 25 22 - 32 mmol/L   Glucose, Bld 94 70 - 99 mg/dL   BUN 7 6 - 20 mg/dL   Creatinine, Ser 0.57 (L) 0.61 - 1.24 mg/dL   Calcium 9.0 8.9 - 10.3 mg/dL   Total Protein 7.6 6.5 - 8.1 g/dL   Albumin 4.2 3.5 - 5.0 g/dL   AST 35 15 - 41 U/L   ALT 12 0 - 44 U/L   Alkaline Phosphatase 71 38 - 126 U/L   Total Bilirubin 4.0 (H) 0.3 - 1.2 mg/dL   GFR, Estimated >60 >60 mL/min   Anion gap 9 5 - 15  CBC with Differential      Status: Abnormal   Collection Time: 09/03/21  9:50 AM  Result Value Ref Range   WBC 11.6 (H) 4.0 - 10.5 K/uL   RBC 3.67 (L) 4.22 - 5.81 MIL/uL   Hemoglobin 10.5 (L) 13.0 - 17.0 g/dL   HCT 30.6 (L) 39.0 - 52.0 %   MCV 83.4 80.0 - 100.0 fL   MCH  28.6 26.0 - 34.0 pg   MCHC 34.3 30.0 - 36.0 g/dL   RDW 21.5 (H) 11.5 - 15.5 %   Platelets 446 (H) 150 - 400 K/uL   nRBC 0.6 (H) 0.0 - 0.2 %   Neutrophils Relative % 67 %   Neutro Abs 7.7 1.7 - 7.7 K/uL   Lymphocytes Relative 22 %   Lymphs Abs 2.6 0.7 - 4.0 K/uL   Monocytes Relative 7 %   Monocytes Absolute 0.8 0.1 - 1.0 K/uL   Eosinophils Relative 4 %   Eosinophils Absolute 0.5 0.0 - 0.5 K/uL   Basophils Relative 0 %   Basophils Absolute 0.0 0.0 - 0.1 K/uL   Immature Granulocytes 0 %   Abs Immature Granulocytes 0.04 0.00 - 0.07 K/uL   Polychromasia PRESENT    Sickle Cells PRESENT    Target Cells PRESENT   Reticulocytes     Status: Abnormal   Collection Time: 09/03/21  9:50 AM  Result Value Ref Range   Retic Ct Pct 6.5 (H) 0.4 - 3.1 %   RBC. 3.67 (L) 4.22 - 5.81 MIL/uL   Retic Count, Absolute 238.9 (H) 19.0 - 186.0 K/uL   Immature Retic Fract 40.7 (H) 2.3 - 15.9 %  Rapid urine drug screen (hospital performed)     Status: Abnormal   Collection Time: 09/03/21 10:30 AM  Result Value Ref Range   Opiates POSITIVE (A) NONE DETECTED   Cocaine NONE DETECTED NONE DETECTED   Benzodiazepines NONE DETECTED NONE DETECTED   Amphetamines NONE DETECTED NONE DETECTED   Tetrahydrocannabinol POSITIVE (A) NONE DETECTED   Barbiturates NONE DETECTED NONE DETECTED    Imaging results:  No results found.   Assessment & Plan: Patient admitted to sickle cell day infusion center for management of pain crisis.  Patient is opiate tolerant  initiate IV dilaudid PCA.  IV fluids, 0.45% saline at 100 mL/h Toradol 15 mg IV times one dose Tylenol 1000 mg by mouth times one dose Review CBC with differential, complete metabolic panel, and reticulocytes as  results become available. Pain intensity will be reevaluated in context of functioning and relationship to baseline as care progresses If pain intensity remains elevated and/or sudden change in hemodynamic stability transition to inpatient services for higher level of care.      [name]  APRN, MSN, FNP-C [facility] [address] [place], [place] [address] [phone]  09/03/2021, 3:33 PM

## 2021-09-03 NOTE — Progress Notes (Signed)
Patient admitted to the day infusion hospital for sickle cell pain. Initially, patient reported back pain rated 9/10. For pain management, patient placed on Dilaudid PCA, given Tylenol, Toradol and hydrated with IV fluids. At discharge, patient rated pain at 6/10. Vital signs stable. AVS offered but patient refused. Patient alert, oriented and ambulatory at discharge.

## 2021-09-03 NOTE — Discharge Summary (Signed)
[facility] Discharge Summary   Patient ID: Timothy Marks MRN: [id number] DOB/AGE: 33/05/1990 33 y.o.  Admit date: 09/03/2021 Discharge date: 09/03/2021  Primary Care Physician:  Tresa Garter, MD  Admission Diagnoses:  Principal Problem:   Sickle cell pain crisis [facility])   Discharge Medications:  Allergies as of 09/03/2021       Reactions   Buprenorphine Hcl Hives   Levaquin [levofloxacin] Itching   Meperidine Rash   Morphine Hives   Morphine And Related Hives   Tramadol Hives   Vancomycin Itching   Zosyn [piperacillin Sod-tazobactam So] Itching, Rash, Other (See Comments)   Has taken rocephin in the past        Medication List     TAKE these medications    cholecalciferol 25 MCG (1000 UNIT) tablet Commonly known as: VITAMIN D Take 1,000 Units by mouth daily.   folic acid 1 MG tablet Commonly known as: FOLVITE Take 1 tablet (1 mg total) by mouth daily.   gabapentin 300 MG capsule Commonly known as: NEURONTIN TAKE 1 CAPSULE(300 MG) BY MOUTH THREE TIMES DAILY   hydroxyurea 500 MG capsule Commonly known as: HYDREA Take 1 capsule (500 mg total) by mouth 2 (two) times daily. May take with food to minimize GI side effects.   hydrOXYzine 25 MG tablet Commonly known as: ATARAX Take 25 mg by mouth daily as needed for anxiety or vomiting.   mirtazapine 7.5 MG tablet Commonly known as: REMERON Take 7.5 mg by mouth at bedtime as needed (for sleep).   OLANZapine 5 MG tablet Commonly known as: ZYPREXA Take 1 tablet (5 mg total) by mouth at bedtime.   oxyCODONE-acetaminophen 10-325 MG tablet Commonly known as: PERCOCET Take 1 tablet by mouth every 6 (six) hours as needed for up to 15 days for pain.   rivaroxaban 20 MG Tabs tablet Commonly known as: Xarelto Take 1 tablet (20 mg total) by mouth daily with supper.         Consults:  None  Significant Diagnostic Studies:  No results found.  History of present illness:  Timothy Marks is a 32 year old male with a medical history significant for sickle cell disease, chronic pain syndrome, opiate dependence and tolerance, history of anemia of chronic disease presents with complaints of low back and lower extremity pain that is consistent with this typical sickle cell pain crisis.  Patient states that he was in his usual state of health until awakening this a.m. with sharp pain to his lower back and lower extremities.  He had MS Contin and Percocet without any sustained relief.  He rates his pain as 9/10, constant, and occasionally sharp.  He denies any fever, chills, headache, chest pain, or shortness of breath.  No dizziness, urinary symptoms, nausea, vomiting, or diarrhea.  [facility] Course: Patient admitted to sickle cell day infusion clinic for management of pain crisis. Reviewed all laboratory values, largely consistent with patient's baseline Pain managed with IV Dilaudid PCA, IV Toradol, IV fluids, and Tylenol. Pain intensity decreased throughout admission and patient is requesting discharge home. He is alert, oriented, and ambulating without assistance. Patient will discharge home in a hemodynamically stable condition  Discharge instructions: Resume all home medications.   Follow up with PCP as previously  scheduled.   Discussed the importance of drinking 64 ounces of water daily, dehydration of red blood cells may lead further sickling.   Avoid all stressors that precipitate sickle cell pain crisis.     The patient  was given clear instructions to go to ER or return to medical center if symptoms do not improve, worsen or new problems develop.   Physical Exam at Discharge:  BP 109/62 (BP Location: Left Arm)   Pulse 73   Temp 98.5 F (36.9 C) (Temporal)   Resp 14   SpO2 96%  Physical Exam Constitutional:      Appearance: Normal appearance.  Eyes:     Pupils: Pupils are equal, round, and reactive to light.  Cardiovascular:     Rate  and Rhythm: Normal rate and regular rhythm.     Pulses: Normal pulses.  Pulmonary:     Effort: Pulmonary effort is normal.  Abdominal:     General: Bowel sounds are normal.  Musculoskeletal:        General: Normal range of motion.  Skin:    General: Skin is warm.  Neurological:     General: No focal deficit present.     Mental Status: He is alert.  Psychiatric:        Mood and Affect: Mood normal.        Behavior: Behavior normal.        Thought Content: Thought content normal.        Judgment: Judgment normal.      Disposition at Discharge: Discharge disposition: 01-Home or Self Care       Discharge Orders: Discharge Instructions     Discharge patient   Complete by: As directed    Discharge disposition: 01-Home or Self Care   Discharge patient date: 09/03/2021       Condition at Discharge:   Stable  Time spent on Discharge:  Greater than 30 minutes.  Signed: [name]  APRN, MSN, FNP-C Patient [facility] [address] [place], [place] [address] [phone]  09/03/2021, 3:29 PM

## 2021-09-09 ENCOUNTER — Telehealth: Payer: Self-pay

## 2021-09-09 NOTE — Telephone Encounter (Signed)
Morphine

## 2021-09-11 ENCOUNTER — Other Ambulatory Visit: Payer: Self-pay

## 2021-09-11 ENCOUNTER — Encounter (HOSPITAL_BASED_OUTPATIENT_CLINIC_OR_DEPARTMENT_OTHER): Payer: Self-pay | Admitting: Emergency Medicine

## 2021-09-11 ENCOUNTER — Emergency Department (HOSPITAL_BASED_OUTPATIENT_CLINIC_OR_DEPARTMENT_OTHER)
Admission: EM | Admit: 2021-09-11 | Discharge: 2021-09-11 | Disposition: A | Discharge status: Home / Self Care | Payer: 59 | Attending: Emergency Medicine | Admitting: Emergency Medicine

## 2021-09-11 DIAGNOSIS — D5700 Hb-SS disease with crisis, unspecified: Secondary | ICD-10-CM | POA: Diagnosis present

## 2021-09-11 DIAGNOSIS — D649 Anemia, unspecified: Secondary | ICD-10-CM | POA: Insufficient documentation

## 2021-09-11 DIAGNOSIS — Z7901 Long term (current) use of anticoagulants: Secondary | ICD-10-CM | POA: Insufficient documentation

## 2021-09-11 LAB — CBC WITH DIFFERENTIAL/PLATELET
Abs Immature Granulocytes: 0.03 10*3/uL (ref 0.00–0.07)
Basophils Absolute: 0 10*3/uL (ref 0.0–0.1)
Basophils Relative: 1 %
Eosinophils Absolute: 0.4 10*3/uL (ref 0.0–0.5)
Eosinophils Relative: 4 %
HCT: 29.5 % — ABNORMAL LOW (ref 39.0–52.0)
Hemoglobin: 10.2 g/dL — ABNORMAL LOW (ref 13.0–17.0)
Immature Granulocytes: 0 %
Lymphocytes Relative: 37 %
Lymphs Abs: 3.2 10*3/uL (ref 0.7–4.0)
MCH: 29.5 pg (ref 26.0–34.0)
MCHC: 34.6 g/dL (ref 30.0–36.0)
MCV: 85.3 fL (ref 80.0–100.0)
Monocytes Absolute: 0.7 10*3/uL (ref 0.1–1.0)
Monocytes Relative: 8 %
Neutro Abs: 4.2 10*3/uL (ref 1.7–7.7)
Neutrophils Relative %: 50 %
Platelets: 489 10*3/uL — ABNORMAL HIGH (ref 150–400)
RBC: 3.46 MIL/uL — ABNORMAL LOW (ref 4.22–5.81)
RDW: 19.8 % — ABNORMAL HIGH (ref 11.5–15.5)
WBC: 8.6 10*3/uL (ref 4.0–10.5)
nRBC: 2.4 % — ABNORMAL HIGH (ref 0.0–0.2)

## 2021-09-11 LAB — COMPREHENSIVE METABOLIC PANEL
ALT: 9 U/L (ref 0–44)
AST: 26 U/L (ref 15–41)
Albumin: 4.8 g/dL (ref 3.5–5.0)
Alkaline Phosphatase: 66 U/L (ref 38–126)
Anion gap: 11 (ref 5–15)
BUN: 6 mg/dL (ref 6–20)
CO2: 26 mmol/L (ref 22–32)
Calcium: 9.9 mg/dL (ref 8.9–10.3)
Chloride: 103 mmol/L (ref 98–111)
Creatinine, Ser: 0.63 mg/dL (ref 0.61–1.24)
GFR, Estimated: >60 mL/min (ref >60)
Glucose, Bld: 82 mg/dL (ref 70–99)
Potassium: 3.4 mmol/L — ABNORMAL LOW (ref 3.5–5.1)
Sodium: 140 mmol/L (ref 135–145)
Total Bilirubin: 2.5 mg/dL — ABNORMAL HIGH (ref 0.3–1.2)
Total Protein: 7.4 g/dL (ref 6.5–8.1)

## 2021-09-11 LAB — RETICULOCYTES
Immature Retic Fract: 37.3 % — ABNORMAL HIGH (ref 2.3–15.9)
RBC.: 3.48 MIL/uL — ABNORMAL LOW (ref 4.22–5.81)
Retic Count, Absolute: 279.8 10*3/uL — ABNORMAL HIGH (ref 19.0–186.0)
Retic Ct Pct: 8 % — ABNORMAL HIGH (ref 0.4–3.1)

## 2021-09-11 MED ORDER — RIVAROXABAN 20 MG PO TABS
20.0000 mg | ORAL_TABLET | Freq: Once | ORAL | Status: AC
  Administered 2021-09-11: 20 mg via ORAL
  Filled 2021-09-11: qty 1

## 2021-09-11 MED ORDER — HYDROMORPHONE HCL 1 MG/ML IJ SOLN
1.0000 mg | INTRAMUSCULAR | Status: AC
  Administered 2021-09-11: 1 mg via INTRAVENOUS
  Filled 2021-09-11 (×2): qty 1

## 2021-09-11 MED ORDER — HYDROMORPHONE HCL 1 MG/ML IJ SOLN
1.0000 mg | INTRAMUSCULAR | Status: AC
  Administered 2021-09-11: 1 mg via INTRAVENOUS
  Filled 2021-09-11: qty 1

## 2021-09-11 MED ORDER — PROMETHAZINE HCL 25 MG PO TABS
25.0000 mg | ORAL_TABLET | ORAL | Status: DC | PRN
  Administered 2021-09-11: 25 mg via ORAL
  Filled 2021-09-11: qty 1

## 2021-09-11 MED ORDER — KETAMINE HCL 10 MG/ML IJ SOLN
0.2000 mg/kg | Freq: Once | INTRAMUSCULAR | Status: AC
  Administered 2021-09-11: 12 mg via INTRAVENOUS
  Filled 2021-09-11: qty 1

## 2021-09-11 MED ORDER — KETOROLAC TROMETHAMINE 15 MG/ML IJ SOLN
15.0000 mg | INTRAMUSCULAR | Status: AC
  Administered 2021-09-11: 15 mg via INTRAVENOUS
  Filled 2021-09-11: qty 1

## 2021-09-11 NOTE — ED Provider Notes (Signed)
31 year old gentleman presenting to ER for sickle cell pain crisis.  At time of signout awaiting reassessment, CMP.  CMP is grossly stable.  His T. bili is chronically elevated, improved from prior.  Slight hypokalemia.  I reassessed patient, he states that his pain has improved but now feels like his pain is returning and would like to try a dose of ketamine for pain control.  States that he has tolerated this very well previously.  We will give him a pain dose of ketamine and reassess, if pain well controlled anticipate discharge.   Lucrezia Starch, MD 09/11/21 1000

## 2021-09-11 NOTE — ED Notes (Signed)
Informed MD protocol for dilaudid/sickle cell has been completed and pt still asking for pain meds

## 2021-09-11 NOTE — ED Triage Notes (Signed)
  Patient comes in with lower back pain and R leg pain that started a few hours ago.  Patient has sickle cell disease and has been waiting with family member getting admitted.  Patient endorses throbbing pain in lower back that radiates down R leg.  Pain 9/10.

## 2021-09-11 NOTE — ED Provider Notes (Signed)
[facility] [facility] EMERGENCY DEPT Provider Note   CSN: [id number] Arrival date & time: 09/11/21  0447     History  Chief Complaint  Patient presents with   Sickle Cell Pain Crisis    Timothy Marks is a 31 y.o. male.  The history is provided by the patient.  Sickle Cell Pain Crisis Location:  Back and lower extremity Severity:  Moderate Onset quality:  Gradual Timing:  Constant Progression:  Worsening Chronicity:  Recurrent Relieved by:  Nothing Associated symptoms: no chest pain, no fever, no shortness of breath and no vomiting   Patient with history of sickle cell disease presents with pain.  He reports over the past several hours he has had low back pain as well as right leg pain.  This is similar to prior episodes.  No falls or trauma.  No focal weakness. He reports he just recently ran out of his prescriptions and will be unable to get them filled until another 48-hour No fevers or vomiting.  No chest pain or shortness of breath.    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 Authorizing Provider  cholecalciferol (VITAMIN D) 25 MCG (1000 UNIT) tablet Take 1,000 Units by mouth daily.    [provider]  folic acid (FOLVITE) 1 MG tablet Take 1 tablet (1 mg total) by mouth daily. 07/05/21   Dorena Dew, FNP  gabapentin (NEURONTIN) 300 MG capsule TAKE 1 CAPSULE(300 MG) BY MOUTH THREE TIMES DAILY 08/24/21   Tresa Garter, MD  hydroxyurea (HYDREA) 500 MG capsule Take 1 capsule (500 mg total) by mouth 2 (two) times daily. May take with food to minimize GI side effects. 05/07/20   Tresa Garter, MD  hydrOXYzine (ATARAX) 25 MG tablet Take 25 mg by mouth  daily as needed for anxiety or vomiting. 05/19/21   [provider]  mirtazapine (REMERON) 7.5 MG tablet Take 7.5 mg by mouth at bedtime as needed (for sleep). 05/19/21   [provider]  OLANZapine (ZYPREXA) 5 MG tablet Take 1 tablet (5 mg total) by mouth at bedtime. 05/07/21   Dorena Dew, FNP  oxyCODONE-acetaminophen (PERCOCET) 10-325 MG tablet Take 1 tablet by mouth every 6 (six) hours as needed for up to 15 days for pain. 09/03/21 09/18/21  Tresa Garter, MD  rivaroxaban (XARELTO) 20 MG TABS tablet Take 1 tablet (20 mg total) by mouth daily with supper. 07/05/21   Dorena Dew, FNP      Allergies    Buprenorphine hcl, Levaquin [levofloxacin], Meperidine, Morphine, Morphine and related, Tramadol, Vancomycin, and Zosyn [piperacillin sod-tazobactam so]    Review of Systems   Review of Systems  Constitutional:  Negative for fever.  Respiratory:  Negative for shortness of breath.   Cardiovascular:  Negative for chest pain.  Gastrointestinal:  Negative for abdominal pain and vomiting.  Musculoskeletal:  Positive for arthralgias and back pain.  Neurological:  Negative for weakness.    Physical Exam Updated Vital Signs BP 107/73   Pulse 62   Temp 98.4 F (36.9 C) (Oral)   Resp 14   Ht 1.88 m ('6\' 2"'$ )   Wt 61.2 kg   SpO2 100%  BMI 17.33 kg/m  Physical Exam CONSTITUTIONAL: Thin appearing uncomfortable appearing HEAD: Normocephalic/atraumatic EYES: EOMI/PERRL ENMT: Mucous membranes moist NECK: supple no meningeal signs SPINE/BACK: Lumbar tenderness, lumbar paraspinal tenderness No bruising/crepitance/stepoffs noted to spine CV: S1/S2 noted LUNGS: Lungs are clear to auscultation bilaterally, no apparent distress ABDOMEN: soft, nontender, no rebound or guarding, bowel sounds noted throughout abdomen GU:no cva tenderness NEURO: Pt is awake/alert/appropriate, moves all extremitiesx4.  No facial droop.  No focal arm or leg weakness is noted EXTREMITIES:  pulses normal/equal, full ROM, tenderness noted to right distal thigh and right popliteal fossa.  Distal pulses equal and intact.  No erythema or edema to his lower extremities.  There is no thrill noted to the popliteal 5 SKIN: warm, color normal PSYCH: no abnormalities of mood noted, alert and oriented to situation  ED Results / Procedures / Treatments   Labs (all labs ordered are listed, but only abnormal results are displayed) Labs Reviewed  CBC WITH DIFFERENTIAL/PLATELET - Abnormal; Notable for the following components:      Result Value   RBC 3.46 (*)    Hemoglobin 10.2 (*)    HCT 29.5 (*)    RDW 19.8 (*)    Platelets 489 (*)    nRBC 2.4 (*)    All other components within normal limits  RETICULOCYTES - Abnormal; Notable for the following components:   Retic Ct Pct 8.0 (*)    RBC. 3.48 (*)    Retic Count, Absolute 279.8 (*)    Immature Retic Fract 37.3 (*)    All other components within normal limits  COMPREHENSIVE METABOLIC PANEL    EKG None  Radiology No results found.  Procedures Procedures    Medications Ordered in ED Medications  HYDROmorphone (DILAUDID) injection 1 mg (has no administration in time range)  promethazine (PHENERGAN) tablet 25 mg (25 mg Oral Given 09/11/21 0631)  HYDROmorphone (DILAUDID) injection 1 mg (1 mg Intravenous Given 09/11/21 0550)  HYDROmorphone (DILAUDID) injection 1 mg (1 mg Intravenous Given 09/11/21 0624)  ketorolac (TORADOL) 15 MG/ML injection 15 mg (15 mg Intravenous Given 09/11/21 0549)  rivaroxaban (XARELTO) tablet 20 mg (20 mg Oral Given 09/11/21 1191)    ED Course/ Medical Decision Making/ A&P Clinical Course as of 09/11/21 0703  Sat Sep 11, 2021  0547 Hemoglobin(!): 10.2 Chronic anemia [DW]  0547 Patient presents to ER with with his pain.  Over the past several hours.  Reports that this is typical pain in his back of his leg.  Plan to treat pain and reassess [DW]  0654 Patient feeling improved, but still having pain.  Will  give another round of medicine [DW]  0702 Signed out to dr Roslynn Amble at shift change to reassess pain  [DW]    Clinical Course User Index [DW] Ripley Fraise, MD                           Medical Decision Making Amount and/or Complexity of Data Reviewed Labs: ordered. Decision-making details documented in ED Course.  Risk Prescription drug management.   This patient presents to the ED for concern of sickle cell disease, back pain, leg pain, this involves an extensive number of treatment options, and is a complaint that carries with it a high risk of complications and morbidity.  The differential diagnosis includes but is not limited to pain crisis, muscle strain, epidural abscess, discitis, DVT  Comorbidities that complicate the patient evaluation: Patient's presentation is complicated by their history of  sickle cell disease  Additional history obtained: Records reviewed previous admission documents  Lab Tests: I Ordered, and personally interpreted labs.  The pertinent results include:  anemia   Medicines ordered and prescription drug management: I ordered medication including Dilaudid for pain Reevaluation of the patient after these medicines showed that the patient    improved  Reevaluation: After the interventions noted above, I reevaluated the patient and found that they have :improved  Complexity of problems addressed: Patient's presentation is most consistent with  acute presentation with potential threat to life or bodily function            Final Clinical Impression(s) / ED Diagnoses Final diagnoses:  Sickle cell disease with crisis [facility])    Rx / [place] Orders ED Discharge Orders     None         Ripley Fraise, MD 09/11/21 [phone]

## 2021-09-11 NOTE — Discharge Instructions (Signed)
Follow-up with the sickle cell clinic.  Come back to ER if you develop worsening pain, chest pain, difficulty breathing, fever or other new concerning symptom.

## 2021-09-13 ENCOUNTER — Other Ambulatory Visit: Payer: Self-pay | Admitting: Internal Medicine

## 2021-09-13 ENCOUNTER — Encounter (HOSPITAL_BASED_OUTPATIENT_CLINIC_OR_DEPARTMENT_OTHER): Payer: Self-pay

## 2021-09-13 ENCOUNTER — Other Ambulatory Visit: Payer: Self-pay

## 2021-09-13 ENCOUNTER — Emergency Department (HOSPITAL_BASED_OUTPATIENT_CLINIC_OR_DEPARTMENT_OTHER)
Admission: EM | Admit: 2021-09-13 | Discharge: 2021-09-13 | Disposition: A | Discharge status: Home / Self Care | Payer: 59 | Attending: Emergency Medicine | Admitting: Emergency Medicine

## 2021-09-13 DIAGNOSIS — G894 Chronic pain syndrome: Secondary | ICD-10-CM

## 2021-09-13 DIAGNOSIS — D5700 Hb-SS disease with crisis, unspecified: Secondary | ICD-10-CM

## 2021-09-13 MED ORDER — OXYCODONE-ACETAMINOPHEN 5-325 MG PO TABS
2.0000 | ORAL_TABLET | Freq: Once | ORAL | Status: AC
  Administered 2021-09-13: 2 via ORAL
  Filled 2021-09-13: qty 2

## 2021-09-13 MED ORDER — KETAMINE HCL 50 MG/ML IJ SOLN
1.5000 mg/kg | Freq: Once | INTRAMUSCULAR | Status: AC
  Administered 2021-09-13: 90 mg via INTRAMUSCULAR
  Filled 2021-09-13: qty 10

## 2021-09-13 MED ORDER — MORPHINE SULFATE ER 15 MG PO TBCR: 15.0000 mg | EXTENDED_RELEASE_TABLET | Freq: Two times a day (BID) | ORAL | 0 refills | Status: DC

## 2021-09-13 MED ORDER — ONDANSETRON 4 MG PO TBDP
8.0000 mg | ORAL_TABLET | Freq: Once | ORAL | Status: AC
Start: 2021-09-13 — End: 2021-09-13
  Administered 2021-09-13: 8 mg via ORAL
  Filled 2021-09-13: qty 2

## 2021-09-13 MED ORDER — MORPHINE SULFATE ER 15 MG PO TBCR
15.0000 mg | EXTENDED_RELEASE_TABLET | Freq: Two times a day (BID) | ORAL | Status: DC
Start: 2021-09-13 — End: 2021-09-13

## 2021-09-13 MED ORDER — HYDROMORPHONE HCL 1 MG/ML IJ SOLN
2.0000 mg | Freq: Once | INTRAMUSCULAR | Status: AC
  Administered 2021-09-13: 2 mg via INTRAMUSCULAR
  Filled 2021-09-13: qty 2

## 2021-09-13 NOTE — ED Provider Notes (Signed)
[facility] EMERGENCY DEPT  Provider Note  CSN: [id number] Arrival date & time: 09/13/21 0159  History Chief Complaint  Patient presents with   Sickle Cell Pain Crisis    Bryn Perkin is a 31 y.o. male with history of sickle cell and chronic pain has been out of his long acting morphine for the last few days, scheduled for refill this week. Reports persistent low back pain similar to previous. No fever, SOB or chest pain. Seen in ED 2 days ago for similar, has most relief with Ketamine as opposed to opiates. Labs then were at baseline.    Home Medications Prior to Admission medications   Medication Sig Start Date End Date Taking? Authorizing Provider  cholecalciferol (VITAMIN D) 25 MCG (1000 UNIT) tablet Take 1,000 Units by mouth daily.    [provider]  folic acid (FOLVITE) 1 MG tablet Take 1 tablet (1 mg total) by mouth daily. 07/05/21   Dorena Dew, FNP  gabapentin (NEURONTIN) 300 MG capsule TAKE 1 CAPSULE(300 MG) BY MOUTH THREE TIMES DAILY 08/24/21   Tresa Garter, MD  hydroxyurea (HYDREA) 500 MG capsule Take 1 capsule (500 mg total) by mouth 2 (two) times daily. May take with food to minimize GI side effects. 05/07/20   Tresa Garter, MD  hydrOXYzine (ATARAX) 25 MG tablet Take 25 mg by mouth daily as needed for anxiety or vomiting. 05/19/21   [provider]  mirtazapine (REMERON) 7.5 MG tablet Take 7.5 mg by mouth at bedtime as needed (for sleep). 05/19/21   [provider]  OLANZapine (ZYPREXA) 5 MG tablet Take 1 tablet (5 mg total) by mouth at bedtime. 05/07/21   Dorena Dew, FNP  oxyCODONE-acetaminophen (PERCOCET) 10-325 MG tablet Take 1 tablet by mouth every 6 (six) hours as needed for up to 15 days for pain. 09/03/21 09/18/21  Tresa Garter, MD  rivaroxaban (XARELTO) 20 MG TABS tablet Take 1 tablet (20 mg total) by mouth daily with supper. 07/05/21   Dorena Dew, FNP     Allergies    Buprenorphine hcl,  Levaquin [levofloxacin], Meperidine, Morphine, Morphine and related, Tramadol, Vancomycin, and Zosyn [piperacillin sod-tazobactam so]   Review of Systems   Review of Systems Please see HPI for pertinent positives and negatives  Physical Exam BP 118/73   Pulse 66   Temp 98.8 F (37.1 C) (Oral)   Resp (!) 22   Ht '6\' 2"'$  (1.88 m)   Wt 61.2 kg   SpO2 98%   BMI 17.33 kg/m   Physical Exam Vitals and nursing note reviewed.  Constitutional:      Appearance: Normal appearance.  HENT:     Head: Normocephalic and atraumatic.     Nose: Nose normal.     Mouth/Throat:     Mouth: Mucous membranes are moist.  Eyes:     Extraocular Movements: Extraocular movements intact.     Conjunctiva/sclera: Conjunctivae normal.  Cardiovascular:     Rate and Rhythm: Normal rate.  Pulmonary:     Effort: Pulmonary effort is normal.     Breath sounds: Normal breath sounds.  Abdominal:     General: Abdomen is flat.     Palpations: Abdomen is soft.     Tenderness: There is no abdominal tenderness.  Musculoskeletal:        General: No swelling. Normal range of motion.     Cervical back: Neck supple.  Skin:    General: Skin is warm and dry.  Neurological:  General: No focal deficit present.     Mental Status: He is alert.  Psychiatric:        Mood and Affect: Mood normal.     ED Results / Procedures / Treatments   EKG None  Procedures Procedures  Medications Ordered in the ED Medications  ketamine (KETALAR) injection 90 mg (90 mg Intramuscular Given 09/13/21 0234)  oxyCODONE-acetaminophen (PERCOCET/ROXICET) 5-325 MG per tablet 2 tablet (2 tablets Oral Given 09/13/21 0235)  HYDROmorphone (DILAUDID) injection 2 mg (2 mg Intramuscular Given 09/13/21 0338)  ondansetron (ZOFRAN-ODT) disintegrating tablet 8 mg (8 mg Oral Given 09/13/21 0345)    Initial Impression and Plan  Patient with thorough lab evaluation 2 days ago, no indication to repeat today. Given his prior relief with Ketamine will  give a dose of that IM tonight as well as his usual oral meds including MSContin. Hopefully that will be sufficient to get him through the night to [facility] regarding his refills.   ED Course   Clinical Course as of 09/13/21 0422  Mon Sep 13, 2021  0235 MSContin is not available here.  [CS]  [id number] Patient reports minimal improvement after Ketamine. Will give IM Dilaudid and reassess.  [CS]  0421 Patient reports pain is controlled to where he is comfortable managing his symptoms at home and following up [facility] later this morning.  [CS]    Clinical Course User Index [CS] Truddie Hidden, MD     MDM Rules/Calculators/A&P Medical Decision Making Problems Addressed: Chronic pain syndrome: chronic illness or injury Sickle cell disease with crisis [facility]): chronic illness or injury  Amount and/or Complexity of Data Reviewed External Data Reviewed: labs.  Risk Prescription drug management.    Final Clinical Impression(s) / ED Diagnoses Final diagnoses:  Chronic pain syndrome  Sickle cell disease with crisis [facility])    Rx / DC Orders ED Discharge Orders     None        Truddie Hidden, MD 09/13/21 0422

## 2021-09-13 NOTE — ED Triage Notes (Signed)
Pt presents to the ED with lower back pain that started about 2 hours ago. Reports this to be a sickle cell pain crisis. Pt reports driving himself to the ED. Pt A&Ox4 at time of triage. VSS.

## 2021-09-14 ENCOUNTER — Non-Acute Institutional Stay (HOSPITAL_COMMUNITY)
Admission: AD | Admit: 2021-09-14 | Discharge: 2021-09-14 | Disposition: A | Discharge status: Home / Self Care | Payer: 59 | Source: Ambulatory Visit | Attending: Internal Medicine | Admitting: Internal Medicine

## 2021-09-14 ENCOUNTER — Encounter (HOSPITAL_COMMUNITY): Payer: Self-pay | Admitting: Internal Medicine

## 2021-09-14 ENCOUNTER — Telehealth (HOSPITAL_COMMUNITY): Payer: Self-pay | Admitting: General Practice

## 2021-09-14 DIAGNOSIS — F32A Depression, unspecified: Secondary | ICD-10-CM | POA: Insufficient documentation

## 2021-09-14 DIAGNOSIS — D638 Anemia in other chronic diseases classified elsewhere: Secondary | ICD-10-CM | POA: Insufficient documentation

## 2021-09-14 DIAGNOSIS — Z87891 Personal history of nicotine dependence: Secondary | ICD-10-CM | POA: Diagnosis not present

## 2021-09-14 DIAGNOSIS — G894 Chronic pain syndrome: Secondary | ICD-10-CM | POA: Diagnosis not present

## 2021-09-14 DIAGNOSIS — F913 Oppositional defiant disorder: Secondary | ICD-10-CM | POA: Diagnosis not present

## 2021-09-14 DIAGNOSIS — Z7901 Long term (current) use of anticoagulants: Secondary | ICD-10-CM | POA: Diagnosis not present

## 2021-09-14 DIAGNOSIS — F1120 Opioid dependence, uncomplicated: Secondary | ICD-10-CM | POA: Diagnosis not present

## 2021-09-14 DIAGNOSIS — Z79899 Other long term (current) drug therapy: Secondary | ICD-10-CM | POA: Insufficient documentation

## 2021-09-14 DIAGNOSIS — D5700 Hb-SS disease with crisis, unspecified: Secondary | ICD-10-CM | POA: Diagnosis present

## 2021-09-14 LAB — CBC WITH DIFFERENTIAL/PLATELET
Abs Immature Granulocytes: 0.09 10*3/uL — ABNORMAL HIGH (ref 0.00–0.07)
Basophils Absolute: 0 10*3/uL (ref 0.0–0.1)
Basophils Relative: 1 %
Eosinophils Absolute: 0.2 10*3/uL (ref 0.0–0.5)
Eosinophils Relative: 3 %
HCT: 29.7 % — ABNORMAL LOW (ref 39.0–52.0)
Hemoglobin: 10.3 g/dL — ABNORMAL LOW (ref 13.0–17.0)
Immature Granulocytes: 1 %
Lymphocytes Relative: 29 %
Lymphs Abs: 2 10*3/uL (ref 0.7–4.0)
MCH: 29.9 pg (ref 26.0–34.0)
MCHC: 34.7 g/dL (ref 30.0–36.0)
MCV: 86.1 fL (ref 80.0–100.0)
Monocytes Absolute: 0.6 10*3/uL (ref 0.1–1.0)
Monocytes Relative: 9 %
Neutro Abs: 4 10*3/uL (ref 1.7–7.7)
Neutrophils Relative %: 57 %
Platelets: 502 10*3/uL — ABNORMAL HIGH (ref 150–400)
RBC: 3.45 MIL/uL — ABNORMAL LOW (ref 4.22–5.81)
RDW: 19.9 % — ABNORMAL HIGH (ref 11.5–15.5)
WBC: 6.9 10*3/uL (ref 4.0–10.5)
nRBC: 2.3 % — ABNORMAL HIGH (ref 0.0–0.2)

## 2021-09-14 LAB — RETICULOCYTES
Immature Retic Fract: 42.6 % — ABNORMAL HIGH (ref 2.3–15.9)
RBC.: 3.4 MIL/uL — ABNORMAL LOW (ref 4.22–5.81)
Retic Count, Absolute: 286.6 10*3/uL — ABNORMAL HIGH (ref 19.0–186.0)
Retic Ct Pct: 8.4 % — ABNORMAL HIGH (ref 0.4–3.1)

## 2021-09-14 LAB — COMPREHENSIVE METABOLIC PANEL
ALT: 14 U/L (ref 0–44)
AST: 26 U/L (ref 15–41)
Albumin: 4.5 g/dL (ref 3.5–5.0)
Alkaline Phosphatase: 75 U/L (ref 38–126)
Anion gap: 8 (ref 5–15)
BUN: 5 mg/dL — ABNORMAL LOW (ref 6–20)
CO2: 27 mmol/L (ref 22–32)
Calcium: 9.4 mg/dL (ref 8.9–10.3)
Chloride: 105 mmol/L (ref 98–111)
Creatinine, Ser: 0.54 mg/dL — ABNORMAL LOW (ref 0.61–1.24)
GFR, Estimated: >60 mL/min (ref >60)
Glucose, Bld: 98 mg/dL (ref 70–99)
Potassium: 3.3 mmol/L — ABNORMAL LOW (ref 3.5–5.1)
Sodium: 140 mmol/L (ref 135–145)
Total Bilirubin: 4 mg/dL — ABNORMAL HIGH (ref 0.3–1.2)
Total Protein: 7.8 g/dL (ref 6.5–8.1)

## 2021-09-14 MED ORDER — ONDANSETRON HCL 4 MG/2ML IJ SOLN
4.0000 mg | Freq: Four times a day (QID) | INTRAMUSCULAR | Status: DC | PRN
  Administered 2021-09-14: 4 mg via INTRAVENOUS
  Filled 2021-09-14: qty 2

## 2021-09-14 MED ORDER — NALOXONE HCL 0.4 MG/ML IJ SOLN: 0.4000 mg | INTRAMUSCULAR | Status: DC | PRN

## 2021-09-14 MED ORDER — ACETAMINOPHEN 500 MG PO TABS
1000.0000 mg | ORAL_TABLET | Freq: Once | ORAL | Status: AC
  Administered 2021-09-14: 1000 mg via ORAL
  Filled 2021-09-14: qty 2

## 2021-09-14 MED ORDER — HYDROMORPHONE 1 MG/ML IV SOLN
INTRAVENOUS | Status: DC
  Administered 2021-09-14: 7 mg via INTRAVENOUS
  Filled 2021-09-14: qty 30

## 2021-09-14 MED ORDER — DIPHENHYDRAMINE HCL 25 MG PO CAPS: 25.0000 mg | ORAL_CAPSULE | ORAL | Status: DC | PRN

## 2021-09-14 MED ORDER — SODIUM CHLORIDE 0.9% FLUSH: 9.0000 mL | INTRAVENOUS | Status: DC | PRN

## 2021-09-14 MED ORDER — SODIUM CHLORIDE 0.45 % IV SOLN: INTRAVENOUS | Status: DC

## 2021-09-14 MED ORDER — KETOROLAC TROMETHAMINE 30 MG/ML IJ SOLN
15.0000 mg | Freq: Once | INTRAMUSCULAR | Status: AC
  Administered 2021-09-14: 15 mg via INTRAVENOUS
  Filled 2021-09-14: qty 1

## 2021-09-14 NOTE — H&P (Signed)
[facility] History and Physical   Date: 09/14/2021  Patient name: Timothy Marks Medical record number: [id number] Date of birth: 06-20-1990 Age: 31 y.o. Gender: male PCP: Tresa Garter, MD  Attending physician: Tresa Garter, MD  Chief Complaint: Sickle cell pain   History of Present Illness: Timothy Marks is a 31 year old male with a medical history significant for sickle cell disease, chronic pain syndrome, opiate dependence and tolerance, oppositional defiance, and history of anemia of chronic disease presents with complaints of low back and lower extremity pain that is consistent with his crisis.  Patient states that pain intensity has been increasing over the past several days and unrelieved by his home medications.  He was seen in the emergency department 2 days prior for this problem.  He did not receive very much relief from his emergency department visit.  He last had Percocet on last night without sustained relief.  He rates his pain as 9/10, constant, and throbbing.  He denies any headache, fever, chills, chest pain, or shortness of breath.  No urinary symptoms, nausea, vomiting, or diarrhea.  Meds: Medications Prior to Admission  Medication Sig Dispense Refill Last Dose   cholecalciferol (VITAMIN D) 25 MCG (1000 UNIT) tablet Take 1,000 Units by mouth daily.   Past Week   folic acid (FOLVITE) 1 MG tablet Take 1 tablet (1 mg total) by mouth daily. 90 tablet 2 09/13/2021   gabapentin (NEURONTIN) 300 MG capsule TAKE 1 CAPSULE(300 MG) BY MOUTH THREE TIMES DAILY 90 capsule 5 09/13/2021   hydroxyurea (HYDREA) 500 MG capsule Take 1 capsule (500 mg total) by mouth 2 (two) times daily. May take with food to minimize GI side effects. 180 capsule 2 09/13/2021   hydrOXYzine (ATARAX) 25 MG tablet Take 25 mg by mouth daily as needed for anxiety or vomiting.   09/13/2021   morphine (MS CONTIN) 15 MG 12 hr tablet Take 1 tablet (15 mg total) by mouth every 12 (twelve)  hours. 60 tablet 0 09/13/2021   OLANZapine (ZYPREXA) 5 MG tablet Take 1 tablet (5 mg total) by mouth at bedtime. 30 tablet 2 09/13/2021   oxyCODONE-acetaminophen (PERCOCET) 10-325 MG tablet Take 1 tablet by mouth every 6 (six) hours as needed for up to 15 days for pain. 60 tablet 0 09/13/2021   rivaroxaban (XARELTO) 20 MG TABS tablet Take 1 tablet (20 mg total) by mouth daily with supper. 30 tablet 3 09/13/2021   mirtazapine (REMERON) 7.5 MG tablet Take 7.5 mg by mouth at bedtime as needed (for sleep).       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   Sickle cell anemia (HCC)    Vitamin D deficiency y-6   Past Surgical History:  Procedure Laterality Date   CHOLECYSTECTOMY     Family History  Problem Relation Age of Onset   Sickle cell trait Mother    Sickle cell trait Father    Social History   Socioeconomic History   Marital status: Single    Spouse name: Not on file   Number of children: Not on file   Years of education: Not on file   Highest education level: Not on file  Occupational History   Not on file  Tobacco Use  Smoking status: Former    Types: Cigarettes   Smokeless tobacco: Never  Vaping Use   Vaping Use: Never used  Substance and Sexual Activity   Alcohol use: No   Drug use: Not Currently    Types: Marijuana   Sexual activity: Yes  Other Topics Concern   Not on file  Social History Narrative   Not on file   Social Determinants of Health   Financial Resource Strain: Not on file  Food Insecurity: No Food Insecurity (06/28/2021)   Hunger Vital Sign    Worried About Running Out of Food in the Last Year: Never true    Ran Out of Food in the Last Year:  Never true  Transportation Needs: No Transportation Needs (06/28/2021)   PRAPARE - Hydrologist (Medical): No    Lack of Transportation (Non-Medical): No  Physical Activity: Not on file  Stress: Not on file  Social Connections: Not on file  Intimate Partner Violence: Not on file   Review of Systems  Constitutional:  Negative for chills and fever.  HENT: Negative.    Eyes: Negative.   Respiratory: Negative.    Cardiovascular: Negative.   Gastrointestinal: Negative.   Genitourinary: Negative.   Musculoskeletal:  Positive for back pain and joint pain.  Skin: Negative.   Neurological: Negative.   Psychiatric/Behavioral: Negative.      Physical Exam: Blood pressure 117/64, pulse 88, temperature 98.4 F (36.9 C), temperature source Temporal, resp. rate 18, SpO2 100 %. Physical Exam Constitutional:      Appearance: Normal appearance.  Eyes:     Pupils: Pupils are equal, round, and reactive to light.  Cardiovascular:     Rate and Rhythm: Normal rate and regular rhythm.     Pulses: Normal pulses.  Pulmonary:     Effort: Pulmonary effort is normal.  Abdominal:     General: Bowel sounds are normal.  Musculoskeletal:        General: Normal range of motion.  Skin:    General: Skin is warm.  Neurological:     General: No focal deficit present.     Mental Status: He is alert. Mental status is at baseline.  Psychiatric:        Mood and Affect: Mood normal.        Behavior: Behavior normal.        Thought Content: Thought content normal.        Judgment: Judgment normal.      Lab results: No results found for this or any previous visit (from the past 24 hour(s)).  Imaging results:  No results found.   Assessment & Plan: Patient admitted to sickle cell day infusion center for management of pain crisis.  Patient is opiate tolerant Initiate IV dilaudid PCA.  IV fluids, 0.45% saline at 100 ml/hr Toradol 15 mg IV times one dose Tylenol 1000 mg  by mouth times one dose Review CBC with differential, complete metabolic panel, and reticulocytes as results become available.  Pain intensity will be reevaluated in context of functioning and relationship to baseline as care progresses If pain intensity remains elevated and/or sudden change in hemodynamic stability transition to inpatient services for higher level of care.      [name]  APRN, MSN, FNP-C [facility] [address] [place], [place] [address] [phone]  09/14/2021, 11:03 AM

## 2021-09-14 NOTE — Telephone Encounter (Signed)
Patient called, requesting to come to the day hospital due to pain in the lower back rated at 10/10. Denied chest pain, fever, diarrhea, abdominal pain, nausea/vomitting. Screened negative for Covid-19 symptoms. Admitted to having means of transportation without driving self after treatment. Last took 1 tablet of  10-325 mg Percocet at 22:00 last night. Per provider, patient can come to the day hospital for treatment. Patient notified, verbalized understanding.

## 2021-09-14 NOTE — Progress Notes (Signed)
Patient admitted to the day hospital for treatment of sickle cell pain crisis. Patient reported pain rated 10/10 in the lower back Patient placed on Dilaudid PCA, given PO Tylenol, IV Toradol, IV Zofran and hydrated with IV fluids. At discharge patient reported  pain at 7/10. Declined printed AVS. Patient alert, oriented and ambulatory at discharge.

## 2021-09-15 ENCOUNTER — Telehealth: Payer: Self-pay | Admitting: Internal Medicine

## 2021-09-15 ENCOUNTER — Other Ambulatory Visit: Payer: Self-pay | Admitting: Internal Medicine

## 2021-09-15 DIAGNOSIS — D571 Sickle-cell disease without crisis: Secondary | ICD-10-CM

## 2021-09-15 DIAGNOSIS — G894 Chronic pain syndrome: Secondary | ICD-10-CM

## 2021-09-15 NOTE — Discharge Summary (Addendum)
[facility] Discharge Summary   Patient ID: Timothy Marks MRN: [id number] DOB/AGE: 10/18/1990 31 y.o.  Admit date: 09/14/2021 Discharge date: 09/14/2021  Primary Care Physician:  Tresa Garter, MD  Admission Diagnoses:  Principal Problem:   Sickle cell pain crisis [facility])   Discharge Medications:  Allergies as of 09/14/2021       Reactions   Buprenorphine Hcl Hives   Levaquin [levofloxacin] Itching   Meperidine Rash   Morphine Hives   Morphine And Related Hives   Tramadol Hives   Vancomycin Itching   Zosyn [piperacillin Sod-tazobactam So] Itching, Rash, Other (See Comments)   Has taken rocephin in the past        Medication List     TAKE these medications    cholecalciferol 25 MCG (1000 UNIT) tablet Commonly known as: VITAMIN D3 Take 1,000 Units by mouth daily.   folic acid 1 MG tablet Commonly known as: FOLVITE Take 1 tablet (1 mg total) by mouth daily.   gabapentin 300 MG capsule Commonly known as: NEURONTIN TAKE 1 CAPSULE(300 MG) BY MOUTH THREE TIMES DAILY   hydroxyurea 500 MG capsule Commonly known as: HYDREA Take 1 capsule (500 mg total) by mouth 2 (two) times daily. May take with food to minimize GI side effects.   hydrOXYzine 25 MG tablet Commonly known as: ATARAX Take 25 mg by mouth daily as needed for anxiety or vomiting.   mirtazapine 7.5 MG tablet Commonly known as: REMERON Take 7.5 mg by mouth at bedtime as needed (for sleep).   morphine 15 MG 12 hr tablet Commonly known as: MS CONTIN Take 1 tablet (15 mg total) by mouth every 12 (twelve) hours.   OLANZapine 5 MG tablet Commonly known as: ZYPREXA Take 1 tablet (5 mg total) by mouth at bedtime.   oxyCODONE-acetaminophen 10-325 MG tablet Commonly known as: PERCOCET Take 1 tablet by mouth every 6 (six) hours as needed for up to 15 days for pain.   rivaroxaban 20 MG Tabs tablet Commonly known as: Xarelto Take 1 tablet (20 mg total) by mouth daily with supper.          Consults:  None  Significant Diagnostic Studies:  No results found.  History of present illness:  Timothy Marks is a 31 year old male with a medical history significant for sickle cell disease, chronic pain syndrome, opiate dependence and tolerance, oppositional defiance, and history of anemia of chronic disease presents with complaints of low back and lower extremity pain that is consistent with his crisis.  Patient states that pain intensity has been increasing over the past several days and unrelieved by his home medications.  He was seen in the emergency department 2 days prior for this problem.  He did not receive very much relief from his emergency department visit.  He last had Percocet on last night without sustained relief.  He rates his pain as 9/10, constant, and throbbing.  He denies any headache, fever, chills, chest pain, or shortness of breath.  No urinary symptoms, nausea, vomiting, or diarrhea.   [facility] Course: Patient admitted to sickle cell day infusion clinic for management of pain crisis. Reviewed all laboratory values, consistent with his baseline. Pain managed with IV Dilaudid PCA Toradol 15 mg IV x1 Tylenol 1000 mg by mouth x1 IV fluids, 0.45% saline at 100 mL/h Patient's pain intensity decreased to 6/10 and he will be discharged home today. He is alert, oriented, and ambulating without assistance. Patient will discharge home in a hemodynamically  stable condition.  Discharge instructions: Resume all home medications.   Follow up with PCP as previously  scheduled.   Discussed the importance of drinking 64 ounces of water daily, dehydration of red blood cells may lead further sickling.   Avoid all stressors that precipitate sickle cell pain crisis.     The patient was given clear instructions to go to ER or return to medical center if symptoms do not improve, worsen or new problems develop.    Physical Exam at Discharge:  BP 108/71  (BP Location: Right Arm)   Pulse 64   Temp 98.4 F (36.9 C) (Temporal)   Resp 10   SpO2 97%   Physical Exam Constitutional:      Appearance: Normal appearance.  Eyes:     Pupils: Pupils are equal, round, and reactive to light.  Cardiovascular:     Rate and Rhythm: Normal rate and regular rhythm.     Pulses: Normal pulses.  Pulmonary:     Effort: Pulmonary effort is normal.  Abdominal:     General: Bowel sounds are normal.  Musculoskeletal:        General: Normal range of motion.  Skin:    General: Skin is warm.  Neurological:     Mental Status: He is alert. Mental status is at baseline.  Psychiatric:        Mood and Affect: Mood normal.        Behavior: Behavior normal.        Thought Content: Thought content normal.        Judgment: Judgment normal.       Disposition at Discharge: Discharge disposition: 01-Home or Self Care       Discharge Orders: Discharge Instructions     Discharge patient   Complete by: As directed    Discharge disposition: 01-Home or Self Care   Discharge patient date: 09/14/2021       Condition at Discharge:   Stable  Time spent on Discharge:  Greater than 30 minutes.  Signed: [name]  APRN, MSN, FNP-C [facility] [address] [name], [place] [address] [phone]  09/15/2021, 3:46 PM

## 2021-09-15 NOTE — Telephone Encounter (Signed)
Percocet refill for Friday request

## 2021-09-16 ENCOUNTER — Telehealth: Payer: Self-pay

## 2021-09-16 NOTE — Telephone Encounter (Signed)
Percocet

## 2021-09-20 ENCOUNTER — Other Ambulatory Visit: Payer: Self-pay | Admitting: Internal Medicine

## 2021-09-20 ENCOUNTER — Other Ambulatory Visit (HOSPITAL_COMMUNITY): Payer: Self-pay

## 2021-09-20 DIAGNOSIS — D571 Sickle-cell disease without crisis: Secondary | ICD-10-CM

## 2021-09-20 DIAGNOSIS — G894 Chronic pain syndrome: Secondary | ICD-10-CM

## 2021-09-20 MED ORDER — OXYCODONE-ACETAMINOPHEN 10-325 MG PO TABS
1.0000 | ORAL_TABLET | Freq: Four times a day (QID) | ORAL | 0 refills | Status: DC | PRN
  Filled 2021-09-20 (×2): qty 60, 15d supply, fill #0

## 2021-09-20 MED ORDER — OXYCODONE-ACETAMINOPHEN 10-325 MG PO TABS
1.0000 | ORAL_TABLET | Freq: Four times a day (QID) | ORAL | 0 refills | Status: DC | PRN
Start: 2021-09-20 — End: 2021-09-20

## 2021-09-29 ENCOUNTER — Encounter (HOSPITAL_BASED_OUTPATIENT_CLINIC_OR_DEPARTMENT_OTHER): Payer: Self-pay

## 2021-09-29 ENCOUNTER — Emergency Department (HOSPITAL_BASED_OUTPATIENT_CLINIC_OR_DEPARTMENT_OTHER): Payer: 59 | Admitting: Radiology

## 2021-09-29 ENCOUNTER — Other Ambulatory Visit: Payer: Self-pay

## 2021-09-29 ENCOUNTER — Emergency Department (HOSPITAL_BASED_OUTPATIENT_CLINIC_OR_DEPARTMENT_OTHER)
Admission: EM | Admit: 2021-09-29 | Discharge: 2021-09-29 | Disposition: A | Discharge status: Home / Self Care | Payer: 59 | Attending: Emergency Medicine | Admitting: Emergency Medicine

## 2021-09-29 DIAGNOSIS — D75839 Thrombocytosis, unspecified: Secondary | ICD-10-CM | POA: Diagnosis not present

## 2021-09-29 DIAGNOSIS — D649 Anemia, unspecified: Secondary | ICD-10-CM | POA: Insufficient documentation

## 2021-09-29 DIAGNOSIS — E876 Hypokalemia: Secondary | ICD-10-CM | POA: Insufficient documentation

## 2021-09-29 DIAGNOSIS — D5700 Hb-SS disease with crisis, unspecified: Secondary | ICD-10-CM | POA: Insufficient documentation

## 2021-09-29 LAB — CBC WITH DIFFERENTIAL/PLATELET
Abs Immature Granulocytes: 0.03 10*3/uL (ref 0.00–0.07)
Basophils Absolute: 0.1 10*3/uL (ref 0.0–0.1)
Basophils Relative: 1 %
Eosinophils Absolute: 0.2 10*3/uL (ref 0.0–0.5)
Eosinophils Relative: 2 %
HCT: 31.5 % — ABNORMAL LOW (ref 39.0–52.0)
Hemoglobin: 10.9 g/dL — ABNORMAL LOW (ref 13.0–17.0)
Immature Granulocytes: 0 %
Lymphocytes Relative: 44 %
Lymphs Abs: 4.1 10*3/uL — ABNORMAL HIGH (ref 0.7–4.0)
MCH: 29.6 pg (ref 26.0–34.0)
MCHC: 34.6 g/dL (ref 30.0–36.0)
MCV: 85.6 fL (ref 80.0–100.0)
Monocytes Absolute: 0.8 10*3/uL (ref 0.1–1.0)
Monocytes Relative: 9 %
Neutro Abs: 4.2 10*3/uL (ref 1.7–7.7)
Neutrophils Relative %: 44 %
Platelets: 488 10*3/uL — ABNORMAL HIGH (ref 150–400)
RBC: 3.68 MIL/uL — ABNORMAL LOW (ref 4.22–5.81)
RDW: 17.5 % — ABNORMAL HIGH (ref 11.5–15.5)
WBC: 9.3 10*3/uL (ref 4.0–10.5)
nRBC: 1.2 % — ABNORMAL HIGH (ref 0.0–0.2)

## 2021-09-29 LAB — COMPREHENSIVE METABOLIC PANEL
ALT: 6 U/L (ref 0–44)
AST: 19 U/L (ref 15–41)
Albumin: 4.7 g/dL (ref 3.5–5.0)
Alkaline Phosphatase: 70 U/L (ref 38–126)
Anion gap: 10 (ref 5–15)
BUN: 6 mg/dL (ref 6–20)
CO2: 24 mmol/L (ref 22–32)
Calcium: 9.5 mg/dL (ref 8.9–10.3)
Chloride: 101 mmol/L (ref 98–111)
Creatinine, Ser: 0.63 mg/dL (ref 0.61–1.24)
GFR, Estimated: >60 mL/min (ref >60)
Glucose, Bld: 84 mg/dL (ref 70–99)
Potassium: 3.2 mmol/L — ABNORMAL LOW (ref 3.5–5.1)
Sodium: 135 mmol/L (ref 135–145)
Total Bilirubin: 3.2 mg/dL — ABNORMAL HIGH (ref 0.3–1.2)
Total Protein: 7.7 g/dL (ref 6.5–8.1)

## 2021-09-29 LAB — RETICULOCYTES
Immature Retic Fract: 30.5 % — ABNORMAL HIGH (ref 2.3–15.9)
RBC.: 3.6 MIL/uL — ABNORMAL LOW (ref 4.22–5.81)
Retic Count, Absolute: 279.4 10*3/uL — ABNORMAL HIGH (ref 19.0–186.0)
Retic Ct Pct: 7.8 % — ABNORMAL HIGH (ref 0.4–3.1)

## 2021-09-29 MED ORDER — KETAMINE HCL 50 MG/ML IJ SOLN
1.5000 mg/kg | Freq: Once | INTRAMUSCULAR | Status: AC
  Administered 2021-09-29: 90 mg via INTRAMUSCULAR
  Filled 2021-09-29: qty 10

## 2021-09-29 MED ORDER — OXYCODONE HCL 5 MG PO TABS
10.0000 mg | ORAL_TABLET | Freq: Once | ORAL | Status: AC
  Administered 2021-09-29: 10 mg via ORAL
  Filled 2021-09-29: qty 2

## 2021-09-29 MED ORDER — HYDROMORPHONE HCL 1 MG/ML IJ SOLN
1.0000 mg | INTRAMUSCULAR | Status: AC
  Administered 2021-09-29: 1 mg via INTRAMUSCULAR
  Filled 2021-09-29: qty 1

## 2021-09-29 NOTE — ED Provider Notes (Signed)
[facility] EMERGENCY DEPT Provider Note   CSN: [id number] Arrival date & time: 09/29/21  1338     History Chief Complaint  Patient presents with   Sickle Cell Pain Crisis    Timothy Marks is a 31 y.o. male with history of sickle cell presents the emergency department for evaluation of possible sickle cell crisis.  Patient reports he is having his typical sickle cell pain in his lower back.  Denies any urinary or fecal incontinence.  Denies any saddle anesthesia.  Denies any urinary retention or fevers.  He reports he has tried his at home medications without much relief.  Denies any chest pain or shortness of breath.  Denies any cough, abdominal pain, nausea, vomiting.   Sickle Cell Pain Crisis Associated symptoms: no chest pain, no fever and no shortness of breath        Home Medications Prior to Admission medications   Medication Sig Start Date End Date Taking? Authorizing Provider  cholecalciferol (VITAMIN D) 25 MCG (1000 UNIT) tablet Take 1,000 Units by mouth daily.    [provider]  folic acid (FOLVITE) 1 MG tablet Take 1 tablet (1 mg total) by mouth daily. 07/05/21   Dorena Dew, FNP  gabapentin (NEURONTIN) 300 MG capsule TAKE 1 CAPSULE(300 MG) BY MOUTH THREE TIMES DAILY 08/24/21   Tresa Garter, MD  hydroxyurea (HYDREA) 500 MG capsule Take 1 capsule (500 mg total) by mouth 2 (two) times daily. May take with food to minimize GI side effects. 05/07/20   Tresa Garter, MD  hydrOXYzine (ATARAX) 25 MG tablet Take 25 mg by mouth daily as needed for anxiety or vomiting. 05/19/21   [provider]  mirtazapine (REMERON) 7.5 MG tablet Take 7.5 mg by mouth at bedtime as needed (for sleep). 05/19/21   [provider]  morphine (MS CONTIN) 15 MG 12 hr tablet Take 1 tablet (15 mg total) by mouth every 12 (twelve) hours. 09/13/21 10/13/21  Tresa Garter, MD  OLANZapine (ZYPREXA) 5 MG tablet Take 1 tablet (5 mg total) by mouth at  bedtime. 05/07/21   Dorena Dew, FNP  oxyCODONE-acetaminophen (PERCOCET) 10-325 MG tablet Take 1 tablet by mouth every 6 (six) hours as needed for up to 15 days for pain. 09/20/21 10/05/21  Tresa Garter, MD  rivaroxaban (XARELTO) 20 MG TABS tablet Take 1 tablet (20 mg total) by mouth daily with supper. 07/05/21   Dorena Dew, FNP      Allergies    Buprenorphine hcl, Levaquin [levofloxacin], Meperidine, Morphine, Morphine and related, Tramadol, Vancomycin, and Zosyn [piperacillin sod-tazobactam so]    Review of Systems   Review of Systems  Constitutional:  Negative for chills and fever.  Respiratory:  Negative for shortness of breath.   Cardiovascular:  Negative for chest pain.  Genitourinary:  Negative for decreased urine volume, difficulty urinating, dysuria, frequency, hematuria and urgency.  Musculoskeletal:  Positive for back pain. Negative for neck pain.  Neurological:  Negative for weakness and numbness.    Physical Exam Updated Vital Signs BP 128/73   Pulse 62   Temp 98.4 F (36.9 C)   Resp 12   Ht '6\' 2"'$  (1.88 m)   Wt 61.2 kg   SpO2 95%   BMI 17.32 kg/m  Physical Exam Vitals and nursing note reviewed.  Constitutional:      General: He is not in acute distress.    Appearance: Normal appearance. He is not ill-appearing or toxic-appearing.     Comments:  On phone  HENT:     Head: Normocephalic and atraumatic.  Eyes:     General: No scleral icterus. Cardiovascular:     Rate and Rhythm: Normal rate and regular rhythm.  Pulmonary:     Effort: Pulmonary effort is normal. No respiratory distress.     Breath sounds: Normal breath sounds.  Abdominal:     General: Abdomen is flat. Bowel sounds are normal.     Palpations: Abdomen is soft.     Tenderness: There is no abdominal tenderness. There is no guarding or rebound.  Musculoskeletal:        General: No deformity.     Cervical back: Normal range of motion.     Comments: Diffuse lower back pain, no focal  pain. No step offs or deformities. No increase in erythema or warmth. Strength in lower extremities intact. Compartments soft. Patient is ambulatory with normal gait.   Skin:    General: Skin is warm and dry.  Neurological:     General: No focal deficit present.     Mental Status: He is alert. Mental status is at baseline.     ED Results / Procedures / Treatments   Labs (all labs ordered are listed, but only abnormal results are displayed) Labs Reviewed  COMPREHENSIVE METABOLIC PANEL - Abnormal; Notable for the following components:      Result Value   Potassium 3.2 (*)    Total Bilirubin 3.2 (*)    All other components within normal limits  CBC WITH DIFFERENTIAL/PLATELET - Abnormal; Notable for the following components:   RBC 3.68 (*)    Hemoglobin 10.9 (*)    HCT 31.5 (*)    RDW 17.5 (*)    Platelets 488 (*)    nRBC 1.2 (*)    Lymphs Abs 4.1 (*)    All other components within normal limits  RETICULOCYTES - Abnormal; Notable for the following components:   Retic Ct Pct 7.8 (*)    RBC. 3.60 (*)    Retic Count, Absolute 279.4 (*)    Immature Retic Fract 30.5 (*)    All other components within normal limits    EKG None  Radiology DG Lumbar Spine Complete  Result Date: 09/29/2021 CLINICAL DATA:  Back pain in the setting of sickle cell EXAM: LUMBAR SPINE - COMPLETE 4+ VIEW COMPARISON:  Radiographs 07/28/2018 FINDINGS: No acute fracture or listhesis. Diffuse bony changes of sickle cell again noted in the lumbar spine. Disc spaces are maintained. Cholecystectomy clips. IMPRESSION: No acute osseous abnormality. Diffuse osseous changes of sickle cell disease. Electronically Signed   By: Placido Sou M.D.   On: 09/29/2021 17:32    Procedures Procedures   Medications Ordered in ED Medications  HYDROmorphone (DILAUDID) injection 1 mg (1 mg Intramuscular Given 09/29/21 1737)  HYDROmorphone (DILAUDID) injection 1 mg (1 mg Intramuscular Given 09/29/21 1842)  oxyCODONE (Oxy  IR/ROXICODONE) immediate release tablet 10 mg (10 mg Oral Given 09/29/21 1806)  ketamine (KETALAR) injection 90 mg (90 mg Intramuscular Given 09/29/21 2024)    ED Course/ Medical Decision Making/ A&P                           Medical Decision Making Amount and/or Complexity of Data Reviewed Labs: ordered. Radiology: ordered.  Risk Prescription drug management.   31 year old male presents the emergency department for evaluation of his typical sickle cell pain in his lower back.  Differential diagnosis includes but is not limited to reticulocyte  crisis, unmanaged pain, infection, cauda equina.  Vital signs are unremarkable.  Physical exam as listed above.  Labs and x-ray imaging ordered.  I independently reviewed and interpreted the patient's labs.  CMP shows mild decrease in potassium at 3.2 otherwise no electrolyte abnormality.  Patient has chronically elevated total bilirubin.  CBC shows anemia but is around patient's baseline.  No leukocytosis.  Chronic thrombocytosis present.  Reticulocytes count up.  No reticulocyte crisis.  This is likely patient's pain, possible acute on chronic.  X-ray does not show any acute changes.  Typical sickle cell findings present.  Patient was ordered 1 mg of Dilaudid IM x2 and given 10 mg of oxycodone.  On reevaluation, patient is in no acute distress, vitals are stable, on his cell phone.  He asked why he could not get IV medication.  I discussed with him that from looking at his previous charts he is got an IM medication.  He reports that he would like IV medication like "his cousin gets".  I discussed with him that he is not displaying any signs of acute distress and I have already given him 3 doses of pain medication.  Patient became agitated and was requesting ketamine that he received last time.  From looking at the patient's previous chart, he received 90 of ketamine 1 time in previous.  My attending ordered the ketamine IM.  Patient is not driving  home.  He is having a ride come and pick him up.  I doubt any abscess patient denies any IV drug use.  No fevers.  No leukocytosis seen.  Symptoms not consistent with cauda equina.  This is likely the patient's typical back pain.  Strict return precautions and red flag symptoms given.  Patient is stable being discharged home in good condition.  I discussed this case with my attending physician who cosigned this note including patient's presenting symptoms, physical exam, and planned diagnostics and interventions. Attending physician stated agreement with plan or made changes to plan which were implemented.   Final Clinical Impression(s) / ED Diagnoses Final diagnoses:  Sickle cell anemia with pain [facility])    Rx / DC Orders ED Discharge Orders     None         Sherrell Puller, PA-C 10/04/21 0020    Drenda Freeze, MD 11/04/21 1451

## 2021-09-29 NOTE — ED Notes (Signed)
Messaged PA about ordering pt. Some IV fluids.

## 2021-09-29 NOTE — ED Notes (Signed)
Discharge paperwork given and verbally understood. Pt informed due to the meds that were given, he is not allowed to drive/drink/ operate heavy equipment for the rest of the night. Pt and his cousin have a family/friend outside to drive them home.

## 2021-09-29 NOTE — Discharge Instructions (Signed)
You were seen here today for evaluation of your sickle cell pain.  I am glad we were able to get your pain under control.  I would like for you to follow-up with your sickle cell provider.  Please continue to drink water and stay well-hydrated.  Please make sure you adhere to daily medications.  If you have any concerns, new or worsening symptoms, please return to the nearest emergency department for reevaluation.  Contact a health care provider if: Your symptoms get worse. You have new symptoms. You have a fever. Get help right away if: You have a painful erection of the penis that lasts a long time (priapism). You become short of breath or are having trouble breathing. You have pain that cannot be controlled with medicine. You have any signs of a stroke. "BE FAST" is an easy way to remember the main warning signs: B - Balance. Dizziness, sudden trouble walking, or loss of balance. E - Eyes. Trouble seeing or a change in how you see. F - Face. Sudden weakness or loss of feeling of the face. The face or eyelid may droop on one side. A - Arms. Weakness or loss of feeling in an arm. This happens all of a sudden and most often on one side of the body. S - Speech. Sudden trouble speaking, slurred speech, or trouble understanding what people say. T - Time. Time to call emergency services. Write down what time symptoms started. You have other signs of a stroke, such as: A sudden, very bad headache with no known cause. Feeling like you may vomit (nausea). Vomiting. Seizure. These symptoms may be an emergency. Get help right away. Call 911. Do not wait to see if the symptoms will go away. Do not drive yourself to the hospital. Also, get help right away if: You have strong feelings of sadness or loss of hope, or you have thoughts about hurting yourself or others. Take one of these steps if you feel like you may hurt yourself or others, or have thoughts about taking your own life: Go to your  nearest emergency room. Call 911. Call the [facility] at [phone] or [phone]. This is open 24 hours a day. Text the Crisis Text Line at [phone].

## 2021-09-29 NOTE — ED Triage Notes (Signed)
Patient here POV from Home.  Endorses SCC Related pain since 0700 this AM without Relief from Home Medications. Localized to Lower Back. No Dysuria.   NAD Noted During Triage. A&Ox4. GCS 15. Ambulatory.

## 2021-10-01 ENCOUNTER — Telehealth: Payer: Self-pay

## 2021-10-01 ENCOUNTER — Other Ambulatory Visit: Payer: Self-pay | Admitting: Internal Medicine

## 2021-10-01 NOTE — Telephone Encounter (Signed)
Percocet Morphine

## 2021-10-04 NOTE — Telephone Encounter (Signed)
Pt requesting refill be sent to [facility] long outpatient pharmacy

## 2021-10-05 ENCOUNTER — Other Ambulatory Visit: Payer: Self-pay | Admitting: Internal Medicine

## 2021-10-05 ENCOUNTER — Other Ambulatory Visit (HOSPITAL_COMMUNITY): Payer: Self-pay

## 2021-10-05 DIAGNOSIS — G894 Chronic pain syndrome: Secondary | ICD-10-CM

## 2021-10-05 DIAGNOSIS — D571 Sickle-cell disease without crisis: Secondary | ICD-10-CM

## 2021-10-05 MED ORDER — OXYCODONE-ACETAMINOPHEN 10-325 MG PO TABS: 1.0000 | ORAL_TABLET | Freq: Four times a day (QID) | ORAL | 0 refills | Status: DC | PRN

## 2021-10-08 ENCOUNTER — Other Ambulatory Visit: Payer: Self-pay | Admitting: Internal Medicine

## 2021-10-08 ENCOUNTER — Telehealth: Payer: Self-pay

## 2021-10-08 NOTE — Telephone Encounter (Signed)
Morphine

## 2021-10-13 ENCOUNTER — Other Ambulatory Visit (HOSPITAL_COMMUNITY): Payer: Self-pay

## 2021-10-13 ENCOUNTER — Telehealth: Payer: Self-pay | Admitting: Internal Medicine

## 2021-10-13 NOTE — Telephone Encounter (Signed)
Morphine refill request.

## 2021-10-14 ENCOUNTER — Other Ambulatory Visit: Payer: Self-pay | Admitting: Internal Medicine

## 2021-10-14 ENCOUNTER — Other Ambulatory Visit (HOSPITAL_COMMUNITY): Payer: Self-pay

## 2021-10-14 ENCOUNTER — Ambulatory Visit: Payer: 59 | Admitting: Internal Medicine

## 2021-10-14 ENCOUNTER — Encounter: Payer: Self-pay | Admitting: Internal Medicine

## 2021-10-14 VITALS — BP 118/76 | HR 80 | Temp 98.2°F | Resp 16 | Ht 74.0 in | Wt 116.0 lb

## 2021-10-14 DIAGNOSIS — D571 Sickle-cell disease without crisis: Secondary | ICD-10-CM

## 2021-10-14 DIAGNOSIS — L309 Dermatitis, unspecified: Secondary | ICD-10-CM | POA: Diagnosis not present

## 2021-10-14 DIAGNOSIS — Z681 Body mass index (BMI) 19 or less, adult: Secondary | ICD-10-CM

## 2021-10-14 DIAGNOSIS — R636 Underweight: Secondary | ICD-10-CM

## 2021-10-14 DIAGNOSIS — G894 Chronic pain syndrome: Secondary | ICD-10-CM | POA: Diagnosis not present

## 2021-10-14 DIAGNOSIS — I2724 Chronic thromboembolic pulmonary hypertension: Secondary | ICD-10-CM | POA: Insufficient documentation

## 2021-10-14 MED ORDER — HYDROCORTISONE 1 % EX OINT: 1.0000 | TOPICAL_OINTMENT | Freq: Two times a day (BID) | CUTANEOUS | 0 refills | Status: DC

## 2021-10-14 MED ORDER — MORPHINE SULFATE ER 15 MG PO TBCR: 15.0000 mg | EXTENDED_RELEASE_TABLET | Freq: Two times a day (BID) | ORAL | 0 refills | Status: AC

## 2021-10-14 NOTE — Progress Notes (Signed)
 Timothy Marks, is a 31 y.o. male  RDW:[id number]  FMW:[id number]  DOB - 1990/03/14  Chief Complaint  Patient presents with   Rash   Medication Refill   Weight Loss       Subjective:   Timothy Marks is a 31 y.o. male with history of sickle cell disease, major depression and anxiety, anemia of chronic disease, and chronic pain syndrome who presented here today for a routine follow up visit.  He has no new complaints today.  He is requesting medication refills.  He also think he has been losing weight, he used to weigh 134 pounds but now weighs 116 pounds.  He also complained about a rash on his back but is not new.  He continues to have pain related to his sickle cell disease, he is rating his pain at 8/10 this morning, mostly in his left arm characterized as constant and throbbing.  He ran out of his home pain medications and will need a refill today.  Patient has No headache, No chest pain, No abdominal pain - No Nausea, No new weakness tingling or numbness, No Cough - SOB.  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 Authorizing Provider  cholecalciferol  (VITAMIN D ) 25 MCG (1000 UNIT) tablet Take 1,000 Units by mouth daily.   Yes [provider]  folic acid   (FOLVITE ) 1 MG tablet Take 1 tablet (1 mg total) by mouth daily. 07/05/21  Yes Tilford Bertram HERO, FNP  gabapentin  (NEURONTIN ) 300 MG capsule TAKE 1 CAPSULE(300 MG) BY MOUTH THREE TIMES DAILY 08/24/21  Yes Marchello Rothgeb E, MD  hydrocortisone  1 % ointment Apply 1 Application topically 2 (two) times daily. 10/14/21  Yes Sophronia Varney E, MD  hydroxyurea  (HYDREA ) 500 MG capsule Take 1 capsule (500 mg total) by mouth 2 (two) times daily. May take with food to minimize GI side effects. 05/07/20  Yes Alika Eppes E, MD  hydrOXYzine  (ATARAX ) 25 MG tablet Take 25 mg by mouth daily as needed for anxiety or vomiting. 05/19/21  Yes [provider]  mirtazapine  (REMERON ) 7.5 MG tablet Take 7.5 mg by mouth at bedtime as needed (for sleep). 05/19/21  Yes [provider]  OLANZapine  (ZYPREXA ) 5 MG tablet Take 1 tablet (5 mg total) by mouth at bedtime. 05/07/21  Yes Tilford Bertram HERO, FNP  oxyCODONE -acetaminophen  (PERCOCET) 10-325 MG tablet Take 1 tablet by mouth every 6 (six) hours as needed for up to 15 days for pain. 10/05/21 10/20/21 Yes Glenda Spelman E, MD  rivaroxaban  (XARELTO ) 20 MG TABS tablet Take 1 tablet (20 mg total) by mouth daily with supper. 07/05/21  Yes Tilford Bertram HERO, FNP    Objective:   Vitals:   10/14/21 0932  BP: 118/76  Pulse: 80  Resp: 16  Temp: 98.2 F (36.8 C)  TempSrc: Oral  SpO2: 100%  Weight: 116 lb (52.6 kg)  Height: 6' 2 (1.88 m)   Exam General appearance : Awake, alert, not in any distress. Speech Clear. Not toxic looking HEENT: Atraumatic and Normocephalic, pupils equally reactive to light and accomodation Neck: Supple, no JVD. No cervical lymphadenopathy.  Chest: Good air entry bilaterally, no added sounds  CVS: S1 S2 regular, no murmurs.  Abdomen: Bowel sounds present, Non tender and not distended with no gaurding, rigidity or rebound. Extremities: B/L Lower Ext shows no edema, both legs are warm to touch Neurology: Awake alert, and  oriented X 3, CN II-XII intact, Non focal Skin: No Rash  Data Review No results found for: HGBA1C  Assessment & Plan   1. Sickle cell disease without crisis (HCC)  Sickle cell disease - We discussed the need for good hydration, monitoring of hydration status, avoidance of heat, cold, stress, and infection triggers. The patient was reminded of the need to seek medical attention for any symptoms of bleeding, anemia, or infection. Continue folic acid  1 mg daily.  Pulmonary evaluation - Patient denies severe recurrent wheezes, shortness of breath with exercise, or persistent cough. If these symptoms develop, pulmonary function tests with spirometry will be ordered, and if abnormal, plan on referral to Pulmonology for further evaluation.  Eye - High risk of proliferative retinopathy. Annual eye exam with retinal exam recommended to patient, the patient has had eye exam this year.  Immunization status - Yearly influenza vaccination is recommended, as well as being up to date with Meningococcal and Pneumococcal vaccines.   Patient voiced understanding and agreeable to plan of care. No barriers to understanding were noted.  2. Chronic pain syndrome We agreed on Opiate dose and amount of pills  per month. We discussed that pt is to receive Schedule II prescriptions only from our clinic. Pt is also aware that the prescription history is available to us  online through the [facility] CSRS. Controlled substance agreement reviewed and signed. We reminded Abdinasir that all patients receiving Schedule II narcotics must be seen for follow within one month of prescription being requested. We reviewed the terms of our pain agreement, including the need to keep medicines in a safe locked location away from children or pets, and the need to report excess sedation or constipation, measures to avoid constipation, and policies related to early refills and stolen prescriptions. According to the [place] [facility]  program, we have reviewed details related to analgesia, adverse effects and aberrant behaviors.  3. Eczema, unspecified type Continue current medication including the cream.  4. Underweight  - Amb ref to Medical Nutrition Therapy-MNT  Patient have been counseled extensively about nutrition and exercise. Other issues discussed during this visit include: annual eye examinations at Ophthalmology, importance of adherence with medications and regular follow-up.   Return in about 2 months (around 12/14/2021) for Routine Follow Up, Sickle Cell Disease/Pain.  The patient was given clear instructions to go to ER or return to medical center if symptoms don't improve, worsen or new problems develop. The patient verbalized understanding. The patient was told to call to get lab results if they haven't heard anything in the next week.   This note has been created with Education officer, environmental. Any transcriptional errors are unintentional.    Precious Schatz, MD, MHA, GENI BEL, CPE [facility] and [facility], [place] [phone]   10/14/2021, 10:11 AM

## 2021-10-14 NOTE — Progress Notes (Signed)
F/u - Weight loss- 134bs to 116lbs in 2 weeks Rash on back Medication refill   Pain 8/10 chest and left arm Constant pain at those sites

## 2021-10-19 ENCOUNTER — Other Ambulatory Visit: Payer: Self-pay | Admitting: Internal Medicine

## 2021-10-19 ENCOUNTER — Telehealth: Payer: Self-pay

## 2021-10-19 ENCOUNTER — Telehealth: Payer: Self-pay | Admitting: Internal Medicine

## 2021-10-19 DIAGNOSIS — D571 Sickle-cell disease without crisis: Secondary | ICD-10-CM

## 2021-10-19 DIAGNOSIS — G894 Chronic pain syndrome: Secondary | ICD-10-CM

## 2021-10-19 MED ORDER — OXYCODONE-ACETAMINOPHEN 10-325 MG PO TABS: 1.0000 | ORAL_TABLET | Freq: Four times a day (QID) | ORAL | 0 refills | Status: DC | PRN

## 2021-10-19 NOTE — Telephone Encounter (Signed)
Requesting percocet refill request

## 2021-10-19 NOTE — Telephone Encounter (Signed)
Percocet

## 2021-11-01 ENCOUNTER — Telehealth: Payer: Self-pay

## 2021-11-01 NOTE — Telephone Encounter (Signed)
Percocet

## 2021-11-02 ENCOUNTER — Other Ambulatory Visit: Payer: Self-pay | Admitting: Internal Medicine

## 2021-11-03 ENCOUNTER — Other Ambulatory Visit: Payer: Self-pay | Admitting: Family Medicine

## 2021-11-03 ENCOUNTER — Telehealth: Payer: Self-pay

## 2021-11-03 NOTE — Telephone Encounter (Signed)
Percocet    I asked Dr Lenna Sciara but no response   Thank you

## 2021-11-04 ENCOUNTER — Other Ambulatory Visit: Payer: Self-pay | Admitting: Internal Medicine

## 2021-11-04 ENCOUNTER — Other Ambulatory Visit: Payer: Self-pay | Admitting: Family Medicine

## 2021-11-04 DIAGNOSIS — D571 Sickle-cell disease without crisis: Secondary | ICD-10-CM

## 2021-11-04 DIAGNOSIS — G894 Chronic pain syndrome: Secondary | ICD-10-CM

## 2021-11-04 MED ORDER — OXYCODONE-ACETAMINOPHEN 10-325 MG PO TABS: 1.0000 | ORAL_TABLET | Freq: Four times a day (QID) | ORAL | 0 refills | Status: DC | PRN

## 2021-11-04 NOTE — Progress Notes (Signed)
Reviewed PDMP substance reporting system prior to prescribing opiate medications. No inconsistencies noted.  Meds ordered this encounter  Medications   oxyCODONE-acetaminophen (PERCOCET) 10-325 MG tablet    Sig: Take 1 tablet by mouth every 6 (six) hours as needed for up to 15 days for pain.    Dispense:  60 tablet    Refill:  0    Order Specific Question:   Supervising Provider    Answer:   Tresa Garter [id number]   [name]  APRN, MSN, FNP-C [facility] [address] [place], [place] [address] [phone]

## 2021-11-09 ENCOUNTER — Telehealth: Payer: Self-pay

## 2021-11-09 NOTE — Telephone Encounter (Signed)
Morphine

## 2021-11-15 ENCOUNTER — Encounter (HOSPITAL_BASED_OUTPATIENT_CLINIC_OR_DEPARTMENT_OTHER): Payer: Self-pay

## 2021-11-15 ENCOUNTER — Other Ambulatory Visit: Payer: Self-pay | Admitting: Family Medicine

## 2021-11-15 ENCOUNTER — Emergency Department (HOSPITAL_BASED_OUTPATIENT_CLINIC_OR_DEPARTMENT_OTHER)
Admission: EM | Admit: 2021-11-15 | Discharge: 2021-11-16 | Disposition: A | Discharge status: Home / Self Care | Payer: 59 | Attending: Emergency Medicine | Admitting: Emergency Medicine

## 2021-11-15 ENCOUNTER — Telehealth (HOSPITAL_COMMUNITY): Payer: Self-pay

## 2021-11-15 DIAGNOSIS — D5700 Hb-SS disease with crisis, unspecified: Secondary | ICD-10-CM | POA: Insufficient documentation

## 2021-11-15 DIAGNOSIS — D571 Sickle-cell disease without crisis: Secondary | ICD-10-CM

## 2021-11-15 DIAGNOSIS — G894 Chronic pain syndrome: Secondary | ICD-10-CM

## 2021-11-15 DIAGNOSIS — Z7901 Long term (current) use of anticoagulants: Secondary | ICD-10-CM | POA: Insufficient documentation

## 2021-11-15 LAB — CBC WITH DIFFERENTIAL/PLATELET
Abs Immature Granulocytes: 0.06 10*3/uL (ref 0.00–0.07)
Basophils Absolute: 0.1 10*3/uL (ref 0.0–0.1)
Basophils Relative: 1 %
Eosinophils Absolute: 0.5 10*3/uL (ref 0.0–0.5)
Eosinophils Relative: 4 %
HCT: 26 % — ABNORMAL LOW (ref 39.0–52.0)
Hemoglobin: 9.3 g/dL — ABNORMAL LOW (ref 13.0–17.0)
Immature Granulocytes: 1 %
Lymphocytes Relative: 37 %
Lymphs Abs: 4.7 10*3/uL — ABNORMAL HIGH (ref 0.7–4.0)
MCH: 30.6 pg (ref 26.0–34.0)
MCHC: 35.8 g/dL (ref 30.0–36.0)
MCV: 85.5 fL (ref 80.0–100.0)
Monocytes Absolute: 1 10*3/uL (ref 0.1–1.0)
Monocytes Relative: 8 %
Neutro Abs: 6.4 10*3/uL (ref 1.7–7.7)
Neutrophils Relative %: 49 %
Platelets: 464 10*3/uL — ABNORMAL HIGH (ref 150–400)
RBC: 3.04 MIL/uL — ABNORMAL LOW (ref 4.22–5.81)
RDW: 18.2 % — ABNORMAL HIGH (ref 11.5–15.5)
WBC: 12.8 10*3/uL — ABNORMAL HIGH (ref 4.0–10.5)
nRBC: 1.1 % — ABNORMAL HIGH (ref 0.0–0.2)

## 2021-11-15 LAB — BASIC METABOLIC PANEL
Anion gap: 8 (ref 5–15)
BUN: 6 mg/dL (ref 6–20)
CO2: 27 mmol/L (ref 22–32)
Calcium: 9.4 mg/dL (ref 8.9–10.3)
Chloride: 103 mmol/L (ref 98–111)
Creatinine, Ser: 0.59 mg/dL — ABNORMAL LOW (ref 0.61–1.24)
GFR, Estimated: >60 mL/min (ref >60)
Glucose, Bld: 91 mg/dL (ref 70–99)
Potassium: 3.6 mmol/L (ref 3.5–5.1)
Sodium: 138 mmol/L (ref 135–145)

## 2021-11-15 LAB — RETICULOCYTES
Immature Retic Fract: 34.1 % — ABNORMAL HIGH (ref 2.3–15.9)
RBC.: 3.05 MIL/uL — ABNORMAL LOW (ref 4.22–5.81)
Retic Count, Absolute: 338.6 10*3/uL — ABNORMAL HIGH (ref 19.0–186.0)
Retic Ct Pct: 11.1 % — ABNORMAL HIGH (ref 0.4–3.1)

## 2021-11-15 MED ORDER — HYDROMORPHONE HCL 1 MG/ML IJ SOLN
2.0000 mg | INTRAMUSCULAR | Status: AC
  Administered 2021-11-15: 2 mg via INTRAVENOUS
  Filled 2021-11-15: qty 2

## 2021-11-15 MED ORDER — MORPHINE SULFATE ER 15 MG PO TBCR: 15.0000 mg | EXTENDED_RELEASE_TABLET | Freq: Two times a day (BID) | ORAL | 0 refills | Status: DC

## 2021-11-15 MED ORDER — KETAMINE HCL 10 MG/ML IJ SOLN
0.3000 mg/kg | Freq: Once | INTRAMUSCULAR | Status: AC
  Administered 2021-11-15: 17 mg via INTRAVENOUS
  Filled 2021-11-15: qty 1

## 2021-11-15 MED ORDER — HYDROMORPHONE HCL 1 MG/ML IJ SOLN: 2.0000 mg | INTRAMUSCULAR | Status: DC

## 2021-11-15 MED ORDER — DIPHENHYDRAMINE HCL 25 MG PO CAPS
25.0000 mg | ORAL_CAPSULE | ORAL | Status: DC | PRN
  Administered 2021-11-15: 25 mg via ORAL
  Filled 2021-11-15: qty 1

## 2021-11-15 MED ORDER — ONDANSETRON HCL 4 MG/2ML IJ SOLN
4.0000 mg | INTRAMUSCULAR | Status: DC | PRN
  Administered 2021-11-15: 4 mg via INTRAVENOUS
  Filled 2021-11-15: qty 2

## 2021-11-15 NOTE — ED Triage Notes (Addendum)
Sickle cell pain Abdominal pain and back pain started today No other symptoms

## 2021-11-15 NOTE — ED Provider Notes (Signed)
[facility] Provider Note   CSN: [id number] Arrival date & time: 11/15/21  1850     History  Chief Complaint  Patient presents with   Sickle Cell Pain Crisis    Timothy Marks is a 31 y.o. male.  Patient presents to the hospital complaining of abdominal pain and back pain.  He states this is similar to previous sickle cell pain crises he has experienced.  He states that he takes an extended release morphine at home is his daily pain regimen.  He states he has been taking this as prescribed and has had no relief.  He states the pain started approximately 5 or 6 PM tonight.  Patient denying chest pain, fever, nausea, vomiting, diarrhea, priapism.  Past medical history significant for sickle cell anemia, generalized weakness, osteonecrosis of left shoulder, depression, narcotic dependence, drug-seeking behavior  HPI     Home Medications Prior to Admission medications   Medication Sig Start Date End Date Taking? Authorizing Provider  cholecalciferol (VITAMIN D) 25 MCG (1000 UNIT) tablet Take 1,000 Units by mouth daily.    [provider]  folic acid (FOLVITE) 1 MG tablet Take 1 tablet (1 mg total) by mouth daily. 07/05/21   Dorena Dew, FNP  gabapentin (NEURONTIN) 300 MG capsule TAKE 1 CAPSULE(300 MG) BY MOUTH THREE TIMES DAILY 08/24/21   Tresa Garter, MD  hydrocortisone 1 % ointment Apply 1 Application topically 2 (two) times daily. 10/14/21   Tresa Garter, MD  hydroxyurea (HYDREA) 500 MG capsule Take 1 capsule (500 mg total) by mouth 2 (two) times daily. May take with food to minimize GI side effects. 05/07/20   Tresa Garter, MD  hydrOXYzine (ATARAX) 25 MG tablet Take 25 mg by mouth daily as needed for anxiety or vomiting. 05/19/21   [provider]  mirtazapine (REMERON) 7.5 MG tablet Take 7.5 mg by mouth at bedtime as needed (for sleep). 05/19/21   [provider]  morphine (MS CONTIN) 15 MG 12 hr tablet Take  1 tablet (15 mg total) by mouth every 12 (twelve) hours. 11/15/21   Dorena Dew, FNP  OLANZapine (ZYPREXA) 5 MG tablet Take 1 tablet (5 mg total) by mouth at bedtime. 05/07/21   Dorena Dew, FNP  oxyCODONE-acetaminophen (PERCOCET) 10-325 MG tablet Take 1 tablet by mouth every 6 (six) hours as needed for up to 15 days for pain. 11/04/21 11/19/21  Dorena Dew, FNP  rivaroxaban (XARELTO) 20 MG TABS tablet Take 1 tablet (20 mg total) by mouth daily with supper. 07/05/21   Dorena Dew, FNP      Allergies    Buprenorphine hcl, Levaquin [levofloxacin], Meperidine, Morphine, Morphine and related, Tramadol, Vancomycin, and Zosyn [piperacillin sod-tazobactam so]    Review of Systems   Review of Systems  Gastrointestinal:  Positive for abdominal pain.  Musculoskeletal:  Positive for back pain.    Physical Exam Updated Vital Signs BP 110/63   Pulse 68   Temp 98.2 F (36.8 C) (Oral)   Resp 18   Ht '6\' 2"'$  (1.88 m)   Wt 56.7 kg   SpO2 99%   BMI 16.05 kg/m  Physical Exam Vitals and nursing note reviewed.  Constitutional:      General: He is not in acute distress.    Appearance: He is well-developed.  HENT:     Head: Normocephalic and atraumatic.     Mouth/Throat:     Mouth: Mucous membranes are moist.  Eyes:  Conjunctiva/sclera: Conjunctivae normal.  Cardiovascular:     Rate and Rhythm: Normal rate and regular rhythm.     Heart sounds: No murmur heard. Pulmonary:     Effort: Pulmonary effort is normal. No respiratory distress.     Breath sounds: Normal breath sounds.  Abdominal:     Palpations: Abdomen is soft.     Tenderness: There is no abdominal tenderness.  Musculoskeletal:        General: No swelling or tenderness.     Cervical back: Normal range of motion and neck supple.  Skin:    General: Skin is warm and dry.     Capillary Refill: Capillary refill takes less than 2 seconds.  Neurological:     Mental Status: He is alert and oriented to person, place,  and time.  Psychiatric:        Mood and Affect: Mood normal.     ED Results / Procedures / Treatments   Labs (all labs ordered are listed, but only abnormal results are displayed) Labs Reviewed  CBC WITH DIFFERENTIAL/PLATELET - Abnormal; Notable for the following components:      Result Value   WBC 12.8 (*)    RBC 3.04 (*)    Hemoglobin 9.3 (*)    HCT 26.0 (*)    RDW 18.2 (*)    Platelets 464 (*)    nRBC 1.1 (*)    Lymphs Abs 4.7 (*)    All other components within normal limits  RETICULOCYTES - Abnormal; Notable for the following components:   Retic Ct Pct 11.1 (*)    RBC. 3.05 (*)    Retic Count, Absolute 338.6 (*)    Immature Retic Fract 34.1 (*)    All other components within normal limits  BASIC METABOLIC PANEL - Abnormal; Notable for the following components:   Creatinine, Ser 0.59 (*)    All other components within normal limits    EKG None  Radiology No results found.  Procedures Procedures    Medications Ordered in ED Medications  diphenhydrAMINE (BENADRYL) capsule 25-50 mg (25 mg Oral Given 11/15/21 2254)  ondansetron (ZOFRAN) injection 4 mg (4 mg Intravenous Given 11/15/21 2255)  HYDROmorphone (DILAUDID) injection 2 mg (2 mg Intravenous Given 11/15/21 2255)  HYDROmorphone (DILAUDID) injection 2 mg (2 mg Intravenous Given 11/15/21 2322)  ketamine (KETALAR) injection 17 mg (17 mg Intravenous Given 11/15/21 2347)    ED Course/ Medical Decision Making/ A&P                           Medical Decision Making Amount and/or Complexity of Data Reviewed Labs: ordered.  Risk Prescription drug management.   This patient presents to the ED for concern of back pain and abdominal pain, this involves an extensive number of treatment options, and is a complaint that carries with it a high risk of complications and morbidity.  The differential diagnosis includes sickle cell pain crisis, acute on chronic pain, appendicitis, cholecystitis, pancreatitis, and  others   Co morbidities that complicate the patient evaluation  History of sickle cell crisis, chronic pain, drug-seeking behavior   Additional history obtained:  Additional history obtained from family bedside External records from outside source obtained and reviewed including notes from the sickle cell clinic from today.  Patient was prescribed 15 mg MS Contin 12-hour tablets to be taken twice daily today.  Patient was not approved to be seen in the day clinic for sickle cell today and it was recommended  the patient pick up a medication to manage crisis at home.  This was at 11 this morning   Lab Tests:  I Ordered, and personally interpreted labs.  The pertinent results include:  WBC 12.8, hemoglobin 9.3, creatinine 0.59, absolute reticulocyte count 338.6, reticulocyte percentage 11.1   Imaging Studies ordered:  I see no indication for imaging at this time.  No abdominal tenderness on palpation.   Problem List / ED Course / Critical interventions / Medication management  I ordered medication including Dilaudid for pain, Zofran for nausea, Benadryl as needed for itching.  The patient states in the past he has had a ketamine injection instead of a third dose of Dilaudid.  I discussed this with Dr. Karle Starch, the attending physician who agreed that this was a reasonable medication to administer.  Ketamine was ordered for pain control and the patient did improve Reevaluation of the patient after these medicines showed that the patient improved I have reviewed the patients home medicines and have made adjustments as needed   Test / Admission - Considered:  The patient's reticulocyte count is slightly worse than his baseline but the patient is feeling significantly better after medication.  No tenderness to suggest any acute intra-abdominal abnormality at this time.  No nausea or vomiting to suggest pancreatitis or viral illness.  At this time I believe it is reasonable for the patient to  discharge home and resume his home medications.  If his pain worsens or he develops shortness of breath, chest pain, or other life-threatening conditions he will return to the emergency department for further evaluation and care.        Final Clinical Impression(s) / ED Diagnoses Final diagnoses:  Sickle cell crisis [facility])    Rx / DC Orders ED Discharge Orders     None         Ronny Bacon 11/15/21 2356    Cristie Hem, MD 11/16/21 1159

## 2021-11-15 NOTE — Telephone Encounter (Signed)
Patient called in. Complains of pain in back and bilateral legs rates 9/10. Denied chest pain, abd pain, fever, N/V/D, or priapism. Wants to come in for treatment. Last took Percocet at 10pm. Pt states he has not been to the ED recently.  Pt denies recent covid exposure or flu like symptoms. Pt states his girlfriend is his transportation today. [place] FNP made aware, pt not approved to be seen in the day hospital today, provider to address prescription refill requests and pt to pick up home medication to manage crisis at home. Pt made aware, verbalized understanding.

## 2021-11-15 NOTE — Progress Notes (Signed)
Reviewed PDMP substance reporting system prior to prescribing opiate medications. No inconsistencies noted.  Meds ordered this encounter  Medications   morphine (MS CONTIN) 15 MG 12 hr tablet    Sig: Take 1 tablet (15 mg total) by mouth every 12 (twelve) hours.    Dispense:  60 tablet    Refill:  0    Order Specific Question:   Supervising Provider    Answer:   JEGEDE, OLUGBEMIGA E [[id number]]   Timothy Marks Lada Fulbright  APRN, MSN, FNP-C [facility] [address]  [place], [place] [address] [phone]  

## 2021-11-15 NOTE — Discharge Instructions (Signed)
You were seen today for sickle cell pain.  Please resume your home medication regimen.  If your pain becomes unbearable,you develop shortness of breath, chest pain, or other life-threatening additions please return to the emergency department

## 2021-11-16 ENCOUNTER — Other Ambulatory Visit: Payer: Self-pay | Admitting: Internal Medicine

## 2021-11-16 ENCOUNTER — Telehealth: Payer: Self-pay

## 2021-11-16 NOTE — Telephone Encounter (Signed)
Percocet

## 2021-11-18 ENCOUNTER — Other Ambulatory Visit: Payer: Self-pay | Admitting: Family Medicine

## 2021-11-18 ENCOUNTER — Telehealth: Payer: Self-pay

## 2021-11-18 DIAGNOSIS — D571 Sickle-cell disease without crisis: Secondary | ICD-10-CM

## 2021-11-18 DIAGNOSIS — G894 Chronic pain syndrome: Secondary | ICD-10-CM

## 2021-11-18 MED ORDER — OXYCODONE-ACETAMINOPHEN 10-325 MG PO TABS: 1.0000 | ORAL_TABLET | Freq: Four times a day (QID) | ORAL | 0 refills | Status: DC | PRN

## 2021-11-18 NOTE — Telephone Encounter (Signed)
Percocet

## 2021-11-18 NOTE — Progress Notes (Signed)
Reviewed PDMP substance reporting system prior to prescribing opiate medications. No inconsistencies noted.  Meds ordered this encounter  Medications   oxyCODONE-acetaminophen (PERCOCET) 10-325 MG tablet    Sig: Take 1 tablet by mouth every 6 (six) hours as needed for up to 15 days for pain.    Dispense:  60 tablet    Refill:  0    Order Specific Question:   Supervising Provider    Answer:   Tresa Garter [id number]   [name]  APRN, MSN, FNP-C [facility] [address] [place], [place] [address] [phone]

## 2021-11-28 ENCOUNTER — Emergency Department (HOSPITAL_BASED_OUTPATIENT_CLINIC_OR_DEPARTMENT_OTHER): Payer: 59

## 2021-11-28 ENCOUNTER — Other Ambulatory Visit: Payer: Self-pay

## 2021-11-28 ENCOUNTER — Emergency Department (HOSPITAL_BASED_OUTPATIENT_CLINIC_OR_DEPARTMENT_OTHER)
Admission: EM | Admit: 2021-11-28 | Discharge: 2021-11-28 | Disposition: A | Discharge status: Home / Self Care | Payer: 59 | Attending: Emergency Medicine | Admitting: Emergency Medicine

## 2021-11-28 ENCOUNTER — Encounter (HOSPITAL_BASED_OUTPATIENT_CLINIC_OR_DEPARTMENT_OTHER): Payer: Self-pay

## 2021-11-28 DIAGNOSIS — Z7901 Long term (current) use of anticoagulants: Secondary | ICD-10-CM | POA: Insufficient documentation

## 2021-11-28 DIAGNOSIS — D57219 Sickle-cell/Hb-C disease with crisis, unspecified: Secondary | ICD-10-CM | POA: Diagnosis not present

## 2021-11-28 DIAGNOSIS — D5700 Hb-SS disease with crisis, unspecified: Secondary | ICD-10-CM

## 2021-11-28 LAB — CBC WITH DIFFERENTIAL/PLATELET
Abs Immature Granulocytes: 0 10*3/uL (ref 0.00–0.07)
Basophils Absolute: 0 10*3/uL (ref 0.0–0.1)
Basophils Relative: 0 %
Eosinophils Absolute: 0.2 10*3/uL (ref 0.0–0.5)
Eosinophils Relative: 2 %
HCT: 27.6 % — ABNORMAL LOW (ref 39.0–52.0)
Hemoglobin: 9.7 g/dL — ABNORMAL LOW (ref 13.0–17.0)
Lymphocytes Relative: 13 %
Lymphs Abs: 1.4 10*3/uL (ref 0.7–4.0)
MCH: 30.8 pg (ref 26.0–34.0)
MCHC: 35.1 g/dL (ref 30.0–36.0)
MCV: 87.6 fL (ref 80.0–100.0)
Monocytes Absolute: 0.7 10*3/uL (ref 0.1–1.0)
Monocytes Relative: 6 %
Neutro Abs: 8.8 10*3/uL — ABNORMAL HIGH (ref 1.7–7.7)
Neutrophils Relative %: 79 %
Platelets: 460 10*3/uL — ABNORMAL HIGH (ref 150–400)
RBC: 3.15 MIL/uL — ABNORMAL LOW (ref 4.22–5.81)
RDW: 19.5 % — ABNORMAL HIGH (ref 11.5–15.5)
Smear Review: NORMAL
WBC: 11.1 10*3/uL — ABNORMAL HIGH (ref 4.0–10.5)
nRBC: 3.1 % — ABNORMAL HIGH (ref 0.0–0.2)

## 2021-11-28 LAB — COMPREHENSIVE METABOLIC PANEL
ALT: 8 U/L (ref 0–44)
AST: 25 U/L (ref 15–41)
Albumin: 4.8 g/dL (ref 3.5–5.0)
Alkaline Phosphatase: 64 U/L (ref 38–126)
Anion gap: 7 (ref 5–15)
BUN: 7 mg/dL (ref 6–20)
CO2: 26 mmol/L (ref 22–32)
Calcium: 9.6 mg/dL (ref 8.9–10.3)
Chloride: 104 mmol/L (ref 98–111)
Creatinine, Ser: 0.56 mg/dL — ABNORMAL LOW (ref 0.61–1.24)
GFR, Estimated: >60 mL/min (ref >60)
Glucose, Bld: 91 mg/dL (ref 70–99)
Potassium: 4.2 mmol/L (ref 3.5–5.1)
Sodium: 137 mmol/L (ref 135–145)
Total Bilirubin: 3.7 mg/dL — ABNORMAL HIGH (ref 0.3–1.2)
Total Protein: 7.9 g/dL (ref 6.5–8.1)

## 2021-11-28 LAB — RETICULOCYTES
Immature Retic Fract: 35.8 % — ABNORMAL HIGH (ref 2.3–15.9)
RBC.: 3.16 MIL/uL — ABNORMAL LOW (ref 4.22–5.81)
Retic Count, Absolute: 467.5 10*3/uL — ABNORMAL HIGH (ref 19.0–186.0)
Retic Ct Pct: 13.8 % — ABNORMAL HIGH (ref 0.4–3.1)

## 2021-11-28 MED ORDER — KETAMINE HCL 10 MG/ML IJ SOLN
0.1000 mg/kg | Freq: Once | INTRAMUSCULAR | Status: AC
  Administered 2021-11-28: 5.7 mg via INTRAVENOUS
  Filled 2021-11-28: qty 1

## 2021-11-28 MED ORDER — KETAMINE BOLUS VIA INFUSION: 0.1000 mg/kg | Freq: Once | INTRAVENOUS | Status: DC

## 2021-11-28 MED ORDER — HYDROMORPHONE HCL 1 MG/ML IJ SOLN
0.5000 mg | Freq: Once | INTRAMUSCULAR | Status: AC
  Administered 2021-11-28: 0.5 mg via SUBCUTANEOUS
  Filled 2021-11-28: qty 1

## 2021-11-28 MED ORDER — LACTATED RINGERS IV BOLUS
1000.0000 mL | Freq: Once | INTRAVENOUS | Status: AC
  Administered 2021-11-28: 1000 mL via INTRAVENOUS

## 2021-11-28 MED ORDER — HYDROMORPHONE HCL 1 MG/ML IJ SOLN
1.0000 mg | Freq: Once | INTRAMUSCULAR | Status: AC
  Administered 2021-11-28: 1 mg via SUBCUTANEOUS
  Filled 2021-11-28: qty 1

## 2021-11-28 MED ORDER — LACTATED RINGERS IV BOLUS
500.0000 mL | Freq: Once | INTRAVENOUS | Status: AC
  Administered 2021-11-28: 500 mL via INTRAVENOUS

## 2021-11-28 MED ORDER — OXYCODONE-ACETAMINOPHEN 5-325 MG PO TABS
2.0000 | ORAL_TABLET | Freq: Once | ORAL | Status: AC
  Administered 2021-11-28: 2 via ORAL
  Filled 2021-11-28: qty 2

## 2021-11-28 NOTE — ED Triage Notes (Signed)
[facility] arrives [facility] from [facility]. Reports he began having a sickle cell pain crisis last night. Pt c/o pain to left ankle, chest, and lower back. Pt denies any other associated symptoms

## 2021-11-28 NOTE — Discharge Instructions (Addendum)
Note your visit today was overall reassuring.  Continue at home medication regimen.  Continue oral hydration.  Close follow-up with sickle cell clinic recommended for reevaluation of your symptoms.  Please do not hesitate to return to emergency department for worrisome signs symptoms we discussed, parent.

## 2021-11-28 NOTE — ED Provider Notes (Signed)
[facility] Provider Note   CSN: [id number] Arrival date & time: 11/28/21  1137     History  Chief Complaint  Patient presents with   Sickle Cell Pain Crisis    Timothy Marks is a 31 y.o. male.   Sickle Cell Pain Crisis   31 year old male presents emergency department with complaints of sickle cell pain.  Patient states symptoms began last night around the time of going to bed.  Patient is currently complaining of bilateral chest pain beginning on lateral aspects of rib cage radiating toward sternum bilaterally.  Denies any associated shortness of breath.  He is also complaining of left ankle pain.  Patient states that symptoms are similar to pain in the past experienced with sickle cell.  He reports taking his at home extended release morphine and Percocet yesterday afternoon but is taken nothing for his pain today.  He presents emergency department secondary to current pain episode.  Denies fever, chills, night sweats, abdominal pain, nausea, vomiting, urinary symptoms, change in bowel habits, priapism, palpitations.  Patient denies any known traumatic events leading to pain.  Past medical history significant for Sickle cell anemia, sickle cell pain crisis, DVT, opioid dependency, priapism Home Medications Prior to Admission medications   Medication Sig Start Date End Date Taking? Authorizing Provider  cholecalciferol (VITAMIN D) 25 MCG (1000 UNIT) tablet Take 1,000 Units by mouth daily.    [provider]  folic acid (FOLVITE) 1 MG tablet Take 1 tablet (1 mg total) by mouth daily. 07/05/21   Dorena Dew, FNP  gabapentin (NEURONTIN) 300 MG capsule TAKE 1 CAPSULE(300 MG) BY MOUTH THREE TIMES DAILY 08/24/21   Tresa Garter, MD  hydrocortisone 1 % ointment Apply 1 Application topically 2 (two) times daily. 10/14/21   Tresa Garter, MD  hydroxyurea (HYDREA) 500 MG capsule Take 1 capsule (500 mg total) by mouth 2 (two) times daily.  May take with food to minimize GI side effects. 05/07/20   Tresa Garter, MD  hydrOXYzine (ATARAX) 25 MG tablet Take 25 mg by mouth daily as needed for anxiety or vomiting. 05/19/21   [provider]  mirtazapine (REMERON) 7.5 MG tablet Take 7.5 mg by mouth at bedtime as needed (for sleep). 05/19/21   [provider]  morphine (MS CONTIN) 15 MG 12 hr tablet Take 1 tablet (15 mg total) by mouth every 12 (twelve) hours. 11/15/21   Dorena Dew, FNP  OLANZapine (ZYPREXA) 5 MG tablet Take 1 tablet (5 mg total) by mouth at bedtime. 05/07/21   Dorena Dew, FNP  oxyCODONE-acetaminophen (PERCOCET) 10-325 MG tablet Take 1 tablet by mouth every 6 (six) hours as needed for up to 15 days for pain. 11/19/21 12/04/21  Dorena Dew, FNP  rivaroxaban (XARELTO) 20 MG TABS tablet Take 1 tablet (20 mg total) by mouth daily with supper. 07/05/21   Dorena Dew, FNP      Allergies    Buprenorphine hcl, Levaquin [levofloxacin], Meperidine, Morphine, Morphine and related, Tramadol, Vancomycin, and Zosyn [piperacillin sod-tazobactam so]    Review of Systems   Review of Systems  All other systems reviewed and are negative.   Physical Exam Updated Vital Signs BP 121/74   Pulse 61   Temp 98.6 F (37 C) (Oral)   Resp 13   Ht '6\' 2"'$  (1.88 m)   Wt 56.7 kg   SpO2 100%   BMI 16.05 kg/m  Physical Exam Vitals and nursing note reviewed.  Constitutional:  General: He is not in acute distress.    Appearance: He is well-developed.  HENT:     Head: Normocephalic and atraumatic.  Eyes:     Conjunctiva/sclera: Conjunctivae normal.  Cardiovascular:     Rate and Rhythm: Normal rate and regular rhythm.     Heart sounds: No murmur heard. Pulmonary:     Effort: Pulmonary effort is normal. No respiratory distress.     Breath sounds: Normal breath sounds. No wheezing or rales.  Abdominal:     Palpations: Abdomen is soft.     Tenderness: There is no abdominal tenderness.   Musculoskeletal:        General: No swelling.     Cervical back: Neck supple.     Right lower leg: No edema.     Left lower leg: No edema.     Comments: Tenderness to palpation of bilateral lateral ribs.  Diffuse tenderness to palpation of left ankle.  Patient has full active range of motion of affected ankle.  Dorsalis pedis pulses full intact bilaterally.  No overlying skin abnormalities including swelling, erythema, fluctuance, ecchymosis.  Skin:    General: Skin is warm and dry.     Capillary Refill: Capillary refill takes less than 2 seconds.  Neurological:     Mental Status: He is alert.  Psychiatric:        Mood and Affect: Mood normal.     ED Results / Procedures / Treatments   Labs (all labs ordered are listed, but only abnormal results are displayed) Labs Reviewed  COMPREHENSIVE METABOLIC PANEL - Abnormal; Notable for the following components:      Result Value   Creatinine, Ser 0.56 (*)    Total Bilirubin 3.7 (*)    All other components within normal limits  CBC WITH DIFFERENTIAL/PLATELET - Abnormal; Notable for the following components:   WBC 11.1 (*)    RBC 3.15 (*)    Hemoglobin 9.7 (*)    HCT 27.6 (*)    RDW 19.5 (*)    Platelets 460 (*)    nRBC 3.1 (*)    Neutro Abs 8.8 (*)    All other components within normal limits  RETICULOCYTES - Abnormal; Notable for the following components:   Retic Ct Pct 13.8 (*)    RBC. 3.16 (*)    Retic Count, Absolute 467.5 (*)    Immature Retic Fract 35.8 (*)    All other components within normal limits    EKG EKG Interpretation  Date/Time:  Sunday November 28 2021 11:52:30 EDT Ventricular Rate:  73 PR Interval:  146 QRS Duration: 92 QT Interval:  360 QTC Calculation: 396 R Axis:   87 Text Interpretation: Normal sinus rhythm Normal ECG Nonspecific ST changes in anterior leads No STEMI Confirmed by [name] Lemming ([id number]) on 11/28/2021 12:38:07 PM  Radiology DG Chest Port 1 View  Result Date: 11/28/2021 CLINICAL  DATA:  31 year old male with history of chest pain. Sickle cell crisis. EXAM: PORTABLE CHEST 1 VIEW COMPARISON:  Chest x-ray 06/24/2021. FINDINGS: Lung volumes are normal. No consolidative airspace disease. No pleural effusions. No pneumothorax. No pulmonary nodule or mass noted. Pulmonary vasculature and the cardiomediastinal silhouette are within normal limits. Diffuse sclerosis throughout the visualized skeleton, presumably bony changes related to known sickle cell disease. IMPRESSION: 1. No radiographic evidence of acute cardiopulmonary disease. Electronically Signed   By: Vinnie Langton M.D.   On: 11/28/2021 12:18    Procedures Procedures    Medications Ordered in ED Medications  lactated ringers  bolus 1,000 mL (0 mLs Intravenous Stopped 11/28/21 1510)  oxyCODONE-acetaminophen (PERCOCET/ROXICET) 5-325 MG per tablet 2 tablet (2 tablets Oral Given 11/28/21 1249)  HYDROmorphone (DILAUDID) injection 0.5 mg (0.5 mg Subcutaneous Given 11/28/21 1358)  ketamine (KETALAR) injection 5.7 mg (5.7 mg Intravenous Given 11/28/21 1541)  lactated ringers bolus 500 mL (500 mLs Intravenous New Bag/Given 11/28/21 1543)    ED Course/ Medical Decision Making/ A&P                           Medical Decision Making Amount and/or Complexity of Data Reviewed Labs: ordered. Radiology: ordered.  Risk Prescription drug management.   This patient presents to the ED for concern of sickle cell pain, this involves an extensive number of treatment options, and is a complaint that carries with it a high risk of complications and morbidity.  The differential diagnosis includes pneumonia, pneumothorax, ACS, sickle cell pain crisis, fracture, dislocation, strain/sprain, PE   Co morbidities that complicate the patient evaluation  See HPI   Additional history obtained:  Additional history obtained from EMR External records from outside source obtained and reviewed including hospital records   Lab Tests:  I  Ordered, and personally interpreted labs.  The pertinent results include: Mild leukocytosis of 11.1 likely reactive to patient's pain.  Hemoglobin of 9.7 which is slightly increased from patient's previous drawn hemoglobin; baseline seems to be similar between 9 and 10.  Mild increase in platelets of 460.  No electrolyte abnormalities.  Renal function within normal limits.  No transaminitis noted.  Reticulocyte count percentage 13.8, absolute site count, 467.5, immature reticulocyte 35.8.  No concern at this time for aplastic crisis.   Imaging Studies ordered:  I ordered imaging studies including chest x-ray I independently visualized and interpreted imaging which showed no acute cardiopulmonary abnormality. I agree with the radiologist interpretation   Cardiac Monitoring: / EKG:  The patient was maintained on a cardiac monitor.  I personally viewed and interpreted the cardiac monitored which showed an underlying rhythm of: Sinus rhythm   Consultations Obtained:  N/a   Problem List / ED Course / Critical interventions / Medication management  Sickle cell pain I ordered medication including Percocet for pain, Dilaudid for pain, 1 L of lactated Ringer's for rehydration.  Ketamine for sickle cell pain. Reevaluation of the patient after these medicines showed that the patient improved I have reviewed the patients home medicines and have made adjustments as needed   Social Determinants of Health:  Chronic opioid use.   Test / Admission - Considered:  Sickle cell pain Vitals signs  within normal range and stable throughout visit. Laboratory/imaging studies significant for: See above Patient symptoms likely secondary to sickle cell pain.  No concern at this time for acute chest syndrome, aplastic crisis or priapism.  Patient's vitals within normal range and stable throughout visit.  Patient noted significant improvement with medicines and fluids administered in the emergency  department.  Patient recommended continue at home pain therapy with close follow-up with sickle cell clinic.  Also recommend proper oral hydration.  Treatment plan discussed with patient he did not understand was agreeable to said plan. Worrisome signs and symptoms were discussed with the patient, and the patient acknowledged understanding to return to the ED if noticed. Patient was stable upon discharge.          Final Clinical Impression(s) / ED Diagnoses Final diagnoses:  Sickle cell anemia with pain ([place])    Rx /  DC Orders ED Discharge Orders     None         Wilnette Kales, [place] 11/28/21 1557    [name] Lemming, MD 11/29/21 [phone]

## 2021-11-29 ENCOUNTER — Telehealth: Payer: Self-pay

## 2021-11-29 ENCOUNTER — Other Ambulatory Visit: Payer: Self-pay | Admitting: Family Medicine

## 2021-11-29 DIAGNOSIS — D571 Sickle-cell disease without crisis: Secondary | ICD-10-CM

## 2021-11-29 DIAGNOSIS — G894 Chronic pain syndrome: Secondary | ICD-10-CM

## 2021-11-29 MED ORDER — OXYCODONE-ACETAMINOPHEN 10-325 MG PO TABS: 1.0000 | ORAL_TABLET | Freq: Four times a day (QID) | ORAL | 0 refills | Status: DC | PRN

## 2021-11-29 NOTE — Telephone Encounter (Signed)
Percocet

## 2021-11-29 NOTE — Progress Notes (Signed)
Patient is requesting this medication 5 days early.  He will not be able to fill it until 12/04/2021.  Reviewed PDMP substance reporting system prior to prescribing opiate medications. No inconsistencies noted.  Meds ordered this encounter  Medications   oxyCODONE-acetaminophen (PERCOCET) 10-325 MG tablet    Sig: Take 1 tablet by mouth every 6 (six) hours as needed for up to 15 days for pain.    Dispense:  60 tablet    Refill:  0    Order Specific Question:   Supervising Provider    Answer:   Tresa Garter [[id number]]     [name]  APRN, MSN, FNP-C [facility] [address] [place], [place] [address] [phone]

## 2021-12-01 ENCOUNTER — Other Ambulatory Visit: Payer: Self-pay | Admitting: Family Medicine

## 2021-12-01 ENCOUNTER — Telehealth: Payer: Self-pay | Admitting: Internal Medicine

## 2021-12-01 DIAGNOSIS — D571 Sickle-cell disease without crisis: Secondary | ICD-10-CM

## 2021-12-01 DIAGNOSIS — G894 Chronic pain syndrome: Secondary | ICD-10-CM

## 2021-12-01 NOTE — Telephone Encounter (Signed)
Percocet refill request due tomorrow

## 2021-12-03 ENCOUNTER — Other Ambulatory Visit (HOSPITAL_COMMUNITY): Payer: Self-pay

## 2021-12-03 ENCOUNTER — Other Ambulatory Visit: Payer: Self-pay | Admitting: Internal Medicine

## 2021-12-03 DIAGNOSIS — D571 Sickle-cell disease without crisis: Secondary | ICD-10-CM

## 2021-12-03 DIAGNOSIS — G894 Chronic pain syndrome: Secondary | ICD-10-CM

## 2021-12-03 MED ORDER — OXYCODONE-ACETAMINOPHEN 10-325 MG PO TABS: 1.0000 | ORAL_TABLET | Freq: Four times a day (QID) | ORAL | 0 refills | Status: AC | PRN

## 2021-12-14 ENCOUNTER — Telehealth: Payer: Self-pay

## 2021-12-14 ENCOUNTER — Other Ambulatory Visit: Payer: Self-pay | Admitting: Internal Medicine

## 2021-12-14 NOTE — Telephone Encounter (Signed)
Morphine

## 2021-12-15 ENCOUNTER — Other Ambulatory Visit: Payer: Self-pay | Admitting: Internal Medicine

## 2021-12-15 ENCOUNTER — Other Ambulatory Visit: Payer: Self-pay | Admitting: Family Medicine

## 2021-12-15 DIAGNOSIS — D571 Sickle-cell disease without crisis: Secondary | ICD-10-CM

## 2021-12-15 DIAGNOSIS — G894 Chronic pain syndrome: Secondary | ICD-10-CM

## 2021-12-15 MED ORDER — MORPHINE SULFATE ER 15 MG PO TBCR: 15.0000 mg | EXTENDED_RELEASE_TABLET | Freq: Two times a day (BID) | ORAL | 0 refills | Status: DC

## 2021-12-16 ENCOUNTER — Other Ambulatory Visit: Payer: Self-pay

## 2021-12-16 DIAGNOSIS — D571 Sickle-cell disease without crisis: Secondary | ICD-10-CM

## 2021-12-16 DIAGNOSIS — G894 Chronic pain syndrome: Secondary | ICD-10-CM

## 2021-12-17 ENCOUNTER — Other Ambulatory Visit: Payer: Self-pay | Admitting: Family Medicine

## 2021-12-17 ENCOUNTER — Other Ambulatory Visit: Payer: 59

## 2021-12-17 ENCOUNTER — Other Ambulatory Visit: Payer: Self-pay

## 2021-12-17 DIAGNOSIS — D571 Sickle-cell disease without crisis: Secondary | ICD-10-CM

## 2021-12-17 DIAGNOSIS — G894 Chronic pain syndrome: Secondary | ICD-10-CM

## 2021-12-17 DIAGNOSIS — E559 Vitamin D deficiency, unspecified: Secondary | ICD-10-CM

## 2021-12-17 NOTE — Telephone Encounter (Signed)
Requesting prescription be sent to pharmacy so he can pick up tomorrow

## 2021-12-17 NOTE — Progress Notes (Signed)
Orders Placed This Encounter  Procedures   Sickle Cell Panel    Standing Status:   Future    Standing Expiration Date:   12/18/2022   818299 11+Oxyco+Alc+Crt-Bund    Standing Status:   Future    Standing Expiration Date:   12/18/2022   [name]  APRN, MSN, FNP-C [facility] [address] [place], [place] [address] [phone]

## 2021-12-17 NOTE — Telephone Encounter (Signed)
Pt coming in today for labs and virtual is scheduled for Tuesday. [place]

## 2021-12-18 LAB — CMP14+CBC/D/PLT+FER+RETIC+V...
ALT: 8 IU/L (ref 0–44)
AST: 25 IU/L (ref 0–40)
Albumin/Globulin Ratio: 1.6 (ref 1.2–2.2)
Albumin: 4.5 g/dL (ref 4.1–5.1)
Alkaline Phosphatase: 85 IU/L (ref 44–121)
BUN/Creatinine Ratio: 6 — ABNORMAL LOW (ref 9–20)
BUN: 5 mg/dL — ABNORMAL LOW (ref 6–20)
Basophils Absolute: 0 10*3/uL (ref 0.0–0.2)
Basos: 0 %
Bilirubin Total: 2.9 mg/dL — ABNORMAL HIGH (ref 0.0–1.2)
CO2: 20 mmol/L (ref 20–29)
Calcium: 9.3 mg/dL (ref 8.7–10.2)
Chloride: 103 mmol/L (ref 96–106)
Creatinine, Ser: 0.78 mg/dL (ref 0.76–1.27)
EOS (ABSOLUTE): 0.5 10*3/uL — ABNORMAL HIGH (ref 0.0–0.4)
Eos: 6 %
Ferritin: 2688 ng/mL — ABNORMAL HIGH (ref 30–400)
Globulin, Total: 2.8 g/dL (ref 1.5–4.5)
Glucose: 99 mg/dL (ref 70–99)
Hematocrit: 28.4 % — ABNORMAL LOW (ref 37.5–51.0)
Hemoglobin: 9.6 g/dL — ABNORMAL LOW (ref 13.0–17.7)
Immature Grans (Abs): 0 10*3/uL (ref 0.0–0.1)
Immature Granulocytes: 0 %
Lymphocytes Absolute: 2.6 10*3/uL (ref 0.7–3.1)
Lymphs: 27 %
MCH: 29.7 pg (ref 26.6–33.0)
MCHC: 33.8 g/dL (ref 31.5–35.7)
MCV: 88 fL (ref 79–97)
Monocytes Absolute: 0.7 10*3/uL (ref 0.1–0.9)
Monocytes: 7 %
NRBC: 2 % — ABNORMAL HIGH (ref 0–0)
Neutrophils Absolute: 5.8 10*3/uL (ref 1.4–7.0)
Neutrophils: 60 %
Platelets: 421 10*3/uL (ref 150–450)
Potassium: 4 mmol/L (ref 3.5–5.2)
RBC: 3.23 x10E6/uL — ABNORMAL LOW (ref 4.14–5.80)
RDW: 17.3 % — ABNORMAL HIGH (ref 11.6–15.4)
Retic Ct Pct: 8.6 % — ABNORMAL HIGH (ref 0.6–2.6)
Sodium: 141 mmol/L (ref 134–144)
Total Protein: 7.3 g/dL (ref 6.0–8.5)
Vit D, 25-Hydroxy: 30.5 ng/mL (ref 30.0–100.0)
WBC: 9.7 10*3/uL (ref 3.4–10.8)
eGFR: 121 mL/min/{1.73_m2} (ref >59)

## 2021-12-21 ENCOUNTER — Telehealth (INDEPENDENT_AMBULATORY_CARE_PROVIDER_SITE_OTHER): Payer: 59 | Admitting: Family Medicine

## 2021-12-21 ENCOUNTER — Encounter: Payer: Self-pay | Admitting: Family Medicine

## 2021-12-21 VITALS — Ht 74.0 in | Wt 111.0 lb

## 2021-12-21 DIAGNOSIS — G894 Chronic pain syndrome: Secondary | ICD-10-CM

## 2021-12-21 DIAGNOSIS — D571 Sickle-cell disease without crisis: Secondary | ICD-10-CM

## 2021-12-21 MED ORDER — HYDROXYUREA 500 MG PO CAPS: 500.0000 mg | ORAL_CAPSULE | Freq: Two times a day (BID) | ORAL | 2 refills | Status: DC

## 2021-12-21 MED ORDER — OXYCODONE-ACETAMINOPHEN 10-325 MG PO TABS: 1.0000 | ORAL_TABLET | Freq: Four times a day (QID) | ORAL | 0 refills | Status: DC | PRN

## 2021-12-23 LAB — DRUG SCREEN 764883 11+OXYCO+ALC+CRT-BUND
Amphetamines, Urine: NEGATIVE ng/mL
BENZODIAZ UR QL: NEGATIVE ng/mL
Barbiturate: NEGATIVE ng/mL
Cocaine (Metabolite): NEGATIVE ng/mL
Creatinine: 132.5 mg/dL (ref 20.0–300.0)
Ethanol: NEGATIVE %
Meperidine: NEGATIVE ng/mL
Methadone Screen, Urine: NEGATIVE ng/mL
Oxycodone/Oxymorphone, Urine: NEGATIVE ng/mL
Phencyclidine: NEGATIVE ng/mL
Propoxyphene: NEGATIVE ng/mL
Tramadol: NEGATIVE ng/mL
pH, Urine: 5.6 (ref 4.5–8.9)

## 2021-12-23 LAB — OPIATES CONFIRMATION, URINE
Codeine: NEGATIVE
Hydrocodone: NEGATIVE
Hydromorphone: NEGATIVE
Morphine Confirm: >3000 ng/mL
Morphine: POSITIVE — AB
Opiates: POSITIVE ng/mL — AB

## 2021-12-23 LAB — CANNABINOID CONFIRMATION, UR
CANNABINOIDS: POSITIVE — AB
Carboxy THC GC/MS Conf: >750 ng/mL

## 2022-01-03 ENCOUNTER — Other Ambulatory Visit: Payer: Self-pay | Admitting: Family Medicine

## 2022-01-03 DIAGNOSIS — G894 Chronic pain syndrome: Secondary | ICD-10-CM

## 2022-01-03 MED ORDER — OXYCODONE-ACETAMINOPHEN 10-325 MG PO TABS: 1.0000 | ORAL_TABLET | Freq: Four times a day (QID) | ORAL | 0 refills | Status: DC | PRN

## 2022-01-03 NOTE — Telephone Encounter (Signed)
Reviewed PDMP substance reporting system prior to prescribing opiate medications. No inconsistencies noted.  Meds ordered this encounter  Medications   oxyCODONE-acetaminophen (PERCOCET) 10-325 MG tablet    Sig: Take 1 tablet by mouth every 6 (six) hours as needed for pain.    Dispense:  60 tablet    Refill:  0    Order Specific Question:   Supervising Provider    Answer:   JEGEDE, OLUGBEMIGA E [[id number]]   Solomia Harrell Moore Nelsy Madonna  APRN, MSN, FNP-C [facility] [address]  [place], [place] [address] [phone]  

## 2022-01-03 NOTE — Telephone Encounter (Signed)
From: [name] To: Office of Cammie Sickle, [place] Sent: 01/03/2022 9:36 AM EST Subject: Medication Renewal Request  Refills have been requested for the following medications:   oxyCODONE-acetaminophen (PERCOCET) 10-325 MG tablet [Toma Erichsen]  Patient Comment: Its due Wednesday   Preferred pharmacy: [id number] - [place], [place] AT [address] Delivery method: [employer]

## 2022-01-04 ENCOUNTER — Other Ambulatory Visit: Payer: Self-pay | Admitting: Internal Medicine

## 2022-01-04 ENCOUNTER — Other Ambulatory Visit: Payer: Self-pay | Admitting: Family Medicine

## 2022-01-04 ENCOUNTER — Other Ambulatory Visit (HOSPITAL_COMMUNITY): Payer: Self-pay

## 2022-01-04 DIAGNOSIS — G894 Chronic pain syndrome: Secondary | ICD-10-CM

## 2022-01-04 MED ORDER — OXYCODONE-ACETAMINOPHEN 10-325 MG PO TABS
1.0000 | ORAL_TABLET | Freq: Four times a day (QID) | ORAL | 0 refills | Status: DC | PRN
  Filled 2022-01-05: qty 60, 15d supply, fill #0

## 2022-01-04 NOTE — Progress Notes (Signed)
Patient is original pharmacy out of stock for Percocet 10-325 mg every 6 hours.  Medication sent to [name] today.  Patient will be able to fill medication on 01/05/2022.  Meds ordered this encounter  Medications   oxyCODONE-acetaminophen (PERCOCET) 10-325 MG tablet    Sig: Take 1 tablet by mouth every 6 (six) hours as needed for pain.    Dispense:  60 tablet    Refill:  0    Order Specific Question:   Supervising Provider    Answer:   Tresa Garter [[id number]]     [name]  APRN, MSN, FNP-C Patient [facility] [address] [place], [place] [address] [phone]

## 2022-01-04 NOTE — Telephone Encounter (Signed)
Percocet  To be sent to [facility] (In [place])

## 2022-01-05 ENCOUNTER — Other Ambulatory Visit (HOSPITAL_COMMUNITY): Payer: Self-pay

## 2022-01-05 ENCOUNTER — Other Ambulatory Visit: Payer: Self-pay

## 2022-01-05 ENCOUNTER — Emergency Department (HOSPITAL_BASED_OUTPATIENT_CLINIC_OR_DEPARTMENT_OTHER)
Admission: EM | Admit: 2022-01-05 | Discharge: 2022-01-05 | Disposition: A | Discharge status: Home / Self Care | Payer: 59 | Attending: Emergency Medicine | Admitting: Emergency Medicine

## 2022-01-05 DIAGNOSIS — Z7901 Long term (current) use of anticoagulants: Secondary | ICD-10-CM | POA: Insufficient documentation

## 2022-01-05 DIAGNOSIS — D5720 Sickle-cell/Hb-C disease without crisis: Secondary | ICD-10-CM | POA: Insufficient documentation

## 2022-01-05 DIAGNOSIS — M79631 Pain in right forearm: Secondary | ICD-10-CM | POA: Diagnosis present

## 2022-01-05 DIAGNOSIS — D571 Sickle-cell disease without crisis: Secondary | ICD-10-CM

## 2022-01-05 DIAGNOSIS — G894 Chronic pain syndrome: Secondary | ICD-10-CM | POA: Diagnosis not present

## 2022-01-05 LAB — CBC WITH DIFFERENTIAL/PLATELET
HCT: 30.5 % — ABNORMAL LOW (ref 39.0–52.0)
Hemoglobin: 10.4 g/dL — ABNORMAL LOW (ref 13.0–17.0)
MCH: 31.3 pg (ref 26.0–34.0)
MCHC: 34.1 g/dL (ref 30.0–36.0)
MCV: 91.9 fL (ref 80.0–100.0)
Platelets: 460 K/uL — ABNORMAL HIGH (ref 150–400)
RBC: 3.32 MIL/uL — ABNORMAL LOW (ref 4.22–5.81)
RDW: 19.4 % — ABNORMAL HIGH (ref 11.5–15.5)

## 2022-01-05 MED ORDER — HYDROMORPHONE HCL 1 MG/ML IJ SOLN
2.0000 mg | Freq: Once | INTRAMUSCULAR | Status: AC
  Administered 2022-01-05: 2 mg via INTRAVENOUS
  Filled 2022-01-05: qty 2

## 2022-01-05 NOTE — ED Provider Notes (Signed)
[facility] EMERGENCY DEPT  Provider Note  CSN: [id number] Arrival date & time: 01/05/22 0455  History Chief Complaint  Patient presents with   Sickle Cell Pain Crisis    Timothy Marks is a 31 y.o. male with history of Hgb SS disease and chronic pain reports pain in his R forearm, mostly at night for the last 6 days since he ran out of his short acting oxycodone. Still has his long acting morphine. He is scheduled to pick up a refill of his Percocet in a couple of hours. No fever, no CP or SOB. No change from his typical pain.    Home Medications Prior to Admission medications   Medication Sig Start Date End Date Taking? Authorizing Provider  cholecalciferol (VITAMIN D) 25 MCG (1000 UNIT) tablet Take 1,000 Units by mouth daily.    [provider]  folic acid (FOLVITE) 1 MG tablet Take 1 tablet (1 mg total) by mouth daily. 07/05/21   Dorena Dew, FNP  gabapentin (NEURONTIN) 300 MG capsule TAKE 1 CAPSULE(300 MG) BY MOUTH THREE TIMES DAILY 08/24/21   Tresa Garter, MD  hydrocortisone 1 % ointment Apply 1 Application topically 2 (two) times daily. 10/14/21   Tresa Garter, MD  hydroxyurea (HYDREA) 500 MG capsule Take 1 capsule (500 mg total) by mouth 2 (two) times daily. May take with food to minimize GI side effects. 12/21/21   Dorena Dew, FNP  hydrOXYzine (ATARAX) 25 MG tablet Take 25 mg by mouth daily as needed for anxiety or vomiting. 05/19/21   [provider]  mirtazapine (REMERON) 7.5 MG tablet Take 7.5 mg by mouth at bedtime as needed (for sleep). 05/19/21   [provider]  morphine (MS CONTIN) 15 MG 12 hr tablet Take 1 tablet (15 mg total) by mouth every 12 (twelve) hours. 12/16/21   Tresa Garter, MD  OLANZapine (ZYPREXA) 5 MG tablet Take 1 tablet (5 mg total) by mouth at bedtime. 05/07/21   Dorena Dew, FNP  oxyCODONE-acetaminophen (PERCOCET) 10-325 MG tablet Take 1 tablet by mouth every 6 (six) hours as  needed for pain. 01/05/22   Dorena Dew, FNP  rivaroxaban (XARELTO) 20 MG TABS tablet Take 1 tablet (20 mg total) by mouth daily with supper. 07/05/21   Dorena Dew, FNP     Allergies    Buprenorphine hcl, Levaquin [levofloxacin], Meperidine, Morphine, Morphine and related, Tramadol, Vancomycin, and Zosyn [piperacillin sod-tazobactam so]   Review of Systems   Review of Systems Please see HPI for pertinent positives and negatives  Physical Exam BP 121/67   Pulse 89   Temp 98.2 F (36.8 C) (Oral)   Resp 18   Ht '6\' 2"'$  (1.88 m)   Wt 56.7 kg   SpO2 100%   BMI 16.05 kg/m   Physical Exam Vitals and nursing note reviewed.  Constitutional:      Appearance: Normal appearance.  HENT:     Head: Normocephalic and atraumatic.     Nose: Nose normal.     Mouth/Throat:     Mouth: Mucous membranes are moist.  Eyes:     Extraocular Movements: Extraocular movements intact.     Conjunctiva/sclera: Conjunctivae normal.  Cardiovascular:     Rate and Rhythm: Normal rate.     Heart sounds: Normal heart sounds.  Pulmonary:     Effort: Pulmonary effort is normal.     Breath sounds: Normal breath sounds.  Abdominal:     General: Abdomen is flat.  Palpations: Abdomen is soft.     Tenderness: There is no abdominal tenderness.  Musculoskeletal:        General: Tenderness (soft tissue tenderness of R forearm, compartments soft) present. No swelling. Normal range of motion.     Cervical back: Neck supple.  Skin:    General: Skin is warm and dry.  Neurological:     General: No focal deficit present.     Mental Status: He is alert.  Psychiatric:        Mood and Affect: Mood normal.     ED Results / Procedures / Treatments   EKG None  Procedures Procedures  Medications Ordered in the ED Medications  HYDROmorphone (DILAUDID) injection 2 mg (2 mg Intravenous Given 01/05/22 0529)    Initial Impression and Plan  Patient here with exacerbation of chronic pain due to  sickle cell disease since running out of his percocet. He has a refill available to pick up later this morning. He has normal vitals and reassuring exam. No indication for labs. No concern for acute vaso-occlusive or aplastic crisis. No signs of acute chest. Will give a dose of dilaudid and discharge home. He drove himself here, he understands he has to have a ride home.   ED Course   Clinical Course as of 01/05/22 0543  Wed Jan 05, 2022  0527 Labs were ordered prior to my evaluation. CBC shows Hgb improved from baseline. The remaining labs have been cancelled.  [CS]    Clinical Course User Index [CS] Truddie Hidden, MD     MDM Rules/Calculators/A&P Medical Decision Making Problems Addressed: Chronic pain syndrome: chronic illness or injury with exacerbation, progression, or side effects of treatment Sickle cell disease without crisis [facility]): chronic illness or injury  Amount and/or Complexity of Data Reviewed Labs: ordered. Decision-making details documented in ED Course.  Risk Parenteral controlled substances.    Final Clinical Impression(s) / ED Diagnoses Final diagnoses:  Chronic pain syndrome  Sickle cell disease without crisis [facility])    Rx / DC Orders ED Discharge Orders     None        Truddie Hidden, MD 01/05/22 [phone]

## 2022-01-05 NOTE — ED Triage Notes (Signed)
Pt states that he has had increase in lower back and right arm pain x 4-5 days, Pt states that this is similar to past sickle cell crisis exacerbations

## 2022-01-05 NOTE — ED Notes (Signed)
Pt requesting more pain medication. Provider aware

## 2022-01-12 ENCOUNTER — Other Ambulatory Visit: Payer: Self-pay | Admitting: Family Medicine

## 2022-01-12 ENCOUNTER — Telehealth: Payer: Self-pay | Admitting: Internal Medicine

## 2022-01-12 DIAGNOSIS — D571 Sickle-cell disease without crisis: Secondary | ICD-10-CM

## 2022-01-12 DIAGNOSIS — G894 Chronic pain syndrome: Secondary | ICD-10-CM

## 2022-01-12 MED ORDER — MORPHINE SULFATE ER 15 MG PO TBCR: 15.0000 mg | EXTENDED_RELEASE_TABLET | Freq: Two times a day (BID) | ORAL | 0 refills | Status: DC

## 2022-01-12 NOTE — Telephone Encounter (Signed)
Morphine refill request.

## 2022-01-12 NOTE — Progress Notes (Signed)
Reviewed PDMP substance reporting system prior to prescribing opiate medications. No inconsistencies noted.  Meds ordered this encounter  Medications   morphine (MS CONTIN) 15 MG 12 hr tablet    Sig: Take 1 tablet (15 mg total) by mouth every 12 (twelve) hours.    Dispense:  60 tablet    Refill:  0    Order Specific Question:   Supervising Provider    Answer:   JEGEDE, OLUGBEMIGA E [[id number]]   Timothy Chavero Moore Rolando Hessling  APRN, MSN, FNP-C [facility] [address]  [place], [place] [address] [phone]  

## 2022-01-13 ENCOUNTER — Other Ambulatory Visit: Payer: Self-pay

## 2022-01-13 ENCOUNTER — Emergency Department (HOSPITAL_BASED_OUTPATIENT_CLINIC_OR_DEPARTMENT_OTHER)
Admission: EM | Admit: 2022-01-13 | Discharge: 2022-01-14 | Disposition: A | Discharge status: Home / Self Care | Payer: 59 | Attending: Emergency Medicine | Admitting: Emergency Medicine

## 2022-01-13 ENCOUNTER — Emergency Department (HOSPITAL_BASED_OUTPATIENT_CLINIC_OR_DEPARTMENT_OTHER): Payer: 59 | Admitting: Radiology

## 2022-01-13 ENCOUNTER — Encounter (HOSPITAL_BASED_OUTPATIENT_CLINIC_OR_DEPARTMENT_OTHER): Payer: Self-pay

## 2022-01-13 ENCOUNTER — Emergency Department (HOSPITAL_BASED_OUTPATIENT_CLINIC_OR_DEPARTMENT_OTHER): Payer: 59

## 2022-01-13 DIAGNOSIS — Z7901 Long term (current) use of anticoagulants: Secondary | ICD-10-CM | POA: Diagnosis not present

## 2022-01-13 DIAGNOSIS — D57219 Sickle-cell/Hb-C disease with crisis, unspecified: Secondary | ICD-10-CM | POA: Insufficient documentation

## 2022-01-13 DIAGNOSIS — D5700 Hb-SS disease with crisis, unspecified: Secondary | ICD-10-CM

## 2022-01-13 LAB — CBC WITH DIFFERENTIAL/PLATELET
Abs Immature Granulocytes: 0.04 10*3/uL (ref 0.00–0.07)
Basophils Absolute: 0 10*3/uL (ref 0.0–0.1)
Basophils Relative: 0 %
Eosinophils Absolute: 0.4 10*3/uL (ref 0.0–0.5)
Eosinophils Relative: 3 %
HCT: 28.3 % — ABNORMAL LOW (ref 39.0–52.0)
Hemoglobin: 9.6 g/dL — ABNORMAL LOW (ref 13.0–17.0)
Immature Granulocytes: 0 %
Lymphocytes Relative: 32 %
Lymphs Abs: 3.9 10*3/uL (ref 0.7–4.0)
MCH: 29.5 pg (ref 26.0–34.0)
MCHC: 33.9 g/dL (ref 30.0–36.0)
MCV: 87.1 fL (ref 80.0–100.0)
Monocytes Absolute: 1.2 10*3/uL — ABNORMAL HIGH (ref 0.1–1.0)
Monocytes Relative: 10 %
Neutro Abs: 6.5 10*3/uL (ref 1.7–7.7)
Neutrophils Relative %: 55 %
Platelets: 602 10*3/uL — ABNORMAL HIGH (ref 150–400)
RBC: 3.25 MIL/uL — ABNORMAL LOW (ref 4.22–5.81)
RDW: 21 % — ABNORMAL HIGH (ref 11.5–15.5)
WBC: 12.1 10*3/uL — ABNORMAL HIGH (ref 4.0–10.5)
nRBC: 2.9 % — ABNORMAL HIGH (ref 0.0–0.2)

## 2022-01-13 LAB — BASIC METABOLIC PANEL
Anion gap: 7 (ref 5–15)
BUN: 7 mg/dL (ref 6–20)
CO2: 27 mmol/L (ref 22–32)
Calcium: 9.1 mg/dL (ref 8.9–10.3)
Chloride: 106 mmol/L (ref 98–111)
Creatinine, Ser: 0.59 mg/dL — ABNORMAL LOW (ref 0.61–1.24)
GFR, Estimated: >60 mL/min (ref >60)
Glucose, Bld: 87 mg/dL (ref 70–99)
Potassium: 3.5 mmol/L (ref 3.5–5.1)
Sodium: 140 mmol/L (ref 135–145)

## 2022-01-13 LAB — TROPONIN I (HIGH SENSITIVITY): Troponin I (High Sensitivity): 5 ng/L (ref <18)

## 2022-01-13 MED ORDER — IOHEXOL 350 MG/ML SOLN
100.0000 mL | Freq: Once | INTRAVENOUS | Status: AC | PRN
  Administered 2022-01-13: 60 mL via INTRAVENOUS

## 2022-01-13 MED ORDER — HYDROMORPHONE HCL 1 MG/ML IJ SOLN
2.0000 mg | Freq: Once | INTRAMUSCULAR | Status: AC
  Administered 2022-01-13: 2 mg via INTRAVENOUS
  Filled 2022-01-13: qty 2

## 2022-01-13 MED ORDER — HYDROMORPHONE HCL 1 MG/ML IJ SOLN: 1.0000 mg | Freq: Once | INTRAMUSCULAR | Status: DC | PRN

## 2022-01-13 MED ORDER — HYDROMORPHONE HCL 1 MG/ML IJ SOLN
2.0000 mg | Freq: Once | INTRAMUSCULAR | Status: AC | PRN
  Administered 2022-01-13: 2 mg via INTRAVENOUS
  Filled 2022-01-13: qty 2

## 2022-01-13 MED ORDER — KETAMINE HCL 10 MG/ML IJ SOLN: 0.1000 mg/kg | Freq: Once | INTRAMUSCULAR | Status: DC

## 2022-01-13 NOTE — ED Provider Notes (Signed)
[place] EMERGENCY DEPT Provider Note   CSN: [id number] Arrival date & time: 01/13/22  1842     History  Chief Complaint  Patient presents with   Sickle Cell Pain Crisis    Timothy Marks is a 31 y.o. male.  With PMH of sickle cell anemia, depression, DVT on Xarelto who is presenting with low back pain consistent with sickle cell crisis pain as well as left-sided nonradiating chest pain worse with inspiration.  He typically takes Percocet and oral morphine for pain control but just took his last dose of oral morphine.  He has had no fevers, no nausea, no vomiting, no abdominal pain, no diarrhea, no cough or URI symptoms.  The left-sided chest pain is nonradiating and worse with deep inspiration.  He is compliant with his Xarelto for previous DVT.  Denies any leg pain or swelling.  No focal weakness numbness or tingling.  Complaining of some also low back pain but denies any recent traumatic injury.  No loss of bladder or bowels no saddle anesthesia.   Sickle Cell Pain Crisis      Home Medications Prior to Admission medications   Medication Sig Start Date End Date Taking? Authorizing Provider  cholecalciferol (VITAMIN D) 25 MCG (1000 UNIT) tablet Take 1,000 Units by mouth daily.    [provider]  folic acid (FOLVITE) 1 MG tablet Take 1 tablet (1 mg total) by mouth daily. 07/05/21   Dorena Dew, FNP  gabapentin (NEURONTIN) 300 MG capsule TAKE 1 CAPSULE(300 MG) BY MOUTH THREE TIMES DAILY 08/24/21   Tresa Garter, MD  hydrocortisone 1 % ointment Apply 1 Application topically 2 (two) times daily. 10/14/21   Tresa Garter, MD  hydroxyurea (HYDREA) 500 MG capsule Take 1 capsule (500 mg total) by mouth 2 (two) times daily. May take with food to minimize GI side effects. 12/21/21   Dorena Dew, FNP  hydrOXYzine (ATARAX) 25 MG tablet Take 25 mg by mouth daily as needed for anxiety or vomiting. 05/19/21   [provider]  mirtazapine  (REMERON) 7.5 MG tablet Take 7.5 mg by mouth at bedtime as needed (for sleep). 05/19/21   [provider]  morphine (MS CONTIN) 15 MG 12 hr tablet Take 1 tablet (15 mg total) by mouth every 12 (twelve) hours. 01/15/22   Dorena Dew, FNP  OLANZapine (ZYPREXA) 5 MG tablet Take 1 tablet (5 mg total) by mouth at bedtime. 05/07/21   Dorena Dew, FNP  oxyCODONE-acetaminophen (PERCOCET) 10-325 MG tablet Take 1 tablet by mouth every 6 (six) hours as needed for pain. 01/05/22   Dorena Dew, FNP  rivaroxaban (XARELTO) 20 MG TABS tablet Take 1 tablet (20 mg total) by mouth daily with supper. 07/05/21   Dorena Dew, FNP      Allergies    Buprenorphine hcl, Levaquin [levofloxacin], Meperidine, Morphine, Morphine and related, Tramadol, Vancomycin, and Zosyn [piperacillin sod-tazobactam so]    Review of Systems   Review of Systems  Physical Exam Updated Vital Signs BP 129/77   Pulse 71   Temp 98.8 F (37.1 C) (Oral)   Resp 18   Ht '6\' 2"'$  (1.88 m)   Wt 56.7 kg   SpO2 100%   BMI 16.05 kg/m  Physical Exam Constitutional: Alert and oriented.  No acute distress and nontoxic Eyes: PERRL ENT      Head: Normocephalic and atraumatic.      Nose: No congestion.      Mouth/Throat: Mucous membranes are  moist.      Neck: No stridor. Cardiovascular: S1, S2,  Normal and symmetric distal pulses are present in all extremities.Warm and well perfused. Respiratory: Normal respiratory effort. Breath sounds are normal.  O2 sat 100 on RA. Gastrointestinal: Soft and nontender.  Musculoskeletal: Normal range of motion in all extremities.  Lower lumbar spinal tenderness to palpation, no deformities, no step-offs.      Right lower leg: No tenderness or edema.      Left lower leg: No tenderness or edema. Neurologic: Normal speech and language.  No facial droop.  Equal strength bilateral upper and lower extremities.  Sensation grossly intact.  Steady ambulatory gait.  No gross focal neurologic  deficits are appreciated. Skin: Skin is warm, dry and intact. No rash noted. Psychiatric: Mood and affect are normal. Speech and behavior are normal.  ED Results / Procedures / Treatments   Labs (all labs ordered are listed, but only abnormal results are displayed) Labs Reviewed  BASIC METABOLIC PANEL - Abnormal; Notable for the following components:      Result Value   Creatinine, Ser 0.59 (*)    All other components within normal limits  CBC WITH DIFFERENTIAL/PLATELET - Abnormal; Notable for the following components:   WBC 12.1 (*)    RBC 3.25 (*)    Hemoglobin 9.6 (*)    HCT 28.3 (*)    RDW 21.0 (*)    Platelets 602 (*)    nRBC 2.9 (*)    Monocytes Absolute 1.2 (*)    All other components within normal limits  RETICULOCYTES - Abnormal; Notable for the following components:   Retic Ct Pct 9.0 (*)    RBC. 3.15 (*)    Retic Count, Absolute 282.9 (*)    Immature Retic Fract 37.5 (*)    All other components within normal limits  TROPONIN I (HIGH SENSITIVITY)    EKG EKG Interpretation  Date/Time:  Thursday January 13 2022 19:41:39 EST Ventricular Rate:  72 PR Interval:  130 QRS Duration: 100 QT Interval:  370 QTC Calculation: 405 R Axis:   83 Text Interpretation: Sinus rhythm with occasional Premature ventricular complexes Otherwise normal ECG When compared with ECG of 28-Nov-2021 11:52, Premature ventricular complexes are now Present New intermittent PVC Confirmed by Georgina Snell [phone]) on 01/13/2022 9:43:41 PM  Radiology CT Angio Chest PE W and/or Wo Contrast  Result Date: 01/13/2022 CLINICAL DATA:  Chest pain. EXAM: CT ANGIOGRAPHY CHEST WITH CONTRAST TECHNIQUE: Multidetector CT imaging of the chest was performed using the standard protocol during bolus administration of intravenous contrast. Multiplanar CT image reconstructions and MIPs were obtained to evaluate the vascular anatomy. RADIATION DOSE REDUCTION: This exam was performed according to the departmental  dose-optimization program which includes automated exposure control, adjustment of the mA and/or kV according to patient size and/or use of iterative reconstruction technique. CONTRAST:  42m OMNIPAQUE IOHEXOL 350 MG/ML SOLN COMPARISON:  January 26, 2021 FINDINGS: Cardiovascular: Satisfactory opacification of the pulmonary arteries to the segmental level. No evidence of pulmonary embolism. Normal heart size. No pericardial effusion. Mediastinum/Nodes: No enlarged mediastinal, hilar, or axillary lymph nodes. Thyroid gland, trachea, and esophagus demonstrate no significant findings. Lungs/Pleura: Mild atelectasis is seen along the periphery of the bilateral lung bases. There is no evidence of a pleural effusion or pneumothorax. Upper Abdomen: Surgical clips are seen within the gallbladder fossa. A small, densely calcified spleen is noted. Musculoskeletal: Diffusely sclerotic osseous structures are again seen as a result of the patient is known sickle cell disease. Review  of the MIP images confirms the above findings. IMPRESSION: 1. No evidence of pulmonary embolism or other acute intrathoracic process. 2. Mild bibasilar atelectasis. 3. Evidence of prior cholecystectomy. Electronically Signed   By: Virgina Norfolk M.D.   On: 01/13/2022 23:59   DG Chest 2 View  Result Date: 01/13/2022 CLINICAL DATA:  chest pain, sickle cell EXAM: CHEST - 2 VIEW COMPARISON:  Chest x-ray 11/28/2021, CT chest 01/26/2021 FINDINGS: The heart and mediastinal contours are within normal limits. No focal consolidation. No pulmonary edema. No pleural effusion. No pneumothorax. No acute osseous abnormality. Similar-appearing sclerotic density overlying the right proximal humerus. IMPRESSION: No active cardiopulmonary disease. Electronically Signed   By: Iven Finn M.D.   On: 01/13/2022 22:11    Procedures Procedures    Medications Ordered in ED Medications  HYDROmorphone (DILAUDID) injection 2 mg (2 mg Intravenous Given  01/13/22 2224)  HYDROmorphone (DILAUDID) injection 2 mg (2 mg Intravenous Given 01/13/22 2252)  iohexol (OMNIPAQUE) 350 MG/ML injection 100 mL (60 mLs Intravenous Contrast Given 01/13/22 2345)  HYDROmorphone (DILAUDID) injection 2 mg (2 mg Intravenous Given 01/13/22 2357)    ED Course/ Medical Decision Making/ A&P                           Medical Decision Making Timothy Marks is a 31 y.o. male.  With PMH of sickle cell anemia, depression, DVT on Xarelto who is presenting with low back pain consistent with sickle cell crisis pain as well as left-sided nonradiating chest pain worse with inspiration.  Patient symptoms could be secondary to sickle cell pain crisis versus acute chest versus known chronic pain among multiple other etiologies.  Chest x-ray obtained which I personally reviewed showed no consolidation concerning for pneumonia, no pleural effusion, no pneumothorax.  EKG obtained which I personally reviewed showed sinus rhythm with occasional PVCs but no acute ST/T changes concerning for acute ischemia or ACS and reassuring hs trop 5 downtrended from baseline.  He had stable hemoglobin 9.6.  Platelets 602.  CTA PE study obtained due to history of DVT on Xarelto and endorsing pleuritic chest pain.  It was negative for PE or any other acute intrathoracic pathology.  Pain is improved after 3 pain doses.  I told him he needs to follow-up with his sickle cell pain clinic.  He is safe for discharge.  Amount and/or Complexity of Data Reviewed Labs: ordered. Radiology: ordered.  Risk Prescription drug management.    Final Clinical Impression(s) / ED Diagnoses Final diagnoses:  Sickle cell pain crisis [facility])    Rx / DC Orders ED Discharge Orders     None         Elgie Congo, MD 01/14/22 0004

## 2022-01-13 NOTE — ED Triage Notes (Signed)
Lower back and chest pain since this morning. Describe as like his normal sickle cell pain.

## 2022-01-13 NOTE — Discharge Instructions (Signed)
You were seen for sickle cell pain.  Your workup was reassuring including your labs and imaging.  You are not having acute chest.  Go to the sickle cell clinic tomorrow to follow-up regarding your chronic pain.

## 2022-01-14 LAB — RETICULOCYTES
Immature Retic Fract: 37.5 % — ABNORMAL HIGH (ref 2.3–15.9)
RBC.: 3.15 MIL/uL — ABNORMAL LOW (ref 4.22–5.81)
Retic Count, Absolute: 282.9 10*3/uL — ABNORMAL HIGH (ref 19.0–186.0)
Retic Ct Pct: 9 % — ABNORMAL HIGH (ref 0.4–3.1)

## 2022-01-15 NOTE — Progress Notes (Signed)
Virtual Visit via Video Note  I connected with Timothy Marks on 01/15/22 at  1:40 PM EST by a video enabled telemedicine application and verified that I am speaking with the correct person using two identifiers.  Location: Patient: Home Provider: [place]   I discussed the limitations of evaluation and management by telemedicine and the availability of in person appointments. The patient expressed understanding and agreed to proceed.  History of Present Illness:  Timothy Marks is a 31 year old male with a medical history significant for sickle cell disease, chronic pain syndrome, opiate dependence and tolerance, anemia of chronic disease, and oppositional defiance disorder presents via video for follow-up of sickle cell disease and medication management. Patient continues to have chronic pain associated with his sickle cell disease.  Pain is primarily to his lower back.  His pain has been mostly controlled with Percocet and MS Contin.  Patient typically takes these medications daily to improve functionality.  Currently, his pain intensity is 6/10.  He last had Percocet this a.m. with moderate relief.  He says that he takes MS Contin every 12 hours.  He denies any headache, chest pain, dizziness, urinary symptoms, nausea, vomiting, or diarrhea.  Patient currently does not take disease modifying agents consistently.  He is on hydroxyurea and folic acid. 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   Sickle cell anemia (HCC)    Vitamin D deficiency y-6    Social History   Socioeconomic History   Marital status: Single    Spouse name: Not on file   Number of children: Not on file   Years of education: Not on file   Highest education level: Not on file  Occupational History   Not on file  Tobacco Use   Smoking status: Former     Types: Cigarettes   Smokeless tobacco: Never  Vaping Use   Vaping Use: Never used  Substance and Sexual Activity   Alcohol use: No   Drug use: Yes    Frequency: 7.0 times per week    Types: Marijuana   Sexual activity: Yes  Other Topics Concern   Not on file  Social History Narrative   Not on file   Social Determinants of Health   Financial Resource Strain: Not on file  Food Insecurity: No Food Insecurity (06/28/2021)   Hunger Vital Sign    Worried About Running Out of Food in the Last Year: Never true    Ran Out of Food in the Last Year: Never true  Transportation Needs: No Transportation Needs (06/28/2021)   PRAPARE - Hydrologist (Medical): No    Lack of Transportation (Non-Medical): No  Physical Activity: Not on file  Stress: Not on file  Social Connections: Not on file  Intimate Partner Violence: Not on file   Immunization History  Administered Date(s) Administered   DTaP 12/08/1988, 02/02/1989, 04/03/1989, 03/19/1990, 07/09/1993   H1N1 04/03/2008   HIB (PRP-OMP) 04/03/1989, 05/29/1989, 07/24/1989, 01/06/1990, 12/20/2007   HIB (PRP-T) 12/20/2007   Hepatitis A 10/05/1999   Hepatitis A, Ped/Adol-2 Dose 10/05/1999   Hepatitis B 07/28/1994, 09/02/1994, 08/19/1999   Hepatitis B, PED/ADOLESCENT 07/28/1994, 09/02/1994, 08/19/1999   IPV 12/08/1988, 02/02/1989, 03/19/1990, 07/09/1993   Influenza Split 01/20/2016, 11/30/2017   Influenza,inj,Quad PF,6+ Mos 03/04/2013, 04/01/2014, 01/20/2016, 11/30/2017, 02/27/2020, 04/22/2021   Influenza-Unspecified 10/16/2014   MMR  01/06/1990, 07/09/1993   Meningococcal Conjugate 12/20/2007, 01/20/2016   Pneumococcal Conjugate-13 10/07/1991, 01/14/2015   Pneumococcal Polysaccharide-23 09/18/2008, 03/04/2013   Pneumococcal-Unspecified 01/14/2015   Tdap 11/30/2017    Allergies  Allergen Reactions   Buprenorphine Hcl Hives   Levaquin [Levofloxacin] Itching   Meperidine Rash   Morphine Hives   Morphine And  Related Hives   Tramadol Hives   Vancomycin Itching   Zosyn [Piperacillin Sod-Tazobactam So] Itching, Rash and Other (See Comments)    Has taken rocephin in the past    Past Surgical History:  Procedure Laterality Date   CHOLECYSTECTOMY        Observations/Objective:   Assessment and Plan:  1. Chronic pain syndrome Reviewed PDMP substance reporting system prior to prescribing opiate medications. No inconsistencies noted.  Reviewed previous urine drug screen. Pain medication discussed with patient at length.  No changes w wanted on today.  2. Hb-SS disease without crisis [facility]) Discussed the importance of taking disease modifying agents consistently in order to achieve positive outcomes.  Patient expressed understanding.  Will send a new prescription for hydroxyurea to his pharmacy. - hydroxyurea (HYDREA) 500 MG capsule; Take 1 capsule (500 mg total) by mouth 2 (two) times daily. May take with food to minimize GI side effects.  Dispense: 180 capsule; Refill: 2  Follow Up Instructions:    I discussed the assessment and treatment plan with the patient. The patient was provided an opportunity to ask questions and all were answered. The patient agreed with the plan and demonstrated an understanding of the instructions.   The patient was advised to call back or seek an in-person evaluation if the symptoms worsen or if the condition fails to improve as anticipated.  I provided 12 minutes of non-face-to-face time during this encounter.  [name]  APRN, MSN, FNP-C Patient Timothy Marks [address] [place], [place] [address] [phone]

## 2022-01-17 ENCOUNTER — Other Ambulatory Visit: Payer: Self-pay | Admitting: Family Medicine

## 2022-01-17 DIAGNOSIS — G894 Chronic pain syndrome: Secondary | ICD-10-CM

## 2022-01-18 ENCOUNTER — Other Ambulatory Visit (HOSPITAL_COMMUNITY): Payer: Self-pay

## 2022-01-18 MED ORDER — OXYCODONE-ACETAMINOPHEN 10-325 MG PO TABS
1.0000 | ORAL_TABLET | Freq: Four times a day (QID) | ORAL | 0 refills | Status: DC | PRN
  Filled 2022-01-20: qty 60, 15d supply, fill #0

## 2022-01-18 NOTE — Telephone Encounter (Signed)
[facility] is requesting to fill pt oxycodone. Please advise [facility]

## 2022-01-20 ENCOUNTER — Other Ambulatory Visit (HOSPITAL_COMMUNITY): Payer: Self-pay

## 2022-01-24 ENCOUNTER — Emergency Department (HOSPITAL_BASED_OUTPATIENT_CLINIC_OR_DEPARTMENT_OTHER): Payer: 59 | Admitting: Radiology

## 2022-01-24 ENCOUNTER — Encounter (HOSPITAL_BASED_OUTPATIENT_CLINIC_OR_DEPARTMENT_OTHER): Payer: Self-pay | Admitting: Emergency Medicine

## 2022-01-24 ENCOUNTER — Other Ambulatory Visit: Payer: Self-pay

## 2022-01-24 ENCOUNTER — Emergency Department (HOSPITAL_BASED_OUTPATIENT_CLINIC_OR_DEPARTMENT_OTHER)
Admission: EM | Admit: 2022-01-24 | Discharge: 2022-01-24 | Disposition: A | Discharge status: Home / Self Care | Payer: 59 | Attending: Emergency Medicine | Admitting: Emergency Medicine

## 2022-01-24 DIAGNOSIS — G894 Chronic pain syndrome: Secondary | ICD-10-CM | POA: Insufficient documentation

## 2022-01-24 DIAGNOSIS — Z7901 Long term (current) use of anticoagulants: Secondary | ICD-10-CM | POA: Insufficient documentation

## 2022-01-24 DIAGNOSIS — D5700 Hb-SS disease with crisis, unspecified: Secondary | ICD-10-CM | POA: Diagnosis present

## 2022-01-24 LAB — CBC WITH DIFFERENTIAL/PLATELET
Abs Immature Granulocytes: 0.05 10*3/uL (ref 0.00–0.07)
Basophils Absolute: 0 10*3/uL (ref 0.0–0.1)
Basophils Relative: 0 %
Eosinophils Absolute: 0.2 10*3/uL (ref 0.0–0.5)
Eosinophils Relative: 2 %
HCT: 26.5 % — ABNORMAL LOW (ref 39.0–52.0)
Hemoglobin: 9.5 g/dL — ABNORMAL LOW (ref 13.0–17.0)
Immature Granulocytes: 0 %
Lymphocytes Relative: 25 %
Lymphs Abs: 3.5 10*3/uL (ref 0.7–4.0)
MCH: 30.4 pg (ref 26.0–34.0)
MCHC: 35.8 g/dL (ref 30.0–36.0)
MCV: 84.9 fL (ref 80.0–100.0)
Monocytes Absolute: 1.3 10*3/uL — ABNORMAL HIGH (ref 0.1–1.0)
Monocytes Relative: 9 %
Neutro Abs: 9.1 10*3/uL — ABNORMAL HIGH (ref 1.7–7.7)
Neutrophils Relative %: 64 %
Platelets: 573 10*3/uL — ABNORMAL HIGH (ref 150–400)
RBC: 3.12 MIL/uL — ABNORMAL LOW (ref 4.22–5.81)
RDW: 20.1 % — ABNORMAL HIGH (ref 11.5–15.5)
WBC: 14.3 10*3/uL — ABNORMAL HIGH (ref 4.0–10.5)
nRBC: 1 % — ABNORMAL HIGH (ref 0.0–0.2)

## 2022-01-24 LAB — COMPREHENSIVE METABOLIC PANEL
ALT: 12 U/L (ref 0–44)
AST: 24 U/L (ref 15–41)
Albumin: 4.9 g/dL (ref 3.5–5.0)
Alkaline Phosphatase: 79 U/L (ref 38–126)
Anion gap: 10 (ref 5–15)
BUN: 9 mg/dL (ref 6–20)
CO2: 27 mmol/L (ref 22–32)
Calcium: 9.5 mg/dL (ref 8.9–10.3)
Chloride: 100 mmol/L (ref 98–111)
Creatinine, Ser: 0.51 mg/dL — ABNORMAL LOW (ref 0.61–1.24)
GFR, Estimated: >60 mL/min (ref >60)
Glucose, Bld: 104 mg/dL — ABNORMAL HIGH (ref 70–99)
Potassium: 3.7 mmol/L (ref 3.5–5.1)
Sodium: 137 mmol/L (ref 135–145)
Total Bilirubin: 3.5 mg/dL — ABNORMAL HIGH (ref 0.3–1.2)
Total Protein: 8.3 g/dL — ABNORMAL HIGH (ref 6.5–8.1)

## 2022-01-24 LAB — RETICULOCYTES
Immature Retic Fract: 28.7 % — ABNORMAL HIGH (ref 2.3–15.9)
RBC.: 3.18 MIL/uL — ABNORMAL LOW (ref 4.22–5.81)
Retic Count, Absolute: 298.9 10*3/uL — ABNORMAL HIGH (ref 19.0–186.0)
Retic Ct Pct: 9.4 % — ABNORMAL HIGH (ref 0.4–3.1)

## 2022-01-24 MED ORDER — HYDROMORPHONE HCL 1 MG/ML IJ SOLN
2.0000 mg | Freq: Once | INTRAMUSCULAR | Status: AC
  Administered 2022-01-24: 2 mg via INTRAVENOUS
  Filled 2022-01-24: qty 2

## 2022-01-24 MED ORDER — HYDROMORPHONE HCL 1 MG/ML IJ SOLN
1.0000 mg | Freq: Once | INTRAMUSCULAR | Status: AC
  Administered 2022-01-24: 1 mg via INTRAVENOUS
  Filled 2022-01-24: qty 1

## 2022-01-24 NOTE — ED Triage Notes (Signed)
Patient reports he is hurting all over, and states he is having sickle cell pain.

## 2022-01-24 NOTE — ED Provider Notes (Signed)
[facility] EMERGENCY DEPT Provider Note   CSN: [id number] Arrival date & time: 01/24/22  1831     History  Chief Complaint  Patient presents with   Sickle Cell Pain Crisis    Timothy Marks is a 31 y.o. male with medical history of sickle cell anemia, depression, DVT on Xarelto.  Patient presents to ED for evaluation of sickle cell pain crisis.  Patient reports that beginning today he developed low back pain consistent with typical sickle cell pain crisis as well as left-sided chest pain that does not radiate that is worse with inspiration.  Patient reports he takes Percocet and oral morphine at home however these are not controlling pain.  Patient denies any fevers, nausea, vomiting, abdominal pain, diarrhea, URI symptoms.  Patient states compliance on Xarelto for previous DVT.  Patient denies any leg pain or swelling.  Patient denies any red flag symptoms for low back pain.  Patient reports of sickle cell pain crisis is typical of his past sickle cell pain crises.   Sickle Cell Pain Crisis Associated symptoms: chest pain   Associated symptoms: no fever, no nausea and no vomiting        Home Medications Prior to Admission medications   Medication Sig Start Date End Date Taking? Authorizing Provider  cholecalciferol (VITAMIN D) 25 MCG (1000 UNIT) tablet Take 1,000 Units by mouth daily.    [provider]  folic acid (FOLVITE) 1 MG tablet Take 1 tablet (1 mg total) by mouth daily. 07/05/21   Dorena Dew, FNP  gabapentin (NEURONTIN) 300 MG capsule TAKE 1 CAPSULE(300 MG) BY MOUTH THREE TIMES DAILY 08/24/21   Tresa Garter, MD  hydrocortisone 1 % ointment Apply 1 Application topically 2 (two) times daily. 10/14/21   Tresa Garter, MD  hydroxyurea (HYDREA) 500 MG capsule Take 1 capsule (500 mg total) by mouth 2 (two) times daily. May take with food to minimize GI side effects. 12/21/21   Dorena Dew, FNP  hydrOXYzine (ATARAX) 25 MG tablet  Take 25 mg by mouth daily as needed for anxiety or vomiting. 05/19/21   [provider]  mirtazapine (REMERON) 7.5 MG tablet Take 7.5 mg by mouth at bedtime as needed (for sleep). 05/19/21   [provider]  morphine (MS CONTIN) 15 MG 12 hr tablet Take 1 tablet (15 mg total) by mouth every 12 (twelve) hours. 01/15/22   Dorena Dew, FNP  OLANZapine (ZYPREXA) 5 MG tablet Take 1 tablet (5 mg total) by mouth at bedtime. 05/07/21   Dorena Dew, FNP  oxyCODONE-acetaminophen (PERCOCET) 10-325 MG tablet Take 1 tablet by mouth every 6 (six) hours as needed for pain. 01/20/22   Dorena Dew, FNP  rivaroxaban (XARELTO) 20 MG TABS tablet Take 1 tablet (20 mg total) by mouth daily with supper. 07/05/21   Dorena Dew, FNP      Allergies    Buprenorphine hcl, Levaquin [levofloxacin], Meperidine, Morphine, Morphine and related, Tramadol, Vancomycin, and Zosyn [piperacillin sod-tazobactam so]    Review of Systems   Review of Systems  Constitutional:  Negative for fever.  Cardiovascular:  Positive for chest pain. Negative for leg swelling.  Gastrointestinal:  Negative for abdominal pain, diarrhea, nausea and vomiting.  Musculoskeletal:  Positive for back pain.  All other systems reviewed and are negative.   Physical Exam Updated Vital Signs BP 112/76   Pulse 75   Temp 98.8 F (37.1 C)   Resp 14   Ht '6\' 2"'$  (1.88 m)  Wt 55.3 kg   SpO2 97%   BMI 15.66 kg/m  Physical Exam Vitals and nursing note reviewed.  Constitutional:      General: He is not in acute distress.    Appearance: Normal appearance. He is not ill-appearing, toxic-appearing or diaphoretic.  HENT:     Head: Normocephalic and atraumatic.     Nose: Nose normal. No congestion.     Mouth/Throat:     Mouth: Mucous membranes are moist.     Pharynx: Oropharynx is clear.  Eyes:     Extraocular Movements: Extraocular movements intact.     Conjunctiva/sclera: Conjunctivae normal.     Pupils: Pupils are  equal, round, and reactive to light.  Cardiovascular:     Rate and Rhythm: Normal rate and regular rhythm.  Pulmonary:     Effort: Pulmonary effort is normal.     Breath sounds: Normal breath sounds. No wheezing.  Abdominal:     General: Abdomen is flat. Bowel sounds are normal.     Palpations: Abdomen is soft.     Tenderness: There is no abdominal tenderness.  Musculoskeletal:     Cervical back: Normal range of motion and neck supple. No tenderness.  Skin:    General: Skin is warm and dry.     Capillary Refill: Capillary refill takes less than 2 seconds.  Neurological:     Mental Status: He is alert and oriented to person, place, and time.     ED Results / Procedures / Treatments   Labs (all labs ordered are listed, but only abnormal results are displayed) Labs Reviewed  COMPREHENSIVE METABOLIC PANEL - Abnormal; Notable for the following components:      Result Value   Glucose, Bld 104 (*)    Creatinine, Ser 0.51 (*)    Total Protein 8.3 (*)    Total Bilirubin 3.5 (*)    All other components within normal limits  CBC WITH DIFFERENTIAL/PLATELET - Abnormal; Notable for the following components:   WBC 14.3 (*)    RBC 3.12 (*)    Hemoglobin 9.5 (*)    HCT 26.5 (*)    RDW 20.1 (*)    Platelets 573 (*)    nRBC 1.0 (*)    Neutro Abs 9.1 (*)    Monocytes Absolute 1.3 (*)    All other components within normal limits  RETICULOCYTES - Abnormal; Notable for the following components:   Retic Ct Pct 9.4 (*)    RBC. 3.18 (*)    Retic Count, Absolute 298.9 (*)    Immature Retic Fract 28.7 (*)    All other components within normal limits    EKG None  Radiology DG Chest 2 View  Result Date: 01/24/2022 CLINICAL DATA:  Shortness of breath.  Sickle cell pain/crisis. EXAM: CHEST - 2 VIEW COMPARISON:  01/13/2022 FINDINGS: Diffuse bony sclerosis compatible with known sickle cell disease. The lungs appear clear. Cardiac and mediastinal margins appear normal. No blunting of the  costophrenic angles. IMPRESSION: 1. No acute findings. 2. Diffuse bony sclerosis compatible with known sickle cell disease. Electronically Signed   By: Van Clines M.D.   On: 01/24/2022 19:34    Procedures Procedures   Medications Ordered in ED Medications  HYDROmorphone (DILAUDID) injection 2 mg (2 mg Intravenous Given 01/24/22 2048)  HYDROmorphone (DILAUDID) injection 2 mg (2 mg Intravenous Given 01/24/22 2145)  HYDROmorphone (DILAUDID) injection 1 mg (1 mg Intravenous Given 01/24/22 2258)    ED Course/ Medical Decision Making/ A&P  Medical Decision Making Amount and/or Complexity of Data Reviewed Labs: ordered. Radiology: ordered.  Risk Prescription drug management.   31 year old male presents to the ED for evaluation.  Please see HPI for further details.  On examination the patient is afebrile and nontachycardic.  The patient sounds agree bilaterally, he is not hypoxic.  The patient abdomen is soft and compressible throughout.  Patient nontoxic in appearance.  The patient is on his cell phone as I enter the room.  Patient seen here very recently for the same complaint.  Patient complaining of typical sickle cell pain crisis.  Patient reports this is very typical of his past sickle cell pain crises.  Patient workup will include CBC, CMP, reticulocytes, chest x-ray.  Patient lab work at baseline.  Patient states compliance on Xarelto.  The patient recently had reassuring CT angio of the chest.  Patient reports after pain medication was administered that his chest pain has decreased and he feels no longer short of breath.  Patient provided with 2 mg of Dilaudid x 2, 1 mg of Dilaudid.  Patient reports after these were given that he feels better and is ready for discharge home.  Return precautions provided and the patient voiced understanding.  Patient advised to follow back up with sickle cell pain clinic.  Patient discharged home at this  time.  Final Clinical Impression(s) / ED Diagnoses Final diagnoses:  Chronic pain syndrome    Rx / DC Orders ED Discharge Orders     None         Azucena Cecil, PA-C 01/24/22 2325    Tretha Sciara, MD 01/26/22 [phone]

## 2022-01-24 NOTE — ED Notes (Signed)
Patient refuses to be stuck in triage, states he is a hard stick and understands there will be a wait, but wants to wait until he is in a room for blood work.

## 2022-01-24 NOTE — Discharge Instructions (Signed)
Please follow-up with sickle cell clinic Please return to ED with any new or worsening signs or symptoms Please read attached guide for chronic pain

## 2022-01-26 ENCOUNTER — Other Ambulatory Visit (HOSPITAL_COMMUNITY): Payer: Self-pay

## 2022-01-27 ENCOUNTER — Non-Acute Institutional Stay (HOSPITAL_COMMUNITY)
Admission: AD | Admit: 2022-01-27 | Discharge: 2022-01-27 | Disposition: A | Discharge status: Home / Self Care | Payer: 59 | Source: Ambulatory Visit | Attending: Internal Medicine | Admitting: Internal Medicine

## 2022-01-27 ENCOUNTER — Telehealth (HOSPITAL_COMMUNITY): Payer: Self-pay | Admitting: *Deleted

## 2022-01-27 DIAGNOSIS — F1120 Opioid dependence, uncomplicated: Secondary | ICD-10-CM | POA: Insufficient documentation

## 2022-01-27 DIAGNOSIS — D5700 Hb-SS disease with crisis, unspecified: Secondary | ICD-10-CM | POA: Diagnosis present

## 2022-01-27 DIAGNOSIS — G894 Chronic pain syndrome: Secondary | ICD-10-CM | POA: Diagnosis not present

## 2022-01-27 DIAGNOSIS — D638 Anemia in other chronic diseases classified elsewhere: Secondary | ICD-10-CM | POA: Insufficient documentation

## 2022-01-27 LAB — COMPREHENSIVE METABOLIC PANEL
ALT: 14 U/L (ref 0–44)
AST: 27 U/L (ref 15–41)
Albumin: 4 g/dL (ref 3.5–5.0)
Alkaline Phosphatase: 65 U/L (ref 38–126)
Anion gap: 8 (ref 5–15)
BUN: 5 mg/dL — ABNORMAL LOW (ref 6–20)
CO2: 27 mmol/L (ref 22–32)
Calcium: 9.3 mg/dL (ref 8.9–10.3)
Chloride: 103 mmol/L (ref 98–111)
Creatinine, Ser: 0.55 mg/dL — ABNORMAL LOW (ref 0.61–1.24)
GFR, Estimated: >60 mL/min (ref >60)
Glucose, Bld: 107 mg/dL — ABNORMAL HIGH (ref 70–99)
Potassium: 3.6 mmol/L (ref 3.5–5.1)
Sodium: 138 mmol/L (ref 135–145)
Total Bilirubin: 2.4 mg/dL — ABNORMAL HIGH (ref 0.3–1.2)
Total Protein: 7.6 g/dL (ref 6.5–8.1)

## 2022-01-27 LAB — CBC WITH DIFFERENTIAL/PLATELET
Abs Immature Granulocytes: 0.09 10*3/uL — ABNORMAL HIGH (ref 0.00–0.07)
Basophils Absolute: 0 10*3/uL (ref 0.0–0.1)
Basophils Relative: 0 %
Eosinophils Absolute: 0.2 10*3/uL (ref 0.0–0.5)
Eosinophils Relative: 2 %
HCT: 23.7 % — ABNORMAL LOW (ref 39.0–52.0)
Hemoglobin: 8.1 g/dL — ABNORMAL LOW (ref 13.0–17.0)
Immature Granulocytes: 1 %
Lymphocytes Relative: 18 %
Lymphs Abs: 1.8 10*3/uL (ref 0.7–4.0)
MCH: 29 pg (ref 26.0–34.0)
MCHC: 34.2 g/dL (ref 30.0–36.0)
MCV: 84.9 fL (ref 80.0–100.0)
Monocytes Absolute: 1.1 10*3/uL — ABNORMAL HIGH (ref 0.1–1.0)
Monocytes Relative: 11 %
Neutro Abs: 6.9 10*3/uL (ref 1.7–7.7)
Neutrophils Relative %: 68 %
Platelets: 573 10*3/uL — ABNORMAL HIGH (ref 150–400)
RBC: 2.79 MIL/uL — ABNORMAL LOW (ref 4.22–5.81)
RDW: 21.2 % — ABNORMAL HIGH (ref 11.5–15.5)
WBC: 10.1 10*3/uL (ref 4.0–10.5)
nRBC: 1.7 % — ABNORMAL HIGH (ref 0.0–0.2)

## 2022-01-27 LAB — RETICULOCYTES
Immature Retic Fract: 38.6 % — ABNORMAL HIGH (ref 2.3–15.9)
RBC.: 2.77 MIL/uL — ABNORMAL LOW (ref 4.22–5.81)
Retic Count, Absolute: 358.5 10*3/uL — ABNORMAL HIGH (ref 19.0–186.0)
Retic Ct Pct: 13 % — ABNORMAL HIGH (ref 0.4–3.1)

## 2022-01-27 MED ORDER — ACETAMINOPHEN 500 MG PO TABS
1000.0000 mg | ORAL_TABLET | Freq: Once | ORAL | Status: AC
  Administered 2022-01-27: 1000 mg via ORAL
  Filled 2022-01-27: qty 2

## 2022-01-27 MED ORDER — HYDROMORPHONE 1 MG/ML IV SOLN
INTRAVENOUS | Status: DC
  Administered 2022-01-27: 13 mg via INTRAVENOUS
  Administered 2022-01-27: 30 mg via INTRAVENOUS
  Filled 2022-01-27 (×2): qty 30

## 2022-01-27 MED ORDER — NALOXONE HCL 0.4 MG/ML IJ SOLN: 0.4000 mg | INTRAMUSCULAR | Status: DC | PRN

## 2022-01-27 MED ORDER — SODIUM CHLORIDE 0.9% FLUSH: 9.0000 mL | INTRAVENOUS | Status: DC | PRN

## 2022-01-27 MED ORDER — DIPHENHYDRAMINE HCL 25 MG PO CAPS
25.0000 mg | ORAL_CAPSULE | ORAL | Status: DC | PRN
  Administered 2022-01-27: 25 mg via ORAL
  Filled 2022-01-27: qty 1

## 2022-01-27 MED ORDER — SODIUM CHLORIDE 0.45 % IV SOLN: INTRAVENOUS | Status: DC

## 2022-01-27 MED ORDER — ONDANSETRON HCL 4 MG/2ML IJ SOLN: 4.0000 mg | Freq: Four times a day (QID) | INTRAMUSCULAR | Status: DC | PRN

## 2022-01-27 MED ORDER — KETOROLAC TROMETHAMINE 30 MG/ML IJ SOLN
15.0000 mg | Freq: Once | INTRAMUSCULAR | Status: AC
  Administered 2022-01-27: 15 mg via INTRAVENOUS
  Filled 2022-01-27: qty 1

## 2022-01-27 NOTE — H&P (Signed)
[facility] History and Physical   Date: 01/27/2022  Patient name: Timothy Marks Medical record number: [id number] Date of birth: [birth year] Age: 31 y.o. Gender: male PCP: Tresa Garter, MD  Attending physician: Tresa Garter, MD  Chief Complaint: Sickle cell pain  History of present illness:  Prynce Jacober is a 31 year old male with a medical history significant for sickle cell disease, chronic pain syndrome, opiate dependence and tolerance, and history of anemia of chronic disease presents with complaints of generalized pain that is consistent with previous sickle cell pain crisis. Trevelle says that pain intensity has been escalating over the past 2 days and unrelieved by his home medications. He last had percocet and MS Contin this am without sustained relief. Fatih rates his pain as 10/10, constant, and aching. Of note, he has new pain to left flank. Patient denies any fever, chills, chest pain, shortness of breath, or dizziness. No urinary symptoms, nausea, vomiting, or diarrhea. No sick contacts, recent travel, or known exposure to COVID-19.   Meds: Medications Prior to Admission  Medication Sig Dispense Refill Last Dose   cholecalciferol (VITAMIN D) 25 MCG (1000 UNIT) tablet Take 1,000 Units by mouth daily.      folic acid (FOLVITE) 1 MG tablet Take 1 tablet (1 mg total) by mouth daily. 90 tablet 2    gabapentin (NEURONTIN) 300 MG capsule TAKE 1 CAPSULE(300 MG) BY MOUTH THREE TIMES DAILY 90 capsule 5    hydrocortisone 1 % ointment Apply 1 Application topically 2 (two) times daily. 30 g 0    hydroxyurea (HYDREA) 500 MG capsule Take 1 capsule (500 mg total) by mouth 2 (two) times daily. May take with food to minimize GI side effects. 180 capsule 2    hydrOXYzine (ATARAX) 25 MG tablet Take 25 mg by mouth daily as needed for anxiety or vomiting.      mirtazapine (REMERON) 7.5 MG tablet Take 7.5 mg by mouth at bedtime as needed (for sleep).      morphine  (MS CONTIN) 15 MG 12 hr tablet Take 1 tablet (15 mg total) by mouth every 12 (twelve) hours. 60 tablet 0    OLANZapine (ZYPREXA) 5 MG tablet Take 1 tablet (5 mg total) by mouth at bedtime. 30 tablet 2    oxyCODONE-acetaminophen (PERCOCET) 10-325 MG tablet Take 1 tablet by mouth every 6 (six) hours as needed for pain. 60 tablet 0    rivaroxaban (XARELTO) 20 MG TABS tablet Take 1 tablet (20 mg total) by mouth daily with supper. 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   Sickle cell anemia (HCC)    Vitamin D deficiency y-6   Past Surgical History:  Procedure Laterality Date   CHOLECYSTECTOMY     Family History  Problem Relation Age of Onset   Sickle cell trait Mother    Sickle cell trait Father    Social History   Socioeconomic History   Marital status: Single    Spouse name: Not on file   Number of children: Not on file   Years of education: Not on file   Highest education level: Not on file  Occupational History   Not on file  Tobacco Use  Smoking status: Former    Types: Cigarettes   Smokeless tobacco: Never  Vaping Use   Vaping Use: Never used  Substance and Sexual Activity   Alcohol use: No   Drug use: Yes    Frequency: 7.0 times per week    Types: Marijuana   Sexual activity: Yes  Other Topics Concern   Not on file  Social History Narrative   Not on file   Social Determinants of Health   Financial Resource Strain: Not on file  Food Insecurity: No Food Insecurity (06/28/2021)   Hunger Vital Sign    Worried About Running Out of Food in the Last Year: Never true    Ran Out of Food in the Last Year: Never true  Transportation Needs: No  Transportation Needs (06/28/2021)   PRAPARE - Hydrologist (Medical): No    Lack of Transportation (Non-Medical): No  Physical Activity: Not on file  Stress: Not on file  Social Connections: Not on file  Intimate Partner Violence: Not on file   Review of Systems  Constitutional: Negative.  Negative for malaise/fatigue.  HENT: Negative.    Respiratory: Negative.    Cardiovascular: Negative.   Gastrointestinal: Negative.  Negative for nausea and vomiting.  Genitourinary:  Positive for flank pain.  Musculoskeletal:  Positive for back pain and joint pain.  Skin: Negative.   Neurological: Negative.   Psychiatric/Behavioral: Negative.       Physical Exam: There were no vitals taken for this visit. Physical Exam Constitutional:      Appearance: Normal appearance.  Eyes:     Pupils: Pupils are equal, round, and reactive to light.  Cardiovascular:     Rate and Rhythm: Normal rate and regular rhythm.     Pulses: Normal pulses.  Pulmonary:     Effort: Pulmonary effort is normal.  Abdominal:     General: Bowel sounds are normal.  Skin:    General: Skin is warm.  Neurological:     General: No focal deficit present.     Mental Status: He is alert. Mental status is at baseline.  Psychiatric:        Mood and Affect: Mood normal.        Behavior: Behavior normal.        Thought Content: Thought content normal.        Judgment: Judgment normal.      Lab results: No results found for this or any previous visit (from the past 24 hour(s)).  Imaging results:  No results found.   Assessment & Plan: Patient admitted to sickle cell day infusion center for management of pain crisis.  Patient is opiate naive Initiate IV dilaudid PCA. Settings of  IV fluids, 0.45% saline at 100 ml/hr Toradol 15 mg IV times one dose Tylenol 1000 mg by mouth times one dose Review CBC with differential, complete metabolic panel, and reticulocytes as results become  available.  Pain intensity will be reevaluated in context of functioning and relationship to baseline as care progresses If pain intensity remains elevated and/or sudden change in hemodynamic stability transition to inpatient services for higher level of care.     [name]  APRN, MSN, FNP-C Patient [facility] [address] [place], [place] [address] [phone]  01/27/2022, 9:13 AM

## 2022-01-27 NOTE — Telephone Encounter (Signed)
Patient called requesting to come to the day hospital for sickle cell pain management. Patient reports back and left leg pain rated 9/10. Reports taking Percocet 10 mg at 4:00 am and Morphine 15 mg at 7:00 am. COVID-19 screening done and patient denies all symptoms and exposures. Denies fever, chest pain, nausea, vomiting, diarrhea and abdominal pain. Patient's girlfriend will be his transportation. [place], [place] notified. Patient can come to the day hospital for pain management. Patient advised and expresses an understanding.

## 2022-01-27 NOTE — Discharge Summary (Signed)
[facility] Discharge Summary   Patient ID: Timothy Marks MRN: [id number] DOB/AGE: 03/18/1990 31 y.o.  Admit date: 01/27/2022 Discharge date: 01/27/2022  Primary Care Physician:  Tresa Garter, MD  Admission Diagnoses:  Principal Problem:   Sickle cell pain crisis [facility])   Discharge Medications:  Allergies as of 01/27/2022       Reactions   Buprenorphine Hcl Hives   Levaquin [levofloxacin] Itching   Meperidine Rash   Morphine Hives   Morphine And Related Hives   Tramadol Hives   Vancomycin Itching   Zosyn [piperacillin Sod-tazobactam So] Itching, Rash, Other (See Comments)   Has taken rocephin in the past        Medication List     TAKE these medications    cholecalciferol 25 MCG (1000 UNIT) tablet Commonly known as: VITAMIN D3 Take 1,000 Units by mouth daily.   folic acid 1 MG tablet Commonly known as: FOLVITE Take 1 tablet (1 mg total) by mouth daily.   gabapentin 300 MG capsule Commonly known as: NEURONTIN TAKE 1 CAPSULE(300 MG) BY MOUTH THREE TIMES DAILY   hydrocortisone 1 % ointment Apply 1 Application topically 2 (two) times daily.   hydroxyurea 500 MG capsule Commonly known as: HYDREA Take 1 capsule (500 mg total) by mouth 2 (two) times daily. May take with food to minimize GI side effects.   hydrOXYzine 25 MG tablet Commonly known as: ATARAX Take 25 mg by mouth daily as needed for anxiety or vomiting.   mirtazapine 7.5 MG tablet Commonly known as: REMERON Take 7.5 mg by mouth at bedtime as needed (for sleep).   morphine 15 MG 12 hr tablet Commonly known as: MS Contin Take 1 tablet (15 mg total) by mouth every 12 (twelve) hours.   OLANZapine 5 MG tablet Commonly known as: ZYPREXA Take 1 tablet (5 mg total) by mouth at bedtime.   oxyCODONE-acetaminophen 10-325 MG tablet Commonly known as: Percocet Take 1 tablet by mouth every 6 (six) hours as needed for pain.   rivaroxaban 20 MG Tabs tablet Commonly known as:  Xarelto Take 1 tablet (20 mg total) by mouth daily with supper.         Consults:  None  Significant Diagnostic Studies:  DG Chest 2 View  Result Date: 01/24/2022 CLINICAL DATA:  Shortness of breath.  Sickle cell pain/crisis. EXAM: CHEST - 2 VIEW COMPARISON:  01/13/2022 FINDINGS: Diffuse bony sclerosis compatible with known sickle cell disease. The lungs appear clear. Cardiac and mediastinal margins appear normal. No blunting of the costophrenic angles. IMPRESSION: 1. No acute findings. 2. Diffuse bony sclerosis compatible with known sickle cell disease. Electronically Signed   By: Van Clines M.D.   On: 01/24/2022 19:34   CT Angio Chest PE W and/or Wo Contrast  Result Date: 01/13/2022 CLINICAL DATA:  Chest pain. EXAM: CT ANGIOGRAPHY CHEST WITH CONTRAST TECHNIQUE: Multidetector CT imaging of the chest was performed using the standard protocol during bolus administration of intravenous contrast. Multiplanar CT image reconstructions and MIPs were obtained to evaluate the vascular anatomy. RADIATION DOSE REDUCTION: This exam was performed according to the departmental dose-optimization program which includes automated exposure control, adjustment of the mA and/or kV according to patient size and/or use of iterative reconstruction technique. CONTRAST:  83m OMNIPAQUE IOHEXOL 350 MG/ML SOLN COMPARISON:  January 26, 2021 FINDINGS: Cardiovascular: Satisfactory opacification of the pulmonary arteries to the segmental level. No evidence of pulmonary embolism. Normal heart size. No pericardial effusion. Mediastinum/Nodes: No enlarged mediastinal, hilar, or axillary lymph  nodes. Thyroid gland, trachea, and esophagus demonstrate no significant findings. Lungs/Pleura: Mild atelectasis is seen along the periphery of the bilateral lung bases. There is no evidence of a pleural effusion or pneumothorax. Upper Abdomen: Surgical clips are seen within the gallbladder fossa. A small, densely calcified spleen  is noted. Musculoskeletal: Diffusely sclerotic osseous structures are again seen as a result of the patient is known sickle cell disease. Review of the MIP images confirms the above findings. IMPRESSION: 1. No evidence of pulmonary embolism or other acute intrathoracic process. 2. Mild bibasilar atelectasis. 3. Evidence of prior cholecystectomy. Electronically Signed   By: Virgina Norfolk M.D.   On: 01/13/2022 23:59   DG Chest 2 View  Result Date: 01/13/2022 CLINICAL DATA:  chest pain, sickle cell EXAM: CHEST - 2 VIEW COMPARISON:  Chest x-ray 11/28/2021, CT chest 01/26/2021 FINDINGS: The heart and mediastinal contours are within normal limits. No focal consolidation. No pulmonary edema. No pleural effusion. No pneumothorax. No acute osseous abnormality. Similar-appearing sclerotic density overlying the right proximal humerus. IMPRESSION: No active cardiopulmonary disease. Electronically Signed   By: Iven Finn M.D.   On: 01/13/2022 22:11    History of present illness:  Timothy Marks is a 31 year old male with a medical history significant for sickle cell disease, chronic pain syndrome, opiate dependence and tolerance, and history of anemia of chronic disease presents with complaints of generalized pain that is consistent with previous sickle cell pain crisis. Kaesyn says that pain intensity has been escalating over the past 2 days and unrelieved by his home medications. He last had percocet and MS Contin this am without sustained relief. Jamin rates his pain as 10/10, constant, and aching. Of note, he has new pain to left flank. Patient denies any fever, chills, chest pain, shortness of breath, or dizziness. No urinary symptoms, nausea, vomiting, or diarrhea. No sick contacts, recent travel, or known exposure to COVID-19.   [facility] Course: Timothy Marks was admitted to sickle cell day infusion clinic for management of pain crisis. Reviewed all laboratory values, consistent  with patient's baseline. Patient's pain was managed with IV Dilaudid PCA Toradol 15 mg IV x 1 Tylenol 1000 mg x 1 IV fluids, 0.45% saline at 100 mL/h. Pain intensity decreased throughout admission and patient is requesting discharge home. He is alert, oriented, and ambulating without assistance.  He will discharge home in a hemodynamically stable condition.   Discharge Instructions:  Resume all home medications.   Follow up with PCP as previously  scheduled.   Discussed the importance of drinking 64 ounces of water daily, dehydration of red blood cells may lead further sickling.   Avoid all stressors that precipitate sickle cell pain crisis.     The patient was given clear instructions to go to ER or return to medical center if symptoms do not improve, worsen or new problems develop.   Physical Exam at Discharge:  BP 113/65 (BP Location: Right Arm)   Pulse 85   Temp 98.9 F (37.2 C) (Temporal)   Resp 16   SpO2 97%   Physical Exam Constitutional:      Appearance: Normal appearance.  Eyes:     Pupils: Pupils are equal, round, and reactive to light.  Cardiovascular:     Rate and Rhythm: Normal rate and regular rhythm.     Pulses: Normal pulses.  Abdominal:     General: Bowel sounds are normal.  Musculoskeletal:        General: Normal range of  motion.  Skin:    General: Skin is warm.  Neurological:     General: No focal deficit present.     Mental Status: He is alert. Mental status is at baseline.  Psychiatric:        Mood and Affect: Mood normal.        Behavior: Behavior normal.        Thought Content: Thought content normal.        Judgment: Judgment normal.       Disposition at Discharge: Discharge disposition: 01-Home or Self Care       Discharge Orders: Discharge Instructions     Discharge patient   Complete by: As directed    Discharge disposition: 01-Home or Self Care   Discharge patient date: 01/27/2022       Condition at Discharge:    Stable  Time spent on Discharge:  Greater than 30 minutes.  Signed: [name]  APRN, MSN, FNP-C Patient [facility] [address] [place], [place] [address] [phone]  01/27/2022, 3:33 PM

## 2022-01-27 NOTE — Progress Notes (Signed)
Patient admitted to day hospital today for sickle cell pain. On arrival, pt rated 9/10 generalized pain. Pt received Dilaudid PCA, 15 mg Toradol, and hydrated with IV fluids via PIV. Pt also received 1,000 mg Tylenol and 25 mg PO Benadryl. At discharge, pt rated 4/10 pain. AVS offered, but pt declined. Pt oriented, alert, and ambulatory at discharge.

## 2022-02-02 ENCOUNTER — Other Ambulatory Visit: Payer: Self-pay

## 2022-02-02 ENCOUNTER — Telehealth: Payer: Self-pay

## 2022-02-02 ENCOUNTER — Other Ambulatory Visit: Payer: Self-pay | Admitting: Family Medicine

## 2022-02-02 DIAGNOSIS — G894 Chronic pain syndrome: Secondary | ICD-10-CM

## 2022-02-02 MED ORDER — OXYCODONE-ACETAMINOPHEN 10-325 MG PO TABS: 1.0000 | ORAL_TABLET | Freq: Four times a day (QID) | ORAL | 0 refills | Status: DC | PRN

## 2022-02-02 NOTE — Progress Notes (Signed)
Reviewed PDMP substance reporting system prior to prescribing opiate medications. No inconsistencies noted.  Meds ordered this encounter  Medications   oxyCODONE-acetaminophen (PERCOCET) 10-325 MG tablet    Sig: Take 1 tablet by mouth every 6 (six) hours as needed for pain.    Dispense:  60 tablet    Refill:  0    Order Specific Question:   Supervising Provider    Answer:   Tresa Garter [[id number]]   [name]  APRN, MSN, FNP-C Patient [facility] [address] [place], [place] [address] [phone] a

## 2022-02-02 NOTE — Telephone Encounter (Signed)
Oxycodone/APAP prior auth submitted to [facility] Medicaid today confirmation #[id number]

## 2022-02-02 NOTE — Telephone Encounter (Signed)
Denied, submitted second request-confirmation #[id number] W

## 2022-02-03 NOTE — Telephone Encounter (Addendum)
Caller & Relationship to patient:  MRN #  [id number]   Call Back Number:   Date of Last Office Visit: 01/17/2022     Date of Next Office Visit: 03/22/2022    Medication(s) to be Refilled: Percocet  Preferred Pharmacy: [facility]  ** Please notify patient to allow 48-72 hours to process** **Let patient know to contact pharmacy at the end of the day to make sure medication is ready. ** **If patient has not been seen in a year or longer, book an appointment **Advise to use MyChart for refill requests OR to contact their pharmacy

## 2022-02-03 NOTE — Telephone Encounter (Signed)
Denied again. Patient has separate primary coverage. I will contact pharmacy to see if they are processing the correct ins and if needed request a prior auth with the primary insurance plan. The PA request received by me was for his secondary coverage plan.

## 2022-02-03 NOTE — Telephone Encounter (Signed)
Please advise KH 

## 2022-02-04 ENCOUNTER — Other Ambulatory Visit (HOSPITAL_COMMUNITY): Payer: Self-pay

## 2022-02-04 ENCOUNTER — Other Ambulatory Visit: Payer: Self-pay

## 2022-02-08 ENCOUNTER — Other Ambulatory Visit: Payer: Self-pay | Admitting: Internal Medicine

## 2022-02-08 ENCOUNTER — Telehealth: Payer: Self-pay | Admitting: Internal Medicine

## 2022-02-08 DIAGNOSIS — D571 Sickle-cell disease without crisis: Secondary | ICD-10-CM

## 2022-02-08 DIAGNOSIS — G894 Chronic pain syndrome: Secondary | ICD-10-CM

## 2022-02-08 NOTE — Telephone Encounter (Signed)
Caller & Relationship to patient:  MRN #  [id number]   Call Back Number: [phone]  Date of Last Office Visit: 02/02/2022     Date of Next Office Visit: 03/22/2022    Medication(s) to be Refilled: Morphine '15mg'$    ** Please notify patient to allow 48-72 hours to process** **Let patient know to contact pharmacy at the end of the day to make sure medication is ready. ** **If patient has not been seen in a year or longer, book an appointment **Advise to use MyChart for refill requests OR to contact their pharmacy

## 2022-02-15 ENCOUNTER — Other Ambulatory Visit: Payer: Self-pay | Admitting: Family Medicine

## 2022-02-15 DIAGNOSIS — D571 Sickle-cell disease without crisis: Secondary | ICD-10-CM

## 2022-02-15 DIAGNOSIS — G894 Chronic pain syndrome: Secondary | ICD-10-CM

## 2022-02-15 MED ORDER — MORPHINE SULFATE ER 15 MG PO TBCR: 15.0000 mg | EXTENDED_RELEASE_TABLET | Freq: Two times a day (BID) | ORAL | 0 refills | Status: DC

## 2022-02-15 MED ORDER — OXYCODONE-ACETAMINOPHEN 10-325 MG PO TABS: 1.0000 | ORAL_TABLET | Freq: Four times a day (QID) | ORAL | 0 refills | Status: DC | PRN

## 2022-02-15 NOTE — Progress Notes (Signed)
Reviewed PDMP substance reporting system prior to prescribing opiate medications. No inconsistencies noted.  Meds ordered this encounter  Medications   morphine (MS CONTIN) 15 MG 12 hr tablet    Sig: Take 1 tablet (15 mg total) by mouth every 12 (twelve) hours.    Dispense:  60 tablet    Refill:  0    Order Specific Question:   Supervising Provider    Answer:   Tresa Garter [[id number]]   oxyCODONE-acetaminophen (PERCOCET) 10-325 MG tablet    Sig: Take 1 tablet by mouth every 6 (six) hours as needed for pain.    Dispense:  60 tablet    Refill:  0    Order Specific Question:   Supervising Provider    Answer:   Tresa Garter [[id number]]   [name]  APRN, MSN, FNP-C Patient [facility] [address] [place], [place] [address] [phone]

## 2022-02-17 ENCOUNTER — Other Ambulatory Visit: Payer: Self-pay | Admitting: Internal Medicine

## 2022-03-01 ENCOUNTER — Telehealth: Payer: Self-pay | Admitting: Internal Medicine

## 2022-03-01 NOTE — Telephone Encounter (Signed)
Percocet refill request says due Friday or Saturday

## 2022-03-02 ENCOUNTER — Other Ambulatory Visit: Payer: Self-pay | Admitting: Family Medicine

## 2022-03-02 DIAGNOSIS — G894 Chronic pain syndrome: Secondary | ICD-10-CM

## 2022-03-02 MED ORDER — OXYCODONE-ACETAMINOPHEN 10-325 MG PO TABS: 1.0000 | ORAL_TABLET | Freq: Four times a day (QID) | ORAL | 0 refills | Status: DC | PRN

## 2022-03-02 NOTE — Progress Notes (Signed)
Reviewed PDMP substance reporting system prior to prescribing opiate medications. No inconsistencies noted.  Meds ordered this encounter  Medications   oxyCODONE-acetaminophen (PERCOCET) 10-325 MG tablet    Sig: Take 1 tablet by mouth every 6 (six) hours as needed for pain.    Dispense:  60 tablet    Refill:  0    Order Specific Question:   Supervising Provider    Answer:   JEGEDE, OLUGBEMIGA E [[id number]]   Sharah Finnell Moore Liyat Faulkenberry  APRN, MSN, FNP-C [facility] [address]  [place], [place] [address] [phone]  

## 2022-03-03 ENCOUNTER — Other Ambulatory Visit: Payer: Self-pay | Admitting: Internal Medicine

## 2022-03-14 ENCOUNTER — Other Ambulatory Visit: Payer: Self-pay | Admitting: Family Medicine

## 2022-03-14 DIAGNOSIS — D571 Sickle-cell disease without crisis: Secondary | ICD-10-CM

## 2022-03-14 DIAGNOSIS — G894 Chronic pain syndrome: Secondary | ICD-10-CM

## 2022-03-14 MED ORDER — OXYCODONE-ACETAMINOPHEN 10-325 MG PO TABS: 1.0000 | ORAL_TABLET | Freq: Four times a day (QID) | ORAL | 0 refills | Status: DC | PRN

## 2022-03-14 MED ORDER — MORPHINE SULFATE ER 15 MG PO TBCR: 15.0000 mg | EXTENDED_RELEASE_TABLET | Freq: Two times a day (BID) | ORAL | 0 refills | Status: DC

## 2022-03-14 NOTE — Progress Notes (Signed)
Reviewed PDMP substance reporting system prior to prescribing opiate medications. No inconsistencies noted.  Meds ordered this encounter  Medications   morphine (MS CONTIN) 15 MG 12 hr tablet    Sig: Take 1 tablet (15 mg total) by mouth every 12 (twelve) hours.    Dispense:  60 tablet    Refill:  0    Order Specific Question:   Supervising Provider    Answer:   Tresa Garter [[id number]]   oxyCODONE-acetaminophen (PERCOCET) 10-325 MG tablet    Sig: Take 1 tablet by mouth every 6 (six) hours as needed for pain.    Dispense:  60 tablet    Refill:  0    Order Specific Question:   Supervising Provider    Answer:   Tresa Garter [[id number]]   [name]  APRN, MSN, FNP-C Patient [facility] [address] [place], [place] [address] [phone]

## 2022-03-14 NOTE — Telephone Encounter (Signed)
Caller & Relationship to patient:  MRN #  [id number]   Call Back Number:   Date of Last Office Visit: 02/08/2022     Date of Next Office Visit: 03/01/2022    Medication(s) to be Refilled: Percocet and morphine  Preferred Pharmacy: [facility] on [address]  ** Please notify patient to allow 48-72 hours to process** **Let patient know to contact pharmacy at the end of the day to make sure medication is ready. ** **If patient has not been seen in a year or longer, book an appointment **Advise to use MyChart for refill requests OR to contact their pharmacy

## 2022-03-22 ENCOUNTER — Ambulatory Visit: Payer: Self-pay | Admitting: Family Medicine

## 2022-03-28 ENCOUNTER — Telehealth: Payer: Self-pay | Admitting: Internal Medicine

## 2022-03-28 NOTE — Telephone Encounter (Signed)
Caller & Relationship to patient:  MRN #  [id number]   Call Back Number:   Date of Last Office Visit: 03/01/2022     Date of Next Office Visit: 05/17/2022    Medication(s) to be Refilled: Percocet  Preferred Pharmacy: [facility] [address]  ** Please notify patient to allow 48-72 hours to process** **Let patient know to contact pharmacy at the end of the day to make sure medication is ready. ** **If patient has not been seen in a year or longer, book an appointment **Advise to use MyChart for refill requests OR to contact their pharmacy

## 2022-03-29 ENCOUNTER — Other Ambulatory Visit: Payer: Self-pay

## 2022-03-29 DIAGNOSIS — G894 Chronic pain syndrome: Secondary | ICD-10-CM

## 2022-03-29 MED ORDER — OXYCODONE-ACETAMINOPHEN 10-325 MG PO TABS: 1.0000 | ORAL_TABLET | Freq: Four times a day (QID) | ORAL | 0 refills | Status: DC | PRN

## 2022-03-29 NOTE — Telephone Encounter (Signed)
Caller & Relationship to patient:  MRN #  [id number]   Call Back Number:   Date of Last Office Visit: 03/28/2022     Date of Next Office Visit: 05/17/2022    Medication(s) to be Refilled: see below  Preferred Pharmacy:   ** Please notify patient to allow 48-72 hours to process** **Let patient know to contact pharmacy at the end of the day to make sure medication is ready. ** **If patient has not been seen in a year or longer, book an appointment **Advise to use MyChart for refill requests OR to contact their pharmacy

## 2022-03-29 NOTE — Telephone Encounter (Signed)
From: [name] To: Office of Cammie Sickle, [place] Sent: 03/28/2022 9:40 AM EST Subject: Medication Renewal Request  Refills have been requested for the following medications:   oxyCODONE-acetaminophen (PERCOCET) 10-325 MG tablet [Lachina Hollis]  Patient Comment: Its due today i dnt have none left need this today   Preferred pharmacy: [facility] [place], [place] Delivery method: [employer]

## 2022-04-12 ENCOUNTER — Telehealth: Payer: Self-pay | Admitting: Internal Medicine

## 2022-04-12 ENCOUNTER — Other Ambulatory Visit: Payer: Self-pay | Admitting: Family Medicine

## 2022-04-12 DIAGNOSIS — G894 Chronic pain syndrome: Secondary | ICD-10-CM

## 2022-04-12 DIAGNOSIS — D571 Sickle-cell disease without crisis: Secondary | ICD-10-CM

## 2022-04-12 MED ORDER — MORPHINE SULFATE ER 15 MG PO TBCR: 15.0000 mg | EXTENDED_RELEASE_TABLET | Freq: Two times a day (BID) | ORAL | 0 refills | Status: DC

## 2022-04-12 NOTE — Progress Notes (Signed)
Reviewed PDMP substance reporting system prior to prescribing opiate medications. No inconsistencies noted.  Meds ordered this encounter  Medications   morphine (MS CONTIN) 15 MG 12 hr tablet    Sig: Take 1 tablet (15 mg total) by mouth every 12 (twelve) hours.    Dispense:  60 tablet    Refill:  0    Order Specific Question:   Supervising Provider    Answer:   Tresa Garter [id number]   [name]  APRN, MSN, FNP-C Patient [facility] [address] [place], [place] [address] [phone]

## 2022-04-12 NOTE — Telephone Encounter (Signed)
Caller & Relationship to patient:  MRN #  [id number]   Call Back Number:   Date of Last Office Visit: 03/29/2022     Date of Next Office Visit: 05/17/2022    Medication(s) to be Refilled: Morphine  Preferred Pharmacy:   ** Please notify patient to allow 48-72 hours to process** **Let patient know to contact pharmacy at the end of the day to make sure medication is ready. ** **If patient has not been seen in a year or longer, book an appointment **Advise to use MyChart for refill requests OR to contact their pharmacy

## 2022-04-14 ENCOUNTER — Telehealth: Payer: Self-pay | Admitting: Internal Medicine

## 2022-04-14 ENCOUNTER — Other Ambulatory Visit: Payer: Self-pay | Admitting: Family Medicine

## 2022-04-14 ENCOUNTER — Other Ambulatory Visit: Payer: Self-pay

## 2022-04-14 DIAGNOSIS — G894 Chronic pain syndrome: Secondary | ICD-10-CM

## 2022-04-14 MED ORDER — OXYCODONE-ACETAMINOPHEN 10-325 MG PO TABS: 1.0000 | ORAL_TABLET | Freq: Four times a day (QID) | ORAL | 0 refills | Status: DC | PRN

## 2022-04-14 NOTE — Telephone Encounter (Signed)
Contacted Timothy Marks to schedule their annual wellness visit. Appointment made for 04/21/2022.  Patient returned my call and scheduled AWV  Thank you,  [place] Direct dial  [phone]

## 2022-04-14 NOTE — Progress Notes (Signed)
Reviewed PDMP substance reporting system prior to prescribing opiate medications. No inconsistencies noted.  Meds ordered this encounter  Medications   oxyCODONE-acetaminophen (PERCOCET) 10-325 MG tablet    Sig: Take 1 tablet by mouth every 6 (six) hours as needed for pain.    Dispense:  60 tablet    Refill:  0    Order Specific Question:   Supervising Provider    Answer:   Tresa Garter [id number]   [name]  APRN, MSN, FNP-C Patient [facility] [address] [place], [place] [address] [phone]

## 2022-04-14 NOTE — Telephone Encounter (Signed)
From: [name] To: Office of Cammie Sickle, [place] Sent: 04/14/2022 9:42 AM EST Subject: Medication Renewal Request  Refills have been requested for the following medications:   oxyCODONE-acetaminophen (PERCOCET) 10-325 MG tablet [Lachina Hollis]  Patient Comment: Need a refill thinks its due this week??  Preferred pharmacy: [facility] [place], [place] Delivery method: [employer]

## 2022-04-14 NOTE — Telephone Encounter (Signed)
Called patient to schedule Medicare Annual Wellness Visit (AWV). Left message for patient to call back and schedule Medicare Annual Wellness Visit (AWV).  Last date of AWV: AWVI eligible as of 06/14/2021  Please schedule an appointment at any time with [place], [facility].  If any questions, please contact me at [phone].   Thank you,  [place] Direct dial  [phone]

## 2022-04-14 NOTE — Telephone Encounter (Signed)
Caller & Relationship to patient:  MRN #  [id number]   Call Back Number:   Date of Last Office Visit: 04/14/2022     Date of Next Office Visit: 04/21/2022    Medication(s) to be Refilled: Percocet  Preferred Pharmacy:   ** Please notify patient to allow 48-72 hours to process** **Let patient know to contact pharmacy at the end of the day to make sure medication is ready. ** **If patient has not been seen in a year or longer, book an appointment **Advise to use MyChart for refill requests OR to contact their pharmacy

## 2022-04-14 NOTE — Telephone Encounter (Signed)
Please advise KH 

## 2022-04-21 ENCOUNTER — Ambulatory Visit (INDEPENDENT_AMBULATORY_CARE_PROVIDER_SITE_OTHER): Payer: 59

## 2022-04-21 VITALS — Ht 74.0 in | Wt 116.0 lb

## 2022-04-21 DIAGNOSIS — Z0000 Encounter for general adult medical examination without abnormal findings: Secondary | ICD-10-CM | POA: Diagnosis not present

## 2022-04-21 NOTE — Patient Instructions (Addendum)
Timothy Marks , Thank you for taking time to come for your Medicare Wellness Visit. I appreciate your ongoing commitment to your health goals. Please review the following plan we discussed and let me know if I can assist you in the future.   These are the goals we discussed:  Goals       Stay Healthy (pt-stated)      I want to stay out of the hospital.        This is a list of the screening recommended for you and due dates:  Health Maintenance  Topic Date Due   Meningitis B Vaccine (1 of 4 - Increased Risk) Never done   COVID-19 Vaccine (1) 01/07/2023*   Medicare Annual Wellness Visit  04/21/2023   DTaP/Tdap/Td vaccine (7 - Td or Tdap) 12/01/2027   Hepatitis C Screening: USPSTF Recommendation to screen - Ages 36-79 yo.  Completed   HIV Screening  Completed   HPV Vaccine  Aged Out  *Topic was postponed. The date shown is not the original due date.   Opioid Pain Medicine Management Opioids are powerful medicines that are used to treat moderate to severe pain. When used for short periods of time, they can help you to: Sleep better. Do better in physical or occupational therapy. Feel better in the first few days after an injury. Recover from surgery. Opioids should be taken with the supervision of a trained health care provider. They should be taken for the shortest period of time possible. This is because opioids can be addictive, and the longer you take opioids, the greater your risk of addiction. This addiction can also be called opioid use disorder. What are the risks? Using opioid pain medicines for longer than 3 days increases your risk of side effects. Side effects include: Constipation. Nausea and vomiting. Breathing difficulties (respiratory depression). Drowsiness. Confusion. Opioid use disorder. Itching. Taking opioid pain medicine for a long period of time can affect your ability to do daily tasks. It also puts you at risk for: Motor vehicle  crashes. Depression. Suicide. Heart attack. Overdose, which can be life-threatening. What is a pain treatment plan? A pain treatment plan is an agreement between you and your health care provider. Pain is unique to each person, and treatments vary depending on your condition. To manage your pain, you and your health care provider need to work together. To help you do this: Discuss the goals of your treatment, including how much pain you might expect to have and how you will manage the pain. Review the risks and benefits of taking opioid medicines. Remember that a good treatment plan uses more than one approach and minimizes the chance of side effects. Be honest about the amount of medicines you take and about any drug or alcohol use. Get pain medicine prescriptions from only one health care provider. Pain can be managed with many types of alternative treatments. Ask your health care provider to refer you to one or more specialists who can help you manage pain through: Physical or occupational therapy. Counseling (cognitive behavioral therapy). Good nutrition. Biofeedback. Massage. Meditation. Non-opioid medicine. Following a gentle exercise program. How to use opioid pain medicine Taking medicine Take your pain medicine exactly as told by your health care provider. Take it only when you need it. If your pain gets less severe, you may take less than your prescribed dose if your health care provider approves. If you are not having pain, do nottake pain medicine unless your health care provider tells you  to take it. If your pain is severe, do nottry to treat it yourself by taking more pills than instructed on your prescription. Contact your health care provider for help. Write down the times when you take your pain medicine. It is easy to become confused while on pain medicine. Writing the time can help you avoid overdose. Take other over-the-counter or prescription medicines only as told by  your health care provider. Keeping yourself and others safe  While you are taking opioid pain medicine: Do not drive, use machinery, or power tools. Do not sign legal documents. Do not drink alcohol. Do not take sleeping pills. Do not supervise children by yourself. Do not do activities that require climbing or being in high places. Do not go to a lake, river, ocean, spa, or swimming pool. Do not share your pain medicine with anyone. Keep pain medicine in a locked cabinet or in a secure area where pets and children cannot reach it. Stopping your use of opioids If you have been taking opioid medicine for more than a few weeks, you may need to slowly decrease (taper) how much you take until you stop completely. Tapering your use of opioids can decrease your risk of symptoms of withdrawal, such as: Pain and cramping in the abdomen. Nausea. Sweating. Sleepiness. Restlessness. Uncontrollable shaking (tremors). Cravings for the medicine. Do not attempt to taper your use of opioids on your own. Talk with your health care provider about how to do this. Your health care provider may prescribe a step-down schedule based on how much medicine you are taking and how long you have been taking it. Getting rid of leftover pills Do not save any leftover pills. Get rid of leftover pills safely by: Taking the medicine to a prescription take-back program. This is usually offered by the county or law enforcement. Bringing them to a pharmacy that has a drug disposal container. Flushing them down the toilet. Check the label or package insert of your medicine to see whether this is safe to do. Throwing them out in the trash. Check the label or package insert of your medicine to see whether this is safe to do. If it is safe to throw it out, remove the medicine from the original container, put it into a sealable bag or container, and mix it with used coffee grounds, food scraps, dirt, or cat litter before putting  it in the trash. Follow these instructions at home: Activity Do exercises as told by your health care provider. Avoid activities that make your pain worse. Return to your normal activities as told by your health care provider. Ask your health care provider what activities are safe for you. General instructions You may need to take these actions to prevent or treat constipation: Drink enough fluid to keep your urine pale yellow. Take over-the-counter or prescription medicines. Eat foods that are high in fiber, such as beans, whole grains, and fresh fruits and vegetables. Limit foods that are high in fat and processed sugars, such as fried or sweet foods. Keep all follow-up visits. This is important. Where to find support If you have been taking opioids for a long time, you may benefit from receiving support for quitting from a local support group or counselor. Ask your health care provider for a referral to these resources in your area. Where to find more information Centers for Disease Control and Prevention (CDC): http://www.wolf.info/ U.S. Food and Drug Administration (FDA): [place].ch Get help right away if: You may have taken too much  of an opioid (overdosed). Common symptoms of an overdose: Your breathing is slower or more shallow than normal. You have a very slow heartbeat (pulse). You have slurred speech. You have nausea and vomiting. Your pupils become very small. You have other potential symptoms: You are very confused. You faint or feel like you will faint. You have cold, clammy skin. You have blue lips or fingernails. You have thoughts of harming yourself or harming others. These symptoms may represent a serious problem that is an emergency. Do not wait to see if the symptoms will go away. Get medical help right away. Call your local emergency services (911 in the [place] Do not drive yourself to the hospital.  If you ever feel like you may hurt yourself or others, or have thoughts  about taking your own life, get help right away. Go to your nearest emergency department or: Call your local emergency services (911 in the [place] Call the [facility] 229-311-8157 in the [place].). Call a suicide crisis helpline, such as the [facility] at 9147049553 or 988 in the [place]. This is open 24 hours a day in the [place] Text the Crisis Text Line at 250-756-5249 (in the [place].). Summary Opioid medicines can help you manage moderate to severe pain for a short period of time. A pain treatment plan is an agreement between you and your health care provider. Discuss the goals of your treatment, including how much pain you might expect to have and how you will manage the pain. If you think that you or someone else may have taken too much of an opioid, get medical help right away. This information is not intended to replace advice given to you by your health care provider. Make sure you discuss any questions you have with your health care provider. Document Revised: 08/26/2020 Document Reviewed: 05/13/2020 Elsevier Patient Education  [place] directives: Advance directive discussed with you today. Even though you declined this today, please call our office should you change your mind, and we can give you the proper paperwork for you to fill out.   Conditions/risks identified: None  Next appointment: Follow up in one year for your annual wellness visit   Preventive Care 49-39 Years Old, Male Preventive care refers to lifestyle choices and visits with your health care provider that can promote health and wellness. Preventive care visits are also called wellness exams. What can I expect for my preventive care visit? Counseling During your preventive care visit, your health care provider may ask about your: Medical history, including: Past medical problems. Family medical history. Current health, including: Emotional well-being. Home  life and relationship well-being. Sexual activity. Lifestyle, including: Alcohol, nicotine or tobacco, and drug use. Access to firearms. Diet, exercise, and sleep habits. Safety issues such as seatbelt and bike helmet use. Sunscreen use. Work and work Statistician. Physical exam Your health care provider may check your: Height and weight. These may be used to calculate your BMI (body mass index). BMI is a measurement that tells if you are at a healthy weight. Waist circumference. This measures the distance around your waistline. This measurement also tells if you are at a healthy weight and may help predict your risk of certain diseases, such as type 2 diabetes and high blood pressure. Heart rate and blood pressure. Body temperature. Skin for abnormal spots. What immunizations do I need? Vaccines are usually given at various ages, according to a schedule. Your health care provider will recommend vaccines  for you based on your age, medical history, and lifestyle or other factors, such as travel or where you work. What tests do I need? Screening Your health care provider may recommend screening tests for certain conditions. This may include: Lipid and cholesterol levels. Diabetes screening. This is done by checking your blood sugar (glucose) after you have not eaten for a while (fasting). Hepatitis B test. Hepatitis C test. HIV (human immunodeficiency virus) test. STI (sexually transmitted infection) testing, if you are at risk. Talk with your health care provider about your test results, treatment options, and if necessary, the need for more tests. Follow these instructions at home: Eating and drinking  Eat a healthy diet that includes fresh fruits and vegetables, whole grains, lean protein, and low-fat dairy products. Drink enough fluid to keep your urine pale yellow. Take vitamin and mineral supplements as recommended by your health care provider. Do not drink alcohol if your health  care provider tells you not to drink. If you drink alcohol: Limit how much you have to 0-2 drinks a day. Know how much alcohol is in your drink. In the [place]., one drink equals one 12 oz bottle of beer (355 mL), one 5 oz glass of wine (148 mL), or one 1 oz glass of hard liquor (44 mL). Lifestyle Brush your teeth every morning and night with fluoride toothpaste. Floss one time each day. Exercise for at least 30 minutes 5 or more days each week. Do not use any products that contain nicotine or tobacco. These products include cigarettes, chewing tobacco, and vaping devices, such as e-cigarettes. If you need help quitting, ask your health care provider. Do not use drugs. If you are sexually active, practice safe sex. Use a condom or other form of protection to prevent STIs. Find healthy ways to manage stress, such as: Meditation, yoga, or listening to music. Journaling. Talking to a trusted person. Spending time with friends and family. Minimize exposure to UV radiation to reduce your risk of skin cancer. Safety Always wear your seat belt while driving or riding in a vehicle. Do not drive: If you have been drinking alcohol. Do not ride with someone who has been drinking. If you have been using any mind-altering substances or drugs. While texting. When you are tired or distracted. Wear a helmet and other protective equipment during sports activities. If you have firearms in your house, make sure you follow all gun safety procedures. Seek help if you have been physically or sexually abused. What's next? Go to your health care provider once a year for an annual wellness visit. Ask your health care provider how often you should have your eyes and teeth checked. Stay up to date on all vaccines. This information is not intended to replace advice given to you by your health care provider. Make sure you discuss any questions you have with your health care provider. Document Revised: 07/29/2020  Document Reviewed: 07/29/2020 Elsevier Patient Education  [place].

## 2022-04-21 NOTE — Progress Notes (Signed)
Subjective:   Timothy Marks is a 32 y.o. male who presents for Medicare Annual/Subsequent preventive examination.  Review of Systems   Virtual Visit via Telephone Note  I connected with  Timothy Marks on 04/21/22 at  1:30 PM EST by telephone and verified that I am speaking with the correct person using two identifiers.  Location: Patient: Home Provider: Office Persons participating in the virtual visit: patient/Nurse Health Advisor   I discussed the limitations, risks, security and privacy concerns of performing an evaluation and management service by telephone and the availability of in person appointments. The patient expressed understanding and agreed to proceed.  Interactive audio and video telecommunications were attempted between this nurse and patient, however failed, due to patient having technical difficulties OR patient did not have access to video capability.  We continued and completed visit with audio only.  Some vital signs may be absent or patient reported.   Criselda Peaches, LPN  Cardiac Risk Factors include: advanced age (>63mn, >>59women);male gender;Other (see comment), Risk factor comments: Dx DVT and Sickle Cell     Objective:    Today's Vitals   04/21/22 1335  Weight: 116 lb (52.6 kg)  Height: '6\' 2"'$  (1.88 m)   Body mass index is 14.89 kg/m.     04/21/2022    1:44 PM 01/24/2022    7:14 PM 01/05/2022    5:06 AM 11/28/2021   11:44 AM 09/29/2021    2:15 PM 09/14/2021    3:49 PM 09/13/2021    2:06 AM  Advanced Directives  Does Patient Have a Medical Advance Directive? No No No No No No No  Would patient like information on creating a medical advance directive? No - Patient declined No - Patient declined No - Patient declined  No - Patient declined No - Patient declined     Current Medications (verified) Outpatient Encounter Medications as of 04/21/2022  Medication Sig   cholecalciferol (VITAMIN D) 25 MCG (1000 UNIT) tablet Take 1,000 Units by mouth  daily.   folic acid (FOLVITE) 1 MG tablet Take 1 tablet (1 mg total) by mouth daily.   gabapentin (NEURONTIN) 300 MG capsule TAKE 1 CAPSULE(300 MG) BY MOUTH THREE TIMES DAILY   hydrocortisone 1 % ointment Apply 1 Application topically 2 (two) times daily.   hydroxyurea (HYDREA) 500 MG capsule Take 1 capsule (500 mg total) by mouth 2 (two) times daily. May take with food to minimize GI side effects.   hydrOXYzine (ATARAX) 25 MG tablet Take 25 mg by mouth daily as needed for anxiety or vomiting.   mirtazapine (REMERON) 7.5 MG tablet Take 7.5 mg by mouth at bedtime as needed (for sleep).   morphine (MS CONTIN) 15 MG 12 hr tablet Take 1 tablet (15 mg total) by mouth every 12 (twelve) hours.   OLANZapine (ZYPREXA) 5 MG tablet Take 1 tablet (5 mg total) by mouth at bedtime.   oxyCODONE-acetaminophen (PERCOCET) 10-325 MG tablet Take 1 tablet by mouth every 6 (six) hours as needed for pain.   rivaroxaban (XARELTO) 20 MG TABS tablet Take 1 tablet (20 mg total) by mouth daily with supper.   No facility-administered encounter medications on file as of 04/21/2022.    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   Sickle cell anemia (HCC)    Vitamin D deficiency y-6   Past Surgical History:  Procedure Laterality Date   CHOLECYSTECTOMY     Family History  Problem Relation Age of Onset   Sickle cell trait Mother    Sickle cell trait Father    Social History   Socioeconomic History   Marital status: Single    Spouse name: Not on file   Number of children: Not on file   Years of education: Not on file   Highest education level: Not on file  Occupational History   Not on  file  Tobacco Use   Smoking status: Former    Types: Cigarettes   Smokeless tobacco: Never  Vaping Use   Vaping Use: Never used  Substance and Sexual Activity   Alcohol use: No   Drug use: Yes    Frequency: 7.0 times per week    Types: Marijuana   Sexual activity: Yes  Other Topics Concern   Not on file  Social History Narrative   Not on file   Social Determinants of Health   Financial Resource Strain: Low Risk  (04/21/2022)   Overall Financial Resource Strain (CARDIA)    Difficulty of Paying Living Expenses: Not hard at all  Food Insecurity: No Food Insecurity (04/21/2022)   Hunger Vital Sign    Worried About Running Out of Food in the Last Year: Never true    Ran Out of Food in the Last Year: Never true  Transportation Needs: No Transportation Needs (04/21/2022)   PRAPARE - Hydrologist (Medical): No    Lack of Transportation (Non-Medical): No  Physical Activity: Sufficiently Active (04/21/2022)   Exercise Vital Sign    Days of Exercise per Week: 5 days    Minutes of Exercise per Session: 60 min  Stress: No Stress Concern Present (04/21/2022)   [place]    Feeling of Stress : Not at all  Social Connections: Socially Isolated (04/21/2022)   Social Connection and Isolation Panel [NHANES]    Frequency of Communication with Friends and Family: More than three times a week    Frequency of Social Gatherings with Friends and Family: More than three times a week    Attends Religious Services: Never    Marine scientist or Organizations: No    Attends Music therapist: Never    Marital Status: Never married    Tobacco Counseling Counseling given: Not Answered   Clinical Intake:  Pre-visit preparation completed: No  Pain : No/denies pain     BMI - recorded: 14.89 Nutritional Status: BMI <19  Underweight Nutritional Risks: None Diabetes: No  How often do you  need to have someone help you when you read instructions, pamphlets, or other written materials from your doctor or pharmacy?: 1 - Never  Diabetic?  No  Interpreter Needed?: No  Information entered by :: Rolene Arbour LPN   Activities of Daily Living    04/21/2022    1:42 PM 04/21/2022    1:40 PM  In your present state of health, do you have any difficulty performing the following activities:  Hearing? 0 0  Vision? 0 0  Difficulty concentrating or making decisions? 0 0  Walking or climbing stairs? 0 0  Dressing or bathing? 0 0  Doing errands, shopping? 0 0  Preparing Food and eating ?  N  Using the Toilet?  N  In the past six months, have you accidently leaked urine?  N  Do you have problems with loss of bowel control?  N  Managing your Medications?  N  Managing your Finances?  N  Housekeeping or managing your Housekeeping?  N    Patient Care Team: Tresa Garter, MD as PCP - General (Internal Medicine)  Indicate any recent Medical Services you may have received from other than [facility] providers in the past year (date may be approximate).     Assessment:   This is a routine wellness examination for Timothy Marks.  Hearing/Vision screen Hearing Screening - Comments:: Denies hearing difficulties   Vision Screening - Comments:: Wears rx glasses - up to date with routine eye exams with  [facility]  Dietary issues and exercise activities discussed: Current Exercise Habits: Home exercise routine, Type of exercise: walking, Time (Minutes): 60, Frequency (Times/Week): 5, Weekly Exercise (Minutes/Week): 300, Intensity: Moderate, Exercise limited by: None identified   Goals Addressed               This Visit's Progress     Stay Healthy (pt-stated)        I want to stay out of the hospital.       Depression Screen    04/21/2022    1:40 PM 10/14/2021    9:36 AM 04/22/2021   10:41 AM 09/30/2020    9:18 AM 02/27/2020    8:32 AM 01/16/2020    8:30 AM 10/28/2019    8:37 AM  PHQ  2/9 Scores  PHQ - 2 Score 0 0 0 0 0 0 0  PHQ- 9 Score  0  0   0    Fall Risk    04/21/2022    1:43 PM 04/17/2022    9:17 AM 10/14/2021    9:36 AM 04/22/2021   10:41 AM 09/30/2020    9:18 AM  Fall Risk   Falls in the past year? 0 0 0 0 0  Number falls in past yr: 0 0 0 0 0  Injury with Fall? 0 0 0 0 0  Risk for fall due to : No Fall Risks  No Fall Risks No Fall Risks   Follow up Falls prevention discussed  Falls evaluation completed Falls evaluation completed     FALL RISK PREVENTION PERTAINING TO THE HOME:  Any stairs in or around the home? No If so, are there any without handrails? No  Home free of loose throw rugs in walkways, pet beds, electrical cords, etc? Yes  Adequate lighting in your home to reduce risk of falls? Yes   ASSISTIVE DEVICES UTILIZED TO PREVENT FALLS:  Life alert? No  Use of a cane, walker or w/c? No  Grab bars in the bathroom? No  Shower chair or bench in shower? No  Elevated toilet seat or a handicapped toilet? No   TIMED UP AND GO:  Was the test performed? No . Audio Visit    Cognitive Function:        04/21/2022    1:44 PM  6CIT Screen  What Year? 0 points  What month? 0 points  What time? 0 points  Count back from 20 0 points  Months in reverse 0 points  Repeat phrase 0 points  Total Score 0 points    Immunizations Immunization History  Administered Date(s) Administered   DTaP 12/08/1988, 02/02/1989, 04/03/1989, 03/19/1990, 07/09/1993   H1N1 04/03/2008   HIB (PRP-OMP) 04/03/1989, 05/29/1989, 07/24/1989, 01/06/1990, 12/20/2007   HIB (PRP-T) 12/20/2007  Hepatitis A 10/05/1999   Hepatitis A, Ped/Adol-2 Dose 10/05/1999   Hepatitis B 07/28/1994, 09/02/1994, 08/19/1999   Hepatitis B, PED/ADOLESCENT 07/28/1994, 09/02/1994, 08/19/1999   IPV 12/08/1988, 02/02/1989, 03/19/1990, 07/09/1993   Influenza Split 01/20/2016, 11/30/2017   Influenza,inj,Quad PF,6+ Mos 03/04/2013, 04/01/2014, 01/20/2016, 11/30/2017, 02/27/2020, 04/22/2021    Influenza-Unspecified 10/16/2014   MMR 01/06/1990, 07/09/1993   Meningococcal Conjugate 12/20/2007, 01/20/2016   Pneumococcal Conjugate-13 10/07/1991, 01/14/2015   Pneumococcal Polysaccharide-23 09/18/2008, 03/04/2013   Pneumococcal-Unspecified 01/14/2015   Tdap 11/30/2017    TDAP status: Up to date   Covid-19 vaccine status: Declined, Education has been provided regarding the importance of this vaccine but patient still declined. Advised may receive this vaccine at local pharmacy or Health Dept.or vaccine clinic. Aware to provide a copy of the vaccination record if obtained from local pharmacy or Health Dept. Verbalized acceptance and understanding.  Qualifies for Shingles Vaccine? No   Zostavax completed No   Shingrix Completed?: No.    Education has been provided regarding the importance of this vaccine. Patient has been advised to call insurance company to determine out of pocket expense if they have not yet received this vaccine. Advised may also receive vaccine at local pharmacy or Health Dept. Verbalized acceptance and understanding.  Screening Tests Health Maintenance  Topic Date Due   Meningococcal B Vaccine (1 of 4 - Increased Risk) Never done   COVID-19 Vaccine (1) 01/07/2023 (Originally 10/06/1993)   Medicare Annual Wellness (AWV)  04/21/2023   DTaP/Tdap/Td (7 - Td or Tdap) 12/01/2027   Hepatitis C Screening  Completed   HIV Screening  Completed   HPV VACCINES  Aged Out    Health Maintenance  Health Maintenance Due  Topic Date Due   Meningococcal B Vaccine (1 of 4 - Increased Risk) Never done      Lung Cancer Screening: (Low Dose CT Chest recommended if Age 65-80 years, 30 pack-year currently smoking OR have quit w/in 15years.) does qualify.   Lung Cancer Screening Referral: Deferred  Additional Screening:  Hepatitis C Screening:  qualify; Completed 11/04/18  Vision Screening: Recommended annual ophthalmology exams for early detection of glaucoma and other  disorders of the eye. Is the patient up to date with their annual eye exam?  Yes  Who is the provider or what is the name of the office in which the patient attends annual eye exams? [facility] If pt is not established with a provider, would they like to be referred to a provider to establish care? No .   Dental Screening: Recommended annual dental exams for proper oral hygiene  Community Resource Referral / Chronic Care Management:  CRR required this visit?  No   CCM required this visit?  No      Plan:     I have personally reviewed and noted the following in the patient's chart:   Medical and social history Use of alcohol, tobacco or illicit drugs  Current medications and supplements including opioid prescriptions. Patient is currently taking opioid prescriptions. Information provided to patient regarding non-opioid alternatives. Patient advised to discuss non-opioid treatment plan with their provider. Functional ability and status Nutritional status Physical activity Advanced directives List of other physicians Hospitalizations, surgeries, and ER visits in previous 12 months Vitals Screenings to include cognitive, depression, and falls Referrals and appointments  In addition, I have reviewed and discussed with patient certain preventive protocols, quality metrics, and best practice recommendations. A written personalized care plan for preventive services as well as general preventive health recommendations were  provided to patient.     Criselda Peaches, LPN   [id number]   Nurse Notes: None

## 2022-05-02 ENCOUNTER — Other Ambulatory Visit: Payer: Self-pay | Admitting: Family Medicine

## 2022-05-02 ENCOUNTER — Telehealth: Payer: Self-pay | Admitting: Internal Medicine

## 2022-05-02 NOTE — Telephone Encounter (Signed)
Percocet refill request 

## 2022-05-02 NOTE — Telephone Encounter (Signed)
Pt called back and stated that the ER gave him 15 day of his percocet and wanted [place] to let us know!

## 2022-05-03 ENCOUNTER — Telehealth: Payer: Self-pay

## 2022-05-03 NOTE — Transitions of Care (Post Inpatient/ED Visit) (Unsigned)
   05/03/2022  Name: Timothy Marks MRN: [id number] DOB: 1990-11-08  Today's TOC FU Call Status: Today's TOC FU Call Status:: Unsuccessul Call (1st Attempt) Unsuccessful Call (1st Attempt) Date: 05/03/22  Attempted to reach the patient regarding the most recent Inpatient/ED visit.  Follow Up Plan: Additional outreach attempts will be made to reach the patient to complete the Transitions of Care (Post Inpatient/ED visit) call.   [place] LPN [place] Advisor Direct Dial [phone]

## 2022-05-04 ENCOUNTER — Other Ambulatory Visit: Payer: Self-pay

## 2022-05-04 DIAGNOSIS — G894 Chronic pain syndrome: Secondary | ICD-10-CM

## 2022-05-04 NOTE — Telephone Encounter (Signed)
Caller & Relationship to patient:  MRN #  [id number]   Call Back Number:   Date of Last Office Visit: 05/03/2022     Date of Next Office Visit: 05/17/2022    Medication(s) to be Refilled: Percocet Asking for it early  Preferred Pharmacy:   ** Please notify patient to allow 48-72 hours to process** **Let patient know to contact pharmacy at the end of the day to make sure medication is ready. ** **If patient has not been seen in a year or longer, book an appointment **Advise to use MyChart for refill requests OR to contact their pharmacy

## 2022-05-04 NOTE — Transitions of Care (Post Inpatient/ED Visit) (Signed)
   05/04/2022  Name: Timothy Marks MRN: [id number] DOB: [birth year]  Today's TOC FU Call Status: Today's TOC FU Call Status:: Successful TOC FU Call Competed Unsuccessful Call (1st Attempt) Date: 05/03/22 [facility] FU Call Complete Date: 05/04/22  Transition Care Management Follow-up Telephone Call Date of Discharge: 05/02/22 Discharge Facility: Other ([facility]) Name of Other (Non-[facility]) Discharge Facility: [facility] Type of Discharge: Inpatient Admission Primary Inpatient Discharge Diagnosis:: Sickle Cell Pain Crisis How have you been since you were released from the hospital?: Better Any questions or concerns?: No  Items Reviewed: Did you receive and understand the discharge instructions provided?: Yes Medications obtained and verified?: Yes (Medications Reviewed) Any new allergies since your discharge?: No Dietary orders reviewed?: Yes Do you have support at home?: Yes  Home Care and Equipment/Supplies: [place] Ordered?: No Any new equipment or medical supplies ordered?: No  Functional Questionnaire: Do you need assistance with bathing/showering or dressing?: No Do you need assistance with meal preparation?: No Do you need assistance with eating?: No Do you have difficulty maintaining continence: No Do you need assistance with getting out of bed/getting out of a chair/moving?: No Do you have difficulty managing or taking your medications?: No  Follow up appointments reviewed: PCP Follow-up appointment confirmed?: Yes Date of PCP follow-up appointment?: 05/17/22 Follow-up Provider: Cammie Sickle [facility] Follow-up appointment confirmed?: NA Do you need transportation to your follow-up appointment?: No Do you understand care options if your condition(s) worsen?: Yes-patient verbalized understanding    [place] LPN [place] Direct Dial [phone]

## 2022-05-09 ENCOUNTER — Telehealth: Payer: Self-pay | Admitting: Internal Medicine

## 2022-05-09 ENCOUNTER — Other Ambulatory Visit: Payer: Self-pay

## 2022-05-09 ENCOUNTER — Other Ambulatory Visit: Payer: Self-pay | Admitting: Family Medicine

## 2022-05-09 DIAGNOSIS — G894 Chronic pain syndrome: Secondary | ICD-10-CM

## 2022-05-09 MED ORDER — OXYCODONE-ACETAMINOPHEN 10-325 MG PO TABS: 1.0000 | ORAL_TABLET | Freq: Four times a day (QID) | ORAL | 0 refills | Status: DC | PRN

## 2022-05-09 NOTE — Telephone Encounter (Signed)
Percocet refill request 

## 2022-05-09 NOTE — Progress Notes (Signed)
Reviewed PDMP substance reporting system prior to prescribing opiate medications. No inconsistencies noted.   Meds ordered this encounter  Medications  . oxyCODONE-acetaminophen (PERCOCET) 10-325 MG tablet    Sig: Take 1 tablet by mouth every 6 (six) hours as needed for pain.    Dispense:  60 tablet    Refill:  0    Order Specific Question:   Supervising Provider    Answer:   JEGEDE, OLUGBEMIGA E [[id number]]     Deandre Stansel Moore Fabiano Ginley  APRN, MSN, FNP-C [facility] [address]  [place], [place] [address] [phone]  

## 2022-05-09 NOTE — Telephone Encounter (Signed)
From: [name] To: Office of Cammie Sickle, [place] Sent: 05/09/2022 8:33 AM EDT Subject: Medication Renewal Request  Refills have been requested for the following medications:   oxyCODONE-acetaminophen (PERCOCET) 10-325 MG tablet [[place]achina Hollis]  Patient Comment: Need a refill i dnt have nomore oxycodone   Preferred pharmacy: [facility] [phone] - [place], [place] Delivery method: [employer]

## 2022-05-09 NOTE — Telephone Encounter (Signed)
Please advise KH 

## 2022-05-16 NOTE — Telephone Encounter (Signed)
Caller & Relationship to patient:  MRN #  [id number]   Call Back Number:   Date of Last Office Visit: 05/03/2022     Date of Next Office Visit: 05/09/2022    Medication(s) to be Refilled: Morphine  Preferred Pharmacy:   ** Please notify patient to allow 48-72 hours to process** **Let patient know to contact pharmacy at the end of the day to make sure medication is ready. ** **If patient has not been seen in a year or longer, book an appointment **Advise to use MyChart for refill requests OR to contact their pharmacy

## 2022-05-17 ENCOUNTER — Ambulatory Visit (INDEPENDENT_AMBULATORY_CARE_PROVIDER_SITE_OTHER): Payer: 59 | Admitting: Family Medicine

## 2022-05-17 VITALS — BP 101/55 | HR 77 | Temp 97.9°F | Ht 74.0 in | Wt 123.8 lb

## 2022-05-17 DIAGNOSIS — G894 Chronic pain syndrome: Secondary | ICD-10-CM | POA: Diagnosis not present

## 2022-05-17 DIAGNOSIS — Z202 Contact with and (suspected) exposure to infections with a predominantly sexual mode of transmission: Secondary | ICD-10-CM

## 2022-05-17 DIAGNOSIS — R636 Underweight: Secondary | ICD-10-CM

## 2022-05-17 DIAGNOSIS — D571 Sickle-cell disease without crisis: Secondary | ICD-10-CM

## 2022-05-17 DIAGNOSIS — E559 Vitamin D deficiency, unspecified: Secondary | ICD-10-CM | POA: Diagnosis not present

## 2022-05-17 MED ORDER — MORPHINE SULFATE ER 15 MG PO TBCR: 15.0000 mg | EXTENDED_RELEASE_TABLET | Freq: Two times a day (BID) | ORAL | 0 refills | Status: DC

## 2022-05-17 NOTE — Progress Notes (Signed)
Established Patient Office Visit  Subjective   Patient ID: Timothy Marks, male    DOB: 21-Nov-1990  Age: 32 y.o. MRN: [id number]  Chief Complaint  Patient presents with   Sickle Cell Anemia    Follow up    Timothy Marks is a 32 year old male with a medical history significant for a syndrome, opiate dependence and tolerance and anemia of chronic disease presents for medication management.  Patient has a history of chronic pain that is typically to his low back.  Pain has been well-controlled on current medication regimen.  Patient last had these medications on last night with sustained relief.  He is not having any pain at this time.  He denies headache, chest pain, urinary symptoms, nausea, vomiting, or diarrhea.  Patient denies any alcohol or drug use.  He is sexually active without barrier protection.  He is not up-to-date with routine eye exam.  He does not perform monthly self testicular exams.    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   Arm pain, diffuse, right 07/02/2021   Marijuana smoker 01/27/2021   Lab test positive for detection of COVID-19 virus 01/23/2020   Medication management 01/16/2020   Underweight 06/14/2019   Generalized weakness 03/31/2019   Sickle-cell crisis 03/24/2019   SIRS (systemic inflammatory response syndrome) 02/10/2019   Abnormal liver function 11/04/2018   Sickle cell anemia with crisis 05/22/2018   Narcotic dependence 02/10/2018   Acute bilateral low back pain without sciatica    Other chronic pain    Anemia    Socially inappropriate behavior 12/02/2017   Thrombocythemia    Physical deconditioning    Agitation    Chronic pain syndrome    Recurrent cold sores    Fever of unknown origin    Drug-seeking behavior 08/30/2017   Pain    Sickle cell crisis 07/16/2017   Adjustment disorder with mixed disturbance of emotions and conduct    Bilateral pulmonary infiltrates on  chest x-ray 11/21/2016   History of DVT (deep vein thrombosis) 11/17/2016   Suicidal ideations 10/11/2016   Reticulocytosis 10/09/2016   DVT (deep venous thrombosis) 08/02/2016   Has difficulty accessing primary care provider for most visits 07/11/2015   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 06/29/2015   Hordeolum externum of left upper eyelid 05/31/2015   Major depressive disorder with current active episode 11/09/2014   Hb-SS disease with acute chest syndrome 11/08/2014   Pneumonia of right upper lobe due to infectious organism 11/07/2014   Constipation 08/03/2014   Leukocytosis 06/30/2014   h/o Priapism 05/06/2014   Protein-calorie malnutrition, severe (HCC) 04/09/2014   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, left shoulder (HCC)    Vitamin D deficiency    Sickle cell anemia 03/30/2014   Hyperbilirubinemia 03/30/2014   Hypokalemia 02/07/2014   Mandible fracture 05/03/2013   Aggressive behavior 01/25/2013   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   Sickle cell anemia with pain (HCC) 01/24/2013   Sickle cell pain crisis 01/24/2013   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   Other hemoglobinopathies 04/22/2011   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   Sickle cell anemia (HCC)    Vitamin D deficiency y-6   Past Surgical History:  Procedure Laterality Date   CHOLECYSTECTOMY  Social History   Tobacco Use   Smoking status: Former    Types: Cigarettes   Smokeless tobacco: Never  Vaping Use   Vaping Use: Never used  Substance Use Topics   Alcohol use: No   Drug use: Yes    Frequency: 7.0 times per week    Types: Marijuana   Social History   Socioeconomic History   Marital status: Single    Spouse name: Not on file   Number of  children: Not on file   Years of education: Not on file   Highest education level: Not on file  Occupational History   Not on file  Tobacco Use   Smoking status: Former    Types: Cigarettes   Smokeless tobacco: Never  Vaping Use   Vaping Use: Never used  Substance and Sexual Activity   Alcohol use: No   Drug use: Yes    Frequency: 7.0 times per week    Types: Marijuana   Sexual activity: Yes  Other Topics Concern   Not on file  Social History Narrative   Not on file   Social Determinants of Health   Financial Resource Strain: Low Risk  (04/21/2022)   Overall Financial Resource Strain (CARDIA)    Difficulty of Paying Living Expenses: Not hard at all  Food Insecurity: No Food Insecurity (04/21/2022)   Hunger Vital Sign    Worried About Running Out of Food in the Last Year: Never true    Ran Out of Food in the Last Year: Never true  Transportation Needs: No Transportation Needs (04/21/2022)   PRAPARE - Administrator, Civil Service (Medical): No    Lack of Transportation (Non-Medical): No  Physical Activity: Sufficiently Active (04/21/2022)   Exercise Vital Sign    Days of Exercise per Week: 5 days    Minutes of Exercise per Session: 60 min  Stress: No Stress Concern Present (04/21/2022)   [employer] of Occupational Health - Occupational Stress Questionnaire    Feeling of Stress : Not at all  Social Connections: Socially Isolated (04/21/2022)   Social Connection and Isolation Panel [NHANES]    Frequency of Communication with Friends and Family: More than three times a week    Frequency of Social Gatherings with Friends and Family: More than three times a week    Attends Religious Services: Never    Database administrator or Organizations: No    Attends Banker Meetings: Never    Marital Status: Never married  Intimate Partner Violence: Not At Risk (04/21/2022)   Humiliation, Afraid, Rape, and Kick questionnaire    Fear of Current or Ex-Partner: No     Emotionally Abused: No    Physically Abused: No    Sexually Abused: No   Family Status  Relation Name Status   Mother  Alive   Father  Alive   Family History  Problem Relation Age of Onset   Sickle cell trait Mother    Sickle cell trait Father    Allergies  Allergen Reactions   Buprenorphine Hcl Hives   Levaquin [Levofloxacin] Itching   Meperidine Rash   Morphine Hives   Morphine And Related Hives   Tramadol Hives   Vancomycin Itching   Zosyn [Piperacillin Sod-Tazobactam So] Itching, Rash and Other (See Comments)    Has taken rocephin in the past      Review of Systems  Constitutional: Negative.  Negative for chills and fever.  HENT: Negative.    Eyes: Negative.  Respiratory: Negative.    Cardiovascular: Negative.   Gastrointestinal: Negative.   Genitourinary: Negative.   Musculoskeletal:  Positive for back pain and joint pain. Falls: sickle cell panel. Skin: Negative.   Neurological: Negative.   Psychiatric/Behavioral: Negative.        Objective:     BP (!) 101/55   Pulse 77   Temp 97.9 F (36.6 C)   Ht 6\' 2"  (1.88 m)   Wt 123 lb 12.8 oz (56.2 kg)   SpO2 100%   BMI 15.89 kg/m  BP Readings from Last 3 Encounters:  05/17/22 (!) 101/55  01/27/22 113/65  01/24/22 115/77   Wt Readings from Last 3 Encounters:  05/17/22 123 lb 12.8 oz (56.2 kg)  04/21/22 116 lb (52.6 kg)  01/24/22 122 lb (55.3 kg)      Physical Exam Constitutional:      Appearance: Normal appearance.  Eyes:     Pupils: Pupils are equal, round, and reactive to light.  Cardiovascular:     Rate and Rhythm: Normal rate and regular rhythm.     Pulses: Normal pulses.  Pulmonary:     Effort: Pulmonary effort is normal.  Abdominal:     General: Bowel sounds are normal.     Palpations: Abdomen is soft.  Musculoskeletal:        General: Normal range of motion.  Skin:    General: Skin is warm.  Neurological:     General: No focal deficit present.     Mental Status: He is alert.  Mental status is at baseline.  Psychiatric:        Mood and Affect: Mood normal.        Behavior: Behavior normal.        Thought Content: Thought content normal.        Judgment: Judgment normal.      Results for orders placed or performed in visit on 05/17/22  161096 11+Oxyco+Alc+Crt-Bund  Result Value Ref Range   Ethanol Negative Cutoff=0.020 %   Amphetamines, Urine Negative Cutoff=1000 ng/mL   Barbiturate Negative Cutoff=200 ng/mL   BENZODIAZ UR QL Negative Cutoff=200 ng/mL   Cannabinoid Quant, Ur See Final Results Cutoff=50 ng/mL   Cocaine (Metabolite) Negative Cutoff=300 ng/mL   OPIATE SCREEN URINE Negative Cutoff=300 ng/mL   Oxycodone/Oxymorphone, Urine Negative Cutoff=300 ng/mL   Phencyclidine Negative Cutoff=25 ng/mL   Methadone Screen, Urine Negative Cutoff=300 ng/mL   Propoxyphene Negative Cutoff=300 ng/mL   Meperidine Negative Cutoff=200 ng/mL   Tramadol Negative Cutoff=200 ng/mL   Creatinine 99.6 20.0 - 300.0 mg/dL   pH, Urine 7.6 4.5 - 8.9  Sickle Cell Panel  Result Value Ref Range   Glucose 90 70 - 99 mg/dL   BUN 5 (L) 6 - 20 mg/dL   Creatinine, Ser 0.45 (L) 0.76 - 1.27 mg/dL   eGFR 409 >81 XB/JYN/8.29   BUN/Creatinine Ratio 7 (L) 9 - 20   Sodium 141 134 - 144 mmol/L   Potassium 4.0 3.5 - 5.2 mmol/L   Chloride 104 96 - 106 mmol/L   CO2 20 20 - 29 mmol/L   Calcium 9.8 8.7 - 10.2 mg/dL   Total Protein 8.7 (H) 6.0 - 8.5 g/dL   Albumin 4.3 4.1 - 5.1 g/dL   Globulin, Total 4.4 1.5 - 4.5 g/dL   Albumin/Globulin Ratio 1.0 (L) 1.2 - 2.2   Bilirubin Total 0.7 0.0 - 1.2 mg/dL   Alkaline Phosphatase 96 44 - 121 IU/L   AST 29 0 - 40 IU/L   ALT 14  0 - 44 IU/L   Ferritin 1,985 (H) 30 - 400 ng/mL   Vit D, 25-Hydroxy 34.3 30.0 - 100.0 ng/mL   WBC 5.7 3.4 - 10.8 x10E3/uL   RBC 4.25 4.14 - 5.80 x10E6/uL   Hemoglobin 12.3 (L) 13.0 - 17.7 g/dL   Hematocrit 16.1 09.6 - 51.0 %   MCV 92 79 - 97 fL   MCH 28.9 26.6 - 33.0 pg   MCHC 31.6 31.5 - 35.7 g/dL   RDW 04.5 (H)  40.9 - 15.4 %   Platelets 589 (H) 150 - 450 x10E3/uL   Neutrophils 61 Not Estab. %   Lymphs 31 Not Estab. %   Monocytes 7 Not Estab. %   Eos 1 Not Estab. %   Basos 0 Not Estab. %   Neutrophils Absolute 3.5 1.4 - 7.0 x10E3/uL   Lymphocytes Absolute 1.7 0.7 - 3.1 x10E3/uL   Monocytes Absolute 0.4 0.1 - 0.9 x10E3/uL   EOS (ABSOLUTE) 0.1 0.0 - 0.4 x10E3/uL   Basophils Absolute 0.0 0.0 - 0.2 x10E3/uL   Immature Granulocytes 0 Not Estab. %   Immature Grans (Abs) 0.0 0.0 - 0.1 x10E3/uL   NRBC 3 (H) 0 - 0 %   Retic Ct Pct 6.2 (H) 0.6 - 2.6 %  RPR+HIV+GC+CT Panel  Result Value Ref Range   RPR Ser Ql Non Reactive Non Reactive   HIV Screen 4th Generation wRfx Non Reactive Non Reactive   Chlamydia trachomatis, NAA Negative Negative   Neisseria Gonorrhoeae by PCR Negative Negative  Cannabinoid Conf, Ur  Result Value Ref Range   CANNABINOIDS Positive (A) Cutoff=50   Carboxy THC GC/MS Conf >750 Cutoff=15 ng/mL    Last CBC Lab Results  Component Value Date   WBC 5.7 05/17/2022   HGB 12.3 (L) 05/17/2022   HCT 38.9 05/17/2022   MCV 92 05/17/2022   MCH 28.9 05/17/2022   RDW 19.5 (H) 05/17/2022   PLT 589 (H) 05/17/2022   Last metabolic panel Lab Results  Component Value Date   GLUCOSE 90 05/17/2022   NA 141 05/17/2022   K 4.0 05/17/2022   CL 104 05/17/2022   CO2 20 05/17/2022   BUN 5 (L) 05/17/2022   CREATININE 0.70 (L) 05/17/2022   GFRNONAA >60 01/27/2022   CALCIUM 9.8 05/17/2022   PHOS 3.5 01/23/2020   PROT 8.7 (H) 05/17/2022   ALBUMIN 4.3 05/17/2022   LABGLOB 4.4 05/17/2022   AGRATIO 1.0 (L) 05/17/2022   BILITOT 0.7 05/17/2022   ALKPHOS 96 05/17/2022   AST 29 05/17/2022   ALT 14 05/17/2022   ANIONGAP 8 01/27/2022   Last lipids No results found for: "CHOL", "HDL", "LDLCALC", "LDLDIRECT", "TRIG", "CHOLHDL" Last hemoglobin A1c No results found for: "HGBA1C" Last thyroid functions Lab Results  Component Value Date   TSH 0.884 08/31/2018   T4TOTAL 8.1 08/31/2018    Last vitamin D Lab Results  Component Value Date   VD25OH 34.3 05/17/2022   Last vitamin B12 and Folate No results found for: "VITAMINB12", "FOLATE"    The ASCVD Risk score (Arnett DK, et al., 2019) failed to calculate for the following reasons:   The 2019 ASCVD risk score is only valid for ages 36 to 23    Assessment & Plan:   Problem List Items Addressed This Visit       Other   Vitamin D deficiency (Chronic)   Relevant Orders   Sickle Cell Panel (Completed)   Sickle cell anemia (Chronic)   Relevant Medications   morphine (MS CONTIN)  15 MG 12 hr tablet   Underweight   Chronic pain syndrome   Relevant Medications   morphine (MS CONTIN) 15 MG 12 hr tablet   Other Relevant Orders   161096 11+Oxyco+Alc+Crt-Bund (Completed)   Other Visit Diagnoses     Hb-SS disease without crisis    -  Primary   Relevant Orders   [id number] 11+Oxyco+Alc+Crt-Bund (Completed)   Sickle Cell Panel (Completed)   RPR+HIV+GC+CT Panel (Completed)   Possible exposure to STD           Return in about 3 months (around 08/16/2022) for sickle cell anemia.   [name]  APRN, MSN, FNP-C Patient Care [facility] [address] [place], [place] [address] [phone]

## 2022-05-18 LAB — DRUG SCREEN 764883 11+OXYCO+ALC+CRT-BUND

## 2022-05-20 LAB — RPR+HIV+GC+CT PANEL
Chlamydia trachomatis, NAA: NEGATIVE
HIV Screen 4th Generation wRfx: NONREACTIVE
Neisseria Gonorrhoeae by PCR: NEGATIVE
RPR Ser Ql: NONREACTIVE

## 2022-05-21 LAB — CMP14+CBC/D/PLT+FER+RETIC+V...
ALT: 14 IU/L (ref 0–44)
AST: 29 IU/L (ref 0–40)
Albumin/Globulin Ratio: 1 — ABNORMAL LOW (ref 1.2–2.2)
Albumin: 4.3 g/dL (ref 4.1–5.1)
Alkaline Phosphatase: 96 IU/L (ref 44–121)
BUN/Creatinine Ratio: 7 — ABNORMAL LOW (ref 9–20)
BUN: 5 mg/dL — ABNORMAL LOW (ref 6–20)
Basophils Absolute: 0 10*3/uL (ref 0.0–0.2)
Basos: 0 %
Bilirubin Total: 0.7 mg/dL (ref 0.0–1.2)
CO2: 20 mmol/L (ref 20–29)
Calcium: 9.8 mg/dL (ref 8.7–10.2)
Chloride: 104 mmol/L (ref 96–106)
Creatinine, Ser: 0.7 mg/dL — ABNORMAL LOW (ref 0.76–1.27)
EOS (ABSOLUTE): 0.1 10*3/uL (ref 0.0–0.4)
Eos: 1 %
Ferritin: 1985 ng/mL — ABNORMAL HIGH (ref 30–400)
Globulin, Total: 4.4 g/dL (ref 1.5–4.5)
Glucose: 90 mg/dL (ref 70–99)
Hematocrit: 38.9 % (ref 37.5–51.0)
Hemoglobin: 12.3 g/dL — ABNORMAL LOW (ref 13.0–17.7)
Immature Grans (Abs): 0 10*3/uL (ref 0.0–0.1)
Immature Granulocytes: 0 %
Lymphocytes Absolute: 1.7 10*3/uL (ref 0.7–3.1)
Lymphs: 31 %
MCH: 28.9 pg (ref 26.6–33.0)
MCHC: 31.6 g/dL (ref 31.5–35.7)
MCV: 92 fL (ref 79–97)
Monocytes Absolute: 0.4 10*3/uL (ref 0.1–0.9)
Monocytes: 7 %
NRBC: 3 % — ABNORMAL HIGH (ref 0–0)
Neutrophils Absolute: 3.5 10*3/uL (ref 1.4–7.0)
Neutrophils: 61 %
Platelets: 589 10*3/uL — ABNORMAL HIGH (ref 150–450)
Potassium: 4 mmol/L (ref 3.5–5.2)
RBC: 4.25 x10E6/uL (ref 4.14–5.80)
RDW: 19.5 % — ABNORMAL HIGH (ref 11.6–15.4)
Retic Ct Pct: 6.2 % — ABNORMAL HIGH (ref 0.6–2.6)
Sodium: 141 mmol/L (ref 134–144)
Total Protein: 8.7 g/dL — ABNORMAL HIGH (ref 6.0–8.5)
Vit D, 25-Hydroxy: 34.3 ng/mL (ref 30.0–100.0)
WBC: 5.7 10*3/uL (ref 3.4–10.8)
eGFR: 125 mL/min/{1.73_m2} (ref >59)

## 2022-05-22 LAB — DRUG SCREEN 764883 11+OXYCO+ALC+CRT-BUND
Amphetamines, Urine: NEGATIVE ng/mL
BENZODIAZ UR QL: NEGATIVE ng/mL
Barbiturate: NEGATIVE ng/mL
Cocaine (Metabolite): NEGATIVE ng/mL
Creatinine: 99.6 mg/dL (ref 20.0–300.0)
Ethanol: NEGATIVE %
Meperidine: NEGATIVE ng/mL
Methadone Screen, Urine: NEGATIVE ng/mL
OPIATE SCREEN URINE: NEGATIVE ng/mL
Oxycodone/Oxymorphone, Urine: NEGATIVE ng/mL
Phencyclidine: NEGATIVE ng/mL
Propoxyphene: NEGATIVE ng/mL
Tramadol: NEGATIVE ng/mL
pH, Urine: 7.6 (ref 4.5–8.9)

## 2022-05-22 LAB — CANNABINOID CONFIRMATION, UR
CANNABINOIDS: POSITIVE — AB
Carboxy THC GC/MS Conf: >750 ng/mL

## 2022-05-23 ENCOUNTER — Other Ambulatory Visit: Payer: Self-pay | Admitting: Family Medicine

## 2022-05-23 DIAGNOSIS — G894 Chronic pain syndrome: Secondary | ICD-10-CM

## 2022-05-23 MED ORDER — OXYCODONE-ACETAMINOPHEN 10-325 MG PO TABS
1.0000 | ORAL_TABLET | Freq: Four times a day (QID) | ORAL | 0 refills | Status: DC | PRN
Start: 2022-05-23 — End: 2022-06-08

## 2022-05-23 NOTE — Telephone Encounter (Signed)
Caller & Relationship to patient:  MRN #  [id number]   Call Back Number:   Date of Last Office Visit: 05/03/2022     Date of Next Office Visit: 05/09/2022    Medication(s) to be Refilled: Percocet  Preferred Pharmacy:   ** Please notify patient to allow 48-72 hours to process** **Let patient know to contact pharmacy at the end of the day to make sure medication is ready. ** **If patient has not been seen in a year or longer, book an appointment **Advise to use MyChart for refill requests OR to contact their pharmacy

## 2022-05-23 NOTE — Progress Notes (Signed)
Reviewed PDMP substance reporting system prior to prescribing opiate medications. No inconsistencies noted.   Meds ordered this encounter  Medications  . oxyCODONE-acetaminophen (PERCOCET) 10-325 MG tablet    Sig: Take 1 tablet by mouth every 6 (six) hours as needed for pain.    Dispense:  60 tablet    Refill:  0    Order Specific Question:   Supervising Provider    Answer:   JEGEDE, OLUGBEMIGA E [[id number]]     Timothy Marks Timothy Yordy  APRN, MSN, FNP-C [facility] Care [facility] [address]  [place], [place] [address] [phone]  

## 2022-06-07 ENCOUNTER — Telehealth: Payer: Self-pay | Admitting: Internal Medicine

## 2022-06-07 ENCOUNTER — Other Ambulatory Visit: Payer: Self-pay

## 2022-06-07 DIAGNOSIS — G894 Chronic pain syndrome: Secondary | ICD-10-CM

## 2022-06-07 NOTE — Telephone Encounter (Signed)
From: Gertie Baron To: Office of Julianne Handler, [place] Sent: 06/07/2022 2:50 PM EDT Subject: Medication Renewal Request  Refills have been requested for the following medications:   oxyCODONE-acetaminophen (PERCOCET) 10-325 MG tablet [Lachina Hollis]  Patient Comment: Need a refill its due today   Preferred pharmacy: [facility] [phone] - [place], [place] - [address] Delivery method: [employer]

## 2022-06-07 NOTE — Telephone Encounter (Signed)
Caller & Relationship to patient:  MRN #  [id number]   Call Back Number:   Date of Last Office Visit: 05/17/2022     Date of Next Office Visit: 08/23/2022    Medication(s) to be Refilled: Percocet  Preferred Pharmacy:   ** Please notify patient to allow 48-72 hours to process** **Let patient know to contact pharmacy at the end of the day to make sure medication is ready. ** **If patient has not been seen in a year or longer, book an appointment **Advise to use MyChart for refill requests OR to contact their pharmacy

## 2022-06-08 ENCOUNTER — Other Ambulatory Visit: Payer: Self-pay | Admitting: Family Medicine

## 2022-06-08 DIAGNOSIS — G894 Chronic pain syndrome: Secondary | ICD-10-CM

## 2022-06-08 MED ORDER — OXYCODONE-ACETAMINOPHEN 10-325 MG PO TABS
1.0000 | ORAL_TABLET | Freq: Four times a day (QID) | ORAL | 0 refills | Status: DC | PRN
Start: 2022-06-08 — End: 2022-06-21

## 2022-06-08 NOTE — Progress Notes (Signed)
Reviewed PDMP substance reporting system prior to prescribing opiate medications. No inconsistencies noted.   Meds ordered this encounter  Medications  . oxyCODONE-acetaminophen (PERCOCET) 10-325 MG tablet    Sig: Take 1 tablet by mouth every 6 (six) hours as needed for pain.    Dispense:  60 tablet    Refill:  0    Order Specific Question:   Supervising Provider    Answer:   JEGEDE, OLUGBEMIGA E [[id number]]     Timothy Marks Timothy Leinweber  APRN, MSN, FNP-C [facility] [address]  [place], [place] [address] [phone]  

## 2022-06-14 ENCOUNTER — Other Ambulatory Visit: Payer: Self-pay

## 2022-06-14 ENCOUNTER — Other Ambulatory Visit: Payer: Self-pay | Admitting: Family Medicine

## 2022-06-14 DIAGNOSIS — G894 Chronic pain syndrome: Secondary | ICD-10-CM

## 2022-06-14 DIAGNOSIS — D571 Sickle-cell disease without crisis: Secondary | ICD-10-CM

## 2022-06-14 MED ORDER — MORPHINE SULFATE ER 15 MG PO TBCR
15.0000 mg | EXTENDED_RELEASE_TABLET | Freq: Two times a day (BID) | ORAL | 0 refills | Status: DC
Start: 2022-06-16 — End: 2022-07-14

## 2022-06-14 NOTE — Telephone Encounter (Signed)
Caller & Relationship to patient:  MRN #  [id number]   Call Back Number:   Date of Last Office Visit: 06/07/2022     Date of Next Office Visit: 08/23/2022    Medication(s) to be Refilled: Morphine  Preferred Pharmacy:   ** Please notify patient to allow 48-72 hours to process** **Let patient know to contact pharmacy at the end of the day to make sure medication is ready. ** **If patient has not been seen in a year or longer, book an appointment **Advise to use MyChart for refill requests OR to contact their pharmacy

## 2022-06-14 NOTE — Progress Notes (Signed)
Reviewed PDMP substance reporting system prior to prescribing opiate medications. No inconsistencies noted.  Meds ordered this encounter  Medications   morphine (MS CONTIN) 15 MG 12 hr tablet    Sig: Take 1 tablet (15 mg total) by mouth every 12 (twelve) hours.    Dispense:  60 tablet    Refill:  0    Order Specific Question:   Supervising Provider    Answer:   JEGEDE, OLUGBEMIGA E [[id number]]   Timothy Tunnell Moore Robertta Halfhill  APRN, MSN, FNP-C Patient Care [facility] [address]  [place], [place] [address] [phone]  

## 2022-06-14 NOTE — Telephone Encounter (Signed)
Please advise Kh 

## 2022-06-14 NOTE — Telephone Encounter (Signed)
From: Gertie Baron To: Office of Julianne Handler, [place] Sent: 06/14/2022 11:01 AM EDT Subject: Medication Renewal Request  Refills have been requested for the following medications:   morphine (MS CONTIN) 15 MG 12 hr tablet [Lachina Hollis]  Patient Comment: Need a refill its due today   Preferred pharmacy: [facility] [phone] - [place], [place] - [address] Delivery method: [employer]

## 2022-06-21 ENCOUNTER — Other Ambulatory Visit: Payer: Self-pay | Admitting: Family Medicine

## 2022-06-21 ENCOUNTER — Telehealth: Payer: Self-pay

## 2022-06-21 DIAGNOSIS — G894 Chronic pain syndrome: Secondary | ICD-10-CM

## 2022-06-21 MED ORDER — OXYCODONE-ACETAMINOPHEN 10-325 MG PO TABS
1.0000 | ORAL_TABLET | Freq: Four times a day (QID) | ORAL | 0 refills | Status: DC | PRN
Start: 2022-06-23 — End: 2022-07-04

## 2022-06-21 NOTE — Telephone Encounter (Signed)
From: Timothy Marks To: Office of Julianne Handler, [place] Sent: 06/21/2022 8:03 AM EDT Subject: Medication Renewal Request  Refills have been requested for the following medications:   oxyCODONE-acetaminophen (PERCOCET) 10-325 MG tablet [Timothy Marks]  Patient Comment: Need a refill pls   Preferred pharmacy: [facility] [phone] - [place], [place] - [address] Delivery method: [employer]

## 2022-06-21 NOTE — Telephone Encounter (Signed)
Caller & Relationship to patient:  MRN #  [id number]   Call Back Number:   Date of Last Office Visit: 06/14/2022     Date of Next Office Visit: 08/23/2022    Medication(s) to be Refilled: Percocet  Preferred Pharmacy:   ** Please notify patient to allow 48-72 hours to process** **Let patient know to contact pharmacy at the end of the day to make sure medication is ready. ** **If patient has not been seen in a year or longer, book an appointment **Advise to use MyChart for refill requests OR to contact their pharmacy

## 2022-06-21 NOTE — Progress Notes (Signed)
Reviewed PDMP substance reporting system prior to prescribing opiate medications. No inconsistencies noted.   Meds ordered this encounter  Medications  . oxyCODONE-acetaminophen (PERCOCET) 10-325 MG tablet    Sig: Take 1 tablet by mouth every 6 (six) hours as needed for pain.    Dispense:  60 tablet    Refill:  0    Order Specific Question:   Supervising Provider    Answer:   JEGEDE, OLUGBEMIGA E [[id number]]     Anyssa Sharpless Moore Keelan Pomerleau  APRN, MSN, FNP-C [facility] [address]  [place], [place] [address] [phone]  

## 2022-06-21 NOTE — Telephone Encounter (Signed)
Please advise KH 

## 2022-06-28 ENCOUNTER — Encounter (HOSPITAL_BASED_OUTPATIENT_CLINIC_OR_DEPARTMENT_OTHER): Payer: Self-pay

## 2022-06-28 ENCOUNTER — Other Ambulatory Visit: Payer: Self-pay

## 2022-06-28 ENCOUNTER — Telehealth (HOSPITAL_COMMUNITY): Payer: Self-pay | Admitting: General Practice

## 2022-06-28 ENCOUNTER — Emergency Department (HOSPITAL_BASED_OUTPATIENT_CLINIC_OR_DEPARTMENT_OTHER)
Admission: EM | Admit: 2022-06-28 | Discharge: 2022-06-28 | Disposition: A | Discharge status: Home / Self Care | Payer: 59 | Attending: Emergency Medicine | Admitting: Emergency Medicine

## 2022-06-28 DIAGNOSIS — D5700 Hb-SS disease with crisis, unspecified: Secondary | ICD-10-CM

## 2022-06-28 DIAGNOSIS — D57219 Sickle-cell/Hb-C disease with crisis, unspecified: Secondary | ICD-10-CM | POA: Diagnosis present

## 2022-06-28 LAB — CBC WITH DIFFERENTIAL/PLATELET
Abs Immature Granulocytes: 0.03 10*3/uL (ref 0.00–0.07)
Basophils Absolute: 0.1 10*3/uL (ref 0.0–0.1)
Basophils Relative: 1 %
Eosinophils Absolute: 0.2 10*3/uL (ref 0.0–0.5)
Eosinophils Relative: 2 %
HCT: 32.5 % — ABNORMAL LOW (ref 39.0–52.0)
Hemoglobin: 11.2 g/dL — ABNORMAL LOW (ref 13.0–17.0)
Immature Granulocytes: 0 %
Lymphocytes Relative: 28 %
Lymphs Abs: 2.3 10*3/uL (ref 0.7–4.0)
MCH: 28.4 pg (ref 26.0–34.0)
MCHC: 34.5 g/dL (ref 30.0–36.0)
MCV: 82.5 fL (ref 80.0–100.0)
Monocytes Absolute: 0.8 10*3/uL (ref 0.1–1.0)
Monocytes Relative: 10 %
Neutro Abs: 4.8 10*3/uL (ref 1.7–7.7)
Neutrophils Relative %: 59 %
Platelets: 513 10*3/uL — ABNORMAL HIGH (ref 150–400)
RBC Morphology: NONE SEEN
RBC: 3.94 MIL/uL — ABNORMAL LOW (ref 4.22–5.81)
RDW: 19.7 % — ABNORMAL HIGH (ref 11.5–15.5)
WBC: 8.3 10*3/uL (ref 4.0–10.5)
nRBC: 1.2 % — ABNORMAL HIGH (ref 0.0–0.2)

## 2022-06-28 LAB — RETICULOCYTES
Immature Retic Fract: 33.7 % — ABNORMAL HIGH (ref 2.3–15.9)
RBC.: 3.9 MIL/uL — ABNORMAL LOW (ref 4.22–5.81)
Retic Count, Absolute: 283.5 10*3/uL — ABNORMAL HIGH (ref 19.0–186.0)
Retic Ct Pct: 7.3 % — ABNORMAL HIGH (ref 0.4–3.1)

## 2022-06-28 LAB — COMPREHENSIVE METABOLIC PANEL
ALT: 6 U/L (ref 0–44)
AST: 19 U/L (ref 15–41)
Albumin: 4.6 g/dL (ref 3.5–5.0)
Alkaline Phosphatase: 69 U/L (ref 38–126)
Anion gap: 8 (ref 5–15)
BUN: 6 mg/dL (ref 6–20)
CO2: 26 mmol/L (ref 22–32)
Calcium: 9.3 mg/dL (ref 8.9–10.3)
Chloride: 103 mmol/L (ref 98–111)
Creatinine, Ser: 0.61 mg/dL (ref 0.61–1.24)
GFR, Estimated: >60 mL/min (ref >60)
Glucose, Bld: 101 mg/dL — ABNORMAL HIGH (ref 70–99)
Potassium: 3.7 mmol/L (ref 3.5–5.1)
Sodium: 137 mmol/L (ref 135–145)
Total Bilirubin: 3.1 mg/dL — ABNORMAL HIGH (ref 0.3–1.2)
Total Protein: 8 g/dL (ref 6.5–8.1)

## 2022-06-28 MED ORDER — DIPHENHYDRAMINE HCL 25 MG PO CAPS
25.0000 mg | ORAL_CAPSULE | ORAL | Status: DC | PRN
  Administered 2022-06-28: 50 mg via ORAL
  Filled 2022-06-28: qty 2

## 2022-06-28 MED ORDER — HYDROMORPHONE HCL 1 MG/ML IJ SOLN
2.0000 mg | INTRAMUSCULAR | Status: AC
  Administered 2022-06-28: 2 mg via INTRAVENOUS
  Filled 2022-06-28: qty 2

## 2022-06-28 MED ORDER — LACTATED RINGERS IV BOLUS
500.0000 mL | Freq: Once | INTRAVENOUS | Status: AC
  Administered 2022-06-28: 500 mL via INTRAVENOUS

## 2022-06-28 MED ORDER — KETOROLAC TROMETHAMINE 15 MG/ML IJ SOLN
15.0000 mg | Freq: Once | INTRAMUSCULAR | Status: AC
  Administered 2022-06-28: 15 mg via INTRAVENOUS
  Filled 2022-06-28: qty 1

## 2022-06-28 MED ORDER — HYDROMORPHONE HCL 1 MG/ML IJ SOLN
1.0000 mg | INTRAMUSCULAR | Status: AC
  Administered 2022-06-28: 1 mg via INTRAVENOUS
  Filled 2022-06-28: qty 1

## 2022-06-28 MED ORDER — OXYCODONE HCL 5 MG PO TABS
15.0000 mg | ORAL_TABLET | Freq: Once | ORAL | Status: AC
  Administered 2022-06-28: 15 mg via ORAL
  Filled 2022-06-28: qty 3

## 2022-06-28 NOTE — ED Triage Notes (Signed)
Patient here POV from Home.  Endorses SCC Related Pain to lower Back that began 2 Days ago. Radiates down to Bilateral Legs. Home Medications attempted without relief. No N/V/D. No Fevers.   NAD Noted during Triage. A&Ox4. Gcs 15. Ambulatory.

## 2022-06-28 NOTE — ED Notes (Signed)
RN reviewed discharge instructions with pt. Pt verbalized understanding and had no further questions. VSS upon discharge.  

## 2022-06-28 NOTE — ED Provider Notes (Signed)
[place] EMERGENCY DEPARTMENT AT [facility] Provider Note   CSN: [id number] Arrival date & time: 06/28/22  1244     History {Add pertinent medical, surgical, social history, OB history to HPI:1} Chief Complaint  Patient presents with   Sickle Cell Pain Crisis    Timothy Marks is a 32 y.o. male.  HPI    32 year old male comes in with chief complaint of sickle cell pain.  Patient indicates that he started having back and lower extremity pain about 2 days ago.  Pain is consistent with his prior sickle cell pain, in terms of quality of the pain and location.  He thinks that the weather change likely triggered his pain.  Patient has taken his home scheduled morphine and breakthrough Percocet without relief.  Review of system is negative for fevers, chills, new cough, chest pain.  Home Medications Prior to Admission medications   Medication Sig Start Date End Date Taking? Authorizing Provider  cholecalciferol (VITAMIN D) 25 MCG (1000 UNIT) tablet Take 1,000 Units by mouth daily.    [provider]  folic acid (FOLVITE) 1 MG tablet Take 1 tablet (1 mg total) by mouth daily. 07/05/21   Massie Maroon, FNP  gabapentin (NEURONTIN) 300 MG capsule TAKE 1 CAPSULE(300 MG) BY MOUTH THREE TIMES DAILY 08/24/21   Quentin Angst, MD  hydrocortisone 1 % ointment Apply 1 Application topically 2 (two) times daily. 10/14/21   Quentin Angst, MD  hydroxyurea (HYDREA) 500 MG capsule Take 1 capsule (500 mg total) by mouth 2 (two) times daily. May take with food to minimize GI side effects. 12/21/21   Massie Maroon, FNP  hydrOXYzine (ATARAX) 25 MG tablet Take 25 mg by mouth daily as needed for anxiety or vomiting. 05/19/21   [provider]  ibuprofen (ADVIL) 800 MG tablet Take 800 mg by mouth every 8 (eight) hours as needed.    [provider]  mirtazapine (REMERON) 7.5 MG tablet Take 7.5 mg by mouth at bedtime as needed (for sleep). 05/19/21   [provider]  morphine (MS CONTIN) 15 MG 12 hr tablet Take 1 tablet (15 mg total) by mouth every 12 (twelve) hours. 06/16/22   Massie Maroon, FNP  OLANZapine (ZYPREXA) 5 MG tablet Take 1 tablet (5 mg total) by mouth at bedtime. 05/07/21   Massie Maroon, FNP  oxyCODONE-acetaminophen (PERCOCET) 10-325 MG tablet Take 1 tablet by mouth every 6 (six) hours as needed for pain. 06/23/22   Massie Maroon, FNP  rivaroxaban (XARELTO) 20 MG TABS tablet Take 1 tablet (20 mg total) by mouth daily with supper. 07/05/21   Massie Maroon, FNP      Allergies    Buprenorphine hcl, Levaquin [levofloxacin], Meperidine, Morphine, Morphine and related, Tramadol, Vancomycin, and Zosyn [piperacillin sod-tazobactam so]    Review of Systems   Review of Systems  All other systems reviewed and are negative.   Physical Exam Updated Vital Signs BP 124/76 (BP Location: Right Arm)   Pulse 80   Temp 98.3 F (36.8 C) (Oral)   Resp 12   Ht 6\' 2"  (1.88 m)   Wt 56.2 kg   SpO2 98%   BMI 15.91 kg/m  Physical Exam Vitals and nursing note reviewed.  Constitutional:      Appearance: He is well-developed.  HENT:     Head: Atraumatic.  Eyes:     General: No scleral icterus.    Extraocular Movements: Extraocular movements intact.     Pupils:  Pupils are equal, round, and reactive to light.  Cardiovascular:     Rate and Rhythm: Normal rate.  Pulmonary:     Effort: Pulmonary effort is normal.  Musculoskeletal:     Cervical back: Neck supple.  Skin:    General: Skin is warm.  Neurological:     Mental Status: He is alert and oriented to person, place, and time.     ED Results / Procedures / Treatments   Labs (all labs ordered are listed, but only abnormal results are displayed) Labs Reviewed  COMPREHENSIVE METABOLIC PANEL - Abnormal; Notable for the following components:      Result Value   Glucose, Bld 101 (*)    Total Bilirubin 3.1 (*)    All other components within normal limits  RETICULOCYTES  - Abnormal; Notable for the following components:   Retic Ct Pct 7.3 (*)    RBC. 3.90 (*)    Retic Count, Absolute 283.5 (*)    Immature Retic Fract 33.7 (*)    All other components within normal limits  CBC WITH DIFFERENTIAL/PLATELET - Abnormal; Notable for the following components:   RBC 3.94 (*)    Hemoglobin 11.2 (*)    HCT 32.5 (*)    RDW 19.7 (*)    Platelets 513 (*)    nRBC 1.2 (*)    All other components within normal limits    EKG None  Radiology No results found.  Procedures Procedures  {Document cardiac monitor, telemetry assessment procedure when appropriate:1}  Medications Ordered in ED Medications  oxyCODONE (Oxy IR/ROXICODONE) immediate release tablet 15 mg (has no administration in time range)  diphenhydrAMINE (BENADRYL) capsule 25-50 mg (50 mg Oral Given 06/28/22 1411)  HYDROmorphone (DILAUDID) injection 2 mg (2 mg Intravenous Given 06/28/22 1410)  HYDROmorphone (DILAUDID) injection 1 mg (1 mg Intravenous Given 06/28/22 1440)  ketorolac (TORADOL) 15 MG/ML injection 15 mg (15 mg Intravenous Given 06/28/22 1440)  lactated ringers bolus 500 mL (500 mLs Intravenous New Bag/Given 06/28/22 1429)    ED Course/ Medical Decision Making/ A&P   {   Click here for ABCD2, HEART and other calculatorsREFRESH Note before signing :1}                          Medical Decision Making Amount and/or Complexity of Data Reviewed Labs: ordered.  Risk Prescription drug management.   Pt comes in with sickle cell related pain.  Patient has history of sickle cell anemia with complications from it including vaso-occlusive crisis in the past. We have reviewed patient's previous ED visits, recent lab results and recent imaging results for this encounter.  Differential diagnosis includes vaso-occlusive pain, occult infection, dehydration, electrolyte abnormality, hyperbilirubinemia, aplastic anemia.  VSS and WNL -  hemodynamically stable.  History and exam is not indicative of any  specific source of infection at this time.  Pain appears vaso-occlusive type and typical of previous pain. Appropriate labs ordered.  Pain control and symptom management initiated while in the ED with parenteral medication. We will reassess patient after multiple doses of analgesics. Goal is to break the pain and see if patient feels comfortable going home.  Currently, there is no signs of severe decompensation clinically. Will continue to monitor closely.  Admission has been considered for this patient, and we will proceed with this request If we are unable to control the pain, we will admit the patient.  Final Clinical Impression(s) / ED Diagnoses Final diagnoses:  Sickle-cell disease with pain (  HCC)    Rx / DC Orders ED Discharge Orders     None

## 2022-06-28 NOTE — Telephone Encounter (Signed)
Patient called, requesting to come to the day hospital due to pain in the back  rated at 10/10. Per provider, the [facility] does not have the capacity to admit the patient. Patient told to continue to take pain medications as prescribed, stay hydrated and if the pain does not subside, patient should go to the ER. Patient notified, verbalized understanding.

## 2022-06-28 NOTE — ED Provider Notes (Signed)
Patient received at signout for sickle cell pain crisis - labs at baseline levels, patient given few rounds of pain medications with improvement of his pain overall, and comfortable going home.   Terald Sleeper, MD 06/28/22 [phone]

## 2022-06-28 NOTE — ED Notes (Signed)
MD at the Bedside. 

## 2022-07-04 ENCOUNTER — Other Ambulatory Visit: Payer: Self-pay | Admitting: Nurse Practitioner

## 2022-07-04 ENCOUNTER — Telehealth: Payer: Self-pay | Admitting: Internal Medicine

## 2022-07-04 ENCOUNTER — Other Ambulatory Visit: Payer: Self-pay | Admitting: Family Medicine

## 2022-07-04 DIAGNOSIS — G894 Chronic pain syndrome: Secondary | ICD-10-CM

## 2022-07-04 MED ORDER — OXYCODONE-ACETAMINOPHEN 10-325 MG PO TABS
1.0000 | ORAL_TABLET | Freq: Four times a day (QID) | ORAL | 0 refills | Status: DC | PRN
Start: 2022-07-08 — End: 2022-07-18

## 2022-07-04 NOTE — Telephone Encounter (Signed)
Caller & Relationship to patient:  MRN #  [id number]   Call Back Number:   Date of Last Office Visit: 06/21/2022     Date of Next Office Visit: 08/23/2022    Medication(s) to be Refilled: Percocet  Preferred Pharmacy:   ** Please notify patient to allow 48-72 hours to process** **Let patient know to contact pharmacy at the end of the day to make sure medication is ready. ** **If patient has not been seen in a year or longer, book an appointment **Advise to use MyChart for refill requests OR to contact their pharmacy

## 2022-07-04 NOTE — Progress Notes (Signed)
Reviewed PDMP substance reporting system prior to prescribing opiate medications. No inconsistencies noted.   Meds ordered this encounter  Medications  . oxyCODONE-acetaminophen (PERCOCET) 10-325 MG tablet    Sig: Take 1 tablet by mouth every 6 (six) hours as needed for pain.    Dispense:  60 tablet    Refill:  0    Order Specific Question:   Supervising Provider    Answer:   JEGEDE, OLUGBEMIGA E [[id number]]     Clemencia Helzer Moore Dandrea Widdowson  APRN, MSN, FNP-C [facility] [address]  [place], [place] [address] [phone]  

## 2022-07-06 ENCOUNTER — Non-Acute Institutional Stay (HOSPITAL_COMMUNITY)
Admission: AD | Admit: 2022-07-06 | Discharge: 2022-07-06 | Disposition: A | Discharge status: Home / Self Care | Payer: 59 | Source: Ambulatory Visit | Attending: Internal Medicine | Admitting: Internal Medicine

## 2022-07-06 ENCOUNTER — Telehealth (HOSPITAL_COMMUNITY): Payer: Self-pay

## 2022-07-06 DIAGNOSIS — D5700 Hb-SS disease with crisis, unspecified: Secondary | ICD-10-CM | POA: Diagnosis present

## 2022-07-06 DIAGNOSIS — D638 Anemia in other chronic diseases classified elsewhere: Secondary | ICD-10-CM | POA: Diagnosis not present

## 2022-07-06 DIAGNOSIS — G894 Chronic pain syndrome: Secondary | ICD-10-CM | POA: Diagnosis not present

## 2022-07-06 DIAGNOSIS — Z79891 Long term (current) use of opiate analgesic: Secondary | ICD-10-CM | POA: Diagnosis not present

## 2022-07-06 LAB — CBC WITH DIFFERENTIAL/PLATELET
Abs Immature Granulocytes: 0.03 10*3/uL (ref 0.00–0.07)
Basophils Absolute: 0 10*3/uL (ref 0.0–0.1)
Basophils Relative: 1 %
Eosinophils Absolute: 0.1 10*3/uL (ref 0.0–0.5)
Eosinophils Relative: 1 %
HCT: 32 % — ABNORMAL LOW (ref 39.0–52.0)
Hemoglobin: 11 g/dL — ABNORMAL LOW (ref 13.0–17.0)
Immature Granulocytes: 0 %
Lymphocytes Relative: 25 %
Lymphs Abs: 2.1 10*3/uL (ref 0.7–4.0)
MCH: 28.6 pg (ref 26.0–34.0)
MCHC: 34.4 g/dL (ref 30.0–36.0)
MCV: 83.3 fL (ref 80.0–100.0)
Monocytes Absolute: 0.7 10*3/uL (ref 0.1–1.0)
Monocytes Relative: 8 %
Neutro Abs: 5.4 10*3/uL (ref 1.7–7.7)
Neutrophils Relative %: 65 %
Platelets: 455 10*3/uL — ABNORMAL HIGH (ref 150–400)
RBC: 3.84 MIL/uL — ABNORMAL LOW (ref 4.22–5.81)
RDW: 19.8 % — ABNORMAL HIGH (ref 11.5–15.5)
WBC: 8.4 10*3/uL (ref 4.0–10.5)
nRBC: 1.2 % — ABNORMAL HIGH (ref 0.0–0.2)

## 2022-07-06 LAB — COMPREHENSIVE METABOLIC PANEL
ALT: 10 U/L (ref 0–44)
AST: 25 U/L (ref 15–41)
Albumin: 4.8 g/dL (ref 3.5–5.0)
Alkaline Phosphatase: 68 U/L (ref 38–126)
Anion gap: 8 (ref 5–15)
BUN: 9 mg/dL (ref 6–20)
CO2: 25 mmol/L (ref 22–32)
Calcium: 9.5 mg/dL (ref 8.9–10.3)
Chloride: 106 mmol/L (ref 98–111)
Creatinine, Ser: 0.61 mg/dL (ref 0.61–1.24)
GFR, Estimated: >60 mL/min (ref >60)
Glucose, Bld: 105 mg/dL — ABNORMAL HIGH (ref 70–99)
Potassium: 3.7 mmol/L (ref 3.5–5.1)
Sodium: 139 mmol/L (ref 135–145)
Total Bilirubin: 4 mg/dL — ABNORMAL HIGH (ref 0.3–1.2)
Total Protein: 9.1 g/dL — ABNORMAL HIGH (ref 6.5–8.1)

## 2022-07-06 LAB — LACTATE DEHYDROGENASE: LDH: 244 U/L — ABNORMAL HIGH (ref 98–192)

## 2022-07-06 MED ORDER — KETOROLAC TROMETHAMINE 15 MG/ML IJ SOLN
15.0000 mg | Freq: Once | INTRAMUSCULAR | Status: AC
  Administered 2022-07-06: 15 mg via INTRAVENOUS
  Filled 2022-07-06: qty 1

## 2022-07-06 MED ORDER — DIPHENHYDRAMINE HCL 25 MG PO CAPS
25.0000 mg | ORAL_CAPSULE | ORAL | Status: DC | PRN
  Administered 2022-07-06: 25 mg via ORAL
  Filled 2022-07-06: qty 1

## 2022-07-06 MED ORDER — SODIUM CHLORIDE 0.9% FLUSH: 9.0000 mL | INTRAVENOUS | Status: DC | PRN

## 2022-07-06 MED ORDER — POLYETHYLENE GLYCOL 3350 17 G PO PACK: 17.0000 g | PACK | Freq: Every day | ORAL | Status: DC | PRN

## 2022-07-06 MED ORDER — ONDANSETRON HCL 4 MG/2ML IJ SOLN
4.0000 mg | Freq: Four times a day (QID) | INTRAMUSCULAR | Status: DC | PRN
  Administered 2022-07-06: 4 mg via INTRAVENOUS
  Filled 2022-07-06: qty 2

## 2022-07-06 MED ORDER — NALOXONE HCL 0.4 MG/ML IJ SOLN: 0.4000 mg | INTRAMUSCULAR | Status: DC | PRN

## 2022-07-06 MED ORDER — HYDROMORPHONE 1 MG/ML IV SOLN
INTRAVENOUS | Status: DC
  Administered 2022-07-06: 11 mg via INTRAVENOUS
  Administered 2022-07-06: 30 mg via INTRAVENOUS
  Filled 2022-07-06: qty 30

## 2022-07-06 MED ORDER — ACETAMINOPHEN 500 MG PO TABS
1000.0000 mg | ORAL_TABLET | Freq: Once | ORAL | Status: AC
  Administered 2022-07-06: 1000 mg via ORAL
  Filled 2022-07-06: qty 2

## 2022-07-06 MED ORDER — SODIUM CHLORIDE 0.45 % IV SOLN: INTRAVENOUS | Status: DC

## 2022-07-06 MED ORDER — SENNOSIDES-DOCUSATE SODIUM 8.6-50 MG PO TABS: 1.0000 | ORAL_TABLET | Freq: Two times a day (BID) | ORAL | Status: DC

## 2022-07-06 NOTE — Telephone Encounter (Signed)
Patient called in. Complains of pain in back rates 9/10. Denied chest pain, abd pain, fever, N/V/D, or priapism. Wants to come in for treatment. Last took morphine 15mg  at 7am and percocet 10mg  at 5am. Pt states he was in the ED a few days ago. Pt states his girlfriend is his transportation to and from center. Pt  denies recent covid exposure or flu like symptoms. Dr. Hyman Hopes made aware, approved pt to be seen in the day hospital. Pt notified, verbalized understanding.

## 2022-07-06 NOTE — Progress Notes (Signed)
Pt admitted to the day hospital by Dr. Hyman Hopes for treatment of sickle cell pain crisis. Pt reported pain back rated a 9/10. Pt given PO Benadryl and Tylenol,IV toradol and zofran, placed on Dilaudid PCA 0.5/10/3/2mg  loading dose, and hydrated with 1/2 NS IV fluids. At discharge patient reported pain a 5/10. Pt declined AVS.Pt alert , oriented and ambulatory at discharge.

## 2022-07-06 NOTE — Discharge Summary (Signed)
Physician Discharge Summary  Yamen Haith ZOX:[id number] DOB: 1990-10-01 DOA: 07/06/2022  PCP: Quentin Angst, MD  Admit date: 07/06/2022  Discharge date: 07/06/2022  Time spent: 30 minutes  Discharge Diagnoses:  Principal Problem:   Sickle cell anemia with pain [facility])   Discharge Condition: Stable  Diet recommendation: Regular  History of present illness:  Timothy Marks is a 32 y.o. male with history of sickle cell disease, chronic pain syndrome, opiate dependence and tolerance, history of anemia of chronic disease who presented to the day hospital today with major complaints of generalized body pain that is consistent with his typical sickle cell pain crisis.  He said his pain has been increasing in intensity over the past few days to the extent that he went to the emergency room on 07/03/2022 for the same complaints, he was treated and discharged after some relief.  Today he took his home pain medications but did not experience any significant relief.  He rated his pain at 10/10 this morning, characterized as constant throbbing and achy.  He denies any fever, cough, chest pain, shortness of breath, nausea, vomiting or diarrhea.  He denies any sick contacts, recent travels or known exposure to COVID-19.  No urinary symptoms.  Hospital Course:  Timothy Marks was admitted to the day hospital with sickle cell painful crisis. Patient was treated with IV fluid, weight based IV Dilaudid PCA, IV Toradol, clinician assisted doses as deemed appropriate, and other adjunct therapies per sickle cell pain management protocol. Timothy Marks showed significant improvement symptomatically, pain improved from 10/10 to 5/10 at the time of discharge. Patient was discharged home in a hemodynamically stable condition. Timothy Marks will follow-up at the clinic as previously scheduled, continue with home medications as per prior to admission.  Discharge Instructions We discussed the need for good hydration, monitoring  of hydration status, avoidance of heat, cold, stress, and infection triggers. We discussed the need to be compliant with taking Hydrea and other home medications. Timothy Marks was reminded of the need to seek medical attention immediately if any symptom of bleeding, anemia, or infection occurs.  Discharge Exam: Vitals:   07/06/22 1234 07/06/22 1446  BP: 113/64 (!) 98/56  Pulse: 77 65  Resp: 11 (!) 9  Temp: 98.4 F (36.9 C) 98.3 F (36.8 C)  SpO2: 96% 97%   General appearance: alert, cooperative and no distress Eyes: conjunctivae/corneas clear. PERRL, EOM's intact. Fundi benign. Neck: no adenopathy, no carotid bruit, no JVD, supple, symmetrical, trachea midline and thyroid not enlarged, symmetric, no tenderness/mass/nodules Back: symmetric, no curvature. ROM normal. No CVA tenderness. Resp: clear to auscultation bilaterally Chest wall: no tenderness Cardio: regular rate and rhythm, S1, S2 normal, no murmur, click, rub or gallop GI: soft, non-tender; bowel sounds normal; no masses,  no organomegaly Extremities: extremities normal, atraumatic, no cyanosis or edema Pulses: 2+ and symmetric Skin: Skin color, texture, turgor normal. No rashes or lesions Neurologic: Grossly normal  Discharge Instructions     Diet - low sodium heart healthy   Complete by: As directed    Increase activity slowly   Complete by: As directed       Allergies as of 07/06/2022       Reactions   Buprenorphine Hcl Hives   Levaquin [levofloxacin] Itching   Meperidine Rash   Morphine Hives   Morphine And Codeine Hives   Tramadol Hives   Vancomycin Itching   Zosyn [piperacillin Sod-tazobactam So] Itching, Rash, Other (See Comments)   Has taken rocephin in the past  Medication List     TAKE these medications    cholecalciferol 25 MCG (1000 UNIT) tablet Commonly known as: VITAMIN D3 Take 1,000 Units by mouth daily.   folic acid 1 MG tablet Commonly known as: FOLVITE Take 1 tablet (1 mg total)  by mouth daily.   gabapentin 300 MG capsule Commonly known as: NEURONTIN TAKE 1 CAPSULE(300 MG) BY MOUTH THREE TIMES DAILY   hydrocortisone 1 % ointment Apply 1 Application topically 2 (two) times daily.   hydroxyurea 500 MG capsule Commonly known as: HYDREA Take 1 capsule (500 mg total) by mouth 2 (two) times daily. May take with food to minimize GI side effects.   hydrOXYzine 25 MG tablet Commonly known as: ATARAX Take 25 mg by mouth daily as needed for anxiety or vomiting.   ibuprofen 800 MG tablet Commonly known as: ADVIL Take 800 mg by mouth every 8 (eight) hours as needed.   mirtazapine 7.5 MG tablet Commonly known as: REMERON Take 7.5 mg by mouth at bedtime as needed (for sleep).   morphine 15 MG 12 hr tablet Commonly known as: MS Contin Take 1 tablet (15 mg total) by mouth every 12 (twelve) hours.   OLANZapine 5 MG tablet Commonly known as: ZYPREXA Take 1 tablet (5 mg total) by mouth at bedtime.   oxyCODONE-acetaminophen 10-325 MG tablet Commonly known as: Percocet Take 1 tablet by mouth every 6 (six) hours as needed for pain. Start taking on: Jul 08, 2022   rivaroxaban 20 MG Tabs tablet Commonly known as: Xarelto Take 1 tablet (20 mg total) by mouth daily with supper.       Allergies  Allergen Reactions   Buprenorphine Hcl Hives   Levaquin [Levofloxacin] Itching   Meperidine Rash   Morphine Hives   Morphine And Codeine Hives   Tramadol Hives   Vancomycin Itching   Zosyn [Piperacillin Sod-Tazobactam So] Itching, Rash and Other (See Comments)    Has taken rocephin in the past    Follow-up Information     Quentin Angst, MD. Schedule an appointment as soon as possible for a visit in 1 week(s).   Specialty: Internal Medicine Contact information: [address] [place] [address] [phone]                 Significant Diagnostic Studies: No results found.  Signed:  Jeanann Lewandowsky MD, MHA, FACP, Constance Goltz,  CPE   07/06/2022, 3:32 PM

## 2022-07-06 NOTE — H&P (Signed)
[facility] History and Physical  Timothy Marks ZOX:[id number] DOB: [birth year] DOA: 07/06/2022  PCP: Quentin Angst, MD   Chief Complaint: Sickle cell pain   HPI: Timothy Marks is a 32 y.o. male with history of sickle cell disease, chronic pain syndrome, opiate dependence and tolerance, history of anemia of chronic disease who presented to the day hospital today with major complaints of generalized body pain that is consistent with his typical sickle cell pain crisis.  He said his pain has been increasing in intensity over the past few days to the extent that he went to the emergency room on 07/03/2022 for the same complaints, he was treated and discharged after some relief.  Today he took his home pain medications but did not experience any significant relief.  He rated his pain at 10/10 this morning, characterized as constant throbbing and achy.  He denies any fever, cough, chest pain, shortness of breath, nausea, vomiting or diarrhea.  He denies any sick contacts, recent travels or known exposure to COVID-19.  No urinary symptoms.  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   Sickle cell anemia (HCC)    Vitamin D deficiency y-6    Past Surgical History:  Procedure Laterality Date   CHOLECYSTECTOMY      Allergies  Allergen Reactions   Buprenorphine  Hcl Hives   Levaquin [Levofloxacin] Itching   Meperidine Rash   Morphine Hives   Morphine And Codeine Hives   Tramadol Hives   Vancomycin Itching   Zosyn [Piperacillin Sod-Tazobactam So] Itching, Rash and Other (See Comments)    Has taken rocephin in the past    Family History  Problem Relation Age of Onset   Sickle cell trait Mother    Sickle cell trait Father       Prior to Admission medications   Medication Sig Start Date End Date Taking? Authorizing Provider  cholecalciferol (VITAMIN D) 25 MCG (1000 UNIT) tablet Take 1,000 Units by mouth daily.    [provider]  folic acid (FOLVITE) 1 MG tablet Take 1 tablet (1 mg total) by mouth daily. 07/05/21   Massie Maroon, FNP  gabapentin (NEURONTIN) 300 MG capsule TAKE 1 CAPSULE(300 MG) BY MOUTH THREE TIMES DAILY 08/24/21   Quentin Angst, MD  hydrocortisone 1 % ointment Apply 1 Application topically 2 (two) times daily. 10/14/21   Quentin Angst, MD  hydroxyurea (HYDREA) 500 MG capsule Take 1 capsule (500 mg total) by mouth 2 (two) times daily. May take with food to minimize GI side effects. 12/21/21   Massie Maroon, FNP  hydrOXYzine (ATARAX) 25 MG tablet Take 25 mg by mouth daily as needed for anxiety or vomiting. 05/19/21   [provider]  ibuprofen (ADVIL) 800 MG tablet Take 800 mg by mouth every 8 (eight) hours as needed.    [provider]  mirtazapine (REMERON) 7.5 MG tablet Take 7.5 mg by mouth at bedtime as needed (for sleep). 05/19/21   [provider]  morphine (MS CONTIN) 15 MG 12 hr tablet Take 1 tablet (15 mg total) by mouth every 12 (twelve) hours. 06/16/22   Massie Maroon, FNP  OLANZapine (ZYPREXA) 5 MG tablet Take 1 tablet (5 mg total) by mouth at bedtime. 05/07/21   Massie Maroon, FNP  oxyCODONE-acetaminophen (PERCOCET) 10-325 MG tablet Take 1 tablet by mouth every 6 (six) hours as needed for pain. 07/08/22   Massie Maroon, FNP  rivaroxaban (XARELTO) 20 MG TABS  tablet Take 1 tablet (20 mg total) by mouth daily with supper. 07/05/21   Massie Maroon, FNP     Physical Exam: There were no vitals filed for this visit.  General: Alert, awake, afebrile, anicteric, not in obvious distress HEENT: Normocephalic and Atraumatic, Mucous membranes pink                PERRLA; EOM intact; No scleral icterus,                 Nares: Patent, Oropharynx: Clear, Fair Dentition                 Neck: FROM, no cervical lymphadenopathy, thyromegaly, carotid bruit or JVD;  CHEST WALL: No tenderness  CHEST: Normal respiration, clear to auscultation bilaterally  HEART: Regular rate and rhythm; no murmurs rubs or gallops  BACK: No kyphosis or scoliosis; no CVA tenderness  ABDOMEN: Positive Bowel Sounds, soft, non-tender; no masses, no organomegaly EXTREMITIES: No cyanosis, clubbing, or edema SKIN:  no rash or ulceration  CNS: Alert and Oriented x 4, Nonfocal exam, CN 2-12 intact  Labs on Admission:  Basic Metabolic Panel: No results for input(s): "NA", "K", "CL", "CO2", "GLUCOSE", "BUN", "CREATININE", "CALCIUM", "MG", "PHOS" in the last 168 hours. Liver Function Tests: No results for input(s): "AST", "ALT", "ALKPHOS", "BILITOT", "PROT", "ALBUMIN" in the last 168 hours. No results for input(s): "LIPASE", "AMYLASE" in the last 168 hours. No results for input(s): "AMMONIA" in the last 168 hours. CBC: No results for input(s): "WBC", "NEUTROABS", "HGB", "HCT", "MCV", "PLT" in the last 168 hours. Cardiac Enzymes: No results for input(s): "CKTOTAL", "CKMB", "CKMBINDEX", "TROPONINI" in the last 168 hours.  BNP (last 3 results) No results for input(s): "BNP" in the last 8760 hours.  ProBNP (last 3 results) No results for input(s): "PROBNP" in the last 8760 hours.  CBG: No results for input(s): "GLUCAP" in the last 168 hours.   Assessment/Plan Principal Problem:   Sickle cell anemia with pain (HCC)  Admits to the Day Hospital for extended observation IVF 0.45%  Saline @ 125 mls/hour Weight based Dilaudid PCA started within 30 minutes of admission IV Toradol 15 mg x 1 dose Acetaminophen 1000 mg x 1 dose Labs: CBCD, CMP, Retic Count and LDH Monitor vitals very closely, Re-evaluate pain scale every hour 2 L of Oxygen by  Patient will be re-evaluated for pain in the context of function and relationship to baseline as care progresses. If no significant relieve from pain (remains above 5/10) will transfer patient to inpatient services for further evaluation and management  Code Status: Full  Family Communication: None  DVT Prophylaxis: Ambulate as tolerated   Time spent: 35 Minutes  Jeanann Lewandowsky, MD, MHA, FACP, FAAP, CPE  If 7PM-7AM, please contact night-coverage [email or web address] 07/06/2022, 10:23 AM

## 2022-07-14 ENCOUNTER — Other Ambulatory Visit: Payer: Self-pay | Admitting: Family Medicine

## 2022-07-14 DIAGNOSIS — G894 Chronic pain syndrome: Secondary | ICD-10-CM

## 2022-07-14 DIAGNOSIS — D571 Sickle-cell disease without crisis: Secondary | ICD-10-CM

## 2022-07-14 MED ORDER — MORPHINE SULFATE ER 15 MG PO TBCR
15.0000 mg | EXTENDED_RELEASE_TABLET | Freq: Two times a day (BID) | ORAL | 0 refills | Status: DC
Start: 2022-07-17 — End: 2022-08-11

## 2022-07-14 NOTE — Telephone Encounter (Signed)
Reviewed PDMP substance reporting system prior to prescribing opiate medications. No inconsistencies noted.  Meds ordered this encounter  Medications   morphine (MS CONTIN) 15 MG 12 hr tablet    Sig: Take 1 tablet (15 mg total) by mouth every 12 (twelve) hours.    Dispense:  60 tablet    Refill:  0    Order Specific Question:   Supervising Provider    Answer:   JEGEDE, OLUGBEMIGA E [[id number]]   Sakari Alkhatib Moore Herb Beltre  APRN, MSN, FNP-C [facility] [address]  [place], [place] [address] [phone]  

## 2022-07-14 NOTE — Telephone Encounter (Signed)
From: Gertie Baron To: Office of Julianne Handler, [place] Sent: 07/14/2022 10:21 AM EDT Subject: Medication Renewal Request  Refills have been requested for the following medications:   morphine (MS CONTIN) 15 MG 12 hr tablet [Timothy Marks]  Patient Comment: Need a refill   Preferred pharmacy: [facility] [phone] - [place], [place] - [address] Delivery method: [employer]

## 2022-07-15 ENCOUNTER — Telehealth (HOSPITAL_COMMUNITY): Payer: Self-pay | Admitting: *Deleted

## 2022-07-15 ENCOUNTER — Non-Acute Institutional Stay (HOSPITAL_COMMUNITY)
Admission: AD | Admit: 2022-07-15 | Discharge: 2022-07-15 | Disposition: A | Discharge status: Home / Self Care | Payer: 59 | Source: Ambulatory Visit | Attending: Internal Medicine | Admitting: Internal Medicine

## 2022-07-15 DIAGNOSIS — Z87891 Personal history of nicotine dependence: Secondary | ICD-10-CM | POA: Diagnosis not present

## 2022-07-15 DIAGNOSIS — Z79891 Long term (current) use of opiate analgesic: Secondary | ICD-10-CM | POA: Insufficient documentation

## 2022-07-15 DIAGNOSIS — G894 Chronic pain syndrome: Secondary | ICD-10-CM | POA: Insufficient documentation

## 2022-07-15 DIAGNOSIS — D638 Anemia in other chronic diseases classified elsewhere: Secondary | ICD-10-CM | POA: Diagnosis not present

## 2022-07-15 DIAGNOSIS — D5700 Hb-SS disease with crisis, unspecified: Secondary | ICD-10-CM | POA: Diagnosis present

## 2022-07-15 LAB — CBC WITH DIFFERENTIAL/PLATELET
Abs Immature Granulocytes: 0.05 10*3/uL (ref 0.00–0.07)
Basophils Absolute: 0 10*3/uL (ref 0.0–0.1)
Basophils Relative: 0 %
Eosinophils Absolute: 0.3 10*3/uL (ref 0.0–0.5)
Eosinophils Relative: 3 %
HCT: 27.8 % — ABNORMAL LOW (ref 39.0–52.0)
Hemoglobin: 9.6 g/dL — ABNORMAL LOW (ref 13.0–17.0)
Immature Granulocytes: 1 %
Lymphocytes Relative: 17 %
Lymphs Abs: 1.8 10*3/uL (ref 0.7–4.0)
MCH: 29.6 pg (ref 26.0–34.0)
MCHC: 34.5 g/dL (ref 30.0–36.0)
MCV: 85.8 fL (ref 80.0–100.0)
Monocytes Absolute: 1 10*3/uL (ref 0.1–1.0)
Monocytes Relative: 10 %
Neutro Abs: 7.2 10*3/uL (ref 1.7–7.7)
Neutrophils Relative %: 69 %
Platelets: 401 10*3/uL — ABNORMAL HIGH (ref 150–400)
RBC: 3.24 MIL/uL — ABNORMAL LOW (ref 4.22–5.81)
RDW: 19 % — ABNORMAL HIGH (ref 11.5–15.5)
WBC: 10.3 10*3/uL (ref 4.0–10.5)
nRBC: 1.6 % — ABNORMAL HIGH (ref 0.0–0.2)

## 2022-07-15 LAB — RETICULOCYTES
Immature Retic Fract: 36.3 % — ABNORMAL HIGH (ref 2.3–15.9)
RBC.: 3.27 MIL/uL — ABNORMAL LOW (ref 4.22–5.81)
Retic Count, Absolute: 323.1 10*3/uL — ABNORMAL HIGH (ref 19.0–186.0)
Retic Ct Pct: 9.9 % — ABNORMAL HIGH (ref 0.4–3.1)

## 2022-07-15 MED ORDER — KETOROLAC TROMETHAMINE 30 MG/ML IJ SOLN
15.0000 mg | Freq: Once | INTRAMUSCULAR | Status: AC
  Administered 2022-07-15: 15 mg via INTRAVENOUS
  Filled 2022-07-15: qty 1

## 2022-07-15 MED ORDER — ACETAMINOPHEN 500 MG PO TABS
1000.0000 mg | ORAL_TABLET | Freq: Once | ORAL | Status: AC
  Administered 2022-07-15: 1000 mg via ORAL
  Filled 2022-07-15: qty 2

## 2022-07-15 MED ORDER — SODIUM CHLORIDE 0.9% FLUSH: 9.0000 mL | INTRAVENOUS | Status: DC | PRN

## 2022-07-15 MED ORDER — DIPHENHYDRAMINE HCL 25 MG PO CAPS
25.0000 mg | ORAL_CAPSULE | ORAL | Status: DC | PRN
  Administered 2022-07-15: 25 mg via ORAL
  Filled 2022-07-15: qty 1

## 2022-07-15 MED ORDER — SODIUM CHLORIDE 0.45 % IV SOLN: INTRAVENOUS | Status: DC

## 2022-07-15 MED ORDER — ONDANSETRON HCL 4 MG/2ML IJ SOLN
4.0000 mg | Freq: Four times a day (QID) | INTRAMUSCULAR | Status: DC | PRN
  Administered 2022-07-15: 4 mg via INTRAVENOUS
  Filled 2022-07-15: qty 2

## 2022-07-15 MED ORDER — NALOXONE HCL 0.4 MG/ML IJ SOLN: 0.4000 mg | INTRAMUSCULAR | Status: DC | PRN

## 2022-07-15 MED ORDER — HYDROMORPHONE 1 MG/ML IV SOLN
INTRAVENOUS | Status: DC
  Administered 2022-07-15: 8 mg via INTRAVENOUS
  Administered 2022-07-15: 30 mg via INTRAVENOUS
  Filled 2022-07-15: qty 30

## 2022-07-15 NOTE — Telephone Encounter (Signed)
Patient called requesting to come to the day hospital for sickle cell pain. Patient reports back pain rated 9/10. Reports taking MS Contin last night and Percocet this morning at 6:00 am. COVID-19 screening done and patient denies all symptoms and exposures. Denies fever, chest pain, nausea, vomiting, diarrhea, abdominal pain and priapism. Admits to having transportation without driving self. Per patient, his girlfriend will be his transportation. [place], FNP notified. Patient can come to the day hospital for pain management. Patient advised and expresses an understanding.

## 2022-07-15 NOTE — H&P (Signed)
[facility] History and Physical   Date: 07/15/2022  Patient name: Timothy Marks Medical record number: [id number] Date of birth: 08-06-1990 Age: 32 y.o. Gender: male PCP: Quentin Angst, MD  Attending physician: Quentin Angst, MD  Chief Complaint: Sickle cell pain   History of Present Illness: Timothy Marks is a 32 year old male with a medical history significant for sickle cell disease, chronic pain syndrome, opiate dependence and tolerance, oppositional defiance, marijuana use, and anemia of chronic disease presents with complaints of low back pain that is consistent with his typical pain crisis.  Patient states the pain intensity has been elevated over the past several days and unrelieved by his home medications.  He states that MS Contin has been ineffective with pain control.  He is requesting to increase frequency of his Percocet.  Current pain intensity is 9/10, he last had Percocet this a.m. and MS Contin on last night without very much relief.  Patient denies any fever, chills, chest pain, urinary symptoms, nausea, vomiting, or diarrhea.  Meds: Medications Prior to Admission  Medication Sig Dispense Refill Last Dose   cholecalciferol (VITAMIN D) 25 MCG (1000 UNIT) tablet Take 1,000 Units by mouth daily.      folic acid (FOLVITE) 1 MG tablet Take 1 tablet (1 mg total) by mouth daily. 90 tablet 2    gabapentin (NEURONTIN) 300 MG capsule TAKE 1 CAPSULE(300 MG) BY MOUTH THREE TIMES DAILY 90 capsule 5    hydrocortisone 1 % ointment Apply 1 Application topically 2 (two) times daily. 30 g 0    hydroxyurea (HYDREA) 500 MG capsule Take 1 capsule (500 mg total) by mouth 2 (two) times daily. May take with food to minimize GI side effects. 180 capsule 2    hydrOXYzine (ATARAX) 25 MG tablet Take 25 mg by mouth daily as needed for anxiety or vomiting.      ibuprofen (ADVIL) 800 MG tablet Take 800 mg by mouth every 8 (eight) hours as needed.      mirtazapine (REMERON)  7.5 MG tablet Take 7.5 mg by mouth at bedtime as needed (for sleep).      [START ON 07/17/2022] morphine (MS CONTIN) 15 MG 12 hr tablet Take 1 tablet (15 mg total) by mouth every 12 (twelve) hours. 60 tablet 0    OLANZapine (ZYPREXA) 5 MG tablet Take 1 tablet (5 mg total) by mouth at bedtime. 30 tablet 2    oxyCODONE-acetaminophen (PERCOCET) 10-325 MG tablet Take 1 tablet by mouth every 6 (six) hours as needed for pain. 60 tablet 0    rivaroxaban (XARELTO) 20 MG TABS tablet Take 1 tablet (20 mg total) by mouth daily with supper. 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   Sickle cell anemia (HCC)    Vitamin D deficiency y-6   Past Surgical History:  Procedure Laterality Date   CHOLECYSTECTOMY     Family History  Problem Relation Age of Onset   Sickle cell trait Mother    Sickle cell trait Father    Social History   Socioeconomic History   Marital status: Single    Spouse name: Not on file   Number of children: Not on file   Years of  education: Not on file   Highest education level: Not on file  Occupational History   Not on file  Tobacco Use   Smoking status: Former    Types: Cigarettes   Smokeless tobacco: Never  Vaping Use   Vaping Use: Never used  Substance and Sexual Activity   Alcohol use: No   Drug use: Yes    Frequency: 7.0 times per week    Types: Marijuana   Sexual activity: Yes  Other Topics Concern   Not on file  Social History Narrative   Not on file   Social Determinants of Health   Financial Resource Strain: Low Risk  (04/21/2022)   Overall Financial Resource Strain (CARDIA)    Difficulty of Paying Living Expenses: Not hard at all   Food Insecurity: No Food Insecurity (04/21/2022)   Hunger Vital Sign    Worried About Running Out of Food in the Last Year: Never true    Ran Out of Food in the Last Year: Never true  Transportation Needs: No Transportation Needs (04/21/2022)   PRAPARE - Administrator, Civil Service (Medical): No    Lack of Transportation (Non-Medical): No  Physical Activity: Sufficiently Active (04/21/2022)   Exercise Vital Sign    Days of Exercise per Week: 5 days    Minutes of Exercise per Session: 60 min  Stress: No Stress Concern Present (04/21/2022)   [employer] of Occupational Health - Occupational Stress Questionnaire    Feeling of Stress : Not at all  Social Connections: Socially Isolated (04/21/2022)   Social Connection and Isolation Panel [NHANES]    Frequency of Communication with Friends and Family: More than three times a week    Frequency of Social Gatherings with Friends and Family: More than three times a week    Attends Religious Services: Never    Database administrator or Organizations: No    Attends Banker Meetings: Never    Marital Status: Never married  Intimate Partner Violence: Not At Risk (04/21/2022)   Humiliation, Afraid, Rape, and Kick questionnaire    Fear of Current or Ex-Partner: No    Emotionally Abused: No    Physically Abused: No    Sexually Abused: No   Review of Systems  Constitutional: Negative.   HENT: Negative.    Respiratory: Negative.    Cardiovascular: Negative.   Gastrointestinal: Negative.   Genitourinary: Negative.   Musculoskeletal:  Positive for back pain and joint pain.  Skin: Negative.   Neurological: Negative.   Psychiatric/Behavioral: Negative.      Physical Exam: There were no vitals taken for this visit. Physical Exam Constitutional:      Appearance: Normal appearance.  Eyes:     Pupils: Pupils are equal, round, and reactive to light.  Cardiovascular:     Rate and Rhythm: Normal rate and regular rhythm.      Pulses: Normal pulses.  Pulmonary:     Effort: Pulmonary effort is normal.  Abdominal:     General: Bowel sounds are normal.  Skin:    General: Skin is warm.  Neurological:     General: No focal deficit present.     Mental Status: He is alert. Mental status is at baseline.  Psychiatric:        Mood and Affect: Mood normal.        Behavior: Behavior normal.        Thought Content: Thought content normal.        Judgment: Judgment normal.  Lab results: No results found for this or any previous visit (from the past 24 hour(s)).  Imaging results:  No results found.   Assessment & Plan: Patient admitted to sickle cell day infusion center for management of pain crisis.  Patient is opiate tolerant Initiate IV dilaudid PCA.  IV fluids, 0.45% saline@100  ml/hr Toradol 15 mg IV times one dose Tylenol 1000 mg by mouth times one dose Review CBC with differential, complete metabolic panel, and reticulocytes as results become available. Pain intensity will be reevaluated in context of functioning and relationship to baseline as care progresses If pain intensity remains elevated and/or sudden change in hemodynamic stability transition to inpatient services for higher level of care.      [name]  APRN, MSN, FNP-C Patient Care [facility] [address] [place], [place] [address] [phone]  07/15/2022, 9:27 AM

## 2022-07-15 NOTE — Progress Notes (Signed)
Pt admitted to day hospital today for sickle cell pain treatment. On arrival, pt rates 9/10 pain to bilateral legs and back. Pt received Dilaudid PCA, IV Zofran, IV Toradol, and hydrated with IV fluids via PIV. At discharge, pt rates pain 5/10. AVS offered, but pt declined. Pt is alert, oriented, and ambulatory at discharge.

## 2022-07-18 ENCOUNTER — Other Ambulatory Visit: Payer: Self-pay | Admitting: Family Medicine

## 2022-07-18 ENCOUNTER — Other Ambulatory Visit: Payer: Self-pay

## 2022-07-18 DIAGNOSIS — G894 Chronic pain syndrome: Secondary | ICD-10-CM

## 2022-07-18 MED ORDER — OXYCODONE-ACETAMINOPHEN 10-325 MG PO TABS
1.0000 | ORAL_TABLET | Freq: Four times a day (QID) | ORAL | 0 refills | Status: DC | PRN
Start: 2022-07-23 — End: 2022-07-29

## 2022-07-18 NOTE — Discharge Summary (Signed)
[facility] Discharge Summary   Patient ID: Timothy Marks MRN: [id number] DOB/AGE: 21-Aug-1990 32 y.o.  Admit date: 07/15/2022 Discharge date: 07/15/2022  Primary Care Physician:  Quentin Angst, MD  Admission Diagnoses:  Principal Problem:   Sickle cell pain crisis [facility])    Discharge Medications:  Allergies as of 07/15/2022       Reactions   Buprenorphine Hcl Hives   Levaquin [levofloxacin] Itching   Meperidine Rash   Morphine Hives   Morphine And Codeine Hives   Tramadol Hives   Vancomycin Itching   Zosyn [piperacillin Sod-tazobactam So] Itching, Rash, Other (See Comments)   Has taken rocephin in the past        Medication List     ASK your doctor about these medications    cholecalciferol 25 MCG (1000 UNIT) tablet Commonly known as: VITAMIN D3 Take 1,000 Units by mouth daily.   folic acid 1 MG tablet Commonly known as: FOLVITE Take 1 tablet (1 mg total) by mouth daily.   gabapentin 300 MG capsule Commonly known as: NEURONTIN TAKE 1 CAPSULE(300 MG) BY MOUTH THREE TIMES DAILY   hydrocortisone 1 % ointment Apply 1 Application topically 2 (two) times daily.   hydroxyurea 500 MG capsule Commonly known as: HYDREA Take 1 capsule (500 mg total) by mouth 2 (two) times daily. May take with food to minimize GI side effects.   hydrOXYzine 25 MG tablet Commonly known as: ATARAX Take 25 mg by mouth daily as needed for anxiety or vomiting.   ibuprofen 800 MG tablet Commonly known as: ADVIL Take 800 mg by mouth every 8 (eight) hours as needed.   mirtazapine 7.5 MG tablet Commonly known as: REMERON Take 7.5 mg by mouth at bedtime as needed (for sleep).   morphine 15 MG 12 hr tablet Commonly known as: MS Contin Take 1 tablet (15 mg total) by mouth every 12 (twelve) hours.   OLANZapine 5 MG tablet Commonly known as: ZYPREXA Take 1 tablet (5 mg total) by mouth at bedtime.   oxyCODONE-acetaminophen 10-325 MG tablet Commonly known as:  Percocet Take 1 tablet by mouth every 6 (six) hours as needed for pain.   rivaroxaban 20 MG Tabs tablet Commonly known as: Xarelto Take 1 tablet (20 mg total) by mouth daily with supper.         Consults:  None  Significant Diagnostic Studies:  No results found.   History of Present Illness: Timothy Marks is a 32 year old male with a medical history significant for sickle cell disease, chronic pain syndrome, opiate dependence and tolerance, oppositional defiance, marijuana use, and anemia of chronic disease presents with complaints of low back pain that is consistent with his typical pain crisis.  Patient states the pain intensity has been elevated over the past several days and unrelieved by his home medications.  He states that MS Contin has been ineffective with pain control.  He is requesting to increase frequency of his Percocet.  Current pain intensity is 9/10, he last had Percocet this a.m. and MS Contin on last night without very much relief.  Patient denies any fever, chills, chest pain, urinary symptoms, nausea, vomiting, or diarrhea.   [facility] Course: Patient admitted to sickle cell day infusion clinic for management of pain crisis.  Reviewed labs, consistent with his baseline.  Pain managed with IV Dilaudid PCA, IV fluids, IV Toradol, and Tylenol. Pain intensity decreased throughout admission and patient is requesting discharge home.  He is alert, oriented,  and ambulating without assistance.  He will discharge in a hemodynamically stable condition. Patient advised to make an appointment with PCP for medication management.  Of note, patient's Percocet frequency will not be increased at this time.  No changes in MS Contin dosage, patient states that MS Contin is essentially ineffective, will discontinue.  Discharge instructions: Resume all home medications.   Follow up with PCP as previously  scheduled.   Discussed the importance of drinking 64 ounces of  water daily, dehydration of red blood cells may lead further sickling.   Avoid all stressors that precipitate sickle cell pain crisis.     The patient was given clear instructions to go to ER or return to medical center if symptoms do not improve, worsen or new problems develop.   Physical Exam at Discharge:  BP 109/67 (BP Location: Left Arm)   Pulse 64   Temp 98.2 F (36.8 C) (Temporal)   Resp 10   SpO2 95%   Physical Exam Constitutional:      Appearance: Normal appearance.  Eyes:     Pupils: Pupils are equal, round, and reactive to light.  Cardiovascular:     Rate and Rhythm: Normal rate and regular rhythm.  Pulmonary:     Effort: Pulmonary effort is normal.  Musculoskeletal:        General: Normal range of motion.  Skin:    General: Skin is warm.  Neurological:     General: No focal deficit present.     Mental Status: He is alert. Mental status is at baseline.  Psychiatric:        Mood and Affect: Mood normal.        Behavior: Behavior normal.        Thought Content: Thought content normal.        Judgment: Judgment normal.       Disposition at Discharge: Discharge disposition: 01-Home or Self Care       Discharge Orders:   Condition at Discharge:   Stable  Time spent on Discharge:  Greater than 30 minutes.  Signed: [name]  APRN, MSN, FNP-C Patient Care [facility] [address] [place], [place] [address] [phone]  07/18/2022, 9:53 AM

## 2022-07-18 NOTE — Telephone Encounter (Signed)
From: Gertie Baron To: Office of Julianne Handler, [place] Sent: 07/18/2022 2:45 AM EDT Subject: Medication Renewal Request  Refills have been requested for the following medications:   oxyCODONE-acetaminophen (PERCOCET) 10-325 MG tablet [Lachina Hollis]  Patient Comment: Need a refill its due tomorrow   Preferred pharmacy: [facility] [phone] - [place], [place] - [address] Delivery method: [employer]

## 2022-07-18 NOTE — Telephone Encounter (Signed)
Please advise KH 

## 2022-07-18 NOTE — Progress Notes (Signed)
Reviewed PDMP substance reporting system prior to prescribing opiate medications. No inconsistencies noted.   Meds ordered this encounter  Medications  . oxyCODONE-acetaminophen (PERCOCET) 10-325 MG tablet    Sig: Take 1 tablet by mouth every 6 (six) hours as needed for pain.    Dispense:  60 tablet    Refill:  0    Order Specific Question:   Supervising Provider    Answer:   JEGEDE, OLUGBEMIGA E [[id number]]     Monty Spicher Moore Jahni Nazar  APRN, MSN, FNP-C Patient Care [facility] [address]  [place], [place] [address] [phone]  

## 2022-07-18 NOTE — Telephone Encounter (Signed)
Caller & Relationship to patient:  MRN #  [id number]   Call Back Number:   Date of Last Office Visit: 07/14/2022     Date of Next Office Visit: 08/23/2022    Medication(s) to be Refilled: percocet   Preferred Pharmacy:   ** Please notify patient to allow 48-72 hours to process** **Let patient know to contact pharmacy at the end of the day to make sure medication is ready. ** **If patient has not been seen in a year or longer, book an appointment **Advise to use MyChart for refill requests OR to contact their pharmacy

## 2022-07-29 ENCOUNTER — Telehealth (HOSPITAL_COMMUNITY): Payer: Self-pay

## 2022-07-29 ENCOUNTER — Non-Acute Institutional Stay (HOSPITAL_COMMUNITY)
Admission: AD | Admit: 2022-07-29 | Discharge: 2022-07-29 | Disposition: A | Discharge status: Home / Self Care | Payer: 59 | Source: Ambulatory Visit | Attending: Internal Medicine | Admitting: Internal Medicine

## 2022-07-29 DIAGNOSIS — M545 Low back pain, unspecified: Secondary | ICD-10-CM | POA: Diagnosis not present

## 2022-07-29 DIAGNOSIS — D638 Anemia in other chronic diseases classified elsewhere: Secondary | ICD-10-CM | POA: Diagnosis not present

## 2022-07-29 DIAGNOSIS — G894 Chronic pain syndrome: Secondary | ICD-10-CM

## 2022-07-29 DIAGNOSIS — D5700 Hb-SS disease with crisis, unspecified: Secondary | ICD-10-CM | POA: Diagnosis present

## 2022-07-29 DIAGNOSIS — F1120 Opioid dependence, uncomplicated: Secondary | ICD-10-CM | POA: Diagnosis not present

## 2022-07-29 DIAGNOSIS — Z87891 Personal history of nicotine dependence: Secondary | ICD-10-CM | POA: Insufficient documentation

## 2022-07-29 LAB — CBC WITH DIFFERENTIAL/PLATELET
Abs Immature Granulocytes: 0.05 10*3/uL (ref 0.00–0.07)
Basophils Absolute: 0 10*3/uL (ref 0.0–0.1)
Basophils Relative: 0 %
Eosinophils Absolute: 0.1 10*3/uL (ref 0.0–0.5)
Eosinophils Relative: 1 %
HCT: 24.4 % — ABNORMAL LOW (ref 39.0–52.0)
Hemoglobin: 8.3 g/dL — ABNORMAL LOW (ref 13.0–17.0)
Immature Granulocytes: 0 %
Lymphocytes Relative: 14 %
Lymphs Abs: 1.6 10*3/uL (ref 0.7–4.0)
MCH: 29.3 pg (ref 26.0–34.0)
MCHC: 34 g/dL (ref 30.0–36.0)
MCV: 86.2 fL (ref 80.0–100.0)
Monocytes Absolute: 1.4 10*3/uL — ABNORMAL HIGH (ref 0.1–1.0)
Monocytes Relative: 12 %
Neutro Abs: 8.4 10*3/uL — ABNORMAL HIGH (ref 1.7–7.7)
Neutrophils Relative %: 73 %
Platelets: 448 10*3/uL — ABNORMAL HIGH (ref 150–400)
RBC: 2.83 MIL/uL — ABNORMAL LOW (ref 4.22–5.81)
RDW: 17.7 % — ABNORMAL HIGH (ref 11.5–15.5)
WBC: 11.5 10*3/uL — ABNORMAL HIGH (ref 4.0–10.5)
nRBC: 0.9 % — ABNORMAL HIGH (ref 0.0–0.2)

## 2022-07-29 LAB — RETICULOCYTES
Immature Retic Fract: 40.9 % — ABNORMAL HIGH (ref 2.3–15.9)
RBC.: 2.8 MIL/uL — ABNORMAL LOW (ref 4.22–5.81)
Retic Count, Absolute: 232 10*3/uL — ABNORMAL HIGH (ref 19.0–186.0)
Retic Ct Pct: 8.3 % — ABNORMAL HIGH (ref 0.4–3.1)

## 2022-07-29 LAB — COMPREHENSIVE METABOLIC PANEL
ALT: 12 U/L (ref 0–44)
AST: 28 U/L (ref 15–41)
Albumin: 4 g/dL (ref 3.5–5.0)
Alkaline Phosphatase: 56 U/L (ref 38–126)
Anion gap: 7 (ref 5–15)
BUN: 12 mg/dL (ref 6–20)
CO2: 25 mmol/L (ref 22–32)
Calcium: 8.7 mg/dL — ABNORMAL LOW (ref 8.9–10.3)
Chloride: 101 mmol/L (ref 98–111)
Creatinine, Ser: 0.62 mg/dL (ref 0.61–1.24)
GFR, Estimated: >60 mL/min (ref >60)
Glucose, Bld: 95 mg/dL (ref 70–99)
Potassium: 3.5 mmol/L (ref 3.5–5.1)
Sodium: 133 mmol/L — ABNORMAL LOW (ref 135–145)
Total Bilirubin: 4.1 mg/dL — ABNORMAL HIGH (ref 0.3–1.2)
Total Protein: 8 g/dL (ref 6.5–8.1)

## 2022-07-29 MED ORDER — DIPHENHYDRAMINE HCL 25 MG PO CAPS
25.0000 mg | ORAL_CAPSULE | ORAL | Status: DC | PRN
  Administered 2022-07-29 (×2): 25 mg via ORAL
  Filled 2022-07-29 (×2): qty 1

## 2022-07-29 MED ORDER — ONDANSETRON HCL 4 MG/2ML IJ SOLN
4.0000 mg | Freq: Four times a day (QID) | INTRAMUSCULAR | Status: DC | PRN
  Administered 2022-07-29: 4 mg via INTRAVENOUS
  Filled 2022-07-29 (×2): qty 2

## 2022-07-29 MED ORDER — HYDROMORPHONE 1 MG/ML IV SOLN
INTRAVENOUS | Status: DC
  Administered 2022-07-29: 30 mg via INTRAVENOUS
  Administered 2022-07-29: 10 mg via INTRAVENOUS
  Filled 2022-07-29: qty 30

## 2022-07-29 MED ORDER — ACETAMINOPHEN 500 MG PO TABS
1000.0000 mg | ORAL_TABLET | Freq: Once | ORAL | Status: AC
  Administered 2022-07-29: 1000 mg via ORAL
  Filled 2022-07-29: qty 2

## 2022-07-29 MED ORDER — SODIUM CHLORIDE 0.45 % IV SOLN: INTRAVENOUS | Status: DC

## 2022-07-29 MED ORDER — NALOXONE HCL 0.4 MG/ML IJ SOLN: 0.4000 mg | INTRAMUSCULAR | Status: DC | PRN

## 2022-07-29 MED ORDER — KETOROLAC TROMETHAMINE 30 MG/ML IJ SOLN
15.0000 mg | Freq: Once | INTRAMUSCULAR | Status: AC
  Administered 2022-07-29: 15 mg via INTRAVENOUS
  Filled 2022-07-29: qty 1

## 2022-07-29 MED ORDER — OXYCODONE-ACETAMINOPHEN 10-325 MG PO TABS
1.0000 | ORAL_TABLET | Freq: Four times a day (QID) | ORAL | 0 refills | Status: DC | PRN
Start: 2022-08-06 — End: 2022-08-15

## 2022-07-29 MED ORDER — PROMETHAZINE HCL 25 MG PO TABS
25.0000 mg | ORAL_TABLET | Freq: Once | ORAL | Status: AC
  Administered 2022-07-29: 25 mg via ORAL
  Filled 2022-07-29: qty 1

## 2022-07-29 MED ORDER — SODIUM CHLORIDE 0.9% FLUSH: 9.0000 mL | INTRAVENOUS | Status: DC | PRN

## 2022-07-29 NOTE — Progress Notes (Signed)
Patient admitted to the day hospital for sickle cell pain. Initially, patient reported generalized pain rated 9/10. For pain management, patient placed on Sickle Cell Dose Dilaudid PCA, given 15 mg IV Toradol, Tylenol and hydrated with IV fluids. At discharge, patient rated pain at 6/10. Vital signs stable. AVS offered but patient refused. Patient alert, oriented and ambulatory at discharge.

## 2022-07-29 NOTE — Discharge Summary (Signed)
[facility] Discharge Summary   Patient ID: Timothy Marks MRN: [id number] DOB/AGE: 1990-09-03 32 y.o.  Admit date: 07/29/2022 Discharge date: 07/29/2022  Primary Care Physician:  Quentin Angst, MD  Admission Diagnoses:  Principal Problem:   Sickle cell pain crisis [facility])   Discharge Medications:  Allergies as of 07/29/2022       Reactions   Buprenorphine Hcl Hives   Levaquin [levofloxacin] Itching   Meperidine Rash   Morphine Hives   Morphine And Codeine Hives   Tramadol Hives   Vancomycin Itching   Zosyn [piperacillin Sod-tazobactam So] Itching, Rash, Other (See Comments)   Has taken rocephin in the past        Medication List     TAKE these medications    cholecalciferol 25 MCG (1000 UNIT) tablet Commonly known as: VITAMIN D3 Take 1,000 Units by mouth daily.   folic acid 1 MG tablet Commonly known as: FOLVITE Take 1 tablet (1 mg total) by mouth daily.   gabapentin 300 MG capsule Commonly known as: NEURONTIN TAKE 1 CAPSULE(300 MG) BY MOUTH THREE TIMES DAILY   hydrocortisone 1 % ointment Apply 1 Application topically 2 (two) times daily.   hydroxyurea 500 MG capsule Commonly known as: HYDREA Take 1 capsule (500 mg total) by mouth 2 (two) times daily. May take with food to minimize GI side effects.   hydrOXYzine 25 MG tablet Commonly known as: ATARAX Take 25 mg by mouth daily as needed for anxiety or vomiting.   ibuprofen 800 MG tablet Commonly known as: ADVIL Take 800 mg by mouth every 8 (eight) hours as needed.   mirtazapine 7.5 MG tablet Commonly known as: REMERON Take 7.5 mg by mouth at bedtime as needed (for sleep).   morphine 15 MG 12 hr tablet Commonly known as: MS Contin Take 1 tablet (15 mg total) by mouth every 12 (twelve) hours.   OLANZapine 5 MG tablet Commonly known as: ZYPREXA Take 1 tablet (5 mg total) by mouth at bedtime.   oxyCODONE-acetaminophen 10-325 MG tablet Commonly known as: Percocet Take 1  tablet by mouth every 6 (six) hours as needed for pain. Start taking on: August 06, 2022 What changed: These instructions start on August 06, 2022. If you are unsure what to do until then, ask your doctor or other care provider.   rivaroxaban 20 MG Tabs tablet Commonly known as: Xarelto Take 1 tablet (20 mg total) by mouth daily with supper.         Consults:  None  Significant Diagnostic Studies:  No results found.  History of Present Illness: Timothy Marks is a 32 year old male with a medical history significant for sickle cell disease, chronic pain syndrome, opiate dependence and tolerance, and anemia of chronic disease that presents with complaints of low back pain.  Low back pain is consistent with his previous sickle cell pain crisis.  He states the pain intensity has been elevated over the past 3 days and has been unrelieved by his home medications.  Patient claims that he has been unable to take his MS Contin due to increased itching, despite being on this medication for greater than 2 years.  He currently rates pain as 10/10, constant, and aching.  He denies headache, fever, chills, chest pain, or shortness of breath.  No urinary symptoms, nausea, vomiting, or diarrhea. [facility] Course: Patient admitted to sickle cell day infusion clinic for management of pain crisis.  Reviewed labs, consistent with baseline. Pain managed with IV Dilaudid  PCA, IV fluids, IV Toradol, and Tylenol. Patient's pain relief throughout admission.  He is requesting discharge home.  He is alert, oriented, and ambulating without assistance.  Patient will discharge home in a hemodynamically stable condition.  Discharge instructions: Resume all home medications.   Follow up with PCP as previously  scheduled.   Discussed the importance of drinking 64 ounces of water daily, dehydration of red blood cells may lead further sickling.   Avoid all stressors that precipitate sickle cell pain  crisis.     The patient was given clear instructions to go to ER or return to medical center if symptoms do not improve, worsen or new problems develop.   Physical Exam at Discharge:  BP 110/60 (BP Location: Left Arm)   Pulse 65   Temp 97.7 F (36.5 C) (Temporal)   Resp 12   SpO2 97%  Physical Exam Eyes:     Pupils: Pupils are equal, round, and reactive to light.  Cardiovascular:     Rate and Rhythm: Normal rate and regular rhythm.  Pulmonary:     Effort: Pulmonary effort is normal.  Abdominal:     General: Bowel sounds are normal.  Skin:    General: Skin is warm.  Neurological:     General: No focal deficit present.  Psychiatric:        Mood and Affect: Mood normal.        Thought Content: Thought content normal.        Judgment: Judgment normal.      Disposition at Discharge: Discharge disposition: 01-Home or Self Care       Discharge Orders: Discharge Instructions     Discharge patient   Complete by: As directed    Discharge disposition: 01-Home or Self Care   Discharge patient date: 07/29/2022       Condition at Discharge:   Stable  Time spent on Discharge:  Greater than 30 minutes.  Signed: [name]  APRN, MSN, FNP-C Patient Care [facility] [address] [place], [place] [address] [phone]  07/29/2022, 3:39 PM

## 2022-07-29 NOTE — Telephone Encounter (Signed)
Patient called in. Complains of pain in back, arms, and legs bilateral rates 9/10. Denied chest pain, abd pain, fever, N/V/D, or priapism. Wants to come in for treatment. Last took Percocet at 6am, pt states he has not had much relief. Pt denies recent visits to the ED. Pt denies covid exposure or flu like symptoms. Pt states his girlfriend is his transportation to and from center today. [place] Hollis FNP notified, pt approved to be seen in the day hospital today. Pt made aware, verbalized understanding.

## 2022-08-01 ENCOUNTER — Other Ambulatory Visit: Payer: Self-pay | Admitting: Family Medicine

## 2022-08-01 NOTE — Telephone Encounter (Signed)
Caller & Relationship to patient:  MRN #  [id number]   Call Back Number:   Date of Last Office Visit: 07/18/2022     Date of Next Office Visit: 08/23/2022    Medication(s) to be Refilled: Percocet  Preferred Pharmacy:   ** Please notify patient to allow 48-72 hours to process** **Let patient know to contact pharmacy at the end of the day to make sure medication is ready. ** **If patient has not been seen in a year or longer, book an appointment **Advise to use MyChart for refill requests OR to contact their pharmacy

## 2022-08-01 NOTE — H&P (Signed)
[facility] History and Physical   Date: 08/01/2022  Patient name: Timothy Marks Medical record number: [id number] Date of birth: 18-Oct-1990 Age: 32 y.o. Gender: male PCP: Quentin Angst, MD  Attending physician: No att. providers found  Chief Complaint: Sickle cell pain   History of Present Illness: Timothy Marks is a 32 year old male with a medical history significant for sickle cell disease, chronic pain syndrome, opiate dependence and tolerance, and anemia of chronic disease that presents with complaints of low back pain.  Low back pain is consistent with his previous sickle cell pain crisis.  He states the pain intensity has been elevated over the past 3 days and has been unrelieved by his home medications.  Patient claims that he has been unable to take his MS Contin due to increased itching, despite being on this medication for greater than 2 years.  He currently rates pain as 10/10, constant, and aching.  He denies headache, fever, chills, chest pain, or shortness of breath.  No urinary symptoms, nausea, vomiting, or diarrhea.  Meds: No medications prior to admission.    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   Sickle cell anemia (HCC)    Vitamin D deficiency y-6   Past Surgical History:  Procedure Laterality Date   CHOLECYSTECTOMY     Family History  Problem Relation Age of Onset   Sickle cell trait Mother    Sickle cell trait Father    Social History   Socioeconomic History   Marital status: Single    Spouse name: Not on file   Number of children: Not on file   Years of education: Not on file   Highest education level: Not on file   Occupational History   Not on file  Tobacco Use   Smoking status: Former    Types: Cigarettes   Smokeless tobacco: Never  Vaping Use   Vaping Use: Never used  Substance and Sexual Activity   Alcohol use: No   Drug use: Yes    Frequency: 7.0 times per week    Types: Marijuana   Sexual activity: Yes  Other Topics Concern   Not on file  Social History Narrative   Not on file   Social Determinants of Health   Financial Resource Strain: Low Risk  (04/21/2022)   Overall Financial Resource Strain (CARDIA)    Difficulty of Paying Living Expenses: Not hard at all  Food Insecurity: No Food Insecurity (04/21/2022)   Hunger Vital Sign    Worried About Running Out of Food in the Last Year: Never true    Ran Out of Food in the Last Year: Never true  Transportation Needs: No Transportation Needs (04/21/2022)   PRAPARE - Administrator, Civil Service (Medical): No    Lack of Transportation (Non-Medical): No  Physical Activity: Sufficiently Active (04/21/2022)   Exercise Vital Sign    Days of Exercise per Week: 5 days    Minutes of Exercise per Session: 60 min  Stress: No Stress Concern Present (04/21/2022)   [employer] of Occupational Health - Occupational Stress Questionnaire    Feeling of Stress : Not at all  Social Connections: Socially Isolated (04/21/2022)   Social Connection and Isolation Panel [NHANES]  Frequency of Communication with Friends and Family: More than three times a week    Frequency of Social Gatherings with Friends and Family: More than three times a week    Attends Religious Services: Never    Database administrator or Organizations: No    Attends Banker Meetings: Never    Marital Status: Never married  Intimate Partner Violence: Not At Risk (04/21/2022)   Humiliation, Afraid, Rape, and Kick questionnaire    Fear of Current or Ex-Partner: No    Emotionally Abused: No    Physically Abused: No    Sexually Abused: No   Review of Systems   Constitutional: Negative.   HENT: Negative.    Eyes: Negative.   Respiratory: Negative.    Cardiovascular: Negative.   Gastrointestinal: Negative.   Genitourinary: Negative.   Musculoskeletal:  Positive for back pain and joint pain.  Skin: Negative.   Neurological: Negative.   Psychiatric/Behavioral: Negative.      Physical Exam: Blood pressure 110/60, pulse 65, temperature 97.7 F (36.5 C), temperature source Temporal, resp. rate 12, SpO2 97 %. Physical Exam Constitutional:      Appearance: Normal appearance.  Eyes:     Pupils: Pupils are equal, round, and reactive to light.  Cardiovascular:     Rate and Rhythm: Normal rate and regular rhythm.     Pulses: Normal pulses.  Pulmonary:     Effort: Pulmonary effort is normal.  Abdominal:     General: Bowel sounds are normal.  Musculoskeletal:        General: Normal range of motion.  Skin:    General: Skin is warm.  Neurological:     General: No focal deficit present.     Mental Status: He is alert. Mental status is at baseline.  Psychiatric:        Mood and Affect: Mood normal.        Behavior: Behavior normal.        Thought Content: Thought content normal.        Judgment: Judgment normal.      Lab results: No results found for this or any previous visit (from the past 24 hour(s)).  Imaging results:  No results found.   Assessment & Plan: Patient admitted to sickle cell day infusion center for management of pain crisis.  Patient is opiate tolerant Initiate IV dilaudid PCA.  IV fluids, 0.45% saline at 100 mL/h Toradol 15 mg IV times one dose Tylenol 1000 mg by mouth times one dose Review CBC with differential, complete metabolic panel, and reticulocytes as results become available. Baseline hemoglobin is Pain intensity will be reevaluated in context of functioning and relationship to baseline as care progresses If pain intensity remains elevated and/or sudden change in hemodynamic stability transition to  inpatient services for higher level of care.      [name]  APRN, MSN, FNP-C Patient Care [facility] [address] [place], [place] [address] [phone]  08/01/2022, 4:49 PM

## 2022-08-03 ENCOUNTER — Other Ambulatory Visit: Payer: Self-pay | Admitting: Family Medicine

## 2022-08-04 ENCOUNTER — Other Ambulatory Visit: Payer: Self-pay | Admitting: Family Medicine

## 2022-08-11 ENCOUNTER — Other Ambulatory Visit: Payer: Self-pay | Admitting: Family Medicine

## 2022-08-11 DIAGNOSIS — G894 Chronic pain syndrome: Secondary | ICD-10-CM

## 2022-08-11 DIAGNOSIS — D571 Sickle-cell disease without crisis: Secondary | ICD-10-CM

## 2022-08-11 MED ORDER — MORPHINE SULFATE ER 15 MG PO TBCR
15.0000 mg | EXTENDED_RELEASE_TABLET | Freq: Two times a day (BID) | ORAL | 0 refills | Status: DC
Start: 2022-08-16 — End: 2022-09-14

## 2022-08-11 MED ORDER — DIPHENHYDRAMINE HCL 25 MG PO CAPS: 25.0000 mg | ORAL_CAPSULE | Freq: Four times a day (QID) | ORAL | 1 refills | Status: DC | PRN

## 2022-08-11 NOTE — Progress Notes (Signed)
Meds ordered this encounter  Medications   morphine (MS CONTIN) 15 MG 12 hr tablet    Sig: Take 1 tablet (15 mg total) by mouth every 12 (twelve) hours.    Dispense:  60 tablet    Refill:  0    Order Specific Question:   Supervising Provider    Answer:   Quentin Angst [[id number]]   diphenhydrAMINE (BENADRYL) 25 mg capsule    Sig: Take 1 capsule (25 mg total) by mouth every 6 (six) hours as needed for itching.    Dispense:  30 capsule    Refill:  1    Order Specific Question:   Supervising Provider    Answer:   Quentin Angst [[id number]]   [name]  APRN, MSN, FNP-C Patient Care [facility] [address] [place], [place] [address] [phone]

## 2022-08-11 NOTE — Telephone Encounter (Signed)
Caller & Relationship to patient:  MRN #  [id number]   Call Back Number:   Date of Last Office Visit: 07/18/2022     Date of Next Office Visit: 08/23/2022    Medication(s) to be Refilled: Morphine  Preferred Pharmacy:   ** Please notify patient to allow 48-72 hours to process** **Let patient know to contact pharmacy at the end of the day to make sure medication is ready. ** **If patient has not been seen in a year or longer, book an appointment **Advise to use MyChart for refill requests OR to contact their pharmacy

## 2022-08-15 ENCOUNTER — Other Ambulatory Visit: Payer: Self-pay | Admitting: Family Medicine

## 2022-08-15 ENCOUNTER — Other Ambulatory Visit: Payer: Self-pay

## 2022-08-15 DIAGNOSIS — G894 Chronic pain syndrome: Secondary | ICD-10-CM

## 2022-08-15 MED ORDER — OXYCODONE-ACETAMINOPHEN 10-325 MG PO TABS
1.0000 | ORAL_TABLET | Freq: Four times a day (QID) | ORAL | 0 refills | Status: DC | PRN
Start: 2022-08-21 — End: 2022-09-05

## 2022-08-15 NOTE — Telephone Encounter (Signed)
From: Gertie Baron To: Office of Julianne Handler, [place] Sent: 08/13/2022 10:15 AM EDT Subject: Medication Renewal Request  Refills have been requested for the following medications:   oxyCODONE-acetaminophen (PERCOCET) 10-325 MG tablet [Lachina Hollis]  Patient Comment: Need a refill its due this week   Preferred pharmacy: [facility] [id number] - [place], [place] - [address] Delivery method: [employer]

## 2022-08-15 NOTE — Telephone Encounter (Signed)
Please advise KH 

## 2022-08-15 NOTE — Progress Notes (Signed)
Reviewed PDMP substance reporting system prior to prescribing opiate medications. No inconsistencies noted.   Meds ordered this encounter  Medications  . oxyCODONE-acetaminophen (PERCOCET) 10-325 MG tablet    Sig: Take 1 tablet by mouth every 6 (six) hours as needed for pain.    Dispense:  60 tablet    Refill:  0    Order Specific Question:   Supervising Provider    Answer:   JEGEDE, OLUGBEMIGA E [[id number]]     Kaan Tosh Moore Keshanna Riso  APRN, MSN, FNP-C Patient Care [facility] [address]  [place], [place] [address] [phone]  

## 2022-08-23 ENCOUNTER — Ambulatory Visit (INDEPENDENT_AMBULATORY_CARE_PROVIDER_SITE_OTHER): Payer: 59 | Admitting: Family Medicine

## 2022-08-23 VITALS — BP 120/73 | HR 85 | Temp 98.2°F | Ht 74.0 in | Wt 116.4 lb

## 2022-08-23 DIAGNOSIS — E559 Vitamin D deficiency, unspecified: Secondary | ICD-10-CM

## 2022-08-23 DIAGNOSIS — Z86718 Personal history of other venous thrombosis and embolism: Secondary | ICD-10-CM

## 2022-08-23 DIAGNOSIS — D571 Sickle-cell disease without crisis: Secondary | ICD-10-CM

## 2022-08-23 DIAGNOSIS — G894 Chronic pain syndrome: Secondary | ICD-10-CM

## 2022-08-23 MED ORDER — RIVAROXABAN 20 MG PO TABS: 20.0000 mg | ORAL_TABLET | Freq: Every day | ORAL | 5 refills | Status: DC

## 2022-08-23 NOTE — Progress Notes (Signed)
Established Patient Office Visit  Subjective   Patient ID: Timothy Marks, male    DOB: 10-29-1990  Age: 32 y.o. MRN: [id number]  Chief Complaint  Patient presents with   Sickle Cell Anemia    Timothy Marks is a 32 year old male with a medical history significant for sickle cell disease, chronic pain syndrome, opiate dependence and tolerance, and history of anemia of chronic disease that presents for medication management and a follow-up of his sickle cell disease.  Today, patient states that he is doing well.  His chronic pain has mostly been to back, and bilateral hips.  Patient currently rates his pain as 5/10.  He last had Percocet and MS Contin on last night with some relief.  He denies any fever, chills, chest pain, or shortness of breath. Patient is currently not up-to-date with his eye exam.  He has been referred to ophthalmology in the past, but has not establish care.    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   Arm pain, diffuse, right 07/02/2021   Marijuana smoker 01/27/2021   Lab test positive for detection of COVID-19 virus 01/23/2020   Medication management 01/16/2020   Underweight 06/14/2019   Generalized weakness 03/31/2019   Sickle-cell crisis (HCC) 03/24/2019   SIRS (systemic inflammatory response syndrome) (HCC) 02/10/2019   Abnormal liver function 11/04/2018   Sickle cell anemia with crisis (HCC) 05/22/2018   Narcotic dependence (HCC) 02/10/2018   Acute bilateral low back pain without sciatica    Other chronic pain    Anemia    Socially inappropriate behavior 12/02/2017   Thrombocythemia    Physical deconditioning    Agitation    Chronic pain syndrome    Recurrent cold sores    Fever of unknown origin    Drug-seeking behavior 08/30/2017   Pain    Sickle cell crisis (HCC) 07/16/2017   Adjustment disorder with mixed disturbance of emotions and conduct    Bilateral pulmonary  infiltrates on chest x-ray 11/21/2016   History of DVT (deep vein thrombosis) 11/17/2016   Suicidal ideations 10/11/2016   Reticulocytosis 10/09/2016   DVT (deep venous thrombosis) (HCC) 08/02/2016   Has difficulty accessing primary care provider for most visits 07/11/2015   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   Hordeolum externum of left upper eyelid 05/31/2015   Major depressive disorder with current active episode 11/09/2014   Hb-SS disease with acute chest syndrome (HCC) 11/08/2014   Pneumonia of right upper lobe due to infectious organism 11/07/2014   Constipation 08/03/2014   Leukocytosis 06/30/2014   h/o Priapism 05/06/2014   Protein-calorie malnutrition, severe (HCC) 04/09/2014   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, left shoulder (HCC)    Vitamin D deficiency    Sickle cell anemia (HCC) 03/30/2014   Hyperbilirubinemia 03/30/2014   Hypokalemia 02/07/2014   Mandible fracture (HCC) 05/03/2013   Aggressive behavior 01/25/2013   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   Sickle cell anemia with pain (HCC) 01/24/2013   Sickle cell pain crisis (HCC) 01/24/2013   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   Other hemoglobinopathies (HCC) 04/22/2011   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   Sickle cell anemia (HCC)    Vitamin D deficiency y-6   Past Surgical  History:  Procedure Laterality Date   CHOLECYSTECTOMY     Social History   Tobacco Use   Smoking status: Former    Types: Cigarettes   Smokeless tobacco: Never  Vaping Use   Vaping status: Never Used  Substance Use Topics   Alcohol use: No   Drug use: Yes    Frequency: 7.0 times per week    Types: Marijuana   Social History   Socioeconomic History   Marital  status: Single    Spouse name: Not on file   Number of children: Not on file   Years of education: Not on file   Highest education level: 12th grade  Occupational History   Not on file  Tobacco Use   Smoking status: Former    Types: Cigarettes   Smokeless tobacco: Never  Vaping Use   Vaping status: Never Used  Substance and Sexual Activity   Alcohol use: No   Drug use: Yes    Frequency: 7.0 times per week    Types: Marijuana   Sexual activity: Yes  Other Topics Concern   Not on file  Social History Narrative   Not on file   Social Determinants of Health   Financial Resource Strain: Low Risk  (08/23/2022)   Overall Financial Resource Strain (CARDIA)    Difficulty of Paying Living Expenses: Not hard at all  Food Insecurity: Food Insecurity Present (09/07/2022)   Hunger Vital Sign    Worried About Running Out of Food in the Last Year: Never true    Ran Out of Food in the Last Year: Sometimes true  Transportation Needs: No Transportation Needs (09/07/2022)   PRAPARE - Administrator, Civil Service (Medical): No    Lack of Transportation (Non-Medical): No  Physical Activity: Sufficiently Active (08/23/2022)   Exercise Vital Sign    Days of Exercise per Week: 4 days    Minutes of Exercise per Session: 60 min  Stress: No Stress Concern Present (08/28/2022)   Received from [facility] of Occupational Health - Occupational Stress Questionnaire    Feeling of Stress : Not at all  Social Connections: Unknown (08/23/2022)   Social Connection and Isolation Panel [NHANES]    Frequency of Communication with Friends and Family: Once a week    Frequency of Social Gatherings with Friends and Family: Once a week    Attends Religious Services: Never    Database administrator or Organizations: No    Attends Banker Meetings: Never    Marital Status: Patient declined  Catering manager Violence: Not At Risk (09/07/2022)   Humiliation, Afraid, Rape,  and Kick questionnaire    Fear of Current or Ex-Partner: No    Emotionally Abused: No    Physically Abused: No    Sexually Abused: No   Family Status  Relation Name Status   Mother  Alive   Father  Alive  No partnership data on file   Family History  Problem Relation Age of Onset   Sickle cell trait Mother    Sickle cell trait Father    Allergies  Allergen Reactions   Buprenorphine Hcl Hives   Levaquin [Levofloxacin] Itching   Meperidine Rash   Morphine Hives   Morphine And Codeine Hives   Tramadol Hives   Vancomycin Itching   Zosyn [Piperacillin Sod-Tazobactam So] Itching, Rash and Other (See Comments)    Has taken rocephin in the past      Review of Systems  Constitutional: Negative.   HENT: Negative.    Respiratory: Negative.    Cardiovascular: Negative.   Genitourinary: Negative.   Musculoskeletal:  Positive for back pain and joint pain.  Skin: Negative.   Psychiatric/Behavioral: Negative.        Objective:     BP 120/73   Pulse 85   Temp 98.2 F (36.8 C)   Ht 6\' 2"  (1.88 m)   Wt 116 lb 6.4 oz (52.8 kg)   SpO2 100%   BMI 14.94 kg/m  BP Readings from Last 3 Encounters:  09/10/22 116/74  08/23/22 120/73  07/29/22 110/60   Wt Readings from Last 3 Encounters:  09/07/22 125 lb (56.7 kg)  08/23/22 116 lb 6.4 oz (52.8 kg)  06/28/22 123 lb 14.4 oz (56.2 kg)      Physical Exam Constitutional:      Appearance: Normal appearance.  Cardiovascular:     Rate and Rhythm: Normal rate and regular rhythm.     Pulses: Normal pulses.     Heart sounds: Normal heart sounds.  Pulmonary:     Effort: Pulmonary effort is normal.  Abdominal:     General: Abdomen is flat. Bowel sounds are normal.  Skin:    General: Skin is warm.  Neurological:     General: No focal deficit present.     Mental Status: He is alert. Mental status is at baseline.  Psychiatric:        Mood and Affect: Mood normal.        Behavior: Behavior normal.        Thought Content:  Thought content normal.        Judgment: Judgment normal.      Results for orders placed or performed in visit on 08/23/22  [id number] 11+Oxyco+Alc+Crt-Bund  Result Value Ref Range   Ethanol Negative Cutoff=0.020 %   Amphetamines, Urine Negative Cutoff=1000 ng/mL   Barbiturate Negative Cutoff=200 ng/mL   BENZODIAZ UR QL Negative Cutoff=200 ng/mL   Cannabinoid Quant, Ur See Final Results Cutoff=50 ng/mL   Cocaine (Metabolite) Negative Cutoff=300 ng/mL   OPIATE SCREEN URINE See Final Results Cutoff=300 ng/mL   Oxycodone/Oxymorphone, Urine See Final Results Cutoff=300 ng/mL   Phencyclidine Negative Cutoff=25 ng/mL   Methadone Screen, Urine Negative Cutoff=300 ng/mL   Propoxyphene Negative Cutoff=300 ng/mL   Meperidine Negative Cutoff=200 ng/mL   Tramadol Negative Cutoff=200 ng/mL   Creatinine 76.9 20.0 - 300.0 mg/dL   pH, Urine 8.0 4.5 - 8.9  Sickle Cell Panel  Result Value Ref Range   Glucose CANCELED mg/dL   BUN 8 6 - 20 mg/dL   Creatinine, Ser CANCELED mg/dL   BUN/Creatinine Ratio CANCELED    Sodium 140 134 - 144 mmol/L   Potassium CANCELED mmol/L   Chloride 103 96 - 106 mmol/L   CO2 CANCELED mmol/L   Calcium CANCELED mg/dL   Total Protein 8.7 (H) 6.0 - 8.5 g/dL   Albumin 4.8 4.1 - 5.1 g/dL   Globulin, Total 3.9 1.5 - 4.5 g/dL   Bilirubin Total CANCELED mg/dL   Alkaline Phosphatase CANCELED IU/L   AST 52 (H) 0 - 40 IU/L   ALT CANCELED IU/L   Ferritin CANCELED ng/mL   Vit D, 25-Hydroxy CANCELED ng/mL   WBC 8.5 3.4 - 10.8 x10E3/uL   RBC 3.75 (L) 4.14 - 5.80 x10E6/uL   Hemoglobin 10.8 (L) 13.0 - 17.7 g/dL   Hematocrit 04.5 (L) 40.9 - 51.0 %   MCV 91 79 - 97 fL   MCH 28.8 26.6 - 33.0  pg   MCHC 31.8 31.5 - 35.7 g/dL   RDW 16.1 (H) 09.6 - 04.5 %   Platelets 528 (H) 150 - 450 x10E3/uL   Neutrophils 47 Not Estab. %   Lymphs 41 Not Estab. %   Monocytes 8 Not Estab. %   Eos 2 Not Estab. %   Basos 1 Not Estab. %   Neutrophils Absolute 4.1 1.4 - 7.0 x10E3/uL   Lymphocytes  Absolute 3.5 (H) 0.7 - 3.1 x10E3/uL   Monocytes Absolute 0.7 0.1 - 0.9 x10E3/uL   EOS (ABSOLUTE) 0.2 0.0 - 0.4 x10E3/uL   Basophils Absolute 0.1 0.0 - 0.2 x10E3/uL   Immature Granulocytes 1 Not Estab. %   Immature Grans (Abs) 0.1 0.0 - 0.1 x10E3/uL   NRBC 4 (H) 0 - 0 %   Retic Ct Pct 11.3 (H) 0.6 - 2.6 %  Cannabinoid Conf, Ur  Result Value Ref Range   CANNABINOIDS Positive (A) Cutoff=50   Carboxy THC GC/MS Conf 350 Cutoff=15 ng/mL  Opiates Confirmation, Urine  Result Value Ref Range   Opiates Positive (A) Cutoff=300 ng/mL     Codeine Negative Cutoff=300     Morphine Positive (A)       Morphine Confirm >3000 Cutoff=300 ng/mL     Hydromorphone Negative Cutoff=300     Hydrocodone Negative Cutoff=300  Oxycodone/Oxymorphone, Confirm  Result Value Ref Range   OXYCODONE/OXYMORPH Positive (A) Cutoff=300   OXYCODONE Negative Cutoff=300   OXYMORPHONE Positive (A)    OXYMORPHONE (GC/MS) 789 Cutoff=300 ng/mL    Last CBC Lab Results  Component Value Date   WBC 13.2 (H) 09/10/2022   HGB 7.5 (L) 09/10/2022   HCT 22.2 (L) 09/10/2022   MCV 87.4 09/10/2022   MCH 29.5 09/10/2022   RDW 21.2 (H) 09/10/2022   PLT 658 (H) 09/10/2022   Last metabolic panel Lab Results  Component Value Date   GLUCOSE 132 (H) 09/10/2022   NA 140 09/10/2022   K 5.1 09/10/2022   CL 106 09/10/2022   CO2 23 09/10/2022   BUN 7 09/10/2022   CREATININE 0.61 09/10/2022   GFRNONAA >60 09/10/2022   CALCIUM 8.7 (L) 09/10/2022   PHOS 3.5 01/23/2020   PROT 6.6 09/09/2022   ALBUMIN 3.1 (L) 09/09/2022   LABGLOB 3.9 08/26/2022   AGRATIO 1.0 (L) 05/17/2022   BILITOT 1.2 09/09/2022   ALKPHOS 65 09/09/2022   AST 21 09/09/2022   ALT 21 09/09/2022   ANIONGAP 11 09/10/2022   Last lipids No results found for: "CHOL", "HDL", "LDLCALC", "LDLDIRECT", "TRIG", "CHOLHDL" Last hemoglobin A1c No results found for: "HGBA1C" Last thyroid functions Lab Results  Component Value Date   TSH 0.884 08/31/2018   T4TOTAL 8.1  08/31/2018   Last vitamin D Lab Results  Component Value Date   VD25OH CANCELED 08/26/2022   Last vitamin B12 and Folate No results found for: "VITAMINB12", "FOLATE"    The ASCVD Risk score (Arnett DK, et al., 2019) failed to calculate for the following reasons:   The 2019 ASCVD risk score is only valid for ages 60 to 21    Assessment & Plan:   Problem List Items Addressed This Visit       Other   Vitamin D deficiency (Chronic)   Relevant Orders   Sickle Cell Panel (Completed)   Sickle cell anemia (HCC) - Primary (Chronic)   Relevant Orders   [id number] 11+Oxyco+Alc+Crt-Bund (Completed)   Sickle Cell Panel (Completed)   History of DVT (deep vein thrombosis)   Relevant Medications   rivaroxaban (  XARELTO) 20 MG TABS tablet   Chronic pain syndrome   Relevant Orders   [id number] 11+Oxyco+Alc+Crt-Bund (Completed)   Cannabinoid Conf, Ur (Completed)   Opiates Confirmation, Urine (Completed)   Oxycodone/Oxymorphone, Confirm (Completed)  Discussed pain medication regimen at length.  Patient is requesting an increase in the amount of short acting opiates received every 2 weeks.  Request declined.  Will continue patient's current medication regimen.  Pain management referral offered. Will resend ophthalmology referral. No medication changes today, will resume medication regimen.  Patient will continue to follow-up every 3 months for medication management. [name]  APRN, MSN, FNP-C Patient Care [facility] [address] [place], [place] [address] [phone]

## 2022-08-26 ENCOUNTER — Other Ambulatory Visit: Payer: 59

## 2022-08-27 LAB — DRUG SCREEN 764883 11+OXYCO+ALC+CRT-BUND
Amphetamines, Urine: NEGATIVE ng/mL
BENZODIAZ UR QL: NEGATIVE ng/mL
Barbiturate: NEGATIVE ng/mL
Cocaine (Metabolite): NEGATIVE ng/mL
Creatinine: 76.9 mg/dL (ref 20.0–300.0)
Ethanol: NEGATIVE %
Meperidine: NEGATIVE ng/mL
Methadone Screen, Urine: NEGATIVE ng/mL
Phencyclidine: NEGATIVE ng/mL
Propoxyphene: NEGATIVE ng/mL
Tramadol: NEGATIVE ng/mL
pH, Urine: 8 (ref 4.5–8.9)

## 2022-08-27 LAB — OPIATES CONFIRMATION, URINE
Codeine: NEGATIVE
Hydrocodone: NEGATIVE
Hydromorphone: NEGATIVE
Morphine Confirm: >3000 ng/mL
Morphine: POSITIVE — AB
Opiates: POSITIVE ng/mL — AB

## 2022-08-27 LAB — OXYCODONE/OXYMORPHONE, CONFIRM
OXYCODONE/OXYMORPH: POSITIVE — AB
OXYCODONE: NEGATIVE
OXYMORPHONE (GC/MS): 789 ng/mL
OXYMORPHONE: POSITIVE — AB

## 2022-08-27 LAB — CANNABINOID CONFIRMATION, UR
CANNABINOIDS: POSITIVE — AB
Carboxy THC GC/MS Conf: 350 ng/mL

## 2022-08-30 ENCOUNTER — Other Ambulatory Visit: Payer: Self-pay

## 2022-08-30 ENCOUNTER — Telehealth: Payer: Self-pay | Admitting: Internal Medicine

## 2022-08-30 DIAGNOSIS — G894 Chronic pain syndrome: Secondary | ICD-10-CM

## 2022-08-30 NOTE — Telephone Encounter (Signed)
 Please advise KH 

## 2022-08-30 NOTE — Telephone Encounter (Signed)
Just to have this recorded The pt will have to have a hip replacement due blood clot in his legs  He left this on a v/m and I called and confirmed this with him but wanted to let a provider know what's going on.

## 2022-08-31 LAB — CMP14+CBC/D/PLT+FER+RETIC+V...
AST: 52 [IU]/L — ABNORMAL HIGH (ref 0–40)
Albumin: 4.8 g/dL (ref 4.1–5.1)
BUN: 8 mg/dL (ref 6–20)
Basophils Absolute: 0.1 10*3/uL (ref 0.0–0.2)
Basos: 1 %
Chloride: 103 mmol/L (ref 96–106)
EOS (ABSOLUTE): 0.2 10*3/uL (ref 0.0–0.4)
Eos: 2 %
Globulin, Total: 3.9 g/dL (ref 1.5–4.5)
Hematocrit: 34 % — ABNORMAL LOW (ref 37.5–51.0)
Hemoglobin: 10.8 g/dL — ABNORMAL LOW (ref 13.0–17.7)
Immature Grans (Abs): 0.1 10*3/uL (ref 0.0–0.1)
Immature Granulocytes: 1 %
Lymphocytes Absolute: 3.5 10*3/uL — ABNORMAL HIGH (ref 0.7–3.1)
Lymphs: 41 %
MCH: 28.8 pg (ref 26.6–33.0)
MCHC: 31.8 g/dL (ref 31.5–35.7)
MCV: 91 fL (ref 79–97)
Monocytes Absolute: 0.7 10*3/uL (ref 0.1–0.9)
Monocytes: 8 %
NRBC: 4 % — ABNORMAL HIGH (ref 0–0)
Neutrophils Absolute: 4.1 10*3/uL (ref 1.4–7.0)
Neutrophils: 47 %
Platelets: 528 10*3/uL — ABNORMAL HIGH (ref 150–450)
RBC: 3.75 x10E6/uL — ABNORMAL LOW (ref 4.14–5.80)
RDW: 21.3 % — ABNORMAL HIGH (ref 11.6–15.4)
Retic Ct Pct: 11.3 % — ABNORMAL HIGH (ref 0.6–2.6)
Sodium: 140 mmol/L (ref 134–144)
Total Protein: 8.7 g/dL — ABNORMAL HIGH (ref 6.0–8.5)
WBC: 8.5 10*3/uL (ref 3.4–10.8)

## 2022-09-02 ENCOUNTER — Telehealth: Payer: Self-pay

## 2022-09-02 NOTE — Telephone Encounter (Signed)
Dr Emily Filbert called to advise pt is being treated at their facilty

## 2022-09-05 ENCOUNTER — Other Ambulatory Visit: Payer: Self-pay | Admitting: Family Medicine

## 2022-09-05 ENCOUNTER — Telehealth (HOSPITAL_COMMUNITY): Payer: Self-pay | Admitting: *Deleted

## 2022-09-05 ENCOUNTER — Telehealth: Payer: Self-pay

## 2022-09-05 DIAGNOSIS — G894 Chronic pain syndrome: Secondary | ICD-10-CM

## 2022-09-05 MED ORDER — OXYCODONE-ACETAMINOPHEN 10-325 MG PO TABS
1.0000 | ORAL_TABLET | Freq: Four times a day (QID) | ORAL | 0 refills | Status: DC | PRN
Start: 2022-09-09 — End: 2022-09-22

## 2022-09-05 NOTE — Transitions of Care (Post Inpatient/ED Visit) (Signed)
09/05/2022  Name: Timothy Marks MRN: [id number] DOB: 1990-04-02  Today's TOC FU Call Status: Today's TOC FU Call Status:: Successful TOC FU Call Competed TOC FU Call Complete Date: 09/05/22  Transition Care Management Follow-up Telephone Call Date of Discharge: 09/04/22 Discharge Facility: Other [facility]) Name of Other (Non[facility]) Discharge Facility: [facility] Type of Discharge: Inpatient Admission Primary Inpatient Discharge Diagnosis:: Sickle cell pain crisis How have you been since you were released from the hospital?: Same Any questions or concerns?: No  Items Reviewed: Did you receive and understand the discharge instructions provided?: Yes Medications obtained,verified, and reconciled?: Yes (Medications Reviewed) Any new allergies since your discharge?: No Dietary orders reviewed?: Yes Do you have support at home?: Yes  Medications Reviewed Today: Medications Reviewed Today     Reviewed by Raj Janus, CMA (Certified Medical Assistant) on 08/23/22 at 1014  Med List Status: <None>   Medication Order Taking? Sig Documenting Provider Last Dose Status Informant  cholecalciferol (VITAMIN D) 25 MCG (1000 UNIT) tablet [id number] Yes Take 1,000 Units by mouth daily. [provider] Taking Active Self  diphenhydrAMINE (BENADRYL) 25 mg capsule [id number] Yes Take 1 capsule (25 mg total) by mouth every 6 (six) hours as needed for itching. Massie Maroon, FNP Taking Active   folic acid (FOLVITE) 1 MG tablet [id number] Yes Take 1 tablet (1 mg total) by mouth daily. Massie Maroon, FNP Taking Active   gabapentin (NEURONTIN) 300 MG capsule [id number] Yes TAKE 1 CAPSULE(300 MG) BY MOUTH THREE TIMES DAILY Quentin Angst, MD Taking Active   hydrocortisone 1 % ointment [id number] Yes Apply 1 Application topically 2 (two) times daily. Quentin Angst, MD Taking Active   hydroxyurea (HYDREA) 500 MG capsule [id number] Yes  Take 1 capsule (500 mg total) by mouth 2 (two) times daily. May take with food to minimize GI side effects. Massie Maroon, FNP Taking Active   hydrOXYzine (ATARAX) 25 MG tablet [id number] Yes Take 25 mg by mouth daily as needed for anxiety or vomiting. [provider] Taking Active Self  ibuprofen (ADVIL) 800 MG tablet [id number] Yes Take 800 mg by mouth every 8 (eight) hours as needed. [provider] Taking Active   mirtazapine (REMERON) 7.5 MG tablet [id number] Yes Take 7.5 mg by mouth at bedtime as needed (for sleep). [provider] Taking Active Self  morphine (MS CONTIN) 15 MG 12 hr tablet [id number] Yes Take 1 tablet (15 mg total) by mouth every 12 (twelve) hours. Massie Maroon, FNP Taking Active   OLANZapine (ZYPREXA) 5 MG tablet [id number] Yes Take 1 tablet (5 mg total) by mouth at bedtime. Massie Maroon, FNP Taking Active Self  oxyCODONE-acetaminophen (PERCOCET) 10-325 MG tablet [id number] Yes Take 1 tablet by mouth every 6 (six) hours as needed for pain. Massie Maroon, FNP Taking Active   rivaroxaban (XARELTO) 20 MG TABS tablet [id number] Yes Take 1 tablet (20 mg total) by mouth daily with supper. Massie Maroon, FNP Taking Active             Home Care and Equipment/Supplies: Were Home Health Services Ordered?: No Any new equipment or medical supplies ordered?: No  Functional Questionnaire: Do you need assistance with bathing/showering or dressing?: No Do you need assistance with meal preparation?: No Do you need assistance with eating?: No Do you have difficulty maintaining continence: No Do you need assistance with getting out of bed/getting out of a chair/moving?: No Do you have difficulty managing or taking your  medications?: No  Follow up appointments reviewed: PCP Follow-up appointment confirmed?: No (pt needs to be seen ASAP- still in alot of pain- requesting refills of pain meds that he called in to the clinic this morning- pt  states he has court tomorrow 09-06-22 - will ask front desk to call pt and schedule fu appt) MD Provider Line Number:[phone] Given: Yes Follow-up Provider: Julianne Handler FNP Specialist [facility] Follow-up appointment confirmed?: No Do you need transportation to your follow-up appointment?: No Do you understand care options if your condition(s) worsen?: Yes-patient verbalized understanding    SIGNATURE  Woodfin Ganja LPN [facility] Nurse Health Advisor Direct Dial [phone]

## 2022-09-05 NOTE — Telephone Encounter (Signed)
Patient called requesting to come to the day hospital for sickle cell pain. Patient reports back pain rated 10/10. Reports taking Percocet and MS Contin at 1:00 am. Patient is now out of Percocet but can pick it up from his pharmacy today. Patient recently admitted to [facility] and was discharged on yesterday. Denies fever, chest pain, nausea, vomiting, diarrhea, abdominal pain and priapism. [place], FNP notified and advised that patient pick up his pain medication from pharmacy and continue to manage pain at home since he was recently discharged from the hospital.  Patient also advised to make a post hospital follow-up appointment with his PCP. Patient advised and expresses an understanding.

## 2022-09-07 ENCOUNTER — Other Ambulatory Visit: Payer: Self-pay

## 2022-09-07 ENCOUNTER — Inpatient Hospital Stay (HOSPITAL_BASED_OUTPATIENT_CLINIC_OR_DEPARTMENT_OTHER)
Admission: EM | Admit: 2022-09-07 | Discharge: 2022-09-10 | DRG: 811 | Discharge status: Against Medical Advice | Payer: 59 | Attending: Internal Medicine | Admitting: Internal Medicine

## 2022-09-07 ENCOUNTER — Encounter (HOSPITAL_BASED_OUTPATIENT_CLINIC_OR_DEPARTMENT_OTHER): Payer: Self-pay | Admitting: Emergency Medicine

## 2022-09-07 DIAGNOSIS — E43 Unspecified severe protein-calorie malnutrition: Secondary | ICD-10-CM | POA: Diagnosis present

## 2022-09-07 DIAGNOSIS — D638 Anemia in other chronic diseases classified elsewhere: Secondary | ICD-10-CM | POA: Diagnosis present

## 2022-09-07 DIAGNOSIS — F411 Generalized anxiety disorder: Secondary | ICD-10-CM | POA: Diagnosis present

## 2022-09-07 DIAGNOSIS — Z881 Allergy status to other antibiotic agents status: Secondary | ICD-10-CM | POA: Diagnosis not present

## 2022-09-07 DIAGNOSIS — D72829 Elevated white blood cell count, unspecified: Secondary | ICD-10-CM | POA: Diagnosis present

## 2022-09-07 DIAGNOSIS — Z79899 Other long term (current) drug therapy: Secondary | ICD-10-CM

## 2022-09-07 DIAGNOSIS — Z885 Allergy status to narcotic agent status: Secondary | ICD-10-CM | POA: Diagnosis not present

## 2022-09-07 DIAGNOSIS — D5700 Hb-SS disease with crisis, unspecified: Secondary | ICD-10-CM | POA: Diagnosis present

## 2022-09-07 DIAGNOSIS — Z9049 Acquired absence of other specified parts of digestive tract: Secondary | ICD-10-CM | POA: Diagnosis not present

## 2022-09-07 DIAGNOSIS — D75839 Thrombocytosis, unspecified: Secondary | ICD-10-CM | POA: Diagnosis present

## 2022-09-07 DIAGNOSIS — M545 Low back pain, unspecified: Secondary | ICD-10-CM | POA: Diagnosis present

## 2022-09-07 DIAGNOSIS — Z832 Family history of diseases of the blood and blood-forming organs and certain disorders involving the immune mechanism: Secondary | ICD-10-CM | POA: Diagnosis not present

## 2022-09-07 DIAGNOSIS — F319 Bipolar disorder, unspecified: Secondary | ICD-10-CM | POA: Diagnosis present

## 2022-09-07 DIAGNOSIS — Z7901 Long term (current) use of anticoagulants: Secondary | ICD-10-CM | POA: Diagnosis not present

## 2022-09-07 DIAGNOSIS — Z888 Allergy status to other drugs, medicaments and biological substances status: Secondary | ICD-10-CM

## 2022-09-07 DIAGNOSIS — R4689 Other symptoms and signs involving appearance and behavior: Secondary | ICD-10-CM | POA: Diagnosis present

## 2022-09-07 DIAGNOSIS — Z87891 Personal history of nicotine dependence: Secondary | ICD-10-CM

## 2022-09-07 DIAGNOSIS — Z86718 Personal history of other venous thrombosis and embolism: Secondary | ICD-10-CM

## 2022-09-07 DIAGNOSIS — G894 Chronic pain syndrome: Secondary | ICD-10-CM | POA: Diagnosis present

## 2022-09-07 LAB — RETICULOCYTES
Immature Retic Fract: 48.8 % — ABNORMAL HIGH (ref 2.3–15.9)
RBC.: 2.34 MIL/uL — ABNORMAL LOW (ref 4.22–5.81)
Retic Count, Absolute: 356.4 10*3/uL — ABNORMAL HIGH (ref 19.0–186.0)
Retic Ct Pct: 15.2 % — ABNORMAL HIGH (ref 0.4–3.1)

## 2022-09-07 LAB — CBC WITH DIFFERENTIAL/PLATELET
Abs Immature Granulocytes: 0.12 10*3/uL — ABNORMAL HIGH (ref 0.00–0.07)
Basophils Absolute: 0 10*3/uL (ref 0.0–0.1)
Basophils Relative: 0 %
Eosinophils Absolute: 0.1 10*3/uL (ref 0.0–0.5)
Eosinophils Relative: 1 %
HCT: 21.1 % — ABNORMAL LOW (ref 39.0–52.0)
Hemoglobin: 7.1 g/dL — ABNORMAL LOW (ref 13.0–17.0)
Immature Granulocytes: 1 %
Lymphocytes Relative: 8 %
Lymphs Abs: 1.3 10*3/uL (ref 0.7–4.0)
MCH: 30.5 pg (ref 26.0–34.0)
MCHC: 33.6 g/dL (ref 30.0–36.0)
MCV: 90.6 fL (ref 80.0–100.0)
Monocytes Absolute: 1.6 10*3/uL — ABNORMAL HIGH (ref 0.1–1.0)
Monocytes Relative: 10 %
Neutro Abs: 13 10*3/uL — ABNORMAL HIGH (ref 1.7–7.7)
Neutrophils Relative %: 80 %
Platelets: 620 10*3/uL — ABNORMAL HIGH (ref 150–400)
RBC: 2.33 MIL/uL — ABNORMAL LOW (ref 4.22–5.81)
RDW: 24.7 % — ABNORMAL HIGH (ref 11.5–15.5)
WBC: 16.2 10*3/uL — ABNORMAL HIGH (ref 4.0–10.5)
nRBC: 12.3 % — ABNORMAL HIGH (ref 0.0–0.2)

## 2022-09-07 LAB — BASIC METABOLIC PANEL
Anion gap: 8 (ref 5–15)
BUN: 9 mg/dL (ref 6–20)
CO2: 28 mmol/L (ref 22–32)
Calcium: 9.4 mg/dL (ref 8.9–10.3)
Chloride: 100 mmol/L (ref 98–111)
Creatinine, Ser: 0.55 mg/dL — ABNORMAL LOW (ref 0.61–1.24)
GFR, Estimated: >60 mL/min (ref >60)
Glucose, Bld: 104 mg/dL — ABNORMAL HIGH (ref 70–99)
Potassium: 3.8 mmol/L (ref 3.5–5.1)
Sodium: 136 mmol/L (ref 135–145)

## 2022-09-07 LAB — TYPE AND SCREEN
ABO/RH(D): A POS
Antibody Screen: NEGATIVE

## 2022-09-07 LAB — PREPARE RBC (CROSSMATCH)

## 2022-09-07 LAB — CBC
HCT: 19.7 % — ABNORMAL LOW (ref 39.0–52.0)
Hemoglobin: 6.6 g/dL — CL (ref 13.0–17.0)
MCH: 31.1 pg (ref 26.0–34.0)
MCHC: 33.5 g/dL (ref 30.0–36.0)
MCV: 92.9 fL (ref 80.0–100.0)
Platelets: 556 10*3/uL — ABNORMAL HIGH (ref 150–400)
RBC: 2.12 MIL/uL — ABNORMAL LOW (ref 4.22–5.81)
RDW: 23.7 % — ABNORMAL HIGH (ref 11.5–15.5)
WBC: 14.8 10*3/uL — ABNORMAL HIGH (ref 4.0–10.5)
nRBC: 11 % — ABNORMAL HIGH (ref 0.0–0.2)

## 2022-09-07 LAB — CREATININE, SERUM
Creatinine, Ser: 0.52 mg/dL — ABNORMAL LOW (ref 0.61–1.24)
GFR, Estimated: >60 mL/min (ref >60)

## 2022-09-07 MED ORDER — DEXTROSE-SODIUM CHLORIDE 5-0.45 % IV SOLN: INTRAVENOUS | Status: DC

## 2022-09-07 MED ORDER — RIVAROXABAN 20 MG PO TABS: 20.0000 mg | ORAL_TABLET | Freq: Every day | ORAL | Status: DC

## 2022-09-07 MED ORDER — APIXABAN 5 MG PO TABS
10.0000 mg | ORAL_TABLET | Freq: Two times a day (BID) | ORAL | Status: DC
  Administered 2022-09-07 – 2022-09-10 (×6): 10 mg via ORAL
  Filled 2022-09-07 (×6): qty 2

## 2022-09-07 MED ORDER — ONDANSETRON HCL 4 MG/2ML IJ SOLN: 4.0000 mg | INTRAMUSCULAR | Status: DC | PRN

## 2022-09-07 MED ORDER — NALOXONE HCL 0.4 MG/ML IJ SOLN: 0.4000 mg | INTRAMUSCULAR | Status: DC | PRN

## 2022-09-07 MED ORDER — POLYETHYLENE GLYCOL 3350 17 G PO PACK: 17.0000 g | PACK | Freq: Every day | ORAL | Status: DC | PRN

## 2022-09-07 MED ORDER — HYDROMORPHONE HCL 1 MG/ML IJ SOLN
1.0000 mg | INTRAMUSCULAR | Status: DC | PRN
  Administered 2022-09-07 – 2022-09-09 (×12): 1 mg via INTRAVENOUS
  Filled 2022-09-07 (×11): qty 1

## 2022-09-07 MED ORDER — ACETAMINOPHEN 325 MG PO TABS
650.0000 mg | ORAL_TABLET | Freq: Once | ORAL | Status: AC
  Administered 2022-09-08: 650 mg via ORAL
  Filled 2022-09-07: qty 2

## 2022-09-07 MED ORDER — ONDANSETRON HCL 4 MG/2ML IJ SOLN: 4.0000 mg | Freq: Four times a day (QID) | INTRAMUSCULAR | Status: DC | PRN

## 2022-09-07 MED ORDER — GABAPENTIN 300 MG PO CAPS
300.0000 mg | ORAL_CAPSULE | Freq: Three times a day (TID) | ORAL | Status: DC
  Administered 2022-09-07 – 2022-09-10 (×9): 300 mg via ORAL
  Filled 2022-09-07 (×9): qty 1

## 2022-09-07 MED ORDER — NALOXONE HCL 4 MG/0.1ML NA LIQD: 1.0000 | Freq: Once | NASAL | Status: DC

## 2022-09-07 MED ORDER — HYDROMORPHONE HCL 1 MG/ML IJ SOLN
2.0000 mg | INTRAMUSCULAR | Status: AC
  Administered 2022-09-07: 2 mg via INTRAVENOUS
  Filled 2022-09-07: qty 2

## 2022-09-07 MED ORDER — VITAMIN D 25 MCG (1000 UNIT) PO TABS
1000.0000 [IU] | ORAL_TABLET | Freq: Every day | ORAL | Status: DC
  Administered 2022-09-07 – 2022-09-10 (×4): 1000 [IU] via ORAL
  Filled 2022-09-07 (×4): qty 1

## 2022-09-07 MED ORDER — KETOROLAC TROMETHAMINE 30 MG/ML IJ SOLN
30.0000 mg | Freq: Once | INTRAMUSCULAR | Status: AC
  Administered 2022-09-07: 30 mg via INTRAVENOUS
  Filled 2022-09-07: qty 1

## 2022-09-07 MED ORDER — SENNOSIDES-DOCUSATE SODIUM 8.6-50 MG PO TABS
1.0000 | ORAL_TABLET | Freq: Two times a day (BID) | ORAL | Status: DC
  Administered 2022-09-07 – 2022-09-10 (×7): 1 via ORAL
  Filled 2022-09-07 (×6): qty 1

## 2022-09-07 MED ORDER — ENOXAPARIN SODIUM 40 MG/0.4ML IJ SOSY: 40.0000 mg | PREFILLED_SYRINGE | INTRAMUSCULAR | Status: DC

## 2022-09-07 MED ORDER — HYDROCORTISONE 1 % EX OINT
1.0000 | TOPICAL_OINTMENT | Freq: Two times a day (BID) | CUTANEOUS | Status: DC
  Administered 2022-09-08 – 2022-09-09 (×2): 1 via TOPICAL
  Filled 2022-09-07: qty 28

## 2022-09-07 MED ORDER — DIPHENHYDRAMINE HCL 25 MG PO CAPS: 25.0000 mg | ORAL_CAPSULE | Freq: Four times a day (QID) | ORAL | Status: DC | PRN

## 2022-09-07 MED ORDER — MORPHINE SULFATE ER 15 MG PO TBCR
15.0000 mg | EXTENDED_RELEASE_TABLET | Freq: Two times a day (BID) | ORAL | Status: DC
  Administered 2022-09-07 – 2022-09-10 (×6): 15 mg via ORAL
  Filled 2022-09-07 (×6): qty 1

## 2022-09-07 MED ORDER — OLANZAPINE 5 MG PO TABS
5.0000 mg | ORAL_TABLET | Freq: Every day | ORAL | Status: DC
  Administered 2022-09-07 – 2022-09-09 (×3): 5 mg via ORAL
  Filled 2022-09-07 (×3): qty 1

## 2022-09-07 MED ORDER — SODIUM CHLORIDE 0.9% FLUSH: 9.0000 mL | INTRAVENOUS | Status: DC | PRN

## 2022-09-07 MED ORDER — DIPHENHYDRAMINE HCL 50 MG/ML IJ SOLN
25.0000 mg | Freq: Once | INTRAMUSCULAR | Status: AC
  Administered 2022-09-08: 25 mg via INTRAVENOUS
  Filled 2022-09-07: qty 1

## 2022-09-07 MED ORDER — HYDROXYZINE HCL 25 MG PO TABS: 25.0000 mg | ORAL_TABLET | Freq: Every day | ORAL | Status: DC | PRN

## 2022-09-07 MED ORDER — SODIUM CHLORIDE 0.9% IV SOLUTION: Freq: Once | INTRAVENOUS | Status: AC

## 2022-09-07 MED ORDER — DIPHENHYDRAMINE HCL 25 MG PO CAPS: 25.0000 mg | ORAL_CAPSULE | ORAL | Status: DC | PRN

## 2022-09-07 MED ORDER — APIXABAN 5 MG PO TABS: 5.0000 mg | ORAL_TABLET | Freq: Two times a day (BID) | ORAL | Status: DC

## 2022-09-07 MED ORDER — FOLIC ACID 1 MG PO TABS
1.0000 mg | ORAL_TABLET | Freq: Every day | ORAL | Status: DC
  Administered 2022-09-07 – 2022-09-10 (×4): 1 mg via ORAL
  Filled 2022-09-07 (×4): qty 1

## 2022-09-07 MED ORDER — HYDROMORPHONE 1 MG/ML IV SOLN
INTRAVENOUS | Status: DC
  Administered 2022-09-07: 30 mg via INTRAVENOUS
  Administered 2022-09-07: 3.5 mg via INTRAVENOUS
  Administered 2022-09-07: 5 mg via INTRAVENOUS
  Administered 2022-09-08: 4 mg via INTRAVENOUS
  Administered 2022-09-08: 7.5 mg via INTRAVENOUS
  Administered 2022-09-08: 2.5 mg via INTRAVENOUS
  Administered 2022-09-08: 30 mg via INTRAVENOUS
  Administered 2022-09-08: 3 mg via INTRAVENOUS
  Administered 2022-09-08: 5.5 mg via INTRAVENOUS
  Administered 2022-09-08: 2 mg via INTRAVENOUS
  Administered 2022-09-09: 8.5 mg via INTRAVENOUS
  Administered 2022-09-09: 9 mg via INTRAVENOUS
  Administered 2022-09-09: 30 mg via INTRAVENOUS
  Administered 2022-09-09: 7 mg via INTRAVENOUS
  Administered 2022-09-09: 5.5 mg via INTRAVENOUS
  Administered 2022-09-09: 2.5 mg via INTRAVENOUS
  Administered 2022-09-10: 1.5 mg via INTRAVENOUS
  Administered 2022-09-10: 7 mg via INTRAVENOUS
  Administered 2022-09-10: 30 mg via INTRAVENOUS
  Administered 2022-09-10: 4 mg via INTRAVENOUS
  Filled 2022-09-07 (×4): qty 30

## 2022-09-07 MED ORDER — HYDROXYUREA 500 MG PO CAPS
500.0000 mg | ORAL_CAPSULE | Freq: Two times a day (BID) | ORAL | Status: DC
  Administered 2022-09-07 – 2022-09-10 (×6): 500 mg via ORAL
  Filled 2022-09-07 (×6): qty 1

## 2022-09-07 MED ORDER — KETOROLAC TROMETHAMINE 15 MG/ML IJ SOLN
15.0000 mg | Freq: Four times a day (QID) | INTRAMUSCULAR | Status: DC
  Administered 2022-09-07 – 2022-09-10 (×12): 15 mg via INTRAVENOUS
  Filled 2022-09-07 (×12): qty 1

## 2022-09-07 MED ORDER — MIRTAZAPINE 15 MG PO TABS
7.5000 mg | ORAL_TABLET | Freq: Every evening | ORAL | Status: DC | PRN
  Administered 2022-09-08: 7.5 mg via ORAL
  Filled 2022-09-07: qty 1

## 2022-09-07 NOTE — ED Provider Notes (Signed)
Patient care transferred at signout from overnight EDP Dr Kenn File  Briefly this is a 32 year old male w/ hx of sickle cell disease presenting with concern for pain crisis.  Patient has been seen by multiple other hospitals and reports that he had been pending at admission at [facility] from visit yesterday, but had to go home.     Physical Exam  BP 119/79   Pulse (!) 103   Temp 98.2 F (36.8 C) (Temporal)   Resp 16   Ht 6\' 2"  (1.88 m)   Wt 56.7 kg   SpO2 98%   BMI 16.05 kg/m   Physical Exam  Procedures  Procedures  ED Course / MDM    Medical Decision Making Amount and/or Complexity of Data Reviewed Labs: ordered.  Risk Prescription drug management. Decision regarding hospitalization.   Patient is currently receiving 3 rounds of treatment for sickle cell pain crisis here in the ED.   Labs with reticulocyte count 15 (up from 13.2 at [place] on 7/22), hgb 7.1 (stable from 7.3 on 7/22).  WBC 16.2 (stable from WBC 15 on 7/22).  No fever, no chest evidence of acute chest or sepsis.  8 50 pm -patient continues to complain of intractable pain, and therefore will be admitted for pain control.  Admitted to Dr Mikeal Hawthorne.     Terald Sleeper, MD 09/07/22 [phone]

## 2022-09-07 NOTE — H&P (Signed)
History and Physical    Patient: Timothy Marks ZOX:[id number] DOB: 1990-11-14 DOA: 09/07/2022 DOS: the patient was seen and examined on 09/07/2022 PCP: Quentin Angst, MD  Patient coming from: {Point_of_Origin:26777}  Chief Complaint:  Chief Complaint  Patient presents with   Sickle Cell Pain Crisis   HPI: Timothy Marks is a 32 y.o. 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 His smoking use included cigarettes. He has never used smokeless tobacco. He reports current drug use. Frequency: 7.00 times per week. Drug: Marijuana. He reports that he does not drink alcohol.  Allergies  Allergen Reactions   Buprenorphine Hcl Hives   Levaquin [Levofloxacin] Itching   Meperidine Rash   Morphine Hives   Morphine And Codeine Hives   Tramadol Hives   Vancomycin Itching   Zosyn [Piperacillin Sod-Tazobactam So] Itching, Rash and Other (See Comments)    Has taken rocephin in the past    Family History  Problem Relation Age of Onset   Sickle cell trait Mother    Sickle cell trait Father     Prior to Admission medications   Medication Sig Start Date End Date Taking? Authorizing Provider  naloxone [facility]) nasal spray 4 mg/0.1 mL Place 1 spray into the nose once. 09/04/22  Yes [provider]  apixaban (ELIQUIS) 5 MG TABS tablet Take 10 mg by mouth 2 (two) times daily.    [provider]  cholecalciferol (VITAMIN D) 25 MCG (1000 UNIT) tablet Take 1,000 Units by mouth daily.    [provider]  diphenhydrAMINE (BENADRYL) 25 mg capsule Take 1 capsule (25 mg  total) by mouth every 6 (six) hours as needed for itching. 08/11/22   Massie Maroon, FNP  folic acid (FOLVITE) 1 MG tablet Take 1 tablet (1 mg total) by mouth daily. 07/05/21   Massie Maroon, FNP  gabapentin (NEURONTIN) 300 MG capsule TAKE 1 CAPSULE(300 MG) BY MOUTH THREE TIMES DAILY 08/24/21   Quentin Angst, MD  hydrocortisone 1 % ointment Apply 1 Application topically 2 (two) times daily. 10/14/21   Quentin Angst, MD  hydroxyurea (HYDREA) 500 MG capsule Take 1 capsule (500 mg total) by mouth 2 (two) times daily. May take with food to minimize GI side effects. 12/21/21   Massie Maroon, FNP  hydrOXYzine (ATARAX) 25 MG tablet Take 25 mg by mouth daily as needed for anxiety or vomiting. 05/19/21   [provider]  ibuprofen (ADVIL) 800 MG tablet Take 800 mg by mouth every 8 (eight) hours as needed.    [provider]  mirtazapine (REMERON) 7.5 MG tablet Take 7.5 mg by mouth at bedtime as needed (for sleep). 05/19/21   [provider]  morphine (MS CONTIN) 15 MG 12 hr tablet Take 1 tablet (15 mg total) by mouth every 12 (twelve) hours. 08/16/22   Massie Maroon, FNP  OLANZapine (ZYPREXA) 5 MG tablet Take 1 tablet (5 mg total) by mouth at bedtime. 05/07/21   Massie Maroon, FNP  oxyCODONE-acetaminophen (PERCOCET) 10-325 MG tablet Take 1 tablet by mouth every 6 (six)  hours as needed for pain. 09/09/22   Massie Maroon, FNP  rivaroxaban (XARELTO) 20 MG TABS tablet Take 1 tablet (20 mg total) by mouth daily with supper. Patient not taking: Reported on 09/05/2022 08/23/22   Massie Maroon, FNP    Physical Exam: Vitals:   09/07/22 0917 09/07/22 0930 09/07/22 1015 09/07/22 1114  BP:  108/65 106/66 110/63  Pulse:  95 88 91  Resp:    18  Temp: 98.2 F (36.8 C)   98.5 F (36.9 C)  TempSrc: Oral   Oral  SpO2:  97% 97% 95%  Weight:      Height:       *** Data Reviewed: {Tip this will not be part of the note when signed- Document your independent  interpretation of telemetry tracing, EKG, lab, Radiology test or any other diagnostic tests. Add any new diagnostic test ordered today. (Optional):26781} {Results:26384}  Assessment and Plan: No notes have been filed under this hospital service. Service: Hospitalist     Advance Care Planning:   Code Status: Prior ***  Consults: ***  Family Communication: ***  Severity of Illness: {Observation/Inpatient:21159}  AuthorLonia Blood, MD 09/07/2022 12:49 PM  For on call review [email or web address].

## 2022-09-07 NOTE — ED Notes (Signed)
George at [facility] will send transport for Bed Ready at [facility] 6E [address][id number](NS)

## 2022-09-07 NOTE — Progress Notes (Signed)
    Patient Name: Timothy Marks           DOB: November 28, 1990  MRN: [id number]      Admission Date: 09/07/2022  Attending Provider: Rometta Emery, MD  Primary Diagnosis: Sickle cell pain crisis [facility])   Level of care: Med-Surg    CROSS COVER NOTE   Date of Service   09/07/2022   [name], 32 y.o. male, was admitted on 09/07/2022 for Sickle cell pain crisis (HCC).    HPI/Events of Note   Anemia of Chronic Disease Sickle cell anemia Hemoglobin 7.1 -->  6.6  Baseline hemoglobin ~ 9-11 No acute changes reported.  Hemodynamically stable. No melena, hematochezia, or other bleeding reported tonight.    Interventions/ Plan   Blood transfusion, 1 unit PRBC        Anthoney Harada, DNP, [employer] [facility] [place]

## 2022-09-07 NOTE — Plan of Care (Signed)
  Problem: Clinical Measurements: Goal: Will remain free from infection Outcome: Progressing   Problem: Coping: Goal: Level of anxiety will decrease Outcome: Not Progressing   Problem: Pain Managment: Goal: General experience of comfort will improve Outcome: Not Progressing

## 2022-09-07 NOTE — ED Notes (Signed)
Attempted to call report. Nurse to call back [facility]

## 2022-09-07 NOTE — ED Triage Notes (Signed)
Pt c/o sickle cell pain in back, reports was at [facility] for same yesterday where they wanted to admit him but left due to "things to do today"

## 2022-09-07 NOTE — ED Provider Notes (Signed)
[place] EMERGENCY DEPARTMENT AT [facility] Provider Note   CSN: [id number] Arrival date & time: 09/07/22  0349     History  Chief Complaint  Patient presents with   Sickle Cell Pain Crisis    Timothy Marks is a 32 y.o. male.  The history is provided by the patient.  Sickle Cell Pain Crisis Location:  Back Severity:  Severe Onset quality:  Sudden Duration:  2 days Similar to previous crisis episodes: yes   Timing:  Constant Progression:  Waxing and waning Chronicity:  Recurrent Frequency of attacks:  Lauralee Evener Context: not infection   Relieved by:  Nothing Worsened by:  Nothing Ineffective treatments:  Prescription drugs Associated symptoms: no chest pain, no fever, no headaches and no swelling of legs   Patient with sickle cell disease with low back pain.  Has been seen recently at [facility] and then [facility] for same.      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 Authorizing Provider  apixaban (ELIQUIS) 5 MG TABS tablet Take 10 mg by mouth 2 (two) times daily.    [provider]  cholecalciferol (VITAMIN D) 25 MCG (1000 UNIT) tablet Take 1,000 Units by mouth daily.    [provider]  diphenhydrAMINE (BENADRYL) 25 mg capsule Take 1 capsule (25 mg total) by mouth every 6 (six) hours as needed for itching. 08/11/22   Massie Maroon, FNP  folic acid (FOLVITE) 1 MG tablet Take 1 tablet (1 mg total) by mouth daily. 07/05/21   Massie Maroon, FNP  gabapentin (NEURONTIN) 300 MG capsule TAKE 1 CAPSULE(300 MG) BY MOUTH THREE TIMES DAILY 08/24/21   Quentin Angst, MD  hydrocortisone 1 % ointment Apply 1 Application topically 2 (two) times daily.  10/14/21   Quentin Angst, MD  hydroxyurea (HYDREA) 500 MG capsule Take 1 capsule (500 mg total) by mouth 2 (two) times daily. May take with food to minimize GI side effects. 12/21/21   Massie Maroon, FNP  hydrOXYzine (ATARAX) 25 MG tablet Take 25 mg by mouth daily as needed for anxiety or vomiting. 05/19/21   [provider]  ibuprofen (ADVIL) 800 MG tablet Take 800 mg by mouth every 8 (eight) hours as needed.    [provider]  mirtazapine (REMERON) 7.5 MG tablet Take 7.5 mg by mouth at bedtime as needed (for sleep). 05/19/21   [provider]  morphine (MS CONTIN) 15 MG 12 hr tablet Take 1 tablet (15 mg total) by mouth every 12 (twelve) hours. 08/16/22   Massie Maroon, FNP  OLANZapine (ZYPREXA) 5 MG tablet Take 1 tablet (5 mg total) by mouth at bedtime. 05/07/21   Massie Maroon, FNP  oxyCODONE-acetaminophen (PERCOCET) 10-325 MG tablet Take 1 tablet by mouth every 6 (six) hours as needed for pain. 09/09/22   Massie Maroon, FNP  rivaroxaban (XARELTO) 20 MG TABS tablet Take 1 tablet (20 mg total) by mouth daily with supper. Patient not taking: Reported on 09/05/2022 08/23/22   Massie Maroon, FNP      Allergies    Buprenorphine hcl, Levaquin [levofloxacin], Meperidine, Morphine, Morphine and codeine, Tramadol, Vancomycin, and Zosyn [piperacillin sod-tazobactam so]  Review of Systems   Review of Systems  Constitutional:  Negative for fever.  HENT:  Negative for facial swelling.   Cardiovascular:  Negative for chest pain.  Genitourinary:  Negative for difficulty urinating.  Musculoskeletal:  Positive for back pain.  Neurological:  Negative for weakness and headaches.  All other systems reviewed and are negative.   Physical Exam Updated Vital Signs BP (!) 140/81 (BP Location: Right Arm)   Pulse (!) 106   Temp 98.2 F (36.8 C) (Temporal)   Resp 16   Ht 6\' 2"  (1.88 m)   Wt 56.7 kg   SpO2 100%   BMI 16.05 kg/m  Physical Exam Vitals and  nursing note reviewed.  Constitutional:      General: He is not in acute distress.    Appearance: He is well-developed. He is not diaphoretic.  HENT:     Head: Normocephalic and atraumatic.     Nose: Nose normal.  Eyes:     Conjunctiva/sclera: Conjunctivae normal.     Pupils: Pupils are equal, round, and reactive to light.  Cardiovascular:     Rate and Rhythm: Normal rate and regular rhythm.     Pulses: Normal pulses.     Heart sounds: Normal heart sounds.  Pulmonary:     Effort: Pulmonary effort is normal.     Breath sounds: Normal breath sounds. No wheezing or rales.  Abdominal:     General: Bowel sounds are normal.     Palpations: Abdomen is soft.     Tenderness: There is no abdominal tenderness. There is no guarding or rebound.  Musculoskeletal:        General: Normal range of motion.     Cervical back: Normal range of motion and neck supple.  Skin:    General: Skin is warm and dry.     Capillary Refill: Capillary refill takes less than 2 seconds.  Neurological:     General: No focal deficit present.     Mental Status: He is alert and oriented to person, place, and time.     Deep Tendon Reflexes: Reflexes normal.  Psychiatric:        Thought Content: Thought content normal.     ED Results / Procedures / Treatments   Labs (all labs ordered are listed, but only abnormal results are displayed) Results for orders placed or performed during the hospital encounter of 09/07/22  CBC with Differential  Result Value Ref Range   WBC PENDING 4.0 - 10.5 K/uL   RBC 2.33 (L) 4.22 - 5.81 MIL/uL   Hemoglobin 7.1 (L) 13.0 - 17.0 g/dL   HCT 44.0 (L) 34.7 - 42.5 %   MCV 90.6 80.0 - 100.0 fL   MCH 30.5 26.0 - 34.0 pg   MCHC 33.6 30.0 - 36.0 g/dL   RDW 95.6 (H) 38.7 - 56.4 %   Platelets 620 (H) 150 - 400 K/uL   nRBC PENDING 0.0 - 0.2 %   Neutrophils Relative % PENDING %   Neutro Abs PENDING 1.7 - 7.7 K/uL   Band Neutrophils PENDING %   Lymphocytes Relative PENDING %   Lymphs Abs  PENDING 0.7 - 4.0 K/uL   Monocytes Relative PENDING %   Monocytes Absolute PENDING 0.1 - 1.0 K/uL   Eosinophils Relative PENDING %   Eosinophils Absolute PENDING 0.0 - 0.5 K/uL   Basophils Relative PENDING %   Basophils Absolute PENDING 0.0 - 0.1 K/uL   WBC Morphology PENDING    RBC Morphology PENDING  Smear Review PENDING    Other PENDING %   nRBC PENDING 0 /100 WBC   Metamyelocytes Relative PENDING %   Myelocytes PENDING %   Promyelocytes Relative PENDING %   Blasts PENDING %   Immature Granulocytes PENDING %   Abs Immature Granulocytes PENDING 0.00 - 0.07 K/uL  Basic metabolic panel  Result Value Ref Range   Sodium 136 135 - 145 mmol/L   Potassium 3.8 3.5 - 5.1 mmol/L   Chloride 100 98 - 111 mmol/L   CO2 28 22 - 32 mmol/L   Glucose, Bld 104 (H) 70 - 99 mg/dL   BUN 9 6 - 20 mg/dL   Creatinine, Ser 1.61 (L) 0.61 - 1.24 mg/dL   Calcium 9.4 8.9 - 09.6 mg/dL   GFR, Estimated >04 >54 mL/min   Anion gap 8 5 - 15  Reticulocytes  Result Value Ref Range   Retic Ct Pct 15.2 (H) 0.4 - 3.1 %   RBC. 2.34 (L) 4.22 - 5.81 MIL/uL   Retic Count, Absolute 356.4 (H) 19.0 - 186.0 K/uL   Immature Retic Fract 48.8 (H) 2.3 - 15.9 %   No results found.  Radiology No results found.  Procedures Procedures    Medications Ordered in ED Medications  dextrose 5 % and 0.45 % NaCl infusion ( Intravenous New Bag/Given 09/07/22 0622)  HYDROmorphone (DILAUDID) injection 2 mg (has no administration in time range)  diphenhydrAMINE (BENADRYL) capsule 25 mg (has no administration in time range)  ondansetron (ZOFRAN) injection 4 mg (has no administration in time range)  HYDROmorphone (DILAUDID) injection 2 mg (2 mg Intravenous Given 09/07/22 0612)  HYDROmorphone (DILAUDID) injection 2 mg (2 mg Intravenous Given 09/07/22 0981)    ED Course/ Medical Decision Making/ A&P                             Medical Decision Making Patient with sickle cell disease who presents with typical low back pain    Amount and/or Complexity of Data Reviewed External Data Reviewed: labs and notes.    Details: Previous notes reviewed  Labs: ordered.    Details: Low hemoglobin 7.1, elevated platelets 620k.  Normal sodium 136, normal potassium 3.8, normal creatinine .55. Reticulocyte count percent 15.2 elevated, retic count 356.4,   Risk Prescription drug management. Parenteral controlled substances. Risk Details: Labs ordered, pain medication and IV fluids ordered per protocol     Final Clinical Impression(s) / ED Diagnoses Final diagnoses:  None   Signed out to Dr. Renaye Rakers pending protocol and reassessment  Rx / DC Orders ED Discharge Orders     None         Inette Doubrava, MD 09/07/22 1914

## 2022-09-08 DIAGNOSIS — D5700 Hb-SS disease with crisis, unspecified: Secondary | ICD-10-CM | POA: Diagnosis not present

## 2022-09-08 LAB — CBC WITH DIFFERENTIAL/PLATELET
Basophils Absolute: 0 10*3/uL (ref 0.0–0.1)
Basophils Relative: 0 %
Eosinophils Absolute: 0.1 10*3/uL (ref 0.0–0.5)
Eosinophils Relative: 1 %
HCT: 21.7 % — ABNORMAL LOW (ref 39.0–52.0)
Hemoglobin: 7.3 g/dL — ABNORMAL LOW (ref 13.0–17.0)
Immature Granulocytes: 1 %
Lymphocytes Relative: 13 %
Lymphs Abs: 2.1 10*3/uL (ref 0.7–4.0)
MCH: 29.8 pg (ref 26.0–34.0)
MCHC: 33.6 g/dL (ref 30.0–36.0)
MCV: 88.6 fL (ref 80.0–100.0)
Monocytes Absolute: 1.3 10*3/uL — ABNORMAL HIGH (ref 0.1–1.0)
Monocytes Relative: 8 %
Neutro Abs: 12.8 10*3/uL — ABNORMAL HIGH (ref 1.7–7.7)
Neutrophils Relative %: 77 %
Platelets: 539 10*3/uL — ABNORMAL HIGH (ref 150–400)
RBC: 2.45 MIL/uL — ABNORMAL LOW (ref 4.22–5.81)
RDW: 21.4 % — ABNORMAL HIGH (ref 11.5–15.5)
WBC: 16.4 10*3/uL — ABNORMAL HIGH (ref 4.0–10.5)
nRBC: 9 % — ABNORMAL HIGH (ref 0.0–0.2)

## 2022-09-08 LAB — BPAM RBC
Blood Product Expiration Date: 202408042359
ISSUE DATE / TIME: 202407250229
Unit Type and Rh: 600

## 2022-09-08 LAB — TYPE AND SCREEN
Donor AG Type: NEGATIVE
Unit division: 0

## 2022-09-08 LAB — HIV ANTIBODY (ROUTINE TESTING W REFLEX): HIV Screen 4th Generation wRfx: NONREACTIVE

## 2022-09-08 LAB — COMPREHENSIVE METABOLIC PANEL
ALT: 21 U/L (ref 0–44)
AST: 23 U/L (ref 15–41)
Albumin: 3.3 g/dL — ABNORMAL LOW (ref 3.5–5.0)
Alkaline Phosphatase: 68 U/L (ref 38–126)
Anion gap: 7 (ref 5–15)
BUN: 6 mg/dL (ref 6–20)
CO2: 25 mmol/L (ref 22–32)
Calcium: 8.5 mg/dL — ABNORMAL LOW (ref 8.9–10.3)
Chloride: 104 mmol/L (ref 98–111)
Creatinine, Ser: 0.41 mg/dL — ABNORMAL LOW (ref 0.61–1.24)
GFR, Estimated: >60 mL/min (ref >60)
Glucose, Bld: 127 mg/dL — ABNORMAL HIGH (ref 70–99)
Potassium: 3.3 mmol/L — ABNORMAL LOW (ref 3.5–5.1)
Total Bilirubin: 2.4 mg/dL — ABNORMAL HIGH (ref 0.3–1.2)
Total Protein: 7.2 g/dL (ref 6.5–8.1)

## 2022-09-08 MED ORDER — POTASSIUM CHLORIDE CRYS ER 20 MEQ PO TBCR
20.0000 meq | EXTENDED_RELEASE_TABLET | Freq: Once | ORAL | Status: AC
  Administered 2022-09-08: 20 meq via ORAL
  Filled 2022-09-08: qty 1

## 2022-09-08 NOTE — Plan of Care (Signed)

## 2022-09-08 NOTE — Progress Notes (Signed)
SICKLE CELL SERVICE PROGRESS NOTE  Timothy Marks ZOX:[id number] DOB: March 25, 1990 DOA: 09/07/2022 PCP: Quentin Angst, MD  Assessment/Plan: Principal Problem:   Sickle cell pain crisis (HCC) Active Problems:   Aggressive behavior   Protein-calorie malnutrition, severe (HCC)   Leukocytosis  Sickle cell pain crisis: Patient appears to be doing better but still in pain.  He is drowsy.  Still on Dilaudid PCA, Toradol and IV fluids.  Pain is down to 8 out of 10.  He was transfused a unit of packed red blood cells today.  We will continue current regimen. acute on chronic anemia: Hemoglobin was down to 6.6 yesterday.  Transfuse 1 unit packed red blood cells.  Currently at 7.3.  Patient's baseline hemoglobin has been between 8 and 9 g.  We may require another transfusion if not improved on its own by tomorrow. Leukocytosis: White count 16.4.  Most likely due to vaso-occlusive crisis.  He is also having hemolytic crisis.  Continue to monitor. 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  Continue to monitor Severe protein calorie malnutrition: Dietary counseling Bipolar disorder: Continue home regimen   Code Status: Full code Family Communication: No family at bedside Disposition Plan: Home  [facility]  Pager [phone]. If 7PM-7AM, please contact night-coverage.  09/08/2022, 9:09 AM  LOS: 1 day   Brief narrative: Timothy Marks is a 32 y.o. male with medical history significant of sickle cell disease, depression, generalized anxiety disorder, chronic pain syndrome, who [facility] with complaint of pain in his back and neck that has been going on for the last 3 days.  Pain is consistent with his typical sickle cell disease.  Associated with some mild shortness of breath.  Pain rated as 10 out of 10.  Mainly in his back and legs.  Patient was treated with IV Dilaudid 2 mg x 3 but still persists.  Does not appear to be improving.  No evidence of acute  chest syndrome.  His hemoglobin is a little below his baseline.  Reticulocyte count is elevated indicating possible hemolysis.  Patient therefore being admitted for management of sickle cell pain crisis.   Consultants: None  Procedures: Blood transfusion  Antibiotics: None  HPI/Subjective: Patient is drowsy in the morning.  Still having significant pain in his back and legs.  Slightly tachycardic otherwise pain is still at 9 out of 10.  Objective: Vitals:   09/08/22 0250 09/08/22 0359 09/08/22 0525 09/08/22 0740  BP: 124/67  118/70   Pulse: (!) 108  99   Resp: 19 (!) 22 18 14   Temp: 99 F (37.2 C)  98.4 F (36.9 C)   TempSrc: Oral  Oral   SpO2: 92% 90% 96% 96%  Weight:      Height:       Weight change:   Intake/Output Summary (Last 24 hours) at 09/08/2022 0909 Last data filed at 09/08/2022 5621 Gross per 24 hour  Intake 812.07 ml  Output 2500 ml  Net -1687.93 ml    General: Alert, awake, oriented x3, in no acute distress.  HEENT: [place]/AT PEERL, EOMI Neck: Trachea midline,  no masses, no thyromegal,y no JVD, no carotid bruit OROPHARYNX:  Moist, No exudate/ erythema/lesions.  Heart: Regular rate and rhythm, without murmurs, rubs, gallops, PMI non-displaced, no heaves or thrills on palpation.  Lungs: Clear to auscultation, no wheezing or rhonchi noted. No increased vocal fremitus resonant to percussion  Abdomen: Soft, nontender, nondistended, positive bowel sounds, no masses no hepatosplenomegaly noted..  Neuro: No focal neurological  deficits noted cranial nerves II through XII grossly intact. DTRs 2+ bilaterally upper and lower extremities. Strength 5 out of 5 in bilateral upper and lower extremities. Musculoskeletal: No warm swelling or erythema around joints, no spinal tenderness noted. Psychiatric: Patient alert and oriented x3, good insight and cognition, good recent to remote recall. Lymph node survey: No cervical axillary or inguinal lymphadenopathy noted.   Data  Reviewed: Basic Metabolic Panel: Recent Labs  Lab 09/07/22 0612 09/07/22 1407 09/08/22 0755  NA 136  --  136  K 3.8  --  3.3*  CL 100  --  104  CO2 28  --  25  GLUCOSE 104*  --  127*  BUN 9  --  6  CREATININE 0.55* 0.52* 0.41*  CALCIUM 9.4  --  8.5*   Liver Function Tests: Recent Labs  Lab 09/08/22 0755  AST 23  ALT 21  ALKPHOS 68  BILITOT 2.4*  PROT 7.2  ALBUMIN 3.3*   No results for input(s): "LIPASE", "AMYLASE" in the last 168 hours. No results for input(s): "AMMONIA" in the last 168 hours. CBC: Recent Labs  Lab 09/07/22 0612 09/07/22 1407 09/08/22 0755  WBC 16.2* 14.8* 16.4*  NEUTROABS 13.0*  --  PENDING  HGB 7.1* 6.6* 7.3*  HCT 21.1* 19.7* 21.7*  MCV 90.6 92.9 88.6  PLT 620* 556* 539*   Cardiac Enzymes: No results for input(s): "CKTOTAL", "CKMB", "CKMBINDEX", "TROPONINI" in the last 168 hours. BNP (last 3 results) No results for input(s): "BNP" in the last 8760 hours.  ProBNP (last 3 results) No results for input(s): "PROBNP" in the last 8760 hours.  CBG: No results for input(s): "GLUCAP" in the last 168 hours.  No results found for this or any previous visit (from the past 240 hour(s)).   Studies: No results found.  Scheduled Meds:  apixaban  10 mg Oral BID   [START ON 09/11/2022] apixaban  5 mg Oral BID   cholecalciferol  1,000 Units Oral Daily   folic acid  1 mg Oral Daily   gabapentin  300 mg Oral TID   hydrocortisone  1 Application Topical BID   HYDROmorphone   Intravenous Q4H   hydroxyurea  500 mg Oral BID   ketorolac  15 mg Intravenous Q6H   morphine  15 mg Oral Q12H   OLANZapine  5 mg Oral QHS   potassium chloride  20 mEq Oral Once   senna-docusate  1 tablet Oral BID   Continuous Infusions:  dextrose 5 % and 0.45 % NaCl 125 mL/hr at 09/08/22 0533    Principal Problem:   Sickle cell pain crisis [facility]) Active Problems:   Aggressive behavior   Protein-calorie malnutrition, severe (HCC)   Leukocytosis

## 2022-09-08 NOTE — Progress Notes (Signed)
   09/08/22 1552  TOC Brief Assessment  Patient has primary care physician Yes  Home environment has been reviewed Yes  Prior level of function: Independent  Prior/Current Home Services No current home services  Social Determinants of Health Reivew SDOH reviewed no interventions necessary  Readmission risk has been reviewed Yes  Transition of care needs no transition of care needs at this time

## 2022-09-09 DIAGNOSIS — D5700 Hb-SS disease with crisis, unspecified: Secondary | ICD-10-CM | POA: Diagnosis not present

## 2022-09-09 LAB — CBC WITH DIFFERENTIAL/PLATELET: Abs Immature Granulocytes: 0.07 10*3/uL (ref 0.00–0.07)

## 2022-09-09 NOTE — Progress Notes (Signed)
SICKLE CELL SERVICE PROGRESS NOTE  Timothy Marks:[id number] DOB: 1990/12/16 DOA: 09/07/2022 PCP: Quentin Angst, MD  Assessment/Plan: Principal Problem:   Sickle cell pain crisis (HCC) Active Problems:   Aggressive behavior   Protein-calorie malnutrition, severe (HCC)   Leukocytosis  Sickle cell pain crisis: Patient appears to be doing better but still in pain.  He is still on Dilaudid PCA, Toradol and IV fluids.  Pain is down to 8 out of 10.  He was transfused a unit of packed red blood cells and hemoglobin is doing better.  He is on additional Dilaudid doses 2 mg every 4 hours which I will discontinue.  We will continue current regimen. acute on chronic anemia: Hemoglobin is now stable at 7.2.  Since transfusion of 1 unit of packed red blood cells.  White count is 13.8.  Will continue to monitor. Leukocytosis: White count 16.4.  Most likely due to vaso-occlusive crisis.  He is also having hemolytic crisis.  Continue to monitor. 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  Continue to monitor Severe protein calorie malnutrition: Dietary counseling Bipolar disorder: Continue home regimen   Code Status: Full code Family Communication: No family at bedside Disposition Plan: Home  [facility]  Pager [phone]. If 7PM-7AM, please contact night-coverage.  09/09/2022, 3:57 PM  LOS: 2 days   Brief narrative: Timothy Marks is a 32 y.o. male with medical history significant of sickle cell disease, depression, generalized anxiety disorder, chronic pain syndrome, who presented to the [facility] room with complaint of pain in his back and neck that has been going on for the last 3 days.  Pain is consistent with his typical sickle cell disease.  Associated with some mild shortness of breath.  Pain rated as 10 out of 10.  Mainly in his back and legs.  Patient was treated with IV Dilaudid 2 mg x 3 but still persists.  Does not appear to be improving.  No evidence of acute  chest syndrome.  His hemoglobin is a little below his baseline.  Reticulocyte count is elevated indicating possible hemolysis.  Patient therefore being admitted for management of sickle cell pain crisis.   Consultants: None  Procedures: Blood transfusion  Antibiotics: None  HPI/Subjective: Patient is still having pain at 8 out of 10.  He is slightly drowsy in the mornings.  No fever or chills no nausea vomiting or diarrhea.  Objective: Vitals:   09/09/22 1006 09/09/22 1200 09/09/22 1246 09/09/22 1308  BP: 113/68   112/69  Pulse: 85   86  Resp: 20 14 14 20   Temp: 98.4 F (36.9 C)   98.2 F (36.8 C)  TempSrc: Oral   Oral  SpO2: 100% 97% 100% 99%  Weight:      Height:       Weight change:   Intake/Output Summary (Last 24 hours) at 09/09/2022 1557 Last data filed at 09/09/2022 1008 Gross per 24 hour  Intake 360 ml  Output 1675 ml  Net -1315 ml    General: Alert, awake, oriented x3, in no acute distress.  HEENT: [place]/AT PEERL, EOMI Neck: Trachea midline,  no masses, no thyromegal,y no JVD, no carotid bruit OROPHARYNX:  Moist, No exudate/ erythema/lesions.  Heart: Regular rate and rhythm, without murmurs, rubs, gallops, PMI non-displaced, no heaves or thrills on palpation.  Lungs: Clear to auscultation, no wheezing or rhonchi noted. No increased vocal fremitus resonant to percussion  Abdomen: Soft, nontender, nondistended, positive bowel sounds, no masses no hepatosplenomegaly noted..  Neuro: No focal neurological  deficits noted cranial nerves II through XII grossly intact. DTRs 2+ bilaterally upper and lower extremities. Strength 5 out of 5 in bilateral upper and lower extremities. Musculoskeletal: No warm swelling or erythema around joints, no spinal tenderness noted. Psychiatric: Patient alert and oriented x3, good insight and cognition, good recent to remote recall. Lymph node survey: No cervical axillary or inguinal lymphadenopathy noted.   Data Reviewed: Basic  Metabolic Panel: Recent Labs  Lab 09/07/22 0612 09/07/22 1407 09/08/22 0755 09/09/22 0753  NA 136  --  136 137  K 3.8  --  3.3* 4.1  CL 100  --  104 106  CO2 28  --  25 22  GLUCOSE 104*  --  127* 103*  BUN 9  --  6 7  CREATININE 0.55* 0.52* 0.41* 0.51*  CALCIUM 9.4  --  8.5* 8.1*   Liver Function Tests: Recent Labs  Lab 09/08/22 0755 09/09/22 0753  AST 23 21  ALT 21 21  ALKPHOS 68 65  BILITOT 2.4* 1.2  PROT 7.2 6.6  ALBUMIN 3.3* 3.1*   No results for input(s): "LIPASE", "AMYLASE" in the last 168 hours. No results for input(s): "AMMONIA" in the last 168 hours. CBC: Recent Labs  Lab 09/07/22 0612 09/07/22 1407 09/08/22 0755 09/09/22 0753  WBC 16.2* 14.8* 16.4* 13.8*  NEUTROABS 13.0*  --  12.8* 10.2*  HGB 7.1* 6.6* 7.3* 7.2*  HCT 21.1* 19.7* 21.7* 22.1*  MCV 90.6 92.9 88.6 91.7  PLT 620* 556* 539* 558*   Cardiac Enzymes: No results for input(s): "CKTOTAL", "CKMB", "CKMBINDEX", "TROPONINI" in the last 168 hours. BNP (last 3 results) No results for input(s): "BNP" in the last 8760 hours.  ProBNP (last 3 results) No results for input(s): "PROBNP" in the last 8760 hours.  CBG: No results for input(s): "GLUCAP" in the last 168 hours.  No results found for this or any previous visit (from the past 240 hour(s)).   Studies: No results found.  Scheduled Meds:  apixaban  10 mg Oral BID   [START ON 09/11/2022] apixaban  5 mg Oral BID   cholecalciferol  1,000 Units Oral Daily   folic acid  1 mg Oral Daily   gabapentin  300 mg Oral TID   hydrocortisone  1 Application Topical BID   HYDROmorphone   Intravenous Q4H   hydroxyurea  500 mg Oral BID   ketorolac  15 mg Intravenous Q6H   morphine  15 mg Oral Q12H   OLANZapine  5 mg Oral QHS   senna-docusate  1 tablet Oral BID   Continuous Infusions:  dextrose 5 % and 0.45 % NaCl 125 mL/hr at 09/09/22 1249    Principal Problem:   Sickle cell pain crisis (HCC) Active Problems:   Aggressive behavior    Protein-calorie malnutrition, severe (HCC)   Leukocytosis

## 2022-09-09 NOTE — Plan of Care (Signed)

## 2022-09-09 NOTE — Plan of Care (Signed)
Continues to report severe pain despite significant use of PCA and break-thru pain med.

## 2022-09-10 DIAGNOSIS — D5700 Hb-SS disease with crisis, unspecified: Secondary | ICD-10-CM | POA: Diagnosis not present

## 2022-09-10 LAB — BASIC METABOLIC PANEL
Anion gap: 11 (ref 5–15)
BUN: 7 mg/dL (ref 6–20)
CO2: 23 mmol/L (ref 22–32)
Calcium: 8.7 mg/dL — ABNORMAL LOW (ref 8.9–10.3)
Chloride: 106 mmol/L (ref 98–111)
Creatinine, Ser: 0.61 mg/dL (ref 0.61–1.24)
GFR, Estimated: >60 mL/min (ref >60)
Glucose, Bld: 132 mg/dL — ABNORMAL HIGH (ref 70–99)
Potassium: 5.1 mmol/L (ref 3.5–5.1)
Sodium: 140 mmol/L (ref 135–145)

## 2022-09-10 LAB — CBC
HCT: 22.2 % — ABNORMAL LOW (ref 39.0–52.0)
Hemoglobin: 7.5 g/dL — ABNORMAL LOW (ref 13.0–17.0)
MCH: 29.5 pg (ref 26.0–34.0)
MCHC: 33.8 g/dL (ref 30.0–36.0)
MCV: 87.4 fL (ref 80.0–100.0)
Platelets: 658 10*3/uL — ABNORMAL HIGH (ref 150–400)
RBC: 2.54 MIL/uL — ABNORMAL LOW (ref 4.22–5.81)
RDW: 21.2 % — ABNORMAL HIGH (ref 11.5–15.5)
WBC: 13.2 10*3/uL — ABNORMAL HIGH (ref 4.0–10.5)
nRBC: 2.8 % — ABNORMAL HIGH (ref 0.0–0.2)

## 2022-09-10 NOTE — Progress Notes (Signed)
SICKLE CELL SERVICE PROGRESS NOTE  Timothy Marks ONG:[id number] DOB: Mar 15, 1990 DOA: 09/07/2022 PCP: Quentin Angst, MD  Assessment/Plan: Principal Problem:   Sickle cell pain crisis (HCC) Active Problems:   Aggressive behavior   Protein-calorie malnutrition, severe (HCC)   Leukocytosis  Sickle cell pain crisis: Patient appears to be doing better but still in pain.  He is still on Dilaudid PCA, Toradol and IV fluids.  Pain is down to 7 out of 10.  He was transfused a unit of packed red blood cells and hemoglobin is doing better.  He is improved but patient has been awake today.  He has not been out of bed.  Encouraged to start walking around.   Acute on chronic anemia: Hemoglobin is now stable at 7.5.  Since transfusion of 1 unit of packed red blood cells.  White count is 13.2.  Will continue to monitor. Leukocytosis: White count 13.2.  Most likely due to vaso-occlusive crisis.  He is also having hemolytic crisis.  Continue to monitor. 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  Continue to monitor Severe protein calorie malnutrition: Dietary counseling Bipolar disorder: Continue home regimen   Code Status: Full code Family Communication: No family at bedside Disposition Plan: Home  [facility]  Pager [phone]. If 7PM-7AM, please contact night-coverage.  09/10/2022, 11:38 AM  LOS: 3 days   Brief narrative: Timothy Marks is a 32 y.o. male with medical history significant of sickle cell disease, depression, generalized anxiety disorder, chronic pain syndrome, who presented to the [facility] with complaint of pain in his back and neck that has been going on for the last 3 days.  Pain is consistent with his typical sickle cell disease.  Associated with some mild shortness of breath.  Pain rated as 10 out of 10.  Mainly in his back and legs.  Patient was treated with IV Dilaudid 2 mg x 3 but still persists.  Does not appear to be improving.  No evidence of  acute chest syndrome.  His hemoglobin is a little below his baseline.  Reticulocyte count is elevated indicating possible hemolysis.  Patient therefore being admitted for management of sickle cell pain crisis.   Consultants: None  Procedures: Blood transfusion  Antibiotics: None  HPI/Subjective: Patient is still having pain at 7 out of 10.  He is more awake and alert.  No new issues.  Objective: Vitals:   09/10/22 0409 09/10/22 0800 09/10/22 1021 09/10/22 1112  BP: (!) 109/59   116/74  Pulse: 100   90  Resp: 18 16 15 18   Temp: 99.2 F (37.3 C)   98.4 F (36.9 C)  TempSrc: Oral     SpO2: 100% 100% 98% 97%  Weight:      Height:       Weight change:   Intake/Output Summary (Last 24 hours) at 09/10/2022 1138 Last data filed at 09/10/2022 0825 Gross per 24 hour  Intake 7410.19 ml  Output 2500 ml  Net 4910.19 ml    General: Alert, awake, oriented x3, in no acute distress.  HEENT: [place]/AT PEERL, EOMI Neck: Trachea midline,  no masses, no thyromegal,y no JVD, no carotid bruit OROPHARYNX:  Moist, No exudate/ erythema/lesions.  Heart: Regular rate and rhythm, without murmurs, rubs, gallops, PMI non-displaced, no heaves or thrills on palpation.  Lungs: Clear to auscultation, no wheezing or rhonchi noted. No increased vocal fremitus resonant to percussion  Abdomen: Soft, nontender, nondistended, positive bowel sounds, no masses no hepatosplenomegaly noted..  Neuro: No focal neurological deficits noted  cranial nerves II through XII grossly intact. DTRs 2+ bilaterally upper and lower extremities. Strength 5 out of 5 in bilateral upper and lower extremities. Musculoskeletal: No warm swelling or erythema around joints, no spinal tenderness noted. Psychiatric: Patient alert and oriented x3, good insight and cognition, good recent to remote recall. Lymph node survey: No cervical axillary or inguinal lymphadenopathy noted.   Data Reviewed: Basic Metabolic Panel: Recent Labs  Lab  09/07/22 0612 09/07/22 1407 09/08/22 0755 09/09/22 0753 09/10/22 0849  NA 136  --  136 137 140  K 3.8  --  3.3* 4.1 5.1  CL 100  --  104 106 106  CO2 28  --  25 22 23   GLUCOSE 104*  --  127* 103* 132*  BUN 9  --  6 7 7   CREATININE 0.55* 0.52* 0.41* 0.51* 0.61  CALCIUM 9.4  --  8.5* 8.1* 8.7*   Liver Function Tests: Recent Labs  Lab 09/08/22 0755 09/09/22 0753  AST 23 21  ALT 21 21  ALKPHOS 68 65  BILITOT 2.4* 1.2  PROT 7.2 6.6  ALBUMIN 3.3* 3.1*   No results for input(s): "LIPASE", "AMYLASE" in the last 168 hours. No results for input(s): "AMMONIA" in the last 168 hours. CBC: Recent Labs  Lab 09/07/22 0612 09/07/22 1407 09/08/22 0755 09/09/22 0753 09/10/22 0849  WBC 16.2* 14.8* 16.4* 13.8* 13.2*  NEUTROABS 13.0*  --  12.8* 10.2*  --   HGB 7.1* 6.6* 7.3* 7.2* 7.5*  HCT 21.1* 19.7* 21.7* 22.1* 22.2*  MCV 90.6 92.9 88.6 91.7 87.4  PLT 620* 556* 539* 558* 658*   Cardiac Enzymes: No results for input(s): "CKTOTAL", "CKMB", "CKMBINDEX", "TROPONINI" in the last 168 hours. BNP (last 3 results) No results for input(s): "BNP" in the last 8760 hours.  ProBNP (last 3 results) No results for input(s): "PROBNP" in the last 8760 hours.  CBG: No results for input(s): "GLUCAP" in the last 168 hours.  No results found for this or any previous visit (from the past 240 hour(s)).   Studies: No results found.  Scheduled Meds:  apixaban  10 mg Oral BID   [START ON 09/11/2022] apixaban  5 mg Oral BID   cholecalciferol  1,000 Units Oral Daily   folic acid  1 mg Oral Daily   gabapentin  300 mg Oral TID   hydrocortisone  1 Application Topical BID   HYDROmorphone   Intravenous Q4H   hydroxyurea  500 mg Oral BID   ketorolac  15 mg Intravenous Q6H   morphine  15 mg Oral Q12H   OLANZapine  5 mg Oral QHS   senna-docusate  1 tablet Oral BID   Continuous Infusions:  dextrose 5 % and 0.45 % NaCl 50 mL/hr at 09/10/22 1610    Principal Problem:   Sickle cell pain crisis  (HCC) Active Problems:   Aggressive behavior   Protein-calorie malnutrition, severe (HCC)   Leukocytosis

## 2022-09-10 NOTE — Discharge Summary (Signed)
Physician Discharge Summary   Patient: Timothy Marks MRN: [id number] DOB: [birth year]  Admit date:     09/07/2022  Discharge date: {dischdate:26783}  Discharge Physician: Lonia Blood   PCP: Quentin Angst, MD   Recommendations at discharge:  {Tip this will not be part of the note when signed- Example include specific recommendations for outpatient follow-up, pending tests to follow-up on. (Optional):26781}  ***  Discharge Diagnoses: Principal Problem:   Sickle cell pain crisis (HCC) Active Problems:   Aggressive behavior   Protein-calorie malnutrition, severe (HCC)   Leukocytosis  Resolved Problems:   * No resolved hospital problems. [facility] Course: No notes on file  Assessment and Plan: No notes have been filed under this hospital service. Service: Hospitalist     {Tip this will not be part of the note when signed Body mass index is 16.05 kg/m. , ,  (Optional):26781}  {(NOTE) Pain control PDMP Statment (Optional):26782} Consultants: *** Procedures performed: ***  Disposition: {Plan; Disposition:26390} Diet recommendation:  {Diet_Plan:26776} DISCHARGE MEDICATION: Allergies as of 09/10/2022       Reactions   Buprenorphine Hcl Hives   Levaquin [levofloxacin] Itching   Meperidine Rash   Morphine Hives   Morphine And Codeine Hives   Tramadol Hives   Vancomycin Itching   Zosyn [piperacillin Sod-tazobactam So] Itching, Rash, Other (See Comments)   Has taken rocephin in the past        Medication List     ASK your doctor about these medications    cholecalciferol 25 MCG (1000 UNIT) tablet Commonly known as: VITAMIN D3 Take 1,000 Units by mouth daily.   diphenhydrAMINE 25 mg capsule Commonly known as: BENADRYL Take 1 capsule (25 mg total) by mouth every 6 (six) hours as needed for itching.   Eliquis 5 MG Tabs tablet Generic drug: apixaban Take 10 mg by mouth 2 (two) times daily.   folic acid 1 MG tablet Commonly known as: FOLVITE Take 1  tablet (1 mg total) by mouth daily.   gabapentin 300 MG capsule Commonly known as: NEURONTIN TAKE 1 CAPSULE(300 MG) BY MOUTH THREE TIMES DAILY   hydrocortisone 1 % ointment Apply 1 Application topically 2 (two) times daily.   hydroxyurea 500 MG capsule Commonly known as: HYDREA Take 1 capsule (500 mg total) by mouth 2 (two) times daily. May take with food to minimize GI side effects.   hydrOXYzine 25 MG tablet Commonly known as: ATARAX Take 25 mg by mouth daily as needed for anxiety or vomiting.   ibuprofen 800 MG tablet Commonly known as: ADVIL Take 800 mg by mouth every 8 (eight) hours as needed.   mirtazapine 7.5 MG tablet Commonly known as: REMERON Take 7.5 mg by mouth at bedtime as needed (for sleep).   morphine 15 MG 12 hr tablet Commonly known as: MS Contin Take 1 tablet (15 mg total) by mouth every 12 (twelve) hours.   naloxone 4 MG/0.1ML Liqd nasal spray kit Commonly known as: NARCAN Place 1 spray into the nose once.   OLANZapine 5 MG tablet Commonly known as: ZYPREXA Take 1 tablet (5 mg total) by mouth at bedtime.   oxyCODONE-acetaminophen 10-325 MG tablet Commonly known as: Percocet Take 1 tablet by mouth every 6 (six) hours as needed for pain.   rivaroxaban 20 MG Tabs tablet Commonly known as: Xarelto Take 1 tablet (20 mg total) by mouth daily with supper.        Discharge Exam: Filed Weights   09/07/22 0358  Weight: 56.7 kg   ***  Condition at discharge: {DC Condition:26389}  The results of significant diagnostics from this hospitalization (including imaging, microbiology, ancillary and laboratory) are listed below for reference.   Imaging Studies: No results found.  Microbiology: Results for orders placed or performed during the hospital encounter of 06/26/21  Culture, blood (routine x 2)     Status: None   Collection Time: 06/26/21  4:09 PM   Specimen: BLOOD  Result Value Ref Range Status   Specimen Description   Final    BLOOD  BLOOD LEFT FOREARM Performed at [facility], [address] [place], [place] [address]    Special Requests   Final    BOTTLES DRAWN AEROBIC ONLY Blood Culture adequate volume Performed at [facility], [address], [place], [place] [address]    Culture   Final    NO GROWTH 5 DAYS Performed at [facility], [address], [place], [place] [address]    Report Status 07/01/2021 FINAL  Final  Culture, blood (routine x 2)     Status: None   Collection Time: 06/26/21  4:31 PM   Specimen: BLOOD  Result Value Ref Range Status   Specimen Description   Final    BLOOD LEFT ANTECUBITAL Performed at [facility], [address] [place], [place] [address]    Special Requests   Final    BOTTLES DRAWN AEROBIC ONLY Blood Culture adequate volume Performed at [facility], [address], [place], [place] [address]    Culture   Final    NO GROWTH 5 DAYS Performed at [facility], [address] [place], [place] [address]    Report Status 07/01/2021 FINAL  Final    Labs: CBC: Recent Labs  Lab 09/07/22 0612 09/07/22 1407 09/08/22 0755 09/09/22 0753 09/10/22 0849  WBC 16.2* 14.8* 16.4* 13.8* 13.2*  NEUTROABS 13.0*  --  12.8* 10.2*  --   HGB 7.1* 6.6* 7.3* 7.2* 7.5*  HCT 21.1* 19.7* 21.7* 22.1* 22.2*  MCV 90.6 92.9 88.6 91.7 87.4  PLT 620* 556* 539* 558* 658*   Basic Metabolic Panel: Recent Labs  Lab 09/07/22 0612 09/07/22 1407 09/08/22 0755 09/09/22 0753 09/10/22 0849  NA 136  --  136 137 140  K 3.8  --  3.3* 4.1 5.1  CL 100  --  104 106 106  CO2 28  --  25 22 23   GLUCOSE 104*  --  127* 103* 132*  BUN 9  --  6 7 7   CREATININE 0.55* 0.52* 0.41* 0.51* 0.61  CALCIUM 9.4  --  8.5* 8.1* 8.7*   Liver Function Tests: Recent Labs  Lab 09/08/22 0755 09/09/22 0753  AST 23 21  ALT 21 21  ALKPHOS 68 65  BILITOT 2.4* 1.2  PROT 7.2 6.6  ALBUMIN 3.3* 3.1*   CBG: No results for  input(s): "GLUCAP" in the last 168 hours.  Discharge time spent: {LESS THAN/GREATER LKGM:01027} 30 minutes.  SignedLonia Blood, MD [facility] 09/10/2022

## 2022-09-10 NOTE — Progress Notes (Signed)
Pt left without being appropriately discharged. Requested to leave during shift. MD made aware. MD planned to discharge within the hour; but pt reported not being able to wait and had to leave. Left unit ambulatory and in a physically stable condition. No concerns verbalized at discharge.

## 2022-09-10 NOTE — Plan of Care (Signed)

## 2022-09-11 NOTE — Hospital Course (Signed)
Patient was admitted with sickle cell pain crisis.  Initially suspected acute chest syndrome.  Patient was however ruled out.  Was treated with IV Dilaudid PCA and Toradol.  Also transfused 1 unit of 1 unit packed red blood cells.  Patient was still being treated but became angry.  He wanted to go home.  While his discharge is being planned patient left AGAINST MEDICAL ADVICE.

## 2022-09-12 ENCOUNTER — Telehealth: Payer: Self-pay

## 2022-09-12 NOTE — Transitions of Care (Post Inpatient/ED Visit) (Unsigned)
   09/12/2022  Name: Braxdon Rosene MRN: [id number] DOB: 1990/07/09  Today's TOC FU Call Status: Today's TOC FU Call Status:: Unsuccessul Call (1st Attempt) Unsuccessful Call (1st Attempt) Date: 09/12/22  Attempted to reach the patient regarding the most recent Inpatient/ED visit.  Follow Up Plan: Additional outreach attempts will be made to reach the patient to complete the Transitions of Care (Post Inpatient/ED visit) call.   Signature   Woodfin Ganja LPN [facility] Nurse Health Advisor Direct Dial [phone]

## 2022-09-13 NOTE — Transitions of Care (Post Inpatient/ED Visit) (Signed)
09/13/2022  Name: Timothy Marks MRN: [id number] DOB: 1990/03/06  Today's TOC FU Call Status: Today's TOC FU Call Status:: Successful TOC FU Call Competed Unsuccessful Call (1st Attempt) Date: 09/12/22 [facility] FU Call Complete Date: 09/13/22  Transition Care Management Follow-up Telephone Call Date of Discharge: 09/10/22 Discharge Facility: [facility]) Type of Discharge: Inpatient Admission Primary Inpatient Discharge Diagnosis:: Sickle cell pain crisis How have you been since you were released from the hospital?: Better Any questions or concerns?: No  Items Reviewed: Did you receive and understand the discharge instructions provided?: Yes Medications obtained,verified, and reconciled?: Yes (Medications Reviewed) Any new allergies since your discharge?: No Dietary orders reviewed?: Yes Do you have support at home?: Yes  Medications Reviewed Today: Medications Reviewed Today     Reviewed by Merleen Nicely, LPN (Licensed Practical Nurse) on 09/13/22 at [phone]  Med List Status: <None>   Medication Order Taking? Sig Documenting Provider Last Dose Status Informant  apixaban (ELIQUIS) 5 MG TABS tablet [id number] Yes Take 10 mg by mouth 2 (two) times daily. [provider] Taking Active Self  cholecalciferol (VITAMIN D) 25 MCG (1000 UNIT) tablet [id number] Yes Take 1,000 Units by mouth daily. [provider] Taking Active Self  diphenhydrAMINE (BENADRYL) 25 mg capsule [id number] Yes Take 1 capsule (25 mg total) by mouth every 6 (six) hours as needed for itching. Massie Maroon, FNP Taking Active Self  folic acid (FOLVITE) 1 MG tablet [id number] Yes Take 1 tablet (1 mg total) by mouth daily. Massie Maroon, FNP Taking Active Self  gabapentin (NEURONTIN) 300 MG capsule [id number] Yes TAKE 1 CAPSULE(300 MG) BY MOUTH THREE TIMES DAILY  Patient taking differently: Take 300 mg by mouth 3 (three) times daily.   Quentin Angst, MD Taking Active Self  hydrocortisone  1 % ointment [id number] Yes Apply 1 Application topically 2 (two) times daily.  Patient taking differently: Apply 1 Application topically 2 (two) times daily as needed for itching.   Quentin Angst, MD Taking Active Self  hydroxyurea (HYDREA) 500 MG capsule [id number] Yes Take 1 capsule (500 mg total) by mouth 2 (two) times daily. May take with food to minimize GI side effects.  Patient taking differently: Take 500 mg by mouth 2 (two) times daily.   Massie Maroon, FNP Taking Active Self  hydrOXYzine (ATARAX) 25 MG tablet [id number] Yes Take 25 mg by mouth daily as needed for anxiety or vomiting. [provider] Taking Active Self  ibuprofen (ADVIL) 800 MG tablet [id number] Yes Take 800 mg by mouth every 8 (eight) hours as needed. [provider] Taking Active Self  mirtazapine (REMERON) 7.5 MG tablet [id number] Yes Take 7.5 mg by mouth at bedtime as needed (for sleep). [provider] Taking Active Self  morphine (MS CONTIN) 15 MG 12 hr tablet [id number] Yes Take 1 tablet (15 mg total) by mouth every 12 (twelve) hours. Massie Maroon, FNP Taking Active Self  naloxone [facility]) nasal spray 4 mg/0.1 mL [id number] Yes Place 1 spray into the nose once. [provider] Taking Active Self  OLANZapine (ZYPREXA) 5 MG tablet [id number] Yes Take 1 tablet (5 mg total) by mouth at bedtime. Massie Maroon, FNP Taking Active Self           Med Note (SATTERFIELD, Genoveva Ill   Wed Sep 07, 2022  2:52 PM) Patient verified he is taking this medication   oxyCODONE-acetaminophen (PERCOCET) 10-325 MG tablet [id number] Yes Take 1 tablet by mouth every 6 (six) hours  as needed for pain. Massie Maroon, FNP Taking Active Self  rivaroxaban (XARELTO) 20 MG TABS tablet [id number] Yes Take 1 tablet (20 mg total) by mouth daily with supper. Massie Maroon, FNP Taking Active Self            Home Care and Equipment/Supplies: Were Home Health Services Ordered?: NA Any new  equipment or medical supplies ordered?: NA  Functional Questionnaire: Do you need assistance with bathing/showering or dressing?: No Do you need assistance with meal preparation?: No Do you need assistance with eating?: No Do you have difficulty maintaining continence: No Do you need assistance with getting out of bed/getting out of a chair/moving?: No Do you have difficulty managing or taking your medications?: No  Follow up appointments reviewed: PCP Follow-up appointment confirmed?: Yes MD Provider Line [phone] Given: Yes Date of PCP follow-up appointment?: 09/20/22 Follow-up Provider: Julianne Handler FNP [facility]ist [facility] Follow-up appointment confirmed?: No Do you need transportation to your follow-up appointment?: No Do you understand care options if your condition(s) worsen?: Yes-patient verbalized understanding    SIGNATURE  Woodfin Ganja LPN [facility] Nurse Health Advisor Direct Dial [phone]

## 2022-09-14 ENCOUNTER — Other Ambulatory Visit: Payer: Self-pay | Admitting: Nurse Practitioner

## 2022-09-14 DIAGNOSIS — D571 Sickle-cell disease without crisis: Secondary | ICD-10-CM

## 2022-09-14 DIAGNOSIS — G894 Chronic pain syndrome: Secondary | ICD-10-CM

## 2022-09-14 MED ORDER — MORPHINE SULFATE ER 15 MG PO TBCR
15.0000 mg | EXTENDED_RELEASE_TABLET | Freq: Two times a day (BID) | ORAL | 0 refills | Status: DC
Start: 2022-09-15 — End: 2022-09-20

## 2022-09-14 NOTE — Progress Notes (Signed)
morphine (MS CONTIN) 15 MG 12 hr tablet [[id number]]  refilled after reviewed of PDMP. I call the patient to verify his medication allergies , Patient denies any adverse reaction to this medication.    Order Details  Dose: 15 mg Route: Oral Frequency: Every 12 hours  Dispense Quantity: 60 tablet Refills: 0        Sig: Take 1 tablet (15 mg total) by mouth every 12 (twelve) hours.       Start Date: 09/15/22 End Date: 10/15/22 after 60 doses  Written Date: 09/14/22 Expiration Date: 03/13/23  Earliest Fill Date: 09/15/22

## 2022-09-15 ENCOUNTER — Other Ambulatory Visit: Payer: Self-pay | Admitting: Family Medicine

## 2022-09-15 ENCOUNTER — Other Ambulatory Visit: Payer: Self-pay

## 2022-09-15 DIAGNOSIS — G894 Chronic pain syndrome: Secondary | ICD-10-CM

## 2022-09-15 NOTE — Telephone Encounter (Signed)
Please advise Kh 

## 2022-09-20 ENCOUNTER — Ambulatory Visit (INDEPENDENT_AMBULATORY_CARE_PROVIDER_SITE_OTHER): Payer: 59 | Admitting: Family Medicine

## 2022-09-20 ENCOUNTER — Encounter: Payer: Self-pay | Admitting: Family Medicine

## 2022-09-20 VITALS — BP 108/64 | HR 77 | Temp 97.3°F | Ht 74.0 in | Wt 124.4 lb

## 2022-09-20 DIAGNOSIS — G894 Chronic pain syndrome: Secondary | ICD-10-CM

## 2022-09-20 DIAGNOSIS — Z09 Encounter for follow-up examination after completed treatment for conditions other than malignant neoplasm: Secondary | ICD-10-CM | POA: Diagnosis not present

## 2022-09-20 DIAGNOSIS — D571 Sickle-cell disease without crisis: Secondary | ICD-10-CM

## 2022-09-20 DIAGNOSIS — Z86718 Personal history of other venous thrombosis and embolism: Secondary | ICD-10-CM

## 2022-09-20 DIAGNOSIS — E559 Vitamin D deficiency, unspecified: Secondary | ICD-10-CM

## 2022-09-20 DIAGNOSIS — M25552 Pain in left hip: Secondary | ICD-10-CM

## 2022-09-20 NOTE — Progress Notes (Signed)
Established Patient Office Visit  Subjective   Patient ID: Timothy Marks, male    DOB: 06/17/1990  Age: 32 y.o. MRN: [id number]  Chief Complaint  Patient presents with   Sickle Cell Anemia    Follow up    Timothy Marks is a 32 year old male with a medical history significant for sickle cell disease, chronic pain syndrome, opiate dependence and tolerance, and anemia of chronic disease presents for posthospital follow-up.  Patient was hospitalized at [facility] from 09/07/2022 - 09/10/2022 for sickle cell pain crisis.  While hospitalized, patient received a blood transfusion with PRBCs, without complications.  Patient states that pain intensity has been well-controlled since discharge.  Pain has been controlled with Percocet and MS Contin.  Patient states that MS Contin promotes itching and is requesting medication change on today.  Patient has requested changes in the past.  Discussed at length.  Will not increase the amount of short acting pain medications at this time. Patient has not been taking disease modifying agents, hydroxyurea consistently over the past several months.  He states that he has this medication at home.  Currently, pain intensity is 5/10.  Pain is primarily to his low back.  He denies headache, chest pain, shortness of breath, urinary symptoms, nausea, vomiting, or diarrhea.    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   Arm pain, diffuse, right 07/02/2021   Marijuana smoker 01/27/2021   Lab test positive for detection of COVID-19 virus 01/23/2020   Medication management 01/16/2020   Underweight 06/14/2019   Generalized weakness 03/31/2019   Sickle-cell crisis (HCC) 03/24/2019   SIRS (systemic inflammatory response syndrome) (HCC) 02/10/2019   Abnormal liver function 11/04/2018   Sickle cell anemia with crisis (HCC) 05/22/2018   Narcotic dependence (HCC) 02/10/2018   Acute bilateral low  back pain without sciatica    Other chronic pain    Anemia    Socially inappropriate behavior 12/02/2017   Thrombocythemia    Physical deconditioning    Agitation    Chronic pain syndrome    Recurrent cold sores    Fever of unknown origin    Drug-seeking behavior 08/30/2017   Pain    Sickle cell crisis (HCC) 07/16/2017   Adjustment disorder with mixed disturbance of emotions and conduct    Bilateral pulmonary infiltrates on chest x-ray 11/21/2016   History of DVT (deep vein thrombosis) 11/17/2016   Suicidal ideations 10/11/2016   Reticulocytosis 10/09/2016   DVT (deep venous thrombosis) (HCC) 08/02/2016   Has difficulty accessing primary care provider for most visits 07/11/2015   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   Hordeolum externum of left upper eyelid 05/31/2015   Major depressive disorder with current active episode 11/09/2014   Hb-SS disease with acute chest syndrome (HCC) 11/08/2014   Pneumonia of right upper lobe due to infectious organism 11/07/2014   Constipation 08/03/2014   Leukocytosis 06/30/2014   h/o Priapism 05/06/2014   Protein-calorie malnutrition, severe (HCC) 04/09/2014   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, left shoulder (HCC)    Vitamin D deficiency    Sickle cell anemia (HCC) 03/30/2014   Hyperbilirubinemia 03/30/2014   Hypokalemia 02/07/2014   Mandible fracture (HCC) 05/03/2013   Aggressive behavior 01/25/2013   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   Sickle cell anemia with pain (HCC) 01/24/2013   Sickle cell pain crisis (HCC) 01/24/2013   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  Other hemoglobinopathies (HCC) 04/22/2011   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   Sickle cell anemia (HCC)     Vitamin D deficiency y-6   Past Surgical History:  Procedure Laterality Date   CHOLECYSTECTOMY     Social History   Tobacco Use   Smoking status: Former    Types: Cigarettes   Smokeless tobacco: Never  Vaping Use   Vaping status: Never Used  Substance Use Topics   Alcohol use: No   Drug use: Yes    Frequency: 7.0 times per week    Types: Marijuana   Social History   Socioeconomic History   Marital status: Single    Spouse name: Not on file   Number of children: Not on file   Years of education: Not on file   Highest education level: 12th grade  Occupational History   Not on file  Tobacco Use   Smoking status: Former    Types: Cigarettes   Smokeless tobacco: Never  Vaping Use   Vaping status: Never Used  Substance and Sexual Activity   Alcohol use: No   Drug use: Yes    Frequency: 7.0 times per week    Types: Marijuana   Sexual activity: Yes  Other Topics Concern   Not on file  Social History Narrative   Not on file   Social Determinants of Health   Financial Resource Strain: Low Risk  (08/23/2022)   Overall Financial Resource Strain (CARDIA)    Difficulty of Paying Living Expenses: Not hard at all  Food Insecurity: Food Insecurity Present (09/07/2022)   Hunger Vital Sign    Worried About Running Out of Food in the Last Year: Never true    Ran Out of Food in the Last Year: Sometimes true  Transportation Needs: No Transportation Needs (09/07/2022)   PRAPARE - Administrator, Civil Service (Medical): No    Lack of Transportation (Non-Medical): No  Physical Activity: Sufficiently Active (08/23/2022)   Exercise Vital Sign    Days of Exercise per Week: 4 days    Minutes of Exercise per Session: 60 min  Stress: No Stress Concern Present (08/28/2022)   Received from [facility] of Occupational Health - Occupational Stress Questionnaire    Feeling of Stress : Not at all  Social Connections: Unknown (08/23/2022)   Social Connection  and Isolation Panel [NHANES]    Frequency of Communication with Friends and Family: Once a week    Frequency of Social Gatherings with Friends and Family: Once a week    Attends Religious Services: Never    Database administrator or Organizations: No    Attends Banker Meetings: Never    Marital Status: Patient declined  Catering manager Violence: Not At Risk (09/07/2022)   Humiliation, Afraid, Rape, and Kick questionnaire    Fear of Current or Ex-Partner: No    Emotionally Abused: No    Physically Abused: No    Sexually Abused: No   Family Status  Relation Name Status   Mother  Alive   Father  Alive  No partnership data on file   Family History  Problem Relation Age of Onset   Sickle cell trait Mother    Sickle cell trait Father    Allergies  Allergen Reactions  Buprenorphine Hcl Hives   Levaquin [Levofloxacin] Itching   Meperidine Rash   Morphine Hives   Morphine And Codeine Hives   Tramadol Hives   Vancomycin Itching   Zosyn [Piperacillin Sod-Tazobactam So] Itching, Rash and Other (See Comments)    Has taken rocephin in the past      Review of Systems  Constitutional: Negative.  Negative for diaphoresis.  HENT: Negative.    Respiratory: Negative.    Cardiovascular: Negative.   Gastrointestinal: Negative.   Genitourinary: Negative.   Musculoskeletal:  Positive for back pain.  Skin: Negative.   Neurological: Negative.   Psychiatric/Behavioral: Negative.        Objective:     BP 108/64   Pulse 77   Temp (!) 97.3 F (36.3 C)   Ht 6\' 2"  (1.88 m)   Wt 124 lb 6.4 oz (56.4 kg)   SpO2 100%   BMI 15.97 kg/m  BP Readings from Last 3 Encounters:  09/20/22 108/64  09/10/22 116/74  08/23/22 120/73   Wt Readings from Last 3 Encounters:  09/20/22 124 lb 6.4 oz (56.4 kg)  09/07/22 125 lb (56.7 kg)  08/23/22 116 lb 6.4 oz (52.8 kg)      Physical Exam Constitutional:      Appearance: Normal appearance.  Eyes:     Pupils: Pupils are equal,  round, and reactive to light.  Cardiovascular:     Rate and Rhythm: Normal rate.  Abdominal:     General: Bowel sounds are normal.  Musculoskeletal:        General: Normal range of motion.  Neurological:     General: No focal deficit present.     Mental Status: He is alert. Mental status is at baseline.  Psychiatric:        Mood and Affect: Mood normal.        Behavior: Behavior normal.        Thought Content: Thought content normal.        Judgment: Judgment normal.      No results found for any visits on 09/20/22.  Last CBC Lab Results  Component Value Date   WBC 13.2 (H) 09/10/2022   HGB 7.5 (L) 09/10/2022   HCT 22.2 (L) 09/10/2022   MCV 87.4 09/10/2022   MCH 29.5 09/10/2022   RDW 21.2 (H) 09/10/2022   PLT 658 (H) 09/10/2022   Last metabolic panel Lab Results  Component Value Date   GLUCOSE 132 (H) 09/10/2022   NA 140 09/10/2022   K 5.1 09/10/2022   CL 106 09/10/2022   CO2 23 09/10/2022   BUN 7 09/10/2022   CREATININE 0.61 09/10/2022   GFRNONAA >60 09/10/2022   CALCIUM 8.7 (L) 09/10/2022   PHOS 3.5 01/23/2020   PROT 6.6 09/09/2022   ALBUMIN 3.1 (L) 09/09/2022   LABGLOB 3.9 08/26/2022   AGRATIO 1.0 (L) 05/17/2022   BILITOT 1.2 09/09/2022   ALKPHOS 65 09/09/2022   AST 21 09/09/2022   ALT 21 09/09/2022   ANIONGAP 11 09/10/2022   Last lipids No results found for: "CHOL", "HDL", "LDLCALC", "LDLDIRECT", "TRIG", "CHOLHDL" Last hemoglobin A1c No results found for: "HGBA1C" Last thyroid functions Lab Results  Component Value Date   TSH 0.884 08/31/2018   T4TOTAL 8.1 08/31/2018   Last vitamin D Lab Results  Component Value Date   VD25OH CANCELED 08/26/2022   Last vitamin B12 and Folate No results found for: "VITAMINB12", "FOLATE"    The ASCVD Risk score (Arnett DK, et al., 2019) failed to calculate for  the following reasons:   The 2019 ASCVD risk score is only valid for ages 30 to 41    Assessment & Plan:   Problem List Items Addressed This  Visit       Other   Vitamin D deficiency (Chronic)   Chronic pain syndrome   Other Visit Diagnoses     Hospital discharge follow-up    -  Primary      1. Hospital discharge follow-up Patient has all prescription medications.  Will review labs as results become available.  No further discharge needs identified.  2. Vitamin D deficiency  - Sickle Cell Panel  3. Chronic pain syndrome Reviewed PDMP substance reporting system prior to prescribing opiate medications. No inconsistencies noted.    4. Left hip pain Patient is complaining consistent pain to his left hip.  Patient has been told in the past that he has left avascular necrosis.  He warrants further workup and evaluation by orthopedic specialist. - Ambulatory referral to Orthopedics  5. History of DVT (deep vein thrombosis) Continue Xarelto  6. Sickle cell disease without crisis (HCC) - Sickle Cell Panel  Return in about 3 months (around 12/21/2022) for sickle cell anemia.   [name]  APRN, MSN, FNP-C Patient Care [facility] [address] [place], [place] [address] [phone]

## 2022-09-22 ENCOUNTER — Other Ambulatory Visit: Payer: Self-pay | Admitting: Family Medicine

## 2022-09-22 ENCOUNTER — Other Ambulatory Visit: Payer: Self-pay

## 2022-09-22 DIAGNOSIS — G894 Chronic pain syndrome: Secondary | ICD-10-CM

## 2022-09-22 MED ORDER — OXYCODONE-ACETAMINOPHEN 10-325 MG PO TABS
1.0000 | ORAL_TABLET | Freq: Four times a day (QID) | ORAL | 0 refills | Status: DC | PRN
Start: 2022-09-25 — End: 2022-10-10

## 2022-09-22 NOTE — Telephone Encounter (Signed)
Please advise Kh 

## 2022-09-22 NOTE — Progress Notes (Signed)
Reviewed PDMP substance reporting system prior to prescribing opiate medications. No inconsistencies noted.  Meds ordered this encounter  Medications   oxyCODONE-acetaminophen (PERCOCET) 10-325 MG tablet    Sig: Take 1 tablet by mouth every 6 (six) hours as needed for pain.    Dispense:  60 tablet    Refill:  0    Order Specific Question:   Supervising Provider    Answer:   JEGEDE, OLUGBEMIGA E [[id number]]   Timothy Apodaca Moore Arliss Frisina  APRN, MSN, FNP-C [facility] [address]  [place], [place] [address] [phone]  

## 2022-09-23 NOTE — Telephone Encounter (Signed)
Caller & Relationship to patient:  MRN #  [id number]   Call Back Number:   Date of Last Office Visit: Visit date not found     Date of Next Office Visit: Visit date not found    Medication(s) to be Refilled: Percocet  Preferred Pharmacy:   ** Please notify patient to allow 48-72 hours to process** **Let patient know to contact pharmacy at the end of the day to make sure medication is ready. ** **If patient has not been seen in a year or longer, book an appointment **Advise to use MyChart for refill requests OR to contact their pharmacy

## 2022-10-02 ENCOUNTER — Other Ambulatory Visit: Payer: Self-pay

## 2022-10-02 ENCOUNTER — Emergency Department (HOSPITAL_BASED_OUTPATIENT_CLINIC_OR_DEPARTMENT_OTHER)
Admission: EM | Admit: 2022-10-02 | Discharge: 2022-10-02 | Disposition: A | Discharge status: Home / Self Care | Payer: 59 | Attending: Emergency Medicine | Admitting: Emergency Medicine

## 2022-10-02 ENCOUNTER — Encounter (HOSPITAL_BASED_OUTPATIENT_CLINIC_OR_DEPARTMENT_OTHER): Payer: Self-pay | Admitting: Emergency Medicine

## 2022-10-02 ENCOUNTER — Emergency Department (HOSPITAL_BASED_OUTPATIENT_CLINIC_OR_DEPARTMENT_OTHER): Payer: 59 | Admitting: Radiology

## 2022-10-02 DIAGNOSIS — Z7901 Long term (current) use of anticoagulants: Secondary | ICD-10-CM | POA: Insufficient documentation

## 2022-10-02 DIAGNOSIS — M545 Low back pain, unspecified: Secondary | ICD-10-CM | POA: Diagnosis not present

## 2022-10-02 DIAGNOSIS — D5700 Hb-SS disease with crisis, unspecified: Secondary | ICD-10-CM | POA: Diagnosis not present

## 2022-10-02 LAB — BASIC METABOLIC PANEL
Anion gap: 7 (ref 5–15)
BUN: 10 mg/dL (ref 6–20)
CO2: 24 mmol/L (ref 22–32)
Calcium: 9.9 mg/dL (ref 8.9–10.3)
Chloride: 102 mmol/L (ref 98–111)
Creatinine, Ser: 0.65 mg/dL (ref 0.61–1.24)
GFR, Estimated: >60 mL/min (ref >60)
Glucose, Bld: 104 mg/dL — ABNORMAL HIGH (ref 70–99)
Potassium: 5.4 mmol/L — ABNORMAL HIGH (ref 3.5–5.1)
Sodium: 133 mmol/L — ABNORMAL LOW (ref 135–145)

## 2022-10-02 LAB — CBC WITH DIFFERENTIAL/PLATELET
Abs Immature Granulocytes: 0 10*3/uL (ref 0.00–0.07)
Basophils Absolute: 0 10*3/uL (ref 0.0–0.1)
Basophils Relative: 0 %
Eosinophils Absolute: 0.3 10*3/uL (ref 0.0–0.5)
Eosinophils Relative: 5 %
HCT: 40.1 % (ref 39.0–52.0)
Hemoglobin: 13.8 g/dL (ref 13.0–17.0)
Lymphocytes Relative: 35 %
Lymphs Abs: 2 10*3/uL (ref 0.7–4.0)
MCH: 28.5 pg (ref 26.0–34.0)
MCHC: 34.4 g/dL (ref 30.0–36.0)
MCV: 82.7 fL (ref 80.0–100.0)
Monocytes Absolute: 0.4 10*3/uL (ref 0.1–1.0)
Monocytes Relative: 7 %
Neutro Abs: 3 10*3/uL (ref 1.7–7.7)
Neutrophils Relative %: 53 %
Platelets: 565 10*3/uL — ABNORMAL HIGH (ref 150–400)
RBC: 4.85 MIL/uL (ref 4.22–5.81)
RDW: 18 % — ABNORMAL HIGH (ref 11.5–15.5)
WBC: 5.6 10*3/uL (ref 4.0–10.5)
nRBC: 0.4 % — ABNORMAL HIGH (ref 0.0–0.2)
nRBC: 3 /100 WBC — ABNORMAL HIGH

## 2022-10-02 LAB — RETICULOCYTES
Immature Retic Fract: 26.2 % — ABNORMAL HIGH (ref 2.3–15.9)
RBC.: 4.86 MIL/uL (ref 4.22–5.81)
Retic Count, Absolute: 177.9 10*3/uL (ref 19.0–186.0)
Retic Ct Pct: 3.7 % — ABNORMAL HIGH (ref 0.4–3.1)

## 2022-10-02 MED ORDER — KCL IN DEXTROSE-NACL 20-5-0.45 MEQ/L-%-% IV SOLN
Freq: Once | INTRAVENOUS | Status: AC
  Filled 2022-10-02: qty 1000

## 2022-10-02 MED ORDER — HYDROMORPHONE HCL 1 MG/ML IJ SOLN
0.5000 mg | Freq: Once | INTRAMUSCULAR | Status: AC
  Administered 2022-10-02: 0.5 mg via INTRAVENOUS
  Filled 2022-10-02: qty 1

## 2022-10-02 NOTE — ED Provider Notes (Signed)
[place] EMERGENCY DEPARTMENT AT [facility] Provider Note   CSN: [id number] Arrival date & time: 10/02/22  1206     History  Chief Complaint  Patient presents with   Sickle Cell Pain Crisis    Timothy Marks is a 32 y.o. male.   Sickle Cell Pain Crisis   32 year old male presents emergency department with complaints of low back pain.  Patient states that he has a history of sickle cell anemia and typically has "pain crises" that affect his low back.  States that this feels "the exact same."  Reports pain beginning this morning after awakening.  Is on oral morphine as well as Percocet at home for pain of which she took this morning which did not significantly improve his symptoms although it helped some.  Presents emergency department for further assessment.  Denies any fever, saddle anesthesia, bowel/bladder dysfunction, weakness/sensory deficits in lower extremities, prolonged corticosteroid use, IV drug use.  Denies any abdominal pain, urinary symptoms.  States the pain is worsened with movement and relieved with rest.  Past medical history significant for sickle cell anemia, osteonecrosis of left shoulder, DVT on Xarelto, priapism, drug-seeking behavior  Home Medications Prior to Admission medications   Medication Sig Start Date End Date Taking? Authorizing Provider  apixaban (ELIQUIS) 5 MG TABS tablet Take 10 mg by mouth 2 (two) times daily.    [provider]  cholecalciferol (VITAMIN D) 25 MCG (1000 UNIT) tablet Take 1,000 Units by mouth daily.    [provider]  diphenhydrAMINE (BENADRYL) 25 mg capsule Take 1 capsule (25 mg total) by mouth every 6 (six) hours as needed for itching. 08/11/22   Massie Maroon, FNP  folic acid (FOLVITE) 1 MG tablet Take 1 tablet (1 mg total) by mouth daily. 07/05/21   Massie Maroon, FNP  gabapentin (NEURONTIN) 300 MG capsule TAKE 1 CAPSULE(300 MG) BY MOUTH THREE TIMES DAILY Patient taking differently: Take 300  mg by mouth 3 (three) times daily. 08/24/21   Quentin Angst, MD  hydrocortisone 1 % ointment Apply 1 Application topically 2 (two) times daily. Patient taking differently: Apply 1 Application topically 2 (two) times daily as needed for itching. 10/14/21   Quentin Angst, MD  hydroxyurea (HYDREA) 500 MG capsule Take 1 capsule (500 mg total) by mouth 2 (two) times daily. May take with food to minimize GI side effects. Patient taking differently: Take 500 mg by mouth 2 (two) times daily. 12/21/21   Massie Maroon, FNP  hydrOXYzine (ATARAX) 25 MG tablet Take 25 mg by mouth daily as needed for anxiety or vomiting. 05/19/21   [provider]  ibuprofen (ADVIL) 800 MG tablet Take 800 mg by mouth every 8 (eight) hours as needed.    [provider]  mirtazapine (REMERON) 7.5 MG tablet Take 7.5 mg by mouth at bedtime as needed (for sleep). 05/19/21   [provider]  naloxone [facility]) nasal spray 4 mg/0.1 mL Place 1 spray into the nose once. 09/04/22   [provider]  OLANZapine (ZYPREXA) 5 MG tablet Take 1 tablet (5 mg total) by mouth at bedtime. 05/07/21   Massie Maroon, FNP  oxyCODONE-acetaminophen (PERCOCET) 10-325 MG tablet Take 1 tablet by mouth every 6 (six) hours as needed for pain. 09/25/22   Massie Maroon, FNP  rivaroxaban (XARELTO) 20 MG TABS tablet Take 1 tablet (20 mg total) by mouth daily with supper. 08/23/22   Massie Maroon, FNP      Allergies  Buprenorphine hcl, Levaquin [levofloxacin], Meperidine, Morphine, Morphine and codeine, Tramadol, Vancomycin, and Zosyn [piperacillin sod-tazobactam so]    Review of Systems   Review of Systems  All other systems reviewed and are negative.   Physical Exam Updated Vital Signs BP 110/85   Pulse 70   Temp 97.7 F (36.5 C) (Oral)   Resp (!) 22   SpO2 100%  Physical Exam Vitals and nursing note reviewed.  Constitutional:      General: He is not in acute distress.    Appearance: He is  well-developed.  HENT:     Head: Normocephalic and atraumatic.  Eyes:     Conjunctiva/sclera: Conjunctivae normal.  Cardiovascular:     Rate and Rhythm: Normal rate and regular rhythm.     Heart sounds: No murmur heard. Pulmonary:     Effort: Pulmonary effort is normal. No respiratory distress.     Breath sounds: Normal breath sounds.  Abdominal:     Palpations: Abdomen is soft.     Tenderness: There is no abdominal tenderness.  Musculoskeletal:        General: No swelling.     Cervical back: Neck supple.     Comments: No midline tenderness of cervical, thoracic spine with some midline tenderness of the lower lumbar spine.  Paraspinal tenderness noted bilateral lumbar region.  Patient with full range of motion of bilateral hips, knees, ankles, digits.  Muscular strength 5 out of 5 for lower extremities.  No sensory deficits along major nerve distributions of lower extremities.  DTR symmetric at patella.  Pedal pulses 2+ bilaterally.  Skin:    General: Skin is warm and dry.     Capillary Refill: Capillary refill takes less than 2 seconds.  Neurological:     Mental Status: He is alert.  Psychiatric:        Mood and Affect: Mood normal.     ED Results / Procedures / Treatments   Labs (all labs ordered are listed, but only abnormal results are displayed) Labs Reviewed  CBC WITH DIFFERENTIAL/PLATELET - Abnormal; Notable for the following components:      Result Value   RDW 18.0 (*)    Platelets 565 (*)    nRBC 0.4 (*)    nRBC 3 (*)    All other components within normal limits  RETICULOCYTES - Abnormal; Notable for the following components:   Retic Ct Pct 3.7 (*)    Immature Retic Fract 26.2 (*)    All other components within normal limits  BASIC METABOLIC PANEL - Abnormal; Notable for the following components:   Sodium 133 (*)    Potassium 5.4 (*)    Glucose, Bld 104 (*)    All other components within normal limits    EKG None  Radiology DG Lumbar Spine  Complete  Result Date: 10/02/2022 CLINICAL DATA:  Lower back pain.  History of sickle cell anemia. EXAM: LUMBAR SPINE - COMPLETE 4+ VIEW COMPARISON:  Lumbar spine radiographs dated 09/29/2021. FINDINGS: There is no evidence of lumbar spine fracture. Alignment is normal. Intervertebral disc spaces are maintained. Multiple H-shaped vertebra are noted, similar in appearance to prior exam and likely reflecting sickle cell disease. IMPRESSION: 1. No acute findings. 2. Multiple H-shaped vertebra likely reflecting sickle cell disease. Electronically Signed   By: Romona Curls M.D.   On: 10/02/2022 13:43    Procedures Procedures    Medications Ordered in ED Medications  dextrose 5 % and 0.45 % NaCl with KCl 20 mEq/L infusion (0 mLs Intravenous  Stopped 10/02/22 1348)  HYDROmorphone (DILAUDID) injection 0.5 mg (0.5 mg Intravenous Given 10/02/22 1305)  HYDROmorphone (DILAUDID) injection 0.5 mg (0.5 mg Intravenous Given 10/02/22 1405)    ED Course/ Medical Decision Making/ A&P                                 Medical Decision Making Amount and/or Complexity of Data Reviewed Labs: ordered. Radiology: ordered.  Risk Prescription drug management.   This patient presents to the ED for concern of sickle cell pain/low back pain, this involves an extensive number of treatment options, and is a complaint that carries with it a high risk of complications and morbidity.  The differential diagnosis includes sickle cell pain, strain/pain, dislocation, pyelonephritis, nephrolithiasis, AAA, aortic dissection, cauda equina, fracture, aplastic crisis   Co morbidities that complicate the patient evaluation  See HPI   Additional history obtained:  Additional history obtained from EMR External records from outside source obtained and reviewed including hospital records   Lab Tests:  I Ordered, and personally interpreted labs.  The pertinent results include: No leukocytosis.  No evidence of anemia.   Thrombocytosis of 565.  Mild hyponatremia and hyperkalemia of 133 and 5.4 respectively.  No renal dysfunction.  Where she is like count percentage of 3.7 with immature   Imaging Studies ordered:  I ordered imaging studies including lumbar x-ray I independently visualized and interpreted imaging which showed no acute finding.  Multiple H shaped vertebrae I agree with the radiologist interpretation   Cardiac Monitoring: / EKG:  The patient was maintained on a cardiac monitor.  I personally viewed and interpreted the cardiac monitored which showed an underlying rhythm of: Sinus rhythm   Consultations Obtained:  N/a   Problem List / ED Course / Critical interventions / Medication management  Sickle cell anemia with pain, low back pain I ordered medication including D5 half-normal saline, hydromorphone   Reevaluation of the patient after these medicines showed that the patient improved I have reviewed the patients home medicines and have made adjustments as needed   Social Determinants of Health:  Former cigarette use.  Denies illicit drug use.   Test / Admission - Considered:  Sickle cell anemia with pain, low back pain Vitals signs within normal range and stable throughout visit. Laboratory/imaging studies significant for: See above 32 year old male presents emergency department with complaints of low back pain that began this morning after awakening.  Patient with history of sickle cell anemia and states this pain feels similar to prior sickle cell crises he is experienced in the past.  On exam, patient with some midline tenderness of his lumbar spine as well as paraspinal tenderness in the lower lumbar region.  Patient without red flag signs for low back pain on HPI/PE; low suspicion for cauda equina/spinal epidural abscess.  Patient without urinary symptoms, flank pain; low suspicion for pyelonephritis/nephrolithiasis.  Patient without pulse deficits, abdominal pain radiating to  back, neurologic deficits, significant hypertension; low suspicion for aortic dissection.  Suspect patient's symptoms are more musculoskeletal in origin.  Patient without evidence of aplastic crisis.  Treated with medications in the emergency department and did note significant improvement of symptoms.  Will recommend close follow-up with the sickle cell clinic in the outpatient setting.  Treatment plan discussed at length with patient and he acknowledged understanding was agreeable to said plan.  Patient overall well-appearing, afebrile no acute distress. Worrisome signs and symptoms were discussed with the  patient, and the patient acknowledged understanding to return to the ED if noticed. Patient was stable upon discharge.          Final Clinical Impression(s) / ED Diagnoses Final diagnoses:  Sickle cell anemia with pain (HCC)  Low back pain without sciatica, unspecified back pain laterality, unspecified chronicity    Rx / DC Orders ED Discharge Orders     None         Peter Garter, [place] 10/02/22 1459    Rondel Baton, MD 10/03/22 [phone]

## 2022-10-02 NOTE — ED Triage Notes (Signed)
Pt presents to ED POV. Pt c/o lower back pain that began this morning. Pt denies any triggers.

## 2022-10-02 NOTE — Discharge Instructions (Signed)
Note the workup today was overall reassuring.  X-ray and labs reassuring.  Recommend continuation of your at home pain medication and follow-up in the sickle cell clinic sometime this week.  Please do not hesitate to return to emergency department if the worrisome signs and symptoms we discussed become apparent.

## 2022-10-04 DIAGNOSIS — D5700 Hb-SS disease with crisis, unspecified: Secondary | ICD-10-CM | POA: Diagnosis not present

## 2022-10-10 ENCOUNTER — Other Ambulatory Visit: Payer: Self-pay | Admitting: Internal Medicine

## 2022-10-10 ENCOUNTER — Telehealth: Payer: Self-pay | Admitting: Internal Medicine

## 2022-10-10 ENCOUNTER — Other Ambulatory Visit (HOSPITAL_COMMUNITY): Payer: Self-pay

## 2022-10-10 DIAGNOSIS — G894 Chronic pain syndrome: Secondary | ICD-10-CM

## 2022-10-10 MED ORDER — OXYCODONE-ACETAMINOPHEN 10-325 MG PO TABS
1.0000 | ORAL_TABLET | Freq: Four times a day (QID) | ORAL | 0 refills | Status: DC | PRN
Start: 2022-10-10 — End: 2022-10-24

## 2022-10-10 NOTE — Telephone Encounter (Signed)
Percocet refill request 

## 2022-10-18 ENCOUNTER — Other Ambulatory Visit: Payer: Self-pay | Admitting: Family Medicine

## 2022-10-18 DIAGNOSIS — G894 Chronic pain syndrome: Secondary | ICD-10-CM

## 2022-10-18 DIAGNOSIS — D571 Sickle-cell disease without crisis: Secondary | ICD-10-CM

## 2022-10-18 MED ORDER — XTAMPZA ER 9 MG PO C12A
9.0000 mg | EXTENDED_RELEASE_CAPSULE | Freq: Two times a day (BID) | ORAL | 0 refills | Status: DC | PRN
Start: 2022-10-18 — End: 2022-11-03

## 2022-10-18 NOTE — Progress Notes (Signed)
Timothy Marks is a 32 year old male with a medical history significant for sickle cell disease, chronic pain syndrome, opiate dependence and tolerance, and anemia of chronic disease.  During previous clinic visit, discussed discontinuing long-acting morphine and starting a trial of Oxbryta 9 mg every 12 hours.  Medication has been sent to patient's pharmacy.  PDMP was reviewed prior to prescribing opiate medication and no inconsistencies were noted. Will continue to follow-up with patient as scheduled.  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 [place], [place] [address] [phone]

## 2022-10-19 ENCOUNTER — Telehealth: Payer: Self-pay | Admitting: Family Medicine

## 2022-10-19 DIAGNOSIS — G894 Chronic pain syndrome: Secondary | ICD-10-CM

## 2022-10-21 ENCOUNTER — Other Ambulatory Visit: Payer: Self-pay | Admitting: Family Medicine

## 2022-10-21 NOTE — Telephone Encounter (Signed)
Caller & Relationship to patient:  MRN #  [id number]   Call Back Number:   Date of Last Office Visit: 10/10/2022     Date of Next Office Visit: 11/01/2022    Medication(s) to be Refilled: Percocet  Preferred Pharmacy:   ** Please notify patient to allow 48-72 hours to process** **Let patient know to contact pharmacy at the end of the day to make sure medication is ready. ** **If patient has not been seen in a year or longer, book an appointment **Advise to use MyChart for refill requests OR to contact their pharmacy

## 2022-10-23 DIAGNOSIS — D5700 Hb-SS disease with crisis, unspecified: Secondary | ICD-10-CM | POA: Diagnosis not present

## 2022-10-23 DIAGNOSIS — Z87891 Personal history of nicotine dependence: Secondary | ICD-10-CM | POA: Diagnosis not present

## 2022-10-24 ENCOUNTER — Other Ambulatory Visit: Payer: Self-pay

## 2022-10-24 ENCOUNTER — Other Ambulatory Visit: Payer: Self-pay | Admitting: Family Medicine

## 2022-10-24 DIAGNOSIS — G894 Chronic pain syndrome: Secondary | ICD-10-CM

## 2022-10-24 MED ORDER — OXYCODONE-ACETAMINOPHEN 10-325 MG PO TABS
1.0000 | ORAL_TABLET | Freq: Four times a day (QID) | ORAL | 0 refills | Status: DC | PRN
Start: 2022-10-25 — End: 2022-11-08

## 2022-10-24 NOTE — Progress Notes (Signed)
Reviewed PDMP substance reporting system prior to prescribing opiate medications. No inconsistencies noted.  Meds ordered this encounter  Medications   oxyCODONE-acetaminophen (PERCOCET) 10-325 MG tablet    Sig: Take 1 tablet by mouth every 6 (six) hours as needed for up to 15 days for pain.    Dispense:  60 tablet    Refill:  0    Order Specific Question:   Supervising Provider    Answer:   JEGEDE, OLUGBEMIGA E [[id number]]   Timothy Moore Hollis  APRN, MSN, FNP-C [facility] [address]  [place], [place] [address] [phone]  

## 2022-10-24 NOTE — Telephone Encounter (Signed)
Please advise Kh 

## 2022-11-01 ENCOUNTER — Encounter: Payer: Self-pay | Admitting: Family Medicine

## 2022-11-01 ENCOUNTER — Ambulatory Visit (INDEPENDENT_AMBULATORY_CARE_PROVIDER_SITE_OTHER): Payer: 59 | Admitting: Family Medicine

## 2022-11-01 VITALS — BP 108/65 | HR 89 | Wt 125.4 lb

## 2022-11-01 DIAGNOSIS — D571 Sickle-cell disease without crisis: Secondary | ICD-10-CM | POA: Diagnosis not present

## 2022-11-01 DIAGNOSIS — E559 Vitamin D deficiency, unspecified: Secondary | ICD-10-CM | POA: Diagnosis not present

## 2022-11-01 DIAGNOSIS — Z23 Encounter for immunization: Secondary | ICD-10-CM | POA: Diagnosis not present

## 2022-11-01 DIAGNOSIS — G894 Chronic pain syndrome: Secondary | ICD-10-CM | POA: Diagnosis not present

## 2022-11-01 MED ORDER — MORPHINE SULFATE ER 15 MG PO TBCR
15.0000 mg | EXTENDED_RELEASE_TABLET | Freq: Two times a day (BID) | ORAL | 0 refills | Status: DC
Start: 2022-11-01 — End: 2022-11-21

## 2022-11-01 NOTE — Progress Notes (Signed)
Established Patient Office Visit  Subjective   Patient ID: Timothy Marks, male    DOB: 1990/07/04  Age: 32 y.o. MRN: [id number]  Chief Complaint  Patient presents with   Sickle Cell Anemia    Follow up   Medical Management of Chronic Issues    Timothy Marks is a 32 year old male with a medical history significant for sickle cell disease, chronic pain syndrome, opiate dependence and tolerance, and history of anemia of chronic disease that presents for follow-up of sickle cell disease and medication management.  Patient was started on Xtampza 1 month ago and states that medication was ineffective.  It makes him "too sleepy".  Patient is requesting to restart his MS Contin, which he complains that it makes him itch.  He tolerated MS Contin for quite some time, with Benadryl as a premedication.  Patient states that pain intensity is controlled today on current medication regimen.  He last had Percocet on last night with maximum relief.  Patient rates his pain as 3/10 primarily to his low back. He denies any fever, chills, chest pain, or shortness of breath.    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   Arm pain, diffuse, right 07/02/2021   Marijuana smoker 01/27/2021   Lab test positive for detection of COVID-19 virus 01/23/2020   Medication management 01/16/2020   Underweight 06/14/2019   Generalized weakness 03/31/2019   Sickle-cell crisis (HCC) 03/24/2019   SIRS (systemic inflammatory response syndrome) (HCC) 02/10/2019   Abnormal liver function 11/04/2018   Sickle cell anemia with crisis (HCC) 05/22/2018   Narcotic dependence (HCC) 02/10/2018   Acute bilateral low back pain without sciatica    Other chronic pain    Anemia    Socially inappropriate behavior 12/02/2017   Thrombocythemia    Physical deconditioning    Agitation    Chronic pain syndrome    Recurrent cold sores    Fever of unknown origin     Drug-seeking behavior 08/30/2017   Pain    Sickle cell crisis (HCC) 07/16/2017   Adjustment disorder with mixed disturbance of emotions and conduct    Bilateral pulmonary infiltrates on chest x-ray 11/21/2016   History of DVT (deep vein thrombosis) 11/17/2016   Suicidal ideations 10/11/2016   Reticulocytosis 10/09/2016   DVT (deep venous thrombosis) (HCC) 08/02/2016   Has difficulty accessing primary care provider for most visits 07/11/2015   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   Hordeolum externum of left upper eyelid 05/31/2015   Major depressive disorder with current active episode 11/09/2014   Hb-SS disease with acute chest syndrome (HCC) 11/08/2014   Pneumonia of right upper lobe due to infectious organism 11/07/2014   Constipation 08/03/2014   Leukocytosis 06/30/2014   h/o Priapism 05/06/2014   Protein-calorie malnutrition, severe (HCC) 04/09/2014   Osteonecrosis in diseases classified elsewhere, left shoulder (HCC)    Vitamin D deficiency    Sickle cell anemia (HCC) 03/30/2014   Hyperbilirubinemia 03/30/2014   Hypokalemia 02/07/2014   Mandible fracture (HCC) 05/03/2013   Aggressive behavior 01/25/2013   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   Sickle cell anemia with pain (HCC) 01/24/2013   Sickle cell pain crisis (HCC) 01/24/2013   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   Other hemoglobinopathies (HCC) 04/22/2011   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   Past Medical History:  Diagnosis Date   Depression    Generalized weakness 03/31/2019   Osteonecrosis in  diseases classified elsewhere, 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   Sickle cell anemia (HCC)    Vitamin D deficiency y-6   Past Surgical History:  Procedure Laterality Date   CHOLECYSTECTOMY     Social History   Tobacco Use   Smoking status: Former    Types: Cigarettes   Smokeless tobacco: Never  Vaping Use   Vaping status:  Never Used  Substance Use Topics   Alcohol use: No   Drug use: Yes    Frequency: 7.0 times per week    Types: Marijuana   Social History   Socioeconomic History   Marital status: Single    Spouse name: Not on file   Number of children: Not on file   Years of education: Not on file   Highest education level: 12th grade  Occupational History   Not on file  Tobacco Use   Smoking status: Former    Types: Cigarettes   Smokeless tobacco: Never  Vaping Use   Vaping status: Never Used  Substance and Sexual Activity   Alcohol use: No   Drug use: Yes    Frequency: 7.0 times per week    Types: Marijuana   Sexual activity: Yes  Other Topics Concern   Not on file  Social History Narrative   Not on file   Social Determinants of Health   Financial Resource Strain: Low Risk  (08/23/2022)   Overall Financial Resource Strain (CARDIA)    Difficulty of Paying Living Expenses: Not hard at all  Food Insecurity: Food Insecurity Present (09/07/2022)   Hunger Vital Sign    Worried About Running Out of Food in the Last Year: Never true    Ran Out of Food in the Last Year: Sometimes true  Transportation Needs: No Transportation Needs (09/07/2022)   PRAPARE - Administrator, Civil Service (Medical): No    Lack of Transportation (Non-Medical): No  Physical Activity: Sufficiently Active (08/23/2022)   Exercise Vital Sign    Days of Exercise per Week: 4 days    Minutes of Exercise per Session: 60 min  Stress: No Stress Concern Present (08/28/2022)   Received from [facility] - Occupational Stress Questionnaire    Feeling of Stress : Not at all  Social Connections: Unknown (08/23/2022)   Social Connection and Isolation Panel [NHANES]    Frequency of Communication with Friends and Family: Once a week    Frequency of Social Gatherings with Friends and Family: Once a week    Attends Religious Services: Never    Database administrator or  Organizations: No    Attends Banker Meetings: Never    Marital Status: Patient declined  Catering manager Violence: Not At Risk (09/07/2022)   Humiliation, Afraid, Rape, and Kick questionnaire    Fear of Current or Ex-Partner: No    Emotionally Abused: No    Physically Abused: No    Sexually Abused: No   Family Status  Relation Name Status   Mother  Alive   Father  Alive  No partnership data on file   Family History  Problem Relation Age of Onset   Sickle cell trait Mother    Sickle cell trait Father    Allergies  Allergen Reactions   Buprenorphine Hcl Hives   Levaquin [Levofloxacin] Itching   Meperidine Rash   Morphine Hives   Morphine And Codeine Hives   Tramadol Hives   Vancomycin Itching  Zosyn [Piperacillin Sod-Tazobactam So] Itching, Rash and Other (See Comments)    Has taken rocephin in the past      Review of Systems  Constitutional: Negative.   HENT: Negative.    Eyes: Negative.   Respiratory: Negative.    Cardiovascular: Negative.   Genitourinary: Negative.   Musculoskeletal:  Positive for back pain.  Skin: Negative.   Psychiatric/Behavioral: Negative.        Objective:     BP 108/65   Pulse 89   Wt 125 lb 6.4 oz (56.9 kg)   SpO2 100%   BMI 16.10 kg/m  BP Readings from Last 3 Encounters:  11/01/22 108/65  10/02/22 110/85  09/20/22 108/64   Wt Readings from Last 3 Encounters:  11/01/22 125 lb 6.4 oz (56.9 kg)  09/20/22 124 lb 6.4 oz (56.4 kg)  09/07/22 125 lb (56.7 kg)      Physical Exam Constitutional:      Appearance: Normal appearance.  Eyes:     Pupils: Pupils are equal, round, and reactive to light.  Cardiovascular:     Rate and Rhythm: Normal rate and regular rhythm.     Pulses: Normal pulses.     Heart sounds: Normal heart sounds.  Pulmonary:     Effort: Pulmonary effort is normal.     Breath sounds: Normal breath sounds.  Abdominal:     General: Bowel sounds are normal.  Musculoskeletal:        General:  Normal range of motion.  Neurological:     General: No focal deficit present.     Mental Status: Mental status is at baseline.  Psychiatric:        Mood and Affect: Mood normal.        Behavior: Behavior normal.        Thought Content: Thought content normal.        Judgment: Judgment normal.      No results found for any visits on 11/01/22.  Last CBC Lab Results  Component Value Date   WBC 5.6 10/02/2022   HGB 13.8 10/02/2022   HCT 40.1 10/02/2022   MCV 82.7 10/02/2022   MCH 28.5 10/02/2022   RDW 18.0 (H) 10/02/2022   PLT 565 (H) 10/02/2022   Last metabolic panel Lab Results  Component Value Date   GLUCOSE 104 (H) 10/02/2022   NA 133 (L) 10/02/2022   K 5.4 (H) 10/02/2022   CL 102 10/02/2022   CO2 24 10/02/2022   BUN 10 10/02/2022   CREATININE 0.65 10/02/2022   GFRNONAA >60 10/02/2022   CALCIUM 9.9 10/02/2022   PHOS 3.5 01/23/2020   PROT 8.3 09/20/2022   ALBUMIN 4.5 09/20/2022   LABGLOB 3.8 09/20/2022   AGRATIO 1.0 (L) 05/17/2022   BILITOT 0.8 09/20/2022   ALKPHOS 98 09/20/2022   AST 25 09/20/2022   ALT 9 09/20/2022   ANIONGAP 7 10/02/2022   Last lipids No results found for: "CHOL", "HDL", "LDLCALC", "LDLDIRECT", "TRIG", "CHOLHDL" Last hemoglobin A1c No results found for: "HGBA1C" Last thyroid functions Lab Results  Component Value Date   TSH 0.884 08/31/2018   T4TOTAL 8.1 08/31/2018   Last vitamin D Lab Results  Component Value Date   VD25OH 40.7 09/20/2022   Last vitamin B12 and Folate No results found for: "VITAMINB12", "FOLATE"    The ASCVD Risk score (Arnett DK, et al., 2019) failed to calculate for the following reasons:   The 2019 ASCVD risk score is only valid for ages 65 to 84  Assessment & Plan:   Problem List Items Addressed This Visit       Other   Vitamin D deficiency (Chronic)   Sickle cell anemia (HCC) (Chronic)   Relevant Medications   morphine (MS CONTIN) 15 MG 12 hr tablet   Other Relevant Orders   [id number]  11+Oxyco+Alc+Crt-Bund   Chronic pain syndrome   Relevant Medications   morphine (MS CONTIN) 15 MG 12 hr tablet   Other Relevant Orders   [id number] 11+Oxyco+Alc+Crt-Bund   Other Visit Diagnoses     Influenza vaccine needed    -  Primary   Relevant Orders   Flu vaccine trivalent PF, 6mos and older(Flulaval,Afluria,Fluarix,Fluzone)     1. Chronic pain syndrome Will restart MS Contin 15 mg every 12 hours.  Patient advised to continue taking Benadryl every 6 hours as needed as a premedication to prevent pruritus. Reviewed PDMP substance reporting system prior to prescribing opiate medications. No inconsistencies noted.   - morphine (MS CONTIN) 15 MG 12 hr tablet; Take 1 tablet (15 mg total) by mouth every 12 (twelve) hours.  Dispense: 30 tablet; Refill: 0 - [id number] 11+Oxyco+Alc+Crt-Bund  2. Sickle cell disease without crisis (HCC)  - morphine (MS CONTIN) 15 MG 12 hr tablet; Take 1 tablet (15 mg total) by mouth every 12 (twelve) hours.  Dispense: 30 tablet; Refill: 0 - [id number] 11+Oxyco+Alc+Crt-Bund  3. Influenza vaccine needed  - Flu vaccine trivalent PF, 6mos and older(Flulaval,Afluria,Fluarix,Fluzone)  4. Vitamin D deficiency Continue vitamin D supplementation. Patient will follow-up in 3 months  Julianne Handler, FNP [name]  APRN, MSN, FNP-C Patient Care [facility] [address] [place], [place] [address] [phone]

## 2022-11-01 NOTE — Patient Instructions (Signed)
Living With Sickle Cell Disease Living with a long-term condition, such as sickle cell disease, can be a challenge. It can affect both your physical and mental health. You may not have total control over your condition. But proper care and treatment can help manage the effects of the disease so you can feel good and lead an active life. You can take steps to manage your condition and stay as healthy as possible. How does sickle cell disease affect me? Sickle cell disease can cause challenges that affect your quality of life. You may get sick more often as a result of organ damage and infections. Sometimes you may need to stay in the hospital. Learn how to recognize that you are not feeling well and that you may be getting sick. What actions can I take to manage my condition?  The goals of treatment are to control your symptoms and prevent and treat problems. Work with your health care provider to create a treatment plan that works for you. Taking an active role in managing your condition can help you feel more in control of your situation. Ask about possible side effects of medicines that your health care provider recommends. Discuss how you feel about having those side effects. Keeping a healthy lifestyle can help you manage your condition. This includes eating a healthy diet, getting enough sleep, and getting regular exercise. Sickle cell disease may affect your ability to take care of your basic needs. Tell your health care provider if you have concerns about any of these needs: Access to food. Housing. Safe drinking water and other utilities. Safety in your home and community. Work or school. Transportation. Paying for health care. Your health care provider may be able to connect you with community resources that can help you. How to manage stress  Living with sickle cell disease can be stressful. This disease can have a big impact on your mental health. Talk with your health care provider  about ways to reduce your stress or if you have concerns about your mental health.  To cope with stress, try: Keeping a stress diary. This can help you learn what causes your stress to start (figure out your triggers) and how to control your response to those triggers. Spending time doing things that you enjoy, such as: Hobbies. Being outdoors. Spending time with friends and people who make you laugh. Doing yoga, muscle relaxation, deep breathing, or mindfulness practices. Expressing yourself through journal writing, art, crafting, poetry, or playing music. Staying positive about your health. Try to accept that you cannot control your condition perfectly. Follow these instructions at home: Medicines Take over-the-counter and prescription medicines only as told by your health care provider. If you were prescribed antibiotics, take them as told by your health care provider. Do not stop taking them even if you start to feel better. If you develop a fever, do not take medicines to reduce the fever right away. This could cover up another problem. Contact your health care provider. Eating and drinking Drink enough fluid to keep your urine pale yellow. Drink more in hot weather and during exercise. Limit or avoid drinking alcohol. Eat a balanced and nutritious diet. Eat plenty of fruits, vegetables, whole grains, and lean protein. Take vitamins and supplements as told by your health care provider. Traveling When traveling, keep these with you: Your medical information. The names of your health care providers. Your medicines. If you have to travel by air, ask about precautions you should take. Managing pain Work with  your health care provider to create a pain management plan that works for you. The plan may include: Ways to reduce or manage your pain at home, such as: Using a heating pad. Taking a warm bath. Using healthy ways to distract you from the pain, such as hobbies or  reading. Practicing ways to relax, such as doing yoga or listening to music. Getting massages. Doing exercises or stretches as told by a physical therapist. Tracking how pain affects your daily life functions. When to seek help. Who to contact and what to do in case of a pain emergency. General instructions Do not use any products that contain nicotine or tobacco. These products include cigarettes, chewing tobacco, and vaping devices, such as e-cigarettes. These lower blood oxygen levels. If you need help quitting, ask your health care provider. Consider wearing a medical alert bracelet. Use an app or journal to track your symptoms, assess your level of pain and fatigue, and keep track of your medicines. Avoid the following: High altitudes. Very high or low temperatures and big changes in temperature. Activities that will lower your oxygen levels, such as mountain climbing or doing exercise that takes a lot of effort. Stay up to date on: Your treatment plan. Learn as much as you can about your condition. Health screenings. This will help prevent problems or catch them early on. Vaccines. This will help prevent infection. Wash your hands often with soap and water to help prevent infections. Wash them for at least 20 seconds each time. Keep all follow-up visits. Regular follow-up with your health care provider can help you better manage your condition. Where to find support You can find help and support through: Talking with a therapist or taking part in support groups. Sickle Cell Disease Foundation of [place]: www.sicklecelldisease.org Where to find more information Centers for Disease Control and Prevention: FootballExhibition.com.br American Society of Hematology: www.hematology.org Contact a health care provider if: Your symptoms get worse. You have new symptoms. You have a fever. Get help right away if: You have a painful erection of the penis that lasts a long time (priapism). You become  short of breath or are having trouble breathing. You have pain that cannot be controlled with medicine. You have any signs of a stroke. "BE FAST" is an easy way to remember the main warning signs: B - Balance. Dizziness, sudden trouble walking, or loss of balance. E - Eyes. Trouble seeing or a change in how you see. F - Face. Sudden weakness or loss of feeling of the face. The face or eyelid may droop on one side. A - Arms. Weakness or loss of feeling in an arm. This happens all of a sudden and most often on one side of the body. S - Speech. Sudden trouble speaking, slurred speech, or trouble understanding what people say. T - Time. Time to call emergency services. Write down what time symptoms started. You have other signs of a stroke, such as: A sudden, very bad headache with no known cause. Feeling like you may vomit (nausea). Vomiting. Seizure. These symptoms may be an emergency. Get help right away. Call 911. Do not wait to see if the symptoms will go away. Do not drive yourself to the hospital. Also, get help right away if: You have strong feelings of sadness or loss of hope, or you have thoughts about hurting yourself or others. Take one of these steps if you feel like you may hurt yourself or others, or have thoughts about taking your own life:  Go to your nearest emergency room. Call 911. Call the National Suicide Prevention Lifeline at 478-004-5819 or 988. This is open 24 hours a day. Text the Crisis Text Line at 214-508-7544. Summary Proper care and treatment can help manage the effects of sickle cell disease so you can feel good and lead an active life. The goals of treatment are to control your symptoms and prevent and treat problems. Taking an active role in managing your condition can help you feel more in control of your situation. Work with your health care provider to create a pain management plan that works for you. Get medical help right away as told by your health care  provider. This information is not intended to replace advice given to you by your health care provider. Make sure you discuss any questions you have with your health care provider. Document Revised: 05/10/2021 Document Reviewed: 05/10/2021 Elsevier Patient Education  2024 [employer].

## 2022-11-02 ENCOUNTER — Encounter (HOSPITAL_COMMUNITY): Payer: Self-pay

## 2022-11-02 ENCOUNTER — Emergency Department (HOSPITAL_COMMUNITY)
Admission: EM | Admit: 2022-11-02 | Discharge: 2022-11-03 | Discharge status: Against Medical Advice | Payer: 59 | Attending: Emergency Medicine | Admitting: Emergency Medicine

## 2022-11-02 ENCOUNTER — Other Ambulatory Visit: Payer: Self-pay

## 2022-11-02 DIAGNOSIS — Z5329 Procedure and treatment not carried out because of patient's decision for other reasons: Secondary | ICD-10-CM | POA: Diagnosis not present

## 2022-11-02 DIAGNOSIS — D5700 Hb-SS disease with crisis, unspecified: Secondary | ICD-10-CM

## 2022-11-02 DIAGNOSIS — Z7901 Long term (current) use of anticoagulants: Secondary | ICD-10-CM | POA: Diagnosis not present

## 2022-11-02 DIAGNOSIS — D57219 Sickle-cell/Hb-C disease with crisis, unspecified: Secondary | ICD-10-CM | POA: Insufficient documentation

## 2022-11-02 MED ORDER — HYDROMORPHONE HCL 2 MG/ML IJ SOLN
2.0000 mg | INTRAMUSCULAR | Status: AC
  Administered 2022-11-03: 2 mg via INTRAVENOUS
  Filled 2022-11-02: qty 1

## 2022-11-02 MED ORDER — DEXTROSE-SODIUM CHLORIDE 5-0.45 % IV SOLN: INTRAVENOUS | Status: DC

## 2022-11-02 NOTE — ED Provider Triage Note (Cosign Needed)
Emergency Medicine Provider Triage Evaluation Note  Corydon Kissner , a 32 y.o. male  was evaluated in triage.  Pt complains of sickle cell pain in his lower back.  States his pain has not been controlled with his home medications.  Denies any shortness of breath or fevers.  Wants blood work to be performed in the back as he states he is a hard stick.  Review of Systems  Positive: As above Negative: As above  Physical Exam  BP 127/84 (BP Location: Left Arm)   Pulse 67   Temp 98.8 F (37.1 C) (Oral)   Resp (!) 22   Ht 6\' 2"  (1.88 m)   Wt 56.7 kg   SpO2 97%   BMI 16.05 kg/m  Gen:   Awake, no distress   Resp:  Normal effort  MSK:   Moves extremities without difficulty    Medical Decision Making  Medically screening exam initiated at 7:59 PM.  Appropriate orders placed.  Mujtaba Dyck was informed that the remainder of the evaluation will be completed by another provider, this initial triage assessment does not replace that evaluation, and the importance of remaining in the ED until their evaluation is complete.     Arabella Merles, PA-C 11/02/22 2000

## 2022-11-02 NOTE — ED Triage Notes (Signed)
Pt reports sickle cell pain crisis. Reports 10/10 lower back pain with no relief from PO medications last taken approx 3 pm. No chest pain. No SOB.

## 2022-11-02 NOTE — ED Notes (Signed)
Pt declines lab draw in triage.

## 2022-11-03 DIAGNOSIS — D57219 Sickle-cell/Hb-C disease with crisis, unspecified: Secondary | ICD-10-CM | POA: Diagnosis not present

## 2022-11-03 LAB — CBC WITH DIFFERENTIAL/PLATELET
Abs Immature Granulocytes: 0.02 10*3/uL (ref 0.00–0.07)
Basophils Absolute: 0 10*3/uL (ref 0.0–0.1)
Basophils Relative: 0 %
Eosinophils Absolute: 0.3 10*3/uL (ref 0.0–0.5)
Eosinophils Relative: 3 %
HCT: 34.5 % — ABNORMAL LOW (ref 39.0–52.0)
Hemoglobin: 11.9 g/dL — ABNORMAL LOW (ref 13.0–17.0)
Immature Granulocytes: 0 %
Lymphocytes Relative: 38 %
Lymphs Abs: 3.7 10*3/uL (ref 0.7–4.0)
MCH: 29.3 pg (ref 26.0–34.0)
MCHC: 34.5 g/dL (ref 30.0–36.0)
MCV: 85 fL (ref 80.0–100.0)
Monocytes Absolute: 0.9 10*3/uL (ref 0.1–1.0)
Monocytes Relative: 10 %
Neutro Abs: 4.7 10*3/uL (ref 1.7–7.7)
Neutrophils Relative %: 49 %
Platelets: 440 10*3/uL — ABNORMAL HIGH (ref 150–400)
RBC: 4.06 MIL/uL — ABNORMAL LOW (ref 4.22–5.81)
RDW: 19.9 % — ABNORMAL HIGH (ref 11.5–15.5)
WBC: 9.7 10*3/uL (ref 4.0–10.5)
nRBC: 0.7 % — ABNORMAL HIGH (ref 0.0–0.2)

## 2022-11-03 LAB — COMPREHENSIVE METABOLIC PANEL
ALT: 11 U/L (ref 0–44)
AST: 29 U/L (ref 15–41)
Albumin: 4.2 g/dL (ref 3.5–5.0)
Alkaline Phosphatase: 68 U/L (ref 38–126)
Anion gap: 10 (ref 5–15)
BUN: 6 mg/dL (ref 6–20)
CO2: 24 mmol/L (ref 22–32)
Calcium: 9.1 mg/dL (ref 8.9–10.3)
Chloride: 101 mmol/L (ref 98–111)
Creatinine, Ser: 0.45 mg/dL — ABNORMAL LOW (ref 0.61–1.24)
GFR, Estimated: >60 mL/min (ref >60)
Glucose, Bld: 111 mg/dL — ABNORMAL HIGH (ref 70–99)
Potassium: 3.8 mmol/L (ref 3.5–5.1)
Sodium: 135 mmol/L (ref 135–145)
Total Bilirubin: 2.9 mg/dL — ABNORMAL HIGH (ref 0.3–1.2)
Total Protein: 7.6 g/dL (ref 6.5–8.1)

## 2022-11-03 LAB — RETICULOCYTES
Immature Retic Fract: 27.6 % — ABNORMAL HIGH (ref 2.3–15.9)
RBC.: 4.07 MIL/uL — ABNORMAL LOW (ref 4.22–5.81)
Retic Count, Absolute: 310.9 K/uL — ABNORMAL HIGH (ref 19.0–186.0)
Retic Ct Pct: 7.6 % — ABNORMAL HIGH (ref 0.4–3.1)

## 2022-11-03 NOTE — ED Notes (Signed)
Pt requested to have his IV removed stating that "its got a bubble, please just take it out." Pt also informed that if he is still insisting to leave at this time that he will be signing out against medical advice as his lab results are still pending and the provider has not put him up for discharge. Pt states he would like to continue and go ahead and sign himself out and leave now, pt refusing to wait to talk to the ER provider prior to departure. Provider updated and made aware.

## 2022-11-03 NOTE — ED Provider Notes (Signed)
[facility] Provider Note   CSN: [id number] Arrival date & time: 11/02/22  1850     History  Chief Complaint  Patient presents with   Sickle Cell Pain Crisis    Timothy Marks is a 32 y.o. male.  Patient with history of sickle cell disease, osteonecrosis of the left shoulder, DVT on Xarelto, priapism, drug seeking behavior presents today with complaints of sickle cell pain crisis. He states that his pain is in his lower back and began today. Pain is consistent with his typical pain crises and states this feels similar to previous. He took his home narcotic pain medications for same without improvement. Denies fevers, chills, saddle anesthesia, bowel/bladder dysfunction, weakness/sensory deficits in lower extremities, prolonged corticosteroid use, IV drug use.  Denies any abdominal pain or urinary symptoms.    The history is provided by the patient. No language interpreter was used.  Sickle Cell Pain Crisis      Home Medications Prior to Admission medications   Medication Sig Start Date End Date Taking? Authorizing Provider  apixaban (ELIQUIS) 5 MG TABS tablet Take 10 mg by mouth 2 (two) times daily.   Yes [provider]  cholecalciferol (VITAMIN D) 25 MCG (1000 UNIT) tablet Take 1,000 Units by mouth daily.   Yes [provider]  diphenhydrAMINE (BENADRYL) 25 mg capsule Take 1 capsule (25 mg total) by mouth every 6 (six) hours as needed for itching. 08/11/22  Yes Massie Maroon, FNP  folic acid (FOLVITE) 1 MG tablet Take 1 tablet (1 mg total) by mouth daily. 07/05/21  Yes Massie Maroon, FNP  gabapentin (NEURONTIN) 300 MG capsule TAKE 1 CAPSULE(300 MG) BY MOUTH THREE TIMES DAILY Patient taking differently: Take 300 mg by mouth 3 (three) times daily. 08/24/21  Yes Quentin Angst, MD  hydrocortisone 1 % ointment Apply 1 Application topically 2 (two) times daily. Patient taking differently: Apply 1 Application  topically 2 (two) times daily as needed for itching. 10/14/21  Yes Quentin Angst, MD  hydroxyurea (HYDREA) 500 MG capsule Take 1 capsule (500 mg total) by mouth 2 (two) times daily. May take with food to minimize GI side effects. Patient taking differently: Take 500 mg by mouth 2 (two) times daily. 12/21/21  Yes Massie Maroon, FNP  hydrOXYzine (ATARAX) 25 MG tablet Take 25 mg by mouth daily as needed for anxiety or vomiting. 05/19/21  Yes [provider]  ibuprofen (ADVIL) 800 MG tablet Take 800 mg by mouth every 8 (eight) hours as needed.   Yes [provider]  mirtazapine (REMERON) 7.5 MG tablet Take 7.5 mg by mouth at bedtime as needed (for sleep). 05/19/21  Yes [provider]  naloxone (NARCAN) nasal spray 4 mg/0.1 mL Place 1 spray into the nose once. 09/04/22  Yes [provider]  OLANZapine (ZYPREXA) 5 MG tablet Take 1 tablet (5 mg total) by mouth at bedtime. 05/07/21  Yes Massie Maroon, FNP  oxyCODONE-acetaminophen (PERCOCET) 10-325 MG tablet Take 1 tablet by mouth every 6 (six) hours as needed for up to 15 days for pain. 10/25/22 11/09/22 Yes Massie Maroon, FNP  morphine (MS CONTIN) 15 MG 12 hr tablet Take 1 tablet (15 mg total) by mouth every 12 (twelve) hours. 11/01/22   Massie Maroon, FNP      Allergies    Buprenorphine hcl, Levaquin [levofloxacin], Meperidine, Morphine, Morphine and codeine, Tramadol, Vancomycin, and Zosyn [piperacillin sod-tazobactam so]    Review of Systems  Review of Systems  Musculoskeletal:  Positive for back pain.  All other systems reviewed and are negative.   Physical Exam Updated Vital Signs BP 114/74 (BP Location: Left Arm)   Pulse 69   Temp 98.7 F (37.1 C) (Oral)   Resp 14   Ht 6\' 2"  (1.88 m)   Wt 56.7 kg   SpO2 98%   BMI 16.05 kg/m  Physical Exam Vitals and nursing note reviewed.  Constitutional:      General: He is not in acute distress.    Appearance: Normal appearance. He is normal  weight. He is not ill-appearing, toxic-appearing or diaphoretic.  HENT:     Head: Normocephalic and atraumatic.  Cardiovascular:     Rate and Rhythm: Normal rate.  Pulmonary:     Effort: Pulmonary effort is normal. No respiratory distress.  Musculoskeletal:        General: Normal range of motion.     Cervical back: Normal range of motion.     Comments: No midline tenderness of cervical, thoracic, or lumbar spine spine.  Paraspinal tenderness noted to the bilateral lumbar region.  Muscular strength 5/5 for lower extremities. Patient observed to be ambulatory with steady gait.  Skin:    General: Skin is warm and dry.  Neurological:     General: No focal deficit present.     Mental Status: He is alert.  Psychiatric:        Mood and Affect: Mood normal.        Behavior: Behavior normal.     ED Results / Procedures / Treatments   Labs (all labs ordered are listed, but only abnormal results are displayed) Labs Reviewed  COMPREHENSIVE METABOLIC PANEL - Abnormal; Notable for the following components:      Result Value   Glucose, Bld 111 (*)    Creatinine, Ser 0.45 (*)    Total Bilirubin 2.9 (*)    All other components within normal limits  CBC WITH DIFFERENTIAL/PLATELET - Abnormal; Notable for the following components:   Platelets 440 (*)    All other components within normal limits  CBC WITH DIFFERENTIAL/PLATELET  RETICULOCYTES    EKG None  Radiology No results found.  Procedures Procedures    Medications Ordered in ED Medications  dextrose 5 % and 0.45 % NaCl infusion (0 mLs Intravenous Stopped 11/03/22 0238)  HYDROmorphone (DILAUDID) injection 2 mg (2 mg Intravenous Given 11/03/22 0109)  HYDROmorphone (DILAUDID) injection 2 mg (2 mg Intravenous Given 11/03/22 0137)  HYDROmorphone (DILAUDID) injection 2 mg (2 mg Intravenous Given 11/03/22 0222)    ED Course/ Medical Decision Making/ A&P                                 Medical Decision Making Risk Prescription drug  management.   This patient is a 32 y.o. male who presents to the ED for concern of sickle cell pain crisis, this involves an extensive number of treatment options, and is a complaint that carries with it a high risk of complications and morbidity. The emergent differential diagnosis prior to evaluation includes, but is not limited to,  sickle cell pain, AAA, aortic dissection, cauda equina, fracture, aplastic crisis. This is not an exhaustive differential.   Past Medical History / Co-morbidities / Social History:  has a past medical history of Depression, Generalized weakness (03/31/2019), Osteonecrosis in diseases classified elsewhere, left shoulder (HCC) (y-3), Sickle cell anemia (HCC), and Vitamin D deficiency (y-6).  Additional history: Chart reviewed. Pertinent results include: patient seen recently with similar complaints which improved with medication  Physical Exam: Physical exam performed. The pertinent findings include: see above, no midline tenderness or neurologic deficits  Lab Tests: I ordered, and personally interpreted labs.  The pertinent results include: CBC pending, T. bili 2.9, chart reviewed and has been as elevated previously.  Reticulocyte count pending.   Medications: I ordered medication including dilaudid, fluids  for pain. Reevaluation of the patient after these medicines showed that the patient improved. I have reviewed the patients home medicines and have made adjustments as needed.  Disposition:  Patient presents with symptoms consistent with sickle cell pain crisis.  Patient given IV pain medication for same.  Labs are pending and will determine dispo.  2:45AM: Nursing staff informed me that the patient had told them he was going to leave after the last dose of Dilaudid.  He refused to stay long enough to talk to me and signed AMA paperwork.  Nursing staff did discuss that his labs are pending and that he would have to sign out AGAINST MEDICAL ADVICE and  attempted to convince him to stay until labs had resulted, however patient refused.  Patient was able to ambulate out of the department without any assistance and remained alert and oriented and able to make his own medical decisions and  understand the consequences of that decision throughout his visit this evening. The patient was notified that they may return to the emergency department at any time for further treatment.    We discussed the nature and purpose, risks and benefits, as well as, the alternatives of treatment. Time was given to allow the opportunity to ask questions and consider their options, and after the discussion, the patient decided to refuse the offerred treatment. The patient was informed that refusal could lead to, but was not limited to, death, permanent disability, or severe pain. If present, I asked the relatives or significant others to dissuade them without success. Prior to refusing, I determined that the patient had the capacity to make their decision and. After refusal, I made every reasonable opportunity to treat them to the best of my ability.    Final Clinical Impression(s) / ED Diagnoses Final diagnoses:  Sickle cell pain crisis [facility])    Rx / DC Orders ED Discharge Orders     None         Vear Clock 11/03/22 0255    Palumbo, April, MD 11/03/22 0301

## 2022-11-03 NOTE — ED Notes (Signed)
Recollected lavender tube and sent to lab. This Clinical research associate called lab to inform them of specimen being sent so that it is not left in tube station.

## 2022-11-03 NOTE — ED Notes (Signed)
Pt pain level reassessed prior to administration of final dose of dilaudid ordered and after providing his response pt states to this nurse "yeah, I think this last dose gonna knock the pain out good, so after you give me this dose can you go ahead and give me my discharge papers?" Pt informed he is not up for discharge and due to x2 blood specimens sent to lab it was reported that both hemolyzed, so pt informed additional straight stick needed to obtain specimen. Provider updated that pt requesting to be discharged at this time.

## 2022-11-06 ENCOUNTER — Emergency Department (HOSPITAL_COMMUNITY)
Admission: EM | Admit: 2022-11-06 | Discharge: 2022-11-06 | Disposition: A | Discharge status: Home / Self Care | Payer: 59 | Attending: Emergency Medicine | Admitting: Emergency Medicine

## 2022-11-06 ENCOUNTER — Other Ambulatory Visit: Payer: Self-pay

## 2022-11-06 DIAGNOSIS — D5700 Hb-SS disease with crisis, unspecified: Secondary | ICD-10-CM | POA: Diagnosis not present

## 2022-11-06 DIAGNOSIS — Z7901 Long term (current) use of anticoagulants: Secondary | ICD-10-CM | POA: Diagnosis not present

## 2022-11-06 LAB — CBC WITH DIFFERENTIAL/PLATELET
Abs Immature Granulocytes: 0.05 10*3/uL (ref 0.00–0.07)
Basophils Absolute: 0 10*3/uL (ref 0.0–0.1)
Basophils Relative: 0 %
Eosinophils Absolute: 0.1 10*3/uL (ref 0.0–0.5)
Eosinophils Relative: 1 %
HCT: 33.1 % — ABNORMAL LOW (ref 39.0–52.0)
Hemoglobin: 11.5 g/dL — ABNORMAL LOW (ref 13.0–17.0)
Immature Granulocytes: 0 %
Lymphocytes Relative: 18 %
Lymphs Abs: 2.3 10*3/uL (ref 0.7–4.0)
MCH: 29.1 pg (ref 26.0–34.0)
MCHC: 34.7 g/dL (ref 30.0–36.0)
MCV: 83.8 fL (ref 80.0–100.0)
Monocytes Absolute: 0.8 10*3/uL (ref 0.1–1.0)
Monocytes Relative: 7 %
Neutro Abs: 8.9 10*3/uL — ABNORMAL HIGH (ref 1.7–7.7)
Neutrophils Relative %: 74 %
Platelets: 442 10*3/uL — ABNORMAL HIGH (ref 150–400)
RBC: 3.95 MIL/uL — ABNORMAL LOW (ref 4.22–5.81)
RDW: 20.1 % — ABNORMAL HIGH (ref 11.5–15.5)
WBC: 12.2 10*3/uL — ABNORMAL HIGH (ref 4.0–10.5)
nRBC: 1.1 % — ABNORMAL HIGH (ref 0.0–0.2)

## 2022-11-06 LAB — RETICULOCYTES
Immature Retic Fract: 37.4 % — ABNORMAL HIGH (ref 2.3–15.9)
RBC.: 3.94 MIL/uL — ABNORMAL LOW (ref 4.22–5.81)
Retic Count, Absolute: 321 10*3/uL — ABNORMAL HIGH (ref 19.0–186.0)
Retic Ct Pct: 7.8 % — ABNORMAL HIGH (ref 0.4–3.1)

## 2022-11-06 LAB — BASIC METABOLIC PANEL
Anion gap: 9 (ref 5–15)
BUN: 8 mg/dL (ref 6–20)
CO2: 24 mmol/L (ref 22–32)
Calcium: 9.3 mg/dL (ref 8.9–10.3)
Chloride: 104 mmol/L (ref 98–111)
Creatinine, Ser: 0.56 mg/dL — ABNORMAL LOW (ref 0.61–1.24)
GFR, Estimated: >60 mL/min (ref >60)
Glucose, Bld: 92 mg/dL (ref 70–99)
Potassium: 3.9 mmol/L (ref 3.5–5.1)
Sodium: 137 mmol/L (ref 135–145)

## 2022-11-06 MED ORDER — ONDANSETRON HCL 4 MG/2ML IJ SOLN
4.0000 mg | Freq: Once | INTRAMUSCULAR | Status: AC
  Administered 2022-11-06: 4 mg via INTRAVENOUS
  Filled 2022-11-06: qty 2

## 2022-11-06 MED ORDER — HYDROMORPHONE HCL 1 MG/ML IJ SOLN
1.0000 mg | INTRAMUSCULAR | Status: AC
  Administered 2022-11-06: 1 mg via INTRAVENOUS
  Filled 2022-11-06: qty 1

## 2022-11-06 MED ORDER — HYDROMORPHONE HCL 1 MG/ML IJ SOLN
0.5000 mg | INTRAMUSCULAR | Status: AC
  Administered 2022-11-06: 0.5 mg via INTRAVENOUS
  Filled 2022-11-06: qty 1

## 2022-11-06 NOTE — ED Provider Notes (Signed)
I provided a substantive portion of the care of this patient.  I personally made/approved the management plan for this patient and take responsibility for the patient management.    Patient here complaining of sickle cell crisis.  Labs here are reassuring.  Hemoglobin 11.5.  Given 3 doses of pain medication here.  Will have patient follow-up in the clinic tomorrow      Lorre Nick, MD 11/06/22 1406

## 2022-11-06 NOTE — Discharge Instructions (Signed)
Follow-up at the patient care center tomorrow

## 2022-11-06 NOTE — ED Provider Notes (Signed)
[place] EMERGENCY DEPARTMENT AT [facility] Provider Note   CSN: [id number] Arrival date & time: 11/06/22  1030     History  Chief Complaint  Patient presents with   Sickle Cell Pain Crisis    Timothy Marks is a 32 y.o. male senting to the emergency room with sickle cell pain patient locates his pain at the right lower back.  States that pain started this morning around 5 AM he took morphine and it brought his pain down a little bit.  He feels that it is the same as prior sickle cell pain crisis.  He has no abdominal pain nausea vomiting diarrhea chest pain or shortness of breath.  No recent illness.  No recent trauma injury or fall.   Sickle Cell Pain Crisis      Home Medications Prior to Admission medications   Medication Sig Start Date End Date Taking? Authorizing Provider  apixaban (ELIQUIS) 5 MG TABS tablet Take 10 mg by mouth 2 (two) times daily.    [provider]  cholecalciferol (VITAMIN D) 25 MCG (1000 UNIT) tablet Take 1,000 Units by mouth daily.    [provider]  diphenhydrAMINE (BENADRYL) 25 mg capsule Take 1 capsule (25 mg total) by mouth every 6 (six) hours as needed for itching. 08/11/22   Massie Maroon, FNP  folic acid (FOLVITE) 1 MG tablet Take 1 tablet (1 mg total) by mouth daily. 07/05/21   Massie Maroon, FNP  gabapentin (NEURONTIN) 300 MG capsule TAKE 1 CAPSULE(300 MG) BY MOUTH THREE TIMES DAILY Patient taking differently: Take 300 mg by mouth 3 (three) times daily. 08/24/21   Quentin Angst, MD  hydrocortisone 1 % ointment Apply 1 Application topically 2 (two) times daily. Patient taking differently: Apply 1 Application topically 2 (two) times daily as needed for itching. 10/14/21   Quentin Angst, MD  hydroxyurea (HYDREA) 500 MG capsule Take 1 capsule (500 mg total) by mouth 2 (two) times daily. May take with food to minimize GI side effects. Patient taking differently: Take 500 mg by mouth 2 (two) times  daily. 12/21/21   Massie Maroon, FNP  hydrOXYzine (ATARAX) 25 MG tablet Take 25 mg by mouth daily as needed for anxiety or vomiting. 05/19/21   [provider]  ibuprofen (ADVIL) 800 MG tablet Take 800 mg by mouth every 8 (eight) hours as needed.    [provider]  mirtazapine (REMERON) 7.5 MG tablet Take 7.5 mg by mouth at bedtime as needed (for sleep). 05/19/21   [provider]  morphine (MS CONTIN) 15 MG 12 hr tablet Take 1 tablet (15 mg total) by mouth every 12 (twelve) hours. 11/01/22   Massie Maroon, FNP  naloxone [facility]) nasal spray 4 mg/0.1 mL Place 1 spray into the nose once. 09/04/22   [provider]  OLANZapine (ZYPREXA) 5 MG tablet Take 1 tablet (5 mg total) by mouth at bedtime. 05/07/21   Massie Maroon, FNP  oxyCODONE-acetaminophen (PERCOCET) 10-325 MG tablet Take 1 tablet by mouth every 6 (six) hours as needed for up to 15 days for pain. 10/25/22 11/09/22  Massie Maroon, FNP      Allergies    Buprenorphine hcl, Levaquin [levofloxacin], Meperidine, Morphine, Morphine and codeine, Tramadol, Vancomycin, and Zosyn [piperacillin sod-tazobactam so]    Review of Systems   Review of Systems  Physical Exam Updated Vital Signs BP 127/76 (BP Location: Left Arm)   Pulse 91   Temp 98.8 F (37.1  C) (Oral)   Resp 16   Ht 6\' 2"  (1.88 m)   Wt 56.7 kg   SpO2 97%   BMI 16.05 kg/m  Physical Exam  ED Results / Procedures / Treatments   Labs (all labs ordered are listed, but only abnormal results are displayed) Labs Reviewed - No data to display  EKG None  Radiology No results found.  Procedures Procedures    Medications Ordered in ED Medications - No data to display  ED Course/ Medical Decision Making/ A&P                                 Medical Decision Making Amount and/or Complexity of Data Reviewed Labs: ordered.  Risk Prescription drug management.   Timothy Marks 32 y.o. presented today for back pain and sickle  cell pain crisis. Working Ddx: MSK in nature, fracture, epidural hematoma/abscess, cauda equina syndrome, spinal stenosis, spinal malignancy, discitis, spinal infection, spondylitises/ spondylosis, conus medullaris, DDD of the back.  R/o DDx: Cauda equina syndrome and additional dx are less likely than current impression due to history of present illness, physical exam, labs/imaging findings. No focal neurological deficits, no loss of bowel or bladder control.  Denies fever, night sweats, weight loss, h/o cancer, IVDU.  PMHX: SCD, osteonecrosis     Review of prior external notes: recent DB visit   Labs:  Reticulocytes prior labs consistent with today's findings. BMP within normal limits CBC wbc 12.2, hgb 11.5, plt 442 all lab work is consistent with patient's prior recent labs   Imaging: none  Problem List / ED Course / Critical interventions / Medication management  Reporting to the emergency room with sickle cell pain crisis.  Findings distant with patient prior labs, reticulocyte response and stable hemoglobin -was given IV pain medications and Zofran given.  The pain medications improved discomfort.  Has had multiple visits recently for sickle cell pain crisis.  Patient agrees to follow-up at sickle cell patient care center tomorrow to follow-up on symptoms.  Patient agrees to plan and will follow-up.  Dr. Freida Busman also saw this patient and agreed that he is hemodynamically stable and discharge was appropriate for this patient with good follow-up. I ordered medication including IV pain medication  for pain  Reevaluation of the patient after these medicines showed that the patient improved Patients vitals assessed. Upon arrival patient is hemodynamically stable.  I have reviewed the patients home medicines and have made adjustments as needed  Consult: None   Plan:  Patient follow-up with sickle cell care team tomorrow. RICE protocol and pain medicine discussed with patient for back  pain.  Patient was given return precautions. Patient stable for discharge at this time.  Patient educated on sx/dx and verbalized understanding of plan. Return to ER with new or worsening sx.          Final Clinical Impression(s) / ED Diagnoses Final diagnoses:  None    Rx / DC Orders ED Discharge Orders     None         Reinaldo Raddle 11/06/22 1540    Lorre Nick, MD 11/07/22 [phone]

## 2022-11-06 NOTE — ED Triage Notes (Signed)
Pt reports sickle cell pain crisis. Reports 9/10 lower back pain since 0500. Morphine at 0500

## 2022-11-07 LAB — DRUG SCREEN 764883 11+OXYCO+ALC+CRT-BUND
Amphetamines, Urine: NEGATIVE ng/mL
BENZODIAZ UR QL: NEGATIVE ng/mL
Barbiturate: NEGATIVE ng/mL
Cocaine (Metabolite): NEGATIVE ng/mL
Creatinine: 136.1 mg/dL (ref 20.0–300.0)
Ethanol: NEGATIVE %
Meperidine: NEGATIVE ng/mL
Methadone Screen, Urine: NEGATIVE ng/mL
OPIATE SCREEN URINE: NEGATIVE ng/mL
Phencyclidine: NEGATIVE ng/mL
Propoxyphene: NEGATIVE ng/mL
Tramadol: NEGATIVE ng/mL
pH, Urine: 6.6 (ref 4.5–8.9)

## 2022-11-07 LAB — OXYCODONE/OXYMORPHONE, CONFIRM: OXYCODONE/OXYMORPH: NEGATIVE

## 2022-11-07 LAB — CANNABINOID CONFIRMATION, UR
CANNABINOIDS: POSITIVE — AB
Carboxy THC GC/MS Conf: >750 ng/mL

## 2022-11-08 ENCOUNTER — Other Ambulatory Visit: Payer: Self-pay | Admitting: Family Medicine

## 2022-11-08 DIAGNOSIS — G894 Chronic pain syndrome: Secondary | ICD-10-CM

## 2022-11-08 MED ORDER — OXYCODONE-ACETAMINOPHEN 10-325 MG PO TABS: 1.0000 | ORAL_TABLET | Freq: Four times a day (QID) | ORAL | 0 refills | Status: DC | PRN

## 2022-11-08 NOTE — Telephone Encounter (Signed)
Caller & Relationship to patient:  MRN #  [id number]   Call Back Number:   Date of Last Office Visit: 10/10/2022     Date of Next Office Visit: 10/24/2022    Medication(s) to be Refilled: Percocet  Preferred Pharmacy:   ** Please notify patient to allow 48-72 hours to process** **Let patient know to contact pharmacy at the end of the day to make sure medication is ready. ** **If patient has not been seen in a year or longer, book an appointment **Advise to use MyChart for refill requests OR to contact their pharmacy

## 2022-11-08 NOTE — Telephone Encounter (Signed)
Reviewed PDMP substance reporting system prior to prescribing opiate medications. No inconsistencies noted.   Meds ordered this encounter  Medications  . oxyCODONE-acetaminophen (PERCOCET) 10-325 MG tablet    Sig: Take 1 tablet by mouth every 6 (six) hours as needed for up to 15 days for pain.    Dispense:  60 tablet    Refill:  0    Order Specific Question:   Supervising Provider    Answer:   Quentin Angst [[id number]]    [name]  APRN, MSN, FNP-C Patient Care [facility] [address] [place], [place] [address] [phone]

## 2022-11-15 ENCOUNTER — Other Ambulatory Visit: Payer: Self-pay | Admitting: Family Medicine

## 2022-11-15 DIAGNOSIS — D571 Sickle-cell disease without crisis: Secondary | ICD-10-CM

## 2022-11-15 DIAGNOSIS — G894 Chronic pain syndrome: Secondary | ICD-10-CM

## 2022-11-21 ENCOUNTER — Telehealth: Payer: Self-pay | Admitting: Internal Medicine

## 2022-11-21 ENCOUNTER — Non-Acute Institutional Stay (HOSPITAL_COMMUNITY)
Admission: AD | Admit: 2022-11-21 | Discharge: 2022-11-21 | Disposition: A | Discharge status: Home / Self Care | Payer: 59 | Source: Home / Self Care | Attending: Internal Medicine | Admitting: Internal Medicine

## 2022-11-21 ENCOUNTER — Emergency Department (HOSPITAL_COMMUNITY)
Admission: EM | Admit: 2022-11-21 | Discharge: 2022-11-21 | Disposition: A | Discharge status: Hospice | Payer: 59 | Attending: Internal Medicine | Admitting: Internal Medicine

## 2022-11-21 ENCOUNTER — Other Ambulatory Visit: Payer: Self-pay

## 2022-11-21 ENCOUNTER — Other Ambulatory Visit: Payer: Self-pay | Admitting: Family Medicine

## 2022-11-21 ENCOUNTER — Encounter (HOSPITAL_COMMUNITY): Payer: Self-pay | Admitting: *Deleted

## 2022-11-21 DIAGNOSIS — Z8616 Personal history of COVID-19: Secondary | ICD-10-CM | POA: Insufficient documentation

## 2022-11-21 DIAGNOSIS — D5700 Hb-SS disease with crisis, unspecified: Secondary | ICD-10-CM | POA: Insufficient documentation

## 2022-11-21 DIAGNOSIS — Z832 Family history of diseases of the blood and blood-forming organs and certain disorders involving the immune mechanism: Secondary | ICD-10-CM | POA: Insufficient documentation

## 2022-11-21 DIAGNOSIS — M545 Low back pain, unspecified: Secondary | ICD-10-CM | POA: Insufficient documentation

## 2022-11-21 DIAGNOSIS — Z7901 Long term (current) use of anticoagulants: Secondary | ICD-10-CM | POA: Insufficient documentation

## 2022-11-21 DIAGNOSIS — D571 Sickle-cell disease without crisis: Secondary | ICD-10-CM

## 2022-11-21 DIAGNOSIS — F1120 Opioid dependence, uncomplicated: Secondary | ICD-10-CM | POA: Insufficient documentation

## 2022-11-21 DIAGNOSIS — G894 Chronic pain syndrome: Secondary | ICD-10-CM

## 2022-11-21 DIAGNOSIS — Z87891 Personal history of nicotine dependence: Secondary | ICD-10-CM | POA: Insufficient documentation

## 2022-11-21 LAB — CBC WITH DIFFERENTIAL/PLATELET
Abs Immature Granulocytes: 0.03 10*3/uL (ref 0.00–0.07)
Basophils Absolute: 0 10*3/uL (ref 0.0–0.1)
Basophils Relative: 0 %
Eosinophils Absolute: 0.4 10*3/uL (ref 0.0–0.5)
Eosinophils Relative: 4 %
HCT: 30.5 % — ABNORMAL LOW (ref 39.0–52.0)
Hemoglobin: 11 g/dL — ABNORMAL LOW (ref 13.0–17.0)
Immature Granulocytes: 0 %
Lymphocytes Relative: 20 %
Lymphs Abs: 2 10*3/uL (ref 0.7–4.0)
MCH: 30.8 pg (ref 26.0–34.0)
MCHC: 36.1 g/dL — ABNORMAL HIGH (ref 30.0–36.0)
MCV: 85.4 fL (ref 80.0–100.0)
Monocytes Absolute: 1 10*3/uL (ref 0.1–1.0)
Monocytes Relative: 10 %
Neutro Abs: 6.7 10*3/uL (ref 1.7–7.7)
Neutrophils Relative %: 66 %
Platelets: 463 10*3/uL — ABNORMAL HIGH (ref 150–400)
RBC: 3.57 MIL/uL — ABNORMAL LOW (ref 4.22–5.81)
RDW: 19.5 % — ABNORMAL HIGH (ref 11.5–15.5)
WBC: 10.2 10*3/uL (ref 4.0–10.5)
nRBC: 1.2 % — ABNORMAL HIGH (ref 0.0–0.2)

## 2022-11-21 LAB — COMPREHENSIVE METABOLIC PANEL
ALT: 10 U/L (ref 0–44)
AST: 24 U/L (ref 15–41)
Albumin: 4.2 g/dL (ref 3.5–5.0)
Alkaline Phosphatase: 61 U/L (ref 38–126)
Anion gap: 11 (ref 5–15)
BUN: 8 mg/dL (ref 6–20)
CO2: 24 mmol/L (ref 22–32)
Calcium: 9 mg/dL (ref 8.9–10.3)
Chloride: 103 mmol/L (ref 98–111)
Creatinine, Ser: 0.6 mg/dL — ABNORMAL LOW (ref 0.61–1.24)
GFR, Estimated: >60 mL/min (ref >60)
Glucose, Bld: 108 mg/dL — ABNORMAL HIGH (ref 70–99)
Potassium: 3.4 mmol/L — ABNORMAL LOW (ref 3.5–5.1)
Sodium: 138 mmol/L (ref 135–145)
Total Bilirubin: 3.6 mg/dL — ABNORMAL HIGH (ref 0.3–1.2)
Total Protein: 7.6 g/dL (ref 6.5–8.1)

## 2022-11-21 LAB — RETICULOCYTES
Immature Retic Fract: 36.7 % — ABNORMAL HIGH (ref 2.3–15.9)
RBC.: 3.63 MIL/uL — ABNORMAL LOW (ref 4.22–5.81)
Retic Count, Absolute: 273.3 10*3/uL — ABNORMAL HIGH (ref 19.0–186.0)
Retic Ct Pct: 7.5 % — ABNORMAL HIGH (ref 0.4–3.1)

## 2022-11-21 MED ORDER — HYDROMORPHONE HCL 1 MG/ML IJ SOLN
1.0000 mg | INTRAMUSCULAR | Status: AC
  Administered 2022-11-21: 1 mg via INTRAVENOUS
  Filled 2022-11-21: qty 1

## 2022-11-21 MED ORDER — KETOROLAC TROMETHAMINE 30 MG/ML IJ SOLN
15.0000 mg | Freq: Once | INTRAMUSCULAR | Status: AC
  Administered 2022-11-21: 15 mg via INTRAVENOUS
  Filled 2022-11-21: qty 1

## 2022-11-21 MED ORDER — KETOROLAC TROMETHAMINE 15 MG/ML IJ SOLN
15.0000 mg | INTRAMUSCULAR | Status: AC
  Administered 2022-11-21: 15 mg via INTRAVENOUS
  Filled 2022-11-21: qty 1

## 2022-11-21 MED ORDER — NALOXONE HCL 0.4 MG/ML IJ SOLN: 0.4000 mg | INTRAMUSCULAR | Status: DC | PRN

## 2022-11-21 MED ORDER — DEXTROSE-SODIUM CHLORIDE 5-0.45 % IV SOLN: INTRAVENOUS | Status: DC

## 2022-11-21 MED ORDER — ONDANSETRON HCL 4 MG/2ML IJ SOLN
4.0000 mg | INTRAMUSCULAR | Status: DC | PRN
  Administered 2022-11-21: 4 mg via INTRAVENOUS
  Filled 2022-11-21: qty 2

## 2022-11-21 MED ORDER — HYDROMORPHONE HCL 1 MG/ML IJ SOLN
1.0000 mg | Freq: Once | INTRAMUSCULAR | Status: AC
  Administered 2022-11-21: 1 mg via INTRAVENOUS
  Filled 2022-11-21: qty 1

## 2022-11-21 MED ORDER — DIPHENHYDRAMINE HCL 25 MG PO CAPS
25.0000 mg | ORAL_CAPSULE | ORAL | Status: DC | PRN
  Administered 2022-11-21: 25 mg via ORAL
  Filled 2022-11-21: qty 1

## 2022-11-21 MED ORDER — HYDROMORPHONE 1 MG/ML IV SOLN
INTRAVENOUS | Status: DC
  Administered 2022-11-21: 30 mg via INTRAVENOUS
  Administered 2022-11-21: 9.5 mg via INTRAVENOUS
  Filled 2022-11-21: qty 30

## 2022-11-21 MED ORDER — HYDROMORPHONE HCL 1 MG/ML IJ SOLN
0.5000 mg | INTRAMUSCULAR | Status: AC
  Administered 2022-11-21: 0.5 mg via INTRAVENOUS
  Filled 2022-11-21: qty 1

## 2022-11-21 MED ORDER — OXYCODONE-ACETAMINOPHEN 10-325 MG PO TABS: 1.0000 | ORAL_TABLET | Freq: Four times a day (QID) | ORAL | 0 refills | Status: DC | PRN

## 2022-11-21 MED ORDER — ACETAMINOPHEN 500 MG PO TABS
1000.0000 mg | ORAL_TABLET | Freq: Once | ORAL | Status: AC
  Administered 2022-11-21: 1000 mg via ORAL
  Filled 2022-11-21: qty 2

## 2022-11-21 MED ORDER — SODIUM CHLORIDE 0.9% FLUSH: 9.0000 mL | INTRAVENOUS | Status: DC | PRN

## 2022-11-21 MED ORDER — ONDANSETRON HCL 4 MG/2ML IJ SOLN: 4.0000 mg | Freq: Four times a day (QID) | INTRAMUSCULAR | Status: DC | PRN

## 2022-11-21 MED ORDER — MORPHINE SULFATE ER 15 MG PO TBCR: 15.0000 mg | EXTENDED_RELEASE_TABLET | Freq: Two times a day (BID) | ORAL | 0 refills | Status: DC

## 2022-11-21 NOTE — Telephone Encounter (Signed)
Prescription Request  11/21/2022  LOV: Visit date not found  What is the name of the medication or equipment? Oxycodone, morphine - says it is suppose to be 60 tablets but last time it was only 30 tablets. Pt Requesting 60 be sent this time please.   Have you contacted your pharmacy to request a refill? Yes   Which pharmacy would you like this sent to?  [facility] [phone] - [place], [place] - [address] AT [facility] [address] [place] [address] Phone: [phone] Fax: [phone]    Patient notified that their request is being sent to the clinical staff for review and that they should receive a response within 2 business days.   Please advise at [facility] [phone]

## 2022-11-21 NOTE — ED Provider Notes (Signed)
Patient still having pain after 3 doses of Dilaudid.  I have given him a fourth dose and I spoke with [place] Hollis and we decided to send the patient over to the sickle cell clinic now for continued care   Bethann Berkshire, MD 11/21/22 [phone]

## 2022-11-21 NOTE — ED Triage Notes (Signed)
Pt c/o sickle cell back pain (lower back) that woke him up from his sleep several hours ago. He is out of his pain medication.

## 2022-11-21 NOTE — ED Provider Notes (Signed)
[place] EMERGENCY DEPARTMENT AT [facility] Provider Note  CSN: [id number] Arrival date & time: 11/21/22 0450  Chief Complaint(s) Sickle Cell Pain Crisis  HPI Timothy Marks is a 32 y.o. male with a past medical history listed below including sickle cell disease here for several hours of lower back pain.  Typical of his prior episodes.  States that he is compliant with his home medication but does not provide any relief.  Denies any nausea or vomiting.  No chest pain or shortness of breath.  No abdominal pain.  No urinary symptoms.  No other physical complaints  The history is provided by the patient.    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   Arm pain, diffuse, right 07/02/2021   Marijuana smoker 01/27/2021   Lab test positive for detection of COVID-19 virus 01/23/2020   Medication management 01/16/2020   Underweight 06/14/2019   Generalized weakness 03/31/2019   Sickle-cell crisis (HCC) 03/24/2019   SIRS (systemic inflammatory response syndrome) (HCC) 02/10/2019   Abnormal liver function 11/04/2018   Sickle cell anemia with crisis (HCC) 05/22/2018   Narcotic dependence (HCC) 02/10/2018   Acute bilateral low back pain without sciatica    Other chronic pain    Anemia    Socially inappropriate behavior 12/02/2017   Thrombocythemia    Physical deconditioning    Agitation    Chronic pain syndrome    Recurrent cold sores    Fever of unknown origin    Drug-seeking behavior 08/30/2017   Pain    Sickle cell crisis (HCC) 07/16/2017   Adjustment disorder with mixed disturbance of emotions and conduct    Bilateral pulmonary  infiltrates on chest x-ray 11/21/2016   History of DVT (deep vein thrombosis) 11/17/2016   Suicidal ideations 10/11/2016   Reticulocytosis 10/09/2016   DVT (deep venous thrombosis) (HCC) 08/02/2016   Has difficulty accessing primary care provider for most visits 07/11/2015   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   Hordeolum externum of left upper eyelid 05/31/2015   Major depressive disorder with current active episode 11/09/2014   Hb-SS disease with acute chest syndrome (HCC) 11/08/2014   Pneumonia of right upper lobe due to infectious organism 11/07/2014   Constipation 08/03/2014   Leukocytosis 06/30/2014   h/o Priapism 05/06/2014   Protein-calorie malnutrition, severe (HCC) 04/09/2014   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, left shoulder (HCC)    Vitamin D deficiency    Sickle cell anemia (HCC) 03/30/2014   Hyperbilirubinemia 03/30/2014   Hypokalemia 02/07/2014   Mandible fracture (HCC) 05/03/2013   Aggressive behavior 01/25/2013   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 Authorizing Provider  apixaban (ELIQUIS) 5 MG TABS tablet Take 10 mg by mouth 2 (two) times daily.   Yes [provider]  cholecalciferol (VITAMIN D) 25 MCG (1000 UNIT) tablet  Take 1,000 Units by mouth daily.   Yes [provider]  diphenhydrAMINE (BENADRYL) 25 mg capsule Take 1 capsule (25 mg total) by mouth every 6 (six) hours as needed for itching. 08/11/22  Yes Massie Maroon, FNP  folic acid (FOLVITE) 1 MG tablet Take 1 tablet (1 mg total) by mouth daily. 07/05/21  Yes Massie Maroon, FNP  gabapentin  (NEURONTIN) 300 MG capsule TAKE 1 CAPSULE(300 MG) BY MOUTH THREE TIMES DAILY Patient taking differently: Take 300 mg by mouth 3 (three) times daily. 08/24/21  Yes Quentin Angst, MD  hydrocortisone 1 % ointment Apply 1 Application topically 2 (two) times daily. Patient taking differently: Apply 1 Application topically 2 (two) times daily as needed for itching. 10/14/21  Yes Quentin Angst, MD  hydroxyurea (HYDREA) 500 MG capsule Take 1 capsule (500 mg total) by mouth 2 (two) times daily. May take with food to minimize GI side effects. Patient taking differently: Take 500 mg by mouth 2 (two) times daily. 12/21/21  Yes Massie Maroon, FNP  hydrOXYzine (ATARAX) 25 MG tablet Take 25 mg by mouth daily as needed for anxiety or vomiting. 05/19/21  Yes [provider]  ibuprofen (ADVIL) 800 MG tablet Take 800 mg by mouth every 8 (eight) hours as needed.   Yes [provider]  mirtazapine (REMERON) 7.5 MG tablet Take 7.5 mg by mouth at bedtime as needed (for sleep). 05/19/21  Yes [provider]  morphine (MS CONTIN) 15 MG 12 hr tablet Take 1 tablet (15 mg total) by mouth every 12 (twelve) hours. 11/01/22  Yes Massie Maroon, FNP  naloxone [facility]) nasal spray 4 mg/0.1 mL Place 1 spray into the nose once. 09/04/22  Yes [provider]  OLANZapine (ZYPREXA) 5 MG tablet Take 1 tablet (5 mg total) by mouth at bedtime. 05/07/21  Yes Massie Maroon, FNP  oxyCODONE-acetaminophen (PERCOCET) 10-325 MG tablet Take 1 tablet by mouth every 6 (six) hours as needed for up to 15 days for pain. 11/09/22 11/24/22 Yes Massie Maroon, FNP                                                                                                                                    Allergies Buprenorphine hcl, Levaquin [levofloxacin], Meperidine, Morphine, Morphine and codeine, Tramadol, Vancomycin, and Zosyn [piperacillin sod-tazobactam so]  Review of Systems Review of Systems As noted in  HPI  Physical Exam Vital Signs  I have reviewed the triage vital signs BP 121/74 (BP Location: Left Arm)   Pulse 82   Temp 98.7 F (37.1 C) (Oral)   Resp 18   SpO2 100%   Physical Exam Vitals reviewed.  Constitutional:      General: He is not in acute distress.    Appearance: He is well-developed. He is not diaphoretic.  HENT:     Head: Normocephalic and atraumatic.     Right Ear: External  ear normal.     Left Ear: External ear normal.     Nose: Nose normal.     Mouth/Throat:     Mouth: Mucous membranes are moist.  Eyes:     General: No scleral icterus.    Conjunctiva/sclera: Conjunctivae normal.  Neck:     Trachea: Phonation normal.  Cardiovascular:     Rate and Rhythm: Normal rate and regular rhythm.  Pulmonary:     Effort: Pulmonary effort is normal. No respiratory distress.     Breath sounds: No stridor.  Abdominal:     General: There is no distension.  Musculoskeletal:        General: Normal range of motion.     Cervical back: Normal range of motion.  Neurological:     Mental Status: He is alert and oriented to person, place, and time.  Psychiatric:        Behavior: Behavior normal.     ED Results and Treatments Labs (all labs ordered are listed, but only abnormal results are displayed) Labs Reviewed  COMPREHENSIVE METABOLIC PANEL - Abnormal; Notable for the following components:      Result Value   Potassium 3.4 (*)    Glucose, Bld 108 (*)    Creatinine, Ser 0.60 (*)    Total Bilirubin 3.6 (*)    All other components within normal limits  CBC WITH DIFFERENTIAL/PLATELET - Abnormal; Notable for the following components:   RBC 3.57 (*)    Hemoglobin 11.0 (*)    HCT 30.5 (*)    MCHC 36.1 (*)    RDW 19.5 (*)    Platelets 463 (*)    nRBC 1.2 (*)    All other components within normal limits  RETICULOCYTES - Abnormal; Notable for the following components:   Retic Ct Pct 7.5 (*)    RBC. 3.63 (*)    Retic Count, Absolute 273.3 (*)    Immature Retic  Fract 36.7 (*)    All other components within normal limits                                                                                                                         EKG  EKG Interpretation Date/Time:    Ventricular Rate:    PR Interval:    QRS Duration:    QT Interval:    QTC Calculation:   R Axis:      Text Interpretation:         Radiology No results found.  Medications Ordered in ED Medications  dextrose 5 % and 0.45 % NaCl infusion ( Intravenous New Bag/Given 11/21/22 0622)  HYDROmorphone (DILAUDID) injection 1 mg (has no administration in time range)  ondansetron (ZOFRAN) injection 4 mg (4 mg Intravenous Given 11/21/22 0615)  ketorolac (TORADOL) 15 MG/ML injection 15 mg (15 mg Intravenous Given 11/21/22 0615)  HYDROmorphone (DILAUDID) injection 0.5 mg (0.5 mg Intravenous Given 11/21/22 0615)  HYDROmorphone (DILAUDID) injection 1 mg (1 mg Intravenous Given 11/21/22 0650)  Procedures Procedures  (including critical care time) Medical Decision Making / ED Course   Medical Decision Making Amount and/or Complexity of Data Reviewed Labs: ordered. Decision-making details documented in ED Course.  Risk Prescription drug management.    Sickle cell with pain Labs without leukocytosis.  Hemoglobin of 11.  Baseline 11-12.  Hemoglobin is slightly downtrending from previous. CMP with mild hypokalemia.  No other significant electrolyte derangements or renal sufficiency.  Bilirubin is slightly uptrending but close to baseline. Retic count similar to prior. Provided with IV fluids and IV pain medicine.  After first round of pain medicine, patient reported it seems to be improving. Will provide with additional rounds of pain medicine and reassess.  Patient care turned over to oncoming provider. Patient case and results discussed in detail; please see their note for further ED managment.       Final Clinical Impression(s) / ED Diagnoses Final diagnoses:   Sickle cell anemia with pain (HCC)    This chart was dictated using voice recognition software.  Despite best efforts to proofread,  errors can occur which can change the documentation meaning.    Nira Conn, MD 11/21/22 [phone]

## 2022-11-21 NOTE — Discharge Instructions (Signed)
Go to the sickle clinic now

## 2022-11-21 NOTE — Progress Notes (Signed)
Pt admitted to day hospital today from the Emergency Room for sickle cell pain treatment. On arrival, pt rates 9/10 pain to lower back. Pt received Dilaudid PCA and IV Toradol via PIV. Pt also received PO Tylenol and Benadryl. At discharge, pt rates pain 7/10. AVS offered, but pt declined. Pt is alert, oriented, and ambulatory at discharge.

## 2022-11-21 NOTE — Progress Notes (Signed)
Reviewed PDMP substance reporting system prior to prescribing opiate medications. No inconsistencies noted.  Meds ordered this encounter  Medications   oxyCODONE-acetaminophen (PERCOCET) 10-325 MG tablet    Sig: Take 1 tablet by mouth every 6 (six) hours as needed for up to 15 days for pain.    Dispense:  60 tablet    Refill:  0    Order Specific Question:   Supervising Provider    Answer:   Quentin Angst [[id number]]   morphine (MS CONTIN) 15 MG 12 hr tablet    Sig: Take 1 tablet (15 mg total) by mouth every 12 (twelve) hours.    Dispense:  60 tablet    Refill:  0    Order Specific Question:   Supervising Provider    Answer:   Quentin Angst [[id number]]   [name]  APRN, MSN, FNP-C Patient Care [facility] [address] [place], [place] [address] [phone]

## 2022-11-21 NOTE — Telephone Encounter (Signed)
Please advise Kh 

## 2022-11-21 NOTE — Telephone Encounter (Signed)
Please advise KH 

## 2022-11-23 ENCOUNTER — Emergency Department (HOSPITAL_BASED_OUTPATIENT_CLINIC_OR_DEPARTMENT_OTHER)
Admission: EM | Admit: 2022-11-23 | Discharge: 2022-11-23 | Disposition: A | Discharge status: Home / Self Care | Payer: 59 | Attending: Emergency Medicine | Admitting: Emergency Medicine

## 2022-11-23 ENCOUNTER — Other Ambulatory Visit (HOSPITAL_BASED_OUTPATIENT_CLINIC_OR_DEPARTMENT_OTHER): Payer: Self-pay

## 2022-11-23 ENCOUNTER — Encounter (HOSPITAL_BASED_OUTPATIENT_CLINIC_OR_DEPARTMENT_OTHER): Payer: Self-pay

## 2022-11-23 ENCOUNTER — Other Ambulatory Visit: Payer: Self-pay

## 2022-11-23 DIAGNOSIS — G8929 Other chronic pain: Secondary | ICD-10-CM | POA: Diagnosis not present

## 2022-11-23 DIAGNOSIS — Z7901 Long term (current) use of anticoagulants: Secondary | ICD-10-CM | POA: Diagnosis not present

## 2022-11-23 DIAGNOSIS — M545 Low back pain, unspecified: Secondary | ICD-10-CM | POA: Insufficient documentation

## 2022-11-23 DIAGNOSIS — D57219 Sickle-cell/Hb-C disease with crisis, unspecified: Secondary | ICD-10-CM | POA: Insufficient documentation

## 2022-11-23 DIAGNOSIS — D5700 Hb-SS disease with crisis, unspecified: Secondary | ICD-10-CM

## 2022-11-23 DIAGNOSIS — G894 Chronic pain syndrome: Secondary | ICD-10-CM | POA: Diagnosis not present

## 2022-11-23 LAB — COMPREHENSIVE METABOLIC PANEL
ALT: 27 U/L (ref 0–44)
AST: 28 U/L (ref 15–41)
Albumin: 4.4 g/dL (ref 3.5–5.0)
Alkaline Phosphatase: 71 U/L (ref 38–126)
Anion gap: 6 (ref 5–15)
BUN: 6 mg/dL (ref 6–20)
CO2: 30 mmol/L (ref 22–32)
Calcium: 9.5 mg/dL (ref 8.9–10.3)
Chloride: 103 mmol/L (ref 98–111)
Creatinine, Ser: 0.58 mg/dL — ABNORMAL LOW (ref 0.61–1.24)
GFR, Estimated: >60 mL/min (ref >60)
Glucose, Bld: 95 mg/dL (ref 70–99)
Potassium: 4.2 mmol/L (ref 3.5–5.1)
Sodium: 139 mmol/L (ref 135–145)
Total Bilirubin: 2.7 mg/dL — ABNORMAL HIGH (ref 0.3–1.2)
Total Protein: 7.5 g/dL (ref 6.5–8.1)

## 2022-11-23 LAB — RETICULOCYTES
Immature Retic Fract: 37 % — ABNORMAL HIGH (ref 2.3–15.9)
RBC.: 3.54 MIL/uL — ABNORMAL LOW (ref 4.22–5.81)
Retic Count, Absolute: 310.8 10*3/uL — ABNORMAL HIGH (ref 19.0–186.0)
Retic Ct Pct: 8.8 % — ABNORMAL HIGH (ref 0.4–3.1)

## 2022-11-23 LAB — URINALYSIS, ROUTINE W REFLEX MICROSCOPIC
Bilirubin Urine: NEGATIVE
Glucose, UA: NEGATIVE mg/dL
Hgb urine dipstick: NEGATIVE
Ketones, ur: NEGATIVE mg/dL
Leukocytes,Ua: NEGATIVE
Nitrite: NEGATIVE
Specific Gravity, Urine: 1.012 (ref 1.005–1.030)
pH: 8.5 — ABNORMAL HIGH (ref 5.0–8.0)

## 2022-11-23 LAB — CBC WITH DIFFERENTIAL/PLATELET
Abs Immature Granulocytes: 0.05 10*3/uL (ref 0.00–0.07)
Basophils Absolute: 0 10*3/uL (ref 0.0–0.1)
Basophils Relative: 0 %
Eosinophils Absolute: 0.1 10*3/uL (ref 0.0–0.5)
Eosinophils Relative: 1 %
HCT: 29.8 % — ABNORMAL LOW (ref 39.0–52.0)
Hemoglobin: 10.6 g/dL — ABNORMAL LOW (ref 13.0–17.0)
Immature Granulocytes: 1 %
Lymphocytes Relative: 24 %
Lymphs Abs: 2.4 10*3/uL (ref 0.7–4.0)
MCH: 29.9 pg (ref 26.0–34.0)
MCHC: 35.6 g/dL (ref 30.0–36.0)
MCV: 84.2 fL (ref 80.0–100.0)
Monocytes Absolute: 0.7 10*3/uL (ref 0.1–1.0)
Monocytes Relative: 7 %
Neutro Abs: 6.7 10*3/uL (ref 1.7–7.7)
Neutrophils Relative %: 67 %
Platelets: 505 10*3/uL — ABNORMAL HIGH (ref 150–400)
RBC: 3.54 MIL/uL — ABNORMAL LOW (ref 4.22–5.81)
RDW: 19 % — ABNORMAL HIGH (ref 11.5–15.5)
WBC: 9.9 10*3/uL (ref 4.0–10.5)
nRBC: 1.4 % — ABNORMAL HIGH (ref 0.0–0.2)

## 2022-11-23 MED ORDER — HYDROMORPHONE HCL 1 MG/ML IJ SOLN
2.0000 mg | Freq: Once | INTRAMUSCULAR | Status: AC | PRN
  Administered 2022-11-23: 2 mg via SUBCUTANEOUS
  Filled 2022-11-23: qty 2

## 2022-11-23 MED ORDER — OXYCODONE-ACETAMINOPHEN 5-325 MG PO TABS
1.0000 | ORAL_TABLET | Freq: Once | ORAL | Status: AC | PRN
  Administered 2022-11-23: 1 via ORAL
  Filled 2022-11-23: qty 1

## 2022-11-23 MED ORDER — OXYCODONE HCL 5 MG PO TABS
5.0000 mg | ORAL_TABLET | Freq: Once | ORAL | Status: AC | PRN
  Administered 2022-11-23: 5 mg via ORAL
  Filled 2022-11-23: qty 1

## 2022-11-23 MED ORDER — KETOROLAC TROMETHAMINE 15 MG/ML IJ SOLN
15.0000 mg | INTRAMUSCULAR | Status: AC
  Administered 2022-11-23: 15 mg via INTRAVENOUS
  Filled 2022-11-23: qty 1

## 2022-11-23 MED ORDER — MORPHINE SULFATE ER 15 MG PO TBCR: 15.0000 mg | EXTENDED_RELEASE_TABLET | Freq: Two times a day (BID) | ORAL | Status: DC

## 2022-11-23 NOTE — ED Provider Notes (Signed)
EMERGENCY DEPARTMENT AT [facility] Provider Note   CSN: [id number] Arrival date & time: 11/23/22  4098     History  Chief Complaint  Patient presents with   Back Pain    Timothy Marks is a 32 y.o. male, history of sickle cell crisis, who presents to the ED secondary to back pain, this been going on for the last few hours.  He states he has not called the sickle cell clinic, but is out of his Percocets and morphine.  States he is set to pick up his morphine this afternoon, and his Percocets tomorrow.  States his pain is typical of his typical sickle cell pain, and that nothing is different.  Has not had any fevers, chills, swelling of the legs, shortness of breath, abdominal pain, nausea, or vomiting.  Home Medications Prior to Admission medications   Medication Sig Start Date End Date Taking? Authorizing Provider  apixaban (ELIQUIS) 5 MG TABS tablet Take 10 mg by mouth 2 (two) times daily.    [provider]  cholecalciferol (VITAMIN D) 25 MCG (1000 UNIT) tablet Take 1,000 Units by mouth daily.    [provider]  diphenhydrAMINE (BENADRYL) 25 mg capsule Take 1 capsule (25 mg total) by mouth every 6 (six) hours as needed for itching. 08/11/22   Massie Maroon, FNP  folic acid (FOLVITE) 1 MG tablet Take 1 tablet (1 mg total) by mouth daily. 07/05/21   Massie Maroon, FNP  gabapentin (NEURONTIN) 300 MG capsule TAKE 1 CAPSULE(300 MG) BY MOUTH THREE TIMES DAILY Patient taking differently: Take 300 mg by mouth 3 (three) times daily. 08/24/21   Quentin Angst, MD  hydrocortisone 1 % ointment Apply 1 Application topically 2 (two) times daily. Patient taking differently: Apply 1 Application topically 2 (two) times daily as needed for itching. 10/14/21   Quentin Angst, MD  hydroxyurea (HYDREA) 500 MG capsule Take 1 capsule (500 mg total) by mouth 2 (two) times daily. May take with food to minimize GI side effects. Patient taking  differently: Take 500 mg by mouth 2 (two) times daily. 12/21/21   Massie Maroon, FNP  hydrOXYzine (ATARAX) 25 MG tablet Take 25 mg by mouth daily as needed for anxiety or vomiting. 05/19/21   [provider]  ibuprofen (ADVIL) 800 MG tablet Take 800 mg by mouth every 8 (eight) hours as needed.    [provider]  mirtazapine (REMERON) 7.5 MG tablet Take 7.5 mg by mouth at bedtime as needed (for sleep). 05/19/21   [provider]  morphine (MS CONTIN) 15 MG 12 hr tablet Take 1 tablet (15 mg total) by mouth every 12 (twelve) hours. 11/21/22   Massie Maroon, FNP  naloxone [facility]) nasal spray 4 mg/0.1 mL Place 1 spray into the nose once. 09/04/22   [provider]  OLANZapine (ZYPREXA) 5 MG tablet Take 1 tablet (5 mg total) by mouth at bedtime. 05/07/21   Massie Maroon, FNP  oxyCODONE-acetaminophen (PERCOCET) 10-325 MG tablet Take 1 tablet by mouth every 6 (six) hours as needed for up to 15 days for pain. 11/24/22 12/09/22  Massie Maroon, FNP      Allergies    Buprenorphine hcl, Levaquin [levofloxacin], Meperidine, Morphine, Morphine and codeine, Tramadol, Vancomycin, and Zosyn [piperacillin sod-tazobactam so]    Review of Systems   Review of Systems  Musculoskeletal:  Positive for back pain.    Physical Exam Updated Vital Signs BP 114/75   Pulse 81  Temp 99 F (37.2 C) (Oral)   Resp (!) 23   Ht 6\' 2"  (1.88 m)   Wt 56.7 kg   SpO2 99%   BMI 16.05 kg/m  Physical Exam Vitals and nursing note reviewed.  Constitutional:      General: He is not in acute distress.    Appearance: He is well-developed.  HENT:     Head: Normocephalic and atraumatic.  Eyes:     Conjunctiva/sclera: Conjunctivae normal.  Cardiovascular:     Rate and Rhythm: Normal rate and regular rhythm.     Heart sounds: No murmur heard. Pulmonary:     Effort: Pulmonary effort is normal. No respiratory distress.     Breath sounds: Normal breath sounds.  Abdominal:      Palpations: Abdomen is soft.     Tenderness: There is no abdominal tenderness.  Musculoskeletal:        General: No swelling.     Cervical back: Neck supple.     Comments: Mild tenderness to palpation of bilateral paraspinal muscles.  Skin:    General: Skin is warm and dry.     Capillary Refill: Capillary refill takes less than 2 seconds.  Neurological:     Mental Status: He is alert.  Psychiatric:        Mood and Affect: Mood normal.     ED Results / Procedures / Treatments   Labs (all labs ordered are listed, but only abnormal results are displayed) Labs Reviewed  RETICULOCYTES - Abnormal; Notable for the following components:      Result Value   Retic Ct Pct 8.8 (*)    RBC. 3.54 (*)    Retic Count, Absolute 310.8 (*)    Immature Retic Fract 37.0 (*)    All other components within normal limits  COMPREHENSIVE METABOLIC PANEL - Abnormal; Notable for the following components:   Creatinine, Ser 0.58 (*)    Total Bilirubin 2.7 (*)    All other components within normal limits  CBC WITH DIFFERENTIAL/PLATELET - Abnormal; Notable for the following components:   RBC 3.54 (*)    Hemoglobin 10.6 (*)    HCT 29.8 (*)    RDW 19.0 (*)    Platelets 505 (*)    nRBC 1.4 (*)    All other components within normal limits  URINALYSIS, ROUTINE W REFLEX MICROSCOPIC - Abnormal; Notable for the following components:   pH 8.5 (*)    Protein, ur TRACE (*)    All other components within normal limits    EKG None  Radiology No results found.  Procedures Procedures    Medications Ordered in ED Medications  ketorolac (TORADOL) 15 MG/ML injection 15 mg (15 mg Intravenous Given 11/23/22 1012)  oxyCODONE-acetaminophen (PERCOCET/ROXICET) 5-325 MG per tablet 1 tablet (1 tablet Oral Given 11/23/22 0947)    And  oxyCODONE (Oxy IR/ROXICODONE) immediate release tablet 5 mg (5 mg Oral Given 11/23/22 0947)  HYDROmorphone (DILAUDID) injection 2 mg (2 mg Subcutaneous Given 11/23/22 1036)    ED  Course/ Medical Decision Making/ A&P                                 Medical Decision Making Patient is a 32 year old male, here for low back pain this a.m.  He states he is out of his pain meds, and states he needs them.  Supposed to pick them up this afternoon and tomorrow afternoon.  On exam he is well-appearing,  complains of back pain, which is typical for his sickle cell pain.  Is in noted distress.  Will obtain labs, to evaluate for his rechecks, as well as a urinalysis for the back pain.  Will give him a dose of his home pain medicine, and reeval for improvement.  I am suspicious this is his chronic pain, which is untreated currently, as he took too many of his pain pills prior to limiting refilled  Amount and/or Complexity of Data Reviewed Labs: ordered.    Details: Baseline for patient, similar to 2 weeks prior Discussion of management or test interpretation with external provider(s): Blood work is reassuring, very similar to prior.  Has back pain, which is the same for his typical sickle cell pain.  He is well-appearing, in no acute distress.  Was discharged home instructed to follow-up with sickle cell clinic, for his chronic pain.  We discussed return precautions and he voiced understanding.  He was in agreement with plan.  Pain improved after his home dose of Oxy, as well as 1 dose of subcu Dilaudid  Risk Prescription drug management.    Final Clinical Impression(s) / ED Diagnoses Final diagnoses:  Chronic pain syndrome  Sickle cell disease with crisis [facility])    Rx / DC Orders ED Discharge Orders     None         Kellin Fifer Elbert Ewings, PA 11/23/22 1112    Tanda Rockers A, DO 11/23/22 1451

## 2022-11-23 NOTE — Discharge Instructions (Addendum)
Your blood work is overall reassuring today, please follow-up with the sickle cell clinic, return if you have chest pain, shortness of breath, swelling of the legs.  Please pick up your pain medication as prescribed, by the sickle cell clinic.

## 2022-11-23 NOTE — ED Triage Notes (Signed)
In for eval of lower back pain that returned this am. PMH of sickle cell. Out of pain medication. Is due to pick it up later today and again tomorrow. Denies SOB, chest pain, leg pain. Oral hydration at home.

## 2022-11-27 NOTE — Discharge Summary (Signed)
[facility] Discharge Summary   Patient ID: ICHAEL PULLARA MRN: [id number] DOB/AGE: 1990-09-17 32 y.o.  Admit date: 11/21/2022 Discharge date: 11/21/2022  Primary Care Physician:  Quentin Angst, MD  Admission Diagnoses:  Principal Problem:   Sickle cell pain crisis [facility])   Discharge Medications:  Allergies as of 11/21/2022       Reactions   Buprenorphine Hcl Hives   Levaquin [levofloxacin] Itching   Meperidine Rash   Morphine Hives   Morphine And Codeine Hives   Tramadol Hives   Vancomycin Itching   Zosyn [piperacillin Sod-tazobactam So] Itching, Rash, Other (See Comments)   Has taken rocephin in the past        Medication List     TAKE these medications    cholecalciferol 25 MCG (1000 UNIT) tablet Commonly known as: VITAMIN D3 Take 1,000 Units by mouth daily.   diphenhydrAMINE 25 mg capsule Commonly known as: BENADRYL Take 1 capsule (25 mg total) by mouth every 6 (six) hours as needed for itching.   Eliquis 5 MG Tabs tablet Generic drug: apixaban Take 10 mg by mouth 2 (two) times daily.   folic acid 1 MG tablet Commonly known as: FOLVITE Take 1 tablet (1 mg total) by mouth daily.   gabapentin 300 MG capsule Commonly known as: NEURONTIN TAKE 1 CAPSULE(300 MG) BY MOUTH THREE TIMES DAILY What changed: See the new instructions.   hydrocortisone 1 % ointment Apply 1 Application topically 2 (two) times daily. What changed:  when to take this reasons to take this   hydroxyurea 500 MG capsule Commonly known as: HYDREA Take 1 capsule (500 mg total) by mouth 2 (two) times daily. May take with food to minimize GI side effects. What changed: additional instructions   hydrOXYzine 25 MG tablet Commonly known as: ATARAX Take 25 mg by mouth daily as needed for anxiety or vomiting.   ibuprofen 800 MG tablet Commonly known as: ADVIL Take 800 mg by mouth every 8 (eight) hours as needed.   mirtazapine 7.5 MG tablet Commonly known as:  REMERON Take 7.5 mg by mouth at bedtime as needed (for sleep).   morphine 15 MG 12 hr tablet Commonly known as: MS CONTIN Take 1 tablet (15 mg total) by mouth every 12 (twelve) hours.   naloxone 4 MG/0.1ML Liqd nasal spray kit Commonly known as: NARCAN Place 1 spray into the nose once.   OLANZapine 5 MG tablet Commonly known as: ZYPREXA Take 1 tablet (5 mg total) by mouth at bedtime.   oxyCODONE-acetaminophen 10-325 MG tablet Commonly known as: Percocet Take 1 tablet by mouth every 6 (six) hours as needed for up to 15 days for pain.         Consults:  None  Significant Diagnostic Studies:  No results found.  History of Present Illness: Timothy Marks is a 32 year old male with a medical history significant for sickle cell disease, chronic pain syndrome, opiate dependence and tolerance, oppositional defiance d/o, and anemia of chronic disease that presents with complaints of low back pain over the past several days, pain intensity has been unrelieved by Percocet and MS Contin.  Patient last had these medications this morning without sustained relief.  He rates pain as 9/10, constant, and aching.  He has not identified any inciting factors concerning crisis.  Patient denies fever, chills, headache, chest pain, or shortness of breath.  No urinary symptoms, nausea, vomiting, or diarrhea.  No sick contacts or recent travel.  [facility]  Course: Patient admitted to sickle cell day infusion clinic for management of pain crisis.  Reviewed all laboratory values, largely consistent with patient's baseline. Pain managed with IV Dilaudid PCA, IV Toradol, IV fluids, and Tylenol. Pain intensity decreased throughout admission and patient is requesting discharge home. He is alert, oriented, and ambulating without assistance.  He will discharge home in a hemodynamically stable condition.    Discharge Instructions:  Resume all home medications.   Follow up with PCP as  previously  scheduled.   Discussed the importance of drinking 64 ounces of water daily, dehydration of red blood cells may lead further sickling.   Avoid all stressors that precipitate sickle cell pain crisis.     The patient was given clear instructions to go to ER or return to medical center if symptoms do not improve, worsen or new problems develop.    Physical Exam at Discharge:  BP 106/61 (BP Location: Left Arm)   Pulse 66   Temp 98.1 F (36.7 C) (Temporal)   Resp 14   SpO2 96%  Physical Exam Eyes:     Pupils: Pupils are equal, round, and reactive to light.  Cardiovascular:     Rate and Rhythm: Normal rate and regular rhythm.     Pulses: Normal pulses.  Abdominal:     General: Bowel sounds are normal.  Musculoskeletal:        General: Normal range of motion.  Skin:    General: Skin is warm.  Neurological:     General: No focal deficit present.      Disposition at Discharge: Discharge disposition: 01-Home or Self Care       Discharge Orders: Discharge Instructions     Discharge patient   Complete by: As directed    Discharge disposition: 01-Home or Self Care   Discharge patient date: 11/21/2022       Condition at Discharge:   Stable  Time spent on Discharge:  Greater than 30 minutes.  Signed: Julianne Handler 11/27/2022, 11:32 AM

## 2022-11-27 NOTE — H&P (Signed)
[facility] History and Physical   Date: 11/27/2022  Patient name: Timothy Marks Medical record number: [id number] Date of birth: [birth year] Age: 32 y.o. Gender: male PCP: Quentin Angst, MD  Attending physician: No att. providers found  Chief Complaint: Sickle cell pain   History of Present Illness: Timothy Marks is a 32 year old male with a medical history significant for sickle cell disease, chronic pain syndrome, opiate dependence and tolerance, oppositional defiance d/o, and anemia of chronic disease that presents with complaints of low back pain over the past several days, pain intensity has been unrelieved by Percocet and MS Contin.  Patient last had these medications this morning without sustained relief.  He rates pain as 9/10, constant, and aching.  He has not identified any inciting factors concerning crisis.  Patient denies fever, chills, headache, chest pain, or shortness of breath.  No urinary symptoms, nausea, vomiting, or diarrhea.  No sick contacts or recent travel.  Meds: No medications prior to admission.    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   Sickle cell anemia (HCC)    Vitamin D deficiency y-6   Past Surgical History:  Procedure Laterality Date   CHOLECYSTECTOMY     Family History  Problem Relation Age of Onset   Sickle cell trait Mother    Sickle cell trait Father    Social History   Socioeconomic History   Marital status: Single    Spouse name: Not on file   Number of children: Not on file   Years of education: Not on file   Highest education level: 12th grade  Occupational History   Not on file  Tobacco Use    Smoking status: Former    Types: Cigarettes    Passive exposure: Past   Smokeless tobacco: Never  Vaping Use   Vaping status: Never Used  Substance and Sexual Activity   Alcohol use: No   Drug use: Yes    Frequency: 7.0 times per week    Types: Marijuana   Sexual activity: Yes  Other Topics Concern   Not on file  Social History Narrative   Not on file   Social Determinants of Health   Financial Resource Strain: Low Risk  (08/23/2022)   Overall Financial Resource Strain (CARDIA)    Difficulty of Paying Living Expenses: Not hard at all  Food Insecurity: Food Insecurity Present (09/07/2022)   Hunger Vital Sign    Worried About Running Out of Food in the Last Year: Never true    Ran Out of Food in the Last Year: Sometimes true  Transportation Needs: No Transportation Needs (09/07/2022)   PRAPARE - Administrator, Civil Service (Medical): No    Lack of Transportation (Non-Medical): No  Physical Activity: Sufficiently Active (08/23/2022)   Exercise Vital Sign    Days of Exercise per Week: 4 days    Minutes of Exercise per Session: 60 min  Stress: No Stress Concern Present (08/28/2022)   Received from [facility] - Occupational Stress Questionnaire    Feeling of Stress : Not at all  Social Connections: Unknown (08/23/2022)   Social Connection and Isolation Panel [NHANES]    Frequency of Communication  with Friends and Family: Once a week    Frequency of Social Gatherings with Friends and Family: Once a week    Attends Religious Services: Never    Database administrator or Organizations: No    Attends Banker Meetings: Never    Marital Status: Patient declined  Catering manager Violence: Not At Risk (09/07/2022)   Humiliation, Afraid, Rape, and Kick questionnaire    Fear of Current or Ex-Partner: No    Emotionally Abused: No    Physically Abused: No    Sexually Abused: No   Review of Systems  Constitutional:  Negative.  Negative for chills and fever.  HENT: Negative.    Eyes: Negative.   Respiratory: Negative.    Cardiovascular: Negative.   Gastrointestinal: Negative.   Genitourinary: Negative.   Musculoskeletal:  Positive for back pain and joint pain.  Skin: Negative.   Neurological: Negative.   Psychiatric/Behavioral: Negative.      Physical Exam: Blood pressure 106/61, pulse 66, temperature 98.1 F (36.7 C), temperature source Temporal, resp. rate 14, SpO2 96%. Physical Exam Constitutional:      Appearance: Normal appearance.  Eyes:     Pupils: Pupils are equal, round, and reactive to light.  Cardiovascular:     Rate and Rhythm: Normal rate and regular rhythm.     Pulses: Normal pulses.  Pulmonary:     Effort: Pulmonary effort is normal.  Abdominal:     General: Bowel sounds are normal.  Skin:    General: Skin is warm.  Neurological:     General: No focal deficit present.     Mental Status: He is alert. Mental status is at baseline.  Psychiatric:        Mood and Affect: Mood normal.        Behavior: Behavior normal.        Thought Content: Thought content normal.        Judgment: Judgment normal.      Lab results: No results found for this or any previous visit (from the past 24 hour(s)).  Imaging results:  No results found.   Assessment & Plan: Patient admitted to sickle cell day infusion center for management of pain crisis.  Patient is opiate tolerant Initiate IV dilaudid PCA.  IV fluids, 0.45% saline at 100 ml/hr Toradol 15 mg IV times one dose Tylenol 1000 mg by mouth times one dose Review CBC with differential, complete metabolic panel, and reticulocytes as results become available.  Pain intensity will be reevaluated in context of functioning and relationship to baseline as care progresses If pain intensity remains elevated and/or sudden change in hemodynamic stability transition to inpatient services for higher level of care.      [name]  APRN, MSN, FNP-C Patient Care [facility] [address] [place], [place] [address] [phone]  11/27/2022, 11:18 AM

## 2022-12-05 ENCOUNTER — Other Ambulatory Visit: Payer: Self-pay

## 2022-12-05 DIAGNOSIS — G894 Chronic pain syndrome: Secondary | ICD-10-CM

## 2022-12-05 NOTE — Telephone Encounter (Signed)
Please advise KH 

## 2022-12-06 ENCOUNTER — Other Ambulatory Visit: Payer: Self-pay | Admitting: Nurse Practitioner

## 2022-12-06 DIAGNOSIS — G8929 Other chronic pain: Secondary | ICD-10-CM | POA: Diagnosis not present

## 2022-12-06 DIAGNOSIS — G894 Chronic pain syndrome: Secondary | ICD-10-CM | POA: Diagnosis not present

## 2022-12-06 DIAGNOSIS — D5700 Hb-SS disease with crisis, unspecified: Secondary | ICD-10-CM | POA: Diagnosis not present

## 2022-12-06 DIAGNOSIS — M545 Low back pain, unspecified: Secondary | ICD-10-CM | POA: Diagnosis not present

## 2022-12-06 MED ORDER — OXYCODONE-ACETAMINOPHEN 10-325 MG PO TABS: 1.0000 | ORAL_TABLET | Freq: Four times a day (QID) | ORAL | 0 refills | Status: DC | PRN

## 2022-12-06 NOTE — Telephone Encounter (Signed)
Please advise KH 

## 2022-12-06 NOTE — Telephone Encounter (Signed)
Caller & Relationship to patient:  MRN #  [id number]   Call Back Number:   Date of Last Office Visit: 11/21/2022     Date of Next Office Visit: 12/20/2022    Medication(s) to be Refilled: DUE 10/24 Percocet  Preferred Pharmacy:   ** Please notify patient to allow 48-72 hours to process** **Let patient know to contact pharmacy at the end of the day to make sure medication is ready. ** **If patient has not been seen in a year or longer, book an appointment **Advise to use MyChart for refill requests OR to contact their pharmacy

## 2022-12-19 ENCOUNTER — Other Ambulatory Visit: Payer: Self-pay | Admitting: Family Medicine

## 2022-12-19 ENCOUNTER — Other Ambulatory Visit: Payer: Self-pay

## 2022-12-19 ENCOUNTER — Telehealth: Payer: Self-pay | Admitting: Internal Medicine

## 2022-12-19 DIAGNOSIS — G894 Chronic pain syndrome: Secondary | ICD-10-CM

## 2022-12-19 DIAGNOSIS — D571 Sickle-cell disease without crisis: Secondary | ICD-10-CM

## 2022-12-19 MED ORDER — MORPHINE SULFATE ER 15 MG PO TBCR: 15.0000 mg | EXTENDED_RELEASE_TABLET | Freq: Two times a day (BID) | ORAL | 0 refills | Status: DC

## 2022-12-19 NOTE — Telephone Encounter (Signed)
Please advise Kh

## 2022-12-19 NOTE — Telephone Encounter (Signed)
Caller & Relationship to patient:  MRN #  [id number]   Call Back Number:   Date of Last Office Visit: 12/05/2022     Date of Next Office Visit: 12/20/2022    Medication(s) to be Refilled: Morphine percocet  Preferred Pharmacy:   ** Please notify patient to allow 48-72 hours to process** **Let patient know to contact pharmacy at the end of the day to make sure medication is ready. ** **If patient has not been seen in a year or longer, book an appointment **Advise to use MyChart for refill requests OR to contact their pharmacy

## 2022-12-19 NOTE — Progress Notes (Signed)
Reviewed PDMP substance reporting system prior to prescribing opiate medications. No inconsistencies noted.  Meds ordered this encounter  Medications   morphine (MS CONTIN) 15 MG 12 hr tablet    Sig: Take 1 tablet (15 mg total) by mouth every 12 (twelve) hours.    Dispense:  60 tablet    Refill:  0    Order Specific Question:   Supervising Provider    Answer:   JEGEDE, OLUGBEMIGA E [[id number]]   Timothy Marks Macaila Tahir  APRN, MSN, FNP-C [facility] [address]  [place], [place] [address] [phone]  

## 2022-12-20 ENCOUNTER — Encounter: Payer: Self-pay | Admitting: Family Medicine

## 2022-12-20 ENCOUNTER — Ambulatory Visit: Payer: 59 | Admitting: Family Medicine

## 2022-12-20 VITALS — BP 116/53 | HR 74 | Temp 97.8°F | Wt 118.0 lb

## 2022-12-20 DIAGNOSIS — D5700 Hb-SS disease with crisis, unspecified: Secondary | ICD-10-CM

## 2022-12-20 DIAGNOSIS — Z86718 Personal history of other venous thrombosis and embolism: Secondary | ICD-10-CM

## 2022-12-20 DIAGNOSIS — G894 Chronic pain syndrome: Secondary | ICD-10-CM | POA: Diagnosis not present

## 2022-12-20 DIAGNOSIS — E559 Vitamin D deficiency, unspecified: Secondary | ICD-10-CM | POA: Diagnosis not present

## 2022-12-20 NOTE — Progress Notes (Signed)
Established Patient Office Visit  Subjective   Patient ID: Timothy Marks, male    DOB: [birth year]  Age: 32 y.o. MRN: [id number]  Chief Complaint  Patient presents with  . Sickle Cell Anemia    Timothy Marks is a is a 32 year old male with a medical history significant for sickle cell disease, chronic pain syndrome, opiate dependence and tolerance, and history of anemia of chronic disease that presents for follow-up.  Patient has a long history of chronic pain and is complaining about low back pain that has been increasing in frequency and intensity.  Patient is requesting more Percocet per month.  Patient says that he is not taking his MS Contin consistently.  Patient and I have discussed medications in the past.  He has tried [place] and did not respond positively to this medication change.  He requested to be changed back to MS Contin at that time. Currently, patient's pain is rated 5/10.  His chronic pain is mostly to his low back with occasional pain to his lower extremities. He denies any fever, chills, chest pain, shortness of breath, urinary symptoms, nausea, vomiting, or diarrhea.   Patient Active Problem List   Diagnosis Date Noted  . 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 Arm pain, diffuse, right 07/02/2021  . Marijuana smoker 01/27/2021  . Lab test positive for detection of COVID-19 virus 01/23/2020  . Medication management 01/16/2020  . Underweight 06/14/2019  . Generalized weakness 03/31/2019  . Sickle-cell crisis (HCC) 03/24/2019  . SIRS (systemic inflammatory response syndrome) (HCC) 02/10/2019  . Abnormal liver function 11/04/2018  . Sickle cell anemia with crisis (HCC) 05/22/2018  . Narcotic dependence (HCC) 02/10/2018  . Acute bilateral low back pain without sciatica   . Other chronic pain   . Anemia   . Socially inappropriate behavior 12/02/2017  . Thrombocythemia   . Physical deconditioning   . Agitation   . Chronic pain  syndrome   . Recurrent cold sores   . Fever of unknown origin   . Drug-seeking behavior 08/30/2017  . Pain   . Sickle cell crisis (HCC) 07/16/2017  . Adjustment disorder with mixed disturbance of emotions and conduct   . Bilateral pulmonary infiltrates on chest x-ray 11/21/2016  . History of DVT (deep vein thrombosis) 11/17/2016  . Suicidal ideations 10/11/2016  . Reticulocytosis 10/09/2016  . DVT (deep venous thrombosis) (HCC) 08/02/2016  . Has difficulty accessing primary care provider for most visits 07/11/2015  . 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 Hordeolum externum of left upper eyelid 05/31/2015  . Major depressive disorder with current active episode 11/09/2014  . Hb-SS disease with acute chest syndrome (HCC) 11/08/2014  . Pneumonia of right upper lobe due to infectious organism 11/07/2014  . Constipation 08/03/2014  . Leukocytosis 06/30/2014  . 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 Protein-calorie malnutrition, severe (HCC) 04/09/2014  . 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 Vitamin D deficiency   . Sickle cell anemia (HCC) 03/30/2014  . Hyperbilirubinemia 03/30/2014  . Hypokalemia 02/07/2014  . Mandible fracture (HCC) 05/03/2013  . Aggressive behavior 01/25/2013  . 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 Sickle cell anemia with pain (HCC) 01/24/2013  . Sickle cell pain crisis (HCC) 01/24/2013  . 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 Other hemoglobinopathies (HCC) 04/22/2011  . 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 Depression   . Generalized weakness 03/31/2019  .  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 Sickle cell anemia (HCC)   . Vitamin D deficiency y-6   Past Surgical History:  Procedure Laterality Date  . CHOLECYSTECTOMY     Social History   Tobacco Use  .  Smoking status: Former    Types: Cigarettes    Passive exposure: Past  . Smokeless tobacco: Never  Vaping Use  . Vaping status: Never Used  Substance Use Topics  . Alcohol use: No  . Drug use: Yes    Frequency: 7.0 times per week    Types: Marijuana   Social History   Socioeconomic History  . Marital status: Single    Spouse name: Not on file  . Number of children: Not on file  . Years of education: Not on file  . Highest education level: 12th grade  Occupational History  . Not on file  Tobacco Use  . Smoking status: Former    Types: Cigarettes    Passive exposure: Past  . Smokeless tobacco: Never  Vaping Use  . Vaping status: Never Used  Substance and Sexual Activity  . Alcohol use: No  . Drug use: Yes    Frequency: 7.0 times per week    Types: Marijuana  . Sexual activity: Yes  Other Topics Concern  . Not on file  Social History Narrative  . Not on file   Social Determinants of Health   Financial Resource Strain: Medium Risk (12/19/2022)   Overall Financial Resource Strain (CARDIA)   . Difficulty of Paying Living Expenses: Somewhat hard  Food Insecurity: No Food Insecurity (12/19/2022)   Hunger Vital Sign   . Worried About Programme researcher, broadcasting/film/video in the Last Year: Never true   . Ran Out of Food in the Last Year: Never true  Transportation Needs: No Transportation Needs (12/19/2022)   PRAPARE - Transportation   . Lack of Transportation (Medical): No   . Lack of Transportation (Non-Medical): No  Physical Activity: Sufficiently Active (12/19/2022)   Exercise Vital Sign   . Days of Exercise per Week: 5 days   . Minutes of Exercise per Session: 120 min  Stress: No Stress Concern Present (12/19/2022)   [employer] of Occupational Health - Occupational Stress Questionnaire   . Feeling of Stress : Only a little  Social Connections: Unknown (12/19/2022)   Social Connection and Isolation Panel [NHANES]   . Frequency of Communication with Friends and Family: More than  three times a week   . Frequency of Social Gatherings with Friends and Family: Once a week   . Attends Religious Services: Never   . Active Member of Clubs or Organizations: No   . Attends Banker Meetings: Never   . Marital Status: Patient declined  Intimate Partner Violence: Not At Risk (09/07/2022)   Humiliation, Afraid, Rape, and Kick questionnaire   . Fear of Current or Ex-Partner: No   . Emotionally Abused: No   . Physically Abused: No   . Sexually Abused: No   Family Status  Relation Name Status  . Mother  Alive  . Father  Alive  No partnership data on file   Family History  Problem Relation Age of Onset  . Sickle cell trait Mother   . Sickle cell trait Father       Review of Systems  Constitutional:  Negative for chills and fever.  HENT: Negative.    Eyes: Negative.   Respiratory: Negative.  Cardiovascular: Negative.   Musculoskeletal:  Positive for back pain.      Objective:     BP (!) 116/53   Pulse 74   Temp 97.8 F (36.6 C)   Wt 118 lb (53.5 kg)   SpO2 99%   BMI 15.15 kg/m  BP Readings from Last 3 Encounters:  12/20/22 (!) 116/53  11/23/22 114/75  11/21/22 106/61   Wt Readings from Last 3 Encounters:  12/20/22 118 lb (53.5 kg)  11/23/22 125 lb (56.7 kg)  11/06/22 125 lb (56.7 kg)      Physical Exam Constitutional:      Appearance: Normal appearance.  Eyes:     Pupils: Pupils are equal, round, and reactive to light.  Cardiovascular:     Rate and Rhythm: Normal rate and regular rhythm.  Pulmonary:     Effort: Pulmonary effort is normal.  Abdominal:     General: Bowel sounds are normal.  Neurological:     General: No focal deficit present.     Mental Status: He is oriented to person, place, and time.  Psychiatric:        Mood and Affect: Mood normal.        Behavior: Behavior normal.    No results found for any visits on 12/20/22.  Last CBC Lab Results  Component Value Date   WBC 9.9 11/23/2022   HGB 10.6 (L)  11/23/2022   HCT 29.8 (L) 11/23/2022   MCV 84.2 11/23/2022   MCH 29.9 11/23/2022   RDW 19.0 (H) 11/23/2022   PLT 505 (H) 11/23/2022   Last metabolic panel Lab Results  Component Value Date   GLUCOSE 95 11/23/2022   NA 139 11/23/2022   K 4.2 11/23/2022   CL 103 11/23/2022   CO2 30 11/23/2022   BUN 6 11/23/2022   CREATININE 0.58 (L) 11/23/2022   GFRNONAA >60 11/23/2022   CALCIUM 9.5 11/23/2022   PHOS 3.5 01/23/2020   PROT 7.5 11/23/2022   ALBUMIN 4.4 11/23/2022   LABGLOB 3.8 09/20/2022   AGRATIO 1.0 (L) 05/17/2022   BILITOT 2.7 (H) 11/23/2022   ALKPHOS 71 11/23/2022   AST 28 11/23/2022   ALT 27 11/23/2022   ANIONGAP 6 11/23/2022   Last lipids No results found for: "CHOL", "HDL", "LDLCALC", "LDLDIRECT", "TRIG", "CHOLHDL" Last hemoglobin A1c No results found for: "HGBA1C" Last thyroid functions Lab Results  Component Value Date   TSH 0.884 08/31/2018   T4TOTAL 8.1 08/31/2018   Last vitamin D Lab Results  Component Value Date   VD25OH 40.7 09/20/2022      The ASCVD Risk score (Arnett DK, et al., 2019) failed to calculate for the following reasons:   The 2019 ASCVD risk score is only valid for ages 60 to 57    Assessment & Plan:   Problem List Items Addressed This Visit       Other   Vitamin D deficiency (Chronic)   Sickle cell pain crisis (HCC)   Relevant Orders   [id number] 11+Oxyco+Alc+Crt-Bund   History of DVT (deep vein thrombosis)   Chronic pain syndrome - Primary   Relevant Orders   [id number] 11+Oxyco+Alc+Crt-Bund   1. Sickle cell pain crisis (HCC) No medication changes will occur at this time.  Patient is currently at 65 MME's per day and no increases will be made at primary care.  This clinic will continue with Percocet 10-325 mg every 6 hours #60 every 15 days and MS Contin 15 mg #60 every 30 days.  Patient has requested increasing  monthly amount of Percocet several times, there will be no changes in this clinic.  Pain management referral recommended,  patient declined. - [id number] 11+Oxyco+Alc+Crt-Bund  2. Chronic pain syndrome  - [id number] 11+Oxyco+Alc+Crt-Bund  3. Vitamin D deficiency Continue vitamin D supplementation and vitamin D fortified diet.  4. History of DVT (deep vein thrombosis)   Continue to follow-up every 3 months for medication management.  [name]  APRN, MSN, FNP-C Patient Care [facility] [address] [place], [place] [address] [phone]

## 2022-12-22 DIAGNOSIS — G894 Chronic pain syndrome: Secondary | ICD-10-CM

## 2022-12-22 MED ORDER — OXYCODONE-ACETAMINOPHEN 10-325 MG PO TABS: 1.0000 | ORAL_TABLET | Freq: Four times a day (QID) | ORAL | 0 refills | Status: DC | PRN

## 2022-12-22 NOTE — Telephone Encounter (Signed)
Reviewed PDMP substance reporting system prior to prescribing opiate medications. No inconsistencies noted.  Meds ordered this encounter  Medications   oxyCODONE-acetaminophen (PERCOCET) 10-325 MG tablet    Sig: Take 1 tablet by mouth every 6 (six) hours as needed for up to 15 days for pain.    Dispense:  60 tablet    Refill:  0    Order Specific Question:   Supervising Provider    Answer:   JEGEDE, OLUGBEMIGA E [[id number]]   Temprance Wyre Moore Linzie Boursiquot  APRN, MSN, FNP-C [facility] Care [facility] [address]  [place], [place] [address] [phone]  

## 2022-12-28 LAB — DRUG SCREEN 764883 11+OXYCO+ALC+CRT-BUND
Amphetamines, Urine: NEGATIVE ng/mL
BENZODIAZ UR QL: NEGATIVE ng/mL
Barbiturate: NEGATIVE ng/mL
Cocaine (Metabolite): NEGATIVE ng/mL
Creatinine: 182 mg/dL (ref 20.0–300.0)
Ethanol: NEGATIVE %
Meperidine: NEGATIVE ng/mL
Methadone Screen, Urine: NEGATIVE ng/mL
Oxycodone/Oxymorphone, Urine: NEGATIVE ng/mL
Phencyclidine: NEGATIVE ng/mL
Propoxyphene: NEGATIVE ng/mL
Tramadol: NEGATIVE ng/mL
pH, Urine: 6.2 (ref 4.5–8.9)

## 2022-12-28 LAB — CANNABINOID CONFIRMATION, UR
CANNABINOIDS: POSITIVE — AB
Carboxy THC GC/MS Conf: >750 ng/mL

## 2022-12-28 LAB — OPIATES CONFIRMATION, URINE
Codeine: NEGATIVE
Hydrocodone: NEGATIVE
Hydromorphone: NEGATIVE
Morphine Confirm: 29458 ng/mL
Morphine: POSITIVE — AB
Opiates: POSITIVE ng/mL — AB

## 2023-01-02 ENCOUNTER — Other Ambulatory Visit: Payer: Self-pay

## 2023-01-02 DIAGNOSIS — G894 Chronic pain syndrome: Secondary | ICD-10-CM

## 2023-01-02 NOTE — Telephone Encounter (Signed)
Please advise Kh

## 2023-01-04 ENCOUNTER — Non-Acute Institutional Stay (HOSPITAL_COMMUNITY)
Admission: AD | Admit: 2023-01-04 | Discharge: 2023-01-04 | Disposition: A | Discharge status: Home / Self Care | Payer: 59 | Source: Ambulatory Visit | Attending: Internal Medicine | Admitting: Internal Medicine

## 2023-01-04 ENCOUNTER — Telehealth (HOSPITAL_COMMUNITY): Payer: Self-pay

## 2023-01-04 DIAGNOSIS — G894 Chronic pain syndrome: Secondary | ICD-10-CM | POA: Diagnosis not present

## 2023-01-04 DIAGNOSIS — M545 Low back pain, unspecified: Secondary | ICD-10-CM | POA: Insufficient documentation

## 2023-01-04 DIAGNOSIS — Z87891 Personal history of nicotine dependence: Secondary | ICD-10-CM | POA: Insufficient documentation

## 2023-01-04 DIAGNOSIS — D5700 Hb-SS disease with crisis, unspecified: Secondary | ICD-10-CM | POA: Diagnosis not present

## 2023-01-04 DIAGNOSIS — Z5986 Financial insecurity: Secondary | ICD-10-CM | POA: Diagnosis not present

## 2023-01-04 DIAGNOSIS — F1120 Opioid dependence, uncomplicated: Secondary | ICD-10-CM | POA: Diagnosis not present

## 2023-01-04 DIAGNOSIS — M79605 Pain in left leg: Secondary | ICD-10-CM | POA: Insufficient documentation

## 2023-01-04 DIAGNOSIS — M79604 Pain in right leg: Secondary | ICD-10-CM | POA: Insufficient documentation

## 2023-01-04 LAB — COMPREHENSIVE METABOLIC PANEL
ALT: 14 U/L (ref 0–44)
AST: 31 U/L (ref 15–41)
Albumin: 4.5 g/dL (ref 3.5–5.0)
Alkaline Phosphatase: 72 U/L (ref 38–126)
Anion gap: 8 (ref 5–15)
BUN: 9 mg/dL (ref 6–20)
CO2: 26 mmol/L (ref 22–32)
Calcium: 9.1 mg/dL (ref 8.9–10.3)
Chloride: 102 mmol/L (ref 98–111)
Creatinine, Ser: 0.48 mg/dL — ABNORMAL LOW (ref 0.61–1.24)
GFR, Estimated: >60 mL/min (ref >60)
Glucose, Bld: 93 mg/dL (ref 70–99)
Potassium: 3.4 mmol/L — ABNORMAL LOW (ref 3.5–5.1)
Sodium: 136 mmol/L (ref 135–145)
Total Bilirubin: 4.9 mg/dL — ABNORMAL HIGH (ref <1.2)
Total Protein: 8.1 g/dL (ref 6.5–8.1)

## 2023-01-04 LAB — CBC WITH DIFFERENTIAL/PLATELET
Abs Immature Granulocytes: 0.08 10*3/uL — ABNORMAL HIGH (ref 0.00–0.07)
Basophils Absolute: 0 10*3/uL (ref 0.0–0.1)
Basophils Relative: 0 %
Eosinophils Absolute: 0.4 10*3/uL (ref 0.0–0.5)
Eosinophils Relative: 4 %
HCT: 27.6 % — ABNORMAL LOW (ref 39.0–52.0)
Hemoglobin: 9.4 g/dL — ABNORMAL LOW (ref 13.0–17.0)
Immature Granulocytes: 1 %
Lymphocytes Relative: 26 %
Lymphs Abs: 2.5 10*3/uL (ref 0.7–4.0)
MCH: 30 pg (ref 26.0–34.0)
MCHC: 34.1 g/dL (ref 30.0–36.0)
MCV: 88.2 fL (ref 80.0–100.0)
Monocytes Absolute: 1 10*3/uL (ref 0.1–1.0)
Monocytes Relative: 10 %
Neutro Abs: 5.7 10*3/uL (ref 1.7–7.7)
Neutrophils Relative %: 59 %
Platelets: 475 10*3/uL — ABNORMAL HIGH (ref 150–400)
RBC: 3.13 MIL/uL — ABNORMAL LOW (ref 4.22–5.81)
RDW: 20.6 % — ABNORMAL HIGH (ref 11.5–15.5)
WBC: 9.7 10*3/uL (ref 4.0–10.5)
nRBC: 2.4 % — ABNORMAL HIGH (ref 0.0–0.2)

## 2023-01-04 LAB — RETICULOCYTES
Immature Retic Fract: 32.1 % — ABNORMAL HIGH (ref 2.3–15.9)
RBC.: 3.15 MIL/uL — ABNORMAL LOW (ref 4.22–5.81)
Retic Count, Absolute: 359.5 10*3/uL — ABNORMAL HIGH (ref 19.0–186.0)
Retic Ct Pct: 12 % — ABNORMAL HIGH (ref 0.4–3.1)

## 2023-01-04 MED ORDER — ACETAMINOPHEN 500 MG PO TABS
1000.0000 mg | ORAL_TABLET | Freq: Once | ORAL | Status: AC
  Administered 2023-01-04: 1000 mg via ORAL
  Filled 2023-01-04: qty 2

## 2023-01-04 MED ORDER — SODIUM CHLORIDE 0.9% FLUSH: 9.0000 mL | INTRAVENOUS | Status: DC | PRN

## 2023-01-04 MED ORDER — HYDROMORPHONE 1 MG/ML IV SOLN
INTRAVENOUS | Status: DC
  Administered 2023-01-04: 6.5 mg via INTRAVENOUS
  Administered 2023-01-04: 30 mg via INTRAVENOUS
  Filled 2023-01-04: qty 30

## 2023-01-04 MED ORDER — DIPHENHYDRAMINE HCL 25 MG PO CAPS
25.0000 mg | ORAL_CAPSULE | ORAL | Status: DC | PRN
  Administered 2023-01-04: 25 mg via ORAL
  Filled 2023-01-04: qty 1

## 2023-01-04 MED ORDER — NALOXONE HCL 0.4 MG/ML IJ SOLN: 0.4000 mg | INTRAMUSCULAR | Status: DC | PRN

## 2023-01-04 MED ORDER — HYDROXYZINE HCL 25 MG PO TABS
25.0000 mg | ORAL_TABLET | Freq: Once | ORAL | Status: AC
  Administered 2023-01-04: 25 mg via ORAL
  Filled 2023-01-04: qty 1

## 2023-01-04 MED ORDER — KETOROLAC TROMETHAMINE 30 MG/ML IJ SOLN
15.0000 mg | Freq: Once | INTRAMUSCULAR | Status: AC
  Administered 2023-01-04: 15 mg via INTRAVENOUS
  Filled 2023-01-04: qty 1

## 2023-01-04 MED ORDER — ONDANSETRON HCL 4 MG/2ML IJ SOLN
4.0000 mg | Freq: Four times a day (QID) | INTRAMUSCULAR | Status: DC | PRN
  Administered 2023-01-04: 4 mg via INTRAVENOUS
  Filled 2023-01-04: qty 2

## 2023-01-04 NOTE — Progress Notes (Addendum)
Pt admitted to the day hospital by [place] Hollis FNP for treatment of sickle cell pain crisis. Pt reported pain to bilateral legs and back rated a 9/10. PIV placed by IV team per pt preference. Pt given PO Benadryl and Tylenol, IV toradol and zofran, placed on Dilaudid PCA 0.5/10/3, and hydrated with IV fluids at [facility]. At discharge patient reported pain a 7/10. Pt declined AVS. Pt alert , oriented and ambulatory at discharge.

## 2023-01-04 NOTE — H&P (Signed)
[facility] History and Physical   Date: 01/04/2023  Patient name: Timothy Marks Medical record number: [id number] Date of birth: 04-Nov-1990 Age: 32 y.o. Gender: male PCP: Quentin Angst, MD  Attending physician: Quentin Angst, MD  Chief Complaint: Sickle cell pain   History of present illness:  Nikash Bense is a 32 year old male with a medical history significant for sickle cell disease, chronic pain syndrome, opiate dependence and tolerance, history of anemia of chronic disease, and oppositional defiance presents with complaints of pain primarily to low back and lower extremities that is consistent with his previous sickle cell pain crisis.  Patient states that pain intensity has been elevating over the past several days and has been unrelieved by his home medications.  He last had MS Contin around 6 AM without very much relief.  Patient rates his pain as 9/10, intermittent, and aching.  He denies fever, chills, chest pain, or shortness of breath.  No urinary symptoms, nausea, vomiting, or diarrhea.  No sick contacts or recent travel.  Meds: Medications Prior to Admission  Medication Sig Dispense Refill Last Dose   apixaban (ELIQUIS) 5 MG TABS tablet Take 10 mg by mouth 2 (two) times daily.      cholecalciferol (VITAMIN D) 25 MCG (1000 UNIT) tablet Take 1,000 Units by mouth daily.      diphenhydrAMINE (BENADRYL) 25 mg capsule Take 1 capsule (25 mg total) by mouth every 6 (six) hours as needed for itching. 30 capsule 1    folic acid (FOLVITE) 1 MG tablet Take 1 tablet (1 mg total) by mouth daily. 90 tablet 2    gabapentin (NEURONTIN) 300 MG capsule TAKE 1 CAPSULE(300 MG) BY MOUTH THREE TIMES DAILY (Patient taking differently: Take 300 mg by mouth 3 (three) times daily.) 90 capsule 5    hydrocortisone 1 % ointment Apply 1 Application topically 2 (two) times daily. (Patient taking differently: Apply 1 Application topically 2 (two) times daily as needed for  itching.) 30 g 0    hydroxyurea (HYDREA) 500 MG capsule Take 1 capsule (500 mg total) by mouth 2 (two) times daily. May take with food to minimize GI side effects. (Patient taking differently: Take 500 mg by mouth 2 (two) times daily.) 180 capsule 2    hydrOXYzine (ATARAX) 25 MG tablet Take 25 mg by mouth daily as needed for anxiety or vomiting.      ibuprofen (ADVIL) 800 MG tablet Take 800 mg by mouth every 8 (eight) hours as needed.      mirtazapine (REMERON) 7.5 MG tablet Take 7.5 mg by mouth at bedtime as needed (for sleep).      morphine (MS CONTIN) 15 MG 12 hr tablet Take 1 tablet (15 mg total) by mouth every 12 (twelve) hours. 60 tablet 0    naloxone (NARCAN) nasal spray 4 mg/0.1 mL Place 1 spray into the nose once.      OLANZapine (ZYPREXA) 5 MG tablet Take 1 tablet (5 mg total) by mouth at bedtime. 30 tablet 2    oxyCODONE-acetaminophen (PERCOCET) 10-325 MG tablet Take 1 tablet by mouth every 6 (six) hours as needed for up to 15 days for pain. 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  Sickle cell anemia (HCC)    Vitamin D deficiency y-6   Past Surgical History:  Procedure Laterality Date   CHOLECYSTECTOMY     Family History  Problem Relation Age of Onset   Sickle cell trait Mother    Sickle cell trait Father    Social History   Socioeconomic History   Marital status: Single    Spouse name: Not on file   Number of children: Not on file   Years of education: Not on file   Highest education level: 12th grade  Occupational History   Not on file  Tobacco Use   Smoking status: Former    Types: Cigarettes    Passive exposure: Past   Smokeless tobacco:  Never  Vaping Use   Vaping status: Never Used  Substance and Sexual Activity   Alcohol use: No   Drug use: Yes    Frequency: 7.0 times per week    Types: Marijuana   Sexual activity: Yes  Other Topics Concern   Not on file  Social History Narrative   Not on file   Social Determinants of Health   Financial Resource Strain: Medium Risk (12/19/2022)   Overall Financial Resource Strain (CARDIA)    Difficulty of Paying Living Expenses: Somewhat hard  Food Insecurity: No Food Insecurity (12/19/2022)   Hunger Vital Sign    Worried About Running Out of Food in the Last Year: Never true    Ran Out of Food in the Last Year: Never true  Transportation Needs: No Transportation Needs (12/19/2022)   PRAPARE - Administrator, Civil Service (Medical): No    Lack of Transportation (Non-Medical): No  Physical Activity: Sufficiently Active (12/19/2022)   Exercise Vital Sign    Days of Exercise per Week: 5 days    Minutes of Exercise per Session: 120 min  Stress: No Stress Concern Present (12/19/2022)   [employer] of Occupational Health - Occupational Stress Questionnaire    Feeling of Stress : Only a little  Social Connections: Unknown (12/19/2022)   Social Connection and Isolation Panel [NHANES]    Frequency of Communication with Friends and Family: More than three times a week    Frequency of Social Gatherings with Friends and Family: Once a week    Attends Religious Services: Never    Database administrator or Organizations: No    Attends Banker Meetings: Never    Marital Status: Patient declined  Catering manager Violence: Not At Risk (09/07/2022)   Humiliation, Afraid, Rape, and Kick questionnaire    Fear of Current or Ex-Partner: No    Emotionally Abused: No    Physically Abused: No    Sexually Abused: No   Review of Systems  Constitutional:  Negative for chills and fever.  HENT: Negative.    Respiratory: Negative.    Gastrointestinal: Negative.    Genitourinary: Negative.   Musculoskeletal:  Positive for back pain and joint pain.  Skin: Negative.   Neurological: Negative.   Endo/Heme/Allergies: Negative.   Psychiatric/Behavioral: Negative.      Physical Exam: There were no vitals taken for this visit. Physical Exam Constitutional:      Appearance: Normal appearance.  Eyes:     Pupils: Pupils are equal, round, and reactive to light.  Cardiovascular:     Rate and Rhythm: Normal rate.  Pulmonary:     Effort: Pulmonary effort is normal.  Abdominal:     General: Bowel sounds are normal.  Musculoskeletal:  General: Normal range of motion.  Neurological:     General: No focal deficit present.     Mental Status: He is alert. Mental status is at baseline.  Psychiatric:        Mood and Affect: Mood normal.        Behavior: Behavior normal.        Thought Content: Thought content normal.        Judgment: Judgment normal.      Lab results: No results found for this or any previous visit (from the past 24 hour(s)).  Imaging results:  No results found.   Assessment & Plan: Patient admitted to sickle cell day infusion center for management of pain crisis.  Patient is opiate tolerant Initiate IV dilaudid PCA.   Toradol 15 mg IV times one dose Tylenol 1000 mg by mouth times one dose Review CBC with differential, complete metabolic panel, and reticulocytes as results become available. Pain intensity will be reevaluated in context of functioning and relationship to baseline as care progresses If pain intensity remains elevated and/or sudden change in hemodynamic stability transition to inpatient services for higher level of care.       [name]  APRN, MSN, FNP-C Patient Care [facility] [address] [place], [place] [address] [phone]  01/04/2023, 9:56 AM

## 2023-01-04 NOTE — Telephone Encounter (Signed)
Patient called in. Complains of pain in back and legs bilateral rates 9/10. Denied chest pain, abd pain, fever, N/V/D, or priapism. Wants to come in for treatment. Last took morphine  at 6:00am. Pt denies recent ED visits. Denies covid exposure or flu like symptoms. Pt states his girlfriend is his transportation to and from center today. [place] FNP notified, approved pt to be seen in the day hospital. Pt notified, verbalized understanding.

## 2023-01-04 NOTE — Discharge Summary (Signed)
[facility] Dis[facility]   Patient ID: Timothy Marks MRN: [id number] DOB/AGE: Feb 17, 1990 32 y.o.  Admit date: 01/04/2023 Discharge date: 01/04/2023  Primary Care Physician:  Quentin Angst, MD  Admission Diagnoses:  Principal Problem:   Sickle cell pain crisis [facility])   Discharge Medications:  Allergies as of 01/04/2023       Reactions   Buprenorphine Hcl Hives   Levaquin [levofloxacin] Itching   Meperidine Rash   Morphine Hives   Morphine And Codeine Hives   Tramadol Hives   Vancomycin Itching   Zosyn [piperacillin Sod-tazobactam So] Itching, Rash, Other (See Comments)   Has taken rocephin in the past        Medication List     TAKE these medications    cholecalciferol 25 MCG (1000 UNIT) tablet Commonly known as: VITAMIN D3 Take 1,000 Units by mouth daily.   diphenhydrAMINE 25 mg capsule Commonly known as: BENADRYL Take 1 capsule (25 mg total) by mouth every 6 (six) hours as needed for itching.   Eliquis 5 MG Tabs tablet Generic drug: apixaban Take 10 mg by mouth 2 (two) times daily.   folic acid 1 MG tablet Commonly known as: FOLVITE Take 1 tablet (1 mg total) by mouth daily.   gabapentin 300 MG capsule Commonly known as: NEURONTIN TAKE 1 CAPSULE(300 MG) BY MOUTH THREE TIMES DAILY What changed: See the new instructions.   hydrocortisone 1 % ointment Apply 1 Application topically 2 (two) times daily. What changed:  when to take this reasons to take this   hydroxyurea 500 MG capsule Commonly known as: HYDREA Take 1 capsule (500 mg total) by mouth 2 (two) times daily. May take with food to minimize GI side effects. What changed: additional instructions   hydrOXYzine 25 MG tablet Commonly known as: ATARAX Take 25 mg by mouth daily as needed for anxiety or vomiting.   ibuprofen 800 MG tablet Commonly known as: ADVIL Take 800 mg by mouth every 8 (eight) hours as needed.   mirtazapine 7.5 MG tablet Commonly known as:  REMERON Take 7.5 mg by mouth at bedtime as needed (for sleep).   morphine 15 MG 12 hr tablet Commonly known as: MS CONTIN Take 1 tablet (15 mg total) by mouth every 12 (twelve) hours.   naloxone 4 MG/0.1ML Liqd nasal spray kit Commonly known as: NARCAN Place 1 spray into the nose once.   OLANZapine 5 MG tablet Commonly known as: ZYPREXA Take 1 tablet (5 mg total) by mouth at bedtime.   oxyCODONE-acetaminophen 10-325 MG tablet Commonly known as: Percocet Take 1 tablet by mouth every 6 (six) hours as needed for up to 15 days for pain.         Consults:  None  Significant Diagnostic Studies:  No results found.  History of present illness:  Timothy Marks is a 32 year old male with a medical history significant for sickle cell disease, chronic pain syndrome, opiate dependence and tolerance, history of anemia of chronic disease, and oppositional defiance presents with complaints of pain primarily to low back and lower extremities that is consistent with his previous sickle cell pain crisis.  Patient states that pain intensity has been elevating over the past several days and has been unrelieved by his home medications.  He last had MS Contin around 6 AM without very much relief.  Patient rates his pain as 9/10, intermittent, and aching.  He denies fever, chills, chest pain, or shortness of breath.  No urinary symptoms, nausea, vomiting, or  diarrhea.  No sick contacts or recent travel. [facility] Course: Patient admitted to sickle cell day infusion clinic for management of pain crisis. Reviewed labs, consistent with baseline. Pain managed with IV Dilaudid PCA, IV Toradol, Tylenol, and Atarax. Pain intensity decreased to 6/10 and patient is requesting discharge home. He is alert, oriented, and ambulating without assistance.  Patient will discharge home in a hemodynamically stable condition.  Discharge instructions: Resume all home medications.   Follow up with PCP as  previously  scheduled.   Discussed the importance of drinking 64 ounces of water daily, dehydration of red blood cells may lead further sickling.   Avoid all stressors that precipitate sickle cell pain crisis.     The patient was given clear instructions to go to ER or return to medical center if symptoms do not improve, worsen or new problems develop.   Physical Exam at Discharge:  BP (!) 96/50 (BP Location: Right Arm)   Pulse 64   Temp 98.8 F (37.1 C) (Temporal)   Resp 12   SpO2 93%   Physical Exam Constitutional:      Appearance: Normal appearance.  Eyes:     Pupils: Pupils are equal, round, and reactive to light.  Cardiovascular:     Rate and Rhythm: Normal rate and regular rhythm.     Pulses: Normal pulses.  Pulmonary:     Effort: Pulmonary effort is normal.  Abdominal:     General: Abdomen is flat. Bowel sounds are normal.  Neurological:     General: No focal deficit present.     Mental Status: Mental status is at baseline.  Psychiatric:        Mood and Affect: Mood normal.        Behavior: Behavior normal.        Thought Content: Thought content normal.        Judgment: Judgment normal.      Disposition at Discharge: Discharge disposition: 01-Home or Self Care       Discharge Orders: Discharge Instructions     Discharge patient   Complete by: As directed    Discharge disposition: 01-Home or Self Care   Discharge patient date: 01/04/2023       Condition at Discharge:   Stable  Time spent on Discharge:  Greater than 30 minutes.  Signed: [name]  APRN, MSN, FNP-C Patient Care [facility] [address] [place], [place] [address] [phone]  01/04/2023, 2:49 PM

## 2023-01-06 ENCOUNTER — Other Ambulatory Visit: Payer: Self-pay

## 2023-01-06 ENCOUNTER — Telehealth: Payer: Self-pay | Admitting: Internal Medicine

## 2023-01-06 ENCOUNTER — Other Ambulatory Visit: Payer: Self-pay | Admitting: Family Medicine

## 2023-01-06 DIAGNOSIS — G894 Chronic pain syndrome: Secondary | ICD-10-CM

## 2023-01-06 MED ORDER — OXYCODONE-ACETAMINOPHEN 10-325 MG PO TABS: 1.0000 | ORAL_TABLET | Freq: Four times a day (QID) | ORAL | 0 refills | Status: DC | PRN

## 2023-01-06 NOTE — Progress Notes (Signed)
Reviewed PDMP substance reporting system prior to prescribing opiate medications. No inconsistencies noted.   Meds ordered this encounter  Medications  . oxyCODONE-acetaminophen (PERCOCET) 10-325 MG tablet    Sig: Take 1 tablet by mouth every 6 (six) hours as needed for up to 15 days for pain.    Dispense:  60 tablet    Refill:  0    Order Specific Question:   Supervising Provider    Answer:   Quentin Angst [[id number]]    [name]  APRN, MSN, FNP-C Patient Care [facility] [address] [place], [place] [address] [phone]

## 2023-01-06 NOTE — Telephone Encounter (Signed)
Pt has appointment 03/21/23 with Mitzi Davenport

## 2023-01-06 NOTE — Telephone Encounter (Signed)
Copied from CRM [id number]. Topic: Clinical - Medication Refill >> Jan 06, 2023  2:45 PM Tiffany H wrote: Most Recent Primary Care Visit:  Provider: Massie Maroon  Department: [facility] CARE CENTR  Visit Type: OFFICE VISIT  Date: 12/20/2022  Medication: Percocet 10-325mg  4x   Has the patient contacted their pharmacy? No (Agent: If no, request that the patient contact the pharmacy for the refill. If patient does not wish to contact the pharmacy document the reason why and proceed with request.) (Agent: If yes, when and what did the pharmacy advise?)  Is this the correct pharmacy for this prescription? Yes If no, delete pharmacy and type the correct one.  This is the patient's preferred pharmacy:  [facility] [phone] - [place], [place] - [address] AT [facility] OF [address] [facility] [address] [place] [address] Phone: [phone] Fax: [phone]  [place] - [facility] [address] [address] Phone: [phone] Fax: [phone]  [facility] [address] [place]), [place] - [address] [place]) [place] [address] Phone: [phone] Fax: [phone]  [facility] [address] [place] [address] Phone: [phone] Fax: [phone]   Has the prescription been filled recently? Yes  Is the patient out of the medication? No  Has the patient been seen for an appointment in the last year OR does the patient have an upcoming appointment? Yes  Can we respond through MyChart? Yes  Agent: Please be advised that Rx refills may take up to 3 business days. We ask that you follow-up with your pharmacy.

## 2023-01-06 NOTE — Telephone Encounter (Signed)
Copied from CRM [id number]. Topic: Clinical - Medication Refill >> Jan 06, 2023  2:50 PM [name] H wrote: Most Recent Primary Care Visit:  Provider: Massie Maroon  Department: [facility]  Visit Type: OFFICE VISIT  Date: 12/20/2022  Medication: oxyCODONE-acetaminophen (PERCOCET) 10-325 MG tablet  Has the patient contacted their pharmacy? No (Agent: If no, request that the patient contact the pharmacy for the refill. If patient does not wish to contact the pharmacy document the reason why and proceed with request.) (Agent: If yes, when and what did the pharmacy advise?)  Is this the correct pharmacy for this prescription? Yes If no, delete pharmacy and type the correct one.  This is the patient's preferred pharmacy:  [facility] [phone] - [place], [place] - [address] AT [facility] OF [address] [place] [address] Phone: [phone] Fax: [phone]   Has the prescription been filled recently? No  Is the patient out of the medication? No, will be on SUNDAY  Has the patient been seen for an appointment in the last year OR does the patient have an upcoming appointment? Yes  Can we respond through MyChart? Yes  Agent: Please be advised that Rx refills may take up to 3 business days. We ask that you follow-up with your pharmacy.

## 2023-01-08 DIAGNOSIS — R5383 Other fatigue: Secondary | ICD-10-CM | POA: Diagnosis not present

## 2023-01-08 DIAGNOSIS — Z131 Encounter for screening for diabetes mellitus: Secondary | ICD-10-CM | POA: Diagnosis not present

## 2023-01-08 DIAGNOSIS — H6123 Impacted cerumen, bilateral: Secondary | ICD-10-CM | POA: Diagnosis not present

## 2023-01-08 DIAGNOSIS — E559 Vitamin D deficiency, unspecified: Secondary | ICD-10-CM | POA: Diagnosis not present

## 2023-01-08 DIAGNOSIS — Z1159 Encounter for screening for other viral diseases: Secondary | ICD-10-CM | POA: Diagnosis not present

## 2023-01-08 DIAGNOSIS — Z79899 Other long term (current) drug therapy: Secondary | ICD-10-CM | POA: Diagnosis not present

## 2023-01-08 DIAGNOSIS — Z0189 Encounter for other specified special examinations: Secondary | ICD-10-CM | POA: Diagnosis not present

## 2023-01-08 DIAGNOSIS — Z13220 Encounter for screening for lipoid disorders: Secondary | ICD-10-CM | POA: Diagnosis not present

## 2023-01-08 DIAGNOSIS — Z0000 Encounter for general adult medical examination without abnormal findings: Secondary | ICD-10-CM | POA: Diagnosis not present

## 2023-01-16 ENCOUNTER — Other Ambulatory Visit: Payer: Self-pay

## 2023-01-16 DIAGNOSIS — G894 Chronic pain syndrome: Secondary | ICD-10-CM

## 2023-01-16 DIAGNOSIS — D571 Sickle-cell disease without crisis: Secondary | ICD-10-CM

## 2023-01-16 MED ORDER — MORPHINE SULFATE ER 15 MG PO TBCR: 15.0000 mg | EXTENDED_RELEASE_TABLET | Freq: Two times a day (BID) | ORAL | 0 refills | Status: DC

## 2023-01-16 NOTE — Telephone Encounter (Signed)
Please advise KH 

## 2023-01-17 ENCOUNTER — Other Ambulatory Visit (HOSPITAL_COMMUNITY): Payer: Self-pay | Admitting: Nurse Practitioner

## 2023-01-17 DIAGNOSIS — G894 Chronic pain syndrome: Secondary | ICD-10-CM

## 2023-01-17 MED ORDER — MIRTAZAPINE 7.5 MG PO TABS: 7.5000 mg | ORAL_TABLET | Freq: Every evening | ORAL | 1 refills | Status: DC | PRN

## 2023-01-17 MED ORDER — VITAMIN D3 25 MCG (1000 UNIT) PO TABS: 1000.0000 [IU] | ORAL_TABLET | Freq: Every day | ORAL | 1 refills | Status: DC

## 2023-01-17 MED ORDER — HYDROXYZINE HCL 25 MG PO TABS: 25.0000 mg | ORAL_TABLET | Freq: Every day | ORAL | 1 refills | Status: DC | PRN

## 2023-01-17 MED ORDER — DIPHENHYDRAMINE HCL 25 MG PO CAPS: 25.0000 mg | ORAL_CAPSULE | Freq: Four times a day (QID) | ORAL | 1 refills | Status: AC | PRN

## 2023-01-18 MED ORDER — MORPHINE SULFATE ER 15 MG PO TBCR: 15.0000 mg | EXTENDED_RELEASE_TABLET | Freq: Two times a day (BID) | ORAL | 0 refills | Status: DC

## 2023-01-18 NOTE — Telephone Encounter (Signed)
Reviewed PDMP substance reporting system prior to prescribing opiate medications. No inconsistencies noted.  Meds ordered this encounter  Medications   morphine (MS CONTIN) 15 MG 12 hr tablet    Sig: Take 1 tablet (15 mg total) by mouth every 12 (twelve) hours.    Dispense:  60 tablet    Refill:  0    Order Specific Question:   Supervising Provider    Answer:   JEGEDE, OLUGBEMIGA E [[id number]]   Timothy Ammon Moore Macaila Tahir  APRN, MSN, FNP-C Patient Care [facility] [address]  [place], [place] [address] [phone]  

## 2023-01-19 ENCOUNTER — Telehealth: Payer: Self-pay | Admitting: Internal Medicine

## 2023-01-19 NOTE — Telephone Encounter (Signed)
Copied from CRM [id number]. Topic: Clinical - Medication Refill >> Jan 19, 2023  9:44 AM Thomes Dinning wrote: Most Recent Primary Care Visit:  Provider: Massie Maroon  Department: [facility]  Visit Type: OFFICE VISIT  Date: 12/20/2022  Medication: morphine (MS CONTIN) 15 MG 12 hr tablet  Has the patient contacted their pharmacy? Yes (Agent: If no, request that the patient contact the pharmacy for the refill. If patient does not wish to contact the pharmacy document the reason why and proceed with request.) (Agent: If yes, when and what did the pharmacy advise?)  Is this the correct pharmacy for this prescription? Yes If no, delete pharmacy and type the correct one.  This is the patient's preferred pharmacy:  [facility] [address] [place] [address] Phone: [phone] Fax: [phone]  [facility] [address] [place]), [place] - [address] [place] [address] Phone: [phone] Fax: [phone]  [facility] [address] [place] [address] Phone: [phone] Fax: [phone]   Has the prescription been filled recently? No  Is the patient out of the medication? Yes  Has the patient been seen for an appointment in the last year OR does the patient have an upcoming appointment? Yes  Can we respond through MyChart? Yes  Agent: Please be advised that Rx refills may take up to 3 business days. We ask that you follow-up with your pharmacy.

## 2023-01-20 ENCOUNTER — Other Ambulatory Visit: Payer: Self-pay

## 2023-01-20 MED ORDER — MIRTAZAPINE 7.5 MG PO TABS: 7.5000 mg | ORAL_TABLET | Freq: Every evening | ORAL | 0 refills | Status: DC | PRN

## 2023-01-20 MED ORDER — HYDROXYZINE HCL 25 MG PO TABS: 25.0000 mg | ORAL_TABLET | Freq: Every day | ORAL | 3 refills | Status: DC | PRN

## 2023-01-20 MED ORDER — VITAMIN D3 25 MCG (1000 UNIT) PO TABS: 1000.0000 [IU] | ORAL_TABLET | Freq: Every day | ORAL | 5 refills | Status: DC

## 2023-01-20 NOTE — Telephone Encounter (Signed)
Please advise KH 

## 2023-01-21 ENCOUNTER — Other Ambulatory Visit (HOSPITAL_COMMUNITY): Payer: Self-pay

## 2023-01-23 ENCOUNTER — Other Ambulatory Visit: Payer: Self-pay | Admitting: Family Medicine

## 2023-01-23 ENCOUNTER — Telehealth: Payer: Self-pay

## 2023-01-23 DIAGNOSIS — G894 Chronic pain syndrome: Secondary | ICD-10-CM

## 2023-01-23 MED ORDER — OXYCODONE-ACETAMINOPHEN 10-325 MG PO TABS: 1.0000 | ORAL_TABLET | Freq: Four times a day (QID) | ORAL | 0 refills | Status: DC | PRN

## 2023-01-23 NOTE — Progress Notes (Deleted)
   Acute Office Visit  Subjective:     Patient ID: Timothy Marks, male    DOB: December 17, 1990, 32 y.o.   MRN: [id number]  No chief complaint on file.   HPI Patient is in today for ***  ROS      Objective:    There were no vitals taken for this visit. {Vitals History (Optional):23777}  Physical Exam  No results found for any visits on 01/23/23.      Assessment & Plan:   Problem List Items Addressed This Visit   None   No orders of the defined types were placed in this encounter.   No follow-ups on file.  [name]  APRN, MSN, FNP-C Patient Care [facility] [address] [place], [place] [address] [phone]

## 2023-01-23 NOTE — Telephone Encounter (Signed)
Copied from CRM [id number]. Topic: General - Other >> Jan 23, 2023  2:36 PM Colletta Maryland S wrote: Reason for CRM: Pt is returning missed call, callback number is [phone]  Please advise if you have called the pt . KH

## 2023-01-23 NOTE — Telephone Encounter (Signed)
Copied from CRM [id number]. Topic: Clinical - Medication Question >> Jan 23, 2023  9:15 AM [name] H wrote: Reason for CRM: Pt states he has been calling about his refills because they have not been sent over and he needs them filled ASAP. He states morphine (MS CONTIN) 15 MG 12 hr tablet can be sent to [facility] and the oxyCODONE-acetaminophen (PERCOCET) 10-325 MG tablet can be sent to [facility] on file.Marland Kitchen  Please advise [facility]

## 2023-01-25 ENCOUNTER — Other Ambulatory Visit (HOSPITAL_COMMUNITY): Payer: Self-pay

## 2023-01-25 ENCOUNTER — Ambulatory Visit: Payer: Self-pay | Admitting: Internal Medicine

## 2023-01-25 NOTE — Telephone Encounter (Signed)
Chief Complaint: medication refill not available at pharmacy  Additional Notes: Patient states his morphine 15mg  tabs not available at [facility]. Pt states he last spoke with pharmacy on 01-20-23 and they told him it was on back order. Pt states he has not followed up since then. Pt is requested his prescription be moved to [facility]. When trying to provide care advice and informing patient about expected length of time for prescription processing, pt states "Francesca Oman will just see me in the clinic then" and hung up on triager.  Copied from CRM [id number]. Topic: Clinical - Medication Question >> Jan 25, 2023 10:35 AM Joanette Gula wrote: Reason for CRM: Pt. [facility] that [facility] or the other [facility] has his medication and he's in severe pain Reason for Disposition  Patient has refills remaining on their prescription  Answer Assessment - Initial Assessment Questions 1. DRUG NAME: "What medicine do you need to have refilled?"     Morphine, pt states his prescription refill is at [facility] but he states he spoke with the pharmacy on Friday and they state it is on backorder.  2. REFILLS REMAINING: "How many refills are remaining?" (Note: The label on the medicine or pill bottle will show how many refills are remaining. If there are no refills remaining, then a renewal may be needed.)     Unsure.  3. EXPIRATION DATE: "What is the expiration date?" (Note: The label states when the prescription will expire, and thus can no longer be refilled.)     Pt states he ran out, last dose was last week  4. PRESCRIBING HCP: "Who prescribed it?" Reason: If prescribed by specialist, call should be referred to that group.     FNP [place]  5. SYMPTOMS: "Do you have any symptoms?"     Pt complains of 7/10 pain in his lower back.  Protocols used: Medication Refill and Renewal Call-A-AH

## 2023-01-26 NOTE — Telephone Encounter (Signed)
Pt was advised he would have to have a meeting with Dr. Shela Commons. He stated he is free any day that works with Dr. Shela Commons.   .Brayton Caves

## 2023-01-27 ENCOUNTER — Other Ambulatory Visit (HOSPITAL_COMMUNITY): Payer: Self-pay

## 2023-01-30 ENCOUNTER — Other Ambulatory Visit: Payer: Self-pay

## 2023-01-30 ENCOUNTER — Other Ambulatory Visit (HOSPITAL_COMMUNITY): Payer: Self-pay

## 2023-01-30 DIAGNOSIS — G894 Chronic pain syndrome: Secondary | ICD-10-CM

## 2023-01-30 DIAGNOSIS — D571 Sickle-cell disease without crisis: Secondary | ICD-10-CM

## 2023-01-30 NOTE — Telephone Encounter (Unsigned)
Copied from CRM [id number]. Topic: Clinical - Prescription Issue >> Jan 30, 2023 10:18 AM Gaetano Hawthorne wrote: Reason for CRM: Patient is calling to inform the office that [facility] that the MS Contin/Morphine was on back order - he would like to have his prescription for MS Contin/Morphine to be sent to:  [facility] [address] [place], [place], [place] [address] [phone]

## 2023-01-31 ENCOUNTER — Other Ambulatory Visit (HOSPITAL_COMMUNITY): Payer: Self-pay

## 2023-01-31 ENCOUNTER — Other Ambulatory Visit (HOSPITAL_BASED_OUTPATIENT_CLINIC_OR_DEPARTMENT_OTHER): Payer: Self-pay

## 2023-02-01 ENCOUNTER — Telehealth: Payer: Self-pay

## 2023-02-01 ENCOUNTER — Other Ambulatory Visit: Payer: Self-pay | Admitting: Internal Medicine

## 2023-02-01 DIAGNOSIS — G894 Chronic pain syndrome: Secondary | ICD-10-CM

## 2023-02-01 DIAGNOSIS — D571 Sickle-cell disease without crisis: Secondary | ICD-10-CM

## 2023-02-01 MED ORDER — MORPHINE SULFATE ER 15 MG PO TBCR: 15.0000 mg | EXTENDED_RELEASE_TABLET | Freq: Two times a day (BID) | ORAL | 0 refills | Status: DC

## 2023-02-01 NOTE — Telephone Encounter (Signed)
Copied from CRM [id number]. Topic: Clinical - Medication Refill >> Feb 01, 2023 12:45 PM Antony Haste wrote: Most Recent Primary Care Visit:  Provider: Massie Maroon  Department: [facility] CARE CENTR  Visit Type: OFFICE VISIT  Date: 12/20/2022  Medication: morphine (MS CONTIN) 15 MG 12 hr tablet [[id number]]  Has the patient contacted their pharmacy? Yes (Agent: If no, request that the patient contact the pharmacy for the refill. If patient does not wish to contact the pharmacy document the reason why and proceed with request.) (Agent: If yes, when and what did the pharmacy advise?)  Is this the correct pharmacy for this prescription? Yes If no, delete pharmacy and type the correct one.  This is the patient's preferred pharmacy:  [facility] [phone] - [place], [place] - [address] AT [facility] OF [address] [facility] [address] [place] [address] Phone: [phone] Fax: [phone]  [place] - [facility] [address] [place] [address] Phone: [phone] Fax: [phone]  [facility] [address] [place]), [place] - [address] [place] [address] Phone: [phone] Fax: [phone]  [facility] [address] [place] [address] Phone: [phone] Fax: [phone]   Has the prescription been filled recently?   Is the patient out of the medication?   Has the patient been seen for an appointment in the last year OR does the patient have an upcoming appointment?   Can we respond through MyChart?   Agent: Please be advised that Rx refills may take up to 3 business days. We ask that you follow-up with your pharmacy.

## 2023-02-01 NOTE — Telephone Encounter (Unsigned)
Copied from CRM [id number]. Topic: Clinical - Medication Question >> Feb 01, 2023 11:27 AM Joanette Gula wrote: This Patient has been calling for weeks now to get his medication which has been on back order.. He stated that no one from the clinic has called him to further advise.

## 2023-02-03 ENCOUNTER — Other Ambulatory Visit: Payer: Self-pay

## 2023-02-03 DIAGNOSIS — G894 Chronic pain syndrome: Secondary | ICD-10-CM

## 2023-02-03 NOTE — Telephone Encounter (Signed)
Not due per pt just wanted to make sure request was in  due to it being at Christmas eve.  Thanks [employer]

## 2023-02-03 NOTE — Telephone Encounter (Signed)
Pt was called to advise that his med refill was sent to Dr. Hyman Hopes. He will send in. [facility]

## 2023-02-06 ENCOUNTER — Other Ambulatory Visit: Payer: Self-pay | Admitting: Internal Medicine

## 2023-02-06 ENCOUNTER — Telehealth: Payer: Self-pay

## 2023-02-06 DIAGNOSIS — G894 Chronic pain syndrome: Secondary | ICD-10-CM

## 2023-02-06 MED ORDER — OXYCODONE-ACETAMINOPHEN 10-325 MG PO TABS: 1.0000 | ORAL_TABLET | Freq: Four times a day (QID) | ORAL | 0 refills | Status: DC | PRN

## 2023-02-06 NOTE — Telephone Encounter (Signed)
Copied from CRM [id number]. Topic: Clinical - Medication Refill >> Feb 06, 2023  8:22 AM Bobbye Morton wrote: Most Recent Primary Care Visit:  Provider: Massie Maroon  Department: [facility]  Visit Type: OFFICE VISIT  Date: 12/20/2022  Medication: oxyCODONE-acetaminophen (PERCOCET) 10-325 MG tablet  Has the patient contacted their pharmacy? Yes (Agent: If no, request that the patient contact the pharmacy for the refill. If patient does not wish to contact the pharmacy document the reason why and proceed with request.) (Agent: If yes, when and what did the pharmacy advise?)  Is this the correct pharmacy for this prescription? Yes If no, delete pharmacy and type the correct one.  This is the patient's preferred pharmacy:  [facility] [phone] - [place], [place] - [address] AT [facility] OF [address] [place] [address] Phone: [phone] Fax: [phone]    Has the prescription been filled recently? Yes  Is the patient out of the medication? Yes  Has the patient been seen for an appointment in the last year OR does the patient have an upcoming appointment? Yes  Can we respond through MyChart? Yes  Agent: Please be advised that Rx refills may take up to 3 business days. We ask that you follow-up with your pharmacy.

## 2023-02-06 NOTE — Telephone Encounter (Signed)
Copied from CRM [id number]. Topic: Clinical - Medication Refill >> Feb 06, 2023  9:54 AM Timothy Marks wrote: Reason for CRM: Pt called in regards to oxyCODONE-acetaminophen (PERCOCET) 10-325 MG tablet refill, says he is in a lot of pain. Med refill request already pending.   Due to request pending there is nothing we can do

## 2023-02-16 ENCOUNTER — Other Ambulatory Visit: Payer: Self-pay

## 2023-02-16 DIAGNOSIS — G894 Chronic pain syndrome: Secondary | ICD-10-CM

## 2023-02-16 NOTE — Telephone Encounter (Signed)
 Please advise [facility]

## 2023-02-17 ENCOUNTER — Other Ambulatory Visit: Payer: Self-pay | Admitting: Internal Medicine

## 2023-02-17 DIAGNOSIS — G894 Chronic pain syndrome: Secondary | ICD-10-CM

## 2023-02-17 NOTE — Telephone Encounter (Signed)
 Copied from CRM [id number]. Topic: Clinical - Medication Refill >> Feb 17, 2023  2:45 PM Delon DASEN wrote: Most Recent Primary Care Visit:  Provider: TILFORD BERTRAM HERO  Department: [facility] CARE CEN[facility]  Visit Type: OFFICE VISIT  Date: 12/20/2022  Medication: ***  Has the patient contacted their pharmacy?  (Agent: If no, request that the patient contact the pharmacy for the refill. If patient does not wish to contact the pharmacy document the reason why and proceed with request.) (Agent: If yes, when and what did the pharmacy advise?)  Is this the correct pharmacy for this prescription?  If no, delete pharmacy and type the correct one.  This is the patient's preferred pharmacy:  [facility] [phone] - [place],  - [address] AT [facility] OF [address] [facility] [address] [place] [address] Phone: [phone] Fax: [phone]  [place] [facility] [address] [place] [address] Phone: [phone] Fax: [phone]  [facility] [address] [place]), [place] - [address] [place] [address] Phone: [phone] Fax: [phone]  [facility] [address] [place] [address] Phone: [phone] Fax: [phone]   Has the prescription been filled recently?   Is the patient out of the medication?   Has the patient been seen for an appointment in the last year OR does the patient have an upcoming appointment?   Can we respond through MyChart?   Agent: Please be advised that Rx refills may take up to 3 business days. We ask that you follow-up with your pharmacy.

## 2023-02-19 ENCOUNTER — Other Ambulatory Visit: Payer: Self-pay | Admitting: Internal Medicine

## 2023-02-20 ENCOUNTER — Other Ambulatory Visit: Payer: Self-pay | Admitting: Internal Medicine

## 2023-02-20 NOTE — Telephone Encounter (Unsigned)
 Copied from CRM [id number]. Topic: Clinical - Medication Refill >> Feb 20, 2023  8:52 AM Tonda B wrote: Most Recent Primary Care Visit:  Provider: TILFORD BERTRAM HERO  Department: [facility]  Visit Type: OFFICE VISIT  Date: 12/20/2022  Medication: ***  Has the patient contacted their pharmacy? Yes (Agent: If no, request that the patient contact the pharmacy for the refill. If patient does not wish to contact the pharmacy document the reason why and proceed with request.) (Agent: If yes, when and what did the pharmacy advise?)  Is this the correct pharmacy for this prescription? Yes If no, delete pharmacy and type the correct one.  This is the patient's preferred pharmacy:  [facility] [phone] - [place], [place] - [address] AT [facility] OF [address]  [place] [address] Phone: [phone] Fax: [phone]     Has the prescription been filled recently? No  Is the patient out of the medication? Yes  Has the patient been seen for an appointment in the last year OR does the patient have an upcoming appointment? Yes  Can we respond through MyChart? No  Agent: Please be advised that Rx refills may take up to 3 business days. We ask that you follow-up with your pharmacy.

## 2023-02-21 ENCOUNTER — Telehealth: Payer: Self-pay

## 2023-02-21 ENCOUNTER — Other Ambulatory Visit: Payer: Self-pay | Admitting: Internal Medicine

## 2023-02-21 DIAGNOSIS — G894 Chronic pain syndrome: Secondary | ICD-10-CM

## 2023-02-21 MED ORDER — OXYCODONE-ACETAMINOPHEN 10-325 MG PO TABS: 1.0000 | ORAL_TABLET | Freq: Four times a day (QID) | ORAL | 0 refills | Status: AC | PRN

## 2023-02-21 NOTE — Telephone Encounter (Signed)
 Please advise [facility]

## 2023-02-21 NOTE — Telephone Encounter (Signed)
 Copied from CRM [id number]. Topic: Clinical - Prescription Issue >> Feb 21, 2023 11:40 AM Montie POUR wrote: Reason for CRM: [facility] said they have not received the refill for oxyCODONE -acetaminophen  (PERCOCET) 10-325 MG tablet 60 tablet. Please send a message through MyChart when completed.

## 2023-02-27 ENCOUNTER — Inpatient Hospital Stay (HOSPITAL_COMMUNITY)
Admission: AD | Admit: 2023-02-27 | Discharge: 2023-03-06 | DRG: 812 | Disposition: A | Discharge status: Home / Self Care | Payer: Medicare HMO | Source: Ambulatory Visit | Attending: Internal Medicine | Admitting: Internal Medicine

## 2023-02-27 ENCOUNTER — Other Ambulatory Visit: Payer: Self-pay

## 2023-02-27 ENCOUNTER — Telehealth (HOSPITAL_COMMUNITY): Payer: Self-pay

## 2023-02-27 DIAGNOSIS — M549 Dorsalgia, unspecified: Secondary | ICD-10-CM | POA: Diagnosis present

## 2023-02-27 DIAGNOSIS — E43 Unspecified severe protein-calorie malnutrition: Secondary | ICD-10-CM | POA: Diagnosis present

## 2023-02-27 DIAGNOSIS — F32A Depression, unspecified: Secondary | ICD-10-CM | POA: Diagnosis present

## 2023-02-27 DIAGNOSIS — F1120 Opioid dependence, uncomplicated: Secondary | ICD-10-CM | POA: Diagnosis present

## 2023-02-27 DIAGNOSIS — Z888 Allergy status to other drugs, medicaments and biological substances status: Secondary | ICD-10-CM | POA: Diagnosis not present

## 2023-02-27 DIAGNOSIS — D72829 Elevated white blood cell count, unspecified: Secondary | ICD-10-CM | POA: Diagnosis not present

## 2023-02-27 DIAGNOSIS — G894 Chronic pain syndrome: Secondary | ICD-10-CM | POA: Diagnosis present

## 2023-02-27 DIAGNOSIS — Z7901 Long term (current) use of anticoagulants: Secondary | ICD-10-CM

## 2023-02-27 DIAGNOSIS — Z79899 Other long term (current) drug therapy: Secondary | ICD-10-CM

## 2023-02-27 DIAGNOSIS — E559 Vitamin D deficiency, unspecified: Secondary | ICD-10-CM | POA: Diagnosis present

## 2023-02-27 DIAGNOSIS — E876 Hypokalemia: Secondary | ICD-10-CM | POA: Diagnosis present

## 2023-02-27 DIAGNOSIS — D5700 Hb-SS disease with crisis, unspecified: Secondary | ICD-10-CM | POA: Diagnosis not present

## 2023-02-27 DIAGNOSIS — Z87891 Personal history of nicotine dependence: Secondary | ICD-10-CM | POA: Diagnosis not present

## 2023-02-27 DIAGNOSIS — D571 Sickle-cell disease without crisis: Secondary | ICD-10-CM | POA: Diagnosis not present

## 2023-02-27 DIAGNOSIS — Z885 Allergy status to narcotic agent status: Secondary | ICD-10-CM

## 2023-02-27 DIAGNOSIS — Z832 Family history of diseases of the blood and blood-forming organs and certain disorders involving the immune mechanism: Secondary | ICD-10-CM | POA: Diagnosis not present

## 2023-02-27 DIAGNOSIS — Z881 Allergy status to other antibiotic agents status: Secondary | ICD-10-CM | POA: Diagnosis not present

## 2023-02-27 DIAGNOSIS — R5081 Fever presenting with conditions classified elsewhere: Secondary | ICD-10-CM | POA: Diagnosis not present

## 2023-02-27 DIAGNOSIS — D5701 Hb-SS disease with acute chest syndrome: Secondary | ICD-10-CM | POA: Diagnosis not present

## 2023-02-27 DIAGNOSIS — Z9049 Acquired absence of other specified parts of digestive tract: Secondary | ICD-10-CM

## 2023-02-27 DIAGNOSIS — D638 Anemia in other chronic diseases classified elsewhere: Secondary | ICD-10-CM | POA: Diagnosis present

## 2023-02-27 DIAGNOSIS — F419 Anxiety disorder, unspecified: Secondary | ICD-10-CM | POA: Diagnosis present

## 2023-02-27 DIAGNOSIS — R000 Tachycardia, unspecified: Secondary | ICD-10-CM | POA: Diagnosis not present

## 2023-02-27 DIAGNOSIS — R4689 Other symptoms and signs involving appearance and behavior: Secondary | ICD-10-CM | POA: Diagnosis present

## 2023-02-27 LAB — CBC WITH DIFFERENTIAL/PLATELET
Abs Immature Granulocytes: 0.03 10*3/uL (ref 0.00–0.07)
Basophils Absolute: 0 10*3/uL (ref 0.0–0.1)
Basophils Relative: 0 %
Eosinophils Absolute: 0.3 10*3/uL (ref 0.0–0.5)
Eosinophils Relative: 4 %
HCT: 30.5 % — ABNORMAL LOW (ref 39.0–52.0)
Hemoglobin: 10.2 g/dL — ABNORMAL LOW (ref 13.0–17.0)
Immature Granulocytes: 0 %
Lymphocytes Relative: 23 %
Lymphs Abs: 1.9 10*3/uL (ref 0.7–4.0)
MCH: 29.8 pg (ref 26.0–34.0)
MCHC: 33.4 g/dL (ref 30.0–36.0)
MCV: 89.2 fL (ref 80.0–100.0)
Monocytes Absolute: 1 10*3/uL (ref 0.1–1.0)
Monocytes Relative: 12 %
Neutro Abs: 5.1 10*3/uL (ref 1.7–7.7)
Neutrophils Relative %: 61 %
Platelets: 425 10*3/uL — ABNORMAL HIGH (ref 150–400)
RBC: 3.42 MIL/uL — ABNORMAL LOW (ref 4.22–5.81)
RDW: 18 % — ABNORMAL HIGH (ref 11.5–15.5)
WBC: 8.4 10*3/uL (ref 4.0–10.5)
nRBC: 1.2 % — ABNORMAL HIGH (ref 0.0–0.2)

## 2023-02-27 LAB — COMPREHENSIVE METABOLIC PANEL
ALT: 11 U/L (ref 0–44)
AST: 24 U/L (ref 15–41)
Albumin: 4 g/dL (ref 3.5–5.0)
Alkaline Phosphatase: 58 U/L (ref 38–126)
Anion gap: 8 (ref 5–15)
BUN: 9 mg/dL (ref 6–20)
CO2: 26 mmol/L (ref 22–32)
Calcium: 8.5 mg/dL — ABNORMAL LOW (ref 8.9–10.3)
Chloride: 101 mmol/L (ref 98–111)
Creatinine, Ser: 0.57 mg/dL — ABNORMAL LOW (ref 0.61–1.24)
GFR, Estimated: >60 mL/min (ref >60)
Glucose, Bld: 92 mg/dL (ref 70–99)
Potassium: 3.4 mmol/L — ABNORMAL LOW (ref 3.5–5.1)
Sodium: 135 mmol/L (ref 135–145)
Total Bilirubin: 2.1 mg/dL — ABNORMAL HIGH (ref 0.0–1.2)
Total Protein: 7.3 g/dL (ref 6.5–8.1)

## 2023-02-27 LAB — LACTATE DEHYDROGENASE: LDH: 258 U/L — ABNORMAL HIGH (ref 98–192)

## 2023-02-27 MED ORDER — SENNOSIDES-DOCUSATE SODIUM 8.6-50 MG PO TABS
1.0000 | ORAL_TABLET | Freq: Two times a day (BID) | ORAL | Status: DC
  Administered 2023-02-27 – 2023-03-06 (×14): 1 via ORAL
  Filled 2023-02-27 (×14): qty 1

## 2023-02-27 MED ORDER — DEXTROSE-SODIUM CHLORIDE 5-0.45 % IV SOLN: INTRAVENOUS | Status: AC

## 2023-02-27 MED ORDER — ONDANSETRON HCL 4 MG/2ML IJ SOLN
4.0000 mg | Freq: Four times a day (QID) | INTRAMUSCULAR | Status: DC | PRN
  Administered 2023-02-27 (×3): 4 mg via INTRAVENOUS
  Filled 2023-02-27 (×3): qty 2

## 2023-02-27 MED ORDER — NALOXONE HCL 0.4 MG/ML IJ SOLN: 0.4000 mg | INTRAMUSCULAR | Status: DC | PRN

## 2023-02-27 MED ORDER — APIXABAN 5 MG PO TABS
5.0000 mg | ORAL_TABLET | Freq: Two times a day (BID) | ORAL | Status: DC
  Administered 2023-02-27 – 2023-03-06 (×14): 5 mg via ORAL
  Filled 2023-02-27 (×14): qty 1

## 2023-02-27 MED ORDER — MIRTAZAPINE 15 MG PO TABS
7.5000 mg | ORAL_TABLET | Freq: Every evening | ORAL | Status: DC | PRN
  Administered 2023-02-27 – 2023-03-05 (×4): 7.5 mg via ORAL
  Filled 2023-02-27 (×4): qty 1

## 2023-02-27 MED ORDER — SODIUM CHLORIDE 0.9% FLUSH: 9.0000 mL | INTRAVENOUS | Status: DC | PRN

## 2023-02-27 MED ORDER — OLANZAPINE 5 MG PO TABS
5.0000 mg | ORAL_TABLET | Freq: Every day | ORAL | Status: DC
  Administered 2023-02-27 – 2023-03-05 (×7): 5 mg via ORAL
  Filled 2023-02-27 (×7): qty 1

## 2023-02-27 MED ORDER — KETOROLAC TROMETHAMINE 15 MG/ML IJ SOLN
15.0000 mg | Freq: Four times a day (QID) | INTRAMUSCULAR | Status: AC
  Administered 2023-02-27 – 2023-03-04 (×20): 15 mg via INTRAVENOUS
  Filled 2023-02-27 (×20): qty 1

## 2023-02-27 MED ORDER — FOLIC ACID 1 MG PO TABS
1.0000 mg | ORAL_TABLET | Freq: Every day | ORAL | Status: DC
Start: 2023-02-28 — End: 2023-03-06
  Administered 2023-02-28 – 2023-03-06 (×7): 1 mg via ORAL
  Filled 2023-02-27 (×7): qty 1

## 2023-02-27 MED ORDER — MORPHINE SULFATE ER 15 MG PO TBCR
15.0000 mg | EXTENDED_RELEASE_TABLET | Freq: Two times a day (BID) | ORAL | Status: DC
  Administered 2023-02-27 – 2023-03-06 (×14): 15 mg via ORAL
  Filled 2023-02-27 (×14): qty 1

## 2023-02-27 MED ORDER — DIPHENHYDRAMINE HCL 25 MG PO CAPS
25.0000 mg | ORAL_CAPSULE | ORAL | Status: DC | PRN
  Administered 2023-02-27 (×2): 25 mg via ORAL
  Filled 2023-02-27 (×2): qty 1

## 2023-02-27 MED ORDER — HYDROMORPHONE 1 MG/ML IV SOLN
INTRAVENOUS | Status: DC
  Administered 2023-02-27: 30 mg via INTRAVENOUS
  Administered 2023-02-27: 9.5 mg via INTRAVENOUS
  Administered 2023-02-27: 14 mg via INTRAVENOUS
  Administered 2023-02-27: 2 mg via INTRAVENOUS
  Administered 2023-02-27: 30 mg via INTRAVENOUS
  Administered 2023-02-28: 2.5 mg via INTRAVENOUS
  Administered 2023-02-28: 9 mg via INTRAVENOUS
  Administered 2023-02-28: 0.5 mg via INTRAVENOUS
  Administered 2023-02-28: 5 mg via INTRAVENOUS
  Administered 2023-02-28: 3.5 mg via INTRAVENOUS
  Administered 2023-02-28: 8 mg via INTRAVENOUS
  Administered 2023-03-01: 3 mg via INTRAVENOUS
  Administered 2023-03-01: 0.5 mg via INTRAVENOUS
  Administered 2023-03-01: 1.5 mg via INTRAVENOUS
  Administered 2023-03-01: 4.5 mg via INTRAVENOUS
  Administered 2023-03-01: 3.5 mg via INTRAVENOUS
  Administered 2023-03-01: 30 mg via INTRAVENOUS
  Administered 2023-03-01: 3.5 mg via INTRAVENOUS
  Administered 2023-03-02: 0.5 mg via INTRAVENOUS
  Administered 2023-03-03: 30 mg via INTRAVENOUS
  Administered 2023-03-03: 4.5 mg via INTRAVENOUS
  Administered 2023-03-04: 1 mg via INTRAVENOUS
  Administered 2023-03-05 (×2): 9 mg via INTRAVENOUS
  Administered 2023-03-05: 3.5 mg via INTRAVENOUS
  Administered 2023-03-05: 30 mg via INTRAVENOUS
  Administered 2023-03-05: 1 mg via INTRAVENOUS
  Administered 2023-03-05: 1.5 mg via INTRAVENOUS
  Administered 2023-03-06 (×2): 4.5 mg via INTRAVENOUS
  Administered 2023-03-06: 30 mg via INTRAVENOUS
  Administered 2023-03-06: 3.5 mg via INTRAVENOUS
  Filled 2023-02-27 (×6): qty 30

## 2023-02-27 MED ORDER — GABAPENTIN 300 MG PO CAPS
300.0000 mg | ORAL_CAPSULE | Freq: Three times a day (TID) | ORAL | Status: DC
  Administered 2023-02-27 – 2023-03-06 (×20): 300 mg via ORAL
  Filled 2023-02-27: qty 1
  Filled 2023-02-27 (×6): qty 3
  Filled 2023-02-27: qty 1
  Filled 2023-02-27 (×10): qty 3
  Filled 2023-02-27: qty 1
  Filled 2023-02-27: qty 3

## 2023-02-27 MED ORDER — CARMEX CLASSIC LIP BALM EX OINT
TOPICAL_OINTMENT | CUTANEOUS | Status: DC | PRN
  Filled 2023-02-27: qty 10

## 2023-02-27 MED ORDER — HYDROXYUREA 500 MG PO CAPS
500.0000 mg | ORAL_CAPSULE | Freq: Two times a day (BID) | ORAL | Status: DC
  Administered 2023-02-27 – 2023-03-06 (×14): 500 mg via ORAL
  Filled 2023-02-27 (×14): qty 1

## 2023-02-27 MED ORDER — POLYETHYLENE GLYCOL 3350 17 G PO PACK
17.0000 g | PACK | Freq: Every day | ORAL | Status: DC | PRN
  Administered 2023-03-06: 17 g via ORAL
  Filled 2023-02-27: qty 1

## 2023-02-27 NOTE — H&P (Signed)
 History and Physical    Patient: Timothy Marks FMW:[id number] DOB: 1990-09-21 DOA: 02/27/2023 DOS: the patient was seen and examined on 02/27/2023 PCP: Jegede, Olugbemiga E, MD  Patient coming from: Home  Chief Complaint: No chief complaint on file.  HPI: Timothy Marks is a 33 y.o. male with medical history significant of sickle cell disease, depression with anxiety, anemia of chronic disease, chronic pain syndrome among other things who presents to the [facility] cell day hospital of complaining of pain rated as 9 out of 10 in his back.  Patient takes Percocet and morphine  and took some today but no relief.  Denied any fever or chills.  Denied any nausea vomiting or diarrhea.  Patient is being admitted for pain management.  Review of Systems: As mentioned in the history of present illness. All other systems reviewed and are negative. 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 His smoking use included cigarettes. He has been exposed to tobacco smoke. He has never used smokeless tobacco. He reports current drug use. Frequency: 7.00 times per week. Drug: Marijuana. He reports that he does not drink alcohol .  Allergies  Allergen Reactions   Buprenorphine Hcl Hives   Levaquin  [Levofloxacin ] Itching   Meperidine Rash   Morphine  Hives   Morphine  And Codeine Hives   Tramadol Hives   Vancomycin Itching   Zosyn [Piperacillin Sod-Tazobactam So] Itching, Rash and Other (See Comments)    Has taken rocephin  in the past    Family History  Problem Relation Age of Onset   Sickle cell trait Mother    Sickle cell trait Father     Prior to Admission medications    Medication Sig Start Date End Date Taking? Authorizing Provider  apixaban  (ELIQUIS ) 5 MG TABS tablet Take 10 mg by mouth 2 (two) times daily.   Yes [provider]  cholecalciferol  (VITAMIN D3) 25 MCG (1000 UNIT) tablet Take 1 tablet (1,000 Units total) by mouth daily. 01/17/23  Yes Paseda, Folashade R, FNP  diphenhydrAMINE  (BENADRYL ) 25 mg capsule Take 1 capsule (25 mg total) by mouth every 6 (six) hours as needed for itching. 01/17/23  Yes Paseda, Folashade R, FNP  folic acid  (FOLVITE ) 1 MG tablet Take 1 tablet (1 mg total) by mouth daily. 07/05/21  Yes Tilford Bertram HERO, FNP  gabapentin  (NEURONTIN ) 300 MG capsule TAKE 1 CAPSULE(300 MG) BY MOUTH THREE TIMES DAILY Patient taking differently: Take 300 mg by mouth 3 (three) times daily. 08/24/21  Yes Jegede, Olugbemiga E, MD  hydroxyurea  (HYDREA ) 500 MG capsule Take 1 capsule (500 mg total) by mouth 2 (two) times daily. May take with food to minimize GI side effects. Patient taking differently: Take 500 mg by mouth 2 (two) times daily. 12/21/21  Yes Tilford Bertram HERO, FNP  hydrOXYzine  (ATARAX ) 25 MG tablet Take 1 tablet (25 mg total) by mouth daily as needed for anxiety or vomiting. 01/17/23  Yes Paseda, Folashade R, FNP  ibuprofen  (ADVIL ) 800 MG tablet Take 800 mg by mouth every 8 (eight) hours as needed.   Yes [provider]  mirtazapine  (REMERON ) 7.5 MG tablet Take 1 tablet (7.5 mg total) by  mouth at bedtime as needed (for sleep). 01/17/23  Yes Paseda, Folashade R, FNP  morphine  (MS CONTIN ) 15 MG 12 hr tablet Take 1 tablet (15 mg total) by mouth every 12 (twelve) hours. 02/01/23 03/03/23 Yes Jegede, Olugbemiga E, MD  OLANZapine  (ZYPREXA ) 5 MG tablet Take 1 tablet (5 mg total) by mouth at bedtime. 05/07/21  Yes Tilford Bertram HERO, FNP  oxyCODONE -acetaminophen  (PERCOCET) 10-325 MG tablet Take 1 tablet by mouth every 6 (six) hours as needed for up to 15 days for pain. 02/22/23 03/09/23 Yes Jegede, Olugbemiga E, MD  cholecalciferol  (VITAMIN D3)  25 MCG (1000 UNIT) tablet Take 1 tablet (1,000 Units total) by mouth daily. 01/20/23   Tilford Bertram HERO, FNP  hydrocortisone  1 % ointment Apply 1 Application topically 2 (two) times daily. Patient taking differently: Apply 1 Application topically 2 (two) times daily as needed for itching. 10/14/21   Jegede, Olugbemiga E, MD  hydrOXYzine  (ATARAX ) 25 MG tablet Take 1 tablet (25 mg total) by mouth daily as needed for anxiety or vomiting. 01/20/23   Tilford Bertram HERO, FNP  mirtazapine  (REMERON ) 7.5 MG tablet Take 1 tablet (7.5 mg total) by mouth at bedtime as needed (for sleep). 01/20/23   Tilford Bertram HERO, FNP  naloxone  (NARCAN ) nasal spray 4 mg/0.1 mL Place 1 spray into the nose once. 09/04/22   [provider]    Physical Exam: Vitals:   02/27/23 0854  BP: 116/62  Pulse: 72  Resp: 16  Temp: 98 F (36.7 C)  TempSrc: Temporal  SpO2: 100%   Constitutional: NAD, calm, comfortable Eyes: PERRL, lids and conjunctivae normal ENMT: Mucous membranes are moist. Posterior pharynx clear of any exudate or lesions.Normal dentition.  Neck: normal, supple, no masses, no thyromegaly Respiratory: clear to auscultation bilaterally, no wheezing, no crackles. Normal respiratory effort. No accessory muscle use.  Cardiovascular: Regular rate and rhythm, no murmurs / rubs / gallops. No extremity edema. 2+ pedal pulses. No carotid bruits.  Abdomen: no tenderness, no masses palpated. No hepatosplenomegaly. Bowel sounds positive.  Musculoskeletal: Good range of motion, no joint swelling or tenderness, Skin: no rashes, lesions, ulcers. No induration Neurologic: CN 2-12 grossly intact. Sensation intact, DTR normal. Strength 5/5 in all 4.  Psychiatric: Normal judgment and insight. Alert and oriented x 3. Normal mood  Data Reviewed:  There are no new results to review at this time.  Assessment and Plan:  #1 sickle cell pain crisis: Patient will be admitted for pain management.  Will initiate Dilaudid  PCA,  Toradol , IV fluids.  Will assess pain throughout the day and when controlled would likely discharge end of the day.  #2 anemia of chronic disease: Continue to monitor his H&H  #3 chronic pain syndrome: Patient will resume chronic home regimen at discharge     Advance Care Planning:   Code Status: Prior full code  Consults: None  Family Communication: No family at bedside  Severity of Illness: The appropriate patient status for this patient is OBSERVATION. Observation status is judged to be reasonable and necessary in order to provide the required intensity of service to ensure the patient's safety. The patient's presenting symptoms, physical exam findings, and initial radiographic and laboratory data in the context of their medical condition is felt to place them at decreased risk for further clinical deterioration. Furthermore, it is anticipated that the patient will be medically stable for discharge from the hospital within 2 midnights of admission.   AuthorBETHA SIM KNOLL, MD 02/27/2023 9:15 AM  For on call review [email or web address].

## 2023-02-27 NOTE — Telephone Encounter (Signed)
 Patient called day hospital wanting to come in today for sickle cell pain treatment. Pt reports 9/10 pain to back. Pt reports taking Perocet and Morphine  ER at 5 AM. Pt denies being to the ER recently. Pt screened for COVID and denies symptoms and exposures. Pt denies fever, chest pain, N/V/D, abdominal pain, and priapism. Dr. Sim notified and per provider pt can come to the day hospital today for treatment. Patient notified and verbalized understanding. Per pt his friend will be his transportation at discharge today.

## 2023-02-27 NOTE — H&P (Signed)
 History and Physical    Patient: Timothy Marks FMW:[id number] DOB: 03-18-1990 DOA: 02/27/2023 DOS: the patient was seen and examined on 02/27/2023 PCP: Jegede, Olugbemiga E, MD  Patient coming from: Home  Chief Complaint: No chief complaint on file.  HPI: Timothy Marks is a 33 y.o. male with medical history significant of sickle cell disease, depression with anxiety, anemia of chronic disease, chronic pain syndrome among other things who presents to the [facility] cell day hospital of complaining of pain rated as 9 out of 10 in his back.  Patient takes Percocet and morphine  and took some today but no relief.  Denied any fever or chills.  Denied any nausea vomiting or diarrhea.  Patient is being was admitted to the patient care center for pain management.  Despite several hours of treatment he continues to have significant pain rated as 10 out of 10.  As a result patient has been admitted to the hospital for continued management.  Review of Systems: As mentioned in the history of present illness. All other systems reviewed and are negative. 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 His smoking use included cigarettes. He has been exposed to tobacco smoke. He has never used smokeless tobacco. He reports current drug use. Frequency: 7.00 times per week. Drug: Marijuana. He reports that he does not drink alcohol .  Allergies  Allergen Reactions   Buprenorphine Hcl Hives   Levaquin  [Levofloxacin ] Itching   Meperidine Rash   Morphine  Hives   Morphine  And Codeine Hives   Tramadol Hives   Vancomycin Itching   Zosyn [Piperacillin Sod-Tazobactam So] Itching, Rash and Other  (See Comments)    Has taken rocephin  in the past    Family History  Problem Relation Age of Onset   Sickle cell trait Mother    Sickle cell trait Father     Prior to Admission medications   Medication Sig Start Date End Date Taking? Authorizing Provider  apixaban  (ELIQUIS ) 5 MG TABS tablet Take 10 mg by mouth 2 (two) times daily.   Yes [provider]  cholecalciferol  (VITAMIN D3) 25 MCG (1000 UNIT) tablet Take 1 tablet (1,000 Units total) by mouth daily. 01/17/23  Yes Paseda, Folashade R, FNP  diphenhydrAMINE  (BENADRYL ) 25 mg capsule Take 1 capsule (25 mg total) by mouth every 6 (six) hours as needed for itching. 01/17/23  Yes Paseda, Folashade R, FNP  folic acid  (FOLVITE ) 1 MG tablet Take 1 tablet (1 mg total) by mouth daily. 07/05/21  Yes Tilford Bertram HERO, FNP  gabapentin  (NEURONTIN ) 300 MG capsule TAKE 1 CAPSULE(300 MG) BY MOUTH THREE TIMES DAILY Patient taking differently: Take 300 mg by mouth 3 (three) times daily. 08/24/21  Yes Jegede, Olugbemiga E, MD  hydroxyurea  (HYDREA ) 500 MG capsule Take 1 capsule (500 mg total) by mouth 2 (two) times daily. May take with food to minimize GI side effects. Patient taking differently: Take 500 mg by mouth 2 (two) times daily. 12/21/21  Yes Tilford Bertram HERO, FNP  hydrOXYzine  (ATARAX ) 25 MG tablet Take 1 tablet (25 mg total) by mouth daily as needed for anxiety or vomiting. 01/17/23  Yes Paseda, Folashade  R, FNP  ibuprofen  (ADVIL ) 800 MG tablet Take 800 mg by mouth every 8 (eight) hours as needed.   Yes [provider]  mirtazapine  (REMERON ) 7.5 MG tablet Take 1 tablet (7.5 mg total) by mouth at bedtime as needed (for sleep). 01/17/23  Yes Paseda, Folashade R, FNP  morphine  (MS CONTIN ) 15 MG 12 hr tablet Take 1 tablet (15 mg total) by mouth every 12 (twelve) hours. 02/01/23 03/03/23 Yes Jegede, Olugbemiga E, MD  OLANZapine  (ZYPREXA ) 5 MG tablet Take 1 tablet (5 mg total) by mouth at bedtime. 05/07/21  Yes Tilford Bertram HERO, FNP   oxyCODONE -acetaminophen  (PERCOCET) 10-325 MG tablet Take 1 tablet by mouth every 6 (six) hours as needed for up to 15 days for pain. 02/22/23 03/09/23 Yes Jegede, Olugbemiga E, MD  cholecalciferol  (VITAMIN D3) 25 MCG (1000 UNIT) tablet Take 1 tablet (1,000 Units total) by mouth daily. 01/20/23   Tilford Bertram HERO, FNP  hydrocortisone  1 % ointment Apply 1 Application topically 2 (two) times daily. Patient taking differently: Apply 1 Application topically 2 (two) times daily as needed for itching. 10/14/21   Jegede, Olugbemiga E, MD  hydrOXYzine  (ATARAX ) 25 MG tablet Take 1 tablet (25 mg total) by mouth daily as needed for anxiety or vomiting. 01/20/23   Tilford Bertram HERO, FNP  mirtazapine  (REMERON ) 7.5 MG tablet Take 1 tablet (7.5 mg total) by mouth at bedtime as needed (for sleep). 01/20/23   Tilford Bertram HERO, FNP  naloxone  (NARCAN ) nasal spray 4 mg/0.1 mL Place 1 spray into the nose once. 09/04/22   [provider]    Physical Exam: Vitals:   02/27/23 1453 02/27/23 1539 02/27/23 1542 02/27/23 1634  BP: 103/60 (!) 111/50  119/75  Pulse: 73   79  Resp: 13 14 12 16   Temp: (!) 97.2 F (36.2 C) 98.2 F (36.8 C)  97.6 F (36.4 C)  TempSrc: Temporal Temporal  Oral  SpO2: 96% 95%  97%   Constitutional: NAD, calm, comfortable Eyes: PERRL, lids and conjunctivae normal ENMT: Mucous membranes are moist. Posterior pharynx clear of any exudate or lesions.Normal dentition.  Neck: normal, supple, no masses, no thyromegaly Respiratory: clear to auscultation bilaterally, no wheezing, no crackles. Normal respiratory effort. No accessory muscle use.  Cardiovascular: Regular rate and rhythm, no murmurs / rubs / gallops. No extremity edema. 2+ pedal pulses. No carotid bruits.  Abdomen: no tenderness, no masses palpated. No hepatosplenomegaly. Bowel sounds positive.  Musculoskeletal: Good range of motion, no joint swelling or tenderness, Skin: no rashes, lesions, ulcers. No induration Neurologic: CN  2-12 grossly intact. Sensation intact, DTR normal. Strength 5/5 in all 4.  Psychiatric: Normal judgment and insight. Alert and oriented x 3. Normal mood  Data Reviewed:  There are no new results to review at this time.  Assessment and Plan:  #1 sickle cell pain crisis: Patient will be admitted for pain management.  Will initiate Dilaudid  PCA, Toradol , IV fluids.  Will assess pain throughout the day and when controlled would likely discharge end of the day.  #2 anemia of chronic disease: Continue to monitor his H&H  #3 chronic pain syndrome: Patient will resume chronic home regimen at discharge     Advance Care Planning:   Code Status: Full Code full code  Consults: None  Family Communication: No family at bedside  Severity of Illness: The appropriate patient status for this patient is OBSERVATION. Observation status is judged to be reasonable and necessary in order to provide the required intensity of service to  ensure the patient's safety. The patient's presenting symptoms, physical exam findings, and initial radiographic and laboratory data in the context of their medical condition is felt to place them at decreased risk for further clinical deterioration. Furthermore, it is anticipated that the patient will be medically stable for discharge from the hospital within 2 midnights of admission.   AuthorBETHA SIM KNOLL, MD 02/27/2023 4:57 PM  For on call review [email or web address].

## 2023-02-27 NOTE — Progress Notes (Signed)
 Pt admitted to the day hospital today for sickle cell pain treatment. On arrival, pt rates 9/10 pain to his back and bilateral legs. Pt received Dilaudid  PCA, IV Toradol , IV Zofran , and hydrated with IV Fluids via PIV. Pt also received PO Benadryl . Due to ongoing pain, pt is being admitted to inpatient by Dr. Sim. Report given to Devere, RN. PCA settings verified with Rosina HERO, RN. At time of transfer, pt rates pain 9/10. PCA and IV fluids remain infusing at transfer. Pt is alert, oriented, and ambulatory at transfer. Pt is transferred to [address], bed 5 in wheelchair, by RN.

## 2023-02-28 DIAGNOSIS — D5700 Hb-SS disease with crisis, unspecified: Secondary | ICD-10-CM | POA: Diagnosis not present

## 2023-02-28 LAB — RETIC PANEL
Immature Retic Fract: 27.1 % — ABNORMAL HIGH (ref 2.3–15.9)
RBC.: 2.9 MIL/uL — ABNORMAL LOW (ref 4.22–5.81)
Retic Count, Absolute: 352.5 10*3/uL — ABNORMAL HIGH (ref 19.0–186.0)
Retic Ct Pct: 12.2 % — ABNORMAL HIGH (ref 0.4–3.1)
Reticulocyte Hemoglobin: 27.3 pg — ABNORMAL LOW (ref >27.9)

## 2023-02-28 MED ORDER — PHENOL 1.4 % MT LIQD
1.0000 | OROMUCOSAL | Status: DC | PRN
  Administered 2023-02-28: 1 via OROMUCOSAL
  Filled 2023-02-28: qty 177

## 2023-02-28 MED ORDER — ENOXAPARIN SODIUM 40 MG/0.4ML IJ SOSY: 40.0000 mg | PREFILLED_SYRINGE | INTRAMUSCULAR | Status: DC

## 2023-02-28 NOTE — Progress Notes (Signed)
 SICKLE CELL SERVICE PROGRESS NOTE  Timothy Marks FMW:[id number] DOB: [birth year] DOA: 02/27/2023 PCP: Jegede, Olugbemiga E, MD  Assessment/Plan: Principal Problem:   Sickle cell anemia with crisis (HCC) Active Problems:   Sickle cell pain crisis (HCC)   Aggressive behavior   Hyperbilirubinemia   Protein-calorie malnutrition, severe (HCC)  Sickle Cell Pain Crisis: Continue Dilaudid  PCA, Toradol  and IVF. Continue to assess pain. Anemia of Chronic Disease: H/H is stable at 10.2. Chronic Pain Syndrome: Continue chronic home medications Hypokalemia: Continue to replete potassium Aggressive behavior: Chronic.  Patient reoriented  Code Status: Full code Family Communication: No family at bedside Disposition Plan: Home when ready  [facility]  Pager [phone]. If 7PM-7AM, please contact night-coverage.  02/28/2023, 5:09 PM  LOS: 1 day   Brief narrative: Timothy Marks is a 33 y.o. male with medical history significant of sickle cell disease, depression with anxiety, anemia of chronic disease, chronic pain syndrome among other things who presents to the sickle cell day hospital of complaining of pain rated as 9 out of 10 in his back.  Patient takes Percocet and morphine  and took some today but no relief.  Denied any fever or chills.  Denied any nausea vomiting or diarrhea.  Patient is being was admitted to the patient care center for pain management.  Despite several hours of treatment he continues to have significant pain rated as 10 out of 10.  As a result patient has been admitted to the hospital for continued management.   Consultants: None  Procedures: Chest x-ray  Antibiotics: None  HPI/Subjective: Patient still complaining of 9 out of 10 pain.  He has been disruptive to the staff.  All night long did not sleep.  Objective: Vitals:   02/28/23 0934 02/28/23 1249 02/28/23 1348 02/28/23 1648  BP: 120/61  133/65   Pulse: 70  74   Resp: 18 18 16 16   Temp: 99.4 F (37.4  C)  98.8 F (37.1 C)   TempSrc: Oral  Oral   SpO2: 100%  92%    Weight change:   Intake/Output Summary (Last 24 hours) at 02/28/2023 1709 Last data filed at 02/28/2023 1400 Gross per 24 hour  Intake 2606.14 ml  Output 1625 ml  Net 981.14 ml    General: Alert, awake, oriented x3, in no acute distress.  HEENT: [place]/AT PEERL, EOMI Neck: Trachea midline,  no masses, no thyromegal,y no JVD, no carotid bruit OROPHARYNX:  Moist, No exudate/ erythema/lesions.  Heart: Regular rate and rhythm, without murmurs, rubs, gallops, PMI non-displaced, no heaves or thrills on palpation.  Lungs: Clear to auscultation, no wheezing or rhonchi noted. No increased vocal fremitus resonant to percussion  Abdomen: Soft, nontender, nondistended, positive bowel sounds, no masses no hepatosplenomegaly noted..  Neuro: No focal neurological deficits noted cranial nerves II through XII grossly intact. DTRs 2+ bilaterally upper and lower extremities. Strength 5 out of 5 in bilateral upper and lower extremities. Musculoskeletal: No warm swelling or erythema around joints, no spinal tenderness noted. Psychiatric: Patient alert and oriented x3, good insight and cognition, good recent to remote recall. Lymph node survey: No cervical axillary or inguinal lymphadenopathy noted.   Data Reviewed: Basic Metabolic Panel: Recent Labs  Lab 02/27/23 0950  NA 135  K 3.4*  CL 101  CO2 26  GLUCOSE 92  BUN 9  CREATININE 0.57*  CALCIUM 8.5*   Liver Function Tests: Recent Labs  Lab 02/27/23 0950  AST 24  ALT 11  ALKPHOS 58  BILITOT 2.1*  PROT  7.3  ALBUMIN 4.0   No results for input(s): LIPASE, AMYLASE in the last 168 hours. No results for input(s): AMMONIA in the last 168 hours. CBC: Recent Labs  Lab 02/27/23 0950  WBC 8.4  NEUTROABS 5.1  HGB 10.2*  HCT 30.5*  MCV 89.2  PLT 425*   Cardiac Enzymes: No results for input(s): CKTOTAL, CKMB, CKMBINDEX, TROPONINI in the last 168 hours. BNP (last  3 results) No results for input(s): BNP in the last 8760 hours.  ProBNP (last 3 results) No results for input(s): PROBNP in the last 8760 hours.  CBG: No results for input(s): GLUCAP in the last 168 hours.  No results found for this or any previous visit (from the past 240 hours).   Studies: No results found.  Scheduled Meds:  apixaban   5 mg Oral BID   folic acid   1 mg Oral Daily   gabapentin   300 mg Oral TID   HYDROmorphone    Intravenous Q4H   hydroxyurea   500 mg Oral BID   ketorolac   15 mg Intravenous Q6H   morphine   15 mg Oral Q12H   OLANZapine   5 mg Oral QHS   senna-docusate  1 tablet Oral BID   Continuous Infusions:  Principal Problem:   Sickle cell anemia with crisis (HCC) Active Problems:   Sickle cell pain crisis (HCC)   Aggressive behavior   Hyperbilirubinemia   Protein-calorie malnutrition, severe (HCC)

## 2023-02-28 NOTE — Progress Notes (Signed)
    02/28/23 2051  Assess: MEWS Score  Temp (!) 100.7 F (38.2 C)  BP 107/62  MAP (mmHg) 76  Pulse Rate (!) 122  Resp 14  SpO2 93 %  O2 Device Nasal Cannula  O2 Flow Rate (L/min) 2 L/min  Assess: MEWS Score  MEWS Temp 1  MEWS Systolic 0  MEWS Pulse 2  MEWS RR 0  MEWS LOC 0  MEWS Score 3  MEWS Score Color Yellow  Assess: if the MEWS score is Yellow or Red  Were vital signs accurate and taken at a resting state? Yes  Does the patient meet 2 or more of the SIRS criteria? No  MEWS guidelines implemented  Yes, yellow  Treat  MEWS Interventions Considered administering scheduled or prn medications/treatments as ordered  Take Vital Signs  Increase Vital Sign Frequency  Yellow: Q2hr x1, continue Q4hrs until patient remains green for 12hrs  Escalate  MEWS: Escalate Yellow: Discuss with charge nurse and consider notifying provider and/or RRT  Notify: Charge Nurse/RN  Name of Charge Nurse/RN Notified Darika Ildefonso, RN  Assess: SIRS CRITERIA  SIRS Temperature  0  SIRS Respirations  0  SIRS Pulse 1  SIRS WBC 0  SIRS Score Sum  1

## 2023-02-28 NOTE — Progress Notes (Signed)
   02/28/23 1141  TOC Brief Assessment  Insurance and Status Reviewed  Patient has primary care physician Yes  Home environment has been reviewed home alone  Prior level of function: independent  Prior/Current Home Services No current home services  Social Drivers of Health Review SDOH reviewed no interventions necessary  Readmission risk has been reviewed Yes  Transition of care needs no transition of care needs at this time

## 2023-02-28 NOTE — Plan of Care (Signed)
  Problem: Self-Care: Goal: Ability to incorporate actions that prevent/reduce pain crisis will improve Outcome: Progressing

## 2023-02-28 NOTE — Progress Notes (Deleted)
   02/28/23 1141  TOC Brief Assessment  Insurance and Status Reviewed  Patient has primary care physician Yes  Home environment has been reviewed home alone  Prior level of function: independent  Prior/Current Home Services No current home services  Social Drivers of Health Review SDOH reviewed no interventions necessary  Readmission risk has been reviewed Yes  Transition of care needs no transition of care needs at this time

## 2023-03-01 DIAGNOSIS — D5700 Hb-SS disease with crisis, unspecified: Secondary | ICD-10-CM | POA: Diagnosis not present

## 2023-03-01 MED ORDER — OXYCODONE HCL 5 MG PO TABS
5.0000 mg | ORAL_TABLET | Freq: Four times a day (QID) | ORAL | Status: DC | PRN
  Administered 2023-03-01 – 2023-03-03 (×3): 5 mg via ORAL
  Filled 2023-03-01 (×3): qty 1

## 2023-03-01 MED ORDER — OXYCODONE-ACETAMINOPHEN 10-325 MG PO TABS: 1.0000 | ORAL_TABLET | Freq: Four times a day (QID) | ORAL | Status: DC | PRN

## 2023-03-01 MED ORDER — OXYCODONE-ACETAMINOPHEN 5-325 MG PO TABS
1.0000 | ORAL_TABLET | Freq: Four times a day (QID) | ORAL | Status: DC | PRN
  Administered 2023-03-01 – 2023-03-03 (×4): 1 via ORAL
  Filled 2023-03-01 (×4): qty 1

## 2023-03-01 NOTE — Progress Notes (Signed)
 Patient's IV pump keeps alarming from his IV getting occluded and his PCA where his nasal cannula to measure his respiration keeps coming off.Patient has been educated and instructed repeatedly.

## 2023-03-01 NOTE — Progress Notes (Signed)
 SICKLE CELL SERVICE PROGRESS NOTE  Timothy Marks:[id number] DOB: 05-Sep-1990 DOA: 02/27/2023 PCP: Jegede, Olugbemiga E, MD  Assessment/Plan: Principal Problem:   Sickle cell anemia with crisis (HCC) Active Problems:   Sickle cell pain crisis (HCC)   Aggressive behavior   Hyperbilirubinemia   Protein-calorie malnutrition, severe (HCC)  Sickle Cell Pain Crisis: Patient is doing better but still pain is 8 out of 10.  Continue Dilaudid  PCA, Toradol  and IVF. Continue to assess pain. Anemia of Chronic Disease: H/H is stable at 10.2. Chronic Pain Syndrome: Continue chronic home medications Hypokalemia: Continue to replete potassium Aggressive behavior: Chronic.  Patient reoriented Disposition: Patient will be discharged home when ready.  He seems to be doing better.  Restart his oral home medication  Code Status: Full code Family Communication: No family at bedside Disposition Plan: Home when ready  [facility]  Pager [phone]. If 7PM-7AM, please contact night-coverage.  03/01/2023, 6:44 PM  LOS: 2 days   Brief narrative: Timothy Marks is a 33 y.o. male with medical history significant of sickle cell disease, depression with anxiety, anemia of chronic disease, chronic pain syndrome among other things who presents [facility] the [facility] of complaining of pain rated as 9 out of 10 in his back.  Patient takes Percocet and morphine  and took some today but no relief.  Denied any fever or chills.  Denied any nausea vomiting or diarrhea.  Patient is being was admitted to the patient care center for pain management.  Despite several hours of treatment he continues to have significant pain rated as 10 out of 10.  As a result patient has been admitted to the hospital for continued management.   Consultants: None  Procedures: Chest x-ray  Antibiotics: None  HPI/Subjective: Patient still complaining of 7 out of 10 pain.  He has been less disruptive and doing better.  No  fever no chills no nausea vomiting or diarrhea  Objective: Vitals:   03/01/23 1200 03/01/23 1401 03/01/23 1600 03/01/23 1808  BP:  112/68  122/78  Pulse:  (!) 102  74  Resp: 18 18 18 16   Temp:  99.1 F (37.3 C)  98.6 F (37 C)  TempSrc:  Oral  Oral  SpO2:  100%  100%   Weight change:   Intake/Output Summary (Last 24 hours) at 03/01/2023 1844 Last data filed at 03/01/2023 1800 Gross per 24 hour  Intake 1094 ml  Output 3100 ml  Net -2006 ml    General: Alert, awake, oriented x3, in no acute distress.  HEENT: [place]/AT PEERL, EOMI Neck: Trachea midline,  no masses, no thyromegal,y no JVD, no carotid bruit OROPHARYNX:  Moist, No exudate/ erythema/lesions.  Heart: Regular rate and rhythm, without murmurs, rubs, gallops, PMI non-displaced, no heaves or thrills on palpation.  Lungs: Clear to auscultation, no wheezing or rhonchi noted. No increased vocal fremitus resonant to percussion  Abdomen: Soft, nontender, nondistended, positive bowel sounds, no masses no hepatosplenomegaly noted..  Neuro: No focal neurological deficits noted cranial nerves II through XII grossly intact. DTRs 2+ bilaterally upper and lower extremities. Strength 5 out of 5 in bilateral upper and lower extremities. Musculoskeletal: No warm swelling or erythema around joints, no spinal tenderness noted. Psychiatric: Patient alert and oriented x3, good insight and cognition, good recent to remote recall. Lymph node survey: No cervical axillary or inguinal lymphadenopathy noted.   Data Reviewed: Basic Metabolic Panel: Recent Labs  Lab 02/27/23 0950  NA 135  K 3.4*  CL 101  CO2 26  GLUCOSE 92  BUN 9  CREATININE 0.57*  CALCIUM 8.5*   Liver Function Tests: Recent Labs  Lab 02/27/23 0950  AST 24  ALT 11  ALKPHOS 58  BILITOT 2.1*  PROT 7.3  ALBUMIN 4.0   No results for input(s): "LIPASE", "AMYLASE" in the last 168 hours. No results for input(s): "AMMONIA" in the last 168 hours. CBC: Recent Labs  Lab  02/27/23 0950  WBC 8.4  NEUTROABS 5.1  HGB 10.2*  HCT 30.5*  MCV 89.2  PLT 425*   Cardiac Enzymes: No results for input(s): "CKTOTAL", "CKMB", "CKMBINDEX", "TROPONINI" in the last 168 hours. BNP (last 3 results) No results for input(s): "BNP" in the last 8760 hours.  ProBNP (last 3 results) No results for input(s): "PROBNP" in the last 8760 hours.  CBG: No results for input(s): "GLUCAP" in the last 168 hours.  No results found for this or any previous visit (from the past 240 hours).   Studies: No results found.  Scheduled Meds:  apixaban   5 mg Oral BID   folic acid   1 mg Oral Daily   gabapentin   300 mg Oral TID   HYDROmorphone    Intravenous Q4H   hydroxyurea   500 mg Oral BID   ketorolac   15 mg Intravenous Q6H   morphine   15 mg Oral Q12H   OLANZapine   5 mg Oral QHS   senna-docusate  1 tablet Oral BID   Continuous Infusions:  Principal Problem:   Sickle cell anemia with crisis (HCC) Active Problems:   Sickle cell pain crisis (HCC)   Aggressive behavior   Hyperbilirubinemia   Protein-calorie malnutrition, severe (HCC)

## 2023-03-01 NOTE — Progress Notes (Signed)
 NT called in to the nurse station reported that pt is not being cooperative while taking his vital signs ( per NT pt keeps falling asleep while taking oral temp ), upon entering the room pt was angry and cursing towards NT. Patient was instructed to to talk properly and nicely with staff and that behavior is not acceptable. NT feels uncomfortable going inside pt's room.

## 2023-03-02 ENCOUNTER — Encounter (HOSPITAL_COMMUNITY): Payer: Self-pay | Admitting: Internal Medicine

## 2023-03-02 DIAGNOSIS — D5700 Hb-SS disease with crisis, unspecified: Secondary | ICD-10-CM | POA: Diagnosis not present

## 2023-03-02 MED ORDER — ACETAMINOPHEN 325 MG PO TABS
650.0000 mg | ORAL_TABLET | Freq: Four times a day (QID) | ORAL | Status: AC | PRN
  Administered 2023-03-02 – 2023-03-04 (×2): 650 mg via ORAL
  Filled 2023-03-02 (×2): qty 2

## 2023-03-02 NOTE — Progress Notes (Signed)
SICKLE CELL SERVICE PROGRESS NOTE  Timothy Marks WUJ:[id number] DOB: 1990/04/04 DOA: 02/27/2023 PCP: Quentin Angst, MD  Assessment/Plan: Principal Problem:   Sickle cell anemia with crisis (HCC) Active Problems:   Sickle cell pain crisis (HCC)   Aggressive behavior   Hyperbilirubinemia   Protein-calorie malnutrition, severe (HCC)  Sickle Cell Pain Crisis: Patient is doing better but still pain is 7 out of 10.  Continue Dilaudid PCA, Toradol and IVF.  Patient also on oral medications both long-acting and short acting.  Continue to assess pain. Anemia of Chronic Disease: H/H is stable at baseline Chronic Pain Syndrome: Continue chronic home medications Hypokalemia: Continue to replete potassium Aggressive behavior: Chronic.  Patient reoriented Disposition: Patient will be discharged home when ready.  He seems to be doing better.  Slowly getting better hopefully within the next 48 hours he can be discharged  Code Status: Full code Family Communication: No family at bedside Disposition Plan: Home when ready  [name]  Pager [phone]. If 7PM-7AM, please contact night-coverage.  03/02/2023, 6:07 AM  LOS: 3 days   Brief narrative: Timothy Marks is a 33 y.o. male with medical history significant of sickle cell disease, depression with anxiety, anemia of chronic disease, chronic pain syndrome among other things who presents [facility] the [facility] cell day hospital of complaining of pain rated as 9 out of 10 in his back.  Patient takes Percocet and morphine and took some today but no relief.  Denied any fever or chills.  Denied any nausea vomiting or diarrhea.  Patient is being was admitted to the patient care center for pain management.  Despite several hours of treatment he continues to have significant pain rated as 10 out of 10.  As a result patient has been admitted to the hospital for continued management.   Consultants: None  Procedures: Chest  x-ray  Antibiotics: None  HPI/Subjective: Patient still complaining of 7 out of 10 pain.  He has been less disruptive and doing better.  No fever no chills no nausea vomiting or diarrhea.  Pain is localized to his right hip.  It appears to be possible bone infarcts.  Objective: Vitals:   03/01/23 2057 03/01/23 2306 03/02/23 0328 03/02/23 0415  BP: 117/77   107/61  Pulse: (!) 105   (!) 115  Resp: 14 14 14 16   Temp: 99.2 F (37.3 C)   100 F (37.8 C)  TempSrc: Oral   Oral  SpO2: 93% 94%  97%   Weight change:   Intake/Output Summary (Last 24 hours) at 03/02/2023 7846 Last data filed at 03/02/2023 0419 Gross per 24 hour  Intake 1320 ml  Output 2150 ml  Net -830 ml    General: Alert, awake, oriented x3, in no acute distress.  HEENT: [place]/AT PEERL, EOMI Neck: Trachea midline,  no masses, no thyromegal,y no JVD, no carotid bruit OROPHARYNX:  Moist, No exudate/ erythema/lesions.  Heart: Regular rate and rhythm, without murmurs, rubs, gallops, PMI non-displaced, no heaves or thrills on palpation.  Lungs: Clear to auscultation, no wheezing or rhonchi noted. No increased vocal fremitus resonant to percussion  Abdomen: Soft, nontender, nondistended, positive bowel sounds, no masses no hepatosplenomegaly noted..  Neuro: No focal neurological deficits noted cranial nerves II through XII grossly intact. DTRs 2+ bilaterally upper and lower extremities. Strength 5 out of 5 in bilateral upper and lower extremities. Musculoskeletal: No warm swelling or erythema around joints, no spinal tenderness noted. Psychiatric: Patient alert and oriented x3, good insight and cognition, good  recent to remote recall. Lymph node survey: No cervical axillary or inguinal lymphadenopathy noted.   Data Reviewed: Basic Metabolic Panel: Recent Labs  Lab 02/27/23 0950  NA 135  K 3.4*  CL 101  CO2 26  GLUCOSE 92  BUN 9  CREATININE 0.57*  CALCIUM 8.5*   Liver Function Tests: Recent Labs  Lab  02/27/23 0950  AST 24  ALT 11  ALKPHOS 58  BILITOT 2.1*  PROT 7.3  ALBUMIN 4.0   No results for input(s): "LIPASE", "AMYLASE" in the last 168 hours. No results for input(s): "AMMONIA" in the last 168 hours. CBC: Recent Labs  Lab 02/27/23 0950  WBC 8.4  NEUTROABS 5.1  HGB 10.2*  HCT 30.5*  MCV 89.2  PLT 425*   Cardiac Enzymes: No results for input(s): "CKTOTAL", "CKMB", "CKMBINDEX", "TROPONINI" in the last 168 hours. BNP (last 3 results) No results for input(s): "BNP" in the last 8760 hours.  ProBNP (last 3 results) No results for input(s): "PROBNP" in the last 8760 hours.  CBG: No results for input(s): "GLUCAP" in the last 168 hours.  No results found for this or any previous visit (from the past 240 hours).   Studies: No results found.  Scheduled Meds:  apixaban  5 mg Oral BID   folic acid  1 mg Oral Daily   gabapentin  300 mg Oral TID   HYDROmorphone   Intravenous Q4H   hydroxyurea  500 mg Oral BID   ketorolac  15 mg Intravenous Q6H   morphine  15 mg Oral Q12H   OLANZapine  5 mg Oral QHS   senna-docusate  1 tablet Oral BID   Continuous Infusions:  Principal Problem:   Sickle cell anemia with crisis (HCC) Active Problems:   Sickle cell pain crisis (HCC)   Aggressive behavior   Hyperbilirubinemia   Protein-calorie malnutrition, severe (HCC)

## 2023-03-02 NOTE — Progress Notes (Signed)
MEWS Progress Note  Patient Details Name: Timothy Marks MRN: [id number] DOB: November 15, 1990 Today's Date: 03/02/2023   MEWS Flowsheet Documentation:  Assess: MEWS Score Temp: (!) 101.7 F (38.7 C) BP: 114/69 MAP (mmHg): 82 Pulse Rate: (!) 108 Resp: 19 Level of Consciousness: Alert SpO2: 94 % O2 Device: Room Air O2 Flow Rate (L/min): 0 L/min FiO2 (%): (!) 0 % Assess: MEWS Score MEWS Temp: 2 MEWS Systolic: 0 MEWS Pulse: 1 MEWS RR: 0 MEWS LOC: 0 MEWS Score: 3 MEWS Score Color: Yellow Assess: SIRS CRITERIA SIRS Temperature : 1 SIRS Respirations : 0 SIRS Pulse: 1 SIRS WBC: 0 SIRS Score Sum : 2 SIRS Temperature : 1 SIRS Pulse: 1 SIRS Respirations : 0 SIRS WBC: 0 SIRS Score Sum : 2 Assess: if the MEWS score is Yellow or Red Were vital signs accurate and taken at a resting state?: Yes Does the patient meet 2 or more of the SIRS criteria?: No MEWS guidelines implemented : No, previously yellow, continue vital signs every 4 hours Notify: Charge Nurse/RN Name of Charge Nurse/RN Notified: Bishnu, RN Provider Notification Provider Name/Title: Marney Setting Date Provider Notified: 03/02/23 Time Provider Notified: 2145 Method of Notification: Page Notification Reason: Change in status ([place]) Provider response: Evaluate remotely Date of Provider Response: 03/02/23 Time of Provider Response: 2150      Zane Herald B 03/02/2023, 10:05 PM

## 2023-03-03 DIAGNOSIS — D5700 Hb-SS disease with crisis, unspecified: Secondary | ICD-10-CM | POA: Diagnosis not present

## 2023-03-03 LAB — CBC WITH DIFFERENTIAL/PLATELET
Abs Immature Granulocytes: 0.11 10*3/uL — ABNORMAL HIGH (ref 0.00–0.07)
Basophils Absolute: 0 10*3/uL (ref 0.0–0.1)
Basophils Relative: 0 %
Eosinophils Absolute: 0.3 10*3/uL (ref 0.0–0.5)
Eosinophils Relative: 2 %
HCT: 18.5 % — ABNORMAL LOW (ref 39.0–52.0)
Hemoglobin: 6.4 g/dL — CL (ref 13.0–17.0)
Immature Granulocytes: 1 %
Lymphocytes Relative: 20 %
Lymphs Abs: 3.1 10*3/uL (ref 0.7–4.0)
MCH: 30.2 pg (ref 26.0–34.0)
MCHC: 34.6 g/dL (ref 30.0–36.0)
MCV: 87.3 fL (ref 80.0–100.0)
Monocytes Absolute: 1.2 10*3/uL — ABNORMAL HIGH (ref 0.1–1.0)
Monocytes Relative: 8 %
Neutro Abs: 11.1 10*3/uL — ABNORMAL HIGH (ref 1.7–7.7)
Neutrophils Relative %: 69 %
Platelets: 294 10*3/uL (ref 150–400)
RBC: 2.12 MIL/uL — ABNORMAL LOW (ref 4.22–5.81)
RDW: 19.8 % — ABNORMAL HIGH (ref 11.5–15.5)
WBC: 16 10*3/uL — ABNORMAL HIGH (ref 4.0–10.5)
nRBC: 1.8 % — ABNORMAL HIGH (ref 0.0–0.2)

## 2023-03-03 LAB — COMPREHENSIVE METABOLIC PANEL
ALT: 28 U/L (ref 0–44)
AST: 47 U/L — ABNORMAL HIGH (ref 15–41)
Albumin: 3.6 g/dL (ref 3.5–5.0)
Alkaline Phosphatase: 59 U/L (ref 38–126)
Anion gap: 10 (ref 5–15)
BUN: 12 mg/dL (ref 6–20)
CO2: 22 mmol/L (ref 22–32)
Calcium: 8.5 mg/dL — ABNORMAL LOW (ref 8.9–10.3)
Chloride: 106 mmol/L (ref 98–111)
Creatinine, Ser: 0.49 mg/dL — ABNORMAL LOW (ref 0.61–1.24)
GFR, Estimated: >60 mL/min (ref >60)
Glucose, Bld: 102 mg/dL — ABNORMAL HIGH (ref 70–99)
Potassium: 4.7 mmol/L (ref 3.5–5.1)
Sodium: 138 mmol/L (ref 135–145)
Total Bilirubin: 3.5 mg/dL — ABNORMAL HIGH (ref 0.0–1.2)
Total Protein: 7 g/dL (ref 6.5–8.1)

## 2023-03-03 LAB — LACTIC ACID, PLASMA: Lactic Acid, Venous: 0.7 mmol/L (ref 0.5–1.9)

## 2023-03-03 LAB — PREPARE RBC (CROSSMATCH)

## 2023-03-03 MED ORDER — SODIUM CHLORIDE 0.9% IV SOLUTION: Freq: Once | INTRAVENOUS | Status: DC

## 2023-03-03 MED ORDER — OXYCODONE HCL 5 MG PO TABS: 5.0000 mg | ORAL_TABLET | Freq: Four times a day (QID) | ORAL | Status: DC | PRN

## 2023-03-03 MED ORDER — OXYCODONE HCL 5 MG PO TABS
5.0000 mg | ORAL_TABLET | Freq: Four times a day (QID) | ORAL | Status: DC | PRN
  Filled 2023-03-03: qty 1

## 2023-03-03 MED ORDER — ENSURE ENLIVE PO LIQD
237.0000 mL | Freq: Three times a day (TID) | ORAL | Status: DC
Start: 2023-03-04 — End: 2023-03-06
  Administered 2023-03-03 – 2023-03-06 (×8): 237 mL via ORAL

## 2023-03-03 MED ORDER — OXYCODONE-ACETAMINOPHEN 5-325 MG PO TABS
2.0000 | ORAL_TABLET | ORAL | Status: DC | PRN
  Administered 2023-03-03 – 2023-03-04 (×2): 2 via ORAL
  Filled 2023-03-03 (×3): qty 2

## 2023-03-03 MED ORDER — OXYCODONE-ACETAMINOPHEN 5-325 MG PO TABS: 2.0000 | ORAL_TABLET | Freq: Four times a day (QID) | ORAL | Status: DC | PRN

## 2023-03-03 NOTE — Progress Notes (Signed)
   03/03/23 0049  Provider Notification  Provider Name/Title Chavez,NP  Date Provider Notified 03/03/23  Time Provider Notified 0050  Method of Notification Page (via secure chat, providers preference)  Notification Reason Critical Result  Test performed and critical result HBG 6.4  Date Critical Result Received 03/03/23  Time Critical Result Received 0040  Provider response Evaluate remotely

## 2023-03-03 NOTE — Progress Notes (Signed)
Pt requested his night medications now, as he stated he didn't want to be "bothered" the rest of the night. Nurse explained to him that nurse has duties to do, as it comes to his care and that she would be in and out to address those things, such as checking his PCA machine. Nurse had to go back in to check things on PCA machine. He then states, "how many times yall gotta come in here". Nurse stated as many time as necessary, as we, the nurses, have things we have to do, to make sure he gets the care that he needs. Nurse explained that she will be in every 4 hours to check his PCA machine accordingly.

## 2023-03-03 NOTE — Progress Notes (Addendum)
Patient is currently scream out and crying stating, " Oh god, Oh  god,  I am in pain". When asked where the pain is states, " my arms".  This Clinical research associate as safely given patient everything that he can have for pain at this time. IV team in the room to start a second IV site for blood administration. Patient continues to cry out and scream about being pain. Plan of care ongoing.

## 2023-03-03 NOTE — Progress Notes (Signed)
Lab made RN aware that patient is refusing all lab work at this time, Charity fundraiser provided patient with education regarding importance of lab work, patient continues to refuse, MD made aware.

## 2023-03-03 NOTE — Progress Notes (Signed)
Messaged Dr. Mikeal Hawthorne in regards to the strawberry ensure request. Verbal orders received. Informed pt of this.

## 2023-03-03 NOTE — Progress Notes (Addendum)
Pt requested strawberry ensure. Nurse shared with him that it is considered a medication and that there is no order for this. Nurse will reach out to provider to see if they are willing to order this for him.

## 2023-03-03 NOTE — Progress Notes (Signed)
Patient continues to be noncompliant with keeping his 02 senor in his nose. He is taking it out of his nose and resting it on the bridge of this nose. The PCA machine continuously alarms.  Patient gets upset when you awaken him to put the sensor back in. This Clinical research associate has been in the patient's room countless times during this shift for this reason.

## 2023-03-03 NOTE — Progress Notes (Signed)
SICKLE CELL SERVICE PROGRESS NOTE  Timothy Marks:[id number] DOB: 06-22-1990 DOA: 02/27/2023 PCP: Quentin Angst, MD  Assessment/Plan: Principal Problem:   Sickle cell anemia with crisis (HCC) Active Problems:   Sickle cell pain crisis (HCC)   Aggressive behavior   Hyperbilirubinemia   Protein-calorie malnutrition, severe (HCC)  Sickle Cell Pain Crisis: Patient is doing better but still pain is 8 out of 10.  Continue Dilaudid PCA, Toradol and IVF.  Patient also on oral medications both long-acting and short acting.  Continue to assess pain. Anemia of Chronic Disease: Hb has dropped down to 6.4 and patient being transfused 1 unit PRBC. H/H is stable at baseline Chronic Pain Syndrome: Continue chronic home medications Hypokalemia: Continue to replete potassium Aggressive behavior: Chronic.  Patient reoriented Disposition: Patient will be discharged home when ready.  He seems to be doing better.  Slowly getting better hopefully within the next 48 hours he can be discharged  Code Status: Full code Family Communication: No family at bedside Disposition Plan: Home when ready  [facility]  Pager [phone]. If 7PM-7AM, please contact night-coverage.  03/03/2023, 7:31 AM  LOS: 4 days   Brief narrative: Timothy Marks is a 33 y.o. male with medical history significant of sickle cell disease, depression with anxiety, anemia of chronic disease, chronic pain syndrome among other things who presents to the sickle cell day hospital of complaining of pain rated as 9 out of 10 in his back.  Patient takes Percocet and morphine and took some today but no relief.  Denied any fever or chills.  Denied any nausea vomiting or diarrhea.  Patient is being was admitted to the patient care center for pain management.  Despite several hours of treatment he continues to have significant pain rated as 10 out of 10.  As a result patient has been admitted to the hospital for continued management.    Consultants: None  Procedures: Chest x-ray  Antibiotics: None  HPI/Subjective: Patient still complaining of 8 out of 10 pain.  He has been less disruptive and doing better.  No fever no chills no nausea vomiting or diarrhea.  Pain is localized to his right hip.  It appears to be possible bone infarcts.  Objective: Vitals:   03/03/23 0408 03/03/23 0440 03/03/23 0515 03/03/23 0643  BP:  112/72 (!) 92/54   Pulse:  87 86   Resp: 14 14 14 16   Temp:  98.8 F (37.1 C) 97.8 F (36.6 C)   TempSrc:  Oral Oral   SpO2:  97% 99% 98%  Weight:      Height:       Weight change:   Intake/Output Summary (Last 24 hours) at 03/03/2023 0731 Last data filed at 03/03/2023 0600 Gross per 24 hour  Intake 300 ml  Output 1800 ml  Net -1500 ml    General: Alert, awake, oriented x3, in no acute distress.  HEENT: [place]/AT PEERL, EOMI Neck: Trachea midline,  no masses, no thyromegal,y no JVD, no carotid bruit OROPHARYNX:  Moist, No exudate/ erythema/lesions.  Heart: Regular rate and rhythm, without murmurs, rubs, gallops, PMI non-displaced, no heaves or thrills on palpation.  Lungs: Clear to auscultation, no wheezing or rhonchi noted. No increased vocal fremitus resonant to percussion  Abdomen: Soft, nontender, nondistended, positive bowel sounds, no masses no hepatosplenomegaly noted..  Neuro: No focal neurological deficits noted cranial nerves II through XII grossly intact. DTRs 2+ bilaterally upper and lower extremities. Strength 5 out of 5 in bilateral upper and lower  extremities. Musculoskeletal: No warm swelling or erythema around joints, no spinal tenderness noted. Psychiatric: Patient alert and oriented x3, good insight and cognition, good recent to remote recall. Lymph node survey: No cervical axillary or inguinal lymphadenopathy noted.   Data Reviewed: Basic Metabolic Panel: Recent Labs  Lab 02/27/23 0950 03/02/23 2352  NA 135 138  K 3.4* 4.7  CL 101 106  CO2 26 22  GLUCOSE 92  102*  BUN 9 12  CREATININE 0.57* 0.49*  CALCIUM 8.5* 8.5*   Liver Function Tests: Recent Labs  Lab 02/27/23 0950 03/02/23 2352  AST 24 47*  ALT 11 28  ALKPHOS 58 59  BILITOT 2.1* 3.5*  PROT 7.3 7.0  ALBUMIN 4.0 3.6   No results for input(s): "LIPASE", "AMYLASE" in the last 168 hours. No results for input(s): "AMMONIA" in the last 168 hours. CBC: Recent Labs  Lab 02/27/23 0950 03/02/23 2352  WBC 8.4 16.0*  NEUTROABS 5.1 11.1*  HGB 10.2* 6.4*  HCT 30.5* 18.5*  MCV 89.2 87.3  PLT 425* 294   Cardiac Enzymes: No results for input(s): "CKTOTAL", "CKMB", "CKMBINDEX", "TROPONINI" in the last 168 hours. BNP (last 3 results) No results for input(s): "BNP" in the last 8760 hours.  ProBNP (last 3 results) No results for input(s): "PROBNP" in the last 8760 hours.  CBG: No results for input(s): "GLUCAP" in the last 168 hours.  Recent Results (from the past 240 hours)  Culture, blood (x 2)     Status: None (Preliminary result)   Collection Time: 03/02/23 11:52 PM   Specimen: BLOOD LEFT ARM  Result Value Ref Range Status   Specimen Description   Final    BLOOD LEFT ARM Performed at [facility], [address] [place], [place] [address]    Special Requests   Final    BOTTLES DRAWN AEROBIC ONLY Blood Culture results may not be optimal due to an inadequate volume of blood received in culture bottles Performed at [facility], [address], [place], [place] [address]    Culture   Final    NO GROWTH < 12 HOURS Performed at [facility], [address] [place], [place] [address]    Report Status PENDING  Incomplete  Culture, blood (x 2)     Status: None (Preliminary result)   Collection Time: 03/02/23 11:52 PM   Specimen: BLOOD LEFT HAND  Result Value Ref Range Status   Specimen Description   Final    BLOOD LEFT HAND Performed at [facility], [address], [place], [place] [address]    Special  Requests   Final    BOTTLES DRAWN AEROBIC ONLY Blood Culture results may not be optimal due to an inadequate volume of blood received in culture bottles Performed at [facility], [address] [place], [place] [address]    Culture   Final    NO GROWTH < 12 HOURS Performed at [facility], [address] [place], [place] [address]    Report Status PENDING  Incomplete     Studies: No results found.  Scheduled Meds:  sodium chloride   Intravenous Once   apixaban  5 mg Oral BID   folic acid  1 mg Oral Daily   gabapentin  300 mg Oral TID   HYDROmorphone   Intravenous Q4H   hydroxyurea  500 mg Oral BID   ketorolac  15 mg Intravenous Q6H   morphine  15 mg Oral Q12H  OLANZapine  5 mg Oral QHS   senna-docusate  1 tablet Oral BID   Continuous Infusions:  Principal Problem:   Sickle cell anemia with crisis (HCC) Active Problems:   Sickle cell pain crisis (HCC)   Aggressive behavior   Hyperbilirubinemia   Protein-calorie malnutrition, severe (HCC)

## 2023-03-03 NOTE — Progress Notes (Addendum)
     Patient Name: Timothy Marks           DOB: 1991-02-05  MRN: [id number]      Admission Date: 02/27/2023  Attending Provider: Rometta Emery, MD  Primary Diagnosis: Sickle cell anemia with crisis (HCC)   Level of care: Med-Surg   OVERNIGHT PROGRESS REPORT   RN reports patient is febrile 101.7 F, mildly tachycardic. Denies chills, shortness of breath, cough, abdominal changes, or urinary changes. All other vitals stable, no acute changes.  On room air satting 96%. Hx chronic leukocytosis. Previous WBC 8.4.     Hold off on antibiotics for now, monitor fever curve. Added Tylenol, blood cultures, UA, CBC, CMP, lactic acid    Addendum  0100- Hgb 10.2 --> 6.4, no bleeding reported. 1 unit pRBC ordered.  Lactic acid negative. Leukocytosis WBC 16.0.  BC, UA- pending   Anthoney Harada, DNP, ACNPC- AG [facility]

## 2023-03-03 NOTE — Progress Notes (Signed)
Blood product consent signed by patient. Patient stated, "the doctor wants me to get some blood and he wont give me an extra shot of dilaudid, I have some words of him". I previously explained to patient that his medication orders were written and we have to follow what the physician has ordered. Plan of care ongoing.

## 2023-03-04 DIAGNOSIS — D5700 Hb-SS disease with crisis, unspecified: Secondary | ICD-10-CM | POA: Diagnosis not present

## 2023-03-04 MED ORDER — HYDROMORPHONE HCL 1 MG/ML IJ SOLN
0.5000 mg | Freq: Once | INTRAMUSCULAR | Status: AC
  Administered 2023-03-04: 0.5 mg via INTRAVENOUS
  Filled 2023-03-04: qty 0.5

## 2023-03-04 MED ORDER — VALACYCLOVIR HCL 500 MG PO TABS
500.0000 mg | ORAL_TABLET | Freq: Two times a day (BID) | ORAL | Status: AC
  Administered 2023-03-04 – 2023-03-06 (×5): 500 mg via ORAL
  Filled 2023-03-04 (×5): qty 1

## 2023-03-04 MED ORDER — HALOPERIDOL LACTATE 5 MG/ML IJ SOLN
2.0000 mg | Freq: Four times a day (QID) | INTRAMUSCULAR | Status: DC | PRN
Start: 2023-03-04 — End: 2023-03-06

## 2023-03-04 NOTE — Progress Notes (Signed)
Pt asked for another ensure, as he received a vanilla flavor, per his request and as ordered by doctor, last night. Nurse explained to him that he could not, as he is scheduled to get them TID between meals. Pt then stated he has been getting 9 of them a day. Nurse explained the order as received by Dr. Rosetta Posner. He then proceeds to say that he is going to make a write up on him and every one from tonight. He then asks nurse for pen and paper. Nurse stated that she would get this for him, after she gives him the scheduled Toradol. Nurse proceeded to help pt get IV line from under his body, as he was laying on it. He then tells nurse to wait, and that the nurse pulled tape off IV site. Nurse explained that she was no where near the tape to his IV line that was adhered to his upper arm. Pt then proceeds to lift the tape up and pulls it off. As nurse finishes, she says to him that she will get the pen and paper as he has requested. He then tells nurse to not come back in his room.  Nurse said okay. Nurse updated charge nurse of events.

## 2023-03-04 NOTE — Progress Notes (Signed)
Subjective Timothy Marks is a 33 year old male with a medical history significant for sickle cell disease, chronic pain syndrome, opiate dependence and tolerance, oppositional defiance, and anemia of chronic disease that was admitted for sickle cell pain crisis. Patient continues to complain of pain primarily to his lower extremities.  He states that pain intensity is 7/10.  He characterizes pain as constant and throbbing.  He states that pain intensity has not improved very much overnight. Patient encouraged to use his PCA and request as needed medications. Patient denies any shortness of breath, chest pain, urinary symptoms, nausea, vomiting, or diarrhea. Objective:  Vital signs in last 24 hours:  Vitals:   03/04/23 1011 03/04/23 1321 03/04/23 1419 03/04/23 1638  BP: 106/63  102/65   Pulse: 76  88   Resp: 18 18 16 16   Temp: (!) 97.5 F (36.4 C)  99 F (37.2 C)   TempSrc: Oral  Oral   SpO2: 99%  96%   Weight:      Height:        Intake/Output from previous day:   Intake/Output Summary (Last 24 hours) at 03/04/2023 1807 Last data filed at 03/04/2023 1000 Gross per 24 hour  Intake 1200 ml  Output 1050 ml  Net 150 ml    Physical Exam: General: Alert, awake, oriented x3, in no acute distress.  HEENT: [place]/AT PEERL, EOMI Neck: Trachea midline,  no masses, no thyromegal,y no JVD, no carotid bruit OROPHARYNX:  Moist, No exudate/ erythema/lesions.  Heart: Regular rate and rhythm, without murmurs, rubs, gallops, PMI non-displaced, no heaves or thrills on palpation.  Lungs: Clear to auscultation, no wheezing or rhonchi noted. No increased vocal fremitus resonant to percussion  Abdomen: Soft, nontender, nondistended, positive bowel sounds, no masses no hepatosplenomegaly noted..  Neuro: No focal neurological deficits noted cranial nerves II through XII grossly intact. DTRs 2+ bilaterally upper and lower extremities. Strength 5 out of 5 in bilateral upper and lower  extremities. Musculoskeletal: No warm swelling or erythema around joints, no spinal tenderness noted. Psychiatric: Patient alert and oriented x3, good insight and cognition, good recent to remote recall. Lymph node survey: No cervical axillary or inguinal lymphadenopathy noted.  Lab Results:  Basic Metabolic Panel:    Component Value Date/Time   NA 138 03/02/2023 2352   NA 141 09/20/2022 1131   K 4.7 03/02/2023 2352   CL 106 03/02/2023 2352   CO2 22 03/02/2023 2352   BUN 12 03/02/2023 2352   BUN 6 09/20/2022 1131   CREATININE 0.49 (L) 03/02/2023 2352   GLUCOSE 102 (H) 03/02/2023 2352   CALCIUM 8.5 (L) 03/02/2023 2352   CBC:    Component Value Date/Time   WBC 16.0 (H) 03/02/2023 2352   HGB 6.4 (LL) 03/02/2023 2352   HGB 12.3 (L) 09/20/2022 1131   HCT 18.5 (L) 03/02/2023 2352   HCT 40.0 09/20/2022 1131   PLT 294 03/02/2023 2352   PLT 741 (H) 09/20/2022 1131   MCV 87.3 03/02/2023 2352   MCV 93 09/20/2022 1131   NEUTROABS 11.1 (H) 03/02/2023 2352   NEUTROABS 3.1 09/20/2022 1131   LYMPHSABS 3.1 03/02/2023 2352   LYMPHSABS 2.6 09/20/2022 1131   MONOABS 1.2 (H) 03/02/2023 2352   EOSABS 0.3 03/02/2023 2352   EOSABS 0.2 09/20/2022 1131   BASOSABS 0.0 03/02/2023 2352   BASOSABS 0.0 09/20/2022 1131    Recent Results (from the past 240 hours)  Culture, blood (x 2)     Status: None (Preliminary result)   Collection Time: 03/02/23  11:52 PM   Specimen: BLOOD LEFT ARM  Result Value Ref Range Status   Specimen Description   Final    BLOOD LEFT ARM Performed at [facility], [address], [place], [place] [address]    Special Requests   Final    BOTTLES DRAWN AEROBIC ONLY Blood Culture results may not be optimal due to an inadequate volume of blood received in culture bottles Performed at [facility], [address], [place], [place] [address]    Culture   Final    NO GROWTH 1 DAY Performed at [facility], [address]  [name], [place] [address]    Report Status PENDING  Incomplete  Culture, blood (x 2)     Status: None (Preliminary result)   Collection Time: 03/02/23 11:52 PM   Specimen: BLOOD LEFT HAND  Result Value Ref Range Status   Specimen Description   Final    BLOOD LEFT HAND Performed at [facility], [address] [place], [place] [address]    Special Requests   Final    BOTTLES DRAWN AEROBIC ONLY Blood Culture results may not be optimal due to an inadequate volume of blood received in culture bottles Performed at [facility], [address] [place], [place] [address]    Culture   Final    NO GROWTH 1 DAY Performed at [facility], [address] [place], [place] [address]    Report Status PENDING  Incomplete    Studies/Results: No results found.  Medications: Scheduled Meds:  apixaban  5 mg Oral BID   feeding supplement  237 mL Oral TID BM   folic acid  1 mg Oral Daily   gabapentin  300 mg Oral TID   HYDROmorphone   Intravenous Q4H    HYDROmorphone (DILAUDID) injection  0.5 mg Intravenous Once   hydroxyurea  500 mg Oral BID   morphine  15 mg Oral Q12H   OLANZapine  5 mg Oral QHS   senna-docusate  1 tablet Oral BID   valACYclovir  500 mg Oral BID   Continuous Infusions: PRN Meds:.acetaminophen, diphenhydrAMINE, haloperidol lactate, lip balm, mirtazapine, naloxone **AND** sodium chloride flush, ondansetron (ZOFRAN) IV, oxyCODONE-acetaminophen **AND** oxyCODONE, phenol, polyethylene glycol  Consultants: none  Procedures: none  Antibiotics: none  Assessment/Plan: Principal Problem:   Sickle cell anemia with crisis (HCC) Active Problems:   Sickle cell pain crisis (HCC)   Aggressive behavior   Hyperbilirubinemia   Protein-calorie malnutrition, severe (HCC)  Sickle cell disease with pain crisis: Patient continues to rate pain at 7/10.  Pain is primarily to his lower extremities.  Continue IV Dilaudid PCA without  changes. Patient has completed Toradol.  Ibuprofen 800 mg every 8 hours as needed. Also, will continue patient's home pain medication regimen. Monitor vital signs very closely, reevaluate pain scale regularly, and supplemental oxygen as needed. Patient encouraged to ambulate in halls in preparation for discharge on 03/05/2023.  Anemia of chronic disease: Patient's hemoglobin is stable and consistent with his baseline.  There is no clinical indication for blood transfusion at this time.  Monitor closely.  Chronic pain syndrome: Continue home medications  Hypokalemia: Replete.  Labs in AM.  Aggressive behavior: Haldol 2 mg IV every 6 hours as needed for severe agitation.   Code Status: Full Code Family Communication: N/A Disposition Plan: Not yet ready for discharge.  Discharge plan for 03/05/2023  [name]  DNP, APRN, FNP-C [facility]  [facility] [address] [place], [place] [address] [phone]  If 7PM-7AM, please contact night-coverage.  03/04/2023, 6:07 PM  LOS: 5 days

## 2023-03-04 NOTE — Progress Notes (Signed)
Patient has requested additional doses of dilaudid multiple times today. Educated on frequency of PCA pump. Patient now requesting ketamine. Requests communicated with MD. Patient becoming increasingly agitated as the evening progresses, intermittently screaming and moaning. PRN oxycodone offered multiple times, patient declined.

## 2023-03-04 NOTE — Progress Notes (Signed)
Pt stated that he needed medication for lips for having "fever blisters". Nurse didn't see any visible fever blisters at present time. Nurse offered and gave Carmax, which is ordered for his lips. He then stated he needed medication that begins with an "A" for his lips. Nurse shared that this medication isn't ordered currently. He asked that his doctor be notified of this and that he wants it ordered. Shared with him that the doctor will address this in the morning.

## 2023-03-05 DIAGNOSIS — D5700 Hb-SS disease with crisis, unspecified: Secondary | ICD-10-CM | POA: Diagnosis not present

## 2023-03-05 LAB — CBC
HCT: 21 % — ABNORMAL LOW (ref 39.0–52.0)
Hemoglobin: 7.2 g/dL — ABNORMAL LOW (ref 13.0–17.0)
MCH: 30 pg (ref 26.0–34.0)
MCHC: 34.3 g/dL (ref 30.0–36.0)
MCV: 87.5 fL (ref 80.0–100.0)
Platelets: 374 10*3/uL (ref 150–400)
RBC: 2.4 MIL/uL — ABNORMAL LOW (ref 4.22–5.81)
RDW: 20 % — ABNORMAL HIGH (ref 11.5–15.5)
WBC: 11.5 10*3/uL — ABNORMAL HIGH (ref 4.0–10.5)
nRBC: 7 % — ABNORMAL HIGH (ref 0.0–0.2)

## 2023-03-05 MED ORDER — IBUPROFEN 200 MG PO TABS
600.0000 mg | ORAL_TABLET | Freq: Three times a day (TID) | ORAL | Status: DC | PRN
Start: 2023-03-05 — End: 2023-03-06

## 2023-03-05 MED ORDER — OXYCODONE HCL 5 MG PO TABS
5.0000 mg | ORAL_TABLET | ORAL | Status: DC | PRN
  Administered 2023-03-05: 5 mg via ORAL
  Filled 2023-03-05: qty 1

## 2023-03-05 MED ORDER — HYDROMORPHONE HCL 1 MG/ML IJ SOLN
0.5000 mg | Freq: Once | INTRAMUSCULAR | Status: AC
  Administered 2023-03-05: 0.5 mg via INTRAVENOUS
  Filled 2023-03-05: qty 0.5

## 2023-03-05 MED ORDER — OXYCODONE-ACETAMINOPHEN 5-325 MG PO TABS
1.0000 | ORAL_TABLET | ORAL | Status: DC | PRN
  Administered 2023-03-05: 1 via ORAL
  Filled 2023-03-05: qty 1

## 2023-03-05 NOTE — Plan of Care (Signed)
  Problem: Education: Goal: Knowledge of vaso-occlusive preventative measures will improve Outcome: Progressing Goal: Awareness of signs and symptoms of anemia will improve Outcome: Progressing Goal: Long-term complications will improve Outcome: Progressing   Problem: Tissue Perfusion: Goal: Complications related to inadequate tissue perfusion will be avoided or minimized Outcome: Progressing   Problem: Respiratory: Goal: Pulmonary complications will be avoided or minimized Outcome: Progressing   Problem: Sensory: Goal: Pain level will decrease with appropriate interventions Outcome: Progressing   Problem: Health Behavior: Goal: Postive changes in compliance with treatment and prescription regimens will improve Outcome: Progressing   Problem: Education: Goal: Knowledge of General Education information will improve Description: Including pain rating scale, medication(s)/side effects and non-pharmacologic comfort measures Outcome: Progressing   Problem: Health Behavior/Discharge Planning: Goal: Ability to manage health-related needs will improve Outcome: Progressing   Problem: Clinical Measurements: Goal: Respiratory complications will improve Outcome: Progressing   Problem: Activity: Goal: Risk for activity intolerance will decrease Outcome: Progressing   Problem: Elimination: Goal: Will not experience complications related to bowel motility Outcome: Progressing   Problem: Pain Management: Goal: General experience of comfort will improve Outcome: Progressing   Problem: Education: Goal: Knowledge of General Education information will improve Description: Including pain rating scale, medication(s)/side effects and non-pharmacologic comfort measures Outcome: Progressing   Problem: Health Behavior/Discharge Planning: Goal: Ability to manage health-related needs will improve Outcome: Progressing   Problem: Clinical Measurements: Goal: Respiratory complications  will improve Outcome: Progressing   Problem: Activity: Goal: Risk for activity intolerance will decrease Outcome: Progressing   Problem: Elimination: Goal: Will not experience complications related to bowel motility Outcome: Progressing   Problem: Pain Management: Goal: General experience of comfort will improve Outcome: Progressing   Problem: Safety: Goal: Ability to remain free from injury will improve Outcome: Progressing

## 2023-03-05 NOTE — Progress Notes (Signed)
Subjective Timothy Marks is a 33 year old male with a medical history significant for sickle cell disease, chronic pain syndrome, opiate dependence and tolerance, oppositional defiance, and anemia of chronic disease that was admitted for sickle cell pain crisis. Patient continues to complain of pain primarily to his lower extremities.  He states that pain intensity is 7/10.  He characterizes pain as constant and throbbing.  He states that pain intensity has not improved very much overnight. Patient encouraged to use his PCA and request as needed medications. Patient denies any shortness of breath, chest pain, urinary symptoms, nausea, vomiting, or diarrhea. Objective:  Vital signs in last 24 hours:  Vitals:   03/05/23 0348 03/05/23 0601 03/05/23 0842 03/05/23 0927  BP:  108/77  (!) 100/59  Pulse:  (!) 102  89  Resp: 16 16 16 16   Temp:  (!) 97.5 F (36.4 C)  99.4 F (37.4 C)  TempSrc:  Oral  Oral  SpO2:  98%  98%  Weight:      Height:        Intake/Output from previous day:   Intake/Output Summary (Last 24 hours) at 03/05/2023 1100 Last data filed at 03/05/2023 0752 Gross per 24 hour  Intake 840 ml  Output 2000 ml  Net -1160 ml    Physical Exam: General: Alert, awake, oriented x3, in no acute distress.  HEENT: [place]/AT PEERL, EOMI Neck: Trachea midline,  no masses, no thyromegal,y no JVD, no carotid bruit OROPHARYNX:  Moist, No exudate/ erythema/lesions.  Heart: Regular rate and rhythm, without murmurs, rubs, gallops, PMI non-displaced, no heaves or thrills on palpation.  Lungs: Clear to auscultation, no wheezing or rhonchi noted. No increased vocal fremitus resonant to percussion  Abdomen: Soft, nontender, nondistended, positive bowel sounds, no masses no hepatosplenomegaly noted..  Neuro: No focal neurological deficits noted cranial nerves II through XII grossly intact. DTRs 2+ bilaterally upper and lower extremities. Strength 5 out of 5 in bilateral upper and lower  extremities. Musculoskeletal: No warm swelling or erythema around joints, no spinal tenderness noted. Psychiatric: Patient alert and oriented x3, good insight and cognition, good recent to remote recall. Lymph node survey: No cervical axillary or inguinal lymphadenopathy noted.  Lab Results:  Basic Metabolic Panel:    Component Value Date/Time   NA 138 03/02/2023 2352   NA 141 09/20/2022 1131   K 4.7 03/02/2023 2352   CL 106 03/02/2023 2352   CO2 22 03/02/2023 2352   BUN 12 03/02/2023 2352   BUN 6 09/20/2022 1131   CREATININE 0.49 (L) 03/02/2023 2352   GLUCOSE 102 (H) 03/02/2023 2352   CALCIUM 8.5 (L) 03/02/2023 2352   CBC:    Component Value Date/Time   WBC 16.0 (H) 03/02/2023 2352   HGB 6.4 (LL) 03/02/2023 2352   HGB 12.3 (L) 09/20/2022 1131   HCT 18.5 (L) 03/02/2023 2352   HCT 40.0 09/20/2022 1131   PLT 294 03/02/2023 2352   PLT 741 (H) 09/20/2022 1131   MCV 87.3 03/02/2023 2352   MCV 93 09/20/2022 1131   NEUTROABS 11.1 (H) 03/02/2023 2352   NEUTROABS 3.1 09/20/2022 1131   LYMPHSABS 3.1 03/02/2023 2352   LYMPHSABS 2.6 09/20/2022 1131   MONOABS 1.2 (H) 03/02/2023 2352   EOSABS 0.3 03/02/2023 2352   EOSABS 0.2 09/20/2022 1131   BASOSABS 0.0 03/02/2023 2352   BASOSABS 0.0 09/20/2022 1131    Recent Results (from the past 240 hours)  Culture, blood (x 2)     Status: None (Preliminary result)   Collection  Time: 03/02/23 11:52 PM   Specimen: BLOOD LEFT ARM  Result Value Ref Range Status   Specimen Description   Final    BLOOD LEFT ARM Performed at [facility], [address], [place], [place] [address]    Special Requests   Final    BOTTLES DRAWN AEROBIC ONLY Blood Culture results may not be optimal due to an inadequate volume of blood received in culture bottles Performed at [facility], [address] [place], [place] [address]    Culture   Final    NO GROWTH 2 DAYS Performed at [facility], [address],  [place], [place] [address]    Report Status PENDING  Incomplete  Culture, blood (x 2)     Status: None (Preliminary result)   Collection Time: 03/02/23 11:52 PM   Specimen: BLOOD LEFT HAND  Result Value Ref Range Status   Specimen Description   Final    BLOOD LEFT HAND Performed at [facility], [address] [place], [place] [address]    Special Requests   Final    BOTTLES DRAWN AEROBIC ONLY Blood Culture results may not be optimal due to an inadequate volume of blood received in culture bottles Performed at [facility], [address] [place], [place] [address]    Culture   Final    NO GROWTH 2 DAYS Performed at [facility], [address], [place], [place] [address]    Report Status PENDING  Incomplete    Studies/Results: No results found.  Medications: Scheduled Meds:  apixaban  5 mg Oral BID   feeding supplement  237 mL Oral TID BM   folic acid  1 mg Oral Daily   gabapentin  300 mg Oral TID   HYDROmorphone   Intravenous Q4H   hydroxyurea  500 mg Oral BID   morphine  15 mg Oral Q12H   OLANZapine  5 mg Oral QHS   senna-docusate  1 tablet Oral BID   valACYclovir  500 mg Oral BID   Continuous Infusions: PRN Meds:.diphenhydrAMINE, haloperidol lactate, lip balm, mirtazapine, naloxone **AND** sodium chloride flush, ondansetron (ZOFRAN) IV, oxyCODONE-acetaminophen **AND** oxyCODONE, phenol, polyethylene glycol  Consultants: none  Procedures: none  Antibiotics: none  Assessment/Plan: Principal Problem:   Sickle cell anemia with crisis (HCC) Active Problems:   Sickle cell pain crisis (HCC)   Aggressive behavior   Hyperbilirubinemia   Protein-calorie malnutrition, severe (HCC)  Sickle cell disease with pain crisis: Patient continues to rate pain at 7/10.  Pain is primarily to his lower extremities.  Continue IV Dilaudid PCA without changes. Patient has completed Toradol.  Ibuprofen 800 mg every 8 hours as needed. Also,  will continue patient's home pain medication regimen. Monitor vital signs very closely, reevaluate pain scale regularly, and supplemental oxygen as needed. Patient encouraged to ambulate in halls in preparation for discharge on 03/05/2023.  Anemia of chronic disease: Patient's hemoglobin is stable and consistent with his baseline.  There is no clinical indication for blood transfusion at this time.  Monitor closely.  Chronic pain syndrome: Continue home medications  Hypokalemia: Replete.  Labs in AM.  Aggressive behavior: Haldol 2 mg IV every 6 hours as needed for severe agitation.   Code Status: Full Code Family Communication: N/A Disposition Plan: Not yet ready for discharge.  Discharge plan for 03/06/2023  [name]  DNP, APRN, FNP-C [facility] [address] [place],  [place] [address] [phone]  If 7PM-7AM, please contact night-coverage.  03/05/2023, 11:00 AM  LOS: 6 days

## 2023-03-06 ENCOUNTER — Telehealth: Payer: Self-pay

## 2023-03-06 DIAGNOSIS — D5700 Hb-SS disease with crisis, unspecified: Secondary | ICD-10-CM | POA: Diagnosis not present

## 2023-03-06 DIAGNOSIS — D571 Sickle-cell disease without crisis: Secondary | ICD-10-CM | POA: Diagnosis not present

## 2023-03-06 DIAGNOSIS — D5701 Hb-SS disease with acute chest syndrome: Secondary | ICD-10-CM

## 2023-03-06 DIAGNOSIS — G894 Chronic pain syndrome: Secondary | ICD-10-CM | POA: Diagnosis not present

## 2023-03-06 LAB — TYPE AND SCREEN
ABO/RH(D): A POS
Antibody Screen: NEGATIVE
Donor AG Type: NEGATIVE
Unit division: 0

## 2023-03-06 LAB — BPAM RBC
Blood Product Expiration Date: 202502132359
ISSUE DATE / TIME: 202501170448
Unit Type and Rh: 9500

## 2023-03-06 MED ORDER — MORPHINE SULFATE ER 15 MG PO TBCR: 15.0000 mg | EXTENDED_RELEASE_TABLET | Freq: Two times a day (BID) | ORAL | 0 refills | Status: DC

## 2023-03-06 NOTE — Telephone Encounter (Signed)
Copied from CRM [id number]. Topic: General - Other >> Mar 06, 2023  2:01 PM [name] wrote: Reason for CRM: Patient called very upset and ready to be discharged from the hospital//He would like to speak with Dr Jegede//Please call.    Pt will have to wait on D/C form the hospital. [facility]

## 2023-03-06 NOTE — Progress Notes (Signed)
Patient refusing for staff to get vs at this time and is saying derogatory remarks to staff. Patient is not showing any signs of distress. Staff will attempt to get vs at a later time.

## 2023-03-06 NOTE — Discharge Summary (Signed)
Physician Discharge Summary  Timothy Marks MVH:[id number] DOB: May 08, 1990 DOA: 02/27/2023  PCP: Timothy Angst, MD  Admit date: 02/27/2023  Discharge date: 03/06/2023  Discharge Diagnoses:  Principal Problem:   Sickle cell anemia with crisis [facility]) Active Problems:   Sickle cell pain crisis (HCC)   Aggressive behavior   Hyperbilirubinemia   Protein-calorie malnutrition, severe (HCC)  Discharge Condition: Stable  Disposition:   Follow-up Information     Timothy Angst, MD. Schedule an appointment as soon as possible for a visit in 1 week(s).   Specialty: Internal Medicine Contact information: [address] [place] [address] [phone]                Pt is discharged home in good condition and is to follow up with Timothy Angst, MD this week to have labs evaluated. Timothy Marks is instructed to increase activity slowly and balance with rest for the next few days, and use prescribed medication to complete treatment of pain  Diet: Regular Wt Readings from Last 3 Encounters:  03/02/23 54.5 kg  12/20/22 53.5 kg  11/23/22 56.7 kg    History of present illness:  Timothy Marks is a 33 y.o. male with medical history significant of sickle cell disease, depression with anxiety, anemia of chronic disease, chronic pain syndrome among other things who presents to the sickle cell day hospital of complaining of pain rated as 9 out of 10 in his back.  Patient takes Percocet and morphine and took some today but no relief.  Denied any fever or chills.  Denied any nausea vomiting or diarrhea.  Patient is being was admitted to the patient care center for pain management.  Despite several hours of treatment he continues to have significant pain rated as 10 out of 10.  As a result patient has been admitted to the hospital for continued management.   Hospital Course:  Patient was admitted for sickle cell pain crisis and managed appropriately with IVF,  IV Dilaudid via PCA and IV Toradol, as well as other adjunct therapies per sickle cell pain management protocols.  Patient exhibited several episodes of aggressive behavior during this admission.  His crisis was very slow to respond to management regimen.  His hemoglobin dropped to a nadir of 6.4, significantly below his baseline, for which she was transfused with packed red blood cell.  Patient eventually got better with pain returning to his baseline.  He started to ambulate well with no significant pain, was tolerating p.o. intake with no restrictions.  As at today, patient claims he is able to manage his pain at home and wanted to be discharged home. Patient was therefore discharged home today in a hemodynamically stable condition.   Timothy Marks will follow-up with PCP within 1 week of this discharge. Timothy Marks was counseled extensively about nonpharmacologic means of pain management, patient verbalized understanding and was appreciative of  the care received during this admission.   We discussed the need for good hydration, monitoring of hydration status, avoidance of heat, cold, stress, and infection triggers. We discussed the need to be adherent with taking Hydrea and other home medications. Patient was reminded of the need to seek medical attention immediately if any symptom of bleeding, anemia, or infection occurs.  Discharge Exam: Vitals:   03/06/23 1111 03/06/23 1229  BP:    Pulse:    Resp: 18 12  Temp:    SpO2: 100% 100%   Vitals:   03/06/23 0843 03/06/23 6644  03/06/23 1111 03/06/23 1229  BP:  106/75    Pulse:  91    Resp: 12 18 18 12   Temp:  99.1 F (37.3 C)    TempSrc:  Oral    SpO2: 100% 100% 100% 100%  Weight:      Height:        General appearance : Awake, alert, not in any distress. Speech Clear. Not toxic looking HEENT: Atraumatic and Normocephalic, pupils equally reactive to light and accomodation Neck: Supple, no JVD. No cervical lymphadenopathy.  Chest: Good air entry  bilaterally, no added sounds  CVS: S1 S2 regular, no murmurs.  Abdomen: Bowel sounds present, Non tender and not distended with no gaurding, rigidity or rebound. Extremities: B/L Lower Ext shows no edema, both legs are warm to touch Neurology: Awake alert, and oriented X 3, CN II-XII intact, Non focal Skin: No Rash  Discharge Instructions  Discharge Instructions     Diet - low sodium heart healthy   Complete by: As directed    Increase activity slowly   Complete by: As directed       Allergies as of 03/06/2023       Reactions   Buprenorphine Hcl Hives   Levaquin [levofloxacin] Itching   Meperidine Rash   Morphine Hives   Takes MS IR at home   Morphine And Codeine Hives   Takes MSIR PTA   Tramadol Hives   Vancomycin Itching   Zosyn [piperacillin Sod-tazobactam So] Itching, Rash, Other (See Comments)   Has taken rocephin in the past        Medication List     TAKE these medications    cholecalciferol 25 MCG (1000 UNIT) tablet Commonly known as: VITAMIN D3 Take 1 tablet (1,000 Units total) by mouth daily.   diphenhydrAMINE 25 mg capsule Commonly known as: BENADRYL Take 1 capsule (25 mg total) by mouth every 6 (six) hours as needed for itching.   Eliquis 5 MG Tabs tablet Generic drug: apixaban Take 5 mg by mouth 2 (two) times daily.   folic acid 1 MG tablet Commonly known as: FOLVITE Take 1 tablet (1 mg total) by mouth daily.   gabapentin 300 MG capsule Commonly known as: NEURONTIN TAKE 1 CAPSULE(300 MG) BY MOUTH THREE TIMES DAILY What changed: See the new instructions.   hydrocortisone 1 % ointment Apply 1 Application topically 2 (two) times daily.   hydroxyurea 500 MG capsule Commonly known as: HYDREA Take 1 capsule (500 mg total) by mouth 2 (two) times daily. May take with food to minimize GI side effects. What changed: additional instructions   hydrOXYzine 25 MG tablet Commonly known as: ATARAX Take 1 tablet (25 mg total) by mouth daily as needed  for anxiety or vomiting.   ibuprofen 800 MG tablet Commonly known as: ADVIL Take 800 mg by mouth every 8 (eight) hours as needed.   mirtazapine 7.5 MG tablet Commonly known as: REMERON Take 1 tablet (7.5 mg total) by mouth at bedtime as needed (for sleep).   morphine 15 MG 12 hr tablet Commonly known as: MS CONTIN Take 1 tablet (15 mg total) by mouth every 12 (twelve) hours.   naloxone 4 MG/0.1ML Liqd nasal spray kit Commonly known as: NARCAN Place 1 spray into the nose once.   OLANZapine 5 MG tablet Commonly known as: ZYPREXA Take 1 tablet (5 mg total) by mouth at bedtime.   oxyCODONE-acetaminophen 10-325 MG tablet Commonly known as: Percocet Take 1 tablet by mouth every 6 (six) hours as needed  for up to 15 days for pain.        The results of significant diagnostics from this hospitalization (including imaging, microbiology, ancillary and laboratory) are listed below for reference.    Significant Diagnostic Studies: No results found.  Microbiology: Recent Results (from the past 240 hours)  Culture, blood (x 2)     Status: None (Preliminary result)   Collection Time: 03/02/23 11:52 PM   Specimen: BLOOD LEFT ARM  Result Value Ref Range Status   Specimen Description   Final    BLOOD LEFT ARM Performed at [facility], [address], [place], [place] [address]    Special Requests   Final    BOTTLES DRAWN AEROBIC ONLY Blood Culture results may not be optimal due to an inadequate volume of blood received in culture bottles Performed at [facility], [address] [place], [place] [address]    Culture   Final    NO GROWTH 3 DAYS Performed at [facility], [address] [place], [place] [address]    Report Status PENDING  Incomplete  Culture, blood (x 2)     Status: None (Preliminary result)   Collection Time: 03/02/23 11:52 PM   Specimen: BLOOD LEFT HAND  Result Value Ref Range Status   Specimen Description   Final     BLOOD LEFT HAND Performed at [facility], [address], [place], [place] [address]    Special Requests   Final    BOTTLES DRAWN AEROBIC ONLY Blood Culture results may not be optimal due to an inadequate volume of blood received in culture bottles Performed at [facility], [address] [place], [place] [address]    Culture   Final    NO GROWTH 3 DAYS Performed at [facility], [address], [place], [place] [address]    Report Status PENDING  Incomplete     Labs: Basic Metabolic Panel: Recent Labs  Lab 03/02/23 2352  NA 138  K 4.7  CL 106  CO2 22  GLUCOSE 102*  BUN 12  CREATININE 0.49*  CALCIUM 8.5*   Liver Function Tests: Recent Labs  Lab 03/02/23 2352  AST 47*  ALT 28  ALKPHOS 59  BILITOT 3.5*  PROT 7.0  ALBUMIN 3.6   No results for input(s): "LIPASE", "AMYLASE" in the last 168 hours. No results for input(s): "AMMONIA" in the last 168 hours. CBC: Recent Labs  Lab 03/02/23 2352 03/05/23 1836  WBC 16.0* 11.5*  NEUTROABS 11.1*  --   HGB 6.4* 7.2*  HCT 18.5* 21.0*  MCV 87.3 87.5  PLT 294 374   Cardiac Enzymes: No results for input(s): "CKTOTAL", "CKMB", "CKMBINDEX", "TROPONINI" in the last 168 hours. BNP: Invalid input(s): "POCBNP" CBG: No results for input(s): "GLUCAP" in the last 168 hours.  Time coordinating discharge: 50 minutes  Signed:  Hannan Hutmacher  [facility] 03/06/2023, 2:02 PM

## 2023-03-06 NOTE — Progress Notes (Signed)
Discharge paperwork provided to pt. Pt asked RN if he could be discharged to sickle cell clinic so he could get to his car. Pt educated on the risks of driving under the influence of narcotics. Pt then asked if he could be discharged to the ED. RN encouraged pt to call significant other for a ride and offered to take pt to discharge lounge. Pt refused and walked himself off unit.

## 2023-03-07 ENCOUNTER — Telehealth: Payer: Self-pay

## 2023-03-07 NOTE — Transitions of Care (Post Inpatient/ED Visit) (Signed)
   03/07/2023  Name: Timothy Marks MRN: [id number] DOB: 12-05-1990  Today's TOC FU Call Status: Today's TOC FU Call Status:: Unsuccessful Call (1st Attempt) Unsuccessful Call (1st Attempt) Date: 03/07/23  Attempted to reach the patient regarding the most recent Inpatient/ED visit.  Follow Up Plan: Additional outreach attempts will be made to reach the patient to complete the Transitions of Care (Post Inpatient/ED visit) call.   [name] Daphine Deutscher BSN, Programmer, systems / Transitions of Care [place] [facility], [facility] Direct Dial Number:  [phone]

## 2023-03-08 ENCOUNTER — Other Ambulatory Visit: Payer: Self-pay | Admitting: Internal Medicine

## 2023-03-08 ENCOUNTER — Telehealth: Payer: Self-pay

## 2023-03-08 DIAGNOSIS — G894 Chronic pain syndrome: Secondary | ICD-10-CM

## 2023-03-08 LAB — CULTURE, BLOOD (ROUTINE X 2)
Culture: NO GROWTH
Culture: NO GROWTH

## 2023-03-08 NOTE — Transitions of Care (Post Inpatient/ED Visit) (Signed)
   03/08/2023  Name: Timothy Marks MRN: [id number] DOB: Jun 22, 1990  Today's TOC FU Call Status: Today's TOC FU Call Status:: Unsuccessful Call (2nd Attempt) Unsuccessful Call (2nd Attempt) Date: 03/08/23  Attempted to reach the patient regarding the most recent Inpatient/ED visit.  Follow Up Plan: Additional outreach attempts will be made to reach the patient to complete the Transitions of Care (Post Inpatient/ED visit) call.   Ronae Noell Daphine Deutscher BSN, Programmer, systems / Transitions of Care [place] [facility], [facility] Direct Dial Number:  [phone]

## 2023-03-08 NOTE — Telephone Encounter (Signed)
Copied from CRM [id number]. Topic: Clinical - Medication Refill >> Mar 08, 2023  2:09 PM [name] wrote: Most Recent Primary Care Visit:  Provider: Massie Maroon  Department: [facility] CARE CENTR  Visit Type: OFFICE VISIT  Date: 12/20/2022  Medication: oxyCODONE-acetaminophen (PERCOCET) 10-325 MG tablet [[id number]]  Has the patient contacted their pharmacy? Yes (Agent: If no, request that the patient contact the pharmacy for the refill. If patient does not wish to contact the pharmacy document the reason why and proceed with request.) (Agent: If yes, when and what did the pharmacy advise?)  Is this the correct pharmacy for this prescription? Yes If no, delete pharmacy and type the correct one.  This is the patient's preferred pharmacy:  [facility] [phone] - [place], [place] - [address] AT [facility] OF [address] [facility] [address] [place] [address] Phone: [phone] Fax: [phone]  [place] [facility] [address] [place] [address] Phone: [phone] Fax: [phone]  [facility] [address] [place]), [place] - [address] [place] [address] Phone: [phone] Fax: [phone]  [facility] [address] [place] [address] Phone: [phone] Fax: [phone]   Has the prescription been filled recently? No  Is the patient out of the medication? Yes  Has the patient been seen for an appointment in the last year OR does the patient have an upcoming appointment? Yes  Can we respond through MyChart? No  Agent: Please be advised that Rx refills may take up to 3 business days. We ask that you follow-up with your pharmacy.

## 2023-03-09 ENCOUNTER — Telehealth: Payer: Self-pay

## 2023-03-09 ENCOUNTER — Other Ambulatory Visit: Payer: Self-pay | Admitting: Internal Medicine

## 2023-03-09 DIAGNOSIS — G894 Chronic pain syndrome: Secondary | ICD-10-CM

## 2023-03-09 NOTE — Transitions of Care (Post Inpatient/ED Visit) (Signed)
   03/09/2023  Name: Timothy Marks MRN: [id number] DOB: [birth year]  Today's TOC FU Call Status: Today's TOC FU Call Status:: Unsuccessful Call (3rd Attempt) Unsuccessful Call (3rd Attempt) Date: 03/09/23  Attempted to reach the patient regarding the most recent Inpatient/ED visit. Left a confidential HIPAA Compliant VM on personal cell phone.   Follow Up Plan: No further outreach attempts will be made at this time. We have been unable to contact the patient.   Gabriel Cirri MSN, RN RN Case Sales executive Health  [facility] Office Hours Wed/Thur  8:00 am-6:00 pm Direct Dial: [phone] Main Phone [phone]  Fax: [phone] [email or web address]

## 2023-03-09 NOTE — Telephone Encounter (Signed)
Please advise . He will see Mitzi Davenport 03/21/23 . Thanks [employer]

## 2023-03-09 NOTE — Telephone Encounter (Signed)
2nd request for pain med. This has been removed due to awaiting a decision form Dr. Hyman Hopes. KH

## 2023-03-09 NOTE — Telephone Encounter (Signed)
Copied from CRM [id number]. Topic: Clinical - Medication Refill >> Mar 09, 2023  8:08 AM Nada Libman H wrote: Most Recent Primary Care Visit:  Provider: Massie Maroon  Department: [facility] CARE CENTR  Visit Type: OFFICE VISIT  Date: 12/20/2022  Medication: oxyCODONE-acetaminophen (PERCOCET) 10-325 MG tablet [[id number]]  Has the patient contacted their pharmacy? No (Agent: If no, request that the patient contact the pharmacy for the refill. If patient does not wish to contact the pharmacy document the reason why and proceed with request.) (Agent: If yes, when and what did the pharmacy advise?)  Is this the correct pharmacy for this prescription? Yes If no, delete pharmacy and type the correct one.  This is the patient's preferred pharmacy:  [facility] [phone] - [place], [place] - [address] AT [facility] OF [address] [place] [address] Phone: [phone] Fax: [phone]     Has the prescription been filled recently? No  Is the patient out of the medication? Yes  Has the patient been seen for an appointment in the last year OR does the patient have an upcoming appointment? Yes  Can we respond through MyChart? No  Agent: Please be advised that Rx refills may take up to 3 business days. We ask that you follow-up with your pharmacy.

## 2023-03-10 ENCOUNTER — Other Ambulatory Visit: Payer: Self-pay | Admitting: Internal Medicine

## 2023-03-10 ENCOUNTER — Encounter: Payer: Self-pay | Admitting: Internal Medicine

## 2023-03-10 ENCOUNTER — Telehealth: Payer: Self-pay | Admitting: Internal Medicine

## 2023-03-10 MED ORDER — OXYCODONE-ACETAMINOPHEN 10-325 MG PO TABS: 1.0000 | ORAL_TABLET | ORAL | 0 refills | Status: DC | PRN

## 2023-03-10 NOTE — Telephone Encounter (Signed)
Copied from CRM [id number]. Topic: Clinical - Medication Refill >> Mar 10, 2023  9:53 AM Shelah Lewandowsky wrote: Most Recent Primary Care Visit:  Provider: Massie Maroon  Department: [facility] CARE CENTR  Visit Type: OFFICE VISIT  Date: 12/20/2022  Medication: oxyCODONE-acetaminophen (PERCOCET) 10-325 MG tablet   Has the patient contacted their pharmacy? No (Agent: If no, request that the patient contact the pharmacy for the refill. If patient does not wish to contact the pharmacy document the reason why and proceed with request.) (Agent: If yes, when and what did the pharmacy advise?)  Is this the correct pharmacy for this prescription? Yes If no, delete pharmacy and type the correct one.  This is the patient's preferred pharmacy:  [facility] [phone] - [place], [place] - [address] AT [facility] OF [address] [place] [address] Phone: [phone] Fax: [phone]     Has the prescription been filled recently? Yes  Is the patient out of the medication? Yes  Has the patient been seen for an appointment in the last year OR does the patient have an upcoming appointment? Yes  Can we respond through MyChart? No  Agent: Please be advised that Rx refills may take up to 3 business days. We ask that you follow-up with your pharmacy.

## 2023-03-14 ENCOUNTER — Other Ambulatory Visit: Payer: Self-pay | Admitting: Internal Medicine

## 2023-03-14 ENCOUNTER — Other Ambulatory Visit (HOSPITAL_COMMUNITY): Payer: Self-pay

## 2023-03-14 DIAGNOSIS — G894 Chronic pain syndrome: Secondary | ICD-10-CM

## 2023-03-14 DIAGNOSIS — D5700 Hb-SS disease with crisis, unspecified: Secondary | ICD-10-CM | POA: Diagnosis not present

## 2023-03-14 DIAGNOSIS — D571 Sickle-cell disease without crisis: Secondary | ICD-10-CM

## 2023-03-15 ENCOUNTER — Other Ambulatory Visit (HOSPITAL_COMMUNITY): Payer: Self-pay

## 2023-03-15 ENCOUNTER — Other Ambulatory Visit: Payer: Self-pay | Admitting: Internal Medicine

## 2023-03-15 DIAGNOSIS — D571 Sickle-cell disease without crisis: Secondary | ICD-10-CM

## 2023-03-15 DIAGNOSIS — G894 Chronic pain syndrome: Secondary | ICD-10-CM

## 2023-03-15 NOTE — Telephone Encounter (Signed)
Please advise he will be seeing Fola soon. Kh

## 2023-03-15 NOTE — Telephone Encounter (Unsigned)
Copied from CRM [id number]. Topic: Clinical - Medication Refill >> Mar 15, 2023  2:16 PM Jorje Guild R wrote: Most Recent Primary Care Visit:  Provider: Massie Maroon  Department: [facility]  Visit Type: OFFICE VISIT  Date: 12/20/2022  Medication: morphine (MS CONTIN) 15 MG 12 hr tablet  Has the patient contacted their pharmacy? Yes (Agent: If no, request that the patient contact the pharmacy for the refill. If patient does not wish to contact the pharmacy document the reason why and proceed with request.) (Agent: If yes, when and what did the pharmacy advise?)  Is this the correct pharmacy for this prescription? Yes If no, delete pharmacy and type the correct one.  This is the patient's preferred pharmacy:   [facility] [address] [place] [address] Phone: [phone] Fax: [phone]     Has the prescription been filled recently? Yes  Is the patient out of the medication? Yes  Has the patient been seen for an appointment in the last year OR does the patient have an upcoming appointment? Yes  Can we respond through MyChart? Yes  Agent: Please be advised that Rx refills may take up to 3 business days. We ask that you follow-up with your pharmacy.

## 2023-03-16 ENCOUNTER — Telehealth: Payer: Self-pay

## 2023-03-16 ENCOUNTER — Other Ambulatory Visit: Payer: Self-pay

## 2023-03-16 NOTE — Telephone Encounter (Signed)
Copied from CRM [id number]. Topic: Clinical - Prescription Issue >> Mar 16, 2023  2:56 PM Victorino Dike T wrote: Reason for CRM: morphine (MS CONTIN) 15 MG 12 hr tablet, [facility], patient needs up date [phone]

## 2023-03-17 ENCOUNTER — Telehealth: Payer: Self-pay | Admitting: Internal Medicine

## 2023-03-17 ENCOUNTER — Other Ambulatory Visit (HOSPITAL_COMMUNITY): Payer: Self-pay

## 2023-03-17 ENCOUNTER — Other Ambulatory Visit: Payer: Self-pay | Admitting: Family Medicine

## 2023-03-17 DIAGNOSIS — G894 Chronic pain syndrome: Secondary | ICD-10-CM

## 2023-03-17 NOTE — Telephone Encounter (Signed)
Copied from CRM [id number]. Topic: Clinical - Prescription Issue >> Mar 17, 2023  8:46 AM Fuller Mandril wrote: Reason for CRM: Patient called checking status of request to have morphine (MS CONTIN) 15 MG 12 hr tablet sent to different pharmacy due to [facility] has it on back order and not sure when it will be available. Last filled 12/18 per patient. Would like changed to [facility]. Thank You

## 2023-03-17 NOTE — Telephone Encounter (Signed)
Copied from CRM [id number]. Topic: Clinical - Prescription Issue >> Mar 17, 2023  2:40 PM Antony Haste wrote: Reason for CRM: PT is needing Morphine 15 MG 12 hr tablet signed off to [facility] before the weekend if possible.

## 2023-03-17 NOTE — Telephone Encounter (Signed)
Pt advised he doesn't think he is one of the pt that have to pick medications up at  a brand pharmacy. Pt was advised to call insurance company to find out. Pt stated he is completley out and still would like it sent to [facility]. Thanks [employer]

## 2023-03-18 ENCOUNTER — Other Ambulatory Visit (HOSPITAL_COMMUNITY): Payer: Self-pay

## 2023-03-20 ENCOUNTER — Other Ambulatory Visit: Payer: Self-pay | Admitting: Internal Medicine

## 2023-03-20 ENCOUNTER — Telehealth: Payer: Self-pay | Admitting: Internal Medicine

## 2023-03-20 ENCOUNTER — Other Ambulatory Visit (HOSPITAL_COMMUNITY): Payer: Self-pay

## 2023-03-20 ENCOUNTER — Other Ambulatory Visit: Payer: Self-pay

## 2023-03-20 DIAGNOSIS — G894 Chronic pain syndrome: Secondary | ICD-10-CM

## 2023-03-20 DIAGNOSIS — D571 Sickle-cell disease without crisis: Secondary | ICD-10-CM

## 2023-03-20 MED ORDER — APIXABAN 5 MG PO TABS
5.0000 mg | ORAL_TABLET | Freq: Two times a day (BID) | ORAL | 1 refills | Status: DC
  Filled 2023-03-20 (×2): qty 60, 30d supply, fill #0
  Filled 2023-05-24: qty 60, 30d supply, fill #1

## 2023-03-20 MED ORDER — IBUPROFEN 800 MG PO TABS
800.0000 mg | ORAL_TABLET | Freq: Three times a day (TID) | ORAL | 1 refills | Status: DC | PRN
  Filled 2023-03-20: qty 30, 10d supply, fill #0

## 2023-03-20 NOTE — Telephone Encounter (Signed)
Copied from CRM [id number]. Topic: Clinical - Prescription Issue >> Mar 20, 2023 10:57 AM Ivette P wrote: Reason for CRM: Pt is calling in because Medication was sent to incorrect pharmacy. Pt is very adamant and wants prescription refilled at new pharmacy, pt wants this fixed today, Pt is in pain  Prescription :  morphine (MS CONTIN) 15 MG 12 hr tablet   Pharmacy: [facility] [address] [place] [address] Phone: [phone] Fax: [phone]

## 2023-03-20 NOTE — Telephone Encounter (Unsigned)
Copied from CRM [id number]. Topic: Clinical - Medication Refill >> Mar 20, 2023  3:19 PM [name] wrote: Most Recent Primary Care Visit:  Provider: Massie Maroon  Department: [facility]  Visit Type: OFFICE VISIT  Date: 12/20/2022  Medication: morphine (MS CONTIN) 15 MG 12 hr tablet [[id number]]  Has the patient contacted their pharmacy? Yes (Agent: If no, request that the patient contact the pharmacy for the refill. If patient does not wish to contact the pharmacy document the reason why and proceed with request.) (Agent: If yes, when and what did the pharmacy advise?)  Is this the correct pharmacy for this prescription? Yes If no, delete pharmacy and type the correct one.  This is the patient's preferred pharmacy:   [facility] - [facility] [address] [place] [address] Phone: [phone] Fax: [phone]   Has the prescription been filled recently? No  Is the patient out of the medication? Yes  Has the patient been seen for an appointment in the last year OR does the patient have an upcoming appointment? Yes  Can we respond through MyChart? Yes  Agent: Please be advised that Rx refills may take up to 3 business days. We ask that you follow-up with your pharmacy.

## 2023-03-20 NOTE — Telephone Encounter (Signed)
Copied from CRM [id number]. Topic: Clinical - Prescription Issue >> Mar 20, 2023 10:57 AM Ivette P wrote: Reason for CRM: Pt is calling in because Medication was sent to incorrect pharmacy. Pt is very adamant and wants prescription refilled at new pharmacy, pt wants this fixed today, Pt is in pain  Prescription :  morphine (MS CONTIN) 15 MG 12 hr tablet   Pharmacy: [facility] - [facility] [address] [place] [address] Phone: [phone] Fax: [phone]  Please send to [place]. [facility] and [facility] the [facility] script due to it being on back order. Thanks [employer]

## 2023-03-20 NOTE — Telephone Encounter (Signed)
Copied from CRM [id number]. Topic: Clinical - Prescription Issue >> Mar 20, 2023  1:52 PM Lovey Newcomer R wrote: Reason for CRM: Patient checking the status of morphine (MS CONTIN) 15 MG 12 hr tablet. Wanting to know when it will be sent to the other pharmacy. Please provide update asap

## 2023-03-20 NOTE — Telephone Encounter (Signed)
 Please advise Kh

## 2023-03-20 NOTE — Telephone Encounter (Signed)
eason for CRM: Patient called in regard to his Morphine(MS CONTIN) 15 MG to be sent to this pharmacy [place] [facility]    [address] [place] [address]    Phone: [phone] Fax: [phone]    Hours: Mon-Fri 7:30am-6pm; Sat 8:00am-4:30pm    Patient states he normally gets his [facility]s [facility] however the medication is currently on back order and needs filling asap . Patient states if there's any questions please call [phone]     Caller & Relationship to patient:   MRN #  [id number]   Call Back Number:   Date of Last Office Visit: 03/20/2023     Date of Next Office Visit: 03/21/2023    Medication(s) to be Refilled: Morphine 15mg   Preferred Pharmacy: [facility] pharmacy  ** Please notify patient to allow 48-72 hours to process** **Let patient know to contact pharmacy at the end of the day to make sure medication is ready. ** **If patient has not been seen in a year or longer, book an appointment **Advise to use MyChart for refill requests OR to contact their pharmacy

## 2023-03-20 NOTE — Telephone Encounter (Signed)
Copied from CRM [id number]. Topic: Clinical - Medication Refill >> Mar 20, 2023  9:52 AM Shelah Lewandowsky wrote: Most Recent Primary Care Visit:  Provider: Massie Maroon  Department: [facility]  Visit Type: OFFICE VISIT  Date: 12/20/2022  Medication: apixaban (ELIQUIS) 5 MG TABS tablet ibuprofen (ADVIL) 800 MG tablet  Has the patient contacted their pharmacy? No (Agent: If no, request that the patient contact the pharmacy for the refill. If patient does not wish to contact the pharmacy document the reason why and proceed with request.) (Agent: If yes, when and what did the pharmacy advise?)  Is this the correct pharmacy for this prescription? Yes If no, delete pharmacy and type the correct one.  This is the patient's preferred pharmacy:   [facility] [address] [place] [address] Phone: [phone] Fax: [phone]    Has the prescription been filled recently? Yes  Is the patient out of the medication? Yes  Has the patient been seen for an appointment in the last year OR does the patient have an upcoming appointment? Yes  Can we respond through MyChart? Yes  Agent: Please be advised that Rx refills may take up to 3 business days. We ask that you follow-up with your pharmacy.

## 2023-03-21 ENCOUNTER — Telehealth: Payer: Self-pay | Admitting: Nurse Practitioner

## 2023-03-21 ENCOUNTER — Encounter: Payer: Self-pay | Admitting: Nurse Practitioner

## 2023-03-21 ENCOUNTER — Ambulatory Visit (INDEPENDENT_AMBULATORY_CARE_PROVIDER_SITE_OTHER): Payer: Medicare HMO | Admitting: Nurse Practitioner

## 2023-03-21 ENCOUNTER — Other Ambulatory Visit: Payer: Self-pay

## 2023-03-21 ENCOUNTER — Other Ambulatory Visit (HOSPITAL_BASED_OUTPATIENT_CLINIC_OR_DEPARTMENT_OTHER): Payer: Self-pay

## 2023-03-21 ENCOUNTER — Other Ambulatory Visit (HOSPITAL_COMMUNITY): Payer: Self-pay

## 2023-03-21 VITALS — BP 100/64 | HR 86 | Temp 97.8°F | Wt 122.0 lb

## 2023-03-21 DIAGNOSIS — F419 Anxiety disorder, unspecified: Secondary | ICD-10-CM | POA: Insufficient documentation

## 2023-03-21 DIAGNOSIS — G894 Chronic pain syndrome: Secondary | ICD-10-CM | POA: Diagnosis not present

## 2023-03-21 DIAGNOSIS — G4700 Insomnia, unspecified: Secondary | ICD-10-CM | POA: Diagnosis not present

## 2023-03-21 DIAGNOSIS — Z86718 Personal history of other venous thrombosis and embolism: Secondary | ICD-10-CM

## 2023-03-21 DIAGNOSIS — D571 Sickle-cell disease without crisis: Secondary | ICD-10-CM | POA: Diagnosis not present

## 2023-03-21 MED ORDER — OXYCODONE-ACETAMINOPHEN 10-325 MG PO TABS
1.0000 | ORAL_TABLET | ORAL | 0 refills | Status: DC | PRN
  Filled 2023-03-21 (×3): qty 90, 15d supply, fill #0

## 2023-03-21 MED ORDER — MORPHINE SULFATE ER 15 MG PO TBCR
15.0000 mg | EXTENDED_RELEASE_TABLET | Freq: Two times a day (BID) | ORAL | 0 refills | Status: DC
  Filled 2023-03-21: qty 60, 30d supply, fill #0

## 2023-03-21 MED ORDER — FOLIC ACID 1 MG PO TABS
1.0000 mg | ORAL_TABLET | Freq: Every day | ORAL | 2 refills | Status: DC
  Filled 2023-03-21 (×2): qty 90, 90d supply, fill #0
  Filled 2023-06-14 – 2023-08-13 (×3): qty 90, 90d supply, fill #1

## 2023-03-21 MED ORDER — HYDROXYUREA 500 MG PO CAPS
500.0000 mg | ORAL_CAPSULE | Freq: Two times a day (BID) | ORAL | 2 refills | Status: DC
  Filled 2023-03-21: qty 180, 90d supply, fill #0
  Filled 2023-06-17: qty 180, 90d supply, fill #1

## 2023-03-21 NOTE — Assessment & Plan Note (Signed)
Continue mirtazapine 7.5 mg at bedtime as needed

## 2023-03-21 NOTE — Assessment & Plan Note (Addendum)
 Sickle cell disease - Continue Hydrea  500 mg BID. Folic acid  1 mg daily to prevent aplastic bone marrow crises.  We discussed the need for good hydration, monitoring of hydration status, avoidance of heat, cold, stress, and infection triggers. The patient was reminded of the need to seek medical attention of any symptoms of bleeding, anemia, or infection.    Eye - High risk of proliferative retinopathy.  Patient stated that he is up-to-date with his yearly eye exam.   Acute and chronic painful episodes -  .  We discussed that pt is to receive her Schedule II prescriptions only from us . Pt is also aware that the prescription history is available to us  online through the [facility] CSRS. We reminded the patient that all patients receiving Schedule II narcotics must be seen for follow within the 3 months in the office.  We reviewed the terms of our pain agreement, including the need to keep medicines in a safe locked location away from children or pets, and the need to report excess sedation or constipation, measures to avoid constipation. According to the [place] Chronic Pain Initiative program, we have reviewed details related to analgesia, adverse effects, aberrant behaviors. Reviewed [place] Substance Reporting system prior to prescribing opiate medication, no inconsistencies noted.   Continue Percocet 10 325 mg 1 tablet every 4 hours as needed, MS Contin  15 mg every 12 hours.  Ibuprofen  800 mg every 8 hours as needed   1. Sickle cell disease without crisis (HCC) (Primary)  - Sickle Cell Panel - ToxAssure Flex 15, Ur - Ambulatory referral to Hematology / Oncology - oxyCODONE -acetaminophen  (PERCOCET) 10-325 MG tablet; Take 1 tablet by mouth every 4 (four) hours as needed for up to 15 days for pain.  Dispense: 90 tablet; Refill: 0

## 2023-03-21 NOTE — Patient Instructions (Addendum)
 1. Sickle cell disease without crisis (HCC) (Primary)  - Sickle Cell Panel - ToxAssure Flex 15, Ur    It is important that you exercise regularly at least 30 minutes 5 times a week as tolerated  Think about what you will eat, plan ahead. Choose  clean, green, fresh or frozen over canned, processed or packaged foods which are more sugary, salty and fatty. 70 to 75% of food eaten should be vegetables and fruit. Three meals at set times with snacks allowed between meals, but they must be fruit or vegetables. Aim to eat over a 12 hour period , example 7 am to 7 pm, and STOP after  your last meal of the day. Drink water,generally about 64 ounces per day, no other drink is as healthy. Fruit juice is best enjoyed in a healthy way, by EATING the fruit.  Thanks for choosing [facility] we consider it a privelige to serve you.

## 2023-03-21 NOTE — Assessment & Plan Note (Signed)
 Continue Eliquis 5 mg twice daily.

## 2023-03-21 NOTE — Telephone Encounter (Signed)
 Copied from CRM [id number]. Topic: Clinical - Prescription Issue >> Mar 21, 2023  1:49 PM [name] wrote: Reason for CRM: Patients pharmacy is out of  Rx #: [id number]  morphine  (MS CONTIN ) 15 MG 12 hr tablet [[id number]]  Prescription and he does not know where else to send the prescription//Please call patient and advise what other options he may have

## 2023-03-21 NOTE — Progress Notes (Addendum)
 Established Patient Office Visit  Subjective:  Patient ID: Timothy Marks, male    DOB: November 10, 1990  Age: 33 y.o. MRN: [id number]  CC:  Chief Complaint  Patient presents with   Sickle Cell Anemia    HPI Timothy Marks is a 33 y.o. 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  Patient presented establish care with new provider for management of his chronic medical conditions.  Previous PCP  [place]  NP   Sickle cell disease.  Currently on folic acid  1 mg daily, hydroxyurea  500 mg twice daily, MS Contin  15 mg twice daily, Percocet 10 -325 mg, 1 tablet every 4 hours as needed, ibuprofen  800 mg every 8 hours as needed.  Stated that he is pain is currently a 5/10 mainly in his lower back.  Opiates last taking this morning.  Currently denies fever, chills, nausea, vomiting, abdominal pain.  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   Sickle cell anemia (HCC)    Vitamin D  deficiency 2015    Past Surgical History:  Procedure Laterality Date   CHOLECYSTECTOMY      Family History  Problem Relation Age of Onset   Sickle cell trait Mother    Sickle cell trait Father     Social History   Socioeconomic History   Marital status: Single    Spouse name: Not on file   Number of children: Not on file   Years of education: Not on file   Highest education level: 12th grade  Occupational History   Not on file  Tobacco Use   Smoking status: Former    Types: Cigarettes    Passive  exposure: Past   Smokeless tobacco: Never  Vaping Use   Vaping status: Never Used  Substance and Sexual Activity   Alcohol  use: No   Drug use: Yes    Frequency: 7.0 times per week    Types: Marijuana   Sexual activity: Yes  Other Topics Concern   Not on file  Social History Narrative   Not on file   Social Drivers of Health   Financial Resource Strain: Low Risk  (03/20/2023)   Overall Financial Resource Strain (CARDIA)    Difficulty of Paying Living Expenses: Not hard at all  Food Insecurity: No Food Insecurity (03/20/2023)   Hunger Vital Sign    Worried About Running Out of Food in the Last Year: Never true    Ran Out of Food in the Last Year: Never true  Transportation Needs: No Transportation Needs (03/20/2023)   PRAPARE - Administrator, Civil Service (Medical): No    Lack of Transportation (Non-Medical): No  Physical Activity: Sufficiently Active (03/20/2023)   Exercise Vital Sign    Days of Exercise per Week: 3 days    Minutes of Exercise per Session: 60 min  Stress: Stress Concern Present (03/20/2023)   [employer] of Occupational Health - Occupational Stress Questionnaire    Feeling of Stress : Very much  Social Connections: Unknown (03/20/2023)   Social Connection  and Isolation Panel [NHANES]    Frequency of Communication with Friends and Family: Once a week    Frequency of Social Gatherings with Friends and Family: Once a week    Attends Religious Services: Patient declined    Database Administrator or Organizations: No    Attends Banker Meetings: Never    Marital Status: Patient declined  Catering Manager Violence: Not At Risk (02/27/2023)   Humiliation, Afraid, Rape, and Kick questionnaire    Fear of Current or Ex-Partner: No    Emotionally Abused: No    Physically Abused: No    Sexually Abused: No    Outpatient Medications Prior to Visit  Medication Sig Dispense Refill   apixaban  (ELIQUIS ) 5 MG TABS tablet Take 1 tablet (5 mg total)  by mouth 2 (two) times daily. 60 tablet 1   cholecalciferol  (VITAMIN D3) 25 MCG (1000 UNIT) tablet Take 1 tablet (1,000 Units total) by mouth daily. 90 tablet 1   diphenhydrAMINE  (BENADRYL ) 25 mg capsule Take 1 capsule (25 mg total) by mouth every 6 (six) hours as needed for itching. 30 capsule 1   gabapentin  (NEURONTIN ) 300 MG capsule TAKE 1 CAPSULE(300 MG) BY MOUTH THREE TIMES DAILY (Patient taking differently: Take 300 mg by mouth 3 (three) times daily.) 90 capsule 5   hydrOXYzine  (ATARAX ) 25 MG tablet Take 1 tablet (25 mg total) by mouth daily as needed for anxiety or vomiting. 30 tablet 1   ibuprofen  (ADVIL ) 800 MG tablet Take 1 tablet (800 mg total) by mouth every 8 (eight) hours as needed. 30 tablet 1   mirtazapine  (REMERON ) 7.5 MG tablet Take 1 tablet (7.5 mg total) by mouth at bedtime as needed (for sleep). 30 tablet 1   folic acid  (FOLVITE ) 1 MG tablet Take 1 tablet (1 mg total) by mouth daily. 90 tablet 2   hydroxyurea  (HYDREA ) 500 MG capsule Take 1 capsule (500 mg total) by mouth 2 (two) times daily. May take with food to minimize GI side effects. (Patient taking differently: Take 500 mg by mouth 2 (two) times daily.) 180 capsule 2   morphine  (MS CONTIN ) 15 MG 12 hr tablet Take 1 tablet (15 mg total) by mouth every 12 (twelve) hours. 60 tablet 0   oxyCODONE -acetaminophen  (PERCOCET) 10-325 MG tablet Take 1 tablet by mouth every 4 (four) hours as needed for up to 15 days for pain. 60 tablet 0   hydrocortisone  1 % ointment Apply 1 Application topically 2 (two) times daily. (Patient not taking: Reported on 02/27/2023) 30 g 0   naloxone  (NARCAN ) nasal spray 4 mg/0.1 mL Place 1 spray into the nose once. (Patient not taking: Reported on 02/27/2023)     OLANZapine  (ZYPREXA ) 5 MG tablet Take 1 tablet (5 mg total) by mouth at bedtime. (Patient not taking: Reported on 03/21/2023) 30 tablet 2   No facility-administered medications prior to visit.    Allergies  Allergen Reactions   Buprenorphine Hcl  Hives   Levaquin  [Levofloxacin ] Itching   Meperidine Rash   Morphine  Hives    Takes MS IR at home   Morphine  And Codeine Hives    Takes MSIR PTA   Tramadol Hives   Vancomycin Itching   Zosyn [Piperacillin Sod-Tazobactam So] Itching, Rash and Other (See Comments)    Has taken rocephin  in the past    ROS Review of Systems  Constitutional:  Negative for appetite change, chills, fatigue and fever.  HENT:  Negative for congestion, postnasal drip, rhinorrhea and sneezing.  Respiratory:  Negative for cough, shortness of breath and wheezing.   Cardiovascular:  Negative for chest pain, palpitations and leg swelling.  Gastrointestinal:  Negative for abdominal pain, constipation, nausea and vomiting.  Genitourinary:  Negative for difficulty urinating, dysuria, flank pain and frequency.  Musculoskeletal:  Negative for back pain, joint swelling and myalgias.  Skin:  Negative for color change, pallor, rash and wound.  Neurological:  Negative for dizziness, facial asymmetry, weakness, numbness and headaches.  Psychiatric/Behavioral:  Negative for behavioral problems, confusion, self-injury and suicidal ideas.       Objective:    Physical Exam Vitals and nursing note reviewed.  Constitutional:      General: He is not in acute distress.    Appearance: Normal appearance. He is not ill-appearing, toxic-appearing or diaphoretic.  HENT:     Mouth/Throat:     Mouth: Mucous membranes are moist.     Pharynx: Oropharynx is clear. No oropharyngeal exudate or posterior oropharyngeal erythema.  Eyes:     General: Scleral icterus present.        Right eye: No discharge.        Left eye: No discharge.     Extraocular Movements: Extraocular movements intact.     Conjunctiva/sclera: Conjunctivae normal.  Cardiovascular:     Rate and Rhythm: Normal rate and regular rhythm.     Pulses: Normal pulses.     Heart sounds: Normal heart sounds. No murmur heard.    No friction rub. No gallop.  Pulmonary:      Effort: Pulmonary effort is normal. No respiratory distress.     Breath sounds: Normal breath sounds. No stridor. No wheezing, rhonchi or rales.  Chest:     Chest wall: No tenderness.  Abdominal:     General: There is no distension.     Palpations: Abdomen is soft.     Tenderness: There is no abdominal tenderness. There is no right CVA tenderness, left CVA tenderness or guarding.  Musculoskeletal:        General: No swelling, deformity or signs of injury.     Right lower leg: No edema.     Left lower leg: No edema.  Skin:    General: Skin is warm and dry.     Capillary Refill: Capillary refill takes less than 2 seconds.     Coloration: Skin is not jaundiced or pale.     Findings: No bruising, erythema or lesion.  Neurological:     Mental Status: He is alert and oriented to person, place, and time.     Motor: No weakness.     Coordination: Coordination normal.     Gait: Gait normal.  Psychiatric:        Mood and Affect: Mood normal.        Behavior: Behavior normal.        Thought Content: Thought content normal.        Judgment: Judgment normal.     BP 100/64   Pulse 86   Temp 97.8 F (36.6 C)   Wt 122 lb (55.3 kg)   SpO2 100%   BMI 15.66 kg/m  Wt Readings from Last 3 Encounters:  03/21/23 122 lb (55.3 kg)  03/02/23 120 lb 1.7 oz (54.5 kg)  12/20/22 118 lb (53.5 kg)    Lab Results  Component Value Date   TSH 0.884 08/31/2018   Lab Results  Component Value Date   WBC 11.5 (H) 03/05/2023   HGB 7.2 (L) 03/05/2023   HCT 21.0 (L)  03/05/2023   MCV 87.5 03/05/2023   PLT 374 03/05/2023   Lab Results  Component Value Date   NA 138 03/02/2023   K 4.7 03/02/2023   CO2 22 03/02/2023   GLUCOSE 102 (H) 03/02/2023   BUN 12 03/02/2023   CREATININE 0.49 (L) 03/02/2023   BILITOT 3.5 (H) 03/02/2023   ALKPHOS 59 03/02/2023   AST 47 (H) 03/02/2023   ALT 28 03/02/2023   PROT 7.0 03/02/2023   ALBUMIN 3.6 03/02/2023   CALCIUM 8.5 (L) 03/02/2023   ANIONGAP 10  03/02/2023   EGFR 125 09/20/2022   No results found for: CHOL No results found for: HDL No results found for: LDLCALC No results found for: TRIG No results found for: CHOLHDL No results found for: YHAJ8R    Assessment & Plan:   Problem List Items Addressed This Visit       Other   Sickle cell anemia (HCC) - Primary (Chronic)   Sickle cell disease - Continue Hydrea  500 mg BID. Folic acid  1 mg daily to prevent aplastic bone marrow crises.  We discussed the need for good hydration, monitoring of hydration status, avoidance of heat, cold, stress, and infection triggers. The patient was reminded of the need to seek medical attention of any symptoms of bleeding, anemia, or infection.    Eye - High risk of proliferative retinopathy.  Patient stated that he is up-to-date with his yearly eye exam.   Acute and chronic painful episodes -  .  We discussed that pt is to receive her Schedule II prescriptions only from us . Pt is also aware that the prescription history is available to us  online through the [facility] CSRS. We reminded the patient that all patients receiving Schedule II narcotics must be seen for follow within the 3 months in the office.  We reviewed the terms of our pain agreement, including the need to keep medicines in a safe locked location away from children or pets, and the need to report excess sedation or constipation, measures to avoid constipation. According to the [place] Chronic Pain Initiative program, we have reviewed details related to analgesia, adverse effects, aberrant behaviors. Reviewed [place] Substance Reporting system prior to prescribing opiate medication, no inconsistencies noted.   Continue Percocet 10 325 mg 1 tablet every 4 hours as needed, MS Contin  15 mg every 12 hours.  Ibuprofen  800 mg every 8 hours as needed   1. Sickle cell disease without crisis (HCC) (Primary)  - Sickle Cell Panel - ToxAssure Flex 15, Ur - Ambulatory referral to Hematology /  Oncology - oxyCODONE -acetaminophen  (PERCOCET) 10-325 MG tablet; Take 1 tablet by mouth every 4 (four) hours as needed for up to 15 days for pain.  Dispense: 90 tablet; Refill: 0          Relevant Medications   oxyCODONE -acetaminophen  (PERCOCET) 10-325 MG tablet   hydroxyurea  (HYDREA ) 500 MG capsule   folic acid  (FOLVITE ) 1 MG tablet   morphine  (MS CONTIN ) 15 MG 12 hr tablet   Other Relevant Orders   Sickle Cell Panel   ToxAssure Flex 15, Ur   Ambulatory referral to Hematology / Oncology   Chronic pain syndrome   Relevant Medications   oxyCODONE -acetaminophen  (PERCOCET) 10-325 MG tablet   morphine  (MS CONTIN ) 15 MG 12 hr tablet   History of DVT (deep vein thrombosis)   Continue Eliquis  5 mg twice daily      Insomnia   Continue mirtazapine  7.5 mg at bedtime as needed      Anxiety  Continue hydroxyzine  25 mg daily as needed      Other Visit Diagnoses       Hb-SS disease without crisis (HCC)       Relevant Medications   folic acid  (FOLVITE ) 1 MG tablet       Meds ordered this encounter  Medications   oxyCODONE -acetaminophen  (PERCOCET) 10-325 MG tablet    Sig: Take 1 tablet by mouth every 4 (four) hours as needed for up to 15 days for pain.    Dispense:  90 tablet    Refill:  0   hydroxyurea  (HYDREA ) 500 MG capsule    Sig: Take 1 capsule (500 mg total) by mouth 2 (two) times daily. May take with food to minimize GI side effects.    Dispense:  180 capsule    Refill:  2   folic acid  (FOLVITE ) 1 MG tablet    Sig: Take 1 tablet (1 mg total) by mouth daily.    Dispense:  90 tablet    Refill:  2   morphine  (MS CONTIN ) 15 MG 12 hr tablet    Sig: Take 1 tablet (15 mg total) by mouth every 12 (twelve) hours.    Dispense:  60 tablet    Refill:  0    Follow-up: Return in about 3 months (around 06/18/2023), or sickle cell.    Kohl Polinsky R Correna Meacham, FNP

## 2023-03-21 NOTE — Assessment & Plan Note (Signed)
Continue hydroxyzine 25 mg daily as needed

## 2023-03-21 NOTE — Addendum Note (Signed)
Addended by: Donell Beers on: 03/21/2023 01:33 PM   Modules accepted: Orders

## 2023-03-22 ENCOUNTER — Other Ambulatory Visit (HOSPITAL_COMMUNITY): Payer: Self-pay

## 2023-03-22 ENCOUNTER — Other Ambulatory Visit: Payer: Self-pay | Admitting: Nurse Practitioner

## 2023-03-22 DIAGNOSIS — E559 Vitamin D deficiency, unspecified: Secondary | ICD-10-CM

## 2023-03-22 LAB — CMP14+CBC/D/PLT+FER+RETIC+V...
ALT: 9 [IU]/L (ref 0–44)
AST: 17 [IU]/L (ref 0–40)
Albumin: 4.4 g/dL (ref 4.1–5.1)
Alkaline Phosphatase: 90 [IU]/L (ref 44–121)
BUN/Creatinine Ratio: 6 — ABNORMAL LOW (ref 9–20)
BUN: 4 mg/dL — ABNORMAL LOW (ref 6–20)
Basophils Absolute: 0 10*3/uL (ref 0.0–0.2)
Basos: 0 %
Bilirubin Total: 0.7 mg/dL (ref 0.0–1.2)
CO2: 20 mmol/L (ref 20–29)
Calcium: 9 mg/dL (ref 8.7–10.2)
Chloride: 105 mmol/L (ref 96–106)
Creatinine, Ser: 0.62 mg/dL — ABNORMAL LOW (ref 0.76–1.27)
EOS (ABSOLUTE): 0.1 10*3/uL (ref 0.0–0.4)
Eos: 2 %
Ferritin: 1744 ng/mL — ABNORMAL HIGH (ref 30–400)
Globulin, Total: 2.8 g/dL (ref 1.5–4.5)
Glucose: 79 mg/dL (ref 70–99)
Hematocrit: 38.4 % (ref 37.5–51.0)
Hemoglobin: 11.6 g/dL — ABNORMAL LOW (ref 13.0–17.7)
Immature Grans (Abs): 0 10*3/uL (ref 0.0–0.1)
Immature Granulocytes: 0 %
Lymphocytes Absolute: 2.2 10*3/uL (ref 0.7–3.1)
Lymphs: 45 %
MCH: 27 pg (ref 26.6–33.0)
MCHC: 30.2 g/dL — ABNORMAL LOW (ref 31.5–35.7)
MCV: 90 fL (ref 79–97)
Monocytes Absolute: 0.5 10*3/uL (ref 0.1–0.9)
Monocytes: 10 %
NRBC: 3 % — ABNORMAL HIGH (ref 0–0)
Neutrophils Absolute: 2.1 10*3/uL (ref 1.4–7.0)
Neutrophils: 43 %
Platelets: 454 10*3/uL — ABNORMAL HIGH (ref 150–450)
Potassium: 3.8 mmol/L (ref 3.5–5.2)
RBC: 4.29 x10E6/uL (ref 4.14–5.80)
RDW: 18.7 % — ABNORMAL HIGH (ref 11.6–15.4)
Retic Ct Pct: 6 % — ABNORMAL HIGH (ref 0.6–2.6)
Sodium: 143 mmol/L (ref 134–144)
Total Protein: 7.2 g/dL (ref 6.0–8.5)
Vit D, 25-Hydroxy: 24.7 ng/mL — ABNORMAL LOW (ref 30.0–100.0)
WBC: 5 10*3/uL (ref 3.4–10.8)
eGFR: 129 mL/min/{1.73_m2} (ref >59)

## 2023-03-22 MED ORDER — VITAMIN D3 25 MCG (1000 UNIT) PO TABS
1000.0000 [IU] | ORAL_TABLET | Freq: Every day | ORAL | 1 refills | Status: DC
  Filled 2023-03-22: qty 90, 90d supply, fill #0
  Filled 2023-06-17: qty 90, 90d supply, fill #1

## 2023-03-24 LAB — TOXASSURE FLEX 15, UR
6-ACETYLMORPHINE IA: NEGATIVE ng/mL
7-aminoclonazepam: NOT DETECTED ng/mg{creat}
Alpha-hydroxyalprazolam: NOT DETECTED ng/mg{creat}
Alpha-hydroxymidazolam: NOT DETECTED ng/mg{creat}
Alpha-hydroxytriazolam: NOT DETECTED ng/mg{creat}
Alprazolam: NOT DETECTED ng/mg{creat}
BARBITURATES IA: NEGATIVE ng/mL
BUPRENORPHINE: NEGATIVE
Benzodiazepines: NEGATIVE
Buprenorphine: NOT DETECTED ng/mg{creat}
COCAINE METABOLITE IA: NEGATIVE ng/mL
Clonazepam: NOT DETECTED ng/mg{creat}
Creatinine: 192 mg/dL
Desalkylflurazepam: NOT DETECTED ng/mg{creat}
Desmethyldiazepam: NOT DETECTED ng/mg{creat}
Desmethylflunitrazepam: NOT DETECTED ng/mg{creat}
Diazepam: NOT DETECTED ng/mg{creat}
ETHYL ALCOHOL Enzymatic: NEGATIVE g/dL
FENTANYL: NEGATIVE
Fentanyl: NOT DETECTED ng/mg{creat}
Flunitrazepam: NOT DETECTED ng/mg{creat}
Lorazepam: NOT DETECTED ng/mg{creat}
METHADONE IA: NEGATIVE ng/mL
METHADONE MTB IA: NEGATIVE ng/mL
Midazolam: NOT DETECTED ng/mg{creat}
Norbuprenorphine: NOT DETECTED ng/mg{creat}
Norfentanyl: NOT DETECTED ng/mg{creat}
OPIATE CLASS IA: NEGATIVE ng/mL
OXYCODONE CLASS IA: NEGATIVE ng/mL
Oxazepam: NOT DETECTED ng/mg{creat}
PHENCYCLIDINE IA: NEGATIVE ng/mL
TAPENTADOL, IA: NEGATIVE ng/mL
TRAMADOL IA: NEGATIVE ng/mL
Temazepam: NOT DETECTED ng/mg{creat}

## 2023-03-24 LAB — AMPHETAMINES, MS, UR RFX
Amphetamine: 113 ng/mg{creat}
Amphetamines Confirmation: POSITIVE
MDA (Ecstasy metabolite): NOT DETECTED ng/mg{creat}
MDMA (Ecstasy): NOT DETECTED ng/mg{creat}
Methamphetamine: 152 ng/mg{creat}

## 2023-03-24 LAB — CANNABINOIDS, MS, UR RFX
Cannabinoids Confirmation: POSITIVE
Carboxy-THC: >521 ng/mg{creat}

## 2023-03-27 ENCOUNTER — Telehealth: Payer: Self-pay | Admitting: Nurse Practitioner

## 2023-03-27 NOTE — Telephone Encounter (Signed)
 Please  cal the lab to find out what appropriate drug screen to order for this patient considering the medications that he is staking . He is on morphine  and percocet .Thanks

## 2023-03-28 ENCOUNTER — Other Ambulatory Visit: Payer: Self-pay | Admitting: Nurse Practitioner

## 2023-03-28 DIAGNOSIS — D571 Sickle-cell disease without crisis: Secondary | ICD-10-CM

## 2023-03-29 ENCOUNTER — Other Ambulatory Visit: Payer: Self-pay | Admitting: Nurse Practitioner

## 2023-03-29 DIAGNOSIS — D571 Sickle-cell disease without crisis: Secondary | ICD-10-CM

## 2023-03-29 NOTE — Telephone Encounter (Signed)
Spoke to pt and he was sent a my chart message to advise of a day he can come for repeat  drug screen.  KH

## 2023-03-31 ENCOUNTER — Telehealth: Payer: Self-pay | Admitting: Nurse Practitioner

## 2023-03-31 NOTE — Telephone Encounter (Signed)
Caller & Relationship to patient:   MRN #  [id number]   Call Back Number: [phone]  Date of Last Office Visit: 03/27/2023     Date of Next Office Visit: 04/03/2023    Medication(s) to be Refilled: Oxycodone (due 04/04/23)  Preferred Pharmacy: [facility]  ** Please notify patient to allow 48-72 hours to process** **Let patient know to contact pharmacy at the end of the day to make sure medication is ready. ** **If patient has not been seen in a year or longer, book an appointment **Advise to use MyChart for refill requests OR to contact their pharmacy

## 2023-04-03 ENCOUNTER — Other Ambulatory Visit: Payer: Self-pay | Admitting: Nurse Practitioner

## 2023-04-03 ENCOUNTER — Other Ambulatory Visit: Payer: Medicare HMO

## 2023-04-03 ENCOUNTER — Other Ambulatory Visit (HOSPITAL_COMMUNITY): Payer: Self-pay

## 2023-04-03 DIAGNOSIS — D571 Sickle-cell disease without crisis: Secondary | ICD-10-CM | POA: Diagnosis not present

## 2023-04-03 DIAGNOSIS — G894 Chronic pain syndrome: Secondary | ICD-10-CM

## 2023-04-03 MED ORDER — OXYCODONE-ACETAMINOPHEN 10-325 MG PO TABS
1.0000 | ORAL_TABLET | ORAL | 0 refills | Status: DC | PRN
  Filled 2023-04-03: qty 90, 15d supply, fill #0

## 2023-04-03 NOTE — Telephone Encounter (Signed)
Pt was made  a lab visit appt .  Kh

## 2023-04-03 NOTE — Progress Notes (Signed)
Reviewed PDMP substance reporting system prior to prescribing opiate medications. No inconsistencies noted.   1. Sickle cell disease without crisis (HCC)  - oxyCODONE-acetaminophen (PERCOCET) 10-325 MG tablet; Take 1 tablet by mouth every 4 (four) hours as needed for up to 15 days for pain.  Dispense: 90 tablet; Refill: 0  2. Chronic pain syndrome (Primary)  - oxyCODONE-acetaminophen (PERCOCET) 10-325 MG tablet; Take 1 tablet by mouth every 4 (four) hours as needed for up to 15 days for pain.  Dispense: 90 tablet; Refill: 0

## 2023-04-04 ENCOUNTER — Other Ambulatory Visit: Payer: Self-pay | Admitting: Nurse Practitioner

## 2023-04-04 ENCOUNTER — Other Ambulatory Visit (HOSPITAL_COMMUNITY): Payer: Self-pay

## 2023-04-04 DIAGNOSIS — D571 Sickle-cell disease without crisis: Secondary | ICD-10-CM

## 2023-04-04 DIAGNOSIS — G894 Chronic pain syndrome: Secondary | ICD-10-CM

## 2023-04-04 MED ORDER — OXYCODONE-ACETAMINOPHEN 10-325 MG PO TABS
1.0000 | ORAL_TABLET | ORAL | 0 refills | Status: DC | PRN
Start: 2023-04-05 — End: 2023-04-17
  Filled 2023-04-05: qty 90, 15d supply, fill #0

## 2023-04-04 NOTE — Telephone Encounter (Signed)
Pt was advised Kh 

## 2023-04-04 NOTE — Telephone Encounter (Signed)
Copied from CRM [id number]. Topic: Clinical - Medication Refill >> Apr 04, 2023 10:07 AM Thomes Dinning wrote: Most Recent Primary Care Visit:  Provider: [facility]  Department[facility]  Visit Type: LAB  Date: 04/03/2023  Medication: oxyCODONE-acetaminophen (PERCOCET) 10-325 MG tablet    Has the patient contacted their pharmacy? Yes (Agent: If no, request that the patient contact the pharmacy for the refill. If patient does not wish to contact the pharmacy document the reason why and proceed with request.) (Agent: If yes, when and what did the pharmacy advise?)  Is this the correct pharmacy for this prescription? Yes If no, delete pharmacy and type the correct one.  This is the patient's preferred pharmacy:  [facility] [address] [place] [address] Phone: [phone] Fax: [phone]   Has the prescription been filled recently? Yes  Is the patient out of the medication? Yes  Has the patient been seen for an appointment in the last year OR does the patient have an upcoming appointment? Yes  Can we respond through MyChart? Yes  Agent: Please be advised that Rx refills may take up to 3 business days. We ask that you follow-up with your pharmacy.

## 2023-04-04 NOTE — Progress Notes (Unsigned)
Reviewed PDMP substance reporting system prior to prescribing opiate medications. No inconsistencies noted.  I have called the pharmacy to cancel the prescription previously sent because the patient was not due for medication refill until 04/05/2023 1. Sickle cell disease without crisis (HCC)  - oxyCODONE-acetaminophen (PERCOCET) 10-325 MG tablet; Take 1 tablet by mouth every 4 (four) hours as needed for up to 15 days for pain.  Dispense: 90 tablet; Refill: 0  2. Chronic pain syndrome  - oxyCODONE-acetaminophen (PERCOCET) 10-325 MG tablet; Take 1 tablet by mouth every 4 (four) hours as needed for up to 15 days for pain.  Dispense: 90 tablet; Refill: 0

## 2023-04-05 ENCOUNTER — Other Ambulatory Visit: Payer: Self-pay | Admitting: Nurse Practitioner

## 2023-04-05 ENCOUNTER — Other Ambulatory Visit (HOSPITAL_COMMUNITY): Payer: Self-pay

## 2023-04-05 DIAGNOSIS — G894 Chronic pain syndrome: Secondary | ICD-10-CM

## 2023-04-05 DIAGNOSIS — D571 Sickle-cell disease without crisis: Secondary | ICD-10-CM

## 2023-04-05 LAB — DRUG SCREEN 764883 11+OXYCO+ALC+CRT-BUND

## 2023-04-07 LAB — TOXASSURE FLEX 15, UR
6-ACETYLMORPHINE IA: NEGATIVE ng/mL
AMPHETAMINES IA: NEGATIVE ng/mL
BARBITURATES IA: NEGATIVE ng/mL
COCAINE METABOLITE IA: NEGATIVE ng/mL
Creatinine: 87 mg/dL
ETHYL ALCOHOL Enzymatic: NEGATIVE g/dL
METHADONE IA: NEGATIVE ng/mL
METHADONE MTB IA: NEGATIVE ng/mL
OPIATE CLASS IA: NEGATIVE ng/mL
PHENCYCLIDINE IA: NEGATIVE ng/mL
TAPENTADOL, IA: NEGATIVE ng/mL
TRAMADOL IA: NEGATIVE ng/mL

## 2023-04-07 LAB — OXYCODONE/OXYMORPHONE, CONFIRM
OXYCODONE/OXYMORPH: POSITIVE — AB
OXYCODONE: NEGATIVE
OXYMORPHONE (GC/MS): 625 ng/mL
OXYMORPHONE: POSITIVE — AB

## 2023-04-07 LAB — OXYCODONE CLASS, MS, UR RFX
Noroxycodone: 609 ng/mg{creat}
Noroxymorphone: 323 ng/mg{creat}
Oxycodone Class Confirmation: POSITIVE
Oxycodone: NOT DETECTED ng/mg{creat}
Oxymorphone: 737 ng/mg{creat}

## 2023-04-07 LAB — DRUG SCREEN 764883 11+OXYCO+ALC+CRT-BUND
OXYMORPHONE: 88.5 mg/dL (ref 20.0–300.0)
pH, Urine: 8.2 (ref 4.5–8.9)

## 2023-04-07 LAB — CANNABINOID CONFIRMATION, UR
CANNABINOIDS: POSITIVE — AB
Carboxy THC GC/MS Conf: 576 ng/mL

## 2023-04-07 LAB — CANNABINOIDS, MS, UR RFX
Cannabinoids Confirmation: POSITIVE
Carboxy-THC: 576 ng/mg{creat}

## 2023-04-14 ENCOUNTER — Other Ambulatory Visit (HOSPITAL_COMMUNITY): Payer: Self-pay

## 2023-04-17 ENCOUNTER — Other Ambulatory Visit: Payer: Self-pay | Admitting: Nurse Practitioner

## 2023-04-17 ENCOUNTER — Other Ambulatory Visit (HOSPITAL_COMMUNITY): Payer: Self-pay

## 2023-04-17 DIAGNOSIS — G894 Chronic pain syndrome: Secondary | ICD-10-CM

## 2023-04-17 DIAGNOSIS — D571 Sickle-cell disease without crisis: Secondary | ICD-10-CM

## 2023-04-17 MED ORDER — OXYCODONE-ACETAMINOPHEN 10-325 MG PO TABS
1.0000 | ORAL_TABLET | ORAL | 0 refills | Status: DC | PRN
  Filled 2023-04-20: qty 90, 15d supply, fill #0
  Filled ????-??-??: fill #0

## 2023-04-17 NOTE — Telephone Encounter (Signed)
 Please advise [facility]

## 2023-04-17 NOTE — Progress Notes (Unsigned)
 Reviewed PDMP substance reporting system prior to prescribing opiate medications. No inconsistencies noted.   1. Sickle cell disease without crisis (HCC)  - oxyCODONE-acetaminophen (PERCOCET) 10-325 MG tablet; Take 1 tablet by mouth every 4 (four) hours as needed for up to 15 days for pain.  Dispense: 90 tablet; Refill: 0  2. Chronic pain syndrome  - oxyCODONE-acetaminophen (PERCOCET) 10-325 MG tablet; Take 1 tablet by mouth every 4 (four) hours as needed for up to 15 days for pain.  Dispense: 90 tablet; Refill: 0

## 2023-04-19 DIAGNOSIS — Z681 Body mass index (BMI) 19 or less, adult: Secondary | ICD-10-CM | POA: Diagnosis not present

## 2023-04-19 DIAGNOSIS — Z1159 Encounter for screening for other viral diseases: Secondary | ICD-10-CM | POA: Diagnosis not present

## 2023-04-19 DIAGNOSIS — R0602 Shortness of breath: Secondary | ICD-10-CM | POA: Diagnosis not present

## 2023-04-19 DIAGNOSIS — Z0000 Encounter for general adult medical examination without abnormal findings: Secondary | ICD-10-CM | POA: Diagnosis not present

## 2023-04-19 DIAGNOSIS — R5383 Other fatigue: Secondary | ICD-10-CM | POA: Diagnosis not present

## 2023-04-19 DIAGNOSIS — Z131 Encounter for screening for diabetes mellitus: Secondary | ICD-10-CM | POA: Diagnosis not present

## 2023-04-19 DIAGNOSIS — Z113 Encounter for screening for infections with a predominantly sexual mode of transmission: Secondary | ICD-10-CM | POA: Diagnosis not present

## 2023-04-19 DIAGNOSIS — E559 Vitamin D deficiency, unspecified: Secondary | ICD-10-CM | POA: Diagnosis not present

## 2023-04-19 DIAGNOSIS — D571 Sickle-cell disease without crisis: Secondary | ICD-10-CM | POA: Diagnosis not present

## 2023-04-19 DIAGNOSIS — Z114 Encounter for screening for human immunodeficiency virus [HIV]: Secondary | ICD-10-CM | POA: Diagnosis not present

## 2023-04-20 ENCOUNTER — Other Ambulatory Visit (HOSPITAL_COMMUNITY): Payer: Self-pay

## 2023-04-27 ENCOUNTER — Other Ambulatory Visit (HOSPITAL_COMMUNITY): Payer: Self-pay

## 2023-05-02 ENCOUNTER — Telehealth: Payer: Self-pay | Admitting: Nurse Practitioner

## 2023-05-02 ENCOUNTER — Other Ambulatory Visit: Payer: Self-pay | Admitting: Nurse Practitioner

## 2023-05-02 ENCOUNTER — Other Ambulatory Visit (HOSPITAL_COMMUNITY): Payer: Self-pay

## 2023-05-02 DIAGNOSIS — G894 Chronic pain syndrome: Secondary | ICD-10-CM

## 2023-05-02 DIAGNOSIS — D571 Sickle-cell disease without crisis: Secondary | ICD-10-CM

## 2023-05-02 MED ORDER — OXYCODONE-ACETAMINOPHEN 10-325 MG PO TABS
1.0000 | ORAL_TABLET | ORAL | 0 refills | Status: DC | PRN
  Filled 2023-05-05: qty 90, 15d supply, fill #0

## 2023-05-02 NOTE — Telephone Encounter (Signed)
 Please advise [facility]

## 2023-05-02 NOTE — Telephone Encounter (Signed)
 Copied from CRM [id number]. Topic: Clinical - Medication Refill >> May 02, 2023  8:26 AM Gildardo Pounds wrote: Most Recent Primary Care Visit:  Provider: [facility]  Department: [facility]  Visit Type: LAB  Date: 04/03/2023  Medication: oxyCODONE-acetaminophen (PERCOCET) 10-325 MG tablet  Has the patient contacted their pharmacy? No (Agent: If no, request that the patient contact the pharmacy for the refill. If patient does not wish to contact the pharmacy document the reason why and proceed with request.) (Agent: If yes, when and what did the pharmacy advise?)  Is this the correct pharmacy for this prescription? Yes If no, delete pharmacy and type the correct one.  This is the patient's preferred pharmacy:  [facility] [address] [place] [address] Phone: [phone] Fax: [phone]   Has the prescription been filled recently? No  Is the patient out of the medication? Yes  Has the patient been seen for an appointment in the last year OR does the patient have an upcoming appointment? Yes  Can we respond through MyChart? Yes  Agent: Please be advised that Rx refills may take up to 3 business days. We ask that you follow-up with your pharmacy.

## 2023-05-02 NOTE — Progress Notes (Signed)
 Reviewed PDMP substance reporting system prior to prescribing opiate medications. No inconsistencies noted.   1. Sickle cell disease without crisis (HCC)  - oxyCODONE-acetaminophen (PERCOCET) 10-325 MG tablet; Take 1 tablet by mouth every 4 (four) hours as needed for up to 15 days for pain.  Dispense: 90 tablet; Refill: 0  2. Chronic pain syndrome  - oxyCODONE-acetaminophen (PERCOCET) 10-325 MG tablet; Take 1 tablet by mouth every 4 (four) hours as needed for up to 15 days for pain.  Dispense: 90 tablet; Refill: 0

## 2023-05-03 ENCOUNTER — Other Ambulatory Visit: Payer: Self-pay | Admitting: Nurse Practitioner

## 2023-05-03 ENCOUNTER — Other Ambulatory Visit (HOSPITAL_COMMUNITY): Payer: Self-pay

## 2023-05-03 DIAGNOSIS — D571 Sickle-cell disease without crisis: Secondary | ICD-10-CM

## 2023-05-03 MED ORDER — IBUPROFEN 800 MG PO TABS
800.0000 mg | ORAL_TABLET | Freq: Three times a day (TID) | ORAL | 1 refills | Status: DC | PRN
  Filled 2023-05-03: qty 30, 10d supply, fill #0

## 2023-05-03 MED ORDER — GABAPENTIN 300 MG PO CAPS
300.0000 mg | ORAL_CAPSULE | Freq: Three times a day (TID) | ORAL | 5 refills | Status: DC
  Filled 2023-05-03: qty 90, 30d supply, fill #0
  Filled 2023-09-28: qty 90, 30d supply, fill #1

## 2023-05-03 NOTE — Telephone Encounter (Signed)
 Called to remind patient of virtual AWVS tomorrow. He stated he went to get his morphine and it had been discontinued. He is requesting a call back to discuss

## 2023-05-04 ENCOUNTER — Non-Acute Institutional Stay (HOSPITAL_COMMUNITY)
Admission: AD | Admit: 2023-05-04 | Discharge: 2023-05-04 | Disposition: A | Discharge status: Home / Self Care | Source: Ambulatory Visit | Attending: Internal Medicine | Admitting: Internal Medicine

## 2023-05-04 ENCOUNTER — Telehealth (HOSPITAL_COMMUNITY): Payer: Self-pay | Admitting: *Deleted

## 2023-05-04 ENCOUNTER — Ambulatory Visit: Payer: Self-pay

## 2023-05-04 VITALS — BP 100/64 | Ht 74.0 in | Wt 119.0 lb

## 2023-05-04 DIAGNOSIS — Z2821 Immunization not carried out because of patient refusal: Secondary | ICD-10-CM

## 2023-05-04 DIAGNOSIS — E876 Hypokalemia: Secondary | ICD-10-CM

## 2023-05-04 DIAGNOSIS — F419 Anxiety disorder, unspecified: Secondary | ICD-10-CM | POA: Diagnosis not present

## 2023-05-04 DIAGNOSIS — D638 Anemia in other chronic diseases classified elsewhere: Secondary | ICD-10-CM | POA: Insufficient documentation

## 2023-05-04 DIAGNOSIS — F32A Depression, unspecified: Secondary | ICD-10-CM | POA: Insufficient documentation

## 2023-05-04 DIAGNOSIS — Z0000 Encounter for general adult medical examination without abnormal findings: Secondary | ICD-10-CM

## 2023-05-04 DIAGNOSIS — G894 Chronic pain syndrome: Secondary | ICD-10-CM | POA: Diagnosis not present

## 2023-05-04 DIAGNOSIS — Z7901 Long term (current) use of anticoagulants: Secondary | ICD-10-CM | POA: Diagnosis not present

## 2023-05-04 DIAGNOSIS — D5700 Hb-SS disease with crisis, unspecified: Secondary | ICD-10-CM | POA: Diagnosis present

## 2023-05-04 DIAGNOSIS — Z79899 Other long term (current) drug therapy: Secondary | ICD-10-CM | POA: Insufficient documentation

## 2023-05-04 DIAGNOSIS — Z86718 Personal history of other venous thrombosis and embolism: Secondary | ICD-10-CM | POA: Diagnosis not present

## 2023-05-04 LAB — CBC WITH DIFFERENTIAL/PLATELET
Abs Immature Granulocytes: 0.05 10*3/uL (ref 0.00–0.07)
Basophils Absolute: 0 10*3/uL (ref 0.0–0.1)
Basophils Relative: 0 %
Eosinophils Absolute: 0.1 10*3/uL (ref 0.0–0.5)
Eosinophils Relative: 2 %
HCT: 30.1 % — ABNORMAL LOW (ref 39.0–52.0)
Hemoglobin: 10.5 g/dL — ABNORMAL LOW (ref 13.0–17.0)
Immature Granulocytes: 1 %
Lymphocytes Relative: 20 %
Lymphs Abs: 1.7 10*3/uL (ref 0.7–4.0)
MCH: 30.3 pg (ref 26.0–34.0)
MCHC: 34.9 g/dL (ref 30.0–36.0)
MCV: 87 fL (ref 80.0–100.0)
Monocytes Absolute: 1 10*3/uL (ref 0.1–1.0)
Monocytes Relative: 12 %
Neutro Abs: 5.6 10*3/uL (ref 1.7–7.7)
Neutrophils Relative %: 65 %
Platelets: 402 10*3/uL — ABNORMAL HIGH (ref 150–400)
RBC: 3.46 MIL/uL — ABNORMAL LOW (ref 4.22–5.81)
RDW: 21.9 % — ABNORMAL HIGH (ref 11.5–15.5)
WBC: 8.5 10*3/uL (ref 4.0–10.5)
nRBC: 1.6 % — ABNORMAL HIGH (ref 0.0–0.2)

## 2023-05-04 LAB — COMPREHENSIVE METABOLIC PANEL
ALT: 11 U/L (ref 0–44)
AST: 29 U/L (ref 15–41)
Albumin: 4.3 g/dL (ref 3.5–5.0)
Alkaline Phosphatase: 55 U/L (ref 38–126)
Anion gap: 9 (ref 5–15)
BUN: 6 mg/dL (ref 6–20)
CO2: 23 mmol/L (ref 22–32)
Calcium: 9.1 mg/dL (ref 8.9–10.3)
Chloride: 103 mmol/L (ref 98–111)
Creatinine, Ser: 0.3 mg/dL — ABNORMAL LOW (ref 0.61–1.24)
GFR, Estimated: >60 mL/min (ref >60)
Glucose, Bld: 83 mg/dL (ref 70–99)
Potassium: 3.8 mmol/L (ref 3.5–5.1)
Sodium: 135 mmol/L (ref 135–145)
Total Bilirubin: 3.6 mg/dL — ABNORMAL HIGH (ref 0.0–1.2)
Total Protein: 7.9 g/dL (ref 6.5–8.1)

## 2023-05-04 LAB — LACTATE DEHYDROGENASE: LDH: 317 U/L — ABNORMAL HIGH (ref 98–192)

## 2023-05-04 LAB — RETICULOCYTES
Immature Retic Fract: 42.4 % — ABNORMAL HIGH (ref 2.3–15.9)
RBC.: 3.39 MIL/uL — ABNORMAL LOW (ref 4.22–5.81)
Retic Count, Absolute: 237 10*3/uL — ABNORMAL HIGH (ref 19.0–186.0)
Retic Ct Pct: 7 % — ABNORMAL HIGH (ref 0.4–3.1)

## 2023-05-04 MED ORDER — SODIUM CHLORIDE 0.9% FLUSH: 9.0000 mL | INTRAVENOUS | Status: DC | PRN

## 2023-05-04 MED ORDER — DIPHENHYDRAMINE HCL 25 MG PO CAPS: 25.0000 mg | ORAL_CAPSULE | ORAL | Status: DC | PRN

## 2023-05-04 MED ORDER — ONDANSETRON HCL 4 MG PO TABS: 4.0000 mg | ORAL_TABLET | ORAL | Status: DC | PRN

## 2023-05-04 MED ORDER — ACETAMINOPHEN 500 MG PO TABS
1000.0000 mg | ORAL_TABLET | Freq: Once | ORAL | Status: AC
  Administered 2023-05-04: 1000 mg via ORAL
  Filled 2023-05-04: qty 2

## 2023-05-04 MED ORDER — NALOXONE HCL 0.4 MG/ML IJ SOLN
0.4000 mg | INTRAMUSCULAR | Status: DC | PRN
Start: 2023-05-04 — End: 2023-05-04

## 2023-05-04 MED ORDER — ONDANSETRON HCL 4 MG/2ML IJ SOLN
4.0000 mg | INTRAMUSCULAR | Status: DC | PRN
  Administered 2023-05-04: 4 mg via INTRAVENOUS
  Filled 2023-05-04: qty 2

## 2023-05-04 MED ORDER — ONDANSETRON HCL 4 MG/2ML IJ SOLN: 4.0000 mg | Freq: Four times a day (QID) | INTRAMUSCULAR | Status: DC | PRN

## 2023-05-04 MED ORDER — KETOROLAC TROMETHAMINE 15 MG/ML IJ SOLN
15.0000 mg | Freq: Once | INTRAMUSCULAR | Status: AC
  Administered 2023-05-04: 15 mg via INTRAVENOUS
  Filled 2023-05-04: qty 1

## 2023-05-04 MED ORDER — HYDROMORPHONE 1 MG/ML IV SOLN
INTRAVENOUS | Status: DC
  Administered 2023-05-04: 30 mg via INTRAVENOUS
  Administered 2023-05-04: 9.5 mg via INTRAVENOUS
  Filled 2023-05-04: qty 30

## 2023-05-04 MED ORDER — SODIUM CHLORIDE 0.45 % IV SOLN: INTRAVENOUS | Status: DC

## 2023-05-04 MED ORDER — DIPHENHYDRAMINE HCL 25 MG PO CAPS
25.0000 mg | ORAL_CAPSULE | ORAL | Status: DC | PRN
  Administered 2023-05-04 (×2): 25 mg via ORAL
  Filled 2023-05-04 (×2): qty 1

## 2023-05-04 MED ORDER — SENNOSIDES-DOCUSATE SODIUM 8.6-50 MG PO TABS: 1.0000 | ORAL_TABLET | Freq: Two times a day (BID) | ORAL | Status: DC

## 2023-05-04 MED ORDER — POLYETHYLENE GLYCOL 3350 17 G PO PACK: 17.0000 g | PACK | Freq: Every day | ORAL | Status: DC | PRN

## 2023-05-04 NOTE — Discharge Summary (Signed)
 Physician Discharge Summary  Timothy Marks ZOX:[id number] DOB: Aug 18, 1990 DOA: 05/04/2023  PCP: Donell Beers, FNP  Admit date: 05/04/2023  Discharge date: 05/04/2023  Time spent: 30 minutes  Discharge Diagnoses:  Principal Problem:   Sickle cell anemia with pain [facility])   Discharge Condition: Stable  Diet recommendation: Regular  History of present illness:   Timothy Marks is a 33 y.o. male with history of sickle cell disease depression with anxiety, anemia of chronic disease, chronic pain syndrome among other things who presents to the sickle cell day hospital complaining of pain rated as 9/ 10 in his lower back that is consistent with his previous sickle cell pain crisis.  Patient states that pain intensity has been elevating over the past several days. Patient takes Percocet and morphine and took some today but no relief. Patient attributes pain to change in weather.  Denied any fever, chills, nausea vomiting or diarrhea. No recent travels or sick contacts.    Hospital Course:  Timothy Marks was admitted to the day hospital with sickle cell painful crisis. Patient was treated with IV fluid, weight based IV Dilaudid PCA, IV Toradol, clinician assisted doses as deemed appropriate, and other adjunct therapies per sickle cell pain management protocol. Timothy Marks showed significant improvement symptomatically, pain improved from10/10 to 6/10 at the time of discharge. Patient was discharged home in a hemodynamically stable condition. Timothy Marks will follow-up at the clinic as previously scheduled, continue with home medications as per prior to admission.  Discharge Instructions We discussed the need for good hydration, monitoring of hydration status, avoidance of heat, cold, stress, and infection triggers. We discussed the need to be compliant with taking Hydrea and other home medications. Timothy Marks was reminded of the need to seek medical attention immediately if any symptom of bleeding,  anemia, or infection occurs.  Discharge Exam: Vitals:   05/04/23 1356 05/04/23 1459  BP: 95/75   Pulse: 77   Resp: 13 12  Temp:    SpO2: 96%     General appearance: alert, cooperative and no distress Eyes: conjunctivae/corneas clear. PERRL, EOM's intact. Fundi benign. Neck: no adenopathy, no carotid bruit, no JVD, supple, symmetrical, trachea midline and thyroid not enlarged, symmetric, no tenderness/mass/nodules Back: symmetric, no curvature. ROM normal. No CVA tenderness. Resp: clear to auscultation bilaterally Chest wall: no tenderness Cardio: regular rate and rhythm, S1, S2 normal, no murmur, click, rub or gallop GI: soft, non-tender; bowel sounds normal; no masses,  no organomegaly Extremities: extremities normal, atraumatic, no cyanosis or edema Pulses: 2+ and symmetric Skin: Skin color, texture, turgor normal. No rashes or lesions Neurologic: Grossly normal  Discharge Instructions     Diet - low sodium heart healthy   Complete by: As directed    Increase activity slowly   Complete by: As directed       Allergies as of 05/04/2023       Reactions   Buprenorphine Hcl Hives   Levaquin [levofloxacin] Itching   Meperidine Rash   Morphine Hives   Takes MS IR at home   Morphine And Codeine Hives   Takes MSIR PTA   Tramadol Hives   Vancomycin Itching   Zosyn [piperacillin Sod-tazobactam So] Itching, Rash, Other (See Comments)   Has taken rocephin in the past        Medication List     TAKE these medications    diphenhydrAMINE 25 mg capsule Commonly known as: BENADRYL Take 1 capsule (25 mg total) by mouth every 6 (six) hours as needed  for itching.   Eliquis 5 MG Tabs tablet Generic drug: apixaban Take 1 tablet (5 mg total) by mouth 2 (two) times daily.   folic acid 1 MG tablet Commonly known as: FOLVITE Take 1 tablet (1 mg total) by mouth daily.   gabapentin 300 MG capsule Commonly known as: NEURONTIN Take 1 capsule (300 mg total) by mouth 3 (three)  times daily.   hydrocortisone 1 % ointment Apply 1 Application topically 2 (two) times daily.   hydroxyurea 500 MG capsule Commonly known as: HYDREA Take 1 capsule (500 mg total) by mouth 2 (two) times daily. May take with food to minimize GI side effects.   hydrOXYzine 25 MG tablet Commonly known as: ATARAX Take 1 tablet (25 mg total) by mouth daily as needed for anxiety or vomiting.   ibuprofen 800 MG tablet Commonly known as: ADVIL Take 1 tablet (800 mg total) by mouth every 8 (eight) hours as needed.   mirtazapine 7.5 MG tablet Commonly known as: REMERON Take 1 tablet (7.5 mg total) by mouth at bedtime as needed (for sleep).   naloxone 4 MG/0.1ML Liqd nasal spray kit Commonly known as: NARCAN Place 1 spray into the nose once.   oxyCODONE-acetaminophen 10-325 MG tablet Commonly known as: PERCOCET Take 1 tablet by mouth every 4 (four) hours as needed for up to 15 days for pain. Start taking on: May 05, 2023   vitamin D3 25 MCG tablet Commonly known as: CHOLECALCIFEROL Take 1 tablet (1,000 Units total) by mouth daily.       Allergies  Allergen Reactions   Buprenorphine Hcl Hives   Levaquin [Levofloxacin] Itching   Meperidine Rash   Morphine Hives    Takes MS IR at home   Morphine And Codeine Hives    Takes MSIR PTA   Tramadol Hives   Vancomycin Itching   Zosyn [Piperacillin Sod-Tazobactam So] Itching, Rash and Other (See Comments)    Has taken rocephin in the past     Significant Diagnostic Studies: No results found.  Signed:  Daryll Drown NP  05/04/2023, 3:11 PM

## 2023-05-04 NOTE — Progress Notes (Signed)
 Pt admitted to the day hospital today for sickle cell pain treatment. On arrival, pt rates pain 9/10 to his lower back. Pt received Dilaudid PCA, IV Toradol, IV Zofran, and hydrated with IV fluids via PIV. Pt also received PO Tylenol and Benadryl. At discharge, pt rates pain 5/10. AVS offered, but pt declined. Pt is alert, oriented, and ambulatory at discharge.

## 2023-05-04 NOTE — Patient Instructions (Signed)
 Timothy Marks , Thank you for taking time to come for your Medicare Wellness Visit. I appreciate your ongoing commitment to your health goals. Please review the following plan we discussed and let me know if I can assist you in the future.   Referrals/Orders/Follow-Ups/Clinician Recommendations: patient was advised he is due for Covid and Meningococcal B vaccine.Managing Pain Without Opioids Opioids are strong medicines used to treat moderate to severe pain. For some people, especially those who have long-term (chronic) pain, opioids may not be the best choice for pain management due to: Side effects like nausea, constipation, and sleepiness. The risk of addiction (opioid use disorder). The longer you take opioids, the greater your risk of addiction. Pain that lasts for more than 3 months is called chronic pain. Managing chronic pain usually requires more than one approach and is often provided by a team of health care providers working together (multidisciplinary approach). Pain management may be done at a pain management center or pain clinic. How to manage pain without the use of opioids Use non-opioid medicines Non-opioid medicines for pain may include: Over-the-counter or prescription non-steroidal anti-inflammatory drugs (NSAIDs). These may be the first medicines used for pain. They work well for muscle and bone pain, and they reduce swelling. Acetaminophen. This over-the-counter medicine may work well for milder pain but not swelling. Antidepressants. These may be used to treat chronic pain. A certain type of antidepressant (tricyclics) is often used. These medicines are given in lower doses for pain than when used for depression. Anticonvulsants. These are usually used to treat seizures but may also reduce nerve (neuropathic) pain. Muscle relaxants. These relieve pain caused by sudden muscle tightening (spasms). You may also use a pain medicine that is applied to the skin as a patch, cream, or  gel (topical analgesic), such as a numbing medicine. These may cause fewer side effects than medicines taken by mouth. Do certain therapies as directed Some therapies can help with pain management. They include: Physical therapy. You will do exercises to gain strength and flexibility. A physical therapist may teach you exercises to move and stretch parts of your body that are weak, stiff, or painful. You can learn these exercises at physical therapy visits and practice them at home. Physical therapy may also involve: Massage. Heat wraps or applying heat or cold to affected areas. Electrical signals that interrupt pain signals (transcutaneous electrical nerve stimulation, TENS). Weak lasers that reduce pain and swelling (low-level laser therapy). Signals from your body that help you learn to regulate pain (biofeedback). Occupational therapy. This helps you to learn ways to function at home and work with less pain. Recreational therapy. This involves trying new activities or hobbies, such as a physical activity or drawing. Mental health therapy, including: Cognitive behavioral therapy (CBT). This helps you learn coping skills for dealing with pain. Acceptance and commitment therapy (ACT) to change the way you think and react to pain. Relaxation therapies, including muscle relaxation exercises and mindfulness-based stress reduction. Pain management counseling. This may be individual, family, or group counseling.  Receive medical treatments Medical treatments for pain management include: Nerve block injections. These may include a pain blocker and anti-inflammatory medicines. You may have injections: Near the spine to relieve chronic back or neck pain. Into joints to relieve back or joint pain. Into nerve areas that supply a painful area to relieve body pain. Into muscles (trigger point injections) to relieve some painful muscle conditions. A medical device placed near your spine to help block  pain signals  and relieve nerve pain or chronic back pain (spinal cord stimulation device). Acupuncture. Follow these instructions at home Medicines Take over-the-counter and prescription medicines only as told by your health care provider. If you are taking pain medicine, ask your health care providers about possible side effects to watch out for. Do not drive or use heavy machinery while taking prescription opioid pain medicine. Lifestyle  Do not use drugs or alcohol to reduce pain. If you drink alcohol, limit how much you have to: 0-1 drink a day for women who are not pregnant. 0-2 drinks a day for men. Know how much alcohol is in a drink. In the [place] one drink equals one 12 oz bottle of beer (355 mL), one 5 oz glass of wine (148 mL), or one 1 oz glass of hard liquor (44 mL). Do not use any products that contain nicotine or tobacco. These products include cigarettes, chewing tobacco, and vaping devices, such as e-cigarettes. If you need help quitting, ask your health care provider. Eat a healthy diet and maintain a healthy weight. Poor diet and excess weight may make pain worse. Eat foods that are high in fiber. These include fresh fruits and vegetables, whole grains, and beans. Limit foods that are high in fat and processed sugars, such as fried and sweet foods. Exercise regularly. Exercise lowers stress and may help relieve pain. Ask your health care provider what activities and exercises are safe for you. If your health care provider approves, join an exercise class that combines movement and stress reduction. Examples include yoga and tai chi. Get enough sleep. Lack of sleep may make pain worse. Lower stress as much as possible. Practice stress reduction techniques as told by your therapist. General instructions Work with all your pain management providers to find the treatments that work best for you. You are an important member of your pain management team. There are many things you  can do to reduce pain on your own. Consider joining an online or in-person support group for people who have chronic pain. Keep all follow-up visits. This is important. Where to find more information You can find more information about managing pain without opioids from: American Academy of Pain Medicine: painmed.org Institute for Chronic Pain: instituteforchronicpain.org American Chronic Pain Association: theacpa.org Contact a health care provider if: You have side effects from pain medicine. Your pain gets worse or does not get better with treatments or home therapy. You are struggling with anxiety or depression. Summary Many types of pain can be managed without opioids. Chronic pain may respond better to pain management without opioids. Pain is best managed when you and a team of health care providers work together. Pain management without opioids may include non-opioid medicines, medical treatments, physical therapy, mental health therapy, and lifestyle changes. Tell your health care providers if your pain gets worse or is not being managed well enough. This information is not intended to replace advice given to you by your health care provider. Make sure you discuss any questions you have with your health care provider. Document Revised: 05/13/2020 Document Reviewed: 05/13/2020 Elsevier Patient Education  2024 [employer].  This is a list of the screening recommended for you and due dates:  Health Maintenance  Topic Date Due   COVID-19 Vaccine (1) Never done   Meningitis B Vaccine (1 of 4 - Increased Risk) Never done   Medicare Annual Wellness Visit  01/08/2024   DTaP/Tdap/Td vaccine (7 - Td or Tdap) 12/01/2027   Pneumococcal Vaccination (3 of  3 - PPSV23 or PCV20) 10/06/2053   Flu Shot  Completed   Hepatitis C Screening  Completed   HIV Screening  Completed   HPV Vaccine  Aged Out    Advanced directives: (Declined) Advance directive discussed with you today. Even though you  declined this today, please call our office should you change your mind, and we can give you the proper paperwork for you to fill out.  Next Medicare Annual Wellness Visit scheduled for next year: Yes

## 2023-05-04 NOTE — Telephone Encounter (Signed)
 Patient called requesting to come to the day hospital for sickle cell pain. Patient reports back pain rated 9/10. Reports taking Percocet for pain at 6:00 am. COVID-19 screening done and patient denies all symptoms and exposures. Denies fever, chest pain, nausea, vomiting, diarrhea, abdominal pain and priapism. Admits to having transportation without driving himself. Onyeje, NP notified and advised that patient can come to the day hospital for pain management. Patient advised and expresses an understanding.

## 2023-05-04 NOTE — Progress Notes (Signed)
 Because this visit was a virtual/telehealth visit,  certain criteria was not obtained, such a blood pressure, CBG if applicable, and timed get up and go. Any medications not marked as "taking" were not mentioned during the medication reconciliation part of the visit. Any vitals not documented were not able to be obtained due to this being a telehealth visit or patient was unable to self-report a recent blood pressure reading due to a lack of equipment at home via telehealth. Vitals that have been documented are verbally provided by the patient.   Subjective:   Timothy Marks is a 33 y.o. who presents for a Medicare Wellness preventive visit.  Visit Complete: Virtual I connected with  Timothy Marks on 05/04/23 by a video and audio enabled telemedicine application and verified that I am speaking with the correct person using two identifiers.  Patient Location: Home  Provider Location: Home Office  I discussed the limitations of evaluation and management by telemedicine. The patient expressed understanding and agreed to proceed.  Vital Signs: Because this visit was a virtual/telehealth visit, some criteria may be missing or patient reported. Any vitals not documented were not able to be obtained and vitals that have been documented are patient reported.   Persons Participating in Visit: Patient.  AWV Questionnaire: Yes: Patient Medicare AWV questionnaire was completed by the patient on 04/30/2023; I have confirmed that all information answered by patient is correct and no changes since this date.  Cardiac Risk Factors include: advanced age (>52men, >41 women);male gender;Other (see comment)     Objective:    Today's Vitals   05/04/23 0918  BP: 100/64  Weight: 119 lb (54 kg)   Body mass index is 15.28 kg/m.     05/04/2023    9:14 AM 02/27/2023    8:58 AM 01/04/2023   10:21 AM 11/21/2022    4:55 AM 11/06/2022   10:43 AM 11/02/2022    7:54 PM 10/02/2022   12:14 PM  Advanced  Directives  Does Patient Have a Medical Advance Directive? No No No No No No No  Would patient like information on creating a medical advance directive? No - Patient declined No - Patient declined No - Patient declined Yes (ED - Information included in AVS) No - Patient declined No - Patient declined No - Patient declined    Current Medications (verified) Outpatient Encounter Medications as of 05/04/2023  Medication Sig   apixaban (ELIQUIS) 5 MG TABS tablet Take 1 tablet (5 mg total) by mouth 2 (two) times daily.   cholecalciferol (VITAMIN D3) 25 MCG (1000 UNIT) tablet Take 1 tablet (1,000 Units total) by mouth daily.   diphenhydrAMINE (BENADRYL) 25 mg capsule Take 1 capsule (25 mg total) by mouth every 6 (six) hours as needed for itching.   folic acid (FOLVITE) 1 MG tablet Take 1 tablet (1 mg total) by mouth daily.   gabapentin (NEURONTIN) 300 MG capsule Take 1 capsule (300 mg total) by mouth 3 (three) times daily.   hydroxyurea (HYDREA) 500 MG capsule Take 1 capsule (500 mg total) by mouth 2 (two) times daily. May take with food to minimize GI side effects.   hydrOXYzine (ATARAX) 25 MG tablet Take 1 tablet (25 mg total) by mouth daily as needed for anxiety or vomiting.   ibuprofen (ADVIL) 800 MG tablet Take 1 tablet (800 mg total) by mouth every 8 (eight) hours as needed.   mirtazapine (REMERON) 7.5 MG tablet Take 1 tablet (7.5 mg total) by mouth at bedtime as  needed (for sleep).   [START ON 05/05/2023] oxyCODONE-acetaminophen (PERCOCET) 10-325 MG tablet Take 1 tablet by mouth every 4 (four) hours as needed for up to 15 days for pain.   hydrocortisone 1 % ointment Apply 1 Application topically 2 (two) times daily. (Patient not taking: Reported on 02/27/2023)   naloxone [facility]) nasal spray 4 mg/0.1 mL Place 1 spray into the nose once. (Patient not taking: Reported on 02/27/2023)   [DISCONTINUED] gabapentin (NEURONTIN) 300 MG capsule TAKE 1 CAPSULE(300 MG) BY MOUTH THREE TIMES DAILY (Patient taking  differently: Take 300 mg by mouth 3 (three) times daily.)   [DISCONTINUED] ibuprofen (ADVIL) 800 MG tablet Take 1 tablet (800 mg total) by mouth every 8 (eight) hours as needed.   No facility-administered encounter medications on file as of 05/04/2023.    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   Sickle cell anemia (HCC)    Vitamin D deficiency 2015   Past Surgical History:  Procedure Laterality Date   CHOLECYSTECTOMY     Family History  Problem Relation Age of Onset   Sickle cell trait Mother    Sickle cell trait Father    Social History   Socioeconomic History   Marital status: Single    Spouse name: Not on file   Number of children: Not on file   Years of education: Not on file   Highest education level: 12th grade  Occupational History   Not on file  Tobacco Use   Smoking status: Former    Types: Cigarettes    Passive exposure: Past   Smokeless tobacco: Never  Vaping Use   Vaping status: Never Used  Substance and Sexual Activity   Alcohol use: No   Drug use: Yes    Frequency: 7.0 times per week    Types: Marijuana   Sexual activity: Yes  Other Topics Concern   Not on file  Social History Narrative   Not on file   Social Drivers of Health   Financial Resource Strain: Low Risk  (05/04/2023)   Overall Financial Resource Strain (CARDIA)    Difficulty of Paying Living Expenses: Not hard at all  Food Insecurity: No Food Insecurity (05/04/2023)   Hunger Vital Sign    Worried About Running Out of Food in the Last Year: Never true    Ran Out of Food in the Last Year: Never true   Transportation Needs: No Transportation Needs (05/04/2023)   PRAPARE - Administrator, Civil Service (Medical): No    Lack of Transportation (Non-Medical): No  Physical Activity: Sufficiently Active (05/04/2023)   Exercise Vital Sign    Days of Exercise per Week: 3 days    Minutes of Exercise per Session: 60 min  Stress: Stress Concern Present (05/04/2023)   [employer] of Occupational Health - Occupational Stress Questionnaire    Feeling of Stress : Very much  Social Connections: Unknown (05/04/2023)   Social Connection and Isolation Panel [NHANES]    Frequency of Communication with Friends and Family: Once a week    Frequency of Social Gatherings with Friends and Family: Once a week    Attends Religious Services: Patient declined    Active  Member of Clubs or Organizations: No    Attends Engineer, structural: Never    Marital Status: Patient declined    Tobacco Counseling Counseling given: Not Answered    Clinical Intake:              No results found for: "HGBA1C"             Activities of Daily Living     05/04/2023    9:32 AM 04/30/2023   11:04 AM  In your present state of health, do you have any difficulty performing the following activities:  Hearing? 0 0  Vision? 1 1  Difficulty concentrating or making decisions? 0 0  Walking or climbing stairs? 0 0  Dressing or bathing? 0 0  Doing errands, shopping? 0 0  Preparing Food and eating ? N N  Using the Toilet? N N  In the past six months, have you accidently leaked urine? N N  Do you have problems with loss of bowel control? N N  Managing your Medications? N N  Managing your Finances? N N  Housekeeping or managing your Housekeeping? N N    Patient Care Team: Donell Beers, FNP as PCP - General (Nurse Practitioner)  Indicate any recent Medical Services you may have received from other than [facility] providers in the past year (date may be approximate).     Assessment:    This is a routine wellness examination for Timothy Marks.  Hearing/Vision screen Hearing Screening - Comments:: No hearing is avaliable Vision Screening - Comments:: Yes wear glasses   Goals Addressed             This Visit's Progress    Patient Stated       Patient wants a new car chevy pick up       Depression Screen     05/04/2023    9:22 AM 11/01/2022    8:52 AM 09/20/2022   10:51 AM 08/23/2022   10:15 AM 05/17/2022   10:08 AM 04/21/2022    1:40 PM 10/14/2021    9:36 AM  PHQ 2/9 Scores  PHQ - 2 Score 0 0 0 0 0 0 0  PHQ- 9 Score 0      0    Fall Risk     05/04/2023    9:34 AM 04/30/2023   11:04 AM 04/21/2022    1:43 PM 04/17/2022    9:17 AM 10/14/2021    9:36 AM  Fall Risk   Falls in the past year? 0 0 0 0 0  Number falls in past yr:  0 0 0 0  Injury with Fall?  0 0 0 0  Risk for fall due to : No Fall Risks  No Fall Risks  No Fall Risks  Follow up Falls evaluation completed  Falls prevention discussed  Falls evaluation completed    MEDICARE RISK AT HOME:  Medicare Risk at Home Any stairs in or around the home?: Yes If so, are there any without handrails?: No Home free of loose throw rugs in walkways, pet beds, electrical cords, etc?: No Adequate lighting in your home to reduce risk of falls?: No Life alert?: No Use of a cane, walker or w/c?: No Grab bars in the bathroom?: No Shower chair or bench in shower?: No Elevated toilet seat or a handicapped toilet?: No  TIMED UP AND GO:  Was the test performed?  No  Cognitive Function: 6CIT completed        05/04/2023  9:11 AM 04/21/2022    1:44 PM  6CIT Screen  What Year? 0 points 0 points  What month? 0 points 0 points  What time? 0 points 0 points  Count back from 20 0 points 0 points  Months in reverse 4 points 0 points  Repeat phrase 0 points 0 points  Total Score 4 points 0 points    Immunizations Immunization History  Administered Date(s) Administered   DTaP 12/08/1988, 02/02/1989, 04/03/1989, 03/19/1990,  07/09/1993   H1N1 04/03/2008   HIB (PRP-OMP) 04/03/1989, 05/29/1989, 07/24/1989, 01/06/1990, 12/20/2007   HIB (PRP-T) 12/20/2007   Hepatitis A 10/05/1999   Hepatitis A, Ped/Adol-2 Dose 10/05/1999   Hepatitis B 07/28/1994, 09/02/1994, 08/19/1999   Hepatitis B, PED/ADOLESCENT 07/28/1994, 09/02/1994, 08/19/1999   IPV 12/08/1988, 02/02/1989, 03/19/1990, 07/09/1993   Influenza Split 01/20/2016, 11/30/2017   Influenza, Seasonal, Injecte, Preservative Fre 11/01/2022   Influenza,inj,Quad PF,6+ Mos 03/04/2013, 04/01/2014, 01/20/2016, 11/30/2017, 02/27/2020, 04/22/2021   Influenza-Unspecified 10/16/2014   MMR 01/06/1990, 07/09/1993   Meningococcal Conjugate 12/20/2007, 01/20/2016   Novel Infuenza-h1n1-09 04/03/2008   Pneumococcal Conjugate-13 10/07/1991, 01/14/2015   Pneumococcal Polysaccharide-23 09/18/2008, 03/04/2013   Pneumococcal-Unspecified 01/14/2015   Tdap 11/30/2017    Screening Tests Health Maintenance  Topic Date Due   COVID-19 Vaccine (1) Never done   Meningococcal B Vaccine (1 of 4 - Increased Risk) Never done   Medicare Annual Wellness (AWV)  05/03/2024   DTaP/Tdap/Td (7 - Td or Tdap) 12/01/2027   Pneumococcal Vaccine 49-73 Years old (3 of 3 - PPSV23 or PCV20) 10/06/2053   INFLUENZA VACCINE  Completed   Hepatitis C Screening  Completed   HIV Screening  Completed   HPV VACCINES  Aged Out    Health Maintenance  Health Maintenance Due  Topic Date Due   COVID-19 Vaccine (1) Never done   Meningococcal B Vaccine (1 of 4 - Increased Risk) Never done   Health Maintenance Items Addressed: patient was advised he is due for Covid and Meningococcal B vaccine but patient declined.  Additional Screening:  Vision Screening: Recommended annual ophthalmology exams for early detection of glaucoma and other disorders of the eye.  Dental Screening: Recommended annual dental exams for proper oral hygiene  Community Resource Referral / Chronic Care Management: CRR required this  visit?  No   CCM required this visit?  No     Plan:     I have personally reviewed and noted the following in the patient's chart:   Medical and social history Use of alcohol, tobacco or illicit drugs  Current medications and supplements including opioid prescriptions. Patient is currently taking opioid prescriptions. Information provided to patient regarding non-opioid alternatives. Patient advised to discuss non-opioid treatment plan with their provider. Functional ability and status Nutritional status Physical activity Advanced directives List of other physicians Hospitalizations, surgeries, and ER visits in previous 12 months Vitals Screenings to include cognitive, depression, and falls Referrals and appointments  In addition, I have reviewed and discussed with patient certain preventive protocols, quality metrics, and best practice recommendations. A written personalized care plan for preventive services as well as general preventive health recommendations were provided to patient.     Rudi Heap, [place]   05/04/2023   After Visit Summary: (MyChart) Due to this being a telephonic visit, the after visit summary with patients personalized plan was offered to patient via MyChart   Notes: Nothing significant to report at this time.

## 2023-05-04 NOTE — H&P (Cosign Needed Addendum)
 [facility] History and Physical  Timothy Marks MWU:[id number] DOB: 1990-07-04 DOA: 05/04/2023  PCP: Donell Beers, FNP   Chief Complaint:  Chief Complaint  Patient presents with   Sickle Cell Pain Crisis    HPI: Timothy Marks is a 32 y.o. male with history of sickle cell disease depression with anxiety, anemia of chronic disease, chronic pain syndrome among other things who presents to the sickle cell day hospital complaining of pain rated as 9/ 10 in his lower back that is consistent with his previous sickle cell pain crisis.  Patient states that pain intensity has been elevating over the past several days. Patient takes Percocet and morphine and took some today but no relief. Patient attributes pain to change in weather.  Denied any fever, chills, nausea vomiting or diarrhea. No recent travels or sick contacts.  Systemic Review: General: The patient denies anorexia, fever, weight loss Cardiac: Denies chest pain, syncope, palpitations, pedal edema  Respiratory: Denies cough, shortness of breath, wheezing GI: Denies severe indigestion/heartburn, abdominal pain, nausea, vomiting, diarrhea and constipation GU: Denies hematuria, incontinence, dysuria  Musculoskeletal:Back pain  Skin: Denies suspicious skin lesions Neurologic: Denies focal weakness or numbness, change in vision  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   Sickle cell anemia (HCC)    Vitamin D deficiency 2015    Past Surgical History:  Procedure Laterality Date   CHOLECYSTECTOMY      Allergies  Allergen Reactions   Buprenorphine Hcl Hives   Levaquin [Levofloxacin] Itching   Meperidine Rash   Morphine Hives    Takes MS IR at home   Morphine And Codeine Hives    Takes MSIR PTA   Tramadol  Hives   Vancomycin Itching   Zosyn [Piperacillin Sod-Tazobactam So] Itching, Rash and Other (See Comments)    Has taken rocephin in the past    Family History  Problem Relation Age of Onset   Sickle cell trait Mother    Sickle cell trait Father       Prior to Admission medications   Medication Sig Start Date End Date Taking? Authorizing Provider  apixaban (ELIQUIS) 5 MG TABS tablet Take 1 tablet (5 mg total) by mouth 2 (two) times daily. 03/20/23  Yes Quentin Angst, MD  cholecalciferol (VITAMIN D3) 25 MCG (1000 UNIT) tablet Take 1 tablet (1,000 Units total) by mouth daily. 03/22/23  Yes Paseda, Baird Kay, FNP  diphenhydrAMINE (BENADRYL) 25 mg capsule Take 1 capsule (25 mg total) by mouth every 6 (six) hours as needed for itching. 01/17/23  Yes Paseda, Baird Kay, FNP  folic acid (FOLVITE) 1 MG tablet Take 1 tablet (1 mg total) by mouth daily. 03/21/23  Yes Paseda, Baird Kay, FNP  gabapentin (NEURONTIN) 300 MG capsule Take 1 capsule (300 mg total) by mouth 3 (three) times daily. 05/03/23  Yes Paseda, Baird Kay, FNP  hydroxyurea (HYDREA) 500 MG capsule Take 1 capsule (500 mg total) by mouth 2 (two) times daily. May take with food to minimize GI side effects. 03/21/23  Yes Paseda, Baird Kay, FNP  hydrOXYzine (ATARAX) 25 MG tablet Take 1 tablet (25 mg total) by mouth daily as needed for anxiety or vomiting. 01/17/23  Yes Paseda, Baird Kay, FNP  mirtazapine (REMERON) 7.5 MG tablet Take 1 tablet (7.5 mg total)  by mouth at bedtime as needed (for sleep). 01/17/23  Yes Paseda, Baird Kay, FNP  oxyCODONE-acetaminophen (PERCOCET) 10-325 MG tablet Take 1 tablet by mouth every 4 (four) hours as needed for up to 15 days for pain. 05/05/23 05/20/23 Yes Paseda, Baird Kay, FNP  hydrocortisone 1 % ointment Apply 1 Application topically 2 (two) times daily. Patient not taking: Reported on 02/27/2023 10/14/21   Quentin Angst, MD  ibuprofen (ADVIL) 800 MG tablet Take 1 tablet (800 mg total) by mouth  every 8 (eight) hours as needed. 05/03/23   Paseda, Baird Kay, FNP  naloxone (NARCAN) nasal spray 4 mg/0.1 mL Place 1 spray into the nose once. Patient not taking: Reported on 02/27/2023 09/04/22   [provider]     Physical Exam: Vitals:   05/04/23 1052 05/04/23 1155 05/04/23 1356 05/04/23 1459  BP:  119/61 95/75   Pulse:  75 77   Resp: 20 10 13 12   Temp:      TempSrc:      SpO2:  97% 96%     General: Alert, awake, afebrile, anicteric, not in obvious distress HEENT: Normocephalic and Atraumatic, Mucous membranes pink                PERRLA; EOM intact; No scleral icterus,                 Nares: Patent, Oropharynx: Clear, Fair Dentition                 Neck: FROM, no cervical lymphadenopathy, thyromegaly, carotid bruit or JVD;  CHEST WALL: No tenderness  CHEST: Normal respiration, clear to auscultation bilaterally  HEART: Regular rate and rhythm; no murmurs rubs or gallops  BACK: No kyphosis or scoliosis; no CVA tenderness  ABDOMEN: Positive Bowel Sounds, soft, non-tender; no masses, no organomegaly EXTREMITIES: No cyanosis, clubbing, or edema SKIN:  no rash or ulceration  CNS: Alert and Oriented x 4, Nonfocal exam, CN 2-12 intact  Labs on Admission:  Basic Metabolic Panel: No results for input(s): "NA", "K", "CL", "CO2", "GLUCOSE", "BUN", "CREATININE", "CALCIUM", "MG", "PHOS" in the last 168 hours. Liver Function Tests: No results for input(s): "AST", "ALT", "ALKPHOS", "BILITOT", "PROT", "ALBUMIN" in the last 168 hours. No results for input(s): "LIPASE", "AMYLASE" in the last 168 hours. No results for input(s): "AMMONIA" in the last 168 hours. CBC: Recent Labs  Lab 05/04/23 1035  WBC 8.5  NEUTROABS 5.6  HGB 10.5*  HCT 30.1*  MCV 87.0  PLT 402*   Cardiac Enzymes: No results for input(s): "CKTOTAL", "CKMB", "CKMBINDEX", "TROPONINI" in the last 168 hours.  BNP (last 3 results) No results for input(s): "BNP" in the last 8760 hours.  ProBNP (last 3  results) No results for input(s): "PROBNP" in the last 8760 hours.  CBG: No results for input(s): "GLUCAP" in the last 168 hours.   Assessment/Plan Principal Problem:   Sickle cell anemia with pain (HCC)  Admits to the Day Hospital for extended observation IVF 0.45% Saline @ 100 mls/hour Weight based Dilaudid PCA started within 30 minutes of admission IV Toradol 15 mg x 1 doses Acetaminophen 1000 mg x 1 dose Labs: CBCD, CMP, Retic Count and LDH Monitor vitals very closely, Re-evaluate pain scale every hour 2 L of Oxygen by [place] Patient will be re-evaluated for pain in the context of function and relationship to baseline as care progresses. If no significant relieve from pain (remains above 5/10) will transfer patient to inpatient services for further evaluation and management  Code  Status: Full  Family Communication: None  DVT Prophylaxis: Ambulate as tolerated   Time spent: 35 Minutes  Daryll Drown NP  If 7PM-7AM, please contact night-coverage [email or web address] 05/04/2023, 3:12 PM

## 2023-05-05 ENCOUNTER — Other Ambulatory Visit (HOSPITAL_COMMUNITY): Payer: Self-pay

## 2023-05-11 ENCOUNTER — Telehealth (HOSPITAL_COMMUNITY): Payer: Self-pay | Admitting: *Deleted

## 2023-05-11 ENCOUNTER — Other Ambulatory Visit: Payer: Self-pay

## 2023-05-11 ENCOUNTER — Encounter (HOSPITAL_COMMUNITY): Payer: Self-pay

## 2023-05-11 ENCOUNTER — Emergency Department (HOSPITAL_COMMUNITY)
Admission: EM | Admit: 2023-05-11 | Discharge: 2023-05-11 | Disposition: A | Discharge status: Home / Self Care | Attending: Emergency Medicine | Admitting: Emergency Medicine

## 2023-05-11 DIAGNOSIS — D5700 Hb-SS disease with crisis, unspecified: Secondary | ICD-10-CM | POA: Insufficient documentation

## 2023-05-11 DIAGNOSIS — G894 Chronic pain syndrome: Secondary | ICD-10-CM | POA: Diagnosis not present

## 2023-05-11 DIAGNOSIS — Z7901 Long term (current) use of anticoagulants: Secondary | ICD-10-CM | POA: Insufficient documentation

## 2023-05-11 LAB — BASIC METABOLIC PANEL WITH GFR
Anion gap: 8 (ref 5–15)
BUN: 8 mg/dL (ref 6–20)
CO2: 26 mmol/L (ref 22–32)
Calcium: 9.1 mg/dL (ref 8.9–10.3)
Chloride: 105 mmol/L (ref 98–111)
Creatinine, Ser: 0.52 mg/dL — ABNORMAL LOW (ref 0.61–1.24)
GFR, Estimated: >60 mL/min (ref >60)
Glucose, Bld: 90 mg/dL (ref 70–99)
Potassium: 3.3 mmol/L — ABNORMAL LOW (ref 3.5–5.1)
Sodium: 139 mmol/L (ref 135–145)

## 2023-05-11 LAB — CBC WITH DIFFERENTIAL/PLATELET
Abs Immature Granulocytes: 0.03 10*3/uL (ref 0.00–0.07)
Basophils Absolute: 0 10*3/uL (ref 0.0–0.1)
Basophils Relative: 0 %
Eosinophils Absolute: 0.1 10*3/uL (ref 0.0–0.5)
Eosinophils Relative: 2 %
HCT: 28.6 % — ABNORMAL LOW (ref 39.0–52.0)
Hemoglobin: 10 g/dL — ABNORMAL LOW (ref 13.0–17.0)
Immature Granulocytes: 0 %
Lymphocytes Relative: 26 %
Lymphs Abs: 1.8 10*3/uL (ref 0.7–4.0)
MCH: 31.1 pg (ref 26.0–34.0)
MCHC: 35 g/dL (ref 30.0–36.0)
MCV: 88.8 fL (ref 80.0–100.0)
Monocytes Absolute: 0.7 10*3/uL (ref 0.1–1.0)
Monocytes Relative: 10 %
Neutro Abs: 4.3 10*3/uL (ref 1.7–7.7)
Neutrophils Relative %: 62 %
Platelets: 443 10*3/uL — ABNORMAL HIGH (ref 150–400)
RBC: 3.22 MIL/uL — ABNORMAL LOW (ref 4.22–5.81)
RDW: 20.4 % — ABNORMAL HIGH (ref 11.5–15.5)
WBC: 6.9 10*3/uL (ref 4.0–10.5)
nRBC: 1.9 % — ABNORMAL HIGH (ref 0.0–0.2)

## 2023-05-11 LAB — RETICULOCYTES
Immature Retic Fract: 39.8 % — ABNORMAL HIGH (ref 2.3–15.9)
RBC.: 2.97 MIL/uL — ABNORMAL LOW (ref 4.22–5.81)
Retic Count, Absolute: 294 10*3/uL — ABNORMAL HIGH (ref 19.0–186.0)
Retic Ct Pct: 6.8 % — ABNORMAL HIGH (ref 0.4–3.1)

## 2023-05-11 MED ORDER — HYDROMORPHONE HCL 1 MG/ML IJ SOLN: 2.0000 mg | INTRAMUSCULAR | Status: DC

## 2023-05-11 MED ORDER — HYDROMORPHONE HCL 1 MG/ML IJ SOLN
2.0000 mg | INTRAMUSCULAR | Status: AC
  Administered 2023-05-11: 2 mg via INTRAVENOUS
  Filled 2023-05-11: qty 2

## 2023-05-11 MED ORDER — POTASSIUM CHLORIDE CRYS ER 20 MEQ PO TBCR
40.0000 meq | EXTENDED_RELEASE_TABLET | Freq: Once | ORAL | Status: AC
  Administered 2023-05-11: 40 meq via ORAL
  Filled 2023-05-11: qty 2

## 2023-05-11 MED ORDER — OXYCODONE-ACETAMINOPHEN 5-325 MG PO TABS
2.0000 | ORAL_TABLET | Freq: Once | ORAL | Status: AC
  Administered 2023-05-11: 2 via ORAL
  Filled 2023-05-11: qty 2

## 2023-05-11 MED ORDER — KETOROLAC TROMETHAMINE 15 MG/ML IJ SOLN: 15.0000 mg | INTRAMUSCULAR | Status: DC

## 2023-05-11 MED ORDER — ONDANSETRON HCL 4 MG/2ML IJ SOLN
4.0000 mg | INTRAMUSCULAR | Status: DC | PRN
  Administered 2023-05-11: 4 mg via INTRAVENOUS
  Filled 2023-05-11: qty 2

## 2023-05-11 MED ORDER — SODIUM CHLORIDE 0.45 % IV SOLN: INTRAVENOUS | Status: DC

## 2023-05-11 MED ORDER — HYDROMORPHONE HCL 1 MG/ML IJ SOLN
2.0000 mg | Freq: Once | INTRAMUSCULAR | Status: AC
  Administered 2023-05-11: 2 mg via INTRAVENOUS
  Filled 2023-05-11: qty 2

## 2023-05-11 NOTE — Telephone Encounter (Signed)
 Patient called requesting to come to the day hospital for sickle cell pain. Patient reports back pain rated 9/10. Reports taking Percocet 10 mg at 5:00 am. COVID-19 screening done and patient denies all symptoms and exposures. Denies fever, chest pain, nausea, vomiting, diarrhea, abdominal pain and priapism. Nita Sells, NP notified. The day hospital is now at capacity. Provider advised patient to continue to manage pain at home and call back in the morning if pain remains uncontrolled. Patient advised and expresses an understanding.

## 2023-05-11 NOTE — ED Provider Notes (Signed)
 [place]MENT AT [facility] Provider Note   CSN: [id number] Arrival date & time: 05/11/23  0915     History  Chief Complaint  Patient presents with  . Sickle Cell Pain Crisis    Timothy Marks is a 33 y.o. male.  Patient is a 33 year old male with a past medical history of sickle cell anemia, chronic pain, DVT on Eliquis presenting to the emergency department with sickle cell pain crisis.  Patient states he has had pain in his low back for the last several days.  States he has been managing it with Percocet at home and has not had significant relief.  States that he feels like his flares are triggered by the weather.  He denies any associated numbness or weakness, fevers or chills, dysuria or hematuria or any trauma or falls.  The history is provided by the patient.  Sickle Cell Pain Crisis      Home Medications Prior to Admission medications   Medication Sig Start Date End Date Taking? Authorizing Provider  apixaban (ELIQUIS) 5 MG TABS tablet Take 1 tablet (5 mg total) by mouth 2 (two) times daily. 03/20/23   Quentin Angst, MD  cholecalciferol (VITAMIN D3) 25 MCG (1000 UNIT) tablet Take 1 tablet (1,000 Units total) by mouth daily. 03/22/23   Donell Beers, FNP  diphenhydrAMINE (BENADRYL) 25 mg capsule Take 1 capsule (25 mg total) by mouth every 6 (six) hours as needed for itching. 01/17/23   Donell Beers, FNP  folic acid (FOLVITE) 1 MG tablet Take 1 tablet (1 mg total) by mouth daily. 03/21/23   Donell Beers, FNP  gabapentin (NEURONTIN) 300 MG capsule Take 1 capsule (300 mg total) by mouth 3 (three) times daily. 05/03/23   Donell Beers, FNP  hydrocortisone 1 % ointment Apply 1 Application topically 2 (two) times daily. Patient not taking: Reported on 02/27/2023 10/14/21   Quentin Angst, MD  hydroxyurea (HYDREA) 500 MG capsule Take 1 capsule (500 mg total) by mouth 2 (two) times daily. May take with food to minimize GI  side effects. 03/21/23   Donell Beers, FNP  hydrOXYzine (ATARAX) 25 MG tablet Take 1 tablet (25 mg total) by mouth daily as needed for anxiety or vomiting. 01/17/23   Donell Beers, FNP  ibuprofen (ADVIL) 800 MG tablet Take 1 tablet (800 mg total) by mouth every 8 (eight) hours as needed. 05/03/23   Paseda, Baird Kay, FNP  mirtazapine (REMERON) 7.5 MG tablet Take 1 tablet (7.5 mg total) by mouth at bedtime as needed (for sleep). 01/17/23   Donell Beers, FNP  naloxone (NARCAN) nasal spray 4 mg/0.1 mL Place 1 spray into the nose once. Patient not taking: Reported on 02/27/2023 09/04/22   [provider]  oxyCODONE-acetaminophen (PERCOCET) 10-325 MG tablet Take 1 tablet by mouth every 4 (four) hours as needed for up to 15 days for pain. 05/05/23 05/20/23  Donell Beers, FNP      Allergies    Buprenorphine hcl, Levaquin [levofloxacin], Meperidine, Morphine, Morphine and codeine, Tramadol, Vancomycin, and Zosyn [piperacillin sod-tazobactam so]    Review of Systems   Review of Systems  Physical Exam Updated Vital Signs BP 116/69   Pulse 71   Temp 97.8 F (36.6 C) (Oral)   Resp 16   Ht 6\' 2"  (1.88 m)   Wt 54.4 kg   SpO2 97%   BMI 15.41 kg/m  Physical Exam Vitals and nursing note reviewed.  Constitutional:  General: He is not in acute distress.    Appearance: Normal appearance.  HENT:     Head: Normocephalic and atraumatic.     Nose: Nose normal.     Mouth/Throat:     Mouth: Mucous membranes are moist.  Eyes:     Extraocular Movements: Extraocular movements intact.  Cardiovascular:     Rate and Rhythm: Normal rate.     Pulses: Normal pulses.  Pulmonary:     Effort: Pulmonary effort is normal.  Abdominal:     General: Abdomen is flat.  Musculoskeletal:        General: Normal range of motion.     Cervical back: Normal range of motion and neck supple.     Comments: No midline back tenderness Bilateral mid-thoracic to proximal lumbar paraspinal  muscle tenderness to palpation, no overlying skin changes  Skin:    General: Skin is warm and dry.  Neurological:     General: No focal deficit present.     Mental Status: He is alert and oriented to person, place, and time.     Sensory: No sensory deficit.     Motor: No weakness.  Psychiatric:        Mood and Affect: Mood normal.        Behavior: Behavior normal.     ED Results / Procedures / Treatments   Labs (all labs ordered are listed, but only abnormal results are displayed) Labs Reviewed  CBC WITH DIFFERENTIAL/PLATELET - Abnormal; Notable for the following components:      Result Value   RBC 3.22 (*)    Hemoglobin 10.0 (*)    HCT 28.6 (*)    RDW 20.4 (*)    Platelets 443 (*)    nRBC 1.9 (*)    All other components within normal limits  RETICULOCYTES - Abnormal; Notable for the following components:   Retic Ct Pct 6.8 (*)    RBC. 2.97 (*)    Retic Count, Absolute 294.0 (*)    Immature Retic Fract 39.8 (*)    All other components within normal limits  BASIC METABOLIC PANEL WITH GFR - Abnormal; Notable for the following components:   Potassium 3.3 (*)    Creatinine, Ser 0.52 (*)    All other components within normal limits    EKG None  Radiology No results found.  Procedures Procedures    Medications Ordered in ED Medications  0.45 % sodium chloride infusion ( Intravenous New Bag/Given 05/11/23 1047)  ondansetron (ZOFRAN) injection 4 mg (4 mg Intravenous Given 05/11/23 1047)  HYDROmorphone (DILAUDID) injection 2 mg (2 mg Intravenous Given 05/11/23 1047)  HYDROmorphone (DILAUDID) injection 2 mg (2 mg Intravenous Given 05/11/23 1147)  HYDROmorphone (DILAUDID) injection 2 mg (2 mg Intravenous Given 05/11/23 1115)  potassium chloride SA (KLOR-CON M) CR tablet 40 mEq (40 mEq Oral Given 05/11/23 1147)    ED Course/ Medical Decision Making/ A&P Clinical Course as of 05/11/23 1318  Thu May 11, 2023  1113 Mild hypokalemia, will be repleted. Hgb at baseline. [VK]   1203 Reticulocyte count appropriate. Has received his 3rd dose of dilaudid. Will reassess for disposition. [VK]  1217 Patient reports still having significant pain. Will be admitted for sickle cell pain crisis. [VK]  1221 I spoke with Purvis Sheffield NP who will come evaluate the patient. [VK]  1317 Patient evaluated by SS NP. States he does not meet admission criteria and symptoms consistent with chronic pain syndrome. Recommended discharge with outpatient follow up. [VK]    Clinical  Course User Index [VK] Rexford Maus, DO                                 Medical Decision Making This patient presents to the ED with chief complaint(s) of back pain with pertinent past medical history of SS anemia, chronic pain, prior DVT on Eliquis which further complicates the presenting complaint. The complaint involves an extensive differential diagnosis and also carries with it a high risk of complications and morbidity.    The differential diagnosis includes sickle cell pain crisis, chronic pain syndrome, no significant trauma making fracture unlikely, muscle strain or spasm, no neurologic deficits making cauda equina unlikely  Additional history obtained: Additional history obtained from N/A Records reviewed previous admission documents and outpatient hematology records  ED Course and Reassessment: On patient's arrival he is hemodynamically stable in no acute distress without any neurologic deficits.  All labs performed to evaluate for any worsening anemia or aplastic crisis and will be started on pain management.  Patient will be closely reassessed.  Independent labs interpretation:  The following labs were independently interpreted: at baseline  Independent visualization of imaging: - N/A  Consultation: - Consulted or discussed management/test interpretation w/ external professional: sickle cell  Consideration for admission or further workup: patient requires admission for sickle cell pain  crisis Social Determinants of health: N/A    Amount and/or Complexity of Data Reviewed Labs: ordered.  Risk Prescription drug management. Decision regarding hospitalization.          Final Clinical Impression(s) / ED Diagnoses Final diagnoses:  Sickle cell pain crisis [facility])    Rx / DC Orders ED Discharge Orders     None         Rexford Maus, DO 05/11/23 1218

## 2023-05-11 NOTE — Discharge Instructions (Signed)
 You were seen in the emergency department for your sickle cell pain.  Your labs are all within normal range.  You were seen by the sickle cell team who believes your symptoms are more consistent with chronic pain and recommend you continue to take your home pain medications as prescribed and can follow-up with them as an outpatient.  You can return to the emergency department if you have any new or concerning symptoms.

## 2023-05-11 NOTE — ED Triage Notes (Signed)
 Patient has had a sickle cell pain crisis for 2 days. Complaining of lower back pain. Took 1 percocet at 5am today.

## 2023-05-15 ENCOUNTER — Other Ambulatory Visit (HOSPITAL_COMMUNITY): Payer: Self-pay

## 2023-05-15 ENCOUNTER — Other Ambulatory Visit: Payer: Self-pay | Admitting: Nurse Practitioner

## 2023-05-15 DIAGNOSIS — D571 Sickle-cell disease without crisis: Secondary | ICD-10-CM

## 2023-05-15 DIAGNOSIS — G894 Chronic pain syndrome: Secondary | ICD-10-CM

## 2023-05-15 MED ORDER — OXYCODONE-ACETAMINOPHEN 10-325 MG PO TABS
1.0000 | ORAL_TABLET | ORAL | 0 refills | Status: DC | PRN
  Filled 2023-05-20: qty 90, 15d supply, fill #0

## 2023-05-15 NOTE — Progress Notes (Signed)
 Reviewed PDMP substance reporting system prior to prescribing opiate medications. No inconsistencies noted.   1. Sickle cell disease without crisis (HCC)  - oxyCODONE-acetaminophen (PERCOCET) 10-325 MG tablet; Take 1 tablet by mouth every 4 (four) hours as needed for up to 15 days for pain.  Dispense: 90 tablet; Refill: 0  2. Chronic pain syndrome  - oxyCODONE-acetaminophen (PERCOCET) 10-325 MG tablet; Take 1 tablet by mouth every 4 (four) hours as needed for up to 15 days for pain.  Dispense: 90 tablet; Refill: 0

## 2023-05-16 ENCOUNTER — Other Ambulatory Visit (HOSPITAL_COMMUNITY): Payer: Self-pay

## 2023-05-16 DIAGNOSIS — E559 Vitamin D deficiency, unspecified: Secondary | ICD-10-CM | POA: Diagnosis not present

## 2023-05-16 DIAGNOSIS — D571 Sickle-cell disease without crisis: Secondary | ICD-10-CM | POA: Diagnosis not present

## 2023-05-16 DIAGNOSIS — Z681 Body mass index (BMI) 19 or less, adult: Secondary | ICD-10-CM | POA: Diagnosis not present

## 2023-05-16 DIAGNOSIS — B009 Herpesviral infection, unspecified: Secondary | ICD-10-CM | POA: Diagnosis not present

## 2023-05-16 MED ORDER — ERGOCALCIFEROL 1.25 MG (50000 UT) PO CAPS
1.0000 | ORAL_CAPSULE | ORAL | 1 refills | Status: DC
  Filled 2023-05-16: qty 12, 84d supply, fill #0
  Filled 2023-08-13: qty 12, 84d supply, fill #1

## 2023-05-17 ENCOUNTER — Other Ambulatory Visit (HOSPITAL_COMMUNITY): Payer: Self-pay

## 2023-05-20 ENCOUNTER — Other Ambulatory Visit (HOSPITAL_COMMUNITY): Payer: Self-pay

## 2023-05-29 ENCOUNTER — Other Ambulatory Visit: Payer: Self-pay | Admitting: Nurse Practitioner

## 2023-05-29 ENCOUNTER — Other Ambulatory Visit (HOSPITAL_COMMUNITY): Payer: Self-pay

## 2023-05-29 DIAGNOSIS — D571 Sickle-cell disease without crisis: Secondary | ICD-10-CM

## 2023-05-29 DIAGNOSIS — G894 Chronic pain syndrome: Secondary | ICD-10-CM

## 2023-05-29 MED ORDER — OXYCODONE-ACETAMINOPHEN 10-325 MG PO TABS
1.0000 | ORAL_TABLET | ORAL | 0 refills | Status: DC | PRN
  Filled 2023-06-05: qty 90, 15d supply, fill #0

## 2023-05-29 NOTE — Telephone Encounter (Signed)
 Copied from CRM [id number]. Topic: Clinical - Medication Refill >> May 29, 2023  9:03 AM Lisa Rideau B wrote: Most Recent Primary Care Visit:  Provider: Freeda Jerry  Department: [facility]  Visit Type: MEDICARE AWV, SEQUENTIAL  Date: 05/04/2023  Medication: oxyCODONE-acetaminophen (PERCOCET) 10-325 MG tablet  Has the patient contacted their pharmacy? No because of type of med  Is this the correct pharmacy for this prescription? yes This is the patient's preferred pharmacy:  [facility] [address] [place] [address] Phone: [phone] Fax: [phone]   Has the prescription been filled recently? no  Is the patient out of the medication? yes  Has the patient been seen for an appointment in the last year OR does the patient have an upcoming appointment? yes  Can we respond through MyChart? yes  Agent: Please be advised that Rx refills may take up to 3 business days. We ask that you follow-up with your pharmacy.

## 2023-05-29 NOTE — Progress Notes (Signed)
 Reviewed PDMP substance reporting system prior to prescribing opiate medications. No inconsistencies noted.   1. Sickle cell disease without crisis (HCC)  - oxyCODONE-acetaminophen (PERCOCET) 10-325 MG tablet; Take 1 tablet by mouth every 4 (four) hours as needed for up to 15 days for pain.  Dispense: 90 tablet; Refill: 0  2. Chronic pain syndrome  - oxyCODONE-acetaminophen (PERCOCET) 10-325 MG tablet; Take 1 tablet by mouth every 4 (four) hours as needed for up to 15 days for pain.  Dispense: 90 tablet; Refill: 0

## 2023-05-30 ENCOUNTER — Telehealth (HOSPITAL_COMMUNITY): Payer: Self-pay

## 2023-05-30 ENCOUNTER — Ambulatory Visit: Payer: Self-pay

## 2023-05-30 ENCOUNTER — Telehealth: Payer: Self-pay | Admitting: Nurse Practitioner

## 2023-05-30 ENCOUNTER — Non-Acute Institutional Stay (HOSPITAL_COMMUNITY)
Admission: AD | Admit: 2023-05-30 | Discharge: 2023-05-30 | Disposition: A | Discharge status: Home / Self Care | Source: Ambulatory Visit | Attending: Internal Medicine | Admitting: Internal Medicine

## 2023-05-30 DIAGNOSIS — G894 Chronic pain syndrome: Secondary | ICD-10-CM | POA: Diagnosis not present

## 2023-05-30 DIAGNOSIS — D5700 Hb-SS disease with crisis, unspecified: Secondary | ICD-10-CM | POA: Diagnosis not present

## 2023-05-30 DIAGNOSIS — F32A Depression, unspecified: Secondary | ICD-10-CM | POA: Diagnosis not present

## 2023-05-30 DIAGNOSIS — F419 Anxiety disorder, unspecified: Secondary | ICD-10-CM | POA: Insufficient documentation

## 2023-05-30 DIAGNOSIS — D638 Anemia in other chronic diseases classified elsewhere: Secondary | ICD-10-CM | POA: Diagnosis not present

## 2023-05-30 LAB — CBC WITH DIFFERENTIAL/PLATELET
Abs Immature Granulocytes: 0.08 10*3/uL — ABNORMAL HIGH (ref 0.00–0.07)
Basophils Absolute: 0 10*3/uL (ref 0.0–0.1)
Basophils Relative: 0 %
Eosinophils Absolute: 0.4 10*3/uL (ref 0.0–0.5)
Eosinophils Relative: 4 %
HCT: 26.3 % — ABNORMAL LOW (ref 39.0–52.0)
Hemoglobin: 9.5 g/dL — ABNORMAL LOW (ref 13.0–17.0)
Immature Granulocytes: 1 %
Lymphocytes Relative: 28 %
Lymphs Abs: 2.7 10*3/uL (ref 0.7–4.0)
MCH: 32.4 pg (ref 26.0–34.0)
MCHC: 36.1 g/dL — ABNORMAL HIGH (ref 30.0–36.0)
MCV: 89.8 fL (ref 80.0–100.0)
Monocytes Absolute: 1 10*3/uL (ref 0.1–1.0)
Monocytes Relative: 11 %
Neutro Abs: 5.4 10*3/uL (ref 1.7–7.7)
Neutrophils Relative %: 56 %
Platelets: 411 10*3/uL — ABNORMAL HIGH (ref 150–400)
RBC: 2.93 MIL/uL — ABNORMAL LOW (ref 4.22–5.81)
RDW: 18.2 % — ABNORMAL HIGH (ref 11.5–15.5)
WBC: 9.6 10*3/uL (ref 4.0–10.5)
nRBC: 3.1 % — ABNORMAL HIGH (ref 0.0–0.2)

## 2023-05-30 LAB — COMPREHENSIVE METABOLIC PANEL WITH GFR
ALT: 12 U/L (ref 0–44)
AST: 33 U/L (ref 15–41)
Albumin: 4.4 g/dL (ref 3.5–5.0)
Alkaline Phosphatase: 59 U/L (ref 38–126)
Anion gap: 8 (ref 5–15)
BUN: 7 mg/dL (ref 6–20)
CO2: 24 mmol/L (ref 22–32)
Calcium: 9.1 mg/dL (ref 8.9–10.3)
Chloride: 103 mmol/L (ref 98–111)
Creatinine, Ser: 0.67 mg/dL (ref 0.61–1.24)
GFR, Estimated: >60 mL/min (ref >60)
Glucose, Bld: 114 mg/dL — ABNORMAL HIGH (ref 70–99)
Potassium: 3.3 mmol/L — ABNORMAL LOW (ref 3.5–5.1)
Sodium: 135 mmol/L (ref 135–145)
Total Bilirubin: 3.8 mg/dL — ABNORMAL HIGH (ref 0.0–1.2)
Total Protein: 8.1 g/dL (ref 6.5–8.1)

## 2023-05-30 LAB — RETICULOCYTES
Immature Retic Fract: 37.9 % — ABNORMAL HIGH (ref 2.3–15.9)
RBC.: 3.02 MIL/uL — ABNORMAL LOW (ref 4.22–5.81)
Retic Count, Absolute: 295.5 10*3/uL — ABNORMAL HIGH (ref 19.0–186.0)
Retic Ct Pct: 9.2 % — ABNORMAL HIGH (ref 0.4–3.1)

## 2023-05-30 LAB — LACTATE DEHYDROGENASE: LDH: 285 U/L — ABNORMAL HIGH (ref 98–192)

## 2023-05-30 MED ORDER — SENNOSIDES-DOCUSATE SODIUM 8.6-50 MG PO TABS
1.0000 | ORAL_TABLET | Freq: Two times a day (BID) | ORAL | Status: DC
  Filled 2023-05-30: qty 1

## 2023-05-30 MED ORDER — NALOXONE HCL 0.4 MG/ML IJ SOLN: 0.4000 mg | INTRAMUSCULAR | Status: DC | PRN

## 2023-05-30 MED ORDER — SODIUM CHLORIDE 0.9% FLUSH: 9.0000 mL | INTRAVENOUS | Status: DC | PRN

## 2023-05-30 MED ORDER — DIPHENHYDRAMINE HCL 25 MG PO CAPS
25.0000 mg | ORAL_CAPSULE | ORAL | Status: DC | PRN
  Administered 2023-05-30 (×2): 25 mg via ORAL
  Filled 2023-05-30 (×2): qty 1

## 2023-05-30 MED ORDER — POLYETHYLENE GLYCOL 3350 17 G PO PACK: 17.0000 g | PACK | Freq: Every day | ORAL | Status: DC | PRN

## 2023-05-30 MED ORDER — HYDROMORPHONE 1 MG/ML IV SOLN
INTRAVENOUS | Status: DC
  Administered 2023-05-30: 30 mg via INTRAVENOUS
  Administered 2023-05-30: 9.5 mg via INTRAVENOUS
  Filled 2023-05-30: qty 30

## 2023-05-30 MED ORDER — SODIUM CHLORIDE 0.45 % IV SOLN: INTRAVENOUS | Status: DC

## 2023-05-30 MED ORDER — ACETAMINOPHEN 500 MG PO TABS
1000.0000 mg | ORAL_TABLET | Freq: Once | ORAL | Status: AC
  Administered 2023-05-30: 1000 mg via ORAL
  Filled 2023-05-30: qty 2

## 2023-05-30 MED ORDER — ONDANSETRON HCL 4 MG/2ML IJ SOLN
4.0000 mg | Freq: Four times a day (QID) | INTRAMUSCULAR | Status: DC | PRN
  Administered 2023-05-30: 4 mg via INTRAVENOUS
  Filled 2023-05-30: qty 2

## 2023-05-30 MED ORDER — KETOROLAC TROMETHAMINE 15 MG/ML IJ SOLN
15.0000 mg | Freq: Four times a day (QID) | INTRAMUSCULAR | Status: DC
  Administered 2023-05-30: 15 mg via INTRAVENOUS
  Filled 2023-05-30 (×2): qty 1

## 2023-05-30 NOTE — Telephone Encounter (Signed)
 First attempt to return pt's call.   Left a voicemail to call back.  Pt. Requesting to come in today for a sickle cell crisis to the sickle cell clinic.

## 2023-05-30 NOTE — H&P (Signed)
 [facility] History and Physical  Timothy Marks ION:[id number] DOB: [birth year] DOA: 05/30/2023  PCP: Donell Beers, FNP   Chief Complaint:  Chief Complaint  Patient presents with   Sickle Cell Pain Crisis    HPI: Timothy Marks is a 33 y.o. male with history of sickle cell disease, depression with anxiety, anemia of chronic disease, chronic pain syndrome among other things who presents to the [facility] complaining of pain rated as 9/ 10 in his lower back that is consistent with his previous sickle cell pain crisis.  Patient states that pain intensity has been elevating over the past several days. Patient takes Percocet and morphine and took some today but no relief. Patient attributes pain to change in weather.  Denied any fever, chills, nausea vomiting or diarrhea. No recent travels or sick contacts.   Systemic Review: General: The patient denies anorexia, fever, weight loss Cardiac: Denies chest pain, syncope, palpitations, pedal edema  Respiratory: Denies cough, shortness of breath, wheezing GI: Denies severe indigestion/heartburn, abdominal pain, nausea, vomiting, diarrhea and constipation GU: Denies hematuria, incontinence, dysuria  Musculoskeletal: Lower extremity pain /Lower back pain  Skin: Denies suspicious skin lesions Neurologic: Denies focal weakness or numbness, change in vision  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   Sickle cell anemia (HCC)    Vitamin D deficiency 2015    Past Surgical History:  Procedure Laterality Date   CHOLECYSTECTOMY      Allergies  Allergen Reactions   Buprenorphine Hcl Hives   Levaquin [Levofloxacin] Itching   Meperidine Rash   Morphine Hives    Takes MS IR at home   Morphine And Codeine Hives     Takes MSIR PTA   Tramadol Hives   Vancomycin Itching   Zosyn [Piperacillin Sod-Tazobactam So] Itching, Rash and Other (See Comments)    Has taken rocephin in the past    Family History  Problem Relation Age of Onset   Sickle cell trait Mother    Sickle cell trait Father       Prior to Admission medications   Medication Sig Start Date End Date Taking? Authorizing Provider  apixaban (ELIQUIS) 5 MG TABS tablet Take 1 tablet (5 mg total) by mouth 2 (two) times daily. 03/20/23   Quentin Angst, MD  cholecalciferol (VITAMIN D3) 25 MCG (1000 UNIT) tablet Take 1 tablet (1,000 Units total) by mouth daily. 03/22/23   Donell Beers, FNP  diphenhydrAMINE (BENADRYL) 25 mg capsule Take 1 capsule (25 mg total) by mouth every 6 (six) hours as needed for itching. 01/17/23   Donell Beers, FNP  ergocalciferol (VITAMIN D2) 1.25 MG (50000 UT) capsule Take 1 capsule (50,000 Units total) by mouth once a week. 05/16/23     folic acid (FOLVITE) 1 MG tablet Take 1 tablet (1 mg total) by mouth daily. 03/21/23   Donell Beers, FNP  gabapentin (NEURONTIN) 300 MG capsule Take 1 capsule (300 mg total) by mouth 3 (three) times daily. Patient not taking: Reported on 05/11/2023 05/03/23   Donell Beers, FNP  hydrocortisone 1 % ointment Apply 1 Application topically 2 (two) times daily. 10/14/21   Quentin Angst, MD  hydroxyurea (HYDREA) 500 MG capsule Take 1 capsule (500 mg total) by mouth 2 (two) times daily.  May take with food to minimize GI side effects. 03/21/23   Donell Beers, FNP  hydrOXYzine (ATARAX) 25 MG tablet Take 1 tablet (25 mg total) by mouth daily as needed for anxiety or vomiting. 01/17/23   Donell Beers, FNP  ibuprofen (ADVIL) 800 MG tablet Take 1 tablet (800 mg total) by mouth every 8 (eight) hours as needed. 05/03/23   Paseda, Baird Kay, FNP  mirtazapine (REMERON) 7.5 MG tablet Take 1 tablet (7.5 mg total) by mouth at bedtime as needed (for sleep). 01/17/23   Donell Beers, FNP  naloxone (NARCAN) nasal spray 4 mg/0.1 mL Place 1 spray into the nose once. 09/04/22   [provider]  oxyCODONE-acetaminophen (PERCOCET) 10-325 MG tablet Take 1 tablet by mouth every 4 (four) hours as needed for up to 15 days for pain. 06/04/23 06/19/23  Donell Beers, FNP     Physical Exam: Vitals:   05/30/23 1101 05/30/23 1318 05/30/23 1427 05/30/23 1428  BP: 116/74 122/75 122/73   Pulse: 88 83 96   Resp: 14 14 16 16   Temp:      TempSrc:      SpO2: 96% 95% 91%     General: Alert, awake, afebrile, anicteric, not in obvious distress HEENT: Normocephalic and Atraumatic, Mucous membranes pink                PERRLA; EOM intact; No scleral icterus,                 Nares: Patent, Oropharynx: Clear, Fair Dentition                 Neck: FROM, no cervical lymphadenopathy, thyromegaly, carotid bruit or JVD;  CHEST WALL: No tenderness  CHEST: Normal respiration, clear to auscultation bilaterally  HEART: Regular rate and rhythm; no murmurs rubs or gallops  BACK: No kyphosis or scoliosis; no CVA tenderness  ABDOMEN: Positive Bowel Sounds, soft, non-tender; no masses, no organomegaly EXTREMITIES: No cyanosis, clubbing, or edema SKIN:  no rash or ulceration  CNS: Alert and Oriented x 4, Nonfocal exam, CN 2-12 intact  Labs on Admission:  Basic Metabolic Panel: Recent Labs  Lab 05/30/23 0925  NA 135  K 3.3*  CL 103  CO2 24  GLUCOSE 114*  BUN 7  CREATININE 0.67  CALCIUM 9.1   Liver Function Tests: Recent Labs  Lab 05/30/23 0925  AST 33  ALT 12  ALKPHOS 59  BILITOT 3.8*  PROT 8.1  ALBUMIN 4.4   No results for input(s): "LIPASE", "AMYLASE" in the last 168 hours. No results for input(s): "AMMONIA" in the last 168 hours. CBC: Recent Labs  Lab 05/30/23 0925  WBC 9.6  NEUTROABS 5.4  HGB 9.5*  HCT 26.3*  MCV 89.8  PLT 411*   Cardiac Enzymes: No results for input(s): "CKTOTAL", "CKMB", "CKMBINDEX", "TROPONINI" in the last 168 hours.  BNP  (last 3 results) No results for input(s): "BNP" in the last 8760 hours.  ProBNP (last 3 results) No results for input(s): "PROBNP" in the last 8760 hours.  CBG: No results for input(s): "GLUCAP" in the last 168 hours.   Assessment/Plan Principal Problem:   Sickle cell anemia with pain (HCC)  Admits to the [facility] Hospital for extended observation IVF 0.45% Saline @ 125 mls/hour Weight based Dilaudid PCA started within 30 minutes of admission IV Toradol 15 mg X 1 doses Acetaminophen 1000 mg x 1 dose Labs: CBCD, CMP, Retic Count and LDH Monitor vitals very closely, Re-evaluate pain scale  every hour 2 L of Oxygen by [place] Patient will be re-evaluated for pain in the context of function and relationship to baseline as care progresses. If no significant relieve from pain (remains above 5/10) will transfer patient to inpatient services for further evaluation and management  Code Status: Full  Family Communication: None  DVT Prophylaxis: Ambulate as tolerated   Time spent: 35 Minutes  Lorel Roes NP  If 7PM-7AM, please contact night-coverage [email or web address] 05/30/2023, 3:40 PM

## 2023-05-30 NOTE — Telephone Encounter (Signed)
 Second attempt to return his call.   I have attempted to call the [facility] twice on the CAL line.   I keep getting the message that this line is not available.   It transfers me to another line to leave a voicemail.   No voicemail left as I don't know who or where this voicemail is.    Will route to call back basket for further attempts.

## 2023-05-30 NOTE — Telephone Encounter (Signed)
 Contacted patient via Mychart and gave him the number to the day hospital so he can be triaged for his pain crisis. Patient replied and has called the day hospital as advised

## 2023-05-30 NOTE — Telephone Encounter (Signed)
 Chief Complaint: Back pain  Symptoms: Back pain / sickle cell flare up Frequency: since last night Pertinent Negatives: Patient denies pain with urination, pain down legs, pain in abdomen Disposition: [] ED /[] Urgent Care (no appt availability in office) / [x] Appointment(In office/virtual)/ []  [facility]/ [] Home Care/ [x] Refused Recommended Disposition /[] [facility]/ []  Follow-up with PCP Additional Notes: Patient called in stating he is having a sickle cell pain flare up across his lower back. Patient states he took OTC pain medicine but it is barely working and he is in a 10/10 pain. This RN advised there is not appt availability for today for his clinic and recommended the [facility] to patient. Patient stated "No, I want to come to the [facility] at [address] today! That is where I always go! Check my record". This RN advised patient there is not appt availability but immediate assistance can be provided at the [facility]. This RN attempted CAL phone number but no answer, believed to not be open for the day yet. Patient would like a call back by RN at [phone] to connect to CAL for assistance in seeing if he can get an appt today at [facility]) - stating he has always been able to come in same day. Will place in call back folders.    Copied from CRM [id number]. Topic: Clinical - Red Word Triage >> May 30, 2023  7:33 AM Timothy Marks wrote: [facility] that prompted transfer to Nurse Triage: back pain Reason for Disposition  [1] SEVERE back pain (e.g., excruciating, unable to do any normal activities) AND [2] not improved 2 hours after pain medicine  Answer Assessment - Initial Assessment Questions 1. ONSET: "When did the pain begin?"      Last night 2. LOCATION: "Where does it hurt?" (upper, mid or lower back)     Right above tailbone across the back horizontally 3. SEVERITY: "How bad is the pain?"  (e.g.,  Scale 1-10; mild, moderate, or severe)   - MILD (1-3): Doesn't interfere with normal activities.    - MODERATE (4-7): Interferes with normal activities or awakens from sleep.    - SEVERE (8-10): Excruciating pain, unable to do any normal activities.      10 4. PATTERN: "Is the pain constant?" (e.g., yes, no; constant, intermittent)      Constant 5. RADIATION: "Does the pain shoot into your legs or somewhere else?"     No 6. CAUSE:  "What do you think is causing the back pain?"      No 7. BACK OVERUSE:  "Any recent lifting of heavy objects, strenuous work or exercise?"     No 8. MEDICINES: "What have you taken so far for the pain?" (e.g., nothing, acetaminophen, NSAIDS)     OTC pain medicine around 0530 this morning 9. NEUROLOGIC SYMPTOMS: "Do you have any weakness, numbness, or problems with bowel/bladder control?"     No 10. OTHER SYMPTOMS: "Do you have any other symptoms?" (e.g., fever, abdomen pain, burning with urination, blood in urine)       No  Protocols used: Back Pain-A-AH

## 2023-05-30 NOTE — Discharge Summary (Signed)
 Physician Discharge Summary  Timothy Marks MVH:[id number] DOB: July 21, 1990 DOA: 05/30/2023  PCP: Donell Beers, FNP  Admit date: 05/30/2023  Discharge date: 05/30/2023  Time spent: 30 minutes  Discharge Diagnoses:  Principal Problem:   Sickle cell anemia with pain [facility])   Discharge Condition: Stable  Diet recommendation: Regular  History of present illness:   Timothy Marks is a 33 y.o. male with history of sickle cell disease, depression with anxiety, anemia of chronic disease, chronic pain syndrome among other things who presents to the sickle cell day hospital complaining of pain rated as 9/ 10 in his lower back that is consistent with his previous sickle cell pain crisis.  Patient states that pain intensity has been elevating over the past several days. Patient takes Percocet and morphine and took some today but no relief. Patient attributes pain to change in weather.  Denied any fever, chills, nausea vomiting or diarrhea. No recent travels or sick contacts.   Hospital Course:  Timothy Marks was admitted to the day hospital with sickle cell painful crisis. Patient was treated with IV fluid, weight based IV Dilaudid PCA, IV Toradol, clinician assisted doses as deemed appropriate, and other adjunct therapies per sickle cell pain management protocol. Timothy Marks showed significant improvement symptomatically, pain improved from 9/10 to 6/10 at the time of discharge. Patient was discharged home in a hemodynamically stable condition. Timothy Marks will follow-up at the clinic as previously scheduled, continue with home medications as per prior to admission.  Discharge Instructions We discussed the need for good hydration, monitoring of hydration status, avoidance of heat, cold, stress, and infection triggers. We discussed the need to be compliant with taking Hydrea and other home medications. Timothy Marks was reminded of the need to seek medical attention immediately if any symptom of bleeding,  anemia, or infection occurs.  Discharge Exam: Vitals:   05/30/23 1427 05/30/23 1428  BP: 122/73   Pulse: 96   Resp: 16 16  Temp:    SpO2: 91%     General appearance: alert, cooperative and no distress Eyes: conjunctivae/corneas clear. PERRL, EOM's intact. Fundi benign. Neck: no adenopathy, no carotid bruit, no JVD, supple, symmetrical, trachea midline and thyroid not enlarged, symmetric, no tenderness/mass/nodules Back: symmetric, no curvature. ROM normal. No CVA tenderness. Resp: clear to auscultation bilaterally Chest wall: no tenderness Cardio: regular rate and rhythm, S1, S2 normal, no murmur, click, rub or gallop GI: soft, non-tender; bowel sounds normal; no masses,  no organomegaly Extremities: extremities normal, atraumatic, no cyanosis or edema Pulses: 2+ and symmetric Skin: Skin color, texture, turgor normal. No rashes or lesions Neurologic: Grossly normal  Discharge Instructions     Call MD for:  severe uncontrolled pain   Complete by: As directed    Call MD for:  temperature >100.4   Complete by: As directed    Diet - low sodium heart healthy   Complete by: As directed    Increase activity slowly   Complete by: As directed       Allergies as of 05/30/2023       Reactions   Buprenorphine Hcl Hives   Levaquin [levofloxacin] Itching   Meperidine Rash   Morphine Hives   Takes MS IR at home   Morphine And Codeine Hives   Takes MSIR PTA   Tramadol Hives   Vancomycin Itching   Zosyn [piperacillin Sod-tazobactam So] Itching, Rash, Other (See Comments)   Has taken rocephin in the past        Medication List  TAKE these medications    diphenhydrAMINE 25 mg capsule Commonly known as: BENADRYL Take 1 capsule (25 mg total) by mouth every 6 (six) hours as needed for itching.   Eliquis 5 MG Tabs tablet Generic drug: apixaban Take 1 tablet (5 mg total) by mouth 2 (two) times daily.   folic acid 1 MG tablet Commonly known as: FOLVITE Take 1 tablet (1  mg total) by mouth daily.   gabapentin 300 MG capsule Commonly known as: NEURONTIN Take 1 capsule (300 mg total) by mouth 3 (three) times daily.   hydrocortisone 1 % ointment Apply 1 Application topically 2 (two) times daily.   hydroxyurea 500 MG capsule Commonly known as: HYDREA Take 1 capsule (500 mg total) by mouth 2 (two) times daily. May take with food to minimize GI side effects.   hydrOXYzine 25 MG tablet Commonly known as: ATARAX Take 1 tablet (25 mg total) by mouth daily as needed for anxiety or vomiting.   ibuprofen 800 MG tablet Commonly known as: ADVIL Take 1 tablet (800 mg total) by mouth every 8 (eight) hours as needed.   mirtazapine 7.5 MG tablet Commonly known as: REMERON Take 1 tablet (7.5 mg total) by mouth at bedtime as needed (for sleep).   naloxone 4 MG/0.1ML Liqd nasal spray kit Commonly known as: NARCAN Place 1 spray into the nose once.   oxyCODONE-acetaminophen 10-325 MG tablet Commonly known as: PERCOCET Take 1 tablet by mouth every 4 (four) hours as needed for up to 15 days for pain. Start taking on: June 04, 2023   Vitamin D (Ergocalciferol) 1.25 MG (50000 UNIT) Caps capsule Commonly known as: DRISDOL Take 1 capsule (50,000 Units total) by mouth once a week.   vitamin D3 25 MCG tablet Commonly known as: CHOLECALCIFEROL Take 1 tablet (1,000 Units total) by mouth daily.       Allergies  Allergen Reactions   Buprenorphine Hcl Hives   Levaquin [Levofloxacin] Itching   Meperidine Rash   Morphine Hives    Takes MS IR at home   Morphine And Codeine Hives    Takes MSIR PTA   Tramadol Hives   Vancomycin Itching   Zosyn [Piperacillin Sod-Tazobactam So] Itching, Rash and Other (See Comments)    Has taken rocephin in the past     Significant Diagnostic Studies: No results found.  Signed:  Lorel Roes NP  05/30/2023, 3:41 PM

## 2023-05-30 NOTE — Telephone Encounter (Signed)
 Pt called the day hospital wanting to come in today for sickle cell pain treatment. Patient reports 10/10 pain to his lower back. Per pt he took Percocet 10 mg at 5 AM, and this was his last one. Per pt meds not due for refill until Friday or Saturday, and he says he has requested refill already. Denies being to ER. Screened for COVID and denies symptoms and exposures. Pt denies fever, chest pain, N/V/D, abdominal pain, and priapism. Marylu Soda, NP notified. Per provider pt can come to the day hospital today. Pt notified and verbalized understanding. Per pt his girlfriend will be his transportation today.

## 2023-05-30 NOTE — Progress Notes (Signed)
 Pt admitted to the day hospital today for sickle cell pain treatment. On arrival, pt rates pain 10/10 pain to back and legs. Pt received Dilaudid PCA, IV Toradol, IV Zofran, and hydrated with IV fluids via PIV. Pt also received PO Tylenol and Benadryl. At discharge, pt rates pain 5/10. AVS printed and given to pt. Pt is alert, oriented, and ambulatory at discharge.

## 2023-05-30 NOTE — Telephone Encounter (Signed)
 I will close out this encounter.   Pt has been contacted by the [facility] and is being admitted for treatment today.

## 2023-06-02 ENCOUNTER — Encounter (HOSPITAL_COMMUNITY): Payer: Self-pay | Admitting: *Deleted

## 2023-06-02 ENCOUNTER — Emergency Department (HOSPITAL_COMMUNITY)
Admission: EM | Admit: 2023-06-02 | Discharge: 2023-06-02 | Disposition: A | Discharge status: Home / Self Care | Attending: Emergency Medicine | Admitting: Emergency Medicine

## 2023-06-02 DIAGNOSIS — D5700 Hb-SS disease with crisis, unspecified: Secondary | ICD-10-CM | POA: Insufficient documentation

## 2023-06-02 DIAGNOSIS — Z7901 Long term (current) use of anticoagulants: Secondary | ICD-10-CM | POA: Insufficient documentation

## 2023-06-02 DIAGNOSIS — M79604 Pain in right leg: Secondary | ICD-10-CM | POA: Diagnosis present

## 2023-06-02 LAB — CBC WITH DIFFERENTIAL/PLATELET
Abs Immature Granulocytes: 0.03 10*3/uL (ref 0.00–0.07)
Basophils Absolute: 0 10*3/uL (ref 0.0–0.1)
Basophils Relative: 1 %
Eosinophils Absolute: 0.2 10*3/uL (ref 0.0–0.5)
Eosinophils Relative: 2 %
HCT: 23 % — ABNORMAL LOW (ref 39.0–52.0)
Hemoglobin: 8.2 g/dL — ABNORMAL LOW (ref 13.0–17.0)
Immature Granulocytes: 0 %
Lymphocytes Relative: 37 %
Lymphs Abs: 3.1 10*3/uL (ref 0.7–4.0)
MCH: 32.7 pg (ref 26.0–34.0)
MCHC: 35.7 g/dL (ref 30.0–36.0)
MCV: 91.6 fL (ref 80.0–100.0)
Monocytes Absolute: 0.9 10*3/uL (ref 0.1–1.0)
Monocytes Relative: 11 %
Neutro Abs: 4 10*3/uL (ref 1.7–7.7)
Neutrophils Relative %: 49 %
Platelets: 357 10*3/uL (ref 150–400)
RBC: 2.51 MIL/uL — ABNORMAL LOW (ref 4.22–5.81)
RDW: 21.2 % — ABNORMAL HIGH (ref 11.5–15.5)
WBC: 8.3 10*3/uL (ref 4.0–10.5)
nRBC: 5.1 % — ABNORMAL HIGH (ref 0.0–0.2)

## 2023-06-02 LAB — COMPREHENSIVE METABOLIC PANEL WITH GFR
ALT: 11 U/L (ref 0–44)
AST: 32 U/L (ref 15–41)
Albumin: 4 g/dL (ref 3.5–5.0)
Alkaline Phosphatase: 49 U/L (ref 38–126)
Anion gap: 9 (ref 5–15)
BUN: 8 mg/dL (ref 6–20)
CO2: 22 mmol/L (ref 22–32)
Calcium: 8.8 mg/dL — ABNORMAL LOW (ref 8.9–10.3)
Chloride: 108 mmol/L (ref 98–111)
Creatinine, Ser: 0.32 mg/dL — ABNORMAL LOW (ref 0.61–1.24)
GFR, Estimated: >60 mL/min (ref >60)
Glucose, Bld: 96 mg/dL (ref 70–99)
Potassium: 3.9 mmol/L (ref 3.5–5.1)
Sodium: 139 mmol/L (ref 135–145)
Total Bilirubin: 4.2 mg/dL — ABNORMAL HIGH (ref 0.0–1.2)
Total Protein: 7.1 g/dL (ref 6.5–8.1)

## 2023-06-02 LAB — RETICULOCYTES
Immature Retic Fract: 42.9 % — ABNORMAL HIGH (ref 2.3–15.9)
RBC.: 2.5 MIL/uL — ABNORMAL LOW (ref 4.22–5.81)
Retic Count, Absolute: 283 10*3/uL — ABNORMAL HIGH (ref 19.0–186.0)
Retic Ct Pct: 13.5 % — ABNORMAL HIGH (ref 0.4–3.1)

## 2023-06-02 MED ORDER — HYDROMORPHONE HCL 1 MG/ML IJ SOLN
2.0000 mg | INTRAMUSCULAR | Status: AC
  Administered 2023-06-02: 2 mg via INTRAVENOUS
  Filled 2023-06-02: qty 2

## 2023-06-02 MED ORDER — SODIUM CHLORIDE 0.45 % IV SOLN: INTRAVENOUS | Status: DC

## 2023-06-02 MED ORDER — HYDROMORPHONE HCL 1 MG/ML IJ SOLN
2.0000 mg | INTRAMUSCULAR | Status: DC
  Filled 2023-06-02: qty 2

## 2023-06-02 MED ORDER — HYDROMORPHONE HCL 1 MG/ML IJ SOLN: 2.0000 mg | INTRAMUSCULAR | Status: DC

## 2023-06-02 MED ORDER — OXYCODONE-ACETAMINOPHEN 5-325 MG PO TABS
2.0000 | ORAL_TABLET | Freq: Once | ORAL | Status: AC
  Administered 2023-06-02: 2 via ORAL
  Filled 2023-06-02: qty 2

## 2023-06-02 MED ORDER — SODIUM CHLORIDE 0.9 % IV SOLN
12.5000 mg | Freq: Once | INTRAVENOUS | Status: AC
  Administered 2023-06-02: 12.5 mg via INTRAVENOUS
  Filled 2023-06-02: qty 12.5

## 2023-06-02 MED ORDER — ONDANSETRON HCL 4 MG/2ML IJ SOLN
4.0000 mg | INTRAMUSCULAR | Status: DC | PRN
  Administered 2023-06-02: 4 mg via INTRAVENOUS
  Filled 2023-06-02: qty 2

## 2023-06-02 MED ORDER — HYDROMORPHONE HCL 1 MG/ML IJ SOLN
2.0000 mg | INTRAMUSCULAR | Status: DC
  Administered 2023-06-02: 2 mg via INTRAVENOUS

## 2023-06-02 MED ORDER — KETOROLAC TROMETHAMINE 15 MG/ML IJ SOLN
15.0000 mg | INTRAMUSCULAR | Status: AC
  Administered 2023-06-02: 15 mg via INTRAVENOUS
  Filled 2023-06-02: qty 1

## 2023-06-02 NOTE — ED Provider Notes (Signed)
 [place] EMERGENCY DEPARTMENT AT [facility] Provider Note   CSN: [id number] Arrival date & time: 06/02/23  1202     History  Chief Complaint  Patient presents with   Sickle Cell Pain Crisis    Timothy Marks is a 33 y.o. male.  33 year old male with history of sickle disease presents with acute vaso-occlusive crisis.  Patient states the pain is in his back and legs.  Was seen at the sickle cell clinic days ago for similar symptoms.  Was treated with Dilaudid  and felt well to go home.  States that he has been compliant with his home medications but denies any fever, cough, congestion.  No urinary changes.  Thinks the weather might be the inciting cause.       Home Medications Prior to Admission medications   Medication Sig Start Date End Date Taking? Authorizing Provider  apixaban  (ELIQUIS ) 5 MG TABS tablet Take 1 tablet (5 mg total) by mouth 2 (two) times daily. 03/20/23   Jegede, Olugbemiga E, MD  cholecalciferol  (VITAMIN D3) 25 MCG (1000 UNIT) tablet Take 1 tablet (1,000 Units total) by mouth daily. 03/22/23   Paseda, Folashade R, FNP  diphenhydrAMINE  (BENADRYL ) 25 mg capsule Take 1 capsule (25 mg total) by mouth every 6 (six) hours as needed for itching. 01/17/23   Paseda, Folashade R, FNP  ergocalciferol  (VITAMIN D2) 1.25 MG (50000 UT) capsule Take 1 capsule (50,000 Units total) by mouth once a week. 05/16/23     folic acid  (FOLVITE ) 1 MG tablet Take 1 tablet (1 mg total) by mouth daily. 03/21/23   Paseda, Folashade R, FNP  gabapentin  (NEURONTIN ) 300 MG capsule Take 1 capsule (300 mg total) by mouth 3 (three) times daily. Patient not taking: Reported on 05/11/2023 05/03/23   Paseda, Folashade R, FNP  hydrocortisone  1 % ointment Apply 1 Application topically 2 (two) times daily. 10/14/21   Jegede, Olugbemiga E, MD  hydroxyurea  (HYDREA ) 500 MG capsule Take 1 capsule (500 mg total) by mouth 2 (two) times daily. May take with food to minimize GI side effects. 03/21/23   Paseda,  Folashade R, FNP  hydrOXYzine  (ATARAX ) 25 MG tablet Take 1 tablet (25 mg total) by mouth daily as needed for anxiety or vomiting. 01/17/23   Paseda, Folashade R, FNP  ibuprofen  (ADVIL ) 800 MG tablet Take 1 tablet (800 mg total) by mouth every 8 (eight) hours as needed. 05/03/23   Paseda, Folashade R, FNP  mirtazapine  (REMERON ) 7.5 MG tablet Take 1 tablet (7.5 mg total) by mouth at bedtime as needed (for sleep). 01/17/23   Paseda, Folashade R, FNP  naloxone  (NARCAN ) nasal spray 4 mg/0.1 mL Place 1 spray into the nose once. 09/04/22   [provider]  oxyCODONE -acetaminophen  (PERCOCET) 10-325 MG tablet Take 1 tablet by mouth every 4 (four) hours as needed for up to 15 days for pain. 06/04/23 06/19/23  Paseda, Folashade R, FNP      Allergies    Buprenorphine hcl, Levaquin  [levofloxacin ], Meperidine, Morphine , Morphine  and codeine, Tramadol, Vancomycin, and Zosyn [piperacillin sod-tazobactam so]    Review of Systems   Review of Systems  All other systems reviewed and are negative.   Physical Exam Updated Vital Signs BP 123/81 (BP Location: Left Arm)   Pulse 88   Temp 98 F (36.7 C) (Oral)   Resp 17   Wt 54.5 kg   SpO2 100%   BMI 15.44 kg/m  Physical Exam Vitals and nursing note reviewed.  Constitutional:  General: He is not in acute distress.    Appearance: Normal appearance. He is well-developed. He is not toxic-appearing.  HENT:     Head: Normocephalic and atraumatic.  Eyes:     General: Lids are normal.     Conjunctiva/sclera: Conjunctivae normal.     Pupils: Pupils are equal, round, and reactive to light.  Neck:     Thyroid : No thyroid  mass.     Trachea: No tracheal deviation.  Cardiovascular:     Rate and Rhythm: Normal rate and regular rhythm.     Heart sounds: Normal heart sounds. No murmur heard.    No gallop.  Pulmonary:     Effort: Pulmonary effort is normal. No respiratory distress.     Breath sounds: Normal breath sounds. No stridor. No decreased breath  sounds, wheezing, rhonchi or rales.  Abdominal:     General: There is no distension.     Palpations: Abdomen is soft.     Tenderness: There is no abdominal tenderness. There is no rebound.  Musculoskeletal:        General: No tenderness. Normal range of motion.     Cervical back: Normal range of motion and neck supple.  Skin:    General: Skin is warm and dry.     Findings: No abrasion or rash.  Neurological:     Mental Status: He is alert and oriented to person, place, and time. Mental status is at baseline.     GCS: GCS eye subscore is 4. GCS verbal subscore is 5. GCS motor subscore is 6.     Cranial Nerves: No cranial nerve deficit.     Sensory: No sensory deficit.     Motor: Motor function is intact.  Psychiatric:        Attention and Perception: Attention normal.        Speech: Speech normal.        Behavior: Behavior normal.     ED Results / Procedures / Treatments   Labs (all labs ordered are listed, but only abnormal results are displayed) Labs Reviewed  COMPREHENSIVE METABOLIC PANEL WITH GFR  CBC WITH DIFFERENTIAL/PLATELET  RETICULOCYTES    EKG None  Radiology No results found.  Procedures Procedures    Medications Ordered in ED Medications  0.45 % sodium chloride  infusion (has no administration in time range)  ketorolac  (TORADOL ) 15 MG/ML injection 15 mg (has no administration in time range)  HYDROmorphone  (DILAUDID ) injection 2 mg (has no administration in time range)  HYDROmorphone  (DILAUDID ) injection 2 mg (has no administration in time range)  HYDROmorphone  (DILAUDID ) injection 2 mg (has no administration in time range)  diphenhydrAMINE  (BENADRYL ) 12.5 mg in sodium chloride  0.9 % 50 mL IVPB (has no administration in time range)  ondansetron  (ZOFRAN ) injection 4 mg (has no administration in time range)    ED Course/ Medical Decision Making/ A&P                                 Medical Decision Making Amount and/or Complexity of Data  Reviewed Labs: ordered.  Risk Prescription drug management.   Labs, IV fluids, opiates ordered and staff are having difficult time with IV access.  IV therapy team consulted.  Will sign off to next provider         Final Clinical Impression(s) / ED Diagnoses Final diagnoses:  None    Rx / DC Orders ED Discharge Orders     None  Lind Repine, MD 06/02/23 [phone]

## 2023-06-02 NOTE — Discharge Instructions (Signed)
 You were seen in the emergency department for your sickle cell pain crisis.  You had mildly worsening anemia and otherwise your lab work was normal.  We were able to control your pain in the emergency department.  You should continue to take your home pain medication as prescribed and can follow-up with your outpatient sickle cell doctor for further pain management.  You should return to the emergency department for your pains being coming uncontrolled, you have severe chest pain or shortness of breath or if you have any other new or concerning symptoms.

## 2023-06-02 NOTE — ED Triage Notes (Signed)
 Presents with [facility] since yesterday. Back and leg pain.

## 2023-06-02 NOTE — ED Provider Notes (Signed)
 Patient signed out to me at 1500 by Dr. Leighton Punches pending labs and reassessment after pain control.  In short this is a 33 year old male with a past medical history of sickle cell anemia that presented to the emergency department with sickle cell pain crisis.  Patient was hemodynamically stable on arrival no acute distress.  He has had labs and pain control ordered, workup is pending at this time.  Of note he was recently seen in the sickle cell clinic earlier this week with similar symptoms.  Clinical Course as of 06/02/23 1740  Fri Jun 02, 2023  1652 Patient has received 1 dose of dilaudid  and Toradol , no improvement of pain. Requesting to eat. Mildly worsening anemia, otherwise labs appropriate. Will be closely reassessed. [VK]  [id number] Patient reports improvement of pain, feeling like he can go home. Requesting one additional dose of pain meds. Will be given home percocet. He is otherwise stable for discharge home with outpatient follow up. [VK]    Clinical Course User Index [VK] Kingsley, Jamayah Myszka K, DO      Timothy Marks, [place] 06/02/23 1740

## 2023-06-05 ENCOUNTER — Other Ambulatory Visit (HOSPITAL_COMMUNITY): Payer: Self-pay

## 2023-06-12 ENCOUNTER — Other Ambulatory Visit: Payer: Self-pay | Admitting: Nurse Practitioner

## 2023-06-12 ENCOUNTER — Other Ambulatory Visit (HOSPITAL_COMMUNITY): Payer: Self-pay

## 2023-06-12 DIAGNOSIS — D571 Sickle-cell disease without crisis: Secondary | ICD-10-CM

## 2023-06-12 DIAGNOSIS — G894 Chronic pain syndrome: Secondary | ICD-10-CM

## 2023-06-14 ENCOUNTER — Other Ambulatory Visit (HOSPITAL_COMMUNITY): Payer: Self-pay

## 2023-06-17 ENCOUNTER — Other Ambulatory Visit: Payer: Self-pay | Admitting: Nurse Practitioner

## 2023-06-17 ENCOUNTER — Other Ambulatory Visit (HOSPITAL_COMMUNITY): Payer: Self-pay

## 2023-06-17 DIAGNOSIS — G894 Chronic pain syndrome: Secondary | ICD-10-CM

## 2023-06-17 DIAGNOSIS — D571 Sickle-cell disease without crisis: Secondary | ICD-10-CM

## 2023-06-18 ENCOUNTER — Other Ambulatory Visit: Payer: Self-pay | Admitting: Internal Medicine

## 2023-06-19 ENCOUNTER — Other Ambulatory Visit: Payer: Self-pay

## 2023-06-19 ENCOUNTER — Other Ambulatory Visit: Payer: Self-pay | Admitting: Nurse Practitioner

## 2023-06-19 ENCOUNTER — Other Ambulatory Visit (HOSPITAL_COMMUNITY): Payer: Self-pay

## 2023-06-19 ENCOUNTER — Ambulatory Visit (INDEPENDENT_AMBULATORY_CARE_PROVIDER_SITE_OTHER): Payer: Self-pay | Admitting: Nurse Practitioner

## 2023-06-19 ENCOUNTER — Encounter: Payer: Self-pay | Admitting: Nurse Practitioner

## 2023-06-19 VITALS — BP 111/76 | HR 77 | Temp 97.0°F | Wt 115.0 lb

## 2023-06-19 DIAGNOSIS — D571 Sickle-cell disease without crisis: Secondary | ICD-10-CM

## 2023-06-19 DIAGNOSIS — F1290 Cannabis use, unspecified, uncomplicated: Secondary | ICD-10-CM

## 2023-06-19 DIAGNOSIS — G894 Chronic pain syndrome: Secondary | ICD-10-CM

## 2023-06-19 DIAGNOSIS — E559 Vitamin D deficiency, unspecified: Secondary | ICD-10-CM | POA: Diagnosis not present

## 2023-06-19 DIAGNOSIS — G4700 Insomnia, unspecified: Secondary | ICD-10-CM

## 2023-06-19 DIAGNOSIS — Z86718 Personal history of other venous thrombosis and embolism: Secondary | ICD-10-CM | POA: Diagnosis not present

## 2023-06-19 MED ORDER — OXYCODONE-ACETAMINOPHEN 10-325 MG PO TABS
1.0000 | ORAL_TABLET | ORAL | 0 refills | Status: DC | PRN
  Filled 2023-06-20: qty 90, 15d supply, fill #0

## 2023-06-19 MED ORDER — APIXABAN 5 MG PO TABS
5.0000 mg | ORAL_TABLET | Freq: Two times a day (BID) | ORAL | 6 refills | Status: DC
Start: 2023-06-19 — End: 2023-08-30
  Filled 2023-06-19: qty 60, 30d supply, fill #0
  Filled 2023-07-31: qty 60, 30d supply, fill #1

## 2023-06-19 MED ORDER — HYDROXYUREA 500 MG PO CAPS
500.0000 mg | ORAL_CAPSULE | Freq: Two times a day (BID) | ORAL | 2 refills | Status: AC
  Filled 2023-06-29: qty 180, 90d supply, fill #0
  Filled 2023-09-28: qty 180, 90d supply, fill #1
  Filled 2024-02-21: qty 54, 27d supply, fill #2
  Filled 2024-02-21: qty 126, 63d supply, fill #2

## 2023-06-19 NOTE — Assessment & Plan Note (Signed)
 Continue Eliquis 5 mg twice daily

## 2023-06-19 NOTE — Assessment & Plan Note (Addendum)
 Aaron Aas

## 2023-06-19 NOTE — Patient Instructions (Signed)
 [address] [place] [address]  P:  [phone] F:  [phone]  It is important that you exercise regularly at least 30 minutes 5 times a week as tolerated  Think about what you will eat, plan ahead. Choose " clean, green, fresh or frozen" over canned, processed or packaged foods which are more sugary, salty and fatty. 70 to 75% of food eaten should be vegetables and fruit. Three meals at set times with snacks allowed between meals, but they must be fruit or vegetables. Aim to eat over a 12 hour period , example 7 am to 7 pm, and STOP after  your last meal of the day. Drink water,generally about 64 ounces per day, no other drink is as healthy. Fruit juice is best enjoyed in a healthy way, by EATING the fruit.  Thanks for choosing [facility] we consider it a privelige to serve you.

## 2023-06-19 NOTE — Assessment & Plan Note (Signed)
 Continue ibuprofen  800 mg 3 times daily as needed, Percocet 11/17/2023 1 tablet every 4 hours as needed

## 2023-06-19 NOTE — Assessment & Plan Note (Addendum)
 ickle cell disease - Continue Hydrea  500 mg BID. Folic acid  1 mg daily to prevent aplastic bone marrow crises.  We discussed the need for good hydration, monitoring of hydration status, avoidance of heat, cold, stress, and infection triggers. The patient was reminded of the need to seek medical attention of any symptoms of bleeding, anemia, or infection.      Eye - High risk of proliferative retinopathy.  States that he has upcoming appointment with the ophthalmologist at [facility], need for yearly eye exam discussed.   Acute and chronic painful episodes -  .  We discussed that pt is to receive her Schedule II prescriptions only from us . Pt is also aware that the prescription history is available to us  online through the [facility] CSRS. We reminded the patient that all patients receiving Schedule II narcotics must be seen for follow within the 3 months in the office.  We reviewed the terms of our pain agreement, including the need to keep medicines in a safe locked location away from children or pets, and the need to report excess sedation or constipation, measures to avoid constipation. According to the [place] [facility] program, we have reviewed details related to analgesia, adverse effects, aberrant behaviors. Reviewed [place] Substance Reporting system prior to prescribing opiate medication, no inconsistencies noted.    Continue Percocet 10 325 mg 1 tablet every 4 hours as needed for moderate pain, Ibuprofen  800 mg every 8 hours as needed for mild pain  Phone number for hematologist at [facility] health provided, he was encouraged to call them if he is still interested in seeing her hematologist  Follow-up in 3 months    - Sickle Cell Panel - ToxAssure Flex 15, Ur

## 2023-06-19 NOTE — Progress Notes (Signed)
 Established Patient Office Visit  Subjective:  Patient ID: Timothy Marks, male    DOB: 04-02-1990  Age: 33 y.o. MRN: [id number]  CC:  Chief Complaint  Patient presents with   Sickle Cell Anemia    HPI ETAI LISENBY is a 33 y.o. 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  Patient presents for follow-up for his chronic medical conditions  Sickle cell disease.  Currently on folic acid  1 mg daily, hydroxyurea  500 mg twice daily.  For chronic pain takes ibuprofen  800 mg 3 times daily for mild pain and Percocet 10-325 mg 1 tablet every 4 hours as needed for moderate pain.  His pain level is currently a 0/10.  When he does have pain it is usually in his lower back.  Percocet was last taken yesterday.  States that he has blood coming yearly eye exam at [facility].  Currently denies fever, chills, chest pain, shortness of breath, cough, wheezing, abdominal pain, nausea, vomiting.  He is requesting for a handicap parking placard.  He was referred to hematologist at [facility] but states that he has not heard from them.    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   Sickle cell anemia (HCC)    Vitamin D  deficiency 2015    Past Surgical History:  Procedure Laterality Date   CHOLECYSTECTOMY      Family History  Problem Relation Age of Onset   Sickle cell trait Mother    Sickle cell trait Father     Social History   Socioeconomic History   Marital status: Single    Spouse name: Not on file   Number of children: Not on file   Years of education: Not on file    Highest education level: 12th grade  Occupational History   Not on file  Tobacco Use   Smoking status: Former    Types: Cigarettes    Passive exposure: Past   Smokeless tobacco: Never  Vaping Use   Vaping status: Never Used  Substance and Sexual Activity   Alcohol  use: No   Drug use: Yes    Frequency: 7.0 times per week    Types: Marijuana   Sexual activity: Yes  Other Topics Concern   Not on file  Social History Narrative   Not on file   Social Drivers of Health   Financial Resource Strain: Low Risk  (05/04/2023)   Overall Financial Resource Strain (CARDIA)    Difficulty of Paying Living Expenses: Not hard at all  Food Insecurity: No Food Insecurity (05/04/2023)   Hunger Vital Sign    Worried About Running Out of Food in the Last Year: Never true    Ran Out of Food in the Last Year: Never true  Transportation Needs: No Transportation Needs (05/04/2023)   PRAPARE - Administrator, Civil Service (Medical): No    Lack of Transportation (Non-Medical): No  Physical Activity: Sufficiently Active (05/04/2023)   Exercise Vital Sign    Days of Exercise per Week: 3 days    Minutes of Exercise per Session: 60 min  Stress: Stress Concern Present (05/04/2023)  [employer] of Occupational Health - Occupational Stress Questionnaire    Feeling of Stress : Very much  Social Connections: Unknown (05/04/2023)   Social Connection and Isolation Panel [NHANES]    Frequency of Communication with Friends and Family: Once a week    Frequency of Social Gatherings with Friends and Family: Once a week    Attends Religious Services: Patient declined    Database administrator or Organizations: No    Attends Banker Meetings: Never    Marital Status: Patient declined  Catering manager Violence: Not At Risk (05/04/2023)   Humiliation, Afraid, Rape, and Kick questionnaire    Fear of Current or Ex-Partner: No    Emotionally Abused: No    Physically Abused: No    Sexually  Abused: No    Outpatient Medications Prior to Visit  Medication Sig Dispense Refill   diphenhydrAMINE  (BENADRYL ) 25 mg capsule Take 1 capsule (25 mg total) by mouth every 6 (six) hours as needed for itching. 30 capsule 1   ergocalciferol  (VITAMIN D2) 1.25 MG (50000 UT) capsule Take 1 capsule (50,000 Units total) by mouth once a week. 14 capsule 1   folic acid  (FOLVITE ) 1 MG tablet Take 1 tablet (1 mg total) by mouth daily. 90 tablet 2   hydrocortisone  1 % ointment Apply 1 Application topically 2 (two) times daily. 30 g 0   hydrOXYzine  (ATARAX ) 25 MG tablet Take 1 tablet (25 mg total) by mouth daily as needed for anxiety or vomiting. 30 tablet 1   ibuprofen  (ADVIL ) 800 MG tablet Take 1 tablet (800 mg total) by mouth every 8 (eight) hours as needed. 30 tablet 1   [START ON 06/20/2023] oxyCODONE -acetaminophen  (PERCOCET) 10-325 MG tablet Take 1 tablet by mouth every 4 (four) hours as needed for up to 15 days for pain. 90 tablet 0   apixaban  (ELIQUIS ) 5 MG TABS tablet Take 1 tablet (5 mg total) by mouth 2 (two) times daily. 60 tablet 1   hydroxyurea  (HYDREA ) 500 MG capsule Take 1 capsule (500 mg total) by mouth 2 (two) times daily. May take with food to minimize GI side effects. 180 capsule 2   gabapentin  (NEURONTIN ) 300 MG capsule Take 1 capsule (300 mg total) by mouth 3 (three) times daily. (Patient not taking: Reported on 05/11/2023) 90 capsule 5   mirtazapine  (REMERON ) 7.5 MG tablet Take 1 tablet (7.5 mg total) by mouth at bedtime as needed (for sleep). (Patient not taking: Reported on 06/19/2023) 30 tablet 1   naloxone  (NARCAN ) nasal spray 4 mg/0.1 mL Place 1 spray into the nose once. (Patient not taking: Reported on 06/19/2023)     cholecalciferol  (VITAMIN D3) 25 MCG (1000 UNIT) tablet Take 1 tablet (1,000 Units total) by mouth daily. (Patient not taking: Reported on 06/19/2023) 90 tablet 1   No facility-administered medications prior to visit.    Allergies  Allergen Reactions   Buprenorphine Hcl  Hives   Levaquin  [Levofloxacin ] Itching   Meperidine Rash   Morphine  Hives    Takes MS IR at home   Morphine  And Codeine Hives    Takes MSIR PTA   Tramadol Hives   Vancomycin Itching   Zosyn [Piperacillin Sod-Tazobactam So] Itching, Rash and Other (See Comments)    Has taken rocephin  in the past    ROS Review of Systems  Constitutional:  Negative for appetite change, chills, fatigue and fever.  HENT:  Negative for congestion, postnasal drip, rhinorrhea and sneezing.   Respiratory:  Negative for cough, shortness  of breath and wheezing.   Cardiovascular:  Negative for chest pain, palpitations and leg swelling.  Gastrointestinal:  Negative for abdominal pain, constipation, nausea and vomiting.  Genitourinary:  Negative for difficulty urinating, dysuria, flank pain and frequency.  Musculoskeletal:  Negative for arthralgias, back pain, joint swelling and myalgias.  Skin:  Negative for color change, pallor, rash and wound.  Neurological:  Negative for dizziness, facial asymmetry, weakness, numbness and headaches.  Psychiatric/Behavioral:  Negative for behavioral problems, confusion, self-injury and suicidal ideas.       Objective:    Physical Exam Vitals and nursing note reviewed.  Constitutional:      General: He is not in acute distress.    Appearance: Normal appearance. He is not ill-appearing, toxic-appearing or diaphoretic.  Eyes:     General: Scleral icterus present.        Right eye: No discharge.        Left eye: No discharge.     Extraocular Movements: Extraocular movements intact.     Conjunctiva/sclera: Conjunctivae normal.  Cardiovascular:     Rate and Rhythm: Normal rate and regular rhythm.     Pulses: Normal pulses.     Heart sounds: Normal heart sounds. No murmur heard.    No friction rub. No gallop.  Pulmonary:     Effort: Pulmonary effort is normal. No respiratory distress.     Breath sounds: Normal breath sounds. No stridor. No wheezing, rhonchi or rales.   Chest:     Chest wall: No tenderness.  Abdominal:     General: There is no distension.     Palpations: Abdomen is soft.     Tenderness: There is no abdominal tenderness. There is no right CVA tenderness, left CVA tenderness or guarding.  Musculoskeletal:        General: No swelling, tenderness, deformity or signs of injury.     Right lower leg: No edema.     Left lower leg: No edema.  Skin:    General: Skin is warm and dry.     Capillary Refill: Capillary refill takes 2 to 3 seconds.     Coloration: Skin is not jaundiced or pale.     Findings: No bruising, erythema or lesion.  Neurological:     Mental Status: He is alert and oriented to person, place, and time.     Motor: No weakness.     Coordination: Coordination normal.     Gait: Gait normal.  Psychiatric:        Mood and Affect: Mood normal.        Behavior: Behavior normal.        Thought Content: Thought content normal.        Judgment: Judgment normal.     BP 111/76   Pulse 77   Temp (!) 97 F (36.1 C)   Wt 115 lb (52.2 kg)   SpO2 96%   BMI 14.77 kg/m  Wt Readings from Last 3 Encounters:  06/19/23 115 lb (52.2 kg)  06/02/23 125 lb (56.7 kg)  05/11/23 120 lb (54.4 kg)    Lab Results  Component Value Date   TSH 0.884 08/31/2018   Lab Results  Component Value Date   WBC 8.3 06/02/2023   HGB 8.2 (L) 06/02/2023   HCT 23.0 (L) 06/02/2023   MCV 91.6 06/02/2023   PLT 357 06/02/2023   Lab Results  Component Value Date   NA 139 06/02/2023   K 3.9 06/02/2023   CO2 22 06/02/2023   GLUCOSE  96 06/02/2023   BUN 8 06/02/2023   CREATININE 0.32 (L) 06/02/2023   BILITOT 4.2 (H) 06/02/2023   ALKPHOS 49 06/02/2023   AST 32 06/02/2023   ALT 11 06/02/2023   PROT 7.1 06/02/2023   ALBUMIN 4.0 06/02/2023   CALCIUM 8.8 (L) 06/02/2023   ANIONGAP 9 06/02/2023   EGFR 129 03/21/2023   No results found for: "CHOL" No results found for: "HDL" No results found for: "LDLCALC" No results found for: "TRIG" No results  found for: "CHOLHDL" No results found for: "HGBA1C"    Assessment & Plan:   Problem List Items Addressed This Visit       Other   Sickle cell anemia (HCC) - Primary (Chronic)   ickle cell disease - Continue Hydrea  500 mg BID. Folic acid  1 mg daily to prevent aplastic bone marrow crises.  We discussed the need for good hydration, monitoring of hydration status, avoidance of heat, cold, stress, and infection triggers. The patient was reminded of the need to seek medical attention of any symptoms of bleeding, anemia, or infection.      Eye - High risk of proliferative retinopathy.  States that he has upcoming appointment with the ophthalmologist at [facility], need for yearly eye exam discussed.   Acute and chronic painful episodes -  .  We discussed that pt is to receive her Schedule II prescriptions only from us . Pt is also aware that the prescription history is available to us  online through the [facility] CSRS. We reminded the patient that all patients receiving Schedule II narcotics must be seen for follow within the 3 months in the office.  We reviewed the terms of our pain agreement, including the need to keep medicines in a safe locked location away from children or pets, and the need to report excess sedation or constipation, measures to avoid constipation. According to the [place] [facility] program, we have reviewed details related to analgesia, adverse effects, aberrant behaviors. Reviewed [place] Substance Reporting system prior to prescribing opiate medication, no inconsistencies noted.    Continue Percocet 10 325 mg 1 tablet every 4 hours as needed for moderate pain, Ibuprofen  800 mg every 8 hours as needed for mild pain  Phone number for hematologist at [facility] health provided, he was encouraged to call them if he is still interested in seeing her hematologist  Follow-up in 3 months    - Sickle Cell Panel - ToxAssure Flex 15, Ur       Relevant Medications   hydroxyurea  (HYDREA )  500 MG capsule   Other Relevant Orders   Sickle Cell Panel   ToxAssure Flex 15, Ur   Vitamin D  deficiency (Chronic)   Last vitamin D  Lab Results  Component Value Date   VD25OH 24.7 (L) 03/21/2023  Continue vitamin D  50,000 units once weekly      Chronic pain syndrome   Continue ibuprofen  800 mg 3 times daily as needed, Percocet 11/17/2023 1 tablet every 4 hours as needed      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        Meds ordered this encounter  Medications   hydroxyurea  (HYDREA ) 500 MG capsule    Sig: Take 1 capsule (500 mg total) by mouth 2 (two) times daily. May take with food to minimize GI side effects.    Dispense:  180 capsule    Refill:  2   apixaban  (ELIQUIS ) 5 MG TABS tablet    Sig: Take 1 tablet (5 mg total) by mouth 2 (two) times daily.    Dispense:  60 tablet    Refill:  6    Follow-up: Return in about 3 months (around 09/19/2023) for CPE.    Russel Morain R Herman Fiero, FNP

## 2023-06-19 NOTE — Assessment & Plan Note (Signed)
 Need to avoid use of illicit drugs discussed

## 2023-06-19 NOTE — Assessment & Plan Note (Signed)
 Last vitamin D  Lab Results  Component Value Date   VD25OH 24.7 (L) 03/21/2023  Continue vitamin D  50,000 units once weekly

## 2023-06-19 NOTE — Assessment & Plan Note (Signed)
 Takes  mirtazapine  7.5 mg at bedtime as needed.

## 2023-06-19 NOTE — Progress Notes (Signed)
 Reviewed PDMP substance reporting system prior to prescribing opiate medications. No inconsistencies noted.   1. Sickle cell disease without crisis (HCC)  - oxyCODONE-acetaminophen (PERCOCET) 10-325 MG tablet; Take 1 tablet by mouth every 4 (four) hours as needed for up to 15 days for pain.  Dispense: 90 tablet; Refill: 0  2. Chronic pain syndrome  - oxyCODONE-acetaminophen (PERCOCET) 10-325 MG tablet; Take 1 tablet by mouth every 4 (four) hours as needed for up to 15 days for pain.  Dispense: 90 tablet; Refill: 0

## 2023-06-19 NOTE — Assessment & Plan Note (Signed)
 Lab Results  Component Value Date   FERRITIN 1,744 (H) 03/21/2023  Rechecking labs today

## 2023-06-20 ENCOUNTER — Other Ambulatory Visit: Payer: Self-pay

## 2023-06-20 ENCOUNTER — Other Ambulatory Visit (HOSPITAL_COMMUNITY): Payer: Self-pay

## 2023-06-20 LAB — CMP14+CBC/D/PLT+FER+RETIC+V...
ALT: 9 IU/L (ref 0–44)
AST: 35 IU/L (ref 0–40)
Albumin: 4.6 g/dL (ref 4.1–5.1)
Alkaline Phosphatase: 66 IU/L (ref 44–121)
BUN/Creatinine Ratio: 8 — ABNORMAL LOW (ref 9–20)
BUN: 5 mg/dL — ABNORMAL LOW (ref 6–20)
Basophils Absolute: 0 10*3/uL (ref 0.0–0.2)
Basos: 1 %
Bilirubin Total: 2.6 mg/dL — ABNORMAL HIGH (ref 0.0–1.2)
CO2: 22 mmol/L (ref 20–29)
Calcium: 9.4 mg/dL (ref 8.7–10.2)
Chloride: 103 mmol/L (ref 96–106)
Creatinine, Ser: 0.65 mg/dL — ABNORMAL LOW (ref 0.76–1.27)
EOS (ABSOLUTE): 0.2 10*3/uL (ref 0.0–0.4)
Eos: 4 %
Ferritin: 2038 ng/mL — ABNORMAL HIGH (ref 30–400)
Globulin, Total: 2.5 g/dL (ref 1.5–4.5)
Glucose: 77 mg/dL (ref 70–99)
Hematocrit: 33.5 % — ABNORMAL LOW (ref 37.5–51.0)
Hemoglobin: 11.2 g/dL — ABNORMAL LOW (ref 13.0–17.7)
Immature Grans (Abs): 0 10*3/uL (ref 0.0–0.1)
Immature Granulocytes: 0 %
Lymphocytes Absolute: 1.8 10*3/uL (ref 0.7–3.1)
Lymphs: 34 %
MCH: 33.7 pg — ABNORMAL HIGH (ref 26.6–33.0)
MCHC: 33.4 g/dL (ref 31.5–35.7)
MCV: 101 fL — ABNORMAL HIGH (ref 79–97)
Monocytes Absolute: 0.6 10*3/uL (ref 0.1–0.9)
Monocytes: 10 %
NRBC: 4 % — ABNORMAL HIGH (ref 0–0)
Neutrophils Absolute: 2.7 10*3/uL (ref 1.4–7.0)
Neutrophils: 51 %
Platelets: 316 10*3/uL (ref 150–450)
Potassium: 4.5 mmol/L (ref 3.5–5.2)
RBC: 3.32 x10E6/uL — ABNORMAL LOW (ref 4.14–5.80)
RDW: 16.2 % — ABNORMAL HIGH (ref 11.6–15.4)
Retic Ct Pct: 8.4 % — ABNORMAL HIGH (ref 0.6–2.6)
Sodium: 138 mmol/L (ref 134–144)
Total Protein: 7.1 g/dL (ref 6.0–8.5)
Vit D, 25-Hydroxy: 51 ng/mL (ref 30.0–100.0)
WBC: 5.3 10*3/uL (ref 3.4–10.8)
eGFR: 127 mL/min/{1.73_m2} (ref >59)

## 2023-06-26 LAB — TOXASSURE FLEX 15, UR
6-ACETYLMORPHINE IA: NEGATIVE ng/mL
AMPHETAMINES IA: NEGATIVE ng/mL
BARBITURATES IA: NEGATIVE ng/mL
COCAINE METABOLITE IA: NEGATIVE ng/mL
Creatinine: 109 mg/dL (ref >=20)
ETHYL ALCOHOL Enzymatic: NEGATIVE g/dL
METHADONE IA: NEGATIVE ng/mL
METHADONE MTB IA: NEGATIVE ng/mL
OPIATE CLASS IA: NEGATIVE ng/mL
PHENCYCLIDINE IA: NEGATIVE ng/mL
TAPENTADOL, IA: NEGATIVE ng/mL
TRAMADOL IA: NEGATIVE ng/mL

## 2023-06-26 LAB — CANNABINOIDS, MS, UR RFX
Cannabinoids Confirmation: POSITIVE
Carboxy-THC: 9 ng/mg{creat}

## 2023-06-26 LAB — OXYCODONE CLASS, MS, UR RFX
Noroxycodone: 513 ng/mg{creat}
Noroxymorphone: 214 ng/mg{creat}
Oxycodone Class Confirmation: POSITIVE
Oxycodone: 65 ng/mg{creat}
Oxymorphone: 605 ng/mg{creat}

## 2023-06-27 ENCOUNTER — Other Ambulatory Visit (HOSPITAL_COMMUNITY): Payer: Self-pay

## 2023-06-29 ENCOUNTER — Other Ambulatory Visit (HOSPITAL_COMMUNITY): Payer: Self-pay

## 2023-06-29 ENCOUNTER — Other Ambulatory Visit: Payer: Self-pay | Admitting: Nurse Practitioner

## 2023-06-29 ENCOUNTER — Other Ambulatory Visit: Payer: Self-pay

## 2023-06-29 DIAGNOSIS — D571 Sickle-cell disease without crisis: Secondary | ICD-10-CM

## 2023-06-29 DIAGNOSIS — G894 Chronic pain syndrome: Secondary | ICD-10-CM

## 2023-06-30 ENCOUNTER — Other Ambulatory Visit: Payer: Self-pay | Admitting: Nurse Practitioner

## 2023-06-30 DIAGNOSIS — D571 Sickle-cell disease without crisis: Secondary | ICD-10-CM

## 2023-06-30 DIAGNOSIS — G894 Chronic pain syndrome: Secondary | ICD-10-CM

## 2023-07-03 ENCOUNTER — Other Ambulatory Visit: Payer: Self-pay | Admitting: Nurse Practitioner

## 2023-07-03 ENCOUNTER — Other Ambulatory Visit (HOSPITAL_COMMUNITY): Payer: Self-pay

## 2023-07-03 DIAGNOSIS — G894 Chronic pain syndrome: Secondary | ICD-10-CM

## 2023-07-03 DIAGNOSIS — D571 Sickle-cell disease without crisis: Secondary | ICD-10-CM

## 2023-07-03 MED ORDER — OXYCODONE-ACETAMINOPHEN 10-325 MG PO TABS
1.0000 | ORAL_TABLET | ORAL | 0 refills | Status: DC | PRN
  Filled 2023-07-05: qty 90, 15d supply, fill #0

## 2023-07-03 NOTE — Progress Notes (Signed)
 Reviewed PDMP substance reporting system prior to prescribing opiate medications. No inconsistencies noted.   1. Sickle cell disease without crisis (HCC)  - oxyCODONE-acetaminophen (PERCOCET) 10-325 MG tablet; Take 1 tablet by mouth every 4 (four) hours as needed for up to 15 days for pain.  Dispense: 90 tablet; Refill: 0  2. Chronic pain syndrome  - oxyCODONE-acetaminophen (PERCOCET) 10-325 MG tablet; Take 1 tablet by mouth every 4 (four) hours as needed for up to 15 days for pain.  Dispense: 90 tablet; Refill: 0

## 2023-07-05 ENCOUNTER — Other Ambulatory Visit (HOSPITAL_COMMUNITY): Payer: Self-pay

## 2023-07-17 ENCOUNTER — Other Ambulatory Visit: Payer: Self-pay | Admitting: Nurse Practitioner

## 2023-07-17 ENCOUNTER — Other Ambulatory Visit (HOSPITAL_COMMUNITY): Payer: Self-pay

## 2023-07-17 DIAGNOSIS — D571 Sickle-cell disease without crisis: Secondary | ICD-10-CM

## 2023-07-17 DIAGNOSIS — G894 Chronic pain syndrome: Secondary | ICD-10-CM

## 2023-07-17 MED ORDER — OXYCODONE-ACETAMINOPHEN 10-325 MG PO TABS
1.0000 | ORAL_TABLET | ORAL | 0 refills | Status: AC | PRN
Start: 2023-07-20 — End: 2023-08-04
  Filled 2023-07-20: qty 90, 15d supply, fill #0

## 2023-07-17 NOTE — Progress Notes (Signed)
 Reviewed PDMP substance reporting system prior to prescribing opiate medications. No inconsistencies noted.   1. Sickle cell disease without crisis (HCC)  - oxyCODONE-acetaminophen (PERCOCET) 10-325 MG tablet; Take 1 tablet by mouth every 4 (four) hours as needed for up to 15 days for pain.  Dispense: 90 tablet; Refill: 0  2. Chronic pain syndrome  - oxyCODONE-acetaminophen (PERCOCET) 10-325 MG tablet; Take 1 tablet by mouth every 4 (four) hours as needed for up to 15 days for pain.  Dispense: 90 tablet; Refill: 0

## 2023-07-20 ENCOUNTER — Other Ambulatory Visit (HOSPITAL_COMMUNITY): Payer: Self-pay

## 2023-07-24 DIAGNOSIS — D5700 Hb-SS disease with crisis, unspecified: Secondary | ICD-10-CM | POA: Diagnosis not present

## 2023-07-24 DIAGNOSIS — Z7901 Long term (current) use of anticoagulants: Secondary | ICD-10-CM | POA: Diagnosis not present

## 2023-07-24 DIAGNOSIS — Z79899 Other long term (current) drug therapy: Secondary | ICD-10-CM | POA: Diagnosis not present

## 2023-07-24 DIAGNOSIS — M545 Low back pain, unspecified: Secondary | ICD-10-CM | POA: Diagnosis not present

## 2023-07-28 ENCOUNTER — Other Ambulatory Visit (HOSPITAL_COMMUNITY): Payer: Self-pay

## 2023-07-28 DIAGNOSIS — M545 Low back pain, unspecified: Secondary | ICD-10-CM | POA: Diagnosis not present

## 2023-07-28 DIAGNOSIS — F1120 Opioid dependence, uncomplicated: Secondary | ICD-10-CM | POA: Diagnosis not present

## 2023-07-28 DIAGNOSIS — F419 Anxiety disorder, unspecified: Secondary | ICD-10-CM | POA: Diagnosis not present

## 2023-07-28 DIAGNOSIS — F32A Depression, unspecified: Secondary | ICD-10-CM | POA: Diagnosis not present

## 2023-07-28 DIAGNOSIS — D5709 Hb-ss disease with crisis with other specified complication: Secondary | ICD-10-CM | POA: Diagnosis not present

## 2023-07-28 DIAGNOSIS — G894 Chronic pain syndrome: Secondary | ICD-10-CM | POA: Diagnosis not present

## 2023-07-28 DIAGNOSIS — D5700 Hb-SS disease with crisis, unspecified: Secondary | ICD-10-CM | POA: Diagnosis not present

## 2023-07-28 DIAGNOSIS — D638 Anemia in other chronic diseases classified elsewhere: Secondary | ICD-10-CM | POA: Diagnosis not present

## 2023-07-28 DIAGNOSIS — D72829 Elevated white blood cell count, unspecified: Secondary | ICD-10-CM | POA: Diagnosis not present

## 2023-07-28 DIAGNOSIS — J45909 Unspecified asthma, uncomplicated: Secondary | ICD-10-CM | POA: Diagnosis not present

## 2023-07-29 DIAGNOSIS — D5700 Hb-SS disease with crisis, unspecified: Secondary | ICD-10-CM | POA: Diagnosis not present

## 2023-07-31 ENCOUNTER — Other Ambulatory Visit: Payer: Self-pay | Admitting: Nurse Practitioner

## 2023-07-31 DIAGNOSIS — D571 Sickle-cell disease without crisis: Secondary | ICD-10-CM

## 2023-07-31 DIAGNOSIS — G894 Chronic pain syndrome: Secondary | ICD-10-CM

## 2023-08-01 ENCOUNTER — Other Ambulatory Visit: Payer: Self-pay

## 2023-08-01 ENCOUNTER — Telehealth: Payer: Self-pay

## 2023-08-01 NOTE — Transitions of Care (Post Inpatient/ED Visit) (Signed)
   08/01/2023  Name: ANGEL WEEDON MRN: [id number] DOB: 02/26/1990  Today's TOC FU Call Status: Today's TOC FU Call Status:: Unsuccessful Call (1st Attempt) Unsuccessful Call (1st Attempt) Date: 08/01/23  Attempted to reach the patient regarding the most recent Inpatient/ED visit.  Follow Up Plan: Additional outreach attempts will be made to reach the patient to complete the Transitions of Care (Post Inpatient/ED visit) call.   Tonia Frankel RN, CCM [place]  VBCI-Population Health RN Care Manager [phone]

## 2023-08-02 ENCOUNTER — Telehealth: Payer: Self-pay

## 2023-08-02 ENCOUNTER — Other Ambulatory Visit: Payer: Self-pay | Admitting: Nurse Practitioner

## 2023-08-02 ENCOUNTER — Other Ambulatory Visit (HOSPITAL_COMMUNITY): Payer: Self-pay

## 2023-08-02 DIAGNOSIS — G894 Chronic pain syndrome: Secondary | ICD-10-CM

## 2023-08-02 DIAGNOSIS — D571 Sickle-cell disease without crisis: Secondary | ICD-10-CM

## 2023-08-02 MED ORDER — OXYCODONE-ACETAMINOPHEN 10-325 MG PO TABS
1.0000 | ORAL_TABLET | ORAL | 0 refills | Status: DC | PRN
  Filled 2023-08-04: qty 90, 15d supply, fill #0

## 2023-08-02 NOTE — Progress Notes (Signed)
 Reviewed PDMP substance reporting system prior to prescribing opiate medications. No inconsistencies noted.   1. Sickle cell disease without crisis (HCC)  - oxyCODONE-acetaminophen (PERCOCET) 10-325 MG tablet; Take 1 tablet by mouth every 4 (four) hours as needed for up to 15 days for pain.  Dispense: 90 tablet; Refill: 0  2. Chronic pain syndrome  - oxyCODONE-acetaminophen (PERCOCET) 10-325 MG tablet; Take 1 tablet by mouth every 4 (four) hours as needed for up to 15 days for pain.  Dispense: 90 tablet; Refill: 0

## 2023-08-02 NOTE — Telephone Encounter (Unsigned)
 Copied from CRM [id number]. Topic: Clinical - Medication Refill >> Aug 02, 2023  2:05 PM Emylou G wrote: Medication: oxyCODONE -acetaminophen  (PERCOCET) 10-325 MG tablet  Has the patient contacted their pharmacy? Yes (Agent: If no, request that the patient contact the pharmacy for the refill. If patient does not wish to contact the pharmacy document the reason why and proceed with request.) (Agent: If yes, when and what did the pharmacy advise?) said to call  This is the patient's preferred pharmacy:  [place] [facility] [address] [place] [address] Phone: [phone] Fax: [phone]  Is this the correct pharmacy for this prescription? Yes If no, delete pharmacy and type the correct one.   Has the prescription been filled recently? No  Is the patient out of the medication? Yes  Has the patient been seen for an appointment in the last year OR does the patient have an upcoming appointment? Yes  Can we respond through MyChart? Yes  Agent: Please be advised that Rx refills may take up to 3 business days. We ask that you follow-up with your pharmacy.

## 2023-08-02 NOTE — Transitions of Care (Post Inpatient/ED Visit) (Signed)
   08/02/2023  Name: KEITH FELTEN MRN: [id number] DOB: 1990-05-10  Today's TOC FU Call Status: Today's TOC FU Call Status:: Unsuccessful Call (2nd Attempt) Unsuccessful Call (2nd Attempt) Date: 08/02/23  Attempted to reach the patient regarding the most recent Inpatient/ED visit.  Follow Up Plan: Additional outreach attempts will be made to reach the patient to complete the Transitions of Care (Post Inpatient/ED visit) call.   Tonia Frankel RN, CCM [place]  VBCI-Population Health RN Care Manager [phone]

## 2023-08-03 ENCOUNTER — Emergency Department (HOSPITAL_BASED_OUTPATIENT_CLINIC_OR_DEPARTMENT_OTHER)
Admission: EM | Admit: 2023-08-03 | Discharge: 2023-08-03 | Disposition: A | Discharge status: Home / Self Care | Attending: Emergency Medicine | Admitting: Emergency Medicine

## 2023-08-03 ENCOUNTER — Encounter (HOSPITAL_BASED_OUTPATIENT_CLINIC_OR_DEPARTMENT_OTHER): Payer: Self-pay

## 2023-08-03 ENCOUNTER — Telehealth: Payer: Self-pay

## 2023-08-03 ENCOUNTER — Other Ambulatory Visit: Payer: Self-pay

## 2023-08-03 DIAGNOSIS — Z7901 Long term (current) use of anticoagulants: Secondary | ICD-10-CM | POA: Insufficient documentation

## 2023-08-03 DIAGNOSIS — D5700 Hb-SS disease with crisis, unspecified: Secondary | ICD-10-CM

## 2023-08-03 LAB — CBC WITH DIFFERENTIAL/PLATELET
Abs Immature Granulocytes: 0 10*3/uL (ref 0.00–0.07)
Basophils Absolute: 0 10*3/uL (ref 0.0–0.1)
Basophils Relative: 0 %
Eosinophils Absolute: 0.2 10*3/uL (ref 0.0–0.5)
Eosinophils Relative: 2 %
HCT: 24.8 % — ABNORMAL LOW (ref 39.0–52.0)
Hemoglobin: 8.8 g/dL — ABNORMAL LOW (ref 13.0–17.0)
Lymphocytes Relative: 25 %
Lymphs Abs: 2.9 10*3/uL (ref 0.7–4.0)
MCH: 33.5 pg (ref 26.0–34.0)
MCHC: 35.5 g/dL (ref 30.0–36.0)
MCV: 94.3 fL (ref 80.0–100.0)
Monocytes Absolute: 0.3 10*3/uL (ref 0.1–1.0)
Monocytes Relative: 3 %
Neutro Abs: 8.1 10*3/uL — ABNORMAL HIGH (ref 1.7–7.7)
Neutrophils Relative %: 70 %
Platelets: 513 10*3/uL — ABNORMAL HIGH (ref 150–400)
RBC Morphology: NONE SEEN
RBC: 2.63 MIL/uL — ABNORMAL LOW (ref 4.22–5.81)
RDW: 17.9 % — ABNORMAL HIGH (ref 11.5–15.5)
WBC: 11.6 10*3/uL — ABNORMAL HIGH (ref 4.0–10.5)
nRBC: 16.7 % — ABNORMAL HIGH (ref 0.0–0.2)

## 2023-08-03 LAB — COMPREHENSIVE METABOLIC PANEL WITH GFR
ALT: 15 U/L (ref 0–44)
AST: 22 U/L (ref 15–41)
Albumin: 4.3 g/dL (ref 3.5–5.0)
Alkaline Phosphatase: 80 U/L (ref 38–126)
Anion gap: 13 (ref 5–15)
BUN: 6 mg/dL (ref 6–20)
CO2: 23 mmol/L (ref 22–32)
Calcium: 9.6 mg/dL (ref 8.9–10.3)
Chloride: 103 mmol/L (ref 98–111)
Creatinine, Ser: 0.63 mg/dL (ref 0.61–1.24)
GFR, Estimated: >60 mL/min (ref >60)
Glucose, Bld: 88 mg/dL (ref 70–99)
Potassium: 4 mmol/L (ref 3.5–5.1)
Sodium: 139 mmol/L (ref 135–145)
Total Bilirubin: 1.2 mg/dL (ref 0.0–1.2)
Total Protein: 7.8 g/dL (ref 6.5–8.1)

## 2023-08-03 LAB — RETICULOCYTES
Immature Retic Fract: 22.2 % — ABNORMAL HIGH (ref 2.3–15.9)
RBC.: 2.61 MIL/uL — ABNORMAL LOW (ref 4.22–5.81)
Retic Count, Absolute: 242.5 10*3/uL — ABNORMAL HIGH (ref 19.0–186.0)
Retic Ct Pct: 9 % — ABNORMAL HIGH (ref 0.4–3.1)

## 2023-08-03 MED ORDER — DEXTROSE-SODIUM CHLORIDE 5-0.45 % IV SOLN: INTRAVENOUS | Status: DC

## 2023-08-03 MED ORDER — HYDROMORPHONE HCL 1 MG/ML IJ SOLN
1.0000 mg | Freq: Once | INTRAMUSCULAR | Status: DC
  Filled 2023-08-03: qty 1

## 2023-08-03 MED ORDER — ONDANSETRON HCL 4 MG/2ML IJ SOLN
4.0000 mg | INTRAMUSCULAR | Status: DC | PRN
  Administered 2023-08-03: 4 mg via INTRAVENOUS
  Filled 2023-08-03: qty 2

## 2023-08-03 MED ORDER — HYDROMORPHONE HCL 1 MG/ML IJ SOLN
2.0000 mg | INTRAMUSCULAR | Status: AC
  Administered 2023-08-03: 2 mg via INTRAVENOUS
  Filled 2023-08-03: qty 2

## 2023-08-03 MED ORDER — KETOROLAC TROMETHAMINE 15 MG/ML IJ SOLN
15.0000 mg | INTRAMUSCULAR | Status: AC
  Administered 2023-08-03: 15 mg via INTRAVENOUS
  Filled 2023-08-03: qty 1

## 2023-08-03 NOTE — ED Provider Notes (Signed)
  EMERGENCY DEPARTMENT AT [facility] Provider Note   CSN: [id number] Arrival date & time: 08/03/23  1823     Patient presents with: Sickle Cell Pain Crisis   Timothy Marks is a 33 y.o. male.   Patient with history of sickle cell anemia, DVT on Eliquis  presents today with complaints of sickle cell pain crisis.  Endorses pain in his back and arms consistent with his normal crises pain.  Denies any chest pain or shortness of breath.  Pain began this morning.  Notes he is out of his home Percocet, scheduled to have a refill tomorrow.  Denies fevers, chills, nausea, vomiting, or abdominal pain.  The history is provided by the patient. No language interpreter was used.  Sickle Cell Pain Crisis      Prior to Admission medications   Medication Sig Start Date End Date Taking? Authorizing Provider  apixaban  (ELIQUIS ) 5 MG TABS tablet Take 1 tablet (5 mg total) by mouth 2 (two) times daily. 06/19/23   Paseda, Folashade R, FNP  diphenhydrAMINE  (BENADRYL ) 25 mg capsule Take 1 capsule (25 mg total) by mouth every 6 (six) hours as needed for itching. 01/17/23   Paseda, Folashade R, FNP  ergocalciferol  (VITAMIN D2) 1.25 MG (50000 UT) capsule Take 1 capsule (50,000 Units total) by mouth once a week. 05/16/23     folic acid  (FOLVITE ) 1 MG tablet Take 1 tablet (1 mg total) by mouth daily. 03/21/23   Paseda, Folashade R, FNP  gabapentin  (NEURONTIN ) 300 MG capsule Take 1 capsule (300 mg total) by mouth 3 (three) times daily. Patient not taking: Reported on 05/11/2023 05/03/23   Paseda, Folashade R, FNP  hydrocortisone  1 % ointment Apply 1 Application topically 2 (two) times daily. 10/14/21   Jegede, Olugbemiga E, MD  hydroxyurea  (HYDREA ) 500 MG capsule Take 1 capsule (500 mg total) by mouth 2 (two) times daily. May take with food to minimize GI side effects. 06/19/23   Paseda, Folashade R, FNP  hydrOXYzine  (ATARAX ) 25 MG tablet Take 1 tablet (25 mg total) by mouth daily as needed for anxiety or  vomiting. 01/17/23   Paseda, Folashade R, FNP  ibuprofen  (ADVIL ) 800 MG tablet Take 1 tablet (800 mg total) by mouth every 8 (eight) hours as needed. 05/03/23   Paseda, Folashade R, FNP  mirtazapine  (REMERON ) 7.5 MG tablet Take 1 tablet (7.5 mg total) by mouth at bedtime as needed (for sleep). Patient not taking: Reported on 06/19/2023 01/17/23   Paseda, Folashade R, FNP  naloxone  (NARCAN ) nasal spray 4 mg/0.1 mL Place 1 spray into the nose once. Patient not taking: Reported on 06/19/2023 09/04/22   [provider]  oxyCODONE -acetaminophen  (PERCOCET) 10-325 MG tablet Take 1 tablet by mouth every 4 (four) hours as needed for up to 15 days for pain. 08/04/23 08/19/23  Paseda, Folashade R, FNP    Allergies: Buprenorphine hcl, Levaquin  [levofloxacin ], Meperidine, Morphine , Morphine  and codeine, Tramadol, Vancomycin, and Zosyn [piperacillin sod-tazobactam so]    Review of Systems  All other systems reviewed and are negative.   Updated Vital Signs BP 119/78 (BP Location: Right Arm)   Pulse (!) 105   Temp 99.2 F (37.3 C) (Temporal)   Resp 18   Ht 6' 2 (1.88 m)   Wt 61.2 kg   SpO2 98%   BMI 17.33 kg/m   Physical Exam Vitals and nursing note reviewed.  Constitutional:      General: He is not in acute distress.    Appearance: Normal appearance. He is  normal weight. He is not ill-appearing, toxic-appearing or diaphoretic.  HENT:     Head: Normocephalic and atraumatic.   Cardiovascular:     Rate and Rhythm: Normal rate.  Pulmonary:     Effort: Pulmonary effort is normal. No respiratory distress.  Abdominal:     General: Abdomen is flat.     Palpations: Abdomen is soft.     Tenderness: There is no abdominal tenderness.   Musculoskeletal:        General: Normal range of motion.     Cervical back: Normal range of motion.   Skin:    General: Skin is warm and dry.   Neurological:     General: No focal deficit present.     Mental Status: He is alert.   Psychiatric:        Mood  and Affect: Mood normal.        Behavior: Behavior normal.     (all labs ordered are listed, but only abnormal results are displayed) Labs Reviewed  COMPREHENSIVE METABOLIC PANEL WITH GFR  CBC WITH DIFFERENTIAL/PLATELET  RETICULOCYTES    EKG: None  Radiology: No results found.   Procedures   Medications Ordered in the ED  dextrose  5 % and 0.45 % NaCl infusion (has no administration in time range)  ketorolac  (TORADOL ) 15 MG/ML injection 15 mg (has no administration in time range)  HYDROmorphone  (DILAUDID ) injection 2 mg (has no administration in time range)  HYDROmorphone  (DILAUDID ) injection 2 mg (has no administration in time range)  HYDROmorphone  (DILAUDID ) injection 2 mg (has no administration in time range)  ondansetron  (ZOFRAN ) injection 4 mg (has no administration in time range)                                    Medical Decision Making Amount and/or Complexity of Data Reviewed Labs: ordered.  Risk Prescription drug management.   This patient is a 33 y.o. male who presents to the ED for concern of sickle cell pain crises, this involves an extensive number of treatment options, and is a complaint that carries with it a high risk of complications and morbidity.   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  Additional history: Chart reviewed. Pertinent results include: Several ER visits across various facilities for similar pain.  Physical Exam: Physical exam performed. The pertinent findings include: No acute physical exam abnormalities.  Lab Tests: I ordered, and personally interpreted labs.  The pertinent results include: Reticulocyte elevation consistent with previous, consistent with  pain crises.  WBC 11.6.  Hemoglobin consistent with previous.  Medications: I ordered medication including fluids, Toradol , Dilaudid  for pain. Reevaluation of the patient after these medicines showed that the patient improved. I have reviewed the patients home medicines and have made adjustments as needed.   Disposition: After consideration of the diagnostic results and the patients response to treatment, I feel that emergency department workup does not suggest an emergent condition requiring admission or immediate intervention beyond what has been performed at this time. The plan is: Discharge with close outpatient follow-up and return precautions.  After above interventions patient is feeling better and ready to go home.  He has no signs or symptoms to suggest acute chest.  Does not feel like he needs admission at this time.  He feels ready to go home. Evaluation and diagnostic testing in the emergency department does not suggest an emergent condition requiring admission or immediate intervention beyond what has been performed at this time.  Plan for discharge with close PCP follow-up.  Patient is understanding and amenable with plan, educated on red flag symptoms that would prompt immediate return.  Patient discharged in stable condition.  Findings and plan of care discussed with supervising physician Dr. Leighton Punches who is in agreement.    Final diagnoses:  Sickle cell pain crisis [facility])    ED Discharge Orders     None     An After Visit Summary was printed and given to the patient.      Fredna Jasper 08/03/23 2139    Lind Repine, MD 08/03/23 2300

## 2023-08-03 NOTE — Transitions of Care (Post Inpatient/ED Visit) (Signed)
   08/03/2023  Name: DALVIN CLIPPER MRN: [id number] DOB: Jan 10, 1991  Today's TOC FU Call Status: Today's TOC FU Call Status:: Unsuccessful Call (3rd Attempt) Unsuccessful Call (3rd Attempt) Date: 08/03/23  Attempted to reach the patient regarding the most recent Inpatient/ED visit.  Follow Up Plan: No further outreach attempts will be made at this time. We have been unable to contact the patient.  Tonia Frankel RN, CCM [place]  VBCI-Population Health RN Care Manager [phone]

## 2023-08-03 NOTE — ED Triage Notes (Signed)
 Pt reports having sickle cell pain crisis x2 days. Pt reports running out of Percocet. Pt endorses pain in bilateral arms and back.

## 2023-08-03 NOTE — Discharge Instructions (Signed)
 As we discussed, your workup in the ER today was reassuring for acute findings.  Laboratory evaluation did not reveal any emergent concerns.  Please follow-up with your PCP for refill of your medicine.  Given that you are feeling better, no further evaluation is indicated.  Return if development of any new or worsening symptoms.

## 2023-08-04 ENCOUNTER — Other Ambulatory Visit (HOSPITAL_COMMUNITY): Payer: Self-pay

## 2023-08-05 DIAGNOSIS — D649 Anemia, unspecified: Secondary | ICD-10-CM | POA: Diagnosis not present

## 2023-08-05 DIAGNOSIS — D5700 Hb-SS disease with crisis, unspecified: Secondary | ICD-10-CM | POA: Diagnosis not present

## 2023-08-05 DIAGNOSIS — J811 Chronic pulmonary edema: Secondary | ICD-10-CM | POA: Diagnosis not present

## 2023-08-06 DIAGNOSIS — F419 Anxiety disorder, unspecified: Secondary | ICD-10-CM | POA: Diagnosis not present

## 2023-08-06 DIAGNOSIS — Z136 Encounter for screening for cardiovascular disorders: Secondary | ICD-10-CM | POA: Diagnosis not present

## 2023-08-06 DIAGNOSIS — J8283 Eosinophilic asthma: Secondary | ICD-10-CM | POA: Diagnosis not present

## 2023-08-06 DIAGNOSIS — D638 Anemia in other chronic diseases classified elsewhere: Secondary | ICD-10-CM | POA: Diagnosis not present

## 2023-08-06 DIAGNOSIS — M5489 Other dorsalgia: Secondary | ICD-10-CM | POA: Diagnosis not present

## 2023-08-06 DIAGNOSIS — D649 Anemia, unspecified: Secondary | ICD-10-CM | POA: Diagnosis not present

## 2023-08-06 DIAGNOSIS — Z5321 Procedure and treatment not carried out due to patient leaving prior to being seen by health care provider: Secondary | ICD-10-CM | POA: Diagnosis not present

## 2023-08-06 DIAGNOSIS — F1290 Cannabis use, unspecified, uncomplicated: Secondary | ICD-10-CM | POA: Diagnosis not present

## 2023-08-06 DIAGNOSIS — M25512 Pain in left shoulder: Secondary | ICD-10-CM | POA: Diagnosis not present

## 2023-08-06 DIAGNOSIS — R079 Chest pain, unspecified: Secondary | ICD-10-CM | POA: Diagnosis not present

## 2023-08-06 DIAGNOSIS — D57219 Sickle-cell/Hb-C disease with crisis, unspecified: Secondary | ICD-10-CM | POA: Diagnosis not present

## 2023-08-06 DIAGNOSIS — Z86718 Personal history of other venous thrombosis and embolism: Secondary | ICD-10-CM | POA: Diagnosis not present

## 2023-08-06 DIAGNOSIS — G8929 Other chronic pain: Secondary | ICD-10-CM | POA: Diagnosis not present

## 2023-08-06 DIAGNOSIS — R918 Other nonspecific abnormal finding of lung field: Secondary | ICD-10-CM | POA: Diagnosis not present

## 2023-08-06 DIAGNOSIS — F17210 Nicotine dependence, cigarettes, uncomplicated: Secondary | ICD-10-CM | POA: Diagnosis not present

## 2023-08-06 DIAGNOSIS — J45909 Unspecified asthma, uncomplicated: Secondary | ICD-10-CM | POA: Diagnosis not present

## 2023-08-06 DIAGNOSIS — F32A Depression, unspecified: Secondary | ICD-10-CM | POA: Diagnosis not present

## 2023-08-06 DIAGNOSIS — M879 Osteonecrosis, unspecified: Secondary | ICD-10-CM | POA: Diagnosis not present

## 2023-08-06 DIAGNOSIS — M549 Dorsalgia, unspecified: Secondary | ICD-10-CM | POA: Diagnosis not present

## 2023-08-06 DIAGNOSIS — J90 Pleural effusion, not elsewhere classified: Secondary | ICD-10-CM | POA: Diagnosis not present

## 2023-08-06 DIAGNOSIS — D5700 Hb-SS disease with crisis, unspecified: Secondary | ICD-10-CM | POA: Diagnosis not present

## 2023-08-13 NOTE — Discharge Summary (Signed)
 Hospitalist  Discharge Summary   Name: Timothy Marks. Age: 33 yrs  MRN: [id number] DOB: Oct 15, 1990  Admit Date: 08/06/2023 Admitting Physician: Elsie Reyes Jerry, MD  Discharge Date: 08/13/2023 Discharge Physician: Fonda KATHEE Louder, MD    Admission Diagnosis:   Sickle cell pain crisis    (CMD) [D57.00]   Discharge Diagnoses:   Principal Problem:   Sickle cell pain crisis [facility]) Resolved Problems:   * No resolved hospital problems. *    *please see last page for Past Medical History and active Problem List  TO DO List at Follow-up for PCP/Specialist:   Key Medication changes: as noted Pending labs to follow up on: n/a Incidental findings requiring follow-up: n/a Other: n/a  Indication for Hospitalization/Hospital Course:   For the details of admission, please see the H&P on 08/06/2023.  For full details, please see progress notes, consult notes and ancillary notes.  The patient's hospital course will be summarized in a problem based approach below.   Timothy Marks. is a 33 y.o. male with history significant for hx Hb-SS, tobacco and marijuana use, hyperbilirubinemia, Hx DVT on eliquis , Hx osteomyelitis, anxiety and depression, admitted for acute sickle cell pain crisis.   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 Pain consulted for ketamine  gtt. Now transitioned to PO opioids; titrate to effect; will discharge on 5-day course w/ plan for patient to follow up w/ primary physician for refills. -Obtained XR L shoulder/scapula which demonstrates AVN of humeral head. Will plan for OP f/u with Orthopedics to discuss further mgmt.   The patient's chronic medical conditions were treated accordingly per the patient's home medication regimen except as noted in the plan above and in the medication list below.    Discharge Condition:   Disposition: Patient  discharged to Home or Self Care in good condition. No discharge procedures on file.  Wound/Incision Care Instructions: n/a   Physical Exam at Discharge   BP 133/68 (BP Location: Right leg, Patient Position: Lying)   Pulse 84   Temp 98.2 F (36.8 C) (Oral)   Resp 18   Ht 1.88 m (6' 2.02)   Wt 53.2 kg (117 lb 3.2 oz)   SpO2 99%   BMI 15.04 kg/m  At the time of discharge, the patient was seen and examined by the attending physician, and was deemed appropriate for discharge.  Physical Exam General: Alert/oriented x 4, no distress  Head-Eyes-Ears-Nose-Throat: EOMI, pupils equal and reactive,  oropharynx clear, no LAD.  Cardiovascular: Reg rate/rhythm, s1, s2, no murmurs.  Chest/Lungs: Clear to auscultation b/l, no wheezes or rhonchi.  Abdomen: +BS, soft, nontender/nondistended  Extremities: 2+ peripheral pulses  Skin: No rash/ulceration      Discharge Medications:      Medication List    Some of the medications listed here do not show instructions, such as how often to take the medication. Ask your doctor or nurse how to use these medications. Specifically ask about this and similar medications: naloxone  1 mg/mL injection    PAUSE taking these medications    oxyCODONE -acetaminophen  10-325 mg per tablet Wait to take this until: August 18, 2023 Commonly known as: PERCOCET Take 1 tablet by mouth every 4 (four) hours as needed for moderate pain (4-6).       START taking these medications    HYDROmorphone  2 mg tablet Commonly known as: DILAUDID  Take 5 tablets (10 mg total) by  mouth every 4 (four) hours for 5 days.       CHANGE how you take these medications    * naloxone  1 mg/mL injection __________________________ What changed: See the new instructions.   * naloxone  4 mg/actuation Spry nasal spray Commonly known as: NARCAN  Administer 1 spray into affected nostril(s) as needed for opioid reversal. What changed: Another medication with the same name was changed.  Make sure you understand how and when to take each.      * * This list has 2 medication(s) that are the same as other medications prescribed for you. Read the directions carefully, and ask your doctor or other care provider to review them with you.          CONTINUE taking these medications    apixaban  5 mg Tab Commonly known as: ELIQUIS  Take 5 mg by mouth 2 (two) times a day.   diphenhydrAMINE  25 mg capsule Commonly known as: BENADRYL  Take 25 mg by mouth every 6 (six) hours as needed for itching.   ergocalciferol  1,250 mcg (50,000 unit) capsule Commonly known as: VITAMIN D2 Take 50,000 Units by mouth once a week for 90 days.   folic acid  1 mg tablet Commonly known as: FOLVITE  Take 1 mg by mouth daily.   hydroxyUREA  500 mg capsule Commonly known as: HYDREA  Take 500 mg by mouth 2 (two) times a day.   hydrOXYzine  25 mg tablet Commonly known as: ATARAX  Take 25 mg by mouth 3 (three) times a day as needed for anxiety. Indications: anxious   ibuprofen  800 mg tablet Commonly known as: MOTRIN  Take 800 mg by mouth every 6 (six) hours as needed for mild pain (1-3) or moderate pain (4-6).   mirtazapine  7.5 mg tablet Commonly known as: REMERON  Take 7.5 mg by mouth at bedtime.       STOP taking these medications    cholecalciferol  1,000 unit (25 mcg) tablet Commonly known as: VITAMIN D3   cyanocobalamin 500 mcg tablet Commonly known as: VITAMIN B12         Where to Get Your Medications     These medications were sent to [id number] - [place], [place] - [address] AT [facility] [address] - PHONE: [phone] - FAX: [phone]  [address], [place] [address]    Phone: [phone]  HYDROmorphone  2 mg tablet     Significant Diagnostic Tests:   As noted  Surgeries/Procedures:    Other procedures performed: n/a  Consults:   IP CONSULT TO PAIN MANAGEMENT   Follow-up Appointments:    No future appointments.        Contact information during this hospitalization (Please re-verify post-discharge): PCP: Folashade Ruth Paseda, FNP PCP Phone: [phone]       @[email or web address] @  Medical History[1] Patient Active Problem List   Diagnosis Date Noted  . Tobacco use 07/28/2023  . Marijuana use 07/28/2023  . 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 MSSA (methicillin susceptible Staphylococcus aureus) septicemia (HCC) 06/23/2021  . MSSA bacteremia 05/19/2021  . Sickle cell pain crisis (HCC) 05/11/2021  . Sickle-cell disease with pain (HCC) 04/12/2021  . Leg DVT (deep venous thromboembolism), acute, left (HCC) 04/03/2020  . Acute chest syndrome due to Hgb-S disease (HCC) 04/09/2019  . Thrombocytosis 04/09/2019  . Abnormal liver function 11/04/2018  . Sickle cell anemia (HCC) 10/09/2018  . Anemia of chronic disease 05/31/2018  . 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 Narcotic  dependence (HCC) 02/10/2018  . Socially inappropriate behavior 12/02/2017  . Drug-seeking behavior 08/30/2017  . History of DVT (deep vein thrombosis) 11/17/2016  . 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) 08/13/2016  . Constipation due to opioid therapy 11/11/2014  . 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 Vitamin D  deficiency 04/17/2012  . 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        [1] Past Medical History: Diagnosis Date  . Acute chest syndrome due to sickle cell crisis    (CMD) 11/08/2014  . Acute chest syndrome(517.3)   . Acute midline low back pain without sciatica 07/22/2017  . Acute on chronic anemia 11/08/2014  . Anxiety 09/15/2020  . Asthma (CMD)    childhood  . Attempted suicide    (CMD)   . Chronic pain 03/14/2011  . Community acquired pneumonia of left lower lobe of  lung 04/09/2019  . Depression   . DVT (deep venous thrombosis)    (CMD) 12/21/2017  . Generalized weakness 03/31/2019  . Hyperbilirubinemia 03/30/2014  . Leucocytosis 06/12/2011  . Osteomyelitis of left lower limb    (CMD)   . Osteomyelitis of left shoulder region    (CMD)   . Pneumonia   . Sickle cell anemia    (CMD)   . Sickle cell anemia    (CMD)   . Sickle cell crisis    (CMD) 10/08/2018  . Sickle cell disease    (CMD)   . SIRS (systemic inflammatory response syndrome)    (CMD) 02/10/2019  . Thrombocythemia 05/31/2018  . Transfusion history   *Some images could not be shown.

## 2023-08-14 ENCOUNTER — Telehealth: Payer: Self-pay

## 2023-08-14 ENCOUNTER — Other Ambulatory Visit: Payer: Self-pay | Admitting: Nurse Practitioner

## 2023-08-14 ENCOUNTER — Other Ambulatory Visit (HOSPITAL_COMMUNITY): Payer: Self-pay

## 2023-08-14 DIAGNOSIS — G894 Chronic pain syndrome: Secondary | ICD-10-CM

## 2023-08-14 DIAGNOSIS — D571 Sickle-cell disease without crisis: Secondary | ICD-10-CM

## 2023-08-14 MED ORDER — OXYCODONE-ACETAMINOPHEN 10-325 MG PO TABS
1.0000 | ORAL_TABLET | ORAL | 0 refills | Status: DC | PRN
  Filled 2023-08-19: qty 90, 15d supply, fill #0
  Filled ????-??-??: fill #0

## 2023-08-14 NOTE — Transitions of Care (Post Inpatient/ED Visit) (Signed)
   08/14/2023  Name: Timothy Marks MRN: [id number] DOB: 12-Apr-1990  Today's TOC FU Call Status: Unsuccessful Call (1st Attempt) Date: 08/14/23  Attempted to reach the patient regarding the most recent Inpatient/ED visit.  Follow Up Plan: Additional outreach attempts will be made to reach the patient to complete the Transitions of Care (Post Inpatient/ED visit) call.   Daveion Robar J. Liba Hulsey RN, MSN [facility], [facility] RN Care Manager Direct Dial: [phone]  Fax: [phone] Website: [email or web address]

## 2023-08-14 NOTE — Progress Notes (Signed)
 Reviewed PDMP substance reporting system prior to prescribing opiate medications. No inconsistencies noted.   1. Sickle cell disease without crisis (HCC)  - oxyCODONE-acetaminophen (PERCOCET) 10-325 MG tablet; Take 1 tablet by mouth every 4 (four) hours as needed for up to 15 days for pain.  Dispense: 90 tablet; Refill: 0  2. Chronic pain syndrome  - oxyCODONE-acetaminophen (PERCOCET) 10-325 MG tablet; Take 1 tablet by mouth every 4 (four) hours as needed for up to 15 days for pain.  Dispense: 90 tablet; Refill: 0

## 2023-08-15 ENCOUNTER — Telehealth: Payer: Self-pay

## 2023-08-15 NOTE — Transitions of Care (Post Inpatient/ED Visit) (Signed)
   08/15/2023  Name: RYU CERRETA MRN: [id number] DOB: 08-14-1990  Today's TOC FU Call Status: Today's TOC FU Call Status:: Unsuccessful Call (2nd Attempt) Unsuccessful Call (2nd Attempt) Date: 08/15/23  Attempted to reach the patient regarding the most recent Inpatient/ED visit.  Follow Up Plan: Additional outreach attempts will be made to reach the patient to complete the Transitions of Care (Post Inpatient/ED visit) call.   Juwan Vences J. Iverna Hammac RN, MSN [facility], [facility] RN Care Manager Direct Dial: [phone]  Fax: [phone] Website: [email or web address]

## 2023-08-16 ENCOUNTER — Telehealth: Payer: Self-pay | Admitting: *Deleted

## 2023-08-16 NOTE — Transitions of Care (Post Inpatient/ED Visit) (Signed)
   08/16/2023  Name: Timothy Marks MRN: [id number] DOB: May 21, 1990  Today's TOC FU Call Status: Today's TOC FU Call Status:: Unsuccessful Call (3rd Attempt) Unsuccessful Call (3rd Attempt) Date: 08/16/23  Attempted to reach the patient regarding the most recent Inpatient/ED visit.  Follow Up Plan: No further outreach attempts will be made at this time. We have been unable to contact the patient.  Mliss Creed [facility], BSN RN Care Manager/ Transition of Care [place]/ [facility] [phone]

## 2023-08-19 ENCOUNTER — Other Ambulatory Visit (HOSPITAL_COMMUNITY): Payer: Self-pay

## 2023-08-30 ENCOUNTER — Other Ambulatory Visit: Payer: Self-pay | Admitting: Nurse Practitioner

## 2023-08-30 ENCOUNTER — Ambulatory Visit (INDEPENDENT_AMBULATORY_CARE_PROVIDER_SITE_OTHER): Payer: Self-pay | Admitting: Nurse Practitioner

## 2023-08-30 ENCOUNTER — Other Ambulatory Visit (HOSPITAL_COMMUNITY): Payer: Self-pay

## 2023-08-30 ENCOUNTER — Encounter: Payer: Self-pay | Admitting: Nurse Practitioner

## 2023-08-30 VITALS — BP 113/63 | HR 84 | Temp 97.3°F | Wt 123.6 lb

## 2023-08-30 DIAGNOSIS — Z86718 Personal history of other venous thrombosis and embolism: Secondary | ICD-10-CM

## 2023-08-30 DIAGNOSIS — G894 Chronic pain syndrome: Secondary | ICD-10-CM | POA: Diagnosis not present

## 2023-08-30 DIAGNOSIS — D571 Sickle-cell disease without crisis: Secondary | ICD-10-CM

## 2023-08-30 DIAGNOSIS — E559 Vitamin D deficiency, unspecified: Secondary | ICD-10-CM | POA: Diagnosis not present

## 2023-08-30 DIAGNOSIS — Z113 Encounter for screening for infections with a predominantly sexual mode of transmission: Secondary | ICD-10-CM

## 2023-08-30 DIAGNOSIS — M90512 Osteonecrosis in diseases classified elsewhere, left shoulder: Secondary | ICD-10-CM

## 2023-08-30 MED ORDER — FOLIC ACID 1 MG PO TABS
1.0000 mg | ORAL_TABLET | Freq: Every day | ORAL | 2 refills | Status: AC
  Filled 2023-08-30: qty 90, 90d supply, fill #0

## 2023-08-30 MED ORDER — APIXABAN 5 MG PO TABS
5.0000 mg | ORAL_TABLET | Freq: Two times a day (BID) | ORAL | 3 refills | Status: AC
  Filled 2023-08-30: qty 120, 60d supply, fill #0
  Filled 2023-10-29: qty 120, 60d supply, fill #1

## 2023-08-30 MED ORDER — IBUPROFEN 800 MG PO TABS
800.0000 mg | ORAL_TABLET | Freq: Three times a day (TID) | ORAL | 1 refills | Status: AC | PRN
  Filled 2023-08-30: qty 30, 10d supply, fill #0

## 2023-08-30 MED ORDER — OXYCODONE-ACETAMINOPHEN 10-325 MG PO TABS
1.0000 | ORAL_TABLET | ORAL | 0 refills | Status: DC | PRN
  Filled 2023-09-04: qty 90, 15d supply, fill #0
  Filled ????-??-??: fill #0

## 2023-08-30 NOTE — Patient Instructions (Signed)
 [address] [place] [address]  P:  [phone] F:  [phone]  It is important that you exercise regularly at least 30 minutes 5 times a week as tolerated  Think about what you will eat, plan ahead. Choose " clean, green, fresh or frozen" over canned, processed or packaged foods which are more sugary, salty and fatty. 70 to 75% of food eaten should be vegetables and fruit. Three meals at set times with snacks allowed between meals, but they must be fruit or vegetables. Aim to eat over a 12 hour period , example 7 am to 7 pm, and STOP after  your last meal of the day. Drink water,generally about 64 ounces per day, no other drink is as healthy. Fruit juice is best enjoyed in a healthy way, by EATING the fruit.  Thanks for choosing [facility] we consider it a privelige to serve you.

## 2023-08-30 NOTE — Assessment & Plan Note (Signed)
 Continue ibuprofen  800 mg 3 times daily as needed, oxycodone  10 325 mg 1 tablet every 4 hours as needed Patient referred to orthopedics

## 2023-08-30 NOTE — Assessment & Plan Note (Addendum)
 sickle cell disease - Continue Hydrea  500 mg BID. Folic acid  1 mg daily to prevent aplastic bone marrow crises.  We discussed the need for good hydration, monitoring of hydration status, avoidance of heat, cold, stress, and infection triggers. The patient was reminded of the need to seek medical attention of any symptoms of bleeding, anemia, or infection.      Eye - High risk of proliferative retinopathy.  States that he will make an Appointment with the ophthalmologist at [facility], need for yearly eye exam discussed.    Acute and chronic painful episodes -  .  We discussed that pt is to receive her Schedule II prescriptions only from us . Pt is also aware that the prescription history is available to us  online through the [facility] CSRS. We reminded the patient that all patients receiving Schedule II narcotics must be seen for follow within the 3 months in the office.  We reviewed the terms of our pain agreement, including the need to keep medicines in a safe locked location away from children or pets, and the need to report excess sedation or constipation, measures to avoid constipation. According to the [place] [facility] program, we have reviewed details related to analgesia, adverse effects, aberrant behaviors. Reviewed [place] Substance Reporting system prior to prescribing opiate medication, no inconsistencies noted.    Continue Percocet 10 325 mg 1 tablet every 4 hours as needed for moderate pain, Ibuprofen  800 mg every 8 hours as needed for mild pain    He has not called not received call form hematologist at [facility] health your phone number was provided advised to call them to schedule an appointment.  Sickle cell panel, urine drug screen

## 2023-08-30 NOTE — Assessment & Plan Note (Signed)
 Continue ibuprofen  800 mg 3 times daily as needed, Percocet 10-325 mg 1 tablet every 4 hours as needed

## 2023-08-30 NOTE — Progress Notes (Signed)
 Reviewed PDMP substance reporting system prior to prescribing opiate medications. No inconsistencies noted.   1. Sickle cell disease without crisis (HCC)  - oxyCODONE-acetaminophen (PERCOCET) 10-325 MG tablet; Take 1 tablet by mouth every 4 (four) hours as needed for up to 15 days for pain.  Dispense: 90 tablet; Refill: 0  2. Chronic pain syndrome  - oxyCODONE-acetaminophen (PERCOCET) 10-325 MG tablet; Take 1 tablet by mouth every 4 (four) hours as needed for up to 15 days for pain.  Dispense: 90 tablet; Refill: 0

## 2023-08-30 NOTE — Progress Notes (Signed)
 Established Patient Office Visit  Subjective:  Patient ID: Timothy Marks, male    DOB: 30-Jan-1991  Age: 33 y.o. MRN: [id number]  CC:  Chief Complaint  Patient presents with   Sickle Cell Anemia   sti screening    HPI Timothy Marks is a 33 y.o. 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  Patient presents for follow-up for his chronic medical conditions   Sickle cell disease .patient was on admission at [facility] on 08/06/2023  to 08/13/2023 for sickle cell crisis.  He was treated with IV pain medications while at the hospital discharged home on Dilaudid  2 mg by mouth every 4 hours for 5 days with instructions to resume Percocet after completion of Dilaudid .  Stated that he is doing fine today, his sickle cell pain is currently rated 5/10 and it is mainly in his lower back, Percocet was last taken yesterday.  Takes ibuprofen  800 mg 3 times daily as needed mild pain currently denies fever, chills, chest pain, shortness of breath abdominal pain, nausea, vomiting.   AVN of left humeral head.  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   Sickle cell anemia (HCC) 05-03-88   Vitamin D  deficiency 2015    Past Surgical History:  Procedure Laterality Date   CHOLECYSTECTOMY      Family History  Problem Relation Age of Onset   Sickle cell trait Mother    Sickle cell trait Father     Social History   Socioeconomic History    Marital status: Single    Spouse name: Not on file   Number of children: Not on file   Years of education: Not on file   Highest education level: 12th grade  Occupational History   Not on file  Tobacco Use   Smoking status: Former    Current packs/day: 0.00    Types: Cigarettes    Passive exposure: Past   Smokeless tobacco: Never  Vaping Use   Vaping status: Never Used  Substance and Sexual Activity   Alcohol  use: No   Drug use: Yes    Frequency: 7.0 times per week    Types: Marijuana   Sexual activity: Yes    Birth control/protection: None  Other Topics Concern   Not on file  Social History Narrative   Not on file   Social Drivers of Health   Financial Resource Strain: Low Risk  (05/04/2023)   Overall Financial Resource Strain (CARDIA)    Difficulty of Paying Living Expenses: Not hard at all  Food Insecurity: Low Risk  (08/07/2023)   Received from [facility]   Hunger Vital Sign    Within the past 12 months, you worried that your food would run out before you got money to buy more: Never true    Within the past 12 months, the food you bought just didn't last and you didn't have money to get more. : Never true  Transportation Needs: No Transportation Needs (  08/07/2023)   Received from [facility]   Transportation    In the past 12 months, has lack of reliable transportation kept you from medical appointments, meetings, work or from getting things needed for daily living? : No  Physical Activity: Sufficiently Active (05/04/2023)   Exercise Vital Sign    Days of Exercise per Week: 3 days    Minutes of Exercise per Session: 60 min  Stress: Stress Concern Present (05/04/2023)   [employer] of Occupational Health - Occupational Stress Questionnaire    Feeling of Stress : Very much  Social Connections: Unknown (05/04/2023)   Social Connection and Isolation Panel    Frequency of Communication with Friends and Family: Once a week    Frequency of Social Gatherings with  Friends and Family: Once a week    Attends Religious Services: Patient declined    Database administrator or Organizations: No    Attends Banker Meetings: Never    Marital Status: Patient declined  Catering manager Violence: Not At Risk (08/05/2023)   Received from [facility]   HITS    Over the last 12 months how often did your partner physically hurt you?: Never    Over the last 12 months how often did your partner insult you or talk down to you?: Never    Over the last 12 months how often did your partner threaten you with physical harm?: Never    Over the last 12 months how often did your partner scream or curse at you?: Never    Outpatient Medications Prior to Visit  Medication Sig Dispense Refill   apixaban  (ELIQUIS ) 5 MG TABS tablet Take 1 tablet (5 mg total) by mouth 2 (two) times daily. 60 tablet 6   diphenhydrAMINE  (BENADRYL ) 25 mg capsule Take 1 capsule (25 mg total) by mouth every 6 (six) hours as needed for itching. 30 capsule 1   ergocalciferol  (VITAMIN D2) 1.25 MG (50000 UT) capsule Take 1 capsule (50,000 Units total) by mouth once a week. 14 capsule 1   folic acid  (FOLVITE ) 1 MG tablet Take 1 tablet (1 mg total) by mouth daily. 90 tablet 2   hydrocortisone  1 % ointment Apply 1 Application topically 2 (two) times daily. 30 g 0   hydroxyurea  (HYDREA ) 500 MG capsule Take 1 capsule (500 mg total) by mouth 2 (two) times daily. May take with food to minimize GI side effects. 180 capsule 2   hydrOXYzine  (ATARAX ) 25 MG tablet Take 1 tablet (25 mg total) by mouth daily as needed for anxiety or vomiting. 30 tablet 1   ibuprofen  (ADVIL ) 800 MG tablet Take 1 tablet (800 mg total) by mouth every 8 (eight) hours as needed. 30 tablet 1   oxyCODONE -acetaminophen  (PERCOCET) 10-325 MG tablet Take 1 tablet by mouth every 4 (four) hours as needed for up to 15 days for pain. 90 tablet 0   gabapentin  (NEURONTIN ) 300 MG capsule Take 1 capsule (300 mg total) by mouth 3 (three) times  daily. (Patient not taking: Reported on 08/30/2023) 90 capsule 5   mirtazapine  (REMERON ) 7.5 MG tablet Take 1 tablet (7.5 mg total) by mouth at bedtime as needed (for sleep). (Patient not taking: Reported on 08/30/2023) 30 tablet 1   naloxone  (NARCAN ) nasal spray 4 mg/0.1 mL Place 1 spray into the nose once. (Patient not taking: Reported on 08/30/2023)     No facility-administered medications prior to visit.    Allergies  Allergen Reactions   Buprenorphine Hcl Hives  Levaquin  [Levofloxacin ] Itching   Meperidine Rash   Morphine  Hives    Takes MS IR at home   Morphine  And Codeine Hives    Takes MSIR PTA   Tramadol Hives   Vancomycin Itching   Zosyn [Piperacillin Sod-Tazobactam So] Itching, Rash and Other (See Comments)    Has taken rocephin  in the past    ROS Review of Systems    Objective:    Physical Exam Vitals and nursing note reviewed.  Constitutional:      General: He is not in acute distress.    Appearance: Normal appearance. He is not ill-appearing, toxic-appearing or diaphoretic.  Eyes:     General:        Right eye: No discharge.        Left eye: No discharge.     Extraocular Movements: Extraocular movements intact.     Conjunctiva/sclera: Conjunctivae normal.  Cardiovascular:     Rate and Rhythm: Normal rate and regular rhythm.     Pulses: Normal pulses.     Heart sounds: Normal heart sounds. No murmur heard.    No friction rub. No gallop.  Pulmonary:     Effort: Pulmonary effort is normal. No respiratory distress.     Breath sounds: Normal breath sounds. No stridor. No wheezing, rhonchi or rales.  Chest:     Chest wall: No tenderness.  Abdominal:     General: There is no distension.     Palpations: Abdomen is soft.     Tenderness: There is no abdominal tenderness. There is no right CVA tenderness, left CVA tenderness or guarding.  Musculoskeletal:        General: Tenderness present. No swelling, deformity or signs of injury.     Right lower leg: No  edema.     Left lower leg: No edema.     Comments: Tenderness on palpation of low back and range of motion of left shoulder  Skin:    General: Skin is warm and dry.     Capillary Refill: Capillary refill takes less than 2 seconds.     Coloration: Skin is not jaundiced or pale.     Findings: No bruising, erythema or lesion.  Neurological:     Mental Status: He is alert and oriented to person, place, and time.     Motor: No weakness.     Coordination: Coordination normal.     Gait: Gait normal.  Psychiatric:        Mood and Affect: Mood normal.        Behavior: Behavior normal.        Thought Content: Thought content normal.        Judgment: Judgment normal.     BP 113/63   Pulse 84   Temp (!) 97.3 F (36.3 C)   Wt 123 lb 9.6 oz (56.1 kg)   SpO2 100%   BMI 15.87 kg/m  Wt Readings from Last 3 Encounters:  08/30/23 123 lb 9.6 oz (56.1 kg)  08/03/23 135 lb (61.2 kg)  06/19/23 115 lb (52.2 kg)    Lab Results  Component Value Date   TSH 0.884 08/31/2018   Lab Results  Component Value Date   WBC 11.6 (H) 08/03/2023   HGB 8.8 (L) 08/03/2023   HCT 24.8 (L) 08/03/2023   MCV 94.3 08/03/2023   PLT 513 (H) 08/03/2023   Lab Results  Component Value Date   NA 139 08/03/2023   K 4.0 08/03/2023   CO2 23 08/03/2023   GLUCOSE  88 08/03/2023   BUN 6 08/03/2023   CREATININE 0.63 08/03/2023   BILITOT 1.2 08/03/2023   ALKPHOS 80 08/03/2023   AST 22 08/03/2023   ALT 15 08/03/2023   PROT 7.8 08/03/2023   ALBUMIN 4.3 08/03/2023   CALCIUM 9.6 08/03/2023   ANIONGAP 13 08/03/2023   EGFR 127 06/19/2023   No results found for: CHOL No results found for: HDL No results found for: LDLCALC No results found for: TRIG No results found for: CHOLHDL No results found for: YHAJ8R    Assessment & Plan:   Problem List Items Addressed This Visit       Musculoskeletal and Integument   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 Folic acid  1 mg daily to prevent aplastic bone marrow crises.  We discussed the need for good hydration, monitoring of hydration status, avoidance of heat, cold, stress, and infection triggers. The patient was reminded of the need to seek medical attention of any symptoms of bleeding, anemia, or infection.      Eye - High risk of proliferative retinopathy.  States that he will make an Appointment with the ophthalmologist at [facility], need for yearly eye exam discussed.    Acute and chronic painful episodes -  .  We discussed that pt is to receive her Schedule II prescriptions only from us . Pt is also aware that the prescription history is available to us  online through the [facility] CSRS. We reminded the patient that all patients receiving Schedule II narcotics must be seen for follow within the 3 months in the office.  We reviewed the terms of our pain agreement, including the need to keep medicines in a safe locked location away from children or pets, and the need to report excess sedation or constipation, measures to avoid constipation. According to the [place] [facility] program, we have reviewed details related to analgesia, adverse effects, aberrant behaviors. Reviewed [place] Substance Reporting system prior to prescribing opiate medication, no inconsistencies noted.    Continue Percocet 10 325 mg 1 tablet every 4 hours as needed for moderate pain, Ibuprofen  800 mg every 8 hours as needed for mild pain    He has not called not received call form hematologist at [facility] your phone number was provided advised to call them to schedule an appointment.  Sickle cell panel, urine drug  screen      Relevant Orders   ToxAssure Flex 15, Ur   Sickle Cell Panel   Vitamin D  deficiency (Chronic)   Last vitamin D  Lab Results  Component Value Date   VD25OH 51.0 06/19/2023  Controlled on vitamin D  50,000 units once weekly      Hyperbilirubinemia   Lab Results  Component Value Date   ALT 15 08/03/2023   AST 22 08/03/2023   ALKPHOS 80 08/03/2023   BILITOT 1.2 08/03/2023         Chronic pain syndrome   Continue ibuprofen  800 mg 3 times daily as needed, Percocet 10-325 mg 1 tablet every 4 hours as needed      History of DVT (deep vein thrombosis)   Continue  Eliquis  5 mg twice daily      Other Visit Diagnoses       Screen for STD (sexually transmitted disease)       Relevant Orders   HepB+HepC+HIV Panel   Chlamydia/Gonococcus/Trichomonas, NAA   RPR       No orders of the defined types were placed in this encounter.   Follow-up: Return in about 3 months (around 11/30/2023) for CPE.    Jamicheal Heard R Antionio Negron, FNP

## 2023-08-30 NOTE — Assessment & Plan Note (Addendum)
 Last vitamin D  Lab Results  Component Value Date   VD25OH 51.0 06/19/2023  Controlled on vitamin D  50,000 units once weekly

## 2023-08-30 NOTE — Assessment & Plan Note (Signed)
 Continue Eliquis 5 mg twice daily

## 2023-08-30 NOTE — Assessment & Plan Note (Signed)
 Lab Results  Component Value Date   ALT 15 08/03/2023   AST 22 08/03/2023   ALKPHOS 80 08/03/2023   BILITOT 1.2 08/03/2023

## 2023-08-31 LAB — CMP14+CBC/D/PLT+FER+RETIC+V...
ALT: 10 IU/L (ref 0–44)
AST: 34 IU/L (ref 0–40)
Albumin: 4.4 g/dL (ref 4.1–5.1)
Alkaline Phosphatase: 83 IU/L (ref 44–121)
BUN/Creatinine Ratio: 9 (ref 9–20)
BUN: 5 mg/dL — ABNORMAL LOW (ref 6–20)
Basophils Absolute: 0 x10E3/uL (ref 0.0–0.2)
Basos: 1 %
Bilirubin Total: 0.6 mg/dL (ref 0.0–1.2)
CO2: 16 mmol/L — ABNORMAL LOW (ref 20–29)
Calcium: 9.5 mg/dL (ref 8.7–10.2)
Chloride: 102 mmol/L (ref 96–106)
Creatinine, Ser: 0.56 mg/dL — ABNORMAL LOW (ref 0.76–1.27)
EOS (ABSOLUTE): 0.1 x10E3/uL (ref 0.0–0.4)
Eos: 2 %
Ferritin: 1815 ng/mL — ABNORMAL HIGH (ref 30–400)
Globulin, Total: 3.5 g/dL (ref 1.5–4.5)
Glucose: 77 mg/dL (ref 70–99)
Hematocrit: 39.1 % (ref 37.5–51.0)
Hemoglobin: 12 g/dL — ABNORMAL LOW (ref 13.0–17.7)
Immature Grans (Abs): 0 x10E3/uL (ref 0.0–0.1)
Immature Granulocytes: 0 %
Lymphocytes Absolute: 2 x10E3/uL (ref 0.7–3.1)
Lymphs: 39 %
MCH: 29.6 pg (ref 26.6–33.0)
MCHC: 30.7 g/dL — ABNORMAL LOW (ref 31.5–35.7)
MCV: 96 fL (ref 79–97)
Monocytes Absolute: 0.5 x10E3/uL (ref 0.1–0.9)
Monocytes: 9 %
NRBC: 4 % — ABNORMAL HIGH (ref 0–0)
Neutrophils Absolute: 2.5 x10E3/uL (ref 1.4–7.0)
Neutrophils: 49 %
Platelets: 542 x10E3/uL — ABNORMAL HIGH (ref 150–450)
Potassium: 4.5 mmol/L (ref 3.5–5.2)
RBC: 4.06 x10E6/uL — ABNORMAL LOW (ref 4.14–5.80)
RDW: 18.7 % — ABNORMAL HIGH (ref 11.6–15.4)
Retic Ct Pct: 5.7 % — ABNORMAL HIGH (ref 0.6–2.6)
Sodium: 138 mmol/L (ref 134–144)
Total Protein: 7.9 g/dL (ref 6.0–8.5)
Vit D, 25-Hydroxy: 48.6 ng/mL (ref 30.0–100.0)
WBC: 5.1 x10E3/uL (ref 3.4–10.8)
eGFR: 133 mL/min/1.73 (ref >59)

## 2023-09-01 ENCOUNTER — Ambulatory Visit: Payer: Self-pay | Admitting: Nurse Practitioner

## 2023-09-01 DIAGNOSIS — G894 Chronic pain syndrome: Secondary | ICD-10-CM

## 2023-09-01 DIAGNOSIS — D571 Sickle-cell disease without crisis: Secondary | ICD-10-CM

## 2023-09-02 ENCOUNTER — Other Ambulatory Visit (HOSPITAL_COMMUNITY): Payer: Self-pay

## 2023-09-02 ENCOUNTER — Other Ambulatory Visit: Payer: Self-pay | Admitting: Internal Medicine

## 2023-09-03 LAB — TOXASSURE FLEX 15, UR
6-ACETYLMORPHINE IA: NEGATIVE ng/mL
7-aminoclonazepam: NOT DETECTED ng/mg{creat}
AMPHETAMINES IA: NEGATIVE ng/mL
Alpha-hydroxyalprazolam: NOT DETECTED ng/mg{creat}
Alpha-hydroxymidazolam: NOT DETECTED ng/mg{creat}
Alpha-hydroxytriazolam: NOT DETECTED ng/mg{creat}
Alprazolam: NOT DETECTED ng/mg{creat}
BARBITURATES IA: NEGATIVE ng/mL
Buprenorphine: NOT DETECTED ng/mg{creat}
COCAINE METABOLITE IA: NEGATIVE ng/mL
Clonazepam: NOT DETECTED ng/mg{creat}
Creatinine: 120 mg/dL (ref >=20)
Desalkylflurazepam: NOT DETECTED ng/mg{creat}
Desmethyldiazepam: NOT DETECTED ng/mg{creat}
Desmethylflunitrazepam: NOT DETECTED ng/mg{creat}
Diazepam: NOT DETECTED ng/mg{creat}
ETHYL ALCOHOL Enzymatic: NEGATIVE g/dL
Fentanyl: NOT DETECTED ng/mg{creat}
Flunitrazepam: NOT DETECTED ng/mg{creat}
Lorazepam: NOT DETECTED ng/mg{creat}
METHADONE IA: NEGATIVE ng/mL
METHADONE MTB IA: NEGATIVE ng/mL
Midazolam: NOT DETECTED ng/mg{creat}
Norbuprenorphine: NOT DETECTED ng/mg{creat}
Norfentanyl: NOT DETECTED ng/mg{creat}
OPIATE CLASS IA: NEGATIVE ng/mL
OXYCODONE CLASS IA: NEGATIVE ng/mL
Oxazepam: NOT DETECTED ng/mg{creat}
PHENCYCLIDINE IA: NEGATIVE ng/mL
TAPENTADOL, IA: NEGATIVE ng/mL
TRAMADOL IA: NEGATIVE ng/mL
Temazepam: NOT DETECTED ng/mg{creat}

## 2023-09-03 LAB — CANNABINOIDS, MS, UR RFX
Cannabinoids Confirmation: POSITIVE
Carboxy-THC: 635 ng/mg{creat}

## 2023-09-04 ENCOUNTER — Other Ambulatory Visit (HOSPITAL_COMMUNITY): Payer: Self-pay

## 2023-09-04 ENCOUNTER — Other Ambulatory Visit: Payer: Self-pay

## 2023-09-04 NOTE — Addendum Note (Signed)
 Addended by: JUANICE THOMES SAUNDERS on: 09/04/2023 08:32 AM   Modules accepted: Orders

## 2023-09-05 ENCOUNTER — Ambulatory Visit: Payer: Self-pay | Admitting: Nurse Practitioner

## 2023-09-06 ENCOUNTER — Other Ambulatory Visit: Payer: Self-pay

## 2023-09-13 ENCOUNTER — Other Ambulatory Visit: Payer: Self-pay

## 2023-09-13 DIAGNOSIS — D571 Sickle-cell disease without crisis: Secondary | ICD-10-CM

## 2023-09-13 DIAGNOSIS — G894 Chronic pain syndrome: Secondary | ICD-10-CM

## 2023-09-14 ENCOUNTER — Other Ambulatory Visit: Payer: Self-pay | Admitting: Nurse Practitioner

## 2023-09-14 DIAGNOSIS — G894 Chronic pain syndrome: Secondary | ICD-10-CM

## 2023-09-14 DIAGNOSIS — D571 Sickle-cell disease without crisis: Secondary | ICD-10-CM

## 2023-09-14 NOTE — Telephone Encounter (Signed)
 Please advise [facility]

## 2023-09-15 ENCOUNTER — Other Ambulatory Visit (HOSPITAL_COMMUNITY): Payer: Self-pay

## 2023-09-15 MED ORDER — OXYCODONE-ACETAMINOPHEN 10-325 MG PO TABS
1.0000 | ORAL_TABLET | ORAL | 0 refills | Status: DC | PRN
  Filled 2023-09-19: qty 90, 15d supply, fill #0
  Filled ????-??-??: fill #0

## 2023-09-15 NOTE — Telephone Encounter (Signed)
 Reviewed PDMP substance reporting system prior to prescribing opiate medications. No inconsistencies noted.   1. Sickle cell disease without crisis (HCC)  - oxyCODONE-acetaminophen (PERCOCET) 10-325 MG tablet; Take 1 tablet by mouth every 4 (four) hours as needed for up to 15 days for pain.  Dispense: 90 tablet; Refill: 0  2. Chronic pain syndrome  - oxyCODONE-acetaminophen (PERCOCET) 10-325 MG tablet; Take 1 tablet by mouth every 4 (four) hours as needed for up to 15 days for pain.  Dispense: 90 tablet; Refill: 0

## 2023-09-17 LAB — TOXASSURE FLEX 15, UR
6-ACETYLMORPHINE IA: NEGATIVE ng/mL
7-aminoclonazepam: NOT DETECTED ng/mg{creat}
AMPHETAMINES IA: NEGATIVE ng/mL
Alpha-hydroxyalprazolam: NOT DETECTED ng/mg{creat}
Alpha-hydroxymidazolam: NOT DETECTED ng/mg{creat}
Alpha-hydroxytriazolam: NOT DETECTED ng/mg{creat}
Alprazolam: NOT DETECTED ng/mg{creat}
BARBITURATES IA: NEGATIVE ng/mL
Buprenorphine: NOT DETECTED ng/mg{creat}
COCAINE METABOLITE IA: NEGATIVE ng/mL
Clonazepam: NOT DETECTED ng/mg{creat}
Creatinine: 126 mg/dL (ref >=20)
Desalkylflurazepam: NOT DETECTED ng/mg{creat}
Desmethyldiazepam: NOT DETECTED ng/mg{creat}
Desmethylflunitrazepam: NOT DETECTED ng/mg{creat}
Diazepam: NOT DETECTED ng/mg{creat}
ETHYL ALCOHOL Enzymatic: NEGATIVE g/dL
Fentanyl: NOT DETECTED ng/mg{creat}
Flunitrazepam: NOT DETECTED ng/mg{creat}
Lorazepam: NOT DETECTED ng/mg{creat}
METHADONE IA: NEGATIVE ng/mL
METHADONE MTB IA: NEGATIVE ng/mL
Midazolam: NOT DETECTED ng/mg{creat}
Norbuprenorphine: NOT DETECTED ng/mg{creat}
Norfentanyl: NOT DETECTED ng/mg{creat}
OPIATE CLASS IA: NEGATIVE ng/mL
Oxazepam: NOT DETECTED ng/mg{creat}
PHENCYCLIDINE IA: NEGATIVE ng/mL
TAPENTADOL, IA: NEGATIVE ng/mL
TRAMADOL IA: NEGATIVE ng/mL
Temazepam: NOT DETECTED ng/mg{creat}

## 2023-09-17 LAB — CANNABINOIDS, MS, UR RFX
Cannabinoids Confirmation: POSITIVE
Carboxy-THC: >794 ng/mg{creat}

## 2023-09-17 LAB — OXYCODONE CLASS, MS, UR RFX
Noroxycodone: 853 ng/mg{creat}
Noroxymorphone: 244 ng/mg{creat}
Oxycodone Class Confirmation: POSITIVE
Oxycodone: 55 ng/mg{creat}
Oxymorphone: 813 ng/mg{creat}

## 2023-09-19 ENCOUNTER — Other Ambulatory Visit (HOSPITAL_COMMUNITY): Payer: Self-pay

## 2023-09-19 ENCOUNTER — Ambulatory Visit: Payer: Self-pay | Admitting: Nurse Practitioner

## 2023-09-24 ENCOUNTER — Emergency Department (HOSPITAL_COMMUNITY)

## 2023-09-24 ENCOUNTER — Inpatient Hospital Stay (HOSPITAL_COMMUNITY)
Admission: EM | Admit: 2023-09-24 | Discharge: 2023-09-26 | DRG: 812 | Discharge status: Against Medical Advice | Attending: Internal Medicine | Admitting: Internal Medicine

## 2023-09-24 ENCOUNTER — Encounter (HOSPITAL_COMMUNITY): Payer: Self-pay

## 2023-09-24 ENCOUNTER — Other Ambulatory Visit: Payer: Self-pay

## 2023-09-24 DIAGNOSIS — D638 Anemia in other chronic diseases classified elsewhere: Secondary | ICD-10-CM | POA: Diagnosis present

## 2023-09-24 DIAGNOSIS — R079 Chest pain, unspecified: Secondary | ICD-10-CM | POA: Diagnosis not present

## 2023-09-24 DIAGNOSIS — F419 Anxiety disorder, unspecified: Secondary | ICD-10-CM | POA: Diagnosis present

## 2023-09-24 DIAGNOSIS — Z9049 Acquired absence of other specified parts of digestive tract: Secondary | ICD-10-CM

## 2023-09-24 DIAGNOSIS — Z86718 Personal history of other venous thrombosis and embolism: Secondary | ICD-10-CM | POA: Diagnosis not present

## 2023-09-24 DIAGNOSIS — Z885 Allergy status to narcotic agent status: Secondary | ICD-10-CM

## 2023-09-24 DIAGNOSIS — R0789 Other chest pain: Secondary | ICD-10-CM | POA: Diagnosis not present

## 2023-09-24 DIAGNOSIS — Z87891 Personal history of nicotine dependence: Secondary | ICD-10-CM | POA: Diagnosis not present

## 2023-09-24 DIAGNOSIS — F32A Depression, unspecified: Secondary | ICD-10-CM | POA: Diagnosis present

## 2023-09-24 DIAGNOSIS — Z832 Family history of diseases of the blood and blood-forming organs and certain disorders involving the immune mechanism: Secondary | ICD-10-CM | POA: Diagnosis not present

## 2023-09-24 DIAGNOSIS — M545 Low back pain, unspecified: Secondary | ICD-10-CM | POA: Diagnosis present

## 2023-09-24 DIAGNOSIS — Z7901 Long term (current) use of anticoagulants: Secondary | ICD-10-CM | POA: Diagnosis not present

## 2023-09-24 DIAGNOSIS — Z5329 Procedure and treatment not carried out because of patient's decision for other reasons: Secondary | ICD-10-CM | POA: Diagnosis present

## 2023-09-24 DIAGNOSIS — Z79899 Other long term (current) drug therapy: Secondary | ICD-10-CM

## 2023-09-24 DIAGNOSIS — G894 Chronic pain syndrome: Secondary | ICD-10-CM | POA: Diagnosis present

## 2023-09-24 DIAGNOSIS — Z881 Allergy status to other antibiotic agents status: Secondary | ICD-10-CM | POA: Diagnosis not present

## 2023-09-24 DIAGNOSIS — D5700 Hb-SS disease with crisis, unspecified: Principal | ICD-10-CM | POA: Diagnosis present

## 2023-09-24 DIAGNOSIS — D72829 Elevated white blood cell count, unspecified: Secondary | ICD-10-CM | POA: Diagnosis present

## 2023-09-24 DIAGNOSIS — Z888 Allergy status to other drugs, medicaments and biological substances status: Secondary | ICD-10-CM

## 2023-09-24 LAB — CBC WITH DIFFERENTIAL/PLATELET
Abs Granulocyte: 6 K/uL (ref 1.5–6.5)
Abs Immature Granulocytes: 0.03 K/uL (ref 0.00–0.07)
Basophils Absolute: 0 K/uL (ref 0.0–0.1)
Basophils Relative: 0 %
Eosinophils Absolute: 0.3 K/uL (ref 0.0–0.5)
Eosinophils Relative: 3 %
HCT: 35.3 % — ABNORMAL LOW (ref 39.0–52.0)
Hemoglobin: 12.4 g/dL — ABNORMAL LOW (ref 13.0–17.0)
Immature Granulocytes: 0 %
Lymphocytes Relative: 24 %
Lymphs Abs: 2.2 K/uL (ref 0.7–4.0)
MCH: 29.3 pg (ref 26.0–34.0)
MCHC: 35.1 g/dL (ref 30.0–36.0)
MCV: 83.5 fL (ref 80.0–100.0)
Monocytes Absolute: 0.8 K/uL (ref 0.1–1.0)
Monocytes Relative: 9 %
Neutro Abs: 6 K/uL (ref 1.7–7.7)
Neutrophils Relative %: 64 %
Platelets: 430 K/uL — ABNORMAL HIGH (ref 150–400)
RBC: 4.23 MIL/uL (ref 4.22–5.81)
RDW: 20.2 % — ABNORMAL HIGH (ref 11.5–15.5)
Smear Review: NORMAL
WBC: 9.2 K/uL (ref 4.0–10.5)
nRBC: 1.1 % — ABNORMAL HIGH (ref 0.0–0.2)

## 2023-09-24 LAB — COMPREHENSIVE METABOLIC PANEL WITH GFR
ALT: 12 U/L (ref 0–44)
AST: 26 U/L (ref 15–41)
Albumin: 4.2 g/dL (ref 3.5–5.0)
Alkaline Phosphatase: 74 U/L (ref 38–126)
Anion gap: 10 (ref 5–15)
BUN: 6 mg/dL (ref 6–20)
CO2: 27 mmol/L (ref 22–32)
Calcium: 9.5 mg/dL (ref 8.9–10.3)
Chloride: 101 mmol/L (ref 98–111)
Creatinine, Ser: 0.71 mg/dL (ref 0.61–1.24)
GFR, Estimated: >60 mL/min (ref >60)
Glucose, Bld: 94 mg/dL (ref 70–99)
Potassium: 3.5 mmol/L (ref 3.5–5.1)
Sodium: 138 mmol/L (ref 135–145)
Total Bilirubin: 2.7 mg/dL — ABNORMAL HIGH (ref 0.0–1.2)
Total Protein: 8.5 g/dL — ABNORMAL HIGH (ref 6.5–8.1)

## 2023-09-24 LAB — RETICULOCYTES
Immature Retic Fract: 27.3 % — ABNORMAL HIGH (ref 2.3–15.9)
RBC.: 4.23 MIL/uL (ref 4.22–5.81)
Retic Count, Absolute: 271.1 K/uL — ABNORMAL HIGH (ref 19.0–186.0)
Retic Ct Pct: 6.4 % — ABNORMAL HIGH (ref 0.4–3.1)

## 2023-09-24 LAB — TROPONIN I (HIGH SENSITIVITY): Troponin I (High Sensitivity): 6 ng/L (ref <18)

## 2023-09-24 MED ORDER — HYDROMORPHONE HCL 1 MG/ML IJ SOLN
2.0000 mg | INTRAMUSCULAR | Status: AC
  Administered 2023-09-24: 2 mg via INTRAVENOUS
  Filled 2023-09-24: qty 2

## 2023-09-24 MED ORDER — HYDROXYUREA 500 MG PO CAPS
500.0000 mg | ORAL_CAPSULE | Freq: Two times a day (BID) | ORAL | Status: DC
  Administered 2023-09-24 – 2023-09-26 (×7): 500 mg via ORAL
  Filled 2023-09-24 (×4): qty 1

## 2023-09-24 MED ORDER — APIXABAN 5 MG PO TABS
5.0000 mg | ORAL_TABLET | Freq: Two times a day (BID) | ORAL | Status: DC
  Administered 2023-09-24 – 2023-09-26 (×7): 5 mg via ORAL
  Filled 2023-09-24 (×4): qty 1

## 2023-09-24 MED ORDER — SODIUM CHLORIDE 0.9% FLUSH: 9.0000 mL | INTRAVENOUS | Status: DC | PRN

## 2023-09-24 MED ORDER — LORATADINE 10 MG PO TABS
10.0000 mg | ORAL_TABLET | Freq: Every day | ORAL | Status: DC
  Administered 2023-09-25 – 2023-09-26 (×4): 10 mg via ORAL
  Filled 2023-09-24 (×2): qty 1

## 2023-09-24 MED ORDER — DIPHENHYDRAMINE HCL 25 MG PO CAPS
25.0000 mg | ORAL_CAPSULE | ORAL | Status: DC | PRN
  Administered 2023-09-25 (×2): 25 mg via ORAL
  Filled 2023-09-24: qty 1

## 2023-09-24 MED ORDER — SODIUM CHLORIDE 0.45 % IV SOLN: INTRAVENOUS | Status: AC

## 2023-09-24 MED ORDER — ONDANSETRON HCL 4 MG/2ML IJ SOLN
4.0000 mg | INTRAMUSCULAR | Status: DC | PRN
  Administered 2023-09-24 – 2023-09-25 (×8): 4 mg via INTRAVENOUS
  Filled 2023-09-24 (×5): qty 2

## 2023-09-24 MED ORDER — NALOXONE HCL 0.4 MG/ML IJ SOLN: 0.4000 mg | INTRAMUSCULAR | Status: DC | PRN

## 2023-09-24 MED ORDER — FOLIC ACID 1 MG PO TABS
1.0000 mg | ORAL_TABLET | Freq: Every day | ORAL | Status: DC
Start: 2023-09-25 — End: 2023-09-27
  Administered 2023-09-25 – 2023-09-26 (×4): 1 mg via ORAL
  Filled 2023-09-24 (×2): qty 1

## 2023-09-24 MED ORDER — OXYCODONE-ACETAMINOPHEN 5-325 MG PO TABS
1.0000 | ORAL_TABLET | ORAL | Status: DC | PRN
  Administered 2023-09-24 – 2023-09-25 (×3): 1 via ORAL
  Filled 2023-09-24 (×2): qty 1

## 2023-09-24 MED ORDER — POLYETHYLENE GLYCOL 3350 17 G PO PACK: 17.0000 g | PACK | Freq: Every day | ORAL | Status: DC | PRN

## 2023-09-24 MED ORDER — HYDROMORPHONE HCL 1 MG/ML IJ SOLN: 1.0000 mg | Freq: Once | INTRAMUSCULAR | Status: DC

## 2023-09-24 MED ORDER — HYDROMORPHONE HCL 1 MG/ML IJ SOLN
2.0000 mg | INTRAMUSCULAR | Status: DC | PRN
  Administered 2023-09-24: 2 mg via INTRAVENOUS
  Filled 2023-09-24 (×2): qty 2

## 2023-09-24 MED ORDER — SENNOSIDES-DOCUSATE SODIUM 8.6-50 MG PO TABS
1.0000 | ORAL_TABLET | Freq: Two times a day (BID) | ORAL | Status: DC
  Administered 2023-09-25 (×2): 1 via ORAL
  Filled 2023-09-24 (×4): qty 1

## 2023-09-24 MED ORDER — OXYCODONE HCL 5 MG PO TABS
5.0000 mg | ORAL_TABLET | ORAL | Status: DC | PRN
  Administered 2023-09-24 – 2023-09-25 (×3): 5 mg via ORAL
  Filled 2023-09-24 (×2): qty 1

## 2023-09-24 MED ORDER — HYDROMORPHONE 1 MG/ML IV SOLN
INTRAVENOUS | Status: DC
  Administered 2023-09-24: 30 mg via INTRAVENOUS
  Administered 2023-09-25: 4 mg via INTRAVENOUS
  Administered 2023-09-25: 8.5 mg via INTRAVENOUS
  Administered 2023-09-25: 4 mg via INTRAVENOUS
  Administered 2023-09-25: 8.5 mg via INTRAVENOUS
  Filled 2023-09-24: qty 30

## 2023-09-24 MED ORDER — KETOROLAC TROMETHAMINE 15 MG/ML IJ SOLN
15.0000 mg | Freq: Once | INTRAMUSCULAR | Status: AC
  Administered 2023-09-24: 15 mg via INTRAVENOUS
  Filled 2023-09-24: qty 1

## 2023-09-24 MED ORDER — KETOROLAC TROMETHAMINE 15 MG/ML IJ SOLN
15.0000 mg | Freq: Four times a day (QID) | INTRAMUSCULAR | Status: DC
  Administered 2023-09-24 – 2023-09-26 (×15): 15 mg via INTRAVENOUS
  Filled 2023-09-24 (×7): qty 1

## 2023-09-24 MED ORDER — SODIUM CHLORIDE 0.9 % IV SOLN
12.5000 mg | Freq: Once | INTRAVENOUS | Status: AC
  Administered 2023-09-24: 12.5 mg via INTRAVENOUS
  Filled 2023-09-24: qty 12.5

## 2023-09-24 MED ORDER — OXYCODONE-ACETAMINOPHEN 10-325 MG PO TABS: 1.0000 | ORAL_TABLET | ORAL | Status: DC | PRN

## 2023-09-24 NOTE — ED Provider Notes (Signed)
 [place] EMERGENCY DEPARTMENT AT [facility] Provider Note   CSN: [id number] Arrival date & time: 09/24/23  1234     Patient presents with: Sickle Cell Pain Crisis   Timothy Marks is a 33 y.o. male who presents to the emergency department with sickle cell crisis like pain.  Patient states that he has been compliant with his outpatient medications but is still having breakthrough pain.  He states that his severe pain started on Friday and has persisted.  He is specifically reporting pain in his bilateral lower extremities, chest, low back, as well as neck.  Patient states that he has been compliant with his Eliquis  outpatient.  Denies shortness of breath, abdominal pain, nausea, vomiting.  Patient states that last time he was admitted for sickle cell leg pain was 08/06/2023.  Past medical history significant for sickle cell anemia, osteonecrosis, hyperbilirubinemia, history of priapism, adjustment disorder, chronic pain syndrome, DVT, acute chest syndrome, major depressive disorder, marijuana use, chronic thromboembolic pulmonary hypertension, insomnia, anxiety, etc.    Sickle Cell Pain Crisis Associated symptoms: chest pain        Prior to Admission medications   Medication Sig Start Date End Date Taking? Authorizing Provider  apixaban  (ELIQUIS ) 5 MG TABS tablet Take 1 tablet (5 mg total) by mouth 2 (two) times daily. 08/30/23  Yes Paseda, Folashade R, FNP  cetirizine (ZYRTEC) 10 MG tablet Take 10 mg by mouth daily.   Yes [provider]  diphenhydrAMINE  (BENADRYL ) 25 mg capsule Take 1 capsule (25 mg total) by mouth every 6 (six) hours as needed for itching. 01/17/23  Yes Paseda, Folashade R, FNP  ergocalciferol  (VITAMIN D2) 1.25 MG (50000 UT) capsule Take 1 capsule (50,000 Units total) by mouth once a week. 05/16/23  Yes   folic acid  (FOLVITE ) 1 MG tablet Take 1 tablet (1 mg total) by mouth daily. 08/30/23  Yes Paseda, Folashade R, FNP  hydroxyurea  (HYDREA ) 500 MG  capsule Take 1 capsule (500 mg total) by mouth 2 (two) times daily. May take with food to minimize GI side effects. 06/19/23  Yes Paseda, Folashade R, FNP  ibuprofen  (ADVIL ) 800 MG tablet Take 1 tablet (800 mg total) by mouth every 8 (eight) hours as needed. 08/30/23  Yes Paseda, Folashade R, FNP  naloxone  (NARCAN ) nasal spray 4 mg/0.1 mL Place 1 spray into the nose once. 09/04/22  Yes [provider]  oxyCODONE -acetaminophen  (PERCOCET) 10-325 MG tablet Take 1 tablet by mouth every 4 (four) hours as needed for up to 15 days for pain. 09/19/23 10/04/23 Yes Paseda, Folashade R, FNP  gabapentin  (NEURONTIN ) 300 MG capsule Take 1 capsule (300 mg total) by mouth 3 (three) times daily. Patient not taking: Reported on 05/11/2023 05/03/23   Paseda, Folashade R, FNP    Allergies: Buprenorphine hcl, Levaquin  [levofloxacin ], Meperidine, Morphine , Morphine  and codeine, Tramadol, Vancomycin, and Zosyn [piperacillin sod-tazobactam so]    Review of Systems  Cardiovascular:  Positive for chest pain.  Musculoskeletal:  Positive for back pain.    Updated Vital Signs BP 126/75 (BP Location: Right Arm)   Pulse (!) 107   Temp 98.1 F (36.7 C) (Oral)   Resp 15   SpO2 95%   Physical Exam Vitals and nursing note reviewed.  Constitutional:      General: He is awake. He is not in acute distress.    Appearance: He is ill-appearing. He is not toxic-appearing or diaphoretic.  HENT:     Head: Normocephalic and atraumatic.  Eyes:  General: No scleral icterus. Cardiovascular:     Rate and Rhythm: Normal rate and regular rhythm.  Pulmonary:     Effort: Pulmonary effort is normal. No respiratory distress.     Breath sounds: Normal breath sounds. No wheezing, rhonchi or rales.  Musculoskeletal:        General: Normal range of motion.     Cervical back: Normal range of motion.     Right lower leg: No edema.     Left lower leg: No edema.  Skin:    General: Skin is warm.     Capillary Refill: Capillary  refill takes less than 2 seconds.  Neurological:     General: No focal deficit present.     Mental Status: He is alert and oriented to person, place, and time.  Psychiatric:        Mood and Affect: Mood normal.        Behavior: Behavior normal. Behavior is cooperative.     (all labs ordered are listed, but only abnormal results are displayed) Labs Reviewed  COMPREHENSIVE METABOLIC PANEL WITH GFR - Abnormal; Notable for the following components:      Result Value   Total Protein 8.5 (*)    Total Bilirubin 2.7 (*)    All other components within normal limits  CBC WITH DIFFERENTIAL/PLATELET - Abnormal; Notable for the following components:   Hemoglobin 12.4 (*)    HCT 35.3 (*)    RDW 20.2 (*)    Platelets 430 (*)    nRBC 1.1 (*)    All other components within normal limits  RETICULOCYTES - Abnormal; Notable for the following components:   Retic Ct Pct 6.4 (*)    Retic Count, Absolute 271.1 (*)    Immature Retic Fract 27.3 (*)    All other components within normal limits  HIV ANTIBODY (ROUTINE TESTING W REFLEX)  BASIC METABOLIC PANEL WITH GFR  CBC  TROPONIN I (HIGH SENSITIVITY)    EKG: None  Radiology: DG Chest 2 View Result Date: 09/24/2023 CLINICAL DATA:  Chest pain, sickle cell crisis. EXAM: CHEST - 2 VIEW COMPARISON:  09/05/2022 and CT chest 01/13/2022. FINDINGS: Trachea is midline. Heart size normal. Lungs are somewhat hyperinflated but clear. No pleural. IMPRESSION: Mild hyperinflation without acute finding. Electronically Signed   By: Newell Eke M.D.   On: 09/24/2023 14:38     Procedures   Medications Ordered in the ED  0.45 % sodium chloride  infusion ( Intravenous New Bag/Given 09/24/23 1418)  ondansetron  (ZOFRAN ) injection 4 mg (4 mg Intravenous Given 09/24/23 1415)  HYDROmorphone  (DILAUDID ) injection 2 mg (2 mg Intravenous Given 09/24/23 1907)  hydroxyurea  (HYDREA ) capsule 500 mg (has no administration in time range)  apixaban  (ELIQUIS ) tablet 5 mg (has no  administration in time range)  folic acid  (FOLVITE ) tablet 1 mg (has no administration in time range)  loratadine  (CLARITIN ) tablet 10 mg (has no administration in time range)  senna-docusate (Senokot-S) tablet 1 tablet (has no administration in time range)  polyethylene glycol (MIRALAX  / GLYCOLAX ) packet 17 g (has no administration in time range)  ketorolac  (TORADOL ) 15 MG/ML injection 15 mg (has no administration in time range)  oxyCODONE -acetaminophen  (PERCOCET/ROXICET) 5-325 MG per tablet 1 tablet (has no administration in time range)    And  oxyCODONE  (Oxy IR/ROXICODONE ) immediate release tablet 5 mg (has no administration in time range)  naloxone  (NARCAN ) injection 0.4 mg (has no administration in time range)    And  sodium chloride  flush (NS) 0.9 % injection 9  mL (has no administration in time range)  diphenhydrAMINE  (BENADRYL ) capsule 25 mg (has no administration in time range)  HYDROmorphone  (DILAUDID ) 1 mg/mL PCA injection (30 mg Intravenous Set-up / Initial Syringe 09/24/23 2044)  HYDROmorphone  (DILAUDID ) injection 2 mg (2 mg Intravenous Given 09/24/23 1415)  HYDROmorphone  (DILAUDID ) injection 2 mg (2 mg Intravenous Given 09/24/23 1513)  HYDROmorphone  (DILAUDID ) injection 2 mg (2 mg Intravenous Given 09/24/23 1618)  diphenhydrAMINE  (BENADRYL ) 12.5 mg in sodium chloride  0.9 % 50 mL IVPB (0 mg Intravenous Stopped 09/24/23 1546)  ketorolac  (TORADOL ) 15 MG/ML injection 15 mg (15 mg Intravenous Given 09/24/23 1619)                                    Medical Decision Making Amount and/or Complexity of Data Reviewed Labs: ordered. Radiology: ordered.  Risk Prescription drug management. Decision regarding hospitalization.   Patient presents to the ED for concern of sickle cell crisis like pain, chest pain, lower extremity pain, low back pain, neck pain, this involves an extensive number of treatment options, and is a complaint that carries with it a high risk of complications and  morbidity.  The differential diagnosis includes sickle cell crisis pain, acute chest syndrome, ACS, pulmonary embolism, musculoskeletal injury, etc.   Co morbidities that complicate the patient evaluation  sickle cell anemia, osteonecrosis, hyperbilirubinemia, history of priapism, adjustment disorder, chronic pain syndrome, DVT, acute chest syndrome, major depressive disorder, marijuana use, chronic thromboembolic pulmonary hypertension, insomnia, anxiety   Additional history obtained:  Chart briefly reviewed and looks like patient was last admitted for sickle cell crisis on 08/06/2023  Lab Tests:  I Ordered, and personally interpreted labs.  The pertinent results include: CBC shows hemoglobin of 12.4, CMP remarkable for total bilirubin of 2.7, troponin 6, reticulocyte count percentage 6.4, absolute reticulocyte count 271.1, immature reticulocyte fraction 27.3   Imaging Studies ordered:  I ordered imaging studies including chest x-ray I independently visualized and interpreted imaging which showed no evidence of pneumonia, no evidence of acute chest syndrome, mild hyperinflation I agree with the radiologist interpretation   Cardiac Monitoring:  The patient was maintained on a cardiac monitor.  I personally viewed and interpreted the cardiac monitored which showed an underlying rhythm of: Sinus rhythm EKG showed no significant ST segment elevation by my interpretation   Medicines ordered and prescription drug management:  I ordered medication including Zofran , Dilaudid , Benadryl , Toradol , fluids for acute sickle cell crisis Reevaluation of the patient after these medicines showed that the patient improved some I have reviewed the patients home medicines and have made adjustments as needed   Test Considered:  D-dimer, CTA: Declined at this time as patient vital signs are normal, denies shortness of breath, reports he has been compliant with outpatient eliquis  therapy   Critical  Interventions:  none   Consultations Obtained:  I requested consultation with the hospitalist team,  and discussed lab and imaging findings as well as pertinent plan - they recommend: admission for ongoing diagnosis and treatment   Problem List / ED Course:  33 year old male, sickle cell crisis like pain to the lower extremities, low back, neck, chest, past medical history significant for acute chest syndrome however states that this feels different, denies shortness of breath, compliant with outpatient Eliquis  therapy, vital signs stable, 98% on room air talking in full sentences comfortably Physical exam overall unremarkable, lungs and heart no obvious abnormality with auscultation Lab work ordered including  CBC, CMP, reticulocytes as well as troponin for chest pain Chest x-ray and EKG ordered Ordered multiple rounds of pain medication as well as Benadryl  and Zofran , after 3 rounds of pain medication patient feeling improved however states that he still feels he is in acute crisis and not ready to go home, states that he would like to be admitted Reached out to hospitalist team and informed to contact sickle cell team that is currently on-call today, consult requested, later informed that sickle cell team is not on-call currently, reached back out to hospitalist team this time to Dr. Lou who will see patient for admission Plan to admit patient for ongoing diagnosis and treatment of acute sickle cell crisis Most likely diagnosis at this time is acute sickle cell crisis pain, low clinical suspicion for PE or acute chest syndrome based off patient vitals, patient is also been compliant with outpatient Eliquis  therapy, low clinical suspicion for cardiac etiology as troponin was 6, EKG showed no STEMI by my interpretation    Reevaluation:  After the interventions noted above, I reevaluated the patient and found that they have :improved   Social Determinants of Health:  Sickle cell  patient   Dispostion:  After consideration of the diagnostic results and the patients response to treatment, I feel that the patient would benefit from admission to the hospital for ongoing diagnosis and treatment.      Final diagnoses:  Sickle cell pain crisis [facility])    ED Discharge Orders     None          Khole Arterburn F, PA-C 09/24/23 2105    Francesca Elsie CROME, MD 09/24/23 2118

## 2023-09-24 NOTE — ED Triage Notes (Signed)
 Pt c/o sickle cell pain in his lower back, chest and leg pain that started on Friday. Pt states he has been taking his meds as normal.

## 2023-09-24 NOTE — H&P (Addendum)
 History and Physical  Timothy Marks:[id number] DOB: 1990/09/02 DOA: 09/24/2023  PCP: Paseda, Folashade R, FNP   Chief Complaint: Sickle cell pain  HPI: Timothy Marks is a 33 y.o. male with medical history significant for sickle cell disease, DVT on Eliquis , anxiety and depression, avascular necrosis of the left shoulder, anemia of chronic disease, and chronic pain syndrome who presented to the ED for evaluation of sickle cell pain. Patient states he has had low back pain, chest pain and leg pain since Friday.  He has taken his home pain medications without significant relief.  Due to worsening of his typical sickle cell pain, he presented to the ED for IV pain control.  He denies any fevers, chills, nausea, vomiting, abdominal pain, shortness of breath, or cough.  ED Course: Initial vitals show afebrile and normotensive. Initial labs significant for Hgb 12.4, platelet 430, WBC 9.2, bilirubin 2.7 otherwise unremarkable CMP. CXR shows no active disease. Pt received IV Dilaudid  2 mg x 2, IV Toradol  50 mg x 1, IV Benadryl  12 mg x 1 and started on IV NS infusion. [facility] was consulted for admission.   Review of Systems: Please see HPI for pertinent positives and negatives. A complete 10 system review of systems are otherwise negative.  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 His smoking use included cigarettes. He has been exposed to tobacco smoke. He has never used smokeless tobacco. He reports current drug use. Frequency: 7.00 times per week. Drug: Marijuana. He reports that he does not drink  alcohol .  Allergies  Allergen Reactions   Buprenorphine Hcl Hives   Levaquin  [Levofloxacin ] Itching   Meperidine Rash   Morphine  Hives    Takes MS IR at home   Morphine  And Codeine Hives    Takes MSIR PTA   Tramadol Hives   Vancomycin Itching   Zosyn [Piperacillin Sod-Tazobactam So] Itching, Rash and Other (See Comments)    Has taken rocephin  in the past    Family History  Problem Relation Age of Onset   Sickle cell trait Mother    Sickle cell trait Father      Prior to Admission medications   Medication Sig Start Date End Date Taking? Authorizing Provider  apixaban  (ELIQUIS ) 5 MG TABS tablet Take 1 tablet (5 mg total) by mouth 2 (two) times daily. 08/30/23  Yes Paseda, Folashade R, FNP  cetirizine (ZYRTEC) 10 MG tablet Take 10 mg by mouth daily.   Yes [provider]  diphenhydrAMINE  (BENADRYL ) 25 mg capsule Take 1 capsule (25 mg total) by mouth every 6 (six) hours as needed for itching. 01/17/23  Yes Paseda, Folashade R, FNP  ergocalciferol  (VITAMIN D2) 1.25 MG (50000 UT) capsule Take 1 capsule (50,000 Units total) by mouth once a week. 05/16/23  Yes   folic acid  (FOLVITE ) 1 MG tablet Take 1 tablet (1 mg total) by mouth daily. 08/30/23  Yes Paseda, Folashade R, FNP  hydroxyurea  (HYDREA ) 500 MG capsule Take 1 capsule (500 mg total) by mouth 2 (two) times daily. May take with food to minimize GI side effects. 06/19/23  Yes Paseda, Folashade R, FNP  ibuprofen  (ADVIL ) 800 MG tablet Take 1 tablet (800 mg total) by mouth every 8 (eight) hours as needed. 08/30/23  Yes Paseda, Folashade R, FNP  naloxone  (NARCAN ) nasal spray 4 mg/0.1 mL Place 1 spray into the nose once. 09/04/22  Yes [provider]  oxyCODONE -acetaminophen  (PERCOCET) 10-325 MG tablet Take 1 tablet by mouth every 4 (four) hours as needed for up to 15 days for pain. 09/19/23 10/04/23 Yes Paseda, Folashade R, FNP  gabapentin  (NEURONTIN ) 300 MG capsule Take 1 capsule (300 mg total) by mouth 3 (three) times  daily. Patient not taking: Reported on 05/11/2023 05/03/23   Paseda, Folashade R, FNP    Physical Exam: BP (!) 131/92   Pulse (!) 54   Temp 97.8 F (36.6 C) (Oral)   Resp 18   SpO2 98%  General: Pleasant, well-appearing young man laying in bed talking on the phone. No acute distress. HEENT: Nord[place]/AT. Anicteric sclera Chest wall: Moderate tenderness to palpation of the chest CV: RRR. No murmurs, rubs, or gallops. No LE edema Pulmonary: Lungs CTAB. Normal effort. No wheezing or rales. Abdominal: Soft, nontender, nondistended. Normal bowel sounds. MSK: Mild tenderness to palpation of the L spine and lower legs Skin: Warm and dry. No obvious rash or lesions. Neuro: A&Ox3. Moves all extremities. Normal sensation to light touch. No focal deficit. Psych: Normal mood and affect          Labs on Admission:  Basic Metabolic Panel: Recent Labs  Lab 09/24/23 1405  NA 138  K 3.5  CL 101  CO2 27  GLUCOSE 94  BUN 6  CREATININE 0.71  CALCIUM 9.5   Liver Function Tests: Recent Labs  Lab 09/24/23 1405  AST 26  ALT 12  ALKPHOS 74  BILITOT 2.7*  PROT 8.5*  ALBUMIN 4.2   No results for input(s): LIPASE, AMYLASE in the last 168 hours. No results for input(s): AMMONIA in the last 168 hours. CBC: Recent Labs  Lab 09/24/23 1405  WBC 9.2  NEUTROABS 6.0  HGB 12.4*  HCT 35.3*  MCV 83.5  PLT 430*   Cardiac Enzymes: No results for input(s): CKTOTAL, CKMB, CKMBINDEX, TROPONINI in the last 168 hours. BNP (last 3 results) No results for input(s): BNP in the last 8760 hours.  ProBNP (last 3 results) No results for input(s): PROBNP in the last 8760 hours.  CBG: No results for input(s): GLUCAP in the last 168 hours.  Radiological Exams on Admission: DG Chest 2 View Result Date: 09/24/2023 CLINICAL DATA:  Chest pain, sickle cell crisis. EXAM: CHEST - 2 VIEW COMPARISON:  09/05/2022 and CT chest 01/13/2022. FINDINGS: Trachea is midline. Heart size normal. Lungs are  somewhat hyperinflated but clear. No pleural. IMPRESSION: Mild hyperinflation without acute finding. Electronically Signed   By: Newell Eke M.D.   On: 09/24/2023 14:38   Assessment/Plan [name] is a 33 y.o. male with medical history significant for sickle cell disease, depression with anxiety, anemia of chronic disease, and chronic pain syndrome who presented to the ED for evaluation of sickle cell pain and admitted for sickle cell pain crisis  # Sickle cell pain crisis - Presented with chest pain, low back and leg pain - Chest imaging negative for acute chest - Patient hemodynamically stable - Observation admission for pain control - Start IV Dilaudid  2 mg Q2H PRN for pain while in the ED - Start weight-based PCA pump on the floor - IV Toradol  15 mg Q6H for 5 days -  Benadryl  25 mg PRN for itching - IV NS at 40 cc/h for 1 day - Continue supplemental O2 - Continue home hydroxyurea  and folic acid   # Chronic pain syndrome - Continue home as needed Percocet  # DVT - Lower lower extremity swelling - Continue Eliquis   # Allergies - Continue loratadine    DVT prophylaxis: Lovenox      Code Status: Full Code  Consults called: None  Family Communication: No family at bedside  Severity of Illness: The appropriate patient status for this patient is OBSERVATION. Observation status is judged to be reasonable and necessary in order to provide the required intensity of service to ensure the patient's safety. The patient's presenting symptoms, physical exam findings, and initial radiographic and laboratory data in the context of their medical condition is felt to place them at decreased risk for further clinical deterioration. Furthermore, it is anticipated that the patient will be medically stable for discharge from the hospital within 2 midnights of admission.   Level of care: Telemetry   This record has been created using Conservation officer, historic buildings. Errors have been  sought and corrected, but may not always be located. Such creation errors do not reflect on the standard of care.   Lou Claretta HERO, MD 09/24/2023, 7:29 PM [facility] Pager: [phone] Isaiah 41:10   If 7PM-7AM, please contact night-coverage [email or web address] Password [email or web address]

## 2023-09-25 DIAGNOSIS — G894 Chronic pain syndrome: Secondary | ICD-10-CM | POA: Diagnosis present

## 2023-09-25 DIAGNOSIS — Z86718 Personal history of other venous thrombosis and embolism: Secondary | ICD-10-CM | POA: Diagnosis not present

## 2023-09-25 DIAGNOSIS — Z7901 Long term (current) use of anticoagulants: Secondary | ICD-10-CM | POA: Diagnosis not present

## 2023-09-25 DIAGNOSIS — Z79899 Other long term (current) drug therapy: Secondary | ICD-10-CM | POA: Diagnosis not present

## 2023-09-25 DIAGNOSIS — Z87891 Personal history of nicotine dependence: Secondary | ICD-10-CM | POA: Diagnosis not present

## 2023-09-25 DIAGNOSIS — Z881 Allergy status to other antibiotic agents status: Secondary | ICD-10-CM | POA: Diagnosis not present

## 2023-09-25 DIAGNOSIS — D72829 Elevated white blood cell count, unspecified: Secondary | ICD-10-CM | POA: Diagnosis present

## 2023-09-25 DIAGNOSIS — F419 Anxiety disorder, unspecified: Secondary | ICD-10-CM | POA: Diagnosis present

## 2023-09-25 DIAGNOSIS — D5700 Hb-SS disease with crisis, unspecified: Secondary | ICD-10-CM | POA: Diagnosis present

## 2023-09-25 DIAGNOSIS — Z885 Allergy status to narcotic agent status: Secondary | ICD-10-CM | POA: Diagnosis not present

## 2023-09-25 DIAGNOSIS — Z5329 Procedure and treatment not carried out because of patient's decision for other reasons: Secondary | ICD-10-CM | POA: Diagnosis present

## 2023-09-25 DIAGNOSIS — Z832 Family history of diseases of the blood and blood-forming organs and certain disorders involving the immune mechanism: Secondary | ICD-10-CM | POA: Diagnosis not present

## 2023-09-25 DIAGNOSIS — Z9049 Acquired absence of other specified parts of digestive tract: Secondary | ICD-10-CM | POA: Diagnosis not present

## 2023-09-25 DIAGNOSIS — M545 Low back pain, unspecified: Secondary | ICD-10-CM | POA: Diagnosis present

## 2023-09-25 DIAGNOSIS — F32A Depression, unspecified: Secondary | ICD-10-CM | POA: Diagnosis present

## 2023-09-25 DIAGNOSIS — Z888 Allergy status to other drugs, medicaments and biological substances status: Secondary | ICD-10-CM | POA: Diagnosis not present

## 2023-09-25 LAB — CBC
HCT: 29.7 % — ABNORMAL LOW (ref 39.0–52.0)
Hemoglobin: 10.5 g/dL — ABNORMAL LOW (ref 13.0–17.0)
MCH: 29.7 pg (ref 26.0–34.0)
MCHC: 35.4 g/dL (ref 30.0–36.0)
MCV: 83.9 fL (ref 80.0–100.0)
Platelets: 333 K/uL (ref 150–400)
RBC: 3.54 MIL/uL — ABNORMAL LOW (ref 4.22–5.81)
RDW: 21.4 % — ABNORMAL HIGH (ref 11.5–15.5)
WBC: 15.6 K/uL — ABNORMAL HIGH (ref 4.0–10.5)
nRBC: 0.6 % — ABNORMAL HIGH (ref 0.0–0.2)

## 2023-09-25 LAB — BASIC METABOLIC PANEL WITH GFR
Anion gap: 11 (ref 5–15)
BUN: 10 mg/dL (ref 6–20)
CO2: 26 mmol/L (ref 22–32)
Calcium: 9 mg/dL (ref 8.9–10.3)
Chloride: 101 mmol/L (ref 98–111)
Creatinine, Ser: 0.56 mg/dL — ABNORMAL LOW (ref 0.61–1.24)
GFR, Estimated: >60 mL/min (ref >60)
Glucose, Bld: 92 mg/dL (ref 70–99)
Potassium: 3.7 mmol/L (ref 3.5–5.1)
Sodium: 138 mmol/L (ref 135–145)

## 2023-09-25 LAB — HIV ANTIBODY (ROUTINE TESTING W REFLEX): HIV Screen 4th Generation wRfx: NONREACTIVE

## 2023-09-25 MED ORDER — PROCHLORPERAZINE EDISYLATE 10 MG/2ML IJ SOLN
5.0000 mg | Freq: Once | INTRAMUSCULAR | Status: AC
  Administered 2023-09-25 (×2): 5 mg via INTRAVENOUS
  Filled 2023-09-25: qty 2

## 2023-09-25 MED ORDER — HYDROMORPHONE 1 MG/ML IV SOLN
INTRAVENOUS | Status: DC
  Administered 2023-09-25: 30 mg via INTRAVENOUS
  Administered 2023-09-25: 1 mg via INTRAVENOUS
  Administered 2023-09-25: 7.5 mg via INTRAVENOUS
  Administered 2023-09-25: 30 mg via INTRAVENOUS
  Administered 2023-09-25: 1 mg via INTRAVENOUS
  Administered 2023-09-25: 7.5 mg via INTRAVENOUS
  Administered 2023-09-26: 6 mg via INTRAVENOUS
  Administered 2023-09-26: 2 mg via INTRAVENOUS
  Administered 2023-09-26: 6 mg via INTRAVENOUS
  Administered 2023-09-26: 2 mg via INTRAVENOUS
  Administered 2023-09-26: 3 mg via INTRAVENOUS
  Administered 2023-09-26: 3.25 mg via INTRAVENOUS
  Administered 2023-09-26 (×2): 3.5 mg via INTRAVENOUS
  Administered 2023-09-26: 3 mg via INTRAVENOUS
  Administered 2023-09-26: 3.25 mg via INTRAVENOUS
  Filled 2023-09-25 (×2): qty 30

## 2023-09-25 MED ORDER — OXYCODONE HCL 5 MG PO TABS
5.0000 mg | ORAL_TABLET | ORAL | Status: DC | PRN
  Administered 2023-09-25 – 2023-09-26 (×14): 5 mg via ORAL
  Filled 2023-09-25 (×7): qty 1

## 2023-09-25 MED ORDER — SODIUM CHLORIDE 0.45 % IV SOLN
INTRAVENOUS | Status: AC
Start: 2023-09-25 — End: 2023-09-26

## 2023-09-25 NOTE — Plan of Care (Signed)

## 2023-09-25 NOTE — Progress Notes (Addendum)
 Patient ID: Timothy Marks, male   DOB: 1990/09/29, 33 y.o.   MRN: [id number] Subjective: Timothy Marks is a 33 y.o. male with medical history significant for sickle cell disease, DVT on Eliquis , anxiety and depression, avascular necrosis of the left shoulder, anemia of chronic disease, and chronic pain syndrome who presented to the ED for evaluation of sickle cell pain. Patient states he has had low back pain, chest pain and leg pain since Friday, his home pain medications did not provide any significant relief.   Today he endorses pain of 8/10.  No new concerns.  He denies nausea, vomiting, diarrhea, headache, cough, chest pain.  No urinary symptoms.  Objective:  Vital signs in last 24 hours:  Vitals:   09/26/23 0651 09/26/23 0751 09/26/23 0812 09/26/23 1000  BP: 120/71  125/71 112/69  Pulse: (!) 108  (!) 112 89  Resp: 18 13  16   Temp: 99.4 F (37.4 C)   97.7 F (36.5 C)  TempSrc: Oral   Oral  SpO2: 92%  90% 94%  Weight:      Height:        Intake/Output from previous day:   Intake/Output Summary (Last 24 hours) at 09/26/2023 1043 Last data filed at 09/25/2023 1600 Gross per 24 hour  Intake 769.39 ml  Output 175 ml  Net 594.39 ml    Physical Exam: General: Alert, awake, oriented x3, in no acute distress.  HEENT: [place]/AT PEERL, EOMI Neck: Trachea midline,  no masses, no thyromegal,y no JVD, no carotid bruit OROPHARYNX:  Moist, No exudate/ erythema/lesions.  Heart: Regular rate and rhythm, without murmurs, rubs, gallops, PMI non-displaced, no heaves or thrills on palpation.  Lungs: Clear to auscultation, no wheezing or rhonchi noted. No increased vocal fremitus resonant to percussion  Abdomen: Soft, nontender, nondistended, positive bowel sounds, no masses no hepatosplenomegaly noted..  Neuro: No focal neurological deficits noted cranial nerves II through XII grossly intact. DTRs 2+ bilaterally upper and lower extremities. Strength 5 out of 5 in bilateral upper and lower  extremities. Musculoskeletal: Lower back/bilateral lower extremity tenderness.   Psychiatric: Patient alert and oriented x3, good insight and cognition, good recent to remote recall. Lymph node survey: No cervical axillary or inguinal lymphadenopathy noted.  Lab Results:  Basic Metabolic Panel:    Component Value Date/Time   NA 138 09/25/2023 0551   NA 138 08/30/2023 0946   K 3.7 09/25/2023 0551   CL 101 09/25/2023 0551   CO2 26 09/25/2023 0551   BUN 10 09/25/2023 0551   BUN 5 (L) 08/30/2023 0946   CREATININE 0.56 (L) 09/25/2023 0551   GLUCOSE 92 09/25/2023 0551   CALCIUM 9.0 09/25/2023 0551   CBC:    Component Value Date/Time   WBC 15.6 (H) 09/25/2023 0551   HGB 10.5 (L) 09/25/2023 0551   HGB 12.0 (L) 08/30/2023 0946   HCT 29.7 (L) 09/25/2023 0551   HCT 39.1 08/30/2023 0946   PLT 333 09/25/2023 0551   PLT 542 (H) 08/30/2023 0946   MCV 83.9 09/25/2023 0551   MCV 96 08/30/2023 0946   NEUTROABS 6.0 09/24/2023 1405   NEUTROABS 2.5 08/30/2023 0946   LYMPHSABS 2.2 09/24/2023 1405   LYMPHSABS 2.0 08/30/2023 0946   MONOABS 0.8 09/24/2023 1405   EOSABS 0.3 09/24/2023 1405   EOSABS 0.1 08/30/2023 0946   BASOSABS 0.0 09/24/2023 1405   BASOSABS 0.0 08/30/2023 0946    No results found for this or any previous visit (from the past 240 hours).  Studies/Results: DG  Chest 2 View Result Date: 09/24/2023 CLINICAL DATA:  Chest pain, sickle cell crisis. EXAM: CHEST - 2 VIEW COMPARISON:  09/05/2022 and CT chest 01/13/2022. FINDINGS: Trachea is midline. Heart size normal. Lungs are somewhat hyperinflated but clear. No pleural. IMPRESSION: Mild hyperinflation without acute finding. Electronically Signed   By: Newell Eke M.D.   On: 09/24/2023 14:38    Medications: Scheduled Meds:  apixaban   5 mg Oral BID   folic acid   1 mg Oral Daily   HYDROmorphone    Intravenous Q4H   hydroxyurea   500 mg Oral BID   ketorolac   15 mg Intravenous Q6H   loratadine   10 mg Oral Daily    senna-docusate  1 tablet Oral BID   Continuous Infusions:  sodium chloride  100 mL/hr at 09/26/23 0154   PRN Meds:.diphenhydrAMINE , naloxone  **AND** sodium chloride  flush, ondansetron , oxyCODONE , polyethylene glycol  Consultants: None  Procedures: None  Antibiotics: None  Assessment/Plan: Principal Problem:   Sickle cell pain crisis (HCC) Active Problems:   Sickle cell anemia with pain (HCC)   Leukocytosis   Chronic pain syndrome   Anemia of chronic disease   Hb Sickle Cell Disease with Pain crisis: Pain is not improving, continue IVF 0.45% Saline 100 ml/hr, weight based Dilaudid  PCA dose increased from 2 mg to 3 mg/hr,  IV Toradol  15 mg Q 6 H for a total of 5 days, continue oral home pain medications as ordered. Monitor vitals very closely, Re-evaluate pain scale regularly, 2 L of Oxygen  by [place]. Patient encouraged to ambulate on the hallway today.  Leukocytosis: Elevated most likely due to vaso-occlusive crisis.  Patient has no signs or symptoms of acute infection.  Will continue to monitor daily CBC. Anemia of Chronic Disease: Hemoglobin slightly lower than baseline.  No medical indication for transfusion at this time.  Will continue to monitor daily CBC. Chronic pain Syndrome: Continue oral home pain medication.   Code Status: Full Code Family Communication: N/A Disposition Plan: Not yet ready for discharge  Homer CHRISTELLA Cover NP  If 7PM-7AM, please contact night-coverage.  09/26/2023, 10:43 AM  LOS: 1 day

## 2023-09-25 NOTE — Progress Notes (Signed)
   09/25/23 1006  TOC Brief Assessment  Insurance and Status Reviewed  Patient has primary care physician Yes  Home environment has been reviewed home alone  Prior level of function: independent  Prior/Current Home Services No current home services  Social Drivers of Health Review SDOH reviewed no interventions necessary  Readmission risk has been reviewed Yes  Transition of care needs no transition of care needs at this time

## 2023-09-26 MED ORDER — ACETAMINOPHEN 500 MG PO TABS: 1000.0000 mg | ORAL_TABLET | Freq: Four times a day (QID) | ORAL | Status: DC | PRN

## 2023-09-26 MED ORDER — SODIUM CHLORIDE 0.45 % IV SOLN: INTRAVENOUS | Status: DC

## 2023-09-26 MED ORDER — HALOPERIDOL LACTATE 5 MG/ML IJ SOLN
2.0000 mg | Freq: Four times a day (QID) | INTRAMUSCULAR | Status: DC | PRN
  Administered 2023-09-26 (×2): 2 mg via INTRAVENOUS
  Filled 2023-09-26: qty 1

## 2023-09-26 MED ORDER — ACETAMINOPHEN 500 MG PO TABS
1000.0000 mg | ORAL_TABLET | Freq: Once | ORAL | Status: AC
  Administered 2023-09-26 (×2): 1000 mg via ORAL
  Filled 2023-09-26: qty 2

## 2023-09-26 NOTE — Progress Notes (Addendum)
 All during the night and during the day patient was unable to be compliant with safety measures for the PCA pump. Day RN notified NP and PCA was placed on hold d/t noncompliance. Patient c/o 9/10 pain after PO oxy and tylenol  given at 1830. Notified J. Blondie, NP and Dr. Jegede. They both stated to give haldol  IV PRN and restart fluids and Diluadid PCA. Brought everything in room to set it up. I gave haldol  before setting everything up. As soon as I set everything up and placed all safety measures in place. He immediately got very agitated and said he wanted to go home. Charge RN present. IV removed. Patient signed AMA paperwork. Notified J. Blondie, NP and Dr. Jegede. Patient called friend to come and drive him home. Brought new Diluadid PCA syringe to waste with another [employer] fundraiser. Before patient left AMA I was able to share with him that this is the standard treatment for sickle cell crisis here at [facility] and tried to get him to stay but he was very agitated. He stated he was just ready to go home.

## 2023-09-26 NOTE — Progress Notes (Signed)
 Pt non compliant with CO2 monitoring. Notified primary team. Okay to stop PCA pump. Continue with scheduled and prn pain medication. Pt educated about change in plan of care. Pt alert and oriented x4, verbalized understanding with teach back. See new orders

## 2023-09-26 NOTE — Progress Notes (Addendum)
 Patient ID: Timothy Marks, male   DOB: 07-10-1990, 33 y.o.   MRN: [id number] Subjective: Timothy Marks is a 33 y.o. male with medical history significant for sickle cell disease, DVT on Eliquis , anxiety and depression, avascular necrosis of the left shoulder, anemia of chronic disease, and chronic pain syndrome who presented to the ED for evaluation of sickle cell pain. Patient states he has had low back pain, chest pain and leg pain since Friday, his home pain medications did not provide any significant relief.   Patient endorses pain of 7/10.  No new concerns.  He denies nausea, vomiting, diarrhea, headache, cough, chest pain.  No urinary symptoms.  Objective:  Vital signs in last 24 hours:  Vitals:   09/26/23 0651 09/26/23 0751 09/26/23 0812 09/26/23 1000  BP: 120/71  125/71 112/69  Pulse: (!) 108  (!) 112 89  Resp: 18 13  16   Temp: 99.4 F (37.4 C)   97.7 F (36.5 C)  TempSrc: Oral   Oral  SpO2: 92%  90% 94%  Weight:      Height:        Intake/Output from previous day:   Intake/Output Summary (Last 24 hours) at 09/26/2023 1044 Last data filed at 09/25/2023 1600 Gross per 24 hour  Intake 769.39 ml  Output 175 ml  Net 594.39 ml    Physical Exam: General: Alert, awake, oriented x3, in no acute distress.  HEENT: [place]/AT PEERL, EOMI Neck: Trachea midline,  no masses, no thyromegal,y no JVD, no carotid bruit OROPHARYNX:  Moist, No exudate/ erythema/lesions.  Heart: Regular rate and rhythm, without murmurs, rubs, gallops, PMI non-displaced, no heaves or thrills on palpation.  Lungs: Clear to auscultation, no wheezing or rhonchi noted. No increased vocal fremitus resonant to percussion  Abdomen: Soft, nontender, nondistended, positive bowel sounds, no masses no hepatosplenomegaly noted..  Neuro: No focal neurological deficits noted cranial nerves II through XII grossly intact. DTRs 2+ bilaterally upper and lower extremities. Strength 5 out of 5 in bilateral upper and lower  extremities. Musculoskeletal: Lower back/bilateral lower extremity tenderness.   Psychiatric: Patient alert and oriented x3, good insight and cognition, good recent to remote recall. Lymph node survey: No cervical axillary or inguinal lymphadenopathy noted.  Lab Results:  Basic Metabolic Panel:    Component Value Date/Time   NA 138 09/25/2023 0551   NA 138 08/30/2023 0946   K 3.7 09/25/2023 0551   CL 101 09/25/2023 0551   CO2 26 09/25/2023 0551   BUN 10 09/25/2023 0551   BUN 5 (L) 08/30/2023 0946   CREATININE 0.56 (L) 09/25/2023 0551   GLUCOSE 92 09/25/2023 0551   CALCIUM 9.0 09/25/2023 0551   CBC:    Component Value Date/Time   WBC 15.6 (H) 09/25/2023 0551   HGB 10.5 (L) 09/25/2023 0551   HGB 12.0 (L) 08/30/2023 0946   HCT 29.7 (L) 09/25/2023 0551   HCT 39.1 08/30/2023 0946   PLT 333 09/25/2023 0551   PLT 542 (H) 08/30/2023 0946   MCV 83.9 09/25/2023 0551   MCV 96 08/30/2023 0946   NEUTROABS 6.0 09/24/2023 1405   NEUTROABS 2.5 08/30/2023 0946   LYMPHSABS 2.2 09/24/2023 1405   LYMPHSABS 2.0 08/30/2023 0946   MONOABS 0.8 09/24/2023 1405   EOSABS 0.3 09/24/2023 1405   EOSABS 0.1 08/30/2023 0946   BASOSABS 0.0 09/24/2023 1405   BASOSABS 0.0 08/30/2023 0946    No results found for this or any previous visit (from the past 240 hours).  Studies/Results: DG Chest  2 View Result Date: 09/24/2023 CLINICAL DATA:  Chest pain, sickle cell crisis. EXAM: CHEST - 2 VIEW COMPARISON:  09/05/2022 and CT chest 01/13/2022. FINDINGS: Trachea is midline. Heart size normal. Lungs are somewhat hyperinflated but clear. No pleural. IMPRESSION: Mild hyperinflation without acute finding. Electronically Signed   By: Newell Eke M.D.   On: 09/24/2023 14:38    Medications: Scheduled Meds:  apixaban   5 mg Oral BID   folic acid   1 mg Oral Daily   HYDROmorphone    Intravenous Q4H   hydroxyurea   500 mg Oral BID   ketorolac   15 mg Intravenous Q6H   loratadine   10 mg Oral Daily    senna-docusate  1 tablet Oral BID   Continuous Infusions:  sodium chloride  100 mL/hr at 09/26/23 0154   PRN Meds:.diphenhydrAMINE , naloxone  **AND** sodium chloride  flush, ondansetron , oxyCODONE , polyethylene glycol  Consultants: None  Procedures: None  Antibiotics: None  Assessment/Plan: Principal Problem:   Sickle cell pain crisis (HCC) Active Problems:   Sickle cell anemia with pain (HCC)   Leukocytosis   Chronic pain syndrome   Anemia of chronic disease   Hb Sickle Cell Disease with Pain crisis: continue IVF 0.45% Saline at 100 ml/hr, continue weight based Dilaudid  PCA,  IV Toradol  15 mg Q 6 H for a total of 5 days, continue oral home pain medications as ordered. Monitor vitals very closely, Re-evaluate pain scale regularly, 2 L of Oxygen  by [place]. Patient encouraged to ambulate on the hallway today.  Leukocytosis: Elevated most likely due to vaso-occlusive crisis.  Patient has no signs or symptoms of acute infection.  Will continue to monitor daily CBC. Anemia of Chronic Disease: Hemoglobin slightly lower than baseline.  No medical indication for transfusion at this time.  Will continue to monitor daily CBC. Chronic pain Syndrome: Continue oral home pain medication.   Code Status: Full Code Family Communication: N/A Disposition Plan: Not yet ready for discharge  Timothy Marks Cover NP  If 7PM-7AM, please contact night-coverage.  09/26/2023, 10;44 AM  LOS: 1 day

## 2023-09-26 NOTE — Progress Notes (Addendum)
 Pt noncompliant with CO2 monitoring. Per pt report he takes it off in his sleep and does not realize  because he sleeps hard. Pt also stated it was uncomfortable. Educated pt on CO2 monitoring with PCA pump. Pt educated to keep nasal CO2 monitoring on during medication administration. Pt verbalized understanding.

## 2023-09-26 NOTE — Progress Notes (Signed)
                                                  Against Medical Advice Patient at this time expresses desire to leave the Hospital immediately, patient has been warned that this is not Medically advisable at this time, and can result in Medical complications like Death and Disability, patient understands and accepts the risks involved and assumes full responsibilty of this decision.  This patient has also been advised that if they feel the need for further medical assistance to return to any available ER or dial [phone].  Informed by Nursing staff that this patient has left care and has signed the form  Against Medical Advice on 09/26/2023 at 2042 Hrs.  Lynwood Kipper BSN MSNA MSN ACNPC-AG Acute Care Nurse Practitioner [facility]

## 2023-09-28 ENCOUNTER — Other Ambulatory Visit: Payer: Self-pay

## 2023-09-28 ENCOUNTER — Other Ambulatory Visit: Payer: Self-pay | Admitting: Nurse Practitioner

## 2023-09-28 DIAGNOSIS — G894 Chronic pain syndrome: Secondary | ICD-10-CM

## 2023-09-28 DIAGNOSIS — D571 Sickle-cell disease without crisis: Secondary | ICD-10-CM

## 2023-09-29 ENCOUNTER — Other Ambulatory Visit (HOSPITAL_COMMUNITY): Payer: Self-pay

## 2023-09-29 MED ORDER — OXYCODONE-ACETAMINOPHEN 10-325 MG PO TABS
1.0000 | ORAL_TABLET | ORAL | 0 refills | Status: DC | PRN
  Filled 2023-10-04: qty 90, 15d supply, fill #0
  Filled ????-??-??: fill #0

## 2023-10-01 DIAGNOSIS — D5700 Hb-SS disease with crisis, unspecified: Secondary | ICD-10-CM | POA: Diagnosis not present

## 2023-10-04 ENCOUNTER — Other Ambulatory Visit: Payer: Self-pay

## 2023-10-04 ENCOUNTER — Other Ambulatory Visit (HOSPITAL_COMMUNITY): Payer: Self-pay

## 2023-10-12 ENCOUNTER — Other Ambulatory Visit: Payer: Self-pay | Admitting: Nurse Practitioner

## 2023-10-12 ENCOUNTER — Other Ambulatory Visit (HOSPITAL_COMMUNITY): Payer: Self-pay

## 2023-10-12 DIAGNOSIS — G894 Chronic pain syndrome: Secondary | ICD-10-CM

## 2023-10-12 DIAGNOSIS — D571 Sickle-cell disease without crisis: Secondary | ICD-10-CM

## 2023-10-12 NOTE — Telephone Encounter (Signed)
 Please advise in [place] absence. Thank you. Kh

## 2023-10-13 ENCOUNTER — Other Ambulatory Visit (HOSPITAL_COMMUNITY): Payer: Self-pay

## 2023-10-13 MED ORDER — OXYCODONE-ACETAMINOPHEN 10-325 MG PO TABS
1.0000 | ORAL_TABLET | ORAL | 0 refills | Status: DC | PRN
Start: 2023-10-13 — End: 2023-10-25
  Filled 2023-10-13 – 2023-10-17 (×2): qty 90, 15d supply, fill #0

## 2023-10-17 ENCOUNTER — Other Ambulatory Visit (HOSPITAL_COMMUNITY): Payer: Self-pay

## 2023-10-25 ENCOUNTER — Other Ambulatory Visit: Payer: Self-pay | Admitting: Nurse Practitioner

## 2023-10-25 ENCOUNTER — Other Ambulatory Visit (HOSPITAL_COMMUNITY): Payer: Self-pay

## 2023-10-25 DIAGNOSIS — D571 Sickle-cell disease without crisis: Secondary | ICD-10-CM

## 2023-10-25 DIAGNOSIS — G894 Chronic pain syndrome: Secondary | ICD-10-CM

## 2023-10-25 MED ORDER — OXYCODONE-ACETAMINOPHEN 10-325 MG PO TABS
1.0000 | ORAL_TABLET | ORAL | 0 refills | Status: DC | PRN
  Filled 2023-11-01: qty 90, 15d supply, fill #0
  Filled ????-??-??: fill #0

## 2023-10-25 NOTE — Telephone Encounter (Signed)
 Reviewed PDMP substance reporting system prior to prescribing opiate medications. No inconsistencies noted.   1. Sickle cell disease without crisis (HCC)  - oxyCODONE-acetaminophen (PERCOCET) 10-325 MG tablet; Take 1 tablet by mouth every 4 (four) hours as needed for up to 15 days for pain.  Dispense: 90 tablet; Refill: 0  2. Chronic pain syndrome  - oxyCODONE-acetaminophen (PERCOCET) 10-325 MG tablet; Take 1 tablet by mouth every 4 (four) hours as needed for up to 15 days for pain.  Dispense: 90 tablet; Refill: 0

## 2023-10-29 DIAGNOSIS — D649 Anemia, unspecified: Secondary | ICD-10-CM | POA: Diagnosis not present

## 2023-10-29 DIAGNOSIS — D72829 Elevated white blood cell count, unspecified: Secondary | ICD-10-CM | POA: Diagnosis not present

## 2023-10-29 DIAGNOSIS — E870 Hyperosmolality and hypernatremia: Secondary | ICD-10-CM | POA: Diagnosis not present

## 2023-10-29 DIAGNOSIS — D5700 Hb-SS disease with crisis, unspecified: Secondary | ICD-10-CM | POA: Diagnosis not present

## 2023-10-29 DIAGNOSIS — D72819 Decreased white blood cell count, unspecified: Secondary | ICD-10-CM | POA: Diagnosis not present

## 2023-10-29 DIAGNOSIS — E875 Hyperkalemia: Secondary | ICD-10-CM | POA: Diagnosis not present

## 2023-10-29 DIAGNOSIS — E871 Hypo-osmolality and hyponatremia: Secondary | ICD-10-CM | POA: Diagnosis not present

## 2023-10-29 DIAGNOSIS — E876 Hypokalemia: Secondary | ICD-10-CM | POA: Diagnosis not present

## 2023-10-29 DIAGNOSIS — E872 Acidosis, unspecified: Secondary | ICD-10-CM | POA: Diagnosis not present

## 2023-11-01 ENCOUNTER — Other Ambulatory Visit (HOSPITAL_COMMUNITY): Payer: Self-pay

## 2023-11-06 ENCOUNTER — Telehealth (HOSPITAL_COMMUNITY): Payer: Self-pay | Admitting: *Deleted

## 2023-11-06 ENCOUNTER — Non-Acute Institutional Stay (HOSPITAL_COMMUNITY)
Admission: AD | Admit: 2023-11-06 | Discharge: 2023-11-06 | Disposition: A | Discharge status: Home / Self Care | Source: Ambulatory Visit | Attending: Internal Medicine | Admitting: Internal Medicine

## 2023-11-06 ENCOUNTER — Ambulatory Visit: Payer: Self-pay

## 2023-11-06 DIAGNOSIS — F32A Depression, unspecified: Secondary | ICD-10-CM | POA: Insufficient documentation

## 2023-11-06 DIAGNOSIS — Z7901 Long term (current) use of anticoagulants: Secondary | ICD-10-CM | POA: Insufficient documentation

## 2023-11-06 DIAGNOSIS — G894 Chronic pain syndrome: Secondary | ICD-10-CM | POA: Insufficient documentation

## 2023-11-06 DIAGNOSIS — D638 Anemia in other chronic diseases classified elsewhere: Secondary | ICD-10-CM | POA: Diagnosis not present

## 2023-11-06 DIAGNOSIS — Z86718 Personal history of other venous thrombosis and embolism: Secondary | ICD-10-CM | POA: Diagnosis not present

## 2023-11-06 DIAGNOSIS — D5700 Hb-SS disease with crisis, unspecified: Secondary | ICD-10-CM | POA: Diagnosis not present

## 2023-11-06 DIAGNOSIS — F419 Anxiety disorder, unspecified: Secondary | ICD-10-CM | POA: Diagnosis not present

## 2023-11-06 LAB — CBC WITH DIFFERENTIAL/PLATELET
Abs Immature Granulocytes: 0.06 K/uL (ref 0.00–0.07)
Basophils Absolute: 0 K/uL (ref 0.0–0.1)
Basophils Relative: 0 %
Eosinophils Absolute: 0.3 K/uL (ref 0.0–0.5)
Eosinophils Relative: 3 %
HCT: 29.3 % — ABNORMAL LOW (ref 39.0–52.0)
Hemoglobin: 10 g/dL — ABNORMAL LOW (ref 13.0–17.0)
Immature Granulocytes: 1 %
Lymphocytes Relative: 22 %
Lymphs Abs: 2 K/uL (ref 0.7–4.0)
MCH: 29.4 pg (ref 26.0–34.0)
MCHC: 34.1 g/dL (ref 30.0–36.0)
MCV: 86.2 fL (ref 80.0–100.0)
Monocytes Absolute: 0.9 K/uL (ref 0.1–1.0)
Monocytes Relative: 9 %
Neutro Abs: 6.2 K/uL (ref 1.7–7.7)
Neutrophils Relative %: 65 %
Platelets: 429 K/uL — ABNORMAL HIGH (ref 150–400)
RBC: 3.4 MIL/uL — ABNORMAL LOW (ref 4.22–5.81)
RDW: 19.1 % — ABNORMAL HIGH (ref 11.5–15.5)
WBC: 9.5 K/uL (ref 4.0–10.5)
nRBC: 1.4 % — ABNORMAL HIGH (ref 0.0–0.2)

## 2023-11-06 LAB — COMPREHENSIVE METABOLIC PANEL WITH GFR
ALT: 11 U/L (ref 0–44)
AST: 36 U/L (ref 15–41)
Albumin: 4.5 g/dL (ref 3.5–5.0)
Alkaline Phosphatase: 84 U/L (ref 38–126)
Anion gap: 13 (ref 5–15)
BUN: 5 mg/dL — ABNORMAL LOW (ref 6–20)
CO2: 22 mmol/L (ref 22–32)
Calcium: 9.5 mg/dL (ref 8.9–10.3)
Chloride: 102 mmol/L (ref 98–111)
Creatinine, Ser: 0.62 mg/dL (ref 0.61–1.24)
GFR, Estimated: >60 mL/min (ref >60)
Glucose, Bld: 85 mg/dL (ref 70–99)
Potassium: 3.9 mmol/L (ref 3.5–5.1)
Sodium: 137 mmol/L (ref 135–145)
Total Bilirubin: 2.9 mg/dL — ABNORMAL HIGH (ref 0.0–1.2)
Total Protein: 8 g/dL (ref 6.5–8.1)

## 2023-11-06 LAB — RETICULOCYTES
Immature Retic Fract: 40.7 % — ABNORMAL HIGH (ref 2.3–15.9)
RBC.: 3.4 MIL/uL — ABNORMAL LOW (ref 4.22–5.81)
Retic Count, Absolute: 339.5 K/uL — ABNORMAL HIGH (ref 19.0–186.0)
Retic Ct Pct: 10 % — ABNORMAL HIGH (ref 0.4–3.1)

## 2023-11-06 LAB — LACTATE DEHYDROGENASE: LDH: 348 U/L — ABNORMAL HIGH (ref 98–192)

## 2023-11-06 MED ORDER — POLYETHYLENE GLYCOL 3350 17 G PO PACK: 17.0000 g | PACK | Freq: Every day | ORAL | Status: DC | PRN

## 2023-11-06 MED ORDER — KETOROLAC TROMETHAMINE 15 MG/ML IJ SOLN
15.0000 mg | Freq: Four times a day (QID) | INTRAMUSCULAR | Status: DC
  Administered 2023-11-06: 15 mg via INTRAVENOUS
  Filled 2023-11-06: qty 1

## 2023-11-06 MED ORDER — SENNOSIDES-DOCUSATE SODIUM 8.6-50 MG PO TABS
1.0000 | ORAL_TABLET | Freq: Two times a day (BID) | ORAL | Status: DC
  Administered 2023-11-06: 1 via ORAL
  Filled 2023-11-06: qty 1

## 2023-11-06 MED ORDER — DIPHENHYDRAMINE HCL 25 MG PO CAPS
25.0000 mg | ORAL_CAPSULE | ORAL | Status: DC | PRN
  Administered 2023-11-06: 25 mg via ORAL
  Filled 2023-11-06: qty 1

## 2023-11-06 MED ORDER — NALOXONE HCL 0.4 MG/ML IJ SOLN: 0.4000 mg | INTRAMUSCULAR | Status: DC | PRN

## 2023-11-06 MED ORDER — HYDROMORPHONE 1 MG/ML IV SOLN
INTRAVENOUS | Status: DC
  Administered 2023-11-06: 30 mg via INTRAVENOUS
  Filled 2023-11-06: qty 30

## 2023-11-06 MED ORDER — SODIUM CHLORIDE 0.9% FLUSH: 9.0000 mL | INTRAVENOUS | Status: DC | PRN

## 2023-11-06 MED ORDER — ONDANSETRON HCL 4 MG/2ML IJ SOLN: 4.0000 mg | Freq: Four times a day (QID) | INTRAMUSCULAR | Status: DC | PRN

## 2023-11-06 MED ORDER — ACETAMINOPHEN 500 MG PO TABS
1000.0000 mg | ORAL_TABLET | Freq: Once | ORAL | Status: AC
  Administered 2023-11-06: 1000 mg via ORAL
  Filled 2023-11-06: qty 2

## 2023-11-06 MED ORDER — SODIUM CHLORIDE 0.45 % IV SOLN
INTRAVENOUS | Status: DC
  Administered 2023-11-06: 125 mL via INTRAVENOUS

## 2023-11-06 NOTE — Telephone Encounter (Signed)
  FYI Only or Action Required?: Action required by provider: request for appointment.  Patient was last seen in primary care on 08/30/2023 by Paseda, Folashade R, FNP.  Called Nurse Triage reporting Back Pain.  Symptoms began today.  Interventions attempted: Prescription medications: Percocet.  Symptoms are: unchanged.  Triage Disposition: Go to ED Now (or PCP Triage) or Day Hospital   Patient/caregiver understands and will follow disposition?: yes       Copied from CRM #[id number]. Topic: Clinical - Red Word Triage >> Nov 06, 2023  7:35 AM [place] MATSU wrote: Red Word that prompted transfer to Nurse Triage: back and leg pain. Reason for Disposition  [1] SEVERE sickle cell pain (e.g., pain episode, sickle cell attack) AND [2] has pain management plan AND [3] NOT better after taking pain medicine per plan    There is a day hospital pt's can go to  Answer Assessment - Initial Assessment Questions 1. ONSET: When did the pain begin? (e.g., minutes, hours, days)     Last night  2. LOCATION: Where does it hurt? (upper, mid or lower back)     Lower back shoot down both legs  3. SEVERITY: How bad is the pain?  (e.g., Scale 1-10; mild, moderate, or severe)     9/10 4. PATTERN: Is the pain constant? (e.g., yes, no; constant, intermittent)      Yes  5. RADIATION: Does the pain shoot into your legs or somewhere else?     Yes  6. CAUSE:  What do you think is causing the back pain?      SCC 8. MEDICINES: What have you taken so far for the pain? (e.g., nothing, acetaminophen , NSAIDS)     Percocet  10. OTHER SYMPTOMS: Do you have any other symptoms? (e.g., fever, abdomen pain, burning with urination, blood in urine)       No  Answer Assessment - Initial Assessment Questions 1. ONSET: When did the pain begin?      This am  2. LOCATION: Where does it hurt?      Lower back  3. SEVERITY: How bad is the pain?  (e.g., Scale 1-10; mild, moderate, or severe)     severe 4.  PATTERN: Is the pain constant? (e.g., yes, no; constant, intermittent)      yes 5. CAUSE:  What do you think is causing the pain? Is this pain similar to the acute pain attacks you have had in the past? (e.g., similar location, quality, intensity)     Sickle cell crisis 6. PAIN MEDICINES:      Percocet 7. OTHER SYMPTOMS: Do you have any other symptoms? (e.g., fever, chest pain, shortness of breath, new areas of swelling or redness)     no 8. PREGNANCY: Is there any chance you are pregnant? When was your last menstrual period?     N/a  Protocols used: Back Pain-A-AH, Sickle Cell Disease - Acute Pain Episode (Crisis)-A-AH

## 2023-11-06 NOTE — Discharge Summary (Cosign Needed Addendum)
 Physician Discharge Summary  Timothy Marks FMW:[id number] DOB: [birth year] DOA: 11/06/2023  PCP: Paseda, Folashade R, FNP  Admit date: 11/06/2023  Discharge date: 11/06/2023  Time spent: 30 minutes  Discharge Diagnoses:  Principal Problem:   Sickle cell anemia with pain [facility])   Discharge Condition: Stable  Diet recommendation: Regular  History of present illness:   Timothy Marks is a 33 y.o. male with history of sickle cell disease, DVT on Eliquis , anxiety and depression, avascular necrosis of the left shoulder, anemia of chronic disease, and chronic pain syndrome who presented to the day hospital for evaluation of sickle cell pain. Patient states he has had low back pain, his home pain medications did not provide any significant relief. He rates his pain a 10/10    Hospital Course:  Timothy Marks was admitted to the day hospital with sickle cell painful crisis. Patient was treated with IV fluid, weight based IV Dilaudid  PCA, IV Toradol , clinician assisted doses as deemed appropriate, and other adjunct therapies per sickle cell pain management protocol. [place] showed significant improvement symptomatically, pain improved from 10/10 to 6/10 at the time of discharge. Patient was discharged home in a hemodynamically stable condition. Timothy Marks will follow-up at the clinic as previously scheduled, continue with home medications as per prior to admission.  Discharge Instructions We discussed the need for good hydration, monitoring of hydration status, avoidance of heat, cold, stress, and infection triggers. We discussed the need to be compliant with taking home medications. Timothy Marks was reminded of the need to seek medical attention immediately if any symptom of bleeding, anemia, or infection occurs.  Discharge Exam: Vitals:   11/06/23 1505 11/06/23 1606  BP: (!) 99/55   Pulse: 83   Resp: 12 12  Temp: (!) 97.5 F (36.4 C)   SpO2: 100%     General appearance: alert, cooperative  and no distress Eyes: conjunctivae/corneas clear. PERRL, EOM's intact. Fundi benign. Neck: no adenopathy, no carotid bruit, no JVD, supple, symmetrical, trachea midline and thyroid  not enlarged, symmetric, no tenderness/mass/nodules Back: symmetric, no curvature. ROM normal. No CVA tenderness. Resp: clear to auscultation bilaterally Chest wall: no tenderness Cardio: regular rate and rhythm, S1, S2 normal, no murmur, click, rub or gallop GI: soft, non-tender; bowel sounds normal; no masses,  no organomegaly Extremities: extremities normal, atraumatic, no cyanosis or edema Pulses: 2+ and symmetric Skin: Skin color, texture, turgor normal. No rashes or lesions Neurologic: Grossly normal  Discharge Instructions     Call MD for:  severe uncontrolled pain   Complete by: As directed    Call MD for:  temperature >100.4   Complete by: As directed    Diet - low sodium heart healthy   Complete by: As directed    Increase activity slowly   Complete by: As directed       Allergies as of 11/06/2023       Reactions   Buprenorphine Hcl Hives   Levaquin  [levofloxacin ] Itching   Meperidine Rash   Morphine  Hives   Takes MS IR at home   Morphine  And Codeine Hives   Takes MSIR PTA   Tramadol Hives   Vancomycin Itching   Zosyn [piperacillin Sod-tazobactam So] Itching, Rash, Other (See Comments)   Has taken rocephin  in the past        Medication List     TAKE these medications    cetirizine 10 MG tablet Commonly known as: ZYRTEC Take 10 mg by mouth daily.   diphenhydrAMINE  25 mg capsule Commonly known  as: BENADRYL  Take 1 capsule (25 mg total) by mouth every 6 (six) hours as needed for itching.   Eliquis  5 MG Tabs tablet Generic drug: apixaban  Take 1 tablet (5 mg total) by mouth 2 (two) times daily.   folic acid  1 MG tablet Commonly known as: FOLVITE  Take 1 tablet (1 mg total) by mouth daily.   gabapentin  300 MG capsule Commonly known as: NEURONTIN  Take 1 capsule (300 mg  total) by mouth 3 (three) times daily.   hydroxyurea  500 MG capsule Commonly known as: HYDREA  Take 1 capsule (500 mg total) by mouth 2 (two) times daily. May take with food to minimize GI side effects.   ibuprofen  800 MG tablet Commonly known as: ADVIL  Take 1 tablet (800 mg total) by mouth every 8 (eight) hours as needed.   naloxone  4 MG/0.1ML Liqd nasal spray kit Commonly known as: NARCAN  Place 1 spray into the nose once.   oxyCODONE -acetaminophen  10-325 MG tablet Commonly known as: PERCOCET Take 1 tablet by mouth every 4 (four) hours as needed for up to 15 days for pain.   Vitamin D  (Ergocalciferol ) 1.25 MG (50000 UNIT) Caps capsule Commonly known as: DRISDOL  Take 1 capsule (50,000 Units total) by mouth once a week.       Allergies  Allergen Reactions   Buprenorphine Hcl Hives   Levaquin  [Levofloxacin ] Itching   Meperidine Rash   Morphine  Hives    Takes MS IR at home   Morphine  And Codeine Hives    Takes MSIR PTA   Tramadol Hives   Vancomycin Itching   Zosyn [Piperacillin Sod-Tazobactam So] Itching, Rash and Other (See Comments)    Has taken rocephin  in the past     Significant Diagnostic Studies: No results found.  Signed:  Homer CHRISTELLA Cover NP   11/06/2023, 4:12 PM

## 2023-11-06 NOTE — H&P (Signed)
 [facility] History and Physical  Timothy Marks FMW:[id number] DOB: Apr 14, 1990 DOA: 11/06/2023  PCP: Paseda, Folashade R, FNP   Chief Complaint:  Chief Complaint  Patient presents with   Sickle Cell Pain Crisis    HPI: Timothy Marks is a 33 y.o. male with history of sickle cell disease, DVT on Eliquis , anxiety and depression, avascular necrosis of the left shoulder, anemia of chronic disease, and chronic pain syndrome who presented to the day hospital for evaluation of sickle cell pain. Patient states he has had low back pain, his home pain medications did not provide any significant relief. He rates his pain a 10/10  Systemic Review: General: The patient denies anorexia, fever, weight loss Cardiac: Denies chest pain, syncope, palpitations, pedal edema  Respiratory: Denies cough, shortness of breath, wheezing GI: Denies severe indigestion/heartburn, abdominal pain, nausea, vomiting, diarrhea and constipation GU: Denies hematuria, incontinence, dysuria  Musculoskeletal: Lower back tenderness Skin: Denies suspicious skin lesions Neurologic: Denies focal weakness or numbness, change in vision  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   Sickle cell anemia (HCC) 02-23-1988   Vitamin D  deficiency 2015    Past Surgical History:  Procedure Laterality Date   CHOLECYSTECTOMY      Allergies  Allergen Reactions   Buprenorphine Hcl Hives   Levaquin  [Levofloxacin ] Itching   Meperidine Rash   Morphine  Hives    Takes MS IR at home   Morphine  And Codeine Hives    Takes MSIR PTA   Tramadol Hives   Vancomycin Itching   Zosyn [Piperacillin Sod-Tazobactam So] Itching, Rash and Other (See Comments)    Has taken rocephin  in the past    Family History  Problem Relation Age of  Onset   Sickle cell trait Mother    Sickle cell trait Father       Prior to Admission medications   Medication Sig Start Date End Date Taking? Authorizing Provider  apixaban  (ELIQUIS ) 5 MG TABS tablet Take 1 tablet (5 mg total) by mouth 2 (two) times daily. 08/30/23   Paseda, Folashade R, FNP  cetirizine (ZYRTEC) 10 MG tablet Take 10 mg by mouth daily.    [provider]  diphenhydrAMINE  (BENADRYL ) 25 mg capsule Take 1 capsule (25 mg total) by mouth every 6 (six) hours as needed for itching. 01/17/23   Paseda, Folashade R, FNP  ergocalciferol  (VITAMIN D2) 1.25 MG (50000 UT) capsule Take 1 capsule (50,000 Units total) by mouth once a week. 05/16/23     folic acid  (FOLVITE ) 1 MG tablet Take 1 tablet (1 mg total) by mouth daily. 08/30/23   Paseda, Folashade R, FNP  gabapentin  (NEURONTIN ) 300 MG capsule Take 1 capsule (300 mg total) by mouth 3 (three) times daily. Patient not taking: Reported on 05/11/2023 05/03/23   Paseda, Folashade R, FNP  hydroxyurea  (HYDREA ) 500 MG capsule Take 1 capsule (500 mg total) by mouth 2 (two) times daily. May take with food to minimize GI side effects. 06/19/23   Paseda, Folashade R, FNP  ibuprofen  (ADVIL ) 800 MG tablet Take 1 tablet (800 mg total) by mouth every 8 (eight) hours as needed. 08/30/23   Paseda, Folashade R, FNP  naloxone  (NARCAN ) nasal spray 4 mg/0.1 mL Place 1 spray into the nose once. 09/04/22   [provider]  oxyCODONE -acetaminophen  (  PERCOCET) 10-325 MG tablet Take 1 tablet by mouth every 4 (four) hours as needed for up to 15 days for pain. 11/01/23 11/16/23  Paseda, Folashade R, FNP     Physical Exam: Vitals:   11/06/23 1032 11/06/23 1057  BP: 105/65   Pulse: 68   Resp: 16 (!) 22  Temp: 98.6 F (37 C)   TempSrc: Temporal   SpO2: 100%     General: Alert, awake, afebrile, anicteric, not in obvious distress HEENT: Normocephalic and Atraumatic, Mucous membranes pink                PERRLA; EOM intact; No scleral icterus,                  Nares: Patent, Oropharynx: Clear, Fair Dentition                 Neck: FROM, no cervical lymphadenopathy, thyromegaly, carotid bruit or JVD;  CHEST WALL: No tenderness  CHEST: Normal respiration, clear to auscultation bilaterally  HEART: Regular rate and rhythm; no murmurs rubs or gallops  BACK: No kyphosis or scoliosis; no CVA tenderness  ABDOMEN: Positive Bowel Sounds, soft, non-tender; no masses, no organomegaly EXTREMITIES: No cyanosis, clubbing, or edema SKIN:  no rash or ulceration  CNS: Alert and Oriented x 4, Nonfocal exam, CN 2-12 intact  Labs on Admission:  Basic Metabolic Panel: No results for input(s): NA, K, CL, CO2, GLUCOSE, BUN, CREATININE, CALCIUM, MG, PHOS in the last 168 hours. Liver Function Tests: No results for input(s): AST, ALT, ALKPHOS, BILITOT, PROT, ALBUMIN in the last 168 hours. No results for input(s): LIPASE, AMYLASE in the last 168 hours. No results for input(s): AMMONIA in the last 168 hours. CBC: No results for input(s): WBC, NEUTROABS, HGB, HCT, MCV, PLT in the last 168 hours. Cardiac Enzymes: No results for input(s): CKTOTAL, CKMB, CKMBINDEX, TROPONINI in the last 168 hours.  BNP (last 3 results) No results for input(s): BNP in the last 8760 hours.  ProBNP (last 3 results) No results for input(s): PROBNP in the last 8760 hours.  CBG: No results for input(s): GLUCAP in the last 168 hours.   Assessment/Plan Principal Problem:   Sickle cell anemia with pain (HCC)  Admits to the Day Hospital for extended observation IVF 0.45% Saline @ 125 mls/hour Weight based Dilaudid  PCA started within 30 minutes of admission IV Toradol  15 mg X 1  doses Acetaminophen  1000 mg x 1 dose Labs: CBCD, CMP, Retic Count and LDH Monitor vitals very closely, Re-evaluate pain scale every hour 2 L of Oxygen  by [place] Patient will be re-evaluated for pain in the context of function and relationship to  baseline as care progresses. If no significant relieve from pain (remains above 5/10) will transfer patient to inpatient services for further evaluation and management  Code Status: Full  Family Communication: None  DVT Prophylaxis: Ambulate as tolerated   Time spent: 35 Minutes  Homer CHRISTELLA Cover NP   If 7PM-7AM, please contact night-coverage [email or web address] 11/06/2023, 11:11 AM

## 2023-11-06 NOTE — Telephone Encounter (Signed)
 Patient called this am wanting to be seen for lower back pain 10/10 took Percocet 1 tab at 540 am last seen in ED 09/14, denies chest pain, N&V, abd, pain , diarrhea , or fever, no symptoms of flu or covid.  Informed patient will update MD/NP and advise if can be seen today.  Pt called back at [phone]  he can be seen and he has his own transportation.

## 2023-11-06 NOTE — Telephone Encounter (Signed)
 Called [phone] and gave report to Therisa at [facility].

## 2023-11-06 NOTE — Telephone Encounter (Signed)
 Pt is seen in the day hospital. Timothy Marks

## 2023-11-06 NOTE — Progress Notes (Signed)
 Diagnosis: sickle cell crisis    Provider: Homer Cover    Procedure: Dilaudid  PCA pump    Note: patient seen in Day hospital for sickle cell pain 10/10 lower back. He was given Tylenol  650 mg po , Senoket  and Benadryl  25 mg po as prescribed.  0/5/10/3 settings, pt received Tylenol   , Benadryl  , Senokot and Toradol  IVP, Vital signs stable upon discharge , patient declined AVS , pt hydrated with 0.45% NS.  Pt pain from 10/10 to 7/10, but states he is ready to go home now.

## 2023-11-12 ENCOUNTER — Other Ambulatory Visit: Payer: Self-pay | Admitting: Nurse Practitioner

## 2023-11-12 DIAGNOSIS — D571 Sickle-cell disease without crisis: Secondary | ICD-10-CM

## 2023-11-12 DIAGNOSIS — G894 Chronic pain syndrome: Secondary | ICD-10-CM

## 2023-11-13 ENCOUNTER — Other Ambulatory Visit (HOSPITAL_COMMUNITY): Payer: Self-pay

## 2023-11-13 MED ORDER — OXYCODONE-ACETAMINOPHEN 10-325 MG PO TABS
1.0000 | ORAL_TABLET | ORAL | 0 refills | Status: DC | PRN
  Filled 2023-11-16: qty 90, 15d supply, fill #0

## 2023-11-13 NOTE — Telephone Encounter (Signed)
 Reviewed PDMP substance reporting system prior to prescribing opiate medications. No inconsistencies noted.   1. Sickle cell disease without crisis (HCC)  - oxyCODONE-acetaminophen (PERCOCET) 10-325 MG tablet; Take 1 tablet by mouth every 4 (four) hours as needed for up to 15 days for pain.  Dispense: 90 tablet; Refill: 0  2. Chronic pain syndrome  - oxyCODONE-acetaminophen (PERCOCET) 10-325 MG tablet; Take 1 tablet by mouth every 4 (four) hours as needed for up to 15 days for pain.  Dispense: 90 tablet; Refill: 0

## 2023-11-16 ENCOUNTER — Other Ambulatory Visit (HOSPITAL_COMMUNITY): Payer: Self-pay

## 2023-11-16 ENCOUNTER — Other Ambulatory Visit: Payer: Self-pay

## 2023-11-19 ENCOUNTER — Inpatient Hospital Stay (HOSPITAL_COMMUNITY)
Admission: EM | Admit: 2023-11-19 | Discharge: 2023-11-22 | DRG: 812 | Disposition: A | Discharge status: Home / Self Care | Attending: Internal Medicine | Admitting: Internal Medicine

## 2023-11-19 ENCOUNTER — Encounter (HOSPITAL_COMMUNITY): Payer: Self-pay

## 2023-11-19 ENCOUNTER — Observation Stay (HOSPITAL_COMMUNITY)

## 2023-11-19 ENCOUNTER — Other Ambulatory Visit: Payer: Self-pay

## 2023-11-19 DIAGNOSIS — F419 Anxiety disorder, unspecified: Secondary | ICD-10-CM | POA: Diagnosis present

## 2023-11-19 DIAGNOSIS — D5700 Hb-SS disease with crisis, unspecified: Secondary | ICD-10-CM | POA: Diagnosis not present

## 2023-11-19 DIAGNOSIS — Z885 Allergy status to narcotic agent status: Secondary | ICD-10-CM | POA: Diagnosis not present

## 2023-11-19 DIAGNOSIS — Z832 Family history of diseases of the blood and blood-forming organs and certain disorders involving the immune mechanism: Secondary | ICD-10-CM

## 2023-11-19 DIAGNOSIS — G894 Chronic pain syndrome: Secondary | ICD-10-CM | POA: Diagnosis present

## 2023-11-19 DIAGNOSIS — Z79899 Other long term (current) drug therapy: Secondary | ICD-10-CM

## 2023-11-19 DIAGNOSIS — F32A Depression, unspecified: Secondary | ICD-10-CM | POA: Diagnosis present

## 2023-11-19 DIAGNOSIS — R059 Cough, unspecified: Secondary | ICD-10-CM | POA: Diagnosis present

## 2023-11-19 DIAGNOSIS — Z9049 Acquired absence of other specified parts of digestive tract: Secondary | ICD-10-CM | POA: Diagnosis not present

## 2023-11-19 DIAGNOSIS — R918 Other nonspecific abnormal finding of lung field: Secondary | ICD-10-CM | POA: Diagnosis not present

## 2023-11-19 DIAGNOSIS — E876 Hypokalemia: Secondary | ICD-10-CM | POA: Diagnosis present

## 2023-11-19 DIAGNOSIS — Z87891 Personal history of nicotine dependence: Secondary | ICD-10-CM | POA: Diagnosis not present

## 2023-11-19 DIAGNOSIS — E559 Vitamin D deficiency, unspecified: Secondary | ICD-10-CM | POA: Diagnosis present

## 2023-11-19 DIAGNOSIS — D72829 Elevated white blood cell count, unspecified: Secondary | ICD-10-CM | POA: Diagnosis present

## 2023-11-19 DIAGNOSIS — Z86718 Personal history of other venous thrombosis and embolism: Secondary | ICD-10-CM | POA: Diagnosis not present

## 2023-11-19 DIAGNOSIS — Z7901 Long term (current) use of anticoagulants: Secondary | ICD-10-CM | POA: Diagnosis not present

## 2023-11-19 DIAGNOSIS — D638 Anemia in other chronic diseases classified elsewhere: Secondary | ICD-10-CM | POA: Diagnosis present

## 2023-11-19 DIAGNOSIS — R0989 Other specified symptoms and signs involving the circulatory and respiratory systems: Secondary | ICD-10-CM | POA: Diagnosis not present

## 2023-11-19 DIAGNOSIS — Z888 Allergy status to other drugs, medicaments and biological substances status: Secondary | ICD-10-CM

## 2023-11-19 LAB — COMPREHENSIVE METABOLIC PANEL WITH GFR
ALT: <5 U/L (ref 0–44)
AST: 27 U/L (ref 15–41)
Albumin: 4.3 g/dL (ref 3.5–5.0)
Alkaline Phosphatase: 84 U/L (ref 38–126)
Anion gap: 11 (ref 5–15)
BUN: <5 mg/dL — ABNORMAL LOW (ref 6–20)
CO2: 24 mmol/L (ref 22–32)
Calcium: 9.3 mg/dL (ref 8.9–10.3)
Chloride: 104 mmol/L (ref 98–111)
Creatinine, Ser: 0.58 mg/dL — ABNORMAL LOW (ref 0.61–1.24)
GFR, Estimated: >60 mL/min (ref >60)
Glucose, Bld: 85 mg/dL (ref 70–99)
Potassium: 3.2 mmol/L — ABNORMAL LOW (ref 3.5–5.1)
Sodium: 138 mmol/L (ref 135–145)
Total Bilirubin: 3.2 mg/dL — ABNORMAL HIGH (ref 0.0–1.2)
Total Protein: 7.7 g/dL (ref 6.5–8.1)

## 2023-11-19 LAB — CBC WITH DIFFERENTIAL/PLATELET
Abs Immature Granulocytes: 0.04 K/uL (ref 0.00–0.07)
Basophils Absolute: 0 K/uL (ref 0.0–0.1)
Basophils Relative: 0 %
Eosinophils Absolute: 0.2 K/uL (ref 0.0–0.5)
Eosinophils Relative: 2 %
HCT: 29.6 % — ABNORMAL LOW (ref 39.0–52.0)
Hemoglobin: 9.8 g/dL — ABNORMAL LOW (ref 13.0–17.0)
Immature Granulocytes: 0 %
Lymphocytes Relative: 25 %
Lymphs Abs: 2.8 K/uL (ref 0.7–4.0)
MCH: 28.8 pg (ref 26.0–34.0)
MCHC: 33.1 g/dL (ref 30.0–36.0)
MCV: 87.1 fL (ref 80.0–100.0)
Monocytes Absolute: 0.8 K/uL (ref 0.1–1.0)
Monocytes Relative: 8 %
Neutro Abs: 7.2 K/uL (ref 1.7–7.7)
Neutrophils Relative %: 65 %
Platelets: 597 K/uL — ABNORMAL HIGH (ref 150–400)
RBC: 3.4 MIL/uL — ABNORMAL LOW (ref 4.22–5.81)
RDW: 20.1 % — ABNORMAL HIGH (ref 11.5–15.5)
WBC: 11.1 K/uL — ABNORMAL HIGH (ref 4.0–10.5)
nRBC: 3 % — ABNORMAL HIGH (ref 0.0–0.2)

## 2023-11-19 LAB — RETICULOCYTES
Immature Retic Fract: 37.5 % — ABNORMAL HIGH (ref 2.3–15.9)
RBC.: 3.4 MIL/uL — ABNORMAL LOW (ref 4.22–5.81)
Retic Count, Absolute: 406 K/uL — ABNORMAL HIGH (ref 19.0–186.0)
Retic Ct Pct: 11.9 % — ABNORMAL HIGH (ref 0.4–3.1)

## 2023-11-19 MED ORDER — HYDROMORPHONE HCL 1 MG/ML IJ SOLN
2.0000 mg | INTRAMUSCULAR | Status: AC
  Administered 2023-11-19: 2 mg via INTRAVENOUS
  Filled 2023-11-19 (×2): qty 2

## 2023-11-19 MED ORDER — OXYCODONE-ACETAMINOPHEN 10-325 MG PO TABS: 1.0000 | ORAL_TABLET | ORAL | Status: DC | PRN

## 2023-11-19 MED ORDER — SODIUM CHLORIDE 0.9% FLUSH: 9.0000 mL | INTRAVENOUS | Status: DC | PRN

## 2023-11-19 MED ORDER — POLYETHYLENE GLYCOL 3350 17 G PO PACK: 17.0000 g | PACK | Freq: Every day | ORAL | Status: DC | PRN

## 2023-11-19 MED ORDER — DIPHENHYDRAMINE HCL 50 MG/ML IJ SOLN
25.0000 mg | Freq: Four times a day (QID) | INTRAMUSCULAR | Status: DC | PRN
  Administered 2023-11-20 – 2023-11-21 (×3): 25 mg via INTRAVENOUS
  Filled 2023-11-19 (×3): qty 1

## 2023-11-19 MED ORDER — NALOXONE HCL 0.4 MG/ML IJ SOLN: 0.4000 mg | INTRAMUSCULAR | Status: DC | PRN

## 2023-11-19 MED ORDER — DIPHENHYDRAMINE HCL 25 MG PO CAPS
25.0000 mg | ORAL_CAPSULE | Freq: Once | ORAL | Status: AC
  Administered 2023-11-19: 25 mg via ORAL
  Filled 2023-11-19: qty 1

## 2023-11-19 MED ORDER — KETOROLAC TROMETHAMINE 15 MG/ML IJ SOLN
15.0000 mg | Freq: Four times a day (QID) | INTRAMUSCULAR | Status: DC
  Administered 2023-11-19 – 2023-11-22 (×10): 15 mg via INTRAVENOUS
  Filled 2023-11-19 (×11): qty 1

## 2023-11-19 MED ORDER — SENNOSIDES-DOCUSATE SODIUM 8.6-50 MG PO TABS
1.0000 | ORAL_TABLET | Freq: Two times a day (BID) | ORAL | Status: DC
  Administered 2023-11-19 – 2023-11-20 (×3): 1 via ORAL
  Filled 2023-11-19 (×5): qty 1

## 2023-11-19 MED ORDER — HYDROXYUREA 500 MG PO CAPS
500.0000 mg | ORAL_CAPSULE | Freq: Two times a day (BID) | ORAL | Status: DC
  Administered 2023-11-20 – 2023-11-21 (×3): 500 mg via ORAL
  Filled 2023-11-19 (×3): qty 1

## 2023-11-19 MED ORDER — FOLIC ACID 1 MG PO TABS
1.0000 mg | ORAL_TABLET | Freq: Every day | ORAL | Status: DC
  Administered 2023-11-20 – 2023-11-21 (×2): 1 mg via ORAL
  Filled 2023-11-19 (×2): qty 1

## 2023-11-19 MED ORDER — APIXABAN 5 MG PO TABS
5.0000 mg | ORAL_TABLET | Freq: Two times a day (BID) | ORAL | Status: DC
  Administered 2023-11-19 – 2023-11-21 (×5): 5 mg via ORAL
  Filled 2023-11-19: qty 1
  Filled 2023-11-19 (×2): qty 2
  Filled 2023-11-19 (×2): qty 1

## 2023-11-19 MED ORDER — HYDROMORPHONE HCL 1 MG/ML IJ SOLN
INTRAMUSCULAR | Status: AC
  Filled 2023-11-19: qty 2

## 2023-11-19 MED ORDER — OXYCODONE HCL 5 MG PO TABS
5.0000 mg | ORAL_TABLET | ORAL | Status: DC | PRN
Start: 2023-11-19 — End: 2023-11-22
  Administered 2023-11-20 – 2023-11-21 (×4): 5 mg via ORAL
  Filled 2023-11-19 (×5): qty 1

## 2023-11-19 MED ORDER — ONDANSETRON HCL 4 MG/2ML IJ SOLN
4.0000 mg | INTRAMUSCULAR | Status: DC | PRN
  Administered 2023-11-19 – 2023-11-21 (×4): 4 mg via INTRAVENOUS
  Filled 2023-11-19 (×4): qty 2

## 2023-11-19 MED ORDER — HYDROMORPHONE 1 MG/ML IV SOLN
INTRAVENOUS | Status: DC
  Administered 2023-11-20: 1 mg via INTRAVENOUS
  Administered 2023-11-20: 30 mg via INTRAVENOUS
  Administered 2023-11-21: 3.5 mg via INTRAVENOUS
  Administered 2023-11-21: 1.5 mg via INTRAVENOUS
  Administered 2023-11-21 (×2): 3.5 mg via INTRAVENOUS
  Administered 2023-11-21: 30 mg via INTRAVENOUS
  Administered 2023-11-21: 3 mg via INTRAVENOUS
  Administered 2023-11-22: 4.5 mg via INTRAVENOUS
  Administered 2023-11-22: 6.5 mg via INTRAVENOUS
  Filled 2023-11-19 (×2): qty 30

## 2023-11-19 MED ORDER — SODIUM CHLORIDE 0.9 % IV SOLN
12.5000 mg | Freq: Once | INTRAVENOUS | Status: AC
  Administered 2023-11-19: 12.5 mg via INTRAVENOUS
  Filled 2023-11-19: qty 12.5

## 2023-11-19 MED ORDER — HYDROMORPHONE HCL 1 MG/ML IJ SOLN
2.0000 mg | INTRAMUSCULAR | Status: AC
  Administered 2023-11-19: 2 mg via INTRAVENOUS

## 2023-11-19 MED ORDER — HYDROMORPHONE HCL 1 MG/ML IJ SOLN
2.0000 mg | INTRAMUSCULAR | Status: DC | PRN
  Administered 2023-11-19 – 2023-11-20 (×8): 2 mg via INTRAVENOUS
  Filled 2023-11-19 (×8): qty 2

## 2023-11-19 MED ORDER — OXYCODONE-ACETAMINOPHEN 5-325 MG PO TABS
1.0000 | ORAL_TABLET | ORAL | Status: DC | PRN
  Administered 2023-11-19 – 2023-11-21 (×8): 1 via ORAL
  Filled 2023-11-19 (×7): qty 1

## 2023-11-19 MED ORDER — SODIUM CHLORIDE 0.45 % IV SOLN: INTRAVENOUS | Status: AC

## 2023-11-19 MED ORDER — POTASSIUM CHLORIDE CRYS ER 20 MEQ PO TBCR
40.0000 meq | EXTENDED_RELEASE_TABLET | Freq: Once | ORAL | Status: AC
  Administered 2023-11-19: 40 meq via ORAL
  Filled 2023-11-19: qty 2

## 2023-11-19 MED ORDER — HYDROMORPHONE HCL 1 MG/ML IJ SOLN
2.0000 mg | INTRAMUSCULAR | Status: AC
  Administered 2023-11-19: 2 mg via INTRAVENOUS
  Filled 2023-11-19: qty 2

## 2023-11-19 MED ORDER — VITAMIN D (ERGOCALCIFEROL) 1.25 MG (50000 UNIT) PO CAPS: 50000.0000 [IU] | ORAL_CAPSULE | ORAL | Status: DC

## 2023-11-19 NOTE — ED Triage Notes (Signed)
 Pt came in for back pain that started today. Pt stated he had his percocet today with no relief.   Hx sickle cell crisis

## 2023-11-19 NOTE — ED Notes (Signed)
 Pt heard screaming from hallway. Pt asking for more pain medication. Pt advised that it was too soon to given another dose of PRN meds, but another dose will be administered when possible. Pt given hot packs.

## 2023-11-19 NOTE — ED Provider Notes (Signed)
 [place] EMERGENCY DEPARTMENT AT [facility] Provider Note   CSN: [id number] Arrival date & time: 11/19/23  1651     Patient presents with: No chief complaint on file.   Timothy Marks is a 33 y.o. male.   33 year old male with history of sickle disease presents with pain to his lower back similar to his prior vaso-occlusive crisis.  Pain has been unrelieved with home oxycodone .  Denies any fever or chills.  No cough or congestion.  No nausea or vomiting.  No urinary symptoms.  Thinks that the change of weather may have caused his symptoms to start       Prior to Admission medications   Medication Sig Start Date End Date Taking? Authorizing Provider  apixaban  (ELIQUIS ) 5 MG TABS tablet Take 1 tablet (5 mg total) by mouth 2 (two) times daily. 08/30/23   Paseda, Folashade R, FNP  cetirizine (ZYRTEC) 10 MG tablet Take 10 mg by mouth daily.    [provider]  diphenhydrAMINE  (BENADRYL ) 25 mg capsule Take 1 capsule (25 mg total) by mouth every 6 (six) hours as needed for itching. 01/17/23   Paseda, Folashade R, FNP  ergocalciferol  (VITAMIN D2) 1.25 MG (50000 UT) capsule Take 1 capsule (50,000 Units total) by mouth once a week. 05/16/23     folic acid  (FOLVITE ) 1 MG tablet Take 1 tablet (1 mg total) by mouth daily. 08/30/23   Paseda, Folashade R, FNP  gabapentin  (NEURONTIN ) 300 MG capsule Take 1 capsule (300 mg total) by mouth 3 (three) times daily. 05/03/23   Paseda, Folashade R, FNP  hydroxyurea  (HYDREA ) 500 MG capsule Take 1 capsule (500 mg total) by mouth 2 (two) times daily. May take with food to minimize GI side effects. 06/19/23   Paseda, Folashade R, FNP  ibuprofen  (ADVIL ) 800 MG tablet Take 1 tablet (800 mg total) by mouth every 8 (eight) hours as needed. 08/30/23   Paseda, Folashade R, FNP  naloxone  (NARCAN ) nasal spray 4 mg/0.1 mL Place 1 spray into the nose once. 09/04/22   [provider]  oxyCODONE -acetaminophen  (PERCOCET) 10-325 MG tablet Take 1 tablet  by mouth every 4 (four) hours as needed for up to 15 days for pain. 11/16/23 12/01/23  Paseda, Folashade R, FNP    Allergies: Buprenorphine hcl, Levaquin  [levofloxacin ], Meperidine, Morphine , Morphine  and codeine, Tramadol, Vancomycin, and Zosyn [piperacillin sod-tazobactam so]    Review of Systems  All other systems reviewed and are negative.   Updated Vital Signs BP 138/67 (BP Location: Left Arm)   Pulse 83   Temp 98.4 F (36.9 C) (Oral)   Resp 16   SpO2 100%   Physical Exam Vitals and nursing note reviewed.  Constitutional:      General: He is not in acute distress.    Appearance: Normal appearance. He is well-developed. He is not toxic-appearing.  HENT:     Head: Normocephalic and atraumatic.  Eyes:     General: Lids are normal.     Conjunctiva/sclera: Conjunctivae normal.     Pupils: Pupils are equal, round, and reactive to light.  Neck:     Thyroid : No thyroid  mass.     Trachea: No tracheal deviation.  Cardiovascular:     Rate and Rhythm: Normal rate and regular rhythm.     Heart sounds: Normal heart sounds. No murmur heard.    No gallop.  Pulmonary:     Effort: Pulmonary effort is normal. No respiratory distress.     Breath sounds: Normal breath sounds.  No stridor. No decreased breath sounds, wheezing, rhonchi or rales.  Abdominal:     General: There is no distension.     Palpations: Abdomen is soft.     Tenderness: There is no abdominal tenderness. There is no rebound.  Musculoskeletal:        General: No tenderness. Normal range of motion.     Cervical back: Normal range of motion and neck supple.  Skin:    General: Skin is warm and dry.     Findings: No abrasion or rash.  Neurological:     Mental Status: He is alert and oriented to person, place, and time. Mental status is at baseline.     GCS: GCS eye subscore is 4. GCS verbal subscore is 5. GCS motor subscore is 6.     Cranial Nerves: No cranial nerve deficit.     Sensory: No sensory deficit.     Motor:  Motor function is intact.  Psychiatric:        Attention and Perception: Attention normal.        Speech: Speech normal.        Behavior: Behavior normal.     (all labs ordered are listed, but only abnormal results are displayed) Labs Reviewed  CBC WITH DIFFERENTIAL/PLATELET  RETICULOCYTES  COMPREHENSIVE METABOLIC PANEL WITH GFR    EKG: None  Radiology: No results found.   Procedures   Medications Ordered in the ED  0.45 % sodium chloride  infusion (has no administration in time range)  HYDROmorphone  (DILAUDID ) injection 2 mg (has no administration in time range)  HYDROmorphone  (DILAUDID ) injection 2 mg (has no administration in time range)  HYDROmorphone  (DILAUDID ) injection 2 mg (has no administration in time range)  diphenhydrAMINE  (BENADRYL ) 12.5 mg in sodium chloride  0.9 % 50 mL IVPB (has no administration in time range)  ondansetron  (ZOFRAN ) injection 4 mg (has no administration in time range)                                    Medical Decision Making Amount and/or Complexity of Data Reviewed Labs: ordered.  Risk Prescription drug management. Decision regarding hospitalization.   Patient given IV hydration here in 3 doses of Dilaudid  2 mg.  Patient hemoglobin is stable.  Patient continues to be uncomfortable at this time.  He will need to be admitted for sickle cell crisis  CRITICAL CARE Performed by: Curtistine ONEIDA Dawn Total critical care time: 45 minutes Critical care time was exclusive of separately billable procedures and treating other patients. Critical care was necessary to treat or prevent imminent or life-threatening deterioration. Critical care was time spent personally by me on the following activities: development of treatment plan with patient and/or surrogate as well as nursing, discussions with consultants, evaluation of patient's response to treatment, examination of patient, obtaining history from patient or surrogate, ordering and performing  treatments and interventions, ordering and review of laboratory studies, ordering and review of radiographic studies, pulse oximetry and re-evaluation of patient's condition.      Final diagnoses:  None    ED Discharge Orders     None          Dawn Curtistine, MD 11/19/23 2048

## 2023-11-19 NOTE — H&P (Addendum)
 History and Physical  Timothy Marks FMW:[id number] DOB: 07-16-1990 DOA: 11/19/2023  PCP: Paseda, Folashade R, FNP   Chief Complaint: Sickle cell pain crisis  HPI: Timothy Marks is a 33 y.o. male with medical history significant for sickle cell disease, DVT on Eliquis , anxiety and depression, avascular necrosis of the left shoulder, anemia of chronic disease, and chronic pain syndrome who presented to the ED for evaluation of sickle cell pain.  Patient was in his usual state of health until 2 days ago when he started having back pain similar to his sickle cell pain.  He has been taking his home as needed oxycodone  without significant relief.  He also endorsed a cough that is intermittently productive over the last week but denies any chest pain, shortness of breath, fevers, chills, abdominal pain, nausea, vomiting, dizziness or headache.  ED Course: Initial vitals show patient afebrile and normotensive. Initial labs significant for K+ 3.2, bilirubin 3.2, WBC 11.1, Hgb 9.8 reticcount 406, retic percentage 11.9%, and platelet 597. Pt received IV fluid, IV Benadryl  and IV Dilaudid  2 mg x 3 doses. TR[facility] was consulted for admission.   Review of Systems: Please see HPI for pertinent positives and negatives. A complete 10 system review of systems are otherwise negative.  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 His smoking use included cigarettes. He has been exposed to tobacco smoke. He has never used smokeless tobacco. He reports current drug use. Frequency: 7.00 times per week. Drug: Marijuana. He reports that  he does not drink alcohol .  Allergies  Allergen Reactions   Buprenorphine Hcl Hives   Levaquin  [Levofloxacin ] Itching   Meperidine Rash   Morphine  Hives    Takes MS IR at home   Morphine  And Codeine Hives    Takes MSIR PTA   Tramadol Hives   Vancomycin Itching   Zosyn [Piperacillin Sod-Tazobactam So] Itching, Rash and Other (See Comments)    Has taken rocephin  in the past    Family History  Problem Relation Age of Onset   Sickle cell trait Mother    Sickle cell trait Father      Prior to Admission medications   Medication Sig Start Date End Date Taking? Authorizing Provider  apixaban  (ELIQUIS ) 5 MG TABS tablet Take 1 tablet (5 mg total) by mouth 2 (two) times daily. 08/30/23   Paseda, Folashade R, FNP  cetirizine (ZYRTEC) 10 MG tablet Take 10 mg by mouth daily.    [provider]  diphenhydrAMINE  (BENADRYL ) 25 mg capsule Take 1 capsule (25 mg total) by mouth every 6 (six) hours as needed for itching. 01/17/23   Paseda, Folashade R, FNP  ergocalciferol  (VITAMIN D2) 1.25 MG (50000 UT) capsule Take 1 capsule (50,000 Units total) by mouth once a week. 05/16/23     folic acid  (FOLVITE ) 1 MG tablet Take 1 tablet (1 mg total) by mouth daily. 08/30/23   Paseda, Folashade R, FNP  gabapentin  (NEURONTIN ) 300 MG capsule Take 1 capsule (300 mg total) by mouth 3 (three) times daily. 05/03/23   Paseda, Folashade R, FNP  hydroxyurea  (HYDREA ) 500  MG capsule Take 1 capsule (500 mg total) by mouth 2 (two) times daily. May take with food to minimize GI side effects. 06/19/23   Paseda, Folashade R, FNP  ibuprofen  (ADVIL ) 800 MG tablet Take 1 tablet (800 mg total) by mouth every 8 (eight) hours as needed. 08/30/23   Paseda, Folashade R, FNP  naloxone  (NARCAN ) nasal spray 4 mg/0.1 mL Place 1 spray into the nose once. 09/04/22   [provider]  oxyCODONE -acetaminophen  (PERCOCET) 10-325 MG tablet Take 1 tablet by mouth every 4 (four) hours as needed for up to 15 days for pain. 11/16/23 12/01/23   Paseda, Folashade R, FNP    Physical Exam: BP 138/67 (BP Location: Left Arm)   Pulse 83   Temp 98.4 F (36.9 C) (Oral)   Resp 16   SpO2 100%  General: Pleasant, thin appearing young man laying in bed. No acute distress. HEENT: [place]/AT. Anicteric sclera CV: RRR. No murmurs, rubs, or gallops. No LE edema Pulmonary: Lungs CTAB. Normal effort. No wheezing or rales. Abdominal: Soft, nontender, nondistended. Normal bowel sounds. Extremities: Palpable radial and DP pulses. Normal ROM. Skin: Warm and dry. No obvious rash or lesions. Neuro: A&Ox3. Moves all extremities. Normal sensation to light touch. No focal deficit. Psych: Normal mood and affect          Labs on Admission:  Basic Metabolic Panel: Recent Labs  Lab 11/19/23 1850  NA 138  K 3.2*  CL 104  CO2 24  GLUCOSE 85  BUN <5*  CREATININE 0.58*  CALCIUM 9.3   Liver Function Tests: Recent Labs  Lab 11/19/23 1850  AST 27  ALT <5  ALKPHOS 84  BILITOT 3.2*  PROT 7.7  ALBUMIN 4.3   No results for input(s): LIPASE, AMYLASE in the last 168 hours. No results for input(s): AMMONIA in the last 168 hours. CBC: Recent Labs  Lab 11/19/23 1850  WBC 11.1*  NEUTROABS 7.2  HGB 9.8*  HCT 29.6*  MCV 87.1  PLT 597*   Cardiac Enzymes: No results for input(s): CKTOTAL, CKMB, CKMBINDEX, TROPONINI in the last 168 hours. BNP (last 3 results) No results for input(s): BNP in the last 8760 hours.  ProBNP (last 3 results) No results for input(s): PROBNP in the last 8760 hours.  CBG: No results for input(s): GLUCAP in the last 168 hours.  Radiological Exams on Admission: DG CHEST PORT 1 VIEW Result Date: 11/19/2023 CLINICAL DATA:  Cough. EXAM: PORTABLE CHEST 1 VIEW COMPARISON:  09/24/2023 FINDINGS: Low lung volumes. Bronchovascular crowding related to low lung volumes. Mild bronchial thickening. No focal airspace disease. No pleural fluid or pneumothorax. Sclerotic appearance of the osseous structures  consistent with sickle cell. IMPRESSION: Low lung volumes with bronchovascular crowding. Mild bronchial thickening. Electronically Signed   By: Andrea Gasman M.D.   On: 11/19/2023 22:06   Assessment/Plan Timothy Marks is a 33 y.o. male with medical history significant for sickle cell disease, DVT on Eliquis , anxiety and depression, avascular necrosis of the left shoulder, anemia of chronic disease, and chronic pain syndrome who presented to the ED for evaluation of sickle cell pain and admitted for sickle cell pain crisis.  # Sickle cell pain crisis - Presented with 2 days of back pain - Chest imaging shows mild bronchial thickening but no active disease - Patient comfortable-appearing with moderate back tenderness on exam - Observation admission for pain control - Start IV Dilaudid  2 mg Q2H PRN for pain while in the ED - Start weight-based PCA pump on  the floor - IV Toradol  15 mg Q6H for 5 days - IV Benadryl  25 mg PRN for itching - IV NS at 100 cc/h for 1 day - Continue supplemental O2 - Continue home hydroxyurea  and folic acid   # Hypokalemia - K+ slightly low at 3.2, repleted with KCl 40 mEq x 1 - Follow-up morning K+ and mag  # Anemia of chronic disease - Hgb of 9.8 around baseline of 9-11 - Trend CBC  # Chronic pain syndrome - Continue as needed Percocet and gabapentin   # History of DVT - Continue Eliquis   # Vitamin D  deficiency - Continue high-dose vitamin 50,000 units weekly - Follow-up vitamin D  levels   DVT prophylaxis: Eliquis     Code Status: Full Code  Consults called: None  Family Communication: Discussed admission with friend at bedside  Severity of Illness: The appropriate patient status for this patient is OBSERVATION. Observation status is judged to be reasonable and necessary in order to provide the required intensity of service to ensure the patient's safety. The patient's presenting symptoms, physical exam findings, and initial radiographic and  laboratory data in the context of their medical condition is felt to place them at decreased risk for further clinical deterioration. Furthermore, it is anticipated that the patient will be medically stable for discharge from the hospital within 2 midnights of admission.   Level of care: Telemetry    Timothy Claretta HERO, MD 11/19/2023, 10:17 PM [facility] Pager: [phone] Isaiah 41:10   If 7PM-7AM, please contact night-coverage [email or web address] Password [email or web address]

## 2023-11-20 DIAGNOSIS — Z87891 Personal history of nicotine dependence: Secondary | ICD-10-CM | POA: Diagnosis not present

## 2023-11-20 DIAGNOSIS — E559 Vitamin D deficiency, unspecified: Secondary | ICD-10-CM | POA: Diagnosis present

## 2023-11-20 DIAGNOSIS — Z79899 Other long term (current) drug therapy: Secondary | ICD-10-CM | POA: Diagnosis not present

## 2023-11-20 DIAGNOSIS — D72829 Elevated white blood cell count, unspecified: Secondary | ICD-10-CM | POA: Diagnosis present

## 2023-11-20 DIAGNOSIS — Z832 Family history of diseases of the blood and blood-forming organs and certain disorders involving the immune mechanism: Secondary | ICD-10-CM | POA: Diagnosis not present

## 2023-11-20 DIAGNOSIS — Z9049 Acquired absence of other specified parts of digestive tract: Secondary | ICD-10-CM | POA: Diagnosis not present

## 2023-11-20 DIAGNOSIS — Z888 Allergy status to other drugs, medicaments and biological substances status: Secondary | ICD-10-CM | POA: Diagnosis not present

## 2023-11-20 DIAGNOSIS — D5700 Hb-SS disease with crisis, unspecified: Secondary | ICD-10-CM | POA: Diagnosis present

## 2023-11-20 DIAGNOSIS — Z885 Allergy status to narcotic agent status: Secondary | ICD-10-CM | POA: Diagnosis not present

## 2023-11-20 DIAGNOSIS — D638 Anemia in other chronic diseases classified elsewhere: Secondary | ICD-10-CM | POA: Diagnosis present

## 2023-11-20 DIAGNOSIS — Z7901 Long term (current) use of anticoagulants: Secondary | ICD-10-CM | POA: Diagnosis not present

## 2023-11-20 DIAGNOSIS — E876 Hypokalemia: Secondary | ICD-10-CM | POA: Diagnosis present

## 2023-11-20 DIAGNOSIS — Z86718 Personal history of other venous thrombosis and embolism: Secondary | ICD-10-CM | POA: Diagnosis not present

## 2023-11-20 DIAGNOSIS — F419 Anxiety disorder, unspecified: Secondary | ICD-10-CM | POA: Diagnosis present

## 2023-11-20 DIAGNOSIS — G894 Chronic pain syndrome: Secondary | ICD-10-CM | POA: Diagnosis present

## 2023-11-20 DIAGNOSIS — F32A Depression, unspecified: Secondary | ICD-10-CM | POA: Diagnosis present

## 2023-11-20 DIAGNOSIS — R059 Cough, unspecified: Secondary | ICD-10-CM | POA: Diagnosis present

## 2023-11-20 LAB — CBC
HCT: 27.3 % — ABNORMAL LOW (ref 39.0–52.0)
Hemoglobin: 9.3 g/dL — ABNORMAL LOW (ref 13.0–17.0)
MCH: 29 pg (ref 26.0–34.0)
MCHC: 34.1 g/dL (ref 30.0–36.0)
MCV: 85 fL (ref 80.0–100.0)
Platelets: 487 K/uL — ABNORMAL HIGH (ref 150–400)
RBC: 3.21 MIL/uL — ABNORMAL LOW (ref 4.22–5.81)
RDW: 19.7 % — ABNORMAL HIGH (ref 11.5–15.5)
WBC: 17.2 K/uL — ABNORMAL HIGH (ref 4.0–10.5)
nRBC: 2.5 % — ABNORMAL HIGH (ref 0.0–0.2)

## 2023-11-20 LAB — COMPREHENSIVE METABOLIC PANEL WITH GFR
ALT: 49 U/L — ABNORMAL HIGH (ref 0–44)
AST: 124 U/L — ABNORMAL HIGH (ref 15–41)
Albumin: 4.4 g/dL (ref 3.5–5.0)
Alkaline Phosphatase: 115 U/L (ref 38–126)
Anion gap: 12 (ref 5–15)
BUN: <5 mg/dL — ABNORMAL LOW (ref 6–20)
CO2: 26 mmol/L (ref 22–32)
Calcium: 9.1 mg/dL (ref 8.9–10.3)
Chloride: 101 mmol/L (ref 98–111)
Creatinine, Ser: 0.7 mg/dL (ref 0.61–1.24)
GFR, Estimated: >60 mL/min (ref >60)
Glucose, Bld: 109 mg/dL — ABNORMAL HIGH (ref 70–99)
Potassium: 3.3 mmol/L — ABNORMAL LOW (ref 3.5–5.1)
Sodium: 138 mmol/L (ref 135–145)
Total Bilirubin: 8.2 mg/dL — ABNORMAL HIGH (ref 0.0–1.2)
Total Protein: 7.6 g/dL (ref 6.5–8.1)

## 2023-11-20 LAB — MAGNESIUM: Magnesium: 2.1 mg/dL (ref 1.7–2.4)

## 2023-11-20 LAB — VITAMIN D 25 HYDROXY (VIT D DEFICIENCY, FRACTURES): Vit D, 25-Hydroxy: 53.25 ng/mL (ref 30–100)

## 2023-11-20 MED ORDER — POTASSIUM CHLORIDE CRYS ER 20 MEQ PO TBCR
40.0000 meq | EXTENDED_RELEASE_TABLET | Freq: Once | ORAL | Status: AC
  Administered 2023-11-20: 40 meq via ORAL
  Filled 2023-11-20: qty 2

## 2023-11-20 NOTE — ED Notes (Addendum)
 Pt shouting and yelling. Writer entered room and spoke with pt about what was wrong. Pt was complaining of about being in pain. Pt had been given dilaudid  and writer advised around 8am another dose could be administered. Pt kept asking for ketamine . Writer advised it was up to the doctor if that would be administered.   Pt then asked for a drink. Writer mixed orange juice and apple juice for pt. Pt began yelling about how about some ice? 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

## 2023-11-20 NOTE — ED Notes (Signed)
 Pt requesting a shot of ketamine  at this time. Pt advised that providers had been advised of this request previously and denied to put in orders. Pt asked if providers could be asked again.

## 2023-11-20 NOTE — Plan of Care (Signed)
  Problem: Education: Goal: Knowledge of vaso-occlusive preventative measures will improve Outcome: Progressing Goal: Awareness of infection prevention will improve Outcome: Progressing Goal: Awareness of signs and symptoms of anemia will improve Outcome: Progressing Goal: Long-term complications will improve Outcome: Progressing   Problem: Fluid Volume: Goal: Ability to maintain a balanced intake and output will improve Outcome: Progressing   Problem: Sensory: Goal: Pain level will decrease with appropriate interventions Outcome: Progressing   Problem: Health Behavior: Goal: Postive changes in compliance with treatment and prescription regimens will improve Outcome: Progressing   Problem: Health Behavior/Discharge Planning: Goal: Ability to manage health-related needs will improve Outcome: Progressing   Problem: Coping: Goal: Level of anxiety will decrease Outcome: Progressing   Problem: Elimination: Goal: Will not experience complications related to bowel motility Outcome: Progressing   Problem: Pain Managment: Goal: General experience of comfort will improve and/or be controlled Outcome: Progressing   Problem: Safety: Goal: Ability to remain free from injury will improve Outcome: Progressing

## 2023-11-20 NOTE — Progress Notes (Signed)
 Pt is pressing all types of buttons on PCA pump as witnessed by [employer] . I advised pt to stop as this could interfere with settings for dosaging . . . [employer] RN aware .

## 2023-11-20 NOTE — ED Notes (Signed)
 Pt advised new pain in left hamstring/glute started in addition to previous pain continuing

## 2023-11-20 NOTE — Progress Notes (Signed)
 Patient ID: Timothy Marks, male   DOB: [birth year], 33 y.o.   MRN: [id number] Subjective:  Timothy Marks is a 33 y.o. male with history of sickle cell disease, DVT on Eliquis , anxiety and depression, avascular necrosis of the left shoulder, anemia of chronic disease, and chronic pain syndrome who presented to the emergency department for evaluation of sickle cell pain. Patient states he has had low back pain, his home pain medications did not provide any significant relief.  Patient is lying in bed moaning and cursing using inappropriate language. I reiterated that communication will be respectful, clear, proper and understandable words will be used. He rates pain 9/10. With no precipitating factors. Denies subjective fever, N/V/D. No urinary symptoms.  Objective:  Vital signs in last 24 hours:  Vitals:   11/20/23 0606 11/20/23 0718 11/20/23 0803 11/20/23 1015  BP: 103/72  115/70 113/73  Pulse: 70  (!) 59 67  Resp: 17  16 16   Temp: 98 F (36.7 C)  97.9 F (36.6 C)   TempSrc: Oral  Oral   SpO2: 94% 94% 94% 94%    Intake/Output from previous day:  No intake or output data in the 24 hours ending 11/20/23 1054  Physical Exam: General: Alert, awake, oriented x3, in no acute distress.  HEENT: [place]/AT PEERL, EOMI Neck: Trachea midline,  no masses, no thyromegal,y no JVD, no carotid bruit OROPHARYNX:  Moist, No exudate/ erythema/lesions.  Heart: Regular rate and rhythm, without murmurs, rubs, gallops, PMI non-displaced, no heaves or thrills on palpation.  Lungs: Clear to auscultation, no wheezing or rhonchi noted. No increased vocal fremitus resonant to percussion  Abdomen: Soft, nontender, nondistended, positive bowel sounds, no masses no hepatosplenomegaly noted..  Neuro: No focal neurological deficits noted cranial nerves II through XII grossly intact. DTRs 2+ bilaterally upper and lower extremities. Strength 5 out of 5 in bilateral upper and lower extremities. Musculoskeletal:  Generalize body tenderness Psychiatric: Patient alert and oriented x3, good insight and cognition, good recent to remote recall. Lymph node survey: No cervical axillary or inguinal lymphadenopathy noted.  Lab Results:  Basic Metabolic Panel:    Component Value Date/Time   NA 138 11/20/2023 0455   NA 138 08/30/2023 0946   K 3.3 (L) 11/20/2023 0455   CL 101 11/20/2023 0455   CO2 26 11/20/2023 0455   BUN <5 (L) 11/20/2023 0455   BUN 5 (L) 08/30/2023 0946   CREATININE 0.70 11/20/2023 0455   GLUCOSE 109 (H) 11/20/2023 0455   CALCIUM 9.1 11/20/2023 0455   CBC:    Component Value Date/Time   WBC 17.2 (H) 11/20/2023 0455   HGB 9.3 (L) 11/20/2023 0455   HGB 12.0 (L) 08/30/2023 0946   HCT 27.3 (L) 11/20/2023 0455   HCT 39.1 08/30/2023 0946   PLT 487 (H) 11/20/2023 0455   PLT 542 (H) 08/30/2023 0946   MCV 85.0 11/20/2023 0455   MCV 96 08/30/2023 0946   NEUTROABS 7.2 11/19/2023 1850   NEUTROABS 2.5 08/30/2023 0946   LYMPHSABS 2.8 11/19/2023 1850   LYMPHSABS 2.0 08/30/2023 0946   MONOABS 0.8 11/19/2023 1850   EOSABS 0.2 11/19/2023 1850   EOSABS 0.1 08/30/2023 0946   BASOSABS 0.0 11/19/2023 1850   BASOSABS 0.0 08/30/2023 0946    No results found for this or any previous visit (from the past 240 hours).  Studies/Results: DG CHEST PORT 1 VIEW Result Date: 11/19/2023 CLINICAL DATA:  Cough. EXAM: PORTABLE CHEST 1 VIEW COMPARISON:  09/24/2023 FINDINGS: Low lung volumes. Bronchovascular crowding related  to low lung volumes. Mild bronchial thickening. No focal airspace disease. No pleural fluid or pneumothorax. Sclerotic appearance of the osseous structures consistent with sickle cell. IMPRESSION: Low lung volumes with bronchovascular crowding. Mild bronchial thickening. Electronically Signed   By: Andrea Gasman M.D.   On: 11/19/2023 22:06    Medications: Scheduled Meds:  apixaban   5 mg Oral BID   ergocalciferol   50,000 Units Oral Weekly   folic acid   1 mg Oral Daily    HYDROmorphone    Intravenous Q4H   hydroxyurea   500 mg Oral BID   ketorolac   15 mg Intravenous Q6H   senna-docusate  1 tablet Oral BID   Continuous Infusions:  sodium chloride  100 mL/hr at 11/19/23 2228   PRN Meds:.diphenhydrAMINE , HYDROmorphone  (DILAUDID ) injection, naloxone  **AND** sodium chloride  flush, ondansetron , oxyCODONE -acetaminophen  **AND** oxyCODONE , polyethylene glycol  Consultants: None  Procedures: None  Antibiotics: None  Assessment/Plan: Principal Problem:   Sickle cell pain crisis (HCC) Active Problems:   Leukocytosis   Chronic pain syndrome   Anemia of chronic disease   Anxiety   Hb Sickle Cell Disease with Pain crisis: pain not resolving, continue IVF 0.45% Saline KVO, continue weight based Dilaudid  PCA, IV Toradol  15 mg Q 6 H for a total of 5 days, continue oral home pain medications as ordered. Monitor vitals very closely, Re-evaluate pain scale regularly, 2 L of Oxygen  by [place]. Leukocytosis: Elevated, possibly multifactorial.  patient has no s/s of infection. will continue to monitor without antibiotics Anemia of Chronic Disease: Hgb at patients baseline. Will continue to monitor daily cbc.  Chronic pain Syndrome: continue oral home pain medication Anxiety: Stable at this time.  Code Status: Full Code Family Communication: N/A Disposition Plan: Not yet ready for discharge  Homer CHRISTELLA Cover NP   If 7PM-7AM, please contact night-coverage.  11/20/2023, 10:54 AM  LOS: 0 days

## 2023-11-21 LAB — BASIC METABOLIC PANEL WITH GFR
Anion gap: 9 (ref 5–15)
BUN: 9 mg/dL (ref 6–20)
CO2: 27 mmol/L (ref 22–32)
Calcium: 8.7 mg/dL — ABNORMAL LOW (ref 8.9–10.3)
Chloride: 104 mmol/L (ref 98–111)
Creatinine, Ser: 0.66 mg/dL (ref 0.61–1.24)
GFR, Estimated: >60 mL/min (ref >60)
Glucose, Bld: 86 mg/dL (ref 70–99)
Potassium: 4 mmol/L (ref 3.5–5.1)
Sodium: 140 mmol/L (ref 135–145)

## 2023-11-21 NOTE — Progress Notes (Addendum)
 Patient refused telemetry as MD order. RN educated, patient verbally understand the reason and states that  I refuse it, do not place anything on my body and cuss out loudly. MD on call notified.

## 2023-11-21 NOTE — Plan of Care (Signed)
   Problem: Activity: Goal: Risk for activity intolerance will decrease Outcome: Progressing   Problem: Nutrition: Goal: Adequate nutrition will be maintained Outcome: Progressing   Problem: Safety: Goal: Ability to remain free from injury will improve Outcome: Progressing   Problem: Skin Integrity: Goal: Risk for impaired skin integrity will decrease Outcome: Progressing

## 2023-11-21 NOTE — Progress Notes (Signed)
 Pt threatening to take out iv and leave against medical advice . SABRA SABRA [place] Nurse Debby aware .

## 2023-11-21 NOTE — Progress Notes (Addendum)
 Patient was rude to Rns when we were giving bedside shift report. Patient did not want nurses come to his room when it was time to give a report. Patient even refused assessment and cussed out. Assigned nurse went to his room with another nurse and offered to check on him and PCA to make sure it functioned properly. Patient raised his voice and asked us  get out of his room. Patient cussed out loudly and disrespectful to staff and became aggressive with inappropriate behaviors. Leadership and AC was notified.

## 2023-11-21 NOTE — Progress Notes (Addendum)
 Due to the sexual commentary made by this pt throughout this entire shift, I refuse to care for pt again as I am extremely uncomfortable . Essence (NT) and Clotilda (RN) are both a witness to this . [employer] RN is also aware of tonights events . Timothy Marks

## 2023-11-21 NOTE — Progress Notes (Signed)
 Patient ID: Timothy Marks, male   DOB: 07/23/1990, 33 y.o.   MRN: [id number] Subjective: Timothy Marks is a 33 y.o. male with history of sickle cell disease, DVT on Eliquis , anxiety and depression, avascular necrosis of the left shoulder, anemia of chronic disease, and chronic pain syndrome who presented to the emergency department for evaluation of sickle cell pain. Patient states he has had low back pain, his home pain medications did not provide any significant relief.   Patient continues to endorse pain of 7/10 this morning slightly better than yesterday, he has no new concerns.  Denies subjective fever, N/V/D. No urinary symptoms.    Objective:  Vital signs in last 24 hours:  Vitals:   11/21/23 0059 11/21/23 0400 11/21/23 0406 11/21/23 0821  BP: 112/74  113/71   Pulse: 73  69   Resp: 18 18 17 16   Temp: 98 F (36.7 C)  98.4 F (36.9 C)   TempSrc: Oral  Oral   SpO2: 96% 97% 100% 99%    Intake/Output from previous day:   Intake/Output Summary (Last 24 hours) at 11/21/2023 0940 Last data filed at 11/20/2023 1800 Gross per 24 hour  Intake 2534.19 ml  Output --  Net 2534.19 ml    Physical Exam: General: Alert, awake, oriented x3, in no acute distress.  HEENT: [place]/AT PEERL, EOMI Neck: Trachea midline,  no masses, no thyromegal,y no JVD, no carotid bruit OROPHARYNX:  Moist, No exudate/ erythema/lesions.  Heart: Regular rate and rhythm, without murmurs, rubs, gallops, PMI non-displaced, no heaves or thrills on palpation.  Lungs: Clear to auscultation, no wheezing or rhonchi noted. No increased vocal fremitus resonant to percussion  Abdomen: Soft, nontender, nondistended, positive bowel sounds, no masses no hepatosplenomegaly noted..  Neuro: No focal neurological deficits noted cranial nerves II through XII grossly intact. DTRs 2+ bilaterally upper and lower extremities. Strength 5 out of 5 in bilateral upper and lower extremities. Musculoskeletal: Generalize body  tenderness Psychiatric: Patient alert and oriented x3, good insight and cognition, good recent to remote recall. Lymph node survey: No cervical axillary or inguinal lymphadenopathy noted.  Lab Results:  Basic Metabolic Panel:    Component Value Date/Time   NA 138 11/20/2023 0455   NA 138 08/30/2023 0946   K 3.3 (L) 11/20/2023 0455   CL 101 11/20/2023 0455   CO2 26 11/20/2023 0455   BUN <5 (L) 11/20/2023 0455   BUN 5 (L) 08/30/2023 0946   CREATININE 0.70 11/20/2023 0455   GLUCOSE 109 (H) 11/20/2023 0455   CALCIUM 9.1 11/20/2023 0455   CBC:    Component Value Date/Time   WBC 17.2 (H) 11/20/2023 0455   HGB 9.3 (L) 11/20/2023 0455   HGB 12.0 (L) 08/30/2023 0946   HCT 27.3 (L) 11/20/2023 0455   HCT 39.1 08/30/2023 0946   PLT 487 (H) 11/20/2023 0455   PLT 542 (H) 08/30/2023 0946   MCV 85.0 11/20/2023 0455   MCV 96 08/30/2023 0946   NEUTROABS 7.2 11/19/2023 1850   NEUTROABS 2.5 08/30/2023 0946   LYMPHSABS 2.8 11/19/2023 1850   LYMPHSABS 2.0 08/30/2023 0946   MONOABS 0.8 11/19/2023 1850   EOSABS 0.2 11/19/2023 1850   EOSABS 0.1 08/30/2023 0946   BASOSABS 0.0 11/19/2023 1850   BASOSABS 0.0 08/30/2023 0946    No results found for this or any previous visit (from the past 240 hours).  Studies/Results: DG CHEST PORT 1 VIEW Result Date: 11/19/2023 CLINICAL DATA:  Cough. EXAM: PORTABLE CHEST 1 VIEW COMPARISON:  09/24/2023 FINDINGS:  Low lung volumes. Bronchovascular crowding related to low lung volumes. Mild bronchial thickening. No focal airspace disease. No pleural fluid or pneumothorax. Sclerotic appearance of the osseous structures consistent with sickle cell. IMPRESSION: Low lung volumes with bronchovascular crowding. Mild bronchial thickening. Electronically Signed   By: Andrea Gasman M.D.   On: 11/19/2023 22:06    Medications: Scheduled Meds:  apixaban   5 mg Oral BID   folic acid   1 mg Oral Daily   HYDROmorphone    Intravenous Q4H   hydroxyurea   500 mg Oral BID    ketorolac   15 mg Intravenous Q6H   senna-docusate  1 tablet Oral BID   Vitamin D  (Ergocalciferol )  50,000 Units Oral Weekly   Continuous Infusions: PRN Meds:.diphenhydrAMINE , naloxone  **AND** sodium chloride  flush, ondansetron , oxyCODONE -acetaminophen  **AND** oxyCODONE , polyethylene glycol  Consultants: None  Procedures: None  Antibiotics: None  Assessment/Plan: Principal Problem:   Sickle cell pain crisis (HCC) Active Problems:   Leukocytosis   Chronic pain syndrome   Anemia of chronic disease   Anxiety   Hb Sickle Cell Disease with Pain crisis: Pain is gradually improving, IVF 0.45% saline @ KVO, continue Dilaudid  PCA on current dose, IV Toradol  15 mg Q 6 H for a total of 5 days, continue oral home pain medications as ordered. Monitor vitals very closely, Re-evaluate pain scale regularly, 2 L of Oxygen  by [place]. Patient encouraged to ambulate on the hallway today.  Leukocytosis:Elevated, possibly multifactorial. patient has no s/s of infection. will continue to monitor without antibiotics.   Anemia of Chronic Disease: Hgb at patients baseline. Will continue to monitor daily cbc.  Chronic pain Syndrome: continue oral home pain medication  Anxiety: Stable at this time.     Code Status: Full Code Family Communication: N/A Disposition Plan: Not yet ready for discharge  Homer CHRISTELLA Cover NP   If 7PM-7AM, please contact night-coverage.  11/21/2023, 9:40 AM  LOS: 1 day

## 2023-11-21 NOTE — Progress Notes (Signed)
 Pt refusing telemetry at this time, Will try again later . Timothy Marks

## 2023-11-21 NOTE — Progress Notes (Signed)
   11/21/23 1036  TOC Brief Assessment  Insurance and Status Reviewed  Patient has primary care physician Yes  Home environment has been reviewed resides in an apartment  Prior level of function: Independent  Prior/Current Home Services No current home services  Social Drivers of Health Review SDOH reviewed no interventions necessary  Readmission risk has been reviewed Yes  Transition of care needs no transition of care needs at this time

## 2023-11-21 NOTE — Progress Notes (Signed)
 Patient refusing telemetry, will continue to request.

## 2023-11-21 NOTE — Progress Notes (Signed)
 Pt yelling into halls which is disturbing other pt's sleep , Door closed to minimize noise . Witnessed by Essence NT . Charge Nurse Debby aware . SABRA SABRA

## 2023-11-22 ENCOUNTER — Telehealth: Payer: Self-pay

## 2023-11-22 ENCOUNTER — Other Ambulatory Visit (HOSPITAL_COMMUNITY): Payer: Self-pay

## 2023-11-22 LAB — CBC
HCT: 21.7 % — ABNORMAL LOW (ref 39.0–52.0)
Hemoglobin: 7.4 g/dL — ABNORMAL LOW (ref 13.0–17.0)
MCH: 29.4 pg (ref 26.0–34.0)
MCHC: 34.1 g/dL (ref 30.0–36.0)
MCV: 86.1 fL (ref 80.0–100.0)
Platelets: 548 K/uL — ABNORMAL HIGH (ref 150–400)
RBC: 2.52 MIL/uL — ABNORMAL LOW (ref 4.22–5.81)
RDW: 23.5 % — ABNORMAL HIGH (ref 11.5–15.5)
WBC: 13.4 K/uL — ABNORMAL HIGH (ref 4.0–10.5)
nRBC: 3.2 % — ABNORMAL HIGH (ref 0.0–0.2)

## 2023-11-22 NOTE — Telephone Encounter (Signed)
 Copied from CRM #[id number]. Topic: Clinical - Medical Advice >> Nov 22, 2023  8:00 AM [name] wrote: Reason for CRM: The patient would like a call back from the nurse asap he is currently in the hospital and is having issues with the staff   Pt was spoke to by office manager of the sickle cell clinic. Timothy Marks

## 2023-11-22 NOTE — Progress Notes (Addendum)
 Patient verbally abuse with profanity words and aggressive behaviors to this Clinical research associate when this Clinical research associate came to his room to give his ordered medication. RN Vola came with this Clinical research associate and witness it as well. AC and security were called. NP on call was notified.

## 2023-11-22 NOTE — Progress Notes (Addendum)
       Overnight   NAME: Timothy Marks MRN: [id number] DOB : 12/09/1990    Date of Service   11/22/2023   HPI/Events of Note    Notified by [facility], RN, floor charge RN for presence of security/[employer] at room with patient due to reported manipulation of medical devices, medication delivery systems and abusive/profane demeanor towards staff.  Brief history: 33 year old male history of sickle cell disease, DVT on Eliquis , anxiety and depression, avascular necrosis of the left shoulder, anemia of chronic disease and chronic pain syndrome.  Admitted through ER for evaluation of sickle cell pain.   Bedside visit: At arrival to bedside AC/charge RN/assigned RN/security with [employer], are present. Patient is awake and oriented x 4.   As reported to floor coverage provider: Patient has been seen several times during the shift manipulating the IV pumps, prolonged use of profanity and defiance towards staff with any attempts of care, also has been reportedly exposing and fondling self in presence of staff.   Was also reportedly spoken to by Unit Director during the day for abusive language and self fondling in presence of staff.  Refuses to allow staff to adjust medical devices and provide care. Refuses Tele-monitoring, ETCo2 monitoring.   Both AC and floor coverage NP spoke with patient in reference to staff concerns, and attempted to answer patient concerns. Patient was defiant in the presence of staff RNs, [facility] [employer], [facility], and NP.  Patient is advised that manipulation of IV devices, PCA, and monitoring devices can be dangerous and patient is advised against this, patient became verbally aggressive with this advisement.  Patient was allowed to make his comments and was asked if any further questions said to which he replied no. leave my room and staff, [facility] [employer], NP, and [facility] obliged this demand.  Currently upon departure from room patient is on monitoring, IV fluid infusing, PCA is  active and was used, and was delivering a dose, by patient in our presence.  Patient is advised by [facility] to use his call bell for any assistance required.   Interventions/ Plan   Continue all previous orders by attending. TeleSitter is ordered. Staff will go into room using a buddy system.       Update 0615 hrs. Nursing staff notified AC/NP again for patient refusal to allow TeleSitter device, he is also adamant that Attending has specified that  he may silence his pumps anytime. Patient has verbally contracted to not manipulate devices until speaking with attending.  Patient is again verbally abusive to Nursing staff and [facility].  TeleSitter device will remain available until such time.   Lynwood Kipper BSN MSNA MSN ACNPC-AG Acute Care Nurse Practitioner [facility]

## 2023-11-22 NOTE — Progress Notes (Signed)
 Pt refusing tele sitter at this time. SABRA SABRA [place] chat sent to Lynwood Blondie BEAL (NP) to round at bedside once more .

## 2023-11-22 NOTE — Progress Notes (Signed)
 Patient cursing at staff members after multiple attempts to deescalate the situation patient would not cooperate. AC Merrianne RN), charge nurse Royden RN)  directors [facility] RN, Chief Executive Officer) and NP [place]) made aware of the situationa as well as patient was disrupting other patients. NP arrived at bedside when patient was being verbally abusive, after various attempts of NP trying to talk to patient he would not cooperate with NP.   Security was called   This Clinical research associate attempted to remove IV, patient stated do not touch me I can take this IV myself patient would not allow this writer to get close to him to remove IV.   Patient removed IV   This writer attempted to give patient DC paperwork to which patient ignored, refused and continue to walk, with security walking behind him.

## 2023-11-22 NOTE — Telephone Encounter (Unsigned)
 Copied from CRM #[id number]. Topic: General - Other >> Nov 22, 2023  9:46 AM Travis F wrote: Reason for CRM: Patient is calling in because he says he no longer wants the lady with the braids apart of his care. Patient says he is coming into the office to speak with the office face to face about his recent experience. He says he is being discharged because he was trying to speak his mind. I wanted to make the office aware that patient was coming in to speak about this matter.

## 2023-11-22 NOTE — Progress Notes (Signed)
 PCA alarmed frequently as patient refused to place an CO2 monitoring to his nose during the shift and verbally stated that he did not want RN to check PCA or making round during the shift. RN educated patient about CO2 monitoring and the reason why we had to check his vital sign and check PCA when it is alarmed. Patient did not cooperate with care and did not appreciate the patient care.Patient was rude and using inappropriate words with aggressive behavior consistently. He states  you get out of my room, do not come and do anything, I do not need anything and do not care if PCA work or not. NT witnessed it as she walked to the room with assigned RN.
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
## 2023-11-22 NOTE — Progress Notes (Signed)
 Patient verbalized to his nurse that he did not want visitors in his room. Front desk notified at [phone].

## 2023-11-22 NOTE — Plan of Care (Signed)
  Problem: Health Behavior/Discharge Planning: Goal: Ability to manage health-related needs will improve Outcome: Progressing   Problem: Clinical Measurements: Goal: Ability to maintain clinical measurements within normal limits will improve Outcome: Progressing Goal: Diagnostic test results will improve Outcome: Progressing   Problem: Activity: Goal: Risk for activity intolerance will decrease Outcome: Progressing   Problem: Nutrition: Goal: Adequate nutrition will be maintained Outcome: Progressing   Problem: Pain Managment: Goal: General experience of comfort will improve and/or be controlled Outcome: Progressing
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
## 2023-11-22 NOTE — Discharge Summary (Signed)
 Erroneous encounter

## 2023-11-23 ENCOUNTER — Telehealth: Payer: Self-pay

## 2023-11-23 NOTE — Transitions of Care (Post Inpatient/ED Visit) (Signed)
   11/23/2023  Name: Timothy Marks MRN: [id number] DOB: 1990/05/10  Today's TOC FU Call Status: Today's TOC FU Call Status:: Unsuccessful Call (1st Attempt) Unsuccessful Call (1st Attempt) Date: 11/23/23  Attempted to reach the patient regarding the most recent Inpatient/ED visit.  Follow Up Plan: Additional outreach attempts will be made to reach the patient to complete the Transitions of Care (Post Inpatient/ED visit) call.   Arvin Seip RN, BSN, CCM [employer], Population Health Case Manager Phone: [phone]

## 2023-11-24 ENCOUNTER — Telehealth: Payer: Self-pay | Admitting: *Deleted

## 2023-11-24 ENCOUNTER — Other Ambulatory Visit: Payer: Self-pay | Admitting: Nurse Practitioner

## 2023-11-24 ENCOUNTER — Other Ambulatory Visit (HOSPITAL_COMMUNITY): Payer: Self-pay

## 2023-11-24 DIAGNOSIS — D571 Sickle-cell disease without crisis: Secondary | ICD-10-CM

## 2023-11-24 DIAGNOSIS — G894 Chronic pain syndrome: Secondary | ICD-10-CM

## 2023-11-24 NOTE — Transitions of Care (Post Inpatient/ED Visit) (Signed)
   11/24/2023  Name: Timothy Marks MRN: [id number] DOB: 03-16-1990  Today's TOC FU Call Status: Today's TOC FU Call Status:: Unsuccessful Call (2nd Attempt) Unsuccessful Call (2nd Attempt) Date: 11/24/23  Attempted to reach the patient regarding the most recent Inpatient/ED visit.  Follow Up Plan: Additional outreach attempts will be made to reach the patient to complete the Transitions of Care (Post Inpatient/ED visit) call.   Mliss Creed [facility], BSN RN Care Manager/ Transition of Care [place]/ [facility] [phone]

## 2023-11-27 ENCOUNTER — Other Ambulatory Visit (HOSPITAL_COMMUNITY): Payer: Self-pay

## 2023-11-27 ENCOUNTER — Telehealth: Payer: Self-pay

## 2023-11-27 DIAGNOSIS — D5700 Hb-SS disease with crisis, unspecified: Secondary | ICD-10-CM | POA: Diagnosis not present

## 2023-11-27 DIAGNOSIS — Z7901 Long term (current) use of anticoagulants: Secondary | ICD-10-CM | POA: Diagnosis not present

## 2023-11-27 MED ORDER — OXYCODONE-ACETAMINOPHEN 10-325 MG PO TABS
1.0000 | ORAL_TABLET | ORAL | 0 refills | Status: DC | PRN
  Filled 2023-12-01: qty 90, 15d supply, fill #0

## 2023-11-27 NOTE — Transitions of Care (Post Inpatient/ED Visit) (Signed)
   11/27/2023  Name: Timothy Marks MRN: [id number] DOB: [birth year]  Today's TOC FU Call Status: Today's TOC FU Call Status:: Unsuccessful Call (3rd Attempt) Unsuccessful Call (3rd Attempt) Date: 11/27/23  Attempted to reach the patient regarding the most recent Inpatient/ED visit.  Follow Up Plan: No further outreach attempts will be made at this time. We have been unable to contact the patient.  Shona Prow RN, CCM [place]  [facility]-Population Health RN Care Manager [phone]

## 2023-11-27 NOTE — Telephone Encounter (Signed)
 Reviewed PDMP substance reporting system prior to prescribing opiate medications. No inconsistencies noted.   1. Sickle cell disease without crisis (HCC)  - oxyCODONE-acetaminophen (PERCOCET) 10-325 MG tablet; Take 1 tablet by mouth every 4 (four) hours as needed for up to 15 days for pain.  Dispense: 90 tablet; Refill: 0  2. Chronic pain syndrome  - oxyCODONE-acetaminophen (PERCOCET) 10-325 MG tablet; Take 1 tablet by mouth every 4 (four) hours as needed for up to 15 days for pain.  Dispense: 90 tablet; Refill: 0

## 2023-12-01 ENCOUNTER — Other Ambulatory Visit (HOSPITAL_COMMUNITY): Payer: Self-pay

## 2023-12-01 ENCOUNTER — Other Ambulatory Visit: Payer: Self-pay

## 2023-12-04 ENCOUNTER — Ambulatory Visit: Payer: Self-pay | Admitting: Nurse Practitioner

## 2023-12-04 ENCOUNTER — Encounter: Payer: Self-pay | Admitting: Nurse Practitioner

## 2023-12-04 VITALS — BP 123/65 | HR 66 | Temp 96.8°F | Wt 125.0 lb

## 2023-12-04 DIAGNOSIS — Z23 Encounter for immunization: Secondary | ICD-10-CM

## 2023-12-04 DIAGNOSIS — Z13220 Encounter for screening for lipoid disorders: Secondary | ICD-10-CM | POA: Diagnosis not present

## 2023-12-04 DIAGNOSIS — Z86718 Personal history of other venous thrombosis and embolism: Secondary | ICD-10-CM | POA: Diagnosis not present

## 2023-12-04 DIAGNOSIS — Z136 Encounter for screening for cardiovascular disorders: Secondary | ICD-10-CM

## 2023-12-04 DIAGNOSIS — Z0000 Encounter for general adult medical examination without abnormal findings: Secondary | ICD-10-CM | POA: Insufficient documentation

## 2023-12-04 DIAGNOSIS — D571 Sickle-cell disease without crisis: Secondary | ICD-10-CM

## 2023-12-04 DIAGNOSIS — G894 Chronic pain syndrome: Secondary | ICD-10-CM | POA: Diagnosis not present

## 2023-12-04 DIAGNOSIS — H6121 Impacted cerumen, right ear: Secondary | ICD-10-CM | POA: Insufficient documentation

## 2023-12-04 DIAGNOSIS — E559 Vitamin D deficiency, unspecified: Secondary | ICD-10-CM

## 2023-12-04 LAB — POCT URINALYSIS DIP (CLINITEK)
Bilirubin, UA: NEGATIVE
Glucose, UA: NEGATIVE mg/dL
Ketones, POC UA: NEGATIVE mg/dL
Leukocytes, UA: NEGATIVE
Nitrite, UA: NEGATIVE
POC PROTEIN,UA: NEGATIVE
Spec Grav, UA: 1.015 (ref 1.010–1.025)
Urobilinogen, UA: 2 U/dL — AB
pH, UA: 7 (ref 5.0–8.0)

## 2023-12-04 NOTE — Patient Instructions (Signed)
 OK to use Debrox (peroxide) in the ear to loosen up wax.. Do not use Q-tips as this can impact wax further.    1. Routine medical exam (Primary)   2. Need for influenza vaccination  - Flu vaccine trivalent PF, 6mos and older(Flulaval,Afluria,Fluarix,Fluzone)  3. History of DVT (deep vein thrombosis)   4. Sickle cell disease without crisis (HCC)  - Ferritin - CBC with Differential/Platelet - ToxAssure Flex 15, Ur  5. Chronic pain syndrome  - ToxAssure Flex 15, Ur    It is important that you exercise regularly at least 30 minutes 5 times a week as tolerated  Think about what you will eat, plan ahead. Choose  clean, green, fresh or frozen over canned, processed or packaged foods which are more sugary, salty and fatty. 70 to 75% of food eaten should be vegetables and fruit. Three meals at set times with snacks allowed between meals, but they must be fruit or vegetables. Aim to eat over a 12 hour period , example 7 am to 7 pm, and STOP after  your last meal of the day. Drink water,generally about 64 ounces per day, no other drink is as healthy. Fruit juice is best enjoyed in a healthy way, by EATING the fruit.  Thanks for choosing [facility] we consider it a privelige to serve you.

## 2023-12-04 NOTE — Assessment & Plan Note (Signed)
 Continue Percocet 10 325 mg , 1 tablet every 4 hours as needed moderate to severe pain, ibuprofen  800 mg 3 times daily as needed mild pain

## 2023-12-04 NOTE — Progress Notes (Addendum)
 Complete physical exam  Patient: Timothy Marks   DOB: Feb 29, 1990   33 y.o. Male  MRN: [id number]  Subjective:    Chief Complaint  Patient presents with   Annual Exam    Not fasting     Discussed the use of AI scribe software for clinical note transcription with the patient, who gave verbal consent to proceed.  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  who presents for an annual physical exam.  He experiences sickle cell related pain primarily in his lower back, rating it as a six out of ten. He last took Percocet 10/325 mg last night and uses it as needed, one tablet every four hours for moderate to severe pain. Additionally, he takes ibuprofen  800 mg every eight hours as needed for mild pain.  He is currently taking folic acid  1 mg daily, vitamin D  50,000 units once a week, and Eliquis  5 mg twice a day consistently. He also takes hydroxyurea  500 mg twice daily regularly.  He engages in light physical activity, such as playing video games and taking short walks. He does not follow a specific diet at home. He reports adequate sleep, getting seven to eight hours nightly.  He lives with his mother and has two children, aged nearly two and fourteen. He is single. He does not smoke, drink alcohol , or vape, but he uses marijuana occasionally.  No fever, chest pain, shortness of breath, abdominal pain, nausea, vomiting,    Handicap placard application completed today    Assessment & Plan       Most recent fall risk assessment:    05/04/2023    9:34 AM  Fall Risk   Falls in the past year? 0  Risk for fall due to : No Fall Risks  Follow up Falls evaluation completed     Most  recent depression screenings:    12/04/2023    8:21 AM 08/30/2023    9:24 AM  PHQ 2/9 Scores  PHQ - 2 Score 0 0  PHQ- 9 Score 0 0        Patient Care Team: Jessah Danser R, FNP as PCP - General (Nurse Practitioner)   Outpatient Medications Prior to Visit  Medication Sig   apixaban  (ELIQUIS ) 5 MG TABS tablet Take 1 tablet (5 mg total) by mouth 2 (two) times daily.   cetirizine (ZYRTEC) 10 MG tablet Take 10 mg by mouth daily.   diphenhydrAMINE  (BENADRYL ) 25 mg capsule Take 1 capsule (25 mg total) by mouth every 6 (six) hours as needed for itching.   ergocalciferol  (VITAMIN D2) 1.25 MG (50000 UT) capsule Take 1 capsule (50,000 Units total) by mouth once a week.   folic acid  (FOLVITE ) 1 MG tablet Take 1 tablet (1 mg total) by mouth daily.   hydroxyurea  (HYDREA ) 500 MG capsule Take 1 capsule (500 mg total) by mouth 2 (two) times daily. May take with food to minimize GI side effects.   ibuprofen  (ADVIL ) 800 MG tablet Take 1 tablet (800 mg total) by mouth every 8 (eight) hours as needed.   naloxone  (NARCAN ) nasal spray 4 mg/0.1 mL Place 1 spray into the nose once.   oxyCODONE -acetaminophen  (PERCOCET) 10-325 MG tablet Take 1 tablet by mouth every 4 (four) hours  as needed for up to 15 days for pain.   No facility-administered medications prior to visit.    Review of Systems  Constitutional:  Negative for appetite change, chills, fatigue and fever.  HENT:  Negative for congestion, postnasal drip, rhinorrhea and sneezing.   Eyes:  Negative for pain, discharge and itching.  Respiratory:  Negative for cough, shortness of breath and wheezing.   Cardiovascular:  Negative for chest pain, palpitations and leg swelling.  Gastrointestinal:  Negative for abdominal pain, constipation, nausea and vomiting.  Endocrine: Negative for cold intolerance, heat intolerance and polydipsia.  Genitourinary:  Negative for difficulty urinating, dysuria, flank pain and frequency.  Musculoskeletal:  Positive  for back pain. Negative for arthralgias, joint swelling and myalgias.  Skin:  Negative for color change, pallor, rash and wound.  Neurological:  Negative for dizziness, facial asymmetry, weakness, numbness and headaches.  Psychiatric/Behavioral:  Negative for behavioral problems, confusion, self-injury and suicidal ideas.        Objective:     BP 123/65   Pulse 66   Temp (!) 96.8 F (36 C)   Wt 125 lb (56.7 kg)   SpO2 100%   BMI 16.05 kg/m    Physical Exam Vitals and nursing note reviewed.  Constitutional:      General: He is not in acute distress.    Appearance: Normal appearance. He is not ill-appearing, toxic-appearing or diaphoretic.  HENT:     Right Ear: Ear canal and external ear normal. There is impacted cerumen.     Left Ear: Tympanic membrane, ear canal and external ear normal. There is no impacted cerumen.     Nose: No congestion or rhinorrhea.     Mouth/Throat:     Mouth: Mucous membranes are moist.     Pharynx: Oropharynx is clear. No oropharyngeal exudate or posterior oropharyngeal erythema.  Eyes:     General: No scleral icterus.       Right eye: No discharge.        Left eye: No discharge.     Extraocular Movements: Extraocular movements intact.     Conjunctiva/sclera: Conjunctivae normal.  Neck:     Vascular: No carotid bruit.  Cardiovascular:     Rate and Rhythm: Normal rate and regular rhythm.     Pulses: Normal pulses.     Heart sounds: Normal heart sounds. No murmur heard.    No friction rub. No gallop.  Pulmonary:     Effort: Pulmonary effort is normal. No respiratory distress.     Breath sounds: Normal breath sounds. No stridor. No wheezing, rhonchi or rales.  Chest:     Chest wall: No tenderness.  Abdominal:     General: There is no distension.     Palpations: Abdomen is soft.     Tenderness: There is no abdominal tenderness. There is no right CVA tenderness, left CVA tenderness or guarding.  Musculoskeletal:        General: No swelling,  tenderness, deformity or signs of injury. Normal range of motion.     Cervical back: Normal range of motion and neck supple. No rigidity or tenderness.     Right lower leg: No edema.     Left lower leg: No edema.  Lymphadenopathy:     Cervical: No cervical adenopathy.  Skin:    General: Skin is warm and dry.     Capillary Refill: Capillary refill takes less than 2 seconds.     Coloration: Skin is not jaundiced or pale.  Findings: No bruising, erythema or lesion.  Neurological:     Mental Status: He is alert and oriented to person, place, and time.     Motor: No weakness.     Coordination: Coordination normal.     Gait: Gait normal.  Psychiatric:        Mood and Affect: Mood normal.        Behavior: Behavior normal.        Thought Content: Thought content normal.        Judgment: Judgment normal.     Results for orders placed or performed in visit on 12/04/23  POCT URINALYSIS DIP (CLINITEK)  Result Value Ref Range   Color, UA yellow yellow   Clarity, UA clear clear   Glucose, UA negative negative mg/dL   Bilirubin, UA negative negative   Ketones, POC UA negative negative mg/dL   Spec Grav, UA 8.984 8.989 - 1.025   Blood, UA trace-intact (A) negative   pH, UA 7.0 5.0 - 8.0   POC PROTEIN,UA negative negative, trace   Urobilinogen, UA 2.0 (A) 0.2 or 1.0 E.U./dL   Nitrite, UA Negative Negative   Leukocytes, UA Negative Negative       Assessment & Plan:    Routine Health Maintenance and Physical Exam  Immunization History  Administered Date(s) Administered   DTaP 12/08/1988, 02/02/1989, 04/03/1989, 03/19/1990, 07/09/1993   H1N1 04/03/2008   HIB (PRP-OMP) 04/03/1989, 05/29/1989, 07/24/1989, 01/06/1990, 12/20/2007   HIB (PRP-T) 12/20/2007   Hepatitis A 10/05/1999   Hepatitis A, Ped/Adol-2 Dose 10/05/1999   Hepatitis B 07/28/1994, 09/02/1994, 08/19/1999   Hepatitis B, PED/ADOLESCENT 07/28/1994, 09/02/1994, 08/19/1999   IPV 12/08/1988, 02/02/1989, 03/19/1990,  07/09/1993   Influenza Split 01/20/2016, 11/30/2017   Influenza, Seasonal, Injecte, Preservative Fre 11/01/2022, 12/04/2023   Influenza,inj,Quad PF,6+ Mos 03/04/2013, 04/01/2014, 01/20/2016, 11/30/2017, 02/27/2020, 04/22/2021   Influenza-Unspecified 10/16/2014   MMR 01/06/1990, 07/09/1993   Meningococcal Conjugate 12/20/2007, 01/20/2016   Novel Infuenza-h1n1-09 04/03/2008   Pneumococcal Conjugate-13 10/07/1991, 01/14/2015   Pneumococcal Polysaccharide-23 09/18/2008, 03/04/2013   Pneumococcal-Unspecified 01/14/2015   Tdap 11/30/2017    Health Maintenance  Topic Date Due   COVID-19 Vaccine (1) Never done   Meningococcal B Vaccine (1 of 4 - Increased Risk) Never done   HPV VACCINES (1 - 3-dose SCDM series) Never done   Medicare Annual Wellness (AWV)  05/03/2024   DTaP/Tdap/Td (7 - Td or Tdap) 12/01/2027   Pneumococcal Vaccine (4 of 4 - PCV20 or PCV21) 10/07/2038   Influenza Vaccine  Completed   Hepatitis B Vaccines 19-59 Average Risk  Completed   Hepatitis C Screening  Completed   HIV Screening  Completed    Discussed health benefits of physical activity, and encouraged him to engage in regular exercise appropriate for his age and condition.  Problem List Items Addressed This Visit       Nervous and Auditory   Impacted cerumen, right ear    Cerumen impaction in the right ear without hearing difficulties. - Recommend Debrox for cerumen removal.        Other   Sickle cell anemia (HCC) (Chronic)   Sickle cell disease with chronic pain syndrome Chronic pain due to sickle cell disease, primarily in the lower back. - Continue Percocet 10-325 mg every 4 hours as needed for moderate to severe pain. - Continue ibuprofen  800 mg every 8 hours as needed for mild pain. Continue folic acid  1 mg daily, hydroxyurea  500 mg twice daily - Avoid triggers for sickle cell crisis such as extreme temperatures,  stress, and infections. - Seek urgent medical attention if signs of crisis occur. -  Store pain medication safely. - Manage constipation with hydration, vegetables, and consider Miralax  if needed. - Ensure annual eye examination. Flu vaccine given in the office today Urinalysis negative for protein       Relevant Orders   Ferritin   CBC with Differential/Platelet   ToxAssure Flex 15, Ur   POCT URINALYSIS DIP (CLINITEK) (Completed)   Vitamin D  deficiency (Chronic)   Continue vitamin D  50,000 units once weekly Last vitamin D  Lab Results  Component Value Date   VD25OH 53.25 11/20/2023         Chronic pain syndrome   Continue Percocet 10 325 mg , 1 tablet every 4 hours as needed moderate to severe pain, ibuprofen  800 mg 3 times daily as needed mild pain      Relevant Orders   ToxAssure Flex 15, Ur   History of DVT (deep vein thrombosis)   Continues Eliquis  5 mg twice daily      Annual physical exam - Primary   Routine annual physical examination. - Encourage moderate exercise 30 minutes, five days a week. - Recommend heart-healthy, low-salt, low-fat diet. - Advise hydration with at least 64 ounces of water daily. - Recommend 7-8 hours of sleep nightly. - Advise use of seatbelts. - Discourage marijuana use, especially with pain medication.      Other Visit Diagnoses       Need for influenza vaccination       Relevant Orders   Flu vaccine trivalent PF, 6mos and older(Flulaval,Afluria,Fluarix,Fluzone) (Completed)     Encounter for lipid screening for cardiovascular disease       Relevant Orders   Lipid panel      No follow-ups on file.     Jeanine Caven R Daniesha Driver, FNP

## 2023-12-04 NOTE — Assessment & Plan Note (Signed)
Continues Eliquis 5 mg twice daily

## 2023-12-04 NOTE — Assessment & Plan Note (Addendum)
  Cerumen impaction in the right ear without hearing difficulties. - Recommend Debrox for cerumen removal.

## 2023-12-04 NOTE — Assessment & Plan Note (Signed)
 Continue vitamin D  50,000 units once weekly Last vitamin D  Lab Results  Component Value Date   VD25OH 53.25 11/20/2023

## 2023-12-04 NOTE — Assessment & Plan Note (Signed)
 Routine annual physical examination. - Encourage moderate exercise 30 minutes, five days a week. - Recommend heart-healthy, low-salt, low-fat diet. - Advise hydration with at least 64 ounces of water daily. - Recommend 7-8 hours of sleep nightly. - Advise use of seatbelts. - Discourage marijuana use, especially with pain medication.

## 2023-12-04 NOTE — Assessment & Plan Note (Addendum)
 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 - Continue Percocet 10-325 mg every 4 hours as needed for moderate to severe pain. - Continue ibuprofen  800 mg every 8 hours as needed for mild pain. Continue folic acid  1 mg daily, hydroxyurea  500 mg twice daily - Avoid triggers for sickle cell crisis such as extreme temperatures, stress, and infections. - Seek urgent medical attention if signs of crisis occur. - Store pain medication safely. - Manage constipation with hydration, vegetables, and consider Miralax  if needed. - Ensure annual eye examination. Flu vaccine given in the office today Urinalysis negative for protein

## 2023-12-05 ENCOUNTER — Ambulatory Visit: Payer: Self-pay | Admitting: Nurse Practitioner

## 2023-12-05 LAB — LIPID PANEL
Chol/HDL Ratio: 3.5 ratio (ref 0.0–5.0)
Cholesterol, Total: 148 mg/dL (ref 100–199)
HDL: 42 mg/dL (ref >39)
LDL Chol Calc (NIH): 81 mg/dL (ref 0–99)
Triglycerides: 140 mg/dL (ref 0–149)
VLDL Cholesterol Cal: 25 mg/dL (ref 5–40)

## 2023-12-05 LAB — CBC WITH DIFFERENTIAL/PLATELET
Basophils Absolute: 0 x10E3/uL (ref 0.0–0.2)
Basos: 0 %
EOS (ABSOLUTE): 0.3 x10E3/uL (ref 0.0–0.4)
Eos: 3 %
Hematocrit: 35.7 % — ABNORMAL LOW (ref 37.5–51.0)
Hemoglobin: 11.2 g/dL — ABNORMAL LOW (ref 13.0–17.7)
Immature Grans (Abs): 0 x10E3/uL (ref 0.0–0.1)
Immature Granulocytes: 0 %
Lymphocytes Absolute: 1.7 x10E3/uL (ref 0.7–3.1)
Lymphs: 22 %
MCH: 29.8 pg (ref 26.6–33.0)
MCHC: 31.4 g/dL — ABNORMAL LOW (ref 31.5–35.7)
MCV: 95 fL (ref 79–97)
Monocytes Absolute: 0.6 x10E3/uL (ref 0.1–0.9)
Monocytes: 8 %
NRBC: 6 % — ABNORMAL HIGH (ref 0–0)
Neutrophils Absolute: 5.1 x10E3/uL (ref 1.4–7.0)
Neutrophils: 67 %
Platelets: 550 x10E3/uL — ABNORMAL HIGH (ref 150–450)
RBC: 3.76 x10E6/uL — ABNORMAL LOW (ref 4.14–5.80)
RDW: 16.1 % — ABNORMAL HIGH (ref 11.6–15.4)
WBC: 7.7 x10E3/uL (ref 3.4–10.8)

## 2023-12-05 LAB — FERRITIN: Ferritin: 1645 ng/mL — ABNORMAL HIGH (ref 30–400)

## 2023-12-05 NOTE — Telephone Encounter (Signed)
 Patient was called to go over lab results. Patient expressed understanding of recommendations and lab results

## 2023-12-05 NOTE — Telephone Encounter (Signed)
-----   Message from Folashade R Paseda sent at 12/05/2023  8:25 AM EDT ----- Your labs are stable, please continue current medications ----- Message ----- From: Victory Iha, RMA Sent: 12/04/2023   9:33 AM EDT To: Folashade R Paseda, FNP

## 2023-12-08 LAB — TOXASSURE FLEX 15, UR
6-ACETYLMORPHINE IA: NEGATIVE ng/mL
7-aminoclonazepam: NOT DETECTED ng/mg{creat}
AMPHETAMINES IA: NEGATIVE ng/mL
Alpha-hydroxyalprazolam: NOT DETECTED ng/mg{creat}
Alpha-hydroxymidazolam: NOT DETECTED ng/mg{creat}
Alpha-hydroxytriazolam: NOT DETECTED ng/mg{creat}
Alprazolam: NOT DETECTED ng/mg{creat}
BARBITURATES IA: NEGATIVE ng/mL
Buprenorphine: NOT DETECTED ng/mg{creat}
COCAINE METABOLITE IA: NEGATIVE ng/mL
Clonazepam: NOT DETECTED ng/mg{creat}
Creatinine: 83 mg/dL (ref >=20)
Desalkylflurazepam: NOT DETECTED ng/mg{creat}
Desmethyldiazepam: NOT DETECTED ng/mg{creat}
Desmethylflunitrazepam: NOT DETECTED ng/mg{creat}
Diazepam: NOT DETECTED ng/mg{creat}
ETHYL ALCOHOL Enzymatic: NEGATIVE g/dL
Fentanyl: NOT DETECTED ng/mg{creat}
Flunitrazepam: NOT DETECTED ng/mg{creat}
Lorazepam: NOT DETECTED ng/mg{creat}
METHADONE IA: NEGATIVE ng/mL
METHADONE MTB IA: NEGATIVE ng/mL
Midazolam: NOT DETECTED ng/mg{creat}
Norbuprenorphine: NOT DETECTED ng/mg{creat}
Norfentanyl: NOT DETECTED ng/mg{creat}
OPIATE CLASS IA: NEGATIVE ng/mL
Oxazepam: NOT DETECTED ng/mg{creat}
PHENCYCLIDINE IA: NEGATIVE ng/mL
TAPENTADOL, IA: NEGATIVE ng/mL
TRAMADOL IA: NEGATIVE ng/mL
Temazepam: NOT DETECTED ng/mg{creat}

## 2023-12-08 LAB — OXYCODONE CLASS, MS, UR RFX
Noroxycodone: NOT DETECTED ng/mg{creat}
Noroxymorphone: NOT DETECTED ng/mg{creat}
Oxycodone Class Confirmation: POSITIVE — AB
Oxycodone: NOT DETECTED ng/mg{creat}
Oxymorphone: 167 ng/mg{creat}

## 2023-12-08 LAB — CANNABINOIDS, MS, UR RFX
Cannabinoids Confirmation: POSITIVE — AB
Carboxy-THC: 584 ng/mg{creat}

## 2023-12-09 DIAGNOSIS — D5700 Hb-SS disease with crisis, unspecified: Secondary | ICD-10-CM | POA: Diagnosis not present

## 2023-12-09 LAB — LAB REPORT - SCANNED

## 2023-12-11 ENCOUNTER — Other Ambulatory Visit: Payer: Self-pay | Admitting: Nurse Practitioner

## 2023-12-11 DIAGNOSIS — G894 Chronic pain syndrome: Secondary | ICD-10-CM

## 2023-12-11 DIAGNOSIS — D571 Sickle-cell disease without crisis: Secondary | ICD-10-CM

## 2023-12-12 ENCOUNTER — Other Ambulatory Visit (HOSPITAL_COMMUNITY): Payer: Self-pay

## 2023-12-12 MED ORDER — OXYCODONE-ACETAMINOPHEN 10-325 MG PO TABS
1.0000 | ORAL_TABLET | ORAL | 0 refills | Status: DC | PRN
  Filled 2023-12-16: qty 90, 15d supply, fill #0

## 2023-12-12 NOTE — Telephone Encounter (Signed)
 Please advise [facility]

## 2023-12-16 ENCOUNTER — Other Ambulatory Visit (HOSPITAL_COMMUNITY): Payer: Self-pay

## 2023-12-18 DIAGNOSIS — Z86718 Personal history of other venous thrombosis and embolism: Secondary | ICD-10-CM | POA: Diagnosis not present

## 2023-12-18 DIAGNOSIS — D5700 Hb-SS disease with crisis, unspecified: Secondary | ICD-10-CM | POA: Diagnosis not present

## 2023-12-18 DIAGNOSIS — Z832 Family history of diseases of the blood and blood-forming organs and certain disorders involving the immune mechanism: Secondary | ICD-10-CM | POA: Diagnosis not present

## 2023-12-18 DIAGNOSIS — Z8701 Personal history of pneumonia (recurrent): Secondary | ICD-10-CM | POA: Diagnosis not present

## 2023-12-18 DIAGNOSIS — Z9049 Acquired absence of other specified parts of digestive tract: Secondary | ICD-10-CM | POA: Diagnosis not present

## 2023-12-18 DIAGNOSIS — Z87891 Personal history of nicotine dependence: Secondary | ICD-10-CM | POA: Diagnosis not present

## 2023-12-18 DIAGNOSIS — M545 Low back pain, unspecified: Secondary | ICD-10-CM | POA: Diagnosis not present

## 2023-12-18 DIAGNOSIS — Z79899 Other long term (current) drug therapy: Secondary | ICD-10-CM | POA: Diagnosis not present

## 2023-12-18 DIAGNOSIS — Z8481 Family history of carrier of genetic disease: Secondary | ICD-10-CM | POA: Diagnosis not present

## 2023-12-18 NOTE — ED Notes (Signed)
 Pt says he feels OK to go home now. States he has a ride waiting.   Chiquita Gerard Molt, RN 12/18/23 [phone]

## 2023-12-18 NOTE — ED Provider Notes (Signed)
 [facility]  Emergency Medicine Care Note   Chief Complaint  Patient presents with  . Sickle Cell Pain Crisis  . Back Pain    History   34 year old male with history of sickle cell anemia and prior DVT in 2018 presents complaining of crisis pain.  Patient complains of low back pain which is his typical crisis pain not relieved by his home Percocet.  He denies any fevers.  No chest pain or shortness of breath.  No abdominal pain nausea or vomiting.  No diarrhea.  No urinary symptoms.    Medical History[1] Surgical History[2] Family History[3] Social History[4]     Review of Systems Review of Systems  Constitutional:  Negative for chills and fever.  HENT:  Negative for congestion, rhinorrhea and sore throat.   Eyes:  Negative for discharge.  Respiratory:  Negative for cough and shortness of breath.   Cardiovascular:  Negative for chest pain.  Gastrointestinal:  Negative for abdominal pain, diarrhea, nausea and vomiting.  Genitourinary:  Negative for difficulty urinating and dysuria.  Musculoskeletal:  Positive for back pain.       Physical Exam   Physical Exam ED Triage Vitals  Temp 12/18/23 1157 98.7 F (37.1 C)  Heart Rate 12/18/23 1157 96  Resp 12/18/23 1157 19  BP 12/18/23 1201 126/66  MAP (mmHg) 12/18/23 1201 90  SpO2 12/18/23 1157 97 %  O2 Device 12/18/23 1157 None (Room air)  O2 Flow Rate (L/min) --   Weight 12/18/23 1157 56 kg (123 lb 7 oz)   Physical Exam Vitals and nursing note reviewed.  Constitutional:      Appearance: Normal appearance.  HENT:     Head: Normocephalic and atraumatic.     Nose: Nose normal.  Eyes:     Conjunctiva/sclera: Conjunctivae normal.  Cardiovascular:     Rate and Rhythm: Normal rate and regular rhythm.  Pulmonary:     Effort: Pulmonary effort is normal.     Breath sounds: Normal breath sounds.  Abdominal:     Palpations: Abdomen is soft.     Tenderness: There is no abdominal tenderness. There  is no guarding or rebound.  Musculoskeletal:        General: Normal range of motion.     Cervical back: Normal range of motion and neck supple.  Skin:    General: Skin is warm and dry.     Capillary Refill: Capillary refill takes less than 2 seconds.  Neurological:     General: No focal deficit present.     Mental Status: He is alert.  Psychiatric:        Mood and Affect: Mood normal.        Scoring Tools Utilized    CHA2DS2-VASc Score: N/A  Glasgow Coma Scale Score: 15                      Procedures Performed   Procedures    External Records Reviewed   PDMP reviewed, potential concern identified due to patient has regular prescription for Percocet 10 mg last filled 12/16/2023   Patient's prior visit on 12/04/2023 was reviewed.  The note does state that patient's usual pain is in his lower back.  Patient is maintained on Percocet folic acid  ibuprofen  vitamin D  and Eliquis .  He is also on hydroxyurea .  Differential Diagnosis   Sickle cell crisis, acute chest, UTI EKG Obtained and Independently Reviewed    Labs and Imaging Independently Reviewed   Labs Reviewed  BASIC METABOLIC PANEL - Abnormal      Result Value   Sodium 137     Potassium 3.6     Chloride 106     CO2 24     Anion Gap 7     Glucose, Random 68 (*)    Blood Urea Nitrogen (BUN) 7     Creatinine 0.50 (*)    eGFR >90     Calcium 8.3 (*)    BUN/Creatinine Ratio 14.0    RETICULOCYTE COUNT - Abnormal   Reticulocyte Absolute 193.8 (*)    Reticulocyte % 4.9 (*)   CBC WITH DIFFERENTIAL - Abnormal   WBC 7.60     RBC 3.99 (*)    Hemoglobin 11.8 (*)    Hematocrit 34.7 (*)    Mean Corpuscular Volume (MCV) 86.8     Mean Corpuscular Hemoglobin (MCH) 29.5     Mean Corpuscular Hemoglobin Conc (MCHC) 34.0     Red Cell Distribution Width (RDW) 17.9 (*)    Platelet Count (PLT) 402     Mean Platelet Volume (MPV) 8.6     Neutrophils % 62     Lymphocytes % 26     Monocytes % 7     Eosinophils % 3      Basophils % 2     Neutrophils Absolute 4.70     Lymphocytes # 1.90     Monocytes # 0.60     Eosinophils # 0.20     Basophils # 0.20    URINALYSIS WITH REFLEX TO MICROSCOPIC - Abnormal   Color, Urine Yellow     Clarity, Urine Clear     Specific Gravity, Urine 1.020     pH, Urine 7.5     Protein, Urine Trace (*)    Glucose, Urine Negative     Ketones, Urine Negative     Bilirubin, Urine Small (*)    Blood, Urine Trace (*)    Nitrite, Urine Negative     Leukocyte Esterase, Urine Negative     Urobilinogen, Urine >=8.0 (*)   URINALYSIS, MICROSCOPIC ONLY - Normal   WBC, Urine None Seen     RBC, Urine 0-2     Bacteria, Urine Rare      XR Spine Lumbar Complete 4+ Views  Final Result by Deward Franky Conn, MD (11/03 1236)  XR SPINE LUMBAR 4+ VIEWS, 12/18/2023 12:21 PM    INDICATION: lower back pain     COMPARISON: 10/11/2016    VIEWS: 5  Diffuse sclerotic changes are again noted consistent sickle cell  disease. Vertebral endplate changes are also present consistent with  sickle cell disease. No interim change. No acute abnormality  identified.    IMPRESSION:  Conclusion:  Diffuse changes of sickle cell disease again noted. No interim  change. No acute abnormality identified .          Patient's labs with a normal white count hemoglobin of 11.8 and normal platelets.  Reticulocyte count is 4.9.  BMP with a glucose of 68 which was followed up with a fingerstick which was normal.  Urinalysis is negative for leukocytes and nitrites with trace blood.  Only 0-2 RBCs on microscopic. Lumbar x-ray with changes of sickle cell per radiology.  Independently reviewed. MDM  Medical Decision Making Amount and/or Complexity of Data Reviewed External Data Reviewed: notes. Labs: ordered. Decision-making details documented in ED Course. Radiology: ordered. Decision-making details documented in ED Course. ECG/medicine tests: ordered. Decision-making details documented in ED  Course.  Risk Prescription  drug management. Parenteral controlled substances. Decision regarding hospitalization.    33 year old male with known sickle cell disease presents for crisis pain.  Patient complains of low back pain which is his typical crisis pain.  On arrival here he is afebrile and hemodynamically stable.  His labs are reassuring with a normal white count and a reticulocyte count of 4.9.  Glucose was 68 on BMP which was followed up with a normal fingerstick.  Urinalysis without evidence of infection.  Patient was given 3 rounds of medication with IV Dilaudid  x 2 and pain dose ketamine  x 1 with improvement of symptoms.  Patient was offered admission but feels he can control his symptoms at home with his p.o. Percocet.  Patient given return precautions and verbalized understanding.   Diagnosis and Disposition   Diagnosis: 1. Sickle cell crisis    (CMD)      Disposition:  Discharge   Prescriptions: ED Prescriptions   None           [1] Past Medical History: Diagnosis Date  . Acute chest syndrome due to sickle cell crisis    (CMD) 11/08/2014  . Acute chest syndrome(517.3)   . Acute midline low back pain without sciatica 07/22/2017  . Acute on chronic anemia 11/08/2014  . Anxiety 09/15/2020  . Asthma    childhood  . Attempted suicide    (CMD)   . Chronic pain 03/14/2011  . Community acquired pneumonia of left lower lobe of lung 04/09/2019  . Depression   . DVT (deep venous thrombosis) 12/21/2017  . Generalized weakness 03/31/2019  . Hyperbilirubinemia 03/30/2014  . Leucocytosis 06/12/2011  . Osteomyelitis of left lower limb    (CMD)   . Osteomyelitis of left shoulder region    (CMD)   . Pneumonia   . Sickle cell anemia    (CMD)   . Sickle cell anemia    (CMD)   . Sickle cell crisis    (CMD) 10/08/2018  . Sickle cell disease    (CMD)   . SIRS (systemic inflammatory response syndrome)    (CMD) 02/10/2019  . Thrombocythemia 05/31/2018  . Transfusion history    [2] Past Surgical History: Procedure Laterality Date  . CHOLECYSTECTOMY     Procedure: CHOLECYSTECTOMY  . ORTHOPEDIC SURGERY Left    left arm bone infection surgery  [3] Family History Problem Relation Name Age of Onset  . Sickle cell trait Father Ewin   . Sickle cell trait Mother Nena   . Sickle cell trait Son    . Sickle cell trait Sister    [4] Social History Tobacco Use  . Smoking status: Former    Current packs/day: 0.25    Types: Cigarettes  . Smokeless tobacco: Never  Substance Use Topics  . Alcohol  use: No

## 2023-12-18 NOTE — ED Triage Notes (Signed)
 Pt presents to ED with cc lower back pain that started last night. Denies any known injuries. Denies any urinary ss. PT states the pain is worsened with various movements. Took percocet for this which he states provided relief.

## 2023-12-22 ENCOUNTER — Encounter (HOSPITAL_COMMUNITY): Payer: Self-pay

## 2023-12-22 ENCOUNTER — Other Ambulatory Visit: Payer: Self-pay

## 2023-12-22 ENCOUNTER — Emergency Department (HOSPITAL_COMMUNITY)
Admission: EM | Admit: 2023-12-22 | Discharge: 2023-12-22 | Disposition: A | Discharge status: Home / Self Care | Attending: Emergency Medicine | Admitting: Emergency Medicine

## 2023-12-22 DIAGNOSIS — D5700 Hb-SS disease with crisis, unspecified: Secondary | ICD-10-CM | POA: Diagnosis not present

## 2023-12-22 DIAGNOSIS — I82409 Acute embolism and thrombosis of unspecified deep veins of unspecified lower extremity: Secondary | ICD-10-CM | POA: Insufficient documentation

## 2023-12-22 DIAGNOSIS — Z7901 Long term (current) use of anticoagulants: Secondary | ICD-10-CM | POA: Diagnosis not present

## 2023-12-22 LAB — CBC WITH DIFFERENTIAL/PLATELET
Abs Immature Granulocytes: 0.04 K/uL (ref 0.00–0.07)
Basophils Absolute: 0.1 K/uL (ref 0.0–0.1)
Basophils Relative: 1 %
Eosinophils Absolute: 0.3 K/uL (ref 0.0–0.5)
Eosinophils Relative: 3 %
HCT: 33.6 % — ABNORMAL LOW (ref 39.0–52.0)
Hemoglobin: 11.1 g/dL — ABNORMAL LOW (ref 13.0–17.0)
Immature Granulocytes: 0 %
Lymphocytes Relative: 32 %
Lymphs Abs: 2.9 K/uL (ref 0.7–4.0)
MCH: 28.3 pg (ref 26.0–34.0)
MCHC: 33 g/dL (ref 30.0–36.0)
MCV: 85.7 fL (ref 80.0–100.0)
Monocytes Absolute: 0.8 K/uL (ref 0.1–1.0)
Monocytes Relative: 9 %
Neutro Abs: 5 K/uL (ref 1.7–7.7)
Neutrophils Relative %: 55 %
Platelets: 385 K/uL (ref 150–400)
RBC: 3.92 MIL/uL — ABNORMAL LOW (ref 4.22–5.81)
RDW: 19 % — ABNORMAL HIGH (ref 11.5–15.5)
WBC: 9.1 K/uL (ref 4.0–10.5)
nRBC: 1.9 % — ABNORMAL HIGH (ref 0.0–0.2)

## 2023-12-22 LAB — COMPREHENSIVE METABOLIC PANEL WITH GFR
ALT: 6 U/L (ref 0–44)
AST: 25 U/L (ref 15–41)
Albumin: 4.4 g/dL (ref 3.5–5.0)
Alkaline Phosphatase: 76 U/L (ref 38–126)
Anion gap: 9 (ref 5–15)
BUN: 6 mg/dL (ref 6–20)
CO2: 25 mmol/L (ref 22–32)
Calcium: 9.2 mg/dL (ref 8.9–10.3)
Chloride: 106 mmol/L (ref 98–111)
Creatinine, Ser: 0.73 mg/dL (ref 0.61–1.24)
GFR, Estimated: >60 mL/min (ref >60)
Glucose, Bld: 92 mg/dL (ref 70–99)
Potassium: 3.9 mmol/L (ref 3.5–5.1)
Sodium: 140 mmol/L (ref 135–145)
Total Bilirubin: 3.3 mg/dL — ABNORMAL HIGH (ref 0.0–1.2)
Total Protein: 7.7 g/dL (ref 6.5–8.1)

## 2023-12-22 LAB — RETICULOCYTES
Immature Retic Fract: 36 % — ABNORMAL HIGH (ref 2.3–15.9)
RBC.: 3.9 MIL/uL — ABNORMAL LOW (ref 4.22–5.81)
Retic Count, Absolute: 309.5 K/uL — ABNORMAL HIGH (ref 19.0–186.0)
Retic Ct Pct: 7.9 % — ABNORMAL HIGH (ref 0.4–3.1)

## 2023-12-22 MED ORDER — HYDROMORPHONE HCL 1 MG/ML IJ SOLN
2.0000 mg | INTRAMUSCULAR | Status: AC
  Administered 2023-12-22: 2 mg via INTRAVENOUS
  Filled 2023-12-22: qty 2

## 2023-12-22 MED ORDER — KETAMINE HCL 50 MG/5ML IJ SOSY
0.3000 mg/kg | PREFILLED_SYRINGE | Freq: Once | INTRAMUSCULAR | Status: AC
  Administered 2023-12-22: 18 mg via INTRAVENOUS
  Filled 2023-12-22: qty 5

## 2023-12-22 MED ORDER — DEXTROSE-SODIUM CHLORIDE 5-0.45 % IV SOLN: INTRAVENOUS | Status: DC

## 2023-12-22 MED ORDER — ONDANSETRON HCL 4 MG/2ML IJ SOLN
4.0000 mg | INTRAMUSCULAR | Status: DC | PRN
  Administered 2023-12-22: 4 mg via INTRAVENOUS
  Filled 2023-12-22: qty 2

## 2023-12-22 MED ORDER — KETOROLAC TROMETHAMINE 15 MG/ML IJ SOLN
15.0000 mg | INTRAMUSCULAR | Status: AC
  Administered 2023-12-22: 15 mg via INTRAVENOUS
  Filled 2023-12-22: qty 1

## 2023-12-22 NOTE — ED Triage Notes (Signed)
 C/o sickle cell pain in lower back starting a few hours ago. Toook a percocet without relief.

## 2023-12-22 NOTE — Discharge Instructions (Addendum)
 As we discussed, your workup in the ER today was reassuring for acute findings.  Given that you are feeling better with interventions today, no further evaluation is indicated.  I recommend that you call your primary care provider for follow-up.  Take your home medicines as prescribed  Return if development of any new or worsening symptoms

## 2023-12-22 NOTE — ED Provider Notes (Signed)
 [place] EMERGENCY DEPARTMENT AT [facility] Provider Note   CSN: [id number] Arrival date & time: 12/22/23  1351     Patient presents with: Sickle Cell Pain Crisis   Timothy Marks is a 33 y.o. male.   Patient with history of sickle cell anemia, DVT on Eliquis  presents today with complaints of sickle cell pain crisis. He reports that pain began around noon today and has been persistent since then. He reports that pain is in his lower back and consistent with his typical sickle cell pain.  Take his home pain medication without significant improvement.  Denies fevers or chills, no chest pain, shortness of breath, nausea, or vomiting. Requests 2 rounds of dilaudid  and 1 of ketamine .   The history is provided by the patient. No language interpreter was used.  Sickle Cell Pain Crisis      Prior to Admission medications   Medication Sig Start Date End Date Taking? Authorizing Provider  apixaban  (ELIQUIS ) 5 MG TABS tablet Take 1 tablet (5 mg total) by mouth 2 (two) times daily. 08/30/23   Paseda, Folashade R, FNP  cetirizine (ZYRTEC) 10 MG tablet Take 10 mg by mouth daily.    [provider]  diphenhydrAMINE  (BENADRYL ) 25 mg capsule Take 1 capsule (25 mg total) by mouth every 6 (six) hours as needed for itching. 01/17/23   Paseda, Folashade R, FNP  ergocalciferol  (VITAMIN D2) 1.25 MG (50000 UT) capsule Take 1 capsule (50,000 Units total) by mouth once a week. 05/16/23     folic acid  (FOLVITE ) 1 MG tablet Take 1 tablet (1 mg total) by mouth daily. 08/30/23   Paseda, Folashade R, FNP  hydroxyurea  (HYDREA ) 500 MG capsule Take 1 capsule (500 mg total) by mouth 2 (two) times daily. May take with food to minimize GI side effects. 06/19/23   Paseda, Folashade R, FNP  ibuprofen  (ADVIL ) 800 MG tablet Take 1 tablet (800 mg total) by mouth every 8 (eight) hours as needed. 08/30/23   Paseda, Folashade R, FNP  naloxone  (NARCAN ) nasal spray 4 mg/0.1 mL Place 1 spray into the nose once.  09/04/22   [provider]  oxyCODONE -acetaminophen  (PERCOCET) 10-325 MG tablet Take 1 tablet by mouth every 4 (four) hours as needed for up to 15 days for pain. 12/16/23 12/31/23  Paseda, Folashade R, FNP    Allergies: Buprenorphine hcl, Levaquin  [levofloxacin ], Meperidine, Morphine , Morphine  and codeine, Tramadol, Vancomycin, and Zosyn [piperacillin sod-tazobactam so]    Review of Systems  All other systems reviewed and are negative.   Updated Vital Signs BP 118/74   Pulse 73   Temp 98.5 F (36.9 C) (Oral)   Resp 18   Wt 61.2 kg   SpO2 97%   BMI 17.33 kg/m   Physical Exam Vitals and nursing note reviewed.  Constitutional:      General: He is not in acute distress.    Appearance: Normal appearance. He is normal weight. He is not ill-appearing, toxic-appearing or diaphoretic.  HENT:     Head: Normocephalic and atraumatic.  Cardiovascular:     Rate and Rhythm: Normal rate.  Pulmonary:     Effort: Pulmonary effort is normal. No respiratory distress.  Musculoskeletal:        General: Normal range of motion.     Cervical back: Normal range of motion.  Skin:    General: Skin is warm and dry.  Neurological:     General: No focal deficit present.     Mental Status: He is alert.  Psychiatric:        Mood and Affect: Mood normal.        Behavior: Behavior normal.     (all labs ordered are listed, but only abnormal results are displayed) Labs Reviewed  CBC WITH DIFFERENTIAL/PLATELET - Abnormal; Notable for the following components:      Result Value   RBC 3.92 (*)    Hemoglobin 11.1 (*)    HCT 33.6 (*)    RDW 19.0 (*)    nRBC 1.9 (*)    All other components within normal limits  RETICULOCYTES - Abnormal; Notable for the following components:   Retic Ct Pct 7.9 (*)    RBC. 3.90 (*)    Retic Count, Absolute 309.5 (*)    Immature Retic Fract 36.0 (*)    All other components within normal limits  COMPREHENSIVE METABOLIC PANEL WITH GFR - Abnormal; Notable for the  following components:   Total Bilirubin 3.3 (*)    All other components within normal limits    EKG: None  Radiology: No results found.   Procedures   Medications Ordered in the ED  dextrose  5 % and 0.45 % NaCl infusion ( Intravenous New Bag/Given 12/22/23 1504)  ondansetron  (ZOFRAN ) injection 4 mg (4 mg Intravenous Given 12/22/23 1504)  ketorolac  (TORADOL ) 15 MG/ML injection 15 mg (15 mg Intravenous Given 12/22/23 1505)  HYDROmorphone  (DILAUDID ) injection 2 mg (2 mg Intravenous Given 12/22/23 1505)  HYDROmorphone  (DILAUDID ) injection 2 mg (2 mg Intravenous Given 12/22/23 1537)  ketamine  50 mg in normal saline 5 mL (10 mg/mL) syringe (18 mg Intravenous Given 12/22/23 1655)                                    Medical Decision Making Amount and/or Complexity of Data Reviewed Labs: ordered.  Risk Prescription drug management.   This patient is a 33 y.o. 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  Additional history: Chart reviewed. Pertinent results include: previous visits and admissions for similar complaints, last visit was on 11/3, received IV dilaudid  and pain dose ketamine  and was discharged  Physical Exam: Physical exam performed. The pertinent findings include: generally uncomfortable appearing but otherwise unremarkable exam  Lab Tests: I ordered, and personally interpreted labs.  The pertinent results include:  no leukocytosis, hgb 11.1 consistent with previous.  No other acute laboratory abnormalities.   Medications: I ordered medication including IV dilaudid , ketamine , IV fluids, toradol , zofran   for pain  control. Reevaluation of the patient after these medicines showed that the patient improved. I have reviewed the patients home medicines and have made adjustments as needed.   Disposition: After consideration of the diagnostic results and the patients response to treatment, I feel that emergency department workup does not suggest an emergent condition requiring admission or immediate intervention beyond what has been performed at this time. The plan is: Discharge with close outpatient follow-up and return precautions.  After above interventions, patient is feeling better and ready to go home.Evaluation and diagnostic testing in the emergency department does not suggest an emergent condition requiring admission or immediate intervention beyond what has been performed  at this time.  Plan for discharge with close PCP follow-up.  Patient is understanding and amenable with plan, educated on red flag symptoms that would prompt immediate return.  Patient discharged in stable condition.  Findings and plan of care discussed with supervising physician Dr. Ellouise who is in agreement.    Final diagnoses:  Sickle cell pain crisis [facility])    ED Discharge Orders     None     An After Visit Summary was printed and given to the patient.      Reynaldo Rossman A, PA-C 12/22/23 1752    Kingsley, Victoria K, DO 12/25/23 1554

## 2023-12-25 ENCOUNTER — Other Ambulatory Visit: Payer: Self-pay

## 2023-12-25 ENCOUNTER — Emergency Department (HOSPITAL_COMMUNITY): Admission: EM | Admit: 2023-12-25 | Discharge: 2023-12-25 | Disposition: A | Discharge status: Home / Self Care

## 2023-12-25 ENCOUNTER — Encounter (HOSPITAL_COMMUNITY): Payer: Self-pay

## 2023-12-25 ENCOUNTER — Emergency Department (HOSPITAL_COMMUNITY)

## 2023-12-25 ENCOUNTER — Other Ambulatory Visit: Payer: Self-pay | Admitting: Nurse Practitioner

## 2023-12-25 DIAGNOSIS — M545 Low back pain, unspecified: Secondary | ICD-10-CM | POA: Diagnosis not present

## 2023-12-25 DIAGNOSIS — D5700 Hb-SS disease with crisis, unspecified: Secondary | ICD-10-CM | POA: Diagnosis not present

## 2023-12-25 DIAGNOSIS — R079 Chest pain, unspecified: Secondary | ICD-10-CM | POA: Diagnosis not present

## 2023-12-25 DIAGNOSIS — G894 Chronic pain syndrome: Secondary | ICD-10-CM

## 2023-12-25 DIAGNOSIS — D571 Sickle-cell disease without crisis: Secondary | ICD-10-CM

## 2023-12-25 LAB — URINALYSIS, ROUTINE W REFLEX MICROSCOPIC
Bacteria, UA: NONE SEEN
Bilirubin Urine: NEGATIVE
Glucose, UA: NEGATIVE mg/dL
Ketones, ur: NEGATIVE mg/dL
Nitrite: NEGATIVE
Protein, ur: NEGATIVE mg/dL
Specific Gravity, Urine: 1.013 (ref 1.005–1.030)
pH: 5 (ref 5.0–8.0)

## 2023-12-25 LAB — COMPREHENSIVE METABOLIC PANEL WITH GFR
ALT: 6 U/L (ref 0–44)
AST: 30 U/L (ref 15–41)
Albumin: 4.5 g/dL (ref 3.5–5.0)
Alkaline Phosphatase: 80 U/L (ref 38–126)
Anion gap: 13 (ref 5–15)
BUN: 8 mg/dL (ref 6–20)
CO2: 23 mmol/L (ref 22–32)
Calcium: 9.5 mg/dL (ref 8.9–10.3)
Chloride: 105 mmol/L (ref 98–111)
Creatinine, Ser: 0.66 mg/dL (ref 0.61–1.24)
GFR, Estimated: >60 mL/min (ref >60)
Glucose, Bld: 104 mg/dL — ABNORMAL HIGH (ref 70–99)
Potassium: 3.3 mmol/L — ABNORMAL LOW (ref 3.5–5.1)
Sodium: 141 mmol/L (ref 135–145)
Total Bilirubin: 3.8 mg/dL — ABNORMAL HIGH (ref 0.0–1.2)
Total Protein: 7.5 g/dL (ref 6.5–8.1)

## 2023-12-25 LAB — CBC WITH DIFFERENTIAL/PLATELET
Abs Immature Granulocytes: 0.09 K/uL — ABNORMAL HIGH (ref 0.00–0.07)
Basophils Absolute: 0 K/uL (ref 0.0–0.1)
Basophils Relative: 0 %
Eosinophils Absolute: 0.1 K/uL (ref 0.0–0.5)
Eosinophils Relative: 1 %
HCT: 28.8 % — ABNORMAL LOW (ref 39.0–52.0)
Hemoglobin: 10.1 g/dL — ABNORMAL LOW (ref 13.0–17.0)
Immature Granulocytes: 1 %
Lymphocytes Relative: 16 %
Lymphs Abs: 2.2 K/uL (ref 0.7–4.0)
MCH: 29.6 pg (ref 26.0–34.0)
MCHC: 35.1 g/dL (ref 30.0–36.0)
MCV: 84.5 fL (ref 80.0–100.0)
Monocytes Absolute: 0.9 K/uL (ref 0.1–1.0)
Monocytes Relative: 7 %
Neutro Abs: 10.7 K/uL — ABNORMAL HIGH (ref 1.7–7.7)
Neutrophils Relative %: 75 %
Platelets: 428 K/uL — ABNORMAL HIGH (ref 150–400)
RBC: 3.41 MIL/uL — ABNORMAL LOW (ref 4.22–5.81)
RDW: 19.6 % — ABNORMAL HIGH (ref 11.5–15.5)
Smear Review: NORMAL
WBC: 14 K/uL — ABNORMAL HIGH (ref 4.0–10.5)
nRBC: 1.1 % — ABNORMAL HIGH (ref 0.0–0.2)

## 2023-12-25 LAB — RETICULOCYTES
Immature Retic Fract: 31.5 % — ABNORMAL HIGH (ref 2.3–15.9)
RBC.: 3.65 MIL/uL — ABNORMAL LOW (ref 4.22–5.81)
Retic Count, Absolute: 424 K/uL — ABNORMAL HIGH (ref 19.0–186.0)
Retic Ct Pct: 11.6 % — ABNORMAL HIGH (ref 0.4–3.1)

## 2023-12-25 MED ORDER — HYDROMORPHONE HCL 1 MG/ML IJ SOLN: 2.0000 mg | INTRAMUSCULAR | Status: DC

## 2023-12-25 MED ORDER — HYDROMORPHONE HCL 1 MG/ML IJ SOLN
2.0000 mg | Freq: Once | INTRAMUSCULAR | Status: AC
  Administered 2023-12-25: 2 mg via SUBCUTANEOUS
  Filled 2023-12-25: qty 2

## 2023-12-25 MED ORDER — HYDROMORPHONE HCL 1 MG/ML IJ SOLN
2.0000 mg | INTRAMUSCULAR | Status: AC
  Administered 2023-12-25: 2 mg via INTRAVENOUS
  Filled 2023-12-25: qty 2

## 2023-12-25 MED ORDER — KETOROLAC TROMETHAMINE 15 MG/ML IJ SOLN
15.0000 mg | Freq: Once | INTRAMUSCULAR | Status: AC
  Administered 2023-12-25: 15 mg via INTRAVENOUS
  Filled 2023-12-25: qty 1

## 2023-12-25 MED ORDER — ONDANSETRON HCL 4 MG/2ML IJ SOLN
4.0000 mg | INTRAMUSCULAR | Status: DC | PRN
Start: 2023-12-25 — End: 2023-12-25
  Administered 2023-12-25: 4 mg via INTRAVENOUS
  Filled 2023-12-25: qty 2

## 2023-12-25 MED ORDER — KETAMINE HCL 50 MG/5ML IJ SOSY
0.1500 mg/kg | PREFILLED_SYRINGE | Freq: Once | INTRAMUSCULAR | Status: AC
  Administered 2023-12-25: 9.2 mg via INTRAVENOUS
  Filled 2023-12-25: qty 5

## 2023-12-25 MED ORDER — DIPHENHYDRAMINE HCL 25 MG PO CAPS
25.0000 mg | ORAL_CAPSULE | ORAL | Status: DC | PRN
  Administered 2023-12-25: 25 mg via ORAL
  Filled 2023-12-25: qty 1

## 2023-12-25 NOTE — ED Triage Notes (Signed)
 Pt reports with [facility] since this morning. Pt reports taking Percocet 10 mg and ibuprofen  500 mg for pain. Pt has lower back pain and chest pain.

## 2023-12-25 NOTE — ED Provider Notes (Signed)
 [place] EMERGENCY DEPARTMENT AT [facility] Provider Note   CSN: [id number] Arrival date & time: 12/25/23  9060     Patient presents with: Sickle Cell Pain Crisis   Timothy Marks is a 33 y.o. male.   33 year old male with past medical history of sickle cell disease presenting to the emergency department today with pain in his chest and arms.  This has been going on now for the past few days.  The patient states this is similar to symptoms he has had with previous sickle cell pain crises.  He has been taking ibuprofen  as well as his home Percocet without relief.  He states that he normally does get pain in his chest when he has sickle cell pain crises.  Denies any fevers or cough.  Denies any significant shortness of breath.  He came to the emergency department today for further evaluation regarding this due to ongoing symptoms.   Sickle Cell Pain Crisis Associated symptoms: chest pain        Prior to Admission medications   Medication Sig Start Date End Date Taking? Authorizing Provider  apixaban  (ELIQUIS ) 5 MG TABS tablet Take 1 tablet (5 mg total) by mouth 2 (two) times daily. 08/30/23   Paseda, Folashade R, FNP  cetirizine (ZYRTEC) 10 MG tablet Take 10 mg by mouth daily.    [provider]  diphenhydrAMINE  (BENADRYL ) 25 mg capsule Take 1 capsule (25 mg total) by mouth every 6 (six) hours as needed for itching. 01/17/23   Paseda, Folashade R, FNP  ergocalciferol  (VITAMIN D2) 1.25 MG (50000 UT) capsule Take 1 capsule (50,000 Units total) by mouth once a week. 05/16/23     folic acid  (FOLVITE ) 1 MG tablet Take 1 tablet (1 mg total) by mouth daily. 08/30/23   Paseda, Folashade R, FNP  hydroxyurea  (HYDREA ) 500 MG capsule Take 1 capsule (500 mg total) by mouth 2 (two) times daily. May take with food to minimize GI side effects. 06/19/23   Paseda, Folashade R, FNP  ibuprofen  (ADVIL ) 800 MG tablet Take 1 tablet (800 mg total) by mouth every 8 (eight) hours as needed.  08/30/23   Paseda, Folashade R, FNP  naloxone  (NARCAN ) nasal spray 4 mg/0.1 mL Place 1 spray into the nose once. 09/04/22   [provider]  oxyCODONE -acetaminophen  (PERCOCET) 10-325 MG tablet Take 1 tablet by mouth every 4 (four) hours as needed for up to 15 days for pain. 12/16/23 12/31/23  Paseda, Folashade R, FNP    Allergies: Buprenorphine hcl, Levaquin  [levofloxacin ], Meperidine, Morphine , Morphine  and codeine, Tramadol, Vancomycin, and Zosyn [piperacillin sod-tazobactam so]    Review of Systems  Cardiovascular:  Positive for chest pain.  Musculoskeletal:  Positive for arthralgias.  All other systems reviewed and are negative.   Updated Vital Signs BP 116/81   Pulse 73   Temp 98.4 F (36.9 C)   Resp 16   SpO2 97%   Physical Exam Vitals and nursing note reviewed.   Gen: Appears uncomfortable Eyes: PERRL, EOMI HEENT: no oropharyngeal swelling Neck: trachea midline Resp: clear to auscultation bilaterally Card: RRR, no murmurs, rubs, or gallops Abd: nontender, nondistended Extremities: no calf tenderness, no edema Vascular: 2+ radial pulses bilaterally, 2+ DP pulses bilaterally Skin: no rashes Psyc: acting appropriately   (all labs ordered are listed, but only abnormal results are displayed) Labs Reviewed  CBC WITH DIFFERENTIAL/PLATELET - Abnormal; Notable for the following components:      Result Value   WBC 14.0 (*)  RBC 3.41 (*)    Hemoglobin 10.1 (*)    HCT 28.8 (*)    RDW 19.6 (*)    Platelets 428 (*)    nRBC 1.1 (*)    Neutro Abs 10.7 (*)    Abs Immature Granulocytes 0.09 (*)    All other components within normal limits  RETICULOCYTES - Abnormal; Notable for the following components:   Retic Ct Pct 11.6 (*)    RBC. 3.65 (*)    Retic Count, Absolute 424.0 (*)    Immature Retic Fract 31.5 (*)    All other components within normal limits  COMPREHENSIVE METABOLIC PANEL WITH GFR - Abnormal; Notable for the following components:   Potassium 3.3 (*)     Glucose, Bld 104 (*)    Total Bilirubin 3.8 (*)    All other components within normal limits  URINALYSIS, ROUTINE W REFLEX MICROSCOPIC - Abnormal; Notable for the following components:   Hgb urine dipstick SMALL (*)    Leukocytes,Ua SMALL (*)    All other components within normal limits    EKG: None  Radiology: DG Chest Portable 1 View Result Date: 12/25/2023 CLINICAL DATA:  Chest and low back pain.  Sickle cell anemia. EXAM: PORTABLE CHEST 1 VIEW COMPARISON:  11/19/2023 FINDINGS: Stable normal-sized and clear lungs with normal vascularity. Mild peribronchial thickening with improvement. Stable diffuse bone sclerosis compatible with the history of sickle cell disease. IMPRESSION: Mild bronchitic changes with improvement. Electronically Signed   By: Elspeth Bathe M.D.   On: 12/25/2023 12:35     Procedures   Medications Ordered in the ED  diphenhydrAMINE  (BENADRYL ) capsule 25-50 mg (25 mg Oral Given 12/25/23 1345)  ondansetron  (ZOFRAN ) injection 4 mg (4 mg Intravenous Given 12/25/23 1346)  HYDROmorphone  (DILAUDID ) injection 2 mg (2 mg Subcutaneous Given 12/25/23 1124)  HYDROmorphone  (DILAUDID ) injection 2 mg (2 mg Intravenous Given 12/25/23 1335)  HYDROmorphone  (DILAUDID ) injection 2 mg (2 mg Intravenous Given 12/25/23 1412)  ketorolac  (TORADOL ) 15 MG/ML injection 15 mg (15 mg Intravenous Given 12/25/23 1350)  ketamine  50 mg in normal saline 5 mL (10 mg/mL) syringe (9.2 mg Intravenous Given 12/25/23 1457)                                    Medical Decision Making 33 year old male with past medical history of sickle cell disease presents emergency department today with pain distress and arms consistent with previous sickle cell pain crises.  Will further evaluate the patient here with basic labs as well as a reticulocyte count to evaluate for aplastic crisis.  Will also obtain a chest x-ray and EKG in regards to his chest discomfort to evaluate for acute chest syndrome.  The patient  is not tachycardic or hypoxic and suspicion for pulmonary embolism is low at this time.  He reports that he does have similar symptoms with sickle cell in the past.  Will give the patient pain medication and reevaluate for ultimate disposition.  The patient's workup here is reassuring.  He was still having pain after the Dilaudid .  He is given an analgesic dose of 0.1 mg/kg of ketamine .  Discussed this case with our sickle cell team.  They recommend discharge after reviewing his labs and they will see the patient tomorrow in the clinic.  Amount and/or Complexity of Data Reviewed Labs: ordered. Radiology: ordered.  Risk Prescription drug management.        Final diagnoses:  Sickle  cell pain crisis [facility])    ED Discharge Orders     None          Ula Prentice SAUNDERS, MD 12/25/23 1459

## 2023-12-25 NOTE — ED Provider Triage Note (Signed)
 Emergency Medicine Provider Triage Evaluation Note  Timothy Marks , a 33 y.o. male  was evaluated in triage.  Pt complains of a sickle cell crisis.  Patient reports he took his home medications without relief he called the sickle cell clinic and they were already full.  Patient complains of full body pain.  Patient denies any fever or chills  Review of Systems  Positive: Body aches Negative: Chest pain or fever  Physical Exam  BP 118/76 (BP Location: Left Arm)   Pulse 89   Temp 98.5 F (36.9 C) (Oral)   Resp 16   SpO2 98%  Gen:   Awake, no distress   Resp:  Normal effort  MSK:   Moves extremities without difficulty  Other:    Medical Decision Making  Medically screening exam initiated at 11:07 AM.  Appropriate orders placed.  Timothy Marks was informed that the remainder of the evaluation will be completed by another provider, this initial triage assessment does not replace that evaluation, and the importance of remaining in the ED until their evaluation is complete.     Flint Sonny POUR, PA-C 12/25/23 1108

## 2023-12-25 NOTE — Discharge Instructions (Addendum)
 Your workup was reassuring.  I spoke with the sickle cell team and they would like you to follow-up with their clinic tomorrow.  Please take your home medications as prescribed in the meantime.  Please follow up with your doctor and return to the ER for worsening symptoms.

## 2023-12-26 ENCOUNTER — Other Ambulatory Visit (HOSPITAL_COMMUNITY): Payer: Self-pay

## 2023-12-26 MED ORDER — OXYCODONE-ACETAMINOPHEN 10-325 MG PO TABS
1.0000 | ORAL_TABLET | ORAL | 0 refills | Status: DC | PRN
  Filled 2023-12-31: qty 90, 15d supply, fill #0

## 2023-12-26 NOTE — Telephone Encounter (Signed)
 Please advise [facility]

## 2023-12-26 NOTE — Telephone Encounter (Signed)
 Reviewed PDMP substance reporting system prior to prescribing opiate medications. No inconsistencies noted.   1. Sickle cell disease without crisis (HCC)  - oxyCODONE-acetaminophen (PERCOCET) 10-325 MG tablet; Take 1 tablet by mouth every 4 (four) hours as needed for up to 15 days for pain.  Dispense: 90 tablet; Refill: 0  2. Chronic pain syndrome  - oxyCODONE-acetaminophen (PERCOCET) 10-325 MG tablet; Take 1 tablet by mouth every 4 (four) hours as needed for up to 15 days for pain.  Dispense: 90 tablet; Refill: 0

## 2023-12-30 ENCOUNTER — Emergency Department (HOSPITAL_COMMUNITY)
Admission: EM | Admit: 2023-12-30 | Discharge: 2023-12-30 | Disposition: A | Discharge status: Home / Self Care | Source: Home / Self Care | Attending: Emergency Medicine | Admitting: Emergency Medicine

## 2023-12-30 ENCOUNTER — Other Ambulatory Visit: Payer: Self-pay

## 2023-12-30 ENCOUNTER — Encounter (HOSPITAL_COMMUNITY): Payer: Self-pay

## 2023-12-30 ENCOUNTER — Emergency Department (HOSPITAL_COMMUNITY)

## 2023-12-30 ENCOUNTER — Emergency Department (HOSPITAL_COMMUNITY)
Admission: EM | Admit: 2023-12-30 | Discharge: 2023-12-30 | Discharge status: Against Medical Advice | Attending: Emergency Medicine | Admitting: Emergency Medicine

## 2023-12-30 DIAGNOSIS — D72829 Elevated white blood cell count, unspecified: Secondary | ICD-10-CM | POA: Diagnosis not present

## 2023-12-30 DIAGNOSIS — D5700 Hb-SS disease with crisis, unspecified: Secondary | ICD-10-CM | POA: Diagnosis not present

## 2023-12-30 DIAGNOSIS — M791 Myalgia, unspecified site: Secondary | ICD-10-CM | POA: Diagnosis not present

## 2023-12-30 DIAGNOSIS — Z7901 Long term (current) use of anticoagulants: Secondary | ICD-10-CM | POA: Insufficient documentation

## 2023-12-30 DIAGNOSIS — G894 Chronic pain syndrome: Secondary | ICD-10-CM | POA: Diagnosis not present

## 2023-12-30 DIAGNOSIS — R079 Chest pain, unspecified: Secondary | ICD-10-CM | POA: Diagnosis not present

## 2023-12-30 DIAGNOSIS — Z5321 Procedure and treatment not carried out due to patient leaving prior to being seen by health care provider: Secondary | ICD-10-CM | POA: Diagnosis not present

## 2023-12-30 DIAGNOSIS — R0789 Other chest pain: Secondary | ICD-10-CM | POA: Diagnosis not present

## 2023-12-30 DIAGNOSIS — M545 Low back pain, unspecified: Secondary | ICD-10-CM | POA: Diagnosis present

## 2023-12-30 LAB — CBC WITH DIFFERENTIAL/PLATELET
Abs Immature Granulocytes: 0.27 K/uL — ABNORMAL HIGH (ref 0.00–0.07)
Basophils Absolute: 0.1 K/uL (ref 0.0–0.1)
Basophils Relative: 0 %
Eosinophils Absolute: 0.3 K/uL (ref 0.0–0.5)
Eosinophils Relative: 2 %
HCT: 26.5 % — ABNORMAL LOW (ref 39.0–52.0)
Hemoglobin: 9.4 g/dL — ABNORMAL LOW (ref 13.0–17.0)
Immature Granulocytes: 2 %
Lymphocytes Relative: 15 %
Lymphs Abs: 2.8 K/uL (ref 0.7–4.0)
MCH: 30.4 pg (ref 26.0–34.0)
MCHC: 35.5 g/dL (ref 30.0–36.0)
MCV: 85.8 fL (ref 80.0–100.0)
Monocytes Absolute: 1.1 K/uL — ABNORMAL HIGH (ref 0.1–1.0)
Monocytes Relative: 6 %
Neutro Abs: 13.9 K/uL — ABNORMAL HIGH (ref 1.7–7.7)
Neutrophils Relative %: 75 %
Platelets: 419 K/uL — ABNORMAL HIGH (ref 150–400)
RBC: 3.09 MIL/uL — ABNORMAL LOW (ref 4.22–5.81)
RDW: 20.3 % — ABNORMAL HIGH (ref 11.5–15.5)
WBC: 18.4 K/uL — ABNORMAL HIGH (ref 4.0–10.5)
nRBC: 3.5 % — ABNORMAL HIGH (ref 0.0–0.2)

## 2023-12-30 LAB — COMPREHENSIVE METABOLIC PANEL WITH GFR
ALT: 6 U/L (ref 0–44)
AST: 34 U/L (ref 15–41)
Albumin: 4.5 g/dL (ref 3.5–5.0)
Alkaline Phosphatase: 84 U/L (ref 38–126)
Anion gap: 10 (ref 5–15)
BUN: 7 mg/dL (ref 6–20)
CO2: 27 mmol/L (ref 22–32)
Calcium: 9.4 mg/dL (ref 8.9–10.3)
Chloride: 104 mmol/L (ref 98–111)
Creatinine, Ser: 0.75 mg/dL (ref 0.61–1.24)
GFR, Estimated: >60 mL/min (ref >60)
Glucose, Bld: 90 mg/dL (ref 70–99)
Potassium: 3.8 mmol/L (ref 3.5–5.1)
Sodium: 142 mmol/L (ref 135–145)
Total Bilirubin: 2.4 mg/dL — ABNORMAL HIGH (ref 0.0–1.2)
Total Protein: 8.4 g/dL — ABNORMAL HIGH (ref 6.5–8.1)

## 2023-12-30 LAB — RETICULOCYTES
Immature Retic Fract: 30.9 % — ABNORMAL HIGH (ref 2.3–15.9)
RBC.: 3.1 MIL/uL — ABNORMAL LOW (ref 4.22–5.81)
Retic Count, Absolute: 398.5 K/uL — ABNORMAL HIGH (ref 19.0–186.0)
Retic Ct Pct: 12.9 % — ABNORMAL HIGH (ref 0.4–3.1)

## 2023-12-30 LAB — TROPONIN T, HIGH SENSITIVITY: Troponin T High Sensitivity: <15 ng/L (ref 0–19)

## 2023-12-30 MED ORDER — HYDROMORPHONE HCL 1 MG/ML IJ SOLN
2.0000 mg | Freq: Once | INTRAMUSCULAR | Status: AC
  Administered 2023-12-30: 2 mg via INTRAVENOUS
  Filled 2023-12-30: qty 2

## 2023-12-30 MED ORDER — HYDROMORPHONE HCL 1 MG/ML IJ SOLN
2.0000 mg | INTRAMUSCULAR | Status: DC
  Filled 2023-12-30: qty 2

## 2023-12-30 MED ORDER — HYDROMORPHONE HCL 1 MG/ML IJ SOLN
2.0000 mg | INTRAMUSCULAR | Status: AC
  Administered 2023-12-30: 2 mg via INTRAVENOUS

## 2023-12-30 MED ORDER — HYDROMORPHONE HCL 1 MG/ML IJ SOLN
2.0000 mg | INTRAMUSCULAR | Status: AC
  Administered 2023-12-30: 2 mg via INTRAVENOUS
  Filled 2023-12-30: qty 2

## 2023-12-30 MED ORDER — OXYCODONE-ACETAMINOPHEN 5-325 MG PO TABS
1.0000 | ORAL_TABLET | Freq: Once | ORAL | Status: AC
  Administered 2023-12-30: 1 via ORAL
  Filled 2023-12-30: qty 1

## 2023-12-30 MED ORDER — DIPHENHYDRAMINE HCL 25 MG PO CAPS
25.0000 mg | ORAL_CAPSULE | ORAL | Status: DC | PRN
  Administered 2023-12-30: 25 mg via ORAL
  Filled 2023-12-30: qty 1

## 2023-12-30 MED ORDER — OXYCODONE HCL 5 MG PO TABS
5.0000 mg | ORAL_TABLET | Freq: Once | ORAL | Status: AC
  Administered 2023-12-30: 5 mg via ORAL
  Filled 2023-12-30: qty 1

## 2023-12-30 MED ORDER — HYDROMORPHONE HCL 1 MG/ML IJ SOLN: 1.0000 mg | Freq: Once | INTRAMUSCULAR | Status: DC

## 2023-12-30 MED ORDER — ONDANSETRON HCL 4 MG/2ML IJ SOLN
4.0000 mg | INTRAMUSCULAR | Status: DC | PRN
Start: 2023-12-30 — End: 2023-12-31

## 2023-12-30 NOTE — Discharge Instructions (Signed)
 Your white blood cells were elevated today.  You should get this rechecked with your sickle cell care team.  If you develop fever, new or worsening or uncontrolled pain, or any other new/concerning symptoms then return to the ER.

## 2023-12-30 NOTE — ED Triage Notes (Signed)
 Pt reports all over body pain that started in his back this morning around 9am and has progressively became worse. He reports this feels like a sickle cell pain crisis.+ chest pain as well

## 2023-12-30 NOTE — ED Notes (Signed)
 Pt to Xray

## 2023-12-30 NOTE — ED Provider Notes (Signed)
 [place] EMERGENCY DEPARTMENT AT [facility] Provider Note   CSN: [id number] Arrival date & time: 12/30/23  1537     Patient presents with: Sickle Cell Pain Crisis   Timothy Marks is a 33 y.o. male.   HPI 33 year old male presents with a sickle cell pain crisis.  Pain started this morning with his low back and then now it is everywhere.  He denies any weakness or numbness in his legs or any incontinence.  This is similar to many prior pain crises to him.  He denies fever, cough, shortness of breath.  There is chest pain but he states he always has chest pain during a pain crisis.  He has been taking ibuprofen  for the pain.  Upon talking about his chronic pain meds, he has a refill ordered for his 10 mg Percocet but has recently run out and has not had an day or so.  Prior to Admission medications   Medication Sig Start Date End Date Taking? Authorizing Provider  apixaban  (ELIQUIS ) 5 MG TABS tablet Take 1 tablet (5 mg total) by mouth 2 (two) times daily. 08/30/23   Paseda, Folashade R, FNP  cetirizine (ZYRTEC) 10 MG tablet Take 10 mg by mouth daily.    [provider]  diphenhydrAMINE  (BENADRYL ) 25 mg capsule Take 1 capsule (25 mg total) by mouth every 6 (six) hours as needed for itching. 01/17/23   Paseda, Folashade R, FNP  ergocalciferol  (VITAMIN D2) 1.25 MG (50000 UT) capsule Take 1 capsule (50,000 Units total) by mouth once a week. 05/16/23     folic acid  (FOLVITE ) 1 MG tablet Take 1 tablet (1 mg total) by mouth daily. 08/30/23   Paseda, Folashade R, FNP  hydroxyurea  (HYDREA ) 500 MG capsule Take 1 capsule (500 mg total) by mouth 2 (two) times daily. May take with food to minimize GI side effects. 06/19/23   Paseda, Folashade R, FNP  ibuprofen  (ADVIL ) 800 MG tablet Take 1 tablet (800 mg total) by mouth every 8 (eight) hours as needed. 08/30/23   Paseda, Folashade R, FNP  naloxone  (NARCAN ) nasal spray 4 mg/0.1 mL Place 1 spray into the nose once. 09/04/22   [provider]  oxyCODONE -acetaminophen  (PERCOCET) 10-325 MG tablet Take 1 tablet by mouth every 4 (four) hours as needed for up to 15 days for pain. 12/31/23 01/15/24  Paseda, Folashade R, FNP    Allergies: Buprenorphine hcl, Levaquin  [levofloxacin ], Meperidine, Morphine , Morphine  and codeine, Tramadol, Vancomycin, and Zosyn [piperacillin sod-tazobactam so]    Review of Systems  Constitutional:  Negative for fever.  Respiratory:  Negative for cough and shortness of breath.   Cardiovascular:  Positive for chest pain.  Gastrointestinal:  Negative for abdominal pain and vomiting.  Musculoskeletal:  Positive for back pain and myalgias.  Neurological:  Negative for weakness and numbness.    Updated Vital Signs BP 118/71 (BP Location: Right Arm)   Pulse 89   Temp 98.1 F (36.7 C) (Oral)   Resp 16   Ht 6' 2 (1.88 m)   Wt 61.2 kg   SpO2 91%   BMI 17.33 kg/m   Physical Exam Vitals and nursing note reviewed.  Constitutional:      Appearance: He is well-developed. He is not ill-appearing or diaphoretic.  HENT:     Head: Normocephalic and atraumatic.  Cardiovascular:     Rate and Rhythm: Normal rate and regular rhythm.     Heart sounds: Normal heart sounds.  Pulmonary:     Effort: Pulmonary  effort is normal.     Breath sounds: Normal breath sounds.  Abdominal:     Palpations: Abdomen is soft.     Tenderness: There is no abdominal tenderness.  Skin:    General: Skin is warm and dry.  Neurological:     Mental Status: He is alert.     Comments: Equal strength and grossly normal sensation in BLE.     (all labs ordered are listed, but only abnormal results are displayed) Labs Reviewed  COMPREHENSIVE METABOLIC PANEL WITH GFR - Abnormal; Notable for the following components:      Result Value   Total Protein 8.4 (*)    Total Bilirubin 2.4 (*)    All other components within normal limits  CBC WITH DIFFERENTIAL/PLATELET - Abnormal; Notable for the following components:   WBC  18.4 (*)    RBC 3.09 (*)    Hemoglobin 9.4 (*)    HCT 26.5 (*)    RDW 20.3 (*)    Platelets 419 (*)    nRBC 3.5 (*)    Neutro Abs 13.9 (*)    Monocytes Absolute 1.1 (*)    Abs Immature Granulocytes 0.27 (*)    All other components within normal limits  RETICULOCYTES - Abnormal; Notable for the following components:   Retic Ct Pct 12.9 (*)    RBC. 3.10 (*)    Retic Count, Absolute 398.5 (*)    Immature Retic Fract 30.9 (*)    All other components within normal limits  TROPONIN T, HIGH SENSITIVITY    EKG: EKG Interpretation Date/Time:  Saturday December 30 2023 16:10:40 EST Ventricular Rate:  86 PR Interval:  133 QRS Duration:  88 QT Interval:  348 QTC Calculation: 417 R Axis:   74  Text Interpretation: Sinus rhythm Probable left atrial enlargement RSR' in V1 or V2, probably normal variant Left ventricular hypertrophy ST elev, probable normal early repol pattern no significant change since Dec 25 2023 Confirmed by Freddi Hamilton [phone]) on 12/30/2023 4:22:40 PM  Radiology: DG Chest 2 View Result Date: 12/30/2023 EXAM: 2 VIEW(S) XRAY OF THE CHEST 12/30/2023 04:34:00 PM COMPARISON: 12/25/2023 and previous. CLINICAL HISTORY: cp cp FINDINGS: LUNGS AND PLEURA: No focal pulmonary opacity. No pleural effusion. No pneumothorax. HEART AND MEDIASTINUM: No acute abnormality of the cardiac and mediastinal silhouettes. BONES AND SOFT TISSUES: Diffuse osseous sclerosis. Surgical clips upper abdomen. IMPRESSION: 1. No acute cardiopulmonary process. Electronically signed by: Dayne Hassell MD 12/30/2023 04:49 PM EST RP Workstation: [id number]     Procedures   Medications Ordered in the ED  diphenhydrAMINE  (BENADRYL ) capsule 25-50 mg (25 mg Oral Given 12/30/23 1933)  ondansetron  (ZOFRAN ) injection 4 mg (has no administration in time range)  oxyCODONE -acetaminophen  (PERCOCET/ROXICET) 5-325 MG per tablet 1 tablet (1 tablet Oral Given 12/30/23 1710)  oxyCODONE  (Oxy IR/ROXICODONE ) immediate  release tablet 5 mg (5 mg Oral Given 12/30/23 1711)  HYDROmorphone  (DILAUDID ) injection 2 mg (2 mg Intravenous Given 12/30/23 1756)  HYDROmorphone  (DILAUDID ) injection 2 mg (2 mg Intravenous Given 12/30/23 1843)  HYDROmorphone  (DILAUDID ) injection 2 mg (2 mg Intravenous Given 12/30/23 1930)                                    Medical Decision Making Amount and/or Complexity of Data Reviewed External Data Reviewed: notes. Labs: ordered.    Details: Normal troponin, leukocytosis Radiology: ordered and independent interpretation performed.    Details: No infiltrate ECG/medicine tests: ordered  and independent interpretation performed.    Details: No ischemia  Risk Prescription drug management.   Patient presents with an exacerbation of chronic pain versus sickle cell pain crisis.  He is feeling better after both his oral home oxycodone  as well as IV Dilaudid .  He did request ketamine  but I am a little worried this might be more pain seeking rather than truly helpful for sickle cell pain crisis in this scenario.  He does have a mildly worse hemoglobin and a mildly worse leukocytosis.  On reexamination he continues to deny any other symptoms or any infectious symptoms.  I did offer admission but he states his pain is better and he wants to go home.  I think acute chest is unlikely and he states he always gets chest pain like he is having today.  Think is reasonable to discharge home and have him follow-up with his sickle cell team.  He states he is due to get his oxycodone  refilled tomorrow.  Will discharge home with return precautions.     Final diagnoses:  Sickle cell pain crisis (HCC)  Chronic pain syndrome    ED Discharge Orders     None          Freddi Hamilton, MD 12/30/23 2308

## 2023-12-30 NOTE — ED Notes (Signed)
 Pt requested to be taken off and wants to go to [facility]. Pt handed this NT labels and seen leaving WR with visitor.

## 2023-12-30 NOTE — ED Triage Notes (Signed)
 First Nurse Note: Pt with hx of SCC presents with generalized pain that started this AM. Denies ShOB, N/V.

## 2023-12-30 NOTE — ED Notes (Signed)
 IV attempt x 1 unsuccessful. ?

## 2023-12-31 ENCOUNTER — Other Ambulatory Visit (HOSPITAL_COMMUNITY): Payer: Self-pay

## 2024-01-01 ENCOUNTER — Telehealth: Payer: Self-pay

## 2024-01-01 DIAGNOSIS — E876 Hypokalemia: Secondary | ICD-10-CM | POA: Diagnosis not present

## 2024-01-01 DIAGNOSIS — D5700 Hb-SS disease with crisis, unspecified: Secondary | ICD-10-CM | POA: Diagnosis not present

## 2024-01-01 DIAGNOSIS — Z79899 Other long term (current) drug therapy: Secondary | ICD-10-CM | POA: Diagnosis not present

## 2024-01-01 DIAGNOSIS — Z7901 Long term (current) use of anticoagulants: Secondary | ICD-10-CM | POA: Diagnosis not present

## 2024-01-01 DIAGNOSIS — Z888 Allergy status to other drugs, medicaments and biological substances status: Secondary | ICD-10-CM | POA: Diagnosis not present

## 2024-01-01 DIAGNOSIS — Z885 Allergy status to narcotic agent status: Secondary | ICD-10-CM | POA: Diagnosis not present

## 2024-01-01 DIAGNOSIS — Z881 Allergy status to other antibiotic agents status: Secondary | ICD-10-CM | POA: Diagnosis not present

## 2024-01-01 NOTE — Telephone Encounter (Signed)
 Copied from CRM #[id number]. Topic: Clinical - Medical Advice >> Jan 01, 2024  7:38 AM Ivette P wrote: Reason for CRM: Pt called in to speak to sickle cell clinic, called 2 times and could not get through.   Pt would like  a call back as soon as possible. Please follow up with pt.  Pt was advised to go to ER

## 2024-01-03 ENCOUNTER — Other Ambulatory Visit (HOSPITAL_COMMUNITY): Payer: Self-pay

## 2024-01-03 ENCOUNTER — Ambulatory Visit (INDEPENDENT_AMBULATORY_CARE_PROVIDER_SITE_OTHER): Payer: Self-pay | Admitting: Nurse Practitioner

## 2024-01-03 ENCOUNTER — Encounter: Payer: Self-pay | Admitting: Nurse Practitioner

## 2024-01-03 ENCOUNTER — Other Ambulatory Visit: Payer: Self-pay

## 2024-01-03 VITALS — BP 108/61 | HR 95 | Wt 125.0 lb

## 2024-01-03 DIAGNOSIS — E876 Hypokalemia: Secondary | ICD-10-CM

## 2024-01-03 DIAGNOSIS — E559 Vitamin D deficiency, unspecified: Secondary | ICD-10-CM

## 2024-01-03 DIAGNOSIS — D571 Sickle-cell disease without crisis: Secondary | ICD-10-CM

## 2024-01-03 DIAGNOSIS — Z09 Encounter for follow-up examination after completed treatment for conditions other than malignant neoplasm: Secondary | ICD-10-CM | POA: Diagnosis not present

## 2024-01-03 MED ORDER — ERGOCALCIFEROL 1.25 MG (50000 UT) PO CAPS
1.0000 | ORAL_CAPSULE | ORAL | 1 refills | Status: AC
  Filled 2024-01-03: qty 12, 84d supply, fill #0

## 2024-01-03 NOTE — Assessment & Plan Note (Signed)
 Hospital chart reviewed, including discharge summary Medications reconciled and reviewed with the patient in detail

## 2024-01-03 NOTE — Progress Notes (Signed)
 Established Patient Office Visit  Subjective:  Patient ID: Timothy Marks, male    DOB: 11/21/1990  Age: 32 y.o. MRN: [id number]  CC:  Chief Complaint  Patient presents with   Hospitalization Follow-up    Scc. Still having right shoulder pain    HPI  Discussed the use of AI scribe software for clinical note transcription with the patient, who gave verbal consent to proceed.  History of Present Illness Timothy Marks is a 33 year old male with sickle cell disease who presents for a checkup.  He experiences ongoing pain in his right shoulder, rated as 7 out of 10. He is taking ibuprofen  800 mg every eight hours as needed and Percocet 10/325 mg, one tablet every four hours as needed, with the last dose taken last night.  He was recently in the emergency room on November 17th, where he was found to have a low potassium level of 3.3 mEq/L. He received fluids , IV and PO  potassium supplementation in the hospital but was not discharged with potassium pills.   He is currently taking folic acid  1 mg daily, hydroxyurea  500 mg twice daily, and Eliquis  consistently.  No fever, chills, chest pain, shortness of breath, abdominal pain, nausea, or vomiting. He lives in [place], [place] .    Assessment & Plan .      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   Sickle cell anemia (HCC) [birth year]   Vitamin D  deficiency 2015    Past Surgical History:  Procedure Laterality Date   CHOLECYSTECTOMY      Family History  Problem Relation Age of Onset   Sickle cell trait Mother    Sickle cell trait Father     Social History   Socioeconomic History   Marital status: Single    Spouse name: Not on file   Number of children: 2   Years of education: Not on file   Highest education  level: 12th grade  Occupational History   Not on file  Tobacco Use   Smoking status: Former    Current packs/day: 2.00    Average packs/day: 2.0 packs/day for 2.0 years (4.0 ttl pk-yrs)    Types: Cigarettes    Passive exposure: Past   Smokeless tobacco: Never   Tobacco comments:    Stress  Vaping Use   Vaping status: Never Used  Substance and Sexual Activity   Alcohol  use: No   Drug use: Yes    Frequency: 7.0 times per week    Types: Marijuana   Sexual activity: Yes    Birth control/protection: None  Other Topics Concern   Not on file  Social History Narrative   Lives with his mother    Social Drivers of Health   Financial Resource Strain: Low Risk  (12/01/2023)   Overall Financial Resource Strain (CARDIA)    Difficulty of Paying Living Expenses: Not hard at all  Food Insecurity: No Food Insecurity (12/01/2023)   Hunger Vital Sign    Worried About Running Out of Food in the Last Year: Never true    Ran Out of Food in the Last Year: Never true  Transportation Needs: No Transportation Needs (12/01/2023)   PRAPARE - Administrator, Civil Service (Medical): No    Lack of Transportation (  Non-Medical): No  Physical Activity: Insufficiently Active (12/01/2023)   Exercise Vital Sign    Days of Exercise per Week: 2 days    Minutes of Exercise per Session: 30 min  Stress: No Stress Concern Present (12/01/2023)   [employer] of Occupational Health - Occupational Stress Questionnaire    Feeling of Stress: Only a little  Social Connections: Unknown (12/01/2023)   Social Connection and Isolation Panel    Frequency of Communication with Friends and Family: Once a week    Frequency of Social Gatherings with Friends and Family: Once a week    Attends Religious Services: Never    Database Administrator or Organizations: No    Attends Engineer, Structural: Not on file    Marital Status: Not on file  Intimate Partner Violence: Not At Risk (01/01/2024)    Received from [facility]   HITS    Over the last 12 months how often did your partner physically hurt you?: Never    Over the last 12 months how often did your partner insult you or talk down to you?: Never    Over the last 12 months how often did your partner threaten you with physical harm?: Never    Over the last 12 months how often did your partner scream or curse at you?: Never    Outpatient Medications Prior to Visit  Medication Sig Dispense Refill   apixaban  (ELIQUIS ) 5 MG TABS tablet Take 1 tablet (5 mg total) by mouth 2 (two) times daily. 120 tablet 3   cetirizine (ZYRTEC) 10 MG tablet Take 10 mg by mouth daily.     diphenhydrAMINE  (BENADRYL ) 25 mg capsule Take 1 capsule (25 mg total) by mouth every 6 (six) hours as needed for itching. 30 capsule 1   folic acid  (FOLVITE ) 1 MG tablet Take 1 tablet (1 mg total) by mouth daily. 90 tablet 2   hydroxyurea  (HYDREA ) 500 MG capsule Take 1 capsule (500 mg total) by mouth 2 (two) times daily. May take with food to minimize GI side effects. 180 capsule 2   ibuprofen  (ADVIL ) 800 MG tablet Take 1 tablet (800 mg total) by mouth every 8 (eight) hours as needed. 30 tablet 1   naloxone  (NARCAN ) nasal spray 4 mg/0.1 mL Place 1 spray into the nose once.     oxyCODONE -acetaminophen  (PERCOCET) 10-325 MG tablet Take 1 tablet by mouth every 4 (four) hours as needed for up to 15 days for pain. 90 tablet 0   ergocalciferol  (VITAMIN D2) 1.25 MG (50000 UT) capsule Take 1 capsule (50,000 Units total) by mouth once a week. 14 capsule 1   No facility-administered medications prior to visit.    Allergies  Allergen Reactions   Buprenorphine Hcl Hives   Levaquin  [Levofloxacin ] Itching   Meperidine Rash   Morphine  Hives    Takes MS IR at home   Morphine  And Codeine Hives    Takes MSIR PTA   Tramadol Hives   Vancomycin Itching   Zosyn [Piperacillin Sod-Tazobactam So] Itching, Rash and Other (See Comments)    Has taken rocephin  in the past     ROS Review of Systems  Constitutional:  Negative for appetite change, chills, fatigue and fever.  HENT:  Negative for congestion, postnasal drip, rhinorrhea and sneezing.   Respiratory:  Negative for cough, shortness of breath and wheezing.   Cardiovascular:  Negative for chest pain, palpitations and leg swelling.  Gastrointestinal:  Negative for abdominal pain, constipation, nausea and vomiting.  Genitourinary:  Negative for difficulty urinating, dysuria, flank pain and frequency.  Musculoskeletal:  Positive for arthralgias. Negative for joint swelling.  Skin:  Negative for color change, pallor, rash and wound.  Neurological:  Negative for dizziness, facial asymmetry, weakness, numbness and headaches.  Psychiatric/Behavioral:  Negative for behavioral problems, confusion, self-injury and suicidal ideas.       Objective:    Physical Exam Vitals and nursing note reviewed.  Constitutional:      General: He is not in acute distress.    Appearance: Normal appearance. He is not ill-appearing, toxic-appearing or diaphoretic.  HENT:     Mouth/Throat:     Pharynx: No posterior oropharyngeal erythema.  Eyes:     General:        Right eye: No discharge.        Left eye: No discharge.     Extraocular Movements: Extraocular movements intact.  Cardiovascular:     Rate and Rhythm: Normal rate and regular rhythm.     Pulses: Normal pulses.     Heart sounds: Normal heart sounds. No murmur heard.    No friction rub. No gallop.  Pulmonary:     Effort: Pulmonary effort is normal. No respiratory distress.     Breath sounds: Normal breath sounds. No stridor. No wheezing, rhonchi or rales.  Chest:     Chest wall: No tenderness.  Abdominal:     General: There is no distension.     Palpations: Abdomen is soft.     Tenderness: There is no abdominal tenderness. There is no right CVA tenderness, left CVA tenderness or guarding.  Musculoskeletal:        General: Tenderness present. No swelling,  deformity or signs of injury.     Right lower leg: No edema.     Left lower leg: No edema.     Comments: Tenderness: Range of motion of right shoulder, no redness or swelling noted, skin is warm and dry has palpable radial pulse  Skin:    General: Skin is warm and dry.     Capillary Refill: Capillary refill takes less than 2 seconds.     Coloration: Skin is not jaundiced or pale.     Findings: No bruising, erythema or lesion.  Neurological:     Mental Status: He is alert and oriented to person, place, and time.     Motor: No weakness.     Coordination: Coordination normal.     Gait: Gait normal.  Psychiatric:        Mood and Affect: Mood normal.        Behavior: Behavior normal.        Thought Content: Thought content normal.        Judgment: Judgment normal.     BP 108/61   Pulse 95   Wt 125 lb (56.7 kg)   SpO2 100%   BMI 16.05 kg/m  Wt Readings from Last 3 Encounters:  01/03/24 125 lb (56.7 kg)  12/30/23 135 lb (61.2 kg)  12/22/23 135 lb (61.2 kg)    Lab Results  Component Value Date   TSH 0.884 08/31/2018   Lab Results  Component Value Date   WBC 18.4 (H) 12/30/2023   HGB 9.4 (L) 12/30/2023   HCT 26.5 (L) 12/30/2023   MCV 85.8 12/30/2023   PLT 419 (H) 12/30/2023   Lab Results  Component Value Date   NA 142 12/30/2023   K 3.8 12/30/2023   CO2 27 12/30/2023   GLUCOSE 90 12/30/2023   BUN  7 12/30/2023   CREATININE 0.75 12/30/2023   BILITOT 2.4 (H) 12/30/2023   ALKPHOS 84 12/30/2023   AST 34 12/30/2023   ALT 6 12/30/2023   PROT 8.4 (H) 12/30/2023   ALBUMIN 4.5 12/30/2023   CALCIUM 9.4 12/30/2023   ANIONGAP 10 12/30/2023   EGFR  12/09/2023     Comment:     >90 - Abstracted by HIM   Lab Results  Component Value Date   CHOL 148 12/04/2023   Lab Results  Component Value Date   HDL 42 12/04/2023   Lab Results  Component Value Date   LDLCALC 81 12/04/2023   Lab Results  Component Value Date   TRIG 140 12/04/2023   Lab Results  Component Value  Date   CHOLHDL 3.5 12/04/2023   No results found for: HGBA1C    Assessment & Plan:   Problem List Items Addressed This Visit       Other   Sickle cell anemia (HCC) - Primary (Chronic)   Sickle cell disease with chronic pain syndrome and right shoulder pain 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 No fever, chills, chest pain, or shortness of breath. Recent hospital visit for pain management and hypokalemia. - Continue ibuprofen  800 mg every 8 hours as needed for pain. - Continue oxycodone -acetaminophen  (Percocet) 10-325 mg every 4 hours as needed for pain. We discussed the need for good hydration, monitoring of hydration status, avoidance of heat, cold, stress, and infection triggers. We discussed the need to be adherent with taking Hydrea , folic acid  and other home medications. Patient was reminded of the need to seek medical attention immediately if any symptom of bleeding, anemia, or infection occurs.   Declined shoulder XRAY       Relevant Orders   CBC   Basic Metabolic Panel   Vitamin D  deficiency (Chronic)   Last vitamin D  Lab Results  Component Value Date   VD25OH 53.25 11/20/2023  Continue vitamin D  50,000 units once weekly      Relevant Medications   ergocalciferol  (VITAMIN D2) 1.25 MG (50000 UT) capsule   Hypokalemia   Hypokalemia Recent hypokalemia with potassium at 3.3 mEq/L. Potassium supplementation provided at the hospital - Checked bmp today.        Relevant Orders   Basic Metabolic Panel   Encounter for examination following treatment at hospital   Hospital chart reviewed, including discharge summary Medications reconciled and reviewed with the patient in detail        Meds ordered this encounter  Medications   ergocalciferol  (VITAMIN D2) 1.25 MG (50000 UT) capsule    Sig: Take 1 capsule (50,000 Units total) by mouth once a week.    Dispense:  14 capsule     Refill:  1    Follow-up: No follow-ups on file.    Jeiry Birnbaum R Danyka Merlin, FNP

## 2024-01-03 NOTE — Assessment & Plan Note (Addendum)
 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 No fever, chills, chest pain, or shortness of breath. Recent hospital visit for pain management and hypokalemia. - Continue ibuprofen  800 mg every 8 hours as needed for pain. - Continue oxycodone -acetaminophen  (Percocet) 10-325 mg every 4 hours as needed for pain. We discussed the need for good hydration, monitoring of hydration status, avoidance of heat, cold, stress, and infection triggers. We discussed the need to be adherent with taking Hydrea , folic acid  and other home medications. Patient was reminded of the need to seek medical attention immediately if any symptom of bleeding, anemia, or infection occurs.   Declined shoulder XRAY

## 2024-01-03 NOTE — Assessment & Plan Note (Signed)
 Last vitamin D  Lab Results  Component Value Date   VD25OH 53.25 11/20/2023  Continue vitamin D  50,000 units once weekly

## 2024-01-03 NOTE — Patient Instructions (Signed)

## 2024-01-03 NOTE — Assessment & Plan Note (Addendum)
 Hypokalemia Recent hypokalemia with potassium at 3.3 mEq/L. Potassium supplementation provided at the hospital - Checked bmp today.

## 2024-01-04 LAB — CBC
Hematocrit: 23.5 % — ABNORMAL LOW (ref 37.5–51.0)
Hemoglobin: 7.5 g/dL — ABNORMAL LOW (ref 13.0–17.7)
MCH: 30.2 pg (ref 26.6–33.0)
MCHC: 31.9 g/dL (ref 31.5–35.7)
MCV: 95 fL (ref 79–97)
NRBC: 9 % — ABNORMAL HIGH (ref 0–0)
Platelets: 497 x10E3/uL — ABNORMAL HIGH (ref 150–450)
RBC: 2.48 x10E6/uL — CL (ref 4.14–5.80)
RDW: 19.8 % — ABNORMAL HIGH (ref 11.6–15.4)
WBC: 9.3 x10E3/uL (ref 3.4–10.8)

## 2024-01-04 LAB — BASIC METABOLIC PANEL WITH GFR
BUN/Creatinine Ratio: 11 (ref 9–20)
BUN: 7 mg/dL (ref 6–20)
CO2: 25 mmol/L (ref 20–29)
Calcium: 9 mg/dL (ref 8.7–10.2)
Chloride: 101 mmol/L (ref 96–106)
Creatinine, Ser: 0.62 mg/dL — ABNORMAL LOW (ref 0.76–1.27)
Glucose: 87 mg/dL (ref 70–99)
Potassium: 3.8 mmol/L (ref 3.5–5.2)
Sodium: 139 mmol/L (ref 134–144)
eGFR: 128 mL/min/1.73 (ref >59)

## 2024-01-05 ENCOUNTER — Ambulatory Visit: Payer: Self-pay | Admitting: Nurse Practitioner

## 2024-01-10 ENCOUNTER — Other Ambulatory Visit: Payer: Self-pay | Admitting: Nurse Practitioner

## 2024-01-10 ENCOUNTER — Other Ambulatory Visit (HOSPITAL_COMMUNITY): Payer: Self-pay

## 2024-01-10 DIAGNOSIS — D571 Sickle-cell disease without crisis: Secondary | ICD-10-CM

## 2024-01-10 DIAGNOSIS — G894 Chronic pain syndrome: Secondary | ICD-10-CM

## 2024-01-10 MED ORDER — OXYCODONE-ACETAMINOPHEN 10-325 MG PO TABS
1.0000 | ORAL_TABLET | ORAL | 0 refills | Status: DC | PRN
  Filled 2024-01-15: qty 90, 15d supply, fill #0

## 2024-01-12 ENCOUNTER — Other Ambulatory Visit (HOSPITAL_COMMUNITY): Payer: Self-pay

## 2024-01-15 ENCOUNTER — Other Ambulatory Visit (HOSPITAL_COMMUNITY): Payer: Self-pay

## 2024-01-23 ENCOUNTER — Other Ambulatory Visit (HOSPITAL_COMMUNITY): Payer: Self-pay

## 2024-01-23 ENCOUNTER — Other Ambulatory Visit: Payer: Self-pay | Admitting: Nurse Practitioner

## 2024-01-23 DIAGNOSIS — G894 Chronic pain syndrome: Secondary | ICD-10-CM

## 2024-01-23 DIAGNOSIS — D571 Sickle-cell disease without crisis: Secondary | ICD-10-CM

## 2024-01-23 MED ORDER — OXYCODONE-ACETAMINOPHEN 10-325 MG PO TABS
1.0000 | ORAL_TABLET | ORAL | 0 refills | Status: DC | PRN
  Filled 2024-01-30: qty 90, 15d supply, fill #0

## 2024-01-23 NOTE — Telephone Encounter (Signed)
 Please advise [facility]

## 2024-01-23 NOTE — Telephone Encounter (Signed)
 1. Sickle cell disease without crisis (HCC) - oxyCODONE -acetaminophen  (PERCOCET) 10-325 MG tablet; Take 1 tablet by mouth every 4 (four) hours as needed for up to 15 days for pain.  Dispense: 90 tablet; Refill: 0  2. Chronic pain syndrome - oxyCODONE -acetaminophen  (PERCOCET) 10-325 MG tablet; Take 1 tablet by mouth every 4 (four) hours as needed for up to 15 days for pain.  Dispense: 90 tablet; Refill: 0

## 2024-01-29 ENCOUNTER — Other Ambulatory Visit (HOSPITAL_COMMUNITY): Payer: Self-pay

## 2024-01-30 ENCOUNTER — Other Ambulatory Visit (HOSPITAL_COMMUNITY): Payer: Self-pay

## 2024-02-05 ENCOUNTER — Other Ambulatory Visit: Payer: Self-pay | Admitting: Nurse Practitioner

## 2024-02-05 DIAGNOSIS — G894 Chronic pain syndrome: Secondary | ICD-10-CM

## 2024-02-05 DIAGNOSIS — D571 Sickle-cell disease without crisis: Secondary | ICD-10-CM

## 2024-02-05 MED ORDER — OXYCODONE-ACETAMINOPHEN 10-325 MG PO TABS
1.0000 | ORAL_TABLET | ORAL | 0 refills | Status: DC | PRN
  Filled 2024-02-14: qty 90, 15d supply, fill #0

## 2024-02-06 ENCOUNTER — Other Ambulatory Visit (HOSPITAL_COMMUNITY): Payer: Self-pay

## 2024-02-13 ENCOUNTER — Non-Acute Institutional Stay (HOSPITAL_COMMUNITY)
Admission: AD | Admit: 2024-02-13 | Discharge: 2024-02-13 | Disposition: A | Discharge status: Home / Self Care | Source: Ambulatory Visit | Attending: Internal Medicine | Admitting: Internal Medicine

## 2024-02-13 ENCOUNTER — Telehealth (HOSPITAL_COMMUNITY): Payer: Self-pay | Admitting: *Deleted

## 2024-02-13 DIAGNOSIS — M879 Osteonecrosis, unspecified: Secondary | ICD-10-CM | POA: Diagnosis not present

## 2024-02-13 DIAGNOSIS — M545 Low back pain, unspecified: Secondary | ICD-10-CM | POA: Insufficient documentation

## 2024-02-13 DIAGNOSIS — D5700 Hb-SS disease with crisis, unspecified: Secondary | ICD-10-CM | POA: Diagnosis present

## 2024-02-13 DIAGNOSIS — Z79899 Other long term (current) drug therapy: Secondary | ICD-10-CM | POA: Diagnosis not present

## 2024-02-13 DIAGNOSIS — Z86718 Personal history of other venous thrombosis and embolism: Secondary | ICD-10-CM | POA: Insufficient documentation

## 2024-02-13 DIAGNOSIS — D638 Anemia in other chronic diseases classified elsewhere: Secondary | ICD-10-CM | POA: Insufficient documentation

## 2024-02-13 DIAGNOSIS — G894 Chronic pain syndrome: Secondary | ICD-10-CM | POA: Diagnosis not present

## 2024-02-13 DIAGNOSIS — F32A Depression, unspecified: Secondary | ICD-10-CM | POA: Diagnosis not present

## 2024-02-13 DIAGNOSIS — Z832 Family history of diseases of the blood and blood-forming organs and certain disorders involving the immune mechanism: Secondary | ICD-10-CM | POA: Diagnosis not present

## 2024-02-13 DIAGNOSIS — F419 Anxiety disorder, unspecified: Secondary | ICD-10-CM | POA: Diagnosis not present

## 2024-02-13 DIAGNOSIS — Z7901 Long term (current) use of anticoagulants: Secondary | ICD-10-CM | POA: Insufficient documentation

## 2024-02-13 MED ORDER — SENNOSIDES-DOCUSATE SODIUM 8.6-50 MG PO TABS: 1.0000 | ORAL_TABLET | Freq: Two times a day (BID) | ORAL | Status: DC

## 2024-02-13 MED ORDER — HYDROMORPHONE 1 MG/ML IV SOLN
INTRAVENOUS | Status: DC
  Administered 2024-02-13: 30 mg via INTRAVENOUS
  Administered 2024-02-13: 3.5 mg via INTRAVENOUS
  Filled 2024-02-13: qty 30

## 2024-02-13 MED ORDER — ONDANSETRON HCL 4 MG/2ML IJ SOLN: 4.0000 mg | Freq: Four times a day (QID) | INTRAMUSCULAR | Status: DC | PRN

## 2024-02-13 MED ORDER — NALOXONE HCL 0.4 MG/ML IJ SOLN: 0.4000 mg | INTRAMUSCULAR | Status: DC | PRN

## 2024-02-13 MED ORDER — SODIUM CHLORIDE 0.9% FLUSH: 9.0000 mL | INTRAVENOUS | Status: DC | PRN

## 2024-02-13 MED ORDER — DIPHENHYDRAMINE HCL 25 MG PO CAPS: 25.0000 mg | ORAL_CAPSULE | ORAL | Status: DC | PRN

## 2024-02-13 MED ORDER — ACETAMINOPHEN 500 MG PO TABS
1000.0000 mg | ORAL_TABLET | Freq: Once | ORAL | Status: AC
  Administered 2024-02-13: 1000 mg via ORAL
  Filled 2024-02-13: qty 2

## 2024-02-13 MED ORDER — POLYETHYLENE GLYCOL 3350 17 G PO PACK: 17.0000 g | PACK | Freq: Every day | ORAL | Status: DC | PRN

## 2024-02-13 MED ORDER — KETOROLAC TROMETHAMINE 15 MG/ML IJ SOLN
15.0000 mg | Freq: Four times a day (QID) | INTRAMUSCULAR | Status: DC
  Administered 2024-02-13: 15 mg via INTRAVENOUS
  Filled 2024-02-13: qty 1

## 2024-02-13 MED ORDER — SODIUM CHLORIDE 0.45 % IV SOLN: INTRAVENOUS | Status: DC

## 2024-02-13 NOTE — Discharge Summary (Signed)
 " Physician Discharge Summary  IZAAK SAHR FMW:[id number] DOB: [birth year] DOA: 02/13/2024  PCP: Paseda, Folashade R, FNP  Admit date: 02/13/2024  Discharge date: 02/13/2024  Time spent: 30 minutes  Discharge Diagnoses:  Principal Problem:   Sickle cell anemia with pain [facility])   Discharge Condition: Stable  Diet recommendation: Regular  History of present illness:  Timothy Marks is a 33 y.o. male with history of sickle cell disease, DVT on Eliquis , anxiety and depression, avascular necrosis of the left shoulder, anemia of chronic disease, and chronic pain syndrome who presented to day hospital for evaluation of sickle cell pain. Patient states he has had low back pain, his home pain medications did not provide any significant relief.   Hospital Course:  KWABENA STRUTZ was admitted to the day hospital with sickle cell painful crisis. Patient was treated with IV fluid, weight based IV Dilaudid  PCA, clinician assisted doses as deemed appropriate, and other adjunct therapies per sickle cell pain management protocol. Yale showed improvement symptomatically, pain improved from 9/10 to 4/10 at the time of discharge. Patient was discharged home in a hemodynamically stable condition. Manav will follow-up at the clinic as previously scheduled, continue with home medications as per prior to admission.  Discharge Instructions We discussed the need for good hydration, monitoring of hydration status, avoidance of heat, cold, stress, and infection triggers. We discussed the need to be compliant with taking hie home medications. Lundon was reminded of the need to seek medical attention immediately if any symptom of bleeding, anemia, or infection occurs.  Discharge Exam: Vitals:   02/13/24 1115 02/13/24 1223  BP:  (!) 110/56  Pulse:  73  Resp: 16 15  Temp:  98.8 F (37.1 C)  SpO2:  100%    General appearance: alert, cooperative and no distress Eyes: conjunctivae/corneas clear.  PERRL, EOM's intact. Fundi benign. Neck: no adenopathy, no carotid bruit, no JVD, supple, symmetrical, trachea midline and thyroid  not enlarged, symmetric, no tenderness/mass/nodules Back: symmetric, no curvature. ROM normal. No CVA tenderness. Resp: clear to auscultation bilaterally Chest wall: no tenderness Cardio: regular rate and rhythm, S1, S2 normal, no murmur, click, rub or gallop GI: soft, non-tender; bowel sounds normal; no masses,  no organomegaly Extremities: extremities normal, atraumatic, no cyanosis or edema Pulses: 2+ and symmetric Skin: Skin color, texture, turgor normal. No rashes or lesions Neurologic: Grossly normal  Discharge Instructions     Call MD for:  persistant nausea and vomiting   Complete by: As directed    Call MD for:  severe uncontrolled pain   Complete by: As directed    Call MD for:  temperature >100.4   Complete by: As directed    Increase activity slowly   Complete by: As directed       Allergies as of 02/13/2024       Reactions   Buprenorphine Hcl Hives   Levaquin  [levofloxacin ] Itching   Meperidine Rash   Morphine  Hives   Takes MS IR at home   Morphine  And Codeine Hives   Takes MSIR PTA   Tramadol Hives   Vancomycin Itching   Zosyn [piperacillin Sod-tazobactam So] Itching, Rash, Other (See Comments)   Has taken rocephin  in the past        Medication List     TAKE these medications    cetirizine 10 MG tablet Commonly known as: ZYRTEC Take 10 mg by mouth daily.   diphenhydrAMINE  25 mg capsule Commonly known as: BENADRYL  Take 1 capsule (25 mg total) by  mouth every 6 (six) hours as needed for itching.   Eliquis  5 MG Tabs tablet Generic drug: apixaban  Take 1 tablet (5 mg total) by mouth 2 (two) times daily.   ergocalciferol  1.25 MG (50000 UT) capsule Commonly known as: VITAMIN D2 Take 1 capsule (50,000 Units total) by mouth once a week.   folic acid  1 MG tablet Commonly known as: FOLVITE  Take 1 tablet (1 mg total) by  mouth daily.   hydroxyurea  500 MG capsule Commonly known as: HYDREA  Take 1 capsule (500 mg total) by mouth 2 (two) times daily. May take with food to minimize GI side effects.   ibuprofen  800 MG tablet Commonly known as: ADVIL  Take 1 tablet (800 mg total) by mouth every 8 (eight) hours as needed.   naloxone  4 MG/0.1ML Liqd nasal spray kit Commonly known as: NARCAN  Place 1 spray into the nose once.   oxyCODONE -acetaminophen  10-325 MG tablet Commonly known as: PERCOCET Take 1 tablet by mouth every 4 (four) hours as needed for up to 15 days for pain. Start taking on: February 14, 2024       Allergies[1]   Significant Diagnostic Studies: No results found.  Signed:  Homer CHRISTELLA Cover NP   02/13/2024, 1:41 PM     [1]  Allergies Allergen Reactions   Buprenorphine Hcl Hives   Levaquin  [Levofloxacin ] Itching   Meperidine Rash   Morphine  Hives    Takes MS IR at home   Morphine  And Codeine Hives    Takes MSIR PTA   Tramadol Hives   Vancomycin Itching   Zosyn [Piperacillin Sod-Tazobactam So] Itching, Rash and Other (See Comments)    Has taken rocephin  in the past   "

## 2024-02-13 NOTE — H&P (Signed)
 " [facility] History and Physical  Timothy Marks FMW:[id number] DOB: 1990/04/14 DOA: 02/13/2024  PCP: Paseda, Folashade R, FNP   Chief Complaint: No chief complaint on file.   HPI: Timothy Marks is a 33 y.o. male with history of sickle cell disease, DVT on Eliquis , anxiety and depression, avascular necrosis of the left shoulder, anemia of chronic disease, and chronic pain syndrome who presented to day hospital for evaluation of sickle cell pain. Patient states he has had low back pain, his home pain medications did not provide any significant relief.   Systemic Review: General: The patient denies anorexia, fever, weight loss Cardiac: Denies chest pain, syncope, palpitations, pedal edema  Respiratory: Denies cough, shortness of breath, wheezing GI: Denies severe indigestion/heartburn, abdominal pain, nausea, vomiting, diarrhea and constipation GU: Denies hematuria, incontinence, dysuria  Musculoskeletal: Denies arthritis  Skin: Denies suspicious skin lesions Neurologic: Denies focal weakness or numbness, change in vision  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   Sickle cell anemia (HCC) September 27, 1990   Vitamin D  deficiency 2015    Past Surgical History:  Procedure Laterality Date   CHOLECYSTECTOMY      Allergies[1]  Family History  Problem Relation Age of Onset   Sickle cell trait Mother    Sickle cell trait Father       Prior to Admission medications  Medication Sig Start Date End Date Taking? Authorizing Provider  apixaban  (ELIQUIS ) 5 MG TABS tablet Take 1 tablet (5 mg total) by mouth 2 (two) times daily. 08/30/23   Paseda, Folashade R, FNP  cetirizine (ZYRTEC) 10 MG tablet Take 10 mg by mouth daily.    [provider]  diphenhydrAMINE  (BENADRYL )  25 mg capsule Take 1 capsule (25 mg total) by mouth every 6 (six) hours as needed for itching. 01/17/23   Paseda, Folashade R, FNP  ergocalciferol  (VITAMIN D2) 1.25 MG (50000 UT) capsule Take 1 capsule (50,000 Units total) by mouth once a week. 01/03/24   Paseda, Folashade R, FNP  folic acid  (FOLVITE ) 1 MG tablet Take 1 tablet (1 mg total) by mouth daily. 08/30/23   Paseda, Folashade R, FNP  hydroxyurea  (HYDREA ) 500 MG capsule Take 1 capsule (500 mg total) by mouth 2 (two) times daily. May take with food to minimize GI side effects. 06/19/23   Paseda, Folashade R, FNP  ibuprofen  (ADVIL ) 800 MG tablet Take 1 tablet (800 mg total) by mouth every 8 (eight) hours as needed. 08/30/23   Paseda, Folashade R, FNP  naloxone  (NARCAN ) nasal spray 4 mg/0.1 mL Place 1 spray into the nose once. 09/04/22   [provider]  oxyCODONE -acetaminophen  (PERCOCET) 10-325 MG tablet Take 1 tablet by mouth every 4 (four) hours as needed for up to 15 days for pain. 02/14/24 02/29/24  Paseda, Folashade R, FNP     Physical Exam: Vitals:   02/13/24 1115 02/13/24 1223  BP:  (!) 110/56  Pulse:  73  Resp: 16 15  Temp:  98.8 F (37.1 C)  TempSrc:  Temporal  SpO2:  100%    General: Alert, awake, afebrile, anicteric, not in obvious distress HEENT: Normocephalic and Atraumatic, Mucous membranes pink                PERRLA; EOM intact; No scleral icterus,  Nares: Patent, Oropharynx: Clear, Fair Dentition                 Neck: FROM, no cervical lymphadenopathy, thyromegaly, carotid bruit or JVD;  CHEST WALL: No tenderness  CHEST: Normal respiration, clear to auscultation bilaterally  HEART: Regular rate and rhythm; no murmurs rubs or gallops  BACK: No kyphosis or scoliosis; no CVA tenderness  ABDOMEN: Positive Bowel Sounds, soft, non-tender; no masses, no organomegaly EXTREMITIES: No cyanosis, clubbing, or edema SKIN:  no rash or ulceration  CNS: Alert and Oriented x 4, Nonfocal exam, CN 2-12  intact  Labs on Admission:  Basic Metabolic Panel: No results for input(s): NA, K, CL, CO2, GLUCOSE, BUN, CREATININE, CALCIUM, MG, PHOS in the last 168 hours. Liver Function Tests: No results for input(s): AST, ALT, ALKPHOS, BILITOT, PROT, ALBUMIN in the last 168 hours. No results for input(s): LIPASE, AMYLASE in the last 168 hours. No results for input(s): AMMONIA in the last 168 hours. CBC: No results for input(s): WBC, NEUTROABS, HGB, HCT, MCV, PLT in the last 168 hours. Cardiac Enzymes: No results for input(s): CKTOTAL, CKMB, CKMBINDEX, TROPONINI in the last 168 hours.  BNP (last 3 results) No results for input(s): BNP in the last 8760 hours.  ProBNP (last 3 results) No results for input(s): PROBNP in the last 8760 hours.  CBG: No results for input(s): GLUCAP in the last 168 hours.   Assessment/Plan Principal Problem:   Sickle cell anemia with pain (HCC)  Admits to the Day Hospital for extended observation IVF 0.45% Saline @ 125 mls/hour Weight based Dilaudid  PCA started within 30 minutes of admission Acetaminophen  1000 mg x 1 dose Labs: CBCD, CMP, Retic Count and LDH Monitor vitals very closely, Re-evaluate pain scale every hour 2 L of Oxygen  by [place] Patient will be re-evaluated for pain in the context of function and relationship to baseline as care progresses. If no significant relieve from pain (remains above 5/10) will transfer patient to inpatient services for further evaluation and management  Code Status: Full  Family Communication: None  DVT Prophylaxis: Ambulate as tolerated   Time spent: 35 Minutes  Homer CHRISTELLA Cover NP   If 7PM-7AM, please contact night-coverage [email or web address] 02/13/2024, 2:20 PM     [1]  Allergies Allergen Reactions   Buprenorphine Hcl Hives   Levaquin  [Levofloxacin ] Itching   Meperidine Rash   Morphine  Hives    Takes MS IR at home   Morphine  And Codeine Hives     Takes MSIR PTA   Tramadol Hives   Vancomycin Itching   Zosyn [Piperacillin Sod-Tazobactam So] Itching, Rash and Other (See Comments)    Has taken rocephin  in the past   "

## 2024-02-13 NOTE — Progress Notes (Addendum)
 Diagnosis:Sickle cell Disease    Provider:Onyeje, Ijaola, NP    Procedure: Dilaudid  PCA     Note:Patient came to day hospital for treatment of sickle cell crisis pain 10/10 in legs, low back.  Patient received IV hydration, Dilaudid  0.5/10/3.  patient states pain 4/10 upon discharge. Pt hydrated with IV fluids during stay . He is alert, oriented and ambulatory at discharge with significant other as driver.

## 2024-02-13 NOTE — Telephone Encounter (Signed)
 Patient called in. Complains of pain in lower back and left thigh rates 9/10. Denied chest pain, abd pain, fever, N/V/D. Wants to come in for treatment. Last took Percocet 10 mg  at 11 pm. Spoke with provider, he may come in to day hospital for treatment. Patient verbalizes understanding

## 2024-02-13 NOTE — Discharge Instructions (Signed)
 Timothy Marks

## 2024-02-14 ENCOUNTER — Other Ambulatory Visit (HOSPITAL_COMMUNITY): Payer: Self-pay

## 2024-02-20 ENCOUNTER — Other Ambulatory Visit: Payer: Self-pay | Admitting: Nurse Practitioner

## 2024-02-20 DIAGNOSIS — G894 Chronic pain syndrome: Secondary | ICD-10-CM

## 2024-02-20 DIAGNOSIS — D571 Sickle-cell disease without crisis: Secondary | ICD-10-CM

## 2024-02-20 NOTE — Telephone Encounter (Signed)
 Please advise [facility]

## 2024-02-21 ENCOUNTER — Other Ambulatory Visit (HOSPITAL_COMMUNITY): Payer: Self-pay

## 2024-02-21 ENCOUNTER — Other Ambulatory Visit: Payer: Self-pay

## 2024-02-21 ENCOUNTER — Other Ambulatory Visit: Payer: Self-pay | Admitting: Nurse Practitioner

## 2024-02-21 ENCOUNTER — Telehealth: Payer: Self-pay

## 2024-02-21 DIAGNOSIS — D571 Sickle-cell disease without crisis: Secondary | ICD-10-CM

## 2024-02-21 DIAGNOSIS — G894 Chronic pain syndrome: Secondary | ICD-10-CM

## 2024-02-21 MED ORDER — OXYCODONE-ACETAMINOPHEN 10-325 MG PO TABS
1.0000 | ORAL_TABLET | ORAL | 0 refills | Status: DC | PRN
  Filled 2024-02-29: qty 90, 15d supply, fill #0

## 2024-02-21 NOTE — Telephone Encounter (Signed)
oxyCODONE-acetaminophen (PERCOCET) 10-325 MG tablet  

## 2024-02-21 NOTE — Telephone Encounter (Signed)
 Unable to decline. Please advise if you will place as a future order. KH

## 2024-02-21 NOTE — Telephone Encounter (Signed)
 Copied from CRM [id number]. Topic: General - Other >> Feb 21, 2024  9:00 AM Treva T wrote: Reason for CRM: Pt states he is returning call to [place] in office.  Per chart review, no notes seen.  Pt requesting a return call, at [phone].  Pt is aware of same day call back.  Pt was advised we have seven days and request was sent to provider.

## 2024-02-22 ENCOUNTER — Other Ambulatory Visit: Payer: Self-pay

## 2024-02-22 ENCOUNTER — Other Ambulatory Visit (HOSPITAL_COMMUNITY): Payer: Self-pay

## 2024-02-28 ENCOUNTER — Other Ambulatory Visit (HOSPITAL_COMMUNITY): Payer: Self-pay

## 2024-02-29 ENCOUNTER — Other Ambulatory Visit (HOSPITAL_COMMUNITY): Payer: Self-pay

## 2024-02-29 ENCOUNTER — Other Ambulatory Visit: Payer: Self-pay

## 2024-03-09 ENCOUNTER — Other Ambulatory Visit: Payer: Self-pay | Admitting: Nurse Practitioner

## 2024-03-09 DIAGNOSIS — D571 Sickle-cell disease without crisis: Secondary | ICD-10-CM

## 2024-03-09 DIAGNOSIS — G894 Chronic pain syndrome: Secondary | ICD-10-CM

## 2024-03-11 ENCOUNTER — Other Ambulatory Visit (HOSPITAL_COMMUNITY): Payer: Self-pay

## 2024-03-11 MED ORDER — OXYCODONE-ACETAMINOPHEN 10-325 MG PO TABS
1.0000 | ORAL_TABLET | ORAL | 0 refills | Status: AC | PRN
  Filled 2024-03-15: qty 90, 15d supply, fill #0

## 2024-03-11 NOTE — Telephone Encounter (Signed)
 Please Advise.  CB.

## 2024-03-12 NOTE — ED Provider Notes (Signed)
 Patient placed in First Look pathway, seen and evaluated for chief complaint of pain associated with sickle cell which began earlier today. Pain in right arm, leg and lower back that are his usual areas of pain associated with his crises. Pertinent exam findings include appears uncomfortable, nontoxic, speaking without difficulty and answers questions appropriately.   Patient/parent counseled on process, plan, and necessity for staying for completing the evaluation.      Note By: Metta Lines, PA-C 6:20 PM   .[facility] Emergency Department Emergency Department Provider Note  This document was created using the aid of voice recognition Dragon dictation software.   History obtained from the: Patient  History   Chief Complaint  Patient presents with   Sickle Cell Pain Crisis    HPI  Timothy Marks. is a 34 y.o. male who presents to the ED with complaints of see above.    No LMP for male patient.  Past Medical History Medical History[1]  Past Surgical History Surgical History[2]  Medications These were reviewed. See nursing note for details.  Allergies Buprenorphine hcl, Meperidine, Morphine , Toradol , Tramadol, Levofloxacin , Ketorolac , Piperacillin-tazobactam, and Vancomycin  Family History Family History[3]  Social History Social History[4]  Review of Systems  Review of Systems  Physical Exam   Vitals:   03/12/24 1818  BP: (!) 92/54  BP Location: Left arm  Patient Position: Sitting  Pulse: 82  Resp: 17  Temp: 98.7 F (37.1 C)  TempSrc: Oral  SpO2: 99%  Weight: 56.7 kg (125 lb)  Height: 188 cm (6' 2)    Physical Exam Vitals signs and nursing note reviewed.  Constitutional:      General: Patient is not in acute distress. HENT:     Head: Normocephalic  Pulmonary:     Effort: Pulmonary effort is normal.  Psychiatric:        Behavior: Behavior normal.   Results  LABS Lab Results (last 24 hours)     ** No results found  for the last 24 hours. **       Radiology   Radiology Results (last 72 hours)     ** No results found for the last 72 hours. **       ED Course  Patient counseled on process, plan, and necessity for staying for completing the evaluation. Encouraged to stay. Patient's labs and imagining were ordered as appropriate for a provider and triage setting, further evaluation and treatment could have became necessary if the patient were to have stayed for further evaluation or waited for results to have been reviewed with the patient.   Medications Given in Emergency Department  Medications - No data to display  Medical Decision Making  Pt is a 34 y.o. male who presented with sickle cell pain.    Vital signs reviewed and patient was afebrile and hemodynamically stable. On initial evaluation, labs and imaging ordered to include CBC, CMP, reticulocyte count though patient left prior to obtaining labs   Patient eloped from emergency department prior to reevaluation from provider and discussion of lab results, imaging, diagnoses and plan of care. Last seen, patient remained stable and in no acute distress. Plan is patient eloped prior to complete evaluation.    ED Clinical Impression   1. Sickle-cell disease with pain    (CMD)           [1] Past Medical History: Diagnosis Date   Acute chest syndrome due to sickle cell crisis    (CMD) 11/08/2014  Acute chest syndrome(517.3)    Acute midline low back pain without sciatica 07/22/2017   Acute on chronic anemia 11/08/2014   Anxiety 09/15/2020   Asthma (CMD)    childhood   Attempted suicide    (CMD)    Chronic pain 03/14/2011   Community acquired pneumonia of left lower lobe of lung 04/09/2019   Depression    DVT (deep venous thrombosis) (CMD) 12/21/2017   Generalized weakness 03/31/2019   Hyperbilirubinemia 03/30/2014   Leucocytosis 06/12/2011   Osteomyelitis of left lower limb    (CMD)    Osteomyelitis of left shoulder  region    (CMD)    Pneumonia    Sickle cell anemia    (CMD)    Sickle cell anemia    (CMD)    Sickle cell crisis    (CMD) 10/08/2018   Sickle cell disease    (CMD)    SIRS (systemic inflammatory response syndrome)    (CMD) 02/10/2019   Thrombocythemia 05/31/2018   Transfusion history   [2] Past Surgical History: Procedure Laterality Date   CHOLECYSTECTOMY     Procedure: CHOLECYSTECTOMY   ORTHOPEDIC SURGERY Left    left arm bone infection surgery  [3] Family History Problem Relation Name Age of Onset   Sickle cell trait Father [name]    Sickle cell trait Mother Nena    Sickle cell trait Son     Sickle cell trait Sister    [4] Social History Tobacco Use   Smoking status: Former    Current packs/day: 0.25    Types: Cigarettes   Smokeless tobacco: Never  Substance Use Topics   Alcohol  use: No

## 2024-03-12 NOTE — ED Provider Notes (Signed)
 " [facility]  ED Provider Note  Timothy Marks. 34 y.o. male DOB: 06/15/1990 MRN: [id number] History   Chief Complaint  Patient presents with   Sickle Cell Pain Crisis    Pt wheeled to triage in sickle cell crisis. Pt took a percocet at 1200 to help with pain but no relief. States last crisis was quite awhile ago.    Patient is a 34 y.o. male presents to the Emergency Department complaining of sickle cell crisis.    Sickle Cell Pain Crisis Associated symptoms: no shortness of breath        Past Medical History:  Diagnosis Date   History of transfusion    2 units   Sickle cell disease (*)     Past Surgical History:  Procedure Laterality Date   Arm wound repair / closure Left    Mandible fracture surgery     whole mouth    Social History   Substance and Sexual Activity  Alcohol  Use No   Tobacco Use History[1] E-Cigarettes   Vaping Use     Start Date     Cartridges/Day     Quit Date     Social History   Substance and Sexual Activity  Drug Use No         Allergies[2]  Home Medications   APIXABAN  (ELIQUIS ) 5 MG TABLET    10 mg (2 tablets) oral 2 times a day x 1 days followed by 5 mg (1 tablet) 2 times a day for completion of therapy duration.   CHOLECALCIFEROL  25 MCG (1000 UT) TABLET    Take one tablet (1,000 Units dose) by mouth daily.   ERGOCALCIFEROL  50,000 UNITS CAPS CAPSULE    Take one capsule (50,000 Units dose) by mouth once a week at 0900.   FOLIC ACID  1 MG TABLET    Take one tablet (1 mg dose) by mouth daily.   GABAPENTIN  (NEURONTIN ) 300 MG CAPSULE    Take one capsule (300 mg dose) by mouth 3 (three) times a day.   HYDROXYUREA  (HYDREA ) 500 MG CAPSULE    Take one capsule (500 mg dose) by mouth 2 (two) times daily.   HYDROXYZINE  HCL (ATARAX ) 25 MG TABLET    Take one tablet (25 mg dose) by mouth 3 (three) times a day.   MIRTAZAPINE  (REMERON ) 7.5 MG TABLET    Take one tablet (7.5 mg dose) by mouth at bedtime  as needed.   NALOXONE  (NARCAN ) 4 MG/0.1 ML NASAL SPRAY    one spray by Nasal route once as needed for up to 1 dose.   OLANZAPINE  (ZYPREXA ) 5 MG TABLET    Take one tablet (5 mg dose) by mouth at bedtime as needed.   RIVAROXABAN  (XARELTO ) 20 MG TABS TABLET    Take one tablet (20 mg dose) by mouth with supper.    Primary Survey  Primary Survey  Review of Systems   Review of Systems  Respiratory:  Negative for chest tightness and shortness of breath.   Musculoskeletal:  Positive for arthralgias and back pain.    Physical Exam   ED Triage Vitals [03/12/24 1955]  BP (!) 121/52  Heart Rate 74  Resp 18  SpO2 99 %  Temp 100.3 F (37.9 C)    Physical Exam  Constitutional: He appears well-developed and well-nourished. He appears to be in pain.  Cardiovascular: Normal rate and regular rhythm.  Pulmonary/Chest: Respiratory effort normal and breath sounds normal.  Musculoskeletal: Normal range of motion.  Neurological: He is alert and oriented to person, place, and time. Moves all extremities equally. Gait normal. He has normal speech.  Skin: Skin is warm. Skin is dry.     ED Course   Lab results:   CBC - Abnormal      Result Value   WBC 12.7 (*)    RBC 3.75 (*)    HGB 11.3 (*)    HCT 32.9 (*)    MCV 87.7     MCH 30.1     MCHC 34.3     Plt Ct 360     RDW SD 62.6 (*)    MPV 8.8 (*)    NRBC% 1.0 (*)    Absolute NRBC Count 0.13 (*)   COMPREHENSIVE METABOLIC PANEL - Abnormal   Na 135 (*)    Potassium 3.8     Cl 98     CO2 25     AGAP 12     Glucose 107 (*)    BUN 7     Creatinine 0.72 (*)    Ca 9.5     ALK PHOS 94     T Bili 1.5 (*)    Total Protein 8.3     Alb 4.6     GLOBULIN 3.7     ALBUMIN/GLOBULIN RATIO 1.2     BUN/CREAT RATIO 10 (*)    ALT 15     AST 32     eGFR 122     Comment: Normal GFR (glomerular filtration rate) > 60 mL/min/1.73 meters squared, < 60 may include impaired kidney function. Calculation based on the Chronic Kidney Disease  Epidemiology Collaboration (CK-EPI)equation refit without adjustment for race.  RETICULOCYTE COUNT AUTOMATED - Abnormal   Reticulocyte %  7.31 (*)    Immature Reticulocyte Fraction  36.9 (*)    Reticulocyte Hemoglobin  31.6     Absolute Reticulocyte Count 0.2630 (*)    Narrative:    From 1:5 Dilution  Smear scanned.    Imaging:   XR CHEST AP PORTABLE   Narrative:    TECHNIQUE: XR CHEST AP PORTABLE  INDICATION: Chest Pain   COMPARISON: 02/11/2024  FINDINGS: Cardiomediastinal silhouette within normal limits.   No focal airspace consolidation. No pleural effusion or pneumothorax.   Unremarkable appearance of the upper abdomen.     Impression:    IMPRESSION: No acute pulmonary abnormality.   Electronically Signed by: Allison Kobus on 03/12/2024 9:07 PM     ECG: ECG Results   None                                                                        Pre-Sedation Procedures    Medical Decision Making [name]. is a 34 y.o. male with a history of sickle cell disease.  He presents emergency department with complaints of pain to his back, right arm, left leg.  This feels like his normal sickle cell pain.  He denies any chest pain, shortness of breath.  He is followed by the sickle cell clinic.  He states he took his home medication with no improvement of his symptoms     Differential Diagnosis including but not limited to: Sickle cell crisis, acute chest syndrome, chronic pain  Exam: Patient is alert and oriented does not appear septic or toxic on exam Vital Signs:  Patient is nontachycardic, mildly febrile, normotensive Labs: Hemoglobin is stable he does not appear acutely dehydrated.  His reticulocyte count is 7.3 Imaging: Chest x-ray shows no acute findings.   Low suspicion for acute chest, aplastic anemia or other acute findings to require admission. Lab work is reassuring and improved from previous.   Plan:  Patient given 2 mg Dilaudid , 25 Benadryl  (x2 rounds) 1 L IV fluids    Patient notes mild improvement after two rounds of pain medications.SABRA  He is agreeable to receiving another round of pain meds and following up with his primary care provider.      Amount and/or Complexity of Data Reviewed Labs: ordered. Radiology: ordered.  Risk Prescription drug management.          Provider Communication  New Prescriptions   No medications on file    Modified Medications   No medications on file    Discontinued Medications   No medications on file    Clinical Impression Final diagnoses:  Sickle cell crisis (*)    ED Disposition     ED Disposition  Discharge   Condition  Stable   Comment  --                 Follow-up Information     Schedule an appointment as soon as possible for a visit  with Olugbemiga Ebenezer Jegede, MD.   Specialty: Internal Medicine Contact information: [address] [place] [address] [phone]                  Electronically signed by:       [1] Social History Tobacco Use  Smoking Status Never  Smokeless Tobacco Never  [2] Allergies Allergen Reactions   Toradol  Itching and Rash    Patient states that he has had it safely before with benadryl    Vancomycin Itching and Rash   Zosyn Itching and Rash   Meperidine Other    rash   Morphine  Hives   Tramadol Hives and Other    Rash; Percocet okay.  Patient states he's had tramadol safely before with benadryl    Larraine Estefana Molt, NP 03/12/24 2329  "

## 2024-03-12 NOTE — ED Triage Notes (Signed)
 Pt complains of sickle cell pain  to R arm and lower back since earlier today.

## 2024-03-13 ENCOUNTER — Emergency Department (HOSPITAL_COMMUNITY)

## 2024-03-13 ENCOUNTER — Inpatient Hospital Stay (HOSPITAL_COMMUNITY)
Admission: EM | Admit: 2024-03-13 | Discharge: 2024-03-21 | Disposition: A | Source: Home / Self Care | Attending: Internal Medicine | Admitting: Internal Medicine

## 2024-03-13 ENCOUNTER — Observation Stay (HOSPITAL_COMMUNITY)

## 2024-03-13 DIAGNOSIS — D638 Anemia in other chronic diseases classified elsewhere: Secondary | ICD-10-CM | POA: Diagnosis present

## 2024-03-13 DIAGNOSIS — D5700 Hb-SS disease with crisis, unspecified: Secondary | ICD-10-CM | POA: Diagnosis not present

## 2024-03-13 DIAGNOSIS — Z86718 Personal history of other venous thrombosis and embolism: Secondary | ICD-10-CM

## 2024-03-13 DIAGNOSIS — M25552 Pain in left hip: Secondary | ICD-10-CM | POA: Insufficient documentation

## 2024-03-13 DIAGNOSIS — K5900 Constipation, unspecified: Secondary | ICD-10-CM | POA: Diagnosis present

## 2024-03-13 DIAGNOSIS — G894 Chronic pain syndrome: Secondary | ICD-10-CM | POA: Diagnosis present

## 2024-03-13 DIAGNOSIS — R4689 Other symptoms and signs involving appearance and behavior: Secondary | ICD-10-CM | POA: Diagnosis present

## 2024-03-13 LAB — CBC WITH DIFFERENTIAL/PLATELET
Abs Immature Granulocytes: 0.03 10*3/uL (ref 0.00–0.07)
Basophils Absolute: 0 10*3/uL (ref 0.0–0.1)
Basophils Relative: 0 %
Eosinophils Absolute: 0.2 10*3/uL (ref 0.0–0.5)
Eosinophils Relative: 2 %
HCT: 33.1 % — ABNORMAL LOW (ref 39.0–52.0)
Hemoglobin: 11.7 g/dL — ABNORMAL LOW (ref 13.0–17.0)
Immature Granulocytes: 0 %
Lymphocytes Relative: 29 %
Lymphs Abs: 3.1 10*3/uL (ref 0.7–4.0)
MCH: 30.5 pg (ref 26.0–34.0)
MCHC: 35.3 g/dL (ref 30.0–36.0)
MCV: 86.2 fL (ref 80.0–100.0)
Monocytes Absolute: 1.2 10*3/uL — ABNORMAL HIGH (ref 0.1–1.0)
Monocytes Relative: 11 %
Neutro Abs: 6.3 10*3/uL (ref 1.7–7.7)
Neutrophils Relative %: 58 %
Platelets: 403 10*3/uL — ABNORMAL HIGH (ref 150–400)
RBC: 3.84 MIL/uL — ABNORMAL LOW (ref 4.22–5.81)
RDW: 19.3 % — ABNORMAL HIGH (ref 11.5–15.5)
Smear Review: NORMAL
WBC: 10.9 10*3/uL — ABNORMAL HIGH (ref 4.0–10.5)
nRBC: 0.8 % — ABNORMAL HIGH (ref 0.0–0.2)

## 2024-03-13 LAB — URINALYSIS, ROUTINE W REFLEX MICROSCOPIC
Bilirubin Urine: NEGATIVE
Glucose, UA: NEGATIVE mg/dL
Ketones, ur: NEGATIVE mg/dL
Nitrite: NEGATIVE
Protein, ur: NEGATIVE mg/dL
Specific Gravity, Urine: 1.012 (ref 1.005–1.030)
pH: 5 (ref 5.0–8.0)

## 2024-03-13 LAB — RETICULOCYTES
Immature Retic Fract: 34.3 % — ABNORMAL HIGH (ref 2.3–15.9)
RBC.: 3.83 MIL/uL — ABNORMAL LOW (ref 4.22–5.81)
Retic Count, Absolute: 295.3 10*3/uL — ABNORMAL HIGH (ref 19.0–186.0)
Retic Ct Pct: 7.7 % — ABNORMAL HIGH (ref 0.4–3.1)

## 2024-03-13 LAB — COMPREHENSIVE METABOLIC PANEL WITH GFR
ALT: 11 U/L (ref 0–44)
AST: 34 U/L (ref 15–41)
Albumin: 4.6 g/dL (ref 3.5–5.0)
Alkaline Phosphatase: 98 U/L (ref 38–126)
Anion gap: 11 (ref 5–15)
BUN: <5 mg/dL — ABNORMAL LOW (ref 6–20)
CO2: 26 mmol/L (ref 22–32)
Calcium: 9.2 mg/dL (ref 8.9–10.3)
Chloride: 102 mmol/L (ref 98–111)
Creatinine, Ser: 0.67 mg/dL (ref 0.61–1.24)
GFR, Estimated: >60 mL/min
Glucose, Bld: 87 mg/dL (ref 70–99)
Potassium: 3.9 mmol/L (ref 3.5–5.1)
Sodium: 139 mmol/L (ref 135–145)
Total Bilirubin: 2 mg/dL — ABNORMAL HIGH (ref 0.0–1.2)
Total Protein: 8.2 g/dL — ABNORMAL HIGH (ref 6.5–8.1)

## 2024-03-13 MED ORDER — OXYCODONE-ACETAMINOPHEN 10-325 MG PO TABS: 1.0000 | ORAL_TABLET | ORAL | Status: DC | PRN

## 2024-03-13 MED ORDER — HYDROMORPHONE HCL 1 MG/ML IJ SOLN
2.0000 mg | INTRAMUSCULAR | Status: AC
  Administered 2024-03-13: 2 mg via INTRAVENOUS
  Filled 2024-03-13: qty 2

## 2024-03-13 MED ORDER — APIXABAN 5 MG PO TABS
5.0000 mg | ORAL_TABLET | Freq: Two times a day (BID) | ORAL | Status: DC
  Administered 2024-03-13 – 2024-03-21 (×16): 5 mg via ORAL
  Filled 2024-03-13 (×16): qty 1

## 2024-03-13 MED ORDER — KETOROLAC TROMETHAMINE 15 MG/ML IJ SOLN
15.0000 mg | Freq: Four times a day (QID) | INTRAMUSCULAR | Status: AC
  Administered 2024-03-13 – 2024-03-18 (×20): 15 mg via INTRAVENOUS
  Filled 2024-03-13 (×20): qty 1

## 2024-03-13 MED ORDER — SODIUM CHLORIDE 0.9 % IV SOLN
12.5000 mg | Freq: Once | INTRAVENOUS | Status: AC
  Administered 2024-03-13: 12.5 mg via INTRAVENOUS
  Filled 2024-03-13: qty 12.5

## 2024-03-13 MED ORDER — ONDANSETRON HCL 4 MG PO TABS: 4.0000 mg | ORAL_TABLET | ORAL | Status: DC | PRN

## 2024-03-13 MED ORDER — ONDANSETRON HCL 4 MG/2ML IJ SOLN
4.0000 mg | INTRAMUSCULAR | Status: DC | PRN
  Administered 2024-03-13: 4 mg via INTRAVENOUS
  Filled 2024-03-13: qty 2

## 2024-03-13 MED ORDER — POLYETHYLENE GLYCOL 3350 17 G PO PACK: 17.0000 g | PACK | Freq: Every day | ORAL | Status: DC | PRN

## 2024-03-13 MED ORDER — HYDROMORPHONE HCL 1 MG/ML IJ SOLN
2.0000 mg | INTRAMUSCULAR | Status: DC
  Filled 2024-03-13: qty 2

## 2024-03-13 MED ORDER — HYDROMORPHONE HCL 1 MG/ML IJ SOLN
2.0000 mg | INTRAMUSCULAR | Status: AC | PRN
  Administered 2024-03-13 – 2024-03-14 (×3): 2 mg via INTRAVENOUS
  Filled 2024-03-13 (×3): qty 2

## 2024-03-13 MED ORDER — HYDROMORPHONE HCL 1 MG/ML IJ SOLN
2.0000 mg | INTRAMUSCULAR | Status: AC
  Administered 2024-03-13: 2 mg via INTRAVENOUS

## 2024-03-13 MED ORDER — KETOROLAC TROMETHAMINE 15 MG/ML IJ SOLN
15.0000 mg | INTRAMUSCULAR | Status: AC
  Administered 2024-03-13: 15 mg via INTRAVENOUS
  Filled 2024-03-13: qty 1

## 2024-03-13 MED ORDER — OXYCODONE HCL 5 MG PO TABS
15.0000 mg | ORAL_TABLET | Freq: Once | ORAL | Status: AC
  Administered 2024-03-13: 15 mg via ORAL
  Filled 2024-03-13: qty 3

## 2024-03-13 MED ORDER — OXYCODONE-ACETAMINOPHEN 5-325 MG PO TABS
1.0000 | ORAL_TABLET | ORAL | Status: DC | PRN
  Administered 2024-03-14 (×3): 1 via ORAL
  Filled 2024-03-13 (×3): qty 1

## 2024-03-13 MED ORDER — LACTATED RINGERS IV BOLUS
500.0000 mL | Freq: Once | INTRAVENOUS | Status: AC
  Administered 2024-03-13: 500 mL via INTRAVENOUS

## 2024-03-13 MED ORDER — HYDROMORPHONE HCL 1 MG/ML IJ SOLN
2.0000 mg | Freq: Once | INTRAMUSCULAR | Status: DC
  Filled 2024-03-13: qty 2

## 2024-03-13 MED ORDER — OXYCODONE HCL 5 MG PO TABS
5.0000 mg | ORAL_TABLET | ORAL | Status: DC | PRN
  Administered 2024-03-14 – 2024-03-15 (×5): 5 mg via ORAL
  Filled 2024-03-13 (×5): qty 1

## 2024-03-13 MED ORDER — HYDROMORPHONE HCL 1 MG/ML IJ SOLN
2.0000 mg | Freq: Once | INTRAMUSCULAR | Status: AC
  Administered 2024-03-13: 2 mg via INTRAVENOUS
  Filled 2024-03-13: qty 2

## 2024-03-13 MED ORDER — ONDANSETRON HCL 4 MG/2ML IJ SOLN: 4.0000 mg | INTRAMUSCULAR | Status: DC | PRN

## 2024-03-13 MED ORDER — SENNOSIDES-DOCUSATE SODIUM 8.6-50 MG PO TABS
1.0000 | ORAL_TABLET | Freq: Two times a day (BID) | ORAL | Status: DC
  Administered 2024-03-13 – 2024-03-20 (×9): 1 via ORAL
  Filled 2024-03-13 (×14): qty 1

## 2024-03-13 MED ORDER — HYDROXYUREA 500 MG PO CAPS
500.0000 mg | ORAL_CAPSULE | Freq: Two times a day (BID) | ORAL | Status: DC
  Administered 2024-03-14 – 2024-03-15 (×4): 500 mg via ORAL
  Filled 2024-03-13 (×4): qty 1

## 2024-03-13 MED ORDER — SODIUM CHLORIDE 0.45 % IV SOLN: INTRAVENOUS | Status: AC

## 2024-03-13 NOTE — ED Triage Notes (Signed)
 Patient reports sickle cell pain since last night. Pain all over.Patient is alert and oriented x 4. Airway patent, respirations even and unlabored. Skin normal, warm and dry. Patient has no new complaints at this time. Siderails up x 2. Call light at bedside.

## 2024-03-13 NOTE — Hospital Course (Signed)
 Timothy Marks is a 34 y.o. male with medical history significant for sickle cell disease, Crohn's disease, chronic pain syndrome, history of DVT on Eliquis , depression/anxiety, avascular necrosis of right shoulder who is admitted with sickle cell pain crisis.

## 2024-03-13 NOTE — Discharge Instructions (Signed)
 Thank you for visiting the Emergency Department today. It was a pleasure to be part of your healthcare team.   Your were seen today for sickle cell pain crisis   As discussed, rest, hydrate, resume diet as tolerated, utilize home pain management as needed for symptomatic relief, and follow-up with your pain management/sickle cell clinic for further evaluation and care.  It is important to watch for warning signs such as worsening pain or any new/worsening symptoms. If any of these happen, return to the Emergency Department or call 911.  Thank you for trusting us  with your health.

## 2024-03-13 NOTE — H&P (Signed)
 " History and Physical    [name]:[id number] DOB: 1990-06-09 DOA: 03/13/2024  PCP: Paseda, Folashade R, FNP  Patient coming from: Home  I have personally briefly reviewed patient's old medical records in [facility]  Chief Complaint: Sickle cell pain crisis  HPI: Timothy Marks is a 34 y.o. male with medical history significant for sickle cell disease, Crohn's disease, chronic pain syndrome, history of DVT on Eliquis , depression/anxiety, avascular necrosis of right shoulder who presented to the ED for evaluation of sickle cell pain.  Patient reports symptoms of sickle cell pain crisis beginning yesterday.  He says pain is all over but more pronounced in the right arm, left leg, and lower back.  He takes Percocet 10-325 mg every 4 hours as needed for pain management as an outpatient.  He says he took his last Percocet yesterday and is not due for refill until Friday 1/30.  He was seen at the [facility] and [facility] EDs for the same yesterday.  He denies chest pain, dyspnea, fevers, chills, diaphoresis.  He also is reporting left knee pain which he is concerned feels a little different than his typical sickle cell pain.  No recent injury or swelling to the knee.  ED Course  Labs/Imaging on admission: I have personally reviewed following labs and imaging studies.  Initial vitals showed BP 120/84, pulse 72, RR 16, temp 98.4 F, SpO2 99% on room air.  Labs showed WBC 10.9, hemoglobin 11.7, platelets 403, sodium 139, potassium 3.9, bicarb 26, BUN <5, creatinine 0.67, serum glucose 87, total bilirubin 2.0.  Portable chest x-ray negative for focal consolidation, edema, effusion.  Patient was given 500 mLs LR, IV Dilaudid  2 mg x 3, IV Toradol  15 mg, Oxy IR 15 mg.  The hospitalist service was consulted for admission.  Review of Systems: All systems reviewed and are negative except as documented in history of present illness above.   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 Authorizing Provider  apixaban  (ELIQUIS ) 5 MG TABS tablet Take 1 tablet (5 mg total) by mouth 2 (two) times daily. 08/30/23   Paseda, Folashade R, FNP  cetirizine (ZYRTEC) 10 MG tablet Take 10 mg by mouth daily.    [provider]  diphenhydrAMINE  (BENADRYL ) 25 mg capsule Take 1 capsule (25 mg total) by mouth every 6 (six) hours as needed for itching. 01/17/23   Paseda, Folashade R, FNP  ergocalciferol  (VITAMIN D2) 1.25 MG (50000 UT) capsule Take 1 capsule (50,000 Units total) by mouth once a week. 01/03/24   Paseda, Folashade R, FNP  folic acid  (FOLVITE ) 1 MG tablet Take 1 tablet (1 mg total) by mouth daily. 08/30/23   Paseda, Folashade R, FNP  hydroxyurea  (HYDREA ) 500 MG capsule Take 1 capsule (500 mg total) by mouth 2 (two) times daily. May take with food to minimize  GI side effects. 06/19/23   Paseda, Folashade R, FNP  ibuprofen  (ADVIL ) 800 MG tablet Take 1 tablet (800 mg total) by mouth every 8 (eight) hours as needed. 08/30/23   Paseda, Folashade R, FNP  naloxone  (NARCAN ) nasal spray 4 mg/0.1 mL Place 1 spray into the nose once. 09/04/22   [provider]  oxyCODONE -acetaminophen  (PERCOCET) 10-325 MG tablet Take 1 tablet by mouth every 4 (four) hours as needed for up to 15 days for pain. 03/15/24 03/30/24  Paseda, Folashade R, FNP    Physical Exam: Vitals:   03/13/24 1101 03/13/24 1423  03/13/24 1824 03/13/24 1852  BP: 120/84 111/74 104/83   Pulse: 72 82 77   Resp: 16 16 16    Temp: 98.4 F (36.9 C) 99.2 F (37.3 C)  98.8 F (37.1 C)  TempSrc: Oral Oral    SpO2: 99% 99% 96%    Constitutional: Thin man resting in bed, NAD, calm Eyes: EOMI, lids and conjunctivae normal ENMT: Mucous membranes are moist. Posterior pharynx clear of any exudate or lesions.Normal dentition.  Neck: normal, supple, no masses. Respiratory: clear to auscultation bilaterally, no wheezing, no crackles. Normal respiratory effort. No accessory muscle use.  Cardiovascular: Regular rate and rhythm, no murmurs / rubs / gallops. No extremity edema. 2+ pedal pulses. Abdomen: no tenderness, no masses palpated. Musculoskeletal: no clubbing / cyanosis. No joint deformity upper and lower extremities. Good ROM, no contractures. Normal muscle tone.  Left knee ROM intact, no effusion, erythema, or tenderness to palpation. Skin: no rashes, lesions, ulcers. No induration Neurologic: Sensation intact. Strength 5/5 in all 4.  Psychiatric: Normal judgment and insight. Alert and oriented x 4. Normal mood.   EKG: Personally reviewed. Sinus rhythm, rate 85, no acute ischemic changes.  Similar to previous.  Assessment/Plan Principal Problem:   Sickle cell anemia with crisis (HCC) Active Problems:   History of DVT (deep vein thrombosis)   Timothy Marks is a 34 y.o. male with medical history significant for sickle cell disease, Crohn's disease, chronic pain syndrome, history of DVT on Eliquis , depression/anxiety, avascular necrosis of right shoulder who is admitted with sickle cell pain crisis.  Assessment and Plan: Sickle cell anemia with acute pain crisis: - Start IV Dilaudid  PCA pump - Continue home Percocet 10-3.5 mg every 4 hours as needed - IV Toradol  15 mg every 6 hours - 0.45% NS@ 100 mL/hour overnight  Left knee pain: Suspect related to sickle cell pain crisis.  No joint effusion, erythema, or  diminished range of motion appreciated on exam.  Follow-up left knee x-ray.  History of DVT: Continue Eliquis .   DVT prophylaxis:  apixaban  (ELIQUIS ) tablet 5 mg   Code Status: Full code Family Communication: Discussed with patient, he has discussed with family Disposition Plan: From home, dispo pending clinical progress Consults called: None Severity of Illness: The appropriate patient status for this patient is OBSERVATION. Observation status is judged to be reasonable and necessary in order to provide the required intensity of service to ensure the patient's safety. The patient's presenting symptoms, physical exam findings, and initial radiographic and laboratory data in the context of their medical condition is felt to place them at decreased risk for further clinical deterioration. Furthermore, it is anticipated that the patient will be medically stable for discharge from the hospital within 2 midnights of admission.   Jorie Blanch MD [facility]  If 7PM-7AM, please contact night-coverage [email or web address]  03/13/2024, 8:25 PM      [1]  Social History Tobacco Use  Smoking status: Former    Current packs/day: 2.00    Average packs/day: 2.0 packs/day for 2.0 years (4.0 ttl pk-yrs)    Types: Cigarettes    Passive exposure: Past   Smokeless tobacco: Never   Tobacco comments:    Stress  Vaping Use   Vaping status: Never Used  Substance Use Topics   Alcohol  use: No   Drug use: Yes    Frequency: 7.0 times per week    Types: Marijuana  [2]  Allergies Allergen Reactions   Buprenorphine Hcl Hives   Levaquin  [Levofloxacin ] Itching   Meperidine Rash   Morphine  Hives    Takes MS IR at home   Morphine  And Codeine Hives    Takes MSIR PTA   Tramadol Hives   Vancomycin Itching   Zosyn [Piperacillin Sod-Tazobactam So] Itching, Rash and Other (See Comments)    Has taken rocephin  in the past   "

## 2024-03-14 ENCOUNTER — Encounter (HOSPITAL_COMMUNITY): Payer: Self-pay | Admitting: Internal Medicine

## 2024-03-14 ENCOUNTER — Other Ambulatory Visit: Payer: Self-pay

## 2024-03-14 LAB — COMPREHENSIVE METABOLIC PANEL WITH GFR
ALT: 26 U/L (ref 0–44)
AST: 45 U/L — ABNORMAL HIGH (ref 15–41)
Albumin: 4 g/dL (ref 3.5–5.0)
Alkaline Phosphatase: 93 U/L (ref 38–126)
Anion gap: 7 (ref 5–15)
BUN: 8 mg/dL (ref 6–20)
CO2: 29 mmol/L (ref 22–32)
Calcium: 8.7 mg/dL — ABNORMAL LOW (ref 8.9–10.3)
Chloride: 100 mmol/L (ref 98–111)
Creatinine, Ser: 0.71 mg/dL (ref 0.61–1.24)
GFR, Estimated: >60 mL/min
Glucose, Bld: 94 mg/dL (ref 70–99)
Potassium: 4.1 mmol/L (ref 3.5–5.1)
Sodium: 136 mmol/L (ref 135–145)
Total Bilirubin: 2.8 mg/dL — ABNORMAL HIGH (ref 0.0–1.2)
Total Protein: 6.9 g/dL (ref 6.5–8.1)

## 2024-03-14 LAB — CBC
HCT: 28.8 % — ABNORMAL LOW (ref 39.0–52.0)
Hemoglobin: 9.5 g/dL — ABNORMAL LOW (ref 13.0–17.0)
MCH: 29.1 pg (ref 26.0–34.0)
MCHC: 33 g/dL (ref 30.0–36.0)
MCV: 88.3 fL (ref 80.0–100.0)
Platelets: 347 10*3/uL (ref 150–400)
RBC: 3.26 MIL/uL — ABNORMAL LOW (ref 4.22–5.81)
RDW: 18.8 % — ABNORMAL HIGH (ref 11.5–15.5)
WBC: 9.2 10*3/uL (ref 4.0–10.5)
nRBC: 0.7 % — ABNORMAL HIGH (ref 0.0–0.2)

## 2024-03-14 MED ORDER — NALOXONE HCL 0.4 MG/ML IJ SOLN: 0.4000 mg | INTRAMUSCULAR | Status: DC | PRN

## 2024-03-14 MED ORDER — HYDROMORPHONE 1 MG/ML IV SOLN
INTRAVENOUS | Status: DC
  Administered 2024-03-14: 3 mg via INTRAVENOUS
  Administered 2024-03-14: 6 mg via INTRAVENOUS
  Administered 2024-03-14: 30 mg via INTRAVENOUS
  Administered 2024-03-15: 8 mg via INTRAVENOUS
  Administered 2024-03-15: 3 mg via INTRAVENOUS
  Administered 2024-03-15: 4 mg via INTRAVENOUS
  Administered 2024-03-15: 30 mg via INTRAVENOUS
  Administered 2024-03-16: 6 mg via INTRAVENOUS
  Administered 2024-03-16: 4 mg via INTRAVENOUS
  Administered 2024-03-16: 0.5 mg via INTRAVENOUS
  Administered 2024-03-16: 1.5 mg via INTRAVENOUS
  Administered 2024-03-16: 30 mg via INTRAVENOUS
  Administered 2024-03-16: 0.5 mg via INTRAVENOUS
  Administered 2024-03-16: 4.5 mg via INTRAVENOUS
  Administered 2024-03-17: 7 mg via INTRAVENOUS
  Administered 2024-03-17: 7.5 mg via INTRAVENOUS
  Administered 2024-03-17: 6.5 mg via INTRAVENOUS
  Administered 2024-03-17: 1 mg via INTRAVENOUS
  Administered 2024-03-17: 3 mg via INTRAVENOUS
  Administered 2024-03-17: 30 mg via INTRAVENOUS
  Administered 2024-03-18: 6 mg via INTRAVENOUS
  Administered 2024-03-18: 2.5 mg via INTRAVENOUS
  Administered 2024-03-18: 30 mg via INTRAVENOUS
  Administered 2024-03-19: 3.5 mg via INTRAVENOUS
  Administered 2024-03-19: 30 mg via INTRAVENOUS
  Administered 2024-03-19: 2.5 mg via INTRAVENOUS
  Filled 2024-03-14 (×6): qty 30

## 2024-03-14 MED ORDER — SODIUM CHLORIDE 0.9% FLUSH: 9.0000 mL | INTRAVENOUS | Status: DC | PRN

## 2024-03-14 MED ORDER — HYDROMORPHONE HCL 1 MG/ML IJ SOLN
1.0000 mg | INTRAMUSCULAR | Status: DC | PRN
  Administered 2024-03-14 (×3): 1 mg via INTRAVENOUS
  Filled 2024-03-14 (×3): qty 1

## 2024-03-14 MED ORDER — DIPHENHYDRAMINE HCL 25 MG PO CAPS
25.0000 mg | ORAL_CAPSULE | ORAL | Status: DC | PRN
  Administered 2024-03-14 – 2024-03-19 (×4): 25 mg via ORAL
  Filled 2024-03-14 (×4): qty 1

## 2024-03-14 NOTE — ED Notes (Signed)
 Discussed ongoing pain management for pt hold in the ED with floor coverage Isaiah, NP.   Orders for PCA pump pending completion for when pt goes to floor. NP informed orders are unable to be initiative in ED. Requesting alternative method for pain management. Orders placed by PA.

## 2024-03-14 NOTE — ED Notes (Signed)
 Pt refused hospital gown. Pt refused vs monitoring. Sts he doesn't like them pulling. He refused vs at this time. Sts they just got them. Pt has call bell within reach

## 2024-03-14 NOTE — Progress Notes (Signed)
 Patient ID: Timothy Marks, male   DOB: [birth year], 34 y.o.   MRN: [id number] Subjective: Timothy Marks is a 34 y.o. male with medical history significant for sickle cell disease, Crohn's disease, chronic pain syndrome, history of DVT on Eliquis , depression/anxiety, avascular necrosis of right shoulder who presented to the ED for evaluation of sickle cell pain.   Patient is reporting pain of 8/10 this morning.  He has no new concerns.  Denies nausea, vomiting, diarrhea, chest pain, headache.  Objective:  Vital signs in last 24 hours:  Vitals:   03/14/24 0955 03/14/24 1111 03/14/24 1206 03/14/24 1215  BP: 113/66 (!) 132/55  126/77  Pulse: 80 76  85  Resp:   14 12  Temp:      TempSrc:      SpO2: 99% 99% 96% 97%  Weight:      Height:        Intake/Output from previous day:   Intake/Output Summary (Last 24 hours) at 03/14/2024 1254 Last data filed at 03/14/2024 1105 Gross per 24 hour  Intake 848.65 ml  Output 450 ml  Net 398.65 ml    Physical Exam: General: Alert, awake, oriented x3, in no acute distress.  HEENT: [place]/AT PEERL, EOMI Neck: Trachea midline,  no masses, no thyromegal,y no JVD, no carotid bruit OROPHARYNX:  Moist, No exudate/ erythema/lesions.  Heart: Regular rate and rhythm, without murmurs, rubs, gallops, PMI non-displaced, no heaves or thrills on palpation.  Lungs: Clear to auscultation, no wheezing or rhonchi noted. No increased vocal fremitus resonant to percussion  Abdomen: Soft, nontender, nondistended, positive bowel sounds, no masses no hepatosplenomegaly noted..  Neuro: No focal neurological deficits noted cranial nerves II through XII grossly intact. DTRs 2+ bilaterally upper and lower extremities. Strength 5 out of 5 in bilateral upper and lower extremities. Musculoskeletal: No warm swelling or erythema around joints, no spinal tenderness noted. Psychiatric: Patient alert and oriented x3, good insight and cognition, good recent to remote recall. Lymph  node survey: No cervical axillary or inguinal lymphadenopathy noted.  Lab Results:  Basic Metabolic Panel:    Component Value Date/Time   NA 136 03/14/2024 0540   NA 139 01/03/2024 0950   K 4.1 03/14/2024 0540   CL 100 03/14/2024 0540   CO2 29 03/14/2024 0540   BUN 8 03/14/2024 0540   BUN 7 01/03/2024 0950   CREATININE 0.71 03/14/2024 0540   GLUCOSE 94 03/14/2024 0540   CALCIUM 8.7 (L) 03/14/2024 0540   CBC:    Component Value Date/Time   WBC 9.2 03/14/2024 0540   HGB 9.5 (L) 03/14/2024 0540   HGB 7.5 (L) 01/03/2024 0950   HCT 28.8 (L) 03/14/2024 0540   HCT 23.5 (L) 01/03/2024 0950   PLT 347 03/14/2024 0540   PLT 497 (H) 01/03/2024 0950   MCV 88.3 03/14/2024 0540   MCV 95 01/03/2024 0950   NEUTROABS 6.3 03/13/2024 1317   NEUTROABS 5.1 12/04/2023 0918   LYMPHSABS 3.1 03/13/2024 1317   LYMPHSABS 1.7 12/04/2023 0918   MONOABS 1.2 (H) 03/13/2024 1317   EOSABS 0.2 03/13/2024 1317   EOSABS 0.3 12/04/2023 0918   BASOSABS 0.0 03/13/2024 1317   BASOSABS 0.0 12/04/2023 0918    No results found for this or any previous visit (from the past 240 hours).  Studies/Results: DG Knee 1-2 Views Left Result Date: 03/13/2024 EXAM: 1 or 2 VIEW(S) XRAY OF THE LEFT KNEE 03/13/2024 08:38:00 PM COMPARISON: Comparison with 05/07/2014. CLINICAL HISTORY: Left knee pain. FINDINGS: BONES AND JOINTS: No  acute fracture. No malalignment. No focal bone destruction. Bone cortex appears intact. Joint spaces are normal. No significant joint effusion. Intramedullary calcifications demonstrated in the distal femur and proximal tibial shaft. 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 SOFT TISSUES: Unremarkable. IMPRESSION: 1. No acute fracture or dislocation. 2. 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 Electronically signed by: Elsie Gravely MD 03/13/2024 08:47 PM EST RP  Workstation: [id number]   DG Chest Port 1 View Result Date: 03/13/2024 CLINICAL DATA:  Chest pain.  Sickle cell. EXAM: PORTABLE CHEST 1 VIEW COMPARISON:  Chest radiograph dated 12/30/2023. FINDINGS: No focal consolidation, pleural effusion, or pneumothorax. The cardiac silhouette is within normal limits. No acute osseous pathology. Osseous stigmata of sickle cell. IMPRESSION: No active disease. Electronically Signed   By: Vanetta Chou M.D.   On: 03/13/2024 13:39    Medications: Scheduled Meds:  apixaban   5 mg Oral BID   HYDROmorphone    Intravenous Q4H   hydroxyurea   500 mg Oral BID   ketorolac   15 mg Intravenous Q6H   senna-docusate  1 tablet Oral BID   Continuous Infusions: PRN Meds:.diphenhydrAMINE , naloxone  **AND** sodium chloride  flush, ondansetron  **OR** ondansetron  (ZOFRAN ) IV, oxyCODONE -acetaminophen  **AND** oxyCODONE , polyethylene glycol  Consultants: None  Procedures: None  Antibiotics: None  Assessment/Plan: Principal Problem:   Sickle cell anemia with crisis (HCC) Active Problems:   Aggressive behavior   Chronic pain syndrome   Anemia of chronic disease   History of DVT (deep vein thrombosis)   Hb Sickle Cell Disease with Pain crisis: Reduce IVF 0.45% Saline KVO, continue weight based Dilaudid  PCA, IV Toradol  15 mg Q 6 H for a total of 5 days, continue oral home pain medications as ordered. Monitor vitals very closely, Re-evaluate pain scale regularly, 2 L of Oxygen  by [place]. Patient encouraged to ambulate on the hallway today.    Anemia of Chronic Disease: Hemoglobin within patient's baseline, no clinical indication for transfusion.  Will continue to monitor daily CBC. Chronic pain Syndrome: Continue oral home pain medication. History of DVT: Continue Eliquis  as prescribed. Aggressive behavior: Patient laying in bed cursing and irritated.  Patient is upset that he has to wear a nasal cannula and demands why he is unable to get pain medicine without nasal cannula.   Education provided to patient on the importance of wearing nasal cannula while on PCA.  Patient cursed out and replied you all are aggravating me.  Code Status: Full Code Family Communication: N/A Disposition Plan: Not yet ready for discharge  Homer CHRISTELLA Cover NP  If 7PM-7AM, please contact night-coverage.  03/14/2024, 12:54 PM  LOS: 0 days

## 2024-03-14 NOTE — ED Notes (Signed)
 Pt continues to refuse vs / cardiac monitor

## 2024-03-15 ENCOUNTER — Other Ambulatory Visit (HOSPITAL_COMMUNITY): Payer: Self-pay

## 2024-03-15 LAB — COMPREHENSIVE METABOLIC PANEL WITH GFR
ALT: 26 U/L (ref 0–44)
AST: 39 U/L (ref 15–41)
Albumin: 3.9 g/dL (ref 3.5–5.0)
Alkaline Phosphatase: 95 U/L (ref 38–126)
Anion gap: 9 (ref 5–15)
BUN: 9 mg/dL (ref 6–20)
CO2: 26 mmol/L (ref 22–32)
Calcium: 8.9 mg/dL (ref 8.9–10.3)
Chloride: 101 mmol/L (ref 98–111)
Creatinine, Ser: 0.65 mg/dL (ref 0.61–1.24)
GFR, Estimated: >60 mL/min
Glucose, Bld: 92 mg/dL (ref 70–99)
Potassium: 4.3 mmol/L (ref 3.5–5.1)
Sodium: 136 mmol/L (ref 135–145)
Total Bilirubin: 2.3 mg/dL — ABNORMAL HIGH (ref 0.0–1.2)
Total Protein: 7.1 g/dL (ref 6.5–8.1)

## 2024-03-15 MED ORDER — OXYCODONE HCL 5 MG PO TABS
5.0000 mg | ORAL_TABLET | ORAL | Status: DC | PRN
  Administered 2024-03-15: 5 mg via ORAL
  Filled 2024-03-15: qty 1

## 2024-03-15 MED ORDER — OXYCODONE-ACETAMINOPHEN 10-325 MG PO TABS: 1.0000 | ORAL_TABLET | ORAL | Status: DC | PRN

## 2024-03-15 MED ORDER — HYDROMORPHONE HCL 1 MG/ML IJ SOLN
2.0000 mg | Freq: Once | INTRAMUSCULAR | Status: AC
  Administered 2024-03-15: 2 mg via INTRAVENOUS
  Filled 2024-03-15: qty 2

## 2024-03-15 MED ORDER — HYDROXYUREA 500 MG PO CAPS
500.0000 mg | ORAL_CAPSULE | Freq: Two times a day (BID) | ORAL | Status: DC
  Administered 2024-03-15 – 2024-03-21 (×12): 500 mg via ORAL
  Filled 2024-03-15 (×12): qty 1

## 2024-03-15 MED ORDER — FOLIC ACID 1 MG PO TABS
1.0000 mg | ORAL_TABLET | Freq: Every day | ORAL | Status: DC
  Administered 2024-03-15 – 2024-03-21 (×7): 1 mg via ORAL
  Filled 2024-03-15 (×7): qty 1

## 2024-03-15 MED ORDER — OXYCODONE-ACETAMINOPHEN 5-325 MG PO TABS
1.0000 | ORAL_TABLET | ORAL | Status: DC | PRN
  Administered 2024-03-15: 1 via ORAL
  Filled 2024-03-15: qty 1

## 2024-03-15 MED ORDER — OXYCODONE HCL 5 MG PO TABS
10.0000 mg | ORAL_TABLET | ORAL | Status: DC | PRN
  Administered 2024-03-15 – 2024-03-21 (×7): 10 mg via ORAL
  Filled 2024-03-15 (×7): qty 2

## 2024-03-15 NOTE — Plan of Care (Signed)

## 2024-03-15 NOTE — Progress Notes (Signed)
 Patient ID: Timothy Marks, male   DOB: 12-20-1990, 34 y.o.   MRN: [id number] Subjective: Timothy Marks is a 34 y.o. male with medical history significant for sickle cell disease, Crohn's disease, chronic pain syndrome, history of DVT on Eliquis , depression/anxiety, avascular necrosis of right shoulder who presented to the ED for evaluation of sickle cell pain.   Patient is reporting pain of 9/10 this morning with no improvement from yesterday.  He has no new concerns.  Denies nausea, vomiting, diarrhea, chest pain, headache.  Objective:  Vital signs in last 24 hours:  Vitals:   03/15/24 0417 03/15/24 0521 03/15/24 0800 03/15/24 0851  BP: 103/61   124/69  Pulse: 71   75  Resp: 13 10 16 15   Temp: 98.1 F (36.7 C)   98.4 F (36.9 C)  TempSrc: Oral   Oral  SpO2: 99%   99%  Weight:      Height:        Intake/Output from previous day:   Intake/Output Summary (Last 24 hours) at 03/15/2024 1029 Last data filed at 03/14/2024 2330 Gross per 24 hour  Intake 9 ml  Output 850 ml  Net -841 ml    Physical Exam: General: Alert, awake, oriented x3, in no acute distress.  HEENT: [place]/AT PEERL, EOMI Neck: Trachea midline,  no masses, no thyromegal,y no JVD, no carotid bruit OROPHARYNX:  Moist, No exudate/ erythema/lesions.  Heart: Regular rate and rhythm, without murmurs, rubs, gallops, PMI non-displaced, no heaves or thrills on palpation.  Lungs: Clear to auscultation, no wheezing or rhonchi noted. No increased vocal fremitus resonant to percussion  Abdomen: Soft, nontender, nondistended, positive bowel sounds, no masses no hepatosplenomegaly noted..  Neuro: No focal neurological deficits noted cranial nerves II through XII grossly intact. DTRs 2+ bilaterally upper and lower extremities. Strength 5 out of 5 in bilateral upper and lower extremities. Musculoskeletal: No warm swelling or erythema around joints, no spinal tenderness noted. Psychiatric: Patient alert and oriented x3, good  insight and cognition, good recent to remote recall. Lymph node survey: No cervical axillary or inguinal lymphadenopathy noted.  Lab Results:  Basic Metabolic Panel:    Component Value Date/Time   NA 136 03/14/2024 0540   NA 139 01/03/2024 0950   K 4.1 03/14/2024 0540   CL 100 03/14/2024 0540   CO2 29 03/14/2024 0540   BUN 8 03/14/2024 0540   BUN 7 01/03/2024 0950   CREATININE 0.71 03/14/2024 0540   GLUCOSE 94 03/14/2024 0540   CALCIUM 8.7 (L) 03/14/2024 0540   CBC:    Component Value Date/Time   WBC 9.2 03/14/2024 0540   HGB 9.5 (L) 03/14/2024 0540   HGB 7.5 (L) 01/03/2024 0950   HCT 28.8 (L) 03/14/2024 0540   HCT 23.5 (L) 01/03/2024 0950   PLT 347 03/14/2024 0540   PLT 497 (H) 01/03/2024 0950   MCV 88.3 03/14/2024 0540   MCV 95 01/03/2024 0950   NEUTROABS 6.3 03/13/2024 1317   NEUTROABS 5.1 12/04/2023 0918   LYMPHSABS 3.1 03/13/2024 1317   LYMPHSABS 1.7 12/04/2023 0918   MONOABS 1.2 (H) 03/13/2024 1317   EOSABS 0.2 03/13/2024 1317   EOSABS 0.3 12/04/2023 0918   BASOSABS 0.0 03/13/2024 1317   BASOSABS 0.0 12/04/2023 0918    No results found for this or any previous visit (from the past 240 hours).  Studies/Results: DG Knee 1-2 Views Left Result Date: 03/13/2024 EXAM: 1 or 2 VIEW(S) XRAY OF THE LEFT KNEE 03/13/2024 08:38:00 PM COMPARISON: Comparison with 05/07/2014.  CLINICAL HISTORY: Left knee pain. FINDINGS: BONES AND JOINTS: No acute fracture. No malalignment. No focal bone destruction. Bone cortex appears intact. Joint spaces are normal. No significant joint effusion. Intramedullary calcifications demonstrated in the distal femur and proximal tibial shaft. 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 SOFT TISSUES: Unremarkable. IMPRESSION: 1. No acute fracture or dislocation. 2. 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 Electronically signed  by: Elsie Gravely MD 03/13/2024 08:47 PM EST RP Workstation: [id number]   DG Chest Port 1 View Result Date: 03/13/2024 CLINICAL DATA:  Chest pain.  Sickle cell. EXAM: PORTABLE CHEST 1 VIEW COMPARISON:  Chest radiograph dated 12/30/2023. FINDINGS: No focal consolidation, pleural effusion, or pneumothorax. The cardiac silhouette is within normal limits. No acute osseous pathology. Osseous stigmata of sickle cell. IMPRESSION: No active disease. Electronically Signed   By: Vanetta Chou M.D.   On: 03/13/2024 13:39    Medications: Scheduled Meds:  apixaban   5 mg Oral BID   HYDROmorphone    Intravenous Q4H   hydroxyurea   500 mg Oral BID   ketorolac   15 mg Intravenous Q6H   senna-docusate  1 tablet Oral BID   Continuous Infusions: PRN Meds:.diphenhydrAMINE , naloxone  **AND** sodium chloride  flush, ondansetron  **OR** ondansetron  (ZOFRAN ) IV, oxyCODONE -acetaminophen  **AND** oxyCODONE , polyethylene glycol  Consultants: None  Procedures: None  Antibiotics: None  Assessment/Plan: Principal Problem:   Sickle cell anemia with crisis (HCC) Active Problems:   Aggressive behavior   Chronic pain syndrome   Anemia of chronic disease   History of DVT (deep vein thrombosis)   Hb Sickle Cell Disease with Pain crisis: Reduce IVF 0.45% Saline KVO, continue weight based Dilaudid  PCA, IV Toradol  15 mg Q 6 H for a total of 5 days, continue oral home pain medications as ordered. Monitor vitals very closely, Re-evaluate pain scale regularly, 2 L of Oxygen  by [place]. Patient encouraged to ambulate on the hallway today.    Anemia of Chronic Disease: Hemoglobin within patient's baseline, no clinical indication for transfusion.  Will continue to monitor daily CBC.  Chronic pain Syndrome: Continue oral home pain medication.  History of DVT: Continue Eliquis  as prescribed.  Aggressive behavior: Patient is calm today.  Code Status: Full Code Family Communication: N/A Disposition Plan: Not yet ready for  discharge  Homer CHRISTELLA Cover NP  If 7PM-7AM, please contact night-coverage.  03/15/2024, 10:29 AM  LOS: 1 day

## 2024-03-16 DIAGNOSIS — D5700 Hb-SS disease with crisis, unspecified: Secondary | ICD-10-CM | POA: Diagnosis not present

## 2024-03-16 LAB — CBC
HCT: 26.7 % — ABNORMAL LOW (ref 39.0–52.0)
Hemoglobin: 9.3 g/dL — ABNORMAL LOW (ref 13.0–17.0)
MCH: 29.5 pg (ref 26.0–34.0)
MCHC: 34.8 g/dL (ref 30.0–36.0)
MCV: 84.8 fL (ref 80.0–100.0)
Platelets: 338 10*3/uL (ref 150–400)
RBC: 3.15 MIL/uL — ABNORMAL LOW (ref 4.22–5.81)
RDW: 18.6 % — ABNORMAL HIGH (ref 11.5–15.5)
WBC: 8.2 10*3/uL (ref 4.0–10.5)
nRBC: 0.6 % — ABNORMAL HIGH (ref 0.0–0.2)

## 2024-03-16 MED ORDER — HALOPERIDOL LACTATE 5 MG/ML IJ SOLN
2.0000 mg | Freq: Once | INTRAMUSCULAR | Status: AC
  Administered 2024-03-16: 2 mg via INTRAVENOUS
  Filled 2024-03-16: qty 1

## 2024-03-16 NOTE — Plan of Care (Signed)

## 2024-03-16 NOTE — Progress Notes (Signed)
 Patient ID: Timothy Marks, male   DOB: December 12, 1990, 34 y.o.   MRN: [id number] Subjective: Timothy Marks is a 34 y.o. male with medical history significant for sickle cell disease, Crohn's disease, chronic pain syndrome, history of DVT on Eliquis , depression/anxiety, avascular necrosis of right shoulder who presented to the ED for evaluation of sickle cell pain.   Patient claims he is feeling slightly better today, his pain is now at 8/10, mostly in his upper arm, lower back and lower extremities, characterized as throbbing and achy.  He has no new complaint today.  He is ambulating in the room.  Appetite is improving.  He denies any fever, cough, chest pain, shortness of breath, nausea, vomiting or diarrhea.  No urinary symptoms.  Objective:  Vital signs in last 24 hours:  Vitals:   03/16/24 0010 03/16/24 0416 03/16/24 0841 03/16/24 1204  BP:      Pulse:      Resp: 14 12 12 11   Temp:      TempSrc:      SpO2: 98% 95% 100% 100%  Weight:      Height:        Intake/Output from previous day:   Intake/Output Summary (Last 24 hours) at 03/16/2024 1320 Last data filed at 03/15/2024 2033 Gross per 24 hour  Intake --  Output 1050 ml  Net -1050 ml    Physical Exam: General: Alert, awake, oriented x3, in no acute distress.  Thin built HEENT: [place]/AT PEERL, EOMI Neck: Trachea midline,  no masses, no thyromegal,y no JVD, no carotid bruit OROPHARYNX:  Moist, No exudate/ erythema/lesions.  Heart: Regular rate and rhythm, without murmurs, rubs, gallops, PMI non-displaced, no heaves or thrills on palpation.  Lungs: Clear to auscultation, no wheezing or rhonchi noted. No increased vocal fremitus resonant to percussion  Abdomen: Soft, nontender, nondistended, positive bowel sounds, no masses no hepatosplenomegaly noted..  Neuro: No focal neurological deficits noted cranial nerves II through XII grossly intact. DTRs 2+ bilaterally upper and lower extremities. Strength 5 out of 5 in bilateral upper  and lower extremities. Musculoskeletal: No warm swelling or erythema around joints, no spinal tenderness noted. Psychiatric: Patient alert and oriented x3, good insight and cognition, good recent to remote recall. Lymph node survey: No cervical axillary or inguinal lymphadenopathy noted.  Lab Results:  Basic Metabolic Panel:    Component Value Date/Time   NA 136 03/15/2024 1306   NA 139 01/03/2024 0950   K 4.3 03/15/2024 1306   CL 101 03/15/2024 1306   CO2 26 03/15/2024 1306   BUN 9 03/15/2024 1306   BUN 7 01/03/2024 0950   CREATININE 0.65 03/15/2024 1306   GLUCOSE 92 03/15/2024 1306   CALCIUM 8.9 03/15/2024 1306   CBC:    Component Value Date/Time   WBC 8.2 03/16/2024 0523   HGB 9.3 (L) 03/16/2024 0523   HGB 7.5 (L) 01/03/2024 0950   HCT 26.7 (L) 03/16/2024 0523   HCT 23.5 (L) 01/03/2024 0950   PLT 338 03/16/2024 0523   PLT 497 (H) 01/03/2024 0950   MCV 84.8 03/16/2024 0523   MCV 95 01/03/2024 0950   NEUTROABS 6.3 03/13/2024 1317   NEUTROABS 5.1 12/04/2023 0918   LYMPHSABS 3.1 03/13/2024 1317   LYMPHSABS 1.7 12/04/2023 0918   MONOABS 1.2 (H) 03/13/2024 1317   EOSABS 0.2 03/13/2024 1317   EOSABS 0.3 12/04/2023 0918   BASOSABS 0.0 03/13/2024 1317   BASOSABS 0.0 12/04/2023 0918    No results found for this or any previous  visit (from the past 240 hours).  Studies/Results: No results found.  Medications: Scheduled Meds:  apixaban   5 mg Oral BID   folic acid   1 mg Oral Daily   HYDROmorphone    Intravenous Q4H   hydroxyurea   500 mg Oral BID   ketorolac   15 mg Intravenous Q6H   senna-docusate  1 tablet Oral BID   Continuous Infusions: PRN Meds:.diphenhydrAMINE , naloxone  **AND** sodium chloride  flush, ondansetron  **OR** ondansetron  (ZOFRAN ) IV, oxyCODONE , polyethylene glycol  Consultants: None  Procedures: None  Antibiotics: None  Assessment/Plan: Principal Problem:   Sickle cell anemia with crisis (HCC) Active Problems:   Aggressive behavior    Chronic pain syndrome   Anemia of chronic disease   History of DVT (deep vein thrombosis)   Hb Sickle Cell Disease with Pain crisis: Continue IVF at KVO.  Continue weight based Dilaudid  PCA at current dose setting, continue IV Toradol  15 mg Q 6 H for a total of 5 days, continue oral home pain medications as ordered. Monitor vitals very closely, Re-evaluate pain scale regularly, 2 L of Oxygen  by [place]. Patient encouraged to ambulate on the hallway today.  Anemia of Chronic Disease: Hemoglobin continues to be stable at baseline.  There is no clinical indication for blood transfusion at this time.  Will monitor very closely and transfuse as appropriate.  Repeat labs in a.m. Chronic pain Syndrome: Continue oral home pain medications as ordered. History of DVT: Continue Eliquis .  Code Status: Full Code Family Communication: N/A Disposition Plan: Not yet ready for discharge  Benno Brensinger  If 7PM-7AM, please contact night-coverage.  03/16/2024, 1:20 PM  LOS: 2 days
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
## 2024-03-17 DIAGNOSIS — D5700 Hb-SS disease with crisis, unspecified: Secondary | ICD-10-CM | POA: Diagnosis not present

## 2024-03-17 MED ORDER — METHOCARBAMOL 1000 MG/10ML IJ SOLN
500.0000 mg | Freq: Four times a day (QID) | INTRAMUSCULAR | Status: DC | PRN
  Administered 2024-03-17 (×3): 500 mg via INTRAVENOUS
  Filled 2024-03-17 (×3): qty 10

## 2024-03-17 NOTE — Progress Notes (Signed)
 Patient ID: Timothy Marks, male   DOB: 09/16/1990, 34 y.o.   MRN: [id number] Subjective: Timothy Marks is a 34 y.o. male with medical history significant for sickle cell disease, Crohn's disease, chronic pain syndrome, history of DVT on Eliquis , depression/anxiety, avascular necrosis of right shoulder who presented to the ED for evaluation of sickle cell pain.   Patient continues to feel better but still in significant pain especially in his left knee joint.  He said he is unable to bend his left knee joint because of pain, he thinks it may be swollen but no redness.  No history of fall or injury.  He has no fever.  He denies any cough, chest pain, shortness of breath, nausea, vomiting or diarrhea.  No urinary symptoms.  Objective:  Vital signs in last 24 hours:  Vitals:   03/17/24 0429 03/17/24 0430 03/17/24 0815 03/17/24 0843  BP: 121/82  112/68   Pulse: 77  70   Resp: 10 10 17 13   Temp: 98.1 F (36.7 C)  98.4 F (36.9 C)   TempSrc: Oral  Oral   SpO2: 100% 100% 100%   Weight:      Height:        Intake/Output from previous day:   Intake/Output Summary (Last 24 hours) at 03/17/2024 1151 Last data filed at 03/17/2024 0843 Gross per 24 hour  Intake 480 ml  Output 1925 ml  Net -1445 ml    Physical Exam: General: Alert, awake, oriented x3, in no acute distress.  Thin built HEENT: [place]/AT [place]ERL, EOMI Neck: Trachea midline,  no masses, no thyromegal,y no JVD, no carotid bruit OROPHARYNX:  Moist, No exudate/ erythema/lesions.  Heart: Regular rate and rhythm, without murmurs, rubs, gallops, PMI non-displaced, no heaves or thrills on palpation.  Lungs: Clear to auscultation, no wheezing or rhonchi noted. No increased vocal fremitus resonant to percussion  Abdomen: Soft, nontender, nondistended, positive bowel sounds, no masses no hepatosplenomegaly noted..  Neuro: No focal neurological deficits noted cranial nerves II through XII grossly intact. DTRs 2+ bilaterally upper and lower  extremities. Strength 5 out of 5 in bilateral upper and lower extremities. Musculoskeletal: Restricted range of motion of right knee joint due to pain.  Tenderness on the right knee joint posteriorly.  No warm swelling or erythema around joints, no spinal tenderness noted. Psychiatric: Patient alert and oriented x3, good insight and cognition, good recent to remote recall. Lymph node survey: No cervical axillary or inguinal lymphadenopathy noted.  Lab Results:  Basic Metabolic Panel:    Component Value Date/Time   NA 136 03/15/2024 1306   NA 139 01/03/2024 0950   K 4.3 03/15/2024 1306   CL 101 03/15/2024 1306   CO2 26 03/15/2024 1306   BUN 9 03/15/2024 1306   BUN 7 01/03/2024 0950   CREATININE 0.65 03/15/2024 1306   GLUCOSE 92 03/15/2024 1306   CALCIUM 8.9 03/15/2024 1306   CBC:    Component Value Date/Time   WBC 8.2 03/16/2024 0523   HGB 9.3 (L) 03/16/2024 0523   HGB 7.5 (L) 01/03/2024 0950   HCT 26.7 (L) 03/16/2024 0523   HCT 23.5 (L) 01/03/2024 0950   PLT 338 03/16/2024 0523   PLT 497 (H) 01/03/2024 0950   MCV 84.8 03/16/2024 0523   MCV 95 01/03/2024 0950   NEUTROABS 6.3 03/13/2024 1317   NEUTROABS 5.1 12/04/2023 0918   LYMPHSABS 3.1 03/13/2024 1317   LYMPHSABS 1.7 12/04/2023 0918   MONOABS 1.2 (H) 03/13/2024 1317   EOSABS 0.2  03/13/2024 1317   EOSABS 0.3 12/04/2023 0918   BASOSABS 0.0 03/13/2024 1317   BASOSABS 0.0 12/04/2023 0918    No results found for this or any previous visit (from the past 240 hours).  Studies/Results: No results found.  Medications: Scheduled Meds:  apixaban   5 mg Oral BID   folic acid   1 mg Oral Daily   HYDROmorphone    Intravenous Q4H   hydroxyurea   500 mg Oral BID   ketorolac   15 mg Intravenous Q6H   senna-docusate  1 tablet Oral BID   Continuous Infusions: PRN Meds:.diphenhydrAMINE , methocarbamol  (ROBAXIN ) injection, naloxone  **AND** sodium chloride  flush, ondansetron  **OR** ondansetron  (ZOFRAN ) IV, oxyCODONE , polyethylene  glycol  Consultants: None  Procedures: None  Antibiotics: None  Assessment/Plan: Principal Problem:   Sickle cell anemia with crisis (HCC) Active Problems:   Aggressive behavior   Chronic pain syndrome   Anemia of chronic disease   History of DVT (deep vein thrombosis)  Hb Sickle Cell Disease with Pain crisis: Continue IVF at KVO.  Continue weight based Dilaudid  PCA at current dose setting, continue IV Toradol  15 mg Q 6 H for a total of 5 days, add a muscle relaxant.  Continue oral home pain medications as ordered. Monitor vitals very closely, Re-evaluate pain scale regularly, 2 L of Oxygen  by [place]. Patient encouraged to ambulate on the hallway today.  Anemia of Chronic Disease: Hemoglobin continues to be stable at baseline.  There is no clinical indication for blood transfusion at this time.  Will monitor very closely and transfuse as appropriate.  Repeat labs in a.m. Chronic pain Syndrome: Continue oral home pain medications as ordered. History of DVT: Continue Eliquis .  Code Status: Full Code Family Communication: N/A Disposition Plan: Not yet ready for discharge  Timothy Marks  If 7PM-7AM, please contact night-coverage.  03/17/2024, 11:51 AM  LOS: 3 days

## 2024-03-17 NOTE — Plan of Care (Signed)
" °  Problem: Respiratory: Goal: Pulmonary complications will be avoided or minimized Outcome: Progressing   Problem: Sensory: Goal: Pain level will decrease with appropriate interventions Outcome: Progressing   Problem: Clinical Measurements: Goal: Ability to maintain clinical measurements within normal limits will improve Outcome: Progressing Goal: Respiratory complications will improve Outcome: Progressing Goal: Cardiovascular complication will be avoided Outcome: Progressing   Problem: Pain Managment: Goal: General experience of comfort will improve and/or be controlled Outcome: Progressing   "

## 2024-03-18 ENCOUNTER — Inpatient Hospital Stay (HOSPITAL_COMMUNITY)

## 2024-03-18 DIAGNOSIS — M79662 Pain in left lower leg: Secondary | ICD-10-CM

## 2024-03-18 DIAGNOSIS — M25552 Pain in left hip: Secondary | ICD-10-CM | POA: Insufficient documentation

## 2024-03-18 LAB — COMPREHENSIVE METABOLIC PANEL WITH GFR
ALT: 19 U/L (ref 0–44)
AST: 43 U/L — ABNORMAL HIGH (ref 15–41)
Albumin: 4 g/dL (ref 3.5–5.0)
Alkaline Phosphatase: 86 U/L (ref 38–126)
Anion gap: 7 (ref 5–15)
BUN: 12 mg/dL (ref 6–20)
CO2: 28 mmol/L (ref 22–32)
Calcium: 9.1 mg/dL (ref 8.9–10.3)
Chloride: 101 mmol/L (ref 98–111)
Creatinine, Ser: 0.67 mg/dL (ref 0.61–1.24)
GFR, Estimated: >60 mL/min
Glucose, Bld: 111 mg/dL — ABNORMAL HIGH (ref 70–99)
Potassium: 4.5 mmol/L (ref 3.5–5.1)
Sodium: 136 mmol/L (ref 135–145)
Total Bilirubin: 1.8 mg/dL — ABNORMAL HIGH (ref 0.0–1.2)
Total Protein: 7.2 g/dL (ref 6.5–8.1)

## 2024-03-18 LAB — CBC
HCT: 23.7 % — ABNORMAL LOW (ref 39.0–52.0)
Hemoglobin: 8.1 g/dL — ABNORMAL LOW (ref 13.0–17.0)
MCH: 28.9 pg (ref 26.0–34.0)
MCHC: 34.2 g/dL (ref 30.0–36.0)
MCV: 84.6 fL (ref 80.0–100.0)
Platelets: 366 10*3/uL (ref 150–400)
RBC: 2.8 MIL/uL — ABNORMAL LOW (ref 4.22–5.81)
RDW: 19 % — ABNORMAL HIGH (ref 11.5–15.5)
WBC: 8.4 10*3/uL (ref 4.0–10.5)
nRBC: 0 % (ref 0.0–0.2)

## 2024-03-18 NOTE — Plan of Care (Signed)
" °  Problem: Self-Care: Goal: Ability to incorporate actions that prevent/reduce pain crisis will improve Outcome: Progressing   Problem: Bowel/Gastric: Goal: Gut motility will be maintained Outcome: Progressing   Problem: Clinical Measurements: Goal: Ability to maintain clinical measurements within normal limits will improve Outcome: Progressing Goal: Diagnostic test results will improve Outcome: Progressing Goal: Respiratory complications will improve Outcome: Progressing Goal: Cardiovascular complication will be avoided Outcome: Progressing   Problem: Coping: Goal: Level of anxiety will decrease Outcome: Progressing   Problem: Pain Managment: Goal: General experience of comfort will improve and/or be controlled Outcome: Progressing   Problem: Safety: Goal: Ability to remain free from injury will improve Outcome: Progressing   "

## 2024-03-19 MED ORDER — HYDROMORPHONE 1 MG/ML IV SOLN
INTRAVENOUS | Status: DC
  Administered 2024-03-19: 2.7 mg via INTRAVENOUS
  Administered 2024-03-20: 3 mg via INTRAVENOUS
  Administered 2024-03-20: 4.2 mg via INTRAVENOUS

## 2024-03-19 NOTE — Plan of Care (Signed)
" °  Problem: Education: Goal: Knowledge of vaso-occlusive preventative measures will improve Outcome: Progressing Goal: Awareness of infection prevention will improve Outcome: Progressing Goal: Awareness of signs and symptoms of anemia will improve Outcome: Progressing Goal: Long-term complications will improve Outcome: Progressing   Problem: Self-Care: Goal: Ability to incorporate actions that prevent/reduce pain crisis will improve Outcome: Progressing   Problem: Bowel/Gastric: Goal: Gut motility will be maintained Outcome: Progressing   Problem: Tissue Perfusion: Goal: Complications related to inadequate tissue perfusion will be avoided or minimized Outcome: Progressing   Problem: Respiratory: Goal: Pulmonary complications will be avoided or minimized Outcome: Progressing Goal: Acute Chest Syndrome will be identified early to prevent complications Outcome: Progressing   Problem: Fluid Volume: Goal: Ability to maintain a balanced intake and output will improve Outcome: Progressing   Problem: Sensory: Goal: Pain level will decrease with appropriate interventions Outcome: Progressing   Problem: Health Behavior: Goal: Postive changes in compliance with treatment and prescription regimens will improve Outcome: Progressing   Problem: Education: Goal: Knowledge of General Education information will improve Description: Including pain rating scale, medication(s)/side effects and non-pharmacologic comfort measures Outcome: Progressing   Problem: Health Behavior/Discharge Planning: Goal: Ability to manage health-related needs will improve Outcome: Progressing   Problem: Clinical Measurements: Goal: Ability to maintain clinical measurements within normal limits will improve Outcome: Progressing Goal: Will remain free from infection Outcome: Progressing Goal: Diagnostic test results will improve Outcome: Progressing Goal: Respiratory complications will improve Outcome:  Progressing Goal: Cardiovascular complication will be avoided Outcome: Progressing   Problem: Activity: Goal: Risk for activity intolerance will decrease Outcome: Progressing   Problem: Coping: Goal: Level of anxiety will decrease Outcome: Progressing   Problem: Elimination: Goal: Will not experience complications related to bowel motility Outcome: Progressing Goal: Will not experience complications related to urinary retention Outcome: Progressing   Problem: Pain Managment: Goal: General experience of comfort will improve and/or be controlled Outcome: Progressing   Problem: Safety: Goal: Ability to remain free from injury will improve Outcome: Progressing   Problem: Skin Integrity: Goal: Risk for impaired skin integrity will decrease Outcome: Progressing   "

## 2024-03-19 NOTE — Plan of Care (Signed)

## 2024-03-20 LAB — CBC
HCT: 21.9 % — ABNORMAL LOW (ref 39.0–52.0)
Hemoglobin: 7.6 g/dL — ABNORMAL LOW (ref 13.0–17.0)
MCH: 28.7 pg (ref 26.0–34.0)
MCHC: 34.7 g/dL (ref 30.0–36.0)
MCV: 82.6 fL (ref 80.0–100.0)
Platelets: 420 10*3/uL — ABNORMAL HIGH (ref 150–400)
RBC: 2.65 MIL/uL — ABNORMAL LOW (ref 4.22–5.81)
RDW: 19.6 % — ABNORMAL HIGH (ref 11.5–15.5)
WBC: 6 10*3/uL (ref 4.0–10.5)
nRBC: 0.3 % — ABNORMAL HIGH (ref 0.0–0.2)

## 2024-03-20 MED ORDER — DIPHENHYDRAMINE HCL 25 MG PO CAPS
25.0000 mg | ORAL_CAPSULE | Freq: Once | ORAL | Status: AC
  Administered 2024-03-20: 25 mg via ORAL
  Filled 2024-03-20: qty 1

## 2024-03-20 MED ORDER — LACTULOSE 10 GM/15ML PO SOLN
30.0000 g | Freq: Once | ORAL | Status: AC
  Administered 2024-03-20: 30 g via ORAL
  Filled 2024-03-20: qty 45

## 2024-03-20 NOTE — Plan of Care (Signed)

## 2024-03-20 NOTE — Progress Notes (Addendum)
 Patient ID: Timothy Marks, male   DOB: 09-Jan-1991, 34 y.o.   MRN: [id number] Subjective: Timothy Marks is a 34 y.o. male with medical history significant for sickle cell disease, Crohn's disease, chronic pain syndrome, history of DVT on Eliquis , depression/anxiety, avascular necrosis of right shoulder who presented to the ED for evaluation of sickle cell pain.   Patient is reporting pain in his right lower abdominal quadrant  today he rates his overall pain at 9/10 with no improvement.  Denies nausea, vomiting, diarrhea, chest pain, headache.  Objective:  Vital signs in last 24 hours:  Vitals:   03/19/24 2359 03/20/24 0540 03/20/24 0553 03/20/24 0930  BP: (!) 96/51  (!) 126/59   Pulse: (!) 58  71   Resp: 14 15 16 18   Temp:   97.9 F (36.6 C)   TempSrc:      SpO2: 100% (!) 2% 100% 100%  Weight:      Height:        Intake/Output from previous day:   Intake/Output Summary (Last 24 hours) at 03/20/2024 1205 Last data filed at 03/20/2024 0700 Gross per 24 hour  Intake 72.9 ml  Output 1900 ml  Net -1827.1 ml    Physical Exam: General: Alert, awake, oriented x3, in no acute distress.  HEENT: [place]/AT [place]ERL, EOMI Neck: Trachea midline,  no masses, no thyromegal,y no JVD, no carotid bruit OROPHARYNX:  Moist, No exudate/ erythema/lesions.  Heart: Regular rate and rhythm, without murmurs, rubs, gallops, PMI non-displaced, no heaves or thrills on palpation.  Lungs: Clear to auscultation, no wheezing or rhonchi noted. No increased vocal fremitus resonant to percussion  Abdomen: tender, nondistended, positive bowel sounds, no masses no hepatosplenomegaly noted..  Neuro: No focal neurological deficits noted cranial nerves II through XII grossly intact. DTRs 2+ bilaterally upper and lower extremities. Strength 5 out of 5 in bilateral upper and lower extremities. Musculoskeletal: left hip swelling, No erythema around joints, no spinal tenderness noted. Psychiatric: Patient alert and oriented  x3, good insight and cognition, good recent to remote recall. Lymph node survey: No cervical axillary or inguinal lymphadenopathy noted.  Lab Results:  Basic Metabolic Panel:    Component Value Date/Time   NA 136 03/18/2024 0410   NA 139 01/03/2024 0950   K 4.5 03/18/2024 0410   CL 101 03/18/2024 0410   CO2 28 03/18/2024 0410   BUN 12 03/18/2024 0410   BUN 7 01/03/2024 0950   CREATININE 0.67 03/18/2024 0410   GLUCOSE 111 (H) 03/18/2024 0410   CALCIUM 9.1 03/18/2024 0410   CBC:    Component Value Date/Time   WBC 6.0 03/20/2024 0448   HGB 7.6 (L) 03/20/2024 0448   HGB 7.5 (L) 01/03/2024 0950   HCT 21.9 (L) 03/20/2024 0448   HCT 23.5 (L) 01/03/2024 0950   PLT 420 (H) 03/20/2024 0448   PLT 497 (H) 01/03/2024 0950   MCV 82.6 03/20/2024 0448   MCV 95 01/03/2024 0950   NEUTROABS 6.3 03/13/2024 1317   NEUTROABS 5.1 12/04/2023 0918   LYMPHSABS 3.1 03/13/2024 1317   LYMPHSABS 1.7 12/04/2023 0918   MONOABS 1.2 (H) 03/13/2024 1317   EOSABS 0.2 03/13/2024 1317   EOSABS 0.3 12/04/2023 0918   BASOSABS 0.0 03/13/2024 1317   BASOSABS 0.0 12/04/2023 0918    No results found for this or any previous visit (from the past 240 hours).  Studies/Results: VAS US  LOWER EXTREMITY VENOUS (DVT) Result Date: 03/18/2024  Lower Venous DVT Study Patient Name:  Timothy Marks  Date of Exam:   03/18/2024 Medical Rec #: [id number]         Accession #:    [id number] Date of Birth: 10/10/1990         Patient Gender: M Patient Age:   3 years Exam Location:  [facility] Procedure:      VAS US  LOWER EXTREMITY VENOUS (DVT) Referring Phys: HOMER COVER --------------------------------------------------------------------------------  Indications: Pain.  Risk Factors: None identified. Limitations: Patient positioning, patient pain tolerance. Comparison Study: No prior studies. Performing Technologist: Cordella Collet RVT  Examination Guidelines: A complete evaluation includes B-mode imaging, spectral  Doppler, color Doppler, and power Doppler as needed of all accessible portions of each vessel. Bilateral testing is considered an integral part of a complete examination. Limited examinations for reoccurring indications may be performed as noted. The reflux portion of the exam is performed with the patient in reverse Trendelenburg.  +-----+---------------+---------+-----------+----------+--------------+ RIGHTCompressibilityPhasicitySpontaneityPropertiesThrombus Aging +-----+---------------+---------+-----------+----------+--------------+ CFV  Full           Yes      Yes                                 +-----+---------------+---------+-----------+----------+--------------+   +---------+---------------+---------+-----------+----------+--------------+ LEFT     CompressibilityPhasicitySpontaneityPropertiesThrombus Aging +---------+---------------+---------+-----------+----------+--------------+ CFV      Full           Yes      Yes                                 +---------+---------------+---------+-----------+----------+--------------+ SFJ      Full                                                        +---------+---------------+---------+-----------+----------+--------------+ FV Prox  Full                                                        +---------+---------------+---------+-----------+----------+--------------+ FV Mid   Full                                                        +---------+---------------+---------+-----------+----------+--------------+ FV DistalFull                                                        +---------+---------------+---------+-----------+----------+--------------+ PFV      Full                                                        +---------+---------------+---------+-----------+----------+--------------+ POP      Full  Yes      Yes                                  +---------+---------------+---------+-----------+----------+--------------+ PTV      Full                                                        +---------+---------------+---------+-----------+----------+--------------+ PERO     Full                                                        +---------+---------------+---------+-----------+----------+--------------+    Summary: RIGHT: - No evidence of common femoral vein obstruction.   LEFT: - There is no evidence of deep vein thrombosis in the lower extremity.  - No cystic structure found in the popliteal fossa.  *See table(s) above for measurements and observations. Electronically signed by Fonda Rim on 03/18/2024 at 5:11:12 PM.    Final    DG HIP UNILAT WITH PELVIS 2-3 VIEWS LEFT Result Date: 03/18/2024 CLINICAL DATA:  Left hip pain.  Sickle cell anemia. EXAM: DG HIP (WITH OR WITHOUT PELVIS) 2-3V LEFT COMPARISON:  10/02/2022 lumbar spine radiographs. 02/08/2014 left hip radiographs. FINDINGS: AP view of the pelvis and AP/frog leg views of the left hip. Femoral heads are located. 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 Sacroiliac joints are symmetric. No femoral head collapse. IMPRESSION: No acute osseous abnormality. Proximal femoral chronic infarcts, consistent with sickle cell disease. Electronically Signed   By: Rockey Kilts M.D.   On: 03/18/2024 15:55   DG Shoulder Left Result Date: 03/18/2024 CLINICAL DATA:  Left shoulder pain. No trauma history is submitted. Sickle cell anemia. EXAM: LEFT SHOULDER - 2+ VIEW COMPARISON:  05/24/2018 FINDINGS: Visualized portion of the left hemithorax is normal. No acute fracture or dislocation. Suspect humeral head sclerosis indicative of prior infarct. Chronic. IMPRESSION: No acute osseous abnormality. Electronically Signed   By: Rockey Kilts M.D.   On: 03/18/2024 15:52     Medications: Scheduled Meds:  apixaban   5 mg Oral BID   folic acid   1 mg Oral Daily    hydroxyurea   500 mg Oral BID   senna-docusate  1 tablet Oral BID   Continuous Infusions: PRN Meds:.methocarbamol  (ROBAXIN ) injection, ondansetron  **OR** ondansetron  (ZOFRAN ) IV, oxyCODONE , polyethylene glycol  Consultants: None  Procedures: None  Antibiotics: None  Assessment/Plan: Principal Problem:   Sickle cell anemia with crisis (HCC) Active Problems:   Aggressive behavior   Constipation   Chronic pain syndrome   Anemia of chronic disease   History of DVT (deep vein thrombosis)   Acute hip pain, left   Hb Sickle Cell Disease with Pain crisis: Reduce IVF 0.45% Saline KVO, discontinue weight based Dilaudid  PCA.  Patient's pain has not improved since day 1.  IV Toradol  15 mg Q 6 H for 5 days. [Completed].  Continue oral home pain medications as ordered. Monitor vitals very closely, Re-evaluate pain scale regularly, 2 L of Oxygen  by [place]. Patient encouraged to ambulate on the hallway today.    Constipation: No BM in 6 days, Lactulose  as prescribed., increase  hydration, encouraged to ambulate.   Acute hip pain, left: Doppler/X-ray completed and shows; No acute osseous abnormality. Proximal femoral chronic infarcts, consistent with sickle cell disease. continue current pain management.    Anemia of Chronic Disease: Hemoglobin within patient's baseline, no clinical indication for transfusion.  Will continue to monitor daily CBC.  Chronic pain Syndrome: Continue oral home pain medication.  History of DVT: Continue Eliquis  as prescribed.  Aggressive behavior: Patient is calm today.  Code Status: Full Code Family Communication: N/A Disposition Plan: Not yet ready for discharge  Homer CHRISTELLA Cover NP  If 7PM-7AM, please contact night-coverage.  03/20/2024, 12:05 PM  LOS: 6 days

## 2024-03-20 NOTE — Progress Notes (Addendum)
 Patient asking for IV pain medication. I offered him his Oxy 10 mg. He said he would take it if he can have the IV medication after. Sent a message to Homer Cover, NP who is caring for Mr. Gracy. NP said no IV pain medication at this time.   I went and told Mr. Goggins no to the IV pain medication and offered the Oxy again, which he did not want. As I was walking out of the room he said he had already taken his own under his breath.  NP notified.

## 2024-03-21 LAB — CBC
HCT: 21.4 % — ABNORMAL LOW (ref 39.0–52.0)
Hemoglobin: 7.3 g/dL — ABNORMAL LOW (ref 13.0–17.0)
MCH: 28.4 pg (ref 26.0–34.0)
MCHC: 34.1 g/dL (ref 30.0–36.0)
MCV: 83.3 fL (ref 80.0–100.0)
Platelets: 435 10*3/uL — ABNORMAL HIGH (ref 150–400)
RBC: 2.57 MIL/uL — ABNORMAL LOW (ref 4.22–5.81)
RDW: 20.9 % — ABNORMAL HIGH (ref 11.5–15.5)
WBC: 7.9 10*3/uL (ref 4.0–10.5)
nRBC: 0.8 % — ABNORMAL HIGH (ref 0.0–0.2)

## 2024-03-21 NOTE — Plan of Care (Signed)
 " Problem: Education: Goal: Knowledge of vaso-occlusive preventative measures will improve 03/21/2024 1107 by Hogan-Nutting, Burnard HERO, RN Outcome: Adequate for Discharge 03/21/2024 1104 by Reesa Burnard HERO, RN Outcome: Progressing Goal: Awareness of infection prevention will improve 03/21/2024 1107 by Reesa Burnard HERO, RN Outcome: Adequate for Discharge 03/21/2024 1104 by Reesa Burnard HERO, RN Outcome: Progressing Goal: Awareness of signs and symptoms of anemia will improve 03/21/2024 1107 by Reesa Burnard HERO, RN Outcome: Adequate for Discharge 03/21/2024 1104 by Reesa Burnard HERO, RN Outcome: Progressing Goal: Long-term complications will improve 03/21/2024 1107 by Reesa Burnard HERO, RN Outcome: Adequate for Discharge 03/21/2024 1104 by Reesa Burnard HERO, RN Outcome: Progressing   Problem: Self-Care: Goal: Ability to incorporate actions that prevent/reduce pain crisis will improve 03/21/2024 1107 by Reesa Burnard HERO, RN Outcome: Adequate for Discharge 03/21/2024 1104 by Reesa Burnard HERO, RN Outcome: Progressing   Problem: Bowel/Gastric: Goal: Gut motility will be maintained 03/21/2024 1107 by Reesa Burnard HERO, RN Outcome: Adequate for Discharge 03/21/2024 1104 by Reesa Burnard HERO, RN Outcome: Progressing   Problem: Tissue Perfusion: Goal: Complications related to inadequate tissue perfusion will be avoided or minimized 03/21/2024 1107 by Reesa Burnard HERO, RN Outcome: Adequate for Discharge 03/21/2024 1104 by Reesa Burnard HERO, RN Outcome: Progressing   Problem: Respiratory: Goal: Pulmonary complications will be avoided or minimized 03/21/2024 1107 by Reesa Burnard HERO, RN Outcome: Adequate for Discharge 03/21/2024 1104 by Reesa Burnard HERO, RN Outcome: Progressing Goal: Acute Chest Syndrome will be identified early to prevent complications 03/21/2024 1107 by Reesa Burnard HERO, RN Outcome: Adequate for  Discharge 03/21/2024 1104 by Reesa Burnard HERO, RN Outcome: Progressing   Problem: Fluid Volume: Goal: Ability to maintain a balanced intake and output will improve 03/21/2024 1107 by Reesa Burnard HERO, RN Outcome: Adequate for Discharge 03/21/2024 1104 by Reesa Burnard HERO, RN Outcome: Progressing   Problem: Sensory: Goal: Pain level will decrease with appropriate interventions 03/21/2024 1107 by Reesa Burnard HERO, RN Outcome: Adequate for Discharge 03/21/2024 1104 by Reesa Burnard HERO, RN Outcome: Progressing   Problem: Health Behavior: Goal: Postive changes in compliance with treatment and prescription regimens will improve 03/21/2024 1107 by Reesa Burnard HERO, RN Outcome: Adequate for Discharge 03/21/2024 1104 by Reesa Burnard HERO, RN Outcome: Progressing   Problem: Education: Goal: Knowledge of General Education information will improve Description: Including pain rating scale, medication(s)/side effects and non-pharmacologic comfort measures 03/21/2024 1107 by Reesa Burnard HERO, RN Outcome: Adequate for Discharge 03/21/2024 1104 by Reesa Burnard HERO, RN Outcome: Progressing   Problem: Health Behavior/Discharge Planning: Goal: Ability to manage health-related needs will improve 03/21/2024 1107 by Reesa Burnard HERO, RN Outcome: Adequate for Discharge 03/21/2024 1104 by Reesa Burnard HERO, RN Outcome: Progressing   Problem: Clinical Measurements: Goal: Ability to maintain clinical measurements within normal limits will improve 03/21/2024 1107 by Reesa Burnard HERO, RN Outcome: Adequate for Discharge 03/21/2024 1104 by Reesa Burnard HERO, RN Outcome: Progressing Goal: Will remain free from infection 03/21/2024 1107 by Reesa Burnard HERO, RN Outcome: Adequate for Discharge 03/21/2024 1104 by Reesa Burnard HERO, RN Outcome: Progressing Goal: Diagnostic test results will improve 03/21/2024 1107 by Reesa Burnard HERO,  RN Outcome: Adequate for Discharge 03/21/2024 1104 by Reesa Burnard HERO, RN Outcome: Progressing Goal: Respiratory complications will improve 03/21/2024 1107 by Reesa Burnard HERO, RN Outcome: Adequate for Discharge 03/21/2024 1104 by Reesa Burnard HERO, RN Outcome: Progressing Goal: Cardiovascular complication will be avoided 03/21/2024 1107 by Reesa Burnard HERO, RN Outcome: Adequate for Discharge 03/21/2024 1104 by Reesa Burnard HERO, RN Outcome:  Progressing   Problem: Activity: Goal: Risk for activity intolerance will decrease 03/21/2024 1107 by Hogan-Nutting, Burnard HERO, RN Outcome: Adequate for Discharge 03/21/2024 1104 by Reesa Burnard HERO, RN Outcome: Progressing   Problem: Nutrition: Goal: Adequate nutrition will be maintained 03/21/2024 1107 by Reesa Burnard HERO, RN Outcome: Adequate for Discharge 03/21/2024 1104 by Reesa Burnard HERO, RN Outcome: Progressing   Problem: Coping: Goal: Level of anxiety will decrease 03/21/2024 1107 by Reesa Burnard HERO, RN Outcome: Adequate for Discharge 03/21/2024 1104 by Reesa Burnard HERO, RN Outcome: Progressing   Problem: Elimination: Goal: Will not experience complications related to bowel motility 03/21/2024 1107 by Reesa Burnard HERO, RN Outcome: Adequate for Discharge 03/21/2024 1104 by Reesa Burnard HERO, RN Outcome: Progressing Goal: Will not experience complications related to urinary retention 03/21/2024 1107 by Reesa Burnard HERO, RN Outcome: Adequate for Discharge 03/21/2024 1104 by Reesa Burnard HERO, RN Outcome: Progressing   Problem: Pain Managment: Goal: General experience of comfort will improve and/or be controlled 03/21/2024 1107 by Reesa Burnard HERO, RN Outcome: Adequate for Discharge 03/21/2024 1104 by Reesa Burnard HERO, RN Outcome: Progressing   Problem: Safety: Goal: Ability to remain free from injury will improve 03/21/2024 1107 by Reesa Burnard HERO,  RN Outcome: Adequate for Discharge 03/21/2024 1104 by Reesa Burnard HERO, RN Outcome: Progressing   Problem: Skin Integrity: Goal: Risk for impaired skin integrity will decrease 03/21/2024 1107 by Reesa Burnard HERO, RN Outcome: Adequate for Discharge 03/21/2024 1104 by Reesa Burnard HERO, RN Outcome: Progressing   "

## 2024-03-21 NOTE — Plan of Care (Signed)

## 2024-03-21 NOTE — Care Management Important Message (Signed)
 Important Message  Patient Details IM Letter given. Name: DEMORIO SEELEY MRN: [id number] Date of Birth: 11-10-1990   Important Message Given:  Yes - Medicare IM     Jaion Lagrange 03/21/2024, 10:13 AM

## 2024-03-21 NOTE — Discharge Summary (Cosign Needed)
 " Physician Discharge Summary  Timothy Marks FMW:[id number] DOB: 15-Jan-1991 DOA: 03/13/2024  PCP: Paseda, Folashade R, FNP  Admit date: 03/13/2024  Discharge date: 03/21/2024  Discharge Diagnoses:  Principal Problem:   Sickle cell anemia with crisis [facility]) Active Problems:   Aggressive behavior   Constipation   Chronic pain syndrome   Anemia of chronic disease   History of DVT (deep vein thrombosis)   Acute hip pain, left   Discharge Condition: Stable  Disposition:   Follow-up Information     Paseda, Folashade R, FNP.   Specialty: Nurse Practitioner Why: If symptoms worsen Contact information: [address] [place], [place] [address] [phone]                Pt is discharged home in good condition and is to follow up with Paseda, Folashade R, FNP this week to have labs evaluated. Timothy Marks is instructed to increase activity slowly and balance with rest for the next few days, and use prescribed medication to complete treatment of pain  Diet: Regular Wt Readings from Last 3 Encounters:  03/14/24 63.9 kg  01/03/24 56.7 kg  12/30/23 61.2 kg    History of present illness:  Timothy Marks is a 34 y.o. male with medical history significant for sickle cell disease, Crohn's disease, chronic pain syndrome, history of DVT on Eliquis , depression/anxiety, avascular necrosis of right shoulder who presented to the ED for evaluation of sickle cell pain.  Patient reports symptoms of sickle cell pain crisis beginning yesterday.  He says pain is all over but more pronounced in the right arm, left leg, and lower back.  He takes Percocet 10-325 mg every 4 hours as needed for pain management as an outpatient.  He says he took his last Percocet prior to ED Visit.  He was seen at the [facility] and [facility] EDs for the same yesterday. He denies chest pain, dyspnea, fevers, chills, diaphoresis.  He also is reporting left knee pain which he is concerned  feels a little different than his typical sickle cell pain.  No recent injury or swelling to the knee.   ED Course  Labs/Imaging on admission:  Initial vitals showed BP 120/84, pulse 72, RR 16, temp 98.4 F, SpO2 99% on room air. Labs showed WBC 10.9, hemoglobin 11.7, platelets 403, sodium 139, potassium 3.9, bicarb 26, BUN <5, creatinine 0.67, serum glucose 87, total bilirubin 2.0. Portable chest x-ray negative for focal consolidation, edema, effusion. Patient was given 500 mLs LR, IV Dilaudid  2 mg x 3, IV Toradol  15 mg, Oxy IR 15 mg. Patient was admitted for sickle cell pain crisis management.  Hospital Course:  Patient was admitted for sickle cell pain crisis and managed appropriately with IVF, IV Dilaudid  via PCA, patient is reporting improved pain this morning.  Hemoglobin within baseline all through admission.  He has no new concerns.  Patient is ambulating without assistance, and tolerating p.o. without nausea or vomiting. Patient was therefore discharged home today in a hemodynamically stable condition.   Timothy Marks will follow-up with PCP within 1 week of this discharge. Timothy Marks was counseled extensively about nonpharmacologic means of pain management, patient verbalized understanding and was appreciative of  the care received during this admission.   We discussed the need for good hydration, monitoring of hydration status, avoidance of heat, cold, stress, and infection triggers. We discussed the need to be adherent with taking Hydrea  his home medications. Patient was reminded of the need to seek  medical attention immediately if any symptom of bleeding, anemia, or infection occurs.  Discharge Exam: Vitals:   03/20/24 2045 03/21/24 0401  BP: (!) 115/55 110/61  Pulse: 91 97  Resp: 15 17  Temp: 98.6 F (37 C) 98.7 F (37.1 C)  SpO2: 98% 98%   Vitals:   03/20/24 1208 03/20/24 1823 03/20/24 2045 03/21/24 0401  BP: 106/70 123/73 (!) 115/55 110/61  Pulse: 73 85 91 97  Resp:   15 17   Temp: 98.4 F (36.9 C) 98.9 F (37.2 C) 98.6 F (37 C) 98.7 F (37.1 C)  TempSrc: Oral Oral  Oral  SpO2: 100% 97% 98% 98%  Weight:      Height:        General appearance : Awake, alert, not in any distress. Speech Clear. Not toxic looking HEENT: Atraumatic and Normocephalic, pupils equally reactive to light and accomodation Neck: Supple, no JVD. No cervical lymphadenopathy.  Chest: Good air entry bilaterally, no added sounds  CVS: S1 S2 regular, no murmurs.  Abdomen: Bowel sounds present, Non tender and not distended with no gaurding, rigidity or rebound. Extremities: B/L Lower Ext shows no edema, both legs are warm to touch Neurology: Awake alert, and oriented X 3, CN II-XII intact, Non focal Skin: No Rash  Discharge Instructions  Discharge Instructions     Call MD for:  severe uncontrolled pain   Complete by: As directed    Call MD for:  temperature >100.4   Complete by: As directed    Increase activity slowly   Complete by: As directed       Allergies as of 03/21/2024       Reactions   Buprenorphine Hcl Hives   Levaquin  [levofloxacin ] Itching   Meperidine Rash   Morphine  Hives, Other (See Comments)   Has taken MS IR at home??   Tramadol Hives   Vancomycin Itching   Zosyn [piperacillin Sod-tazobactam So] Itching, Rash, Other (See Comments)   Has taken rocephin  in the past        Medication List     TAKE these medications    cetirizine 10 MG tablet Commonly known as: ZYRTEC Take 10 mg by mouth daily as needed for allergies or rhinitis.   diphenhydrAMINE  25 mg capsule Commonly known as: BENADRYL  Take 1 capsule (25 mg total) by mouth every 6 (six) hours as needed for itching.   Eliquis  5 MG Tabs tablet Generic drug: apixaban  Take 1 tablet (5 mg total) by mouth 2 (two) times daily.   ergocalciferol  1.25 MG (50000 UT) capsule Commonly known as: VITAMIN D2 Take 1 capsule (50,000 Units total) by mouth once a week. What changed: when to take this    folic acid  1 MG tablet Commonly known as: FOLVITE  Take 1 tablet (1 mg total) by mouth daily.   hydroxyurea  500 MG capsule Commonly known as: HYDREA  Take 1 capsule (500 mg total) by mouth 2 (two) times daily. May take with food to minimize GI side effects.   ibuprofen  800 MG tablet Commonly known as: ADVIL  Take 1 tablet (800 mg total) by mouth every 8 (eight) hours as needed. What changed: reasons to take this   naloxone  4 MG/0.1ML Liqd nasal spray kit Commonly known as: NARCAN  Place 1 spray into the nose once as needed (AS DIRECTED).   oxyCODONE -acetaminophen  10-325 MG tablet Commonly known as: PERCOCET Take 1 tablet by mouth every 4 (four) hours as needed for up to 15 days for pain.  The results of significant diagnostics from this hospitalization (including imaging, microbiology, ancillary and laboratory) are listed below for reference.    Significant Diagnostic Studies: VAS US  LOWER EXTREMITY VENOUS (DVT) Result Date: 03/18/2024  Lower Venous DVT Study Patient Name:  DAMONT BALLES  Date of Exam:   03/18/2024 Medical Rec #: [id number]         Accession #:    [id number] Date of Birth: 02/23/1990         Patient Gender: M Patient Age:   1 years Exam Location:  [facility] Procedure:      VAS US  LOWER EXTREMITY VENOUS (DVT) Referring Phys: HOMER COVER --------------------------------------------------------------------------------  Indications: Pain.  Risk Factors: None identified. Limitations: Patient positioning, patient pain tolerance. Comparison Study: No prior studies. Performing Technologist: Cordella Collet RVT  Examination Guidelines: A complete evaluation includes B-mode imaging, spectral Doppler, color Doppler, and power Doppler as needed of all accessible portions of each vessel. Bilateral testing is considered an integral part of a complete examination. Limited examinations for reoccurring indications may be performed as noted. The reflux portion of the exam  is performed with the patient in reverse Trendelenburg.  +-----+---------------+---------+-----------+----------+--------------+ RIGHTCompressibilityPhasicitySpontaneityPropertiesThrombus Aging +-----+---------------+---------+-----------+----------+--------------+ CFV  Full           Yes      Yes                                 +-----+---------------+---------+-----------+----------+--------------+   +---------+---------------+---------+-----------+----------+--------------+ LEFT     CompressibilityPhasicitySpontaneityPropertiesThrombus Aging +---------+---------------+---------+-----------+----------+--------------+ CFV      Full           Yes      Yes                                 +---------+---------------+---------+-----------+----------+--------------+ SFJ      Full                                                        +---------+---------------+---------+-----------+----------+--------------+ FV Prox  Full                                                        +---------+---------------+---------+-----------+----------+--------------+ FV Mid   Full                                                        +---------+---------------+---------+-----------+----------+--------------+ FV DistalFull                                                        +---------+---------------+---------+-----------+----------+--------------+ PFV      Full                                                        +---------+---------------+---------+-----------+----------+--------------+  POP      Full           Yes      Yes                                 +---------+---------------+---------+-----------+----------+--------------+ PTV      Full                                                        +---------+---------------+---------+-----------+----------+--------------+ PERO     Full                                                         +---------+---------------+---------+-----------+----------+--------------+    Summary: RIGHT: - No evidence of common femoral vein obstruction.   LEFT: - There is no evidence of deep vein thrombosis in the lower extremity.  - No cystic structure found in the popliteal fossa.  *See table(s) above for measurements and observations. Electronically signed by Fonda Rim on 03/18/2024 at 5:11:12 PM.    Final    DG HIP UNILAT WITH PELVIS 2-3 VIEWS LEFT Result Date: 03/18/2024 CLINICAL DATA:  Left hip pain.  Sickle cell anemia. EXAM: DG HIP (WITH OR WITHOUT PELVIS) 2-3V LEFT COMPARISON:  10/02/2022 lumbar spine radiographs. 02/08/2014 left hip radiographs. FINDINGS: AP view of the pelvis and AP/frog leg views of the left hip. Femoral heads are located. 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 Sacroiliac joints are symmetric. No femoral head collapse. IMPRESSION: No acute osseous abnormality. Proximal femoral chronic infarcts, consistent with sickle cell disease. Electronically Signed   By: Rockey Kilts M.D.   On: 03/18/2024 15:55   DG Shoulder Left Result Date: 03/18/2024 CLINICAL DATA:  Left shoulder pain. No trauma history is submitted. Sickle cell anemia. EXAM: LEFT SHOULDER - 2+ VIEW COMPARISON:  05/24/2018 FINDINGS: Visualized portion of the left hemithorax is normal. No acute fracture or dislocation. Suspect humeral head sclerosis indicative of prior infarct. Chronic. IMPRESSION: No acute osseous abnormality. Electronically Signed   By: Rockey Kilts M.D.   On: 03/18/2024 15:52   DG Knee 1-2 Views Left Result Date: 03/13/2024 EXAM: 1 or 2 VIEW(S) XRAY OF THE LEFT KNEE 03/13/2024 08:38:00 PM COMPARISON: Comparison with 05/07/2014. CLINICAL HISTORY: Left knee pain. FINDINGS: BONES AND JOINTS: No acute fracture. No malalignment. No focal bone destruction. Bone cortex appears intact. Joint spaces are normal. No significant joint effusion. Intramedullary calcifications  demonstrated in the distal femur and proximal tibial shaft. 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 SOFT TISSUES: Unremarkable. IMPRESSION: 1. No acute fracture or dislocation. 2. 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 Electronically signed by: Elsie Gravely MD 03/13/2024 08:47 PM EST RP Workstation: [id number]   DG Chest Port 1 View Result Date: 03/13/2024 CLINICAL DATA:  Chest pain.  Sickle cell. EXAM: PORTABLE CHEST 1 VIEW COMPARISON:  Chest radiograph dated 12/30/2023. FINDINGS: No focal consolidation, pleural effusion, or pneumothorax. The cardiac silhouette is within normal limits. No acute osseous pathology. Osseous stigmata of sickle cell. IMPRESSION: No  active disease. Electronically Signed   By: Vanetta Chou M.D.   On: 03/13/2024 13:39    Microbiology: No results found for this or any previous visit (from the past 240 hours).   Labs: Basic Metabolic Panel: Recent Labs  Lab 03/15/24 1306 03/18/24 0410  NA 136 136  K 4.3 4.5  CL 101 101  CO2 26 28  GLUCOSE 92 111*  BUN 9 12  CREATININE 0.65 0.67  CALCIUM 8.9 9.1   Liver Function Tests: Recent Labs  Lab 03/15/24 1306 03/18/24 0410  AST 39 43*  ALT 26 19  ALKPHOS 95 86  BILITOT 2.3* 1.8*  PROT 7.1 7.2  ALBUMIN 3.9 4.0   No results for input(s): LIPASE, AMYLASE in the last 168 hours. No results for input(s): AMMONIA in the last 168 hours. CBC: Recent Labs  Lab 03/16/24 0523 03/18/24 0410 03/20/24 0448 03/21/24 0446  WBC 8.2 8.4 6.0 7.9  HGB 9.3* 8.1* 7.6* 7.3*  HCT 26.7* 23.7* 21.9* 21.4*  MCV 84.8 84.6 82.6 83.3  PLT 338 366 420* 435*   Cardiac Enzymes: No results for input(s): CKTOTAL, CKMB, CKMBINDEX, TROPONINI in the last 168 hours. BNP: Invalid input(s): POCBNP CBG: No results for input(s): GLUCAP in the last 168 hours.  Time coordinating  discharge: 50 minutes  Signed:  Homer CHRISTELLA Cover NP  [facility] 03/21/2024, 11:55 AM "

## 2024-03-21 NOTE — TOC Transition Note (Signed)
 Transition of Care [facility]) - Discharge Note   Patient Details  Name: Timothy Marks MRN: [id number] Date of Birth: 29-Nov-1990  [facility] CM/SW Contact:  Doneta Glenys DASEN, RN Phone Number: 03/21/2024, 11:50 AM   Clinical Narrative:    Patient ready for discharge. Requested taxi voucher. CM offered bus pass. Patient was able to find our transport for discharge. IP CM signing off.   Final next level of care: Home/Self Care Barriers to Discharge: Barriers Resolved   Patient Goals and CMS Choice Patient states their goals for this hospitalization and ongoing recovery are:: Home   Choice offered to / list presented to : NA      Discharge Placement                       Discharge Plan and Services Additional resources added to the After Visit Summary for   In-house Referral: NA Discharge Planning Services: CM Consult            DME Arranged: N/A DME Agency: NA       HH Arranged: NA HH Agency: NA        Social Drivers of Health (SDOH) Interventions SDOH Screenings   Food Insecurity: No Food Insecurity (03/14/2024)  Housing: Low Risk (03/14/2024)  Transportation Needs: No Transportation Needs (03/14/2024)  Utilities: Not At Risk (03/14/2024)  Alcohol  Screen: Low Risk (05/04/2023)  Depression (PHQ2-9): Low Risk (01/03/2024)  Financial Resource Strain: Low Risk (12/01/2023)  Physical Activity: Insufficiently Active (12/01/2023)  Social Connections: Unknown (12/01/2023)  Stress: No Stress Concern Present (12/01/2023)  Tobacco Use: Medium Risk (03/14/2024)     Readmission Risk Interventions    03/18/2024   11:45 AM 11/21/2023   10:35 AM 02/28/2023   11:30 AM  Readmission Risk Prevention Plan  Transportation Screening Complete Complete Complete  PCP or Specialist Appt within 5-7 Days  Complete   PCP or Specialist Appt within 3-5 Days Complete  Complete  Home Care Screening  Complete   Medication Review (RN CM)  Not Complete   HRI or Home Care  Consult Complete  Complete  Social Work Consult for Recovery Care Planning/Counseling Complete  Complete  Palliative Care Screening Not Applicable  Not Applicable  Medication Review Oceanographer) Complete  Complete

## 2024-03-21 NOTE — TOC Initial Note (Signed)
 Transition of Care [facility]) - Initial/Assessment Note    Patient Details  Name: Timothy Marks MRN: [id number] Date of Birth: 12-17-1990  [facility] CM/SW Contact:    Timothy Glenys DASEN, RN Phone Number:  Clinical Narrative:                 PTA lives will Marks,Timothy Mother, Emergency Contact [phone]. Denies DME.HH and SDOH needs. May need assistance with transport at discharge. Both his mother and girlfriend work during the day. IP CM following  Expected Discharge Plan: Home/Self Care Barriers to Discharge: Transportation, Continued Medical Work up   Patient Goals and CMS Choice Patient states their goals for this hospitalization and ongoing recovery are:: Home   Choice offered to / list presented to : NA      Expected Discharge Plan and Services In-house Referral: NA Discharge Planning Services: CM Consult   Living arrangements for the past 2 months: Apartment Expected Discharge Date: 03/21/24               DME Arranged: N/A DME Agency: NA       HH Arranged: NA HH Agency: NA        Prior Living Arrangements/Services Living arrangements for the past 2 months: Apartment Lives with:: Parents Patient language and need for interpreter reviewed:: Yes Do you feel safe going back to the place where you live?: Yes      Need for Family Participation in Patient Care: Yes (Comment) Care giver support system in place?: Yes (comment) Current home services:  (NA) Criminal Activity/Legal Involvement Pertinent to Current Situation/Hospitalization: No - Comment as needed  Activities of Daily Living   ADL Screening (condition at time of admission) Independently performs ADLs?: Yes (appropriate for developmental age) Is the patient deaf or have difficulty hearing?: No Does the patient have difficulty seeing, even when wearing glasses/contacts?: No Does the patient have difficulty concentrating, remembering, or making decisions?: No  Permission  Sought/Granted Permission sought to share information with : Case Manager Permission granted to share information with : Yes, Verbal Permission Granted  Share Information with NAME: Timothy Marks,Timothy  Mother, Emergency Contact  [phone]           Emotional Assessment Appearance:: Appears stated age Attitude/Demeanor/Rapport: Reactive, Angry Affect (typically observed): Appropriate, Defensive, Irritable Orientation: : Oriented to Self, Oriented to Place, Oriented to  Time, Oriented to Situation Alcohol  / Substance Use: Not Applicable Psych Involvement: No (comment)  Admission diagnosis:  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] Sickle cell pain crisis (HCC) [D57.00] Patient Active Problem List   Diagnosis Date Noted   Acute hip pain, left 03/18/2024   Encounter for examination following treatment at hospital 01/03/2024   Annual physical exam 12/04/2023   Impacted cerumen, right ear 12/04/2023   Insomnia 03/21/2023   Anxiety 03/21/2023   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   Arm pain, diffuse, right 07/02/2021   Marijuana smoker 01/27/2021   Lab test positive for detection of COVID-19 virus 01/23/2020   Medication management 01/16/2020   Underweight 06/14/2019   Generalized weakness 03/31/2019   Sickle-cell crisis (HCC) 03/24/2019   SIRS (systemic inflammatory response syndrome) (HCC) 02/10/2019   Abnormal liver function 11/04/2018   Sickle cell anemia with crisis (HCC) 05/22/2018   Acute bilateral low back pain without sciatica    Other chronic pain    Anemia of chronic disease    Socially inappropriate behavior 12/02/2017   Thrombocythemia    Physical deconditioning    Agitation  Chronic pain syndrome    Recurrent cold sores    Fever of unknown origin    Drug-seeking behavior 08/30/2017   Pain    Sickle cell crisis (HCC) 07/16/2017   Adjustment disorder with mixed disturbance of emotions and conduct    Bilateral pulmonary  infiltrates on chest x-ray 11/21/2016   History of DVT (deep vein thrombosis) 11/17/2016   Suicidal ideations 10/11/2016   Reticulocytosis 10/09/2016   DVT (deep venous thrombosis) (HCC) 08/02/2016   Has difficulty accessing primary care provider for most visits 07/11/2015   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   Hordeolum externum of left upper eyelid 05/31/2015   Major depressive disorder with current active episode 11/09/2014   Hb-SS disease with acute chest syndrome (HCC) 11/08/2014   Pneumonia of right upper lobe due to infectious organism 11/07/2014   Constipation 08/03/2014   Leukocytosis 06/30/2014   h/o Priapism 05/06/2014   Protein-calorie malnutrition, severe 04/09/2014   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 [place] [address] Phone: [phone] Fax: [phone]     Social Drivers of Health (SDOH) Social History: SDOH Screenings   Food Insecurity: No Food Insecurity (03/14/2024)  Housing: Low Risk (03/14/2024)  Transportation Needs: No Transportation Needs (03/14/2024)  Utilities: Not At Risk (03/14/2024)  Alcohol  Screen: Low Risk (05/04/2023)  Depression (PHQ2-9): Low Risk (01/03/2024)  Financial Resource Strain: Low Risk (12/01/2023)  Physical Activity: Insufficiently Active (12/01/2023)  Social Connections: Unknown (12/01/2023)  Stress: No Stress Concern  Present (12/01/2023)  Tobacco Use: Medium Risk (03/14/2024)   SDOH Interventions:     Readmission Risk Interventions    03/18/2024   11:45 AM 11/21/2023   10:35 AM 02/28/2023   11:30 AM  Readmission Risk Prevention Plan  Transportation Screening Complete Complete Complete  PCP or Specialist Appt within 5-7 Days  Complete   PCP or Specialist Appt within 3-5 Days Complete  Complete  Home Care Screening  Complete   Medication Review (RN CM)  Not Complete   HRI or Home Care Consult Complete  Complete  Social Work Consult for Recovery Care Planning/Counseling Complete  Complete  Palliative Care Screening Not Applicable  Not Applicable  Medication Review Oceanographer) Complete  Complete

## 2024-03-22 ENCOUNTER — Inpatient Hospital Stay: Payer: Self-pay | Admitting: Nurse Practitioner

## 2024-03-22 ENCOUNTER — Telehealth: Payer: Self-pay

## 2024-03-22 ENCOUNTER — Ambulatory Visit: Payer: Self-pay

## 2024-03-22 NOTE — Telephone Encounter (Signed)
FYI Raymond

## 2024-03-22 NOTE — Telephone Encounter (Signed)
 FYI Only or Action Required?: FYI only for provider: appointment scheduled on 03/22/24.  Patient was last seen in primary care on 01/03/2024 by Paseda, Folashade R, FNP.  Called Nurse Triage reporting Sickle Cell Pain Crisis.  Symptoms began several days ago.  Interventions attempted: Prescription medications: percocet.  Symptoms are: unchanged.  Triage Disposition: See HCP Within 4 Hours (Or PCP Triage)  Patient/caregiver understands and will follow disposition?: Yes Reason for Disposition  Painful joint is swollen  Answer Assessment - Initial Assessment Questions Pt reports he was discharged from the ED for right hip pain and edema secondary to sickle cell crisis that began on 03/13/24. Unable to clarify if pain is same/better/worse. Rates 9/10. Pt has percocet. States he used to be on 2 pain medications. Pt asking about resuming the long/short acting pain medication. Scheduled hosp f/u same day.   Pt to call if pain worsens or if CP or shortness of breath develop to advise ED or [facility]  1. ONSET: When did the pain begin?      03/13/24 2. LOCATION: Where does it hurt?      Right hip 3. SEVERITY: How bad is the pain?  (e.g., Scale 1-10; mild, moderate, or severe)     9/10 4. PATTERN: Is the pain constant? (e.g., yes, no; constant, intermittent)      Comes and goes 5. CAUSE:  What do you think is causing the pain? Is this pain similar to the acute pain attacks you have had in the past? (e.g., similar location, quality, intensity)     Sickle Cell 6. PAIN MEDICINES:      Percocet 7. OTHER SYMPTOMS: Do you have any other symptoms? (e.g., fever, chest pain, shortness of breath, new areas of swelling or redness)     Edema, denies CP or SOB  Protocols used: Sickle Cell Disease - Acute Pain Episode (Crisis)-A-AH  Message from Hadassah PARAS sent at 03/22/2024  9:41 AM EST  Reason for Triage: Pain and swelling in the right hip

## 2024-03-22 NOTE — Transitions of Care (Post Inpatient/ED Visit) (Signed)
" ° °  03/22/2024  Name: Timothy Marks MRN: [id number] DOB: [birth year]  Today's TOC FU Call Status: Today's TOC FU Call Status:: Unsuccessful Call (1st Attempt) Unsuccessful Call (1st Attempt) Date: 03/22/24  Attempted to reach the patient regarding the most recent Inpatient/ED visit.  Follow Up Plan: Additional outreach attempts will be made to reach the patient to complete the Transitions of Care (Post Inpatient/ED visit) call.   Arvin Seip RN, BSN, CCM [employer], Population Health Case Manager Phone: [phone]  "

## 2024-04-02 ENCOUNTER — Inpatient Hospital Stay: Payer: Self-pay | Admitting: Nurse Practitioner

## 2024-05-07 ENCOUNTER — Ambulatory Visit

## 2024-05-09 ENCOUNTER — Ambulatory Visit: Payer: Self-pay
